# Patient Record
Sex: Female | Born: 1935
Health system: Southern US, Community
[De-identification: ages and names within clinical notes are randomized; demographics above are authoritative.]

## PROBLEM LIST (undated history)

## (undated) DIAGNOSIS — K8020 Calculus of gallbladder without cholecystitis without obstruction: Secondary | ICD-10-CM

## (undated) DIAGNOSIS — N649 Disorder of breast, unspecified: Secondary | ICD-10-CM

## (undated) DIAGNOSIS — M1990 Unspecified osteoarthritis, unspecified site: Secondary | ICD-10-CM

## (undated) DIAGNOSIS — I2510 Atherosclerotic heart disease of native coronary artery without angina pectoris: Secondary | ICD-10-CM

## (undated) DIAGNOSIS — K219 Gastro-esophageal reflux disease without esophagitis: Secondary | ICD-10-CM

## (undated) DIAGNOSIS — B029 Zoster without complications: Secondary | ICD-10-CM

## (undated) DIAGNOSIS — K449 Diaphragmatic hernia without obstruction or gangrene: Secondary | ICD-10-CM

## (undated) DIAGNOSIS — M5137 Other intervertebral disc degeneration, lumbosacral region: Secondary | ICD-10-CM

## (undated) DIAGNOSIS — I700 Atherosclerosis of aorta: Secondary | ICD-10-CM

## (undated) DIAGNOSIS — I340 Nonrheumatic mitral (valve) insufficiency: Secondary | ICD-10-CM

## (undated) DIAGNOSIS — Z8619 Personal history of other infectious and parasitic diseases: Secondary | ICD-10-CM

## (undated) DIAGNOSIS — M8080XA Other osteoporosis with current pathological fracture, unspecified site, initial encounter for fracture: Secondary | ICD-10-CM

## (undated) DIAGNOSIS — F419 Anxiety disorder, unspecified: Secondary | ICD-10-CM

## (undated) DIAGNOSIS — I739 Peripheral vascular disease, unspecified: Secondary | ICD-10-CM

## (undated) DIAGNOSIS — I4819 Other persistent atrial fibrillation: Secondary | ICD-10-CM

## (undated) DIAGNOSIS — E785 Hyperlipidemia, unspecified: Secondary | ICD-10-CM

## (undated) DIAGNOSIS — M069 Rheumatoid arthritis, unspecified: Secondary | ICD-10-CM

## (undated) DIAGNOSIS — I10 Essential (primary) hypertension: Secondary | ICD-10-CM

## (undated) DIAGNOSIS — C4491 Basal cell carcinoma of skin, unspecified: Secondary | ICD-10-CM

## (undated) DIAGNOSIS — G2581 Restless legs syndrome: Secondary | ICD-10-CM

## (undated) DIAGNOSIS — R079 Chest pain, unspecified: Secondary | ICD-10-CM

## (undated) HISTORY — DX: Basal cell carcinoma of skin, unspecified: C44.91

## (undated) HISTORY — DX: Essential (primary) hypertension: I10

## (undated) HISTORY — DX: Chest pain, unspecified: R07.9

## (undated) HISTORY — DX: Other intervertebral disc degeneration, lumbosacral region: M51.37

## (undated) HISTORY — DX: Peripheral vascular disease, unspecified: I73.9

## (undated) HISTORY — PX: CARDIAC CATHETERIZATION: SHX172

## (undated) HISTORY — DX: Hyperlipidemia, unspecified: E78.5

## (undated) HISTORY — DX: Zoster without complications: B02.9

## (undated) HISTORY — PX: ABDOMINAL HYSTERECTOMY: SHX81

## (undated) HISTORY — DX: Diaphragmatic hernia without obstruction or gangrene: K44.9

## (undated) HISTORY — DX: Gastro-esophageal reflux disease without esophagitis: K21.9

## (undated) HISTORY — DX: Personal history of other infectious and parasitic diseases: Z86.19

## (undated) HISTORY — DX: Disorder of breast, unspecified: N64.9

## (undated) HISTORY — DX: Other osteoporosis with current pathological fracture, unspecified site, initial encounter for fracture: M80.80XA

## (undated) HISTORY — DX: Atherosclerotic heart disease of native coronary artery without angina pectoris: I25.10

## (undated) HISTORY — DX: Restless legs syndrome: G25.81

## (undated) HISTORY — DX: Calculus of gallbladder without cholecystitis without obstruction: K80.20

## (undated) HISTORY — DX: Nonrheumatic mitral (valve) insufficiency: I34.0

## (undated) HISTORY — DX: Atherosclerosis of aorta: I70.0

## (undated) HISTORY — DX: Anxiety disorder, unspecified: F41.9

## (undated) HISTORY — DX: Unspecified osteoarthritis, unspecified site: M19.90

## (undated) HISTORY — DX: Rheumatoid arthritis, unspecified: M06.9

## (undated) HISTORY — DX: Other persistent atrial fibrillation: I48.19

---

## 1987-08-09 ENCOUNTER — Encounter: Payer: Self-pay | Admitting: Family Medicine

## 1987-08-09 LAB — CONVERTED CEMR LAB: Pap Smear: NORMAL

## 1988-04-08 DIAGNOSIS — I10 Essential (primary) hypertension: Secondary | ICD-10-CM

## 1988-04-08 HISTORY — DX: Essential (primary) hypertension: I10

## 1989-08-21 DIAGNOSIS — M1990 Unspecified osteoarthritis, unspecified site: Secondary | ICD-10-CM

## 1989-08-21 HISTORY — DX: Unspecified osteoarthritis, unspecified site: M19.90

## 1990-04-08 HISTORY — PX: CERVICAL DISCECTOMY: SHX98

## 1991-01-06 ENCOUNTER — Encounter: Payer: Self-pay | Admitting: Family Medicine

## 1991-01-06 LAB — CONVERTED CEMR LAB: Pap Smear: NORMAL

## 1992-12-06 DIAGNOSIS — M51379 Other intervertebral disc degeneration, lumbosacral region without mention of lumbar back pain or lower extremity pain: Secondary | ICD-10-CM

## 1992-12-06 DIAGNOSIS — M5137 Other intervertebral disc degeneration, lumbosacral region: Secondary | ICD-10-CM

## 1992-12-06 HISTORY — DX: Other intervertebral disc degeneration, lumbosacral region without mention of lumbar back pain or lower extremity pain: M51.379

## 1992-12-06 HISTORY — DX: Other intervertebral disc degeneration, lumbosacral region: M51.37

## 1992-12-20 ENCOUNTER — Encounter: Payer: Self-pay | Admitting: Family Medicine

## 1997-09-08 ENCOUNTER — Emergency Department (HOSPITAL_COMMUNITY): Admission: EM | Admit: 1997-09-08 | Discharge: 1997-09-08 | Payer: Self-pay | Admitting: Emergency Medicine

## 1997-11-06 DIAGNOSIS — M8080XA Other osteoporosis with current pathological fracture, unspecified site, initial encounter for fracture: Secondary | ICD-10-CM

## 1997-11-06 HISTORY — DX: Other osteoporosis with current pathological fracture, unspecified site, initial encounter for fracture: M80.80XA

## 1999-05-10 DIAGNOSIS — K219 Gastro-esophageal reflux disease without esophagitis: Secondary | ICD-10-CM

## 1999-05-10 HISTORY — DX: Gastro-esophageal reflux disease without esophagitis: K21.9

## 1999-05-16 ENCOUNTER — Encounter: Payer: Self-pay | Admitting: Family Medicine

## 1999-05-16 LAB — CONVERTED CEMR LAB: Blood Glucose, Fasting: 83 mg/dL

## 1999-05-23 ENCOUNTER — Other Ambulatory Visit: Admission: RE | Admit: 1999-05-23 | Discharge: 1999-05-23 | Payer: Self-pay | Admitting: Family Medicine

## 1999-05-28 ENCOUNTER — Encounter: Payer: Self-pay | Admitting: Family Medicine

## 1999-09-12 ENCOUNTER — Encounter: Payer: Self-pay | Admitting: Family Medicine

## 1999-09-12 ENCOUNTER — Encounter: Admission: RE | Admit: 1999-09-12 | Discharge: 1999-09-12 | Payer: Self-pay | Admitting: Family Medicine

## 2000-01-09 ENCOUNTER — Encounter: Payer: Self-pay | Admitting: Family Medicine

## 2000-01-09 LAB — CONVERTED CEMR LAB: Blood Glucose, Fasting: 82 mg/dL

## 2000-07-14 ENCOUNTER — Encounter: Payer: Self-pay | Admitting: Family Medicine

## 2000-09-17 ENCOUNTER — Encounter: Admission: RE | Admit: 2000-09-17 | Discharge: 2000-09-17 | Payer: Self-pay | Admitting: Family Medicine

## 2000-09-17 ENCOUNTER — Encounter: Payer: Self-pay | Admitting: Family Medicine

## 2002-01-01 HISTORY — PX: ESOPHAGOGASTRODUODENOSCOPY: SHX1529

## 2002-08-20 ENCOUNTER — Encounter: Payer: Self-pay | Admitting: Family Medicine

## 2002-08-20 LAB — CONVERTED CEMR LAB
Blood Glucose, Fasting: 103 mg/dL
TSH: 2.1 microintl units/mL

## 2002-08-26 ENCOUNTER — Encounter: Payer: Self-pay | Admitting: Family Medicine

## 2002-08-26 ENCOUNTER — Encounter: Admission: RE | Admit: 2002-08-26 | Discharge: 2002-08-26 | Payer: Self-pay | Admitting: Family Medicine

## 2002-08-27 LAB — HM DEXA SCAN

## 2003-12-08 DIAGNOSIS — E785 Hyperlipidemia, unspecified: Secondary | ICD-10-CM

## 2003-12-08 HISTORY — DX: Hyperlipidemia, unspecified: E78.5

## 2003-12-27 ENCOUNTER — Encounter: Payer: Self-pay | Admitting: Family Medicine

## 2003-12-27 LAB — CONVERTED CEMR LAB: TSH: 1.25 microintl units/mL

## 2004-02-02 ENCOUNTER — Encounter: Admission: RE | Admit: 2004-02-02 | Discharge: 2004-02-02 | Payer: Self-pay | Admitting: Family Medicine

## 2004-02-07 ENCOUNTER — Encounter: Admission: RE | Admit: 2004-02-07 | Discharge: 2004-02-07 | Payer: Self-pay | Admitting: Family Medicine

## 2004-02-22 ENCOUNTER — Ambulatory Visit (HOSPITAL_COMMUNITY): Admission: RE | Admit: 2004-02-22 | Discharge: 2004-02-22 | Payer: Self-pay | Admitting: Gastroenterology

## 2004-02-22 ENCOUNTER — Ambulatory Visit: Payer: Self-pay | Admitting: Gastroenterology

## 2004-04-17 ENCOUNTER — Ambulatory Visit: Payer: Self-pay | Admitting: Family Medicine

## 2004-04-20 ENCOUNTER — Ambulatory Visit: Payer: Self-pay | Admitting: Family Medicine

## 2004-04-23 HISTORY — PX: OTHER SURGICAL HISTORY: SHX169

## 2004-04-23 HISTORY — PX: ESOPHAGOGASTRODUODENOSCOPY: SHX1529

## 2004-11-12 ENCOUNTER — Ambulatory Visit: Payer: Self-pay | Admitting: Family Medicine

## 2004-12-14 ENCOUNTER — Ambulatory Visit: Payer: Self-pay | Admitting: Family Medicine

## 2005-05-16 ENCOUNTER — Ambulatory Visit: Payer: Self-pay | Admitting: Family Medicine

## 2005-05-17 ENCOUNTER — Encounter: Payer: Self-pay | Admitting: Family Medicine

## 2005-05-17 LAB — CONVERTED CEMR LAB: Blood Glucose, Fasting: 97 mg/dL

## 2005-06-03 ENCOUNTER — Ambulatory Visit: Payer: Self-pay | Admitting: Family Medicine

## 2005-06-03 LAB — CONVERTED CEMR LAB: TSH: 0.98 microintl units/mL

## 2005-06-05 ENCOUNTER — Encounter: Payer: Self-pay | Admitting: Family Medicine

## 2005-06-05 ENCOUNTER — Other Ambulatory Visit: Admission: RE | Admit: 2005-06-05 | Discharge: 2005-06-05 | Payer: Self-pay | Admitting: Family Medicine

## 2005-06-05 ENCOUNTER — Ambulatory Visit: Payer: Self-pay | Admitting: Family Medicine

## 2005-06-05 LAB — CONVERTED CEMR LAB: Pap Smear: NORMAL

## 2005-06-13 ENCOUNTER — Encounter: Admission: RE | Admit: 2005-06-13 | Discharge: 2005-06-13 | Payer: Self-pay | Admitting: Family Medicine

## 2005-07-03 ENCOUNTER — Ambulatory Visit: Payer: Self-pay | Admitting: Family Medicine

## 2005-07-19 ENCOUNTER — Encounter: Admission: RE | Admit: 2005-07-19 | Discharge: 2005-07-19 | Payer: Self-pay | Admitting: Family Medicine

## 2005-09-03 ENCOUNTER — Ambulatory Visit: Payer: Self-pay | Admitting: Family Medicine

## 2006-06-03 ENCOUNTER — Ambulatory Visit: Payer: Self-pay | Admitting: Family Medicine

## 2006-06-03 LAB — CONVERTED CEMR LAB
ALT: 13 units/L (ref 0–40)
Albumin: 3.8 g/dL (ref 3.5–5.2)
Alkaline Phosphatase: 55 units/L (ref 39–117)
BUN: 7 mg/dL (ref 6–23)
Blood Glucose, Fasting: 97 mg/dL
CO2: 31 meq/L (ref 19–32)
Calcium: 9.3 mg/dL (ref 8.4–10.5)
Chloride: 103 meq/L (ref 96–112)
Cholesterol: 230 mg/dL (ref 0–200)
Eosinophils Absolute: 0.2 10*3/uL (ref 0.0–0.6)
Eosinophils Relative: 5.1 % — ABNORMAL HIGH (ref 0.0–5.0)
HCT: 39.9 % (ref 36.0–46.0)
Hemoglobin: 13.5 g/dL (ref 12.0–15.0)
MCHC: 33.8 g/dL (ref 30.0–36.0)
Monocytes Absolute: 0.5 10*3/uL (ref 0.2–0.7)
Neutro Abs: 2.1 10*3/uL (ref 1.4–7.7)
RBC count: 4.41 10*6/uL
RBC: 4.41 M/uL (ref 3.87–5.11)
RDW: 11.9 % (ref 11.5–14.6)
Total CHOL/HDL Ratio: 4.2
Total Protein: 6.8 g/dL (ref 6.0–8.3)
Triglycerides: 235 mg/dL (ref 0–149)

## 2006-06-05 ENCOUNTER — Encounter: Payer: Self-pay | Admitting: Family Medicine

## 2006-06-05 LAB — CONVERTED CEMR LAB: WBC, blood: 4.9 10*3/uL

## 2006-06-10 ENCOUNTER — Ambulatory Visit: Payer: Self-pay | Admitting: Family Medicine

## 2006-06-17 ENCOUNTER — Encounter: Admission: RE | Admit: 2006-06-17 | Discharge: 2006-06-17 | Payer: Self-pay | Admitting: Family Medicine

## 2006-06-30 ENCOUNTER — Ambulatory Visit: Payer: Self-pay | Admitting: Family Medicine

## 2006-07-01 ENCOUNTER — Ambulatory Visit: Payer: Self-pay | Admitting: Family Medicine

## 2006-11-24 ENCOUNTER — Encounter: Payer: Self-pay | Admitting: Family Medicine

## 2006-11-24 DIAGNOSIS — K219 Gastro-esophageal reflux disease without esophagitis: Secondary | ICD-10-CM | POA: Insufficient documentation

## 2006-11-24 DIAGNOSIS — K259 Gastric ulcer, unspecified as acute or chronic, without hemorrhage or perforation: Secondary | ICD-10-CM | POA: Insufficient documentation

## 2006-11-24 DIAGNOSIS — G4761 Periodic limb movement disorder: Secondary | ICD-10-CM

## 2006-11-24 DIAGNOSIS — M8000XA Age-related osteoporosis with current pathological fracture, unspecified site, initial encounter for fracture: Secondary | ICD-10-CM

## 2006-11-24 DIAGNOSIS — M1990 Unspecified osteoarthritis, unspecified site: Secondary | ICD-10-CM

## 2006-11-24 DIAGNOSIS — I10 Essential (primary) hypertension: Secondary | ICD-10-CM

## 2006-11-24 DIAGNOSIS — E785 Hyperlipidemia, unspecified: Secondary | ICD-10-CM

## 2007-04-09 DIAGNOSIS — Z8619 Personal history of other infectious and parasitic diseases: Secondary | ICD-10-CM

## 2007-04-09 HISTORY — DX: Personal history of other infectious and parasitic diseases: Z86.19

## 2007-04-12 ENCOUNTER — Ambulatory Visit: Payer: Self-pay | Admitting: Family Medicine

## 2007-04-13 ENCOUNTER — Ambulatory Visit: Payer: Self-pay | Admitting: Family Medicine

## 2007-04-13 DIAGNOSIS — B029 Zoster without complications: Secondary | ICD-10-CM | POA: Insufficient documentation

## 2007-04-13 HISTORY — DX: Zoster without complications: B02.9

## 2007-04-20 ENCOUNTER — Ambulatory Visit: Payer: Self-pay | Admitting: Family Medicine

## 2007-04-20 DIAGNOSIS — B0229 Other postherpetic nervous system involvement: Secondary | ICD-10-CM

## 2007-04-20 HISTORY — DX: Other postherpetic nervous system involvement: B02.29

## 2007-05-01 ENCOUNTER — Telehealth (INDEPENDENT_AMBULATORY_CARE_PROVIDER_SITE_OTHER): Payer: Self-pay | Admitting: Internal Medicine

## 2007-05-04 ENCOUNTER — Telehealth (INDEPENDENT_AMBULATORY_CARE_PROVIDER_SITE_OTHER): Payer: Self-pay | Admitting: *Deleted

## 2007-05-11 ENCOUNTER — Ambulatory Visit: Payer: Self-pay | Admitting: Family Medicine

## 2007-05-26 ENCOUNTER — Ambulatory Visit: Payer: Self-pay | Admitting: Family Medicine

## 2007-07-13 ENCOUNTER — Telehealth: Payer: Self-pay | Admitting: Family Medicine

## 2007-07-16 ENCOUNTER — Ambulatory Visit: Payer: Self-pay | Admitting: Family Medicine

## 2007-08-28 ENCOUNTER — Ambulatory Visit: Payer: Self-pay | Admitting: Family Medicine

## 2007-08-28 ENCOUNTER — Encounter (INDEPENDENT_AMBULATORY_CARE_PROVIDER_SITE_OTHER): Payer: Self-pay | Admitting: *Deleted

## 2007-09-23 ENCOUNTER — Ambulatory Visit: Payer: Self-pay | Admitting: Family Medicine

## 2007-09-23 LAB — CONVERTED CEMR LAB
BUN: 8 mg/dL (ref 6–23)
Basophils Absolute: 0 10*3/uL (ref 0.0–0.1)
CO2: 32 meq/L (ref 19–32)
Calcium: 9.6 mg/dL (ref 8.4–10.5)
Chloride: 103 meq/L (ref 96–112)
Creatinine, Ser: 0.8 mg/dL (ref 0.4–1.2)
Eosinophils Absolute: 0.3 10*3/uL (ref 0.0–0.7)
GFR calc Af Amer: 90 mL/min
Glucose, Bld: 102 mg/dL — ABNORMAL HIGH (ref 70–99)
HCT: 40.5 % (ref 36.0–46.0)
HDL: 56.2 mg/dL (ref >39.0)
Hemoglobin: 13.8 g/dL (ref 12.0–15.0)
MCHC: 34.1 g/dL (ref 30.0–36.0)
MCV: 92.6 fL (ref 78.0–100.0)
RBC: 4.38 M/uL (ref 3.87–5.11)
TSH: 1.65 microintl units/mL (ref 0.35–5.50)
Total Bilirubin: 0.6 mg/dL (ref 0.3–1.2)
Total Protein: 7 g/dL (ref 6.0–8.3)
Triglycerides: 81 mg/dL (ref 0–149)

## 2007-09-29 ENCOUNTER — Ambulatory Visit: Payer: Self-pay | Admitting: Family Medicine

## 2007-12-10 ENCOUNTER — Ambulatory Visit: Payer: Self-pay | Admitting: Family Medicine

## 2007-12-10 DIAGNOSIS — F339 Major depressive disorder, recurrent, unspecified: Secondary | ICD-10-CM | POA: Insufficient documentation

## 2007-12-23 ENCOUNTER — Ambulatory Visit: Payer: Self-pay | Admitting: Family Medicine

## 2007-12-23 LAB — CONVERTED CEMR LAB
Basophils Absolute: 0.1 10*3/uL (ref 0.0–0.1)
Eosinophils Absolute: 0.2 10*3/uL (ref 0.0–0.7)
Eosinophils Relative: 4.7 % (ref 0.0–5.0)
Lymphocytes Relative: 37.5 % (ref 12.0–46.0)
Monocytes Absolute: 0.5 10*3/uL (ref 0.1–1.0)
Neutro Abs: 2.3 10*3/uL (ref 1.4–7.7)
Neutrophils Relative %: 47.4 % (ref 43.0–77.0)
Vitamin B-12: 286 pg/mL (ref 211–911)

## 2007-12-24 LAB — CONVERTED CEMR LAB: Vit D, 1,25-Dihydroxy: 29 — ABNORMAL LOW (ref 30–89)

## 2007-12-29 ENCOUNTER — Ambulatory Visit: Payer: Self-pay | Admitting: Family Medicine

## 2008-01-05 ENCOUNTER — Telehealth: Payer: Self-pay | Admitting: Family Medicine

## 2008-01-23 ENCOUNTER — Telehealth: Payer: Self-pay | Admitting: Internal Medicine

## 2008-02-29 ENCOUNTER — Ambulatory Visit: Payer: Self-pay | Admitting: Family Medicine

## 2008-03-15 ENCOUNTER — Telehealth: Payer: Self-pay | Admitting: Family Medicine

## 2008-08-08 ENCOUNTER — Telehealth: Payer: Self-pay | Admitting: Family Medicine

## 2008-12-14 ENCOUNTER — Ambulatory Visit: Payer: Self-pay | Admitting: Family Medicine

## 2008-12-14 LAB — CONVERTED CEMR LAB
AST: 19 units/L (ref 0–37)
Basophils Absolute: 0 10*3/uL (ref 0.0–0.1)
Bilirubin, Direct: 0 mg/dL (ref 0.0–0.3)
Chloride: 104 meq/L (ref 96–112)
Cholesterol: 223 mg/dL — ABNORMAL HIGH (ref 0–200)
Creatinine, Ser: 0.8 mg/dL (ref 0.4–1.2)
Direct LDL: 119.2 mg/dL
Eosinophils Absolute: 0.3 10*3/uL (ref 0.0–0.7)
HCT: 40 % (ref 36.0–46.0)
Iron: 54 ug/dL (ref 42–145)
Lymphs Abs: 2.1 10*3/uL (ref 0.7–4.0)
MCV: 93.4 fL (ref 78.0–100.0)
Neutrophils Relative %: 40.9 % — ABNORMAL LOW (ref 43.0–77.0)
Sodium: 142 meq/L (ref 135–145)
TSH: 1.51 microintl units/mL (ref 0.35–5.50)
Total Bilirubin: 0.7 mg/dL (ref 0.3–1.2)
Triglycerides: 242 mg/dL — ABNORMAL HIGH (ref 0.0–149.0)
VLDL: 48.4 mg/dL — ABNORMAL HIGH (ref 0.0–40.0)
WBC: 4.9 10*3/uL (ref 4.5–10.5)

## 2008-12-16 LAB — CONVERTED CEMR LAB: Vit D, 25-Hydroxy: 28 ng/mL — ABNORMAL LOW (ref 30–89)

## 2008-12-22 ENCOUNTER — Telehealth (INDEPENDENT_AMBULATORY_CARE_PROVIDER_SITE_OTHER): Payer: Self-pay | Admitting: Internal Medicine

## 2009-01-03 ENCOUNTER — Ambulatory Visit: Payer: Self-pay | Admitting: Family Medicine

## 2009-02-02 ENCOUNTER — Ambulatory Visit: Payer: Self-pay | Admitting: Family Medicine

## 2009-02-02 ENCOUNTER — Encounter (INDEPENDENT_AMBULATORY_CARE_PROVIDER_SITE_OTHER): Payer: Self-pay | Admitting: *Deleted

## 2009-02-02 LAB — CONVERTED CEMR LAB: OCCULT 2: NEGATIVE

## 2009-07-12 ENCOUNTER — Ambulatory Visit: Payer: Self-pay | Admitting: Family Medicine

## 2009-10-30 ENCOUNTER — Telehealth: Payer: Self-pay | Admitting: Family Medicine

## 2009-10-31 ENCOUNTER — Ambulatory Visit: Payer: Self-pay | Admitting: Family Medicine

## 2009-11-14 ENCOUNTER — Encounter (INDEPENDENT_AMBULATORY_CARE_PROVIDER_SITE_OTHER): Payer: Self-pay | Admitting: *Deleted

## 2009-12-18 ENCOUNTER — Ambulatory Visit: Payer: Self-pay | Admitting: Family Medicine

## 2009-12-18 DIAGNOSIS — M5136 Other intervertebral disc degeneration, lumbar region: Secondary | ICD-10-CM

## 2010-01-19 ENCOUNTER — Telehealth: Payer: Self-pay | Admitting: Family Medicine

## 2010-01-22 ENCOUNTER — Telehealth: Payer: Self-pay | Admitting: Family Medicine

## 2010-02-19 ENCOUNTER — Ambulatory Visit: Payer: Self-pay | Admitting: Family Medicine

## 2010-02-20 ENCOUNTER — Telehealth (INDEPENDENT_AMBULATORY_CARE_PROVIDER_SITE_OTHER): Payer: Self-pay | Admitting: *Deleted

## 2010-02-20 LAB — CONVERTED CEMR LAB
AST: 24 units/L (ref 0–37)
Albumin: 4.3 g/dL (ref 3.5–5.2)
Alkaline Phosphatase: 65 units/L (ref 39–117)
BUN: 13 mg/dL (ref 6–23)
Basophils Absolute: 0 10*3/uL (ref 0.0–0.1)
Basophils Relative: 0.7 % (ref 0.0–3.0)
CO2: 29 meq/L (ref 19–32)
Chloride: 102 meq/L (ref 96–112)
Cholesterol: 267 mg/dL — ABNORMAL HIGH (ref 0–200)
Direct LDL: 173.1 mg/dL
Eosinophils Relative: 6 % — ABNORMAL HIGH (ref 0.0–5.0)
HCT: 43.1 % (ref 36.0–46.0)
Hemoglobin: 14.8 g/dL (ref 12.0–15.0)
Lymphs Abs: 2.1 10*3/uL (ref 0.7–4.0)
MCV: 93.1 fL (ref 78.0–100.0)
Monocytes Absolute: 0.6 10*3/uL (ref 0.1–1.0)
Platelets: 299 10*3/uL (ref 150.0–400.0)
Potassium: 3.8 meq/L (ref 3.5–5.1)
RBC: 4.64 M/uL (ref 3.87–5.11)
Sodium: 139 meq/L (ref 135–145)
TSH: 1.51 microintl units/mL (ref 0.35–5.50)
Total Bilirubin: 0.7 mg/dL (ref 0.3–1.2)
Total CHOL/HDL Ratio: 4
Total Protein: 7 g/dL (ref 6.0–8.3)
Triglycerides: 91 mg/dL (ref 0.0–149.0)
Vit D, 25-Hydroxy: 38 ng/mL (ref 30–89)

## 2010-02-22 ENCOUNTER — Ambulatory Visit: Payer: Self-pay | Admitting: Family Medicine

## 2010-03-06 ENCOUNTER — Ambulatory Visit: Payer: Self-pay | Admitting: Family Medicine

## 2010-03-06 LAB — FECAL OCCULT BLOOD, GUAIAC: Fecal Occult Blood: NEGATIVE

## 2010-03-06 LAB — CONVERTED CEMR LAB: Fecal Occult Bld: NEGATIVE

## 2010-03-07 ENCOUNTER — Encounter: Admission: RE | Admit: 2010-03-07 | Discharge: 2010-03-07 | Payer: Self-pay | Admitting: Family Medicine

## 2010-03-07 LAB — HM MAMMOGRAPHY: HM Mammogram: NORMAL

## 2010-03-23 ENCOUNTER — Telehealth: Payer: Self-pay | Admitting: Family Medicine

## 2010-04-04 ENCOUNTER — Ambulatory Visit: Payer: Self-pay | Admitting: Family Medicine

## 2010-04-04 LAB — CONVERTED CEMR LAB: ALT: 20 units/L (ref 0–35)

## 2010-04-29 ENCOUNTER — Encounter: Payer: Self-pay | Admitting: Family Medicine

## 2010-05-09 HISTORY — PX: CATARACT EXTRACTION: SUR2

## 2010-05-10 NOTE — Assessment & Plan Note (Signed)
Summary: CPX/CLE   Vital Signs:  Patient profile:   77 year old female Weight:      151.75 pounds Temp:     97.4 degrees F oral Pulse rate:   80 / minute Pulse rhythm:   regular BP sitting:   134 / 84  (left arm) Cuff size:   regular  Vitals Entered By: Sydell Axon LPN (February 22, 2010 1:55 PM) CC: 30 minute checkup, has had a hysterectomy   History of Present Illness: Pt here for Comp Exam. She complains of not sleeping well. She is stressed out, has a best friend who was just found to have cancer, her heater in the home needs replacing...another 500 dollars. Nephew died at 48 yoa weith kidney and pancreatic disease. Her back is improving. She is swallowing well.  Preventive Screening-Counseling & Management  Alcohol-Tobacco     Alcohol drinks/day: <1     Alcohol type: rare occas wine     Smoking Status: never     Passive Smoke Exposure: no  Caffeine-Diet-Exercise     Caffeine use/day: 2     Does Patient Exercise: yes     Type of exercise: works hard, walks     Times/week: 7  Problems Prior to Update: 1)  Back Pain  (ICD-724.5) 2)  Hoarseness Since 10/10  (ICD-784.42) 3)  Leg Pain, Bilateral, Nighttime  (ICD-729.5) 4)  Other Malaise and Fatigue  (ICD-780.79) 5)  Depressive Disorder  (ICD-311) 6)  Health Maintenance Exam  (ICD-V70.0) 7)  Special Screening Malig Neoplasms Other Sites  (ICD-V76.49) 8)  Postherpetic Neuralgia  (ICD-053.19) 9)  Herpes Zoster  (ICD-053.9) 10)  Periodic Limb Movement Disorder  (ICD-333.99) 11)  Gastric Ulcer  (ICD-531.90) 12)  Osteoporosis  (ICD-733.00) 13)  Osteoarthritis  (ICD-715.90) 14)  Hypertension  (ICD-401.9) 15)  Hyperlipidemia  (ICD-272.4) 16)  Gerd  (ICD-530.81)  Medications Prior to Update: 1)  Carbidopa-Levodopa 25-100 Mg Tabs (Carbidopa-Levodopa) .Marland Kitchen.. 1 Daily By Mouth 2)  Hydrochlorothiazide 12.5 Mg Tabs (Hydrochlorothiazide) .... Take 1 Tablet By Mouth Once A Day 3)  Amlodipine Besylate 10 Mg Tabs (Amlodipine  Besylate) .Marland Kitchen.. 1 Daily By Mouth 4)  Pantoprazole Sodium 40 Mg Tbec (Pantoprazole Sodium) .... Take 1 Tablet By Mouth Once A Day 5)  Calcium 500/d 500-200 Mg-Unit  Tabs (Calcium Carbonate-Vitamin D) .Marland KitchenMarland KitchenMarland Kitchen 1twice A Day By Mouth 6)  Fish Oil 1200 Mg Caps (Omega-3 Fatty Acids) .Marland Kitchen.. 1 Tablet Twice A Day 7)  Acyclovir 400 Mg Tabs (Acyclovir) .Marland Kitchen.. 1 Tablet Twice A Day By Mouth 8)  Glucosamine Chondr 1500 Complx  Caps (Glucosamine-Chondroit-Vit C-Mn) .Marland Kitchen.. 1 Daily By Mouth 9)  Omnipred 1 % Susp (Prednisolone Acetate) .... As Needed As Directed 10)  Vitamin D 1000 Unit Caps (Cholecalciferol) .Marland Kitchen.. 1 Tablet Twice A Day 11)  Cvs Allergy Relief 10 Mg Tabs (Loratadine) .... Take 1 Tablet By Mouth Once A Day 12)  Ibuprofen 200 Mg Tabs (Ibuprofen) .... 3-4 By Mouth With Food Up To Three Times A Day As Needed Back Pain 13)  B-Caro-T 15 Mg Caps (Beta Carotene) .... Take 1 Capsule By Mouth Once A Day 14)  Venlafaxine Hcl 37.5 Mg Tabs (Venlafaxine Hcl) .Marland Kitchen.. 1 By Mouth Once Daily For 1 Week Then Two Times A Day 15)  Flexeril 10 Mg Tabs (Cyclobenzaprine Hcl) .Marland Kitchen.. 1 By Mouth Up To Three Times A Day As Needed Back Pain  Watch Out For Sedation  Current Medications (verified): 1)  Carbidopa-Levodopa 25-100 Mg Tabs (Carbidopa-Levodopa) .Marland Kitchen.. 1 Daily By Mouth 2)  Hydrochlorothiazide  12.5 Mg Tabs (Hydrochlorothiazide) .... Take 1 Tablet By Mouth Once A Day 3)  Amlodipine Besylate 10 Mg Tabs (Amlodipine Besylate) .Marland Kitchen.. 1 Daily By Mouth 4)  Pantoprazole Sodium 40 Mg Tbec (Pantoprazole Sodium) .... Take 1 Tablet By Mouth Once A Day 5)  Calcium 500/d 500-200 Mg-Unit  Tabs (Calcium Carbonate-Vitamin D) .Marland KitchenMarland KitchenMarland Kitchen 1twice A Day By Mouth 6)  Fish Oil 1200 Mg Caps (Omega-3 Fatty Acids) .Marland Kitchen.. 1 Tablet Twice A Day 7)  Acyclovir 400 Mg Tabs (Acyclovir) .Marland Kitchen.. 1 Tablet Daily By Mouth 8)  Glucosamine Chondr 1500 Complx  Caps (Glucosamine-Chondroit-Vit C-Mn) .Marland Kitchen.. 1 Daily By Mouth 9)  Omnipred 1 % Susp (Prednisolone Acetate) .... As Needed As  Directed 10)  Vitamin D 1000 Unit Caps (Cholecalciferol) .Marland Kitchen.. 1 Tablet Twice A Day 11)  Cvs Allergy Relief 10 Mg Tabs (Loratadine) .... Take 1 Tablet By Mouth Once A Day 12)  Ibuprofen 200 Mg Tabs (Ibuprofen) .... 3-4 By Mouth With Food Up To Three Times A Day As Needed Back Pain 13)  B-Caro-T 15 Mg Caps (Beta Carotene) .... Take 1 Capsule By Mouth Once A Day 14)  Venlafaxine Hcl 37.5 Mg Tabs (Venlafaxine Hcl) .... Take By Mouth  Two Times A Day 15)  Flexeril 10 Mg Tabs (Cyclobenzaprine Hcl) .Marland Kitchen.. 1 By Mouth Up To Three Times A Day As Needed Back Pain  Watch Out For Sedation 16)  Melatonin 3 Mg Tabs (Melatonin) .... Take By Mouth At Bedtime 17)  Aleve 220 Mg Tabs (Naproxen Sodium) .... As Needed  Allergies: 1)  ! Penicillin  Past History:  Past Medical History: Last updated: 11/24/2006 GERD:05/1999 Hyperlipidemia :12/2003 Hypertension: 1990 Osteoarthritis:  08/21/1989 Osteoporosis 11/1997  Past Surgical History: Last updated: 11/24/2006 DEGENERATIVE DISC DISEASE L/S SPINE :(12/06/1992) HYSTERECTOMY AT AGE 7:// S/P ZOX(0960) NECK DISKECTOMY / FUSION:(DR BOTERO) (1992) EGD WITH ULCER; BX NEGATIVE; +STRICTURE GASTRIC ULCER WITH HEMMORAGE:(01/01/2002) DEXA- OSTEOPENIA MILDLY WORSENED SINCE 11/1997:(08/27/2002) EGD ESOPH STRICTURE -- DILATED:(04/23/2004) ABD. ULTRASOUND , GALLSTONES:(04/23/2004)  Family History: Last updated: Mar 19, 2010 Father: DECEASED AT 100YOA MI; BLACK LUNG DISEASE Mother:  DECEASED 73 YOA EMPHYSEMA, LUNG DISEASE Siblings: 2 BROTHERS ALL HAVE HTN , ONE WITH HEART               PROBLEMS:ONE DECEASED FROM CVA 1 SISTER WHO HAD CVA AT AGE 86 AND DIED AT 66 YOA, + DM CV: + BROTHER HEART VALVE HTN: + BROTHERS DM+ SISTER GOUT/ARTHRITIS: CANCER: NEGATIVE COLON AND BREAST? 1 BROTHER BRAIN TUMOR/ 1 BROTHER ESOPHAGUS CANCER  Social History: Last updated: 10/31/2009 Marital Status: Widowed 01/1999 ; 1ST MARRIAGE 25 YEARS/ 2ND MARRIAGE 13 YEARS Children: 4 SONS    1DECEASED 10/09 / 1 DAUGHTER Occupation: retired from [employer] From [place]  Risk Factors: Alcohol Use: <1 (03/19/10) Caffeine Use: 2 (2010/03/19) Exercise: yes (19-Mar-2010)  Risk Factors: Smoking Status: never (03-19-2010) Passive Smoke Exposure: no (Mar 19, 2010)  Family History: Father: DECEASED AT 76YOA MI; BLACK LUNG DISEASE Mother:  DECEASED 10 YOA EMPHYSEMA, LUNG DISEASE Siblings: 2 BROTHERS ALL HAVE HTN , ONE WITH HEART               PROBLEMS:ONE DECEASED FROM CVA 1 SISTER WHO HAD CVA AT AGE 86 AND DIED AT 24 YOA, + DM CV: + BROTHER HEART VALVE HTN: + BROTHERS DM+ SISTER GOUT/ARTHRITIS: CANCER: NEGATIVE COLON AND BREAST? 1 BROTHER BRAIN TUMOR/ 1 BROTHER ESOPHAGUS CANCER  Review of Systems General:  Denies chills, fatigue, fever, sweats, weakness, and weight loss. Eyes:  Denies blurring, discharge, and eye pain; h/o shingles. ENT:  Denies decreased hearing, ear discharge, earache, and ringing in ears. CV:  Complains of palpitations and shortness of breath with exertion; denies chest pain or discomfort, fainting, fatigue, swelling of feet, and swelling of hands; rare. Resp:  Denies cough, shortness of breath, and wheezing. GI:  Denies abdominal pain, bloody stools, change in bowel habits, constipation, dark tarry stools, diarrhea, indigestion, loss of appetite, nausea, vomiting, vomiting blood, and yellowish skin color. GU:  Complains of nocturia; denies discharge, dysuria, and urinary frequency. MS:  Complains of low back pain; denies joint pain, muscle aches, cramps, and stiffness; acute. Derm:  Denies dryness, itching, and rash. Neuro:  Denies numbness, poor balance, tingling, and tremors.  Physical Exam  General:  Well-developed,well-nourished,in no acute distress; alert,appropriate and cooperative throughout examination Head:  Normocephalic and atraumatic without obvious abnormalities. No apparent alopecia or balding. Eyes:  Conjunctiva clear bilaterally.  Ears:   External ear exam shows no significant lesions or deformities.  Otoscopic examination reveals clear canals, tympanic membranes are intact bilaterally without bulging, retraction, inflammation or discharge. Hearing is grossly normal bilaterally. Nose:  External nasal examination shows no deformity or inflammation. Nasal mucosa are pink and moist without lesions or exudates. Mouth:  pharynx pink and moist.   Neck:  No deformities, masses, or tenderness noted. nl rom  Chest Wall:  No deformities, masses, or tenderness noted. Breasts:  No mass, nodules, thickening, tenderness, bulging, retraction, inflamation, nipple discharge or skin changes noted.   Lungs:  Normal respiratory effort, chest expands symmetrically. Lungs are clear to auscultation, no crackles or wheezes. Heart:  Normal rate and regular rhythm. S1 and S2 normal without gallop, murmur, click, rub or other extra sounds. Abdomen:  no suprapubic tenderness or fullness felt  Rectal:  No external abnormalities noted. Normal sphincter tone. No rectal masses or tenderness. G neg. Genitalia:  Bimanual only done Introitus wnl, Uterus and Cervix absent, Adnexa nontender w/o mass, ovaries not felt.  Msk:  no LS tenderness nl rom hips and knees labored gait   Pulses:  R and L carotid,radial,femoral,dorsalis pedis and posterior tibial pulses are full and equal bilaterally Extremities:  No clubbing, cyanosis, edema, or deformity noted with normal full range of motion of all joints.   Neurologic:  strength normal in all extremities, sensation intact to light touch, and gait normal.   Skin:  Intact without suspicious lesions or rashes Cervical Nodes:  No lymphadenopathy noted Axillary Nodes:  No palpable lymphadenopathy Inguinal Nodes:  No significant adenopathy Psych:  normal affect, talkative and pleasant    Impression & Recommendations:  Problem # 1:  HEALTH MAINTENANCE EXAM (ICD-V70.0) I have personally reviewed the Medicare Annual  Wellness questionnaire and have noted 1.   The patient's medical and social history 2.   Their use of alcohol, tobacco or illicit drugs 3.   Their current medications and supplements 4.   The patient's functional ability including ADL's, fall risks, home safety risks and hearing or visual             impairment. 5.   Diet and physical activities 6.   Evidence for depression or mood disorders  Problem # 2:  BACK PAIN (ICD-724.5) Assessment: Improved  The following medications were removed from the medication list:    Ibuprofen 200 Mg Tabs (Ibuprofen) .Marland Kitchen... 3-4 by mouth with food up to three times a day as needed back pain Her updated medication list for this problem includes:    Flexeril 10 Mg Tabs (Cyclobenzaprine hcl) .Marland Kitchen... 1 by mouth up  to three times a day as needed back pain  watch out for sedation    Aleve 220 Mg Tabs (Naproxen sodium) .Marland Kitchen... 2 tabs after brfst and supper as needed  Discussed use of moist heat or ice, modified activities, medications, and stretching/strengthening exercises. Back care instructions given. To be seen in 2 weeks if no improvement; sooner if worsening of symptoms.   Problem # 3:  LEG PAIN, BILATERAL, NIGHTTIME (ICD-729.5) Stable, unable to afford Requip or Neurontin.  Problem # 4:  DEPRESSIVE DISORDER (ICD-311) Assessment: Improved Brighter and in better spirits today. Her updated medication list for this problem includes:    Venlafaxine Hcl 37.5 Mg Tabs (Venlafaxine hcl) .Marland Kitchen... Take by mouth  two times a day  Problem # 5:  POSTHERPETIC NEURALGIA (ICD-053.19) Assessment: Improved Left with itching of left eyebrow.  Problem # 6:  HYPERTENSION (ICD-401.9) Assessment: Unchanged Stable. Her updated medication list for this problem includes:    Hydrochlorothiazide 12.5 Mg Tabs (Hydrochlorothiazide) .Marland Kitchen... Take 1 tablet by mouth once a day    Amlodipine Besylate 10 Mg Tabs (Amlodipine besylate) .Marland Kitchen... 1 daily by mouth  BP today: 134/84 Prior BP: 130/76  (12/18/2009)  Labs Reviewed: K+: 3.8 (02/19/2010) Creat: : 0.7 (02/19/2010)   Chol: 267 (02/19/2010)   HDL: 75.60 (02/19/2010)   LDL: 114 (09/23/2007)   TG: 91.0 (02/19/2010)  Problem # 7:  HYPERLIPIDEMIA (ICD-272.4) Assessment: Deteriorated  LDL too high, will try Simvastatin to help control. Labs Reviewed: SGOT: 24 (02/19/2010)   SGPT: 20 (02/19/2010)   HDL:75.60 (02/19/2010), 43.20 (12/14/2008)  LDL:114 (09/23/2007), DEL (06/03/2006)  Chol:267 (02/19/2010), 223 (12/14/2008)  Trig:91.0 (02/19/2010), 242.0 (12/14/2008)  Her updated medication list for this problem includes:    Simvastatin 40 Mg Tabs (Simvastatin) ..... One taqb by mouth at night  Problem # 8:  GERD (ICD-530.81) Assessment: Unchanged Stable. Her updated medication list for this problem includes:    Pantoprazole Sodium 40 Mg Tbec (Pantoprazole sodium) .Marland Kitchen... Take 1 tablet by mouth once a day  Complete Medication List: 1)  Carbidopa-levodopa 25-100 Mg Tabs (Carbidopa-levodopa) .Marland Kitchen.. 1 daily by mouth 2)  Hydrochlorothiazide 12.5 Mg Tabs (Hydrochlorothiazide) .... Take 1 tablet by mouth once a day 3)  Amlodipine Besylate 10 Mg Tabs (Amlodipine besylate) .Marland Kitchen.. 1 daily by mouth 4)  Pantoprazole Sodium 40 Mg Tbec (Pantoprazole sodium) .... Take 1 tablet by mouth once a day 5)  Calcium 500/d 500-200 Mg-unit Tabs (Calcium carbonate-vitamin d) .Marland KitchenMarland KitchenMarland Kitchen 1twice a day by mouth 6)  Fish Oil 1200 Mg Caps (Omega-3 fatty acids) .Marland Kitchen.. 1 tablet twice a day 7)  Acyclovir 400 Mg Tabs (Acyclovir) .Marland Kitchen.. 1 tablet daily by mouth 8)  Glucosamine Chondr 1500 Complx Caps (Glucosamine-chondroit-vit c-mn) .Marland Kitchen.. 1 daily by mouth 9)  Omnipred 1 % Susp (Prednisolone acetate) .... As needed as directed 10)  Vitamin D 1000 Unit Caps (Cholecalciferol) .Marland Kitchen.. 1 tablet twice a day 11)  Cvs Allergy Relief 10 Mg Tabs (Loratadine) .... Take 1 tablet by mouth once a day 12)  B-caro-t 15 Mg Caps (Beta carotene) .... Take 1 capsule by mouth once a day 13)  Venlafaxine  Hcl 37.5 Mg Tabs (Venlafaxine hcl) .... Take by mouth  two times a day 14)  Flexeril 10 Mg Tabs (Cyclobenzaprine hcl) .Marland Kitchen.. 1 by mouth up to three times a day as needed back pain  watch out for sedation 15)  Melatonin 3 Mg Tabs (Melatonin) .... Take by mouth at bedtime 16)  Aleve 220 Mg Tabs (Naproxen sodium) .... 2 tabs after brfst and supper  as needed 17)  Simvastatin 40 Mg Tabs (Simvastatin) .... One taqb by mouth at night  Patient Instructions: 1)  SGOT, SGPT 272.4    6 WEEKS 2)  See me in 3 mos, SGOT, SGPT, CHOL PROFILE  272.4     prior 3)  Has new grandson, going to [place] for Thanksgiving to see him. Have a safe trip!! Prescriptions: FLEXERIL 10 MG TABS (CYCLOBENZAPRINE HCL) 1 by mouth up to three times a day as needed back pain  watch out for sedation  #90 x 0   Entered and Authorized by:   Shaune Leeks MD   Signed by:   Shaune Leeks MD on 02/22/2010   Method used:   Electronically to        [facility] [phone]* (retail)       [address] [place], [place]  [address]       Ph: [phone] or [phone]       Fax: [phone]   RxID:   [id number] SIMVASTATIN 40 MG TABS (SIMVASTATIN) one taqb by mouth at night  #30 x 12   Entered and Authorized by:   Shaune Leeks MD   Signed by:   Shaune Leeks MD on 02/22/2010   Method used:   Electronically to        [facility] [phone]* (retail)       [address] [place], [place]  [address]       Ph: [phone] or [phone]       Fax: [phone]   RxID:   [id number] FLEXERIL 10 MG TABS (CYCLOBENZAPRINE HCL) 1 by mouth up to three times a day as needed back pain  watch out for sedation  #30 x 0   Entered and Authorized by:   Shaune Leeks MD   Signed by:   Shaune Leeks MD on 02/22/2010   Method used:   Electronically to        [facility] [phone]* (retail)       [address] [place], [place]  [address]       Ph: [phone] or [phone]       Fax: [phone]   RxID:   [id number]    Orders Added: 1)  Est. Patient 65& > [78938]    Current Allergies (reviewed today): ! PENICILLIN  Appended Document: CPX/CLE    Clinical Lists Changes  Problems: Added new problem of OTHER SCREENING MAMMOGRAM (ICD-V76.12) Orders: Added new Referral order of Radiology Referral (Radiology) - Signed Observations: Added new observation of CHIEF CMPLNT: Preventive Care (02/22/2010 14:47)       PAP Screening:    Last PAP smear:  06/05/2005  Mammogram Screening:    Last Mammogram:  06/17/2006  Osteoporosis Risk Assessment:  Risk Factors for Fracture or Low Bone Density:   Race (White or Asian):     yes   Smoking status:       never  Immunization & Chemoprophylaxis:    Tetanus vaccine: Td  (12/08/2003)    Pneumovax: Pneumovax  (12/08/2003) Needs mammo...referred. Shaune Leeks MD  February 22, 2010 2:49 PM   Appended Document: CPX/CLE

## 2010-05-10 NOTE — Assessment & Plan Note (Signed)
Summary: [name] IN LEGS/CLE   Vital Signs:  Patient profile:   77 year old female Weight:      153 pounds Temp:     98.1 degrees F oral Pulse rate:   72 / minute Pulse rhythm:   regular BP sitting:   138 / 78  (left arm) Cuff size:   regular  Vitals Entered By: Sydell Axon LPN (July 13, 1306 11:37 AM) CC: Clearing throat all the time, trouble swallowing, pain in legs and they ache   History of Present Illness: Pt here for multiple medical problems. "I don't like coming in and don't like complaining." Pt's legs ache and burn when awakening. But she also relates they feel like that a lot and often prolong trying to get to sleep. She has trouble paying for her medications and doesn't think she could afford Requip. Also discussed Neurontin but that too is expensive. Her father had the same problem when he was older and her son also has this complaint.  She has been on Lyrica for shingles but stopped it a soon as poss due to cost. ..her son  takes nothing for the leg pain.  Her facial shingles is improving... she is almost over it after 2 years. Her biggest problem is swallowing now and then, latest episode is chicken pie sticking. She prefers waiting and being careful rather than GI referral and dilation. She has had that twice already.  She has had hoarseness for six months. Has no congestive sxs with it and no real throat pain. Does have the trouble swallowing as above but that has always responded to dilation so doesn't sound like that is in the pharyngeal area.  She is very active and spent all day yesterday working in the yard clearing a tree that had fallen with the wind. She had no trouble with her legs throughout all of that work and no unusual sxs last night....slept well.  Problems Prior to Update: 1)  Leg Pain, Bilateral, Nighttime  (ICD-729.5) 2)  Other Malaise and Fatigue  (ICD-780.79) 3)  Depressive Disorder  (ICD-311) 4)  Health Maintenance Exam   (ICD-V70.0) 5)  Special Screening Malig Neoplasms Other Sites  (ICD-V76.49) 6)  Postherpetic Neuralgia  (ICD-053.19) 7)  Herpes Zoster  (ICD-053.9) 8)  Periodic Limb Movement Disorder  (ICD-333.99) 9)  Gastric Ulcer  (ICD-531.90) 10)  Osteoporosis  (ICD-733.00) 11)  Osteoarthritis  (ICD-715.90) 12)  Hypertension  (ICD-401.9) 13)  Hyperlipidemia  (ICD-272.4) 14)  Gerd  (ICD-530.81)  Medications Prior to Update: 1)  Carbidopa-Levodopa 25-100 Mg Tabs (Carbidopa-Levodopa) .Marland Kitchen.. 1 Daily By Mouth 2)  Hydrochlorothiazide 12.5 Mg Tabs (Hydrochlorothiazide) .... Take 1 Tablet By Mouth Once A Day 3)  Amlodipine Besylate 10 Mg Tabs (Amlodipine Besylate) .Marland Kitchen.. 1 Daily By Mouth 4)  Pantoprazole Sodium 40 Mg Tbec (Pantoprazole Sodium) .... Take 1 Tablet By Mouth Once A Day 5)  Calcium 500/d 500-200 Mg-Unit  Tabs (Calcium Carbonate-Vitamin D) .Marland KitchenMarland KitchenMarland Kitchen 1twice A Day By Mouth 6)  Fish Oil 1200 Mg Caps (Omega-3 Fatty Acids) .Marland Kitchen.. 1 Tablet Twice A Day 7)  Acyclovir 400 Mg Tabs (Acyclovir) .Marland Kitchen.. 1 Tablet Twice A Day By Mouth 8)  Glucosamine Chondr 1500 Complx  Caps (Glucosamine-Chondroit-Vit C-Mn) .Marland Kitchen.. 1 Daily By Mouth 9)  Omnipred 1 % Susp (Prednisolone Acetate) .... As Needed As Directed 10)  Vitamin D 1000 Unit Caps (Cholecalciferol) .Marland Kitchen.. 1 Tablet Twice A Day 11)  Effexor 37.5 Mg Tabs (Venlafaxine Hcl) .... One Tab By Mouth Once Daily For One  Week Then Twice A Day.  Allergies: 1)  ! Penicillin  Physical Exam  General:  Well-developed,well-nourished,in no acute distress; alert,appropriate and cooperative throughout examination, looks healthy and vibrant. Head:  Normocephalic and atraumatic without obvious abnormalities. No apparent alopecia or balding. Sinuses nontender. Eyes:  Conjunctiva clear bilaterally.  Ears:  External ear exam shows no significant lesions or deformities.  Otoscopic examination reveals clear canals, tympanic membranes are intact bilaterally without bulging, retraction, inflammation or  discharge. Hearing is grossly normal bilaterally. Nose:  External nasal examination shows no deformity or inflammation. Nasal mucosa are pink and moist without lesions or exudates. Mouth:  Oral mucosa and oropharynx without lesions or exudates.  Teeth in good repair. No pathology seen in the throat. Neck:  No deformities, masses, or tenderness noted. Lungs:  Normal respiratory effort, chest expands symmetrically. Lungs are clear to auscultation, no crackles or wheezes. Heart:  Normal rate and regular rhythm. S1 and S2 normal without gallop, murmur, click, rub or other extra sounds. Abdomen:  Bowel sounds positive,abdomen soft and non-tender without masses, organomegaly or hernias noted. Extremities:  No clubbing, cyanosis, edema, or deformity noted with normal full range of motion of all joints.   Neurologic:  No cranial nerve deficits noted. Station and gait are normal. Sensory, motor and coordinative functions appear intact. Psych:  Cognition and judgment appear intact. Alert and cooperative with normal attention span and concentration. No apparent delusions, illusions, hallucinations, usual demeanor.   Impression & Recommendations:  Problem # 1:  HOARSENESS SINCE 10/10 (ZOX-096.04) Assessment Unchanged Refer to ENT tio insure pharyngeal health. Orders: ENT Referral (ENT)  Problem # 2:  LEG PAIN, BILATERAL, NIGHTTIME (ICD-729.5) Assessment: Unchanged Exercise seems to help. Wants to avoid medications. Use heat and ice and try to exercise regularly.  Problem # 3:  OTHER MALAISE AND FATIGUE (ICD-780.79) Assessment: Improved Seems brighter and more energetic today.  Problem # 4:  DEPRESSIVE DISORDER (ICD-311) Assessment: Unchanged Has tendency to look on negative side of things but is dealing with widowhood ok. Is clearly lonely. Seems stable on  medication w/o S/H ideation. Her updated medication list for this problem includes:    Effexor 37.5 Mg Tabs (Venlafaxine hcl) ..... One tab  by mouth once daily for one week then twice a day.  Problem # 5:  POSTHERPETIC NEURALGIA (ICD-053.19) Assessment: Improved Finally seems resolved. Cont as is.  Problem # 6:  HYPERTENSION (ICD-401.9) Assessment: Unchanged Stable. Her updated medication list for this problem includes:    Hydrochlorothiazide 12.5 Mg Tabs (Hydrochlorothiazide) .Marland Kitchen... Take 1 tablet by mouth once a day    Amlodipine Besylate 10 Mg Tabs (Amlodipine besylate) .Marland Kitchen... 1 daily by mouth  BP today: 138/78 Prior BP: 138/88 (01/03/2009)  Labs Reviewed: K+: 3.9 (12/14/2008) Creat: : 0.8 (12/14/2008)   Chol: 223 (12/14/2008)   HDL: 43.20 (12/14/2008)   LDL: 114 (09/23/2007)   TG: 242.0 (12/14/2008)  Complete Medication List: 1)  Carbidopa-levodopa 25-100 Mg Tabs (Carbidopa-levodopa) .Marland Kitchen.. 1 daily by mouth 2)  Hydrochlorothiazide 12.5 Mg Tabs (Hydrochlorothiazide) .... Take 1 tablet by mouth once a day 3)  Amlodipine Besylate 10 Mg Tabs (Amlodipine besylate) .Marland Kitchen.. 1 daily by mouth 4)  Pantoprazole Sodium 40 Mg Tbec (Pantoprazole sodium) .... Take 1 tablet by mouth once a day 5)  Calcium 500/d 500-200 Mg-unit Tabs (Calcium carbonate-vitamin d) .Marland KitchenMarland KitchenMarland Kitchen 1twice a day by mouth 6)  Fish Oil 1200 Mg Caps (Omega-3 fatty acids) .Marland Kitchen.. 1 tablet twice a day 7)  Acyclovir 400 Mg Tabs (Acyclovir) .Marland Kitchen.. 1 tablet twice  a day by mouth 8)  Glucosamine Chondr 1500 Complx Caps (Glucosamine-chondroit-vit c-mn) .Marland Kitchen.. 1 daily by mouth 9)  Omnipred 1 % Susp (Prednisolone acetate) .... As needed as directed 10)  Vitamin D 1000 Unit Caps (Cholecalciferol) .Marland Kitchen.. 1 tablet twice a day 11)  Effexor 37.5 Mg Tabs (Venlafaxine hcl) .... One tab by mouth once daily for one week then twice a day.  Patient Instructions: 1)  Refer to ENT. 2)  Needs appt for Comp Exam in future. Prescriptions: HYDROCHLOROTHIAZIDE 12.5 MG TABS (HYDROCHLOROTHIAZIDE) Take 1 tablet by mouth once a day  #30 Capsule x 12   Entered and Authorized by:   Shaune Leeks MD    Signed by:   Shaune Leeks MD on 07/12/2009   Method used:   [facility]        [facility] [phone][facility] (retail)       [address] [place], [place]  [address]       Ph: [phone] or [phone]       Fax: [phone]   RxID:   [id number] CARBIDOPA-LEVODOPA 25-100 MG TABS (CARBIDOPA-LEVODOPA) 1 daily by mouth  #30 x 12   Entered and Authorized by:   Shaune Leeks MD   Signed by:   Shaune Leeks MD on 07/12/2009   Method used:   [facility]ron[facility] to        [facility] [phone]* (retail)       [address] [place], [place]  [address]       Ph: [phone] or [phone]       Fax: [phone]   RxID:   [id number]     Current Allergies (reviewed today): ! PENICILLIN

## 2010-05-10 NOTE — Progress Notes (Signed)
Summary: restless leg  Phone Note Call from Patient Call back at Home Phone [phone]   Caller: Patient Call For: Dr. Para March Summary of Call: Patient has an appt with you on august 5th to get established. She has been having issues with restless leg  for a while now and all of last week she didn't get any sleep. She was up walking the flloors during the night and rubbing her legs down. She has been taking carbidopa every day and she says that it's not helping at all. She is asking if she could [facility] [address]  Initial call taken by: Melody Comas,  October 30, 2009 10:46 AM  Follow-up for Phone Call        I looked back at the notes from Dr. Hetty Ely.  I would need to see the patient to discuss options.  I would not send in rx meds w/o talking with her first.   Follow-up by: Crawford Givens MD,  October 30, 2009 1:03 PM  Additional Follow-up for Phone Call Additional follow up Details #1::        Patient Advised. Appointment scheduled   Additional Follow-up by: Delilah Shan CMA [facility]),  October 30, 2009 1:07 PM

## 2010-05-10 NOTE — Progress Notes (Signed)
Summary: rx alprazolam//faxed past being sighned per dr. Hetty Ely  Phone Note Refill Request Call back at [phone] [facility] 12:21 PM  Refills Requested: Medication #1:  ALPRAZOLAM 0.25 MG TABS one tab by mouth two times a day as needed sparingly for anxiety.   Last Refilled: 01/23/2008 fax request in your inbox.   Method Requested: Fax to Local Pharmacy Initial call taken by: Mervin Hack CMA,  March 15, 2008 12:21 PM  Follow-up for Phone Call        Signed. Follow-up by: Shaune Leeks MD,  March 15, 2008 2:06 PM      Prescriptions: ALPRAZOLAM 0.25 MG TABS (ALPRAZOLAM) one tab by mouth two times a day as needed sparingly for anxiety  #30 x 1   Entered by:   Providence Crosby   Authorized by:   Shaune Leeks MD   Signed by:   Providence Crosby on 03/15/2008   Method used:   Handwritten   RxID:   [id number]

## 2010-05-10 NOTE — Progress Notes (Signed)
Summary: refill request for flexeril  Phone Note Refill Request Message from:  [facility]  Refills Requested: Medication #1:  FLEXERIL 10 MG TABS 1 by mouth up to three times a day as needed back pain  watch out for sedation   Last Refilled: 02/22/2010 [facility] [address]  Initial call taken by: Lowella Petties CMA, AAMA,  March 23, 2010 9:30 AM  Follow-up for Phone Call        sent.  please have patient follow up with Dr. Hetty Ely if back pain continues or gets worse.  Follow-up by: Crawford Givens MD,  March 23, 2010 1:51 PM  Additional Follow-up for Phone Call Additional follow up Details #1::        left message with daugher to have pt return my call DeShannon Katrinka Blazing CMA [facility])  March 23, 2010 2:10 PM   Spoke with patient and advised results.  Additional Follow-up by: Mervin Hack CMA [facility]),  March 26, 2010 9:15 AM    Prescriptions: FLEXERIL 10 MG TABS (CYCLOBENZAPRINE HCL) 1 by mouth up to three times a day as needed back pain  watch out for sedation  #90 x 0   Entered and Authorized by:   Crawford Givens MD   Signed by:   Crawford Givens MD on 03/23/2010   Method used:   [facility]        [facility] [phone]* (retail)       [address] [place], [place]  [address]       Ph: [phone] or [phone]       Fax: [phone]   RxID:   [id number]

## 2010-05-10 NOTE — Assessment & Plan Note (Signed)
Summary: [name]   Vital Signs:  Patient profile:   77 year old female Height:      63 inches Weight:      149.25 pounds BMI:     26.53 Temp:     97.9 degrees F oral Pulse rate:   80 / minute Pulse rhythm:   regular BP sitting:   128 / 80  (left arm) Cuff size:   regular  Vitals Entered By: Delilah Shan CMA  [facility]) (October 31, 2009 3:24 PM) CC: Restless Leg Syndrome.  Patient says she is out of Effexor but it helped, ? go back on it?   History of Present Illness: Restless leg syndrome.  Symptoms for most of her life.  Prev on quinine/tonic water.  "It feels like legs are burning on the inside and they start twitching."  Interferes with sleep.  No help with carbidopa/levodopa now.    Prev with depression- off effexor when it ran out.  Crystal got worse at that point.  "I still get depressed."  No SI/HI.  Mood has been lower episodically.  Asking about restarting meds.   Problems Prior to Update: 1)  Hoarseness Since 10/10  (ICD-784.42) 2)  Leg Pain, Bilateral, Nighttime  (ICD-729.5) 3)  Other Malaise and Fatigue  (ICD-780.79) 4)  Depressive Disorder  (ICD-311) 5)  Health Maintenance Exam  (ICD-V70.0) 6)  Special Screening Malig Neoplasms Other Sites  (ICD-V76.49) 7)  Postherpetic Neuralgia  (ICD-053.19) 8)  Herpes Zoster  (ICD-053.9) 9)  Periodic Limb Movement Disorder  (ICD-333.99) 10)  Gastric Ulcer  (ICD-531.90) 11)  Osteoporosis  (ICD-733.00) 12)  Osteoarthritis  (ICD-715.90) 13)  Hypertension  (ICD-401.9) 14)  Hyperlipidemia  (ICD-272.4) 15)  Gerd  (ICD-530.81)  Allergies: 1)  ! Penicillin  Social History: Reviewed history from 01/03/2009 and no changes required. Marital Status: Widowed 01/1999 ; 1ST MARRIAGE 25 YEARS/ 2ND MARRIAGE 13 YEARS Children: 4 SONS   1DECEASED 10/09 / 1 DAUGHTER Occupation: retired from [employer] From [place]  Review of Systems       See HPI.  Otherwise negative.    Physical Exam  General:  GEN: nad, alert and oriented CV: rrr. PULM:  ctab, no inc wob EXT: no edema SKIN: no acute rash    Impression & Recommendations:  Problem # 1:  LEG PAIN, BILATERAL, NIGHTTIME (ICD-729.5) Restart effexor and then consider adjunct tx if no relief (neurontin vs TCA).  She'll call back if not better.   Problem # 2:  DEPRESSIVE DISORDER (ICD-311) Restart effexor.  follow up as needed.  She agrees.  The following medications were removed from the medication list:    Effexor 37.5 Mg Tabs (Venlafaxine hcl) ..... One tab by mouth once daily for one week then twice a day. Her updated medication list for this problem includes:    Venlafaxine Hcl 37.5 Mg Tabs (Venlafaxine hcl) .Marland Kitchen... 1 by mouth once daily for 1 week then two times a day  Complete Medication List: 1)  Carbidopa-levodopa 25-100 Mg Tabs (Carbidopa-levodopa) .Marland Kitchen.. 1 daily by mouth 2)  Hydrochlorothiazide 12.5 Mg Tabs (Hydrochlorothiazide) .... Take 1 tablet by mouth once a day 3)  Amlodipine Besylate 10 Mg Tabs (Amlodipine besylate) .Marland Kitchen.. 1 daily by mouth 4)  Pantoprazole Sodium 40 Mg Tbec (Pantoprazole sodium) .... Take 1 tablet by mouth once a day 5)  Calcium 500/d 500-200 Mg-unit Tabs (Calcium carbonate-vitamin d) .Marland KitchenMarland KitchenMarland Kitchen 1twice a day by mouth 6)  Fish Oil 1200 Mg Caps (Omega-3 fatty acids) .Marland Kitchen.. 1 tablet twice a day 7)  Acyclovir 400 Mg Tabs (Acyclovir) .Marland Kitchen.. 1 tablet twice a day by mouth 8)  Glucosamine Chondr 1500 Complx Caps (Glucosamine-chondroit-vit c-mn) .Marland Kitchen.. 1 daily by mouth 9)  Omnipred 1 % Susp (Prednisolone acetate) .... As needed as directed 10)  Vitamin D 1000 Unit Caps (Cholecalciferol) .Marland Kitchen.. 1 tablet twice a day 11)  [facility] 10 Mg Tabs (Loratadine) .... Take 1 tablet by mouth once a day 12)  Ibuprofen 200 Mg Tabs (Ibuprofen) .... Takes 2 or 3 per day, sometimes 4-6 per day 13)  B-caro-t 15 Mg Caps (Beta carotene) .... Take 1 capsule by mouth once a day 14)  Venlafaxine Hcl 37.5 Mg Tabs (Venlafaxine hcl) .Marland Kitchen.. 1 by mouth once daily for 1 week then two times a  day  Patient Instructions: 1)  Let me know how you are doing after you start back on the effexor.  2)  Please schedule a follow-up appointment as needed .  Prescriptions: VENLAFAXINE HCL 37.5 MG TABS (VENLAFAXINE HCL) 1 by mouth once daily for 1 week then two times a day  #60 x 12   Entered and Authorized by:   Crawford Givens MD   Signed by:   Crawford Givens MD on 10/31/2009   Method used:   [facility]        [facility] [phone]* (retail)       [address] [place], [place]  [address]       Ph: [phone] or [phone]       Fax: [phone]   RxID:   [id number]   Current Allergies (reviewed today): ! PENICILLIN

## 2010-05-10 NOTE — Progress Notes (Signed)
Summary: flexeril  Phone Note Refill Request Message from:  Fax from Pharmacy on January 22, 2010 10:29 AM  Refills Requested: Medication #1:  FLEXERIL 10 MG TABS 1 by mouth up to three times a day as needed back pain  watch out for sedation.   Last Refilled: 12/18/2009 [facility] [address] [phone]  Initial call taken by: Melody Comas,  January 22, 2010 10:30 AM  Follow-up for Phone Call        it looks like this was just done on 10/16 by Dr Para March? Follow-up by: Judith Part MD,  January 22, 2010 10:56 AM  Additional Follow-up for Phone Call Additional follow up Details #1::        [facility] did receive rx on 01/21/10. requested note added for pt to call if not better for f/u appt.Lewanda Rife LPN  January 22, 2010 11:21 AM

## 2010-05-10 NOTE — Letter (Signed)
Summary: [facility]: Immunoassay Fecal Occult Blood (iFOB) Order Form  [facility]  [address] [place], [place] [address]   Phone: [phone]  Fax: [phone]      [facility]: Immunoassay Fecal Occult Blood (iFOB) Order Form   February 22, 2010 MRN: [id number]   DANIELLY ACKERLEY 31-Dec-1929   Physicican Name:______R. Schaller___________________  Diagnosis Code:________V70.0________________      Shaune Leeks MD

## 2010-05-10 NOTE — Assessment & Plan Note (Signed)
Summary: TERRIBLE BACK PAIN / LFW   Vital Signs:  Patient profile:   77 year old female Height:      63 inches Weight:      152.50 pounds BMI:     27.11 Temp:     97.9 degrees F oral Pulse rate:   76 / minute Pulse rhythm:   regular BP sitting:   130 / 76  (left arm) Cuff size:   regular  Vitals Entered By: Lewanda Rife LPN (December 18, 2009 10:31 AM) CC: fell on 12/15/09 and has lower rt back pain since.   History of Present Illness: was pulling a pc of concrete on fri afternoon  fell and rolled over on all 4s and then went back out to keep working pain came then -- R lower back , not in buttock (though she fell on buttock)  pain is not going down her leg   no numbness or weakness  pain is throbbing and spasms when she tries to turn over in bed  sitting worst walking best   taking motrin 800 mg at a time -- usually just at night   is using heat and ice  also icy hot cold pads   Allergies: 1)  ! Penicillin  Past History:  Past Medical History: Last updated: 11/24/2006 GERD:05/1999 Hyperlipidemia :12/2003 Hypertension: 1990 Osteoarthritis:  08/21/1989 Osteoporosis 11/1997  Past Surgical History: Last updated: 11/24/2006 DEGENERATIVE DISC DISEASE L/S SPINE :(12/06/1992) HYSTERECTOMY AT AGE 16:// S/P ZOX(0960) NECK DISKECTOMY / FUSION:(DR BOTERO) (1992) EGD WITH ULCER; BX NEGATIVE; +STRICTURE GASTRIC ULCER WITH HEMMORAGE:(01/01/2002) DEXA- OSTEOPENIA MILDLY WORSENED SINCE 11/1997:(08/27/2002) EGD ESOPH STRICTURE -- DILATED:(04/23/2004) ABD. ULTRASOUND , GALLSTONES:(04/23/2004)  Family History: Last updated: 08-Jan-2009 Father: DECEASED AT 2YOA MI; BLACK LUNG DISEASE Mother:  DECEASED 79 YOA EMPHYSEMA, LUNG DISEASE Siblings: 3 BROTHERS ALL HAVE HTN , ONE WITH HEART               PROBLEMS:ONE DECEASED FROM CVA 1 SISTER WHO HAD CVA AT AGE 70 AND DIED AT 75 YOA, + DM CV: + BROTHER HEART VALVE HTN: + BROTHERS DM+ SISTER GOUT/ARTHRITIS: CANCER: NEGATIVE  COLON AND BREAST? 1 BROTHER BRAIN TUMOR/ 1 BROTHER ESOPHAGUS CANCER  Social History: Last updated: 10/31/2009 Marital Status: Widowed 01/1999 ; 1ST MARRIAGE 25 YEARS/ 2ND MARRIAGE 13 YEARS Children: 4 SONS   1DECEASED 10/09 / 1 DAUGHTER Occupation: retired from [employer] From [place]  Risk Factors: Alcohol Use: <1 (Jan 08, 2009) Caffeine Use: 2 (01/08/2009) Exercise: yes (2009/01/08)  Risk Factors: Smoking Status: never (01/08/09) Passive Smoke Exposure: no (2009-01-08)  Review of Systems General:  Denies chills, fatigue, fever, loss of appetite, and malaise. Eyes:  Denies blurring and eye irritation. CV:  Denies chest pain or discomfort, lightheadness, palpitations, and shortness of breath with exertion. Resp:  Denies cough, shortness of breath, and wheezing. GI:  Denies abdominal pain, change in bowel habits, indigestion, and nausea. GU:  Denies dysuria, hematuria, and urinary frequency. MS:  Complains of low back pain; denies joint pain, joint redness, joint swelling, cramps, muscle weakness, and stiffness. Derm:  Denies itching, lesion(s), poor wound healing, and rash. Neuro:  Denies numbness, tingling, and weakness. Heme:  Denies abnormal bruising and bleeding.  Physical Exam  General:  Well-developed,well-nourished,in no acute distress; alert,appropriate and cooperative throughout examination Head:  normocephalic, atraumatic, and no abnormalities observed.   Mouth:  pharynx pink and moist.   Neck:  No deformities, masses, or tenderness noted. nl rom  Lungs:  Normal respiratory effort, chest expands symmetrically. Lungs are clear to  auscultation, no crackles or wheezes. Heart:  Normal rate and regular rhythm. S1 and S2 normal without gallop, murmur, click, rub or other extra sounds. Abdomen:  no suprapubic tenderness or fullness felt  Msk:  no LS tenderness pain to ext and R flex nl rom hips and knees SLR yeilds back pain bilat  labored gait   Extremities:  No  clubbing, cyanosis, edema, or deformity noted with normal full range of motion of all joints.   Neurologic:  strength normal in all extremities, sensation intact to light touch, and gait normal.   Skin:  Intact without suspicious lesions or rashes Cervical Nodes:  No lymphadenopathy noted Inguinal Nodes:  No significant adenopathy Psych:  normal affect, talkative and pleasant    Impression & Recommendations:  Problem # 1:  BACK PAIN (ICD-724.5) Assessment New  after a fall with no red flags for fracture of hip or pelvis suspect spasm handout given aafp on back pain inc nsaid - see below flexeril as needed  heat and stretch if worse or no imp next wk- needs x ray of LS and pelvis  Her updated medication list for this problem includes:    Ibuprofen 200 Mg Tabs (Ibuprofen) .Marland Kitchen... 3-4 by mouth with food up to three times a day as needed back pain    Flexeril 10 Mg Tabs (Cyclobenzaprine hcl) .Marland Kitchen... 1 by mouth up to three times a day as needed back pain  watch out for sedation  Orders: Prescription Created Electronically [phone])  Complete Medication List: 1)  Carbidopa-levodopa 25-100 Mg Tabs (Carbidopa-levodopa) .Marland Kitchen.. 1 daily by mouth 2)  Hydrochlorothiazide 12.5 Mg Tabs (Hydrochlorothiazide) .... Take 1 tablet by mouth once a day 3)  Amlodipine Besylate 10 Mg Tabs (Amlodipine besylate) .Marland Kitchen.. 1 daily by mouth 4)  Pantoprazole Sodium 40 Mg Tbec (Pantoprazole sodium) .... Take 1 tablet by mouth once a day 5)  Calcium 500/d 500-200 Mg-unit Tabs (Calcium carbonate-vitamin d) .Marland KitchenMarland KitchenMarland Kitchen 1twice a day by mouth 6)  Fish Oil 1200 Mg Caps (Omega-3 fatty acids) .Marland Kitchen.. 1 tablet twice a day 7)  Acyclovir 400 Mg Tabs (Acyclovir) .Marland Kitchen.. 1 tablet twice a day by mouth 8)  Glucosamine Chondr 1500 Complx Caps (Glucosamine-chondroit-vit c-mn) .Marland Kitchen.. 1 daily by mouth 9)  Omnipred 1 % Susp (Prednisolone acetate) .... As needed as directed 10)  Vitamin D 1000 Unit Caps (Cholecalciferol) .Marland Kitchen.. 1 tablet twice a day 11)  [facility]  Allergy Relief 10 Mg Tabs (Loratadine) .... Take 1 tablet by mouth once a day 12)  Ibuprofen 200 Mg Tabs (Ibuprofen) .... 3-4 by mouth with food up to three times a day as needed back pain 13)  B-caro-t 15 Mg Caps (Beta carotene) .... Take 1 capsule by mouth once a day 14)  Venlafaxine Hcl 37.5 Mg Tabs (Venlafaxine hcl) .Marland Kitchen.. 1 by mouth once daily for 1 week then two times a day 15)  Flexeril 10 Mg Tabs (Cyclobenzaprine hcl) .Marland Kitchen.. 1 by mouth up to three times a day as needed back pain  watch out for sedation  Patient Instructions: 1)  continue the motrin at 800 mg three times a day with food -- as long as it does not bother your stomach  2)  try muscle relaxer flexeril -- it may sedate -- so use caution  3)  start using heat regularly  4)  walk as much as possible  5)  in 1 week if not improved -- call so I can schedule you for x rays  Prescriptions: FLEXERIL 10 MG TABS (CYCLOBENZAPRINE  HCL) 1 by mouth up to three times a day as needed back pain  watch out for sedation  #30 x 0   Entered and Authorized by:   Judith Part MD   Signed by:   Judith Part MD on 12/18/2009   Method used:   Electronically to        [facility] [phone]* (retail)       [address] [place], [place]  [address]       Ph: [phone] or [phone]       Fax: [phone]   RxID:   [id number]   Current Allergies (reviewed today): ! PENICILLIN

## 2010-05-10 NOTE — Progress Notes (Signed)
Summary: refill request for flexeril  Phone Note Refill Request Message from:  Fax from Pharmacy  Refills Requested: Medication #1:  FLEXERIL 10 MG TABS 1 by mouth up to three times a day as needed back pain  watch out for sedation.   Last Refilled: 12/18/2009 [facility] [address], [phone]  Initial call taken by: Lowella Petties CMA,  January 19, 2010 2:52 PM  Follow-up for Phone Call        notify patient that if pain continues, she needs OV to recheck  Follow-up by: Crawford Givens MD,  January 21, 2010 6:07 PM    Prescriptions: FLEXERIL 10 MG TABS (CYCLOBENZAPRINE HCL) 1 by mouth up to three times a day as needed back pain  watch out for sedation  #30 x 0   Entered and Authorized by:   Crawford Givens MD   Signed by:   Crawford Givens MD on 01/21/2010   Method used:   [facility]        [facility] [phone]* (retail)       [address] [place], [place]  [address]       Ph: [phone] or [phone]       Fax: [phone]   RxID:   [id number]   Appended Document: refill request for flexeril Left message with pharmacist if pain continues pt needs to call our office for f/u.

## 2010-05-10 NOTE — Letter (Signed)
Summary: Results Follow up Letter  [place] [facility]  [address] [place], [place] [address]   Phone: [phone]  Fax: [phone]    03/06/2010 MRN: [id number]  [name] [address] [place], [place]  [address]  Dear [name],  The following are the results of your recent test(s):  Test         Result    Pap Smear:        Normal _____  Not Normal _____ Comments: ______________________________________________________ Cholesterol: LDL(Bad cholesterol):         Your goal is less than:         HDL (Good cholesterol):       Your goal is more than: Comments:  ______________________________________________________ Mammogram:        Normal _____  Not Normal _____ Comments:  ___________________________________________________________________ Hemoccult:        Normal __X___  Not normal _______ Comments:  Please follow up in one year.  _____________________________________________________________________ Other Tests:    We routinely do not discuss normal results over the telephone.  If you desire a copy of the results, or you have any questions about this information we can discuss them at your next office visit.   Sincerely,      Dr. Laurita Quint

## 2010-05-10 NOTE — Progress Notes (Signed)
Summary: flexeril   Phone Note Refill Request Message from:  Fax from Pharmacy on February 20, 2010 3:50 PM  Refills Requested: Medication #1:  FLEXERIL 10 MG TABS 1 by mouth up to three times a day as needed back pain  watch out for sedation.   Last Refilled: 01/21/2010 [facility] [address] [phone].   Initial call taken by: Melody Comas,  February 20, 2010 3:51 PM  Follow-up for Phone Call        Will discuss with pt when seen Thu. Follow-up by: Shaune Leeks MD,  February 20, 2010 10:35 PM     Appended Document: flexeril  Tried to call patient to let her know. LMOM for her to return my call.

## 2010-05-10 NOTE — Letter (Signed)
Summary: Nadara Eaton letter  [place] at [facility]  [address] [place], [place] [address]   Phone: [phone]  Fax: [phone]       11/14/2009 MRN: [id number]  [facility] [address] [place], [place]  [address]  Dear [name],  [facility] [place], and [facility] announce the retirement of Arta Silence, M.D., from full-time practice at the [facility] office effective October 05, 2009 and his plans of returning part-time.  It is important to Dr. Hetty Ely and to our practice that you understand that [facility] has seven physicians in our office for your health care needs.  We will continue to offer the same exceptional care that you have today.    Dr. Hetty Ely has spoken to many of you about his plans for retirement and returning part-time in the fall.   We will continue to work with you through the transition to schedule appointments for you in the office and meet the high standards that [facility] is committed to.   Again, it is with great pleasure that we share the news that Dr. Hetty Ely will return to [facility] in October of 2011 with a reduced schedule.    If you have any questions, or would like to request an appointment with one of our physicians, please call us at [phone] and press the option for Scheduling an appointment.  We take pleasure in providing you with excellent patient care and look forward to seeing you at your next office visit.  Our [facility] Physicians are:  Tillman Abide, M.D. Laurita Quint, M.D. Roxy Manns, M.D. Kerby Nora, M.D. Hannah Beat, M.D. Ruthe Mannan, M.D. We proudly welcomed Raechel Ache, M.D. and Eustaquio Boyden, M.D. to the practice in July/August 2011.  Sincerely,   [facility]

## 2010-05-28 ENCOUNTER — Other Ambulatory Visit (INDEPENDENT_AMBULATORY_CARE_PROVIDER_SITE_OTHER): Payer: Medicare Other

## 2010-05-28 ENCOUNTER — Encounter (INDEPENDENT_AMBULATORY_CARE_PROVIDER_SITE_OTHER): Payer: Self-pay | Admitting: *Deleted

## 2010-05-28 ENCOUNTER — Other Ambulatory Visit: Payer: Self-pay | Admitting: Family Medicine

## 2010-05-28 DIAGNOSIS — E785 Hyperlipidemia, unspecified: Secondary | ICD-10-CM

## 2010-05-30 ENCOUNTER — Encounter: Payer: Self-pay | Admitting: Family Medicine

## 2010-05-30 ENCOUNTER — Ambulatory Visit (INDEPENDENT_AMBULATORY_CARE_PROVIDER_SITE_OTHER): Payer: Medicare Other | Admitting: Family Medicine

## 2010-05-30 DIAGNOSIS — I10 Essential (primary) hypertension: Secondary | ICD-10-CM

## 2010-05-30 DIAGNOSIS — K259 Gastric ulcer, unspecified as acute or chronic, without hemorrhage or perforation: Secondary | ICD-10-CM

## 2010-05-30 DIAGNOSIS — E785 Hyperlipidemia, unspecified: Secondary | ICD-10-CM

## 2010-05-30 DIAGNOSIS — R609 Edema, unspecified: Secondary | ICD-10-CM

## 2010-06-05 NOTE — Assessment & Plan Note (Signed)
Summary: 3 MONTH FU/RBH   Vital Signs:  Patient profile:   77 year old female Weight:      157.25 pounds Temp:     97.4 degrees F oral Pulse rate:   76 / minute Pulse rhythm:   regular BP sitting:   128 / 78  (left arm) Cuff size:   regular  Vitals Entered By: Sydell Axon LPN (May 30, 2010 2:46 PM) CC: 3 month follow-up after labs   History of Present Illness: Pt here for recheck of her chol. Her LDL was 170+ last time and was started on Simva 40. She has tolerated this well.  She has also had problems with heartburn, even with taking Patoprazole. She is very active and able to keep her weight down. She tries to eat healthily. We discussed previously avoiding caffeine and tomato products. She also c/o swelling of her feet at night, happening regularly for the last few months.  Problems Prior to Update: 1)  Other Screening Mammogram  (ICD-V76.12) 2)  Back Pain  (ICD-724.5) 3)  Leg Pain, Bilateral, Nighttime  (ICD-729.5) 4)  Depressive Disorder  (ICD-311) 5)  Health Maintenance Exam  (ICD-V70.0) 6)  Special Screening Malig Neoplasms Other Sites  (ICD-V76.49) 7)  Postherpetic Neuralgia  (ICD-053.19) 8)  Herpes Zoster  (ICD-053.9) 9)  Periodic Limb Movement Disorder  (ICD-333.99) 10)  Gastric Ulcer  (ICD-531.90) 11)  Osteoporosis  (ICD-733.00) 12)  Osteoarthritis  (ICD-715.90) 13)  Hypertension  (ICD-401.9) 14)  Hyperlipidemia  (ICD-272.4) 15)  Gerd  (ICD-530.81)  Medications Prior to Update: 1)  Carbidopa-Levodopa 25-100 Mg Tabs (Carbidopa-Levodopa) .Marland Kitchen.. 1 Daily By Mouth 2)  Hydrochlorothiazide 12.5 Mg Tabs (Hydrochlorothiazide) .... Take 1 Tablet By Mouth Once A Day 3)  Amlodipine Besylate 10 Mg Tabs (Amlodipine Besylate) .Marland Kitchen.. 1 Daily By Mouth 4)  Pantoprazole Sodium 40 Mg Tbec (Pantoprazole Sodium) .... Take 1 Tablet By Mouth Once A Day 5)  Calcium 500/d 500-200 Mg-Unit  Tabs (Calcium Carbonate-Vitamin D) .Marland KitchenMarland KitchenMarland Kitchen 1twice A Day By Mouth 6)  Fish Oil 1200 Mg Caps (Omega-3  Fatty Acids) .Marland Kitchen.. 1 Tablet Twice A Day 7)  Acyclovir 400 Mg Tabs (Acyclovir) .Marland Kitchen.. 1 Tablet Daily By Mouth 8)  Glucosamine Chondr 1500 Complx  Caps (Glucosamine-Chondroit-Vit C-Mn) .Marland Kitchen.. 1 Daily By Mouth 9)  Omnipred 1 % Susp (Prednisolone Acetate) .... As Needed As Directed 10)  Vitamin D 1000 Unit Caps (Cholecalciferol) .Marland Kitchen.. 1 Tablet Twice A Day 11)  [facility]vs Allergy Relief 10 Mg Tabs (Loratadine) .... Take 1 Tablet By Mouth Once A Day 12)  B-Caro-T 15 Mg Caps (Beta Carotene) .... Take 1 Capsule By Mouth Once A Day 13)  Venlafaxine Hcl 37.5 Mg Tabs (Venlafaxine Hcl) .... Take By Mouth  Two Times A Day 14)  Flexeril 10 Mg Tabs (Cyclobenzaprine Hcl) .Marland Kitchen.. 1 By Mouth Up To Three Times A Day As Needed Back Pain  Watch Out For Sedation 15)  Melatonin 3 Mg Tabs (Melatonin) .... Take By Mouth At Bedtime 16)  Aleve 220 Mg Tabs (Naproxen Sodium) .... 2 Tabs After Brfst and Supper As Needed 17)  Simvastatin 40 Mg Tabs (Simvastatin) .... One Taqb By Mouth At Night  Current Medications (verified): 1)  Carbidopa-Levodopa 25-100 Mg Tabs (Carbidopa-Levodopa) .Marland Kitchen.. 1 Daily By Mouth 2)  Hydrochlorothiazide 12.5 Mg Tabs (Hydrochlorothiazide) .... Take 1 Tablet By Mouth Once A Day 3)  Amlodipine Besylate 10 Mg Tabs (Amlodipine Besylate) .Marland Kitchen.. 1 Daily By Mouth 4)  Pantoprazole Sodium 40 Mg Tbec (Pantoprazole Sodium) .... Take 1 Tablet By Mouth  Once A Day 5)  Calcium 500/d 500-200 Mg-Unit  Tabs (Calcium Carbonate-Vitamin D) .Marland KitchenMarland KitchenMarland Kitchen 1twice A Day By Mouth 6)  Fish Oil 1200 Mg Caps (Omega-3 Fatty Acids) .Marland Kitchen.. 1 Tablet Twice A Day 7)  Acyclovir 400 Mg Tabs (Acyclovir) .Marland Kitchen.. 1 Tablet Daily By Mouth 8)  Glucosamine Chondr 1500 Complx  Caps (Glucosamine-Chondroit-Vit C-Mn) .Marland Kitchen.. 1 Daily By Mouth 9)  Omnipred 1 % Susp (Prednisolone Acetate) .... As Needed As Directed 10)  Vitamin D 1000 Unit Caps (Cholecalciferol) .Marland Kitchen.. 1 Tablet Twice A Day 11)  Cvs Allergy Relief 10 Mg Tabs (Loratadine) .... Take 1 Tablet By Mouth Once A Day 12)   B-Caro-T 15 Mg Caps (Beta Carotene) .... Take 1 Capsule By Mouth Once A Day 13)  Venlafaxine Hcl 37.5 Mg Tabs (Venlafaxine Hcl) .... Take By Mouth  Two Times A Day 14)  Flexeril 10 Mg Tabs (Cyclobenzaprine Hcl) .Marland Kitchen.. 1 By Mouth Up To Three Times A Day As Needed Back Pain  Watch Out For Sedation 15)  Melatonin 3 Mg Tabs (Melatonin) .... Take By Mouth At Bedtime 16)  Aleve 220 Mg Tabs (Naproxen Sodium) .... 2 Tabs After Brfst and Supper As Needed 17)  Simvastatin 40 Mg Tabs (Simvastatin) .... One Taqb By Mouth At Night  Allergies: 1)  ! Penicillin  Physical Exam  General:  Well-developed,well-nourished,in no acute distress; alert,appropriate and cooperative throughout examination Head:  Normocephalic and atraumatic without obvious abnormalities. No apparent alopecia or balding. Eyes:  Conjunctiva clear bilaterally.  Ears:  External ear exam shows no significant lesions or deformities.  Otoscopic examination reveals clear canals, tympanic membranes are intact bilaterally without bulging, retraction, inflammation or discharge. Hearing is grossly normal bilaterally. Nose:  External nasal examination shows no deformity or inflammation. Nasal mucosa are pink and moist without lesions or exudates. Mouth:  pharynx pink and moist.   Neck:  No deformities, masses, or tenderness noted. nl rom  Lungs:  Normal respiratory effort, chest expands symmetrically. Lungs are clear to auscultation, no crackles or wheezes. Heart:  Normal rate and regular rhythm. S1 and S2 normal without gallop, murmur, click, rub or other extra sounds. Abdomen:  no suprapubic tenderness or fullness felt  Extremities:  No clubbing, cyanosis, edema, or deformity noted with normal full range of motion of all joints.  Minimal fluid in the feet bilat. Neurologic:  strength normal in all extremities, sensation intact to light touch, and gait normal.   Psych:  normal affect, talkative and pleasant    Impression &  Recommendations:  Problem # 1:  HYPERLIPIDEMIA (ICD-272.4) Assessment Improved Great change.Marland KitchenMarland KitchenLDL went down 100mg /dl.  Cont Simva and add Co Q 10 as discussed. Her updated medication list for this problem includes:    Simvastatin 40 Mg Tabs (Simvastatin) ..... One taqb by mouth at night  Problem # 2:  GASTRIC ULCER (ICD-531.90) Assessment: Unchanged  Ulcer resolved but GERD worse...google "jogging in a jug" and take some daily. Alsom again discussed avoiding caffeine and tomato prods. Her updated medication list for this problem includes:    Pantoprazole Sodium 40 Mg Tbec (Pantoprazole sodium) .Marland Kitchen... Take 1 tablet by mouth once a day  Labs Reviewed: Hgb: 14.8 (02/19/2010)   Hct: 43.1 (02/19/2010)   Hemoccult: negative (03/06/2010)     Problem # 3:  EDEMA OF FEET (ICD-782.3) Assessment: New  Decrease Norvasc to 5mg  a day. Her updated medication list for this problem includes:    Hydrochlorothiazide 12.5 Mg Tabs (Hydrochlorothiazide) .Marland Kitchen... Take 1 tablet by mouth once a day  Discussed  elevation of the legs, use of compression stockings, sodium restiction, and medication use.   Problem # 4:  HYPERTENSION (ICD-401.9) Assessment: Unchanged  Stable. Will recheck next time due to decreasing Norvasc. Her updated medication list for this problem includes:    Hydrochlorothiazide 12.5 Mg Tabs (Hydrochlorothiazide) .Marland Kitchen... Take 1 tablet by mouth once a day    Amlodipine Besylate 10 Mg Tabs (Amlodipine besylate) .Marland Kitchen... 1 daily by mouth  BP today: 128/78 Prior BP: 134/84 (02/22/2010)  Labs Reviewed: K+: 3.8 (02/19/2010) Creat: : 0.7 (02/19/2010)   Chol: 162 (05/28/2010)   HDL: 69.80 (05/28/2010)   LDL: 73 (05/28/2010)   TG: 94.0 (05/28/2010)  Complete Medication List: 1)  Carbidopa-levodopa 25-100 Mg Tabs (Carbidopa-levodopa) .Marland Kitchen.. 1 daily by mouth 2)  Hydrochlorothiazide 12.5 Mg Tabs (Hydrochlorothiazide) .... Take 1 tablet by mouth once a day 3)  Amlodipine Besylate 10 Mg Tabs (Amlodipine  besylate) .Marland Kitchen.. 1 daily by mouth 4)  Pantoprazole Sodium 40 Mg Tbec (Pantoprazole sodium) .... Take 1 tablet by mouth once a day 5)  Calcium 500/d 500-200 Mg-unit Tabs (Calcium carbonate-vitamin d) .Marland KitchenMarland KitchenMarland Kitchen 1twice a day by mouth 6)  Fish Oil 1200 Mg Caps (Omega-3 fatty acids) .Marland Kitchen.. 1 tablet twice a day 7)  Acyclovir 400 Mg Tabs (Acyclovir) .Marland Kitchen.. 1 tablet daily by mouth 8)  Glucosamine Chondr 1500 Complx Caps (Glucosamine-chondroit-vit c-mn) .Marland Kitchen.. 1 daily by mouth 9)  Omnipred 1 % Susp (Prednisolone acetate) .... As needed as directed 10)  Vitamin D 1000 Unit Caps (Cholecalciferol) .Marland Kitchen.. 1 tablet twice a day 11)  [facility]vs Allergy Relief 10 Mg Tabs (Loratadine) .... Take 1 tablet by mouth once a day 12)  B-caro-t 15 Mg Caps (Beta carotene) .... Take 1 capsule by mouth once a day 13)  Venlafaxine Hcl 37.5 Mg Tabs (Venlafaxine hcl) .... Take by mouth  two times a day 14)  Flexeril 10 Mg Tabs (Cyclobenzaprine hcl) .Marland Kitchen.. 1 by mouth up to three times a day as needed back pain  watch out for sedation 15)  Melatonin 3 Mg Tabs (Melatonin) .... Take by mouth at bedtime 16)  Aleve 220 Mg Tabs (Naproxen sodium) .... 2 tabs after brfst and supper as needed 17)  Simvastatin 40 Mg Tabs (Simvastatin) .... One taqb by mouth at night  Patient Instructions: 1)  RTC 2 mos for BP recheck.   Orders Added: 1)  Est. Patient Level IV [62831]    Current Allergies (reviewed today): ! PENICILLIN

## 2010-06-13 ENCOUNTER — Ambulatory Visit: Payer: Self-pay | Admitting: Ophthalmology

## 2010-06-30 ENCOUNTER — Other Ambulatory Visit: Payer: Self-pay | Admitting: Family Medicine

## 2010-07-12 ENCOUNTER — Encounter: Payer: Self-pay | Admitting: Family Medicine

## 2010-07-19 ENCOUNTER — Ambulatory Visit (INDEPENDENT_AMBULATORY_CARE_PROVIDER_SITE_OTHER): Payer: Medicare Other | Admitting: Family Medicine

## 2010-07-19 ENCOUNTER — Encounter: Payer: Self-pay | Admitting: Family Medicine

## 2010-07-19 DIAGNOSIS — E785 Hyperlipidemia, unspecified: Secondary | ICD-10-CM

## 2010-07-19 DIAGNOSIS — K219 Gastro-esophageal reflux disease without esophagitis: Secondary | ICD-10-CM

## 2010-07-19 DIAGNOSIS — I10 Essential (primary) hypertension: Secondary | ICD-10-CM

## 2010-07-19 NOTE — Assessment & Plan Note (Signed)
Try jogging in a jug in addition to the Pantoprazole for heartburn at night. Again discussed avoiding caffeine and tomato.

## 2010-07-19 NOTE — Assessment & Plan Note (Signed)
Now on Co Q 10 with Simva 40 and tolerating well. Cont.

## 2010-07-19 NOTE — Assessment & Plan Note (Signed)
Good control on lower dose of Amlodipine with resolution of her edema. Cont curr meds.

## 2010-07-19 NOTE — Patient Instructions (Signed)
RTC 12/12 for Comp Exam, labs prior. Google Jogging in a Jug for GERD.

## 2010-07-19 NOTE — Progress Notes (Signed)
  Subjective:    Patient ID: [name], female    DOB: 09-26-1929, 77 y.o.   MRN: [id number]  HPI Pt here for followup of BP and edema. She had also had more GERD last time and was having muscle discomfort we assumed was from the statin....told to take Co Q 10 to alleviate that. She has done everything except the jogging in a jug and feels really good today. She has no complaints. Swelling has been gone since changing the Norvasc.   Review of Systems Noncontrib     Objective:   Physical Exam  Constitutional: She appears well-developed and well-nourished. No distress.  HENT:  Head: Normocephalic and atraumatic.  Right Ear: External ear normal.  Left Ear: External ear normal.  Nose: Nose normal.  Mouth/Throat: Oropharynx is clear and moist. No oropharyngeal exudate.  Eyes: Conjunctivae and EOM are normal. Pupils are equal, round, and reactive to light.  Neck: Normal range of motion. Neck supple. No thyromegaly present.  Cardiovascular: Normal rate, regular rhythm and normal heart sounds.   Pulmonary/Chest: Effort normal and breath sounds normal. She has no wheezes. She has no rales.  Lymphadenopathy:    She has no cervical adenopathy.  Skin: She is not diaphoretic.          Assessment & Plan:

## 2010-08-06 ENCOUNTER — Ambulatory Visit (INDEPENDENT_AMBULATORY_CARE_PROVIDER_SITE_OTHER): Payer: Medicare Other | Admitting: Family Medicine

## 2010-08-06 ENCOUNTER — Encounter: Payer: Self-pay | Admitting: Family Medicine

## 2010-08-06 VITALS — BP 130/78 | HR 80 | Temp 97.9°F | Wt 153.0 lb

## 2010-08-06 DIAGNOSIS — L089 Local infection of the skin and subcutaneous tissue, unspecified: Secondary | ICD-10-CM

## 2010-08-06 MED ORDER — DOXYCYCLINE HYCLATE 100 MG PO CAPS: 100.0000 mg | ORAL_CAPSULE | Freq: Two times a day (BID) | ORAL | Status: AC

## 2010-08-06 NOTE — Patient Instructions (Signed)
Pustule drained today, sent for culture. Looks like MRSA, will treat with doxycycline 100mg  twice daily for 10 days. Keep leg elevated, dressing changes daily with antibiotic ointment. Update [place] if fevers, spreading redness, or draining pus or worsening in stead of improving. Good to see you today, call us with questions.

## 2010-08-06 NOTE — Progress Notes (Addendum)
  Subjective:    Patient ID: [name], female    DOB: 04-Jan-1930, 77 y.o.   MRN: [id number]  HPI CC: spider bite?  [place], noticed lesion L leg Friday (3d ago).  Has progressively gotten bigger.  Denies remembering bug bite.  Cleaning with peroxide and used bandaid on top.  No fevers/chills, nausea/vomiting, abd pain.  Review of Systems Per HPI    Objective:   Physical Exam  Vitals reviewed. Constitutional: She appears well-developed and well-nourished. No distress.  Skin: Skin is warm.       Left lateral upper leg with large pustule 1 cm diameter.  Mild erythema surrounding lesion.        IC obtained and in in chart.  Pustule drained: area cleaned with alcohol then 18g needle used to create incision, drained pus, collected and sent for culture.  Lesion dressed with abx ointment and covered.  Pt tolerated procedure without complications.  Assessment & Plan:

## 2010-08-06 NOTE — Assessment & Plan Note (Signed)
Large pustule left lateral leg drained, wound cx sent. Anticipate MRSA, cover with doxy.   Recommended elevation of leg and dressing changes with abx ointment. Return if red flags discussed.

## 2010-08-09 LAB — WOUND CULTURE: Gram Stain: NONE SEEN

## 2010-08-21 NOTE — Assessment & Plan Note (Signed)
[facility]                                 ON-CALL NOTE   NAME:Yearick, Shikita                        MRN:          [id number]  DATE:04/12/2007                            DOB:          1929-10-15    PHONE NUMBER:  [phone]   PRIMARY CARE PHYSICIAN:  Arta Silence, MD.   SUBJECTIVE:  Ms. Channell states that she has a headache that is severe,  that started day before yesterday.  It has not improved at all and  continues to get worse.  It has not improved with Aleve or Tylenol.  It  is over her temple and in one side of her head and over her eye.  She is  someone who never has headaches and does not have too many health  problems but she is 77 years old.  She does not have a blood pressure  cuff to check her blood pressure.  She denies any other symptoms like  nausea, vomiting, weakness, numbness, slurred speech or dizziness.   ASSESSMENT AND PLAN:  Given severe new headache in a 77 year old female  with no history of headaches, I have suggested for her to be evaluated  at an urgent care for them to check a neurologic exam as well as check  her blood pressure.  If need be, they can send her to the ER to get a  head CT.     Kerby Nora, MD  Electronically Signed    AB/MedQ  DD: 04/12/2007  DT: 04/12/2007  Job #: [id number]

## 2010-08-28 ENCOUNTER — Other Ambulatory Visit: Payer: Self-pay | Admitting: Family Medicine

## 2010-09-28 ENCOUNTER — Other Ambulatory Visit: Payer: Self-pay | Admitting: Family Medicine

## 2010-10-24 ENCOUNTER — Ambulatory Visit: Payer: Self-pay | Admitting: Ophthalmology

## 2010-11-05 ENCOUNTER — Other Ambulatory Visit: Payer: Self-pay | Admitting: Family Medicine

## 2010-11-05 NOTE — Telephone Encounter (Signed)
Sent!

## 2011-01-08 ENCOUNTER — Other Ambulatory Visit: Payer: Self-pay | Admitting: Family Medicine

## 2011-01-09 NOTE — Telephone Encounter (Signed)
Already refilled by Dr. Hetty Ely on 01/08/11.

## 2011-01-09 NOTE — Telephone Encounter (Signed)
Error/ Duplicate

## 2011-01-09 NOTE — Telephone Encounter (Signed)
Please let me know about this.  Was this a duplicate?

## 2011-02-10 ENCOUNTER — Other Ambulatory Visit: Payer: Self-pay | Admitting: Family Medicine

## 2011-02-11 NOTE — Telephone Encounter (Signed)
Received refill request electronically from pharmacy. Is it okay to refill medication? 

## 2011-03-06 ENCOUNTER — Other Ambulatory Visit: Payer: Self-pay | Admitting: Family Medicine

## 2011-03-06 DIAGNOSIS — G2589 Other specified extrapyramidal and movement disorders: Secondary | ICD-10-CM

## 2011-03-06 DIAGNOSIS — I10 Essential (primary) hypertension: Secondary | ICD-10-CM

## 2011-03-06 DIAGNOSIS — E785 Hyperlipidemia, unspecified: Secondary | ICD-10-CM

## 2011-03-06 DIAGNOSIS — B0229 Other postherpetic nervous system involvement: Secondary | ICD-10-CM

## 2011-03-13 ENCOUNTER — Other Ambulatory Visit (INDEPENDENT_AMBULATORY_CARE_PROVIDER_SITE_OTHER): Payer: Medicare Other

## 2011-03-13 DIAGNOSIS — G2589 Other specified extrapyramidal and movement disorders: Secondary | ICD-10-CM

## 2011-03-13 DIAGNOSIS — E785 Hyperlipidemia, unspecified: Secondary | ICD-10-CM

## 2011-03-13 DIAGNOSIS — I10 Essential (primary) hypertension: Secondary | ICD-10-CM

## 2011-03-13 DIAGNOSIS — B0229 Other postherpetic nervous system involvement: Secondary | ICD-10-CM

## 2011-03-13 LAB — BASIC METABOLIC PANEL
BUN: 19 mg/dL (ref 6–23)
Chloride: 106 mEq/L (ref 96–112)
Creatinine, Ser: 0.9 mg/dL (ref 0.4–1.2)
GFR: 64.07 mL/min (ref >60.00)
Glucose, Bld: 102 mg/dL — ABNORMAL HIGH (ref 70–99)

## 2011-03-13 LAB — CBC WITH DIFFERENTIAL/PLATELET
Basophils Relative: 1.4 % (ref 0.0–3.0)
Eosinophils Relative: 6.3 % — ABNORMAL HIGH (ref 0.0–5.0)
HCT: 38.1 % (ref 36.0–46.0)
Hemoglobin: 12.9 g/dL (ref 12.0–15.0)
Lymphocytes Relative: 31.3 % (ref 12.0–46.0)
Lymphs Abs: 1.4 10*3/uL (ref 0.7–4.0)
Monocytes Relative: 10.4 % (ref 3.0–12.0)
Neutro Abs: 2.3 10*3/uL (ref 1.4–7.7)
RBC: 4.13 Mil/uL (ref 3.87–5.11)
RDW: 13.5 % (ref 11.5–14.6)
WBC: 4.6 10*3/uL (ref 4.5–10.5)

## 2011-03-13 LAB — HEPATIC FUNCTION PANEL
ALT: 26 U/L (ref 0–35)
AST: 30 U/L (ref 0–37)
Albumin: 4 g/dL (ref 3.5–5.2)
Alkaline Phosphatase: 54 U/L (ref 39–117)
Bilirubin, Direct: 0.1 mg/dL (ref 0.0–0.3)
Total Protein: 6.8 g/dL (ref 6.0–8.3)

## 2011-03-13 LAB — LIPID PANEL
HDL: 67.1 mg/dL (ref >39.00)
Total CHOL/HDL Ratio: 2

## 2011-03-20 ENCOUNTER — Ambulatory Visit (INDEPENDENT_AMBULATORY_CARE_PROVIDER_SITE_OTHER): Payer: Medicare Other | Admitting: Family Medicine

## 2011-03-20 ENCOUNTER — Encounter: Payer: Self-pay | Admitting: Family Medicine

## 2011-03-20 VITALS — BP 150/80 | HR 64 | Temp 98.1°F | Ht 63.0 in | Wt 153.2 lb

## 2011-03-20 DIAGNOSIS — K219 Gastro-esophageal reflux disease without esophagitis: Secondary | ICD-10-CM

## 2011-03-20 DIAGNOSIS — E785 Hyperlipidemia, unspecified: Secondary | ICD-10-CM

## 2011-03-20 DIAGNOSIS — B0229 Other postherpetic nervous system involvement: Secondary | ICD-10-CM

## 2011-03-20 DIAGNOSIS — M810 Age-related osteoporosis without current pathological fracture: Secondary | ICD-10-CM

## 2011-03-20 DIAGNOSIS — F329 Major depressive disorder, single episode, unspecified: Secondary | ICD-10-CM

## 2011-03-20 DIAGNOSIS — I10 Essential (primary) hypertension: Secondary | ICD-10-CM

## 2011-03-20 DIAGNOSIS — Z23 Encounter for immunization: Secondary | ICD-10-CM

## 2011-03-20 NOTE — Assessment & Plan Note (Signed)
Well controlled. Happens only at [facility].

## 2011-03-20 NOTE — Patient Instructions (Signed)
RTC 1 year with Dr Reece Agar for get acquainted visit and PE with labs prior. Consider Zostavax.

## 2011-03-20 NOTE — Progress Notes (Signed)
Subjective:    Patient ID: [name], female    DOB: May 03, 1929, 77 y.o.   MRN: [id number]  HPI  Pt here for Comp Exam. She has no complaints and feels well. She is still tolerating her simva with Co Q 10 well.  She was seen for a pustule on her leg by Dr Reece Agar and that is resolved. Her ongoing problem is her eyes. She had cataract surgery, one in May OS and OD in Sep.  She denies depression except when at the [facility]. She has had a hysterectomy. We have stopped Pap smears.  Her last mammo was 12/11 and will repeat next year at two years.  Her BPO is up slightly but has not had her medication yet today as she has them laid out but left the house prior. She got her granson to move out and in with his sister as he was smoking a lot, which bothered her.   Review of Systems  Constitutional: Negative for fever, chills, diaphoresis, fatigue and unexpected weight change.  HENT: Negative for ear pain, rhinorrhea, trouble swallowing and tinnitus. Hearing loss: mild by eval here with machine but seems to hear normally clinically.    Eyes: Positive for discharge (since cataract in left eye.). Negative for pain and visual disturbance.  Respiratory: Positive for cough (mild chronic. ). Negative for shortness of breath and wheezing.   Cardiovascular: Negative for chest pain, palpitations and leg swelling.       No Fainting or Fatigue.  Gastrointestinal: Negative for nausea, vomiting, abdominal pain, diarrhea, constipation and blood in stool.       Occas Heartburn, watches her diet.  Genitourinary: Negative for dysuria and frequency.  Musculoskeletal: Negative for myalgias, back pain and arthralgias.  Skin: Negative for rash.       No Itching or Dryness. Continued itching on the left forehead and anterior scalp residual from her shingles episode.  Neurological: Negative for tremors and numbness.       No Tingling. No Balance Problems.  Hematological: Negative for adenopathy. Bruises/bleeds  easily.  Psychiatric/Behavioral: Negative for dysphoric mood and agitation.       Objective:   Physical Exam  Constitutional: She is oriented to person, place, and time. She appears well-developed and well-nourished. No distress.  HENT:  Head: Normocephalic and atraumatic.  Left Ear: External ear normal.  Nose: Nose normal.  Mouth/Throat: Oropharynx is clear and moist. No oropharyngeal exudate.  Eyes: Conjunctivae and EOM are normal. Pupils are equal, round, and reactive to light. No scleral icterus.  Neck: Normal range of motion. Neck supple. No thyromegaly present.  Cardiovascular: Normal rate, regular rhythm and normal heart sounds.  Exam reveals no friction rub.   No murmur heard. Pulmonary/Chest: Effort normal and breath sounds normal. No respiratory distress. She has no wheezes. She has no rales.       Breast exam nml with nodularity, skin changes or axillary adenopathy.  Abdominal: Soft. Bowel sounds are normal. She exhibits no mass. There is no tenderness.  Genitourinary: Guaiac negative stool.       Bimanual only done, uterus and cervix absent, ovaries not felt. Rectal nml.  Musculoskeletal: Normal range of motion. She exhibits no edema and no tenderness.  Lymphadenopathy:    She has no cervical adenopathy.  Neurological: She is alert and oriented to person, place, and time. She has normal reflexes.  Skin: Skin is warm and dry. No rash noted. She is not diaphoretic. No erythema.  Psychiatric: She has a normal mood  and affect. Her behavior is normal. Judgment and thought content normal.          Assessment & Plan:  HMPE  I have personally reviewed the Medicare Annual Wellness questionnaire and have noted 1. The patient's medical and social history 2. Their use of alcohol, tobacco or illicit drugs 3. Their current medications and supplements 4. The patient's functional ability including ADL's, fall risks, home safety risks and hearing or visual              impairment. 5. Diet and physical activities 6. Evidence for depression or mood disorders No evidence of cognitive decline.

## 2011-03-20 NOTE — Assessment & Plan Note (Signed)
Great nos on current meds. Cont as she is tolerating well. Discussed inflammation and avoiding sweets and carbs. Lab Results  Component Value Date   CHOL 109 03/13/2011   HDL 67.10 03/13/2011   LDLCALC 35 03/13/2011   LDLDIRECT 173.1 02/19/2010   TRIG 37.0 03/13/2011   CHOLHDL 2 03/13/2011  `

## 2011-03-20 NOTE — Assessment & Plan Note (Signed)
Doing ok although has occas symptoms, typically with food.

## 2011-03-20 NOTE — Assessment & Plan Note (Signed)
Didn't take her meds this AM before coming. Has been ok in the past. BP Readings from Last 3 Encounters:  03/20/11 150/80  08/06/10 130/78  07/19/10 122/70

## 2011-03-20 NOTE — Assessment & Plan Note (Signed)
On calcium/Vit D.

## 2011-03-20 NOTE — Assessment & Plan Note (Signed)
Still causes itching or her forehead site. Discussed Zostavax.

## 2011-04-03 ENCOUNTER — Other Ambulatory Visit: Payer: Self-pay | Admitting: Family Medicine

## 2011-04-03 ENCOUNTER — Other Ambulatory Visit: Payer: Medicare Other

## 2011-04-03 DIAGNOSIS — Z1211 Encounter for screening for malignant neoplasm of colon: Secondary | ICD-10-CM

## 2011-04-03 DIAGNOSIS — Z0000 Encounter for general adult medical examination without abnormal findings: Secondary | ICD-10-CM

## 2011-04-03 LAB — FECAL OCCULT BLOOD, IMMUNOCHEMICAL: Fecal Occult Bld: NEGATIVE

## 2011-04-04 ENCOUNTER — Encounter: Payer: Self-pay | Admitting: *Deleted

## 2011-04-09 ENCOUNTER — Other Ambulatory Visit: Payer: Self-pay | Admitting: Family Medicine

## 2011-04-10 NOTE — Telephone Encounter (Signed)
Will route to new PMD.

## 2011-05-10 DIAGNOSIS — C4491 Basal cell carcinoma of skin, unspecified: Secondary | ICD-10-CM

## 2011-05-10 HISTORY — DX: Basal cell carcinoma of skin, unspecified: C44.91

## 2011-05-16 ENCOUNTER — Other Ambulatory Visit: Payer: Self-pay | Admitting: *Deleted

## 2011-05-16 MED ORDER — CYCLOBENZAPRINE HCL 10 MG PO TABS: 10.0000 mg | ORAL_TABLET | Freq: Two times a day (BID) | ORAL | Status: DC | PRN

## 2011-05-16 MED ORDER — AMLODIPINE BESYLATE 10 MG PO TABS: 10.0000 mg | ORAL_TABLET | Freq: Every day | ORAL | Status: DC

## 2011-05-16 NOTE — Telephone Encounter (Signed)
OK to refill

## 2011-05-20 HISTORY — PX: SKIN CANCER EXCISION: SHX779

## 2011-06-17 ENCOUNTER — Other Ambulatory Visit: Payer: Self-pay | Admitting: Family Medicine

## 2011-07-18 ENCOUNTER — Other Ambulatory Visit: Payer: Self-pay | Admitting: Family Medicine

## 2011-07-29 ENCOUNTER — Encounter: Payer: Self-pay | Admitting: Family Medicine

## 2011-07-29 ENCOUNTER — Ambulatory Visit (INDEPENDENT_AMBULATORY_CARE_PROVIDER_SITE_OTHER): Payer: Medicare Other | Admitting: Family Medicine

## 2011-07-29 VITALS — BP 140/84 | HR 88 | Temp 97.8°F | Wt 153.2 lb

## 2011-07-29 DIAGNOSIS — K219 Gastro-esophageal reflux disease without esophagitis: Secondary | ICD-10-CM

## 2011-07-29 MED ORDER — DEXLANSOPRAZOLE 60 MG PO CPDR: 60.0000 mg | DELAYED_RELEASE_CAPSULE | Freq: Every day | ORAL | Status: DC

## 2011-07-29 NOTE — Progress Notes (Signed)
  Subjective:    Patient ID: [name], female    DOB: 03-Jan-1930, 78 y.o.   MRN: [id number]  HPI CC: transfer from Dr. Hetty Ely  1 fall in past year, no injury.  GERD - having increasing sxs recently.  Takes tums 4x/wk.  On protonix 40mg  daily for last ~10 yrs.  Never on other PPI.  Always with occasional breakthrough symptoms, recently getting more bothersome.  Heartburn in center of chest described as burning.  Notices more sxs with typical foods red sauce.  Has tried zantac as well.  Swallowing - episode where tylenol got stuck in throat 2006 (H/o esoph strictures in past).  not currently an issue.  No fevers/chills, abd pain, nausea/vomiting, appetite or weight changes.  No early satiety.  Wt Readings from Last 3 Encounters:  07/29/11 153 lb 4 oz (69.514 kg)  03/20/11 153 lb 4 oz (69.514 kg)  08/06/10 153 lb 0.6 oz (69.418 kg)    New puppy at home.  Large family - 14 grandchildren.  Widow, lives at home alone.  Lost son in 2009.  Currently dating older gentleman.  Past Medical History  Diagnosis Date  . GERD (gastroesophageal reflux disease) 05/1999  . Hyperlipidemia 12/2003  . Hypertension 1990  . Osteoarthritis 08/21/1989  . Osteoporosis 11/1997  . DDD (degenerative disc disease), lumbosacral 12/06/1992   Past Surgical History  Procedure Date  . Abdominal hysterectomy Age 67 - 29    S/P TAH  . Cervical discectomy 1992    Fusion (Dr. Jeral Fruit)  . Esophagogastroduodenoscopy 01/01/2002    with ulcer, bx. neg; + stricture gastric ulcer with hemorrhage  . Esophagogastroduodenoscopy 04/23/2004    Esophageal stricture -- dilated  . Abdominal ultrasound 04/23/2004    Gallstones  . Cataract extraction 05/2010    left eye  . Skin cancer excision 05/20/2011    BCC from nose    Review of Systems Per HPI    Objective:   Physical Exam  Nursing note and vitals reviewed. Constitutional: She appears well-developed and well-nourished. No distress.  HENT:  Head:  Normocephalic and atraumatic.  Mouth/Throat: Oropharynx is clear and moist. No oropharyngeal exudate.  Neck: Normal range of motion. Neck supple.  Cardiovascular: Normal rate, regular rhythm, normal heart sounds and intact distal pulses.   No murmur heard. Pulmonary/Chest: Effort normal and breath sounds normal. No respiratory distress. She has no wheezes. She has no rales.  Abdominal: Soft. Bowel sounds are normal. She exhibits no distension and no mass. There is no hepatosplenomegaly. There is no tenderness. There is no rigidity, no rebound, no guarding and negative Murphy's sign.  Lymphadenopathy:    She has no cervical adenopathy.  Skin: Skin is warm and dry. No rash noted.  Psychiatric: She has a normal mood and affect.       Assessment & Plan:

## 2011-07-29 NOTE — Patient Instructions (Signed)
Change from protonix to nexium and take daily for reflux control If not better, please let me know for further evaluation. Head of bed elevated. Avoidance of citrus, fatty foods, chocolate, peppermint, and excessive alcohol, along with sodas, orange juice (acidic drinks) At least a few hours between dinner and bed, minimize naps after eating. No smoking. Good to see you today.

## 2011-07-29 NOTE — Assessment & Plan Note (Signed)
Uncomplicated. No red flags. Change from protonix to nexium one a day. Update [place] if sxs not improving. GERD precautions discussed. H/o gastric ulcer in past, currently no nausea, vomiting.

## 2011-07-30 ENCOUNTER — Telehealth: Payer: Self-pay | Admitting: *Deleted

## 2011-07-30 NOTE — Telephone Encounter (Signed)
Received request from pharmacy for prior authorization on Dexilant.  Paperwork from [place] is your in box to be completed and faxed back.

## 2011-08-01 NOTE — Telephone Encounter (Signed)
Filled and placed in kim's box. 

## 2011-08-05 NOTE — Telephone Encounter (Signed)
PA forms faxed and given to Rena.  

## 2011-08-06 NOTE — Telephone Encounter (Signed)
Stacy with [employer] approved Dexilant 60 mg from 08/05/11 to 08/04/12. Will fax approval letter to our office. I notified Megan at [facility] by phone and rx processed. Will wait for approval letter to have Dr Reece Agar sign and send for scanning.

## 2011-08-07 ENCOUNTER — Telehealth: Payer: Self-pay

## 2011-08-07 MED ORDER — ESOMEPRAZOLE MAGNESIUM 40 MG PO CPDR: 40.0000 mg | DELAYED_RELEASE_CAPSULE | Freq: Every day | ORAL | Status: DC

## 2011-08-07 NOTE — Telephone Encounter (Signed)
Have sent in nexium - but insurance may want to try dexilant first - will await to see which is approved.

## 2011-08-07 NOTE — Telephone Encounter (Signed)
Pt request Nexium 40 mg sent to [facility] [address]; Nexium samples Dr Sharen Hones gave pt  Worked. Pt last seen 07/29/11. Since Dexilant was just approved by Prior auth I asked pt if she was taking Dexilant and pt said Dexilant did not help. Pt can be reached at [phone].

## 2011-08-20 NOTE — Telephone Encounter (Signed)
Have not received approval letter; did receive verbal approval from [place] at [facility]. Sent PA form for scanning.

## 2011-09-17 ENCOUNTER — Other Ambulatory Visit: Payer: Self-pay | Admitting: Family Medicine

## 2011-12-01 ENCOUNTER — Other Ambulatory Visit: Payer: Self-pay | Admitting: Family Medicine

## 2011-12-02 ENCOUNTER — Other Ambulatory Visit: Payer: Self-pay | Admitting: *Deleted

## 2011-12-02 MED ORDER — VENLAFAXINE HCL 37.5 MG PO TABS: 37.5000 mg | ORAL_TABLET | Freq: Two times a day (BID) | ORAL | Status: DC

## 2011-12-02 NOTE — Telephone Encounter (Signed)
Faxed refill request   

## 2011-12-31 ENCOUNTER — Other Ambulatory Visit: Payer: Self-pay | Admitting: Family Medicine

## 2012-02-07 ENCOUNTER — Ambulatory Visit (INDEPENDENT_AMBULATORY_CARE_PROVIDER_SITE_OTHER): Payer: Medicare Other | Admitting: Family Medicine

## 2012-02-07 ENCOUNTER — Encounter: Payer: Self-pay | Admitting: Family Medicine

## 2012-02-07 VITALS — BP 136/82 | HR 88 | Temp 97.9°F | Ht 64.0 in | Wt 147.0 lb

## 2012-02-07 DIAGNOSIS — G629 Polyneuropathy, unspecified: Secondary | ICD-10-CM

## 2012-02-07 DIAGNOSIS — G2581 Restless legs syndrome: Secondary | ICD-10-CM | POA: Insufficient documentation

## 2012-02-07 DIAGNOSIS — M79609 Pain in unspecified limb: Secondary | ICD-10-CM

## 2012-02-07 DIAGNOSIS — G589 Mononeuropathy, unspecified: Secondary | ICD-10-CM

## 2012-02-07 DIAGNOSIS — Z79899 Other long term (current) drug therapy: Secondary | ICD-10-CM

## 2012-02-07 DIAGNOSIS — E785 Hyperlipidemia, unspecified: Secondary | ICD-10-CM

## 2012-02-07 DIAGNOSIS — M79605 Pain in left leg: Secondary | ICD-10-CM

## 2012-02-07 DIAGNOSIS — I10 Essential (primary) hypertension: Secondary | ICD-10-CM

## 2012-02-07 DIAGNOSIS — M79604 Pain in right leg: Secondary | ICD-10-CM

## 2012-02-07 LAB — CBC WITH DIFFERENTIAL/PLATELET
Basophils Absolute: 0 10*3/uL (ref 0.0–0.1)
Basophils Relative: 0.6 % (ref 0.0–3.0)
Eosinophils Absolute: 0.2 10*3/uL (ref 0.0–0.7)
Eosinophils Relative: 2.6 % (ref 0.0–5.0)
HCT: 42.2 % (ref 36.0–46.0)
Hemoglobin: 13.7 g/dL (ref 12.0–15.0)
Lymphocytes Relative: 24.2 % (ref 12.0–46.0)
Lymphs Abs: 1.6 10*3/uL (ref 0.7–4.0)
MCHC: 32.4 g/dL (ref 30.0–36.0)
MCV: 95.4 fl (ref 78.0–100.0)
Monocytes Absolute: 0.6 10*3/uL (ref 0.1–1.0)
Monocytes Relative: 8.6 % (ref 3.0–12.0)
Neutro Abs: 4.2 10*3/uL (ref 1.4–7.7)
Neutrophils Relative %: 64 % (ref 43.0–77.0)
Platelets: 253 10*3/uL (ref 150.0–400.0)
RBC: 4.42 Mil/uL (ref 3.87–5.11)
RDW: 13.9 % (ref 11.5–14.6)
WBC: 6.6 10*3/uL (ref 4.5–10.5)

## 2012-02-07 LAB — LIPID PANEL
Cholesterol: 153 mg/dL (ref 0–200)
HDL: 61 mg/dL
LDL Cholesterol: 71 mg/dL (ref 0–99)
Total CHOL/HDL Ratio: 3
Triglycerides: 105 mg/dL (ref 0.0–149.0)
VLDL: 21 mg/dL (ref 0.0–40.0)

## 2012-02-07 LAB — BASIC METABOLIC PANEL
CO2: 29 mEq/L (ref 19–32)
Chloride: 101 mEq/L (ref 96–112)
Potassium: 3.9 mEq/L (ref 3.5–5.1)
Sodium: 138 mEq/L (ref 135–145)

## 2012-02-07 LAB — TSH: TSH: 0.33 u[IU]/mL — ABNORMAL LOW (ref 0.35–5.50)

## 2012-02-07 LAB — CK: Total CK: 107 U/L (ref 7–177)

## 2012-02-07 LAB — FOLATE: Folate: 23.9 ng/mL (ref >5.9)

## 2012-02-07 LAB — VITAMIN B12: Vitamin B-12: 238 pg/mL (ref 211–911)

## 2012-02-07 MED ORDER — GABAPENTIN 100 MG PO CAPS: 100.0000 mg | ORAL_CAPSULE | Freq: Two times a day (BID) | ORAL | Status: DC

## 2012-02-07 NOTE — Progress Notes (Signed)
  Subjective:    Patient ID: [name], female    DOB: 1929/11/29, 78 y.o.   MRN: [id number]  HPI CC: Medicare wellness visit, converted to established pt visit  Actually, last medicare wellness visit was 03/20/2011. Converted to established acute visit.  Having leg pains bilaterally mostly behind knee and calves.  Pain described as burning and tingling pain.  Also describes restless sensation in legs.  Denies pains or paresthesias in toes.  Taking sinemet for this (and for h/o periodic limb movement disorder listed in problem list).  Also taking aleve 2 pills bid for leg pain.  Neither seem to be helping.  Also having intermittent R groin pain that comes on as sharp pain without radiation.  Present since summer.  Denies bulging.  + reproducible with palpation.  Resolves with time and with rest.  Depression - very intermittently taking effexor, doesn't think depressed anymore.  Wants to hold on to PRN.  Discussed likely will not help if used PRN. HTN - well controlled on current regimen. HLD - compliant with simvastatin 40mg  daily. Osteoporosis - compliant with cal/vit d bid.  GERD - earlier this year changed from protonix to nexium for some breakthrough sxs of uncomplicated GERD.  Never filled 2/2 cost.  Has cut out acids in diet.  Stable from GERD standpoint.  Ate sugar candy this morning.  Preventative: Mammogram - 03/2010 due for rpt next month. Colon cancer screening -  S/p hysterectomy Td 2005 Pneumovax 2005 zostavax - unsure if wants to have done. [facility]  Decreased hearing to low frequencies noted. Vision best 20/50, worst 20/70 R eye  Past Medical History  Diagnosis Date  . GERD (gastroesophageal reflux disease) 05/1999  . Hyperlipidemia 12/2003  . Hypertension 1990  . Osteoarthritis 08/21/1989  . Osteoporosis 11/1997  . DDD (degenerative disc disease), lumbosacral 12/06/1992   Review of Systems Per HPI    Objective:   Physical Exam  Nursing  note and vitals reviewed. Constitutional: She appears well-developed and well-nourished. No distress.  HENT:  Head: Normocephalic and atraumatic.  Mouth/Throat: Oropharynx is clear and moist. No oropharyngeal exudate.  Neck: Normal range of motion. Neck supple.  Musculoskeletal: She exhibits no edema.       No midline spine tenderness.   No inguinal hernias appreciated today. No pain with int/ext rotation at hips.  Neg FABER. Neg SLR bilat. No pain with hip flexors or abductors against resistance  Foot exam: Normal inspection No skin breakdown No calluses  Normal DP/PT pulses Normal sensation to light touch and monofilament Nails normal  Skin: Skin is warm and dry. No rash noted.       Assessment & Plan:

## 2012-02-07 NOTE — Assessment & Plan Note (Addendum)
What sounds like neuropathy in h/o osteoarthritis and periodic limb movement disorder.  Doesn't quite describe RLS symptoms. Discussed options. Will start treatment for neuropathy with gabapentin, rec stop sinemet as doesn't seem to be helping at all. Check blood work to further evaluation neuropathy. Groin pain - exam normal today.  ?groin strain.  Monitor for now. Drew blood work today as well for [employer] visit.

## 2012-02-07 NOTE — Patient Instructions (Signed)
Call your insurance about the shingles shot to see if it is covered or how much it would cost and where is cheaper (here or pharmacy).  If you want to receive here, call for nurse visit. Return next month after the 12th for medicare wellness visit.  Bring form that you filled out to that appointment. Blood work today to check on leg pains. Let's start gabapentin 100mg  at night time for 4 days then increase to twice daily.  We may increase to 3 times daily if tolerated. Good to see you today, call us with questions.

## 2012-02-11 ENCOUNTER — Encounter: Payer: Self-pay | Admitting: *Deleted

## 2012-02-12 ENCOUNTER — Telehealth: Payer: Self-pay

## 2012-02-12 ENCOUNTER — Other Ambulatory Visit: Payer: Self-pay | Admitting: Family Medicine

## 2012-02-12 NOTE — Telephone Encounter (Signed)
Pt left v/m request recent lab results and if no answer to leave message. Left v/m as instructed from lab result notes also advised pt to receive letter in mail.

## 2012-03-08 HISTORY — PX: OTHER SURGICAL HISTORY: SHX169

## 2012-03-08 HISTORY — PX: US ECHOCARDIOGRAPHY: HXRAD669

## 2012-03-09 ENCOUNTER — Ambulatory Visit (INDEPENDENT_AMBULATORY_CARE_PROVIDER_SITE_OTHER): Payer: Medicare Other | Admitting: Family Medicine

## 2012-03-09 ENCOUNTER — Encounter: Payer: Self-pay | Admitting: Family Medicine

## 2012-03-09 VITALS — BP 132/84 | HR 127 | Temp 97.9°F | Wt 148.8 lb

## 2012-03-09 DIAGNOSIS — M79609 Pain in unspecified limb: Secondary | ICD-10-CM

## 2012-03-09 DIAGNOSIS — R7989 Other specified abnormal findings of blood chemistry: Secondary | ICD-10-CM

## 2012-03-09 DIAGNOSIS — R6889 Other general symptoms and signs: Secondary | ICD-10-CM

## 2012-03-09 DIAGNOSIS — I4891 Unspecified atrial fibrillation: Secondary | ICD-10-CM

## 2012-03-09 DIAGNOSIS — M79605 Pain in left leg: Secondary | ICD-10-CM

## 2012-03-09 DIAGNOSIS — R9431 Abnormal electrocardiogram [ECG] [EKG]: Secondary | ICD-10-CM

## 2012-03-09 DIAGNOSIS — I499 Cardiac arrhythmia, unspecified: Secondary | ICD-10-CM

## 2012-03-09 DIAGNOSIS — M79604 Pain in right leg: Secondary | ICD-10-CM

## 2012-03-09 LAB — TSH: TSH: 0.94 u[IU]/mL (ref 0.35–5.50)

## 2012-03-09 LAB — TROPONIN I: Troponin I: <0.01 ng/mL (ref <0.06)

## 2012-03-09 MED ORDER — METOPROLOL TARTRATE 25 MG PO TABS: 12.5000 mg | ORAL_TABLET | Freq: Two times a day (BID) | ORAL | Status: DC

## 2012-03-09 NOTE — Assessment & Plan Note (Signed)
Continue to monitor for now on gabapentin.

## 2012-03-09 NOTE — Patient Instructions (Addendum)
Return in 1 month for medicare wellness visit and bring questionairre. EKG today. Start metoprolol 12.5mg  twice daily. Start aspirin daily, we may discuss starting blood thinner like coumadin in near future. Blood work today to check thyroid again which could be contributing to irregular and fast heart beat. Pass by Marion's office to schedule cardiology referral. If at any point, worsening chest pain or tightness or shortness of breath, please seek urgent care.

## 2012-03-09 NOTE — Assessment & Plan Note (Signed)
Recheck today. Even if subclinical, may merit treatment given new afib.

## 2012-03-09 NOTE — Progress Notes (Signed)
  Subjective:    Patient ID: [name], female    DOB: 1929-11-02, 78 y.o.   MRN: [id number]  HPI CC: 1 mo f/u  Was all night with son in law and daughter - he had his appendix rupture over weekend. Brother not doing well in [place], [place] - throat cancer. 8 wks ago healthy step son - dx with chronic leukemia (62yo). Stressed with all these family health issues going on.  Last visit seen here with leg pain - thought due to neuropathy - found to have mildly low B12.  Started on gabapentin, stopped sinemet.  Started on b12 oral daily.  Legs continue to hurt - from knee to ankle, no pain at feet.  Mainly at calves.  Pain at rest, worse at night sometimes don't let her sleep.  Taking aleve.  Pain described as ache, no burning.  Walks floor to relieve pain.  Bilaterally symmetrical sxs.  Having several different pustules popping up around body - intermittent over last several days.  Last one started L posterior upper arm - using neosporin and peroxide to treat spots.  Has had one on leg 1 yr ago and one on left index finger recently.  Tachycardia today - with irregular heart beat, concern for afib.  No h/o afib.  Denies skipped beats, chest pain/tightness.  Endorses intermittent SOB over last few months.  More easily fatigued as well as DOE noted, attributed to age.  Also endorses palpitations for last 2-3 months.  Notes has remained very active last several months despite sxs.  Denies current SOB, chest pain or tightness.  Past Medical History  Diagnosis Date  . GERD (gastroesophageal reflux disease) 05/1999  . Hyperlipidemia 12/2003  . Hypertension 1990  . Osteoarthritis 08/21/1989  . Osteoporosis 11/1997  . DDD (degenerative disc disease), lumbosacral 12/06/1992   Medications and allergies reviewed and updated in chart.  Review of Systems Per HPI    Objective:   Physical Exam  Nursing note and vitals reviewed. Constitutional: She appears well-developed and well-nourished.  No distress.  HENT:  Mouth/Throat: Oropharynx is clear and moist. No oropharyngeal exudate.  Cardiovascular: Normal heart sounds and intact distal pulses.  An irregularly irregular rhythm present. Tachycardia present.   No murmur heard. Pulmonary/Chest: Effort normal and breath sounds normal. No respiratory distress. She has no wheezes. She has no rales.  Musculoskeletal: She exhibits no edema.       No calf pain to palpation, no palpable cords.  Skin: Skin is warm and dry. No rash noted.  Psychiatric: She has a normal mood and affect.       Assessment & Plan:

## 2012-03-09 NOTE — Assessment & Plan Note (Addendum)
New onset afib/flutter, with RVR, however suprisingly very well compensated as currently with tachycardic but tolerating this very well. Recommended start aspirin daily, as well as prescribed metoprolol to start at 12.5mg  twice daily. Refer ASAP to cards for further eval. CHADS2 = 2, will likely need anticoagulation, discussed this.  Start today with aspirin which she states she has at home. Check Troponin today given abnormal EKG - if elevated, will send to ER today. Recheck TSH as last month's check was mildly low, ?hyperthyroidism contributing. No other abnormalities noted from recent blood work. Pt aware to call 911 and go to ER if any new chest pain/tightness, or worsening SOB or DOE.  I feel she is currently stable enough to undergo outpatient evaluation.  EKG - atrial fib/flutter at rate of 130s, lateral ST depression

## 2012-03-12 ENCOUNTER — Ambulatory Visit (INDEPENDENT_AMBULATORY_CARE_PROVIDER_SITE_OTHER): Payer: Medicare Other | Admitting: Cardiovascular Disease

## 2012-03-12 ENCOUNTER — Encounter: Payer: Self-pay | Admitting: Cardiovascular Disease

## 2012-03-12 VITALS — BP 124/68 | HR 62 | Ht 65.0 in | Wt 147.8 lb

## 2012-03-12 DIAGNOSIS — R0602 Shortness of breath: Secondary | ICD-10-CM

## 2012-03-12 DIAGNOSIS — R0789 Other chest pain: Secondary | ICD-10-CM

## 2012-03-12 DIAGNOSIS — I4892 Unspecified atrial flutter: Secondary | ICD-10-CM

## 2012-03-12 DIAGNOSIS — R079 Chest pain, unspecified: Secondary | ICD-10-CM

## 2012-03-12 MED ORDER — RIVAROXABAN 20 MG PO TABS: 20.0000 mg | ORAL_TABLET | Freq: Every day | ORAL | Status: DC

## 2012-03-12 NOTE — Patient Instructions (Addendum)
Start Xarelto 20 mg once daily. (blood thinner).  Stop Aspirin.  Do not take nonsteroidal antiinflammatory medications. Can use Tylenol.   Your physician has requested that you have an echocardiogram. Echocardiography is a painless test that uses sound waves to create images of your heart. It provides your doctor with information about the size and shape of your heart and how well your heart's chambers and valves are working. This procedure takes approximately one hour. There are no restrictions for this procedure.  Your physician has requested that you have en exercise stress myoview. For further information please visit [email or web address] Please follow instruction sheet, as given.  Follow up after tests.   [facility] MYOVIEW  Your caregiver has ordered a Stress Test with nuclear imaging. The purpose of this test is to evaluate the blood supply to your heart muscle. This procedure is referred to as a "Non-Invasive Stress Test." This is because other than having an IV started in your vein, nothing is inserted or "invades" your body. Cardiac stress tests are done to find areas of poor blood flow to the heart by determining the extent of coronary artery disease (CAD). Some patients exercise on a treadmill, which naturally increases the blood flow to your heart, while others who are  unable to walk on a treadmill due to physical limitations have a pharmacologic/chemical stress agent called Lexiscan . This medicine will mimic walking on a treadmill by temporarily increasing your coronary blood flow.   Please note: these test may take anywhere between 2-4 hours to complete  PLEASE REPORT TO [facility]TR[facility]  THE VOLUNTEERS AT THE FIRST DESK WILL DIRECT YOU WHERE TO GO  Date of Procedure:_______________12/13/13______________________  Arrival Time for Procedure:______________7:15 am________________  Instructions regarding medication:   __n/a__ : Hold diabetes medication morning of  procedure  ___x_:  Hold betablocker(s) night before procedure and morning of procedure (lopressor/metoprolol)  _n/a___:  Hold other medications as follows:_________________________________________________________________________________________________________________________________________________________________________________________________________________________________________________________________________________________  PLEASE NOTIFY THE OFFICE AT LEAST 24 HOURS IN ADVANCE IF YOU ARE UNABLE TO KEEP YOUR APPOINTMENT.  [phone]  How to prepare for your Myoview test:  1. Do not eat or drink after midnight 2. No caffeine for 24 hours prior to test 3. Your medication may be taken with water.  If your doctor stopped a medication because of this test, do not take that medication. 4. Ladies, please do not wear dresses.  Skirts or pants are appropriate. Please wear a short sleeve shirt. 5. No perfume, cologne or lotion. 6. Wear comfortable walking shoes. No heels!

## 2012-03-14 ENCOUNTER — Other Ambulatory Visit: Payer: Self-pay | Admitting: Family Medicine

## 2012-03-16 ENCOUNTER — Encounter: Payer: Self-pay | Admitting: Cardiovascular Disease

## 2012-03-16 DIAGNOSIS — R0789 Other chest pain: Secondary | ICD-10-CM | POA: Insufficient documentation

## 2012-03-16 DIAGNOSIS — I4819 Other persistent atrial fibrillation: Secondary | ICD-10-CM

## 2012-03-16 DIAGNOSIS — I4821 Permanent atrial fibrillation: Secondary | ICD-10-CM

## 2012-03-16 HISTORY — DX: Other persistent atrial fibrillation: I48.19

## 2012-03-16 HISTORY — DX: Permanent atrial fibrillation: I48.21

## 2012-03-16 NOTE — Assessment & Plan Note (Signed)
I reviewed the patient's recent ECG which showed atrial flutter with rapid ventricular response. This is her first documented episode of atrial flutter. However, by her symptoms it appears that this is paroxysmal with few months history of intermittent palpitations. She is currently in normal sinus rhythm. CHADS score is 2. Thus, she is at moderate risk for thromboembolic complications. Her bleeding risk is overall low. Due to that, I recommend long-term oral anticoagulation. I discussed with her different options and we decided to start Xarelto 20 mg once daily. She had recent labs which were overall unremarkable. I will obtain an echocardiogram to evaluate atrial size and possible associated structural abnormalities.

## 2012-03-16 NOTE — Progress Notes (Signed)
Primary care physician: Dr. Sharen Hones.  HPI  This is a pleasant 78 year old female who is referred by Dr. Sharen Hones for evaluation of newly diagnosed atrial flutter. The patient is not aware of any previous cardiac history. She has no history of heart failure, CAD or stroke. She's been overall healthy and active throughout her life. She has known history of hypertension. She has been under significant stress lately due to illness in family members. During her recent routine visit with Dr. Sharen Hones, she was found to be tachycardic. ECG showed atrial flutter with variable AV block and rapid ventricular response. The patient has been experiencing intermittent palpitations for a few months. She denies significant dyspnea. She reports to me that she's been having intermittent episodes of substernal chest tightness lasting for a few minutes which can happen any time and don't have any patterns. She was started recently on metoprolol 12.5 mg twice daily.  Allergies  Allergen Reactions  . Penicillins     REACTION: RASH     Current Outpatient Prescriptions on File Prior to Visit  Medication Sig Dispense Refill  . acyclovir (ZOVIRAX) 400 MG tablet Take 400 mg by mouth daily.        Marland Kitchen amLODipine (NORVASC) 10 MG tablet TAKE 1 TABLET (10 MG TOTAL) BY MOUTH DAILY.  30 tablet  3  . beta carotene 15 MG capsule Take 15 mg by mouth daily.        . calcium-vitamin D (OSCAL WITH D) 500-200 MG-UNIT per tablet Take 1 tablet by mouth daily.        . cholecalciferol (VITAMIN D) 1000 UNITS tablet Take 1,000 Units by mouth daily.       . Coenzyme Q10 (COQ10) 200 MG CAPS Take by mouth daily.        . cyanocobalamin ([employer] VITAMIN B12) 1000 MCG tablet Take 1,000 mcg by mouth daily.      . diphenhydrAMINE (BENADRYL) 25 MG tablet Take 25 mg by mouth daily.      . fish oil-omega-3 fatty acids 1000 MG capsule Take 1 g by mouth 2 (two) times daily.        Marland Kitchen gabapentin (NEURONTIN) 100 MG capsule Take 1 capsule (100 mg  total) by mouth 2 (two) times daily.  90 capsule  3  . glucosamine-chondroitin 500-400 MG tablet Take 1 tablet by mouth daily.        . metoprolol tartrate (LOPRESSOR) 25 MG tablet Take 0.5 tablets (12.5 mg total) by mouth 2 (two) times daily.  30 tablet  3  . naproxen sodium (ANAPROX) 220 MG tablet Take 2 tablets before breakfast and supper as needed.       . pantoprazole (PROTONIX) 40 MG tablet Take 40 mg by mouth daily.      . prednisoLONE acetate (PRED FORTE) 1 % ophthalmic suspension As directed.       . simvastatin (ZOCOR) 40 MG tablet TAKE 1 TABLET BY MOUTH AT NIGHT  30 tablet  10  . venlafaxine (EFFEXOR) 37.5 MG tablet Take 37.5 mg by mouth daily.        . hydrochlorothiazide (MICROZIDE) 12.5 MG capsule TAKE ONE CAPSULE DAILY  30 capsule  11  . Rivaroxaban (XARELTO) 20 MG TABS Take 1 tablet (20 mg total) by mouth daily.  30 tablet  6     Past Medical History  Diagnosis Date  . GERD (gastroesophageal reflux disease) 05/1999  . Hyperlipidemia 12/2003  . Hypertension 1990  . Osteoarthritis 08/21/1989  . Osteoporosis 11/1997  .  DDD (degenerative disc disease), lumbosacral 12/06/1992  . History of shingles     ophthalmic, takes acyclovir daily  . Cancer     skin cancer     Past Surgical History  Procedure Date  . Abdominal hysterectomy Age 67 - 66    S/P TAH  . Cervical discectomy 1992    Fusion (Dr. Jeral Fruit)  . Esophagogastroduodenoscopy 01/01/2002    with ulcer, bx. neg; + stricture gastric ulcer with hemorrhage  . Esophagogastroduodenoscopy 04/23/2004    Esophageal stricture -- dilated  . Abdominal ultrasound 04/23/2004    Gallstones  . Cataract extraction 05/2010    left eye  . Skin cancer excision 05/20/2011    BCC from nose     Family History  Problem Relation Age of Onset  . Emphysema Mother   . Heart disease Father     MI  . Stroke Father     first at age 14.  . Stroke Sister   . Diabetes Sister   . Hypertension Brother   . Heart disease Brother      Heart Valve  . Stroke Brother   . Diabetes Brother   . Cancer Brother     esophageal  . Diabetes Brother   . Diabetes Brother      History   Social History  . Marital Status: Married    Spouse Name: N/A    Number of Children: 5  . Years of Education: N/A   Occupational History  . Retired - [employer]    Social History Main Topics  . Smoking status: Light Tobacco Smoker -- 0.2 packs/day for 1 years  . Smokeless tobacco: Never Used     Comment: pt smokes occasionally "socially"  . Alcohol Use: 0.5 oz/week    1 drink(s) per week     Comment: wine occassionally  . Drug Use: No  . Sexually Active: No   Other Topics Concern  . Not on file   Social History Narrative   From [place].  Widowed 01/1999; 1st marriage 25 years / 2nd marriage 13 years.One son deceased 2023-01-20.     ROS Constitutional: Negative for fever, chills, diaphoresis, activity change, appetite change. HENT: Negative for hearing loss, nosebleeds, congestion, sore throat, facial swelling, drooling, trouble swallowing, neck pain, voice change, sinus pressure and tinnitus.  Eyes: Negative for photophobia, pain, discharge and visual disturbance.  Respiratory: Negative for apnea, cough, shortness of breath and wheezing.  Cardiovascular: Negative for leg swelling.  Gastrointestinal: Negative for nausea, vomiting, abdominal pain, diarrhea, constipation, blood in stool and abdominal distention.  Genitourinary: Negative for dysuria, urgency, frequency, hematuria and decreased urine volume.  Musculoskeletal: Negative for myalgias, back pain, joint swelling, arthralgias and gait problem.  Skin: Negative for color change, pallor, rash and wound.  Neurological: Negative for dizziness, tremors, seizures, syncope, speech difficulty, weakness, light-headedness, numbness and headaches.  Psychiatric/Behavioral: Negative for suicidal ideas, hallucinations, behavioral problems and agitation. The patient is not nervous/anxious.      PHYSICAL EXAM   BP 124/68  Pulse 62  Ht 5\' 5"  (1.651 m)  Wt 147 lb 12 oz (67.019 kg)  BMI 24.59 kg/m2  Constitutional: She is oriented to person, place, and time. She appears well-developed and well-nourished. No distress.  HENT: No nasal discharge.  Head: Normocephalic and atraumatic.  Eyes: Pupils are equal and round. Right eye exhibits no discharge. Left eye exhibits no discharge.  Neck: Normal range of motion. Neck supple. No JVD present. No thyromegaly present.  Cardiovascular: Normal rate, regular rhythm, normal  heart sounds. Exam reveals no gallop and no friction rub. No murmur heard.  Pulmonary/Chest: Effort normal and breath sounds normal. No stridor. No respiratory distress. She has no wheezes. She has no rales. She exhibits no tenderness.  Abdominal: Soft. Bowel sounds are normal. She exhibits no distension. There is no tenderness. There is no rebound and no guarding.  Musculoskeletal: Normal range of motion. She exhibits no edema and no tenderness.  Neurological: She is alert and oriented to person, place, and time. Coordination normal.  Skin: Skin is warm and dry. No rash noted. She is not diaphoretic. No erythema. No pallor.  Psychiatric: She has a normal mood and affect. Her behavior is normal. Judgment and thought content normal.    EKG: Normal sinus rhythm with no significant ST or T wave changes.   ASSESSMENT AND PLAN

## 2012-03-16 NOTE — Assessment & Plan Note (Signed)
The patient reports recent chest pain which seems to be overall atypical. There might be a component of anxiety. However, due to her risk factors, I recommend further evaluation with a treadmill nuclear stress test.

## 2012-03-20 ENCOUNTER — Ambulatory Visit: Payer: Self-pay | Admitting: Cardiovascular Disease

## 2012-03-20 DIAGNOSIS — R079 Chest pain, unspecified: Secondary | ICD-10-CM

## 2012-03-24 ENCOUNTER — Other Ambulatory Visit: Payer: Self-pay

## 2012-03-24 ENCOUNTER — Other Ambulatory Visit (INDEPENDENT_AMBULATORY_CARE_PROVIDER_SITE_OTHER): Payer: Medicare Other

## 2012-03-24 ENCOUNTER — Other Ambulatory Visit: Payer: Self-pay | Admitting: *Deleted

## 2012-03-24 DIAGNOSIS — I4892 Unspecified atrial flutter: Secondary | ICD-10-CM

## 2012-03-24 DIAGNOSIS — R0602 Shortness of breath: Secondary | ICD-10-CM

## 2012-03-24 DIAGNOSIS — R079 Chest pain, unspecified: Secondary | ICD-10-CM

## 2012-03-27 ENCOUNTER — Ambulatory Visit: Payer: Medicare Other | Admitting: Cardiovascular Disease

## 2012-04-06 ENCOUNTER — Encounter: Payer: Self-pay | Admitting: Family Medicine

## 2012-04-06 ENCOUNTER — Other Ambulatory Visit: Payer: Self-pay | Admitting: Family Medicine

## 2012-04-09 ENCOUNTER — Encounter: Payer: Self-pay | Admitting: Cardiovascular Disease

## 2012-04-09 ENCOUNTER — Other Ambulatory Visit: Payer: Medicare Other

## 2012-04-09 ENCOUNTER — Ambulatory Visit (INDEPENDENT_AMBULATORY_CARE_PROVIDER_SITE_OTHER): Payer: Medicare Other | Admitting: Cardiovascular Disease

## 2012-04-09 VITALS — BP 128/70 | HR 75 | Ht 64.0 in | Wt 143.8 lb

## 2012-04-09 DIAGNOSIS — I4892 Unspecified atrial flutter: Secondary | ICD-10-CM

## 2012-04-09 DIAGNOSIS — E785 Hyperlipidemia, unspecified: Secondary | ICD-10-CM

## 2012-04-09 DIAGNOSIS — R0789 Other chest pain: Secondary | ICD-10-CM

## 2012-04-09 DIAGNOSIS — I10 Essential (primary) hypertension: Secondary | ICD-10-CM

## 2012-04-09 MED ORDER — ATORVASTATIN CALCIUM 20 MG PO TABS: 20.0000 mg | ORAL_TABLET | Freq: Every day | ORAL | Status: DC

## 2012-04-09 MED ORDER — METOPROLOL TARTRATE 25 MG PO TABS: 25.0000 mg | ORAL_TABLET | Freq: Two times a day (BID) | ORAL | Status: DC

## 2012-04-09 MED ORDER — AMLODIPINE BESYLATE 5 MG PO TABS: 5.0000 mg | ORAL_TABLET | Freq: Every day | ORAL | Status: DC

## 2012-04-09 NOTE — Patient Instructions (Addendum)
Increase Metoprolol to 25 mg twice daily.  Decrease Amlodipine to 5 mg once daily (1/2 tablet of 10) Stop Simvastatin Start Atorvastatin 20 mg once daily.  Follow up in 3 months.

## 2012-04-11 ENCOUNTER — Encounter: Payer: Self-pay | Admitting: Cardiovascular Disease

## 2012-04-11 NOTE — Assessment & Plan Note (Signed)
Due to her complaints of myalgia and interaction between simvastatin amlodipine, I will switch her to atorvastatin 20 mg daily to see if it's better tolerated.

## 2012-04-11 NOTE — Assessment & Plan Note (Signed)
Nuclear stress test showed no evidence of ischemia. She did have ST changes with treadmill exercise which is likely false-positive. She reports no further chest pain.

## 2012-04-11 NOTE — Assessment & Plan Note (Signed)
She is currently in normal sinus rhythm but she was noted to be in atrial flutter or fibrillation during echocardiogram with a heart rate of 122 beats per minute. Thus, I will increase the dose of metoprolol to 25 mg twice daily. Continue anticoagulation with Xarelto which seems to be well tolerated so far. I will repeat CBC and basic metabolic profile in few months.

## 2012-04-11 NOTE — Assessment & Plan Note (Signed)
Her blood pressure is well controlled. However, given that I am increasing the dose of metoprolol, I will decrease amlodipine 5 mg daily.

## 2012-04-11 NOTE — Progress Notes (Signed)
Primary care physician: Dr. Sharen Hones.  HPI  This is a pleasant 79 year old female who is here today for a followup visit regarding atrial flutter.  She has no history of heart failure, CAD or stroke. She's been overall healthy and active throughout her life. She has known history of hypertension. She has been under significant stress lately due to illness in family members. During a routine visit with Dr. Sharen Hones, she was found to be tachycardic. ECG showed atrial flutter with variable AV block and rapid ventricular response. The patient has been experiencing intermittent palpitations for a few months. She denies significant dyspnea. She had few episodes of atypical chest pain. She was started on metoprolol 12.5 mg once daily by Dr. Sharen Hones. She was noted to be in sinus rhythm when I first saw her. She underwent a nuclear stress test which showed no evidence of ischemia. Echocardiogram showed normal LV systolic function, mild to moderate aortic and mitral regurgitation, moderate tricuspid regurgitation with mild pulmonary hypertension. During the echocardiogram, she was noted to be tachycardic and likely in atrial flutter with a heart rate of 122 beats per minute. Overall, she feels better. No more chest pain. No reported palpitations. She does complain of significant myalgia.  Allergies  Allergen Reactions  . Penicillins     REACTION: RASH     Current Outpatient Prescriptions on File Prior to Visit  Medication Sig Dispense Refill  . acyclovir (ZOVIRAX) 400 MG tablet Take 400 mg by mouth daily.        . beta carotene 15 MG capsule Take 15 mg by mouth daily.        . calcium-vitamin D (OSCAL WITH D) 500-200 MG-UNIT per tablet Take 1 tablet by mouth daily.        . cholecalciferol (VITAMIN D) 1000 UNITS tablet Take 1,000 Units by mouth daily.       . Coenzyme Q10 (COQ10) 200 MG CAPS Take by mouth daily.        . cyanocobalamin ([employer]S VITAMIN B12) 1000 MCG tablet Take 1,000 mcg by mouth  daily.      . diphenhydrAMINE (BENADRYL) 25 MG tablet Take 25 mg by mouth daily.      . fish oil-omega-3 fatty acids 1000 MG capsule Take 1 g by mouth 2 (two) times daily.        Marland Kitchen gabapentin (NEURONTIN) 100 MG capsule Take 1 capsule (100 mg total) by mouth 2 (two) times daily.  90 capsule  3  . glucosamine-chondroitin 500-400 MG tablet Take 1 tablet by mouth daily.        . hydrochlorothiazide (MICROZIDE) 12.5 MG capsule TAKE ONE CAPSULE DAILY  30 capsule  11  . metoprolol tartrate (LOPRESSOR) 25 MG tablet Take 1 tablet (25 mg total) by mouth 2 (two) times daily.  60 tablet  6  . naproxen sodium (ANAPROX) 220 MG tablet Take 2 tablets before breakfast and supper as needed.       . pantoprazole (PROTONIX) 40 MG tablet Take 40 mg by mouth daily.      . prednisoLONE acetate (PRED FORTE) 1 % ophthalmic suspension As directed.       . Rivaroxaban (XARELTO) 20 MG TABS Take 1 tablet (20 mg total) by mouth daily.  30 tablet  6  . venlafaxine (EFFEXOR) 37.5 MG tablet Take 37.5 mg by mouth daily.        Marland Kitchen atorvastatin (LIPITOR) 20 MG tablet Take 1 tablet (20 mg total) by mouth daily.  30 tablet  6  Past Medical History  Diagnosis Date  . GERD (gastroesophageal reflux disease) 05/1999  . Hyperlipidemia 12/2003  . Hypertension 1990  . Osteoarthritis 08/21/1989  . Osteoporosis 11/1997  . DDD (degenerative disc disease), lumbosacral 12/06/1992  . History of shingles     ophthalmic, takes acyclovir daily  . Cancer     skin cancer  . Paroxysmal atrial flutter 03/16/2012    on xarelto     Past Surgical History  Procedure Date  . Abdominal hysterectomy Age 37 - 6    S/P TAH  . Cervical discectomy 1992    Fusion (Dr. Jeral Fruit)  . Esophagogastroduodenoscopy 01/01/2002    with ulcer, bx. neg; + stricture gastric ulcer with hemorrhage  . Esophagogastroduodenoscopy 04/23/2004    Esophageal stricture -- dilated  . Abdominal ultrasound 04/23/2004    Gallstones  . Cataract extraction 05/2010     left eye  . Skin cancer excision 05/20/2011    BCC from nose  . US echocardiography 03/2012    in flutter, EF 60%, mild-mod aortic, mitral, tricuspid regurg, mildly dilated LA     Family History  Problem Relation Age of Onset  . Emphysema Mother   . Heart disease Father     MI  . Stroke Father     first at age 62.  . Stroke Sister   . Diabetes Sister   . Hypertension Brother   . Heart disease Brother     Heart Valve  . Stroke Brother   . Diabetes Brother   . Cancer Brother     esophageal  . Diabetes Brother   . Diabetes Brother      History   Social History  . Marital Status: Married    Spouse Name: N/A    Number of Children: 5  . Years of Education: N/A   Occupational History  . Retired - [employer]    Social History Main Topics  . Smoking status: Light Tobacco Smoker -- 0.2 packs/day for 1 years  . Smokeless tobacco: Never Used     Comment: pt smokes occasionally "socially"  . Alcohol Use: 0.5 oz/week    1 drink(s) per week     Comment: wine occassionally  . Drug Use: No  . Sexually Active: No   Other Topics Concern  . Not on file   Social History Narrative   From [place].  Widowed 01/1999; 1st marriage 25 years / 2nd marriage 13 years.One son deceased 02/01/23.       PHYSICAL EXAM   BP 128/70  Pulse 75  Ht 5\' 4"  (1.626 m)  Wt 143 lb 12 oz (65.205 kg)  BMI 24.67 kg/m2  Constitutional: She is oriented to person, place, and time. She appears well-developed and well-nourished. No distress.  HENT: No nasal discharge.  Head: Normocephalic and atraumatic.  Eyes: Pupils are equal and round. Right eye exhibits no discharge. Left eye exhibits no discharge.  Neck: Normal range of motion. Neck supple. No JVD present. No thyromegaly present.  Cardiovascular: Normal rate, regular rhythm, normal heart sounds. Exam reveals no gallop and no friction rub. No murmur heard.  Pulmonary/Chest: Effort normal and breath sounds normal. No stridor. No respiratory  distress. She has no wheezes. She has no rales. She exhibits no tenderness.  Abdominal: Soft. Bowel sounds are normal. She exhibits no distension. There is no tenderness. There is no rebound and no guarding.  Musculoskeletal: Normal range of motion. She exhibits no edema and no tenderness.  Neurological: She is alert and oriented to person,  place, and time. Coordination normal.  Skin: Skin is warm and dry. No rash noted. She is not diaphoretic. No erythema. No pallor.  Psychiatric: She has a normal mood and affect. Her behavior is normal. Judgment and thought content normal.    EKG: Sinus  Rhythm  - occasional PAC    # PACs = 1. -Nonspecific ST depression  -Nondiagnostic.   ABNORMAL   ASSESSMENT AND PLAN

## 2012-04-14 ENCOUNTER — Ambulatory Visit (INDEPENDENT_AMBULATORY_CARE_PROVIDER_SITE_OTHER): Payer: Medicare Other | Admitting: Family Medicine

## 2012-04-14 ENCOUNTER — Encounter: Payer: Self-pay | Admitting: Family Medicine

## 2012-04-14 VITALS — BP 136/88 | HR 105 | Temp 97.6°F | Wt 146.2 lb

## 2012-04-14 DIAGNOSIS — Z0000 Encounter for general adult medical examination without abnormal findings: Secondary | ICD-10-CM | POA: Insufficient documentation

## 2012-04-14 DIAGNOSIS — Z1211 Encounter for screening for malignant neoplasm of colon: Secondary | ICD-10-CM

## 2012-04-14 DIAGNOSIS — I4892 Unspecified atrial flutter: Secondary | ICD-10-CM

## 2012-04-14 DIAGNOSIS — E785 Hyperlipidemia, unspecified: Secondary | ICD-10-CM

## 2012-04-14 DIAGNOSIS — F329 Major depressive disorder, single episode, unspecified: Secondary | ICD-10-CM

## 2012-04-14 DIAGNOSIS — I10 Essential (primary) hypertension: Secondary | ICD-10-CM

## 2012-04-14 DIAGNOSIS — R7989 Other specified abnormal findings of blood chemistry: Secondary | ICD-10-CM

## 2012-04-14 DIAGNOSIS — Z1231 Encounter for screening mammogram for malignant neoplasm of breast: Secondary | ICD-10-CM

## 2012-04-14 NOTE — Assessment & Plan Note (Signed)
Reviewed #s.  normal

## 2012-04-14 NOTE — Assessment & Plan Note (Signed)
I have personally reviewed the Medicare Annual Wellness questionnaire and have noted 1. The patient's medical and social history 2. Their use of alcohol, tobacco or illicit drugs 3. Their current medications and supplements 4. The patient's functional ability including ADL's, fall risks, home safety risks and hearing or visual impairment. 5. Diet and physical activity 6. Evidence for depression or mood disorders The patients weight, height, BMI have been recorded in the chart.  Hearing and vision has been addressed. I have made referrals, counseling and provided education to the patient based review of the above and I have provided the pt with a written personalized care plan for preventive services. See scanned questionairre. Advanced directives discussed: Has living will at home - son is HCPOA.  Does not want extended life support.  Has brother on extended life support.  Reviewed preventative protocols and updated unless pt declined. Td next year. Schedule mammo this year.   iFOB today.

## 2012-04-14 NOTE — Assessment & Plan Note (Signed)
Again out of sinus rhythm today. HR mildly elevated to 105, pt seems to be tolerating well. Metoprolol only recently increased to 25mg  bid.   Will not make changes to meds today. Consider increase in beta blocker if remaining tachycardic as room to go up.

## 2012-04-14 NOTE — Assessment & Plan Note (Signed)
Will need FLP in around 6 mo.

## 2012-04-14 NOTE — Patient Instructions (Addendum)
Stop beta carotene for now, check with eye doctor about this. May stop fish oil.  Try to get fish in the diet. Pass by [facility]'s office to schedule mammogram. Stool kit today. Pick up lower norvasc dose at pharmacy so you don't have to cut this pill in half. May try backing down on effexor, but have to go slowly so down to 1 pill daily for a few weeks then 1/2 pill daily for a few weeks then come off and see how mood is doing. Check with eye doctor about shingles shot.

## 2012-04-14 NOTE — Assessment & Plan Note (Signed)
Stable. Requests to come off effexor-  Discussed how to taper off.

## 2012-04-14 NOTE — Assessment & Plan Note (Signed)
Chronic, stable 

## 2012-04-14 NOTE — Progress Notes (Signed)
Subjective:    Patient ID: [name], female    DOB: 1929-10-29, 79 y.o.   MRN: [id number]  HPI CC: medicare wellness exam  Seen by Dr. Kirke Corin, reviewed med changes, pt tolerating well.  Still with some muscle pain in legs (recent switch from simva to lipitor).  Nuclear stress test without evidence of ischemia.    Remote smoking hx (minimal).  Takes beta carotene.    Hearing - passed.  Pt endorses hearing difficulties but declines audiology eval currently. Vision screen - failed.  To see eye doctor in 3 months.   Denies depression, sadness, anhedonia. 1 fall in last year, fell backwards when pulling large rock in yard - mechanical fall.  Preventative: Last CPE 03/20/11 She had cataract surgery, one in May OS and OD in Sep 2012.  She has had a hysterectomy. We have stopped Pap smears.  Her last mammo was 12/11.  Would like to schedule. Colon cancer screening - would like iFOB.  Never any problems. Flu - 2012 Td - 2005 Pneumovax 2005 zostavax - not interested in zostavax - h/o ocular shingles. Has living will at home - son is HCPOA.  Does not want extended life support.  Has brother on extended life support.  Lives with grandson who smokes, dog From [place].  Widowed 01/1999; 1st marriage 25 years / 2nd marriage 13 years. One son deceased 01/16/2023. Activity: active with dog Diet: good water, fruits/vegetables daily  Medications and allergies reviewed and updated in chart.  Past histories reviewed and updated if relevant as below. Patient Active Problem List  Diagnosis  . POSTHERPETIC NEURALGIA  . HERPES ZOSTER  . HYPERLIPIDEMIA  . DEPRESSIVE DISORDER  . PERIODIC LIMB MOVEMENT DISORDER  . HYPERTENSION  . GERD  . GASTRIC ULCER  . OSTEOARTHRITIS  . BACK PAIN  . OSTEOPOROSIS  . Leg pain, bilateral  . Atrial fibrillation  . Abnormal TSH  . Paroxysmal atrial flutter  . Atypical chest pain   Past Medical History  Diagnosis Date  . GERD (gastroesophageal reflux  disease) 05/1999  . Hyperlipidemia 12/2003  . Hypertension 1990  . Osteoarthritis 08/21/1989  . Osteoporosis 11/1997  . DDD (degenerative disc disease), lumbosacral 12/06/1992  . History of shingles     ophthalmic, takes acyclovir daily  . Cancer     skin cancer  . Paroxysmal atrial flutter 03/16/2012    on xarelto   Past Surgical History  Procedure Date  . Abdominal hysterectomy Age 80 - 57    S/P TAH  . Cervical discectomy 1992    Fusion (Dr. Jeral Fruit)  . Esophagogastroduodenoscopy 01/01/2002    with ulcer, bx. neg; + stricture gastric ulcer with hemorrhage  . Esophagogastroduodenoscopy 04/23/2004    Esophageal stricture -- dilated  . Abdominal ultrasound 04/23/2004    Gallstones  . Cataract extraction 05/2010    left eye  . Skin cancer excision 05/20/2011    BCC from nose  . US echocardiography 03/2012    in flutter, EF 60%, mild-mod aortic, mitral, tricuspid regurg, mildly dilated LA   History  Substance Use Topics  . Smoking status: Former Smoker -- 0.2 packs/day for 1 years  . Smokeless tobacco: Never Used     Comment: pt smokes occasionally "socially"  . Alcohol Use: 0.5 oz/week    1 drink(s) per week     Comment: wine occassionally   Family History  Problem Relation Age of Onset  . Emphysema Mother   . Heart disease Father     MI  .  Stroke Father     first at age 40.  . Stroke Sister   . Diabetes Sister   . Hypertension Brother   . Heart disease Brother     Heart Valve  . Stroke Brother   . Diabetes Brother   . Cancer Brother     esophageal  . Diabetes Brother   . Diabetes Brother    Allergies  Allergen Reactions  . Penicillins     REACTION: RASH   Current Outpatient Prescriptions on File Prior to Visit  Medication Sig Dispense Refill  . acyclovir (ZOVIRAX) 400 MG tablet Take 400 mg by mouth daily.        Marland Kitchen amLODipine (NORVASC) 5 MG tablet Take 1 tablet (5 mg total) by mouth daily.  30 tablet  6  . atorvastatin (LIPITOR) 20 MG tablet Take 1  tablet (20 mg total) by mouth daily.  30 tablet  6  . calcium-vitamin D (OSCAL WITH D) 500-200 MG-UNIT per tablet Take 1 tablet by mouth daily.        . cholecalciferol (VITAMIN D) 1000 UNITS tablet Take 1,000 Units by mouth daily.       . Coenzyme Q10 (COQ10) 200 MG CAPS Take by mouth daily.        . cyanocobalamin ([employer] VITAMIN B12) 1000 MCG tablet Take 1,000 mcg by mouth daily.      . diphenhydrAMINE (BENADRYL) 25 MG tablet Take 25 mg by mouth daily.      . fish oil-omega-3 fatty acids 1000 MG capsule Take 1 g by mouth 2 (two) times daily.        Marland Kitchen gabapentin (NEURONTIN) 100 MG capsule Take 1 capsule (100 mg total) by mouth 2 (two) times daily.  90 capsule  3  . glucosamine-chondroitin 500-400 MG tablet Take 1 tablet by mouth daily.        . hydrochlorothiazide (MICROZIDE) 12.5 MG capsule TAKE ONE CAPSULE DAILY  30 capsule  11  . metoprolol tartrate (LOPRESSOR) 25 MG tablet Take 1 tablet (25 mg total) by mouth 2 (two) times daily.  60 tablet  6  . naproxen sodium (ANAPROX) 220 MG tablet Take 2 tablets before breakfast and supper as needed.       . pantoprazole (PROTONIX) 40 MG tablet Take 40 mg by mouth daily.      . prednisoLONE acetate (PRED FORTE) 1 % ophthalmic suspension As directed.       . Rivaroxaban (XARELTO) 20 MG TABS Take 1 tablet (20 mg total) by mouth daily.  30 tablet  6  . venlafaxine (EFFEXOR) 37.5 MG tablet Take 37.5 mg by mouth daily.           Review of Systems  Constitutional: Negative for fever, chills, activity change, appetite change, fatigue and unexpected weight change.  HENT: Negative for hearing loss and neck pain.   Eyes: Positive for visual disturbance (regularly sees eye doctor).  Respiratory: Negative for cough, chest tightness, shortness of breath and wheezing.   Cardiovascular: Negative for chest pain, palpitations and leg swelling.  Gastrointestinal: Positive for nausea, vomiting (recent viral gastroenteritis) and abdominal pain. Negative for diarrhea,  constipation, blood in stool and abdominal distention.  Genitourinary: Negative for hematuria and difficulty urinating.  Musculoskeletal: Negative for myalgias and arthralgias.  Skin: Negative for rash.  Neurological: Negative for dizziness, seizures, syncope and headaches.  Hematological: Does not bruise/bleed easily.  Psychiatric/Behavioral: Negative for dysphoric mood. The patient is not nervous/anxious.        Objective:  Physical Exam  Nursing note and vitals reviewed. Constitutional: She is oriented to person, place, and time. She appears well-developed and well-nourished. No distress.  HENT:  Head: Normocephalic and atraumatic.  Right Ear: Hearing, tympanic membrane, external ear and ear canal normal.  Left Ear: Hearing, tympanic membrane, external ear and ear canal normal.  Nose: Nose normal.  Mouth/Throat: Oropharynx is clear and moist. No oropharyngeal exudate.  Eyes: Conjunctivae normal and EOM are normal. Pupils are equal, round, and reactive to light. No scleral icterus.  Neck: Normal range of motion. Neck supple. No thyromegaly present.  Cardiovascular: Normal heart sounds and intact distal pulses.  An irregularly irregular rhythm present. Tachycardia present.   No murmur heard. Pulses:      Radial pulses are 2+ on the right side, and 2+ on the left side.       Mild tachycardia, pt asxs  Pulmonary/Chest: Effort normal and breath sounds normal. No respiratory distress. She has no wheezes. She has no rales. Right breast exhibits no inverted nipple, no mass, no nipple discharge, no skin change and no tenderness. Left breast exhibits no inverted nipple, no mass, no nipple discharge, no skin change and no tenderness. Breasts are symmetrical.  Abdominal: Soft. Bowel sounds are normal. She exhibits no distension and no mass. There is no tenderness. There is no rebound and no guarding.  Musculoskeletal: Normal range of motion.  Lymphadenopathy:    She has no cervical adenopathy.     She has no axillary adenopathy.       Right axillary: No lateral adenopathy present.       Left axillary: No lateral adenopathy present.      Right: No supraclavicular adenopathy present.       Left: No supraclavicular adenopathy present.  Neurological: She is alert and oriented to person, place, and time.       CN grossly intact, station and gait intact  Skin: Skin is warm and dry. No rash noted.  Psychiatric: She has a normal mood and affect. Her behavior is normal. Judgment and thought content normal.       Assessment & Plan:

## 2012-05-08 ENCOUNTER — Ambulatory Visit: Payer: Medicare Other

## 2012-05-12 ENCOUNTER — Other Ambulatory Visit: Payer: Medicare Other

## 2012-05-12 DIAGNOSIS — Z1211 Encounter for screening for malignant neoplasm of colon: Secondary | ICD-10-CM

## 2012-05-12 LAB — FECAL OCCULT BLOOD, IMMUNOCHEMICAL: Fecal Occult Bld: NEGATIVE

## 2012-05-14 ENCOUNTER — Encounter: Payer: Self-pay | Admitting: Family Medicine

## 2012-05-14 ENCOUNTER — Encounter: Payer: Self-pay | Admitting: *Deleted

## 2012-05-25 ENCOUNTER — Other Ambulatory Visit: Payer: Self-pay | Admitting: Family Medicine

## 2012-07-04 ENCOUNTER — Other Ambulatory Visit: Payer: Self-pay | Admitting: Family Medicine

## 2012-07-05 MED ORDER — AMLODIPINE BESYLATE 5 MG PO TABS: 5.0000 mg | ORAL_TABLET | Freq: Every day | ORAL | Status: DC

## 2012-08-06 ENCOUNTER — Ambulatory Visit
Admission: RE | Admit: 2012-08-06 | Discharge: 2012-08-06 | Disposition: A | Discharge status: Home / Self Care | Payer: Medicare Other | Source: Ambulatory Visit | Attending: Family Medicine | Admitting: Family Medicine

## 2012-08-06 ENCOUNTER — Encounter: Payer: Self-pay | Admitting: Cardiovascular Disease

## 2012-08-06 ENCOUNTER — Ambulatory Visit (INDEPENDENT_AMBULATORY_CARE_PROVIDER_SITE_OTHER): Payer: Medicare Other | Admitting: Cardiovascular Disease

## 2012-08-06 VITALS — BP 128/92 | HR 108 | Ht 64.0 in | Wt 150.0 lb

## 2012-08-06 DIAGNOSIS — I10 Essential (primary) hypertension: Secondary | ICD-10-CM

## 2012-08-06 DIAGNOSIS — I4892 Unspecified atrial flutter: Secondary | ICD-10-CM

## 2012-08-06 DIAGNOSIS — R0789 Other chest pain: Secondary | ICD-10-CM

## 2012-08-06 DIAGNOSIS — Z1231 Encounter for screening mammogram for malignant neoplasm of breast: Secondary | ICD-10-CM

## 2012-08-06 MED ORDER — RIVAROXABAN 15 MG PO TABS: 15.0000 mg | ORAL_TABLET | Freq: Every day | ORAL | Status: DC

## 2012-08-06 MED ORDER — METOPROLOL TARTRATE 50 MG PO TABS: 50.0000 mg | ORAL_TABLET | Freq: Two times a day (BID) | ORAL | Status: DC

## 2012-08-06 NOTE — Patient Instructions (Addendum)
Stop Amlodipine Increase Metoprolol to 50 mg twice daily.  Start Xarelto but at a smaller dose of 15 mg once daily. Do not take Aspirin with this.  Try to minimize using Naprosyn (Aleve).  Follow up in 1 month.

## 2012-08-06 NOTE — Progress Notes (Signed)
Primary care physician: Dr. Sharen Hones.  HPI  This is a pleasant 79 year old female who is here today for a followup visit regarding paroxysmal atrial flutter.  She also had atypical chest pain. She underwent a nuclear stress test which showed no evidence of ischemia. Echocardiogram showed normal LV systolic function, mild to moderate aortic and mitral regurgitation, moderate tricuspid regurgitation with mild pulmonary hypertension. During the echocardiogram, she was noted to be tachycardic and likely in atrial flutter with a heart rate of 122 beats per minute. Anticoagulation with Xarelto 20 mg daily was initiated. She discontinued the medication 2 weeks ago after she had recurrent episodes of subconjunctival hemorrhage. She had this in the past without anticoagulation it happened more frequently while she was on Xarelto. She continues to be on metoprolol for rate control. She is noted to be in atrial flutter with a heart rate of 108 beats per minute on EKG. By the time I examined her she was back in normal sinus rhythm. She denies any chest pain or dyspnea.  Allergies  Allergen Reactions  . Penicillins     REACTION: RASH     Current Outpatient Prescriptions on File Prior to Visit  Medication Sig Dispense Refill  . acyclovir (ZOVIRAX) 400 MG tablet Take 400 mg by mouth daily.        Marland Kitchen atorvastatin (LIPITOR) 20 MG tablet Take 1 tablet (20 mg total) by mouth daily.  30 tablet  6  . calcium-vitamin D (OSCAL WITH D) 500-200 MG-UNIT per tablet Take 1 tablet by mouth daily.        . cholecalciferol (VITAMIN D) 1000 UNITS tablet Take 1,000 Units by mouth daily.       . Coenzyme Q10 (COQ10) 200 MG CAPS Take by mouth daily.        . cyanocobalamin ([employer] VITAMIN B12) 1000 MCG tablet Take 1,000 mcg by mouth daily.      . diphenhydrAMINE (BENADRYL) 25 MG tablet Take 25 mg by mouth daily.      Marland Kitchen gabapentin (NEURONTIN) 100 MG capsule Take 1 capsule (100 mg total) by mouth 2 (two) times daily.  90  capsule  3  . glucosamine-chondroitin 500-400 MG tablet Take 1 tablet by mouth daily.        . hydrochlorothiazide (MICROZIDE) 12.5 MG capsule TAKE ONE CAPSULE DAILY  30 capsule  11  . naproxen sodium (ANAPROX) 220 MG tablet Take 2 tablets before breakfast and supper as needed.       . pantoprazole (PROTONIX) 40 MG tablet Take 40 mg by mouth daily.      . prednisoLONE acetate (PRED FORTE) 1 % ophthalmic suspension As directed.       . venlafaxine (EFFEXOR) 37.5 MG tablet Take 37.5 mg by mouth daily.         No current facility-administered medications on file prior to visit.     Past Medical History  Diagnosis Date  . GERD (gastroesophageal reflux disease) 05/1999  . Hyperlipidemia 12/2003  . Hypertension 1990  . Osteoarthritis 08/21/1989  . Osteoporosis 11/1997  . DDD (degenerative disc disease), lumbosacral 12/06/1992  . History of shingles     ophthalmic, takes acyclovir daily  . Cancer     skin cancer  . Paroxysmal atrial flutter 03/16/2012    on xarelto     Past Surgical History  Procedure Laterality Date  . Abdominal hysterectomy  Age 89 - 6    S/P TAH  . Cervical discectomy  1992    Fusion (Dr.  Botero)  . Esophagogastroduodenoscopy  01/01/2002    with ulcer, bx. neg; + stricture gastric ulcer with hemorrhage  . Esophagogastroduodenoscopy  04/23/2004    Esophageal stricture -- dilated  . Abdominal ultrasound  04/23/2004    Gallstones  . Cataract extraction  05/2010    left eye  . Skin cancer excision  05/20/2011    BCC from nose  . US echocardiography  03/2012    in flutter, EF 60%, mild-mod aortic, mitral, tricuspid regurg, mildly dilated LA     Family History  Problem Relation Age of Onset  . Emphysema Mother   . Heart disease Father     MI  . Stroke Father     first at age 68.  . Stroke Sister   . Diabetes Sister   . Hypertension Brother   . Heart disease Brother     Heart Valve  . Stroke Brother   . Diabetes Brother   . Cancer Brother      esophageal  . Diabetes Brother   . Diabetes Brother      History   Social History  . Marital Status: Married    Spouse Name: N/A    Number of Children: 5  . Years of Education: N/A   Occupational History  . Retired - [employer]    Social History Main Topics  . Smoking status: Former Smoker -- 0.25 packs/day for 1 years  . Smokeless tobacco: Never Used     Comment: pt smokes occasionally "socially"  . Alcohol Use: 0.5 oz/week    1 drink(s) per week     Comment: wine occassionally  . Drug Use: No  . Sexually Active: No   Other Topics Concern  . Not on file   Social History Narrative   Lives with grandson who smokes, dog   From [place].  Widowed 01/1999; 1st marriage 25 years / 2nd marriage 13 years.   One son deceased 2023-01-22.   Activity: active with dog   Diet: good water, fruits/vegetables daily       PHYSICAL EXAM   BP 128/92  Pulse 108  Ht 5\' 4"  (1.626 m)  Wt 150 lb (68.04 kg)  BMI 25.73 kg/m2  Constitutional: She is oriented to person, place, and time. She appears well-developed and well-nourished. No distress.  HENT: No nasal discharge.  Head: Normocephalic and atraumatic.  Eyes: Pupils are equal and round. Right eye exhibits no discharge. Left eye exhibits no discharge.  Neck: Normal range of motion. Neck supple. No JVD present. No thyromegaly present.  Cardiovascular: Normal rate, regular rhythm, normal heart sounds. Exam reveals no gallop and no friction rub. No murmur heard.  Pulmonary/Chest: Effort normal and breath sounds normal. No stridor. No respiratory distress. She has no wheezes. She has no rales. She exhibits no tenderness.  Abdominal: Soft. Bowel sounds are normal. She exhibits no distension. There is no tenderness. There is no rebound and no guarding.  Musculoskeletal: Normal range of motion. She exhibits no edema and no tenderness.  Neurological: She is alert and oriented to person, place, and time. Coordination normal.  Skin: Skin is warm  and dry. No rash noted. She is not diaphoretic. No erythema. No pallor.  Psychiatric: She has a normal mood and affect. Her behavior is normal. Judgment and thought content normal.    EKG: Atrial flutter with variable AV block with a heart rate of 108 beats per minute. Nonspecific T wave changes.  ASSESSMENT AND PLAN

## 2012-08-06 NOTE — Assessment & Plan Note (Signed)
It seems that she goes in and out of atrial flutter with occasional tachycardia. I will go ahead and increase the dose of metoprolol to 50 mg twice daily. I discussed with her again the importance of anticoagulation in order to decrease her risk of thromboembolic complications especially stroke. I will resume Xarelto but at a smaller dose of 15 mg daily to see if she can tolerate this. I asked her not to take aspirin with this and to try to minimize using nonsteroidal anti-inflammatory medications. If she develops recurrent conjunctival hemorrhage on this , I will consider switching her to a different agent.

## 2012-08-06 NOTE — Assessment & Plan Note (Signed)
No further chest pain. Recent nuclear stress test was unremarkable.

## 2012-08-06 NOTE — Assessment & Plan Note (Signed)
Given that I am increasing the dose of metoprolol, I will go ahead and stop amlodipine.

## 2012-08-10 ENCOUNTER — Encounter: Payer: Self-pay | Admitting: Family Medicine

## 2012-08-10 ENCOUNTER — Encounter: Payer: Self-pay | Admitting: *Deleted

## 2012-09-03 ENCOUNTER — Encounter: Payer: Self-pay | Admitting: Family Medicine

## 2012-09-03 ENCOUNTER — Other Ambulatory Visit: Payer: Self-pay | Admitting: Family Medicine

## 2012-09-03 ENCOUNTER — Ambulatory Visit (INDEPENDENT_AMBULATORY_CARE_PROVIDER_SITE_OTHER): Payer: Medicare Other | Admitting: Family Medicine

## 2012-09-03 VITALS — BP 130/90 | HR 88 | Temp 97.8°F | Wt 149.5 lb

## 2012-09-03 DIAGNOSIS — G2581 Restless legs syndrome: Secondary | ICD-10-CM

## 2012-09-03 DIAGNOSIS — M79604 Pain in right leg: Secondary | ICD-10-CM

## 2012-09-03 DIAGNOSIS — M79609 Pain in unspecified limb: Secondary | ICD-10-CM

## 2012-09-03 LAB — BASIC METABOLIC PANEL
CO2: 27 mEq/L (ref 19–32)
Calcium: 9.6 mg/dL (ref 8.4–10.5)
Chloride: 103 mEq/L (ref 96–112)
Sodium: 139 mEq/L (ref 135–145)

## 2012-09-03 LAB — IBC PANEL
Iron: 75 ug/dL (ref 42–145)
Transferrin: 307.3 mg/dL (ref 212.0–360.0)

## 2012-09-03 LAB — FERRITIN: Ferritin: 20.5 ng/mL (ref 10.0–291.0)

## 2012-09-03 MED ORDER — FERROUS SULFATE 325 (65 FE) MG PO TABS: 325.0000 mg | ORAL_TABLET | Freq: Every day | ORAL | Status: DC

## 2012-09-03 MED ORDER — ROPINIROLE HCL 0.5 MG PO TABS: 1.0000 mg | ORAL_TABLET | Freq: Every day | ORAL | Status: DC

## 2012-09-03 NOTE — Patient Instructions (Addendum)
I do wonder if this is restless legs - blood work today to check potassium and iron levels. Start medicine called requip - take 1/2 tablet a night for 2 days, if tolerated increas to whole tablet a nightly for minimum of 5 days, if tolerated and no noted improvement, may increase to 2 tablets nightly. Continue gabapentin. May continue tylenol at night. good to see you today, call us with questions.

## 2012-09-03 NOTE — Progress Notes (Signed)
  Subjective:    Patient ID: [name], female    DOB: Nov 10, 1929, 79 y.o.   MRN: [id number]  HPI CC: leg issues  For last several years, noticing more trouble with her legs only at night. Wakes up every morning 2/2 legs. Describes paresthesias of lower legs as well as electrical shocks down posterior legs.  No discomfort in feet or thighs.  Makes her wake up at night to walk which relieves symptoms.  Does have some myalgias with this.  Does walk daily - about 2 miles.  No symptoms while walking. No significant lower back pain.  No weakness endorsed.  Mowed yard yesterday, able to tolerate this well. On gabapentin for presumed neuropathy  Uses tylenol nightly for this as well as benadryl 75mg  nightly. Prior on effexor for lower back pain, but had stopped for several weeks now.  Past Medical History  Diagnosis Date  . GERD (gastroesophageal reflux disease) 05/1999  . Hyperlipidemia 12/2003  . Hypertension 1990  . Osteoarthritis 08/21/1989  . Osteoporosis 11/1997  . DDD (degenerative disc disease), lumbosacral 12/06/1992  . History of shingles     ophthalmic, takes acyclovir daily  . Cancer     skin cancer  . Paroxysmal atrial flutter 03/16/2012    on xarelto     Review of Systems Per HPI    Objective:   Physical Exam  Nursing note and vitals reviewed. Constitutional: She appears well-developed and well-nourished. No distress.  Musculoskeletal: She exhibits no edema.  No calf pain, no palpable cords, no muscle pain down legs. No midline or paraspinous mm tenderness. Neg SLR. 2+ DP bilaterally  Neurological: She is alert. She has normal strength. No sensory deficit.  Skin: Skin is warm and dry. No rash noted.       Assessment & Plan:

## 2012-09-03 NOTE — Assessment & Plan Note (Signed)
Prior thought due to neuropathy but normal B12 and D levels. ?RLS at happens more at night.  Not consistent with claudication sxs. Will treat as RLS with trial of requip - discussed titration of med. rtc 1 mo for f/u. Check iron panel today and potassium as on HCTZ.

## 2012-09-07 ENCOUNTER — Ambulatory Visit (INDEPENDENT_AMBULATORY_CARE_PROVIDER_SITE_OTHER): Payer: Medicare Other | Admitting: Cardiovascular Disease

## 2012-09-07 ENCOUNTER — Encounter: Payer: Self-pay | Admitting: Cardiovascular Disease

## 2012-09-07 ENCOUNTER — Encounter: Payer: Self-pay | Admitting: *Deleted

## 2012-09-07 VITALS — BP 122/60 | HR 63 | Ht 64.0 in | Wt 150.8 lb

## 2012-09-07 DIAGNOSIS — I10 Essential (primary) hypertension: Secondary | ICD-10-CM

## 2012-09-07 DIAGNOSIS — I4892 Unspecified atrial flutter: Secondary | ICD-10-CM

## 2012-09-07 DIAGNOSIS — R0602 Shortness of breath: Secondary | ICD-10-CM

## 2012-09-07 NOTE — Progress Notes (Signed)
Primary care physician: Dr. Sharen Hones.  HPI  This is a pleasant 79 year old female who is here today for a followup visit regarding paroxysmal atrial flutter.  She also had atypical chest pain. She underwent a nuclear stress test which showed no evidence of ischemia. Echocardiogram showed normal LV systolic function, mild to moderate aortic and mitral regurgitation, moderate tricuspid regurgitation with mild pulmonary hypertension. During the echocardiogram, she was noted to be tachycardic and likely in atrial flutter with a heart rate of 122 beats per minute. Anticoagulation with Xarelto 20 mg daily was initiated. The dose was subsequently decreased to 15 mg once daily due to recurrent episodes of subconjunctival hemorrhage.  She was noted to be in atrial flutter with a heart rate of 108 beats during last visit. Thus, I increased metoprolol to 50 mg twice daily and stop amlodipine. She has been doing well since then with no recurrent palpitations.  Allergies  Allergen Reactions  . Penicillins     REACTION: RASH     Current Outpatient Prescriptions on File Prior to Visit  Medication Sig Dispense Refill  . acyclovir (ZOVIRAX) 400 MG tablet Take 400 mg by mouth daily.        Marland Kitchen atorvastatin (LIPITOR) 20 MG tablet Take 1 tablet (20 mg total) by mouth daily.  30 tablet  6  . calcium-vitamin D (OSCAL WITH D) 500-200 MG-UNIT per tablet Take 1 tablet by mouth daily.        . cholecalciferol (VITAMIN D) 1000 UNITS tablet Take 1,000 Units by mouth daily.       . Coenzyme Q10 (COQ10) 200 MG CAPS Take by mouth daily.        . cyanocobalamin ([facility]S VITAMIN B12) 1000 MCG tablet Take 1,000 mcg by mouth daily.      . diphenhydrAMINE (BENADRYL) 25 MG tablet Take 75 mg by mouth daily.       . ferrous sulfate 325 (65 FE) MG tablet Take 1 tablet (325 mg total) by mouth daily with breakfast.      . gabapentin (NEURONTIN) 100 MG capsule Take 1 capsule (100 mg total) by mouth 2 (two) times daily.  90 capsule  3   . glucosamine-chondroitin 500-400 MG tablet Take 1 tablet by mouth daily.       . hydrochlorothiazide (MICROZIDE) 12.5 MG capsule TAKE ONE CAPSULE DAILY  30 capsule  11  . metoprolol (LOPRESSOR) 50 MG tablet Take 1 tablet (50 mg total) by mouth 2 (two) times daily.  60 tablet  6  . naproxen sodium (ANAPROX) 220 MG tablet Take 2 tablets before breakfast and supper as needed.       . pantoprazole (PROTONIX) 40 MG tablet Take 40 mg by mouth daily.      . prednisoLONE acetate (PRED FORTE) 1 % ophthalmic suspension As directed.       . Rivaroxaban (XARELTO) 15 MG TABS tablet Take 1 tablet (15 mg total) by mouth daily.  30 tablet  6  . rOPINIRole (REQUIP) 0.5 MG tablet Take 2 tablets (1 mg total) by mouth at bedtime.  60 tablet  0  . venlafaxine (EFFEXOR) 37.5 MG tablet Take 37.5 mg by mouth daily.         No current facility-administered medications on file prior to visit.     Past Medical History  Diagnosis Date  . GERD (gastroesophageal reflux disease) 05/1999  . Hyperlipidemia 12/2003  . Hypertension 1990  . Osteoarthritis 08/21/1989  . Osteoporosis 11/1997  . DDD (degenerative disc disease),  lumbosacral 12/06/1992  . History of shingles     ophthalmic, takes acyclovir daily  . Cancer     skin cancer  . Paroxysmal atrial flutter 03/16/2012    on xarelto  . RLS (restless legs syndrome)      Past Surgical History  Procedure Laterality Date  . Abdominal hysterectomy  Age 52 - 49    S/P TAH  . Cervical discectomy  1992    Fusion (Dr. Jeral Fruit)  . Esophagogastroduodenoscopy  01/01/2002    with ulcer, bx. neg; + stricture gastric ulcer with hemorrhage  . Esophagogastroduodenoscopy  04/23/2004    Esophageal stricture -- dilated  . Abdominal ultrasound  04/23/2004    Gallstones  . Cataract extraction  05/2010    left eye  . Skin cancer excision  05/20/2011    BCC from nose  . US echocardiography  03/2012    in flutter, EF 60%, mild-mod aortic, mitral, tricuspid regurg, mildly  dilated LA  . Nuclear stress test  03/2012    no ischemia     Family History  Problem Relation Age of Onset  . Emphysema Mother   . Heart disease Father     MI  . Stroke Father     first at age 66.  . Stroke Sister   . Diabetes Sister   . Hypertension Brother   . Heart disease Brother     Heart Valve  . Stroke Brother   . Diabetes Brother   . Cancer Brother     esophageal  . Diabetes Brother   . Diabetes Brother      History   Social History  . Marital Status: Married    Spouse Name: N/A    Number of Children: 5  . Years of Education: N/A   Occupational History  . Retired - [employer]    Social History Main Topics  . Smoking status: Former Smoker -- 0.25 packs/day for 1 years  . Smokeless tobacco: Never Used     Comment: pt smokes occasionally "socially"  . Alcohol Use: 0.5 oz/week    1 drink(s) per week     Comment: wine occassionally  . Drug Use: No  . Sexually Active: No   Other Topics Concern  . Not on file   Social History Narrative   Lives with grandson who smokes, dog   From [place].  Widowed 01/1999; 1st marriage 25 years / 2nd marriage 13 years.   One son deceased 01-27-23.   Activity: active with dog   Diet: good water, fruits/vegetables daily       PHYSICAL EXAM   BP 122/60  Pulse 63  Ht 5\' 4"  (1.626 m)  Wt 150 lb 12 oz (68.38 kg)  BMI 25.86 kg/m2  Constitutional: She is oriented to person, place, and time. She appears well-developed and well-nourished. No distress.  HENT: No nasal discharge.  Head: Normocephalic and atraumatic.  Eyes: Pupils are equal and round. Right eye exhibits no discharge. Left eye exhibits no discharge.  Neck: Normal range of motion. Neck supple. No JVD present. No thyromegaly present.  Cardiovascular: Normal rate, regular rhythm, normal heart sounds. Exam reveals no gallop and no friction rub. No murmur heard.  Pulmonary/Chest: Effort normal and breath sounds normal. No stridor. No respiratory distress. She has  no wheezes. She has no rales. She exhibits no tenderness.  Abdominal: Soft. Bowel sounds are normal. She exhibits no distension. There is no tenderness. There is no rebound and no guarding.  Musculoskeletal: Normal range of motion.  She exhibits no edema and no tenderness.  Neurological: She is alert and oriented to person, place, and time. Coordination normal.  Skin: Skin is warm and dry. No rash noted. She is not diaphoretic. No erythema. No pallor.  Psychiatric: She has a normal mood and affect. Her behavior is normal. Judgment and thought content normal.    EKG: Normal sinus rhythm with a ventricular rate of 64 beats per minute. Nonspecific ST depression.  ASSESSMENT AND PLAN

## 2012-09-07 NOTE — Assessment & Plan Note (Signed)
Blood pressure is controlled on current medications off amlodipine.

## 2012-09-07 NOTE — Patient Instructions (Addendum)
Continue same medications.  Follow up in 6 months.  

## 2012-09-07 NOTE — Assessment & Plan Note (Signed)
She is currently in normal sinus rhythm with no recurrent palpitations. Continue current dose of metoprolol. Continue treatment with Xarelto 15 mg once daily. I will consider increasing the dose back to 20 mg once daily upon followup if she has no recurrent subconjunctival hemorrhages. She was on aspirin when that happened.

## 2012-10-05 ENCOUNTER — Other Ambulatory Visit: Payer: Self-pay | Admitting: Family Medicine

## 2012-10-05 NOTE — Telephone Encounter (Signed)
Ok to refill in Dr. Timoteo Expose absence? Last filled 09/03/12.

## 2012-10-06 NOTE — Telephone Encounter (Signed)
Sent!

## 2012-10-07 ENCOUNTER — Encounter: Payer: Self-pay | Admitting: Family Medicine

## 2012-10-07 ENCOUNTER — Ambulatory Visit (INDEPENDENT_AMBULATORY_CARE_PROVIDER_SITE_OTHER): Payer: Medicare Other | Admitting: Family Medicine

## 2012-10-07 VITALS — BP 126/84 | HR 80 | Temp 97.9°F | Wt 152.2 lb

## 2012-10-07 DIAGNOSIS — G629 Polyneuropathy, unspecified: Secondary | ICD-10-CM

## 2012-10-07 DIAGNOSIS — G589 Mononeuropathy, unspecified: Secondary | ICD-10-CM

## 2012-10-07 DIAGNOSIS — G2581 Restless legs syndrome: Secondary | ICD-10-CM

## 2012-10-07 MED ORDER — PANTOPRAZOLE SODIUM 40 MG PO TBEC: 40.0000 mg | DELAYED_RELEASE_TABLET | Freq: Every day | ORAL | Status: DC

## 2012-10-07 MED ORDER — ROPINIROLE HCL 1 MG PO TABS: ORAL_TABLET | ORAL | Status: DC

## 2012-10-07 MED ORDER — GABAPENTIN 100 MG PO CAPS: 100.0000 mg | ORAL_CAPSULE | Freq: Two times a day (BID) | ORAL | Status: DC

## 2012-10-07 MED ORDER — VENLAFAXINE HCL 37.5 MG PO TABS: 37.5000 mg | ORAL_TABLET | Freq: Every day | ORAL | Status: DC

## 2012-10-07 NOTE — Assessment & Plan Note (Signed)
Continue requip - discussed titration of med. May do trial off coQ10 - doubt really helping, has never had issues with lipitor in past. rtc if needed, update if worsening

## 2012-10-07 NOTE — Patient Instructions (Signed)
Let's do trial off coQ10 - this is for muscle function.  If worsening muscle aches off this medicine, let me know. Let's increase requip - to 1.5 tablets for 1-2 weeks then may increase to 2 tablets nightly. Good to see you today, call us with questions.

## 2012-10-07 NOTE — Progress Notes (Signed)
  Subjective:    Patient ID: [name], female    DOB: 1929/07/24, 79 y.o.   MRN: [id number]  HPI CC: f/u leg pains  See prior note for details.  Presents as 1 mo f/u of last visit. Last visit eval for bilateral nocturnal leg pain thought consistent with RLS - so she had trial of requip - notes significant improvement. Also had low normal ferritin and low %sat, but normal iron levels.  Started iron one pill daily. Continues gabapentin.  Leg pain described as twitching, burning, and aching, electrical shocking as well.  On coq10.  Has never had myalgias on lipitor.  Past Medical History  Diagnosis Date  . GERD (gastroesophageal reflux disease) 05/1999  . Hyperlipidemia 12/2003  . Hypertension 1990  . Osteoarthritis 08/21/1989  . Osteoporosis 11/1997  . DDD (degenerative disc disease), lumbosacral 12/06/1992  . History of shingles     ophthalmic, takes acyclovir daily  . Cancer     skin cancer  . Paroxysmal atrial flutter 03/16/2012    on xarelto  . RLS (restless legs syndrome)      Review of Systems Per HPI     Objective:   Physical Exam  Nursing note and vitals reviewed. Constitutional: She appears well-developed and well-nourished. No distress.  Musculoskeletal: She exhibits no edema.  No calf tenderness 2+ DP/PT bilaterally       Assessment & Plan:

## 2012-11-07 ENCOUNTER — Other Ambulatory Visit: Payer: Self-pay | Admitting: Family Medicine

## 2012-11-09 MED ORDER — ROPINIROLE HCL 1 MG PO TABS: ORAL_TABLET | ORAL | Status: DC

## 2012-11-09 NOTE — Telephone Encounter (Signed)
Ok to refill 

## 2013-01-06 ENCOUNTER — Other Ambulatory Visit: Payer: Self-pay | Admitting: *Deleted

## 2013-01-06 MED ORDER — ATORVASTATIN CALCIUM 20 MG PO TABS: 20.0000 mg | ORAL_TABLET | Freq: Every day | ORAL | Status: DC

## 2013-01-06 NOTE — Telephone Encounter (Signed)
Refilled Atorvastatin sent to [facility] pharmacy.

## 2013-03-19 ENCOUNTER — Ambulatory Visit (INDEPENDENT_AMBULATORY_CARE_PROVIDER_SITE_OTHER): Payer: Medicare Other | Admitting: Cardiovascular Disease

## 2013-03-19 ENCOUNTER — Encounter: Payer: Self-pay | Admitting: Cardiovascular Disease

## 2013-03-19 VITALS — BP 159/113 | HR 125 | Ht 64.0 in | Wt 155.5 lb

## 2013-03-19 DIAGNOSIS — R0602 Shortness of breath: Secondary | ICD-10-CM

## 2013-03-19 DIAGNOSIS — I4892 Unspecified atrial flutter: Secondary | ICD-10-CM

## 2013-03-19 DIAGNOSIS — I10 Essential (primary) hypertension: Secondary | ICD-10-CM

## 2013-03-19 MED ORDER — METOPROLOL TARTRATE 100 MG PO TABS: 100.0000 mg | ORAL_TABLET | Freq: Two times a day (BID) | ORAL | Status: DC

## 2013-03-19 MED ORDER — RIVAROXABAN 20 MG PO TABS: 20.0000 mg | ORAL_TABLET | Freq: Every day | ORAL | Status: DC

## 2013-03-19 NOTE — Progress Notes (Signed)
Primary care physician: Dr. Sharen Hones.  HPI  This is a pleasant 79 year old female who is here today for a followup visit regarding paroxysmal atrial flutter/fibrillation.  She also had atypical chest pain. Nuclear stress test in 03/2012 showed no evidence of ischemia. Echocardiogram showed normal LV systolic function, mild to moderate aortic and mitral regurgitation, moderate tricuspid regurgitation with mild pulmonary hypertension. During the echocardiogram, she was noted to be tachycardic and likely in atrial flutter with a heart rate of 122 beats per minute. Anticoagulation with Xarelto 20 mg daily was initiated. The dose was subsequently decreased to 15 mg once daily due to recurrent episodes of subconjunctival hemorrhage.  She was noted to be in atypical atrial flutter versus coarse atrial fibrillation in 08/2012 with a heart rate of 108 beats during last visit. Thus, I increased metoprolol to 50 mg twice daily and stop amlodipine. She continues to have mild episodes of palpitations with occasional dyspnea. No dizziness, syncope or presyncope.  Allergies  Allergen Reactions  . Penicillins     REACTION: RASH     Current Outpatient Prescriptions on File Prior to Visit  Medication Sig Dispense Refill  . acyclovir (ZOVIRAX) 400 MG tablet Take 400 mg by mouth daily.        Marland Kitchen atorvastatin (LIPITOR) 20 MG tablet Take 1 tablet (20 mg total) by mouth daily.  30 tablet  6  . calcium-vitamin D (OSCAL WITH D) 500-200 MG-UNIT per tablet Take 1 tablet by mouth daily.        . cholecalciferol (VITAMIN D) 1000 UNITS tablet Take 1,000 Units by mouth daily.       . cyanocobalamin (CVS VITAMIN B12) 1000 MCG tablet Take 1,000 mcg by mouth daily.      . diphenhydrAMINE (BENADRYL) 25 MG tablet Take 75 mg by mouth daily.       . ferrous sulfate 325 (65 FE) MG tablet Take 1 tablet (325 mg total) by mouth daily with breakfast.      . glucosamine-chondroitin 500-400 MG tablet Take 1 tablet by mouth daily.        . hydrochlorothiazide (MICROZIDE) 12.5 MG capsule TAKE ONE CAPSULE DAILY  30 capsule  11  . pantoprazole (PROTONIX) 40 MG tablet Take 1 tablet (40 mg total) by mouth daily.  90 tablet  3  . prednisoLONE acetate (PRED FORTE) 1 % ophthalmic suspension As directed.       Marland Kitchen rOPINIRole (REQUIP) 1 MG tablet Take one tablet (1mg ) by mouth at bedtime  90 tablet  3   No current facility-administered medications on file prior to visit.     Past Medical History  Diagnosis Date  . GERD (gastroesophageal reflux disease) 05/1999  . Hyperlipidemia 12/2003  . Hypertension 1990  . Osteoarthritis 08/21/1989  . Osteoporosis 11/1997  . DDD (degenerative disc disease), lumbosacral 12/06/1992  . History of shingles     ophthalmic, takes acyclovir daily  . Cancer     skin cancer  . Paroxysmal atrial flutter 03/16/2012    on xarelto  . RLS (restless legs syndrome)   . BCC (basal cell carcinoma of skin) 05/2011    on nose     Past Surgical History  Procedure Laterality Date  . Abdominal hysterectomy  Age 53 - 52    S/P TAH  . Cervical discectomy  1992    Fusion (Dr. Jeral Fruit)  . Esophagogastroduodenoscopy  01/01/2002    with ulcer, bx. neg; + stricture gastric ulcer with hemorrhage  . Esophagogastroduodenoscopy  04/23/2004  Esophageal stricture -- dilated  . Abdominal ultrasound  04/23/2004    Gallstones  . Cataract extraction  05/2010    left eye  . Skin cancer excision  05/20/2011    BCC from nose  . US echocardiography  03/2012    in flutter, EF 60%, mild-mod aortic, mitral, tricuspid regurg, mildly dilated LA  . Nuclear stress test  03/2012    no ischemia     Family History  Problem Relation Age of Onset  . Emphysema Mother   . Heart disease Father     MI  . Stroke Father     first at age 72.  . Stroke Sister   . Diabetes Sister   . Hypertension Brother   . Heart disease Brother     Heart Valve  . Stroke Brother   . Diabetes Brother   . Cancer Brother     esophageal    . Diabetes Brother   . Diabetes Brother      History   Social History  . Marital Status: Married    Spouse Name: N/A    Number of Children: 5  . Years of Education: N/A   Occupational History  . Retired - [employer]    Social History Main Topics  . Smoking status: Former Smoker -- 0.25 packs/day for 1 years  . Smokeless tobacco: Never Used     Comment: pt smokes occasionally "socially"  . Alcohol Use: 0.5 oz/week    1 drink(s) per week     Comment: wine occassionally  . Drug Use: No  . Sexual Activity: No   Other Topics Concern  . Not on file   Social History Narrative   Lives with grandson who smokes, dog   From [place].  Widowed 01/1999; 1st marriage 25 years / 2nd marriage 13 years.   One son deceased 02-07-2023.   Activity: active with dog   Diet: good water, fruits/vegetables daily       PHYSICAL EXAM   BP 159/113  Pulse 125  Ht 5\' 4"  (1.626 m)  Wt 155 lb 8 oz (70.534 kg)  BMI 26.68 kg/m2  Constitutional: She is oriented to person, place, and time. She appears well-developed and well-nourished. No distress.  HENT: No nasal discharge.  Head: Normocephalic and atraumatic.  Eyes: Pupils are equal and round. Right eye exhibits no discharge. Left eye exhibits no discharge.  Neck: Normal range of motion. Neck supple. No JVD present. No thyromegaly present.  Cardiovascular: Tachycardic, irregular rhythm, normal heart sounds. Exam reveals no gallop and no friction rub. No murmur heard.  Pulmonary/Chest: Effort normal and breath sounds normal. No stridor. No respiratory distress. She has no wheezes. She has no rales. She exhibits no tenderness.  Abdominal: Soft. Bowel sounds are normal. She exhibits no distension. There is no tenderness. There is no rebound and no guarding.  Musculoskeletal: Normal range of motion. She exhibits no edema and no tenderness.  Neurological: She is alert and oriented to person, place, and time. Coordination normal.  Skin: Skin is warm and  dry. No rash noted. She is not diaphoretic. No erythema. No pallor.  Psychiatric: She has a normal mood and affect. Her behavior is normal. Judgment and thought content normal.    EKG: Atrial fibrillation with rapid ventricular response. Heart rate is 112 beats per minute.  ASSESSMENT AND PLAN

## 2013-03-19 NOTE — Assessment & Plan Note (Signed)
She is currently in atrial fibrillation with rapid ventricular response. It appears that she has been having these episodes frequently although it is difficult to determine that given the lack of classic symptoms. I recommend increasing the dose of metoprolol to 100 mg twice daily. If she becomes bradycardic on this dose, we might need to consider an antiarrhythmic medication. I am increasing the dose of Xarelto back to 20 mg once daily. It was reduced in the past due to recurrent subconjunctival hemorrhages which have not been happening lately. Recent renal function was normal.

## 2013-03-19 NOTE — Assessment & Plan Note (Signed)
Blood pressure is elevated. Metoprolol was increased today.

## 2013-03-19 NOTE — Patient Instructions (Signed)
Increase metoprolol to 100 mg twice daily.  Increase Xarelto to 20 mg  Once daily.   Follow up in 1 month.

## 2013-03-25 ENCOUNTER — Other Ambulatory Visit: Payer: Self-pay | Admitting: *Deleted

## 2013-03-25 MED ORDER — HYDROCHLOROTHIAZIDE 12.5 MG PO CAPS: ORAL_CAPSULE | ORAL | Status: DC

## 2013-04-23 ENCOUNTER — Ambulatory Visit (INDEPENDENT_AMBULATORY_CARE_PROVIDER_SITE_OTHER): Payer: Medicare Other | Admitting: Cardiovascular Disease

## 2013-04-23 ENCOUNTER — Encounter: Payer: Self-pay | Admitting: Cardiovascular Disease

## 2013-04-23 VITALS — BP 140/82 | HR 112 | Ht 64.0 in | Wt 155.5 lb

## 2013-04-23 DIAGNOSIS — I4819 Other persistent atrial fibrillation: Secondary | ICD-10-CM

## 2013-04-23 DIAGNOSIS — I4891 Unspecified atrial fibrillation: Secondary | ICD-10-CM

## 2013-04-23 DIAGNOSIS — I10 Essential (primary) hypertension: Secondary | ICD-10-CM

## 2013-04-23 DIAGNOSIS — I4892 Unspecified atrial flutter: Secondary | ICD-10-CM

## 2013-04-23 MED ORDER — METOPROLOL TARTRATE 100 MG PO TABS: 50.0000 mg | ORAL_TABLET | Freq: Two times a day (BID) | ORAL | Status: DC

## 2013-04-23 MED ORDER — DILTIAZEM HCL ER COATED BEADS 120 MG PO CP24: 120.0000 mg | ORAL_CAPSULE | Freq: Every day | ORAL | Status: DC

## 2013-04-23 NOTE — Progress Notes (Signed)
Primary care physician: Dr. Danise Mina.  HPI  This is a pleasant 80 year old female who is here today for a followup visit regarding persistent atrial flutter/fibrillation.  She also had atypical chest pain. Nuclear stress test in 03/2012 showed no evidence of ischemia. Echocardiogram showed normal LV systolic function, mild to moderate aortic and mitral regurgitation, moderate tricuspid regurgitation with mild pulmonary hypertension. During the echocardiogram, she was noted to be tachycardic and likely in atrial flutter with a heart rate of 122 beats per minute. Anticoagulation with Xarelto was initiated.  She was noted to be in atypical atrial flutter versus coarse atrial fibrillation in 08/2012 with a heart rate of 108 beats during last visit. Thus, I increased metoprolol to 50 mg twice daily and stopped amlodipine. Most recently, the dose of metoprolol was increased to 100 mg twice daily due to continued A. fib with rapid ventricular response. She reports no significant improvement. If anything, she feels very fatigued and has no energy. She still denies chest pain or dyspnea.  Allergies  Allergen Reactions  . Penicillins     REACTION: RASH     Current Outpatient Prescriptions on File Prior to Visit  Medication Sig Dispense Refill  . acyclovir (ZOVIRAX) 400 MG tablet Take 400 mg by mouth daily.        Marland Kitchen atorvastatin (LIPITOR) 20 MG tablet Take 1 tablet (20 mg total) by mouth daily.  30 tablet  6  . calcium-vitamin D (OSCAL WITH D) 500-200 MG-UNIT per tablet Take 1 tablet by mouth daily.        . cholecalciferol (VITAMIN D) 1000 UNITS tablet Take 1,000 Units by mouth daily.       . cyanocobalamin (CVS VITAMIN B12) 1000 MCG tablet Take 1,000 mcg by mouth daily.      . diphenhydrAMINE (BENADRYL) 25 MG tablet Take 75 mg by mouth as needed.       . ferrous sulfate 325 (65 FE) MG tablet Take 1 tablet (325 mg total) by mouth daily with breakfast.      . glucosamine-chondroitin 500-400 MG  tablet Take 1 tablet by mouth daily.       . hydrochlorothiazide (MICROZIDE) 12.5 MG capsule TAKE ONE CAPSULE DAILY  30 capsule  3  . metoprolol (LOPRESSOR) 100 MG tablet Take 1 tablet (100 mg total) by mouth 2 (two) times daily.  60 tablet  6  . pantoprazole (PROTONIX) 40 MG tablet Take 1 tablet (40 mg total) by mouth daily.  90 tablet  3  . prednisoLONE acetate (PRED FORTE) 1 % ophthalmic suspension 1 drop daily. As directed.      . Rivaroxaban (XARELTO) 20 MG TABS tablet Take 1 tablet (20 mg total) by mouth daily with supper.  30 tablet  6  . rOPINIRole (REQUIP) 1 MG tablet Take one tablet (1mg ) by mouth at bedtime  90 tablet  3  . venlafaxine (EFFEXOR) 37.5 MG tablet Take 37.5 mg by mouth as needed.       No current facility-administered medications on file prior to visit.     Past Medical History  Diagnosis Date  . GERD (gastroesophageal reflux disease) 05/1999  . Hyperlipidemia 12/2003  . Hypertension 1990  . Osteoarthritis 08/21/1989  . Osteoporosis 11/1997  . DDD (degenerative disc disease), lumbosacral 12/06/1992  . History of shingles     ophthalmic, takes acyclovir daily  . Cancer     skin cancer  . Paroxysmal atrial flutter 03/16/2012    on xarelto  . RLS (restless legs  syndrome)   . BCC (basal cell carcinoma of skin) 05/2011    on nose     Past Surgical History  Procedure Laterality Date  . Abdominal hysterectomy  Age 56 - 25    S/P TAH  . Cervical discectomy  1992    Fusion (Dr. Joya Salm)  . Esophagogastroduodenoscopy  01/01/2002    with ulcer, bx. neg; + stricture gastric ulcer with hemorrhage  . Esophagogastroduodenoscopy  04/23/2004    Esophageal stricture -- dilated  . Abdominal ultrasound  04/23/2004    Gallstones  . Cataract extraction  05/2010    left eye  . Skin cancer excision  05/20/2011    BCC from nose  . US echocardiography  03/2012    in flutter, EF 60%, mild-mod aortic, mitral, tricuspid regurg, mildly dilated LA  . Nuclear stress test   03/2012    no ischemia     Family History  Problem Relation Age of Onset  . Emphysema Mother   . Heart disease Father     MI  . Stroke Father     first at age 34.  . Stroke Sister   . Diabetes Sister   . Hypertension Brother   . Heart disease Brother     Heart Valve  . Stroke Brother   . Diabetes Brother   . Cancer Brother     esophageal  . Diabetes Brother   . Diabetes Brother      History   Social History  . Marital Status: Married    Spouse Name: N/A    Number of Children: 32  . Years of Education: N/A   Occupational History  . Retired - [employer]    Social History Main Topics  . Smoking status: Former Smoker -- 0.25 packs/day for 1 years  . Smokeless tobacco: Never Used     Comment: pt smokes occasionally "socially"  . Alcohol Use: 0.5 oz/week    1 drink(s) per week     Comment: wine occassionally  . Drug Use: No  . Sexual Activity: No   Other Topics Concern  . Not on file   Social History Narrative   Lives with grandson who smokes, dog   From [place].  Widowed Jan 24, 1999; 1st marriage 25 years / 2nd marriage 13 years.   One son deceased Jan 24, 2023.   Activity: active with dog   Diet: good water, fruits/vegetables daily       PHYSICAL EXAM   BP 140/82  Pulse 112  Ht 5\' 4"  (1.626 m)  Wt 155 lb 8 oz (70.534 kg)  BMI 26.68 kg/m2  Constitutional: She is oriented to person, place, and time. She appears well-developed and well-nourished. No distress.  HENT: No nasal discharge.  Head: Normocephalic and atraumatic.  Eyes: Pupils are equal and round. Right eye exhibits no discharge. Left eye exhibits no discharge.  Neck: Normal range of motion. Neck supple. No JVD present. No thyromegaly present.  Cardiovascular: Tachycardic, irregular rhythm, normal heart sounds. Exam reveals no gallop and no friction rub. No murmur heard.  Pulmonary/Chest: Effort normal and breath sounds normal. No stridor. No respiratory distress. She has no wheezes. She has no rales.  She exhibits no tenderness.  Abdominal: Soft. Bowel sounds are normal. She exhibits no distension. There is no tenderness. There is no rebound and no guarding.  Musculoskeletal: Normal range of motion. She exhibits no edema and no tenderness.  Neurological: She is alert and oriented to person, place, and time. Coordination normal.  Skin: Skin is warm and  dry. No rash noted. She is not diaphoretic. No erythema. No pallor.  Psychiatric: She has a normal mood and affect. Her behavior is normal. Judgment and thought content normal.    EKG: Atrial fibrillation with rapid ventricular response. Heart rate is 112 beats per minute.  ASSESSMENT AND PLAN

## 2013-04-23 NOTE — Patient Instructions (Signed)
Your physician recommends that you schedule a follow-up appointment in: 1 month    Your physician has recommended you make the following change in your medication:  Decrease Metoprolol 50 mg twice a day  Start Diltiazem 120 mg daily

## 2013-04-24 ENCOUNTER — Encounter: Payer: Self-pay | Admitting: Cardiovascular Disease

## 2013-04-24 DIAGNOSIS — I4819 Other persistent atrial fibrillation: Secondary | ICD-10-CM | POA: Insufficient documentation

## 2013-04-24 NOTE — Assessment & Plan Note (Signed)
Ventricular rate is still not controlled in spite of maximum dose metoprolol. She is also feeling very fatigued on the current dose. I discussed with her different management options. Given her continued symptoms, we might need to pursue rhythm control strategy with an antiarrhythmic medication followed by cardioversion. I discussed this with her and she prefers not to go through this currently and wants to keep this as a last resort. Thus, I will make some adjustment in her medications. Decrease metoprolol back to 50 mg twice daily and add diltiazem extended release 120 mg once daily. I will reevaluate her in one month. If there is no significant improvement, I will likely proceed with treatment with amiodarone followed by cardioversion.

## 2013-04-24 NOTE — Assessment & Plan Note (Signed)
Blood pressure is well controlled on current medications. 

## 2013-05-12 ENCOUNTER — Other Ambulatory Visit: Payer: Self-pay | Admitting: Family Medicine

## 2013-05-17 ENCOUNTER — Telehealth: Payer: Self-pay

## 2013-05-17 NOTE — Telephone Encounter (Signed)
Informed patient that per Dr. Fletcher Anon her other best option is coumadin. Patient does not wish to switch to coumadin at this time. Coupons and assistance program information for Xarelto left at front desk. Patient aware. She stated she would pick up tomorrow.

## 2013-05-17 NOTE — Telephone Encounter (Signed)
Pt would like Xarelto samples or switch her to  something her insurance will pay for.Please call.

## 2013-05-24 ENCOUNTER — Ambulatory Visit (INDEPENDENT_AMBULATORY_CARE_PROVIDER_SITE_OTHER): Payer: Medicare Other | Admitting: Cardiovascular Disease

## 2013-05-24 ENCOUNTER — Encounter: Payer: Self-pay | Admitting: Cardiovascular Disease

## 2013-05-24 VITALS — BP 151/91 | HR 86 | Ht 64.0 in | Wt 158.2 lb

## 2013-05-24 DIAGNOSIS — I4891 Unspecified atrial fibrillation: Secondary | ICD-10-CM

## 2013-05-24 DIAGNOSIS — I4819 Other persistent atrial fibrillation: Secondary | ICD-10-CM

## 2013-05-24 DIAGNOSIS — I10 Essential (primary) hypertension: Secondary | ICD-10-CM

## 2013-05-24 NOTE — Progress Notes (Signed)
Primary care physician: Dr. Danise Mina.  HPI  This is a pleasant 80 year old female who is here today for a followup visit regarding persistent atrial flutter/fibrillation.  She also had atypical chest pain. Nuclear stress test in 03/2012 showed no evidence of ischemia. Echocardiogram showed normal LV systolic function, mild to moderate aortic and mitral regurgitation, moderate tricuspid regurgitation with mild pulmonary hypertension.  During last visit, she was noted to be tachycardic in spite of metoprolol 100 mg twice daily. She was also complaining of fatigue. Thus, I decreased metoprolol to 50 mg twice daily and added diltiazem extended release 120 mg once daily. She noted gradual improvement since then.   Allergies  Allergen Reactions  . Penicillins     REACTION: RASH     Current Outpatient Prescriptions on File Prior to Visit  Medication Sig Dispense Refill  . acyclovir (ZOVIRAX) 400 MG tablet Take 400 mg by mouth daily.        Marland Kitchen atorvastatin (LIPITOR) 20 MG tablet Take 1 tablet (20 mg total) by mouth daily.  30 tablet  6  . calcium-vitamin D (OSCAL WITH D) 500-200 MG-UNIT per tablet Take 1 tablet by mouth daily.        . cholecalciferol (VITAMIN D) 1000 UNITS tablet Take 1,000 Units by mouth daily.       . cyanocobalamin ([facility] VITAMIN B12) 1000 MCG tablet Take 1,000 mcg by mouth daily.      Marland Kitchen diltiazem (CARDIZEM CD) 120 MG 24 hr capsule Take 1 capsule (120 mg total) by mouth daily.  90 capsule  3  . diphenhydrAMINE (BENADRYL) 25 MG tablet Take 75 mg by mouth as needed.       . ferrous sulfate 325 (65 FE) MG tablet Take 1 tablet (325 mg total) by mouth daily with breakfast.      . glucosamine-chondroitin 500-400 MG tablet Take 1 tablet by mouth daily.       . hydrochlorothiazide (MICROZIDE) 12.5 MG capsule TAKE ONE CAPSULE DAILY  30 capsule  3  . metoprolol (LOPRESSOR) 100 MG tablet Take 0.5 tablets (50 mg total) by mouth 2 (two) times daily.  60 tablet  6  . pantoprazole  (PROTONIX) 40 MG tablet TAKE ONE TABLET BY MOUTH ONCE A DAY  30 tablet  6  . prednisoLONE acetate (PRED FORTE) 1 % ophthalmic suspension 1 drop daily. As directed.      Marland Kitchen rOPINIRole (REQUIP) 1 MG tablet Take one tablet (1mg ) by mouth at bedtime  90 tablet  3  . venlafaxine (EFFEXOR) 37.5 MG tablet Take 37.5 mg by mouth as needed.      . Rivaroxaban (XARELTO) 20 MG TABS tablet Take 1 tablet (20 mg total) by mouth daily with supper.  30 tablet  6   No current facility-administered medications on file prior to visit.     Past Medical History  Diagnosis Date  . GERD (gastroesophageal reflux disease) 05/1999  . Hyperlipidemia 12/2003  . Hypertension 1990  . Osteoarthritis 08/21/1989  . Osteoporosis 11/1997  . DDD (degenerative disc disease), lumbosacral 12/06/1992  . History of shingles     ophthalmic, takes acyclovir daily  . Cancer     skin cancer  . Paroxysmal atrial flutter 03/16/2012    on xarelto  . RLS (restless legs syndrome)   . BCC (basal cell carcinoma of skin) 05/2011    on nose     Past Surgical History  Procedure Laterality Date  . Abdominal hysterectomy  Age 23 - 35  S/P TAH  . Cervical discectomy  1992    Fusion (Dr. Joya Salm)  . Esophagogastroduodenoscopy  01/01/2002    with ulcer, bx. neg; + stricture gastric ulcer with hemorrhage  . Esophagogastroduodenoscopy  04/23/2004    Esophageal stricture -- dilated  . Abdominal ultrasound  04/23/2004    Gallstones  . Cataract extraction  05/2010    left eye  . Skin cancer excision  05/20/2011    BCC from nose  . US echocardiography  03/2012    in flutter, EF 60%, mild-mod aortic, mitral, tricuspid regurg, mildly dilated LA  . Nuclear stress test  03/2012    no ischemia     Family History  Problem Relation Age of Onset  . Emphysema Mother   . Heart disease Father     MI  . Stroke Father     first at age 21.  . Stroke Sister   . Diabetes Sister   . Hypertension Brother   . Heart disease Brother      Heart Valve  . Stroke Brother   . Diabetes Brother   . Cancer Brother     esophageal  . Diabetes Brother   . Diabetes Brother      History   Social History  . Marital Status: Married    Spouse Name: N/A    Number of Children: 37  . Years of Education: N/A   Occupational History  . Retired - [employer]    Social History Main Topics  . Smoking status: Former Smoker -- 0.25 packs/day for 1 years  . Smokeless tobacco: Never Used     Comment: pt smokes occasionally "socially"  . Alcohol Use: 0.5 oz/week    1 drink(s) per week     Comment: wine occassionally  . Drug Use: No  . Sexual Activity: No   Other Topics Concern  . Not on file   Social History Narrative   Lives with grandson who smokes, dog   From [place].  Widowed 01/1999; 1st marriage 25 years / 2nd marriage 13 years.   One son deceased 2023/02/13.   Activity: active with dog   Diet: good water, fruits/vegetables daily       PHYSICAL EXAM   BP 151/91  Pulse 86  Ht 5\' 4"  (1.626 m)  Wt 158 lb 4 oz (71.782 kg)  BMI 27.15 kg/m2  Constitutional: She is oriented to person, place, and time. She appears well-developed and well-nourished. No distress.  HENT: No nasal discharge.  Head: Normocephalic and atraumatic.  Eyes: Pupils are equal and round. Right eye exhibits no discharge. Left eye exhibits no discharge.  Neck: Normal range of motion. Neck supple. No JVD present. No thyromegaly present.  Cardiovascular: Regular rate, irregular rhythm, normal heart sounds. Exam reveals no gallop and no friction rub. No murmur heard.  Pulmonary/Chest: Effort normal and breath sounds normal. No stridor. No respiratory distress. She has no wheezes. She has no rales. She exhibits no tenderness.  Abdominal: Soft. Bowel sounds are normal. She exhibits no distension. There is no tenderness. There is no rebound and no guarding.  Musculoskeletal: Normal range of motion. She exhibits no edema and no tenderness.  Neurological: She is alert  and oriented to person, place, and time. Coordination normal.  Skin: Skin is warm and dry. No rash noted. She is not diaphoretic. No erythema. No pallor.  Psychiatric: She has a normal mood and affect. Her behavior is normal. Judgment and thought content normal.    EKG: Atrial fibrillation with a  heart rate of 86 beats per minute ASSESSMENT AND PLAN

## 2013-05-24 NOTE — Assessment & Plan Note (Addendum)
Ventricular rate is more controlled after the addition of diltiazem. She is also feeling better. Continue current medications. She is on long-term anticoagulation with Xarelto. However, this was not approved by her insurance. I will try to obtain prior authorization. If none of the NOACs are approved, she might need to consider warfarin. This was discussed with her today. She was provided with samples today as she ran out. I explained to her the risk of stroke without anticoagulation.

## 2013-05-24 NOTE — Patient Instructions (Signed)
Resume Xarelto 20 mg once daily.  Continue other medications.   If Xarelto does not get approved, call us so that we start something else.   Follow up in 3 months.

## 2013-05-24 NOTE — Assessment & Plan Note (Signed)
Blood pressure is mildly elevated. I will consider increasing the dose of diltiazem.

## 2013-05-27 ENCOUNTER — Telehealth: Payer: Self-pay | Admitting: *Deleted

## 2013-05-27 NOTE — Telephone Encounter (Signed)
Patient requires prior authorization for Xarelto 20 mg 1 tablet daily. Form has been filled out and faxed to [facility] [phone].

## 2013-05-27 NOTE — Telephone Encounter (Signed)
Prior Auth for Xarelto faxed [facility] 2/19

## 2013-05-27 NOTE — Telephone Encounter (Signed)
Patient has been approved for Xarelto 20 mg 1 tablet daily 05/27/2013-05/27/2014.

## 2013-05-28 ENCOUNTER — Other Ambulatory Visit: Payer: Self-pay | Admitting: *Deleted

## 2013-05-28 MED ORDER — RIVAROXABAN 20 MG PO TABS: 20.0000 mg | ORAL_TABLET | Freq: Every day | ORAL | Status: DC

## 2013-05-28 NOTE — Telephone Encounter (Signed)
Requested Prescriptions   Signed Prescriptions Disp Refills  . Rivaroxaban (XARELTO) 20 MG TABS tablet 90 tablet 3    Sig: Take 1 tablet (20 mg total) by mouth daily with supper.    Authorizing Provider: [name] A    Ordering User: Britt Bottom

## 2013-06-29 ENCOUNTER — Encounter: Payer: Self-pay | Admitting: Family Medicine

## 2013-06-29 ENCOUNTER — Ambulatory Visit (INDEPENDENT_AMBULATORY_CARE_PROVIDER_SITE_OTHER): Payer: Medicare Other | Admitting: Family Medicine

## 2013-06-29 VITALS — BP 140/72 | HR 98 | Temp 97.9°F | Wt 158.5 lb

## 2013-06-29 DIAGNOSIS — M79605 Pain in left leg: Principal | ICD-10-CM

## 2013-06-29 DIAGNOSIS — M79604 Pain in right leg: Secondary | ICD-10-CM

## 2013-06-29 DIAGNOSIS — M79609 Pain in unspecified limb: Secondary | ICD-10-CM

## 2013-06-29 MED ORDER — TRAZODONE HCL 50 MG PO TABS: 25.0000 mg | ORAL_TABLET | Freq: Every evening | ORAL | Status: DC | PRN

## 2013-06-29 NOTE — Progress Notes (Signed)
Pre visit review using our clinic review tool, if applicable. No additional management support is needed unless otherwise documented below in the visit note. 

## 2013-06-29 NOTE — Progress Notes (Signed)
BP 140/72  Pulse 98  Temp(Src) 97.9 F (36.6 C) (Oral)  Wt 158 lb 8 oz (71.895 kg)  SpO2 96%   CC: "My legs are giving me a fit" Subjective:    Patient ID: [name], female    DOB: [birth year], 80 y.o.   MRN: [id number]  HPI: [name] is a 80 y.o. female presenting on 06/29/2013 for restless leg syndrome   See prior notes for details. For last several years, noticing more trouble with her legs only at night. Wakes up every morning 2/2 legs. No discomfort in feet or thighs. Makes her wake up at night to walk which relieves symptoms. Does have some myalgias with this.  describes tingling electrical sensation in bilateral legs, worst at right calf, "grabbing" pain.  R>L.  No numbness, no leg swelling or redness, no itching, no foot pain or knee/ankle pain.  Initially thought consistent with RLS and started on requip - initially helped, but has lost effectiveness.  She self increased to 3 mg nightly.  Progressively worsening.  No change between restful day and day full of exercise.  Had low normal ferritin and low %sat, but normal iron levels. Started iron one pill daily.  Continues gabapentin.  On coq10. Has never had myalgias on lipitor. We did taper off coq10  Does walk daily - about 2 miles. No symptoms while walking.  No significant lower back pain. No weakness endorsed.    Relevant past medical, surgical, family and social history reviewed and updated as indicated.  Allergies and medications reviewed and updated. Current Outpatient Prescriptions on File Prior to Visit  Medication Sig  . acyclovir (ZOVIRAX) 400 MG tablet Take 400 mg by mouth daily.    Marland Kitchen atorvastatin (LIPITOR) 20 MG tablet Take 1 tablet (20 mg total) by mouth daily.  . calcium-vitamin D (OSCAL WITH D) 500-200 MG-UNIT per tablet Take 1 tablet by mouth daily.    . cholecalciferol (VITAMIN D) 1000 UNITS tablet Take 1,000 Units by mouth daily.   . cyanocobalamin ([facility] VITAMIN B12) 1000 MCG tablet Take  1,000 mcg by mouth daily.  Marland Kitchen diltiazem (CARDIZEM CD) 120 MG 24 hr capsule Take 1 capsule (120 mg total) by mouth daily.  . diphenhydrAMINE (BENADRYL) 25 MG tablet Take 75 mg by mouth as needed.   . ferrous sulfate 325 (65 FE) MG tablet Take 1 tablet (325 mg total) by mouth daily with breakfast.  . glucosamine-chondroitin 500-400 MG tablet Take 1 tablet by mouth daily.   . hydrochlorothiazide (MICROZIDE) 12.5 MG capsule TAKE ONE CAPSULE DAILY  . metoprolol (LOPRESSOR) 100 MG tablet Take 0.5 tablets (50 mg total) by mouth 2 (two) times daily.  . pantoprazole (PROTONIX) 40 MG tablet TAKE ONE TABLET BY MOUTH ONCE A DAY  . prednisoLONE acetate (PRED FORTE) 1 % ophthalmic suspension 1 drop daily. As directed.  . Rivaroxaban (XARELTO) 20 MG TABS tablet Take 1 tablet (20 mg total) by mouth daily with supper.  . venlafaxine (EFFEXOR) 37.5 MG tablet Take 37.5 mg by mouth as needed.   No current facility-administered medications on file prior to visit.    Review of Systems Per HPI unless specifically indicated above    Objective:    BP 140/72  Pulse 98  Temp(Src) 97.9 F (36.6 C) (Oral)  Wt 158 lb 8 oz (71.895 kg)  SpO2 96%  Physical Exam  Nursing note and vitals reviewed. Constitutional: She appears well-developed and well-nourished. No distress.  Musculoskeletal: Normal range of motion. She exhibits  no edema and no tenderness.  No erythema or tenderness to palpation bilateral lower legs FROM at ankles and knees No popliteal fullness or palpable cords No edema 2+ DP bilaterally  Neurological: She has normal strength. No sensory deficit.  Reflex Scores:      Patellar reflexes are 2+ on the right side and 2+ on the left side. 5/5 strength BLE   Results for orders placed in visit on 03/50/09  BASIC METABOLIC PANEL      Result Value Ref Range   Sodium 139  135 - 145 mEq/L   Potassium 3.5  3.5 - 5.1 mEq/L   Chloride 103  96 - 112 mEq/L   CO2 27  19 - 32 mEq/L   Glucose, Bld 112 (*) 70  - 99 mg/dL   BUN 12  6 - 23 mg/dL   Creatinine, Ser 0.9  0.4 - 1.2 mg/dL   Calcium 9.6  8.4 - 10.5 mg/dL   GFR 63.83  >60.00 mL/min  FERRITIN      Result Value Ref Range   Ferritin 20.5  10.0 - 291.0 ng/mL  IBC PANEL      Result Value Ref Range   Iron 75  42 - 145 ug/dL   Transferrin 307.3  212.0 - 360.0 mg/dL   Saturation Ratios 17.4 (*) 20.0 - 50.0 %      Assessment & Plan:   Problem List Items Addressed This Visit   Leg pain, bilateral - Primary     Prior workup unrevealing (TSH, CK, B12, K). Did not respond to gabapentin or coQ10. Check blood work today - TSH, BMP, and CK. Anticipate statin related leg pains - will do trial off lipitor - if improved, consider lower potency statin for h/o HLD. Pt agrees with plan, will do 2-3 wk trial off lipitor and call me with effect. For insomnia related to leg pains - will do trial of trazodone (1/2 tab of 50mg  nightly for next few days).    Relevant Orders      Basic metabolic panel      CK       Follow up plan: Return if symptoms worsen or fail to improve.

## 2013-06-29 NOTE — Patient Instructions (Signed)
Let's do trial off lipitor - for next 2-3 weeks then call me with an update. If it is lipitor, we will do a lower strength cholesterol medicine. Blood work today to check muscle function and potassium. Good to see you today, call us with quesitons. Try trazodone for sleep - 25mg  at bedtime.

## 2013-06-29 NOTE — Assessment & Plan Note (Addendum)
Prior workup unrevealing (TSH, CK, B12, K). Did not respond to gabapentin or coQ10. Check blood work today - TSH, BMP, and CK. Anticipate statin related leg pains - will do trial off lipitor - if improved, consider lower potency statin for h/o HLD. Pt agrees with plan, will do 2-3 wk trial off lipitor and call me with effect. For insomnia related to leg pains - will do trial of trazodone (1/2 tab of 50mg  nightly for next few days).

## 2013-06-30 ENCOUNTER — Ambulatory Visit: Payer: Medicare Other

## 2013-06-30 ENCOUNTER — Other Ambulatory Visit: Payer: Self-pay | Admitting: Family Medicine

## 2013-06-30 DIAGNOSIS — R946 Abnormal results of thyroid function studies: Secondary | ICD-10-CM

## 2013-06-30 LAB — BASIC METABOLIC PANEL
BUN: 15 mg/dL (ref 6–23)
CHLORIDE: 104 meq/L (ref 96–112)
CO2: 26 meq/L (ref 19–32)
CREATININE: 1 mg/dL (ref 0.4–1.2)
Calcium: 9.5 mg/dL (ref 8.4–10.5)
GFR: 59.13 mL/min — ABNORMAL LOW (ref >60.00)
Glucose, Bld: 115 mg/dL — ABNORMAL HIGH (ref 70–99)
Potassium: 3.5 mEq/L (ref 3.5–5.1)
SODIUM: 139 meq/L (ref 135–145)

## 2013-06-30 LAB — TSH: TSH: 2.72 u[IU]/mL (ref 0.35–5.50)

## 2013-06-30 LAB — CK: CK TOTAL: 229 U/L — AB (ref 7–177)

## 2013-06-30 MED ORDER — TRAZODONE HCL 50 MG PO TABS: 25.0000 mg | ORAL_TABLET | Freq: Every evening | ORAL | Status: DC | PRN

## 2013-06-30 NOTE — Telephone Encounter (Signed)
I'm sorry I sent trazodone [facility] instead of local - plz check with patient where she wants trazodone sent in and send electronically #[phone].

## 2013-06-30 NOTE — Telephone Encounter (Signed)
Called and discussed below with patient

## 2013-06-30 NOTE — Telephone Encounter (Signed)
Pt seen Dr. Darnell Level yesterday and he told her to go off her lipitor and take trazodome 25 mg but he did not give her an rx for it ??? Pt is confused about what she needs to take and do ?? Please advise.

## 2013-07-09 ENCOUNTER — Other Ambulatory Visit: Payer: Self-pay | Admitting: Family Medicine

## 2013-07-23 ENCOUNTER — Encounter: Payer: Self-pay | Admitting: Internal Medicine

## 2013-07-23 ENCOUNTER — Ambulatory Visit (INDEPENDENT_AMBULATORY_CARE_PROVIDER_SITE_OTHER): Payer: Medicare Other | Admitting: Internal Medicine

## 2013-07-23 VITALS — BP 118/64 | HR 58 | Temp 97.9°F | Wt 152.5 lb

## 2013-07-23 DIAGNOSIS — J309 Allergic rhinitis, unspecified: Secondary | ICD-10-CM

## 2013-07-23 NOTE — Patient Instructions (Signed)
Allergic Rhinitis Allergic rhinitis is when the mucous membranes in the nose respond to allergens. Allergens are particles in the air that cause your body to have an allergic reaction. This causes you to release allergic antibodies. Through a chain of events, these eventually cause you to release histamine into the blood stream. Although meant to protect the body, it is this release of histamine that causes your discomfort, such as frequent sneezing, congestion, and an itchy, runny nose.  CAUSES  Seasonal allergic rhinitis (hay fever) is caused by pollen allergens that may come from grasses, trees, and weeds. Year-round allergic rhinitis (perennial allergic rhinitis) is caused by allergens such as house dust mites, pet dander, and mold spores.  SYMPTOMS   Nasal stuffiness (congestion).  Itchy, runny nose with sneezing and tearing of the eyes. DIAGNOSIS  Your health care provider can help you determine the allergen or allergens that trigger your symptoms. If you and your health care provider are unable to determine the allergen, skin or blood testing may be used. TREATMENT  Allergic Rhinitis does not have a cure, but it can be controlled by:  Medicines and allergy shots (immunotherapy).  Avoiding the allergen. Hay fever may often be treated with antihistamines in pill or nasal spray forms. Antihistamines block the effects of histamine. There are over-the-counter medicines that may help with nasal congestion and swelling around the eyes. Check with your health care provider before taking or giving this medicine.  If avoiding the allergen or the medicine prescribed do not work, there are many new medicines your health care provider can prescribe. Stronger medicine may be used if initial measures are ineffective. Desensitizing injections can be used if medicine and avoidance does not work. Desensitization is when a patient is given ongoing shots until the body becomes less sensitive to the allergen.  Make sure you follow up with your health care provider if problems continue. HOME CARE INSTRUCTIONS It is not possible to completely avoid allergens, but you can reduce your symptoms by taking steps to limit your exposure to them. It helps to know exactly what you are allergic to so that you can avoid your specific triggers. SEEK MEDICAL CARE IF:   You have a fever.  You develop a cough that does not stop easily (persistent).  You have shortness of breath.  You start wheezing.  Symptoms interfere with normal daily activities. Document Released: 12/18/2000 Document Revised: 01/13/2013 Document Reviewed: 11/30/2012 [employer] Patient Information 2014 [employer].  

## 2013-07-23 NOTE — Progress Notes (Signed)
Pre visit review using our clinic review tool, if applicable. No additional management support is needed unless otherwise documented below in the visit note. 

## 2013-07-23 NOTE — Progress Notes (Signed)
HPI  Pt presents to the clinic today with c/o cough and fatigue. She reports this started 3 days ago. The cough is dry and non productive. She denies fever, chills or body aches. She has taken Benadryl OTC. She has no history of allergies or breathing problems.  Review of Systems      Past Medical History  Diagnosis Date  . GERD (gastroesophageal reflux disease) 05/1999  . Hyperlipidemia 12/2003  . Hypertension 1990  . Osteoarthritis 08/21/1989  . Osteoporosis 11/1997  . DDD (degenerative disc disease), lumbosacral 12/06/1992  . History of shingles     ophthalmic, takes acyclovir daily  . Cancer     skin cancer  . Paroxysmal atrial flutter 03/16/2012    on xarelto  . RLS (restless legs syndrome)   . BCC (basal cell carcinoma of skin) 05/2011    on nose    Family History  Problem Relation Age of Onset  . Emphysema Mother   . Heart disease Father     MI  . Stroke Father     first at age 35.  . Stroke Sister   . Diabetes Sister   . Hypertension Brother   . Heart disease Brother     Heart Valve  . Stroke Brother   . Diabetes Brother   . Cancer Brother     esophageal  . Diabetes Brother   . Diabetes Brother     History   Social History  . Marital Status: Married    Spouse Name: N/A    Number of Children: 79  . Years of Education: N/A   Occupational History  . Retired - [employer]    Social History Main Topics  . Smoking status: Former Smoker -- 0.25 packs/day for 1 years  . Smokeless tobacco: Never Used     Comment: pt smokes occasionally "socially"  . Alcohol Use: 0.5 oz/week    1 drink(s) per week     Comment: wine occassionally  . Drug Use: No  . Sexual Activity: No   Other Topics Concern  . Not on file   Social History Narrative   Lives with grandson who smokes, dog   From [place].  Widowed 01/1999; 1st marriage 25 years / 2nd marriage 13 years.   One son deceased 02/01/23.   Activity: active with dog   Diet: good water, fruits/vegetables daily     Allergies  Allergen Reactions  . Penicillins     REACTION: RASH     Constitutional: Positive headache, fatigue. Denies fever or abrupt weight changes.  HEENT:  Positive sore throat. Denies eye redness, eye pain, pressure behind the eyes, facial pain, nasal congestion, ear pain, ringing in the ears, wax buildup, runny nose or bloody nose. Respiratory: Positive cough. Denies difficulty breathing or shortness of breath.  Cardiovascular: Denies chest pain, chest tightness, palpitations or swelling in the hands or feet.   No other specific complaints in a complete review of systems (except as listed in HPI above).  Objective:   BP 118/64  Pulse 58  Temp(Src) 97.9 F (36.6 C) (Oral)  Wt 152 lb 8 oz (69.174 kg)  SpO2 98% Wt Readings from Last 3 Encounters:  07/23/13 152 lb 8 oz (69.174 kg)  06/29/13 158 lb 8 oz (71.895 kg)  05/24/13 158 lb 4 oz (71.782 kg)     General: Appears her stated age, well developed, well nourished in NAD. HEENT: Head: normal shape and size; Eyes: sclera white, no icterus, conjunctiva pink, PERRLA and EOMs intact; Ears:  Tm's gray and intact, normal light reflex; Nose: mucosa pink and moist, septum midline; Throat/Mouth: + PND. Teeth present, mucosa erythematous and moist, no exudate noted, no lesions or ulcerations noted.  Neck:  Neck supple, trachea midline. No massses, lumps or thyromegaly present.  Cardiovascular: irregular rate and rhythm. No murmur, rubs or gallops noted. No JVD or BLE edema. No carotid bruits noted. Pulmonary/Chest: Normal effort and positive vesicular breath sounds. No respiratory distress. No wheezes, rales or ronchi noted.      Assessment & Plan:   Allergic rhinitis:  Get some rest and drink plenty of water Do salt water gargles for the sore throat Take Claritin OTC BID x 1 week then daily therafter  RTC as needed or if symptoms persist.

## 2013-08-11 ENCOUNTER — Other Ambulatory Visit: Payer: Self-pay | Admitting: Family Medicine

## 2013-08-11 NOTE — Telephone Encounter (Signed)
Ok to refill 

## 2013-08-19 ENCOUNTER — Telehealth: Payer: Self-pay

## 2013-08-19 NOTE — Telephone Encounter (Signed)
Placed samples up front for pick up. 

## 2013-08-19 NOTE — Telephone Encounter (Signed)
Pt needs Xarelto samples. Pt has appt on Monday. Please call

## 2013-08-23 ENCOUNTER — Encounter: Payer: Self-pay | Admitting: Cardiovascular Disease

## 2013-08-23 ENCOUNTER — Telehealth: Payer: Self-pay | Admitting: *Deleted

## 2013-08-23 ENCOUNTER — Ambulatory Visit (INDEPENDENT_AMBULATORY_CARE_PROVIDER_SITE_OTHER): Payer: Medicare Other | Admitting: Cardiovascular Disease

## 2013-08-23 VITALS — BP 158/98 | HR 93 | Ht 64.0 in | Wt 152.2 lb

## 2013-08-23 DIAGNOSIS — Z7689 Persons encountering health services in other specified circumstances: Secondary | ICD-10-CM | POA: Insufficient documentation

## 2013-08-23 DIAGNOSIS — I4819 Other persistent atrial fibrillation: Secondary | ICD-10-CM

## 2013-08-23 DIAGNOSIS — R079 Chest pain, unspecified: Secondary | ICD-10-CM

## 2013-08-23 DIAGNOSIS — I4891 Unspecified atrial fibrillation: Secondary | ICD-10-CM

## 2013-08-23 DIAGNOSIS — I10 Essential (primary) hypertension: Secondary | ICD-10-CM

## 2013-08-23 DIAGNOSIS — Z0189 Encounter for other specified special examinations: Secondary | ICD-10-CM

## 2013-08-23 DIAGNOSIS — J209 Acute bronchitis, unspecified: Secondary | ICD-10-CM

## 2013-08-23 MED ORDER — WARFARIN SODIUM 5 MG PO TABS: 5.0000 mg | ORAL_TABLET | Freq: Every day | ORAL | Status: DC

## 2013-08-23 MED ORDER — DILTIAZEM HCL ER COATED BEADS 240 MG PO CP24: 240.0000 mg | ORAL_CAPSULE | Freq: Every day | ORAL | Status: DC

## 2013-08-23 MED ORDER — LEVOFLOXACIN 500 MG PO TABS: 500.0000 mg | ORAL_TABLET | Freq: Every day | ORAL | Status: DC

## 2013-08-23 NOTE — Telephone Encounter (Signed)
Called patient to instruct her that per Dr. Fletcher Anon "Do not start coumadin until after she is done with 5 days of Levaquin, change coumadin clinic appt to next Wednesday 5/27"  No answer. Left VM 5/18.

## 2013-08-23 NOTE — Assessment & Plan Note (Signed)
She has been having productive cough with no improvement with Claritin. Lungs are relatively clear so I doubt pneumonia. However, given persistent symptoms, I prescribed Levaquin 500 milligram once daily.

## 2013-08-23 NOTE — Progress Notes (Signed)
Primary care physician: Dr. Danise Mina.  HPI  This is a pleasant 80 year old female who is here today for a followup visit regarding persistent atrial flutter/fibrillation.  She also had atypical chest pain. Nuclear stress test in 03/2012 showed no evidence of ischemia. Echocardiogram showed normal LV systolic function, mild to moderate aortic and mitral regurgitation, moderate tricuspid regurgitation with mild pulmonary hypertension.  She's been having cough for the last 3 weeks. This has been recently productive of grayish sputum. She took Claritin with no improvement. She ran out of Xarelto 2 weeks ago and is not able to afford it. Ventricular rate is still on the high side.   Allergies  Allergen Reactions  . Penicillins     REACTION: RASH     Current Outpatient Prescriptions on File Prior to Visit  Medication Sig Dispense Refill  . acyclovir (ZOVIRAX) 400 MG tablet Take 400 mg by mouth daily.        Marland Kitchen atorvastatin (LIPITOR) 20 MG tablet Take 1 tablet (20 mg total) by mouth daily.  30 tablet  6  . calcium-vitamin D (OSCAL WITH D) 500-200 MG-UNIT per tablet Take 1 tablet by mouth daily.        . cholecalciferol (VITAMIN D) 1000 UNITS tablet Take 1,000 Units by mouth daily.       . cyanocobalamin ([employer] VITAMIN B12) 1000 MCG tablet Take 1,000 mcg by mouth daily.      Marland Kitchen diltiazem (CARDIZEM CD) 120 MG 24 hr capsule Take 1 capsule (120 mg total) by mouth daily.  90 capsule  3  . ferrous sulfate 325 (65 FE) MG tablet Take 1 tablet (325 mg total) by mouth daily with breakfast.      . glucosamine-chondroitin 500-400 MG tablet Take 1 tablet by mouth daily.       . hydrochlorothiazide (MICROZIDE) 12.5 MG capsule TAKE 1 CAPSULE BY MOUTH DAILY  30 capsule  11  . metoprolol (LOPRESSOR) 100 MG tablet Take 0.5 tablets (50 mg total) by mouth 2 (two) times daily.  60 tablet  6  . pantoprazole (PROTONIX) 40 MG tablet TAKE ONE TABLET BY MOUTH ONCE A DAY  30 tablet  6  . prednisoLONE acetate (PRED  FORTE) 1 % ophthalmic suspension 1 drop daily. As directed.      . Rivaroxaban (XARELTO) 20 MG TABS tablet Take 1 tablet (20 mg total) by mouth daily with supper.  90 tablet  3  . rOPINIRole (REQUIP) 1 MG tablet TAKE TWO TABLETS BY MOUTH AT BEDTIME  180 tablet  3  . traZODone (DESYREL) 50 MG tablet ONE-HALF TABLET AT BEDTIME AS NEEDED FORSLEEP  20 tablet  6  . venlafaxine (EFFEXOR) 37.5 MG tablet Take 37.5 mg by mouth as needed.       No current facility-administered medications on file prior to visit.     Past Medical History  Diagnosis Date  . GERD (gastroesophageal reflux disease) 05/1999  . Hyperlipidemia 12/2003  . Hypertension 1990  . Osteoarthritis 08/21/1989  . Osteoporosis 11/1997  . DDD (degenerative disc disease), lumbosacral 12/06/1992  . History of shingles     ophthalmic, takes acyclovir daily  . Cancer     skin cancer  . Paroxysmal atrial flutter 03/16/2012    on xarelto  . RLS (restless legs syndrome)   . BCC (basal cell carcinoma of skin) 05/2011    on nose     Past Surgical History  Procedure Laterality Date  . Abdominal hysterectomy  Age 71 - 3  S/P TAH  . Cervical discectomy  1992    Fusion (Dr. Joya Salm)  . Esophagogastroduodenoscopy  01/01/2002    with ulcer, bx. neg; + stricture gastric ulcer with hemorrhage  . Esophagogastroduodenoscopy  04/23/2004    Esophageal stricture -- dilated  . Abdominal ultrasound  04/23/2004    Gallstones  . Cataract extraction  05/2010    left eye  . Skin cancer excision  05/20/2011    BCC from nose  . US echocardiography  03/2012    in flutter, EF 60%, mild-mod aortic, mitral, tricuspid regurg, mildly dilated LA  . Nuclear stress test  03/2012    no ischemia     Family History  Problem Relation Age of Onset  . Emphysema Mother   . Heart disease Father     MI  . Stroke Father     first at age 6.  . Stroke Sister   . Diabetes Sister   . Hypertension Brother   . Heart disease Brother     Heart Valve  .  Stroke Brother   . Diabetes Brother   . Cancer Brother     esophageal  . Diabetes Brother   . Diabetes Brother      History   Social History  . Marital Status: Married    Spouse Name: N/A    Number of Children: 10  . Years of Education: N/A   Occupational History  . Retired - [employer]    Social History Main Topics  . Smoking status: Former Smoker -- 0.25 packs/day for 1 years  . Smokeless tobacco: Never Used     Comment: pt smokes occasionally "socially"  . Alcohol Use: No     Comment: wine occassionally  . Drug Use: No  . Sexual Activity: No   Other Topics Concern  . Not on file   Social History Narrative   Lives with grandson who smokes, dog   From [place].  Widowed 01/1999; 1st marriage 25 years / 2nd marriage 13 years.   One son deceased January 19, 2023.   Activity: active with dog   Diet: good water, fruits/vegetables daily       PHYSICAL EXAM   BP 158/98  Pulse 93  Ht 5\' 4"  (1.626 m)  Wt 152 lb 4 oz (69.06 kg)  BMI 26.12 kg/m2  Constitutional: She is oriented to person, place, and time. She appears well-developed and well-nourished. No distress.  HENT: No nasal discharge.  Head: Normocephalic and atraumatic.  Eyes: Pupils are equal and round. Right eye exhibits no discharge. Left eye exhibits no discharge.  Neck: Normal range of motion. Neck supple. No JVD present. No thyromegaly present.  Cardiovascular: Regular rate, irregular rhythm, normal heart sounds. Exam reveals no gallop and no friction rub. No murmur heard.  Pulmonary/Chest: Effort normal and breath sounds normal. No stridor. No respiratory distress. She has no wheezes. She has no rales. She exhibits no tenderness.  Abdominal: Soft. Bowel sounds are normal. She exhibits no distension. There is no tenderness. There is no rebound and no guarding.  Musculoskeletal: Normal range of motion. She exhibits no edema and no tenderness.  Neurological: She is alert and oriented to person, place, and time.  Coordination normal.  Skin: Skin is warm and dry. No rash noted. She is not diaphoretic. No erythema. No pallor.  Psychiatric: She has a normal mood and affect. Her behavior is normal. Judgment and thought content normal.    EKG: Atrial fibrillation  -irregular conduction  -Prominent R(V1) and right axis -consider  right ventricular hypertrophy  -consider pulmonary disease.   -Nonspecific ST depression.   ABNORMAL

## 2013-08-23 NOTE — Patient Instructions (Signed)
Your physician has recommended you make the following change in your medication:  Start Coumadin 5 mg daily  Increase Diltiazem to 240 mg daily  Start Levaquin 500 mg daily for 5 days    Your physician recommends that you schedule a follow-up appointment in:  Dr Fletcher Anon in 3 months     Please enroll on our Coumadin clinic and come for your first appt Wednesday 5/20

## 2013-08-23 NOTE — Assessment & Plan Note (Signed)
Ventricular rate is not optimally controlled. Thus, I increased the dose of diltiazem extended release to 240 mg once daily. Continue metoprolol 50 mg twice daily. She is not able to afford a NOAC. Thus, I started treatment with warfarin and indoor in our anticoagulation clinic. Given that he will be on an antibiotic, I asked her not to start warfarin until she finishes the antibiotic course.

## 2013-08-23 NOTE — Assessment & Plan Note (Signed)
Blood pressure is elevated. Diltiazem was increased as outlined above.

## 2013-08-24 NOTE — Telephone Encounter (Signed)
Pt returned call  Informed patient of Dr. Jacklynn Ganong instructions  Patient verbalized understanding  Coumadin clinic appt changed

## 2013-09-01 ENCOUNTER — Ambulatory Visit (INDEPENDENT_AMBULATORY_CARE_PROVIDER_SITE_OTHER): Payer: Medicare Other

## 2013-09-01 DIAGNOSIS — I4892 Unspecified atrial flutter: Secondary | ICD-10-CM

## 2013-09-01 DIAGNOSIS — Z7689 Persons encountering health services in other specified circumstances: Secondary | ICD-10-CM

## 2013-09-01 DIAGNOSIS — Z0189 Encounter for other specified special examinations: Secondary | ICD-10-CM

## 2013-09-01 DIAGNOSIS — Z5181 Encounter for therapeutic drug level monitoring: Secondary | ICD-10-CM

## 2013-09-01 LAB — POCT INR: INR: 1.8

## 2013-09-01 NOTE — Patient Instructions (Signed)

## 2013-09-02 ENCOUNTER — Encounter: Payer: Self-pay | Admitting: Family Medicine

## 2013-09-02 ENCOUNTER — Telehealth: Payer: Self-pay | Admitting: *Deleted

## 2013-09-02 ENCOUNTER — Ambulatory Visit (INDEPENDENT_AMBULATORY_CARE_PROVIDER_SITE_OTHER)
Admission: RE | Admit: 2013-09-02 | Discharge: 2013-09-02 | Disposition: A | Discharge status: Home / Self Care | Payer: Medicare Other | Source: Ambulatory Visit | Attending: Family Medicine | Admitting: Family Medicine

## 2013-09-02 ENCOUNTER — Ambulatory Visit (INDEPENDENT_AMBULATORY_CARE_PROVIDER_SITE_OTHER): Payer: Medicare Other | Admitting: Family Medicine

## 2013-09-02 VITALS — BP 110/60 | HR 75 | Temp 97.9°F | Wt 149.0 lb

## 2013-09-02 DIAGNOSIS — R059 Cough, unspecified: Secondary | ICD-10-CM

## 2013-09-02 DIAGNOSIS — J209 Acute bronchitis, unspecified: Secondary | ICD-10-CM

## 2013-09-02 DIAGNOSIS — R05 Cough: Secondary | ICD-10-CM

## 2013-09-02 MED ORDER — GUAIFENESIN-CODEINE 100-10 MG/5ML PO SYRP: 5.0000 mL | ORAL_SOLUTION | Freq: Two times a day (BID) | ORAL | Status: DC | PRN

## 2013-09-02 MED ORDER — AZITHROMYCIN 250 MG PO TABS: ORAL_TABLET | ORAL | Status: DC

## 2013-09-02 NOTE — Assessment & Plan Note (Signed)
5+ wk h/o persistent nagging cough - check CXR today. Check pertussis swab today. Treat for atypical bronchitis with zpack. cheratussin for cough. Call coumadin clinic for dosing change if needed. Update if not improving as expected. Pt agrees with plan.

## 2013-09-02 NOTE — Addendum Note (Signed)
Addended by: Royann Shivers A on: 09/02/2013 03:38 PM   Modules accepted: Orders

## 2013-09-02 NOTE — Patient Instructions (Signed)
Xray today. Call [name] as I'm placing you on zpack - to see if we need to change coumadin dosing. cheratussin for cough at night time as it can make you sleepy. Swab for pertussis today. Good to see you, call us with questions or if not improving as expected.

## 2013-09-02 NOTE — Progress Notes (Signed)
BP 110/60  Pulse 75  Temp(Src) 97.9 F (36.6 C) (Tympanic)  Wt 149 lb (67.586 kg)  SpO2 98%   CC: cough  Subjective:    Patient ID: [name], female    DOB: 18-Sep-1929, 80 y.o.   MRN: [id number]  HPI: [name] is a 80 y.o. female presenting on 09/02/2013 for Cough   5wk h/o dry cough with some associated chest discomfort. Now more productive of clear mucous. 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  Seen here 07/23/2013 with presumed allergy induced cough , rec claritin. This did not improve cough. Cough got worse, then seen Dr. Fletcher Anon with rx for levaquin 500mg  x 5 d course.  Persistent coughing with fits and diaphoresis.  This weekend felt especially bad, but today feeling better. Stress incontinence with cough. Some headache.  No fevers/chills, ear or tooth pain, congestion or PNdrainage or ST.  Has tried nyquil, dayquil, cough drops.  Pertussis outbreak in community but pt denies sick contacts. No smokers at home. No h/o asthma.  H/o afib on coumadin.  Past Medical History  Diagnosis Date  . GERD (gastroesophageal reflux disease) 05/1999  . Hyperlipidemia 12/2003  . Hypertension 1990  . Osteoarthritis 08/21/1989  . Osteoporosis 11/1997  . DDD (degenerative disc disease), lumbosacral 12/06/1992  . History of shingles     ophthalmic, takes acyclovir daily  . Cancer     skin cancer  . Paroxysmal atrial flutter 03/16/2012    on coumadin  . RLS (restless legs syndrome)   . BCC (basal cell carcinoma of skin) 05/2011    on nose     Relevant past medical, surgical, family and social history reviewed and updated as indicated.  Allergies and medications reviewed and updated. Current Outpatient Prescriptions on File Prior to Visit  Medication Sig  . acyclovir (ZOVIRAX) 400 MG tablet Take 400 mg by mouth daily.    Marland Kitchen atorvastatin (LIPITOR) 20 MG tablet Take 1 tablet (20 mg total) by mouth daily.  . calcium-vitamin D (OSCAL WITH D) 500-200  MG-UNIT per tablet Take 1 tablet by mouth daily.    . cholecalciferol (VITAMIN D) 1000 UNITS tablet Take 1,000 Units by mouth daily.   . cyanocobalamin ([facility] VITAMIN B12) 1000 MCG tablet Take 1,000 mcg by mouth daily.  Marland Kitchen diltiazem (CARDIZEM CD) 240 MG 24 hr capsule Take 1 capsule (240 mg total) by mouth daily.  . ferrous sulfate 325 (65 FE) MG tablet Take 1 tablet (325 mg total) by mouth daily with breakfast.  . glucosamine-chondroitin 500-400 MG tablet Take 1 tablet by mouth daily.   . hydrochlorothiazide (MICROZIDE) 12.5 MG capsule TAKE 1 CAPSULE BY MOUTH DAILY  . metoprolol (LOPRESSOR) 100 MG tablet Take 0.5 tablets (50 mg total) by mouth 2 (two) times daily.  . pantoprazole (PROTONIX) 40 MG tablet TAKE ONE TABLET BY MOUTH ONCE A DAY  . prednisoLONE acetate (PRED FORTE) 1 % ophthalmic suspension 1 drop daily. As directed.  Marland Kitchen rOPINIRole (REQUIP) 1 MG tablet TAKE TWO TABLETS BY MOUTH AT BEDTIME  . traZODone (DESYREL) 50 MG tablet ONE-HALF TABLET AT BEDTIME AS NEEDED FORSLEEP  . venlafaxine (EFFEXOR) 37.5 MG tablet Take 37.5 mg by mouth as needed.  . warfarin (COUMADIN) 5 MG tablet Take 1 tablet (5 mg total) by mouth daily.   No current facility-administered medications on file prior to visit.    Review of Systems Per HPI unless specifically indicated above    Objective:    BP 110/60  Pulse 75  Temp(Src) 97.9 F (36.6 C) (Tympanic)  Wt 149 lb (67.586 kg)  SpO2 98%  Physical Exam  Nursing note and vitals reviewed. Constitutional: She appears well-developed and well-nourished. No distress.  Coughing fits with rattly cough that take breath away  HENT:  Head: Normocephalic and atraumatic.  Right Ear: Hearing, tympanic membrane, external ear and ear canal normal.  Left Ear: Hearing, tympanic membrane, external ear and ear canal normal.  Nose: No mucosal edema or rhinorrhea. Right sinus exhibits no maxillary sinus tenderness and no frontal sinus tenderness. Left sinus exhibits no  maxillary sinus tenderness and no frontal sinus tenderness.  Mouth/Throat: Uvula is midline, oropharynx is clear and moist and mucous membranes are normal. No oropharyngeal exudate, posterior oropharyngeal edema, posterior oropharyngeal erythema or tonsillar abscesses.  Eyes: Conjunctivae and EOM are normal. Pupils are equal, round, and reactive to light. No scleral icterus.  Neck: Normal range of motion. Neck supple.  Cardiovascular: Normal rate, regular rhythm, normal heart sounds and intact distal pulses.   No murmur heard. Pulmonary/Chest: Effort normal and breath sounds normal. No respiratory distress. She has no wheezes. She has no rales.  Lymphadenopathy:    She has no cervical adenopathy.  Skin: Skin is warm and dry. No rash noted.       Assessment & Plan:   Problem List Items Addressed This Visit   Bronchitis, acute - Primary     5+ wk h/o persistent nagging cough - check CXR today. Check pertussis swab today. Treat for atypical bronchitis with zpack. cheratussin for cough. Call coumadin clinic for dosing change if needed. Update if not improving as expected. Pt agrees with plan.    Relevant Orders      DG Chest 2 View       Follow up plan: Return if symptoms worsen or fail to improve.

## 2013-09-02 NOTE — Progress Notes (Signed)
Pre visit review using our clinic review tool, if applicable. No additional management support is needed unless otherwise documented below in the visit note. 

## 2013-09-02 NOTE — Telephone Encounter (Signed)
Pt called stating had been prescribed Zpak and Robitussin AC for bronchitis  and wants to know if any interaction between Zpak and coumadin. Pt instructed that it is ok for her to take Zpak and Coumadin but to be sure to keep her appt in the coumadin clinic on June 3rd and she states understanding.

## 2013-09-08 ENCOUNTER — Ambulatory Visit (INDEPENDENT_AMBULATORY_CARE_PROVIDER_SITE_OTHER): Payer: Medicare Other

## 2013-09-08 DIAGNOSIS — Z0189 Encounter for other specified special examinations: Secondary | ICD-10-CM

## 2013-09-08 DIAGNOSIS — I4892 Unspecified atrial flutter: Secondary | ICD-10-CM

## 2013-09-08 DIAGNOSIS — Z7689 Persons encountering health services in other specified circumstances: Secondary | ICD-10-CM

## 2013-09-08 DIAGNOSIS — Z5181 Encounter for therapeutic drug level monitoring: Secondary | ICD-10-CM

## 2013-09-08 LAB — POCT INR: INR: 2.7

## 2013-09-15 ENCOUNTER — Ambulatory Visit (INDEPENDENT_AMBULATORY_CARE_PROVIDER_SITE_OTHER): Payer: Medicare Other

## 2013-09-15 DIAGNOSIS — Z5181 Encounter for therapeutic drug level monitoring: Secondary | ICD-10-CM

## 2013-09-15 DIAGNOSIS — I4892 Unspecified atrial flutter: Secondary | ICD-10-CM

## 2013-09-15 DIAGNOSIS — Z7689 Persons encountering health services in other specified circumstances: Secondary | ICD-10-CM

## 2013-09-15 DIAGNOSIS — Z0189 Encounter for other specified special examinations: Secondary | ICD-10-CM

## 2013-09-15 LAB — POCT INR: INR: 2.6

## 2013-09-16 LAB — CULTURE, BORDETELLA PERTUSSIS

## 2013-09-22 ENCOUNTER — Other Ambulatory Visit: Payer: Self-pay | Admitting: Family Medicine

## 2013-09-29 ENCOUNTER — Ambulatory Visit (INDEPENDENT_AMBULATORY_CARE_PROVIDER_SITE_OTHER): Payer: Medicare Other

## 2013-09-29 DIAGNOSIS — Z7689 Persons encountering health services in other specified circumstances: Secondary | ICD-10-CM

## 2013-09-29 DIAGNOSIS — I4892 Unspecified atrial flutter: Secondary | ICD-10-CM

## 2013-09-29 DIAGNOSIS — Z0189 Encounter for other specified special examinations: Secondary | ICD-10-CM

## 2013-09-29 DIAGNOSIS — Z5181 Encounter for therapeutic drug level monitoring: Secondary | ICD-10-CM

## 2013-09-29 LAB — POCT INR: INR: 5.1

## 2013-10-06 ENCOUNTER — Ambulatory Visit (INDEPENDENT_AMBULATORY_CARE_PROVIDER_SITE_OTHER): Payer: Medicare Other | Admitting: Pharmacist

## 2013-10-06 DIAGNOSIS — I4892 Unspecified atrial flutter: Secondary | ICD-10-CM

## 2013-10-06 DIAGNOSIS — Z5181 Encounter for therapeutic drug level monitoring: Secondary | ICD-10-CM

## 2013-10-06 DIAGNOSIS — Z7689 Persons encountering health services in other specified circumstances: Secondary | ICD-10-CM

## 2013-10-06 DIAGNOSIS — Z0189 Encounter for other specified special examinations: Secondary | ICD-10-CM

## 2013-10-06 LAB — POCT INR: INR: 1.4

## 2013-10-13 ENCOUNTER — Ambulatory Visit (INDEPENDENT_AMBULATORY_CARE_PROVIDER_SITE_OTHER): Payer: Medicare Other

## 2013-10-13 DIAGNOSIS — I4892 Unspecified atrial flutter: Secondary | ICD-10-CM

## 2013-10-13 DIAGNOSIS — Z0189 Encounter for other specified special examinations: Secondary | ICD-10-CM

## 2013-10-13 DIAGNOSIS — Z7689 Persons encountering health services in other specified circumstances: Secondary | ICD-10-CM

## 2013-10-13 DIAGNOSIS — Z5181 Encounter for therapeutic drug level monitoring: Secondary | ICD-10-CM

## 2013-10-13 LAB — POCT INR: INR: 3.3

## 2013-10-27 ENCOUNTER — Ambulatory Visit (INDEPENDENT_AMBULATORY_CARE_PROVIDER_SITE_OTHER): Payer: Medicare Other

## 2013-10-27 DIAGNOSIS — Z5181 Encounter for therapeutic drug level monitoring: Secondary | ICD-10-CM

## 2013-10-27 DIAGNOSIS — I4892 Unspecified atrial flutter: Secondary | ICD-10-CM

## 2013-10-27 DIAGNOSIS — Z0189 Encounter for other specified special examinations: Secondary | ICD-10-CM

## 2013-10-27 DIAGNOSIS — Z7689 Persons encountering health services in other specified circumstances: Secondary | ICD-10-CM

## 2013-10-27 LAB — POCT INR: INR: 1.4

## 2013-10-28 ENCOUNTER — Encounter: Payer: Self-pay | Admitting: Family Medicine

## 2013-10-28 ENCOUNTER — Ambulatory Visit (INDEPENDENT_AMBULATORY_CARE_PROVIDER_SITE_OTHER): Payer: Medicare Other | Admitting: Family Medicine

## 2013-10-28 VITALS — BP 126/68 | HR 68 | Temp 97.8°F | Wt 144.0 lb

## 2013-10-28 DIAGNOSIS — R0989 Other specified symptoms and signs involving the circulatory and respiratory systems: Secondary | ICD-10-CM

## 2013-10-28 DIAGNOSIS — R5383 Other fatigue: Secondary | ICD-10-CM | POA: Insufficient documentation

## 2013-10-28 DIAGNOSIS — R0609 Other forms of dyspnea: Secondary | ICD-10-CM

## 2013-10-28 DIAGNOSIS — I4891 Unspecified atrial fibrillation: Secondary | ICD-10-CM

## 2013-10-28 DIAGNOSIS — R5381 Other malaise: Secondary | ICD-10-CM

## 2013-10-28 DIAGNOSIS — I4819 Other persistent atrial fibrillation: Secondary | ICD-10-CM

## 2013-10-28 DIAGNOSIS — I10 Essential (primary) hypertension: Secondary | ICD-10-CM

## 2013-10-28 NOTE — Progress Notes (Signed)
Pre visit review using our clinic review tool, if applicable. No additional management support is needed unless otherwise documented below in the visit note. 

## 2013-10-28 NOTE — Patient Instructions (Signed)
i'm sorry you're not feeling well! Let's check some blood work today and we will call you with results. Push lost of fluid and rest over next few days. Let us know if not improving with time.

## 2013-10-28 NOTE — Assessment & Plan Note (Signed)
Ongoing for last several months. Will check ESS today given some sxs endorsed as well as check BNP with endorsed orthopnea. Clinically seems euvolemic. Check CBC, BMP, TSH, BNP and B12 levels today.  Will call with results.

## 2013-10-28 NOTE — Progress Notes (Signed)
BP 126/68  Pulse 68  Temp(Src) 97.8 F (36.6 C) (Oral)  Wt 144 lb (65.318 kg)   CC: not feeling well  Subjective:    Patient ID: [name], female    DOB: 01-Jan-1930, 80 y.o.   MRN: [id number]  HPI: [name] is a 80 y.o. female presenting on 10/28/2013 for Fatigue   Ongoing fatigue over last few weeks. Mild dizziness when wakes up in am. Feels more tired with small amounts of exertion. Some dyspnea noted as well. Wonders if this is related to coumadin use. occasional tightness under left breast and intermittent pain in ribcage. Occasional orthopnea and PNdyspnea. Not restful sleep. Noticed some blood with wiping a few days ago.  No chest pain, no cough.  No fevers/chills, abd pain, diarrhea constipation, nausea. No leg swelling, no snoring.  Requip has helped legs, has been on this for last few years.  On coumadin for last 2-3 months. Unable to afford xarelto. Lab Results  Component Value Date   INR 1.4 10/27/2013   INR 3.3 10/13/2013   INR 1.4 10/06/2013    Past Medical History  Diagnosis Date  . GERD (gastroesophageal reflux disease) 05/1999  . Hyperlipidemia 12/2003  . Hypertension 1990  . Osteoarthritis 08/21/1989  . Osteoporosis with fracture 11/1997    with compression fracture lower thoracic vertebra  . DDD (degenerative disc disease), lumbosacral 12/06/1992  . History of shingles     ophthalmic, takes acyclovir daily  . Cancer     skin cancer  . Paroxysmal atrial flutter 03/16/2012    on coumadin  . RLS (restless legs syndrome)   . BCC (basal cell carcinoma of skin) 05/2011    on nose   Relevant past medical, surgical, family and social history reviewed and updated as indicated.  Allergies and medications reviewed and updated. Current Outpatient Prescriptions on File Prior to Visit  Medication Sig  . acyclovir (ZOVIRAX) 400 MG tablet Take 400 mg by mouth daily.    Marland Kitchen atorvastatin (LIPITOR) 20 MG tablet Take 1 tablet (20 mg total) by mouth daily.  .  calcium-vitamin D (OSCAL WITH D) 500-200 MG-UNIT per tablet Take 1 tablet by mouth daily.    . cholecalciferol (VITAMIN D) 1000 UNITS tablet Take 1,000 Units by mouth daily.   . cyanocobalamin ([facility] VITAMIN B12) 1000 MCG tablet Take 1,000 mcg by mouth daily.  Marland Kitchen diltiazem (CARDIZEM CD) 240 MG 24 hr capsule Take 1 capsule (240 mg total) by mouth daily.  . ferrous sulfate 325 (65 FE) MG tablet Take 1 tablet (325 mg total) by mouth daily with breakfast.  . glucosamine-chondroitin 500-400 MG tablet Take 1 tablet by mouth daily.   . hydrochlorothiazide (MICROZIDE) 12.5 MG capsule TAKE 1 CAPSULE BY MOUTH DAILY  . metoprolol (LOPRESSOR) 100 MG tablet Take 0.5 tablets (50 mg total) by mouth 2 (two) times daily.  . pantoprazole (PROTONIX) 40 MG tablet TAKE ONE TABLET BY MOUTH ONCE A DAY  . prednisoLONE acetate (PRED FORTE) 1 % ophthalmic suspension 1 drop daily. As directed.  Marland Kitchen rOPINIRole (REQUIP) 1 MG tablet TAKE TWO TABLETS BY MOUTH AT BEDTIME  . traZODone (DESYREL) 50 MG tablet ONE-HALF TABLET AT BEDTIME AS NEEDED FORSLEEP  . warfarin (COUMADIN) 5 MG tablet Take 1 tablet (5 mg total) by mouth daily.   No current facility-administered medications on file prior to visit.    Review of Systems Per HPI unless specifically indicated above    Objective:    BP 126/68  Pulse 68  Temp(Src) 97.8 F (36.6 C) (Oral)  Wt 144 lb (65.318 kg)  Physical Exam  Nursing note and vitals reviewed. Constitutional: She appears well-developed and well-nourished. No distress.  HENT:  Mouth/Throat: Oropharynx is clear and moist. No oropharyngeal exudate.  Eyes: Conjunctivae and EOM are normal. Pupils are equal, round, and reactive to light. No scleral icterus.  Neck: Normal range of motion. Neck supple. Hepatojugular reflux (very mild) present. No JVD present. No thyromegaly present.  Cardiovascular: Normal rate, regular rhythm, normal heart sounds and intact distal pulses.   No murmur heard. Pulmonary/Chest: Effort  normal and breath sounds normal. No respiratory distress. She has no wheezes. She has no rales.  Musculoskeletal: She exhibits no edema.  Lymphadenopathy:    She has no cervical adenopathy.  Skin: Skin is warm and dry. No rash noted. No pallor.  Psychiatric: She has a normal mood and affect.       Assessment & Plan:   Problem List Items Addressed This Visit   Persistent atrial fibrillation   Relevant Orders      Brain natriuretic peptide   HYPERTENSION   Fatigue - Primary     Ongoing for last several months. Will check ESS today given some sxs endorsed as well as check BNP with endorsed orthopnea. Clinically seems euvolemic. Check CBC, BMP, TSH, BNP and B12 levels today.  Will call with results.    Relevant Orders      Vitamin B12      TSH      CBC with Differential      Basic metabolic panel       Follow up plan: Return if symptoms worsen or fail to improve.

## 2013-10-29 LAB — CBC WITH DIFFERENTIAL/PLATELET
BASOS PCT: 1 % (ref 0.0–3.0)
Basophils Absolute: 0.1 10*3/uL (ref 0.0–0.1)
EOS PCT: 2.6 % (ref 0.0–5.0)
Eosinophils Absolute: 0.2 10*3/uL (ref 0.0–0.7)
HEMATOCRIT: 42.3 % (ref 36.0–46.0)
Hemoglobin: 14.2 g/dL (ref 12.0–15.0)
LYMPHS ABS: 1.6 10*3/uL (ref 0.7–4.0)
Lymphocytes Relative: 24 % (ref 12.0–46.0)
MCHC: 33.6 g/dL (ref 30.0–36.0)
MCV: 94.6 fl (ref 78.0–100.0)
MONOS PCT: 9.5 % (ref 3.0–12.0)
Monocytes Absolute: 0.6 10*3/uL (ref 0.1–1.0)
Neutro Abs: 4.2 10*3/uL (ref 1.4–7.7)
Neutrophils Relative %: 62.9 % (ref 43.0–77.0)
PLATELETS: 261 10*3/uL (ref 150.0–400.0)
RBC: 4.47 Mil/uL (ref 3.87–5.11)
RDW: 13.5 % (ref 11.5–15.5)
WBC: 6.6 10*3/uL (ref 4.0–10.5)

## 2013-10-29 LAB — BASIC METABOLIC PANEL
BUN: 21 mg/dL (ref 6–23)
CO2: 28 meq/L (ref 19–32)
Calcium: 9.9 mg/dL (ref 8.4–10.5)
Chloride: 103 mEq/L (ref 96–112)
Creatinine, Ser: 1.3 mg/dL — ABNORMAL HIGH (ref 0.4–1.2)
GFR: 42.78 mL/min — ABNORMAL LOW (ref >60.00)
GLUCOSE: 101 mg/dL — AB (ref 70–99)
POTASSIUM: 3.9 meq/L (ref 3.5–5.1)
Sodium: 139 mEq/L (ref 135–145)

## 2013-10-29 LAB — TSH: TSH: 5.32 u[IU]/mL — AB (ref 0.35–4.50)

## 2013-10-29 LAB — VITAMIN B12: VITAMIN B 12: 677 pg/mL (ref 211–911)

## 2013-10-29 LAB — BRAIN NATRIURETIC PEPTIDE: Pro B Natriuretic peptide (BNP): 64 pg/mL (ref 0.0–100.0)

## 2013-10-30 ENCOUNTER — Other Ambulatory Visit: Payer: Self-pay | Admitting: Family Medicine

## 2013-10-30 DIAGNOSIS — R946 Abnormal results of thyroid function studies: Secondary | ICD-10-CM

## 2013-10-30 DIAGNOSIS — N289 Disorder of kidney and ureter, unspecified: Secondary | ICD-10-CM | POA: Insufficient documentation

## 2013-11-10 ENCOUNTER — Ambulatory Visit (INDEPENDENT_AMBULATORY_CARE_PROVIDER_SITE_OTHER): Payer: Medicare Other

## 2013-11-10 DIAGNOSIS — I4892 Unspecified atrial flutter: Secondary | ICD-10-CM

## 2013-11-10 DIAGNOSIS — Z5181 Encounter for therapeutic drug level monitoring: Secondary | ICD-10-CM

## 2013-11-10 DIAGNOSIS — Z7689 Persons encountering health services in other specified circumstances: Secondary | ICD-10-CM

## 2013-11-10 DIAGNOSIS — Z0189 Encounter for other specified special examinations: Secondary | ICD-10-CM

## 2013-11-10 LAB — POCT INR: INR: 2.5

## 2013-11-16 ENCOUNTER — Other Ambulatory Visit: Payer: Medicare Other

## 2013-11-18 ENCOUNTER — Other Ambulatory Visit (INDEPENDENT_AMBULATORY_CARE_PROVIDER_SITE_OTHER): Payer: Medicare Other

## 2013-11-18 DIAGNOSIS — R946 Abnormal results of thyroid function studies: Secondary | ICD-10-CM

## 2013-11-18 DIAGNOSIS — N289 Disorder of kidney and ureter, unspecified: Secondary | ICD-10-CM

## 2013-11-18 LAB — RENAL FUNCTION PANEL
Albumin: 3.9 g/dL (ref 3.5–5.2)
BUN: 11 mg/dL (ref 6–23)
CALCIUM: 8.9 mg/dL (ref 8.4–10.5)
CO2: 25 meq/L (ref 19–32)
Chloride: 104 mEq/L (ref 96–112)
Creatinine, Ser: 0.8 mg/dL (ref 0.4–1.2)
GFR: 77.35 mL/min (ref >60.00)
Glucose, Bld: 134 mg/dL — ABNORMAL HIGH (ref 70–99)
POTASSIUM: 3.5 meq/L (ref 3.5–5.1)
Phosphorus: 2.8 mg/dL (ref 2.3–4.6)
Sodium: 136 mEq/L (ref 135–145)

## 2013-11-18 LAB — TSH: TSH: 0.4 u[IU]/mL (ref 0.35–4.50)

## 2013-11-18 LAB — T4, FREE: FREE T4: 0.79 ng/dL (ref 0.60–1.60)

## 2013-11-19 LAB — T3: T3 TOTAL: 74.7 ng/dL — AB (ref 80.0–204.0)

## 2013-11-26 ENCOUNTER — Ambulatory Visit (INDEPENDENT_AMBULATORY_CARE_PROVIDER_SITE_OTHER): Payer: Medicare Other | Admitting: Cardiovascular Disease

## 2013-11-26 ENCOUNTER — Encounter: Payer: Self-pay | Admitting: Cardiovascular Disease

## 2013-11-26 ENCOUNTER — Ambulatory Visit: Payer: Medicare Other | Admitting: Cardiovascular Disease

## 2013-11-26 VITALS — BP 138/72 | HR 56 | Ht 64.0 in | Wt 147.5 lb

## 2013-11-26 DIAGNOSIS — I10 Essential (primary) hypertension: Secondary | ICD-10-CM

## 2013-11-26 DIAGNOSIS — I4892 Unspecified atrial flutter: Secondary | ICD-10-CM

## 2013-11-26 DIAGNOSIS — I4891 Unspecified atrial fibrillation: Secondary | ICD-10-CM

## 2013-11-26 DIAGNOSIS — I4819 Other persistent atrial fibrillation: Secondary | ICD-10-CM

## 2013-11-26 MED ORDER — METOPROLOL TARTRATE 25 MG PO TABS: 25.0000 mg | ORAL_TABLET | Freq: Two times a day (BID) | ORAL | Status: DC

## 2013-11-26 NOTE — Assessment & Plan Note (Signed)
She is currently in normal sinus rhythm. She is bradycardic and has been complaining of fatigue. It changed metoprolol to 25 mg twice daily. Continue treatment with diltiazem and long-term anticoagulation with warfarin.

## 2013-11-26 NOTE — Progress Notes (Signed)
Primary care physician: Dr. Danise Mina.  HPI  This is a pleasant 80 year old female who is here today for a followup visit regarding persistent atrial flutter/fibrillation.  She also had atypical chest pain. Nuclear stress test in 03/2012 showed no evidence of ischemia. Echocardiogram showed normal LV systolic function, mild to moderate aortic and mitral regurgitation, moderate tricuspid regurgitation with mild pulmonary hypertension.  She was still in atrial fibrillation during last visit with rapid ventricular response. I increased the dose of diltiazem. She was switched from Xarelto to warfarin due to cost. She is overall doing better but has been complaining of fatigue and difficulty sleeping at night. She has been taking metoprolol 100 mg in the morning only and diltiazem 240 mg at night.    Allergies  Allergen Reactions  . Penicillins     REACTION: RASH     Current Outpatient Prescriptions on File Prior to Visit  Medication Sig Dispense Refill  . acyclovir (ZOVIRAX) 400 MG tablet Take 400 mg by mouth daily.        Marland Kitchen atorvastatin (LIPITOR) 20 MG tablet Take 1 tablet (20 mg total) by mouth daily.  30 tablet  6  . calcium-vitamin D (OSCAL WITH D) 500-200 MG-UNIT per tablet Take 1 tablet by mouth daily.        . cholecalciferol (VITAMIN D) 1000 UNITS tablet Take 1,000 Units by mouth daily.       . cyanocobalamin ([facility] VITAMIN B12) 1000 MCG tablet Take 1,000 mcg by mouth daily.      Marland Kitchen diltiazem (CARDIZEM CD) 240 MG 24 hr capsule Take 1 capsule (240 mg total) by mouth daily.  90 capsule  3  . ferrous sulfate 325 (65 FE) MG tablet Take 1 tablet (325 mg total) by mouth daily with breakfast.      . glucosamine-chondroitin 500-400 MG tablet Take 1 tablet by mouth daily.       . pantoprazole (PROTONIX) 40 MG tablet TAKE ONE TABLET BY MOUTH ONCE A DAY  30 tablet  6  . prednisoLONE acetate (PRED FORTE) 1 % ophthalmic suspension 1 drop daily. As directed.      Marland Kitchen rOPINIRole (REQUIP) 1 MG tablet  TAKE TWO TABLETS BY MOUTH AT BEDTIME  180 tablet  3  . traZODone (DESYREL) 50 MG tablet ONE-HALF TABLET AT BEDTIME AS NEEDED FORSLEEP  20 tablet  6  . venlafaxine (EFFEXOR) 37.5 MG tablet TAKE ONE TABLET BY MOUTH EVERY DAY PRN      . warfarin (COUMADIN) 5 MG tablet Take 1 tablet (5 mg total) by mouth daily.  30 tablet  6   No current facility-administered medications on file prior to visit.     Past Medical History  Diagnosis Date  . GERD (gastroesophageal reflux disease) 05/1999  . Hyperlipidemia 12/2003  . Hypertension 1990  . Osteoarthritis 08/21/1989  . Osteoporosis with fracture 11/1997    with compression fracture lower thoracic vertebra  . DDD (degenerative disc disease), lumbosacral 12/06/1992  . History of shingles     ophthalmic, takes acyclovir daily  . Cancer     skin cancer  . Paroxysmal atrial flutter 03/16/2012    on coumadin  . RLS (restless legs syndrome)   . BCC (basal cell carcinoma of skin) 05/2011    on nose     Past Surgical History  Procedure Laterality Date  . Abdominal hysterectomy  Age 70 - 53    S/P TAH  . Cervical discectomy  1992    Fusion (Dr. Joya Salm)  .  Esophagogastroduodenoscopy  01/01/2002    with ulcer, bx. neg; + stricture gastric ulcer with hemorrhage  . Esophagogastroduodenoscopy  04/23/2004    Esophageal stricture -- dilated  . Abdominal ultrasound  04/23/2004    Gallstones  . Cataract extraction  05/2010    left eye  . Skin cancer excision  05/20/2011    BCC from nose  . US echocardiography  03/2012    in flutter, EF 60%, mild-mod aortic, mitral, tricuspid regurg, mildly dilated LA  . Nuclear stress test  03/2012    no ischemia     Family History  Problem Relation Age of Onset  . Emphysema Mother   . Heart disease Father     MI  . Stroke Father     first at age 56.  . Stroke Sister   . Diabetes Sister   . Hypertension Brother   . Heart disease Brother     Heart Valve  . Stroke Brother   . Diabetes Brother   .  Cancer Brother     esophageal  . Diabetes Brother   . Diabetes Brother      History   Social History  . Marital Status: Married    Spouse Name: N/A    Number of Children: 86  . Years of Education: N/A   Occupational History  . Retired - [employer]    Social History Main Topics  . Smoking status: Former Smoker -- 0.25 packs/day for 1 years  . Smokeless tobacco: Never Used     Comment: pt smokes occasionally "socially"  . Alcohol Use: No     Comment: wine occassionally  . Drug Use: No  . Sexual Activity: No   Other Topics Concern  . Not on file   Social History Narrative   Lives with grandson who smokes, dog   From [place].  Widowed 01/1999; 1st marriage 25 years / 2nd marriage 13 years.   One son deceased 02-Feb-2023.   Activity: active with dog   Diet: good water, fruits/vegetables daily       PHYSICAL EXAM   BP 138/72  Pulse 56  Ht 5\' 4"  (1.626 m)  Wt 147 lb 8 oz (66.906 kg)  BMI 25.31 kg/m2  Constitutional: She is oriented to person, place, and time. She appears well-developed and well-nourished. No distress.  HENT: No nasal discharge.  Head: Normocephalic and atraumatic.  Eyes: Pupils are equal and round. Right eye exhibits no discharge. Left eye exhibits no discharge.  Neck: Normal range of motion. Neck supple. No JVD present. No thyromegaly present.  Cardiovascular: Bradycardic, regular rhythm, normal heart sounds. Exam reveals no gallop and no friction rub. No murmur heard.  Pulmonary/Chest: Effort normal and breath sounds normal. No stridor. No respiratory distress. She has no wheezes. She has no rales. She exhibits no tenderness.  Abdominal: Soft. Bowel sounds are normal. She exhibits no distension. There is no tenderness. There is no rebound and no guarding.  Musculoskeletal: Normal range of motion. She exhibits no edema and no tenderness.  Neurological: She is alert and oriented to person, place, and time. Coordination normal.  Skin: Skin is warm and dry.  No rash noted. She is not diaphoretic. No erythema. No pallor.  Psychiatric: She has a normal mood and affect. Her behavior is normal. Judgment and thought content normal.    EKG: Sinus  Bradycardia  -Nonspecific ST depression  -Nondiagnostic.   ABNORMAL

## 2013-11-26 NOTE — Patient Instructions (Signed)
Your physician has recommended you make the following change in your medication:  Decrease Metoprolol to 25 mg twice daily   Your physician recommends that you schedule a follow-up appointment in:  3 months with Dr. Fletcher Anon

## 2013-11-26 NOTE — Assessment & Plan Note (Signed)
Blood pressure is controlled on current medications. 

## 2013-12-01 ENCOUNTER — Ambulatory Visit (INDEPENDENT_AMBULATORY_CARE_PROVIDER_SITE_OTHER): Payer: Medicare Other

## 2013-12-01 DIAGNOSIS — Z5181 Encounter for therapeutic drug level monitoring: Secondary | ICD-10-CM

## 2013-12-01 DIAGNOSIS — Z7689 Persons encountering health services in other specified circumstances: Secondary | ICD-10-CM

## 2013-12-01 DIAGNOSIS — I4892 Unspecified atrial flutter: Secondary | ICD-10-CM

## 2013-12-01 DIAGNOSIS — Z0189 Encounter for other specified special examinations: Secondary | ICD-10-CM

## 2013-12-01 LAB — POCT INR: INR: 2

## 2013-12-22 ENCOUNTER — Ambulatory Visit (INDEPENDENT_AMBULATORY_CARE_PROVIDER_SITE_OTHER): Payer: Medicare Other

## 2013-12-22 DIAGNOSIS — I4892 Unspecified atrial flutter: Secondary | ICD-10-CM

## 2013-12-22 DIAGNOSIS — Z7689 Persons encountering health services in other specified circumstances: Secondary | ICD-10-CM

## 2013-12-22 DIAGNOSIS — Z5181 Encounter for therapeutic drug level monitoring: Secondary | ICD-10-CM

## 2013-12-22 DIAGNOSIS — Z0189 Encounter for other specified special examinations: Secondary | ICD-10-CM

## 2013-12-22 LAB — POCT INR: INR: 2.7

## 2013-12-29 ENCOUNTER — Other Ambulatory Visit: Payer: Self-pay | Admitting: Family Medicine

## 2014-01-19 ENCOUNTER — Ambulatory Visit (INDEPENDENT_AMBULATORY_CARE_PROVIDER_SITE_OTHER): Payer: Medicare Other

## 2014-01-19 DIAGNOSIS — Z0000 Encounter for general adult medical examination without abnormal findings: Secondary | ICD-10-CM

## 2014-01-19 DIAGNOSIS — Z7689 Persons encountering health services in other specified circumstances: Secondary | ICD-10-CM

## 2014-01-19 DIAGNOSIS — I4892 Unspecified atrial flutter: Secondary | ICD-10-CM

## 2014-01-19 DIAGNOSIS — Z5181 Encounter for therapeutic drug level monitoring: Secondary | ICD-10-CM

## 2014-01-19 LAB — POCT INR
INR: 3.4
INR: 3.4

## 2014-01-24 ENCOUNTER — Other Ambulatory Visit: Payer: Self-pay | Admitting: Family Medicine

## 2014-02-02 ENCOUNTER — Other Ambulatory Visit: Payer: Self-pay | Admitting: Cardiovascular Disease

## 2014-02-16 ENCOUNTER — Ambulatory Visit (INDEPENDENT_AMBULATORY_CARE_PROVIDER_SITE_OTHER): Payer: Medicare Other | Admitting: Pharmacist

## 2014-02-16 DIAGNOSIS — I4892 Unspecified atrial flutter: Secondary | ICD-10-CM

## 2014-02-16 DIAGNOSIS — Z7689 Persons encountering health services in other specified circumstances: Secondary | ICD-10-CM

## 2014-02-16 DIAGNOSIS — Z5181 Encounter for therapeutic drug level monitoring: Secondary | ICD-10-CM

## 2014-02-16 DIAGNOSIS — Z0000 Encounter for general adult medical examination without abnormal findings: Secondary | ICD-10-CM

## 2014-02-16 LAB — POCT INR: INR: 2.5

## 2014-02-28 ENCOUNTER — Ambulatory Visit: Payer: Medicare Other | Admitting: Cardiovascular Disease

## 2014-03-16 ENCOUNTER — Ambulatory Visit (INDEPENDENT_AMBULATORY_CARE_PROVIDER_SITE_OTHER): Payer: Medicare Other

## 2014-03-16 DIAGNOSIS — Z7689 Persons encountering health services in other specified circumstances: Secondary | ICD-10-CM

## 2014-03-16 DIAGNOSIS — I4892 Unspecified atrial flutter: Secondary | ICD-10-CM

## 2014-03-16 DIAGNOSIS — Z5181 Encounter for therapeutic drug level monitoring: Secondary | ICD-10-CM

## 2014-03-16 DIAGNOSIS — Z0000 Encounter for general adult medical examination without abnormal findings: Secondary | ICD-10-CM

## 2014-03-16 LAB — POCT INR: INR: 2.4

## 2014-04-05 ENCOUNTER — Other Ambulatory Visit: Payer: Self-pay | Admitting: Family Medicine

## 2014-04-05 NOTE — Telephone Encounter (Signed)
Ok to refill 

## 2014-04-13 ENCOUNTER — Ambulatory Visit (INDEPENDENT_AMBULATORY_CARE_PROVIDER_SITE_OTHER): Payer: Medicare Other

## 2014-04-13 DIAGNOSIS — Z0000 Encounter for general adult medical examination without abnormal findings: Secondary | ICD-10-CM | POA: Diagnosis not present

## 2014-04-13 DIAGNOSIS — I4892 Unspecified atrial flutter: Secondary | ICD-10-CM

## 2014-04-13 DIAGNOSIS — Z5181 Encounter for therapeutic drug level monitoring: Secondary | ICD-10-CM

## 2014-04-13 DIAGNOSIS — Z7689 Persons encountering health services in other specified circumstances: Secondary | ICD-10-CM

## 2014-04-13 LAB — POCT INR: INR: 1.8

## 2014-04-14 DIAGNOSIS — B0233 Zoster keratitis: Secondary | ICD-10-CM | POA: Diagnosis not present

## 2014-05-03 ENCOUNTER — Other Ambulatory Visit: Payer: Self-pay | Admitting: Cardiovascular Disease

## 2014-05-03 NOTE — Telephone Encounter (Signed)
Please review for refill on warfarin. Thanks!

## 2014-05-11 ENCOUNTER — Ambulatory Visit (INDEPENDENT_AMBULATORY_CARE_PROVIDER_SITE_OTHER): Payer: Medicare Other

## 2014-05-11 DIAGNOSIS — Z5181 Encounter for therapeutic drug level monitoring: Secondary | ICD-10-CM

## 2014-05-11 DIAGNOSIS — Z0000 Encounter for general adult medical examination without abnormal findings: Secondary | ICD-10-CM | POA: Diagnosis not present

## 2014-05-11 DIAGNOSIS — Z7689 Persons encountering health services in other specified circumstances: Secondary | ICD-10-CM

## 2014-05-11 DIAGNOSIS — I4892 Unspecified atrial flutter: Secondary | ICD-10-CM | POA: Diagnosis not present

## 2014-05-11 LAB — POCT INR: INR: 1.7

## 2014-06-01 ENCOUNTER — Ambulatory Visit (INDEPENDENT_AMBULATORY_CARE_PROVIDER_SITE_OTHER): Payer: Medicare Other

## 2014-06-01 DIAGNOSIS — Z5181 Encounter for therapeutic drug level monitoring: Secondary | ICD-10-CM

## 2014-06-01 DIAGNOSIS — I4892 Unspecified atrial flutter: Secondary | ICD-10-CM | POA: Diagnosis not present

## 2014-06-01 DIAGNOSIS — Z0000 Encounter for general adult medical examination without abnormal findings: Secondary | ICD-10-CM | POA: Diagnosis not present

## 2014-06-01 DIAGNOSIS — Z7689 Persons encountering health services in other specified circumstances: Secondary | ICD-10-CM

## 2014-06-01 LAB — POCT INR: INR: 3.4

## 2014-06-02 DIAGNOSIS — L905 Scar conditions and fibrosis of skin: Secondary | ICD-10-CM | POA: Diagnosis not present

## 2014-06-09 ENCOUNTER — Other Ambulatory Visit: Payer: Self-pay | Admitting: Cardiovascular Disease

## 2014-06-09 NOTE — Telephone Encounter (Signed)
Please review for refill. Thanks!  

## 2014-06-15 ENCOUNTER — Ambulatory Visit (INDEPENDENT_AMBULATORY_CARE_PROVIDER_SITE_OTHER): Payer: Medicare Other

## 2014-06-15 DIAGNOSIS — Z0000 Encounter for general adult medical examination without abnormal findings: Secondary | ICD-10-CM | POA: Diagnosis not present

## 2014-06-15 DIAGNOSIS — Z5181 Encounter for therapeutic drug level monitoring: Secondary | ICD-10-CM

## 2014-06-15 DIAGNOSIS — Z7689 Persons encountering health services in other specified circumstances: Secondary | ICD-10-CM

## 2014-06-15 DIAGNOSIS — I4892 Unspecified atrial flutter: Secondary | ICD-10-CM

## 2014-06-15 LAB — POCT INR: INR: 2.3

## 2014-07-06 ENCOUNTER — Ambulatory Visit (INDEPENDENT_AMBULATORY_CARE_PROVIDER_SITE_OTHER): Payer: Medicare Other

## 2014-07-06 DIAGNOSIS — Z0000 Encounter for general adult medical examination without abnormal findings: Secondary | ICD-10-CM | POA: Diagnosis not present

## 2014-07-06 DIAGNOSIS — Z7689 Persons encountering health services in other specified circumstances: Secondary | ICD-10-CM

## 2014-07-06 DIAGNOSIS — I4892 Unspecified atrial flutter: Secondary | ICD-10-CM | POA: Diagnosis not present

## 2014-07-06 DIAGNOSIS — Z5181 Encounter for therapeutic drug level monitoring: Secondary | ICD-10-CM | POA: Diagnosis not present

## 2014-07-06 LAB — POCT INR: INR: 1.7

## 2014-07-19 ENCOUNTER — Other Ambulatory Visit: Payer: Self-pay | Admitting: Family Medicine

## 2014-07-19 ENCOUNTER — Other Ambulatory Visit: Payer: Self-pay | Admitting: Cardiovascular Disease

## 2014-07-27 ENCOUNTER — Ambulatory Visit (INDEPENDENT_AMBULATORY_CARE_PROVIDER_SITE_OTHER): Payer: Medicare Other

## 2014-07-27 DIAGNOSIS — Z5181 Encounter for therapeutic drug level monitoring: Secondary | ICD-10-CM

## 2014-07-27 DIAGNOSIS — Z0000 Encounter for general adult medical examination without abnormal findings: Secondary | ICD-10-CM | POA: Diagnosis not present

## 2014-07-27 DIAGNOSIS — Z7689 Persons encountering health services in other specified circumstances: Secondary | ICD-10-CM

## 2014-07-27 DIAGNOSIS — I4892 Unspecified atrial flutter: Secondary | ICD-10-CM | POA: Diagnosis not present

## 2014-07-27 LAB — POCT INR: INR: 2.2

## 2014-08-08 ENCOUNTER — Encounter: Payer: Self-pay | Admitting: Cardiovascular Disease

## 2014-08-08 ENCOUNTER — Ambulatory Visit (INDEPENDENT_AMBULATORY_CARE_PROVIDER_SITE_OTHER): Payer: Medicare Other | Admitting: Cardiovascular Disease

## 2014-08-08 VITALS — BP 168/82 | HR 59 | Ht 64.0 in | Wt 149.2 lb

## 2014-08-08 DIAGNOSIS — I481 Persistent atrial fibrillation: Secondary | ICD-10-CM

## 2014-08-08 DIAGNOSIS — I4819 Other persistent atrial fibrillation: Secondary | ICD-10-CM

## 2014-08-08 DIAGNOSIS — I10 Essential (primary) hypertension: Secondary | ICD-10-CM | POA: Diagnosis not present

## 2014-08-08 NOTE — Assessment & Plan Note (Signed)
She is maintaining a normal sinus rhythm on diltiazem and metoprolol. I asked her to call us to clarify the dose of diltiazem she is taking. She has been complaining of chronic fatigue over the last year and she thinks it's might be due to warfarin. I explained to her that anticoagulation does not usually cause this kind of symptoms. She does have bradycardia which might be contributing. I might elect to decrease the dose of diltiazem once we clarify the dose.

## 2014-08-08 NOTE — Progress Notes (Signed)
Primary care physician: Dr. Danise Mina.  HPI  This is a pleasant 81 year old female who is here today for a followup visit regarding persistent atrial flutter/fibrillation.  She also had atypical chest pain. Nuclear stress test in 03/2012 showed no evidence of ischemia. Echocardiogram showed normal LV systolic function, mild to moderate aortic and mitral regurgitation, moderate tricuspid regurgitation with mild pulmonary hypertension.   She was switched from Xarelto to warfarin due to cost. She is overall doing better but has been complaining of fatigue and difficulty sleeping at night for a while now .During last visit, I decreased the dose of metoprolol to 25 mg twice daily due to bradycardia. She is supposed to be on diltiazem extended release 240 mg once daily.  Allergies  Allergen Reactions  . Penicillins     REACTION: RASH     Current Outpatient Prescriptions on File Prior to Visit  Medication Sig Dispense Refill  . acyclovir (ZOVIRAX) 400 MG tablet Take 400 mg by mouth daily.      Marland Kitchen atorvastatin (LIPITOR) 20 MG tablet TAKE ONE TABLET EVERY DAY 30 tablet 6  . calcium-vitamin D (OSCAL WITH D) 500-200 MG-UNIT per tablet Take 1 tablet by mouth daily.      . cholecalciferol (VITAMIN D) 1000 UNITS tablet Take 1,000 Units by mouth daily.     . cyanocobalamin ([facility] VITAMIN B12) 1000 MCG tablet Take 1,000 mcg by mouth daily.    Marland Kitchen diltiazem (CARDIZEM CD) 120 MG 24 hr capsule TAKE 1 CAPSULE EVERY DAY 90 capsule 3  . diltiazem (CARDIZEM CD) 240 MG 24 hr capsule Take 1 capsule (240 mg total) by mouth daily. 90 capsule 3  . ferrous sulfate 325 (65 FE) MG tablet Take 1 tablet (325 mg total) by mouth daily with breakfast.    . glucosamine-chondroitin 500-400 MG tablet Take 1 tablet by mouth daily.     . metoprolol tartrate (LOPRESSOR) 25 MG tablet TAKE ONE TABLET BY MOUTH TWICE DAILY 60 tablet 1  . pantoprazole (PROTONIX) 40 MG tablet TAKE ONE TABLET EVERY DAY 30 tablet 5  . prednisoLONE acetate  (PRED FORTE) 1 % ophthalmic suspension 1 drop daily. As directed.    Marland Kitchen rOPINIRole (REQUIP) 1 MG tablet TAKE TWO TABLETS BY MOUTH AT BEDTIME 180 tablet 3  . venlafaxine (EFFEXOR) 37.5 MG tablet TAKE ONE TABLET BY MOUTH EVERY DAY 90 tablet 1  . warfarin (COUMADIN) 5 MG tablet TAKE ONE TABLET BY MOUTH EVERY DAY 30 tablet 3   No current facility-administered medications on file prior to visit.     Past Medical History  Diagnosis Date  . GERD (gastroesophageal reflux disease) 05/1999  . Hyperlipidemia 12/2003  . Hypertension 1990  . Osteoarthritis 08/21/1989  . Osteoporosis with fracture 11/1997    with compression fracture lower thoracic vertebra  . DDD (degenerative disc disease), lumbosacral 12/06/1992  . History of shingles     ophthalmic, takes acyclovir daily  . Cancer     skin cancer  . Paroxysmal atrial flutter 03/16/2012    on coumadin  . RLS (restless legs syndrome)   . BCC (basal cell carcinoma of skin) 05/2011    on nose     Past Surgical History  Procedure Laterality Date  . Abdominal hysterectomy  Age 22 - 67    S/P TAH  . Cervical discectomy  1992    Fusion (Dr. Joya Salm)  . Esophagogastroduodenoscopy  01/01/2002    with ulcer, bx. neg; + stricture gastric ulcer with hemorrhage  . Esophagogastroduodenoscopy  04/23/2004    Esophageal stricture -- dilated  . Abdominal ultrasound  04/23/2004    Gallstones  . Cataract extraction  05/2010    left eye  . Skin cancer excision  05/20/2011    BCC from nose  . US echocardiography  03/2012    in flutter, EF 60%, mild-mod aortic, mitral, tricuspid regurg, mildly dilated LA  . Nuclear stress test  03/2012    no ischemia     Family History  Problem Relation Age of Onset  . Emphysema Mother   . Heart disease Father     MI  . Stroke Father     first at age 66.  . Stroke Sister   . Diabetes Sister   . Hypertension Brother   . Heart disease Brother     Heart Valve  . Stroke Brother   . Diabetes Brother   .  Cancer Brother     esophageal  . Diabetes Brother   . Diabetes Brother      History   Social History  . Marital Status: Married    Spouse Name: N/A  . Number of Children: 5  . Years of Education: N/A   Occupational History  . Retired - [employer]    Social History Main Topics  . Smoking status: Former Smoker -- 0.25 packs/day for 1 years  . Smokeless tobacco: Never Used     Comment: pt smokes occasionally "socially"  . Alcohol Use: No     Comment: wine occassionally  . Drug Use: No  . Sexual Activity: No   Other Topics Concern  . Not on file   Social History Narrative   Lives with grandson who smokes, dog   From [place].  Widowed 01/1999; 1st marriage 25 years / 2nd marriage 13 years.   One son deceased 23-Jan-2023.   Activity: active with dog   Diet: good water, fruits/vegetables daily       PHYSICAL EXAM   BP 168/82 mmHg  Pulse 59  Ht 5\' 4"  (1.626 m)  Wt 149 lb 4 oz (67.699 kg)  BMI 25.61 kg/m2  Constitutional: She is oriented to person, place, and time. She appears well-developed and well-nourished. No distress.  HENT: No nasal discharge.  Head: Normocephalic and atraumatic.  Eyes: Pupils are equal and round. Right eye exhibits no discharge. Left eye exhibits no discharge.  Neck: Normal range of motion. Neck supple. No JVD present. No thyromegaly present.  Cardiovascular: Bradycardic, regular rhythm, normal heart sounds. Exam reveals no gallop and no friction rub. No murmur heard.  Pulmonary/Chest: Effort normal and breath sounds normal. No stridor. No respiratory distress. She has no wheezes. She has no rales. She exhibits no tenderness.  Abdominal: Soft. Bowel sounds are normal. She exhibits no distension. There is no tenderness. There is no rebound and no guarding.  Musculoskeletal: Normal range of motion. She exhibits no edema and no tenderness.  Neurological: She is alert and oriented to person, place, and time. Coordination normal.  Skin: Skin is warm and  dry. No rash noted. She is not diaphoretic. No erythema. No pallor.  Psychiatric: She has a normal mood and affect. Her behavior is normal. Judgment and thought content normal.    EKG: Sinus  Bradycardia  -Nonspecific ST depression  -Nondiagnostic.   ABNORMAL

## 2014-08-08 NOTE — Assessment & Plan Note (Signed)
Her blood pressure is elevated. She is not orthostatic today. We should consider adding an ACE inhibitor or an ARB. She was on hydrochlorothiazide last year.

## 2014-08-08 NOTE — Patient Instructions (Signed)
Call us and let us know the strength of Diltiazem you take.   Your physician wants you to follow-up in: 6 months.  You will receive a reminder letter in the mail two months in advance. If you don't receive a letter, please call our office to schedule the follow-up appointment.

## 2014-08-09 ENCOUNTER — Telehealth: Payer: Self-pay

## 2014-08-09 NOTE — Telephone Encounter (Signed)
Pt states there was a medication listed twice on her list yesterday, and staes she does take the Diltiazem 240 anymore, she is now taking 120 mg.

## 2014-08-09 NOTE — Telephone Encounter (Signed)
Deleted Diltiazem 240mg  from current list of medications.

## 2014-08-31 ENCOUNTER — Ambulatory Visit (INDEPENDENT_AMBULATORY_CARE_PROVIDER_SITE_OTHER): Payer: Medicare Other

## 2014-08-31 DIAGNOSIS — Z0000 Encounter for general adult medical examination without abnormal findings: Secondary | ICD-10-CM | POA: Diagnosis not present

## 2014-08-31 DIAGNOSIS — I4892 Unspecified atrial flutter: Secondary | ICD-10-CM

## 2014-08-31 DIAGNOSIS — Z5181 Encounter for therapeutic drug level monitoring: Secondary | ICD-10-CM

## 2014-08-31 DIAGNOSIS — Z7689 Persons encountering health services in other specified circumstances: Secondary | ICD-10-CM

## 2014-08-31 LAB — POCT INR: INR: 2.4

## 2014-09-23 ENCOUNTER — Other Ambulatory Visit: Payer: Self-pay | Admitting: Family Medicine

## 2014-09-23 ENCOUNTER — Other Ambulatory Visit: Payer: Self-pay | Admitting: Cardiovascular Disease

## 2014-09-23 NOTE — Telephone Encounter (Signed)
Ok to refill 

## 2014-09-23 NOTE — Telephone Encounter (Signed)
Please review for refill. Thanks!  

## 2014-09-28 ENCOUNTER — Ambulatory Visit (INDEPENDENT_AMBULATORY_CARE_PROVIDER_SITE_OTHER): Payer: Medicare Other

## 2014-09-28 DIAGNOSIS — Z5181 Encounter for therapeutic drug level monitoring: Secondary | ICD-10-CM | POA: Diagnosis not present

## 2014-09-28 DIAGNOSIS — I4892 Unspecified atrial flutter: Secondary | ICD-10-CM

## 2014-09-28 LAB — POCT INR: INR: 1.2

## 2014-10-05 ENCOUNTER — Ambulatory Visit (INDEPENDENT_AMBULATORY_CARE_PROVIDER_SITE_OTHER): Payer: Medicare Other

## 2014-10-05 DIAGNOSIS — I4892 Unspecified atrial flutter: Secondary | ICD-10-CM | POA: Diagnosis not present

## 2014-10-05 DIAGNOSIS — Z5181 Encounter for therapeutic drug level monitoring: Secondary | ICD-10-CM | POA: Diagnosis not present

## 2014-10-05 LAB — POCT INR: INR: 2.9

## 2014-10-06 ENCOUNTER — Ambulatory Visit (INDEPENDENT_AMBULATORY_CARE_PROVIDER_SITE_OTHER): Payer: Medicare Other | Admitting: Family Medicine

## 2014-10-06 ENCOUNTER — Encounter: Payer: Self-pay | Admitting: Family Medicine

## 2014-10-06 VITALS — BP 132/74 | HR 96 | Temp 97.6°F | Wt 148.2 lb

## 2014-10-06 DIAGNOSIS — M79605 Pain in left leg: Secondary | ICD-10-CM | POA: Diagnosis not present

## 2014-10-06 DIAGNOSIS — M79604 Pain in right leg: Secondary | ICD-10-CM

## 2014-10-06 NOTE — Patient Instructions (Addendum)
Please schedule wellness visit as you're due. Schedule this in next 1-2 months. Return prior to wellness visit for fasting labwork. Make sure you take metoprolol today. Good to see you today, call us with questions.

## 2014-10-06 NOTE — Progress Notes (Signed)
Pre visit review using our clinic review tool, if applicable. No additional management support is needed unless otherwise documented below in the visit note. 

## 2014-10-06 NOTE — Assessment & Plan Note (Signed)
Not quite consistent with neuropathy, arterial or venous insufficiency. Not consistent with DVT. Previously failed gabapentin, coQ10.  On requip for RLS - continue. Previously thought statin related - unclear if improved with trial off lipitor. Overall normal exam. Denies significant back pain, doubt neurogenic claudication. Pt will return in 1-2 mo for AMW and labwork.

## 2014-10-06 NOTE — Progress Notes (Signed)
BP 132/74 mmHg  Pulse 96  Temp(Src) 97.6 F (36.4 C) (Oral)  Wt 148 lb 4 oz (67.246 kg)   CC: bilateral leg pain  Subjective:    Patient ID: [name], female    DOB: Aug 08, 1929, 81 y.o.   MRN: [id number]  HPI: [name] is a 81 y.o. female presenting on 10/06/2014 for Leg Pain   Leg discomfort off and on for years. Feels "hot on the inside like they're burning". Feet stay cold. Mainly affects ability to sleep. Persistent cramping in legs. Denies paresthesias of feet. No calf pain with walking. Main sxs are when she's laying down. No leg swelling.   No chest pain, dyspnea, palpitations. No headaches. Occasional dizziness. Occasional back ache but nothing significantly bothersome  Doesn't regularly take effexor. + family stressors. Thinking about downsizing her house. Some stress over this.   Lives alone. Drives.   Last seen 10/2013.  Atrial fibrillation chronically on coumadin. Therapeutic at last check.  H/o RLS on requip daily with good effect.  H/o OA and osteoporosis with compression fracture.   Relevant past medical, surgical, family and social history reviewed and updated as indicated. Interim medical history since our last visit reviewed. Allergies and medications reviewed and updated. Current Outpatient Prescriptions on File Prior to Visit  Medication Sig  . acyclovir (ZOVIRAX) 400 MG tablet Take 400 mg by mouth daily.    Marland Kitchen atorvastatin (LIPITOR) 20 MG tablet TAKE ONE TABLET EVERY DAY  . calcium-vitamin D (OSCAL WITH D) 500-200 MG-UNIT per tablet Take 1 tablet by mouth daily.    . cholecalciferol (VITAMIN D) 1000 UNITS tablet Take 1,000 Units by mouth daily.   . cyanocobalamin ([facility]S VITAMIN B12) 1000 MCG tablet Take 1,000 mcg by mouth daily.  Marland Kitchen diltiazem (CARDIZEM CD) 120 MG 24 hr capsule TAKE 1 CAPSULE EVERY DAY  . ferrous sulfate 325 (65 FE) MG tablet Take 1 tablet (325 mg total) by mouth daily with breakfast.  . glucosamine-chondroitin 500-400 MG tablet  Take 1 tablet by mouth daily.   . metoprolol tartrate (LOPRESSOR) 25 MG tablet TAKE ONE TABLET BY MOUTH TWICE DAILY  . pantoprazole (PROTONIX) 40 MG tablet TAKE ONE TABLET EVERY DAY  . prednisoLONE acetate (PRED FORTE) 1 % ophthalmic suspension 1 drop daily. As directed.  Marland Kitchen rOPINIRole (REQUIP) 1 MG tablet TAKE 2 TABLETS BY MOUTH AT BEDTIME  . venlafaxine (EFFEXOR) 37.5 MG tablet TAKE ONE TABLET BY MOUTH EVERY DAY  . warfarin (COUMADIN) 5 MG tablet TAKE ONE TABLET EVERY DAY (Patient taking differently: as directed)   No current facility-administered medications on file prior to visit.    Review of Systems Per HPI unless specifically indicated above     Objective:    BP 132/74 mmHg  Pulse 96  Temp(Src) 97.6 F (36.4 C) (Oral)  Wt 148 lb 4 oz (67.246 kg)  Wt Readings from Last 3 Encounters:  10/06/14 148 lb 4 oz (67.246 kg)  08/08/14 149 lb 4 oz (67.699 kg)  11/26/13 147 lb 8 oz (66.906 kg)    Physical Exam  Constitutional: She appears well-developed and well-nourished. No distress.  Cardiovascular: Normal rate and intact distal pulses.  An irregularly irregular rhythm present.  No murmur heard. Pulmonary/Chest: Effort normal and breath sounds normal. No respiratory distress. She has no wheezes. She has no rales.  Musculoskeletal: She exhibits no edema.  No pedal edema Diminished but 1+ DP bilaterally No palpable cords No significant varicosities  No pain at knees, no deformity or  effusion noted. Sensation intact to monofilament.  Psychiatric: She has a normal mood and affect.  Nursing note and vitals reviewed.  Results for orders placed or performed in visit on 10/05/14  POCT INR  Result Value Ref Range   INR 2.9       Assessment & Plan:   Problem List Items Addressed This Visit    Leg pain, bilateral - Primary    Not quite consistent with neuropathy, arterial or venous insufficiency. Not consistent with DVT. Previously failed gabapentin, coQ10.  On requip for RLS  - continue. Previously thought statin related - unclear if improved with trial off lipitor. Overall normal exam. Denies significant back pain, doubt neurogenic claudication. Pt will return in 1-2 mo for AMW and labwork.          Follow up plan: Return in about 2 months (around 12/06/2014), or as needed, for medicare wellness visit.

## 2014-10-07 ENCOUNTER — Emergency Department: Payer: Medicare Other

## 2014-10-07 ENCOUNTER — Emergency Department
Admission: EM | Admit: 2014-10-07 | Discharge: 2014-10-08 | Disposition: A | Discharge status: Home / Self Care | Payer: Medicare Other | Attending: Emergency Medicine | Admitting: Emergency Medicine

## 2014-10-07 ENCOUNTER — Encounter: Payer: Self-pay | Admitting: Emergency Medicine

## 2014-10-07 DIAGNOSIS — S90511A Abrasion, right ankle, initial encounter: Secondary | ICD-10-CM | POA: Diagnosis not present

## 2014-10-07 DIAGNOSIS — S8781XA Crushing injury of right lower leg, initial encounter: Secondary | ICD-10-CM

## 2014-10-07 DIAGNOSIS — M7989 Other specified soft tissue disorders: Secondary | ICD-10-CM | POA: Diagnosis not present

## 2014-10-07 DIAGNOSIS — Y9289 Other specified places as the place of occurrence of the external cause: Secondary | ICD-10-CM | POA: Diagnosis not present

## 2014-10-07 DIAGNOSIS — S8011XA Contusion of right lower leg, initial encounter: Secondary | ICD-10-CM | POA: Diagnosis not present

## 2014-10-07 DIAGNOSIS — Z87891 Personal history of nicotine dependence: Secondary | ICD-10-CM | POA: Diagnosis not present

## 2014-10-07 DIAGNOSIS — Y9389 Activity, other specified: Secondary | ICD-10-CM | POA: Diagnosis not present

## 2014-10-07 DIAGNOSIS — Y998 Other external cause status: Secondary | ICD-10-CM | POA: Diagnosis not present

## 2014-10-07 DIAGNOSIS — S90512A Abrasion, left ankle, initial encounter: Secondary | ICD-10-CM | POA: Diagnosis not present

## 2014-10-07 DIAGNOSIS — S8991XA Unspecified injury of right lower leg, initial encounter: Secondary | ICD-10-CM | POA: Diagnosis not present

## 2014-10-07 LAB — BASIC METABOLIC PANEL
ANION GAP: 11 (ref 5–15)
BUN: 16 mg/dL (ref 6–20)
CALCIUM: 9.3 mg/dL (ref 8.9–10.3)
CHLORIDE: 102 mmol/L (ref 101–111)
CO2: 26 mmol/L (ref 22–32)
Creatinine, Ser: 0.99 mg/dL (ref 0.44–1.00)
GFR calc Af Amer: 59 mL/min — ABNORMAL LOW (ref >60)
GFR calc non Af Amer: 51 mL/min — ABNORMAL LOW (ref >60)
GLUCOSE: 97 mg/dL (ref 65–99)
Potassium: 3.6 mmol/L (ref 3.5–5.1)
Sodium: 139 mmol/L (ref 135–145)

## 2014-10-07 LAB — CBC
HCT: 38.3 % (ref 35.0–47.0)
Hemoglobin: 12.9 g/dL (ref 12.0–16.0)
MCH: 31.1 pg (ref 26.0–34.0)
MCHC: 33.8 g/dL (ref 32.0–36.0)
MCV: 92 fL (ref 80.0–100.0)
PLATELETS: 218 10*3/uL (ref 150–440)
RBC: 4.16 MIL/uL (ref 3.80–5.20)
RDW: 13.5 % (ref 11.5–14.5)
WBC: 6.6 10*3/uL (ref 3.6–11.0)

## 2014-10-07 LAB — PROTIME-INR
INR: 2.83
Prothrombin Time: 29.8 seconds — ABNORMAL HIGH (ref 11.4–15.0)

## 2014-10-07 MED ORDER — ONDANSETRON HCL 4 MG/2ML IJ SOLN
4.0000 mg | INTRAMUSCULAR | Status: AC
  Administered 2014-10-07: 4 mg via INTRAVENOUS

## 2014-10-07 MED ORDER — HYDROCODONE-ACETAMINOPHEN 5-325 MG PO TABS
2.0000 | ORAL_TABLET | ORAL | Status: AC
  Administered 2014-10-07: 2 via ORAL

## 2014-10-07 MED ORDER — MORPHINE SULFATE 4 MG/ML IJ SOLN
4.0000 mg | Freq: Once | INTRAMUSCULAR | Status: AC
  Administered 2014-10-07: 4 mg via INTRAVENOUS

## 2014-10-07 MED ORDER — HYDROCODONE-ACETAMINOPHEN 5-325 MG PO TABS
ORAL_TABLET | ORAL | Status: AC
  Administered 2014-10-07: 2 via ORAL
  Filled 2014-10-07: qty 2

## 2014-10-07 MED ORDER — HYDROCODONE-ACETAMINOPHEN 5-325 MG PO TABS: 1.0000 | ORAL_TABLET | ORAL | Status: DC | PRN

## 2014-10-07 MED ORDER — MORPHINE SULFATE 4 MG/ML IJ SOLN
INTRAMUSCULAR | Status: AC
  Administered 2014-10-07: 4 mg via INTRAVENOUS
  Filled 2014-10-07: qty 1

## 2014-10-07 MED ORDER — SODIUM CHLORIDE 0.9 % IV BOLUS (SEPSIS)
500.0000 mL | INTRAVENOUS | Status: AC
  Administered 2014-10-07: 500 mL via INTRAVENOUS

## 2014-10-07 MED ORDER — ONDANSETRON HCL 4 MG/2ML IJ SOLN
INTRAMUSCULAR | Status: AC
  Administered 2014-10-07: 4 mg via INTRAVENOUS
  Filled 2014-10-07: qty 2
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
## 2014-10-07 NOTE — ED Notes (Signed)
PMS intact, dp and pt puses present, sensation & motor intact.  Cap refill < 3 sec
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
## 2014-10-07 NOTE — ED Notes (Addendum)
PMS recheck: DP and PT pulses present with doppler, sensation and motor intact in RLE

## 2014-10-07 NOTE — ED Notes (Signed)
Rechecked PMS on RLE.  DP and PT pulses present with doppler, motor and sensation intact as well.  Cap refill < 3 seconds.  Pain decreased from 10/10 to a 8/10 after morphine administration.

## 2014-10-07 NOTE — ED Notes (Signed)
Patient transported to X-ray 

## 2014-10-08 NOTE — ED Notes (Signed)
Patient with no complaints at this time. Respirations even and unlabored. Skin warm/dry. Discharge instructions reviewed with patient at this time. Patient given opportunity to voice concerns/ask questions. IV removed per policy and band-aid applied to site. Patient discharged at this time and left Emergency Department, via wheelchair.   

## 2014-10-09 ENCOUNTER — Emergency Department (HOSPITAL_COMMUNITY)
Admission: EM | Admit: 2014-10-09 | Discharge: 2014-10-09 | Disposition: A | Discharge status: Home / Self Care | Payer: Medicare Other | Attending: Emergency Medicine | Admitting: Emergency Medicine

## 2014-10-09 ENCOUNTER — Emergency Department (HOSPITAL_COMMUNITY): Payer: Medicare Other

## 2014-10-09 ENCOUNTER — Encounter (HOSPITAL_COMMUNITY): Payer: Self-pay | Admitting: Adult Health

## 2014-10-09 DIAGNOSIS — R791 Abnormal coagulation profile: Secondary | ICD-10-CM | POA: Diagnosis not present

## 2014-10-09 DIAGNOSIS — S8011XA Contusion of right lower leg, initial encounter: Secondary | ICD-10-CM

## 2014-10-09 DIAGNOSIS — Z7901 Long term (current) use of anticoagulants: Secondary | ICD-10-CM | POA: Diagnosis not present

## 2014-10-09 DIAGNOSIS — S8011XD Contusion of right lower leg, subsequent encounter: Secondary | ICD-10-CM | POA: Diagnosis not present

## 2014-10-09 DIAGNOSIS — G2581 Restless legs syndrome: Secondary | ICD-10-CM | POA: Diagnosis not present

## 2014-10-09 DIAGNOSIS — Z79899 Other long term (current) drug therapy: Secondary | ICD-10-CM | POA: Diagnosis not present

## 2014-10-09 DIAGNOSIS — Z85828 Personal history of other malignant neoplasm of skin: Secondary | ICD-10-CM | POA: Insufficient documentation

## 2014-10-09 DIAGNOSIS — I10 Essential (primary) hypertension: Secondary | ICD-10-CM | POA: Insufficient documentation

## 2014-10-09 DIAGNOSIS — E785 Hyperlipidemia, unspecified: Secondary | ICD-10-CM | POA: Insufficient documentation

## 2014-10-09 DIAGNOSIS — Z7952 Long term (current) use of systemic steroids: Secondary | ICD-10-CM | POA: Insufficient documentation

## 2014-10-09 DIAGNOSIS — K219 Gastro-esophageal reflux disease without esophagitis: Secondary | ICD-10-CM | POA: Diagnosis not present

## 2014-10-09 DIAGNOSIS — S80821A Blister (nonthermal), right lower leg, initial encounter: Secondary | ICD-10-CM | POA: Diagnosis not present

## 2014-10-09 DIAGNOSIS — Z87891 Personal history of nicotine dependence: Secondary | ICD-10-CM | POA: Diagnosis not present

## 2014-10-09 DIAGNOSIS — Z8619 Personal history of other infectious and parasitic diseases: Secondary | ICD-10-CM | POA: Insufficient documentation

## 2014-10-09 DIAGNOSIS — S8991XD Unspecified injury of right lower leg, subsequent encounter: Secondary | ICD-10-CM | POA: Diagnosis present

## 2014-10-09 DIAGNOSIS — M1990 Unspecified osteoarthritis, unspecified site: Secondary | ICD-10-CM | POA: Insufficient documentation

## 2014-10-09 DIAGNOSIS — I4892 Unspecified atrial flutter: Secondary | ICD-10-CM | POA: Diagnosis not present

## 2014-10-09 LAB — CBC WITH DIFFERENTIAL/PLATELET
BASOS ABS: 0 10*3/uL (ref 0.0–0.1)
Basophils Relative: 1 % (ref 0–1)
EOS ABS: 0.2 10*3/uL (ref 0.0–0.7)
Eosinophils Relative: 3 % (ref 0–5)
HEMATOCRIT: 30.8 % — AB (ref 36.0–46.0)
Hemoglobin: 10.3 g/dL — ABNORMAL LOW (ref 12.0–15.0)
LYMPHS PCT: 27 % (ref 12–46)
Lymphs Abs: 1.5 10*3/uL (ref 0.7–4.0)
MCH: 30.7 pg (ref 26.0–34.0)
MCHC: 33.4 g/dL (ref 30.0–36.0)
MCV: 91.7 fL (ref 78.0–100.0)
MONO ABS: 0.5 10*3/uL (ref 0.1–1.0)
MONOS PCT: 10 % (ref 3–12)
Neutro Abs: 3.3 10*3/uL (ref 1.7–7.7)
Neutrophils Relative %: 59 % (ref 43–77)
Platelets: 186 10*3/uL (ref 150–400)
RBC: 3.36 MIL/uL — AB (ref 3.87–5.11)
RDW: 13.4 % (ref 11.5–15.5)
WBC: 5.5 10*3/uL (ref 4.0–10.5)

## 2014-10-09 LAB — BASIC METABOLIC PANEL
ANION GAP: 9 (ref 5–15)
BUN: 13 mg/dL (ref 6–20)
CO2: 29 mmol/L (ref 22–32)
Calcium: 8.7 mg/dL — ABNORMAL LOW (ref 8.9–10.3)
Chloride: 100 mmol/L — ABNORMAL LOW (ref 101–111)
Creatinine, Ser: 1.03 mg/dL — ABNORMAL HIGH (ref 0.44–1.00)
GFR calc Af Amer: 57 mL/min — ABNORMAL LOW (ref >60)
GFR, EST NON AFRICAN AMERICAN: 49 mL/min — AB (ref >60)
GLUCOSE: 104 mg/dL — AB (ref 65–99)
Potassium: 3.3 mmol/L — ABNORMAL LOW (ref 3.5–5.1)
Sodium: 138 mmol/L (ref 135–145)

## 2014-10-09 LAB — PROTIME-INR
INR: 3.59 — ABNORMAL HIGH (ref 0.00–1.49)
PROTHROMBIN TIME: 35 s — AB (ref 11.6–15.2)
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
## 2014-10-09 NOTE — Discharge Instructions (Signed)
Your INR was 3.6 today.  Do not take your next two coumadin doses.  Keep your leg elevated to decrease swelling.  Apply a gauze dressing to your blisters once daily.  Get rechecked immediately if you develop any new or worrisome symptoms.     Hematoma A hematoma is a collection of blood under the skin, in an organ, in a body space, in a joint space, or in other tissue. The blood can clot to form a lump that you can see and feel. The lump is often firm and may sometimes become sore and tender. Most hematomas get better in a few days to weeks. However, some hematomas may be serious and require medical care. Hematomas can range in size from very small to very large. CAUSES  A hematoma can be caused by a blunt or penetrating injury. It can also be caused by spontaneous leakage from a blood vessel under the skin. Spontaneous leakage from a blood vessel is more likely to occur in older people, especially those taking blood thinners. Sometimes, a hematoma can develop after certain medical procedures. SIGNS AND SYMPTOMS   A firm lump on the body.  Possible pain and tenderness in the area.  Bruising.Blue, dark blue, purple-red, or yellowish skin may appear at the site of the hematoma if the hematoma is close to the surface of the skin. For hematomas in deeper tissues or body spaces, the signs and symptoms may be subtle. For example, an intra-abdominal hematoma may cause abdominal pain, weakness, fainting, and shortness of breath. An intracranial hematoma may cause a headache or symptoms such as weakness, trouble speaking, or a change in consciousness. DIAGNOSIS  A hematoma can usually be diagnosed based on your medical history and a physical exam. Imaging tests may be needed if your health care provider suspects a hematoma in deeper tissues or body spaces, such as the abdomen, head, or chest. These tests may include ultrasonography or a CT scan.  TREATMENT  Hematomas usually go away on their own over time.  Rarely does the blood need to be drained out of the body. Large hematomas or those that may affect vital organs will sometimes need surgical drainage or monitoring. HOME CARE INSTRUCTIONS   Apply ice to the injured area:   Put ice in a plastic bag.   Place a towel between your skin and the bag.   Leave the ice on for 20 minutes, 2-3 times a day for the first 1 to 2 days.   After the first 2 days, switch to using warm compresses on the hematoma.   Elevate the injured area to help decrease pain and swelling. Wrapping the area with an elastic bandage may also be helpful. Compression helps to reduce swelling and promotes shrinking of the hematoma. Make sure the bandage is not wrapped too tight.   If your hematoma is on a lower extremity and is painful, crutches may be helpful for a couple days.   Only take over-the-counter or prescription medicines as directed by your health care provider. SEEK IMMEDIATE MEDICAL CARE IF:   You have increasing pain, or your pain is not controlled with medicine.   You have a fever.   You have worsening swelling or discoloration.   Your skin over the hematoma breaks or starts bleeding.   Your hematoma is in your chest or abdomen and you have weakness, shortness of breath, or a change in consciousness.  Your hematoma is on your scalp (caused by a fall or injury) and you  have a worsening headache or a change in alertness or consciousness. MAKE SURE YOU:   Understand these instructions.  Will watch your condition.  Will get help right away if you are not doing well or get worse. Document Released: 11/07/2003 Document Revised: 11/25/2012 Document Reviewed: 09/02/2012 [facility] Patient Information 2015 [place], [place]. This information is not intended to replace advice given to you by your health care provider. Make sure you discuss any questions you have with your health care provider.
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
## 2014-10-09 NOTE — ED Notes (Signed)
Blisters wrapped with xeroform, gauze, and kerlex

## 2014-10-12 ENCOUNTER — Ambulatory Visit (INDEPENDENT_AMBULATORY_CARE_PROVIDER_SITE_OTHER): Payer: Medicare Other | Admitting: *Deleted

## 2014-10-12 DIAGNOSIS — I4892 Unspecified atrial flutter: Secondary | ICD-10-CM | POA: Diagnosis not present

## 2014-10-12 DIAGNOSIS — Z5181 Encounter for therapeutic drug level monitoring: Secondary | ICD-10-CM | POA: Diagnosis not present

## 2014-10-12 LAB — POCT INR: INR: 1.8

## 2014-10-13 ENCOUNTER — Encounter: Payer: Self-pay | Admitting: Family Medicine

## 2014-10-13 ENCOUNTER — Ambulatory Visit (INDEPENDENT_AMBULATORY_CARE_PROVIDER_SITE_OTHER): Payer: Medicare Other | Admitting: Family Medicine

## 2014-10-13 VITALS — BP 136/62 | HR 68 | Temp 97.8°F

## 2014-10-13 DIAGNOSIS — G8929 Other chronic pain: Secondary | ICD-10-CM | POA: Insufficient documentation

## 2014-10-13 DIAGNOSIS — S8781XD Crushing injury of right lower leg, subsequent encounter: Secondary | ICD-10-CM

## 2014-10-13 DIAGNOSIS — M79606 Pain in leg, unspecified: Secondary | ICD-10-CM

## 2014-10-13 MED ORDER — HYDROCODONE-ACETAMINOPHEN 5-325 MG PO TABS: 1.0000 | ORAL_TABLET | Freq: Three times a day (TID) | ORAL | Status: DC | PRN

## 2014-10-13 MED ORDER — CEPHALEXIN 250 MG PO CAPS: 250.0000 mg | ORAL_CAPSULE | Freq: Four times a day (QID) | ORAL | Status: DC
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
## 2014-10-13 NOTE — Patient Instructions (Addendum)
Start keflex antibiotic - watch for rash and let us know if this happens. Continue daily wraps. Keep appointment with coumadin clinic.  For pain, start with tylenol 500mg  three times daily. If uncontrolled pain, may add hydrocodone pill up to three times daily.

## 2014-10-13 NOTE — Assessment & Plan Note (Addendum)
Now complicated with large blood blister developing. Did not drain today.  Again not consistent with compartment syndrome today. Continue dressing changes daily, continue elevation of legs. Rec f/u with wound clinic on scheduled appt Tuesday. Given mild erythema surrounding ankle abrasion will cover for cellulitis with keflex 250mg  QID 1wk course, doubt will have trouble despite PCN allergy (rash) - discussed this. Discussed pain regimen - start with tylenol 500mg  TID, add hydrocodone for breakthrough pain. Refilled hydrocodone today. Rec against NSAIDs 2/2 coumadin use. Pt agrees with plan, questions answered today.

## 2014-10-13 NOTE — Progress Notes (Signed)
Pre visit review using our clinic review tool, if applicable. No additional management support is needed unless otherwise documented below in the visit note. 

## 2014-10-18 ENCOUNTER — Encounter: Payer: Medicare Other | Attending: Surgery | Admitting: Surgery

## 2014-10-18 DIAGNOSIS — Z7901 Long term (current) use of anticoagulants: Secondary | ICD-10-CM | POA: Insufficient documentation

## 2014-10-18 DIAGNOSIS — Z87891 Personal history of nicotine dependence: Secondary | ICD-10-CM | POA: Diagnosis not present

## 2014-10-18 DIAGNOSIS — I4891 Unspecified atrial fibrillation: Secondary | ICD-10-CM | POA: Insufficient documentation

## 2014-10-18 DIAGNOSIS — K219 Gastro-esophageal reflux disease without esophagitis: Secondary | ICD-10-CM | POA: Insufficient documentation

## 2014-10-18 DIAGNOSIS — S8781XA Crushing injury of right lower leg, initial encounter: Secondary | ICD-10-CM | POA: Insufficient documentation

## 2014-10-18 DIAGNOSIS — S80811A Abrasion, right lower leg, initial encounter: Secondary | ICD-10-CM | POA: Insufficient documentation

## 2014-10-18 DIAGNOSIS — W19XXXA Unspecified fall, initial encounter: Secondary | ICD-10-CM | POA: Diagnosis not present

## 2014-10-18 DIAGNOSIS — I10 Essential (primary) hypertension: Secondary | ICD-10-CM | POA: Diagnosis not present

## 2014-10-19 ENCOUNTER — Ambulatory Visit (INDEPENDENT_AMBULATORY_CARE_PROVIDER_SITE_OTHER): Payer: Medicare Other | Admitting: *Deleted

## 2014-10-19 DIAGNOSIS — Z5181 Encounter for therapeutic drug level monitoring: Secondary | ICD-10-CM | POA: Diagnosis not present

## 2014-10-19 DIAGNOSIS — I4892 Unspecified atrial flutter: Secondary | ICD-10-CM

## 2014-10-19 LAB — POCT INR: INR: 2.4
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
## 2014-10-19 NOTE — Progress Notes (Signed)
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 in Treatment: 0 Diagnosis Coding ICD-10 Codes Code Description S87.81XA Crushing injury of right lower leg, initial encounter S80.811A Abrasion, right lower leg, initial encounter Z79.01 Long term (current) use of anticoagulants Facility Procedures CPT4 Code: 75102585 Description: 99213 - WOUND CARE VISIT-LEV 3 EST PT Modifier: Quantity: 1 CPT4 Code: 27782423 Description: 53614 - DEBRIDE WOUND 1ST 20 SQ CM OR < ICD-10 Description Diagnosis S87.81XA Crushing injury of right lower leg, initial encounte S80.811A Abrasion, right lower leg, initial encounter Z79.01 Long term (current) use of anticoagulants Modifier: r Quantity: 1 Physician Procedures CPT4 Code: 4315400 Description: 99204 - WC PHYS LEVEL 4 - NEW PT ICD-10 Description Diagnosis S87.81XA Crushing injury of right lower leg, initial encounte S80.811A Abrasion, right lower leg, initial encounter Z79.01 Long term  (current) use of anticoagulants Modifier: r Quantity: 1 CPT4 Code: 8676195 Carrender, Clyda Description: 97597 - WC PHYS DEBR WO ANESTH 20 SQ CM ICD-10 Description Diagnosis S87.81XA Crushing injury of right lower  leg, initial encounte S80.811A Abrasion, right lower leg, initial encounter Z79.01 Long term (current) use of anticoagulants S.  ([id number]) Modifier: r Quantity: 1 Electronic Signature(s) Signed: 10/18/2014 1:29:26 PM By: Montey Hora Signed: 10/18/2014 4:00:02 PM By: Christin Fudge MD, FACS Previous Signature: 10/18/2014 10:33:43 AM Version By: Christin Fudge MD, FACS Entered By: Montey Hora on 10/18/2014 13:29:25

## 2014-10-19 NOTE — Progress Notes (Signed)
LENDY, DITTRICH ([id number]) Visit Report for 10/18/2014 Allergy List Details Patient Name: [name], [name]. Date of Service: 10/18/2014 9:00 AM Medical Record Number: [id number] Patient Account Number: [id number] Date of Birth/Sex: 10/23/1929 (81 y.o. Female) Treating RN: Cornell Barman Primary Care Physician: Ria Bush Other Clinician: Referring Physician: Quintella Reichert Treating Physician/Extender: Frann Rider in Treatment: 0 Allergies Active Allergies penicillin Allergy Notes Electronic Signature(s) Signed: 10/18/2014 2:36:54 PM By: Gretta Cool, RN, BSN, Kim RN, BSN Entered By: Gretta Cool, RN, BSN, Kim on 10/18/2014 09:34:55 [name] ([id number]) -------------------------------------------------------------------------------- Arrival Information Details Patient Name: [name] Date of Service: 10/18/2014 9:00 AM Medical Record Number: [id number] Patient Account Number: [id number] Date of Birth/Sex: 22-Jan-1930 (81 y.o. Female) Treating RN: Cornell Barman Primary Care Physician: Ria Bush Other Clinician: Referring Physician: Quintella Reichert Treating Physician/Extender: Frann Rider in Treatment: 0 Visit Information Patient Arrived: Wheel Chair Arrival Time: 09:29 Accompanied By: daughter, [name] Transfer Assistance: Manual Patient Identification Verified: Yes Secondary Verification Process Yes Completed: Patient Has Alerts: Yes Patient Alerts: Patient on Blood Thinner ABI L 0.89 Electronic Signature(s) Signed: 10/18/2014 5:27:11 PM By: Montey Hora Entered By: Montey Hora on 10/18/2014 10:06:37 [name] ([id number]) -------------------------------------------------------------------------------- Clinic Level of Care Assessment Details Patient Name: [name]. Date of Service: 10/18/2014 9:00 AM Medical Record Number: [id number] Patient Account Number: [id number] Date of Birth/Sex: [birth year] (81 y.o.  Female) Treating RN: Montey Hora Primary Care Physician: Ria Bush Other Clinician: Referring Physician: Quintella Reichert Treating Physician/Extender: Frann Rider in Treatment: 0 Clinic Level of Care Assessment Items TOOL 1 Quantity Score []  - Use when EandM and Procedure is performed on INITIAL visit 0 ASSESSMENTS - Nursing Assessment / Reassessment X - General Physical Exam (combine w/ comprehensive assessment (listed just 1 20 below) when performed on new pt. evals) X - Comprehensive Assessment (HX, ROS, Risk Assessments, Wounds Hx, etc.) 1 25 ASSESSMENTS - Wound and Skin Assessment / Reassessment []  - Dermatologic / Skin Assessment (not related to wound area) 0 ASSESSMENTS - Ostomy and/or Continence Assessment and Care []  - Incontinence Assessment and Management 0 []  - Ostomy Care Assessment and Management (repouching, etc.) 0 PROCESS - Coordination of Care X - Simple Patient / Family Education for ongoing care 1 15 []  - Complex (extensive) Patient / Family Education for ongoing care 0 X - Staff obtains Programmer, systems, Records, Test Results / Process Orders 1 10 []  - Staff telephones HHA, Nursing Homes / Clarify orders / etc 0 []  - Routine Transfer to another Facility (non-emergent condition) 0 []  - Routine Hospital Admission (non-emergent condition) 0 X - New Admissions / Biomedical engineer / Ordering NPWT, Apligraf, etc. 1 15 []  - Emergency Hospital Admission (emergent condition) 0 PROCESS - Special Needs []  - Pediatric / Minor Patient Management 0 []  - Isolation Patient Management 0 TELETHA, PETREA. ([id number]) []  - Hearing / Language / Visual special needs 0 []  - Assessment of Community assistance (transportation, D/C planning, etc.) 0 []  - Additional assistance / Altered mentation 0 []  - Support Surface(s) Assessment (bed, cushion, seat, etc.) 0 INTERVENTIONS - Miscellaneous []  - External ear exam 0 []  - Patient Transfer (multiple staff / Civil Service fast streamer  / Similar devices) 0 []  - Simple Staple / Suture removal (25 or less) 0 []  - Complex Staple / Suture removal (26 or more) 0 []  - Hypo/Hyperglycemic Management (do not check if billed separately) 0 X - Ankle / Brachial Index (ABI) - do not check if billed separately 1 15 Has the patient been  seen at the hospital within the last three years: Yes Total Score: 100 Level Of Care: New/Established - Level 3 Electronic Signature(s) Signed: 10/18/2014 1:29:13 PM By: Montey Hora Entered By: Montey Hora on 10/18/2014 13:29:13 [name] ([id number]) -------------------------------------------------------------------------------- Encounter Discharge Information Details Patient Name: [name]. Date of Service: 10/18/2014 9:00 AM Medical Record Number: [id number] Patient Account Number: [id number] Date of Birth/Sex: June 24, 1929 (81 y.o. Female) Treating RN: Montey Hora Primary Care Physician: Ria Bush Other Clinician: Referring Physician: Quintella Reichert Treating Physician/Extender: Frann Rider in Treatment: 0 Encounter Discharge Information Items Discharge Pain Level: 0 Discharge Condition: Stable Ambulatory Status: Wheelchair Discharge Destination: Home Private Transportation: Auto Accompanied By: dtr Schedule Follow-up Appointment: Yes Medication Reconciliation completed and No provided to Patient/Care Nakaiya Beddow: Clinical Summary of Care: Electronic Signature(s) Signed: 10/18/2014 5:27:11 PM By: Montey Hora Entered By: Montey Hora on 10/18/2014 10:27:45 [name] ([id number]) -------------------------------------------------------------------------------- Lower Extremity Assessment Details Patient Name: [name]. Date of Service: 10/18/2014 9:00 AM Medical Record Number: [id number] Patient Account Number: [id number] Date of Birth/Sex: 16-Jul-1929 (81 y.o. Female) Treating RN: Montey Hora Primary Care Physician: Ria Bush Other Clinician: Referring Physician: Quintella Reichert Treating Physician/Extender: Frann Rider in Treatment: 0 Edema Assessment Assessed: [Left: No] [Right: No] Edema: [Left: No] [Right: Yes] Calf Left: Right: Point of Measurement: 31 cm From Medial Instep 34.2 cm 34 cm Ankle Left: Right: Point of Measurement: 10 cm From Medial Instep 17.5 cm 20.5 cm Vascular Assessment Pulses: Posterior Tibial Palpable: [Left:Yes] [Right:No] Dorsalis Pedis Palpable: [Left:Yes] [Right:Yes] Extremity colors, hair growth, and conditions: Extremity Color: [Left:Normal] [Right:Mottled] Hair Growth on Extremity: [Left:Yes] [Right:Yes] Temperature of Extremity: [Left:Warm] [Right:Warm] Capillary Refill: [Left:< 3 seconds] [Right:< 3 seconds] Blood Pressure: Brachial: [Left:135] Dorsalis Pedis: 120 [Left:Dorsalis Pedis:] Ankle: Posterior Tibial: 118 [Left:Posterior Tibial: 0.89] Toe Nail Assessment Left: Right: Thick: No No Discolored: No No Deformed: No No Improper Length and Hygiene: No No Electronic Signature(s) LILLYONA, POLASEK ([id number]) Signed: 10/18/2014 5:27:11 PM By: Montey Hora Entered By: Montey Hora on 10/18/2014 10:05:10 [name] ([id number]) -------------------------------------------------------------------------------- Multi Wound Chart Details Patient Name: [name]. Date of Service: 10/18/2014 9:00 AM Medical Record Number: [id number] Patient Account Number: [id number] Date of Birth/Sex: 1929-06-04 (81 y.o. Female) Treating RN: Cornell Barman Primary Care Physician: Ria Bush Other Clinician: Referring Physician: Quintella Reichert Treating Physician/Extender: Frann Rider in Treatment: 0 Vital Signs Height(in): 64 Pulse(bpm): 93 Weight(lbs): 146 Blood Pressure 135/89 (mmHg): Body Mass Index(BMI): 25 Temperature(F): 97.9 Respiratory Rate 18 (breaths/min): Wound Assessments Treatment Notes Electronic  Signature(s) Signed: 10/18/2014 2:36:54 PM By: Gretta Cool, RN, BSN, Kim RN, BSN Entered By: Gretta Cool, RN, BSN, Kim on 10/18/2014 09:48:18 [name] ([id number]) -------------------------------------------------------------------------------- [place] Details Patient Name: REGINNA, SERMENO. Date of Service: 10/18/2014 9:00 AM Medical Record Number: [id number] Patient Account Number: [id number] Date of Birth/Sex: [birth year] (81 y.o. Female) Treating RN: Cornell Barman Primary Care Physician: Ria Bush Other Clinician: Referring Physician: Quintella Reichert Treating Physician/Extender: Frann Rider in Treatment: 0 Active Inactive Abuse / Safety / Falls / Self Care Management Nursing Diagnoses: Potential for falls Goals: Patient will remain injury free Date Initiated: 10/18/2014 Goal Status: Active Interventions: Assess fall risk on admission and as needed Notes: Orientation to the Wound Care Program Nursing Diagnoses: Knowledge deficit related to the wound healing center program Goals: Patient/caregiver will verbalize understanding of the [place] [facility] Date Initiated: 10/18/2014 Goal Status: Active Interventions: Provide education on orientation to the wound center Notes: Wound/Skin Impairment Nursing Diagnoses:  Impaired tissue integrity Goals: Ulcer/skin breakdown will heal within 14 weeks Date Initiated: 10/18/2014 MORELIA, CASSELLS ([id number]) Goal Status: Active Interventions: Assess ulceration(s) every visit Notes: Electronic Signature(s) Signed: 10/18/2014 2:36:54 PM By: Gretta Cool, RN, BSN, Kim RN, BSN Entered By: Gretta Cool, RN, BSN, Kim on 10/18/2014 09:47:59 [name] ([id number]) -------------------------------------------------------------------------------- Pain Assessment Details Patient Name: HADLEY, SOILEAU. Date of Service: 10/18/2014 9:00 AM Medical Record Number: [id number] Patient Account Number: [id number] Date  of Birth/Sex: Jan 05, 1930 (81 y.o. Female) Treating RN: Cornell Barman Primary Care Physician: Ria Bush Other Clinician: Referring Physician: Quintella Reichert Treating Physician/Extender: Frann Rider in Treatment: 0 Active Problems Location of Pain Severity and Description of Pain Patient Has Paino No Site Locations Pain Management and Medication Current Pain Management: Electronic Signature(s) Signed: 10/18/2014 2:36:54 PM By: Gretta Cool, RN, BSN, Kim RN, BSN Entered By: Gretta Cool, RN, BSN, Kim on 10/18/2014 09:30:59 [name] ([id number]) -------------------------------------------------------------------------------- Patient/Caregiver Education Details Patient Name: [name] Date of Service: 10/18/2014 9:00 AM Medical Record Number: [id number] Patient Account Number: [id number] Date of Birth/Gender: 08-06-1929 (81 y.o. Female) Treating RN: Montey Hora Primary Care Physician: Ria Bush Other Clinician: Referring Physician: Quintella Reichert Treating Physician/Extender: Frann Rider in Treatment: 0 Education Assessment Education Provided To: Patient and Caregiver Education Topics Provided Wound/Skin Impairment: Handouts: Other: wound care as ordered Methods: Demonstration, Explain/Verbal Responses: State content correctly Electronic Signature(s) Signed: 10/18/2014 5:27:11 PM By: Montey Hora Entered By: Montey Hora on 10/18/2014 10:28:01 [name] ([id number]) -------------------------------------------------------------------------------- Wound Assessment Details Patient Name: [name]. Date of Service: 10/18/2014 9:00 AM Medical Record Number: [id number] Patient Account Number: [id number] Date of Birth/Sex: 1929-09-21 (81 y.o. Female) Treating RN: Montey Hora Primary Care Physician: Ria Bush Other Clinician: Referring Physician: Quintella Reichert Treating Physician/Extender: Frann Rider in  Treatment: 0 Wound Status Wound Number: 1 Primary Etiology: Trauma, Other Wound Location: Right Lower Leg Wound Status: Open Wounding Event: Trauma Comorbid History: Hypertension, Osteoarthritis Date Acquired: 10/07/2014 Weeks Of Treatment: 0 Clustered Wound: No Photos Photo Uploaded By: Gretta Cool, RN, BSN, Kim on 10/18/2014 13:05:03 Wound Measurements Length: (cm) 13.2 Width: (cm) 5.5 Depth: (cm) 0.1 Area: (cm) 57.02 Volume: (cm) 5.702 % Reduction in Area: 0% % Reduction in Volume: 0% Epithelialization: Large (67-100%) Tunneling: No Undermining: No Wound Description Classification: Partial Thickness Wound Margin: Flat and Intact Exudate Amount: None Present Foul Odor After Cleansing: No Wound Bed Granulation Amount: None Present (0%) Exposed Structure Necrotic Amount: None Present (0%) Fascia Exposed: No Fat Layer Exposed: No Tendon Exposed: No Muscle Exposed: No Joint Exposed: No Koffler, Anthea S. ([id number]) Bone Exposed: No Limited to Skin Breakdown Periwound Skin Texture Texture Color No Abnormalities Noted: No No Abnormalities Noted: No Callus: No Atrophie Blanche: No Crepitus: No Cyanosis: No Excoriation: No Ecchymosis: No Fluctuance: No Erythema: No Friable: No Hemosiderin Staining: No Induration: No Mottled: No Localized Edema: No Pallor: No Rash: No Rubor: No Scarring: No Temperature / Pain Moisture Temperature: No Abnormality No Abnormalities Noted: No Dry / Scaly: No Maceration: No Moist: No Wound Preparation Ulcer Cleansing: Rinsed/Irrigated with Saline Topical Anesthetic Applied: Other: lidocaine 4%, Assessment Notes wound is 4 blisters Treatment Notes Wound #1 (Right Lower Leg) 1. Cleansed with: Clean wound with Normal Saline 2. Anesthetic Topical Lidocaine 4% cream to wound bed prior to debridement 4. Dressing Applied: Contact layer 5. Secondary Dressing Applied Kerlix/Conform Non-Adherent pad 7. Secured  with Tape Notes stretch netting Electronic Signature(s) Signed: 10/18/2014 5:27:11 PM By: Montey Hora Entered By: Montey Hora on 10/18/2014 09:54:08 Lose,  JOURY ALLCORN ([id number] Hise, Joline Salt ([id number]) -------------------------------------------------------------------------------- Wound Assessment Details Patient Name: KAPRICE, KAGE. Date of Service: 10/18/2014 9:00 AM Medical Record Number: [id number] Patient Account Number: [id number] Date of Birth/Sex: 30-Sep-1929 (81 y.o. Female) Treating RN: Montey Hora Primary Care Physician: Ria Bush Other Clinician: Referring Physician: Quintella Reichert Treating Physician/Extender: Frann Rider in Treatment: 0 Wound Status Wound Number: 2 Primary Etiology: Trauma, Other Wound Location: Right Malleolus - Lateral Wound Status: Open Wounding Event: Trauma Comorbid History: Hypertension, Osteoarthritis Date Acquired: 10/07/2014 Weeks Of Treatment: 0 Clustered Wound: No Photos Photo Uploaded By: Gretta Cool, RN, BSN, Kim on 10/18/2014 13:05:03 Wound Measurements Length: (cm) 2 Width: (cm) 2.5 Depth: (cm) 0.1 Area: (cm) 3.927 Volume: (cm) 0.393 % Reduction in Area: 0% % Reduction in Volume: 0% Epithelialization: None Tunneling: No Undermining: No Wound Description Classification: Partial Thickness Wound Margin: Flat and Intact Exudate Amount: None Present Foul Odor After Cleansing: No Wound Bed Granulation Amount: None Present (0%) Exposed Structure Necrotic Amount: None Present (0%) Fascia Exposed: No Fat Layer Exposed: No Tendon Exposed: No Muscle Exposed: No Joint Exposed: No Hewlett, Shatera S. ([id number]) Bone Exposed: No Limited to Skin Breakdown Periwound Skin Texture Texture Color No Abnormalities Noted: No No Abnormalities Noted: No Callus: No Atrophie Blanche: No Crepitus: No Cyanosis: No Excoriation: No Ecchymosis: No Fluctuance: No Erythema: No Friable: No Hemosiderin  Staining: No Induration: No Mottled: No Localized Edema: No Pallor: No Rash: No Rubor: No Scarring: No Temperature / Pain Moisture Temperature: No Abnormality No Abnormalities Noted: No Dry / Scaly: No Maceration: No Moist: No Wound Preparation Ulcer Cleansing: Rinsed/Irrigated with Saline Topical Anesthetic Applied: Other: lidocaine 4%, Assessment Notes wound is scab covered Treatment Notes Wound #2 (Right, Lateral Malleolus) 1. Cleansed with: Clean wound with Normal Saline 4. Dressing Applied: Other dressing (specify in notes) Notes betadine paint Electronic Signature(s) Signed: 10/18/2014 5:27:11 PM By: Montey Hora Entered By: Montey Hora on 10/18/2014 09:54:55 [name] ([id number]) -------------------------------------------------------------------------------- [place] Details Patient Name: [name]. Date of Service: 10/18/2014 9:00 AM Medical Record Number: [id number] Patient Account Number: [id number] Date of Birth/Sex: [birth year] (81 y.o. Female) Treating RN: Cornell Barman Primary Care Physician: Ria Bush Other Clinician: Referring Physician: Quintella Reichert Treating Physician/Extender: Frann Rider in Treatment: 0 Vital Signs Time Taken: 09:30 Temperature (F): 97.9 Height (in): 64 Pulse (bpm): 93 Source: Stated Respiratory Rate (breaths/min): 18 Weight (lbs): 146 Blood Pressure (mmHg): 135/89 Source: Measured Reference Range: 80 - 120 mg / dl Body Mass Index (BMI): 25.1 Electronic Signature(s) Signed: 10/18/2014 2:36:54 PM By: Gretta Cool, RN, BSN, Kim RN, BSN Entered By: Gretta Cool, RN, BSN, Kim on 10/18/2014 09:33:29

## 2014-10-21 ENCOUNTER — Other Ambulatory Visit: Payer: Self-pay | Admitting: Family Medicine

## 2014-10-21 NOTE — Telephone Encounter (Signed)
Ok to refill 

## 2014-10-21 NOTE — Telephone Encounter (Signed)
Patient notified and verbalized understanding. Rx placed up front for pick up.  

## 2014-10-21 NOTE — Telephone Encounter (Signed)
Ok to refill. Printed and in Kim's box. Recommend slowly decrease use - to only as needed for breakthrough pain after tylenol.

## 2014-10-21 NOTE — Telephone Encounter (Signed)
Pt requesting rx for hydrocodone apap. OUT OF MED. Pt request to pick up rx today. Pt last seen and rx last printed # 30 on 10/13/14.Please advise.

## 2014-10-25 ENCOUNTER — Encounter: Payer: Medicare Other | Admitting: Surgery

## 2014-10-25 DIAGNOSIS — I4891 Unspecified atrial fibrillation: Secondary | ICD-10-CM | POA: Diagnosis not present

## 2014-10-25 DIAGNOSIS — I10 Essential (primary) hypertension: Secondary | ICD-10-CM | POA: Diagnosis not present

## 2014-10-25 DIAGNOSIS — Z87891 Personal history of nicotine dependence: Secondary | ICD-10-CM | POA: Diagnosis not present

## 2014-10-25 DIAGNOSIS — S80811A Abrasion, right lower leg, initial encounter: Secondary | ICD-10-CM | POA: Diagnosis not present

## 2014-10-25 DIAGNOSIS — K219 Gastro-esophageal reflux disease without esophagitis: Secondary | ICD-10-CM | POA: Diagnosis not present

## 2014-10-25 DIAGNOSIS — Z7901 Long term (current) use of anticoagulants: Secondary | ICD-10-CM | POA: Diagnosis not present

## 2014-10-25 DIAGNOSIS — S8781XA Crushing injury of right lower leg, initial encounter: Secondary | ICD-10-CM | POA: Diagnosis not present
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
## 2014-10-27 NOTE — Progress Notes (Signed)
[name], [name] ([id number]) Visit Report for 10/25/2014 Arrival Information Details Patient Name: [name], [name]. Date of Service: 10/25/2014 2:00 PM Medical Record Number: [id number] Patient Account Number: [id number] Date of Birth/Sex: [birth year] (81 y.o. Female) Treating RN: Cornell Barman Primary Care Physician: Ria Bush Other Clinician: Referring Physician: Ria Bush Treating Physician/Extender: Frann Rider in Treatment: 1 Visit Information History Since Last Visit Added or deleted any medications: No Patient Arrived: Wheel Chair Any new allergies or adverse reactions: No Arrival Time: 14:21 Had a fall or experienced change in No Accompanied By: friend activities of daily living that may affect Transfer Assistance: None risk of falls: Patient Identification Verified: Yes Signs or symptoms of abuse/neglect since last No Secondary Verification Process Yes visito Completed: Hospitalized since last visit: No Patient Has Alerts: Yes Pain Present Now: Yes Patient Alerts: Patient on Blood Thinner ABI L 0.89 Electronic Signature(s) Signed: 10/26/2014 4:45:53 PM By: Gretta Cool, RN, BSN, Kim RN, BSN Entered By: Gretta Cool, RN, BSN, Kim on 10/25/2014 14:22:06 [name] ([id number]) -------------------------------------------------------------------------------- Encounter Discharge Information Details Patient Name: [name]. Date of Service: 10/25/2014 2:00 PM Medical Record Number: [id number] Patient Account Number: [id number] Date of Birth/Sex: [birth year] (81 y.o. Female) Treating RN: Montey Hora Primary Care Physician: Ria Bush Other Clinician: Referring Physician: Ria Bush Treating Physician/Extender: Frann Rider in Treatment: 1 Encounter Discharge Information Items Discharge Pain Level: 0 Discharge Condition: Stable Ambulatory Status: Wheelchair Discharge Destination:  Home Private Transportation: Auto Accompanied By: dtr Schedule Follow-up Appointment: Yes Medication Reconciliation completed and No provided to Patient/Care [name]: Clinical Summary of Care: Electronic Signature(s) Signed: 10/25/2014 4:10:08 PM By: Montey Hora Entered By: Montey Hora on 10/25/2014 16:10:08 [name] ([id number]) -------------------------------------------------------------------------------- Lower Extremity Assessment Details Patient Name: [name]. Date of Service: 10/25/2014 2:00 PM Medical Record Number: [id number] Patient Account Number: [id number] Date of Birth/Sex: 1929/06/02 (81 y.o. Female) Treating RN: Cornell Barman Primary Care Physician: Ria Bush Other Clinician: Referring Physician: Ria Bush Treating Physician/Extender: Frann Rider in Treatment: 1 Edema Assessment Assessed: [Left: No] [Right: No] E[Left: dema] [Right: :] Calf Left: Right: Point of Measurement: 31 cm From Medial Instep cm 31.7 cm Ankle Left: Right: Point of Measurement: 10 cm From Medial Instep cm 20.4 cm Vascular Assessment Pulses: Posterior Tibial Palpable: [Right:Yes] Dorsalis Pedis Palpable: [Right:Yes] Extremity colors, hair growth, and conditions: Extremity Color: [Right:Red] Hair Growth on Extremity: [Right:Yes] Temperature of Extremity: [Right:Warm] Capillary Refill: [Right:< 3 seconds] Toe Nail Assessment Left: Right: Thick: No Discolored: No Deformed: No Improper Length and Hygiene: No Electronic Signature(s) Signed: 10/26/2014 4:45:53 PM By: Gretta Cool, RN, BSN, Kim RN, BSN Entered By: Gretta Cool, RN, BSN, Kim on 10/25/2014 14:32:40 [name], [name] ([id number]) [name], [name] ([id number]) -------------------------------------------------------------------------------- Multi Wound Chart Details Patient Name: [name]. Date of Service: 10/25/2014 2:00 PM Medical Record Number: [id number] Patient Account Number:  [id number] Date of Birth/Sex: 10/18/1929 (81 y.o. Female) Treating RN: Cornell Barman Primary Care Physician: Ria Bush Other Clinician: Referring Physician: Ria Bush Treating Physician/Extender: Frann Rider in Treatment: 1 Vital Signs Height(in): 64 Pulse(bpm): 54 Weight(lbs): 146 Blood Pressure 154/68 (mmHg): Body Mass Index(BMI): 25 Temperature(F): 98.1 Respiratory Rate 18 (breaths/min): Photos: [1:No Photos] [2:No Photos] [N/A:N/A] Wound Location: [1:Right Lower Leg] [2:Right Malleolus - Lateral] [N/A:N/A] Wounding Event: [1:Trauma] [2:Trauma] [N/A:N/A] Primary Etiology: [1:Trauma, Other] [2:Trauma, Other] [N/A:N/A] Comorbid History: [1:Hypertension, Osteoarthritis] [2:Hypertension, Osteoarthritis] [N/A:N/A] Date Acquired: [1:10/07/2014] [2:10/07/2014] [N/A:N/A] Weeks of Treatment: [1:1] [2:1] [N/A:N/A] Wound Status: [1:Open] [2:Open] [N/A:N/A] Measurements L x W x D 7.7x3.6x0.1 [2:1.8x1.5x0.1] [  N/A:N/A] (cm) Area (cm) : [1:21.771] [2:2.121] [N/A:N/A] Volume (cm) : [1:2.177] [2:0.212] [N/A:N/A] % Reduction in Area: [1:61.80%] [2:46.00%] [N/A:N/A] % Reduction in Volume: 61.80% [2:46.10%] [N/A:N/A] Classification: [1:Partial Thickness] [2:Partial Thickness] [N/A:N/A] Exudate Amount: [1:None Present] [2:None Present] [N/A:N/A] Wound Margin: [1:Flat and Intact] [2:Flat and Intact] [N/A:N/A] Granulation Amount: [1:Medium (34-66%)] [2:None Present (0%)] [N/A:N/A] Granulation Quality: [1:Red] [2:N/A] [N/A:N/A] Necrotic Amount: [1:Medium (34-66%)] [2:None Present (0%)] [N/A:N/A] Necrotic Tissue: [1:Eschar] [2:N/A] [N/A:N/A] Exposed Structures: [1:Fascia: No Fat: No Tendon: No Muscle: No Joint: No Bone: No Limited to Skin Breakdown] [2:Fascia: No Fat: No Tendon: No Muscle: No Joint: No Bone: No Limited to Skin Breakdown] [N/A:N/A] Epithelialization: Medium (34-66%) None N/A Periwound Skin Texture: Edema: No Edema: No N/A Excoriation: No Excoriation:  No Induration: No Induration: No Callus: No Callus: No Crepitus: No Crepitus: No Fluctuance: No Fluctuance: No Friable: No Friable: No Rash: No Rash: No Scarring: No Scarring: No Periwound Skin Maceration: No Maceration: No N/A Moisture: Moist: No Moist: No Dry/Scaly: No Dry/Scaly: No Periwound Skin Color: Atrophie Blanche: No Atrophie Blanche: No N/A Cyanosis: No Cyanosis: No Ecchymosis: No Ecchymosis: No Erythema: No Erythema: No Hemosiderin Staining: No Hemosiderin Staining: No Mottled: No Mottled: No Pallor: No Pallor: No Rubor: No Rubor: No Temperature: No Abnormality No Abnormality N/A Tenderness on No No N/A Palpation: Wound Preparation: Ulcer Cleansing: Ulcer Cleansing: N/A Rinsed/Irrigated with Rinsed/Irrigated with Saline Saline Topical Anesthetic Topical Anesthetic Applied: Other: lidocaine Applied: Other: lidocaine 4% 4% Assessment Notes: N/A wound is scab covered N/A Treatment Notes Electronic Signature(s) Signed: 10/26/2014 4:45:53 PM By: Gretta Cool, RN, BSN, Kim RN, BSN Entered By: Gretta Cool, RN, BSN, Kim on 10/25/2014 14:45:58 [name] ([id number]) -------------------------------------------------------------------------------- [place] Details Patient Name: [name], [name]. Date of Service: 10/25/2014 2:00 PM Medical Record Number: [id number] Patient Account Number: [id number] Date of Birth/Sex: 1929/10/15 (81 y.o. Female) Treating RN: Cornell Barman Primary Care Physician: Ria Bush Other Clinician: Referring Physician: Ria Bush Treating Physician/Extender: Frann Rider in Treatment: 1 Active Inactive Abuse / Safety / Falls / Self Care Management Nursing Diagnoses: Potential for falls Goals: Patient will remain injury free Date Initiated: 10/18/2014 Goal Status: Active Interventions: Assess fall risk on admission and as needed Notes: Orientation to the Wound Care Program Nursing  Diagnoses: Knowledge deficit related to the wound healing center program Goals: Patient/caregiver will verbalize understanding of the [facility] Date Initiated: 10/18/2014 Goal Status: Active Interventions: Provide education on orientation to the wound center Notes: Wound/Skin Impairment Nursing Diagnoses: Impaired tissue integrity Goals: Ulcer/skin breakdown will heal within 14 weeks Date Initiated: 10/18/2014 AELYN, STANALAND ([id number]) Goal Status: Active Interventions: Assess ulceration(s) every visit Notes: Electronic Signature(s) Signed: 10/26/2014 4:45:53 PM By: Gretta Cool, RN, BSN, Kim RN, BSN Entered By: Gretta Cool, RN, BSN, Kim on 10/25/2014 14:45:49 [name] ([id number]) -------------------------------------------------------------------------------- Pain Assessment Details Patient Name: [name]. Date of Service: 10/25/2014 2:00 PM Medical Record Number: [id number] Patient Account Number: [id number] Date of Birth/Sex: 1929/10/19 (81 y.o. Female) Treating RN: Cornell Barman Primary Care Physician: Ria Bush Other Clinician: Referring Physician: Ria Bush Treating Physician/Extender: Frann Rider in Treatment: 1 Active Problems Location of Pain Severity and Description of Pain Patient Has Paino Yes Site Locations Pain Location: Pain in Ulcers With Dressing Change: Yes Duration of the Pain. Constant / Intermittento Constant Character of Pain Describe the Pain: Throbbing Pain Management and Medication Current Pain Management: Electronic Signature(s) Signed: 10/26/2014 4:45:53 PM By: Gretta Cool, RN, BSN, Kim RN, BSN Entered By: Gretta Cool, RN, BSN, Kim on  10/25/2014 14:22:27 [name], [name] ([id number]) -------------------------------------------------------------------------------- Patient/Caregiver Education Details Patient Name: [name], [name]. Date of Service: 10/25/2014 2:00 PM Medical Record Number: [id number] Patient  Account Number: [id number] Date of Birth/Gender: Sep 10, 1929 (81 y.o. Female) Treating RN: Montey Hora Primary Care Physician: Ria Bush Other Clinician: Referring Physician: Ria Bush Treating Physician/Extender: Frann Rider in Treatment: 1 Education Assessment Education Provided To: Patient and Caregiver Education Topics Provided Wound/Skin Impairment: Handouts: Other: wound care to continue as ordered Methods: Demonstration, Explain/Verbal Responses: State content correctly Electronic Signature(s) Signed: 10/25/2014 4:10:27 PM By: Montey Hora Entered By: Montey Hora on 10/25/2014 16:10:27 [name] ([id number]) -------------------------------------------------------------------------------- Wound Assessment Details Patient Name: [name]. Date of Service: 10/25/2014 2:00 PM Medical Record Number: [id number] Patient Account Number: [id number] Date of Birth/Sex: 11-18-1929 (81 y.o. Female) Treating RN: Cornell Barman Primary Care Physician: Ria Bush Other Clinician: Referring Physician: Ria Bush Treating Physician/Extender: Frann Rider in Treatment: 1 Wound Status Wound Number: 1 Primary Etiology: Trauma, Other Wound Location: Right Lower Leg Wound Status: Open Wounding Event: Trauma Comorbid History: Hypertension, Osteoarthritis Date Acquired: 10/07/2014 Weeks Of Treatment: 1 Clustered Wound: No Photos Photo Uploaded By: Montey Hora on 10/25/2014 16:13:31 Wound Measurements Length: (cm) 7.7 Width: (cm) 3.6 Depth: (cm) 0.1 Area: (cm) 21.771 Volume: (cm) 2.177 % Reduction in Area: 61.8% % Reduction in Volume: 61.8% Epithelialization: Medium (34-66%) Tunneling: No Undermining: No Wound Description Classification: Partial Thickness Wound Margin: Flat and Intact Exudate Amount: None Present Foul Odor After Cleansing: No Wound Bed Granulation Amount: Medium (34-66%) Exposed  Structure Granulation Quality: Red Fascia Exposed: No Necrotic Amount: Medium (34-66%) Fat Layer Exposed: No Necrotic Quality: Eschar Tendon Exposed: No Muscle Exposed: No Joint Exposed: No Payne, Crystal S. ([id number]) Bone Exposed: No Limited to Skin Breakdown Periwound Skin Texture Texture Color No Abnormalities Noted: No No Abnormalities Noted: No Callus: No Atrophie Blanche: No Crepitus: No Cyanosis: No Excoriation: No Ecchymosis: No Fluctuance: No Erythema: No Friable: No Hemosiderin Staining: No Induration: No Mottled: No Localized Edema: No Pallor: No Rash: No Rubor: No Scarring: No Temperature / Pain Moisture Temperature: No Abnormality No Abnormalities Noted: No Dry / Scaly: No Maceration: No Moist: No Wound Preparation Ulcer Cleansing: Rinsed/Irrigated with Saline Topical Anesthetic Applied: Other: lidocaine 4%, Treatment Notes Wound #1 (Right Lower Leg) 1. Cleansed with: Clean wound with Normal Saline 2. Anesthetic Topical Lidocaine 4% cream to wound bed prior to debridement 4. Dressing Applied: Petroleum gauze 5. Secondary Dressing Applied ABD and Kerlix/Conform 7. Secured with Tape Notes Curator) Signed: 10/26/2014 4:45:53 PM By: Gretta Cool, RN, BSN, Kim RN, BSN Entered By: Gretta Cool, RN, BSN, Kim on 10/25/2014 14:30:02 [name], [name] ([id number]) -------------------------------------------------------------------------------- Wound Assessment Details Patient Name: [name], [name]. Date of Service: 10/25/2014 2:00 PM Medical Record Number: [id number] Patient Account Number: [id number] Date of Birth/Sex: 13-Apr-1929 (81 y.o. Female) Treating RN: Cornell Barman Primary Care Physician: Ria Bush Other Clinician: Referring Physician: Ria Bush Treating Physician/Extender: Frann Rider in Treatment: 1 Wound Status Wound Number: 2 Primary Etiology: Trauma, Other Wound Location: Right Malleolus -  Lateral Wound Status: Open Wounding Event: Trauma Comorbid History: Hypertension, Osteoarthritis Date Acquired: 10/07/2014 Weeks Of Treatment: 1 Clustered Wound: No Photos Photo Uploaded By: Montey Hora on 10/25/2014 16:13:32 Wound Measurements Length: (cm) 1.8 Width: (cm) 1.5 Depth: (cm) 0.1 Area: (cm) 2.121 Volume: (cm) 0.212 % Reduction in Area: 46% % Reduction in Volume: 46.1% Epithelialization: None Tunneling: No Undermining: No Wound Description Classification: Partial Thickness Wound Margin: Flat and Intact Exudate Amount:  None Present Foul Odor After Cleansing: No Wound Bed Granulation Amount: None Present (0%) Exposed Structure Necrotic Amount: None Present (0%) Fascia Exposed: No Fat Layer Exposed: No Tendon Exposed: No Muscle Exposed: No Joint Exposed: No Haith, Contrina S. ([id number]) Bone Exposed: No Limited to Skin Breakdown Periwound Skin Texture Texture Color No Abnormalities Noted: No No Abnormalities Noted: No Callus: No Atrophie Blanche: No Crepitus: No Cyanosis: No Excoriation: No Ecchymosis: No Fluctuance: No Erythema: No Friable: No Hemosiderin Staining: No Induration: No Mottled: No Localized Edema: No Pallor: No Rash: No Rubor: No Scarring: No Temperature / Pain Moisture Temperature: No Abnormality No Abnormalities Noted: No Dry / Scaly: No Maceration: No Moist: No Wound Preparation Ulcer Cleansing: Rinsed/Irrigated with Saline Topical Anesthetic Applied: Other: lidocaine 4%, Assessment Notes wound is scab covered Treatment Notes Wound #2 (Right, Lateral Malleolus) 1. Cleansed with: Clean wound with Normal Saline 4. Dressing Applied: Other dressing (specify in notes) Notes betadine paint Electronic Signature(s) Signed: 10/26/2014 4:45:53 PM By: Gretta Cool, RN, BSN, Kim RN, BSN Entered By: Gretta Cool, RN, BSN, Kim on 10/25/2014 14:30:27 MIZANI, DILDAY  ([id number]) -------------------------------------------------------------------------------- [place] Details Patient Name: [name] Date of Service: 10/25/2014 2:00 PM Medical Record Number: [id number] Patient Account Number: [id number] Date of Birth/Sex: May 08, 1929 (81 y.o. Female) Treating RN: Cornell Barman Primary Care Physician: Ria Bush Other Clinician: Referring Physician: Ria Bush Treating Physician/Extender: Frann Rider in Treatment: 1 Vital Signs Time Taken: 14:22 Temperature (F): 98.1 Height (in): 64 Pulse (bpm): 54 Weight (lbs): 146 Respiratory Rate (breaths/min): 18 Body Mass Index (BMI): 25.1 Blood Pressure (mmHg): 154/68 Reference Range: 80 - 120 mg / dl Electronic Signature(s) Signed: 10/26/2014 4:45:53 PM By: Gretta Cool, RN, BSN, Kim RN, BSN Entered By: Gretta Cool, RN, BSN, Kim on 10/25/2014 14:24:16

## 2014-10-29 ENCOUNTER — Other Ambulatory Visit: Payer: Self-pay | Admitting: Cardiovascular Disease

## 2014-10-29 ENCOUNTER — Other Ambulatory Visit: Payer: Self-pay | Admitting: Family Medicine

## 2014-10-31 NOTE — Telephone Encounter (Signed)
Please call pt- who has been prescribing this for her?

## 2014-10-31 NOTE — Telephone Encounter (Signed)
Left message on voicemail.

## 2014-10-31 NOTE — Telephone Encounter (Signed)
Dr Darnell Level pt---I cannot see exactly when last filled and did not see medication on med list or in notes from Cardiology--please advise

## 2014-10-31 NOTE — Telephone Encounter (Signed)
Please review for refill. Thanks!  

## 2014-11-01 ENCOUNTER — Encounter: Payer: Medicare Other | Admitting: Surgery

## 2014-11-01 DIAGNOSIS — S80811A Abrasion, right lower leg, initial encounter: Secondary | ICD-10-CM | POA: Diagnosis not present

## 2014-11-01 DIAGNOSIS — I10 Essential (primary) hypertension: Secondary | ICD-10-CM | POA: Diagnosis not present

## 2014-11-01 DIAGNOSIS — Z7901 Long term (current) use of anticoagulants: Secondary | ICD-10-CM | POA: Diagnosis not present

## 2014-11-01 DIAGNOSIS — K219 Gastro-esophageal reflux disease without esophagitis: Secondary | ICD-10-CM | POA: Diagnosis not present

## 2014-11-01 DIAGNOSIS — Z87891 Personal history of nicotine dependence: Secondary | ICD-10-CM | POA: Diagnosis not present

## 2014-11-01 DIAGNOSIS — I4891 Unspecified atrial fibrillation: Secondary | ICD-10-CM | POA: Diagnosis not present

## 2014-11-01 DIAGNOSIS — S8781XA Crushing injury of right lower leg, initial encounter: Secondary | ICD-10-CM | POA: Diagnosis not present

## 2014-11-01 NOTE — Telephone Encounter (Signed)
If she has not been taking it, I would wait to see what PCP advises once he returns.  Will route to PCP.

## 2014-11-01 NOTE — Telephone Encounter (Signed)
Patient called back and said she did have the prescription.  Patient received the medication in March,2016 and it was prescribed by Dr.Gutierrez.  Patient said she hasn't been taking it and she can't remember the last time she took it.  Please let patient know if she should be taking the medication.

## 2014-11-01 NOTE — Telephone Encounter (Signed)
Patient said she hasn't been taking this medication.  She's not sure why the pharmacy requested it.  Patient didn't call the pharmacy for a refill.

## 2014-11-02 ENCOUNTER — Other Ambulatory Visit: Payer: Self-pay | Admitting: Cardiovascular Disease

## 2014-11-02 ENCOUNTER — Other Ambulatory Visit: Payer: Self-pay
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
## 2014-11-02 NOTE — Progress Notes (Signed)
EMALEY, APPLIN ([id number]) Visit Report for 11/01/2014 Arrival Information Details Patient Name: [name], [name]. Date of Service: 11/01/2014 3:15 PM Medical Record Number: [id number] Patient Account Number: [id number] Date of Birth/Sex: 14-Dec-1929 (81 y.o. Female) Treating RN: Cornell Barman Primary Care Physician: Ria Bush Other Clinician: Referring Physician: Ria Bush Treating Physician/Extender: Frann Rider in Treatment: 2 Visit Information History Since Last Visit Added or deleted any medications: No Patient Arrived: Walker Any new allergies or adverse reactions: No Arrival Time: 15:03 Had a fall or experienced change in No Accompanied By: daughter activities of daily living that may affect Transfer Assistance: None risk of falls: Patient Identification Verified: Yes Signs or symptoms of abuse/neglect since last No Secondary Verification Process Yes visito Completed: Hospitalized since last visit: No Patient Has Alerts: Yes Has Dressing in Place as Prescribed: Yes Patient Alerts: Patient on Blood Pain Present Now: Yes Thinner ABI L 0.89 Electronic Signature(s) Signed: 11/01/2014 5:02:25 PM By: Gretta Cool, RN, BSN, Kim RN, BSN Entered By: Gretta Cool, RN, BSN, Kim on 11/01/2014 15:04:55 [name] ([id number]) -------------------------------------------------------------------------------- Clinic Level of Care Assessment Details Patient Name: [name]. Date of Service: 11/01/2014 3:15 PM Medical Record Number: [id number] Patient Account Number: [id number] Date of Birth/Sex: [birth year] (81 y.o. Female) Treating RN: Cornell Barman Primary Care Physician: Ria Bush Other Clinician: Referring Physician: Ria Bush Treating Physician/Extender: Frann Rider in Treatment: 2 Clinic Level of Care Assessment Items TOOL 4 Quantity Score []  - Use when only an EandM is performed on FOLLOW-UP visit 0 ASSESSMENTS - Nursing Assessment /  Reassessment []  - Reassessment of Co-morbidities (includes updates in patient status) 0 X - Reassessment of Adherence to Treatment Plan 1 5 ASSESSMENTS - Wound and Skin Assessment / Reassessment X - Simple Wound Assessment / Reassessment - one wound 1 5 []  - Complex Wound Assessment / Reassessment - multiple wounds 0 []  - Dermatologic / Skin Assessment (not related to wound area) 0 ASSESSMENTS - Focused Assessment []  - Circumferential Edema Measurements - multi extremities 0 []  - Nutritional Assessment / Counseling / Intervention 0 []  - Lower Extremity Assessment (monofilament, tuning fork, pulses) 0 []  - Peripheral Arterial Disease Assessment (using hand held doppler) 0 ASSESSMENTS - Ostomy and/or Continence Assessment and Care []  - Incontinence Assessment and Management 0 []  - Ostomy Care Assessment and Management (repouching, etc.) 0 PROCESS - Coordination of Care X - Simple Patient / Family Education for ongoing care 1 15 []  - Complex (extensive) Patient / Family Education for ongoing care 0 []  - Staff obtains Programmer, systems, Records, Test Results / Process Orders 0 []  - Staff telephones HHA, Nursing Homes / Clarify orders / etc 0 []  - Routine Transfer to another Facility (non-emergent condition) 0 K[place]STYNE, [place]. ([id number]) []  - Routine Hospital Admission (non-emergent condition) 0 []  - New Admissions / Biomedical engineer / Ordering NPWT, Apligraf, etc. 0 []  - Emergency Hospital Admission (emergent condition) 0 X - Simple Discharge Coordination 1 10 []  - Complex (extensive) Discharge Coordination 0 PROCESS - Special Needs []  - Pediatric / Minor Patient Management 0 []  - Isolation Patient Management 0 []  - Hearing / Language / Visual special needs 0 []  - Assessment of Community assistance (transportation, D/C planning, etc.) 0 []  - Additional assistance / Altered mentation 0 []  - Support Surface(s) Assessment (bed, cushion, seat, etc.) 0 INTERVENTIONS - Wound Cleansing /  Measurement X - Simple Wound Cleansing - one wound 1 5 []  - Complex Wound Cleansing - multiple wounds 0 X - Wound Imaging (photographs -  any number of wounds) 1 5 []  - Wound Tracing (instead of photographs) 0 X - Simple Wound Measurement - one wound 1 5 []  - Complex Wound Measurement - multiple wounds 0 INTERVENTIONS - Wound Dressings X - Small Wound Dressing one or multiple wounds 1 10 []  - Medium Wound Dressing one or multiple wounds 0 []  - Large Wound Dressing one or multiple wounds 0 []  - Application of Medications - topical 0 []  - Application of Medications - injection 0 INTERVENTIONS - Miscellaneous []  - External ear exam 0 Reh, Swan S. ([id number]) []  - Specimen Collection (cultures, biopsies, blood, body fluids, etc.) 0 []  - Specimen(s) / Culture(s) sent or taken to Lab for analysis 0 []  - Patient Transfer (multiple staff / Harrel Lemon Lift / Similar devices) 0 []  - Simple Staple / Suture removal (25 or less) 0 []  - Complex Staple / Suture removal (26 or more) 0 []  - Hypo / Hyperglycemic Management (close monitor of Blood Glucose) 0 []  - Ankle / Brachial Index (ABI) - do not check if billed separately 0 X - Vital Signs 1 5 Has the patient been seen at the hospital within the last three years: Yes Total Score: 65 Level Of Care: New/Established - Level 2 Electronic Signature(s) Signed: 11/01/2014 5:02:25 PM By: Gretta Cool, RN, BSN, Kim RN, BSN Entered By: Gretta Cool, RN, BSN, Kim on 11/01/2014 15:32:41 [name] ([id number]) -------------------------------------------------------------------------------- Lower Extremity Assessment Details Patient Name: [name]. Date of Service: 11/01/2014 3:15 PM Medical Record Number: [id number] Patient Account Number: [id number] Date of Birth/Sex: 04-14-1929 (81 y.o. Female) Treating RN: Cornell Barman Primary Care Physician: Ria Bush Other Clinician: Referring Physician: Ria Bush Treating Physician/Extender: Frann Rider in Treatment: 2 Edema Assessment Assessed: [Left: No] [Right: No] E[Left: dema] [Right: :] Calf Left: Right: Point of Measurement: 31 cm From Medial Instep cm 31.6 cm Ankle Left: Right: Point of Measurement: 10 cm From Medial Instep cm 19.6 cm Vascular Assessment Pulses: Posterior Tibial Dorsalis Pedis Palpable: [Right:Yes] Extremity colors, hair growth, and conditions: Hair Growth on Extremity: [Right:Yes] Temperature of Extremity: [Right:Warm] Capillary Refill: [Right:< 3 seconds] Toe Nail Assessment Left: Right: Thick: No Discolored: No Deformed: No Improper Length and Hygiene: No Electronic Signature(s) Signed: 11/01/2014 5:02:25 PM By: Gretta Cool, RN, BSN, Kim RN, BSN Entered By: Gretta Cool, RN, BSN, Kim on 11/01/2014 15:12:00 [name] ([id number]) -------------------------------------------------------------------------------- Multi Wound Chart Details Patient Name: [name]. Date of Service: 11/01/2014 3:15 PM Medical Record Number: [id number] Patient Account Number: [id number] Date of Birth/Sex: 1930/03/23 (81 y.o. Female) Treating RN: Cornell Barman Primary Care Physician: Ria Bush Other Clinician: Referring Physician: Ria Bush Treating Physician/Extender: Frann Rider in Treatment: 2 Vital Signs Height(in): 64 Pulse(bpm): 59 Weight(lbs): 146 Blood Pressure 123/59 (mmHg): Body Mass Index(BMI): 25 Temperature(F): 97.7 Respiratory Rate 18 (breaths/min): Photos: [1:No Photos] [2:No Photos] [N/A:N/A] Wound Location: [1:Right Lower Leg] [2:Right Malleolus - Lateral] [N/A:N/A] Wounding Event: [1:Trauma] [2:Trauma] [N/A:N/A] Primary Etiology: [1:Trauma, Other] [2:Trauma, Other] [N/A:N/A] Comorbid History: [1:Hypertension, Osteoarthritis] [2:Hypertension, Osteoarthritis] [N/A:N/A] Date Acquired: [1:10/07/2014] [2:10/07/2014] [N/A:N/A] Weeks of Treatment: [1:2] [2:2] [N/A:N/A] Wound Status: [1:Open] [2:Open]  [N/A:N/A] Measurements L x W x D 3x1.2x0.1 [2:1.6x1.4x0.1] [N/A:N/A] (cm) Area (cm) : [1:2.827] [2:1.759] [N/A:N/A] Volume (cm) : [1:0.283] [2:0.176] [N/A:N/A] % Reduction in Area: [1:95.00%] [2:55.20%] [N/A:N/A] % Reduction in Volume: 95.00% [2:55.20%] [N/A:N/A] Classification: [1:Partial Thickness] [2:Partial Thickness] [N/A:N/A] Exudate Amount: [1:None Present] [2:None Present] [N/A:N/A] Wound Margin: [1:Flat and Intact] [2:Flat and Intact] [N/A:N/A] Granulation Amount: [1:Medium (34-66%)] [2:None Present (0%)] [N/A:N/A] Granulation Quality: [  1:Red] [2:N/A] [N/A:N/A] Necrotic Amount: [1:Medium (34-66%)] [2:Medium (34-66%)] [N/A:N/A] Necrotic Tissue: [1:Eschar] [2:Eschar] [N/A:N/A] Exposed Structures: [1:Fascia: No Fat: No Tendon: No Muscle: No Joint: No Bone: No Limited to Skin Breakdown] [2:Fascia: No Fat: No Tendon: No Muscle: No Joint: No Bone: No Limited to Skin Breakdown] [N/A:N/A] Epithelialization: Medium (34-66%) None N/A Periwound Skin Texture: Induration: Yes Edema: No N/A Edema: No Excoriation: No Excoriation: No Induration: No Callus: No Callus: No Crepitus: No Crepitus: No Fluctuance: No Fluctuance: No Friable: No Friable: No Rash: No Rash: No Scarring: No Scarring: No Periwound Skin Maceration: No Maceration: No N/A Moisture: Moist: No Moist: No Dry/Scaly: No Dry/Scaly: No Periwound Skin Color: Atrophie Blanche: No Atrophie Blanche: No N/A Cyanosis: No Cyanosis: No Ecchymosis: No Ecchymosis: No Erythema: No Erythema: No Hemosiderin Staining: No Hemosiderin Staining: No Mottled: No Mottled: No Pallor: No Pallor: No Rubor: No Rubor: No Temperature: No Abnormality No Abnormality N/A Tenderness on No No N/A Palpation: Wound Preparation: Ulcer Cleansing: Ulcer Cleansing: N/A Rinsed/Irrigated with Rinsed/Irrigated with Saline Saline Topical Anesthetic Topical Anesthetic Applied: Other: lidocaine Applied: Other: lidocaine 4%  4% Assessment Notes: Large knot under wound. N/A N/A Treatment Notes Electronic Signature(s) Signed: 11/01/2014 5:02:25 PM By: Gretta Cool, RN, BSN, Kim RN, BSN Entered By: Gretta Cool, RN, BSN, Kim on 11/01/2014 15:24:22 MICCA, MATURA ([id number]) -------------------------------------------------------------------------------- [place] Details Patient Name: LAMONICA, TRUEBA. Date of Service: 11/01/2014 3:15 PM Medical Record Number: [id number] Patient Account Number: [id number] Date of Birth/Sex: 06/11/1929 (81 y.o. Female) Treating RN: Cornell Barman Primary Care Physician: Ria Bush Other Clinician: Referring Physician: Ria Bush Treating Physician/Extender: Frann Rider in Treatment: 2 Active Inactive Abuse / Safety / Falls / Self Care Management Nursing Diagnoses: Potential for falls Goals: Patient will remain injury free Date Initiated: 10/18/2014 Goal Status: Active Interventions: Assess fall risk on admission and as needed Notes: Orientation to the Wound Care Program Nursing Diagnoses: Knowledge deficit related to the wound healing center program Goals: Patient/caregiver will verbalize understanding of the [facility] Program Date Initiated: 10/18/2014 Goal Status: Active Interventions: Provide education on orientation to the wound center Notes: Wound/Skin Impairment Nursing Diagnoses: Impaired tissue integrity Goals: Ulcer/skin breakdown will heal within 14 weeks Date Initiated: 10/18/2014 SANDREA, BOER ([id number]) Goal Status: Active Interventions: Assess ulceration(s) every visit Notes: Electronic Signature(s) Signed: 11/01/2014 5:02:25 PM By: Gretta Cool, RN, BSN, Kim RN, BSN Entered By: Gretta Cool, RN, BSN, Kim on 11/01/2014 15:24:12 [name] ([id number]) -------------------------------------------------------------------------------- Pain Assessment Details Patient Name: [name]. Date of Service:  11/01/2014 3:15 PM Medical Record Number: [id number] Patient Account Number: [id number] Date of Birth/Sex: [birth year] (81 y.o. Female) Treating RN: Cornell Barman Primary Care Physician: Ria Bush Other Clinician: Referring Physician: Ria Bush Treating Physician/Extender: Frann Rider in Treatment: 2 Active Problems Location of Pain Severity and Description of Pain Patient Has Paino No Site Locations Duration of the Pain. Constant / Intermittento Intermittent Rate the pain. Current Pain Level: 7 Worst Pain Level: 10 Least Pain Level: 3 Character of Pain Describe the Pain: Splitting, Tender, Throbbing Pain Management and Medication Current Pain Management: Medication: Yes Cold Application: No Rest: No Massage: No Activity: No T.E.N.S.: No Heat Application: No Leg drop or elevation: No Is the Current Pain Management Inadequate Adequate: Electronic Signature(s) Signed: 11/01/2014 5:02:25 PM By: Gretta Cool, RN, BSN, Kim RN, BSN Entered By: Gretta Cool, RN, BSN, Kim on 11/01/2014 15:05:38 [name] ([id number]) -------------------------------------------------------------------------------- Wound Assessment Details Patient Name: [name]. Date of Service: 11/01/2014 3:15 PM Medical Record Number: [id number]  Patient Account Number: [id number] Date of Birth/Sex: [birth year] (81 y.o. Female) Treating RN: Cornell Barman Primary Care Physician: Ria Bush Other Clinician: Referring Physician: Ria Bush Treating Physician/Extender: Frann Rider in Treatment: 2 Wound Status Wound Number: 1 Primary Etiology: Trauma, Other Wound Location: Right Lower Leg Wound Status: Open Wounding Event: Trauma Comorbid History: Hypertension, Osteoarthritis Date Acquired: 10/07/2014 Weeks Of Treatment: 2 Clustered Wound: No Photos Photo Uploaded By: Gretta Cool, RN, BSN, Kim on 11/01/2014 17:02:04 Wound Measurements Length: (cm) 3 Width: (cm) 1.2 Depth:  (cm) 0.1 Area: (cm) 2.827 Volume: (cm) 0.283 % Reduction in Area: 95% % Reduction in Volume: 95% Epithelialization: Medium (34-66%) Wound Description Classification: Partial Thickness Wound Margin: Flat and Intact Exudate Amount: None Present Foul Odor After Cleansing: No Wound Bed Granulation Amount: Medium (34-66%) Exposed Structure Granulation Quality: Red Fascia Exposed: No Necrotic Amount: Medium (34-66%) Fat Layer Exposed: No Necrotic Quality: Eschar Tendon Exposed: No Muscle Exposed: No Joint Exposed: No Crall, Noriah S. ([id number]) Bone Exposed: No Limited to Skin Breakdown Periwound Skin Texture Texture Color No Abnormalities Noted: No No Abnormalities Noted: No Callus: No Atrophie Blanche: No Crepitus: No Cyanosis: No Excoriation: No Ecchymosis: No Fluctuance: No Erythema: No Friable: No Hemosiderin Staining: No Induration: Yes Mottled: No Localized Edema: No Pallor: No Rash: No Rubor: No Scarring: No Temperature / Pain Moisture Temperature: No Abnormality No Abnormalities Noted: No Dry / Scaly: No Maceration: No Moist: No Wound Preparation Ulcer Cleansing: Rinsed/Irrigated with Saline Topical Anesthetic Applied: Other: lidocaine 4%, Assessment Notes Large knot under wound. Electronic Signature(s) Signed: 11/01/2014 5:02:25 PM By: Gretta Cool, RN, BSN, Kim RN, BSN Entered By: Gretta Cool, RN, BSN, Kim on 11/01/2014 15:13:33 KAMARIE, VENO ([id number]) -------------------------------------------------------------------------------- Wound Assessment Details Patient Name: DNIYA, NEUHAUS. Date of Service: 11/01/2014 3:15 PM Medical Record Number: [id number] Patient Account Number: [id number] Date of Birth/Sex: 06/02/1929 (81 y.o. Female) Treating RN: Cornell Barman Primary Care Physician: Ria Bush Other Clinician: Referring Physician: Ria Bush Treating Physician/Extender: Frann Rider in Treatment: 2 Wound Status Wound  Number: 2 Primary Etiology: Trauma, Other Wound Location: Right Malleolus - Lateral Wound Status: Open Wounding Event: Trauma Comorbid History: Hypertension, Osteoarthritis Date Acquired: 10/07/2014 Weeks Of Treatment: 2 Clustered Wound: No Photos Photo Uploaded By: Gretta Cool, RN, BSN, Kim on 11/01/2014 17:02:05 Wound Measurements Length: (cm) 1.6 Width: (cm) 1.4 Depth: (cm) 0.1 Area: (cm) 1.759 Volume: (cm) 0.176 % Reduction in Area: 55.2% % Reduction in Volume: 55.2% Epithelialization: None Wound Description Classification: Partial Thickness Wound Margin: Flat and Intact Exudate Amount: None Present Foul Odor After Cleansing: No Wound Bed Granulation Amount: None Present (0%) Exposed Structure Necrotic Amount: Medium (34-66%) Fascia Exposed: No Necrotic Quality: Eschar Fat Layer Exposed: No Tendon Exposed: No Muscle Exposed: No Joint Exposed: No Balducci, Jennica S. ([id number]) Bone Exposed: No Limited to Skin Breakdown Periwound Skin Texture Texture Color No Abnormalities Noted: No No Abnormalities Noted: No Callus: No Atrophie Blanche: No Crepitus: No Cyanosis: No Excoriation: No Ecchymosis: No Fluctuance: No Erythema: No Friable: No Hemosiderin Staining: No Induration: No Mottled: No Localized Edema: No Pallor: No Rash: No Rubor: No Scarring: No Temperature / Pain Moisture Temperature: No Abnormality No Abnormalities Noted: No Dry / Scaly: No Maceration: No Moist: No Wound Preparation Ulcer Cleansing: Rinsed/Irrigated with Saline Topical Anesthetic Applied: Other: lidocaine 4%, Electronic Signature(s) Signed: 11/01/2014 5:02:25 PM By: Gretta Cool, RN, BSN, Kim RN, BSN Entered By: Gretta Cool, RN, BSN, Kim on 11/01/2014 15:14:07 [name] ([id number]) -------------------------------------------------------------------------------- [place] Details Patient Name: [name] Date of Service: 11/01/2014 3:15  PM Medical Record Number:  [id number] Patient Account Number: [id number] Date of Birth/Sex: [birth year] (81 y.o. Female) Treating RN: Cornell Barman Primary Care Physician: Ria Bush Other Clinician: Referring Physician: Ria Bush Treating Physician/Extender: Frann Rider in Treatment: 2 Vital Signs Time Taken: 15:05 Temperature (F): 97.7 Height (in): 64 Pulse (bpm): 59 Weight (lbs): 146 Respiratory Rate (breaths/min): 18 Body Mass Index (BMI): 25.1 Blood Pressure (mmHg): 123/59 Reference Range: 80 - 120 mg / dl Electronic Signature(s) Signed: 11/01/2014 5:02:25 PM By: Gretta Cool, RN, BSN, Kim RN, BSN Entered By: Gretta Cool, RN, BSN, Kim on 11/01/2014 15:06:15
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
## 2014-11-03 MED ORDER — HYDROCODONE-ACETAMINOPHEN 5-325 MG PO TABS: ORAL_TABLET | ORAL | Status: DC

## 2014-11-03 NOTE — Telephone Encounter (Signed)
Patient advised.  Rx left at front desk for pick up. 

## 2014-11-03 NOTE — Telephone Encounter (Signed)
Printed. F/u with PCP if still needing pain medicine at the end of this rx. Thanks.

## 2014-11-08 ENCOUNTER — Encounter: Payer: Medicare Other | Attending: Surgery | Admitting: Surgery

## 2014-11-08 DIAGNOSIS — S8781XA Crushing injury of right lower leg, initial encounter: Secondary | ICD-10-CM | POA: Diagnosis not present

## 2014-11-08 DIAGNOSIS — Z7901 Long term (current) use of anticoagulants: Secondary | ICD-10-CM | POA: Diagnosis not present

## 2014-11-08 DIAGNOSIS — I4891 Unspecified atrial fibrillation: Secondary | ICD-10-CM | POA: Insufficient documentation

## 2014-11-08 DIAGNOSIS — M810 Age-related osteoporosis without current pathological fracture: Secondary | ICD-10-CM | POA: Diagnosis not present

## 2014-11-08 DIAGNOSIS — K219 Gastro-esophageal reflux disease without esophagitis: Secondary | ICD-10-CM | POA: Diagnosis not present

## 2014-11-08 DIAGNOSIS — X58XXXA Exposure to other specified factors, initial encounter: Secondary | ICD-10-CM | POA: Insufficient documentation

## 2014-11-08 DIAGNOSIS — I10 Essential (primary) hypertension: Secondary | ICD-10-CM | POA: Insufficient documentation

## 2014-11-08 DIAGNOSIS — S80811A Abrasion, right lower leg, initial encounter: Secondary | ICD-10-CM | POA: Insufficient documentation

## 2014-11-08 NOTE — Progress Notes (Signed)
[name], [name] ([id number]) Visit Report for 11/08/2014 Arrival Information Details Patient Name: [name], [name]. Date of Service: 11/08/2014 9:30 AM Medical Record Number: [id number] Patient Account Number: [id number] Date of Birth/Sex: 01-09-1930 (81 y.o. Female) Treating RN: Afful, RN, BSN, Velva Harman Primary Care Physician: Ria Bush Other Clinician: Referring Physician: Ria Bush Treating Physician/Extender: Frann Rider in Treatment: 3 Visit Information History Since Last Visit Any new allergies or adverse reactions: No Patient Arrived: Ambulatory Had a fall or experienced change in No Arrival Time: 09:34 activities of daily living that may affect Accompanied By: son inlaw risk of falls: Transfer Assistance: None Signs or symptoms of abuse/neglect since last No Patient Identification Verified: Yes visito Secondary Verification Process Yes Hospitalized since last visit: No Completed: Has Dressing in Place as Prescribed: Yes Patient Has Alerts: Yes Pain Present Now: No Patient Alerts: Patient on Blood Thinner ABI L 0.89 Electronic Signature(s) Signed: 11/08/2014 9:34:46 AM By: Regan Lemming BSN, RN Entered By: Regan Lemming on 11/08/2014 09:34:45 [name] ([id number]) -------------------------------------------------------------------------------- Encounter Discharge Information Details Patient Name: [name]. Date of Service: 11/08/2014 9:30 AM Medical Record Number: [id number] Patient Account Number: [id number] Date of Birth/Sex: 01-May-1929 (81 y.o. Female) Treating RN: Afful, RN, BSN, Velva Harman Primary Care Physician: Ria Bush Other Clinician: Referring Physician: Ria Bush Treating Physician/Extender: Frann Rider in Treatment: 3 Encounter Discharge Information Items Discharge Pain Level: 0 Discharge Condition: Stable Ambulatory Status: Ambulatory Discharge Destination: Home Transportation: Private  Auto Accompanied By: soninlaw Schedule Follow-up Appointment: No Medication Reconciliation completed and provided to Patient/Care No Neeraj Housand: Provided on Clinical Summary of Care: 11/08/2014 Form Type Recipient Paper Patient CM Electronic Signature(s) Signed: 11/08/2014 10:13:30 AM By: Ruthine Dose Previous Signature: 11/08/2014 10:12:20 AM Version By: Regan Lemming BSN, RN Entered By: Ruthine Dose on 11/08/2014 10:13:30 [name] ([id number]) -------------------------------------------------------------------------------- Lower Extremity Assessment Details Patient Name: [name]. Date of Service: 11/08/2014 9:30 AM Medical Record Number: [id number] Patient Account Number: [id number] Date of Birth/Sex: 1929/10/08 (81 y.o. Female) Treating RN: Afful, RN, BSN, Velva Harman Primary Care Physician: Ria Bush Other Clinician: Referring Physician: Ria Bush Treating Physician/Extender: Frann Rider in Treatment: 3 Edema Assessment Assessed: Shirlyn Goltz: No] [Right: No] E[Left: dema] [Right: :] Calf Left: Right: Point of Measurement: 31 cm From Medial Instep cm 32.1 cm Ankle Left: Right: Point of Measurement: 10 cm From Medial Instep cm 19.8 cm Vascular Assessment Pulses: Posterior Tibial Dorsalis Pedis Palpable: [Right:Yes] Extremity colors, hair growth, and conditions: Extremity Color: [Right:Normal] Hair Growth on Extremity: [Right:No] Temperature of Extremity: [Right:Warm] Capillary Refill: [Right:< 3 seconds] Toe Nail Assessment Left: Right: Thick: No Discolored: No Deformed: No Improper Length and Hygiene: No Electronic Signature(s) Signed: 11/08/2014 9:39:02 AM By: Regan Lemming BSN, RN Entered By: Regan Lemming on 11/08/2014 09:39:02 [name] ([id number]) -------------------------------------------------------------------------------- Multi Wound Chart Details Patient Name: [name]. Date of Service: 11/08/2014 9:30 AM Medical  Record Number: [id number] Patient Account Number: [id number] Date of Birth/Sex: Apr 23, 1929 (81 y.o. Female) Treating RN: Baruch Gouty, RN, BSN, Velva Harman Primary Care Physician: Ria Bush Other Clinician: Referring Physician: Ria Bush Treating Physician/Extender: Frann Rider in Treatment: 3 Vital Signs Height(in): 64 Pulse(bpm): 61 Weight(lbs): 146 Blood Pressure 149/61 (mmHg): Body Mass Index(BMI): 25 Temperature(F): 97.8 Respiratory Rate 18 (breaths/min): Photos: [1:No Photos] [2:No Photos] [N/A:N/A] Wound Location: [1:Right Lower Leg] [2:Right Malleolus - Lateral] [N/A:N/A] Wounding Event: [1:Trauma] [2:Trauma] [N/A:N/A] Primary Etiology: [1:Trauma, Other] [2:Trauma, Other] [N/A:N/A] Comorbid History: [1:Hypertension, Osteoarthritis] [2:Hypertension, Osteoarthritis] [N/A:N/A] Date Acquired: [1:10/07/2014] [2:10/07/2014] [N/A:N/A] Weeks of Treatment: [1:3] [  2:3] [N/A:N/A] Wound Status: [1:Open] [2:Healed - Epithelialized] [N/A:N/A] Measurements L x W x D 0.1x0.1x0.1 [2:0x0x0] [N/A:N/A] (cm) Area (cm) : [1:0.008] [2:0] [N/A:N/A] Volume (cm) : [1:0.001] [2:0] [N/A:N/A] % Reduction in Area: [1:100.00%] [2:100.00%] [N/A:N/A] % Reduction in Volume: 100.00% [2:100.00%] [N/A:N/A] Classification: [1:Partial Thickness] [2:Partial Thickness] [N/A:N/A] Exudate Amount: [1:None Present] [2:None Present] [N/A:N/A] Wound Margin: [1:Flat and Intact] [2:Flat and Intact] [N/A:N/A] Granulation Amount: [1:None Present (0%)] [2:None Present (0%)] [N/A:N/A] Necrotic Amount: [1:Medium (34-66%)] [2:None Present (0%)] [N/A:N/A] Necrotic Tissue: [1:Eschar] [2:N/A] [N/A:N/A] Exposed Structures: [1:Fascia: No Fat: No Tendon: No Muscle: No Joint: No Bone: No Limited to Skin Breakdown] [2:Fascia: No Fat: No Tendon: No Muscle: No Joint: No Bone: No Limited to Skin Breakdown] [N/A:N/A] Epithelialization: [1:Large (67-100%)] [2:Large (67-100%)] [N/A:N/A] Periwound Skin Texture: Edema: Yes  Rash: Yes N/A Induration: Yes Edema: No Excoriation: No Excoriation: No Callus: No Induration: No Crepitus: No Callus: No Fluctuance: No Crepitus: No Friable: No Fluctuance: No Rash: No Friable: No Scarring: No Scarring: No Periwound Skin Dry/Scaly: Yes Dry/Scaly: Yes N/A Moisture: Maceration: No Maceration: No Moist: No Moist: No Periwound Skin Color: Atrophie Blanche: No Atrophie Blanche: No N/A Cyanosis: No Cyanosis: No Ecchymosis: No Ecchymosis: No Erythema: No Erythema: No Hemosiderin Staining: No Hemosiderin Staining: No Mottled: No Mottled: No Pallor: No Pallor: No Rubor: No Rubor: No Temperature: No Abnormality No Abnormality N/A Tenderness on Yes No N/A Palpation: Wound Preparation: Ulcer Cleansing: Ulcer Cleansing: N/A Rinsed/Irrigated with Rinsed/Irrigated with Saline Saline Topical Anesthetic Applied: Other: lidocaine 4% Treatment Notes Electronic Signature(s) Signed: 11/08/2014 9:59:40 AM By: Regan Lemming BSN, RN Entered By: Regan Lemming on 11/08/2014 09:59:40 [name] ([id number]) -------------------------------------------------------------------------------- [place] Details Patient Name: [name], [name]. Date of Service: 11/08/2014 9:30 AM Medical Record Number: [id number] Patient Account Number: [id number] Date of Birth/Sex: January 21, 1930 (81 y.o. Female) Treating RN: Afful, RN, BSN, Velva Harman Primary Care Physician: Ria Bush Other Clinician: Referring Physician: Ria Bush Treating Physician/Extender: Frann Rider in Treatment: 3 Active Inactive Abuse / Safety / Falls / Self Care Management Nursing Diagnoses: Potential for falls Goals: Patient will remain injury free Date Initiated: 10/18/2014 Goal Status: Active Interventions: Assess fall risk on admission and as needed Notes: Orientation to the Wound Care Program Nursing Diagnoses: Knowledge deficit related to the wound healing  center program Goals: Patient/caregiver will verbalize understanding of the [facility] Date Initiated: 10/18/2014 Goal Status: Active Interventions: Provide education on orientation to the wound center Notes: Wound/Skin Impairment Nursing Diagnoses: Impaired tissue integrity Goals: Ulcer/skin breakdown will heal within 14 weeks Date Initiated: 10/18/2014 [name], [name] ([id number]) Goal Status: Active Interventions: Assess ulceration(s) every visit Notes: Electronic Signature(s) Signed: 11/08/2014 9:59:28 AM By: Regan Lemming BSN, RN Entered By: Regan Lemming on 11/08/2014 09:59:28 [name] ([id number]) -------------------------------------------------------------------------------- Pain Assessment Details Patient Name: [name]. Date of Service: 11/08/2014 9:30 AM Medical Record Number: [id number] Patient Account Number: [id number] Date of Birth/Sex: 1930-01-26 (81 y.o. Female) Treating RN: Baruch Gouty, RN, BSN, Velva Harman Primary Care Physician: Ria Bush Other Clinician: Referring Physician: Ria Bush Treating Physician/Extender: Frann Rider in Treatment: 3 Active Problems Location of Pain Severity and Description of Pain Patient Has Paino No Site Locations Pain Management and Medication Current Pain Management: Electronic Signature(s) Signed: 11/08/2014 9:37:42 AM By: Regan Lemming BSN, RN Entered By: Regan Lemming on 11/08/2014 09:37:41 [name] ([id number]) -------------------------------------------------------------------------------- Patient/Caregiver Education Details Patient Name: [name] Date of Service: 11/08/2014 9:30 AM Medical Record Number: [id number] Patient Account Number: [id number] Date of Birth/Gender:  1929/12/23 (81 y.o. Female) Treating RN: Baruch Gouty, RN, BSN, Velva Harman Primary Care Physician: Ria Bush Other Clinician: Referring Physician: Ria Bush Treating Physician/Extender: Frann Rider in Treatment: 3 Education Assessment Education Provided To: Patient Education Topics Provided Welcome To [facility]: Methods: Explain/Verbal Responses: State content correctly Electronic Signature(s) Signed: 11/08/2014 10:12:28 AM By: Regan Lemming BSN, RN Entered By: Regan Lemming on 11/08/2014 10:12:28 [name] ([id number]) -------------------------------------------------------------------------------- Wound Assessment Details Patient Name: [name]. Date of Service: 11/08/2014 9:30 AM Medical Record Number: [id number] Patient Account Number: [id number] Date of Birth/Sex: [birth year] (81 y.o. Female) Treating RN: Afful, RN, BSN, Velva Harman Primary Care Physician: Ria Bush Other Clinician: Referring Physician: Ria Bush Treating Physician/Extender: Frann Rider in Treatment: 3 Wound Status Wound Number: 1 Primary Etiology: Trauma, Other Wound Location: Right Lower Leg Wound Status: Open Wounding Event: Trauma Comorbid History: Hypertension, Osteoarthritis Date Acquired: 10/07/2014 Weeks Of Treatment: 3 Clustered Wound: No Wound Measurements Length: (cm) 0.1 Width: (cm) 0.1 Depth: (cm) 0.1 Area: (cm) 0.008 Volume: (cm) 0.001 % Reduction in Area: 100% % Reduction in Volume: 100% Epithelialization: Large (67-100%) Tunneling: No Undermining: No Wound Description Classification: Partial Thickness Wound Margin: Flat and Intact Exudate Amount: None Present Foul Odor After Cleansing: No Wound Bed Granulation Amount: None Present (0%) Exposed Structure Necrotic Amount: Medium (34-66%) Fascia Exposed: No Necrotic Quality: Eschar Fat Layer Exposed: No Tendon Exposed: No Muscle Exposed: No Joint Exposed: No Bone Exposed: No Limited to Skin Breakdown Periwound Skin Texture Texture Color No Abnormalities Noted: No No Abnormalities Noted: No Callus: No Atrophie Blanche: No Crepitus: No Cyanosis: No Excoriation:  No Ecchymosis: No Fluctuance: No Erythema: No Friable: No Hemosiderin Staining: No Induration: Yes Mottled: No [name], [name]. ([id number]) Localized Edema: Yes Pallor: No Rash: No Rubor: No Scarring: No Temperature / Pain Moisture Temperature: No Abnormality No Abnormalities Noted: No Tenderness on Palpation: Yes Dry / Scaly: Yes Maceration: No Moist: No Wound Preparation Ulcer Cleansing: Rinsed/Irrigated with Saline Topical Anesthetic Applied: Other: lidocaine 4%, Treatment Notes Wound #1 (Right Lower Leg) 1. Cleansed with: Clean wound with Normal Saline 3. Peri-wound Care: Skin Prep 4. Dressing Applied: Mepitel 5. Secondary Dressing Applied Bordered Foam Dressing Electronic Signature(s) Signed: 11/08/2014 9:43:59 AM By: Regan Lemming BSN, RN Entered By: Regan Lemming on 11/08/2014 09:43:58 [name] ([id number]) -------------------------------------------------------------------------------- Wound Assessment Details Patient Name: [name]. Date of Service: 11/08/2014 9:30 AM Medical Record Number: [id number] Patient Account Number: [id number] Date of Birth/Sex: [birth year] (81 y.o. Female) Treating RN: Afful, RN, BSN, Velva Harman Primary Care Physician: Ria Bush Other Clinician: Referring Physician: Ria Bush Treating Physician/Extender: Frann Rider in Treatment: 3 Wound Status Wound Number: 2 Primary Etiology: Trauma, Other Wound Location: Right Malleolus - Lateral Wound Status: Healed - Epithelialized Wounding Event: Trauma Comorbid History: Hypertension, Osteoarthritis Date Acquired: 10/07/2014 Weeks Of Treatment: 3 Clustered Wound: No Wound Measurements Length: (cm) 0 % Reduction Width: (cm) 0 % Reduction Depth: (cm) 0 Epithelializ Area: (cm) 0 Volume: (cm) 0 in Area: 100% in Volume: 100% ation: Large (67-100%) Wound Description Classification: Partial Thickness Wound Margin: Flat and Intact Exudate Amount: None  Present Foul Odor After Cleansing: No Wound Bed Granulation Amount: None Present (0%) Exposed Structure Necrotic Amount: None Present (0%) Fascia Exposed: No Fat Layer Exposed: No Tendon Exposed: No Muscle Exposed: No Joint Exposed: No Bone Exposed: No Limited to Skin Breakdown Periwound Skin Texture Texture Color No Abnormalities Noted: No No Abnormalities Noted: No Callus: No Atrophie Blanche: No Crepitus: No Cyanosis: No  Excoriation: No Ecchymosis: No Fluctuance: No Erythema: No Friable: No Hemosiderin Staining: No Induration: No Mottled: No [name], [name]. ([id number]) Localized Edema: No Pallor: No Rash: Yes Rubor: No Scarring: No Temperature / Pain Moisture Temperature: No Abnormality No Abnormalities Noted: No Dry / Scaly: Yes Maceration: No Moist: No Wound Preparation Ulcer Cleansing: Rinsed/Irrigated with Saline Electronic Signature(s) Signed: 11/08/2014 9:44:27 AM By: Regan Lemming BSN, RN Entered By: Regan Lemming on 11/08/2014 09:44:27 [name] ([id number]) -------------------------------------------------------------------------------- Vitals Details Patient Name: [name]. Date of Service: 11/08/2014 9:30 AM Medical Record Number: [id number] Patient Account Number: [id number] Date of Birth/Sex: 02-Apr-1930 (81 y.o. Female) Treating RN: Afful, RN, BSN, Velva Harman Primary Care Physician: Ria Bush Other Clinician: Referring Physician: Ria Bush Treating Physician/Extender: Frann Rider in Treatment: 3 Vital Signs Time Taken: 09:37 Temperature (F): 97.8 Height (in): 64 Pulse (bpm): 61 Weight (lbs): 146 Respiratory Rate (breaths/min): 18 Body Mass Index (BMI): 25.1 Blood Pressure (mmHg): 149/61 Reference Range: 80 - 120 mg / dl Electronic Signature(s) Signed: 11/08/2014 9:38:24 AM By: Regan Lemming BSN, RN Entered By: Regan Lemming on 11/08/2014 31:51:76
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
## 2014-11-15 ENCOUNTER — Encounter: Payer: Medicare Other | Admitting: Surgery

## 2014-11-15 DIAGNOSIS — K219 Gastro-esophageal reflux disease without esophagitis: Secondary | ICD-10-CM | POA: Diagnosis not present

## 2014-11-15 DIAGNOSIS — I4891 Unspecified atrial fibrillation: Secondary | ICD-10-CM | POA: Diagnosis not present

## 2014-11-15 DIAGNOSIS — Z7901 Long term (current) use of anticoagulants: Secondary | ICD-10-CM | POA: Diagnosis not present

## 2014-11-15 DIAGNOSIS — S8781XA Crushing injury of right lower leg, initial encounter: Secondary | ICD-10-CM | POA: Diagnosis not present

## 2014-11-15 DIAGNOSIS — M810 Age-related osteoporosis without current pathological fracture: Secondary | ICD-10-CM | POA: Diagnosis not present

## 2014-11-15 DIAGNOSIS — I10 Essential (primary) hypertension: Secondary | ICD-10-CM | POA: Diagnosis not present

## 2014-11-15 DIAGNOSIS — S80811A Abrasion, right lower leg, initial encounter: Secondary | ICD-10-CM | POA: Diagnosis not present
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
## 2014-11-15 NOTE — Progress Notes (Signed)
ZALIA, HAUTALA ([id number]) Visit Report for 11/15/2014 Arrival Information Details Patient Name: [name], [name]. Date of Service: 11/15/2014 10:15 AM Medical Record Number: [id number] Patient Account Number: [id number] Date of Birth/Sex: 23-Jan-1930 (81 y.o. Female) Treating RN: Afful, RN, BSN, Velva Harman Primary Care Physician: Ria Bush Other Clinician: Referring Physician: Ria Bush Treating Physician/Extender: Frann Rider in Treatment: 4 Visit Information History Since Last Visit Any new allergies or adverse reactions: No Patient Arrived: Kasandra Knudsen Had a fall or experienced change in No Arrival Time: 10:13 activities of daily living that may affect Accompanied By: SPOUSE risk of falls: Transfer Assistance: None Signs or symptoms of abuse/neglect since last No Patient Identification Verified: Yes visito Secondary Verification Process Yes Hospitalized since last visit: No Completed: Has Dressing in Place as Prescribed: Yes Patient Has Alerts: Yes Pain Present Now: No Patient Alerts: Patient on Blood Thinner ABI L 0.89 Electronic Signature(s) Signed: 11/15/2014 10:14:09 AM By: Regan Lemming BSN, RN Entered By: Regan Lemming on 11/15/2014 10:14:09 [name] ([id number]) -------------------------------------------------------------------------------- Clinic Level of Care Assessment Details Patient Name: [name] Date of Service: 11/15/2014 10:15 AM Medical Record Number: [id number] Patient Account Number: [id number] Date of Birth/Sex: 08-05-1929 (81 y.o. Female) Treating RN: Afful, RN, BSN, Velva Harman Primary Care Physician: Ria Bush Other Clinician: Referring Physician: Ria Bush Treating Physician/Extender: Frann Rider in Treatment: 4 Clinic Level of Care Assessment Items TOOL 4 Quantity Score []  - Use when only an EandM is performed on FOLLOW-UP visit 0 ASSESSMENTS - Nursing Assessment / Reassessment X - Reassessment of  Co-morbidities (includes updates in patient status) 1 10 X - Reassessment of Adherence to Treatment Plan 1 5 ASSESSMENTS - Wound and Skin Assessment / Reassessment []  - Simple Wound Assessment / Reassessment - one wound 0 []  - Complex Wound Assessment / Reassessment - multiple wounds 0 []  - Dermatologic / Skin Assessment (not related to wound area) 0 ASSESSMENTS - Focused Assessment []  - Circumferential Edema Measurements - multi extremities 0 []  - Nutritional Assessment / Counseling / Intervention 0 []  - Lower Extremity Assessment (monofilament, tuning fork, pulses) 0 []  - Peripheral Arterial Disease Assessment (using hand held doppler) 0 ASSESSMENTS - Ostomy and/or Continence Assessment and Care []  - Incontinence Assessment and Management 0 []  - Ostomy Care Assessment and Management (repouching, etc.) 0 PROCESS - Coordination of Care X - Simple Patient / Family Education for ongoing care 1 15 []  - Complex (extensive) Patient / Family Education for ongoing care 0 []  - Staff obtains Programmer, systems, Records, Test Results / Process Orders 0 []  - Staff telephones HHA, Nursing Homes / Clarify orders / etc 0 []  - Routine Transfer to another Facility (non-emergent condition) 0 [name], BEVEL. ([id number]) []  - Routine Hospital Admission (non-emergent condition) 0 []  - New Admissions / Biomedical engineer / Ordering NPWT, Apligraf, etc. 0 []  - Emergency Hospital Admission (emergent condition) 0 X - Simple Discharge Coordination 1 10 []  - Complex (extensive) Discharge Coordination 0 PROCESS - Special Needs []  - Pediatric / Minor Patient Management 0 []  - Isolation Patient Management 0 []  - Hearing / Language / Visual special needs 0 []  - Assessment of Community assistance (transportation, D/C planning, etc.) 0 []  - Additional assistance / Altered mentation 0 []  - Support Surface(s) Assessment (bed, cushion, seat, etc.) 0 INTERVENTIONS - Wound Cleansing / Measurement []  - Simple Wound  Cleansing - one wound 0 []  - Complex Wound Cleansing - multiple wounds 0 X - Wound Imaging (photographs - any number of wounds) 1 5 []  -  Wound Tracing (instead of photographs) 0 []  - Simple Wound Measurement - one wound 0 []  - Complex Wound Measurement - multiple wounds 0 INTERVENTIONS - Wound Dressings []  - Small Wound Dressing one or multiple wounds 0 []  - Medium Wound Dressing one or multiple wounds 0 []  - Large Wound Dressing one or multiple wounds 0 []  - Application of Medications - topical 0 []  - Application of Medications - injection 0 INTERVENTIONS - Miscellaneous []  - External ear exam 0 [name], GANCI. ([id number]) []  - Specimen Collection (cultures, biopsies, blood, body fluids, etc.) 0 []  - Specimen(s) / Culture(s) sent or taken to Lab for analysis 0 []  - Patient Transfer (multiple staff / Harrel Lemon Lift / Similar devices) 0 []  - Simple Staple / Suture removal (25 or less) 0 []  - Complex Staple / Suture removal (26 or more) 0 []  - Hypo / Hyperglycemic Management (close monitor of Blood Glucose) 0 []  - Ankle / Brachial Index (ABI) - do not check if billed separately 0 []  - Vital Signs 0 Has the patient been seen at the hospital within the last three years: Yes Total Score: 45 Level Of Care: New/Established - Level 2 Electronic Signature(s) Signed: 11/15/2014 10:29:18 AM By: Regan Lemming BSN, RN Entered By: Regan Lemming on 11/15/2014 10:29:17 [name] ([id number]) -------------------------------------------------------------------------------- Encounter Discharge Information Details Patient Name: [name]. Date of Service: 11/15/2014 10:15 AM Medical Record Number: [id number] Patient Account Number: [id number] Date of Birth/Sex: 1929-11-02 (81 y.o. Female) Treating RN: Baruch Gouty, RN, BSN, Velva Harman Primary Care Physician: Ria Bush Other Clinician: Referring Physician: Ria Bush Treating Physician/Extender: Frann Rider in Treatment: 4 Encounter  Discharge Information Items Discharge Pain Level: 0 Discharge Condition: Stable Ambulatory Status: Cane Discharge Destination: Home Private Transportation: Auto Accompanied By: spouse Schedule Follow-up Appointment: No Medication Reconciliation completed and No provided to Patient/Care Christiane Sistare: Patient Clinical Summary of Care: Declined Electronic Signature(s) Signed: 11/15/2014 11:11:04 AM By: Ruthine Dose Previous Signature: 11/15/2014 10:27:27 AM Version By: Regan Lemming BSN, RN Entered By: Ruthine Dose on 11/15/2014 11:11:04 [name] ([id number]) -------------------------------------------------------------------------------- Lower Extremity Assessment Details Patient Name: [name]. Date of Service: 11/15/2014 10:15 AM Medical Record Number: [id number] Patient Account Number: [id number] Date of Birth/Sex: Dec 13, 1929 (81 y.o. Female) Treating RN: Afful, RN, BSN, Velva Harman Primary Care Physician: Ria Bush Other Clinician: Referring Physician: Ria Bush Treating Physician/Extender: Frann Rider in Treatment: 4 Edema Assessment Assessed: [name]: No] [Right: No] E[Left: dema] [Right: :] Calf Left: Right: Point of Measurement: 31 cm From Medial Instep cm 32.2 cm Ankle Left: Right: Point of Measurement: 10 cm From Medial Instep cm 19.8 cm Vascular Assessment Claudication: Claudication Assessment [Right:None] Pulses: Posterior Tibial Dorsalis Pedis Palpable: [Right:Yes] Extremity colors, hair growth, and conditions: Extremity Color: [Right:Normal] Hair Growth on Extremity: [Right:No] Temperature of Extremity: [Right:Warm] Capillary Refill: [Right:< 3 seconds] Toe Nail Assessment Left: Right: Thick: No Discolored: No Deformed: No Improper Length and Hygiene: No Electronic Signature(s) Signed: 11/15/2014 10:18:13 AM By: Regan Lemming BSN, RN Entered By: Regan Lemming on 11/15/2014 10:18:13 [name] ([id number] Payne, [name]  ([id number]) -------------------------------------------------------------------------------- Multi Wound Chart Details Patient Name: [name]. Date of Service: 11/15/2014 10:15 AM Medical Record Number: [id number] Patient Account Number: [id number] Date of Birth/Sex: Mar 06, 1930 (81 y.o. Female) Treating RN: Baruch Gouty, RN, BSN, Velva Harman Primary Care Physician: Ria Bush Other Clinician: Referring Physician: Ria Bush Treating Physician/Extender: Frann Rider in Treatment: 4 Vital Signs Height(in): 64 Pulse(bpm): 66 Weight(lbs): 146 Blood Pressure 142/68 (mmHg): Body Mass Index(BMI): 25  Temperature(F): 98.2 Respiratory Rate 18 (breaths/min): Photos: [1:No Photos] [N/A:N/A] Wound Location: [1:Right Lower Leg] [N/A:N/A] Wounding Event: [1:Trauma] [N/A:N/A] Primary Etiology: [1:Trauma, Other] [N/A:N/A] Comorbid History: [1:Hypertension, Osteoarthritis] [N/A:N/A] Date Acquired: [1:10/07/2014] [N/A:N/A] Weeks of Treatment: [1:4] [N/A:N/A] Wound Status: [1:Open] [N/A:N/A] Measurements L x W x D 0x0x0 [N/A:N/A] (cm) Area (cm) : [1:0] [N/A:N/A] Volume (cm) : [1:0] [N/A:N/A] % Reduction in Area: [1:100.00%] [N/A:N/A] % Reduction in Volume: 100.00% [N/A:N/A] Classification: [1:Partial Thickness] [N/A:N/A] Exudate Amount: [1:None Present] [N/A:N/A] Wound Margin: [1:Flat and Intact] [N/A:N/A] Granulation Amount: [1:None Present (0%)] [N/A:N/A] Necrotic Amount: [1:None Present (0%)] [N/A:N/A] Exposed Structures: [1:Fascia: No Fat: No Tendon: No Muscle: No Joint: No Bone: No Limited to Skin Breakdown] [N/A:N/A] Epithelialization: [1:Large (67-100%)] [N/A:N/A] Periwound Skin Texture: [N/A:N/A] Edema: Yes Induration: Yes Excoriation: No Callus: No Crepitus: No Fluctuance: No Friable: No Rash: No Scarring: No Periwound Skin Dry/Scaly: Yes N/A N/A Moisture: Maceration: No Moist: No Periwound Skin Color: Atrophie Blanche: No N/A N/A Cyanosis:  No Ecchymosis: No Erythema: No Hemosiderin Staining: No Mottled: No Pallor: No Rubor: No Temperature: No Abnormality N/A N/A Tenderness on Yes N/A N/A Palpation: Wound Preparation: Ulcer Cleansing: N/A N/A Rinsed/Irrigated with Saline Topical Anesthetic Applied: Other: lidocaine 4% Treatment Notes Electronic Signature(s) Signed: 11/15/2014 10:22:01 AM By: Regan Lemming BSN, RN Entered By: Regan Lemming on 11/15/2014 10:22:01 [name] ([id number]) -------------------------------------------------------------------------------- [place] Details Patient Name: [name], CARRELL. Date of Service: 11/15/2014 10:15 AM Medical Record Number: [id number] Patient Account Number: [id number] Date of Birth/Sex: Feb 19, 1930 (81 y.o. Female) Treating RN: Afful, RN, BSN, Velva Harman Primary Care Physician: Ria Bush Other Clinician: Referring Physician: Ria Bush Treating Physician/Extender: Frann Rider in Treatment: 4 Active Inactive Electronic Signature(s) Signed: 11/15/2014 10:28:47 AM By: Regan Lemming BSN, RN Previous Signature: 11/15/2014 10:21:44 AM Version By: Regan Lemming BSN, RN Entered By: Regan Lemming on 11/15/2014 10:28:46 [name] ([id number]) -------------------------------------------------------------------------------- Pain Assessment Details Patient Name: [name], PENDERGRAFT. Date of Service: 11/15/2014 10:15 AM Medical Record Number: [id number] Patient Account Number: [id number] Date of Birth/Sex: 1929/10/02 (81 y.o. Female) Treating RN: Baruch Gouty, RN, BSN, Velva Harman Primary Care Physician: Ria Bush Other Clinician: Referring Physician: Ria Bush Treating Physician/Extender: Frann Rider in Treatment: 4 Active Problems Location of Pain Severity and Description of Pain Patient Has Paino No Site Locations Pain Management and Medication Current Pain Management: Electronic Signature(s) Signed: 11/15/2014 10:16:02 AM By:  Regan Lemming BSN, RN Entered By: Regan Lemming on 11/15/2014 10:16:02 [name] ([id number]) -------------------------------------------------------------------------------- Patient/Caregiver Education Details Patient Name: [name] Date of Service: 11/15/2014 10:15 AM Medical Record Number: [id number] Patient Account Number: [id number] Date of Birth/Gender: [birth year] (81 y.o. Female) Treating RN: Afful, RN, BSN, Velva Harman Primary Care Physician: Ria Bush Other Clinician: Referring Physician: Ria Bush Treating Physician/Extender: Frann Rider in Treatment: 4 Education Assessment Education Provided To: Patient Education Topics Provided Welcome To [facility]: Methods: Explain/Verbal Responses: State content correctly Electronic Signature(s) Signed: 11/15/2014 10:27:39 AM By: Regan Lemming BSN, RN Entered By: Regan Lemming on 11/15/2014 10:27:39 [name] ([id number]) -------------------------------------------------------------------------------- Wound Assessment Details Patient Name: [name] Date of Service: 11/15/2014 10:15 AM Medical Record Number: [id number] Patient Account Number: [id number] Date of Birth/Sex: 04/06/1930 (81 y.o. Female) Treating RN: Afful, RN, BSN, Velva Harman Primary Care Physician: Ria Bush Other Clinician: Referring Physician: Ria Bush Treating Physician/Extender: Frann Rider in Treatment: 4 Wound Status Wound Number: 1 Primary Etiology: Trauma, Other Wound Location: Right Lower Leg Wound Status: Open Wounding Event: Trauma Comorbid History: Hypertension, Osteoarthritis  Date Acquired: 10/07/2014 Weeks Of Treatment: 4 Clustered Wound: No Photos Photo Uploaded By: Regan Lemming on 11/15/2014 16:29:10 Wound Measurements Length: (cm) 0 % Reduction Width: (cm) 0 % Reduction Depth: (cm) 0 Epithelializ Area: (cm) 0 Volume: (cm) 0 in Area: 100% in Volume: 100% ation: Large  (67-100%) Wound Description Classification: Partial Thickness Wound Margin: Flat and Intact Exudate Amount: None Present Foul Odor After Cleansing: No Wound Bed Granulation Amount: None Present (0%) Exposed Structure Necrotic Amount: None Present (0%) Fascia Exposed: No Fat Layer Exposed: No Tendon Exposed: No Muscle Exposed: No Joint Exposed: No [name], Crystal S. ([id number]) Bone Exposed: No Limited to Skin Breakdown Periwound Skin Texture Texture Color No Abnormalities Noted: No No Abnormalities Noted: No Callus: No Atrophie Blanche: No Crepitus: No Cyanosis: No Excoriation: No Ecchymosis: No Fluctuance: No Erythema: No Friable: No Hemosiderin Staining: No Induration: Yes Mottled: No Localized Edema: Yes Pallor: No Rash: No Rubor: No Scarring: No Temperature / Pain Moisture Temperature: No Abnormality No Abnormalities Noted: No Tenderness on Palpation: Yes Dry / Scaly: Yes Maceration: No Moist: No Wound Preparation Ulcer Cleansing: Rinsed/Irrigated with Saline Topical Anesthetic Applied: Other: lidocaine 4%, Electronic Signature(s) Signed: 11/15/2014 10:20:51 AM By: Regan Lemming BSN, RN Entered By: Regan Lemming on 11/15/2014 10:20:51 [name] ([id number]) -------------------------------------------------------------------------------- Vitals Details Patient Name: [name]. Date of Service: 11/15/2014 10:15 AM Medical Record Number: [id number] Patient Account Number: [id number] Date of Birth/Sex: 1930-02-19 (81 y.o. Female) Treating RN: Afful, RN, BSN, Velva Harman Primary Care Physician: Ria Bush Other Clinician: Referring Physician: Ria Bush Treating Physician/Extender: Frann Rider in Treatment: 4 Vital Signs Time Taken: 10:15 Temperature (F): 98.2 Height (in): 64 Pulse (bpm): 66 Weight (lbs): 146 Respiratory Rate (breaths/min): 18 Body Mass Index (BMI): 25.1 Blood Pressure (mmHg): 142/68 Reference Range: 80 - 120  mg / dl Electronic Signature(s) Signed: 11/15/2014 10:17:10 AM By: Regan Lemming BSN, RN Entered By: Regan Lemming on 11/15/2014 10:17:10

## 2014-11-16 ENCOUNTER — Ambulatory Visit (INDEPENDENT_AMBULATORY_CARE_PROVIDER_SITE_OTHER): Payer: Medicare Other | Admitting: *Deleted

## 2014-11-16 DIAGNOSIS — Z5181 Encounter for therapeutic drug level monitoring: Secondary | ICD-10-CM

## 2014-11-16 DIAGNOSIS — I4892 Unspecified atrial flutter: Secondary | ICD-10-CM | POA: Diagnosis not present

## 2014-11-16 LAB — POCT INR: INR: 3.6

## 2014-11-21 DIAGNOSIS — B0233 Zoster keratitis: Secondary | ICD-10-CM | POA: Diagnosis not present

## 2014-11-29 ENCOUNTER — Encounter: Payer: Self-pay | Admitting: Family Medicine

## 2014-11-29 ENCOUNTER — Ambulatory Visit (INDEPENDENT_AMBULATORY_CARE_PROVIDER_SITE_OTHER)
Admission: RE | Admit: 2014-11-29 | Discharge: 2014-11-29 | Disposition: A | Discharge status: Home / Self Care | Payer: Medicare Other | Source: Ambulatory Visit | Attending: Family Medicine | Admitting: Family Medicine

## 2014-11-29 ENCOUNTER — Ambulatory Visit (INDEPENDENT_AMBULATORY_CARE_PROVIDER_SITE_OTHER): Payer: Medicare Other | Admitting: Family Medicine

## 2014-11-29 VITALS — BP 138/80 | HR 84 | Temp 98.2°F | Wt 146.0 lb

## 2014-11-29 DIAGNOSIS — M25571 Pain in right ankle and joints of right foot: Secondary | ICD-10-CM

## 2014-11-29 DIAGNOSIS — L03115 Cellulitis of right lower limb: Secondary | ICD-10-CM | POA: Insufficient documentation

## 2014-11-29 DIAGNOSIS — M7989 Other specified soft tissue disorders: Secondary | ICD-10-CM | POA: Diagnosis not present

## 2014-11-29 LAB — BASIC METABOLIC PANEL
BUN: 13 mg/dL (ref 6–23)
CO2: 29 mEq/L (ref 19–32)
CREATININE: 0.73 mg/dL (ref 0.40–1.20)
Calcium: 9.7 mg/dL (ref 8.4–10.5)
Chloride: 101 mEq/L (ref 96–112)
GFR: 80.83 mL/min (ref >60.00)
Glucose, Bld: 88 mg/dL (ref 70–99)
POTASSIUM: 3.6 meq/L (ref 3.5–5.1)
Sodium: 138 mEq/L (ref 135–145)

## 2014-11-29 LAB — CBC WITH DIFFERENTIAL/PLATELET
Basophils Absolute: 0.1 10*3/uL (ref 0.0–0.1)
Basophils Relative: 1.1 % (ref 0.0–3.0)
Eosinophils Absolute: 0.1 10*3/uL (ref 0.0–0.7)
Eosinophils Relative: 2.7 % (ref 0.0–5.0)
HCT: 38.4 % (ref 36.0–46.0)
HEMOGLOBIN: 13 g/dL (ref 12.0–15.0)
LYMPHS ABS: 1.7 10*3/uL (ref 0.7–4.0)
Lymphocytes Relative: 35.3 % (ref 12.0–46.0)
MCHC: 33.9 g/dL (ref 30.0–36.0)
MCV: 92.6 fl (ref 78.0–100.0)
MONOS PCT: 8.6 % (ref 3.0–12.0)
Monocytes Absolute: 0.4 10*3/uL (ref 0.1–1.0)
Neutro Abs: 2.6 10*3/uL (ref 1.4–7.7)
Neutrophils Relative %: 52.3 % (ref 43.0–77.0)
PLATELETS: 245 10*3/uL (ref 150.0–400.0)
RBC: 4.15 Mil/uL (ref 3.87–5.11)
RDW: 13.9 % (ref 11.5–15.5)
WBC: 4.9 10*3/uL (ref 4.0–10.5)

## 2014-11-29 LAB — URIC ACID: Uric Acid, Serum: 3.9 mg/dL (ref 2.4–7.0)

## 2014-11-29 LAB — SEDIMENTATION RATE: Sed Rate: 13 mm/hr (ref 0–22)

## 2014-11-29 MED ORDER — CEPHALEXIN 250 MG PO CAPS: 250.0000 mg | ORAL_CAPSULE | Freq: Four times a day (QID) | ORAL | Status: DC

## 2014-11-29 NOTE — Patient Instructions (Addendum)
labwork today to check for infection. Xray today. I'm worried about infection, but possibly of skin. Start keflex 250mg  four times a day. Let us know if not improving with treatment. Come in for recheck in 3 days, sooner if worsening.

## 2014-11-29 NOTE — Progress Notes (Signed)
BP 138/80 mmHg  Pulse 84  Temp(Src) 98.2 F (36.8 C) (Oral)  Wt 146 lb (66.225 kg)  SpO2 99%   CC: ankle pain  Subjective:    Patient ID: [name], female    DOB: 09/30/1929, 81 y.o.   MRN: [id number]  HPI: [name] is a 81 y.o. female presenting on 11/29/2014 for Ankle Pain   Recent crushing injury to R leg. Large hematoma and blisters due to this. Established with wound clinic. Last visit we started keflex concern for possible cellulitis and placed her on hydrocodone for breakthrough pain. This has resolved. Overall improving. Released from wound clinic.   Recent exposure to bat in house 2 wks ago. Denies bat bite or prolonged exposure. May have twisted R ankle while she was getting bat out with broom but didn't notice significant problem until yesterday when granddaughter's shoe accidentally hit pt's leg at previous site of crush wound injury. More tender and swollen since then. Painful throughout ankle. Tender, swelling, warmth and mild redness noted. No h/o gout. No h/o inflammatory arthritis.   Lab Results  Component Value Date   INR 3.6 11/16/2014   INR 2.4 10/19/2014   INR 1.8 10/12/2014  chronic coumadin for h/o afib. Has f/u appt with coumadin clinic tomorrow. Tolerated keflex well despite PCN allergy.  Denies fevers or nausea.   Relevant past medical, surgical, family and social history reviewed and updated as indicated. Interim medical history since our last visit reviewed. Allergies and medications reviewed and updated. Current Outpatient Prescriptions on File Prior to Visit  Medication Sig  . acetaminophen (TYLENOL) 500 MG tablet Take 500 mg by mouth every 6 (six) hours as needed for moderate pain.   Marland Kitchen acyclovir (ZOVIRAX) 400 MG tablet Take 400 mg by mouth daily.    Marland Kitchen atorvastatin (LIPITOR) 20 MG tablet Take 20 mg by mouth daily.  Marland Kitchen atorvastatin (LIPITOR) 20 MG tablet TAKE 1 TABLET EVERY DAY  . calcium-vitamin D (OSCAL WITH D) 500-200 MG-UNIT per  tablet Take 1 tablet by mouth daily.    Marland Kitchen diltiazem (CARDIZEM CD) 120 MG 24 hr capsule Take 120 mg by mouth every evening.   . ferrous sulfate 325 (65 FE) MG tablet Take 1 tablet (325 mg total) by mouth daily with breakfast.  . hydrochlorothiazide (MICROZIDE) 12.5 MG capsule TAKE 1 CAPSULE BY MOUTH ONCE DAILY  . metoprolol tartrate (LOPRESSOR) 25 MG tablet Take 25 mg by mouth 2 (two) times daily.  . pantoprazole (PROTONIX) 40 MG tablet Take 40 mg by mouth daily.  . prednisoLONE acetate (PRED FORTE) 1 % ophthalmic suspension Place 1 drop into the left eye daily.   Marland Kitchen rOPINIRole (REQUIP) 1 MG tablet Take 2 mg by mouth at bedtime.  . vitamin B-12 (CYANOCOBALAMIN) 1000 MCG tablet Take 1,000 mcg by mouth daily.  Marland Kitchen warfarin (COUMADIN) 5 MG tablet Take 2.5-5 mg by mouth daily. Pt takes one tablet on Sunday, Monday, Wednesday, and Friday.   Pt takes one-half tablet on Tuesday, Thursday, and Saturday.  . warfarin (COUMADIN) 5 MG tablet TAKE ONE TABLET EVERY DAY  . HYDROcodone-acetaminophen (NORCO/VICODIN) 5-325 MG per tablet TAKE ONE TABLET BY MOUTH 3 TIMES DAILY AS NEEDED FOR MODERATE PAIN (Patient not taking: Reported on 11/29/2014)  . ibuprofen (ADVIL,MOTRIN) 200 MG tablet Take 400 mg by mouth every 6 (six) hours as needed for moderate pain.   No current facility-administered medications on file prior to visit.    Review of Systems Per HPI unless specifically indicated above  Objective:    BP 138/80 mmHg  Pulse 84  Temp(Src) 98.2 F (36.8 C) (Oral)  Wt 146 lb (66.225 kg)  SpO2 99%  Wt Readings from Last 3 Encounters:  11/29/14 146 lb (66.225 kg)  10/07/14 147 lb (66.679 kg)  10/06/14 148 lb 4 oz (67.246 kg)    Physical Exam  Constitutional: She appears well-developed and well-nourished. No distress.  Musculoskeletal: She exhibits edema.  1+ DP bilaterally No palpable cords of calf Right medial mid leg with mildly erythematous tender nodule at site of healing crush injury. Skin  intact R ankle swollen predominantly medially, very tender to palpation throughout, stiff with limited ROM 2/2 pain and swelling, warm and red.  Skin: Skin is warm and dry. No rash noted. There is erythema.  Nursing note and vitals reviewed.  Results for orders placed or performed in visit on 11/16/14  POCT INR  Result Value Ref Range   INR 3.6    Lab Results  Component Value Date   CREATININE 1.03* 10/09/2014      Assessment & Plan:   Problem List Items Addressed This Visit    Right ankle pain - Primary    Mild discomfort over 2 wks after possible strain but now more acute worsening with swelling, redness, warmth. Check labs stat today (ESR, CBC) and xray to eval fracture. ?cellulitis - start keflex 272m QID course again - pt tolerated well last month. RTC 2-3 d for close outpt f/u, call or return right away if not improving with treatment. Pt agrees with plan.      Relevant Orders   DG Ankle Complete Right   CBC with Differential/Platelet   Basic metabolic panel   Uric acid   Sedimentation rate       Follow up plan: Return in about 3 days (around 12/02/2014), or if symptoms worsen or fail to improve, for follow up visit.

## 2014-11-29 NOTE — Progress Notes (Signed)
Pre visit review using our clinic review tool, if applicable. No additional management support is needed unless otherwise documented below in the visit note. 

## 2014-11-29 NOTE — Assessment & Plan Note (Signed)
Mild discomfort over 2 wks after possible strain but now more acute worsening with swelling, redness, warmth. Check labs stat today (ESR, CBC) and xray to eval fracture. ?cellulitis - start keflex 245m QID course again - pt tolerated well last month. RTC 2-3 d for close outpt f/u, call or return right away if not improving with treatment. Pt agrees with plan.

## 2014-11-30 ENCOUNTER — Ambulatory Visit (INDEPENDENT_AMBULATORY_CARE_PROVIDER_SITE_OTHER): Payer: Medicare Other | Admitting: Pharmacist

## 2014-11-30 DIAGNOSIS — Z5181 Encounter for therapeutic drug level monitoring: Secondary | ICD-10-CM

## 2014-11-30 DIAGNOSIS — I4892 Unspecified atrial flutter: Secondary | ICD-10-CM

## 2014-11-30 LAB — POCT INR: INR: 2.2

## 2014-12-01 ENCOUNTER — Telehealth: Payer: Self-pay | Admitting: Family Medicine

## 2014-12-01 ENCOUNTER — Other Ambulatory Visit: Payer: Self-pay | Admitting: Family Medicine

## 2014-12-01 DIAGNOSIS — E785 Hyperlipidemia, unspecified: Secondary | ICD-10-CM

## 2014-12-01 DIAGNOSIS — R7989 Other specified abnormal findings of blood chemistry: Secondary | ICD-10-CM

## 2014-12-01 NOTE — Telephone Encounter (Signed)
Pt returned your call.  

## 2014-12-02 ENCOUNTER — Other Ambulatory Visit (INDEPENDENT_AMBULATORY_CARE_PROVIDER_SITE_OTHER): Payer: Medicare Other

## 2014-12-02 ENCOUNTER — Ambulatory Visit (INDEPENDENT_AMBULATORY_CARE_PROVIDER_SITE_OTHER): Payer: Medicare Other | Admitting: Family Medicine

## 2014-12-02 ENCOUNTER — Encounter: Payer: Self-pay | Admitting: Family Medicine

## 2014-12-02 VITALS — BP 110/60 | HR 98 | Temp 98.0°F | Wt 142.0 lb

## 2014-12-02 DIAGNOSIS — R7989 Other specified abnormal findings of blood chemistry: Secondary | ICD-10-CM

## 2014-12-02 DIAGNOSIS — E785 Hyperlipidemia, unspecified: Secondary | ICD-10-CM | POA: Diagnosis not present

## 2014-12-02 DIAGNOSIS — L03115 Cellulitis of right lower limb: Secondary | ICD-10-CM

## 2014-12-02 LAB — LIPID PANEL
Cholesterol: 160 mg/dL (ref 0–200)
HDL: 57 mg/dL (ref >39.00)
LDL Cholesterol: 75 mg/dL (ref 0–99)
NONHDL: 103.09
Total CHOL/HDL Ratio: 3
Triglycerides: 142 mg/dL (ref 0.0–149.0)
VLDL: 28.4 mg/dL (ref 0.0–40.0)

## 2014-12-02 LAB — TSH: TSH: 1.26 u[IU]/mL (ref 0.35–4.50)

## 2014-12-02 NOTE — Assessment & Plan Note (Signed)
Anticipate cellulitis - finish keflex course, slowly improving.

## 2014-12-02 NOTE — Progress Notes (Signed)
Pre visit review using our clinic review tool, if applicable. No additional management support is needed unless otherwise documented below in the visit note. 

## 2014-12-02 NOTE — Progress Notes (Signed)
BP 110/60 mmHg  Pulse 98  Temp(Src) 98 F (36.7 C) (Tympanic)  Wt 142 lb (64.411 kg)  SpO2 98%   CC: recheck R ankle  Subjective:    Patient ID: [name], female    DOB: 09/27/1929, 81 y.o.   MRN: [id number]  HPI: [name] is a 81 y.o. female presenting on 12/02/2014 for follow-up on foot and leg pain   See prior note for details. Last visit concern for cellulitis of R ankle. Had reassuring labwork (WBC 4.9, urate 3.9, ESR 13). Started on keflex 242m QID. Tolerating well.   Relevant past medical, surgical, family and social history reviewed and updated as indicated. Interim medical history since our last visit reviewed. Allergies and medications reviewed and updated. Current Outpatient Prescriptions on File Prior to Visit  Medication Sig  . acetaminophen (TYLENOL) 500 MG tablet Take 500 mg by mouth every 6 (six) hours as needed for moderate pain.   .Marland Kitchenacyclovir (ZOVIRAX) 400 MG tablet Take 400 mg by mouth daily.    .Marland Kitchenatorvastatin (LIPITOR) 20 MG tablet TAKE 1 TABLET EVERY DAY  . calcium-vitamin D (OSCAL WITH D) 500-200 MG-UNIT per tablet Take 1 tablet by mouth daily.    . cephALEXin (KEFLEX) 250 MG capsule Take 1 capsule (250 mg total) by mouth 4 (four) times daily.  .Marland Kitchendiltiazem (CARDIZEM CD) 120 MG 24 hr capsule Take 120 mg by mouth every evening.   . ferrous sulfate 325 (65 FE) MG tablet Take 1 tablet (325 mg total) by mouth daily with breakfast.  . hydrochlorothiazide (MICROZIDE) 12.5 MG capsule TAKE 1 CAPSULE BY MOUTH ONCE DAILY  . metoprolol tartrate (LOPRESSOR) 25 MG tablet Take 25 mg by mouth 2 (two) times daily.  . pantoprazole (PROTONIX) 40 MG tablet Take 40 mg by mouth daily.  . prednisoLONE acetate (PRED FORTE) 1 % ophthalmic suspension Place 1 drop into the left eye daily.   .Marland KitchenrOPINIRole (REQUIP) 1 MG tablet Take 2 mg by mouth at bedtime.  . vitamin B-12 (CYANOCOBALAMIN) 1000 MCG tablet Take 1,000 mcg by mouth daily.  .Marland Kitchenwarfarin (COUMADIN) 5 MG tablet  Take 2.5-5 mg by mouth daily. Pt takes one tablet on Sunday, Monday, Wednesday, and Friday.   Pt takes one-half tablet on Tuesday, Thursday, and Saturday.  . warfarin (COUMADIN) 5 MG tablet TAKE ONE TABLET EVERY DAY   No current facility-administered medications on file prior to visit.    Review of Systems Per HPI unless specifically indicated above     Objective:    BP 110/60 mmHg  Pulse 98  Temp(Src) 98 F (36.7 C) (Tympanic)  Wt 142 lb (64.411 kg)  SpO2 98%  Wt Readings from Last 3 Encounters:  12/02/14 142 lb (64.411 kg)  11/29/14 146 lb (66.225 kg)  10/07/14 147 lb (66.679 kg)    Physical Exam  Constitutional: She appears well-developed and well-nourished. No distress.  Musculoskeletal: She exhibits edema.  Resolving hematoma R medial leg R ankle less erythematous, no longer warm, decreased swelling, still tender with movement at ankle.  Skin: Skin is warm and dry.  Nursing note and vitals reviewed.  Results for orders placed or performed in visit on 11/30/14  POCT INR  Result Value Ref Range   INR 2.2       Assessment & Plan:  Keep f/u appt at CPE next week. Problem List Items Addressed This Visit    Cellulitis of right ankle - Primary    Anticipate cellulitis - finish keflex course,  slowly improving.          Follow up plan: No Follow-up on file.

## 2014-12-08 ENCOUNTER — Other Ambulatory Visit: Payer: Self-pay | Admitting: Family Medicine

## 2014-12-08 ENCOUNTER — Encounter: Payer: Self-pay | Admitting: Family Medicine

## 2014-12-08 ENCOUNTER — Ambulatory Visit (INDEPENDENT_AMBULATORY_CARE_PROVIDER_SITE_OTHER): Payer: Medicare Other | Admitting: Family Medicine

## 2014-12-08 VITALS — BP 136/78 | HR 84 | Temp 98.0°F | Ht 64.0 in | Wt 144.0 lb

## 2014-12-08 DIAGNOSIS — Z0000 Encounter for general adult medical examination without abnormal findings: Secondary | ICD-10-CM | POA: Diagnosis not present

## 2014-12-08 DIAGNOSIS — G2581 Restless legs syndrome: Secondary | ICD-10-CM

## 2014-12-08 DIAGNOSIS — Z7189 Other specified counseling: Secondary | ICD-10-CM

## 2014-12-08 DIAGNOSIS — L03115 Cellulitis of right lower limb: Secondary | ICD-10-CM

## 2014-12-08 DIAGNOSIS — Z23 Encounter for immunization: Secondary | ICD-10-CM

## 2014-12-08 DIAGNOSIS — R7989 Other specified abnormal findings of blood chemistry: Secondary | ICD-10-CM

## 2014-12-08 DIAGNOSIS — E785 Hyperlipidemia, unspecified: Secondary | ICD-10-CM

## 2014-12-08 DIAGNOSIS — I4892 Unspecified atrial flutter: Secondary | ICD-10-CM

## 2014-12-08 DIAGNOSIS — M810 Age-related osteoporosis without current pathological fracture: Secondary | ICD-10-CM

## 2014-12-08 DIAGNOSIS — Z1231 Encounter for screening mammogram for malignant neoplasm of breast: Secondary | ICD-10-CM

## 2014-12-08 DIAGNOSIS — I10 Essential (primary) hypertension: Secondary | ICD-10-CM

## 2014-12-08 NOTE — Assessment & Plan Note (Signed)
Followed by cardiology coumadin clinic.  

## 2014-12-08 NOTE — Assessment & Plan Note (Deleted)
Continue requip

## 2014-12-08 NOTE — Progress Notes (Signed)
Pre visit review using our clinic review tool, if applicable. No additional management support is needed unless otherwise documented below in the visit note. 

## 2014-12-08 NOTE — Assessment & Plan Note (Signed)
Persistent pain at ankle despite tylenol. ?developing RSD type picture. rec finish abx and continue to monitor. Pt agrees.

## 2014-12-08 NOTE — Assessment & Plan Note (Signed)
Chronic, stable. Continue lipitor.  

## 2014-12-08 NOTE — Assessment & Plan Note (Signed)
I have personally reviewed the Medicare Annual Wellness questionnaire and have noted 1. The patient's medical and social history 2. Their use of alcohol, tobacco or illicit drugs 3. Their current medications and supplements 4. The patient's functional ability including ADL's, fall risks, home safety risks and hearing or visual impairment. Cognitive function has been assessed and addressed as indicated.  5. Diet and physical activity 6. Evidence for depression or mood disorders The patients weight, height, BMI have been recorded in the chart. I have made referrals, counseling and provided education to the patient based on review of the above and I have provided the pt with a written personalized care plan for preventive services. [name] list updated.. See scanned questionairre as needed for further documentation. Reviewed preventative protocols and updated unless pt declined.  

## 2014-12-08 NOTE — Assessment & Plan Note (Signed)
Chronic, stable. Continue current regimen. 

## 2014-12-08 NOTE — Assessment & Plan Note (Signed)
Continue cal/vit D. Update DEXA to see if further treatment needed.

## 2014-12-08 NOTE — Patient Instructions (Addendum)
Call to schedule mammogram at breast center in [place]. We will try and set up bone density on same day.  Flu shot today Return after 1 month for pneumonia shot (prevnar) Return in 6 months for follow up visit.  Health Maintenance Adopting a healthy lifestyle and getting preventive care can go a long way to promote health and wellness. Talk with your health care provider about what schedule of regular examinations is right for you. This is a good chance for you to check in with your provider about disease prevention and staying healthy. In between checkups, there are plenty of things you can do on your own. Experts have done a lot of research about which lifestyle changes and preventive measures are most likely to keep you healthy. Ask your health care provider for more information. WEIGHT AND DIET  Eat a healthy diet  Be sure to include plenty of vegetables, fruits, low-fat dairy products, and lean protein.  Do not eat a lot of foods high in solid fats, added sugars, or salt.  Get regular exercise. This is one of the most important things you can do for your health.  Most adults should exercise for at least 150 minutes each week. The exercise should increase your heart rate and make you sweat (moderate-intensity exercise).  Most adults should also do strengthening exercises at least twice a week. This is in addition to the moderate-intensity exercise.  Maintain a healthy weight  Body mass index (BMI) is a measurement that can be used to identify possible weight problems. It estimates body fat based on height and weight. Your health care provider can help determine your BMI and help you achieve or maintain a healthy weight.  For females 72 years of age and older:   A BMI below 18.5 is considered underweight.  A BMI of 18.5 to 24.9 is normal.  A BMI of 25 to 29.9 is considered overweight.  A BMI of 30 and above is considered obese.  Watch levels of cholesterol and blood  lipids  You should start having your blood tested for lipids and cholesterol at 79 years of age, then have this test every 5 years.  You may need to have your cholesterol levels checked more often if:  Your lipid or cholesterol levels are high.  You are older than 79 years of age.  You are at high risk for heart disease.  CANCER SCREENING   Lung Cancer  Lung cancer screening is recommended for adults 35-55 years old who are at high risk for lung cancer because of a history of smoking.  A yearly low-dose CT scan of the lungs is recommended for people who:  Currently smoke.  Have quit within the past 15 years.  Have at least a 30-pack-year history of smoking. A pack year is smoking an average of one pack of cigarettes a day for 1 year.  Yearly screening should continue until it has been 15 years since you quit.  Yearly screening should stop if you develop a health problem that would prevent you from having lung cancer treatment.  Breast Cancer  Practice breast self-awareness. This means understanding how your breasts normally appear and feel.  It also means doing regular breast self-exams. Let your health care provider know about any changes, no matter how small.  If you are in your 20s or 30s, you should have a clinical breast exam (CBE) by a health care provider every 1-3 years as part of a regular health exam.  If you  are 48 or older, have a CBE every year. Also consider having a breast X-ray (mammogram) every year.  If you have a family history of breast cancer, talk to your health care provider about genetic screening.  If you are at high risk for breast cancer, talk to your health care provider about having an MRI and a mammogram every year.  Breast cancer gene (BRCA) assessment is recommended for women who have family members with BRCA-related cancers. BRCA-related cancers include:  Breast.  Ovarian.  Tubal.  Peritoneal cancers.  Results of the assessment  will determine the need for genetic counseling and BRCA1 and BRCA2 testing. Cervical Cancer Routine pelvic examinations to screen for cervical cancer are no longer recommended for nonpregnant women who are considered low risk for cancer of the pelvic organs (ovaries, uterus, and vagina) and who do not have symptoms. A pelvic examination may be necessary if you have symptoms including those associated with pelvic infections. Ask your health care provider if a screening pelvic exam is right for you.   The Pap test is the screening test for cervical cancer for women who are considered at risk.  If you had a hysterectomy for a problem that was not cancer or a condition that could lead to cancer, then you no longer need Pap tests.  If you are older than 65 years, and you have had normal Pap tests for the past 10 years, you no longer need to have Pap tests.  If you have had past treatment for cervical cancer or a condition that could lead to cancer, you need Pap tests and screening for cancer for at least 20 years after your treatment.  If you no longer get a Pap test, assess your risk factors if they change (such as having a new sexual partner). This can affect whether you should start being screened again.  Some women have medical problems that increase their chance of getting cervical cancer. If this is the case for you, your health care provider may recommend more frequent screening and Pap tests.  The human papillomavirus (HPV) test is another test that may be used for cervical cancer screening. The HPV test looks for the virus that can cause cell changes in the cervix. The cells collected during the Pap test can be tested for HPV.  The HPV test can be used to screen women 40 years of age and older. Getting tested for HPV can extend the interval between normal Pap tests from three to five years.  An HPV test also should be used to screen women of any age who have unclear Pap test results.  After  79 years of age, women should have HPV testing as often as Pap tests.  Colorectal Cancer  This type of cancer can be detected and often prevented.  Routine colorectal cancer screening usually begins at 79 years of age and continues through 79 years of age.  Your health care provider may recommend screening at an earlier age if you have risk factors for colon cancer.  Your health care provider may also recommend using home test kits to check for hidden blood in the stool.  A small camera at the end of a tube can be used to examine your colon directly (sigmoidoscopy or colonoscopy). This is done to check for the earliest forms of colorectal cancer.  Routine screening usually begins at age 17.  Direct examination of the colon should be repeated every 5-10 years through 79 years of age. However, you may  need to be screened more often if early forms of precancerous polyps or small growths are found. Skin Cancer  Check your skin from head to toe regularly.  Tell your health care provider about any new moles or changes in moles, especially if there is a change in a mole's shape or color.  Also tell your health care provider if you have a mole that is larger than the size of a pencil eraser.  Always use sunscreen. Apply sunscreen liberally and repeatedly throughout the day.  Protect yourself by wearing long sleeves, pants, a wide-brimmed hat, and sunglasses whenever you are outside. HEART DISEASE, DIABETES, AND HIGH BLOOD PRESSURE   Have your blood pressure checked at least every 1-2 years. High blood pressure causes heart disease and increases the risk of stroke.  If you are between 84 years and 35 years old, ask your health care provider if you should take aspirin to prevent strokes.  Have regular diabetes screenings. This involves taking a blood sample to check your fasting blood sugar level.  If you are at a normal weight and have a low risk for diabetes, have this test once every  three years after 79 years of age.  If you are overweight and have a high risk for diabetes, consider being tested at a younger age or more often. PREVENTING INFECTION  Hepatitis B  If you have a higher risk for hepatitis B, you should be screened for this virus. You are considered at high risk for hepatitis B if:  You were born in a country where hepatitis B is common. Ask your health care provider which countries are considered high risk.  Your parents were born in a high-risk country, and you have not been immunized against hepatitis B (hepatitis B vaccine).  You have HIV or AIDS.  You use needles to inject street drugs.  You live with someone who has hepatitis B.  You have had sex with someone who has hepatitis B.  You get hemodialysis treatment.  You take certain medicines for conditions, including cancer, organ transplantation, and autoimmune conditions. Hepatitis C  Blood testing is recommended for:  Everyone born from [birth year] through 1963.  Anyone with known risk factors for hepatitis C. Sexually transmitted infections (STIs)  You should be screened for sexually transmitted infections (STIs) including gonorrhea and chlamydia if:  You are sexually active and are younger than 79 years of age.  You are older than 79 years of age and your health care provider tells you that you are at risk for this type of infection.  Your sexual activity has changed since you were last screened and you are at an increased risk for chlamydia or gonorrhea. Ask your health care provider if you are at risk.  If you do not have HIV, but are at risk, it may be recommended that you take a prescription medicine daily to prevent HIV infection. This is called pre-exposure prophylaxis (PrEP). You are considered at risk if:  You are sexually active and do not regularly use condoms or know the HIV status of your partner(s).  You take drugs by injection.  You are sexually active with a partner who  has HIV. Talk with your health care provider about whether you are at high risk of being infected with HIV. If you choose to begin PrEP, you should first be tested for HIV. You should then be tested every 3 months for as long as you are taking PrEP.  PREGNANCY   If you are  premenopausal and you may become pregnant, ask your health care provider about preconception counseling.  If you may become pregnant, take 400 to 800 micrograms (mcg) of folic acid every day.  If you want to prevent pregnancy, talk to your health care provider about birth control (contraception). OSTEOPOROSIS AND MENOPAUSE   Osteoporosis is a disease in which the bones lose minerals and strength with aging. This can result in serious bone fractures. Your risk for osteoporosis can be identified using a bone density scan.  If you are 27 years of age or older, or if you are at risk for osteoporosis and fractures, ask your health care provider if you should be screened.  Ask your health care provider whether you should take a calcium or vitamin D supplement to lower your risk for osteoporosis.  Menopause may have certain physical symptoms and risks.  Hormone replacement therapy may reduce some of these symptoms and risks. Talk to your health care provider about whether hormone replacement therapy is right for you.  HOME CARE INSTRUCTIONS   Schedule regular health, dental, and eye exams.  Stay current with your immunizations.   Do not use any tobacco products including cigarettes, chewing tobacco, or electronic cigarettes.  If you are pregnant, do not drink alcohol.  If you are breastfeeding, limit how much and how often you drink alcohol.  Limit alcohol intake to no more than 1 drink per day for nonpregnant women. One drink equals 12 ounces of beer, 5 ounces of wine, or 1 ounces of hard liquor.  Do not use street drugs.  Do not share needles.  Ask your health care provider for help if you need support or  information about quitting drugs.  Tell your health care provider if you often feel depressed.  Tell your health care provider if you have ever been abused or do not feel safe at home. Document Released: 10/08/2010 Document Revised: 08/09/2013 Document Reviewed: 02/24/2013 [facility] Patient Information 2015 [place], [place]. This information is not intended to replace advice given to you by your health care provider. Make sure you discuss any questions you have with your health care provider.

## 2014-12-08 NOTE — Assessment & Plan Note (Addendum)
Advanced planning - Has living will at home and asked to bring me copy - son Gary is HCPOA. Does not want extended life support.  

## 2014-12-08 NOTE — Assessment & Plan Note (Signed)
Preventative protocols reviewed and updated unless pt declined. Discussed healthy diet and lifestyle.  

## 2014-12-08 NOTE — Assessment & Plan Note (Signed)
Staying normal. Will remove from problem lest.
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
## 2014-12-08 NOTE — Assessment & Plan Note (Signed)
Continue requip 2mg nightly 

## 2014-12-21 ENCOUNTER — Ambulatory Visit (INDEPENDENT_AMBULATORY_CARE_PROVIDER_SITE_OTHER): Payer: Medicare Other

## 2014-12-21 DIAGNOSIS — Z5181 Encounter for therapeutic drug level monitoring: Secondary | ICD-10-CM | POA: Diagnosis not present

## 2014-12-21 DIAGNOSIS — I4892 Unspecified atrial flutter: Secondary | ICD-10-CM | POA: Diagnosis not present

## 2014-12-21 LAB — POCT INR: INR: 3.3

## 2014-12-26 ENCOUNTER — Other Ambulatory Visit: Payer: Self-pay | Admitting: Family Medicine

## 2014-12-27 NOTE — Telephone Encounter (Signed)
Message left for patient to return my call to advise if she is taking venlafaxine.

## 2015-01-03 ENCOUNTER — Other Ambulatory Visit: Payer: Medicare Other

## 2015-01-03 ENCOUNTER — Ambulatory Visit: Payer: Medicare Other

## 2015-01-05 NOTE — Telephone Encounter (Signed)
Message left for patient to return my call and advise if she is still taking venlafaxine.

## 2015-01-06 ENCOUNTER — Encounter: Payer: Self-pay | Admitting: Family Medicine

## 2015-01-06 ENCOUNTER — Ambulatory Visit (INDEPENDENT_AMBULATORY_CARE_PROVIDER_SITE_OTHER): Payer: Medicare Other | Admitting: Family Medicine

## 2015-01-06 VITALS — BP 124/76 | HR 68 | Temp 97.7°F | Wt 144.0 lb

## 2015-01-06 DIAGNOSIS — I481 Persistent atrial fibrillation: Secondary | ICD-10-CM | POA: Diagnosis not present

## 2015-01-06 DIAGNOSIS — G2581 Restless legs syndrome: Secondary | ICD-10-CM | POA: Diagnosis not present

## 2015-01-06 DIAGNOSIS — M79604 Pain in right leg: Secondary | ICD-10-CM

## 2015-01-06 DIAGNOSIS — F330 Major depressive disorder, recurrent, mild: Secondary | ICD-10-CM

## 2015-01-06 DIAGNOSIS — I4819 Other persistent atrial fibrillation: Secondary | ICD-10-CM

## 2015-01-06 DIAGNOSIS — M79601 Pain in right arm: Secondary | ICD-10-CM

## 2015-01-06 DIAGNOSIS — G8929 Other chronic pain: Secondary | ICD-10-CM

## 2015-01-06 MED ORDER — VENLAFAXINE HCL 37.5 MG PO TABS: 37.5000 mg | ORAL_TABLET | Freq: Two times a day (BID) | ORAL | Status: DC

## 2015-01-06 MED ORDER — DICLOFENAC SODIUM 1 % TD GEL: 1.0000 "application " | Freq: Three times a day (TID) | TRANSDERMAL | Status: DC

## 2015-01-06 MED ORDER — ROPINIROLE HCL 1 MG PO TABS: 2.0000 mg | ORAL_TABLET | Freq: Every day | ORAL | Status: DC

## 2015-01-06 MED ORDER — GABAPENTIN 100 MG PO CAPS: 100.0000 mg | ORAL_CAPSULE | Freq: Two times a day (BID) | ORAL | Status: DC

## 2015-01-06 NOTE — Assessment & Plan Note (Addendum)
Pt considering transition off coumadin to NOAC.

## 2015-01-06 NOTE — Patient Instructions (Addendum)
I'm worried we're developing nerve pain from injury. Treat with voltaren gel twice to three times daily for pain.  Try gabapentin 100mg  daily for 3 days then increase to twice daily (may make you dizzy). Increase effexor to twice daily.  Return to see me in 2 weeks for follow up.
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
## 2015-01-06 NOTE — Assessment & Plan Note (Signed)
Continue requip 2mg  nightly. Pt considering transition off coumadin.
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
## 2015-01-06 NOTE — Telephone Encounter (Signed)
Pt returned your call to go over medications.  Please cal back thanks

## 2015-01-06 NOTE — Telephone Encounter (Signed)
Spoke with patient. Follow up scheduled.

## 2015-01-06 NOTE — Assessment & Plan Note (Signed)
Situational - to persistent RLE pain. She has restarted effexor - will increase to 37.5mg  BID. Pt agrees with plan. Denies SI/HI.

## 2015-01-11 ENCOUNTER — Telehealth: Payer: Self-pay | Admitting: *Deleted

## 2015-01-11 ENCOUNTER — Ambulatory Visit: Payer: Medicare Other

## 2015-01-11 NOTE — Telephone Encounter (Signed)
Filled and in Kim's box. 

## 2015-01-11 NOTE — Telephone Encounter (Signed)
PA required for Voltaren gel. Form in your IN box for completion.

## 2015-01-11 NOTE — Telephone Encounter (Signed)
PA faxed. Will await determination. 

## 2015-01-13 NOTE — Telephone Encounter (Signed)
plz notify pt this was denied by insurance - would have her price out out-of-pocket cost. Alternatively could try different cream but may also not be covered.

## 2015-01-13 NOTE — Telephone Encounter (Signed)
PA denied. In your IN box for review.

## 2015-01-16 ENCOUNTER — Telehealth: Payer: Self-pay | Admitting: Family Medicine

## 2015-01-16 NOTE — Telephone Encounter (Signed)
Patient returned [name]'s call.  Patient said she received samples of [employer] from her pharmacist. Patient said it has helped. Patient said she hasn't been having as much pain taking the gabapentin. Patient wants to know if she can come in Wednesday or Friday for the pneumonia shot.  Patient is asking for [name] to call her back.

## 2015-01-16 NOTE — Telephone Encounter (Signed)
Message left notifying patient and advising to call if she would like to change meds.

## 2015-01-16 NOTE — Telephone Encounter (Signed)
Noted and nurse visit scheduled. Patient notified.

## 2015-01-18 ENCOUNTER — Ambulatory Visit (INDEPENDENT_AMBULATORY_CARE_PROVIDER_SITE_OTHER): Payer: Medicare Other | Admitting: *Deleted

## 2015-01-18 DIAGNOSIS — Z23 Encounter for immunization: Secondary | ICD-10-CM | POA: Diagnosis not present

## 2015-01-28 ENCOUNTER — Other Ambulatory Visit: Payer: Self-pay | Admitting: Family Medicine

## 2015-01-31 ENCOUNTER — Ambulatory Visit: Payer: Medicare Other | Admitting: Cardiovascular Disease

## 2015-01-31 ENCOUNTER — Ambulatory Visit (INDEPENDENT_AMBULATORY_CARE_PROVIDER_SITE_OTHER): Payer: Medicare Other

## 2015-01-31 DIAGNOSIS — I4892 Unspecified atrial flutter: Secondary | ICD-10-CM

## 2015-01-31 DIAGNOSIS — Z5181 Encounter for therapeutic drug level monitoring: Secondary | ICD-10-CM | POA: Diagnosis not present

## 2015-01-31 LAB — POCT INR: INR: 1.8

## 2015-02-20 ENCOUNTER — Encounter: Payer: Self-pay | Admitting: Internal Medicine

## 2015-02-20 ENCOUNTER — Telehealth: Payer: Self-pay

## 2015-02-20 ENCOUNTER — Ambulatory Visit (INDEPENDENT_AMBULATORY_CARE_PROVIDER_SITE_OTHER): Payer: Medicare Other | Admitting: Internal Medicine

## 2015-02-20 VITALS — BP 150/80 | HR 70 | Temp 97.7°F | Wt 146.0 lb

## 2015-02-20 DIAGNOSIS — M79661 Pain in right lower leg: Secondary | ICD-10-CM

## 2015-02-20 DIAGNOSIS — M792 Neuralgia and neuritis, unspecified: Secondary | ICD-10-CM | POA: Diagnosis not present

## 2015-02-20 MED ORDER — GABAPENTIN 100 MG PO CAPS: 200.0000 mg | ORAL_CAPSULE | Freq: Two times a day (BID) | ORAL | Status: DC

## 2015-02-20 NOTE — Telephone Encounter (Signed)
Pt scheduled appt to see Webb Silversmith NP 02/20/15 at 2:15.

## 2015-02-20 NOTE — Progress Notes (Signed)
Pre visit review using our clinic review tool, if applicable. No additional management support is needed unless otherwise documented below in the visit note. 
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
## 2015-02-20 NOTE — Patient Instructions (Signed)
Radicular Pain °Radicular pain in either the arm or leg is usually from a bulging or herniated disk in the spine. A piece of the herniated disk may press against the nerves as the nerves exit the spine. This causes pain which is felt at the tips of the nerves down the arm or leg. Other causes of radicular pain may include: °· Fractures. °· Heart disease. °· Cancer. °· An abnormal and usually degenerative state of the nervous system or nerves (neuropathy). °Diagnosis may require CT or MRI scanning to determine the primary cause.  °Nerves that start at the neck (nerve roots) may cause radicular pain in the outer shoulder and arm. It can spread down to the thumb and fingers. The symptoms vary depending on which nerve root has been affected. In most cases radicular pain improves with conservative treatment. Neck problems may require physical therapy, a neck collar, or cervical traction. Treatment may take many weeks, and surgery may be considered if the symptoms do not improve.  °Conservative treatment is also recommended for sciatica. Sciatica causes pain to radiate from the lower back or buttock area down the leg into the foot. Often there is a history of back problems. Most patients with sciatica are better after 2 to 4 weeks of rest and other supportive care. Short term bed rest can reduce the disk pressure considerably. Sitting, however, is not a good position since this increases the pressure on the disk. You should avoid bending, lifting, and all other activities which make the problem worse. Traction can be used in severe cases. Surgery is usually reserved for patients who do not improve within the first months of treatment. °Only take over-the-counter or prescription medicines for pain, discomfort, or fever as directed by your caregiver. Narcotics and muscle relaxants may help by relieving more severe pain and spasm and by providing mild sedation. Cold or massage can give significant relief. Spinal manipulation  is not recommended. It can increase the degree of disc protrusion. Epidural steroid injections are often effective treatment for radicular pain. These injections deliver medicine to the spinal nerve in the space between the protective covering of the spinal cord and back bones (vertebrae). Your caregiver can give you more information about steroid injections. These injections are most effective when given within two weeks of the onset of pain.  °You should see your caregiver for follow up care as recommended. A program for neck and back injury rehabilitation with stretching and strengthening exercises is an important part of management.  °SEEK IMMEDIATE MEDICAL CARE IF: °· You develop increased pain, weakness, or numbness in your arm or leg. °· You develop difficulty with bladder or bowel control. °· You develop abdominal pain. °  °This information is not intended to replace advice given to you by your health care provider. Make sure you discuss any questions you have with your health care provider. °  °Document Released: 05/02/2004 Document Revised: 04/15/2014 Document Reviewed: 10/19/2014 °Elsevier Interactive Patient Education ©2016 [employer] ° °
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
## 2015-02-22 ENCOUNTER — Ambulatory Visit (INDEPENDENT_AMBULATORY_CARE_PROVIDER_SITE_OTHER): Payer: Medicare Other

## 2015-02-22 DIAGNOSIS — I4892 Unspecified atrial flutter: Secondary | ICD-10-CM

## 2015-02-22 DIAGNOSIS — Z5181 Encounter for therapeutic drug level monitoring: Secondary | ICD-10-CM | POA: Diagnosis not present

## 2015-02-22 LAB — POCT INR: INR: 1.3

## 2015-03-20 ENCOUNTER — Ambulatory Visit (INDEPENDENT_AMBULATORY_CARE_PROVIDER_SITE_OTHER): Payer: Medicare Other | Admitting: Cardiovascular Disease

## 2015-03-20 ENCOUNTER — Encounter: Payer: Self-pay | Admitting: Cardiovascular Disease

## 2015-03-20 ENCOUNTER — Ambulatory Visit (INDEPENDENT_AMBULATORY_CARE_PROVIDER_SITE_OTHER): Payer: Medicare Other

## 2015-03-20 VITALS — BP 104/82 | HR 84 | Ht 64.0 in | Wt 149.5 lb

## 2015-03-20 DIAGNOSIS — Z5181 Encounter for therapeutic drug level monitoring: Secondary | ICD-10-CM

## 2015-03-20 DIAGNOSIS — E785 Hyperlipidemia, unspecified: Secondary | ICD-10-CM | POA: Diagnosis not present

## 2015-03-20 DIAGNOSIS — I481 Persistent atrial fibrillation: Secondary | ICD-10-CM | POA: Diagnosis not present

## 2015-03-20 DIAGNOSIS — I4819 Other persistent atrial fibrillation: Secondary | ICD-10-CM

## 2015-03-20 DIAGNOSIS — I4892 Unspecified atrial flutter: Secondary | ICD-10-CM | POA: Diagnosis not present

## 2015-03-20 DIAGNOSIS — I10 Essential (primary) hypertension: Secondary | ICD-10-CM

## 2015-03-20 LAB — POCT INR: INR: 1.6

## 2015-03-20 MED ORDER — APIXABAN 5 MG PO TABS: 5.0000 mg | ORAL_TABLET | Freq: Two times a day (BID) | ORAL | Status: DC

## 2015-03-20 NOTE — Assessment & Plan Note (Signed)
Blood pressure is well controlled on current medications. 

## 2015-03-20 NOTE — Assessment & Plan Note (Signed)
She is noted to be in atrial fibrillation today. Ventricular rate is controlled on metoprolol and diltiazem. She wants to switch anticoagulation from warfarin and thus I started Eliquis 5 mg twice daily. If this is not covered by her insurance, she is going to call us back. Recent labs were unremarkable including CBC and basic metabolic profile.

## 2015-03-20 NOTE — Patient Instructions (Signed)
Medication Instructions:  Your physician has recommended you make the following change in your medication:  STOP taking coumadin START taking Eliquis 5mg  twice per day   Labwork: BMET and CBC with in 30 days   Testing/Procedures: none  Follow-Up: Your physician wants you to follow-up in: six months with Dr. Fletcher Anon. You will receive a reminder letter in the mail two months in advance. If you don't receive a letter, please call our office to schedule the follow-up appointment.   Any Other Special Instructions Will Be Listed Below (If Applicable).     If you need a refill on your cardiac medications before your next appointment, please call your pharmacy.
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
## 2015-03-20 NOTE — Assessment & Plan Note (Signed)
Lab Results  Component Value Date   CHOL 160 12/02/2014   HDL 57.00 12/02/2014   LDLCALC 75 12/02/2014        TRIG 142.0 12/02/2014   CHOLHDL 3 12/02/2014   Continue treatment with atorvastatin 20 mg once daily. LDL was below 100.

## 2015-04-24 ENCOUNTER — Other Ambulatory Visit (INDEPENDENT_AMBULATORY_CARE_PROVIDER_SITE_OTHER): Payer: Medicare Other | Admitting: *Deleted

## 2015-04-24 DIAGNOSIS — I4892 Unspecified atrial flutter: Secondary | ICD-10-CM | POA: Diagnosis not present

## 2015-04-25 LAB — BASIC METABOLIC PANEL
BUN/Creatinine Ratio: 14 (ref 11–26)
BUN: 13 mg/dL (ref 8–27)
CALCIUM: 9.2 mg/dL (ref 8.7–10.3)
CO2: 21 mmol/L (ref 18–29)
Chloride: 102 mmol/L (ref 96–106)
Creatinine, Ser: 0.96 mg/dL (ref 0.57–1.00)
GFR calc Af Amer: 63 mL/min/{1.73_m2} (ref >59)
GFR, EST NON AFRICAN AMERICAN: 55 mL/min/{1.73_m2} — AB (ref >59)
Glucose: 116 mg/dL — ABNORMAL HIGH (ref 65–99)
POTASSIUM: 4 mmol/L (ref 3.5–5.2)
Sodium: 144 mmol/L (ref 134–144)

## 2015-04-25 LAB — CBC
Hematocrit: 41.1 % (ref 34.0–46.6)
Hemoglobin: 14.3 g/dL (ref 11.1–15.9)
MCH: 31.1 pg (ref 26.6–33.0)
MCHC: 34.8 g/dL (ref 31.5–35.7)
MCV: 89 fL (ref 79–97)
PLATELETS: 256 10*3/uL (ref 150–379)
RBC: 4.6 x10E6/uL (ref 3.77–5.28)
RDW: 13.5 % (ref 12.3–15.4)
WBC: 6.5 10*3/uL (ref 3.4–10.8)

## 2015-05-03 ENCOUNTER — Ambulatory Visit: Payer: Self-pay | Admitting: Cardiovascular Disease

## 2015-05-03 ENCOUNTER — Telehealth: Payer: Self-pay

## 2015-05-03 DIAGNOSIS — Z5181 Encounter for therapeutic drug level monitoring: Secondary | ICD-10-CM

## 2015-05-03 DIAGNOSIS — I4892 Unspecified atrial flutter: Secondary | ICD-10-CM

## 2015-05-03 NOTE — Telephone Encounter (Signed)
Eliquis is approved through 04/07/2016. LMOM patient notified of the approval.

## 2015-05-03 NOTE — Telephone Encounter (Signed)
Prior authorization sent for approval on Eliquis 5 mg through the covermymeds, awaiting approval.

## 2015-05-22 DIAGNOSIS — B0233 Zoster keratitis: Secondary | ICD-10-CM | POA: Diagnosis not present

## 2015-06-08 ENCOUNTER — Encounter: Payer: Self-pay | Admitting: Family Medicine

## 2015-06-08 ENCOUNTER — Ambulatory Visit (INDEPENDENT_AMBULATORY_CARE_PROVIDER_SITE_OTHER): Payer: Medicare Other | Admitting: Family Medicine

## 2015-06-08 VITALS — BP 130/80 | HR 64 | Temp 97.4°F | Wt 148.5 lb

## 2015-06-08 DIAGNOSIS — M792 Neuralgia and neuritis, unspecified: Secondary | ICD-10-CM | POA: Diagnosis not present

## 2015-06-08 DIAGNOSIS — M79661 Pain in right lower leg: Secondary | ICD-10-CM

## 2015-06-08 DIAGNOSIS — M79601 Pain in right arm: Secondary | ICD-10-CM

## 2015-06-08 DIAGNOSIS — M79604 Pain in right leg: Secondary | ICD-10-CM

## 2015-06-08 DIAGNOSIS — F330 Major depressive disorder, recurrent, mild: Secondary | ICD-10-CM

## 2015-06-08 DIAGNOSIS — G8929 Other chronic pain: Secondary | ICD-10-CM

## 2015-06-08 DIAGNOSIS — I4819 Other persistent atrial fibrillation: Secondary | ICD-10-CM

## 2015-06-08 DIAGNOSIS — I10 Essential (primary) hypertension: Secondary | ICD-10-CM

## 2015-06-08 DIAGNOSIS — I481 Persistent atrial fibrillation: Secondary | ICD-10-CM

## 2015-06-08 DIAGNOSIS — G4700 Insomnia, unspecified: Secondary | ICD-10-CM | POA: Insufficient documentation

## 2015-06-08 MED ORDER — GABAPENTIN 100 MG PO CAPS: 100.0000 mg | ORAL_CAPSULE | Freq: Two times a day (BID) | ORAL | Status: DC

## 2015-06-08 MED ORDER — HYDROCHLOROTHIAZIDE 12.5 MG PO CAPS: 12.5000 mg | ORAL_CAPSULE | Freq: Every day | ORAL | Status: DC

## 2015-06-08 MED ORDER — VENLAFAXINE HCL 37.5 MG PO TABS: 37.5000 mg | ORAL_TABLET | Freq: Every day | ORAL | Status: DC

## 2015-06-08 NOTE — Assessment & Plan Note (Signed)
Marked improvement.  Discussed gabapentin dosing.

## 2015-06-08 NOTE — Assessment & Plan Note (Signed)
Sounds regular today. Discussed dilt.

## 2015-06-08 NOTE — Assessment & Plan Note (Signed)
Discussed chronic insomnia + RLS + PLMD. Discussed requip dosing as well as OTC remedies. Suggested increase gabapentin to 2 tab nightly to see if improvement. Update with effect

## 2015-06-08 NOTE — Assessment & Plan Note (Signed)
Chronic, stable. Continue current regimen. 

## 2015-06-08 NOTE — Assessment & Plan Note (Signed)
Improved with improvement in RLE pain.  She has decreased effexor back to once daily dosing.

## 2015-06-08 NOTE — Patient Instructions (Addendum)
You are doing great today!  Return in 6 months for medicare wellness visit.

## 2015-06-08 NOTE — Progress Notes (Signed)
BP 130/80 mmHg  Pulse 64  Temp(Src) 97.4 F (36.3 C) (Oral)  Wt 148 lb 8 oz (67.359 kg)   CC: 6 mo f/u visit  Subjective:    Patient ID: [name], female    DOB: Apr 30, 1929, 82 y.o.   MRN: [id number]  HPI: KADEJA NEED is a 82 y.o. female presenting on 06/08/2015 for Follow-up   She just celebrated 86th birthday. Planned manicures on Friday with granddaughter. Planning on moving into apartment in [place] ([address]).  Persistent afib - saw Dr Fletcher Anon 03/2015. Started on eliquis in place of coumadin. Tolerating well.   Mild persistent pain at R ankle but so much better! We restarted venlafaxine 37.5mg  and we increased to BID dosing. She hasn't been taking regularly. No SI/HI. We had increased gabapentin to 200mg  BID but pt actually taking 100mg  bid. Off voltaren gel - was too expensive.   Trouble sleeping - has tried benadryl, unisom, zzquil, melatonin. Gabapentin and requip help some but she feels hangover effect next morning.   Relevant past medical, surgical, family and social history reviewed and updated as indicated. Interim medical history since our last visit reviewed. Allergies and medications reviewed and updated. Current Outpatient Prescriptions on File Prior to Visit  Medication Sig  . acetaminophen (TYLENOL) 500 MG tablet Take 500 mg by mouth every 6 (six) hours as needed for moderate pain.   Marland Kitchen acyclovir (ZOVIRAX) 400 MG tablet Take 400 mg by mouth daily.    Marland Kitchen apixaban (ELIQUIS) 5 MG TABS tablet Take 1 tablet (5 mg total) by mouth 2 (two) times daily.  Marland Kitchen atorvastatin (LIPITOR) 20 MG tablet TAKE 1 TABLET EVERY DAY  . calcium-vitamin D (OSCAL WITH D) 500-200 MG-UNIT per tablet Take 1 tablet by mouth daily.    Marland Kitchen diltiazem (CARDIZEM CD) 120 MG 24 hr capsule Take 120 mg by mouth every evening.   . ferrous sulfate 325 (65 FE) MG tablet Take 1 tablet (325 mg total) by mouth daily with breakfast.  . metoprolol tartrate (LOPRESSOR) 25 MG tablet Take 25 mg by mouth 2  (two) times daily.  . pantoprazole (PROTONIX) 40 MG tablet Take 40 mg by mouth daily.  . prednisoLONE acetate (PRED FORTE) 1 % ophthalmic suspension Place 1 drop into the left eye daily.   Marland Kitchen rOPINIRole (REQUIP) 1 MG tablet Take 2 tablets (2 mg total) by mouth at bedtime.  . vitamin B-12 (CYANOCOBALAMIN) 1000 MCG tablet Take 1,000 mcg by mouth daily.   No current facility-administered medications on file prior to visit.    Review of Systems Per HPI unless specifically indicated in ROS section     Objective:    BP 130/80 mmHg  Pulse 64  Temp(Src) 97.4 F (36.3 C) (Oral)  Wt 148 lb 8 oz (67.359 kg)  Wt Readings from Last 3 Encounters:  06/08/15 148 lb 8 oz (67.359 kg)  03/20/15 149 lb 8 oz (67.813 kg)  02/20/15 146 lb (66.225 kg)    Physical Exam  Constitutional: She appears well-developed and well-nourished. No distress.  HENT:  Mouth/Throat: Oropharynx is clear and moist. No oropharyngeal exudate.  Eyes: Conjunctivae and EOM are normal. Pupils are equal, round, and reactive to light.  Cardiovascular: Normal rate, regular rhythm, normal heart sounds and intact distal pulses.   No murmur heard. Sounds regular today  Pulmonary/Chest: Effort normal and breath sounds normal. No respiratory distress. She has no wheezes. She has no rales.  Musculoskeletal: She exhibits no edema.  Marked improvement of swelling RLE  at site of prior injury. Now with only slight induration without erythema, warmth  Nursing note and vitals reviewed.  Results for orders placed or performed in visit on [id number]  Basic metabolic panel  Result Value Ref Range   Glucose 116 (H) 65 - 99 mg/dL   BUN 13 8 - 27 mg/dL   Creatinine, Ser 0.96 0.57 - 1.00 mg/dL   GFR calc non Af Amer 55 (L) >59 mL/min/1.73   GFR calc Af Amer 63 >59 mL/min/1.73   BUN/Creatinine Ratio 14 11 - 26   Sodium 144 134 - 144 mmol/L   Potassium 4.0 3.5 - 5.2 mmol/L   Chloride 102 96 - 106 mmol/L   CO2 21 18 - 29 mmol/L   Calcium 9.2  8.7 - 10.3 mg/dL  CBC  Result Value Ref Range   WBC 6.5 3.4 - 10.8 x10E3/uL   RBC 4.60 3.77 - 5.28 x10E6/uL   Hemoglobin 14.3 11.1 - 15.9 g/dL   Hematocrit 41.1 34.0 - 46.6 %   MCV 89 79 - 97 fL   MCH 31.1 26.6 - 33.0 pg   MCHC 34.8 31.5 - 35.7 g/dL   RDW 13.5 12.3 - 15.4 %   Platelets 256 150 - 379 x10E3/uL      Assessment & Plan:   Problem List Items Addressed This Visit    Persistent atrial fibrillation ([place])    Sounds regular today. Discussed dilt.      Relevant Medications   hydrochlorothiazide (MICROZIDE) 12.5 MG capsule   MDD (major depressive disorder), recurrent episode ([place])    Improved with improvement in RLE pain.  She has decreased effexor back to once daily dosing.       Relevant Medications   venlafaxine (EFFEXOR) 37.5 MG tablet   Insomnia    Discussed chronic insomnia + RLS + PLMD. Discussed requip dosing as well as OTC remedies. Suggested increase gabapentin to 2 tab nightly to see if improvement. Update with effect      Essential hypertension    Chronic, stable. Continue current regimen.      Relevant Medications   hydrochlorothiazide (MICROZIDE) 12.5 MG capsule   Chronic leg pain    Marked improvement.  Discussed gabapentin dosing.       Other Visit Diagnoses    Pain of right lower leg    -  Primary    Relevant Medications    gabapentin (NEURONTIN) 100 MG capsule    Nerve pain        Relevant Medications    venlafaxine (EFFEXOR) 37.5 MG tablet    gabapentin (NEURONTIN) 100 MG capsule        Follow up plan: Return in about 6 months (around 12/09/2015), or as needed, for medicare wellness visit.

## 2015-06-08 NOTE — Progress Notes (Signed)
Pre visit review using our clinic review tool, if applicable. No additional management support is needed unless otherwise documented below in the visit note. 

## 2015-06-19 ENCOUNTER — Other Ambulatory Visit: Payer: Self-pay | Admitting: Cardiovascular Disease

## 2015-08-07 DIAGNOSIS — L570 Actinic keratosis: Secondary | ICD-10-CM | POA: Diagnosis not present

## 2015-08-07 DIAGNOSIS — Z85828 Personal history of other malignant neoplasm of skin: Secondary | ICD-10-CM | POA: Diagnosis not present

## 2015-08-29 ENCOUNTER — Encounter: Payer: Self-pay | Admitting: Family Medicine

## 2015-08-29 ENCOUNTER — Ambulatory Visit (INDEPENDENT_AMBULATORY_CARE_PROVIDER_SITE_OTHER): Payer: Medicare Other | Admitting: Family Medicine

## 2015-08-29 VITALS — BP 132/80 | HR 80 | Temp 97.7°F | Wt 148.5 lb

## 2015-08-29 DIAGNOSIS — S39011A Strain of muscle, fascia and tendon of abdomen, initial encounter: Secondary | ICD-10-CM | POA: Diagnosis not present

## 2015-08-29 DIAGNOSIS — M810 Age-related osteoporosis without current pathological fracture: Secondary | ICD-10-CM

## 2015-08-29 DIAGNOSIS — S76219A Strain of adductor muscle, fascia and tendon of unspecified thigh, initial encounter: Secondary | ICD-10-CM

## 2015-08-29 NOTE — Progress Notes (Signed)
Pre visit review using our clinic review tool, if applicable. No additional management support is needed unless otherwise documented below in the visit note. 

## 2015-08-29 NOTE — Assessment & Plan Note (Signed)
Anticipate msk groin and lumbar strain. Reassuring exam today, reassuring it is improving daily.  Treat with exercises provided today, tylenol, heating pad. Discussed indications for imaging - ongoing or worsening pain or loss of function. Pt agrees with plan.

## 2015-08-29 NOTE — Patient Instructions (Addendum)
I prefer tylenol to aleve.  We will get handicap placard ready for you (indication trouble walking more than 200 yards).  Exam is looking ok today. I think this is likely lumbar and groin strains - from overdoing it the last few weeks. Get some rest, continue tylenol. No heavy lifting for 2 weeks.  If not improving, given location of hip pain, let me know for right hip xray.

## 2015-08-29 NOTE — Assessment & Plan Note (Signed)
She did not complete DEXA.

## 2015-08-29 NOTE — Progress Notes (Signed)
BP 132/80 mmHg  Pulse 80  Temp(Src) 97.7 F (36.5 C) (Oral)  Wt 148 lb 8 oz (67.359 kg)   CC: LBP  Subjective:    Patient ID: [name], female    DOB: 04-29-1929, 82 y.o.   MRN: [id number]  HPI: HARVEEN Payne is a 82 y.o. female presenting on 08/29/2015 for Back Pain   3 wk h/o R groin pain and lower back pain at small of back along with bilateral buttock pain. Legs feel crampy.   Denies inciting trauma/falls. No fevers/chills, numbness or weakness of legs. No new incontinence of bowel or bladder.  So far has tried [employer]. Also on eliquis  Noticing increasing fatigue as well - tires out after walking dog 1 block .  Requests handicap parking card.   Relevant past medical, surgical, family and social history reviewed and updated as indicated. Interim medical history since our last visit reviewed. Allergies and medications reviewed and updated. Current Outpatient Prescriptions on File Prior to Visit  Medication Sig  . acetaminophen (TYLENOL) 500 MG tablet Take 500 mg by mouth every 6 (six) hours as needed for moderate pain.   Marland Kitchen acyclovir (ZOVIRAX) 400 MG tablet Take 400 mg by mouth daily.    Marland Kitchen apixaban (ELIQUIS) 5 MG TABS tablet Take 1 tablet (5 mg total) by mouth 2 (two) times daily.  Marland Kitchen atorvastatin (LIPITOR) 20 MG tablet TAKE ONE TABLET EVERY DAY  . calcium-vitamin D (OSCAL WITH D) 500-200 MG-UNIT per tablet Take 1 tablet by mouth daily.    Marland Kitchen diltiazem (CARDIZEM CD) 120 MG 24 hr capsule Take 120 mg by mouth every evening.   . ferrous sulfate 325 (65 FE) MG tablet Take 1 tablet (325 mg total) by mouth daily with breakfast.  . gabapentin (NEURONTIN) 100 MG capsule Take 1 capsule (100 mg total) by mouth 2 (two) times daily.  . hydrochlorothiazide (MICROZIDE) 12.5 MG capsule Take 1 capsule (12.5 mg total) by mouth daily.  . metoprolol tartrate (LOPRESSOR) 25 MG tablet Take 25 mg by mouth 2 (two) times daily.  . pantoprazole (PROTONIX) 40 MG tablet Take 40 mg by mouth daily.    . prednisoLONE acetate (PRED FORTE) 1 % ophthalmic suspension Place 1 drop into the left eye daily.   Marland Kitchen rOPINIRole (REQUIP) 1 MG tablet Take 2 tablets (2 mg total) by mouth at bedtime.  Marland Kitchen venlafaxine (EFFEXOR) 37.5 MG tablet Take 1 tablet (37.5 mg total) by mouth daily.  . vitamin B-12 (CYANOCOBALAMIN) 1000 MCG tablet Take 1,000 mcg by mouth daily.   No current facility-administered medications on file prior to visit.    Review of Systems Per HPI unless specifically indicated in ROS section     Objective:    BP 132/80 mmHg  Pulse 80  Temp(Src) 97.7 F (36.5 C) (Oral)  Wt 148 lb 8 oz (67.359 kg)  Wt Readings from Last 3 Encounters:  08/29/15 148 lb 8 oz (67.359 kg)  06/08/15 148 lb 8 oz (67.359 kg)  03/20/15 149 lb 8 oz (67.813 kg)    Physical Exam  Constitutional: She appears well-developed and well-nourished. No distress.  Musculoskeletal: Normal range of motion. She exhibits no edema.  No pain midline spine No paraspinous mm tenderness Neg SLR bilaterally. No significant pain with int/ext rotation at hip. Neg FABER. No pain at SIJ, GTB or sciatic notch bilaterally.   Neurological:  5/5 strength BLE  Skin: Skin is warm and dry. No rash noted.  Psychiatric: She has a normal mood and  affect.  Nursing note and vitals reviewed.      Assessment & Plan:   Problem List Items Addressed This Visit    Osteoporosis    She did not complete DEXA.       Groin strain - Primary    Anticipate msk groin and lumbar strain. Reassuring exam today, reassuring it is improving daily.  Treat with exercises provided today, tylenol, heating pad. Discussed indications for imaging - ongoing or worsening pain or loss of function. Pt agrees with plan.          Follow up plan: No Follow-up on file.  Ria Bush, MD

## 2015-09-07 DIAGNOSIS — K8020 Calculus of gallbladder without cholecystitis without obstruction: Secondary | ICD-10-CM

## 2015-09-07 DIAGNOSIS — I700 Atherosclerosis of aorta: Secondary | ICD-10-CM

## 2015-09-07 HISTORY — DX: Calculus of gallbladder without cholecystitis without obstruction: K80.20

## 2015-09-07 HISTORY — DX: Atherosclerosis of aorta: I70.0

## 2015-09-11 ENCOUNTER — Encounter: Payer: Self-pay | Admitting: Family Medicine

## 2015-09-11 ENCOUNTER — Ambulatory Visit (INDEPENDENT_AMBULATORY_CARE_PROVIDER_SITE_OTHER): Payer: Medicare Other | Admitting: Family Medicine

## 2015-09-11 ENCOUNTER — Ambulatory Visit (INDEPENDENT_AMBULATORY_CARE_PROVIDER_SITE_OTHER)
Admission: RE | Admit: 2015-09-11 | Discharge: 2015-09-11 | Disposition: A | Discharge status: Home / Self Care | Payer: Medicare Other | Source: Ambulatory Visit | Attending: Family Medicine | Admitting: Family Medicine

## 2015-09-11 VITALS — BP 130/90 | HR 75 | Temp 97.7°F | Ht 64.0 in | Wt 150.8 lb

## 2015-09-11 DIAGNOSIS — M545 Low back pain, unspecified: Secondary | ICD-10-CM

## 2015-09-11 DIAGNOSIS — M5136 Other intervertebral disc degeneration, lumbar region: Secondary | ICD-10-CM | POA: Diagnosis not present

## 2015-09-11 NOTE — Progress Notes (Signed)
BP 130/90 mmHg  Pulse 75  Temp(Src) 97.7 F (36.5 C)  Ht 5\' 4"  (1.626 m)  Wt 150 lb 12.8 oz (68.402 kg)  BMI 25.87 kg/m2  SpO2 96%   CC: back pain  Subjective:    Patient ID: [name], female    DOB: Apr 06, 1930, 82 y.o.   MRN: [id number]  HPI: [name] is a 82 y.o. female presenting on 09/11/2015 for Back Pain   Ongoing back pain. Seen here 5/23 for R groin pain and lower back pain/buttock pain. At last eval, thought groin and lumbar strain, treated with stretching exercises, tylenol, heating pad. Groin pain has improved, back pain persists. Ongoing calf aches despite nightly requip and gabapentin - attributes to ongoing restless leg syndrome. Also using biofreeze. Ongoing trouble sleeping.   Denies numbness/weakness of legs or bowel/bladder incontinence, fevers/chills.   Worse lower back pain weekend. Points to bilateral lower back.  Has been working out in yard.  Ongoing lower back pain since early 08/2015.   Recurrent R subconjunctival hemorrhage - has seen Dr Wallace Going for this.   Relevant past medical, surgical, family and social history reviewed and updated as indicated. Interim medical history since our last visit reviewed. Allergies and medications reviewed and updated. Current Outpatient Prescriptions on File Prior to Visit  Medication Sig  . acetaminophen (TYLENOL) 500 MG tablet Take 500 mg by mouth every 6 (six) hours as needed for moderate pain.   Marland Kitchen acyclovir (ZOVIRAX) 400 MG tablet Take 400 mg by mouth daily.    Marland Kitchen apixaban (ELIQUIS) 5 MG TABS tablet Take 1 tablet (5 mg total) by mouth 2 (two) times daily.  Marland Kitchen atorvastatin (LIPITOR) 20 MG tablet TAKE ONE TABLET EVERY DAY  . calcium-vitamin D (OSCAL WITH D) 500-200 MG-UNIT per tablet Take 1 tablet by mouth daily.    Marland Kitchen diltiazem (CARDIZEM CD) 120 MG 24 hr capsule Take 120 mg by mouth every evening.   . ferrous sulfate 325 (65 FE) MG tablet Take 1 tablet (325 mg total) by mouth daily with breakfast.  .  gabapentin (NEURONTIN) 100 MG capsule Take 1 capsule (100 mg total) by mouth 2 (two) times daily.  . hydrochlorothiazide (MICROZIDE) 12.5 MG capsule Take 1 capsule (12.5 mg total) by mouth daily.  . metoprolol tartrate (LOPRESSOR) 25 MG tablet Take 25 mg by mouth 2 (two) times daily.  . pantoprazole (PROTONIX) 40 MG tablet Take 40 mg by mouth daily.  . prednisoLONE acetate (PRED FORTE) 1 % ophthalmic suspension Place 1 drop into the left eye daily.   Marland Kitchen rOPINIRole (REQUIP) 1 MG tablet Take 2 tablets (2 mg total) by mouth at bedtime.  Marland Kitchen venlafaxine (EFFEXOR) 37.5 MG tablet Take 1 tablet (37.5 mg total) by mouth daily.  . vitamin B-12 (CYANOCOBALAMIN) 1000 MCG tablet Take 1,000 mcg by mouth daily.   No current facility-administered medications on file prior to visit.    Review of Systems Per HPI unless specifically indicated in ROS section     Objective:    BP 130/90 mmHg  Pulse 75  Temp(Src) 97.7 F (36.5 C)  Ht 5\' 4"  (1.626 m)  Wt 150 lb 12.8 oz (68.402 kg)  BMI 25.87 kg/m2  SpO2 96%  Wt Readings from Last 3 Encounters:  09/11/15 150 lb 12.8 oz (68.402 kg)  08/29/15 148 lb 8 oz (67.359 kg)  06/08/15 148 lb 8 oz (67.359 kg)    Physical Exam  Constitutional: She is oriented to person, place, and time. She appears well-developed  and well-nourished. No distress.  Musculoskeletal: She exhibits no edema.  Mild discomfort to palpation midline upper lumbar spine No paraspinous mm tenderness Neg SLR bilaterally. No pain with int/ext rotation at hip. + FABER R>L. Mild discomfort to palpation at R>L SIJ, no pain at GTB or sciatic notch bilaterally.   Neurological: She is alert and oriented to person, place, and time.  Skin: Skin is warm and dry. No rash noted.  Psychiatric: She has a normal mood and affect.  Nursing note and vitals reviewed.      Assessment & Plan:   Problem List Items Addressed This Visit    Lower back pain - Primary    Anticipate ongoing lumbar strain given  still relatively short duration. Anticipate continued improvement over next 1-2 wks.  Suggested scheduled tylenol 1000mg  bid x 1 wk. xrays lumbar spine today to r/o other etiology like vertebral fracture or evidence of disc space narrowing, eval for arthritic burden. No red flags today. Pt agrees with plan.      Relevant Orders   DG Lumbar Spine Complete       Follow up plan: Return if symptoms worsen or fail to improve.  Ria Bush, MD

## 2015-09-11 NOTE — Patient Instructions (Addendum)
I think this is ongoing lumbar strain. Continue home exercises, tylenol, heating pad.  Lumbar xrays today to further evaluation ongoing pain. Usually this improves over 4-6 weeks. Let us know if weakness develops, bowel/bladder accidents, fever, or shooting pain down legs with numbness.

## 2015-09-11 NOTE — Progress Notes (Signed)
Pre visit review using our clinic review tool, if applicable. No additional management support is needed unless otherwise documented below in the visit note. 

## 2015-09-11 NOTE — Assessment & Plan Note (Signed)
Anticipate ongoing lumbar strain given still relatively short duration. Anticipate continued improvement over next 1-2 wks.  Suggested scheduled tylenol 1000mg  bid x 1 wk. xrays lumbar spine today to r/o other etiology like vertebral fracture or evidence of disc space narrowing, eval for arthritic burden. No red flags today. Pt agrees with plan.

## 2015-09-13 ENCOUNTER — Encounter: Payer: Self-pay | Admitting: Family Medicine

## 2015-09-25 ENCOUNTER — Other Ambulatory Visit: Payer: Self-pay

## 2015-09-25 MED ORDER — METOPROLOL TARTRATE 25 MG PO TABS: 25.0000 mg | ORAL_TABLET | Freq: Two times a day (BID) | ORAL | Status: DC

## 2015-10-04 ENCOUNTER — Telehealth: Payer: Self-pay

## 2015-10-04 DIAGNOSIS — M545 Low back pain, unspecified: Secondary | ICD-10-CM

## 2015-10-04 NOTE — Telephone Encounter (Signed)
Pt had xray on back on 09/11/15; pt continuing with low back pain in middle of back; pain level now is 6. Pain comes and goes; worse when walking or getting up out of chair.pt taking Tylenol. Pt request cb.

## 2015-10-05 NOTE — Telephone Encounter (Signed)
Pt is okay with going to PT, she would like to go somewhere in [place].

## 2015-10-05 NOTE — Telephone Encounter (Signed)
Referral placed.

## 2015-10-05 NOTE — Telephone Encounter (Signed)
Would offer PT referral as next step. Referral placed.

## 2015-10-19 ENCOUNTER — Ambulatory Visit: Payer: Medicare Other | Attending: Family Medicine

## 2015-10-19 DIAGNOSIS — M6281 Muscle weakness (generalized): Secondary | ICD-10-CM | POA: Insufficient documentation

## 2015-10-19 DIAGNOSIS — M545 Low back pain: Secondary | ICD-10-CM

## 2015-10-19 DIAGNOSIS — R262 Difficulty in walking, not elsewhere classified: Secondary | ICD-10-CM | POA: Diagnosis not present

## 2015-10-19 NOTE — Therapy (Addendum)
[facility] [address] [place], [address] Phone: [phone]   Fax:  [phone]  Physical Therapy Evaluation  Patient Details  Name: BUENA MCKELLER MRN: [id number] Date of Birth: 1929-08-05 Referring Provider: Ria Bush, MD  Encounter Date: 10/19/2015      PT End of Session - 10/19/15 1319    Visit Number 1   Number of Visits 9   Date for PT Re-Evaluation 11/16/15   Authorization Type 1   Authorization Time Period of 10   PT Start Time 1319  Pt arrived late (got lost)   PT Stop Time 1403   PT Time Calculation (min) 44 min   Activity Tolerance Patient tolerated treatment well   Behavior During Therapy [facility] for tasks assessed/performed      Past Medical History  Diagnosis Date  . GERD (gastroesophageal reflux disease) 05/1999  . Hyperlipidemia 12/2003  . Hypertension 1990  . Osteoarthritis 08/21/1989  . Osteoporosis with fracture 11/1997    with compression fracture T12  . DDD (degenerative disc disease), lumbosacral 12/06/1992  . History of shingles     ophthalmic, takes acyclovir daily  . Cancer ([place])     skin cancer  . Paroxysmal atrial flutter ([place]) 03/16/2012    on coumadin  . RLS (restless legs syndrome)   . BCC (basal cell carcinoma of skin) 05/2011    on nose  . Abdominal aortic atherosclerosis ([place]) 09/2015    by xray  . Gallstones 09/2015    incidentally by xray  . Anxiety   . Chest pain     angina/chest pain  . Rheumatoid arthritis [facility])     Past Surgical History  Procedure Laterality Date  . Abdominal hysterectomy  Age 49 - 47    S/P TAH  . Cervical discectomy  1992    Fusion (Dr. Joya Salm)  . Esophagogastroduodenoscopy  01/01/2002    with ulcer, bx. neg; + stricture gastric ulcer with hemorrhage  . Esophagogastroduodenoscopy  04/23/2004    Esophageal stricture -- dilated  . Abdominal ultrasound  04/23/2004    Gallstones  . Cataract extraction  05/2010    left  eye  . Skin cancer excision  05/20/2011    BCC from nose  . US echocardiography  03/2012    in flutter, EF 60%, mild-mod aortic, mitral, tricuspid regurg, mildly dilated LA  . Nuclear stress test  03/2012    no ischemia    There were no vitals filed for this visit.       Subjective Assessment - 10/19/15 1321    Subjective Back pain: 5-6/10 currently. 10/10 at worst. No radicular symptoms.   Medical intake form filled out after session   Pertinent History Pt states she had pain in her R groin area which went away. Just has pain in her lower back. Had imaging which revealed a bulging disc. Back pain began about 5-6 weeks ago. Pt was packing up stuff and was dragging stuff downstairs in preparation to move. Pain got worse the next day (sometime in June 2017). Denies bowel or bladder problems or saddle anesthesia.     Patient Stated Goals "I just want it to quit hurting."   Currently in Pain? Yes   Pain Score 6   5-6/10   Pain Location Back   Pain Orientation Lower   Pain Descriptors / Indicators Aching;Burning   Pain Type Acute pain   Pain Onset More than a month ago   Pain Frequency Constant  throughout the day   Aggravating Factors  in the morning getting out of bed, first get up from after sitting for a few minutes, walking, vacuuming, mowing her yard (riding mower)   Pain Relieving Factors Tylenol, Bengay   Multiple Pain Sites No            [facility] PT Assessment - 10/19/15 1337    Assessment   Medical Diagnosis Midline low back pain without sciatica   Referring Provider Ria Bush, MD   Onset Date/Surgical Date 09/09/15   Prior Therapy No known physical therapy for current condition   Precautions   Precaution Comments No known precautions   Restrictions   Other Position/Activity Restrictions No known restrictions   Balance Screen   Has the patient fallen in the past 6 months No   Has the patient had a decrease in activity level because of a fear of falling?  No    Is the patient reluctant to leave their home because of a fear of falling?  No   Home Environment   Additional Comments Lives alone. Home has 2 steps, no rails. 13 steps to get to second floor, R rail   Prior Function   Vocation Retired   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 L lumbar side bend until L1/L2. R side bend around L1/L2 area   AROM   Lumbar Flexion full with low back pain reproduction   Lumbar Extension limited with low back discomfort (not as much as leaning forward)   Lumbar - Right Side Bend limited with reproduction of low back pain   Lumbar - Left Side Bend limited with low back pulling (not as bad as R side bending)    Lumbar - Right Rotation limited with low back pulling and pain   Lumbar - Left Rotation limited with low back discomfort (not as bad as R rotation)   Strength   Right Hip Flexion 4/5  with low back pain   Right Hip Extension 3/5  with low back tightness   Left Hip Flexion 4/5   Left Hip Extension 3+/5  with low back pain   Right Knee Flexion 4+/5   Right Knee Extension 5/5   Left Knee Flexion 4/5   Left Knee Extension 5/5   Palpation   Palpation comment TTP posterior sacral area.    Ambulation/Gait   Gait Comments R hip and trunk rotation during L LE strance phase, R lateral lean during R LE stance phase. Slight decreased stance R LE.                            PT Education - 10/19/15 2054    Education provided Yes   Education Details plan of care   Person(s) Educated Patient   Methods Explanation   Comprehension Verbalized understanding             PT  Long Term Goals - 10/19/15 2110    PT LONG TERM GOAL #1   Title Patient will have a decrease in back pain to 5/10 or less at worst to promote ability to perform chores, and walk  with less pain.    Baseline 10/10 back pain at worst.    Time 4   Period Weeks   Status New   PT LONG TERM GOAL #2   Title Patient will improve bilateral glute max strength by at least 1/2 MMT grade to promote ability to perform standing tasks with less back pain.    Time 4   Period Weeks   Status New   PT LONG TERM GOAL #3   Title Patient will improve her Modified Oswestry Low Back Pain Disability Questionnaire score by at least 6 points as a demonstration of improved function.    Baseline 44%   Time 4   Period Weeks   Status New               Plan - 10/19/15 2055    Clinical Impression Statement Patient is an 82 year old female who came to physical therapy secondary to low back pain. She also presents with altered gait pattern and posture, reproduction of symptoms with lumbar flexion, extension, R side bend, and R rotation; positive special test suggesting lumbopelvic involvement;  bilateral hip  weakness, and difficulty performing functional tasks such as walking, chores, and vacuuming.  Patient will benefit from  skilled physical therapy services to address the aforementioned deficits.    Rehab Potential Good   Clinical Impairments Affecting Rehab Potential pain, age   PT Frequency 2x / week   PT Duration 4 weeks   PT Treatment/Interventions Therapeutic exercise;Therapeutic activities;Manual techniques;Aquatic Therapy;Electrical Stimulation;Patient/family education;Neuromuscular re-education   PT Next Visit Plan Mobility, hip strengthening, posture   Consulted and Agree with Plan of Care Patient      Patient will benefit from skilled therapeutic intervention in order to improve the following deficits and impairments:  Pain, Abnormal gait, Decreased strength, Improper body mechanics, Difficulty  walking, Postural dysfunction  Visit Diagnosis: Midline low back pain, with sciatica presence unspecified - Plan: PT plan of care cert/re-cert  Muscle weakness (generalized) - Plan: PT plan of care cert/re-cert  Difficulty in walking, not elsewhere classified - Plan: PT plan of care cert/re-cert      G-Codes - [id number]    Functional Assessment Tool Used Modified Oswestry Low Back Pain Disability Questionnaire, patient interview, clinical presentation   Functional Limitation Mobility: Walking and moving around   Mobility: Walking and Moving Around Current Status [id number]) At least 40 percent but less than 60 percent impaired, limited or restricted   Mobility: Walking and Moving Around Goal Status [phone]) At least 20 percent but less than 40 percent impaired, limited or restricted       Problem List Patient Active Problem List   Diagnosis Date Noted  . Abdominal aortic atherosclerosis ([place]) 09/07/2015  . Insomnia 06/08/2015  . Health maintenance examination 12/08/2014  . Advanced care planning/counseling discussion 12/08/2014  . Chronic leg pain 10/13/2014  . Renal insufficiency 10/30/2013  . Fatigue 10/28/2013  . Encounter for therapeutic drug monitoring 09/01/2013  . Persistent atrial fibrillation ([place]) 04/24/2013  . Medicare annual wellness visit, subsequent 04/14/2012  . Paroxysmal atrial flutter ([place]) 03/16/2012  . Atypical chest pain 03/16/2012  . RLS (restless legs syndrome) 02/07/2012  . Lower back pain 12/18/2009  . MDD (major depressive disorder), recurrent episode ([place]) 12/10/2007  . POSTHERPETIC NEURALGIA 04/20/2007  . HERPES ZOSTER 04/13/2007  . HLD (hyperlipidemia) 11/24/2006  . PLMD (periodic limb movement disorder) 11/24/2006  . Essential hypertension 11/24/2006  . GERD 11/24/2006  . GASTRIC ULCER 11/24/2006  . OSTEOARTHRITIS 11/24/2006  .  Osteoporosis 11/24/2006   Thank you for your referral.  Joneen Boers PT, DPT   10/19/2015, 9:46 PM  [facility] [address] [place], [address] Phone: [phone]   Fax:  [phone]  Name: JAYLAA NODA MRN: [id number] Date of Birth: May 11, 1929

## 2015-10-24 ENCOUNTER — Ambulatory Visit: Payer: Medicare Other

## 2015-10-24 DIAGNOSIS — M545 Low back pain: Secondary | ICD-10-CM

## 2015-10-24 DIAGNOSIS — M6281 Muscle weakness (generalized): Secondary | ICD-10-CM

## 2015-10-24 DIAGNOSIS — R262 Difficulty in walking, not elsewhere classified: Secondary | ICD-10-CM | POA: Diagnosis not present

## 2015-10-24 NOTE — Patient Instructions (Signed)
Supine Knee to Chest   You can use a towel behind your right thigh to help bring your knee to your chest.   Lie on back. Gently pull right knee toward chest. Hold _5__ seconds.  Repeat _10__ times per session. Do _3__ sessions per day.  Copyright  VHI. All rights reserved.

## 2015-10-24 NOTE — Therapy (Signed)
[place] [address], [place], [address] Phone: [phone]   Fax:  [phone]  Physical Therapy Treatment  Patient Details  Name: [name] MRN: [id number] Date of Birth: [birth year] Referring Provider: Ria Bush, MD  Encounter Date: 10/24/2015      PT End of Session - 10/24/15 1259    Visit Number 2   Number of Visits 9   Date for PT Re-Evaluation 11/16/15   Authorization Type 2   Authorization Time Period of 10   PT Start Time 1259   PT Stop Time 1354  Part of time spent for fire drill   PT Time Calculation (min) 55 min   Activity Tolerance Patient tolerated treatment well   Behavior During Therapy [facility] for tasks assessed/performed      Past Medical History  Diagnosis Date  . GERD (gastroesophageal reflux disease) 05/1999  . Hyperlipidemia 12/2003  . Hypertension 1990  . Osteoarthritis 08/21/1989  . Osteoporosis with fracture 11/1997    with compression fracture T12  . DDD (degenerative disc disease), lumbosacral 12/06/1992  . History of shingles     ophthalmic, takes acyclovir daily  . Cancer ([place])     skin cancer  . Paroxysmal atrial flutter ([place]) 03/16/2012    on coumadin  . RLS (restless legs syndrome)   . BCC (basal cell carcinoma of skin) 05/2011    on nose  . Abdominal aortic atherosclerosis ([place]) 09/2015    by xray  . Gallstones 09/2015    incidentally by xray  . Anxiety   . Chest pain     angina/chest pain  . Rheumatoid arthritis [facility])     Past Surgical History  Procedure Laterality Date  . Abdominal hysterectomy  Age 29 - 65    S/P TAH  . Cervical discectomy  1992    Fusion (Dr. Joya Salm)  . Esophagogastroduodenoscopy  01/01/2002    with ulcer, bx. neg; + stricture gastric ulcer with hemorrhage  . Esophagogastroduodenoscopy  04/23/2004    Esophageal stricture -- dilated  . Abdominal ultrasound  04/23/2004    Gallstones  . Cataract extraction  05/2010     left eye  . Skin cancer excision  05/20/2011    BCC from nose  . US echocardiography  03/2012    in flutter, EF 60%, mild-mod aortic, mitral, tricuspid regurg, mildly dilated LA  . Nuclear stress test  03/2012    no ischemia    There were no vitals filed for this visit.      Subjective Assessment - 10/24/15 1258    Subjective Pt states feeling pain in her R low back 4-5/10 R low back currently. Was 8/10 on Sunday.    Pertinent History Pt states she had pain in her R groin area which went away. Just has pain in her lower back. Had imaging which revealed a bulging disc. Back pain began about 5-6 weeks ago. Pt was packing up stuff and was dragging stuff downstairs in preparation to move. Pain got worse the next day (sometime in June 2017). Denies bowel or bladder problems or saddle anesthesia.     Patient Stated Goals "I just want it to quit hurting."   Currently in Pain? Yes   Pain Score 5    Pain Onset More than a month ago       Objectives  There-ex  Directed patient with seated R hip extension isometrics with R foot on stool with manual pressure from PT to  promote posterior nutation of R inominate 3x5 with 5 seconds  Seated bilateral shoulder extension resisting yellow band 8x with R foot on stool  supine R knee to chest 10x3 with 5 second holds. Possible decrease in R low back pain.   Reviewed and given as part of her HEP. Pt demonstrated and verbalized understanding.  standing R shoulder adduction resisting red band 2x5 with 3-5 seconds (possible decrease in pain)    Improved exercise technique, movement at target joints, use of target muscles after mod verbal, visual, tactile cues.     Manual therapy   STM R hip flexors (possible decrease in pain)    Pt tolerated session well without aggravation of symptoms. Reproduction of R low back pain with palpation to R iliopsoas area at her iliac crest. Possible decrease in R low back pain after manual therapy, standing R  shoulder adduction, and supine R knee to chest but patient uncertain.                    PT Education - 10/24/15 2006    Education provided Yes   Education Details ther-ex, HEP   Person(s) Educated Patient   Methods Explanation;Demonstration;Tactile cues;Verbal cues;Handout   Comprehension Verbalized understanding;Returned demonstration             PT Long Term Goals - 10/19/15 2110    PT LONG TERM GOAL #1   Title Patient will have a decrease in back pain to 5/10 or less at worst to promote ability to perform chores, and walk with less pain.    Baseline 10/10 back pain at worst.    Time 4   Period Weeks   Status New   PT LONG TERM GOAL #2   Title Patient will improve bilateral glute max strength by at least 1/2 MMT grade to promote ability to perform standing tasks with less back pain.    Time 4   Period Weeks   Status New   PT LONG TERM GOAL #3   Title Patient will improve her Modified Oswestry Low Back Pain Disability Questionnaire score by at least 6 points as a demonstration of improved function.    Baseline 44%   Time 4   Period Weeks   Status New               Plan - 10/24/15 1258    Clinical Impression Statement Pt tolerated session well without aggravation of symptoms. Reproduction of R low back pain with palpation to R iliopsoas area at her iliac crest. Possible decrease in R low back pain after manual therapy, standing R shoulder adduction, and supine R knee to chest but patient uncertain.    Rehab Potential Good   Clinical Impairments Affecting Rehab Potential pain, age   PT Frequency 2x / week   PT Duration 4 weeks   PT Treatment/Interventions Therapeutic exercise;Therapeutic activities;Manual techniques;Aquatic Therapy;Electrical Stimulation;Patient/family education;Neuromuscular re-education   PT Next Visit Plan Mobility, hip strengthening, posture   Consulted and Agree with Plan of Care Patient      Patient will benefit from  skilled therapeutic intervention in order to improve the following deficits and impairments:  Pain, Abnormal gait, Decreased strength, Improper body mechanics, Difficulty walking, Postural dysfunction  Visit Diagnosis: Midline low back pain, with sciatica presence unspecified  Muscle weakness (generalized)  Difficulty in walking, not elsewhere classified     Problem List Patient Active Problem List   Diagnosis Date Noted  . Abdominal aortic atherosclerosis ([place]) 09/07/2015  . Insomnia  06/08/2015  . Health maintenance examination 12/08/2014  . Advanced care planning/counseling discussion 12/08/2014  . Chronic leg pain 10/13/2014  . Renal insufficiency 10/30/2013  . Fatigue 10/28/2013  . Encounter for therapeutic drug monitoring 09/01/2013  . Persistent atrial fibrillation ([place]) 04/24/2013  . Medicare annual wellness visit, subsequent 04/14/2012  . Paroxysmal atrial flutter ([place]) 03/16/2012  . Atypical chest pain 03/16/2012  . RLS (restless legs syndrome) 02/07/2012  . Lower back pain 12/18/2009  . MDD (major depressive disorder), recurrent episode ([place]) 12/10/2007  . POSTHERPETIC NEURALGIA 04/20/2007  . HERPES ZOSTER 04/13/2007  . HLD (hyperlipidemia) 11/24/2006  . PLMD (periodic limb movement disorder) 11/24/2006  . Essential hypertension 11/24/2006  . GERD 11/24/2006  . GASTRIC ULCER 11/24/2006  . OSTEOARTHRITIS 11/24/2006  . Osteoporosis 11/24/2006   Joneen Boers PT, DPT   10/24/2015, 8:20 PM  [place] [facility] [address], [place], [address] Phone: [phone]   Fax:  [phone]  Name: [name] MRN: [id number] Date of Birth: 03-21-1930

## 2015-10-26 ENCOUNTER — Ambulatory Visit: Payer: Medicare Other

## 2015-10-27 ENCOUNTER — Other Ambulatory Visit: Payer: Self-pay | Admitting: *Deleted

## 2015-10-27 MED ORDER — ATORVASTATIN CALCIUM 20 MG PO TABS: 20.0000 mg | ORAL_TABLET | Freq: Every day | ORAL | Status: DC

## 2015-10-27 MED ORDER — METOPROLOL TARTRATE 25 MG PO TABS: 25.0000 mg | ORAL_TABLET | Freq: Two times a day (BID) | ORAL | Status: DC

## 2015-10-27 MED ORDER — DILTIAZEM HCL ER COATED BEADS 120 MG PO CP24: 120.0000 mg | ORAL_CAPSULE | Freq: Every evening | ORAL | Status: DC

## 2015-10-27 NOTE — Telephone Encounter (Signed)
Requested Prescriptions   Signed Prescriptions Disp Refills  . atorvastatin (LIPITOR) 20 MG tablet 30 tablet 0    Sig: Take 1 tablet (20 mg total) by mouth daily.    Authorizing Provider: [name] A    Ordering User: Eugenio Hoes, MARINA C  . metoprolol tartrate (LOPRESSOR) 25 MG tablet 60 tablet 0    Sig: Take 1 tablet (25 mg total) by mouth 2 (two) times daily.    Authorizing Provider: [name] A    Ordering User: Britt Bottom

## 2015-10-27 NOTE — Telephone Encounter (Signed)
Requested Prescriptions   Signed Prescriptions Disp Refills  . diltiazem (CARDIZEM CD) 120 MG 24 hr capsule 90 capsule 3    Sig: Take 1 capsule (120 mg total) by mouth every evening.    Authorizing Provider: [name] A    Ordering User: Britt Bottom

## 2015-10-31 ENCOUNTER — Ambulatory Visit: Payer: Medicare Other

## 2015-10-31 DIAGNOSIS — M545 Low back pain: Secondary | ICD-10-CM

## 2015-10-31 DIAGNOSIS — R262 Difficulty in walking, not elsewhere classified: Secondary | ICD-10-CM | POA: Diagnosis not present

## 2015-10-31 DIAGNOSIS — M6281 Muscle weakness (generalized): Secondary | ICD-10-CM

## 2015-10-31 NOTE — Patient Instructions (Signed)
  Axial Rotation Stretch (Sitting)    Sitting on a chair: turn your body to the left only. Hold a stretch for 5 seconds. Repeat __10__ times. Do __3__ sessions per day.  Copyright  VHI. All rights reserved.

## 2015-10-31 NOTE — Therapy (Signed)
[place] [facility] [address] [place], [address] Phone: [phone]   Fax:  [phone]  Physical Therapy Treatment  Patient Details  Name: [name] MRN: [id number] Date of Birth: [birth year] Referring Provider: Ria Bush, MD  Encounter Date: 10/31/2015      PT End of Session - 10/31/15 1346    Visit Number 3   Number of Visits 9   Date for PT Re-Evaluation 11/16/15   Authorization Type 3   Authorization Time Period of 10   PT Start Time 1346   PT Stop Time 1434   PT Time Calculation (min) 48 min   Activity Tolerance Patient tolerated treatment well   Behavior During Therapy [facility] for tasks assessed/performed      Past Medical History:  Diagnosis Date  . Abdominal aortic atherosclerosis ([place]) 09/2015   by xray  . Anxiety   . BCC (basal cell carcinoma of skin) 05/2011   on nose  . Cancer ([place])    skin cancer  . Chest pain    angina/chest pain  . DDD (degenerative disc disease), lumbosacral 12/06/1992  . Gallstones 09/2015   incidentally by xray  . GERD (gastroesophageal reflux disease) 05/1999  . History of shingles    ophthalmic, takes acyclovir daily  . Hyperlipidemia 12/2003  . Hypertension 1990  . Osteoarthritis 08/21/1989  . Osteoporosis with fracture 11/1997   with compression fracture T12  . Paroxysmal atrial flutter ([place]) 03/16/2012   on coumadin  . Rheumatoid arthritis ([place])   . RLS (restless legs syndrome)     Past Surgical History:  Procedure Laterality Date  . ABDOMINAL HYSTERECTOMY  Age 82 - 28   S/P TAH  . abdominal ultrasound  04/23/2004   Gallstones  . CATARACT EXTRACTION  05/2010   left eye  . CERVICAL DISCECTOMY  1992   Fusion (Dr. Joya Salm)  . ESOPHAGOGASTRODUODENOSCOPY  01/01/2002   with ulcer, bx. neg; + stricture gastric ulcer with hemorrhage  . ESOPHAGOGASTRODUODENOSCOPY  04/23/2004   Esophageal stricture -- dilated  . nuclear stress test  03/2012   no ischemia  . SKIN CANCER EXCISION  05/20/2011   BCC from nose  . US ECHOCARDIOGRAPHY  03/2012   in flutter, EF 60%, mild-mod aortic, mitral, tricuspid regurg, mildly dilated LA    There were no vitals filed for this visit.      Subjective Assessment - 10/31/15 1349    Subjective Pt states that her chest pain is from her A-fib and her doctor sees her for that. Back hurts R and center and across. When she takes a deep breath, her back bothers her. Pt states she ate some bar be que last week which made her sick to her stomach. Feels better.  4-5/10 back pain currently.    Pertinent History Pt states she had pain in her R groin area which went away. Just has pain in her lower back. Had imaging which revealed a bulging disc. Back pain began about 5-6 weeks ago. Pt was packing up stuff and was dragging stuff downstairs in preparation to move. Pain got worse the next day (sometime in June 2017). Denies bowel or bladder problems or saddle anesthesia.     Patient Stated Goals "I just want it to quit hurting."   Currently in Pain? Yes   Pain Score 5   4-5/10 currently   Pain Onset More than a month ago   Multiple Pain Sites No  Objectives    Manual therapy   STM R hip flexors    There-ex  Directed patient with R S/L bow and arrow to promote L thoracic rotation 10x3   Supine R hip extension isometrics leg straight 10x 5 seconds to promote R hip flexor mobility and R glute max strength   Decreased back pain in sitting  Log rolling x 2  Seated L trunk rotation 10x 5 seconds. No back pain when standing per pt.   Reviewed and given as part of her HEP. Pt demonstrated and verbalized understanding.   standing R shoulder adduction resisting red band 2x5 with 5 seconds     Improved exercise technique, movement at target joints, use of target muscles after mod verbal, visual, tactile cues.                      PT Education - 10/31/15 1412    Education  provided Yes   Education Details ther-ex   [employer]) Educated Patient   Methods Explanation;Demonstration;Tactile cues;Verbal cues   Comprehension Verbalized understanding;Returned demonstration             PT Long Term Goals - 10/19/15 2110      PT LONG TERM GOAL #1   Title Patient will have a decrease in back pain to 5/10 or less at worst to promote ability to perform chores, and walk with less pain.    Baseline 10/10 back pain at worst.    Time 4   Period Weeks   Status New     PT LONG TERM GOAL #2   Title Patient will improve bilateral glute max strength by at least 1/2 MMT grade to promote ability to perform standing tasks with less back pain.    Time 4   Period Weeks   Status New     PT LONG TERM GOAL #3   Title Patient will improve her Modified Oswestry Low Back Pain Disability Questionnaire score by at least 6 points as a demonstration of improved function.    Baseline 44%   Time 4   Period Weeks   Status New               Plan - 10/31/15 1440    Clinical Impression Statement Decreased back pain with exercises and movements that promote intervertebral foraminal opening. Decreased back pain after R S/L bows and arrows and seated L trunk rotation.    Rehab Potential Good   Clinical Impairments Affecting Rehab Potential pain, age   PT Frequency 2x / week   PT Duration 4 weeks   PT Treatment/Interventions Therapeutic exercise;Therapeutic activities;Manual techniques;Aquatic Therapy;Electrical Stimulation;Patient/family education;Neuromuscular re-education   PT Next Visit Plan Mobility, hip strengthening, posture   Consulted and Agree with Plan of Care Patient      Patient will benefit from skilled therapeutic intervention in order to improve the following deficits and impairments:  Pain, Abnormal gait, Decreased strength, Improper body mechanics, Difficulty walking, Postural dysfunction  Visit Diagnosis: Midline low back pain, with sciatica presence  unspecified  Muscle weakness (generalized)  Difficulty in walking, not elsewhere classified     Problem List Patient Active Problem List   Diagnosis Date Noted  . Abdominal aortic atherosclerosis ([place]) 09/07/2015  . Insomnia 06/08/2015  . Health maintenance examination 12/08/2014  . Advanced care planning/counseling discussion 12/08/2014  . Chronic leg pain 10/13/2014  . Renal insufficiency 10/30/2013  . Fatigue 10/28/2013  . Encounter for therapeutic drug monitoring 09/01/2013  . Persistent atrial fibrillation ([place])  04/24/2013  . Medicare annual wellness visit, subsequent 04/14/2012  . Paroxysmal atrial flutter ([place]) 03/16/2012  . Atypical chest pain 03/16/2012  . RLS (restless legs syndrome) 02/07/2012  . Lower back pain 12/18/2009  . MDD (major depressive disorder), recurrent episode ([place]) 12/10/2007  . POSTHERPETIC NEURALGIA 04/20/2007  . HERPES ZOSTER 04/13/2007  . HLD (hyperlipidemia) 11/24/2006  . PLMD (periodic limb movement disorder) 11/24/2006  . Essential hypertension 11/24/2006  . GERD 11/24/2006  . GASTRIC ULCER 11/24/2006  . OSTEOARTHRITIS 11/24/2006  . Osteoporosis 11/24/2006    Joneen Boers PT, DPT   10/31/2015, 7:38 PM  [place] [facility] [address] [place], [address] Phone: [phone]   Fax:  [phone]  Name: [name] MRN: [id number] Date of Birth: 07-May-1929

## 2015-11-02 ENCOUNTER — Ambulatory Visit: Payer: Medicare Other

## 2015-11-02 DIAGNOSIS — M6281 Muscle weakness (generalized): Secondary | ICD-10-CM

## 2015-11-02 DIAGNOSIS — R262 Difficulty in walking, not elsewhere classified: Secondary | ICD-10-CM | POA: Diagnosis not present

## 2015-11-02 DIAGNOSIS — M545 Low back pain: Secondary | ICD-10-CM

## 2015-11-02 NOTE — Therapy (Signed)
[place] [facility] [address], [place], [address] Phone: [phone]   Fax:  [phone]  Physical Therapy Treatment  Patient Details  Name: [name] MRN: [id number] Date of Birth: 04-15-1929 Referring Provider: Ria Bush, MD  Encounter Date: 11/02/2015      PT End of Session - 11/02/15 1349    Visit Number 4   Number of Visits 9   Date for PT Re-Evaluation 11/16/15   Authorization Type 4   Authorization Time Period of 10   PT Start Time [id number]   PT Stop Time 1432   PT Time Calculation (min) 43 min   Activity Tolerance Patient tolerated treatment well   Behavior During Therapy [facility] for tasks assessed/performed      Past Medical History:  Diagnosis Date  . Abdominal aortic atherosclerosis ([place]) 09/2015   by xray  . Anxiety   . BCC (basal cell carcinoma of skin) 05/2011   on nose  . Cancer ([place])    skin cancer  . Chest pain    angina/chest pain  . DDD (degenerative disc disease), lumbosacral 12/06/1992  . Gallstones 09/2015   incidentally by xray  . GERD (gastroesophageal reflux disease) 05/1999  . History of shingles    ophthalmic, takes acyclovir daily  . Hyperlipidemia 12/2003  . Hypertension 1990  . Osteoarthritis 08/21/1989  . Osteoporosis with fracture 11/1997   with compression fracture T12  . Paroxysmal atrial flutter ([place]) 03/16/2012   on coumadin  . Rheumatoid arthritis ([place])   . RLS (restless legs syndrome)     Past Surgical History:  Procedure Laterality Date  . ABDOMINAL HYSTERECTOMY  Age 15 - 7   S/P TAH  . abdominal ultrasound  04/23/2004   Gallstones  . CATARACT EXTRACTION  05/2010   left eye  . CERVICAL DISCECTOMY  1992   Fusion (Dr. Joya Salm)  . ESOPHAGOGASTRODUODENOSCOPY  01/01/2002   with ulcer, bx. neg; + stricture gastric ulcer with hemorrhage  . ESOPHAGOGASTRODUODENOSCOPY  04/23/2004   Esophageal stricture -- dilated  . nuclear stress test  03/2012   no ischemia  . SKIN CANCER EXCISION  05/20/2011   BCC from nose  . US ECHOCARDIOGRAPHY  03/2012   in flutter, EF 60%, mild-mod aortic, mitral, tricuspid regurg, mildly dilated LA    There were no vitals filed for this visit.      Subjective Assessment - 11/02/15 1349    Subjective Pt states that her back is killing her this morning. 5/10 back pain currently. Pt adds that putting her hand behind her back to help it extend when she walks helps.    Pertinent History Pt states she had pain in her R groin area which went away. Just has pain in her lower back. Had imaging which revealed a bulging disc. Back pain began about 5-6 weeks ago. Pt was packing up stuff and was dragging stuff downstairs in preparation to move. Pain got worse the next day (sometime in June 2017). Denies bowel or bladder problems or saddle anesthesia.     Patient Stated Goals "I just want it to quit hurting."   Currently in Pain? Yes   Pain Score 5    Pain Onset More than a month ago         Objectives   There-ex  Sitting with lumbar towel roll x 2 min  Standing bilateral shoulder extension resisting red band 10x3 to promote gentle trunk extension  Standing back extension with slight L  rotation 10x. No back pain afterwards. More ease with gait observed.  Prone on elbows  2 min, then 1 min  Seated gentle back extension leaning to the L 5x  Reviewed and given as part of her HEP. Pt demonstrated and verbalized understanding.   standing R shoulder adduction resisting red band 10x with 5 second holds  Seated bilateral shoulder ER with scapular retraction resisting red band 10x  Standing hip extension 5x each side  Sit <> stand from low mat table with transversus abdominis and pelvic floor contraction 1x     Improved exercise technique, movement at target joints, use of target muscles after mod verbal, visual, tactile cues.      Decreased back pain with extension based exercises targeting the L side.  More ease with walking after standing back extension targeting the L side. Per pt, back symptoms feel more like a discomfort instead of pain after session.                          PT Education - 11/02/15 1352    Education provided Yes   Education Details ther-ex, HEP   Person(s) Educated Patient   Methods Explanation;Demonstration;Tactile cues;Verbal cues;Handout   Comprehension Verbalized understanding;Returned demonstration             PT Long Term Goals - 10/19/15 2110      PT LONG TERM GOAL #1   Title Patient will have a decrease in back pain to 5/10 or less at worst to promote ability to perform chores, and walk with less pain.    Baseline 10/10 back pain at worst.    Time 4   Period Weeks   Status New     PT LONG TERM GOAL #2   Title Patient will improve bilateral glute max strength by at least 1/2 MMT grade to promote ability to perform standing tasks with less back pain.    Time 4   Period Weeks   Status New     PT LONG TERM GOAL #3   Title Patient will improve her Modified Oswestry Low Back Pain Disability Questionnaire score by at least 6 points as a demonstration of improved function.    Baseline 44%   Time 4   Period Weeks   Status New               Plan - 11/02/15 1549    Clinical Impression Statement Decreased back pain with extension based exercises targeting the L side. More ease with walking after standing back extension targeting the L side. Per pt, back symptoms feel more like a discomfort instead of pain after session.    Rehab Potential Good   Clinical Impairments Affecting Rehab Potential pain, age   PT Frequency 2x / week   PT Duration 4 weeks   PT Treatment/Interventions Therapeutic exercise;Therapeutic activities;Manual techniques;Aquatic Therapy;Electrical Stimulation;Patient/family education;Neuromuscular re-education   PT Next Visit Plan Mobility, hip strengthening, posture   Consulted and Agree with Plan of Care  Patient      Patient will benefit from skilled therapeutic intervention in order to improve the following deficits and impairments:  Pain, Abnormal gait, Decreased strength, Improper body mechanics, Difficulty walking, Postural dysfunction  Visit Diagnosis: Midline low back pain, with sciatica presence unspecified  Muscle weakness (generalized)  Difficulty in walking, not elsewhere classified     Problem List Patient Active Problem List   Diagnosis Date Noted  . Abdominal aortic atherosclerosis ([place]) 09/07/2015  . Insomnia  06/08/2015  . Health maintenance examination 12/08/2014  . Advanced care planning/counseling discussion 12/08/2014  . Chronic leg pain 10/13/2014  . Renal insufficiency 10/30/2013  . Fatigue 10/28/2013  . Encounter for therapeutic drug monitoring 09/01/2013  . Persistent atrial fibrillation ([place]) 04/24/2013  . Medicare annual wellness visit, subsequent 04/14/2012  . Paroxysmal atrial flutter ([place]) 03/16/2012  . Atypical chest pain 03/16/2012  . RLS (restless legs syndrome) 02/07/2012  . Lower back pain 12/18/2009  . MDD (major depressive disorder), recurrent episode ([place]) 12/10/2007  . POSTHERPETIC NEURALGIA 04/20/2007  . HERPES ZOSTER 04/13/2007  . HLD (hyperlipidemia) 11/24/2006  . PLMD (periodic limb movement disorder) 11/24/2006  . Essential hypertension 11/24/2006  . GERD 11/24/2006  . GASTRIC ULCER 11/24/2006  . OSTEOARTHRITIS 11/24/2006  . Osteoporosis 11/24/2006    Joneen Boers PT, DPT   11/02/2015, 3:56 PM  [facility] [address] [place], [address] Phone: [phone]   Fax:  [phone]  Name: [name] MRN: [id number] Date of Birth: 01/20/1930

## 2015-11-02 NOTE — Patient Instructions (Signed)
  Sitting on a chair:   Sit up straight and gently lean back to the left comfortably.   Slowly perform 10x  Do 2-3 sets daily.

## 2015-11-07 ENCOUNTER — Ambulatory Visit: Payer: Medicare Other | Attending: Family Medicine

## 2015-11-07 DIAGNOSIS — R262 Difficulty in walking, not elsewhere classified: Secondary | ICD-10-CM | POA: Diagnosis not present

## 2015-11-07 DIAGNOSIS — M545 Low back pain: Secondary | ICD-10-CM | POA: Insufficient documentation

## 2015-11-07 DIAGNOSIS — M6281 Muscle weakness (generalized): Secondary | ICD-10-CM | POA: Diagnosis not present

## 2015-11-07 NOTE — Patient Instructions (Addendum)
  Strengthening: Resisted Extension    Hold  Red band one in each hand, arm forward. Pull arm back, elbow straight. Hold for 5 seconds Repeat ___10_ times per set. Do _3___ sets per session. Do _1__ sessions per day.  http://orth.exer.us/832   Copyright  VHI. All rights reserved.     Patient was also recommended to activate her abdominal muscles comfortably when walking to try to help decrease central low back pain. Pt demonstrated and verbalized understanding.

## 2015-11-07 NOTE — Therapy (Signed)
[place] [facility] [address], [place], [address] Phone: [phone]   Fax:  [phone]  Physical Therapy Treatment  Patient Details  Name: [name] MRN: [id number] Date of Birth: [birth year] Referring Provider: Ria Bush, MD  Encounter Date: 11/07/2015      PT End of Session - 11/07/15 1534    Visit Number 5   Number of Visits 9   Date for PT Re-Evaluation 11/16/15   Authorization Type 5   Authorization Time Period of 10   PT Start Time [id number]   PT Stop Time 1617   PT Time Calculation (min) 42 min   Activity Tolerance Patient tolerated treatment well   Behavior During Therapy [facility] for tasks assessed/performed      Past Medical History:  Diagnosis Date  . Abdominal aortic atherosclerosis ([place]) 09/2015   by xray  . Anxiety   . BCC (basal cell carcinoma of skin) 05/2011   on nose  . Cancer ([place])    skin cancer  . Chest pain    angina/chest pain  . DDD (degenerative disc disease), lumbosacral 12/06/1992  . Gallstones 09/2015   incidentally by xray  . GERD (gastroesophageal reflux disease) 05/1999  . History of shingles    ophthalmic, takes acyclovir daily  . Hyperlipidemia 12/2003  . Hypertension 1990  . Osteoarthritis 08/21/1989  . Osteoporosis with fracture 11/1997   with compression fracture T12  . Paroxysmal atrial flutter ([place]) 03/16/2012   on coumadin  . Rheumatoid arthritis ([place])   . RLS (restless legs syndrome)     Past Surgical History:  Procedure Laterality Date  . ABDOMINAL HYSTERECTOMY  Age 82 - 41   S/P TAH  . abdominal ultrasound  04/23/2004   Gallstones  . CATARACT EXTRACTION  05/2010   left eye  . CERVICAL DISCECTOMY  1992   Fusion (Dr. Joya Salm)  . ESOPHAGOGASTRODUODENOSCOPY  01/01/2002   with ulcer, bx. neg; + stricture gastric ulcer with hemorrhage  . ESOPHAGOGASTRODUODENOSCOPY  04/23/2004   Esophageal stricture -- dilated  . nuclear stress test  03/2012   no  ischemia  . SKIN CANCER EXCISION  05/20/2011   BCC from nose  . US ECHOCARDIOGRAPHY  03/2012   in flutter, EF 60%, mild-mod aortic, mitral, tricuspid regurg, mildly dilated LA    There were no vitals filed for this visit.      Subjective Assessment - 11/07/15 1536    Subjective Pt states feeling back pain in the center of her low back.    Pertinent History Pt states she had pain in her R groin area which went away. Just has pain in her lower back. Had imaging which revealed a bulging disc. Back pain began about 5-6 weeks ago. Pt was packing up stuff and was dragging stuff downstairs in preparation to move. Pain got worse the next day (sometime in June 2017). Denies bowel or bladder problems or saddle anesthesia.     Patient Stated Goals "I just want it to quit hurting."   Currently in Pain? Yes   Pain Score 5   4-5/10 currently   Pain Onset More than a month ago        Objectives   There-ex  Directed patient with standing back extension with slight L rotation 5 x 5 seconds. Increased low back pulling discomfort today.   Standing bilateral shoulder extension resisting red band 10x3 with 5 second holds. Decreased R paraspinal muscle tension.   Reviewed and given as  part of her HEP. Pt demonstrated and verbalized understanding.  Seated forward trunk flexion using ball 10x 5 seconds for gentle low back stretch.   Forward step up onto 1st regular step 5x3 with R LE with bilateral UE assist  R posterior pelvic nutation observed during movement. R posterior hip discomfort.  Seated R hip flexion isometrics 3x5 with 5 second holds   Walking with activation of abdominal muscles 50 ft. Decreased central and R low back pain when walking (taking a step with R LE).   Pt was recommended to keep her abdomen tight when walking at home. Pt demonstrated and verbalized understanding.     Improved exercise technique, movement at target joints, use of target muscles after mod verbal, visual,  tactile cues.      Increased low back symptoms with trunk extension today and increased R posterior hip discomfort when trying to correct anterior nutation of R pelvis today. Decreased back pain with walking with activation of core muscles.                PT Education - 11/07/15 1710    Education provided Yes   Education Details ther-ex, HEP   Person(s) Educated Patient   Methods Explanation;Demonstration;Tactile cues;Verbal cues;Handout   Comprehension Verbalized understanding;Returned demonstration             PT Long Term Goals - 10/19/15 2110      PT LONG TERM GOAL #1   Title Patient will have a decrease in back pain to 5/10 or less at worst to promote ability to perform chores, and walk with less pain.    Baseline 10/10 back pain at worst.    Time 4   Period Weeks   Status New     PT LONG TERM GOAL #2   Title Patient will improve bilateral glute max strength by at least 1/2 MMT grade to promote ability to perform standing tasks with less back pain.    Time 4   Period Weeks   Status New     PT LONG TERM GOAL #3   Title Patient will improve her Modified Oswestry Low Back Pain Disability Questionnaire score by at least 6 points as a demonstration of improved function.    Baseline 44%   Time 4   Period Weeks   Status New               Plan - 11/07/15 1722    Clinical Impression Statement Increased low back symptoms with trunk extension today and increased R posterior hip discomfort when trying to correct anterior nutation of R pelvis today. Decreased back pain with walking with activation of core muscles.     Rehab Potential Good   Clinical Impairments Affecting Rehab Potential pain, age   PT Frequency 2x / week   PT Duration 4 weeks   PT Treatment/Interventions Therapeutic exercise;Therapeutic activities;Manual techniques;Aquatic Therapy;Electrical Stimulation;Patient/family education;Neuromuscular re-education   PT Next Visit Plan Mobility, hip  strengthening, posture   Consulted and Agree with Plan of Care Patient      Patient will benefit from skilled therapeutic intervention in order to improve the following deficits and impairments:  Pain, Abnormal gait, Decreased strength, Improper body mechanics, Difficulty walking, Postural dysfunction  Visit Diagnosis: Midline low back pain, with sciatica presence unspecified  Muscle weakness (generalized)  Difficulty in walking, not elsewhere classified     Problem List Patient Active Problem List   Diagnosis Date Noted  . Abdominal aortic atherosclerosis ([place]) 09/07/2015  . Insomnia 06/08/2015  .  Health maintenance examination 12/08/2014  . Advanced care planning/counseling discussion 12/08/2014  . Chronic leg pain 10/13/2014  . Renal insufficiency 10/30/2013  . Fatigue 10/28/2013  . Encounter for therapeutic drug monitoring 09/01/2013  . Persistent atrial fibrillation ([place]) 04/24/2013  . Medicare annual wellness visit, subsequent 04/14/2012  . Paroxysmal atrial flutter ([place]) 03/16/2012  . Atypical chest pain 03/16/2012  . RLS (restless legs syndrome) 02/07/2012  . Lower back pain 12/18/2009  . MDD (major depressive disorder), recurrent episode ([place]) 12/10/2007  . POSTHERPETIC NEURALGIA 04/20/2007  . HERPES ZOSTER 04/13/2007  . HLD (hyperlipidemia) 11/24/2006  . PLMD (periodic limb movement disorder) 11/24/2006  . Essential hypertension 11/24/2006  . GERD 11/24/2006  . GASTRIC ULCER 11/24/2006  . OSTEOARTHRITIS 11/24/2006  . Osteoporosis 11/24/2006    Joneen Boers PT, DPT   11/07/2015, 5:33 PM  [place] [facility] [address], [place], [address] Phone: [phone]   Fax:  [phone]  Name: LISSY WERNING MRN: [id number] Date of Birth: [birth year]

## 2015-11-14 ENCOUNTER — Ambulatory Visit: Payer: Medicare Other

## 2015-11-14 DIAGNOSIS — M545 Low back pain: Secondary | ICD-10-CM

## 2015-11-14 DIAGNOSIS — R262 Difficulty in walking, not elsewhere classified: Secondary | ICD-10-CM | POA: Diagnosis not present

## 2015-11-14 DIAGNOSIS — M6281 Muscle weakness (generalized): Secondary | ICD-10-CM | POA: Diagnosis not present

## 2015-11-14 NOTE — Therapy (Signed)
[place] [facility] [address] [place], [address] Phone: [phone]   Fax:  [phone]  Physical Therapy Treatment  Patient Details  Name: [name] MRN: [id number] Date of Birth: Aug 15, 1929 Referring Provider: Ria Bush, MD  Encounter Date: 11/14/2015      PT End of Session - 11/14/15 1348    Visit Number 6   Number of Visits 15   Date for PT Re-Evaluation 12/07/15   Authorization Type 6   Authorization Time Period of 10   PT Start Time 1348   PT Stop Time 1440   PT Time Calculation (min) 52 min   Activity Tolerance Patient tolerated treatment well   Behavior During Therapy [facility] for tasks assessed/performed      Past Medical History:  Diagnosis Date  . Abdominal aortic atherosclerosis ([place]) 09/2015   by xray  . Anxiety   . BCC (basal cell carcinoma of skin) 05/2011   on nose  . Cancer ([place])    skin cancer  . Chest pain    angina/chest pain  . DDD (degenerative disc disease), lumbosacral 12/06/1992  . Gallstones 09/2015   incidentally by xray  . GERD (gastroesophageal reflux disease) 05/1999  . History of shingles    ophthalmic, takes acyclovir daily  . Hyperlipidemia 12/2003  . Hypertension 1990  . Osteoarthritis 08/21/1989  . Osteoporosis with fracture 11/1997   with compression fracture T12  . Paroxysmal atrial flutter ([place]) 03/16/2012   on coumadin  . Rheumatoid arthritis ([place])   . RLS (restless legs syndrome)     Past Surgical History:  Procedure Laterality Date  . ABDOMINAL HYSTERECTOMY  Age 74 - 29   S/P TAH  . abdominal ultrasound  04/23/2004   Gallstones  . CATARACT EXTRACTION  05/2010   left eye  . CERVICAL DISCECTOMY  1992   Fusion (Dr. Joya Salm)  . ESOPHAGOGASTRODUODENOSCOPY  01/01/2002   with ulcer, bx. neg; + stricture gastric ulcer with hemorrhage  . ESOPHAGOGASTRODUODENOSCOPY  04/23/2004   Esophageal stricture -- dilated  . nuclear stress test  03/2012    no ischemia  . SKIN CANCER EXCISION  05/20/2011   BCC from nose  . US ECHOCARDIOGRAPHY  03/2012   in flutter, EF 60%, mild-mod aortic, mitral, tricuspid regurg, mildly dilated LA    There were no vitals filed for this visit.      Subjective Assessment - 11/14/15 1350    Subjective Has been doing her exercises. It's still doing it (back). The exercises help for that time when she does the exercises but when she sits down and stands back up, it returns. It feels like its getting better. Felt better getting up this morning. Does not know how it will be tomorrow. Not as bad as it has been.  3-4/10 at most for the past 5 days.  3/10 currently   Pertinent History Pt states she had pain in her R groin area which went away. Just has pain in her lower back. Had imaging which revealed a bulging disc. Back pain began about 5-6 weeks ago. Pt was packing up stuff and was dragging stuff downstairs in preparation to move. Pain got worse the next day (sometime in June 2017). Denies bowel or bladder problems or saddle anesthesia.     Patient Stated Goals "I just want it to quit hurting."   Currently in Pain? Yes   Pain Score 3    Pain Onset More than a month ago  [facility] PT Assessment - 11/14/15 1358      Strength   Right Hip Extension 4-/5   Left Hip Extension 4-/5           Objectives   There-ex   Directed patient with prone glute max extension 1x each LE  Reviewed progress/current status with bilateral glute max strength Prone glute max/quad set 5x 5 seconds for 2 sets Supine transversus abdominis contraction 10x5 seconds  Then with pelvic floor contraction 10x5 seconds   then with hip fallouts 4x each LE. L pelvic rotation with L hip fallout. Decreased lumbopelvic control with L hip movement. Increased time to promote quality of movement.   Sit <> stand with transversus abdominis and pelvic floor contraction from mat table and chair 5x  Difficulty due to low back pain      Improved exercise technique, movement at target joints, use of target muscles after mod verbal, visual, tactile cues.    Pt has demonstrated overall decreased low back pain level at worst and improved glute max strength since initial evaluation. Pt still demonstrates pain and discomfort in her low back as well as difficulty performing functional tasks and would benefit from continued skilled physical therapy intervention to address the aforementioned deficits.                    PT Education - 11/14/15 1408    Education provided Yes   Education Details ther-ex, HEP (add another 3 sets of 10 as tolerated )   Person(s) Educated Patient   Methods Explanation;Demonstration;Tactile cues;Verbal cues   Comprehension Verbalized understanding;Returned demonstration             PT Long Term Goals - 11/14/15 2003      PT LONG TERM GOAL #1   Title Patient will have a decrease in back pain to 5/10 or less at worst to promote ability to perform chores, and walk with less pain.    Baseline 10/10 back pain at worst. 3-4/10 at most for the past 5 days (11/14/2015)   Time 4   Period Weeks   Status Achieved     PT LONG TERM GOAL #2   Title Patient will improve bilateral glute max strength by at least 1/2 MMT grade to promote ability to perform standing tasks with less back pain.    Time 4   Period Weeks   Status Achieved     PT LONG TERM GOAL #3   Title Patient will improve her Modified Oswestry Low Back Pain Disability Questionnaire score by at least 6 points as a demonstration of improved function.    Baseline 44%   Time 4   Period Weeks   Status On-going               Plan - 11/14/15 1409    Clinical Impression Statement Pt has demonstrated overall decreased low back pain level at worst and improved glute max strength since initial evaluation. Pt still demonstrates pain and discomfort in her low back as well as difficulty performing functional tasks and would  benefit from continued skilled physical therapy intervention to address the aforementioned deficits.    Rehab Potential Good   Clinical Impairments Affecting Rehab Potential pain, age   PT Frequency 2x / week   PT Duration 3 weeks   PT Treatment/Interventions Therapeutic exercise;Therapeutic activities;Manual techniques;Aquatic Therapy;Electrical Stimulation;Patient/family education;Neuromuscular re-education   PT Next Visit Plan Mobility, hip strengthening, posture   Consulted and Agree with Plan of Care Patient  Patient will benefit from skilled therapeutic intervention in order to improve the following deficits and impairments:  Pain, Abnormal gait, Decreased strength, Improper body mechanics, Difficulty walking, Postural dysfunction  Visit Diagnosis: Midline low back pain, with sciatica presence unspecified - Plan: PT plan of care cert/re-cert  Muscle weakness (generalized) - Plan: PT plan of care cert/re-cert  Difficulty in walking, not elsewhere classified - Plan: PT plan of care cert/re-cert     Problem List Patient Active Problem List   Diagnosis Date Noted  . Abdominal aortic atherosclerosis ([place]) 09/07/2015  . Insomnia 06/08/2015  . Health maintenance examination 12/08/2014  . Advanced care planning/counseling discussion 12/08/2014  . Chronic leg pain 10/13/2014  . Renal insufficiency 10/30/2013  . Fatigue 10/28/2013  . Encounter for therapeutic drug monitoring 09/01/2013  . Persistent atrial fibrillation ([place]) 04/24/2013  . Medicare annual wellness visit, subsequent 04/14/2012  . Paroxysmal atrial flutter ([place]) 03/16/2012  . Atypical chest pain 03/16/2012  . RLS (restless legs syndrome) 02/07/2012  . Lower back pain 12/18/2009  . MDD (major depressive disorder), recurrent episode ([place]) 12/10/2007  . POSTHERPETIC NEURALGIA 04/20/2007  . HERPES ZOSTER 04/13/2007  . HLD (hyperlipidemia) 11/24/2006  . PLMD (periodic limb movement disorder) 11/24/2006  .  Essential hypertension 11/24/2006  . GERD 11/24/2006  . GASTRIC ULCER 11/24/2006  . OSTEOARTHRITIS 11/24/2006  . Osteoporosis 11/24/2006    Joneen Boers PT, DPT   11/14/2015, 8:19 PM  [place] [facility] [address], [place], [address] Phone: [phone]   Fax:  [phone]  Name: [name] MRN: [id number] Date of Birth: [birth year]

## 2015-11-16 ENCOUNTER — Ambulatory Visit: Payer: Medicare Other

## 2015-11-16 DIAGNOSIS — M545 Low back pain: Secondary | ICD-10-CM

## 2015-11-16 DIAGNOSIS — M6281 Muscle weakness (generalized): Secondary | ICD-10-CM | POA: Diagnosis not present

## 2015-11-16 DIAGNOSIS — R262 Difficulty in walking, not elsewhere classified: Secondary | ICD-10-CM

## 2015-11-16 NOTE — Therapy (Signed)
[place] [facility] [address], [place], [address] Phone: [phone]   Fax:  [phone]  Physical Therapy Treatment  Patient Details  Name: [name] MRN: [id number] Date of Birth: 11-29-1929 Referring Provider: Ria Bush, MD  Encounter Date: 11/16/2015      PT End of Session - 11/16/15 1435    Visit Number 7   Number of Visits 15   Date for PT Re-Evaluation 12/07/15   Authorization Type 7   Authorization Time Period of 10   PT Start Time 1435   PT Stop Time 1514   PT Time Calculation (min) 39 min   Activity Tolerance Patient tolerated treatment well   Behavior During Therapy [facility] for tasks assessed/performed      Past Medical History:  Diagnosis Date  . Abdominal aortic atherosclerosis ([place]) 09/2015   by xray  . Anxiety   . BCC (basal cell carcinoma of skin) 05/2011   on nose  . Cancer ([place])    skin cancer  . Chest pain    angina/chest pain  . DDD (degenerative disc disease), lumbosacral 12/06/1992  . Gallstones 09/2015   incidentally by xray  . GERD (gastroesophageal reflux disease) 05/1999  . History of shingles    ophthalmic, takes acyclovir daily  . Hyperlipidemia 12/2003  . Hypertension 1990  . Osteoarthritis 08/21/1989  . Osteoporosis with fracture 11/1997   with compression fracture T12  . Paroxysmal atrial flutter ([place]) 03/16/2012   on coumadin  . Rheumatoid arthritis ([place])   . RLS (restless legs syndrome)     Past Surgical History:  Procedure Laterality Date  . ABDOMINAL HYSTERECTOMY  Age 52 - 89   S/P TAH  . abdominal ultrasound  04/23/2004   Gallstones  . CATARACT EXTRACTION  05/2010   left eye  . CERVICAL DISCECTOMY  1992   Fusion (Dr. Joya Salm)  . ESOPHAGOGASTRODUODENOSCOPY  01/01/2002   with ulcer, bx. neg; + stricture gastric ulcer with hemorrhage  . ESOPHAGOGASTRODUODENOSCOPY  04/23/2004   Esophageal stricture -- dilated  . nuclear stress test  03/2012    no ischemia  . SKIN CANCER EXCISION  05/20/2011   BCC from nose  . US ECHOCARDIOGRAPHY  03/2012   in flutter, EF 60%, mild-mod aortic, mitral, tricuspid regurg, mildly dilated LA    There were no vitals filed for this visit.      Subjective Assessment - 11/16/15 1437    Subjective Back is about the same. "I think like I'm moving better." 1/10  back pain currently   Pertinent History Pt states she had pain in her R groin area which went away. Just has pain in her lower back. Had imaging which revealed a bulging disc. Back pain began about 5-6 weeks ago. Pt was packing up stuff and was dragging stuff downstairs in preparation to move. Pain got worse the next day (sometime in June 2017). Denies bowel or bladder problems or saddle anesthesia.     Patient Stated Goals "I just want it to quit hurting."   Currently in Pain? Yes   Pain Score 1    Pain Onset More than a month ago              Objectives   There-ex   Directed patient with SLS with opposite foot on 1st stair step with glute max squeeze 10x 5 seconds for 2 sets each LE  Forward wedding march 32 ft x 2 for glute med and max use  Reviewed and given as part of her HEP. Pt demonstrated and verbalized understanding.  Side stepping 32 ft to the L and 32 ft to the R for glute med muscle use Standing mini squats 10x with bilateral UE assist Seated hip adduction ball squeeze 10x2 with 5 second holds.    Improved exercise technique, movement at target joints, use of target muscles after mod verbal, visual, tactile cues.     Pt making steady progress towards decreased back pain and increase in function based on pt reports. Better able to move around in PT with less complain of back pain compared to previous sessions.                      PT Education - 11/16/15 2011    Education provided Yes   Education Details ther-ex   [employer]) Educated Patient   Methods Explanation;Demonstration;Tactile cues;Verbal cues    Comprehension Verbalized understanding;Returned demonstration             PT Long Term Goals - 11/14/15 2003      PT LONG TERM GOAL #1   Title Patient will have a decrease in back pain to 5/10 or less at worst to promote ability to perform chores, and walk with less pain.    Baseline 10/10 back pain at worst. 3-4/10 at most for the past 5 days (11/14/2015)   Time 4   Period Weeks   Status Achieved     PT LONG TERM GOAL #2   Title Patient will improve bilateral glute max strength by at least 1/2 MMT grade to promote ability to perform standing tasks with less back pain.    Time 4   Period Weeks   Status Achieved     PT LONG TERM GOAL #3   Title Patient will improve her Modified Oswestry Low Back Pain Disability Questionnaire score by at least 6 points as a demonstration of improved function.    Baseline 44%   Time 4   Period Weeks   Status On-going               Plan - 11/16/15 2012    Clinical Impression Statement Pt making steady progress towards decreased back pain and increase in function based on pt reports. Better able to move around in PT with less complain of back pain compared to previous sessions.    Rehab Potential Good   Clinical Impairments Affecting Rehab Potential pain, age   PT Frequency 2x / week   PT Duration 3 weeks   PT Treatment/Interventions Therapeutic exercise;Therapeutic activities;Manual techniques;Aquatic Therapy;Electrical Stimulation;Patient/family education;Neuromuscular re-education   PT Next Visit Plan Mobility, hip strengthening, posture   Consulted and Agree with Plan of Care Patient      Patient will benefit from skilled therapeutic intervention in order to improve the following deficits and impairments:  Pain, Abnormal gait, Decreased strength, Improper body mechanics, Difficulty walking, Postural dysfunction  Visit Diagnosis: Midline low back pain, with sciatica presence unspecified  Muscle weakness  (generalized)  Difficulty in walking, not elsewhere classified     Problem List Patient Active Problem List   Diagnosis Date Noted  . Abdominal aortic atherosclerosis ([place]) 09/07/2015  . Insomnia 06/08/2015  . Health maintenance examination 12/08/2014  . Advanced care planning/counseling discussion 12/08/2014  . Chronic leg pain 10/13/2014  . Renal insufficiency 10/30/2013  . Fatigue 10/28/2013  . Encounter for therapeutic drug monitoring 09/01/2013  . Persistent atrial fibrillation ([place]) 04/24/2013  . Medicare annual wellness visit, subsequent  04/14/2012  . Paroxysmal atrial flutter ([place]) 03/16/2012  . Atypical chest pain 03/16/2012  . RLS (restless legs syndrome) 02/07/2012  . Lower back pain 12/18/2009  . MDD (major depressive disorder), recurrent episode ([place]) 12/10/2007  . POSTHERPETIC NEURALGIA 04/20/2007  . HERPES ZOSTER 04/13/2007  . HLD (hyperlipidemia) 11/24/2006  . PLMD (periodic limb movement disorder) 11/24/2006  . Essential hypertension 11/24/2006  . GERD 11/24/2006  . GASTRIC ULCER 11/24/2006  . OSTEOARTHRITIS 11/24/2006  . Osteoporosis 11/24/2006    Joneen Boers PT, DPT   11/16/2015, 8:16 PM  [place] [facility] [address] [place], [address] Phone: [phone]   Fax:  [phone]  Name: [name] MRN: [id number] Date of Birth: 12-Jul-1929

## 2015-11-21 ENCOUNTER — Ambulatory Visit: Payer: Medicare Other | Admitting: Physical Therapy

## 2015-11-21 DIAGNOSIS — R262 Difficulty in walking, not elsewhere classified: Secondary | ICD-10-CM | POA: Diagnosis not present

## 2015-11-21 DIAGNOSIS — M545 Low back pain: Secondary | ICD-10-CM | POA: Diagnosis not present

## 2015-11-21 DIAGNOSIS — M6281 Muscle weakness (generalized): Secondary | ICD-10-CM | POA: Diagnosis not present

## 2015-11-21 NOTE — Therapy (Signed)
[facility] [address] [place], [address] Phone: [phone]   Fax:  [phone]  Physical Therapy Treatment  Patient Details  Name: [name] MRN: [id number] Date of Birth: 01-19-1930 Referring Provider: Ria Bush, MD  Encounter Date: 11/21/2015      PT End of Session - 11/21/15 0916    Visit Number 8   Number of Visits 15   Date for PT Re-Evaluation 12/07/15   Authorization Type 8   Authorization Time Period of 10   PT Start Time 0900   PT Stop Time 0945   PT Time Calculation (min) 45 min   Activity Tolerance Patient tolerated treatment well   Behavior During Therapy [facility] for tasks assessed/performed      Past Medical History:  Diagnosis Date  . Abdominal aortic atherosclerosis ([place]) 09/2015   by xray  . Anxiety   . BCC (basal cell carcinoma of skin) 05/2011   on nose  . Cancer ([place])    skin cancer  . Chest pain    angina/chest pain  . DDD (degenerative disc disease), lumbosacral 12/06/1992  . Gallstones 09/2015   incidentally by xray  . GERD (gastroesophageal reflux disease) 05/1999  . History of shingles    ophthalmic, takes acyclovir daily  . Hyperlipidemia 12/2003  . Hypertension 1990  . Osteoarthritis 08/21/1989  . Osteoporosis with fracture 11/1997   with compression fracture T12  . Paroxysmal atrial flutter ([place]) 03/16/2012   on coumadin  . Rheumatoid arthritis ([place])   . RLS (restless legs syndrome)     Past Surgical History:  Procedure Laterality Date  . ABDOMINAL HYSTERECTOMY  Age 61 - 32   S/P TAH  . abdominal ultrasound  04/23/2004   Gallstones  . CATARACT EXTRACTION  05/2010   left eye  . CERVICAL DISCECTOMY  1992   Fusion (Dr. Joya Salm)  . ESOPHAGOGASTRODUODENOSCOPY  01/01/2002   with ulcer, bx. neg; + stricture gastric ulcer with hemorrhage  . ESOPHAGOGASTRODUODENOSCOPY  04/23/2004   Esophageal stricture -- dilated  . nuclear stress test  03/2012   no ischemia  . SKIN CANCER EXCISION  05/20/2011   BCC from nose  . US ECHOCARDIOGRAPHY  03/2012   in flutter, EF 60%, mild-mod aortic, mitral, tricuspid regurg, mildly dilated LA    There were no vitals filed for this visit.      Subjective Assessment - 11/21/15 0905    Subjective Pt reports she is about the same. She still has LBP. Pt feels like her exercises aren't helping.   Pertinent History Pt states she had pain in her R groin area which went away. Just has pain in her lower back. Had imaging which revealed a bulging disc. Back pain began about 5-6 weeks ago. Pt was packing up stuff and was dragging stuff downstairs in preparation to move. Pain got worse the next day (sometime in June 2017). Denies bowel or bladder problems or saddle anesthesia.     Patient Stated Goals "I just want it to quit hurting."   Currently in Pain? Yes   Pain Score 2   midline LBP   Pain Type Acute pain   Pain Onset More than a month ago      There-ex   SKTC 2x20 to facilitate foramina opening, no change in pain  Watched pt walk, L trunk lean Standing R sidebend 2x10 for L trunk stretch, no pain afterwards in static standing position Instructed pt not to push into pain  and only a gentle stretch. Hooklying: TA contraction 10x10 sec hold  With march x10 each  With hip fall out 2x10    Mod cues for proper technique of exercises to target specific muscles and slow eccentric contractions for most effective strengthening.                           PT Education - 11/21/15 0931    Education provided Yes   Education Details log roll technique   Person(s) Educated Patient   Methods Explanation   Comprehension Verbalized understanding;Returned demonstration             PT Long Term Goals - 11/14/15 2003      PT LONG TERM GOAL #1   Title Patient will have a decrease in back pain to 5/10 or less at worst to promote ability to perform chores, and walk with less pain.     Baseline 10/10 back pain at worst. 3-4/10 at most for the past 5 days (11/14/2015)   Time 4   Period Weeks   Status Achieved     PT LONG TERM GOAL #2   Title Patient will improve bilateral glute max strength by at least 1/2 MMT grade to promote ability to perform standing tasks with less back pain.    Time 4   Period Weeks   Status Achieved     PT LONG TERM GOAL #3   Title Patient will improve her Modified Oswestry Low Back Pain Disability Questionnaire score by at least 6 points as a demonstration of improved function.    Baseline 44%   Time 4   Period Weeks   Status On-going               Plan - 11/21/15 0932    Clinical Impression Statement Pt demonstrated hypomobility in lumbar flexion and R sidebending. After stretching, pt reported 0/10 resting pain. Pt was able to perform core strengtheinng exercises, but required frequent cues for proper technique. She will benefit from continued stretching and core strengthening to progress towards functional goals. Pt seemed to respond well to foramina opening exercises this session.   Rehab Potential Good   Clinical Impairments Affecting Rehab Potential pain, age   PT Frequency 2x / week   PT Duration 3 weeks   PT Treatment/Interventions Therapeutic exercise;Therapeutic activities;Manual techniques;Aquatic Therapy;Electrical Stimulation;Patient/family education;Neuromuscular re-education   PT Next Visit Plan Mobility, hip strengthening, posture   Consulted and Agree with Plan of Care Patient      Patient will benefit from skilled therapeutic intervention in order to improve the following deficits and impairments:  Pain, Abnormal gait, Decreased strength, Improper body mechanics, Difficulty walking, Postural dysfunction  Visit Diagnosis: Midline low back pain, with sciatica presence unspecified  Muscle weakness (generalized)  Difficulty in walking, not elsewhere classified     Problem List Patient Active Problem List    Diagnosis Date Noted  . Abdominal aortic atherosclerosis ([place]) 09/07/2015  . Insomnia 06/08/2015  . Health maintenance examination 12/08/2014  . Advanced care planning/counseling discussion 12/08/2014  . Chronic leg pain 10/13/2014  . Renal insufficiency 10/30/2013  . Fatigue 10/28/2013  . Encounter for therapeutic drug monitoring 09/01/2013  . Persistent atrial fibrillation ([place]) 04/24/2013  . Medicare annual wellness visit, subsequent 04/14/2012  . Paroxysmal atrial flutter ([place]) 03/16/2012  . Atypical chest pain 03/16/2012  . RLS (restless legs syndrome) 02/07/2012  . Lower back pain 12/18/2009  . MDD (major depressive disorder), recurrent episode ([place])  12/10/2007  . POSTHERPETIC NEURALGIA 04/20/2007  . HERPES ZOSTER 04/13/2007  . HLD (hyperlipidemia) 11/24/2006  . PLMD (periodic limb movement disorder) 11/24/2006  . Essential hypertension 11/24/2006  . GERD 11/24/2006  . GASTRIC ULCER 11/24/2006  . OSTEOARTHRITIS 11/24/2006  . Osteoporosis 11/24/2006    Neoma Laming, PT, DPT  11/21/15, 9:57 AM [facility] [address] [place], [address] Phone: [phone]   Fax:  [phone]  Name: [name] MRN: [id number] Date of Birth: 12-Jul-1929

## 2015-11-23 ENCOUNTER — Ambulatory Visit: Payer: Medicare Other

## 2015-11-28 ENCOUNTER — Ambulatory Visit: Payer: Medicare Other

## 2015-11-28 DIAGNOSIS — R262 Difficulty in walking, not elsewhere classified: Secondary | ICD-10-CM | POA: Diagnosis not present

## 2015-11-28 DIAGNOSIS — M6281 Muscle weakness (generalized): Secondary | ICD-10-CM | POA: Diagnosis not present

## 2015-11-28 DIAGNOSIS — M545 Low back pain: Secondary | ICD-10-CM | POA: Diagnosis not present

## 2015-11-28 NOTE — Therapy (Signed)
[facility] [address] [place], [address] Phone: [phone]   Fax:  [phone]  Physical Therapy Treatment  Patient Details  Name: [name] MRN: [id number] Date of Birth: [birth year] Referring Provider: Ria Bush, MD  Encounter Date: 11/28/2015      PT End of Session - 11/28/15 1035    Visit Number 9   Number of Visits 15   Date for PT Re-Evaluation 12/07/15   Authorization Type 9   Authorization Time Period of 10   PT Start Time 1035   PT Stop Time 1125   PT Time Calculation (min) 50 min   Activity Tolerance Patient tolerated treatment well   Behavior During Therapy [facility] for tasks assessed/performed      Past Medical History:  Diagnosis Date  . Abdominal aortic atherosclerosis ([place]) 09/2015   by xray  . Anxiety   . BCC (basal cell carcinoma of skin) 05/2011   on nose  . Cancer ([place])    skin cancer  . Chest pain    angina/chest pain  . DDD (degenerative disc disease), lumbosacral 12/06/1992  . Gallstones 09/2015   incidentally by xray  . GERD (gastroesophageal reflux disease) 05/1999  . History of shingles    ophthalmic, takes acyclovir daily  . Hyperlipidemia 12/2003  . Hypertension 1990  . Osteoarthritis 08/21/1989  . Osteoporosis with fracture 11/1997   with compression fracture T12  . Paroxysmal atrial flutter ([place]) 03/16/2012   on coumadin  . Rheumatoid arthritis ([place])   . RLS (restless legs syndrome)     Past Surgical History:  Procedure Laterality Date  . ABDOMINAL HYSTERECTOMY  Age 82 - 46   S/P TAH  . abdominal ultrasound  04/23/2004   Gallstones  . CATARACT EXTRACTION  05/2010   left eye  . CERVICAL DISCECTOMY  1992   Fusion (Dr. Joya Salm)  . ESOPHAGOGASTRODUODENOSCOPY  01/01/2002   with ulcer, bx. neg; + stricture gastric ulcer with hemorrhage  . ESOPHAGOGASTRODUODENOSCOPY  04/23/2004   Esophageal stricture -- dilated  . nuclear stress test  03/2012    no ischemia  . SKIN CANCER EXCISION  05/20/2011   BCC from nose  . US ECHOCARDIOGRAPHY  03/2012   in flutter, EF 60%, mild-mod aortic, mitral, tricuspid regurg, mildly dilated LA    There were no vitals filed for this visit.      Subjective Assessment - 11/28/15 1037    Subjective The back is still aggravating. Drove to [place] this past weekend. A family member died (daughter's mother in [place]). The back did ok during the drive. Bought a back brace which helps.  4-5/10 currently. Not bad yesterday. Standing and cleaning the dishes is not bad. Fixing her bed bothers her back. It comes and goes. Sometimes it seems like its gone, other times it hurts.    Pertinent History Pt states she had pain in her R groin area which went away. Just has pain in her lower back. Had imaging which revealed a bulging disc. Back pain began about 5-6 weeks ago. Pt was packing up stuff and was dragging stuff downstairs in preparation to move. Pain got worse the next day (sometime in June 2017). Denies bowel or bladder problems or saddle anesthesia.     Patient Stated Goals "I just want it to quit hurting."   Currently in Pain? Yes   Pain Score 5   4-5/10   Pain Onset More than a month ago  Objectives    There-ex  Directed patient with standing R side bend stretch 10x 5 seconds  SLS on with opposite foot on first step with glute max squeeze 10x 5 seconds for 2 sets for each side to promote pelvic control during stance phase of gait  Reviewed and given as part of her HEP. Pt demonstrated and verbalized understanding. Pt states more difficulty squeezing L rear end muscle with L foot is on 1st step compared to R side.  Seated glute max squeeze 10x 5 seconds   Forward step up onto 1st regular step with L LE 5x with bilateral UE assist. (secondary to L anterior pelvic tilt observed). Cues for neutral back, and no R lateral lean when stepping up or down. 3x5 with emphasis on  glute max muscle use.   Seated L hip extension isometrics 5x5 seconds for 2 sets  Sit <> stand with glute max and abdominal contraction 10x. No back pain.  Reviewed and given as part of her HEP. Pt demonstrated and verbalized understanding.    Improved exercise technique, movement at target joints, use of target muscles after mod verbal, visual, tactile cues.      Pt demonstrates some improvement overall but inconsistent. Some days, back is not bothering her as much. Other days, it gives her a hard time. Less back pain with sit <> stand with abdominal and glute contraction/use.            PT Education - 11/28/15 1049    Education provided Yes   Education Details ther-ex, HEP   Person(s) Educated Patient   Methods Explanation;Demonstration;Tactile cues;Verbal cues;Handout   Comprehension Returned demonstration;Verbalized understanding             PT Long Term Goals - 11/14/15 2003      PT LONG TERM GOAL #1   Title Patient will have a decrease in back pain to 5/10 or less at worst to promote ability to perform chores, and walk with less pain.    Baseline 10/10 back pain at worst. 3-4/10 at most for the past 5 days (11/14/2015)   Time 4   Period Weeks   Status Achieved     PT LONG TERM GOAL #2   Title Patient will improve bilateral glute max strength by at least 1/2 MMT grade to promote ability to perform standing tasks with less back pain.    Time 4   Period Weeks   Status Achieved     PT LONG TERM GOAL #3   Title Patient will improve her Modified Oswestry Low Back Pain Disability Questionnaire score by at least 6 points as a demonstration of improved function.    Baseline 44%   Time 4   Period Weeks   Status On-going               Plan - 11/28/15 1055    Clinical Impression Statement Pt demonstrates some improvement overall but inconsistent. Some days, back is not bothering her as much. Other days, it gives her a hard time. Less back pain with sit <>  stand with abdominal and glute contraction/use.    Rehab Potential Good   Clinical Impairments Affecting Rehab Potential pain, age   PT Frequency 2x / week   PT Duration 3 weeks   PT Treatment/Interventions Therapeutic exercise;Therapeutic activities;Manual techniques;Aquatic Therapy;Electrical Stimulation;Patient/family education;Neuromuscular re-education   PT Next Visit Plan Mobility, hip strengthening, posture   Consulted and Agree with Plan of Care Patient      Patient will benefit  from skilled therapeutic intervention in order to improve the following deficits and impairments:  Pain, Abnormal gait, Decreased strength, Improper body mechanics, Difficulty walking, Postural dysfunction  Visit Diagnosis: Midline low back pain, with sciatica presence unspecified  Muscle weakness (generalized)  Difficulty in walking, not elsewhere classified     Problem List Patient Active Problem List   Diagnosis Date Noted  . Abdominal aortic atherosclerosis ([place]) 09/07/2015  . Insomnia 06/08/2015  . Health maintenance examination 12/08/2014  . Advanced care planning/counseling discussion 12/08/2014  . Chronic leg pain 10/13/2014  . Renal insufficiency 10/30/2013  . Fatigue 10/28/2013  . Encounter for therapeutic drug monitoring 09/01/2013  . Persistent atrial fibrillation ([place]) 04/24/2013  . Medicare annual wellness visit, subsequent 04/14/2012  . Paroxysmal atrial flutter ([place]) 03/16/2012  . Atypical chest pain 03/16/2012  . RLS (restless legs syndrome) 02/07/2012  . Lower back pain 12/18/2009  . MDD (major depressive disorder), recurrent episode ([place]) 12/10/2007  . POSTHERPETIC NEURALGIA 04/20/2007  . HERPES ZOSTER 04/13/2007  . HLD (hyperlipidemia) 11/24/2006  . PLMD (periodic limb movement disorder) 11/24/2006  . Essential hypertension 11/24/2006  . GERD 11/24/2006  . GASTRIC ULCER 11/24/2006  . OSTEOARTHRITIS 11/24/2006  . Osteoporosis 11/24/2006    Joneen Boers PT, DPT    11/28/2015, 12:31 PM  [facility] [address] [place], [address] Phone: [phone]   Fax:  [phone]  Name: CHARMAGNE FJELD MRN: [id number] Date of Birth: [birth year]

## 2015-11-28 NOTE — Patient Instructions (Addendum)
     Standing holding onto something sturdy for support:   Place one foot onto the 1st step.   Squeeze your rear end muscles together.    Hold for 5 seconds.    Repeat 10 times.   Perform 3 sets daily.      Repeat for your other side.       Tighten up your tummy muscles, and rear end muscles as you stand up and sit down from a chair.

## 2015-11-30 ENCOUNTER — Ambulatory Visit: Payer: Medicare Other

## 2015-12-04 ENCOUNTER — Ambulatory Visit (INDEPENDENT_AMBULATORY_CARE_PROVIDER_SITE_OTHER): Payer: Medicare Other | Admitting: Family Medicine

## 2015-12-04 ENCOUNTER — Encounter: Payer: Self-pay | Admitting: Family Medicine

## 2015-12-04 VITALS — BP 130/80 | HR 72 | Temp 98.0°F | Wt 150.5 lb

## 2015-12-04 DIAGNOSIS — M545 Low back pain, unspecified: Secondary | ICD-10-CM

## 2015-12-04 DIAGNOSIS — Z23 Encounter for immunization: Secondary | ICD-10-CM | POA: Diagnosis not present

## 2015-12-04 MED ORDER — METOPROLOL TARTRATE 25 MG PO TABS: 25.0000 mg | ORAL_TABLET | Freq: Two times a day (BID) | ORAL | 1 refills | Status: DC

## 2015-12-04 NOTE — Progress Notes (Signed)
Pre visit review using our clinic review tool, if applicable. No additional management support is needed unless otherwise documented below in the visit note. 

## 2015-12-04 NOTE — Assessment & Plan Note (Signed)
Ongoing lower lumbar spine pain with tenderness to palpation, ongoing over last 3 months. Not improved with conservative management (tylenol, heat, PT). Xray showing DDD predominantly at L4/5. Will check MR to evaluate for procedural intervention.

## 2015-12-04 NOTE — Addendum Note (Signed)
Addended by: Royann Shivers A on: 12/04/2015 03:41 PM   Modules accepted: Orders

## 2015-12-04 NOTE — Progress Notes (Signed)
BP 130/80   Pulse 72   Temp 98 F (36.7 C) (Oral)   Wt 150 lb 8 oz (68.3 kg)   BMI 25.83 kg/m    CC: back pain Subjective:    Patient ID: [name], female    DOB: 06/01/1929, 82 y.o.   MRN: [id number]  HPI: MCKAELA PHILIPP is a 82 y.o. female presenting on 12/04/2015 for Back Pain (doesn't feel that PT is helping)   See prior note for details. Ongoing lumbar back pain since May 2017, worse in am and at night. Activity limiting. Not as bad when ambulating on her feet or when sitting in recliner. At that time xray showed moderate DDD centered at L4/5 with chronic partial T12 compression, referred to PT and recommended tylenol 1000mg  BID x1 wk. She has completed 9 PT sessions without significant improvement. She has started using lumbar brace which has helped more than PT.   Denies shooting pain down legs, numbness/weakness of legs, fevers or bowel/bladder incontinence.   May have started after pulling box down stairs.   Relevant past medical, surgical, family and social history reviewed and updated as indicated. Interim medical history since our last visit reviewed. Allergies and medications reviewed and updated. Current Outpatient Prescriptions on File Prior to Visit  Medication Sig  . acetaminophen (TYLENOL) 500 MG tablet Take 500 mg by mouth every 6 (six) hours as needed for moderate pain.   Marland Kitchen acyclovir (ZOVIRAX) 400 MG tablet Take 400 mg by mouth daily.    Marland Kitchen apixaban (ELIQUIS) 5 MG TABS tablet Take 1 tablet (5 mg total) by mouth 2 (two) times daily.  Marland Kitchen atorvastatin (LIPITOR) 20 MG tablet Take 1 tablet (20 mg total) by mouth daily.  . calcium-vitamin D (OSCAL WITH D) 500-200 MG-UNIT per tablet Take 1 tablet by mouth daily.    Marland Kitchen diltiazem (CARDIZEM CD) 120 MG 24 hr capsule Take 1 capsule (120 mg total) by mouth every evening.  . ferrous sulfate 325 (65 FE) MG tablet Take 1 tablet (325 mg total) by mouth daily with breakfast.  . hydrochlorothiazide (MICROZIDE) 12.5 MG capsule  Take 1 capsule (12.5 mg total) by mouth daily.  . pantoprazole (PROTONIX) 40 MG tablet Take 40 mg by mouth daily.  . prednisoLONE acetate (PRED FORTE) 1 % ophthalmic suspension Place 1 drop into the left eye daily.   Marland Kitchen rOPINIRole (REQUIP) 1 MG tablet Take 2 tablets (2 mg total) by mouth at bedtime.  Marland Kitchen venlafaxine (EFFEXOR) 37.5 MG tablet Take 1 tablet (37.5 mg total) by mouth daily.  . vitamin B-12 (CYANOCOBALAMIN) 1000 MCG tablet Take 1,000 mcg by mouth daily.  Marland Kitchen gabapentin (NEURONTIN) 100 MG capsule Take 1 capsule (100 mg total) by mouth 2 (two) times daily. (Patient not taking: Reported on 12/04/2015)   No current facility-administered medications on file prior to visit.     Review of Systems Per HPI unless specifically indicated in ROS section     Objective:    BP 130/80   Pulse 72   Temp 98 F (36.7 C) (Oral)   Wt 150 lb 8 oz (68.3 kg)   BMI 25.83 kg/m   Wt Readings from Last 3 Encounters:  12/04/15 150 lb 8 oz (68.3 kg)  09/11/15 150 lb 12.8 oz (68.4 kg)  08/29/15 148 lb 8 oz (67.4 kg)    Physical Exam  Constitutional: She appears well-developed and well-nourished.  Musculoskeletal: She exhibits no edema.  + pain midline lowerl umbar spine Minimal lumbar paraspinous mm tenderness  Neg SLR bilaterally. No pain with int/ext rotation at hip. + FABER on right. No pain at SIJ, GTB or sciatic notch bilaterally.   Skin: Skin is warm and dry. No rash noted.  Psychiatric: She has a normal mood and affect.  Nursing note and vitals reviewed.   LUMBAR SPINE - COMPLETE 4+ VIEW COMPARISON:  No previous studies in of the lumbar spine in PACs. FINDINGS: The lumbar vertebral bodies are preserved in height. There is chronic superior compression of the body of T12 unchanged since a chest x-ray of Sep 02, 2013. The lumbar disc space heights are mildly narrowed at L3-4, L4-5, and L5-S1 with L4-5 being most significantly involved. There is facet joint hypertrophy at L5-S1. There is  no spondylolisthesis. There is gentle curvature of the thoracolumbar spine with the convexity toward the left. The pedicles and transverse processes are intact. There are multiple calcified gallstones with the largest measuring as much as 2.1 cm in diameter. There are splenic calcifications. There is dense calcification in the wall of the abdominal aorta without evidence of aneurysm. IMPRESSION: Mild to moderate degenerative disc space narrowing centered at L4-5 with involvement of the L3-4 and L5-S1. There is no lumbar compression fracture. There is chronic partial compression of the body of T12. Gallstones. Electronically Signed   By: David  Martinique M.D.   On: 09/11/2015 11:36    Assessment & Plan:   Problem List Items Addressed This Visit    Lower back pain - Primary    Ongoing lower lumbar spine pain with tenderness to palpation, ongoing over last 3 months. Not improved with conservative management (tylenol, heat, PT). Xray showing DDD predominantly at L4/5. Will check MR to evaluate for procedural intervention.       Relevant Orders   MR Lumbar Spine Wo Contrast    Other Visit Diagnoses   None.      Follow up plan: Return if symptoms worsen or fail to improve.  Ria Bush, MD

## 2015-12-04 NOTE — Patient Instructions (Addendum)
Flu shot today. Call to schedule follow up with Dr Fletcher Anon as you're due. We will order MRI of lower back and may refer you to back doctor pending results.  In meantime, try daily tylenol 500mg  with dinner, heating pad to lower back.

## 2015-12-05 ENCOUNTER — Ambulatory Visit: Payer: Medicare Other

## 2015-12-07 ENCOUNTER — Ambulatory Visit: Payer: Medicare Other

## 2015-12-08 ENCOUNTER — Ambulatory Visit
Admission: RE | Admit: 2015-12-08 | Discharge: 2015-12-08 | Disposition: A | Discharge status: Home / Self Care | Payer: Medicare Other | Source: Ambulatory Visit | Attending: Family Medicine | Admitting: Family Medicine

## 2015-12-08 DIAGNOSIS — M545 Low back pain, unspecified: Secondary | ICD-10-CM

## 2015-12-08 DIAGNOSIS — M4806 Spinal stenosis, lumbar region: Secondary | ICD-10-CM | POA: Diagnosis not present

## 2015-12-10 ENCOUNTER — Other Ambulatory Visit: Payer: Self-pay | Admitting: Family Medicine

## 2015-12-10 DIAGNOSIS — M5136 Other intervertebral disc degeneration, lumbar region: Secondary | ICD-10-CM

## 2015-12-14 ENCOUNTER — Ambulatory Visit (INDEPENDENT_AMBULATORY_CARE_PROVIDER_SITE_OTHER): Payer: Medicare Other | Admitting: Cardiovascular Disease

## 2015-12-14 ENCOUNTER — Encounter: Payer: Self-pay | Admitting: Cardiovascular Disease

## 2015-12-14 VITALS — BP 140/80 | HR 65 | Ht 64.0 in | Wt 151.5 lb

## 2015-12-14 DIAGNOSIS — I4891 Unspecified atrial fibrillation: Secondary | ICD-10-CM

## 2015-12-14 DIAGNOSIS — E785 Hyperlipidemia, unspecified: Secondary | ICD-10-CM | POA: Diagnosis not present

## 2015-12-14 DIAGNOSIS — I480 Paroxysmal atrial fibrillation: Secondary | ICD-10-CM | POA: Diagnosis not present

## 2015-12-14 DIAGNOSIS — I10 Essential (primary) hypertension: Secondary | ICD-10-CM | POA: Diagnosis not present

## 2015-12-14 NOTE — Progress Notes (Signed)
Cardiology Office Note   Date:  12/14/2015   ID:  [name], DOB Dec 28, 1929, MRN [id number]  PCP:  Ria Bush, MD  Cardiologist:   [name], MD   Chief Complaint  Patient presents with  . Other    6 month f/u c/o sob and fatigue. Meds reviewed verbally with pt.      History of Present Illness: [name] is a 82 y.o. female who presents for A follow-up visit regarding paroxysmal atrial fibrillation. Nuclear stress test in 03/2012 showed no evidence of ischemia. Echocardiogram showed normal LV systolic function, mild to moderate aortic and mitral regurgitation, moderate tricuspid regurgitation with mild pulmonary hypertension.  During last visit, she was switched from warfarin to Eliquis. She has been doing well and denies any chest pain. She denies any palpitations or worsening dyspnea. No reported side effects with anticoagulation.   Past Medical History:  Diagnosis Date  . Abdominal aortic atherosclerosis ([place]) 09/2015   by xray  . Anxiety   . BCC (basal cell carcinoma of skin) 05/2011   on nose  . Cancer ([facility])    skin cancer  . Chest pain    angina/chest pain  . DDD (degenerative disc disease), lumbosacral 12/06/1992  . Gallstones 09/2015   incidentally by xray  . GERD (gastroesophageal reflux disease) 05/1999  . History of shingles    ophthalmic, takes acyclovir daily  . Hyperlipidemia 12/2003  . Hypertension 1990  . Osteoarthritis 08/21/1989  . Osteoporosis with fracture 11/1997   with compression fracture T12  . Paroxysmal atrial flutter ([place]) 03/16/2012   on coumadin  . Rheumatoid arthritis ([place])   . RLS (restless legs syndrome)     Past Surgical History:  Procedure Laterality Date  . ABDOMINAL HYSTERECTOMY  Age 34 - 61   S/P TAH  . abdominal ultrasound  04/23/2004   Gallstones  . CATARACT EXTRACTION  05/2010   left eye  . CERVICAL DISCECTOMY  1992   Fusion (Dr. Joya Salm)  . ESOPHAGOGASTRODUODENOSCOPY  01/01/2002   with ulcer,  bx. neg; + stricture gastric ulcer with hemorrhage  . ESOPHAGOGASTRODUODENOSCOPY  04/23/2004   Esophageal stricture -- dilated  . nuclear stress test  03/2012   no ischemia  . SKIN CANCER EXCISION  05/20/2011   BCC from nose  . US ECHOCARDIOGRAPHY  03/2012   in flutter, EF 60%, mild-mod aortic, mitral, tricuspid regurg, mildly dilated LA     Current Outpatient Prescriptions  Medication Sig Dispense Refill  . acetaminophen (TYLENOL) 500 MG tablet Take 500 mg by mouth every 6 (six) hours as needed for moderate pain.     Marland Kitchen acyclovir (ZOVIRAX) 400 MG tablet Take 400 mg by mouth daily.      Marland Kitchen apixaban (ELIQUIS) 5 MG TABS tablet Take 1 tablet (5 mg total) by mouth 2 (two) times daily. 60 tablet 6  . atorvastatin (LIPITOR) 20 MG tablet Take 1 tablet (20 mg total) by mouth daily. 30 tablet 0  . calcium-vitamin D (OSCAL WITH D) 500-200 MG-UNIT per tablet Take 1 tablet by mouth daily.      Marland Kitchen diltiazem (CARDIZEM CD) 120 MG 24 hr capsule Take 1 capsule (120 mg total) by mouth every evening. 90 capsule 3  . ferrous sulfate 325 (65 FE) MG tablet Take 1 tablet (325 mg total) by mouth daily with breakfast.    . hydrochlorothiazide (MICROZIDE) 12.5 MG capsule Take 1 capsule (12.5 mg total) by mouth daily. 30 capsule 11  . metoprolol tartrate (LOPRESSOR) 25  MG tablet Take 1 tablet (25 mg total) by mouth 2 (two) times daily. 60 tablet 1  . pantoprazole (PROTONIX) 40 MG tablet Take 40 mg by mouth daily.    . prednisoLONE acetate (PRED FORTE) 1 % ophthalmic suspension Place 1 drop into the left eye daily.     Marland Kitchen rOPINIRole (REQUIP) 1 MG tablet Take 2 tablets (2 mg total) by mouth at bedtime. 60 tablet 11  . venlafaxine (EFFEXOR) 37.5 MG tablet Take 1 tablet (37.5 mg total) by mouth daily. 30 tablet 6  . vitamin B-12 (CYANOCOBALAMIN) 1000 MCG tablet Take 1,000 mcg by mouth daily.     No current facility-administered medications for this visit.     Allergies:   Penicillins    Social History:  The patient   reports that she quit smoking about 39 years ago. She has a 0.25 pack-year smoking history. She has never used smokeless tobacco. She reports that she drinks about 1.2 oz of alcohol per week . She reports that she does not use drugs.   Family History:  The patient's family history includes Cancer in her brother; Diabetes in her brother, brother, brother, and sister; Emphysema in her mother; Heart disease in her brother and father; Hypertension in her brother; Stroke in her brother, father, and sister.    ROS:  Please see the history of present illness.   Otherwise, review of systems are positive for none.   All other systems are reviewed and negative.    PHYSICAL EXAM: VS:  BP 140/80 (BP Location: Left Arm, Patient Position: Sitting, Cuff Size: Normal)   Pulse 65   Ht 5\' 4"  (1.626 m)   Wt 151 lb 8 oz (68.7 kg)   BMI 26.00 kg/m  , BMI Body mass index is 26 kg/m. GEN: Well nourished, well developed, in no acute distress  HEENT: normal  Neck: no JVD, carotid bruits, or masses Cardiac: RRR; no murmurs, rubs, or gallops,no edema  Respiratory:  clear to auscultation bilaterally, normal work of breathing GI: soft, nontender, nondistended, + BS MS: no deformity or atrophy  Skin: warm and dry, no rash Neuro:  Strength and sensation are intact Psych: euthymic mood, full affect   EKG:  EKG is ordered today. The ekg ordered today demonstrates  normal sinus rhythm with nonspecific ST changes.   Recent Labs: 04/24/2015: BUN 13; Creatinine, Ser 0.96; Platelets 256; Potassium 4.0; Sodium 144    Lipid Panel    Component Value Date/Time   CHOL 160 12/02/2014 0846   TRIG 142.0 12/02/2014 0846   HDL 57.00 12/02/2014 0846   CHOLHDL 3 12/02/2014 0846   VLDL 28.4 12/02/2014 0846   LDLCALC 75 12/02/2014 0846   LDLDIRECT 173.1 02/19/2010 0908      Wt Readings from Last 3 Encounters:  12/14/15 151 lb 8 oz (68.7 kg)  12/04/15 150 lb 8 oz (68.3 kg)  09/11/15 150 lb 12.8 oz (68.4 kg)        No flowsheet data found.    ASSESSMENT AND PLAN:  1.   Paroxysmal atrial fibrillation: She is noted to be in sinus rhythm today. Continue treatment with diltiazem and metoprolol. I might consider considilating to 1 medication in the future. She is tolerating anticoagulation with Eliquis.  She is known to have normal renal function.    2. Essential hypertension: Blood pressure is controlled on current medications.  3. Hyperlipidemia: Currently on atorvastatin 20 mg once daily with most recent LDL of 75.    Disposition:   FU  with me in 6 months  Signed,  [name], MD  12/14/2015 8:45 AM    [place]

## 2015-12-14 NOTE — Patient Instructions (Signed)
Medication Instructions: Continue same medications.   Labwork: None.   Procedures/Testing: None.   Follow-Up: 6 months with Dr. Arida.   Any Additional Special Instructions Will Be Listed Below (If Applicable).     If you need a refill on your cardiac medications before your next appointment, please call your pharmacy.   

## 2015-12-25 ENCOUNTER — Telehealth: Payer: Self-pay | Admitting: Cardiovascular Disease

## 2015-12-25 NOTE — Telephone Encounter (Signed)
Per [facility], 90 day cash price $35 for cardizem. Informed pt who verbalized understanding and is appreciative of the information. States this is affordable.

## 2015-12-25 NOTE — Telephone Encounter (Signed)
Pt calling stating that her Diltiazem is too expensive, she would like an alternative. Please call.

## 2016-01-07 HISTORY — PX: OTHER SURGICAL HISTORY: SHX169

## 2016-01-09 ENCOUNTER — Other Ambulatory Visit: Payer: Self-pay | Admitting: Family Medicine

## 2016-01-09 DIAGNOSIS — B0233 Zoster keratitis: Secondary | ICD-10-CM | POA: Diagnosis not present

## 2016-01-09 NOTE — Telephone Encounter (Signed)
Ok to refill? Last filled 01/06/15 #[id number]

## 2016-01-10 ENCOUNTER — Other Ambulatory Visit: Payer: Self-pay | Admitting: *Deleted

## 2016-01-10 DIAGNOSIS — I4892 Unspecified atrial flutter: Secondary | ICD-10-CM

## 2016-01-10 DIAGNOSIS — M5416 Radiculopathy, lumbar region: Secondary | ICD-10-CM | POA: Diagnosis not present

## 2016-01-10 DIAGNOSIS — M5136 Other intervertebral disc degeneration, lumbar region: Secondary | ICD-10-CM | POA: Diagnosis not present

## 2016-01-10 DIAGNOSIS — M48062 Spinal stenosis, lumbar region with neurogenic claudication: Secondary | ICD-10-CM | POA: Diagnosis not present

## 2016-01-10 MED ORDER — APIXABAN 5 MG PO TABS: 5.0000 mg | ORAL_TABLET | Freq: Two times a day (BID) | ORAL | 6 refills | Status: DC

## 2016-01-10 NOTE — Telephone Encounter (Signed)
Requested Prescriptions   Signed Prescriptions Disp Refills  . apixaban (ELIQUIS) 5 MG TABS tablet 60 tablet 6    Sig: Take 1 tablet (5 mg total) by mouth 2 (two) times daily.    Authorizing Provider: [name] A    Ordering User: Britt Bottom

## 2016-02-01 DIAGNOSIS — M5416 Radiculopathy, lumbar region: Secondary | ICD-10-CM | POA: Diagnosis not present

## 2016-02-01 DIAGNOSIS — M48062 Spinal stenosis, lumbar region with neurogenic claudication: Secondary | ICD-10-CM | POA: Diagnosis not present

## 2016-02-01 DIAGNOSIS — M5136 Other intervertebral disc degeneration, lumbar region: Secondary | ICD-10-CM | POA: Diagnosis not present

## 2016-02-05 ENCOUNTER — Encounter: Payer: Self-pay | Admitting: Family Medicine

## 2016-02-12 ENCOUNTER — Other Ambulatory Visit: Payer: Self-pay | Admitting: Family Medicine

## 2016-02-15 DIAGNOSIS — M5416 Radiculopathy, lumbar region: Secondary | ICD-10-CM | POA: Diagnosis not present

## 2016-02-15 DIAGNOSIS — M48062 Spinal stenosis, lumbar region with neurogenic claudication: Secondary | ICD-10-CM | POA: Diagnosis not present

## 2016-02-15 DIAGNOSIS — M5136 Other intervertebral disc degeneration, lumbar region: Secondary | ICD-10-CM | POA: Diagnosis not present

## 2016-02-25 ENCOUNTER — Encounter: Payer: Self-pay | Admitting: Family Medicine

## 2016-03-19 DIAGNOSIS — M48062 Spinal stenosis, lumbar region with neurogenic claudication: Secondary | ICD-10-CM | POA: Diagnosis not present

## 2016-03-19 DIAGNOSIS — M5416 Radiculopathy, lumbar region: Secondary | ICD-10-CM | POA: Diagnosis not present

## 2016-03-19 DIAGNOSIS — M5136 Other intervertebral disc degeneration, lumbar region: Secondary | ICD-10-CM | POA: Diagnosis not present

## 2016-03-29 ENCOUNTER — Ambulatory Visit: Payer: Medicare Other

## 2016-04-05 ENCOUNTER — Other Ambulatory Visit: Payer: Self-pay | Admitting: Family Medicine

## 2016-04-05 ENCOUNTER — Ambulatory Visit: Payer: Medicare Other

## 2016-04-05 DIAGNOSIS — I4819 Other persistent atrial fibrillation: Secondary | ICD-10-CM

## 2016-04-05 DIAGNOSIS — M810 Age-related osteoporosis without current pathological fracture: Secondary | ICD-10-CM

## 2016-04-05 DIAGNOSIS — E785 Hyperlipidemia, unspecified: Secondary | ICD-10-CM

## 2016-04-08 HISTORY — PX: BREAST EXCISIONAL BIOPSY: SUR124

## 2016-04-13 ENCOUNTER — Other Ambulatory Visit: Payer: Self-pay | Admitting: Family Medicine

## 2016-04-15 NOTE — Telephone Encounter (Signed)
Last refill 01/09/16 #30+3, ok to refill?

## 2016-04-18 ENCOUNTER — Encounter: Payer: Self-pay | Admitting: Family Medicine

## 2016-04-18 DIAGNOSIS — M5416 Radiculopathy, lumbar region: Secondary | ICD-10-CM | POA: Diagnosis not present

## 2016-04-18 DIAGNOSIS — M5136 Other intervertebral disc degeneration, lumbar region: Secondary | ICD-10-CM | POA: Diagnosis not present

## 2016-04-18 DIAGNOSIS — M48062 Spinal stenosis, lumbar region with neurogenic claudication: Secondary | ICD-10-CM | POA: Diagnosis not present

## 2016-05-14 ENCOUNTER — Other Ambulatory Visit: Payer: Self-pay | Admitting: Family Medicine

## 2016-05-28 ENCOUNTER — Ambulatory Visit (INDEPENDENT_AMBULATORY_CARE_PROVIDER_SITE_OTHER): Payer: Medicare Other

## 2016-05-28 ENCOUNTER — Encounter (INDEPENDENT_AMBULATORY_CARE_PROVIDER_SITE_OTHER): Payer: Self-pay

## 2016-05-28 ENCOUNTER — Ambulatory Visit (INDEPENDENT_AMBULATORY_CARE_PROVIDER_SITE_OTHER): Payer: Medicare Other | Admitting: Family Medicine

## 2016-05-28 ENCOUNTER — Encounter: Payer: Self-pay | Admitting: Family Medicine

## 2016-05-28 VITALS — BP 136/84 | HR 68 | Temp 98.1°F | Ht 63.0 in | Wt 140.5 lb

## 2016-05-28 DIAGNOSIS — I481 Persistent atrial fibrillation: Secondary | ICD-10-CM

## 2016-05-28 DIAGNOSIS — M5136 Other intervertebral disc degeneration, lumbar region: Secondary | ICD-10-CM

## 2016-05-28 DIAGNOSIS — I4819 Other persistent atrial fibrillation: Secondary | ICD-10-CM

## 2016-05-28 DIAGNOSIS — M48061 Spinal stenosis, lumbar region without neurogenic claudication: Secondary | ICD-10-CM

## 2016-05-28 DIAGNOSIS — E785 Hyperlipidemia, unspecified: Secondary | ICD-10-CM

## 2016-05-28 DIAGNOSIS — M810 Age-related osteoporosis without current pathological fracture: Secondary | ICD-10-CM | POA: Diagnosis not present

## 2016-05-28 DIAGNOSIS — Z0000 Encounter for general adult medical examination without abnormal findings: Secondary | ICD-10-CM | POA: Diagnosis not present

## 2016-05-28 LAB — CBC WITH DIFFERENTIAL/PLATELET
BASOS PCT: 1.2 % (ref 0.0–3.0)
Basophils Absolute: 0.1 10*3/uL (ref 0.0–0.1)
EOS PCT: 2.1 % (ref 0.0–5.0)
Eosinophils Absolute: 0.1 10*3/uL (ref 0.0–0.7)
HCT: 43.2 % (ref 36.0–46.0)
HEMOGLOBIN: 14.6 g/dL (ref 12.0–15.0)
LYMPHS ABS: 1.7 10*3/uL (ref 0.7–4.0)
Lymphocytes Relative: 26.7 % (ref 12.0–46.0)
MCHC: 33.9 g/dL (ref 30.0–36.0)
MCV: 92.6 fl (ref 78.0–100.0)
MONO ABS: 0.5 10*3/uL (ref 0.1–1.0)
Monocytes Relative: 7.5 % (ref 3.0–12.0)
NEUTROS ABS: 4 10*3/uL (ref 1.4–7.7)
Neutrophils Relative %: 62.5 % (ref 43.0–77.0)
Platelets: 237 10*3/uL (ref 150.0–400.0)
RBC: 4.67 Mil/uL (ref 3.87–5.11)
RDW: 13.4 % (ref 11.5–15.5)
WBC: 6.4 10*3/uL (ref 4.0–10.5)

## 2016-05-28 LAB — COMPREHENSIVE METABOLIC PANEL
ALT: 15 U/L (ref 0–35)
AST: 18 U/L (ref 0–37)
Albumin: 4.6 g/dL (ref 3.5–5.2)
Alkaline Phosphatase: 59 U/L (ref 39–117)
BUN: 15 mg/dL (ref 6–23)
CHLORIDE: 103 meq/L (ref 96–112)
CO2: 32 meq/L (ref 19–32)
Calcium: 10 mg/dL (ref 8.4–10.5)
Creatinine, Ser: 0.81 mg/dL (ref 0.40–1.20)
GFR: 71.43 mL/min (ref >60.00)
GLUCOSE: 113 mg/dL — AB (ref 70–99)
POTASSIUM: 3.8 meq/L (ref 3.5–5.1)
SODIUM: 141 meq/L (ref 135–145)
Total Bilirubin: 0.7 mg/dL (ref 0.2–1.2)
Total Protein: 7.2 g/dL (ref 6.0–8.3)

## 2016-05-28 LAB — LIPID PANEL
CHOL/HDL RATIO: 4
Cholesterol: 195 mg/dL (ref 0–200)
HDL: 53.7 mg/dL (ref >39.00)
LDL Cholesterol: 120 mg/dL — ABNORMAL HIGH (ref 0–99)
NONHDL: 141.25
Triglycerides: 107 mg/dL (ref 0.0–149.0)
VLDL: 21.4 mg/dL (ref 0.0–40.0)

## 2016-05-28 LAB — VITAMIN D 25 HYDROXY (VIT D DEFICIENCY, FRACTURES): VITD: 24.33 ng/mL — AB (ref 30.00–100.00)

## 2016-05-28 MED ORDER — GABAPENTIN 100 MG PO CAPS: ORAL_CAPSULE | ORAL | 3 refills | Status: DC

## 2016-05-28 MED ORDER — PREDNISONE 20 MG PO TABS: ORAL_TABLET | ORAL | 0 refills | Status: DC

## 2016-05-28 NOTE — Patient Instructions (Addendum)
Keep appointment with Dr Sharlet Salina I think you have flare of sciatica. Take prednisone taper.  Then trial gabapentin 100mg  nightly for 4 days then twice daily for 4 days and if tolerated increase to three times daily.  Return to see me next month for physical.

## 2016-05-28 NOTE — Progress Notes (Signed)
PCP notes:   Health maintenance:  Tetanus - postponed/insurance  Abnormal screenings:   Hearing - failed Fall risk - hx of multiple falls without injury  Patient concerns:   Pt has ongoing concern with back pain.   Nurse concerns:  None  Next PCP appt:   05/28/16 @ 1200

## 2016-05-28 NOTE — Progress Notes (Signed)
Pre visit review using our clinic review tool, if applicable. No additional management support is needed unless otherwise documented below in the visit note. 

## 2016-05-28 NOTE — Patient Instructions (Signed)
[name] , Thank you for taking time to come for your Medicare Wellness Visit. I appreciate your ongoing commitment to your health goals. Please review the following plan we discussed and let me know if I can assist you in the future.   These are the goals we discussed: Goals    . Increase physical activity          Starting 05/28/2016, I will continue to walk at least 10 min daily as tolerated.        This is a list of the screening recommended for you and due dates:  Health Maintenance  Topic Date Due  . DTaP/Tdap/Td vaccine (1 - Tdap) 12/07/2023*  . Tetanus Vaccine  12/07/2023*  . Flu Shot  Addressed  . DEXA scan (bone density measurement)  Completed  . Pneumonia vaccines  Completed  *Topic was postponed. The date shown is not the original due date.   Preventive Care for Adults  A healthy lifestyle and preventive care can promote health and wellness. Preventive health guidelines for adults include the following key practices.  . A routine yearly physical is a good way to check with your health care provider about your health and preventive screening. It is a chance to share any concerns and updates on your health and to receive a thorough exam.  . Visit your dentist for a routine exam and preventive care every 6 months. Brush your teeth twice a day and floss once a day. Good oral hygiene prevents tooth decay and gum disease.  . The frequency of eye exams is based on your age, health, family medical history, use  of contact lenses, and other factors. Follow your health care provider's ecommendations for frequency of eye exams.  . Eat a healthy diet. Foods like vegetables, fruits, whole grains, low-fat dairy products, and lean protein foods contain the nutrients you need without too many calories. Decrease your intake of foods high in solid fats, added sugars, and salt. Eat the right amount of calories for you. Get information about a proper diet from your health care provider,  if necessary.  . Regular physical exercise is one of the most important things you can do for your health. Most adults should get at least 150 minutes of moderate-intensity exercise (any activity that increases your heart rate and causes you to sweat) each week. In addition, most adults need muscle-strengthening exercises on 2 or more days a week.  Silver Sneakers may be a benefit available to you. To determine eligibility, you may visit the website: www.silversneakers.com or contact program at 365-534-5489 Mon-Fri between 8AM-8PM.   . Maintain a healthy weight. The body mass index (BMI) is a screening tool to identify possible weight problems. It provides an estimate of body fat based on height and weight. Your health care provider can find your BMI and can help you achieve or maintain a healthy weight.   For adults 20 years and older: ? A BMI below 18.5 is considered underweight. ? A BMI of 18.5 to 24.9 is normal. ? A BMI of 25 to 29.9 is considered overweight. ? A BMI of 30 and above is considered obese.   . Maintain normal blood lipids and cholesterol levels by exercising and minimizing your intake of saturated fat. Eat a balanced diet with plenty of fruit and vegetables. Blood tests for lipids and cholesterol should begin at age 45 and be repeated every 5 years. If your lipid or cholesterol levels are high, you are over 50, or you  are at high risk for heart disease, you may need your cholesterol levels checked more frequently. Ongoing high lipid and cholesterol levels should be treated with medicines if diet and exercise are not working.  . If you smoke, find out from your health care provider how to quit. If you do not use tobacco, please do not start.  . If you choose to drink alcohol, please do not consume more than 2 drinks per day. One drink is considered to be 12 ounces (355 mL) of beer, 5 ounces (148 mL) of wine, or 1.5 ounces (44 mL) of liquor.  . If you are 38-23 years old, ask  your health care provider if you should take aspirin to prevent strokes.  . Use sunscreen. Apply sunscreen liberally and repeatedly throughout the day. You should seek shade when your shadow is shorter than you. Protect yourself by wearing long sleeves, pants, a wide-brimmed hat, and sunglasses year round, whenever you are outdoors.  . Once a month, do a whole body skin exam, using a mirror to look at the skin on your back. Tell your health care provider of new moles, moles that have irregular borders, moles that are larger than a pencil eraser, or moles that have changed in shape or color.

## 2016-05-28 NOTE — Assessment & Plan Note (Signed)
Ongoing, no significant improvement after 3 ESI. Has f/u with PM&R, appreciate their care.  Exam consistent with acute flare of R lumbar radiculopathy in known lumbar stenosis.  Discussed further treatment options. Will treat with prednisone taper then start slow titration of gabapentin. Discussed side effects to monitor for. Pt agrees with plan.

## 2016-05-28 NOTE — Addendum Note (Signed)
Addended by: Ria Bush on: 05/28/2016 01:32 PM   Modules accepted: Level of Service

## 2016-05-28 NOTE — Progress Notes (Signed)
Subjective:   [name] is a 83 y.o. female who presents for Medicare Annual (Subsequent) preventive examination.  Review of Systems: N/A Cardiac Risk Factors include: advanced age (>53men, >74 women);hypertension;dyslipidemia     Objective:     Vitals: BP 136/84 (BP Location: Right Arm, Patient Position: Sitting, Cuff Size: Normal)   Pulse 68   Temp 98.1 F (36.7 C) (Oral)   Ht 5\' 3"  (1.6 m) Comment: no shoes  Wt 140 lb 8 oz (63.7 kg)   SpO2 98%   BMI 24.89 kg/m   Body mass index is 24.89 kg/m.   Tobacco History  Smoking Status  . Former Smoker  . Packs/day: 0.25  . Years: 1.00  . Quit date: 04/08/1976  Smokeless Tobacco  . Never Used    Comment: pt smokes occasionally "socially"     Counseling given: No   Past Medical History:  Diagnosis Date  . Abdominal aortic atherosclerosis ([place]) 09/2015   by xray  . Anxiety   . BCC (basal cell carcinoma of skin) 05/2011   on nose  . Cancer ([place])    skin cancer  . Chest pain    angina/chest pain  . DDD (degenerative disc disease), lumbosacral 12/06/1992  . Gallstones 09/2015   incidentally by xray  . GERD (gastroesophageal reflux disease) 05/1999  . History of shingles    ophthalmic, takes acyclovir daily  . Hyperlipidemia 12/2003  . Hypertension 1990  . Osteoarthritis 08/21/1989  . Osteoporosis with fracture 11/1997   with compression fracture T12  . Paroxysmal atrial flutter ([place]) 03/16/2012   on coumadin  . Rheumatoid arthritis ([place])   . RLS (restless legs syndrome)    Past Surgical History:  Procedure Laterality Date  . ABDOMINAL HYSTERECTOMY  Age 72 - 17   S/P TAH  . abdominal ultrasound  04/23/2004   Gallstones  . CATARACT EXTRACTION  05/2010   left eye  . CERVICAL DISCECTOMY  1992   Fusion (Dr. Joya Salm)  . ESI Bilateral 01/2016   S1 transforaminal ESI x2  . ESOPHAGOGASTRODUODENOSCOPY  01/01/2002   with ulcer, bx. neg; + stricture gastric ulcer with hemorrhage  . ESOPHAGOGASTRODUODENOSCOPY   04/23/2004   Esophageal stricture -- dilated  . nuclear stress test  03/2012   no ischemia  . SKIN CANCER EXCISION  05/20/2011   BCC from nose  . US ECHOCARDIOGRAPHY  03/2012   in flutter, EF 60%, mild-mod aortic, mitral, tricuspid regurg, mildly dilated LA   Family History  Problem Relation Age of Onset  . Emphysema Mother   . Heart disease Father     MI  . Stroke Father     first at age 1.  . Stroke Sister   . Diabetes Sister   . Hypertension Brother   . Heart disease Brother     Heart Valve  . Stroke Brother   . Diabetes Brother   . Cancer Brother     esophageal  . Diabetes Brother   . Diabetes Brother    History  Sexual Activity  . Sexual activity: No    Outpatient Encounter Prescriptions as of 05/28/2016  Medication Sig  . acetaminophen (TYLENOL) 500 MG tablet Take 500 mg by mouth every 6 (six) hours as needed for moderate pain.   Marland Kitchen acyclovir (ZOVIRAX) 400 MG tablet Take 400 mg by mouth daily.    Marland Kitchen apixaban (ELIQUIS) 5 MG TABS tablet Take 1 tablet (5 mg total) by mouth 2 (two) times daily.  Marland Kitchen atorvastatin (LIPITOR) 20 MG  tablet Take 1 tablet (20 mg total) by mouth daily. (Patient not taking: Reported on 05/28/2016)  . calcium-vitamin D (OSCAL WITH D) 500-200 MG-UNIT per tablet Take 1 tablet by mouth daily.    Marland Kitchen diltiazem (CARDIZEM CD) 120 MG 24 hr capsule Take 1 capsule (120 mg total) by mouth every evening.  . ferrous sulfate 325 (65 FE) MG tablet Take 1 tablet (325 mg total) by mouth daily with breakfast.  . hydrochlorothiazide (MICROZIDE) 12.5 MG capsule Take 1 capsule (12.5 mg total) by mouth daily.  . metoprolol tartrate (LOPRESSOR) 25 MG tablet TAKE ONE TABLET TWICE DAILY  . pantoprazole (PROTONIX) 40 MG tablet TAKE ONE TABLET EVERY DAY  . prednisoLONE acetate (PRED FORTE) 1 % ophthalmic suspension Place 1 drop into the left eye daily.   Marland Kitchen rOPINIRole (REQUIP) 1 MG tablet TAKE TWO TABLETS BY MOUTH AT BEDTIME  . venlafaxine (EFFEXOR) 37.5 MG tablet TAKE ONE  TABLET BY MOUTH TWICE DAILY WITH A MEAL  . vitamin B-12 (CYANOCOBALAMIN) 1000 MCG tablet Take 1,000 mcg by mouth daily.  . [DISCONTINUED] pantoprazole (PROTONIX) 40 MG tablet Take 40 mg by mouth daily.  . [DISCONTINUED] traMADol (ULTRAM) 50 MG tablet Take 50 mg by mouth every 12 (twelve) hours as needed.   No facility-administered encounter medications on file as of 05/28/2016.     Activities of Daily Living In your present state of health, do you have any difficulty performing the following activities: 05/28/2016  Hearing? Y  Vision? N  Difficulty concentrating or making decisions? Y  Walking or climbing stairs? Y  Dressing or bathing? N  Doing errands, shopping? N  Preparing Food and eating ? N  Using the Toilet? N  In the past six months, have you accidently leaked urine? N  Do you have problems with loss of bowel control? N  Managing your Medications? N  Managing your Finances? N  Housekeeping or managing your Housekeeping? Y  Some recent data might be hidden    Patient Care Team: Ria Bush, MD as PCP - General (Family Medicine) Wellington Hampshire, MD as Consulting Physician (Cardiology) Leandrew Koyanagi, MD as Referring Physician (Ophthalmology) Sharlet Salina, MD as Referring Physician (Physical Medicine and Rehabilitation)    Assessment:     Hearing Screening   125Hz  250Hz  500Hz  1000Hz  2000Hz  3000Hz  4000Hz  6000Hz  8000Hz   Right ear:   0 0 0  0    Left ear:   0 0 0  0    Vision Screening Comments: Last vision exam in October 2017. Future appt scheduled in March 2018   Exercise Activities and Dietary recommendations Current Exercise Habits: Home exercise routine, Type of exercise: walking, Time (Minutes): 10, Frequency (Times/Week): 7, Weekly Exercise (Minutes/Week): 70, Intensity: Mild, Exercise limited by: orthopedic condition(s)  Goals    . Increase physical activity          Starting 05/28/2016, I will continue to walk at least 10 min daily as tolerated.         Fall Risk Fall Risk  05/28/2016 12/08/2014 04/14/2012  Falls in the past year? Yes Yes Yes  Number falls in past yr: 2 or more 1 1  Injury with Fall? No Yes No  Some recent data might be hidden   Depression Screen PHQ 2/9 Scores 05/28/2016 12/08/2014 04/14/2012  PHQ - 2 Score 0 0 0  Some recent data might be hidden     Cognitive Function MMSE - Mini Mental State Exam 05/28/2016  Orientation to time 5  Orientation to  Place 5  Registration 3  Attention/ Calculation 0  Recall 3  Language- name 2 objects 0  Language- repeat 1  Language- follow 3 step command 3  Language- read & follow direction 0  Write a sentence 0  Copy design 0  Total score 20  Some recent data might be hidden     PLEASE NOTE: A Mini-Cog screen was completed. Maximum score is 20. A value of 0 denotes this part of [place] MMSE was not completed or the patient failed this part of the Mini-Cog screening.   Mini-Cog Screening Orientation to Time - Max 5 pts Orientation to Place - Max 5 pts Registration - Max 3 pts Recall - Max 3 pts Language Repeat - Max 1 pts Language Follow 3 Step Command - Max 3 pts     Immunization History  Administered Date(s) Administered  . Influenza Split 03/20/2011  . Influenza,inj,Quad PF,36+ Mos 12/08/2014, 12/04/2015  . Influenza-Unspecified 03/03/2013  . Pneumococcal Conjugate-13 01/18/2015  . Pneumococcal Polysaccharide-23 12/08/2003  . Td 12/08/2003   Screening Tests Health Maintenance  Topic Date Due  . DTaP/Tdap/Td (1 - Tdap) 12/07/2023 (Originally 12/09/2003)  . TETANUS/TDAP  12/07/2023 (Originally 12/07/2013)  . INFLUENZA VACCINE  Addressed  . DEXA SCAN  Completed  . PNA vac Low Risk Adult  Completed      Plan:  I have personally reviewed and addressed the Medicare Annual Wellness questionnaire and have noted the following in the patient's chart:  A. Medical and social history B. Use of alcohol, tobacco or illicit drugs  C. Current medications and  supplements D. Functional ability and status E.  Nutritional status F.  Physical activity G. Advance directives H. List of other physicians I.  Hospitalizations, surgeries, and ER visits in previous 12 months J.  [place] to include hearing, vision, cognitive, depression L. Referrals and appointments - none  In addition, I have reviewed and discussed with patient certain preventive protocols, quality metrics, and best practice recommendations. A written personalized care plan for preventive services as well as general preventive health recommendations were provided to patient.  See attached scanned questionnaire for additional information.   Signed,   Lindell Noe, MHA, BS, LPN Health Coach

## 2016-05-28 NOTE — Progress Notes (Addendum)
BP 136/84 (BP Location: Right Arm, Patient Position: Sitting, Cuff Size: Normal)   Pulse 68   Temp 98.1 F (36.7 C) (Oral)   Ht 5\' 3"  (1.6 m) Comment: shoes  Wt 140 lb 8 oz (63.7 kg)   SpO2 98%   BMI 24.89 kg/m    CC: back pain Subjective:    Patient ID: [name], female    DOB: 03-13-1930, 83 y.o.   MRN: [id number]  HPI: [name] is a 83 y.o. female presenting on 05/28/2016 for No chief complaint on file.   Saw Katha Cabal today for medicare wellness visit. Note will be reviewed.    Here for acute visit to discuss ongoing back pain. PM&R records reviewed as well as recent imaging. Sees Dr Sharlet Salina for known disc extrusion at L3/4 s/p multiple lumbar epidural injections, latest 04/18/2016 (bilateral L4/5 transforaminal ESI). S/p tramadol (ineffective), tizanidine, prednisone course (helpful), norco (sedating and causing some depression). PT didn't help. Pain described as dull ache, currently midline lower back with radiation to right buttock and lateral thigh. Denies paresthesias, numbness, weakness. Denies inciting trauma/injury or fall.   Prior MRI 12/2015 showed severe DDD at L5/S1 with disc bulging and central protrusion, moderate central stenosis and severe B foraminal stenosis. L4/5 with moderate broad based isc bulge leading to moderate-severe central canal stenosis, mild R and mod L foraminal stenosis. L3/4 with mod-large disc extrusion with severe central stenosis, mod R and mod-severe L foraminal stenosis. L2/3 with mod R and mild L foraminal stenosis.   Relevant past medical, surgical, family and social history reviewed and updated as indicated. Interim medical history since our last visit reviewed. Allergies and medications reviewed and updated. Outpatient Medications Prior to Visit  Medication Sig Dispense Refill  . acetaminophen (TYLENOL) 500 MG tablet Take 500 mg by mouth every 6 (six) hours as needed for moderate pain.     Marland Kitchen acyclovir (ZOVIRAX) 400 MG tablet Take  400 mg by mouth daily.      Marland Kitchen apixaban (ELIQUIS) 5 MG TABS tablet Take 1 tablet (5 mg total) by mouth 2 (two) times daily. 60 tablet 6  . calcium-vitamin D (OSCAL WITH D) 500-200 MG-UNIT per tablet Take 1 tablet by mouth daily.      Marland Kitchen diltiazem (CARDIZEM CD) 120 MG 24 hr capsule Take 1 capsule (120 mg total) by mouth every evening. 90 capsule 3  . ferrous sulfate 325 (65 FE) MG tablet Take 1 tablet (325 mg total) by mouth daily with breakfast.    . hydrochlorothiazide (MICROZIDE) 12.5 MG capsule Take 1 capsule (12.5 mg total) by mouth daily. 30 capsule 11  . metoprolol tartrate (LOPRESSOR) 25 MG tablet TAKE ONE TABLET TWICE DAILY 60 tablet 0  . pantoprazole (PROTONIX) 40 MG tablet TAKE ONE TABLET EVERY DAY 30 tablet 0  . prednisoLONE acetate (PRED FORTE) 1 % ophthalmic suspension Place 1 drop into the left eye daily.     Marland Kitchen rOPINIRole (REQUIP) 1 MG tablet TAKE TWO TABLETS BY MOUTH AT BEDTIME 60 tablet 6  . venlafaxine (EFFEXOR) 37.5 MG tablet TAKE ONE TABLET BY MOUTH TWICE DAILY WITH A MEAL 60 tablet 3  . vitamin B-12 (CYANOCOBALAMIN) 1000 MCG tablet Take 1,000 mcg by mouth daily.    Marland Kitchen atorvastatin (LIPITOR) 20 MG tablet Take 1 tablet (20 mg total) by mouth daily. (Patient not taking: Reported on 05/28/2016) 30 tablet 0   No facility-administered medications prior to visit.      Per HPI unless specifically indicated in ROS  section below Review of Systems     Objective:    BP 136/84 (BP Location: Right Arm, Patient Position: Sitting, Cuff Size: Normal)   Pulse 68   Temp 98.1 F (36.7 C) (Oral)   Ht 5\' 3"  (1.6 m) Comment: shoes  Wt 140 lb 8 oz (63.7 kg)   SpO2 98%   BMI 24.89 kg/m   Wt Readings from Last 3 Encounters:  05/28/16 140 lb 8 oz (63.7 kg)  12/14/15 151 lb 8 oz (68.7 kg)  12/04/15 150 lb 8 oz (68.3 kg)    Physical Exam  Constitutional: She is oriented to person, place, and time. She appears well-developed and well-nourished. No distress.  Musculoskeletal: She exhibits no  edema.  No pain midline spine No paraspinous mm tenderness R buttock pain with L SLR No pain with int/ext rotation at hip. No pain at SIJ, GTB bilaterally.  + pain at R sciatic notch  Neurological: She is alert and oriented to person, place, and time.  Sensation intact 5/5 strength BLE  Skin: Skin is warm and dry. No rash noted.  Nursing note and vitals reviewed.  Lab Results  Component Value Date   CREATININE 0.96 04/24/2015       Assessment & Plan:   Problem List Items Addressed This Visit    DDD (degenerative disc disease), lumbar - Primary    Ongoing, no significant improvement after 3 ESI. Has f/u with PM&R, appreciate their care.  Exam consistent with acute flare of R lumbar radiculopathy in known lumbar stenosis.  Discussed further treatment options. Will treat with prednisone taper then start slow titration of gabapentin. Discussed side effects to monitor for. Pt agrees with plan.       Relevant Medications   predniSONE (DELTASONE) 20 MG tablet    Other Visit Diagnoses    Spinal stenosis of lumbar region, unspecified whether neurogenic claudication present           Follow up plan: Return in about 1 month (around 06/25/2016) for annual exam, prior fasting for blood work.  Ria Bush, MD

## 2016-06-01 ENCOUNTER — Other Ambulatory Visit: Payer: Self-pay | Admitting: Family Medicine

## 2016-06-01 ENCOUNTER — Encounter: Payer: Self-pay | Admitting: Family Medicine

## 2016-06-01 DIAGNOSIS — I10 Essential (primary) hypertension: Secondary | ICD-10-CM

## 2016-06-01 MED ORDER — VITAMIN D3 25 MCG (1000 UT) PO CAPS: 1.0000 | ORAL_CAPSULE | Freq: Every day | ORAL | Status: DC

## 2016-06-02 NOTE — Progress Notes (Signed)
I reviewed health advisor's note, was available for consultation, and agree with documentation and plan.  

## 2016-06-03 DIAGNOSIS — M5416 Radiculopathy, lumbar region: Secondary | ICD-10-CM | POA: Diagnosis not present

## 2016-06-03 DIAGNOSIS — M5136 Other intervertebral disc degeneration, lumbar region: Secondary | ICD-10-CM | POA: Diagnosis not present

## 2016-06-03 DIAGNOSIS — M48062 Spinal stenosis, lumbar region with neurogenic claudication: Secondary | ICD-10-CM | POA: Diagnosis not present

## 2016-06-11 ENCOUNTER — Encounter: Payer: Self-pay | Admitting: Cardiovascular Disease

## 2016-06-11 ENCOUNTER — Ambulatory Visit (INDEPENDENT_AMBULATORY_CARE_PROVIDER_SITE_OTHER): Payer: Medicare Other | Admitting: Cardiovascular Disease

## 2016-06-11 VITALS — BP 150/76 | HR 110 | Ht 63.0 in | Wt 146.5 lb

## 2016-06-11 DIAGNOSIS — I480 Paroxysmal atrial fibrillation: Secondary | ICD-10-CM | POA: Diagnosis not present

## 2016-06-11 DIAGNOSIS — I10 Essential (primary) hypertension: Secondary | ICD-10-CM | POA: Diagnosis not present

## 2016-06-11 DIAGNOSIS — E785 Hyperlipidemia, unspecified: Secondary | ICD-10-CM | POA: Diagnosis not present

## 2016-06-11 MED ORDER — METOPROLOL TARTRATE 50 MG PO TABS: 50.0000 mg | ORAL_TABLET | Freq: Two times a day (BID) | ORAL | 1 refills | Status: DC

## 2016-06-11 NOTE — Progress Notes (Signed)
Cardiology Office Note   Date:  06/11/2016   ID:  [name], DOB 1929/12/23, MRN [id number]  PCP:  Ria Bush, MD  Cardiologist:   [name], MD   Chief Complaint  Patient presents with  . Other    6 month follow up. Patient c/o having SOB and believes it is coming from her back pain. Meds reviewed verbally with patient.       History of Present Illness: [name] is a 83 y.o. female who presents for a follow-up visit regarding paroxysmal atrial fibrillation. Nuclear stress test in 03/2012 showed no evidence of ischemia. Echocardiogram showed normal LV systolic function, mild to moderate aortic and mitral regurgitation, moderate tricuspid regurgitation with mild pulmonary hypertension.   She has been having difficulty with lower back pain as well as bilateral leg pain and numbness. She had steroids injections with no relief. She is noted to be in atrial fibrillation today. She denies any palpitations but she does complain of increased shortness of breath without chest pain.   Past Medical History:  Diagnosis Date  . Abdominal aortic atherosclerosis ([place]) 09/2015   by xray  . Anxiety   . BCC (basal cell carcinoma of skin) 05/2011   on nose  . Cancer ([place])    skin cancer  . Chest pain    angina/chest pain  . DDD (degenerative disc disease), lumbosacral 12/06/1992  . Gallstones 09/2015   incidentally by xray  . GERD (gastroesophageal reflux disease) 05/1999  . History of shingles    ophthalmic, takes acyclovir daily  . Hyperlipidemia 12/2003  . Hypertension 1990  . Osteoarthritis 08/21/1989  . Osteoporosis with fracture 11/1997   with compression fracture T12  . Paroxysmal atrial flutter ([place]) 03/16/2012   on coumadin  . Rheumatoid arthritis ([place])   . RLS (restless legs syndrome)     Past Surgical History:  Procedure Laterality Date  . ABDOMINAL HYSTERECTOMY  Age 36 - 75   S/P TAH  . abdominal ultrasound  04/23/2004   Gallstones  .  CATARACT EXTRACTION  05/2010   left eye  . CERVICAL DISCECTOMY  1992   Fusion (Dr. Joya Salm)  . ESI Bilateral 01/2016   S1 transforaminal ESI x2  . ESOPHAGOGASTRODUODENOSCOPY  01/01/2002   with ulcer, bx. neg; + stricture gastric ulcer with hemorrhage  . ESOPHAGOGASTRODUODENOSCOPY  04/23/2004   Esophageal stricture -- dilated  . nuclear stress test  03/2012   no ischemia  . SKIN CANCER EXCISION  05/20/2011   BCC from nose  . US ECHOCARDIOGRAPHY  03/2012   in flutter, EF 60%, mild-mod aortic, mitral, tricuspid regurg, mildly dilated LA     Current Outpatient Prescriptions  Medication Sig Dispense Refill  . acetaminophen (TYLENOL) 500 MG tablet Take 500 mg by mouth every 6 (six) hours as needed for moderate pain.     Marland Kitchen acyclovir (ZOVIRAX) 400 MG tablet Take 400 mg by mouth daily.      Marland Kitchen apixaban (ELIQUIS) 5 MG TABS tablet Take 1 tablet (5 mg total) by mouth 2 (two) times daily. 60 tablet 6  . atorvastatin (LIPITOR) 20 MG tablet Take 1 tablet (20 mg total) by mouth daily. 30 tablet 0  . calcium-vitamin D (OSCAL WITH D) 500-200 MG-UNIT per tablet Take 1 tablet by mouth daily.      . Cholecalciferol (VITAMIN D3) 1000 units CAPS Take 1 capsule (1,000 Units total) by mouth daily. 30 capsule   . diltiazem (CARDIZEM CD) 120 MG 24 hr capsule Take  1 capsule (120 mg total) by mouth every evening. 90 capsule 3  . ferrous sulfate 325 (65 FE) MG tablet Take 1 tablet (325 mg total) by mouth daily with breakfast.    . gabapentin (NEURONTIN) 100 MG capsule Take 1 daily for 4 days then increase to twice daily for 4 days then take TID 90 capsule 3  . hydrochlorothiazide (MICROZIDE) 12.5 MG capsule Take 1 capsule (12.5 mg total) by mouth daily. 30 capsule 11  . metoprolol tartrate (LOPRESSOR) 25 MG tablet TAKE ONE TABLET TWICE DAILY 60 tablet 0  . pantoprazole (PROTONIX) 40 MG tablet TAKE ONE TABLET EVERY DAY 30 tablet 0  . prednisoLONE acetate (PRED FORTE) 1 % ophthalmic suspension Place 1 drop into the  left eye daily.     . predniSONE (DELTASONE) 20 MG tablet Take two tablets daily for 3 days followed by one tablet daily for 4 days 10 tablet 0  . rOPINIRole (REQUIP) 1 MG tablet TAKE TWO TABLETS BY MOUTH AT BEDTIME 60 tablet 6  . venlafaxine (EFFEXOR) 37.5 MG tablet TAKE ONE TABLET BY MOUTH TWICE DAILY WITH A MEAL 60 tablet 3  . vitamin B-12 (CYANOCOBALAMIN) 1000 MCG tablet Take 1,000 mcg by mouth daily.     No current facility-administered medications for this visit.     Allergies:   Penicillins    Social History:  The patient  reports that she quit smoking about 40 years ago. She has a 0.25 pack-year smoking history. She has never used smokeless tobacco. She reports that she drinks about 1.2 oz of alcohol per week . She reports that she does not use drugs.   Family History:  The patient's family history includes Cancer in her brother; Diabetes in her brother, brother, brother, and sister; Emphysema in her mother; Heart disease in her brother and father; Hypertension in her brother; Stroke in her brother, father, and sister.    ROS:  Please see the history of present illness.   Otherwise, review of systems are positive for none.   All other systems are reviewed and negative.    PHYSICAL EXAM: VS:  BP (!) 150/76 (BP Location: Left Arm, Patient Position: Sitting, Cuff Size: Normal)   Pulse (!) 110   Ht 5\' 3"  (1.6 m)   Wt 146 lb 8 oz (66.5 kg)   BMI 25.95 kg/m  , BMI Body mass index is 25.95 kg/m. GEN: Well nourished, well developed, in no acute distress  HEENT: normal  Neck: no JVD, carotid bruits, or masses Cardiac: Irregularly irregular and mildly tachycardic; no murmurs, rubs, or gallops,no edema  Respiratory:  clear to auscultation bilaterally, normal work of breathing GI: soft, nontender, nondistended, + BS MS: no deformity or atrophy  Skin: warm and dry, no rash Neuro:  Strength and sensation are intact Psych: euthymic mood, full affect Normal distal pulses  EKG:  EKG is  ordered today. The ekg ordered today demonstrates  atrial fibrillation with rapid ventricular response. Ventricular rate is 110 bpm.   Recent Labs: 05/28/2016: ALT 15; BUN 15; Creatinine, Ser 0.81; Hemoglobin 14.6; Platelets 237.0; Potassium 3.8; Sodium 141    Lipid Panel    Component Value Date/Time   CHOL 195 05/28/2016 1253   TRIG 107.0 05/28/2016 1253   HDL 53.70 05/28/2016 1253   CHOLHDL 4 05/28/2016 1253   VLDL 21.4 05/28/2016 1253   LDLCALC 120 (H) 05/28/2016 1253   LDLDIRECT 173.1 02/19/2010 0908      Wt Readings from Last 3 Encounters:  06/11/16 146 lb  8 oz (66.5 kg)  05/28/16 140 lb 8 oz (63.7 kg)  05/28/16 140 lb 8 oz (63.7 kg)       No flowsheet data found.    ASSESSMENT AND PLAN:  1.   Paroxysmal atrial fibrillation: She is noted to be in atrial fibrillation with rapid ventricular response today with mild dyspnea. This could be triggered by her her lower back and leg pain. In order to simplify her rate controlling medications, I elected to discontinue diltiazem and increased metoprolol to 50 mg twice daily. She did have previous bradycardia while in sinus rhythm. She is tolerating anticoagulation with Eliquis. I reviewed her labs from last month which were unremarkable. She is known to have normal renal function.    2. Essential hypertension: Blood pressure is controlled on current medications.  3. Hyperlipidemia: Currently on atorvastatin 20 mg once daily with most recent LDL of 75.  4. Bilateral leg pain: She inquired about PAD but her distal pulses are normal. I suspect that her symptoms are related to lower back issues.  Disposition:   FU with me in 3  months  Signed,  [name], MD  06/11/2016 2:41 PM    [place]

## 2016-06-11 NOTE — Patient Instructions (Addendum)
Medication Instructions:  Your physician has recommended you make the following change in your medication:  STOP taking diltazem INCREASE metoprolol to 50mg  twice daily   Labwork: none  Testing/Procedures: none  Follow-Up: Your physician recommends that you schedule a follow-up appointment in: 3 months with Dr. Fletcher Anon.    Any Other Special Instructions Will Be Listed Below (If Applicable).     If you need a refill on your cardiac medications before your next appointment, please call your pharmacy.

## 2016-06-19 ENCOUNTER — Telehealth: Payer: Self-pay

## 2016-06-19 ENCOUNTER — Other Ambulatory Visit: Payer: Self-pay | Admitting: Family Medicine

## 2016-06-19 MED ORDER — ATORVASTATIN CALCIUM 20 MG PO TABS: 20.0000 mg | ORAL_TABLET | Freq: Every day | ORAL | 1 refills | Status: DC

## 2016-06-19 NOTE — Telephone Encounter (Signed)
Pt has not checked my chart for lab results; pt notified as instructed from my chart message; pt voiced understanding. Pt has previously taken lipitor 20 mg but has not taken since 01/2016 and pt is not sure why she has not taken med. Pt did not have problem with lipitor 20 mg. [facility]. Pt request cb after Dr Darnell Level reviews.

## 2016-06-19 NOTE — Telephone Encounter (Signed)
plz notify patient lipitor refilled.

## 2016-06-20 NOTE — Telephone Encounter (Signed)
Patient notified

## 2016-06-27 ENCOUNTER — Ambulatory Visit (INDEPENDENT_AMBULATORY_CARE_PROVIDER_SITE_OTHER): Payer: Medicare Other | Admitting: Family Medicine

## 2016-06-27 ENCOUNTER — Encounter: Payer: Self-pay | Admitting: Family Medicine

## 2016-06-27 VITALS — BP 134/96 | HR 80 | Temp 97.4°F | Wt 144.5 lb

## 2016-06-27 DIAGNOSIS — I10 Essential (primary) hypertension: Secondary | ICD-10-CM

## 2016-06-27 DIAGNOSIS — M5136 Other intervertebral disc degeneration, lumbar region: Secondary | ICD-10-CM

## 2016-06-27 DIAGNOSIS — N289 Disorder of kidney and ureter, unspecified: Secondary | ICD-10-CM

## 2016-06-27 DIAGNOSIS — E785 Hyperlipidemia, unspecified: Secondary | ICD-10-CM

## 2016-06-27 DIAGNOSIS — K219 Gastro-esophageal reflux disease without esophagitis: Secondary | ICD-10-CM

## 2016-06-27 DIAGNOSIS — Z0000 Encounter for general adult medical examination without abnormal findings: Secondary | ICD-10-CM | POA: Diagnosis not present

## 2016-06-27 DIAGNOSIS — F331 Major depressive disorder, recurrent, moderate: Secondary | ICD-10-CM | POA: Diagnosis not present

## 2016-06-27 DIAGNOSIS — M810 Age-related osteoporosis without current pathological fracture: Secondary | ICD-10-CM | POA: Diagnosis not present

## 2016-06-27 DIAGNOSIS — I700 Atherosclerosis of aorta: Secondary | ICD-10-CM | POA: Diagnosis not present

## 2016-06-27 DIAGNOSIS — Z1231 Encounter for screening mammogram for malignant neoplasm of breast: Secondary | ICD-10-CM

## 2016-06-27 DIAGNOSIS — Z1239 Encounter for other screening for malignant neoplasm of breast: Secondary | ICD-10-CM

## 2016-06-27 DIAGNOSIS — I4819 Other persistent atrial fibrillation: Secondary | ICD-10-CM

## 2016-06-27 DIAGNOSIS — Z7189 Other specified counseling: Secondary | ICD-10-CM | POA: Diagnosis not present

## 2016-06-27 DIAGNOSIS — I481 Persistent atrial fibrillation: Secondary | ICD-10-CM

## 2016-06-27 NOTE — Assessment & Plan Note (Signed)
Continue statin. 

## 2016-06-27 NOTE — Progress Notes (Signed)
BP (!) 134/96   Pulse 80   Temp 97.4 F (36.3 C) (Oral)   Wt 144 lb 8 oz (65.5 kg)   BMI 25.60 kg/m    CC: CPE Subjective:    Patient ID: [name], female    DOB: 01-30-1930, 83 y.o.   MRN: [id number]  HPI: [name] is a 83 y.o. female presenting on 06/27/2016 for Annual Exam   Saw Katha Cabal last month for medicare wellness visit. Note reviewed. Failed hearing. She has noticed this but declines audiology referral. Multiple falls noted - in yard without injury.   Sees Dr Sharlet Salina for ongoing chronic lower back pain with radiation to buttocks. Thought multifactorial spinal stenosis, disc herniation and lumbar radiculitis. Last MRI 12/2015. Planned continued tylenol, tramadol, gabapentin, and lumbosacral brace, planned f/u 96mo. ESI did not help. Has been recommended against surgery. Saw Dr Fletcher Anon recently for parox afib. Med changes made. Lipitor fell off med list, restarted 06/19/2016.   Preventative: Colon cancer screening - age out. Normal iFOBs last 05/2012 Well woman - she has had a hysterectomy. We have stopped Pap smears.  Mammogram normal 08/2012. No concerns - no fmhx, self breast exams at home. Agrees to repeat.  DEXA - known osteoporosis with compression fracture. No recent bone density scan. Agrees to repeat. Flu - yearly Td - 2005 Pneumovax 2005, prevnar 01/2015 Shingles shot - not interested in zostavax - h/o ocular shingles. Advanced planning - Has living will at home and asked to bring me copy - son [name] is [place]. Does not want extended life support. Seat belt use discussed Sunscreen use discussed. No changing moles on skin.  Ex smoker remotely quit Alcohol - rarely  Lives with grandson who smokes, dog From [place]. Widowed 01/1999; 1st marriage 25 years / 2nd marriage 13 years. One son deceased 2023-01-28. Activity: active with dog Diet: good water, fruits/vegetables daily  Relevant past medical, surgical, family and social history reviewed and updated as  indicated. Interim medical history since our last visit reviewed. Allergies and medications reviewed and updated. Outpatient Medications Prior to Visit  Medication Sig Dispense Refill  . acetaminophen (TYLENOL) 500 MG tablet Take 500 mg by mouth every 6 (six) hours as needed for moderate pain.     Marland Kitchen acyclovir (ZOVIRAX) 400 MG tablet Take 400 mg by mouth daily.      Marland Kitchen apixaban (ELIQUIS) 5 MG TABS tablet Take 1 tablet (5 mg total) by mouth 2 (two) times daily. 60 tablet 6  . atorvastatin (LIPITOR) 20 MG tablet Take 1 tablet (20 mg total) by mouth daily. 90 tablet 1  . calcium-vitamin D (OSCAL WITH D) 500-200 MG-UNIT per tablet Take 1 tablet by mouth daily.      . Cholecalciferol (VITAMIN D3) 1000 units CAPS Take 1 capsule (1,000 Units total) by mouth daily. 30 capsule   . ferrous sulfate 325 (65 FE) MG tablet Take 1 tablet (325 mg total) by mouth daily with breakfast.    . gabapentin (NEURONTIN) 100 MG capsule Take 1 daily for 4 days then increase to twice daily for 4 days then take TID 90 capsule 3  . hydrochlorothiazide (MICROZIDE) 12.5 MG capsule Take 1 capsule (12.5 mg total) by mouth daily. 30 capsule 11  . metoprolol (LOPRESSOR) 50 MG tablet Take 1 tablet (50 mg total) by mouth 2 (two) times daily. 180 tablet 1  . pantoprazole (PROTONIX) 40 MG tablet TAKE ONE TABLET EVERY DAY 30 tablet 0  . prednisoLONE acetate (PRED FORTE) 1 %  ophthalmic suspension Place 1 drop into the left eye daily.     Marland Kitchen rOPINIRole (REQUIP) 1 MG tablet TAKE TWO TABLETS BY MOUTH AT BEDTIME 60 tablet 6  . venlafaxine (EFFEXOR) 37.5 MG tablet TAKE ONE TABLET BY MOUTH TWICE DAILY WITH A MEAL 60 tablet 3  . vitamin B-12 (CYANOCOBALAMIN) 1000 MCG tablet Take 1,000 mcg by mouth daily.    . predniSONE (DELTASONE) 20 MG tablet Take two tablets daily for 3 days followed by one tablet daily for 4 days 10 tablet 0   No facility-administered medications prior to visit.      Per HPI unless specifically indicated in ROS section  below Review of Systems  Constitutional: Negative for activity change, appetite change, chills, fatigue, fever and unexpected weight change.  HENT: Negative for hearing loss.   Eyes: Negative for visual disturbance.  Respiratory: Negative for cough, chest tightness, shortness of breath and wheezing.   Cardiovascular: Negative for chest pain, palpitations and leg swelling.  Gastrointestinal: Negative for abdominal distention, abdominal pain, blood in stool, constipation, diarrhea, nausea and vomiting.  Genitourinary: Negative for difficulty urinating and hematuria.  Musculoskeletal: Positive for back pain and gait problem. Negative for arthralgias, myalgias and neck pain.  Skin: Negative for rash.  Neurological: Negative for dizziness, seizures, syncope and headaches.  Hematological: Negative for adenopathy. Does not bruise/bleed easily.  Psychiatric/Behavioral: Negative for dysphoric mood. The patient is not nervous/anxious.        Objective:    BP (!) 134/96   Pulse 80   Temp 97.4 F (36.3 C) (Oral)   Wt 144 lb 8 oz (65.5 kg)   BMI 25.60 kg/m   Wt Readings from Last 3 Encounters:  06/27/16 144 lb 8 oz (65.5 kg)  06/11/16 146 lb 8 oz (66.5 kg)  05/28/16 140 lb 8 oz (63.7 kg)    Physical Exam  Constitutional: She is oriented to person, place, and time. She appears well-developed and well-nourished. No distress.  HENT:  Head: Normocephalic and atraumatic.  Right Ear: Hearing, tympanic membrane, external ear and ear canal normal.  Left Ear: Hearing, tympanic membrane, external ear and ear canal normal.  Nose: Nose normal.  Mouth/Throat: Uvula is midline, oropharynx is clear and moist and mucous membranes are normal. No oropharyngeal exudate, posterior oropharyngeal edema or posterior oropharyngeal erythema.  Eyes: Conjunctivae and EOM are normal. Pupils are equal, round, and reactive to light. No scleral icterus.  Neck: Normal range of motion. Neck supple. Carotid bruit is not  present. No thyromegaly present.  Cardiovascular: Normal rate, normal heart sounds and intact distal pulses.  An irregularly irregular rhythm present.  No murmur heard. Pulses:      Radial pulses are 2+ on the right side, and 2+ on the left side.  Pulmonary/Chest: Effort normal and breath sounds normal. No respiratory distress. She has no wheezes. She has no rales.  Abdominal: Soft. Bowel sounds are normal. She exhibits no distension and no mass. There is no tenderness. There is no rebound and no guarding.  Musculoskeletal: Normal range of motion. She exhibits no edema.  Lymphadenopathy:    She has no cervical adenopathy.  Neurological: She is alert and oriented to person, place, and time.  CN grossly intact, station and gait intact  Skin: Skin is warm and dry. No rash noted.  Psychiatric: She has a normal mood and affect. Her behavior is normal. Judgment and thought content normal.  Nursing note and vitals reviewed.  Results for orders placed or performed in visit on  05/28/16  Lipid panel  Result Value Ref Range   Cholesterol 195 0 - 200 mg/dL   Triglycerides 107.0 0.0 - 149.0 mg/dL   HDL 53.70 >39.00 mg/dL   VLDL 21.4 0.0 - 40.0 mg/dL   LDL Cholesterol 120 (H) 0 - 99 mg/dL   Total CHOL/HDL Ratio 4    NonHDL 141.25   Comprehensive metabolic panel  Result Value Ref Range   Sodium 141 135 - 145 mEq/L   Potassium 3.8 3.5 - 5.1 mEq/L   Chloride 103 96 - 112 mEq/L   CO2 32 19 - 32 mEq/L   Glucose, Bld 113 (H) 70 - 99 mg/dL   BUN 15 6 - 23 mg/dL   Creatinine, Ser 0.81 0.40 - 1.20 mg/dL   Total Bilirubin 0.7 0.2 - 1.2 mg/dL   Alkaline Phosphatase 59 39 - 117 U/L   AST 18 0 - 37 U/L   ALT 15 0 - 35 U/L   Total Protein 7.2 6.0 - 8.3 g/dL   Albumin 4.6 3.5 - 5.2 g/dL   Calcium 10.0 8.4 - 10.5 mg/dL   GFR 71.43 >60.00 mL/min  CBC with Differential/Platelet  Result Value Ref Range   WBC 6.4 4.0 - 10.5 K/uL   RBC 4.67 3.87 - 5.11 Mil/uL   Hemoglobin 14.6 12.0 - 15.0 g/dL   HCT  43.2 36.0 - 46.0 %   MCV 92.6 78.0 - 100.0 fl   MCHC 33.9 30.0 - 36.0 g/dL   RDW 13.4 11.5 - 15.5 %   Platelets 237.0 150.0 - 400.0 K/uL   Neutrophils Relative % 62.5 43.0 - 77.0 %   Lymphocytes Relative 26.7 12.0 - 46.0 %   Monocytes Relative 7.5 3.0 - 12.0 %   Eosinophils Relative 2.1 0.0 - 5.0 %   Basophils Relative 1.2 0.0 - 3.0 %   Neutro Abs 4.0 1.4 - 7.7 K/uL   Lymphs Abs 1.7 0.7 - 4.0 K/uL   Monocytes Absolute 0.5 0.1 - 1.0 K/uL   Eosinophils Absolute 0.1 0.0 - 0.7 K/uL   Basophils Absolute 0.1 0.0 - 0.1 K/uL  VITAMIN D 25 Hydroxy (Vit-D Deficiency, Fractures)  Result Value Ref Range   VITD 24.33 (L) 30.00 - 100.00 ng/mL      Assessment & Plan:   Problem List Items Addressed This Visit    Abdominal aortic atherosclerosis (HCC)    Continue statin.       Advanced care planning/counseling discussion    Advanced planning - Has living will at home and asked to bring me copy - son [name] is [place]. Does not want extended life support.       DDD (degenerative disc disease), lumbar    Chronic pain from known spinal stenosis, HNP, lumbar radiculitis. Sees PMR. Failed ESI. Limited options. Consider continued titration of gabapentin.      Essential hypertension    Chronic, adequate. No changes made today.       GERD    Stable period on daily PPI.       Health maintenance examination - Primary    Preventative protocols reviewed and updated unless pt declined. Discussed healthy diet and lifestyle.       HLD (hyperlipidemia)    Chronic, deteriorated however this was off lipitor. We have restarted.       MDD (major depressive disorder), recurrent episode ([place])    Situational - related to functional limitations from chronic back pain. Continue effexor 37.5mg  BID      Osteoporosis    Compliant  with calcium/vit D and extra 1000 IU vit D.  Discussed DEXA scan (did not complete last year) - pt agrees to proceed with this to determine need for further intervention  (bisphosphonate).      Relevant Orders   DG Bone Density   Persistent atrial fibrillation (HCC)    Chronic, stable. Irregular today. Continues eliquis       RESOLVED: Renal insufficiency    Improved. Will resolve.       Other Visit Diagnoses    Breast cancer screening       Relevant Orders   MM Digital Diagnostic Bilat       Follow up plan: Return in about 1 year (around 06/27/2017) for annual exam, prior fasting for blood work, medicare wellness visit.  Ria Bush, MD

## 2016-06-27 NOTE — Assessment & Plan Note (Signed)
Chronic, deteriorated however this was off lipitor. We have restarted.

## 2016-06-27 NOTE — Assessment & Plan Note (Signed)
Advanced planning - Has living will at home and asked to bring me copy - son Dominica Severin is [place]. Does not want extended life support.

## 2016-06-27 NOTE — Patient Instructions (Addendum)
We will set you up for mammogram and bone density scan.  Bring me copy of your living will.  Continue current medicines.  Return as needed or in 1 year for next medicare wellness/physical.   Health Maintenance, Female Adopting a healthy lifestyle and getting preventive care can go a long way to promote health and wellness. Talk with your health care provider about what schedule of regular examinations is right for you. This is a good chance for you to check in with your provider about disease prevention and staying healthy. In between checkups, there are plenty of things you can do on your own. Experts have done a lot of research about which lifestyle changes and preventive measures are most likely to keep you healthy. Ask your health care provider for more information. Weight and diet Eat a healthy diet  Be sure to include plenty of vegetables, fruits, low-fat dairy products, and lean protein.  Do not eat a lot of foods high in solid fats, added sugars, or salt.  Get regular exercise. This is one of the most important things you can do for your health.  Most adults should exercise for at least 150 minutes each week. The exercise should increase your heart rate and make you sweat (moderate-intensity exercise).  Most adults should also do strengthening exercises at least twice a week. This is in addition to the moderate-intensity exercise. Maintain a healthy weight  Body mass index (BMI) is a measurement that can be used to identify possible weight problems. It estimates body fat based on height and weight. Your health care provider can help determine your BMI and help you achieve or maintain a healthy weight.  For females 22 years of age and older:  A BMI below 18.5 is considered underweight.  A BMI of 18.5 to 24.9 is normal.  A BMI of 25 to 29.9 is considered overweight.  A BMI of 30 and above is considered obese. Watch levels of cholesterol and blood lipids  You should start  having your blood tested for lipids and cholesterol at 81 years of age, then have this test every 5 years.  You may need to have your cholesterol levels checked more often if:  Your lipid or cholesterol levels are high.  You are older than 81 years of age.  You are at high risk for heart disease. Cancer screening Lung Cancer  Lung cancer screening is recommended for adults 32-62 years old who are at high risk for lung cancer because of a history of smoking.  A yearly low-dose CT scan of the lungs is recommended for people who:  Currently smoke.  Have quit within the past 15 years.  Have at least a 30-pack-year history of smoking. A pack year is smoking an average of one pack of cigarettes a day for 1 year.  Yearly screening should continue until it has been 15 years since you quit.  Yearly screening should stop if you develop a health problem that would prevent you from having lung cancer treatment. Breast Cancer  Practice breast self-awareness. This means understanding how your breasts normally appear and feel.  It also means doing regular breast self-exams. Let your health care provider know about any changes, no matter how small.  If you are in your 20s or 30s, you should have a clinical breast exam (CBE) by a health care provider every 1-3 years as part of a regular health exam.  If you are 16 or older, have a CBE every year. Also consider  having a breast X-ray (mammogram) every year.  If you have a family history of breast cancer, talk to your health care provider about genetic screening.  If you are at high risk for breast cancer, talk to your health care provider about having an MRI and a mammogram every year.  Breast cancer gene (BRCA) assessment is recommended for women who have family members with BRCA-related cancers. BRCA-related cancers include:  Breast.  Ovarian.  Tubal.  Peritoneal cancers.  Results of the assessment will determine the need for genetic  counseling and BRCA1 and BRCA2 testing. Cervical Cancer  Your health care provider may recommend that you be screened regularly for cancer of the pelvic organs (ovaries, uterus, and vagina). This screening involves a pelvic examination, including checking for microscopic changes to the surface of your cervix (Pap test). You may be encouraged to have this screening done every 3 years, beginning at age 17.  For women ages 83-65, health care providers may recommend pelvic exams and Pap testing every 3 years, or they may recommend the Pap and pelvic exam, combined with testing for human papilloma virus (HPV), every 5 years. Some types of HPV increase your risk of cervical cancer. Testing for HPV may also be done on women of any age with unclear Pap test results.  Other health care providers may not recommend any screening for nonpregnant women who are considered low risk for pelvic cancer and who do not have symptoms. Ask your health care provider if a screening pelvic exam is right for you.  If you have had past treatment for cervical cancer or a condition that could lead to cancer, you need Pap tests and screening for cancer for at least 20 years after your treatment. If Pap tests have been discontinued, your risk factors (such as having a new sexual partner) need to be reassessed to determine if screening should resume. Some women have medical problems that increase the chance of getting cervical cancer. In these cases, your health care provider may recommend more frequent screening and Pap tests. Colorectal Cancer  This type of cancer can be detected and often prevented.  Routine colorectal cancer screening usually begins at 81 years of age and continues through 81 years of age.  Your health care provider may recommend screening at an earlier age if you have risk factors for colon cancer.  Your health care provider may also recommend using home test kits to check for hidden blood in the stool.  A  small camera at the end of a tube can be used to examine your colon directly (sigmoidoscopy or colonoscopy). This is done to check for the earliest forms of colorectal cancer.  Routine screening usually begins at age 41.  Direct examination of the colon should be repeated every 5-10 years through 81 years of age. However, you may need to be screened more often if early forms of precancerous polyps or small growths are found. Skin Cancer  Check your skin from head to toe regularly.  Tell your health care provider about any new moles or changes in moles, especially if there is a change in a mole's shape or color.  Also tell your health care provider if you have a mole that is larger than the size of a pencil eraser.  Always use sunscreen. Apply sunscreen liberally and repeatedly throughout the day.  Protect yourself by wearing long sleeves, pants, a wide-brimmed hat, and sunglasses whenever you are outside. Heart disease, diabetes, and high blood pressure  High blood  pressure causes heart disease and increases the risk of stroke. High blood pressure is more likely to develop in:  People who have blood pressure in the high end of the normal range (130-139/85-89 mm Hg).  People who are overweight or obese.  People who are African American.  If you are 35-52 years of age, have your blood pressure checked every 3-5 years. If you are 22 years of age or older, have your blood pressure checked every year. You should have your blood pressure measured twice-once when you are at a hospital or clinic, and once when you are not at a hospital or clinic. Record the average of the two measurements. To check your blood pressure when you are not at a hospital or clinic, you can use:  An automated blood pressure machine at a pharmacy.  A home blood pressure monitor.  If you are between 16 years and 69 years old, ask your health care provider if you should take aspirin to prevent strokes.  Have regular  diabetes screenings. This involves taking a blood sample to check your fasting blood sugar level.  If you are at a normal weight and have a low risk for diabetes, have this test once every three years after 81 years of age.  If you are overweight and have a high risk for diabetes, consider being tested at a younger age or more often. Preventing infection Hepatitis B  If you have a higher risk for hepatitis B, you should be screened for this virus. You are considered at high risk for hepatitis B if:  You were born in a country where hepatitis B is common. Ask your health care provider which countries are considered high risk.  Your parents were born in a high-risk country, and you have not been immunized against hepatitis B (hepatitis B vaccine).  You have HIV or AIDS.  You use needles to inject street drugs.  You live with someone who has hepatitis B.  You have had sex with someone who has hepatitis B.  You get hemodialysis treatment.  You take certain medicines for conditions, including cancer, organ transplantation, and autoimmune conditions. Hepatitis C  Blood testing is recommended for:  Everyone born from [birth year] through 1963.  Anyone with known risk factors for hepatitis C. Sexually transmitted infections (STIs)  You should be screened for sexually transmitted infections (STIs) including gonorrhea and chlamydia if:  You are sexually active and are younger than 81 years of age.  You are older than 81 years of age and your health care provider tells you that you are at risk for this type of infection.  Your sexual activity has changed since you were last screened and you are at an increased risk for chlamydia or gonorrhea. Ask your health care provider if you are at risk.  If you do not have HIV, but are at risk, it may be recommended that you take a prescription medicine daily to prevent HIV infection. This is called pre-exposure prophylaxis (PrEP). You are considered at  risk if:  You are sexually active and do not regularly use condoms or know the HIV status of your partner(s).  You take drugs by injection.  You are sexually active with a partner who has HIV. Talk with your health care provider about whether you are at high risk of being infected with HIV. If you choose to begin PrEP, you should first be tested for HIV. You should then be tested every 3 months for as long as you  are taking PrEP. Pregnancy  If you are premenopausal and you may become pregnant, ask your health care provider about preconception counseling.  If you may become pregnant, take 400 to 800 micrograms (mcg) of folic acid every day.  If you want to prevent pregnancy, talk to your health care provider about birth control (contraception). Osteoporosis and menopause  Osteoporosis is a disease in which the bones lose minerals and strength with aging. This can result in serious bone fractures. Your risk for osteoporosis can be identified using a bone density scan.  If you are 54 years of age or older, or if you are at risk for osteoporosis and fractures, ask your health care provider if you should be screened.  Ask your health care provider whether you should take a calcium or vitamin D supplement to lower your risk for osteoporosis.  Menopause may have certain physical symptoms and risks.  Hormone replacement therapy may reduce some of these symptoms and risks. Talk to your health care provider about whether hormone replacement therapy is right for you. Follow these instructions at home:  Schedule regular health, dental, and eye exams.  Stay current with your immunizations.  Do not use any tobacco products including cigarettes, chewing tobacco, or electronic cigarettes.  If you are pregnant, do not drink alcohol.  If you are breastfeeding, limit how much and how often you drink alcohol.  Limit alcohol intake to no more than 1 drink per day for nonpregnant women. One drink  equals 12 ounces of beer, 5 ounces of wine, or 1 ounces of hard liquor.  Do not use street drugs.  Do not share needles.  Ask your health care provider for help if you need support or information about quitting drugs.  Tell your health care provider if you often feel depressed.  Tell your health care provider if you have ever been abused or do not feel safe at home. This information is not intended to replace advice given to you by your health care provider. Make sure you discuss any questions you have with your health care provider. Document Released: 10/08/2010 Document Revised: 08/31/2015 Document Reviewed: 12/27/2014 Elsevier Interactive Patient Education  2017 [employer].

## 2016-06-27 NOTE — Progress Notes (Signed)
Pre visit review using our clinic review tool, if applicable. No additional management support is needed unless otherwise documented below in the visit note. 

## 2016-06-27 NOTE — Assessment & Plan Note (Addendum)
Compliant with calcium/vit D and extra 1000 IU vit D.  Discussed DEXA scan (did not complete last year) - pt agrees to proceed with this to determine need for further intervention (bisphosphonate).

## 2016-06-27 NOTE — Assessment & Plan Note (Signed)
Stable period on daily PPI  

## 2016-06-27 NOTE — Assessment & Plan Note (Addendum)
Chronic pain from known spinal stenosis, HNP, lumbar radiculitis. Sees PMR. Failed ESI. Limited options. Consider continued titration of gabapentin.

## 2016-06-27 NOTE — Assessment & Plan Note (Signed)
Preventative protocols reviewed and updated unless pt declined. Discussed healthy diet and lifestyle.  

## 2016-06-27 NOTE — Assessment & Plan Note (Signed)
Improved.  Will resolve. 

## 2016-06-27 NOTE — Assessment & Plan Note (Signed)
Chronic, adequate. No changes made today.

## 2016-06-27 NOTE — Assessment & Plan Note (Signed)
Situational - related to functional limitations from chronic back pain. Continue effexor 37.5mg  BID

## 2016-06-27 NOTE — Assessment & Plan Note (Signed)
Chronic, stable. Irregular today. Continues eliquis

## 2016-07-23 DIAGNOSIS — L821 Other seborrheic keratosis: Secondary | ICD-10-CM | POA: Diagnosis not present

## 2016-07-23 DIAGNOSIS — L814 Other melanin hyperpigmentation: Secondary | ICD-10-CM | POA: Diagnosis not present

## 2016-07-23 DIAGNOSIS — L570 Actinic keratosis: Secondary | ICD-10-CM | POA: Diagnosis not present

## 2016-07-23 DIAGNOSIS — L820 Inflamed seborrheic keratosis: Secondary | ICD-10-CM | POA: Diagnosis not present

## 2016-07-23 DIAGNOSIS — Z85828 Personal history of other malignant neoplasm of skin: Secondary | ICD-10-CM | POA: Diagnosis not present

## 2016-07-26 DIAGNOSIS — M48062 Spinal stenosis, lumbar region with neurogenic claudication: Secondary | ICD-10-CM | POA: Diagnosis not present

## 2016-07-26 DIAGNOSIS — M5136 Other intervertebral disc degeneration, lumbar region: Secondary | ICD-10-CM | POA: Diagnosis not present

## 2016-07-26 DIAGNOSIS — M5416 Radiculopathy, lumbar region: Secondary | ICD-10-CM | POA: Diagnosis not present

## 2016-07-27 ENCOUNTER — Other Ambulatory Visit: Payer: Self-pay | Admitting: Family Medicine

## 2016-07-29 NOTE — Telephone Encounter (Signed)
Ok to refill? Last filled 01/10/16 #[id number]

## 2016-08-05 ENCOUNTER — Ambulatory Visit
Admission: RE | Admit: 2016-08-05 | Discharge: 2016-08-05 | Disposition: A | Discharge status: Home / Self Care | Payer: Medicare Other | Source: Ambulatory Visit | Attending: Family Medicine | Admitting: Family Medicine

## 2016-08-05 DIAGNOSIS — Z1239 Encounter for other screening for malignant neoplasm of breast: Secondary | ICD-10-CM

## 2016-08-05 DIAGNOSIS — Z780 Asymptomatic menopausal state: Secondary | ICD-10-CM | POA: Diagnosis not present

## 2016-08-05 DIAGNOSIS — M810 Age-related osteoporosis without current pathological fracture: Secondary | ICD-10-CM | POA: Diagnosis not present

## 2016-08-05 DIAGNOSIS — Z1231 Encounter for screening mammogram for malignant neoplasm of breast: Secondary | ICD-10-CM | POA: Diagnosis not present

## 2016-08-06 ENCOUNTER — Other Ambulatory Visit: Payer: Self-pay | Admitting: Family Medicine

## 2016-08-06 DIAGNOSIS — R928 Other abnormal and inconclusive findings on diagnostic imaging of breast: Secondary | ICD-10-CM

## 2016-08-07 ENCOUNTER — Other Ambulatory Visit: Payer: Self-pay | Admitting: Dentistry

## 2016-08-07 DIAGNOSIS — S0300XA Dislocation of jaw, unspecified side, initial encounter: Secondary | ICD-10-CM

## 2016-08-08 ENCOUNTER — Ambulatory Visit
Admission: RE | Admit: 2016-08-08 | Discharge: 2016-08-08 | Disposition: A | Discharge status: Home / Self Care | Payer: Medicare Other | Source: Ambulatory Visit | Attending: Dentistry | Admitting: Dentistry

## 2016-08-08 DIAGNOSIS — S0300XA Dislocation of jaw, unspecified side, initial encounter: Secondary | ICD-10-CM

## 2016-08-08 DIAGNOSIS — R6884 Jaw pain: Secondary | ICD-10-CM | POA: Diagnosis not present

## 2016-08-12 ENCOUNTER — Other Ambulatory Visit: Payer: Medicare Other

## 2016-08-15 ENCOUNTER — Other Ambulatory Visit: Payer: Self-pay | Admitting: Family Medicine

## 2016-08-19 ENCOUNTER — Other Ambulatory Visit: Payer: Self-pay | Admitting: Family Medicine

## 2016-08-19 ENCOUNTER — Ambulatory Visit
Admission: RE | Admit: 2016-08-19 | Discharge: 2016-08-19 | Disposition: A | Discharge status: Home / Self Care | Payer: Medicare Other | Source: Ambulatory Visit | Attending: Family Medicine | Admitting: Family Medicine

## 2016-08-19 DIAGNOSIS — R928 Other abnormal and inconclusive findings on diagnostic imaging of breast: Secondary | ICD-10-CM

## 2016-08-19 DIAGNOSIS — N6489 Other specified disorders of breast: Secondary | ICD-10-CM | POA: Diagnosis not present

## 2016-08-21 ENCOUNTER — Ambulatory Visit
Admission: RE | Admit: 2016-08-21 | Discharge: 2016-08-21 | Disposition: A | Discharge status: Home / Self Care | Payer: Medicare Other | Source: Ambulatory Visit | Attending: Family Medicine | Admitting: Family Medicine

## 2016-08-21 ENCOUNTER — Other Ambulatory Visit: Payer: Self-pay | Admitting: Family Medicine

## 2016-08-21 DIAGNOSIS — R928 Other abnormal and inconclusive findings on diagnostic imaging of breast: Secondary | ICD-10-CM

## 2016-08-21 DIAGNOSIS — N6489 Other specified disorders of breast: Secondary | ICD-10-CM | POA: Diagnosis not present

## 2016-08-21 DIAGNOSIS — Z1231 Encounter for screening mammogram for malignant neoplasm of breast: Secondary | ICD-10-CM | POA: Diagnosis not present

## 2016-08-22 ENCOUNTER — Other Ambulatory Visit: Payer: Self-pay | Admitting: Family Medicine

## 2016-08-22 DIAGNOSIS — N6310 Unspecified lump in the right breast, unspecified quadrant: Secondary | ICD-10-CM

## 2016-08-27 ENCOUNTER — Ambulatory Visit
Admission: RE | Admit: 2016-08-27 | Discharge: 2016-08-27 | Disposition: A | Discharge status: Home / Self Care | Payer: Medicare Other | Source: Ambulatory Visit | Attending: Family Medicine | Admitting: Family Medicine

## 2016-08-27 DIAGNOSIS — N6310 Unspecified lump in the right breast, unspecified quadrant: Secondary | ICD-10-CM

## 2016-08-27 DIAGNOSIS — N6311 Unspecified lump in the right breast, upper outer quadrant: Secondary | ICD-10-CM | POA: Diagnosis not present

## 2016-08-27 DIAGNOSIS — N6489 Other specified disorders of breast: Secondary | ICD-10-CM | POA: Diagnosis not present

## 2016-09-04 ENCOUNTER — Telehealth: Payer: Self-pay

## 2016-09-04 NOTE — Telephone Encounter (Signed)
Pt left v/m pt last seen 06/27/16; pt having back problems; pt has had injections at [facility] at pain clinic that have not helped; pt does not have another appt at [facility] until 11/2016. Pt request a back brace;pt will give more info where to order back brace when cb.  Unable to reach pt by phone; no answer and no v/m.

## 2016-09-05 NOTE — Telephone Encounter (Signed)
Lumbar support brace Rx written and in CMA box.

## 2016-09-05 NOTE — Telephone Encounter (Signed)
Patient advised.  Rx left at front desk for pick up. 

## 2016-09-05 NOTE — Telephone Encounter (Signed)
Pt left v/m requesting cb about back brace.

## 2016-09-06 NOTE — Telephone Encounter (Signed)
Order faxed as requested

## 2016-09-06 NOTE — Telephone Encounter (Signed)
Kate,[facility],called.  Theycan't accept a written rx. They will be faxing a form for Dr.G to sign.  After signed, please fax form to [phone] and [phone]. Any questions, Anda Kraft can be reached at [phone]. Form will be faxed to me.  As soon as I receive the form, I'll put it in Dr.G's in box.

## 2016-09-06 NOTE — Telephone Encounter (Signed)
Rx signed and in [place] box.

## 2016-09-09 DIAGNOSIS — M545 Low back pain: Secondary | ICD-10-CM | POA: Diagnosis not present

## 2016-09-09 DIAGNOSIS — M438X9 Other specified deforming dorsopathies, site unspecified: Secondary | ICD-10-CM | POA: Diagnosis not present

## 2016-09-12 ENCOUNTER — Ambulatory Visit: Payer: Medicare Other | Admitting: Cardiovascular Disease

## 2016-09-13 ENCOUNTER — Encounter: Payer: Self-pay | Admitting: Cardiovascular Disease

## 2016-09-19 ENCOUNTER — Other Ambulatory Visit: Payer: Self-pay | Admitting: General Surgery

## 2016-09-19 DIAGNOSIS — R928 Other abnormal and inconclusive findings on diagnostic imaging of breast: Secondary | ICD-10-CM | POA: Diagnosis not present

## 2016-09-19 DIAGNOSIS — I10 Essential (primary) hypertension: Secondary | ICD-10-CM | POA: Diagnosis not present

## 2016-09-19 DIAGNOSIS — Z7901 Long term (current) use of anticoagulants: Secondary | ICD-10-CM | POA: Diagnosis not present

## 2016-09-19 DIAGNOSIS — Z9889 Other specified postprocedural states: Secondary | ICD-10-CM | POA: Diagnosis not present

## 2016-09-19 DIAGNOSIS — K219 Gastro-esophageal reflux disease without esophagitis: Secondary | ICD-10-CM | POA: Diagnosis not present

## 2016-09-25 ENCOUNTER — Other Ambulatory Visit: Payer: Self-pay | Admitting: Family Medicine

## 2016-09-29 ENCOUNTER — Encounter: Payer: Self-pay | Admitting: Family Medicine

## 2016-09-29 DIAGNOSIS — N6310 Unspecified lump in the right breast, unspecified quadrant: Secondary | ICD-10-CM | POA: Insufficient documentation

## 2016-09-30 ENCOUNTER — Telehealth: Payer: Self-pay | Admitting: Cardiovascular Disease

## 2016-09-30 NOTE — Telephone Encounter (Signed)
Received fax from [facility] for cardiac clearance for breast lumpectomy with radioactive seed placement under general anesthesia. Date TBD. Pt last seen in office March 2018 w/recommendation for 3 month f/u. She will need appt before clearance will be considered. Routed to scheduling for sooner appt.

## 2016-10-01 NOTE — Telephone Encounter (Signed)
Pt is coming in on 10/07/16 to see Christell Faith

## 2016-10-04 ENCOUNTER — Encounter: Payer: Self-pay | Admitting: Physician Assistant

## 2016-10-06 HISTORY — PX: BREAST LUMPECTOMY WITH RADIOACTIVE SEED LOCALIZATION: SHX6424

## 2016-10-07 ENCOUNTER — Encounter: Payer: Self-pay | Admitting: Physician Assistant

## 2016-10-07 ENCOUNTER — Ambulatory Visit (INDEPENDENT_AMBULATORY_CARE_PROVIDER_SITE_OTHER): Payer: Medicare Other | Admitting: Physician Assistant

## 2016-10-07 ENCOUNTER — Telehealth: Payer: Self-pay | Admitting: Physician Assistant

## 2016-10-07 VITALS — BP 156/84 | HR 101 | Ht 62.0 in | Wt 138.8 lb

## 2016-10-07 DIAGNOSIS — I10 Essential (primary) hypertension: Secondary | ICD-10-CM

## 2016-10-07 DIAGNOSIS — E785 Hyperlipidemia, unspecified: Secondary | ICD-10-CM | POA: Diagnosis not present

## 2016-10-07 DIAGNOSIS — Z01810 Encounter for preprocedural cardiovascular examination: Secondary | ICD-10-CM

## 2016-10-07 DIAGNOSIS — N649 Disorder of breast, unspecified: Secondary | ICD-10-CM | POA: Diagnosis not present

## 2016-10-07 DIAGNOSIS — I480 Paroxysmal atrial fibrillation: Secondary | ICD-10-CM | POA: Diagnosis not present

## 2016-10-07 MED ORDER — METOPROLOL TARTRATE 50 MG PO TABS: 75.0000 mg | ORAL_TABLET | Freq: Two times a day (BID) | ORAL | 3 refills | Status: DC

## 2016-10-07 NOTE — Telephone Encounter (Signed)
Faxed OV notes which addressed clearance for breast lumpectomy to [facility], [phone] Attn: Arlean Hopping, CMA
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
## 2016-10-07 NOTE — Patient Instructions (Signed)
Medication Instructions:  Your physician has recommended you make the following change in your medication:  INCREASE lopressor (metoprolol) to 75mg  twice daily   Labwork: none  Testing/Procedures: none  Follow-Up: Your physician recommends that you schedule a follow-up appointment in: 3 months with Christell Faith, PA-C or Dr. Fletcher Anon.    Any Other Special Instructions Will Be Listed Below (If Applicable). Stop Eliquis two days before your procedure. You may restart 24 hours after your procedure OR as instructed by Dr. Dalbert Batman.      If you need a refill on your cardiac medications before your next appointment, please call your pharmacy.

## 2016-10-11 ENCOUNTER — Other Ambulatory Visit: Payer: Self-pay | Admitting: General Surgery

## 2016-10-11 DIAGNOSIS — R928 Other abnormal and inconclusive findings on diagnostic imaging of breast: Secondary | ICD-10-CM

## 2016-10-29 ENCOUNTER — Other Ambulatory Visit: Payer: Self-pay | Admitting: Family Medicine

## 2016-10-29 ENCOUNTER — Encounter (HOSPITAL_BASED_OUTPATIENT_CLINIC_OR_DEPARTMENT_OTHER): Payer: Self-pay | Admitting: *Deleted

## 2016-10-30 ENCOUNTER — Other Ambulatory Visit: Payer: Self-pay | Admitting: Family Medicine

## 2016-10-31 ENCOUNTER — Encounter (HOSPITAL_BASED_OUTPATIENT_CLINIC_OR_DEPARTMENT_OTHER)
Admission: RE | Admit: 2016-10-31 | Discharge: 2016-10-31 | Disposition: A | Discharge status: Home / Self Care | Payer: Medicare Other | Source: Ambulatory Visit | Attending: General Surgery | Admitting: General Surgery

## 2016-10-31 DIAGNOSIS — Z7901 Long term (current) use of anticoagulants: Secondary | ICD-10-CM | POA: Diagnosis not present

## 2016-10-31 DIAGNOSIS — F419 Anxiety disorder, unspecified: Secondary | ICD-10-CM | POA: Diagnosis not present

## 2016-10-31 DIAGNOSIS — K219 Gastro-esophageal reflux disease without esophagitis: Secondary | ICD-10-CM | POA: Diagnosis not present

## 2016-10-31 DIAGNOSIS — G2581 Restless legs syndrome: Secondary | ICD-10-CM | POA: Diagnosis not present

## 2016-10-31 DIAGNOSIS — F329 Major depressive disorder, single episode, unspecified: Secondary | ICD-10-CM | POA: Diagnosis not present

## 2016-10-31 DIAGNOSIS — D509 Iron deficiency anemia, unspecified: Secondary | ICD-10-CM | POA: Diagnosis not present

## 2016-10-31 DIAGNOSIS — I10 Essential (primary) hypertension: Secondary | ICD-10-CM | POA: Diagnosis not present

## 2016-10-31 DIAGNOSIS — I4891 Unspecified atrial fibrillation: Secondary | ICD-10-CM | POA: Diagnosis not present

## 2016-10-31 DIAGNOSIS — Z90710 Acquired absence of both cervix and uterus: Secondary | ICD-10-CM | POA: Diagnosis not present

## 2016-10-31 DIAGNOSIS — R011 Cardiac murmur, unspecified: Secondary | ICD-10-CM | POA: Diagnosis not present

## 2016-10-31 DIAGNOSIS — Z87891 Personal history of nicotine dependence: Secondary | ICD-10-CM | POA: Diagnosis not present

## 2016-10-31 DIAGNOSIS — Z8711 Personal history of peptic ulcer disease: Secondary | ICD-10-CM | POA: Diagnosis not present

## 2016-10-31 DIAGNOSIS — M1990 Unspecified osteoarthritis, unspecified site: Secondary | ICD-10-CM | POA: Diagnosis not present

## 2016-10-31 DIAGNOSIS — Z800 Family history of malignant neoplasm of digestive organs: Secondary | ICD-10-CM | POA: Diagnosis not present

## 2016-10-31 DIAGNOSIS — I340 Nonrheumatic mitral (valve) insufficiency: Secondary | ICD-10-CM | POA: Diagnosis not present

## 2016-10-31 DIAGNOSIS — E785 Hyperlipidemia, unspecified: Secondary | ICD-10-CM | POA: Diagnosis not present

## 2016-10-31 DIAGNOSIS — N6021 Fibroadenosis of right breast: Secondary | ICD-10-CM | POA: Diagnosis not present

## 2016-10-31 DIAGNOSIS — Z79899 Other long term (current) drug therapy: Secondary | ICD-10-CM | POA: Diagnosis not present

## 2016-10-31 DIAGNOSIS — Z880 Allergy status to penicillin: Secondary | ICD-10-CM | POA: Diagnosis not present

## 2016-10-31 LAB — CBC WITH DIFFERENTIAL/PLATELET
BASOS ABS: 0.1 10*3/uL (ref 0.0–0.1)
BASOS PCT: 1 %
EOS ABS: 0.2 10*3/uL (ref 0.0–0.7)
EOS PCT: 2 %
HCT: 40.1 % (ref 36.0–46.0)
Hemoglobin: 13.5 g/dL (ref 12.0–15.0)
LYMPHS ABS: 1.7 10*3/uL (ref 0.7–4.0)
Lymphocytes Relative: 22 %
MCH: 30.4 pg (ref 26.0–34.0)
MCHC: 33.7 g/dL (ref 30.0–36.0)
MCV: 90.3 fL (ref 78.0–100.0)
Monocytes Absolute: 0.6 10*3/uL (ref 0.1–1.0)
Monocytes Relative: 8 %
Neutro Abs: 5 10*3/uL (ref 1.7–7.7)
Neutrophils Relative %: 67 %
PLATELETS: 247 10*3/uL (ref 150–400)
RBC: 4.44 MIL/uL (ref 3.87–5.11)
RDW: 13.1 % (ref 11.5–15.5)
WBC: 7.4 10*3/uL (ref 4.0–10.5)

## 2016-10-31 LAB — COMPREHENSIVE METABOLIC PANEL
ALBUMIN: 4.1 g/dL (ref 3.5–5.0)
ALT: 22 U/L (ref 14–54)
ANION GAP: 10 (ref 5–15)
AST: 27 U/L (ref 15–41)
Alkaline Phosphatase: 71 U/L (ref 38–126)
BUN: 11 mg/dL (ref 6–20)
CHLORIDE: 105 mmol/L (ref 101–111)
CO2: 24 mmol/L (ref 22–32)
CREATININE: 0.86 mg/dL (ref 0.44–1.00)
Calcium: 9.4 mg/dL (ref 8.9–10.3)
GFR calc non Af Amer: 60 mL/min — ABNORMAL LOW (ref >60)
Glucose, Bld: 105 mg/dL — ABNORMAL HIGH (ref 65–99)
POTASSIUM: 4.2 mmol/L (ref 3.5–5.1)
SODIUM: 139 mmol/L (ref 135–145)
Total Bilirubin: 0.7 mg/dL (ref 0.3–1.2)
Total Protein: 6.8 g/dL (ref 6.5–8.1)

## 2016-10-31 NOTE — H&P (Signed)
Eric Form Location: [place] Office Patient #: [id number] DOB: 1929-11-15 Unknown / Language: Cleophus Molt / Race: White Female       History of Present Illness The patient is a 83 year old female who presents with a breast mass. this is a pleasant 83 year old female who lives in [place] but is followed by left lower cardiology and lower primary care at [facility]. Dr. Danise Mina is her PCP. Dr. Fletcher Anon is her cardiologist. she is referred by Dr. Everlean Alstrom at the [facility] for evaluation of complex sclerosing lesion right breast upper outer quadrant. Yasmin Izola Price was present with me throughout the encounter as a chaperone  she has no prior breast problems. Last mammogram was May 2014. Her PCP sent her for screening mammography and they found a 9 mm area mass and distortion in the right breast at the 10 o'clock position only 2 cm from the nipple. Image guided biopsy shows complex worsening lesion with calcifications. She was referred for excision. She is developed a lot of swelling in the breast and has a good sized hematoma.  Past history is negative for breast problems. She has atrial fibrillation and takes Ellik was chronically. Hyperlipidemia. Hypertension. Takes iron for anemia. History cervical spine surgery. Restless leg syndrome. GERD. Family history is negative for breast or ovarian cancer. Sister had pancreatic cancer Social history reveals she is a widow. Lives alone in [place]. Independent drops her groin does lots of gardening. Denies tobacco. Takes alcohol occasionally.  I discussed the histology and applications of CSL. I quoted a 5-9% risk of upgrade to early cancer when completely excised. I told her she probably did not have cancer. I advised excision and told her I would wait 5 weeks or more to let the hematoma resolve. She likes that idea but does want to have this excised to be sure. She does not want to take any risk. I discussed the  indications, details, techniques, and numerous risk of the surgery with her. She is aware of the risk of bleeding, infection, reoperation if cancer, cosmetic deformity, nerve damage with chronic pain, and other unforeseen problems. She understands these issues well. All of her questions were answered. She agrees with this plan.  We will ask her cardiologist to allow [place] to stop the Eliquis 4 days preop We will plan surgery after July 20 to allow the hematoma to resolve   Allergies  Penicillins  Allergies Reconciled   Medication History  Tylenol Extra Strength (500MG  Tablet, Oral) Active. Acyclovir (400MG  Tablet, Oral) Active. Eliquis (5MG  Tablet, Oral) Active. Lipitor (20MG  Tablet, Oral) Active. Oscal 500/200 D-3 (500-200MG -UNIT Tablet, Oral) Active. Vitamin D3 (1000UNIT Capsule, Oral) Active. Ferrous Sulfate (325 (65 Fe)MG Tablet, Oral) Active. Gabapentin & Diet Manage Prod (300MG  Misc, Oral) Active. Gabapentin (100MG  Capsule, Oral) Active. HydroCHLOROthiazide (12.5MG  Tablet, Oral) Active. Effexor XR (37.5MG  Capsule ER 24HR, Oral) Active. Protonix (20MG  Tablet DR, Oral) Active. Requip (1MG  Tablet, Oral) Active. Medications Reconciled  Vitals  Weight: 140.8 lb Height: 62in Body Surface Area: 1.65 m Body Mass Index: 25.75 kg/m  Temp.: 97.23F  Pulse: 77 (Regular)  BP: 136/80 (Sitting, Left Arm, Standard)    Physical Exam  General Mental Status-Alert. General Appearance-Consistent with stated age. Hydration-Well hydrated. Voice-Normal.  Head and Neck Head-normocephalic, atraumatic with no lesions or palpable masses. Trachea-midline. Thyroid Gland Characteristics - normal size and consistency. Note: scar left anterior cervical area well-healed   Eye Eyeball - Bilateral-Extraocular movements intact. Sclera/Conjunctiva - Bilateral-No scleral icterus.  Chest and Lung Exam Chest and lung exam  reveals -quiet, even and  easy respiratory effort with no use of accessory muscles and on auscultation, normal breath sounds, no adventitious sounds and normal vocal resonance. Inspection Chest Wall - Normal. Back - normal.  Breast Note: 5 cm hematoma right breast, just outside and above the areolar margin. No other skin changes or masses in either breast. No axillary adenopathy.   Cardiovascular Cardiovascular examination reveals -normal heart sounds, regular rate and rhythm with no murmurs and normal pedal pulses bilaterally.  Abdomen Inspection Inspection of the abdomen reveals - No Hernias. Skin - Scar - Note: lower midline scar from hysterectomy seems well-healed. Palpation/Percussion Palpation and Percussion of the abdomen reveal - Soft, Non Tender, No Rebound tenderness, No Rigidity (guarding) and No hepatosplenomegaly. Auscultation Auscultation of the abdomen reveals - Bowel sounds normal.  Neurologic Neurologic evaluation reveals -alert and oriented x 3 with no impairment of recent or remote memory. Mental Status-Normal.  Musculoskeletal Normal Exam - Left-Upper Extremity Strength Normal and Lower Extremity Strength Normal. Normal Exam - Right-Upper Extremity Strength Normal and Lower Extremity Strength Normal.  Lymphatic Head & Neck  General Head & Neck Lymphatics: Bilateral - Description - Normal. Axillary  General Axillary Region: Bilateral - Description - Normal. Tenderness - Non Tender. Femoral & Inguinal  Generalized Femoral & Inguinal Lymphatics: Bilateral - Description - Normal. Tenderness - Non Tender.    Assessment & Plan  ABNORMAL MAMMOGRAM OF RIGHT BREAST (R92.8)    your recent mammogram showed a 9 mm area of distortion in the right breast, slightly above and lateral to the nipple Biopsy shows what is called a complex sclerosing lesion There is a 4-9% chance that this will be upgraded to an early cancer if excised Most likely, therefore, you do not have  cancer  I recommended that this area be excised to be sure you do not have cancer You have a large hematoma at the biopsy site which will slowly go away We will plan to do the surgery and about 5 or 6 weeks to be sure that the blood clot has gone away We have discussed the indications, techniques, and risks of the surgery in detail.  ANTICOAGULATED (Z79.01) Impression: eliquis - Dr. Fletcher Anon cardiology HYPERTENSION, ESSENTIAL (I10) HISTORY OF CERVICAL SPINAL SURGERY (Z98.890) CHRONIC GERD (K21.9) RESTLESS LEG SYNDROME (G25.81) HYPERLIPIDEMIA, ACQUIRED (E78.5) HISTORY OF HYSTERECTOMY (Z90.710) Impression: lower midline incision    Edsel Petrin. Dalbert Batman, M.D., [facility] General and Minimally invasive Surgery Breast and Colorectal Surgery Office:   [phone] Pager:   [phone]

## 2016-11-04 ENCOUNTER — Ambulatory Visit
Admission: RE | Admit: 2016-11-04 | Discharge: 2016-11-04 | Disposition: A | Discharge status: Home / Self Care | Payer: Medicare Other | Source: Ambulatory Visit | Attending: General Surgery | Admitting: General Surgery

## 2016-11-04 DIAGNOSIS — R928 Other abnormal and inconclusive findings on diagnostic imaging of breast: Secondary | ICD-10-CM | POA: Diagnosis not present

## 2016-11-05 ENCOUNTER — Ambulatory Visit (HOSPITAL_BASED_OUTPATIENT_CLINIC_OR_DEPARTMENT_OTHER): Payer: Medicare Other | Admitting: Anesthesiology

## 2016-11-05 ENCOUNTER — Ambulatory Visit (HOSPITAL_BASED_OUTPATIENT_CLINIC_OR_DEPARTMENT_OTHER)
Admission: RE | Admit: 2016-11-05 | Discharge: 2016-11-05 | Disposition: A | Discharge status: Home / Self Care | Payer: Medicare Other | Source: Ambulatory Visit | Attending: General Surgery | Admitting: General Surgery

## 2016-11-05 ENCOUNTER — Encounter (HOSPITAL_BASED_OUTPATIENT_CLINIC_OR_DEPARTMENT_OTHER): Payer: Self-pay | Admitting: *Deleted

## 2016-11-05 ENCOUNTER — Ambulatory Visit
Admission: RE | Admit: 2016-11-05 | Discharge: 2016-11-05 | Disposition: A | Discharge status: Home / Self Care | Payer: Medicare Other | Source: Ambulatory Visit | Attending: General Surgery | Admitting: General Surgery

## 2016-11-05 ENCOUNTER — Encounter (HOSPITAL_BASED_OUTPATIENT_CLINIC_OR_DEPARTMENT_OTHER)
Admission: RE | Disposition: A | Discharge status: Home / Self Care | Payer: Self-pay | Source: Ambulatory Visit | Attending: General Surgery

## 2016-11-05 DIAGNOSIS — R011 Cardiac murmur, unspecified: Secondary | ICD-10-CM | POA: Insufficient documentation

## 2016-11-05 DIAGNOSIS — M1990 Unspecified osteoarthritis, unspecified site: Secondary | ICD-10-CM | POA: Insufficient documentation

## 2016-11-05 DIAGNOSIS — Z7901 Long term (current) use of anticoagulants: Secondary | ICD-10-CM | POA: Insufficient documentation

## 2016-11-05 DIAGNOSIS — Z800 Family history of malignant neoplasm of digestive organs: Secondary | ICD-10-CM | POA: Diagnosis not present

## 2016-11-05 DIAGNOSIS — D509 Iron deficiency anemia, unspecified: Secondary | ICD-10-CM | POA: Insufficient documentation

## 2016-11-05 DIAGNOSIS — Z79899 Other long term (current) drug therapy: Secondary | ICD-10-CM | POA: Diagnosis not present

## 2016-11-05 DIAGNOSIS — I4891 Unspecified atrial fibrillation: Secondary | ICD-10-CM | POA: Diagnosis not present

## 2016-11-05 DIAGNOSIS — E785 Hyperlipidemia, unspecified: Secondary | ICD-10-CM | POA: Diagnosis not present

## 2016-11-05 DIAGNOSIS — F329 Major depressive disorder, single episode, unspecified: Secondary | ICD-10-CM | POA: Insufficient documentation

## 2016-11-05 DIAGNOSIS — Z880 Allergy status to penicillin: Secondary | ICD-10-CM | POA: Insufficient documentation

## 2016-11-05 DIAGNOSIS — R928 Other abnormal and inconclusive findings on diagnostic imaging of breast: Secondary | ICD-10-CM

## 2016-11-05 DIAGNOSIS — I481 Persistent atrial fibrillation: Secondary | ICD-10-CM | POA: Diagnosis not present

## 2016-11-05 DIAGNOSIS — G2581 Restless legs syndrome: Secondary | ICD-10-CM | POA: Insufficient documentation

## 2016-11-05 DIAGNOSIS — Z8711 Personal history of peptic ulcer disease: Secondary | ICD-10-CM | POA: Diagnosis not present

## 2016-11-05 DIAGNOSIS — I340 Nonrheumatic mitral (valve) insufficiency: Secondary | ICD-10-CM | POA: Insufficient documentation

## 2016-11-05 DIAGNOSIS — N6021 Fibroadenosis of right breast: Secondary | ICD-10-CM | POA: Diagnosis not present

## 2016-11-05 DIAGNOSIS — Z87891 Personal history of nicotine dependence: Secondary | ICD-10-CM | POA: Diagnosis not present

## 2016-11-05 DIAGNOSIS — N6310 Unspecified lump in the right breast, unspecified quadrant: Secondary | ICD-10-CM

## 2016-11-05 DIAGNOSIS — K219 Gastro-esophageal reflux disease without esophagitis: Secondary | ICD-10-CM | POA: Insufficient documentation

## 2016-11-05 DIAGNOSIS — F419 Anxiety disorder, unspecified: Secondary | ICD-10-CM | POA: Insufficient documentation

## 2016-11-05 DIAGNOSIS — I10 Essential (primary) hypertension: Secondary | ICD-10-CM | POA: Diagnosis not present

## 2016-11-05 DIAGNOSIS — Z90710 Acquired absence of both cervix and uterus: Secondary | ICD-10-CM | POA: Insufficient documentation

## 2016-11-05 DIAGNOSIS — N6489 Other specified disorders of breast: Secondary | ICD-10-CM | POA: Diagnosis not present

## 2016-11-05 HISTORY — PX: BREAST LUMPECTOMY WITH RADIOACTIVE SEED LOCALIZATION: SHX6424

## 2016-11-05 SURGERY — BREAST LUMPECTOMY WITH RADIOACTIVE SEED LOCALIZATION
Anesthesia: General | Site: Breast | Laterality: Right

## 2016-11-05 MED ORDER — LIDOCAINE 2% (20 MG/ML) 5 ML SYRINGE
INTRAMUSCULAR | Status: DC | PRN
  Administered 2016-11-05: 60 mg via INTRAVENOUS

## 2016-11-05 MED ORDER — PHENYLEPHRINE HCL 10 MG/ML IJ SOLN
INTRAMUSCULAR | Status: AC
  Filled 2016-11-05: qty 1

## 2016-11-05 MED ORDER — ONDANSETRON HCL 4 MG/2ML IJ SOLN
INTRAMUSCULAR | Status: AC
  Filled 2016-11-05: qty 2

## 2016-11-05 MED ORDER — ACETAMINOPHEN 500 MG PO TABS
1000.0000 mg | ORAL_TABLET | ORAL | Status: AC
  Administered 2016-11-05: 1000 mg via ORAL

## 2016-11-05 MED ORDER — CEFAZOLIN SODIUM-DEXTROSE 2-4 GM/100ML-% IV SOLN
2.0000 g | INTRAVENOUS | Status: AC
  Administered 2016-11-05: 2 g via INTRAVENOUS

## 2016-11-05 MED ORDER — FENTANYL CITRATE (PF) 100 MCG/2ML IJ SOLN: 25.0000 ug | INTRAMUSCULAR | Status: DC | PRN

## 2016-11-05 MED ORDER — FENTANYL CITRATE (PF) 100 MCG/2ML IJ SOLN: 50.0000 ug | INTRAMUSCULAR | Status: DC | PRN

## 2016-11-05 MED ORDER — GABAPENTIN 300 MG PO CAPS
ORAL_CAPSULE | ORAL | Status: AC
  Filled 2016-11-05: qty 1

## 2016-11-05 MED ORDER — GABAPENTIN 300 MG PO CAPS
300.0000 mg | ORAL_CAPSULE | ORAL | Status: AC
  Administered 2016-11-05: 300 mg via ORAL

## 2016-11-05 MED ORDER — MIDAZOLAM HCL 2 MG/2ML IJ SOLN: 1.0000 mg | INTRAMUSCULAR | Status: DC | PRN

## 2016-11-05 MED ORDER — LACTATED RINGERS IV SOLN
INTRAVENOUS | Status: DC
  Administered 2016-11-05: 08:00:00 via INTRAVENOUS

## 2016-11-05 MED ORDER — DEXAMETHASONE SODIUM PHOSPHATE 4 MG/ML IJ SOLN
INTRAMUSCULAR | Status: DC | PRN
  Administered 2016-11-05: 5 mg via INTRAVENOUS

## 2016-11-05 MED ORDER — CHLORHEXIDINE GLUCONATE CLOTH 2 % EX PADS: 6.0000 | MEDICATED_PAD | Freq: Once | CUTANEOUS | Status: DC

## 2016-11-05 MED ORDER — CELECOXIB 200 MG PO CAPS
ORAL_CAPSULE | ORAL | Status: AC
  Filled 2016-11-05: qty 1

## 2016-11-05 MED ORDER — ONDANSETRON HCL 4 MG/2ML IJ SOLN
INTRAMUSCULAR | Status: DC | PRN
  Administered 2016-11-05: 4 mg via INTRAVENOUS

## 2016-11-05 MED ORDER — DEXAMETHASONE SODIUM PHOSPHATE 10 MG/ML IJ SOLN
INTRAMUSCULAR | Status: AC
  Filled 2016-11-05: qty 1

## 2016-11-05 MED ORDER — BUPIVACAINE-EPINEPHRINE (PF) 0.5% -1:200000 IJ SOLN
INTRAMUSCULAR | Status: DC | PRN
  Administered 2016-11-05: 10 mL

## 2016-11-05 MED ORDER — ACETAMINOPHEN 500 MG PO TABS
ORAL_TABLET | ORAL | Status: AC
  Filled 2016-11-05: qty 2

## 2016-11-05 MED ORDER — PHENYLEPHRINE HCL 10 MG/ML IJ SOLN
INTRAMUSCULAR | Status: DC | PRN
  Administered 2016-11-05: 40 ug via INTRAVENOUS

## 2016-11-05 MED ORDER — CEFAZOLIN SODIUM-DEXTROSE 2-4 GM/100ML-% IV SOLN
INTRAVENOUS | Status: AC
  Filled 2016-11-05: qty 100

## 2016-11-05 MED ORDER — LIDOCAINE 2% (20 MG/ML) 5 ML SYRINGE
INTRAMUSCULAR | Status: AC
  Filled 2016-11-05: qty 5

## 2016-11-05 MED ORDER — FENTANYL CITRATE (PF) 100 MCG/2ML IJ SOLN
INTRAMUSCULAR | Status: AC
  Filled 2016-11-05: qty 2

## 2016-11-05 MED ORDER — CELECOXIB 200 MG PO CAPS
200.0000 mg | ORAL_CAPSULE | ORAL | Status: AC
  Administered 2016-11-05: 200 mg via ORAL

## 2016-11-05 MED ORDER — SCOPOLAMINE 1 MG/3DAYS TD PT72: 1.0000 | MEDICATED_PATCH | Freq: Once | TRANSDERMAL | Status: DC | PRN

## 2016-11-05 MED ORDER — FENTANYL CITRATE (PF) 100 MCG/2ML IJ SOLN
INTRAMUSCULAR | Status: DC | PRN
  Administered 2016-11-05: 12.5 ug via INTRAVENOUS
  Administered 2016-11-05: 50 ug via INTRAVENOUS
  Administered 2016-11-05 (×3): 12.5 ug via INTRAVENOUS

## 2016-11-05 MED ORDER — PROPOFOL 10 MG/ML IV BOLUS
INTRAVENOUS | Status: AC
  Filled 2016-11-05: qty 20

## 2016-11-05 MED ORDER — PROPOFOL 10 MG/ML IV BOLUS
INTRAVENOUS | Status: DC | PRN
  Administered 2016-11-05: 70 mg via INTRAVENOUS

## 2016-11-05 SURGICAL SUPPLY — 60 items
ADH SKN CLS APL DERMABOND .7 (GAUZE/BANDAGES/DRESSINGS) ×1
APPLIER CLIP 9.375 MED OPEN (MISCELLANEOUS) ×3
APR CLP MED 9.3 20 MLT OPN (MISCELLANEOUS) ×1
BINDER BREAST LRG (GAUZE/BANDAGES/DRESSINGS) ×2 IMPLANT
BINDER BREAST MEDIUM (GAUZE/BANDAGES/DRESSINGS) IMPLANT
BLADE HEX COATED 2.75 (ELECTRODE) ×3 IMPLANT
BLADE SURG 10 STRL SS (BLADE) IMPLANT
BLADE SURG 15 STRL LF DISP TIS (BLADE) ×1 IMPLANT
BLADE SURG 15 STRL SS (BLADE) ×3
CANISTER SUC SOCK COL 7IN (MISCELLANEOUS) IMPLANT
CANISTER SUCT 1200ML W/VALVE (MISCELLANEOUS) ×3 IMPLANT
CHLORAPREP W/TINT 26ML (MISCELLANEOUS) ×3 IMPLANT
CLIP APPLIE 9.375 MED OPEN (MISCELLANEOUS) IMPLANT
CLOSURE WOUND 1/2 X4 (GAUZE/BANDAGES/DRESSINGS)
COVER BACK TABLE 60X90IN (DRAPES) ×3 IMPLANT
COVER MAYO STAND STRL (DRAPES) ×3 IMPLANT
COVER PROBE W GEL 5X96 (DRAPES) ×3 IMPLANT
DECANTER SPIKE VIAL GLASS SM (MISCELLANEOUS) IMPLANT
DERMABOND ADVANCED (GAUZE/BANDAGES/DRESSINGS) ×2
DERMABOND ADVANCED .7 DNX12 (GAUZE/BANDAGES/DRESSINGS) ×1 IMPLANT
DEVICE DUBIN W/COMP PLATE 8390 (MISCELLANEOUS) ×3 IMPLANT
DRAPE LAPAROSCOPIC ABDOMINAL (DRAPES) ×3 IMPLANT
DRAPE UTILITY XL STRL (DRAPES) ×3 IMPLANT
DRSG PAD ABDOMINAL 8X10 ST (GAUZE/BANDAGES/DRESSINGS) ×2 IMPLANT
ELECT REM PT RETURN 9FT ADLT (ELECTROSURGICAL) ×3
ELECTRODE REM PT RTRN 9FT ADLT (ELECTROSURGICAL) ×1 IMPLANT
GAUZE SPONGE 4X4 12PLY STRL LF (GAUZE/BANDAGES/DRESSINGS) IMPLANT
GLOVE BIO SURGEON STRL SZ7 (GLOVE) ×2 IMPLANT
GLOVE BIOGEL PI IND STRL 7.0 (GLOVE) IMPLANT
GLOVE BIOGEL PI INDICATOR 7.0 (GLOVE) ×2
GLOVE EUDERMIC 7 POWDERFREE (GLOVE) ×5 IMPLANT
GOWN STRL REUS W/ TWL LRG LVL3 (GOWN DISPOSABLE) ×1 IMPLANT
GOWN STRL REUS W/ TWL XL LVL3 (GOWN DISPOSABLE) ×1 IMPLANT
GOWN STRL REUS W/TWL LRG LVL3 (GOWN DISPOSABLE) ×3
GOWN STRL REUS W/TWL XL LVL3 (GOWN DISPOSABLE) ×3
ILLUMINATOR WAVEGUIDE N/F (MISCELLANEOUS) IMPLANT
KIT MARKER MARGIN INK (KITS) ×3 IMPLANT
LIGHT WAVEGUIDE WIDE FLAT (MISCELLANEOUS) IMPLANT
NDL HYPO 25X1 1.5 SAFETY (NEEDLE) ×1 IMPLANT
NEEDLE HYPO 25X1 1.5 SAFETY (NEEDLE) ×3 IMPLANT
NS IRRIG 1000ML POUR BTL (IV SOLUTION) ×3 IMPLANT
PACK BASIN DAY SURGERY FS (CUSTOM PROCEDURE TRAY) ×3 IMPLANT
PENCIL BUTTON HOLSTER BLD 10FT (ELECTRODE) ×3 IMPLANT
SLEEVE SCD COMPRESS KNEE MED (MISCELLANEOUS) ×3 IMPLANT
SPONGE LAP 4X18 X RAY DECT (DISPOSABLE) ×3 IMPLANT
STRIP CLOSURE SKIN 1/2X4 (GAUZE/BANDAGES/DRESSINGS) IMPLANT
SUT ETHILON 3 0 FSL (SUTURE) IMPLANT
SUT MNCRL AB 4-0 PS2 18 (SUTURE) ×3 IMPLANT
SUT SILK 2 0 SH (SUTURE) ×3 IMPLANT
SUT VIC AB 2-0 CT1 27 (SUTURE)
SUT VIC AB 2-0 CT1 TAPERPNT 27 (SUTURE) IMPLANT
SUT VIC AB 3-0 SH 27 (SUTURE)
SUT VIC AB 3-0 SH 27X BRD (SUTURE) IMPLANT
SUT VICRYL 3-0 CR8 SH (SUTURE) ×3 IMPLANT
SYR 10ML LL (SYRINGE) ×3 IMPLANT
TOWEL OR 17X24 6PK STRL BLUE (TOWEL DISPOSABLE) ×3 IMPLANT
TOWEL OR NON WOVEN STRL DISP B (DISPOSABLE) ×2 IMPLANT
TUBE CONNECTING 20'X1/4 (TUBING) ×1
TUBE CONNECTING 20X1/4 (TUBING) ×2 IMPLANT
YANKAUER SUCT BULB TIP NO VENT (SUCTIONS) ×3 IMPLANT

## 2016-11-05 NOTE — Anesthesia Postprocedure Evaluation (Signed)
Anesthesia Post Note  [name]  Procedure(s) Performed: Procedure(s) (LRB): RIGHT BREAST LUMPECTOMY WITH RADIOACTIVE SEED LOCALIZATION (Right)     Patient location during evaluation: PACU Anesthesia Type: General Level of consciousness: awake and alert Pain management: pain level controlled Vital Signs Assessment: post-procedure vital signs reviewed and stable Respiratory status: spontaneous breathing, nonlabored ventilation and respiratory function stable Cardiovascular status: blood pressure returned to baseline and stable Postop Assessment: no signs of nausea or vomiting Anesthetic complications: no    Last Vitals:  Vitals:   11/05/16 1000 11/05/16 1025  BP: 103/78 125/80  Pulse: (!) 38 92  Resp: 15 16  Temp:  (!) 36.4 C    Last Pain:  Vitals:   11/05/16 1025  TempSrc: Oral  PainSc:                  Taiten Brawn,W. EDMOND

## 2016-11-05 NOTE — Discharge Instructions (Signed)
[place] Office Phone Number [phone]  BREAST BIOPSY/ PARTIAL MASTECTOMY: POST OP INSTRUCTIONS  Always review your discharge instruction sheet given to you by the facility where your surgery was performed.  IF YOU HAVE DISABILITY OR FAMILY LEAVE FORMS, YOU MUST BRING THEM TO THE OFFICE FOR PROCESSING.  DO NOT GIVE THEM TO YOUR DOCTOR.  1. A prescription for pain medication may be given to you upon discharge.  Take your pain medication as prescribed, if needed.  If narcotic pain medicine is not needed, then you may take acetaminophen (Tylenol) or ibuprofen (Advil) as needed. 2. Take your usually prescribed medications unless otherwise directed 3. If you need a refill on your pain medication, please contact your pharmacy.  They will contact our office to request authorization.  Prescriptions will not be filled after 5pm or on week-ends. 4. You should eat very light the first 24 hours after surgery, such as soup, crackers, pudding, etc.  Resume your normal diet the day after surgery. 5. Most patients will experience some swelling and bruising in the breast.  Ice packs and a good support bra will help.  Swelling and bruising can take several days to resolve.  6. It is common to experience some constipation if taking pain medication after surgery.  Increasing fluid intake and taking a stool softener will usually help or prevent this problem from occurring.  A mild laxative (Milk of Magnesia or Miralax) should be taken according to package directions if there are no bowel movements after 48 hours. 7. Unless discharge instructions indicate otherwise, you may remove your bandages 24-48 hours after surgery, and you may shower at that time.  You may have steri-strips (small skin tapes) in place directly over the incision.  These strips should be left on the skin for 7-10 days.  If your surgeon used skin glue on the incision, you may shower in 24 hours.  The glue will flake off over the  next 2-3 weeks.  Any sutures or staples will be removed at the office during your follow-up visit. 8. ACTIVITIES:  You may resume regular daily activities (gradually increasing) beginning the next day.  Wearing a good support bra or sports bra minimizes pain and swelling.  You may have sexual intercourse when it is comfortable. a. You may drive when you no longer are taking prescription pain medication, you can comfortably wear a seatbelt, and you can safely maneuver your car and apply brakes. b. RETURN TO WORK:   N/A ______________________________________________________________________________________ 9. You should see your doctor in the office for a follow-up appointment approximately two weeks after your surgery.  Your doctors nurse will typically make your follow-up appointment when she calls you with your pathology report.  Expect your pathology report 2-3 business days after your surgery.  You may call to check if you do not hear from [place] after three days. 10. OTHER INSTRUCTIONS: _____________ take Tylenol every  6 hours to control pain __________________________________________________________________________ _____________________________________________________________________________________________________________________________________ _____________________________________________________________________________________________________________________________________ _____________________________________________________________________________________________________________________________________  WHEN TO CALL YOUR DOCTOR: 1. Fever over 101.0 2. Nausea and/or vomiting. 3. Extreme swelling or bruising. 4. Continued bleeding from incision. 5. Increased pain, redness, or drainage from the incision.  The clinic staff is available to answer your questions during regular business hours.  Please dont hesitate to call and ask to speak to one of the nurses for clinical concerns.  If you have a  medical emergency, go to the nearest emergency room or call 911.  A surgeon from [facility] is always on  call at the hospital.  For further questions, please visit [email or web address]      Post Anesthesia Home Care Instructions  Activity: Get plenty of rest for the remainder of the day. A responsible individual must stay with you for 24 hours following the procedure.  For the next 24 hours, DO NOT: -Drive a car -Paediatric nurse -Drink alcoholic beverages -Take any medication unless instructed by your physician -Make any legal decisions or sign important papers.  Meals: Start with liquid foods such as gelatin or soup. Progress to regular foods as tolerated. Avoid greasy, spicy, heavy foods. If nausea and/or vomiting occur, drink only clear liquids until the nausea and/or vomiting subsides. Call your physician if vomiting continues.  Special Instructions/Symptoms: Your throat may feel dry or sore from the anesthesia or the breathing tube placed in your throat during surgery. If this causes discomfort, gargle with warm salt water. The discomfort should disappear within 24 hours.  If you had a scopolamine patch placed behind your ear for the management of post- operative nausea and/or vomiting:  1. The medication in the patch is effective for 72 hours, after which it should be removed.  Wrap patch in a tissue and discard in the trash. Wash hands thoroughly with soap and water. 2. You may remove the patch earlier than 72 hours if you experience unpleasant side effects which may include dry mouth, dizziness or visual disturbances. 3. Avoid touching the patch. Wash your hands with soap and water after contact with the patch.

## 2016-11-05 NOTE — Op Note (Signed)
Patient Name:           [name]   Date of Surgery:        11/05/2016  Pre op Diagnosis:      Complex sclerosing lesion right breast upper outer quadrant  Post op Diagnosis:    Same  Procedure:                 Right breast lumpectomy with radioactive seed localization and margin assessment  Surgeon:                     Edsel Petrin. Dalbert Batman, M.D., FACS  Assistant:                      OR staff   Indication for Assistant: N/A  Operative Indications:this is a pleasant 83 year old female who lives in [place] but is followed by [facility]ology and [place] [facility] at [facility]. Dr. Danise Mina is her PCP. Dr. Fletcher Anon is her cardiologist. she is referred by Dr. Everlean Alstrom at the [facility] for evaluation of complex sclerosing lesion right breast upper outer quadrant.     She has no prior breast problems. Last mammogram was May 2014. Her PCP sent her for screening mammography and they found a 9 mm area mass and distortion in the right breast at the 10 o'clock position only 2 cm from the nipple. Image guided biopsy shows complex sclerosing  lesion with calcifications. She was referred for excision. She  developed a lot of swelling in the breast and had a good sized hematoma.      Past history is negative for breast problems. She has atrial fibrillation and takes Eliquis chronically. Hyperlipidemia. Hypertension. Takes iron for anemia. History cervical spine surgery. Restless leg syndrome. GERD. Family history is negative for breast or ovarian cancer. Sister had pancreatic cancer     I discussed the histology and implications of CSL. I quoted a 5-9% risk of upgrade to early cancer when completely excised. I told her she probably did not have cancer. I advised excision and told her I would wait 5 weeks or more to let the hematoma resolve. . She does not want to take any risk.  She agrees with this plan.  Eloquence has been held for 2 days at her cardiologist's  direction.   Operative Findings:       The original biopsy clip and radioactive seed were immediately adjacent to each other in the upper outer quadrant of the right breast.  The specimen mammogram looked very good containing the clip and the seed within the center of the specimen.  There was a lot of scarring from the old hematoma which had mostly resolved.  Procedure in Detail:          Following the induction of general LMA anesthesia the patient's right breast was prepped and draped in a sterile fashion.  Surgical timeout was performed.  Intravenous antibiotics were given.  0.5% Marcaine with epinephrine was used as local infiltration anesthetic.  Using the neoprobe to localize the radioactive signal I made a curvilinear incision in the upper outer quadrant about 2 cm lateral to the areolar margin.  Dissection was carried directly down into the breast tissue and around the radioactive seeds using the neoprobe and electrocautery.  The specimen was removed and marked with silk sutures and a 6 color ink kit to orient the pathologist.  The specimen mammogram looked very good as described above.  Hemostasis was excellent  and achieved electrocautery.  The wound was irrigated.  The lumpectomy cavity walls were marked with 5 metallic clips according to our protocol.  The breast tissues were reapproximated with interrupted sutures of 3-0 Vicryl and the skin closed with a running subcuticular 4-0 Monocryl and Dermabond.  Dry bandages and a breast binder were placed.  The patient tolerated the procedure well was taken to PACU in stable condition.  EBL less than 10 mL.  Counts correct.  Complications none.     Edsel Petrin. Dalbert Batman, M.D., FACS General and Minimally Invasive Surgery Breast and Colorectal Surgery   Addendum: I like onto the [facility] website website and reviewed her medication history   11/05/2016 9:31 AM

## 2016-11-05 NOTE — Anesthesia Procedure Notes (Signed)
Procedure Name: LMA Insertion Date/Time: 11/05/2016 8:43 AM Performed by: Justice Rocher Pre-anesthesia Checklist: Patient identified, Emergency Drugs available, Suction available and Patient being monitored Patient Re-evaluated:Patient Re-evaluated prior to induction Oxygen Delivery Method: Circle system utilized Preoxygenation: Pre-oxygenation with 100% oxygen Induction Type: IV induction Ventilation: Mask ventilation without difficulty LMA: LMA inserted LMA Size: 4.0 Number of attempts: 1 Airway Equipment and Method: Bite block Placement Confirmation: positive ETCO2 and breath sounds checked- equal and bilateral Tube secured with: Tape Dental Injury: Teeth and Oropharynx as per pre-operative assessment

## 2016-11-05 NOTE — Transfer of Care (Signed)
Immediate Anesthesia Transfer of Care Note  [name]  Procedure(s) Performed: Procedure(s) (LRB): RIGHT BREAST LUMPECTOMY WITH RADIOACTIVE SEED LOCALIZATION (Right)  Patient Location: PACU  Anesthesia Type: General  Level of Consciousness: awake, sedated, patient cooperative and responds to stimulation  Airway & Oxygen Therapy: Patient Spontanous Breathing and Patient connected to face mask oxygen  Post-op Assessment: Report given to PACU RN, Post -op Vital signs reviewed and stable and Patient moving all extremities  Post vital signs: Reviewed and stable  Complications: No apparent anesthesia complications

## 2016-11-05 NOTE — Interval H&P Note (Signed)
History and Physical Interval Note:  11/05/2016 8:20 AM  [name]  has presented today for surgery, with the diagnosis of abnormal mammogram right breast  The various methods of treatment have been discussed with the patient and family. After consideration of risks, benefits and other options for treatment, the patient has consented to  Procedure(s): RIGHT BREAST LUMPECTOMY WITH RADIOACTIVE SEED LOCALIZATION (Right) as a surgical intervention .  The patient's history has been reviewed, patient examined, no change in status, stable for surgery.  I have reviewed the patient's chart and labs.  Questions were answered to the patient's satisfaction.     [name]

## 2016-11-05 NOTE — Anesthesia Preprocedure Evaluation (Addendum)
Anesthesia Evaluation  Patient identified by MRN, date of birth, ID band Patient awake    Reviewed: Allergy & Precautions, H&P , NPO status , Patient's Chart, lab work & pertinent test results, reviewed documented beta blocker date and time   Airway Mallampati: III  TM Distance: >3 FB Neck ROM: Full    Dental no notable dental hx. (+) Upper Dentures, Lower Dentures, Dental Advisory Given   Pulmonary neg pulmonary ROS, former smoker,    Pulmonary exam normal breath sounds clear to auscultation       Cardiovascular hypertension, Pt. on medications and Pt. on home beta blockers + dysrhythmias Atrial Fibrillation + Valvular Problems/Murmurs MR  Rhythm:Irregular Rate:Normal     Neuro/Psych Anxiety Depression negative neurological ROS     GI/Hepatic Neg liver ROS, PUD, GERD  Medicated and Controlled,  Endo/Other  negative endocrine ROS  Renal/GU negative Renal ROS  negative genitourinary   Musculoskeletal  (+) Arthritis , Osteoarthritis,    Abdominal   Peds  Hematology negative hematology ROS (+)   Anesthesia Other Findings   Reproductive/Obstetrics negative OB ROS                            Anesthesia Physical Anesthesia Plan  ASA: III  Anesthesia Plan: General   Post-op Pain Management:    Induction: Intravenous  PONV Risk Score and Plan: 4 or greater and Ondansetron, Dexamethasone and Treatment may vary due to age or medical condition  Airway Management Planned: LMA  Additional Equipment:   Intra-op Plan:   Post-operative Plan: Extubation in OR  Informed Consent: I have reviewed the patients History and Physical, chart, labs and discussed the procedure including the risks, benefits and alternatives for the proposed anesthesia with the patient or authorized representative who has indicated his/her understanding and acceptance.   Dental advisory given  Plan Discussed with:  CRNA  Anesthesia Plan Comments:         Anesthesia Quick Evaluation

## 2016-11-06 ENCOUNTER — Encounter: Payer: Self-pay | Admitting: Family Medicine

## 2016-11-07 ENCOUNTER — Telehealth: Payer: Self-pay | Admitting: Cardiovascular Disease

## 2016-11-07 NOTE — Progress Notes (Signed)
Inform patient of Pathology report,.breast lumpectomy report shows benign complex sclerosing lesion.  No cancer. Please let me know that you reached her.  hmi

## 2016-11-07 NOTE — Telephone Encounter (Signed)
S/w patient. She feels like her shortness of breath has increased over the past month since her last OV on 10/07/16. She feels it mainly when she lays down at night and when doing activity such as walking to the mailbox where she gets out of breath. She reports being much more tired than usual. Denies palpitations or chest pain. Occasional dizziness upon standing but knows to take it slowly. Recent BP's are 124/87, HR 92;  128/81, HR 100;  117/79, HR 92. She had an appt scheduled to see Dr Fletcher Anon on 11/21/16 and would like to be seen sooner. Patient rescheduled to see Dr Fletcher Anon on 11/12/16. Patient was very appreciative and will call back if symptoms worsen between now and appt. Routing to [employer], PA-C for further advice if needed.

## 2016-11-07 NOTE — Telephone Encounter (Signed)
Pt calling stating on Tuesday she had breast surgery, she's doing well from that stand point  She is calling since then she's been more SOB  States last time she was here she was told she was in Afib and was due back about 3 weeks but the surgery came up so she pushed out her appointment   She is concerned about this Please advise.

## 2016-11-07 NOTE — Telephone Encounter (Signed)
No answer on home number. Left message to call back.  Cell phone number gave busy signal.

## 2016-11-11 ENCOUNTER — Ambulatory Visit (INDEPENDENT_AMBULATORY_CARE_PROVIDER_SITE_OTHER): Payer: Medicare Other | Admitting: Family Medicine

## 2016-11-11 ENCOUNTER — Encounter: Payer: Self-pay | Admitting: Family Medicine

## 2016-11-11 VITALS — BP 116/72 | HR 92 | Ht 62.0 in | Wt 142.1 lb

## 2016-11-11 DIAGNOSIS — M159 Polyosteoarthritis, unspecified: Secondary | ICD-10-CM

## 2016-11-11 DIAGNOSIS — G8929 Other chronic pain: Secondary | ICD-10-CM | POA: Diagnosis not present

## 2016-11-11 DIAGNOSIS — M150 Primary generalized (osteo)arthritis: Secondary | ICD-10-CM | POA: Diagnosis not present

## 2016-11-11 DIAGNOSIS — M5136 Other intervertebral disc degeneration, lumbar region: Secondary | ICD-10-CM | POA: Diagnosis not present

## 2016-11-11 MED ORDER — GABAPENTIN 100 MG PO CAPS: 200.0000 mg | ORAL_CAPSULE | Freq: Two times a day (BID) | ORAL | 3 refills | Status: DC

## 2016-11-11 MED ORDER — TAPENTADOL HCL 50 MG PO TABS: 25.0000 mg | ORAL_TABLET | Freq: Two times a day (BID) | ORAL | 0 refills | Status: DC | PRN

## 2016-11-11 NOTE — Patient Instructions (Signed)
Try nucynta 1/2 tablet twice daily as needed for pain after tylenol. Increase gabapentin to 200mg  twice daily.  Return in 1 month for follow up visit.  Let us know if we run out of medicine sooner.

## 2016-11-11 NOTE — Progress Notes (Signed)
BP 116/72 (BP Location: Left Arm, Patient Position: Sitting, Cuff Size: Normal)   Pulse 92   Ht 5\' 2"  (1.575 m)   Wt 142 lb 1.9 oz (64.5 kg)   SpO2 97%   BMI 25.99 kg/m    CC: lower back pain Subjective:    Patient ID: [name], female    DOB: [birth year], 83 y.o.   MRN: [id number]  HPI: [name] is a 83 y.o. female presenting on 11/11/2016 for Back Pain (lower back, pt states PT did not help, would like to discuss to discuss a pain specalist )   Chronic lumbar back pain with radiation to bilateral buttock/groin.  Saw Dr Sharlet Salina with multifactorial pain thought due to spinal stenosis, disc herniation, lumbar radiculitis, s/p epidural steroid injections with mild temporary relief (1 wk at a time) (01/2016, 02/2016, 04/2016). Latest MRI 12/2015.  Has tried tylenol, prednisone taper, tizanidine, gabapentin, tramadol, and norco (mostly no relief or intolerances).  Avoids NSAIDs (eliquis, renal insuff) Uses lumbar support brace without any improvement.  PT worsened pain. She tries to follow home exercise program.  Pain relieved with sitting in recliner or using heating pad.  She also has tried topical treatments.  Last tried nucynta which seemed to help and didn't seem to have too many side effects.   Activity limiting pain - no longer able to walk her dog.  Afib on Eliquis.  Recently had breast lumpectomy for complex sclerosing lesion with calcifications R breast - pathology showed no cancer. She recovered wonderfully from this!   Relevant past medical, surgical, family and social history reviewed and updated as indicated. Interim medical history since our last visit reviewed. Allergies and medications reviewed and updated. Outpatient Medications Prior to Visit  Medication Sig Dispense Refill  . acetaminophen (TYLENOL) 500 MG tablet Take 500 mg by mouth every 6 (six) hours as needed for moderate pain.     Marland Kitchen acyclovir (ZOVIRAX) 400 MG tablet Take 400 mg by mouth daily.       Marland Kitchen apixaban (ELIQUIS) 5 MG TABS tablet Take 1 tablet (5 mg total) by mouth 2 (two) times daily. 60 tablet 6  . atorvastatin (LIPITOR) 20 MG tablet TAKE ONE TABLET EVERY DAY 90 tablet 1  . calcium-vitamin D (OSCAL WITH D) 500-200 MG-UNIT per tablet Take 1 tablet by mouth daily.      . Cholecalciferol (VITAMIN D3) 1000 units CAPS Take 1 capsule (1,000 Units total) by mouth daily. 30 capsule   . ferrous sulfate 325 (65 FE) MG tablet Take 1 tablet (325 mg total) by mouth daily with breakfast.    . hydrochlorothiazide (MICROZIDE) 12.5 MG capsule TAKE 1 CAPSULE EVERY DAY 30 capsule 9  . metoprolol tartrate (LOPRESSOR) 25 MG tablet TAKE ONE TABLET TWICE DAILY 60 tablet 2  . metoprolol tartrate (LOPRESSOR) 50 MG tablet Take 1.5 tablets (75 mg total) by mouth 2 (two) times daily. 180 tablet 3  . pantoprazole (PROTONIX) 40 MG tablet TAKE ONE TABLET EVERY DAY 30 tablet 6  . prednisoLONE acetate (PRED FORTE) 1 % ophthalmic suspension Place 1 drop into the left eye daily.     Marland Kitchen rOPINIRole (REQUIP) 1 MG tablet TAKE TWO TABLETS AT BEDTIME 60 tablet 6  . tiZANidine (ZANAFLEX) 4 MG capsule Take 4 mg by mouth 2 (two) times daily.     Marland Kitchen venlafaxine (EFFEXOR) 37.5 MG tablet TAKE ONE TABLET TWICE DAILY WITH A MEAL 60 tablet 9  . vitamin B-12 (CYANOCOBALAMIN) 1000 MCG tablet Take 1,000 mcg  by mouth daily.    Marland Kitchen gabapentin (NEURONTIN) 100 MG capsule Take 1 daily for 4 days then increase to twice daily for 4 days then take TID 90 capsule 3  . traMADol (ULTRAM) 50 MG tablet TAKE 1/2 TO 1 TABLET BY MOUTH TWICE DAILY AS NEEDED     No facility-administered medications prior to visit.      Per HPI unless specifically indicated in ROS section below Review of Systems     Objective:    BP 116/72 (BP Location: Left Arm, Patient Position: Sitting, Cuff Size: Normal)   Pulse 92   Ht 5\' 2"  (1.575 m)   Wt 142 lb 1.9 oz (64.5 kg)   SpO2 97%   BMI 25.99 kg/m   Wt Readings from Last 3 Encounters:  11/11/16 142 lb 1.9 oz  (64.5 kg)  11/05/16 138 lb (62.6 kg)  10/07/16 138 lb 12 oz (62.9 kg)   Physical Exam  Constitutional: She appears well-developed and well-nourished. No distress.  Musculoskeletal: She exhibits no edema.  Neurological:  Stiff antalgic gait  Skin: Skin is warm and dry. No rash noted.  Psychiatric: She has a normal mood and affect.  Nursing note and vitals reviewed.  Results for orders placed or performed during the hospital encounter of 11/05/16  CBC WITH DIFFERENTIAL  Result Value Ref Range   WBC 7.4 4.0 - 10.5 K/uL   RBC 4.44 3.87 - 5.11 MIL/uL   Hemoglobin 13.5 12.0 - 15.0 g/dL   HCT 40.1 36.0 - 46.0 %   MCV 90.3 78.0 - 100.0 fL   MCH 30.4 26.0 - 34.0 pg   MCHC 33.7 30.0 - 36.0 g/dL   RDW 13.1 11.5 - 15.5 %   Platelets 247 150 - 400 K/uL   Neutrophils Relative % 67 %   Neutro Abs 5.0 1.7 - 7.7 K/uL   Lymphocytes Relative 22 %   Lymphs Abs 1.7 0.7 - 4.0 K/uL   Monocytes Relative 8 %   Monocytes Absolute 0.6 0.1 - 1.0 K/uL   Eosinophils Relative 2 %   Eosinophils Absolute 0.2 0.0 - 0.7 K/uL   Basophils Relative 1 %   Basophils Absolute 0.1 0.0 - 0.1 K/uL  Comprehensive metabolic panel  Result Value Ref Range   Sodium 139 135 - 145 mmol/L   Potassium 4.2 3.5 - 5.1 mmol/L   Chloride 105 101 - 111 mmol/L   CO2 24 22 - 32 mmol/L   Glucose, Bld 105 (H) 65 - 99 mg/dL   BUN 11 6 - 20 mg/dL   Creatinine, Ser 0.86 0.44 - 1.00 mg/dL   Calcium 9.4 8.9 - 10.3 mg/dL   Total Protein 6.8 6.5 - 8.1 g/dL   Albumin 4.1 3.5 - 5.0 g/dL   AST 27 15 - 41 U/L   ALT 22 14 - 54 U/L   Alkaline Phosphatase 71 38 - 126 U/L   Total Bilirubin 0.7 0.3 - 1.2 mg/dL   GFR calc non Af Amer 60 (L) >60 mL/min   GFR calc Af Amer >60 >60 mL/min   Anion gap 10 5 - 15      Assessment & Plan:   Problem List Items Addressed This Visit    DDD (degenerative disc disease), lumbar - Primary    Chronic lower back pain ongoing for over a year failed ESI, unclear if surgical candidate (has been recommended  against this). No ongoing relief with PMR. Discussed options - desires trial management here - see above.  Relevant Medications   tapentadol (NUCYNTA) 50 MG tablet   Encounter for chronic pain management    Discussed pain regimen and different options. Pt states nucynta was effective. Will trial this for the next month, if doing well, will establish pain management at our clinic. If not effective, will refer to pain management clinic. Will also increase gabapentin to 200mg  bid. Consider lidocaine patches. Pt agrees with plan.       Osteoarthritis   Relevant Medications   tapentadol (NUCYNTA) 50 MG tablet       Follow up plan: Return in about 4 weeks (around 12/09/2016) for follow up visit.  Ria Bush, MD

## 2016-11-11 NOTE — Assessment & Plan Note (Signed)
Chronic lower back pain ongoing for over a year failed ESI, unclear if surgical candidate (has been recommended against this). No ongoing relief with PMR. Discussed options - desires trial management here - see above.

## 2016-11-11 NOTE — Assessment & Plan Note (Signed)
Discussed pain regimen and different options. Pt states nucynta was effective. Will trial this for the next month, if doing well, will establish pain management at our clinic. If not effective, will refer to pain management clinic. Will also increase gabapentin to 200mg  bid. Consider lidocaine patches. Pt agrees with plan.

## 2016-11-12 ENCOUNTER — Ambulatory Visit (INDEPENDENT_AMBULATORY_CARE_PROVIDER_SITE_OTHER): Payer: Medicare Other | Admitting: Cardiovascular Disease

## 2016-11-12 ENCOUNTER — Encounter: Payer: Self-pay | Admitting: Cardiovascular Disease

## 2016-11-12 VITALS — BP 138/80 | HR 99 | Ht 62.0 in | Wt 142.8 lb

## 2016-11-12 DIAGNOSIS — E785 Hyperlipidemia, unspecified: Secondary | ICD-10-CM

## 2016-11-12 DIAGNOSIS — R0602 Shortness of breath: Secondary | ICD-10-CM

## 2016-11-12 DIAGNOSIS — I4819 Other persistent atrial fibrillation: Secondary | ICD-10-CM

## 2016-11-12 DIAGNOSIS — I10 Essential (primary) hypertension: Secondary | ICD-10-CM | POA: Diagnosis not present

## 2016-11-12 DIAGNOSIS — I481 Persistent atrial fibrillation: Secondary | ICD-10-CM | POA: Diagnosis not present

## 2016-11-12 NOTE — Progress Notes (Signed)
Cardiology Office Note   Date:  11/12/2016   ID:  [name], DOB Jan 29, 1930, MRN [id number]  PCP:  Ria Bush, MD  Cardiologist:   [name], MD   Chief Complaint  Patient presents with  . OTHER    No complaints today. Meds reviewed verbally with pt.      History of Present Illness: [name] is a 83 y.o. female who presents for a follow-up visit regarding paroxysmal atrial fibrillation. Nuclear stress test in 03/2012 showed no evidence of ischemia. Echocardiogram in 2013 showed normal LV systolic function, mild to moderate aortic and mitral regurgitation, moderate tricuspid regurgitation with mild pulmonary hypertension.   During last visit she was noted to be in atrial fibrillation with rapid ventricular response in the setting of increased lower back pain. I discontinued diltiazem and increased metoprolol. She underwent recent lumpectomy for right breast mass which turned out to be benign. She is relieved. She reports no chest pain. Palpitations improved but she continues to have shortness of breath. Biggest issue continues to be lower back pain which is quite severe.    Past Medical History:  Diagnosis Date  . Abdominal aortic atherosclerosis ([place]) 09/2015   by xray  . Anxiety   . BCC (basal cell carcinoma of skin) 05/2011   on nose  . Breast lesion    a. diagnosed with complex sclerosing lesion with calcifications of the right breast in 08/2016  . Breast mass, right   . Chest pain    a. nuclear stress test 03/2012: normal  . DDD (degenerative disc disease), lumbosacral 12/06/1992  . Dysrhythmia    a-fib  . Gallstones 09/2015   incidentally by xray  . GERD (gastroesophageal reflux disease) 05/1999  . History of shingles    ophthalmic, takes acyclovir daily  . Hyperlipidemia 12/2003  . Hypertension 1990  . Mitral regurgitation    a. echo 03/2012: EF 60-65%, no RWMA, mild to mod AI/MR, mod TR, PASP 37 mmHg  . Osteoarthritis 08/21/1989  .  Osteoporosis with fracture 11/1997   with compression fracture T12  . PAF (paroxysmal atrial fibrillation) ([place]) 03/16/2012   a. on Eliquis; b. CHADS2VASc => 5 (HTN, age x 2, vascular disease, sex category)  . Rheumatoid arthritis ([place])   . RLS (restless legs syndrome)     Past Surgical History:  Procedure Laterality Date  . ABDOMINAL HYSTERECTOMY  Age 59 - 43   S/P TAH  . abdominal ultrasound  04/23/2004   Gallstones  . BREAST LUMPECTOMY WITH RADIOACTIVE SEED LOCALIZATION Right 10/2016   breast lumpectomy with radioactive seed localization and margin assessment for complex sclerosing lesion [facility])  . BREAST LUMPECTOMY WITH RADIOACTIVE SEED LOCALIZATION Right 11/05/2016   Procedure: RIGHT BREAST LUMPECTOMY WITH RADIOACTIVE SEED LOCALIZATION;  Surgeon: Fanny Skates, MD;  Location: [facility];  Service: General;  Laterality: Right;  . CATARACT EXTRACTION  05/2010   left eye  . CERVICAL DISCECTOMY  1992   Fusion (Dr. Joya Salm)  . ESI Bilateral 01/2016   S1 transforaminal ESI x3  . ESOPHAGOGASTRODUODENOSCOPY  01/01/2002   with ulcer, bx. neg; + stricture gastric ulcer with hemorrhage  . ESOPHAGOGASTRODUODENOSCOPY  04/23/2004   Esophageal stricture -- dilated  . nuclear stress test  03/2012   no ischemia  . SKIN CANCER EXCISION  05/20/2011   BCC from nose  . US ECHOCARDIOGRAPHY  03/2012   in [place], EF 60%, mild-mod aortic, mitral, tricuspid regurg, mildly dilated LA     Current Outpatient  Prescriptions  Medication Sig Dispense Refill  . acetaminophen (TYLENOL) 500 MG tablet Take 500 mg by mouth every 6 (six) hours as needed for moderate pain.     Marland Kitchen acyclovir (ZOVIRAX) 400 MG tablet Take 400 mg by mouth daily.      Marland Kitchen apixaban (ELIQUIS) 5 MG TABS tablet Take 1 tablet (5 mg total) by mouth 2 (two) times daily. 60 tablet 6  . atorvastatin (LIPITOR) 20 MG tablet TAKE ONE TABLET EVERY DAY 90 tablet 1  . calcium-vitamin D (OSCAL WITH D) 500-200 MG-UNIT per tablet  Take 1 tablet by mouth daily.      . Cholecalciferol (VITAMIN D3) 1000 units CAPS Take 1 capsule (1,000 Units total) by mouth daily. 30 capsule   . ferrous sulfate 325 (65 FE) MG tablet Take 1 tablet (325 mg total) by mouth daily with breakfast.    . gabapentin (NEURONTIN) 100 MG capsule Take 2 capsules (200 mg total) by mouth 2 (two) times daily. 120 capsule 3  . hydrochlorothiazide (MICROZIDE) 12.5 MG capsule TAKE 1 CAPSULE EVERY DAY 30 capsule 9  . metoprolol tartrate (LOPRESSOR) 25 MG tablet TAKE ONE TABLET TWICE DAILY 60 tablet 2  . metoprolol tartrate (LOPRESSOR) 50 MG tablet Take 1.5 tablets (75 mg total) by mouth 2 (two) times daily. 180 tablet 3  . pantoprazole (PROTONIX) 40 MG tablet TAKE ONE TABLET EVERY DAY 30 tablet 6  . prednisoLONE acetate (PRED FORTE) 1 % ophthalmic suspension Place 1 drop into the left eye daily.     Marland Kitchen rOPINIRole (REQUIP) 1 MG tablet TAKE TWO TABLETS AT BEDTIME 60 tablet 6  . tapentadol (NUCYNTA) 50 MG tablet Take 0.5-1 tablets (25-50 mg total) by mouth 2 (two) times daily as needed. 30 tablet 0  . tiZANidine (ZANAFLEX) 4 MG capsule Take 4 mg by mouth 2 (two) times daily.     Marland Kitchen venlafaxine (EFFEXOR) 37.5 MG tablet TAKE ONE TABLET TWICE DAILY WITH A MEAL 60 tablet 9  . vitamin B-12 (CYANOCOBALAMIN) 1000 MCG tablet Take 1,000 mcg by mouth daily.     No current facility-administered medications for this visit.     Allergies:   Penicillins    Social History:  The patient  reports that she quit smoking about 40 years ago. She has a 0.25 pack-year smoking history. She has never used smokeless tobacco. She reports that she drinks about 1.2 oz of alcohol per week . She reports that she does not use drugs.   Family History:  The patient's family history includes Cancer in her brother; Diabetes in her brother, brother, brother, and sister; Emphysema in her mother; Heart disease in her brother and father; Hypertension in her brother; Stroke in her brother, father, and  sister.    ROS:  Please see the history of present illness.   Otherwise, review of systems are positive for none.   All other systems are reviewed and negative.    PHYSICAL EXAM: VS:  BP 138/80 (BP Location: Left Arm, Patient Position: Sitting, Cuff Size: Normal)   Pulse 99   Ht 5\' 2"  (1.575 m)   Wt 142 lb 12 oz (64.8 kg)   BMI 26.11 kg/m  , BMI Body mass index is 26.11 kg/m. GEN: Well nourished, well developed, in no acute distress  HEENT: normal  Neck: no JVD, carotid bruits, or masses Cardiac: Irregularly irregular and mildly tachycardic; no murmurs, rubs, or gallops,no edema  Respiratory:  clear to auscultation bilaterally, normal work of breathing GI: soft, nontender, nondistended, + BS  MS: no deformity or atrophy  Skin: warm and dry, no rash Neuro:  Strength and sensation are intact Psych: euthymic mood, full affect Normal distal pulses. Femoral pulses normal bilaterally   EKG:  EKG is ordered today. The ekg ordered today demonstrates  atrial fibrillation with ventricular rate of 87 bpm. Nonspecific ST and T wave changes.   Recent Labs: 10/31/2016: ALT 22; BUN 11; Creatinine, Ser 0.86; Hemoglobin 13.5; Platelets 247; Potassium 4.2; Sodium 139    Lipid Panel    Component Value Date/Time   CHOL 195 05/28/2016 1253   TRIG 107.0 05/28/2016 1253   HDL 53.70 05/28/2016 1253   CHOLHDL 4 05/28/2016 1253   VLDL 21.4 05/28/2016 1253   LDLCALC 120 (H) 05/28/2016 1253   LDLDIRECT 173.1 02/19/2010 0908      Wt Readings from Last 3 Encounters:  11/12/16 142 lb 12 oz (64.8 kg)  11/11/16 142 lb 1.9 oz (64.5 kg)  11/05/16 138 lb (62.6 kg)       No flowsheet data found.    ASSESSMENT AND PLAN:  1. Persistental fibrillation:  she continues to be in atrial fibrillation with ventricular rate is more controlled on current dose of metoprolol. She does complain of worsening dyspnea which could be due to atrial fibrillation . I discussed with her the option of proceeding with  cardioversion but she prefers to keep this as a last resort. I'm going to check an echocardiogram to evaluate LV systolic function and atrial size.    2. Essential hypertension: Blood pressure is controlled on current medications.  3. Hyperlipidemia: Currently on atorvastatin 20 mg once daily with most recent LDL of 75.    Disposition:   FU with me in 2  months  Signed,  [name], MD  11/12/2016 4:31 PM    [facility]

## 2016-11-12 NOTE — Patient Instructions (Addendum)
Medication Instructions:  Your physician recommends that you continue on your current medications as directed. Please refer to the Current Medication list given to you today.   Labwork: none  Testing/Procedures: Your physician has requested that you have an echocardiogram. Echocardiography is a painless test that uses sound waves to create images of your heart. It provides your doctor with information about the size and shape of your heart and how well your heart's chambers and valves are working. This procedure takes approximately one hour. There are no restrictions for this procedure.    Follow-Up: Your physician recommends that you schedule a follow-up appointment in: 2 months with Dr. Fletcher Anon.    Any Other Special Instructions Will Be Listed Below (If Applicable).     If you need a refill on your cardiac medications before your next appointment, please call your pharmacy.  Echocardiogram An echocardiogram, or echocardiography, uses sound waves (ultrasound) to produce an image of your heart. The echocardiogram is simple, painless, obtained within a short period of time, and offers valuable information to your health care provider. The images from an echocardiogram can provide information such as:  Evidence of coronary artery disease (CAD).  Heart size.  Heart muscle function.  Heart valve function.  Aneurysm detection.  Evidence of a past heart attack.  Fluid buildup around the heart.  Heart muscle thickening.  Assess heart valve function.  Tell a health care provider about:  Any allergies you have.  All medicines you are taking, including vitamins, herbs, eye drops, creams, and over-the-counter medicines.  Any problems you or family members have had with anesthetic medicines.  Any blood disorders you have.  Any surgeries you have had.  Any medical conditions you have.  Whether you are pregnant or may be pregnant. What happens before the procedure? No  special preparation is needed. Eat and drink normally. What happens during the procedure?  In order to produce an image of your heart, gel will be applied to your chest and a wand-like tool (transducer) will be moved over your chest. The gel will help transmit the sound waves from the transducer. The sound waves will harmlessly bounce off your heart to allow the heart images to be captured in real-time motion. These images will then be recorded.  You may need an IV to receive a medicine that improves the quality of the pictures. What happens after the procedure? You may return to your normal schedule including diet, activities, and medicines, unless your health care provider tells you otherwise. This information is not intended to replace advice given to you by your health care provider. Make sure you discuss any questions you have with your health care provider. Document Released: 03/22/2000 Document Revised: 11/11/2015 Document Reviewed: 11/30/2012 Elsevier Interactive Patient Education  2017 [employer].  Atrial Fibrillation Atrial fibrillation is a type of heartbeat that is irregular or fast (rapid). If you have this condition, your heart keeps quivering in a weird (chaotic) way. This condition can make it so your heart cannot pump blood normally. Having this condition gives a person more risk for stroke, heart failure, and other heart problems. There are different types of atrial fibrillation. Talk with your doctor to learn about the type that you have. Follow these instructions at home:  Take over-the-counter and prescription medicines only as told by your doctor.  If your doctor prescribed a blood-thinning medicine, take it exactly as told. Taking too much of it can cause bleeding. If you do not take enough of it, you  will not have the protection that you need against stroke and other problems.  Do not use any tobacco products. These include cigarettes, chewing tobacco, and e-cigarettes.  If you need help quitting, ask your doctor.  If you have apnea (obstructive sleep apnea), manage it as told by your doctor.  Do not drink alcohol.  Do not drink beverages that have caffeine. These include coffee, soda, and tea.  Maintain a healthy weight. Do not use diet pills unless your doctor says they are safe for you. Diet pills may make heart problems worse.  Follow diet instructions as told by your doctor.  Exercise regularly as told by your doctor.  Keep all follow-up visits as told by your doctor. This is important. Contact a doctor if:  You notice a change in the speed, rhythm, or strength of your heartbeat.  You are taking a blood-thinning medicine and you notice more bruising.  You get tired more easily when you move or exercise. Get help right away if:  You have pain in your chest or your belly (abdomen).  You have sweating or weakness.  You feel sick to your stomach (nauseous).  You notice blood in your throw up (vomit), poop (stool), or pee (urine).  You are short of breath.  You suddenly have swollen feet and ankles.  You feel dizzy.  Your suddenly get weak or numb in your face, arms, or legs, especially if it happens on one side of your body.  You have trouble talking, trouble understanding, or both.  Your face or your eyelid droops on one side. These symptoms may be an emergency. Do not wait to see if the symptoms will go away. Get medical help right away. Call your local emergency services (911 in the [place] Do not drive yourself to the hospital. This information is not intended to replace advice given to you by your health care provider. Make sure you discuss any questions you have with your health care provider. Document Released: 01/02/2008 Document Revised: 08/31/2015 Document Reviewed: 07/20/2014 Elsevier Interactive Patient Education  2018 [employer]. Atrial Fibrillation Atrial fibrillation is a type of irregular or rapid heartbeat  (arrhythmia). In atrial fibrillation, the heart quivers continuously in a chaotic pattern. This occurs when parts of the heart receive disorganized signals that make the heart unable to pump blood normally. This can increase the risk for stroke, heart failure, and other heart-related conditions. There are different types of atrial fibrillation, including:  Paroxysmal atrial fibrillation. This type starts suddenly, and it usually stops on its own shortly after it starts.  Persistent atrial fibrillation. This type often lasts longer than a week. It may stop on its own or with treatment.  Long-lasting persistent atrial fibrillation. This type lasts longer than 12 months.  Permanent atrial fibrillation. This type does not go away.  Talk with your health care provider to learn about the type of atrial fibrillation that you have. What are the causes? This condition is caused by some heart-related conditions or procedures, including:  A heart attack.  Coronary artery disease.  Heart failure.  Heart valve conditions.  High blood pressure.  Inflammation of the sac that surrounds the heart (pericarditis).  Heart surgery.  Certain heart rhythm disorders, such as Wolf-Parkinson-White syndrome.  Other causes include:  Pneumonia.  Obstructive sleep apnea.  Blockage of an artery in the lungs (pulmonary embolism, or PE).  Lung cancer.  Chronic lung disease.  Thyroid problems, especially if the thyroid is overactive (hyperthyroidism).  Caffeine.  Excessive alcohol  use or illegal drug use.  Use of some medicines, including certain decongestants and diet pills.  Sometimes, the cause cannot be found. What increases the risk? This condition is more likely to develop in:  People who are older in age.  People who smoke.  People who have diabetes mellitus.  People who are overweight (obese).  Athletes who exercise vigorously.  What are the signs or symptoms? Symptoms of this  condition include:  A feeling that your heart is beating rapidly or irregularly.  A feeling of discomfort or pain in your chest.  Shortness of breath.  Sudden light-headedness or weakness.  Getting tired easily during exercise.  In some cases, there are no symptoms. How is this diagnosed? Your health care provider may be able to detect atrial fibrillation when taking your pulse. If detected, this condition may be diagnosed with:  An electrocardiogram (ECG).  A Holter monitor test that records your heartbeat patterns over a 24-hour period.  Transthoracic echocardiogram (TTE) to evaluate how blood flows through your heart.  Transesophageal echocardiogram (TEE) to view more detailed images of your heart.  A stress test.  Imaging tests, such as a CT scan or chest X-ray.  Blood tests.  How is this treated? The main goals of treatment are to prevent blood clots from forming and to keep your heart beating at a normal rate and rhythm. The type of treatment that you receive depends on many factors, such as your underlying medical conditions and how you feel when you are experiencing atrial fibrillation. This condition may be treated with:  Medicine to slow down the heart rate, bring the heart's rhythm back to normal, or prevent clots from forming.  Electrical cardioversion. This is a procedure that resets your heart's rhythm by delivering a controlled, low-energy shock to the heart through your skin.  Different types of ablation, such as catheter ablation, catheter ablation with pacemaker, or surgical ablation. These procedures destroy the heart tissues that send abnormal signals. When the pacemaker is used, it is placed under your skin to help your heart beat in a regular rhythm.  Follow these instructions at home:  Take over-the counter and prescription medicines only as told by your health care provider.  If your health care provider prescribed a blood-thinning medicine  (anticoagulant), take it exactly as told. Taking too much blood-thinning medicine can cause bleeding. If you do not take enough blood-thinning medicine, you will not have the protection that you need against stroke and other problems.  Do not use tobacco products, including cigarettes, chewing tobacco, and e-cigarettes. If you need help quitting, ask your health care provider.  If you have obstructive sleep apnea, manage your condition as told by your health care provider.  Do not drink alcohol.  Do not drink beverages that contain caffeine, such as coffee, soda, and tea.  Maintain a healthy weight. Do not use diet pills unless your health care provider approves. Diet pills may make heart problems worse.  Follow diet instructions as told by your health care provider.  Exercise regularly as told by your health care provider.  Keep all follow-up visits as told by your health care provider. This is important. How is this prevented?  Avoid drinking beverages that contain caffeine or alcohol.  Avoid certain medicines, especially medicines that are used for breathing problems.  Avoid certain herbs and herbal medicines, such as those that contain ephedra or ginseng.  Do not use illegal drugs, such as cocaine and amphetamines.  Do not smoke.  Manage your high blood pressure. Contact a health care provider if:  You notice a change in the rate, rhythm, or strength of your heartbeat.  You are taking an anticoagulant and you notice increased bruising.  You tire more easily when you exercise or exert yourself. Get help right away if:  You have chest pain, abdominal pain, sweating, or weakness.  You feel nauseous.  You notice blood in your vomit, bowel movement, or urine.  You have shortness of breath.  You suddenly have swollen feet and ankles.  You feel dizzy.  You have sudden weakness or numbness of the face, arm, or leg, especially on one side of the body.  You have trouble  speaking, trouble understanding, or both (aphasia).  Your face or your eyelid droops on one side. These symptoms may represent a serious problem that is an emergency. Do not wait to see if the symptoms will go away. Get medical help right away. Call your local emergency services (911 in the [place] Do not drive yourself to the hospital. This information is not intended to replace advice given to you by your health care provider. Make sure you discuss any questions you have with your health care provider. Document Released: 03/25/2005 Document Revised: 08/02/2015 Document Reviewed: 07/20/2014 Elsevier Interactive Patient Education  2017 [employer].

## 2016-11-21 ENCOUNTER — Ambulatory Visit: Payer: Medicare Other | Admitting: Cardiovascular Disease

## 2016-11-22 ENCOUNTER — Telehealth: Payer: Self-pay | Admitting: Cardiovascular Disease

## 2016-11-22 ENCOUNTER — Ambulatory Visit (INDEPENDENT_AMBULATORY_CARE_PROVIDER_SITE_OTHER): Payer: Medicare Other

## 2016-11-22 ENCOUNTER — Other Ambulatory Visit: Payer: Self-pay

## 2016-11-22 DIAGNOSIS — R0602 Shortness of breath: Secondary | ICD-10-CM

## 2016-11-22 DIAGNOSIS — I4819 Other persistent atrial fibrillation: Secondary | ICD-10-CM

## 2016-11-22 DIAGNOSIS — I481 Persistent atrial fibrillation: Secondary | ICD-10-CM | POA: Diagnosis not present

## 2016-11-22 NOTE — Telephone Encounter (Signed)
Daughter is requesting after pt is called with echo result , to call her with results also.

## 2016-11-22 NOTE — Telephone Encounter (Addendum)
No signed DPR on file.

## 2016-11-26 ENCOUNTER — Telehealth: Payer: Self-pay | Admitting: Cardiovascular Disease

## 2016-11-26 NOTE — Telephone Encounter (Signed)
Returned call to patient. Let her know that Dr. Fletcher Anon has not resulted on her echo at this time but that as soon as he does we will let her know. She was very appreciative and said she would let her children know as they are all asking her what the result is.

## 2016-11-26 NOTE — Telephone Encounter (Signed)
Pt would like echo results. Please call. 

## 2017-01-01 ENCOUNTER — Other Ambulatory Visit: Payer: Self-pay | Admitting: Cardiovascular Disease

## 2017-01-01 DIAGNOSIS — I4892 Unspecified atrial flutter: Secondary | ICD-10-CM

## 2017-01-03 DIAGNOSIS — M7062 Trochanteric bursitis, left hip: Secondary | ICD-10-CM | POA: Diagnosis not present

## 2017-01-07 ENCOUNTER — Encounter: Payer: Self-pay | Admitting: Family Medicine

## 2017-01-07 ENCOUNTER — Ambulatory Visit (INDEPENDENT_AMBULATORY_CARE_PROVIDER_SITE_OTHER): Payer: Medicare Other | Admitting: Family Medicine

## 2017-01-07 VITALS — BP 124/70 | HR 102 | Temp 97.7°F | Wt 139.0 lb

## 2017-01-07 DIAGNOSIS — I10 Essential (primary) hypertension: Secondary | ICD-10-CM

## 2017-01-07 DIAGNOSIS — I481 Persistent atrial fibrillation: Secondary | ICD-10-CM

## 2017-01-07 DIAGNOSIS — G8929 Other chronic pain: Secondary | ICD-10-CM

## 2017-01-07 DIAGNOSIS — F331 Major depressive disorder, recurrent, moderate: Secondary | ICD-10-CM | POA: Diagnosis not present

## 2017-01-07 DIAGNOSIS — I4819 Other persistent atrial fibrillation: Secondary | ICD-10-CM

## 2017-01-07 DIAGNOSIS — Z23 Encounter for immunization: Secondary | ICD-10-CM

## 2017-01-07 DIAGNOSIS — M5136 Other intervertebral disc degeneration, lumbar region: Secondary | ICD-10-CM | POA: Diagnosis not present

## 2017-01-07 MED ORDER — DULOXETINE HCL 20 MG PO CPEP: ORAL_CAPSULE | ORAL | 3 refills | Status: DC

## 2017-01-07 MED ORDER — METOPROLOL TARTRATE 50 MG PO TABS: 75.0000 mg | ORAL_TABLET | Freq: Two times a day (BID) | ORAL | 3 refills | Status: DC

## 2017-01-07 MED ORDER — VENLAFAXINE HCL 37.5 MG PO TABS: 37.5000 mg | ORAL_TABLET | Freq: Every day | ORAL | 0 refills | Status: DC

## 2017-01-07 NOTE — Progress Notes (Signed)
BP 124/70 (BP Location: Left Arm, Patient Position: Sitting, Cuff Size: Normal)   Pulse (!) 102   Temp 97.7 F (36.5 C) (Oral)   Wt 139 lb (63 kg)   SpO2 97%   BMI 25.42 kg/m    CC: f/u visit Subjective:    Patient ID: [name], female    DOB: 08-Aug-1929, 83 y.o.   MRN: [id number]  HPI: [name] is a 83 y.o. female presenting on 01/07/2017 for Medication Problem (Wants to discuss pain and depression meds)   Here with daughter. High stress due to family illness.  See prior note for details. Last visit 11/07/2016 we started nucynta 25-50mg  daily and increase gabapentin to 200mg  bid. nucynta was very expensive - desires to stop this.  Last week she saw Dr Sharlet Salina, continued tramadol, and received L trochanteric bursal injection. This has helped some. Pt states Dr Sharlet Salina has asked [place] to take over tramadol prescription. She is taking tramadol 100mg  at bedtime. She takes tylenol during the day. She takes tizanidine at bedtime as well.   Depression - current regimen is effexor 37.5mg  bid. Progressively worsening. She remains very depressed over physical limitations due to her chronic back and joint pains. Daughter has noticed worsening as well.  She self stopped HCTZ. Denies pedal edema.   Relevant past medical, surgical, family and social history reviewed and updated as indicated. Interim medical history since our last visit reviewed. Allergies and medications reviewed and updated. Outpatient Medications Prior to Visit  Medication Sig Dispense Refill  . acetaminophen (TYLENOL) 500 MG tablet Take 500 mg by mouth every 6 (six) hours as needed for moderate pain.     Marland Kitchen acyclovir (ZOVIRAX) 400 MG tablet Take 400 mg by mouth daily.      Marland Kitchen atorvastatin (LIPITOR) 20 MG tablet TAKE ONE TABLET EVERY DAY 90 tablet 1  . calcium-vitamin D (OSCAL WITH D) 500-200 MG-UNIT per tablet Take 1 tablet by mouth daily.      . Cholecalciferol (VITAMIN D3) 1000 units CAPS Take 1 capsule (1,000  Units total) by mouth daily. 30 capsule   . ELIQUIS 5 MG TABS tablet TAKE ONE TABLET BY MOUTH TWICE DAILY 60 tablet 6  . ferrous sulfate 325 (65 FE) MG tablet Take 1 tablet (325 mg total) by mouth daily with breakfast.    . gabapentin (NEURONTIN) 100 MG capsule Take 2 capsules (200 mg total) by mouth 2 (two) times daily. 120 capsule 3  . pantoprazole (PROTONIX) 40 MG tablet TAKE ONE TABLET EVERY DAY 30 tablet 6  . prednisoLONE acetate (PRED FORTE) 1 % ophthalmic suspension Place 1 drop into the left eye daily.     Marland Kitchen rOPINIRole (REQUIP) 1 MG tablet TAKE TWO TABLETS AT BEDTIME 60 tablet 6  . tiZANidine (ZANAFLEX) 4 MG capsule Take 4 mg by mouth 2 (two) times daily.     . vitamin B-12 (CYANOCOBALAMIN) 1000 MCG tablet Take 1,000 mcg by mouth daily.    . metoprolol tartrate (LOPRESSOR) 25 MG tablet TAKE ONE TABLET TWICE DAILY 60 tablet 2  . tapentadol (NUCYNTA) 50 MG tablet Take 0.5-1 tablets (25-50 mg total) by mouth 2 (two) times daily as needed. 30 tablet 0  . venlafaxine (EFFEXOR) 37.5 MG tablet TAKE ONE TABLET TWICE DAILY WITH A MEAL 60 tablet 9  . hydrochlorothiazide (MICROZIDE) 12.5 MG capsule TAKE 1 CAPSULE EVERY DAY 30 capsule 9  . metoprolol tartrate (LOPRESSOR) 50 MG tablet Take 1.5 tablets (75 mg total) by mouth 2 (two) times  daily. 180 tablet 3   No facility-administered medications prior to visit.      Per HPI unless specifically indicated in ROS section below Review of Systems     Objective:    BP 124/70 (BP Location: Left Arm, Patient Position: Sitting, Cuff Size: Normal)   Pulse (!) 102   Temp 97.7 F (36.5 C) (Oral)   Wt 139 lb (63 kg)   SpO2 97%   BMI 25.42 kg/m   Wt Readings from Last 3 Encounters:  01/07/17 139 lb (63 kg)  11/12/16 142 lb 12 oz (64.8 kg)  11/11/16 142 lb 1.9 oz (64.5 kg)    Physical Exam  Constitutional: She appears well-developed and well-nourished. No distress.  Psychiatric: She has a normal mood and affect.  Nursing note and vitals  reviewed.      Assessment & Plan:  Over 25 minutes were spent face-to-face with the patient during this encounter and >50% of that time was spent on counseling and coordination of care  Problem List Items Addressed This Visit    DDD (degenerative disc disease), lumbar    Chronic lower back pain due to this. Not surgical candidate, has not really responded to [facility].       Encounter for chronic pain management    Nucynta was too expensive.  Now desires to continue tylenol during the day, tramadol 100mg  at night. Requests we take over this prescription, states Dr Sharlet Salina agrees. I feel comfortable with this plan. Advised to let us know when she needs next tramadol refill.       Essential hypertension    Reviewed med dosing. She has stopped hctz without worsening pedal edema or worse blood pressure control. Reviewed cards note from 10/2016 - recommended metoprolol 75mg  bid. Reviewed with patient, new dose sent to pharmacy.       Relevant Medications   metoprolol tartrate (LOPRESSOR) 50 MG tablet   MDD (major depressive disorder), recurrent episode ([place]) - Primary    Deteriorating, associated to chronic pain, pt has noticed worse with narcotics.  Reviewed options - she desires to trial cymbalta, hopeful for some pain modulatory effect. Will cross taper off effexor onto cymbalta as per instructions. RTC 1 mo f/u visit. Pt agrees with plan.       Relevant Medications   venlafaxine (EFFEXOR) 37.5 MG tablet   DULoxetine (CYMBALTA) 20 MG capsule   Persistent atrial fibrillation (HCC)    Chronic. Continue eliquis.       Relevant Medications   metoprolol tartrate (LOPRESSOR) 50 MG tablet    Other Visit Diagnoses    Need for influenza vaccination       Relevant Orders   Flu Vaccine QUAD 6+ mos PF IM (Fluarix Quad PF) (Completed)       Follow up plan: Return in about 4 weeks (around 02/04/2017) for follow up visit.  Crystal Bush, MD

## 2017-01-07 NOTE — Assessment & Plan Note (Addendum)
Deteriorating, associated to chronic pain, pt has noticed worse with narcotics.  Reviewed options - she desires to trial cymbalta, hopeful for some pain modulatory effect. Will cross taper off effexor onto cymbalta as per instructions. RTC 1 mo f/u visit. Pt agrees with plan.

## 2017-01-07 NOTE — Patient Instructions (Addendum)
Flu shot today New metoprolol dose is 75mg  twice daily. If too much fatigue, drop to 50mg  twice daily and notify us.  Let us know when you need tramadol refilled.  Let's transition to cymbalta - decrease effexor to 1 tablet nightly for 2 weeks then stop. At the same time, start cymbalta 20mg  daily for 2 weeks then increase to 40mg  daily.  Return to see me in 1 month.

## 2017-01-07 NOTE — Assessment & Plan Note (Signed)
Chronic lower back pain due to this. Not surgical candidate, has not really responded to [facility].

## 2017-01-07 NOTE — Assessment & Plan Note (Signed)
Chronic. Continue eliquis.

## 2017-01-07 NOTE — Assessment & Plan Note (Signed)
Reviewed med dosing. She has stopped hctz without worsening pedal edema or worse blood pressure control. Reviewed cards note from 10/2016 - recommended metoprolol 75mg  bid. Reviewed with patient, new dose sent to pharmacy.

## 2017-01-07 NOTE — Assessment & Plan Note (Addendum)
Nucynta was too expensive.  Now desires to continue tylenol during the day, tramadol 100mg  at night. Requests we take over this prescription, states Dr Sharlet Salina agrees. I feel comfortable with this plan. Advised to let us know when she needs next tramadol refill.

## 2017-01-13 ENCOUNTER — Ambulatory Visit (INDEPENDENT_AMBULATORY_CARE_PROVIDER_SITE_OTHER): Payer: Medicare Other | Admitting: Cardiovascular Disease

## 2017-01-13 ENCOUNTER — Encounter: Payer: Self-pay | Admitting: Cardiovascular Disease

## 2017-01-13 VITALS — BP 158/90 | HR 99 | Ht 63.0 in | Wt 142.0 lb

## 2017-01-13 DIAGNOSIS — E785 Hyperlipidemia, unspecified: Secondary | ICD-10-CM | POA: Diagnosis not present

## 2017-01-13 DIAGNOSIS — I10 Essential (primary) hypertension: Secondary | ICD-10-CM

## 2017-01-13 DIAGNOSIS — I481 Persistent atrial fibrillation: Secondary | ICD-10-CM

## 2017-01-13 DIAGNOSIS — I4819 Other persistent atrial fibrillation: Secondary | ICD-10-CM

## 2017-01-13 NOTE — Progress Notes (Signed)
Cardiology Office Note   Date:  01/13/2017   ID:  [name], DOB 07-Jul-1929, MRN [id number]  PCP:  Ria Bush, MD  Cardiologist:   [name], MD   Chief Complaint  Patient presents with  . other    2 month follow up. patient c/o SOB and being tired. Meds reviewed verbally with patient.       History of Present Illness: [name] is a 83 y.o. female who presents for a follow-up visit regarding paroxysmal atrial fibrillation. Nuclear stress test in 03/2012 showed no evidence of ischemia. Echocardiogram in 2013 showed normal LV systolic function, mild to moderate aortic and mitral regurgitation, moderate tricuspid regurgitation with mild pulmonary hypertension.   She went into A. fib with RVR in July in the setting of increased lower back pain. Diltiazem was switched to metoprolol. Ventricular rate is more controlled although she continues to complain of significant fatigue and lack of energy. She also describes worsening exertional dyspnea with no chest pain. She had an echocardiogram done in August which showed normal LV systolic function, mild mitral regurgitation and minimal pulmonary hypertension.  Past Medical History:  Diagnosis Date  . Abdominal aortic atherosclerosis ([place]) 09/2015   by xray  . Anxiety   . BCC (basal cell carcinoma of skin) 05/2011   on nose  . Breast lesion    a. diagnosed with complex sclerosing lesion with calcifications of the right breast in 08/2016  . Breast mass, right   . Chest pain    a. nuclear stress test 03/2012: normal  . DDD (degenerative disc disease), lumbosacral 12/06/1992  . Dysrhythmia    a-fib  . Gallstones 09/2015   incidentally by xray  . GERD (gastroesophageal reflux disease) 05/1999  . History of shingles    ophthalmic, takes acyclovir daily  . Hyperlipidemia 12/2003  . Hypertension 1990  . Mitral regurgitation    a. echo 03/2012: EF 60-65%, no RWMA, mild to mod AI/MR, mod TR, PASP 37 mmHg  .  Osteoarthritis 08/21/1989  . Osteoporosis with fracture 11/1997   with compression fracture T12  . PAF (paroxysmal atrial fibrillation) ([place]) 03/16/2012   a. on Eliquis; b. CHADS2VASc => 5 (HTN, age x 2, vascular disease, sex category)  . Rheumatoid arthritis ([place])   . RLS (restless legs syndrome)     Past Surgical History:  Procedure Laterality Date  . ABDOMINAL HYSTERECTOMY  Age 28 - 56   S/P TAH  . abdominal ultrasound  04/23/2004   Gallstones  . BREAST LUMPECTOMY WITH RADIOACTIVE SEED LOCALIZATION Right 10/2016   breast lumpectomy with radioactive seed localization and margin assessment for complex sclerosing lesion [facility])  . BREAST LUMPECTOMY WITH RADIOACTIVE SEED LOCALIZATION Right 11/05/2016   Procedure: RIGHT BREAST LUMPECTOMY WITH RADIOACTIVE SEED LOCALIZATION;  Surgeon: Fanny Skates, MD;  Location: [place];  Service: General;  Laterality: Right;  . CATARACT EXTRACTION  05/2010   left eye  . CERVICAL DISCECTOMY  1992   Fusion (Dr. Joya Salm)  . ESI Bilateral 01/2016   S1 transforaminal ESI x3  . ESOPHAGOGASTRODUODENOSCOPY  01/01/2002   with ulcer, bx. neg; + stricture gastric ulcer with hemorrhage  . ESOPHAGOGASTRODUODENOSCOPY  04/23/2004   Esophageal stricture -- dilated  . nuclear stress test  03/2012   no ischemia  . SKIN CANCER EXCISION  05/20/2011   BCC from nose  . US ECHOCARDIOGRAPHY  03/2012   in flutter, EF 60%, mild-mod aortic, mitral, tricuspid regurg, mildly dilated LA  Current Outpatient Prescriptions  Medication Sig Dispense Refill  . acetaminophen (TYLENOL) 500 MG tablet Take 500 mg by mouth every 6 (six) hours as needed for moderate pain.     Marland Kitchen acyclovir (ZOVIRAX) 400 MG tablet Take 400 mg by mouth daily.      Marland Kitchen atorvastatin (LIPITOR) 20 MG tablet TAKE ONE TABLET EVERY DAY 90 tablet 1  . calcium-vitamin D (OSCAL WITH D) 500-200 MG-UNIT per tablet Take 1 tablet by mouth daily.      . Cholecalciferol (VITAMIN D3) 1000 units  CAPS Take 1 capsule (1,000 Units total) by mouth daily. 30 capsule   . DULoxetine (CYMBALTA) 20 MG capsule Take cymbalta daily for 2 week then increase to 40mg  daily 60 capsule 3  . ELIQUIS 5 MG TABS tablet TAKE ONE TABLET BY MOUTH TWICE DAILY 60 tablet 6  . ferrous sulfate 325 (65 FE) MG tablet Take 1 tablet (325 mg total) by mouth daily with breakfast.    . gabapentin (NEURONTIN) 100 MG capsule Take 2 capsules (200 mg total) by mouth 2 (two) times daily. 120 capsule 3  . metoprolol tartrate (LOPRESSOR) 50 MG tablet Take 1.5 tablets (75 mg total) by mouth 2 (two) times daily. 270 tablet 3  . pantoprazole (PROTONIX) 40 MG tablet TAKE ONE TABLET EVERY DAY 30 tablet 6  . prednisoLONE acetate (PRED FORTE) 1 % ophthalmic suspension Place 1 drop into the left eye daily.     Marland Kitchen rOPINIRole (REQUIP) 1 MG tablet TAKE TWO TABLETS AT BEDTIME 60 tablet 6  . tiZANidine (ZANAFLEX) 4 MG capsule Take 4 mg by mouth 2 (two) times daily.     Marland Kitchen venlafaxine (EFFEXOR) 37.5 MG tablet Take 1 tablet (37.5 mg total) by mouth at bedtime. For 2 weeks then stop 30 tablet 0  . vitamin B-12 (CYANOCOBALAMIN) 1000 MCG tablet Take 1,000 mcg by mouth daily.     No current facility-administered medications for this visit.     Allergies:   Penicillins    Social History:  The patient  reports that she quit smoking about 40 years ago. She has a 0.25 pack-year smoking history. She has never used smokeless tobacco. She reports that she drinks about 1.2 oz of alcohol per week . She reports that she does not use drugs.   Family History:  The patient's family history includes Cancer in her brother; Diabetes in her brother, brother, brother, and sister; Emphysema in her mother; Heart disease in her brother and father; Hypertension in her brother; Stroke in her brother, father, and sister.    ROS:  Please see the history of present illness.   Otherwise, review of systems are positive for none.   All other systems are reviewed and  negative.    PHYSICAL EXAM: VS:  BP (!) 158/90 (BP Location: Left Arm, Patient Position: Sitting, Cuff Size: Normal)   Pulse 99   Ht 5\' 3"  (1.6 m)   Wt 142 lb (64.4 kg)   BMI 25.15 kg/m  , BMI Body mass index is 25.15 kg/m. GEN: Well nourished, well developed, in no acute distress  HEENT: normal  Neck: no JVD, carotid bruits, or masses Cardiac: Irregularly irregular and mildly tachycardic; no murmurs, rubs, or gallops,no edema  Respiratory:  clear to auscultation bilaterally, normal work of breathing GI: soft, nontender, nondistended, + BS MS: no deformity or atrophy  Skin: warm and dry, no rash Neuro:  Strength and sensation are intact Psych: euthymic mood, full affect Normal distal pulses. Femoral pulses normal bilaterally  EKG:  EKG is ordered today. The ekg ordered today demonstrates  atrial fibrillation with ventricular rate of 99 bpm.   Recent Labs: 10/31/2016: ALT 22; BUN 11; Creatinine, Ser 0.86; Hemoglobin 13.5; Platelets 247; Potassium 4.2; Sodium 139    Lipid Panel    Component Value Date/Time   CHOL 195 05/28/2016 1253   TRIG 107.0 05/28/2016 1253   HDL 53.70 05/28/2016 1253   CHOLHDL 4 05/28/2016 1253   VLDL 21.4 05/28/2016 1253   LDLCALC 120 (H) 05/28/2016 1253   LDLDIRECT 173.1 02/19/2010 0908      Wt Readings from Last 3 Encounters:  01/13/17 142 lb (64.4 kg)  01/07/17 139 lb (63 kg)  11/12/16 142 lb 12 oz (64.8 kg)       No flowsheet data found.    ASSESSMENT AND PLAN:  1. Persistental fibrillation:  she continues to be in atrial fibrillation. ventricular rate is more controlled on current dose of metoprolol.  It is highly possible that worsening exertional dyspnea and fatigue are related to atrial fibrillation. Due to that, I have suggested proceeding with cardioversion. She wants to think about this and discuss with her daughters. She is currently on anticoagulation with Eliquis and has not missed any doses.    2. Essential hypertension:  Blood pressure is mildly elevated. I suggested increasing metoprolol to 100 mg twice daily but she is worried about increased fatigue.  3. Hyperlipidemia: Currently on atorvastatin 20 mg once daily with most recent LDL of 75.    Disposition:   FU with me in 2  months  Signed,  [name], MD  01/13/2017 3:11 PM    [place]

## 2017-01-13 NOTE — Patient Instructions (Addendum)
Medication Instructions:  Your physician recommends that you continue on your current medications as directed. Please refer to the Current Medication list given to you today.   Labwork: none  Testing/Procedures: Please call if you decide to proceed with the cardioversion  Follow-Up: Your physician recommends that you schedule a follow-up appointment in: 2 months with Dr. Fletcher [name].    Any Other Special Instructions Will Be Listed Below (If Applicable).     If you need a refill on your cardiac medications before your next appointment, please call your pharmacy.   Electrical Cardioversion Electrical cardioversion is the delivery of a jolt of electricity to restore a normal rhythm to the heart. A rhythm that is too fast or is not regular keeps the heart from pumping well. In this procedure, sticky patches or metal paddles are placed on the chest to deliver electricity to the heart from a device. This procedure may be done in an emergency if:  There is low or no blood pressure as a result of the heart rhythm.  Normal rhythm must be restored as fast as possible to protect the brain and heart from further damage.  It may save a life.  This procedure may also be done for irregular or fast heart rhythms that are not immediately life-threatening. Tell a health care provider about:  Any allergies you have.  All medicines you are taking, including vitamins, herbs, eye drops, creams, and over-the-counter medicines.  Any problems you or family members have had with anesthetic medicines.  Any blood disorders you have.  Any surgeries you have had.  Any medical conditions you have.  Whether you are pregnant or may be pregnant. What are the risks? Generally, this is a safe procedure. However, problems may occur, including:  Allergic reactions to medicines.  A blood clot that breaks free and travels to other parts of your body.  The possible return of an abnormal heart rhythm within  hours or days after the procedure.  Your heart stopping (cardiac arrest). This is rare.  What happens before the procedure? Medicines  Your health care provider may have you start taking: ? Blood-thinning medicines (anticoagulants) so your blood does not clot as easily. ? Medicines may be given to help stabilize your heart rate and rhythm.  Ask your health care provider about changing or stopping your regular medicines. This is especially important if you are taking diabetes medicines or blood thinners. General instructions  Plan to have someone take you home from the hospital or clinic.  If you will be going home right after the procedure, plan to have someone with you for 24 hours.  Follow instructions from your health care provider about eating or drinking restrictions. What happens during the procedure?  To lower your risk of infection: ? Your health care team will wash or sanitize their hands. ? Your skin will be washed with soap.  An IV tube will be inserted into one of your veins.  You will be given a medicine to help you relax (sedative).  Sticky patches (electrodes) or metal paddles may be placed on your chest.  An electrical shock will be delivered. The procedure may vary among health care providers and hospitals. What happens after the procedure?  Your blood pressure, heart rate, breathing rate, and blood oxygen level will be monitored until the medicines you were given have worn off.  Do not drive for 24 hours if you were given a sedative.  Your heart rhythm will be watched to make sure it  does not change. This information is not intended to replace advice given to you by your health care provider. Make sure you discuss any questions you have with your health care provider. Document Released: 03/15/2002 Document Revised: 11/22/2015 Document Reviewed: 09/29/2015 Elsevier Interactive Patient Education  2017 [employer].  Electrical Cardioversion, Care  After This sheet gives you information about how to care for yourself after your procedure. Your health care provider may also give you more specific instructions. If you have problems or questions, contact your health care provider. What can I expect after the procedure? After the procedure, it is common to have:  Some redness on the skin where the shocks were given.  Follow these instructions at home:  Do not drive for 24 hours if you were given a medicine to help you relax (sedative).  Take over-the-counter and prescription medicines only as told by your health care provider.  Ask your health care provider how to check your pulse. Check it often.  Rest for 48 hours after the procedure or as told by your health care provider.  Avoid or limit your caffeine use as told by your health care provider. Contact a health care provider if:  You feel like your heart is beating too quickly or your pulse is not regular.  You have a serious muscle cramp that does not go away. Get help right away if:  You have discomfort in your chest.  You are dizzy or you feel faint.  You have trouble breathing or you are short of breath.  Your speech is slurred.  You have trouble moving an arm or leg on one side of your body.  Your fingers or toes turn cold or blue. This information is not intended to replace advice given to you by your health care provider. Make sure you discuss any questions you have with your health care provider. Document Released: 01/13/2013 Document Revised: 10/27/2015 Document Reviewed: 09/29/2015 Elsevier Interactive Patient Education  [employer].

## 2017-01-16 ENCOUNTER — Telehealth: Payer: Self-pay | Admitting: Cardiovascular Disease

## 2017-01-16 NOTE — Telephone Encounter (Signed)
Please call,she wants to know what kind of procedure Dr Fletcher Anon discussed with the patient. She wants to make sure what was said to pt; please.

## 2017-01-16 NOTE — Telephone Encounter (Signed)
Explained to the daughter that there was not any dpr on file therefore we could not speak to her about her mother's chart. She will get her mother to fill this out.   The daughter would like for her name and number to be included in the patient's chart for future reference when the DPR is signed. Daughter's name is [name] [phone]

## 2017-01-17 ENCOUNTER — Telehealth: Payer: Self-pay | Admitting: Cardiovascular Disease

## 2017-01-17 NOTE — Telephone Encounter (Signed)
Pt and daughter came into the office today so pt could list daughter, [name], on [place].  [name] and patient had questions regarding the DCCV as recommended by Dr. Fletcher Anon at her Oct 8 OV.  I met with them in the lobby and reviewed information, answering their questions.  At the end of our conversation, pt stated she would like to proceed with scheduling the procedure.  Possible dates include October 22 or 29 and November 15 or 16. Awaiting call back from pt to select date.  Will route to MD to make aware of her decision.

## 2017-01-17 NOTE — Telephone Encounter (Signed)
Ok. Thanks!

## 2017-01-21 NOTE — Telephone Encounter (Signed)
Left detailed message on pt's home voice mail. Possible cardioversion dates Oct 26, 29 or Nov 15, 16. Can look at other dates with another provider as well. Awaiting call back.

## 2017-01-22 NOTE — Telephone Encounter (Signed)
Pt would like to schedule DCCV 10/16.  Awaiting response from Dr. Fletcher Anon if this is possible

## 2017-01-22 NOTE — Telephone Encounter (Signed)
Pt is returning your call

## 2017-01-23 ENCOUNTER — Other Ambulatory Visit: Payer: Self-pay

## 2017-01-23 DIAGNOSIS — Z01812 Encounter for preprocedural laboratory examination: Secondary | ICD-10-CM

## 2017-01-23 NOTE — Telephone Encounter (Signed)
Dr. Fletcher Anon agreeable to 10/26 DCCV. Scheduled with Pamala Hurry. S/w pt to confirm date, time, pre- and post-procedure instructions. She will have labs by 10/24 at the [facility]. Orders placed. Pt verbalized understanding of all instructions and repeated back to me.

## 2017-01-29 ENCOUNTER — Other Ambulatory Visit
Admission: RE | Admit: 2017-01-29 | Discharge: 2017-01-29 | Disposition: A | Discharge status: Home / Self Care | Payer: Medicare Other | Source: Ambulatory Visit | Attending: Cardiovascular Disease | Admitting: Cardiovascular Disease

## 2017-01-29 DIAGNOSIS — Z01812 Encounter for preprocedural laboratory examination: Secondary | ICD-10-CM | POA: Insufficient documentation

## 2017-01-29 LAB — CBC
HEMATOCRIT: 41.1 % (ref 35.0–47.0)
HEMOGLOBIN: 13.9 g/dL (ref 12.0–16.0)
MCH: 31.6 pg (ref 26.0–34.0)
MCHC: 33.9 g/dL (ref 32.0–36.0)
MCV: 93.3 fL (ref 80.0–100.0)
Platelets: 305 10*3/uL (ref 150–440)
RBC: 4.41 MIL/uL (ref 3.80–5.20)
RDW: 14 % (ref 11.5–14.5)
WBC: 7.7 10*3/uL (ref 3.6–11.0)

## 2017-01-29 LAB — BASIC METABOLIC PANEL
ANION GAP: 9 (ref 5–15)
BUN: 11 mg/dL (ref 6–20)
CO2: 29 mmol/L (ref 22–32)
Calcium: 10 mg/dL (ref 8.9–10.3)
Chloride: 104 mmol/L (ref 101–111)
Creatinine, Ser: 1.01 mg/dL — ABNORMAL HIGH (ref 0.44–1.00)
GFR calc Af Amer: 57 mL/min — ABNORMAL LOW (ref >60)
GFR calc non Af Amer: 49 mL/min — ABNORMAL LOW (ref >60)
Glucose, Bld: 110 mg/dL — ABNORMAL HIGH (ref 65–99)
POTASSIUM: 3.9 mmol/L (ref 3.5–5.1)
SODIUM: 142 mmol/L (ref 135–145)

## 2017-01-29 LAB — PROTIME-INR
INR: 1.4
Prothrombin Time: 17 seconds — ABNORMAL HIGH (ref 11.4–15.2)

## 2017-01-30 ENCOUNTER — Telehealth: Payer: Self-pay | Admitting: Cardiovascular Disease

## 2017-01-30 NOTE — Telephone Encounter (Signed)
Reviewed DCCV instructions w/pt who verbalized understanding. Confirmed that pt has not missed any doses of Eliquis.

## 2017-01-30 NOTE — Telephone Encounter (Signed)
Left message on daughter's VM to contact the office.

## 2017-01-30 NOTE — Telephone Encounter (Signed)
PT daughter calling They would like some confirmation and a little instruction on when and where to be for the surgery pt is having in the morning Please call to advise

## 2017-01-31 ENCOUNTER — Encounter: Payer: Self-pay | Admitting: Emergency Medicine

## 2017-01-31 ENCOUNTER — Ambulatory Visit: Admission: RE | Admit: 2017-01-31 | Payer: Medicare Other | Source: Ambulatory Visit | Admitting: Cardiovascular Disease

## 2017-01-31 ENCOUNTER — Other Ambulatory Visit: Payer: Self-pay

## 2017-01-31 ENCOUNTER — Encounter: Admission: RE | Payer: Self-pay | Source: Ambulatory Visit

## 2017-01-31 ENCOUNTER — Ambulatory Visit: Payer: Medicare Other | Admitting: Anesthesiology

## 2017-01-31 ENCOUNTER — Encounter
Admission: RE | Disposition: A | Discharge status: Home / Self Care | Payer: Self-pay | Source: Ambulatory Visit | Attending: Cardiovascular Disease

## 2017-01-31 ENCOUNTER — Ambulatory Visit
Admission: RE | Admit: 2017-01-31 | Discharge: 2017-01-31 | Disposition: A | Discharge status: Home / Self Care | Payer: Medicare Other | Source: Ambulatory Visit | Attending: Cardiovascular Disease | Admitting: Cardiovascular Disease

## 2017-01-31 DIAGNOSIS — E785 Hyperlipidemia, unspecified: Secondary | ICD-10-CM | POA: Diagnosis not present

## 2017-01-31 DIAGNOSIS — I739 Peripheral vascular disease, unspecified: Secondary | ICD-10-CM | POA: Insufficient documentation

## 2017-01-31 DIAGNOSIS — Z7901 Long term (current) use of anticoagulants: Secondary | ICD-10-CM | POA: Diagnosis not present

## 2017-01-31 DIAGNOSIS — G2581 Restless legs syndrome: Secondary | ICD-10-CM | POA: Diagnosis not present

## 2017-01-31 DIAGNOSIS — I480 Paroxysmal atrial fibrillation: Secondary | ICD-10-CM | POA: Diagnosis not present

## 2017-01-31 DIAGNOSIS — I083 Combined rheumatic disorders of mitral, aortic and tricuspid valves: Secondary | ICD-10-CM | POA: Diagnosis not present

## 2017-01-31 DIAGNOSIS — K219 Gastro-esophageal reflux disease without esophagitis: Secondary | ICD-10-CM | POA: Diagnosis not present

## 2017-01-31 DIAGNOSIS — F419 Anxiety disorder, unspecified: Secondary | ICD-10-CM | POA: Diagnosis not present

## 2017-01-31 DIAGNOSIS — M1990 Unspecified osteoarthritis, unspecified site: Secondary | ICD-10-CM | POA: Diagnosis not present

## 2017-01-31 DIAGNOSIS — F329 Major depressive disorder, single episode, unspecified: Secondary | ICD-10-CM | POA: Diagnosis not present

## 2017-01-31 DIAGNOSIS — Z79899 Other long term (current) drug therapy: Secondary | ICD-10-CM | POA: Insufficient documentation

## 2017-01-31 DIAGNOSIS — I10 Essential (primary) hypertension: Secondary | ICD-10-CM | POA: Diagnosis not present

## 2017-01-31 DIAGNOSIS — Z8249 Family history of ischemic heart disease and other diseases of the circulatory system: Secondary | ICD-10-CM | POA: Insufficient documentation

## 2017-01-31 DIAGNOSIS — I4891 Unspecified atrial fibrillation: Secondary | ICD-10-CM | POA: Diagnosis not present

## 2017-01-31 DIAGNOSIS — Z87891 Personal history of nicotine dependence: Secondary | ICD-10-CM | POA: Insufficient documentation

## 2017-01-31 DIAGNOSIS — I481 Persistent atrial fibrillation: Secondary | ICD-10-CM

## 2017-01-31 HISTORY — PX: CARDIOVERSION: EP1203

## 2017-01-31 SURGERY — CARDIOVERSION (CATH LAB)
Anesthesia: General

## 2017-01-31 MED ORDER — SODIUM CHLORIDE 0.9 % IV SOLN
INTRAVENOUS | Status: DC | PRN
  Administered 2017-01-31: 07:00:00 via INTRAVENOUS

## 2017-01-31 MED ORDER — PROPOFOL 10 MG/ML IV BOLUS
INTRAVENOUS | Status: DC | PRN
  Administered 2017-01-31: 10 mg via INTRAVENOUS
  Administered 2017-01-31: 40 mg via INTRAVENOUS
  Administered 2017-01-31: 10 mg via INTRAVENOUS

## 2017-01-31 NOTE — Anesthesia Post-op Follow-up Note (Signed)
Anesthesia QCDR form completed.        

## 2017-01-31 NOTE — Anesthesia Procedure Notes (Signed)
Date/Time: 01/31/2017 7:35 AM Performed by: Johnna Acosta Pre-anesthesia Checklist: Patient identified, Emergency Drugs available, Suction available, Patient being monitored and Timeout performed Patient Re-evaluated:Patient Re-evaluated prior to induction Oxygen Delivery Method: Nasal cannula

## 2017-01-31 NOTE — Interval H&P Note (Signed)
History and Physical Interval Note:  01/31/2017 11:17 AM  [name]  has presented today for surgery, with the diagnosis of Cardioversion   Afib 7:30a Start time  The various methods of treatment have been discussed with the patient and family. After consideration of risks, benefits and other options for treatment, the patient has consented to  Procedure(s): CARDIOVERSION (N/A) as a surgical intervention .  The patient's history has been reviewed, patient examined, no change in status, stable for surgery.  I have reviewed the patient's chart and labs.  Questions were answered to the patient's satisfaction.     [name]

## 2017-01-31 NOTE — Anesthesia Preprocedure Evaluation (Signed)
Anesthesia Evaluation  Patient identified by MRN, date of birth, ID band Patient awake    Reviewed: Allergy & Precautions, H&P , NPO status , Patient's Chart, lab work & pertinent test results, reviewed documented beta blocker date and time   Airway Mallampati: III  TM Distance: >3 FB Neck ROM: Full    Dental no notable dental hx. (+) Upper Dentures, Lower Dentures, Dental Advisory Given   Pulmonary neg pulmonary ROS, former smoker,    Pulmonary exam normal breath sounds clear to auscultation       Cardiovascular hypertension, Pt. on medications and Pt. on home beta blockers + Peripheral Vascular Disease  + dysrhythmias Atrial Fibrillation + Valvular Problems/Murmurs MR  Rhythm:Irregular Rate:Normal     Neuro/Psych PSYCHIATRIC DISORDERS Anxiety Depression negative neurological ROS     GI/Hepatic Neg liver ROS, PUD, GERD  Medicated and Controlled,  Endo/Other  negative endocrine ROS  Renal/GU negative Renal ROS  negative genitourinary   Musculoskeletal  (+) Arthritis , Osteoarthritis,    Abdominal   Peds negative pediatric ROS (+)  Hematology negative hematology ROS (+)   Anesthesia Other Findings   Reproductive/Obstetrics negative OB ROS                             Anesthesia Physical  Anesthesia Plan  ASA: III  Anesthesia Plan: General   Post-op Pain Management:    Induction: Intravenous  PONV Risk Score and Plan: 4 or greater and Ondansetron, Dexamethasone and Treatment may vary due to age or medical condition  Airway Management Planned: Nasal Cannula  Additional Equipment:   Intra-op Plan:   Post-operative Plan:   Informed Consent: I have reviewed the patients History and Physical, chart, labs and discussed the procedure including the risks, benefits and alternatives for the proposed anesthesia with the patient or authorized representative who has indicated his/her  understanding and acceptance.   Dental advisory given  Plan Discussed with: CRNA and Surgeon  Anesthesia Plan Comments:         Anesthesia Quick Evaluation

## 2017-01-31 NOTE — Transfer of Care (Signed)
Immediate Anesthesia Transfer of Care Note  [name]  Procedure(s) Performed: CARDIOVERSION (N/A )  Patient Location: PACU  Anesthesia Type:General  Level of Consciousness: sedated  Airway & Oxygen Therapy: Patient Spontanous Breathing and Patient connected to nasal cannula oxygen  Post-op Assessment: Report given to RN and Post -op Vital signs reviewed and stable  Post vital signs: Reviewed and stable  Last Vitals:  Vitals:   01/31/17 0742 01/31/17 0743  BP: 130/88 132/70  Pulse: (!) 101 99  Resp: (!) 21 (!) 21  Temp:    SpO2: 96% 95%    Last Pain:  Vitals:   01/31/17 0642  TempSrc: Oral  PainSc: 8          Complications: No apparent anesthesia complications

## 2017-01-31 NOTE — H&P (View-Only) (Signed)
Cardiology Office Note   Date:  01/13/2017   ID:  [name], DOB 05-Mar-1930, MRN [id number]  PCP:  Ria Bush, MD  Cardiologist:   [name], MD   Chief Complaint  Patient presents with  . other    2 month follow up. patient c/o SOB and being tired. Meds reviewed verbally with patient.       History of Present Illness: [name] is a 83 y.o. female who presents for a follow-up visit regarding paroxysmal atrial fibrillation. Nuclear stress test in 03/2012 showed no evidence of ischemia. Echocardiogram in 2013 showed normal LV systolic function, mild to moderate aortic and mitral regurgitation, moderate tricuspid regurgitation with mild pulmonary hypertension.   She went into A. fib with RVR in July in the setting of increased lower back pain. Diltiazem was switched to metoprolol. Ventricular rate is more controlled although she continues to complain of significant fatigue and lack of energy. She also describes worsening exertional dyspnea with no chest pain. She had an echocardiogram done in August which showed normal LV systolic function, mild mitral regurgitation and minimal pulmonary hypertension.  Past Medical History:  Diagnosis Date  . Abdominal aortic atherosclerosis ([place]) 09/2015   by xray  . Anxiety   . BCC (basal cell carcinoma of skin) 05/2011   on nose  . Breast lesion    a. diagnosed with complex sclerosing lesion with calcifications of the right breast in 08/2016  . Breast mass, right   . Chest pain    a. nuclear stress test 03/2012: normal  . DDD (degenerative disc disease), lumbosacral 12/06/1992  . Dysrhythmia    a-fib  . Gallstones 09/2015   incidentally by xray  . GERD (gastroesophageal reflux disease) 05/1999  . History of shingles    ophthalmic, takes acyclovir daily  . Hyperlipidemia 12/2003  . Hypertension 1990  . Mitral regurgitation    a. echo 03/2012: EF 60-65%, no RWMA, mild to mod AI/MR, mod TR, PASP 37 mmHg  .  Osteoarthritis 08/21/1989  . Osteoporosis with fracture 11/1997   with compression fracture T12  . PAF (paroxysmal atrial fibrillation) ([place]) 03/16/2012   a. on Eliquis; b. CHADS2VASc => 5 (HTN, age x 2, vascular disease, sex category)  . Rheumatoid arthritis ([place])   . RLS (restless legs syndrome)     Past Surgical History:  Procedure Laterality Date  . ABDOMINAL HYSTERECTOMY  Age 38 - 17   S/P TAH  . abdominal ultrasound  04/23/2004   Gallstones  . BREAST LUMPECTOMY WITH RADIOACTIVE SEED LOCALIZATION Right 10/2016   breast lumpectomy with radioactive seed localization and margin assessment for complex sclerosing lesion [facility])  . BREAST LUMPECTOMY WITH RADIOACTIVE SEED LOCALIZATION Right 11/05/2016   Procedure: RIGHT BREAST LUMPECTOMY WITH RADIOACTIVE SEED LOCALIZATION;  Surgeon: Fanny Skates, MD;  Location: [facility];  Service: General;  Laterality: Right;  . CATARACT EXTRACTION  05/2010   left eye  . CERVICAL DISCECTOMY  1992   Fusion (Dr. Joya Salm)  . ESI Bilateral 01/2016   S1 transforaminal ESI x3  . ESOPHAGOGASTRODUODENOSCOPY  01/01/2002   with ulcer, bx. neg; + stricture gastric ulcer with hemorrhage  . ESOPHAGOGASTRODUODENOSCOPY  04/23/2004   Esophageal stricture -- dilated  . nuclear stress test  03/2012   no ischemia  . SKIN CANCER EXCISION  05/20/2011   BCC from nose  . US ECHOCARDIOGRAPHY  03/2012   in flutter, EF 60%, mild-mod aortic, mitral, tricuspid regurg, mildly dilated LA  Current Outpatient Prescriptions  Medication Sig Dispense Refill  . acetaminophen (TYLENOL) 500 MG tablet Take 500 mg by mouth every 6 (six) hours as needed for moderate pain.     Marland Kitchen acyclovir (ZOVIRAX) 400 MG tablet Take 400 mg by mouth daily.      Marland Kitchen atorvastatin (LIPITOR) 20 MG tablet TAKE ONE TABLET EVERY DAY 90 tablet 1  . calcium-vitamin D (OSCAL WITH D) 500-200 MG-UNIT per tablet Take 1 tablet by mouth daily.      . Cholecalciferol (VITAMIN D3) 1000 units  CAPS Take 1 capsule (1,000 Units total) by mouth daily. 30 capsule   . DULoxetine (CYMBALTA) 20 MG capsule Take cymbalta daily for 2 week then increase to 40mg  daily 60 capsule 3  . ELIQUIS 5 MG TABS tablet TAKE ONE TABLET BY MOUTH TWICE DAILY 60 tablet 6  . ferrous sulfate 325 (65 FE) MG tablet Take 1 tablet (325 mg total) by mouth daily with breakfast.    . gabapentin (NEURONTIN) 100 MG capsule Take 2 capsules (200 mg total) by mouth 2 (two) times daily. 120 capsule 3  . metoprolol tartrate (LOPRESSOR) 50 MG tablet Take 1.5 tablets (75 mg total) by mouth 2 (two) times daily. 270 tablet 3  . pantoprazole (PROTONIX) 40 MG tablet TAKE ONE TABLET EVERY DAY 30 tablet 6  . prednisoLONE acetate (PRED FORTE) 1 % ophthalmic suspension Place 1 drop into the left eye daily.     Marland Kitchen rOPINIRole (REQUIP) 1 MG tablet TAKE TWO TABLETS AT BEDTIME 60 tablet 6  . tiZANidine (ZANAFLEX) 4 MG capsule Take 4 mg by mouth 2 (two) times daily.     Marland Kitchen venlafaxine (EFFEXOR) 37.5 MG tablet Take 1 tablet (37.5 mg total) by mouth at bedtime. For 2 weeks then stop 30 tablet 0  . vitamin B-12 (CYANOCOBALAMIN) 1000 MCG tablet Take 1,000 mcg by mouth daily.     No current facility-administered medications for this visit.     Allergies:   Penicillins    Social History:  The patient  reports that she quit smoking about 40 years ago. She has a 0.25 pack-year smoking history. She has never used smokeless tobacco. She reports that she drinks about 1.2 oz of alcohol per week . She reports that she does not use drugs.   Family History:  The patient's family history includes Cancer in her brother; Diabetes in her brother, brother, brother, and sister; Emphysema in her mother; Heart disease in her brother and father; Hypertension in her brother; Stroke in her brother, father, and sister.    ROS:  Please see the history of present illness.   Otherwise, review of systems are positive for none.   All other systems are reviewed and  negative.    PHYSICAL EXAM: VS:  BP (!) 158/90 (BP Location: Left Arm, Patient Position: Sitting, Cuff Size: Normal)   Pulse 99   Ht 5\' 3"  (1.6 m)   Wt 142 lb (64.4 kg)   BMI 25.15 kg/m  , BMI Body mass index is 25.15 kg/m. GEN: Well nourished, well developed, in no acute distress  HEENT: normal  Neck: no JVD, carotid bruits, or masses Cardiac: Irregularly irregular and mildly tachycardic; no murmurs, rubs, or gallops,no edema  Respiratory:  clear to auscultation bilaterally, normal work of breathing GI: soft, nontender, nondistended, + BS MS: no deformity or atrophy  Skin: warm and dry, no rash Neuro:  Strength and sensation are intact Psych: euthymic mood, full affect Normal distal pulses. Femoral pulses normal bilaterally  EKG:  EKG is ordered today. The ekg ordered today demonstrates  atrial fibrillation with ventricular rate of 99 bpm.   Recent Labs: 10/31/2016: ALT 22; BUN 11; Creatinine, Ser 0.86; Hemoglobin 13.5; Platelets 247; Potassium 4.2; Sodium 139    Lipid Panel    Component Value Date/Time   CHOL 195 05/28/2016 1253   TRIG 107.0 05/28/2016 1253   HDL 53.70 05/28/2016 1253   CHOLHDL 4 05/28/2016 1253   VLDL 21.4 05/28/2016 1253   LDLCALC 120 (H) 05/28/2016 1253   LDLDIRECT 173.1 02/19/2010 0908      Wt Readings from Last 3 Encounters:  01/13/17 142 lb (64.4 kg)  01/07/17 139 lb (63 kg)  11/12/16 142 lb 12 oz (64.8 kg)       No flowsheet data found.    ASSESSMENT AND PLAN:  1. Persistental fibrillation:  she continues to be in atrial fibrillation. ventricular rate is more controlled on current dose of metoprolol.  It is highly possible that worsening exertional dyspnea and fatigue are related to atrial fibrillation. Due to that, I have suggested proceeding with cardioversion. She wants to think about this and discuss with her daughters. She is currently on anticoagulation with Eliquis and has not missed any doses.    2. Essential hypertension:  Blood pressure is mildly elevated. I suggested increasing metoprolol to 100 mg twice daily but she is worried about increased fatigue.  3. Hyperlipidemia: Currently on atorvastatin 20 mg once daily with most recent LDL of 75.    Disposition:   FU with me in 2  months  Signed,  [name], MD  01/13/2017 3:11 PM    [place]

## 2017-01-31 NOTE — Anesthesia Postprocedure Evaluation (Signed)
Anesthesia Post Note  [name]  Procedure(s) Performed: CARDIOVERSION (N/A )  Patient location during evaluation: PACU Anesthesia Type: General Level of consciousness: awake and alert and oriented Pain management: pain level controlled Vital Signs Assessment: post-procedure vital signs reviewed and stable Respiratory status: spontaneous breathing Cardiovascular status: blood pressure returned to baseline Anesthetic complications: no     Last Vitals:  Vitals:   01/31/17 0800 01/31/17 0815  BP: 139/78 (!) 144/107  Pulse: 60 60  Resp: 17 12  Temp:    SpO2: 95% 98%    Last Pain:  Vitals:   01/31/17 0746  TempSrc:   PainSc: Asleep                 [name]

## 2017-01-31 NOTE — Discharge Instructions (Signed)
General Anesthesia, Adult, Care After °These instructions provide you with information about caring for yourself after your procedure. Your health care provider may also give you more specific instructions. Your treatment has been planned according to current medical practices, but problems sometimes occur. Call your health care provider if you have any problems or questions after your procedure. °What can I expect after the procedure? °After the procedure, it is common to have: °· Vomiting. °· A sore throat. °· Mental slowness. ° °It is common to feel: °· Nauseous. °· Cold or shivery. °· Sleepy. °· Tired. °· Sore or achy, even in parts of your body where you did not have surgery. ° °Follow these instructions at home: °For at least 24 hours after the procedure: °· Do not: °? Participate in activities where you could fall or become injured. °? Drive. °? Use heavy machinery. °? Drink alcohol. °? Take sleeping pills or medicines that cause drowsiness. °? Make important decisions or sign legal documents. °? Take care of children on your own. °· Rest. °Eating and drinking °· If you vomit, drink water, juice, or soup when you can drink without vomiting. °· Drink enough fluid to keep your urine clear or pale yellow. °· Make sure you have little or no nausea before eating solid foods. °· Follow the diet recommended by your health care provider. °General instructions °· Have a responsible adult stay with you until you are awake and alert. °· Return to your normal activities as told by your health care provider. Ask your health care provider what activities are safe for you. °· Take over-the-counter and prescription medicines only as told by your health care provider. °· If you smoke, do not smoke without supervision. °· Keep all follow-up visits as told by your health care provider. This is important. °Contact a health care provider if: °· You continue to have nausea or vomiting at home, and medicines are not helpful. °· You  cannot drink fluids or start eating again. °· You cannot urinate after 8-12 hours. °· You develop a skin rash. °· You have fever. °· You have increasing redness at the site of your procedure. °Get help right away if: °· You have difficulty breathing. °· You have chest pain. °· You have unexpected bleeding. °· You feel that you are having a life-threatening or urgent problem. °This information is not intended to replace advice given to you by your health care provider. Make sure you discuss any questions you have with your health care provider. °Document Released: 07/01/2000 Document Revised: 08/28/2015 Document Reviewed: 03/09/2015 °Elsevier Interactive Patient Education © 2018 [employer] °Electrical Cardioversion, Care After °This sheet gives you information about how to care for yourself after your procedure. Your health care provider may also give you more specific instructions. If you have problems or questions, contact your health care provider. °What can I expect after the procedure? °After the procedure, it is common to have: °· Some redness on the skin where the shocks were given. ° °Follow these instructions at home: °· Do not drive for 24 hours if you were given a medicine to help you relax (sedative). °· Take over-the-counter and prescription medicines only as told by your health care provider. °· Ask your health care provider how to check your pulse. Check it often. °· Rest for 48 hours after the procedure or as told by your health care provider. °· Avoid or limit your caffeine use as told by your health care provider. °Contact a health care provider if: °·   You feel like your heart is beating too quickly or your pulse is not regular. °· You have a serious muscle cramp that does not go away. °Get help right away if: °· You have discomfort in your chest. °· You are dizzy or you feel faint. °· You have trouble breathing or you are short of breath. °· Your speech is slurred. °· You have trouble moving an  arm or leg on one side of your body. °· Your fingers or toes turn cold or blue. °This information is not intended to replace advice given to you by your health care provider. Make sure you discuss any questions you have with your health care provider. °Document Released: 01/13/2013 Document Revised: 10/27/2015 Document Reviewed: 09/29/2015 °Elsevier Interactive Patient Education © 2018 [employer] ° °

## 2017-01-31 NOTE — Progress Notes (Signed)
Pt sitting up in bed drinking without difficulty in NAD at this time, pt declined offer for food tray.  Dr. Fletcher Anon at bedside earlier to discuss procedure and follow up with pt and family.  Discharge instructions reviewed with pt and family who verbalize understanding.

## 2017-01-31 NOTE — CV Procedure (Signed)
Cardioversion note: A standard informed consent was obtained. Timeout was performed. The pads were placed in the anterior posterior fashion. The patient was given propofol by the anesthesia team.  Successful cardioversion was performed with a 200 J. The patient converted to sinus rhythm. Pre-and post EKGs were reviewed. The patient tolerated the procedure with no immediate complications.  Recommendations: Continue same medications and follow-up in 2-3 weeks.  

## 2017-02-17 ENCOUNTER — Ambulatory Visit: Payer: Medicare Other | Admitting: Cardiovascular Disease

## 2017-02-17 ENCOUNTER — Encounter: Payer: Self-pay | Admitting: Cardiovascular Disease

## 2017-02-17 VITALS — BP 110/80 | HR 94 | Ht 63.0 in | Wt 140.0 lb

## 2017-02-17 DIAGNOSIS — I10 Essential (primary) hypertension: Secondary | ICD-10-CM

## 2017-02-17 DIAGNOSIS — Z01812 Encounter for preprocedural laboratory examination: Secondary | ICD-10-CM | POA: Diagnosis not present

## 2017-02-17 DIAGNOSIS — I481 Persistent atrial fibrillation: Secondary | ICD-10-CM | POA: Diagnosis not present

## 2017-02-17 DIAGNOSIS — E785 Hyperlipidemia, unspecified: Secondary | ICD-10-CM | POA: Diagnosis not present

## 2017-02-17 DIAGNOSIS — I4819 Other persistent atrial fibrillation: Secondary | ICD-10-CM

## 2017-02-17 MED ORDER — AMIODARONE HCL 200 MG PO TABS: ORAL_TABLET | ORAL | 0 refills | Status: DC

## 2017-02-17 MED ORDER — METOPROLOL TARTRATE 50 MG PO TABS: 50.0000 mg | ORAL_TABLET | Freq: Two times a day (BID) | ORAL | 3 refills | Status: DC

## 2017-02-17 NOTE — Progress Notes (Signed)
Cardiology Office Note   Date:  02/17/2017   ID:  [name], DOB July 10, 1929, MRN [id number]  PCP:  Ria Bush, MD  Cardiologist:   [name], MD   Chief Complaint  Patient presents with  . other    Follow up from cardioversion. Meds reviewed by the pt. verbally. Pt. c/o fatigue, shortness of breath with little exertion and is very tired.       History of Present Illness: [name] is a 83 y.o. female who presents for a follow-up visit regarding persistent atrial fibrillation. Nuclear stress test in 03/2012 showed no evidence of ischemia. Echocardiogram in 2013 showed normal LV systolic function, mild to moderate aortic and mitral regurgitation, moderate tricuspid regurgitation with mild pulmonary hypertension.   She went into A. fib with RVR in July in the setting of increased lower back pain. Diltiazem was switched to metoprolol.  She had an echocardiogram done in August which showed normal LV systolic function, mild mitral regurgitation and minimal pulmonary hypertension. She continued to be symptomatic in spite of rate control and thus I proceeded with cardioversion.  She converted to normal sinus rhythm.  She is noted to be back in atrial fibrillation today.  It does not appear that she was in sinus rhythm for long after she was discharged home given that she had shortness of breath and fatigue within 2 days.  Past Medical History:  Diagnosis Date  . Abdominal aortic atherosclerosis ([place]) 09/2015   by xray  . Anxiety   . BCC (basal cell carcinoma of skin) 05/2011   on nose  . Breast lesion    a. diagnosed with complex sclerosing lesion with calcifications of the right breast in 08/2016  . Breast mass, right   . Chest pain    a. nuclear stress test 03/2012: normal  . DDD (degenerative disc disease), lumbosacral 12/06/1992  . Dysrhythmia    a-fib  . Gallstones 09/2015   incidentally by xray  . GERD (gastroesophageal reflux disease) 05/1999  .  History of shingles    ophthalmic, takes acyclovir daily  . Hyperlipidemia 12/2003  . Hypertension 1990  . Mitral regurgitation    a. echo 03/2012: EF 60-65%, no RWMA, mild to mod AI/MR, mod TR, PASP 37 mmHg  . Osteoarthritis 08/21/1989  . Osteoporosis with fracture 11/1997   with compression fracture T12  . PAF (paroxysmal atrial fibrillation) ([place]) 03/16/2012   a. on Eliquis; b. CHADS2VASc => 5 (HTN, age x 2, vascular disease, sex category)  . Rheumatoid arthritis ([place])   . RLS (restless legs syndrome)     Past Surgical History:  Procedure Laterality Date  . ABDOMINAL HYSTERECTOMY  Age 9 - 86   S/P TAH  . abdominal ultrasound  04/23/2004   Gallstones  . BREAST LUMPECTOMY WITH RADIOACTIVE SEED LOCALIZATION Right 10/2016   breast lumpectomy with radioactive seed localization and margin assessment for complex sclerosing lesion [facility])  . CATARACT EXTRACTION  05/2010   left eye  . CERVICAL DISCECTOMY  1992   Fusion (Dr. Joya Salm)  . ESI Bilateral 01/2016   S1 transforaminal ESI x3  . ESOPHAGOGASTRODUODENOSCOPY  01/01/2002   with ulcer, bx. neg; + stricture gastric ulcer with hemorrhage  . ESOPHAGOGASTRODUODENOSCOPY  04/23/2004   Esophageal stricture -- dilated  . nuclear stress test  03/2012   no ischemia  . SKIN CANCER EXCISION  05/20/2011   BCC from nose  . US ECHOCARDIOGRAPHY  03/2012   in flutter, EF 60%, mild-mod aortic, mitral,  tricuspid regurg, mildly dilated LA     Current Outpatient Medications  Medication Sig Dispense Refill  . acetaminophen (TYLENOL) 500 MG tablet Take 500 mg by mouth every 6 (six) hours as needed for moderate pain.     Marland Kitchen acyclovir (ZOVIRAX) 400 MG tablet Take 400 mg by mouth daily.      Marland Kitchen atorvastatin (LIPITOR) 20 MG tablet TAKE ONE TABLET EVERY DAY 90 tablet 1  . calcium-vitamin D (OSCAL WITH D) 500-200 MG-UNIT per tablet Take 1 tablet by mouth daily.      . Cholecalciferol (VITAMIN D3) 1000 units CAPS Take 1 capsule (1,000 Units total) by  mouth daily. 30 capsule   . DULoxetine (CYMBALTA) 20 MG capsule Take cymbalta daily for 2 week then increase to 40mg  daily (Patient taking differently: Take 20 mg by mouth 2 (two) times daily. ) 60 capsule 3  . ELIQUIS 5 MG TABS tablet TAKE ONE TABLET BY MOUTH TWICE DAILY 60 tablet 6  . ferrous sulfate 325 (65 FE) MG tablet Take 1 tablet (325 mg total) by mouth daily with breakfast.    . gabapentin (NEURONTIN) 100 MG capsule Take 2 capsules (200 mg total) by mouth 2 (two) times daily. (Patient taking differently: Take 100 mg by mouth 2 (two) times daily as needed (back pain). ) 120 capsule 3  . metoprolol tartrate (LOPRESSOR) 50 MG tablet Take 1.5 tablets (75 mg total) by mouth 2 (two) times daily. 270 tablet 3  . pantoprazole (PROTONIX) 40 MG tablet TAKE ONE TABLET EVERY DAY 30 tablet 6  . prednisoLONE acetate (PRED FORTE) 1 % ophthalmic suspension Place 1 drop into the left eye daily.     Marland Kitchen rOPINIRole (REQUIP) 1 MG tablet TAKE TWO TABLETS AT BEDTIME 60 tablet 6  . tiZANidine (ZANAFLEX) 4 MG capsule Take 4 mg by mouth 2 (two) times daily.     Marland Kitchen venlafaxine (EFFEXOR) 37.5 MG tablet Take 1 tablet (37.5 mg total) by mouth at bedtime. For 2 weeks then stop 30 tablet 0  . vitamin B-12 (CYANOCOBALAMIN) 1000 MCG tablet Take 1,000 mcg by mouth daily.     No current facility-administered medications for this visit.     Allergies:   Penicillins    Social History:  The patient  reports that she quit smoking about 40 years ago. She has a 0.25 pack-year smoking history. she has never used smokeless tobacco. She reports that she drinks about 1.2 oz of alcohol per week. She reports that she does not use drugs.   Family History:  The patient's family history includes Cancer in her brother; Diabetes in her brother, brother, brother, and sister; Emphysema in her mother; Heart disease in her brother and father; Hypertension in her brother; Stroke in her brother, father, and sister.    ROS:  Please see the  history of present illness.   Otherwise, review of systems are positive for none.   All other systems are reviewed and negative.    PHYSICAL EXAM: VS:  BP 110/80 (BP Location: Left Arm, Patient Position: Sitting, Cuff Size: Large)   Pulse 94   Ht 5\' 3"  (1.6 m)   Wt 140 lb (63.5 kg)   BMI 24.80 kg/m  , BMI Body mass index is 24.8 kg/m. GEN: Well nourished, well developed, in no acute distress  HEENT: normal  Neck: no JVD, carotid bruits, or masses Cardiac: Irregularly irregular and mildly tachycardic; no murmurs, rubs, or gallops,no edema  Respiratory:  clear to auscultation bilaterally, normal work of breathing GI:  soft, nontender, nondistended, + BS MS: no deformity or atrophy  Skin: warm and dry, no rash Neuro:  Strength and sensation are intact Psych: euthymic mood, full affect  EKG:  EKG is ordered today. The ekg ordered today demonstrates  atrial fibrillation with ventricular rate of 94 bpm.   Recent Labs: 10/31/2016: ALT 22 01/29/2017: BUN 11; Creatinine, Ser 1.01; Hemoglobin 13.9; Platelets 305; Potassium 3.9; Sodium 142    Lipid Panel    Component Value Date/Time   CHOL 195 05/28/2016 1253   TRIG 107.0 05/28/2016 1253   HDL 53.70 05/28/2016 1253   CHOLHDL 4 05/28/2016 1253   VLDL 21.4 05/28/2016 1253   LDLCALC 120 (H) 05/28/2016 1253   LDLDIRECT 173.1 02/19/2010 0908      Wt Readings from Last 3 Encounters:  02/17/17 140 lb (63.5 kg)  01/31/17 142 lb (64.4 kg)  01/13/17 142 lb (64.4 kg)       No flowsheet data found.    ASSESSMENT AND PLAN:  1. Persistental atrial fibrillation:   The patient underwent recent successful cardioversion.  However, she is noted to be back in atrial fibrillation today and continues to be very symptomatic.  I discussed with her different management options in order to try to maintain sinus rhythm, an antiarrhythmic medication is needed.  Considering her age, I think amiodarone is probably the best option.  I elected to start  amiodarone at 400 mg twice daily for 5 days followed by 200 mg twice daily.  I decreased metoprolol to 50 mg twice daily.  Continue anticoagulation with Eliquis.  I discussed with her the option of repeat cardioversion after loading with amiodarone we will plan on doing that in 2 weeks from now.  She is aware of the risks and benefits.   2. Essential hypertension: Blood pressure is reasonably controlled.  3. Hyperlipidemia: Currently on atorvastatin 20 mg once daily with most recent LDL of 75.    Disposition:   FU with me in 1  months  Signed,  [name], MD  02/17/2017 3:57 PM    [place]

## 2017-02-17 NOTE — H&P (View-Only) (Signed)
Cardiology Office Note   Date:  02/17/2017   ID:  [name], DOB 15-Jul-1929, MRN [id number]  PCP:  Ria Bush, MD  Cardiologist:   [name], MD   Chief Complaint  Patient presents with  . other    Follow up from cardioversion. Meds reviewed by the pt. verbally. Pt. c/o fatigue, shortness of breath with little exertion and is very tired.       History of Present Illness: [name] is a 83 y.o. female who presents for a follow-up visit regarding persistent atrial fibrillation. Nuclear stress test in 03/2012 showed no evidence of ischemia. Echocardiogram in 2013 showed normal LV systolic function, mild to moderate aortic and mitral regurgitation, moderate tricuspid regurgitation with mild pulmonary hypertension.   She went into A. fib with RVR in July in the setting of increased lower back pain. Diltiazem was switched to metoprolol.  She had an echocardiogram done in August which showed normal LV systolic function, mild mitral regurgitation and minimal pulmonary hypertension. She continued to be symptomatic in spite of rate control and thus I proceeded with cardioversion.  She converted to normal sinus rhythm.  She is noted to be back in atrial fibrillation today.  It does not appear that she was in sinus rhythm for long after she was discharged home given that she had shortness of breath and fatigue within 2 days.  Past Medical History:  Diagnosis Date  . Abdominal aortic atherosclerosis ([place]) 09/2015   by xray  . Anxiety   . BCC (basal cell carcinoma of skin) 05/2011   on nose  . Breast lesion    a. diagnosed with complex sclerosing lesion with calcifications of the right breast in 08/2016  . Breast mass, right   . Chest pain    a. nuclear stress test 03/2012: normal  . DDD (degenerative disc disease), lumbosacral 12/06/1992  . Dysrhythmia    a-fib  . Gallstones 09/2015   incidentally by xray  . GERD (gastroesophageal reflux disease) 05/1999  .  History of shingles    ophthalmic, takes acyclovir daily  . Hyperlipidemia 12/2003  . Hypertension 1990  . Mitral regurgitation    a. echo 03/2012: EF 60-65%, no RWMA, mild to mod AI/MR, mod TR, PASP 37 mmHg  . Osteoarthritis 08/21/1989  . Osteoporosis with fracture 11/1997   with compression fracture T12  . PAF (paroxysmal atrial fibrillation) ([place]) 03/16/2012   a. on Eliquis; b. CHADS2VASc => 5 (HTN, age x 2, vascular disease, sex category)  . Rheumatoid arthritis ([place])   . RLS (restless legs syndrome)     Past Surgical History:  Procedure Laterality Date  . ABDOMINAL HYSTERECTOMY  Age 12 - 35   S/P TAH  . abdominal ultrasound  04/23/2004   Gallstones  . BREAST LUMPECTOMY WITH RADIOACTIVE SEED LOCALIZATION Right 10/2016   breast lumpectomy with radioactive seed localization and margin assessment for complex sclerosing lesion [facility])  . CATARACT EXTRACTION  05/2010   left eye  . CERVICAL DISCECTOMY  1992   Fusion (Dr. Joya Salm)  . ESI Bilateral 01/2016   S1 transforaminal ESI x3  . ESOPHAGOGASTRODUODENOSCOPY  01/01/2002   with ulcer, bx. neg; + stricture gastric ulcer with hemorrhage  . ESOPHAGOGASTRODUODENOSCOPY  04/23/2004   Esophageal stricture -- dilated  . nuclear stress test  03/2012   no ischemia  . SKIN CANCER EXCISION  05/20/2011   BCC from nose  . US ECHOCARDIOGRAPHY  03/2012   in flutter, EF 60%, mild-mod aortic, mitral,  tricuspid regurg, mildly dilated LA     Current Outpatient Medications  Medication Sig Dispense Refill  . acetaminophen (TYLENOL) 500 MG tablet Take 500 mg by mouth every 6 (six) hours as needed for moderate pain.     Marland Kitchen acyclovir (ZOVIRAX) 400 MG tablet Take 400 mg by mouth daily.      Marland Kitchen atorvastatin (LIPITOR) 20 MG tablet TAKE ONE TABLET EVERY DAY 90 tablet 1  . calcium-vitamin D (OSCAL WITH D) 500-200 MG-UNIT per tablet Take 1 tablet by mouth daily.      . Cholecalciferol (VITAMIN D3) 1000 units CAPS Take 1 capsule (1,000 Units total) by  mouth daily. 30 capsule   . DULoxetine (CYMBALTA) 20 MG capsule Take cymbalta daily for 2 week then increase to 40mg  daily (Patient taking differently: Take 20 mg by mouth 2 (two) times daily. ) 60 capsule 3  . ELIQUIS 5 MG TABS tablet TAKE ONE TABLET BY MOUTH TWICE DAILY 60 tablet 6  . ferrous sulfate 325 (65 FE) MG tablet Take 1 tablet (325 mg total) by mouth daily with breakfast.    . gabapentin (NEURONTIN) 100 MG capsule Take 2 capsules (200 mg total) by mouth 2 (two) times daily. (Patient taking differently: Take 100 mg by mouth 2 (two) times daily as needed (back pain). ) 120 capsule 3  . metoprolol tartrate (LOPRESSOR) 50 MG tablet Take 1.5 tablets (75 mg total) by mouth 2 (two) times daily. 270 tablet 3  . pantoprazole (PROTONIX) 40 MG tablet TAKE ONE TABLET EVERY DAY 30 tablet 6  . prednisoLONE acetate (PRED FORTE) 1 % ophthalmic suspension Place 1 drop into the left eye daily.     Marland Kitchen rOPINIRole (REQUIP) 1 MG tablet TAKE TWO TABLETS AT BEDTIME 60 tablet 6  . tiZANidine (ZANAFLEX) 4 MG capsule Take 4 mg by mouth 2 (two) times daily.     Marland Kitchen venlafaxine (EFFEXOR) 37.5 MG tablet Take 1 tablet (37.5 mg total) by mouth at bedtime. For 2 weeks then stop 30 tablet 0  . vitamin B-12 (CYANOCOBALAMIN) 1000 MCG tablet Take 1,000 mcg by mouth daily.     No current facility-administered medications for this visit.     Allergies:   Penicillins    Social History:  The patient  reports that she quit smoking about 40 years ago. She has a 0.25 pack-year smoking history. she has never used smokeless tobacco. She reports that she drinks about 1.2 oz of alcohol per week. She reports that she does not use drugs.   Family History:  The patient's family history includes Cancer in her brother; Diabetes in her brother, brother, brother, and sister; Emphysema in her mother; Heart disease in her brother and father; Hypertension in her brother; Stroke in her brother, father, and sister.    ROS:  Please see the  history of present illness.   Otherwise, review of systems are positive for none.   All other systems are reviewed and negative.    PHYSICAL EXAM: VS:  BP 110/80 (BP Location: Left Arm, Patient Position: Sitting, Cuff Size: Large)   Pulse 94   Ht 5\' 3"  (1.6 m)   Wt 140 lb (63.5 kg)   BMI 24.80 kg/m  , BMI Body mass index is 24.8 kg/m. GEN: Well nourished, well developed, in no acute distress  HEENT: normal  Neck: no JVD, carotid bruits, or masses Cardiac: Irregularly irregular and mildly tachycardic; no murmurs, rubs, or gallops,no edema  Respiratory:  clear to auscultation bilaterally, normal work of breathing GI:  soft, nontender, nondistended, + BS MS: no deformity or atrophy  Skin: warm and dry, no rash Neuro:  Strength and sensation are intact Psych: euthymic mood, full affect  EKG:  EKG is ordered today. The ekg ordered today demonstrates  atrial fibrillation with ventricular rate of 94 bpm.   Recent Labs: 10/31/2016: ALT 22 01/29/2017: BUN 11; Creatinine, Ser 1.01; Hemoglobin 13.9; Platelets 305; Potassium 3.9; Sodium 142    Lipid Panel    Component Value Date/Time   CHOL 195 05/28/2016 1253   TRIG 107.0 05/28/2016 1253   HDL 53.70 05/28/2016 1253   CHOLHDL 4 05/28/2016 1253   VLDL 21.4 05/28/2016 1253   LDLCALC 120 (H) 05/28/2016 1253   LDLDIRECT 173.1 02/19/2010 0908      Wt Readings from Last 3 Encounters:  02/17/17 140 lb (63.5 kg)  01/31/17 142 lb (64.4 kg)  01/13/17 142 lb (64.4 kg)       No flowsheet data found.    ASSESSMENT AND PLAN:  1. Persistental atrial fibrillation:   The patient underwent recent successful cardioversion.  However, she is noted to be back in atrial fibrillation today and continues to be very symptomatic.  I discussed with her different management options in order to try to maintain sinus rhythm, an antiarrhythmic medication is needed.  Considering her age, I think amiodarone is probably the best option.  I elected to start  amiodarone at 400 mg twice daily for 5 days followed by 200 mg twice daily.  I decreased metoprolol to 50 mg twice daily.  Continue anticoagulation with Eliquis.  I discussed with her the option of repeat cardioversion after loading with amiodarone we will plan on doing that in 2 weeks from now.  She is aware of the risks and benefits.   2. Essential hypertension: Blood pressure is reasonably controlled.  3. Hyperlipidemia: Currently on atorvastatin 20 mg once daily with most recent LDL of 75.    Disposition:   FU with me in 1  months  Signed,  [name], MD  02/17/2017 3:57 PM    [place]

## 2017-02-17 NOTE — Patient Instructions (Addendum)
Medication Instructions:  Your physician has recommended you make the following change in your medication:  START taking amiodarone 400mg  (2 tablets) twice a day for 5 days then decrease to 200mg  (1 tablet) twice daily thereafter DECREASE metoprolol to 50mg  twice daily   Labwork: BMET, CBC, PT/INR by November 20 at the [facility] lab. No appt needed.   Testing/Procedures: Your physician has recommended that you have a Cardioversion (DCCV). Electrical Cardioversion uses a jolt of electricity to your heart either through paddles or wired patches attached to your chest. This is a controlled, usually prescheduled, procedure. Defibrillation is done under light anesthesia in the hospital, and you usually go home the day of the procedure. This is done to get your heart back into a normal rhythm. You are not awake for the procedure. Please see the instruction sheet given to you today.  Monday, November 26 Arrival time 6:30am at the [facility]. Nothing to eat or drink after midnight the evening before your procedure. You should take your morning medications with a sip of water.  Please have someone with you to drive you home.     Follow-Up: Your physician recommends that you schedule a follow-up appointment in: 1 month with Dr. Fletcher Anon.    Any Other Special Instructions Will Be Listed Below (If Applicable).     If you need a refill on your cardiac medications before your next appointment, please call your pharmacy.   Electrical Cardioversion Electrical cardioversion is the delivery of a jolt of electricity to restore a normal rhythm to the heart. A rhythm that is too fast or is not regular keeps the heart from pumping well. In this procedure, sticky patches or metal paddles are placed on the chest to deliver electricity to the heart from a device. This procedure may be done in an emergency if:  There is low or no blood pressure as a result of the heart rhythm.  Normal rhythm must be  restored as fast as possible to protect the brain and heart from further damage.  It may save a life.  This procedure may also be done for irregular or fast heart rhythms that are not immediately life-threatening. Tell a health care provider about:  Any allergies you have.  All medicines you are taking, including vitamins, herbs, eye drops, creams, and over-the-counter medicines.  Any problems you or family members have had with anesthetic medicines.  Any blood disorders you have.  Any surgeries you have had.  Any medical conditions you have.  Whether you are pregnant or may be pregnant. What are the risks? Generally, this is a safe procedure. However, problems may occur, including:  Allergic reactions to medicines.  A blood clot that breaks free and travels to other parts of your body.  The possible return of an abnormal heart rhythm within hours or days after the procedure.  Your heart stopping (cardiac arrest). This is rare.  What happens before the procedure? Medicines  Your health care provider may have you start taking: ? Blood-thinning medicines (anticoagulants) so your blood does not clot as easily. ? Medicines may be given to help stabilize your heart rate and rhythm.  Ask your health care provider about changing or stopping your regular medicines. This is especially important if you are taking diabetes medicines or blood thinners. General instructions  Plan to have someone take you home from the hospital or clinic.  If you will be going home right after the procedure, plan to have someone with you for 24  hours.  Follow instructions from your health care provider about eating or drinking restrictions. What happens during the procedure?  To lower your risk of infection: ? Your health care team will wash or sanitize their hands. ? Your skin will be washed with soap.  An IV tube will be inserted into one of your veins.  You will be given a medicine to help  you relax (sedative).  Sticky patches (electrodes) or metal paddles may be placed on your chest.  An electrical shock will be delivered. The procedure may vary among health care providers and hospitals. What happens after the procedure?  Your blood pressure, heart rate, breathing rate, and blood oxygen level will be monitored until the medicines you were given have worn off.  Do not drive for 24 hours if you were given a sedative.  Your heart rhythm will be watched to make sure it does not change. This information is not intended to replace advice given to you by your health care provider. Make sure you discuss any questions you have with your health care provider. Document Released: 03/15/2002 Document Revised: 11/22/2015 Document Reviewed: 09/29/2015 Elsevier Interactive Patient Education  2017 [employer]. Amiodarone tablets What is this medicine? AMIODARONE (a MEE oh da rone) is an antiarrhythmic drug. It helps make your heart beat regularly. Because of the side effects caused by this medicine, it is only used when other medicines have not worked. It is usually used for heartbeat problems that may be life threatening. This medicine may be used for other purposes; ask your health care provider or pharmacist if you have questions. COMMON BRAND NAME(S): Cordarone, Pacerone What should I tell my health care provider before I take this medicine? They need to know if you have any of these conditions: -liver disease -lung disease -other heart problems -thyroid disease -an unusual or allergic reaction to amiodarone, iodine, other medicines, foods, dyes, or preservatives -pregnant or trying to get pregnant -breast-feeding How should I use this medicine? Take this medicine by mouth with a glass of water. Follow the directions on the prescription label. You can take this medicine with or without food. However, you should always take it the same way each time. Take your doses at regular  intervals. Do not take your medicine more often than directed. Do not stop taking except on the advice of your doctor or health care professional. A special MedGuide will be given to you by the pharmacist with each prescription and refill. Be sure to read this information carefully each time. Talk to your pediatrician regarding the use of this medicine in children. Special care may be needed. Overdosage: If you think you have taken too much of this medicine contact a poison control center or emergency room at once. NOTE: This medicine is only for you. Do not share this medicine with others. What if I miss a dose? If you miss a dose, take it as soon as you can. If it is almost time for your next dose, take only that dose. Do not take double or extra doses. What may interact with this medicine? Do not take this medicine with any of the following medications: -abarelix -apomorphine -arsenic trioxide -certain antibiotics like erythromycin, gemifloxacin, levofloxacin, pentamidine -certain medicines for depression like amoxapine, tricyclic antidepressants -certain medicines for fungal infections like fluconazole, itraconazole, ketoconazole, posaconazole, voriconazole -certain medicines for irregular heart beat like disopyramide, dofetilide, dronedarone, ibutilide, propafenone, sotalol -certain medicines for malaria like chloroquine, halofantrine -cisapride -droperidol -haloperidol -hawthorn -maprotiline -methadone -phenothiazines like chlorpromazine,  mesoridazine, thioridazine -pimozide -ranolazine -red yeast rice -vardenafil -ziprasidone This medicine may also interact with the following medications: -antiviral medicines for HIV or AIDS -certain medicines for blood pressure, heart disease, irregular heart beat -certain medicines for cholesterol like atorvastatin, cerivastatin, lovastatin, simvastatin -certain medicines for hepatitis C like sofosbuvir and ledipasvir; sofosbuvir -certain  medicines for seizures like phenytoin -certain medicines for thyroid problems -certain medicines that treat or prevent blood clots like warfarin -cholestyramine -cimetidine -clopidogrel -cyclosporine -dextromethorphan -diuretics -fentanyl -general anesthetics -grapefruit juice -lidocaine -loratadine -methotrexate -other medicines that prolong the QT interval (cause an abnormal heart rhythm) -procainamide -quinidine -rifabutin, rifampin, or rifapentine -St. John's Wort -trazodone This list may not describe all possible interactions. Give your health care provider a list of all the medicines, herbs, non-prescription drugs, or dietary supplements you use. Also tell them if you smoke, drink alcohol, or use illegal drugs. Some items may interact with your medicine. What should I watch for while using this medicine? Your condition will be monitored closely when you first begin therapy. Often, this drug is first started in a hospital or other monitored health care setting. Once you are on maintenance therapy, visit your doctor or health care professional for regular checks on your progress. Because your condition and use of this medicine carry some risk, it is a good idea to carry an identification card, necklace or bracelet with details of your condition, medications, and doctor or health care professional. Dennis Bast may get drowsy or dizzy. Do not drive, use machinery, or do anything that needs mental alertness until you know how this medicine affects you. Do not stand or sit up quickly, especially if you are an older patient. This reduces the risk of dizzy or fainting spells. This medicine can make you more sensitive to the sun. Keep out of the sun. If you cannot avoid being in the sun, wear protective clothing and use sunscreen. Do not use sun lamps or tanning beds/booths. You should have regular eye exams before and during treatment. Call your doctor if you have blurred vision, see halos, or your  eyes become sensitive to light. Your eyes may get dry. It may be helpful to use a lubricating eye solution or artificial tears solution. If you are going to have surgery or a procedure that requires contrast dyes, tell your doctor or health care professional that you are taking this medicine. What side effects may I notice from receiving this medicine? Side effects that you should report to your doctor or health care professional as soon as possible: -allergic reactions like skin rash, itching or hives, swelling of the face, lips, or tongue -blue-gray coloring of the skin -blurred vision, seeing blue green halos, increased sensitivity of the eyes to light -breathing problems -chest pain -dark urine -fast, irregular heartbeat -feeling faint or light-headed -intolerance to heat or cold -nausea or vomiting -pain and swelling of the scrotum -pain, tingling, numbness in feet, hands -redness, blistering, peeling or loosening of the skin, including inside the mouth -spitting up blood -stomach pain -sweating -unusual or uncontrolled movements of body -unusually weak or tired -weight gain or loss -yellowing of the eyes or skin Side effects that usually do not require medical attention (report to your doctor or health care professional if they continue or are bothersome): -change in sex drive or performance -constipation -dizziness -headache -loss of appetite -trouble sleeping This list may not describe all possible side effects. Call your doctor for medical advice about side effects. You may report side effects to FDA  at 1-800-FDA-1088. Where should I keep my medicine? Keep out of the reach of children. Store at room temperature between 20 and 25 degrees C (68 and 77 degrees F). Protect from light. Keep container tightly closed. Throw away any unused medicine after the expiration date. NOTE: This sheet is a summary. It may not cover all possible information. If you have questions about this  medicine, talk to your doctor, pharmacist, or health care provider.  2018 Elsevier/Gold Standard (2013-06-28 19:48:11)

## 2017-02-18 ENCOUNTER — Ambulatory Visit: Payer: Medicare Other | Admitting: Family Medicine

## 2017-02-18 ENCOUNTER — Telehealth: Payer: Self-pay | Admitting: *Deleted

## 2017-02-18 NOTE — Telephone Encounter (Signed)
today's visit was a depression f/u after cymbalta commencement. Would call to see how she's doing on new medicine, offer f/u in office if pt desires.  No need to charge late cancellation fee.

## 2017-02-18 NOTE — Telephone Encounter (Signed)
Copied from [place]. Topic: Quick Communication - Appointment Cancellation >> Feb 18, 2017 10:48 AM Ether Griffins B wrote: Patient called to cancel appointment scheduled for 02/18/17. Patient HAS NOT rescheduled their appointment.    Route to department's PEC pool.

## 2017-02-18 NOTE — Telephone Encounter (Signed)
Please advised if patient should be charged a late cancellation fee? Patient did not reschedule appointment. Please let front office know if they need to call patient and reschedule the appointment.

## 2017-02-19 NOTE — Telephone Encounter (Signed)
Left message on vm per dpr relaying message per Dr. G.  

## 2017-02-20 NOTE — Addendum Note (Signed)
Addended by: Anselm Pancoast on: 02/20/2017 11:52 AM   Modules accepted: Orders

## 2017-02-25 ENCOUNTER — Other Ambulatory Visit
Admission: RE | Admit: 2017-02-25 | Discharge: 2017-02-25 | Disposition: A | Discharge status: Home / Self Care | Payer: Medicare Other | Source: Ambulatory Visit | Attending: Cardiovascular Disease | Admitting: Cardiovascular Disease

## 2017-02-25 DIAGNOSIS — Z01812 Encounter for preprocedural laboratory examination: Secondary | ICD-10-CM | POA: Diagnosis not present

## 2017-02-25 LAB — BASIC METABOLIC PANEL
Anion gap: 11 (ref 5–15)
BUN: 14 mg/dL (ref 6–20)
CO2: 27 mmol/L (ref 22–32)
Calcium: 9.7 mg/dL (ref 8.9–10.3)
Chloride: 98 mmol/L — ABNORMAL LOW (ref 101–111)
Creatinine, Ser: 0.93 mg/dL (ref 0.44–1.00)
GFR calc Af Amer: >60 mL/min (ref >60)
GFR, EST NON AFRICAN AMERICAN: 54 mL/min — AB (ref >60)
Glucose, Bld: 119 mg/dL — ABNORMAL HIGH (ref 65–99)
POTASSIUM: 3.5 mmol/L (ref 3.5–5.1)
SODIUM: 136 mmol/L (ref 135–145)

## 2017-02-25 LAB — CBC
HEMATOCRIT: 41.7 % (ref 35.0–47.0)
HEMOGLOBIN: 13.9 g/dL (ref 12.0–16.0)
MCH: 31.3 pg (ref 26.0–34.0)
MCHC: 33.3 g/dL (ref 32.0–36.0)
MCV: 94 fL (ref 80.0–100.0)
Platelets: 235 10*3/uL (ref 150–440)
RBC: 4.44 MIL/uL (ref 3.80–5.20)
RDW: 14.2 % (ref 11.5–14.5)
WBC: 8.2 10*3/uL (ref 3.6–11.0)

## 2017-02-25 LAB — PROTIME-INR
INR: 1.33
Prothrombin Time: 16.4 seconds — ABNORMAL HIGH (ref 11.4–15.2)

## 2017-03-03 ENCOUNTER — Encounter: Payer: Self-pay | Admitting: *Deleted

## 2017-03-03 ENCOUNTER — Encounter
Admission: RE | Disposition: A | Discharge status: Home / Self Care | Payer: Self-pay | Source: Ambulatory Visit | Attending: Cardiovascular Disease

## 2017-03-03 ENCOUNTER — Ambulatory Visit: Payer: Medicare Other | Admitting: Anesthesiology

## 2017-03-03 ENCOUNTER — Ambulatory Visit
Admission: RE | Admit: 2017-03-03 | Discharge: 2017-03-03 | Disposition: A | Discharge status: Home / Self Care | Payer: Medicare Other | Source: Ambulatory Visit | Attending: Cardiovascular Disease | Admitting: Cardiovascular Disease

## 2017-03-03 DIAGNOSIS — I10 Essential (primary) hypertension: Secondary | ICD-10-CM | POA: Diagnosis not present

## 2017-03-03 DIAGNOSIS — K219 Gastro-esophageal reflux disease without esophagitis: Secondary | ICD-10-CM | POA: Insufficient documentation

## 2017-03-03 DIAGNOSIS — M5137 Other intervertebral disc degeneration, lumbosacral region: Secondary | ICD-10-CM | POA: Insufficient documentation

## 2017-03-03 DIAGNOSIS — E785 Hyperlipidemia, unspecified: Secondary | ICD-10-CM | POA: Diagnosis not present

## 2017-03-03 DIAGNOSIS — M810 Age-related osteoporosis without current pathological fracture: Secondary | ICD-10-CM | POA: Diagnosis not present

## 2017-03-03 DIAGNOSIS — Z880 Allergy status to penicillin: Secondary | ICD-10-CM | POA: Insufficient documentation

## 2017-03-03 DIAGNOSIS — Z8249 Family history of ischemic heart disease and other diseases of the circulatory system: Secondary | ICD-10-CM | POA: Insufficient documentation

## 2017-03-03 DIAGNOSIS — G2581 Restless legs syndrome: Secondary | ICD-10-CM | POA: Insufficient documentation

## 2017-03-03 DIAGNOSIS — F419 Anxiety disorder, unspecified: Secondary | ICD-10-CM | POA: Diagnosis not present

## 2017-03-03 DIAGNOSIS — F329 Major depressive disorder, single episode, unspecified: Secondary | ICD-10-CM | POA: Diagnosis not present

## 2017-03-03 DIAGNOSIS — I083 Combined rheumatic disorders of mitral, aortic and tricuspid valves: Secondary | ICD-10-CM | POA: Insufficient documentation

## 2017-03-03 DIAGNOSIS — Z87891 Personal history of nicotine dependence: Secondary | ICD-10-CM | POA: Insufficient documentation

## 2017-03-03 DIAGNOSIS — Z87311 Personal history of (healed) other pathological fracture: Secondary | ICD-10-CM | POA: Insufficient documentation

## 2017-03-03 DIAGNOSIS — I481 Persistent atrial fibrillation: Secondary | ICD-10-CM

## 2017-03-03 DIAGNOSIS — I739 Peripheral vascular disease, unspecified: Secondary | ICD-10-CM | POA: Insufficient documentation

## 2017-03-03 DIAGNOSIS — Z7901 Long term (current) use of anticoagulants: Secondary | ICD-10-CM | POA: Insufficient documentation

## 2017-03-03 DIAGNOSIS — M069 Rheumatoid arthritis, unspecified: Secondary | ICD-10-CM | POA: Diagnosis not present

## 2017-03-03 DIAGNOSIS — I4891 Unspecified atrial fibrillation: Secondary | ICD-10-CM | POA: Diagnosis not present

## 2017-03-03 HISTORY — PX: CARDIOVERSION: EP1203

## 2017-03-03 SURGERY — CARDIOVERSION (CATH LAB)
Anesthesia: General

## 2017-03-03 MED ORDER — ONDANSETRON HCL 4 MG/2ML IJ SOLN: 4.0000 mg | Freq: Once | INTRAMUSCULAR | Status: DC | PRN

## 2017-03-03 MED ORDER — PROPOFOL 10 MG/ML IV BOLUS
INTRAVENOUS | Status: DC | PRN
  Administered 2017-03-03: 60 mg via INTRAVENOUS

## 2017-03-03 MED ORDER — PROPOFOL 500 MG/50ML IV EMUL
INTRAVENOUS | Status: AC
  Filled 2017-03-03: qty 50

## 2017-03-03 MED ORDER — PROPOFOL 10 MG/ML IV BOLUS
INTRAVENOUS | Status: AC
  Filled 2017-03-03: qty 20

## 2017-03-03 MED ORDER — AMIODARONE HCL 200 MG PO TABS: ORAL_TABLET | ORAL | 3 refills | Status: DC

## 2017-03-03 MED ORDER — PHENYLEPHRINE HCL 10 MG/ML IJ SOLN
INTRAMUSCULAR | Status: AC
  Filled 2017-03-03: qty 1

## 2017-03-03 MED ORDER — FENTANYL CITRATE (PF) 100 MCG/2ML IJ SOLN: 25.0000 ug | INTRAMUSCULAR | Status: DC | PRN

## 2017-03-03 MED ORDER — SODIUM CHLORIDE 0.9 % IV SOLN
INTRAVENOUS | Status: DC
  Administered 2017-03-03: 07:00:00 via INTRAVENOUS

## 2017-03-03 MED ORDER — EPHEDRINE SULFATE 50 MG/ML IJ SOLN
INTRAMUSCULAR | Status: AC
  Filled 2017-03-03: qty 1

## 2017-03-03 NOTE — Progress Notes (Signed)
Pt sitting up in bed drinking w/o difficulty in NAD, declined offer for food, family at bedside.  Dr. Fletcher Anon at bedside earlier to discuss procedure and followup with pt and family.  Discharge and follow up instructions reviewed with pt and family who verbalize understanding.

## 2017-03-03 NOTE — Transfer of Care (Signed)
Immediate Anesthesia Transfer of Care Note  [name]  Procedure(s) Performed: CARDIOVERSION (N/A )  Patient Location: PACU  Anesthesia Type:General  Level of Consciousness: awake, alert  and oriented  Airway & Oxygen Therapy: Patient Spontanous Breathing and Patient connected to nasal cannula oxygen  Post-op Assessment: Report given to RN and Post -op Vital signs reviewed and stable  Post vital signs: Reviewed and stable  Last Vitals:  Vitals:   03/03/17 0753 03/03/17 0754  BP:  (!) 133/58  Pulse: (!) 43 (!) 48  Resp: 18 16  Temp:    SpO2: 94% 97%    Last Pain: There were no vitals filed for this visit.       Complications: No apparent anesthesia complications

## 2017-03-03 NOTE — Interval H&P Note (Signed)
History and Physical Interval Note:  03/03/2017 8:38 AM  [name]  has presented today for surgery, with the diagnosis of Cardioversion   Afib  The various methods of treatment have been discussed with the patient and family. After consideration of risks, benefits and other options for treatment, the patient has consented to  Procedure(s): CARDIOVERSION (N/A) as a surgical intervention .  The patient's history has been reviewed, patient examined, no change in status, stable for surgery.  I have reviewed the patient's chart and labs.  Questions were answered to the patient's satisfaction.     [name]

## 2017-03-03 NOTE — Anesthesia Postprocedure Evaluation (Signed)
Anesthesia Post Note  [name]  Procedure(s) Performed: CARDIOVERSION (N/A )  Patient location during evaluation: PACU Anesthesia Type: General Level of consciousness: awake Pain management: pain level controlled Vital Signs Assessment: post-procedure vital signs reviewed and stable Respiratory status: nonlabored ventilation Cardiovascular status: stable Anesthetic complications: no     Last Vitals:  Vitals:   03/03/17 0754 03/03/17 0757  BP: (!) 133/58 119/61  Pulse: (!) 48 (!) 48  Resp: 16 12  Temp:    SpO2: 97% 98%    Last Pain: There were no vitals filed for this visit.               [address] [place],[name]

## 2017-03-03 NOTE — Anesthesia Post-op Follow-up Note (Signed)
Anesthesia QCDR form completed.        

## 2017-03-03 NOTE — Anesthesia Preprocedure Evaluation (Signed)
Anesthesia Evaluation  Patient identified by MRN, date of birth, ID band Patient awake    Reviewed: Allergy & Precautions, NPO status , Patient's Chart, lab work & pertinent test results  Airway Mallampati: II       Dental  (+) Upper Dentures, Lower Dentures   Pulmonary neg pulmonary ROS, former smoker,    breath sounds clear to auscultation       Cardiovascular Exercise Tolerance: Good hypertension, Pt. on medications + Peripheral Vascular Disease  + dysrhythmias Atrial Fibrillation  Rhythm:Regular Rate:Normal     Neuro/Psych Anxiety Depression    GI/Hepatic Neg liver ROS, PUD, GERD  Medicated,  Endo/Other  negative endocrine ROS  Renal/GU negative Renal ROS     Musculoskeletal   Abdominal Normal abdominal exam  (+)   Peds negative pediatric ROS (+)  Hematology negative hematology ROS (+)   Anesthesia Other Findings   Reproductive/Obstetrics                             Anesthesia Physical Anesthesia Plan  ASA: III  Anesthesia Plan: General   Post-op Pain Management:    Induction:   PONV Risk Score and Plan: 0  Airway Management Planned: Natural Airway and Nasal Cannula  Additional Equipment:   Intra-op Plan:   Post-operative Plan:   Informed Consent: I have reviewed the patients History and Physical, chart, labs and discussed the procedure including the risks, benefits and alternatives for the proposed anesthesia with the patient or authorized representative who has indicated his/her understanding and acceptance.     Plan Discussed with: CRNA  Anesthesia Plan Comments:         Anesthesia Quick Evaluation

## 2017-03-03 NOTE — CV Procedure (Signed)
Cardioversion note: A standard informed consent was obtained. Timeout was performed. The pads were placed in the anterior posterior fashion. The patient was given propofol by the anesthesia team.  Successful cardioversion was performed with a 200 J. The patient converted to sinus rhythm. Pre-and post EKGs were reviewed. The patient tolerated the procedure with no immediate complications.  Recommendations: Decrease Amiodarone to 200 mg once daily and follow-up in 2-3 weeks.

## 2017-03-03 NOTE — Discharge Instructions (Signed)
Electrical Cardioversion, Care After °This sheet gives you information about how to care for yourself after your procedure. Your health care provider may also give you more specific instructions. If you have problems or questions, contact your health care provider. °What can I expect after the procedure? °After the procedure, it is common to have: °· Some redness on the skin where the shocks were given. ° °Follow these instructions at home: °· Do not drive for 24 hours if you were given a medicine to help you relax (sedative). °· Take over-the-counter and prescription medicines only as told by your health care provider. °· Ask your health care provider how to check your pulse. Check it often. °· Rest for 48 hours after the procedure or as told by your health care provider. °· Avoid or limit your caffeine use as told by your health care provider. °Contact a health care provider if: °· You feel like your heart is beating too quickly or your pulse is not regular. °· You have a serious muscle cramp that does not go away. °Get help right away if: °· You have discomfort in your chest. °· You are dizzy or you feel faint. °· You have trouble breathing or you are short of breath. °· Your speech is slurred. °· You have trouble moving an arm or leg on one side of your body. °· Your fingers or toes turn cold or blue. °This information is not intended to replace advice given to you by your health care provider. Make sure you discuss any questions you have with your health care provider. °Document Released: 01/13/2013 Document Revised: 10/27/2015 Document Reviewed: 09/29/2015 °Elsevier Interactive Patient Education © 2018 Elsevier Inc. °General Anesthesia, Adult, Care After °These instructions provide you with information about caring for yourself after your procedure. Your health care provider may also give you more specific instructions. Your treatment has been planned according to current medical practices, but problems  sometimes occur. Call your health care provider if you have any problems or questions after your procedure. °What can I expect after the procedure? °After the procedure, it is common to have: °· Vomiting. °· A sore throat. °· Mental slowness. ° °It is common to feel: °· Nauseous. °· Cold or shivery. °· Sleepy. °· Tired. °· Sore or achy, even in parts of your body where you did not have surgery. ° °Follow these instructions at home: °For at least 24 hours after the procedure: °· Do not: °? Participate in activities where you could fall or become injured. °? Drive. °? Use heavy machinery. °? Drink alcohol. °? Take sleeping pills or medicines that cause drowsiness. °? Make important decisions or sign legal documents. °? Take care of children on your own. °· Rest. °Eating and drinking °· If you vomit, drink water, juice, or soup when you can drink without vomiting. °· Drink enough fluid to keep your urine clear or pale yellow. °· Make sure you have little or no nausea before eating solid foods. °· Follow the diet recommended by your health care provider. °General instructions °· Have a responsible adult stay with you until you are awake and alert. °· Return to your normal activities as told by your health care provider. Ask your health care provider what activities are safe for you. °· Take over-the-counter and prescription medicines only as told by your health care provider. °· If you smoke, do not smoke without supervision. °· Keep all follow-up visits as told by your health care provider. This is important. °Contact a   health care provider if: °· You continue to have nausea or vomiting at home, and medicines are not helpful. °· You cannot drink fluids or start eating again. °· You cannot urinate after 8-12 hours. °· You develop a skin rash. °· You have fever. °· You have increasing redness at the site of your procedure. °Get help right away if: °· You have difficulty breathing. °· You have chest pain. °· You have  unexpected bleeding. °· You feel that you are having a life-threatening or urgent problem. °This information is not intended to replace advice given to you by your health care provider. Make sure you discuss any questions you have with your health care provider. °Document Released: 07/01/2000 Document Revised: 08/28/2015 Document Reviewed: 03/09/2015 °Elsevier Interactive Patient Education © 2018 Elsevier Inc. ° °

## 2017-03-04 ENCOUNTER — Ambulatory Visit: Payer: Self-pay | Admitting: *Deleted

## 2017-03-04 ENCOUNTER — Encounter: Payer: Self-pay | Admitting: Cardiovascular Disease

## 2017-03-04 ENCOUNTER — Ambulatory Visit (INDEPENDENT_AMBULATORY_CARE_PROVIDER_SITE_OTHER)
Admission: RE | Admit: 2017-03-04 | Discharge: 2017-03-04 | Disposition: A | Discharge status: Home / Self Care | Payer: Medicare Other | Source: Ambulatory Visit | Attending: Family Medicine | Admitting: Family Medicine

## 2017-03-04 ENCOUNTER — Ambulatory Visit: Payer: Medicare Other | Admitting: Family Medicine

## 2017-03-04 VITALS — BP 120/78 | HR 55 | Temp 97.8°F | Wt 140.0 lb

## 2017-03-04 DIAGNOSIS — M5136 Other intervertebral disc degeneration, lumbar region: Secondary | ICD-10-CM | POA: Diagnosis not present

## 2017-03-04 DIAGNOSIS — W19XXXA Unspecified fall, initial encounter: Secondary | ICD-10-CM | POA: Diagnosis not present

## 2017-03-04 DIAGNOSIS — M545 Low back pain: Secondary | ICD-10-CM

## 2017-03-04 DIAGNOSIS — M810 Age-related osteoporosis without current pathological fracture: Secondary | ICD-10-CM | POA: Diagnosis not present

## 2017-03-04 DIAGNOSIS — F331 Major depressive disorder, recurrent, moderate: Secondary | ICD-10-CM

## 2017-03-04 DIAGNOSIS — S3993XA Unspecified injury of pelvis, initial encounter: Secondary | ICD-10-CM | POA: Diagnosis not present

## 2017-03-04 DIAGNOSIS — R102 Pelvic and perineal pain: Secondary | ICD-10-CM | POA: Diagnosis not present

## 2017-03-04 DIAGNOSIS — S3992XA Unspecified injury of lower back, initial encounter: Secondary | ICD-10-CM | POA: Diagnosis not present

## 2017-03-04 NOTE — Assessment & Plan Note (Signed)
Recent fall at home with residual worsening lower back pain - check lumbar films r/o compression fracture. See below. Encouraged continued cane use. She already has life alert.

## 2017-03-04 NOTE — Patient Instructions (Addendum)
Xray today. We will be in touch with result.  Continue heating pad, tramadol at night time. We may consider starting either fosamax (weekly pill) or prolia (injection in office every 6 months).

## 2017-03-04 NOTE — Telephone Encounter (Signed)
Pt  Reports   She  [address]   About    1   Week   Ago    Pt  Is  Able  To  Walk  With  A  Cane    Worse  This    Am

## 2017-03-04 NOTE — Telephone Encounter (Signed)
Appt  Made  Today  With  Dr  Danise Mina  Today  2  Pm   Reason for Disposition . [1] High-risk adult (e.g., age > 28, osteoporosis, chronic steroid use) AND [2] still hurts  Answer Assessment - Initial Assessment Questions 1. MECHANISM: "How did the injury happen?" (Consider the possibility of domestic violence or elder abuse)     Fell  Backward   1   Week   Ago   inj  Back    2. ONSET: "When did the injury happen?" (Minutes or hours ago)      1  Week    3. LOCATION: "What part of the back is injured?"       LOWER  BACK  4. SEVERITY: "Can you move the back normally?"        PAIN  WHEN  SHE  MOVES    5. PAIN: "Is there any pain?" If so, ask: "How bad is the pain?"   (Scale 1-10; or mild, moderate, severe)     MODERATE  7   6. CORD SYMPTOMS: Any weakness or numbness of the arms or legs?"       NO 7. SIZE: For cuts, bruises, or swelling, ask: "How large is it?" (e.g., inches or centimeters)      NO 8. TETANUS: For any breaks in the skin, ask: "When was the last tetanus booster?"     NO 9. OTHER SYMPTOMS: "Do you have any other symptoms?" (e.g., abdominal pain, blood in urine)      nO 10. PREGNANCY: "Is there any chance you are pregnant?" "When was your last menstrual period?"       Menapause  Protocols used: BACK INJURY-A-AH

## 2017-03-04 NOTE — Assessment & Plan Note (Signed)
She feels cymbalta has been beneficial - continues 20mg  bid.

## 2017-03-04 NOTE — Progress Notes (Signed)
BP 120/78 (BP Location: Left Arm, Patient Position: Sitting, Cuff Size: Normal)   Pulse (!) 55   Temp 97.8 F (36.6 C) (Oral)   Wt 140 lb (63.5 kg)   SpO2 95%   BMI 24.80 kg/m    CC: back pain after fall Subjective:    Patient ID: [name], female    DOB: Apr 24, 1929, 83 y.o.   MRN: [id number]  HPI: [name] is a 83 y.o. female presenting on 03/04/2017 for Back Pain (has chronic back pain. But reports she fell 02/23/17 at home on back. Pain is center lower back.)   DOI: 02/23/2017 Suffered fall in kitchen while letting dog into the house. Denies dizziness or syncope/presyncope. Landed on back, persistent lower back pain. Worse pain with laying supine. Denies numbness/weakness or shooting pain down legs. She was at home alone - daughter came home right away to check on her. She does have life alert but didn't need to use.   Known h/o osteoporosis with prior compression fracture, latest DEXA 07/2016 showing T -2.7 spine, -2.6 hip.   Known chronic lumbar back pain with radiation down bilateral buttock/groin - spinal stenosis, disc herniation, lumbar radiculitis s/p ESI with limited relief. Current regimen is tramadol 100mg  nightly, last visit we started cymbalta and titrated to 40mg  daily.   Cardioversion yesterday [facility] Anon).  Relevant past medical, surgical, family and social history reviewed and updated as indicated. Interim medical history since our last visit reviewed. Allergies and medications reviewed and updated. Outpatient Medications Prior to Visit  Medication Sig Dispense Refill  . acetaminophen (TYLENOL) 500 MG tablet Take 1,000 mg every 8 (eight) hours as needed by mouth for moderate pain.     Marland Kitchen acyclovir (ZOVIRAX) 400 MG tablet Take 400 mg 2 (two) times daily by mouth.     Marland Kitchen amiodarone (PACERONE) 200 MG tablet Decrease to 200 mg once daily. 30 tablet 3  . atorvastatin (LIPITOR) 20 MG tablet TAKE ONE TABLET EVERY DAY 90 tablet 1  . calcium-vitamin D (OSCAL WITH  D) 500-200 MG-UNIT per tablet Take 1 tablet by mouth daily.      . Cholecalciferol (VITAMIN D3) 1000 units CAPS Take 1 capsule (1,000 Units total) by mouth daily. 30 capsule   . DULoxetine (CYMBALTA) 20 MG capsule Take cymbalta daily for 2 week then increase to 40mg  daily (Patient taking differently: Take 20 mg daily by mouth. ) 60 capsule 3  . ELIQUIS 5 MG TABS tablet TAKE ONE TABLET BY MOUTH TWICE DAILY 60 tablet 6  . ferrous sulfate 325 (65 FE) MG tablet Take 1 tablet (325 mg total) by mouth daily with breakfast. (Patient taking differently: Take 325 mg 2 (two) times a week by mouth. )    . gabapentin (NEURONTIN) 100 MG capsule Take 2 capsules (200 mg total) by mouth 2 (two) times daily. (Patient taking differently: Take 100 mg by mouth 2 (two) times daily as needed (back pain). ) 120 capsule 3  . metoprolol tartrate (LOPRESSOR) 50 MG tablet Take 1 tablet (50 mg total) 2 (two) times daily by mouth. 180 tablet 3  . pantoprazole (PROTONIX) 40 MG tablet TAKE ONE TABLET EVERY DAY 30 tablet 6  . prednisoLONE acetate (PRED FORTE) 1 % ophthalmic suspension Place 1 drop into the left eye daily.     Marland Kitchen rOPINIRole (REQUIP) 1 MG tablet TAKE TWO TABLETS AT BEDTIME 60 tablet 6  . tiZANidine (ZANAFLEX) 4 MG capsule Take 4 mg daily as needed by mouth (leg cramps).     Marland Kitchen  vitamin B-12 (CYANOCOBALAMIN) 1000 MCG tablet Take 1,000 mcg 3 (three) times a week by mouth.      No facility-administered medications prior to visit.      Per HPI unless specifically indicated in ROS section below Review of Systems     Objective:    BP 120/78 (BP Location: Left Arm, Patient Position: Sitting, Cuff Size: Normal)   Pulse (!) 55   Temp 97.8 F (36.6 C) (Oral)   Wt 140 lb (63.5 kg)   SpO2 95%   BMI 24.80 kg/m   Wt Readings from Last 3 Encounters:  03/04/17 140 lb (63.5 kg)  03/03/17 142 lb (64.4 kg)  02/17/17 140 lb (63.5 kg)    Physical Exam  Constitutional: She is oriented to person, place, and time. She  appears well-developed and well-nourished. No distress.  Musculoskeletal: She exhibits no edema.  No significant pain midline spine Lower lumbar paraspinous mm tenderness bilaterally No pain with int/ext rotation at hip. Discomfort at SIJ bilaterally No pain at sciatic notch bilaterally.   Neurological: She is alert and oriented to person, place, and time.  Limited ROM due to pain with transitions and ambulation Walks with cane  Skin: Skin is warm and dry. No rash noted.  Nursing note and vitals reviewed.  Results for orders placed or performed during the hospital encounter of 02/25/17  INR/PT  Result Value Ref Range   Prothrombin Time 16.4 (H) 11.4 - 15.2 seconds   INR 6.01   Basic Metabolic Panel (BMET)  Result Value Ref Range   Sodium 136 135 - 145 mmol/L   Potassium 3.5 3.5 - 5.1 mmol/L   Chloride 98 (L) 101 - 111 mmol/L   CO2 27 22 - 32 mmol/L   Glucose, Bld 119 (H) 65 - 99 mg/dL   BUN 14 6 - 20 mg/dL   Creatinine, Ser 0.93 0.44 - 1.00 mg/dL   Calcium 9.7 8.9 - 10.3 mg/dL   GFR calc non Af Amer 54 (L) >60 mL/min   GFR calc Af Amer >60 >60 mL/min   Anion gap 11 5 - 15  CBC  Result Value Ref Range   WBC 8.2 3.6 - 11.0 K/uL   RBC 4.44 3.80 - 5.20 MIL/uL   Hemoglobin 13.9 12.0 - 16.0 g/dL   HCT 41.7 35.0 - 47.0 %   MCV 94.0 80.0 - 100.0 fL   MCH 31.3 26.0 - 34.0 pg   MCHC 33.3 32.0 - 36.0 g/dL   RDW 14.2 11.5 - 14.5 %   Platelets 235 150 - 440 K/uL      Assessment & Plan:   Problem List Items Addressed This Visit    DDD (degenerative disc disease), lumbar   Relevant Orders   DG Lumbar Spine Complete   Fall - Primary    Recent fall at home with residual worsening lower back pain - check lumbar films r/o compression fracture. See below. Encouraged continued cane use. She already has life alert.       Relevant Orders   DG Lumbar Spine Complete   DG Pelvis 1-2 Views   MDD (major depressive disorder), recurrent episode ([place])    She feels cymbalta has been  beneficial - continues 20mg  bid.       Osteoporosis    Update lumbar films to r/o vertebral compression fracture after fall. Reviewed recent DEXA scan. Recommended bisphosphonate vs prolia. Will price out prolia.           Follow up plan: Return if symptoms  worsen or fail to improve.  Ria Bush, MD

## 2017-03-04 NOTE — Assessment & Plan Note (Signed)
Update lumbar films to r/o vertebral compression fracture after fall. Reviewed recent DEXA scan. Recommended bisphosphonate vs prolia. Will price out prolia.

## 2017-03-06 ENCOUNTER — Other Ambulatory Visit: Payer: Self-pay | Admitting: Family Medicine

## 2017-03-06 ENCOUNTER — Telehealth: Payer: Self-pay | Admitting: Family Medicine

## 2017-03-06 DIAGNOSIS — M8000XA Age-related osteoporosis with current pathological fracture, unspecified site, initial encounter for fracture: Secondary | ICD-10-CM

## 2017-03-06 MED ORDER — DULOXETINE HCL 20 MG PO CPEP: 20.0000 mg | ORAL_CAPSULE | Freq: Two times a day (BID) | ORAL | 3 refills | Status: DC

## 2017-03-06 MED ORDER — ALENDRONATE SODIUM 70 MG PO TABS: 70.0000 mg | ORAL_TABLET | ORAL | 3 refills | Status: DC

## 2017-03-06 NOTE — Telephone Encounter (Signed)
Copied from [place] [id number]. Topic: Quick Communication - See Telephone Encounter >> Mar 06, 2017 11:00 AM Ether Griffins B wrote: CRM for notification. See Telephone encounter for:  Pt would like reports of xrays from the other day. Pt mentioned doc had said something about starting a medication or getting a shot. Pt is in a lot of pain.  03/06/17.

## 2017-03-06 NOTE — Telephone Encounter (Signed)
Spoke with patient re findings. Likely new 25% compression fracture L1. rec start bisphosphonate while we price out prolia.  Offered stronger pain medication than tylenol - pt declines. Offered lumbar back brace - pt declines, doesn't think she would be able to get it on. I don't remember if I sent this to [facility] so will forward to see if we can price out prolia for patient.

## 2017-03-11 ENCOUNTER — Telehealth: Payer: Self-pay | Admitting: *Deleted

## 2017-03-11 NOTE — Telephone Encounter (Signed)
Information has been submitted to pts insurance for verification of benefits. Awaiting response for coverage  

## 2017-03-18 ENCOUNTER — Ambulatory Visit: Payer: Medicare Other | Admitting: Cardiovascular Disease

## 2017-03-19 ENCOUNTER — Other Ambulatory Visit: Payer: Self-pay | Admitting: Family Medicine

## 2017-03-25 ENCOUNTER — Telehealth: Payer: Self-pay

## 2017-03-25 ENCOUNTER — Ambulatory Visit: Payer: Medicare Other | Admitting: Nurse Practitioner

## 2017-03-25 ENCOUNTER — Encounter: Payer: Self-pay | Admitting: Nurse Practitioner

## 2017-03-25 VITALS — BP 124/78 | HR 113 | Ht 63.0 in | Wt 136.8 lb

## 2017-03-25 DIAGNOSIS — I10 Essential (primary) hypertension: Secondary | ICD-10-CM | POA: Diagnosis not present

## 2017-03-25 DIAGNOSIS — Z01812 Encounter for preprocedural laboratory examination: Secondary | ICD-10-CM | POA: Diagnosis not present

## 2017-03-25 DIAGNOSIS — I482 Chronic atrial fibrillation: Secondary | ICD-10-CM | POA: Diagnosis not present

## 2017-03-25 DIAGNOSIS — I480 Paroxysmal atrial fibrillation: Secondary | ICD-10-CM

## 2017-03-25 MED ORDER — AMIODARONE HCL 200 MG PO TABS: ORAL_TABLET | ORAL | 3 refills | Status: DC

## 2017-03-25 NOTE — H&P (View-Only) (Signed)
Office Visit    Patient Name: [name] Date of Encounter: 03/25/2017  Primary Care Provider:  Ria Bush, MD Primary Cardiologist:  [name], MD  Chief Complaint    83 year old female with a history of paroxysmal and more recently persistent atrial fibrillation, hypertension, hyperlipidemia, and chronic back pain, who presents for follow-up related to recurrent atrial fibrillation.  Past Medical History    Past Medical History:  Diagnosis Date  . Abdominal aortic atherosclerosis ([place]) 09/2015   by xray  . Anxiety   . BCC (basal cell carcinoma of skin) 05/2011   on nose  . Breast lesion    a. diagnosed with complex sclerosing lesion with calcifications of the right breast in 08/2016  . Breast mass, right   . Chest pain    a. nuclear stress test 03/2012: normal  . DDD (degenerative disc disease), lumbosacral 12/06/1992  . Dysrhythmia    a-fib  . Gallstones 09/2015   incidentally by xray  . GERD (gastroesophageal reflux disease) 05/1999  . History of shingles    ophthalmic, takes acyclovir daily  . Hyperlipidemia 12/2003  . Hypertension 1990  . Mitral regurgitation    a. echo 03/2012: EF 60-65%, no RWMA, mild to mod AI/MR, mod TR, PASP 37 mmHg  . Osteoarthritis 08/21/1989  . Osteoporosis with fracture 11/1997   with compression fracture T12  . Persistent atrial fibrillation ([place]) 03/16/2012   a. CHADS2VASc => 5 (HTN, age x 2, vascular disease, sex category)-->Eliquis 5 BID;  b. Recurrent AF in 06/2016;  c. 01/2017 s/p DCCV;  d. 02/2017 recurrent AFib->Amio added->DCCV; e. 03/2017 recurrent AFib.  . Rheumatoid arthritis ([place])   . RLS (restless legs syndrome)    Past Surgical History:  Procedure Laterality Date  . ABDOMINAL HYSTERECTOMY  Age 64 - 62   S/P TAH  . abdominal ultrasound  04/23/2004   Gallstones  . BREAST LUMPECTOMY WITH RADIOACTIVE SEED LOCALIZATION Right 10/2016   breast lumpectomy with radioactive seed localization and margin  assessment for complex sclerosing lesion [facility])  . BREAST LUMPECTOMY WITH RADIOACTIVE SEED LOCALIZATION Right 11/05/2016   Procedure: RIGHT BREAST LUMPECTOMY WITH RADIOACTIVE SEED LOCALIZATION;  Surgeon: Fanny Skates, MD;  Location: [place];  Service: General;  Laterality: Right;  . CARDIOVERSION N/A 01/31/2017   Procedure: CARDIOVERSION;  Surgeon: Wellington Hampshire, MD;  Location: [facility];  Service: Cardiovascular;  Laterality: N/A;  . CARDIOVERSION N/A 03/03/2017   Procedure: CARDIOVERSION;  Surgeon: Wellington Hampshire, MD;  Location: [facility];  Service: Cardiovascular;  Laterality: N/A;  . CATARACT EXTRACTION  05/2010   left eye  . CERVICAL DISCECTOMY  1992   Fusion (Dr. Joya Salm)  . ESI Bilateral 01/2016   S1 transforaminal ESI x3  . ESOPHAGOGASTRODUODENOSCOPY  01/01/2002   with ulcer, bx. neg; + stricture gastric ulcer with hemorrhage  . ESOPHAGOGASTRODUODENOSCOPY  04/23/2004   Esophageal stricture -- dilated  . nuclear stress test  03/2012   no ischemia  . SKIN CANCER EXCISION  05/20/2011   BCC from nose  . US ECHOCARDIOGRAPHY  03/2012   in flutter, EF 60%, mild-mod aortic, mitral, tricuspid regurg, mildly dilated LA    Allergies  Allergies  Allergen Reactions  . Penicillins Rash    Childhood Allergy Has patient had a PCN reaction causing immediate rash, facial/tongue/throat swelling, SOB or lightheadedness with hypotension: Yes Has patient had a PCN reaction causing severe rash involving mucus membranes or skin necrosis: Unknown Has patient had a PCN reaction that required hospitalization:  No Has patient had a PCN reaction occurring within the last 10 years: No If all of the above answers are "NO", then may proceed with Cephalosporin use.     History of Present Illness    83 year old female with the above complex past medical history including hypertension, hyperlipidemia, paroxysmal A. fib, and osteoporosis.  In March of this year, she was noted to  be in atrial fibrillation.  She has been anticoagulated with Eliquis and initial attempts were made at rate control however, she continued to have dyspnea on exertion.  In October, she underwent cardioversion which was successful but patient believes it only lasted a day or 2.  When she was seen in early November, she was back in atrial fibrillation she was placed on amiodarone.  She underwent repeat cardioversion November 26, which was again unsuccessful.  Patient says about 3 days after cardioversion, she noted dyspnea on exertion and also irregular heart rhythm.  Today, she is back in atrial fibrillation at a rate of 113.  She did take her medicine this morning.  As noted, she has some degree of chronic dyspnea on exertion though activity is fairly limited by low back pain.  She denies chest pain, PND, orthopnea, dizziness, syncope, edema, or early satiety.  She is currently taking amiodarone 200 mg daily.  Home Medications    Prior to Admission medications   Medication Sig Start Date End Date Taking? Authorizing Provider  acetaminophen (TYLENOL) 500 MG tablet Take 1,000 mg every 8 (eight) hours as needed by mouth for moderate pain.    Yes [provider]  acyclovir (ZOVIRAX) 400 MG tablet Take 400 mg 2 (two) times daily by mouth.    Yes [provider]  alendronate (FOSAMAX) 70 MG tablet Take 1 tablet (70 mg total) by mouth every 7 (seven) days. Take with a full glass of water on an empty stomach. 03/06/17  Yes Ria Bush, MD  amiodarone (PACERONE) 200 MG tablet 1 tablet twice a day 03/25/17  Yes Rogelia Mire, NP  atorvastatin (LIPITOR) 20 MG tablet TAKE ONE TABLET EVERY DAY 03/06/17  Yes Ria Bush, MD  calcium-vitamin D (OSCAL WITH D) 500-200 MG-UNIT per tablet Take 1 tablet by mouth daily.     Yes [provider]  Cholecalciferol (VITAMIN D3) 1000 units CAPS Take 1 capsule (1,000 Units total) by mouth daily. 06/01/16  Yes Ria Bush, MD    DULoxetine (CYMBALTA) 20 MG capsule Take 1 capsule (20 mg total) by mouth 2 (two) times daily. 03/06/17  Yes Ria Bush, MD  ELIQUIS 5 MG TABS tablet TAKE ONE TABLET BY MOUTH TWICE DAILY 01/01/17  Yes Wellington Hampshire, MD  ferrous sulfate 325 (65 FE) MG tablet Take 1 tablet (325 mg total) by mouth daily with breakfast. Patient taking differently: Take 325 mg 2 (two) times a week by mouth.  09/03/12  Yes Ria Bush, MD  gabapentin (NEURONTIN) 100 MG capsule Take 2 capsules (200 mg total) by mouth 2 (two) times daily. Patient taking differently: Take 100 mg by mouth 2 (two) times daily as needed (back pain).  11/11/16  Yes Ria Bush, MD  metoprolol tartrate (LOPRESSOR) 50 MG tablet Take 1 tablet (50 mg total) 2 (two) times daily by mouth. 02/17/17 05/18/17 Yes Wellington Hampshire, MD  pantoprazole (PROTONIX) 40 MG tablet TAKE ONE TABLET EVERY DAY 07/29/16  Yes Ria Bush, MD  prednisoLONE acetate (PRED FORTE) 1 % ophthalmic suspension Place 1 drop into the left eye daily.  Yes [provider]  rOPINIRole (REQUIP) 1 MG tablet TAKE TWO TABLETS BY MOUTH AT BEDTIME 03/19/17  Yes Ria Bush, MD  tiZANidine (ZANAFLEX) 4 MG capsule Take 4 mg daily as needed by mouth (leg cramps).    Yes [provider]  traMADol (ULTRAM) 50 MG tablet Take 50 mg by mouth 2 (two) times daily.  03/15/17  Yes [provider]  vitamin B-12 (CYANOCOBALAMIN) 1000 MCG tablet Take 1,000 mcg 3 (three) times a week by mouth.    Yes [provider]    Review of Systems    Palpitations and dyspnea on exertion as outlined above.  She has chronic low back pain.  She denies chest pain, PND, orthopnea, dizziness, syncope, edema, or early satiety.  All other systems reviewed and are otherwise negative except as noted above.  Physical Exam    VS:  BP 124/78 (BP Location: Left Arm, Patient Position: Sitting, Cuff Size: Normal)   Pulse (!) 113   Ht 5\' 3"  (1.6 m)   Wt 136  lb 12 oz (62 kg)   BMI 24.22 kg/m  , BMI Body mass index is 24.22 kg/m. GEN: Well nourished, well developed, in no acute distress.  HEENT: normal.  Neck: Supple, no JVD, carotid bruits, or masses. Cardiac: Irregularly irregular, no murmurs, rubs, or gallops. No clubbing, cyanosis, edema.  Radials/DP/PT 2+ and equal bilaterally.  Respiratory:  Respirations regular and unlabored, clear to auscultation bilaterally. GI: Soft, nontender, nondistended, BS + x 4. MS: no deformity or atrophy. Skin: warm and dry, no rash. Neuro:  Strength and sensation are intact. Psych: Normal affect.  Accessory Clinical Findings    ECG -coarse atrial fibrillation, 113, delayed R progression, no acute changes.  Assessment & Plan    1.  Persistent atrial fibrillation: Patient was noted to be in atrial fibrillation in March and remained in atrial fibrillation until October, when she underwent cardioversion.  This was initially successful but then she reverted to A. fib a few days later.  In November, she was placed on amiodarone and following adequate load, she underwent repeat cardioversion which again, was initially successful.  Unfortunately, she says a few days after cardioversion she felt irregularity and dyspnea on exertion again.  She is in fact in A. fib again at a rate of 113.  We discussed options for management at length.  She would be willing to give cardioversion one more try and I have asked her to increase her amiodarone to 400 mg twice daily for the next week.  Following that, she will drop down to 200 twice daily if cardioversion late next week is successful, and we would likely plan to leave her on 200 mg twice daily.  If however, repeat cardioversion is not successful, we would have to reconsider options for rate control as blood pressure will allow, and potentially alternate antiarrhythmic therapy after adequate amiodarone washout.  Patient and daughter are in agreement with that plan.  She remains on  Eliquis and has not missed any doses.  Follow-up lab work today including liver function testing and TSH now that she is on amiodarone.  TSH not been evaluated since August 2016.  2.  Essential hypertension: Stable.  3.  Disposition: Patient will be scheduled for cardioversion late next week.  Follow-up in the office with in a few weeks after cardioversion.  Murray Hodgkins, NP 03/25/2017, 12:57 PM

## 2017-03-25 NOTE — Progress Notes (Signed)
Office Visit    Patient Name: [name] Date of Encounter: 03/25/2017  Primary Care Provider:  Ria Bush, MD Primary Cardiologist:  [name], MD  Chief Complaint    83 year old female with a history of paroxysmal and more recently persistent atrial fibrillation, hypertension, hyperlipidemia, and chronic back pain, who presents for follow-up related to recurrent atrial fibrillation.  Past Medical History    Past Medical History:  Diagnosis Date  . Abdominal aortic atherosclerosis ([place]) 09/2015   by xray  . Anxiety   . BCC (basal cell carcinoma of skin) 05/2011   on nose  . Breast lesion    a. diagnosed with complex sclerosing lesion with calcifications of the right breast in 08/2016  . Breast mass, right   . Chest pain    a. nuclear stress test 03/2012: normal  . DDD (degenerative disc disease), lumbosacral 12/06/1992  . Dysrhythmia    a-fib  . Gallstones 09/2015   incidentally by xray  . GERD (gastroesophageal reflux disease) 05/1999  . History of shingles    ophthalmic, takes acyclovir daily  . Hyperlipidemia 12/2003  . Hypertension 1990  . Mitral regurgitation    a. echo 03/2012: EF 60-65%, no RWMA, mild to mod AI/MR, mod TR, PASP 37 mmHg  . Osteoarthritis 08/21/1989  . Osteoporosis with fracture 11/1997   with compression fracture T12  . Persistent atrial fibrillation ([place]) 03/16/2012   a. CHADS2VASc => 5 (HTN, age x 2, vascular disease, sex category)-->Eliquis 5 BID;  b. Recurrent AF in 06/2016;  c. 01/2017 s/p DCCV;  d. 02/2017 recurrent AFib->Amio added->DCCV; e. 03/2017 recurrent AFib.  . Rheumatoid arthritis ([place])   . RLS (restless legs syndrome)    Past Surgical History:  Procedure Laterality Date  . ABDOMINAL HYSTERECTOMY  Age 53 - 77   S/P TAH  . abdominal ultrasound  04/23/2004   Gallstones  . BREAST LUMPECTOMY WITH RADIOACTIVE SEED LOCALIZATION Right 10/2016   breast lumpectomy with radioactive seed localization and margin  assessment for complex sclerosing lesion [facility])  . BREAST LUMPECTOMY WITH RADIOACTIVE SEED LOCALIZATION Right 11/05/2016   Procedure: RIGHT BREAST LUMPECTOMY WITH RADIOACTIVE SEED LOCALIZATION;  Surgeon: Fanny Skates, MD;  Location: [place];  Service: General;  Laterality: Right;  . CARDIOVERSION N/A 01/31/2017   Procedure: CARDIOVERSION;  Surgeon: Wellington Hampshire, MD;  Location: [facility];  Service: Cardiovascular;  Laterality: N/A;  . CARDIOVERSION N/A 03/03/2017   Procedure: CARDIOVERSION;  Surgeon: Wellington Hampshire, MD;  Location: [facility];  Service: Cardiovascular;  Laterality: N/A;  . CATARACT EXTRACTION  05/2010   left eye  . CERVICAL DISCECTOMY  1992   Fusion (Dr. Joya Salm)  . ESI Bilateral 01/2016   S1 transforaminal ESI x3  . ESOPHAGOGASTRODUODENOSCOPY  01/01/2002   with ulcer, bx. neg; + stricture gastric ulcer with hemorrhage  . ESOPHAGOGASTRODUODENOSCOPY  04/23/2004   Esophageal stricture -- dilated  . nuclear stress test  03/2012   no ischemia  . SKIN CANCER EXCISION  05/20/2011   BCC from nose  . US ECHOCARDIOGRAPHY  03/2012   in flutter, EF 60%, mild-mod aortic, mitral, tricuspid regurg, mildly dilated LA    Allergies  Allergies  Allergen Reactions  . Penicillins Rash    Childhood Allergy Has patient had a PCN reaction causing immediate rash, facial/tongue/throat swelling, SOB or lightheadedness with hypotension: Yes Has patient had a PCN reaction causing severe rash involving mucus membranes or skin necrosis: Unknown Has patient had a PCN reaction that required hospitalization:  No Has patient had a PCN reaction occurring within the last 10 years: No If all of the above answers are "NO", then may proceed with Cephalosporin use.     History of Present Illness    83 year old female with the above complex past medical history including hypertension, hyperlipidemia, paroxysmal A. fib, and osteoporosis.  In March of this year, she was noted to  be in atrial fibrillation.  She has been anticoagulated with Eliquis and initial attempts were made at rate control however, she continued to have dyspnea on exertion.  In October, she underwent cardioversion which was successful but patient believes it only lasted a day or 2.  When she was seen in early November, she was back in atrial fibrillation she was placed on amiodarone.  She underwent repeat cardioversion November 26, which was again unsuccessful.  Patient says about 3 days after cardioversion, she noted dyspnea on exertion and also irregular heart rhythm.  Today, she is back in atrial fibrillation at a rate of 113.  She did take her medicine this morning.  As noted, she has some degree of chronic dyspnea on exertion though activity is fairly limited by low back pain.  She denies chest pain, PND, orthopnea, dizziness, syncope, edema, or early satiety.  She is currently taking amiodarone 200 mg daily.  Home Medications    Prior to Admission medications   Medication Sig Start Date End Date Taking? Authorizing Provider  acetaminophen (TYLENOL) 500 MG tablet Take 1,000 mg every 8 (eight) hours as needed by mouth for moderate pain.    Yes [provider]  acyclovir (ZOVIRAX) 400 MG tablet Take 400 mg 2 (two) times daily by mouth.    Yes [provider]  alendronate (FOSAMAX) 70 MG tablet Take 1 tablet (70 mg total) by mouth every 7 (seven) days. Take with a full glass of water on an empty stomach. 03/06/17  Yes Ria Bush, MD  amiodarone (PACERONE) 200 MG tablet 1 tablet twice a day 03/25/17  Yes Rogelia Mire, NP  atorvastatin (LIPITOR) 20 MG tablet TAKE ONE TABLET EVERY DAY 03/06/17  Yes Ria Bush, MD  calcium-vitamin D (OSCAL WITH D) 500-200 MG-UNIT per tablet Take 1 tablet by mouth daily.     Yes [provider]  Cholecalciferol (VITAMIN D3) 1000 units CAPS Take 1 capsule (1,000 Units total) by mouth daily. 06/01/16  Yes Ria Bush, MD    DULoxetine (CYMBALTA) 20 MG capsule Take 1 capsule (20 mg total) by mouth 2 (two) times daily. 03/06/17  Yes Ria Bush, MD  ELIQUIS 5 MG TABS tablet TAKE ONE TABLET BY MOUTH TWICE DAILY 01/01/17  Yes Wellington Hampshire, MD  ferrous sulfate 325 (65 FE) MG tablet Take 1 tablet (325 mg total) by mouth daily with breakfast. Patient taking differently: Take 325 mg 2 (two) times a week by mouth.  09/03/12  Yes Ria Bush, MD  gabapentin (NEURONTIN) 100 MG capsule Take 2 capsules (200 mg total) by mouth 2 (two) times daily. Patient taking differently: Take 100 mg by mouth 2 (two) times daily as needed (back pain).  11/11/16  Yes Ria Bush, MD  metoprolol tartrate (LOPRESSOR) 50 MG tablet Take 1 tablet (50 mg total) 2 (two) times daily by mouth. 02/17/17 05/18/17 Yes Wellington Hampshire, MD  pantoprazole (PROTONIX) 40 MG tablet TAKE ONE TABLET EVERY DAY 07/29/16  Yes Ria Bush, MD  prednisoLONE acetate (PRED FORTE) 1 % ophthalmic suspension Place 1 drop into the left eye daily.  Yes [provider]  rOPINIRole (REQUIP) 1 MG tablet TAKE TWO TABLETS BY MOUTH AT BEDTIME 03/19/17  Yes Ria Bush, MD  tiZANidine (ZANAFLEX) 4 MG capsule Take 4 mg daily as needed by mouth (leg cramps).    Yes [provider]  traMADol (ULTRAM) 50 MG tablet Take 50 mg by mouth 2 (two) times daily.  03/15/17  Yes [provider]  vitamin B-12 (CYANOCOBALAMIN) 1000 MCG tablet Take 1,000 mcg 3 (three) times a week by mouth.    Yes [provider]    Review of Systems    Palpitations and dyspnea on exertion as outlined above.  She has chronic low back pain.  She denies chest pain, PND, orthopnea, dizziness, syncope, edema, or early satiety.  All other systems reviewed and are otherwise negative except as noted above.  Physical Exam    VS:  BP 124/78 (BP Location: Left Arm, Patient Position: Sitting, Cuff Size: Normal)   Pulse (!) 113   Ht 5\' 3"  (1.6 m)   Wt 136  lb 12 oz (62 kg)   BMI 24.22 kg/m  , BMI Body mass index is 24.22 kg/m. GEN: Well nourished, well developed, in no acute distress.  HEENT: normal.  Neck: Supple, no JVD, carotid bruits, or masses. Cardiac: Irregularly irregular, no murmurs, rubs, or gallops. No clubbing, cyanosis, edema.  Radials/DP/PT 2+ and equal bilaterally.  Respiratory:  Respirations regular and unlabored, clear to auscultation bilaterally. GI: Soft, nontender, nondistended, BS + x 4. MS: no deformity or atrophy. Skin: warm and dry, no rash. Neuro:  Strength and sensation are intact. Psych: Normal affect.  Accessory Clinical Findings    ECG -coarse atrial fibrillation, 113, delayed R progression, no acute changes.  Assessment & Plan    1.  Persistent atrial fibrillation: Patient was noted to be in atrial fibrillation in March and remained in atrial fibrillation until October, when she underwent cardioversion.  This was initially successful but then she reverted to A. fib a few days later.  In November, she was placed on amiodarone and following adequate load, she underwent repeat cardioversion which again, was initially successful.  Unfortunately, she says a few days after cardioversion she felt irregularity and dyspnea on exertion again.  She is in fact in A. fib again at a rate of 113.  We discussed options for management at length.  She would be willing to give cardioversion one more try and I have asked her to increase her amiodarone to 400 mg twice daily for the next week.  Following that, she will drop down to 200 twice daily if cardioversion late next week is successful, and we would likely plan to leave her on 200 mg twice daily.  If however, repeat cardioversion is not successful, we would have to reconsider options for rate control as blood pressure will allow, and potentially alternate antiarrhythmic therapy after adequate amiodarone washout.  Patient and daughter are in agreement with that plan.  She remains on  Eliquis and has not missed any doses.  Follow-up lab work today including liver function testing and TSH now that she is on amiodarone.  TSH not been evaluated since August 2016.  2.  Essential hypertension: Stable.  3.  Disposition: Patient will be scheduled for cardioversion late next week.  Follow-up in the office with in a few weeks after cardioversion.  Murray Hodgkins, NP 03/25/2017, 12:57 PM

## 2017-03-25 NOTE — Telephone Encounter (Signed)
Dr. Fletcher Anon would like to move 12/27 DCCV  to 12/26. Left message for [place] in scheduling.  Left message for pt to contact the office.

## 2017-03-25 NOTE — Telephone Encounter (Signed)
Confirmed w/scheduling and pt that DCCV date has been changed to 12/26. Pt agreeable.

## 2017-03-25 NOTE — Patient Instructions (Addendum)
Medication Instructions:  Your physician has recommended you make the following change in your medication:  New amiodarone dosage:  -Beginning today, take 400mg  twice a day for one week then -Decrease to 200mg  twice a day   Labwork: CMET, CBC, TSH, Magnesium today  Testing/Procedures: Your physician has recommended that you have a Cardioversion (DCCV). Electrical Cardioversion uses a jolt of electricity to your heart either through paddles or wired patches attached to your chest. This is a controlled, usually prescheduled, procedure. Defibrillation is done under light anesthesia in the hospital, and you usually go home the day of the procedure. This is done to get your heart back into a normal rhythm. You are not awake for the procedure. Please see the instruction sheet given to you today.  You are scheduled for a Cardioversion on Thursday, December 27 with Dr. Fletcher Payne Please arrive at the [facility] at  6:30a.m. on the day of your procedure.  DIET INSTRUCTIONS:  Nothing to eat or drink after midnight except your medications with a              sip of water.          1) Medications:  YOU MAY TAKE ALL of your remaining medications with a small amount of water.  2) Must have a responsible person to drive you home.  3) Bring a current list of your medications and current insurance cards.    If you have any questions after you get home, please call the office at [phone]   Follow-Up: Your physician recommends that you schedule a follow-up appointment with Dr. Fletcher Payne one week after 12/27 cardioversion   Any Other Special Instructions Will Be Listed Below (If Applicable).     If you need a refill on your cardiac medications before your next appointment, please call your pharmacy.   Electrical Cardioversion Electrical cardioversion is the delivery of a jolt of electricity to restore a normal rhythm to the heart. A rhythm that is too fast or is not regular keeps the  heart from pumping well. In this procedure, sticky patches or metal paddles are placed on the chest to deliver electricity to the heart from a device. This procedure may be done in an emergency if:  There is low or no blood pressure as a result of the heart rhythm.  Normal rhythm must be restored as fast as possible to protect the brain and heart from further damage.  It may save a life.  This procedure may also be done for irregular or fast heart rhythms that are not immediately life-threatening. Tell a health care provider about:  Any allergies you have.  All medicines you are taking, including vitamins, herbs, eye drops, creams, and over-the-counter medicines.  Any problems you or family members have had with anesthetic medicines.  Any blood disorders you have.  Any surgeries you have had.  Any medical conditions you have.  Whether you are pregnant or may be pregnant. What are the risks? Generally, this is a safe procedure. However, problems may occur, including:  Allergic reactions to medicines.  A blood clot that breaks free and travels to other parts of your body.  The possible return of an abnormal heart rhythm within hours or days after the procedure.  Your heart stopping (cardiac arrest). This is rare.  What happens before the procedure? Medicines  Your health care provider may have you start taking: ? Blood-thinning medicines (anticoagulants) so your blood does not clot as easily. ? Medicines  may be given to help stabilize your heart rate and rhythm.  Ask your health care provider about changing or stopping your regular medicines. This is especially important if you are taking diabetes medicines or blood thinners. General instructions  Plan to have someone take you home from the hospital or clinic.  If you will be going home right after the procedure, plan to have someone with you for 24 hours.  Follow instructions from your health care provider about  eating or drinking restrictions. What happens during the procedure?  To lower your risk of infection: ? Your health care team will wash or sanitize their hands. ? Your skin will be washed with soap.  An IV tube will be inserted into one of your veins.  You will be given a medicine to help you relax (sedative).  Sticky patches (electrodes) or metal paddles may be placed on your chest.  An electrical shock will be delivered. The procedure may vary among health care providers and hospitals. What happens after the procedure?  Your blood pressure, heart rate, breathing rate, and blood oxygen level will be monitored until the medicines you were given have worn off.  Do not drive for 24 hours if you were given a sedative.  Your heart rhythm will be watched to make sure it does not change. This information is not intended to replace advice given to you by your health care provider. Make sure you discuss any questions you have with your health care provider. Document Released: 03/15/2002 Document Revised: 11/22/2015 Document Reviewed: 09/29/2015 Elsevier Interactive Patient Education  2017 [employer].

## 2017-03-26 LAB — COMPREHENSIVE METABOLIC PANEL
A/G RATIO: 2.1 (ref 1.2–2.2)
ALK PHOS: 91 IU/L (ref 39–117)
ALT: 15 IU/L (ref 0–32)
AST: 17 IU/L (ref 0–40)
Albumin: 4.2 g/dL (ref 3.5–4.7)
BILIRUBIN TOTAL: 0.5 mg/dL (ref 0.0–1.2)
BUN/Creatinine Ratio: 15 (ref 12–28)
BUN: 11 mg/dL (ref 8–27)
CALCIUM: 9.1 mg/dL (ref 8.7–10.3)
CHLORIDE: 103 mmol/L (ref 96–106)
CO2: 22 mmol/L (ref 20–29)
Creatinine, Ser: 0.72 mg/dL (ref 0.57–1.00)
GFR calc Af Amer: 88 mL/min/{1.73_m2} (ref >59)
GFR, EST NON AFRICAN AMERICAN: 77 mL/min/{1.73_m2} (ref >59)
GLOBULIN, TOTAL: 2 g/dL (ref 1.5–4.5)
Glucose: 90 mg/dL (ref 65–99)
POTASSIUM: 3.9 mmol/L (ref 3.5–5.2)
SODIUM: 142 mmol/L (ref 134–144)
Total Protein: 6.2 g/dL (ref 6.0–8.5)

## 2017-03-26 LAB — CBC
HEMOGLOBIN: 13.3 g/dL (ref 11.1–15.9)
Hematocrit: 38.4 % (ref 34.0–46.6)
MCH: 31.2 pg (ref 26.6–33.0)
MCHC: 34.6 g/dL (ref 31.5–35.7)
MCV: 90 fL (ref 79–97)
PLATELETS: 268 10*3/uL (ref 150–379)
RBC: 4.26 x10E6/uL (ref 3.77–5.28)
RDW: 14.6 % (ref 12.3–15.4)
WBC: 5.1 10*3/uL (ref 3.4–10.8)

## 2017-03-26 LAB — MAGNESIUM: MAGNESIUM: 2 mg/dL (ref 1.6–2.3)

## 2017-03-26 LAB — TSH: TSH: 4.2 u[IU]/mL (ref 0.450–4.500)

## 2017-04-02 ENCOUNTER — Encounter: Payer: Self-pay | Admitting: Anesthesiology

## 2017-04-02 ENCOUNTER — Ambulatory Visit: Payer: Medicare Other | Admitting: Certified Registered"

## 2017-04-02 ENCOUNTER — Ambulatory Visit
Admission: RE | Admit: 2017-04-02 | Discharge: 2017-04-02 | Disposition: A | Discharge status: Home / Self Care | Payer: Medicare Other | Source: Ambulatory Visit | Attending: Cardiovascular Disease | Admitting: Cardiovascular Disease

## 2017-04-02 ENCOUNTER — Encounter
Admission: RE | Disposition: A | Discharge status: Home / Self Care | Payer: Self-pay | Source: Ambulatory Visit | Attending: Cardiovascular Disease

## 2017-04-02 DIAGNOSIS — I480 Paroxysmal atrial fibrillation: Secondary | ICD-10-CM | POA: Diagnosis not present

## 2017-04-02 DIAGNOSIS — I739 Peripheral vascular disease, unspecified: Secondary | ICD-10-CM | POA: Diagnosis not present

## 2017-04-02 DIAGNOSIS — Z7901 Long term (current) use of anticoagulants: Secondary | ICD-10-CM | POA: Diagnosis not present

## 2017-04-02 DIAGNOSIS — M810 Age-related osteoporosis without current pathological fracture: Secondary | ICD-10-CM | POA: Diagnosis not present

## 2017-04-02 DIAGNOSIS — Z880 Allergy status to penicillin: Secondary | ICD-10-CM | POA: Insufficient documentation

## 2017-04-02 DIAGNOSIS — G8929 Other chronic pain: Secondary | ICD-10-CM | POA: Diagnosis not present

## 2017-04-02 DIAGNOSIS — I340 Nonrheumatic mitral (valve) insufficiency: Secondary | ICD-10-CM | POA: Insufficient documentation

## 2017-04-02 DIAGNOSIS — I10 Essential (primary) hypertension: Secondary | ICD-10-CM | POA: Insufficient documentation

## 2017-04-02 DIAGNOSIS — I700 Atherosclerosis of aorta: Secondary | ICD-10-CM | POA: Insufficient documentation

## 2017-04-02 DIAGNOSIS — E785 Hyperlipidemia, unspecified: Secondary | ICD-10-CM | POA: Insufficient documentation

## 2017-04-02 DIAGNOSIS — F329 Major depressive disorder, single episode, unspecified: Secondary | ICD-10-CM | POA: Insufficient documentation

## 2017-04-02 DIAGNOSIS — F419 Anxiety disorder, unspecified: Secondary | ICD-10-CM | POA: Diagnosis not present

## 2017-04-02 DIAGNOSIS — Z85828 Personal history of other malignant neoplasm of skin: Secondary | ICD-10-CM | POA: Diagnosis not present

## 2017-04-02 DIAGNOSIS — B0233 Zoster keratitis: Secondary | ICD-10-CM | POA: Diagnosis not present

## 2017-04-02 DIAGNOSIS — G2581 Restless legs syndrome: Secondary | ICD-10-CM | POA: Diagnosis not present

## 2017-04-02 DIAGNOSIS — I481 Persistent atrial fibrillation: Secondary | ICD-10-CM | POA: Insufficient documentation

## 2017-04-02 DIAGNOSIS — M549 Dorsalgia, unspecified: Secondary | ICD-10-CM | POA: Diagnosis not present

## 2017-04-02 DIAGNOSIS — Z90710 Acquired absence of both cervix and uterus: Secondary | ICD-10-CM | POA: Insufficient documentation

## 2017-04-02 DIAGNOSIS — K219 Gastro-esophageal reflux disease without esophagitis: Secondary | ICD-10-CM | POA: Diagnosis not present

## 2017-04-02 DIAGNOSIS — Z8619 Personal history of other infectious and parasitic diseases: Secondary | ICD-10-CM | POA: Diagnosis not present

## 2017-04-02 DIAGNOSIS — I4891 Unspecified atrial fibrillation: Secondary | ICD-10-CM | POA: Diagnosis not present

## 2017-04-02 DIAGNOSIS — M1990 Unspecified osteoarthritis, unspecified site: Secondary | ICD-10-CM | POA: Diagnosis not present

## 2017-04-02 DIAGNOSIS — M069 Rheumatoid arthritis, unspecified: Secondary | ICD-10-CM | POA: Insufficient documentation

## 2017-04-02 DIAGNOSIS — Z79899 Other long term (current) drug therapy: Secondary | ICD-10-CM | POA: Insufficient documentation

## 2017-04-02 HISTORY — PX: CARDIOVERSION: EP1203

## 2017-04-02 SURGERY — CARDIOVERSION (CATH LAB)
Anesthesia: General

## 2017-04-02 MED ORDER — ADENOSINE 6 MG/2ML IV SOLN
INTRAVENOUS | Status: AC
  Administered 2017-04-02: 6 mg
  Filled 2017-04-02: qty 2

## 2017-04-02 MED ORDER — SODIUM CHLORIDE 0.9 % IV SOLN
INTRAVENOUS | Status: DC
  Administered 2017-04-02: 07:00:00 via INTRAVENOUS

## 2017-04-02 MED ORDER — METOPROLOL TARTRATE 50 MG PO TABS: 25.0000 mg | ORAL_TABLET | Freq: Two times a day (BID) | ORAL | 3 refills | Status: DC

## 2017-04-02 MED ORDER — PROPOFOL 10 MG/ML IV BOLUS
INTRAVENOUS | Status: DC | PRN
  Administered 2017-04-02: 60 mg via INTRAVENOUS

## 2017-04-02 MED ORDER — AMIODARONE HCL 200 MG PO TABS: ORAL_TABLET | ORAL | 3 refills | Status: DC

## 2017-04-02 MED ORDER — PROPOFOL 10 MG/ML IV BOLUS
INTRAVENOUS | Status: AC
  Filled 2017-04-02: qty 20

## 2017-04-02 NOTE — Interval H&P Note (Signed)
History and Physical Interval Note:  04/02/2017 8:28 AM  [name]  has presented today for surgery, with the diagnosis of Cardioversion   Start time 7:30a    Afib  The various methods of treatment have been discussed with the patient and family. After consideration of risks, benefits and other options for treatment, the patient has consented to  Procedure(s): CARDIOVERSION (N/A) as a surgical intervention .  The patient's history has been reviewed, patient examined, no change in status, stable for surgery.  I have reviewed the patient's chart and labs.  Questions were answered to the patient's satisfaction.     [name]

## 2017-04-02 NOTE — CV Procedure (Signed)
Cardioversion note: A standard informed consent was obtained. Timeout was performed.  The patient was noted to be in a regular rhythm with a heart rate of 98 bpm.  I could not differentiate whether this was sinus or atypical flutter/tachycardia.  Thus, I decided to give 6 mg of IV adenosine push which slowed down the heart enough to be able to see flutter waves.  Thus, I proceeded with cardioversion. The pads were placed in the anterior posterior fashion. The patient was given propofol by the anesthesia team.  Successful cardioversion was performed with a 200 J. The patient converted to sinus rhythm. Pre-and post EKGs were reviewed. The patient tolerated the procedure with no immediate complications.  Recommendations: Decrease metoprolol to 25 mg twice daily.  Continue amiodarone 200 mg twice daily and anticoagulation with Eliquis. Follow-up in 2 weeks.

## 2017-04-02 NOTE — Anesthesia Post-op Follow-up Note (Signed)
Anesthesia QCDR form completed.        

## 2017-04-02 NOTE — Anesthesia Postprocedure Evaluation (Signed)
Anesthesia Post Note  Patient: MILICENT ACHEAMPONG  Procedure(s) Performed: CARDIOVERSION (N/A )  Patient location during evaluation: Other Anesthesia Type: General Level of consciousness: awake and alert and oriented Pain management: pain level controlled Vital Signs Assessment: post-procedure vital signs reviewed and stable Respiratory status: spontaneous breathing Cardiovascular status: blood pressure returned to baseline Anesthetic complications: no     Last Vitals:  Vitals:   04/02/17 0847 04/02/17 0900  BP: (!) 176/78 (!) 156/69  Pulse: (!) 54 (!) 55  Resp: 13 16  Temp:    SpO2: 92% 95%    Last Pain:  Vitals:   04/02/17 0704  TempSrc: Oral                 Laderius Valbuena

## 2017-04-02 NOTE — Discharge Instructions (Signed)
Electrical Cardioversion, Care After °This sheet gives you information about how to care for yourself after your procedure. Your health care provider may also give you more specific instructions. If you have problems or questions, contact your health care provider. °What can I expect after the procedure? °After the procedure, it is common to have: °· Some redness on the skin where the shocks were given. ° °Follow these instructions at home: °· Do not drive for 24 hours if you were given a medicine to help you relax (sedative). °· Take over-the-counter and prescription medicines only as told by your health care provider. °· Ask your health care provider how to check your pulse. Check it often. °· Rest for 48 hours after the procedure or as told by your health care provider. °· Avoid or limit your caffeine use as told by your health care provider. °Contact a health care provider if: °· You feel like your heart is beating too quickly or your pulse is not regular. °· You have a serious muscle cramp that does not go away. °Get help right away if: °· You have discomfort in your chest. °· You are dizzy or you feel faint. °· You have trouble breathing or you are short of breath. °· Your speech is slurred. °· You have trouble moving an arm or leg on one side of your body. °· Your fingers or toes turn cold or blue. °This information is not intended to replace advice given to you by your health care provider. Make sure you discuss any questions you have with your health care provider. °Document Released: 01/13/2013 Document Revised: 10/27/2015 Document Reviewed: 09/29/2015 °Elsevier Interactive Patient Education © 2018 Else[employer] Inc[employer] ° °

## 2017-04-02 NOTE — Anesthesia Preprocedure Evaluation (Signed)
Anesthesia Evaluation  Patient identified by MRN, date of birth, ID band Patient awake    Reviewed: Allergy & Precautions, H&P , NPO status , Patient's Chart, lab work & pertinent test results, reviewed documented beta blocker date and time   Airway Mallampati: III  TM Distance: >3 FB Neck ROM: Full    Dental no notable dental hx. (+) Upper Dentures, Lower Dentures, Dental Advisory Given   Pulmonary neg pulmonary ROS, former smoker,    Pulmonary exam normal breath sounds clear to auscultation       Cardiovascular hypertension, Pt. on medications and Pt. on home beta blockers + Peripheral Vascular Disease  + dysrhythmias Atrial Fibrillation + Valvular Problems/Murmurs MR  Rhythm:Irregular Rate:Normal     Neuro/Psych PSYCHIATRIC DISORDERS Anxiety Depression negative neurological ROS     GI/Hepatic Neg liver ROS, PUD, GERD  Medicated and Controlled,  Endo/Other  negative endocrine ROS  Renal/GU negative Renal ROS  negative genitourinary   Musculoskeletal  (+) Arthritis , Osteoarthritis,    Abdominal   Peds negative pediatric ROS (+)  Hematology negative hematology ROS (+)   Anesthesia Other Findings   Reproductive/Obstetrics negative OB ROS                             Anesthesia Physical  Anesthesia Plan  ASA: III  Anesthesia Plan: General   Post-op Pain Management:    Induction: Intravenous  PONV Risk Score and Plan: 4 or greater and Ondansetron, Dexamethasone and Treatment may vary due to age or medical condition  Airway Management Planned: Nasal Cannula  Additional Equipment:   Intra-op Plan:   Post-operative Plan:   Informed Consent: I have reviewed the patients History and Physical, chart, labs and discussed the procedure including the risks, benefits and alternatives for the proposed anesthesia with the patient or authorized representative who has indicated his/her  understanding and acceptance.   Dental advisory given  Plan Discussed with: CRNA and Surgeon  Anesthesia Plan Comments:         Anesthesia Quick Evaluation

## 2017-04-02 NOTE — Transfer of Care (Signed)
Immediate Anesthesia Transfer of Care Note  [name]  Procedure(s) Performed: CARDIOVERSION (N/A )  Patient Location: spu  Anesthesia Type:General  Level of Consciousness: awake  Airway & Oxygen Therapy: Patient Spontanous Breathing and Patient connected to nasal cannula oxygen  Post-op Assessment: Report given to RN and Post -op Vital signs reviewed and stable  Post vital signs: Reviewed  Last Vitals:  Vitals:   04/02/17 0758 04/02/17 0759  BP:  (!) 174/61  Pulse: (!) 47 (!) 48  Resp: 18 20  Temp:    SpO2: 96% 95%    Last Pain:  Vitals:   04/02/17 0704  TempSrc: Oral         Complications: No apparent anesthesia complications

## 2017-04-02 NOTE — Progress Notes (Signed)
Patient clinically stable post cardioversion per Dr Genelle Bal at bedside, sinus brady per monitor. Dr. Fletcher Anon out to speak with daughter and patient post procedure with questions answered. Vitals stable. Discharge instructions given with questions answered. Denies complaints at this time.

## 2017-04-03 ENCOUNTER — Encounter: Payer: Self-pay | Admitting: Cardiovascular Disease

## 2017-04-04 ENCOUNTER — Ambulatory Visit: Payer: Self-pay

## 2017-04-04 ENCOUNTER — Other Ambulatory Visit: Payer: Self-pay | Admitting: Family Medicine

## 2017-04-04 NOTE — Telephone Encounter (Signed)
   Reason for Disposition . Difficulty swallowing is a chronic symptom (recurrent or ongoing AND present > 4 weeks)  Answer Assessment - Initial Assessment Questions 1. SYMPTOM: "Are you having difficulty swallowing liquids, solids, or both?"     Yes 2. ONSET: "When did the swallowing problems begin?"      Started yesterday 3. CAUSE: "What do you think is causing the problem?"      Has had her "Esophagus stretched before." 4. CHRONIC/RECURRENT: "Is this a new problem for you?"  If no, ask: "How long have you had this problem?" (e.g., days, weeks, months)      Recurrent 5. OTHER SYMPTOMS: "Do you have any other symptoms?" (e.g., difficulty breathing, sore throat, swollen tongue, chest pain)     No other problems, no shortness of breath 6. PREGNANCY: "Is there any chance you are pregnant?" "When was your last menstrual period?"     No  Protocols used: SWALLOWING DIFFICULTY-A-AH  Pt. Has an appointment for Monday. Will go to ED if symptoms worsen. States "I really think it's my esophagus again. I feel fine."

## 2017-04-07 ENCOUNTER — Ambulatory Visit: Payer: Medicare Other | Admitting: Family Medicine

## 2017-04-07 ENCOUNTER — Encounter: Payer: Self-pay | Admitting: Family Medicine

## 2017-04-07 ENCOUNTER — Encounter: Payer: Self-pay | Admitting: Gastroenterology

## 2017-04-07 VITALS — BP 122/76 | HR 53 | Temp 97.9°F | Wt 135.0 lb

## 2017-04-07 DIAGNOSIS — M150 Primary generalized (osteo)arthritis: Secondary | ICD-10-CM | POA: Diagnosis not present

## 2017-04-07 DIAGNOSIS — M545 Low back pain, unspecified: Secondary | ICD-10-CM

## 2017-04-07 DIAGNOSIS — M8000XD Age-related osteoporosis with current pathological fracture, unspecified site, subsequent encounter for fracture with routine healing: Secondary | ICD-10-CM | POA: Diagnosis not present

## 2017-04-07 DIAGNOSIS — I481 Persistent atrial fibrillation: Secondary | ICD-10-CM

## 2017-04-07 DIAGNOSIS — R1319 Other dysphagia: Secondary | ICD-10-CM

## 2017-04-07 DIAGNOSIS — K219 Gastro-esophageal reflux disease without esophagitis: Secondary | ICD-10-CM

## 2017-04-07 DIAGNOSIS — M159 Polyosteoarthritis, unspecified: Secondary | ICD-10-CM

## 2017-04-07 DIAGNOSIS — R1310 Dysphagia, unspecified: Secondary | ICD-10-CM | POA: Diagnosis not present

## 2017-04-07 DIAGNOSIS — I4819 Other persistent atrial fibrillation: Secondary | ICD-10-CM

## 2017-04-07 DIAGNOSIS — M51369 Other intervertebral disc degeneration, lumbar region without mention of lumbar back pain or lower extremity pain: Secondary | ICD-10-CM

## 2017-04-07 DIAGNOSIS — M5136 Other intervertebral disc degeneration, lumbar region: Secondary | ICD-10-CM | POA: Diagnosis not present

## 2017-04-07 NOTE — Patient Instructions (Addendum)
Start using lumbar support brace for compression fracture - prescription provided today.  We will check on prolia injections. We will refer you to GI for trouble swallowing - see Rosaria Ferries.  Continue protonix.  For possible sacroiliitis - treat with tylenol, tramadol, heating pad.

## 2017-04-07 NOTE — Progress Notes (Signed)
BP 122/76 (BP Location: Left Arm, Patient Position: Sitting, Cuff Size: Normal)   Pulse (!) 53   Temp 97.9 F (36.6 C) (Oral)   Wt 135 lb (61.2 kg)   SpO2 97%   BMI 23.91 kg/m    CC: 1 mo f/u visit Subjective:    Patient ID: [name], female    DOB: 30-Jul-1929, 83 y.o.   MRN: [id number]  HPI: [name] is a 83 y.o. female presenting on 04/07/2017 for Follow-up   Here with June her daughter. Hospitalization last week for PAF s/p 3rd cardioversion. Now on amiodarone. Dyspnea seems to be recurring  Fall last month, suffered new 25% compression fracture of L1 vertebra. We started fosamax and are in process of awaiting prolia coverage (started 02/2017).   Since last month she's had another injury while out in snow when trying to bring her dog back in. Hyperextended back, right lower back pain since then.   Some longstanding pill dysphagia but worsened during dinner 2 weeks ago - choked on piece of meat, had to cough piece back up. Hoarseness since then. Worsening GERD/indigestion. H/o esophageal stretching due to strictures in the past (2000s). Denies nausea/vomiting, early satiety or weight loss.   Relevant past medical, surgical, family and social history reviewed and updated as indicated. Interim medical history since our last visit reviewed. Allergies and medications reviewed and updated. Outpatient Medications Prior to Visit  Medication Sig Dispense Refill  . acetaminophen (TYLENOL) 500 MG tablet Take 1,000 mg every 8 (eight) hours as needed by mouth for moderate pain.     Marland Kitchen acyclovir (ZOVIRAX) 400 MG tablet Take 400 mg 2 (two) times daily by mouth.     Marland Kitchen alendronate (FOSAMAX) 70 MG tablet Take 1 tablet (70 mg total) by mouth every 7 (seven) days. Take with a full glass of water on an empty stomach. 4 tablet 3  . amiodarone (PACERONE) 200 MG tablet 1 tablet twice a day 60 tablet 3  . atorvastatin (LIPITOR) 20 MG tablet TAKE ONE TABLET EVERY DAY 90 tablet 1  .  calcium-vitamin D (OSCAL WITH D) 500-200 MG-UNIT per tablet Take 1 tablet by mouth daily.      . Cholecalciferol (VITAMIN D3) 1000 units CAPS Take 1 capsule (1,000 Units total) by mouth daily. 30 capsule   . DULoxetine (CYMBALTA) 20 MG capsule Take 1 capsule (20 mg total) by mouth 2 (two) times daily. 60 capsule 3  . ELIQUIS 5 MG TABS tablet TAKE ONE TABLET BY MOUTH TWICE DAILY 60 tablet 6  . ferrous sulfate 325 (65 FE) MG tablet Take 1 tablet (325 mg total) by mouth daily with breakfast.    . gabapentin (NEURONTIN) 100 MG capsule Take 2 capsules (200 mg total) by mouth 2 (two) times daily. 120 capsule 3  . metoprolol tartrate (LOPRESSOR) 25 MG tablet TAKE 1 TABLET TWICE DAILY 60 tablet 2  . metoprolol tartrate (LOPRESSOR) 50 MG tablet Take 0.5 tablets (25 mg total) by mouth 2 (two) times daily. 180 tablet 3  . pantoprazole (PROTONIX) 40 MG tablet TAKE ONE TABLET EVERY DAY 30 tablet 6  . prednisoLONE acetate (PRED FORTE) 1 % ophthalmic suspension Place 1 drop into the left eye daily.     Marland Kitchen rOPINIRole (REQUIP) 1 MG tablet TAKE TWO TABLETS BY MOUTH AT BEDTIME 60 tablet 3  . tiZANidine (ZANAFLEX) 4 MG capsule Take 4 mg daily as needed by mouth (leg cramps).     . traMADol (ULTRAM) 50 MG tablet Take  50 mg by mouth 2 (two) times daily.     . vitamin B-12 (CYANOCOBALAMIN) 1000 MCG tablet Take 1,000 mcg 3 (three) times a week by mouth.      No facility-administered medications prior to visit.      Per HPI unless specifically indicated in ROS section below Review of Systems     Objective:    BP 122/76 (BP Location: Left Arm, Patient Position: Sitting, Cuff Size: Normal)   Pulse (!) 53   Temp 97.9 F (36.6 C) (Oral)   Wt 135 lb (61.2 kg)   SpO2 97%   BMI 23.91 kg/m   Wt Readings from Last 3 Encounters:  04/07/17 135 lb (61.2 kg)  04/02/17 136 lb (61.7 kg)  03/25/17 136 lb 12 oz (62 kg)    Physical Exam  Constitutional: She is oriented to person, place, and time. She appears  well-developed and well-nourished. No distress.  HENT:  Mouth/Throat: Oropharynx is clear and moist. No oropharyngeal exudate.  Eyes: Conjunctivae are normal. Pupils are equal, round, and reactive to light.  Cardiovascular: Normal rate, regular rhythm, normal heart sounds and intact distal pulses.  No murmur heard. Pulmonary/Chest: Effort normal and breath sounds normal. No respiratory distress. She has no wheezes. She has no rales.  Musculoskeletal: She exhibits no edema.  No pain midline spine No paraspinous mm tenderness Neg SLR bilaterally. No pain at GTB or sciatic notch bilaterally.  Tender at R SIJ  Neurological: She is alert and oriented to person, place, and time.  Skin: Skin is warm and dry. No rash noted.  Nursing note and vitals reviewed.   LUMBAR SPINE - COMPLETE 4+ VIEW COMPARISON:  Lumbar spine MRI - 12/08/2015; lumbar spine radiographs - 09/11/2015 FINDINGS: There are 5 non rib-bearing lumbar type vertebral bodies. There is a mild scoliotic curvature of the thoracolumbar spine with dominant caudal component convex the left measuring approximately 14 degrees (as from the superior endplate of O97 to the inferior endplate of L4). No definite anterolisthesis or retrolisthesis. No definite pars defects. Mild (approximately 25%) compression deformity involving the superior endplate of D53 is similar to lumbar spine MRI performed 12/08/2015. Mild (approximately 25%) age-indeterminate compression deformity involving the superior endplate of L1. Remaining lumbar vertebral body heights appear preserved. Moderate to severe multilevel lumbar spine DDD, worse at L3-L4 and L5-S1 with disc space height loss, endplate irregularity and sclerosis. Limited visualization of the bilateral SI joints is normal. Laminated calcifications overlie the expected location of the gallbladder and may represent cholelithiasis. Dominant calcification measures approximately 1.8 cm in diameter.  Large colonic stool burden without evidence of enteric obstruction. Calcified atherosclerotic plaque within the abdominal aorta. IMPRESSION: 1. Age-indeterminate mild (approximately 25%) compression deformity involving the superior endplate of L1, new compared to lumbar spine MRI performed 12/08/2015. Correlation for point tenderness at this location is recommended. 2. Moderate to severe multilevel lumbar spine DDD. 3. Suspected cholelithiasis. 4. Aortic Atherosclerosis (ICD10-I70.0). Electronically Signed   By: Sandi Mariscal M.D.   On: 03/05/2017 08:04    Assessment & Plan:   Problem List Items Addressed This Visit    DDD (degenerative disc disease), lumbar   Esophageal dysphagia - Primary    New and worsening int he past week. Will stop fosamax, refer to GI for possible recurrent esophageal stricture.       Relevant Orders   Ambulatory referral to Gastroenterology   GERD    Continue protonix 40mg  daily. Refer to GI - see above.  Osteoarthritis   Osteoporosis with pathological fracture    Oral weekly fosamax started 02/2017, has taken for a month.  With worsening dysphagia, will stop fosamax and await prolia coverage determination - I checked with [place] who says we should know something in the new year.  Continue tylenol/tramadol for pain. Rx written for lumbar support back brace after L1 vertebral fracture. She will first try one she has at home.       Persistent atrial fibrillation (HCC)    S/p 3rd cardioversion, on amiodarone, metoprolol and eliquis. Sounds regular today. Has f/u planned with cards next month.       Right low back pain    She is tender today along R sacroiliac joint - ?sacroiliitis. Provided with home exercises from [facility] pt advisor.           Follow up plan: Return if symptoms worsen or fail to improve.  Ria Bush, MD

## 2017-04-08 DIAGNOSIS — M5416 Radiculopathy, lumbar region: Secondary | ICD-10-CM

## 2017-04-08 DIAGNOSIS — R1319 Other dysphagia: Secondary | ICD-10-CM | POA: Insufficient documentation

## 2017-04-08 DIAGNOSIS — G8929 Other chronic pain: Secondary | ICD-10-CM | POA: Insufficient documentation

## 2017-04-08 DIAGNOSIS — R1310 Dysphagia, unspecified: Secondary | ICD-10-CM | POA: Insufficient documentation

## 2017-04-08 NOTE — Assessment & Plan Note (Signed)
S/p 3rd cardioversion, on amiodarone, metoprolol and eliquis. Sounds regular today. Has f/u planned with cards next month.

## 2017-04-08 NOTE — Assessment & Plan Note (Signed)
Continue protonix 40mg  daily. Refer to GI - see above.

## 2017-04-08 NOTE — Assessment & Plan Note (Addendum)
Oral weekly fosamax started 02/2017, has taken for a month.  With worsening dysphagia, will stop fosamax and await prolia coverage determination - I checked with [place] who says we should know something in the new year.  Continue tylenol/tramadol for pain. Rx written for lumbar support back brace after L1 vertebral fracture. She will first try one she has at home.

## 2017-04-08 NOTE — Assessment & Plan Note (Signed)
She is tender today along R sacroiliac joint - ?sacroiliitis. Provided with home exercises from [facility] pt advisor.

## 2017-04-08 NOTE — Assessment & Plan Note (Signed)
New and worsening int he past week. Will stop fosamax, refer to GI for possible recurrent esophageal stricture.

## 2017-04-09 NOTE — Progress Notes (Signed)
Cardiology Office Note Date:  04/11/2017  Patient ID:  [name], [name] March 15, 1930, MRN [id number] PCP:  Ria Bush, MD  Cardiologist:  Dr. Fletcher Anon, MD    Chief Complaint: Follow up DCCV  History of Present Illness: [name] is a 84 y.o. female with history of persistent Afib on Eliquis, amiodarone and metoprolol s/p DCCV 01/2017, 02/2017, 03/2017 as detailed below, HTN, HLD, and chronic back pain who presents for follow up of recent DCCV.   In 06/2016, she was noted to be in atrial fibrillation. She was anticoagulated with Eliquis and initial attempts were made at rate control however, she continued to have dyspnea on exertion. On 01/31/2017, she underwent DCCV which was successful, but patient believed it only lasted 1-2 days. When she was seen in 02/2017, she was back in atrial fibrillation she was placed on amiodarone. She underwent repeat DCCV 03/03/2017, which was again only briefly successful, ~ 3 days after DCCV, she noted dyspnea on exertion and also irregular heart rhythm. She was seen in the office on 03/25/2017 and noted to be back in Afib with RVR with heart rates in the 110s bpm. She was re-loaded with amiodarone and underwent repeat DCCV, for a third time, on 04/02/2017 that was successful. However, she was noted to have a regular rhythm prior to her DCCV. There was concern for possible atypical atrial flutter, thus she was given 6 mg of IV adenosine which slowed the heart rate enough to see flutter waves. She then underwent successful DCCV. Her Lopressor was decreased to 25 mg bid following cardioversion and she was continued on amiodarone 200 mg bid as well as Eliquis 5 mg bid. Recent tsh, cmet, cbc, and magnesium from 03/25/17 were within goal.   She comes in doing well today from a cardiac standpoint. She feels like she has maintained sinus rhythm and EKG today confirms this. She does note chronic SOB, though this is improved some since her DCCV. She continues to note  chronic low back pain. She will be seeing GI in 05/2016 for evaluation of possible esophageal dilatation. No chest pain. Has not missed any doses of Eliquis. Heart rate at home has been in the mid 50s bpm. Prior, remote stress test in 2013 without significant ischemia.    Past Medical History:  Diagnosis Date  . Abdominal aortic atherosclerosis ([place]) 09/2015   by xray  . Anxiety   . BCC (basal cell carcinoma of skin) 05/2011   on nose  . Breast lesion    a. diagnosed with complex sclerosing lesion with calcifications of the right breast in 08/2016  . Chest pain    a. nuclear stress test 03/2012: normal  . DDD (degenerative disc disease), lumbosacral 12/06/1992  . Gallstones 09/2015   incidentally by xray  . GERD (gastroesophageal reflux disease) 05/1999  . History of shingles    ophthalmic, takes acyclovir daily  . Hyperlipidemia 12/2003  . Hypertension 1990  . Mitral regurgitation    a. echo 03/2012: EF 60-65%, no RWMA, mild to mod AI/MR, mod TR, PASP 37 mmHg  . Osteoarthritis 08/21/1989  . Osteoporosis with fracture 11/1997   with compression fracture T12  . Persistent atrial fibrillation ([place]) 03/16/2012   a. CHADS2VASc => 5 (HTN, age x 2, vascular disease, sex category)-->Eliquis 5 BID;  b. Recurrent AF in 06/2016;  c. 01/2017 s/p DCCV;  d. 02/2017 recurrent AFib->Amio added->DCCV; e. 03/2017 recurrent AFib, s/p reload of amio and DCCV 04/02/2017  . Rheumatoid arthritis ([place])   .  RLS (restless legs syndrome)     Past Surgical History:  Procedure Laterality Date  . ABDOMINAL HYSTERECTOMY  Age 87 - 7   S/P TAH  . abdominal ultrasound  04/23/2004   Gallstones  . BREAST LUMPECTOMY WITH RADIOACTIVE SEED LOCALIZATION Right 10/2016   breast lumpectomy with radioactive seed localization and margin assessment for complex sclerosing lesion [facility])  . BREAST LUMPECTOMY WITH RADIOACTIVE SEED LOCALIZATION Right 11/05/2016   Procedure: RIGHT BREAST LUMPECTOMY WITH RADIOACTIVE SEED  LOCALIZATION;  Surgeon: Fanny Skates, MD;  Location: [place];  Service: General;  Laterality: Right;  . CARDIOVERSION N/A 01/31/2017   Procedure: CARDIOVERSION;  Surgeon: Wellington Hampshire, MD;  Location: [facility];  Service: Cardiovascular;  Laterality: N/A;  . CARDIOVERSION N/A 03/03/2017   Procedure: CARDIOVERSION;  Surgeon: Wellington Hampshire, MD;  Location: [place] [facility];  Service: Cardiovascular;  Laterality: N/A;  . CARDIOVERSION N/A 04/02/2017   Procedure: CARDIOVERSION;  Surgeon: Wellington Hampshire, MD;  Location: [facility];  Service: Cardiovascular;  Laterality: N/A;  . CATARACT EXTRACTION  05/2010   left eye  . CERVICAL DISCECTOMY  1992   Fusion (Dr. Joya Salm)  . ESI Bilateral 01/2016   S1 transforaminal ESI x3  . ESOPHAGOGASTRODUODENOSCOPY  01/01/2002   with ulcer, bx. neg; + stricture gastric ulcer with hemorrhage  . ESOPHAGOGASTRODUODENOSCOPY  04/23/2004   Esophageal stricture -- dilated  . nuclear stress test  03/2012   no ischemia  . SKIN CANCER EXCISION  05/20/2011   BCC from nose  . US ECHOCARDIOGRAPHY  03/2012   in flutter, EF 60%, mild-mod aortic, mitral, tricuspid regurg, mildly dilated LA    Current Meds  Medication Sig  . acetaminophen (TYLENOL) 500 MG tablet Take 1,000 mg every 8 (eight) hours as needed by mouth for moderate pain.   Marland Kitchen acyclovir (ZOVIRAX) 400 MG tablet Take 400 mg 2 (two) times daily by mouth.   Marland Kitchen alendronate (FOSAMAX) 70 MG tablet Take 1 tablet (70 mg total) by mouth every 7 (seven) days. Take with a full glass of water on an empty stomach.  Marland Kitchen amiodarone (PACERONE) 200 MG tablet 1 tablet twice a day  . atorvastatin (LIPITOR) 20 MG tablet TAKE ONE TABLET EVERY DAY  . calcium-vitamin D (OSCAL WITH D) 500-200 MG-UNIT per tablet Take 1 tablet by mouth daily.    . Cholecalciferol (VITAMIN D3) 1000 units CAPS Take 1 capsule (1,000 Units total) by mouth daily.  . DULoxetine (CYMBALTA) 20 MG capsule Take 1 capsule (20 mg total) by mouth 2  (two) times daily.  Marland Kitchen ELIQUIS 5 MG TABS tablet TAKE ONE TABLET BY MOUTH TWICE DAILY  . ferrous sulfate 325 (65 FE) MG tablet Take 1 tablet (325 mg total) by mouth daily with breakfast.  . gabapentin (NEURONTIN) 100 MG capsule Take 2 capsules (200 mg total) by mouth 2 (two) times daily.  . metoprolol tartrate (LOPRESSOR) 25 MG tablet TAKE 1 TABLET TWICE DAILY  . pantoprazole (PROTONIX) 40 MG tablet TAKE ONE TABLET EVERY DAY  . prednisoLONE acetate (PRED FORTE) 1 % ophthalmic suspension Place 1 drop into the left eye daily.   Marland Kitchen rOPINIRole (REQUIP) 1 MG tablet TAKE TWO TABLETS BY MOUTH AT BEDTIME  . tiZANidine (ZANAFLEX) 4 MG capsule Take 4 mg daily as needed by mouth (leg cramps).   . traMADol (ULTRAM) 50 MG tablet Take 50 mg by mouth 2 (two) times daily.   . vitamin B-12 (CYANOCOBALAMIN) 1000 MCG tablet Take 1,000 mcg 3 (three) times a week by mouth.  Allergies:   Penicillins   Social History:  The patient  reports that she quit smoking about 41 years ago. She has a 0.25 pack-year smoking history. she has never used smokeless tobacco. She reports that she drinks about 1.2 oz of alcohol per week. She reports that she does not use drugs.   Family History:  The patient's family history includes Cancer in her brother; Diabetes in her brother, brother, brother, and sister; Emphysema in her mother; Heart disease in her brother and father; Hypertension in her brother; Stroke in her brother, father, and sister.  ROS:   Review of Systems  Constitutional: Positive for malaise/fatigue. Negative for chills, diaphoresis, fever and weight loss.  HENT: Negative for congestion.   Eyes: Negative for discharge and redness.  Respiratory: Positive for shortness of breath. Negative for cough, hemoptysis, sputum production and wheezing.   Cardiovascular: Negative for chest pain, palpitations, orthopnea, claudication, leg swelling and PND.  Gastrointestinal: Negative for abdominal pain, blood in stool,  heartburn, melena, nausea and vomiting.  Genitourinary: Negative for hematuria.  Musculoskeletal: Positive for back pain. Negative for falls and myalgias.  Skin: Negative for rash.  Neurological: Negative for dizziness, tingling, tremors, sensory change, speech change, focal weakness, loss of consciousness and weakness.  Endo/Heme/Allergies: Does not bruise/bleed easily.  Psychiatric/Behavioral: Negative for substance abuse. The patient is not nervous/anxious.   All other systems reviewed and are negative.    PHYSICAL EXAM:  VS:  BP 128/60 (BP Location: Right Arm, Patient Position: Sitting, Cuff Size: Normal)   Pulse (!) 55   Ht 5\' 2"  (1.575 m)   Wt 137 lb 8 oz (62.4 kg)   BMI 25.15 kg/m  BMI: Body mass index is 25.15 kg/m.  Physical Exam  Constitutional: She is oriented to person, place, and time. She appears well-developed and well-nourished.  HENT:  Head: Normocephalic and atraumatic.  Eyes: Right eye exhibits no discharge. Left eye exhibits no discharge.  Neck: Normal range of motion. No JVD present.  Cardiovascular: Regular rhythm, S1 normal, S2 normal and normal heart sounds. Bradycardia present. Exam reveals no distant heart sounds, no friction rub, no midsystolic click and no opening snap.  No murmur heard. Pulses:      Posterior tibial pulses are 2+ on the right side, and 2+ on the left side.  Pulmonary/Chest: Effort normal and breath sounds normal. No respiratory distress. She has no decreased breath sounds. She has no wheezes. She has no rales. She exhibits no tenderness.  Abdominal: Soft. She exhibits no distension. There is no tenderness.  Musculoskeletal: She exhibits no edema.  Neurological: She is alert and oriented to person, place, and time.  Skin: Skin is warm and dry. No cyanosis. Nails show no clubbing.  Psychiatric: She has a normal mood and affect. Her speech is normal and behavior is normal. Judgment and thought content normal.     EKG:  Was ordered and  interpreted by me today. Shows sinus bradycardia, 55 bpm, prolonged QTc 491, nonspecific st/t changes  Recent Labs: 03/25/2017: ALT 15; BUN 11; Creatinine, Ser 0.72; Hemoglobin 13.3; Magnesium 2.0; Platelets 268; Potassium 3.9; Sodium 142; TSH 4.200  05/28/2016: Cholesterol 195; HDL 53.70; LDL Cholesterol 120; Total CHOL/HDL Ratio 4; Triglycerides 107.0; VLDL 21.4   Estimated Creatinine Clearance: 44.6 mL/min (by C-G formula based on SCr of 0.72 mg/dL).   Wt Readings from Last 3 Encounters:  04/11/17 137 lb 8 oz (62.4 kg)  04/07/17 135 lb (61.2 kg)  04/02/17 136 lb (61.7 kg)  Other studies reviewed: Additional studies/records reviewed today include: summarized above  ASSESSMENT AND PLAN:  1. Persistent Afib/atypical flutter: Status post DCCV x 3 as above. Currently in sinus rhythm with a bradycardic heart rate. Decrease amiodarone to 200 mg daily. Continue Lopressor 25 mg bid. Continue Eliquis 5 mg bid given CHADS2VASc of at least 5 (HTN, age x 2, vascular disease, sex category). If she has recurrence of arrhythmia, consider EP evaluation.   2. HTN: Well controlled at this time. Continue current medications.   3. High-risk medication: Recent LFT and TSH 03/2017 unremarkable. QTc 491 msec. Decrease amiodarone to 200 mg daily. Plan for RN visit in 2 weeks to reassess QTc. Advised patient of yearly eye exams. Schedule pulmonary evaluation for baseline PFTs given her SOB.   4. SOB: Referral to pulmonology as above. If she is noted to have normal PFTs and SOB persists, consider Lexiscan Myoview to evaluate for high-risk ischemia. \  5. Dysphagia: TO see GI in 05/2016. Will need at least 4 weeks of uninterrupted anticoagulation, dating back to 04/02/2017, prior to any procedure.   6. Chronic low back pain: Sees MD at [facility]. Will need at least 4 weeks of uninterrupted anticoagulation, dating back to 04/02/2017, prior to coming off Eliquis if spinal injection is needed.   7. HLD: Lipitor.  Followed by PCP. Most recent LDL of 120 from 05/2016. Consider addition of Zetia.   8. Mild mitral regurgitation: Noted on echo 11/2016. Asymptomatic. No murmur noted on exam today. Follow clinically. Periodic echo.   Disposition: F/u with Dr. Fletcher Anon in 1 month.   Current medicines are reviewed at length with the patient today.  The patient did not have any concerns regarding medicines.  Melvern Banker PA-C 04/11/2017 2:55 PM     [address] [place], [place] [address] [phone]

## 2017-04-11 ENCOUNTER — Telehealth: Payer: Self-pay | Admitting: *Deleted

## 2017-04-11 ENCOUNTER — Ambulatory Visit: Payer: Medicare Other | Admitting: Physician Assistant

## 2017-04-11 ENCOUNTER — Encounter: Payer: Self-pay | Admitting: Physician Assistant

## 2017-04-11 VITALS — BP 128/60 | HR 55 | Ht 62.0 in | Wt 137.5 lb

## 2017-04-11 DIAGNOSIS — Z79899 Other long term (current) drug therapy: Secondary | ICD-10-CM

## 2017-04-11 DIAGNOSIS — I340 Nonrheumatic mitral (valve) insufficiency: Secondary | ICD-10-CM

## 2017-04-11 DIAGNOSIS — I10 Essential (primary) hypertension: Secondary | ICD-10-CM

## 2017-04-11 DIAGNOSIS — R0602 Shortness of breath: Secondary | ICD-10-CM

## 2017-04-11 DIAGNOSIS — I481 Persistent atrial fibrillation: Secondary | ICD-10-CM

## 2017-04-11 DIAGNOSIS — E785 Hyperlipidemia, unspecified: Secondary | ICD-10-CM | POA: Diagnosis not present

## 2017-04-11 DIAGNOSIS — I4819 Other persistent atrial fibrillation: Secondary | ICD-10-CM

## 2017-04-11 MED ORDER — AMIODARONE HCL 200 MG PO TABS: 200.0000 mg | ORAL_TABLET | Freq: Every day | ORAL | 3 refills | Status: DC

## 2017-04-11 NOTE — Patient Instructions (Addendum)
-   We have scheduled you to see the pulmonologist (lung doctor) given your shortness of breath. You will need a baseline PFT (lung function testing) given the amiodarone and especially with shortness of breath. If this study comes back normal we would plan to have you undergo a stress test to evaluate for high-risk blockage.  - Given the recent cardioversion, we would need to wait at least 4 weeks prior to interrupting the Eliquis for possible blockage work up if the stress test was abnormal.   - If you redevelop the rhythm you were in at the time of your 03/2017 cardioversion we can have you see one of our electrical heart doctors for possible ablation (cauterization of the electrical circuit).   - Don't hesitate to call if there are any questions.     Medication Instructions:  Your physician has recommended you make the following change in your medication:  1- DECREASE Amiodarone to 200 mg by mouth once a day.   Labwork: none  Testing/Procedures: none  Follow-Up: You have been referred to Pulmonology.  Your physician recommends that you schedule a follow-up appointment in: [place].    If you need a refill on your cardiac medications before your next appointment, please call your pharmacy.

## 2017-04-11 NOTE — Telephone Encounter (Signed)
After patient left the office from appointment, Crystal Faith, PA-C ordered for patient to come in for nurse visit in 2 weeks for EKG check to assess her QTC interval. No answer on either phone number listed. Left message to call back to schedule.

## 2017-04-15 NOTE — Telephone Encounter (Signed)
S/w patient. She verbalized understanding of EKG nurse visit. Scheduled patient to come in on 04/25/17 at 11 am for nurse visit. Patient verbalized understanding of appointment date and time.

## 2017-04-16 NOTE — Progress Notes (Signed)
[facility] Consultation      Assessment and Plan:  Dyspnea on exertion. -Patient has mild dyspnea on exertion, she was ambulated in the office today.  She had minimal dyspnea, she was mainly limited by debility/deconditioning with decreased leg strength and coordination. -Suspect that her dyspnea is predominantly multifactorial due to deconditioning, possible chronic bronchitis, cardiac disease. -We will send the patient for pulmonary function test to look for evidence of COPD, as well as reduced diffusion capacity as the patient is on amiodarone.  Chronic bronchitis. - This appears to be minimally symptomatic at this time, no treatment is recommended.  Esophageal dysphasia. -This may be contributing to some of the patient's dyspnea via aspiration pneumonitis. - Patient has an appointment to see gastroenterology for esophageal stretching which she has undergone in the past.  Orders Placed This Encounter  Procedures  . Pulmonary Function Test [facility] Only    Return in about 3 months (around 07/16/2017).      Date: 04/17/2017  MRN# [id number] Crystal PETRALIA 22-Apr-1929    [name] is a 84 y.o. old female seen in consultation for chief complaint of:    Chief Complaint  Patient presents with  . Advice Only    Referred by Christell Faith eval sob. would like to rule out ischemia. Pt states sob has gotten worse over the past year.   . Shortness of Breath    with exertion and wheezing. Pt denies cough.  . Chest Pain    only with exertion    HPI:   The patient is an 84 year old female, recently seen cardiology.  She was noted to have atrial fibrillation with persistent dyspnea on exertion, on chronic amiodarone therapy.  She was referred for dyspnea and for pulmonary function testing and respiratory evaluation. She is here with her daughter who gives some of the history.   She has been having issues with dyspnea, she was having afib and it was thought that  this was contributing to this. However her afib is now well controlled. She has severe back pain which is chronic. She has dyspnea which she thinks has been better over the past 2 days.  She is limited mostly by back pain.  She has some dyspnea with getting dressed. However she can make her bed and sweep the floor without too much trouble.  She used to be more active, she used to walk her dog for more than a mile, she last did this about 2 years ago, she also tended her yard and garden.  She smoked for about a year, last more than 30 years ago.   She has never been diagnosed with respiratory problems. She has a dog, sleeps in bed with her.   She denies reflux or sinus drainage.  She does not snore at night. She has trouble swallowing and feels like she is choking when she is eating.   Desat walk 04/17/17; on ra at rest sat is 98% HR 54; walked 180 feet at slow stumbling pace, sat was 94% and HR 62, conversational, mild dyspnea.   CBC 10/31/16; eosinophils equals 200. Chest x-ray 08/25/13; lungs slightly hyperinflated, scoliosis.   PMHX:   Past Medical History:  Diagnosis Date  . Abdominal aortic atherosclerosis ([place]) 09/2015   by xray  . Anxiety   . BCC (basal cell carcinoma of skin) 05/2011   on nose  . Breast lesion    a. diagnosed with complex sclerosing lesion with calcifications of the right breast in 08/2016  .  Chest pain    a. nuclear stress test 03/2012: normal  . DDD (degenerative disc disease), lumbosacral 12/06/1992  . Gallstones 09/2015   incidentally by xray  . GERD (gastroesophageal reflux disease) 05/1999  . History of shingles    ophthalmic, takes acyclovir daily  . Hyperlipidemia 12/2003  . Hypertension 1990  . Mitral regurgitation    a. echo 03/2012: EF 60-65%, no RWMA, mild to mod AI/MR, mod TR, PASP 37 mmHg  . Osteoarthritis 08/21/1989  . Osteoporosis with fracture 11/1997   with compression fracture T12  . Persistent atrial fibrillation ([place]) 03/16/2012   a.  CHADS2VASc => 5 (HTN, age x 2, vascular disease, sex category)-->Eliquis 5 BID;  b. Recurrent AF in 06/2016;  c. 01/2017 s/p DCCV;  d. 02/2017 recurrent AFib->Amio added->DCCV; e. 03/2017 recurrent AFib, s/p reload of amio and DCCV 04/02/2017  . Rheumatoid arthritis ([place])   . RLS (restless legs syndrome)    Surgical Hx:  Past Surgical History:  Procedure Laterality Date  . ABDOMINAL HYSTERECTOMY  Age 49 - 54   S/P TAH  . abdominal ultrasound  04/23/2004   Gallstones  . BREAST LUMPECTOMY WITH RADIOACTIVE SEED LOCALIZATION Right 10/2016   breast lumpectomy with radioactive seed localization and margin assessment for complex sclerosing lesion [facility])  . BREAST LUMPECTOMY WITH RADIOACTIVE SEED LOCALIZATION Right 11/05/2016   Procedure: RIGHT BREAST LUMPECTOMY WITH RADIOACTIVE SEED LOCALIZATION;  Surgeon: Fanny Skates, MD;  Location: [address];  Service: General;  Laterality: Right;  . CARDIOVERSION N/A 01/31/2017   Procedure: CARDIOVERSION;  Surgeon: Wellington Hampshire, MD;  Location: [facility];  Service: Cardiovascular;  Laterality: N/A;  . CARDIOVERSION N/A 03/03/2017   Procedure: CARDIOVERSION;  Surgeon: Wellington Hampshire, MD;  Location: [place] [facility];  Service: Cardiovascular;  Laterality: N/A;  . CARDIOVERSION N/A 04/02/2017   Procedure: CARDIOVERSION;  Surgeon: Wellington Hampshire, MD;  Location: [facility];  Service: Cardiovascular;  Laterality: N/A;  . CATARACT EXTRACTION  05/2010   left eye  . CERVICAL DISCECTOMY  1992   Fusion (Dr. Joya Salm)  . ESI Bilateral 01/2016   S1 transforaminal ESI x3  . ESOPHAGOGASTRODUODENOSCOPY  01/01/2002   with ulcer, bx. neg; + stricture gastric ulcer with hemorrhage  . ESOPHAGOGASTRODUODENOSCOPY  04/23/2004   Esophageal stricture -- dilated  . nuclear stress test  03/2012   no ischemia  . SKIN CANCER EXCISION  05/20/2011   BCC from nose  . US ECHOCARDIOGRAPHY  03/2012   in flutter, EF 60%, mild-mod aortic, mitral, tricuspid regurg,  mildly dilated LA   Family Hx:  Family History  Problem Relation Age of Onset  . Emphysema Mother   . Heart disease Father        MI  . Stroke Father        first at age 58.  . Stroke Sister   . Diabetes Sister   . Hypertension Brother   . Heart disease Brother        Heart Valve  . Stroke Brother   . Diabetes Brother   . Cancer Brother        esophageal  . Diabetes Brother   . Diabetes Brother    Social Hx:   Social History   Tobacco Use  . Smoking status: Former Smoker    Packs/day: 0.25    Years: 1.00    Pack years: 0.25    Last attempt to quit: 04/08/1976    Years since quitting: 41.0  . Smokeless tobacco: Never Used  . Tobacco  comment: pt smokes occasionally "socially"  Substance Use Topics  . Alcohol use: Yes    Alcohol/week: 1.2 oz    Types: 1 Standard drinks or equivalent, 1 Glasses of wine per week    Comment: occassional  . Drug use: No   Medication:    Current Outpatient Medications:  .  acetaminophen (TYLENOL) 500 MG tablet, Take 1,000 mg every 8 (eight) hours as needed by mouth for moderate pain. , Disp: , Rfl:  .  acyclovir (ZOVIRAX) 400 MG tablet, Take 400 mg 2 (two) times daily by mouth. , Disp: , Rfl:  .  alendronate (FOSAMAX) 70 MG tablet, Take 1 tablet (70 mg total) by mouth every 7 (seven) days. Take with a full glass of water on an empty stomach., Disp: 4 tablet, Rfl: 3 .  amiodarone (PACERONE) 200 MG tablet, Take 1 tablet (200 mg total) by mouth daily., Disp: 90 tablet, Rfl: 3 .  atorvastatin (LIPITOR) 20 MG tablet, TAKE ONE TABLET EVERY DAY, Disp: 90 tablet, Rfl: 1 .  calcium-vitamin D (OSCAL WITH D) 500-200 MG-UNIT per tablet, Take 1 tablet by mouth daily.  , Disp: , Rfl:  .  Cholecalciferol (VITAMIN D3) 1000 units CAPS, Take 1 capsule (1,000 Units total) by mouth daily., Disp: 30 capsule, Rfl:  .  DULoxetine (CYMBALTA) 20 MG capsule, Take 1 capsule (20 mg total) by mouth 2 (two) times daily., Disp: 60 capsule, Rfl: 3 .  ELIQUIS 5 MG TABS  tablet, TAKE ONE TABLET BY MOUTH TWICE DAILY, Disp: 60 tablet, Rfl: 6 .  ferrous sulfate 325 (65 FE) MG tablet, Take 1 tablet (325 mg total) by mouth daily with breakfast., Disp: , Rfl:  .  gabapentin (NEURONTIN) 100 MG capsule, Take 2 capsules (200 mg total) by mouth 2 (two) times daily., Disp: 120 capsule, Rfl: 3 .  metoprolol tartrate (LOPRESSOR) 25 MG tablet, TAKE 1 TABLET TWICE DAILY, Disp: 60 tablet, Rfl: 2 .  pantoprazole (PROTONIX) 40 MG tablet, TAKE ONE TABLET EVERY DAY, Disp: 30 tablet, Rfl: 6 .  prednisoLONE acetate (PRED FORTE) 1 % ophthalmic suspension, Place 1 drop into the left eye daily. , Disp: , Rfl:  .  rOPINIRole (REQUIP) 1 MG tablet, TAKE TWO TABLETS BY MOUTH AT BEDTIME, Disp: 60 tablet, Rfl: 3 .  tiZANidine (ZANAFLEX) 4 MG capsule, Take 4 mg daily as needed by mouth (leg cramps). , Disp: , Rfl:  .  traMADol (ULTRAM) 50 MG tablet, Take 50 mg by mouth 2 (two) times daily. , Disp: , Rfl:  .  vitamin B-12 (CYANOCOBALAMIN) 1000 MCG tablet, Take 1,000 mcg 3 (three) times a week by mouth. , Disp: , Rfl:    Allergies:  Penicillins  Review of Systems: Gen:  Denies  fever, sweats, chills HEENT: Denies blurred vision, double vision. bleeds, sore throat Cvc:  No dizziness, chest pain. Resp:   Denies cough or sputum production, shortness of breath Gi: Denies swallowing difficulty, stomach pain. Gu:  Denies bladder incontinence, burning urine Ext:   No Joint pain, stiffness. Skin: No skin rash,  hives  Endoc:  No polyuria, polydipsia. Psych: No depression, insomnia. Other:  All other systems were reviewed with the patient and were negative other that what is mentioned in the HPI.   Physical Examination:   VS: BP 128/80 (BP Location: Left Arm, Cuff Size: Normal)   Pulse (!) 55   Resp 16   Ht 5\' 2"  (1.575 m)   Wt 137 lb (62.1 kg)   SpO2 92%   BMI  25.06 kg/m   General Appearance: No distress  Neuro:without focal findings,  speech normal,  HEENT: PERRLA, EOM intact.     Pulmonary: normal breath sounds, No wheezing.  CardiovascularNormal S1,S2.  No m/r/g.   Abdomen: Benign, Soft, non-tender. Renal:  No costovertebral tenderness  GU:  No performed at this time. Endoc: No evident thyromegaly, no signs of acromegaly. Skin:   warm, no rashes, no ecchymosis  Extremities: normal, no cyanosis, clubbing.  Other findings:    LABORATORY PANEL:   CBC No results for input(s): WBC, HGB, HCT, PLT in the last 168 hours. ------------------------------------------------------------------------------------------------------------------  Chemistries  No results for input(s): NA, K, CL, CO2, GLUCOSE, BUN, CREATININE, CALCIUM, MG, AST, ALT, ALKPHOS, BILITOT in the last 168 hours.  Invalid input(s): GFRCGP ------------------------------------------------------------------------------------------------------------------  Cardiac Enzymes No results for input(s): TROPONINI in the last 168 hours. ------------------------------------------------------------  RADIOLOGY:  No results found.     Thank  you for the consultation and for allowing [facility] to assist in the care of your patient. Our recommendations are noted above.  Please contact us if we can be of further service.   Marda Stalker, MD.  Board Certified in Internal Medicine, Pulmonary Medicine, [place], and Sleep Medicine.  [facility] Office Number: [phone]  Patricia Pesa, M.D.  Merton Border, M.D  04/17/2017

## 2017-04-17 ENCOUNTER — Ambulatory Visit: Payer: Medicare Other | Admitting: Internal Medicine

## 2017-04-17 ENCOUNTER — Encounter: Payer: Self-pay | Admitting: Internal Medicine

## 2017-04-17 VITALS — BP 128/80 | HR 55 | Resp 16 | Ht 62.0 in | Wt 137.0 lb

## 2017-04-17 DIAGNOSIS — R0609 Other forms of dyspnea: Secondary | ICD-10-CM

## 2017-04-17 NOTE — Patient Instructions (Signed)
Will send you for a pulmonary function test.

## 2017-04-25 ENCOUNTER — Ambulatory Visit (INDEPENDENT_AMBULATORY_CARE_PROVIDER_SITE_OTHER): Payer: Medicare Other | Admitting: *Deleted

## 2017-04-25 VITALS — BP 120/80 | HR 51 | Resp 18

## 2017-04-25 DIAGNOSIS — I481 Persistent atrial fibrillation: Secondary | ICD-10-CM | POA: Diagnosis not present

## 2017-04-25 DIAGNOSIS — I4819 Other persistent atrial fibrillation: Secondary | ICD-10-CM

## 2017-04-25 NOTE — Progress Notes (Signed)
1.) Reason for visit: EKG  2.) Name of MD requesting visit: Christell Faith, PA  3.) H&P: Patient with a history of atrial fibrillation on amiodarone. She saw Thurmond Butts, [place] on 04/11/17 with a QTc of 491 on her EKG. Amiodarone was decreased to 200 mg once daily. Repeat EKG today shows NSR with PAC's- QTc of 482.   4.) ROS related to problem: The patient reports she is feeling really well as far as her heart is concerned s/p DCCV on 04/02/17. Her only complaint today is back pain- she is currently wearing a back brace.   5.) Assessment and plan per MD: EKG reviewed with Dr. Fletcher Anon. Per Dr. Fletcher Anon- mildly prolonged QTc. Recommendations are to continue the patient's current medications. She has follow up scheduled with Dr. Fletcher Anon on 05/20/17.

## 2017-04-25 NOTE — Patient Instructions (Signed)
Medication Instructions: - Your physician recommends that you continue on your current medications as directed. Please refer to the Current Medication list given to you today.  Labwork: - none ordered  Procedures/Testing: - none ordered  Follow-Up: - as scheduled with Dr. Fletcher Anon.  Any Additional Special Instructions Will Be Listed Below (If Applicable).     If you need a refill on your cardiac medications before your next appointment, please call your pharmacy.

## 2017-05-02 DIAGNOSIS — M48062 Spinal stenosis, lumbar region with neurogenic claudication: Secondary | ICD-10-CM | POA: Diagnosis not present

## 2017-05-02 DIAGNOSIS — M5416 Radiculopathy, lumbar region: Secondary | ICD-10-CM | POA: Diagnosis not present

## 2017-05-02 DIAGNOSIS — M7062 Trochanteric bursitis, left hip: Secondary | ICD-10-CM | POA: Diagnosis not present

## 2017-05-02 DIAGNOSIS — M5136 Other intervertebral disc degeneration, lumbar region: Secondary | ICD-10-CM | POA: Diagnosis not present

## 2017-05-02 DIAGNOSIS — M62830 Muscle spasm of back: Secondary | ICD-10-CM | POA: Diagnosis not present

## 2017-05-05 ENCOUNTER — Encounter: Payer: Self-pay | Admitting: Family Medicine

## 2017-05-13 NOTE — Telephone Encounter (Signed)
Spoke to pt who is agreeable to injection but states she will have to contact office back and schedule labs and prolia injection after reviewing schedule with her daughter

## 2017-05-13 NOTE — Telephone Encounter (Signed)
Verification of benefits have been processed and an approval has been received for pts prolia injection. Pts estimated cost are appx $260. This is only an estimate and cannot be confirmed until benefits are paid. Please advise pt and schedule if needed. If scheduled, once the injection is received, pls contact me back with the date it was received so that I am able to update prolia folder. thanks  

## 2017-05-16 ENCOUNTER — Encounter: Payer: Self-pay | Admitting: Gastroenterology

## 2017-05-16 ENCOUNTER — Ambulatory Visit: Payer: Medicare Other | Admitting: Gastroenterology

## 2017-05-16 VITALS — BP 120/80 | HR 62 | Ht 62.5 in | Wt 130.1 lb

## 2017-05-16 DIAGNOSIS — R1310 Dysphagia, unspecified: Secondary | ICD-10-CM

## 2017-05-16 DIAGNOSIS — Z7901 Long term (current) use of anticoagulants: Secondary | ICD-10-CM

## 2017-05-16 DIAGNOSIS — I4819 Other persistent atrial fibrillation: Secondary | ICD-10-CM

## 2017-05-16 NOTE — Progress Notes (Signed)
**[name]** Crystal [name]:  History: Crystal [name] 05/16/2017  Referring physician: Ria Bush, MD  Reason for consult/chief complaint: Dysphagia (esophagus dilated twice in the past. Trouble swallowing all solid foods and liquids) Dysphagia  Subjective  HPI:  This is an 84 year old woman referred by primary care noted above for dysphagia.  She appears to have seen Dr. Deatra Payne in 2005 for an unknown reason, those records are unavailable for review.  There does appear to be a radiologic encounter in November 2005 for "esophageal dilation", but no images or report.  This may have been a fluoroscopic guidance of the dilation. 2003 EGD with Crystal [name], 52mm maloney dilation of distal stricture. She has had chronic pill dysphagia, and the last couple of months some episodes that occurred with solid food especially meat.  Crystal [name] has A. fib and underwent her third cardioversion in December, now on amiodarone, maintained on chronic anticoagulation.  Crystal [name] is with her daughter today.  She reports several months of worsening dysphagia as described above.  She continues to have pill dysphagia.  There is no coughing or sputtering while eating or drinking.  It sounds like perhaps she also sometimes has food or liquid feel stuck in the chest, but less commonly than the feelings in the neck.  Her appetite has reportedly been good and weight stable.  ROS:  Review of Systems  Constitutional: Negative for appetite change and unexpected weight change.  HENT: Negative for mouth sores and voice change.   Eyes: Negative for pain and redness.  Respiratory: Negative for cough and shortness of breath.   Cardiovascular: Negative for chest pain and palpitations.  Genitourinary: Negative for dysuria and hematuria.  Musculoskeletal: Positive for arthralgias. Negative for myalgias.  Skin: Negative for pallor and rash.  Neurological: Negative for weakness and headaches.  Hematological:  Negative for adenopathy.   In the last several months she has had some falls  Past Medical History: Past Medical History:  Diagnosis Date  . Abdominal aortic atherosclerosis ([place]) 09/2015   by xray  . Anxiety   . BCC (basal cell carcinoma of skin) 05/2011   on nose  . Breast lesion    a. diagnosed with complex sclerosing lesion with calcifications of the right breast in 08/2016  . Chest pain    a. nuclear stress test 03/2012: normal  . DDD (degenerative disc disease), lumbosacral 12/06/1992  . Gallstones 09/2015   incidentally by xray  . GERD (gastroesophageal reflux disease) 05/1999  . History of shingles    ophthalmic, takes acyclovir daily  . Hyperlipidemia 12/2003  . Hypertension 1990  . Mitral regurgitation    a. echo 03/2012: EF 60-65%, no RWMA, mild to mod AI/MR, mod TR, PASP 37 mmHg  . Osteoarthritis 08/21/1989  . Osteoporosis with fracture 11/1997   with compression fracture T12  . Persistent atrial fibrillation ([place]) 03/16/2012   a. CHADS2VASc => 5 (HTN, age x 2, vascular disease, sex category)-->Eliquis 5 BID;  b. Recurrent AF in 06/2016;  c. 01/2017 s/p DCCV;  d. 02/2017 recurrent AFib->Amio added->DCCV; e. 03/2017 recurrent AFib, s/p reload of amio and DCCV 04/02/2017  . Rheumatoid arthritis ([place])   . RLS (restless legs syndrome)      Past Surgical History: Past Surgical History:  Procedure Laterality Date  . ABDOMINAL HYSTERECTOMY  Age 35 - 15   S/P TAH  . abdominal ultrasound  04/23/2004   Gallstones  . BREAST LUMPECTOMY WITH RADIOACTIVE SEED LOCALIZATION Right 10/2016   breast lumpectomy with radioactive seed localization  and margin assessment for complex sclerosing lesion [name])  . BREAST LUMPECTOMY WITH RADIOACTIVE SEED LOCALIZATION Right 11/05/2016   Procedure: RIGHT BREAST LUMPECTOMY WITH RADIOACTIVE SEED LOCALIZATION;  Surgeon: Crystal Skates, MD;  Location: [place];  Service: General;  Laterality: Right;  . CARDIOVERSION N/A  01/31/2017   Procedure: CARDIOVERSION;  Surgeon: Crystal Hampshire, MD;  Location: [facility];  Service: Cardiovascular;  Laterality: N/A;  . CARDIOVERSION N/A 03/03/2017   Procedure: CARDIOVERSION;  Surgeon: Crystal Hampshire, MD;  Location: [facility];  Service: Cardiovascular;  Laterality: N/A;  . CARDIOVERSION N/A 04/02/2017   Procedure: CARDIOVERSION;  Surgeon: Crystal Hampshire, MD;  Location: [facility];  Service: Cardiovascular;  Laterality: N/A;  . CATARACT EXTRACTION  05/2010   left eye  . CERVICAL DISCECTOMY  1992   Fusion (Dr. Joya Payne)  . ESI Bilateral 01/2016   S1 transforaminal ESI x3  . ESOPHAGOGASTRODUODENOSCOPY  01/01/2002   with ulcer, bx. neg; + stricture gastric ulcer with hemorrhage  . ESOPHAGOGASTRODUODENOSCOPY  04/23/2004   Esophageal stricture -- dilated  . nuclear stress test  03/2012   no ischemia  . SKIN CANCER EXCISION  05/20/2011   BCC from nose  . US ECHOCARDIOGRAPHY  03/2012   in flutter, EF 60%, mild-mod aortic, mitral, tricuspid regurg, mildly dilated LA     Family History: Family History  Problem Relation Age of Onset  . Emphysema Mother   . Heart disease Father        MI  . Stroke Father        first at age 41.  . Stroke Sister   . Diabetes Sister   . Hypertension Brother   . Heart disease Brother        Heart Valve  . Stroke Brother   . Diabetes Brother   . Cancer Brother        esophageal  . Diabetes Brother   . Diabetes Brother     Social History: Social History   Socioeconomic History  . Marital status: Widowed    Spouse name: None  . Number of children: 5  . Years of education: None  . Highest education level: None  Social Needs  . Financial resource strain: None  . Food insecurity - worry: None  . Food insecurity - inability: None  . Transportation needs - medical: None  . Transportation needs - non-medical: None  Occupational History  . Occupation: Retired - Occupational psychologist: RETIRED  Tobacco Use  . Smoking status:  Former Smoker    Packs/day: 0.25    Years: 1.00    Pack years: 0.25    Last attempt to quit: 04/08/1976    Years since quitting: 41.1  . Smokeless tobacco: Never Used  . Tobacco comment: pt smokes occasionally "socially"  Substance and Sexual Activity  . Alcohol use: Yes    Alcohol/week: 1.2 oz    Types: 1 Standard drinks or equivalent, 1 Glasses of wine per week    Comment: occassional  . Drug use: No  . Sexual activity: No  Other Topics Concern  . None  Social History Narrative   Lives with grandson who smokes, dog   From [name].  Widowed 01/1999; 1st marriage 25 years / 2nd marriage 13 years.   One son deceased 01/24/2023.   Activity: active with dog   Diet: good water, fruits/vegetables daily    Allergies: Allergies  Allergen Reactions  . Penicillins Rash    Childhood Allergy Has patient had a PCN  reaction causing immediate rash, facial/tongue/throat swelling, SOB or lightheadedness with hypotension: Yes Has patient had a PCN reaction causing severe rash involving mucus membranes or skin necrosis: Unknown Has patient had a PCN reaction that required hospitalization: No Has patient had a PCN reaction occurring within the last 10 years: No If all of the above answers are "NO", then may proceed with Cephalosporin use.     Outpatient Meds: Current Outpatient Medications  Medication Sig Dispense Refill  . acetaminophen (TYLENOL) 500 MG tablet Take 1,000 mg every 8 (eight) hours as needed by mouth for moderate pain.     Marland Kitchen acyclovir (ZOVIRAX) 400 MG tablet Take 400 mg 2 (two) times daily by mouth.     Marland Kitchen alendronate (FOSAMAX) 70 MG tablet Take 1 tablet (70 mg total) by mouth every 7 (seven) days. Take with a full glass of water on an empty stomach. 4 tablet 3  . amiodarone (PACERONE) 200 MG tablet Take 1 tablet (200 mg total) by mouth daily. 90 tablet 3  . atorvastatin (LIPITOR) 20 MG tablet TAKE ONE TABLET EVERY DAY 90 tablet 1  . calcium-vitamin D (OSCAL WITH D) 500-200  MG-UNIT per tablet Take 1 tablet by mouth daily.      . Cholecalciferol (VITAMIN D3) 1000 units CAPS Take 1 capsule (1,000 Units total) by mouth daily. 30 capsule   . DULoxetine (CYMBALTA) 20 MG capsule Take 1 capsule (20 mg total) by mouth 2 (two) times daily. 60 capsule 3  . ELIQUIS 5 MG TABS tablet TAKE ONE TABLET BY MOUTH TWICE DAILY 60 tablet 6  . ferrous sulfate 325 (65 FE) MG tablet Take 1 tablet (325 mg total) by mouth daily with breakfast.    . gabapentin (NEURONTIN) 100 MG capsule Take 2 capsules (200 mg total) by mouth 2 (two) times daily. 120 capsule 3  . metoprolol tartrate (LOPRESSOR) 25 MG tablet TAKE 1 TABLET TWICE DAILY 60 tablet 2  . pantoprazole (PROTONIX) 40 MG tablet TAKE ONE TABLET EVERY DAY 30 tablet 6  . prednisoLONE acetate (PRED FORTE) 1 % ophthalmic suspension Place 1 drop into the left eye daily.     Marland Kitchen rOPINIRole (REQUIP) 1 MG tablet TAKE TWO TABLETS BY MOUTH AT BEDTIME 60 tablet 3  . tiZANidine (ZANAFLEX) 4 MG capsule Take 4 mg daily as needed by mouth (leg cramps).     . traMADol (ULTRAM) 50 MG tablet Take 50 mg by mouth 2 (two) times daily.     . vitamin B-12 (CYANOCOBALAMIN) 1000 MCG tablet Take 1,000 mcg 3 (three) times a week by mouth.      No current facility-administered medications for this visit.       ___________________________________________________________________ Objective   Exam:  BP 120/80   Pulse 62   Ht 5' 2.5" (1.588 m)   Wt 130 lb 1.6 oz (59 kg)   BMI 23.42 kg/m    General: this is a(n) elderly woman with normal vocal quality, slow antalgic gait, uses a cane and gets on exam table with assistance.  Eyes: sclera anicteric, no redness  ENT: oral mucosa moist without lesions, no cervical or supraclavicular lymphadenopathy, good dentition (dentures)  Scar left side neck from cervical spine surgery  CV: RRR without murmur, S1/S2, no JVD, no peripheral edema  Resp: clear to auscultation bilaterally, normal RR and effort  noted  GI: soft, no tenderness, with active bowel sounds. No guarding or palpable organomegaly noted.  Skin; warm and dry, no rash or jaundice noted  Neuro: awake, alert and  oriented x 3. Normal gross motor function and fluent speech  Labs:  No recent data for review. Previous endoscopy reports as noted above  Assessment: Encounter Diagnosis  Name Primary?  Marland Kitchen Dysphagia, unspecified type Yes  This sounds primarily like oral pharyngeal dysphagia, less frequently esophageal dysphagia.  I am most suspicious of cricopharyngeal dysfunction, possibly extrinsic compression from cervical spine surgery.  Possibly intrinsic stricture.    Plan:  Barium swallow to define anatomy better and plan subsequent EGD.  It might need to be done in the hospital Endo lab if fluoroscopy is required for dilation of UES stricture.  She would also need to be off Xarelto 2 days prior, increasing complexity and risk of procedure.  Thank you for the courtesy of this consult.  Please call me with any questions or concerns.  Nelida Meuse III  CC: [place], MD

## 2017-05-16 NOTE — Patient Instructions (Addendum)
If you are age 82 or older, your body mass index should be between 23-30. Your Body mass index is 23.42 kg/m. If this is out of the aforementioned range listed, please consider follow up with your Primary Care Provider.  If you are age 43 or younger, your body mass index should be between 19-25. Your Body mass index is 23.42 kg/m. If this is out of the aformentioned range listed, please consider follow up with your Primary Care Provider.   You have been scheduled for a Barium Esophogram at [facility] on 05-20-2017 at 930am. Please arrive 15 minutes prior to your appointment for registration. Make certain not to have anything to eat or drink 3 hours prior to your test. If you need to reschedule for any reason, please contact radiology at [phone] to do so. __________________________________________________________________ A barium swallow is an examination that concentrates on views of the esophagus. This tends to be a double contrast exam (barium and two liquids which, when combined, create a gas to distend the wall of the oesophagus) or single contrast (non-ionic iodine based). The study is usually tailored to your symptoms so a good history is essential. Attention is paid during the study to the form, structure and configuration of the esophagus, looking for functional disorders (such as aspiration, dysphagia, achalasia, motility and reflux) EXAMINATION You may be asked to change into a gown, depending on the type of swallow being performed. A radiologist and radiographer will perform the procedure. The radiologist will advise you of the type of contrast selected for your procedure and direct you during the exam. You will be asked to stand, sit or lie in several different positions and to hold a small amount of fluid in your mouth before being asked to swallow while the imaging is performed .In some instances you may be asked to swallow barium coated marshmallows to assess the  motility of a solid food bolus. The exam can be recorded as a digital or video fluoroscopy procedure. POST PROCEDURE It will take 1-2 days for the barium to pass through your system. To facilitate this, it is important, unless otherwise directed, to increase your fluids for the next 24-48hrs and to resume your normal diet.  This test typically takes about 30 minutes to perform. __________________________________________________________________________________   Thank you for choosing [facility]  Dr Wilfrid Lund III

## 2017-05-19 ENCOUNTER — Telehealth: Payer: Self-pay | Admitting: Family Medicine

## 2017-05-19 ENCOUNTER — Other Ambulatory Visit (INDEPENDENT_AMBULATORY_CARE_PROVIDER_SITE_OTHER): Payer: Medicare Other

## 2017-05-19 ENCOUNTER — Other Ambulatory Visit: Payer: Self-pay | Admitting: Family Medicine

## 2017-05-19 DIAGNOSIS — M8000XD Age-related osteoporosis with current pathological fracture, unspecified site, subsequent encounter for fracture with routine healing: Secondary | ICD-10-CM

## 2017-05-19 LAB — VITAMIN D 25 HYDROXY (VIT D DEFICIENCY, FRACTURES): VITD: 19.1 ng/mL — AB (ref 30.00–100.00)

## 2017-05-19 LAB — CALCIUM: CALCIUM: 8.8 mg/dL (ref 8.4–10.5)

## 2017-05-19 MED ORDER — VITAMIN D3 1.25 MG (50000 UT) PO TABS: 1.0000 | ORAL_TABLET | ORAL | 1 refills | Status: DC

## 2017-05-19 NOTE — Telephone Encounter (Signed)
Pt here for prolia labwork. I have ordered Ca and vit D.

## 2017-05-20 ENCOUNTER — Ambulatory Visit: Payer: Medicare Other | Admitting: Cardiovascular Disease

## 2017-05-20 ENCOUNTER — Encounter: Payer: Self-pay | Admitting: Cardiovascular Disease

## 2017-05-20 ENCOUNTER — Ambulatory Visit
Admission: RE | Admit: 2017-05-20 | Discharge: 2017-05-20 | Disposition: A | Discharge status: Home / Self Care | Payer: Medicare Other | Source: Ambulatory Visit | Attending: Gastroenterology | Admitting: Gastroenterology

## 2017-05-20 VITALS — BP 140/74 | HR 47 | Ht 62.0 in | Wt 131.0 lb

## 2017-05-20 DIAGNOSIS — E785 Hyperlipidemia, unspecified: Secondary | ICD-10-CM

## 2017-05-20 DIAGNOSIS — I481 Persistent atrial fibrillation: Secondary | ICD-10-CM | POA: Diagnosis not present

## 2017-05-20 DIAGNOSIS — I10 Essential (primary) hypertension: Secondary | ICD-10-CM | POA: Diagnosis not present

## 2017-05-20 DIAGNOSIS — K228 Other specified diseases of esophagus: Secondary | ICD-10-CM | POA: Diagnosis not present

## 2017-05-20 DIAGNOSIS — I4819 Other persistent atrial fibrillation: Secondary | ICD-10-CM

## 2017-05-20 DIAGNOSIS — I700 Atherosclerosis of aorta: Secondary | ICD-10-CM | POA: Diagnosis not present

## 2017-05-20 DIAGNOSIS — K449 Diaphragmatic hernia without obstruction or gangrene: Secondary | ICD-10-CM | POA: Diagnosis not present

## 2017-05-20 DIAGNOSIS — R1310 Dysphagia, unspecified: Secondary | ICD-10-CM | POA: Diagnosis not present

## 2017-05-20 MED ORDER — METOPROLOL SUCCINATE ER 25 MG PO TB24: 25.0000 mg | ORAL_TABLET | Freq: Every day | ORAL | 3 refills | Status: DC

## 2017-05-20 NOTE — Patient Instructions (Addendum)
Medication Instructions:  Your physician has recommended you make the following change in your medication:  STOP taking metoprolol tartrate START taking metoprolol succinate 25mg  once daily - [facility]   Labwork: none  Testing/Procedures: none  Follow-Up: Your physician recommends that you schedule a follow-up appointment in: 3 months with Christell Faith, PA-C   Any Other Special Instructions Will Be Listed Below (If Applicable).     If you need a refill on your cardiac medications before your next appointment, please call your pharmacy.

## 2017-05-20 NOTE — Progress Notes (Signed)
Cardiology Office Note   Date:  05/20/2017   ID:  [name], DOB 07-19-1929, MRN [id number]  PCP:  Ria Bush, MD  Cardiologist:   [name], MD   Chief Complaint  Patient presents with  . Other    1 month follow up. Patient c/o when going to bed feet stay cold and get tired easily. Meds reviewed verbally with patient.       History of Present Illness: [name] is a 84 y.o. female who presents for a follow-up visit regarding persistent atrial fibrillation maintained in sinus rhythm with amiodarone. Nuclear stress test in 03/2012 showed no evidence of ischemia. Echocardiogram in 2013 showed normal LV systolic function, mild to moderate aortic and mitral regurgitation, moderate tricuspid regurgitation with mild pulmonary hypertension.   She had recurrent A. fib with RVR last year and we decided to start her on amiodarone.  Most recent cardioversion was in December 2018 after loading with amiodarone.  No recurrent atrial arrhythmia since then.  She has been doing reasonably well from a cardiac standpoint with no chest pain.  She reports stable exertional dyspnea.  No palpitations.  She does complain of dizziness especially when she tries to stand up.  She continues to be bradycardic. She had recent issues with dysphagia and currently getting workup for that.  Past Medical History:  Diagnosis Date  . Abdominal aortic atherosclerosis ([place]) 09/2015   by xray  . Anxiety   . BCC (basal cell carcinoma of skin) 05/2011   on nose  . Breast lesion    a. diagnosed with complex sclerosing lesion with calcifications of the right breast in 08/2016  . Chest pain    a. nuclear stress test 03/2012: normal  . DDD (degenerative disc disease), lumbosacral 12/06/1992  . Gallstones 09/2015   incidentally by xray  . GERD (gastroesophageal reflux disease) 05/1999  . History of shingles    ophthalmic, takes acyclovir daily  . Hyperlipidemia 12/2003  . Hypertension 1990  .  Mitral regurgitation    a. echo 03/2012: EF 60-65%, no RWMA, mild to mod AI/MR, mod TR, PASP 37 mmHg  . Osteoarthritis 08/21/1989  . Osteoporosis with fracture 11/1997   with compression fracture T12  . Persistent atrial fibrillation ([place]) 03/16/2012   a. CHADS2VASc => 5 (HTN, age x 2, vascular disease, sex category)-->Eliquis 5 BID;  b. Recurrent AF in 06/2016;  c. 01/2017 s/p DCCV;  d. 02/2017 recurrent AFib->Amio added->DCCV; e. 03/2017 recurrent AFib, s/p reload of amio and DCCV 04/02/2017  . Rheumatoid arthritis ([place])   . RLS (restless legs syndrome)     Past Surgical History:  Procedure Laterality Date  . ABDOMINAL HYSTERECTOMY  Age 84 - 29   S/P TAH  . abdominal ultrasound  04/23/2004   Gallstones  . BREAST LUMPECTOMY WITH RADIOACTIVE SEED LOCALIZATION Right 10/2016   breast lumpectomy with radioactive seed localization and margin assessment for complex sclerosing lesion [facility])  . BREAST LUMPECTOMY WITH RADIOACTIVE SEED LOCALIZATION Right 11/05/2016   Procedure: RIGHT BREAST LUMPECTOMY WITH RADIOACTIVE SEED LOCALIZATION;  Surgeon: Fanny Skates, MD;  Location: [facility];  Service: General;  Laterality: Right;  . CARDIOVERSION N/A 01/31/2017   Procedure: CARDIOVERSION;  Surgeon: Wellington Hampshire, MD;  Location: [facility];  Service: Cardiovascular;  Laterality: N/A;  . CARDIOVERSION N/A 03/03/2017   Procedure: CARDIOVERSION;  Surgeon: Wellington Hampshire, MD;  Location: [facility];  Service: Cardiovascular;  Laterality: N/A;  . CARDIOVERSION N/A 04/02/2017   Procedure: CARDIOVERSION;  Surgeon: Wellington Hampshire, MD;  Location: [facility];  Service: Cardiovascular;  Laterality: N/A;  . CATARACT EXTRACTION  05/2010   left eye  . CERVICAL DISCECTOMY  1992   Fusion (Dr. Joya Salm)  . ESI Bilateral 01/2016   S1 transforaminal ESI x3  . ESOPHAGOGASTRODUODENOSCOPY  01/01/2002   with ulcer, bx. neg; + stricture gastric ulcer with hemorrhage  . ESOPHAGOGASTRODUODENOSCOPY   04/23/2004   Esophageal stricture -- dilated  . nuclear stress test  03/2012   no ischemia  . SKIN CANCER EXCISION  05/20/2011   BCC from nose  . US ECHOCARDIOGRAPHY  03/2012   in flutter, EF 60%, mild-mod aortic, mitral, tricuspid regurg, mildly dilated LA     Current Outpatient Medications  Medication Sig Dispense Refill  . acetaminophen (TYLENOL) 500 MG tablet Take 1,000 mg every 8 (eight) hours as needed by mouth for moderate pain.     Marland Kitchen acyclovir (ZOVIRAX) 400 MG tablet Take 400 mg 2 (two) times daily by mouth.     Marland Kitchen alendronate (FOSAMAX) 70 MG tablet Take 1 tablet (70 mg total) by mouth every 7 (seven) days. Take with a full glass of water on an empty stomach. 4 tablet 3  . amiodarone (PACERONE) 200 MG tablet Take 1 tablet (200 mg total) by mouth daily. 90 tablet 3  . atorvastatin (LIPITOR) 20 MG tablet TAKE ONE TABLET EVERY DAY 90 tablet 1  . calcium-vitamin D (OSCAL WITH D) 500-200 MG-UNIT per tablet Take 1 tablet by mouth daily.      . Cholecalciferol (VITAMIN D3) 50000 units TABS Take 1 tablet by mouth once a week. 12 tablet 1  . DULoxetine (CYMBALTA) 20 MG capsule Take 1 capsule (20 mg total) by mouth 2 (two) times daily. 60 capsule 3  . ELIQUIS 5 MG TABS tablet TAKE ONE TABLET BY MOUTH TWICE DAILY 60 tablet 6  . ferrous sulfate 325 (65 FE) MG tablet Take 1 tablet (325 mg total) by mouth daily with breakfast.    . gabapentin (NEURONTIN) 100 MG capsule Take 2 capsules (200 mg total) by mouth 2 (two) times daily. 120 capsule 3  . pantoprazole (PROTONIX) 40 MG tablet TAKE ONE TABLET EVERY DAY 30 tablet 6  . prednisoLONE acetate (PRED FORTE) 1 % ophthalmic suspension Place 1 drop into the left eye daily.     Marland Kitchen rOPINIRole (REQUIP) 1 MG tablet TAKE TWO TABLETS BY MOUTH AT BEDTIME 60 tablet 3  . tiZANidine (ZANAFLEX) 4 MG capsule Take 4 mg daily as needed by mouth (leg cramps).     . traMADol (ULTRAM) 50 MG tablet Take 50 mg by mouth 2 (two) times daily.     . vitamin B-12  (CYANOCOBALAMIN) 1000 MCG tablet Take 1,000 mcg 3 (three) times a week by mouth.      No current facility-administered medications for this visit.     Allergies:   Penicillins    Social History:  The patient  reports that she quit smoking about 41 years ago. She has a 0.25 pack-year smoking history. she has never used smokeless tobacco. She reports that she does not drink alcohol or use drugs.   Family History:  The patient's family history includes Cancer in her brother; Diabetes in her brother, brother, brother, and sister; Emphysema in her mother; Heart disease in her brother and father; Hypertension in her brother; Stroke in her brother, father, and sister.    ROS:  Please see the history of present illness.   Otherwise, review of systems are  positive for none.   All other systems are reviewed and negative.    PHYSICAL EXAM: VS:  BP 140/74 (BP Location: Right Arm, Patient Position: Sitting, Cuff Size: Normal)   Pulse (!) 47   Ht 5\' 2"  (1.575 m)   Wt 131 lb (59.4 kg)   BMI 23.96 kg/m  , BMI Body mass index is 23.96 kg/m. GEN: Well nourished, well developed, in no acute distress  HEENT: normal  Neck: no JVD, carotid bruits, or masses Cardiac: Regular rate and rhythm, mild bradycardia.; no murmurs, rubs, or gallops,no edema  Respiratory:  clear to auscultation bilaterally, normal work of breathing GI: soft, nontender, nondistended, + BS MS: no deformity or atrophy  Skin: warm and dry, no rash Neuro:  Strength and sensation are intact Psych: euthymic mood, full affect  EKG:  EKG is ordered today. The ekg ordered today demonstrates sinus bradycardia with a heart rate of 47 bpm.  LVH with repolarization abnormalities.   Recent Labs: 03/25/2017: ALT 15; BUN 11; Creatinine, Ser 0.72; Hemoglobin 13.3; Magnesium 2.0; Platelets 268; Potassium 3.9; Sodium 142; TSH 4.200    Lipid Panel    Component Value Date/Time   CHOL 195 05/28/2016 1253   TRIG 107.0 05/28/2016 1253   HDL  53.70 05/28/2016 1253   CHOLHDL 4 05/28/2016 1253   VLDL 21.4 05/28/2016 1253   LDLCALC 120 (H) 05/28/2016 1253   LDLDIRECT 173.1 02/19/2010 0908      Wt Readings from Last 3 Encounters:  05/20/17 131 lb (59.4 kg)  05/16/17 130 lb 1.6 oz (59 kg)  04/17/17 137 lb (62.1 kg)       No flowsheet data found.    ASSESSMENT AND PLAN:  1. Persistental atrial fibrillation:   She is maintaining in sinus rhythm with amiodarone.  She continues to be bradycardic with mild dizziness.  I elected to switch metoprolol tartrate to metoprolol succinate 25 mg once daily.  If she continues to be bradycardic upon follow-up in 3 months, we might need to stop this medication and keep her on amiodarone alone.   She is tolerating anticoagulation with Eliquis with no bleeding side effects.   2. Essential hypertension: Blood pressure is reasonably controlled.  3. Hyperlipidemia: Currently on atorvastatin 20 mg once daily with most recent LDL of 75.    Disposition:   FU with Ryan in 3  months  Signed,  [name], MD  05/20/2017 3:41 PM    [place]

## 2017-05-26 ENCOUNTER — Telehealth: Payer: Self-pay | Admitting: Gastroenterology

## 2017-05-26 NOTE — Telephone Encounter (Signed)
Left message for daughter that I had spoke to her mom this morning and gave her the test results. She wanted to make a follow up appointment to see Dr. Loletha Carrow, so patient is to have follow up visit on 3/26 at 4:00.

## 2017-05-26 NOTE — Telephone Encounter (Signed)
Patient daughter returning call requesting nurse Almyra Free speak with her.

## 2017-05-26 NOTE — Telephone Encounter (Signed)
Called daughter back, let her know I had left a message for her mom last Friday. Spoke to patient this morning with her results and she has opted to make a follow up appointment in the office.

## 2017-05-26 NOTE — Telephone Encounter (Signed)
Patient daughter states patient had barium swallow test done last week and wants to know what next steps are for patient.

## 2017-05-29 ENCOUNTER — Ambulatory Visit (INDEPENDENT_AMBULATORY_CARE_PROVIDER_SITE_OTHER): Payer: Medicare Other

## 2017-05-29 DIAGNOSIS — M810 Age-related osteoporosis without current pathological fracture: Secondary | ICD-10-CM

## 2017-05-29 MED ORDER — DENOSUMAB 60 MG/ML ~~LOC~~ SOLN
60.0000 mg | Freq: Once | SUBCUTANEOUS | Status: AC
  Administered 2017-05-29: 60 mg via SUBCUTANEOUS

## 2017-05-29 NOTE — Telephone Encounter (Signed)
Pt received first Prolia inj today.

## 2017-07-01 ENCOUNTER — Encounter: Payer: Self-pay | Admitting: Gastroenterology

## 2017-07-01 ENCOUNTER — Ambulatory Visit: Payer: Medicare Other | Admitting: Gastroenterology

## 2017-07-01 VITALS — BP 130/68 | HR 63 | Ht 61.5 in | Wt 127.6 lb

## 2017-07-01 DIAGNOSIS — K224 Dyskinesia of esophagus: Secondary | ICD-10-CM | POA: Diagnosis not present

## 2017-07-01 DIAGNOSIS — R1310 Dysphagia, unspecified: Secondary | ICD-10-CM | POA: Diagnosis not present

## 2017-07-01 DIAGNOSIS — R634 Abnormal weight loss: Secondary | ICD-10-CM

## 2017-07-01 DIAGNOSIS — R1319 Other dysphagia: Secondary | ICD-10-CM

## 2017-07-01 NOTE — Progress Notes (Signed)
[facility] Progress Note  Chief Complaint: Dysphagia  Subjective  History:  Indica follows up after her barium study last month.  She is accompanied by her daughter as always. She has dysphagia to some pills and some foods, mostly meat.  However, she reports a wide variety of foods she is able to swallow without difficulty such as mashed potatoes, meat loaf, bread, milk shakes and other things.  She is unable to swallow steak.  She has also been losing weight, though it sounds like there may be a psychosocial component to this.  She is upset by her lack of mobility and functional status, this affects her mood and some days she will go back to bed 2 or 3 times and then missed meals.  Her daughter confirms that she eats well when she feels like doing so.  ROS: Cardiovascular:  no chest pain Respiratory: no dyspnea  The patient's Past Medical, Family and Social History were reviewed and are on file in the EMR.  Objective:  Med list reviewed  Current Outpatient Medications:  .  acetaminophen (TYLENOL) 500 MG tablet, Take 1,000 mg every 8 (eight) hours as needed by mouth for moderate pain. , Disp: , Rfl:  .  acyclovir (ZOVIRAX) 400 MG tablet, Take 400 mg 2 (two) times daily by mouth. , Disp: , Rfl:  .  alendronate (FOSAMAX) 70 MG tablet, Take 1 tablet (70 mg total) by mouth every 7 (seven) days. Take with a full glass of water on an empty stomach., Disp: 4 tablet, Rfl: 3 .  amiodarone (PACERONE) 200 MG tablet, Take 1 tablet (200 mg total) by mouth daily., Disp: 90 tablet, Rfl: 3 .  atorvastatin (LIPITOR) 20 MG tablet, TAKE ONE TABLET EVERY DAY, Disp: 90 tablet, Rfl: 1 .  calcium-vitamin D (OSCAL WITH D) 500-200 MG-UNIT per tablet, Take 1 tablet by mouth daily.  , Disp: , Rfl:  .  Cholecalciferol (VITAMIN D3) 50000 units TABS, Take 1 tablet by mouth once a week., Disp: 12 tablet, Rfl: 1 .  DULoxetine (CYMBALTA) 20 MG capsule, Take 1 capsule (20 mg total) by mouth 2 (two) times  daily., Disp: 60 capsule, Rfl: 3 .  ELIQUIS 5 MG TABS tablet, TAKE ONE TABLET BY MOUTH TWICE DAILY, Disp: 60 tablet, Rfl: 6 .  ferrous sulfate 325 (65 FE) MG tablet, Take 1 tablet (325 mg total) by mouth daily with breakfast., Disp: , Rfl:  .  gabapentin (NEURONTIN) 100 MG capsule, Take 2 capsules (200 mg total) by mouth 2 (two) times daily., Disp: 120 capsule, Rfl: 3 .  metoprolol succinate (TOPROL-XL) 25 MG 24 hr tablet, Take 1 tablet (25 mg total) by mouth daily. Take with or immediately following a meal., Disp: 90 tablet, Rfl: 3 .  pantoprazole (PROTONIX) 40 MG tablet, TAKE ONE TABLET EVERY DAY, Disp: 30 tablet, Rfl: 6 .  prednisoLONE acetate (PRED FORTE) 1 % ophthalmic suspension, Place 1 drop into the left eye daily. , Disp: , Rfl:  .  rOPINIRole (REQUIP) 1 MG tablet, TAKE TWO TABLETS BY MOUTH AT BEDTIME, Disp: 60 tablet, Rfl: 3 .  tiZANidine (ZANAFLEX) 4 MG capsule, Take 4 mg daily as needed by mouth (leg cramps). , Disp: , Rfl:  .  traMADol (ULTRAM) 50 MG tablet, Take 50 mg by mouth 2 (two) times daily. , Disp: , Rfl:  .  vitamin B-12 (CYANOCOBALAMIN) 1000 MCG tablet, Take 1,000 mcg 3 (three) times a week by mouth. , Disp: , Rfl:    Vital  signs in last 24 hrs: Vitals:   07/01/17 1609  BP: 130/68  Pulse: 63    Physical Exam  Alert, pleasant and conversational.  Gets on exam table without assistance.  HEENT: sclera anicteric, oral mucosa moist without lesions.  Normal vocal quality  Neck: supple, no thyromegaly, JVD or lymphadenopathy  Cardiac: RRR without murmurs, S1S2 heard, no peripheral edema  Pulm: clear to auscultation bilaterally, normal RR and effort noted  Abdomen: soft, no tenderness, with active bowel sounds. No guarding or palpable hepatosplenomegaly.  Skin; warm and dry, no jaundice or rash   Radiologic studies:  Presbyesophagus on barium swallow.   There is a hiatal hernia.  UES opens well.  There is some to-and fro-motility in the barium tablet passes  well.  I reviewed the images personally.  @ASSESSMENTPLANBEGIN @ Assessment: Encounter Diagnoses  Name Primary?  . Esophageal dysphagia Yes  . Esophageal motility disorder   . Abnormal loss of weight    Esophageal dysmotility.  It does not seem like achalasia, and there is no clear fixed mechanical stricture on this imaging that would be amenable to endoscopic dilatation.  Her age cardiac condition and anticoagulation would make for an increased risk procedure, and I think it would be of doubtful benefit at this point.  Therefore I am not planning upper endoscopy.  It sounds like she is able to eat most things, and I recommended she take medicines with applesauce or yogurt, and also swallow with a chin tuck maneuver. Weight loss sounds as if it may be depression related.  I explained all this in detail to her and her daughter and all their questions were answered. I will see her as needed.   Total time 20 minutes, over half spent in counseling and coordination of care.   [name]

## 2017-07-01 NOTE — Patient Instructions (Signed)
If you are age 84 or older, your body mass index should be between 23-30. Your Body mass index is 23.72 kg/m. If this is out of the aforementioned range listed, please consider follow up with your Primary Care Provider.  If you are age 37 or younger, your body mass index should be between 19-25. Your Body mass index is 23.72 kg/m. If this is out of the aformentioned range listed, please consider follow up with your Primary Care Provider.   Follow up as needed. [phone]  Thank you for choosing [facility]  Dr Wilfrid Lund III

## 2017-07-03 ENCOUNTER — Other Ambulatory Visit: Payer: Self-pay | Admitting: Family Medicine

## 2017-07-04 NOTE — Telephone Encounter (Signed)
Electronic refill request Last office visit 04/07/17 Last refill 11/11/16 #120/3

## 2017-07-11 ENCOUNTER — Other Ambulatory Visit: Payer: Self-pay | Admitting: Family Medicine

## 2017-07-16 ENCOUNTER — Encounter: Payer: Self-pay | Admitting: Internal Medicine

## 2017-07-16 ENCOUNTER — Ambulatory Visit: Payer: Medicare Other | Admitting: Internal Medicine

## 2017-07-16 VITALS — BP 118/72 | HR 58 | Resp 16 | Ht 61.5 in | Wt 129.0 lb

## 2017-07-16 DIAGNOSIS — R0609 Other forms of dyspnea: Secondary | ICD-10-CM | POA: Diagnosis not present

## 2017-07-16 NOTE — Patient Instructions (Addendum)
Glad you are doing better, you can follow up here as needed for respiratory issues.

## 2017-07-16 NOTE — Progress Notes (Signed)
[facility] Consultation      Assessment and Plan:  Dyspnea on exertion. -Patient has mild dyspnea on exertion, which appears to have improved today, status post cardioversion.  Chronic bronchitis. - This appears to be minimally symptomatic at this time, no treatment is recommended. - Patient was scheduled to have a pulmonary function test, but the test was not done.  She appears to be doing better this is canceled.  Esophageal dysphasia. -This may be contributing to some of the patient's dyspnea via aspiration pneumonitis. - Patient has an appointment to see gastroenterology for esophageal stretching which she has undergone in the past.   Return if symptoms recur..    Date: 07/16/2017  MRN# [id number] [name] 1929-09-18    [name] is a 84 y.o. old female seen in consultation for chief complaint of:    Chief Complaint  Patient presents with  . Shortness of Breath    pt states dyspnea has improved. PFT was never scheduled.    HPI:   The patient is an 84 year old female, last visit she was noted to have dyspnea on exertion with debility, deconditioning, possible chronic otitis, and cardiac disease.  She was also noted to have dysphagia, referred to GI.  Patient was seen and it was determined she had predominantly dysmotility without evidence of a physical obstruction, therefore did not undergo endoscopy.  She was sent for a PFT but this is not yet been done.  Since her last visit she underwent a cardioversion and since that time she notices that her breathing is much better. She feels that it is back normal and she has no new complaints today other than hip problems.   She has a dog, sleeps in bed with her.     Desat walk 04/17/17; on ra at rest sat is 98% HR 54; walked 180 feet at slow stumbling pace, sat was 94% and HR 62, conversational, mild dyspnea.   CBC 10/31/16; eosinophils equals 200. Chest x-ray 08/25/13; lungs slightly  hyperinflated, scoliosis.  Medication:    Current Outpatient Medications:  .  acetaminophen (TYLENOL) 500 MG tablet, Take 1,000 mg every 8 (eight) hours as needed by mouth for moderate pain. , Disp: , Rfl:  .  acyclovir (ZOVIRAX) 400 MG tablet, Take 400 mg 2 (two) times daily by mouth. , Disp: , Rfl:  .  amiodarone (PACERONE) 200 MG tablet, Take 1 tablet (200 mg total) by mouth daily., Disp: 90 tablet, Rfl: 3 .  atorvastatin (LIPITOR) 20 MG tablet, TAKE ONE TABLET EVERY DAY, Disp: 90 tablet, Rfl: 1 .  calcium-vitamin D (OSCAL WITH D) 500-200 MG-UNIT per tablet, Take 1 tablet by mouth daily.  , Disp: , Rfl:  .  Cholecalciferol (VITAMIN D3) 50000 units TABS, Take 1 tablet by mouth once a week., Disp: 12 tablet, Rfl: 1 .  DULoxetine (CYMBALTA) 20 MG capsule, Take 1 capsule (20 mg total) by mouth 2 (two) times daily., Disp: 60 capsule, Rfl: 3 .  ELIQUIS 5 MG TABS tablet, TAKE ONE TABLET BY MOUTH TWICE DAILY, Disp: 60 tablet, Rfl: 6 .  gabapentin (NEURONTIN) 100 MG capsule, TAKE 2 CAPSULES BY MOUTH TWICE DAILY, Disp: 120 capsule, Rfl: 3 .  metoprolol succinate (TOPROL-XL) 25 MG 24 hr tablet, Take 1 tablet (25 mg total) by mouth daily. Take with or immediately following a meal., Disp: 90 tablet, Rfl: 3 .  pantoprazole (PROTONIX) 40 MG tablet, TAKE ONE TABLET EVERY DAY, Disp: 30 tablet, Rfl: 6 .  prednisoLONE acetate (  PRED FORTE) 1 % ophthalmic suspension, Place 1 drop into the left eye daily. , Disp: , Rfl:  .  rOPINIRole (REQUIP) 1 MG tablet, TAKE TWO TABLETS BY MOUTH AT BEDTIME, Disp: 60 tablet, Rfl: 1 .  tiZANidine (ZANAFLEX) 4 MG capsule, Take 4 mg daily as needed by mouth (leg cramps). , Disp: , Rfl:  .  traMADol (ULTRAM) 50 MG tablet, Take 50 mg by mouth 2 (two) times daily. , Disp: , Rfl:  .  vitamin B-12 (CYANOCOBALAMIN) 1000 MCG tablet, Take 1,000 mcg 3 (three) times a week by mouth. , Disp: , Rfl:    Allergies:  Penicillins  Review of Systems: Gen:  Denies  fever, sweats,  chills HEENT: Denies blurred vision, double vision. bleeds, sore throat Cvc:  No dizziness, chest pain. Resp:   Denies cough or sputum production, shortness of breath Gi: Denies swallowing difficulty, stomach pain. Gu:  Denies bladder incontinence, burning urine Ext:   No Joint pain, stiffness. Skin: No skin rash,  hives  Endoc:  No polyuria, polydipsia. Psych: No depression, insomnia. Other:  All other systems were reviewed with the patient and were negative other that what is mentioned in the HPI.   Physical Examination:   VS: BP 118/72 (BP Location: Left Arm, Cuff Size: Normal)   Pulse (!) 58   Resp 16   Ht 5' 1.5" (1.562 m)   Wt 129 lb (58.5 kg)   SpO2 96%   BMI 23.98 kg/m   General Appearance: No distress  Neuro:without focal findings,  speech normal,  HEENT: PERRLA, EOM intact.   Pulmonary: normal breath sounds, No wheezing.  CardiovascularNormal S1,S2.  No m/r/g.   Abdomen: Benign, Soft, non-tender. Renal:  No costovertebral tenderness  GU:  No performed at this time. Endoc: No evident thyromegaly, no signs of acromegaly. Skin:   warm, no rashes, no ecchymosis  Extremities: normal, no cyanosis, clubbing.  Other findings:    LABORATORY PANEL:   CBC No results for input(s): WBC, HGB, HCT, PLT in the last 168 hours. ------------------------------------------------------------------------------------------------------------------  Chemistries  No results for input(s): NA, K, CL, CO2, GLUCOSE, BUN, CREATININE, CALCIUM, MG, AST, ALT, ALKPHOS, BILITOT in the last 168 hours.  Invalid input(s): GFRCGP ------------------------------------------------------------------------------------------------------------------  Cardiac Enzymes No results for input(s): TROPONINI in the last 168 hours. ------------------------------------------------------------  RADIOLOGY:  No results found.     Thank  you for the consultation and for allowing [facility] to assist in the care of your patient. Our recommendations are noted above.  Please contact us if we can be of further service.   Marda Stalker, MD.  Board Certified in Internal Medicine, Pulmonary Medicine, [place], and Sleep Medicine.  [facility] Office Number: [phone]  Patricia Pesa, M.D.  Merton Border, M.D  07/16/2017

## 2017-07-17 ENCOUNTER — Other Ambulatory Visit: Payer: Self-pay | Admitting: Family Medicine

## 2017-08-02 ENCOUNTER — Other Ambulatory Visit: Payer: Self-pay | Admitting: Family Medicine

## 2017-08-15 DIAGNOSIS — M5136 Other intervertebral disc degeneration, lumbar region: Secondary | ICD-10-CM | POA: Diagnosis not present

## 2017-08-15 DIAGNOSIS — M62830 Muscle spasm of back: Secondary | ICD-10-CM | POA: Diagnosis not present

## 2017-08-15 DIAGNOSIS — M7062 Trochanteric bursitis, left hip: Secondary | ICD-10-CM | POA: Diagnosis not present

## 2017-08-15 DIAGNOSIS — M5416 Radiculopathy, lumbar region: Secondary | ICD-10-CM | POA: Diagnosis not present

## 2017-08-15 DIAGNOSIS — M48062 Spinal stenosis, lumbar region with neurogenic claudication: Secondary | ICD-10-CM | POA: Diagnosis not present

## 2017-08-19 ENCOUNTER — Encounter: Payer: Self-pay | Admitting: Cardiovascular Disease

## 2017-08-19 ENCOUNTER — Ambulatory Visit: Payer: Medicare Other | Admitting: Cardiovascular Disease

## 2017-08-19 VITALS — BP 128/88 | HR 53 | Ht 61.5 in | Wt 131.5 lb

## 2017-08-19 DIAGNOSIS — I10 Essential (primary) hypertension: Secondary | ICD-10-CM

## 2017-08-19 DIAGNOSIS — I4819 Other persistent atrial fibrillation: Secondary | ICD-10-CM

## 2017-08-19 DIAGNOSIS — E785 Hyperlipidemia, unspecified: Secondary | ICD-10-CM

## 2017-08-19 DIAGNOSIS — I481 Persistent atrial fibrillation: Secondary | ICD-10-CM | POA: Diagnosis not present

## 2017-08-19 MED ORDER — APIXABAN 2.5 MG PO TABS: 2.5000 mg | ORAL_TABLET | Freq: Two times a day (BID) | ORAL | 1 refills | Status: DC

## 2017-08-19 NOTE — Progress Notes (Signed)
Cardiology Office Note   Date:  08/19/2017   ID:  [name], DOB Apr 01, 1930, MRN [id number]  PCP:  Ria Bush, MD  Cardiologist:   [name], MD   No chief complaint on file.     History of Present Illness: [name] is a 84 y.o. female who presents for a follow-up visit regarding persistent atrial fibrillation maintained in sinus rhythm with amiodarone. Nuclear stress test in 03/2012 showed no evidence of ischemia. Echocardiogram in 2013 showed normal LV systolic function, mild to moderate aortic and mitral regurgitation, moderate tricuspid regurgitation with mild pulmonary hypertension.   Most recent cardioversion was in December 2018 after loading with amiodarone.  No recurrent atrial arrhythmia since then.   During last office visit, I decreased metoprolol due to symptomatic bradycardia.  She reports some improvement in symptoms but continues to complain of dizziness.  She has not had any palpitations or tachycardia.  No chest pain or shortness of breath.  She does complain of easy bruising.   Past Medical History:  Diagnosis Date  . Abdominal aortic atherosclerosis ([place]) 09/2015   by xray  . Anxiety   . BCC (basal cell carcinoma of skin) 05/2011   on nose  . Breast lesion    a. diagnosed with complex sclerosing lesion with calcifications of the right breast in 08/2016  . Chest pain    a. nuclear stress test 03/2012: normal  . DDD (degenerative disc disease), lumbosacral 12/06/1992  . Gallstones 09/2015   incidentally by xray  . GERD (gastroesophageal reflux disease) 05/1999  . History of shingles    ophthalmic, takes acyclovir daily  . Hyperlipidemia 12/2003  . Hypertension 1990  . Mitral regurgitation    a. echo 03/2012: EF 60-65%, no RWMA, mild to mod AI/MR, mod TR, PASP 37 mmHg  . Osteoarthritis 08/21/1989  . Osteoporosis with fracture 11/1997   with compression fracture T12  . Persistent atrial fibrillation ([place]) 03/16/2012   a.  CHADS2VASc => 5 (HTN, age x 2, vascular disease, sex category)-->Eliquis 5 BID;  b. Recurrent AF in 06/2016;  c. 01/2017 s/p DCCV;  d. 02/2017 recurrent AFib->Amio added->DCCV; e. 03/2017 recurrent AFib, s/p reload of amio and DCCV 04/02/2017  . Rheumatoid arthritis ([place])   . RLS (restless legs syndrome)     Past Surgical History:  Procedure Laterality Date  . ABDOMINAL HYSTERECTOMY  Age 51 - 68   S/P TAH  . abdominal ultrasound  04/23/2004   Gallstones  . BREAST LUMPECTOMY WITH RADIOACTIVE SEED LOCALIZATION Right 10/2016   breast lumpectomy with radioactive seed localization and margin assessment for complex sclerosing lesion [facility])  . BREAST LUMPECTOMY WITH RADIOACTIVE SEED LOCALIZATION Right 11/05/2016   Procedure: RIGHT BREAST LUMPECTOMY WITH RADIOACTIVE SEED LOCALIZATION;  Surgeon: Fanny Skates, MD;  Location: [facility];  Service: General;  Laterality: Right;  . CARDIOVERSION N/A 01/31/2017   Procedure: CARDIOVERSION;  Surgeon: Wellington Hampshire, MD;  Location: [facility];  Service: Cardiovascular;  Laterality: N/A;  . CARDIOVERSION N/A 03/03/2017   Procedure: CARDIOVERSION;  Surgeon: Wellington Hampshire, MD;  Location: [facility];  Service: Cardiovascular;  Laterality: N/A;  . CARDIOVERSION N/A 04/02/2017   Procedure: CARDIOVERSION;  Surgeon: Wellington Hampshire, MD;  Location: [facility];  Service: Cardiovascular;  Laterality: N/A;  . CATARACT EXTRACTION  05/2010   left eye  . CERVICAL DISCECTOMY  1992   Fusion (Dr. Joya Salm)  . ESI Bilateral 01/2016   S1 transforaminal ESI x3  . ESOPHAGOGASTRODUODENOSCOPY  01/01/2002  with ulcer, bx. neg; + stricture gastric ulcer with hemorrhage  . ESOPHAGOGASTRODUODENOSCOPY  04/23/2004   Esophageal stricture -- dilated  . nuclear stress test  03/2012   no ischemia  . SKIN CANCER EXCISION  05/20/2011   BCC from nose  . US ECHOCARDIOGRAPHY  03/2012   in flutter, EF 60%, mild-mod aortic, mitral, tricuspid regurg, mildly dilated LA       Current Outpatient Medications  Medication Sig Dispense Refill  . acetaminophen (TYLENOL) 500 MG tablet Take 1,000 mg every 8 (eight) hours as needed by mouth for moderate pain.     Marland Kitchen acyclovir (ZOVIRAX) 400 MG tablet Take 400 mg 2 (two) times daily by mouth.     Marland Kitchen amiodarone (PACERONE) 200 MG tablet Take 1 tablet (200 mg total) by mouth daily. 90 tablet 3  . atorvastatin (LIPITOR) 20 MG tablet TAKE ONE TABLET EVERY DAY 90 tablet 1  . calcium-vitamin D (OSCAL WITH D) 500-200 MG-UNIT per tablet Take 1 tablet by mouth daily.      . Cholecalciferol (VITAMIN D3) 50000 units TABS Take 1 tablet by mouth once a week. 12 tablet 1  . DULoxetine (CYMBALTA) 20 MG capsule Take 1 capsule (20 mg total) by mouth 2 (two) times daily. 60 capsule 3  . ELIQUIS 5 MG TABS tablet TAKE ONE TABLET BY MOUTH TWICE DAILY 60 tablet 6  . gabapentin (NEURONTIN) 100 MG capsule TAKE 2 CAPSULES BY MOUTH TWICE DAILY 120 capsule 3  . metoprolol succinate (TOPROL-XL) 25 MG 24 hr tablet Take 1 tablet (25 mg total) by mouth daily. Take with or immediately following a meal. 90 tablet 3  . pantoprazole (PROTONIX) 40 MG tablet TAKE ONE TABLET EVERY DAY 30 tablet 0  . prednisoLONE acetate (PRED FORTE) 1 % ophthalmic suspension Place 1 drop into the left eye daily.     Marland Kitchen rOPINIRole (REQUIP) 1 MG tablet TAKE TWO TABLETS BY MOUTH AT BEDTIME 60 tablet 1  . tiZANidine (ZANAFLEX) 4 MG capsule Take 4 mg daily as needed by mouth (leg cramps).     . traMADol (ULTRAM) 50 MG tablet Take by mouth 3 (three) times daily.    . vitamin B-12 (CYANOCOBALAMIN) 1000 MCG tablet Take 1,000 mcg by mouth daily.     No current facility-administered medications for this visit.     Allergies:   Penicillins    Social History:  The patient  reports that she quit smoking about 41 years ago. She has a 0.25 pack-year smoking history. She has never used smokeless tobacco. She reports that she does not drink alcohol or use drugs.   Family History:  The  patient's family history includes Cancer in her brother; Diabetes in her brother, brother, brother, and sister; Emphysema in her mother; Heart disease in her brother and father; Hypertension in her brother; Stroke in her brother, father, and sister.    ROS:  Please see the history of present illness.   Otherwise, review of systems are positive for none.   All other systems are reviewed and negative.    PHYSICAL EXAM: VS:  BP 128/88   Pulse (!) 53   Ht 5' 1.5" (1.562 m)   Wt 131 lb 8 oz (59.6 kg)   SpO2 97%   BMI 24.44 kg/m  , BMI Body mass index is 24.44 kg/m. GEN: Well nourished, well developed, in no acute distress  HEENT: normal  Neck: no JVD, carotid bruits, or masses Cardiac: Regular rate and rhythm, mild bradycardia.; no murmurs, rubs, or gallops,no  edema  Respiratory:  clear to auscultation bilaterally, normal work of breathing GI: soft, nontender, nondistended, + BS MS: no deformity or atrophy  Skin: warm and dry, no rash Neuro:  Strength and sensation are intact Psych: euthymic mood, full affect  EKG:  EKG is ordered today. The ekg ordered today demonstrates sinus bradycardia with sinus arrhythmia.  Mildly prolonged QT interval.   Recent Labs: 03/25/2017: ALT 15; BUN 11; Creatinine, Ser 0.72; Hemoglobin 13.3; Magnesium 2.0; Platelets 268; Potassium 3.9; Sodium 142; TSH 4.200    Lipid Panel    Component Value Date/Time   CHOL 195 05/28/2016 1253   TRIG 107.0 05/28/2016 1253   HDL 53.70 05/28/2016 1253   CHOLHDL 4 05/28/2016 1253   VLDL 21.4 05/28/2016 1253   LDLCALC 120 (H) 05/28/2016 1253   LDLDIRECT 173.1 02/19/2010 0908      Wt Readings from Last 3 Encounters:  08/19/17 131 lb 8 oz (59.6 kg)  07/16/17 129 lb (58.5 kg)  07/01/17 127 lb 9.6 oz (57.9 kg)       No flowsheet data found.    ASSESSMENT AND PLAN:  1. Persistental atrial fibrillation:   She is maintaining in sinus rhythm with amiodarone.   She continues to have dizziness likely related  to bradycardia.  I elected to discontinue metoprolol.  Continue treatment with amiodarone. Given age above 65 and weight below 60 kg, I decreased Eliquis 2.5 mg twice daily.   2. Essential hypertension: Blood pressure is reasonably controlled.  3. Hyperlipidemia: Currently on atorvastatin 20 mg once daily with most recent LDL of 75.    Disposition:   FU with me in 6 months  Signed,  [name], MD  08/19/2017 3:07 PM    [facility]

## 2017-08-19 NOTE — Patient Instructions (Addendum)
Medication Instructions: STOP the Metoprolol (Toprol) CHANGE the Eliquis to 2.5 mg twice daily.   If you need a refill on your cardiac medications before your next appointment, please call your pharmacy.   Follow-Up: Your physician wants you to follow-up in 6 months with Dr. Fletcher Anon. You will receive a reminder letter in the mail two months in advance. If you don't receive a letter, please call our office at [phone] to schedule this follow-up appointment.  Thank you for choosing [facility]care at [facility]!

## 2017-09-22 ENCOUNTER — Other Ambulatory Visit: Payer: Self-pay | Admitting: Family Medicine

## 2017-09-23 ENCOUNTER — Other Ambulatory Visit: Payer: Self-pay | Admitting: Family Medicine

## 2017-09-23 NOTE — Telephone Encounter (Signed)
Electronic refill request Last office visit 04/07/17 Last refill 07/14/17 #60/1

## 2017-09-24 ENCOUNTER — Ambulatory Visit: Payer: Self-pay

## 2017-09-24 NOTE — Telephone Encounter (Signed)
Phone call rec'd from pt.  C/o constipation over past 3 weeks.  Stated she has tried Fleets enema and Miralax.  Stated she has very small stools about every other.  Denied nausea, vomiting, abdominal pain, or abdominal bloating.  Stated she has tried digital stimulation and only able to expel small, firm stool.  Denied any rectal pain or signs of bleeding with stools.  Denied any narcotic use, or recent change in medication.  Pt. Stated she has lost weight; estimated 10-15 # over 1-2 years.  Discussed pt's current dietary routine.  Care advice given per protocol.  Appt. Offered this week, but declined due to family plans.  Appt. given 6/24 @ 3:30 PM.  Pt. Verb. Understanding of instructions; agrees with plan.    Reason for Disposition . [1] Constipation persists > 1 week AND [2] following Constipation Care Advice  Answer Assessment - Initial Assessment Questions 1. STOOL PATTERN OR FREQUENCY: "How often do you pass bowel movements (BMs)?"  (Normal range: tid to q 3 days)  "When was the last BM passed?"       Usually every other day; had small BM yesterday 2. STRAINING: "Do you have to strain to have a BM?"      Some straining 3. RECTAL PAIN: "Does your rectum hurt when the stool comes out?" If so, ask: "Do you have hemorrhoids? How bad is the pain?"  (Scale 1-10; or mild, moderate, severe)     Denied rectal pain  4. STOOL COMPOSITION: "Are the stools hard?"      Hard stool consistency 5. BLOOD ON STOOLS: "Has there been any blood on the toilet tissue or on the surface of the BM?" If so, ask: "When was the last time?"      Denied blood in stools 6. CHRONIC CONSTIPATION: "Is this a new problem for you?"  If no, ask: How long have you had this problem?" (days, weeks, months)      3 weeks  7. CHANGES IN DIET: "Have there been any recent changes in your diet?"      Eating a lot of frozen meals; eats a lot of fruit; drinks juices  8. MEDICATIONS: "Have you been taking any new medications?"     No  9.  LAXATIVES: "Have you been using any laxatives or enemas?"  If yes, ask "What, how often, and when was the last time?"     Miralax, Fleets enema's 10. CAUSE: "What do you think is causing the constipation?"        unknown 11. OTHER SYMPTOMS: "Do you have any other symptoms?" (e.g., abdominal pain, fever, vomiting)       A lot less active due to back discomfort; denied abd. pain , nausea or vomiting.  Protocols used: CONSTIPATION-A-AH

## 2017-09-29 ENCOUNTER — Ambulatory Visit: Payer: Medicare Other | Admitting: Family Medicine

## 2017-09-29 DIAGNOSIS — B0233 Zoster keratitis: Secondary | ICD-10-CM | POA: Diagnosis not present

## 2017-10-01 ENCOUNTER — Other Ambulatory Visit: Payer: Self-pay | Admitting: Family Medicine

## 2017-10-01 ENCOUNTER — Ambulatory Visit: Payer: Medicare Other | Admitting: Family Medicine

## 2017-10-01 ENCOUNTER — Encounter: Payer: Self-pay | Admitting: Family Medicine

## 2017-10-01 VITALS — BP 122/80 | HR 64 | Temp 97.8°F | Ht 61.5 in | Wt 130.5 lb

## 2017-10-01 DIAGNOSIS — M150 Primary generalized (osteo)arthritis: Secondary | ICD-10-CM | POA: Diagnosis not present

## 2017-10-01 DIAGNOSIS — G8929 Other chronic pain: Secondary | ICD-10-CM | POA: Diagnosis not present

## 2017-10-01 DIAGNOSIS — F331 Major depressive disorder, recurrent, moderate: Secondary | ICD-10-CM | POA: Diagnosis not present

## 2017-10-01 DIAGNOSIS — K5903 Drug induced constipation: Secondary | ICD-10-CM

## 2017-10-01 DIAGNOSIS — R42 Dizziness and giddiness: Secondary | ICD-10-CM | POA: Diagnosis not present

## 2017-10-01 DIAGNOSIS — M159 Polyosteoarthritis, unspecified: Secondary | ICD-10-CM

## 2017-10-01 DIAGNOSIS — M5416 Radiculopathy, lumbar region: Secondary | ICD-10-CM | POA: Diagnosis not present

## 2017-10-01 DIAGNOSIS — M5136 Other intervertebral disc degeneration, lumbar region: Secondary | ICD-10-CM

## 2017-10-01 DIAGNOSIS — K5900 Constipation, unspecified: Secondary | ICD-10-CM | POA: Insufficient documentation

## 2017-10-01 DIAGNOSIS — K5909 Other constipation: Secondary | ICD-10-CM | POA: Insufficient documentation

## 2017-10-01 DIAGNOSIS — R2681 Unsteadiness on feet: Secondary | ICD-10-CM | POA: Insufficient documentation

## 2017-10-01 MED ORDER — DULOXETINE HCL 60 MG PO CPEP: 60.0000 mg | ORAL_CAPSULE | Freq: Every day | ORAL | 3 refills | Status: DC

## 2017-10-01 MED ORDER — GABAPENTIN 100 MG PO CAPS: ORAL_CAPSULE | ORAL | 3 refills | Status: DC

## 2017-10-01 NOTE — Assessment & Plan Note (Signed)
Dr Sharlet Salina continues to prescribe tramadol - taking 100mg  at night.

## 2017-10-01 NOTE — Assessment & Plan Note (Signed)
Deteriorated due to limitations from chronic back pain - will trial cymbalta 60mg  daily.

## 2017-10-01 NOTE — Assessment & Plan Note (Signed)
Noted on exam today - anticipate related to polypharmacy and overmedication. rec limiting sedating meds (gabapentin, tramadol, tizanidine). Reassess at 1 mo f/u, I asked her to come with daughter.

## 2017-10-01 NOTE — Assessment & Plan Note (Addendum)
Chronic, multifactorial, end stage osteoarthritis.  Seems to tolerate tylenol best - rec schedule tylenol 1000mg  TID, limit other sedating medications (gabapentin, tramadol, tizanidine) RTC 1 mo f/u visit

## 2017-10-01 NOTE — Patient Instructions (Addendum)
If you regularly take tramadol, you need to regularly take a bowel regimen - senna and miralax are fine to take daily.  Schedule tylenol 2 tablets with breakfast lunch and dinner.  Try to limit other pain medicines - tramadol, tizanidine, and gabapentin as all these can make you unsteady.  Decrease gabapentin to 1 in the morning and 2 at night. Let's increase cymbalta to 60mg  once daily - new dose will be sent to pharmacy. This is a mood medicine that hopefully will help some with chronic back pain. CAUTION with worsening dizziness with this increase, let me know.  Return in 1 month for follow up visit.

## 2017-10-01 NOTE — Progress Notes (Signed)
BP 122/80 (BP Location: Left Arm, Patient Position: Sitting, Cuff Size: Normal)   Pulse 64   Temp 97.8 F (36.6 C) (Oral)   Ht 5' 1.5" (1.562 m)   Wt 130 lb 8 oz (59.2 kg)   SpO2 98%   BMI 24.26 kg/m    CC: constipation, worsening back pain Subjective:    Patient ID: [name], female    DOB: 10-17-1929, 84 y.o.   MRN: [id number]  HPI: [name] is a 84 y.o. female presenting on 10/01/2017 for Constipation (C/o increased constipation for the past month. Has taken Senokot.)   Arrived late today to appointment.  Daughter to retire this Friday.   Constipation has cleared up - see recent phone note. Treated by nurse triage with miralax and sennakot.  Saw cardiology last month - metoprolol was discontinued due to bradycardia related dizziness. Persistent afib - continues amiodarone. Eliquis was decreased to 2.5mg  bid.   Saw GI - esophagus studies ok - no achalasia - planned monitoring, rec against EGD at this time. ?weight loss related to depression.   Saw PM&R - multifactorial acute on chronic lower back pain thought related to spinal stenosis, disc herniation, lumbar radiculitis, lumbar muscle spasm. ESI not very effective. Activity limiting back pain. No longer able to work in her garden which she really enjoyed doing. Daughter has taken over this. No longer able to go grocery shopping due to pain.   She regularly takes tylenol 1000mg  at lunch and second dosing if needed, tramadol 2 tablets night as needed, gabapentin 200mg  twice daily, tizanidine 2mg  twice daily as needed. Stronger narcotics have caused nausea (Nucynta). She also takes cymbalta 20mg  twice daily.   Relevant past medical, surgical, family and social history reviewed and updated as indicated. Interim medical history since our last visit reviewed. Allergies and medications reviewed and updated. Outpatient Medications Prior to Visit  Medication Sig Dispense Refill  . acetaminophen (TYLENOL) 500 MG tablet  Take 1,000 mg every 8 (eight) hours as needed by mouth for moderate pain.     Marland Kitchen acyclovir (ZOVIRAX) 400 MG tablet Take 400 mg 2 (two) times daily by mouth.     Marland Kitchen amiodarone (PACERONE) 200 MG tablet Take 1 tablet (200 mg total) by mouth daily. 90 tablet 3  . apixaban (ELIQUIS) 2.5 MG TABS tablet Take 1 tablet (2.5 mg total) by mouth 2 (two) times daily. 180 tablet 1  . atorvastatin (LIPITOR) 20 MG tablet TAKE ONE TABLET EVERY DAY 90 tablet 1  . calcium-vitamin D (OSCAL WITH D) 500-200 MG-UNIT per tablet Take 1 tablet by mouth daily.      . Cholecalciferol (VITAMIN D3) 50000 units TABS Take 1 tablet by mouth once a week. 12 tablet 1  . pantoprazole (PROTONIX) 40 MG tablet TAKE ONE TABLET EVERY DAY 30 tablet 0  . prednisoLONE acetate (PRED FORTE) 1 % ophthalmic suspension Place 1 drop into the left eye daily.     Marland Kitchen rOPINIRole (REQUIP) 1 MG tablet TAKE TWO TABLETS EACH NIGHT AT BEDTIME 60 tablet 3  . tiZANidine (ZANAFLEX) 2 MG tablet Take 1 tablet by mouth daily.    . traMADol (ULTRAM) 50 MG tablet Take by mouth 3 (three) times daily.    . vitamin B-12 (CYANOCOBALAMIN) 1000 MCG tablet Take 1,000 mcg by mouth daily.    . DULoxetine (CYMBALTA) 20 MG capsule TAKE 1 CAPSULE BY MOUTH TWICE DAILY 60 capsule 1  . gabapentin (NEURONTIN) 100 MG capsule TAKE 2 CAPSULES BY MOUTH TWICE DAILY  120 capsule 3  . tiZANidine (ZANAFLEX) 4 MG capsule Take 4 mg daily as needed by mouth (leg cramps).      No facility-administered medications prior to visit.      Per HPI unless specifically indicated in ROS section below Review of Systems     Objective:    BP 122/80 (BP Location: Left Arm, Patient Position: Sitting, Cuff Size: Normal)   Pulse 64   Temp 97.8 F (36.6 C) (Oral)   Ht 5' 1.5" (1.562 m)   Wt 130 lb 8 oz (59.2 kg)   SpO2 98%   BMI 24.26 kg/m   Wt Readings from Last 3 Encounters:  10/01/17 130 lb 8 oz (59.2 kg)  08/19/17 131 lb 8 oz (59.6 kg)  07/16/17 129 lb (58.5 kg)    Physical Exam    Constitutional: She appears well-developed and well-nourished. No distress.  Uncomfortable, in pain from lower back Walks with cane Very dizzy and unsteady on her feet initially - this improves after a few minutes of standing  Cardiovascular: Normal rate and regular rhythm.  Murmur (3/6 systolic) heard. Sounds regular today  Pulmonary/Chest: Effort normal and breath sounds normal. No respiratory distress. She has no wheezes. She has no rales.  Nursing note and vitals reviewed.  Results for orders placed or performed in visit on 05/19/17  Calcium  Result Value Ref Range   Calcium 8.8 8.4 - 10.5 mg/dL  VITAMIN D 25 Hydroxy (Vit-D Deficiency, Fractures)  Result Value Ref Range   VITD 19.10 (L) 30.00 - 100.00 ng/mL   Lab Results  Component Value Date   ALT 15 03/25/2017   AST 17 03/25/2017   ALKPHOS 91 03/25/2017   BILITOT 0.5 03/25/2017      Assessment & Plan:   Problem List Items Addressed This Visit    Osteoarthritis   Relevant Medications   tiZANidine (ZANAFLEX) 2 MG tablet   MDD (major depressive disorder), recurrent episode ([place])    Deteriorated due to limitations from chronic back pain - will trial cymbalta 60mg  daily.       Relevant Medications   DULoxetine (CYMBALTA) 60 MG capsule   Encounter for chronic pain management    Dr Sharlet Salina continues to prescribe tramadol - taking 100mg  at night.      Dizziness    Noted on exam today - anticipate related to polypharmacy and overmedication. rec limiting sedating meds (gabapentin, tramadol, tizanidine). Reassess at 1 mo f/u, I asked her to come with daughter.       DDD (degenerative disc disease), lumbar   Relevant Medications   tiZANidine (ZANAFLEX) 2 MG tablet   Constipation    Anticipate tramadol related - discussed need for bowel regimen if she continues tramadol - take miralax and sennaokt daily.       Chronic radicular pain of lower back - Primary    Chronic, multifactorial, end stage osteoarthritis.  Seems  to tolerate tylenol best - rec schedule tylenol 1000mg  TID, limit other sedating medications (gabapentin, tramadol, tizanidine) RTC 1 mo f/u visit      Relevant Medications   tiZANidine (ZANAFLEX) 2 MG tablet   DULoxetine (CYMBALTA) 60 MG capsule   gabapentin (NEURONTIN) 100 MG capsule       Meds ordered this encounter  Medications  . DULoxetine (CYMBALTA) 60 MG capsule    Sig: Take 1 capsule (60 mg total) by mouth daily.    Dispense:  30 capsule    Refill:  3    Note new sig  .  gabapentin (NEURONTIN) 100 MG capsule    Sig: Take one tablet in the morning and two at night    Dispense:  90 capsule    Refill:  3    Note new sig.   No orders of the defined types were placed in this encounter.   Follow up plan: Return in about 1 month (around 10/29/2017), or if symptoms worsen or fail to improve, for follow up visit.  Ria Bush, MD

## 2017-10-01 NOTE — Assessment & Plan Note (Signed)
Anticipate tramadol related - discussed need for bowel regimen if she continues tramadol - take miralax and sennaokt daily.

## 2017-10-14 ENCOUNTER — Telehealth: Payer: Self-pay | Admitting: *Deleted

## 2017-10-14 NOTE — Telephone Encounter (Signed)
Information has been submitted to pts insurance for verification of benefits. Awaiting response for coverage  

## 2017-10-20 ENCOUNTER — Other Ambulatory Visit: Payer: Self-pay | Admitting: Family Medicine

## 2017-11-05 ENCOUNTER — Ambulatory Visit: Payer: Medicare Other | Admitting: Family Medicine

## 2017-11-17 NOTE — Telephone Encounter (Signed)
Verification of benefits have been processed and an approval has been received for pts prolia injection. Pts estimated cost are appx $260. This is only an estimate and cannot be confirmed until benefits are paid. Please advise pt and schedule if needed. If scheduled, once the injection is received, pls contact me back with the date it was received so that I am able to update prolia folder. thanks  

## 2017-11-17 NOTE — Telephone Encounter (Signed)
Spoke to pt and advised. Pt is agreeable to charge but states she prefers to wait until Sept to proceed with injection as she states she "has a lot going on right now."

## 2017-11-19 ENCOUNTER — Other Ambulatory Visit: Payer: Self-pay | Admitting: Family Medicine

## 2017-11-19 NOTE — Telephone Encounter (Signed)
Gabapentin Last filled:  10/20/17, #90 Last OV: 10/01/17, f/u Next OV:  none

## 2017-11-21 ENCOUNTER — Other Ambulatory Visit: Payer: Self-pay

## 2017-11-21 DIAGNOSIS — M48062 Spinal stenosis, lumbar region with neurogenic claudication: Secondary | ICD-10-CM | POA: Diagnosis not present

## 2017-11-21 DIAGNOSIS — M5136 Other intervertebral disc degeneration, lumbar region: Secondary | ICD-10-CM | POA: Diagnosis not present

## 2017-11-21 DIAGNOSIS — M62830 Muscle spasm of back: Secondary | ICD-10-CM | POA: Diagnosis not present

## 2017-11-21 DIAGNOSIS — M5416 Radiculopathy, lumbar region: Secondary | ICD-10-CM | POA: Diagnosis not present

## 2017-11-21 NOTE — Telephone Encounter (Signed)
Left message on vm per dpr informing pt we received a refill request for furosemide 20 mg and that this med is not on pt's current med list.  Asked pt to call back to let us know if she is taking this med and needs a refill.  Also, I called [facility] to see if they have ever filled this med for pt.  States they do not show they have ever filled this med for pt.

## 2017-11-21 NOTE — Telephone Encounter (Signed)
Pt states she has never taken that medication before but that she does need something to help take the fluid off her legs, at least to get through weekend. CB#: [phone]

## 2017-11-21 NOTE — Telephone Encounter (Signed)
Please schedule office visit for eval

## 2017-11-21 NOTE — Telephone Encounter (Signed)
Noted  

## 2017-11-24 NOTE — Telephone Encounter (Signed)
Spoke to patient and appointment scheduled for Thursday 11/27/17 at 11:15.

## 2017-11-27 ENCOUNTER — Ambulatory Visit: Payer: Medicare Other | Admitting: Family Medicine

## 2017-11-28 ENCOUNTER — Ambulatory Visit: Payer: Medicare Other | Admitting: Family Medicine

## 2017-12-01 ENCOUNTER — Other Ambulatory Visit: Payer: Self-pay | Admitting: Family Medicine

## 2017-12-01 DIAGNOSIS — M8000XD Age-related osteoporosis with current pathological fracture, unspecified site, subsequent encounter for fracture with routine healing: Secondary | ICD-10-CM

## 2017-12-03 ENCOUNTER — Ambulatory Visit: Payer: Medicare Other | Admitting: Family Medicine

## 2017-12-04 ENCOUNTER — Ambulatory Visit (INDEPENDENT_AMBULATORY_CARE_PROVIDER_SITE_OTHER): Payer: Medicare Other | Admitting: Family Medicine

## 2017-12-04 ENCOUNTER — Encounter: Payer: Self-pay | Admitting: Family Medicine

## 2017-12-04 VITALS — BP 140/84 | HR 66 | Temp 98.2°F | Ht 61.5 in | Wt 128.8 lb

## 2017-12-04 DIAGNOSIS — G8929 Other chronic pain: Secondary | ICD-10-CM | POA: Diagnosis not present

## 2017-12-04 DIAGNOSIS — S00531A Contusion of lip, initial encounter: Secondary | ICD-10-CM

## 2017-12-04 DIAGNOSIS — R42 Dizziness and giddiness: Secondary | ICD-10-CM | POA: Diagnosis not present

## 2017-12-04 DIAGNOSIS — R2681 Unsteadiness on feet: Secondary | ICD-10-CM

## 2017-12-04 DIAGNOSIS — W19XXXA Unspecified fall, initial encounter: Secondary | ICD-10-CM

## 2017-12-04 DIAGNOSIS — M150 Primary generalized (osteo)arthritis: Secondary | ICD-10-CM | POA: Diagnosis not present

## 2017-12-04 DIAGNOSIS — G6289 Other specified polyneuropathies: Secondary | ICD-10-CM

## 2017-12-04 DIAGNOSIS — M5416 Radiculopathy, lumbar region: Secondary | ICD-10-CM | POA: Diagnosis not present

## 2017-12-04 DIAGNOSIS — G629 Polyneuropathy, unspecified: Secondary | ICD-10-CM | POA: Insufficient documentation

## 2017-12-04 DIAGNOSIS — M159 Polyosteoarthritis, unspecified: Secondary | ICD-10-CM

## 2017-12-04 NOTE — Assessment & Plan Note (Addendum)
Persists. Will continue working towards minimizing sedating medications. Will refer for balance training program. Encouraged ongoing walker and/or cane use.  Shower seat Rx provided today.

## 2017-12-04 NOTE — Assessment & Plan Note (Addendum)
Denies alcohol, B12 levels normal. May be stemming from lumbar issues. This contributes to unsteadiness

## 2017-12-04 NOTE — Assessment & Plan Note (Signed)
2 in last 2 weeks. Discussed use of ambulatory assistive device (walker and cane).  Will refer to PT for fall prevention program.

## 2017-12-04 NOTE — Progress Notes (Signed)
BP 140/84 (BP Location: Left Arm, Patient Position: Sitting, Cuff Size: Normal)   Pulse 66   Temp 98.2 F (36.8 C) (Oral)   Ht 5' 1.5" (1.562 m)   Wt 128 lb 12 oz (58.4 kg)   SpO2 97%   BMI 23.93 kg/m    CC: fall, leg swelling Subjective:    Patient ID: [name], female    DOB: [birth year], 84 y.o.   MRN: [id number]  HPI: [name] is a 84 y.o. female presenting on 12/04/2017 for Joint Swelling (C/o bilateral ankle and feet swelling that started last week. Pt has recently moved and was very active. Says swelling has gone down.  Pt is accompanied by her daughter, [name]. ) and Fall (States she has fallen 2x in last 2 wks. Concerned about losing balance. )   Here with daughter [name] who is pt's primary caregiver.   Noted ankle/toe swelling last week.   Has had 2 falls in the last 2 week - both falls were dog related. Bruised L lower lip.  Just moved to new apartment. She is more regularly using cane. Daughter is encouraging her to use walker. Denies dizziness, lightheadedness. Denies numbness. Endorses tingling and burning of feet.   Sees PMR ([facility]) for acute on chronic lower back pain with spinal stenosis, disc herniation, lumbar radiculitis and muscle spasm. ESI have not been effective. Activity limiting back pain. Most recently received 2 trigger point steroid injections into hips.   She has not had recent physical therapy.   Current regimen is tylenol 1000mg  tid, tramadol 1 tab TID, gabapentin 100/200mg , tizanidine 2mg  BID. Also on cymbalta 60mg  daily.  Relevant past medical, surgical, family and social history reviewed and updated as indicated. Interim medical history since our last visit reviewed. Allergies and medications reviewed and updated. Outpatient Medications Prior to Visit  Medication Sig Dispense Refill  . acetaminophen (TYLENOL) 500 MG tablet Take 1,000 mg every 8 (eight) hours as needed by mouth for moderate pain.     Marland Kitchen acyclovir (ZOVIRAX) 400  MG tablet Take 400 mg 2 (two) times daily by mouth.     Marland Kitchen amiodarone (PACERONE) 200 MG tablet Take 1 tablet (200 mg total) by mouth daily. 90 tablet 3  . apixaban (ELIQUIS) 2.5 MG TABS tablet Take 1 tablet (2.5 mg total) by mouth 2 (two) times daily. 180 tablet 1  . atorvastatin (LIPITOR) 20 MG tablet TAKE ONE TABLET EVERY DAY 90 tablet 1  . calcium-vitamin D (OSCAL WITH D) 500-200 MG-UNIT per tablet Take 1 tablet by mouth daily.      . DULoxetine (CYMBALTA) 60 MG capsule Take 1 capsule (60 mg total) by mouth daily. 30 capsule 3  . gabapentin (NEURONTIN) 100 MG capsule TAKE 1 CAPSULE EVERY MORNING AND TAKE 2 CAPSULES EVERY EVENING 90 capsule 3  . pantoprazole (PROTONIX) 40 MG tablet TAKE ONE TABLET BY MOUTH EVERY DAY 30 tablet 0  . prednisoLONE acetate (PRED FORTE) 1 % ophthalmic suspension Place 1 drop into the left eye daily.     Marland Kitchen rOPINIRole (REQUIP) 1 MG tablet TAKE TWO TABLETS EACH NIGHT AT BEDTIME 60 tablet 0  . tiZANidine (ZANAFLEX) 2 MG tablet Take 1 tablet (2 mg total) by mouth 2 (two) times daily as needed for muscle spasms.    . traMADol (ULTRAM) 50 MG tablet Take 1 tablet (50 mg total) by mouth 2 (two) times daily.  0  . vitamin B-12 (CYANOCOBALAMIN) 1000 MCG tablet Take 1,000 mcg by mouth daily.    Marland Kitchen  Vitamin D, Ergocalciferol, (DRISDOL) 50000 units CAPS capsule TAKE 1 CAPSULE BY MOUTH ONCE WEEKLY 12 capsule 1  . tiZANidine (ZANAFLEX) 2 MG tablet Take 1 tablet by mouth 2 (two) times daily.     . traMADol (ULTRAM) 50 MG tablet Take by mouth 3 (three) times daily.     No facility-administered medications prior to visit.      Per HPI unless specifically indicated in ROS section below Review of Systems     Objective:    BP 140/84 (BP Location: Left Arm, Patient Position: Sitting, Cuff Size: Normal)   Pulse 66   Temp 98.2 F (36.8 C) (Oral)   Ht 5' 1.5" (1.562 m)   Wt 128 lb 12 oz (58.4 kg)   SpO2 97%   BMI 23.93 kg/m   Wt Readings from Last 3 Encounters:  12/04/17 128 lb  12 oz (58.4 kg)  10/01/17 130 lb 8 oz (59.2 kg)  08/19/17 131 lb 8 oz (59.6 kg)    Physical Exam  Constitutional: She appears well-developed and well-nourished. No distress.  Musculoskeletal: Normal range of motion. She exhibits no edema.  Purplish hue to bilateral lower legs No significant leg swelling Diminished DPs bilaterally Sensation to light touch intact Diminished sensation to monofilament testing bilaterally  Neurological: She is alert.  Skin: Skin is warm and dry. No rash noted. There is pallor.  Psychiatric: She has a normal mood and affect.  Nursing note and vitals reviewed.  Results for orders placed or performed in visit on 05/19/17  Calcium  Result Value Ref Range   Calcium 8.8 8.4 - 10.5 mg/dL  VITAMIN D 25 Hydroxy (Vit-D Deficiency, Fractures)  Result Value Ref Range   VITD 19.10 (L) 30.00 - 100.00 ng/mL   Lab Results  Component Value Date   VITAMINB12 677 10/28/2013    Lab Results  Component Value Date   WBC 5.1 03/25/2017   HGB 13.3 03/25/2017   HCT 38.4 03/25/2017   MCV 90 03/25/2017   PLT 268 03/25/2017       Assessment & Plan:  They request shower seat Rx. Problem List Items Addressed This Visit    Peripheral neuropathy    Denies alcohol, B12 levels normal. May be stemming from lumbar issues. This contributes to unsteadiness      Relevant Medications   tiZANidine (ZANAFLEX) 2 MG tablet   Other Relevant Orders   Ambulatory referral to Physical Therapy   Osteoarthritis   Relevant Medications   tiZANidine (ZANAFLEX) 2 MG tablet   traMADol (ULTRAM) 50 MG tablet   General unsteadiness    Persists. Will continue working towards minimizing sedating medications. Will refer for balance training program. Encouraged ongoing walker and/or cane use.  Shower seat Rx provided today.       Relevant Orders   Ambulatory referral to Physical Therapy   Fall - Primary    2 in last 2 weeks. Discussed use of ambulatory assistive device (walker and cane).    Will refer to PT for fall prevention program.      Relevant Orders   Ambulatory referral to Physical Therapy   Encounter for chronic pain management    Reviewed current pain regimen. On tramadol through PMR.  Encouraged minimizing sedating medications that may increase unsteadiness/fall risk including tizanidine and tramadol. Continue scheduled tylenol 1000mg  tid      Chronic radicular pain of lower back    Better pain control with latest med changes.       Relevant Medications   tiZANidine (  ZANAFLEX) 2 MG tablet   traMADol (ULTRAM) 50 MG tablet   Other Relevant Orders   Ambulatory referral to Physical Therapy       No orders of the defined types were placed in this encounter.  Orders Placed This Encounter  Procedures  . Ambulatory referral to Physical Therapy    Referral Priority:   Routine    Referral Type:   Physical Medicine    Referral Reason:   Specialty Services Required    Requested Specialty:   Physical Therapy    Number of Visits Requested:   1    Follow up plan: Return in about 6 weeks (around 01/15/2018) for follow up visit.  Ria Bush, MD

## 2017-12-04 NOTE — Assessment & Plan Note (Signed)
Better pain control with latest med changes.

## 2017-12-04 NOTE — Patient Instructions (Addendum)
We will refer you to physical therapy balance training and fall prevention program.  Try to back off tizanadine and tramadol - max 2 a day (not 3 a day).  Call me in 2-3 weeks with an update on how above changes are doing.  Return in 1-2 months for follow up visit.  Good to see you today.

## 2017-12-04 NOTE — Assessment & Plan Note (Addendum)
Reviewed current pain regimen. On tramadol through PMR.  Encouraged minimizing sedating medications that may increase unsteadiness/fall risk including tizanidine and tramadol. Continue scheduled tylenol 1000mg  tid

## 2017-12-09 ENCOUNTER — Other Ambulatory Visit: Payer: Self-pay

## 2017-12-09 ENCOUNTER — Other Ambulatory Visit: Payer: Medicare Other

## 2017-12-09 ENCOUNTER — Telehealth: Payer: Self-pay

## 2017-12-09 ENCOUNTER — Encounter: Payer: Self-pay | Admitting: Physical Therapy

## 2017-12-09 ENCOUNTER — Ambulatory Visit: Payer: Medicare Other | Attending: Family Medicine | Admitting: Physical Therapy

## 2017-12-09 DIAGNOSIS — R269 Unspecified abnormalities of gait and mobility: Secondary | ICD-10-CM | POA: Insufficient documentation

## 2017-12-09 DIAGNOSIS — R2681 Unsteadiness on feet: Secondary | ICD-10-CM | POA: Diagnosis not present

## 2017-12-09 DIAGNOSIS — M6281 Muscle weakness (generalized): Secondary | ICD-10-CM | POA: Diagnosis not present

## 2017-12-09 NOTE — Telephone Encounter (Signed)
Anderson Malta [facility] said pt has another appt today and wants to know if Calcium lab could be changed to another day. Pt has prolia inj scheduled on 12/17/17; advised could schedule CA lab later in this week. Anderson Malta voiced understanding.

## 2017-12-09 NOTE — Therapy (Addendum)
[place] MAIN [facility] SERVICES [address] [place], [place], [address] Phone: [phone]   Fax:  [phone]  Physical Therapy Evaluation  Patient Details  Name: [name] MRN: [id number] Date of Birth: 06-Mar-1930 Referring Provider: Ria Bush   Encounter Date: 12/09/2017  PT End of Session - 12/09/17 1651    Visit Number  1    Number of Visits  17    Date for PT Re-Evaluation  02/01/16    Authorization Type  1/10    PT Start Time  0435    PT Stop Time  0530    PT Time Calculation (min)  55 min    Equipment Utilized During Treatment  Gait belt    Activity Tolerance  Patient tolerated treatment well;Patient limited by fatigue    Behavior During Therapy  [facility] for tasks assessed/performed       Past Medical History:  Diagnosis Date  . Abdominal aortic atherosclerosis ([place]) 09/2015   by xray  . Anxiety   . BCC (basal cell carcinoma of skin) 05/2011   on nose  . Breast lesion    a. diagnosed with complex sclerosing lesion with calcifications of the right breast in 08/2016  . Chest pain    a. nuclear stress test 03/2012: normal  . DDD (degenerative disc disease), lumbosacral 12/06/1992  . Gallstones 09/2015   incidentally by xray  . GERD (gastroesophageal reflux disease) 05/1999  . HERPES ZOSTER 04/13/2007   Qualifier: Diagnosis of  By: [name], [name]   . History of shingles    ophthalmic, takes acyclovir daily  . Hyperlipidemia 12/2003  . Hypertension 1990  . Mitral regurgitation    a. echo 03/2012: EF 60-65%, no RWMA, mild to mod AI/MR, mod TR, PASP 37 mmHg  . Osteoarthritis 08/21/1989  . Osteoporosis with fracture 11/1997   with compression fracture T12  . Persistent atrial fibrillation ([place]) 03/16/2012   a. CHADS2VASc => 5 (HTN, age x 2, vascular disease, sex category)-->Eliquis 5 BID;  b. Recurrent AF in 06/2016;  c. 01/2017 s/p DCCV;  d. 02/2017 recurrent AFib->Amio added->DCCV; e. 03/2017 recurrent  AFib, s/p reload of amio and DCCV 04/02/2017  . Rheumatoid arthritis ([place])   . RLS (restless legs syndrome)     Past Surgical History:  Procedure Laterality Date  . ABDOMINAL HYSTERECTOMY  Age 26 - 37   S/P TAH  . abdominal ultrasound  04/23/2004   Gallstones  . BREAST LUMPECTOMY WITH RADIOACTIVE SEED LOCALIZATION Right 10/2016   breast lumpectomy with radioactive seed localization and margin assessment for complex sclerosing lesion [facility])  . BREAST LUMPECTOMY WITH RADIOACTIVE SEED LOCALIZATION Right 11/05/2016   Procedure: RIGHT BREAST LUMPECTOMY WITH RADIOACTIVE SEED LOCALIZATION;  Surgeon: Fanny Skates, MD;  Location: [place];  Service: General;  Laterality: Right;  . CARDIOVERSION N/A 01/31/2017   Procedure: CARDIOVERSION;  Surgeon: Wellington Hampshire, MD;  Location: [facility];  Service: Cardiovascular;  Laterality: N/A;  . CARDIOVERSION N/A 03/03/2017   Procedure: CARDIOVERSION;  Surgeon: Wellington Hampshire, MD;  Location: [place] [facility];  Service: Cardiovascular;  Laterality: N/A;  . CARDIOVERSION N/A 04/02/2017   Procedure: CARDIOVERSION;  Surgeon: Wellington Hampshire, MD;  Location: [facility];  Service: Cardiovascular;  Laterality: N/A;  . CATARACT EXTRACTION  05/2010   left eye  . CERVICAL DISCECTOMY  1992   Fusion (Dr. Joya Salm)  . ESI Bilateral 01/2016   S1 transforaminal ESI x3  . ESOPHAGOGASTRODUODENOSCOPY  01/01/2002   with ulcer, bx.  neg; + stricture gastric ulcer with hemorrhage  . ESOPHAGOGASTRODUODENOSCOPY  04/23/2004   Esophageal stricture -- dilated  . nuclear stress test  03/2012   no ischemia  . SKIN CANCER EXCISION  05/20/2011   BCC from nose  . US ECHOCARDIOGRAPHY  03/2012   in flutter, EF 60%, mild-mod aortic, mitral, tricuspid regurg, mildly dilated LA    There were no vitals filed for this visit.   Subjective Assessment - 12/09/17 1642    Subjective  Patient has had 2 falls in the last 2 weeks. Patient lives alone and moved 2 weeks ago  into a senior living.     Patient is accompained by:  Family member    Pertinent History  Patient has been having balance defictis and has had several falls in the past year.     Currently in Pain?  No/denies    Pain Score  0-No pain         [facility] PT Assessment - 12/09/17 1646      Assessment   Medical Diagnosis  unsteadiness. falls    Referring Provider  Ria Bush    Onset Date/Surgical Date  12/04/17    Prior Therapy  no      Precautions   Precautions  None      Restrictions   Weight Bearing Restrictions  No      Balance Screen   Has the patient fallen in the past 6 months  Yes    Has the patient had a decrease in activity level because of a fear of falling?   Yes    Is the patient reluctant to leave their home because of a fear of falling?   No      Home Social worker  Private residence    Living Arrangements  Alone    Available Help at Discharge  Family    Type of Home  Other(Comment)    Home Access  --   retirement home independent living   Holiday  None      Prior Function   Vocation  Retired    Leisure  walks dog        POSTURE: WNL   PROM/AROM: decreased flexibility BLE hamstring and calf musculature  STRENGTH:  Graded on a 0-5 scale Muscle Group Left Right                          Hip Flex 4/5 3/5  Hip Abd 3/5 4/5  Hip Add 2/5 2/5  Hip Ext 1/5 -3/5      Knee Flex 5/5 5/5  Knee Ext 5/5 5/5  Ankle DF    Ankle PF 2/5 2/5   SENSATION: BLE    FUNCTIONAL MOBILITY: transfers sit to stand without AD and with UE support  BALANCE: Standing Dynamic Balance  Normal Stand independently unsupported, able to weight shift and cross midline maximally   Good Stand independently unsupported, able to weight shift and cross midline moderately   Good-/Fair+ Stand independently unsupported, able to weight shift across midline minimally   Fair Stand independently unsupported, weight shift, and  reach ipsilaterally, loss of balance when crossing midline   Poor+ Able to stand with Min A and reach ipsilaterally, unable to weight shift x  Poor Able to stand with Mod A and minimally reach ipsilaterally, unable to cross midline.    Static Standing Balance  Normal Able to maintain  standing balance against maximal resistance   Good Able to maintain standing balance against moderate resistance   Good-/Fair+ Able to maintain standing balance against minimal resistance   Fair Able to stand unsupported without UE support and without LOB for 1-2 min   Fair- Requires Min A and UE support to maintain standing without loss of balance   Poor+ Requires mod A and UE support to maintain standing without loss of balance   Poor Requires max A and UE support to maintain standing balance without loss x     GAIT: Patient ambulates with decreased gait speed and without AD  OUTCOME MEASURES: TEST Outcome Interpretation  5 times sit<>stand 101.20sec >60 yo, >15 sec indicates increased risk for falls  10 meter walk test       .86          m/s <1.0 m/s indicates increased risk for falls; limited community ambulator  Timed up and Go       1870          sec <14 sec indicates increased risk for falls                 Treatment: Heel raises x 10 x 2  Sit to stand x 10 x 2 sets Patient needs cues for correct technique         Objective measurements completed on examination: See above findings.              PT Education - 12/09/17 1650    Education provided  Yes    Education Details  HEP    Person(s) Educated  Patient    Comprehension  Verbalized understanding       PT Short Term Goals - 12/09/17 1728      PT SHORT TERM GOAL #1   Title  Patient will be independent in home exercise program to improve strength/mobility for better functional independence with ADLs.    Time  4    Period  Weeks    Status  New    Target Date  01/06/18      PT SHORT TERM GOAL #2   Title  Patient  (< 71 years old) will complete five times sit to stand test in < 10 seconds indicating an increased LE strength and improved balance.    Time  4    Period  Weeks    Status  New    Target Date  01/06/18        PT Long Term Goals - 12/09/17 1730      PT LONG TERM GOAL #1   Title  Patient will increase Berg Balance score by > 6 points to demonstrate decreased fall risk during functional activities.    Time  8    Period  Weeks    Status  New    Target Date  02/03/18      PT LONG TERM GOAL #2   Title  Patient will increase six minute walk test distance to >1000 for progression to community ambulator and improve gait ability    Time  8    Period  Weeks    Status  New    Target Date  02/03/18      PT LONG TERM GOAL #3   Title  Patient will increase 10 meter walk test to >1.41m/s as to improve gait speed for better community ambulation and to reduce fall risk.    Time  8    Period  Weeks  Status  New    Target Date  02/03/18             Plan - 12/09/17 1725    Clinical Impression Statement  Patient is 84 year old female with dx of unsteadiness and frequent falls. She has decreased strength Bilateral hips and bilateral ankles.  Her outcome measures indicate a falls risk with decreased 5 x sit to stand, TUG, 6 MW. She has poor static and poor dynamic standing balance. She will benefit from skilled PT to improve strength and mobility and quality of life.     History and Personal Factors relevant to plan of care:  falls, intermittent dizziness    Clinical Decision Making  Moderate    Rehab Potential  Good    PT Frequency  2x / week    PT Duration  8 weeks    PT Treatment/Interventions  Balance training;Neuromuscular re-education;Therapeutic exercise;Patient/family education;Manual techniques;Therapeutic activities;Moist Heat;Aquatic Therapy;Functional mobility training    PT Next Visit Plan  HEP progression, balance training, strengthening    PT Home Exercise Plan  heel raises,  sit to stand    Consulted and Agree with Plan of Care  Patient;Family member/caregiver       Patient will benefit from skilled therapeutic intervention in order to improve the following deficits and impairments:  Decreased balance, Decreased endurance, Abnormal gait, Decreased mobility, Difficulty walking, Dizziness, Pain, Impaired flexibility, Decreased strength, Decreased activity tolerance  Visit Diagnosis: Unsteadiness on feet  Abnormality of gait and mobility  Muscle weakness (generalized)     Problem List Patient Active Problem List   Diagnosis Date Noted  . Peripheral neuropathy 12/04/2017  . General unsteadiness 10/01/2017  . Constipation 10/01/2017  . Esophageal dysphagia 04/08/2017  . Chronic radicular pain of lower back 04/08/2017  . Fall 03/04/2017  . Encounter for chronic pain management 11/11/2016  . Breast mass, right 09/29/2016  . Abdominal aortic atherosclerosis ([place]) 09/07/2015  . Insomnia 06/08/2015  . Health maintenance examination 12/08/2014  . Advanced care planning/counseling discussion 12/08/2014  . Chronic leg pain 10/13/2014  . Fatigue 10/28/2013  . Persistent atrial fibrillation ([place]) 04/24/2013  . Medicare annual wellness visit, subsequent 04/14/2012  . Atypical chest pain 03/16/2012  . RLS (restless legs syndrome) 02/07/2012  . DDD (degenerative disc disease), lumbar 12/18/2009  . MDD (major depressive disorder), recurrent episode ([place]) 12/10/2007  . POSTHERPETIC NEURALGIA 04/20/2007  . HLD (hyperlipidemia) 11/24/2006  . PLMD (periodic limb movement disorder) 11/24/2006  . Essential hypertension 11/24/2006  . GERD 11/24/2006  . GASTRIC ULCER 11/24/2006  . Osteoarthritis 11/24/2006  . Osteoporosis with pathological fracture 11/24/2006    Alanson Puls, PT DPT 12/09/2017, 5:33 PM  [place] MAIN [facility] SERVICES [address] [place], [place], [address] Phone: [phone]   Fax:   [phone]  Name: LAURALYN SHADOWENS MRN: [id number] Date of Birth: [birth year]

## 2017-12-11 ENCOUNTER — Ambulatory Visit: Payer: Medicare Other | Admitting: Physical Therapy

## 2017-12-11 ENCOUNTER — Encounter: Payer: Self-pay | Admitting: Physical Therapy

## 2017-12-11 DIAGNOSIS — R269 Unspecified abnormalities of gait and mobility: Secondary | ICD-10-CM

## 2017-12-11 DIAGNOSIS — M6281 Muscle weakness (generalized): Secondary | ICD-10-CM

## 2017-12-11 DIAGNOSIS — R2681 Unsteadiness on feet: Secondary | ICD-10-CM | POA: Diagnosis not present

## 2017-12-11 NOTE — Therapy (Signed)
[place] MAIN [facility] SERVICES [address] [place], [place], [address] Phone: [phone]   Fax:  [phone]  Physical Therapy Treatment  Patient Details  Name: [name] MRN: [id number] Date of Birth: 1929/07/13 Referring Provider: Ria Bush   Encounter Date: 12/11/2017  PT End of Session - 12/11/17 1656    Visit Number  2    Number of Visits  17    Date for PT Re-Evaluation  02/01/16    Authorization Type  2/10    PT Start Time  0445    PT Stop Time  0525    PT Time Calculation (min)  40 min    Equipment Utilized During Treatment  Gait belt    Activity Tolerance  Patient tolerated treatment well;Patient limited by fatigue    Behavior During Therapy  [facility] for tasks assessed/performed       Past Medical History:  Diagnosis Date  . Abdominal aortic atherosclerosis ([place]) 09/2015   by xray  . Anxiety   . BCC (basal cell carcinoma of skin) 05/2011   on nose  . Breast lesion    a. diagnosed with complex sclerosing lesion with calcifications of the right breast in 08/2016  . Chest pain    a. nuclear stress test 03/2012: normal  . DDD (degenerative disc disease), lumbosacral 12/06/1992  . Gallstones 09/2015   incidentally by xray  . GERD (gastroesophageal reflux disease) 05/1999  . HERPES ZOSTER 04/13/2007   Qualifier: Diagnosis of  By: [name], [name]   . History of shingles    ophthalmic, takes acyclovir daily  . Hyperlipidemia 12/2003  . Hypertension 1990  . Mitral regurgitation    a. echo 03/2012: EF 60-65%, no RWMA, mild to mod AI/MR, mod TR, PASP 37 mmHg  . Osteoarthritis 08/21/1989  . Osteoporosis with fracture 11/1997   with compression fracture T12  . Persistent atrial fibrillation ([place]) 03/16/2012   a. CHADS2VASc => 5 (HTN, age x 2, vascular disease, sex category)-->Eliquis 5 BID;  b. Recurrent AF in 06/2016;  c. 01/2017 s/p DCCV;  d. 02/2017 recurrent AFib->Amio added->DCCV; e. 03/2017 recurrent  AFib, s/p reload of amio and DCCV 04/02/2017  . Rheumatoid arthritis ([place])   . RLS (restless legs syndrome)     Past Surgical History:  Procedure Laterality Date  . ABDOMINAL HYSTERECTOMY  Age 23 - 16   S/P TAH  . abdominal ultrasound  04/23/2004   Gallstones  . BREAST LUMPECTOMY WITH RADIOACTIVE SEED LOCALIZATION Right 10/2016   breast lumpectomy with radioactive seed localization and margin assessment for complex sclerosing lesion [facility])  . BREAST LUMPECTOMY WITH RADIOACTIVE SEED LOCALIZATION Right 11/05/2016   Procedure: RIGHT BREAST LUMPECTOMY WITH RADIOACTIVE SEED LOCALIZATION;  Surgeon: Fanny Skates, MD;  Location: [place];  Service: General;  Laterality: Right;  . CARDIOVERSION N/A 01/31/2017   Procedure: CARDIOVERSION;  Surgeon: Wellington Hampshire, MD;  Location: [facility];  Service: Cardiovascular;  Laterality: N/A;  . CARDIOVERSION N/A 03/03/2017   Procedure: CARDIOVERSION;  Surgeon: Wellington Hampshire, MD;  Location: [place] [facility];  Service: Cardiovascular;  Laterality: N/A;  . CARDIOVERSION N/A 04/02/2017   Procedure: CARDIOVERSION;  Surgeon: Wellington Hampshire, MD;  Location: [facility];  Service: Cardiovascular;  Laterality: N/A;  . CATARACT EXTRACTION  05/2010   left eye  . CERVICAL DISCECTOMY  1992   Fusion (Dr. Joya Salm)  . ESI Bilateral 01/2016   S1 transforaminal ESI x3  . ESOPHAGOGASTRODUODENOSCOPY  01/01/2002   with ulcer, bx.  neg; + stricture gastric ulcer with hemorrhage  . ESOPHAGOGASTRODUODENOSCOPY  04/23/2004   Esophageal stricture -- dilated  . nuclear stress test  03/2012   no ischemia  . SKIN CANCER EXCISION  05/20/2011   BCC from nose  . US ECHOCARDIOGRAPHY  03/2012   in flutter, EF 60%, mild-mod aortic, mitral, tricuspid regurg, mildly dilated LA    There were no vitals filed for this visit.  Subjective Assessment - 12/11/17 1654    Subjective  Patient reports that she did her exercises yesterday and her back is hurting .     Patient  is accompained by:  Family member    Pertinent History  Patient has been having balance defictis and has had several falls in the past year.     Patient Stated Goals  "I just want it to quit hurting."    Currently in Pain?  Yes    Pain Score  4     Pain Location  Back    Pain Orientation  Lower    Pain Descriptors / Indicators  Aching    Pain Onset  More than a month ago    Pain Frequency  Intermittent    Aggravating Factors   walking    Pain Relieving Factors  tylenol, pain meds    Effect of Pain on Daily Activities  difficulty with turning in bed      Treatment Nu-step x 5 mins at 50 SPM level 2 Standing with YTB side stepping x 10 feet in parallel bars x 5 laps with UE support Standing hip extension x 15 BLE Standing hip abd x 15 BLE  Squats x 15  Heel raises x 15 BOSU ball lunges x 10 BLE  Step ups to 6 inch stool x 15  Eccentric step downs from 6 inch stool to blue foam x 10 BLE Bridges x 15 hooklying marching x 15  Supine SLR  X 10 BLE Leg press x 60 lbs x 20      CGA and Min to mod verbal cues used throughout with increased in postural sway and LOB most seen with narrow base of support and while on uneven surfaces. Continues to have balance deficits typical with diagnosis. Patient performs beginnign level exercises without pain behaviors and needs verbal cuing for postural alignment and head positioning                     PT Education - 12/11/17 1656    Education provided  Yes    Education Details  HEP     Person(s) Educated  Patient    Methods  Explanation    Comprehension  Verbalized understanding;Need further instruction       PT Short Term Goals - 12/09/17 1728      PT SHORT TERM GOAL #1   Title  Patient will be independent in home exercise program to improve strength/mobility for better functional independence with ADLs.    Time  4    Period  Weeks    Status  New    Target Date  01/06/18      PT SHORT TERM GOAL #2   Title  Patient (<  60 years old) will complete five times sit to stand test in < 10 seconds indicating an increased LE strength and improved balance.    Time  4    Period  Weeks    Status  New    Target Date  01/06/18  PT Long Term Goals - 12/09/17 1730      PT LONG TERM GOAL #1   Title  Patient will increase Berg Balance score by > 6 points to demonstrate decreased fall risk during functional activities.    Time  8    Period  Weeks    Status  New    Target Date  02/03/18      PT LONG TERM GOAL #2   Title  Patient will increase six minute walk test distance to >1000 for progression to community ambulator and improve gait ability    Time  8    Period  Weeks    Status  New    Target Date  02/03/18      PT LONG TERM GOAL #3   Title  Patient will increase 10 meter walk test to >1.78m/s as to improve gait speed for better community ambulation and to reduce fall risk.    Time  8    Period  Weeks    Status  New    Target Date  02/03/18            Plan - 12/11/17 1657    Clinical Impression Statement  Pt requires direction and verbal cues for correct performance of exercises. Patient demonstrates weakness in BLE and performs open and closed chain exercises with minimal reports of pain to left knee. Pt was able to perform all exercises with max assist and VC for technique. Patient struggles with speed during movement as well as balance with unstable surfaces.  Pt encouraged continuing new supine HEP .Follow-up as scheduled.    Rehab Potential  Good    PT Frequency  2x / week    PT Duration  8 weeks    PT Treatment/Interventions  Balance training;Neuromuscular re-education;Therapeutic exercise;Patient/family education;Manual techniques;Therapeutic activities;Moist Heat;Aquatic Therapy;Functional mobility training    PT Next Visit Plan  HEP progression, balance training, strengthening    PT Home Exercise Plan  heel raises, sit to stand    Consulted and Agree with Plan of Care  Patient;Family  member/caregiver       Patient will benefit from skilled therapeutic intervention in order to improve the following deficits and impairments:  Decreased balance, Decreased endurance, Abnormal gait, Decreased mobility, Difficulty walking, Dizziness, Pain, Impaired flexibility, Decreased strength, Decreased activity tolerance  Visit Diagnosis: Unsteadiness on feet  Abnormality of gait and mobility  Muscle weakness (generalized)     Problem List Patient Active Problem List   Diagnosis Date Noted  . Peripheral neuropathy 12/04/2017  . General unsteadiness 10/01/2017  . Constipation 10/01/2017  . Esophageal dysphagia 04/08/2017  . Chronic radicular pain of lower back 04/08/2017  . Fall 03/04/2017  . Encounter for chronic pain management 11/11/2016  . Breast mass, right 09/29/2016  . Abdominal aortic atherosclerosis ([place]) 09/07/2015  . Insomnia 06/08/2015  . Health maintenance examination 12/08/2014  . Advanced care planning/counseling discussion 12/08/2014  . Chronic leg pain 10/13/2014  . Fatigue 10/28/2013  . Persistent atrial fibrillation ([place]) 04/24/2013  . Medicare annual wellness visit, subsequent 04/14/2012  . Atypical chest pain 03/16/2012  . RLS (restless legs syndrome) 02/07/2012  . DDD (degenerative disc disease), lumbar 12/18/2009  . MDD (major depressive disorder), recurrent episode ([place]) 12/10/2007  . POSTHERPETIC NEURALGIA 04/20/2007  . HLD (hyperlipidemia) 11/24/2006  . PLMD (periodic limb movement disorder) 11/24/2006  . Essential hypertension 11/24/2006  . GERD 11/24/2006  . GASTRIC ULCER 11/24/2006  . Osteoarthritis 11/24/2006  . Osteoporosis with pathological fracture 11/24/2006  [address], [place] DPT 12/11/2017, 4:58 PM  [place] MAIN [facility] SERVICES [address] [place], [place], [address] Phone: [phone]   Fax:  [phone]  Name: [name] MRN: [id number] Date of Birth:  Jan 28, 1930

## 2017-12-16 ENCOUNTER — Ambulatory Visit: Payer: Medicare Other | Admitting: Physical Therapy

## 2017-12-17 ENCOUNTER — Ambulatory Visit: Payer: Medicare Other

## 2017-12-18 ENCOUNTER — Other Ambulatory Visit: Payer: Self-pay | Admitting: Family Medicine

## 2017-12-18 ENCOUNTER — Ambulatory Visit: Payer: Medicare Other | Admitting: Physical Therapy

## 2017-12-18 ENCOUNTER — Encounter: Payer: Self-pay | Admitting: Physical Therapy

## 2017-12-18 DIAGNOSIS — M6281 Muscle weakness (generalized): Secondary | ICD-10-CM

## 2017-12-18 DIAGNOSIS — R269 Unspecified abnormalities of gait and mobility: Secondary | ICD-10-CM

## 2017-12-18 DIAGNOSIS — R2681 Unsteadiness on feet: Secondary | ICD-10-CM

## 2017-12-18 NOTE — Therapy (Signed)
[place] MAIN [facility] SERVICES [address] [place], [place], [address] Phone: [phone]   Fax:  [phone]  Physical Therapy Treatment  Patient Details  Name: [name] MRN: [id number] Date of Birth: 03-May-1929 Referring Provider: Ria Bush   Encounter Date: 12/18/2017  PT End of Session - 12/18/17 1633    Visit Number  3    Number of Visits  17    Date for PT Re-Evaluation  02/01/16    Authorization Type  3/10    PT Start Time  0420    PT Stop Time  0500    PT Time Calculation (min)  40 min    Equipment Utilized During Treatment  Gait belt    Activity Tolerance  Patient tolerated treatment well;Patient limited by fatigue    Behavior During Therapy  [facility] for tasks assessed/performed       Past Medical History:  Diagnosis Date  . Abdominal aortic atherosclerosis ([place]) 09/2015   by xray  . Anxiety   . BCC (basal cell carcinoma of skin) 05/2011   on nose  . Breast lesion    a. diagnosed with complex sclerosing lesion with calcifications of the right breast in 08/2016  . Chest pain    a. nuclear stress test 03/2012: normal  . DDD (degenerative disc disease), lumbosacral 12/06/1992  . Gallstones 09/2015   incidentally by xray  . GERD (gastroesophageal reflux disease) 05/1999  . HERPES ZOSTER 04/13/2007   Qualifier: Diagnosis of  By: [name], [name]   . History of shingles    ophthalmic, takes acyclovir daily  . Hyperlipidemia 12/2003  . Hypertension 1990  . Mitral regurgitation    a. echo 03/2012: EF 60-65%, no RWMA, mild to mod AI/MR, mod TR, PASP 37 mmHg  . Osteoarthritis 08/21/1989  . Osteoporosis with fracture 11/1997   with compression fracture T12  . Persistent atrial fibrillation ([place]) 03/16/2012   a. CHADS2VASc => 5 (HTN, age x 2, vascular disease, sex category)-->Eliquis 5 BID;  b. Recurrent AF in 06/2016;  c. 01/2017 s/p DCCV;  d. 02/2017 recurrent AFib->Amio added->DCCV; e. 03/2017 recurrent  AFib, s/p reload of amio and DCCV 04/02/2017  . Rheumatoid arthritis ([place])   . RLS (restless legs syndrome)     Past Surgical History:  Procedure Laterality Date  . ABDOMINAL HYSTERECTOMY  Age 70 - 19   S/P TAH  . abdominal ultrasound  04/23/2004   Gallstones  . BREAST LUMPECTOMY WITH RADIOACTIVE SEED LOCALIZATION Right 10/2016   breast lumpectomy with radioactive seed localization and margin assessment for complex sclerosing lesion [facility])  . BREAST LUMPECTOMY WITH RADIOACTIVE SEED LOCALIZATION Right 11/05/2016   Procedure: RIGHT BREAST LUMPECTOMY WITH RADIOACTIVE SEED LOCALIZATION;  Surgeon: Fanny Skates, MD;  Location: [place];  Service: General;  Laterality: Right;  . CARDIOVERSION N/A 01/31/2017   Procedure: CARDIOVERSION;  Surgeon: Wellington Hampshire, MD;  Location: [facility];  Service: Cardiovascular;  Laterality: N/A;  . CARDIOVERSION N/A 03/03/2017   Procedure: CARDIOVERSION;  Surgeon: Wellington Hampshire, MD;  Location: [place] [facility];  Service: Cardiovascular;  Laterality: N/A;  . CARDIOVERSION N/A 04/02/2017   Procedure: CARDIOVERSION;  Surgeon: Wellington Hampshire, MD;  Location: [facility];  Service: Cardiovascular;  Laterality: N/A;  . CATARACT EXTRACTION  05/2010   left eye  . CERVICAL DISCECTOMY  1992   Fusion (Dr. Joya Salm)  . ESI Bilateral 01/2016   S1 transforaminal ESI x3  . ESOPHAGOGASTRODUODENOSCOPY  01/01/2002   with ulcer, bx.  neg; + stricture gastric ulcer with hemorrhage  . ESOPHAGOGASTRODUODENOSCOPY  04/23/2004   Esophageal stricture -- dilated  . nuclear stress test  03/2012   no ischemia  . SKIN CANCER EXCISION  05/20/2011   BCC from nose  . US ECHOCARDIOGRAPHY  03/2012   in flutter, EF 60%, mild-mod aortic, mitral, tricuspid regurg, mildly dilated LA    There were no vitals filed for this visit.  Subjective Assessment - 12/18/17 1632    Subjective  Patient reports that she did her exercises yesterday and her back is hurting .     Patient  is accompained by:  Family member    Pertinent History  Patient has been having balance defictis and has had several falls in the past year.     Patient Stated Goals  "I just want it to quit hurting."    Currently in Pain?  No/denies    Pain Score  0-No pain    Pain Onset  More than a month ago       Treatment:  Step over hurdle fwd/ bwd, side to side , CGA for safety, VCs to take big enough steps and to try to increase speed to work on coordination  Side stepping x10 on blue balance CGA for safety, VCs for taking a big enough step   mini squat unsupported x10 reps; heel/toe raises x15 reps with B rail assist for balance; Alternate march x15 bilaterally with cues to increase hip flexion for better ROM/strength; Standing in parallel bars , hip abd BLE x 15, hip extension BLE x 15, hip flex BLE x 15                        PT Education - 12/18/17 1632    Education provided  Yes    Education Details  HEP, balance, safety    Person(s) Educated  Patient    Methods  Explanation    Comprehension  Verbalized understanding       PT Short Term Goals - 12/09/17 1728      PT SHORT TERM GOAL #1   Title  Patient will be independent in home exercise program to improve strength/mobility for better functional independence with ADLs.    Time  4    Period  Weeks    Status  New    Target Date  01/06/18      PT SHORT TERM GOAL #2   Title  Patient (< 31 years old) will complete five times sit to stand test in < 10 seconds indicating an increased LE strength and improved balance.    Time  4    Period  Weeks    Status  New    Target Date  01/06/18        PT Long Term Goals - 12/09/17 1730      PT LONG TERM GOAL #1   Title  Patient will increase Berg Balance score by > 6 points to demonstrate decreased fall risk during functional activities.    Time  8    Period  Weeks    Status  New    Target Date  02/03/18      PT LONG TERM GOAL #2   Title  Patient will increase  six minute walk test distance to >1000 for progression to community ambulator and improve gait ability    Time  8    Period  Weeks    Status  New    Target Date  02/03/18      PT LONG TERM GOAL #3   Title  Patient will increase 10 meter walk test to >1.72m/s as to improve gait speed for better community ambulation and to reduce fall risk.    Time  8    Period  Weeks    Status  New    Target Date  02/03/18            Plan - 12/18/17 1634    Clinical Impression Statement  Pt presents with unsteadiness on uneven surfaces and fatigues with therapeutic exercises. Patient needs assist with beginning moderate balance activities and needs CGA assist with standing activities. Patient demonstrates difficulty with dynamic standing balance and with narrow  base of support and increased challenges for LE.  Patient tolerated all interventions well this date and skilled PT will continue to improve mobility and strength.     Rehab Potential  Good    PT Frequency  2x / week    PT Duration  8 weeks    PT Treatment/Interventions  Balance training;Neuromuscular re-education;Therapeutic exercise;Patient/family education;Manual techniques;Therapeutic activities;Moist Heat;Aquatic Therapy;Functional mobility training    PT Next Visit Plan  HEP progression, balance training, strengthening    PT Home Exercise Plan  heel raises, sit to stand    Consulted and Agree with Plan of Care  Patient;Family member/caregiver       Patient will benefit from skilled therapeutic intervention in order to improve the following deficits and impairments:  Decreased balance, Decreased endurance, Abnormal gait, Decreased mobility, Difficulty walking, Dizziness, Pain, Impaired flexibility, Decreased strength, Decreased activity tolerance  Visit Diagnosis: Unsteadiness on feet  Abnormality of gait and mobility  Muscle weakness (generalized)     Problem List Patient Active Problem List   Diagnosis Date Noted  .  Peripheral neuropathy 12/04/2017  . General unsteadiness 10/01/2017  . Constipation 10/01/2017  . Esophageal dysphagia 04/08/2017  . Chronic radicular pain of lower back 04/08/2017  . Fall 03/04/2017  . Encounter for chronic pain management 11/11/2016  . Breast mass, right 09/29/2016  . Abdominal aortic atherosclerosis ([place]) 09/07/2015  . Insomnia 06/08/2015  . Health maintenance examination 12/08/2014  . Advanced care planning/counseling discussion 12/08/2014  . Chronic leg pain 10/13/2014  . Fatigue 10/28/2013  . Persistent atrial fibrillation ([place]) 04/24/2013  . Medicare annual wellness visit, subsequent 04/14/2012  . Atypical chest pain 03/16/2012  . RLS (restless legs syndrome) 02/07/2012  . DDD (degenerative disc disease), lumbar 12/18/2009  . MDD (major depressive disorder), recurrent episode ([place]) 12/10/2007  . POSTHERPETIC NEURALGIA 04/20/2007  . HLD (hyperlipidemia) 11/24/2006  . PLMD (periodic limb movement disorder) 11/24/2006  . Essential hypertension 11/24/2006  . GERD 11/24/2006  . GASTRIC ULCER 11/24/2006  . Osteoarthritis 11/24/2006  . Osteoporosis with pathological fracture 11/24/2006    Alanson Puls, PT DPT 12/18/2017, 4:35 PM  [place] MAIN [facility] SERVICES [address] [place], [place], [address] Phone: [phone]   Fax:  [phone]  Name: [name] MRN: [id number] Date of Birth: [birth year]

## 2017-12-23 ENCOUNTER — Ambulatory Visit: Payer: Medicare Other | Admitting: Physical Therapy

## 2017-12-23 ENCOUNTER — Encounter: Payer: Self-pay | Admitting: Physical Therapy

## 2017-12-23 DIAGNOSIS — M6281 Muscle weakness (generalized): Secondary | ICD-10-CM | POA: Diagnosis not present

## 2017-12-23 DIAGNOSIS — R2681 Unsteadiness on feet: Secondary | ICD-10-CM | POA: Diagnosis not present

## 2017-12-23 DIAGNOSIS — R269 Unspecified abnormalities of gait and mobility: Secondary | ICD-10-CM | POA: Diagnosis not present

## 2017-12-23 NOTE — Therapy (Signed)
[place] MAIN [facility] SERVICES [address] [place], [place], [address] Phone: [phone]   Fax:  [phone]  Physical Therapy Treatment  Patient Details  Name: AHRIA SLAPPEY MRN: [id number] Date of Birth: [birth year] Referring Provider: Ria Bush   Encounter Date: 12/23/2017  PT End of Session - 12/23/17 1659    Visit Number  4    Number of Visits  17    Date for PT Re-Evaluation  02/01/16    Authorization Type  4/10    PT Start Time  0455    PT Stop Time  0535    PT Time Calculation (min)  40 min    Equipment Utilized During Treatment  Gait belt    Activity Tolerance  Patient tolerated treatment well;Patient limited by fatigue    Behavior During Therapy  [facility] for tasks assessed/performed       Past Medical History:  Diagnosis Date  . Abdominal aortic atherosclerosis ([place]) 09/2015   by xray  . Anxiety   . BCC (basal cell carcinoma of skin) 05/2011   on nose  . Breast lesion    a. diagnosed with complex sclerosing lesion with calcifications of the right breast in 08/2016  . Chest pain    a. nuclear stress test 03/2012: normal  . DDD (degenerative disc disease), lumbosacral 12/06/1992  . Gallstones 09/2015   incidentally by xray  . GERD (gastroesophageal reflux disease) 05/1999  . HERPES ZOSTER 04/13/2007   Qualifier: Diagnosis of  By: [name], [name]   . History of shingles    ophthalmic, takes acyclovir daily  . Hyperlipidemia 12/2003  . Hypertension 1990  . Mitral regurgitation    a. echo 03/2012: EF 60-65%, no RWMA, mild to mod AI/MR, mod TR, PASP 37 mmHg  . Osteoarthritis 08/21/1989  . Osteoporosis with fracture 11/1997   with compression fracture T12  . Persistent atrial fibrillation ([place]) 03/16/2012   a. CHADS2VASc => 5 (HTN, age x 2, vascular disease, sex category)-->Eliquis 5 BID;  b. Recurrent AF in 06/2016;  c. 01/2017 s/p DCCV;  d. 02/2017 recurrent AFib->Amio added->DCCV; e. 03/2017 recurrent  AFib, s/p reload of amio and DCCV 04/02/2017  . Rheumatoid arthritis ([place])   . RLS (restless legs syndrome)     Past Surgical History:  Procedure Laterality Date  . ABDOMINAL HYSTERECTOMY  Age 84 - 5   S/P TAH  . abdominal ultrasound  04/23/2004   Gallstones  . BREAST LUMPECTOMY WITH RADIOACTIVE SEED LOCALIZATION Right 10/2016   breast lumpectomy with radioactive seed localization and margin assessment for complex sclerosing lesion [facility])  . BREAST LUMPECTOMY WITH RADIOACTIVE SEED LOCALIZATION Right 11/05/2016   Procedure: RIGHT BREAST LUMPECTOMY WITH RADIOACTIVE SEED LOCALIZATION;  Surgeon: Fanny Skates, MD;  Location: [place];  Service: General;  Laterality: Right;  . CARDIOVERSION N/A 01/31/2017   Procedure: CARDIOVERSION;  Surgeon: Wellington Hampshire, MD;  Location: [facility];  Service: Cardiovascular;  Laterality: N/A;  . CARDIOVERSION N/A 03/03/2017   Procedure: CARDIOVERSION;  Surgeon: Wellington Hampshire, MD;  Location: [place] [facility];  Service: Cardiovascular;  Laterality: N/A;  . CARDIOVERSION N/A 04/02/2017   Procedure: CARDIOVERSION;  Surgeon: Wellington Hampshire, MD;  Location: [facility];  Service: Cardiovascular;  Laterality: N/A;  . CATARACT EXTRACTION  05/2010   left eye  . CERVICAL DISCECTOMY  1992   Fusion (Dr. Joya Salm)  . ESI Bilateral 01/2016   S1 transforaminal ESI x3  . ESOPHAGOGASTRODUODENOSCOPY  01/01/2002   with ulcer, bx.  neg; + stricture gastric ulcer with hemorrhage  . ESOPHAGOGASTRODUODENOSCOPY  04/23/2004   Esophageal stricture -- dilated  . nuclear stress test  03/2012   no ischemia  . SKIN CANCER EXCISION  05/20/2011   BCC from nose  . US ECHOCARDIOGRAPHY  03/2012   in flutter, EF 60%, mild-mod aortic, mitral, tricuspid regurg, mildly dilated LA    There were no vitals filed for this visit.  Subjective Assessment - 12/23/17 1700    Subjective  Patient reports that she did her exercises yesterday and her back is hurting .     Patient  is accompained by:  Family member    Pertinent History  Patient has been having balance defictis and has had several falls in the past year.     Patient Stated Goals  "I just want it to quit hurting."    Currently in Pain?  No/denies    Pain Score  0-No pain    Pain Onset  More than a month ago          Treatment:  Seated LAQ x 15 x 2   Seated hip flex x 15 x 2   Marching in hooklying x 15 x 2,  with TA contraction  SAQ x 15 x 2 , 3 lbs and 3 sec hold  SLR X 15 BLE , intervals x 2   sidellying hip abd x 15 x 2 BLE  Standing hip abd x 15 BLE  sidelying clam x 15 with GTB x 15 x 2   Bridges x 15 with 3 sec hold x 15 x 2.  Heel raises x 15 x 2  CGA and  mod verbal cues used throughout with increased in postural sway and LOB most seen with narrow base of support  Patient performs intermediate level exercises without pain behaviors and needs verbal cuing for postural alignment and head positioning.                       PT Education - 12/23/17 1659    Education provided  Yes    Education Details  saftey with rollator, HEP    Person(s) Educated  Patient    Methods  Explanation;Demonstration;Tactile cues;Verbal cues    Comprehension  Verbalized understanding;Need further instruction       PT Short Term Goals - 12/09/17 1728      PT SHORT TERM GOAL #1   Title  Patient will be independent in home exercise program to improve strength/mobility for better functional independence with ADLs.    Time  4    Period  Weeks    Status  New    Target Date  01/06/18      PT SHORT TERM GOAL #2   Title  Patient (< 24 years old) will complete five times sit to stand test in < 10 seconds indicating an increased LE strength and improved balance.    Time  4    Period  Weeks    Status  New    Target Date  01/06/18        PT Long Term Goals - 12/09/17 1730      PT LONG TERM GOAL #1   Title  Patient will increase Berg Balance score by > 6 points to demonstrate  decreased fall risk during functional activities.    Time  8    Period  Weeks    Status  New    Target Date  02/03/18  PT LONG TERM GOAL #2   Title  Patient will increase six minute walk test distance to >1000 for progression to community ambulator and improve gait ability    Time  8    Period  Weeks    Status  New    Target Date  02/03/18      PT LONG TERM GOAL #3   Title  Patient will increase 10 meter walk test to >1.66m/s as to improve gait speed for better community ambulation and to reduce fall risk.    Time  8    Period  Weeks    Status  New    Target Date  02/03/18            Plan - 12/24/17 0813    Clinical Impression Statement  Pt responded well to all interventions today.  Focus on core and hip strengthening in order to improve balance and ambulation.  Pt attempted hip ABD with weight and bridges against gravity, but was unable to perform against resistance of gravity with hip abd and was able to perform a modified bridge , secondary to weakness.  Pt was able to complete core exercises, requiring cueing for proper form and hip alignment during seated and supine core activities. Pt would continue to benefit from skilled therapy services in order to improve LE and core strength, gait and functional mobility in order to decrease risk of falls and improve independence with mobility.    Rehab Potential  Good    PT Frequency  2x / week    PT Duration  8 weeks    PT Treatment/Interventions  Balance training;Neuromuscular re-education;Therapeutic exercise;Patient/family education;Manual techniques;Therapeutic activities;Moist Heat;Aquatic Therapy;Functional mobility training    PT Next Visit Plan  HEP progression, balance training, strengthening    PT Home Exercise Plan  heel raises, sit to stand    Consulted and Agree with Plan of Care  Patient;Family member/caregiver       Patient will benefit from skilled therapeutic intervention in order to improve the following  deficits and impairments:  Decreased balance, Decreased endurance, Abnormal gait, Decreased mobility, Difficulty walking, Dizziness, Pain, Impaired flexibility, Decreased strength, Decreased activity tolerance  Visit Diagnosis: Unsteadiness on feet  Abnormality of gait and mobility  Muscle weakness (generalized)     Problem List Patient Active Problem List   Diagnosis Date Noted  . Peripheral neuropathy 12/04/2017  . General unsteadiness 10/01/2017  . Constipation 10/01/2017  . Esophageal dysphagia 04/08/2017  . Chronic radicular pain of lower back 04/08/2017  . Fall 03/04/2017  . Encounter for chronic pain management 11/11/2016  . Breast mass, right 09/29/2016  . Abdominal aortic atherosclerosis ([place]) 09/07/2015  . Insomnia 06/08/2015  . Health maintenance examination 12/08/2014  . Advanced care planning/counseling discussion 12/08/2014  . Chronic leg pain 10/13/2014  . Fatigue 10/28/2013  . Persistent atrial fibrillation ([place]) 04/24/2013  . Medicare annual wellness visit, subsequent 04/14/2012  . Atypical chest pain 03/16/2012  . RLS (restless legs syndrome) 02/07/2012  . DDD (degenerative disc disease), lumbar 12/18/2009  . MDD (major depressive disorder), recurrent episode ([name]) 12/10/2007  . POSTHERPETIC NEURALGIA 04/20/2007  . HLD (hyperlipidemia) 11/24/2006  . PLMD (periodic limb movement disorder) 11/24/2006  . Essential hypertension 11/24/2006  . GERD 11/24/2006  . GASTRIC ULCER 11/24/2006  . Osteoarthritis 11/24/2006  . Osteoporosis with pathological fracture 11/24/2006    Alanson Puls, PT DPT 12/24/2017, 8:15 AM  [place] MAIN [facility] SERVICES [address] [place], [place], [address] Phone:  [phone]   Fax:  [phone]  Name: SHIVALI QUACKENBUSH MRN: [id number] Date of Birth: 1930/03/19

## 2017-12-23 NOTE — Patient Instructions (Signed)
Heel Raises    Stand with support. Tighten pelvic floor and hold. With knees straight, raise heels off ground.  Repeat _20__ times. Do _2__ times a day.  Copyright  VHI. All rights reserved.  Hip (Front)    Begin sitting tall, both feet flat on floor. Inhale, then exhale while lifting knee as high as is comfortable, keeping upper body straight and still. Slowly return to starting position. Repeat _10___ times each leg. Do __2__ sets per session. Do _2___ sessions per day.   Copyright  VHI. All rights reserved.  HIP: Abduction - Side-Lying    Lie on side, legs straight and in line with trunk. Raise top leg up and slightly back. Point toes forward. _10__ reps per set, __2_ sets per day, _7__ days per week Bend bottom leg to stabilize pelvis.  Copyright  VHI. All rights reserved.  Hip Abduction: Modified    Lying on right side with pillow between thighs, raise top leg from pillow, rotating slightly out. Repeat __10__ times per set. Do __2__ sets per session. Do ___2_ sessions per day.  http://orth.exer.us/705   Copyright  VHI. All rights reserved.  Hip Abduction / Adduction: with Knee Flexion (Supine)  Bridge    Lie back, legs bent. Inhale, pressing hips up. Keeping ribs in, lengthen lower back. Exhale, rolling down along spine from top. Repeat _10___ times. Do __2__ sessions per day.  http://pm.exer.us/55   Copyright  VHI. All rights reserved.  .  http://orth.exer.us/683   Copyright  VHI. All rights reserved.

## 2017-12-25 ENCOUNTER — Encounter: Payer: Self-pay | Admitting: Physical Therapy

## 2017-12-25 ENCOUNTER — Ambulatory Visit: Payer: Medicare Other | Admitting: Physical Therapy

## 2017-12-25 DIAGNOSIS — R269 Unspecified abnormalities of gait and mobility: Secondary | ICD-10-CM | POA: Diagnosis not present

## 2017-12-25 DIAGNOSIS — R2681 Unsteadiness on feet: Secondary | ICD-10-CM | POA: Diagnosis not present

## 2017-12-25 DIAGNOSIS — M6281 Muscle weakness (generalized): Secondary | ICD-10-CM | POA: Diagnosis not present

## 2017-12-25 NOTE — Therapy (Signed)
[place] MAIN [facility] SERVICES [address] [place], [place], [address] Phone: [phone]   Fax:  [phone]  Physical Therapy Treatment  Patient Details  Name: [name] MRN: [id number] Date of Birth: Oct 22, 1929 Referring Provider: Ria Bush   Encounter Date: 12/25/2017  PT End of Session - 12/25/17 1644    Visit Number  5    Number of Visits  17    Date for PT Re-Evaluation  02/01/16    Authorization Type  5/10    PT Start Time  0415    PT Stop Time  0500    PT Time Calculation (min)  45 min    Equipment Utilized During Treatment  Gait belt    Activity Tolerance  Patient tolerated treatment well;Patient limited by fatigue    Behavior During Therapy  [facility] for tasks assessed/performed       Past Medical History:  Diagnosis Date  . Abdominal aortic atherosclerosis ([place]) 09/2015   by xray  . Anxiety   . BCC (basal cell carcinoma of skin) 05/2011   on nose  . Breast lesion    a. diagnosed with complex sclerosing lesion with calcifications of the right breast in 08/2016  . Chest pain    a. nuclear stress test 03/2012: normal  . DDD (degenerative disc disease), lumbosacral 12/06/1992  . Gallstones 09/2015   incidentally by xray  . GERD (gastroesophageal reflux disease) 05/1999  . HERPES ZOSTER 04/13/2007   Qualifier: Diagnosis of  By: [name], [name]   . History of shingles    ophthalmic, takes acyclovir daily  . Hyperlipidemia 12/2003  . Hypertension 1990  . Mitral regurgitation    a. echo 03/2012: EF 60-65%, no RWMA, mild to mod AI/MR, mod TR, PASP 37 mmHg  . Osteoarthritis 08/21/1989  . Osteoporosis with fracture 11/1997   with compression fracture T12  . Persistent atrial fibrillation ([place]) 03/16/2012   a. CHADS2VASc => 5 (HTN, age x 2, vascular disease, sex category)-->Eliquis 5 BID;  b. Recurrent AF in 06/2016;  c. 01/2017 s/p DCCV;  d. 02/2017 recurrent AFib->Amio added->DCCV; e. 03/2017 recurrent  AFib, s/p reload of amio and DCCV 04/02/2017  . Rheumatoid arthritis ([place])   . RLS (restless legs syndrome)     Past Surgical History:  Procedure Laterality Date  . ABDOMINAL HYSTERECTOMY  Age 24 - 55   S/P TAH  . abdominal ultrasound  04/23/2004   Gallstones  . BREAST LUMPECTOMY WITH RADIOACTIVE SEED LOCALIZATION Right 10/2016   breast lumpectomy with radioactive seed localization and margin assessment for complex sclerosing lesion [facility])  . BREAST LUMPECTOMY WITH RADIOACTIVE SEED LOCALIZATION Right 11/05/2016   Procedure: RIGHT BREAST LUMPECTOMY WITH RADIOACTIVE SEED LOCALIZATION;  Surgeon: Fanny Skates, MD;  Location: [place];  Service: General;  Laterality: Right;  . CARDIOVERSION N/A 01/31/2017   Procedure: CARDIOVERSION;  Surgeon: Wellington Hampshire, MD;  Location: [facility];  Service: Cardiovascular;  Laterality: N/A;  . CARDIOVERSION N/A 03/03/2017   Procedure: CARDIOVERSION;  Surgeon: Wellington Hampshire, MD;  Location: [place] [facility];  Service: Cardiovascular;  Laterality: N/A;  . CARDIOVERSION N/A 04/02/2017   Procedure: CARDIOVERSION;  Surgeon: Wellington Hampshire, MD;  Location: [facility];  Service: Cardiovascular;  Laterality: N/A;  . CATARACT EXTRACTION  05/2010   left eye  . CERVICAL DISCECTOMY  1992   Fusion (Dr. Joya Salm)  . ESI Bilateral 01/2016   S1 transforaminal ESI x3  . ESOPHAGOGASTRODUODENOSCOPY  01/01/2002   with ulcer, bx.  neg; + stricture gastric ulcer with hemorrhage  . ESOPHAGOGASTRODUODENOSCOPY  04/23/2004   Esophageal stricture -- dilated  . nuclear stress test  03/2012   no ischemia  . SKIN CANCER EXCISION  05/20/2011   BCC from nose  . US ECHOCARDIOGRAPHY  03/2012   in flutter, EF 60%, mild-mod aortic, mitral, tricuspid regurg, mildly dilated LA    There were no vitals filed for this visit.  Subjective Assessment - 12/25/17 1628    Subjective  Patient reports that she did her exercises yesterday and her back is hurting .     Patient  is accompained by:  Family member    Pertinent History  Patient has been having balance defictis and has had several falls in the past year.     Patient Stated Goals  "I just want it to quit hurting."    Currently in Pain?  Yes    Pain Score  3     Pain Onset  More than a month ago       Treatment Nu-step x 5 mins at 50 SPM level 2 Standing with YTB side stepping x 10 feet in parallel bars x 5 laps with UE support Standing hip extension x 15 BLE Standing hip abd x 15 BLE  Squats x 15  Heel raises x 15 BOSU ball lunges x 10 BLE  Step ups to 6 inch stool x 15  Eccentric step downs from 6 inch stool to blue foam x 10 BLE Bridges x 15 hooklying marching x 15  Supine SLR  X 10 BLE Leg press x 60 lbs x 20    CGA and Min to mod verbal cues used throughout with increased in postural sway and LOB most seen with narrow base of support and while on uneven surfaces. Continues to have balance deficits typical with diagnosis. Patient performs beginnign level exercises without pain behaviors and needs verbal cuing for postural alignment and head positioning                          PT Education - 12/25/17 1642    Education provided  Yes    Education Details  safatey and HEP    Person(s) Educated  Patient    Methods  Explanation    Comprehension  Verbalized understanding;Need further instruction;Returned demonstration       PT Short Term Goals - 12/09/17 1728      PT SHORT TERM GOAL #1   Title  Patient will be independent in home exercise program to improve strength/mobility for better functional independence with ADLs.    Time  4    Period  Weeks    Status  New    Target Date  01/06/18      PT SHORT TERM GOAL #2   Title  Patient (< 18 years old) will complete five times sit to stand test in < 10 seconds indicating an increased LE strength and improved balance.    Time  4    Period  Weeks    Status  New    Target Date  01/06/18        PT Long Term Goals -  12/09/17 1730      PT LONG TERM GOAL #1   Title  Patient will increase Berg Balance score by > 6 points to demonstrate decreased fall risk during functional activities.    Time  8    Period  Weeks    Status  New  Target Date  02/03/18      PT LONG TERM GOAL #2   Title  Patient will increase six minute walk test distance to >1000 for progression to community ambulator and improve gait ability    Time  8    Period  Weeks    Status  New    Target Date  02/03/18      PT LONG TERM GOAL #3   Title  Patient will increase 10 meter walk test to >1.26m/s as to improve gait speed for better community ambulation and to reduce fall risk.    Time  8    Period  Weeks    Status  New    Target Date  02/03/18            Plan - 12/25/17 1652    Clinical Impression Statement  Pt requires direction and verbal cues for correct performance of exercises. Patient demonstrates weakness in BLE and performs open and closed chain exercises with minimal reports of pain to left knee. Pt was able to perform all exercises with max assist and VC for technique. Patient struggles with speed during movement as well as balance with unstable surfaces. Pt encouraged continuing new supine HEP .Follow-up as scheduled.    Rehab Potential  Good    PT Frequency  2x / week    PT Duration  8 weeks    PT Treatment/Interventions  Balance training;Neuromuscular re-education;Therapeutic exercise;Patient/family education;Manual techniques;Therapeutic activities;Moist Heat;Aquatic Therapy;Functional mobility training    PT Next Visit Plan  HEP progression, balance training, strengthening    PT Home Exercise Plan  heel raises, sit to stand    Consulted and Agree with Plan of Care  Patient;Family member/caregiver       Patient will benefit from skilled therapeutic intervention in order to improve the following deficits and impairments:  Decreased balance, Decreased endurance, Abnormal gait, Decreased mobility, Difficulty  walking, Dizziness, Pain, Impaired flexibility, Decreased strength, Decreased activity tolerance  Visit Diagnosis: Unsteadiness on feet  Abnormality of gait and mobility  Muscle weakness (generalized)     Problem List Patient Active Problem List   Diagnosis Date Noted  . Peripheral neuropathy 12/04/2017  . General unsteadiness 10/01/2017  . Constipation 10/01/2017  . Esophageal dysphagia 04/08/2017  . Chronic radicular pain of lower back 04/08/2017  . Fall 03/04/2017  . Encounter for chronic pain management 11/11/2016  . Breast mass, right 09/29/2016  . Abdominal aortic atherosclerosis ([place]) 09/07/2015  . Insomnia 06/08/2015  . Health maintenance examination 12/08/2014  . Advanced care planning/counseling discussion 12/08/2014  . Chronic leg pain 10/13/2014  . Fatigue 10/28/2013  . Persistent atrial fibrillation ([place]) 04/24/2013  . Medicare annual wellness visit, subsequent 04/14/2012  . Atypical chest pain 03/16/2012  . RLS (restless legs syndrome) 02/07/2012  . DDD (degenerative disc disease), lumbar 12/18/2009  . MDD (major depressive disorder), recurrent episode ([place]) 12/10/2007  . POSTHERPETIC NEURALGIA 04/20/2007  . HLD (hyperlipidemia) 11/24/2006  . PLMD (periodic limb movement disorder) 11/24/2006  . Essential hypertension 11/24/2006  . GERD 11/24/2006  . GASTRIC ULCER 11/24/2006  . Osteoarthritis 11/24/2006  . Osteoporosis with pathological fracture 11/24/2006    Arelia Sneddon S,PT DPT 12/25/2017, 4:54 PM  [place] MAIN [facility] SERVICES [address] [place], [place], [address] Phone: [phone]   Fax:  [phone]  Name: [name] MRN: [id number] Date of Birth: May 31, 1929

## 2017-12-29 ENCOUNTER — Encounter: Payer: Self-pay | Admitting: Physical Therapy

## 2017-12-29 ENCOUNTER — Ambulatory Visit: Payer: Medicare Other | Admitting: Physical Therapy

## 2017-12-29 DIAGNOSIS — R2681 Unsteadiness on feet: Secondary | ICD-10-CM | POA: Diagnosis not present

## 2017-12-29 DIAGNOSIS — M6281 Muscle weakness (generalized): Secondary | ICD-10-CM | POA: Diagnosis not present

## 2017-12-29 DIAGNOSIS — R269 Unspecified abnormalities of gait and mobility: Secondary | ICD-10-CM | POA: Diagnosis not present

## 2017-12-29 NOTE — Therapy (Signed)
[place] MAIN [facility] SERVICES [address] [place], [place], [address] Phone: [phone]   Fax:  [phone]  Physical Therapy Treatment  Patient Details  Name: [name] MRN: [id number] Date of Birth: 1929-08-23 Referring Provider: Ria Bush   Encounter Date: 12/29/2017  PT End of Session - 12/29/17 1445    Visit Number  6    Number of Visits  17    Date for PT Re-Evaluation  02/01/16    Authorization Type  6/10    PT Start Time  0235    PT Stop Time  0315    PT Time Calculation (min)  40 min    Equipment Utilized During Treatment  Gait belt    Activity Tolerance  Patient tolerated treatment well;Patient limited by fatigue    Behavior During Therapy  [facility] for tasks assessed/performed       Past Medical History:  Diagnosis Date  . Abdominal aortic atherosclerosis ([place]) 09/2015   by xray  . Anxiety   . BCC (basal cell carcinoma of skin) 05/2011   on nose  . Breast lesion    a. diagnosed with complex sclerosing lesion with calcifications of the right breast in 08/2016  . Chest pain    a. nuclear stress test 03/2012: normal  . DDD (degenerative disc disease), lumbosacral 12/06/1992  . Gallstones 09/2015   incidentally by xray  . GERD (gastroesophageal reflux disease) 05/1999  . HERPES ZOSTER 04/13/2007   Qualifier: Diagnosis of  By: [name], [name]   . History of shingles    ophthalmic, takes acyclovir daily  . Hyperlipidemia 12/2003  . Hypertension 1990  . Mitral regurgitation    a. echo 03/2012: EF 60-65%, no RWMA, mild to mod AI/MR, mod TR, PASP 37 mmHg  . Osteoarthritis 08/21/1989  . Osteoporosis with fracture 11/1997   with compression fracture T12  . Persistent atrial fibrillation ([place]) 03/16/2012   a. CHADS2VASc => 5 (HTN, age x 2, vascular disease, sex category)-->Eliquis 5 BID;  b. Recurrent AF in 06/2016;  c. 01/2017 s/p DCCV;  d. 02/2017 recurrent AFib->Amio added->DCCV; e. 03/2017 recurrent  AFib, s/p reload of amio and DCCV 04/02/2017  . Rheumatoid arthritis ([place])   . RLS (restless legs syndrome)     Past Surgical History:  Procedure Laterality Date  . ABDOMINAL HYSTERECTOMY  Age 74 - 36   S/P TAH  . abdominal ultrasound  04/23/2004   Gallstones  . BREAST LUMPECTOMY WITH RADIOACTIVE SEED LOCALIZATION Right 10/2016   breast lumpectomy with radioactive seed localization and margin assessment for complex sclerosing lesion [facility])  . BREAST LUMPECTOMY WITH RADIOACTIVE SEED LOCALIZATION Right 11/05/2016   Procedure: RIGHT BREAST LUMPECTOMY WITH RADIOACTIVE SEED LOCALIZATION;  Surgeon: Fanny Skates, MD;  Location: [place];  Service: General;  Laterality: Right;  . CARDIOVERSION N/A 01/31/2017   Procedure: CARDIOVERSION;  Surgeon: Wellington Hampshire, MD;  Location: [facility];  Service: Cardiovascular;  Laterality: N/A;  . CARDIOVERSION N/A 03/03/2017   Procedure: CARDIOVERSION;  Surgeon: Wellington Hampshire, MD;  Location: [place] [facility];  Service: Cardiovascular;  Laterality: N/A;  . CARDIOVERSION N/A 04/02/2017   Procedure: CARDIOVERSION;  Surgeon: Wellington Hampshire, MD;  Location: [facility];  Service: Cardiovascular;  Laterality: N/A;  . CATARACT EXTRACTION  05/2010   left eye  . CERVICAL DISCECTOMY  1992   Fusion (Dr. Joya Salm)  . ESI Bilateral 01/2016   S1 transforaminal ESI x3  . ESOPHAGOGASTRODUODENOSCOPY  01/01/2002   with ulcer, bx.  neg; + stricture gastric ulcer with hemorrhage  . ESOPHAGOGASTRODUODENOSCOPY  04/23/2004   Esophageal stricture -- dilated  . nuclear stress test  03/2012   no ischemia  . SKIN CANCER EXCISION  05/20/2011   BCC from nose  . US ECHOCARDIOGRAPHY  03/2012   in flutter, EF 60%, mild-mod aortic, mitral, tricuspid regurg, mildly dilated LA    There were no vitals filed for this visit.  Subjective Assessment - 12/29/17 1444    Subjective  Patient reports that she did her exercises yesterday and her back is hurting .     Patient  is accompained by:  Family member    Pertinent History  Patient has been having balance defictis and has had several falls in the past year.     Patient Stated Goals  "I just want it to quit hurting."    Currently in Pain?  Yes    Pain Score  4     Pain Location  Back    Pain Orientation  Lower    Pain Descriptors / Indicators  Aching    Pain Type  Chronic pain    Pain Onset  More than a month ago    Pain Frequency  Constant    Aggravating Factors   activity    Pain Relieving Factors  nothing    Effect of Pain on Daily Activities  none    Multiple Pain Sites  No        Treatment: balloon tapping on mirror on foam , feet together, 25 % min assist  Step over hurdle fwd/ bwd, side to side , CGA for safety, VCs to take big enough steps and to try to increase speed to work on coordination  Side stepping x10 on blue balance CGA for safety, VCs for taking a big enough step   mini squat unsupported x10 reps; heel/toe raises x15 reps with B rail assist for balance; Alternate march x15 bilaterally with cues to increase hip flexion for better ROM/strength; Standing in parallel bars , hip abd BLE x 15, hip extension BLE x 15, hip flex BLE x 15   Supine hooklying: marching with TA x 20   sidelying hip abd x 15 BLE   Bridging x 15                       PT Education - 12/29/17 1445    Education provided  Yes    Person(s) Educated  Patient    Methods  Explanation;Demonstration    Comprehension  Verbalized understanding;Returned demonstration       PT Short Term Goals - 12/09/17 1728      PT SHORT TERM GOAL #1   Title  Patient will be independent in home exercise program to improve strength/mobility for better functional independence with ADLs.    Time  4    Period  Weeks    Status  New    Target Date  01/06/18      PT SHORT TERM GOAL #2   Title  Patient (< 55 years old) will complete five times sit to stand test in < 10 seconds indicating an increased LE  strength and improved balance.    Time  4    Period  Weeks    Status  New    Target Date  01/06/18        PT Long Term Goals - 12/09/17 1730      PT LONG TERM GOAL #1   Title  Patient will increase Berg Balance score by > 6 points to demonstrate decreased fall risk during functional activities.    Time  8    Period  Weeks    Status  New    Target Date  02/03/18      PT LONG TERM GOAL #2   Title  Patient will increase six minute walk test distance to >1000 for progression to community ambulator and improve gait ability    Time  8    Period  Weeks    Status  New    Target Date  02/03/18      PT LONG TERM GOAL #3   Title  Patient will increase 10 meter walk test to >1.50m/s as to improve gait speed for better community ambulation and to reduce fall risk.    Time  8    Period  Weeks    Status  New    Target Date  02/03/18            Plan - 12/29/17 1446    Clinical Impression Statement  Pt requires direction and verbal cues for correct performance of exercises. Patient demonstrates weakness in BLE and performs open and closed chain exercises with minimal reports of pain to left knee. Pt was able to perform all exercises with max assist and VC for technique. Patient struggles with speed during movement as well as balance with unstable surfaces. Pt encouraged continuing new supine HEP .Follow-up as scheduled.    Rehab Potential  Good    PT Frequency  2x / week    PT Duration  8 weeks    PT Treatment/Interventions  Balance training;Neuromuscular re-education;Therapeutic exercise;Patient/family education;Manual techniques;Therapeutic activities;Moist Heat;Aquatic Therapy;Functional mobility training    PT Next Visit Plan  HEP progression, balance training, strengthening    PT Home Exercise Plan  heel raises, sit to stand    Consulted and Agree with Plan of Care  Patient;Family member/caregiver       Patient will benefit from skilled therapeutic intervention in order to  improve the following deficits and impairments:  Decreased balance, Decreased endurance, Abnormal gait, Decreased mobility, Difficulty walking, Dizziness, Pain, Impaired flexibility, Decreased strength, Decreased activity tolerance  Visit Diagnosis: Unsteadiness on feet  Abnormality of gait and mobility  Muscle weakness (generalized)     Problem List Patient Active Problem List   Diagnosis Date Noted  . Peripheral neuropathy 12/04/2017  . General unsteadiness 10/01/2017  . Constipation 10/01/2017  . Esophageal dysphagia 04/08/2017  . Chronic radicular pain of lower back 04/08/2017  . Fall 03/04/2017  . Encounter for chronic pain management 11/11/2016  . Breast mass, right 09/29/2016  . Abdominal aortic atherosclerosis ([place]) 09/07/2015  . Insomnia 06/08/2015  . Health maintenance examination 12/08/2014  . Advanced care planning/counseling discussion 12/08/2014  . Chronic leg pain 10/13/2014  . Fatigue 10/28/2013  . Persistent atrial fibrillation ([place]) 04/24/2013  . Medicare annual wellness visit, subsequent 04/14/2012  . Atypical chest pain 03/16/2012  . RLS (restless legs syndrome) 02/07/2012  . DDD (degenerative disc disease), lumbar 12/18/2009  . MDD (major depressive disorder), recurrent episode ([place]) 12/10/2007  . POSTHERPETIC NEURALGIA 04/20/2007  . HLD (hyperlipidemia) 11/24/2006  . PLMD (periodic limb movement disorder) 11/24/2006  . Essential hypertension 11/24/2006  . GERD 11/24/2006  . GASTRIC ULCER 11/24/2006  . Osteoarthritis 11/24/2006  . Osteoporosis with pathological fracture 11/24/2006    Alanson Puls, PT DPT 12/29/2017, 2:47 PM  [place] MAIN [facility] SERVICES [place]  [place], [place], [address] Phone: [phone]   Fax:  [phone]  Name: [name] MRN: [id number] Date of Birth: [birth year]

## 2017-12-31 ENCOUNTER — Ambulatory Visit: Payer: Medicare Other | Admitting: Physical Therapy

## 2017-12-31 ENCOUNTER — Encounter: Payer: Self-pay | Admitting: Physical Therapy

## 2017-12-31 DIAGNOSIS — M6281 Muscle weakness (generalized): Secondary | ICD-10-CM | POA: Diagnosis not present

## 2017-12-31 DIAGNOSIS — R2681 Unsteadiness on feet: Secondary | ICD-10-CM

## 2017-12-31 DIAGNOSIS — R269 Unspecified abnormalities of gait and mobility: Secondary | ICD-10-CM | POA: Diagnosis not present

## 2017-12-31 NOTE — Therapy (Signed)
[place] MAIN [facility] [place] SERVICES [address] [place], [place], [address] Phone: [phone]   Fax:  [phone]  Physical Therapy Treatment  Patient Details  Name: [name] MRN: [id number] Date of Birth: 09-15-1929 Referring Provider: Ria Bush   Encounter Date: 12/31/2017  PT End of Session - 12/31/17 1544    Visit Number  7    Number of Visits  17    Date for PT Re-Evaluation  02/01/16    Authorization Type  7/10    PT Start Time  0315    PT Stop Time  0340    PT Time Calculation (min)  25 min    Equipment Utilized During Treatment  Gait belt    Activity Tolerance  Patient tolerated treatment well;Patient limited by fatigue    Behavior During Therapy  [facility] for tasks assessed/performed       Past Medical History:  Diagnosis Date  . Abdominal aortic atherosclerosis ([place]) 09/2015   by xray  . Anxiety   . BCC (basal cell carcinoma of skin) 05/2011   on nose  . Breast lesion    a. diagnosed with complex sclerosing lesion with calcifications of the right breast in 08/2016  . Chest pain    a. nuclear stress test 03/2012: normal  . DDD (degenerative disc disease), lumbosacral 12/06/1992  . Gallstones 09/2015   incidentally by xray  . GERD (gastroesophageal reflux disease) 05/1999  . HERPES ZOSTER 04/13/2007   Qualifier: Diagnosis of  By: [name], [name]   . History of shingles    ophthalmic, takes acyclovir daily  . Hyperlipidemia 12/2003  . Hypertension 1990  . Mitral regurgitation    a. echo 03/2012: EF 60-65%, no RWMA, mild to mod AI/MR, mod TR, PASP 37 mmHg  . Osteoarthritis 08/21/1989  . Osteoporosis with fracture 11/1997   with compression fracture T12  . Persistent atrial fibrillation ([place]) 03/16/2012   a. CHADS2VASc => 5 (HTN, age x 2, vascular disease, sex category)-->Eliquis 5 BID;  b. Recurrent AF in 06/2016;  c. 01/2017 s/p DCCV;  d. 02/2017 recurrent AFib->Amio added->DCCV; e. 03/2017 recurrent  AFib, s/p reload of amio and DCCV 04/02/2017  . Rheumatoid arthritis ([place])   . RLS (restless legs syndrome)     Past Surgical History:  Procedure Laterality Date  . ABDOMINAL HYSTERECTOMY  Age 69 - 63   S/P TAH  . abdominal ultrasound  04/23/2004   Gallstones  . BREAST LUMPECTOMY WITH RADIOACTIVE SEED LOCALIZATION Right 10/2016   breast lumpectomy with radioactive seed localization and margin assessment for complex sclerosing lesion [facility])  . BREAST LUMPECTOMY WITH RADIOACTIVE SEED LOCALIZATION Right 11/05/2016   Procedure: RIGHT BREAST LUMPECTOMY WITH RADIOACTIVE SEED LOCALIZATION;  Surgeon: Fanny Skates, MD;  Location: [place];  Service: General;  Laterality: Right;  . CARDIOVERSION N/A 01/31/2017   Procedure: CARDIOVERSION;  Surgeon: Wellington [place], MD;  Location: [facility];  Service: Cardiovascular;  Laterality: N/A;  . CARDIOVERSION N/A 03/03/2017   Procedure: CARDIOVERSION;  Surgeon: Wellington [place], MD;  Location: [facility];  Service: Cardiovascular;  Laterality: N/A;  . CARDIOVERSION N/A 04/02/2017   Procedure: CARDIOVERSION;  Surgeon: Wellington [place], MD;  Location: [facility];  Service: Cardiovascular;  Laterality: N/A;  . CATARACT EXTRACTION  05/2010   left eye  . CERVICAL DISCECTOMY  1992   Fusion (Dr. Joya Salm)  . ESI Bilateral 01/2016   S1 transforaminal ESI x3  . ESOPHAGOGASTRODUODENOSCOPY  01/01/2002   with ulcer, bx.  neg; + stricture gastric ulcer with hemorrhage  . ESOPHAGOGASTRODUODENOSCOPY  04/23/2004   Esophageal stricture -- dilated  . nuclear stress test  03/2012   no ischemia  . SKIN CANCER EXCISION  05/20/2011   BCC from nose  . US ECHOCARDIOGRAPHY  03/2012   in flutter, EF 60%, mild-mod aortic, mitral, tricuspid regurg, mildly dilated LA    There were no vitals filed for this visit.   Treatment: Tapping from floor to 6 inch stool x 20 with CGA and occassional UE support  Step downs from 6 inch stool to blue air ex x 10 BLE  eccentric stepping with UE support  Matrix with 7.5 lbs x 3 reps side stepping, fwd stepping and bwd stepping with min assist and balance corrections  Patient is fatigued and needs min assist for posture and core activation                          PT Short Term Goals - 12/09/17 1728      PT SHORT TERM GOAL #1   Title  Patient will be independent in home exercise program to improve strength/mobility for better functional independence with ADLs.    Time  4    Period  Weeks    Status  New    Target Date  01/06/18      PT SHORT TERM GOAL #2   Title  Patient (< 66 years old) will complete five times sit to stand test in < 10 seconds indicating an increased LE strength and improved balance.    Time  4    Period  Weeks    Status  New    Target Date  01/06/18        PT Long Term Goals - 12/09/17 1730      PT LONG TERM GOAL #1   Title  Patient will increase Berg Balance score by > 6 points to demonstrate decreased fall risk during functional activities.    Time  8    Period  Weeks    Status  New    Target Date  02/03/18      PT LONG TERM GOAL #2   Title  Patient will increase six minute walk test distance to >1000 for progression to community ambulator and improve gait ability    Time  8    Period  Weeks    Status  New    Target Date  02/03/18      PT LONG TERM GOAL #3   Title  Patient will increase 10 meter walk test to >1.53m/s as to improve gait speed for better community ambulation and to reduce fall risk.    Time  8    Period  Weeks    Status  New    Target Date  02/03/18            Plan - 12/31/17 1549    Clinical Impression Statement  Patient instructed in beginning and intermediate LE balance in standing, supine. Patient required mod VCs to improve technique and to improve positioning for better strengthening. Patient performs open and closed chain exercises in standing and supine with fatigue after 10 reps and reports  mild pain in left  hip.   Patient   would benefit from additional skilled PT intervention to improve balance/gait safety and reduce fall risk.    Rehab Potential  Good    PT Frequency  2x / week    PT Duration  8 weeks    PT Treatment/Interventions  Balance training;Neuromuscular re-education;Therapeutic exercise;Patient/family education;Manual techniques;Therapeutic activities;Moist Heat;Aquatic Therapy;Functional mobility training    PT Next Visit Plan  HEP progression, balance training, strengthening    PT Home Exercise Plan  heel raises, sit to stand    Consulted and Agree with Plan of Care  Patient;Family member/caregiver       Patient will benefit from skilled therapeutic intervention in order to improve the following deficits and impairments:  Decreased balance, Decreased endurance, Abnormal gait, Decreased mobility, Difficulty walking, Dizziness, Pain, Impaired flexibility, Decreased strength, Decreased activity tolerance  Visit Diagnosis: Unsteadiness on feet  Abnormality of gait and mobility  Muscle weakness (generalized)     Problem List Patient Active Problem List   Diagnosis Date Noted  . Peripheral neuropathy 12/04/2017  . General unsteadiness 10/01/2017  . Constipation 10/01/2017  . Esophageal dysphagia 04/08/2017  . Chronic radicular pain of lower back 04/08/2017  . Fall 03/04/2017  . Encounter for chronic pain management 11/11/2016  . Breast mass, right 09/29/2016  . Abdominal aortic atherosclerosis ([place]) 09/07/2015  . Insomnia 06/08/2015  . Health maintenance examination 12/08/2014  . Advanced care planning/counseling discussion 12/08/2014  . Chronic leg pain 10/13/2014  . Fatigue 10/28/2013  . Persistent atrial fibrillation ([place]) 04/24/2013  . Medicare annual wellness visit, subsequent 04/14/2012  . Atypical chest pain 03/16/2012  . RLS (restless legs syndrome) 02/07/2012  . DDD (degenerative disc disease), lumbar 12/18/2009  . MDD (major depressive disorder), recurrent  episode ([place]) 12/10/2007  . POSTHERPETIC NEURALGIA 04/20/2007  . HLD (hyperlipidemia) 11/24/2006  . PLMD (periodic limb movement disorder) 11/24/2006  . Essential hypertension 11/24/2006  . GERD 11/24/2006  . GASTRIC ULCER 11/24/2006  . Osteoarthritis 11/24/2006  . Osteoporosis with pathological fracture 11/24/2006    Alanson Puls, PT DPT 12/31/2017, 3:52 PM  [place] MAIN [facility] SERVICES [address] [place], [place], [address] Phone: [phone]   Fax:  [phone]  Name: AIBHLINN KALMAR MRN: [id number] Date of Birth: 06-May-1929

## 2018-01-05 ENCOUNTER — Telehealth: Payer: Self-pay

## 2018-01-05 ENCOUNTER — Encounter: Payer: Self-pay | Admitting: Internal Medicine

## 2018-01-05 ENCOUNTER — Ambulatory Visit: Payer: Medicare Other | Admitting: Internal Medicine

## 2018-01-05 VITALS — BP 138/90 | HR 106 | Temp 97.9°F | Wt 130.0 lb

## 2018-01-05 DIAGNOSIS — Z23 Encounter for immunization: Secondary | ICD-10-CM | POA: Diagnosis not present

## 2018-01-05 DIAGNOSIS — M25473 Effusion, unspecified ankle: Secondary | ICD-10-CM | POA: Diagnosis not present

## 2018-01-05 DIAGNOSIS — M25476 Effusion, unspecified foot: Secondary | ICD-10-CM

## 2018-01-05 DIAGNOSIS — M25475 Effusion, left foot: Secondary | ICD-10-CM

## 2018-01-05 DIAGNOSIS — L03116 Cellulitis of left lower limb: Secondary | ICD-10-CM

## 2018-01-05 DIAGNOSIS — M25571 Pain in right ankle and joints of right foot: Secondary | ICD-10-CM | POA: Diagnosis not present

## 2018-01-05 DIAGNOSIS — M25572 Pain in left ankle and joints of left foot: Secondary | ICD-10-CM

## 2018-01-05 DIAGNOSIS — R0989 Other specified symptoms and signs involving the circulatory and respiratory systems: Secondary | ICD-10-CM

## 2018-01-05 MED ORDER — DOXYCYCLINE HYCLATE 100 MG PO TABS: 100.0000 mg | ORAL_TABLET | Freq: Two times a day (BID) | ORAL | 0 refills | Status: DC

## 2018-01-05 NOTE — Patient Instructions (Signed)
Ankle Exercises Ask your health care provider which exercises are safe for you. Do exercises exactly as told by your health care provider and adjust them as directed. It is normal to feel mild stretching, pulling, tightness, or discomfort as you do these exercises, but you should stop right away if you feel sudden pain or your pain gets worse. Do not begin these exercises until told by your health care provider. Stretching and range of motion exercises These exercises warm up your muscles and joints and improve the movement and flexibility of your ankle. These exercises also help to relieve pain, numbness, and tingling. Exercise A: Dorsiflexion/Plantar Flexion  1. Sit with your __________ knee straight or bent. Do not rest your foot on anything. 2. Flex your __________ ankle to tilt the top of your foot toward your shin. 3. Hold this position for __________ seconds. 4. Point your toes downward to tilt the top of your foot away from your shin. 5. Hold this position for __________ seconds. Repeat __________ times. Complete this exercise __________ times a day. Exercise B: Ankle Alphabet  1. Sit with your __________ foot supported at your lower leg. ? Do not rest your foot on anything. ? Make sure your foot has room to move freely. 2. Think of your __________ foot as a paintbrush, and move your foot to trace each letter of the alphabet in the air. Keep your hip and knee still while you trace. Make the letters as large as you can without increasing any discomfort. 3. Trace every letter from A to Z. Repeat __________ times. Complete this exercise __________ times a day. Exercise C: Ankle Dorsiflexion, Passive 1. Sit on a chair that is placed on a non-carpeted surface. 2. Place your __________ foot on the floor, directly under your __________ knee. Extend your __________ leg for support. 3. Keeping your heel down, slide your __________ foot back toward the chair until you feel a stretch at your  ankle or calf. If you do not feel a stretch, slide your buttocks forward to the edge of the chair. 4. Hold this stretch for __________ seconds. Repeat __________ times. Complete this stretch __________ times a day. Strengthening exercises These exercises build strength and endurance in your ankle. Endurance is the ability to use your muscles for a long time, even after they get tired. Exercise D: Dorsiflexors  1. Secure a rubber exercise band or tube to an object, such as a table leg, that will stay still when the band is pulled. Secure the other end around your __________ foot. 2. Sit on the floor, facing the object with your __________ leg extended. The band or tube should be slightly tense when your foot is relaxed. 3. Slowly flex your __________ ankle and toes to bring your foot toward you. 4. Hold this position for __________ seconds. 5. Slowly return your foot to the starting position, controlling the band as you do that. Repeat __________ times. Complete this exercise __________ times a day. Exercise E: Plantar Flexors  1. Sit on the floor with your __________ leg extended. 2. Loop a rubber exercise band or tube around the ball of your __________ foot. The ball of your foot is on the walking surface, right under your toes. The band or tube should be slightly tense when your foot is relaxed. 3. Slowly point your toes downward, pushing them away from you. 4. Hold this position for __________ seconds. 5. Slowly release the tension in the band or tube, controlling smoothly until your foot is  back in the starting position. Repeat __________ times. Complete this exercise __________ times a day. Exercise F: Towel Curls  1. Sit in a chair on a non-carpeted surface, and put your feet on the floor. 2. Place a towel in front of your feet. If told by your health care provider, add __________ to the end of the towel. 3. Keeping your heel on the floor, put your __________ foot on the  towel. 4. Pull the towel toward you by grabbing the towel with your toes and curling them under. Keep your heel on the floor. 5. Let your toes relax. 6. Grab the towel again. Keep going until the towel is completely underneath your foot. Repeat __________ times. Complete this exercise __________ times a day. Exercise G: Heel Raise ( Plantar Flexors, Standing) 1. Stand with your feet shoulder-width apart. 2. Keep your weight spread evenly over the width of your feet while you rise up on your toes. Use a wall or table to steady yourself, but try not to use it for support. 3. If this exercise is too easy, try these options: ? Shift your weight toward your __________ leg until you feel challenged. ? If told by your health care provider, lift your uninjured leg off the floor. 4. Hold this position for __________ seconds. Repeat __________ times. Complete this exercise __________ times a day. Exercise H: Tandem Walking 1. Stand with one foot directly in front of the other. 2. Slowly raise your back foot up, lifting your heel before your toes, and place it directly in front of your other foot. 3. Continue to walk in this heel-to-toe way for __________ or for as long as told by your health care provider. Have a countertop or wall nearby to use if needed to keep your balance, but try not to hold onto anything for support. Repeat __________ times. Complete this exercises __________ times a day. This information is not intended to replace advice given to you by your health care provider. Make sure you discuss any questions you have with your health care provider. Document Released: 02/06/2005 Document Revised: 11/23/2015 Document Reviewed: 12/11/2014 Elsevier Interactive Patient Education  2018 Elsevier Inc.  

## 2018-01-05 NOTE — Progress Notes (Signed)
Subjective:    Patient ID: [name], female    DOB: October 07, 1929, 84 y.o.   MRN: [id number]  HPI  Pt presents to the clinic today with c/o bilateral ankle swelling and redness. She noticed this 1-2 months ago. She reports the lower extremities feel warm to touch. She has some pain and tingling in her feet. She feels like the discoloration is getting darker. She has tried elevation and ice with minimal relief. Echo from 10/2016 reviewed. She has no history of CHF. She does have a history of chronic leg pain, chronic radicular leg pain, RLS and peripheral neuropathy. She has tried ice and elevation with minimal relief.   Review of Systems      Past Medical History:  Diagnosis Date  . Abdominal aortic atherosclerosis ([place]) 09/2015   by xray  . Anxiety   . BCC (basal cell carcinoma of skin) 05/2011   on nose  . Breast lesion    a. diagnosed with complex sclerosing lesion with calcifications of the right breast in 08/2016  . Chest pain    a. nuclear stress test 03/2012: normal  . DDD (degenerative disc disease), lumbosacral 12/06/1992  . Gallstones 09/2015   incidentally by xray  . GERD (gastroesophageal reflux disease) 05/1999  . HERPES ZOSTER 04/13/2007   Qualifier: Diagnosis of  By: [name], [name]   . History of shingles    ophthalmic, takes acyclovir daily  . Hyperlipidemia 12/2003  . Hypertension 1990  . Mitral regurgitation    a. echo 03/2012: EF 60-65%, no RWMA, mild to mod AI/MR, mod TR, PASP 37 mmHg  . Osteoarthritis 08/21/1989  . Osteoporosis with fracture 11/1997   with compression fracture T12  . Persistent atrial fibrillation ([place]) 03/16/2012   a. CHADS2VASc => 5 (HTN, age x 2, vascular disease, sex category)-->Eliquis 5 BID;  b. Recurrent AF in 06/2016;  c. 01/2017 s/p DCCV;  d. 02/2017 recurrent AFib->Amio added->DCCV; e. 03/2017 recurrent AFib, s/p reload of amio and DCCV 04/02/2017  . Rheumatoid arthritis ([place])   . RLS (restless legs syndrome)      Current Outpatient Medications  Medication Sig Dispense Refill  . acetaminophen (TYLENOL) 500 MG tablet Take 1,000 mg every 8 (eight) hours as needed by mouth for moderate pain.     Marland Kitchen acyclovir (ZOVIRAX) 400 MG tablet Take 400 mg 2 (two) times daily by mouth.     Marland Kitchen amiodarone (PACERONE) 200 MG tablet Take 1 tablet (200 mg total) by mouth daily. 90 tablet 3  . apixaban (ELIQUIS) 2.5 MG TABS tablet Take 1 tablet (2.5 mg total) by mouth 2 (two) times daily. 180 tablet 1  . atorvastatin (LIPITOR) 20 MG tablet TAKE ONE TABLET EVERY DAY 90 tablet 1  . calcium-vitamin D (OSCAL WITH D) 500-200 MG-UNIT per tablet Take 1 tablet by mouth daily.      . DULoxetine (CYMBALTA) 60 MG capsule Take 1 capsule (60 mg total) by mouth daily. 30 capsule 3  . gabapentin (NEURONTIN) 100 MG capsule TAKE 1 CAPSULE EVERY MORNING AND TAKE 2 CAPSULES EVERY EVENING 90 capsule 3  . pantoprazole (PROTONIX) 40 MG tablet TAKE 1 TABLET BY MOUTH DAILY 30 tablet 2  . prednisoLONE acetate (PRED FORTE) 1 % ophthalmic suspension Place 1 drop into the left eye daily.     Marland Kitchen rOPINIRole (REQUIP) 1 MG tablet TAKE TWO TABLETS EACH NIGHT AT BEDTIME 60 tablet 0  . tiZANidine (ZANAFLEX) 2 MG tablet Take 1 tablet (2 mg total) by mouth 2 (  two) times daily as needed for muscle spasms.    . traMADol (ULTRAM) 50 MG tablet Take 1 tablet (50 mg total) by mouth 2 (two) times daily.  0  . vitamin B-12 (CYANOCOBALAMIN) 1000 MCG tablet Take 1,000 mcg by mouth daily.    . Vitamin D, Ergocalciferol, (DRISDOL) 50000 units CAPS capsule TAKE 1 CAPSULE BY MOUTH ONCE WEEKLY 12 capsule 1   No current facility-administered medications for this visit.     Allergies  Allergen Reactions  . Penicillins Rash    Childhood Allergy Has patient had a PCN reaction causing immediate rash, facial/tongue/throat swelling, SOB or lightheadedness with hypotension: Yes Has patient had a PCN reaction causing severe rash involving mucus membranes or skin necrosis:  Unknown Has patient had a PCN reaction that required hospitalization: No Has patient had a PCN reaction occurring within the last 10 years: No If all of the above answers are "NO", then may proceed with Cephalosporin use.     Family History  Problem Relation Age of Onset  . Emphysema Mother   . Heart disease Father        MI  . Stroke Father        first at age 64.  . Stroke Sister   . Diabetes Sister   . Hypertension Brother   . Heart disease Brother        Heart Valve  . Stroke Brother   . Diabetes Brother   . Cancer Brother        esophageal  . Diabetes Brother   . Diabetes Brother   . Colon cancer Neg Hx     Social History   Socioeconomic History  . Marital status: Widowed    Spouse name: Not on file  . Number of children: 5  . Years of education: Not on file  . Highest education level: Not on file  Occupational History  . Occupation: Retired - Occupational psychologist: RETIRED  Social Needs  . Financial resource strain: Not on file  . Food insecurity:    Worry: Not on file    Inability: Not on file  . Transportation needs:    Medical: Not on file    Non-medical: Not on file  Tobacco Use  . Smoking status: Former Smoker    Packs/day: 0.25    Years: 1.00    Pack years: 0.25    Last attempt to quit: 04/08/1976    Years since quitting: 41.7  . Smokeless tobacco: Never Used  . Tobacco comment: pt smokes occasionally "socially"  Substance and Sexual Activity  . Alcohol use: No    Alcohol/week: 2.0 standard drinks    Types: 1 Glasses of wine, 1 Standard drinks or equivalent per week    Frequency: Never    Comment: occassional  . Drug use: No  . Sexual activity: Never  Lifestyle  . Physical activity:    Days per week: Not on file    Minutes per session: Not on file  . Stress: Not on file  Relationships  . Social connections:    Talks on phone: Not on file    Gets together: Not on file    Attends religious service: Not on file    Active member of club or  organization: Not on file    Attends meetings of clubs or organizations: Not on file    Relationship status: Not on file  . Intimate partner violence:    Fear of current or ex partner: Not on file  Emotionally abused: Not on file    Physically abused: Not on file    Forced sexual activity: Not on file  Other Topics Concern  . Not on file  Social History Narrative   Lives with grandson who smokes, dog   From [place].  Widowed 01/1999; 1st marriage 25 years / 2nd marriage 13 years.   One son deceased 16-Jan-2023.   Activity: active with dog   Diet: good water, fruits/vegetables daily     Constitutional: Denies fever, malaise, fatigue, headache or abrupt weight changes.  Respiratory: Denies difficulty breathing, shortness of breath, cough or sputum production.   Cardiovascular: Pt reports discoloration of feet and ankles. Denies chest pain, chest tightness, palpitations or swelling in the hands.  Musculoskeletal: Pt reports joint pain, swelling. Denies decrease in range of motion, difficulty with gait, muscle pain.  Skin: Pt reports wound to left lateral ankle, redness and discoloration of foot and ankles.  Neurological: Pt reports numbness, burning of BLE. Denies dizziness, difficulty with memory, difficulty with speech or problems with balance and coordination.    No other specific complaints in a complete review of systems (except as listed in HPI above).  Objective:   Physical Exam   BP 138/90   Pulse (!) 106   Temp 97.9 F (36.6 C) (Oral)   Wt 130 lb (59 kg)   SpO2 97%   BMI 24.17 kg/m  Wt Readings from Last 3 Encounters:  01/05/18 130 lb (59 kg)  12/04/17 128 lb 12 oz (58.4 kg)  10/01/17 130 lb 8 oz (59.2 kg)    General: Appears her stated age, in NAD. Skin: Puprilish hue noted of BLE. 0.5 cm scabbed wound noted over left lateral malleolus. Cardiovascular: Irregular. Unable to palpate DP or PT pulses bilaterally. Cap refill 4-5 secs. Pulmonary/Chest: Normal effort  and positive vesicular breath sounds. No respiratory distress. No wheezes, rales or ronchi noted.  Musculoskeletal: Normal flexion, extension and rotation of bilateral ankles. Mild swelling noted over the left lateral malleolus. Gait slow and steady with use of rolling walker. Neurological: Alert and oriented. Sensation decreased to BLE.   BMET    Component Value Date/Time   NA 142 03/25/2017 1231   K 3.9 03/25/2017 1231   CL 103 03/25/2017 1231   CO2 22 03/25/2017 1231   GLUCOSE 90 03/25/2017 1231   GLUCOSE 119 (H) 02/25/2017 1409   BUN 11 03/25/2017 1231   CREATININE 0.72 03/25/2017 1231   CALCIUM 8.8 05/19/2017 1406   GFRNONAA 77 03/25/2017 1231   GFRAA 88 03/25/2017 1231    Lipid Panel     Component Value Date/Time   CHOL 195 05/28/2016 1253   TRIG 107.0 05/28/2016 1253   HDL 53.70 05/28/2016 1253   CHOLHDL 4 05/28/2016 1253   VLDL 21.4 05/28/2016 1253   LDLCALC 120 (H) 05/28/2016 1253    CBC    Component Value Date/Time   WBC 5.1 03/25/2017 1231   WBC 8.2 02/25/2017 1409   RBC 4.26 03/25/2017 1231   RBC 4.44 02/25/2017 1409   HGB 13.3 03/25/2017 1231   HCT 38.4 03/25/2017 1231   PLT 268 03/25/2017 1231   MCV 90 03/25/2017 1231   MCH 31.2 03/25/2017 1231   MCH 31.3 02/25/2017 1409   MCHC 34.6 03/25/2017 1231   MCHC 33.3 02/25/2017 1409   RDW 14.6 03/25/2017 1231   LYMPHSABS 1.7 10/31/2016 1500   MONOABS 0.6 10/31/2016 1500   EOSABS 0.2 10/31/2016 1500   BASOSABS 0.1 10/31/2016 1500  Hgb A1C No results found for: HGBA1C         Assessment & Plan:   Bilateral Ankle Pain, Swelling, Discoloration:  May have a mild cellulitis secondary to wound of left lateral ankle Will cover with Doxy 100 mg BID Concern for PAD Discussed workup with ABI's but also discussed that management is with statin, blood thinners, which she is already on Doubt she would be a surgical candidate  Will follow up after ABI's, return precautions discussed Webb Silversmith,  NP

## 2018-01-05 NOTE — Telephone Encounter (Signed)
[facility] sent note by fax pt requesting appt for ankles swelling. I spoke with pt and both ankles have been swelling for few weeks but since the past weekend; pt has redness at ankles, feels warm to touch, sore and ? Some drainage or weeping from ankles.Lt ankle is worse. No swelling in lower legs. Swelling in ankles does not go down overnight. No CP or SOB. Pt scheduled 30' appt with R Baity NP 01/05/18 at 4 PM.

## 2018-01-06 ENCOUNTER — Ambulatory Visit: Payer: Medicare Other | Attending: Family Medicine | Admitting: Physical Therapy

## 2018-01-06 ENCOUNTER — Encounter: Payer: Self-pay | Admitting: Physical Therapy

## 2018-01-06 DIAGNOSIS — R269 Unspecified abnormalities of gait and mobility: Secondary | ICD-10-CM | POA: Diagnosis not present

## 2018-01-06 DIAGNOSIS — M6281 Muscle weakness (generalized): Secondary | ICD-10-CM | POA: Insufficient documentation

## 2018-01-06 DIAGNOSIS — R2681 Unsteadiness on feet: Secondary | ICD-10-CM | POA: Diagnosis not present

## 2018-01-06 NOTE — Therapy (Signed)
[place] MAIN [facility] SERVICES [address] [place], [place], [address] Phone: [phone]   Fax:  [phone]  Physical Therapy Treatment  Patient Details  Name: [name] MRN: [id number] Date of Birth: [birth year] Referring Provider (PT): Ria Bush   Encounter Date: 01/06/2018  PT End of Session - 01/06/18 1358    Visit Number  8    Number of Visits  17    Date for PT Re-Evaluation  02/01/16    Authorization Type  8/10    PT Start Time  0151    PT Stop Time  0230    PT Time Calculation (min)  39 min    Equipment Utilized During Treatment  Gait belt    Activity Tolerance  Patient tolerated treatment well;Patient limited by fatigue    Behavior During Therapy  WFL for tasks assessed/performed       Past Medical History:  Diagnosis Date  . Abdominal aortic atherosclerosis ([place]) 09/2015   by xray  . Anxiety   . BCC (basal cell carcinoma of skin) 05/2011   on nose  . Breast lesion    a. diagnosed with complex sclerosing lesion with calcifications of the right breast in 08/2016  . Chest pain    a. nuclear stress test 03/2012: normal  . DDD (degenerative disc disease), lumbosacral 12/06/1992  . Gallstones 09/2015   incidentally by xray  . GERD (gastroesophageal reflux disease) 05/1999  . HERPES ZOSTER 04/13/2007   Qualifier: Diagnosis of  By: [name], [name]   . History of shingles    ophthalmic, takes acyclovir daily  . Hyperlipidemia 12/2003  . Hypertension 1990  . Mitral regurgitation    a. echo 03/2012: EF 60-65%, no RWMA, mild to mod AI/MR, mod TR, PASP 37 mmHg  . Osteoarthritis 08/21/1989  . Osteoporosis with fracture 11/1997   with compression fracture T12  . Persistent atrial fibrillation 03/16/2012   a. CHADS2VASc => 5 (HTN, age x 2, vascular disease, sex category)-->Eliquis 5 BID;  b. Recurrent AF in 06/2016;  c. 01/2017 s/p DCCV;  d. 02/2017 recurrent AFib->Amio added->DCCV; e. 03/2017 recurrent  AFib, s/p reload of amio and DCCV 04/02/2017  . Rheumatoid arthritis ([place])   . RLS (restless legs syndrome)     Past Surgical History:  Procedure Laterality Date  . ABDOMINAL HYSTERECTOMY  Age 49 - 33   S/P TAH  . abdominal ultrasound  04/23/2004   Gallstones  . BREAST LUMPECTOMY WITH RADIOACTIVE SEED LOCALIZATION Right 10/2016   breast lumpectomy with radioactive seed localization and margin assessment for complex sclerosing lesion [facility])  . BREAST LUMPECTOMY WITH RADIOACTIVE SEED LOCALIZATION Right 11/05/2016   Procedure: RIGHT BREAST LUMPECTOMY WITH RADIOACTIVE SEED LOCALIZATION;  Surgeon: Fanny Skates, MD;  Location: [place];  Service: General;  Laterality: Right;  . CARDIOVERSION N/A 01/31/2017   Procedure: CARDIOVERSION;  Surgeon: Wellington Hampshire, MD;  Location: [facility];  Service: Cardiovascular;  Laterality: N/A;  . CARDIOVERSION N/A 03/03/2017   Procedure: CARDIOVERSION;  Surgeon: Wellington Hampshire, MD;  Location: [place] [facility];  Service: Cardiovascular;  Laterality: N/A;  . CARDIOVERSION N/A 04/02/2017   Procedure: CARDIOVERSION;  Surgeon: Wellington Hampshire, MD;  Location: [facility];  Service: Cardiovascular;  Laterality: N/A;  . CATARACT EXTRACTION  05/2010   left eye  . CERVICAL DISCECTOMY  1992   Fusion (Dr. Joya Salm)  . ESI Bilateral 01/2016   S1 transforaminal ESI x3  . ESOPHAGOGASTRODUODENOSCOPY  01/01/2002   with ulcer, bx.  neg; + stricture gastric ulcer with hemorrhage  . ESOPHAGOGASTRODUODENOSCOPY  04/23/2004   Esophageal stricture -- dilated  . nuclear stress test  03/2012   no ischemia  . SKIN CANCER EXCISION  05/20/2011   BCC from nose  . US ECHOCARDIOGRAPHY  03/2012   in flutter, EF 60%, mild-mod aortic, mitral, tricuspid regurg, mildly dilated LA    There were no vitals filed for this visit.  Subjective Assessment - 01/06/18 1356    Subjective  Patient reports that she did her exercises yesterday and her back is hurting . Her ankles  and feet are hurting 3/10 . She saw the MD yesterday.     Patient is accompained by:  Family member    Pertinent History  Patient has been having balance defictis and has had several falls in the past year.     Patient Stated Goals  "I just want it to quit hurting."    Currently in Pain?  Yes    Pain Score  3     Pain Location  Ankle    Pain Orientation  Right;Left    Pain Descriptors / Indicators  Aching    Pain Type  Acute pain    Pain Onset  More than a month ago    Aggravating Factors   walking    Pain Relieving Factors  , elevating it helps the swelling    Effect of Pain on Daily Activities  less activity       Treatment: Leg press 60 lbs x 20 x 2 heel raises and knee flex/ext x 20 x 2 Hooklying marching x 20  SAQ x 20  Hip abd sidelying x 15 x 2 with LE position corretion Supine hip abd RLE x 15 reps, needs position correction Bridging x 15   Patient is fatigued and needs min assist for posture and core activation                        PT Education - 01/06/18 1358    Education provided  Yes    Education Details  HEP    Person(s) Educated  Patient    Methods  Explanation;Demonstration    Comprehension  Verbalized understanding;Need further instruction;Returned demonstration       PT Short Term Goals - 12/09/17 1728      PT SHORT TERM GOAL #1   Title  Patient will be independent in home exercise program to improve strength/mobility for better functional independence with ADLs.    Time  4    Period  Weeks    Status  New    Target Date  01/06/18      PT SHORT TERM GOAL #2   Title  Patient (< 13 years old) will complete five times sit to stand test in < 10 seconds indicating an increased LE strength and improved balance.    Time  4    Period  Weeks    Status  New    Target Date  01/06/18        PT Long Term Goals - 12/09/17 1730      PT LONG TERM GOAL #1   Title  Patient will increase Berg Balance score by > 6 points to demonstrate  decreased fall risk during functional activities.    Time  8    Period  Weeks    Status  New    Target Date  02/03/18      PT LONG TERM GOAL #2  Title  Patient will increase six minute walk test distance to >1000 for progression to community ambulator and improve gait ability    Time  8    Period  Weeks    Status  New    Target Date  02/03/18      PT LONG TERM GOAL #3   Title  Patient will increase 10 meter walk test to >1.33m/s as to improve gait speed for better community ambulation and to reduce fall risk.    Time  8    Period  Weeks    Status  New    Target Date  02/03/18            Plan - 01/06/18 1401    Clinical Impression Statement  Instructed patient in balance and strengthening exercise. She is having pain in her feet and back today limiting her standing tolerance. Patient requires CGA to min A with beginning balance exercise. Patient requires cues for weight shift and trunk control for better balance. Patient reports increased fatigue at end of treatment session. Patient would benefit from additional skilled PT intervention to improve balance/gait safety and reduce fall risk.    Rehab Potential  Good    PT Frequency  2x / week    PT Duration  8 weeks    PT Treatment/Interventions  Balance training;Neuromuscular re-education;Therapeutic exercise;Patient/family education;Manual techniques;Therapeutic activities;Moist Heat;Aquatic Therapy;Functional mobility training    PT Next Visit Plan  HEP progression, balance training, strengthening    PT Home Exercise Plan  heel raises, sit to stand    Consulted and Agree with Plan of Care  Patient;Family member/caregiver       Patient will benefit from skilled therapeutic intervention in order to improve the following deficits and impairments:  Decreased balance, Decreased endurance, Abnormal gait, Decreased mobility, Difficulty walking, Dizziness, Pain, Impaired flexibility, Decreased strength, Decreased activity  tolerance  Visit Diagnosis: Unsteadiness on feet  Abnormality of gait and mobility  Muscle weakness (generalized)     Problem List Patient Active Problem List   Diagnosis Date Noted  . Peripheral neuropathy 12/04/2017  . General unsteadiness 10/01/2017  . Constipation 10/01/2017  . Esophageal dysphagia 04/08/2017  . Chronic radicular pain of lower back 04/08/2017  . Fall 03/04/2017  . Encounter for chronic pain management 11/11/2016  . Breast mass, right 09/29/2016  . Abdominal aortic atherosclerosis ([place]) 09/07/2015  . Insomnia 06/08/2015  . Health maintenance examination 12/08/2014  . Advanced care planning/counseling discussion 12/08/2014  . Chronic leg pain 10/13/2014  . Fatigue 10/28/2013  . Persistent atrial fibrillation 04/24/2013  . Medicare annual wellness visit, subsequent 04/14/2012  . Atypical chest pain 03/16/2012  . RLS (restless legs syndrome) 02/07/2012  . DDD (degenerative disc disease), lumbar 12/18/2009  . MDD (major depressive disorder), recurrent episode ([place]) 12/10/2007  . POSTHERPETIC NEURALGIA 04/20/2007  . HLD (hyperlipidemia) 11/24/2006  . PLMD (periodic limb movement disorder) 11/24/2006  . Essential hypertension 11/24/2006  . GERD 11/24/2006  . GASTRIC ULCER 11/24/2006  . Osteoarthritis 11/24/2006  . Osteoporosis with pathological fracture 11/24/2006    Alanson Puls, PT DPT 01/06/2018, 2:02 PM  [place] MAIN [facility] SERVICES [address] [place], [place], [address] Phone: [phone]   Fax:  [phone]  Name: [name] MRN: [id number] Date of Birth: 1930/01/08

## 2018-01-07 ENCOUNTER — Ambulatory Visit (INDEPENDENT_AMBULATORY_CARE_PROVIDER_SITE_OTHER): Payer: Medicare Other

## 2018-01-07 ENCOUNTER — Ambulatory Visit: Payer: Medicare Other

## 2018-01-07 ENCOUNTER — Ambulatory Visit (INDEPENDENT_AMBULATORY_CARE_PROVIDER_SITE_OTHER): Payer: Medicare Other | Admitting: Internal Medicine

## 2018-01-07 ENCOUNTER — Other Ambulatory Visit: Payer: Self-pay | Admitting: Internal Medicine

## 2018-01-07 ENCOUNTER — Telehealth: Payer: Self-pay | Admitting: Cardiovascular Disease

## 2018-01-07 ENCOUNTER — Ambulatory Visit: Payer: Medicare Other | Admitting: Physical Therapy

## 2018-01-07 VITALS — BP 159/110 | HR 115 | Resp 18

## 2018-01-07 DIAGNOSIS — R0989 Other specified symptoms and signs involving the circulatory and respiratory systems: Secondary | ICD-10-CM

## 2018-01-07 DIAGNOSIS — I4819 Other persistent atrial fibrillation: Secondary | ICD-10-CM

## 2018-01-07 DIAGNOSIS — M25476 Effusion, unspecified foot: Secondary | ICD-10-CM

## 2018-01-07 DIAGNOSIS — M25571 Pain in right ankle and joints of right foot: Secondary | ICD-10-CM | POA: Diagnosis not present

## 2018-01-07 DIAGNOSIS — M25473 Effusion, unspecified ankle: Secondary | ICD-10-CM | POA: Diagnosis not present

## 2018-01-07 DIAGNOSIS — M25572 Pain in left ankle and joints of left foot: Secondary | ICD-10-CM

## 2018-01-07 DIAGNOSIS — L03116 Cellulitis of left lower limb: Secondary | ICD-10-CM | POA: Diagnosis not present

## 2018-01-07 DIAGNOSIS — M25471 Effusion, right ankle: Secondary | ICD-10-CM

## 2018-01-07 DIAGNOSIS — I484 Atypical atrial flutter: Secondary | ICD-10-CM

## 2018-01-07 MED ORDER — METOPROLOL TARTRATE 25 MG PO TABS: 25.0000 mg | ORAL_TABLET | Freq: Two times a day (BID) | ORAL | 3 refills | Status: DC

## 2018-01-07 NOTE — Telephone Encounter (Signed)
Patient in office for u/s c/o swelling in feet.  Attempted to get additional information.  Patient seeing nurse now  Closing Encounter.

## 2018-01-07 NOTE — Telephone Encounter (Deleted)
Pt c/o swelling: STAT is pt has developed SOB within 24 hours  1) How much weight have you gained and in what time span? ***  2) If swelling, where is the swelling located? Feet   3) Are you currently taking a fluid pill? ***  4) Are you currently SOB? ***  5) Do you have a log of your daily weights (if so, list)? ***  6) Have you gained 3 pounds in a day or 5 pounds in a week? ***  7) Have you traveled recently? ***

## 2018-01-07 NOTE — Patient Instructions (Addendum)
Medication Instructions:  Your physician has recommended you make the following change in your medication:  1. START Metoprolol tartrate 25 mg twice a day   Labwork: Labs done today and we will call you with those results.    Follow-Up: Your physician recommends that you schedule a follow-up appointment on Monday January 12, 2018 at 1:00 pm with Dr. Fletcher Anon  It was a pleasure seeing you today here in the office. Please do not hesitate to give [place] a call back if you have any further questions. [place], BSN

## 2018-01-08 ENCOUNTER — Encounter: Payer: Self-pay | Admitting: Internal Medicine

## 2018-01-08 LAB — CBC WITH DIFFERENTIAL/PLATELET
BASOS ABS: 0.1 10*3/uL (ref 0.0–0.2)
BASOS: 1 %
EOS (ABSOLUTE): 0.1 10*3/uL (ref 0.0–0.4)
Eos: 1 %
Hematocrit: 36.2 % (ref 34.0–46.6)
Hemoglobin: 13.1 g/dL (ref 11.1–15.9)
Immature Grans (Abs): 0 10*3/uL (ref 0.0–0.1)
Immature Granulocytes: 0 %
Lymphocytes Absolute: 1.1 10*3/uL (ref 0.7–3.1)
Lymphs: 20 %
MCH: 33.8 pg — AB (ref 26.6–33.0)
MCHC: 36.2 g/dL — ABNORMAL HIGH (ref 31.5–35.7)
MCV: 93 fL (ref 79–97)
Monocytes Absolute: 0.5 10*3/uL (ref 0.1–0.9)
Monocytes: 9 %
NEUTROS ABS: 3.7 10*3/uL (ref 1.4–7.0)
NEUTROS PCT: 69 %
PLATELETS: 226 10*3/uL (ref 150–450)
RBC: 3.88 x10E6/uL (ref 3.77–5.28)
RDW: 12.7 % (ref 12.3–15.4)
WBC: 5.4 10*3/uL (ref 3.4–10.8)

## 2018-01-08 LAB — COMPREHENSIVE METABOLIC PANEL
A/G RATIO: 2.1 (ref 1.2–2.2)
ALK PHOS: 59 IU/L (ref 39–117)
ALT: 22 IU/L (ref 0–32)
AST: 18 IU/L (ref 0–40)
Albumin: 4.1 g/dL (ref 3.5–4.7)
BUN/Creatinine Ratio: 16 (ref 12–28)
BUN: 14 mg/dL (ref 8–27)
Bilirubin Total: 0.5 mg/dL (ref 0.0–1.2)
CO2: 26 mmol/L (ref 20–29)
CREATININE: 0.86 mg/dL (ref 0.57–1.00)
Calcium: 9.3 mg/dL (ref 8.7–10.3)
Chloride: 101 mmol/L (ref 96–106)
GFR calc Af Amer: 71 mL/min/{1.73_m2} (ref >59)
GFR, EST NON AFRICAN AMERICAN: 61 mL/min/{1.73_m2} (ref >59)
Globulin, Total: 2 g/dL (ref 1.5–4.5)
Glucose: 88 mg/dL (ref 65–99)
POTASSIUM: 4.4 mmol/L (ref 3.5–5.2)
SODIUM: 141 mmol/L (ref 134–144)
Total Protein: 6.1 g/dL (ref 6.0–8.5)

## 2018-01-08 LAB — TSH: TSH: 2.67 u[IU]/mL (ref 0.450–4.500)

## 2018-01-08 NOTE — Progress Notes (Signed)
Follow-up Outpatient Visit Date: 01/07/2018  Primary Care Provider: Ria Bush, MD [place] [address]  Primary Cardiologist: [name], MD.  Chief Complaint: Elevated heart rate  HPI:  [name] is a 84 y.o. year-old female with history of persistent atrial fibrillation on amiodarone and apixaban, hypertension, hyperlipidemia, and rheumatoid arthritis, who presents for urgent evaluation of elevated heart rate.  Patient was seen 2 days ago by Webb Silversmith, NP, due to bilateral ankle swelling and erythema over the last 1 to 2 months.  She was referred for a lower extremity venous duplex exam in our office today.  During that procedure, she was noted to be tachycardic.  EKG was performed by her nurse, demonstrating heart rates of 112 to 114 bpm.  [name] reports that she has been feeling relatively well other than some mild fatigue over the last couple of months.  Over the last month or 2, she is also noted mild swelling and reddish discoloration of her feet and ankles.  She denies pain in her legs as well as chest pain, shortness of breath, and palpitations.  She has had dizziness in the past, which prompted discontinuation of metoprolol at her last visit with Dr. Fletcher Anon in May.  She does not note any significant dizziness.  --------------------------------------------------------------------------------------------------  Past Medical History:  Diagnosis Date  . Abdominal aortic atherosclerosis ([place]) 09/2015   by xray  . Anxiety   . BCC (basal cell carcinoma of skin) 05/2011   on nose  . Breast lesion    a. diagnosed with complex sclerosing lesion with calcifications of the right breast in 08/2016  . Chest pain    a. nuclear stress test 03/2012: normal  . DDD (degenerative disc disease), lumbosacral 12/06/1992  . Gallstones 09/2015   incidentally by xray  . GERD (gastroesophageal reflux disease) 05/1999  . HERPES ZOSTER 04/13/2007   Qualifier:  Diagnosis of  By: [name], [name]   . History of shingles    ophthalmic, takes acyclovir daily  . Hyperlipidemia 12/2003  . Hypertension 1990  . Mitral regurgitation    a. echo 03/2012: EF 60-65%, no RWMA, mild to mod AI/MR, mod TR, PASP 37 mmHg  . Osteoarthritis 08/21/1989  . Osteoporosis with fracture 11/1997   with compression fracture T12  . Persistent atrial fibrillation 03/16/2012   a. CHADS2VASc => 5 (HTN, age x 2, vascular disease, sex category)-->Eliquis 5 BID;  b. Recurrent AF in 06/2016;  c. 01/2017 s/p DCCV;  d. 02/2017 recurrent AFib->Amio added->DCCV; e. 03/2017 recurrent AFib, s/p reload of amio and DCCV 04/02/2017  . Rheumatoid arthritis ([place])   . RLS (restless legs syndrome)    Past Surgical History:  Procedure Laterality Date  . ABDOMINAL HYSTERECTOMY  Age 89 - 66   S/P TAH  . abdominal ultrasound  04/23/2004   Gallstones  . BREAST LUMPECTOMY WITH RADIOACTIVE SEED LOCALIZATION Right 10/2016   breast lumpectomy with radioactive seed localization and margin assessment for complex sclerosing lesion [facility])  . BREAST LUMPECTOMY WITH RADIOACTIVE SEED LOCALIZATION Right 11/05/2016   Procedure: RIGHT BREAST LUMPECTOMY WITH RADIOACTIVE SEED LOCALIZATION;  Surgeon: Fanny Skates, MD;  Location: [place];  Service: General;  Laterality: Right;  . CARDIOVERSION N/A 01/31/2017   Procedure: CARDIOVERSION;  Surgeon: Wellington Hampshire, MD;  Location: [facility];  Service: Cardiovascular;  Laterality: N/A;  . CARDIOVERSION N/A 03/03/2017   Procedure: CARDIOVERSION;  Surgeon: Wellington Hampshire, MD;  Location: [facility];  Service: Cardiovascular;  Laterality: N/A;  . CARDIOVERSION N/A 04/02/2017   Procedure: CARDIOVERSION;  Surgeon: Wellington Hampshire, MD;  Location: [facility];  Service: Cardiovascular;  Laterality: N/A;  . CATARACT EXTRACTION  05/2010   left eye  . CERVICAL DISCECTOMY  1992   Fusion (Dr. Joya Salm)  . ESI Bilateral 01/2016   S1  transforaminal ESI x3  . ESOPHAGOGASTRODUODENOSCOPY  01/01/2002   with ulcer, bx. neg; + stricture gastric ulcer with hemorrhage  . ESOPHAGOGASTRODUODENOSCOPY  04/23/2004   Esophageal stricture -- dilated  . nuclear stress test  03/2012   no ischemia  . SKIN CANCER EXCISION  05/20/2011   BCC from nose  . US ECHOCARDIOGRAPHY  03/2012   in flutter, EF 60%, mild-mod aortic, mitral, tricuspid regurg, mildly dilated LA    Current Meds  Medication Sig  . acetaminophen (TYLENOL) 500 MG tablet Take 1,000 mg every 8 (eight) hours as needed by mouth for moderate pain.   Marland Kitchen acyclovir (ZOVIRAX) 400 MG tablet Take 400 mg 2 (two) times daily by mouth.   Marland Kitchen amiodarone (PACERONE) 200 MG tablet Take 1 tablet (200 mg total) by mouth daily.  Marland Kitchen apixaban (ELIQUIS) 2.5 MG TABS tablet Take 1 tablet (2.5 mg total) by mouth 2 (two) times daily.  Marland Kitchen atorvastatin (LIPITOR) 20 MG tablet TAKE ONE TABLET EVERY DAY  . calcium-vitamin D (OSCAL WITH D) 500-200 MG-UNIT per tablet Take 1 tablet by mouth daily.    Marland Kitchen doxycycline (VIBRA-TABS) 100 MG tablet Take 1 tablet (100 mg total) by mouth 2 (two) times daily.  . DULoxetine (CYMBALTA) 60 MG capsule Take 1 capsule (60 mg total) by mouth daily.  Marland Kitchen gabapentin (NEURONTIN) 100 MG capsule TAKE 1 CAPSULE EVERY MORNING AND TAKE 2 CAPSULES EVERY EVENING  . metoprolol tartrate (LOPRESSOR) 25 MG tablet Take 1 tablet (25 mg total) by mouth 2 (two) times daily.  . pantoprazole (PROTONIX) 40 MG tablet TAKE 1 TABLET BY MOUTH DAILY  . prednisoLONE acetate (PRED FORTE) 1 % ophthalmic suspension Place 1 drop into the left eye daily.   Marland Kitchen rOPINIRole (REQUIP) 1 MG tablet TAKE TWO TABLETS EACH NIGHT AT BEDTIME  . tiZANidine (ZANAFLEX) 2 MG tablet Take 2 mg by mouth once.   . traMADol (ULTRAM) 50 MG tablet Take 1 tablet (50 mg total) by mouth 2 (two) times daily.  . vitamin B-12 (CYANOCOBALAMIN) 1000 MCG tablet Take 1,000 mcg by mouth daily.  . Vitamin D, Ergocalciferol, (DRISDOL) 50000 units  CAPS capsule TAKE 1 CAPSULE BY MOUTH ONCE WEEKLY    Allergies: Penicillins  Social History   Tobacco Use  . Smoking status: Former Smoker    Packs/day: 0.25    Years: 1.00    Pack years: 0.25    Last attempt to quit: 04/08/1976    Years since quitting: 41.7  . Smokeless tobacco: Never Used  . Tobacco comment: pt smokes occasionally "socially"  Substance Use Topics  . Alcohol use: No    Alcohol/week: 2.0 standard drinks    Types: 1 Glasses of wine, 1 Standard drinks or equivalent per week    Frequency: Never    Comment: occassional  . Drug use: No    Family History  Problem Relation Age of Onset  . Emphysema Mother   . Heart disease Father        MI  . Stroke Father        first at age 63.  . Stroke Sister   . Diabetes Sister   . Hypertension Brother   . Heart  disease Brother        Heart Valve  . Stroke Brother   . Diabetes Brother   . Cancer Brother        esophageal  . Diabetes Brother   . Diabetes Brother   . Colon cancer Neg Hx     Review of Systems: A 12-system review of systems was performed and was negative except as noted in the HPI.  --------------------------------------------------------------------------------------------------  Physical Exam: BP (!) 159/110 (BP Location: Left Arm, Patient Position: Supine, Cuff Size: Normal)   Pulse (!) 115   Resp 18   SpO2 97%   General: NAD.  Accompanied by her daughter. HEENT: No conjunctival pallor or scleral icterus. Moist mucous membranes.  OP clear. Neck: Supple without lymphadenopathy, thyromegaly, JVD, or HJR. No carotid bruit. Lungs: Normal work of breathing. Clear to auscultation bilaterally without wheezes or crackles. Heart: Tachycardic but regular without murmurs.  Nondisplaced PMI. Abd: Bowel sounds present. Soft, NT/ND without hepatosplenomegaly Ext: Trace pretibial edema bilaterally.   Skin: Erythema of both ankles noted.  EKG: Atypical atrial flutter versus atrial tachycardia with 2: 1  conduction.  Otherwise, no significant abnormalities.  Lab Results  Component Value Date   WBC 5.4 01/07/2018   HGB 13.1 01/07/2018   HCT 36.2 01/07/2018   MCV 93 01/07/2018   PLT 226 01/07/2018    Lab Results  Component Value Date   NA 141 01/07/2018   K 4.4 01/07/2018   CL 101 01/07/2018   CO2 26 01/07/2018   BUN 14 01/07/2018   CREATININE 0.86 01/07/2018   GLUCOSE 88 01/07/2018   ALT 22 01/07/2018    Lab Results  Component Value Date   CHOL 195 05/28/2016   HDL 53.70 05/28/2016   LDLCALC 120 (H) 05/28/2016   LDLDIRECT 173.1 02/19/2010   TRIG 107.0 05/28/2016   CHOLHDL 4 05/28/2016    --------------------------------------------------------------------------------------------------  ASSESSMENT AND PLAN: Atrial flutter Patient has a history of persistent atrial fibrillation but has maintained sinus rhythm following cardioversion on amiodarone.  EKG today is most suggestive of atypical atrial flutter with 2: 1 conduction, though atrial tachycardia is also a consideration.  She is minimally symptomatic at this time.  She is hypertensive as well.  I will continue amiodarone and apixaban.  We have agreed to start metoprolol tartrate 25 mg twice daily.  I will check a CBC, CMP, and TSH today.  Patient will return early next week for reevaluation.  If she remains in this arrhythmia, she may need to be set up for cardioversion.  I asked her to contact us if she has any significant rise or fall and her heart rate or becomes symptomatic.  Follow-up: Return to clinic next week (01/12/2018) for repeat EKG and assessment by Dr. Fletcher Anon.  Nelva Bush, MD 01/08/2018 6:53 AM

## 2018-01-09 NOTE — Addendum Note (Signed)
Addended by: Alvis Lemmings C on: 01/09/2018 03:38 PM   Modules accepted: Orders

## 2018-01-12 ENCOUNTER — Encounter: Payer: Self-pay | Admitting: Cardiovascular Disease

## 2018-01-12 ENCOUNTER — Ambulatory Visit: Payer: Medicare Other | Admitting: Physical Therapy

## 2018-01-12 ENCOUNTER — Ambulatory Visit: Payer: Medicare Other | Admitting: Cardiovascular Disease

## 2018-01-12 VITALS — BP 130/80 | HR 109 | Ht 62.0 in | Wt 128.5 lb

## 2018-01-12 DIAGNOSIS — I4819 Other persistent atrial fibrillation: Secondary | ICD-10-CM | POA: Diagnosis not present

## 2018-01-12 DIAGNOSIS — E785 Hyperlipidemia, unspecified: Secondary | ICD-10-CM

## 2018-01-12 DIAGNOSIS — I10 Essential (primary) hypertension: Secondary | ICD-10-CM | POA: Diagnosis not present

## 2018-01-12 DIAGNOSIS — I484 Atypical atrial flutter: Secondary | ICD-10-CM | POA: Diagnosis not present

## 2018-01-12 NOTE — Patient Instructions (Addendum)
Medication Instructions: - Your physician recommends that you continue on your current medications as directed. Please refer to the Current Medication list given to you today.  Labwork: - none ordered  Procedures/Testing: - Your physician has recommended that you have a Cardioversion (DCCV). Electrical Cardioversion uses a jolt of electricity to your heart either through paddles or wired patches attached to your chest. This is a controlled, usually prescheduled, procedure. Defibrillation is done under light anesthesia in the hospital, and you usually go home the day of the procedure. This is done to get your heart back into a normal rhythm. You are not awake for the procedure.   You are scheduled for a Cardioversion on Friday 01/16/18 with Dr. Fletcher Anon.  Please arrive at the [place] [facility] at 6:30 a.m. on the day of your procedure. Please go to the 1st desk on the right to check in.  DIET INSTRUCTIONS:  Nothing to eat or drink after midnight the night prior to your procedure.         1) Labs: N/A  2) Medications:  You may take all of your regular medications the morning of your procedure with enough water to get them down safely unless listed below:       -  HOLD METOPROLOL THE MORNING OF YOUR PROCEDURE  3) Must have a responsible person to drive you home.  4) Bring a current list of your medications and current insurance cards.    If you have any questions after you get home, please call the office at [phone]   Follow-Up: - Your physician recommends that you schedule a follow-up appointment in: 1 month with Dr. Fletcher Anon APP   Any Additional Special Instructions Will Be Listed Below (If Applicable).     If you need a refill on your cardiac medications before your next appointment, please call your pharmacy.    Electrical Cardioversion Electrical cardioversion is the delivery of a jolt of electricity to restore a normal rhythm to the heart. A rhythm that is too fast or  is not regular keeps the heart from pumping well. In this procedure, sticky patches or metal paddles are placed on the chest to deliver electricity to the heart from a device. This procedure may be done in an emergency if:  There is low or no blood pressure as a result of the heart rhythm.  Normal rhythm must be restored as fast as possible to protect the brain and heart from further damage.  It may save a life.  This procedure may also be done for irregular or fast heart rhythms that are not immediately life-threatening. Tell a health care provider about:  Any allergies you have.  All medicines you are taking, including vitamins, herbs, eye drops, creams, and over-the-counter medicines.  Any problems you or family members have had with anesthetic medicines.  Any blood disorders you have.  Any surgeries you have had.  Any medical conditions you have.  Whether you are pregnant or may be pregnant. What are the risks? Generally, this is a safe procedure. However, problems may occur, including:  Allergic reactions to medicines.  A blood clot that breaks free and travels to other parts of your body.  The possible return of an abnormal heart rhythm within hours or days after the procedure.  Your heart stopping (cardiac arrest). This is rare.  What happens before the procedure? Medicines  Your health care provider may have you start taking: ? Blood-thinning medicines (anticoagulants) so your blood does not  clot as easily. ? Medicines may be given to help stabilize your heart rate and rhythm.  Ask your health care provider about changing or stopping your regular medicines. This is especially important if you are taking diabetes medicines or blood thinners. General instructions  Plan to have someone take you home from the hospital or clinic.  If you will be going home right after the procedure, plan to have someone with you for 24 hours.  Follow instructions from your health  care provider about eating or drinking restrictions. What happens during the procedure?  To lower your risk of infection: ? Your health care team will wash or sanitize their hands. ? Your skin will be washed with soap.  An IV tube will be inserted into one of your veins.  You will be given a medicine to help you relax (sedative).  Sticky patches (electrodes) or metal paddles may be placed on your chest.  An electrical shock will be delivered. The procedure may vary among health care providers and hospitals. What happens after the procedure?  Your blood pressure, heart rate, breathing rate, and blood oxygen level will be monitored until the medicines you were given have worn off.  Do not drive for 24 hours if you were given a sedative.  Your heart rhythm will be watched to make sure it does not change. This information is not intended to replace advice given to you by your health care provider. Make sure you discuss any questions you have with your health care provider. Document Released: 03/15/2002 Document Revised: 11/22/2015 Document Reviewed: 09/29/2015 Elsevier Interactive Patient Education  2017 [employer].

## 2018-01-12 NOTE — H&P (View-Only) (Signed)
Cardiology Office Note   Date:  01/12/2018   ID:  [name], DOB 04/15/1929, MRN [id number]  PCP:  Ria Bush, MD  Cardiologist:   [name], MD   Chief Complaint  Patient presents with  . other    Follow up form LE vascualr u/s. Meds reviewed by the pt.'s daughter. Pt. c/o shortness of breath.       History of Present Illness: [name] is a 84 y.o. female who presents for a follow-up visit regarding persistent atrial fibrillation  And recently diagnosed atrial flutter. Nuclear stress test in 03/2012 showed no evidence of ischemia.  Echocardiogram in August 2018 showed normal LV systolic function, mild mitral regurgitation and mildly dilated left atrium. Most recent cardioversion was in December 2018 after loading with amiodarone.   Metoprolol was discontinued due to bradycardia. She has been dealing with an ulceration on the lateral side of the left ankle and came last week to our office for lower extremity arterial Doppler.  This showed normal ABI.  However, she was noted to be tachycardic.  EKG was done and showed atypical atrial flutter.  She was seen by Dr. Saunders Revel and metoprolol was resumed at 25 mg twice daily.  The patient has been complaining of fatigue and palpitations.  No chest pain.  She is here for follow-up and continues to be in atypical atrial flutter.  She is not as tachycardic. I reviewed her recent labs which showed normal thyroid function, CBC and CMP.  Past Medical History:  Diagnosis Date  . Abdominal aortic atherosclerosis ([place]) 09/2015   by xray  . Anxiety   . BCC (basal cell carcinoma of skin) 05/2011   on nose  . Breast lesion    a. diagnosed with complex sclerosing lesion with calcifications of the right breast in 08/2016  . Chest pain    a. nuclear stress test 03/2012: normal  . DDD (degenerative disc disease), lumbosacral 12/06/1992  . Gallstones 09/2015   incidentally by xray  . GERD (gastroesophageal reflux disease) 05/1999    . HERPES ZOSTER 04/13/2007   Qualifier: Diagnosis of  By: [name], [name]   . History of shingles    ophthalmic, takes acyclovir daily  . Hyperlipidemia 12/2003  . Hypertension 1990  . Mitral regurgitation    a. echo 03/2012: EF 60-65%, no RWMA, mild to mod AI/MR, mod TR, PASP 37 mmHg  . Osteoarthritis 08/21/1989  . Osteoporosis with fracture 11/1997   with compression fracture T12  . Persistent atrial fibrillation 03/16/2012   a. CHADS2VASc => 5 (HTN, age x 2, vascular disease, sex category)-->Eliquis 5 BID;  b. Recurrent AF in 06/2016;  c. 01/2017 s/p DCCV;  d. 02/2017 recurrent AFib->Amio added->DCCV; e. 03/2017 recurrent AFib, s/p reload of amio and DCCV 04/02/2017  . Rheumatoid arthritis ([place])   . RLS (restless legs syndrome)     Past Surgical History:  Procedure Laterality Date  . ABDOMINAL HYSTERECTOMY  Age 54 - 65   S/P TAH  . abdominal ultrasound  04/23/2004   Gallstones  . BREAST LUMPECTOMY WITH RADIOACTIVE SEED LOCALIZATION Right 10/2016   breast lumpectomy with radioactive seed localization and margin assessment for complex sclerosing lesion [facility])  . BREAST LUMPECTOMY WITH RADIOACTIVE SEED LOCALIZATION Right 11/05/2016   Procedure: RIGHT BREAST LUMPECTOMY WITH RADIOACTIVE SEED LOCALIZATION;  Surgeon: Fanny Skates, MD;  Location: [place];  Service: General;  Laterality: Right;  . CARDIOVERSION N/A 01/31/2017   Procedure: CARDIOVERSION;  Surgeon: [name]  A, MD;  Location: [facility];  Service: Cardiovascular;  Laterality: N/A;  . CARDIOVERSION N/A 03/03/2017   Procedure: CARDIOVERSION;  Surgeon: Wellington Hampshire, MD;  Location: [facility];  Service: Cardiovascular;  Laterality: N/A;  . CARDIOVERSION N/A 04/02/2017   Procedure: CARDIOVERSION;  Surgeon: Wellington Hampshire, MD;  Location: [facility];  Service: Cardiovascular;  Laterality: N/A;  . CATARACT EXTRACTION  05/2010   left eye  . CERVICAL DISCECTOMY  1992   Fusion (Dr.  Joya Salm)  . ESI Bilateral 01/2016   S1 transforaminal ESI x3  . ESOPHAGOGASTRODUODENOSCOPY  01/01/2002   with ulcer, bx. neg; + stricture gastric ulcer with hemorrhage  . ESOPHAGOGASTRODUODENOSCOPY  04/23/2004   Esophageal stricture -- dilated  . nuclear stress test  03/2012   no ischemia  . SKIN CANCER EXCISION  05/20/2011   BCC from nose  . US ECHOCARDIOGRAPHY  03/2012   in flutter, EF 60%, mild-mod aortic, mitral, tricuspid regurg, mildly dilated LA     Current Outpatient Medications  Medication Sig Dispense Refill  . acetaminophen (TYLENOL) 500 MG tablet Take 1,000 mg every 8 (eight) hours as needed by mouth for moderate pain.     Marland Kitchen acyclovir (ZOVIRAX) 400 MG tablet Take 400 mg 2 (two) times daily by mouth.     Marland Kitchen amiodarone (PACERONE) 200 MG tablet Take 1 tablet (200 mg total) by mouth daily. 90 tablet 3  . apixaban (ELIQUIS) 2.5 MG TABS tablet Take 1 tablet (2.5 mg total) by mouth 2 (two) times daily. 180 tablet 1  . atorvastatin (LIPITOR) 20 MG tablet TAKE ONE TABLET EVERY DAY 90 tablet 1  . doxycycline (VIBRA-TABS) 100 MG tablet Take 1 tablet (100 mg total) by mouth 2 (two) times daily. 20 tablet 0  . DULoxetine (CYMBALTA) 60 MG capsule Take 1 capsule (60 mg total) by mouth daily. 30 capsule 3  . gabapentin (NEURONTIN) 100 MG capsule TAKE 1 CAPSULE EVERY MORNING AND TAKE 2 CAPSULES EVERY EVENING 90 capsule 3  . metoprolol tartrate (LOPRESSOR) 25 MG tablet Take 1 tablet (25 mg total) by mouth 2 (two) times daily. 180 tablet 3  . pantoprazole (PROTONIX) 40 MG tablet TAKE 1 TABLET BY MOUTH DAILY 30 tablet 2  . prednisoLONE acetate (PRED FORTE) 1 % ophthalmic suspension Place 1 drop into the left eye daily.     Marland Kitchen rOPINIRole (REQUIP) 1 MG tablet TAKE TWO TABLETS EACH NIGHT AT BEDTIME 60 tablet 0  . tiZANidine (ZANAFLEX) 2 MG tablet Take 2 mg by mouth once.     . traMADol (ULTRAM) 50 MG tablet Take 1 tablet (50 mg total) by mouth 2 (two) times daily.  0  . Vitamin D, Ergocalciferol,  (DRISDOL) 50000 units CAPS capsule TAKE 1 CAPSULE BY MOUTH ONCE WEEKLY 12 capsule 1   No current facility-administered medications for this visit.     Allergies:   Penicillins    Social History:  The patient  reports that she quit smoking about 41 years ago. She has a 0.25 pack-year smoking history. She has never used smokeless tobacco. She reports that she does not drink alcohol or use drugs.   Family History:  The patient's family history includes Cancer in her brother; Diabetes in her brother, brother, brother, and sister; Emphysema in her mother; Heart disease in her brother and father; Hypertension in her brother; Stroke in her brother, father, and sister.    ROS:  Please see the history of present illness.   Otherwise, review of systems are positive for none.  All other systems are reviewed and negative.    PHYSICAL EXAM: VS:  BP 130/80 (BP Location: Left Arm, Patient Position: Sitting, Cuff Size: Normal)   Pulse (!) 109   Ht 5\' 2"  (1.575 m)   Wt 128 lb 8 oz (58.3 kg)   BMI 23.50 kg/m  , BMI Body mass index is 23.5 kg/m. GEN: Well nourished, well developed, in no acute distress  HEENT: normal  Neck: no JVD, carotid bruits, or masses Cardiac: Regular but tachycardic,no murmurs, rubs, or gallops,no edema  Respiratory:  clear to auscultation bilaterally, normal work of breathing GI: soft, nontender, nondistended, + BS MS: no deformity or atrophy  Skin: warm and dry, no rash Neuro:  Strength and sensation are intact Psych: euthymic mood, full affect  EKG:  EKG is ordered today. The ekg ordered today demonstrates atypical atrial flutter with a heart rate of 109 bpm.   Recent Labs: 03/25/2017: Magnesium 2.0 01/07/2018: ALT 22; BUN 14; Creatinine, Ser 0.86; Hemoglobin 13.1; Platelets 226; Potassium 4.4; Sodium 141; TSH 2.670    Lipid Panel    Component Value Date/Time   CHOL 195 05/28/2016 1253   TRIG 107.0 05/28/2016 1253   HDL 53.70 05/28/2016 1253   CHOLHDL 4  05/28/2016 1253   VLDL 21.4 05/28/2016 1253   LDLCALC 120 (H) 05/28/2016 1253   LDLDIRECT 173.1 02/19/2010 0908      Wt Readings from Last 3 Encounters:  01/12/18 128 lb 8 oz (58.3 kg)  01/05/18 130 lb (59 kg)  12/04/17 128 lb 12 oz (58.4 kg)       No flowsheet data found.    ASSESSMENT AND PLAN:  1.  Atypical atrial flutter with rapid ventricular response: History of persistent atrial fibrillation with previous cardioversion and maintaining sinus rhythm with amiodarone.  She is now in atypical atrial flutter and ventricular rate is difficult to control.  She is symptomatic.  She is already anticoagulated with Eliquis and has not missed any dose. I think the best option is to proceed with cardioversion this week.  I asked her to hold metoprolol the morning of cardioversion.   2. Essential hypertension: Blood pressure is reasonably controlled.  3. Hyperlipidemia: Currently on atorvastatin 20 mg once daily with most recent LDL of 75.    Disposition:   FU with me in 1 month  Signed,  [name], MD  01/12/2018 2:01 PM    [facility]

## 2018-01-12 NOTE — Progress Notes (Signed)
Cardiology Office Note   Date:  01/12/2018   ID:  [name], DOB June 23, 1929, MRN [id number]  PCP:  Ria Bush, MD  Cardiologist:   [name], MD   Chief Complaint  Patient presents with  . other    Follow up form LE vascualr u/s. Meds reviewed by the pt.'s daughter. Pt. c/o shortness of breath.       History of Present Illness: [name] is a 84 y.o. female who presents for a follow-up visit regarding persistent atrial fibrillation  And recently diagnosed atrial flutter. Nuclear stress test in 03/2012 showed no evidence of ischemia.  Echocardiogram in August 2018 showed normal LV systolic function, mild mitral regurgitation and mildly dilated left atrium. Most recent cardioversion was in December 2018 after loading with amiodarone.   Metoprolol was discontinued due to bradycardia. She has been dealing with an ulceration on the lateral side of the left ankle and came last week to our office for lower extremity arterial Doppler.  This showed normal ABI.  However, she was noted to be tachycardic.  EKG was done and showed atypical atrial flutter.  She was seen by Dr. Saunders Revel and metoprolol was resumed at 25 mg twice daily.  The patient has been complaining of fatigue and palpitations.  No chest pain.  She is here for follow-up and continues to be in atypical atrial flutter.  She is not as tachycardic. I reviewed her recent labs which showed normal thyroid function, CBC and CMP.  Past Medical History:  Diagnosis Date  . Abdominal aortic atherosclerosis ([place]) 09/2015   by xray  . Anxiety   . BCC (basal cell carcinoma of skin) 05/2011   on nose  . Breast lesion    a. diagnosed with complex sclerosing lesion with calcifications of the right breast in 08/2016  . Chest pain    a. nuclear stress test 03/2012: normal  . DDD (degenerative disc disease), lumbosacral 12/06/1992  . Gallstones 09/2015   incidentally by xray  . GERD (gastroesophageal reflux disease) 05/1999    . HERPES ZOSTER 04/13/2007   Qualifier: Diagnosis of  By: [name], [name]   . History of shingles    ophthalmic, takes acyclovir daily  . Hyperlipidemia 12/2003  . Hypertension 1990  . Mitral regurgitation    a. echo 03/2012: EF 60-65%, no RWMA, mild to mod AI/MR, mod TR, PASP 37 mmHg  . Osteoarthritis 08/21/1989  . Osteoporosis with fracture 11/1997   with compression fracture T12  . Persistent atrial fibrillation 03/16/2012   a. CHADS2VASc => 5 (HTN, age x 2, vascular disease, sex category)-->Eliquis 5 BID;  b. Recurrent AF in 06/2016;  c. 01/2017 s/p DCCV;  d. 02/2017 recurrent AFib->Amio added->DCCV; e. 03/2017 recurrent AFib, s/p reload of amio and DCCV 04/02/2017  . Rheumatoid arthritis ([place])   . RLS (restless legs syndrome)     Past Surgical History:  Procedure Laterality Date  . ABDOMINAL HYSTERECTOMY  Age 54 - 38   S/P TAH  . abdominal ultrasound  04/23/2004   Gallstones  . BREAST LUMPECTOMY WITH RADIOACTIVE SEED LOCALIZATION Right 10/2016   breast lumpectomy with radioactive seed localization and margin assessment for complex sclerosing lesion [facility])  . BREAST LUMPECTOMY WITH RADIOACTIVE SEED LOCALIZATION Right 11/05/2016   Procedure: RIGHT BREAST LUMPECTOMY WITH RADIOACTIVE SEED LOCALIZATION;  Surgeon: Fanny Skates, MD;  Location: [place];  Service: General;  Laterality: Right;  . CARDIOVERSION N/A 01/31/2017   Procedure: CARDIOVERSION;  Surgeon: [name]  A, MD;  Location: [facility];  Service: Cardiovascular;  Laterality: N/A;  . CARDIOVERSION N/A 03/03/2017   Procedure: CARDIOVERSION;  Surgeon: Wellington Hampshire, MD;  Location: [facility];  Service: Cardiovascular;  Laterality: N/A;  . CARDIOVERSION N/A 04/02/2017   Procedure: CARDIOVERSION;  Surgeon: Wellington Hampshire, MD;  Location: [facility];  Service: Cardiovascular;  Laterality: N/A;  . CATARACT EXTRACTION  05/2010   left eye  . CERVICAL DISCECTOMY  1992   Fusion (Dr.  Joya Salm)  . ESI Bilateral 01/2016   S1 transforaminal ESI x3  . ESOPHAGOGASTRODUODENOSCOPY  01/01/2002   with ulcer, bx. neg; + stricture gastric ulcer with hemorrhage  . ESOPHAGOGASTRODUODENOSCOPY  04/23/2004   Esophageal stricture -- dilated  . nuclear stress test  03/2012   no ischemia  . SKIN CANCER EXCISION  05/20/2011   BCC from nose  . US ECHOCARDIOGRAPHY  03/2012   in flutter, EF 60%, mild-mod aortic, mitral, tricuspid regurg, mildly dilated LA     Current Outpatient Medications  Medication Sig Dispense Refill  . acetaminophen (TYLENOL) 500 MG tablet Take 1,000 mg every 8 (eight) hours as needed by mouth for moderate pain.     Marland Kitchen acyclovir (ZOVIRAX) 400 MG tablet Take 400 mg 2 (two) times daily by mouth.     Marland Kitchen amiodarone (PACERONE) 200 MG tablet Take 1 tablet (200 mg total) by mouth daily. 90 tablet 3  . apixaban (ELIQUIS) 2.5 MG TABS tablet Take 1 tablet (2.5 mg total) by mouth 2 (two) times daily. 180 tablet 1  . atorvastatin (LIPITOR) 20 MG tablet TAKE ONE TABLET EVERY DAY 90 tablet 1  . doxycycline (VIBRA-TABS) 100 MG tablet Take 1 tablet (100 mg total) by mouth 2 (two) times daily. 20 tablet 0  . DULoxetine (CYMBALTA) 60 MG capsule Take 1 capsule (60 mg total) by mouth daily. 30 capsule 3  . gabapentin (NEURONTIN) 100 MG capsule TAKE 1 CAPSULE EVERY MORNING AND TAKE 2 CAPSULES EVERY EVENING 90 capsule 3  . metoprolol tartrate (LOPRESSOR) 25 MG tablet Take 1 tablet (25 mg total) by mouth 2 (two) times daily. 180 tablet 3  . pantoprazole (PROTONIX) 40 MG tablet TAKE 1 TABLET BY MOUTH DAILY 30 tablet 2  . prednisoLONE acetate (PRED FORTE) 1 % ophthalmic suspension Place 1 drop into the left eye daily.     Marland Kitchen rOPINIRole (REQUIP) 1 MG tablet TAKE TWO TABLETS EACH NIGHT AT BEDTIME 60 tablet 0  . tiZANidine (ZANAFLEX) 2 MG tablet Take 2 mg by mouth once.     . traMADol (ULTRAM) 50 MG tablet Take 1 tablet (50 mg total) by mouth 2 (two) times daily.  0  . Vitamin D, Ergocalciferol,  (DRISDOL) 50000 units CAPS capsule TAKE 1 CAPSULE BY MOUTH ONCE WEEKLY 12 capsule 1   No current facility-administered medications for this visit.     Allergies:   Penicillins    Social History:  The patient  reports that she quit smoking about 41 years ago. She has a 0.25 pack-year smoking history. She has never used smokeless tobacco. She reports that she does not drink alcohol or use drugs.   Family History:  The patient's family history includes Cancer in her brother; Diabetes in her brother, brother, brother, and sister; Emphysema in her mother; Heart disease in her brother and father; Hypertension in her brother; Stroke in her brother, father, and sister.    ROS:  Please see the history of present illness.   Otherwise, review of systems are positive for none.  All other systems are reviewed and negative.    PHYSICAL EXAM: VS:  BP 130/80 (BP Location: Left Arm, Patient Position: Sitting, Cuff Size: Normal)   Pulse (!) 109   Ht 5\' 2"  (1.575 m)   Wt 128 lb 8 oz (58.3 kg)   BMI 23.50 kg/m  , BMI Body mass index is 23.5 kg/m. GEN: Well nourished, well developed, in no acute distress  HEENT: normal  Neck: no JVD, carotid bruits, or masses Cardiac: Regular but tachycardic,no murmurs, rubs, or gallops,no edema  Respiratory:  clear to auscultation bilaterally, normal work of breathing GI: soft, nontender, nondistended, + BS MS: no deformity or atrophy  Skin: warm and dry, no rash Neuro:  Strength and sensation are intact Psych: euthymic mood, full affect  EKG:  EKG is ordered today. The ekg ordered today demonstrates atypical atrial flutter with a heart rate of 109 bpm.   Recent Labs: 03/25/2017: Magnesium 2.0 01/07/2018: ALT 22; BUN 14; Creatinine, Ser 0.86; Hemoglobin 13.1; Platelets 226; Potassium 4.4; Sodium 141; TSH 2.670    Lipid Panel    Component Value Date/Time   CHOL 195 05/28/2016 1253   TRIG 107.0 05/28/2016 1253   HDL 53.70 05/28/2016 1253   CHOLHDL 4  05/28/2016 1253   VLDL 21.4 05/28/2016 1253   LDLCALC 120 (H) 05/28/2016 1253   LDLDIRECT 173.1 02/19/2010 0908      Wt Readings from Last 3 Encounters:  01/12/18 128 lb 8 oz (58.3 kg)  01/05/18 130 lb (59 kg)  12/04/17 128 lb 12 oz (58.4 kg)       No flowsheet data found.    ASSESSMENT AND PLAN:  1.  Atypical atrial flutter with rapid ventricular response: History of persistent atrial fibrillation with previous cardioversion and maintaining sinus rhythm with amiodarone.  She is now in atypical atrial flutter and ventricular rate is difficult to control.  She is symptomatic.  She is already anticoagulated with Eliquis and has not missed any dose. I think the best option is to proceed with cardioversion this week.  I asked her to hold metoprolol the morning of cardioversion.   2. Essential hypertension: Blood pressure is reasonably controlled.  3. Hyperlipidemia: Currently on atorvastatin 20 mg once daily with most recent LDL of 75.    Disposition:   FU with me in 1 month  Signed,  [name], MD  01/12/2018 2:01 PM    [facility]

## 2018-01-14 ENCOUNTER — Encounter: Payer: Medicare Other | Admitting: Physical Therapy

## 2018-01-16 ENCOUNTER — Other Ambulatory Visit: Payer: Self-pay | Admitting: Family Medicine

## 2018-01-16 ENCOUNTER — Other Ambulatory Visit: Payer: Self-pay

## 2018-01-16 ENCOUNTER — Ambulatory Visit: Payer: Medicare Other | Admitting: Anesthesiology

## 2018-01-16 ENCOUNTER — Other Ambulatory Visit: Payer: Self-pay | Admitting: Cardiovascular Disease

## 2018-01-16 ENCOUNTER — Ambulatory Visit
Admission: RE | Admit: 2018-01-16 | Discharge: 2018-01-16 | Disposition: A | Discharge status: Home / Self Care | Payer: Medicare Other | Source: Ambulatory Visit | Attending: Cardiovascular Disease | Admitting: Cardiovascular Disease

## 2018-01-16 ENCOUNTER — Encounter
Admission: RE | Disposition: A | Discharge status: Home / Self Care | Payer: Self-pay | Source: Ambulatory Visit | Attending: Cardiovascular Disease

## 2018-01-16 DIAGNOSIS — I4819 Other persistent atrial fibrillation: Secondary | ICD-10-CM

## 2018-01-16 DIAGNOSIS — E785 Hyperlipidemia, unspecified: Secondary | ICD-10-CM | POA: Diagnosis not present

## 2018-01-16 DIAGNOSIS — Z79899 Other long term (current) drug therapy: Secondary | ICD-10-CM | POA: Diagnosis not present

## 2018-01-16 DIAGNOSIS — Z880 Allergy status to penicillin: Secondary | ICD-10-CM | POA: Diagnosis not present

## 2018-01-16 DIAGNOSIS — I10 Essential (primary) hypertension: Secondary | ICD-10-CM | POA: Diagnosis not present

## 2018-01-16 DIAGNOSIS — I484 Atypical atrial flutter: Secondary | ICD-10-CM | POA: Insufficient documentation

## 2018-01-16 DIAGNOSIS — M069 Rheumatoid arthritis, unspecified: Secondary | ICD-10-CM | POA: Diagnosis not present

## 2018-01-16 DIAGNOSIS — Z8719 Personal history of other diseases of the digestive system: Secondary | ICD-10-CM | POA: Diagnosis not present

## 2018-01-16 DIAGNOSIS — Z85828 Personal history of other malignant neoplasm of skin: Secondary | ICD-10-CM | POA: Diagnosis not present

## 2018-01-16 DIAGNOSIS — Z7901 Long term (current) use of anticoagulants: Secondary | ICD-10-CM | POA: Insufficient documentation

## 2018-01-16 DIAGNOSIS — G2581 Restless legs syndrome: Secondary | ICD-10-CM | POA: Diagnosis not present

## 2018-01-16 DIAGNOSIS — Z87891 Personal history of nicotine dependence: Secondary | ICD-10-CM | POA: Diagnosis not present

## 2018-01-16 DIAGNOSIS — I739 Peripheral vascular disease, unspecified: Secondary | ICD-10-CM | POA: Diagnosis not present

## 2018-01-16 DIAGNOSIS — K219 Gastro-esophageal reflux disease without esophagitis: Secondary | ICD-10-CM | POA: Insufficient documentation

## 2018-01-16 DIAGNOSIS — I700 Atherosclerosis of aorta: Secondary | ICD-10-CM | POA: Insufficient documentation

## 2018-01-16 DIAGNOSIS — F419 Anxiety disorder, unspecified: Secondary | ICD-10-CM | POA: Diagnosis not present

## 2018-01-16 HISTORY — PX: CARDIOVERSION: EP1203

## 2018-01-16 SURGERY — CARDIOVERSION (CATH LAB)
Anesthesia: General

## 2018-01-16 MED ORDER — PHENYLEPHRINE HCL 10 MG/ML IJ SOLN
INTRAMUSCULAR | Status: AC
  Filled 2018-01-16: qty 1

## 2018-01-16 MED ORDER — PROPOFOL 10 MG/ML IV BOLUS
INTRAVENOUS | Status: AC
  Filled 2018-01-16: qty 20

## 2018-01-16 NOTE — CV Procedure (Signed)
Patient came for scheduled cardioversion.  However, she was noted to be in sinus rhythm.  Cardioversion was canceled.  Continue same medications

## 2018-01-16 NOTE — Addendum Note (Signed)
Addendum  created 01/16/18 0748 by Piscitello, Precious Haws, MD   Intraprocedure Event edited, Intraprocedure Staff edited

## 2018-01-16 NOTE — Progress Notes (Signed)
Patient found to be in SB. Dr. Fletcher Anon notified. MD here to see patient and verified finding. No cardioversion.Patient to follow-up with MD next week.

## 2018-01-16 NOTE — Interval H&P Note (Signed)
History and Physical Interval Note: Procedure cancelled as the patient was noted to be in sinus rhythm.   01/16/2018 8:13 AM  [name]  has presented today for surgery, with the diagnosis of Cardioversion   Afib  The various methods of treatment have been discussed with the patient and family. After consideration of risks, benefits and other options for treatment, the patient has consented to  Procedure(s): CARDIOVERSION (N/A) as a surgical intervention .  The patient's history has been reviewed, patient examined, no change in status, stable for surgery.  I have reviewed the patient's chart and labs.  Questions were answered to the patient's satisfaction.     [name]

## 2018-01-16 NOTE — Telephone Encounter (Signed)
Please review for refill.  

## 2018-01-16 NOTE — Telephone Encounter (Signed)
Pt has not had lipid panel since 05/2016 and last Rx indicates CPE required. pls advise

## 2018-01-16 NOTE — Anesthesia Preprocedure Evaluation (Signed)
Anesthesia Evaluation  Patient identified by MRN, date of birth, ID band Patient awake    Reviewed: Allergy & Precautions, H&P , NPO status , Patient's Chart, lab work & pertinent test results, reviewed documented beta blocker date and time   Airway Mallampati: III  TM Distance: >3 FB Neck ROM: Full    Dental no notable dental hx. (+) Upper Dentures, Lower Dentures, Dental Advisory Given   Pulmonary neg shortness of breath, former smoker,    Pulmonary exam normal breath sounds clear to auscultation       Cardiovascular Exercise Tolerance: Good hypertension, Pt. on medications and Pt. on home beta blockers + Peripheral Vascular Disease  + dysrhythmias Atrial Fibrillation + Valvular Problems/Murmurs MR  Rhythm:Irregular Rate:Normal     Neuro/Psych PSYCHIATRIC DISORDERS Anxiety Depression    GI/Hepatic Neg liver ROS, PUD, GERD  Medicated and Controlled,  Endo/Other  negative endocrine ROS  Renal/GU negative Renal ROS  negative genitourinary   Musculoskeletal  (+) Arthritis , Osteoarthritis,    Abdominal   Peds negative pediatric ROS (+)  Hematology negative hematology ROS (+)   Anesthesia Other Findings Past Medical History: 09/2015: Abdominal aortic atherosclerosis (HCC)     Comment:  by xray No date: Anxiety 05/2011: BCC (basal cell carcinoma of skin)     Comment:  on nose No date: Breast lesion     Comment:  a. diagnosed with complex sclerosing lesion with               calcifications of the right breast in 08/2016 No date: Chest pain     Comment:  a. nuclear stress test 03/2012: normal 12/06/1992: DDD (degenerative disc disease), lumbosacral 09/2015: Gallstones     Comment:  incidentally by xray 05/1999: GERD (gastroesophageal reflux disease) 04/13/2007: HERPES ZOSTER     Comment:  Qualifier: Diagnosis of  By: [name], [name]  No date: History of shingles     Comment:  ophthalmic,  takes acyclovir daily 12/2003: Hyperlipidemia 1990: Hypertension No date: Mitral regurgitation     Comment:  a. echo 03/2012: EF 60-65%, no RWMA, mild to mod AI/MR,               mod TR, PASP 37 mmHg 08/21/1989: Osteoarthritis 11/1997: Osteoporosis with fracture     Comment:  with compression fracture T12 03/16/2012: Persistent atrial fibrillation     Comment:  a. CHADS2VASc => 5 (HTN, age x 2, vascular disease, sex               category)-->Eliquis 5 BID;  b. Recurrent AF in 06/2016;                c. 01/2017 s/p DCCV;  d. 02/2017 recurrent AFib->Amio               added->DCCV; e. 03/2017 recurrent AFib, s/p reload of               amio and DCCV 04/02/2017 No date: Rheumatoid arthritis ([place]) No date: RLS (restless legs syndrome)   Reproductive/Obstetrics negative OB ROS                             Anesthesia Physical  Anesthesia Plan  ASA: III  Anesthesia Plan: General   Post-op Pain Management:    Induction: Intravenous  PONV Risk Score and Plan: 4 or greater and Ondansetron, Dexamethasone and Treatment  may vary due to age or medical condition  Airway Management Planned: Nasal Cannula  Additional Equipment:   Intra-op Plan:   Post-operative Plan:   Informed Consent: I have reviewed the patients History and Physical, chart, labs and discussed the procedure including the risks, benefits and alternatives for the proposed anesthesia with the patient or authorized representative who has indicated his/her understanding and acceptance.     Dental advisory given  Plan Discussed with: CRNA and Surgeon  Anesthesia Plan Comments: (Patient consented for risks of anesthesia including but not limited to:  - adverse reactions to medications - risk of intubation if required - damage to teeth, lips or other oral mucosa - sore throat or hoarseness - Damage to heart, brain, lungs or loss of life  Patient voiced understanding.)        Anesthesia  Quick Evaluation  

## 2018-01-16 NOTE — Addendum Note (Signed)
Addendum  created 01/16/18 0757 by Marg Macmaster, Precious Haws, MD   Intraprocedure Event deleted

## 2018-01-18 ENCOUNTER — Encounter: Payer: Self-pay | Admitting: Cardiovascular Disease

## 2018-01-19 ENCOUNTER — Ambulatory Visit: Payer: Medicare Other | Admitting: Physical Therapy

## 2018-01-19 ENCOUNTER — Encounter: Payer: Self-pay | Admitting: Physical Therapy

## 2018-01-19 DIAGNOSIS — M6281 Muscle weakness (generalized): Secondary | ICD-10-CM

## 2018-01-19 DIAGNOSIS — R269 Unspecified abnormalities of gait and mobility: Secondary | ICD-10-CM | POA: Diagnosis not present

## 2018-01-19 DIAGNOSIS — R2681 Unsteadiness on feet: Secondary | ICD-10-CM

## 2018-01-19 NOTE — Therapy (Signed)
[place] MAIN [facility] SERVICES [address] [place], [place], [address] Phone: [phone]   Fax:  [phone]  Physical Therapy Treatment  Patient Details  Name: [name] MRN: [id number] Date of Birth: 1930/02/04 Referring Provider (PT): Ria Bush   Encounter Date: 01/19/2018  PT End of Session - 01/19/18 1455    Visit Number  9    Number of Visits  17    Date for PT Re-Evaluation  02/01/16    Authorization Type  9/10    PT Start Time  0230    PT Stop Time  0315    PT Time Calculation (min)  45 min    Equipment Utilized During Treatment  Gait belt    Activity Tolerance  Patient tolerated treatment well;Patient limited by fatigue    Behavior During Therapy  [facility] for tasks assessed/performed       Past Medical History:  Diagnosis Date  . Abdominal aortic atherosclerosis ([place]) 09/2015   by xray  . Anxiety   . BCC (basal cell carcinoma of skin) 05/2011   on nose  . Breast lesion    a. diagnosed with complex sclerosing lesion with calcifications of the right breast in 08/2016  . Chest pain    a. nuclear stress test 03/2012: normal  . DDD (degenerative disc disease), lumbosacral 12/06/1992  . Gallstones 09/2015   incidentally by xray  . GERD (gastroesophageal reflux disease) 05/1999  . HERPES ZOSTER 04/13/2007   Qualifier: Diagnosis of  By: [name], [name]   . History of shingles    ophthalmic, takes acyclovir daily  . Hyperlipidemia 12/2003  . Hypertension 1990  . Mitral regurgitation    a. echo 03/2012: EF 60-65%, no RWMA, mild to mod AI/MR, mod TR, PASP 37 mmHg  . Osteoarthritis 08/21/1989  . Osteoporosis with fracture 11/1997   with compression fracture T12  . Persistent atrial fibrillation 03/16/2012   a. CHADS2VASc => 5 (HTN, age x 2, vascular disease, sex category)-->Eliquis 5 BID;  b. Recurrent AF in 06/2016;  c. 01/2017 s/p DCCV;  d. 02/2017 recurrent AFib->Amio added->DCCV; e. 03/2017 recurrent  AFib, s/p reload of amio and DCCV 04/02/2017  . Rheumatoid arthritis ([name])   . RLS (restless legs syndrome)     Past Surgical History:  Procedure Laterality Date  . ABDOMINAL HYSTERECTOMY  Age 30 - 57   S/P TAH  . abdominal ultrasound  04/23/2004   Gallstones  . BREAST LUMPECTOMY WITH RADIOACTIVE SEED LOCALIZATION Right 10/2016   breast lumpectomy with radioactive seed localization and margin assessment for complex sclerosing lesion [facility])  . BREAST LUMPECTOMY WITH RADIOACTIVE SEED LOCALIZATION Right 11/05/2016   Procedure: RIGHT BREAST LUMPECTOMY WITH RADIOACTIVE SEED LOCALIZATION;  Surgeon: Fanny Skates, MD;  Location: [place];  Service: General;  Laterality: Right;  . CARDIOVERSION N/A 01/31/2017   Procedure: CARDIOVERSION;  Surgeon: Wellington Hampshire, MD;  Location: [facility];  Service: Cardiovascular;  Laterality: N/A;  . CARDIOVERSION N/A 03/03/2017   Procedure: CARDIOVERSION;  Surgeon: Wellington Hampshire, MD;  Location: [place] [facility];  Service: Cardiovascular;  Laterality: N/A;  . CARDIOVERSION N/A 04/02/2017   Procedure: CARDIOVERSION;  Surgeon: Wellington Hampshire, MD;  Location: [facility];  Service: Cardiovascular;  Laterality: N/A;  . CARDIOVERSION N/A 01/16/2018   Procedure: CARDIOVERSION;  Surgeon: Wellington Hampshire, MD;  Location: [facility];  Service: Cardiovascular;  Laterality: N/A;  . CATARACT EXTRACTION  05/2010   left eye  . CERVICAL DISCECTOMY  1992  Fusion (Dr. Joya Salm)  . ESI Bilateral 01/2016   S1 transforaminal ESI x3  . ESOPHAGOGASTRODUODENOSCOPY  01/01/2002   with ulcer, bx. neg; + stricture gastric ulcer with hemorrhage  . ESOPHAGOGASTRODUODENOSCOPY  04/23/2004   Esophageal stricture -- dilated  . nuclear stress test  03/2012   no ischemia  . SKIN CANCER EXCISION  05/20/2011   BCC from nose  . US ECHOCARDIOGRAPHY  03/2012   in flutter, EF 60%, mild-mod aortic, mitral, tricuspid regurg, mildly dilated LA    There were no vitals filed for  this visit.    Treatment: Nu-step x 5 mins L 2  Leg press 90 lbs x 20 x 2 sets, 45 lbs for heel raise x 20 x 2  Lunge into BOSU ball x 15 BLE with cues for correct posture Step ups to 6 inch stool x 10 x 2 sets BLE Eccentric step downs from 6 inch stool to floor x 10 BLe x 2 sets  Reviewed HEP    CGA and  mod verbal cues used throughout with increased in postural sway and LOB most seen with narrow base of support and while on uneven surfaces. Continues to have balance deficits typical with diagnosis. Patient performs intermediate level exercises without pain behaviors                     PT Education - 01/19/18 1455    Education provided  Yes    Education Details  HEP    Person(s) Educated  Patient    Methods  Explanation;Demonstration;Tactile cues;Verbal cues    Comprehension  Verbalized understanding;Returned demonstration;Need further instruction       PT Short Term Goals - 12/09/17 1728      PT SHORT TERM GOAL #1   Title  Patient will be independent in home exercise program to improve strength/mobility for better functional independence with ADLs.    Time  4    Period  Weeks    Status  New    Target Date  01/06/18      PT SHORT TERM GOAL #2   Title  Patient (< 48 years old) will complete five times sit to stand test in < 10 seconds indicating an increased LE strength and improved balance.    Time  4    Period  Weeks    Status  New    Target Date  01/06/18        PT Long Term Goals - 12/09/17 1730      PT LONG TERM GOAL #1   Title  Patient will increase Berg Balance score by > 6 points to demonstrate decreased fall risk during functional activities.    Time  8    Period  Weeks    Status  New    Target Date  02/03/18      PT LONG TERM GOAL #2   Title  Patient will increase six minute walk test distance to >1000 for progression to community ambulator and improve gait ability    Time  8    Period  Weeks    Status  New    Target Date  02/03/18       PT LONG TERM GOAL #3   Title  Patient will increase 10 meter walk test to >1.68m/s as to improve gait speed for better community ambulation and to reduce fall risk.    Time  8    Period  Weeks    Status  New  Target Date  02/03/18            Plan - 01/19/18 1456    Clinical Impression Statement  Patient demonstrates improved stability and strength allowing patient to perform short duration standing interventions with rest periods.  Patient performs beginning standing dynamic standing balance exercises to shift weight and perform single leg standing activities with mod assist. Patient fatigues quickly with exercises requiring rest breaks at this time. Patient will continue to benefit from skilled physical therapy to improve pain and mobility.    Rehab Potential  Good    PT Frequency  2x / week    PT Duration  8 weeks    PT Treatment/Interventions  Balance training;Neuromuscular re-education;Therapeutic exercise;Patient/family education;Manual techniques;Therapeutic activities;Moist Heat;Aquatic Therapy;Functional mobility training    PT Next Visit Plan  HEP progression, balance training, strengthening    PT Home Exercise Plan  heel raises, sit to stand    Consulted and Agree with Plan of Care  Patient;Family member/caregiver       Patient will benefit from skilled therapeutic intervention in order to improve the following deficits and impairments:  Decreased balance, Decreased endurance, Abnormal gait, Decreased mobility, Difficulty walking, Dizziness, Pain, Impaired flexibility, Decreased strength, Decreased activity tolerance  Visit Diagnosis: Unsteadiness on feet  Abnormality of gait and mobility  Muscle weakness (generalized)     Problem List Patient Active Problem List   Diagnosis Date Noted  . Peripheral neuropathy 12/04/2017  . General unsteadiness 10/01/2017  . Constipation 10/01/2017  . Esophageal dysphagia 04/08/2017  . Chronic radicular pain of lower back  04/08/2017  . Fall 03/04/2017  . Encounter for chronic pain management 11/11/2016  . Breast mass, right 09/29/2016  . Abdominal aortic atherosclerosis ([place]) 09/07/2015  . Insomnia 06/08/2015  . Health maintenance examination 12/08/2014  . Advanced care planning/counseling discussion 12/08/2014  . Chronic leg pain 10/13/2014  . Fatigue 10/28/2013  . Persistent atrial fibrillation 04/24/2013  . Medicare annual wellness visit, subsequent 04/14/2012  . Atypical chest pain 03/16/2012  . RLS (restless legs syndrome) 02/07/2012  . DDD (degenerative disc disease), lumbar 12/18/2009  . MDD (major depressive disorder), recurrent episode ([place]) 12/10/2007  . POSTHERPETIC NEURALGIA 04/20/2007  . HLD (hyperlipidemia) 11/24/2006  . PLMD (periodic limb movement disorder) 11/24/2006  . Essential hypertension 11/24/2006  . GERD 11/24/2006  . GASTRIC ULCER 11/24/2006  . Osteoarthritis 11/24/2006  . Osteoporosis with pathological fracture 11/24/2006    Alanson Puls, PT DPT 01/19/2018, 2:58 PM  [place] MAIN [facility] SERVICES [address] [place], [place], [address] Phone: [phone]   Fax:  [phone]  Name: [name] MRN: [id number] Date of Birth: 09-04-1929

## 2018-01-21 ENCOUNTER — Encounter: Payer: Self-pay | Admitting: Physical Therapy

## 2018-01-21 ENCOUNTER — Ambulatory Visit: Payer: Medicare Other | Admitting: Physical Therapy

## 2018-01-21 DIAGNOSIS — M6281 Muscle weakness (generalized): Secondary | ICD-10-CM

## 2018-01-21 DIAGNOSIS — R269 Unspecified abnormalities of gait and mobility: Secondary | ICD-10-CM

## 2018-01-21 DIAGNOSIS — R2681 Unsteadiness on feet: Secondary | ICD-10-CM

## 2018-01-21 NOTE — Therapy (Signed)
[place] MAIN [facility] SERVICES [address] [place], [place], [address] Phone: [phone]   Fax:  [phone]  Physical Therapy Treatment Physical Therapy Progress Note   Dates of reporting period  12/09/2017   to   01/21/2018   Patient Details  Name: [name] MRN: [id number] Date of Birth: June 25, 1929 Referring Provider (PT): Ria Bush   Encounter Date: 01/21/2018  PT End of Session - 01/21/18 1428    Visit Number  10    Number of Visits  17    Date for PT Re-Evaluation  02/01/16    Authorization Type  1/10    PT Start Time  1430    PT Stop Time  1515    PT Time Calculation (min)  45 min    Equipment Utilized During Treatment  Gait belt    Activity Tolerance  Patient tolerated treatment well;Patient limited by fatigue    Behavior During Therapy  WFL for tasks assessed/performed       Past Medical History:  Diagnosis Date  . Abdominal aortic atherosclerosis ([place]) 09/2015   by xray  . Anxiety   . BCC (basal cell carcinoma of skin) 05/2011   on nose  . Breast lesion    a. diagnosed with complex sclerosing lesion with calcifications of the right breast in 08/2016  . Chest pain    a. nuclear stress test 03/2012: normal  . DDD (degenerative disc disease), lumbosacral 12/06/1992  . Gallstones 09/2015   incidentally by xray  . GERD (gastroesophageal reflux disease) 05/1999  . HERPES ZOSTER 04/13/2007   Qualifier: Diagnosis of  By: [name], [name]   . History of shingles    ophthalmic, takes acyclovir daily  . Hyperlipidemia 12/2003  . Hypertension 1990  . Mitral regurgitation    a. echo 03/2012: EF 60-65%, no RWMA, mild to mod AI/MR, mod TR, PASP 37 mmHg  . Osteoarthritis 08/21/1989  . Osteoporosis with fracture 11/1997   with compression fracture T12  . Persistent atrial fibrillation 03/16/2012   a. CHADS2VASc => 5 (HTN, age x 2, vascular disease, sex category)-->Eliquis 5 BID;  b. Recurrent AF in  06/2016;  c. 01/2017 s/p DCCV;  d. 02/2017 recurrent AFib->Amio added->DCCV; e. 03/2017 recurrent AFib, s/p reload of amio and DCCV 04/02/2017  . Rheumatoid arthritis ([place])   . RLS (restless legs syndrome)     Past Surgical History:  Procedure Laterality Date  . ABDOMINAL HYSTERECTOMY  Age 5 - 81   S/P TAH  . abdominal ultrasound  04/23/2004   Gallstones  . BREAST LUMPECTOMY WITH RADIOACTIVE SEED LOCALIZATION Right 10/2016   breast lumpectomy with radioactive seed localization and margin assessment for complex sclerosing lesion [facility])  . BREAST LUMPECTOMY WITH RADIOACTIVE SEED LOCALIZATION Right 11/05/2016   Procedure: RIGHT BREAST LUMPECTOMY WITH RADIOACTIVE SEED LOCALIZATION;  Surgeon: Fanny Skates, MD;  Location: [place];  Service: General;  Laterality: Right;  . CARDIOVERSION N/A 01/31/2017   Procedure: CARDIOVERSION;  Surgeon: Wellington Hampshire, MD;  Location: [facility];  Service: Cardiovascular;  Laterality: N/A;  . CARDIOVERSION N/A 03/03/2017   Procedure: CARDIOVERSION;  Surgeon: Wellington Hampshire, MD;  Location: [facility];  Service: Cardiovascular;  Laterality: N/A;  . CARDIOVERSION N/A 04/02/2017   Procedure: CARDIOVERSION;  Surgeon: Wellington Hampshire, MD;  Location: [facility];  Service: Cardiovascular;  Laterality: N/A;  . CARDIOVERSION N/A 01/16/2018   Procedure: CARDIOVERSION;  Surgeon: Wellington Hampshire, MD;  Location: [facility];  Service: Cardiovascular;  Laterality: N/A;  . CATARACT EXTRACTION  05/2010   left eye  . CERVICAL DISCECTOMY  1992   Fusion (Dr. Joya Salm)  . ESI Bilateral 01/2016   S1 transforaminal ESI x3  . ESOPHAGOGASTRODUODENOSCOPY  01/01/2002   with ulcer, bx. neg; + stricture gastric ulcer with hemorrhage  . ESOPHAGOGASTRODUODENOSCOPY  04/23/2004   Esophageal stricture -- dilated  . nuclear stress test  03/2012   no ischemia  . SKIN CANCER EXCISION  05/20/2011   BCC from nose  . US ECHOCARDIOGRAPHY  03/2012   in flutter, EF 60%,  mild-mod aortic, mitral, tricuspid regurg, mildly dilated LA    There were no vitals filed for this visit.  Subjective Assessment - 01/21/18 1428    Subjective  Patient states that her only complaint is her feet and ankles (swelling). Patient has back pain, but that it is manageable.    Patient is accompained by:  Family member    Pertinent History  Patient has been having balance defictis and has had several falls in the past year.     Patient Stated Goals  "I just want it to quit hurting."    Currently in Pain?  Yes    Pain Score  3     Pain Location  Back    Pain Orientation  Lower    Pain Descriptors / Indicators  Aching    Pain Type  Chronic pain    Pain Onset  More than a month ago    Pain Frequency  Intermittent    Multiple Pain Sites  No      TREATMENT  Therapeutic Exercise: Nu-step, L2, 7.5 min, VCs for pacing and pressing with whole foot (hx taken during nu-step, as well) 10 m walk test: 0.56m/s 6MWT: IND, rollator, 580 ft. Patient walked continuously for 6 minutes, navigated turns without LOB.  Patient educated on technique during exercises and gait for appropriate body mechanics, muscle activation, stride length, and arm swing.   Neuromuscular Re-education: 5 times sit<>stand (modified with BUE support): 41.3sec Berg: 46/56, limitations apparent in SLS portions  Dual task gait, IND w/ rollator, head turns in an open environment. No LOB, required minimal VCs for right hip flexion during swing phase 2/2 to fatigue.     PT Education - 01/21/18 1525    Education provided  Yes    Education Details  balance strategies, activity pacing, safety    Person(s) Educated  Patient;Child(ren)    Methods  Explanation;Demonstration;Verbal cues    Comprehension  Verbalized understanding;Need further instruction;Returned demonstration       PT Short Term Goals - 01/21/18 1441      PT SHORT TERM GOAL #1   Title  Patient will be independent in home exercise program to  improve strength/mobility for better functional independence with ADLs.    Time  4    Period  Weeks    Status  Achieved    Target Date  01/06/18      PT SHORT TERM GOAL #2   Title  Patient (< 84 years old) will complete five times sit to stand test in < 10 seconds indicating an increased LE strength and improved balance.    Baseline  01/21/18=UE support 41.3 s    Time  4    Period  Weeks    Status  On-going    Target Date  01/06/18        PT Long Term Goals - 01/21/18 1453      PT LONG TERM GOAL #1  Title  Patient will increase Berg Balance score by > 6 points to demonstrate decreased fall risk during functional activities.    Baseline  01/21/18= 46/56    Time  8    Period  Weeks    Status  Achieved    Target Date  02/03/18      PT LONG TERM GOAL #2   Title  Patient will increase six minute walk test distance to >1000 for progression to community ambulator and improve gait ability    Baseline  01/21/18= 553ft w/ rollator    Time  8    Period  Weeks    Status  On-going    Target Date  02/03/18      PT LONG TERM GOAL #3   Title  Patient will increase 10 meter walk test to >1.43m/s as to improve gait speed for better community ambulation and to reduce fall risk.    Baseline  01/21/18= 0.82 m/s (no AD)    Time  8    Period  Weeks    Status  On-going    Target Date  02/03/18            Plan - 01/21/18 1549    Clinical Impression Statement Patient demonstrates improvements in balance demonstrated by her ability to ambulate 17m at a pace of 0.70m/s without an AD and with SBA and a Berg Balance score of 46/56. Patient has remaining deficits in balance and BLE strength as evidenced by her increased time and reliance on UE support during the 5 times sit-to-stand test (41.3 sec) and limited ability to achieve 580 ft during the 6MWT. Patient will benefit from continued skilled therapeutic intervention to address deficits in balance, BLE strength, and safety in order to return  to PLOF, improve overall QOL, and reduce risk of falls. Patient's condition has the potential to improve in response to therapy. Maximum improvement is yet to be obtained. The anticipated improvement is attainable and reasonable in a generally predictable time.     Rehab Potential  Good    PT Frequency  2x / week    PT Duration  8 weeks    PT Treatment/Interventions  Balance training;Neuromuscular re-education;Therapeutic exercise;Patient/family education;Manual techniques;Therapeutic activities;Moist Heat;Aquatic Therapy;Functional mobility training    PT Next Visit Plan  HEP progression, balance training, strengthening    PT Home Exercise Plan  heel raises, sit to stand    Consulted and Agree with Plan of Care  Patient;Family member/caregiver       Patient will benefit from skilled therapeutic intervention in order to improve the following deficits and impairments:  Decreased balance, Decreased endurance, Abnormal gait, Decreased mobility, Difficulty walking, Dizziness, Pain, Impaired flexibility, Decreased strength, Decreased activity tolerance  Visit Diagnosis: Unsteadiness on feet  Abnormality of gait and mobility  Muscle weakness (generalized)     Problem List Patient Active Problem List   Diagnosis Date Noted  . Peripheral neuropathy 12/04/2017  . General unsteadiness 10/01/2017  . Constipation 10/01/2017  . Esophageal dysphagia 04/08/2017  . Chronic radicular pain of lower back 04/08/2017  . Fall 03/04/2017  . Encounter for chronic pain management 11/11/2016  . Breast mass, right 09/29/2016  . Abdominal aortic atherosclerosis ([place]) 09/07/2015  . Insomnia 06/08/2015  . Health maintenance examination 12/08/2014  . Advanced care planning/counseling discussion 12/08/2014  . Chronic leg pain 10/13/2014  . Fatigue 10/28/2013  . Persistent atrial fibrillation 04/24/2013  . Medicare annual wellness visit, subsequent 04/14/2012  . Atypical chest pain 03/16/2012  . RLS  (  restless legs syndrome) 02/07/2012  . DDD (degenerative disc disease), lumbar 12/18/2009  . MDD (major depressive disorder), recurrent episode ([place]) 12/10/2007  . POSTHERPETIC NEURALGIA 04/20/2007  . HLD (hyperlipidemia) 11/24/2006  . PLMD (periodic limb movement disorder) 11/24/2006  . Essential hypertension 11/24/2006  . GERD 11/24/2006  . GASTRIC ULCER 11/24/2006  . Osteoarthritis 11/24/2006  . Osteoporosis with pathological fracture 11/24/2006   Myles Gip PT, DPT [phone] 01/21/2018, 3:50 PM  [place] MAIN [facility] SERVICES [address] [place], [place], [address] Phone: [phone]   Fax:  [phone]  Name: [name] MRN: [id number] Date of Birth: 29-Jul-1929

## 2018-01-26 ENCOUNTER — Encounter: Payer: Self-pay | Admitting: Physical Therapy

## 2018-01-26 ENCOUNTER — Ambulatory Visit: Payer: Medicare Other | Admitting: Physical Therapy

## 2018-01-26 DIAGNOSIS — R269 Unspecified abnormalities of gait and mobility: Secondary | ICD-10-CM

## 2018-01-26 DIAGNOSIS — R2681 Unsteadiness on feet: Secondary | ICD-10-CM | POA: Diagnosis not present

## 2018-01-26 DIAGNOSIS — M6281 Muscle weakness (generalized): Secondary | ICD-10-CM

## 2018-01-26 NOTE — Therapy (Signed)
[name] MAIN [facility] SERVICES [address] [place], [place], [address] Phone: [phone]   Fax:  [phone]  Physical Therapy Treatment  Patient Details  Name: [name] MRN: [id number] Date of Birth: 1929-10-20 Referring Provider (PT): Ria Bush   Encounter Date: 01/26/2018  PT End of Session - 01/26/18 1411    Visit Number  11    Number of Visits  17    Date for PT Re-Evaluation  02/01/16    Authorization Type  1/10    PT Start Time  0150    PT Stop Time  0230    PT Time Calculation (min)  40 min    Equipment Utilized During Treatment  Gait belt    Activity Tolerance  Patient tolerated treatment well;Patient limited by fatigue    Behavior During Therapy  WFL for tasks assessed/performed       Past Medical History:  Diagnosis Date  . Abdominal aortic atherosclerosis ([place]) 09/2015   by xray  . Anxiety   . BCC (basal cell carcinoma of skin) 05/2011   on nose  . Breast lesion    a. diagnosed with complex sclerosing lesion with calcifications of the right breast in 08/2016  . Chest pain    a. nuclear stress test 03/2012: normal  . DDD (degenerative disc disease), lumbosacral 12/06/1992  . Gallstones 09/2015   incidentally by xray  . GERD (gastroesophageal reflux disease) 05/1999  . HERPES ZOSTER 04/13/2007   Qualifier: Diagnosis of  By: [name], [name]   . History of shingles    ophthalmic, takes acyclovir daily  . Hyperlipidemia 12/2003  . Hypertension 1990  . Mitral regurgitation    a. echo 03/2012: EF 60-65%, no RWMA, mild to mod AI/MR, mod TR, PASP 37 mmHg  . Osteoarthritis 08/21/1989  . Osteoporosis with fracture 11/1997   with compression fracture T12  . Persistent atrial fibrillation 03/16/2012   a. CHADS2VASc => 5 (HTN, age x 2, vascular disease, sex category)-->Eliquis 5 BID;  b. Recurrent AF in 06/2016;  c. 01/2017 s/p DCCV;  d. 02/2017 recurrent AFib->Amio added->DCCV; e. 03/2017 recurrent  AFib, s/p reload of amio and DCCV 04/02/2017  . Rheumatoid arthritis ([place])   . RLS (restless legs syndrome)     Past Surgical History:  Procedure Laterality Date  . ABDOMINAL HYSTERECTOMY  Age 53 - 68   S/P TAH  . abdominal ultrasound  04/23/2004   Gallstones  . BREAST LUMPECTOMY WITH RADIOACTIVE SEED LOCALIZATION Right 10/2016   breast lumpectomy with radioactive seed localization and margin assessment for complex sclerosing lesion [facility])  . BREAST LUMPECTOMY WITH RADIOACTIVE SEED LOCALIZATION Right 11/05/2016   Procedure: RIGHT BREAST LUMPECTOMY WITH RADIOACTIVE SEED LOCALIZATION;  Surgeon: Fanny Skates, MD;  Location: [place];  Service: General;  Laterality: Right;  . CARDIOVERSION N/A 01/31/2017   Procedure: CARDIOVERSION;  Surgeon: Wellington Hampshire, MD;  Location: [facility];  Service: Cardiovascular;  Laterality: N/A;  . CARDIOVERSION N/A 03/03/2017   Procedure: CARDIOVERSION;  Surgeon: Wellington Hampshire, MD;  Location: [place] [facility];  Service: Cardiovascular;  Laterality: N/A;  . CARDIOVERSION N/A 04/02/2017   Procedure: CARDIOVERSION;  Surgeon: Wellington Hampshire, MD;  Location: [facility];  Service: Cardiovascular;  Laterality: N/A;  . CARDIOVERSION N/A 01/16/2018   Procedure: CARDIOVERSION;  Surgeon: Wellington Hampshire, MD;  Location: [facility];  Service: Cardiovascular;  Laterality: N/A;  . CATARACT EXTRACTION  05/2010   left eye  . CERVICAL DISCECTOMY  1992  Fusion (Dr. Joya Salm)  . ESI Bilateral 01/2016   S1 transforaminal ESI x3  . ESOPHAGOGASTRODUODENOSCOPY  01/01/2002   with ulcer, bx. neg; + stricture gastric ulcer with hemorrhage  . ESOPHAGOGASTRODUODENOSCOPY  04/23/2004   Esophageal stricture -- dilated  . nuclear stress test  03/2012   no ischemia  . SKIN CANCER EXCISION  05/20/2011   BCC from nose  . US ECHOCARDIOGRAPHY  03/2012   in flutter, EF 60%, mild-mod aortic, mitral, tricuspid regurg, mildly dilated LA    There were no vitals filed for  this visit.  Subjective Assessment - 01/26/18 1410    Subjective  Patient states that her only complaint is her feet and ankles (swelling). Patient has back pain, but that it is manageable.    Patient is accompained by:  Family member    Pertinent History  Patient has been having balance defictis and has had several falls in the past year.     Patient Stated Goals  "I just want it to quit hurting."    Currently in Pain?  Yes    Pain Score  5     Pain Location  Back    Pain Orientation  Lower    Pain Descriptors / Indicators  Aching    Pain Onset  More than a month ago    Pain Frequency  Constant       Treatment Nu-step x 5 mins at 50 SPM level 2 Standing with YTB side stepping x 10 feet in parallel bars x 5 laps with UE support Standing hip extension x 15 BLE Standing hip abd x 15 BLE  Squats x 15  BOSU ball lunges x 10 BLE  Step ups to 6 inch stool x 15  Eccentric step downs from 6 inch stool to blue foam x 10 BLE Bridges x 15 hooklying marching x 15  hooklying ER with RTB  x 30 x 2  Supine SLR with 2 lbs X 10 x 2  BLE Leg press x 60 lbs x 20, Heel raises x 15 x 3  CGA and Min to mod verbal cues used throughout with increased in postural sway and LOB most seen with narrow base of support and while on uneven surfaces. Continues to have balance deficits typical with diagnosis. Patient performsbeginnignlevel exercises without pain behaviors and needs verbal cuing for postural alignment and head positioning                        PT Education - 01/26/18 1410    Education provided  Yes    Education Details  HEP    Person(s) Educated  Patient    Methods  Explanation;Demonstration;Tactile cues    Comprehension  Verbalized understanding;Need further instruction       PT Short Term Goals - 01/21/18 1441      PT SHORT TERM GOAL #1   Title  Patient will be independent in home exercise program to improve strength/mobility for better functional  independence with ADLs.    Time  4    Period  Weeks    Status  Achieved    Target Date  01/06/18      PT SHORT TERM GOAL #2   Title  Patient (< 64 years old) will complete five times sit to stand test in < 10 seconds indicating an increased LE strength and improved balance.    Baseline  01/21/18=UE support 41.3 s    Time  4    Period  Weeks    Status  On-going    Target Date  01/06/18        PT Long Term Goals - 01/21/18 1453      PT LONG TERM GOAL #1   Title  Patient will increase Berg Balance score by > 6 points to demonstrate decreased fall risk during functional activities.    Baseline  01/21/18= 46/56    Time  8    Period  Weeks    Status  Achieved    Target Date  02/03/18      PT LONG TERM GOAL #2   Title  Patient will increase six minute walk test distance to >1000 for progression to community ambulator and improve gait ability    Baseline  01/21/18= 532ft w/ rollator    Time  8    Period  Weeks    Status  On-going    Target Date  02/03/18      PT LONG TERM GOAL #3   Title  Patient will increase 10 meter walk test to >1.97m/s as to improve gait speed for better community ambulation and to reduce fall risk.    Baseline  01/21/18= 0.82 m/s (no AD)    Time  8    Period  Weeks    Status  On-going    Target Date  02/03/18            Plan - 01/26/18 1411    Clinical Impression Statement  Patient demonstrates improved stability and strength allowing patient to perform short duration standing interventions with rest periods.  Patient performs beginning standing dynamic standing balance exercises to shift weight and perform single leg standing activities with mod assist. Patient fatigues quickly with exercises requiring rest breaks at this time. Patient will continue to benefit from skilled physical therapy to improve pain and mobility    Rehab Potential  Good    PT Frequency  2x / week    PT Duration  8 weeks    PT Treatment/Interventions  Balance  training;Neuromuscular re-education;Therapeutic exercise;Patient/family education;Manual techniques;Therapeutic activities;Moist Heat;Aquatic Therapy;Functional mobility training    PT Next Visit Plan  HEP progression, balance training, strengthening    PT Home Exercise Plan  heel raises, sit to stand    Consulted and Agree with Plan of Care  Patient;Family member/caregiver       Patient will benefit from skilled therapeutic intervention in order to improve the following deficits and impairments:  Decreased balance, Decreased endurance, Abnormal gait, Decreased mobility, Difficulty walking, Dizziness, Pain, Impaired flexibility, Decreased strength, Decreased activity tolerance  Visit Diagnosis: Unsteadiness on feet  Abnormality of gait and mobility  Muscle weakness (generalized)     Problem List Patient Active Problem List   Diagnosis Date Noted  . Peripheral neuropathy 12/04/2017  . General unsteadiness 10/01/2017  . Constipation 10/01/2017  . Esophageal dysphagia 04/08/2017  . Chronic radicular pain of lower back 04/08/2017  . Fall 03/04/2017  . Encounter for chronic pain management 11/11/2016  . Breast mass, right 09/29/2016  . Abdominal aortic atherosclerosis ([place]) 09/07/2015  . Insomnia 06/08/2015  . Health maintenance examination 12/08/2014  . Advanced care planning/counseling discussion 12/08/2014  . Chronic leg pain 10/13/2014  . Fatigue 10/28/2013  . Persistent atrial fibrillation 04/24/2013  . Medicare annual wellness visit, subsequent 04/14/2012  . Atypical chest pain 03/16/2012  . RLS (restless legs syndrome) 02/07/2012  . DDD (degenerative disc disease), lumbar 12/18/2009  . MDD (major depressive disorder), recurrent episode ([place]) 12/10/2007  . POSTHERPETIC NEURALGIA 04/20/2007  .  HLD (hyperlipidemia) 11/24/2006  . PLMD (periodic limb movement disorder) 11/24/2006  . Essential hypertension 11/24/2006  . GERD 11/24/2006  . GASTRIC ULCER 11/24/2006  .  Osteoarthritis 11/24/2006  . Osteoporosis with pathological fracture 11/24/2006    Alanson Puls, PT DPT 01/26/2018, 2:13 PM  [place] MAIN [facility] SERVICES [address] [place], [place], [address] Phone: [phone]   Fax:  [phone]  Name: JUANETTE URIZAR MRN: [id number] Date of Birth: 10-Feb-1930

## 2018-01-29 ENCOUNTER — Ambulatory Visit: Payer: Medicare Other | Admitting: Physical Therapy

## 2018-01-29 ENCOUNTER — Encounter: Payer: Self-pay | Admitting: Physical Therapy

## 2018-01-29 DIAGNOSIS — R269 Unspecified abnormalities of gait and mobility: Secondary | ICD-10-CM

## 2018-01-29 DIAGNOSIS — R2681 Unsteadiness on feet: Secondary | ICD-10-CM

## 2018-01-29 DIAGNOSIS — M6281 Muscle weakness (generalized): Secondary | ICD-10-CM | POA: Diagnosis not present

## 2018-01-29 NOTE — Therapy (Signed)
[place] MAIN [facility] SERVICES [address] [place], [place], [address] Phone: [phone]   Fax:  [phone]  Physical Therapy Treatment  Patient Details  Name: [name] MRN: [id number] Date of Birth: Jan 14, 1930 Referring Provider (PT): Ria Bush   Encounter Date: 01/29/2018  PT End of Session - 01/29/18 1537    Visit Number  12    Number of Visits  17    Date for PT Re-Evaluation  02/01/16    Authorization Type  2/10    PT Start Time  0230    PT Stop Time  0315    PT Time Calculation (min)  45 min    Equipment Utilized During Treatment  Gait belt    Activity Tolerance  Patient tolerated treatment well;Patient limited by fatigue    Behavior During Therapy  WFL for tasks assessed/performed       Past Medical History:  Diagnosis Date  . Abdominal aortic atherosclerosis ([place]) 09/2015   by xray  . Anxiety   . BCC (basal cell carcinoma of skin) 05/2011   on nose  . Breast lesion    a. diagnosed with complex sclerosing lesion with calcifications of the right breast in 08/2016  . Chest pain    a. nuclear stress test 03/2012: normal  . DDD (degenerative disc disease), lumbosacral 12/06/1992  . Gallstones 09/2015   incidentally by xray  . GERD (gastroesophageal reflux disease) 05/1999  . HERPES ZOSTER 04/13/2007   Qualifier: Diagnosis of  By: [name], [name]   . History of shingles    ophthalmic, takes acyclovir daily  . Hyperlipidemia 12/2003  . Hypertension 1990  . Mitral regurgitation    a. echo 03/2012: EF 60-65%, no RWMA, mild to mod AI/MR, mod TR, PASP 37 mmHg  . Osteoarthritis 08/21/1989  . Osteoporosis with fracture 11/1997   with compression fracture T12  . Persistent atrial fibrillation 03/16/2012   a. CHADS2VASc => 5 (HTN, age x 2, vascular disease, sex category)-->Eliquis 5 BID;  b. Recurrent AF in 06/2016;  c. 01/2017 s/p DCCV;  d. 02/2017 recurrent AFib->Amio added->DCCV; e. 03/2017 recurrent  AFib, s/p reload of amio and DCCV 04/02/2017  . Rheumatoid arthritis ([place])   . RLS (restless legs syndrome)     Past Surgical History:  Procedure Laterality Date  . ABDOMINAL HYSTERECTOMY  Age 55 - 66   S/P TAH  . abdominal ultrasound  04/23/2004   Gallstones  . BREAST LUMPECTOMY WITH RADIOACTIVE SEED LOCALIZATION Right 10/2016   breast lumpectomy with radioactive seed localization and margin assessment for complex sclerosing lesion [facility])  . BREAST LUMPECTOMY WITH RADIOACTIVE SEED LOCALIZATION Right 11/05/2016   Procedure: RIGHT BREAST LUMPECTOMY WITH RADIOACTIVE SEED LOCALIZATION;  Surgeon: Fanny Skates, MD;  Location: [place];  Service: General;  Laterality: Right;  . CARDIOVERSION N/A 01/31/2017   Procedure: CARDIOVERSION;  Surgeon: Wellington Hampshire, MD;  Location: [facility];  Service: Cardiovascular;  Laterality: N/A;  . CARDIOVERSION N/A 03/03/2017   Procedure: CARDIOVERSION;  Surgeon: Wellington Hampshire, MD;  Location: [facility];  Service: Cardiovascular;  Laterality: N/A;  . CARDIOVERSION N/A 04/02/2017   Procedure: CARDIOVERSION;  Surgeon: Wellington Hampshire, MD;  Location: [facility];  Service: Cardiovascular;  Laterality: N/A;  . CARDIOVERSION N/A 01/16/2018   Procedure: CARDIOVERSION;  Surgeon: Wellington Hampshire, MD;  Location: [facility];  Service: Cardiovascular;  Laterality: N/A;  . CATARACT EXTRACTION  05/2010   left eye  . CERVICAL DISCECTOMY  1992  Fusion (Dr. Joya Salm)  . ESI Bilateral 01/2016   S1 transforaminal ESI x3  . ESOPHAGOGASTRODUODENOSCOPY  01/01/2002   with ulcer, bx. neg; + stricture gastric ulcer with hemorrhage  . ESOPHAGOGASTRODUODENOSCOPY  04/23/2004   Esophageal stricture -- dilated  . nuclear stress test  03/2012   no ischemia  . SKIN CANCER EXCISION  05/20/2011   BCC from nose  . US ECHOCARDIOGRAPHY  03/2012   in flutter, EF 60%, mild-mod aortic, mitral, tricuspid regurg, mildly dilated LA    There were no vitals filed for  this visit.  Subjective Assessment - 01/29/18 1527    Subjective  Patient states that her only complaint is her feet and ankles (swelling). Patient has back pain, but that it is manageable.    Patient is accompained by:  Family member    Pertinent History  Patient has been having balance defictis and has had several falls in the past year.     Patient Stated Goals  "I just want it to quit hurting."    Currently in Pain?  Yes    Pain Score  6     Pain Location  Back    Pain Onset  More than a month ago       Ther-ex Nu-step  L2 x 5 minutes during history (3 minutes unbilled); Lunge on BOSU ball x 10 BLE Tapping 6 inch stool x 20 reps BLE  Airex balance with toe taps to 6" step alternating LE x 10 each, faded UE support, pt able to perform with 2 finger support at the end of the set; Roller stick to left low back and right groin for decreasing pain.  CGA and Min  verbal cues used throughout with increased in postural sway and LOB most seen with narrow base of support and while on uneven surfaces. Continues to have balance deficits typical with diagnosis. Patient performs intermediate level exercises without pain behaviors and needs verbal cuing for postural alignment and head positioning                         PT Education - 01/29/18 1527    Education provided  Yes    Education Details  HEP    Person(s) Educated  Patient    Methods  Explanation;Demonstration;Tactile cues;Verbal cues    Comprehension  Verbalized understanding;Returned demonstration;Need further instruction       PT Short Term Goals - 01/21/18 1441      PT SHORT TERM GOAL #1   Title  Patient will be independent in home exercise program to improve strength/mobility for better functional independence with ADLs.    Time  4    Period  Weeks    Status  Achieved    Target Date  01/06/18      PT SHORT TERM GOAL #2   Title  Patient (< 22 years old) will complete five times sit to stand test in <  10 seconds indicating an increased LE strength and improved balance.    Baseline  01/21/18=UE support 41.3 s    Time  4    Period  Weeks    Status  On-going    Target Date  01/06/18        PT Long Term Goals - 01/21/18 1453      PT LONG TERM GOAL #1   Title  Patient will increase Berg Balance score by > 6 points to demonstrate decreased fall risk during functional activities.    Baseline  01/21/18= 46/56    Time  8    Period  Weeks    Status  Achieved    Target Date  02/03/18      PT LONG TERM GOAL #2   Title  Patient will increase six minute walk test distance to >1000 for progression to community ambulator and improve gait ability    Baseline  01/21/18= 537ft w/ rollator    Time  8    Period  Weeks    Status  On-going    Target Date  02/03/18      PT LONG TERM GOAL #3   Title  Patient will increase 10 meter walk test to >1.69m/s as to improve gait speed for better community ambulation and to reduce fall risk.    Baseline  01/21/18= 0.82 m/s (no AD)    Time  8    Period  Weeks    Status  On-going    Target Date  02/03/18            Plan - 01/29/18 1541    Clinical Impression Statement  Pt presents with flexed posture and decreased standing tolerance and LE weakness and requires frequent verbal cues for upright posture and education provided on relationship between posture and back pain. Patient demonstrates instability with therapeutic exercises and balance exercises but tolerates all interventions well. Patient will benefit from continued skilled PT interventions for improved balance, posture, strength, and QOL.    Rehab Potential  Good    PT Frequency  2x / week    PT Duration  8 weeks    PT Treatment/Interventions  Balance training;Neuromuscular re-education;Therapeutic exercise;Patient/family education;Manual techniques;Therapeutic activities;Moist Heat;Aquatic Therapy;Functional mobility training    PT Next Visit Plan  HEP progression, balance training,  strengthening    PT Home Exercise Plan  heel raises, sit to stand    Consulted and Agree with Plan of Care  Patient;Family member/caregiver       Patient will benefit from skilled therapeutic intervention in order to improve the following deficits and impairments:  Decreased balance, Decreased endurance, Abnormal gait, Decreased mobility, Difficulty walking, Dizziness, Pain, Impaired flexibility, Decreased strength, Decreased activity tolerance  Visit Diagnosis: No diagnosis found.     Problem List Patient Active Problem List   Diagnosis Date Noted  . Peripheral neuropathy 12/04/2017  . General unsteadiness 10/01/2017  . Constipation 10/01/2017  . Esophageal dysphagia 04/08/2017  . Chronic radicular pain of lower back 04/08/2017  . Fall 03/04/2017  . Encounter for chronic pain management 11/11/2016  . Breast mass, right 09/29/2016  . Abdominal aortic atherosclerosis ([place]) 09/07/2015  . Insomnia 06/08/2015  . Health maintenance examination 12/08/2014  . Advanced care planning/counseling discussion 12/08/2014  . Chronic leg pain 10/13/2014  . Fatigue 10/28/2013  . Persistent atrial fibrillation 04/24/2013  . Medicare annual wellness visit, subsequent 04/14/2012  . Atypical chest pain 03/16/2012  . RLS (restless legs syndrome) 02/07/2012  . DDD (degenerative disc disease), lumbar 12/18/2009  . MDD (major depressive disorder), recurrent episode ([place]) 12/10/2007  . POSTHERPETIC NEURALGIA 04/20/2007  . HLD (hyperlipidemia) 11/24/2006  . PLMD (periodic limb movement disorder) 11/24/2006  . Essential hypertension 11/24/2006  . GERD 11/24/2006  . GASTRIC ULCER 11/24/2006  . Osteoarthritis 11/24/2006  . Osteoporosis with pathological fracture 11/24/2006    Alanson Puls, PT DPT 01/29/2018, 3:42 PM  [place] MAIN [facility] SERVICES [address] [place], [place], [address] Phone: [phone]   Fax:  [phone]  Name: [name]  MRN: [id number] Date of Birth: [birth year]

## 2018-01-30 ENCOUNTER — Ambulatory Visit: Payer: Medicare Other | Admitting: Family Medicine

## 2018-01-30 ENCOUNTER — Encounter: Payer: Self-pay | Admitting: Family Medicine

## 2018-01-30 VITALS — BP 140/86 | HR 56 | Temp 97.8°F | Ht 62.0 in | Wt 130.5 lb

## 2018-01-30 DIAGNOSIS — M79605 Pain in left leg: Secondary | ICD-10-CM

## 2018-01-30 DIAGNOSIS — G6289 Other specified polyneuropathies: Secondary | ICD-10-CM | POA: Diagnosis not present

## 2018-01-30 DIAGNOSIS — M79604 Pain in right leg: Secondary | ICD-10-CM

## 2018-01-30 DIAGNOSIS — M5136 Other intervertebral disc degeneration, lumbar region: Secondary | ICD-10-CM

## 2018-01-30 DIAGNOSIS — R600 Localized edema: Secondary | ICD-10-CM | POA: Diagnosis not present

## 2018-01-30 DIAGNOSIS — R2681 Unsteadiness on feet: Secondary | ICD-10-CM | POA: Diagnosis not present

## 2018-01-30 DIAGNOSIS — G8929 Other chronic pain: Secondary | ICD-10-CM

## 2018-01-30 MED ORDER — GABAPENTIN 100 MG PO CAPS: ORAL_CAPSULE | ORAL | 3 refills | Status: DC

## 2018-01-30 NOTE — Progress Notes (Signed)
BP 140/86 (BP Location: Left Arm, Patient Position: Sitting, Cuff Size: Normal)   Pulse (!) 56   Temp 97.8 F (36.6 C) (Oral)   Ht 5\' 2"  (1.575 m)   Wt 130 lb 8 oz (59.2 kg)   SpO2 96%   BMI 23.87 kg/m    CC: f/u pedal edema Subjective:    Patient ID: [name], female    DOB: 1929/05/05, 84 y.o.   MRN: [id number]  HPI: [name] is a 84 y.o. female presenting on 01/30/2018 for Foot Swelling (Here for f/u. C/o bilateral foot swelling, left worse. Feel warm to the touch now, but are cold when going to bed. Pt accompanied by her daughter, [name]. ); Groin Pain (C/o right groin pain. ); and Hip Pain (C/o left hip pain. )   Seen last month for pedal edema with concern for mild L lateral ankle cellulitis treated with doxycyline course. She had left lateral ankle ulcer that has improved but never fully healed. ABIs adequate (noncompressible PT/DP on right, PT on left, diminished TBI bilaterally) 01/2018. During that visit she was found to be tachycardic and was evaluated by cardiology revealing atypical atrial flutter with RVR - started on metoprolol without improvement s/p cardioversion. Has trouble tolerating beta blockade due to fatigue, but continues metoprolol.  Continued bilateral lower leg pain and discoloration of feet. She also endorses pain at L lower back and R groin. Tops of feet feel burning. 2 month history of skin color change to BLE.   Has been seeing PT for balance training program with perceived benefit.   Pain regimen - tylenol 1000mg  tid, gabapentin 100mg /200mg , zanaflex 2mg  at night and tramadol 50mg  bid. Also on cymbalta 60mg  daily.  Sees Dr Sharlet Salina with B gluteal trigger point injections Q3 mo.   Relevant past medical, surgical, family and social history reviewed and updated as indicated. Interim medical history since our last visit reviewed. Allergies and medications reviewed and updated. Outpatient Medications Prior to Visit  Medication Sig Dispense  Refill  . acetaminophen (TYLENOL) 500 MG tablet Take 1,000 mg every 8 (eight) hours as needed by mouth for moderate pain.     Marland Kitchen acyclovir (ZOVIRAX) 400 MG tablet Take 400 mg 2 (two) times daily by mouth.     Marland Kitchen amiodarone (PACERONE) 200 MG tablet Take 1 tablet (200 mg total) by mouth daily. 90 tablet 3  . atorvastatin (LIPITOR) 20 MG tablet TAKE 1 TABLET DAILY 90 tablet 0  . DULoxetine (CYMBALTA) 60 MG capsule Take 1 capsule (60 mg total) by mouth daily. 30 capsule 3  . ELIQUIS 2.5 MG TABS tablet TAKE 1 TABLET BY MOUTH TWICE DAILY 180 tablet 1  . metoprolol tartrate (LOPRESSOR) 25 MG tablet Take 1 tablet (25 mg total) by mouth 2 (two) times daily. 180 tablet 3  . pantoprazole (PROTONIX) 40 MG tablet TAKE 1 TABLET BY MOUTH DAILY (Patient taking differently: Take 40 mg by mouth daily. ) 30 tablet 2  . prednisoLONE acetate (PRED FORTE) 1 % ophthalmic suspension Place 1 drop into the left eye daily.     Marland Kitchen tiZANidine (ZANAFLEX) 2 MG tablet Take 2 mg by mouth every evening.     . traMADol (ULTRAM) 50 MG tablet Take 1 tablet (50 mg total) by mouth 2 (two) times daily.  0  . Vitamin D, Ergocalciferol, (DRISDOL) 50000 units CAPS capsule TAKE 1 CAPSULE BY MOUTH ONCE WEEKLY (Patient taking differently: Take 50,000 Units by mouth every Saturday. ) 12 capsule 1  .  gabapentin (NEURONTIN) 100 MG capsule TAKE 1 CAPSULE EVERY MORNING AND TAKE 2 CAPSULES EVERY EVENING (Patient taking differently: Take 100-200 mg by mouth See admin instructions. Take 100 mg by mouth in the morning and take 200 mg by mouth in the evening) 90 capsule 3  . rOPINIRole (REQUIP) 1 MG tablet Take 1 tablet (1 mg total) by mouth at bedtime. (Patient taking differently: Take 1 mg by mouth at bedtime. Takes 2 tablets at bedtime) 180 tablet 0   No facility-administered medications prior to visit.      Per HPI unless specifically indicated in ROS section below Review of Systems     Objective:    BP 140/86 (BP Location: Left Arm, Patient  Position: Sitting, Cuff Size: Normal)   Pulse (!) 56   Temp 97.8 F (36.6 C) (Oral)   Ht 5\' 2"  (1.575 m)   Wt 130 lb 8 oz (59.2 kg)   SpO2 96%   BMI 23.87 kg/m   Wt Readings from Last 3 Encounters:  01/30/18 130 lb 8 oz (59.2 kg)  01/16/18 129 lb (58.5 kg)  01/12/18 128 lb 8 oz (58.3 kg)    Physical Exam  Constitutional: She appears well-developed and well-nourished. No distress.  Musculoskeletal: Normal range of motion. She exhibits no edema (no pitting edema).  Point tender at L sacroiliac joint Tender to palpation bilateral lower legs below calves 1+ DP bilaterally Purplish hue to bilateral lower legs below ankles  Neurological: She is alert. No sensory deficit.  Sensation intact to light touch BLE  Skin: Skin is dry. Capillary refill takes less than 2 seconds. Lesion (L lateral ankle) noted. No bruising, no ecchymosis and no rash noted. No erythema.  Cool extremities  Nursing note and vitals reviewed.  Results for orders placed or performed in visit on 01/30/18  Sedimentation rate  Result Value Ref Range   Sed Rate 9 0 - 30 mm/h  CBC with Differential/Platelet  Result Value Ref Range   WBC 7.1 3.8 - 10.8 Thousand/uL   RBC 3.82 3.80 - 5.10 Million/uL   Hemoglobin 12.6 11.7 - 15.5 g/dL   HCT 36.0 35.0 - 45.0 %   MCV 94.2 80.0 - 100.0 fL   MCH 33.0 27.0 - 33.0 pg   MCHC 35.0 32.0 - 36.0 g/dL   RDW 12.8 11.0 - 15.0 %   Platelets 241 140 - 400 Thousand/uL   MPV 10.0 7.5 - 12.5 fL   Neutro Abs 4,913 1,500 - 7,800 cells/uL   Lymphs Abs 1,299 850 - 3,900 cells/uL   WBC mixed population 639 200 - 950 cells/uL   Eosinophils Absolute 170 15 - 500 cells/uL   Basophils Absolute 78 0 - 200 cells/uL   Neutrophils Relative % 69.2 %   Total Lymphocyte 18.3 %   Monocytes Relative 9.0 %   Eosinophils Relative 2.4 %   Basophils Relative 1.1 %  Uric acid  Result Value Ref Range   Uric Acid, Serum 2.7 2.5 - 7.0 mg/dL  Basic metabolic panel  Result Value Ref Range   Glucose,  Bld 90 65 - 99 mg/dL   BUN 15 7 - 25 mg/dL   Creat 0.81 0.60 - 0.88 mg/dL   BUN/Creatinine Ratio NOT APPLICABLE 6 - 22 (calc)   Sodium 144 135 - 146 mmol/L   Potassium 4.0 3.5 - 5.3 mmol/L   Chloride 105 98 - 110 mmol/L   CO2 30 20 - 32 mmol/L   Calcium 9.4 8.6 - 10.4 mg/dL  Assessment & Plan:   Problem List Items Addressed This Visit    Peripheral neuropathy - Primary    Anticipate worsening neuropathy largely contributing to ongoing pain.  Will slowly increase gabapentin to 100mg  BID with 200mg  at bedtime, with option to continue titration.      Relevant Medications   gabapentin (NEURONTIN) 100 MG capsule   rOPINIRole (REQUIP) 1 MG tablet   Other Relevant Orders   Sedimentation rate (Completed)   CBC with Differential/Platelet (Completed)   Uric acid (Completed)   Basic metabolic panel (Completed)   General unsteadiness    Continues balance training PT with perceived benefit.       DDD (degenerative disc disease), lumbar    I think her lumbar disc disease contributes to her chronic leg pain. Unfortunately treatment options are limited. Continue PMR f/u with steroid injections Q34mo      Chronic leg pain    H/o R crushing injury, but recent worsening pain likely neuropathic in nature. Will slowly taper gabapentin, monitoring for increasing unsteadiness.  Check for gout, inflammation today. Recent ABIs were normal (however abnormal TBIs bilaterally).  Pain not consistent with chronic venous insufficiency but could trial compression stockings - will await labs.       Relevant Medications   gabapentin (NEURONTIN) 100 MG capsule    Other Visit Diagnoses    Pedal edema       Relevant Orders   Uric acid (Completed)       Meds ordered this encounter  Medications  . gabapentin (NEURONTIN) 100 MG capsule    Sig: TAKE 1 CAP IN AM 1 IN AFTERNOON AND 2 AT NIGHT    Dispense:  120 capsule    Refill:  3  . rOPINIRole (REQUIP) 1 MG tablet    Sig: Take 2 tablets (2 mg  total) by mouth at bedtime.    Dispense:  180 tablet    Refill:  0   Orders Placed This Encounter  Procedures  . Sedimentation rate  . CBC with Differential/Platelet  . Uric acid  . Basic metabolic panel    Follow up plan: No follow-ups on file.  Ria Bush, MD

## 2018-01-30 NOTE — Patient Instructions (Signed)
Increase gabapentin to 1 in am, 1 in pm and 2 at night Labs today Wait for lab results, but prescription for compression stocking provided today.

## 2018-01-31 LAB — BASIC METABOLIC PANEL
BUN: 15 mg/dL (ref 7–25)
CO2: 30 mmol/L (ref 20–32)
Calcium: 9.4 mg/dL (ref 8.6–10.4)
Chloride: 105 mmol/L (ref 98–110)
Creat: 0.81 mg/dL (ref 0.60–0.88)
Glucose, Bld: 90 mg/dL (ref 65–99)
POTASSIUM: 4 mmol/L (ref 3.5–5.3)
SODIUM: 144 mmol/L (ref 135–146)

## 2018-01-31 LAB — CBC WITH DIFFERENTIAL/PLATELET
Basophils Absolute: 78 cells/uL (ref 0–200)
Basophils Relative: 1.1 %
EOS ABS: 170 {cells}/uL (ref 15–500)
Eosinophils Relative: 2.4 %
HEMATOCRIT: 36 % (ref 35.0–45.0)
HEMOGLOBIN: 12.6 g/dL (ref 11.7–15.5)
Lymphs Abs: 1299 cells/uL (ref 850–3900)
MCH: 33 pg (ref 27.0–33.0)
MCHC: 35 g/dL (ref 32.0–36.0)
MCV: 94.2 fL (ref 80.0–100.0)
MONOS PCT: 9 %
MPV: 10 fL (ref 7.5–12.5)
NEUTROS ABS: 4913 {cells}/uL (ref 1500–7800)
Neutrophils Relative %: 69.2 %
Platelets: 241 10*3/uL (ref 140–400)
RBC: 3.82 10*6/uL (ref 3.80–5.10)
RDW: 12.8 % (ref 11.0–15.0)
Total Lymphocyte: 18.3 %
WBC mixed population: 639 cells/uL (ref 200–950)
WBC: 7.1 10*3/uL (ref 3.8–10.8)

## 2018-01-31 LAB — URIC ACID: Uric Acid, Serum: 2.7 mg/dL (ref 2.5–7.0)

## 2018-01-31 LAB — SEDIMENTATION RATE: Sed Rate: 9 mm/h (ref 0–30)

## 2018-01-31 MED ORDER — ROPINIROLE HCL 1 MG PO TABS: 2.0000 mg | ORAL_TABLET | Freq: Every day | ORAL | 0 refills | Status: DC

## 2018-01-31 NOTE — Assessment & Plan Note (Signed)
Continues balance training PT with perceived benefit.

## 2018-01-31 NOTE — Assessment & Plan Note (Signed)
I think her lumbar disc disease contributes to her chronic leg pain. Unfortunately treatment options are limited. Continue PMR f/u with steroid injections Q43mo

## 2018-01-31 NOTE — Assessment & Plan Note (Signed)
Anticipate worsening neuropathy largely contributing to ongoing pain.  Will slowly increase gabapentin to 100mg  BID with 200mg  at bedtime, with option to continue titration.

## 2018-01-31 NOTE — Assessment & Plan Note (Addendum)
H/o R crushing injury, but recent worsening pain likely neuropathic in nature. Will slowly taper gabapentin, monitoring for increasing unsteadiness.  Check for gout, inflammation today. Recent ABIs were normal (however abnormal TBIs bilaterally).  Pain not consistent with chronic venous insufficiency but could trial compression stockings - will await labs.

## 2018-02-02 NOTE — Addendum Note (Signed)
Addended by: Lurlean Nanny on: 02/02/2018 04:44 PM   Modules accepted: Orders

## 2018-02-03 ENCOUNTER — Ambulatory Visit: Payer: Medicare Other | Admitting: Physical Therapy

## 2018-02-03 ENCOUNTER — Encounter: Payer: Self-pay | Admitting: Physical Therapy

## 2018-02-03 DIAGNOSIS — R269 Unspecified abnormalities of gait and mobility: Secondary | ICD-10-CM

## 2018-02-03 DIAGNOSIS — M6281 Muscle weakness (generalized): Secondary | ICD-10-CM | POA: Diagnosis not present

## 2018-02-03 DIAGNOSIS — R2681 Unsteadiness on feet: Secondary | ICD-10-CM | POA: Diagnosis not present

## 2018-02-03 NOTE — Therapy (Signed)
[name] MAIN [facility] SERVICES [address] [place], [place], [address] Phone: [phone]   Fax:  [phone]  Physical Therapy Treatment  Patient Details  Name: CELISA SCHOENBERG MRN: [id number] Date of Birth: November 23, 1929 Referring Provider (PT): Ria Bush   Encounter Date: 02/03/2018  PT End of Session - 02/03/18 1509    Visit Number  13    Number of Visits  17    Date for PT Re-Evaluation  02/01/16    Authorization Type  3/10    PT Start Time  0230    PT Stop Time  0310    PT Time Calculation (min)  40 min    Equipment Utilized During Treatment  Gait belt    Activity Tolerance  Patient tolerated treatment well;Patient limited by fatigue    Behavior During Therapy  WFL for tasks assessed/performed       Past Medical History:  Diagnosis Date  . Abdominal aortic atherosclerosis ([place]) 09/2015   by xray  . Anxiety   . BCC (basal cell carcinoma of skin) 05/2011   on nose  . Breast lesion    a. diagnosed with complex sclerosing lesion with calcifications of the right breast in 08/2016  . Chest pain    a. nuclear stress test 03/2012: normal  . DDD (degenerative disc disease), lumbosacral 12/06/1992  . Gallstones 09/2015   incidentally by xray  . GERD (gastroesophageal reflux disease) 05/1999  . HERPES ZOSTER 04/13/2007   Qualifier: Diagnosis of  By: [name], [name]   . History of shingles    ophthalmic, takes acyclovir daily  . Hyperlipidemia 12/2003  . Hypertension 1990  . Mitral regurgitation    a. echo 03/2012: EF 60-65%, no RWMA, mild to mod AI/MR, mod TR, PASP 37 mmHg  . Osteoarthritis 08/21/1989  . Osteoporosis with fracture 11/1997   with compression fracture T12  . Persistent atrial fibrillation 03/16/2012   a. CHADS2VASc => 5 (HTN, age x 2, vascular disease, sex category)-->Eliquis 5 BID;  b. Recurrent AF in 06/2016;  c. 01/2017 s/p DCCV;  d. 02/2017 recurrent AFib->Amio added->DCCV; e. 03/2017 recurrent  AFib, s/p reload of amio and DCCV 04/02/2017  . Rheumatoid arthritis ([place])   . RLS (restless legs syndrome)     Past Surgical History:  Procedure Laterality Date  . ABDOMINAL HYSTERECTOMY  Age 86 - 55   S/P TAH  . abdominal ultrasound  04/23/2004   Gallstones  . BREAST LUMPECTOMY WITH RADIOACTIVE SEED LOCALIZATION Right 10/2016   breast lumpectomy with radioactive seed localization and margin assessment for complex sclerosing lesion [facility])  . BREAST LUMPECTOMY WITH RADIOACTIVE SEED LOCALIZATION Right 11/05/2016   Procedure: RIGHT BREAST LUMPECTOMY WITH RADIOACTIVE SEED LOCALIZATION;  Surgeon: Fanny Skates, MD;  Location: [place];  Service: General;  Laterality: Right;  . CARDIOVERSION N/A 01/31/2017   Procedure: CARDIOVERSION;  Surgeon: Wellington Hampshire, MD;  Location: [facility];  Service: Cardiovascular;  Laterality: N/A;  . CARDIOVERSION N/A 03/03/2017   Procedure: CARDIOVERSION;  Surgeon: Wellington Hampshire, MD;  Location: [place] [facility];  Service: Cardiovascular;  Laterality: N/A;  . CARDIOVERSION N/A 04/02/2017   Procedure: CARDIOVERSION;  Surgeon: Wellington Hampshire, MD;  Location: [facility];  Service: Cardiovascular;  Laterality: N/A;  . CARDIOVERSION N/A 01/16/2018   Procedure: CARDIOVERSION;  Surgeon: Wellington Hampshire, MD;  Location: [facility];  Service: Cardiovascular;  Laterality: N/A;  . CATARACT EXTRACTION  05/2010   left eye  . CERVICAL DISCECTOMY  1992  Fusion (Dr. Joya Salm)  . ESI Bilateral 01/2016   S1 transforaminal ESI x3  . ESOPHAGOGASTRODUODENOSCOPY  01/01/2002   with ulcer, bx. neg; + stricture gastric ulcer with hemorrhage  . ESOPHAGOGASTRODUODENOSCOPY  04/23/2004   Esophageal stricture -- dilated  . nuclear stress test  03/2012   no ischemia  . SKIN CANCER EXCISION  05/20/2011   BCC from nose  . US ECHOCARDIOGRAPHY  03/2012   in flutter, EF 60%, mild-mod aortic, mitral, tricuspid regurg, mildly dilated LA    There were no vitals filed for  this visit.  Subjective Assessment - 02/03/18 1507    Subjective  Patient states that her only complaint is her feet and ankles (swelling). Patient has back pain, but that it is manageable.    Patient is accompained by:  Family member    Pertinent History  Patient has been having balance defictis and has had several falls in the past year.     Patient Stated Goals  "I just want it to quit hurting."    Pain Onset  More than a month ago         Treatment Manual therapy:  Roller stick to low back for pain control to relax low back muscles x 15 mins   Therapeutic exercise:  Nu-step x 5 mins at 50 SPM level 2 Knee to chest stretch x 30 sec x 3 BLE Bridges x 15 hooklying marching x 15  hooklying ER with RTB  x 30 x 2  Supine SLR with 2 lbs X 10 x 2  BLE Leg press x 60 lbs x 20, Heel raises x 15 x 3  CGA and Min to mod verbal cues used throughout for correct technique.  Pain is 1/10 following treatment.                      PT Education - 02/03/18 1508    Education provided  Yes    Education Details  HEP    Person(s) Educated  Patient    Methods  Explanation    Comprehension  Verbalized understanding;Returned demonstration       PT Short Term Goals - 01/21/18 1441      PT SHORT TERM GOAL #1   Title  Patient will be independent in home exercise program to improve strength/mobility for better functional independence with ADLs.    Time  4    Period  Weeks    Status  Achieved    Target Date  01/06/18      PT SHORT TERM GOAL #2   Title  Patient (< 41 years old) will complete five times sit to stand test in < 10 seconds indicating an increased LE strength and improved balance.    Baseline  01/21/18=UE support 41.3 s    Time  4    Period  Weeks    Status  On-going    Target Date  01/06/18        PT Long Term Goals - 01/21/18 1453      PT LONG TERM GOAL #1   Title  Patient will increase Berg Balance score by > 6 points to demonstrate decreased  fall risk during functional activities.    Baseline  01/21/18= 46/56    Time  8    Period  Weeks    Status  Achieved    Target Date  02/03/18      PT LONG TERM GOAL #2   Title  Patient will increase six minute  walk test distance to >1000 for progression to community ambulator and improve gait ability    Baseline  01/21/18= 545ft w/ rollator    Time  8    Period  Weeks    Status  On-going    Target Date  02/03/18      PT LONG TERM GOAL #3   Title  Patient will increase 10 meter walk test to >1.23m/s as to improve gait speed for better community ambulation and to reduce fall risk.    Baseline  01/21/18= 0.82 m/s (no AD)    Time  8    Period  Weeks    Status  On-going    Target Date  02/03/18            Plan - 02/03/18 1510    Clinical Impression Statement  Pt continues to present with flexed posture and decreased standing tolerance and LE weakness and requires frequent verbal cues for upright posture and education provided on relationship between posture and back pain. Patient demonstrates instability with therapeutic exercises and balance exercises but tolerates all interventions well. Patient will benefit from continued skilled PT interventions for improved balance, posture, strength, and QOL.    Rehab Potential  Good    PT Frequency  2x / week    PT Duration  8 weeks    PT Treatment/Interventions  Balance training;Neuromuscular re-education;Therapeutic exercise;Patient/family education;Manual techniques;Therapeutic activities;Moist Heat;Aquatic Therapy;Functional mobility training    PT Next Visit Plan  HEP progression, balance training, strengthening    PT Home Exercise Plan  heel raises, sit to stand    Consulted and Agree with Plan of Care  Patient;Family member/caregiver       Patient will benefit from skilled therapeutic intervention in order to improve the following deficits and impairments:  Decreased balance, Decreased endurance, Abnormal gait, Decreased mobility,  Difficulty walking, Dizziness, Pain, Impaired flexibility, Decreased strength, Decreased activity tolerance  Visit Diagnosis: Unsteadiness on feet  Abnormality of gait and mobility  Muscle weakness (generalized)     Problem List Patient Active Problem List   Diagnosis Date Noted  . Peripheral neuropathy 12/04/2017  . General unsteadiness 10/01/2017  . Constipation 10/01/2017  . Esophageal dysphagia 04/08/2017  . Chronic radicular pain of lower back 04/08/2017  . Fall 03/04/2017  . Encounter for chronic pain management 11/11/2016  . Breast mass, right 09/29/2016  . Abdominal aortic atherosclerosis ([place]) 09/07/2015  . Insomnia 06/08/2015  . Health maintenance examination 12/08/2014  . Advanced care planning/counseling discussion 12/08/2014  . Chronic leg pain 10/13/2014  . Fatigue 10/28/2013  . Persistent atrial fibrillation 04/24/2013  . Medicare annual wellness visit, subsequent 04/14/2012  . Atypical chest pain 03/16/2012  . RLS (restless legs syndrome) 02/07/2012  . DDD (degenerative disc disease), lumbar 12/18/2009  . MDD (major depressive disorder), recurrent episode ([place]) 12/10/2007  . POSTHERPETIC NEURALGIA 04/20/2007  . HLD (hyperlipidemia) 11/24/2006  . PLMD (periodic limb movement disorder) 11/24/2006  . Essential hypertension 11/24/2006  . GERD 11/24/2006  . GASTRIC ULCER 11/24/2006  . Osteoarthritis 11/24/2006  . Osteoporosis with pathological fracture 11/24/2006    Alanson Puls, PT DPT 02/03/2018, 3:11 PM  [place] MAIN [facility] SERVICES [address] [place], [place], [address] Phone: [phone]   Fax:  [phone]  Name: MAGUIRE KILLMER MRN: [id number] Date of Birth: 14-Jan-1930

## 2018-02-05 ENCOUNTER — Ambulatory Visit: Payer: Medicare Other | Admitting: Physical Therapy

## 2018-02-08 ENCOUNTER — Other Ambulatory Visit: Payer: Self-pay | Admitting: Family Medicine

## 2018-02-08 DIAGNOSIS — M8000XD Age-related osteoporosis with current pathological fracture, unspecified site, subsequent encounter for fracture with routine healing: Secondary | ICD-10-CM

## 2018-02-08 DIAGNOSIS — I4819 Other persistent atrial fibrillation: Secondary | ICD-10-CM

## 2018-02-08 DIAGNOSIS — E785 Hyperlipidemia, unspecified: Secondary | ICD-10-CM

## 2018-02-10 ENCOUNTER — Other Ambulatory Visit: Payer: Self-pay | Admitting: Family Medicine

## 2018-02-11 ENCOUNTER — Encounter: Payer: Self-pay | Admitting: Physical Therapy

## 2018-02-11 ENCOUNTER — Ambulatory Visit: Payer: Medicare Other | Attending: Family Medicine | Admitting: Physical Therapy

## 2018-02-11 ENCOUNTER — Other Ambulatory Visit (INDEPENDENT_AMBULATORY_CARE_PROVIDER_SITE_OTHER): Payer: Medicare Other

## 2018-02-11 DIAGNOSIS — R269 Unspecified abnormalities of gait and mobility: Secondary | ICD-10-CM | POA: Diagnosis not present

## 2018-02-11 DIAGNOSIS — E785 Hyperlipidemia, unspecified: Secondary | ICD-10-CM | POA: Diagnosis not present

## 2018-02-11 DIAGNOSIS — R2681 Unsteadiness on feet: Secondary | ICD-10-CM | POA: Diagnosis not present

## 2018-02-11 DIAGNOSIS — M6281 Muscle weakness (generalized): Secondary | ICD-10-CM | POA: Insufficient documentation

## 2018-02-11 DIAGNOSIS — M8000XD Age-related osteoporosis with current pathological fracture, unspecified site, subsequent encounter for fracture with routine healing: Secondary | ICD-10-CM

## 2018-02-11 LAB — LIPID PANEL
Cholesterol: 135 mg/dL (ref 0–200)
HDL: 59.2 mg/dL (ref >39.00)
LDL Cholesterol: 53 mg/dL (ref 0–99)
NONHDL: 75.95
Total CHOL/HDL Ratio: 2
Triglycerides: 113 mg/dL (ref 0.0–149.0)
VLDL: 22.6 mg/dL (ref 0.0–40.0)

## 2018-02-11 LAB — CALCIUM: CALCIUM: 9.3 mg/dL (ref 8.4–10.5)

## 2018-02-11 NOTE — Therapy (Signed)
[place] MAIN [facility] SERVICES [address] [place], [place], [address] Phone: [phone]   Fax:  [phone]  Physical Therapy Treatment  Patient Details  Name: [name] MRN: [id number] Date of Birth: 1929-09-03 Referring Provider (PT): Ria Bush   Encounter Date: 02/11/2018  PT End of Session - 02/11/18 1327    Visit Number  14    Number of Visits  17    Date for PT Re-Evaluation  02/01/16    Authorization Type  4/10    PT Start Time  0108    PT Stop Time  0146    PT Time Calculation (min)  38 min    Equipment Utilized During Treatment  Gait belt    Activity Tolerance  Patient tolerated treatment well;Patient limited by fatigue    Behavior During Therapy  [facility] for tasks assessed/performed       Past Medical History:  Diagnosis Date  . Abdominal aortic atherosclerosis ([place]) 09/2015   by xray  . Anxiety   . BCC (basal cell carcinoma of skin) 05/2011   on nose  . Breast lesion    a. diagnosed with complex sclerosing lesion with calcifications of the right breast in 08/2016  . Chest pain    a. nuclear stress test 03/2012: normal  . DDD (degenerative disc disease), lumbosacral 12/06/1992  . Gallstones 09/2015   incidentally by xray  . GERD (gastroesophageal reflux disease) 05/1999  . HERPES ZOSTER 04/13/2007   Qualifier: Diagnosis of  By: [name], [name]   . History of shingles    ophthalmic, takes acyclovir daily  . Hyperlipidemia 12/2003  . Hypertension 1990  . Mitral regurgitation    a. echo 03/2012: EF 60-65%, no RWMA, mild to mod AI/MR, mod TR, PASP 37 mmHg  . Osteoarthritis 08/21/1989  . Osteoporosis with fracture 11/1997   with compression fracture T12  . Persistent atrial fibrillation 03/16/2012   a. CHADS2VASc => 5 (HTN, age x 2, vascular disease, sex category)-->Eliquis 5 BID;  b. Recurrent AF in 06/2016;  c. 01/2017 s/p DCCV;  d. 02/2017 recurrent AFib->Amio added->DCCV; e. 03/2017 recurrent  AFib, s/p reload of amio and DCCV 04/02/2017  . Rheumatoid arthritis ([place])   . RLS (restless legs syndrome)     Past Surgical History:  Procedure Laterality Date  . ABDOMINAL HYSTERECTOMY  Age 28 - 59   S/P TAH  . abdominal ultrasound  04/23/2004   Gallstones  . BREAST LUMPECTOMY WITH RADIOACTIVE SEED LOCALIZATION Right 10/2016   breast lumpectomy with radioactive seed localization and margin assessment for complex sclerosing lesion [facility])  . BREAST LUMPECTOMY WITH RADIOACTIVE SEED LOCALIZATION Right 11/05/2016   Procedure: RIGHT BREAST LUMPECTOMY WITH RADIOACTIVE SEED LOCALIZATION;  Surgeon: Fanny Skates, MD;  Location: [place];  Service: General;  Laterality: Right;  . CARDIOVERSION N/A 01/31/2017   Procedure: CARDIOVERSION;  Surgeon: Wellington Hampshire, MD;  Location: [facility];  Service: Cardiovascular;  Laterality: N/A;  . CARDIOVERSION N/A 03/03/2017   Procedure: CARDIOVERSION;  Surgeon: Wellington Hampshire, MD;  Location: [place] [facility];  Service: Cardiovascular;  Laterality: N/A;  . CARDIOVERSION N/A 04/02/2017   Procedure: CARDIOVERSION;  Surgeon: Wellington Hampshire, MD;  Location: [facility];  Service: Cardiovascular;  Laterality: N/A;  . CARDIOVERSION N/A 01/16/2018   Procedure: CARDIOVERSION;  Surgeon: Wellington Hampshire, MD;  Location: [facility];  Service: Cardiovascular;  Laterality: N/A;  . CATARACT EXTRACTION  05/2010   left eye  . CERVICAL DISCECTOMY  1992  Fusion (Dr. Joya Salm)  . ESI Bilateral 01/2016   S1 transforaminal ESI x3  . ESOPHAGOGASTRODUODENOSCOPY  01/01/2002   with ulcer, bx. neg; + stricture gastric ulcer with hemorrhage  . ESOPHAGOGASTRODUODENOSCOPY  04/23/2004   Esophageal stricture -- dilated  . nuclear stress test  03/2012   no ischemia  . SKIN CANCER EXCISION  05/20/2011   BCC from nose  . US ECHOCARDIOGRAPHY  03/2012   in flutter, EF 60%, mild-mod aortic, mitral, tricuspid regurg, mildly dilated LA    There were no vitals filed for  this visit.  Subjective Assessment - 02/11/18 1325    Subjective  Patient is having increased back pain today 4/10 .     Patient is accompained by:  Family member    Pertinent History  Patient has been having balance defictis and has had several falls in the past year.     Patient Stated Goals  "I just want it to quit hurting."    Currently in Pain?  Yes    Pain Score  4     Pain Location  Back    Pain Orientation  Lower    Pain Descriptors / Indicators  Sharp    Pain Onset  More than a month ago    Pain Frequency  Constant    Aggravating Factors   sit to stand makes it hurt    Pain Relieving Factors  heat and medicine    Effect of Pain on Daily Activities  unable to perform exercises    Multiple Pain Sites  No          Treatment Manual therapy:  Roller stick to low back for pain control to relax low back muscles x 15 mins   Therapeutic exercise:  Nu-step x 5 mins at 50 SPM level 2 Knee to chest stretch x 30 sec x 3 BLE Bridges x 15 hooklying marching x 15  hooklyingER with RTBx 30 x 2 Supine SLRwith 2 lbsX 10 x 2BLE Leg press x 60 lbs x 20,Heel raises x 15x 3 CGA and Min to mod verbal cues used throughout for correct technique.  Pain is 1/10 following treatment.                       PT Education - 02/11/18 1326    Education provided  Yes    Education Details  Explanation; Demonstration; Tactile cues; Verbal cues    Person(s) Educated  Patient    Methods  Explanation    Comprehension  Verbalized understanding;Returned demonstration;Need further instruction       PT Short Term Goals - 01/21/18 1441      PT SHORT TERM GOAL #1   Title  Patient will be independent in home exercise program to improve strength/mobility for better functional independence with ADLs.    Time  4    Period  Weeks    Status  Achieved    Target Date  01/06/18      PT SHORT TERM GOAL #2   Title  Patient (< 58 years old) will complete five times sit  to stand test in < 10 seconds indicating an increased LE strength and improved balance.    Baseline  01/21/18=UE support 41.3 s    Time  4    Period  Weeks    Status  On-going    Target Date  01/06/18        PT Long Term Goals - 01/21/18 1453  PT LONG TERM GOAL #1   Title  Patient will increase Berg Balance score by > 6 points to demonstrate decreased fall risk during functional activities.    Baseline  01/21/18= 46/56    Time  8    Period  Weeks    Status  Achieved    Target Date  02/03/18      PT LONG TERM GOAL #2   Title  Patient will increase six minute walk test distance to >1000 for progression to community ambulator and improve gait ability    Baseline  01/21/18= 520ft w/ rollator    Time  8    Period  Weeks    Status  On-going    Target Date  02/03/18      PT LONG TERM GOAL #3   Title  Patient will increase 10 meter walk test to >1.24m/s as to improve gait speed for better community ambulation and to reduce fall risk.    Baseline  01/21/18= 0.82 m/s (no AD)    Time  8    Period  Weeks    Status  On-going    Target Date  02/03/18            Plan - 02/11/18 1328    Clinical Impression Statement  Patient limited in session today due to pain in back and exhaustion from pain and fatigue and weakness. Unable to perform standing exercises  today due to pain and weakness, but was able to perform supine and seated exercises with no increased pain .  Patient will continue to benefit from skilled physical therapy to improve pain and mobility.    Rehab Potential  Good    PT Frequency  2x / week    PT Duration  8 weeks    PT Treatment/Interventions  Balance training;Neuromuscular re-education;Therapeutic exercise;Patient/family education;Manual techniques;Therapeutic activities;Moist Heat;Aquatic Therapy;Functional mobility training    PT Next Visit Plan  HEP progression, balance training, strengthening    PT Home Exercise Plan  heel raises, sit to stand    Consulted  and Agree with Plan of Care  Patient;Family member/caregiver       Patient will benefit from skilled therapeutic intervention in order to improve the following deficits and impairments:  Decreased balance, Decreased endurance, Abnormal gait, Decreased mobility, Difficulty walking, Dizziness, Pain, Impaired flexibility, Decreased strength, Decreased activity tolerance  Visit Diagnosis: Unsteadiness on feet  Abnormality of gait and mobility  Muscle weakness (generalized)     Problem List Patient Active Problem List   Diagnosis Date Noted  . Peripheral neuropathy 12/04/2017  . General unsteadiness 10/01/2017  . Constipation 10/01/2017  . Esophageal dysphagia 04/08/2017  . Chronic radicular pain of lower back 04/08/2017  . Fall 03/04/2017  . Encounter for chronic pain management 11/11/2016  . Breast mass, right 09/29/2016  . Abdominal aortic atherosclerosis ([place]) 09/07/2015  . Insomnia 06/08/2015  . Health maintenance examination 12/08/2014  . Advanced care planning/counseling discussion 12/08/2014  . Chronic leg pain 10/13/2014  . Fatigue 10/28/2013  . Persistent atrial fibrillation 04/24/2013  . Medicare annual wellness visit, subsequent 04/14/2012  . Atypical chest pain 03/16/2012  . RLS (restless legs syndrome) 02/07/2012  . DDD (degenerative disc disease), lumbar 12/18/2009  . MDD (major depressive disorder), recurrent episode ([place]) 12/10/2007  . POSTHERPETIC NEURALGIA 04/20/2007  . HLD (hyperlipidemia) 11/24/2006  . PLMD (periodic limb movement disorder) 11/24/2006  . Essential hypertension 11/24/2006  . GERD 11/24/2006  . GASTRIC ULCER 11/24/2006  . Osteoarthritis 11/24/2006  . Osteoporosis with pathological fracture  11/24/2006    Alanson Puls, PT DPT 02/11/2018, 1:29 PM  [place] MAIN [facility] SERVICES [address] [place], [place], [address] Phone: [phone]   Fax:  [phone]  Name: [name] MRN:  [id number] Date of Birth: 1930/02/28

## 2018-02-12 LAB — VITAMIN D 25 HYDROXY (VIT D DEFICIENCY, FRACTURES): VITD: 52.06 ng/mL (ref 30.00–100.00)

## 2018-02-16 ENCOUNTER — Telehealth: Payer: Self-pay | Admitting: Physician Assistant

## 2018-02-16 NOTE — Telephone Encounter (Signed)
S/w daughter and she verbalized understanding to keep appointment on 11/21.

## 2018-02-16 NOTE — Progress Notes (Deleted)
Cardiology Office Note Date:  02/16/2018  Patient ID:  [name] 06-03-1929, MRN [id number] PCP:  Ria Bush, MD  Cardiologist:  Dr. Fletcher Anon, MD  ***refresh   Chief Complaint: Follow up  History of Present Illness: [name] is a 84 y.o. female with history of persistent Afib s/p DCCV 03/2017 with recent diagnosis of atrial flutter on Eliquis, HTN, HLD, RLS, RA, DDD, anxiety, and GERD who presents for follow up of recent DCCV.   Prior nuclear stress test in 03/2012 showed no evidence of ischemia. Echo in 11/2016 showed normal LVEF, mild MR, and a mildly dilated left atrium measuring 37 mm. She previously underwent DCCV in 03/2017 for Afib following amiodarone loading with metoprolol being discontinued secondary to bradycardia.  She was seen in late 12/2017 for a ulceration of the left ankle and was evaluated with a lower extremity arterial Doppler that showed normal ABI. However, she was noted to be tachycardic and an EKG was performed which showed new onset atypical atrial flutter. She noted palpitations and fatigue. She was evaluated by Dr. Saunders Revel and metoprolol was resumed at 25 mg bid. She was evaluated by Dr. Fletcher Anon on 01/12/2018, and remained in rate-controlled atrial flutter. Recent labs showed normal thyroid function, CMET, and CBC. She was noted to have been compliant with her Eliquis. She presented for DCCV on 01/16/2018 (holding metoprolol on the morning of the procedure secondary to history of bradycardia when in sinus). However, upon arrival for the procedure, she was noted to have been in sinus rhythm. DCCV was cancelled and she was continued on current medications.   ***  Past Medical History:  Diagnosis Date  . Abdominal aortic atherosclerosis ([place]) 09/2015   by xray  . Anxiety   . BCC (basal cell carcinoma of skin) 05/2011   on nose  . Breast lesion    a. diagnosed with complex sclerosing lesion with calcifications of the right breast in 08/2016  . Chest pain     a. nuclear stress test 03/2012: normal  . DDD (degenerative disc disease), lumbosacral 12/06/1992  . Gallstones 09/2015   incidentally by xray  . GERD (gastroesophageal reflux disease) 05/1999  . HERPES ZOSTER 04/13/2007   Qualifier: Diagnosis of  By: [name], [name]   . History of shingles    ophthalmic, takes acyclovir daily  . Hyperlipidemia 12/2003  . Hypertension 1990  . Mitral regurgitation    a. echo 03/2012: EF 60-65%, no RWMA, mild to mod AI/MR, mod TR, PASP 37 mmHg  . Osteoarthritis 08/21/1989  . Osteoporosis with fracture 11/1997   with compression fracture T12  . Persistent atrial fibrillation 03/16/2012   a. CHADS2VASc => 5 (HTN, age x 2, vascular disease, sex category)-->Eliquis 5 BID;  b. Recurrent AF in 06/2016;  c. 01/2017 s/p DCCV;  d. 02/2017 recurrent AFib->Amio added->DCCV; e. 03/2017 recurrent AFib, s/p reload of amio and DCCV 04/02/2017  . Rheumatoid arthritis ([place])   . RLS (restless legs syndrome)     Past Surgical History:  Procedure Laterality Date  . ABDOMINAL HYSTERECTOMY  Age 24 - 79   S/P TAH  . abdominal ultrasound  04/23/2004   Gallstones  . BREAST LUMPECTOMY WITH RADIOACTIVE SEED LOCALIZATION Right 10/2016   breast lumpectomy with radioactive seed localization and margin assessment for complex sclerosing lesion [facility])  . BREAST LUMPECTOMY WITH RADIOACTIVE SEED LOCALIZATION Right 11/05/2016   Procedure: RIGHT BREAST LUMPECTOMY WITH RADIOACTIVE SEED LOCALIZATION;  Surgeon: Fanny Skates, MD;  Location: [place];  Service: General;  Laterality: Right;  . CARDIOVERSION N/A 01/31/2017   Procedure: CARDIOVERSION;  Surgeon: Wellington Hampshire, MD;  Location: [facility];  Service: Cardiovascular;  Laterality: N/A;  . CARDIOVERSION N/A 03/03/2017   Procedure: CARDIOVERSION;  Surgeon: Wellington Hampshire, MD;  Location: [facility];  Service: Cardiovascular;  Laterality: N/A;  . CARDIOVERSION N/A 04/02/2017   Procedure:  CARDIOVERSION;  Surgeon: Wellington Hampshire, MD;  Location: [facility];  Service: Cardiovascular;  Laterality: N/A;  . CARDIOVERSION N/A 01/16/2018   Procedure: CARDIOVERSION;  Surgeon: Wellington Hampshire, MD;  Location: [facility];  Service: Cardiovascular;  Laterality: N/A;  . CATARACT EXTRACTION  05/2010   left eye  . CERVICAL DISCECTOMY  1992   Fusion (Dr. Joya Salm)  . ESI Bilateral 01/2016   S1 transforaminal ESI x3  . ESOPHAGOGASTRODUODENOSCOPY  01/01/2002   with ulcer, bx. neg; + stricture gastric ulcer with hemorrhage  . ESOPHAGOGASTRODUODENOSCOPY  04/23/2004   Esophageal stricture -- dilated  . nuclear stress test  03/2012   no ischemia  . SKIN CANCER EXCISION  05/20/2011   BCC from nose  . US ECHOCARDIOGRAPHY  03/2012   in flutter, EF 60%, mild-mod aortic, mitral, tricuspid regurg, mildly dilated LA    No outpatient medications have been marked as taking for the 02/17/18 encounter (Appointment) with Rise Mu, PA-C.    Allergies:   Penicillins   Social History:  The patient  reports that she quit smoking about 41 years ago. She has a 0.25 pack-year smoking history. She has never used smokeless tobacco. She reports that she does not drink alcohol or use drugs.   Family History:  The patient's family history includes Cancer in her brother; Diabetes in her brother, brother, brother, and sister; Emphysema in her mother; Heart disease in her brother and father; Hypertension in her brother; Stroke in her brother, father, and sister.  ROS:   ROS   PHYSICAL EXAM: *** VS:  There were no vitals taken for this visit. BMI: There is no height or weight on file to calculate BMI.  Physical Exam   EKG:  Was ordered and interpreted by me today. Shows ***  Recent Labs: 03/25/2017: Magnesium 2.0 01/07/2018: ALT 22; TSH 2.670 01/30/2018: BUN 15; Creat 0.81; Hemoglobin 12.6; Platelets 241; Potassium 4.0; Sodium 144  02/11/2018: Cholesterol 135; HDL 59.20; LDL Cholesterol 53; Total CHOL/HDL  Ratio 2; Triglycerides 113.0; VLDL 22.6   CrCl cannot be calculated (Unknown ideal weight.).   Wt Readings from Last 3 Encounters:  01/30/18 130 lb 8 oz (59.2 kg)  01/16/18 129 lb (58.5 kg)  01/12/18 128 lb 8 oz (58.3 kg)     Other studies reviewed: Additional studies/records reviewed today include: summarized above  ASSESSMENT AND PLAN:  1. ***  Disposition: F/u with Dr. Fletcher Anon or an APP in ***  Current medicines are reviewed at length with the patient today.  The patient did not have any concerns regarding medicines.  Signed, Christell Faith, PA-C 02/16/2018 7:28 AM     [facility] [address] [place] [address] [place], [place] [address] [phone]

## 2018-02-16 NOTE — Telephone Encounter (Signed)
Patient daughter calling stating she has a scheduling conflict and is needing to know if patient truly needs the appointment with Christell Faith tomorrow afternoon She states patient did not have Cardioversion   Please advise

## 2018-02-16 NOTE — Telephone Encounter (Signed)
S/w patient's daughter, ok per DPR.  Daughter is not able to bring the patient tomorrow and needs to reschedule.  She is wondering if patient still needs a follow up because patient's cardioversion was cancelled do to patient being in sinus rhythm.  Went ahead and rescheduled patient appt for 02/26/18. In the meantime, routing to Dr Fletcher Anon for direction regarding appropriate follow up for patient and if this appointment is needed. Patient's daughter was very Patent attorney.

## 2018-02-16 NOTE — Telephone Encounter (Signed)
11/21 is fine to ensure she is not going back into atrial fibrillation.

## 2018-02-17 ENCOUNTER — Ambulatory Visit: Payer: Medicare Other | Admitting: Physician Assistant

## 2018-02-17 ENCOUNTER — Ambulatory Visit: Payer: Medicare Other | Admitting: Physical Therapy

## 2018-02-19 ENCOUNTER — Encounter: Payer: Self-pay | Admitting: Physical Therapy

## 2018-02-19 ENCOUNTER — Ambulatory Visit: Payer: Medicare Other | Admitting: Physical Therapy

## 2018-02-19 DIAGNOSIS — R269 Unspecified abnormalities of gait and mobility: Secondary | ICD-10-CM

## 2018-02-19 DIAGNOSIS — R2681 Unsteadiness on feet: Secondary | ICD-10-CM

## 2018-02-19 DIAGNOSIS — M6281 Muscle weakness (generalized): Secondary | ICD-10-CM | POA: Diagnosis not present

## 2018-02-19 NOTE — Therapy (Signed)
[place] [facility]IN [facility] SERVICES [address] [place], [place], [address] Phone: [phone]   Fax:  [phone]  Physical Therapy Treatment  Patient Details  Name: [name] MRN: [id number] Date of Birth: 1929-10-08 Referring Provider (PT): Ria Bush   Encounter Date: 02/19/2018  PT End of Session - 02/19/18 1505    Visit Number  15    Number of Visits  17    Date for PT Re-Evaluation  03/28/16    Authorization Type  5/10    PT Start Time  0300    PT Stop Time  0345    PT Time Calculation (min)  45 min    Equipment Utilized During Treatment  Gait belt    Activity Tolerance  Patient tolerated treatment well;Patient limited by fatigue    Behavior During Therapy  WFL for tasks assessed/performed       Past Medical History:  Diagnosis Date  . Abdominal aortic atherosclerosis ([place]) 09/2015   by xray  . Anxiety   . BCC (basal cell carcinoma of skin) 05/2011   on nose  . Breast lesion    a. diagnosed with complex sclerosing lesion with calcifications of the right breast in 08/2016  . Chest pain    a. nuclear stress test 03/2012: normal  . DDD (degenerative disc disease), lumbosacral 12/06/1992  . Gallstones 09/2015   incidentally by xray  . GERD (gastroesophageal reflux disease) 05/1999  . HERPES ZOSTER 04/13/2007   Qualifier: Diagnosis of  By: [name], [name]   . History of shingles    ophthalmic, takes acyclovir daily  . Hyperlipidemia 12/2003  . Hypertension 1990  . Mitral regurgitation    a. echo 03/2012: EF 60-65%, no RWMA, mild to mod AI/MR, mod TR, PASP 37 mmHg  . Osteoarthritis 08/21/1989  . Osteoporosis with fracture 11/1997   with compression fracture T12  . Persistent atrial fibrillation 03/16/2012   a. CHADS2VASc => 5 (HTN, age x 2, vascular disease, sex category)-->Eliquis 5 BID;  b. Recurrent AF in 06/2016;  c. 01/2017 s/p DCCV;  d. 02/2017 recurrent AFib->Amio added->DCCV; e. 03/2017 recurrent  AFib, s/p reload of amio and DCCV 04/02/2017  . Rheumatoid arthritis ([place])   . RLS (restless legs syndrome)     Past Surgical History:  Procedure Laterality Date  . ABDOMINAL HYSTERECTOMY  Age 83 - 51   S/P TAH  . abdominal ultrasound  04/23/2004   Gallstones  . BREAST LUMPECTOMY WITH RADIOACTIVE SEED LOCALIZATION Right 10/2016   breast lumpectomy with radioactive seed localization and margin assessment for complex sclerosing lesion [facility])  . BREAST LUMPECTOMY WITH RADIOACTIVE SEED LOCALIZATION Right 11/05/2016   Procedure: RIGHT BREAST LUMPECTOMY WITH RADIOACTIVE SEED LOCALIZATION;  Surgeon: Fanny Skates, MD;  Location: [facility];  Service: General;  Laterality: Right;  . CARDIOVERSION N/A 01/31/2017   Procedure: CARDIOVERSION;  Surgeon: Wellington Hampshire, MD;  Location: [facility];  Service: Cardiovascular;  Laterality: N/A;  . CARDIOVERSION N/A 03/03/2017   Procedure: CARDIOVERSION;  Surgeon: Wellington Hampshire, MD;  Location: [facility];  Service: Cardiovascular;  Laterality: N/A;  . CARDIOVERSION N/A 04/02/2017   Procedure: CARDIOVERSION;  Surgeon: Wellington Hampshire, MD;  Location: [facility];  Service: Cardiovascular;  Laterality: N/A;  . CARDIOVERSION N/A 01/16/2018   Procedure: CARDIOVERSION;  Surgeon: Wellington Hampshire, MD;  Location: [facility];  Service: Cardiovascular;  Laterality: N/A;  . CATARACT EXTRACTION  05/2010   left eye  . CERVICAL DISCECTOMY  1992  Fusion (Dr. Joya Salm)  . ESI Bilateral 01/2016   S1 transforaminal ESI x3  . ESOPHAGOGASTRODUODENOSCOPY  01/01/2002   with ulcer, bx. neg; + stricture gastric ulcer with hemorrhage  . ESOPHAGOGASTRODUODENOSCOPY  04/23/2004   Esophageal stricture -- dilated  . nuclear stress test  03/2012   no ischemia  . SKIN CANCER EXCISION  05/20/2011   BCC from nose  . US ECHOCARDIOGRAPHY  03/2012   in flutter, EF 60%, mild-mod aortic, mitral, tricuspid regurg, mildly dilated LA    There were no vitals filed for  this visit.  Subjective Assessment - 02/19/18 1504    Subjective  Patient is doing better and is not having any pain.     Patient is accompained by:  Family member    Pertinent History  Patient has been having balance defictis and has had several falls in the past year.     Patient Stated Goals  "I just want it to quit hurting."    Currently in Pain?  No/denies    Pain Onset  More than a month ago    Multiple Pain Sites  No      Treatment; Side stepping left and right with head turns left and right x 100 feet  Fast waking and stopping on cue x 10 reps  Fast walking with head turns left and right 100 feet x 3 Stepping over 1/2 foam, hurdles, box x 10  Instructed in safety with using rollator Tandem stand with minimal use of UE  High marching with minimal use of UE Matrix fwd/bwd stepping, side to side stepping with 22.5 lbs x 5 reps                            PT Education - 02/19/18 1505    Education provided  Yes    Education Details  Explanation; Demonstration; Tactile cues; Verbal cues    Person(s) Educated  Patient    Methods  Explanation    Comprehension  Verbalized understanding;Returned demonstration;Need further instruction       PT Short Term Goals - 01/21/18 1441      PT SHORT TERM GOAL #1   Title  Patient will be independent in home exercise program to improve strength/mobility for better functional independence with ADLs.    Time  4    Period  Weeks    Status  Achieved    Target Date  01/06/18      PT SHORT TERM GOAL #2   Title  Patient (< 72 years old) will complete five times sit to stand test in < 10 seconds indicating an increased LE strength and improved balance.    Baseline  01/21/18=UE support 41.3 s    Time  4    Period  Weeks    Status  On-going    Target Date  01/06/18        PT Long Term Goals - 02/19/18 1614      PT LONG TERM GOAL #1   Title  Patient will increase Berg Balance score by > 6 points to demonstrate  decreased fall risk during functional activities.    Baseline  01/21/18= 46/56, 02/19/18=48/56    Time  8    Period  Weeks    Status  Achieved    Target Date  03/28/16      PT LONG TERM GOAL #2   Title  Patient will increase six minute walk test distance to >1000 for progression  to community ambulator and improve gait ability    Baseline  01/21/18= 567ft w/ rollator,02/19/18 625 feet    Time  8    Period  Weeks    Status  On-going    Target Date  03/28/18      PT LONG TERM GOAL #3   Title  Patient will increase 10 meter walk test to >1.6m/s as to improve gait speed for better community ambulation and to reduce fall risk.    Baseline  01/21/18= 0.82 m/s (no AD), 02/19/18 . 95 m/sec    Time  8    Period  Weeks    Status  On-going    Target Date  03/28/18            Plan - 02/19/18 1507    Clinical Impression Statement  Patient's condition has the potential to improve in response to therapy. Maximum improvement is yet to be obtained. The anticipated improvement is attainable and reasonable in a generally predictable time.  Patient reports that her back is feeling better and she is able to walk faster and she made brownies in the kitchen.  Dynamic and static balance interventions continued today, with a focus on unilateral LE stability.  Pt tolerated all exercises well.  Intermediate dynamic standing balance tasks were progressed with cga needed for reaching outside of her base of support.  Pt would continue to benefit from skilled therapy services in order to address balance deficits in order to decrease fall risk and improve mobility    Rehab Potential  Good    PT Frequency  2x / week    PT Duration  8 weeks    PT Treatment/Interventions  Balance training;Neuromuscular re-education;Therapeutic exercise;Patient/family education;Manual techniques;Therapeutic activities;Moist Heat;Aquatic Therapy;Functional mobility training    PT Next Visit Plan  HEP progression, balance training,  strengthening    PT Home Exercise Plan  heel raises, sit to stand    Consulted and Agree with Plan of Care  Patient;Family member/caregiver       Patient will benefit from skilled therapeutic intervention in order to improve the following deficits and impairments:  Decreased balance, Decreased endurance, Abnormal gait, Decreased mobility, Difficulty walking, Dizziness, Pain, Impaired flexibility, Decreased strength, Decreased activity tolerance  Visit Diagnosis: Unsteadiness on feet  Abnormality of gait and mobility  Muscle weakness (generalized)     Problem List Patient Active Problem List   Diagnosis Date Noted  . Peripheral neuropathy 12/04/2017  . General unsteadiness 10/01/2017  . Constipation 10/01/2017  . Esophageal dysphagia 04/08/2017  . Chronic radicular pain of lower back 04/08/2017  . Fall 03/04/2017  . Encounter for chronic pain management 11/11/2016  . Breast mass, right 09/29/2016  . Abdominal aortic atherosclerosis ([place]) 09/07/2015  . Insomnia 06/08/2015  . Health maintenance examination 12/08/2014  . Advanced care planning/counseling discussion 12/08/2014  . Chronic leg pain 10/13/2014  . Fatigue 10/28/2013  . Persistent atrial fibrillation 04/24/2013  . Medicare annual wellness visit, subsequent 04/14/2012  . Atypical chest pain 03/16/2012  . RLS (restless legs syndrome) 02/07/2012  . DDD (degenerative disc disease), lumbar 12/18/2009  . MDD (major depressive disorder), recurrent episode ([place]) 12/10/2007  . POSTHERPETIC NEURALGIA 04/20/2007  . HLD (hyperlipidemia) 11/24/2006  . PLMD (periodic limb movement disorder) 11/24/2006  . Essential hypertension 11/24/2006  . GERD 11/24/2006  . GASTRIC ULCER 11/24/2006  . Osteoarthritis 11/24/2006  . Osteoporosis with pathological fracture 11/24/2006    Alanson Puls, PT DPT 02/19/2018, 4:19 PM  [place] [facility]  [place],  [place], [address] Phone: [phone]   Fax:  [phone]  Name: [name] MRN: [id number] Date of Birth: [birth year]

## 2018-02-20 DIAGNOSIS — M5136 Other intervertebral disc degeneration, lumbar region: Secondary | ICD-10-CM | POA: Diagnosis not present

## 2018-02-20 DIAGNOSIS — M48062 Spinal stenosis, lumbar region with neurogenic claudication: Secondary | ICD-10-CM | POA: Diagnosis not present

## 2018-02-20 DIAGNOSIS — M62830 Muscle spasm of back: Secondary | ICD-10-CM | POA: Diagnosis not present

## 2018-02-20 DIAGNOSIS — M5416 Radiculopathy, lumbar region: Secondary | ICD-10-CM | POA: Diagnosis not present

## 2018-02-20 DIAGNOSIS — M7062 Trochanteric bursitis, left hip: Secondary | ICD-10-CM | POA: Diagnosis not present

## 2018-02-22 NOTE — Progress Notes (Signed)
Cardiology Office Note Date:  02/26/2018  Patient ID:  Crystal, Payne [birth year], MRN [id number] PCP:  Crystal Bush, MD  Cardiologist:  Dr. Fletcher Anon, MD    Chief Complaint: Follow up  History of Present Illness: [name] is a 84 y.o. female with history of persistent Afib on Eliquis s/p multiple DCCV as below with recently diagnosed atrial flutter with a baseline sinus rate that is bradycardic, HTN, HLD, RLS, RA, DDD, anxiety, and GERD who presents for follow up of recent DCCV.   Prior nuclear stress test in 2013 showed no evidence of ischemia. In 06/2016, she was noted to be in atrial fibrillation. She was anticoagulated with Eliquis and initial attempts were made at rate control however, she continued to have dyspnea on exertion. Echo in 11/2016 showed normal LV systolic function, mild MR, and a mildly dilated left atrium measuring 37 mm. On 01/31/2017, she underwent DCCV which was successful, but patient believed it only lasted 1-2 days. When she was seen in 02/2017, she was back in atrial fibrillation she was placed on amiodarone. She underwent repeat DCCV 03/03/2017, which was again only briefly successful, ~ 3 days after DCCV, she noted dyspnea on exertion and also irregular heart rhythm. She was seen in the office on 03/25/2017 and noted to be back in Afib with RVR with heart rates in the 110s bpm. She was re-loaded with amiodarone, with plans to undergo repeat DCCV, for a third time, on 04/02/2017. Upon arrival for DCCV on 04/02/2017, she was noted to have a regular rhythm prior to her DCCV. There was concern for possible atypical atrial flutter, thus she was given 6 mg of IV adenosine which slowed the heart rate enough to see flutter waves. She then underwent successful DCCV. She was seen in late 12/2017 for a ulceration of the left ankle and was evaluated with a lower extremity arterial Doppler that showed normal ABI. However, she was noted to be tachycardic and an EKG was  performed which showed atypical atrial flutter. She noted palpitations and fatigue. She was evaluated by Dr. Saunders Revel and metoprolol was resumed at 25 mg bid (had previously been held while in sinus rhythm given baseline bradycardic sinus rates). She was evaluated by Dr. Fletcher Anon on 01/12/2018, and remained in rate-controlled atrial flutter. Recent labs showed normal thyroid function, CMET, and CBC. She was noted to have been compliant with her Eliquis. She presented for DCCV on 01/16/2018 (holding metoprolol on the morning of the procedure secondary to history of bradycardia when in sinus). However, upon arrival for the procedure, she was noted to have been in sinus rhythm. DCCV was cancelled and she was continued on current medications.    She comes in accompanied by her daughter today and is doing well. She has not had any symptoms concerning for recurrence of Afib. No chest pain or SOB. She does note exertional fatigue and bilateral ankle swelling and she cannot recall if these are any worse, the same, or better when compared to her more recent visits. She has remained compliant with Eliquis, amiodarone, and Lopressor. No falls since she was last seen. No BRBPR or melena. She denies any dizziness, presyncope, syncope, PND, orthopnea, or early satiety. Her weight is up 4 pounds when compared to her 01/12/2018 office visit, though she is wearing heavier, winter clothes. She notes the lower extremity swelling will improve if she elevates her legs. She has compression stockings at home, though is not using them.   Past Medical History:  Diagnosis Date  . Abdominal aortic atherosclerosis ([place]) 09/2015   by xray  . Anxiety   . BCC (basal cell carcinoma of skin) 05/2011   on nose  . Breast lesion    a. diagnosed with complex sclerosing lesion with calcifications of the right breast in 08/2016  . Chest pain    a. nuclear stress test 03/2012: normal  . DDD (degenerative disc disease), lumbosacral 12/06/1992  .  Gallstones 09/2015   incidentally by xray  . GERD (gastroesophageal reflux disease) 05/1999  . HERPES ZOSTER 04/13/2007   Qualifier: Diagnosis of  By: [name], [name]   . History of shingles    ophthalmic, takes acyclovir daily  . Hyperlipidemia 12/2003  . Hypertension 1990  . Mitral regurgitation    a. echo 03/2012: EF 60-65%, no RWMA, mild to mod AI/MR, mod TR, PASP 37 mmHg  . Osteoarthritis 08/21/1989  . Osteoporosis with fracture 11/1997   with compression fracture T12  . Persistent atrial fibrillation 03/16/2012   a. CHADS2VASc => 5 (HTN, age x 2, vascular disease, sex category)-->Eliquis 5 BID;  b. Recurrent AF in 06/2016;  c. 01/2017 s/p DCCV;  d. 02/2017 recurrent AFib->Amio added->DCCV; e. 03/2017 recurrent AFib, s/p reload of amio and DCCV 04/02/2017  . Rheumatoid arthritis ([place])   . RLS (restless legs syndrome)     Past Surgical History:  Procedure Laterality Date  . ABDOMINAL HYSTERECTOMY  Age 32 - 79   S/P TAH  . abdominal ultrasound  04/23/2004   Gallstones  . BREAST LUMPECTOMY WITH RADIOACTIVE SEED LOCALIZATION Right 10/2016   breast lumpectomy with radioactive seed localization and margin assessment for complex sclerosing lesion [facility])  . BREAST LUMPECTOMY WITH RADIOACTIVE SEED LOCALIZATION Right 11/05/2016   Procedure: RIGHT BREAST LUMPECTOMY WITH RADIOACTIVE SEED LOCALIZATION;  Surgeon: Fanny Skates, MD;  Location: [facility];  Service: General;  Laterality: Right;  . CARDIOVERSION N/A 01/31/2017   Procedure: CARDIOVERSION;  Surgeon: Wellington Hampshire, MD;  Location: [facility];  Service: Cardiovascular;  Laterality: N/A;  . CARDIOVERSION N/A 03/03/2017   Procedure: CARDIOVERSION;  Surgeon: Wellington Hampshire, MD;  Location: [facility];  Service: Cardiovascular;  Laterality: N/A;  . CARDIOVERSION N/A 04/02/2017   Procedure: CARDIOVERSION;  Surgeon: Wellington Hampshire, MD;  Location: [facility];  Service: Cardiovascular;  Laterality: N/A;    . CARDIOVERSION N/A 01/16/2018   Procedure: CARDIOVERSION;  Surgeon: Wellington Hampshire, MD;  Location: [facility];  Service: Cardiovascular;  Laterality: N/A;  . CATARACT EXTRACTION  05/2010   left eye  . CERVICAL DISCECTOMY  1992   Fusion (Dr. Joya Salm)  . ESI Bilateral 01/2016   S1 transforaminal ESI x3  . ESOPHAGOGASTRODUODENOSCOPY  01/01/2002   with ulcer, bx. neg; + stricture gastric ulcer with hemorrhage  . ESOPHAGOGASTRODUODENOSCOPY  04/23/2004   Esophageal stricture -- dilated  . nuclear stress test  03/2012   no ischemia  . SKIN CANCER EXCISION  05/20/2011   BCC from nose  . US ECHOCARDIOGRAPHY  03/2012   in flutter, EF 60%, mild-mod aortic, mitral, tricuspid regurg, mildly dilated LA    Current Meds  Medication Sig  . acetaminophen (TYLENOL) 500 MG tablet Take 1,000 mg every 8 (eight) hours as needed by mouth for moderate pain.   Marland Kitchen acyclovir (ZOVIRAX) 400 MG tablet Take 400 mg 2 (two) times daily by mouth.   Marland Kitchen amiodarone (PACERONE) 200 MG tablet Take 1 tablet (200 mg total) by mouth daily.  Marland Kitchen atorvastatin (LIPITOR) 20 MG tablet TAKE 1  TABLET DAILY  . denosumab (PROLIA) 60 MG/ML SOSY injection Inject 60 mg into the skin once for 1 dose.  . DULoxetine (CYMBALTA) 60 MG capsule TAKE 1 CAPSULE BY MOUTH EVERY DAY  . ELIQUIS 2.5 MG TABS tablet TAKE 1 TABLET BY MOUTH TWICE DAILY  . gabapentin (NEURONTIN) 100 MG capsule Take 2 capsules (200 mg total) by mouth 3 (three) times daily.  . metoprolol tartrate (LOPRESSOR) 25 MG tablet Take 1 tablet (25 mg total) by mouth 2 (two) times daily.  . pantoprazole (PROTONIX) 40 MG tablet TAKE 1 TABLET BY MOUTH DAILY (Patient taking differently: Take 40 mg by mouth daily. )  . prednisoLONE acetate (PRED FORTE) 1 % ophthalmic suspension Place 1 drop into the left eye daily.   Marland Kitchen rOPINIRole (REQUIP) 1 MG tablet Take 2 tablets (2 mg total) by mouth at bedtime.  Marland Kitchen tiZANidine (ZANAFLEX) 2 MG tablet Take 2 mg by mouth every evening.   . traMADol (ULTRAM)  50 MG tablet Take 1 tablet (50 mg total) by mouth 2 (two) times daily.  . Vitamin D, Ergocalciferol, (DRISDOL) 50000 units CAPS capsule TAKE 1 CAPSULE BY MOUTH ONCE WEEKLY (Patient taking differently: Take 50,000 Units by mouth every Saturday. )    Allergies:   Penicillins   Social History:  The patient  reports that she quit smoking about 41 years ago. She has a 0.25 pack-year smoking history. She has never used smokeless tobacco. She reports that she does not drink alcohol or use drugs.   Family History:  The patient's family history includes Cancer in her brother; Diabetes in her brother, brother, brother, and sister; Emphysema in her mother; Heart disease in her brother and father; Hypertension in her brother; Stroke in her brother, father, and sister.  ROS:   Review of Systems  Constitutional: Positive for malaise/fatigue. Negative for chills, diaphoresis, fever and weight loss.  HENT: Negative for congestion.   Eyes: Negative for discharge and redness.  Respiratory: Negative for cough, hemoptysis, sputum production, shortness of breath and wheezing.   Cardiovascular: Negative for chest pain, palpitations, orthopnea, claudication, leg swelling and PND.  Gastrointestinal: Negative for abdominal pain, blood in stool, heartburn, melena, nausea and vomiting.  Genitourinary: Negative for hematuria.  Musculoskeletal: Negative for falls and myalgias.  Skin: Negative for rash.  Neurological: Positive for weakness. Negative for dizziness, tingling, tremors, sensory change, speech change, focal weakness and loss of consciousness.  Endo/Heme/Allergies: Does not bruise/bleed easily.  Psychiatric/Behavioral: Negative for substance abuse. The patient is not nervous/anxious.   All other systems reviewed and are negative.    PHYSICAL EXAM:  VS:  BP 140/78 (BP Location: Left Arm, Patient Position: Sitting, Cuff Size: Normal)   Pulse (!) 52   Ht 5\' 2"  (1.575 m)   Wt 132 lb 12 oz (60.2 kg)   BMI  24.28 kg/m  BMI: Body mass index is 24.28 kg/m.  Physical Exam  Constitutional: She is oriented to person, place, and time. She appears well-developed and well-nourished.  HENT:  Head: Normocephalic and atraumatic.  Eyes: Right eye exhibits no discharge. Left eye exhibits no discharge.  Neck: Normal range of motion. No JVD present.  Cardiovascular: Regular rhythm, S1 normal, S2 normal and normal heart sounds. Bradycardia present. Exam reveals no distant heart sounds, no friction rub, no midsystolic click and no opening snap.  No murmur heard. Pulses:      Posterior tibial pulses are 2+ on the right side, and 2+ on the left side.  Pulmonary/Chest: Effort normal and breath  sounds normal. No respiratory distress. She has no decreased breath sounds. She has no wheezes. She has no rales. She exhibits no tenderness.  Abdominal: Soft. She exhibits no distension. There is no tenderness.  Musculoskeletal: She exhibits edema.  Trace bilateral pretibial edema.   Neurological: She is alert and oriented to person, place, and time.  Skin: Skin is warm and dry. No cyanosis. Nails show no clubbing.  Psychiatric: She has a normal mood and affect. Her speech is normal and behavior is normal. Judgment and thought content normal.     EKG:  Was ordered and interpreted by me today. Shows sinus bradycardia, 53 bpm, no acute st/t changes   Recent Labs: 03/25/2017: Magnesium 2.0 01/07/2018: ALT 22; TSH 2.670 01/30/2018: BUN 15; Creat 0.81; Hemoglobin 12.6; Platelets 241; Potassium 4.0; Sodium 144  02/11/2018: Cholesterol 135; HDL 59.20; LDL Cholesterol 53; Total CHOL/HDL Ratio 2; Triglycerides 113.0; VLDL 22.6   CrCl cannot be calculated (Patient's most recent lab result is older than the maximum 21 days allowed.).   Wt Readings from Last 3 Encounters:  02/26/18 132 lb 12 oz (60.2 kg)  02/25/18 133 lb 4 oz (60.4 kg)  01/30/18 130 lb 8 oz (59.2 kg)     Other studies reviewed: Additional studies/records  reviewed today include: summarized above  ASSESSMENT AND PLAN:  1. Atypical atrial flutter/persistent Afib: She is doing well and maintaining sinus rhythm with a bradycardic rate. Continue amiodarone 200 mg daily and Lopressor 25 mg bid. See below for details regarding the continuation of these medications. Continue Eliquis 2.5 mg bid (her age and dry weight indicate she should be on reduced dose Eliquis, so we will keep her dose the same at this time).   2. HTN: Blood pressure is reasonably controlled today. Continue metoprolol.   3. HLD: Most recent LDL of 53 from 02/2018. Remains on Lipitor 20 mg daily.   4. Bradycardia: At baseline, she has a mildly bradycardic heart rate when in sinus rhythm. Lengthy discussion with the patient and her daughter today regarding her bradycardia and fatigue in the setting of her underlying atrial arrhythmias. We have ultimately decided to continue her on amiodarone 200 mg daily as well as Lopressor 25 mg bid as she is relatively asymptomatic from a fatigue standpoint and she really wants to keep everything like it is currently given she is maintaining sinus rhythm.   5. Fatigue: After further discussions with the patient and her daughter this actually appears to only be mild. Likely multifactorial including deconditioning with some component of beta blocker as well. Recommend she remain active.   6. Lower extremity swelling: Likely dependent edema in the setting of venous insufficiency. Obtain and echo given her fatigue, mild weight gain since last visit, and lower extremity swelling, especially in the setting of her recent atrial arrhthymias. Recommend elevation of legs and compression stockings.   Disposition: F/u with Dr. Fletcher Anon or an APP in 3 months.   Current medicines are reviewed at length with the patient today.  The patient did not have any concerns regarding medicines.  Signed, Christell Faith, PA-C 02/26/2018 2:21 PM     [place] [address], [place] [address] [phone]

## 2018-02-25 ENCOUNTER — Ambulatory Visit: Payer: Medicare Other | Admitting: Family Medicine

## 2018-02-25 ENCOUNTER — Encounter: Payer: Self-pay | Admitting: Family Medicine

## 2018-02-25 ENCOUNTER — Other Ambulatory Visit: Payer: Medicare Other

## 2018-02-25 VITALS — BP 126/68 | HR 58 | Temp 98.2°F | Ht 62.0 in | Wt 133.2 lb

## 2018-02-25 DIAGNOSIS — M150 Primary generalized (osteo)arthritis: Secondary | ICD-10-CM | POA: Diagnosis not present

## 2018-02-25 DIAGNOSIS — M8000XD Age-related osteoporosis with current pathological fracture, unspecified site, subsequent encounter for fracture with routine healing: Secondary | ICD-10-CM

## 2018-02-25 DIAGNOSIS — R2681 Unsteadiness on feet: Secondary | ICD-10-CM

## 2018-02-25 DIAGNOSIS — M5136 Other intervertebral disc degeneration, lumbar region: Secondary | ICD-10-CM

## 2018-02-25 DIAGNOSIS — G8929 Other chronic pain: Secondary | ICD-10-CM

## 2018-02-25 DIAGNOSIS — F331 Major depressive disorder, recurrent, moderate: Secondary | ICD-10-CM

## 2018-02-25 DIAGNOSIS — G6289 Other specified polyneuropathies: Secondary | ICD-10-CM | POA: Diagnosis not present

## 2018-02-25 DIAGNOSIS — M159 Polyosteoarthritis, unspecified: Secondary | ICD-10-CM

## 2018-02-25 DIAGNOSIS — E559 Vitamin D deficiency, unspecified: Secondary | ICD-10-CM

## 2018-02-25 DIAGNOSIS — E785 Hyperlipidemia, unspecified: Secondary | ICD-10-CM

## 2018-02-25 DIAGNOSIS — M5416 Radiculopathy, lumbar region: Secondary | ICD-10-CM

## 2018-02-25 MED ORDER — DENOSUMAB 60 MG/ML ~~LOC~~ SOSY
60.0000 mg | PREFILLED_SYRINGE | Freq: Once | SUBCUTANEOUS | Status: AC
  Administered 2018-02-25: 60 mg via SUBCUTANEOUS

## 2018-02-25 MED ORDER — GABAPENTIN 100 MG PO CAPS: 200.0000 mg | ORAL_CAPSULE | Freq: Three times a day (TID) | ORAL | 3 refills | Status: DC

## 2018-02-25 NOTE — Telephone Encounter (Signed)
Given Prolia inj today at [place].

## 2018-02-25 NOTE — Assessment & Plan Note (Signed)
Will continue titration of gabapentin as tolerated.

## 2018-02-25 NOTE — Assessment & Plan Note (Signed)
2nd prolia shot today.  Vit D levels now repleted - continue 50k u weekly.

## 2018-02-25 NOTE — Assessment & Plan Note (Addendum)
Continues balance training PT - has been advised to use walker regularly.  We discussed and I recommended avoiding driving at this time. She would be due to renew license early next year.

## 2018-02-25 NOTE — Assessment & Plan Note (Signed)
Levels now repleted with weekly supplement dose.

## 2018-02-25 NOTE — Assessment & Plan Note (Signed)
Chronic, stable on current regimen - continue atorvastatin 20mg  daily.

## 2018-02-25 NOTE — Assessment & Plan Note (Addendum)
Continue cymbalta. Continues struggling with physical limitations imposed by chronic back and leg pains.

## 2018-02-25 NOTE — Patient Instructions (Addendum)
prolia shot today Schedule wellness visit/physical in 1-2 months.  I think hip pain is coming from back. Hips are moving ok  Increase gabapentin to 200mg  three times a day.  Continue using walker regularly.  I agree holding on driving at this time.

## 2018-02-25 NOTE — Assessment & Plan Note (Signed)
Appreciate PMR care ([place]).

## 2018-02-25 NOTE — Progress Notes (Signed)
BP 126/68 (BP Location: Left Arm, Patient Position: Sitting, Cuff Size: Normal)   Pulse (!) 58   Temp 98.2 F (36.8 C) (Oral)   Ht 5\' 2"  (1.575 m)   Wt 133 lb 4 oz (60.4 kg)   SpO2 94%   BMI 24.37 kg/m    CC: 103mo f/u visit Subjective:    Patient ID: [name], female    DOB: [birth year], 84 y.o.   MRN: [id number]  HPI: [name] is a 84 y.o. female presenting on 02/25/2018 for Follow-up (Pt accompanied by her daughter-in-law, [name]. )   See prior note for details. Worsening leg pain presumed due to worsening neuropathy in h/o lumbar spondylosis followed by PMR Dr Sharlet Salina with Q3 mo steroid injections. Last visit we increased gabapentin to 100/100/200. Receives tramadol. Progressive skin changes present. Recent labwork unrevealing.   Seeing PT for balance training.   Some concern by family about ongoing safety in driving - She is currently not driving. Regularly using walker.   Osteoporosis - receiving prolia. First dose 05/2017. Will get next shot today.   Relevant past medical, surgical, family and social history reviewed and updated as indicated. Interim medical history since our last visit reviewed. Allergies and medications reviewed and updated. Outpatient Medications Prior to Visit  Medication Sig Dispense Refill  . acetaminophen (TYLENOL) 500 MG tablet Take 1,000 mg every 8 (eight) hours as needed by mouth for moderate pain.     Marland Kitchen acyclovir (ZOVIRAX) 400 MG tablet Take 400 mg 2 (two) times daily by mouth.     Marland Kitchen amiodarone (PACERONE) 200 MG tablet Take 1 tablet (200 mg total) by mouth daily. 90 tablet 3  . atorvastatin (LIPITOR) 20 MG tablet TAKE 1 TABLET DAILY 90 tablet 0  . denosumab (PROLIA) 60 MG/ML SOSY injection Inject 60 mg into the skin once for 1 dose. 1 mL 0  . DULoxetine (CYMBALTA) 60 MG capsule TAKE 1 CAPSULE BY MOUTH EVERY DAY 30 capsule 0  . ELIQUIS 2.5 MG TABS tablet TAKE 1 TABLET BY MOUTH TWICE DAILY 180 tablet 1  . metoprolol tartrate  (LOPRESSOR) 25 MG tablet Take 1 tablet (25 mg total) by mouth 2 (two) times daily. 180 tablet 3  . pantoprazole (PROTONIX) 40 MG tablet TAKE 1 TABLET BY MOUTH DAILY (Patient taking differently: Take 40 mg by mouth daily. ) 30 tablet 2  . prednisoLONE acetate (PRED FORTE) 1 % ophthalmic suspension Place 1 drop into the left eye daily.     Marland Kitchen rOPINIRole (REQUIP) 1 MG tablet Take 2 tablets (2 mg total) by mouth at bedtime. 180 tablet 0  . tiZANidine (ZANAFLEX) 2 MG tablet Take 2 mg by mouth every evening.     . traMADol (ULTRAM) 50 MG tablet Take 1 tablet (50 mg total) by mouth 2 (two) times daily.  0  . Vitamin D, Ergocalciferol, (DRISDOL) 50000 units CAPS capsule TAKE 1 CAPSULE BY MOUTH ONCE WEEKLY (Patient taking differently: Take 50,000 Units by mouth every Saturday. ) 12 capsule 1  . gabapentin (NEURONTIN) 100 MG capsule TAKE 1 CAP IN AM 1 IN AFTERNOON AND 2 AT NIGHT 120 capsule 3   No facility-administered medications prior to visit.      Per HPI unless specifically indicated in ROS section below Review of Systems     Objective:    BP 126/68 (BP Location: Left Arm, Patient Position: Sitting, Cuff Size: Normal)   Pulse (!) 58   Temp 98.2 F (36.8 C) (Oral)   Ht  5\' 2"  (1.575 m)   Wt 133 lb 4 oz (60.4 kg)   SpO2 94%   BMI 24.37 kg/m   Wt Readings from Last 3 Encounters:  02/25/18 133 lb 4 oz (60.4 kg)  01/30/18 130 lb 8 oz (59.2 kg)  01/16/18 129 lb (58.5 kg)    Physical Exam  Constitutional: She appears well-developed and well-nourished. No distress.  Musculoskeletal: Normal range of motion. She exhibits no edema.  2+ DP on right, 1+ DP on left No pain with int/ext rotation at bilateral hips No significant midline lumbar spine pain Bilateral buttock pain L>R   Skin: Skin is warm and dry. No rash noted.  Slight ruddy red distal lower extremities, cool  Nursing note and vitals reviewed.  Results for orders placed or performed in visit on 02/11/18  VITAMIN D 25 Hydroxy (Vit-D  Deficiency, Fractures)  Result Value Ref Range   VITD 52.06 30.00 - 100.00 ng/mL  Lipid panel  Result Value Ref Range   Cholesterol 135 0 - 200 mg/dL   Triglycerides 113.0 0.0 - 149.0 mg/dL   HDL 59.20 >39.00 mg/dL   VLDL 22.6 0.0 - 40.0 mg/dL   LDL Cholesterol 53 0 - 99 mg/dL   Total CHOL/HDL Ratio 2    NonHDL 75.95   Calcium  Result Value Ref Range   Calcium 9.3 8.4 - 10.5 mg/dL   Lab Results  Component Value Date   WBC 7.1 01/30/2018   HGB 12.6 01/30/2018   HCT 36.0 01/30/2018   MCV 94.2 01/30/2018   PLT 241 01/30/2018    Lab Results  Component Value Date   CREATININE 0.81 01/30/2018   BUN 15 01/30/2018   NA 144 01/30/2018   K 4.0 01/30/2018   CL 105 01/30/2018   CO2 30 01/30/2018   No results found for: HGBA1C     Assessment & Plan:   Problem List Items Addressed This Visit    Vitamin D deficiency    Levels now repleted with weekly supplement dose.       Peripheral neuropathy    Will continue gabapentin taper as tolerated to 200mg  TID      Relevant Medications   gabapentin (NEURONTIN) 100 MG capsule   Osteoporosis with pathological fracture - Primary    2nd prolia shot today.  Vit D levels now repleted - continue 50k u weekly.       Relevant Medications   denosumab (PROLIA) 60 MG/ML SOSY injection   denosumab (PROLIA) injection 60 mg (Completed)   Osteoarthritis   MDD (major depressive disorder), recurrent episode ([place])    Continue cymbalta. Continues struggling with physical limitations imposed by chronic back and leg pains.       HLD (hyperlipidemia)    Chronic, stable on current regimen - continue atorvastatin 20mg  daily.       General unsteadiness    Continues balance training PT - has been advised to use walker regularly.  We discussed and I recommended avoiding driving at this time. She would be due to renew license early next year.       DDD (degenerative disc disease), lumbar    Appreciate PMR care ([place]).       Chronic radicular  pain of lower back    Will continue titration of gabapentin as tolerated.       Relevant Medications   gabapentin (NEURONTIN) 100 MG capsule       Meds ordered this encounter  Medications  . denosumab (PROLIA) injection 60 mg  .  gabapentin (NEURONTIN) 100 MG capsule    Sig: Take 2 capsules (200 mg total) by mouth 3 (three) times daily.    Dispense:  180 capsule    Refill:  3   No orders of the defined types were placed in this encounter.   Follow up plan: Return in about 6 weeks (around 04/08/2018).  Ria Bush, MD

## 2018-02-25 NOTE — Assessment & Plan Note (Signed)
Will continue gabapentin taper as tolerated to 200mg  TID

## 2018-02-26 ENCOUNTER — Ambulatory Visit: Payer: Medicare Other | Admitting: Physician Assistant

## 2018-02-26 ENCOUNTER — Encounter: Payer: Self-pay | Admitting: Physician Assistant

## 2018-02-26 ENCOUNTER — Ambulatory Visit: Payer: Medicare Other | Admitting: Physical Therapy

## 2018-02-26 VITALS — BP 140/78 | HR 52 | Ht 62.0 in | Wt 132.8 lb

## 2018-02-26 DIAGNOSIS — I4819 Other persistent atrial fibrillation: Secondary | ICD-10-CM | POA: Diagnosis not present

## 2018-02-26 DIAGNOSIS — R001 Bradycardia, unspecified: Secondary | ICD-10-CM

## 2018-02-26 DIAGNOSIS — R5383 Other fatigue: Secondary | ICD-10-CM

## 2018-02-26 DIAGNOSIS — I484 Atypical atrial flutter: Secondary | ICD-10-CM

## 2018-02-26 DIAGNOSIS — E782 Mixed hyperlipidemia: Secondary | ICD-10-CM

## 2018-02-26 DIAGNOSIS — M25473 Effusion, unspecified ankle: Secondary | ICD-10-CM

## 2018-02-26 DIAGNOSIS — I10 Essential (primary) hypertension: Secondary | ICD-10-CM

## 2018-02-26 DIAGNOSIS — M25472 Effusion, left ankle: Secondary | ICD-10-CM

## 2018-02-26 DIAGNOSIS — M25476 Effusion, unspecified foot: Secondary | ICD-10-CM

## 2018-02-26 DIAGNOSIS — M25471 Effusion, right ankle: Secondary | ICD-10-CM

## 2018-02-26 NOTE — Patient Instructions (Addendum)
Medication Instructions:  Your physician recommends that you continue on your current medications as directed. Please refer to the Current Medication list given to you today.  If you need a refill on your cardiac medications before your next appointment, please call your pharmacy.   Lab work: NONE  If you have labs (blood work) drawn today and your tests are completely normal, you will receive your results only by: Marland Kitchen MyChart Message (if you have MyChart) OR . A paper copy in the mail If you have any lab test that is abnormal or we need to change your treatment, we will call you to review the results.  Testing/Procedures: Your physician has requested that you have an echocardiogram. Echocardiography is a painless test that uses sound waves to create images of your heart. It provides your doctor with information about the size and shape of your heart and how well your heart's chambers and valves are working. This procedure takes approximately one hour. There are no restrictions for this procedure.    Follow-Up: At [facility], you and your health needs are our priority.  As part of our continuing mission to provide you with exceptional heart care, we have created designated Provider Care Teams.  These Care Teams include your primary Cardiologist (physician) and Advanced Practice Providers (APPs -  Physician Assistants and Nurse Practitioners) who all work together to provide you with the care you need, when you need it. You will need a follow up appointment in 3 months.  Please call our office 2 months in advance to schedule this appointment.  You may see [name], MD or one of the following Advanced Practice Providers on your designated Care Team:   Murray Hodgkins, NP Christell Faith, PA-C . Marrianne Mood, PA-C  Any Other Special Instructions Will Be Listed Below (If Applicable).  You have been given samples of Eliquis today.   Thank you for choosing [facility]!

## 2018-03-02 ENCOUNTER — Ambulatory Visit: Payer: Medicare Other | Admitting: Family Medicine

## 2018-03-03 DIAGNOSIS — M5416 Radiculopathy, lumbar region: Secondary | ICD-10-CM | POA: Diagnosis not present

## 2018-03-03 DIAGNOSIS — M5136 Other intervertebral disc degeneration, lumbar region: Secondary | ICD-10-CM | POA: Diagnosis not present

## 2018-03-03 DIAGNOSIS — M62830 Muscle spasm of back: Secondary | ICD-10-CM | POA: Diagnosis not present

## 2018-03-10 ENCOUNTER — Ambulatory Visit: Payer: Medicare Other | Attending: Family Medicine | Admitting: Physical Therapy

## 2018-03-10 DIAGNOSIS — R2681 Unsteadiness on feet: Secondary | ICD-10-CM | POA: Diagnosis not present

## 2018-03-10 DIAGNOSIS — R269 Unspecified abnormalities of gait and mobility: Secondary | ICD-10-CM | POA: Diagnosis not present

## 2018-03-10 DIAGNOSIS — M6281 Muscle weakness (generalized): Secondary | ICD-10-CM | POA: Diagnosis not present

## 2018-03-10 NOTE — Therapy (Signed)
[place] MAIN [facility] SERVICES [address] [place], [place], [address] Phone: [phone]   Fax:  [phone]  Physical Therapy Treatment  Patient Details  Name: [name] MRN: [id number] Date of Birth: 1929/09/10 Referring Provider (PT): Ria Bush   Encounter Date: 03/10/2018  PT End of Session - 03/10/18 1436    Visit Number  16    Number of Visits  17    Date for PT Re-Evaluation  03/28/16    Authorization Type  5/10    PT Start Time  1355    PT Stop Time  1434    PT Time Calculation (min)  39 min    Equipment Utilized During Treatment  Gait belt    Activity Tolerance  Patient tolerated treatment well;Patient limited by fatigue    Behavior During Therapy  [facility] for tasks assessed/performed       Past Medical History:  Diagnosis Date  . Abdominal aortic atherosclerosis ([place]) 09/2015   by xray  . Anxiety   . BCC (basal cell carcinoma of skin) 05/2011   on nose  . Breast lesion    a. diagnosed with complex sclerosing lesion with calcifications of the right breast in 08/2016  . Chest pain    a. nuclear stress test 03/2012: normal  . DDD (degenerative disc disease), lumbosacral 12/06/1992  . Gallstones 09/2015   incidentally by xray  . GERD (gastroesophageal reflux disease) 05/1999  . HERPES ZOSTER 04/13/2007   Qualifier: Diagnosis of  By: [name], [name]   . History of shingles    ophthalmic, takes acyclovir daily  . Hyperlipidemia 12/2003  . Hypertension 1990  . Mitral regurgitation    a. echo 03/2012: EF 60-65%, no RWMA, mild to mod AI/MR, mod TR, PASP 37 mmHg  . Osteoarthritis 08/21/1989  . Osteoporosis with fracture 11/1997   with compression fracture T12  . Persistent atrial fibrillation 03/16/2012   a. CHADS2VASc => 5 (HTN, age x 2, vascular disease, sex category)-->Eliquis 5 BID;  b. Recurrent AF in 06/2016;  c. 01/2017 s/p DCCV;  d. 02/2017 recurrent AFib->Amio added->DCCV; e. 03/2017 recurrent  AFib, s/p reload of amio and DCCV 04/02/2017  . Rheumatoid arthritis ([place])   . RLS (restless legs syndrome)     Past Surgical History:  Procedure Laterality Date  . ABDOMINAL HYSTERECTOMY  Age 40 - 24   S/P TAH  . abdominal ultrasound  04/23/2004   Gallstones  . BREAST LUMPECTOMY WITH RADIOACTIVE SEED LOCALIZATION Right 10/2016   breast lumpectomy with radioactive seed localization and margin assessment for complex sclerosing lesion [facility])  . BREAST LUMPECTOMY WITH RADIOACTIVE SEED LOCALIZATION Right 11/05/2016   Procedure: RIGHT BREAST LUMPECTOMY WITH RADIOACTIVE SEED LOCALIZATION;  Surgeon: Fanny Skates, MD;  Location: [place];  Service: General;  Laterality: Right;  . CARDIOVERSION N/A 01/31/2017   Procedure: CARDIOVERSION;  Surgeon: Wellington Hampshire, MD;  Location: [facility];  Service: Cardiovascular;  Laterality: N/A;  . CARDIOVERSION N/A 03/03/2017   Procedure: CARDIOVERSION;  Surgeon: Wellington Hampshire, MD;  Location: [place] [facility];  Service: Cardiovascular;  Laterality: N/A;  . CARDIOVERSION N/A 04/02/2017   Procedure: CARDIOVERSION;  Surgeon: Wellington Hampshire, MD;  Location: [facility];  Service: Cardiovascular;  Laterality: N/A;  . CARDIOVERSION N/A 01/16/2018   Procedure: CARDIOVERSION;  Surgeon: Wellington Hampshire, MD;  Location: [facility];  Service: Cardiovascular;  Laterality: N/A;  . CATARACT EXTRACTION  05/2010   left eye  . CERVICAL DISCECTOMY  1992  Fusion (Dr. Joya Salm)  . ESI Bilateral 01/2016   S1 transforaminal ESI x3  . ESOPHAGOGASTRODUODENOSCOPY  01/01/2002   with ulcer, bx. neg; + stricture gastric ulcer with hemorrhage  . ESOPHAGOGASTRODUODENOSCOPY  04/23/2004   Esophageal stricture -- dilated  . nuclear stress test  03/2012   no ischemia  . SKIN CANCER EXCISION  05/20/2011   BCC from nose  . US ECHOCARDIOGRAPHY  03/2012   in flutter, EF 60%, mild-mod aortic, mitral, tricuspid regurg, mildly dilated LA    There were no vitals filed for  this visit.  Subjective Assessment - 03/10/18 1435    Subjective  Pt doing alright today, reports she has not been able to get to her HEP this week as she has been busy. She has had injections in both hips since prior session and both feel better. Non of her medications have changed since her prior session.     Patient is accompained by:  Family member   daughter    Pertinent History  Patient has been having balance defictis and has had several falls in the past year.     Patient Stated Goals  "I just want it to quit hurting."    Currently in Pain?  Yes    Pain Score  6     Pain Location  --   bilat hips and low back       Treatment; -Continuous Gait, c VC for upright posture, Rt heel strike. 665ft at average speed 0.65m/s.  -Tandem Stance in // bars: 1x90sec each side (about 10 attempts each side + c LOB p ~3 seconds) -narrow stance on firm surface 2x90 seconds (several LOB with minA to recovery) -normal stance, firm surface eye closed: 1x60sec (no LOB noted)  -normal stance, foam surface eyes open 1x60sec -normal stance, foam surface eyes closed 2x30sec -Lateral side stepping 1x22M bilat, minGuard Assist, VC for fast stepping, tactile cues for pelvic neutral in frontal plane -RetroAMB 1x22M, minGuard assist -3x 22M fast AMB (no assistive device) minGuard Assist     PT Short Term Goals - 01/21/18 1441      PT SHORT TERM GOAL #1   Title  Patient will be independent in home exercise program to improve strength/mobility for better functional independence with ADLs.    Time  4    Period  Weeks    Status  Achieved    Target Date  01/06/18      PT SHORT TERM GOAL #2   Title  Patient (< 61 years old) will complete five times sit to stand test in < 10 seconds indicating an increased LE strength and improved balance.    Baseline  01/21/18=UE support 41.3 s    Time  4    Period  Weeks    Status  On-going    Target Date  01/06/18        PT Long Term Goals - 02/19/18 1614       PT LONG TERM GOAL #1   Title  Patient will increase Berg Balance score by > 6 points to demonstrate decreased fall risk during functional activities.    Baseline  01/21/18= 46/56, 02/19/18=48/56    Time  8    Period  Weeks    Status  Achieved    Target Date  03/28/16      PT LONG TERM GOAL #2   Title  Patient will increase six minute walk test distance to >1000 for progression to community ambulator and improve gait ability  Baseline  01/21/18= 569ft w/ rollator,02/19/18 625 feet    Time  8    Period  Weeks    Status  On-going    Target Date  03/28/18      PT LONG TERM GOAL #3   Title  Patient will increase 10 meter walk test to >1.67m/s as to improve gait speed for better community ambulation and to reduce fall risk.    Baseline  01/21/18= 0.82 m/s (no AD), 02/19/18 . 95 m/sec    Time  8    Period  Weeks    Status  On-going    Target Date  03/28/18            Plan - 03/10/18 1437    Clinical Impression Statement  Patient's condition has the potential to improve in response to therapy. Maximum improvement is yet to be obtained. The anticipated improvement is attainable and reasonable in a generally predictable time.  Pt able to participate in session as planned, requiring 4 rest breaks for about 60-90 seconds each. Continued with static balance activities and dynamic gait-based balance activity. Pt continues to demonstrate disequilibrium with delayed righting responses but repeated and success self-bracing techniques with //bars. Pt demonstrates similar balance function with eyes closed indicating that vestibular system may not be the primary limiting factor. Began session with continuous AMB with verbal cues for gait correction, notably Right flat foot strike and propensity for mild forward trunk flexion.     Rehab Potential  Good    PT Frequency  2x / week    PT Duration  8 weeks    PT Treatment/Interventions  Balance training;Neuromuscular re-education;Therapeutic  exercise;Patient/family education;Manual techniques;Therapeutic activities;Moist Heat;Aquatic Therapy;Functional mobility training    PT Next Visit Plan  HEP progression, balance training, strengthening    PT Home Exercise Plan  heel raises, sit to stand    Consulted and Agree with Plan of Care  Patient;Family member/caregiver       Patient will benefit from skilled therapeutic intervention in order to improve the following deficits and impairments:  Decreased balance, Decreased endurance, Abnormal gait, Decreased mobility, Difficulty walking, Dizziness, Pain, Impaired flexibility, Decreased strength, Decreased activity tolerance  Visit Diagnosis: Unsteadiness on feet  Abnormality of gait and mobility  Muscle weakness (generalized)     Problem List Patient Active Problem List   Diagnosis Date Noted  . Vitamin D deficiency 02/25/2018  . Peripheral neuropathy 12/04/2017  . General unsteadiness 10/01/2017  . Constipation 10/01/2017  . Esophageal dysphagia 04/08/2017  . Chronic radicular pain of lower back 04/08/2017  . Fall 03/04/2017  . Encounter for chronic pain management 11/11/2016  . Breast mass, right 09/29/2016  . Abdominal aortic atherosclerosis ([place]) 09/07/2015  . Insomnia 06/08/2015  . Health maintenance examination 12/08/2014  . Advanced care planning/counseling discussion 12/08/2014  . Chronic leg pain 10/13/2014  . Fatigue 10/28/2013  . Persistent atrial fibrillation 04/24/2013  . Medicare annual wellness visit, subsequent 04/14/2012  . Atypical chest pain 03/16/2012  . RLS (restless legs syndrome) 02/07/2012  . DDD (degenerative disc disease), lumbar 12/18/2009  . MDD (major depressive disorder), recurrent episode ([place]) 12/10/2007  . POSTHERPETIC NEURALGIA 04/20/2007  . HLD (hyperlipidemia) 11/24/2006  . PLMD (periodic limb movement disorder) 11/24/2006  . Essential hypertension 11/24/2006  . GERD 11/24/2006  . GASTRIC ULCER 11/24/2006  . Osteoarthritis  11/24/2006  . Osteoporosis with pathological fracture 11/24/2006   2:42 PM, 03/10/18 Etta Grandchild, PT, DPT Physical Therapist - [facility]  Outpatient Physical  [place] [phone]     Etta Grandchild 03/10/2018, 2:42 PM  [place] MAIN [facility] SERVICES [address] [place], [place], [address] Phone: [phone]   Fax:  [phone]  Name: KIYOKO MCGUIRT MRN: [id number] Date of Birth: [birth year]

## 2018-03-12 ENCOUNTER — Other Ambulatory Visit: Payer: Self-pay | Admitting: Family Medicine

## 2018-03-12 ENCOUNTER — Ambulatory Visit: Payer: Medicare Other | Admitting: Physical Therapy

## 2018-03-12 ENCOUNTER — Encounter: Payer: Self-pay | Admitting: Physical Therapy

## 2018-03-12 DIAGNOSIS — M6281 Muscle weakness (generalized): Secondary | ICD-10-CM | POA: Diagnosis not present

## 2018-03-12 DIAGNOSIS — R269 Unspecified abnormalities of gait and mobility: Secondary | ICD-10-CM

## 2018-03-12 DIAGNOSIS — R2681 Unsteadiness on feet: Secondary | ICD-10-CM

## 2018-03-12 NOTE — Therapy (Signed)
[place] MAIN [facility] SERVICES [address] [place], [place], [address] Phone: [phone]   Fax:  [phone]  Physical Therapy Treatment  Patient Details  Name: [name] MRN: [id number] Date of Birth: 07-Sep-1929 Referring Provider (PT): Ria Bush   Encounter Date: 03/12/2018  PT End of Session - 03/12/18 1447    Visit Number  17    Number of Visits  17    Date for PT Re-Evaluation  03/28/16    Authorization Type  6/10    PT Start Time  0235    PT Stop Time  0315    PT Time Calculation (min)  40 min    Equipment Utilized During Treatment  Gait belt    Activity Tolerance  Patient tolerated treatment well;Patient limited by fatigue    Behavior During Therapy  WFL for tasks assessed/performed       Past Medical History:  Diagnosis Date  . Abdominal aortic atherosclerosis ([place]) 09/2015   by xray  . Anxiety   . BCC (basal cell carcinoma of skin) 05/2011   on nose  . Breast lesion    a. diagnosed with complex sclerosing lesion with calcifications of the right breast in 08/2016  . Chest pain    a. nuclear stress test 03/2012: normal  . DDD (degenerative disc disease), lumbosacral 12/06/1992  . Gallstones 09/2015   incidentally by xray  . GERD (gastroesophageal reflux disease) 05/1999  . HERPES ZOSTER 04/13/2007   Qualifier: Diagnosis of  By: [name], [name]   . History of shingles    ophthalmic, takes acyclovir daily  . Hyperlipidemia 12/2003  . Hypertension 1990  . Mitral regurgitation    a. echo 03/2012: EF 60-65%, no RWMA, mild to mod AI/MR, mod TR, PASP 37 mmHg  . Osteoarthritis 08/21/1989  . Osteoporosis with fracture 11/1997   with compression fracture T12  . Persistent atrial fibrillation 03/16/2012   a. CHADS2VASc => 5 (HTN, age x 2, vascular disease, sex category)-->Eliquis 5 BID;  b. Recurrent AF in 06/2016;  c. 01/2017 s/p DCCV;  d. 02/2017 recurrent AFib->Amio added->DCCV; e. 03/2017 recurrent  AFib, s/p reload of amio and DCCV 04/02/2017  . Rheumatoid arthritis ([place])   . RLS (restless legs syndrome)     Past Surgical History:  Procedure Laterality Date  . ABDOMINAL HYSTERECTOMY  Age 13 - 35   S/P TAH  . abdominal ultrasound  04/23/2004   Gallstones  . BREAST LUMPECTOMY WITH RADIOACTIVE SEED LOCALIZATION Right 10/2016   breast lumpectomy with radioactive seed localization and margin assessment for complex sclerosing lesion [facility])  . BREAST LUMPECTOMY WITH RADIOACTIVE SEED LOCALIZATION Right 11/05/2016   Procedure: RIGHT BREAST LUMPECTOMY WITH RADIOACTIVE SEED LOCALIZATION;  Surgeon: Fanny Skates, MD;  Location: [place];  Service: General;  Laterality: Right;  . CARDIOVERSION N/A 01/31/2017   Procedure: CARDIOVERSION;  Surgeon: Wellington Hampshire, MD;  Location: [facility];  Service: Cardiovascular;  Laterality: N/A;  . CARDIOVERSION N/A 03/03/2017   Procedure: CARDIOVERSION;  Surgeon: Wellington Hampshire, MD;  Location: [place] [facility];  Service: Cardiovascular;  Laterality: N/A;  . CARDIOVERSION N/A 04/02/2017   Procedure: CARDIOVERSION;  Surgeon: Wellington Hampshire, MD;  Location: [facility];  Service: Cardiovascular;  Laterality: N/A;  . CARDIOVERSION N/A 01/16/2018   Procedure: CARDIOVERSION;  Surgeon: Wellington Hampshire, MD;  Location: [facility];  Service: Cardiovascular;  Laterality: N/A;  . CATARACT EXTRACTION  05/2010   left eye  . CERVICAL DISCECTOMY  1992  Fusion (Dr. Joya Salm)  . ESI Bilateral 01/2016   S1 transforaminal ESI x3  . ESOPHAGOGASTRODUODENOSCOPY  01/01/2002   with ulcer, bx. neg; + stricture gastric ulcer with hemorrhage  . ESOPHAGOGASTRODUODENOSCOPY  04/23/2004   Esophageal stricture -- dilated  . nuclear stress test  03/2012   no ischemia  . SKIN CANCER EXCISION  05/20/2011   BCC from nose  . US ECHOCARDIOGRAPHY  03/2012   in flutter, EF 60%, mild-mod aortic, mitral, tricuspid regurg, mildly dilated LA    There were no vitals filed for  this visit.  Subjective Assessment - 03/12/18 1446    Subjective  Pt is not doing alright today, reports . She is having right hip pain 8/10. Non of her medications have changed since her prior session.     Patient is accompained by:  Family member   daughter    Pertinent History  Patient has been having balance defictis and has had several falls in the past year.     Patient Stated Goals  "I just want it to quit hurting."    Currently in Pain?  Yes    Pain Score  8     Pain Location  Hip    Pain Orientation  Right    Pain Descriptors / Indicators  Aching    Pain Type  Chronic pain    Pain Onset  More than a month ago       Manual therapy: Warm up nu-step x 3 mins  STM to right and left hip flexor musculature for pain relief. Prone stretching for hip flexors x 30 sec x 3 BLE  Roller stick to left hip flexor and right hip flexors with minimal force Reviewed correct rolling techniques for supine to sit  Reviewed HEP Patient reports pain 0/10 following treatment to B groin                       PT Education - 03/12/18 1631    Education provided  Yes    Education Details  HEP    Person(s) Educated  Patient    Methods  Explanation    Comprehension  Verbalized understanding       PT Short Term Goals - 01/21/18 1441      PT SHORT TERM GOAL #1   Title  Patient will be independent in home exercise program to improve strength/mobility for better functional independence with ADLs.    Time  4    Period  Weeks    Status  Achieved    Target Date  01/06/18      PT SHORT TERM GOAL #2   Title  Patient (< 18 years old) will complete five times sit to stand test in < 10 seconds indicating an increased LE strength and improved balance.    Baseline  01/21/18=UE support 41.3 s    Time  4    Period  Weeks    Status  On-going    Target Date  01/06/18        PT Long Term Goals - 02/19/18 1614      PT LONG TERM GOAL #1   Title  Patient will increase Berg Balance  score by > 6 points to demonstrate decreased fall risk during functional activities.    Baseline  01/21/18= 46/56, 02/19/18=48/56    Time  8    Period  Weeks    Status  Achieved    Target Date  03/28/16  PT LONG TERM GOAL #2   Title  Patient will increase six minute walk test distance to >1000 for progression to community ambulator and improve gait ability    Baseline  01/21/18= 57ft w/ rollator,02/19/18 625 feet    Time  8    Period  Weeks    Status  On-going    Target Date  03/28/18      PT LONG TERM GOAL #3   Title  Patient will increase 10 meter walk test to >1.62m/s as to improve gait speed for better community ambulation and to reduce fall risk.    Baseline  01/21/18= 0.82 m/s (no AD), 02/19/18 . 95 m/sec    Time  8    Period  Weeks    Status  On-going    Target Date  03/28/18            Plan - 03/12/18 1448    Clinical Impression Statement  Patient is limited today with 8/10 right hip pain that began yesterday after she decorated her christmas tree. She repsonds to [facility]  therapy for pain relief of right hip flexor muscle pain. She was instructed in stretching and  positioning for HEp . She will conitnue to benefit from skilled PT to improve mobility.     Rehab Potential  Good    PT Frequency  2x / week    PT Duration  8 weeks    PT Treatment/Interventions  Balance training;Neuromuscular re-education;Therapeutic exercise;Patient/family education;Manual techniques;Therapeutic activities;Moist Heat;Aquatic Therapy;Functional mobility training    PT Next Visit Plan  HEP progression, balance training, strengthening    PT Home Exercise Plan  heel raises, sit to stand    Consulted and Agree with Plan of Care  Patient;Family member/caregiver       Patient will benefit from skilled therapeutic intervention in order to improve the following deficits and impairments:  Decreased balance, Decreased endurance, Abnormal gait, Decreased mobility, Difficulty walking, Dizziness,  Pain, Impaired flexibility, Decreased strength, Decreased activity tolerance  Visit Diagnosis: Unsteadiness on feet  Abnormality of gait and mobility  Muscle weakness (generalized)     Problem List Patient Active Problem List   Diagnosis Date Noted  . Vitamin D deficiency 02/25/2018  . Peripheral neuropathy 12/04/2017  . General unsteadiness 10/01/2017  . Constipation 10/01/2017  . Esophageal dysphagia 04/08/2017  . Chronic radicular pain of lower back 04/08/2017  . Fall 03/04/2017  . Encounter for chronic pain management 11/11/2016  . Breast mass, right 09/29/2016  . Abdominal aortic atherosclerosis ([place]) 09/07/2015  . Insomnia 06/08/2015  . Health maintenance examination 12/08/2014  . Advanced care planning/counseling discussion 12/08/2014  . Chronic leg pain 10/13/2014  . Fatigue 10/28/2013  . Persistent atrial fibrillation 04/24/2013  . Medicare annual wellness visit, subsequent 04/14/2012  . Atypical chest pain 03/16/2012  . RLS (restless legs syndrome) 02/07/2012  . DDD (degenerative disc disease), lumbar 12/18/2009  . MDD (major depressive disorder), recurrent episode ([place]) 12/10/2007  . POSTHERPETIC NEURALGIA 04/20/2007  . HLD (hyperlipidemia) 11/24/2006  . PLMD (periodic limb movement disorder) 11/24/2006  . Essential hypertension 11/24/2006  . GERD 11/24/2006  . GASTRIC ULCER 11/24/2006  . Osteoarthritis 11/24/2006  . Osteoporosis with pathological fracture 11/24/2006    Alanson Puls, PT DPT 03/12/2018, 5:12 PM  [place] MAIN [facility] SERVICES [address] [place], [place], [address] Phone: [phone]   Fax:  [phone]  Name: [name] MRN: [id number] Date of Birth: [birth year]

## 2018-03-16 ENCOUNTER — Other Ambulatory Visit: Payer: Self-pay | Admitting: Family Medicine

## 2018-03-17 ENCOUNTER — Ambulatory Visit: Payer: Medicare Other | Admitting: Physical Therapy

## 2018-03-17 ENCOUNTER — Encounter: Payer: Self-pay | Admitting: Physical Therapy

## 2018-03-17 DIAGNOSIS — R2681 Unsteadiness on feet: Secondary | ICD-10-CM | POA: Diagnosis not present

## 2018-03-17 DIAGNOSIS — R269 Unspecified abnormalities of gait and mobility: Secondary | ICD-10-CM | POA: Diagnosis not present

## 2018-03-17 DIAGNOSIS — M6281 Muscle weakness (generalized): Secondary | ICD-10-CM | POA: Diagnosis not present

## 2018-03-17 NOTE — Therapy (Signed)
[place] MAIN [facility] SERVICES [address] [place], [place], [address] Phone: [phone]   Fax:  [phone]  Physical Therapy Treatment  Patient Details  Name: [name] MRN: [id number] Date of Birth: March 06, 1930 Referring Provider (PT): Ria Bush   Encounter Date: 03/17/2018  PT End of Session - 03/17/18 1509    Visit Number  18    Number of Visits  33    Date for PT Re-Evaluation  03/28/16    Authorization Type  6/10    PT Start Time  0253    PT Stop Time  0331    PT Time Calculation (min)  38 min    Equipment Utilized During Treatment  Gait belt    Activity Tolerance  Patient tolerated treatment well;Patient limited by fatigue    Behavior During Therapy  [facility] for tasks assessed/performed       Past Medical History:  Diagnosis Date  . Abdominal aortic atherosclerosis ([place]) 09/2015   by xray  . Anxiety   . BCC (basal cell carcinoma of skin) 05/2011   on nose  . Breast lesion    a. diagnosed with complex sclerosing lesion with calcifications of the right breast in 08/2016  . Chest pain    a. nuclear stress test 03/2012: normal  . DDD (degenerative disc disease), lumbosacral 12/06/1992  . Gallstones 09/2015   incidentally by xray  . GERD (gastroesophageal reflux disease) 05/1999  . HERPES ZOSTER 04/13/2007   Qualifier: Diagnosis of  By: [name], [name]   . History of shingles    ophthalmic, takes acyclovir daily  . Hyperlipidemia 12/2003  . Hypertension 1990  . Mitral regurgitation    a. echo 03/2012: EF 60-65%, no RWMA, mild to mod AI/MR, mod TR, PASP 37 mmHg  . Osteoarthritis 08/21/1989  . Osteoporosis with fracture 11/1997   with compression fracture T12  . Persistent atrial fibrillation 03/16/2012   a. CHADS2VASc => 5 (HTN, age x 2, vascular disease, sex category)-->Eliquis 5 BID;  b. Recurrent AF in 06/2016;  c. 01/2017 s/p DCCV;  d. 02/2017 recurrent AFib->Amio added->DCCV; e. 03/2017 recurrent  AFib, s/p reload of amio and DCCV 04/02/2017  . Rheumatoid arthritis ([place])   . RLS (restless legs syndrome)     Past Surgical History:  Procedure Laterality Date  . ABDOMINAL HYSTERECTOMY  Age 84 - 84   S/P TAH  . abdominal ultrasound  04/23/2004   Gallstones  . BREAST LUMPECTOMY WITH RADIOACTIVE SEED LOCALIZATION Right 10/2016   breast lumpectomy with radioactive seed localization and margin assessment for complex sclerosing lesion [facility])  . BREAST LUMPECTOMY WITH RADIOACTIVE SEED LOCALIZATION Right 11/05/2016   Procedure: RIGHT BREAST LUMPECTOMY WITH RADIOACTIVE SEED LOCALIZATION;  Surgeon: Fanny Skates, MD;  Location: [place];  Service: General;  Laterality: Right;  . CARDIOVERSION N/A 01/31/2017   Procedure: CARDIOVERSION;  Surgeon: Wellington Hampshire, MD;  Location: [facility];  Service: Cardiovascular;  Laterality: N/A;  . CARDIOVERSION N/A 03/03/2017   Procedure: CARDIOVERSION;  Surgeon: Wellington Hampshire, MD;  Location: [place] [facility];  Service: Cardiovascular;  Laterality: N/A;  . CARDIOVERSION N/A 04/02/2017   Procedure: CARDIOVERSION;  Surgeon: Wellington Hampshire, MD;  Location: [facility];  Service: Cardiovascular;  Laterality: N/A;  . CARDIOVERSION N/A 01/16/2018   Procedure: CARDIOVERSION;  Surgeon: Wellington Hampshire, MD;  Location: [facility];  Service: Cardiovascular;  Laterality: N/A;  . CATARACT EXTRACTION  05/2010   left eye  . CERVICAL DISCECTOMY  1992  Fusion (Dr. Joya Salm)  . ESI Bilateral 01/2016   S1 transforaminal ESI x3  . ESOPHAGOGASTRODUODENOSCOPY  01/01/2002   with ulcer, bx. neg; + stricture gastric ulcer with hemorrhage  . ESOPHAGOGASTRODUODENOSCOPY  04/23/2004   Esophageal stricture -- dilated  . nuclear stress test  03/2012   no ischemia  . SKIN CANCER EXCISION  05/20/2011   BCC from nose  . US ECHOCARDIOGRAPHY  03/2012   in flutter, EF 60%, mild-mod aortic, mitral, tricuspid regurg, mildly dilated LA    There were no vitals filed for  this visit.  Subjective Assessment - 03/17/18 1508    Subjective  Pt is not doing alright today, reports . She is having right hip pain 8/10. Non of her medications have changed since her prior session.     Patient is accompained by:  Family member   daughter    Pertinent History  Patient has been having balance defictis and has had several falls in the past year.     Patient Stated Goals  "I just want it to quit hurting."    Pain Score  8     Pain Location  Hip    Pain Orientation  Right    Pain Descriptors / Indicators  Aching    Pain Onset  More than a month ago       Treatment: Standing heel raises x 15 with 2# ankle weights  LAQ with 2# ankle weights with 3 second holds x10 each LE  STS from chair without UE support 2x10  Mini squats x10 with no UE support  Heel raises x20 reps bilaterally, BUE support in // bars, supervision for safety Marching x15 reps each leg, BUE support in // bars, supervision for safety Tandem stance, eyes open, x10 sec holds with each foot in rear x2 bouts each foot, supervision for safety, no UE support Tandem stance, eyes closed, x10 sec holds with each foot in rear x2 bouts each foot, supervision for safety, no UE support  VCs for proper technique and positioning for each exercise  Cues for core activation of TA in standing to reduce back pain and for improved posture. Patient needed several sitting rest breaks during session.                       PT Education - 03/17/18 1509    Education provided  Yes    Education Details  HEP    Person(s) Educated  Patient    Methods  Explanation    Comprehension  Verbalized understanding       PT Short Term Goals - 01/21/18 1441      PT SHORT TERM GOAL #1   Title  Patient will be independent in home exercise program to improve strength/mobility for better functional independence with ADLs.    Time  4    Period  Weeks    Status  Achieved    Target Date  01/06/18      PT SHORT  TERM GOAL #2   Title  Patient (< 25 years old) will complete five times sit to stand test in < 10 seconds indicating an increased LE strength and improved balance.    Baseline  01/21/18=UE support 41.3 s    Time  4    Period  Weeks    Status  On-going    Target Date  01/06/18        PT Long Term Goals - 02/19/18 1614      PT  LONG TERM GOAL #1   Title  Patient will increase Berg Balance score by > 6 points to demonstrate decreased fall risk during functional activities.    Baseline  01/21/18= 46/56, 02/19/18=48/56    Time  8    Period  Weeks    Status  Achieved    Target Date  03/28/16      PT LONG TERM GOAL #2   Title  Patient will increase six minute walk test distance to >1000 for progression to community ambulator and improve gait ability    Baseline  01/21/18= 552ft w/ rollator,02/19/18 625 feet    Time  8    Period  Weeks    Status  On-going    Target Date  03/28/18      PT LONG TERM GOAL #3   Title  Patient will increase 10 meter walk test to >1.17m/s as to improve gait speed for better community ambulation and to reduce fall risk.    Baseline  01/21/18= 0.82 m/s (no AD), 02/19/18 . 95 m/sec    Time  8    Period  Weeks    Status  On-going    Target Date  03/28/18            Plan - 03/17/18 1511    Clinical Impression Statement  Patient is limited today with 8/10 right hip pain that began yesterday after she decorated her christmas tree. She repsonds to [facility] therapy for pain relief of right hip flexor muscle pain. She was instructed in stretching and positioning for HEp . She will conitnue to benefit from skilled PT to improve mobility.     Rehab Potential  Good    PT Frequency  2x / week    PT Duration  8 weeks    PT Treatment/Interventions  Balance training;Neuromuscular re-education;Therapeutic exercise;Patient/family education;Manual techniques;Therapeutic activities;Moist Heat;Aquatic Therapy;Functional mobility training    PT Next Visit Plan  HEP  progression, balance training, strengthening    PT Home Exercise Plan  heel raises, sit to stand    Consulted and Agree with Plan of Care  Patient;Family member/caregiver       Patient will benefit from skilled therapeutic intervention in order to improve the following deficits and impairments:  Decreased balance, Decreased endurance, Abnormal gait, Decreased mobility, Difficulty walking, Dizziness, Pain, Impaired flexibility, Decreased strength, Decreased activity tolerance  Visit Diagnosis: Unsteadiness on feet  Abnormality of gait and mobility  Muscle weakness (generalized)     Problem List Patient Active Problem List   Diagnosis Date Noted  . Vitamin D deficiency 02/25/2018  . Peripheral neuropathy 12/04/2017  . General unsteadiness 10/01/2017  . Constipation 10/01/2017  . Esophageal dysphagia 04/08/2017  . Chronic radicular pain of lower back 04/08/2017  . Fall 03/04/2017  . Encounter for chronic pain management 11/11/2016  . Breast mass, right 09/29/2016  . Abdominal aortic atherosclerosis ([place]) 09/07/2015  . Insomnia 06/08/2015  . Health maintenance examination 12/08/2014  . Advanced care planning/counseling discussion 12/08/2014  . Chronic leg pain 10/13/2014  . Fatigue 10/28/2013  . Persistent atrial fibrillation 04/24/2013  . Medicare annual wellness visit, subsequent 04/14/2012  . Atypical chest pain 03/16/2012  . RLS (restless legs syndrome) 02/07/2012  . DDD (degenerative disc disease), lumbar 12/18/2009  . MDD (major depressive disorder), recurrent episode ([place]) 12/10/2007  . POSTHERPETIC NEURALGIA 04/20/2007  . HLD (hyperlipidemia) 11/24/2006  . PLMD (periodic limb movement disorder) 11/24/2006  . Essential hypertension 11/24/2006  . GERD 11/24/2006  . GASTRIC ULCER 11/24/2006  . Osteoarthritis  11/24/2006  . Osteoporosis with pathological fracture 11/24/2006    Alanson Puls, PT DPT 03/17/2018, 3:12 PM  [place] MAIN [facility] SERVICES [address] [place], [place], [address] Phone: [phone]   Fax:  [phone]  Name: ANALIE KATZMAN MRN: [id number] Date of Birth: July 02, 1929

## 2018-03-19 ENCOUNTER — Encounter: Payer: Self-pay | Admitting: Physical Therapy

## 2018-03-19 ENCOUNTER — Ambulatory Visit: Payer: Medicare Other | Admitting: Physical Therapy

## 2018-03-19 DIAGNOSIS — M6281 Muscle weakness (generalized): Secondary | ICD-10-CM

## 2018-03-19 DIAGNOSIS — R269 Unspecified abnormalities of gait and mobility: Secondary | ICD-10-CM | POA: Diagnosis not present

## 2018-03-19 DIAGNOSIS — R2681 Unsteadiness on feet: Secondary | ICD-10-CM

## 2018-03-19 NOTE — Therapy (Signed)
[place] MAIN [facility] SERVICES [address] [place], [place], [address] Phone: [phone]   Fax:  [phone]  Physical Therapy Treatment  Patient Details  Name: [name] MRN: [id number] Date of Birth: [birth year] Referring Provider (PT): Ria Bush   Encounter Date: 03/19/2018  PT End of Session - 03/19/18 1501    Visit Number  20    Number of Visits  33    Date for PT Re-Evaluation  03/28/16    Authorization Type  7/10    PT Start Time  0230    PT Stop Time  0310    PT Time Calculation (min)  40 min    Equipment Utilized During Treatment  Gait belt    Activity Tolerance  Patient tolerated treatment well;Patient limited by fatigue    Behavior During Therapy  WFL for tasks assessed/performed       Past Medical History:  Diagnosis Date  . Abdominal aortic atherosclerosis ([place]) 09/2015   by xray  . Anxiety   . BCC (basal cell carcinoma of skin) 05/2011   on nose  . Breast lesion    a. diagnosed with complex sclerosing lesion with calcifications of the right breast in 08/2016  . Chest pain    a. nuclear stress test 03/2012: normal  . DDD (degenerative disc disease), lumbosacral 12/06/1992  . Gallstones 09/2015   incidentally by xray  . GERD (gastroesophageal reflux disease) 05/1999  . HERPES ZOSTER 04/13/2007   Qualifier: Diagnosis of  By: [name], [name]   . History of shingles    ophthalmic, takes acyclovir daily  . Hyperlipidemia 12/2003  . Hypertension 1990  . Mitral regurgitation    a. echo 03/2012: EF 60-65%, no RWMA, mild to mod AI/MR, mod TR, PASP 37 mmHg  . Osteoarthritis 08/21/1989  . Osteoporosis with fracture 11/1997   with compression fracture T12  . Persistent atrial fibrillation 03/16/2012   a. CHADS2VASc => 5 (HTN, age x 2, vascular disease, sex category)-->Eliquis 5 BID;  b. Recurrent AF in 06/2016;  c. 01/2017 s/p DCCV;  d. 02/2017 recurrent AFib->Amio added->DCCV; e. 03/2017 recurrent  AFib, s/p reload of amio and DCCV 04/02/2017  . Rheumatoid arthritis ([place])   . RLS (restless legs syndrome)     Past Surgical History:  Procedure Laterality Date  . ABDOMINAL HYSTERECTOMY  Age 73 - 57   S/P TAH  . abdominal ultrasound  04/23/2004   Gallstones  . BREAST LUMPECTOMY WITH RADIOACTIVE SEED LOCALIZATION Right 10/2016   breast lumpectomy with radioactive seed localization and margin assessment for complex sclerosing lesion [facility])  . BREAST LUMPECTOMY WITH RADIOACTIVE SEED LOCALIZATION Right 11/05/2016   Procedure: RIGHT BREAST LUMPECTOMY WITH RADIOACTIVE SEED LOCALIZATION;  Surgeon: Fanny Skates, MD;  Location: [place];  Service: General;  Laterality: Right;  . CARDIOVERSION N/A 01/31/2017   Procedure: CARDIOVERSION;  Surgeon: Wellington Hampshire, MD;  Location: [facility];  Service: Cardiovascular;  Laterality: N/A;  . CARDIOVERSION N/A 03/03/2017   Procedure: CARDIOVERSION;  Surgeon: Wellington Hampshire, MD;  Location: [place] [facility];  Service: Cardiovascular;  Laterality: N/A;  . CARDIOVERSION N/A 04/02/2017   Procedure: CARDIOVERSION;  Surgeon: Wellington Hampshire, MD;  Location: [facility];  Service: Cardiovascular;  Laterality: N/A;  . CARDIOVERSION N/A 01/16/2018   Procedure: CARDIOVERSION;  Surgeon: Wellington Hampshire, MD;  Location: [facility];  Service: Cardiovascular;  Laterality: N/A;  . CATARACT EXTRACTION  05/2010   left eye  . CERVICAL DISCECTOMY  1992  Fusion (Dr. Joya Salm)  . ESI Bilateral 01/2016   S1 transforaminal ESI x3  . ESOPHAGOGASTRODUODENOSCOPY  01/01/2002   with ulcer, bx. neg; + stricture gastric ulcer with hemorrhage  . ESOPHAGOGASTRODUODENOSCOPY  04/23/2004   Esophageal stricture -- dilated  . nuclear stress test  03/2012   no ischemia  . SKIN CANCER EXCISION  05/20/2011   BCC from nose  . US ECHOCARDIOGRAPHY  03/2012   in flutter, EF 60%, mild-mod aortic, mitral, tricuspid regurg, mildly dilated LA    There were no vitals filed for  this visit.  Subjective Assessment - 03/19/18 1500    Subjective  Pt is not doing alright today, reports . She is having right hip pain 8/10. Non of her medications have changed since her prior session.     Patient is accompained by:  Family member   daughter    Pertinent History  Patient has been having balance defictis and has had several falls in the past year.     Patient Stated Goals  "I just want it to quit hurting."    Currently in Pain?  Yes    Pain Score  8     Pain Location  Hip    Pain Orientation  Right    Pain Descriptors / Indicators  Aching    Pain Type  Chronic pain    Pain Onset  More than a month ago    Aggravating Factors   walking    Effect of Pain on Daily Activities  difficult to walk       Treatment: Nu-step x 5 mins with MH to right and left buttocks   Stepping over 2 hurdle fwd side to side x 10   Stepping over 1 hurdle bwd x10 cues for posture, increased hip flexion   Standing hip abd without resistance due to weakness 2x5 bilaterally   Standing hip extension without GTB due to increased fatigue today 2x5 BLE    Heel raises x20 reps bilaterally, BUE support in // bars, supervision for safety  Marching x15 reps each leg, BUE support in // bars, supervision for safety  Tandem stance, eyes open, x10 sec holds with each foot in rear x2 bouts each foot, supervision for safety, no UE support  Tandem stance, eyes closed, x10 sec holds with each foot in rear x2 bouts each foot, supervision for safety, no UE support  -VCs for proper technique and positioning for each exercise                       PT Education - 03/19/18 1501    Education provided  Yes    Education Details  heat    Person(s) Educated  Patient    Methods  Explanation    Comprehension  Verbalized understanding       PT Short Term Goals - 01/21/18 1441      PT SHORT TERM GOAL #1   Title  Patient will be independent in home exercise program to improve  strength/mobility for better functional independence with ADLs.    Time  4    Period  Weeks    Status  Achieved    Target Date  01/06/18      PT SHORT TERM GOAL #2   Title  Patient (< 38 years old) will complete five times sit to stand test in < 10 seconds indicating an increased LE strength and improved balance.    Baseline  01/21/18=UE support 41.3 s  Time  4    Period  Weeks    Status  On-going    Target Date  01/06/18        PT Long Term Goals - 02/19/18 1614      PT LONG TERM GOAL #1   Title  Patient will increase Berg Balance score by > 6 points to demonstrate decreased fall risk during functional activities.    Baseline  01/21/18= 46/56, 02/19/18=48/56    Time  8    Period  Weeks    Status  Achieved    Target Date  03/28/16      PT LONG TERM GOAL #2   Title  Patient will increase six minute walk test distance to >1000 for progression to community ambulator and improve gait ability    Baseline  01/21/18= 550ft w/ rollator,02/19/18 625 feet    Time  8    Period  Weeks    Status  On-going    Target Date  03/28/18      PT LONG TERM GOAL #3   Title  Patient will increase 10 meter walk test to >1.25m/s as to improve gait speed for better community ambulation and to reduce fall risk.    Baseline  01/21/18= 0.82 m/s (no AD), 02/19/18 . 95 m/sec    Time  8    Period  Weeks    Status  On-going    Target Date  03/28/18            Plan - 03/19/18 1501    Clinical Impression Statement  Patient is limited today with 8/10 right hip pain that began yesterday after she decorated her christmas tree. She repsonds to [facility] therapy for pain relief of right hip flexor muscle pain. She was instructed in stretching and positioning for HEp . She will conitnue to benefit from skilled PT to improve mobility.    Rehab Potential  Good    PT Frequency  2x / week    PT Duration  8 weeks    PT Treatment/Interventions  Balance training;Neuromuscular re-education;Therapeutic  exercise;Patient/family education;Manual techniques;Therapeutic activities;Moist Heat;Aquatic Therapy;Functional mobility training    PT Next Visit Plan  HEP progression, balance training, strengthening    PT Home Exercise Plan  heel raises, sit to stand    Consulted and Agree with Plan of Care  Patient;Family member/caregiver       Patient will benefit from skilled therapeutic intervention in order to improve the following deficits and impairments:  Decreased balance, Decreased endurance, Abnormal gait, Decreased mobility, Difficulty walking, Dizziness, Pain, Impaired flexibility, Decreased strength, Decreased activity tolerance  Visit Diagnosis: Unsteadiness on feet  Abnormality of gait and mobility  Muscle weakness (generalized)     Problem List Patient Active Problem List   Diagnosis Date Noted  . Vitamin D deficiency 02/25/2018  . Peripheral neuropathy 12/04/2017  . General unsteadiness 10/01/2017  . Constipation 10/01/2017  . Esophageal dysphagia 04/08/2017  . Chronic radicular pain of lower back 04/08/2017  . Fall 03/04/2017  . Encounter for chronic pain management 11/11/2016  . Breast mass, right 09/29/2016  . Abdominal aortic atherosclerosis ([place]) 09/07/2015  . Insomnia 06/08/2015  . Health maintenance examination 12/08/2014  . Advanced care planning/counseling discussion 12/08/2014  . Chronic leg pain 10/13/2014  . Fatigue 10/28/2013  . Persistent atrial fibrillation 04/24/2013  . Medicare annual wellness visit, subsequent 04/14/2012  . Atypical chest pain 03/16/2012  . RLS (restless legs syndrome) 02/07/2012  . DDD (degenerative disc disease), lumbar 12/18/2009  . MDD (major  depressive disorder), recurrent episode ([place]) 12/10/2007  . POSTHERPETIC NEURALGIA 04/20/2007  . HLD (hyperlipidemia) 11/24/2006  . PLMD (periodic limb movement disorder) 11/24/2006  . Essential hypertension 11/24/2006  . GERD 11/24/2006  . GASTRIC ULCER 11/24/2006  . Osteoarthritis  11/24/2006  . Osteoporosis with pathological fracture 11/24/2006    Alanson Puls, PT DPT 03/19/2018, 3:02 PM  [place] MAIN [facility] SERVICES [address] [place], [place], [address] Phone: [phone]   Fax:  [phone]  Name: [name] MRN: [id number] Date of Birth: [birth year]

## 2018-03-24 ENCOUNTER — Ambulatory Visit: Payer: Medicare Other | Admitting: Physical Therapy

## 2018-03-25 ENCOUNTER — Ambulatory Visit: Payer: Medicare Other

## 2018-03-25 ENCOUNTER — Encounter: Payer: Medicare Other | Admitting: Family Medicine

## 2018-03-26 ENCOUNTER — Ambulatory Visit: Payer: Medicare Other | Admitting: Physical Therapy

## 2018-03-27 ENCOUNTER — Ambulatory Visit (INDEPENDENT_AMBULATORY_CARE_PROVIDER_SITE_OTHER): Payer: Medicare Other

## 2018-03-27 ENCOUNTER — Other Ambulatory Visit: Payer: Self-pay

## 2018-03-27 DIAGNOSIS — I4819 Other persistent atrial fibrillation: Secondary | ICD-10-CM | POA: Diagnosis not present

## 2018-03-30 ENCOUNTER — Telehealth: Payer: Self-pay | Admitting: *Deleted

## 2018-03-30 DIAGNOSIS — I10 Essential (primary) hypertension: Secondary | ICD-10-CM

## 2018-03-30 DIAGNOSIS — Z79899 Other long term (current) drug therapy: Secondary | ICD-10-CM

## 2018-03-30 DIAGNOSIS — I484 Atypical atrial flutter: Secondary | ICD-10-CM

## 2018-03-30 MED ORDER — FUROSEMIDE 20 MG PO TABS: 20.0000 mg | ORAL_TABLET | Freq: Every day | ORAL | 3 refills | Status: DC

## 2018-03-30 NOTE — Telephone Encounter (Signed)
No answer. Left message to call back on daughter's phone number.   Results called to pt. Pt verbalized understanding of results and plan of care. She is aware to start furosemide 20 mg once a day and to go to the [place] on 04/06/18 BMET. Rx sent to pharmacy and lab order entered.  Also, released information to MyChart. Patient will let her daughter know to look there for details and to call us if they have any further questions.

## 2018-03-30 NOTE — Telephone Encounter (Signed)
-----   Message from Rise Mu, PA-C sent at 03/28/2018 11:31 AM EST ----- Echo showed normal pump function with normal wall motion. Mildly leaky aortic valve with a mildly to moderately leaky mitral valve, moderately dilated left atrium, and elevated pressure in the vessels that go from the heart to the lungs. Rhythm was normal sinus.   When compared to prior echo from 11/2016, pump function is the same with slight progression of the leaky aortic and mitral valves which can be monitored with periodic echo. The pressures in the vessels that go from the heart to the lungs was higher (prior 36 mmHg with the pressure on most recent echo being 63 mmHg). Normal is < 30 mmHg.   Please start Lasix 20 mg daily in an effort to help improve pulmonary hypertension. I doubt the increase is in the setting of a PE given she is on Eliquis for her Afib. She has indicated she smoked for 1 year, quitting in 1978. In follow up, we should talk about her seeing a pulmonologist for this as well as for a possible sleep study. She will need a follow up bmet 1 week after starting Lasix to assess her renal function and potassium.

## 2018-04-03 NOTE — Telephone Encounter (Signed)
Patient daughter [name] returning call  Wants some clarification from the mychart message Please call to discuss

## 2018-04-03 NOTE — Telephone Encounter (Signed)
[facility] [place] [facility] per daughter she wants to hold off on Pulm consult until after she meets with Fletcher Anon in Feb. Advised pt to call for any further questions or concerns

## 2018-04-03 NOTE — Telephone Encounter (Signed)
Returned call to pt daughter, okay per DPR.  She received My chart message but wanted more in depth description of results from Echo as well as POC.  She verbalized understanding and will start pt on Lasix

## 2018-04-07 ENCOUNTER — Ambulatory Visit: Payer: Medicare Other | Admitting: Physical Therapy

## 2018-04-09 ENCOUNTER — Ambulatory Visit: Payer: Medicare Other | Admitting: Physical Therapy

## 2018-04-14 ENCOUNTER — Other Ambulatory Visit: Payer: Self-pay | Admitting: Family Medicine

## 2018-04-14 ENCOUNTER — Ambulatory Visit: Payer: Medicare Other | Admitting: Physical Therapy

## 2018-04-14 ENCOUNTER — Other Ambulatory Visit: Payer: Self-pay | Admitting: Physician Assistant

## 2018-04-16 ENCOUNTER — Ambulatory Visit: Payer: Medicare Other | Admitting: Physical Therapy

## 2018-04-21 ENCOUNTER — Ambulatory Visit: Payer: Medicare Other | Admitting: Physical Therapy

## 2018-04-21 ENCOUNTER — Encounter: Payer: Self-pay | Admitting: Family Medicine

## 2018-04-21 NOTE — Progress Notes (Signed)
BP 126/90 (BP Location: Right Arm, Patient Position: Sitting, Cuff Size: Normal)   Pulse 60   Temp 97.8 F (36.6 C) (Oral)   Ht 5' 2.5" (1.588 m) Comment: shoes  Wt 133 lb 8 oz (60.6 kg)   SpO2 96%   BMI 24.03 kg/m    CC: CPE Subjective:    Patient ID: [name], female    DOB: [birth year], 85 y.o.   MRN: [id number]  HPI: [name] is a 85 y.o. female presenting on 04/22/2018 for Annual Exam (Pt 2. Pt accompanied by her daugher-in-law, [name]. )   Annia Belt today for medicare wellness visit. Note will be reviewed.   Last few weeks has felt very well! Sudden burst of energy. Feeling well since she received prolia injection.   On amiodarone since 02/2017. Endorses longstanding cough, but not recently worse.   Preventative: Colon cancer screening - age out. Normal iFOBs last 05/2012 Well woman - she has had a hysterectomy. We have stopped Pap smears.  Mammogram abnormal 2018 s/p R breast lumpectomy of benign complex sclerosing lesion (Dr Renelda Loma) - she will call to see if she is due DEXA T -2.7 spine, -2.6 hip (07/2016) - known osteoporosis with compression fractures, latest after fall 02/2017 now on prolia (started 08/2017) Flu - yearly Td - 2005 Pneumovax 2005, prevnar 01/2015 Shingles shot - not interested in zostavax - h/o ocular shingles. Advanced planning - Living will scanned 04/2017. No HCPOA form. Asked to bring me copy - son Dominica Severin is HCPOA.Does not want extended life support. Seat belt use discussed Sunscreen use discussed. No changing moles on skin.  Ex smoker remotely quit Alcohol - seldom Dentist - doesn't see - has dentures Eye exam tries to go yearly  Lives with grandson who smokes, dog From [place]. Widowed 01/1999; 1st marriage 25 years / 2nd marriage 13 years. One son deceased 01/17/23. Activity: active with dog Diet: good water, fruits/vegetables daily     Relevant past medical, surgical, family and social history reviewed and updated as  indicated. Interim medical history since our last visit reviewed. Allergies and medications reviewed and updated. Outpatient Medications Prior to Visit  Medication Sig Dispense Refill  . acetaminophen (TYLENOL) 500 MG tablet Take 1,000 mg every 8 (eight) hours as needed by mouth for moderate pain.     Marland Kitchen acyclovir (ZOVIRAX) 400 MG tablet Take 400 mg 2 (two) times daily by mouth.     Marland Kitchen amiodarone (PACERONE) 200 MG tablet TAKE ONE TABLET BY MOUTH EVERY DAY 90 tablet 3  . atorvastatin (LIPITOR) 20 MG tablet TAKE ONE TABLET EVERY DAY 90 tablet 0  . DULoxetine (CYMBALTA) 60 MG capsule TAKE 1 CAPSULE EVERY DAY 90 capsule 0  . ELIQUIS 2.5 MG TABS tablet TAKE 1 TABLET BY MOUTH TWICE DAILY 180 tablet 1  . furosemide (LASIX) 20 MG tablet Take 1 tablet (20 mg total) by mouth daily. 90 tablet 3  . gabapentin (NEURONTIN) 100 MG capsule Take 2 capsules (200 mg total) by mouth 3 (three) times daily. 180 capsule 3  . pantoprazole (PROTONIX) 40 MG tablet TAKE 1 TABLET BY MOUTH DAILY 90 tablet 0  . prednisoLONE acetate (PRED FORTE) 1 % ophthalmic suspension Place 1 drop into the left eye daily.     Marland Kitchen rOPINIRole (REQUIP) 1 MG tablet Take 2 tablets (2 mg total) by mouth at bedtime. 180 tablet 0  . tiZANidine (ZANAFLEX) 2 MG tablet Take 2 mg by mouth every evening.     . traMADol (  ULTRAM) 50 MG tablet Take 1 tablet (50 mg total) by mouth 2 (two) times daily.  0  . Vitamin D, Ergocalciferol, (DRISDOL) 1.25 MG (50000 UT) CAPS capsule TAKE 1 CAPSULE BY MOUTH ONCE WEEKLY 12 capsule 0  . metoprolol tartrate (LOPRESSOR) 25 MG tablet Take 1 tablet (25 mg total) by mouth 2 (two) times daily. 180 tablet 3  . denosumab (PROLIA) 60 MG/ML SOSY injection Inject 60 mg into the skin once for 1 dose. 1 mL 0   No facility-administered medications prior to visit.      Per HPI unless specifically indicated in ROS section below Review of Systems  Constitutional: Negative for activity change, appetite change, chills, fatigue, fever  and unexpected weight change.  HENT: Negative for hearing loss.   Eyes: Negative for visual disturbance.  Respiratory: Positive for cough (dry - but denies dyspnea). Negative for chest tightness, shortness of breath and wheezing.        She is on amiodarone  Cardiovascular: Negative for chest pain, palpitations and leg swelling.  Gastrointestinal: Negative for abdominal distention, abdominal pain, blood in stool, constipation, diarrhea, nausea and vomiting.  Genitourinary: Negative for difficulty urinating and hematuria.  Musculoskeletal: Positive for back pain (chronic). Negative for arthralgias, myalgias and neck pain.  Skin: Negative for rash.  Neurological: Negative for dizziness, seizures, syncope and headaches.  Hematological: Negative for adenopathy. Bruises/bleeds easily.  Psychiatric/Behavioral: Negative for dysphoric mood. The patient is not nervous/anxious.    Objective:    BP 126/90 (BP Location: Right Arm, Patient Position: Sitting, Cuff Size: Normal)   Pulse 60   Temp 97.8 F (36.6 C) (Oral)   Ht 5' 2.5" (1.588 m) Comment: shoes  Wt 133 lb 8 oz (60.6 kg)   SpO2 96%   BMI 24.03 kg/m   Wt Readings from Last 3 Encounters:  04/22/18 133 lb 8 oz (60.6 kg)  04/22/18 133 lb 8 oz (60.6 kg)  02/26/18 132 lb 12 oz (60.2 kg)    Physical Exam Vitals signs and nursing note reviewed.  Constitutional:      General: She is not in acute distress.    Appearance: She is well-developed.     Comments: Uses walker. Has life alert necklace.   HENT:     Head: Normocephalic and atraumatic.     Right Ear: Hearing, tympanic membrane, ear canal and external ear normal.     Left Ear: Hearing, tympanic membrane, ear canal and external ear normal.     Nose: Nose normal.     Mouth/Throat:     Pharynx: Uvula midline. No oropharyngeal exudate or posterior oropharyngeal erythema.  Eyes:     General: No scleral icterus.    Conjunctiva/sclera: Conjunctivae normal.     Pupils: Pupils are equal,  round, and reactive to light.  Neck:     Musculoskeletal: Normal range of motion and neck supple.     Vascular: No carotid bruit.  Cardiovascular:     Rate and Rhythm: Normal rate and regular rhythm.     Pulses:          Radial pulses are 2+ on the right side and 2+ on the left side.     Heart sounds: Normal heart sounds. No murmur.  Pulmonary:     Effort: Pulmonary effort is normal. No respiratory distress.     Breath sounds: Normal breath sounds. No wheezing or rales.  Abdominal:     General: Bowel sounds are normal. There is no distension.     Palpations: Abdomen  is soft. There is no mass.     Tenderness: There is no abdominal tenderness. There is no guarding or rebound.  Musculoskeletal: Normal range of motion.  Lymphadenopathy:     Cervical: No cervical adenopathy.  Skin:    General: Skin is warm and dry.     Findings: No rash.  Neurological:     Mental Status: She is alert and oriented to person, place, and time.     Comments: CN grossly intact, station and gait intact  Psychiatric:        Behavior: Behavior normal.        Thought Content: Thought content normal.        Judgment: Judgment normal.       Results for orders placed or performed in visit on 02/11/18  VITAMIN D 25 Hydroxy (Vit-D Deficiency, Fractures)  Result Value Ref Range   VITD 52.06 30.00 - 100.00 ng/mL  Lipid panel  Result Value Ref Range   Cholesterol 135 0 - 200 mg/dL   Triglycerides 113.0 0.0 - 149.0 mg/dL   HDL 59.20 >39.00 mg/dL   VLDL 22.6 0.0 - 40.0 mg/dL   LDL Cholesterol 53 0 - 99 mg/dL   Total CHOL/HDL Ratio 2    NonHDL 75.95   Calcium  Result Value Ref Range   Calcium 9.3 8.4 - 10.5 mg/dL   Assessment & Plan:   Problem List Items Addressed This Visit    Persistent atrial fibrillation    Continue amiodarone, eliquis, metoprolol. She has had recurrent cardioversions.      Osteoporosis with pathological fracture    Stable period on prolia, she feels well. Continue this. H/o  compression fractures.       MDD (major depressive disorder), recurrent episode ([place])    Stable period on cymbalta      HLD (hyperlipidemia)    Chronic, stable on current regimen  The ASCVD Risk score Mikey Bussing DC Jr., et al., 2013) failed to calculate for the following reasons:   The 2013 ASCVD risk score is only valid for ages 79 to 42       Health maintenance examination - Primary    Preventative protocols reviewed and updated unless pt declined. Discussed healthy diet and lifestyle.       GERD    Continue protonix in setting of h/o gastric ulcer and current eliquis use.       Essential hypertension    Chronic, stable. Continue current regimen of lasix and metoprolol.       DDD (degenerative disc disease), lumbar    Appreciate PMR care.       Chronic radicular pain of lower back   Breast mass, right    H/o lumpectomy 2018 for benign complex sclerosing lesion R breast - overdue for f/u - will order mammogram.       Advanced care planning/counseling discussion    Advanced planning - Living will scanned 04/2017. No HCPOA form. Asked to bring me copy - son Dominica Severin is HCPOA.Does not want extended life support.      Abdominal aortic atherosclerosis (HCC)    Continue statin.        Other Visit Diagnoses    Breast cancer screening       Relevant Orders   MM Digital Screening       No orders of the defined types were placed in this encounter.  Orders Placed This Encounter  Procedures  . MM Digital Screening    Standing Status:   Future  Standing Expiration Date:   06/21/2019    Order Specific Question:   Reason for Exam (SYMPTOM  OR DIAGNOSIS REQUIRED)    Answer:   screening mammogram    Order Specific Question:   Preferred imaging location?    Answer:   [facility]    Follow up plan: Return in about 6 months (around 10/21/2018) for follow up visit.  Ria Bush, MD

## 2018-04-22 ENCOUNTER — Ambulatory Visit (INDEPENDENT_AMBULATORY_CARE_PROVIDER_SITE_OTHER): Payer: Medicare Other

## 2018-04-22 ENCOUNTER — Encounter: Payer: Self-pay | Admitting: Family Medicine

## 2018-04-22 ENCOUNTER — Ambulatory Visit (INDEPENDENT_AMBULATORY_CARE_PROVIDER_SITE_OTHER): Payer: Medicare Other | Admitting: Family Medicine

## 2018-04-22 VITALS — BP 126/90 | HR 60 | Temp 97.8°F | Ht 62.5 in | Wt 133.5 lb

## 2018-04-22 DIAGNOSIS — Z7189 Other specified counseling: Secondary | ICD-10-CM | POA: Diagnosis not present

## 2018-04-22 DIAGNOSIS — M8000XD Age-related osteoporosis with current pathological fracture, unspecified site, subsequent encounter for fracture with routine healing: Secondary | ICD-10-CM

## 2018-04-22 DIAGNOSIS — I700 Atherosclerosis of aorta: Secondary | ICD-10-CM

## 2018-04-22 DIAGNOSIS — E782 Mixed hyperlipidemia: Secondary | ICD-10-CM | POA: Diagnosis not present

## 2018-04-22 DIAGNOSIS — I10 Essential (primary) hypertension: Secondary | ICD-10-CM

## 2018-04-22 DIAGNOSIS — Z0000 Encounter for general adult medical examination without abnormal findings: Secondary | ICD-10-CM | POA: Diagnosis not present

## 2018-04-22 DIAGNOSIS — N6310 Unspecified lump in the right breast, unspecified quadrant: Secondary | ICD-10-CM

## 2018-04-22 DIAGNOSIS — F331 Major depressive disorder, recurrent, moderate: Secondary | ICD-10-CM

## 2018-04-22 DIAGNOSIS — Z1239 Encounter for other screening for malignant neoplasm of breast: Secondary | ICD-10-CM

## 2018-04-22 DIAGNOSIS — G8929 Other chronic pain: Secondary | ICD-10-CM

## 2018-04-22 DIAGNOSIS — M5416 Radiculopathy, lumbar region: Secondary | ICD-10-CM

## 2018-04-22 DIAGNOSIS — M5136 Other intervertebral disc degeneration, lumbar region: Secondary | ICD-10-CM

## 2018-04-22 DIAGNOSIS — I4819 Other persistent atrial fibrillation: Secondary | ICD-10-CM

## 2018-04-22 DIAGNOSIS — K219 Gastro-esophageal reflux disease without esophagitis: Secondary | ICD-10-CM

## 2018-04-22 DIAGNOSIS — M51369 Other intervertebral disc degeneration, lumbar region without mention of lumbar back pain or lower extremity pain: Secondary | ICD-10-CM

## 2018-04-22 NOTE — Assessment & Plan Note (Signed)
Appreciate PMR care  

## 2018-04-22 NOTE — Assessment & Plan Note (Signed)
Chronic, stable. Continue current regimen of lasix and metoprolol.

## 2018-04-22 NOTE — Patient Instructions (Addendum)
Call to schedule mammogram at the breast center.  Bring me copy of your full advanced directives including health care power of attorney form if you have this at home.  You are doing well today. Continue current medicines. Return as needed or in 6 months for follow up visit.    Health Maintenance After Age 83 After age 69, you are at a higher risk for certain long-term diseases and infections as well as injuries from falls. Falls are a major cause of broken bones and head injuries in people who are older than age 103. Getting regular preventive care can help to keep you healthy and well. Preventive care includes getting regular testing and making lifestyle changes as recommended by your health care provider. Talk with your health care provider about:  Which screenings and tests you should have. A screening is a test that checks for a disease when you have no symptoms.  A diet and exercise plan that is right for you. What should I know about screenings and tests to prevent falls? Screening and testing are the best ways to find a health problem early. Early diagnosis and treatment give you the best chance of managing medical conditions that are common after age 81. Certain conditions and lifestyle choices may make you more likely to have a fall. Your health care provider may recommend:  Regular vision checks. Poor vision and conditions such as cataracts can make you more likely to have a fall. If you wear glasses, make sure to get your prescription updated if your vision changes.  Medicine review. Work with your health care provider to regularly review all of the medicines you are taking, including over-the-counter medicines. Ask your health care provider about any side effects that may make you more likely to have a fall. Tell your health care provider if any medicines that you take make you feel dizzy or sleepy.  Osteoporosis screening. Osteoporosis is a condition that causes the bones to get  weaker. This can make the bones weak and cause them to break more easily.  Blood pressure screening. Blood pressure changes and medicines to control blood pressure can make you feel dizzy.  Strength and balance checks. Your health care provider may recommend certain tests to check your strength and balance while standing, walking, or changing positions.  Foot health exam. Foot pain and numbness, as well as not wearing proper footwear, can make you more likely to have a fall.  Depression screening. You may be more likely to have a fall if you have a fear of falling, feel emotionally low, or feel unable to do activities that you used to do.  Alcohol use screening. Using too much alcohol can affect your balance and may make you more likely to have a fall. What actions can I take to lower my risk of falls? General instructions  Talk with your health care provider about your risks for falling. Tell your health care provider if: ? You fall. Be sure to tell your health care provider about all falls, even ones that seem minor. ? You feel dizzy, sleepy, or off-balance.  Take over-the-counter and prescription medicines only as told by your health care provider. These include any supplements.  Eat a healthy diet and maintain a healthy weight. A healthy diet includes low-fat dairy products, low-fat (lean) meats, and fiber from whole grains, beans, and lots of fruits and vegetables. Home safety  Remove any tripping hazards, such as rugs, cords, and clutter.  Install safety equipment such as  grab bars in bathrooms and safety rails on stairs.  Keep rooms and walkways well-lit. Activity   Follow a regular exercise program to stay fit. This will help you maintain your balance. Ask your health care provider what types of exercise are appropriate for you.  If you need a cane or walker, use it as recommended by your health care provider.  Wear supportive shoes that have nonskid soles. Lifestyle  Do  not drink alcohol if your health care provider tells you not to drink.  If you drink alcohol, limit how much you have: ? 0-1 drink a day for women. ? 0-2 drinks a day for men.  Be aware of how much alcohol is in your drink. In the [place] one drink equals one typical bottle of beer (12 oz), one-half glass of wine (5 oz), or one shot of hard liquor (1 oz).  Do not use any products that contain nicotine or tobacco, such as cigarettes and e-cigarettes. If you need help quitting, ask your health care provider. Summary  Having a healthy lifestyle and getting preventive care can help to protect your health and wellness after age 51.  Screening and testing are the best way to find a health problem early and help you avoid having a fall. Early diagnosis and treatment give you the best chance for managing medical conditions that are more common for people who are older than age 44.  Falls are a major cause of broken bones and head injuries in people who are older than age 22. Take precautions to prevent a fall at home.  Work with your health care provider to learn what changes you can make to improve your health and wellness and to prevent falls. This information is not intended to replace advice given to you by your health care provider. Make sure you discuss any questions you have with your health care provider. Document Released: 02/05/2017 Document Revised: 02/05/2017 Document Reviewed: 02/05/2017 Elsevier Interactive Patient Education  2019 [employer].

## 2018-04-22 NOTE — Assessment & Plan Note (Signed)
Preventative protocols reviewed and updated unless pt declined. Discussed healthy diet and lifestyle.  

## 2018-04-22 NOTE — Assessment & Plan Note (Signed)
H/o lumpectomy 2018 for benign complex sclerosing lesion R breast - overdue for f/u - will order mammogram.

## 2018-04-22 NOTE — Progress Notes (Signed)
Subjective:   [name] is a 85 y.o. female who presents for Medicare Annual (Subsequent) preventive examination.  Review of Systems:  N/A Cardiac Risk Factors include: advanced age (>19men, >69 women);hypertension;dyslipidemia     Objective:     Vitals: BP 126/90 (BP Location: Right Arm, Patient Position: Sitting, Cuff Size: Normal)   Pulse 60   Temp 97.8 F (36.6 C) (Oral)   Ht 5' 2.5" (1.588 m) Comment: shoes  Wt 133 lb 8 oz (60.6 kg)   SpO2 96%   BMI 24.03 kg/m   Body mass index is 24.03 kg/m.  Advanced Directives 01/16/2018 12/09/2017 11/05/2016 10/29/2016 05/28/2016 10/19/2015 10/09/2014  Does Patient Have a Medical Advance Directive? Yes Yes Yes Yes Yes Yes Yes  Type of Paramedic of [place];Living will [place];Living will Living will Living will Living will;Healthcare Power of [place];Living will [place];Living will  Does patient want to make changes to medical advance directive? No - Patient declined - No - Patient declined - - No - Patient declined -  Copy of [place] in Chart? - - - - Yes - No - copy requested    Tobacco Social History   Tobacco Use  Smoking Status Former Smoker  . Packs/day: 0.25  . Years: 1.00  . Pack years: 0.25  . Last attempt to quit: 04/08/1976  . Years since quitting: 42.0  Smokeless Tobacco Never Used  Tobacco Comment   pt smokes occasionally "socially"     Counseling given: No Comment: pt smokes occasionally "socially"   Clinical Intake:  Pre-visit preparation completed: Yes  Pain Score: 2      Nutritional Status: BMI of 19-24  Normal Nutritional Risks: None Diabetes: No  How often do you need to have someone help you when you read instructions, pamphlets, or other written materials from your doctor or pharmacy?: 1 - Never What is the last grade level you completed in school?: 12th grade  Interpreter  Needed?: No  Comments: pt lives alone Information entered by :: LPinson, LPN  Past Medical History:  Diagnosis Date  . Abdominal aortic atherosclerosis ([place]) 09/2015   by xray  . Anxiety   . BCC (basal cell carcinoma of skin) 05/2011   on nose  . Breast lesion    a. diagnosed with complex sclerosing lesion with calcifications of the right breast in 08/2016  . Chest pain    a. nuclear stress test 03/2012: normal  . DDD (degenerative disc disease), lumbosacral 12/06/1992  . Gallstones 09/2015   incidentally by xray  . GERD (gastroesophageal reflux disease) 05/1999  . HERPES ZOSTER 04/13/2007   Qualifier: Diagnosis of  By: [name], [name]   . History of shingles    ophthalmic, takes acyclovir daily  . Hyperlipidemia 12/2003  . Hypertension 1990  . Mitral regurgitation    a. echo 03/2012: EF 60-65%, no RWMA, mild to mod AI/MR, mod TR, PASP 37 mmHg  . Osteoarthritis 08/21/1989  . Osteoporosis with fracture 11/1997   with compression fracture T12  . Persistent atrial fibrillation 03/16/2012   a. CHADS2VASc => 5 (HTN, age x 2, vascular disease, sex category)-->Eliquis 5 BID;  b. Recurrent AF in 06/2016;  c. 01/2017 s/p DCCV;  d. 02/2017 recurrent AFib->Amio added->DCCV; e. 03/2017 recurrent AFib, s/p reload of amio and DCCV 04/02/2017  . Rheumatoid arthritis ([place])   . RLS (restless legs syndrome)    Past Surgical History:  Procedure  Laterality Date  . ABDOMINAL HYSTERECTOMY  Age 20 - 36   S/P TAH  . abdominal ultrasound  04/23/2004   Gallstones  . BREAST LUMPECTOMY WITH RADIOACTIVE SEED LOCALIZATION Right 10/2016   breast lumpectomy with radioactive seed localization and margin assessment for complex sclerosing lesion [facility])  . BREAST LUMPECTOMY WITH RADIOACTIVE SEED LOCALIZATION Right 11/05/2016   Procedure: RIGHT BREAST LUMPECTOMY WITH RADIOACTIVE SEED LOCALIZATION;  Surgeon: Fanny Skates, MD;  Location: [facility];  Service: General;  Laterality:  Right;  . CARDIOVERSION N/A 01/31/2017   Procedure: CARDIOVERSION;  Surgeon: Wellington Hampshire, MD;  Location: [facility];  Service: Cardiovascular;  Laterality: N/A;  . CARDIOVERSION N/A 03/03/2017   Procedure: CARDIOVERSION;  Surgeon: Wellington Hampshire, MD;  Location: [facility];  Service: Cardiovascular;  Laterality: N/A;  . CARDIOVERSION N/A 04/02/2017   Procedure: CARDIOVERSION;  Surgeon: Wellington Hampshire, MD;  Location: [facility];  Service: Cardiovascular;  Laterality: N/A;  . CARDIOVERSION N/A 01/16/2018   Procedure: CARDIOVERSION;  Surgeon: Wellington Hampshire, MD;  Location: [facility];  Service: Cardiovascular;  Laterality: N/A;  . CATARACT EXTRACTION  05/2010   left eye  . CERVICAL DISCECTOMY  1992   Fusion (Dr. Joya Salm)  . ESI Bilateral 01/2016   S1 transforaminal ESI x3  . ESOPHAGOGASTRODUODENOSCOPY  01/01/2002   with ulcer, bx. neg; + stricture gastric ulcer with hemorrhage  . ESOPHAGOGASTRODUODENOSCOPY  04/23/2004   Esophageal stricture -- dilated  . nuclear stress test  03/2012   no ischemia  . SKIN CANCER EXCISION  05/20/2011   BCC from nose  . US ECHOCARDIOGRAPHY  03/2012   in flutter, EF 60%, mild-mod aortic, mitral, tricuspid regurg, mildly dilated LA   Family History  Problem Relation Age of Onset  . Emphysema Mother   . Heart disease Father        MI  . Stroke Father        first at age 58.  . Stroke Sister   . Diabetes Sister   . Hypertension Brother   . Heart disease Brother        Heart Valve  . Stroke Brother   . Diabetes Brother   . Cancer Brother        esophageal  . Diabetes Brother   . Diabetes Brother   . Colon cancer Neg Hx    Social History   Socioeconomic History  . Marital status: Widowed    Spouse name: Not on file  . Number of children: 5  . Years of education: Not on file  . Highest education level: Not on file  Occupational History  . Occupation: Retired - Occupational psychologist: RETIRED  Social Needs  . Financial resource strain: Not  on file  . Food insecurity:    Worry: Not on file    Inability: Not on file  . Transportation needs:    Medical: Not on file    Non-medical: Not on file  Tobacco Use  . Smoking status: Former Smoker    Packs/day: 0.25    Years: 1.00    Pack years: 0.25    Last attempt to quit: 04/08/1976    Years since quitting: 42.0  . Smokeless tobacco: Never Used  . Tobacco comment: pt smokes occasionally "socially"  Substance and Sexual Activity  . Alcohol use: No    Alcohol/week: 1.0 standard drinks    Types: 1 Glasses of wine per week    Frequency: Never    Comment: occassional  .  Drug use: No  . Sexual activity: Never  Lifestyle  . Physical activity:    Days per week: Not on file    Minutes per session: Not on file  . Stress: Not on file  Relationships  . Social connections:    Talks on phone: Not on file    Gets together: Not on file    Attends religious service: Not on file    Active member of club or organization: Not on file    Attends meetings of clubs or organizations: Not on file    Relationship status: Not on file  Other Topics Concern  . Not on file  Social History Narrative   Lives with grandson who smokes, dog   From [place].  Widowed 01/1999; 1st marriage 25 years / 2nd marriage 13 years.   One son deceased 02-11-23.   Activity: active with dog   Diet: good water, fruits/vegetables daily    Outpatient Encounter Medications as of 04/22/2018  Medication Sig  . acetaminophen (TYLENOL) 500 MG tablet Take 1,000 mg every 8 (eight) hours as needed by mouth for moderate pain.   Marland Kitchen acyclovir (ZOVIRAX) 400 MG tablet Take 400 mg 2 (two) times daily by mouth.   Marland Kitchen amiodarone (PACERONE) 200 MG tablet TAKE ONE TABLET BY MOUTH EVERY DAY  . atorvastatin (LIPITOR) 20 MG tablet TAKE ONE TABLET EVERY DAY  . DULoxetine (CYMBALTA) 60 MG capsule TAKE 1 CAPSULE EVERY DAY  . ELIQUIS 2.5 MG TABS tablet TAKE 1 TABLET BY MOUTH TWICE DAILY  . furosemide (LASIX) 20 MG tablet Take 1 tablet (20  mg total) by mouth daily.  Marland Kitchen gabapentin (NEURONTIN) 100 MG capsule Take 2 capsules (200 mg total) by mouth 3 (three) times daily.  . pantoprazole (PROTONIX) 40 MG tablet TAKE 1 TABLET BY MOUTH DAILY  . prednisoLONE acetate (PRED FORTE) 1 % ophthalmic suspension Place 1 drop into the left eye daily.   Marland Kitchen rOPINIRole (REQUIP) 1 MG tablet Take 2 tablets (2 mg total) by mouth at bedtime.  Marland Kitchen tiZANidine (ZANAFLEX) 2 MG tablet Take 2 mg by mouth every evening.   . traMADol (ULTRAM) 50 MG tablet Take 1 tablet (50 mg total) by mouth 2 (two) times daily.  . Vitamin D, Ergocalciferol, (DRISDOL) 1.25 MG (50000 UT) CAPS capsule TAKE 1 CAPSULE BY MOUTH ONCE WEEKLY  . [DISCONTINUED] denosumab (PROLIA) 60 MG/ML SOSY injection Inject 60 mg into the skin once for 1 dose.  . metoprolol tartrate (LOPRESSOR) 25 MG tablet Take 1 tablet (25 mg total) by mouth 2 (two) times daily.   No facility-administered encounter medications on file as of 04/22/2018.     Activities of Daily Living In your present state of health, do you have any difficulty performing the following activities: 04/22/2018 01/16/2018  Hearing? Y N  Vision? N N  Difficulty concentrating or making decisions? Y N  Walking or climbing stairs? Y N  Dressing or bathing? N N  Doing errands, shopping? Y -  Conservation officer, nature and eating ? N -  Using the Toilet? N -  In the past six months, have you accidently leaked urine? N -  Do you have problems with loss of bowel control? N -  Managing your Medications? N -  Managing your Finances? N -  Housekeeping or managing your Housekeeping? N -  Some recent data might be hidden    Patient Care Team: Ria Bush, MD as PCP - General (Family Medicine) Wellington Hampshire, MD as PCP - Cardiology (Cardiology) Fletcher Anon,  Mertie Clause, MD as Consulting Physician (Cardiology) Leandrew Koyanagi, MD as Referring Physician (Ophthalmology) Sharlet Salina, MD as Referring Physician (Physical Medicine and  Rehabilitation)    Assessment:   This is a routine wellness examination for Marcell.   Hearing Screening   125Hz  250Hz  500Hz  1000Hz  2000Hz  3000Hz  4000Hz  6000Hz  8000Hz   Right ear:   40 0 40  0    Left ear:   40 40 40  0    Vision Screening Comments: Vision exam in 2019 with Dr. Wallace Going   Exercise Activities and Dietary recommendations Current Exercise Habits: The patient does not participate in regular exercise at present, Exercise limited by: None identified  Goals    . Increase physical activity     Starting 04/22/2018, I will continue to walk at least 10 min daily.        Fall Risk Fall Risk  04/22/2018 11/11/2016 05/28/2016 12/08/2014 04/14/2012  Falls in the past year? 1 No Yes Yes Yes  Comment - - pt reports multiple falls while gardening or doing other yard work - fell when pulling big rock  Number falls in past yr: 1 - 2 or more 1 1  Injury with Fall? 1 - No Yes No  Risk for fall due to : Impaired balance/gait;Impaired mobility;History of fall(s) - - - -   Depression Screen PHQ 2/9 Scores 04/22/2018 05/28/2016 12/08/2014 04/14/2012  PHQ - 2 Score 0 0 0 0  PHQ- 9 Score 0 - - -     Cognitive Function MMSE - Mini Mental State Exam 04/22/2018 05/28/2016  Orientation to time 5 5  Orientation to Place 5 5  Registration 3 3  Attention/ Calculation 0 0  Recall 3 3  Language- name 2 objects 0 0  Language- repeat 1 1  Language- follow 3 step command 3 3  Language- read & follow direction 0 0  Write a sentence 0 0  Copy design 0 0  Total score 20 20       PLEASE NOTE: A Mini-Cog screen was completed. Maximum score is 20. A value of 0 denotes this part of [place] MMSE was not completed or the patient failed this part of the Mini-Cog screening.   Mini-Cog Screening Orientation to Time - Max 5 pts Orientation to Place - Max 5 pts Registration - Max 3 pts Recall - Max 3 pts Language Repeat - Max 1 pts Language Follow 3 Step Command - Max 3 pts   Immunization History    Administered Date(s) Administered  . Influenza Split 03/20/2011  . Influenza,inj,Quad PF,6+ Mos 12/08/2014, 12/04/2015, 01/07/2017, 01/05/2018  . Influenza-Unspecified 03/03/2013  . Pneumococcal Conjugate-13 01/18/2015  . Pneumococcal Polysaccharide-23 12/08/2003  . Td 12/08/2003    Screening Tests Health Maintenance  Topic Date Due  . DTaP/Tdap/Td (1 - Tdap) 12/07/2023 (Originally 06/04/1940)  . TETANUS/TDAP  12/07/2023 (Originally 12/07/2013)  . INFLUENZA VACCINE  Completed  . DEXA SCAN  Completed  . PNA vac Low Risk Adult  Completed      Plan:     I have personally reviewed, addressed, and noted the following in the patient's chart:  A. Medical and social history B. Use of alcohol, tobacco or illicit drugs  C. Current medications and supplements D. Functional ability and status E.  Nutritional status F.  Physical activity G. Advance directives H. List of other physicians I.  Hospitalizations, surgeries, and ER visits in previous 12 months J.  [place] to include hearing, vision, cognitive, depression L. Referrals and appointments -  none  In addition, I have reviewed and discussed with patient certain preventive protocols, quality metrics, and best practice recommendations. A written personalized care plan for preventive services as well as general preventive health recommendations were provided to patient.  See attached scanned questionnaire for additional information.   Signed,   Lindell Noe, MHA, BS, LPN Health Coach

## 2018-04-22 NOTE — Assessment & Plan Note (Signed)
Continue amiodarone, eliquis, metoprolol. She has had recurrent cardioversions.

## 2018-04-22 NOTE — Assessment & Plan Note (Signed)
Chronic, stable on current regimen  The ASCVD Risk score Crystal Bussing DC Jr., Crystal al., [name]) failed to calculate for the following reasons:   The [name] ASCVD risk score is only valid for ages 50 to 64

## 2018-04-22 NOTE — Assessment & Plan Note (Signed)
Continue statin. 

## 2018-04-22 NOTE — Assessment & Plan Note (Signed)
Continue protonix in setting of h/o gastric ulcer and current eliquis use.

## 2018-04-22 NOTE — Assessment & Plan Note (Signed)
Stable period on prolia, she feels well. Continue this. H/o compression fractures.

## 2018-04-22 NOTE — Progress Notes (Signed)
PCP notes:   Health maintenance:  No gaps identified  Abnormal screenings:   Hearing - failed  Hearing Screening   125Hz  250Hz  500Hz  1000Hz  2000Hz  3000Hz  4000Hz  6000Hz  8000Hz   Right ear:   40 0 40  0    Left ear:   40 40 40  0     Fall risk - hx of multiple falls (4 falls since August 2019) Fall Risk  04/22/2018 11/11/2016 05/28/2016 12/08/2014 04/14/2012  Falls in the past year? 1 No Yes Yes Yes  Comment - - pt reports multiple falls while gardening or doing other yard work - fell when pulling big rock  Number falls in past yr: 1 - 2 or more 1 1  Injury with Fall? 1 - No Yes No  Risk for fall due to : Impaired balance/gait;Impaired mobility;History of fall(s) - - - -   Patient concerns:   None  Nurse concerns:  None  Next PCP appt:   04/22/18 @ 1230

## 2018-04-22 NOTE — Patient Instructions (Signed)
[name] , Thank you for taking time to come for your Medicare Wellness Visit. I appreciate your ongoing commitment to your health goals. Please review the following plan we discussed and let me know if I can assist you in the future.   These are the goals we discussed: Goals    . Increase physical activity     Starting 04/22/2018, I will continue to walk at least 10 min daily.        This is a list of the screening recommended for you and due dates:  Health Maintenance  Topic Date Due  . DTaP/Tdap/Td vaccine (1 - Tdap) 12/07/2023*  . Tetanus Vaccine  12/07/2023*  . Flu Shot  Completed  . DEXA scan (bone density measurement)  Completed  . Pneumonia vaccines  Completed  *Topic was postponed. The date shown is not the original due date.   Preventive Care for Adults  A healthy lifestyle and preventive care can promote health and wellness. Preventive health guidelines for adults include the following key practices.  . A routine yearly physical is a good way to check with your health care provider about your health and preventive screening. It is a chance to share any concerns and updates on your health and to receive a thorough exam.  . Visit your dentist for a routine exam and preventive care every 6 months. Brush your teeth twice a day and floss once a day. Good oral hygiene prevents tooth decay and gum disease.  . The frequency of eye exams is based on your age, health, family medical history, use  of contact lenses, and other factors. Follow your health care provider's recommendations for frequency of eye exams.  . Eat a healthy diet. Foods like vegetables, fruits, whole grains, low-fat dairy products, and lean protein foods contain the nutrients you need without too many calories. Decrease your intake of foods high in solid fats, added sugars, and salt. Eat the right amount of calories for you. Get information about a proper diet from your health care provider, if necessary.  .  Regular physical exercise is one of the most important things you can do for your health. Most adults should get at least 150 minutes of moderate-intensity exercise (any activity that increases your heart rate and causes you to sweat) each week. In addition, most adults need muscle-strengthening exercises on 2 or more days a week.  Silver Sneakers may be a benefit available to you. To determine eligibility, you may visit the website: www.silversneakers.com or contact program at 234-223-3900 Mon-Fri between 8AM-8PM.   . Maintain a healthy weight. The body mass index (BMI) is a screening tool to identify possible weight problems. It provides an estimate of body fat based on height and weight. Your health care provider can find your BMI and can help you achieve or maintain a healthy weight.   For adults 20 years and older: ? A BMI below 18.5 is considered underweight. ? A BMI of 18.5 to 24.9 is normal. ? A BMI of 25 to 29.9 is considered overweight. ? A BMI of 30 and above is considered obese.   . Maintain normal blood lipids and cholesterol levels by exercising and minimizing your intake of saturated fat. Eat a balanced diet with plenty of fruit and vegetables. Blood tests for lipids and cholesterol should begin at age 91 and be repeated every 5 years. If your lipid or cholesterol levels are high, you are over 50, or you are at high risk for heart disease,  you may need your cholesterol levels checked more frequently. Ongoing high lipid and cholesterol levels should be treated with medicines if diet and exercise are not working.  . If you smoke, find out from your health care provider how to quit. If you do not use tobacco, please do not start.  . If you choose to drink alcohol, please do not consume more than 2 drinks per day. One drink is considered to be 12 ounces (355 mL) of beer, 5 ounces (148 mL) of wine, or 1.5 ounces (44 mL) of liquor.  . If you are 67-10 years old, ask your health care  provider if you should take aspirin to prevent strokes.  . Use sunscreen. Apply sunscreen liberally and repeatedly throughout the day. You should seek shade when your shadow is shorter than you. Protect yourself by wearing long sleeves, pants, a wide-brimmed hat, and sunglasses year round, whenever you are outdoors.  . Once a month, do a whole body skin exam, using a mirror to look at the skin on your back. Tell your health care provider of new moles, moles that have irregular borders, moles that are larger than a pencil eraser, or moles that have changed in shape or color.

## 2018-04-22 NOTE — Assessment & Plan Note (Signed)
Stable period on cymbalta

## 2018-04-22 NOTE — Assessment & Plan Note (Signed)
Advanced planning - Living will scanned 04/2017. No HCPOA form. Asked to bring me copy - son Dominica Severin is HCPOA.Does not want extended life support.

## 2018-04-23 ENCOUNTER — Ambulatory Visit: Payer: Medicare Other | Admitting: Physical Therapy

## 2018-04-28 ENCOUNTER — Ambulatory Visit: Payer: Medicare Other | Admitting: Physical Therapy

## 2018-04-30 ENCOUNTER — Ambulatory Visit: Payer: Medicare Other | Admitting: Physical Therapy

## 2018-05-05 ENCOUNTER — Ambulatory Visit: Payer: Medicare Other | Admitting: Physical Therapy

## 2018-05-11 ENCOUNTER — Other Ambulatory Visit: Payer: Self-pay | Admitting: Family Medicine

## 2018-05-26 ENCOUNTER — Ambulatory Visit: Payer: Medicare Other | Admitting: Cardiovascular Disease

## 2018-05-26 ENCOUNTER — Encounter: Payer: Self-pay | Admitting: Cardiovascular Disease

## 2018-05-26 VITALS — BP 128/82 | HR 57 | Ht 62.0 in | Wt 138.0 lb

## 2018-05-26 DIAGNOSIS — I484 Atypical atrial flutter: Secondary | ICD-10-CM

## 2018-05-26 DIAGNOSIS — I5032 Chronic diastolic (congestive) heart failure: Secondary | ICD-10-CM

## 2018-05-26 DIAGNOSIS — I4819 Other persistent atrial fibrillation: Secondary | ICD-10-CM | POA: Diagnosis not present

## 2018-05-26 DIAGNOSIS — E785 Hyperlipidemia, unspecified: Secondary | ICD-10-CM | POA: Diagnosis not present

## 2018-05-26 DIAGNOSIS — I10 Essential (primary) hypertension: Secondary | ICD-10-CM

## 2018-05-26 MED ORDER — METOPROLOL TARTRATE 25 MG PO TABS: 12.5000 mg | ORAL_TABLET | Freq: Two times a day (BID) | ORAL | 3 refills | Status: DC

## 2018-05-26 NOTE — Patient Instructions (Signed)
Medication Instructions:  DECREASE the Metoprolol to 12.5 mg twice daily (half a tablet)  If you need a refill on your cardiac medications before your next appointment, please call your pharmacy.   Lab work: Your provider would like for you to have the following labs today: BMET  If you have labs (blood work) drawn today and your tests are completely normal, you will receive your results only by: Marland Kitchen MyChart Message (if you have MyChart) OR . A paper copy in the mail If you have any lab test that is abnormal or we need to change your treatment, we will call you to review the results.  Testing/Procedures: None ordered  Follow-Up: At [facility], you and your health needs are our priority.  As part of our continuing mission to provide you with exceptional heart care, we have created designated Provider Care Teams.  These Care Teams include your primary Cardiologist (physician) and Advanced Practice Providers (APPs -  Physician Assistants and Nurse Practitioners) who all work together to provide you with the care you need, when you need it. You will need a follow up appointment in 4 months.  Please call our office 2 months in advance to schedule this appointment.  You may see [name], MD or one of the following Advanced Practice Providers on your designated Care Team:   Murray Hodgkins, NP Christell Faith, PA-C . Marrianne Mood, PA-C

## 2018-05-26 NOTE — Progress Notes (Signed)
Cardiology Office Note   Date:  05/26/2018   ID:  [name], DOB 01/24/1930, MRN [id number]  PCP:  Ria Bush, MD  Cardiologist:   [name], MD   Chief Complaint  Patient presents with  . other    3 mo f/u.Fatigue denies CP SOB.Medications reviewed verbally with the patient.       History of Present Illness: [name] is a 85 y.o. female who presents for a follow-up visit regarding persistent atrial fibrillation   and atrial flutter. Nuclear stress test in 03/2012 showed no evidence of ischemia.   Most recent echocardiogram in December 2019 showed normal LV systolic function, mild aortic and mitral regurgitation, moderately dilated left atrium and moderate pulmonary hypertension with systolic pressure of 63 mmHg. She was noted to have atrial flutter in October 2019 and was scheduled for cardioversion but was noted to be back in sinus rhythm on the day of procedure. The patient was most recently started on small dose furosemide 20 mg once daily given leg edema and echocardiogram finding of pulmonary hypertension.  She has been doing reasonably well overall but continues to complain of persistent fatigue.  No palpitations or dizziness.  Past Medical History:  Diagnosis Date  . Abdominal aortic atherosclerosis ([place]) 09/2015   by xray  . Anxiety   . BCC (basal cell carcinoma of skin) 05/2011   on nose  . Breast lesion    a. diagnosed with complex sclerosing lesion with calcifications of the right breast in 08/2016  . Chest pain    a. nuclear stress test 03/2012: normal  . DDD (degenerative disc disease), lumbosacral 12/06/1992  . Gallstones 09/2015   incidentally by xray  . GERD (gastroesophageal reflux disease) 05/1999  . HERPES ZOSTER 04/13/2007   Qualifier: Diagnosis of  By: [name], [name]   . History of shingles    ophthalmic, takes acyclovir daily  . Hyperlipidemia 12/2003  . Hypertension 1990  . Mitral regurgitation    a. echo  03/2012: EF 60-65%, no RWMA, mild to mod AI/MR, mod TR, PASP 37 mmHg  . Osteoarthritis 08/21/1989  . Osteoporosis with fracture 11/1997   with compression fracture T12  . Persistent atrial fibrillation 03/16/2012   a. CHADS2VASc => 5 (HTN, age x 2, vascular disease, sex category)-->Eliquis 5 BID;  b. Recurrent AF in 06/2016;  c. 01/2017 s/p DCCV;  d. 02/2017 recurrent AFib->Amio added->DCCV; e. 03/2017 recurrent AFib, s/p reload of amio and DCCV 04/02/2017  . Rheumatoid arthritis ([place])   . RLS (restless legs syndrome)     Past Surgical History:  Procedure Laterality Date  . ABDOMINAL HYSTERECTOMY  Age 66 - 48   S/P TAH  . abdominal ultrasound  04/23/2004   Gallstones  . BREAST LUMPECTOMY WITH RADIOACTIVE SEED LOCALIZATION Right 10/2016   breast lumpectomy with radioactive seed localization and margin assessment for complex sclerosing lesion [facility])  . BREAST LUMPECTOMY WITH RADIOACTIVE SEED LOCALIZATION Right 11/05/2016   Procedure: RIGHT BREAST LUMPECTOMY WITH RADIOACTIVE SEED LOCALIZATION;  Surgeon: Fanny Skates, MD;  Location: [place];  Service: General;  Laterality: Right;  . CARDIOVERSION N/A 01/31/2017   Procedure: CARDIOVERSION;  Surgeon: Wellington Hampshire, MD;  Location: [facility];  Service: Cardiovascular;  Laterality: N/A;  . CARDIOVERSION N/A 03/03/2017   Procedure: CARDIOVERSION;  Surgeon: Wellington Hampshire, MD;  Location: [facility];  Service: Cardiovascular;  Laterality: N/A;  . CARDIOVERSION N/A 04/02/2017   Procedure: CARDIOVERSION;  Surgeon: Wellington Hampshire, MD;  Location: [facility];  Service: Cardiovascular;  Laterality: N/A;  . CARDIOVERSION N/A 01/16/2018   Procedure: CARDIOVERSION;  Surgeon: Wellington Hampshire, MD;  Location: [facility];  Service: Cardiovascular;  Laterality: N/A;  . CATARACT EXTRACTION  05/2010   left eye  . CERVICAL DISCECTOMY  1992   Fusion (Dr. Joya Salm)  . ESI Bilateral 01/2016   S1 transforaminal ESI x3  .  ESOPHAGOGASTRODUODENOSCOPY  01/01/2002   with ulcer, bx. neg; + stricture gastric ulcer with hemorrhage  . ESOPHAGOGASTRODUODENOSCOPY  04/23/2004   Esophageal stricture -- dilated  . nuclear stress test  03/2012   no ischemia  . SKIN CANCER EXCISION  05/20/2011   BCC from nose  . US ECHOCARDIOGRAPHY  03/2012   in flutter, EF 60%, mild-mod aortic, mitral, tricuspid regurg, mildly dilated LA     Current Outpatient Medications  Medication Sig Dispense Refill  . acetaminophen (TYLENOL) 500 MG tablet Take 1,000 mg every 8 (eight) hours as needed by mouth for moderate pain.     Marland Kitchen acyclovir (ZOVIRAX) 400 MG tablet Take 400 mg 2 (two) times daily by mouth.     Marland Kitchen amiodarone (PACERONE) 200 MG tablet TAKE ONE TABLET BY MOUTH EVERY DAY 90 tablet 3  . atorvastatin (LIPITOR) 20 MG tablet TAKE ONE TABLET EVERY DAY 90 tablet 0  . DULoxetine (CYMBALTA) 60 MG capsule TAKE 1 CAPSULE EVERY DAY 90 capsule 0  . ELIQUIS 2.5 MG TABS tablet TAKE 1 TABLET BY MOUTH TWICE DAILY 180 tablet 1  . furosemide (LASIX) 20 MG tablet Take 1 tablet (20 mg total) by mouth daily. 90 tablet 3  . gabapentin (NEURONTIN) 100 MG capsule Take 2 capsules (200 mg total) by mouth 3 (three) times daily. 180 capsule 3  . pantoprazole (PROTONIX) 40 MG tablet TAKE 1 TABLET BY MOUTH DAILY 90 tablet 0  . prednisoLONE acetate (PRED FORTE) 1 % ophthalmic suspension Place 1 drop into the left eye daily.     Marland Kitchen rOPINIRole (REQUIP) 1 MG tablet TAKE TWO TABLETS BY MOUTH AT BEDTIME 180 tablet 3  . tiZANidine (ZANAFLEX) 2 MG tablet Take 2 mg by mouth every evening.     . traMADol (ULTRAM) 50 MG tablet Take 1 tablet (50 mg total) by mouth 2 (two) times daily.  0  . Vitamin D, Ergocalciferol, (DRISDOL) 1.25 MG (50000 UT) CAPS capsule TAKE 1 CAPSULE BY MOUTH ONCE WEEKLY 12 capsule 0  . metoprolol tartrate (LOPRESSOR) 25 MG tablet Take 0.5 tablets (12.5 mg total) by mouth 2 (two) times daily. 180 tablet 3   No current facility-administered medications  for this visit.     Allergies:   Penicillins    Social History:  The patient  reports that she quit smoking about 42 years ago. She has a 0.25 pack-year smoking history. She has never used smokeless tobacco. She reports that she does not drink alcohol or use drugs.   Family History:  The patient's family history includes Cancer in her brother; Diabetes in her brother, brother, brother, and sister; Emphysema in her mother; Heart disease in her brother and father; Hypertension in her brother; Stroke in her brother, father, and sister.    ROS:  Please see the history of present illness.   Otherwise, review of systems are positive for none.   All other systems are reviewed and negative.    PHYSICAL EXAM: VS:  BP 128/82 (BP Location: Left Arm, Patient Position: Sitting, Cuff Size: Normal)   Pulse (!) 57   Ht 5\' 2"  (1.575  m)   Wt 138 lb (62.6 kg)   BMI 25.24 kg/m  , BMI Body mass index is 25.24 kg/m. GEN: Well nourished, well developed, in no acute distress  HEENT: normal  Neck: no JVD, carotid bruits, or masses Cardiac: Regular but tachycardic,no murmurs, rubs, or gallops, mild bilateral leg edema worse on the left side with chronic stasis dermatitis. Respiratory:  clear to auscultation bilaterally, normal work of breathing GI: soft, nontender, nondistended, + BS MS: no deformity or atrophy  Skin: warm and dry, no rash Neuro:  Strength and sensation are intact Psych: euthymic mood, full affect  EKG:  EKG is ordered today. The ekg ordered today demonstrates sinus bradycardia with a heart rate of 59 bpm.  Minimal LVH.   Recent Labs: 01/07/2018: ALT 22; TSH 2.670 01/30/2018: BUN 15; Creat 0.81; Hemoglobin 12.6; Platelets 241; Potassium 4.0; Sodium 144    Lipid Panel    Component Value Date/Time   CHOL 135 02/11/2018 1423   TRIG 113.0 02/11/2018 1423   HDL 59.20 02/11/2018 1423   CHOLHDL 2 02/11/2018 1423   VLDL 22.6 02/11/2018 1423   LDLCALC 53 02/11/2018 1423   LDLDIRECT  173.1 02/19/2010 0908      Wt Readings from Last 3 Encounters:  05/26/18 138 lb (62.6 kg)  04/22/18 133 lb 8 oz (60.6 kg)  04/22/18 133 lb 8 oz (60.6 kg)       No flowsheet data found.    ASSESSMENT AND PLAN:  1.  Paroxysmal atrial flutter and persistent atrial fibrillation: Maintaining in sinus rhythm with amiodarone 200 mg once daily and metoprolol 25 mg twice daily.  Continue anticoagulation with Eliquis 2.5 mg twice daily.  She continues to be mildly bradycardic with fatigue.  I elected to decrease metoprolol to 12.5 mg twice daily.   2. Essential hypertension: Blood pressure is reasonably controlled.  3. Hyperlipidemia: Currently on atorvastatin 20 mg once daily with most recent LDL of 75.  4.  Chronic diastolic heart failure: The patient had worsening leg edema recently and echocardiogram showed at least moderate pulmonary hypertension.  She was started on furosemide 20 mg once daily since then.  I requested basic metabolic profile to be done today.    Disposition:   FU with me in 4 months  Signed,  [name], MD  05/26/2018 3:05 PM    [facility]

## 2018-05-27 LAB — BASIC METABOLIC PANEL
BUN / CREAT RATIO: 14 (ref 12–28)
BUN: 13 mg/dL (ref 8–27)
CO2: 26 mmol/L (ref 20–29)
Calcium: 9.1 mg/dL (ref 8.7–10.3)
Chloride: 99 mmol/L (ref 96–106)
Creatinine, Ser: 0.94 mg/dL (ref 0.57–1.00)
GFR calc Af Amer: 64 mL/min/{1.73_m2} (ref >59)
GFR calc non Af Amer: 55 mL/min/{1.73_m2} — ABNORMAL LOW (ref >59)
Glucose: 89 mg/dL (ref 65–99)
Potassium: 3.6 mmol/L (ref 3.5–5.2)
Sodium: 141 mmol/L (ref 134–144)

## 2018-05-29 DIAGNOSIS — M5416 Radiculopathy, lumbar region: Secondary | ICD-10-CM | POA: Diagnosis not present

## 2018-05-29 DIAGNOSIS — M62830 Muscle spasm of back: Secondary | ICD-10-CM | POA: Diagnosis not present

## 2018-05-29 DIAGNOSIS — M48062 Spinal stenosis, lumbar region with neurogenic claudication: Secondary | ICD-10-CM | POA: Diagnosis not present

## 2018-05-29 DIAGNOSIS — M5136 Other intervertebral disc degeneration, lumbar region: Secondary | ICD-10-CM | POA: Diagnosis not present

## 2018-06-11 ENCOUNTER — Ambulatory Visit: Payer: Medicare Other | Admitting: Family Medicine

## 2018-06-11 ENCOUNTER — Telehealth: Payer: Self-pay

## 2018-06-11 NOTE — Telephone Encounter (Signed)
plz call pt for an update. What's going on with ear?

## 2018-06-11 NOTE — Telephone Encounter (Signed)
[place] Night - Client Nonclinical Telephone Record [place] Night - Client Client Site [place] Physician Ria Bush - MD Contact Type Call Who Is Calling Patient / Member / Family / Caregiver Caller Name Ellaina Schuler Caller Phone Number [phone] Patient Name [name] Patient DOB [birth year] Call Type Message Only Information Provided Reason for Call Request to [facility] Appointment Initial Comment Caller states she has an appt. 8:30am, has problems with her ear, saw something that looked black, she needs to cancel. Additional Comment Call Closed By: Mable Paris Transaction Date/Time: 06/10/2018 7:51:56 PM (ET)

## 2018-06-11 NOTE — Progress Notes (Deleted)
There were no vitals taken for this visit.   CC: *** Subjective:    Patient ID: [name], female    DOB: 1930/02/11, 85 y.o.   MRN: [id number]  HPI: [name] is a 85 y.o. female presenting on 06/11/2018 for No chief complaint on file.    ***      Relevant past medical, surgical, family and social history reviewed and updated as indicated. Interim medical history since our last visit reviewed. Allergies and medications reviewed and updated. Outpatient Medications Prior to Visit  Medication Sig Dispense Refill  . acetaminophen (TYLENOL) 500 MG tablet Take 1,000 mg every 8 (eight) hours as needed by mouth for moderate pain.     Marland Kitchen acyclovir (ZOVIRAX) 400 MG tablet Take 400 mg 2 (two) times daily by mouth.     Marland Kitchen amiodarone (PACERONE) 200 MG tablet TAKE ONE TABLET BY MOUTH EVERY DAY 90 tablet 3  . atorvastatin (LIPITOR) 20 MG tablet TAKE ONE TABLET EVERY DAY 90 tablet 0  . DULoxetine (CYMBALTA) 60 MG capsule TAKE 1 CAPSULE EVERY DAY 90 capsule 0  . ELIQUIS 2.5 MG TABS tablet TAKE 1 TABLET BY MOUTH TWICE DAILY 180 tablet 1  . furosemide (LASIX) 20 MG tablet Take 1 tablet (20 mg total) by mouth daily. 90 tablet 3  . gabapentin (NEURONTIN) 100 MG capsule Take 2 capsules (200 mg total) by mouth 3 (three) times daily. 180 capsule 3  . metoprolol tartrate (LOPRESSOR) 25 MG tablet Take 0.5 tablets (12.5 mg total) by mouth 2 (two) times daily. 180 tablet 3  . pantoprazole (PROTONIX) 40 MG tablet TAKE 1 TABLET BY MOUTH DAILY 90 tablet 0  . prednisoLONE acetate (PRED FORTE) 1 % ophthalmic suspension Place 1 drop into the left eye daily.     Marland Kitchen rOPINIRole (REQUIP) 1 MG tablet TAKE TWO TABLETS BY MOUTH AT BEDTIME 180 tablet 3  . tiZANidine (ZANAFLEX) 2 MG tablet Take 2 mg by mouth every evening.     . traMADol (ULTRAM) 50 MG tablet Take 1 tablet (50 mg total) by mouth 2 (two) times daily.  0  . Vitamin D, Ergocalciferol, (DRISDOL) 1.25 MG (50000 UT) CAPS capsule TAKE 1 CAPSULE BY MOUTH  ONCE WEEKLY 12 capsule 0   No facility-administered medications prior to visit.      Per HPI unless specifically indicated in ROS section below Review of Systems Objective:    There were no vitals taken for this visit.  Wt Readings from Last 3 Encounters:  05/26/18 138 lb (62.6 kg)  04/22/18 133 lb 8 oz (60.6 kg)  04/22/18 133 lb 8 oz (60.6 kg)    Physical Exam    Results for orders placed or performed in visit on 63/33/54  Basic metabolic panel  Result Value Ref Range   Glucose 89 65 - 99 mg/dL   BUN 13 8 - 27 mg/dL   Creatinine, Ser 0.94 0.57 - 1.00 mg/dL   GFR calc non Af Amer 55 (L) >59 mL/min/1.73   GFR calc Af Amer 64 >59 mL/min/1.73   BUN/Creatinine Ratio 14 12 - 28   Sodium 141 134 - 144 mmol/L   Potassium 3.6 3.5 - 5.2 mmol/L   Chloride 99 96 - 106 mmol/L   CO2 26 20 - 29 mmol/L   Calcium 9.1 8.7 - 10.3 mg/dL   Assessment & Plan:   Problem List Items Addressed This Visit    None       No orders of the defined types  were placed in this encounter.  No orders of the defined types were placed in this encounter.   Follow up plan: No follow-ups on file.  Ria Bush, MD

## 2018-06-11 NOTE — Telephone Encounter (Signed)
Noted.  Fyi to Dr. G.  

## 2018-06-11 NOTE — Telephone Encounter (Signed)
Left message on vm per dpr asking pt to call back with an update on her condition. Also, to let Dr. Darnell Level know what is going on with her ear.

## 2018-06-11 NOTE — Telephone Encounter (Signed)
appt already cancelled. 

## 2018-06-12 NOTE — Telephone Encounter (Signed)
Spoke with pt's daughter, [name] (on dpr), asking how pt was doing and about her ears. States pt felt like a bug went into her ear. Denies any pain. But she canceled the appt because she cleaned her ears while in the shower and they were better. [name] says she/pt will r/s if pt acknowledges any more issues. Expresses her thanks for calling to check.

## 2018-06-27 NOTE — Progress Notes (Signed)
I reviewed health advisor's note, was available for consultation, and agree with documentation and plan.  

## 2018-07-03 ENCOUNTER — Emergency Department
Admission: EM | Admit: 2018-07-03 | Discharge: 2018-07-03 | Disposition: A | Discharge status: Home / Self Care | Payer: Medicare Other | Attending: Emergency Medicine | Admitting: Emergency Medicine

## 2018-07-03 ENCOUNTER — Emergency Department: Payer: Medicare Other

## 2018-07-03 ENCOUNTER — Other Ambulatory Visit: Payer: Self-pay

## 2018-07-03 ENCOUNTER — Encounter: Payer: Self-pay | Admitting: Emergency Medicine

## 2018-07-03 ENCOUNTER — Telehealth: Payer: Self-pay

## 2018-07-03 DIAGNOSIS — W19XXXA Unspecified fall, initial encounter: Secondary | ICD-10-CM

## 2018-07-03 DIAGNOSIS — Y9389 Activity, other specified: Secondary | ICD-10-CM | POA: Insufficient documentation

## 2018-07-03 DIAGNOSIS — Z79899 Other long term (current) drug therapy: Secondary | ICD-10-CM | POA: Diagnosis not present

## 2018-07-03 DIAGNOSIS — S0083XA Contusion of other part of head, initial encounter: Secondary | ICD-10-CM

## 2018-07-03 DIAGNOSIS — W228XXA Striking against or struck by other objects, initial encounter: Secondary | ICD-10-CM | POA: Insufficient documentation

## 2018-07-03 DIAGNOSIS — Z7901 Long term (current) use of anticoagulants: Secondary | ICD-10-CM | POA: Diagnosis not present

## 2018-07-03 DIAGNOSIS — S0511XA Contusion of eyeball and orbital tissues, right eye, initial encounter: Secondary | ICD-10-CM | POA: Insufficient documentation

## 2018-07-03 DIAGNOSIS — Y999 Unspecified external cause status: Secondary | ICD-10-CM | POA: Insufficient documentation

## 2018-07-03 DIAGNOSIS — Z87891 Personal history of nicotine dependence: Secondary | ICD-10-CM | POA: Diagnosis not present

## 2018-07-03 DIAGNOSIS — H02843 Edema of right eye, unspecified eyelid: Secondary | ICD-10-CM | POA: Diagnosis not present

## 2018-07-03 DIAGNOSIS — S0990XA Unspecified injury of head, initial encounter: Secondary | ICD-10-CM

## 2018-07-03 DIAGNOSIS — I10 Essential (primary) hypertension: Secondary | ICD-10-CM | POA: Insufficient documentation

## 2018-07-03 DIAGNOSIS — S0093XA Contusion of unspecified part of head, initial encounter: Secondary | ICD-10-CM | POA: Diagnosis not present

## 2018-07-03 DIAGNOSIS — Y929 Unspecified place or not applicable: Secondary | ICD-10-CM | POA: Insufficient documentation

## 2018-07-03 MED ORDER — OXYCODONE HCL 5 MG PO TABS: 2.5000 mg | ORAL_TABLET | Freq: Three times a day (TID) | ORAL | 0 refills | Status: DC | PRN

## 2018-07-03 MED ORDER — ACETAMINOPHEN 500 MG PO TABS
1000.0000 mg | ORAL_TABLET | Freq: Once | ORAL | Status: AC
  Administered 2018-07-03: 1000 mg via ORAL
  Filled 2018-07-03: qty 2

## 2018-07-03 NOTE — Telephone Encounter (Signed)
Noted. Agree with ER eval
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
## 2018-07-03 NOTE — ED Provider Notes (Addendum)
[facility] Emergency Department Provider Note  ____________________________________________  Time seen: Approximately 11:41 AM  I have reviewed the triage vital signs and the nursing notes.   HISTORY  Chief Complaint Fall   HPI [name] is a 85 y.o. female with history as listed below including atrial fibrillation on Eliquis who presents for evaluation of head trauma.  Patient reports that she was going into the house after picking up her newspaper when the door started to close.  She tried to get in before the door closed on her but the door ended up hitting her in her face.  She is complaining of sharp moderate constant pain in the right side of her face.  She denies LOC, she did not fall and hit the back of her head.  She is on Eliquis.  She denies dizziness.  She is complaining of right-sided neck pain but no midline neck pain, no back pain, no extremity pain, no chest pain, shortness of breath, or abdominal pain.  Past Medical History:  Diagnosis Date  . Abdominal aortic atherosclerosis ([place]) 09/2015   by xray  . Anxiety   . BCC (basal cell carcinoma of skin) 05/2011   on nose  . Breast lesion    a. diagnosed with complex sclerosing lesion with calcifications of the right breast in 08/2016  . Chest pain    a. nuclear stress test 03/2012: normal  . DDD (degenerative disc disease), lumbosacral 12/06/1992  . Gallstones 09/2015   incidentally by xray  . GERD (gastroesophageal reflux disease) 05/1999  . HERPES ZOSTER 04/13/2007   Qualifier: Diagnosis of  By: [name], [name]   . History of shingles    ophthalmic, takes acyclovir daily  . Hyperlipidemia 12/2003  . Hypertension 1990  . Mitral regurgitation    a. echo 03/2012: EF 60-65%, no RWMA, mild to mod AI/MR, mod TR, PASP 37 mmHg  . Osteoarthritis 08/21/1989  . Osteoporosis with fracture 11/1997   with compression fracture T12  . Persistent atrial fibrillation 03/16/2012   a.  CHADS2VASc => 5 (HTN, age x 2, vascular disease, sex category)-->Eliquis 5 BID;  b. Recurrent AF in 06/2016;  c. 01/2017 s/p DCCV;  d. 02/2017 recurrent AFib->Amio added->DCCV; e. 03/2017 recurrent AFib, s/p reload of amio and DCCV 04/02/2017  . Rheumatoid arthritis ([place])   . RLS (restless legs syndrome)     Patient Active Problem List   Diagnosis Date Noted  . Vitamin D deficiency 02/25/2018  . Peripheral neuropathy 12/04/2017  . General unsteadiness 10/01/2017  . Constipation 10/01/2017  . Esophageal dysphagia 04/08/2017  . Chronic radicular pain of lower back 04/08/2017  . Fall 03/04/2017  . Encounter for chronic pain management 11/11/2016  . Breast mass, right 09/29/2016  . Abdominal aortic atherosclerosis ([place]) 09/07/2015  . Insomnia 06/08/2015  . Health maintenance examination 12/08/2014  . Advanced care planning/counseling discussion 12/08/2014  . Chronic leg pain 10/13/2014  . Fatigue 10/28/2013  . Persistent atrial fibrillation 04/24/2013  . Medicare annual wellness visit, subsequent 04/14/2012  . Atypical chest pain 03/16/2012  . RLS (restless legs syndrome) 02/07/2012  . DDD (degenerative disc disease), lumbar 12/18/2009  . MDD (major depressive disorder), recurrent episode ([place]) 12/10/2007  . POSTHERPETIC NEURALGIA 04/20/2007  . HLD (hyperlipidemia) 11/24/2006  . PLMD (periodic limb movement disorder) 11/24/2006  . Essential hypertension 11/24/2006  . GERD 11/24/2006  . GASTRIC ULCER 11/24/2006  . Osteoarthritis 11/24/2006  . Osteoporosis with pathological fracture 11/24/2006    Past Surgical History:  Procedure Laterality Date  . ABDOMINAL HYSTERECTOMY  Age 65 - 45   S/P TAH  . abdominal ultrasound  04/23/2004   Gallstones  . BREAST LUMPECTOMY WITH RADIOACTIVE SEED LOCALIZATION Right 10/2016   breast lumpectomy with radioactive seed localization and margin assessment for complex sclerosing lesion [facility])  . BREAST LUMPECTOMY WITH RADIOACTIVE SEED  LOCALIZATION Right 11/05/2016   Procedure: RIGHT BREAST LUMPECTOMY WITH RADIOACTIVE SEED LOCALIZATION;  Surgeon: Fanny Skates, MD;  Location: [place];  Service: General;  Laterality: Right;  . CARDIOVERSION N/A 01/31/2017   Procedure: CARDIOVERSION;  Surgeon: Wellington Hampshire, MD;  Location: [facility];  Service: Cardiovascular;  Laterality: N/A;  . CARDIOVERSION N/A 03/03/2017   Procedure: CARDIOVERSION;  Surgeon: Wellington Hampshire, MD;  Location: [place] [facility];  Service: Cardiovascular;  Laterality: N/A;  . CARDIOVERSION N/A 04/02/2017   Procedure: CARDIOVERSION;  Surgeon: Wellington Hampshire, MD;  Location: [facility];  Service: Cardiovascular;  Laterality: N/A;  . CARDIOVERSION N/A 01/16/2018   Procedure: CARDIOVERSION;  Surgeon: Wellington Hampshire, MD;  Location: [facility];  Service: Cardiovascular;  Laterality: N/A;  . CATARACT EXTRACTION  05/2010   left eye  . CERVICAL DISCECTOMY  1992   Fusion (Dr. Joya Salm)  . ESI Bilateral 01/2016   S1 transforaminal ESI x3  . ESOPHAGOGASTRODUODENOSCOPY  01/01/2002   with ulcer, bx. neg; + stricture gastric ulcer with hemorrhage  . ESOPHAGOGASTRODUODENOSCOPY  04/23/2004   Esophageal stricture -- dilated  . nuclear stress test  03/2012   no ischemia  . SKIN CANCER EXCISION  05/20/2011   BCC from nose  . US ECHOCARDIOGRAPHY  03/2012   in flutter, EF 60%, mild-mod aortic, mitral, tricuspid regurg, mildly dilated LA    Prior to Admission medications   Medication Sig Start Date End Date Taking? Authorizing Provider  acetaminophen (TYLENOL) 500 MG tablet Take 1,000 mg every 8 (eight) hours as needed by mouth for moderate pain.     [provider]  acyclovir (ZOVIRAX) 400 MG tablet Take 400 mg 2 (two) times daily by mouth.     [provider]  amiodarone (PACERONE) 200 MG tablet TAKE ONE TABLET BY MOUTH EVERY DAY 04/14/18   Rise Mu, PA-C  atorvastatin (LIPITOR) 20 MG tablet TAKE ONE TABLET EVERY DAY 04/14/18   Ria Bush, MD  DULoxetine (CYMBALTA) 60 MG capsule TAKE 1 CAPSULE EVERY DAY 04/14/18   Ria Bush, MD  ELIQUIS 2.5 MG TABS tablet TAKE 1 TABLET BY MOUTH TWICE DAILY 01/16/18   Wellington Hampshire, MD  furosemide (LASIX) 20 MG tablet Take 1 tablet (20 mg total) by mouth daily. 03/30/18 06/28/18  Rise Mu, PA-C  gabapentin (NEURONTIN) 100 MG capsule Take 2 capsules (200 mg total) by mouth 3 (three) times daily. 02/25/18   Ria Bush, MD  metoprolol tartrate (LOPRESSOR) 25 MG tablet Take 0.5 tablets (12.5 mg total) by mouth 2 (two) times daily. 05/26/18 08/24/18  Wellington Hampshire, MD  oxyCODONE (ROXICODONE) 5 MG immediate release tablet Take 0.5 tablets (2.5 mg total) by mouth every 8 (eight) hours as needed. 07/03/18 07/03/19  Rudene Re, MD  pantoprazole (PROTONIX) 40 MG tablet TAKE 1 TABLET BY MOUTH DAILY 04/14/18   Ria Bush, MD  prednisoLONE acetate (PRED FORTE) 1 % ophthalmic suspension Place 1 drop into the left eye daily.     [provider]  rOPINIRole (REQUIP) 1 MG tablet TAKE TWO TABLETS BY MOUTH AT BEDTIME 05/11/18   Ria Bush, MD  tiZANidine (ZANAFLEX) 2 MG  tablet Take 2 mg by mouth every evening.  12/04/17   Ria Bush, MD  traMADol (ULTRAM) 50 MG tablet Take 1 tablet (50 mg total) by mouth 2 (two) times daily. 12/04/17   Ria Bush, MD  Vitamin D, Ergocalciferol, (DRISDOL) 1.25 MG (50000 UT) CAPS capsule TAKE 1 CAPSULE BY MOUTH ONCE WEEKLY 03/17/18   Ria Bush, MD    Allergies Penicillins  Family History  Problem Relation Age of Onset  . Emphysema Mother   . Heart disease Father        MI  . Stroke Father        first at age 39.  . Stroke Sister   . Diabetes Sister   . Hypertension Brother   . Heart disease Brother        Heart Valve  . Stroke Brother   . Diabetes Brother   . Cancer Brother        esophageal  . Diabetes Brother   . Diabetes Brother   . Colon cancer Neg Hx     Social History Social History    Tobacco Use  . Smoking status: Former Smoker    Packs/day: 0.25    Years: 1.00    Pack years: 0.25    Last attempt to quit: 04/08/1976    Years since quitting: 42.2  . Smokeless tobacco: Never Used  . Tobacco comment: pt smokes occasionally "socially"  Substance Use Topics  . Alcohol use: No    Alcohol/week: 1.0 standard drinks    Types: 1 Glasses of wine per week    Frequency: Never    Comment: occassional  . Drug use: No    Review of Systems Constitutional: Negative for fever. Eyes: Negative for visual changes. ENT: + facial injury and neck pain Cardiovascular: Negative for chest injury. Respiratory: Negative for shortness of breath. Negative for chest wall injury. Gastrointestinal: Negative for abdominal pain or injury. Genitourinary: Negative for dysuria. Musculoskeletal: Negative for back injury, negative for arm or leg pain. Skin: Negative for laceration/abrasions. Neurological: + head injury.  ____________________________________________   PHYSICAL EXAM:  VITAL SIGNS: ED Triage Vitals  Enc Vitals Group     BP 07/03/18 1027 (!) 213/69     Pulse Rate 07/03/18 1026 79     Resp 07/03/18 1026 18     Temp 07/03/18 1026 97.9 F (36.6 C)     Temp Source 07/03/18 1026 Oral     SpO2 07/03/18 1026 99 %     Weight 07/03/18 1025 122 lb (55.3 kg)     Height 07/03/18 1025 5\' 4"  (1.626 m)     Head Circumference --      Peak Flow --      Pain Score 07/03/18 1025 4     Pain Loc --      Pain Edu? --      Excl. in [place]? --     Full spinal precautions maintained throughout the trauma exam. Constitutional: Alert and oriented. No acute distress. Does not appear intoxicated. HEENT Head: Normocephalic, bruising ot the R forehead. Face: No facial bony tenderness. Stable midface Ears: No hemotympanum bilaterally. No Battle sign Eyes: R periorbital hematoma. No eye injury. PERRL. No raccoon eyes Nose: Nontender. No epistaxis. No rhinorrhea Mouth/Throat: Mucous membranes are  moist. No oropharyngeal blood. No dental injury. Airway patent without stridor. Normal voice. Neck: no C-collar in place. No midline c-spine tenderness. Tender to palpation on the R paraspinal region Cardiovascular: Normal rate, regular rhythm. Normal and symmetric distal pulses are  present in all extremities. Pulmonary/Chest: Chest wall is stable and nontender to palpation/compression. Normal respiratory effort. Breath sounds are normal. No crepitus.  Abdominal: Soft, nontender, non distended. Musculoskeletal: Nontender with normal full range of motion in all extremities. No deformities. No thoracic or lumbar midline spinal tenderness. Pelvis is stable. Skin: Skin is warm, dry and intact. Abrasion on the R knee Psychiatric: Speech and behavior are appropriate. Neurological: Normal speech and language. Moves all extremities to command. No gross focal neurologic deficits are appreciated.  Glascow Coma Score: 4 - Opens eyes on own 6 - Follows simple motor commands 5 - Alert and oriented GCS: 15  ____________________________________________   LABS (all labs ordered are listed, but only abnormal results are displayed)  Labs Reviewed - No data to display ____________________________________________  EKG  none  ____________________________________________  RADIOLOGY  I have personally reviewed the images performed during this visit and I agree with the Radiologist's read.   Interpretation by Radiologist:  Ct Head Wo Contrast  Result Date: 07/03/2018 CLINICAL DATA:  Knocked down by a closing door, bruising and swelling to RIGHT periorbital region and cheek, RIGHT-side neck pain, minor head trauma, history hypertension, rheumatoid arthritis, former smoker EXAM: CT HEAD WITHOUT CONTRAST CT MAXILLOFACIAL WITHOUT CONTRAST CT CERVICAL SPINE WITHOUT CONTRAST TECHNIQUE: Multidetector CT imaging of the head, cervical spine, and maxillofacial structures were performed using the standard protocol  without intravenous contrast. Multiplanar CT image reconstructions of the cervical spine and maxillofacial structures were also generated. Right side of face marked with BB. COMPARISON:  CT facial bones 08/08/2016, CT head 04/12/2007 FINDINGS: CT HEAD FINDINGS Brain: Generalized atrophy. Normal ventricular morphology. No midline shift or mass effect. Small vessel chronic ischemic changes of deep cerebral white matter. No intracranial hemorrhage, mass lesion, evidence of acute infarction, or extra-axial fluid collection. Vascular: Atherosclerotic calcification of internal carotid arteries at skull base Skull: Demineralized but intact Other: N/A CT MAXILLOFACIAL FINDINGS Osseous: Diffuse osseous demineralization. TMJ alignment normal bilaterally with degenerative changes on RIGHT. Facial bones intact. No facial bone fractures. Orbits: Bony orbits intact.  No intraorbital edema or air Sinuses: Mucosal retention cyst LEFT maxillary sinus. Remaining paranasal sinuses, mastoid air cells and middle ear cavities clear Soft tissues: RIGHT periorbital hematoma soft tissue swelling. Remaining facial soft tissues unremarkable. CT CERVICAL SPINE FINDINGS Alignment: Mild anterolisthesis at C4-C5 and retrolisthesis at C5-C6. Skull base and vertebrae: Osseous demineralization. Skull base intact. Incomplete posterior arch C1, developmental anomaly. Multilevel facet degenerative changes. Multilevel degenerative disc disease changes. Vertebral body heights maintained without fracture or subluxation. Soft tissues and spinal canal: Prevertebral soft tissues normal thickness. Atherosclerotic calcifications at carotid bifurcations and at cranial aspect of aortic arch. Disc levels:  No additional abnormalities Upper chest: Minimal biapical lung scarring without infiltrate Other: N/A IMPRESSION: Atrophy with small vessel chronic ischemic changes of deep cerebral white matter. No acute intracranial abnormalities. RIGHT periorbital hematoma  without acute facial bone abnormalities. Multilevel degenerative disc and facet disease changes of the cervical spine. No acute cervical spine abnormalities. Significant scattered atherosclerotic calcifications in the carotid systems. Electronically Signed   By: Lavonia Dana M.D.   On: 07/03/2018 11:31   Ct Cervical Spine Wo Contrast  Result Date: 07/03/2018 CLINICAL DATA:  Knocked down by a closing door, bruising and swelling to RIGHT periorbital region and cheek, RIGHT-side neck pain, minor head trauma, history hypertension, rheumatoid arthritis, former smoker EXAM: CT HEAD WITHOUT CONTRAST CT MAXILLOFACIAL WITHOUT CONTRAST CT CERVICAL SPINE WITHOUT CONTRAST TECHNIQUE: Multidetector CT imaging of the head, cervical spine,  and maxillofacial structures were performed using the standard protocol without intravenous contrast. Multiplanar CT image reconstructions of the cervical spine and maxillofacial structures were also generated. Right side of face marked with BB. COMPARISON:  CT facial bones 08/08/2016, CT head 04/12/2007 FINDINGS: CT HEAD FINDINGS Brain: Generalized atrophy. Normal ventricular morphology. No midline shift or mass effect. Small vessel chronic ischemic changes of deep cerebral white matter. No intracranial hemorrhage, mass lesion, evidence of acute infarction, or extra-axial fluid collection. Vascular: Atherosclerotic calcification of internal carotid arteries at skull base Skull: Demineralized but intact Other: N/A CT MAXILLOFACIAL FINDINGS Osseous: Diffuse osseous demineralization. TMJ alignment normal bilaterally with degenerative changes on RIGHT. Facial bones intact. No facial bone fractures. Orbits: Bony orbits intact.  No intraorbital edema or air Sinuses: Mucosal retention cyst LEFT maxillary sinus. Remaining paranasal sinuses, mastoid air cells and middle ear cavities clear Soft tissues: RIGHT periorbital hematoma soft tissue swelling. Remaining facial soft tissues unremarkable. CT  CERVICAL SPINE FINDINGS Alignment: Mild anterolisthesis at C4-C5 and retrolisthesis at C5-C6. Skull base and vertebrae: Osseous demineralization. Skull base intact. Incomplete posterior arch C1, developmental anomaly. Multilevel facet degenerative changes. Multilevel degenerative disc disease changes. Vertebral body heights maintained without fracture or subluxation. Soft tissues and spinal canal: Prevertebral soft tissues normal thickness. Atherosclerotic calcifications at carotid bifurcations and at cranial aspect of aortic arch. Disc levels:  No additional abnormalities Upper chest: Minimal biapical lung scarring without infiltrate Other: N/A IMPRESSION: Atrophy with small vessel chronic ischemic changes of deep cerebral white matter. No acute intracranial abnormalities. RIGHT periorbital hematoma without acute facial bone abnormalities. Multilevel degenerative disc and facet disease changes of the cervical spine. No acute cervical spine abnormalities. Significant scattered atherosclerotic calcifications in the carotid systems. Electronically Signed   By: Lavonia Dana M.D.   On: 07/03/2018 11:31   Ct Maxillofacial Wo Contrast  Result Date: 07/03/2018 CLINICAL DATA:  Knocked down by a closing door, bruising and swelling to RIGHT periorbital region and cheek, RIGHT-side neck pain, minor head trauma, history hypertension, rheumatoid arthritis, former smoker EXAM: CT HEAD WITHOUT CONTRAST CT MAXILLOFACIAL WITHOUT CONTRAST CT CERVICAL SPINE WITHOUT CONTRAST TECHNIQUE: Multidetector CT imaging of the head, cervical spine, and maxillofacial structures were performed using the standard protocol without intravenous contrast. Multiplanar CT image reconstructions of the cervical spine and maxillofacial structures were also generated. Right side of face marked with BB. COMPARISON:  CT facial bones 08/08/2016, CT head 04/12/2007 FINDINGS: CT HEAD FINDINGS Brain: Generalized atrophy. Normal ventricular morphology. No  midline shift or mass effect. Small vessel chronic ischemic changes of deep cerebral white matter. No intracranial hemorrhage, mass lesion, evidence of acute infarction, or extra-axial fluid collection. Vascular: Atherosclerotic calcification of internal carotid arteries at skull base Skull: Demineralized but intact Other: N/A CT MAXILLOFACIAL FINDINGS Osseous: Diffuse osseous demineralization. TMJ alignment normal bilaterally with degenerative changes on RIGHT. Facial bones intact. No facial bone fractures. Orbits: Bony orbits intact.  No intraorbital edema or air Sinuses: Mucosal retention cyst LEFT maxillary sinus. Remaining paranasal sinuses, mastoid air cells and middle ear cavities clear Soft tissues: RIGHT periorbital hematoma soft tissue swelling. Remaining facial soft tissues unremarkable. CT CERVICAL SPINE FINDINGS Alignment: Mild anterolisthesis at C4-C5 and retrolisthesis at C5-C6. Skull base and vertebrae: Osseous demineralization. Skull base intact. Incomplete posterior arch C1, developmental anomaly. Multilevel facet degenerative changes. Multilevel degenerative disc disease changes. Vertebral body heights maintained without fracture or subluxation. Soft tissues and spinal canal: Prevertebral soft tissues normal thickness. Atherosclerotic calcifications at carotid bifurcations and at cranial aspect of aortic arch.  Disc levels:  No additional abnormalities Upper chest: Minimal biapical lung scarring without infiltrate Other: N/A IMPRESSION: Atrophy with small vessel chronic ischemic changes of deep cerebral white matter. No acute intracranial abnormalities. RIGHT periorbital hematoma without acute facial bone abnormalities. Multilevel degenerative disc and facet disease changes of the cervical spine. No acute cervical spine abnormalities. Significant scattered atherosclerotic calcifications in the carotid systems. Electronically Signed   By: Lavonia Dana M.D.   On: 07/03/2018 11:31      ____________________________________________   PROCEDURES  Procedure(s) performed: None Procedures Critical Care performed:  None ____________________________________________   INITIAL IMPRESSION / ASSESSMENT AND PLAN / ED COURSE   85 y.o. female with history as listed below including atrial fibrillation on Eliquis who presents for evaluation of head trauma after a closing door hit her in her face.  Patient has a right-sided periorbital hematoma and a forehead hematoma with no lacerations.  No midline CT and L-spine tenderness.  Also has an abrasion of her knee with no tenderness and full painless range of motion.  CT head, cervical spine, and face with no acute findings.  No ocular involvement.  Recommended ice and Tylenol.  Discussed return precautions for any signs of symptoms of delayed head bleed.  Discussed close follow-up with primary care doctor.    _________________________ 12:47 PM on 07/03/2018 -----------------------------------------  Patient's BP is very elevated. She is asymptomatic.  She reports that she did not take her medications this morning.  I recommended giving her medications here and monitoring her for 30 to 40 minutes to make sure her blood pressure trends down however patient reports "you guys charged too much for the medication that I have at home".  She wants to go home and take her medications at home.  She reports that she will check her blood pressure an hour later to ensure it has come down.  She is going to be discharged to the care of her daughter who will take her home.  Discussed pain management at home.   As part of my medical decision making, I reviewed the following data within the [facility] notes reviewed and incorporated, Old chart reviewed, Radiograph reviewed , Notes from prior ED visits and [place] Controlled Substance Database    Pertinent labs & imaging results that were available during my care of the patient were  reviewed by me and considered in my medical decision making (see chart for details).    ____________________________________________   FINAL CLINICAL IMPRESSION(S) / ED DIAGNOSES  Final diagnoses:  Fall, initial encounter  Injury of head, initial encounter  Facial hematoma, initial encounter      NEW MEDICATIONS STARTED DURING THIS VISIT:  ED Discharge Orders         Ordered    oxyCODONE (ROXICODONE) 5 MG immediate release tablet  Every 8 hours PRN     07/03/18 1226           Note:  This document was prepared using Dragon voice recognition software and may include unintentional dictation errors.    Alfred Levins, [place], MD 07/03/18 [place], [place], MD 07/03/18 [phone]

## 2018-07-03 NOTE — Telephone Encounter (Signed)
Pt fell this morning; the electronic door at front of building tried to close on pt and she was trying to keep door open and fell. Pt having pain in rt neck,shoulder & back when taking a deep breath. Pt cannot lift anything with rt hand; rt eye is black and rt eyelid is swollen (pt to put ice on eye) pt said there is an abrasion or cut over rt eye and rt leg. ? Hit head. No fever, cough,SOB, no traveling and no known exposure to covid or flu. Dr Darnell Level said since pt is on blood thinner and may need imaging pt should go to ED. Per pts chart review tab pt is at [facility] ED now.

## 2018-07-03 NOTE — Telephone Encounter (Signed)
ER records reviewed. Imaging reassuringly ok. Can we call Monday for update - ensure bp is better controlled (was very high in ER but she had not taken am meds yet).  Thanks.

## 2018-07-03 NOTE — ED Notes (Signed)
Nurse called pt's daughter Manuela Schwartz for ride home. Pt's daughter is on the way.

## 2018-07-03 NOTE — ED Triage Notes (Signed)
Pt reports door was closing and knocked her down. Bruising and swelling to right periorbital and cheek.  Pt c/o right side neck pain, no cervical tenderness in triage. No LOC. On eliquis. Has not had daily meds today yet.

## 2018-07-03 NOTE — ED Notes (Signed)
Dr. Alfred Levins was made aware of pt's BP. Pt is not wanting to take medications here for her BP. Pt stating "I missed my medications at 10 or 11." [place] in with pt to speak of her BP.

## 2018-07-03 NOTE — Discharge Instructions (Signed)
You were seen in the emergency department after a fall. Luckily all of your imaging studies did not show any evidence of injuries. Follow-up with you doctor within the next 2-3 days for further evaluation. Sometimes injuries can present at a later time and therefore it is imperative that you return to the emergency room if you have a severe headache, facial droop, neck pain, numbness or weakness of your extremities, slurred speech, difficulty finding words, chest pain, back pain, abdominal pain, or any other new symptoms that were not present during this visit. You may take Tylenol at home for your pain.  Pain control: Take tylenol 1000mg  every 8 hours. Take 2.5mg  of oxycodone every 6 hours for breakthrough pain. If you need the oxycodone make sure to take one senokot as well to prevent constipation.  Do not drink alcohol, drive or participate in any other potentially dangerous activities while taking this medication as it may make you sleepy. Do not take this medication with any other sedating medications, either prescription or over-the-counter.

## 2018-07-06 NOTE — Telephone Encounter (Signed)
Spoke with pt relaying message per Dr. Henriette Combs understanding and states her BP. Says her daughter is keeping an eye on it.  States she is very sore.

## 2018-07-10 ENCOUNTER — Other Ambulatory Visit: Payer: Self-pay | Admitting: Family Medicine

## 2018-07-10 NOTE — Telephone Encounter (Signed)
Gabapentin Last filled:  05/05/18, #180  Vit D Last rx:  03/17/18, #12  Last OV:  04/22/18, CPE Pt 2 Next OV:  1/20/221, CPE Pt 2

## 2018-07-27 IMAGING — MG 2D DIGITAL SCREENING BILATERAL MAMMOGRAM WITH CAD AND ADJUNCT TO
8 of 14 series · 8 of 30 positions shown · non-contrast
Comparison: Previous exam(s).

CLINICAL DATA: Screening.

EXAM:
2D DIGITAL SCREENING BILATERAL MAMMOGRAM WITH CAD AND ADJUNCT TOMO

[R CC (1 of 2)]
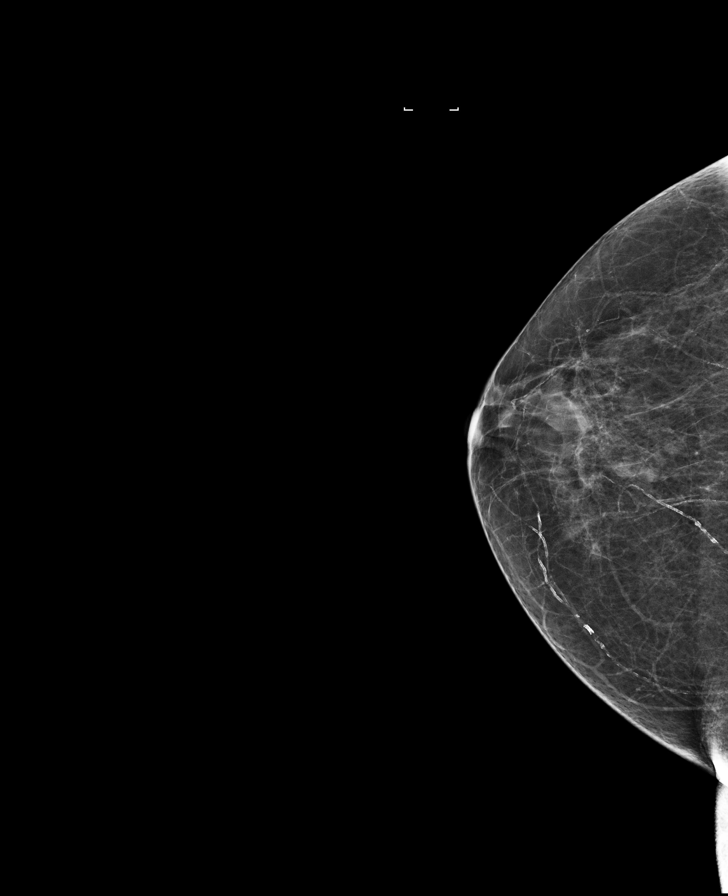

[L CC (1 of 2)]
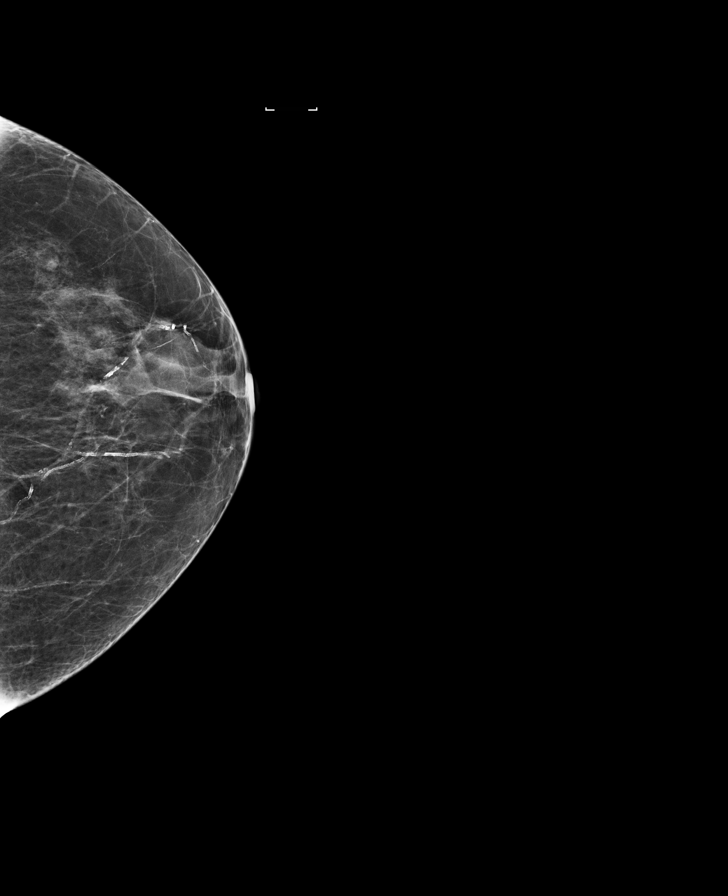

[L CC synth-2D]
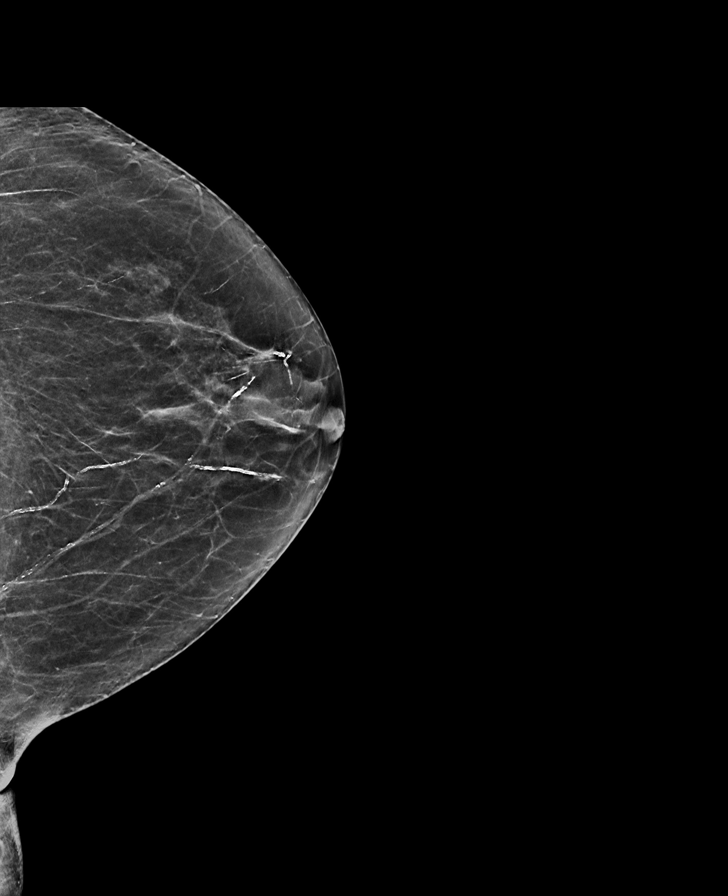

[L CC (2 of 2)]
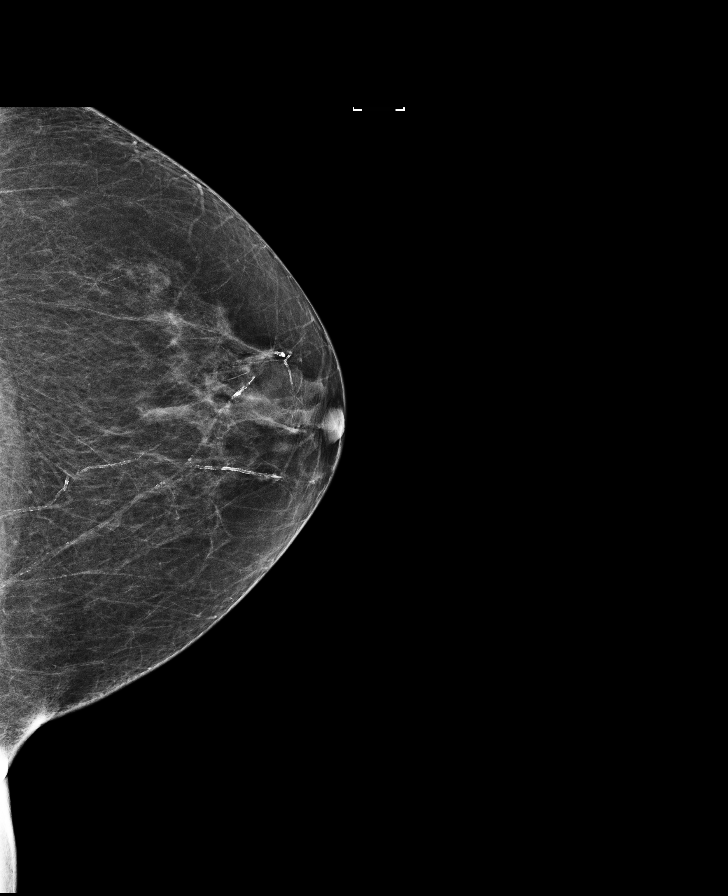

[R MLO]
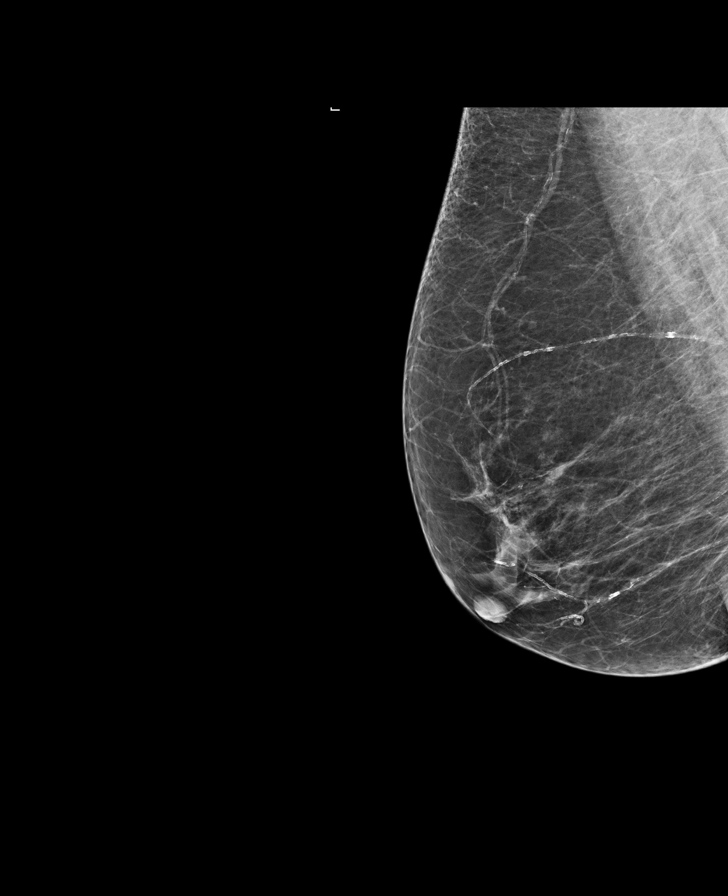

[L MLO synth-2D]
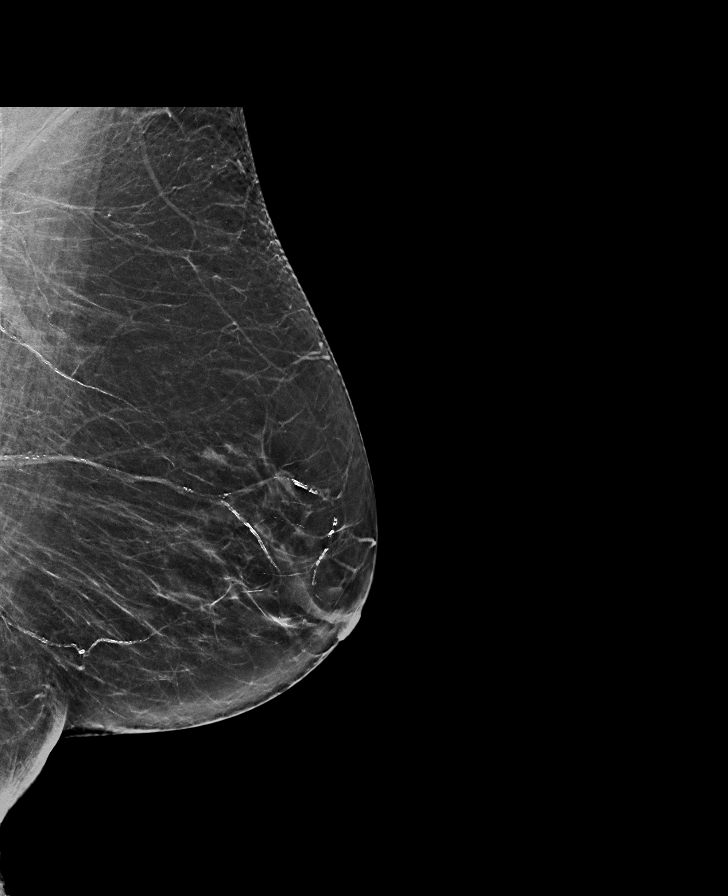

[R CC synth-2D]
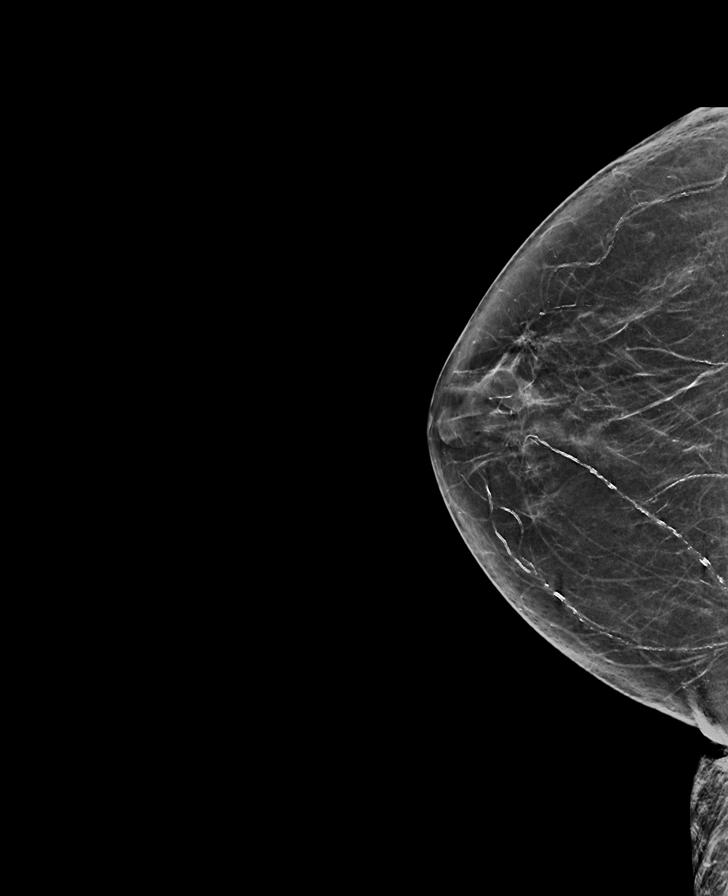

[R CC (2 of 2)]
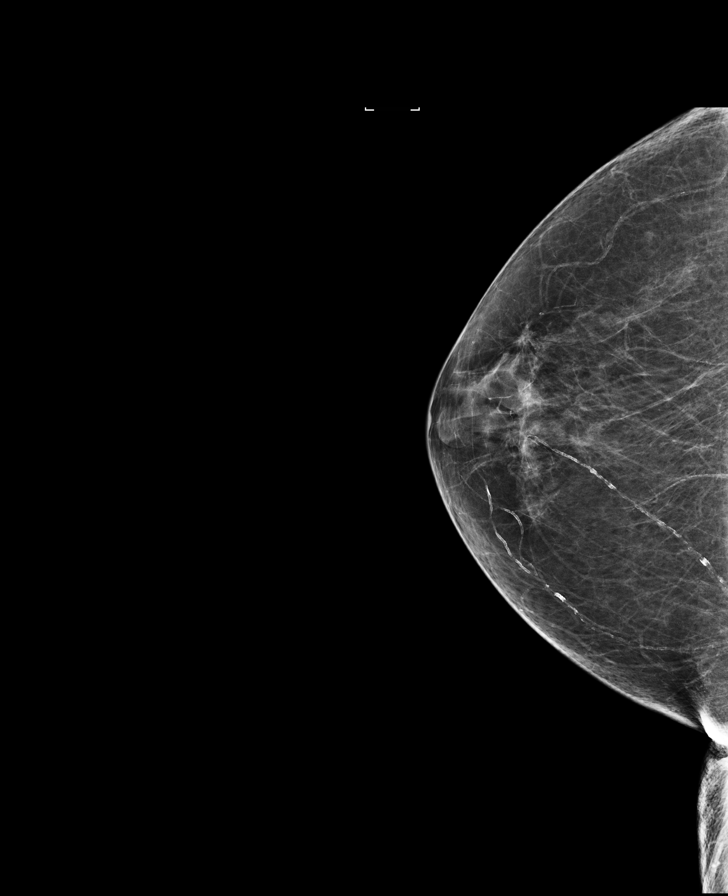

[8 of 30 positions shown; findings below may reference images not displayed]

ACR Breast Density Category b: There are scattered areas of
fibroglandular density.
FINDINGS: In the right breast, possible distortion warrants further
evaluation. In the left breast, no findings suspicious for
malignancy. Images were processed with CAD.
IMPRESSION: Further evaluation is suggested for possible distortion in the right
breast.

RECOMMENDATION:
Diagnostic mammogram and possibly ultrasound of the right breast.
(Code:[id number]-[id number])

The patient will be contacted regarding the findings, and additional
imaging will be scheduled.

BI-RADS CATEGORY  0: Incomplete. Need additional imaging evaluation
and/or prior mammograms for comparison.

## 2018-07-31 ENCOUNTER — Other Ambulatory Visit: Payer: Self-pay

## 2018-07-31 ENCOUNTER — Encounter: Payer: Self-pay | Admitting: Family Medicine

## 2018-07-31 ENCOUNTER — Ambulatory Visit (INDEPENDENT_AMBULATORY_CARE_PROVIDER_SITE_OTHER): Payer: Medicare Other | Admitting: Family Medicine

## 2018-07-31 DIAGNOSIS — M5416 Radiculopathy, lumbar region: Secondary | ICD-10-CM

## 2018-07-31 DIAGNOSIS — M79605 Pain in left leg: Secondary | ICD-10-CM

## 2018-07-31 DIAGNOSIS — M79671 Pain in right foot: Secondary | ICD-10-CM

## 2018-07-31 DIAGNOSIS — G2581 Restless legs syndrome: Secondary | ICD-10-CM | POA: Diagnosis not present

## 2018-07-31 DIAGNOSIS — M5136 Other intervertebral disc degeneration, lumbar region: Secondary | ICD-10-CM | POA: Diagnosis not present

## 2018-07-31 DIAGNOSIS — M79604 Pain in right leg: Secondary | ICD-10-CM | POA: Insufficient documentation

## 2018-07-31 DIAGNOSIS — G4761 Periodic limb movement disorder: Secondary | ICD-10-CM

## 2018-07-31 DIAGNOSIS — M79672 Pain in left foot: Secondary | ICD-10-CM

## 2018-07-31 DIAGNOSIS — G6289 Other specified polyneuropathies: Secondary | ICD-10-CM

## 2018-07-31 DIAGNOSIS — G8929 Other chronic pain: Secondary | ICD-10-CM

## 2018-07-31 NOTE — Assessment & Plan Note (Signed)
Anticipate due to neuropathy - discussed this. She was concerned this could be sign of Covid19. Reassured she does not exhibit symptoms of Covid19. Will further assess with in-office visit scheduled for Monday. In interim, trial higher gabapentin dose. Pt agrees with plan.

## 2018-07-31 NOTE — Progress Notes (Signed)
   [name] - 85 y.o. female  MRN [id number]  Date of Birth: 1930-03-04  PCP: Ria Bush, MD  This service was provided via telemedicine. Phone Visit performed on 07/31/2018    Rationale for phone visit along with limitations reviewed. Patient consented to telephone encounter. Interactive audio and video telecommunications were attempted between myself and [name], however failed due to patient not having access to video capability.  We continued and completed visit with audio over the phone.    Location of patient: at home Location of provider: in office [place] @ [facility] Name of referring provider: N/A   Names of persons and role in encounter: Provider: Ria Bush, MD  [name]  Other: N/A   Time on call: 3:33pm - 3:52pm   Subjective: CC: foot pain HPI:  Feels requip has significantly helped ability to sleep at night.   Noticing increasing burning pain of bilateral feet with redness, but feet feel cold when she touches them. Pain and cold spreading up L>R leg especially bad.   ABIs normal, TBIs abnormal (01/2018).   Reading in newspaper yesterday and concerned about covid19 affecting circulation. Denies cough dyspnea or fever. No known covid exposure.   Has been checking BP - well controlled recently.   Objective/Observations:   No physical exam or vital signs collected unless specifically identified below.   There were no vitals taken for this visit.   Respiratory status: speaks in complete sentences without evident shortness of breath.   Assessment/Plan:  PLMD (periodic limb movement disorder) requip is helping.   RLS (restless legs syndrome) requip is helping.   Peripheral neuropathy Anticipate worsening neuropathy - discussed trial higher gabapentin dose 300mg  per dose.  Bilateral leg and foot pain Anticipate due to neuropathy - discussed this. She was concerned this could be sign of Covid19. Reassured she  does not exhibit symptoms of Covid19. Will further assess with in-office visit scheduled for Monday. In interim, trial higher gabapentin dose. Pt agrees with plan.    I discussed the assessment and treatment plan with the patient. The patient was provided an opportunity to ask questions and all were answered. The patient agreed with the plan and demonstrated an understanding of the instructions.  Lab Orders  No laboratory test(s) ordered today    No orders of the defined types were placed in this encounter.   The patient was advised to call back or seek an in-person evaluation if the symptoms worsen or if the condition fails to improve as anticipated.  Ria Bush, MD

## 2018-07-31 NOTE — Assessment & Plan Note (Signed)
Anticipate worsening neuropathy - discussed trial higher gabapentin dose 300mg  per dose.

## 2018-07-31 NOTE — Assessment & Plan Note (Signed)
requip is helping.

## 2018-08-03 ENCOUNTER — Other Ambulatory Visit: Payer: Self-pay

## 2018-08-03 ENCOUNTER — Telehealth: Payer: Self-pay

## 2018-08-03 ENCOUNTER — Encounter: Payer: Self-pay | Admitting: Family Medicine

## 2018-08-03 ENCOUNTER — Ambulatory Visit (INDEPENDENT_AMBULATORY_CARE_PROVIDER_SITE_OTHER): Payer: Medicare Other | Admitting: Family Medicine

## 2018-08-03 VITALS — BP 124/66 | HR 66 | Temp 97.8°F | Ht 62.5 in | Wt 138.5 lb

## 2018-08-03 DIAGNOSIS — G6289 Other specified polyneuropathies: Secondary | ICD-10-CM

## 2018-08-03 DIAGNOSIS — M79671 Pain in right foot: Secondary | ICD-10-CM

## 2018-08-03 DIAGNOSIS — M79604 Pain in right leg: Secondary | ICD-10-CM | POA: Diagnosis not present

## 2018-08-03 DIAGNOSIS — K5900 Constipation, unspecified: Secondary | ICD-10-CM

## 2018-08-03 DIAGNOSIS — M79605 Pain in left leg: Secondary | ICD-10-CM

## 2018-08-03 DIAGNOSIS — M79672 Pain in left foot: Secondary | ICD-10-CM

## 2018-08-03 DIAGNOSIS — G8929 Other chronic pain: Secondary | ICD-10-CM

## 2018-08-03 MED ORDER — GABAPENTIN 300 MG PO CAPS: 300.0000 mg | ORAL_CAPSULE | Freq: Three times a day (TID) | ORAL | 1 refills | Status: DC

## 2018-08-03 NOTE — Patient Instructions (Addendum)
Labs today. I will touch base with Dr Fletcher Anon about next step.  We may refer you to neurology to discuss nerve conduction studies of the legs  New gabapentin dose will be 300mg  three times daily, but let us know if this causes too much sedation. Try senna (sennakot) over the counter for bowels 1 a day as needed - continue miralax and let me know effect.

## 2018-08-03 NOTE — Telephone Encounter (Signed)
[facility] Patient Name: STEWART PIMENTA Gender: Female DOB: [birth year] Age: 85 Y 25 M 28 D Return Phone Number: [phone] (Primary) Address: City/State/Zip: [place] [address] Client [facility] - Client Client Site [place] Physician Ria Bush - MD Contact Type Call Who Is Calling Patient / Member / Family / Caregiver Call Type Triage / Clinical Caller Name [name] Relationship To Patient Mother Return Phone Number [phone] (Primary) Chief Complaint Medication Question (non symptomatic) Reason for Call Medication Question / Request Initial Comment Caller says that her mother had a phone appt this afternoon. Her memory is not good. She says the doctor changed one of her meds and she needs to know if it is to one pill 3xday, or all 3 once a day. Translation No Nurse Assessment Nurse: London Pepper, RN, Jeneen Rinks Date/Time Eilene Ghazi Time): 07/31/2018 5:20:49 PM Confirm and document reason for call. If symptomatic, describe symptoms. ---Caller states pt takes gabapentin 200 mg 2x a day, but states she thinks the doctor told her to switch how often to take it to 200mg  3x a day, but does not remember. Went into [place] chart and no directions on how often to take. Has the patient had close contact with a person known or suspected to have the novel coronavirus illness OR traveled / lives in area with major community spread (including international travel) in the last 14 days from the onset of symptoms? * If Asymptomatic, screen for exposure and travel within the last 14 days. ---Not Applicable Does the patient have any new or worsening symptoms? ---No Please document clinical information provided and list any resource used. ---will page oncall for pt. Guidelines Guideline Title Affirmed Question Affirmed  Notes Nurse Date/Time (Eastern Time) Disp. Time Eilene Ghazi Time) Disposition Final User 07/31/2018 5:26:36 PM Paged On Call back to [facility], [place] 07/31/2018 5:27:22 PM Paged On Call back to [facility], RN, Jeneen Rinks 07/31/2018 5:42:00 PM Paged On Call back to [facility], RNJeneen Rinks 07/31/2018 5:46:07 PM Clinical Call Yes London Pepper, RN, Joanne Gavel NOTE: All timestamps contained within this report are represented as [place] Standard Time. CONFIDENTIALTY NOTICE: This fax transmission is intended only for the addressee. It contains information that is legally privileged, confidential or otherwise protected from use or disclosure. If you are not the intended recipient, you are strictly prohibited from reviewing, disclosing, copying using or disseminating any of this information or taking any action in reliance on or regarding this information. If you have received this fax in error, please notify us immediately by telephone so that we can arrange for its return to [place]. Phone: [phone], Toll-Free: [phone], Fax: [phone] Page: 2 of 2 Call Id: [id number] Paging DoctorName Phone DateTime Result/Outcome Message Type Notes Ria Bush - MD [phone] 07/31/2018 5:26:36 PM Paged On Call Back to Call Center Doctor Paged oncall number is not currently working for me. The dtr of an elderly pt is on the phone inquiring about a change in how often her mother is supposed to take gabapentin 200 mg, for neuropathy, from a visit today. Her mother can not remember. She states My call back # is [phone]. Thanks, [facility] [place], Betty - MD [phone] 07/31/2018 5:42:00 PM Called on Call provider - No message left Doctor Paged [place], Betty - MD 07/31/2018 5:45:37 PM Spoke with On Call - General Message Result caller states to instruct pt  to take gabapentin 300 mg twice a day.

## 2018-08-03 NOTE — Progress Notes (Signed)
BP 124/66 (BP Location: Left Arm, Patient Position: Sitting, Cuff Size: Normal)   Pulse 66   Temp 97.8 F (36.6 C) (Oral)   Ht 5' 2.5" (1.588 m)   Wt 138 lb 8 oz (62.8 kg)   SpO2 97%   BMI 24.93 kg/m    CC: burning pain in feet Subjective:    Patient ID: [name], female    DOB: [birth year], 85 y.o.   MRN: [id number]  HPI: [name] is a 85 y.o. female presenting on 08/03/2018 for Foot Pain (C/o burning in bilateral feet, worsened. Sxs 6-12 mos ago. Says they are cold to the touch and red on bottom and top. )   See prior note for details. Seen for virtual visit on Friday. Per that note:  Noticing increasing burning pain of bilateral feet with redness, but feet feel cold when she touches them. Pain and cold spreading up L>R leg especially bad to knee.   Symptoms ongoing for almost 12 months. Previously attributed to neuropathy and we increased gabapentin to 300mg  per dose. She has been taking BID. Some tingling/numbness of feet.   + fatigue and pain in ankles/feet with walking a small distance. No calf pain however.  ABIs normal, TBIs abnormal (01/2018).   No alcohol use No DM hx.   Noticing worsening constipation despite miralax 1 capful daily. Has suppository without benefit.      Relevant past medical, surgical, family and social history reviewed and updated as indicated. Interim medical history since our last visit reviewed. Allergies and medications reviewed and updated. Outpatient Medications Prior to Visit  Medication Sig Dispense Refill  . acetaminophen (TYLENOL) 500 MG tablet Take 1,000 mg every 8 (eight) hours as needed by mouth for moderate pain.     Marland Kitchen acyclovir (ZOVIRAX) 400 MG tablet Take 400 mg 2 (two) times daily by mouth.     Marland Kitchen amiodarone (PACERONE) 200 MG tablet TAKE ONE TABLET BY MOUTH EVERY DAY 90 tablet 3  . atorvastatin (LIPITOR) 20 MG tablet TAKE ONE TABLET BY MOUTH EVERY DAY 90 tablet 2  . DULoxetine (CYMBALTA) 60 MG capsule TAKE 1 CAPSULE  BY MOUTH EVERY DAY 90 capsule 1  . ELIQUIS 2.5 MG TABS tablet TAKE 1 TABLET BY MOUTH TWICE DAILY 180 tablet 1  . metoprolol tartrate (LOPRESSOR) 25 MG tablet Take 0.5 tablets (12.5 mg total) by mouth 2 (two) times daily. 180 tablet 3  . pantoprazole (PROTONIX) 40 MG tablet TAKE ONE TABLET BY MOUTH EVERY DAY 90 tablet 2  . prednisoLONE acetate (PRED FORTE) 1 % ophthalmic suspension Place 1 drop into the left eye daily.     Marland Kitchen rOPINIRole (REQUIP) 1 MG tablet TAKE TWO TABLETS BY MOUTH AT BEDTIME 180 tablet 3  . tiZANidine (ZANAFLEX) 2 MG tablet Take 2 mg by mouth every evening.     . traMADol (ULTRAM) 50 MG tablet Take 1 tablet (50 mg total) by mouth 2 (two) times daily.  0  . Vitamin D, Ergocalciferol, (DRISDOL) 1.25 MG (50000 UT) CAPS capsule TAKE ONE CAPSULE EACH WEEK 12 capsule 0  . gabapentin (NEURONTIN) 100 MG capsule TAKE 2 CAPULES BY MOUTH THREE TIMES DAILY 180 capsule 3  . furosemide (LASIX) 20 MG tablet Take 1 tablet (20 mg total) by mouth daily. 90 tablet 3  . oxyCODONE (ROXICODONE) 5 MG immediate release tablet Take 0.5 tablets (2.5 mg total) by mouth every 8 (eight) hours as needed. 8 tablet 0   No facility-administered medications prior to visit.  Per HPI unless specifically indicated in ROS section below Review of Systems Objective:    BP 124/66 (BP Location: Left Arm, Patient Position: Sitting, Cuff Size: Normal)   Pulse 66   Temp 97.8 F (36.6 C) (Oral)   Ht 5' 2.5" (1.588 m)   Wt 138 lb 8 oz (62.8 kg)   SpO2 97%   BMI 24.93 kg/m   Wt Readings from Last 3 Encounters:  08/03/18 138 lb 8 oz (62.8 kg)  07/03/18 122 lb (55.3 kg)  05/26/18 138 lb (62.6 kg)    Physical Exam Vitals signs and nursing note reviewed.  Constitutional:      Appearance: Normal appearance.  HENT:     Head:     Comments: Resolving bruising under R eye from fall 06/2018 Musculoskeletal: Normal range of motion.        General: Tenderness present. No swelling.     Right lower leg: Edema (tr)  present.     Left lower leg: Edema (tr) present.     Comments: Diminished pedal pulses bilaterally. Increased tenderness to palpation at R medial leg at prior trauma site  Skin:    General: Skin is warm.     Capillary Refill: Capillary refill takes more than 3 seconds.     Comments: Purple discoloration to L>R feet with prolonged capillary refill.   Neurological:     Mental Status: She is alert.     Comments: Sensation to light touch largely intact, diminished to temperature  Psychiatric:        Mood and Affect: Mood normal.       Results for orders placed or performed in visit on 99/83/38  Basic metabolic panel  Result Value Ref Range   Glucose 89 65 - 99 mg/dL   BUN 13 8 - 27 mg/dL   Creatinine, Ser 0.94 0.57 - 1.00 mg/dL   GFR calc non Af Amer 55 (L) >59 mL/min/1.73   GFR calc Af Amer 64 >59 mL/min/1.73   BUN/Creatinine Ratio 14 12 - 28   Sodium 141 134 - 144 mmol/L   Potassium 3.6 3.5 - 5.2 mmol/L   Chloride 99 96 - 106 mmol/L   CO2 26 20 - 29 mmol/L   Calcium 9.1 8.7 - 10.3 mg/dL   Assessment & Plan:   Problem List Items Addressed This Visit    Peripheral neuropathy    Continue higher gabapentin dose. No significant alcohol use, no h/o DM. Update labs to eval for other causes of neuropathy.       Relevant Medications   gabapentin (NEURONTIN) 300 MG capsule   Other Relevant Orders   Vitamin B12   Folate   TSH   CBC with Differential/Platelet   Basic metabolic panel   Constipation    Noticing worsening trouble with this - discussed miralax and sennakot daily.       Chronic leg pain   Relevant Medications   gabapentin (NEURONTIN) 300 MG capsule   Bilateral leg and foot pain - Primary    Symptoms seem more consistent with neuropathy than claudication. She has delayed capillary refill on exam today as well as diminished pulses. She had normal ABIs and abnormal TBIs 01/2018. Will touch base with cards about direction of further evaluation - consider NCS vs further  eval for arterial circulation. In interim, will increase gabapentin to 300mg  TID as long as tolerated well.       Relevant Orders   CBC with Differential/Platelet       Meds ordered  this encounter  Medications  . gabapentin (NEURONTIN) 300 MG capsule    Sig: Take 1 capsule (300 mg total) by mouth 3 (three) times daily.    Dispense:  270 capsule    Refill:  1    Note new dose   Orders Placed This Encounter  Procedures  . Vitamin B12  . Folate  . TSH  . CBC with Differential/Platelet  . Basic metabolic panel    Patient Instructions  Labs today. I will touch base with Dr Fletcher Anon about next step.  We may refer you to neurology to discuss nerve conduction studies of the legs  New gabapentin dose will be 300mg  three times daily, but let us know if this causes too much sedation. Try senna (sennakot) over the counter for bowels 1 a day as needed - continue miralax and let me know effect.   Follow up plan: Return if symptoms worsen or fail to improve.  Ria Bush, MD

## 2018-08-03 NOTE — Telephone Encounter (Signed)
Med list has gabapentin 100 mg taking 2 caps tid; from 07/31/18 virtual visit has trail higher gabapentin dose 300 mg/dose. Pt already has in office visit 08/03/18 at 2:30 with Dr Darnell Level.

## 2018-08-03 NOTE — Telephone Encounter (Signed)
I took care of this Friday.  Will see pt today.

## 2018-08-04 ENCOUNTER — Other Ambulatory Visit: Payer: Self-pay | Admitting: Family Medicine

## 2018-08-04 ENCOUNTER — Encounter: Payer: Self-pay | Admitting: Family Medicine

## 2018-08-04 ENCOUNTER — Other Ambulatory Visit: Payer: Self-pay | Admitting: Cardiovascular Disease

## 2018-08-04 DIAGNOSIS — I4819 Other persistent atrial fibrillation: Secondary | ICD-10-CM

## 2018-08-04 DIAGNOSIS — E538 Deficiency of other specified B group vitamins: Secondary | ICD-10-CM | POA: Insufficient documentation

## 2018-08-04 LAB — CBC WITH DIFFERENTIAL/PLATELET
Basophils Absolute: 0.2 10*3/uL — ABNORMAL HIGH (ref 0.0–0.1)
Basophils Relative: 2.4 % (ref 0.0–3.0)
Eosinophils Absolute: 0.1 10*3/uL (ref 0.0–0.7)
Eosinophils Relative: 1.9 % (ref 0.0–5.0)
HCT: 38.9 % (ref 36.0–46.0)
Hemoglobin: 13.2 g/dL (ref 12.0–15.0)
Lymphocytes Relative: 17.4 % (ref 12.0–46.0)
Lymphs Abs: 1.2 10*3/uL (ref 0.7–4.0)
MCHC: 33.9 g/dL (ref 30.0–36.0)
MCV: 97.6 fl (ref 78.0–100.0)
Monocytes Absolute: 0.6 10*3/uL (ref 0.1–1.0)
Monocytes Relative: 8.9 % (ref 3.0–12.0)
Neutro Abs: 4.7 10*3/uL (ref 1.4–7.7)
Neutrophils Relative %: 69.4 % (ref 43.0–77.0)
Platelets: 260 10*3/uL (ref 150.0–400.0)
RBC: 3.98 Mil/uL (ref 3.87–5.11)
RDW: 14.3 % (ref 11.5–15.5)
WBC: 6.8 10*3/uL (ref 4.0–10.5)

## 2018-08-04 LAB — VITAMIN B12: Vitamin B-12: 101 pg/mL — ABNORMAL LOW (ref 211–911)

## 2018-08-04 LAB — BASIC METABOLIC PANEL
BUN: 14 mg/dL (ref 6–23)
CO2: 31 mEq/L (ref 19–32)
Calcium: 9.2 mg/dL (ref 8.4–10.5)
Chloride: 103 mEq/L (ref 96–112)
Creatinine, Ser: 0.85 mg/dL (ref 0.40–1.20)
GFR: 63.24 mL/min (ref >60.00)
Glucose, Bld: 89 mg/dL (ref 70–99)
Potassium: 4.2 mEq/L (ref 3.5–5.1)
Sodium: 142 mEq/L (ref 135–145)

## 2018-08-04 LAB — TSH: TSH: 2.3 u[IU]/mL (ref 0.35–4.50)

## 2018-08-04 LAB — FOLATE: Folate: >23.3 ng/mL (ref >5.9)

## 2018-08-04 NOTE — Assessment & Plan Note (Signed)
Noticing worsening trouble with this - discussed miralax and sennakot daily.

## 2018-08-04 NOTE — Telephone Encounter (Signed)
Please review for refill.  

## 2018-08-04 NOTE — Assessment & Plan Note (Signed)
Symptoms seem more consistent with neuropathy than claudication. She has delayed capillary refill on exam today as well as diminished pulses. She had normal ABIs and abnormal TBIs 01/2018. Will touch base with cards about direction of further evaluation - consider NCS vs further eval for arterial circulation. In interim, will increase gabapentin to 300mg  TID as long as tolerated well.

## 2018-08-04 NOTE — Assessment & Plan Note (Addendum)
Continue higher gabapentin dose. No significant alcohol use, no h/o DM. Update labs to eval for other causes of neuropathy.

## 2018-08-08 ENCOUNTER — Telehealth: Payer: Self-pay

## 2018-08-08 NOTE — Telephone Encounter (Signed)
I am working [facility] and saw the fax from [place].   She said she was able to have a large BM last night. She had some blood with it. I advised her that is probably normal because se had to strain and already had hemorrhoids. I told her what Dr Silvio Pate usually tells people: Take Miralax twice a day until regular or too soft stools occur. Then go to once a day.

## 2018-08-09 NOTE — Telephone Encounter (Addendum)
Noted thanks agree. We discussed constipatino at latest office visit.

## 2018-08-10 NOTE — Telephone Encounter (Signed)
[facility] Patient Name: [name] Gender: Female DOB: 10/26/1929 Age: 85 Y 2 M 4 D Return Phone Number: [phone] (Primary), [phone] (Secondary), [phone] (Alternate) Address: City/State/Zip: [place] [address] Client [facility] Night - Client Client Site [place] Physician Ria Bush - MD Contact Type Call Who Is Calling Patient / Member / Family / Caregiver Call Type Triage / Clinical Relationship To Patient Self Return Phone Number [phone] (Secondary) Chief Complaint Constipation Reason for Call Symptomatic / Request for [place] reports she is constipated. Translation No Nurse Assessment Nurse: Chesley Noon, RN, [name] Date/Time Eilene Ghazi Time): 08/07/2018 5:42:19 PM Confirm and document reason for call. If symptomatic, describe symptoms. ---Caller states she has not had a BM in 2 weeks, maybe longer. She tried some enemas but they did not help, she has talked to the doctor about it. She has been on Miralax and Sennakot. She has hemorrhoids that are painful too. Has the patient had close contact with a person known or suspected to have the novel coronavirus illness OR traveled / lives in area with major community spread (including international travel) in the last 14 days from the onset of symptoms? * If Asymptomatic, screen for exposure and travel within the last 14 days. ---Not Applicable Does the patient have any new or worsening symptoms? ---Yes Will a triage be completed? ---Yes Related visit to physician within the last 2 weeks? ---No Does the PT have any chronic conditions? (i.e. diabetes, asthma, this includes High risk factors for pregnancy, etc.) ---Yes List chronic conditions. ---high blood pressure, falls Is this a behavioral health or substance abuse call?  ---No Guidelines Guideline Title Affirmed Question Affirmed Notes Nurse Date/Time (Eastern Time) Constipation [1] Rectal pain or fullness from fecal impaction (rectum full of stool) AND [2] NOT better after SITZ bath, suppository or enema Jamas Lav 08/07/2018 5:46:36 PM PLEASE NOTE: All timestamps contained within this report are represented as [place] Standard Time. CONFIDENTIALTY NOTICE: This fax transmission is intended only for the addressee. It contains information that is legally privileged, confidential or otherwise protected from use or disclosure. If you are not the intended recipient, you are strictly prohibited from reviewing, disclosing, copying using or disseminating any of this information or taking any action in reliance on or regarding this information. If you have received this fax in error, please notify us immediately by telephone so that we can arrange for its return to [place]. Phone: [phone], Toll-Free: [phone], Fax: [phone] Page: 2 of 2 Call Id: [id number] [place]. Time Eilene Ghazi Time) Disposition Final User 08/07/2018 5:53:49 PM See HCP within 4 Hours (or PCP triage) Yes Chesley Noon, RN, Leland Johns Disagree/Comply Disagree Caller Understands Yes PreDisposition Call Doctor Care Advice Given Per Guideline SEE HCP WITHIN 4 HOURS (OR PCP TRIAGE): * IF OFFICE WILL BE CLOSED AND NO PCP (PRIMARY CARE PROVIDER) SECOND-LEVEL TRIAGE: You need to be seen within the next 3 or 4 hours. A nearby Urgent Care Center [facility]) is often a good source of care. Another choice is to go to the ED. Go sooner if you become worse. CALL BACK IF: * You become worse. CARE ADVICE given per Constipation (Adult) guideline. Referrals GO TO FACILITY UNDECIDED

## 2018-08-10 NOTE — Telephone Encounter (Signed)
Please see the addressed response below from provider and [name], [place].

## 2018-08-11 ENCOUNTER — Ambulatory Visit (INDEPENDENT_AMBULATORY_CARE_PROVIDER_SITE_OTHER): Payer: Medicare Other

## 2018-08-11 DIAGNOSIS — E538 Deficiency of other specified B group vitamins: Secondary | ICD-10-CM

## 2018-08-11 MED ORDER — CYANOCOBALAMIN 1000 MCG/ML IJ SOLN
1000.0000 ug | Freq: Once | INTRAMUSCULAR | Status: AC
  Administered 2018-08-11: 1000 ug via INTRAMUSCULAR

## 2018-08-11 NOTE — Progress Notes (Signed)
Per orders of Dr. Danise Mina, injection of Vitamin B12 given by Diamond Nickel, RN. Patient tolerated injection well.  Weekly injections X 1 month started today.  Administered IM to Right deltoid.

## 2018-08-13 ENCOUNTER — Other Ambulatory Visit: Payer: Self-pay | Admitting: Family Medicine

## 2018-08-13 ENCOUNTER — Other Ambulatory Visit: Payer: Self-pay | Admitting: Internal Medicine

## 2018-08-13 NOTE — Telephone Encounter (Signed)
Sent in

## 2018-08-13 NOTE — Telephone Encounter (Signed)
Please review for refill. LOV directions were changed from 1 tab to 1/2 tab twice a day.

## 2018-08-18 ENCOUNTER — Ambulatory Visit (INDEPENDENT_AMBULATORY_CARE_PROVIDER_SITE_OTHER): Payer: Medicare Other

## 2018-08-18 DIAGNOSIS — E538 Deficiency of other specified B group vitamins: Secondary | ICD-10-CM

## 2018-08-18 MED ORDER — CYANOCOBALAMIN 1000 MCG/ML IJ SOLN
1000.0000 ug | Freq: Once | INTRAMUSCULAR | Status: AC
  Administered 2018-08-18: 1000 ug via INTRAMUSCULAR

## 2018-08-18 NOTE — Progress Notes (Signed)
Per orders of Dr. Danise Mina, injection of weekly Vitamin B12 given by Diamond Nickel, RN.  Administered to Left Deltoid.  Patient tolerated injection well.

## 2018-08-25 ENCOUNTER — Ambulatory Visit (INDEPENDENT_AMBULATORY_CARE_PROVIDER_SITE_OTHER): Payer: Medicare Other

## 2018-08-25 DIAGNOSIS — E538 Deficiency of other specified B group vitamins: Secondary | ICD-10-CM | POA: Diagnosis not present

## 2018-08-25 MED ORDER — CYANOCOBALAMIN 1000 MCG/ML IJ SOLN
1000.0000 ug | Freq: Once | INTRAMUSCULAR | Status: AC
  Administered 2018-08-25: 1000 ug via INTRAMUSCULAR

## 2018-08-25 NOTE — Progress Notes (Signed)
Per orders of Dr. Danise Mina, injection of Vitamin B12 given by Diamond Nickel, RN.  Administered to right deltoid.  Patient tolerated injection well.

## 2018-08-28 DIAGNOSIS — M5416 Radiculopathy, lumbar region: Secondary | ICD-10-CM | POA: Diagnosis not present

## 2018-08-28 DIAGNOSIS — M5136 Other intervertebral disc degeneration, lumbar region: Secondary | ICD-10-CM | POA: Diagnosis not present

## 2018-08-28 DIAGNOSIS — M48062 Spinal stenosis, lumbar region with neurogenic claudication: Secondary | ICD-10-CM | POA: Diagnosis not present

## 2018-08-28 DIAGNOSIS — M62830 Muscle spasm of back: Secondary | ICD-10-CM | POA: Diagnosis not present

## 2018-09-01 ENCOUNTER — Ambulatory Visit: Payer: Medicare Other

## 2018-09-03 ENCOUNTER — Ambulatory Visit (INDEPENDENT_AMBULATORY_CARE_PROVIDER_SITE_OTHER): Payer: Medicare Other | Admitting: *Deleted

## 2018-09-03 DIAGNOSIS — E538 Deficiency of other specified B group vitamins: Secondary | ICD-10-CM

## 2018-09-03 MED ORDER — CYANOCOBALAMIN 1000 MCG/ML IJ SOLN
1000.0000 ug | Freq: Once | INTRAMUSCULAR | Status: AC
  Administered 2018-09-03: 1000 ug via INTRAMUSCULAR

## 2018-09-03 NOTE — Progress Notes (Signed)
Per orders of Dr. Einar Pheasant, injection of B12 inj given by Tammi Sou. Patient tolerated injection well.  Dr. Danise Mina is out of the office this afternoon

## 2018-09-04 ENCOUNTER — Telehealth: Payer: Self-pay | Admitting: Family Medicine

## 2018-09-04 NOTE — Telephone Encounter (Signed)
[employer] benefits discussed and pt scheduled.  Pt owes approximatley $230.

## 2018-09-13 ENCOUNTER — Other Ambulatory Visit: Payer: Self-pay | Admitting: Family Medicine

## 2018-09-14 NOTE — Telephone Encounter (Signed)
Last office visit 08/03/2018 for foot pain.  Last refilled 07/13/2018 for #12 with no refills.  Last Vit D level 02/11/2018 which was normal at 52.06 ng/ml.  CPE scheduled for 04/28/2019.

## 2018-09-15 ENCOUNTER — Ambulatory Visit (INDEPENDENT_AMBULATORY_CARE_PROVIDER_SITE_OTHER): Payer: Medicare Other

## 2018-09-15 DIAGNOSIS — M8000XD Age-related osteoporosis with current pathological fracture, unspecified site, subsequent encounter for fracture with routine healing: Secondary | ICD-10-CM

## 2018-09-15 MED ORDER — DENOSUMAB 60 MG/ML ~~LOC~~ SOSY
60.0000 mg | PREFILLED_SYRINGE | Freq: Once | SUBCUTANEOUS | Status: AC
  Administered 2018-09-15: 60 mg via SUBCUTANEOUS

## 2018-09-15 NOTE — Progress Notes (Signed)
Per orders of Dr. Danise Mina, injection of denosumab 60mg  subcue given by Diamond Nickel, RN.  Administered to patient's left arm subcutaneously.   Patient tolerated injection well.

## 2018-09-17 ENCOUNTER — Telehealth: Payer: Self-pay | Admitting: Cardiovascular Disease

## 2018-09-17 NOTE — Telephone Encounter (Signed)
Pt c/o Shortness Of Breath: STAT if SOB developed within the last 24 hours or pt is noticeably SOB on the phone  1. Are you currently SOB (can you hear that pt is SOB on the phone)? A little, walked to mailbox  2. How long have you been experiencing SOB? Last 2 or 3 weeks   3. Are you SOB when sitting or when up moving around? Moving around mostly   4. Are you currently experiencing any other symptoms? heart will start beating fast

## 2018-09-17 NOTE — Telephone Encounter (Signed)
Tried to call patient, phone just rang busy.

## 2018-09-21 ENCOUNTER — Telehealth: Payer: Self-pay

## 2018-09-21 NOTE — Telephone Encounter (Signed)
Left message for patient to call back at number she left. Pt is scheduled with Dr. Fletcher Anon this Friday.

## 2018-09-21 NOTE — Telephone Encounter (Signed)

## 2018-09-21 NOTE — Telephone Encounter (Signed)
Patient calling to check status - patient number is [phone]

## 2018-09-25 ENCOUNTER — Other Ambulatory Visit: Payer: Self-pay

## 2018-09-25 ENCOUNTER — Telehealth: Payer: Self-pay | Admitting: Cardiovascular Disease

## 2018-09-25 ENCOUNTER — Ambulatory Visit (INDEPENDENT_AMBULATORY_CARE_PROVIDER_SITE_OTHER): Payer: Medicare Other | Admitting: Nurse Practitioner

## 2018-09-25 ENCOUNTER — Ambulatory Visit: Payer: Medicare Other | Admitting: Cardiovascular Disease

## 2018-09-25 ENCOUNTER — Encounter: Payer: Self-pay | Admitting: Nurse Practitioner

## 2018-09-25 ENCOUNTER — Other Ambulatory Visit
Admission: RE | Admit: 2018-09-25 | Discharge: 2018-09-25 | Disposition: A | Discharge status: Home / Self Care | Payer: Medicare Other | Attending: Nurse Practitioner | Admitting: Nurse Practitioner

## 2018-09-25 VITALS — BP 158/82 | HR 80 | Ht 62.5 in | Wt 132.1 lb

## 2018-09-25 DIAGNOSIS — I5032 Chronic diastolic (congestive) heart failure: Secondary | ICD-10-CM | POA: Diagnosis not present

## 2018-09-25 DIAGNOSIS — I4819 Other persistent atrial fibrillation: Secondary | ICD-10-CM

## 2018-09-25 DIAGNOSIS — E782 Mixed hyperlipidemia: Secondary | ICD-10-CM

## 2018-09-25 DIAGNOSIS — R5383 Other fatigue: Secondary | ICD-10-CM

## 2018-09-25 DIAGNOSIS — I10 Essential (primary) hypertension: Secondary | ICD-10-CM | POA: Insufficient documentation

## 2018-09-25 DIAGNOSIS — R0609 Other forms of dyspnea: Secondary | ICD-10-CM | POA: Diagnosis not present

## 2018-09-25 DIAGNOSIS — Z79899 Other long term (current) drug therapy: Secondary | ICD-10-CM | POA: Diagnosis not present

## 2018-09-25 DIAGNOSIS — I4892 Unspecified atrial flutter: Secondary | ICD-10-CM

## 2018-09-25 LAB — CBC WITH DIFFERENTIAL/PLATELET
Abs Immature Granulocytes: 0.02 10*3/uL (ref 0.00–0.07)
Basophils Absolute: 0 10*3/uL (ref 0.0–0.1)
Basophils Relative: 1 %
Eosinophils Absolute: 0.1 10*3/uL (ref 0.0–0.5)
Eosinophils Relative: 1 %
HCT: 43.7 % (ref 36.0–46.0)
Hemoglobin: 14.8 g/dL (ref 12.0–15.0)
Immature Granulocytes: 0 %
Lymphocytes Relative: 17 %
Lymphs Abs: 1.1 10*3/uL (ref 0.7–4.0)
MCH: 32.7 pg (ref 26.0–34.0)
MCHC: 33.9 g/dL (ref 30.0–36.0)
MCV: 96.5 fL (ref 80.0–100.0)
Monocytes Absolute: 0.7 10*3/uL (ref 0.1–1.0)
Monocytes Relative: 10 %
Neutro Abs: 4.4 10*3/uL (ref 1.7–7.7)
Neutrophils Relative %: 71 %
Platelets: 247 10*3/uL (ref 150–400)
RBC: 4.53 MIL/uL (ref 3.87–5.11)
RDW: 13.1 % (ref 11.5–15.5)
WBC: 6.3 10*3/uL (ref 4.0–10.5)
nRBC: 0 % (ref 0.0–0.2)

## 2018-09-25 LAB — COMPREHENSIVE METABOLIC PANEL
ALT: 22 U/L (ref 0–44)
AST: 21 U/L (ref 15–41)
Albumin: 4.6 g/dL (ref 3.5–5.0)
Alkaline Phosphatase: 55 U/L (ref 38–126)
Anion gap: 12 (ref 5–15)
BUN: 19 mg/dL (ref 8–23)
CO2: 26 mmol/L (ref 22–32)
Calcium: 9.3 mg/dL (ref 8.9–10.3)
Chloride: 102 mmol/L (ref 98–111)
Creatinine, Ser: 1.01 mg/dL — ABNORMAL HIGH (ref 0.44–1.00)
GFR calc Af Amer: 58 mL/min — ABNORMAL LOW (ref >60)
GFR calc non Af Amer: 50 mL/min — ABNORMAL LOW (ref >60)
Glucose, Bld: 113 mg/dL — ABNORMAL HIGH (ref 70–99)
Potassium: 3.4 mmol/L — ABNORMAL LOW (ref 3.5–5.1)
Sodium: 140 mmol/L (ref 135–145)
Total Bilirubin: 0.8 mg/dL (ref 0.3–1.2)
Total Protein: 7.5 g/dL (ref 6.5–8.1)

## 2018-09-25 LAB — BRAIN NATRIURETIC PEPTIDE: B Natriuretic Peptide: 75 pg/mL (ref 0.0–100.0)

## 2018-09-25 NOTE — Telephone Encounter (Signed)
   Pt coming into office for face to face visit today.  Call to patient to review procedure for in office appt.   COVID-19 Pre-Screening Questions:  . In the past 7 to 10 days have you had a cough,  shortness of breath, headache, congestion, fever (100 or greater) body aches, chills, sore throat, or sudden loss of taste or sense of smell? NO . Have you been around anyone with known Covid 19. NO . Have you been around anyone who is awaiting Covid 19 test results in the past 7 to 10 days? NO . Have you been around anyone who has been exposed to Covid 19, or has mentioned symptoms of Covid 19 within the past 7 to 10 days? NO  If you have any concerns/questions about symptoms patients report during screening (either on the phone or at threshold). Contact the provider seeing the patient or DOD for further guidance.  If neither are available contact a member of the leadership team.   Daughter will be accompanying patient to office due to dementia like sx.   Called [facility] to make them aware.

## 2018-09-25 NOTE — Progress Notes (Addendum)
Cardiology Office Note   Date:  09/25/2018   ID:  [name], DOB 12/28/1929, MRN [id number]  PCP:  Ria Bush, MD  Cardiologist:   Jerilynn Mages. Fletcher Anon, MD   Chief Complaint  Patient presents with   other    SOB lightheadedness and restless legs, weak.Medications reviewed verbally with the patient.     History of Present Illness:  [name] is a 85 y.o. female who presents for a follow-up visit regarding dyspnea and fatigue. Nuclear stress test in 03/2012 showed no evidence of ischemia.   Most recent echocardiogram in December 2019 showed normal LV systolic function, mild aortic and mitral regurgitation, moderately dilated left atrium and moderate pulmonary hypertension with systolic pressure of 63 mmHg. She was noted to have atrial flutter in October 2019 and was scheduled for cardioversion but was noted to be back in sinus rhythm on the day of procedure. The patient was most recently started on small dose furosemide 20 mg once daily given leg edema and echocardiogram finding of pulmonary hypertension in 03/2018.   She was last seen in clinic in February of this year, at which time she reported fatigue but was otherwise doing reasonably well.  She says that she was active through much of the winter without any significant dyspnea, chest pain, or palpitations.  She will frequently ambulate on her own, though her daughter notes that she has stability issues and her daughter generally encourages her to use her Rollator.  Since the onset of the COVID 26 pandemic, [name] has been more or less stuck in her apartment.  Her activity level has dropped and she says she is watching a lot of TV.  Over the past month or so, she has noticed an increase in dyspnea on exertion and fatigue.  She says sometimes just getting out of bed in the morning causes her to feel tired and short of breath.  She was seen by primary care in April and did not have those complaints at that time.  Lab work at  that time was relatively unremarkable.  Despite dyspnea, she has not had any significant lower extremity edema and denies chest pain, PND, orthopnea, dizziness, syncope, or early satiety.  She did have a brief episode of palpitations last night, which she describes as occasional skipped beats.  She has not had any prolonged tachypalpitations or known recurrence of atrial fibrillation/flutter.  She is in sinus rhythm today.  Past Medical History:  Diagnosis Date   Abdominal aortic atherosclerosis ([place]) 09/2015   by xray   Anxiety    BCC (basal cell carcinoma of skin) 05/2011   on nose   Breast lesion    a. diagnosed with complex sclerosing lesion with calcifications of the right breast in 08/2016   Chest pain    a. nuclear stress test 03/2012: normal   DDD (degenerative disc disease), lumbosacral 12/06/1992   Gallstones 09/2015   incidentally by xray   GERD (gastroesophageal reflux disease) 05/1999   HERPES ZOSTER 04/13/2007   Qualifier: Diagnosis of  By: [name], [name]    History of shingles    ophthalmic, takes acyclovir daily   Hyperlipidemia 12/2003   Hypertension 1990   Mitral regurgitation    a. echo 03/2012: EF 60-65%, no RWMA, mild to mod AI/MR, mod TR, PASP 37 mmHg   Osteoarthritis 08/21/1989   Osteoporosis with fracture 11/1997   with compression fracture T12   Persistent atrial fibrillation 03/16/2012   a. CHADS2VASc => 5 (  HTN, age x 2, vascular disease, sex category)-->Eliquis 5 BID;  b. Recurrent AF in 06/2016;  c. 01/2017 s/p DCCV;  d. 02/2017 recurrent AFib->Amio added->DCCV; e. 03/2017 recurrent AFib, s/p reload of amio and DCCV 04/02/2017   Rheumatoid arthritis ([place])    RLS (restless legs syndrome)     Past Surgical History:  Procedure Laterality Date   ABDOMINAL HYSTERECTOMY  Age 44 - 76   S/P TAH   abdominal ultrasound  04/23/2004   Gallstones   BREAST LUMPECTOMY WITH RADIOACTIVE SEED LOCALIZATION Right 10/2016   breast  lumpectomy with radioactive seed localization and margin assessment for complex sclerosing lesion ([place])   BREAST LUMPECTOMY WITH RADIOACTIVE SEED LOCALIZATION Right 11/05/2016   Procedure: RIGHT BREAST LUMPECTOMY WITH RADIOACTIVE SEED LOCALIZATION;  Surgeon: Fanny Skates, MD;  Location: [facility];  Service: General;  Laterality: Right;   CARDIOVERSION N/A 01/31/2017   Procedure: CARDIOVERSION;  Surgeon: Wellington Hampshire, MD;  Location: [facility];  Service: Cardiovascular;  Laterality: N/A;   CARDIOVERSION N/A 03/03/2017   Procedure: CARDIOVERSION;  Surgeon: Wellington Hampshire, MD;  Location: [facility];  Service: Cardiovascular;  Laterality: N/A;   CARDIOVERSION N/A 04/02/2017   Procedure: CARDIOVERSION;  Surgeon: Wellington Hampshire, MD;  Location: [facility];  Service: Cardiovascular;  Laterality: N/A;   CARDIOVERSION N/A 01/16/2018   Procedure: CARDIOVERSION;  Surgeon: Wellington Hampshire, MD;  Location: [facility];  Service: Cardiovascular;  Laterality: N/A;   CATARACT EXTRACTION  05/2010   left eye   CERVICAL DISCECTOMY  1992   Fusion (Dr. Joya Salm)   ESI Bilateral 01/2016   S1 transforaminal ESI x3   ESOPHAGOGASTRODUODENOSCOPY  01/01/2002   with ulcer, bx. neg; + stricture gastric ulcer with hemorrhage   ESOPHAGOGASTRODUODENOSCOPY  04/23/2004   Esophageal stricture -- dilated   nuclear stress test  03/2012   no ischemia   SKIN CANCER EXCISION  05/20/2011   BCC from nose   US ECHOCARDIOGRAPHY  03/2012   in flutter, EF 60%, mild-mod aortic, mitral, tricuspid regurg, mildly dilated LA     Current Outpatient Medications  Medication Sig Dispense Refill   acetaminophen (TYLENOL) 500 MG tablet Take 1,000 mg every 8 (eight) hours as needed by mouth for moderate pain.      acyclovir (ZOVIRAX) 400 MG tablet Take 400 mg 2 (two) times daily by mouth.      amiodarone (PACERONE) 200 MG tablet TAKE ONE TABLET BY MOUTH EVERY DAY 90 tablet 3   atorvastatin (LIPITOR) 20  MG tablet TAKE ONE TABLET BY MOUTH EVERY DAY 90 tablet 2   cyanocobalamin (,VITAMIN B-12,) 1000 MCG/ML injection Inject 1 mL (1,000 mcg total) into the muscle once a week.     DULoxetine (CYMBALTA) 60 MG capsule TAKE 1 CAPSULE BY MOUTH EVERY DAY 90 capsule 1   ELIQUIS 2.5 MG TABS tablet TAKE ONE TABLET TWICE DAILY 180 tablet 1   furosemide (LASIX) 20 MG tablet Take 1 tablet (20 mg total) by mouth daily. 90 tablet 3   gabapentin (NEURONTIN) 300 MG capsule Take 1 capsule (300 mg total) by mouth 3 (three) times daily. 270 capsule 1   metoprolol tartrate (LOPRESSOR) 25 MG tablet Take 0.5 tablets (12.5 mg total) by mouth 2 (two) times daily. 90 tablet 2   pantoprazole (PROTONIX) 40 MG tablet TAKE ONE TABLET BY MOUTH EVERY DAY 90 tablet 2   prednisoLONE acetate (PRED FORTE) 1 % ophthalmic suspension Place 1 drop into the left eye daily.      rOPINIRole (REQUIP)  1 MG tablet TAKE TWO TABLETS BY MOUTH AT BEDTIME 180 tablet 3   tiZANidine (ZANAFLEX) 2 MG tablet Take 2 mg by mouth every evening.      traMADol (ULTRAM) 50 MG tablet Take 1 tablet (50 mg total) by mouth 2 (two) times daily.  0   Vitamin D, Ergocalciferol, (DRISDOL) 1.25 MG (50000 UT) CAPS capsule TAKE 1 CAPSULE BY MOUTH EACH WEEK 12 capsule 0   No current facility-administered medications for this visit.     Allergies:   Penicillins   ROS: Dyspnea and fatigue as outlined above.  Restless legs w/ chronic leg pain.  Otherwise, review of systems are positive for none.   All other systems are reviewed and negative.    PHYSICAL EXAM: VS:  BP (!) 158/82 (BP Location: Left Arm, Patient Position: Sitting, Cuff Size: Normal)    Pulse 80    Ht 5' 2.5" (1.588 m)    Wt 132 lb 1.9 oz (59.9 kg)    BMI 23.78 kg/m  , BMI Body mass index is 23.78 kg/m. GEN: Thin, in no acute distress  HEENT: normal  Neck: no JVD, carotid bruits, or masses Cardiac: RRR,no murmurs, rubs, or gallops.  No lower extremity edema.  DP/PT 1+ bilaterally.     Respiratory: Respirations regular and unlabored, clear to auscultation. GI: soft, nontender, nondistended, + BS MS: no deformity or atrophy  Skin: warm and dry, no rash Neuro:  Strength and sensation are intact Psych: euthymic mood, full affect  EKG: Regular sinus rhythm, 80, nonspecific ST/T changes.  12-lead rhythm strip confirms sinus rhythm.  Recent Labs: 08/03/2018: TSH 2.30 09/25/2018: ALT 22; BUN 19; Creatinine, Ser 1.01; Hemoglobin 14.8; Platelets 247; Potassium 3.4; Sodium 140   Lipid Panel    Component Value Date/Time   CHOL 135 02/11/2018 1423   TRIG 113.0 02/11/2018 1423   HDL 59.20 02/11/2018 1423   CHOLHDL 2 02/11/2018 1423   VLDL 22.6 02/11/2018 1423   LDLCALC 53 02/11/2018 1423   LDLDIRECT 173.1 02/19/2010 0908     Wt Readings from Last 3 Encounters:  09/25/18 132 lb 1.9 oz (59.9 kg)  08/03/18 138 lb 8 oz (62.8 kg)  07/03/18 122 lb (55.3 kg)     No flowsheet data found.  ASSESSMENT AND PLAN:  1.  Dyspnea on exertion and fatigue: Patient has a somewhat long history of fatigue but notes that over the past month, she has had increasing dyspnea on exertion.  She has been more inactive over the past couple of months in the setting of COVID-19 and having to remain in her apartment at all times.  She is euvolemic on examination.  She has not had any chest pain.  EKG today shows sinus rhythm.  She did have labs in April which were unremarkable and I will repeat a CBC, seeing that, and also add a BNP today since her symptoms of dyspnea started following that visit.  If labs remain unremarkable, in light of relatively recent echo in December, I will arrange for a lexiscan MV as dyspnea could be an anginal equivalent.    2.  Paroxysmal atypical atrial flutter/Persistent Atrial fibrillation: Maintaining sinus rhythm at a rate of 80 bpm on EKG today.  I confirmed this with rhythm strip.  She remains on amiodarone 200 mg daily as well as metoprolol 12.5 mg twice daily.   Metoprolol dose was reduced at her last visit in the setting of bradycardia previously.  She remains anticoagulated with Eliquis and I will follow-up a  CBC today given symptoms of dyspnea.  She is not aware of any hematuria, melena, or bright red blood per rectum.  3. Essential hypertension: Blood pressure is elevated today however, she notes that she dropped all of her medicines on the floor earlier this week and she is not sure that she has been taking them entirely correctly.  In that setting, her daughter is going to help her rearrange her medicines and I will not make any changes today.  3. Hyperlipidemia: She remains on atorvastatin 20 mg daily with most recent LDL of 53 in November 2019.  4.  Chronic diastolic heart failure: Normal LV function with pulmonary hypertension by echo in December 2019.  She has had increasing dyspnea but is euvolemic on examination.  Heart rate stable though blood pressure elevated in the setting of a mixup with her medicines as outlined above.  Following up labs including a BNP today.    5.  Disposition: Follow-up lab work and potentially stress test.  Follow-up in clinic in 1 month or sooner if necessary.  Signed,  Murray Hodgkins, NP  09/25/2018 3:42 PM    [facility]

## 2018-09-25 NOTE — Telephone Encounter (Signed)
New Message     [name] is wondering if theres anything she needs to do before getting her to the appt today     Please call

## 2018-09-25 NOTE — Patient Instructions (Addendum)
Medication Instructions:  Your physician recommends that you continue on your current medications as directed. Please refer to the Current Medication list given to you today.  If you need a refill on your cardiac medications before your next appointment, please call your pharmacy.   Lab work: Your physician recommends that you return for lab work in: TODAY (CBC, CMET, BNP) - Please go to the [facility]. You will check in at the front desk to the right as you walk into the atrium. Valet Parking is offered if needed.   If you have labs (blood work) drawn today and your tests are completely normal, you will receive your results only by: Marland Kitchen MyChart Message (if you have MyChart) OR . A paper copy in the mail If you have any lab test that is abnormal or we need to change your treatment, we will call you to review the results.  Testing/Procedures: - None ordered.   Follow-Up: At [facility], you and your health needs are our priority.  As part of our continuing mission to provide you with exceptional heart care, we have created designated Provider Care Teams.  These Care Teams include your primary Cardiologist (physician) and Advanced Practice Providers (APPs -  Physician Assistants and Nurse Practitioners) who all work together to provide you with the care you need, when you need it. You will need a follow up appointment in [place], NP. You may see [name], MD or one of the following Advanced Practice Providers on your designated Care Team:   Murray Hodgkins, NP Christell Faith, PA-C . Marrianne Mood, PA-C

## 2018-09-28 ENCOUNTER — Telehealth: Payer: Self-pay | Admitting: *Deleted

## 2018-09-28 DIAGNOSIS — R079 Chest pain, unspecified: Secondary | ICD-10-CM

## 2018-09-28 MED ORDER — POTASSIUM CHLORIDE CRYS ER 20 MEQ PO TBCR: 40.0000 meq | EXTENDED_RELEASE_TABLET | Freq: Every day | ORAL | 1 refills | Status: DC

## 2018-09-28 NOTE — Telephone Encounter (Signed)
Spoke with patient's daughter, ok per DPR. SHe verbalized undrestanding of results and plan of care. She is aware to have patient start KDUR 40 mEq once a day. Rx sent to pharmacy. She also verified understanding of instructions below.     [place]  Your caregiver has ordered a Stress Test with nuclear imaging. The purpose of this test is to evaluate the blood supply to your heart muscle. This procedure is referred to as a "Non-Invasive Stress Test." This is because other than having an IV started in your vein, nothing is inserted or "invades" your body. Cardiac stress tests are done to find areas of poor blood flow to the heart by determining the extent of coronary artery disease (CAD). Some patients exercise on a treadmill, which naturally increases the blood flow to your heart, while others who are  unable to walk on a treadmill due to physical limitations have a pharmacologic/chemical stress agent called Lexiscan . This medicine will mimic walking on a treadmill by temporarily increasing your coronary blood flow.   Please note: these test may take anywhere between 2-4 hours to complete  PLEASE REPORT TO [facility] AT THE FIRST DESK WILL DIRECT YOU WHERE TO GO  Date of Procedure:__06/26/20_____________  Arrival Time for Procedure:_____07:45 am__________  Instructions regarding medication:   _XX_:  Hold betablocker(METOPROLOL) night before procedure and morning of procedure  _XX_:  Hold other medications as follows:____FUROSEMIDE__________  PLEASE NOTIFY THE OFFICE AT LEAST 24 HOURS IN ADVANCE IF YOU ARE UNABLE TO KEEP YOUR APPOINTMENT.  [phone] AND  PLEASE NOTIFY NUCLEAR MEDICINE AT [facility] AT LEAST 24 HOURS IN ADVANCE IF YOU ARE UNABLE TO KEEP YOUR APPOINTMENT. [phone]  How to prepare for your Myoview test:  1. Do not eat or drink after midnight 2. No caffeine for 24 hours prior to test 3. No smoking 24 hours prior to  test. 4. Your medication may be taken with water.  If your doctor stopped a medication because of this test, do not take that medication. 5. Ladies, please do not wear dresses.  Skirts or pants are appropriate. Please wear a short sleeve shirt. 6. No perfume, cologne or lotion. 7. Wear comfortable walking shoes. No heels!

## 2018-09-28 NOTE — Telephone Encounter (Signed)
-----   Message from Theora Gianotti, NP sent at 09/25/2018  4:22 PM EDT ----- -BNP is normal, confirming that fluid status is normal (was normal on exam). -Kidney and liver function is normal.  K is mildly low.  This is not likely to be causing her symptoms but we should start Kdur 4meq daily. -Blood counts are completely normal.  In light of normal labs and recent echo in 03/2018, though she has not been having chest pain, we must consider if dyspnea could be an anginal equivalent.  Please arrange for a lexiscan myoview.

## 2018-10-02 ENCOUNTER — Other Ambulatory Visit: Payer: Self-pay

## 2018-10-02 ENCOUNTER — Ambulatory Visit
Admission: RE | Admit: 2018-10-02 | Discharge: 2018-10-02 | Disposition: A | Discharge status: Home / Self Care | Payer: Medicare Other | Source: Ambulatory Visit | Attending: Nurse Practitioner | Admitting: Nurse Practitioner

## 2018-10-02 DIAGNOSIS — R079 Chest pain, unspecified: Secondary | ICD-10-CM | POA: Diagnosis not present

## 2018-10-02 LAB — NM MYOCAR MULTI W/SPECT W/WALL MOTION / EF
LV dias vol: 61 mL (ref 46–106)
LV sys vol: 18 mL
Peak HR: 86 {beats}/min
Percent HR: 64 %
Rest HR: 53 {beats}/min
SDS: 0
SRS: 0
SSS: 0
TID: 0.95

## 2018-10-02 MED ORDER — TECHNETIUM TC 99M TETROFOSMIN IV KIT
33.2720 | PACK | Freq: Once | INTRAVENOUS | Status: AC | PRN
  Administered 2018-10-02: 33.272 via INTRAVENOUS

## 2018-10-02 MED ORDER — REGADENOSON 0.4 MG/5ML IV SOLN
0.4000 mg | Freq: Once | INTRAVENOUS | Status: AC
  Administered 2018-10-02: 0.4 mg via INTRAVENOUS

## 2018-10-02 MED ORDER — TECHNETIUM TC 99M TETROFOSMIN IV KIT
9.9500 | PACK | Freq: Once | INTRAVENOUS | Status: AC | PRN
  Administered 2018-10-02: 9.95 via INTRAVENOUS

## 2018-10-06 ENCOUNTER — Ambulatory Visit: Payer: Medicare Other

## 2018-10-09 ENCOUNTER — Ambulatory Visit: Payer: Self-pay | Admitting: Family Medicine

## 2018-10-09 NOTE — Telephone Encounter (Signed)
Pt. Reports she has not had a "good bowel movement in 8-10 days." Has taken Miralax and Senokot. "I can feel some stool right there." States will try a Fleets enema. If no results, will call back.  Reason for Disposition . Treating constipation with Over-The-Counter (OTC) medicines, questions about  Answer Assessment - Initial Assessment Questions 1. STOOL PATTERN OR FREQUENCY: "How often do you pass bowel movements (BMs)?"  (Normal range: tid to q 3 days)  "When was the last BM passed?"       Usually goes every other day 2. STRAINING: "Do you have to strain to have a BM?"      Yes 3. RECTAL PAIN: "Does your rectum hurt when the stool comes out?" If so, ask: "Do you have hemorrhoids? How bad is the pain?"  (Scale 1-10; or mild, moderate, severe)     No 4. STOOL COMPOSITION: "Are the stools hard?"      Yes 5. BLOOD ON STOOLS: "Has there been any blood on the toilet tissue or on the surface of the BM?" If so, ask: "When was the last time?"      No 6. CHRONIC CONSTIPATION: "Is this a new problem for you?"  If no, ask: "How long have you had this problem?" (days, weeks, months)      Yes 7. CHANGES IN DIET: "Have there been any recent changes in your diet?"      No 8. MEDICATIONS: "Have you been taking any new medications?"     No 9. LAXATIVES: "Have you been using any laxatives or enemas?"  If yes, ask "What, how often, and when was the last time?"     Miralax and Senokot 10. CAUSE: "What do you think is causing the constipation?"        Unsure 11. OTHER SYMPTOMS: "Do you have any other symptoms?" (e.g., abdominal pain, fever, vomiting)       No 12. PREGNANCY: "Is there any chance you are pregnant?" "When was your last menstrual period?"       No  Protocols used: CONSTIPATION-A-AH

## 2018-10-13 ENCOUNTER — Other Ambulatory Visit: Payer: Self-pay | Admitting: Physician Assistant

## 2018-10-13 ENCOUNTER — Other Ambulatory Visit: Payer: Self-pay | Admitting: Family Medicine

## 2018-10-14 ENCOUNTER — Ambulatory Visit: Payer: Medicare Other

## 2018-10-14 NOTE — Telephone Encounter (Signed)
Patient is requesting a refill on gabapentin 300mg  TID, last refill 08/13/18 1 refill.  CYMBALTA last refilled on

## 2018-10-15 NOTE — Telephone Encounter (Signed)
Denied requests b/c too soon.

## 2018-10-22 ENCOUNTER — Ambulatory Visit: Payer: Medicare Other | Admitting: Nurse Practitioner

## 2018-10-22 ENCOUNTER — Ambulatory Visit (INDEPENDENT_AMBULATORY_CARE_PROVIDER_SITE_OTHER): Payer: Medicare Other

## 2018-10-22 DIAGNOSIS — E538 Deficiency of other specified B group vitamins: Secondary | ICD-10-CM

## 2018-10-22 MED ORDER — CYANOCOBALAMIN 1000 MCG/ML IJ SOLN
1000.0000 ug | Freq: Once | INTRAMUSCULAR | Status: AC
  Administered 2018-10-22: 1000 ug via INTRAMUSCULAR

## 2018-10-22 NOTE — Progress Notes (Signed)
Per orders of Dr. Gutierrez, injection of vit B12 given by Cecilio Ohlrich. Patient tolerated injection well.  

## 2018-10-28 ENCOUNTER — Telehealth: Payer: Self-pay | Admitting: Cardiovascular Disease

## 2018-10-28 NOTE — Telephone Encounter (Signed)

## 2018-10-29 ENCOUNTER — Ambulatory Visit (INDEPENDENT_AMBULATORY_CARE_PROVIDER_SITE_OTHER): Payer: Medicare Other | Admitting: Nurse Practitioner

## 2018-10-29 ENCOUNTER — Encounter: Payer: Self-pay | Admitting: Nurse Practitioner

## 2018-10-29 ENCOUNTER — Other Ambulatory Visit: Payer: Self-pay

## 2018-10-29 VITALS — BP 130/68 | HR 74 | Ht 62.0 in | Wt 138.8 lb

## 2018-10-29 DIAGNOSIS — I5032 Chronic diastolic (congestive) heart failure: Secondary | ICD-10-CM | POA: Diagnosis not present

## 2018-10-29 DIAGNOSIS — E782 Mixed hyperlipidemia: Secondary | ICD-10-CM

## 2018-10-29 DIAGNOSIS — I4819 Other persistent atrial fibrillation: Secondary | ICD-10-CM | POA: Diagnosis not present

## 2018-10-29 DIAGNOSIS — I10 Essential (primary) hypertension: Secondary | ICD-10-CM

## 2018-10-29 DIAGNOSIS — R0609 Other forms of dyspnea: Secondary | ICD-10-CM | POA: Diagnosis not present

## 2018-10-29 NOTE — Progress Notes (Signed)
Office Visit    Patient Name: [name] Date of Encounter: 10/29/2018  Primary Care Provider:  Ria Bush, MD Primary Cardiologist:  [name], MD  Chief Complaint    85 year old female with a history of persistent atrial fibrillation and flutter, chronic dyspnea on exertion, chronic fatigue, hypertension, hyperlipidemia, mild mitral regurgitation, and rheumatoid arthritis, who presents for follow-up of dyspnea after recent stress test.  Past Medical History    Past Medical History:  Diagnosis Date  . Abdominal aortic atherosclerosis ([place]) 09/2015   by xray  . Anxiety   . BCC (basal cell carcinoma of skin) 05/2011   on nose  . Breast lesion    a. diagnosed with complex sclerosing lesion with calcifications of the right breast in 08/2016  . Chest pain    a. nuclear stress test 03/2012: normal; b. 09/2018 MV: EF >65%, no isch/scar.   . DDD (degenerative disc disease), lumbosacral 12/06/1992  . Gallstones 09/2015   incidentally by xray  . GERD (gastroesophageal reflux disease) 05/1999  . HERPES ZOSTER 04/13/2007   Qualifier: Diagnosis of  By: [name], [name]   . Hiatal hernia   . History of shingles    ophthalmic, takes acyclovir daily  . Hyperlipidemia 12/2003  . Hypertension 1990  . Mitral regurgitation    a. echo 03/2012: EF 60-65%, no RWMA, mild to mod AI/MR, mod TR, PASP 37 mmHg  . Osteoarthritis 08/21/1989  . Osteoporosis with fracture 11/1997   with compression fracture T12  . Persistent atrial fibrillation 03/16/2012   a. CHADS2VASc => 5 (HTN, age x 2, vascular disease, sex category)-->Eliquis 5 BID;  b. Recurrent AF in 06/2016;  c. 01/2017 s/p DCCV;  d. 02/2017 recurrent AFib->Amio added->DCCV; e. 03/2017 recurrent AFib, s/p reload of amio and DCCV 04/02/2017  . Rheumatoid arthritis ([place])   . RLS (restless legs syndrome)    Past Surgical History:  Procedure Laterality Date  . ABDOMINAL HYSTERECTOMY  Age 48 - 30   S/P TAH  .  abdominal ultrasound  04/23/2004   Gallstones  . BREAST LUMPECTOMY WITH RADIOACTIVE SEED LOCALIZATION Right 10/2016   breast lumpectomy with radioactive seed localization and margin assessment for complex sclerosing lesion [facility])  . BREAST LUMPECTOMY WITH RADIOACTIVE SEED LOCALIZATION Right 11/05/2016   Procedure: RIGHT BREAST LUMPECTOMY WITH RADIOACTIVE SEED LOCALIZATION;  Surgeon: Fanny Skates, MD;  Location: [place];  Service: General;  Laterality: Right;  . CARDIOVERSION N/A 01/31/2017   Procedure: CARDIOVERSION;  Surgeon: Wellington Hampshire, MD;  Location: [facility];  Service: Cardiovascular;  Laterality: N/A;  . CARDIOVERSION N/A 03/03/2017   Procedure: CARDIOVERSION;  Surgeon: Wellington Hampshire, MD;  Location: [place] [facility];  Service: Cardiovascular;  Laterality: N/A;  . CARDIOVERSION N/A 04/02/2017   Procedure: CARDIOVERSION;  Surgeon: Wellington Hampshire, MD;  Location: [facility];  Service: Cardiovascular;  Laterality: N/A;  . CARDIOVERSION N/A 01/16/2018   Procedure: CARDIOVERSION;  Surgeon: Wellington Hampshire, MD;  Location: [facility];  Service: Cardiovascular;  Laterality: N/A;  . CATARACT EXTRACTION  05/2010   left eye  . CERVICAL DISCECTOMY  1992   Fusion (Dr. Joya Salm)  . ESI Bilateral 01/2016   S1 transforaminal ESI x3  . ESOPHAGOGASTRODUODENOSCOPY  01/01/2002   with ulcer, bx. neg; + stricture gastric ulcer with hemorrhage  . ESOPHAGOGASTRODUODENOSCOPY  04/23/2004   Esophageal stricture -- dilated  . nuclear stress test  03/2012   no ischemia  . SKIN CANCER EXCISION  05/20/2011   BCC from nose  .  US ECHOCARDIOGRAPHY  03/2012   in flutter, EF 60%, mild-mod aortic, mitral, tricuspid regurg, mildly dilated LA    Allergies  Allergies  Allergen Reactions  . Penicillins Rash and Other (See Comments)    Childhood Allergy Has patient had a PCN reaction causing immediate rash, facial/tongue/throat swelling, SOB or lightheadedness with hypotension: Yes Has patient  had a PCN reaction causing severe rash involving mucus membranes or skin necrosis: Unknown Has patient had a PCN reaction that required hospitalization: No Has patient had a PCN reaction occurring within the last 10 years: No If all of the above answers are "NO", then may proceed with Cephalosporin use.     History of Present Illness    85 year old female with the above past medical history including persistent atrial fibrillation and flutter on chronic amiodarone and Eliquis, chronic dyspnea on exertion, chronic fatigue, hypertension, hyperlipidemia, mild mitral irritation, and rheumatoid arthritis.  Prior stress testing December 2013 showed no evidence of ischemia.  Echocardiogram December 2019 showed normal LV systolic function, mild aortic and mitral regurgitation, moderately dilated eft atrium, and moderate pulmonary hypertension with a systolic pressure of 63 mmHg.  She was noted to have atrial flutter in October 2019 was scheduled for cardioversion but was noted be back in sinus rhythm on the day of the procedure.  As noted, she was maintained on amiodarone and Eliquis.  She was last seen in clinic on June 19, at which time she reported worsening of dyspnea on exertion and fatigue over a 1 month period.  Recent lab work was reviewed and unremarkable.  I set her up for a Gibson which was nonischemic w/ nl EF.  Since her last visit, she initially notes that she has been feeling great.  Upon further questioning, she does admit to ongoing DOE and fatigue, but overall, symptoms have been stable.  She denies c/p, palpitations, pnd, orthopnea, n, v, dizziness, syncope, or early satiety.  She does have chronic, mild ankle edema associated with discoloration of her lower ankles and feet, which has been chronic and dependent in nature.    Home Medications    Prior to Admission medications   Medication Sig Start Date End Date Taking? Authorizing Provider  acetaminophen (TYLENOL) 500 MG tablet  Take 1,000 mg every 8 (eight) hours as needed by mouth for moderate pain.     [provider]  acyclovir (ZOVIRAX) 400 MG tablet Take 400 mg 2 (two) times daily by mouth.     [provider]  amiodarone (PACERONE) 200 MG tablet TAKE ONE TABLET BY MOUTH EVERY DAY 04/14/18   Rise Mu, PA-C  atorvastatin (LIPITOR) 20 MG tablet TAKE ONE TABLET BY MOUTH EVERY DAY 07/10/18   Ria Bush, MD  cyanocobalamin (,VITAMIN B-12,) 1000 MCG/ML injection Inject 1 mL (1,000 mcg total) into the muscle once a week. 08/04/18   Ria Bush, MD  DULoxetine (CYMBALTA) 60 MG capsule TAKE 1 CAPSULE BY MOUTH EVERY DAY 07/10/18   Ria Bush, MD  ELIQUIS 2.5 MG TABS tablet TAKE ONE TABLET TWICE DAILY 08/04/18   Wellington Hampshire, MD  furosemide (LASIX) 20 MG tablet TAKE 1 TABLET BY MOUTH DAILY 10/14/18   Rise Mu, PA-C  gabapentin (NEURONTIN) 300 MG capsule Take 1 capsule (300 mg total) by mouth 3 (three) times daily. 08/13/18   Ria Bush, MD  metoprolol tartrate (LOPRESSOR) 25 MG tablet Take 0.5 tablets (12.5 mg total) by mouth 2 (two) times daily. 08/13/18   Wellington Hampshire, MD  pantoprazole (  PROTONIX) 40 MG tablet TAKE ONE TABLET BY MOUTH EVERY DAY 07/10/18   Ria Bush, MD  potassium chloride SA (K-DUR) 20 MEQ tablet Take 2 tablets (40 mEq total) by mouth daily. 09/28/18   Theora Gianotti, NP  prednisoLONE acetate (PRED FORTE) 1 % ophthalmic suspension Place 1 drop into the left eye daily.     [provider]  rOPINIRole (REQUIP) 1 MG tablet TAKE TWO TABLETS BY MOUTH AT BEDTIME 05/11/18   Ria Bush, MD  tiZANidine (ZANAFLEX) 2 MG tablet Take 2 mg by mouth every evening.  12/04/17   Ria Bush, MD  traMADol (ULTRAM) 50 MG tablet Take 1 tablet (50 mg total) by mouth 2 (two) times daily. 12/04/17   Ria Bush, MD  Vitamin D, Ergocalciferol, (DRISDOL) 1.25 MG (50000 UT) CAPS capsule TAKE 1 CAPSULE BY MOUTH EACH WEEK 09/16/18   Ria Bush, MD     Review of Systems    Chronic dyspnea and fatigue which has been unchanged.  Also chronic mild ankle edema.  She denies chest pain, palpitations, pnd, orthopnea, n, v, dizziness, syncope, weight gain, or early satiety.  All other systems reviewed and are otherwise negative except as noted above.  Physical Exam    VS:  BP 130/68 (BP Location: Left Arm, Patient Position: Sitting, Cuff Size: Normal)   Pulse 74   Ht 5\' 2"  (1.575 m)   Wt 138 lb 12.8 oz (63 kg)   SpO2 96%   BMI 25.39 kg/m  , BMI Body mass index is 25.39 kg/m. GEN: Well nourished, well developed, in no acute distress. HEENT: normal. Neck: Supple, no JVD, carotid bruits, or masses. Cardiac: RRR, no murmurs, rubs, or gallops. No clubbing, cyanosis, trace bimalleolar edema w/ rubor.  Radials/PT 2+ and equal bilaterally.  Respiratory:  Respirations regular and unlabored, clear to auscultation bilaterally. GI: Soft, nontender, nondistended, BS + x 4. MS: no deformity or atrophy. Skin: warm and dry, no rash. Neuro:  Strength and sensation are intact. Psych: Normal affect.  Accessory Clinical Findings    ECG personally reviewed by me today -regular sinus rhythm, 61, nonspecific ST and T changes - no acute changes.  Lab Results  Component Value Date   WBC 6.3 09/25/2018   HGB 14.8 09/25/2018   HCT 43.7 09/25/2018   MCV 96.5 09/25/2018   PLT 247 09/25/2018   Lab Results  Component Value Date   CREATININE 1.01 (H) 09/25/2018   BUN 19 09/25/2018   NA 140 09/25/2018   K 3.4 (L) 09/25/2018   CL 102 09/25/2018   CO2 26 09/25/2018   Lab Results  Component Value Date   ALT 22 09/25/2018   AST 21 09/25/2018   ALKPHOS 55 09/25/2018   BILITOT 0.8 09/25/2018   Lab Results  Component Value Date   CHOL 135 02/11/2018   HDL 59.20 02/11/2018   LDLCALC 53 02/11/2018   LDLDIRECT 173.1 02/19/2010   TRIG 113.0 02/11/2018   CHOLHDL 2 02/11/2018     Assessment & Plan    1.  Chronic dyspnea and fatigue/HFpEF/pulmonary  hypertension: This is a long history of this with echocardiogram in December 2019 showing normal LV function and pulmonary hypertension.  Dyspnea has been relatively stable.  Recent negative Myoview.  She has mild lower extremity edema in the setting of chronic venous stasis but is otherwise euvolemic.  Heart rate and blood pressure stable.  Continue current regimen.  2.  History of persistent atypical atrial flutter/A. fib: Maintaining sinus rhythm on amiodarone and  beta-blocker.  She remains anticoagulated on Eliquis.  3.  Essential hypertension: Stable.  4.  Hyperlipidemia: LDL of 53 November 2019 with normal LFTs in June of this year.  She remains on atorvastatin.  5.  Disposition: Follow-up with Dr. Fletcher Anon in 6 months or sooner if necessary.  Murray Hodgkins, NP 10/29/2018, 5:40 PM

## 2018-10-29 NOTE — Patient Instructions (Signed)
Medication Instructions:  Your physician recommends that you continue on your current medications as directed. Please refer to the Current Medication list given to you today.  If you need a refill on your cardiac medications before your next appointment, please call your pharmacy.   Lab work: None ordered  If you have labs (blood work) drawn today and your tests are completely normal, you will receive your results only by: Marland Kitchen MyChart Message (if you have MyChart) OR . A paper copy in the mail If you have any lab test that is abnormal or we need to change your treatment, we will call you to review the results.  Testing/Procedures: None ordered   Follow-Up: At [facility], you and your health needs are our priority.  As part of our continuing mission to provide you with exceptional heart care, we have created designated Provider Care Teams.  These Care Teams include your primary Cardiologist (physician) and Advanced Practice Providers (APPs -  Physician Assistants and Nurse Practitioners) who all work together to provide you with the care you need, when you need it. You will need a follow up appointment in 6 months.  Please call our office 2 months in advance to schedule this appointment.  You may see [name], MD or Murray Hodgkins, NP.

## 2018-11-12 ENCOUNTER — Other Ambulatory Visit: Payer: Self-pay | Admitting: Family Medicine

## 2018-11-12 NOTE — Telephone Encounter (Signed)
Gabapentin Last filled:  10/13/18, #[id number] Last OV:  08/03/18, f/u Next OV:  04/28/19, CPE Pt 2

## 2018-11-23 ENCOUNTER — Encounter: Payer: Self-pay | Admitting: Family Medicine

## 2018-11-23 ENCOUNTER — Ambulatory Visit (INDEPENDENT_AMBULATORY_CARE_PROVIDER_SITE_OTHER): Payer: Medicare Other | Admitting: Family Medicine

## 2018-11-23 ENCOUNTER — Other Ambulatory Visit: Payer: Self-pay

## 2018-11-23 VITALS — BP 140/82 | HR 67 | Temp 97.9°F | Ht 62.5 in | Wt 141.2 lb

## 2018-11-23 DIAGNOSIS — I7389 Other specified peripheral vascular diseases: Secondary | ICD-10-CM

## 2018-11-23 DIAGNOSIS — K649 Unspecified hemorrhoids: Secondary | ICD-10-CM

## 2018-11-23 DIAGNOSIS — M79604 Pain in right leg: Secondary | ICD-10-CM

## 2018-11-23 DIAGNOSIS — G6289 Other specified polyneuropathies: Secondary | ICD-10-CM

## 2018-11-23 DIAGNOSIS — E538 Deficiency of other specified B group vitamins: Secondary | ICD-10-CM

## 2018-11-23 DIAGNOSIS — G2581 Restless legs syndrome: Secondary | ICD-10-CM

## 2018-11-23 DIAGNOSIS — M79605 Pain in left leg: Secondary | ICD-10-CM

## 2018-11-23 DIAGNOSIS — M79672 Pain in left foot: Secondary | ICD-10-CM

## 2018-11-23 DIAGNOSIS — M79671 Pain in right foot: Secondary | ICD-10-CM

## 2018-11-23 MED ORDER — HYDROCORTISONE (PERIANAL) 2.5 % EX CREA: 1.0000 "application " | TOPICAL_CREAM | Freq: Two times a day (BID) | CUTANEOUS | 0 refills | Status: DC

## 2018-11-23 MED ORDER — CYANOCOBALAMIN 1000 MCG/ML IJ SOLN
1000.0000 ug | Freq: Once | INTRAMUSCULAR | Status: AC
  Administered 2018-11-23: 1000 ug via INTRAMUSCULAR

## 2018-11-23 MED ORDER — HYDROCORTISONE ACETATE 25 MG RE SUPP: 25.0000 mg | Freq: Two times a day (BID) | RECTAL | 0 refills | Status: DC

## 2018-11-23 NOTE — Progress Notes (Signed)
This visit was conducted in person.  BP 140/82 (BP Location: Right Arm, Patient Position: Sitting, Cuff Size: Normal)   Pulse 67   Temp 97.9 F (36.6 C) (Temporal)   Ht 5' 2.5" (1.588 m)   Wt 141 lb 3 oz (64 kg)   SpO2 96%   BMI 25.41 kg/m    CC: burning of feet Subjective:    Patient ID: [name], female    DOB: 01/05/1930, 85 y.o.   MRN: [id number]  HPI: [name] is a 85 y.o. female presenting on 11/23/2018 for Foot Pain (C/o burning sensation in feet and swelling in bilateral feet. Seen for similar sxs previously. Worsened in last 3 wks.  Painful to walk at times. )   Worsening foot pain and swelling R>L over 3 wks in h/o same previously attributed to neuropathy despite gabapentin 300mg  tid. She keeps her legs elevated most of the day.   Continues monthly B12 shots, latest 10/23/2018. Due for shot today.   Trouble with hemorrhoid despite daily miralax.      Relevant past medical, surgical, family and social history reviewed and updated as indicated. Interim medical history since our last visit reviewed. Allergies and medications reviewed and updated. Outpatient Medications Prior to Visit  Medication Sig Dispense Refill  . acetaminophen (TYLENOL) 500 MG tablet Take 1,000 mg every 8 (eight) hours as needed by mouth for moderate pain.     Marland Kitchen acyclovir (ZOVIRAX) 400 MG tablet Take 400 mg 2 (two) times daily by mouth.     Marland Kitchen amiodarone (PACERONE) 200 MG tablet TAKE ONE TABLET BY MOUTH EVERY DAY 90 tablet 3  . atorvastatin (LIPITOR) 20 MG tablet TAKE ONE TABLET BY MOUTH EVERY DAY 90 tablet 2  . cyanocobalamin (,VITAMIN B-12,) 1000 MCG/ML injection Inject 1 mL (1,000 mcg total) into the muscle once a week.    . DULoxetine (CYMBALTA) 60 MG capsule TAKE 1 CAPSULE BY MOUTH EVERY DAY 90 capsule 1  . ELIQUIS 2.5 MG TABS tablet TAKE ONE TABLET TWICE DAILY 180 tablet 1  . furosemide (LASIX) 20 MG tablet TAKE 1 TABLET BY MOUTH DAILY 90 tablet 0  . gabapentin (NEURONTIN) 300 MG  capsule TAKE 1 CAPSULE BY MOUTH THREE TIMES DAILY 270 capsule 1  . metoprolol tartrate (LOPRESSOR) 25 MG tablet Take 0.5 tablets (12.5 mg total) by mouth 2 (two) times daily. 90 tablet 2  . pantoprazole (PROTONIX) 40 MG tablet TAKE ONE TABLET BY MOUTH EVERY DAY 90 tablet 2  . potassium chloride SA (K-DUR) 20 MEQ tablet Take 2 tablets (40 mEq total) by mouth daily. 180 tablet 1  . prednisoLONE acetate (PRED FORTE) 1 % ophthalmic suspension Place 1 drop into the left eye daily.     Marland Kitchen rOPINIRole (REQUIP) 1 MG tablet TAKE TWO TABLETS BY MOUTH AT BEDTIME 180 tablet 3  . tiZANidine (ZANAFLEX) 2 MG tablet Take 2 mg by mouth every evening.     . traMADol (ULTRAM) 50 MG tablet Take 1 tablet (50 mg total) by mouth 2 (two) times daily.  0  . Vitamin D, Ergocalciferol, (DRISDOL) 1.25 MG (50000 UT) CAPS capsule TAKE 1 CAPSULE BY MOUTH EACH WEEK 12 capsule 0   No facility-administered medications prior to visit.      Per HPI unless specifically indicated in ROS section below Review of Systems Objective:    BP 140/82 (BP Location: Right Arm, Patient Position: Sitting, Cuff Size: Normal)   Pulse 67   Temp 97.9 F (36.6 C) (Temporal)  Ht 5' 2.5" (1.588 m)   Wt 141 lb 3 oz (64 kg)   SpO2 96%   BMI 25.41 kg/m   Wt Readings from Last 3 Encounters:  11/23/18 141 lb 3 oz (64 kg)  10/29/18 138 lb 12.8 oz (63 kg)  09/25/18 132 lb 1.9 oz (59.9 kg)    Physical Exam Vitals signs and nursing note reviewed.  Constitutional:      Appearance: Normal appearance. She is ill-appearing.     Comments: Slowed gait with walker  Genitourinary:    Rectum: Internal hemorrhoid (small, non inflamed) present.  Musculoskeletal:     Comments: Diminished pulses bilaterally, tender nonpitting pedal edema bilaterally  Skin:    General: Skin is cool.     Capillary Refill: Capillary refill takes 2 to 3 seconds.     Coloration: Skin is mottled.          Comments: Purplish discoloration to skin of bilateral feet with  delayed capillary refill without skin breakdown  Neurological:     Comments: Slowed gait due to pain  Psychiatric:        Mood and Affect: Mood normal.        Behavior: Behavior normal.        Assessment & Plan:   Problem List Items Addressed This Visit    Vitamin B12 deficiency    B12 shot today.       RLS (restless legs syndrome)   Peripheral neuropathy   Relevant Orders   Ambulatory referral to Neurology   Hemorrhoid    Try anusol suppository      Bilateral leg and foot pain - Primary    Symptoms more consistent with neuropathy than claudication. She has delayed capillary refill on exam today as well as diminished pulses. She had normal ABIs with noncompressible vessels bilaterally (?false negative) and abnormal TBIs 01/2018. She was found to have low B12 levels several months ago s/p weekly replacement now on monthly schedule without any benefit to foot symptoms. Ongoing and worsening foot pains despite increased gabapentin dose.  Will refer back to cardiology for arterial evaluation as well as neurology for further neuropathy evaluation. ?acrocyanosis - CBC was normal 09/2018.       Relevant Orders   Ambulatory referral to Neurology   Ambulatory referral to Cardiology    Other Visit Diagnoses    Acrocyanosis [facility])       Relevant Orders   Ambulatory referral to Neurology   Ambulatory referral to Cardiology       Meds ordered this encounter  Medications  . DISCONTD: hydrocortisone (ANUSOL-HC) 2.5 % rectal cream    Sig: Place 1 application rectally 2 (two) times daily.    Dispense:  30 g    Refill:  0  . hydrocortisone (ANUSOL-HC) 25 MG suppository    Sig: Place 1 suppository (25 mg total) rectally 2 (two) times daily.    Dispense:  12 suppository    Refill:  0  . cyanocobalamin ((VITAMIN B-12)) injection 1,000 mcg   Orders Placed This Encounter  Procedures  . Ambulatory referral to Neurology    Referral Priority:   Routine    Referral Type:   Consultation     Referral Reason:   Specialty Services Required    Requested Specialty:   Neurology    Number of Visits Requested:   1  . Ambulatory referral to Cardiology    Referral Priority:   Routine    Referral Type:   Consultation    Referral Reason:  Specialty Services Required    Requested Specialty:   Cardiology    Number of Visits Requested:   1    Follow up plan: Return in about 4 weeks (around 12/21/2018) for follow up visit.  Ria Bush, MD

## 2018-11-23 NOTE — Patient Instructions (Addendum)
B12 shot today We will refer you back to Dr Fletcher Anon for further evaluation of circulation as well as to neurology for further evaluation of neuropathy.  Try anusol suppositories for hemorrhoid.

## 2018-11-25 DIAGNOSIS — K649 Unspecified hemorrhoids: Secondary | ICD-10-CM | POA: Insufficient documentation

## 2018-11-25 NOTE — Assessment & Plan Note (Signed)
Try anusol suppository

## 2018-11-25 NOTE — Assessment & Plan Note (Signed)
B12 shot today. 

## 2018-11-25 NOTE — Assessment & Plan Note (Addendum)
Symptoms more consistent with neuropathy than claudication. She has delayed capillary refill on exam today as well as diminished pulses. She had normal ABIs with noncompressible vessels bilaterally (?false negative) and abnormal TBIs 01/2018. She was found to have low B12 levels several months ago s/p weekly replacement now on monthly schedule without any benefit to foot symptoms. Ongoing and worsening foot pains despite increased gabapentin dose.  Will refer back to cardiology for arterial evaluation as well as neurology for further neuropathy evaluation. ?acrocyanosis - CBC was normal 09/2018.

## 2018-11-27 ENCOUNTER — Telehealth: Payer: Self-pay

## 2018-11-27 DIAGNOSIS — M5136 Other intervertebral disc degeneration, lumbar region: Secondary | ICD-10-CM | POA: Diagnosis not present

## 2018-11-27 DIAGNOSIS — M48062 Spinal stenosis, lumbar region with neurogenic claudication: Secondary | ICD-10-CM | POA: Diagnosis not present

## 2018-11-27 DIAGNOSIS — M5416 Radiculopathy, lumbar region: Secondary | ICD-10-CM | POA: Diagnosis not present

## 2018-11-27 DIAGNOSIS — M62830 Muscle spasm of back: Secondary | ICD-10-CM | POA: Diagnosis not present

## 2018-11-27 NOTE — Telephone Encounter (Signed)
Received faxed PA form for Hydrocortisone suppository.   Placed form in Dr. Synthia Innocent box.

## 2018-11-30 ENCOUNTER — Telehealth: Payer: Self-pay | Admitting: *Deleted

## 2018-11-30 ENCOUNTER — Encounter: Payer: Self-pay | Admitting: Nurse Practitioner

## 2018-11-30 ENCOUNTER — Ambulatory Visit (INDEPENDENT_AMBULATORY_CARE_PROVIDER_SITE_OTHER): Payer: Medicare Other | Admitting: Nurse Practitioner

## 2018-11-30 ENCOUNTER — Other Ambulatory Visit: Payer: Self-pay

## 2018-11-30 VITALS — BP 132/72 | HR 74 | Ht 62.0 in | Wt 155.0 lb

## 2018-11-30 DIAGNOSIS — I4819 Other persistent atrial fibrillation: Secondary | ICD-10-CM | POA: Diagnosis not present

## 2018-11-30 DIAGNOSIS — I10 Essential (primary) hypertension: Secondary | ICD-10-CM | POA: Diagnosis not present

## 2018-11-30 DIAGNOSIS — I2721 Secondary pulmonary arterial hypertension: Secondary | ICD-10-CM | POA: Diagnosis not present

## 2018-11-30 DIAGNOSIS — M79671 Pain in right foot: Secondary | ICD-10-CM

## 2018-11-30 DIAGNOSIS — E782 Mixed hyperlipidemia: Secondary | ICD-10-CM

## 2018-11-30 DIAGNOSIS — I5032 Chronic diastolic (congestive) heart failure: Secondary | ICD-10-CM | POA: Diagnosis not present

## 2018-11-30 DIAGNOSIS — M79672 Pain in left foot: Secondary | ICD-10-CM

## 2018-11-30 NOTE — Progress Notes (Signed)
Office Visit    Patient Name: [name] Date of Encounter: 11/30/2018  Primary Care Provider:  Ria Bush, MD Primary Cardiologist:  [name], MD  Chief Complaint    85 year old female with a history of persistent atrial fibrillation and flutter, chronic dyspnea on exertion, chronic fatigue, hypertension, hyperlipidemia, mild mitral vegetation, and rheumatoid arthritis, who presents for follow-up of lower extremity swelling and pain.  Past Medical History    Past Medical History:  Diagnosis Date   Abdominal aortic atherosclerosis ([place]) 09/2015   by xray   Anxiety    BCC (basal cell carcinoma of skin) 05/2011   on nose   Breast lesion    a. diagnosed with complex sclerosing lesion with calcifications of the right breast in 08/2016   Chest pain    a. nuclear stress test 03/2012: normal; b. 09/2018 MV: EF >65%, no isch/scar.    DDD (degenerative disc disease), lumbosacral 12/06/1992   Gallstones 09/2015   incidentally by xray   GERD (gastroesophageal reflux disease) 05/1999   HERPES ZOSTER 04/13/2007   Qualifier: Diagnosis of  By: [name], [name]    Hiatal hernia    History of shingles    ophthalmic, takes acyclovir daily   Hyperlipidemia 12/2003   Hypertension 1990   Mitral regurgitation    a. echo 03/2012: EF 60-65%, no RWMA, mild to mod AI/MR, mod TR, PASP 37 mmHg   Osteoarthritis 08/21/1989   Osteoporosis with fracture 11/1997   with compression fracture T12   Persistent atrial fibrillation 03/16/2012   a. CHADS2VASc => 5 (HTN, age x 2, vascular disease, sex category)-->Eliquis 5 BID;  b. Recurrent AF in 06/2016;  c. 01/2017 s/p DCCV;  d. 02/2017 recurrent AFib->Amio added->DCCV; e. 03/2017 recurrent AFib, s/p reload of amio and DCCV 04/02/2017   Rheumatoid arthritis ([name])    RLS (restless legs syndrome)    Past Surgical History:  Procedure Laterality Date   ABDOMINAL HYSTERECTOMY  Age 9 - 31   S/P TAH    abdominal ultrasound  04/23/2004   Gallstones   BREAST LUMPECTOMY WITH RADIOACTIVE SEED LOCALIZATION Right 10/2016   breast lumpectomy with radioactive seed localization and margin assessment for complex sclerosing lesion ([place])   BREAST LUMPECTOMY WITH RADIOACTIVE SEED LOCALIZATION Right 11/05/2016   Procedure: RIGHT BREAST LUMPECTOMY WITH RADIOACTIVE SEED LOCALIZATION;  Surgeon: Fanny Skates, MD;  Location: [facility];  Service: General;  Laterality: Right;   CARDIOVERSION N/A 01/31/2017   Procedure: CARDIOVERSION;  Surgeon: Wellington Hampshire, MD;  Location: [facility];  Service: Cardiovascular;  Laterality: N/A;   CARDIOVERSION N/A 03/03/2017   Procedure: CARDIOVERSION;  Surgeon: Wellington Hampshire, MD;  Location: [facility];  Service: Cardiovascular;  Laterality: N/A;   CARDIOVERSION N/A 04/02/2017   Procedure: CARDIOVERSION;  Surgeon: Wellington Hampshire, MD;  Location: [facility];  Service: Cardiovascular;  Laterality: N/A;   CARDIOVERSION N/A 01/16/2018   Procedure: CARDIOVERSION;  Surgeon: Wellington Hampshire, MD;  Location: [facility];  Service: Cardiovascular;  Laterality: N/A;   CATARACT EXTRACTION  05/2010   left eye   CERVICAL DISCECTOMY  1992   Fusion (Dr. Joya Salm)   ESI Bilateral 01/2016   S1 transforaminal ESI x3   ESOPHAGOGASTRODUODENOSCOPY  01/01/2002   with ulcer, bx. neg; + stricture gastric ulcer with hemorrhage   ESOPHAGOGASTRODUODENOSCOPY  04/23/2004   Esophageal stricture -- dilated   nuclear stress test  03/2012   no ischemia   SKIN CANCER EXCISION  05/20/2011   BCC from nose  US ECHOCARDIOGRAPHY  03/2012   in flutter, EF 60%, mild-mod aortic, mitral, tricuspid regurg, mildly dilated LA    Allergies  Allergies  Allergen Reactions   Penicillins Rash and Other (See Comments)    Childhood Allergy Has patient had a PCN reaction causing immediate rash, facial/tongue/throat swelling, SOB or lightheadedness with hypotension: Yes Has patient  had a PCN reaction causing severe rash involving mucus membranes or skin necrosis: Unknown Has patient had a PCN reaction that required hospitalization: No Has patient had a PCN reaction occurring within the last 10 years: No If all of the above answers are "NO", then may proceed with Cephalosporin use.     History of Present Illness    85 year old female with the above past medical history including persistent atrial fibrillation and flutter on chronic amiodarone and Eliquis, chronic dyspnea on exertion, chronic fatigue, hypertension, hyperlipidemia, mild mitral regurgitation, and rheumatoid arthritis.  Prior stress testing in December 2013 showed no evidence of ischemia.  Echocardiogram December 2019 showed normal LV function, mild aortic and mitral regurgitation, moderately dilated left atrium, and moderate pulmonary hypertension with a systolic blood pressure of 63 mmHg.  She was noted to have atrial flutter in October 2019 and was scheduled for cardioversion but was noted to be back in sinus rhythm on the day of the procedure.  In June of this year, she reported worsening dyspnea on exertion and fatigue over 1 month period.  Prior lab work was unremarkable.  She underwent Lexiscan Myoview, which was nonischemic with normal LV function.  When she was seen back in clinic on July 23, she continued to report fatigue but was overall stable.  Plan was for follow-up in 6 months.  She was seen by primary care last week in the setting of bilateral foot and leg pain.  She has chronic mild lower extremity edema with tenderness of her lower extremities along with chronic discoloration and erythema.  She says that over a several week period, she was having worsening lower extremity edema and this was associated with pain in her feet simply with the act of standing.  Pain was constant in nature and seem to be exacerbated by swelling.  In the setting, she was referred back to cardiology and also referred to  neurology, as much of her pain has been felt to be secondary to neuropathy.  She was seen by physical medicine on August 21 and was advised that she might benefit from reducing her gabapentin dose from 300 mg 3 times daily to 300 mg daily.  She took this advice and since doing so, she has had significant reduction in lower extremity edema and no pain in her feet at rest.  She does have pain when she stands up initially but this is markedly improved compared to what she had been experiencing for the past 3 weeks.  She denies lower extremity claudication though notes that she does not ambulate very much.  In discussing her lower extremity edema, she says that she frequently eats convenient meals such as [employer], which she can heat and eat very quickly.  She is aware that these are processed and very salty.  She might have 1 of these meals up to 3-4 times per week.  She does not routinely weigh herself at home.  She has chronic fatigue and some degree of dyspnea on exertion.  She denies chest pain, palpitations, PND, orthopnea, dizziness, syncope, or early satiety.  Home Medications    Prior to Admission medications   Medication  Sig Start Date End Date Taking? Authorizing Provider  acetaminophen (TYLENOL) 500 MG tablet Take 1,000 mg every 8 (eight) hours as needed by mouth for moderate pain.    Yes [provider]  acyclovir (ZOVIRAX) 400 MG tablet Take 400 mg 2 (two) times daily by mouth.    Yes [provider]  amiodarone (PACERONE) 200 MG tablet TAKE ONE TABLET BY MOUTH EVERY DAY 04/14/18  Yes Dunn, Areta Haber, PA-C  atorvastatin (LIPITOR) 20 MG tablet TAKE ONE TABLET BY MOUTH EVERY DAY 07/10/18  Yes Ria Bush, MD  cyanocobalamin (,VITAMIN B-12,) 1000 MCG/ML injection Inject 1 mL (1,000 mcg total) into the muscle once a week. 08/04/18  Yes Ria Bush, MD  DULoxetine (CYMBALTA) 60 MG capsule TAKE 1 CAPSULE BY MOUTH EVERY DAY 11/12/18  Yes Ria Bush, MD  ELIQUIS 2.5 MG TABS  tablet TAKE ONE TABLET TWICE DAILY 08/04/18  Yes Wellington Hampshire, MD  furosemide (LASIX) 20 MG tablet TAKE 1 TABLET BY MOUTH DAILY 10/14/18  Yes Dunn, Areta Haber, PA-C  gabapentin (NEURONTIN) 300 MG capsule TAKE 1 CAPSULE BY MOUTH THREE TIMES DAILY 11/13/18  Yes Ria Bush, MD  hydrocortisone (ANUSOL-HC) 25 MG suppository Place 1 suppository (25 mg total) rectally 2 (two) times daily. 11/23/18  Yes Ria Bush, MD  metoprolol tartrate (LOPRESSOR) 25 MG tablet Take 0.5 tablets (12.5 mg total) by mouth 2 (two) times daily. 08/13/18  Yes Wellington Hampshire, MD  pantoprazole (PROTONIX) 40 MG tablet TAKE ONE TABLET BY MOUTH EVERY DAY 07/10/18  Yes Ria Bush, MD  potassium chloride SA (K-DUR) 20 MEQ tablet Take 2 tablets (40 mEq total) by mouth daily. 09/28/18  Yes Theora Gianotti, NP  prednisoLONE acetate (PRED FORTE) 1 % ophthalmic suspension Place 1 drop into the left eye daily.    Yes [provider]  rOPINIRole (REQUIP) 1 MG tablet TAKE TWO TABLETS BY MOUTH AT BEDTIME 05/11/18  Yes Ria Bush, MD  tiZANidine (ZANAFLEX) 2 MG tablet Take 2 mg by mouth every evening.  12/04/17  Yes Ria Bush, MD  traMADol (ULTRAM) 50 MG tablet Take 1 tablet (50 mg total) by mouth 2 (two) times daily. 12/04/17  Yes Ria Bush, MD  Vitamin D, Ergocalciferol, (DRISDOL) 1.25 MG (50000 UT) CAPS capsule TAKE 1 CAPSULE BY MOUTH EACH WEEK 11/12/18  Yes Ria Bush, MD    Review of Systems    Lower extremity swelling and pain with discoloration of her feet.  Pain and swelling have improved significantly since reducing gabapentin dose the other day.  She also has a chronic fatigue and some degree of dyspnea on exertion.  She denies chest pain, palpitations, PND, orthopnea, dizziness, syncope, or early satiety.  All other systems reviewed and are otherwise negative except as noted above.  Physical Exam    VS:  BP 132/72 (BP Location: Left Arm, Patient Position: Sitting, Cuff Size:  Normal)    Pulse 74    Ht 5\' 2"  (1.575 m)    Wt 155 lb (70.3 kg)    BMI 28.35 kg/m  , BMI Body mass index is 28.35 kg/m. GEN: Well nourished, well developed, in no acute distress. HEENT: normal. Neck: Supple, no JVD, carotid bruits, or masses. Cardiac: RRR, 2/6 systolic ejection murmur heard throughout, no rubs, or gallops. No clubbing, cyanosis.  Rubor to bilateral feet and ankles with 1+ bimalleolar edema.  DP/PT 1+ and equal bilaterally.  Respiratory:  Respirations regular and unlabored, clear to auscultation bilaterally. GI: Soft, nontender, nondistended, BS + x  4. MS: no deformity or atrophy. Skin: warm and dry, no rash. Neuro:  Strength and sensation are intact. Psych: Normal affect.  Accessory Clinical Findings    ECG personally reviewed by me today -regular sinus rhythm, 62, baseline artifact - no acute changes.  Lab Results  Component Value Date   WBC 6.3 09/25/2018   HGB 14.8 09/25/2018   HCT 43.7 09/25/2018   MCV 96.5 09/25/2018   PLT 247 09/25/2018   Lab Results  Component Value Date   CREATININE 1.01 (H) 09/25/2018   BUN 19 09/25/2018   NA 140 09/25/2018   K 3.4 (L) 09/25/2018   CL 102 09/25/2018   CO2 26 09/25/2018   Lab Results  Component Value Date   ALT 22 09/25/2018   AST 21 09/25/2018   ALKPHOS 55 09/25/2018   BILITOT 0.8 09/25/2018   Lab Results  Component Value Date   CHOL 135 02/11/2018   HDL 59.20 02/11/2018   LDLCALC 53 02/11/2018   LDLDIRECT 173.1 02/19/2010   TRIG 113.0 02/11/2018   CHOLHDL 2 02/11/2018     Assessment & Plan    1.  Bilateral foot pain: Likely multifactorial in the setting of chronic foot pain/peripheral neuropathy and exacerbated by recent worsening of lower extremity edema.  For 3-week period, she had rather severe foot pain that was constant in nature and worse with palpation or standing.  She has previously been evaluated with ABIs, which were normal (though TBI's were abnormal), and her symptoms are not consistent  with claudication.  Symptoms improved markedly after reducing her gabapentin from 300 mg 3 times daily to 300 mg daily.  She has trace to 1+ ankle edema today.  Her ankles and feet remain tender to touch and she has weak distal pulses with sluggish capillary refill, which has been noted before.  She will plan to stay on 300 mg of gabapentin daily for the time being and will follow up with primary care and it sounds as though a neurology referral has also been placed.  We discussed at length how her lower extremity edema seem to really exacerbate her symptoms and what we could do to potentially prevent that going forward.  She admits to frequent salty meals and processed foods during the week.  She does not routinely weigh herself and is not confident in standing on a scale given peripheral neuropathy.  She currently takes Lasix 20 mg daily.  I recommended that if she has an increase in lower extremity swelling from 1 day to the next, she may take an additional Lasix that day.  I advised that if she is doing this more than 2-3 times per week, she will need to tell us so that we may follow-up a basic metabolic panel.  She was agreeable with this plan.  2.  Chronic dyspnea and fatigue/HFpEF/pulmonary hypertension: This is longstanding.  Echo December 2019 showed normal LV function and pulmonary hypertension.  Dyspnea has been stable.  Recent negative Myoview.  Suspect pulmonary hypertension may be playing a role in chronic lower extremity swelling which is exacerbated by salty meals.  See #1.  3.  Essential hypertension: Stable.  4.  Hyperlipidemia: LDL 50 08 February 2018 with normal LFTs in June of this year.  She remains on atorvastatin therapy.  5.  Persistent atrial fibrillation: Maintaining sinus on amiodarone.  She is anticoagulated with Eliquis.    6.  Disposition: Follow-up with Dr. Fletcher Anon in 6 months or sooner if necessary.  Murray Hodgkins, NP 11/30/2018,  5:26 PM

## 2018-11-30 NOTE — Telephone Encounter (Signed)
Filled and in [name]'s box 

## 2018-11-30 NOTE — Telephone Encounter (Signed)
Patient's daughter left a voicemail stating that her mom came in for an office visit last week. [name] stated that she was not able to come with her for that visit. [name] stated that she saw her mom yesterday and her feet are not doing any bette and she is afraid that her skin is going to split. Patient's daughter stated that the after visit notes show that patient is to follow-up with her cardiologist and see a neurologist. [name] stated that she is not sure if she has to schedule the appointments or if our office is to do that? [name] stated that her mom saw her back doctor last week and he told them to back her Gabapentin down to 300 mg a day instead of 900 mg a day because that can cause some swelling. Patient's daughter wants to make sure that Dr. Danise Mina feels that is the right thing to do. Patient's daughter requested a call back about the referrals and other concerns that she has.

## 2018-11-30 NOTE — Patient Instructions (Signed)
Medication Instructions:  1- Lasix- you may take 1 extra lasix tablet (20 mg total) as needed PRN for ankle/foot swelling. Max 3 doses per week. If taking frequently, please call clinic for follow up labs.   If you need a refill on your cardiac medications before your next appointment, please call your pharmacy.   Lab work: None ordered  If you have labs (blood work) drawn today and your tests are completely normal, you will receive your results only by: Marland Kitchen MyChart Message (if you have MyChart) OR . A paper copy in the mail If you have any lab test that is abnormal or we need to change your treatment, we will call you to review the results.  Testing/Procedures: None ordered   Follow-Up: At [facility], you and your health needs are our priority.  As part of our continuing mission to provide you with exceptional heart care, we have created designated Provider Care Teams.  These Care Teams include your primary Cardiologist (physician) and Advanced Practice Providers (APPs -  Physician Assistants and Nurse Practitioners) who all work together to provide you with the care you need, when you need it. You will need a follow up appointment in 6 months.  Please call our office 2 months in advance to schedule this appointment.  You may see [name], MD or Murray Hodgkins, NP.

## 2018-11-30 NOTE — Telephone Encounter (Signed)
Spoke with daughter. Leg swelling has improved on lower gabapentin dosing - will continue 300mg  nightly.  She did see cardiology today as well

## 2018-11-30 NOTE — Telephone Encounter (Signed)
Faxed form.  Decision pending.  

## 2018-12-04 ENCOUNTER — Other Ambulatory Visit: Payer: Self-pay | Admitting: Family Medicine

## 2018-12-04 NOTE — Telephone Encounter (Signed)
plz notify pt that hydrocortisone suppositories were denied by insurance. May need to pay out of pocket if affordable.

## 2018-12-04 NOTE — Telephone Encounter (Signed)
Spoke with pt relaying Dr. G's message. Pt verbalizes understanding.  

## 2018-12-04 NOTE — Telephone Encounter (Addendum)
Received faxed denial stating certain are excluded from Medicare coverage when used to treat certain medical conditions. Hydrocortisone Acetate is one of the excluded drugs. Fyi to Dr. Darnell Level.

## 2018-12-21 ENCOUNTER — Telehealth: Payer: Self-pay | Admitting: Cardiovascular Disease

## 2018-12-21 NOTE — Telephone Encounter (Signed)
Patient notified samples available of Eliquis 2.5 mg.  Medication Samples have been provided to the patient.  Drug name: Eliquis    Strength: 2.5 mg  Qty: 2 boxes (28 tablets)  LOT: [id number]  Exp.Date: 05/2019  [name] 12:13 PM 12/21/2018

## 2018-12-21 NOTE — Telephone Encounter (Signed)
Patient calling the office for samples of medication:   1.  What medication and dosage are you requesting samples for? Eliquis  2.5 po BID    2.  Are you currently out of this medication?   No but will be in 2 weeks .  Now in donut hole and meds are not affordable .

## 2018-12-29 DIAGNOSIS — R202 Paresthesia of skin: Secondary | ICD-10-CM | POA: Insufficient documentation

## 2018-12-29 DIAGNOSIS — R200 Anesthesia of skin: Secondary | ICD-10-CM | POA: Diagnosis not present

## 2019-01-07 HISTORY — PX: PERIPHERAL VASCULAR BALLOON ANGIOPLASTY: CATH118281

## 2019-01-12 ENCOUNTER — Ambulatory Visit (INDEPENDENT_AMBULATORY_CARE_PROVIDER_SITE_OTHER): Payer: Medicare Other | Admitting: Vascular Surgery

## 2019-01-12 ENCOUNTER — Encounter (INDEPENDENT_AMBULATORY_CARE_PROVIDER_SITE_OTHER): Payer: Self-pay | Admitting: Vascular Surgery

## 2019-01-12 ENCOUNTER — Other Ambulatory Visit: Payer: Self-pay

## 2019-01-12 VITALS — BP 202/74 | HR 70 | Resp 12 | Ht 62.0 in | Wt 139.0 lb

## 2019-01-12 DIAGNOSIS — M79671 Pain in right foot: Secondary | ICD-10-CM

## 2019-01-12 DIAGNOSIS — I10 Essential (primary) hypertension: Secondary | ICD-10-CM

## 2019-01-12 DIAGNOSIS — M79605 Pain in left leg: Secondary | ICD-10-CM

## 2019-01-12 DIAGNOSIS — M79604 Pain in right leg: Secondary | ICD-10-CM

## 2019-01-12 DIAGNOSIS — R230 Cyanosis: Secondary | ICD-10-CM | POA: Insufficient documentation

## 2019-01-12 DIAGNOSIS — M79672 Pain in left foot: Secondary | ICD-10-CM

## 2019-01-12 DIAGNOSIS — I4819 Other persistent atrial fibrillation: Secondary | ICD-10-CM | POA: Diagnosis not present

## 2019-01-12 NOTE — Assessment & Plan Note (Signed)
Patient has marked discoloration of both lower legs.  We discussed several different things that may be contributing including neuropathy, arterial insufficiency, and venous insufficiency.  Poor cardiac function could be contributing as well.  At this point, we are going to obtain noninvasive arterial and venous studies in the near future at the patient's convenience.  I have prescribed compression stockings and recommend she wears these daily.  I recommend she elevate her legs and try to increase her activity.  I discussed the pathophysiology and natural history of both arterial and venous disease in some detail today.  We will see her back with noninvasive studies.  I do think she should get her nerve conduction studies as there is likely a neuropathic component as well

## 2019-01-12 NOTE — Patient Instructions (Signed)
Peripheral Vascular Disease  Peripheral vascular disease (PVD) is a disease of the blood vessels that are not part of your heart and brain. A simple term for PVD is poor circulation. In most cases, PVD narrows the blood vessels that carry blood from your heart to the rest of your body. This can reduce the supply of blood to your arms, legs, and internal organs, like your stomach or kidneys. However, PVD most often affects a person's lower legs and feet. Without treatment, PVD tends to get worse. PVD can also lead to acute ischemic limb. This is when an arm or leg suddenly cannot get enough blood. This is a medical emergency. Follow these instructions at home: Lifestyle  Do not use any products that contain nicotine or tobacco, such as cigarettes and e-cigarettes. If you need help quitting, ask your doctor.  Lose weight if you are overweight. Or, stay at a healthy weight as told by your doctor.  Eat a diet that is low in fat and cholesterol. If you need help, ask your doctor.  Exercise regularly. Ask your doctor for activities that are right for you. General instructions  Take over-the-counter and prescription medicines only as told by your doctor.  Take good care of your feet: ? Wear comfortable shoes that fit well. ? Check your feet often for any cuts or sores.  Keep all follow-up visits as told by your doctor This is important. Contact a doctor if:  You have cramps in your legs when you walk.  You have leg pain when you are at rest.  You have coldness in a leg or foot.  Your skin changes.  You are unable to get or have an erection (erectile dysfunction).  You have cuts or sores on your feet that do not heal. Get help right away if:  Your arm or leg turns cold, numb, and blue.  Your arms or legs become red, warm, swollen, painful, or numb.  You have chest pain.  You have trouble breathing.  You suddenly have weakness in your face, arm, or leg.  You become very  confused or you cannot speak.  You suddenly have a very bad headache.  You suddenly cannot see. Summary  Peripheral vascular disease (PVD) is a disease of the blood vessels.  A simple term for PVD is poor circulation. Without treatment, PVD tends to get worse.  Treatment may include exercise, low fat and low cholesterol diet, and quitting smoking. This information is not intended to replace advice given to you by your health care provider. Make sure you discuss any questions you have with your health care provider. Document Released: 06/19/2009 Document Revised: 03/07/2017 Document Reviewed: 05/02/2016 Elsevier Patient Education  2020 [employer]  
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
## 2019-01-12 NOTE — Assessment & Plan Note (Signed)
blood pressure control important in reducing the progression of atherosclerotic disease. On appropriate oral medications.  

## 2019-01-12 NOTE — Assessment & Plan Note (Signed)
Rate controlled on Pacerone

## 2019-01-13 ENCOUNTER — Other Ambulatory Visit: Payer: Self-pay | Admitting: Cardiovascular Disease

## 2019-01-13 ENCOUNTER — Other Ambulatory Visit: Payer: Self-pay | Admitting: Physician Assistant

## 2019-01-13 ENCOUNTER — Other Ambulatory Visit: Payer: Self-pay | Admitting: Family Medicine

## 2019-01-13 DIAGNOSIS — I4819 Other persistent atrial fibrillation: Secondary | ICD-10-CM

## 2019-01-13 NOTE — Telephone Encounter (Signed)
Pt's age 85, wt 23 kg, SCr 1.01, CrCl 39.03, last ov w/ CB 11/30/18.

## 2019-01-13 NOTE — Telephone Encounter (Signed)
Refill request

## 2019-01-15 ENCOUNTER — Telehealth: Payer: Self-pay

## 2019-01-15 MED ORDER — POTASSIUM CHLORIDE ER 10 MEQ PO TBCR: 20.0000 meq | EXTENDED_RELEASE_TABLET | Freq: Every day | ORAL | 5 refills | Status: DC

## 2019-01-15 NOTE — Telephone Encounter (Signed)
Given that she is only on a small dose of furosemide, we can decrease potassium dose to 20 mEq once daily.

## 2019-01-15 NOTE — Addendum Note (Signed)
Addended by: Lamar Laundry on: 01/15/2019 12:22 PM   Modules accepted: Orders

## 2019-01-15 NOTE — Telephone Encounter (Signed)
Will route to Dr. Fletcher Anon to advise. The patient [place] currently prescribed  Potassium  (2) 20 mEq tablets daily.

## 2019-01-15 NOTE — Telephone Encounter (Signed)
Spoke with patient's daughter Harshitha who states her mother has been having difficulty swallowing her potassium tablets because they are so large. She is requesting an alternative form of potassium if possible. She notes she has tried breaking them in half and giving them to her this way but she still has not been tolerating them so she has not been taking this medication on a regular basis. Please advise.

## 2019-01-15 NOTE — Telephone Encounter (Signed)
Spoke with the patient's daughter [name]. [name] aware of Dr.Arida 's recommendation to decrease Potassium to 20 mEq daily. Teresa Pelton that I will send in the Rx for (2) 10 mEq tablets which should be a bit smaller and easier for the patient to swallow. [name] verbalized understanding and voiced appreciation for the assistance.

## 2019-01-22 ENCOUNTER — Encounter (INDEPENDENT_AMBULATORY_CARE_PROVIDER_SITE_OTHER): Payer: Medicare Other

## 2019-01-22 ENCOUNTER — Ambulatory Visit (INDEPENDENT_AMBULATORY_CARE_PROVIDER_SITE_OTHER): Payer: Medicare Other | Admitting: Nurse Practitioner

## 2019-01-26 DIAGNOSIS — M79672 Pain in left foot: Secondary | ICD-10-CM | POA: Diagnosis not present

## 2019-01-26 DIAGNOSIS — R202 Paresthesia of skin: Secondary | ICD-10-CM | POA: Diagnosis not present

## 2019-01-26 DIAGNOSIS — M79671 Pain in right foot: Secondary | ICD-10-CM | POA: Diagnosis not present

## 2019-01-26 DIAGNOSIS — R200 Anesthesia of skin: Secondary | ICD-10-CM | POA: Diagnosis not present

## 2019-01-27 ENCOUNTER — Ambulatory Visit (INDEPENDENT_AMBULATORY_CARE_PROVIDER_SITE_OTHER): Payer: Medicare Other | Admitting: Nurse Practitioner

## 2019-01-27 ENCOUNTER — Encounter (INDEPENDENT_AMBULATORY_CARE_PROVIDER_SITE_OTHER): Payer: Self-pay | Admitting: Nurse Practitioner

## 2019-01-27 ENCOUNTER — Telehealth (INDEPENDENT_AMBULATORY_CARE_PROVIDER_SITE_OTHER): Payer: Self-pay | Admitting: Nurse Practitioner

## 2019-01-27 ENCOUNTER — Other Ambulatory Visit: Payer: Self-pay

## 2019-01-27 ENCOUNTER — Ambulatory Visit (INDEPENDENT_AMBULATORY_CARE_PROVIDER_SITE_OTHER): Payer: Medicare Other

## 2019-01-27 VITALS — BP 189/45 | HR 62 | Resp 12 | Ht 62.0 in | Wt 140.0 lb

## 2019-01-27 DIAGNOSIS — M79604 Pain in right leg: Secondary | ICD-10-CM | POA: Diagnosis not present

## 2019-01-27 DIAGNOSIS — M8949 Other hypertrophic osteoarthropathy, multiple sites: Secondary | ICD-10-CM

## 2019-01-27 DIAGNOSIS — R230 Cyanosis: Secondary | ICD-10-CM

## 2019-01-27 DIAGNOSIS — M79605 Pain in left leg: Secondary | ICD-10-CM

## 2019-01-27 DIAGNOSIS — M79672 Pain in left foot: Secondary | ICD-10-CM | POA: Diagnosis not present

## 2019-01-27 DIAGNOSIS — L602 Onychogryphosis: Secondary | ICD-10-CM

## 2019-01-27 DIAGNOSIS — M79671 Pain in right foot: Secondary | ICD-10-CM | POA: Diagnosis not present

## 2019-01-27 DIAGNOSIS — I771 Stricture of artery: Secondary | ICD-10-CM | POA: Diagnosis not present

## 2019-01-27 DIAGNOSIS — M159 Polyosteoarthritis, unspecified: Secondary | ICD-10-CM

## 2019-01-27 NOTE — Progress Notes (Signed)
SUBJECTIVE:  Patient ID: [name], female    DOB: 01-15-1930, 85 y.o.   MRN: [id number] Chief Complaint  Patient presents with  . Follow-up    HPI  [name] is a 85 y.o. female that presents today for follow-up of noninvasive studies due to pain and marked discoloration of her bilateral lower extremities.  The discoloration extends from the patient's ankles to her toes.  The patient recently underwent bilateral nerve conduction studies which does reveal that the patient has peripheral neuropathy.  Much of the pain that she describes is consistent with neuropathy.  However, the patient's discoloration has been prominent for about a year or so and with swelling it actually gets worse.  It is noted that the discoloration does get a little bit better with elevation.  The patient's toes are a dusky purple/blue.  Patient has recently had cardiac issues including atrial fibrillation however recent echocardiogram revealed normal ejection fraction.  However the atrial fibrillation is currently rate controlled.  The patient denies any fever, chills, nausea, vomiting or diarrhea.  She denies any chest pain or shortness of breath.  An x-ray noted that she had significant atherosclerosis in her abdominal arteries several years ago.  The patient also has thickened toenails on her great toes and complains of severe pain within these toe nails.  Today the patient underwent noninvasive studies.  The patient had a venous reflux study done which reveals reflux in the right popliteal vein and the right common femoral vein and proximal thigh great saphenous vein.  The patient showed no evidence of DVT or superficial venous thrombosis.  There is also worth noting that the great saphenous vein is less than 2 mm in some areas.  Patient also underwent bilateral ABIs.  The right ABI 0.97 with a left of 0.99.  The patient had triphasic waveforms in the right anterior tibial artery with monophasic in the right  posterior tibial artery.  The right digit toe waveforms are dampened.  The left lower extremity has triphasic tibial artery waveforms, however the left digit waveform is nearly flat.  The patient has a TBI of 0.12 on the left and 0.38 on the right.  It is also worth noting that the blood pressure on the right upper extremity was 195 in the left being 216, the difference of which being consistent with subclavian stenosis.  Past Medical History:  Diagnosis Date  . Abdominal aortic atherosclerosis ([place]) 09/2015   by xray  . Anxiety   . BCC (basal cell carcinoma of skin) 05/2011   on nose  . Breast lesion    a. diagnosed with complex sclerosing lesion with calcifications of the right breast in 08/2016  . Chest pain    a. nuclear stress test 03/2012: normal; b. 09/2018 MV: EF >65%, no isch/scar.   . DDD (degenerative disc disease), lumbosacral 12/06/1992  . Gallstones 09/2015   incidentally by xray  . GERD (gastroesophageal reflux disease) 05/1999  . HERPES ZOSTER 04/13/2007   Qualifier: Diagnosis of  By: [name], [name]   . Hiatal hernia   . History of shingles    ophthalmic, takes acyclovir daily  . Hyperlipidemia 12/2003  . Hypertension 1990  . Mitral regurgitation    a. echo 03/2012: EF 60-65%, no RWMA, mild to mod AI/MR, mod TR, PASP 37 mmHg  . Osteoarthritis 08/21/1989  . Osteoporosis with fracture 11/1997   with compression fracture T12  . Persistent atrial fibrillation ([place]) 03/16/2012   a. CHADS2VASc =>  5 (HTN, age x 2, vascular disease, sex category)-->Eliquis 5 BID;  b. Recurrent AF in 06/2016;  c. 01/2017 s/p DCCV;  d. 02/2017 recurrent AFib->Amio added->DCCV; e. 03/2017 recurrent AFib, s/p reload of amio and DCCV 04/02/2017  . Rheumatoid arthritis ([place])   . RLS (restless legs syndrome)     Past Surgical History:  Procedure Laterality Date  . ABDOMINAL HYSTERECTOMY  Age 46 - 63   S/P TAH  . abdominal ultrasound  04/23/2004   Gallstones  . BREAST LUMPECTOMY WITH  RADIOACTIVE SEED LOCALIZATION Right 10/2016   breast lumpectomy with radioactive seed localization and margin assessment for complex sclerosing lesion [facility])  . BREAST LUMPECTOMY WITH RADIOACTIVE SEED LOCALIZATION Right 11/05/2016   Procedure: RIGHT BREAST LUMPECTOMY WITH RADIOACTIVE SEED LOCALIZATION;  Surgeon: Fanny Skates, MD;  Location: [facility];  Service: General;  Laterality: Right;  . CARDIOVERSION N/A 01/31/2017   Procedure: CARDIOVERSION;  Surgeon: Wellington Hampshire, MD;  Location: [facility];  Service: Cardiovascular;  Laterality: N/A;  . CARDIOVERSION N/A 03/03/2017   Procedure: CARDIOVERSION;  Surgeon: Wellington Hampshire, MD;  Location: [facility];  Service: Cardiovascular;  Laterality: N/A;  . CARDIOVERSION N/A 04/02/2017   Procedure: CARDIOVERSION;  Surgeon: Wellington Hampshire, MD;  Location: [facility];  Service: Cardiovascular;  Laterality: N/A;  . CARDIOVERSION N/A 01/16/2018   Procedure: CARDIOVERSION;  Surgeon: Wellington Hampshire, MD;  Location: [facility];  Service: Cardiovascular;  Laterality: N/A;  . CATARACT EXTRACTION  05/2010   left eye  . CERVICAL DISCECTOMY  1992   Fusion (Dr. Joya Salm)  . ESI Bilateral 01/2016   S1 transforaminal ESI x3  . ESOPHAGOGASTRODUODENOSCOPY  01/01/2002   with ulcer, bx. neg; + stricture gastric ulcer with hemorrhage  . ESOPHAGOGASTRODUODENOSCOPY  04/23/2004   Esophageal stricture -- dilated  . nuclear stress test  03/2012   no ischemia  . SKIN CANCER EXCISION  05/20/2011   BCC from nose  . US ECHOCARDIOGRAPHY  03/2012   in fl[place], EF 60%, mild-mod aortic, mitral, tricuspid regurg, mildly dilated LA    Social History   Socioeconomic History  . Marital status: Widowed    Spouse name: Not on file  . Number of children: 5  . Years of education: Not on file  . Highest education level: Not on file  Occupational History  . Occupation: Retired - Occupational psychologist: RETIRED  Social Needs  . Financial resource strain: Not on  file  . Food insecurity    Worry: Not on file    Inability: Not on file  . Transportation needs    Medical: Not on file    Non-medical: Not on file  Tobacco Use  . Smoking status: Former Smoker    Packs/day: 0.25    Years: 1.00    Pack years: 0.25    Quit date: 04/08/1976    Years since quitting: 42.8  . Smokeless tobacco: Never Used  . Tobacco comment: pt smokes occasionally "socially"  Substance and Sexual Activity  . Alcohol use: No    Alcohol/week: 1.0 standard drinks    Types: 1 Glasses of wine per week    Frequency: Never    Comment: occassional  . Drug use: No  . Sexual activity: Never  Lifestyle  . Physical activity    Days per week: Not on file    Minutes per session: Not on file  . Stress: Not on file  Relationships  . Social connections    Talks on phone: Not on  file    Gets together: Not on file    Attends religious service: Not on file    Active member of club or organization: Not on file    Attends meetings of clubs or organizations: Not on file    Relationship status: Not on file  . Intimate partner violence    Fear of current or ex partner: Not on file    Emotionally abused: Not on file    Physically abused: Not on file    Forced sexual activity: Not on file  Other Topics Concern  . Not on file  Social History Narrative   Lives with grandson who smokes, dog   From [place].  Widowed 01/1999; 1st marriage 25 years / 2nd marriage 13 years.   One son deceased 01/16/23.   Activity: active with dog   Diet: good water, fruits/vegetables daily    Family History  Problem Relation Age of Onset  . Emphysema Mother   . Heart disease Father        MI  . Stroke Father        first at age 45.  . Stroke Sister   . Diabetes Sister   . Hypertension Brother   . Heart disease Brother        Heart Valve  . Stroke Brother   . Diabetes Brother   . Cancer Brother        esophageal  . Diabetes Brother   . Diabetes Brother   . Colon cancer Neg Hx      Allergies  Allergen Reactions  . Penicillins Rash and Other (See Comments)    Childhood Allergy Has patient had a PCN reaction causing immediate rash, facial/tongue/throat swelling, SOB or lightheadedness with hypotension: Yes Has patient had a PCN reaction causing severe rash involving mucus membranes or skin necrosis: Unknown Has patient had a PCN reaction that required hospitalization: No Has patient had a PCN reaction occurring within the last 10 years: No If all of the above answers are "NO", then may proceed with Cephalosporin use.      Review of Systems   Review of Systems: Negative Unless Checked Constitutional: [] Weight loss  [] Fever  [] Chills Cardiac: [] Chest pain   [x]  Atrial Fibrillation  [] Palpitations   [] Shortness of breath when laying flat   [] Shortness of breath with exertion. [] Shortness of breath at rest Vascular:  [] Pain in legs with walking   [] Pain in legs with standing [] Pain in legs when laying flat   [] Claudication    [] Pain in feet when laying flat    [] History of DVT   [] Phlebitis   [x] Swelling in legs   [x] Varicose veins   [] Non-healing ulcers Pulmonary:   [] Uses home oxygen   [] Productive cough   [] Hemoptysis   [] Wheeze  [] COPD   [] Asthma Neurologic:  [] Dizziness   [] Seizures  [] Blackouts [] History of stroke   [] History of TIA  [] Aphasia   [] Temporary Blindness   [] Weakness or numbness in arm   [x] Weakness or numbness in leg Musculoskeletal:   [] Joint swelling   [] Joint pain   [] Low back pain  []  History of Knee Replacement [x] Arthritis [] back Surgeries  []  Spinal Stenosis    Hematologic:  [x] Easy bruising  [] Easy bleeding   [] Hypercoagulable state   [] Anemic Gastrointestinal:  [] Diarrhea   [] Vomiting  [x] Gastroesophageal reflux/heartburn   [] Difficulty swallowing. [] Abdominal pain Genitourinary:  [] Chronic kidney disease   [] Difficult urination  [] Anuric   [] Blood in urine [] Frequent urination  [] Burning with urination   [] Hematuria Skin:  []   Rashes   [] Ulcers  [] Wounds Psychological:  [] History of anxiety   []  History of major depression  []  Memory Difficulties      OBJECTIVE:   Physical Exam  BP (!) 189/45 (BP Location: Right Arm, Patient Position: Sitting, Cuff Size: Normal)   Pulse 62   Resp 12   Ht 5\' 2"  (1.575 m)   Wt 140 lb (63.5 kg)   BMI 25.61 kg/m   Gen: WD/WN, NAD Head: [place]/AT, No temporalis wasting.  Ear/Nose/Throat: Hearing grossly intact, nares w/o erythema or drainage Eyes: PER, EOMI, sclera nonicteric.  Neck: Supple, no masses.  No JVD.  Pulmonary:  Good air movement, no use of accessory muscles.  Cardiac: RRR Vascular:  1+ edema left ankle.  The patient has marked cyanosis of both lower extremities ending to right above the ankle.  The feet and toes get progressively more cyanotic moving from the ankle to the toes.  The feet and toes are also cool to the touch.  Capillary refill is present and brisk.  Purplish discoloration somewhat consistent with venous stasis. Vessel Right Left  Radial Palpable Palpable  Dorsalis Pedis Not Palpable Not Palpable  Posterior Tibial Palpable Palpable   Gastrointestinal: soft, non-distended. No guarding/no peritoneal signs.  Musculoskeletal: M/S 5/5 throughout.  No deformity or atrophy.  Neurologic: Pain and light touch intact in extremities.  Symmetrical.  Speech is fluent. Motor exam as listed above. Psychiatric: Judgment intact, Mood & affect appropriate for pt's clinical situation. Dermatologic: No Venous rashes. No Ulcers Noted.  No changes consistent with cellulitis. Lymph : No Cervical lymphadenopathy, no lichenification or skin changes of chronic lymphedema.       ASSESSMENT AND PLAN:  1. Cyanosis Had a thorough discussion with the patient as well as daughter the normal anatomy and physiology of the arteries versus veins as well as the pathology of peripheral artery disease versus venous disease.  Based upon the patient's noninvasive studies in the reduced toe pressures as well  as the nearly flat toe waveforms on the left, I feel that it is prudent to proceed with an angiogram.  I discussed with the patient and her daughter, that an angiogram would allow [place] to better assess the patient's arterial status.  The cyanosis could indeed be due to venous stasis although the patient does not have significant reflux.  This could be a component of microvascular disease or a stenosis not readily detected by the ABIs.  I made it clear to the patient and daughter that this angiogram could include an intervention however it may be purely diagnostic.  I also discussed how the patient's cardiovascular status could also be playing into her cyanosis.  After discussing the procedure, as well as the risks benefits and alternatives with the patient and her daughter the patient agrees to proceed with angiogram of her left lower extremity.  We will see the patient back in office following her angiogram.   2. Bilateral leg and foot pain The bilateral leg pain could indeed be due to a possible component of peripheral artery disease however given her recent nerve conduction studies and revealing of profound peripheral neuropathy, this is the likely source of pain for the patient's lower extremities.  We will defer further treatment to either the primary care provider or neurology.   3. Primary osteoarthritis involving multiple joints Continue NSAID medications as already ordered, these medications have been reviewed and there are no changes at this time.  Continued activity and therapy was stressed.   4. Hypertrophic  toenail Patient has thickened toenails on her bilateral great toes that are giving her a significant amount of pain.  These toenails appear to possibly be ingrown in some areas.  Patient has not yet seen a podiatrist.  Given her multiple issues with her feet I felt it was prudent that she have them evaluated by podiatry.  We will place a referral for the patient. - Ambulatory  referral to Podiatry  5. Subclavian artery stenosis, right (HCC) Currently the patient has no signs and symptoms of subclavian artery stenosis.  I discussed with the patient differently she should be looking for such as numbness, weakness or pain in the upper extremity.  At this time we will continue to follow however no intervention is necessary at this time.   Current Outpatient Medications on File Prior to Visit  Medication Sig Dispense Refill  . acetaminophen (TYLENOL) 500 MG tablet Take 1,000 mg every 8 (eight) hours as needed by mouth for moderate pain.     Marland Kitchen acyclovir (ZOVIRAX) 400 MG tablet Take 400 mg 2 (two) times daily by mouth.     Marland Kitchen amiodarone (PACERONE) 200 MG tablet TAKE 1 TABLET BY MOUTH DAILY 90 tablet 3  . atorvastatin (LIPITOR) 20 MG tablet TAKE ONE TABLET BY MOUTH EVERY DAY 90 tablet 2  . DULoxetine (CYMBALTA) 60 MG capsule TAKE 1 CAPSULE BY MOUTH EVERY DAY 90 capsule 1  . ELIQUIS 2.5 MG TABS tablet TAKE ONE TABLET BY MOUTH TWICE DAILY 180 tablet 1  . furosemide (LASIX) 20 MG tablet TAKE 1 TABLET BY MOUTH DAILY 90 tablet 0  . gabapentin (NEURONTIN) 300 MG capsule TAKE 1 CAPSULE BY MOUTH THREE TIMES DAILY 270 capsule 1  . metoprolol tartrate (LOPRESSOR) 25 MG tablet Take 0.5 tablets (12.5 mg total) by mouth 2 (two) times daily. 90 tablet 2  . pantoprazole (PROTONIX) 40 MG tablet TAKE ONE TABLET BY MOUTH EVERY DAY 90 tablet 2  . potassium chloride (KLOR-CON) 10 MEQ tablet Take 2 tablets (20 mEq total) by mouth daily. 60 tablet 5  . potassium chloride SA (KLOR-CON) 20 MEQ tablet     . prednisoLONE acetate (PRED FORTE) 1 % ophthalmic suspension Place 1 drop into the left eye daily.     Marland Kitchen rOPINIRole (REQUIP) 1 MG tablet TAKE TWO TABLETS BY MOUTH AT BEDTIME 180 tablet 3  . tiZANidine (ZANAFLEX) 2 MG tablet Take 2 mg by mouth every evening.     . traMADol (ULTRAM) 50 MG tablet Take 1 tablet (50 mg total) by mouth 2 (two) times daily.  0   No current facility-administered  medications on file prior to visit.     There are no Patient Instructions on file for this visit. No follow-ups on file.   Kris Hartmann, NP  This note was completed with Sales executive.  Any errors are purely unintentional.

## 2019-01-27 NOTE — Telephone Encounter (Signed)
Patient is scheduled for a LLE angio on 02/01/19 at 8am with arrival time of 7am. Patient is aware to not take Eliquis the day before and the day of the procedure. Patient is aware to go [facility] 12:30-2:30. Patient letter was given to daughter with all information included. Patient was in office when this was scheduled. AS, CMA

## 2019-01-28 ENCOUNTER — Other Ambulatory Visit
Admission: RE | Admit: 2019-01-28 | Discharge: 2019-01-28 | Disposition: A | Discharge status: Home / Self Care | Payer: Medicare Other | Source: Ambulatory Visit | Attending: Vascular Surgery | Admitting: Vascular Surgery

## 2019-01-28 DIAGNOSIS — M79671 Pain in right foot: Secondary | ICD-10-CM | POA: Insufficient documentation

## 2019-01-28 DIAGNOSIS — Z01812 Encounter for preprocedural laboratory examination: Secondary | ICD-10-CM | POA: Insufficient documentation

## 2019-01-28 DIAGNOSIS — Z20828 Contact with and (suspected) exposure to other viral communicable diseases: Secondary | ICD-10-CM | POA: Insufficient documentation

## 2019-01-28 LAB — SARS CORONAVIRUS 2 (TAT 6-24 HRS): SARS Coronavirus 2: NEGATIVE

## 2019-01-31 ENCOUNTER — Other Ambulatory Visit (INDEPENDENT_AMBULATORY_CARE_PROVIDER_SITE_OTHER): Payer: Self-pay | Admitting: Nurse Practitioner

## 2019-01-31 MED ORDER — CLINDAMYCIN PHOSPHATE 300 MG/50ML IV SOLN
300.0000 mg | Freq: Once | INTRAVENOUS | Status: AC
  Administered 2019-02-01: 08:00:00 300 mg via INTRAVENOUS

## 2019-02-01 ENCOUNTER — Encounter
Admission: RE | Disposition: A | Discharge status: Home / Self Care | Payer: Self-pay | Source: Home / Self Care | Attending: Vascular Surgery

## 2019-02-01 ENCOUNTER — Encounter: Payer: Self-pay | Admitting: *Deleted

## 2019-02-01 ENCOUNTER — Other Ambulatory Visit: Payer: Self-pay

## 2019-02-01 ENCOUNTER — Ambulatory Visit
Admission: RE | Admit: 2019-02-01 | Discharge: 2019-02-01 | Disposition: A | Discharge status: Home / Self Care | Payer: Medicare Other | Attending: Vascular Surgery | Admitting: Vascular Surgery

## 2019-02-01 DIAGNOSIS — Z7901 Long term (current) use of anticoagulants: Secondary | ICD-10-CM | POA: Diagnosis not present

## 2019-02-01 DIAGNOSIS — M1990 Unspecified osteoarthritis, unspecified site: Secondary | ICD-10-CM | POA: Insufficient documentation

## 2019-02-01 DIAGNOSIS — Z79899 Other long term (current) drug therapy: Secondary | ICD-10-CM | POA: Diagnosis not present

## 2019-02-01 DIAGNOSIS — I4819 Other persistent atrial fibrillation: Secondary | ICD-10-CM | POA: Diagnosis not present

## 2019-02-01 DIAGNOSIS — M79662 Pain in left lower leg: Secondary | ICD-10-CM | POA: Insufficient documentation

## 2019-02-01 DIAGNOSIS — I10 Essential (primary) hypertension: Secondary | ICD-10-CM | POA: Diagnosis not present

## 2019-02-01 DIAGNOSIS — Q742 Other congenital malformations of lower limb(s), including pelvic girdle: Secondary | ICD-10-CM | POA: Diagnosis not present

## 2019-02-01 DIAGNOSIS — Z87891 Personal history of nicotine dependence: Secondary | ICD-10-CM | POA: Insufficient documentation

## 2019-02-01 DIAGNOSIS — M79661 Pain in right lower leg: Secondary | ICD-10-CM | POA: Insufficient documentation

## 2019-02-01 DIAGNOSIS — I771 Stricture of artery: Secondary | ICD-10-CM | POA: Diagnosis not present

## 2019-02-01 DIAGNOSIS — R230 Cyanosis: Secondary | ICD-10-CM

## 2019-02-01 DIAGNOSIS — G2581 Restless legs syndrome: Secondary | ICD-10-CM | POA: Diagnosis not present

## 2019-02-01 DIAGNOSIS — E785 Hyperlipidemia, unspecified: Secondary | ICD-10-CM | POA: Diagnosis not present

## 2019-02-01 DIAGNOSIS — I70223 Atherosclerosis of native arteries of extremities with rest pain, bilateral legs: Secondary | ICD-10-CM | POA: Diagnosis not present

## 2019-02-01 HISTORY — PX: LOWER EXTREMITY ANGIOGRAPHY: CATH118251

## 2019-02-01 LAB — CREATININE, SERUM
Creatinine, Ser: 0.93 mg/dL (ref 0.44–1.00)
GFR calc Af Amer: >60 mL/min (ref >60)
GFR calc non Af Amer: 55 mL/min — ABNORMAL LOW (ref >60)

## 2019-02-01 LAB — BUN: BUN: 16 mg/dL (ref 8–23)

## 2019-02-01 SURGERY — LOWER EXTREMITY ANGIOGRAPHY
Anesthesia: Moderate Sedation | Site: Leg Lower | Laterality: Left

## 2019-02-01 MED ORDER — NITROGLYCERIN 1 MG/10 ML FOR IR/CATH LAB
INTRA_ARTERIAL | Status: DC | PRN
  Administered 2019-02-01: 250 ug

## 2019-02-01 MED ORDER — MIDAZOLAM HCL 5 MG/5ML IJ SOLN
INTRAMUSCULAR | Status: AC
  Filled 2019-02-01: qty 5

## 2019-02-01 MED ORDER — HYDRALAZINE HCL 20 MG/ML IJ SOLN
5.0000 mg | INTRAMUSCULAR | Status: DC | PRN
  Administered 2019-02-01: 5 mg via INTRAVENOUS

## 2019-02-01 MED ORDER — ONDANSETRON HCL 4 MG/2ML IJ SOLN: 4.0000 mg | Freq: Four times a day (QID) | INTRAMUSCULAR | Status: DC | PRN

## 2019-02-01 MED ORDER — SODIUM CHLORIDE 0.9 % IV SOLN: INTRAVENOUS | Status: DC

## 2019-02-01 MED ORDER — FAMOTIDINE 20 MG PO TABS: 40.0000 mg | ORAL_TABLET | Freq: Once | ORAL | Status: DC | PRN

## 2019-02-01 MED ORDER — ACETAMINOPHEN 325 MG PO TABS: 650.0000 mg | ORAL_TABLET | ORAL | Status: DC | PRN

## 2019-02-01 MED ORDER — IODIXANOL 320 MG/ML IV SOLN
INTRAVENOUS | Status: DC | PRN
  Administered 2019-02-01: 80 mL via INTRA_ARTERIAL

## 2019-02-01 MED ORDER — ASPIRIN EC 81 MG PO TBEC: 81.0000 mg | DELAYED_RELEASE_TABLET | Freq: Every day | ORAL | 2 refills | Status: DC

## 2019-02-01 MED ORDER — DIPHENHYDRAMINE HCL 50 MG/ML IJ SOLN: 50.0000 mg | Freq: Once | INTRAMUSCULAR | Status: DC | PRN

## 2019-02-01 MED ORDER — MIDAZOLAM HCL 2 MG/ML PO SYRP: 8.0000 mg | ORAL_SOLUTION | Freq: Once | ORAL | Status: DC | PRN

## 2019-02-01 MED ORDER — METHYLPREDNISOLONE SODIUM SUCC 125 MG IJ SOLR: 125.0000 mg | Freq: Once | INTRAMUSCULAR | Status: DC | PRN

## 2019-02-01 MED ORDER — HYDROMORPHONE HCL 1 MG/ML IJ SOLN: 1.0000 mg | Freq: Once | INTRAMUSCULAR | Status: DC | PRN

## 2019-02-01 MED ORDER — ASPIRIN EC 81 MG PO TBEC: 81.0000 mg | DELAYED_RELEASE_TABLET | Freq: Every day | ORAL | Status: DC

## 2019-02-01 MED ORDER — NITROGLYCERIN 1 MG/10 ML FOR IR/CATH LAB
INTRA_ARTERIAL | Status: AC
  Filled 2019-02-01: qty 10

## 2019-02-01 MED ORDER — HYDRALAZINE HCL 20 MG/ML IJ SOLN
INTRAMUSCULAR | Status: AC
  Filled 2019-02-01: qty 1

## 2019-02-01 MED ORDER — SODIUM CHLORIDE 0.9% FLUSH: 3.0000 mL | INTRAVENOUS | Status: DC | PRN

## 2019-02-01 MED ORDER — SODIUM CHLORIDE 0.9 % IV SOLN: 250.0000 mL | INTRAVENOUS | Status: DC | PRN

## 2019-02-01 MED ORDER — HEPARIN SODIUM (PORCINE) 1000 UNIT/ML IJ SOLN
INTRAMUSCULAR | Status: DC | PRN
  Administered 2019-02-01: 4000 [IU] via INTRAVENOUS

## 2019-02-01 MED ORDER — MIDAZOLAM HCL 2 MG/2ML IJ SOLN
INTRAMUSCULAR | Status: DC | PRN
  Administered 2019-02-01: 0.5 mg via INTRAVENOUS
  Administered 2019-02-01: 2 mg via INTRAVENOUS

## 2019-02-01 MED ORDER — SODIUM CHLORIDE 0.9 % IV SOLN
INTRAVENOUS | Status: DC
  Administered 2019-02-01: 08:00:00 via INTRAVENOUS

## 2019-02-01 MED ORDER — SODIUM CHLORIDE 0.9% FLUSH: 3.0000 mL | Freq: Two times a day (BID) | INTRAVENOUS | Status: DC

## 2019-02-01 MED ORDER — HEPARIN SODIUM (PORCINE) 1000 UNIT/ML IJ SOLN
INTRAMUSCULAR | Status: AC
  Filled 2019-02-01: qty 1

## 2019-02-01 MED ORDER — FENTANYL CITRATE (PF) 100 MCG/2ML IJ SOLN
INTRAMUSCULAR | Status: AC
  Filled 2019-02-01: qty 2

## 2019-02-01 MED ORDER — LABETALOL HCL 5 MG/ML IV SOLN: 10.0000 mg | INTRAVENOUS | Status: DC | PRN

## 2019-02-01 MED ORDER — FENTANYL CITRATE (PF) 100 MCG/2ML IJ SOLN
INTRAMUSCULAR | Status: DC | PRN
  Administered 2019-02-01: 25 ug via INTRAVENOUS
  Administered 2019-02-01: 50 ug via INTRAVENOUS

## 2019-02-01 MED ORDER — CLINDAMYCIN PHOSPHATE 300 MG/50ML IV SOLN
INTRAVENOUS | Status: AC
  Administered 2019-02-01: 300 mg via INTRAVENOUS
  Filled 2019-02-01: qty 50

## 2019-02-01 SURGICAL SUPPLY — 14 items
BALLN ULTRVRSE 2.5X220X150 (BALLOONS) ×3
BALLOON ULTRVRSE 2.5X220X150 (BALLOONS) IMPLANT
CATH BEACON 5 .038 100 VERT TP (CATHETERS) ×2 IMPLANT
CATH PIG 70CM (CATHETERS) ×2 IMPLANT
DEVICE PRESTO INFLATION (MISCELLANEOUS) ×2 IMPLANT
DEVICE STARCLOSE SE CLOSURE (Vascular Products) ×2 IMPLANT
PACK ANGIOGRAPHY (CUSTOM PROCEDURE TRAY) ×3 IMPLANT
SHEATH ANL 5FRX90 (SHEATH) ×2 IMPLANT
SHEATH BRITE TIP 5FRX11 (SHEATH) ×2 IMPLANT
SYR MEDRAD MARK 7 150ML (SYRINGE) ×2 IMPLANT
TUBING CONTRAST HIGH PRESS 72 (TUBING) ×2 IMPLANT
WIRE G V18X300CM (WIRE) ×2 IMPLANT
WIRE J 3MM .035X145CM (WIRE) ×2 IMPLANT
WIRE MAGIC TORQUE 260C (WIRE) ×2 IMPLANT

## 2019-02-01 NOTE — H&P (Signed)
[facility] History & Physical Update  The patient was interviewed and re-examined.  The patient's previous History and Physical has been reviewed and is unchanged.  There is no change in the plan of care. We plan to proceed with the scheduled procedure.  Leotis Pain, MD  02/01/2019, 8:06 AM

## 2019-02-01 NOTE — Op Note (Signed)
[facility]  Percutaneous Study/Intervention Procedural Note   Date of Surgery: 02/01/2019  Surgeon(s):DEW,JASON    Assistants:none  Pre-operative Diagnosis: PAD with rest pain and cyanosis BLE  Post-operative diagnosis:  Same  Procedure(s) Performed:             1.  Ultrasound guidance for vascular access right femoral artery             2.  Catheter placement into left SFA from right femoral approach             3.  Aortogram and selective left lower extremity angiogram             4.  Percutaneous transluminal angioplasty of left anterior tibial artery with 2.5 mm diameter by 22 cm length angioplasty balloon             5.   Percutaneous transluminal angioplasty of left posterior tibial artery with 2.5 mm diameter by 22 cm length angioplasty balloon  6.   StarClose closure device left femoral artery  EBL: 10 cc  Contrast: 80 cc  Fluoro Time: 7.9 minutes  Moderate Conscious Sedation Time: approximately 30 minutes using 2.5 mg of Versed and 75 mcg of Fentanyl              Indications:  Patient is a 85 y.o.female with cyanosis and pain in the foot. The patient has noninvasive study showing flat digital waveforms bilatearlly. The patient is brought in for angiography for further evaluation and potential treatment.  Due to the limb threatening nature of the situation, angiogram was performed for attempted limb salvage. The patient is aware that if the procedure fails, amputation would be expected.  The patient also understands that even with successful revascularization, amputation may still be required due to the severity of the situation. Risks and benefits are discussed and informed consent is obtained.   Procedure:  The patient was identified and appropriate procedural time out was performed.  The patient was then placed supine on the table and prepped and draped in the usual sterile fashion. Moderate conscious sedation was administered during a face to face  encounter with the patient throughout the procedure with my supervision of the RN administering medicines and monitoring the patient's vital signs, pulse oximetry, telemetry and mental status throughout from the start of the procedure until the patient was taken to the recovery room. Ultrasound was used to evaluate the right common femoral artery.  It was patent .  A digital ultrasound image was acquired.  A Seldinger needle was used to access the right common femoral artery under direct ultrasound guidance and a permanent image was performed.  A 0.035 J wire was advanced without resistance and a 5Fr sheath was placed.  Pigtail catheter was placed into the aorta and an AP aortogram was performed. This demonstrated normal aorta and tortuous but not stenotic iliac arteries.  Left main renal artery is low lying and widely patent.  Right renal artery without stenosis. I then crossed the aortic bifurcation and advanced to the left femoral head. Selective left lower extremity angiogram was then performed. This demonstrated calcific but not stenotic left common femoral artery, profunda femoris artery, and superficial femoral artery and popliteal arteries with only mild disease in the 20 to 30% range.  There was a typical tibial trifurcation with very sluggish flow due to her poor ejection fraction it was difficult to opacify.  Once the catheter was advanced into the mid to distal SFA better  imaging was seen and the posterior tibial artery was the best runoff to the foot but it had a high-grade stenosis in the 85 to 90% range in the mid to distal segment and about a 70% stenosis in the midsegment.  The anterior tibial and peroneal arteries were small and was difficult to opacify initially. It was felt that it was in the patient's best interest to proceed with intervention after these images to avoid a second procedure and a larger amount of contrast and fluoroscopy based off of the findings from the initial angiogram. The  patient was systemically heparinized and a 5 [place] 90 sheath was then placed over the Magic tourque wire. I then used a Kumpe catheter and the V18 wire to navigate down into the posterior tibial artery where selective imaging confirmed the above findings as well as a roughly 80% stenosis at the level of the ankle and I was able to cross the stenoses without difficulty with a V 18 wire.  A 2.5 mm diameter by 22 cm length angioplasty balloon was then inflated from the ankle up to the mid posterior tibial artery and taken to 12 atm for 1 minute.  Imaging following this showed markedly improved flow with brisk flow into the foot and less than 20% residual stenosis in all 3 areas.  I then used a Kumpe catheter and the V 18 wire to selectively cannulate the anterior tibial artery and perform selective imaging on this as well.  This did demonstrate about a 75 to 80% stenosis in the mid to distal segment and a smaller vessel.  I then crossed this lesion without difficulty with a V 18 wire and performed angioplasty on the anterior tibial artery as well with a 2.5 mm diameter balloon inflated to 10 atm for 1 minute.  Completion imaging with the sheath tip in the anterior tibial artery showed markedly improved flow with only about a 20% residual stenosis.  It should also be noted there was significant pooling in the deep venous system likely creating some of her cyanotic appearance of her foot as well, but with improvement of her arterial flow hopefully her symptoms will be significantly improved.  I elected to terminate the procedure. The sheath was removed and StarClose closure device was deployed in the right femoral artery with excellent hemostatic result. The patient was taken to the recovery room in stable condition having tolerated the procedure well.  Findings:               Aortogram:  normal aorta and tortuous but not stenotic iliac arteries.  Left main renal artery is low lying and widely patent.  Right renal  artery without stenosis             Left lower Extremity:  Calcific but not stenotic left common femoral artery, profunda femoris artery, and superficial femoral artery and popliteal arteries with only mild disease in the 20 to 30% range.  There was a typical tibial trifurcation with very sluggish flow due to her poor ejection fraction it was difficult to opacify.  Once the catheter was advanced into the mid to distal SFA better imaging was seen and the posterior tibial artery was the best runoff to the foot but it had a high-grade stenosis in the 85 to 90% range in the mid to distal segment and about a 70% stenosis in the midsegment.  The anterior tibial and peroneal arteries were small and was difficult to opacify initially.   Disposition: Patient was taken  to the recovery room in stable condition having tolerated the procedure well.  Complications: None  Leotis Pain 02/01/2019 8:56 AM   This note was created with Dragon Medical transcription system. Any errors in dictation are purely unintentional.

## 2019-02-08 ENCOUNTER — Encounter: Payer: Self-pay | Admitting: Family Medicine

## 2019-02-15 ENCOUNTER — Telehealth (INDEPENDENT_AMBULATORY_CARE_PROVIDER_SITE_OTHER): Payer: Self-pay

## 2019-02-15 NOTE — Telephone Encounter (Signed)
Patient daughter called stating her mother had left le angio on 10/26 and had some questions on how long it will take for swelling to go down in her foot before they schedule for the right le angio. The daughter stated that the left foot had some swelling but has decrease some from elevating and wearing compressions stockings. I spoke with Eulogio Ditch NP and she advise that swelling is to be expected and that she recommend for the patient to elevate,wear compression stockings, and some exercising will help overtime. Also Eulogio Ditch NP stated the patient can discuss about the next procedure when she comes in for her follow up.

## 2019-02-23 ENCOUNTER — Other Ambulatory Visit: Payer: Self-pay

## 2019-02-23 ENCOUNTER — Encounter: Payer: Self-pay | Admitting: Podiatry

## 2019-02-23 ENCOUNTER — Ambulatory Visit: Payer: Medicare Other | Admitting: Podiatry

## 2019-02-23 VITALS — BP 166/81 | HR 72

## 2019-02-23 DIAGNOSIS — M79674 Pain in right toe(s): Secondary | ICD-10-CM

## 2019-02-23 DIAGNOSIS — L853 Xerosis cutis: Secondary | ICD-10-CM | POA: Diagnosis not present

## 2019-02-23 DIAGNOSIS — B351 Tinea unguium: Secondary | ICD-10-CM | POA: Diagnosis not present

## 2019-02-23 DIAGNOSIS — M79675 Pain in left toe(s): Secondary | ICD-10-CM

## 2019-02-23 DIAGNOSIS — I739 Peripheral vascular disease, unspecified: Secondary | ICD-10-CM

## 2019-02-23 NOTE — Progress Notes (Signed)
  Subjective:  Patient ID: [name], female    DOB: [birth year],  MRN: [id number]  Chief Complaint  Patient presents with  . Nail Problem    trim nails,unable to trim herself   85 y.o. female returns for the above complaint.  Patient states that these toenails have been very painful and hurts when she is ambulating.  She ambulates with a walker.  She states that she has a history of peripheral vascular disease and is being treated by another vascular doctor.  She had an angiogram done on the left side which improved her flow significantly.  She is going to get an angiogram done on the right side within the next month or 2.  She has not done anything to help alleviate the painful toenails.  She does not have any history of ulceration.  She denies any other acute complaints.  She ambulates in regular sneakers with a walker.  Objective:   Vitals:   02/23/19 1014  BP: (!) 166/81  Pulse: 72   Podiatric Exam: Vascular: dorsalis pedis and posterior tibial pulses are palpable bilateral. Capillary return is immediate. Temperature gradient is WNL. Skin turgor WNL  Sensorium: Normal Semmes Weinstein monofilament test. Normal tactile sensation bilaterally. Nail Exam: Pt has thick disfigured discolored nails with subungual debris noted bilateral entire nail hallux through fifth toenails Ulcer Exam: There is no evidence of ulcer or pre-ulcerative changes or infection. Orthopedic Exam: Muscle tone and strength are WNL. No limitations in general ROM. No crepitus or effusions noted. HAV  B/L.  Hammer toes 2-5  B/L. Skin: No Porokeratosis. No infection or ulcers  Assessment & Plan:  Patient was evaluated and treated and all questions answered.  Onychomycosis with pain  -Nails palliatively debrided as below. -Educated on self-care  Procedure: Nail Debridement Rationale: pain  Type of Debridement: manual, sharp debridement. Instrumentation: Nail nipper, rotary burr. Number of Nails: 10   Xerosis of bilateral lower extremity -I explained to the patient the etiology of dry skin as well as all the various treatment options available including over-the-counter lotion.  I explained to the patient that there will benefit from Eucerin or Lubriderm.  At this time patient will try over-the-counter lotion if it does not help then I will consider giving a prescription lotion   Procedures and Treatment: Consent by patient was obtained for treatment procedures. The patient understood the discussion of treatment and procedures well. All questions were answered thoroughly reviewed. Debridement of mycotic and hypertrophic toenails, 1 through 5 bilateral and clearing of subungual debris. No ulceration, no infection noted.  Return Visit-Office Procedure: Patient instructed to return to the office for a follow up visit 3 months for continued evaluation and treatment.  Boneta Lucks, DPM    No follow-ups on file.

## 2019-02-26 ENCOUNTER — Other Ambulatory Visit (INDEPENDENT_AMBULATORY_CARE_PROVIDER_SITE_OTHER): Payer: Self-pay | Admitting: Vascular Surgery

## 2019-02-26 DIAGNOSIS — Z9862 Peripheral vascular angioplasty status: Secondary | ICD-10-CM

## 2019-02-26 DIAGNOSIS — I70223 Atherosclerosis of native arteries of extremities with rest pain, bilateral legs: Secondary | ICD-10-CM

## 2019-03-02 ENCOUNTER — Telehealth: Payer: Self-pay

## 2019-03-02 ENCOUNTER — Ambulatory Visit (INDEPENDENT_AMBULATORY_CARE_PROVIDER_SITE_OTHER): Payer: Medicare Other | Admitting: Vascular Surgery

## 2019-03-02 ENCOUNTER — Encounter (INDEPENDENT_AMBULATORY_CARE_PROVIDER_SITE_OTHER): Payer: Medicare Other

## 2019-03-02 NOTE — Telephone Encounter (Signed)
Discussed [employer] benefits w/pt's daughter, [name].  Pt would owe approximately $230 to 270.

## 2019-03-08 DIAGNOSIS — M48062 Spinal stenosis, lumbar region with neurogenic claudication: Secondary | ICD-10-CM | POA: Diagnosis not present

## 2019-03-08 DIAGNOSIS — M5136 Other intervertebral disc degeneration, lumbar region: Secondary | ICD-10-CM | POA: Diagnosis not present

## 2019-03-08 DIAGNOSIS — M62830 Muscle spasm of back: Secondary | ICD-10-CM | POA: Diagnosis not present

## 2019-03-08 DIAGNOSIS — M5416 Radiculopathy, lumbar region: Secondary | ICD-10-CM | POA: Diagnosis not present

## 2019-03-08 DIAGNOSIS — M7062 Trochanteric bursitis, left hip: Secondary | ICD-10-CM | POA: Diagnosis not present

## 2019-03-09 ENCOUNTER — Ambulatory Visit (INDEPENDENT_AMBULATORY_CARE_PROVIDER_SITE_OTHER): Payer: Medicare Other | Admitting: Vascular Surgery

## 2019-03-12 ENCOUNTER — Encounter (INDEPENDENT_AMBULATORY_CARE_PROVIDER_SITE_OTHER): Payer: Self-pay | Admitting: Vascular Surgery

## 2019-03-12 ENCOUNTER — Telehealth (INDEPENDENT_AMBULATORY_CARE_PROVIDER_SITE_OTHER): Payer: Self-pay

## 2019-03-12 ENCOUNTER — Telehealth: Payer: Self-pay | Admitting: Cardiovascular Disease

## 2019-03-12 ENCOUNTER — Ambulatory Visit (INDEPENDENT_AMBULATORY_CARE_PROVIDER_SITE_OTHER): Payer: Medicare Other

## 2019-03-12 ENCOUNTER — Other Ambulatory Visit: Payer: Self-pay

## 2019-03-12 ENCOUNTER — Ambulatory Visit (INDEPENDENT_AMBULATORY_CARE_PROVIDER_SITE_OTHER): Payer: Medicare Other | Admitting: Vascular Surgery

## 2019-03-12 ENCOUNTER — Encounter (INDEPENDENT_AMBULATORY_CARE_PROVIDER_SITE_OTHER): Payer: Self-pay

## 2019-03-12 VITALS — BP 143/104 | HR 74 | Resp 17 | Ht 62.0 in | Wt 140.0 lb

## 2019-03-12 DIAGNOSIS — M79672 Pain in left foot: Secondary | ICD-10-CM

## 2019-03-12 DIAGNOSIS — M79671 Pain in right foot: Secondary | ICD-10-CM

## 2019-03-12 DIAGNOSIS — Z9862 Peripheral vascular angioplasty status: Secondary | ICD-10-CM | POA: Diagnosis not present

## 2019-03-12 DIAGNOSIS — I10 Essential (primary) hypertension: Secondary | ICD-10-CM

## 2019-03-12 DIAGNOSIS — I4819 Other persistent atrial fibrillation: Secondary | ICD-10-CM

## 2019-03-12 DIAGNOSIS — I739 Peripheral vascular disease, unspecified: Secondary | ICD-10-CM

## 2019-03-12 DIAGNOSIS — I70223 Atherosclerosis of native arteries of extremities with rest pain, bilateral legs: Secondary | ICD-10-CM | POA: Diagnosis not present

## 2019-03-12 NOTE — Telephone Encounter (Signed)
Pt currently In donut hole for the remainder of the end of year and needs help with affording meds this month. Pt requested only for these last 3 wks to assist with insurance donut hole. 3 boxes placed upfront for pick up. Lot#  [id number]  Exp:9/21

## 2019-03-12 NOTE — Telephone Encounter (Signed)
Patient's daughter called back and she is now scheduled with Dr. Lucky Cowboy for right leg angio on 03/25/2019 with a 6:45 am arrival time to the [facility]. Patient will do covid testing on 03/22/2019 between 12:30-2:30 pm at the [facility]. Pre-procedure instructions were discussed and will be mailed to the patient.

## 2019-03-12 NOTE — Telephone Encounter (Signed)
Patient calling the office for samples of medication:   1.  What medication and dosage are you requesting samples for? Eliquis 2.5 mg po BID  2.  Are you currently out of this medication?  1 week left would like to know if she can have 3 week samples to get through ens of month

## 2019-03-12 NOTE — Telephone Encounter (Signed)
I attempted to contact the patient and a message was left on her home number and her daughter's mobile number for a return call.

## 2019-03-12 NOTE — Assessment & Plan Note (Signed)
Her ABIs were fairly normal although likely falsely elevated on both legs prior to the procedure.  Her ABIs remain normal.  Her digital index is basically doubled on the left leg after the procedure.  Not surprisingly, the right digital index is the same. At this point, with marked clinical improvement on the left she desires to have the procedure performed on the right which sounds very reasonable to me.  Right leg angiogram will be scheduled in the near future at her convenience.

## 2019-03-12 NOTE — Assessment & Plan Note (Signed)
Left leg significantly improved after intervention.  Now planning right leg intervention.

## 2019-03-12 NOTE — Progress Notes (Signed)
MRN : [id number]  [name] is a 85 y.o. (1929/06/07) female who presents with chief complaint of  Chief Complaint  Patient presents with  . Follow-up    ultrasound  .  History of Present Illness: Patient returns today in follow up of her PAD.  About a month ago, she underwent left tibial intervention with marked improvement in her left leg pain and discoloration.  She is extremely pleased with the results on the left leg.  Her ABIs were fairly normal although likely falsely elevated on both legs prior to the procedure.  Her ABIs remain normal.  Her digital index is basically doubled on the left leg after the procedure.  Not surprisingly, the right digital index is the same.  She had no periprocedural complications and is now desirous to have her right leg treated.  Current Outpatient Medications  Medication Sig Dispense Refill  . acetaminophen (TYLENOL) 500 MG tablet Take 1,000 mg every 8 (eight) hours as needed by mouth for moderate pain.     Marland Kitchen acyclovir (ZOVIRAX) 400 MG tablet Take 400 mg 2 (two) times daily by mouth.     Marland Kitchen amiodarone (PACERONE) 200 MG tablet TAKE 1 TABLET BY MOUTH DAILY 90 tablet 3  . aspirin EC 81 MG tablet Take 1 tablet (81 mg total) by mouth daily. 150 tablet 2  . atorvastatin (LIPITOR) 20 MG tablet TAKE ONE TABLET BY MOUTH EVERY DAY 90 tablet 2  . DULoxetine (CYMBALTA) 60 MG capsule TAKE 1 CAPSULE BY MOUTH EVERY DAY 90 capsule 1  . ELIQUIS 2.5 MG TABS tablet TAKE ONE TABLET BY MOUTH TWICE DAILY 180 tablet 1  . ergocalciferol (VITAMIN D2) 1.25 MG (50000 UT) capsule Take 50,000 Units by mouth once a week.    . furosemide (LASIX) 20 MG tablet TAKE 1 TABLET BY MOUTH DAILY 90 tablet 0  . gabapentin (NEURONTIN) 300 MG capsule TAKE 1 CAPSULE BY MOUTH THREE TIMES DAILY 270 capsule 1  . metoprolol tartrate (LOPRESSOR) 25 MG tablet Take 0.5 tablets (12.5 mg total) by mouth 2 (two) times daily. 90 tablet 2  . pantoprazole (PROTONIX) 40 MG tablet TAKE ONE TABLET BY  MOUTH EVERY DAY 90 tablet 2  . potassium chloride SA (KLOR-CON) 20 MEQ tablet     . prednisoLONE acetate (PRED FORTE) 1 % ophthalmic suspension Place 1 drop into the left eye daily.     Marland Kitchen rOPINIRole (REQUIP) 1 MG tablet TAKE TWO TABLETS BY MOUTH AT BEDTIME 180 tablet 3  . tiZANidine (ZANAFLEX) 2 MG tablet Take 2 mg by mouth every evening.     . traMADol (ULTRAM) 50 MG tablet Take 1 tablet (50 mg total) by mouth 2 (two) times daily.  0  . potassium chloride (KLOR-CON) 10 MEQ tablet Take 2 tablets (20 mEq total) by mouth daily. 60 tablet 5   No current facility-administered medications for this visit.     Past Medical History:  Diagnosis Date  . Abdominal aortic atherosclerosis ([place]) 09/2015   by xray  . Anxiety   . BCC (basal cell carcinoma of skin) 05/2011   on nose  . Breast lesion    a. diagnosed with complex sclerosing lesion with calcifications of the right breast in 08/2016  . Chest pain    a. nuclear stress test 03/2012: normal; b. 09/2018 MV: EF >65%, no isch/scar.   . DDD (degenerative disc disease), lumbosacral 12/06/1992  . Gallstones 09/2015   incidentally by xray  . GERD (gastroesophageal reflux disease) 05/1999  .  HERPES ZOSTER 04/13/2007   Qualifier: Diagnosis of  By: [name], [name]   . Hiatal hernia   . History of shingles    ophthalmic, takes acyclovir daily  . Hyperlipidemia 12/2003  . Hypertension 1990  . Mitral regurgitation    a. echo 03/2012: EF 60-65%, no RWMA, mild to mod AI/MR, mod TR, PASP 37 mmHg  . Osteoarthritis 08/21/1989  . Osteoporosis with fracture 11/1997   with compression fracture T12  . Persistent atrial fibrillation ([place]) 03/16/2012   a. CHADS2VASc => 5 (HTN, age x 2, vascular disease, sex category)-->Eliquis 5 BID;  b. Recurrent AF in 06/2016;  c. 01/2017 s/p DCCV;  d. 02/2017 recurrent AFib->Amio added->DCCV; e. 03/2017 recurrent AFib, s/p reload of amio and DCCV 04/02/2017  . Rheumatoid arthritis ([place])   . RLS (restless legs  syndrome)     Past Surgical History:  Procedure Laterality Date  . ABDOMINAL HYSTERECTOMY  Age 53 - 27   S/P TAH  . abdominal ultrasound  04/23/2004   Gallstones  . BREAST LUMPECTOMY WITH RADIOACTIVE SEED LOCALIZATION Right 10/2016   breast lumpectomy with radioactive seed localization and margin assessment for complex sclerosing lesion [facility])  . BREAST LUMPECTOMY WITH RADIOACTIVE SEED LOCALIZATION Right 11/05/2016   Procedure: RIGHT BREAST LUMPECTOMY WITH RADIOACTIVE SEED LOCALIZATION;  Surgeon: Fanny Skates, MD;  Location: [place];  Service: General;  Laterality: Right;  . CARDIOVERSION N/A 01/31/2017   Procedure: CARDIOVERSION;  Surgeon: Wellington Hampshire, MD;  Location: [facility];  Service: Cardiovascular;  Laterality: N/A;  . CARDIOVERSION N/A 03/03/2017   Procedure: CARDIOVERSION;  Surgeon: Wellington Hampshire, MD;  Location: [place] [facility];  Service: Cardiovascular;  Laterality: N/A;  . CARDIOVERSION N/A 04/02/2017   Procedure: CARDIOVERSION;  Surgeon: Wellington Hampshire, MD;  Location: [facility];  Service: Cardiovascular;  Laterality: N/A;  . CARDIOVERSION N/A 01/16/2018   Procedure: CARDIOVERSION;  Surgeon: Wellington Hampshire, MD;  Location: [facility];  Service: Cardiovascular;  Laterality: N/A;  . CATARACT EXTRACTION  05/2010   left eye  . CERVICAL DISCECTOMY  1992   Fusion (Dr. Joya Salm)  . ESI Bilateral 01/2016   S1 transforaminal ESI x3  . ESOPHAGOGASTRODUODENOSCOPY  01/01/2002   with ulcer, bx. neg; + stricture gastric ulcer with hemorrhage  . ESOPHAGOGASTRODUODENOSCOPY  04/23/2004   Esophageal stricture -- dilated  . LOWER EXTREMITY ANGIOGRAPHY Left 02/01/2019   LOWER EXTREMITY ANGIOGRAPHY;  Surgeon: Algernon Huxley, MD  . nuclear stress test  03/2012   no ischemia  . PERIPHERAL VASCULAR BALLOON ANGIOPLASTY Left 01/2019   Percutaneous transluminal angioplasty of left anterior and posterior tibial artery with 2.5 mm diameter by 22 cm length angioplasty balloon  (Curtis Cain)  . SKIN CANCER EXCISION  05/20/2011   BCC from nose  . US ECHOCARDIOGRAPHY  03/2012   in flutter, EF 60%, mild-mod aortic, mitral, tricuspid regurg, mildly dilated LA     Social History   Tobacco Use  . Smoking status: Former Smoker    Packs/day: 0.25    Years: 1.00    Pack years: 0.25    Quit date: 04/08/1976    Years since quitting: 42.9  . Smokeless tobacco: Never Used  . Tobacco comment: pt smokes occasionally "socially"  Substance Use Topics  . Alcohol use: No    Alcohol/week: 1.0 standard drinks    Types: 1 Glasses of wine per week    Frequency: Never    Comment: occassional  . Drug use: No    Family History  Problem Relation  Age of Onset  . Emphysema Mother   . Heart disease Father        MI  . Stroke Father        first at age 42.  . Stroke Sister   . Diabetes Sister   . Hypertension Brother   . Heart disease Brother        Heart Valve  . Stroke Brother   . Diabetes Brother   . Cancer Brother        esophageal  . Diabetes Brother   . Diabetes Brother   . Colon cancer Neg Hx     Allergies  Allergen Reactions  . Penicillins Rash and Other (See Comments)    Childhood Allergy Has patient had a PCN reaction causing immediate rash, facial/tongue/throat swelling, SOB or lightheadedness with hypotension: Yes Has patient had a PCN reaction causing severe rash involving mucus membranes or skin necrosis: Unknown Has patient had a PCN reaction that required hospitalization: No Has patient had a PCN reaction occurring within the last 10 years: No If all of the above answers are "NO", then may proceed with Cephalosporin use.     REVIEW OF SYSTEMS (Negative unless checked)  Constitutional: [] ?Weight loss  [] ?Fever  [] ?Chills Cardiac: [] ?Chest pain   [] ?Chest pressure   [x] ?Palpitations   [] ?Shortness of breath when laying flat   [] ?Shortness of breath at rest   [x] ?Shortness of breath with exertion. Vascular:  [x] ?Pain in legs with walking   [x] ?Pain in  legs at rest   [] ?Pain in legs when laying flat   [] ?Claudication   [] ?Pain in feet when walking  [] ?Pain in feet at rest  [] ?Pain in feet when laying flat   [] ?History of DVT   [] ?Phlebitis   [x] ?Swelling in legs   [] ?Varicose veins   [] ?Non-healing ulcers Pulmonary:   [] ?Uses home oxygen   [] ?Productive cough   [] ?Hemoptysis   [] ?Wheeze  [] ?COPD   [] ?Asthma Neurologic:  [] ?Dizziness  [] ?Blackouts   [] ?Seizures   [] ?History of stroke   [] ?History of TIA  [] ?Aphasia   [] ?Temporary blindness   [] ?Dysphagia   [] ?Weakness or numbness in arms   [x] ?Weakness or numbness in legs Musculoskeletal:  [x] ?Arthritis   [] ?Joint swelling   [] ?Joint pain   [] ?Low back pain Hematologic:  [] ?Easy bruising  [] ?Easy bleeding   [] ?Hypercoagulable state   [] ?Anemic  [] ?Hepatitis Gastrointestinal:  [] ?Blood in stool   [] ?Vomiting blood  [] ?Gastroesophageal reflux/heartburn   [] ?Abdominal pain Genitourinary:  [] ?Chronic kidney disease   [] ?Difficult urination  [] ?Frequent urination  [] ?Burning with urination   [] ?Hematuria Skin:  [] ?Rashes   [] ?Ulcers   [] ?Wounds Psychological:  [] ?History of anxiety   [] ? History of major depression.  Physical Examination  BP (!) 143/104 (BP Location: Right Arm)   Pulse 74   Resp 17   Ht 5\' 2"  (1.575 m)   Wt 140 lb (63.5 kg)   BMI 25.61 kg/m  Gen:  WD/WN, NAD Head: [place]/AT, No temporalis wasting. Ear/Nose/Throat: Hearing grossly intact, nares w/o erythema or drainage Eyes: Conjunctiva clear. Sclera non-icteric Neck: Supple.  Trachea midline Pulmonary:  Good air movement, no use of accessory muscles.  Cardiac: Irregular Vascular:  Vessel Right Left  Radial Palpable Palpable                          PT  1+ palpable  1+ palpable  DP  1+ palpable  2+ palpable   Gastrointestinal: soft, non-tender/non-distended. No guarding/reflex.  Musculoskeletal: M/S  5/5 throughout.  No deformity or atrophy.  Cyanosis still present in the right foot and improved but not resolved on the  left foot.  Trace bilateral lower extremity edema. Neurologic: Sensation grossly intact in extremities.  Symmetrical.  Speech is fluent.  Psychiatric: Judgment intact, Mood & affect appropriate for pt's clinical situation.        Labs Recent Results (from the past 2160 hour(s))  SARS CORONAVIRUS 2 (TAT 6-24 HRS) Nasopharyngeal Nasopharyngeal Swab     Status: None   Collection Time: 01/28/19 12:41 PM   Specimen: Nasopharyngeal Swab  Result Value Ref Range   SARS Coronavirus 2 NEGATIVE NEGATIVE    Comment: (NOTE) SARS-CoV-2 target nucleic acids are NOT DETECTED. The SARS-CoV-2 RNA is generally detectable in upper and lower respiratory specimens during the acute phase of infection. Negative results do not preclude SARS-CoV-2 infection, do not rule out co-infections with other pathogens, and should not be used as the sole basis for treatment or other patient management decisions. Negative results must be combined with clinical observations, patient history, and epidemiological information. The expected result is Negative. Fact Sheet for Patients: SugarRoll.be Fact Sheet for Healthcare Providers: https://www.woods-mathews.com/ This test is not yet approved or cleared by the [place] FDA and  has been authorized for detection and/or diagnosis of SARS-CoV-2 by FDA under an Emergency Use Authorization (EUA). This EUA will remain  in effect (meaning this test can be used) for the duration of the COVID-19 declaration under Section 56 4(b)(1) of the Act, 21 U.S.C. section 360bbb-3(b)(1), unless the authorization is terminated or revoked sooner. Performed at [facility], [address] [address], [place], [place] [address]   BUN     Status: None   Collection Time: 02/01/19  7:34 AM  Result Value Ref Range   BUN 16 8 - 23 mg/dL    Comment: Performed at [facility], [address] [place], [place] [address]  Creatinine, serum      Status: Abnormal   Collection Time: 02/01/19  7:34 AM  Result Value Ref Range   Creatinine, Ser 0.93 0.44 - 1.00 mg/dL   GFR calc non Af Amer 55 (L) >60 mL/min   GFR calc Af Amer >60 >60 mL/min    Comment: Performed at [facility], [address] [place], [place] [address]    Radiology No results found.  Assessment/Plan Persistent atrial fibrillation Rate controlled on Pacerone  Essential hypertension blood pressure control important in reducing the progression of atherosclerotic disease. On appropriate oral medications.  Bilateral foot pain Left leg significantly improved after intervention.  Now planning right leg intervention.  PAD (peripheral artery disease) (HCC) Her ABIs were fairly normal although likely falsely elevated on both legs prior to the procedure.  Her ABIs remain normal.  Her digital index is basically doubled on the left leg after the procedure.  Not surprisingly, the right digital index is the same. At this point, with marked clinical improvement on the left she desires to have the procedure performed on the right which sounds very reasonable to me.  Right leg angiogram will be scheduled in the near future at her convenience.    Leotis Pain, MD  03/12/2019 9:48 AM    This note was created with Dragon medical transcription system.  Any errors from dictation are purely unintentional

## 2019-03-22 ENCOUNTER — Other Ambulatory Visit
Admission: RE | Admit: 2019-03-22 | Discharge: 2019-03-22 | Disposition: A | Discharge status: Home / Self Care | Payer: Medicare Other | Source: Ambulatory Visit | Attending: Vascular Surgery | Admitting: Vascular Surgery

## 2019-03-22 ENCOUNTER — Other Ambulatory Visit: Payer: Self-pay

## 2019-03-22 DIAGNOSIS — Z01812 Encounter for preprocedural laboratory examination: Secondary | ICD-10-CM | POA: Diagnosis not present

## 2019-03-22 DIAGNOSIS — Z20828 Contact with and (suspected) exposure to other viral communicable diseases: Secondary | ICD-10-CM | POA: Insufficient documentation

## 2019-03-23 LAB — SARS CORONAVIRUS 2 (TAT 6-24 HRS): SARS Coronavirus 2: NEGATIVE

## 2019-03-25 ENCOUNTER — Ambulatory Visit
Admission: RE | Admit: 2019-03-25 | Discharge: 2019-03-25 | Disposition: A | Discharge status: Home / Self Care | Payer: Medicare Other | Attending: Vascular Surgery | Admitting: Vascular Surgery

## 2019-03-25 ENCOUNTER — Encounter
Admission: RE | Disposition: A | Discharge status: Home / Self Care | Payer: Self-pay | Source: Home / Self Care | Attending: Vascular Surgery

## 2019-03-25 ENCOUNTER — Encounter: Payer: Self-pay | Admitting: Vascular Surgery

## 2019-03-25 ENCOUNTER — Other Ambulatory Visit (INDEPENDENT_AMBULATORY_CARE_PROVIDER_SITE_OTHER): Payer: Self-pay | Admitting: Nurse Practitioner

## 2019-03-25 ENCOUNTER — Other Ambulatory Visit: Payer: Self-pay

## 2019-03-25 DIAGNOSIS — I70221 Atherosclerosis of native arteries of extremities with rest pain, right leg: Secondary | ICD-10-CM | POA: Diagnosis not present

## 2019-03-25 DIAGNOSIS — G2581 Restless legs syndrome: Secondary | ICD-10-CM | POA: Insufficient documentation

## 2019-03-25 DIAGNOSIS — M79604 Pain in right leg: Secondary | ICD-10-CM | POA: Insufficient documentation

## 2019-03-25 DIAGNOSIS — I10 Essential (primary) hypertension: Secondary | ICD-10-CM | POA: Diagnosis not present

## 2019-03-25 DIAGNOSIS — K219 Gastro-esophageal reflux disease without esophagitis: Secondary | ICD-10-CM | POA: Insufficient documentation

## 2019-03-25 DIAGNOSIS — Z87891 Personal history of nicotine dependence: Secondary | ICD-10-CM | POA: Diagnosis not present

## 2019-03-25 DIAGNOSIS — Z79899 Other long term (current) drug therapy: Secondary | ICD-10-CM | POA: Diagnosis not present

## 2019-03-25 DIAGNOSIS — Z7901 Long term (current) use of anticoagulants: Secondary | ICD-10-CM | POA: Diagnosis not present

## 2019-03-25 DIAGNOSIS — Z7982 Long term (current) use of aspirin: Secondary | ICD-10-CM | POA: Diagnosis not present

## 2019-03-25 DIAGNOSIS — I739 Peripheral vascular disease, unspecified: Secondary | ICD-10-CM | POA: Insufficient documentation

## 2019-03-25 DIAGNOSIS — F419 Anxiety disorder, unspecified: Secondary | ICD-10-CM | POA: Diagnosis not present

## 2019-03-25 DIAGNOSIS — I4819 Other persistent atrial fibrillation: Secondary | ICD-10-CM | POA: Diagnosis not present

## 2019-03-25 DIAGNOSIS — E785 Hyperlipidemia, unspecified: Secondary | ICD-10-CM | POA: Diagnosis not present

## 2019-03-25 DIAGNOSIS — M069 Rheumatoid arthritis, unspecified: Secondary | ICD-10-CM | POA: Insufficient documentation

## 2019-03-25 DIAGNOSIS — I70229 Atherosclerosis of native arteries of extremities with rest pain, unspecified extremity: Secondary | ICD-10-CM

## 2019-03-25 HISTORY — PX: LOWER EXTREMITY ANGIOGRAPHY: CATH118251

## 2019-03-25 LAB — CREATININE, SERUM
Creatinine, Ser: 0.98 mg/dL (ref 0.44–1.00)
GFR calc Af Amer: >60 mL/min (ref >60)
GFR calc non Af Amer: 52 mL/min — ABNORMAL LOW (ref >60)

## 2019-03-25 LAB — BUN: BUN: 22 mg/dL (ref 8–23)

## 2019-03-25 SURGERY — LOWER EXTREMITY ANGIOGRAPHY
Anesthesia: Moderate Sedation | Laterality: Right

## 2019-03-25 MED ORDER — CLINDAMYCIN PHOSPHATE 300 MG/50ML IV SOLN
INTRAVENOUS | Status: AC
  Filled 2019-03-25: qty 50

## 2019-03-25 MED ORDER — HYDROMORPHONE HCL 1 MG/ML IJ SOLN: 1.0000 mg | Freq: Once | INTRAMUSCULAR | Status: DC | PRN

## 2019-03-25 MED ORDER — ONDANSETRON HCL 4 MG/2ML IJ SOLN: 4.0000 mg | Freq: Four times a day (QID) | INTRAMUSCULAR | Status: DC | PRN

## 2019-03-25 MED ORDER — MIDAZOLAM HCL 2 MG/ML PO SYRP: 8.0000 mg | ORAL_SOLUTION | Freq: Once | ORAL | Status: DC | PRN

## 2019-03-25 MED ORDER — IODIXANOL 320 MG/ML IV SOLN
INTRAVENOUS | Status: DC | PRN
  Administered 2019-03-25: 60 mL via INTRA_ARTERIAL

## 2019-03-25 MED ORDER — HEPARIN SODIUM (PORCINE) 1000 UNIT/ML IJ SOLN
INTRAMUSCULAR | Status: AC
  Filled 2019-03-25: qty 1

## 2019-03-25 MED ORDER — MIDAZOLAM HCL 5 MG/5ML IJ SOLN
INTRAMUSCULAR | Status: AC
  Filled 2019-03-25: qty 5

## 2019-03-25 MED ORDER — CLINDAMYCIN PHOSPHATE 300 MG/50ML IV SOLN
300.0000 mg | Freq: Once | INTRAVENOUS | Status: AC
  Administered 2019-03-25: 300 mg via INTRAVENOUS

## 2019-03-25 MED ORDER — NITROGLYCERIN 1 MG/10 ML FOR IR/CATH LAB
INTRA_ARTERIAL | Status: DC | PRN
  Administered 2019-03-25: 200 ug via INTRA_ARTERIAL

## 2019-03-25 MED ORDER — SODIUM CHLORIDE 0.9 % IV SOLN
INTRAVENOUS | Status: DC
  Administered 2019-03-25: 08:00:00 1000 mL via INTRAVENOUS

## 2019-03-25 MED ORDER — HEPARIN SODIUM (PORCINE) 1000 UNIT/ML IJ SOLN
INTRAMUSCULAR | Status: DC | PRN
  Administered 2019-03-25: 4000 [IU] via INTRAVENOUS

## 2019-03-25 MED ORDER — HYDRALAZINE HCL 20 MG/ML IJ SOLN
INTRAMUSCULAR | Status: AC
  Filled 2019-03-25: qty 1

## 2019-03-25 MED ORDER — FENTANYL CITRATE (PF) 100 MCG/2ML IJ SOLN
INTRAMUSCULAR | Status: AC
  Filled 2019-03-25: qty 2

## 2019-03-25 MED ORDER — MIDAZOLAM HCL 2 MG/2ML IJ SOLN
INTRAMUSCULAR | Status: DC | PRN
  Administered 2019-03-25: 1 mg via INTRAVENOUS
  Administered 2019-03-25: 2 mg via INTRAVENOUS

## 2019-03-25 MED ORDER — FAMOTIDINE 20 MG PO TABS: 40.0000 mg | ORAL_TABLET | Freq: Once | ORAL | Status: DC | PRN

## 2019-03-25 MED ORDER — HYDRALAZINE HCL 20 MG/ML IJ SOLN
10.0000 mg | Freq: Once | INTRAMUSCULAR | Status: AC
  Administered 2019-03-25: 09:00:00 10 mg via INTRAVENOUS

## 2019-03-25 MED ORDER — FENTANYL CITRATE (PF) 100 MCG/2ML IJ SOLN
INTRAMUSCULAR | Status: DC | PRN
  Administered 2019-03-25: 25 ug via INTRAVENOUS
  Administered 2019-03-25: 50 ug via INTRAVENOUS

## 2019-03-25 MED ORDER — METHYLPREDNISOLONE SODIUM SUCC 125 MG IJ SOLR: 125.0000 mg | Freq: Once | INTRAMUSCULAR | Status: DC | PRN

## 2019-03-25 MED ORDER — DIPHENHYDRAMINE HCL 50 MG/ML IJ SOLN: 50.0000 mg | Freq: Once | INTRAMUSCULAR | Status: DC | PRN

## 2019-03-25 SURGICAL SUPPLY — 18 items
BALLN ULTRVRSE 2.5X220X150 (BALLOONS) ×3
BALLN ULTRVRSE 2X220X150 (BALLOONS) ×3
BALLOON ULTRVRSE 2.5X220X150 (BALLOONS) IMPLANT
BALLOON ULTRVRSE 2X220X150 (BALLOONS) IMPLANT
CATH BEACON 5 .035 65 RIM TIP (CATHETERS) ×2 IMPLANT
CATH CXI SUPP ANG 4FR 135 (CATHETERS) IMPLANT
CATH CXI SUPP ANG 4FR 135CM (CATHETERS) ×3
CATH PIG 70CM (CATHETERS) ×2 IMPLANT
DEVICE PRESTO INFLATION (MISCELLANEOUS) ×2 IMPLANT
DEVICE STARCLOSE SE CLOSURE (Vascular Products) ×2 IMPLANT
GLIDEWIRE ADV .035X260CM (WIRE) ×2 IMPLANT
PACK ANGIOGRAPHY (CUSTOM PROCEDURE TRAY) ×3 IMPLANT
SHEATH ANL 5FRX90 (SHEATH) ×2 IMPLANT
SHEATH BRITE TIP 5FRX11 (SHEATH) ×2 IMPLANT
SYR MEDRAD MARK 7 150ML (SYRINGE) ×2 IMPLANT
TUBING CONTRAST HIGH PRESS 72 (TUBING) ×2 IMPLANT
WIRE G V18X300CM (WIRE) ×2 IMPLANT
WIRE J 3MM .035X145CM (WIRE) ×2 IMPLANT

## 2019-03-25 NOTE — Op Note (Signed)
[facility]  Percutaneous Study/Intervention Procedural Note   Date of Surgery: 03/25/2019  Surgeon(s):Gola Bribiesca    Assistants:none  Pre-operative Diagnosis: PAD with rest pain right lower extremity  Post-operative diagnosis:  Same  Procedure(s) Performed:             1.  Ultrasound guidance for vascular access left femoral artery             2.  Catheter placement into right posterior tibial, peroneal, and anterior tibial arteries from left femoral approach             3.  Selective right lower extremity angiogram             4.  Percutaneous transluminal angioplasty of right posterior tibial artery with 2 mm diameter angioplasty balloon in the foot and ankle and 2.5 mm diameter angioplasty balloon throughout the rest of the posterior tibial artery             5.   Percutaneous transluminal angioplasty of the right peroneal artery with 2 inflations with a 2.5 mm diameter by 22 cm length angioplasty balloon  6.  Percutaneous transluminal angioplasty of the right distal anterior tibial artery and dorsalis pedis artery with 2 mm diameter angioplasty balloon             7.  StarClose closure device left femoral artery  EBL: 5 cc  Contrast: 60 cc  Fluoro Time: 12.1 minutes  Moderate Conscious Sedation Time: approximately 35 minutes using 3 mg of Versed and 75 mcg of Fentanyl              Indications:  Patient is a 85 y.o.female with severe pain in the right leg.  She had previous issues on the left leg and improved significantly after intervention. The patient has noninvasive study showing relatively normal ABIs but reduced digital pressure. The patient is brought in for angiography for further evaluation and potential treatment.  Due to the limb threatening nature of the situation, angiogram was performed for attempted limb salvage. The patient is aware that if the procedure fails, amputation would be expected.  The patient also understands that even with  successful revascularization, amputation may still be required due to the severity of the situation.  Risks and benefits are discussed and informed consent is obtained.   Procedure:  The patient was identified and appropriate procedural time out was performed.  The patient was then placed supine on the table and prepped and draped in the usual sterile fashion. Moderate conscious sedation was administered during a face to face encounter with the patient throughout the procedure with my supervision of the RN administering medicines and monitoring the patient's vital signs, pulse oximetry, telemetry and mental status throughout from the start of the procedure until the patient was taken to the recovery room. Ultrasound was used to evaluate the left common femoral artery.  It was patent .  A digital ultrasound image was acquired.  A Seldinger needle was used to access the left common femoral artery under direct ultrasound guidance and a permanent image was performed.  A 0.035 J wire was advanced without resistance and a 5Fr sheath was placed.   Aortogram was not necessary as one was recently performed.  I then crossed the aortic bifurcation and advanced to the right femoral head. Selective right lower extremity angiogram was then performed. This demonstrated Normal common femoral artery, profunda femoris artery, superficial femoral artery, and popliteal artery without hemodynamically significant stenosis.  There  is a normal tibial trifurcation but there was disease in all 3 tibial vessels.  This was difficult to see from the initial imaging was better seen on selective imaging after catheter placement and all 3 arteries.  The left posterior tibial artery had a proximal stenosis in the 80% range, moderate stenosis in the midsegment, and then occlusion in the distal segment with reconstitution in the foot.  The peroneal artery had a high-grade stenosis in the midsegment in the 80% range and another high-grade stenosis  in the 80% range in the distal segment.  The anterior tibial artery was continuous down to the distal leg and ankle where there was a 70 to 80% stenosis in the proximal dorsalis pedis artery at the ankle and into the foot. It was felt that it was in the patient's best interest to proceed with intervention after these images to avoid a second procedure and a larger amount of contrast and fluoroscopy based off of the findings from the initial angiogram. The patient was systemically heparinized and a 5 French 90 cm sheath was then placed over the Terumo Advantage wire. I then used a Kumpe catheter and the advantage wire to navigate into the posterior tibial artery and exchanged for a CXI catheter where selective imaging was performed.  I then crossed the lesions with a 0.018 wire parking this in the midfoot.  Angioplasty was then performed in the lower leg, foot and ankle with a 2 mm diameter by 22 cm length angioplasty balloon inflated to 12 atm for 1 minute.  For the proximal and mid posterior tibial artery a 2.5 mm diameter by 22 cm length angioplasty balloon was inflated to 12 atm for 1 minute.  I then redirected the CXI catheter and the wire down into the peroneal artery where selective imaging showed lesions.  I then used the 2.5 mm diameter by 22 cm length angioplasty balloon inflated twice up to 10 to 12 atm throughout the peroneal artery with resolution of the waist at that level.  I then turned my attention to the anterior tibial artery.  Using the V 18 wire and the CXI catheter was able to gain access to the anterior tibial artery and perform selective imaging with the CXI catheter.  I then crossed the lesion parking the wire almost to the toes.  The 2 mm diameter by 22 cm length angioplasty balloon was inflated in the dorsalis pedis artery in the foot ankle and the distal anterior tibial artery in the lower leg.  It was taken 8 atm for 1 minute.  After intra-arterial nitroglycerin to break spasm, imaging  was performed through the sheath for completion.  All 3 vessels were markedly improved with less than 20% residual stenosis in the posterior tibial artery and this now appeared to be the best runoff to the foot.  The peroneal artery had less than 20% residual stenosis in the areas treated although the flow was much more sluggish now that the anterior tibial and posterior tibial arteries were providing flow to the foot.  The anterior tibial artery had a less than 25% residual lesion after angioplasty. I elected to terminate the procedure. The sheath was removed and StarClose closure device was deployed in the left femoral artery with excellent hemostatic result. The patient was taken to the recovery room in stable condition having tolerated the procedure well.  Findings:  Right Lower Extremity:  Normal common femoral artery, profunda femoris artery, superficial femoral artery, and popliteal artery without hemodynamically significant stenosis.  There is a normal tibial trifurcation but there was disease in all 3 tibial vessels.  This was difficult to see from the initial imaging was better seen on selective imaging after catheter placement and all 3 arteries.  The left posterior tibial artery had a proximal stenosis in the 80% range, moderate stenosis in the midsegment, and then occlusion in the distal segment with reconstitution in the foot.  The peroneal artery had a high-grade stenosis in the midsegment in the 80% range and another high-grade stenosis in the 80% range in the distal segment.  The anterior tibial artery was continuous down to the distal leg and ankle where there was a 70 to 80% stenosis in the proximal dorsalis pedis artery at the ankle and into the foot.   Disposition: Patient was taken to the recovery room in stable condition having tolerated the procedure well.  Complications: None  Leotis Pain 03/25/2019 9:15 AM   This note was created with Dragon Medical  transcription system. Any errors in dictation are purely unintentional.

## 2019-03-25 NOTE — H&P (Signed)
[facility] History & Physical Update  The patient was interviewed and re-examined.  The patient's previous History and Physical has been reviewed and is unchanged.  There is no change in the plan of care. We plan to proceed with the scheduled procedure.  Leotis Pain, MD  03/25/2019, 8:13 AM

## 2019-03-31 ENCOUNTER — Other Ambulatory Visit: Payer: Self-pay

## 2019-03-31 ENCOUNTER — Ambulatory Visit (INDEPENDENT_AMBULATORY_CARE_PROVIDER_SITE_OTHER): Payer: Medicare Other

## 2019-03-31 DIAGNOSIS — M8000XD Age-related osteoporosis with current pathological fracture, unspecified site, subsequent encounter for fracture with routine healing: Secondary | ICD-10-CM | POA: Diagnosis not present

## 2019-03-31 MED ORDER — DENOSUMAB 60 MG/ML ~~LOC~~ SOSY
60.0000 mg | PREFILLED_SYRINGE | Freq: Once | SUBCUTANEOUS | Status: AC
  Administered 2019-03-31: 15:00:00 60 mg via SUBCUTANEOUS

## 2019-04-05 ENCOUNTER — Other Ambulatory Visit (INDEPENDENT_AMBULATORY_CARE_PROVIDER_SITE_OTHER): Payer: Self-pay | Admitting: Vascular Surgery

## 2019-04-05 ENCOUNTER — Telehealth (INDEPENDENT_AMBULATORY_CARE_PROVIDER_SITE_OTHER): Payer: Self-pay

## 2019-04-05 ENCOUNTER — Other Ambulatory Visit: Payer: Self-pay

## 2019-04-05 ENCOUNTER — Ambulatory Visit (INDEPENDENT_AMBULATORY_CARE_PROVIDER_SITE_OTHER): Payer: Medicare Other

## 2019-04-05 DIAGNOSIS — Z9862 Peripheral vascular angioplasty status: Secondary | ICD-10-CM | POA: Diagnosis not present

## 2019-04-05 NOTE — Telephone Encounter (Signed)
Patient's daughter called stating the patient had a lower extremity angio with Dr. Lucky Cowboy on 03/25/2019 and has since had severe pain in the right leg with a lesion on the right foot. Patient right  foot and leg is swelling with redness with tenderness to touch.

## 2019-04-05 NOTE — Telephone Encounter (Signed)
Patient will come in today for an ABI at our office per Dr. Lucky Cowboy.

## 2019-04-10 NOTE — Progress Notes (Signed)
Noted  

## 2019-04-12 NOTE — H&P (Signed)
See office note from 12/4

## 2019-04-21 ENCOUNTER — Other Ambulatory Visit (INDEPENDENT_AMBULATORY_CARE_PROVIDER_SITE_OTHER): Payer: Self-pay | Admitting: Vascular Surgery

## 2019-04-21 ENCOUNTER — Other Ambulatory Visit (INDEPENDENT_AMBULATORY_CARE_PROVIDER_SITE_OTHER): Payer: Self-pay | Admitting: Nurse Practitioner

## 2019-04-21 ENCOUNTER — Telehealth (INDEPENDENT_AMBULATORY_CARE_PROVIDER_SITE_OTHER): Payer: Self-pay

## 2019-04-21 DIAGNOSIS — Z9862 Peripheral vascular angioplasty status: Secondary | ICD-10-CM

## 2019-04-21 DIAGNOSIS — I70221 Atherosclerosis of native arteries of extremities with rest pain, right leg: Secondary | ICD-10-CM

## 2019-04-21 NOTE — Telephone Encounter (Signed)
Discussed with Kela, patient will keep current spot

## 2019-04-23 ENCOUNTER — Ambulatory Visit (INDEPENDENT_AMBULATORY_CARE_PROVIDER_SITE_OTHER): Payer: Medicare Other | Admitting: Nurse Practitioner

## 2019-04-23 ENCOUNTER — Ambulatory Visit (INDEPENDENT_AMBULATORY_CARE_PROVIDER_SITE_OTHER): Payer: Medicare Other

## 2019-04-23 ENCOUNTER — Other Ambulatory Visit: Payer: Self-pay

## 2019-04-23 ENCOUNTER — Encounter (INDEPENDENT_AMBULATORY_CARE_PROVIDER_SITE_OTHER): Payer: Self-pay | Admitting: Nurse Practitioner

## 2019-04-23 VITALS — BP 178/77 | HR 64 | Resp 16 | Wt 137.0 lb

## 2019-04-23 DIAGNOSIS — I70221 Atherosclerosis of native arteries of extremities with rest pain, right leg: Secondary | ICD-10-CM

## 2019-04-23 DIAGNOSIS — I10 Essential (primary) hypertension: Secondary | ICD-10-CM

## 2019-04-23 DIAGNOSIS — Z9862 Peripheral vascular angioplasty status: Secondary | ICD-10-CM | POA: Diagnosis not present

## 2019-04-23 DIAGNOSIS — I739 Peripheral vascular disease, unspecified: Secondary | ICD-10-CM

## 2019-04-23 DIAGNOSIS — E782 Mixed hyperlipidemia: Secondary | ICD-10-CM | POA: Diagnosis not present

## 2019-04-27 ENCOUNTER — Other Ambulatory Visit: Payer: Self-pay | Admitting: Family Medicine

## 2019-04-27 ENCOUNTER — Encounter (INDEPENDENT_AMBULATORY_CARE_PROVIDER_SITE_OTHER): Payer: Self-pay | Admitting: Nurse Practitioner

## 2019-04-27 DIAGNOSIS — I4819 Other persistent atrial fibrillation: Secondary | ICD-10-CM

## 2019-04-27 DIAGNOSIS — M8000XD Age-related osteoporosis with current pathological fracture, unspecified site, subsequent encounter for fracture with routine healing: Secondary | ICD-10-CM

## 2019-04-27 DIAGNOSIS — E538 Deficiency of other specified B group vitamins: Secondary | ICD-10-CM

## 2019-04-27 DIAGNOSIS — E559 Vitamin D deficiency, unspecified: Secondary | ICD-10-CM

## 2019-04-27 DIAGNOSIS — E782 Mixed hyperlipidemia: Secondary | ICD-10-CM

## 2019-04-27 DIAGNOSIS — I10 Essential (primary) hypertension: Secondary | ICD-10-CM

## 2019-04-27 NOTE — Progress Notes (Signed)
SUBJECTIVE:  Patient ID: [name], female    DOB: [birth year], 86 y.o.   MRN: [id number] Chief Complaint  Patient presents with  . Follow-up    [facility] 4week ultrasound follow up    HPI  [name] is a 86 y.o. female The patient returns to the office for followup and review status post angiogram with intervention. The patient notes improvement in the lower extremity symptoms. No interval shortening of the patient's claudication distance or rest pain symptoms.  No new ulcers or wounds have occurred since the last visit.  There have been no significant changes to the patient's overall health care.  The patient denies amaurosis fugax or recent TIA symptoms. There are no recent neurological changes noted. The patient denies history of DVT, PE or superficial thrombophlebitis. The patient denies recent episodes of angina or shortness of breath.   ABI's Rt=1.12 and Lt=1.09  (previous ABI's Rt=1.07 and Lt=1.07) Duplex US of the bilateral tibial arteries shows biphasic/triphasic waveforms with dampened toe waveforms bilaterally.  The right great toe pressures are 88 with the left great toe pressure of 40.  Past Medical History:  Diagnosis Date  . Abdominal aortic atherosclerosis ([place]) 09/2015   by xray  . Anxiety   . BCC (basal cell carcinoma of skin) 05/2011   on nose  . Breast lesion    a. diagnosed with complex sclerosing lesion with calcifications of the right breast in 08/2016  . Chest pain    a. nuclear stress test 03/2012: normal; b. 09/2018 MV: EF >65%, no isch/scar.   . DDD (degenerative disc disease), lumbosacral 12/06/1992  . Gallstones 09/2015   incidentally by xray  . GERD (gastroesophageal reflux disease) 05/1999  . HERPES ZOSTER 04/13/2007   Qualifier: Diagnosis of  By: [name], [name]   . Hiatal hernia   . History of shingles    ophthalmic, takes acyclovir daily  . Hyperlipidemia 12/2003  . Hypertension 1990  . Mitral regurgitation    a. echo  03/2012: EF 60-65%, no RWMA, mild to mod AI/MR, mod TR, PASP 37 mmHg  . Osteoarthritis 08/21/1989  . Osteoporosis with fracture 11/1997   with compression fracture T12  . Persistent atrial fibrillation ([place]) 03/16/2012   a. CHADS2VASc => 5 (HTN, age x 2, vascular disease, sex category)-->Eliquis 5 BID;  b. Recurrent AF in 06/2016;  c. 01/2017 s/p DCCV;  d. 02/2017 recurrent AFib->Amio added->DCCV; e. 03/2017 recurrent AFib, s/p reload of amio and DCCV 04/02/2017  . Rheumatoid arthritis ([place])   . RLS (restless legs syndrome)     Past Surgical History:  Procedure Laterality Date  . ABDOMINAL HYSTERECTOMY  Age 65 - 62   S/P TAH  . abdominal ultrasound  04/23/2004   Gallstones  . BREAST LUMPECTOMY WITH RADIOACTIVE SEED LOCALIZATION Right 10/2016   breast lumpectomy with radioactive seed localization and margin assessment for complex sclerosing lesion [facility])  . BREAST LUMPECTOMY WITH RADIOACTIVE SEED LOCALIZATION Right 11/05/2016   Procedure: RIGHT BREAST LUMPECTOMY WITH RADIOACTIVE SEED LOCALIZATION;  Surgeon: Fanny Skates, MD;  Location: [facility];  Service: General;  Laterality: Right;  . CARDIOVERSION N/A 01/31/2017   Procedure: CARDIOVERSION;  Surgeon: Wellington Hampshire, MD;  Location: [facility];  Service: Cardiovascular;  Laterality: N/A;  . CARDIOVERSION N/A 03/03/2017   Procedure: CARDIOVERSION;  Surgeon: Wellington [place], MD;  Location: [facility];  Service: Cardiovascular;  Laterality: N/A;  . CARDIOVERSION N/A 04/02/2017   Procedure: CARDIOVERSION;  Surgeon: Wellington Hampshire, MD;  Location: [facility];  Service: Cardiovascular;  Laterality: N/A;  . CARDIOVERSION N/A 01/16/2018   Procedure: CARDIOVERSION;  Surgeon: Wellington Hampshire, MD;  Location: [facility];  Service: Cardiovascular;  Laterality: N/A;  . CATARACT EXTRACTION  05/2010   left eye  . CERVICAL DISCECTOMY  1992   Fusion (Dr. Joya Salm)  . ESI Bilateral 01/2016   S1 transforaminal ESI x3  .  ESOPHAGOGASTRODUODENOSCOPY  01/01/2002   with ulcer, bx. neg; + stricture gastric ulcer with hemorrhage  . ESOPHAGOGASTRODUODENOSCOPY  04/23/2004   Esophageal stricture -- dilated  . LOWER EXTREMITY ANGIOGRAPHY Left 02/01/2019   LOWER EXTREMITY ANGIOGRAPHY;  Surgeon: Algernon Huxley, MD  . LOWER EXTREMITY ANGIOGRAPHY Right 03/25/2019   Procedure: LOWER EXTREMITY ANGIOGRAPHY;  Surgeon: Algernon Huxley, MD;  Location: [facility];  Service: Cardiovascular;  Laterality: Right;  . nuclear stress test  03/2012   no ischemia  . PERIPHERAL VASCULAR BALLOON ANGIOPLASTY Left 01/2019   Percutaneous transluminal angioplasty of left anterior and posterior tibial artery with 2.5 mm diameter by 22 cm length angioplasty balloon ([place])  . SKIN CANCER EXCISION  05/20/2011   BCC from nose  . US ECHOCARDIOGRAPHY  03/2012   in [place], EF 60%, mild-mod aortic, mitral, tricuspid regurg, mildly dilated LA    Social History   Socioeconomic History  . Marital status: Widowed    Spouse name: Not on file  . Number of children: 5  . Years of education: Not on file  . Highest education level: Not on file  Occupational History  . Occupation: Retired - Occupational psychologist: RETIRED  Tobacco Use  . Smoking status: Former Smoker    Packs/day: 0.25    Years: 1.00    Pack years: 0.25    Quit date: 04/08/1976    Years since quitting: 43.0  . Smokeless tobacco: Never Used  . Tobacco comment: pt smokes occasionally "socially"  Substance and Sexual Activity  . Alcohol use: No    Alcohol/week: 1.0 standard drinks    Types: 1 Glasses of wine per week    Comment: occassional  . Drug use: No  . Sexual activity: Never  Other Topics Concern  . Not on file  Social History Narrative   Lives with grandson who smokes, dog   From [place].  Widowed 01/1999; 1st marriage 25 years / 2nd marriage 13 years.   One son deceased 01/28/2023.   Activity: active with dog   Diet: good water, fruits/vegetables daily   Social  Determinants of Health   Financial Resource Strain:   . Difficulty of Paying Living Expenses: Not on file  Food Insecurity:   . Worried About Charity fundraiser in the Last Year: Not on file  . Ran Out of Food in the Last Year: Not on file  Transportation Needs:   . Lack of Transportation (Medical): Not on file  . Lack of Transportation (Non-Medical): Not on file  Physical Activity:   . Days of Exercise per Week: Not on file  . Minutes of Exercise per Session: Not on file  Stress:   . Feeling of Stress : Not on file  Social Connections:   . Frequency of Communication with Friends and Family: Not on file  . Frequency of Social Gatherings with Friends and Family: Not on file  . Attends Religious Services: Not on file  . Active Member of Clubs or Organizations: Not on file  . Attends Archivist Meetings: Not on file  . Marital  Status: Not on file  Intimate Partner Violence:   . Fear of Current or Ex-Partner: Not on file  . Emotionally Abused: Not on file  . Physically Abused: Not on file  . Sexually Abused: Not on file    Family History  Problem Relation Age of Onset  . Emphysema Mother   . Heart disease Father        MI  . Stroke Father        first at age 61.  . Stroke Sister   . Diabetes Sister   . Hypertension Brother   . Heart disease Brother        Heart Valve  . Stroke Brother   . Diabetes Brother   . Cancer Brother        esophageal  . Diabetes Brother   . Diabetes Brother   . Colon cancer Neg Hx     Allergies  Allergen Reactions  . Penicillins Rash and Other (See Comments)    Childhood Allergy Has patient had a PCN reaction causing immediate rash, facial/tongue/throat swelling, SOB or lightheadedness with hypotension: Yes Has patient had a PCN reaction causing severe rash involving mucus membranes or skin necrosis: Unknown Has patient had a PCN reaction that required hospitalization: No Has patient had a PCN reaction occurring within the last  10 years: No If all of the above answers are "NO", then may proceed with Cephalosporin use.      Review of Systems   Review of Systems: Negative Unless Checked Constitutional: [] Weight loss  [] Fever  [] Chills Cardiac: [] Chest pain   [x]  Atrial Fibrillation  [] Palpitations   [] Shortness of breath when laying flat   [] Shortness of breath with exertion. [] Shortness of breath at rest Vascular:  [] Pain in legs with walking   [] Pain in legs with standing [] Pain in legs when laying flat   [] Claudication    [] Pain in feet when laying flat    [] History of DVT   [] Phlebitis   [] Swelling in legs   [] Varicose veins   [] Non-healing ulcers Pulmonary:   [] Uses home oxygen   [] Productive cough   [] Hemoptysis   [] Wheeze  [] COPD   [] Asthma Neurologic:  [] Dizziness   [] Seizures  [] Blackouts [] History of stroke   [] History of TIA  [] Aphasia   [] Temporary Blindness   [] Weakness or numbness in arm   [x] Weakness or numbness in leg Musculoskeletal:   [] Joint swelling   [] Joint pain   [] Low back pain  []  History of Knee Replacement [x] Arthritis [] back Surgeries  []  Spinal Stenosis    Hematologic:  [] Easy bruising  [] Easy bleeding   [] Hypercoagulable state   [] Anemic Gastrointestinal:  [] Diarrhea   [] Vomiting  [x] Gastroesophageal reflux/heartburn   [] Difficulty swallowing. [] Abdominal pain Genitourinary:  [] Chronic kidney disease   [] Difficult urination  [] Anuric   [] Blood in urine [] Frequent urination  [] Burning with urination   [] Hematuria Skin:  [] Rashes   [] Ulcers [] Wounds Psychological:  [] History of anxiety   []  History of major depression  []  Memory Difficulties      OBJECTIVE:   Physical Exam  BP (!) 178/77 (BP Location: Left Arm)   Pulse 64   Resp 16   Wt 137 lb (62.1 kg)   BMI 25.06 kg/m   Gen: WD/WN, NAD Head: [place]/AT, No temporalis wasting.  Ear/Nose/Throat: Hearing grossly intact, nares w/o erythema or drainage Eyes: PER, EOMI, sclera nonicteric.  Neck: Supple, no masses.  No JVD.  Pulmonary:   Good air movement, no use of accessory muscles.  Cardiac: Irregular  Vascular: good capillary refill, minimal edema  Vessel Right Left  Dorsalis Pedis Trace Palpable Trace Palpable  Posterior Tibial Trace Palpable Trace Palpable   Gastrointestinal: soft, non-distended. No guarding/no peritoneal signs.  Musculoskeletal: M/S 5/5 throughout.  No deformity or atrophy.  Neurologic: Pain and light touch intact in extremities.  Symmetrical.  Speech is fluent. Motor exam as listed above. Psychiatric: Judgment intact, Mood & affect appropriate for pt's clinical situation. Dermatologic: No Venous rashes. No Ulcers Noted.  No changes consistent with cellulitis. Lymph : No Cervical lymphadenopathy, no lichenification or skin changes of chronic lymphedema.       ASSESSMENT AND PLAN:  1. PAD (peripheral artery disease) (HCC) Today the pain discomfort that the patient had in her bilateral lower extremities is lessened.  She still continues to have some discoloration of her bilateral feet however this may be more of a relation to microvascular disease in addition to some delayed venous return.  Patient still continues to have palpable pulses as well as a good capillary refill.  We will have the patient return to the office in 3 months to repeat noninvasive studies however if the patient begins to experience more pain and discomfort or new lower extremity wounds she should contact her office for sooner follow-up visit.  Otherwise the patient continue to elevate her lower extremities as possible.  She should also continue to exercise as well.  Patient also has a painful callus on her right lower extremity.  Patient is advised that if podiatry decides to debride or shave that area patient continue with close follow-up with [place] to ensure that the wound has adequate blood flow to heal.  2. Essential hypertension Blood pressure slightly elevated today however discomfort from the ultrasound table may be to blame.   Currently patient is on appropriate medication.  No changes made today.  3. Mixed hyperlipidemia Good lipid control is essential to controlling atherosclerotic disease progression.  Patient on appropriate medications.  No changes made today.   Current Outpatient Medications on File Prior to Visit  Medication Sig Dispense Refill  . acetaminophen (TYLENOL) 500 MG tablet Take 1,000 mg every 8 (eight) hours as needed by mouth for moderate pain.     Marland Kitchen acyclovir (ZOVIRAX) 400 MG tablet Take 400 mg 2 (two) times daily by mouth.     Marland Kitchen amiodarone (PACERONE) 200 MG tablet TAKE 1 TABLET BY MOUTH DAILY 90 tablet 3  . aspirin EC 81 MG tablet Take 1 tablet (81 mg total) by mouth daily. 150 tablet 2  . atorvastatin (LIPITOR) 20 MG tablet TAKE ONE TABLET BY MOUTH EVERY DAY 90 tablet 2  . DULoxetine (CYMBALTA) 60 MG capsule TAKE 1 CAPSULE BY MOUTH EVERY DAY 90 capsule 1  . ELIQUIS 2.5 MG TABS tablet TAKE ONE TABLET BY MOUTH TWICE DAILY 180 tablet 1  . ergocalciferol (VITAMIN D2) 1.25 MG (50000 UT) capsule Take 50,000 Units by mouth once a week.    . gabapentin (NEURONTIN) 300 MG capsule TAKE 1 CAPSULE BY MOUTH THREE TIMES DAILY 270 capsule 1  . metoprolol tartrate (LOPRESSOR) 25 MG tablet Take 0.5 tablets (12.5 mg total) by mouth 2 (two) times daily. 90 tablet 2  . pantoprazole (PROTONIX) 40 MG tablet TAKE ONE TABLET BY MOUTH EVERY DAY 90 tablet 2  . potassium chloride SA (KLOR-CON) 20 MEQ tablet     . prednisoLONE acetate (PRED FORTE) 1 % ophthalmic suspension Place 1 drop into the left eye daily.     Marland Kitchen rOPINIRole (REQUIP) 1 MG  tablet TAKE TWO TABLETS BY MOUTH AT BEDTIME 180 tablet 3  . tiZANidine (ZANAFLEX) 2 MG tablet Take 2 mg by mouth every evening.     . traMADol (ULTRAM) 50 MG tablet Take 1 tablet (50 mg total) by mouth 2 (two) times daily.  0  . furosemide (LASIX) 20 MG tablet TAKE 1 TABLET BY MOUTH DAILY (Patient not taking: TAKE 1 TABLET BY MOUTH DAILY) 90 tablet 0  . potassium chloride (KLOR-CON)  10 MEQ tablet Take 2 tablets (20 mEq total) by mouth daily. 60 tablet 5   No current facility-administered medications on file prior to visit.    There are no Patient Instructions on file for this visit. No follow-ups on file.   Kris Hartmann, NP  This note was completed with Sales executive.  Any errors are purely unintentional.

## 2019-04-28 ENCOUNTER — Other Ambulatory Visit: Payer: Self-pay

## 2019-04-28 ENCOUNTER — Encounter: Payer: Medicare Other | Admitting: Family Medicine

## 2019-04-28 ENCOUNTER — Ambulatory Visit: Payer: Medicare Other

## 2019-04-28 ENCOUNTER — Ambulatory Visit (INDEPENDENT_AMBULATORY_CARE_PROVIDER_SITE_OTHER): Payer: Medicare Other

## 2019-04-28 ENCOUNTER — Other Ambulatory Visit (INDEPENDENT_AMBULATORY_CARE_PROVIDER_SITE_OTHER): Payer: Medicare Other

## 2019-04-28 DIAGNOSIS — I4819 Other persistent atrial fibrillation: Secondary | ICD-10-CM

## 2019-04-28 DIAGNOSIS — Z0000 Encounter for general adult medical examination without abnormal findings: Secondary | ICD-10-CM | POA: Diagnosis not present

## 2019-04-28 DIAGNOSIS — E559 Vitamin D deficiency, unspecified: Secondary | ICD-10-CM

## 2019-04-28 DIAGNOSIS — E538 Deficiency of other specified B group vitamins: Secondary | ICD-10-CM

## 2019-04-28 DIAGNOSIS — E782 Mixed hyperlipidemia: Secondary | ICD-10-CM

## 2019-04-28 DIAGNOSIS — I10 Essential (primary) hypertension: Secondary | ICD-10-CM | POA: Diagnosis not present

## 2019-04-28 LAB — CBC WITH DIFFERENTIAL/PLATELET
Basophils Absolute: 0.1 10*3/uL (ref 0.0–0.1)
Basophils Relative: 1 % (ref 0.0–3.0)
Eosinophils Absolute: 0.1 10*3/uL (ref 0.0–0.7)
Eosinophils Relative: 1.7 % (ref 0.0–5.0)
HCT: 38.9 % (ref 36.0–46.0)
Hemoglobin: 13 g/dL (ref 12.0–15.0)
Lymphocytes Relative: 19.2 % (ref 12.0–46.0)
Lymphs Abs: 1.2 10*3/uL (ref 0.7–4.0)
MCHC: 33.4 g/dL (ref 30.0–36.0)
MCV: 99.2 fl (ref 78.0–100.0)
Monocytes Absolute: 0.6 10*3/uL (ref 0.1–1.0)
Monocytes Relative: 9.7 % (ref 3.0–12.0)
Neutro Abs: 4.1 10*3/uL (ref 1.4–7.7)
Neutrophils Relative %: 68.4 % (ref 43.0–77.0)
Platelets: 252 10*3/uL (ref 150.0–400.0)
RBC: 3.92 Mil/uL (ref 3.87–5.11)
RDW: 14.4 % (ref 11.5–15.5)
WBC: 6.1 10*3/uL (ref 4.0–10.5)

## 2019-04-28 LAB — LIPID PANEL
Cholesterol: 134 mg/dL (ref 0–200)
HDL: 67.2 mg/dL (ref >39.00)
LDL Cholesterol: 50 mg/dL (ref 0–99)
NonHDL: 67.05
Total CHOL/HDL Ratio: 2
Triglycerides: 85 mg/dL (ref 0.0–149.0)
VLDL: 17 mg/dL (ref 0.0–40.0)

## 2019-04-28 LAB — VITAMIN B12: Vitamin B-12: 144 pg/mL — ABNORMAL LOW (ref 211–911)

## 2019-04-28 LAB — COMPREHENSIVE METABOLIC PANEL
ALT: 19 U/L (ref 0–35)
AST: 19 U/L (ref 0–37)
Albumin: 4 g/dL (ref 3.5–5.2)
Alkaline Phosphatase: 58 U/L (ref 39–117)
BUN: 16 mg/dL (ref 6–23)
CO2: 30 mEq/L (ref 19–32)
Calcium: 8.8 mg/dL (ref 8.4–10.5)
Chloride: 102 mEq/L (ref 96–112)
Creatinine, Ser: 0.92 mg/dL (ref 0.40–1.20)
GFR: 57.62 mL/min — ABNORMAL LOW (ref >60.00)
Glucose, Bld: 90 mg/dL (ref 70–99)
Potassium: 3.8 mEq/L (ref 3.5–5.1)
Sodium: 139 mEq/L (ref 135–145)
Total Bilirubin: 0.6 mg/dL (ref 0.2–1.2)
Total Protein: 6.2 g/dL (ref 6.0–8.3)

## 2019-04-28 LAB — MICROALBUMIN / CREATININE URINE RATIO
Creatinine,U: 172 mg/dL
Microalb Creat Ratio: 1 mg/g (ref 0.0–30.0)
Microalb, Ur: 1.6 mg/dL (ref 0.0–1.9)

## 2019-04-28 LAB — VITAMIN D 25 HYDROXY (VIT D DEFICIENCY, FRACTURES): VITD: 47.19 ng/mL (ref 30.00–100.00)

## 2019-04-28 MED ORDER — FUROSEMIDE 20 MG PO TABS: 20.0000 mg | ORAL_TABLET | Freq: Every day | ORAL | 0 refills | Status: DC

## 2019-04-28 NOTE — Progress Notes (Signed)
PCP notes:  Health Maintenance: No gaps noted   Abnormal Screenings: none   Patient concerns: Needs her ears cleaned Complains of swallowing and dry throat issues Complains of fatigue and shortness of breath   Nurse concerns: none   Next PCP appt.: 05/05/2019 @ 3:15 pm

## 2019-04-28 NOTE — Patient Instructions (Signed)
[name] , Thank you for taking time to come for your Medicare Wellness Visit. I appreciate your ongoing commitment to your health goals. Please review the following plan we discussed and let me know if I can assist you in the future.   Screening recommendations/referrals: Colonoscopy: no longer required Mammogram: no longer required Bone Density: Up to date, completed 08/05/2016 Recommended yearly ophthalmology/optometry visit for glaucoma screening and checkup Recommended yearly dental visit for hygiene and checkup  Vaccinations: Influenza vaccine: Up to date, completed 02/16/2019 Pneumococcal vaccine: Completed series Tdap vaccine: decline Shingles vaccine: discussed    Advanced directives: copy in chart  Conditions/risks identified: hypertension  Next appointment: 05/05/2019 @ 3:15 pm    Preventive Care 33 Years and Older, Female Preventive care refers to lifestyle choices and visits with your health care provider that can promote health and wellness. What does preventive care include?  A yearly physical exam. This is also called an annual well check.  Dental exams once or twice a year.  Routine eye exams. Ask your health care provider how often you should have your eyes checked.  Personal lifestyle choices, including:  Daily care of your teeth and gums.  Regular physical activity.  Eating a healthy diet.  Avoiding tobacco and drug use.  Limiting alcohol use.  Practicing safe sex.  Taking low-dose aspirin every day.  Taking vitamin and mineral supplements as recommended by your health care provider. What happens during an annual well check? The services and screenings done by your health care provider during your annual well check will depend on your age, overall health, lifestyle risk factors, and family history of disease. Counseling  Your health care provider may ask you questions about your:  Alcohol use.  Tobacco use.  Drug use.  Emotional  well-being.  Home and relationship well-being.  Sexual activity.  Eating habits.  History of falls.  Memory and ability to understand (cognition).  Work and work Statistician.  Reproductive health. Screening  You may have the following tests or measurements:  Height, weight, and BMI.  Blood pressure.  Lipid and cholesterol levels. These may be checked every 5 years, or more frequently if you are over 30 years old.  Skin check.  Lung cancer screening. You may have this screening every year starting at age 43 if you have a 30-pack-year history of smoking and currently smoke or have quit within the past 15 years.  Fecal occult blood test (FOBT) of the stool. You may have this test every year starting at age 60.  Flexible sigmoidoscopy or colonoscopy. You may have a sigmoidoscopy every 5 years or a colonoscopy every 10 years starting at age 68.  Hepatitis C blood test.  Hepatitis B blood test.  Sexually transmitted disease (STD) testing.  Diabetes screening. This is done by checking your blood sugar (glucose) after you have not eaten for a while (fasting). You may have this done every 1-3 years.  Bone density scan. This is done to screen for osteoporosis. You may have this done starting at age 61.  Mammogram. This may be done every 1-2 years. Talk to your health care provider about how often you should have regular mammograms. Talk with your health care provider about your test results, treatment options, and if necessary, the need for more tests. Vaccines  Your health care provider may recommend certain vaccines, such as:  Influenza vaccine. This is recommended every year.  Tetanus, diphtheria, and acellular pertussis (Tdap, Td) vaccine. You may need a Td booster every  10 years.  Zoster vaccine. You may need this after age 50.  Pneumococcal 13-valent conjugate (PCV13) vaccine. One dose is recommended after age 75.  Pneumococcal polysaccharide (PPSV23) vaccine. One  dose is recommended after age 72. Talk to your health care provider about which screenings and vaccines you need and how often you need them. This information is not intended to replace advice given to you by your health care provider. Make sure you discuss any questions you have with your health care provider. Document Released: 04/21/2015 Document Revised: 12/13/2015 Document Reviewed: 01/24/2015 Elsevier Interactive Patient Education  2017 [place] Prevention in the Home Falls can cause injuries. They can happen to people of all ages. There are many things you can do to make your home safe and to help prevent falls. What can I do on the outside of my home?  Regularly fix the edges of walkways and driveways and fix any cracks.  Remove anything that might make you trip as you walk through a door, such as a raised step or threshold.  Trim any bushes or trees on the path to your home.  Use bright outdoor lighting.  Clear any walking paths of anything that might make someone trip, such as rocks or tools.  Regularly check to see if handrails are loose or broken. Make sure that both sides of any steps have handrails.  Any raised decks and porches should have guardrails on the edges.  Have any leaves, snow, or ice cleared regularly.  Use sand or salt on walking paths during winter.  Clean up any spills in your garage right away. This includes oil or grease spills. What can I do in the bathroom?  Use night lights.  Install grab bars by the toilet and in the tub and shower. Do not use towel bars as grab bars.  Use non-skid mats or decals in the tub or shower.  If you need to sit down in the shower, use a plastic, non-slip stool.  Keep the floor dry. Clean up any water that spills on the floor as soon as it happens.  Remove soap buildup in the tub or shower regularly.  Attach bath mats securely with double-sided non-slip rug tape.  Do not have throw rugs and other  things on the floor that can make you trip. What can I do in the bedroom?  Use night lights.  Make sure that you have a light by your bed that is easy to reach.  Do not use any sheets or blankets that are too big for your bed. They should not hang down onto the floor.  Have a firm chair that has side arms. You can use this for support while you get dressed.  Do not have throw rugs and other things on the floor that can make you trip. What can I do in the kitchen?  Clean up any spills right away.  Avoid walking on wet floors.  Keep items that you use a lot in easy-to-reach places.  If you need to reach something above you, use a strong step stool that has a grab bar.  Keep electrical cords out of the way.  Do not use floor polish or wax that makes floors slippery. If you must use wax, use non-skid floor wax.  Do not have throw rugs and other things on the floor that can make you trip. What can I do with my stairs?  Do not leave any items on the stairs.  Make sure  that there are handrails on both sides of the stairs and use them. Fix handrails that are broken or loose. Make sure that handrails are as long as the stairways.  Check any carpeting to make sure that it is firmly attached to the stairs. Fix any carpet that is loose or worn.  Avoid having throw rugs at the top or bottom of the stairs. If you do have throw rugs, attach them to the floor with carpet tape.  Make sure that you have a light switch at the top of the stairs and the bottom of the stairs. If you do not have them, ask someone to add them for you. What else can I do to help prevent falls?  Wear shoes that:  Do not have high heels.  Have rubber bottoms.  Are comfortable and fit you well.  Are closed at the toe. Do not wear sandals.  If you use a stepladder:  Make sure that it is fully opened. Do not climb a closed stepladder.  Make sure that both sides of the stepladder are locked into place.  Ask  someone to hold it for you, if possible.  Clearly mark and make sure that you can see:  Any grab bars or handrails.  First and last steps.  Where the edge of each step is.  Use tools that help you move around (mobility aids) if they are needed. These include:  Canes.  Walkers.  Scooters.  Crutches.  Turn on the lights when you go into a dark area. Replace any light bulbs as soon as they burn out.  Set up your furniture so you have a clear path. Avoid moving your furniture around.  If any of your floors are uneven, fix them.  If there are any pets around you, be aware of where they are.  Review your medicines with your doctor. Some medicines can make you feel dizzy. This can increase your chance of falling. Ask your doctor what other things that you can do to help prevent falls. This information is not intended to replace advice given to you by your health care provider. Make sure you discuss any questions you have with your health care provider. Document Released: 01/19/2009 Document Revised: 08/31/2015 Document Reviewed: 04/29/2014 Elsevier Interactive Patient Education  2017 [employer].

## 2019-04-28 NOTE — Progress Notes (Signed)
Subjective:   LANANH MILLAR is a 86 y.o. female who presents for Medicare Annual (Subsequent) preventive examination.  Review of Systems: N/A   This visit is being conducted through telemedicine via telephone at the nurse health advisor's home address due to the COVID-19 pandemic. This patient has given me verbal consent via doximity to conduct this visit, patient states they are participating from their home address. Patient and myself are on the telephone call. There is no referral for this visit. Some vital signs may be absent or patient reported.    Patient identification: identified by name, DOB, and current address   Cardiac Risk Factors include: advanced age (>25men, >22 women);hypertension     Objective:     Vitals: There were no vitals taken for this visit.  There is no height or weight on file to calculate BMI.  Advanced Directives 04/28/2019 02/01/2019 07/03/2018 04/22/2018 01/16/2018 12/09/2017 11/05/2016  Does Patient Have a Medical Advance Directive? Yes Yes Yes Yes Yes Yes Yes  Type of Paramedic of [place];Living will Healthcare Power of [place];Living will [facility];Living will [place];Living will Living will  Does patient want to make changes to medical advance directive? - No - Patient declined - - No - Patient declined - No - Patient declined  Copy of [place] in Chart? Yes - validated most recent copy scanned in chart (See row information) No - copy requested - No - copy requested - - -    Tobacco Social History   Tobacco Use  Smoking Status Former Smoker  . Packs/day: 0.25  . Years: 1.00  . Pack years: 0.25  . Quit date: 04/08/1976  . Years since quitting: 43.0  Smokeless Tobacco Never Used  Tobacco Comment   pt smokes occasionally "socially"     Counseling given: Not Answered Comment: pt smokes occasionally "socially"   Clinical  Intake:  Pre-visit preparation completed: Yes  Pain : 0-10 Pain Score: 5  Pain Type: Acute pain Pain Location: (toe) Pain Descriptors / Indicators: Aching Pain Onset: More than a month ago Pain Frequency: Intermittent     Nutritional Risks: None Diabetes: No  How often do you need to have someone help you when you read instructions, pamphlets, or other written materials from your doctor or pharmacy?: 1 - Never What is the last grade level you completed in school?: 12th  Interpreter Needed?: No  Information entered by :: CJohnson, LPN  Past Medical History:  Diagnosis Date  . Abdominal aortic atherosclerosis ([place]) 09/2015   by xray  . Anxiety   . BCC (basal cell carcinoma of skin) 05/2011   on nose  . Breast lesion    a. diagnosed with complex sclerosing lesion with calcifications of the right breast in 08/2016  . Chest pain    a. nuclear stress test 03/2012: normal; b. 09/2018 MV: EF >65%, no isch/scar.   . DDD (degenerative disc disease), lumbosacral 12/06/1992  . Gallstones 09/2015   incidentally by xray  . GERD (gastroesophageal reflux disease) 05/1999  . HERPES ZOSTER 04/13/2007   Qualifier: Diagnosis of  By: [name], [name]   . Hiatal hernia   . History of shingles    ophthalmic, takes acyclovir daily  . Hyperlipidemia 12/2003  . Hypertension 1990  . Mitral regurgitation    a. echo 03/2012: EF 60-65%, no RWMA, mild to mod AI/MR, mod TR, PASP 37 mmHg  . Osteoarthritis 08/21/1989  .  Osteoporosis with fracture 11/1997   with compression fracture T12  . Persistent atrial fibrillation ([place]) 03/16/2012   a. CHADS2VASc => 5 (HTN, age x 2, vascular disease, sex category)-->Eliquis 5 BID;  b. Recurrent AF in 06/2016;  c. 01/2017 s/p DCCV;  d. 02/2017 recurrent AFib->Amio added->DCCV; e. 03/2017 recurrent AFib, s/p reload of amio and DCCV 04/02/2017  . Rheumatoid arthritis ([place])   . RLS (restless legs syndrome)    Past Surgical History:  Procedure Laterality  Date  . ABDOMINAL HYSTERECTOMY  Age 43 - 7   S/P TAH  . abdominal ultrasound  04/23/2004   Gallstones  . BREAST LUMPECTOMY WITH RADIOACTIVE SEED LOCALIZATION Right 10/2016   breast lumpectomy with radioactive seed localization and margin assessment for complex sclerosing lesion [facility])  . BREAST LUMPECTOMY WITH RADIOACTIVE SEED LOCALIZATION Right 11/05/2016   Procedure: RIGHT BREAST LUMPECTOMY WITH RADIOACTIVE SEED LOCALIZATION;  Surgeon: Fanny Skates, MD;  Location: [facility];  Service: General;  Laterality: Right;  . CARDIOVERSION N/A 01/31/2017   Procedure: CARDIOVERSION;  Surgeon: Wellington Hampshire, MD;  Location: [facility];  Service: Cardiovascular;  Laterality: N/A;  . CARDIOVERSION N/A 03/03/2017   Procedure: CARDIOVERSION;  Surgeon: Wellington Hampshire, MD;  Location: [facility];  Service: Cardiovascular;  Laterality: N/A;  . CARDIOVERSION N/A 04/02/2017   Procedure: CARDIOVERSION;  Surgeon: Wellington Hampshire, MD;  Location: [facility];  Service: Cardiovascular;  Laterality: N/A;  . CARDIOVERSION N/A 01/16/2018   Procedure: CARDIOVERSION;  Surgeon: Wellington Hampshire, MD;  Location: [facility];  Service: Cardiovascular;  Laterality: N/A;  . CATARACT EXTRACTION  05/2010   left eye  . CERVICAL DISCECTOMY  1992   Fusion (Dr. Joya Salm)  . ESI Bilateral 01/2016   S1 transforaminal ESI x3  . ESOPHAGOGASTRODUODENOSCOPY  01/01/2002   with ulcer, bx. neg; + stricture gastric ulcer with hemorrhage  . ESOPHAGOGASTRODUODENOSCOPY  04/23/2004   Esophageal stricture -- dilated  . LOWER EXTREMITY ANGIOGRAPHY Left 02/01/2019   LOWER EXTREMITY ANGIOGRAPHY;  Surgeon: Algernon Huxley, MD  . LOWER EXTREMITY ANGIOGRAPHY Right 03/25/2019   Procedure: LOWER EXTREMITY ANGIOGRAPHY;  Surgeon: Algernon Huxley, MD;  Location: [facility];  Service: Cardiovascular;  Laterality: Right;  . nuclear stress test  03/2012   no ischemia  . PERIPHERAL VASCULAR BALLOON ANGIOPLASTY Left 01/2019    Percutaneous transluminal angioplasty of left anterior and posterior tibial artery with 2.5 mm diameter by 22 cm length angioplasty balloon ([place])  . SKIN CANCER EXCISION  05/20/2011   BCC from nose  . US ECHOCARDIOGRAPHY  03/2012   in fl[place], EF 60%, mild-mod aortic, mitral, tricuspid regurg, mildly dilated LA   Family History  Problem Relation Age of Onset  . Emphysema Mother   . Heart disease Father        MI  . Stroke Father        first at age 30.  . Stroke Sister   . Diabetes Sister   . Hypertension Brother   . Heart disease Brother        Heart Valve  . Stroke Brother   . Diabetes Brother   . Cancer Brother        esophageal  . Diabetes Brother   . Diabetes Brother   . Colon cancer Neg Hx    Social History   Socioeconomic History  . Marital status: Widowed    Spouse name: Not on file  . Number of children: 5  . Years of education: Not on file  .  Highest education level: Not on file  Occupational History  . Occupation: Retired - Occupational psychologist: RETIRED  Tobacco Use  . Smoking status: Former Smoker    Packs/day: 0.25    Years: 1.00    Pack years: 0.25    Quit date: 04/08/1976    Years since quitting: 43.0  . Smokeless tobacco: Never Used  . Tobacco comment: pt smokes occasionally "socially"  Substance and Sexual Activity  . Alcohol use: No    Alcohol/week: 1.0 standard drinks    Types: 1 Glasses of wine per week    Comment: occassional  . Drug use: No  . Sexual activity: Never  Other Topics Concern  . Not on file  Social History Narrative   Lives with grandson who smokes, dog   From [place].  Widowed 01/1999; 1st marriage 25 years / 2nd marriage 13 years.   One son deceased 01/31/23.   Activity: active with dog   Diet: good water, fruits/vegetables daily   Social Determinants of Health   Financial Resource Strain: Low Risk   . Difficulty of Paying Living Expenses: Not hard at all  Food Insecurity: No Food Insecurity  . Worried About Ship broker in the Last Year: Never true  . Ran Out of Food in the Last Year: Never true  Transportation Needs: No Transportation Needs  . Lack of Transportation (Medical): No  . Lack of Transportation (Non-Medical): No  Physical Activity: Inactive  . Days of Exercise per Week: 0 days  . Minutes of Exercise per Session: 0 min  Stress: No Stress Concern Present  . Feeling of Stress : Not at all  Social Connections:   . Frequency of Communication with Friends and Family: Not on file  . Frequency of Social Gatherings with Friends and Family: Not on file  . Attends Religious Services: Not on file  . Active Member of Clubs or Organizations: Not on file  . Attends Archivist Meetings: Not on file  . Marital Status: Not on file    Outpatient Encounter Medications as of 04/28/2019  Medication Sig  . acetaminophen (TYLENOL) 500 MG tablet Take 1,000 mg every 8 (eight) hours as needed by mouth for moderate pain.   Marland Kitchen acyclovir (ZOVIRAX) 400 MG tablet Take 400 mg 2 (two) times daily by mouth.   Marland Kitchen amiodarone (PACERONE) 200 MG tablet TAKE 1 TABLET BY MOUTH DAILY  . aspirin EC 81 MG tablet Take 1 tablet (81 mg total) by mouth daily.  Marland Kitchen atorvastatin (LIPITOR) 20 MG tablet TAKE ONE TABLET BY MOUTH EVERY DAY  . DULoxetine (CYMBALTA) 60 MG capsule TAKE 1 CAPSULE BY MOUTH EVERY DAY  . ELIQUIS 2.5 MG TABS tablet TAKE ONE TABLET BY MOUTH TWICE DAILY  . ergocalciferol (VITAMIN D2) 1.25 MG (50000 UT) capsule Take 50,000 Units by mouth once a week.  . furosemide (LASIX) 20 MG tablet TAKE 1 TABLET BY MOUTH DAILY  . gabapentin (NEURONTIN) 300 MG capsule TAKE 1 CAPSULE BY MOUTH THREE TIMES DAILY  . metoprolol tartrate (LOPRESSOR) 25 MG tablet Take 0.5 tablets (12.5 mg total) by mouth 2 (two) times daily.  . pantoprazole (PROTONIX) 40 MG tablet TAKE ONE TABLET BY MOUTH EVERY DAY  . potassium chloride SA (KLOR-CON) 20 MEQ tablet   . prednisoLONE acetate (PRED FORTE) 1 % ophthalmic suspension Place 1 drop  into the left eye daily.   Marland Kitchen rOPINIRole (REQUIP) 1 MG tablet TAKE TWO TABLETS BY MOUTH AT BEDTIME  . tiZANidine (ZANAFLEX)  2 MG tablet Take 2 mg by mouth every evening.   . traMADol (ULTRAM) 50 MG tablet Take 1 tablet (50 mg total) by mouth 2 (two) times daily.  . potassium chloride (KLOR-CON) 10 MEQ tablet Take 2 tablets (20 mEq total) by mouth daily.   No facility-administered encounter medications on file as of 04/28/2019.    Activities of Daily Living In your present state of health, do you have any difficulty performing the following activities: 04/28/2019 03/25/2019  Hearing? Y N  Comment very hard of hearing -  Vision? N N  Difficulty concentrating or making decisions? N N  Walking or climbing stairs? Y Y  Comment has balance issues -  Dressing or bathing? Y N  Comment needs assistance -  Doing errands, shopping? N -  Preparing Food and eating ? Y -  Comment gets assistance -  Using the Toilet? N -  In the past six months, have you accidently leaked urine? Y -  Comment wears pads -  Do you have problems with loss of bowel control? N -  Managing your Medications? N -  Managing your Finances? N -  Housekeeping or managing your Housekeeping? Y -  Comment needs assistance -  Some recent data might be hidden    Patient Care Team: Ria Bush, MD as PCP - General (Family Medicine) Wellington Hampshire, MD as PCP - Cardiology (Cardiology) Wellington Hampshire, MD as Consulting Physician (Cardiology) Leandrew Koyanagi, MD as Referring Physician (Ophthalmology) Sharlet Salina, MD as Referring Physician (Physical Medicine and Rehabilitation)    Assessment:   This is a routine wellness examination for Kenise.  Exercise Activities and Dietary recommendations Current Exercise Habits: The patient does not participate in regular exercise at present, Exercise limited by: None identified  Goals    . Increase physical activity     Starting 04/22/2018, I will continue to walk at  least 10 min daily.     . Patient Stated     04/28/2019, I will maintain and continue medications as prescribed.       Fall Risk Fall Risk  04/28/2019 04/22/2018 11/11/2016 05/28/2016 12/08/2014  Falls in the past year? 1 1 No Yes Yes  Comment Balance issues - - pt reports multiple falls while gardening or doing other yard work -  Number falls in past yr: 1 1 - 2 or more 1  Injury with Fall? 0 1 - No Yes  Risk for fall due to : Medication side effect;History of fall(s);Impaired balance/gait Impaired balance/gait;Impaired mobility;History of fall(s) - - -  Follow up Falls evaluation completed;Falls prevention discussed - - - -   Is the patient's home free of loose throw rugs in walkways, pet beds, electrical cords, etc?   yes      Grab bars in the bathroom? yes      Handrails on the stairs?   yes      Adequate lighting?   yes  Timed Get Up and Go performed: N/A  Depression Screen PHQ 2/9 Scores 04/28/2019 04/22/2018 05/28/2016 12/08/2014  PHQ - 2 Score 0 0 0 0  PHQ- 9 Score 0 0 - -     Cognitive Function MMSE - Mini Mental State Exam 04/28/2019 04/22/2018 05/28/2016  Orientation to time 5 5 5   Orientation to Place 5 5 5   Registration 3 3 3   Attention/ Calculation 5 0 0  Recall 3 3 3   Language- name 2 objects - 0 0  Language- repeat 0 1 1  Language- follow  3 step command - 3 3  Language- read & follow direction - 0 0  Write a sentence - 0 0  Copy design - 0 0  Total score - 20 20  Mini Cog  Mini-Cog screen was completed. Maximum score is 22. A value of 0 denotes this part of the MMSE was not completed or the patient failed this part of the Mini-Cog screening.       Immunization History  Administered Date(s) Administered  . Influenza Split 03/20/2011  . Influenza, High Dose Seasonal PF 02/16/2019  . Influenza,inj,Quad PF,6+ Mos 12/08/2014, 12/04/2015, 01/07/2017, 01/05/2018  . Influenza-Unspecified 03/03/2013  . Pneumococcal Conjugate-13 01/18/2015  . Pneumococcal  Polysaccharide-23 12/08/2003  . Td 12/08/2003    Qualifies for Shingles Vaccine?Yes  Screening Tests Health Maintenance  Topic Date Due  . DTAP VACCINES (1) 08[birth year]/1931  . DTaP/Tdap/Td (2 - Tdap) 12/07/2023 (Originally 12/07/2013)  . TETANUS/TDAP  12/07/2023 (Originally 12/07/2013)  . INFLUENZA VACCINE  Completed  . DEXA SCAN  Completed  . PNA vac Low Risk Adult  Completed    Cancer Screenings: Lung: Low Dose CT Chest recommended if Age 48-80 years, 30 pack-year currently smoking OR have quit w/in 15 years. Patient does not qualify. Breast:  Up to date on Mammogram? Yes, no longer required   Up to date of Bone Density/Dexa? Yes, completed 08/05/2016 Colorectal: no longer required  Additional Screenings:  Hepatitis C Screening: N/A     Plan:    Patient will maintain and continue medications as prescribed.    I have personally reviewed and noted the following in the patient's chart:   . Medical and social history . Use of alcohol, tobacco or illicit drugs  . Current medications and supplements . Functional ability and status . Nutritional status . Physical activity . Advanced directives . List of other physicians . Hospitalizations, surgeries, and ER visits in previous 12 months . Vitals . Screenings to include cognitive, depression, and falls . Referrals and appointments  In addition, I have reviewed and discussed with patient certain preventive protocols, quality metrics, and best practice recommendations. A written personalized care plan for preventive services as well as general preventive health recommendations were provided to patient.     Andrez Grime, LPN  [id number]

## 2019-04-28 NOTE — Addendum Note (Signed)
Addended by: Cloyd Stagers on: 04/28/2019 01:42 PM   Modules accepted: Orders

## 2019-04-30 ENCOUNTER — Telehealth: Payer: Self-pay | Admitting: Family Medicine

## 2019-04-30 ENCOUNTER — Other Ambulatory Visit: Payer: Self-pay | Admitting: Cardiovascular Disease

## 2019-04-30 MED ORDER — PANTOPRAZOLE SODIUM 40 MG PO TBEC: 40.0000 mg | DELAYED_RELEASE_TABLET | Freq: Every day | ORAL | 0 refills | Status: DC

## 2019-04-30 NOTE — Telephone Encounter (Signed)
Patient's daughter is requesting a refill She stated the pharmacy has been trying to reach [place] But I did not see refill request    [place]

## 2019-04-30 NOTE — Telephone Encounter (Signed)
Patient daughter calling to state all future medication needs to be changed to [facility]

## 2019-04-30 NOTE — Telephone Encounter (Signed)
E-scribed refill 

## 2019-04-30 NOTE — Telephone Encounter (Signed)
Documented in chart.

## 2019-05-05 ENCOUNTER — Ambulatory Visit (INDEPENDENT_AMBULATORY_CARE_PROVIDER_SITE_OTHER): Payer: Medicare Other | Admitting: Family Medicine

## 2019-05-05 ENCOUNTER — Encounter: Payer: Self-pay | Admitting: Family Medicine

## 2019-05-05 VITALS — BP 152/70 | HR 64 | Temp 97.6°F | Ht 61.25 in | Wt 139.0 lb

## 2019-05-05 DIAGNOSIS — I739 Peripheral vascular disease, unspecified: Secondary | ICD-10-CM

## 2019-05-05 DIAGNOSIS — M810 Age-related osteoporosis without current pathological fracture: Secondary | ICD-10-CM

## 2019-05-05 DIAGNOSIS — I700 Atherosclerosis of aorta: Secondary | ICD-10-CM

## 2019-05-05 DIAGNOSIS — G8929 Other chronic pain: Secondary | ICD-10-CM

## 2019-05-05 DIAGNOSIS — M79605 Pain in left leg: Secondary | ICD-10-CM

## 2019-05-05 DIAGNOSIS — I4819 Other persistent atrial fibrillation: Secondary | ICD-10-CM

## 2019-05-05 DIAGNOSIS — E782 Mixed hyperlipidemia: Secondary | ICD-10-CM

## 2019-05-05 DIAGNOSIS — R2681 Unsteadiness on feet: Secondary | ICD-10-CM

## 2019-05-05 DIAGNOSIS — N3941 Urge incontinence: Secondary | ICD-10-CM

## 2019-05-05 DIAGNOSIS — E538 Deficiency of other specified B group vitamins: Secondary | ICD-10-CM | POA: Diagnosis not present

## 2019-05-05 DIAGNOSIS — R0789 Other chest pain: Secondary | ICD-10-CM | POA: Diagnosis not present

## 2019-05-05 DIAGNOSIS — Z1231 Encounter for screening mammogram for malignant neoplasm of breast: Secondary | ICD-10-CM | POA: Diagnosis not present

## 2019-05-05 DIAGNOSIS — Z0000 Encounter for general adult medical examination without abnormal findings: Secondary | ICD-10-CM

## 2019-05-05 DIAGNOSIS — K5900 Constipation, unspecified: Secondary | ICD-10-CM

## 2019-05-05 DIAGNOSIS — G6289 Other specified polyneuropathies: Secondary | ICD-10-CM

## 2019-05-05 DIAGNOSIS — L989 Disorder of the skin and subcutaneous tissue, unspecified: Secondary | ICD-10-CM | POA: Diagnosis not present

## 2019-05-05 DIAGNOSIS — E559 Vitamin D deficiency, unspecified: Secondary | ICD-10-CM

## 2019-05-05 DIAGNOSIS — M5136 Other intervertebral disc degeneration, lumbar region: Secondary | ICD-10-CM

## 2019-05-05 DIAGNOSIS — I10 Essential (primary) hypertension: Secondary | ICD-10-CM

## 2019-05-05 DIAGNOSIS — F331 Major depressive disorder, recurrent, moderate: Secondary | ICD-10-CM

## 2019-05-05 DIAGNOSIS — K219 Gastro-esophageal reflux disease without esophagitis: Secondary | ICD-10-CM

## 2019-05-05 DIAGNOSIS — M51369 Other intervertebral disc degeneration, lumbar region without mention of lumbar back pain or lower extremity pain: Secondary | ICD-10-CM

## 2019-05-05 DIAGNOSIS — M79604 Pain in right leg: Secondary | ICD-10-CM

## 2019-05-05 LAB — POC URINALSYSI DIPSTICK (AUTOMATED)
Bilirubin, UA: NEGATIVE
Blood, UA: NEGATIVE
Glucose, UA: NEGATIVE
Ketones, UA: NEGATIVE
Leukocytes, UA: NEGATIVE
Nitrite, UA: NEGATIVE
Protein, UA: NEGATIVE
Spec Grav, UA: 1.025 (ref 1.010–1.025)
Urobilinogen, UA: 0.2 E.U./dL
pH, UA: 5.5 (ref 5.0–8.0)

## 2019-05-05 MED ORDER — CYANOCOBALAMIN 1000 MCG/ML IJ SOLN
1000.0000 ug | Freq: Once | INTRAMUSCULAR | Status: AC
  Administered 2019-05-05: 17:00:00 1000 ug via INTRAMUSCULAR

## 2019-05-05 MED ORDER — LISINOPRIL 2.5 MG PO TABS: 2.5000 mg | ORAL_TABLET | Freq: Every day | ORAL | 3 refills | Status: DC

## 2019-05-05 NOTE — Progress Notes (Signed)
This visit was conducted in person.  BP (!) 152/70 (BP Location: Left Arm, Patient Position: Sitting, Cuff Size: Normal)   Pulse 64   Temp 97.6 F (36.4 C) (Temporal)   Ht 5' 1.25" (1.556 m)   Wt 139 lb (63 kg)   SpO2 94%   BMI 26.05 kg/m    BP Readings from Last 3 Encounters:  05/05/19 (!) 152/70  04/23/19 (!) 178/77  03/25/19 (!) 155/70   CC: CPE Subjective:    Patient ID: [name], female    DOB: 01/07/1930, 86 y.o.   MRN: [id number]  HPI: [name] is a 86 y.o. female presenting on 05/05/2019 for Annual Exam (Prt 2.  Pt accompanied by daughter, [name]- temp 97.8.)   Saw health advisor last week for medicare wellness visit. Note reviewed.   No exam data present    Clinical Support from 04/28/2019 in [place] at [facility] Total Score  0      Fall Risk  04/28/2019 04/22/2018 11/11/2016 05/28/2016 12/08/2014  Falls in the past year? 1 1 No Yes Yes  Comment Balance issues - - pt reports multiple falls while gardening or doing other yard work -  Number falls in past yr: 1 1 - 2 or more 1  Injury with Fall? 0 1 - No Yes  Risk for fall due to : Medication side effect;History of fall(s);Impaired balance/gait Impaired balance/gait;Impaired mobility;History of fall(s) - - -  Follow up Falls evaluation completed;Falls prevention discussed - - - -    Saw [facility] for bilateral leg/foot pain s/p angiogram with intervention (ballon angioplasty) - with improvement in LE symptoms. Walking without pain.   NCS - Abnormal study. There is electrodiagnostic evidence of a chronic, severe, mixed polyneuropathy in the lower extremities.  Painful R callus R lateral foot - to see podiatry for this.   Preventative: Colon cancer screening - age out. Normal iFOBs last 05/2012 Well woman - she has had a hysterectomy. We have stopped Pap smears.  Mammogram abnormal 2018 s/p R breast lumpectomy of benign complex sclerosing lesion (Dr Renelda Loma) - has not had f/u, requests at  [facility] - ordered. DEXA T -2.7 spine, -2.6 hip (07/2016) - known osteoporosis with compression fractures, latest after fall 02/2017 now on prolia (started 08/2017) - will recheck  Flu - yearly Td - 2005 Pneumovax 2005, prevnar10/2016 Shingles shot- not interested in zostavax - h/o ocular shingles. Covid vaccine - will consider  Advanced planning - Living will scanned 04/2017. No HCPOA form. Asked to bring me copy - son [name] is HCPOA.Does not want extended life support. Seat belt use discussed Sunscreen use discussed. Notes scaly skin lesions on both dorsal hands and on chest.  Ex smoker remotely quit Alcohol - seldom Dentist - doesn't see - has dentures Eye exam yearly Bowel - constipation managed with miralax  Bladder - urge incontinence with mild stress symptoms   Lives with grandson who smokes, dog From [place]. Widowed 01/1999; 1st marriage 25 years / 2nd marriage 13 years. One son deceased January 28, 2023. Activity: active with dog Diet: good water, fruits/vegetables daily     Relevant past medical, surgical, family and social history reviewed and updated as indicated. Interim medical history since our last visit reviewed. Allergies and medications reviewed and updated. Outpatient Medications Prior to Visit  Medication Sig Dispense Refill  . acetaminophen (TYLENOL) 500 MG tablet Take 1,000 mg every 8 (eight) hours as needed by mouth for moderate pain.     Marland Kitchen  acyclovir (ZOVIRAX) 400 MG tablet Take 400 mg 2 (two) times daily by mouth.     Marland Kitchen amiodarone (PACERONE) 200 MG tablet TAKE 1 TABLET BY MOUTH DAILY 90 tablet 3  . aspirin EC 81 MG tablet Take 1 tablet (81 mg total) by mouth daily. 150 tablet 2  . atorvastatin (LIPITOR) 20 MG tablet TAKE ONE TABLET BY MOUTH EVERY DAY 90 tablet 2  . DULoxetine (CYMBALTA) 60 MG capsule TAKE 1 CAPSULE BY MOUTH EVERY DAY 90 capsule 1  . ELIQUIS 2.5 MG TABS tablet TAKE ONE TABLET BY MOUTH TWICE DAILY 180 tablet 1  . ergocalciferol (VITAMIN D2) 1.25 MG  (50000 UT) capsule Take 50,000 Units by mouth once a week.    . furosemide (LASIX) 20 MG tablet Take 1 tablet (20 mg total) by mouth daily. Take 1 tablet daily and may take 1 extra tablet as needed for swelling up to 3x per week 126 tablet 0  . gabapentin (NEURONTIN) 300 MG capsule Take 1 capsule (300 mg total) by mouth at bedtime.    . metoprolol tartrate (LOPRESSOR) 25 MG tablet Take 0.5 tablets (12.5 mg total) by mouth 2 (two) times daily. 90 tablet 2  . pantoprazole (PROTONIX) 40 MG tablet Take 1 tablet (40 mg total) by mouth daily. 90 tablet 0  . potassium chloride (KLOR-CON) 10 MEQ tablet Take 2 tablets (20 mEq total) by mouth daily. 60 tablet 5  . prednisoLONE acetate (PRED FORTE) 1 % ophthalmic suspension Place 1 drop into the left eye daily.     Marland Kitchen rOPINIRole (REQUIP) 1 MG tablet TAKE TWO TABLETS BY MOUTH AT BEDTIME 180 tablet 3  . tiZANidine (ZANAFLEX) 2 MG tablet Take 2 mg by mouth every evening. Takes 1 tablet daily    . traMADol (ULTRAM) 50 MG tablet Take 1 tablet (50 mg total) by mouth 2 (two) times daily.  0  . gabapentin (NEURONTIN) 300 MG capsule TAKE 1 CAPSULE BY MOUTH THREE TIMES DAILY (Patient taking differently: Takes 1 capsule daily) 270 capsule 1  . potassium chloride SA (KLOR-CON) 20 MEQ tablet      No facility-administered medications prior to visit.     Per HPI unless specifically indicated in ROS section below Review of Systems  Constitutional: Negative for activity change, appetite change, chills, fatigue, fever and unexpected weight change.  HENT: Negative for hearing loss.   Eyes: Negative for visual disturbance.  Respiratory: Positive for chest tightness (worse with exertion - worse with recent family stress) and shortness of breath. Negative for cough and wheezing.   Cardiovascular: Negative for chest pain, palpitations and leg swelling.  Gastrointestinal: Negative for abdominal distention, abdominal pain, blood in stool, constipation, diarrhea, nausea and  vomiting.  Genitourinary: Negative for difficulty urinating and hematuria.  Musculoskeletal: Negative for arthralgias, myalgias and neck pain.  Skin: Negative for rash.  Neurological: Positive for dizziness (fall last week due to imbalance). Negative for seizures, syncope and headaches.  Hematological: Negative for adenopathy. Bruises/bleeds easily.  Psychiatric/Behavioral: Positive for dysphoric mood (family stressors, friend illnesses). The patient is not nervous/anxious.    Objective:    BP (!) 152/70 (BP Location: Left Arm, Patient Position: Sitting, Cuff Size: Normal)   Pulse 64   Temp 97.6 F (36.4 C) (Temporal)   Ht 5' 1.25" (1.556 m)   Wt 139 lb (63 kg)   SpO2 94%   BMI 26.05 kg/m   Wt Readings from Last 3 Encounters:  05/05/19 139 lb (63 kg)  04/23/19 137 lb (62.1 kg)  03/25/19 140 lb (63.5 kg)    Physical Exam Vitals and nursing note reviewed.  Constitutional:      General: She is not in acute distress.    Appearance: Normal appearance. She is well-developed. She is not ill-appearing.  HENT:     Head: Normocephalic and atraumatic.     Right Ear: Hearing, tympanic membrane, ear canal and external ear normal.     Left Ear: Hearing, tympanic membrane, ear canal and external ear normal.     Mouth/Throat:     Pharynx: Uvula midline.  Eyes:     General: No scleral icterus.    Extraocular Movements: Extraocular movements intact.     Conjunctiva/sclera: Conjunctivae normal.     Pupils: Pupils are equal, round, and reactive to light.  Neck:     Vascular: No carotid bruit.  Cardiovascular:     Rate and Rhythm: Normal rate and regular rhythm.     Pulses: Normal pulses.          Radial pulses are 2+ on the right side and 2+ on the left side.     Heart sounds: Normal heart sounds. No murmur.  Pulmonary:     Effort: Pulmonary effort is normal. No respiratory distress.     Breath sounds: Normal breath sounds. No wheezing, rhonchi or rales.  Abdominal:     General:  Abdomen is flat. Bowel sounds are normal. There is no distension.     Palpations: Abdomen is soft. There is no mass.     Tenderness: There is no abdominal tenderness. There is no guarding or rebound.     Hernia: No hernia is present.  Musculoskeletal:        General: Normal range of motion.     Cervical back: Normal range of motion and neck supple.  Lymphadenopathy:     Cervical: No cervical adenopathy.  Skin:    General: Skin is warm and dry.     Findings: Lesion present. No rash.     Comments: Scaly nodules on dorsal hands bilaterally  Neurological:     General: No focal deficit present.     Mental Status: She is alert and oriented to person, place, and time.     Comments: CN grossly intact, station and gait intact  Psychiatric:        Mood and Affect: Mood normal.        Behavior: Behavior normal.        Thought Content: Thought content normal.        Judgment: Judgment normal.       Results for orders placed or performed in visit on 05/05/19  POCT Urinalysis Dipstick (Automated)  Result Value Ref Range   Color, UA yellow    Clarity, UA clear    Glucose, UA Negative Negative   Bilirubin, UA negative    Ketones, UA negative    Spec Grav, UA 1.025 1.010 - 1.025   Blood, UA negative    pH, UA 5.5 5.0 - 8.0   Protein, UA Negative Negative   Urobilinogen, UA 0.2 0.2 or 1.0 E.U./dL   Nitrite, UA negative    Leukocytes, UA Negative Negative   EKG - sinus bradycardia 50s, normal axis, intervals, isolated q lead III, nonspecific ST changes unchanged from prior.  Assessment & Plan:  This visit occurred during the SARS-CoV-2 public health emergency.  Safety protocols were in place, including screening questions prior to the visit, additional usage of staff PPE, and extensive cleaning of exam room while observing  appropriate contact time as indicated for disinfecting solutions.   Problem List Items Addressed This Visit    Vitamin D deficiency    Continue replacement.        Vitamin B12 deficiency    Markedly low - b12 shot today, I recommended continue monthly B12 shots.      Urge incontinence    Stable period. Check UA r/o infection as cause. Declines medication.       Relevant Orders   POCT Urinalysis Dipstick (Automated) (Completed)   Skin lesion on examination    Possible squamous cell cancer - will refer to derm.       Relevant Orders   Ambulatory referral to Dermatology   Persistent atrial fibrillation ([place])    Continue rate control on beta blocker and amiodarone, and continue eliquis.       Relevant Medications   lisinopril (ZESTRIL) 2.5 MG tablet   Peripheral neuropathy    Severe, noted on NCS. On low dose gabapentin.       Relevant Medications   gabapentin (NEURONTIN) 300 MG capsule   PAD (peripheral artery disease) (HCC)    Continue aspirin, eliquis, statin      Relevant Medications   lisinopril (ZESTRIL) 2.5 MG tablet   Osteoporosis    H/o pathologic fractures. prolia started 08/2017. Update DEXA.       Relevant Orders   DG Bone Density   MDD (major depressive disorder), recurrent episode ([place])    Stable period - continue cymbalta.       HLD (hyperlipidemia)    Chronic, stable on atorvastatin - continue The ASCVD Risk score Mikey Bussing DC Jr., et al., 2013) failed to calculate for the following reasons:   The 2013 ASCVD risk score is only valid for ages 62 to 17       Relevant Medications   lisinopril (ZESTRIL) 2.5 MG tablet   Health maintenance examination - Primary    Preventative protocols reviewed and updated unless pt declined. Discussed healthy diet and lifestyle.       GERD    Chronic, stable on daily protonix. H/o gastric ulcer and current eliquis and aspiring use.       General unsteadiness    Acute worsening despite completing HH therapy.  Tramadol and gabapentin could contribute - recommend decrease tramadol to BID PRN      Essential hypertension    Chronic, mildly elevated - will start lisinopril 2.5mg   daily.       Relevant Medications   lisinopril (ZESTRIL) 2.5 MG tablet   Encounter for chronic pain management    [place] CSRS reviewed.  See below for tramadol recommendations.       DDD (degenerative disc disease), lumbar    Followed by PMR on BID tramadol - see below.       Constipation    rec schedule miralax for improved constipation.       Chronic leg pain    Mutlifactorial, some improved after vascular intervention - anticipate persistent pain due to neuropathy noted on recent NCS contribution,       Relevant Medications   gabapentin (NEURONTIN) 300 MG capsule   Chest tightness    Describes exertional chest tightness discomfort, notices under stress and with exertion.  Update EKG, will see if we can expedite her cardiology evaluation for worsening chest discomfort and dyspnea. Continue statin, aspirin and eliquis.       Relevant Orders   EKG 12-Lead (Completed)   Ambulatory referral to Cardiology   Abdominal aortic atherosclerosis (  [place])    Continue statin.       Relevant Medications   lisinopril (ZESTRIL) 2.5 MG tablet    Other Visit Diagnoses    Encounter for screening mammogram for malignant neoplasm of breast       Relevant Orders   MM 3D SCREEN BREAST BILATERAL       Meds ordered this encounter  Medications  . lisinopril (ZESTRIL) 2.5 MG tablet    Sig: Take 1 tablet (2.5 mg total) by mouth daily.    Dispense:  30 tablet    Refill:  3  . cyanocobalamin ((VITAMIN B-12)) injection 1,000 mcg   Orders Placed This Encounter  Procedures  . DG Bone Density    Standing Status:   Future    Standing Expiration Date:   07/02/2020    Order Specific Question:   Reason for Exam (SYMPTOM  OR DIAGNOSIS REQUIRED)    Answer:   osteoporosis f/u    Order Specific Question:   Preferred imaging location?    Answer:   [facility]  . MM 3D SCREEN BREAST BILATERAL    Standing Status:   Future    Standing Expiration Date:   07/04/2020    Order Specific Question:    Reason for Exam (SYMPTOM  OR DIAGNOSIS REQUIRED)    Answer:   breast cancer screening    Order Specific Question:   Preferred imaging location?    Answer:   [facility]  . Ambulatory referral to Dermatology    Referral Priority:   Routine    Referral Type:   Consultation    Referral Reason:   Specialty Services Required    Requested Specialty:   Dermatology    Number of Visits Requested:   1  . Ambulatory referral to Cardiology    Referral Priority:   Routine    Referral Type:   Consultation    Referral Reason:   Specialty Services Required    Requested Specialty:   Cardiology    Number of Visits Requested:   1  . POCT Urinalysis Dipstick (Automated)  . EKG 12-Lead    Patient instructions: b12 shot today Urinalysis today.  We will try to get you in sooner with the heart doctor - EKG today. Start lisinopril 2.5mg  daily.  We will order mammogram.  We will refer you to dermatologist.  Call to schedule eye exam.  Try 1/2 capful of miralax daily for constipation, hold for diarrhea.  Continue good water intake, good fiber in the diet.  Try to back off tramadol.  Return in 3 months for follow up visit.   Follow up plan: Return in about 3 months (around 08/03/2019) for follow up visit.  Ria Bush, MD

## 2019-05-05 NOTE — Patient Instructions (Addendum)
b12 shot today Urinalysis today.  We will try to get you in sooner with the heart doctor - EKG today. Start lisinopril 2.5mg  daily.  We will order mammogram.  We will refer you to dermatologist.  Call to schedule eye exam.  Try 1/2 capful of miralax daily for constipation, hold for diarrhea.  Continue good water intake, good fiber in the diet.  Try to back off tramadol.  Return in 3 months for follow up visit.   Health Maintenance After Age 84 After age 71, you are at a higher risk for certain long-term diseases and infections as well as injuries from falls. Falls are a major cause of broken bones and head injuries in people who are older than age 61. Getting regular preventive care can help to keep you healthy and well. Preventive care includes getting regular testing and making lifestyle changes as recommended by your health care provider. Talk with your health care provider about:  Which screenings and tests you should have. A screening is a test that checks for a disease when you have no symptoms.  A diet and exercise plan that is right for you. What should I know about screenings and tests to prevent falls? Screening and testing are the best ways to find a health problem early. Early diagnosis and treatment give you the best chance of managing medical conditions that are common after age 70. Certain conditions and lifestyle choices may make you more likely to have a fall. Your health care provider may recommend:  Regular vision checks. Poor vision and conditions such as cataracts can make you more likely to have a fall. If you wear glasses, make sure to get your prescription updated if your vision changes.  Medicine review. Work with your health care provider to regularly review all of the medicines you are taking, including over-the-counter medicines. Ask your health care provider about any side effects that may make you more likely to have a fall. Tell your health care provider if any  medicines that you take make you feel dizzy or sleepy.  Osteoporosis screening. Osteoporosis is a condition that causes the bones to get weaker. This can make the bones weak and cause them to break more easily.  Blood pressure screening. Blood pressure changes and medicines to control blood pressure can make you feel dizzy.  Strength and balance checks. Your health care provider may recommend certain tests to check your strength and balance while standing, walking, or changing positions.  Foot health exam. Foot pain and numbness, as well as not wearing proper footwear, can make you more likely to have a fall.  Depression screening. You may be more likely to have a fall if you have a fear of falling, feel emotionally low, or feel unable to do activities that you used to do.  Alcohol use screening. Using too much alcohol can affect your balance and may make you more likely to have a fall. What actions can I take to lower my risk of falls? General instructions  Talk with your health care provider about your risks for falling. Tell your health care provider if: ? You fall. Be sure to tell your health care provider about all falls, even ones that seem minor. ? You feel dizzy, sleepy, or off-balance.  Take over-the-counter and prescription medicines only as told by your health care provider. These include any supplements.  Eat a healthy diet and maintain a healthy weight. A healthy diet includes low-fat dairy products, low-fat (lean) meats, and fiber  from whole grains, beans, and lots of fruits and vegetables. Home safety  Remove any tripping hazards, such as rugs, cords, and clutter.  Install safety equipment such as grab bars in bathrooms and safety rails on stairs.  Keep rooms and walkways well-lit. Activity   Follow a regular exercise program to stay fit. This will help you maintain your balance. Ask your health care provider what types of exercise are appropriate for you.  If you  need a cane or walker, use it as recommended by your health care provider.  Wear supportive shoes that have nonskid soles. Lifestyle  Do not drink alcohol if your health care provider tells you not to drink.  If you drink alcohol, limit how much you have: ? 0-1 drink a day for women. ? 0-2 drinks a day for men.  Be aware of how much alcohol is in your drink. In the [place] one drink equals one typical bottle of beer (12 oz), one-half glass of wine (5 oz), or one shot of hard liquor (1 oz).  Do not use any products that contain nicotine or tobacco, such as cigarettes and e-cigarettes. If you need help quitting, ask your health care provider. Summary  Having a healthy lifestyle and getting preventive care can help to protect your health and wellness after age 79.  Screening and testing are the best way to find a health problem early and help you avoid having a fall. Early diagnosis and treatment give you the best chance for managing medical conditions that are more common for people who are older than age 36.  Falls are a major cause of broken bones and head injuries in people who are older than age 84. Take precautions to prevent a fall at home.  Work with your health care provider to learn what changes you can make to improve your health and wellness and to prevent falls. This information is not intended to replace advice given to you by your health care provider. Make sure you discuss any questions you have with your health care provider. Document Revised: 07/16/2018 Document Reviewed: 02/05/2017 Elsevier Patient Education  2020 [employer].

## 2019-05-06 DIAGNOSIS — L989 Disorder of the skin and subcutaneous tissue, unspecified: Secondary | ICD-10-CM | POA: Insufficient documentation

## 2019-05-06 DIAGNOSIS — N3941 Urge incontinence: Secondary | ICD-10-CM | POA: Insufficient documentation

## 2019-05-06 NOTE — Assessment & Plan Note (Signed)
Chronic, mildly elevated - will start lisinopril 2.5mg  daily.

## 2019-05-06 NOTE — Assessment & Plan Note (Signed)
H/o pathologic fractures. prolia started 08/2017. Update DEXA.

## 2019-05-06 NOTE — Assessment & Plan Note (Signed)
Stable period - continue cymbalta.

## 2019-05-06 NOTE — Assessment & Plan Note (Addendum)
Describes exertional chest tightness discomfort, notices under stress and with exertion.  Update EKG, will see if we can expedite her cardiology evaluation for worsening chest discomfort and dyspnea. Continue statin, aspirin and eliquis.

## 2019-05-06 NOTE — Assessment & Plan Note (Addendum)
Possible squamous cell cancer - will refer to derm.

## 2019-05-06 NOTE — Assessment & Plan Note (Signed)
Preventative protocols reviewed and updated unless pt declined. Discussed healthy diet and lifestyle.  

## 2019-05-06 NOTE — Assessment & Plan Note (Signed)
Chronic, stable on daily protonix. H/o gastric ulcer and current eliquis and aspiring use.

## 2019-05-06 NOTE — Assessment & Plan Note (Signed)
Followed by PMR on BID tramadol - see below.

## 2019-05-06 NOTE — Assessment & Plan Note (Signed)
Chronic, stable on atorvastatin - continue The ASCVD Risk score Mikey Bussing DC Jr., et al., 2013) failed to calculate for the following reasons:   The 2013 ASCVD risk score is only valid for ages 20 to 68

## 2019-05-07 NOTE — Assessment & Plan Note (Signed)
Continue replacement 

## 2019-05-07 NOTE — Assessment & Plan Note (Signed)
Mutlifactorial, some improved after vascular intervention - anticipate persistent pain due to neuropathy noted on recent NCS contribution,

## 2019-05-07 NOTE — Assessment & Plan Note (Addendum)
rec schedule miralax for improved constipation.

## 2019-05-07 NOTE — Assessment & Plan Note (Signed)
Continue statin. 

## 2019-05-07 NOTE — Assessment & Plan Note (Signed)
Markedly low - b12 shot today, I recommended continue monthly B12 shots.

## 2019-05-07 NOTE — Assessment & Plan Note (Signed)
Acute worsening despite completing [place] therapy.  Tramadol and gabapentin could contribute - recommend decrease tramadol to BID PRN

## 2019-05-07 NOTE — Assessment & Plan Note (Signed)
Stable period. Check UA r/o infection as cause. Declines medication.

## 2019-05-07 NOTE — Assessment & Plan Note (Signed)
Continue aspirin, eliquis, statin

## 2019-05-07 NOTE — Assessment & Plan Note (Addendum)
[place] CSRS reviewed.  See below for tramadol recommendations.

## 2019-05-07 NOTE — Assessment & Plan Note (Signed)
Severe, noted on NCS. On low dose gabapentin.

## 2019-05-07 NOTE — Assessment & Plan Note (Addendum)
Continue rate control on beta blocker and amiodarone, and continue eliquis.

## 2019-05-11 ENCOUNTER — Ambulatory Visit: Payer: Medicare Other | Admitting: Podiatry

## 2019-05-11 ENCOUNTER — Other Ambulatory Visit: Payer: Self-pay

## 2019-05-11 DIAGNOSIS — M79674 Pain in right toe(s): Secondary | ICD-10-CM

## 2019-05-11 DIAGNOSIS — B351 Tinea unguium: Secondary | ICD-10-CM | POA: Diagnosis not present

## 2019-05-11 DIAGNOSIS — M779 Enthesopathy, unspecified: Secondary | ICD-10-CM | POA: Diagnosis not present

## 2019-05-11 DIAGNOSIS — M21621 Bunionette of right foot: Secondary | ICD-10-CM

## 2019-05-11 DIAGNOSIS — I739 Peripheral vascular disease, unspecified: Secondary | ICD-10-CM

## 2019-05-11 DIAGNOSIS — M79675 Pain in left toe(s): Secondary | ICD-10-CM | POA: Diagnosis not present

## 2019-05-12 ENCOUNTER — Encounter: Payer: Self-pay | Admitting: Podiatry

## 2019-05-12 NOTE — Progress Notes (Signed)
Subjective:  Patient ID: [name], female    DOB: June 01, 1929,  MRN: [id number]  Chief Complaint  Patient presents with  . Nail Problem    pt is here for a 3 month nail trim, and also a callous trim as well.   86 y.o. female returns for the above complaint.  Patient states that these toenails have been very painful and hurts when she is ambulating.  She ambulates with a walker.  She states that she had angiogram done to the both lower extremity which has helped with the circulation.  She states that once the improvement in flow her pain also started getting better with time.  She had a stent placed on the left side.  She has complaints of thickened elongated mycotic toenails x10.  She also states that she has pain to the right fifth metatarsal head/hyperkeratotic lesion.  She states this has been going on for couple of months has progressively gotten worse and now is very painful to ambulate.  She states that there is pressure from the shoes and she is unable to walk properly because of the pain.  She denies any other acute complaints.  Objective:   There were no vitals filed for this visit. Podiatric Exam: Vascular: dorsalis pedis and posterior tibial pulses are palpable bilateral. Capillary return is immediate. Temperature gradient is WNL. Skin turgor WNL  Sensorium: Normal Semmes Weinstein monofilament test. Normal tactile sensation bilaterally. Nail Exam: Pt has thick disfigured discolored nails with subungual debris noted bilateral entire nail hallux through fifth toenails Ulcer Exam: There is no evidence of ulcer or pre-ulcerative changes or infection. Orthopedic Exam: Muscle tone and strength are WNL. No limitations in general ROM. No crepitus or effusions noted. HAV  B/L.  Hammer toes 2-5  B/L.  Tailor's bunion noted on the right lateral aspect of the foot.  Pain on palpation to the right lateral fifth hyperkeratotic lesion.  Pain with mild range of motion of the fifth  metatarsophalangeal joint active and passive. Skin: No Porokeratosis. No infection or ulcers  Assessment & Plan:  Patient was evaluated and treated and all questions answered.  Capsulitis secondary to tailor's bunion -I explained to the patient the etiology of capsulitis due to tailor's bunion and various treatment options associated with it.  I believe patient will benefit from a steroid injection to decrease the inflammatory component of the capsulitis and see if therefore give her some pain relief when ambulating.  However I explained to the patient that the only ultimate way to really correct the pain that she is getting is from surgical intervention to help reduce the tailor's bunion and therefore the pressure.  However given her history of peripheral vascular disease I will need to first speak with the vascular surgeon that is following her for clearance for surgery if considered in the future.  Onychomycosis with pain  -Nails palliatively debrided as below. -Educated on self-care  Procedure: Nail Debridement Rationale: pain  Type of Debridement: manual, sharp debridement. Instrumentation: Nail nipper, rotary burr. Number of Nails: 10  Xerosis of bilateral lower extremity -Slowly improving   Procedures and Treatment: Consent by patient was obtained for treatment procedures. The patient understood the discussion of treatment and procedures well. All questions were answered thoroughly reviewed. Debridement of mycotic and hypertrophic toenails, 1 through 5 bilateral and clearing of subungual debris. No ulceration, no infection noted.  Return Visit-Office Procedure: Patient instructed to return to the office for a follow up visit 3 months for continued evaluation  and treatment.  Boneta Lucks, DPM    No follow-ups on file.

## 2019-05-14 ENCOUNTER — Encounter: Payer: Self-pay | Admitting: Cardiovascular Disease

## 2019-05-14 ENCOUNTER — Telehealth (INDEPENDENT_AMBULATORY_CARE_PROVIDER_SITE_OTHER): Payer: Medicare Other | Admitting: Cardiovascular Disease

## 2019-05-14 ENCOUNTER — Other Ambulatory Visit: Payer: Self-pay

## 2019-05-14 ENCOUNTER — Telehealth: Payer: Self-pay

## 2019-05-14 VITALS — BP 152/70 | HR 64 | Ht 61.25 in | Wt 139.3 lb

## 2019-05-14 DIAGNOSIS — I5032 Chronic diastolic (congestive) heart failure: Secondary | ICD-10-CM | POA: Diagnosis not present

## 2019-05-14 DIAGNOSIS — Z79899 Other long term (current) drug therapy: Secondary | ICD-10-CM

## 2019-05-14 DIAGNOSIS — I4892 Unspecified atrial flutter: Secondary | ICD-10-CM | POA: Diagnosis not present

## 2019-05-14 DIAGNOSIS — I739 Peripheral vascular disease, unspecified: Secondary | ICD-10-CM

## 2019-05-14 DIAGNOSIS — I10 Essential (primary) hypertension: Secondary | ICD-10-CM

## 2019-05-14 DIAGNOSIS — I4819 Other persistent atrial fibrillation: Secondary | ICD-10-CM | POA: Diagnosis not present

## 2019-05-14 DIAGNOSIS — I110 Hypertensive heart disease with heart failure: Secondary | ICD-10-CM

## 2019-05-14 DIAGNOSIS — E785 Hyperlipidemia, unspecified: Secondary | ICD-10-CM | POA: Diagnosis not present

## 2019-05-14 DIAGNOSIS — R0602 Shortness of breath: Secondary | ICD-10-CM

## 2019-05-14 DIAGNOSIS — Z7901 Long term (current) use of anticoagulants: Secondary | ICD-10-CM

## 2019-05-14 DIAGNOSIS — R0789 Other chest pain: Secondary | ICD-10-CM

## 2019-05-14 NOTE — Progress Notes (Signed)
Virtual Visit via Video Note   This visit type was conducted due to national recommendations for restrictions regarding the COVID-19 Pandemic (e.g. social distancing) in an effort to limit this patient's exposure and mitigate transmission in our community.  Due to her co-morbid illnesses, this patient is at least at moderate risk for complications without adequate follow up.  This format is felt to be most appropriate for this patient at this time.  All issues noted in this document were discussed and addressed.  A limited physical exam was performed with this format.  Please refer to the patient's chart for her consent to telehealth for [facility].   Date:  05/14/2019   ID:  [name], DOB [birth year], MRN [id number]  Patient Location: Home Provider Location: Office  PCP:  Ria Bush, MD  Cardiologist:  [name], MD  Electrophysiologist:  None   Evaluation Performed:  Follow-Up Visit  Chief Complaint: Chest pain and shortness of breath  History of Present Illness:    [name] is a 86 y.o. female was seen via video visit for evaluation of chest pain and worsening shortness of breath.  The patient is hard of hearing and I had to communicate with the help of her daughter who was present. She has known history of persistent atrial fibrillation/flutter maintaining sinus rhythm with amiodarone, chronic dyspnea and fatigue, essential hypertension, hyperlipidemia, rheumatoid arthritis and peripheral arterial disease.   Most recent echocardiogram in December 2019 showed normal LV systolic function, mild aortic and mitral regurgitation, moderately dilated left atrium and moderate pulmonary hypertension with systolic pressure of 63 mmHg. The patient has been on small dose furosemide for chronic diastolic heart failure with moderate pulmonary hypertension. She had a Lexiscan Myoview in June 2020 which showed normal LV systolic function with no evidence of ischemia or  scar. She had bilateral foot discomfort felt to be multifactorial due to neuropathy and leg swelling.  She was found to have tibial disease and underwent angioplasty on bilateral tibial vessels by Dr. Lucky Cowboy.  She reports some improvement in symptoms but not complete resolution.  Recently, she had 2 episodes of chest pain that lasted for about 5 to 15 minutes.  Both were at rest and they were after she ate.  She also reports worsening exertional dyspnea.  No significant leg edema or change in her weight.  She feels more tired than before.  The patient does not have symptoms concerning for COVID-19 infection (fever, chills, cough, or new shortness of breath).    Past Medical History:  Diagnosis Date  . Abdominal aortic atherosclerosis ([place]) 09/2015   by xray  . Anxiety   . BCC (basal cell carcinoma of skin) 05/2011   on nose  . Breast lesion    a. diagnosed with complex sclerosing lesion with calcifications of the right breast in 08/2016  . Chest pain    a. nuclear stress test 03/2012: normal; b. 09/2018 MV: EF >65%, no isch/scar.   . DDD (degenerative disc disease), lumbosacral 12/06/1992  . Gallstones 09/2015   incidentally by xray  . GERD (gastroesophageal reflux disease) 05/1999  . HERPES ZOSTER 04/13/2007   Qualifier: Diagnosis of  By: [name], [name]   . Hiatal hernia   . History of shingles    ophthalmic, takes acyclovir daily  . Hyperlipidemia 12/2003  . Hypertension 1990  . Mitral regurgitation    a. echo 03/2012: EF 60-65%, no RWMA, mild to mod AI/MR, mod TR, PASP 37 mmHg  .  Osteoarthritis 08/21/1989  . Osteoporosis with fracture 11/1997   with compression fracture T12  . Persistent atrial fibrillation ([place]) 03/16/2012   a. CHADS2VASc => 5 (HTN, age x 2, vascular disease, sex category)-->Eliquis 5 BID;  b. Recurrent AF in 06/2016;  c. 01/2017 s/p DCCV;  d. 02/2017 recurrent AFib->Amio added->DCCV; e. 03/2017 recurrent AFib, s/p reload of amio and DCCV 04/02/2017  .  Rheumatoid arthritis ([place])   . RLS (restless legs syndrome)    Past Surgical History:  Procedure Laterality Date  . ABDOMINAL HYSTERECTOMY  Age 68 - 54   S/P TAH  . abdominal ultrasound  04/23/2004   Gallstones  . BREAST LUMPECTOMY WITH RADIOACTIVE SEED LOCALIZATION Right 10/2016   breast lumpectomy with radioactive seed localization and margin assessment for complex sclerosing lesion [facility])  . BREAST LUMPECTOMY WITH RADIOACTIVE SEED LOCALIZATION Right 11/05/2016   Procedure: RIGHT BREAST LUMPECTOMY WITH RADIOACTIVE SEED LOCALIZATION;  Surgeon: Fanny Skates, MD;  Location: [facility];  Service: General;  Laterality: Right;  . CARDIOVERSION N/A 01/31/2017   Procedure: CARDIOVERSION;  Surgeon: Wellington Hampshire, MD;  Location: [facility];  Service: Cardiovascular;  Laterality: N/A;  . CARDIOVERSION N/A 03/03/2017   Procedure: CARDIOVERSION;  Surgeon: Wellington Hampshire, MD;  Location: [facility];  Service: Cardiovascular;  Laterality: N/A;  . CARDIOVERSION N/A 04/02/2017   Procedure: CARDIOVERSION;  Surgeon: Wellington Hampshire, MD;  Location: [facility];  Service: Cardiovascular;  Laterality: N/A;  . CARDIOVERSION N/A 01/16/2018   Procedure: CARDIOVERSION;  Surgeon: Wellington Hampshire, MD;  Location: [facility];  Service: Cardiovascular;  Laterality: N/A;  . CATARACT EXTRACTION  05/2010   left eye  . CERVICAL DISCECTOMY  1992   Fusion (Dr. Joya Salm)  . ESI Bilateral 01/2016   S1 transforaminal ESI x3  . ESOPHAGOGASTRODUODENOSCOPY  01/01/2002   with ulcer, bx. neg; + stricture gastric ulcer with hemorrhage  . ESOPHAGOGASTRODUODENOSCOPY  04/23/2004   Esophageal stricture -- dilated  . LOWER EXTREMITY ANGIOGRAPHY Left 02/01/2019   LOWER EXTREMITY ANGIOGRAPHY;  Surgeon: Algernon Huxley, MD  . LOWER EXTREMITY ANGIOGRAPHY Right 03/25/2019   Procedure: LOWER EXTREMITY ANGIOGRAPHY;  Surgeon: Algernon Huxley, MD;  Location: [facility];  Service: Cardiovascular;  Laterality: Right;   . nuclear stress test  03/2012   no ischemia  . PERIPHERAL VASCULAR BALLOON ANGIOPLASTY Left 01/2019   Percutaneous transluminal angioplasty of left anterior and posterior tibial artery with 2.5 mm diameter by 22 cm length angioplasty balloon ([place])  . SKIN CANCER EXCISION  05/20/2011   BCC from nose  . US ECHOCARDIOGRAPHY  03/2012   in flutter, EF 60%, mild-mod aortic, mitral, tricuspid regurg, mildly dilated LA     Current Meds  Medication Sig  . acetaminophen (TYLENOL) 500 MG tablet Take 1,000 mg every 8 (eight) hours as needed by mouth for moderate pain.   Marland Kitchen acyclovir (ZOVIRAX) 400 MG tablet Take 400 mg 2 (two) times daily by mouth.   Marland Kitchen amiodarone (PACERONE) 200 MG tablet TAKE 1 TABLET BY MOUTH DAILY  . aspirin EC 81 MG tablet Take 1 tablet (81 mg total) by mouth daily.  Marland Kitchen atorvastatin (LIPITOR) 20 MG tablet TAKE ONE TABLET BY MOUTH EVERY DAY  . DULoxetine (CYMBALTA) 60 MG capsule TAKE 1 CAPSULE BY MOUTH EVERY DAY  . ELIQUIS 2.5 MG TABS tablet TAKE ONE TABLET BY MOUTH TWICE DAILY  . ergocalciferol (VITAMIN D2) 1.25 MG (50000 UT) capsule Take 50,000 Units by mouth once a week.  . furosemide (LASIX) 20 MG  tablet Take 1 tablet (20 mg total) by mouth daily. Take 1 tablet daily and may take 1 extra tablet as needed for swelling up to 3x per week  . gabapentin (NEURONTIN) 300 MG capsule Take 1 capsule (300 mg total) by mouth at bedtime.  Marland Kitchen lisinopril (ZESTRIL) 2.5 MG tablet Take 1 tablet (2.5 mg total) by mouth daily.  . metoprolol tartrate (LOPRESSOR) 25 MG tablet Take 0.5 tablets (12.5 mg total) by mouth 2 (two) times daily.  . pantoprazole (PROTONIX) 40 MG tablet Take 1 tablet (40 mg total) by mouth daily.  . potassium chloride (KLOR-CON) 10 MEQ tablet Take 2 tablets (20 mEq total) by mouth daily.  . prednisoLONE acetate (PRED FORTE) 1 % ophthalmic suspension Place 1 drop into the left eye daily.   Marland Kitchen rOPINIRole (REQUIP) 1 MG tablet TAKE TWO TABLETS BY MOUTH AT BEDTIME  . tiZANidine  (ZANAFLEX) 2 MG tablet Take 2 mg by mouth every evening. Takes 1 tablet daily  . traMADol (ULTRAM) 50 MG tablet Take 1 tablet (50 mg total) by mouth 2 (two) times daily.     Allergies:   Penicillins   Social History   Tobacco Use  . Smoking status: Former Smoker    Packs/day: 0.25    Years: 1.00    Pack years: 0.25    Quit date: 04/08/1976    Years since quitting: 43.1  . Smokeless tobacco: Never Used  . Tobacco comment: pt smokes occasionally "socially"  Substance Use Topics  . Alcohol use: No    Alcohol/week: 1.0 standard drinks    Types: 1 Glasses of wine per week    Comment: occassional  . Drug use: No     Family Hx: The patient's family history includes Cancer in her brother; Diabetes in her brother, brother, brother, and sister; Emphysema in her mother; Heart disease in her brother and father; Hypertension in her brother; Stroke in her brother, father, and sister. There is no history of Colon cancer.  ROS:   Please see the history of present illness.     All other systems reviewed and are negative.   Prior CV studies:   The following studies were reviewed today:  I personally reviewed her most recent lower extremity angiogram.  Labs/Other Tests and Data Reviewed:    EKG:  No ECG reviewed.  Recent Labs: 08/03/2018: TSH 2.30 09/25/2018: B Natriuretic Peptide 75.0 04/28/2019: ALT 19; BUN 16; Creatinine, Ser 0.92; Hemoglobin 13.0; Platelets 252.0; Potassium 3.8; Sodium 139   Recent Lipid Panel Lab Results  Component Value Date/Time   CHOL 134 04/28/2019 01:31 PM   TRIG 85.0 04/28/2019 01:31 PM   HDL 67.20 04/28/2019 01:31 PM   CHOLHDL 2 04/28/2019 01:31 PM   LDLCALC 50 04/28/2019 01:31 PM   LDLDIRECT 173.1 02/19/2010 09:08 AM    Wt Readings from Last 3 Encounters:  05/14/19 139 lb 5 oz (63.2 kg)  05/05/19 139 lb (63 kg)  04/23/19 137 lb (62.1 kg)     Objective:    Vital Signs:  BP (!) 152/70   Pulse 64   Ht 5' 1.25" (1.556 m)   Wt 139 lb 5 oz (63.2  kg)   BMI 26.11 kg/m    VITAL SIGNS:  reviewed  ASSESSMENT & PLAN:     1.  Paroxysmal atrial flutter and persistent atrial fibrillation: Maintaining in sinus rhythm with amiodarone 200 mg once daily and metoprolol 12.5 mg twice daily.  Continue anticoagulation with Eliquis 2.5 mg twice daily.    2.  Essential hypertension: Blood pressure has been elevated and she was recently started on lisinopril 2.5 mg once daily by Dr. Danise Mina.  She has not started the medication yet  3. Hyperlipidemia: Currently on atorvastatin 20 mg once daily with most recent LDL of 75.  4.  Chronic diastolic heart failure: Her weight has been stable with no significant leg edema on current dose of furosemide 20 mg once daily.  However, she reports significant worsening of exertional dyspnea and thus I am going to repeat her echocardiogram to follow-up on her pulmonary hypertension.  5.  Atypical chest pain: Negative Lexiscan Myoview last year.  We will bring her in for an echocardiogram and EKG.  If there is significant worsening of pulmonary hypertension or persistent symptoms, she might require a right and left cardiac catheterization.  6.  Peripheral arterial disease: Status post tibial vessel intervention managed by Dr. Lucky Cowboy.    COVID-19 Education: The signs and symptoms of COVID-19 were discussed with the patient and how to seek care for testing (follow up with PCP or arrange E-visit).  The importance of social distancing was discussed today.  Time:   Today, I have spent 10 minutes with the patient with telehealth technology discussing the above problems.     Medication Adjustments/Labs and Tests Ordered: Current medicines are reviewed at length with the patient today.  Concerns regarding medicines are outlined above.   Tests Ordered: No orders of the defined types were placed in this encounter.   Medication Changes: No orders of the defined types were placed in this encounter.   Follow Up:  In  Person in 2 week(s)  Signed, [name], MD  05/14/2019 7:59 AM    [place]

## 2019-05-14 NOTE — Telephone Encounter (Signed)
Virtual Visit Pre-Appointment Phone Call  [name]), I am calling you today to discuss your upcoming appointment. We are currently trying to limit exposure to the virus that causes COVID-19 by seeing patients at home rather than in the office."  1. "What is the BEST phone number to call the day of the visit?" - include this in appointment notes  2. "Do you have or have access to (through a family member/friend) a smartphone with video capability that we can use for your visit?" a. If yes - list this number in appt notes as "cell" (if different from BEST phone #) and list the appointment type as a VIDEO visit in appointment notes b. If no - list the appointment type as a PHONE visit in appointment notes  3. Confirm consent - "In the setting of the current Covid19 crisis, you are scheduled for a (phone or video) visit with your provider on (date) at (time).  Just as we do with many in-office visits, in order for you to participate in this visit, we must obtain consent.  If you'd like, I can send this to your mychart (if signed up) or email for you to review.  Otherwise, I can obtain your verbal consent now.  All virtual visits are billed to your insurance company just like a normal visit would be.  By agreeing to a virtual visit, we'd like you to understand that the technology does not allow for your provider to perform an examination, and thus may limit your provider's ability to fully assess your condition. If your provider identifies any concerns that need to be evaluated in person, we will make arrangements to do so.  Finally, though the technology is pretty good, we cannot assure that it will always work on either your or our end, and in the setting of a video visit, we may have to convert it to a phone-only visit.  In either situation, we cannot ensure that we have a secure connection.  Are you willing to proceed?" STAFF: Did the patient verbally acknowledge consent to telehealth visit? Document  YES/NO here: YES   4. Advise patient to be prepared - "Two hours prior to your appointment, go ahead and check your blood pressure, pulse, oxygen saturation, and your weight (if you have the equipment to check those) and write them all down. When your visit starts, your provider will ask you for this information. If you have an Apple Watch or Kardia device, please plan to have heart rate information ready on the day of your appointment. Please have a pen and paper handy nearby the day of the visit as well."  5. Give patient instructions for MyChart download to smartphone OR Doximity/Doxy.me as below if video visit (depending on what platform provider is using)  6. Inform patient they will receive a phone call 15 minutes prior to their appointment time (may be from unknown caller ID) so they should be prepared to answer    TELEPHONE CALL NOTE  [name] has been deemed a candidate for a follow-up tele-health visit to limit community exposure during the Covid-19 pandemic. I spoke with the patient via phone to ensure availability of phone/video source, confirm preferred email & phone number, and discuss instructions and expectations.  I reminded [name] to be prepared with any vital sign and/or heart rhythm information that could potentially be obtained via home monitoring, at the time of her visit. I reminded [name] to expect a phone call prior  to her visit.  [name], [facility] 05/14/2019 8:24 AM   INSTRUCTIONS FOR DOWNLOADING THE MYCHART APP TO SMARTPHONE  - The patient must first make sure to have activated MyChart and know their login information - If Apple, go to [employer] and type in MyChart in the search bar and download the app. If Android, ask patient to go to [employer] and type in [place] in the search bar and download the app. The app is free but as with any other app downloads, their phone may require them to verify saved payment information or  Apple/Android password.  - The patient will need to then log into the app with their MyChart username and password, and select [facility] as their healthcare provider to link the account. When it is time for your visit, go to the MyChart app, find appointments, and click Begin Video Visit. Be sure to Select Allow for your device to access the Microphone and Camera for your visit. You will then be connected, and your provider will be with you shortly.  **If they have any issues connecting, or need assistance please contact MyChart service desk [phone])**  **If using a computer, in order to ensure the best quality for their visit they will need to use either of the following Internet Browsers: [employer], or Google Chrome**  IF USING DOXIMITY or DOXY.ME - The patient will receive a link just prior to their visit by text.     FULL LENGTH CONSENT FOR TELE-HEALTH VISIT   I hereby voluntarily request, consent and authorize [facility] and its employed or contracted physicians, physician assistants, nurse practitioners or other licensed health care professionals (the Practitioner), to provide me with telemedicine health care services (the "Services") as deemed necessary by the treating Practitioner. I acknowledge and consent to receive the Services by the Practitioner via telemedicine. I understand that the telemedicine visit will involve communicating with the Practitioner through live audiovisual communication technology and the disclosure of certain medical information by electronic transmission. I acknowledge that I have been given the opportunity to request an in-person assessment or other available alternative prior to the telemedicine visit and am voluntarily participating in the telemedicine visit.  I understand that I have the right to withhold or withdraw my consent to the use of telemedicine in the course of my care at any time, without affecting my right to future care  or treatment, and that the Practitioner or I may terminate the telemedicine visit at any time. I understand that I have the right to inspect all information obtained and/or recorded in the course of the telemedicine visit and may receive copies of available information for a reasonable fee.  I understand that some of the potential risks of receiving the Services via telemedicine include:  Marland Kitchen Delay or interruption in medical evaluation due to technological equipment failure or disruption; . Information transmitted may not be sufficient (e.g. poor resolution of images) to allow for appropriate medical decision making by the Practitioner; and/or  . In rare instances, security protocols could fail, causing a breach of personal health information.  Furthermore, I acknowledge that it is my responsibility to provide information about my medical history, conditions and care that is complete and accurate to the best of my ability. I acknowledge that Practitioner's advice, recommendations, and/or decision may be based on factors not within their control, such as incomplete or inaccurate data provided by me or distortions of diagnostic images or specimens that may result from electronic transmissions.  I understand that the practice of medicine is not an exact science and that Practitioner makes no warranties or guarantees regarding treatment outcomes. I acknowledge that I will receive a copy of this consent concurrently upon execution via email to the email address I last provided but may also request a printed copy by calling the office of [facility].    I understand that my insurance will be billed for this visit.   I have read or had this consent read to me. . I understand the contents of this consent, which adequately explains the benefits and risks of the Services being provided via telemedicine.  . I have been provided ample opportunity to ask questions regarding this consent and the Services and have had  my questions answered to my satisfaction. . I give my informed consent for the services to be provided through the use of telemedicine in my medical care  By participating in this telemedicine visit I agree to the above.

## 2019-05-14 NOTE — Patient Instructions (Signed)
Medication Instructions:  Your physician recommends that you continue on your current medications as directed. Please refer to the Current Medication list given to you today.  *If you need a refill on your cardiac medications before your next appointment, please call your pharmacy*  Lab Work: None ordered If you have labs (blood work) drawn today and your tests are completely normal, you will receive your results only by: Marland Kitchen MyChart Message (if you have MyChart) OR . A paper copy in the mail If you have any lab test that is abnormal or we need to change your treatment, we will call you to review the results.  Testing/Procedures: Your physician has requested that you have an echocardiogram. Echocardiography is a painless test that uses sound waves to create images of your heart. It provides your doctor with information about the size and shape of your heart and how well your heart's chambers and valves are working. This procedure takes approximately one hour. There are no restrictions for this procedure. (To be scheduled with in the next 2 weeks)  Follow-Up: At [facility], you and your health needs are our priority.  As part of our continuing mission to provide you with exceptional heart care, we have created designated Provider Care Teams.  These Care Teams include your primary Cardiologist (physician) and Advanced Practice Providers (APPs -  Physician Assistants and Nurse Practitioners) who all work together to provide you with the care you need, when you need it.  Your next appointment:   Same day as the echo   The format for your next appointment:   In Person  Provider:    You may see [name], MD or one of the following Advanced Practice Providers on your designated Care Team:     Other Instructions N/A

## 2019-05-17 ENCOUNTER — Telehealth: Payer: Self-pay | Admitting: Family Medicine

## 2019-05-17 MED ORDER — ERGOCALCIFEROL 1.25 MG (50000 UT) PO CAPS: 50000.0000 [IU] | ORAL_CAPSULE | ORAL | 3 refills | Status: DC

## 2019-05-17 NOTE — Telephone Encounter (Signed)
Pt's daughter called to request a refill on vitamin d to be sent to [facility].

## 2019-05-17 NOTE — Telephone Encounter (Signed)
E-scribed refill 

## 2019-05-17 NOTE — Addendum Note (Signed)
Addended by: Brenton Grills on: [id number] PM   Modules accepted: Orders

## 2019-05-18 ENCOUNTER — Ambulatory Visit: Payer: Medicare Other | Admitting: Podiatry

## 2019-05-25 ENCOUNTER — Telehealth: Payer: Self-pay

## 2019-05-25 NOTE — Telephone Encounter (Signed)
Glad she's decided to get the covid vaccine. Should be ok to do on her meds.  Please sign her up through [email or web address] website.  As far as derm appt - I believe this has been scheduled already - will forward to [facility] to notify pt.

## 2019-05-25 NOTE — Telephone Encounter (Signed)
[name] left v/m that pt has agreed to take the covid vaccine; [name] wants to verify with Dr Darnell Level OK for pt to take covid vaccine and if so request cb with info on how to schedule covid vaccine. Also [name] thought Dr Darnell Level was going to do dermatology referral at last appt for skin tags on chest and back. [name] request cb with appt info for dermatology appt. I do see in note for annual on 05/05/2019 a mention of dermatology referral for possible squamous cell cancer.Please advise.

## 2019-05-26 NOTE — Telephone Encounter (Signed)
Spoke with the patient's daughter Appt has been made .

## 2019-05-26 NOTE — Telephone Encounter (Signed)
LVM for pt's daughter that pt has been added to [facility] wait list.

## 2019-05-28 ENCOUNTER — Ambulatory Visit (INDEPENDENT_AMBULATORY_CARE_PROVIDER_SITE_OTHER): Payer: Medicare Other

## 2019-05-28 ENCOUNTER — Encounter: Payer: Self-pay | Admitting: Cardiovascular Disease

## 2019-05-28 ENCOUNTER — Ambulatory Visit (INDEPENDENT_AMBULATORY_CARE_PROVIDER_SITE_OTHER): Payer: Medicare Other | Admitting: Cardiovascular Disease

## 2019-05-28 ENCOUNTER — Other Ambulatory Visit: Payer: Self-pay

## 2019-05-28 VITALS — BP 150/84 | HR 60 | Ht 62.0 in | Wt 140.0 lb

## 2019-05-28 DIAGNOSIS — E785 Hyperlipidemia, unspecified: Secondary | ICD-10-CM

## 2019-05-28 DIAGNOSIS — I10 Essential (primary) hypertension: Secondary | ICD-10-CM | POA: Diagnosis not present

## 2019-05-28 DIAGNOSIS — I5033 Acute on chronic diastolic (congestive) heart failure: Secondary | ICD-10-CM

## 2019-05-28 DIAGNOSIS — I4819 Other persistent atrial fibrillation: Secondary | ICD-10-CM

## 2019-05-28 DIAGNOSIS — R0602 Shortness of breath: Secondary | ICD-10-CM | POA: Diagnosis not present

## 2019-05-28 MED ORDER — FUROSEMIDE 40 MG PO TABS: 40.0000 mg | ORAL_TABLET | Freq: Every day | ORAL | 5 refills | Status: DC

## 2019-05-28 MED ORDER — AMIODARONE HCL 100 MG PO TABS: 100.0000 mg | ORAL_TABLET | Freq: Every day | ORAL | Status: DC

## 2019-05-28 MED ORDER — LISINOPRIL 10 MG PO TABS: 10.0000 mg | ORAL_TABLET | Freq: Every day | ORAL | 5 refills | Status: DC

## 2019-05-28 NOTE — Patient Instructions (Signed)
Medication Instructions:  Your physician has recommended you make the following change in your medication:  1) DECREASE Amiodarone to 100 mg daily. 2) INCREASE Lisinopril to 10 mg daily. An Rx has been sent to your pharmacy. 3) INCREASE Furosemide (Lasix) to 40 mg daily. An Rx has been sent to your pharmacy.  *If you need a refill on your cardiac medications before your next appointment, please call your pharmacy*  Lab Work: Your physician recommends that you return for lab work in: 1 week (bmet)  Please have your lab drawn at the [facility]. You do not need an appointment. Their hours are Mon-Fri 7am-6pm.  If you have labs (blood work) drawn today and your tests are completely normal, you will receive your results only by: Marland Kitchen MyChart Message (if you have MyChart) OR . A paper copy in the mail If you have any lab test that is abnormal or we need to change your treatment, we will call you to review the results.  Testing/Procedures: None ordered  Follow-Up: At [facility], you and your health needs are our priority.  As part of our continuing mission to provide you with exceptional heart care, we have created designated Provider Care Teams.  These Care Teams include your primary Cardiologist (physician) and Advanced Practice Providers (APPs -  Physician Assistants and Nurse Practitioners) who all work together to provide you with the care you need, when you need it.  Your next appointment:   4 week(s)  The format for your next appointment:   In Person  Provider:    You may see [name], MD or one of the following Advanced Practice Providers on your designated Care Team:    Murray Hodgkins, NP  Christell Faith, PA-C  Marrianne Mood, PA-C   Other Instructions N/A

## 2019-05-28 NOTE — Progress Notes (Signed)
Cardiology Office Note   Date:  05/28/2019   ID:  [name], DOB Feb 12, 1930, MRN [id number]  PCP:  Ria Bush, MD  Cardiologist:   [name], MD   Chief Complaint  Patient presents with  . other    2 week follow up and just had her Echo. Meds reviewed by the pt. verbally. Pt. c/o shortness of breath and chest pain/pressure.       History of Present Illness: [name] is a 86 y.o. female who presents for evaluation of chest pain and worsening shortness of breath.  The patient is hard of hearing and I had to communicate with the help of her daughter who was present. She has known history of persistent atrial fibrillation/flutter maintaining sinus rhythm with amiodarone, chronic dyspnea and fatigue, essential hypertension, hyperlipidemia, rheumatoid arthritis and peripheral arterial disease.   Most recent echocardiogram in December 2019 showed normal LV systolic function, mild aortic and mitral regurgitation, moderately dilated left atrium and moderate pulmonary hypertension with systolic pressure of 63 mmHg. The patient has been on small dose furosemide for chronic diastolic heart failure with moderate pulmonary hypertension. She had a Lexiscan Myoview in June 2020 which showed normal LV systolic function with no evidence of ischemia or scar. She had bilateral foot discomfort felt to be multifactorial due to neuropathy and leg swelling.  She was found to have tibial disease and underwent angioplasty on bilateral tibial vessels by Dr. Lucky Cowboy.  She reports  improvement in symptoms.  Her blood pressure has been elevated and she was recently started on small dose lisinopril by Dr. Danise Mina.  She was seen virtually recently for significant worsening of exertional dyspnea and fatigue.  Thus, I elected to bring her for an echocardiogram which was done today and personally reviewed by me.  It showed an EF of 60 to 123456, grade 2 diastolic dysfunction, moderate mitral  regurgitation, mild tricuspid regurgitation, mild aortic regurgitation and moderate pulmonary hypertension with estimated pulmonary pressure between 60 to 65 mmHg.     Past Medical History:  Diagnosis Date  . Abdominal aortic atherosclerosis ([place]) 09/2015   by xray  . Anxiety   . BCC (basal cell carcinoma of skin) 05/2011   on nose  . Breast lesion    a. diagnosed with complex sclerosing lesion with calcifications of the right breast in 08/2016  . Chest pain    a. nuclear stress test 03/2012: normal; b. 09/2018 MV: EF >65%, no isch/scar.   . DDD (degenerative disc disease), lumbosacral 12/06/1992  . Gallstones 09/2015   incidentally by xray  . GERD (gastroesophageal reflux disease) 05/1999  . HERPES ZOSTER 04/13/2007   Qualifier: Diagnosis of  By: [name], [name]   . Hiatal hernia   . History of shingles    ophthalmic, takes acyclovir daily  . Hyperlipidemia 12/2003  . Hypertension 1990  . Mitral regurgitation    a. echo 03/2012: EF 60-65%, no RWMA, mild to mod AI/MR, mod TR, PASP 37 mmHg  . Osteoarthritis 08/21/1989  . Osteoporosis with fracture 11/1997   with compression fracture T12  . Persistent atrial fibrillation ([place]) 03/16/2012   a. CHADS2VASc => 5 (HTN, age x 2, vascular disease, sex category)-->Eliquis 5 BID;  b. Recurrent AF in 06/2016;  c. 01/2017 s/p DCCV;  d. 02/2017 recurrent AFib->Amio added->DCCV; e. 03/2017 recurrent AFib, s/p reload of amio and DCCV 04/02/2017  . Rheumatoid arthritis ([place])   . RLS (restless legs syndrome)     Past Surgical  History:  Procedure Laterality Date  . ABDOMINAL HYSTERECTOMY  Age 40 - 41   S/P TAH  . abdominal ultrasound  04/23/2004   Gallstones  . BREAST LUMPECTOMY WITH RADIOACTIVE SEED LOCALIZATION Right 10/2016   breast lumpectomy with radioactive seed localization and margin assessment for complex sclerosing lesion [facility])  . BREAST LUMPECTOMY WITH RADIOACTIVE SEED LOCALIZATION Right 11/05/2016   Procedure: RIGHT  BREAST LUMPECTOMY WITH RADIOACTIVE SEED LOCALIZATION;  Surgeon: Fanny Skates, MD;  Location: [place] [facility];  Service: General;  Laterality: Right;  . CARDIOVERSION N/A 01/31/2017   Procedure: CARDIOVERSION;  Surgeon: Wellington Hampshire, MD;  Location: [facility];  Service: Cardiovascular;  Laterality: N/A;  . CARDIOVERSION N/A 03/03/2017   Procedure: CARDIOVERSION;  Surgeon: Wellington Hampshire, MD;  Location: [place] [facility];  Service: Cardiovascular;  Laterality: N/A;  . CARDIOVERSION N/A 04/02/2017   Procedure: CARDIOVERSION;  Surgeon: Wellington Hampshire, MD;  Location: [facility];  Service: Cardiovascular;  Laterality: N/A;  . CARDIOVERSION N/A 01/16/2018   Procedure: CARDIOVERSION;  Surgeon: Wellington Hampshire, MD;  Location: [facility];  Service: Cardiovascular;  Laterality: N/A;  . CATARACT EXTRACTION  05/2010   left eye  . CERVICAL DISCECTOMY  1992   Fusion (Dr. Joya Salm)  . ESI Bilateral 01/2016   S1 transforaminal ESI x3  . ESOPHAGOGASTRODUODENOSCOPY  01/01/2002   with ulcer, bx. neg; + stricture gastric ulcer with hemorrhage  . ESOPHAGOGASTRODUODENOSCOPY  04/23/2004   Esophageal stricture -- dilated  . LOWER EXTREMITY ANGIOGRAPHY Left 02/01/2019   LOWER EXTREMITY ANGIOGRAPHY;  Surgeon: Algernon Huxley, MD  . LOWER EXTREMITY ANGIOGRAPHY Right 03/25/2019   Procedure: LOWER EXTREMITY ANGIOGRAPHY;  Surgeon: Algernon Huxley, MD;  Location: [place] [facility];  Service: Cardiovascular;  Laterality: Right;  . nuclear stress test  03/2012   no ischemia  . PERIPHERAL VASCULAR BALLOON ANGIOPLASTY Left 01/2019   Percutaneous transluminal angioplasty of left anterior and posterior tibial artery with 2.5 mm diameter by 22 cm length angioplasty balloon ([place])  . SKIN CANCER EXCISION  05/20/2011   BCC from nose  . US ECHOCARDIOGRAPHY  03/2012   in flutter, EF 60%, mild-mod aortic, mitral, tricuspid regurg, mildly dilated LA     Current Outpatient Medications  Medication Sig Dispense Refill  .  acetaminophen (TYLENOL) 500 MG tablet Take 1,000 mg every 8 (eight) hours as needed by mouth for moderate pain.     Marland Kitchen acyclovir (ZOVIRAX) 400 MG tablet Take 400 mg 2 (two) times daily by mouth.     Marland Kitchen amiodarone (PACERONE) 200 MG tablet TAKE 1 TABLET BY MOUTH DAILY 90 tablet 3  . aspirin EC 81 MG tablet Take 1 tablet (81 mg total) by mouth daily. 150 tablet 2  . atorvastatin (LIPITOR) 20 MG tablet TAKE ONE TABLET BY MOUTH EVERY DAY 90 tablet 2  . DULoxetine (CYMBALTA) 60 MG capsule TAKE 1 CAPSULE BY MOUTH EVERY DAY 90 capsule 1  . ELIQUIS 2.5 MG TABS tablet TAKE ONE TABLET BY MOUTH TWICE DAILY 180 tablet 1  . ergocalciferol (VITAMIN D2) 1.25 MG (50000 UT) capsule Take 1 capsule (50,000 Units total) by mouth once a week. 12 capsule 3  . furosemide (LASIX) 20 MG tablet Take 1 tablet (20 mg total) by mouth daily. Take 1 tablet daily and may take 1 extra tablet as needed for swelling up to 3x per week 126 tablet 0  . gabapentin (NEURONTIN) 300 MG capsule Take 1 capsule (300 mg total) by mouth at bedtime.    Marland Kitchen lisinopril (  ZESTRIL) 2.5 MG tablet Take 1 tablet (2.5 mg total) by mouth daily. 30 tablet 3  . metoprolol tartrate (LOPRESSOR) 25 MG tablet Take 0.5 tablets (12.5 mg total) by mouth 2 (two) times daily. 90 tablet 2  . pantoprazole (PROTONIX) 40 MG tablet Take 1 tablet (40 mg total) by mouth daily. 90 tablet 0  . potassium chloride (KLOR-CON) 10 MEQ tablet Take 2 tablets (20 mEq total) by mouth daily. 60 tablet 5  . prednisoLONE acetate (PRED FORTE) 1 % ophthalmic suspension Place 1 drop into the left eye daily.     Marland Kitchen rOPINIRole (REQUIP) 1 MG tablet TAKE TWO TABLETS BY MOUTH AT BEDTIME 180 tablet 3  . tiZANidine (ZANAFLEX) 2 MG tablet Take 2 mg by mouth every evening. Takes 1 tablet daily    . traMADol (ULTRAM) 50 MG tablet Take 50 mg by mouth daily.   0   No current facility-administered medications for this visit.    Allergies:   Penicillins    Social History:  The patient  reports that she  quit smoking about 43 years ago. She has a 0.25 pack-year smoking history. She has never used smokeless tobacco. She reports that she does not drink alcohol or use drugs.   Family History:  The patient's family history includes Cancer in her brother; Diabetes in her brother, brother, brother, and sister; Emphysema in her mother; Heart disease in her brother and father; Hypertension in her brother; Stroke in her brother, father, and sister.    ROS:  Please see the history of present illness.   Otherwise, review of systems are positive for none.   All other systems are reviewed and negative.    PHYSICAL EXAM: VS:  BP (!) 150/84 (BP Location: Left Arm, Patient Position: Sitting, Cuff Size: Normal)   Pulse 60   Ht 5\' 2"  (1.575 m)   Wt 140 lb (63.5 kg)   SpO2 98%   BMI 25.61 kg/m  , BMI Body mass index is 25.61 kg/m. GEN: Well nourished, well developed, in no acute distress  HEENT: normal  Neck: no JVD, carotid bruits, or masses Cardiac: Regular but tachycardic,no murmurs, rubs, or gallops, mild bilateral leg edema worse on the left side with chronic stasis dermatitis. Respiratory:  clear to auscultation bilaterally, normal work of breathing GI: soft, nontender, nondistended, + BS MS: no deformity or atrophy  Skin: warm and dry, no rash Neuro:  Strength and sensation are intact Psych: euthymic mood, full affect  EKG:  EKG is ordered today. The ekg ordered today demonstrates normal sinus rhythm with a heart rate of 60 bpm.  Possible old anterior infarct.   Recent Labs: 08/03/2018: TSH 2.30 09/25/2018: B Natriuretic Peptide 75.0 04/28/2019: ALT 19; BUN 16; Creatinine, Ser 0.92; Hemoglobin 13.0; Platelets 252.0; Potassium 3.8; Sodium 139    Lipid Panel    Component Value Date/Time   CHOL 134 04/28/2019 1331   TRIG 85.0 04/28/2019 1331   HDL 67.20 04/28/2019 1331   CHOLHDL 2 04/28/2019 1331   VLDL 17.0 04/28/2019 1331   LDLCALC 50 04/28/2019 1331   LDLDIRECT 173.1 02/19/2010 0908        Wt Readings from Last 3 Encounters:  05/28/19 140 lb (63.5 kg)  05/14/19 139 lb 5 oz (63.2 kg)  05/05/19 139 lb (63 kg)       No flowsheet data found.    ASSESSMENT AND PLAN:  1.   Acute on chronic diastolic heart failure: Echocardiogram today shows grade 2 diastolic dysfunction with moderate pulmonary  hypertension.  I suspect that this is likely contributing to some of her exertional dyspnea.  I elected to increase furosemide to 40 mg once daily.  We also need to control her blood pressure.  I requested basic metabolic profile to be done in 1 week.  If no improvement in symptoms, the next step is to proceed with a right and left cardiac catheterization.    2. Essential hypertension: Blood pressure is still elevated.  I increase lisinopril to 10 mg daily.  3. Hyperlipidemia: Currently on atorvastatin 20 mg once daily with most recent LDL of 75.  4. Paroxysmal atrial flutter and persistent atrial fibrillation: Maintaining in sinus rhythm with amiodarone 200 mg once daily and metoprolol 12.5 mg twice daily.  Continue anticoagulation with Eliquis 2.5 mg twice daily.  I elected to decrease amiodarone to 100 mg daily.  If dyspnea persists with no cardiac explanation, we will need to consider referring her to pulmonary with PFTs.  5.  Peripheral arterial disease: Status post tibial vessel intervention managed by Dr. Lucky Cowboy.   Disposition:   FU with me in 1 months  Signed,  [name], MD  05/28/2019 4:00 PM    [place]

## 2019-06-03 ENCOUNTER — Ambulatory Visit: Payer: Medicare Other | Admitting: Cardiovascular Disease

## 2019-06-04 ENCOUNTER — Other Ambulatory Visit
Admission: RE | Admit: 2019-06-04 | Discharge: 2019-06-04 | Disposition: A | Discharge status: Home / Self Care | Payer: Medicare Other | Attending: Cardiovascular Disease | Admitting: Cardiovascular Disease

## 2019-06-04 DIAGNOSIS — L821 Other seborrheic keratosis: Secondary | ICD-10-CM | POA: Diagnosis not present

## 2019-06-04 DIAGNOSIS — I10 Essential (primary) hypertension: Secondary | ICD-10-CM | POA: Diagnosis not present

## 2019-06-04 DIAGNOSIS — Z1283 Encounter for screening for malignant neoplasm of skin: Secondary | ICD-10-CM | POA: Diagnosis not present

## 2019-06-04 DIAGNOSIS — I4819 Other persistent atrial fibrillation: Secondary | ICD-10-CM | POA: Diagnosis not present

## 2019-06-04 DIAGNOSIS — L570 Actinic keratosis: Secondary | ICD-10-CM | POA: Diagnosis not present

## 2019-06-04 DIAGNOSIS — L853 Xerosis cutis: Secondary | ICD-10-CM | POA: Diagnosis not present

## 2019-06-04 DIAGNOSIS — D692 Other nonthrombocytopenic purpura: Secondary | ICD-10-CM | POA: Diagnosis not present

## 2019-06-04 LAB — BASIC METABOLIC PANEL
Anion gap: 7 (ref 5–15)
BUN: 21 mg/dL (ref 8–23)
CO2: 28 mmol/L (ref 22–32)
Calcium: 8.7 mg/dL — ABNORMAL LOW (ref 8.9–10.3)
Chloride: 103 mmol/L (ref 98–111)
Creatinine, Ser: 1.03 mg/dL — ABNORMAL HIGH (ref 0.44–1.00)
GFR calc Af Amer: 57 mL/min — ABNORMAL LOW (ref >60)
GFR calc non Af Amer: 49 mL/min — ABNORMAL LOW (ref >60)
Glucose, Bld: 99 mg/dL (ref 70–99)
Potassium: 3.9 mmol/L (ref 3.5–5.1)
Sodium: 138 mmol/L (ref 135–145)

## 2019-06-07 ENCOUNTER — Telehealth: Payer: Self-pay

## 2019-06-07 ENCOUNTER — Ambulatory Visit
Admission: RE | Admit: 2019-06-07 | Discharge: 2019-06-07 | Disposition: A | Discharge status: Home / Self Care | Payer: Medicare Other | Source: Ambulatory Visit | Attending: Family Medicine | Admitting: Family Medicine

## 2019-06-07 DIAGNOSIS — Z780 Asymptomatic menopausal state: Secondary | ICD-10-CM | POA: Diagnosis not present

## 2019-06-07 DIAGNOSIS — Z1231 Encounter for screening mammogram for malignant neoplasm of breast: Secondary | ICD-10-CM | POA: Diagnosis not present

## 2019-06-07 DIAGNOSIS — M810 Age-related osteoporosis without current pathological fracture: Secondary | ICD-10-CM

## 2019-06-07 DIAGNOSIS — M8589 Other specified disorders of bone density and structure, multiple sites: Secondary | ICD-10-CM | POA: Diagnosis not present

## 2019-06-07 NOTE — Telephone Encounter (Signed)
Pt has NV tomorrow. Need to screen pt for covid symptoms.  LVM

## 2019-06-08 ENCOUNTER — Other Ambulatory Visit: Payer: Self-pay

## 2019-06-08 ENCOUNTER — Ambulatory Visit (INDEPENDENT_AMBULATORY_CARE_PROVIDER_SITE_OTHER): Payer: Medicare Other | Admitting: *Deleted

## 2019-06-08 ENCOUNTER — Encounter: Payer: Self-pay | Admitting: Family Medicine

## 2019-06-08 DIAGNOSIS — E538 Deficiency of other specified B group vitamins: Secondary | ICD-10-CM | POA: Diagnosis not present

## 2019-06-08 LAB — HM MAMMOGRAPHY

## 2019-06-08 MED ORDER — CYANOCOBALAMIN 1000 MCG/ML IJ SOLN
1000.0000 ug | Freq: Once | INTRAMUSCULAR | Status: AC
  Administered 2019-06-08: 15:00:00 1000 ug via INTRAMUSCULAR

## 2019-06-08 MED ORDER — POTASSIUM CHLORIDE 20 MEQ PO PACK: 20.0000 meq | PACK | Freq: Every day | ORAL | 11 refills | Status: DC

## 2019-06-08 NOTE — Telephone Encounter (Signed)
Pt came by today for her Vit b12 injection.   Pt states that she is having trouble swallowing her Potassium 34meq pills. Pt takes 1 BID. Pt states that she had one get stuck last night and she ended up throwing up.   Pt is wanting to know if there is another type - a coated tablet or a liquid - liquid is preferred.   Please advise, thanks.   [place], [place] [phone]  **requests to be emailed through here

## 2019-06-08 NOTE — Progress Notes (Signed)
Per orders of Dr. Gutierrez, injection of Vit B12 given by [name] Suthers M. Patient tolerated injection well.  

## 2019-06-09 ENCOUNTER — Encounter: Payer: Self-pay | Admitting: Family Medicine

## 2019-06-11 ENCOUNTER — Telehealth: Payer: Self-pay

## 2019-06-11 DIAGNOSIS — I10 Essential (primary) hypertension: Secondary | ICD-10-CM

## 2019-06-11 DIAGNOSIS — I4819 Other persistent atrial fibrillation: Secondary | ICD-10-CM

## 2019-06-11 NOTE — Telephone Encounter (Signed)
-----   Message from Wellington Hampshire, MD sent at 06/11/2019  8:15 AM EST ----- Slightly abnormal kidney function but not enough to change the dose of Lasix.  Continue 40 mg daily and repeat  basic metabolic profile in 3 weeks.

## 2019-06-11 NOTE — Telephone Encounter (Signed)
Patient made aware of lab results and Dr. Tyrell Antonio recommendation. Patient has a 07/02/19 appt with Noni Saupe., PA. Advised the patient that the repeat bmet can be drawn at that appt. Patient verbalized understanding and voiced appreciation for the call.

## 2019-06-14 ENCOUNTER — Other Ambulatory Visit: Payer: Self-pay | Admitting: Family Medicine

## 2019-06-14 NOTE — Telephone Encounter (Signed)
Electronic refill request Requip Last office visit 05/11/18 #180/3 Last office visit 05/05/19/

## 2019-06-15 ENCOUNTER — Ambulatory Visit: Payer: Medicare Other | Admitting: Cardiovascular Disease

## 2019-06-28 ENCOUNTER — Ambulatory Visit: Payer: Medicare Other | Admitting: Physician Assistant

## 2019-07-02 ENCOUNTER — Ambulatory Visit (INDEPENDENT_AMBULATORY_CARE_PROVIDER_SITE_OTHER): Payer: Medicare Other | Admitting: Physician Assistant

## 2019-07-02 ENCOUNTER — Other Ambulatory Visit
Admission: RE | Admit: 2019-07-02 | Discharge: 2019-07-02 | Disposition: A | Discharge status: Home / Self Care | Payer: Medicare Other | Source: Ambulatory Visit | Attending: Physician Assistant | Admitting: Physician Assistant

## 2019-07-02 ENCOUNTER — Encounter: Payer: Self-pay | Admitting: Physician Assistant

## 2019-07-02 ENCOUNTER — Other Ambulatory Visit: Payer: Self-pay

## 2019-07-02 VITALS — BP 138/64 | HR 69 | Ht 61.0 in | Wt 138.0 lb

## 2019-07-02 DIAGNOSIS — E785 Hyperlipidemia, unspecified: Secondary | ICD-10-CM

## 2019-07-02 DIAGNOSIS — I739 Peripheral vascular disease, unspecified: Secondary | ICD-10-CM | POA: Diagnosis not present

## 2019-07-02 DIAGNOSIS — R230 Cyanosis: Secondary | ICD-10-CM

## 2019-07-02 DIAGNOSIS — I5032 Chronic diastolic (congestive) heart failure: Secondary | ICD-10-CM | POA: Diagnosis not present

## 2019-07-02 DIAGNOSIS — I10 Essential (primary) hypertension: Secondary | ICD-10-CM | POA: Diagnosis not present

## 2019-07-02 DIAGNOSIS — I4892 Unspecified atrial flutter: Secondary | ICD-10-CM

## 2019-07-02 DIAGNOSIS — R0600 Dyspnea, unspecified: Secondary | ICD-10-CM | POA: Insufficient documentation

## 2019-07-02 DIAGNOSIS — R0609 Other forms of dyspnea: Secondary | ICD-10-CM

## 2019-07-02 DIAGNOSIS — R5383 Other fatigue: Secondary | ICD-10-CM

## 2019-07-02 DIAGNOSIS — R0602 Shortness of breath: Secondary | ICD-10-CM

## 2019-07-02 DIAGNOSIS — I2721 Secondary pulmonary arterial hypertension: Secondary | ICD-10-CM

## 2019-07-02 DIAGNOSIS — I4819 Other persistent atrial fibrillation: Secondary | ICD-10-CM | POA: Diagnosis not present

## 2019-07-02 DIAGNOSIS — R0989 Other specified symptoms and signs involving the circulatory and respiratory systems: Secondary | ICD-10-CM

## 2019-07-02 LAB — CBC WITH DIFFERENTIAL/PLATELET
Abs Immature Granulocytes: 0.02 10*3/uL (ref 0.00–0.07)
Basophils Absolute: 0.1 10*3/uL (ref 0.0–0.1)
Basophils Relative: 1 %
Eosinophils Absolute: 0.2 10*3/uL (ref 0.0–0.5)
Eosinophils Relative: 4 %
HCT: 37.6 % (ref 36.0–46.0)
Hemoglobin: 12.5 g/dL (ref 12.0–15.0)
Immature Granulocytes: 0 %
Lymphocytes Relative: 21 %
Lymphs Abs: 1.2 10*3/uL (ref 0.7–4.0)
MCH: 32.6 pg (ref 26.0–34.0)
MCHC: 33.2 g/dL (ref 30.0–36.0)
MCV: 97.9 fL (ref 80.0–100.0)
Monocytes Absolute: 0.6 10*3/uL (ref 0.1–1.0)
Monocytes Relative: 10 %
Neutro Abs: 3.7 10*3/uL (ref 1.7–7.7)
Neutrophils Relative %: 64 %
Platelets: 229 10*3/uL (ref 150–400)
RBC: 3.84 MIL/uL — ABNORMAL LOW (ref 3.87–5.11)
RDW: 13.2 % (ref 11.5–15.5)
WBC: 5.8 10*3/uL (ref 4.0–10.5)
nRBC: 0 % (ref 0.0–0.2)

## 2019-07-02 LAB — BASIC METABOLIC PANEL
Anion gap: 10 (ref 5–15)
BUN: 14 mg/dL (ref 8–23)
CO2: 26 mmol/L (ref 22–32)
Calcium: 8.8 mg/dL — ABNORMAL LOW (ref 8.9–10.3)
Chloride: 104 mmol/L (ref 98–111)
Creatinine, Ser: 1.04 mg/dL — ABNORMAL HIGH (ref 0.44–1.00)
GFR calc Af Amer: 56 mL/min — ABNORMAL LOW (ref >60)
GFR calc non Af Amer: 48 mL/min — ABNORMAL LOW (ref >60)
Glucose, Bld: 102 mg/dL — ABNORMAL HIGH (ref 70–99)
Potassium: 3.7 mmol/L (ref 3.5–5.1)
Sodium: 140 mmol/L (ref 135–145)

## 2019-07-02 MED ORDER — SODIUM CHLORIDE 0.9% FLUSH: 3.0000 mL | Freq: Two times a day (BID) | INTRAVENOUS | Status: DC

## 2019-07-02 NOTE — H&P (View-Only) (Signed)
Office Visit    Patient Name: [name] Date of Encounter: 07/02/2019  Primary Care Provider:  Ria Bush, MD Primary Cardiologist:  [name], MD  Chief Complaint    Chief Complaint  Patient presents with  . Other    4 week follow up. Patient c.o swelling in ankles. Meds reviewed verbally with patient.     86 year old female with history of persistent atrial flutter/  Fibrillation on reduced dose Eliquis 2.5 mg twice daily (see below regarding reduced dosing), HFpEF / G2DD (EF 60-65%), severe pulmonary hypertension (66.20mmHg), MR/TR/AR, essential hypertension, hyperlipidemia, PAD with recent angiography of tibial disease and underwent angioplasty on bilateral tibial vessels, chronic fatigue, and here for follow-up with report of bilateral lower extremity edema and discoloration.  Past Medical History    Past Medical History:  Diagnosis Date  . Abdominal aortic atherosclerosis ([place]) 09/2015   by xray  . Anxiety   . BCC (basal cell carcinoma of skin) 05/2011   on nose  . Breast lesion    a. diagnosed with complex sclerosing lesion with calcifications of the right breast in 08/2016  . Chest pain    a. nuclear stress test 03/2012: normal; b. 09/2018 MV: EF >65%, no isch/scar.   . DDD (degenerative disc disease), lumbosacral 12/06/1992  . Gallstones 09/2015   incidentally by xray  . GERD (gastroesophageal reflux disease) 05/1999  . HERPES ZOSTER 04/13/2007   Qualifier: Diagnosis of  By: [name], [name]   . Hiatal hernia   . History of shingles    ophthalmic, takes acyclovir daily  . Hyperlipidemia 12/2003  . Hypertension 1990  . Mitral regurgitation    a. echo 03/2012: EF 60-65%, no RWMA, mild to mod AI/MR, mod TR, PASP 37 mmHg  . Osteoarthritis 08/21/1989  . Osteoporosis with fracture 11/1997   with compression fracture T12  . Persistent atrial fibrillation ([place]) 03/16/2012   a. CHADS2VASc => 5 (HTN, age x 2, vascular disease, sex  category)-->Eliquis 5 BID;  b. Recurrent AF in 06/2016;  c. 01/2017 s/p DCCV;  d. 02/2017 recurrent AFib->Amio added->DCCV; e. 03/2017 recurrent AFib, s/p reload of amio and DCCV 04/02/2017  . Rheumatoid arthritis ([place])   . RLS (restless legs syndrome)    Past Surgical History:  Procedure Laterality Date  . ABDOMINAL HYSTERECTOMY  Age 20 - 35   S/P TAH  . abdominal ultrasound  04/23/2004   Gallstones  . BREAST EXCISIONAL BIOPSY Right 2018   neg/benign complex sclerosing lesion  . BREAST LUMPECTOMY WITH RADIOACTIVE SEED LOCALIZATION Right 10/2016   breast lumpectomy with radioactive seed localization and margin assessment for complex sclerosing lesion [facility])  . BREAST LUMPECTOMY WITH RADIOACTIVE SEED LOCALIZATION Right 11/05/2016   Procedure: RIGHT BREAST LUMPECTOMY WITH RADIOACTIVE SEED LOCALIZATION;  Surgeon: Fanny Skates, MD;  Location: [facility];  Service: General;  Laterality: Right;  . CARDIOVERSION N/A 01/31/2017   Procedure: CARDIOVERSION;  Surgeon: Wellington Hampshire, MD;  Location: [facility];  Service: Cardiovascular;  Laterality: N/A;  . CARDIOVERSION N/A 03/03/2017   Procedure: CARDIOVERSION;  Surgeon: Wellington Hampshire, MD;  Location: [place] [facility];  Service: Cardiovascular;  Laterality: N/A;  . CARDIOVERSION N/A 04/02/2017   Procedure: CARDIOVERSION;  Surgeon: Wellington Hampshire, MD;  Location: [facility];  Service: Cardiovascular;  Laterality: N/A;  . CARDIOVERSION N/A 01/16/2018   Procedure: CARDIOVERSION;  Surgeon: Wellington Hampshire, MD;  Location: [facility];  Service: Cardiovascular;  Laterality: N/A;  . CATARACT EXTRACTION  05/2010   left  eye  . CERVICAL DISCECTOMY  1992   Fusion (Dr. Joya Salm)  . ESI Bilateral 01/2016   S1 transforaminal ESI x3  . ESOPHAGOGASTRODUODENOSCOPY  01/01/2002   with ulcer, bx. neg; + stricture gastric ulcer with hemorrhage  . ESOPHAGOGASTRODUODENOSCOPY  04/23/2004   Esophageal stricture -- dilated  . LOWER EXTREMITY ANGIOGRAPHY  Left 02/01/2019   LOWER EXTREMITY ANGIOGRAPHY;  Surgeon: Algernon Huxley, MD  . LOWER EXTREMITY ANGIOGRAPHY Right 03/25/2019   Procedure: LOWER EXTREMITY ANGIOGRAPHY;  Surgeon: Algernon Huxley, MD;  Location: [place] [facility];  Service: Cardiovascular;  Laterality: Right;  . nuclear stress test  03/2012   no ischemia  . PERIPHERAL VASCULAR BALLOON ANGIOPLASTY Left 01/2019   Percutaneous transluminal angioplasty of left anterior and posterior tibial artery with 2.5 mm diameter by 22 cm length angioplasty balloon ([place])  . SKIN CANCER EXCISION  05/20/2011   BCC from nose  . US ECHOCARDIOGRAPHY  03/2012   in flutter, EF 60%, mild-mod aortic, mitral, tricuspid regurg, mildly dilated LA    Allergies  Allergies  Allergen Reactions  . Penicillins Rash and Other (See Comments)    Childhood Allergy Has patient had a PCN reaction causing immediate rash, facial/tongue/throat swelling, SOB or lightheadedness with hypotension: Yes Has patient had a PCN reaction causing severe rash involving mucus membranes or skin necrosis: Unknown Has patient had a PCN reaction that required hospitalization: No Has patient had a PCN reaction occurring within the last 10 years: No If all of the above answers are "NO", then may proceed with Cephalosporin use.     History of Present Illness    [name] is a 86 y.o. female with PMH as above.  She has a history of hearing loss and often has visits facilitated with the help of her Gast at the time of visit.  She has a known history of persistent atrial fibrillation and flutter maintaining sinus rhythm with amiodarone, chronic dyspnea and fatigue, essential hypertension, hyperlipidemia, PAD, and rheumatoid arthritis.  Echo in 2019 showed normal LVSF, mild aortic and mitral regurgitation, mildly dilated left atrium, and moderate pulmonary hypertension with systolic pressure of 63 mmHg.  She had been on small dose Lasix for chronic diastolic heart failure and moderate  pulmonary hypertension.  She had a Lexiscan in June 2020 that showed normal LVSF without ischemia or scar.  She had bilateral foot discomfort felt to be multifactorial due to neuropathy and leg swelling.  She was found to have tibial disease and underwent angioplasty on bilateral tibial vessels by Dr. Lucky Cowboy.  At her last visit, she reported improvement in symptoms.  She had recently been started on small dose lisinopril due to elevated blood pressure.  Given her recent exertional dyspnea and fatigue, echo was repeated and showed EF 60 to 123456, grade 2 diastolic dysfunction, moderate mitral regurgitation/tricuspid regurgitation, mild to moderate aortic regurgitation, and severe pulmonary hypertension with estimated pulmonary pressure between 60 and 65 mmHg.  Recommendations were to decrease amiodarone to 100 mg daily, increase lisinopril to 10 mg for elevated blood pressure, and increase to Lasix 40 mg daily due to her continued exertional dyspnea with moderate pulmonary hypertension.  She was continued on reduced dose Eliquis 2.5 mg twice daily.   Today, she returns to clinic and is accompanied by her daughter.  Both report concern regarding her bilateral lower extremities.  They are unable to specify a timeline; however, they report that her feet continue to remain discolored with both feet blue and dark purple on  exam today.  They recently went to a podiatrist to have a procedure done on her right foot (shaved off some of the bone of the R foot), which is now not healing. Unable to find this procedure documentation on EMR; however, ulcer is still apparent on her foot today.  In addition, her left ankle remains swollen with previous diagnosis of left ankle cellulitis on review of chart.  She reports that, following her recent revascularization by [facility], she initially had pink feet and they were warm at night.  She is uncertain at what point they started to change color or become cold.  She reports difficulty with  ambulation given numbness and weakness in both ankles and feet.  In addition, she reports pain in both feet when left in the dependent position and improving when she puts her feet up. She feels significant pain related to her nonhealing sore.  On exam today, she is frequently raising her feet due to discomfort.  She states that her feet will lighten up to a dark red and lighter purple when she raises them, which is the reason that she feels this better.  She notes occasional tingling.  She states today that she has not seen [facility] since her revascularization in December 2020.  In addition, she denies improvement in her dyspnea and fatigue since her previous visit with her primary cardiologist.  Her daughter notes today that the patient spends the majority of her time seated, as she becomes significantly short of breath when she attempts to stand.  She is often fatigued. The patient denies any orthopnea, abdominal distention, PND, or early satiety.  She does not wear self at home; however, she does not believe that she has been gaining weight.  She does not feel any racing heart rate or palpitations.  She denies chest pain. No recent presyncope or syncope. No recent mechanical falls.  She denies any signs or symptoms consistent with bleeding.  Her main concern today is for ongoing fatigue, dyspnea, and bilateral lower extremity problems with her feet.  She reports medication compliance. She is not very active due to her lower extremity discomfort and dyspnea, as well as her fatigue.   Home Medications    Prior to Admission medications   Medication Sig Start Date End Date Taking? Authorizing Provider  acetaminophen (TYLENOL) 500 MG tablet Take 1,000 mg every 8 (eight) hours as needed by mouth for moderate pain.    Yes [provider]  acyclovir (ZOVIRAX) 400 MG tablet Take 400 mg 2 (two) times daily by mouth.    Yes [provider]  amiodarone (PACERONE) 100 MG tablet Take 1 tablet (100 mg  total) by mouth daily. 05/28/19  Yes Wellington Hampshire, MD  aspirin EC 81 MG tablet Take 1 tablet (81 mg total) by mouth daily. 02/01/19  Yes Algernon Huxley, MD  atorvastatin (LIPITOR) 20 MG tablet TAKE ONE TABLET BY MOUTH EVERY DAY 07/10/18  Yes Ria Bush, MD  DULoxetine (CYMBALTA) 60 MG capsule TAKE 1 CAPSULE BY MOUTH EVERY DAY 11/12/18  Yes Ria Bush, MD  ELIQUIS 2.5 MG TABS tablet TAKE ONE TABLET BY MOUTH TWICE DAILY 01/13/19  Yes Wellington Hampshire, MD  ergocalciferol (VITAMIN D2) 1.25 MG (50000 UT) capsule Take 1 capsule (50,000 Units total) by mouth once a week. 05/17/19  Yes Ria Bush, MD  furosemide (LASIX) 40 MG tablet Take 1 tablet (40 mg total) by mouth daily. 05/28/19  Yes Wellington Hampshire, MD  gabapentin (NEURONTIN) 300 MG  capsule Take 1 capsule (300 mg total) by mouth at bedtime. 05/05/19  Yes Ria Bush, MD  lisinopril (ZESTRIL) 10 MG tablet Take 1 tablet (10 mg total) by mouth daily. 05/28/19  Yes Wellington Hampshire, MD  metoprolol tartrate (LOPRESSOR) 25 MG tablet Take 0.5 tablets (12.5 mg total) by mouth 2 (two) times daily. 08/13/18  Yes Wellington Hampshire, MD  pantoprazole (PROTONIX) 40 MG tablet Take 1 tablet (40 mg total) by mouth daily. 04/30/19  Yes Ria Bush, MD  potassium chloride (KLOR-CON) 20 MEQ packet Take 20 mEq by mouth daily. 06/08/19  Yes Ria Bush, MD  prednisoLONE acetate (PRED FORTE) 1 % ophthalmic suspension Place 1 drop into the left eye daily.    Yes [provider]  rOPINIRole (REQUIP) 1 MG tablet TAKE 2 TABLETS BY MOUTH AT BEDTIME 06/14/19  Yes Ria Bush, MD  tiZANidine (ZANAFLEX) 2 MG tablet Take 2 mg by mouth every evening. Takes 1 tablet daily 12/04/17  Yes Ria Bush, MD  traMADol (ULTRAM) 50 MG tablet Take 50 mg by mouth daily.  12/04/17  Yes Ria Bush, MD    Review of Systems    She denies chest pain, palpitations, pnd, orthopnea, n, v, dizziness, syncope, weight gain, or early satiety.  She  reports dyspnea, unsteady gait, and bilateral lower extremity/pedal discoloration & coolness with decreased sensation.  She has a nonhealing ulcer on her right foot.  She reports fatigue. She also notes intermittent lower extremity edema with LLEE>RLEE on exam today- all other systems reviewed and are otherwise negative except as noted above.  Physical Exam    VS:  BP 138/64 (BP Location: Right Arm, Patient Position: Sitting, Cuff Size: Normal)   Pulse 69   Ht 5\' 1"  (1.549 m)   Wt 138 lb (62.6 kg)   SpO2 96%   BMI 26.07 kg/m  , BMI There is no height or weight on file to calculate BMI. GEN: Well nourished, well developed, in no acute distress. HEENT: normal. Neck: Supple, no JVD, carotid bruits, or masses. Cardiac: RRR, 3/6 systolic murmur best heard in the RUSB, rubs, or gallops. No clubbing, cyanosis, left ankle and pedal edema.  Radials 2+ and equal bilaterally.  Unable to assess bilateral pedal pulses and used Doppler to obtain and confirm flow. Respiratory:  Respirations regular and unlabored, clear to auscultation bilaterally. GI: Soft, nontender, nondistended, BS + x 4. MS: no deformity or atrophy. See below regarding skin changes. Skin: warm and dry, no rash.  Bilateral pedal pulses diminished with cool extremities and mottling with significant discoloration-black/dark purple feet.  She reports sensation intact during exam. As above, flow confirmed with doppler as unable to assess pulses on exam. R foot ulcer confirmed as nonhealing on foot. Neuro:  Strength and sensation are intact. Psych: Normal affect.  Accessory Clinical Findings    ECG personally reviewed by me today -NSR, 69 bpm, consider anterior/ inferior ischemia with Q waves in inferior leads and TWI in anterior leads, poor R wave progression in precordial leads, IVCD with QRS 90 ms, QTC 42- no acute changes.  VITALS Reviewed today   Temp Readings from Last 3 Encounters:  05/05/19 97.6 F (36.4 C) (Temporal)  03/25/19  98.4 F (36.9 C) (Oral)  02/01/19 98.3 F (36.8 C) (Oral)   BP Readings from Last 3 Encounters:  07/02/19 138/64  05/28/19 (!) 150/84  05/14/19 (!) 152/70   Pulse Readings from Last 3 Encounters:  07/02/19 69  05/28/19 60  05/14/19 64  Wt Readings from Last 3 Encounters:  07/02/19 138 lb (62.6 kg)  05/28/19 140 lb (63.5 kg)  05/14/19 139 lb 5 oz (63.2 kg)     LABS  reviewed today   [place] present? Yes/No: No  Lab Results  Component Value Date   WBC 6.1 04/28/2019   HGB 13.0 04/28/2019   HCT 38.9 04/28/2019   MCV 99.2 04/28/2019   PLT 252.0 04/28/2019   Lab Results  Component Value Date   CREATININE 1.03 (H) 06/04/2019   BUN 21 06/04/2019   NA 138 06/04/2019   K 3.9 06/04/2019   CL 103 06/04/2019   CO2 28 06/04/2019   Lab Results  Component Value Date   ALT 19 04/28/2019   AST 19 04/28/2019   ALKPHOS 58 04/28/2019   BILITOT 0.6 04/28/2019   Lab Results  Component Value Date   CHOL 134 04/28/2019   HDL 67.20 04/28/2019   LDLCALC 50 04/28/2019   LDLDIRECT 173.1 02/19/2010   TRIG 85.0 04/28/2019   CHOLHDL 2 04/28/2019    No results found for: HGBA1C Lab Results  Component Value Date   TSH 2.30 08/03/2018     STUDIES/PROCEDURES reviewed today   Echo 05/2019 1. Left ventricular ejection fraction, by estimation, is 60 to 65%. The  left ventricle has normal function. The left ventricle has no regional  wall motion abnormalities. There is mild left ventricular hypertrophy.  Left ventricular diastolic parameters  are consistent with Grade II diastolic dysfunction (pseudonormalization).  2. Right ventricular systolic function is normal. The right ventricular  size is mildly enlarged. mildly increased right ventricular wall  thickness. There is severely elevated pulmonary artery systolic pressure.  3. Left atrial size was mild to moderately dilated.  4. Right atrial size was mildly dilated.  5. Moderate mitral valve regurgitation.   6. Tricuspid valve regurgitation is moderate.  7. Aortic valve regurgitation is mild to moderate.  8. The inferior vena cava is normal in size with <50% respiratory  variability, suggesting right atrial pressure of 8 mmHg.   ABI 04/2019 Summary:  Right: Resting right ankle-brachial index is within normal range. No  evidence of significant right lower extremity arterial disease. The right  toe-brachial index is abnormal.  Left: Resting left ankle-brachial index is within normal range. No  evidence of significant left lower extremity arterial disease. The left  toe-brachial index is abnormal.   NM Study 09/2018  Normal pharmacologic myocardial perfusion stress test without significant ischemia or scar.  The left ventricular ejection fraction is normal (>65%).  This is a low risk study.  Attenuation correction CT is notable for coronary artery and aortic atherosclerotic calcification, as well as aortic valve and mitral annular calcification.  A moderate-sided hiatal hernia is present.  Hyperdensity of the liver is noted, which can be seen with long-term amiodarone use. Clinical/laboratory correlation recommended.  Splenic calcifications suggest prior granulomatous disease.  Assessment & Plan    Chronic diastolic heart failure (EF 60-65%) Chronic dyspnea on exertion Severe Pulmonary HTN (66.34mmHg) Valvular Dz: Moderate MR/TR and mild to moderate AR --Continues to have dyspnea that remains unchanged from her previous visit.  Unclear etiology of dyspnea with multiple comorbid conditions that could be contributing to her symptoms.  In addition to diastolic heart failure with severely elevated pulmonary pressures, she also has valvular disease as outlined above in most recent echo and a history of A. fib/a flutter.  Updated echo shows grade 2 diastolic dysfunction with severe pulmonary hypertension.  Lasix was increased  at her previous visit after reviewing results of echo to Lasix  40 mg daily with patient reporting improved LEE but unchanged dyspnea.  Euvolemic on exam today s/p increase of Lasix with LLEE though history of L ankle cellulitis and does appear this may be the reason for the L ankle edema. It was noted previously by her primary cardiologist that, if no improvement in her symptoms at follow-up, she should proceed with left and right cardiac catheterization.  Therefore, we will schedule her for [facility] at this time. Risks and benefits of cardiac catheterization have been discussed with the patient.  These include bleeding, infection, kidney damage, stroke, heart attack, death.  The patient understands these risks and is willing to proceed. Check BMET and CBC given her recent contrast exposure and as on Eliquis 2.5mg  BID, which she will hold 2 days before her catheterization. Continue current medications. If no cardiac explanation for dyspnea, referral to pulmonary for PFTs should be considered.    Essential hypertension --Blood pressure improved from previous visits yet still somewhat suboptimal at 138/64.  Lisinopril was increased to 10 mg daily at her last visit in addition to her increased Lasix.  If blood pressure remains elevated at return to clinic, recommend increasing further to lisinopril 20 mg daily.  Will defer for now.  HLD --Continue current statin.  Most recent LDL 50 and at goal.  Paroxysmal atrial flutter and persistent atrial fibrillation --Maintaining NSR.  She is taking amiodarone 100 mg daily and metoprolol 12.5 mg twice daily.  Continue low-dose Eliquis 2.5 mg daily.  She is borderline for ongoing reduced dose Eliquis as she currently only meets age criteria of 86 years old and is slightly above weight cutoff at 62.6 kg today, and we will reassess dosing s/p cath.  If she continues to report dyspnea after [facility], and if cardiac catheterization offers no cardiac explanation, consider referring to pulmonology for PFTs at that time.  Peripheral arterial  disease with cyanosis / cool extremities / pedal pulses undetected but confirmed by echo; nonhealing R foot wound --S/p tibial vessel intervention and managed by Dr. Lucky Cowboy.  Pedal pulses not able to be felt on today's exam and found via Doppler with recommendation that she follow up with [facility] in the office as soon as possible.  She has a right foot wound that is not healing.  Most recent ABIs as above.  Continue ASA and statin.   Medication changes: Hold Eliquis 2 days before her right and left heart cardiac catheterization.  Reassess reduced dose Eliquis after cardiac catheterization, as she no longer meets criteria for reduced dosing / borderline for criteria.  If BP is still elevated at RTC, increase lisinopril. Labs ordered: BMET, CBC. Studies / Imaging ordered: R/LHC. Referral to [facility] for decision regarding lower extremity angiography. Future considerations: Referral to pulmonology for PFTs.  After her cardiac catheterization, reassess dosing of Eliquis.  She is borderline weight for reduced dosing.  If BP remains elevated at follow-up, increase lisinopril. Disposition: Follow-up after her cardiac catheterization.  Total time spent with patient today 45 minutes. This includes reviewing records, evaluating the patient, and coordinating care. Face-to-face time >50%.    Arvil Chaco, PA-C 07/02/2019

## 2019-07-02 NOTE — Progress Notes (Signed)
Office Visit    Patient Name: [name] Date of Encounter: 07/02/2019  Primary Care Provider:  Ria Bush, MD Primary Cardiologist:  [name], MD  Chief Complaint    Chief Complaint  Patient presents with  . Other    4 week follow up. Patient c.o swelling in ankles. Meds reviewed verbally with patient.     86 year old female with history of persistent atrial flutter/  Fibrillation on reduced dose Eliquis 2.5 mg twice daily (see below regarding reduced dosing), HFpEF / G2DD (EF 60-65%), severe pulmonary hypertension (66.36mmHg), MR/TR/AR, essential hypertension, hyperlipidemia, PAD with recent angiography of tibial disease and underwent angioplasty on bilateral tibial vessels, chronic fatigue, and here for follow-up with report of bilateral lower extremity edema and discoloration.  Past Medical History    Past Medical History:  Diagnosis Date  . Abdominal aortic atherosclerosis ([place]) 09/2015   by xray  . Anxiety   . BCC (basal cell carcinoma of skin) 05/2011   on nose  . Breast lesion    a. diagnosed with complex sclerosing lesion with calcifications of the right breast in 08/2016  . Chest pain    a. nuclear stress test 03/2012: normal; b. 09/2018 MV: EF >65%, no isch/scar.   . DDD (degenerative disc disease), lumbosacral 12/06/1992  . Gallstones 09/2015   incidentally by xray  . GERD (gastroesophageal reflux disease) 05/1999  . HERPES ZOSTER 04/13/2007   Qualifier: Diagnosis of  By: [name], [name]   . Hiatal hernia   . History of shingles    ophthalmic, takes acyclovir daily  . Hyperlipidemia 12/2003  . Hypertension 1990  . Mitral regurgitation    a. echo 03/2012: EF 60-65%, no RWMA, mild to mod AI/MR, mod TR, PASP 37 mmHg  . Osteoarthritis 08/21/1989  . Osteoporosis with fracture 11/1997   with compression fracture T12  . Persistent atrial fibrillation ([place]) 03/16/2012   a. CHADS2VASc => 5 (HTN, age x 2, vascular disease, sex  category)-->Eliquis 5 BID;  b. Recurrent AF in 06/2016;  c. 01/2017 s/p DCCV;  d. 02/2017 recurrent AFib->Amio added->DCCV; e. 03/2017 recurrent AFib, s/p reload of amio and DCCV 04/02/2017  . Rheumatoid arthritis ([place])   . RLS (restless legs syndrome)    Past Surgical History:  Procedure Laterality Date  . ABDOMINAL HYSTERECTOMY  Age 42 - 62   S/P TAH  . abdominal ultrasound  04/23/2004   Gallstones  . BREAST EXCISIONAL BIOPSY Right 2018   neg/benign complex sclerosing lesion  . BREAST LUMPECTOMY WITH RADIOACTIVE SEED LOCALIZATION Right 10/2016   breast lumpectomy with radioactive seed localization and margin assessment for complex sclerosing lesion [facility])  . BREAST LUMPECTOMY WITH RADIOACTIVE SEED LOCALIZATION Right 11/05/2016   Procedure: RIGHT BREAST LUMPECTOMY WITH RADIOACTIVE SEED LOCALIZATION;  Surgeon: Fanny Skates, MD;  Location: [place];  Service: General;  Laterality: Right;  . CARDIOVERSION N/A 01/31/2017   Procedure: CARDIOVERSION;  Surgeon: Wellington Hampshire, MD;  Location: [facility];  Service: Cardiovascular;  Laterality: N/A;  . CARDIOVERSION N/A 03/03/2017   Procedure: CARDIOVERSION;  Surgeon: Wellington Hampshire, MD;  Location: [facility];  Service: Cardiovascular;  Laterality: N/A;  . CARDIOVERSION N/A 04/02/2017   Procedure: CARDIOVERSION;  Surgeon: Wellington Hampshire, MD;  Location: [facility];  Service: Cardiovascular;  Laterality: N/A;  . CARDIOVERSION N/A 01/16/2018   Procedure: CARDIOVERSION;  Surgeon: Wellington Hampshire, MD;  Location: [facility];  Service: Cardiovascular;  Laterality: N/A;  . CATARACT EXTRACTION  05/2010   left  eye  . CERVICAL DISCECTOMY  1992   Fusion (Dr. Joya Salm)  . ESI Bilateral 01/2016   S1 transforaminal ESI x3  . ESOPHAGOGASTRODUODENOSCOPY  01/01/2002   with ulcer, bx. neg; + stricture gastric ulcer with hemorrhage  . ESOPHAGOGASTRODUODENOSCOPY  04/23/2004   Esophageal stricture -- dilated  . LOWER EXTREMITY ANGIOGRAPHY  Left 02/01/2019   LOWER EXTREMITY ANGIOGRAPHY;  Surgeon: Algernon Huxley, MD  . LOWER EXTREMITY ANGIOGRAPHY Right 03/25/2019   Procedure: LOWER EXTREMITY ANGIOGRAPHY;  Surgeon: Algernon Huxley, MD;  Location: [facility];  Service: Cardiovascular;  Laterality: Right;  . nuclear stress test  03/2012   no ischemia  . PERIPHERAL VASCULAR BALLOON ANGIOPLASTY Left 01/2019   Percutaneous transluminal angioplasty of left anterior and posterior tibial artery with 2.5 mm diameter by 22 cm length angioplasty balloon ([place])  . SKIN CANCER EXCISION  05/20/2011   BCC from nose  . US ECHOCARDIOGRAPHY  03/2012   in flutter, EF 60%, mild-mod aortic, mitral, tricuspid regurg, mildly dilated LA    Allergies  Allergies  Allergen Reactions  . Penicillins Rash and Other (See Comments)    Childhood Allergy Has patient had a PCN reaction causing immediate rash, facial/tongue/throat swelling, SOB or lightheadedness with hypotension: Yes Has patient had a PCN reaction causing severe rash involving mucus membranes or skin necrosis: Unknown Has patient had a PCN reaction that required hospitalization: No Has patient had a PCN reaction occurring within the last 10 years: No If all of the above answers are "NO", then may proceed with Cephalosporin use.     History of Present Illness    [name] is a 86 y.o. female with PMH as above.  She has a history of hearing loss and often has visits facilitated with the help of her Gast at the time of visit.  She has a known history of persistent atrial fibrillation and flutter maintaining sinus rhythm with amiodarone, chronic dyspnea and fatigue, essential hypertension, hyperlipidemia, PAD, and rheumatoid arthritis.  Echo in 2019 showed normal LVSF, mild aortic and mitral regurgitation, mildly dilated left atrium, and moderate pulmonary hypertension with systolic pressure of 63 mmHg.  She had been on small dose Lasix for chronic diastolic heart failure and moderate  pulmonary hypertension.  She had a Lexiscan in June 2020 that showed normal LVSF without ischemia or scar.  She had bilateral foot discomfort felt to be multifactorial due to neuropathy and leg swelling.  She was found to have tibial disease and underwent angioplasty on bilateral tibial vessels by Dr. Lucky Cowboy.  At her last visit, she reported improvement in symptoms.  She had recently been started on small dose lisinopril due to elevated blood pressure.  Given her recent exertional dyspnea and fatigue, echo was repeated and showed EF 60 to 123456, grade 2 diastolic dysfunction, moderate mitral regurgitation/tricuspid regurgitation, mild to moderate aortic regurgitation, and severe pulmonary hypertension with estimated pulmonary pressure between 60 and 65 mmHg.  Recommendations were to decrease amiodarone to 100 mg daily, increase lisinopril to 10 mg for elevated blood pressure, and increase to Lasix 40 mg daily due to her continued exertional dyspnea with moderate pulmonary hypertension.  She was continued on reduced dose Eliquis 2.5 mg twice daily.   Today, she returns to clinic and is accompanied by her daughter.  Both report concern regarding her bilateral lower extremities.  They are unable to specify a timeline; however, they report that her feet continue to remain discolored with both feet blue and dark purple on  exam today.  They recently went to a podiatrist to have a procedure done on her right foot (shaved off some of the bone of the R foot), which is now not healing. Unable to find this procedure documentation on EMR; however, ulcer is still apparent on her foot today.  In addition, her left ankle remains swollen with previous diagnosis of left ankle cellulitis on review of chart.  She reports that, following her recent revascularization by [facility], she initially had pink feet and they were warm at night.  She is uncertain at what point they started to change color or become cold.  She reports difficulty with  ambulation given numbness and weakness in both ankles and feet.  In addition, she reports pain in both feet when left in the dependent position and improving when she puts her feet up. She feels significant pain related to her nonhealing sore.  On exam today, she is frequently raising her feet due to discomfort.  She states that her feet will lighten up to a dark red and lighter purple when she raises them, which is the reason that she feels this better.  She notes occasional tingling.  She states today that she has not seen [facility] since her revascularization in December 2020.  In addition, she denies improvement in her dyspnea and fatigue since her previous visit with her primary cardiologist.  Her daughter notes today that the patient spends the majority of her time seated, as she becomes significantly short of breath when she attempts to stand.  She is often fatigued. The patient denies any orthopnea, abdominal distention, PND, or early satiety.  She does not wear self at home; however, she does not believe that she has been gaining weight.  She does not feel any racing heart rate or palpitations.  She denies chest pain. No recent presyncope or syncope. No recent mechanical falls.  She denies any signs or symptoms consistent with bleeding.  Her main concern today is for ongoing fatigue, dyspnea, and bilateral lower extremity problems with her feet.  She reports medication compliance. She is not very active due to her lower extremity discomfort and dyspnea, as well as her fatigue.   Home Medications    Prior to Admission medications   Medication Sig Start Date End Date Taking? Authorizing Provider  acetaminophen (TYLENOL) 500 MG tablet Take 1,000 mg every 8 (eight) hours as needed by mouth for moderate pain.    Yes [provider]  acyclovir (ZOVIRAX) 400 MG tablet Take 400 mg 2 (two) times daily by mouth.    Yes [provider]  amiodarone (PACERONE) 100 MG tablet Take 1 tablet (100 mg  total) by mouth daily. 05/28/19  Yes Wellington Hampshire, MD  aspirin EC 81 MG tablet Take 1 tablet (81 mg total) by mouth daily. 02/01/19  Yes Algernon Huxley, MD  atorvastatin (LIPITOR) 20 MG tablet TAKE ONE TABLET BY MOUTH EVERY DAY 07/10/18  Yes Ria Bush, MD  DULoxetine (CYMBALTA) 60 MG capsule TAKE 1 CAPSULE BY MOUTH EVERY DAY 11/12/18  Yes Ria Bush, MD  ELIQUIS 2.5 MG TABS tablet TAKE ONE TABLET BY MOUTH TWICE DAILY 01/13/19  Yes Wellington Hampshire, MD  ergocalciferol (VITAMIN D2) 1.25 MG (50000 UT) capsule Take 1 capsule (50,000 Units total) by mouth once a week. 05/17/19  Yes Ria Bush, MD  furosemide (LASIX) 40 MG tablet Take 1 tablet (40 mg total) by mouth daily. 05/28/19  Yes Wellington Hampshire, MD  gabapentin (NEURONTIN) 300 MG  capsule Take 1 capsule (300 mg total) by mouth at bedtime. 05/05/19  Yes Ria Bush, MD  lisinopril (ZESTRIL) 10 MG tablet Take 1 tablet (10 mg total) by mouth daily. 05/28/19  Yes Wellington Hampshire, MD  metoprolol tartrate (LOPRESSOR) 25 MG tablet Take 0.5 tablets (12.5 mg total) by mouth 2 (two) times daily. 08/13/18  Yes Wellington Hampshire, MD  pantoprazole (PROTONIX) 40 MG tablet Take 1 tablet (40 mg total) by mouth daily. 04/30/19  Yes Ria Bush, MD  potassium chloride (KLOR-CON) 20 MEQ packet Take 20 mEq by mouth daily. 06/08/19  Yes Ria Bush, MD  prednisoLONE acetate (PRED FORTE) 1 % ophthalmic suspension Place 1 drop into the left eye daily.    Yes [provider]  rOPINIRole (REQUIP) 1 MG tablet TAKE 2 TABLETS BY MOUTH AT BEDTIME 06/14/19  Yes Ria Bush, MD  tiZANidine (ZANAFLEX) 2 MG tablet Take 2 mg by mouth every evening. Takes 1 tablet daily 12/04/17  Yes Ria Bush, MD  traMADol (ULTRAM) 50 MG tablet Take 50 mg by mouth daily.  12/04/17  Yes Ria Bush, MD    Review of Systems    She denies chest pain, palpitations, pnd, orthopnea, n, v, dizziness, syncope, weight gain, or early satiety.  She  reports dyspnea, unsteady gait, and bilateral lower extremity/pedal discoloration & coolness with decreased sensation.  She has a nonhealing ulcer on her right foot.  She reports fatigue. She also notes intermittent lower extremity edema with LLEE>RLEE on exam today- all other systems reviewed and are otherwise negative except as noted above.  Physical Exam    VS:  BP 138/64 (BP Location: Right Arm, Patient Position: Sitting, Cuff Size: Normal)   Pulse 69   Ht 5\' 1"  (1.549 m)   Wt 138 lb (62.6 kg)   SpO2 96%   BMI 26.07 kg/m  , BMI There is no height or weight on file to calculate BMI. GEN: Well nourished, well developed, in no acute distress. HEENT: normal. Neck: Supple, no JVD, carotid bruits, or masses. Cardiac: RRR, 3/6 systolic murmur best heard in the RUSB, rubs, or gallops. No clubbing, cyanosis, left ankle and pedal edema.  Radials 2+ and equal bilaterally.  Unable to assess bilateral pedal pulses and used Doppler to obtain and confirm flow. Respiratory:  Respirations regular and unlabored, clear to auscultation bilaterally. GI: Soft, nontender, nondistended, BS + x 4. MS: no deformity or atrophy. See below regarding skin changes. Skin: warm and dry, no rash.  Bilateral pedal pulses diminished with cool extremities and mottling with significant discoloration-black/dark purple feet.  She reports sensation intact during exam. As above, flow confirmed with doppler as unable to assess pulses on exam. R foot ulcer confirmed as nonhealing on foot. Neuro:  Strength and sensation are intact. Psych: Normal affect.  Accessory Clinical Findings    ECG personally reviewed by me today -NSR, 69 bpm, consider anterior/ inferior ischemia with Q waves in inferior leads and TWI in anterior leads, poor R wave progression in precordial leads, IVCD with QRS 90 ms, QTC 42- no acute changes.  VITALS Reviewed today   Temp Readings from Last 3 Encounters:  05/05/19 97.6 F (36.4 C) (Temporal)  03/25/19  98.4 F (36.9 C) (Oral)  02/01/19 98.3 F (36.8 C) (Oral)   BP Readings from Last 3 Encounters:  07/02/19 138/64  05/28/19 (!) 150/84  05/14/19 (!) 152/70   Pulse Readings from Last 3 Encounters:  07/02/19 69  05/28/19 60  05/14/19 64  Wt Readings from Last 3 Encounters:  07/02/19 138 lb (62.6 kg)  05/28/19 140 lb (63.5 kg)  05/14/19 139 lb 5 oz (63.2 kg)     LABS  reviewed today   [place] present? Yes/No: No  Lab Results  Component Value Date   WBC 6.1 04/28/2019   HGB 13.0 04/28/2019   HCT 38.9 04/28/2019   MCV 99.2 04/28/2019   PLT 252.0 04/28/2019   Lab Results  Component Value Date   CREATININE 1.03 (H) 06/04/2019   BUN 21 06/04/2019   NA 138 06/04/2019   K 3.9 06/04/2019   CL 103 06/04/2019   CO2 28 06/04/2019   Lab Results  Component Value Date   ALT 19 04/28/2019   AST 19 04/28/2019   ALKPHOS 58 04/28/2019   BILITOT 0.6 04/28/2019   Lab Results  Component Value Date   CHOL 134 04/28/2019   HDL 67.20 04/28/2019   LDLCALC 50 04/28/2019   LDLDIRECT 173.1 02/19/2010   TRIG 85.0 04/28/2019   CHOLHDL 2 04/28/2019    No results found for: HGBA1C Lab Results  Component Value Date   TSH 2.30 08/03/2018     STUDIES/PROCEDURES reviewed today   Echo 05/2019 1. Left ventricular ejection fraction, by estimation, is 60 to 65%. The  left ventricle has normal function. The left ventricle has no regional  wall motion abnormalities. There is mild left ventricular hypertrophy.  Left ventricular diastolic parameters  are consistent with Grade II diastolic dysfunction (pseudonormalization).  2. Right ventricular systolic function is normal. The right ventricular  size is mildly enlarged. mildly increased right ventricular wall  thickness. There is severely elevated pulmonary artery systolic pressure.  3. Left atrial size was mild to moderately dilated.  4. Right atrial size was mildly dilated.  5. Moderate mitral valve regurgitation.    6. Tricuspid valve regurgitation is moderate.  7. Aortic valve regurgitation is mild to moderate.  8. The inferior vena cava is normal in size with <50% respiratory  variability, suggesting right atrial pressure of 8 mmHg.   ABI 04/2019 Summary:  Right: Resting right ankle-brachial index is within normal range. No  evidence of significant right lower extremity arterial disease. The right  toe-brachial index is abnormal.  Left: Resting left ankle-brachial index is within normal range. No  evidence of significant left lower extremity arterial disease. The left  toe-brachial index is abnormal.   NM Study 09/2018  Normal pharmacologic myocardial perfusion stress test without significant ischemia or scar.  The left ventricular ejection fraction is normal (>65%).  This is a low risk study.  Attenuation correction CT is notable for coronary artery and aortic atherosclerotic calcification, as well as aortic valve and mitral annular calcification.  A moderate-sided hiatal hernia is present.  Hyperdensity of the liver is noted, which can be seen with long-term amiodarone use. Clinical/laboratory correlation recommended.  Splenic calcifications suggest prior granulomatous disease.  Assessment & Plan    Chronic diastolic heart failure (EF 60-65%) Chronic dyspnea on exertion Severe Pulmonary HTN (66.68mmHg) Valvular Dz: Moderate MR/TR and mild to moderate AR --Continues to have dyspnea that remains unchanged from her previous visit.  Unclear etiology of dyspnea with multiple comorbid conditions that could be contributing to her symptoms.  In addition to diastolic heart failure with severely elevated pulmonary pressures, she also has valvular disease as outlined above in most recent echo and a history of A. fib/a flutter.  Updated echo shows grade 2 diastolic dysfunction with severe pulmonary hypertension.  Lasix was  increased at her previous visit after reviewing results of echo to Lasix  40 mg daily with patient reporting improved LEE but unchanged dyspnea.  Euvolemic on exam today s/p increase of Lasix with LLEE though history of L ankle cellulitis and does appear this may be the reason for the L ankle edema. It was noted previously by her primary cardiologist that, if no improvement in her symptoms at follow-up, she should proceed with left and right cardiac catheterization.  Therefore, we will schedule her for [facility] at this time. Risks and benefits of cardiac catheterization have been discussed with the patient.  These include bleeding, infection, kidney damage, stroke, heart attack, death.  The patient understands these risks and is willing to proceed. Check BMET and CBC given her recent contrast exposure and as on Eliquis 2.5mg  BID, which she will hold 2 days before her catheterization. Continue current medications. If no cardiac explanation for dyspnea, referral to pulmonary for PFTs should be considered.    Essential hypertension --Blood pressure improved from previous visits yet still somewhat suboptimal at 138/64.  Lisinopril was increased to 10 mg daily at her last visit in addition to her increased Lasix.  If blood pressure remains elevated at return to clinic, recommend increasing further to lisinopril 20 mg daily.  Will defer for now.  HLD --Continue current statin.  Most recent LDL 50 and at goal.  Paroxysmal atrial flutter and persistent atrial fibrillation --Maintaining NSR.  She is taking amiodarone 100 mg daily and metoprolol 12.5 mg twice daily.  Continue low-dose Eliquis 2.5 mg daily.  She is borderline for ongoing reduced dose Eliquis as she currently only meets age criteria of 86 years old and is slightly above weight cutoff at 62.6 kg today, and we will reassess dosing s/p cath.  If she continues to report dyspnea after [facility], and if cardiac catheterization offers no cardiac explanation, consider referring to pulmonology for PFTs at that time.  Peripheral arterial  disease with cyanosis / cool extremities / pedal pulses undetected but confirmed by echo; nonhealing R foot wound --S/p tibial vessel intervention and managed by Dr. Lucky Cowboy.  Pedal pulses not able to be felt on today's exam and found via Doppler with recommendation that she follow up with [facility] in the office as soon as possible.  She has a right foot wound that is not healing.  Most recent ABIs as above.  Continue ASA and statin.   Medication changes: Hold Eliquis 2 days before her right and left heart cardiac catheterization.  Reassess reduced dose Eliquis after cardiac catheterization, as she no longer meets criteria for reduced dosing / borderline for criteria.  If BP is still elevated at RTC, increase lisinopril. Labs ordered: BMET, CBC. Studies / Imaging ordered: R/LHC. Referral to [facility] for decision regarding lower extremity angiography. Future considerations: Referral to pulmonology for PFTs.  After her cardiac catheterization, reassess dosing of Eliquis.  She is borderline weight for reduced dosing.  If BP remains elevated at follow-up, increase lisinopril. Disposition: Follow-up after her cardiac catheterization.  Total time spent with patient today 45 minutes. This includes reviewing records, evaluating the patient, and coordinating care. Face-to-face time >50%.    Arvil Chaco, PA-C 07/02/2019

## 2019-07-02 NOTE — Patient Instructions (Signed)
Medication Instructions:  Your physician recommends that you continue on your current medications as directed. Please refer to the Current Medication list given to you today.  *If you need a refill on your cardiac medications before your next appointment, please call your pharmacy*   Lab Work: Bmet and Cbc today. Please have your labs drawn at the [facility] medical mall.  You will need a COVID test prior to your procedure. Please have your test performed on 07/08/19 between 12:30-2:30 pm at the [place] building drive up tet site.  If you have labs (blood work) drawn today and your tests are completely normal, you will receive your results only by: Marland Kitchen MyChart Message (if you have MyChart) OR . A paper copy in the mail If you have any lab test that is abnormal or we need to change your treatment, we will call you to review the results.   Testing/Procedures: Your physician has requested that you have a cardiac catheterization. Cardiac catheterization is used to diagnose and/or treat various heart conditions. Doctors may recommend this procedure for a number of different reasons. The most common reason is to evaluate chest pain. Chest pain can be a symptom of coronary artery disease (CAD), and cardiac catheterization can show whether plaque is narrowing or blocking your heart's arteries. This procedure is also used to evaluate the valves, as well as measure the blood flow and oxygen levels in different parts of your heart. For further information please visit [email or web address]. Please follow instruction sheet, as given.     Follow-Up: At [facility], you and your health needs are our priority.  As part of our continuing mission to provide you with exceptional heart care, we have created designated Provider Care Teams.  These Care Teams include your primary Cardiologist (physician) and Advanced Practice Providers (APPs -  Physician Assistants and Nurse Practitioners) who all work together to  provide you with the care you need, when you need it.  We recommend signing up for the patient portal called "MyChart".  Sign up information is provided on this After Visit Summary.  MyChart is used to connect with patients for Virtual Visits (Telemedicine).  Patients are able to view lab/test results, encounter notes, upcoming appointments, etc.  Non-urgent messages can be sent to your provider as well.   To learn more about what you can do with MyChart, go to [email or web address].    Your next appointment:   4 week(s)   We have contacted [facility] and asked that they contact you to schedule an appointment with Dr. Lucky Cowboy asap.  The format for your next appointment:   In Person  Provider:    You may see [name], MD or one of the following Advanced Practice Providers on your designated Care Team:    Murray Hodgkins, NP  Christell Faith, PA-C  Marrianne Mood, PA-C    Other Instructions [facility] Cardiac Cath Instructions   You are scheduled for a Cardiac Cath on:_4/5/21 @9 :30am  Please arrive at 8:30am on the day of your procedure  Please expect a call from our [place] to  pre-register you  Do not eat/drink anything after midnight  Someone will need to drive you home  It is recommended someone be with you for the first 24 hours after  your procedure  Wear clothes that are easy to get on/off and wear slip on shoes if  possible   Medications bring a current list of all medications with you  __X_ Do  not take these medications before your procedure:___Eliquis 48 hours prior to the procedure last dose on Friday evening 07/09/19   HOLD Lasix the morning of the procedure  You may take your other medications the morning of the procedure including your Aspirin with a sip of water  Day of your procedure: Arrive at the [place] entrance.  Free valet service is available.  After entering the [facility] please check-in at the  registration desk (1st desk on your right) to receive your armband. After receiving your armband someone will escort you to the cardiac cath/special procedures waiting area.  The usual length of stay after your procedure is about 2 to 3 hours.  This can vary.  If you have any questions, please call our office at [phone], or you may call the cardiac cath lab at [facility] directly at [phone]

## 2019-07-05 ENCOUNTER — Other Ambulatory Visit (INDEPENDENT_AMBULATORY_CARE_PROVIDER_SITE_OTHER): Payer: Self-pay | Admitting: Nurse Practitioner

## 2019-07-05 ENCOUNTER — Telehealth (INDEPENDENT_AMBULATORY_CARE_PROVIDER_SITE_OTHER): Payer: Self-pay

## 2019-07-05 DIAGNOSIS — Z9862 Peripheral vascular angioplasty status: Secondary | ICD-10-CM

## 2019-07-05 DIAGNOSIS — I70223 Atherosclerosis of native arteries of extremities with rest pain, bilateral legs: Secondary | ICD-10-CM

## 2019-07-05 NOTE — Telephone Encounter (Signed)
I'm ok with her coming in sooner we just need to work with ultrasound to find a space

## 2019-07-05 NOTE — Telephone Encounter (Signed)
Crystal Haw RN from [facility] called and said that the pt need to be seen asap due to having bilateral decreased pulse and discoloration in both feet. Please advise.

## 2019-07-05 NOTE — Telephone Encounter (Signed)
Please see the Np's Note above.

## 2019-07-05 NOTE — Telephone Encounter (Signed)
Bring her in for ABIs and RLE art duplex. Me or Dew

## 2019-07-05 NOTE — Telephone Encounter (Signed)
I spoke with the daughter [name] who left a Voice mail on the nurse's like and she stated she feel that her mother needs to be seen before be schedule appointment on 07/14/2019 due to fear of her losing mobility and her being in pain she wants to know is it possible for her to be seen sometime this week instead. Please advise. She also said he mom is having a heart cath next week.

## 2019-07-05 NOTE — Telephone Encounter (Signed)
Please see the NP's Note.

## 2019-07-06 ENCOUNTER — Ambulatory Visit (INDEPENDENT_AMBULATORY_CARE_PROVIDER_SITE_OTHER): Payer: Medicare Other | Admitting: Nurse Practitioner

## 2019-07-06 ENCOUNTER — Encounter: Payer: Self-pay | Admitting: Radiology

## 2019-07-06 ENCOUNTER — Encounter (INDEPENDENT_AMBULATORY_CARE_PROVIDER_SITE_OTHER): Payer: Self-pay | Admitting: Nurse Practitioner

## 2019-07-06 ENCOUNTER — Telehealth: Payer: Self-pay

## 2019-07-06 ENCOUNTER — Other Ambulatory Visit: Payer: Self-pay

## 2019-07-06 ENCOUNTER — Ambulatory Visit (INDEPENDENT_AMBULATORY_CARE_PROVIDER_SITE_OTHER): Payer: Medicare Other

## 2019-07-06 ENCOUNTER — Emergency Department: Payer: Medicare Other

## 2019-07-06 ENCOUNTER — Telehealth: Payer: Self-pay | Admitting: Cardiovascular Disease

## 2019-07-06 ENCOUNTER — Encounter: Payer: Self-pay | Admitting: Emergency Medicine

## 2019-07-06 ENCOUNTER — Emergency Department
Admission: EM | Admit: 2019-07-06 | Discharge: 2019-07-06 | Disposition: A | Discharge status: Home / Self Care | Payer: Medicare Other | Attending: Emergency Medicine | Admitting: Emergency Medicine

## 2019-07-06 VITALS — BP 176/69 | HR 60 | Resp 14 | Ht 61.0 in | Wt 138.0 lb

## 2019-07-06 DIAGNOSIS — I10 Essential (primary) hypertension: Secondary | ICD-10-CM

## 2019-07-06 DIAGNOSIS — I110 Hypertensive heart disease with heart failure: Secondary | ICD-10-CM | POA: Insufficient documentation

## 2019-07-06 DIAGNOSIS — R0789 Other chest pain: Secondary | ICD-10-CM | POA: Insufficient documentation

## 2019-07-06 DIAGNOSIS — Z87891 Personal history of nicotine dependence: Secondary | ICD-10-CM | POA: Diagnosis not present

## 2019-07-06 DIAGNOSIS — Z9862 Peripheral vascular angioplasty status: Secondary | ICD-10-CM

## 2019-07-06 DIAGNOSIS — I70223 Atherosclerosis of native arteries of extremities with rest pain, bilateral legs: Secondary | ICD-10-CM | POA: Diagnosis not present

## 2019-07-06 DIAGNOSIS — Z7982 Long term (current) use of aspirin: Secondary | ICD-10-CM | POA: Insufficient documentation

## 2019-07-06 DIAGNOSIS — M549 Dorsalgia, unspecified: Secondary | ICD-10-CM | POA: Insufficient documentation

## 2019-07-06 DIAGNOSIS — Z79899 Other long term (current) drug therapy: Secondary | ICD-10-CM | POA: Diagnosis not present

## 2019-07-06 DIAGNOSIS — I5030 Unspecified diastolic (congestive) heart failure: Secondary | ICD-10-CM | POA: Diagnosis not present

## 2019-07-06 DIAGNOSIS — R600 Localized edema: Secondary | ICD-10-CM | POA: Diagnosis not present

## 2019-07-06 DIAGNOSIS — I739 Peripheral vascular disease, unspecified: Secondary | ICD-10-CM

## 2019-07-06 DIAGNOSIS — R0602 Shortness of breath: Secondary | ICD-10-CM | POA: Diagnosis not present

## 2019-07-06 DIAGNOSIS — Z85828 Personal history of other malignant neoplasm of skin: Secondary | ICD-10-CM | POA: Insufficient documentation

## 2019-07-06 DIAGNOSIS — R079 Chest pain, unspecified: Secondary | ICD-10-CM | POA: Diagnosis not present

## 2019-07-06 DIAGNOSIS — Z7901 Long term (current) use of anticoagulants: Secondary | ICD-10-CM | POA: Insufficient documentation

## 2019-07-06 DIAGNOSIS — K5900 Constipation, unspecified: Secondary | ICD-10-CM | POA: Insufficient documentation

## 2019-07-06 DIAGNOSIS — Z01818 Encounter for other preprocedural examination: Secondary | ICD-10-CM | POA: Diagnosis not present

## 2019-07-06 DIAGNOSIS — I2510 Atherosclerotic heart disease of native coronary artery without angina pectoris: Secondary | ICD-10-CM

## 2019-07-06 DIAGNOSIS — M25511 Pain in right shoulder: Secondary | ICD-10-CM | POA: Diagnosis not present

## 2019-07-06 DIAGNOSIS — I714 Abdominal aortic aneurysm, without rupture: Secondary | ICD-10-CM | POA: Diagnosis not present

## 2019-07-06 LAB — CBC
HCT: 37.3 % (ref 36.0–46.0)
Hemoglobin: 12.4 g/dL (ref 12.0–15.0)
MCH: 32.9 pg (ref 26.0–34.0)
MCHC: 33.2 g/dL (ref 30.0–36.0)
MCV: 98.9 fL (ref 80.0–100.0)
Platelets: 242 10*3/uL (ref 150–400)
RBC: 3.77 MIL/uL — ABNORMAL LOW (ref 3.87–5.11)
RDW: 13.4 % (ref 11.5–15.5)
WBC: 5.8 10*3/uL (ref 4.0–10.5)
nRBC: 0 % (ref 0.0–0.2)

## 2019-07-06 LAB — BASIC METABOLIC PANEL
Anion gap: 10 (ref 5–15)
BUN: 17 mg/dL (ref 8–23)
CO2: 30 mmol/L (ref 22–32)
Calcium: 9.3 mg/dL (ref 8.9–10.3)
Chloride: 101 mmol/L (ref 98–111)
Creatinine, Ser: 0.98 mg/dL (ref 0.44–1.00)
GFR calc Af Amer: 60 mL/min — ABNORMAL LOW (ref >60)
GFR calc non Af Amer: 52 mL/min — ABNORMAL LOW (ref >60)
Glucose, Bld: 107 mg/dL — ABNORMAL HIGH (ref 70–99)
Potassium: 3.6 mmol/L (ref 3.5–5.1)
Sodium: 141 mmol/L (ref 135–145)

## 2019-07-06 LAB — HEPATIC FUNCTION PANEL
ALT: 17 U/L (ref 0–44)
AST: 19 U/L (ref 15–41)
Albumin: 4.1 g/dL (ref 3.5–5.0)
Alkaline Phosphatase: 42 U/L (ref 38–126)
Bilirubin, Direct: <0.1 mg/dL (ref 0.0–0.2)
Total Bilirubin: 0.6 mg/dL (ref 0.3–1.2)
Total Protein: 7 g/dL (ref 6.5–8.1)

## 2019-07-06 LAB — TROPONIN I (HIGH SENSITIVITY)
Troponin I (High Sensitivity): 6 ng/L (ref <18)
Troponin I (High Sensitivity): 6 ng/L (ref <18)

## 2019-07-06 LAB — LIPASE, BLOOD: Lipase: 25 U/L (ref 11–51)

## 2019-07-06 MED ORDER — ONDANSETRON HCL 4 MG/2ML IJ SOLN
4.0000 mg | Freq: Once | INTRAMUSCULAR | Status: AC
  Administered 2019-07-06: 17:00:00 4 mg via INTRAVENOUS
  Filled 2019-07-06: qty 2

## 2019-07-06 MED ORDER — MORPHINE SULFATE (PF) 4 MG/ML IV SOLN
4.0000 mg | Freq: Once | INTRAVENOUS | Status: AC
  Administered 2019-07-06: 4 mg via INTRAVENOUS
  Filled 2019-07-06: qty 1

## 2019-07-06 MED ORDER — LIDOCAINE 5 % EX PTCH
1.0000 | MEDICATED_PATCH | CUTANEOUS | Status: DC
  Administered 2019-07-06: 17:00:00 1 via TRANSDERMAL
  Filled 2019-07-06: qty 1

## 2019-07-06 MED ORDER — IOHEXOL 350 MG/ML SOLN
75.0000 mL | Freq: Once | INTRAVENOUS | Status: AC | PRN
  Administered 2019-07-06: 125 mL via INTRAVENOUS

## 2019-07-06 NOTE — Telephone Encounter (Signed)
Returned the call to the patients daughter [name]. Maryan Puls that the patient has been having chest pain during inspiration she reported the same symptoms at her 07/02/19 appt. The pain is sharp and starts at the center of her chest and radiates through her back. Pain improves with exhalation. The pain is reproducible with deep inhalation and positioning. Patient reports DOE with ambulating short distances (this is unchanged). She denies n/v, cough, diaphoresis, increased swelling. The chest pain is not associated with sob. No VS provided.  Advised the [name] that I did not think the patient was having a cardiac event. Patient has severe pulmonary hypertension. Saralyn Pilar that Dr. Fletcher Anon is out of the office until 07/12/19. Patient was sen by Marrianne Mood, PA on 07/02/19. Adv [name] that Malachi Bonds is rounding in the hospital. Saralyn Pilar that I will fwd the message to [facility] for further advice.  In the interim a dvised them to call the patients pcp Dr. Katheran James office asap, patients symptoms may be related to her lungs and she may need a chest xray. The patient does not seem to be in acute distress. They are to call 911 if symptoms worsen.  [name] agreeable with the plan and voiced appreciation for the assistance.

## 2019-07-06 NOTE — ED Triage Notes (Signed)
Pt in via POV, reports generalized chest pain with radiation through to back and right shoulder x a few days, reports associated shortness of breath.  NAD noted at this time.

## 2019-07-06 NOTE — Progress Notes (Signed)
Subjective:    Patient ID: [name], female    DOB: 08-02-1929, 86 y.o.   MRN: [id number] Chief Complaint  Patient presents with  . Follow-up    U/S Follow up    Patient presents today after visiting her cardiologist office and there was concern due to the patient's lower extremity discoloration and edema.  Previously in our office the patient has had some discoloration however it was more restricted to her toe areas and it had actually improved greatly since her most recent angiogram.  However, today the patient has bluish/purplish discoloration of her right lower extremity and erythematous discoloration of her left.  The left has more swelling in the foot as well as the ankle, whereas the right has it more so in the ankle area.  The right lower extremity also has an ulceration on the side of the foot that is very painful for the patient.  The patient is barely able to put weight on it because it is so painful.  Because of this is actually altering her gait.  The patient also complains of a burning hot feeling in her bilateral feet.  The patient does have a known previous history of neuropathy.  The patient has been seeing the podiatrist for her right toe wound however despite treatment with podiatry it is still failed to heal or get smaller.  Complicating matters the patient is also having some worsening shortness of breath with exertion.  The patient also has a number of cardiovascular issues including heart failure with preserved ejection fraction, regurgitation in the mitral, aortic and tricuspid valves, and persistent atrial fibrillation.  Based on her worsening shortness of breath the patient is scheduled to have a heart catheterization next week.  Today noninvasive studies show an ABI of 0.99 on the right lower extremity with a TBI 0.28.  The left lower extremity has an ABI of 1.09 with a TBI 0.30.  Previously on 04/23/2019 the right ABI was 1.12 with a TBI of 0.45.  The left had an ABI  1.09 but with a TBI of 0.20.  Patient also underwent bilateral lower extremity arterial duplexes.  The right lower extremity has triphasic waveforms down to the level of the tibial arteries where it transitions to monophasic waveforms.  The left lower extremity has biphasic waveforms down to the distal SFA where it transitions to monophasic waveforms but with biphasic waveforms at the distal peroneal artery.   Review of Systems  Constitutional: Positive for fatigue.  Respiratory: Positive for shortness of breath.   Cardiovascular: Positive for palpitations and leg swelling.  Musculoskeletal: Positive for back pain, gait problem and joint swelling.  Skin: Positive for color change and wound.  All other systems reviewed and are negative.      Objective:   Physical Exam Vitals reviewed.  Constitutional:      Appearance: Normal appearance.  Cardiovascular:     Rate and Rhythm: Rhythm irregular.     Pulses:          Dorsalis pedis pulses are 1+ on the right side and detected w/ Doppler on the left side.       Posterior tibial pulses are detected w/ Doppler on the left side.  Musculoskeletal:     Right lower leg: Tenderness present. 1+ Edema present.     Left lower leg: 1+ Pitting Edema present.  Feet:     Right foot:     Skin integrity: Ulcer (Cyanotic appearing towards toes, as well as cool toes)  present.     Left foot:     Skin integrity: Erythema and warmth present.  Skin:    Capillary Refill: Capillary refill takes 2 to 3 seconds.  Neurological:     Mental Status: She is alert and oriented to person, place, and time. Mental status is at baseline.     Motor: Weakness present.     Coordination: Coordination abnormal.     Gait: Gait abnormal.  Psychiatric:        Mood and Affect: Mood normal.        Behavior: Behavior normal.        Thought Content: Thought content normal.        Judgment: Judgment normal.     BP (!) 176/69   Pulse 60   Resp 14   Ht 5\' 1"  (1.549 m)   Wt  138 lb (62.6 kg)   BMI 26.07 kg/m   Past Medical History:  Diagnosis Date  . Abdominal aortic atherosclerosis ([place]) 09/2015   by xray  . Anxiety   . BCC (basal cell carcinoma of skin) 05/2011   on nose  . Breast lesion    a. diagnosed with complex sclerosing lesion with calcifications of the right breast in 08/2016  . Chest pain    a. nuclear stress test 03/2012: normal; b. 09/2018 MV: EF >65%, no isch/scar.   . DDD (degenerative disc disease), lumbosacral 12/06/1992  . Gallstones 09/2015   incidentally by xray  . GERD (gastroesophageal reflux disease) 05/1999  . HERPES ZOSTER 04/13/2007   Qualifier: Diagnosis of  By: [name], [name]   . Hiatal hernia   . History of shingles    ophthalmic, takes acyclovir daily  . Hyperlipidemia 12/2003  . Hypertension 1990  . Mitral regurgitation    a. echo 03/2012: EF 60-65%, no RWMA, mild to mod AI/MR, mod TR, PASP 37 mmHg  . Osteoarthritis 08/21/1989  . Osteoporosis with fracture 11/1997   with compression fracture T12  . Persistent atrial fibrillation ([place]) 03/16/2012   a. CHADS2VASc => 5 (HTN, age x 2, vascular disease, sex category)-->Eliquis 5 BID;  b. Recurrent AF in 06/2016;  c. 01/2017 s/p DCCV;  d. 02/2017 recurrent AFib->Amio added->DCCV; e. 03/2017 recurrent AFib, s/p reload of amio and DCCV 04/02/2017  . Rheumatoid arthritis ([place])   . RLS (restless legs syndrome)     Social History   Socioeconomic History  . Marital status: Widowed    Spouse name: Not on file  . Number of children: 5  . Years of education: Not on file  . Highest education level: Not on file  Occupational History  . Occupation: Retired - Occupational psychologist: RETIRED  Tobacco Use  . Smoking status: Former Smoker    Packs/day: 0.25    Years: 1.00    Pack years: 0.25    Quit date: 04/08/1976    Years since quitting: 43.2  . Smokeless tobacco: Never Used  . Tobacco comment: pt smokes occasionally "socially"  Substance and Sexual Activity  .  Alcohol use: No    Alcohol/week: 1.0 standard drinks    Types: 1 Glasses of wine per week    Comment: occassional  . Drug use: No  . Sexual activity: Never  Other Topics Concern  . Not on file  Social History Narrative   Lives with grandson who smokes, dog   From [place].  Widowed 01/1999; 1st marriage 25 years / 2nd marriage 13 years.   One son deceased 2023/02/13.  Activity: active with dog   Diet: good water, fruits/vegetables daily   Social Determinants of Health   Financial Resource Strain: Low Risk   . Difficulty of Paying Living Expenses: Not hard at all  Food Insecurity: No Food Insecurity  . Worried About Charity fundraiser in the Last Year: Never true  . Ran Out of Food in the Last Year: Never true  Transportation Needs: No Transportation Needs  . Lack of Transportation (Medical): No  . Lack of Transportation (Non-Medical): No  Physical Activity: Inactive  . Days of Exercise per Week: 0 days  . Minutes of Exercise per Session: 0 min  Stress: No Stress Concern Present  . Feeling of Stress : Not at all  Social Connections:   . Frequency of Communication with Friends and Family:   . Frequency of Social Gatherings with Friends and Family:   . Attends Religious Services:   . Active Member of Clubs or Organizations:   . Attends Archivist Meetings:   Marland Kitchen Marital Status:   Intimate Partner Violence: Not At Risk  . Fear of Current or Ex-Partner: No  . Emotionally Abused: No  . Physically Abused: No  . Sexually Abused: No    Past Surgical History:  Procedure Laterality Date  . ABDOMINAL HYSTERECTOMY  Age 20 - 51   S/P TAH  . abdominal ultrasound  04/23/2004   Gallstones  . BREAST EXCISIONAL BIOPSY Right 2018   neg/benign complex sclerosing lesion  . BREAST LUMPECTOMY WITH RADIOACTIVE SEED LOCALIZATION Right 10/2016   breast lumpectomy with radioactive seed localization and margin assessment for complex sclerosing lesion [facility])  . BREAST LUMPECTOMY  WITH RADIOACTIVE SEED LOCALIZATION Right 11/05/2016   Procedure: RIGHT BREAST LUMPECTOMY WITH RADIOACTIVE SEED LOCALIZATION;  Surgeon: Fanny Skates, MD;  Location: [facility];  Service: General;  Laterality: Right;  . CARDIOVERSION N/A 01/31/2017   Procedure: CARDIOVERSION;  Surgeon: [place], MD;  Location: [facility];  Service: Cardiovascular;  Laterality: N/A;  . CARDIOVERSION N/A 03/03/2017   Procedure: CARDIOVERSION;  Surgeon: Wellington [place], MD;  Location: [facility];  Service: Cardiovascular;  Laterality: N/A;  . CARDIOVERSION N/A 04/02/2017   Procedure: CARDIOVERSION;  Surgeon: Wellington [place], MD;  Location: [facility];  Service: Cardiovascular;  Laterality: N/A;  . CARDIOVERSION N/A 01/16/2018   Procedure: CARDIOVERSION;  Surgeon: Wellington [place], MD;  Location: [facility];  Service: Cardiovascular;  Laterality: N/A;  . CATARACT EXTRACTION  05/2010   left eye  . CERVICAL DISCECTOMY  1992   Fusion (Dr. Joya Salm)  . ESI Bilateral 01/2016   S1 transforaminal ESI x3  . ESOPHAGOGASTRODUODENOSCOPY  01/01/2002   with ulcer, bx. neg; + stricture gastric ulcer with hemorrhage  . ESOPHAGOGASTRODUODENOSCOPY  04/23/2004   Esophageal stricture -- dilated  . LOWER EXTREMITY ANGIOGRAPHY Left 02/01/2019   LOWER EXTREMITY ANGIOGRAPHY;  Surgeon: Algernon Huxley, MD  . LOWER EXTREMITY ANGIOGRAPHY Right 03/25/2019   Procedure: LOWER EXTREMITY ANGIOGRAPHY;  Surgeon: Algernon Huxley, MD;  Location: [place] [facility];  Service: Cardiovascular;  Laterality: Right;  . nuclear stress test  03/2012   no ischemia  . PERIPHERAL VASCULAR BALLOON ANGIOPLASTY Left 01/2019   Percutaneous transluminal angioplasty of left anterior and posterior tibial artery with 2.5 mm diameter by 22 cm length angioplasty balloon ([place])  . SKIN CANCER EXCISION  05/20/2011   BCC from nose  . US ECHOCARDIOGRAPHY  03/2012   in flutter, EF 60%, mild-mod aortic, mitral, tricuspid regurg, mildly dilated LA  Family History  Problem Relation Age of Onset  . Emphysema Mother   . Heart disease Father        MI  . Stroke Father        first at age 84.  . Stroke Sister   . Diabetes Sister   . Hypertension Brother   . Heart disease Brother        Heart Valve  . Stroke Brother   . Diabetes Brother   . Cancer Brother        esophageal  . Diabetes Brother   . Diabetes Brother   . Colon cancer Neg Hx     Allergies  Allergen Reactions  . Penicillins Rash and Other (See Comments)    Childhood Allergy Has patient had a PCN reaction causing immediate rash, facial/tongue/throat swelling, SOB or lightheadedness with hypotension: Yes Has patient had a PCN reaction causing severe rash involving mucus membranes or skin necrosis: Unknown Has patient had a PCN reaction that required hospitalization: No Has patient had a PCN reaction occurring within the last 10 years: No If all of the above answers are "NO", then may proceed with Cephalosporin use.        Assessment & Plan:   1. PAD (peripheral artery disease) (HCC)  Recommend:  The patient has evidence of severe atherosclerotic changes of both lower extremities associated with ulceration and tissue loss of the foot.  This represents a limb threatening ischemia and places the patient at the risk for limb loss.  However, the patient has been having some profound shortness of breath and a heart catheterization scheduled in several days.  The heart catheterization will take precedent in this instance and so we will plan for an angiogram 1 week after her heart catheterization.  Patient should undergo angiography of the right lower extremity with the hope for intervention for limb salvage.  The risks and benefits as well as the alternative therapies was discussed in detail with the patient.  All questions were answered.  Patient agrees to proceed with angiography.  The patient will follow up with me in the office after the procedure.    2.  Essential hypertension Continue antihypertensive medications as already ordered, these medications have been reviewed and there are no changes at this time.   3. Localized edema Discussion with the patient and in regards to the patient's lower extremity edema and discoloration.  Despite revascularization the patient does have some persistent discoloration of her lower extremities.  Today however was worse than the last time I saw the patient.  In regards to her swelling this is likely multifactorial in cause.  The patient has diastolic heart failure, persistent atrial fibrillation in addition to regurgitation present in the mitral, tricuspid and aortic valves all of which are averaged to be about moderate.  This combined with her limited mobility are all likely compromising her venous return.  Generally treatment for this would be using medical grade 1 compression stockings in addition to elevating her lower extremities and exercise however at this time with the wound in the patient's foot she likely would not tolerate wearing medical grade 1 compression stockings.  We will reevaluate with the patient, and have her follow-up visit after her angiogram.  Otherwise the patient is advised to elevate her extremities as much as possible.   Current Outpatient Medications on File Prior to Visit  Medication Sig Dispense Refill  . acetaminophen (TYLENOL) 500 MG tablet Take 1,000 mg every 8 (eight) hours as needed by mouth  for moderate pain.     Marland Kitchen acyclovir (ZOVIRAX) 400 MG tablet Take 400 mg 2 (two) times daily by mouth.     Marland Kitchen amiodarone (PACERONE) 100 MG tablet Take 1 tablet (100 mg total) by mouth daily. (Patient taking differently: Take 200 mg by mouth daily. )    . aspirin EC 81 MG tablet Take 1 tablet (81 mg total) by mouth daily. 150 tablet 2  . atorvastatin (LIPITOR) 20 MG tablet TAKE ONE TABLET BY MOUTH EVERY DAY (Patient taking differently: Take 20 mg by mouth daily. ) 90 tablet 2  . DULoxetine  (CYMBALTA) 60 MG capsule TAKE 1 CAPSULE BY MOUTH EVERY DAY (Patient taking differently: Take 60 mg by mouth daily. ) 90 capsule 1  . ELIQUIS 2.5 MG TABS tablet TAKE ONE TABLET BY MOUTH TWICE DAILY (Patient taking differently: Take 2.5 mg by mouth 2 (two) times daily. ) 180 tablet 1  . ergocalciferol (VITAMIN D2) 1.25 MG (50000 UT) capsule Take 1 capsule (50,000 Units total) by mouth once a week. (Patient taking differently: Take 50,000 Units by mouth once a week. Saturday) 12 capsule 3  . furosemide (LASIX) 40 MG tablet Take 1 tablet (40 mg total) by mouth daily. 30 tablet 5  . gabapentin (NEURONTIN) 300 MG capsule Take 1 capsule (300 mg total) by mouth at bedtime.    Marland Kitchen lisinopril (ZESTRIL) 10 MG tablet Take 1 tablet (10 mg total) by mouth daily. 30 tablet 5  . metoprolol tartrate (LOPRESSOR) 25 MG tablet Take 0.5 tablets (12.5 mg total) by mouth 2 (two) times daily. 90 tablet 2  . pantoprazole (PROTONIX) 40 MG tablet Take 1 tablet (40 mg total) by mouth daily. 90 tablet 0  . potassium chloride (KLOR-CON) 20 MEQ packet Take 20 mEq by mouth daily. (Patient taking differently: Take 20 mEq by mouth 2 (two) times daily. ) 30 each 11  . prednisoLONE acetate (PRED FORTE) 1 % ophthalmic suspension Place 1 drop into the left eye daily.     Marland Kitchen rOPINIRole (REQUIP) 1 MG tablet TAKE 2 TABLETS BY MOUTH AT BEDTIME (Patient taking differently: Take 2 mg by mouth at bedtime. ) 180 tablet 3  . tiZANidine (ZANAFLEX) 2 MG tablet Take 2 mg by mouth every evening.     . traMADol (ULTRAM) 50 MG tablet Take 50 mg by mouth daily.   0   Current Facility-Administered Medications on File Prior to Visit  Medication Dose Route Frequency Provider Last Rate Last Admin  . sodium chloride flush (NS) 0.9 % injection 3 mL  3 mL Intravenous Q12H Visser, Jacquelyn D, PA-C        There are no Patient Instructions on file for this visit. No follow-ups on file.   Kris Hartmann, NP

## 2019-07-06 NOTE — Telephone Encounter (Signed)
Patient's daughter calling in stating patient is having issue in getting deep breaths. When sitting, breathing is fine until she tries to take a deep breath, it hurts her. It is a sharp pain in front of chest and when she breaths out is releases through her back Patient has experienced this since last night. Patient is also scheduled for a heart cath on 4/5.   Please advise when able

## 2019-07-06 NOTE — Telephone Encounter (Signed)
[name] (DNR signed) said that since yesterday pt has SOB upon exertion and at times has SOB last night when resting. Pt has mid chest sharpe continuous CP that radiates to the back. At times the back hurts worse than the chest; pain level now is 8-9. If pt takes a deep breath and certain way pt moves pt has more pain in chest and back. [name] spoke with cardiology office because pt is to have a cardiac cath on 07/12/19 and [name] was advised to contact PCP to see if could be pulmonary. Pt was at vascular surgeons office this morning and BP 176/69 P 60 and R 14. Pt has diastolic CHF and Lasix being increased to 40 mg has not helped dyspnea. Pt does have swelling in feet for over 1 yr but no lower leg swelling. Pt remains tired and sleeps a lot. Dr Darnell Level said pt should go to ED for eval and possible xray. [name] asked if I would call ahead to [facility] ED to let them know what is going on with pt and that pt should be there around 2:15 -2:30 by car. I spoke with Nira Conn at [facility] ED and [facility] notified with above info and voiced understanding. Per chart review tab pt is at [facility] ED now. FYI to Dr Darnell Level.

## 2019-07-06 NOTE — Discharge Instructions (Addendum)
Cardiac markers were negative.  CT scan as below.  Take Tylenol 1 g every 8 hours and use the lidocaine patch to help with the discomfort.  Follow-up with your outpatient appointments   1. Mild atherosclerotic disease throughout the aorta without  evidence for an aortic aneurysm or dissection. Aortic  Atherosclerosis (ICD10-I70.0).  2. No evidence for inflow or outflow disease.  3. Concern for bilateral runoff disease but limited evaluation of  the runoff vessels bilaterally. Minimal flow in the distal runoff  vessels but this could be related to technical factors and timing of  the study.  4. At least mild stenosis involving the main renal arteries.  5. No evidence for a large or central pulmonary embolism.     NON-VASCULAR     1. Vertebral body compression fractures at T5 and L1. Age of these  fractures are unknown but the fractures could be associated with the  patient's pain.  2. Cholelithiasis with distended gallbladder. No evidence for  gallbladder inflammation.  3. Large amount of stool in the colon.  4. Moderate sized hiatal hernia.  5. No acute chest abnormality.

## 2019-07-06 NOTE — ED Provider Notes (Signed)
[facility] Emergency Department Provider Note  ____________________________________________   None    (approximate)  I have reviewed the triage vital signs and the nursing notes.   HISTORY  Chief Complaint Chest Pain    HPI [name] is a 86 y.o. female with history of chest pain stress test in 2020 with no ischemia, mitral regurgitation, atrial fibrillation on eliquis, hypertension, hyperlipidemia, diastolic CHF on lasix, pulmonary HTN who comes in with chest pain.  It looks like pt is planned to have cardiac cath on 4/5.  Patient also being seen by vascular for severe peripheral vascular disease of her legs.  They are planning to do outpatient angiography for that.  Patient states that yesterday she developed chest pain that radiated to her back, severe, constant, worse with position changes, better at rest.  Denies having this previously.  States that it also hurts to take a deep breath but she is been compliant with her blood thinner that her daughter gives her.  Otherwise does not really feel short of breath.       Past Medical History:  Diagnosis Date  . Abdominal aortic atherosclerosis ([place]) 09/2015   by xray  . Anxiety   . BCC (basal cell carcinoma of skin) 05/2011   on nose  . Breast lesion    a. diagnosed with complex sclerosing lesion with calcifications of the right breast in 08/2016  . Chest pain    a. nuclear stress test 03/2012: normal; b. 09/2018 MV: EF >65%, no isch/scar.   . DDD (degenerative disc disease), lumbosacral 12/06/1992  . Gallstones 09/2015   incidentally by xray  . GERD (gastroesophageal reflux disease) 05/1999  . HERPES ZOSTER 04/13/2007   Qualifier: Diagnosis of  By: [name], [name]   . Hiatal hernia   . History of shingles    ophthalmic, takes acyclovir daily  . Hyperlipidemia 12/2003  . Hypertension 1990  . Mitral regurgitation    a. echo 03/2012: EF 60-65%, no RWMA, mild to mod AI/MR, mod TR,  PASP 37 mmHg  . Osteoarthritis 08/21/1989  . Osteoporosis with fracture 11/1997   with compression fracture T12  . Persistent atrial fibrillation ([place]) 03/16/2012   a. CHADS2VASc => 5 (HTN, age x 2, vascular disease, sex category)-->Eliquis 5 BID;  b. Recurrent AF in 06/2016;  c. 01/2017 s/p DCCV;  d. 02/2017 recurrent AFib->Amio added->DCCV; e. 03/2017 recurrent AFib, s/p reload of amio and DCCV 04/02/2017  . Rheumatoid arthritis ([place])   . RLS (restless legs syndrome)     Patient Active Problem List   Diagnosis Date Noted  . Urge incontinence 05/06/2019  . Skin lesion on examination 05/06/2019  . PAD (peripheral artery disease) ([place]) 03/12/2019  . Bilateral foot pain 01/28/2019  . Hypertrophic toenail 01/27/2019  . Cyanosis 01/12/2019  . Hemorrhoid 11/25/2018  . Vitamin B12 deficiency 08/04/2018  . Bilateral leg and foot pain 07/31/2018  . Vitamin D deficiency 02/25/2018  . Peripheral neuropathy 12/04/2017  . General unsteadiness 10/01/2017  . Constipation 10/01/2017  . Esophageal dysphagia 04/08/2017  . Chronic radicular pain of lower back 04/08/2017  . Fall 03/04/2017  . Encounter for chronic pain management 11/11/2016  . Breast mass, right 09/29/2016  . Abdominal aortic atherosclerosis ([place]) 09/07/2015  . Insomnia 06/08/2015  . Health maintenance examination 12/08/2014  . Advanced care planning/counseling discussion 12/08/2014  . Chronic leg pain 10/13/2014  . Persistent atrial fibrillation ([place]) 04/24/2013  . Medicare annual wellness visit, subsequent 04/14/2012  . Chest tightness  03/16/2012  . RLS (restless legs syndrome) 02/07/2012  . DDD (degenerative disc disease), lumbar 12/18/2009  . MDD (major depressive disorder), recurrent episode ([place]) 12/10/2007  . POSTHERPETIC NEURALGIA 04/20/2007  . HLD (hyperlipidemia) 11/24/2006  . PLMD (periodic limb movement disorder) 11/24/2006  . Essential hypertension 11/24/2006  . GERD 11/24/2006  . GASTRIC ULCER 11/24/2006  .  Osteoarthritis 11/24/2006  . Osteoporosis 11/24/2006    Past Surgical History:  Procedure Laterality Date  . ABDOMINAL HYSTERECTOMY  Age 60 - 14   S/P TAH  . abdominal ultrasound  04/23/2004   Gallstones  . BREAST EXCISIONAL BIOPSY Right 2018   neg/benign complex sclerosing lesion  . BREAST LUMPECTOMY WITH RADIOACTIVE SEED LOCALIZATION Right 10/2016   breast lumpectomy with radioactive seed localization and margin assessment for complex sclerosing lesion [facility])  . BREAST LUMPECTOMY WITH RADIOACTIVE SEED LOCALIZATION Right 11/05/2016   Procedure: RIGHT BREAST LUMPECTOMY WITH RADIOACTIVE SEED LOCALIZATION;  Surgeon: Fanny Skates, MD;  Location: [facility];  Service: General;  Laterality: Right;  . CARDIOVERSION N/A 01/31/2017   Procedure: CARDIOVERSION;  Surgeon: Wellington Hampshire, MD;  Location: [facility];  Service: Cardiovascular;  Laterality: N/A;  . CARDIOVERSION N/A 03/03/2017   Procedure: CARDIOVERSION;  Surgeon: Wellington Hampshire, MD;  Location: [facility];  Service: Cardiovascular;  Laterality: N/A;  . CARDIOVERSION N/A 04/02/2017   Procedure: CARDIOVERSION;  Surgeon: Wellington Hampshire, MD;  Location: [facility];  Service: Cardiovascular;  Laterality: N/A;  . CARDIOVERSION N/A 01/16/2018   Procedure: CARDIOVERSION;  Surgeon: Wellington Hampshire, MD;  Location: [facility];  Service: Cardiovascular;  Laterality: N/A;  . CATARACT EXTRACTION  05/2010   left eye  . CERVICAL DISCECTOMY  1992   Fusion (Dr. Joya Salm)  . ESI Bilateral 01/2016   S1 transforaminal ESI x3  . ESOPHAGOGASTRODUODENOSCOPY  01/01/2002   with ulcer, bx. neg; + stricture gastric ulcer with hemorrhage  . ESOPHAGOGASTRODUODENOSCOPY  04/23/2004   Esophageal stricture -- dilated  . LOWER EXTREMITY ANGIOGRAPHY Left 02/01/2019   LOWER EXTREMITY ANGIOGRAPHY;  Surgeon: Algernon Huxley, MD  . LOWER EXTREMITY ANGIOGRAPHY Right 03/25/2019   Procedure: LOWER EXTREMITY ANGIOGRAPHY;  Surgeon: Algernon Huxley, MD;   Location: [facility];  Service: Cardiovascular;  Laterality: Right;  . nuclear stress test  03/2012   no ischemia  . PERIPHERAL VASCULAR BALLOON ANGIOPLASTY Left 01/2019   Percutaneous transluminal angioplasty of left anterior and posterior tibial artery with 2.5 mm diameter by 22 cm length angioplasty balloon ([place])  . SKIN CANCER EXCISION  05/20/2011   BCC from nose  . US ECHOCARDIOGRAPHY  03/2012   in flutter, EF 60%, mild-mod aortic, mitral, tricuspid regurg, mildly dilated LA    Prior to Admission medications   Medication Sig Start Date End Date Taking? Authorizing Provider  acetaminophen (TYLENOL) 500 MG tablet Take 1,000 mg every 8 (eight) hours as needed by mouth for moderate pain.     [provider]  acyclovir (ZOVIRAX) 400 MG tablet Take 400 mg 2 (two) times daily by mouth.     [provider]  amiodarone (PACERONE) 100 MG tablet Take 1 tablet (100 mg total) by mouth daily. Patient taking differently: Take 200 mg by mouth daily.  05/28/19   Wellington Hampshire, MD  aspirin EC 81 MG tablet Take 1 tablet (81 mg total) by mouth daily. 02/01/19   Algernon Huxley, MD  atorvastatin (LIPITOR) 20 MG tablet TAKE ONE TABLET BY MOUTH EVERY DAY Patient taking differently: Take 20 mg by  mouth daily.  07/10/18   Ria Bush, MD  DULoxetine (CYMBALTA) 60 MG capsule TAKE 1 CAPSULE BY MOUTH EVERY DAY Patient taking differently: Take 60 mg by mouth daily.  11/12/18   Ria Bush, MD  ELIQUIS 2.5 MG TABS tablet TAKE ONE TABLET BY MOUTH TWICE DAILY Patient taking differently: Take 2.5 mg by mouth 2 (two) times daily.  01/13/19   Wellington Hampshire, MD  ergocalciferol (VITAMIN D2) 1.25 MG (50000 UT) capsule Take 1 capsule (50,000 Units total) by mouth once a week. Patient taking differently: Take 50,000 Units by mouth once a week. Saturday 05/17/19   Ria Bush, MD  furosemide (LASIX) 40 MG tablet Take 1 tablet (40 mg total) by mouth daily. 05/28/19   Wellington Hampshire,  MD  gabapentin (NEURONTIN) 300 MG capsule Take 1 capsule (300 mg total) by mouth at bedtime. 05/05/19   Ria Bush, MD  lisinopril (ZESTRIL) 10 MG tablet Take 1 tablet (10 mg total) by mouth daily. 05/28/19   Wellington Hampshire, MD  metoprolol tartrate (LOPRESSOR) 25 MG tablet Take 0.5 tablets (12.5 mg total) by mouth 2 (two) times daily. 08/13/18   Wellington Hampshire, MD  pantoprazole (PROTONIX) 40 MG tablet Take 1 tablet (40 mg total) by mouth daily. 04/30/19   Ria Bush, MD  potassium chloride (KLOR-CON) 20 MEQ packet Take 20 mEq by mouth daily. Patient taking differently: Take 20 mEq by mouth 2 (two) times daily.  06/08/19   Ria Bush, MD  prednisoLONE acetate (PRED FORTE) 1 % ophthalmic suspension Place 1 drop into the left eye daily.     [provider]  rOPINIRole (REQUIP) 1 MG tablet TAKE 2 TABLETS BY MOUTH AT BEDTIME Patient taking differently: Take 2 mg by mouth at bedtime.  06/14/19   Ria Bush, MD  tiZANidine (ZANAFLEX) 2 MG tablet Take 2 mg by mouth every evening.  12/04/17   Ria Bush, MD  traMADol (ULTRAM) 50 MG tablet Take 50 mg by mouth daily.  12/04/17   Ria Bush, MD    Allergies Penicillins  Family History  Problem Relation Age of Onset  . Emphysema Mother   . Heart disease Father        MI  . Stroke Father        first at age 31.  . Stroke Sister   . Diabetes Sister   . Hypertension Brother   . Heart disease Brother        Heart Valve  . Stroke Brother   . Diabetes Brother   . Cancer Brother        esophageal  . Diabetes Brother   . Diabetes Brother   . Colon cancer Neg Hx     Social History Social History   Tobacco Use  . Smoking status: Former Smoker    Packs/day: 0.25    Years: 1.00    Pack years: 0.25    Quit date: 04/08/1976    Years since quitting: 43.2  . Smokeless tobacco: Never Used  . Tobacco comment: pt smokes occasionally "socially"  Substance Use Topics  . Alcohol use: No    Alcohol/week:  1.0 standard drinks    Types: 1 Glasses of wine per week    Comment: occassional  . Drug use: No      Review of Systems Constitutional: No fever/chills Eyes: No visual changes. ENT: No sore throat. Cardiovascular: Positive chest pain Respiratory: Denies shortness of breath.  Pain with breathing Gastrointestinal: No abdominal pain.  No nausea, no  vomiting.  No diarrhea.  No constipation. Genitourinary: Negative for dysuria. Musculoskeletal: Positive back pain Skin: Negative for rash. Neurological: Negative for headaches, focal weakness or numbness. All other ROS negative ____________________________________________   PHYSICAL EXAM:  VITAL SIGNS: ED Triage Vitals  Enc Vitals Group     BP 07/06/19 1415 (!) 196/70     Pulse Rate 07/06/19 1415 (!) 54     Resp 07/06/19 1415 17     Temp 07/06/19 1415 97.8 F (36.6 C)     Temp Source 07/06/19 1415 Oral     SpO2 07/06/19 1415 100 %     Weight 07/06/19 1410 138 lb (62.6 kg)     Height 07/06/19 1410 5\' 1"  (1.549 m)     Head Circumference --      Peak Flow --      Pain Score 07/06/19 1409 8     Pain Loc --      Pain Edu? --      Excl. in [place]? --     Constitutional: Alert and oriented. Well appearing and in no acute distress. Eyes: Conjunctivae are normal. EOMI. Head: Atraumatic. Nose: No congestion/rhinnorhea. Mouth/Throat: Mucous membranes are moist.   Neck: No stridor. Trachea Midline. FROM Cardiovascular: Normal rate, regular rhythm. Grossly normal heart sounds.  Good peripheral circulation.  No chest wall tenderness Respiratory: Normal respiratory effort.  No retractions. Lungs CTAB. Gastrointestinal: Soft and nontender. No distention. No abdominal bruits.  Musculoskeletal: No lower extremity tenderness nor edema.  No joint effusions.  Pain noted over the right shoulder blade worse with palpation. Neurologic:  Normal speech and language. No gross focal neurologic deficits are appreciated.  Skin:  Skin is warm, dry and  intact. No rash noted. Psychiatric: Mood and affect are normal. Speech and behavior are normal. GU: Deferred   ____________________________________________   LABS (all labs ordered are listed, but only abnormal results are displayed)  Labs Reviewed  BASIC METABOLIC PANEL - Abnormal; Notable for the following components:      Result Value   Glucose, Bld 107 (*)    GFR calc non Af Amer 52 (*)    GFR calc Af Amer 60 (*)    All other components within normal limits  CBC - Abnormal; Notable for the following components:   RBC 3.77 (*)    All other components within normal limits  HEPATIC FUNCTION PANEL  LIPASE, BLOOD  TROPONIN I (HIGH SENSITIVITY)  TROPONIN I (HIGH SENSITIVITY)   ____________________________________________   ED ECG REPORT I, Vanessa Croft[place], the attending physician, personally viewed and interpreted this ECG.  EKG is sinus bradycardia rate of 56, no ST elevation, T wave inversion in 2, normal intervals ____________________________________________  RADIOLOGY Robert Bellow, personally viewed and evaluated these images (plain radiographs) as part of my medical decision making, as well as reviewing the written report by the radiologist.  ED MD interpretation: No pneumonia.  Official radiology report(s): DG Chest 2 View  Result Date: 07/06/2019 CLINICAL DATA:  Chest pain. Shortness of breath. EXAM: CHEST - 2 VIEW COMPARISON:  Esophagram 05/20/2017. chest radiograph 09/02/2013 FINDINGS: The cardiomediastinal contours are normal. Atherosclerosis of the thoracic aorta. Moderate retrocardiac hiatal hernia. Stable calcified granuloma at the left lung apex. Mild biapical pleuroparenchymal scarring. Pulmonary vasculature is normal. No consolidation, pleural effusion, or pneumothorax. No acute osseous abnormalities are seen. Scoliotic curvature in the spine. Surgical clips in the right breast. IMPRESSION: 1. No acute findings. 2. Moderate hiatal hernia, possible increase size  from 2019 esophagram.  3.  Aortic Atherosclerosis (ICD10-I70.0). Electronically Signed   By: Keith Rake M.D.   On: 07/06/2019 14:54    ____________________________________________   PROCEDURES  Procedure(s) performed (including Critical Care):  .1-3 Lead EKG Interpretation Performed by: Vanessa [place], MD Authorized by: Vanessa [place], MD     Interpretation: abnormal     ECG rate:  50-60s    ECG rate assessment: bradycardic     Rhythm: sinus rhythm     Ectopy: none     Conduction: normal       ____________________________________________   INITIAL IMPRESSION / ASSESSMENT AND PLAN / ED COURSE   [name] was evaluated in Emergency Department on 07/06/2019 for the symptoms described in the history of present illness. She was evaluated in the context of the global COVID-19 pandemic, which necessitated consideration that the patient might be at risk for infection with the SARS-CoV-2 virus that causes COVID-19. Institutional protocols and algorithms that pertain to the evaluation of patients at risk for COVID-19 are in a state of rapid change based on information released by regulatory bodies including the CDC and federal and state organizations. These policies and algorithms were followed during the patient's care in the ED.    Most Likely DDx:  -MSK (atypical chest pain) given patient's pain is reproducible in her shoulder.  However patient does have significantly new high blood pressures and she is reporting the pain radiates into her back so I think would be reasonable to do a CT scan to make sure no evidence of dissection.  Since patients cannot be following up for angiography will just do a runoff for them.  Per CTS the same amount of contrast anyways.   DDx that was also considered d/t potential to cause harm, but was found less likely based on history and physical (as detailed above): -PNA (no fevers, cough but CXR to evaluate) -PNX (reassured with equal b/l breath  sounds, CXR to evaluate) -Symptomatic anemia (will get H&H) -Pulmonary embolism as no sob at rest, patient is on a blood thinner no hypoxia -Pericarditis no rub on exam, EKG changes or hx to suggest dx -Tamponade (no notable SOB, tachycardic, hypotensive) -Esophageal rupture (no h/o diffuse vomitting/no crepitus)  Reviewed patient's prior blood pressures and she normally runs in the [id number] to [id number] systolic but she has had prior blood pressures in the 180s.  Labs are reassuring.  Initial troponin is negative.  Patient is wanting to leave.  Had a lengthy discussion with patient and daughter at bedside that it is most likely musculoskeletal in nature but given her high blood pressures and her report of this being a new acute pain that I think that getting a CT scan would be best.  She is willing to stay for the CT scan.   Troponins were negative x2.  CT imaging was negative for dissection.  Unable to get right views of the runoff but patient is being followed very closely by vascular and this is more of a chronic issue.  She does not have blue feet to suggest acute ischemia so she can continue to follow-up.  There was some incidental findings that I did discuss with family.  They have a known history of compression fractures.  Denies any new falls.  They want to take MiraLAX for the constipation.  She has no right upper quadrant tenderness to suggest gallbladder pathology and no evidence of infection at this time.  Patient's blood pressures have come down pain is resolved patient is  very much wanting to go home at this time and follow-up with her catheterization with Dr. Fletcher Anon  I discussed the provisional nature of ED diagnosis, the treatment so far, the ongoing plan of care, follow up appointments and return precautions with the patient and any family or support people present. They expressed understanding and agreed with the plan, discharged  home.   _________________________________________   FINAL CLINICAL IMPRESSION(S) / ED DIAGNOSES   Final diagnoses:  Chest pain, unspecified type     MEDICATIONS GIVEN DURING THIS VISIT:  Medications  lidocaine (LIDODERM) 5 % 1 patch (1 patch Transdermal Patch Applied 07/06/19 1709)  morphine 4 MG/ML injection 4 mg (4 mg Intravenous Given 07/06/19 1710)  ondansetron (ZOFRAN) injection 4 mg (4 mg Intravenous Given 07/06/19 1710)  iohexol (OMNIPAQUE) 350 MG/ML injection 75 mL (125 mLs Intravenous Contrast Given 07/06/19 1721)     ED Discharge Orders    None       Note:  This document was prepared using Dragon voice recognition software and may include unintentional dictation errors.   Vanessa [place], MD 07/06/19 Lurline Hare

## 2019-07-07 ENCOUNTER — Telehealth (INDEPENDENT_AMBULATORY_CARE_PROVIDER_SITE_OTHER): Payer: Self-pay

## 2019-07-07 NOTE — Telephone Encounter (Signed)
I attempted to contact the patient and a message was left for a return call. 

## 2019-07-08 ENCOUNTER — Other Ambulatory Visit
Admission: RE | Admit: 2019-07-08 | Discharge: 2019-07-08 | Disposition: A | Discharge status: Home / Self Care | Payer: Medicare Other | Source: Ambulatory Visit | Attending: Physician Assistant | Admitting: Physician Assistant

## 2019-07-08 DIAGNOSIS — Z20822 Contact with and (suspected) exposure to covid-19: Secondary | ICD-10-CM | POA: Diagnosis not present

## 2019-07-08 DIAGNOSIS — Z01812 Encounter for preprocedural laboratory examination: Secondary | ICD-10-CM | POA: Insufficient documentation

## 2019-07-08 NOTE — Telephone Encounter (Signed)
Lvm asking pt/pt's daughter, [name], to call back.  Need to relay Dr. Synthia Innocent message and get update on pt.

## 2019-07-08 NOTE — Telephone Encounter (Signed)
Agree with ER eval. Seen at ER, had reassuring CT negative for dissection. Likely MSK ?shoulder pain.  plz call -how is she feeling today?

## 2019-07-09 LAB — SARS CORONAVIRUS 2 (TAT 6-24 HRS): SARS Coronavirus 2: NEGATIVE

## 2019-07-12 ENCOUNTER — Ambulatory Visit
Admission: RE | Admit: 2019-07-12 | Discharge: 2019-07-12 | Disposition: A | Discharge status: Home / Self Care | Payer: Medicare Other | Attending: Cardiovascular Disease | Admitting: Cardiovascular Disease

## 2019-07-12 ENCOUNTER — Other Ambulatory Visit: Payer: Self-pay

## 2019-07-12 ENCOUNTER — Encounter: Payer: Self-pay | Admitting: Cardiovascular Disease

## 2019-07-12 ENCOUNTER — Encounter
Admission: RE | Disposition: A | Discharge status: Home / Self Care | Payer: Self-pay | Source: Home / Self Care | Attending: Cardiovascular Disease

## 2019-07-12 DIAGNOSIS — Z7901 Long term (current) use of anticoagulants: Secondary | ICD-10-CM | POA: Diagnosis not present

## 2019-07-12 DIAGNOSIS — Z79899 Other long term (current) drug therapy: Secondary | ICD-10-CM | POA: Insufficient documentation

## 2019-07-12 DIAGNOSIS — I110 Hypertensive heart disease with heart failure: Secondary | ICD-10-CM | POA: Insufficient documentation

## 2019-07-12 DIAGNOSIS — Z85828 Personal history of other malignant neoplasm of skin: Secondary | ICD-10-CM | POA: Diagnosis not present

## 2019-07-12 DIAGNOSIS — E785 Hyperlipidemia, unspecified: Secondary | ICD-10-CM | POA: Diagnosis not present

## 2019-07-12 DIAGNOSIS — K449 Diaphragmatic hernia without obstruction or gangrene: Secondary | ICD-10-CM | POA: Diagnosis not present

## 2019-07-12 DIAGNOSIS — I4819 Other persistent atrial fibrillation: Secondary | ICD-10-CM | POA: Insufficient documentation

## 2019-07-12 DIAGNOSIS — I700 Atherosclerosis of aorta: Secondary | ICD-10-CM | POA: Diagnosis not present

## 2019-07-12 DIAGNOSIS — I2510 Atherosclerotic heart disease of native coronary artery without angina pectoris: Secondary | ICD-10-CM | POA: Diagnosis not present

## 2019-07-12 DIAGNOSIS — G2581 Restless legs syndrome: Secondary | ICD-10-CM | POA: Diagnosis not present

## 2019-07-12 DIAGNOSIS — I272 Pulmonary hypertension, unspecified: Secondary | ICD-10-CM | POA: Diagnosis not present

## 2019-07-12 DIAGNOSIS — L97519 Non-pressure chronic ulcer of other part of right foot with unspecified severity: Secondary | ICD-10-CM | POA: Insufficient documentation

## 2019-07-12 DIAGNOSIS — M069 Rheumatoid arthritis, unspecified: Secondary | ICD-10-CM | POA: Insufficient documentation

## 2019-07-12 DIAGNOSIS — R230 Cyanosis: Secondary | ICD-10-CM | POA: Diagnosis not present

## 2019-07-12 DIAGNOSIS — F419 Anxiety disorder, unspecified: Secondary | ICD-10-CM | POA: Insufficient documentation

## 2019-07-12 DIAGNOSIS — I739 Peripheral vascular disease, unspecified: Secondary | ICD-10-CM | POA: Insufficient documentation

## 2019-07-12 DIAGNOSIS — I4892 Unspecified atrial flutter: Secondary | ICD-10-CM | POA: Insufficient documentation

## 2019-07-12 DIAGNOSIS — Z7982 Long term (current) use of aspirin: Secondary | ICD-10-CM | POA: Diagnosis not present

## 2019-07-12 DIAGNOSIS — I5032 Chronic diastolic (congestive) heart failure: Secondary | ICD-10-CM | POA: Insufficient documentation

## 2019-07-12 DIAGNOSIS — R0609 Other forms of dyspnea: Secondary | ICD-10-CM

## 2019-07-12 DIAGNOSIS — K219 Gastro-esophageal reflux disease without esophagitis: Secondary | ICD-10-CM | POA: Insufficient documentation

## 2019-07-12 DIAGNOSIS — R0600 Dyspnea, unspecified: Secondary | ICD-10-CM | POA: Diagnosis present

## 2019-07-12 HISTORY — PX: RIGHT/LEFT HEART CATH AND CORONARY ANGIOGRAPHY: CATH118266

## 2019-07-12 HISTORY — PX: INTRAVASCULAR PRESSURE WIRE/FFR STUDY: CATH118243

## 2019-07-12 SURGERY — RIGHT/LEFT HEART CATH AND CORONARY ANGIOGRAPHY
Anesthesia: Moderate Sedation

## 2019-07-12 MED ORDER — NITROGLYCERIN 1 MG/10 ML FOR IR/CATH LAB
INTRA_ARTERIAL | Status: AC
  Filled 2019-07-12: qty 10

## 2019-07-12 MED ORDER — MIDAZOLAM HCL 2 MG/2ML IJ SOLN
INTRAMUSCULAR | Status: DC | PRN
  Administered 2019-07-12 (×2): 1 mg via INTRAVENOUS

## 2019-07-12 MED ORDER — VERAPAMIL HCL 2.5 MG/ML IV SOLN
INTRAVENOUS | Status: DC | PRN
  Administered 2019-07-12: 2.5 mg via INTRA_ARTERIAL

## 2019-07-12 MED ORDER — ONDANSETRON HCL 4 MG/2ML IJ SOLN: 4.0000 mg | Freq: Four times a day (QID) | INTRAMUSCULAR | Status: DC | PRN

## 2019-07-12 MED ORDER — SODIUM CHLORIDE 0.9 % IV SOLN
INTRAVENOUS | Status: DC
  Administered 2019-07-12: 1000 mL via INTRAVENOUS

## 2019-07-12 MED ORDER — MIDAZOLAM HCL 2 MG/2ML IJ SOLN
INTRAMUSCULAR | Status: AC
  Filled 2019-07-12: qty 2

## 2019-07-12 MED ORDER — SODIUM CHLORIDE 0.9% FLUSH: 3.0000 mL | INTRAVENOUS | Status: DC | PRN

## 2019-07-12 MED ORDER — HEPARIN (PORCINE) IN NACL 1000-0.9 UT/500ML-% IV SOLN
INTRAVENOUS | Status: AC
  Filled 2019-07-12: qty 1000

## 2019-07-12 MED ORDER — ACETAMINOPHEN 325 MG PO TABS: 650.0000 mg | ORAL_TABLET | ORAL | Status: DC | PRN

## 2019-07-12 MED ORDER — HEPARIN (PORCINE) IN NACL 1000-0.9 UT/500ML-% IV SOLN
INTRAVENOUS | Status: DC | PRN
  Administered 2019-07-12: 500 mL

## 2019-07-12 MED ORDER — FENTANYL CITRATE (PF) 100 MCG/2ML IJ SOLN
INTRAMUSCULAR | Status: DC | PRN
  Administered 2019-07-12: 50 ug via INTRAVENOUS
  Administered 2019-07-12 (×2): 25 ug via INTRAVENOUS

## 2019-07-12 MED ORDER — LABETALOL HCL 5 MG/ML IV SOLN: 10.0000 mg | INTRAVENOUS | Status: DC | PRN

## 2019-07-12 MED ORDER — SODIUM CHLORIDE 0.9% FLUSH: 3.0000 mL | Freq: Two times a day (BID) | INTRAVENOUS | Status: DC

## 2019-07-12 MED ORDER — SODIUM CHLORIDE 0.9 % IV SOLN: 250.0000 mL | INTRAVENOUS | Status: DC | PRN

## 2019-07-12 MED ORDER — ASPIRIN 81 MG PO CHEW: 81.0000 mg | CHEWABLE_TABLET | ORAL | Status: DC

## 2019-07-12 MED ORDER — IOHEXOL 300 MG/ML  SOLN
INTRAMUSCULAR | Status: DC | PRN
  Administered 2019-07-12: 11:00:00 70 mL

## 2019-07-12 MED ORDER — FENTANYL CITRATE (PF) 100 MCG/2ML IJ SOLN
INTRAMUSCULAR | Status: AC
  Filled 2019-07-12: qty 2

## 2019-07-12 MED ORDER — VERAPAMIL HCL 2.5 MG/ML IV SOLN
INTRAVENOUS | Status: AC
  Filled 2019-07-12: qty 2

## 2019-07-12 MED ORDER — HEPARIN SODIUM (PORCINE) 1000 UNIT/ML IJ SOLN
INTRAMUSCULAR | Status: AC
  Filled 2019-07-12: qty 1

## 2019-07-12 MED ORDER — SODIUM CHLORIDE 0.9 % IV SOLN: INTRAVENOUS | Status: AC

## 2019-07-12 MED ORDER — HEPARIN SODIUM (PORCINE) 1000 UNIT/ML IJ SOLN
INTRAMUSCULAR | Status: DC | PRN
  Administered 2019-07-12: 3000 [IU] via INTRAVENOUS
  Administered 2019-07-12: 3500 [IU] via INTRAVENOUS

## 2019-07-12 SURGICAL SUPPLY — 15 items
CATH BALLN WEDGE 5F 110CM (CATHETERS) ×1 IMPLANT
CATH INFINITI 5FR JK (CATHETERS) ×1 IMPLANT
CATH INFINITI JR4 5F (CATHETERS) ×1 IMPLANT
CATH LAUNCHER 5F JR4 (CATHETERS) ×1 IMPLANT
DEVICE INFLAT 30 PLUS (MISCELLANEOUS) ×1 IMPLANT
DEVICE RAD TR BAND REGULAR (VASCULAR PRODUCTS) ×1 IMPLANT
GLIDESHEATH SLEND SS 6F .021 (SHEATH) ×1 IMPLANT
GUIDEWIRE .025 260CM (WIRE) ×1 IMPLANT
GUIDEWIRE INQWIRE 1.5J.035X260 (WIRE) IMPLANT
GUIDEWIRE PRESS OMNI 185 ST (WIRE) ×1 IMPLANT
INQWIRE 1.5J .035X260CM (WIRE) ×3
KIT MANI 3VAL PERCEP (MISCELLANEOUS) ×3 IMPLANT
PACK CARDIAC CATH (CUSTOM PROCEDURE TRAY) ×3 IMPLANT
SHEATH GLIDE SLENDER 4/5FR (SHEATH) ×1 IMPLANT
WIRE HITORQ VERSACORE ST 145CM (WIRE) ×1 IMPLANT

## 2019-07-12 NOTE — Telephone Encounter (Signed)
Lvm asking pt/pt's daughter, [name], to call back.  Need to relay Dr. Synthia Innocent message and get update on pt.

## 2019-07-12 NOTE — Interval H&P Note (Signed)
Cath Lab Visit (complete for each Cath Lab visit)  Clinical Evaluation Leading to the Procedure:   ACS: No.  Non-ACS:    Anginal Classification: CCS III  Anti-ischemic medical therapy: Minimal Therapy (1 class of medications)  Non-Invasive Test Results: No non-invasive testing performed  Prior CABG: No previous CABG      History and Physical Interval Note:  07/12/2019 10:20 AM  [name]  has presented today for surgery, with the diagnosis of RT and LT cath   Dyspnea on exertion.  The various methods of treatment have been discussed with the patient and family. After consideration of risks, benefits and other options for treatment, the patient has consented to  Procedure(s): RIGHT/LEFT HEART CATH AND CORONARY ANGIOGRAPHY (Bilateral) as a surgical intervention.  The patient's history has been reviewed, patient examined, no change in status, stable for surgery.  I have reviewed the patient's chart and labs.  Questions were answered to the patient's satisfaction.     [name]

## 2019-07-13 ENCOUNTER — Encounter: Payer: Self-pay | Admitting: Cardiology

## 2019-07-13 ENCOUNTER — Ambulatory Visit: Payer: Medicare Other

## 2019-07-13 DIAGNOSIS — I2510 Atherosclerotic heart disease of native coronary artery without angina pectoris: Secondary | ICD-10-CM | POA: Insufficient documentation

## 2019-07-13 NOTE — Telephone Encounter (Signed)
Spoke with pt/pt's daughter, [name], asking how pt is doing.  Pt had heart catheretization yesterday.  Says she is doing well but the procedure was hard on her.  Pt says it's been awhile sinces she has felt this good but really sore.

## 2019-07-13 NOTE — Telephone Encounter (Signed)
Noted thank you

## 2019-07-14 ENCOUNTER — Ambulatory Visit (INDEPENDENT_AMBULATORY_CARE_PROVIDER_SITE_OTHER): Payer: Medicare Other | Admitting: Nurse Practitioner

## 2019-07-14 ENCOUNTER — Encounter (INDEPENDENT_AMBULATORY_CARE_PROVIDER_SITE_OTHER): Payer: Medicare Other

## 2019-07-15 ENCOUNTER — Other Ambulatory Visit
Admission: RE | Admit: 2019-07-15 | Discharge: 2019-07-15 | Disposition: A | Discharge status: Home / Self Care | Payer: Medicare Other | Source: Ambulatory Visit | Attending: Vascular Surgery | Admitting: Vascular Surgery

## 2019-07-15 ENCOUNTER — Other Ambulatory Visit: Payer: Self-pay

## 2019-07-15 DIAGNOSIS — Z20822 Contact with and (suspected) exposure to covid-19: Secondary | ICD-10-CM | POA: Insufficient documentation

## 2019-07-15 DIAGNOSIS — Z01812 Encounter for preprocedural laboratory examination: Secondary | ICD-10-CM | POA: Diagnosis not present

## 2019-07-15 LAB — SARS CORONAVIRUS 2 (TAT 6-24 HRS): SARS Coronavirus 2: NEGATIVE

## 2019-07-19 ENCOUNTER — Other Ambulatory Visit: Payer: Self-pay | Admitting: Cardiovascular Disease

## 2019-07-19 ENCOUNTER — Other Ambulatory Visit: Payer: Self-pay | Admitting: Family Medicine

## 2019-07-19 ENCOUNTER — Other Ambulatory Visit (INDEPENDENT_AMBULATORY_CARE_PROVIDER_SITE_OTHER): Payer: Self-pay | Admitting: Nurse Practitioner

## 2019-07-20 ENCOUNTER — Telehealth (INDEPENDENT_AMBULATORY_CARE_PROVIDER_SITE_OTHER): Payer: Self-pay

## 2019-07-20 NOTE — Telephone Encounter (Signed)
Spoke with the patient and she is now rescheduled to 08/02/19 with Dr. Lucky Cowboy for a RLE angio with a 6:45 am arrival time to the [facility]. Patient will do covid testing on 07/29/19 between 8-1 pm at the [facility]. Pre-procedure instructions were discussed with the patient and will be mailed.

## 2019-07-21 ENCOUNTER — Telehealth: Payer: Self-pay

## 2019-07-21 MED ORDER — POTASSIUM CHLORIDE CRYS ER 20 MEQ PO TBCR: 10.0000 meq | EXTENDED_RELEASE_TABLET | Freq: Two times a day (BID) | ORAL | 6 refills | Status: DC

## 2019-07-21 NOTE — Telephone Encounter (Signed)
Ok to do this - I have sent KDur 86mEq to pharmacy to take 1 tab bid (may cut in half and give both halves).

## 2019-07-21 NOTE — Telephone Encounter (Signed)
Spoke with pt's daughter, [name], relaying Dr. Synthia Innocent message.  Verbalizes understanding and expresses her thanks.

## 2019-07-21 NOTE — Telephone Encounter (Signed)
[name], pt's daughter (DPR signed) said that pt will not take the K CL in packet form because it taste so bad no matter what liquid it is put in. [name] said last rx for K CL tab was 04/16/2019 by Dr Fletcher Anon for K CL ER 10 meq taking two tabs once daily. [name] has been cutting these tabs in half. [name] wants to know if Dr Darnell Level could possibly prescribe K CL 20 meq taking one tab daily and [name] would cut the tab in half and give pt the two halves. [employer]. Pt had annual on 05/05/19 and pt has 3 mth FU scheduled for 08/03/19 with Dr Francetta Found request cb after Dr Darnell Level reviews this note.

## 2019-07-27 ENCOUNTER — Ambulatory Visit (INDEPENDENT_AMBULATORY_CARE_PROVIDER_SITE_OTHER): Payer: Medicare Other | Admitting: Nurse Practitioner

## 2019-07-27 ENCOUNTER — Encounter (INDEPENDENT_AMBULATORY_CARE_PROVIDER_SITE_OTHER): Payer: Medicare Other

## 2019-07-27 ENCOUNTER — Ambulatory Visit (INDEPENDENT_AMBULATORY_CARE_PROVIDER_SITE_OTHER): Payer: Medicare Other | Admitting: Vascular Surgery

## 2019-07-28 ENCOUNTER — Encounter: Payer: Self-pay | Admitting: Physician Assistant

## 2019-07-28 ENCOUNTER — Other Ambulatory Visit: Payer: Self-pay

## 2019-07-28 ENCOUNTER — Ambulatory Visit: Payer: Medicare Other | Admitting: Physician Assistant

## 2019-07-28 VITALS — BP 110/66 | HR 69 | Ht 62.0 in | Wt 139.0 lb

## 2019-07-28 DIAGNOSIS — R0789 Other chest pain: Secondary | ICD-10-CM

## 2019-07-28 DIAGNOSIS — I4819 Other persistent atrial fibrillation: Secondary | ICD-10-CM

## 2019-07-28 DIAGNOSIS — I272 Pulmonary hypertension, unspecified: Secondary | ICD-10-CM | POA: Diagnosis not present

## 2019-07-28 DIAGNOSIS — I10 Essential (primary) hypertension: Secondary | ICD-10-CM | POA: Diagnosis not present

## 2019-07-28 DIAGNOSIS — I5032 Chronic diastolic (congestive) heart failure: Secondary | ICD-10-CM

## 2019-07-28 DIAGNOSIS — R0609 Other forms of dyspnea: Secondary | ICD-10-CM

## 2019-07-28 DIAGNOSIS — R5383 Other fatigue: Secondary | ICD-10-CM

## 2019-07-28 DIAGNOSIS — I2510 Atherosclerotic heart disease of native coronary artery without angina pectoris: Secondary | ICD-10-CM | POA: Diagnosis not present

## 2019-07-28 DIAGNOSIS — R0989 Other specified symptoms and signs involving the circulatory and respiratory systems: Secondary | ICD-10-CM

## 2019-07-28 DIAGNOSIS — R230 Cyanosis: Secondary | ICD-10-CM

## 2019-07-28 DIAGNOSIS — R0600 Dyspnea, unspecified: Secondary | ICD-10-CM

## 2019-07-28 DIAGNOSIS — L989 Disorder of the skin and subcutaneous tissue, unspecified: Secondary | ICD-10-CM

## 2019-07-28 DIAGNOSIS — E785 Hyperlipidemia, unspecified: Secondary | ICD-10-CM

## 2019-07-28 DIAGNOSIS — R0602 Shortness of breath: Secondary | ICD-10-CM

## 2019-07-28 NOTE — Progress Notes (Signed)
Office Visit    Patient Name: [name] Date of Encounter: 07/28/2019  Primary Care Provider:  Ria Bush, MD Primary Cardiologist:  [name], MD  Chief Complaint    Chief Complaint  Patient presents with  . office visit    F/U after cardiac cath; Meds verbally reviewed with patient.    86 year old female with history of persistent atrial flutter/  Fibrillation on reduced dose Eliquis 2.5 mg twice daily (see below regarding reduced dosing), HFpEF, pulmonary hypertension, MR/TR/AR, essential hypertension, hyperlipidemia, PAD with recent angiography of tibial disease and underwent angioplasty on bilateral tibial vessels, chronic fatigue, recent 3/30 CT showing vertebral body compression fractures at T5 and L1, and here for follow-up after cardiac catheterization.  Past Medical History    Past Medical History:  Diagnosis Date  . Abdominal aortic atherosclerosis ([place]) 09/2015   by xray  . Anxiety   . BCC (basal cell carcinoma of skin) 05/2011   on nose  . Breast lesion    a. diagnosed with complex sclerosing lesion with calcifications of the right breast in 08/2016  . Chest pain    a. nuclear stress test 03/2012: normal; b. 09/2018 MV: EF >65%, no isch/scar.   . DDD (degenerative disc disease), lumbosacral 12/06/1992  . Gallstones 09/2015   incidentally by xray  . GERD (gastroesophageal reflux disease) 05/1999  . HERPES ZOSTER 04/13/2007   Qualifier: Diagnosis of  By: [name], [name]   . Hiatal hernia   . History of shingles    ophthalmic, takes acyclovir daily  . Hyperlipidemia 12/2003  . Hypertension 1990  . Mitral regurgitation    a. echo 03/2012: EF 60-65%, no RWMA, mild to mod AI/MR, mod TR, PASP 37 mmHg  . Osteoarthritis 08/21/1989  . Osteoporosis with fracture 11/1997   with compression fracture T12  . Persistent atrial fibrillation ([place]) 03/16/2012   a. CHADS2VASc => 5 (HTN, age x 2, vascular disease, sex category)-->Eliquis 5 BID;  b.  Recurrent AF in 06/2016;  c. 01/2017 s/p DCCV;  d. 02/2017 recurrent AFib->Amio added->DCCV; e. 03/2017 recurrent AFib, s/p reload of amio and DCCV 04/02/2017  . Rheumatoid arthritis ([place])   . RLS (restless legs syndrome)    Past Surgical History:  Procedure Laterality Date  . ABDOMINAL HYSTERECTOMY  Age 70 - 35   S/P TAH  . abdominal ultrasound  04/23/2004   Gallstones  . BREAST EXCISIONAL BIOPSY Right 2018   neg/benign complex sclerosing lesion  . BREAST LUMPECTOMY WITH RADIOACTIVE SEED LOCALIZATION Right 10/2016   breast lumpectomy with radioactive seed localization and margin assessment for complex sclerosing lesion [facility])  . BREAST LUMPECTOMY WITH RADIOACTIVE SEED LOCALIZATION Right 11/05/2016   Procedure: RIGHT BREAST LUMPECTOMY WITH RADIOACTIVE SEED LOCALIZATION;  Surgeon: Fanny Skates, MD;  Location: [place];  Service: General;  Laterality: Right;  . CARDIAC CATHETERIZATION    . CARDIOVERSION N/A 01/31/2017   Procedure: CARDIOVERSION;  Surgeon: Wellington Hampshire, MD;  Location: [facility];  Service: Cardiovascular;  Laterality: N/A;  . CARDIOVERSION N/A 03/03/2017   Procedure: CARDIOVERSION;  Surgeon: Wellington Hampshire, MD;  Location: [facility];  Service: Cardiovascular;  Laterality: N/A;  . CARDIOVERSION N/A 04/02/2017   Procedure: CARDIOVERSION;  Surgeon: Wellington Hampshire, MD;  Location: [facility];  Service: Cardiovascular;  Laterality: N/A;  . CARDIOVERSION N/A 01/16/2018   Procedure: CARDIOVERSION;  Surgeon: Wellington Hampshire, MD;  Location: [facility];  Service: Cardiovascular;  Laterality: N/A;  . CATARACT EXTRACTION  05/2010  left eye  . CERVICAL DISCECTOMY  1992   Fusion (Dr. Joya Salm)  . ESI Bilateral 01/2016   S1 transforaminal ESI x3  . ESOPHAGOGASTRODUODENOSCOPY  01/01/2002   with ulcer, bx. neg; + stricture gastric ulcer with hemorrhage  . ESOPHAGOGASTRODUODENOSCOPY  04/23/2004   Esophageal stricture -- dilated  . INTRAVASCULAR PRESSURE  WIRE/FFR STUDY N/A 07/12/2019   Procedure: INTRAVASCULAR PRESSURE WIRE/FFR STUDY;  Surgeon: Wellington Hampshire, MD;  Location: [place] [facility];  Service: Cardiovascular;  Laterality: N/A;  . LOWER EXTREMITY ANGIOGRAPHY Left 02/01/2019   LOWER EXTREMITY ANGIOGRAPHY;  Surgeon: Algernon Huxley, MD  . LOWER EXTREMITY ANGIOGRAPHY Right 03/25/2019   Procedure: LOWER EXTREMITY ANGIOGRAPHY;  Surgeon: Algernon Huxley, MD;  Location: [place] [facility];  Service: Cardiovascular;  Laterality: Right;  . nuclear stress test  03/2012   no ischemia  . PERIPHERAL VASCULAR BALLOON ANGIOPLASTY Left 01/2019   Percutaneous transluminal angioplasty of left anterior and posterior tibial artery with 2.5 mm diameter by 22 cm length angioplasty balloon ([place])  . RIGHT/LEFT HEART CATH AND CORONARY ANGIOGRAPHY Bilateral 07/12/2019   Procedure: RIGHT/LEFT HEART CATH AND CORONARY ANGIOGRAPHY;  Surgeon: Wellington Hampshire, MD;  Location: [place] [facility];  Service: Cardiovascular;  Laterality: Bilateral;  . SKIN CANCER EXCISION  05/20/2011   BCC from nose  . US ECHOCARDIOGRAPHY  03/2012   in flutter, EF 60%, mild-mod aortic, mitral, tricuspid regurg, mildly dilated LA    Allergies  Allergies  Allergen Reactions  . Penicillins Rash and Other (See Comments)    Childhood Allergy Has patient had a PCN reaction causing immediate rash, facial/tongue/throat swelling, SOB or lightheadedness with hypotension: Yes Has patient had a PCN reaction causing severe rash involving mucus membranes or skin necrosis: Unknown Has patient had a PCN reaction that required hospitalization: No Has patient had a PCN reaction occurring within the last 10 years: No If all of the above answers are "NO", then may proceed with Cephalosporin use.     History of Present Illness    [name] is a 86 y.o. female with PMH as above. She has a history of hearing loss.  She has a known history of persistent atrial fibrillation and flutter  maintaining sinus rhythm with amiodarone, chronic dyspnea and fatigue, essential hypertension, hyperlipidemia, PAD, and rheumatoid arthritis.  Echo in 2019 showed normal LVSF and moderate pulmonary hypertension.  Lexiscan June 2020 showed normal LVSF without ischemia or scar.  She underwent angioplasty on bilateral tibial vessels with improvement in symptoms.  Repeat echo for DOE showed EF 60 to 123456, grade 2 diastolic dysfunction, moderate mitral regurgitation/tricuspid regurgitation, mild to moderate aortic regurgitation, and severe pulmonary hypertension with estimated pulmonary pressure between 60 and 65 mmHg.  When seen by her primary cardiologist in office, recommendations were to decrease amiodarone to 100 mg daily, increase lisinopril to 10 mg for elevated blood pressure, and increase to Lasix 40 mg daily due to her continued exertional dyspnea with moderate pulmonary hypertension.  She was continued on reduced dose Eliquis 2.5 mg twice daily.   On 3/26, she presented to clinic with ongoing and progressive DOE/fatigue, as well as concern for worsening PAD.  She became significantly short of breath when she attempted to stand during her visit and was unable to place weight on her feet and maintain balance.  She had recently undergone a procedure on her R foot by a podiatrist (shaved off some of the bone of the R foot), which was not healing with ulcer  noted. Her left ankle was swollen with previous diagnosis of left ankle cellulitis. [facility] appointment was recommended, given following her recent revascularization by [facility], she initially had pink feet that were warm at night but had since turned cyanotic and cold (with continued numbness and weakness).  She had pain in both feet that was worse in the dependent position. She was scheduled for right and left heart cardiac catheterization.    It was noted that we should reassess her dose of Eliquis after catheterization, given she no longer meeting criteria for  reduced dosing.  Also noted was that her lisinopril should be increased at her next appointment, if BP still elevated.  She was referred to vein and vascular surgery for a decision regarding lower extremity angiography.  She was also referred to pulmonology for PFTs.  She underwent right and left heart cardiac catheterization 07/12/2019 that showed mild to moderate nonobstructive CAD.  The worst stenosis was in the ostium of the RCA, which was calcified and eccentric.  Right heart cardiac catheterization showed mildly elevated left sided filling pressure with PCWP 15 mmHg and mild pulmonary hypertension at 41/13 mmHg with low normal cardiac output at 4.02 and cardiac index 2.48.  Pulmonary vascular resistance was 2.74 Woods unit.  Refer medical therapy for CAD.  She was continued on her same dose of furosemide.  She is scheduled for lower angiography.  She RTC today and reports she is doing well from a cardiac standpoint.  She reports her right radial arteriotomy site is healing well and without any signs of swelling or infection.  She does note that her right arm was bruised for 2 weeks following her cardiac catheterization, having only recently resolved.  She a right upper arm collection of what she suspected was blood and hematoma.  After her most recent visit, she noted chest pain and pain that occurred between her shoulder blades. Today, she notes this pain is improved and only occurs with certain positions and not with deeper breathing.  She notes that the pain is mainly between her shoulder blades and no longer in her chest.  She wonders if this pain may be 2/2 a muscle strain, given it occurred after she reached in a funny way to grab something right before it occurred.  She states that her shortness of breath is stable, as well as her DOE with minimal activity.  She notes right lower extremity pain, worse along the medial aspect of her right calf.  She reports his pain is pinpoint pain and at times  excruciating.  She continues to note discoloration and pain in her lower extremities.  No racing heart rate or palpitations.  No presyncope or syncope.  No recent falls.  No orthopnea, abdominal distention, or increased lower extremity edema.  No early satiety.  She is eager for resolution of her lower extremity discoloration and pain.  Home Medications    Prior to Admission medications   Medication Sig Start Date End Date Taking? Authorizing Provider  acetaminophen (TYLENOL) 500 MG tablet Take 1,000 mg every 8 (eight) hours as needed by mouth for moderate pain.     [provider]  acyclovir (ZOVIRAX) 400 MG tablet Take 400 mg 2 (two) times daily by mouth.     [provider]  amiodarone (PACERONE) 100 MG tablet Take 1 tablet (100 mg total) by mouth daily. Patient taking differently: Take 100 mg by mouth in the morning and at bedtime.  05/28/19   Wellington Hampshire, MD  aspirin  EC 81 MG tablet Take 1 tablet (81 mg total) by mouth daily. 02/01/19   Algernon Huxley, MD  atorvastatin (LIPITOR) 20 MG tablet TAKE 1 TABLET BY MOUTH ONCE DAILY 07/19/19   Ria Bush, MD  DULoxetine (CYMBALTA) 60 MG capsule TAKE 1 CAPSULE BY MOUTH ONCE DAILY 07/19/19   Ria Bush, MD  ELIQUIS 2.5 MG TABS tablet TAKE ONE TABLET BY MOUTH TWICE DAILY Patient taking differently: Take 2.5 mg by mouth 2 (two) times daily.  01/13/19   Wellington Hampshire, MD  ergocalciferol (VITAMIN D2) 1.25 MG (50000 UT) capsule Take 1 capsule (50,000 Units total) by mouth once a week. Patient taking differently: Take 50,000 Units by mouth once a week. Saturday 05/17/19   Ria Bush, MD  furosemide (LASIX) 40 MG tablet Take 1 tablet (40 mg total) by mouth daily. 05/28/19   Wellington Hampshire, MD  gabapentin (NEURONTIN) 300 MG capsule Take 1 capsule (300 mg total) by mouth at bedtime. 05/05/19   Ria Bush, MD  lisinopril (ZESTRIL) 10 MG tablet Take 1 tablet (10 mg total) by mouth daily. 05/28/19   Wellington Hampshire, MD  metoprolol tartrate (LOPRESSOR) 25 MG tablet Take 0.5 tablets (12.5 mg total) by mouth 2 (two) times daily. 08/13/18   Wellington Hampshire, MD  pantoprazole (PROTONIX) 40 MG tablet TAKE 1 TABLET BY MOUTH EVERY DAY 07/19/19   Ria Bush, MD  potassium chloride SA (KLOR-CON) 20 MEQ tablet Take 0.5 tablets (10 mEq total) by mouth 2 (two) times daily. 07/21/19   Ria Bush, MD  prednisoLONE acetate (PRED FORTE) 1 % ophthalmic suspension Place 1 drop into the left eye daily.     [provider]  rOPINIRole (REQUIP) 1 MG tablet TAKE 2 TABLETS BY MOUTH AT BEDTIME Patient taking differently: Take 2 mg by mouth at bedtime.  06/14/19   Ria Bush, MD  tiZANidine (ZANAFLEX) 2 MG tablet Take 2 mg by mouth every evening.  12/04/17   Ria Bush, MD  traMADol (ULTRAM) 50 MG tablet Take 50 mg by mouth daily.  12/04/17   Ria Bush, MD    Review of Systems    She denies palpitations, pnd, orthopnea, n, v, dizziness, syncope, weight gain, or early satiety.  She reports resolving chest pain and continued but improving pain between her shoulder blades, which she suspects may be due to pulling a muscle the other week.  This pain is reportedly positional and no longer worse with deeper breathing.  She will continue to monitor it.  She notes stable/chronic DOE and shortness of breath. She reports stable LEE with discoloration and pain as outlined above.   All other systems reviewed and are otherwise negative except as noted above.  Physical Exam    VS:  BP 110/66 (BP Location: Left Arm, Patient Position: Sitting, Cuff Size: Normal)   Pulse 69   Ht 5\' 2"  (1.575 m)   Wt 139 lb (63 kg)   SpO2 97%   BMI 25.42 kg/m  , BMI Body mass index is 25.42 kg/m. GEN: Well nourished, well developed, in no acute distress. Seated in wheelchair.  HEENT: normal. Neck: Supple, no JVD, carotid bruits, or masses. Cardiac: RRR, 2/6 systolic murmur in RUSB. No rubs, or gallops. No clubbing.  Bilateral lower extremity / pedal cyanosis noted and worse in the R foot than left. R foot wound along the lateral side of the foot is still not yet healed.  Radials 2+ and equal bilaterally. DP/PT unable to be assessed but  blood flow confirmed with doppler.  Respiratory:  Respirations regular and unlabored, clear to auscultation bilaterally. GI: Soft, nontender, nondistended, BS + x 4. MS: Bilateral lower extremity / pedal cyanosis and mottling / discoloration noted and worse in the R foot worse than left. R foot wound / non-healing ulcer along the lateral side of the foot. Reduced pedal pulses confirmed with doppler.  Skin: warm and dry, no rash. Neuro:  Strength and sensation are intact. Psych: Normal affect.  Accessory Clinical Findings    ECG personally reviewed by me today - NSR, 69bpm,  Previous inferior infarct versus lead placement not new when compared with prior tracings - no acute changes.  VITALS Reviewed today   Temp Readings from Last 3 Encounters:  07/12/19 98.4 F (36.9 C) (Oral)  07/06/19 97.8 F (36.6 C) (Oral)  05/05/19 97.6 F (36.4 C) (Temporal)   BP Readings from Last 3 Encounters:  07/28/19 110/66  07/12/19 (!) 152/64  07/06/19 (!) 150/59   Pulse Readings from Last 3 Encounters:  07/28/19 69  07/12/19 (!) 53  07/06/19 (!) 54    Wt Readings from Last 3 Encounters:  07/28/19 139 lb (63 kg)  07/12/19 139 lb (63 kg)  07/06/19 138 lb (62.6 kg)     LABS  reviewed today   CareEverwhere Labs present and most recent? Yes/No: No  Lab Results  Component Value Date   WBC 5.8 07/06/2019   HGB 12.4 07/06/2019   HCT 37.3 07/06/2019   MCV 98.9 07/06/2019   PLT 242 07/06/2019   Lab Results  Component Value Date   CREATININE 0.98 07/06/2019   BUN 17 07/06/2019   NA 141 07/06/2019   K 3.6 07/06/2019   CL 101 07/06/2019   CO2 30 07/06/2019   Lab Results  Component Value Date   ALT 17 07/06/2019   AST 19 07/06/2019   ALKPHOS 42 07/06/2019   BILITOT  0.6 07/06/2019   Lab Results  Component Value Date   CHOL 134 04/28/2019   HDL 67.20 04/28/2019   LDLCALC 50 04/28/2019   LDLDIRECT 173.1 02/19/2010   TRIG 85.0 04/28/2019   CHOLHDL 2 04/28/2019    No results found for: HGBA1C Lab Results  Component Value Date   TSH 2.30 08/03/2018     STUDIES/PROCEDURES reviewed today   [facility] 07/12/19  Ost LAD lesion is 40% stenosed.  Mid LAD lesion is 30% stenosed.  The left ventricular systolic function is normal.  LV end diastolic pressure is mildly elevated.  The left ventricular ejection fraction is 55-65% by visual estimate.  Ost RCA to Prox RCA lesion is 50% stenosed 1.  Mild to moderate nonobstructive coronary artery disease.  The worst stenosis was 50% in the ostium of the right coronary artery which was calcified and eccentric.  Fractional flow reserve was done on this lesion and was not significant with an iFR ratio of 0.98. 2.  Normal LV systolic function. 3.  Right heart catheterization showed mildly elevated left-sided filling pressure with pulmonary capillary wedge pressure 15 mmHg, mild pulmonary hypertension at 41 over 13 mmHg and low normal cardiac output at 4.02 with a cardiac index of 2.48.  Pulmonary vascular resistance is 2.74 Woods unit Recommendations: Recommend medical therapy for nonobstructive coronary artery disease. Volume status appears reasonable and recommend continuing same dose of furosemide. The patient seems to be reasonably optimized from a cardiac standpoint. Avoid catheterization via the right radial artery in the future if needed given small vessel diameter which is diffusely diseased.  3/30 CT IMPRESSION: VASCULAR 1. Mild atherosclerotic disease throughout the aorta without evidence for an aortic aneurysm or dissection. Aortic Atherosclerosis (ICD10-I70.0). 2. No evidence for inflow or outflow disease. 3. Concern for bilateral runoff disease but limited evaluation of the runoff vessels  bilaterally. Minimal flow in the distal runoff vessels but this could be related to technical factors and timing of the study. 4. At least mild stenosis involving the main renal arteries. 5. No evidence for a large or central pulmonary embolism. NON-VASCULAR 1. Vertebral body compression fractures at T5 and L1. Age of these fractures are unknown but the fractures could be associated with the patient's pain. 2. Cholelithiasis with distended gallbladder. No evidence for gallbladder inflammation. 3. Large amount of stool in the colon. 4. Moderate sized hiatal hernia. 5. No acute chest abnormality.  07/06/19 Bilateral ABIs appear essentially unchanged compared to prior study on  04/23/2019. Bilateral TBIs appear essentially unchanged compared to prior  study on 1/15/20212.  Summary:  Right: Resting right ankle-brachial index is within normal range. No  evidence of significant right lower extremity arterial disease. The right  toe-brachial index is abnormal.  Left: Resting left ankle-brachial index is within normal range. No  evidence of significant left lower extremity arterial disease. The left  toe-brachial index is abnormal.  05/28/19 Echo 1. Left ventricular ejection fraction, by estimation, is 60 to 65%. The  left ventricle has normal function. The left ventricle has no regional  wall motion abnormalities. There is mild left ventricular hypertrophy.  Left ventricular diastolic parameters  are consistent with Grade II diastolic dysfunction (pseudonormalization).  2. Right ventricular systolic function is normal. The right ventricular  size is mildly enlarged. mildly increased right ventricular wall  thickness. There is severely elevated pulmonary artery systolic pressure.  3. Left atrial size was mild to moderately dilated.  4. Right atrial size was mildly dilated.  5. Moderate mitral valve regurgitation.  6. Tricuspid valve regurgitation is moderate.  7. Aortic valve  regurgitation is mild to moderate.  8. The inferior vena cava is normal in size with <50% respiratory  variability, suggesting right atrial pressure of 8 mmHg.   NM 09/2018  Normal pharmacologic myocardial perfusion stress test without significant ischemia or scar.  The left ventricular ejection fraction is normal (>65%).  This is a low risk study.  Attenuation correction CT is notable for coronary artery and aortic atherosclerotic calcification, as well as aortic valve and mitral annular calcification.  A moderate-sided hiatal hernia is present.  Hyperdensity of the liver is noted, which can be seen with long-term amiodarone use. Clinical/laboratory correlation recommended.  Splenic calcifications suggest prior granulomatous disease.   Assessment & Plan    Chronic diastolic heart failure, Mild pulmonary hypertension Valvular Dz: Moderate MR/TR and mild to moderate AR DOE, SOB --S/p recent [facility], performed for ongoing SOB/DOE. 07/12/18 cath results as above showed mild to moderate nonobstructive CAD and mildly elevated LVSP and mild pulmonary HTN with recommendation for continued medical management. Most recent echo above with moderate MR/TR and mild to moderate AR. She continues to have dyspnea/SOB with consideration of some element of deconditioning. Also considered is valvular dz and will continue to periodically update echo. She is euvolemic on exam. Continue current dose of lasix 40mg  daily with potasium supplementation 20 mEq daily and lisinopril 10mg  daily. Will recheck a CBC and BMET s/p catheterization. Her R arm ecchymosis and pain is resolved with R arteriotomy site healed and without signs of swelling or infection. Future considerations could include  PFTs if sx of SOB/DOE continued in the future.    Atypical chest pain, suspected as MSK in etiology --As above in HPI. She presented to the ED 3/30 with chest pain that was described as pleuritic in nature and radiating to her  back. At that time, it was noted to be severe and constant. It was worse with position changes. It was thought this pain was most likely MSK, given it was reproducible. CT scan was performed and noted some vertebral body compression fractures, and per ED documentation, this was discussed with the family without any recent falls reported. No evidence of acute PE or aortic dissection. Also noted on this scan was mild renal artery stenosis. She is s/p recent cath with mild to moderate nonobstructive dz as above and recommendation for continued medical management. Today, she reports that her CP is improved with only mild pain between her shoulder blade that is noted only with certain positions.    Vertebral body compression fractures (T5, L1) --As seen on 3/30 CT scan. Consider as contributing to her recent atypical CP as above. No recent falls.   Essential hypertension --BP well controlled to soft. Continue current lasix and lisinopril.  HLD --Continue current statin.  Most recent LDL 50 and at goal.  Paroxysmal atrial flutter and persistent atrial fibrillation --Maintaining NSR.  She is taking amiodarone 100 mg daily and metoprolol 12.5 mg twice daily.  Continue low-dose Eliquis 2.5 mg daily.  She is borderline for ongoing reduced dose Eliquis as she currently only meets age criteria at 86 years old and is slightly above weight cutoff at 63 kg today. Given her upcoming angiography, will defer from adjustment of Eliquis and reach out to primary cardiologist regarding recommendations for dosing given her borderline status. We discussed holding her Eliquis 2 days before her angiography with restart per vascular surgery. As above, rechecking CBC.  Peripheral arterial disease with cyanosis / cool extremities / pedal pulses undetected but confirmed by echo; nonhealing R foot wound --S/p tibial vessel intervention.  Pedal pulses confirmed with doppler  She has a right foot wound that is not healing and  will see podiatry this week to determine if she needs wound care. She is scheduled for repeat angiography of her lower extremities. As above, she will hold her Eliquis 2 days before this procedure with restart as directed by vascular surgery.  Continue ASA and statin.   Medication changes: None. *Prior to angiography, hold Eliquis for 2 days with restart per vascular surgery. Labs ordered: CBC and BMET s/p cath Studies / Imaging ordered: None Future considerations: Will reach out to primary cardiologist regarding reduced Eliquis dosing, as no longer meets weight criteria for reduced dosing. Recommendations pending primary cardiologist response.  Disposition: RTC 6 months or sooner if needed   Arvil Chaco, PA-C 07/28/2019

## 2019-07-28 NOTE — Patient Instructions (Signed)
Medication Instructions:  Your physician recommends that you continue on your current medications as directed. Please refer to the Current Medication list given to you today.  *If you need a refill on your cardiac medications before your next appointment, please call your pharmacy*   Lab Work: Bmet and Cbc today. If you have labs (blood work) drawn today and your tests are completely normal, you will receive your results only by: Marland Kitchen MyChart Message (if you have MyChart) OR . A paper copy in the mail If you have any lab test that is abnormal or we need to change your treatment, we will call you to review the results.   Testing/Procedures: None ordered   Follow-Up: At [facility], you and your health needs are our priority.  As part of our continuing mission to provide you with exceptional heart care, we have created designated Provider Care Teams.  These Care Teams include your primary Cardiologist (physician) and Advanced Practice Providers (APPs -  Physician Assistants and Nurse Practitioners) who all work together to provide you with the care you need, when you need it.  We recommend signing up for the patient portal called "MyChart".  Sign up information is provided on this After Visit Summary.  MyChart is used to connect with patients for Virtual Visits (Telemedicine).  Patients are able to view lab/test results, encounter notes, upcoming appointments, etc.  Non-urgent messages can be sent to your provider as well.   To learn more about what you can do with MyChart, go to [email or web address].    Your next appointment:   6 month(s)  The format for your next appointment:   In Person  Provider:    You may see [name], MD or one of the following Advanced Practice Providers on your designated Care Team:    Murray Hodgkins, NP  Christell Faith, PA-C  Marrianne Mood, PA-C    Other Instructions N/A

## 2019-07-29 ENCOUNTER — Other Ambulatory Visit
Admission: RE | Admit: 2019-07-29 | Discharge: 2019-07-29 | Disposition: A | Discharge status: Home / Self Care | Payer: Medicare Other | Source: Ambulatory Visit | Attending: Vascular Surgery | Admitting: Vascular Surgery

## 2019-07-29 ENCOUNTER — Ambulatory Visit: Payer: Medicare Other | Admitting: Podiatry

## 2019-07-29 ENCOUNTER — Other Ambulatory Visit: Payer: Self-pay

## 2019-07-29 DIAGNOSIS — Z20822 Contact with and (suspected) exposure to covid-19: Secondary | ICD-10-CM | POA: Insufficient documentation

## 2019-07-29 DIAGNOSIS — L89890 Pressure ulcer of other site, unstageable: Secondary | ICD-10-CM | POA: Diagnosis not present

## 2019-07-29 DIAGNOSIS — I739 Peripheral vascular disease, unspecified: Secondary | ICD-10-CM | POA: Diagnosis not present

## 2019-07-29 DIAGNOSIS — M79676 Pain in unspecified toe(s): Secondary | ICD-10-CM

## 2019-07-29 DIAGNOSIS — Z01812 Encounter for preprocedural laboratory examination: Secondary | ICD-10-CM | POA: Insufficient documentation

## 2019-07-29 DIAGNOSIS — L89899 Pressure ulcer of other site, unspecified stage: Secondary | ICD-10-CM

## 2019-07-29 LAB — CBC WITH DIFFERENTIAL/PLATELET
Basophils Absolute: 0.1 10*3/uL (ref 0.0–0.2)
Basos: 2 %
EOS (ABSOLUTE): 0.2 10*3/uL (ref 0.0–0.4)
Eos: 3 %
Hematocrit: 39.2 % (ref 34.0–46.6)
Hemoglobin: 12.9 g/dL (ref 11.1–15.9)
Immature Grans (Abs): 0 10*3/uL (ref 0.0–0.1)
Immature Granulocytes: 1 %
Lymphocytes Absolute: 1.4 10*3/uL (ref 0.7–3.1)
Lymphs: 21 %
MCH: 31.9 pg (ref 26.6–33.0)
MCHC: 32.9 g/dL (ref 31.5–35.7)
MCV: 97 fL (ref 79–97)
Monocytes Absolute: 0.7 10*3/uL (ref 0.1–0.9)
Monocytes: 11 %
Neutrophils Absolute: 4.2 10*3/uL (ref 1.4–7.0)
Neutrophils: 62 %
Platelets: 281 10*3/uL (ref 150–450)
RBC: 4.04 x10E6/uL (ref 3.77–5.28)
RDW: 12.5 % (ref 11.7–15.4)
WBC: 6.6 10*3/uL (ref 3.4–10.8)

## 2019-07-29 LAB — BASIC METABOLIC PANEL WITH GFR
BUN/Creatinine Ratio: 14 (ref 12–28)
BUN: 18 mg/dL (ref 8–27)
CO2: 22 mmol/L (ref 20–29)
Calcium: 9.3 mg/dL (ref 8.7–10.3)
Chloride: 104 mmol/L (ref 96–106)
Creatinine, Ser: 1.25 mg/dL — ABNORMAL HIGH (ref 0.57–1.00)
GFR calc Af Amer: 44 mL/min/1.73 — ABNORMAL LOW
GFR calc non Af Amer: 38 mL/min/1.73 — ABNORMAL LOW
Glucose: 96 mg/dL (ref 65–99)
Potassium: 4.3 mmol/L (ref 3.5–5.2)
Sodium: 141 mmol/L (ref 134–144)

## 2019-07-29 LAB — SARS CORONAVIRUS 2 (TAT 6-24 HRS): SARS Coronavirus 2: NEGATIVE

## 2019-07-30 ENCOUNTER — Encounter: Payer: Self-pay | Admitting: Podiatry

## 2019-07-30 NOTE — Progress Notes (Signed)
Subjective:  Patient ID: [name], female    DOB: 1929/06/22,  MRN: [id number]  Chief Complaint  Patient presents with  . Callouses    pt is here for a painful callus that is not healing correctly, pt has a hard time walking on it. Pt has tried bandaids, but not much has helped it.    86 y.o. female presents for wound care.  Patient presents with right lateral fifth metatarsal phalangeal joint wound.  Patient states this has been going on for 2 months.  She states that last time she was seen me she had a callus formation after that she started developing the wound.  She states that she has a hard time walking because of the pain associated with it.  She has been ambulating with regular sneakers.  She is scheduled for lower extremity angiogram to the right side.  She has had multiple history of doing multiple angiograms in the past.  It just appears that patient does not have no circulation to the right lower extremity.  However she is scheduled for angiogram during next week I will see her back a week after that.  She denies any other acute complaints.   Review of Systems: Negative except as noted in the HPI. Denies N/V/F/Ch.  Past Medical History:  Diagnosis Date  . Abdominal aortic atherosclerosis ([place]) 09/2015   by xray  . Anxiety   . BCC (basal cell carcinoma of skin) 05/2011   on nose  . Breast lesion    a. diagnosed with complex sclerosing lesion with calcifications of the right breast in 08/2016  . Chest pain    a. nuclear stress test 03/2012: normal; b. 09/2018 MV: EF >65%, no isch/scar.   . DDD (degenerative disc disease), lumbosacral 12/06/1992  . Gallstones 09/2015   incidentally by xray  . GERD (gastroesophageal reflux disease) 05/1999  . HERPES ZOSTER 04/13/2007   Qualifier: Diagnosis of  By: [name], [name]   . Hiatal hernia   . History of shingles    ophthalmic, takes acyclovir daily  . Hyperlipidemia 12/2003  . Hypertension 1990  . Mitral  regurgitation    a. echo 03/2012: EF 60-65%, no RWMA, mild to mod AI/MR, mod TR, PASP 37 mmHg  . Osteoarthritis 08/21/1989  . Osteoporosis with fracture 11/1997   with compression fracture T12  . Persistent atrial fibrillation ([place]) 03/16/2012   a. CHADS2VASc => 5 (HTN, age x 2, vascular disease, sex category)-->Eliquis 5 BID;  b. Recurrent AF in 06/2016;  c. 01/2017 s/p DCCV;  d. 02/2017 recurrent AFib->Amio added->DCCV; e. 03/2017 recurrent AFib, s/p reload of amio and DCCV 04/02/2017  . Rheumatoid arthritis ([place])   . RLS (restless legs syndrome)     Current Outpatient Medications:  .  acetaminophen (TYLENOL) 500 MG tablet, Take 1,000 mg every 8 (eight) hours as needed by mouth for moderate pain. , Disp: , Rfl:  .  acyclovir (ZOVIRAX) 400 MG tablet, Take 400 mg 2 (two) times daily by mouth. , Disp: , Rfl:  .  amiodarone (PACERONE) 100 MG tablet, Take 1 tablet (100 mg total) by mouth daily. (Patient taking differently: Take 100 mg by mouth in the morning and at bedtime. ), Disp: , Rfl:  .  aspirin EC 81 MG tablet, Take 1 tablet (81 mg total) by mouth daily., Disp: 150 tablet, Rfl: 2 .  atorvastatin (LIPITOR) 20 MG tablet, TAKE 1 TABLET BY MOUTH ONCE DAILY, Disp: 90 tablet, Rfl: 0 .  DULoxetine (CYMBALTA) 60  MG capsule, TAKE 1 CAPSULE BY MOUTH ONCE DAILY, Disp: 90 capsule, Rfl: 0 .  ELIQUIS 2.5 MG TABS tablet, TAKE ONE TABLET BY MOUTH TWICE DAILY (Patient taking differently: Take 2.5 mg by mouth 2 (two) times daily. ), Disp: 180 tablet, Rfl: 1 .  ergocalciferol (VITAMIN D2) 1.25 MG (50000 UT) capsule, Take 1 capsule (50,000 Units total) by mouth once a week. (Patient taking differently: Take 50,000 Units by mouth once a week. Saturday), Disp: 12 capsule, Rfl: 3 .  furosemide (LASIX) 40 MG tablet, Take 1 tablet (40 mg total) by mouth daily., Disp: 30 tablet, Rfl: 5 .  gabapentin (NEURONTIN) 300 MG capsule, Take 1 capsule (300 mg total) by mouth at bedtime., Disp: , Rfl:  .  lisinopril (ZESTRIL)  10 MG tablet, Take 1 tablet (10 mg total) by mouth daily., Disp: 30 tablet, Rfl: 5 .  metoprolol tartrate (LOPRESSOR) 25 MG tablet, Take 0.5 tablets (12.5 mg total) by mouth 2 (two) times daily., Disp: 90 tablet, Rfl: 2 .  pantoprazole (PROTONIX) 40 MG tablet, TAKE 1 TABLET BY MOUTH EVERY DAY, Disp: 90 tablet, Rfl: 0 .  potassium chloride SA (KLOR-CON) 20 MEQ tablet, Take 0.5 tablets (10 mEq total) by mouth 2 (two) times daily., Disp: 30 tablet, Rfl: 6 .  prednisoLONE acetate (PRED FORTE) 1 % ophthalmic suspension, Place 1 drop into the left eye daily. , Disp: , Rfl:  .  rOPINIRole (REQUIP) 1 MG tablet, TAKE 2 TABLETS BY MOUTH AT BEDTIME (Patient taking differently: Take 2 mg by mouth at bedtime. ), Disp: 180 tablet, Rfl: 3 .  tiZANidine (ZANAFLEX) 2 MG tablet, Take 2 mg by mouth every evening. , Disp: , Rfl:  .  traMADol (ULTRAM) 50 MG tablet, Take 50 mg by mouth daily. , Disp: , Rfl: 0  Social History   Tobacco Use  Smoking Status Former Smoker  . Packs/day: 0.25  . Years: 1.00  . Pack years: 0.25  . Quit date: 04/08/1976  . Years since quitting: 43.3  Smokeless Tobacco Never Used  Tobacco Comment   pt smokes occasionally "socially"    Allergies  Allergen Reactions  . Penicillins Rash and Other (See Comments)    Childhood Allergy Has patient had a PCN reaction causing immediate rash, facial/tongue/throat swelling, SOB or lightheadedness with hypotension: Yes Has patient had a PCN reaction causing severe rash involving mucus membranes or skin necrosis: Unknown Has patient had a PCN reaction that required hospitalization: No Has patient had a PCN reaction occurring within the last 10 years: No If all of the above answers are "NO", then may proceed with Cephalosporin use.    Objective:  There were no vitals filed for this visit. There is no height or weight on file to calculate BMI. Constitutional Well developed. Well nourished.  Vascular Dorsalis pedis pulses palpable  bilaterally. Posterior tibial pulses palpable bilaterally. Capillary refill normal to all digits.  No cyanosis or clubbing noted. Pedal hair growth normal.  Neurologic Normal speech. Oriented to person, place, and time. Protective sensation absent  Dermatologic Wound Location: Right lateral side fifth digit stable eschar.  Unstageable depth Wound Base: Necrotic eschar Peri-wound: Normal Exudate: None: wound tissue dry Wound Measurements: -See below  Orthopedic: No pain to palpation either foot.   Radiographs: We will plan on getting them next time. Assessment:   1. Peripheral vascular disease ([place])   2. Pressure ulcer of toe of right foot, unstageable ([place])    Plan:  Patient was evaluated and treated and all questions  answered.  Ulcer right lateral fifth digit stable eschar -Debridement as below. -Dressed with Betadine wet-to-dry, DSD. -Continue off-loading with surgical shoe. -Surgical shoe was dispensed -I will await the angiogram this patient undergoing to the right lower extremity.  If she has intervention and and if there is any improvement in the flow I will consider excisional debridement and more aggressive local wound care.  However at this time given the amount of pain that she is having as well as poor circulation I will just continue to local wound care for now. -If this worsens without good circulation to the right lower extremity patient is a high risk of major amputation versus minor toe amputation.  I discussed this with the patient in extensive detail. -I will plan on obtaining x-rays during next visit.   No follow-ups on file.

## 2019-08-01 ENCOUNTER — Other Ambulatory Visit (INDEPENDENT_AMBULATORY_CARE_PROVIDER_SITE_OTHER): Payer: Self-pay | Admitting: Nurse Practitioner

## 2019-08-02 ENCOUNTER — Other Ambulatory Visit: Payer: Self-pay

## 2019-08-02 ENCOUNTER — Ambulatory Visit
Admission: RE | Admit: 2019-08-02 | Discharge: 2019-08-02 | Disposition: A | Discharge status: Home / Self Care | Payer: Medicare Other | Attending: Vascular Surgery | Admitting: Vascular Surgery

## 2019-08-02 ENCOUNTER — Telehealth: Payer: Self-pay

## 2019-08-02 ENCOUNTER — Encounter: Payer: Self-pay | Admitting: Vascular Surgery

## 2019-08-02 ENCOUNTER — Encounter
Admission: RE | Disposition: A | Discharge status: Home / Self Care | Payer: Self-pay | Source: Home / Self Care | Attending: Vascular Surgery

## 2019-08-02 DIAGNOSIS — Z7982 Long term (current) use of aspirin: Secondary | ICD-10-CM | POA: Diagnosis not present

## 2019-08-02 DIAGNOSIS — M1990 Unspecified osteoarthritis, unspecified site: Secondary | ICD-10-CM | POA: Insufficient documentation

## 2019-08-02 DIAGNOSIS — Z79899 Other long term (current) drug therapy: Secondary | ICD-10-CM | POA: Insufficient documentation

## 2019-08-02 DIAGNOSIS — Z8249 Family history of ischemic heart disease and other diseases of the circulatory system: Secondary | ICD-10-CM | POA: Diagnosis not present

## 2019-08-02 DIAGNOSIS — K219 Gastro-esophageal reflux disease without esophagitis: Secondary | ICD-10-CM | POA: Diagnosis not present

## 2019-08-02 DIAGNOSIS — E785 Hyperlipidemia, unspecified: Secondary | ICD-10-CM | POA: Insufficient documentation

## 2019-08-02 DIAGNOSIS — I70221 Atherosclerosis of native arteries of extremities with rest pain, right leg: Secondary | ICD-10-CM | POA: Diagnosis not present

## 2019-08-02 DIAGNOSIS — Z880 Allergy status to penicillin: Secondary | ICD-10-CM | POA: Insufficient documentation

## 2019-08-02 DIAGNOSIS — I10 Essential (primary) hypertension: Secondary | ICD-10-CM | POA: Diagnosis not present

## 2019-08-02 DIAGNOSIS — M069 Rheumatoid arthritis, unspecified: Secondary | ICD-10-CM | POA: Diagnosis not present

## 2019-08-02 DIAGNOSIS — Z87891 Personal history of nicotine dependence: Secondary | ICD-10-CM | POA: Diagnosis not present

## 2019-08-02 DIAGNOSIS — Z7901 Long term (current) use of anticoagulants: Secondary | ICD-10-CM | POA: Diagnosis not present

## 2019-08-02 DIAGNOSIS — G2581 Restless legs syndrome: Secondary | ICD-10-CM | POA: Diagnosis not present

## 2019-08-02 DIAGNOSIS — I4819 Other persistent atrial fibrillation: Secondary | ICD-10-CM | POA: Insufficient documentation

## 2019-08-02 DIAGNOSIS — I70299 Other atherosclerosis of native arteries of extremities, unspecified extremity: Secondary | ICD-10-CM

## 2019-08-02 DIAGNOSIS — L97909 Non-pressure chronic ulcer of unspecified part of unspecified lower leg with unspecified severity: Secondary | ICD-10-CM

## 2019-08-02 HISTORY — PX: LOWER EXTREMITY ANGIOGRAPHY: CATH118251

## 2019-08-02 LAB — CREATININE, SERUM
Creatinine, Ser: 1.09 mg/dL — ABNORMAL HIGH (ref 0.44–1.00)
GFR calc Af Amer: 52 mL/min — ABNORMAL LOW (ref >60)
GFR calc non Af Amer: 45 mL/min — ABNORMAL LOW (ref >60)

## 2019-08-02 LAB — BUN: BUN: 17 mg/dL (ref 8–23)

## 2019-08-02 SURGERY — LOWER EXTREMITY ANGIOGRAPHY
Anesthesia: Moderate Sedation | Site: Leg Lower | Laterality: Right

## 2019-08-02 MED ORDER — HYDROMORPHONE HCL 1 MG/ML IJ SOLN: 1.0000 mg | Freq: Once | INTRAMUSCULAR | Status: DC | PRN

## 2019-08-02 MED ORDER — HEPARIN SODIUM (PORCINE) 1000 UNIT/ML IJ SOLN
INTRAMUSCULAR | Status: DC | PRN
  Administered 2019-08-02: 4000 [IU] via INTRAVENOUS

## 2019-08-02 MED ORDER — CLINDAMYCIN PHOSPHATE 300 MG/50ML IV SOLN
INTRAVENOUS | Status: AC
  Administered 2019-08-02: 300 mg via INTRAVENOUS
  Filled 2019-08-02: qty 50

## 2019-08-02 MED ORDER — LABETALOL HCL 5 MG/ML IV SOLN: 10.0000 mg | INTRAVENOUS | Status: DC | PRN

## 2019-08-02 MED ORDER — IODIXANOL 320 MG/ML IV SOLN
INTRAVENOUS | Status: DC | PRN
  Administered 2019-08-02: 09:00:00 55 mL

## 2019-08-02 MED ORDER — SODIUM CHLORIDE 0.9 % IV SOLN: INTRAVENOUS | Status: DC

## 2019-08-02 MED ORDER — MIDAZOLAM HCL 2 MG/ML PO SYRP: 8.0000 mg | ORAL_SOLUTION | Freq: Once | ORAL | Status: DC | PRN

## 2019-08-02 MED ORDER — SODIUM CHLORIDE 0.9% FLUSH: 3.0000 mL | Freq: Two times a day (BID) | INTRAVENOUS | Status: DC

## 2019-08-02 MED ORDER — DIPHENHYDRAMINE HCL 50 MG/ML IJ SOLN: 50.0000 mg | Freq: Once | INTRAMUSCULAR | Status: DC | PRN

## 2019-08-02 MED ORDER — SODIUM CHLORIDE 0.9 % IV SOLN: 250.0000 mL | INTRAVENOUS | Status: DC | PRN

## 2019-08-02 MED ORDER — ONDANSETRON HCL 4 MG/2ML IJ SOLN: 4.0000 mg | Freq: Four times a day (QID) | INTRAMUSCULAR | Status: DC | PRN

## 2019-08-02 MED ORDER — HYDRALAZINE HCL 20 MG/ML IJ SOLN: 5.0000 mg | INTRAMUSCULAR | Status: DC | PRN

## 2019-08-02 MED ORDER — SODIUM CHLORIDE 0.9% FLUSH: 3.0000 mL | INTRAVENOUS | Status: DC | PRN

## 2019-08-02 MED ORDER — FAMOTIDINE 20 MG PO TABS: 40.0000 mg | ORAL_TABLET | Freq: Once | ORAL | Status: DC | PRN

## 2019-08-02 MED ORDER — ACETAMINOPHEN 325 MG PO TABS: 650.0000 mg | ORAL_TABLET | ORAL | Status: DC | PRN

## 2019-08-02 MED ORDER — FENTANYL CITRATE (PF) 100 MCG/2ML IJ SOLN
INTRAMUSCULAR | Status: DC | PRN
  Administered 2019-08-02: 50 ug via INTRAVENOUS

## 2019-08-02 MED ORDER — MIDAZOLAM HCL 5 MG/5ML IJ SOLN
INTRAMUSCULAR | Status: AC
  Filled 2019-08-02: qty 5

## 2019-08-02 MED ORDER — METHYLPREDNISOLONE SODIUM SUCC 125 MG IJ SOLR: 125.0000 mg | Freq: Once | INTRAMUSCULAR | Status: DC | PRN

## 2019-08-02 MED ORDER — MIDAZOLAM HCL 2 MG/2ML IJ SOLN
INTRAMUSCULAR | Status: DC | PRN
  Administered 2019-08-02: 2 mg via INTRAVENOUS

## 2019-08-02 MED ORDER — FENTANYL CITRATE (PF) 100 MCG/2ML IJ SOLN
INTRAMUSCULAR | Status: AC
  Filled 2019-08-02: qty 2

## 2019-08-02 MED ORDER — CLINDAMYCIN PHOSPHATE 300 MG/50ML IV SOLN: 300.0000 mg | Freq: Once | INTRAVENOUS | Status: AC

## 2019-08-02 MED ORDER — HEPARIN SODIUM (PORCINE) 1000 UNIT/ML IJ SOLN
INTRAMUSCULAR | Status: AC
  Filled 2019-08-02: qty 1

## 2019-08-02 SURGICAL SUPPLY — 17 items
BALLN ULTRVRSE 018 2.5X150X150 (BALLOONS) ×3
BALLN ULTRVRSE 2X300X150 (BALLOONS) ×3
BALLN ULTRVRSE 2X300X150 OTW (BALLOONS) ×1
BALLOON ULTRVRSE 2X300X150 OTW (BALLOONS) IMPLANT
BALLOON ULTRVS 018 2.5X150X150 (BALLOONS) IMPLANT
CATH BEACON 5 .038 100 VERT TP (CATHETERS) ×2 IMPLANT
CATH PIG 70CM (CATHETERS) ×2 IMPLANT
DEVICE PRESTO INFLATION (MISCELLANEOUS) ×2 IMPLANT
DEVICE STARCLOSE SE CLOSURE (Vascular Products) ×2 IMPLANT
GLIDEWIRE ADV .035X260CM (WIRE) ×2 IMPLANT
PACK ANGIOGRAPHY (CUSTOM PROCEDURE TRAY) ×2 IMPLANT
SHEATH BRITE TIP 5FRX11 (SHEATH) ×2 IMPLANT
SHEATH RAABE 6FRX70 (SHEATH) ×2 IMPLANT
SYR MEDRAD MARK 7 150ML (SYRINGE) ×2 IMPLANT
TUBING CONTRAST HIGH PRESS 72 (TUBING) ×2 IMPLANT
WIRE G V18X300CM (WIRE) ×2 IMPLANT
WIRE J 3MM .035X145CM (WIRE) ×2 IMPLANT

## 2019-08-02 NOTE — Telephone Encounter (Signed)
Per appt desk appt has already been rescheduled by [facility].

## 2019-08-02 NOTE — Discharge Instructions (Signed)
Femoral Site Care This sheet gives you information about how to care for yourself after your procedure. Your health care provider may also give you more specific instructions. If you have problems or questions, contact your health care provider. What can I expect after the procedure? After the procedure, it is common to have:  Bruising that usually fades within 1-2 weeks.  Tenderness at the site. Follow these instructions at home: Wound care  Follow instructions from your health care provider about how to take care of your insertion site. Make sure you: ? Wash your hands with soap and water before you change your bandage (dressing). If soap and water are not available, use hand sanitizer. ? Change your dressing as told by your health care provider. ? Leave stitches (sutures), skin glue, or adhesive strips in place. These skin closures may need to stay in place for 2 weeks or longer. If adhesive strip edges start to loosen and curl up, you may trim the loose edges. Do not remove adhesive strips completely unless your health care provider tells you to do that.  Do not take baths, swim, or use a hot tub until your health care provider approves.  You may shower 24-48 hours after the procedure or as told by your health care provider. ? Gently wash the site with plain soap and water. ? Pat the area dry with a clean towel. ? Do not rub the site. This may cause bleeding.  Do not apply powder or lotion to the site. Keep the site clean and dry.  Check your femoral site every day for signs of infection. Check for: ? Redness, swelling, or pain. ? Fluid or blood. ? Warmth. ? Pus or a bad smell. Activity  For the first 2-3 days after your procedure, or as long as directed: ? Avoid climbing stairs as much as possible. ? Do not squat.  Do not lift anything that is heavier than 10 lb (4.5 kg), or the limit that you are told, until your health care provider says that it is safe.  Rest as  directed. ? Avoid sitting for a long time without moving. Get up to take short walks every 1-2 hours.  Do not drive for 24 hours if you were given a medicine to help you relax (sedative). General instructions  Take over-the-counter and prescription medicines only as told by your health care provider.  Keep all follow-up visits as told by your health care provider. This is important. Contact a health care provider if you have:  A fever or chills.  You have redness, swelling, or pain around your insertion site. Get help right away if:  The catheter insertion area swells very fast.  You pass out.  You suddenly start to sweat or your skin gets clammy.  The catheter insertion area is bleeding, and the bleeding does not stop when you hold steady pressure on the area.  The area near or just beyond the catheter insertion site becomes pale, cool, tingly, or numb. These symptoms may represent a serious problem that is an emergency. Do not wait to see if the symptoms will go away. Get medical help right away. Call your local emergency services (911 in the [place] Do not drive yourself to the hospital. Summary  After the procedure, it is common to have bruising that usually fades within 1-2 weeks.  Check your femoral site every day for signs of infection.  Do not lift anything that is heavier than 10 lb (4.5 kg), or the   limit that you are told, until your health care provider says that it is safe. This information is not intended to replace advice given to you by your health care provider. Make sure you discuss any questions you have with your health care provider. Document Revised: 04/07/2017 Document Reviewed: 04/07/2017 Elsevier Patient Education  2020 [employer] Angiogram, Care After This sheet gives you information about how to care for yourself after your procedure. Your doctor may also give you more specific instructions. If you have problems or questions, contact your  doctor. Follow these instructions at home: Insertion site care  Follow instructions from your doctor about how to take care of your long, thin tube (catheter) insertion area. Make sure you: ? Wash your hands with soap and water before you change your bandage (dressing). If you cannot use soap and water, use hand sanitizer. ? Change your bandage as told by your doctor. ? Leave stitches (sutures), skin glue, or skin tape (adhesive) strips in place. They may need to stay in place for 2 weeks or longer. If tape strips get loose and curl up, you may trim the loose edges. Do not remove tape strips completely unless your doctor says it is okay.  Do not take baths, swim, or use a hot tub until your doctor says it is okay.  You may shower 24-48 hours after the procedure or as told by your doctor. ? Gently wash the area with plain soap and water. ? Pat the area dry with a clean towel. ? Do not rub the area. This may cause bleeding.  Do not apply powder or lotion to the area. Keep the area clean and dry.  Check your insertion area every day for signs of infection. Check for: ? More redness, swelling, or pain. ? Fluid or blood. ? Warmth. ? Pus or a bad smell. Activity  Rest as told by your doctor, usually for 1-2 days.  Do not lift anything that is heavier than 10 lbs. (4.5 kg) or as told by your doctor.  Do not drive for 24 hours if you were given a medicine to help you relax (sedative).  Do not drive or use heavy machinery while taking prescription pain medicine. General instructions   Go back to your normal activities as told by your doctor, usually in about a week. Ask your doctor what activities are safe for you.  If the insertion area starts to bleed, lie flat and put pressure on the area. If the bleeding does not stop, get help right away. This is an emergency.  Drink enough fluid to keep your pee (urine) clear or pale yellow.  Take over-the-counter and prescription medicines only  as told by your doctor.  Keep all follow-up visits as told by your doctor. This is important. Contact a doctor if:  You have a fever.  You have chills.  You have more redness, swelling, or pain around your insertion area.  You have fluid or blood coming from your insertion area.  The insertion area feels warm to the touch.  You have pus or a bad smell coming from your insertion area.  You have more bruising around the insertion area.  Blood collects in the tissue around the insertion area (hematoma) that may be painful to the touch. Get help right away if:  You have a lot of pain in the insertion area.  The insertion area swells very fast.  The insertion area is bleeding, and the bleeding does not stop after holding steady   pressure on the area.  The area near or just beyond the insertion area becomes pale, cool, tingly, or numb. These symptoms may be an emergency. Do not wait to see if the symptoms will go away. Get medical help right away. Call your local emergency services (911 in the [place] Do not drive yourself to the hospital. Summary  After the procedure, it is common to have bruising and tenderness at the long, thin tube insertion area.  After the procedure, it is important to rest and drink plenty of fluids.  Do not take baths, swim, or use a hot tub until your doctor says it is okay to do so. You may shower 24-48 hours after the procedure or as told by your doctor.  If the insertion area starts to bleed, lie flat and put pressure on the area. If the bleeding does not stop, get help right away. This is an emergency. This information is not intended to replace advice given to you by your health care provider. Make sure you discuss any questions you have with your health care provider. Document Revised: 03/07/2017 Document Reviewed: 03/19/2016 Elsevier Patient Education  2020 [employer] Inc. Moderate Conscious Sedation, Adult, Care After These instructions provide you  with information about caring for yourself after your procedure. Your health care provider may also give you more specific instructions. Your treatment has been planned according to current medical practices, but problems sometimes occur. Call your health care provider if you have any problems or questions after your procedure. What can I expect after the procedure? After your procedure, it is common:  To feel sleepy for several hours.  To feel clumsy and have poor balance for several hours.  To have poor judgment for several hours.  To vomit if you eat too soon. Follow these instructions at home: For at least 24 hours after the procedure:   Do not: ? Participate in activities where you could fall or become injured. ? Drive. ? Use heavy machinery. ? Drink alcohol. ? Take sleeping pills or medicines that cause drowsiness. ? Make important decisions or sign legal documents. ? Take care of children on your own.  Rest. Eating and drinking  Follow the diet recommended by your health care provider.  If you vomit: ? Drink water, juice, or soup when you can drink without vomiting. ? Make sure you have little or no nausea before eating solid foods. General instructions  Have a responsible adult stay with you until you are awake and alert.  Take over-the-counter and prescription medicines only as told by your health care provider.  If you smoke, do not smoke without supervision.  Keep all follow-up visits as told by your health care provider. This is important. Contact a health care provider if:  You keep feeling nauseous or you keep vomiting.  You feel light-headed.  You develop a rash.  You have a fever. Get help right away if:  You have trouble breathing. This information is not intended to replace advice given to you by your health care provider. Make sure you discuss any questions you have with your health care provider. Document Revised: 03/07/2017 Document Reviewed:  07/15/2015 Elsevier Patient Education  2020 [employer] Inc.  

## 2019-08-02 NOTE — Op Note (Signed)
[facility]  Percutaneous Study/Intervention Procedural Note   Date of Surgery: 08/02/2019  Surgeon(s):Janet Humphreys    Assistants:none  Pre-operative Diagnosis: PAD with rest pain right foot  Post-operative diagnosis:  Same  Procedure(s) Performed:             1.  Ultrasound guidance for vascular access left femoral artery             2.  Catheter placement into right anterior tibial, peroneal artery, and posterior tibial arteries from a femoral approach             3.  Aortogram and selective right lower extremity angiogram occluding selective imaging of the right anterior tibial artery, peroneal artery, and posterior tibial artery             4.  Percutaneous transluminal angioplasty of right peroneal artery with 2 mm diameter by 30 cm length angioplasty balloon             5.   Percutaneous transluminal angioplasty of the right posterior tibial artery with 2 mm diameter by 30 cm length angioplasty balloon in the mid and distal segments all the way into the foot, and a 2.5 mm diameter by 15 cm length angioplasty balloon to the proximal segment  6.  StarClose closure device left femoral artery  EBL: 5 cc  Contrast: 55 cc  Fluoro Time: 4.5 minutes  Moderate Conscious Sedation Time: approximately 30 minutes using 2 mg of Versed and 50 mcg of Fentanyl              Indications:  Patient is a 86 y.o.female with rest pain of the right foot and a known history of tibial disease. The patient is brought in for angiography for further evaluation and potential treatment.  Due to the limb threatening nature of the situation, angiogram was performed for attempted limb salvage. The patient is aware that if the procedure fails, amputation would be expected.  The patient also understands that even with successful revascularization, amputation may still be required due to the severity of the situation.  Risks and benefits are discussed and informed consent is obtained.    Procedure:  The patient was identified and appropriate procedural time out was performed.  The patient was then placed supine on the table and prepped and draped in the usual sterile fashion. Moderate conscious sedation was administered during a face to face encounter with the patient throughout the procedure with my supervision of the RN administering medicines and monitoring the patient's vital signs, pulse oximetry, telemetry and mental status throughout from the start of the procedure until the patient was taken to the recovery room. Ultrasound was used to evaluate the left common femoral artery.  It was patent .  A digital ultrasound image was acquired.  A Seldinger needle was used to access the left common femoral artery under direct ultrasound guidance and a permanent image was performed.  A 0.035 J wire was advanced without resistance and a 5Fr sheath was placed.  Pigtail catheter was placed into the aorta and an AP aortogram was performed. This demonstrated normal renal arteries and normal aorta and iliac segments without significant stenosis. I then crossed the aortic bifurcation and advanced to the right femoral head and then into the proximal SFA to help opacify distally. Selective right lower extremity angiogram was then performed. This demonstrated no hemodynamically significant stenosis of the right common femoral artery, profundofemoral artery, superficial femoral artery, or popliteal artery.  There was a  typical tibial trifurcation.  The circulation time is extremely slow and imaging even from the proximal SFA was very difficult to opacify the tibial vessels.  I proceeded with selective catheterization for imaging of the tibial vessels.  A Kumpe catheter and the advantage wire was advanced down into first of the anterior tibial artery where selective imaging was performed.  No hemodynamically significant stenosis was seen in the right anterior tibial artery and was continuous to the foot  although it was the slowest of the 3 arteries on initial imaging.  I then selectively cannulated the peroneal artery with a Kumpe catheter and the advantage wire and selective imaging showed multiple areas of short segment occlusion with reconstitution of the distal peroneal artery.  A Kumpe catheter in the posterior tibial artery was used and selective imaging showed 60 to 70% stenosis of the proximal posterior tibial artery and then the mid and distal segments had relatively high-grade stenosis of greater than 80% as the vessel became smaller but was continuous into the foot. It was felt that it was in the patient's best interest to proceed with intervention after these images to avoid a second procedure and a larger amount of contrast and fluoroscopy based off of the findings from the initial angiogram. The patient was systemically heparinized and a 6 French 70 cm sheath was then placed over the Terumo Advantage wire. I then used a Kumpe catheter and the advantage wire to get down into the peroneal artery and then exchanged for a V 18 wire which crossed the lesions and was parked at the ankle.  A 2 mm diameter by 30 cm length angioplasty balloon was then inflated to 10 atm from the distal peroneal artery up to the tibioperoneal trunk to encompass the multiple areas of occlusion.  Completion imaging showed markedly improved flow with less than 30% residual stenosis in the peroneal artery was now the fastest flow distally.  I then turned my attention of the posterior tibial artery.  The Kumpe catheter and the V 18 wire were used to selectively cannulate the posterior tibial artery and easily crossed the stenoses parking a wire well into the foot.  A 2 mm diameter by 30 cm length angioplasty balloon was inflated from the proximal foot posterior tibial artery up to the proximal to mid segment.  This was taken to the 8 atm for 1 minute.  A 2.5 mm diameter by 15 cm length angioplasty balloon was then inflated in the  proximal posterior tibial artery and tibioperoneal trunk and inflated to 8 atm for 1 minute.  Completion imaging showed about a 30 to 35% residual stenosis at the foot and ankle, and no other stenosis of greater than 25% was seen in the posterior tibial artery. I elected to terminate the procedure. The sheath was removed and StarClose closure device was deployed in the left femoral artery with excellent hemostatic result. The patient was taken to the recovery room in stable condition having tolerated the procedure well.  Findings:               Aortogram:  The aorta was normal and the iliac arteries were tortuous but widely patent.  The left main renal artery was low-lying but widely patent.  Right renal artery patent as well.             Right lower Extremity:  No hemodynamically significant stenosis of the right common femoral artery, profundofemoral artery, superficial femoral artery, or popliteal artery.  There was a typical tibial  trifurcation.  The circulation time is extremely slow and imaging even from the proximal SFA was very difficult to opacify the tibial vessels.  I proceeded with selective catheterization for imaging of the tibial vessels.  A Kumpe catheter and the advantage wire was advanced down into first of the anterior tibial artery where selective imaging was performed.  No hemodynamically significant stenosis was seen in the right anterior tibial artery and was continuous to the foot although it was the slowest of the 3 arteries on initial imaging.  I then selectively cannulated the peroneal artery with a Kumpe catheter and the advantage wire and selective imaging showed multiple areas of short segment occlusion with reconstitution of the distal peroneal artery.  A Kumpe catheter in the posterior tibial artery was used and selective imaging showed 60 to 70% stenosis of the proximal posterior tibial artery and then the mid and distal segments had relatively high-grade stenosis of greater than  80% as the vessel became smaller but was continuous into the foot.   Disposition: Patient was taken to the recovery room in stable condition having tolerated the procedure well.  Complications: None  Leotis Pain 08/02/2019 9:31 AM   This note was created with Dragon Medical transcription system. Any errors in dictation are purely unintentional.

## 2019-08-02 NOTE — H&P (Signed)
[facility] History & Physical Update  The patient was interviewed and re-examined.  The patient's previous History and Physical has been reviewed and is unchanged.  There is no change in the plan of care. We plan to proceed with the scheduled procedure.  Leotis Pain, MD  08/02/2019, 8:20 AM

## 2019-08-02 NOTE — Telephone Encounter (Signed)
[place] Night - Client Nonclinical Telephone Record AccessNurse Client [facility] Night - Client Client Site [place] Physician Ria Bush - MD Contact Type Call Who Is Calling Patient / Member / Family / Caregiver Caller Name [name] Caller Phone Number [phone] Patient Name [name] Patient DOB [birth year] Call Type Message Only Information Provided Reason for Call Request to Reschedule Office Appointment Initial Comment Caller states her mother has an appointment and wants to reschedule out a week or so. Additional Comment Provided office hours Disp. Time Disposition Final User 08/02/2019 7:38:56 AM General Information Provided Yes Vinnie Level Call Closed By: Vinnie Level Transaction Date/Time: 08/02/2019 7:35:59 AM (ET)

## 2019-08-03 ENCOUNTER — Ambulatory Visit: Payer: Medicare Other | Admitting: Family Medicine

## 2019-08-09 ENCOUNTER — Other Ambulatory Visit: Payer: Self-pay

## 2019-08-09 ENCOUNTER — Ambulatory Visit: Payer: Medicare Other | Admitting: Podiatry

## 2019-08-09 DIAGNOSIS — M79674 Pain in right toe(s): Secondary | ICD-10-CM | POA: Diagnosis not present

## 2019-08-09 DIAGNOSIS — L89890 Pressure ulcer of other site, unstageable: Secondary | ICD-10-CM | POA: Diagnosis not present

## 2019-08-09 DIAGNOSIS — M79675 Pain in left toe(s): Secondary | ICD-10-CM

## 2019-08-09 DIAGNOSIS — B351 Tinea unguium: Secondary | ICD-10-CM | POA: Diagnosis not present

## 2019-08-09 DIAGNOSIS — I739 Peripheral vascular disease, unspecified: Secondary | ICD-10-CM | POA: Diagnosis not present

## 2019-08-10 ENCOUNTER — Encounter: Payer: Self-pay | Admitting: Podiatry

## 2019-08-10 NOTE — Progress Notes (Signed)
Subjective:  Patient ID: [name], female    DOB: 08-Oct-1929,  MRN: [id number]  Chief Complaint  Patient presents with  . Nail Problem    nail trim, follow up ulcer  . Foot Ulcer    86 y.o. female presents for wound care.  Patient presents with a follow-up of right lateral fifth eschar formation that appears to be improving considerably.  Patient recently underwent angiography with intervention as described below.  It appears patient has better flow to the right lower extremity after vascular intervention.  They have been keeping it clean dry and intact.  Patient denies any other acute complaints.  She would like to discuss to have that taken elongated dystrophic toenails x10 debrided down.  She states that she is not able to do so at this is very thick and hard to cut.  She denies any other acute complaints.  She would like to have me debride them down.    Percutaneous transluminal angioplasty of right peroneal artery with 2 mm diameter by 30 cm length angioplasty balloon 5.  Percutaneous transluminal angioplasty of the right posterior tibial artery with 2 mm diameter by 30 cm length angioplasty balloon in the mid and distal segments all the way into the foot, and a 2.5 mm diameter by 15 cm length angioplasty balloon to the proximal segment   Review of Systems: Negative except as noted in the HPI. Denies N/V/F/Ch.  Past Medical History:  Diagnosis Date  . Abdominal aortic atherosclerosis ([place]) 09/2015   by xray  . Anxiety   . BCC (basal cell carcinoma of skin) 05/2011   on nose  . Breast lesion    a. diagnosed with complex sclerosing lesion with calcifications of the right breast in 08/2016  . Chest pain    a. nuclear stress test 03/2012: normal; b. 09/2018 MV: EF >65%, no isch/scar.   . DDD (degenerative disc disease), lumbosacral 12/06/1992  . Gallstones 09/2015   incidentally by xray  . GERD (gastroesophageal reflux disease) 05/1999  . HERPES ZOSTER 04/13/2007   Qualifier: Diagnosis of  By: [name], [name]   . Hiatal hernia   . History of shingles    ophthalmic, takes acyclovir daily  . Hyperlipidemia 12/2003  . Hypertension 1990  . Mitral regurgitation    a. echo 03/2012: EF 60-65%, no RWMA, mild to mod AI/MR, mod TR, PASP 37 mmHg  . Osteoarthritis 08/21/1989  . Osteoporosis with fracture 11/1997   with compression fracture T12  . Persistent atrial fibrillation ([place]) 03/16/2012   a. CHADS2VASc => 5 (HTN, age x 2, vascular disease, sex category)-->Eliquis 5 BID;  b. Recurrent AF in 06/2016;  c. 01/2017 s/p DCCV;  d. 02/2017 recurrent AFib->Amio added->DCCV; e. 03/2017 recurrent AFib, s/p reload of amio and DCCV 04/02/2017  . Rheumatoid arthritis ([place])   . RLS (restless legs syndrome)     Current Outpatient Medications:  .  acetaminophen (TYLENOL) 500 MG tablet, Take 1,000 mg every 8 (eight) hours as needed by mouth for moderate pain. , Disp: , Rfl:  .  acyclovir (ZOVIRAX) 400 MG tablet, Take 400 mg 2 (two) times daily by mouth. , Disp: , Rfl:  .  amiodarone (PACERONE) 100 MG tablet, Take 1 tablet (100 mg total) by mouth daily. (Patient taking differently: Take 100 mg by mouth in the morning and at bedtime. ), Disp: , Rfl:  .  aspirin EC 81 MG tablet, Take 1 tablet (81 mg total) by mouth daily., Disp: 150 tablet, Rfl: 2 .  atorvastatin (LIPITOR) 20 MG tablet, TAKE 1 TABLET BY MOUTH ONCE DAILY, Disp: 90 tablet, Rfl: 0 .  DULoxetine (CYMBALTA) 60 MG capsule, TAKE 1 CAPSULE BY MOUTH ONCE DAILY, Disp: 90 capsule, Rfl: 0 .  ELIQUIS 2.5 MG TABS tablet, TAKE ONE TABLET BY MOUTH TWICE DAILY (Patient taking differently: Take 2.5 mg by mouth 2 (two) times daily. ), Disp: 180 tablet, Rfl: 1 .  ergocalciferol (VITAMIN D2) 1.25 MG (50000 UT) capsule, Take 1 capsule (50,000 Units total) by mouth once a week. (Patient taking differently: Take 50,000 Units by mouth once a week. Saturday), Disp: 12 capsule, Rfl: 3 .  furosemide (LASIX) 40 MG tablet,  Take 1 tablet (40 mg total) by mouth daily., Disp: 30 tablet, Rfl: 5 .  gabapentin (NEURONTIN) 300 MG capsule, Take 1 capsule (300 mg total) by mouth at bedtime., Disp: , Rfl:  .  lisinopril (ZESTRIL) 10 MG tablet, Take 1 tablet (10 mg total) by mouth daily., Disp: 30 tablet, Rfl: 5 .  metoprolol tartrate (LOPRESSOR) 25 MG tablet, Take 0.5 tablets (12.5 mg total) by mouth 2 (two) times daily., Disp: 90 tablet, Rfl: 2 .  pantoprazole (PROTONIX) 40 MG tablet, TAKE 1 TABLET BY MOUTH EVERY DAY, Disp: 90 tablet, Rfl: 0 .  potassium chloride SA (KLOR-CON) 20 MEQ tablet, Take 0.5 tablets (10 mEq total) by mouth 2 (two) times daily., Disp: 30 tablet, Rfl: 6 .  prednisoLONE acetate (PRED FORTE) 1 % ophthalmic suspension, Place 1 drop into the left eye daily. , Disp: , Rfl:  .  rOPINIRole (REQUIP) 1 MG tablet, TAKE 2 TABLETS BY MOUTH AT BEDTIME (Patient taking differently: Take 2 mg by mouth at bedtime. ), Disp: 180 tablet, Rfl: 3 .  tiZANidine (ZANAFLEX) 2 MG tablet, Take 2 mg by mouth every evening. , Disp: , Rfl:  .  traMADol (ULTRAM) 50 MG tablet, Take 50 mg by mouth daily. , Disp: , Rfl: 0  Social History   Tobacco Use  Smoking Status Former Smoker  . Packs/day: 0.25  . Years: 1.00  . Pack years: 0.25  . Quit date: 04/08/1976  . Years since quitting: 43.3  Smokeless Tobacco Never Used  Tobacco Comment   pt smokes occasionally "socially"    Allergies  Allergen Reactions  . Penicillins Rash and Other (See Comments)    Childhood Allergy Has patient had a PCN reaction causing immediate rash, facial/tongue/throat swelling, SOB or lightheadedness with hypotension: Yes Has patient had a PCN reaction causing severe rash involving mucus membranes or skin necrosis: Unknown Has patient had a PCN reaction that required hospitalization: No Has patient had a PCN reaction occurring within the last 10 years: No If all of the above answers are "NO", then may proceed with Cephalosporin use.    Objective:    There were no vitals filed for this visit. There is no height or weight on file to calculate BMI. Constitutional Well developed. Well nourished.  Vascular Dorsalis pedis pulses palpable bilaterally. Posterior tibial pulses palpable bilaterally. Capillary refill normal to all digits.  No cyanosis or clubbing noted. Pedal hair growth normal.  Neurologic Normal speech. Oriented to person, place, and time. Protective sensation absent  Dermatologic Wound Location: Right lateral side fifth digit stable eschar.  Unstageable depth Wound Base: Necrotic eschar Nail Exam: Pt has thick disfigured discolored nails with subungual debris noted bilateral entire nail hallux through fifth toenails.  Pain on palpation to the nails. Peri-wound: Normal Exudate: None: wound tissue dry Wound Measurements: -See below  Orthopedic: No  pain to palpation either foot.   Radiographs: We will plan on getting them next time. Assessment:   1. Peripheral vascular disease ([place])   2. Pressure ulcer of toe of right foot, unstageable ([facility])   3. Pain due to onychomycosis of toenails of both feet    Plan:  Patient was evaluated and treated and all questions answered.  Ulcer right lateral fifth digit stable eschar -Dressed with Betadine wet-to-dry, DSD. -Continue off-loading with surgical shoe. -Surgical shoe was dispensed -Angiogram shows improvement in circulation to the right lower extremity.  However I did explain to the patient that this does not mean that the wound itself will completely heal.  However given that she is clinically progressing in the right direction with improvement of pain and it appears that the eschar is more decreasing in depth.  I am hopeful that the wound will improve with time and local wound care.  If unable to do so we will consider wound care management. -I have explained the patient to apply Betadine wet-to-dry dressing changes 3 times a week to keep it nice and dry. -If this worsens  without good circulation to the right lower extremity patient is a high risk of major amputation versus minor toe amputation.  I discussed this with the patient in extensive detail. -I will plan on obtaining x-rays during next visit. I will hold off on excisional debridement and allow for the wound to resolve itself.  If unable to resolve by next clinical visit we will plan on doing excisional debridement at that time.   Onychomycosis with pain  -Nails palliatively debrided as below. -Educated on self-care  Procedure: Nail Debridement Rationale: pain  Type of Debridement: manual, sharp debridement. Instrumentation: Nail nipper, rotary burr. Number of Nails: 10  Procedures and Treatment: Consent by patient was obtained for treatment procedures. The patient understood the discussion of treatment and procedures well. All questions were answered thoroughly reviewed. Debridement of mycotic and hypertrophic toenails, 1 through 5 bilateral and clearing of subungual debris. No ulceration, no infection noted.  Return Visit-Office Procedure: Patient instructed to return to the office for a follow up visit 3 months for continued evaluation and treatment.  Boneta Lucks, DPM    No follow-ups on file.    No follow-ups on file.

## 2019-08-12 ENCOUNTER — Telehealth: Payer: Self-pay

## 2019-08-12 NOTE — Telephone Encounter (Signed)
Pt has [employer] MCR.  No PA rqd.  Pt would owe approx. $240 (20% for Prolia) and $0 for admin fee.

## 2019-08-13 ENCOUNTER — Other Ambulatory Visit: Payer: Self-pay

## 2019-08-13 ENCOUNTER — Ambulatory Visit (INDEPENDENT_AMBULATORY_CARE_PROVIDER_SITE_OTHER): Payer: Medicare Other | Admitting: Family Medicine

## 2019-08-13 ENCOUNTER — Encounter: Payer: Self-pay | Admitting: Family Medicine

## 2019-08-13 VITALS — BP 122/70 | HR 67 | Temp 97.6°F | Ht 62.0 in | Wt 143.2 lb

## 2019-08-13 DIAGNOSIS — G6289 Other specified polyneuropathies: Secondary | ICD-10-CM

## 2019-08-13 DIAGNOSIS — I10 Essential (primary) hypertension: Secondary | ICD-10-CM | POA: Diagnosis not present

## 2019-08-13 DIAGNOSIS — I739 Peripheral vascular disease, unspecified: Secondary | ICD-10-CM

## 2019-08-13 DIAGNOSIS — M810 Age-related osteoporosis without current pathological fracture: Secondary | ICD-10-CM

## 2019-08-13 DIAGNOSIS — E538 Deficiency of other specified B group vitamins: Secondary | ICD-10-CM

## 2019-08-13 DIAGNOSIS — L97519 Non-pressure chronic ulcer of other part of right foot with unspecified severity: Secondary | ICD-10-CM

## 2019-08-13 MED ORDER — CYANOCOBALAMIN 1000 MCG/ML IJ SOLN
1000.0000 ug | Freq: Once | INTRAMUSCULAR | Status: AC
  Administered 2019-08-13: 1000 ug via INTRAMUSCULAR

## 2019-08-13 NOTE — Progress Notes (Signed)
This visit was conducted in person.  BP 122/70 (BP Location: Left Arm, Patient Position: Sitting, Cuff Size: Normal)   Pulse 67   Temp 97.6 F (36.4 C) (Temporal)   Ht 5\' 2"  (1.575 m)   Wt 143 lb 3 oz (64.9 kg)   SpO2 97%   BMI 26.19 kg/m    CC: 3 mo f/u visit  Subjective:    Patient ID: [name], female    DOB: 05-May-1929, 86 y.o.   MRN: [id number]  HPI: [name] is a 86 y.o. female presenting on 08/13/2019 for Follow-up (Here for 3 mo f/u.  Pt accompanied by daughter, [name]- temp 97.9.)   She is feeling well today.  She lives in [place] - really likes her apt complex.   Known severe PAD followed by [facility] now with pressure ulcer of R lateral foot - seeing podiatry.  She did have heart catheterization last month showing mild-moderate nonobstructive CAD, saw cardiology in follow up 07/2019.  Recent back imaging showed compression fractures of T5 and L1.   Recent (07/2019) percutaneous transluminal angioplasty ofright peroneal artery as well as percutaneous transluminal angioplasty of the right posterior tibial artery (Dr Lucky Cowboy). She previously underwent angioplasty of bilateral tibial vessels.   She does feel she's breathing well, denies chest pain.      Relevant past medical, surgical, family and social history reviewed and updated as indicated. Interim medical history since our last visit reviewed. Allergies and medications reviewed and updated. Outpatient Medications Prior to Visit  Medication Sig Dispense Refill  . acetaminophen (TYLENOL) 500 MG tablet Take 1,000 mg every 8 (eight) hours as needed by mouth for moderate pain.     Marland Kitchen acyclovir (ZOVIRAX) 400 MG tablet Take 400 mg 2 (two) times daily by mouth.     Marland Kitchen amiodarone (PACERONE) 100 MG tablet Take 1 tablet (100 mg total) by mouth daily. (Patient taking differently: Take 100 mg by mouth in the morning and at bedtime. )    . aspirin EC 81 MG tablet Take 1 tablet (81 mg total) by mouth daily. 150 tablet  2  . atorvastatin (LIPITOR) 20 MG tablet TAKE 1 TABLET BY MOUTH ONCE DAILY 90 tablet 0  . DULoxetine (CYMBALTA) 60 MG capsule TAKE 1 CAPSULE BY MOUTH ONCE DAILY 90 capsule 0  . ELIQUIS 2.5 MG TABS tablet TAKE ONE TABLET BY MOUTH TWICE DAILY (Patient taking differently: Take 2.5 mg by mouth 2 (two) times daily. ) 180 tablet 1  . ergocalciferol (VITAMIN D2) 1.25 MG (50000 UT) capsule Take 1 capsule (50,000 Units total) by mouth once a week. (Patient taking differently: Take 50,000 Units by mouth once a week. Saturday) 12 capsule 3  . furosemide (LASIX) 40 MG tablet Take 1 tablet (40 mg total) by mouth daily. 30 tablet 5  . gabapentin (NEURONTIN) 300 MG capsule Take 1 capsule (300 mg total) by mouth at bedtime.    Marland Kitchen lisinopril (ZESTRIL) 10 MG tablet Take 1 tablet (10 mg total) by mouth daily. 30 tablet 5  . metoprolol tartrate (LOPRESSOR) 25 MG tablet Take 0.5 tablets (12.5 mg total) by mouth 2 (two) times daily. 90 tablet 2  . pantoprazole (PROTONIX) 40 MG tablet TAKE 1 TABLET BY MOUTH EVERY DAY 90 tablet 0  . potassium chloride SA (KLOR-CON) 20 MEQ tablet Take 0.5 tablets (10 mEq total) by mouth 2 (two) times daily. 30 tablet 6  . prednisoLONE acetate (PRED FORTE) 1 % ophthalmic suspension Place 1 drop into the left  eye daily.     Marland Kitchen rOPINIRole (REQUIP) 1 MG tablet TAKE 2 TABLETS BY MOUTH AT BEDTIME (Patient taking differently: Take 2 mg by mouth at bedtime. ) 180 tablet 3  . tiZANidine (ZANAFLEX) 2 MG tablet Take 2 mg by mouth every evening.     . traMADol (ULTRAM) 50 MG tablet Take 50 mg by mouth daily.   0   No facility-administered medications prior to visit.     Per HPI unless specifically indicated in ROS section below Review of Systems Objective:  BP 122/70 (BP Location: Left Arm, Patient Position: Sitting, Cuff Size: Normal)   Pulse 67   Temp 97.6 F (36.4 C) (Temporal)   Ht 5\' 2"  (1.575 m)   Wt 143 lb 3 oz (64.9 kg)   SpO2 97%   BMI 26.19 kg/m   Wt Readings from Last 3  Encounters:  08/13/19 143 lb 3 oz (64.9 kg)  08/02/19 131 lb (59.4 kg)  07/28/19 139 lb (63 kg)      Physical Exam Vitals and nursing note reviewed.  Constitutional:      Appearance: Normal appearance.     Comments: Walks with walker  Cardiovascular:     Rate and Rhythm: Normal rate and regular rhythm.     Pulses: Normal pulses.     Heart sounds: Normal heart sounds. No murmur.  Pulmonary:     Effort: Pulmonary effort is normal. No respiratory distress.     Breath sounds: Normal breath sounds. No wheezing, rhonchi or rales.  Musculoskeletal:     Right lower leg: No edema.     Left lower leg: No edema.     Comments: 1+ DP on right, diminished DP on left  Skin:    General: Skin is cool.     Findings: Wound present. No rash.     Comments:  L>R foot purplish hue Right lateral foot ulcer - upper half with granulation tissue, lower half with eschar  Neurological:     Mental Status: She is alert.  Psychiatric:        Mood and Affect: Mood normal.        Behavior: Behavior normal.       Results for orders placed or performed during the hospital encounter of 08/02/19  BUN  Result Value Ref Range   BUN 17 8 - 23 mg/dL  Creatinine, serum  Result Value Ref Range   Creatinine, Ser 1.09 (H) 0.44 - 1.00 mg/dL   GFR calc non Af Amer 45 (L) >60 mL/min   GFR calc Af Amer 52 (L) >60 mL/min   Assessment & Plan:  This visit occurred during the SARS-CoV-2 public health emergency.  Safety protocols were in place, including screening questions prior to the visit, additional usage of staff PPE, and extensive cleaning of exam room while observing appropriate contact time as indicated for disinfecting solutions.   Problem List Items Addressed This Visit    Vitamin B12 deficiency - Primary    Continue b12 shots, another one today. Low 100s (04/2019). Will need rpt b12 levels next labwork.       Ulcer of right foot [facility])    May be improving (less eschar, more granulation tissue). Followed by  podiatry.       Peripheral neuropathy    Continues gabapentin 300mg  at night. Suggested also changing tramadol to night time dosing and discontinuing tizanidine.       PAD (peripheral artery disease) (HCC)    Appreciate [facility] care. Continues aspirin, statin, eliquis.  Recently had repeat angioplasty to R peroneal and R post tib arteries.       Osteoporosis    H/o compression fractures, possibly new T5 and L1 on imaging 06/2019.  Latest DEXA 06/2019 with T score of -2.4 forearm. Now on prolia. I believe she is now due       Essential hypertension    BP well controlled on current regimen of lisinopril 10mg , metoprolol 12.5mg  bid, and lasix 40mg  daily.           Meds ordered this encounter  Medications  . cyanocobalamin ((VITAMIN B-12)) injection 1,000 mcg   No orders of the defined types were placed in this encounter.   Patient Instructions  Switch tramadol to night time. Back off tizanidine.  Continue other medicines.  Continue monthly B12. Return in 3 months for follow up visit - we will check labs at that time.   Follow up plan: Return in about 3 months (around 11/13/2019) for follow up visit.  Ria Bush, MD

## 2019-08-13 NOTE — Patient Instructions (Addendum)
Switch tramadol to night time. Back off tizanidine.  Continue other medicines.  Continue monthly B12. Return in 3 months for follow up visit - we will check labs at that time.

## 2019-08-14 DIAGNOSIS — L97519 Non-pressure chronic ulcer of other part of right foot with unspecified severity: Secondary | ICD-10-CM | POA: Insufficient documentation

## 2019-08-14 NOTE — Assessment & Plan Note (Signed)
May be improving (less eschar, more granulation tissue). Followed by podiatry.

## 2019-08-14 NOTE — Assessment & Plan Note (Addendum)
H/o compression fractures, possibly new T5 and L1 on imaging 06/2019.  Latest DEXA 06/2019 with T score of -2.4 forearm. Now on prolia. I believe she is now due

## 2019-08-14 NOTE — Assessment & Plan Note (Addendum)
Appreciate [facility] care. Continues aspirin, statin, eliquis.  Recently had repeat angioplasty to R peroneal and R post tib arteries.

## 2019-08-14 NOTE — Assessment & Plan Note (Signed)
BP well controlled on current regimen of lisinopril 10mg , metoprolol 12.5mg  bid, and lasix 40mg  daily.

## 2019-08-14 NOTE — Assessment & Plan Note (Signed)
Continues gabapentin 300mg  at night. Suggested also changing tramadol to night time dosing and discontinuing tizanidine.

## 2019-08-14 NOTE — Assessment & Plan Note (Signed)
Continue b12 shots, another one today. Low 100s (04/2019). Will need rpt b12 levels next labwork.

## 2019-08-16 NOTE — Telephone Encounter (Signed)
Pt is due for prolia after 6/23.

## 2019-08-17 ENCOUNTER — Ambulatory Visit: Payer: Medicare Other

## 2019-08-23 ENCOUNTER — Other Ambulatory Visit (INDEPENDENT_AMBULATORY_CARE_PROVIDER_SITE_OTHER): Payer: Self-pay | Admitting: Vascular Surgery

## 2019-08-23 DIAGNOSIS — Z9862 Peripheral vascular angioplasty status: Secondary | ICD-10-CM

## 2019-08-23 DIAGNOSIS — I70221 Atherosclerosis of native arteries of extremities with rest pain, right leg: Secondary | ICD-10-CM

## 2019-08-24 ENCOUNTER — Other Ambulatory Visit: Payer: Self-pay

## 2019-08-24 ENCOUNTER — Encounter (INDEPENDENT_AMBULATORY_CARE_PROVIDER_SITE_OTHER): Payer: Self-pay | Admitting: Nurse Practitioner

## 2019-08-24 ENCOUNTER — Ambulatory Visit: Payer: Medicare Other

## 2019-08-24 ENCOUNTER — Ambulatory Visit (INDEPENDENT_AMBULATORY_CARE_PROVIDER_SITE_OTHER): Payer: Medicare Other

## 2019-08-24 ENCOUNTER — Ambulatory Visit (INDEPENDENT_AMBULATORY_CARE_PROVIDER_SITE_OTHER): Payer: Medicare Other | Admitting: Nurse Practitioner

## 2019-08-24 VITALS — BP 149/60 | HR 68 | Ht 61.0 in | Wt 140.0 lb

## 2019-08-24 DIAGNOSIS — L97519 Non-pressure chronic ulcer of other part of right foot with unspecified severity: Secondary | ICD-10-CM

## 2019-08-24 DIAGNOSIS — I739 Peripheral vascular disease, unspecified: Secondary | ICD-10-CM | POA: Diagnosis not present

## 2019-08-24 DIAGNOSIS — I70221 Atherosclerosis of native arteries of extremities with rest pain, right leg: Secondary | ICD-10-CM | POA: Diagnosis not present

## 2019-08-24 DIAGNOSIS — Z9862 Peripheral vascular angioplasty status: Secondary | ICD-10-CM

## 2019-08-24 DIAGNOSIS — R600 Localized edema: Secondary | ICD-10-CM | POA: Diagnosis not present

## 2019-08-24 NOTE — Progress Notes (Signed)
Subjective:    Patient ID: [name], female    DOB: 03/02/1930, 86 y.o.   MRN: [id number] Chief Complaint  Patient presents with  . Follow-up    U/S follow up    The patient returns to the office for followup and review status post angiogram with intervention. The patient notes improvement in the lower extremity symptoms. No interval shortening of the patient's claudication distance or rest pain symptoms.  Are showing improvement with healing.  No new ulcers or wounds have occurred since the last visit.  The patient's feet are warm and showing less discoloration.  There have been no significant changes to the patient's overall health care.  The patient denies amaurosis fugax or recent TIA symptoms. There are no recent neurological changes noted. The patient denies history of DVT, PE or superficial thrombophlebitis. The patient denies recent episodes of angina or shortness of breath.   ABI's Rt=1.30 and Lt=1.31  (previous ABI's Rt=0.99 and Lt=1.09) the TBI is 0.61 on the right and 0.58 on the left.,  Previously it was 0.28 on the right and 0.30 on the left. Duplex US of the left lower extremity shows triphasic waveforms within the tibial arteries.  Good toe waveforms.  The right lower extremity has biphasic/triphasic waveforms with good toe waveforms.   Review of Systems  Cardiovascular: Positive for leg swelling.  Skin: Positive for wound.  Neurological: Positive for weakness.  All other systems reviewed and are negative.      Objective:   Physical Exam Vitals reviewed.  Cardiovascular:     Rate and Rhythm: Normal rate and regular rhythm.     Pulses:          Dorsalis pedis pulses are 2+ on the right side and 1+ on the left side.       Posterior tibial pulses are 1+ on the right side and 1+ on the left side.  Pulmonary:     Effort: Pulmonary effort is normal.     Breath sounds: Normal breath sounds.  Musculoskeletal:     Right lower leg: 2+ Pitting Edema present.    Left lower leg: 1+ Pitting Edema present.  Skin:    General: Skin is warm and dry.     Capillary Refill: Capillary refill takes 2 to 3 seconds.  Neurological:     Mental Status: She is alert and oriented to person, place, and time.  Psychiatric:        Mood and Affect: Mood normal.        Behavior: Behavior normal.        Thought Content: Thought content normal.        Judgment: Judgment normal.     BP (!) 149/60   Pulse 68   Ht 5\' 1"  (1.549 m)   Wt 140 lb (63.5 kg)   BMI 26.45 kg/m   Past Medical History:  Diagnosis Date  . Abdominal aortic atherosclerosis ([place]) 09/2015   by xray  . Anxiety   . BCC (basal cell carcinoma of skin) 05/2011   on nose  . Breast lesion    a. diagnosed with complex sclerosing lesion with calcifications of the right breast in 08/2016  . Chest pain    a. nuclear stress test 03/2012: normal; b. 09/2018 MV: EF >65%, no isch/scar.   . DDD (degenerative disc disease), lumbosacral 12/06/1992  . Gallstones 09/2015   incidentally by xray  . GERD (gastroesophageal reflux disease) 05/1999  . HERPES ZOSTER 04/13/2007   Qualifier: Diagnosis of  By: [name], [name]   . Hiatal hernia   . History of shingles    ophthalmic, takes acyclovir daily  . Hyperlipidemia 12/2003  . Hypertension 1990  . Mitral regurgitation    a. echo 03/2012: EF 60-65%, no RWMA, mild to mod AI/MR, mod TR, PASP 37 mmHg  . Osteoarthritis 08/21/1989  . Osteoporosis with fracture 11/1997   with compression fracture T12  . Persistent atrial fibrillation ([place]) 03/16/2012   a. CHADS2VASc => 5 (HTN, age x 2, vascular disease, sex category)-->Eliquis 5 BID;  b. Recurrent AF in 06/2016;  c. 01/2017 s/p DCCV;  d. 02/2017 recurrent AFib->Amio added->DCCV; e. 03/2017 recurrent AFib, s/p reload of amio and DCCV 04/02/2017  . Rheumatoid arthritis ([place])   . RLS (restless legs syndrome)     Social History   Socioeconomic History  . Marital status: Widowed    Spouse name: Not on file   . Number of children: 5  . Years of education: Not on file  . Highest education level: Not on file  Occupational History  . Occupation: Retired - Occupational psychologist: RETIRED  Tobacco Use  . Smoking status: Former Smoker    Packs/day: 0.25    Years: 1.00    Pack years: 0.25    Quit date: 04/08/1976    Years since quitting: 43.4  . Smokeless tobacco: Never Used  . Tobacco comment: pt smokes occasionally "socially"  Substance and Sexual Activity  . Alcohol use: No    Alcohol/week: 1.0 standard drinks    Types: 1 Glasses of wine per week    Comment: occassional  . Drug use: No  . Sexual activity: Never  Other Topics Concern  . Not on file  Social History Narrative   Lives with grandson who smokes, dog   From [place].  Widowed 01/1999; 1st marriage 25 years / 2nd marriage 13 years.   One son deceased 2023/01/29.   Activity: active with dog   Diet: good water, fruits/vegetables daily   Social Determinants of Health   Financial Resource Strain: Low Risk   . Difficulty of Paying Living Expenses: Not hard at all  Food Insecurity: No Food Insecurity  . Worried About Charity fundraiser in the Last Year: Never true  . Ran Out of Food in the Last Year: Never true  Transportation Needs: No Transportation Needs  . Lack of Transportation (Medical): No  . Lack of Transportation (Non-Medical): No  Physical Activity: Inactive  . Days of Exercise per Week: 0 days  . Minutes of Exercise per Session: 0 min  Stress: No Stress Concern Present  . Feeling of Stress : Not at all  Social Connections:   . Frequency of Communication with Friends and Family:   . Frequency of Social Gatherings with Friends and Family:   . Attends Religious Services:   . Active Member of Clubs or Organizations:   . Attends Archivist Meetings:   Marland Kitchen Marital Status:   Intimate Partner Violence: Not At Risk  . Fear of Current or Ex-Partner: No  . Emotionally Abused: No  . Physically Abused: No  . Sexually  Abused: No    Past Surgical History:  Procedure Laterality Date  . ABDOMINAL HYSTERECTOMY  Age 62 - 89   S/P TAH  . abdominal ultrasound  04/23/2004   Gallstones  . BREAST EXCISIONAL BIOPSY Right 2018   neg/benign complex sclerosing lesion  . BREAST LUMPECTOMY WITH RADIOACTIVE SEED LOCALIZATION Right 10/2016   breast lumpectomy  with radioactive seed localization and margin assessment for complex sclerosing lesion Renelda Loma)  . BREAST LUMPECTOMY WITH RADIOACTIVE SEED LOCALIZATION Right 11/05/2016   Procedure: RIGHT BREAST LUMPECTOMY WITH RADIOACTIVE SEED LOCALIZATION;  Surgeon: Fanny Skates, MD;  Location: [facility];  Service: General;  Laterality: Right;  . CARDIAC CATHETERIZATION    . CARDIOVERSION N/A 01/31/2017   Procedure: CARDIOVERSION;  Surgeon: Wellington [place], MD;  Location: [facility];  Service: Cardiovascular;  Laterality: N/A;  . CARDIOVERSION N/A 03/03/2017   Procedure: CARDIOVERSION;  Surgeon: Wellington [place], MD;  Location: [facility];  Service: Cardiovascular;  Laterality: N/A;  . CARDIOVERSION N/A 04/02/2017   Procedure: CARDIOVERSION;  Surgeon: Wellington [place], MD;  Location: [facility];  Service: Cardiovascular;  Laterality: N/A;  . CARDIOVERSION N/A 01/16/2018   Procedure: CARDIOVERSION;  Surgeon: Wellington [place], MD;  Location: [facility];  Service: Cardiovascular;  Laterality: N/A;  . CATARACT EXTRACTION  05/2010   left eye  . CERVICAL DISCECTOMY  1992   Fusion (Dr. Joya Salm)  . ESI Bilateral 01/2016   S1 transforaminal ESI x3  . ESOPHAGOGASTRODUODENOSCOPY  01/01/2002   with ulcer, bx. neg; + stricture gastric ulcer with hemorrhage  . ESOPHAGOGASTRODUODENOSCOPY  04/23/2004   Esophageal stricture -- dilated  . INTRAVASCULAR PRESSURE WIRE/FFR STUDY N/A 07/12/2019   Procedure: INTRAVASCULAR PRESSURE WIRE/FFR STUDY;  Surgeon: Wellington [place], MD;  Location: [facility];  Service: Cardiovascular;  Laterality: N/A;  . LOWER EXTREMITY  ANGIOGRAPHY Left 02/01/2019   LOWER EXTREMITY ANGIOGRAPHY;  Surgeon: Algernon Huxley, MD  . LOWER EXTREMITY ANGIOGRAPHY Right 03/25/2019   Procedure: LOWER EXTREMITY ANGIOGRAPHY;  Surgeon: Algernon Huxley, MD;  Location: [facility];  Service: Cardiovascular;  Laterality: Right;  . LOWER EXTREMITY ANGIOGRAPHY Right 08/02/2019   Procedure: LOWER EXTREMITY ANGIOGRAPHY;  Surgeon: Algernon Huxley, MD;  Location: [facility];  Service: Cardiovascular;  Laterality: Right;  . nuclear stress test  03/2012   no ischemia  . PERIPHERAL VASCULAR BALLOON ANGIOPLASTY Left 01/2019   Percutaneous transluminal angioplasty of left anterior and posterior tibial artery with 2.5 mm diameter by 22 cm length angioplasty balloon ([place])  . RIGHT/LEFT HEART CATH AND CORONARY ANGIOGRAPHY Bilateral 07/12/2019   Procedure: RIGHT/LEFT HEART CATH AND CORONARY ANGIOGRAPHY;  Surgeon: Wellington [place], MD;  Location: [facility];  Service: Cardiovascular;  Laterality: Bilateral;  . SKIN CANCER EXCISION  05/20/2011   BCC from nose  . US ECHOCARDIOGRAPHY  03/2012   in flutter, EF 60%, mild-mod aortic, mitral, tricuspid regurg, mildly dilated LA    Family History  Problem Relation Age of Onset  . Emphysema Mother   . Heart disease Father        MI  . Stroke Father        first at age 89.  . Stroke Sister   . Diabetes Sister   . Hypertension Brother   . Heart disease Brother        Heart Valve  . Stroke Brother   . Diabetes Brother   . Cancer Brother        esophageal  . Diabetes Brother   . Diabetes Brother   . Colon cancer Neg Hx     Allergies  Allergen Reactions  . Penicillins Rash and Other (See Comments)    Childhood Allergy Has patient had a PCN reaction causing immediate rash, facial/tongue/throat swelling, SOB or lightheadedness with hypotension: Yes Has patient had a PCN reaction causing severe rash involving mucus  membranes or skin necrosis: Unknown Has patient had a PCN reaction that  required hospitalization: No Has patient had a PCN reaction occurring within the last 10 years: No If all of the above answers are "NO", then may proceed with Cephalosporin use.        Assessment & Plan:   1. PAD (peripheral artery disease) (HCC) Overall the patient's noninvasive studies show improvement studies.  Also the patient's wound is healing and her coloration in the bilateral feet is improved.  The patient will continue with her anticoagulation.  Patient is advised to contact her office if symptoms worsen however if not we will have the patient return in 3 months with noninvasive studies.  2. Ulcer of right foot, unspecified ulcer stage ([place]) Currently being treated by podiatry.  However the ulceration is showing signs of healing post angiogram.  We will maintain close follow-up if the wound shows deterioration, repeat angiogram may be necessary.  3. Bilateral lower extremity edema Patient is advised to utilize conservative therapy including compression, elevation and exercise if she can tolerate to help with her swelling.  The patient notes that her swelling is worse after being very active and standing for long periods over the last week or so.  Seems to be better as the patient has been elevating.  The patient is advised to continue with conservative treatment however if it fails to decrease or if he gets worse we may need to utilize some wraps to help with the patient swelling.   Current Outpatient Medications on File Prior to Visit  Medication Sig Dispense Refill  . acetaminophen (TYLENOL) 500 MG tablet Take 1,000 mg every 8 (eight) hours as needed by mouth for moderate pain.     Marland Kitchen acyclovir (ZOVIRAX) 400 MG tablet Take 400 mg 2 (two) times daily by mouth.     Marland Kitchen amiodarone (PACERONE) 100 MG tablet Take 1 tablet (100 mg total) by mouth daily. (Patient taking differently: Take 100 mg by mouth in the morning and at bedtime. )    . aspirin EC 81 MG tablet Take 1 tablet (81 mg total)  by mouth daily. 150 tablet 2  . atorvastatin (LIPITOR) 20 MG tablet TAKE 1 TABLET BY MOUTH ONCE DAILY 90 tablet 0  . DULoxetine (CYMBALTA) 60 MG capsule TAKE 1 CAPSULE BY MOUTH ONCE DAILY 90 capsule 0  . ELIQUIS 2.5 MG TABS tablet TAKE ONE TABLET BY MOUTH TWICE DAILY (Patient taking differently: Take 2.5 mg by mouth 2 (two) times daily. ) 180 tablet 1  . ergocalciferol (VITAMIN D2) 1.25 MG (50000 UT) capsule Take 1 capsule (50,000 Units total) by mouth once a week. (Patient taking differently: Take 50,000 Units by mouth once a week. Saturday) 12 capsule 3  . furosemide (LASIX) 40 MG tablet Take 1 tablet (40 mg total) by mouth daily. 30 tablet 5  . gabapentin (NEURONTIN) 300 MG capsule Take 1 capsule (300 mg total) by mouth at bedtime.    Marland Kitchen lisinopril (ZESTRIL) 10 MG tablet Take 1 tablet (10 mg total) by mouth daily. 30 tablet 5  . metoprolol tartrate (LOPRESSOR) 25 MG tablet Take 0.5 tablets (12.5 mg total) by mouth 2 (two) times daily. 90 tablet 2  . pantoprazole (PROTONIX) 40 MG tablet TAKE 1 TABLET BY MOUTH EVERY DAY 90 tablet 0  . potassium chloride SA (KLOR-CON) 20 MEQ tablet Take 0.5 tablets (10 mEq total) by mouth 2 (two) times daily. 30 tablet 6  . prednisoLONE acetate (PRED FORTE) 1 % ophthalmic suspension Place  1 drop into the left eye daily.     Marland Kitchen rOPINIRole (REQUIP) 1 MG tablet TAKE 2 TABLETS BY MOUTH AT BEDTIME (Patient taking differently: Take 2 mg by mouth at bedtime. ) 180 tablet 3  . traMADol (ULTRAM) 50 MG tablet Take 50 mg by mouth daily.   0  . tiZANidine (ZANAFLEX) 2 MG tablet Take 2 mg by mouth every evening.      No current facility-administered medications on file prior to visit.    There are no Patient Instructions on file for this visit. No follow-ups on file.   Kris Hartmann, NP

## 2019-09-01 DIAGNOSIS — M5136 Other intervertebral disc degeneration, lumbar region: Secondary | ICD-10-CM | POA: Diagnosis not present

## 2019-09-01 DIAGNOSIS — M5416 Radiculopathy, lumbar region: Secondary | ICD-10-CM | POA: Diagnosis not present

## 2019-09-01 DIAGNOSIS — M62830 Muscle spasm of back: Secondary | ICD-10-CM | POA: Diagnosis not present

## 2019-09-01 DIAGNOSIS — M48062 Spinal stenosis, lumbar region with neurogenic claudication: Secondary | ICD-10-CM | POA: Diagnosis not present

## 2019-09-02 ENCOUNTER — Ambulatory Visit: Payer: Medicare Other

## 2019-09-02 ENCOUNTER — Other Ambulatory Visit: Payer: Self-pay

## 2019-09-02 ENCOUNTER — Ambulatory Visit (INDEPENDENT_AMBULATORY_CARE_PROVIDER_SITE_OTHER): Payer: Medicare Other | Admitting: Podiatry

## 2019-09-02 DIAGNOSIS — L97512 Non-pressure chronic ulcer of other part of right foot with fat layer exposed: Secondary | ICD-10-CM

## 2019-09-02 DIAGNOSIS — I739 Peripheral vascular disease, unspecified: Secondary | ICD-10-CM | POA: Diagnosis not present

## 2019-09-02 MED ORDER — SANTYL 250 UNIT/GM EX OINT
1.0000 | TOPICAL_OINTMENT | Freq: Every day | CUTANEOUS | 0 refills | Status: DC
Start: 2019-09-02 — End: 2020-02-21

## 2019-09-08 ENCOUNTER — Encounter: Payer: Self-pay | Admitting: Podiatry

## 2019-09-08 NOTE — Progress Notes (Signed)
Subjective:  Patient ID: [name], female    DOB: 02-19-1930,  MRN: [id number]  Chief Complaint  Patient presents with  . Foot Pain    pt is here for a f/u of foot pain    86 y.o. female presents for wound care.  Patient presents with a follow-up of right lateral fifth eschar formation that appears to be improving considerably.  Patient recently underwent angiography with intervention as described below.  It appears patient has better flow to the right lower extremity after vascular intervention.  They have been keeping it clean dry and intact.  Patient denies any other acute complaints.  She would like to discuss to have that taken elongated dystrophic toenails x10 debrided down.  She states that she is not able to do so at this is very thick and hard to cut.  She denies any other acute complaints.  She would like to have me debride them down.    Percutaneous transluminal angioplasty of right peroneal artery with 2 mm diameter by 30 cm length angioplasty balloon 5.  Percutaneous transluminal angioplasty of the right posterior tibial artery with 2 mm diameter by 30 cm length angioplasty balloon in the mid and distal segments all the way into the foot, and a 2.5 mm diameter by 15 cm length angioplasty balloon to the proximal segment   Review of Systems: Negative except as noted in the HPI. Denies N/V/F/Ch.  Past Medical History:  Diagnosis Date  . Abdominal aortic atherosclerosis ([place]) 09/2015   by xray  . Anxiety   . BCC (basal cell carcinoma of skin) 05/2011   on nose  . Breast lesion    a. diagnosed with complex sclerosing lesion with calcifications of the right breast in 08/2016  . Chest pain    a. nuclear stress test 03/2012: normal; b. 09/2018 MV: EF >65%, no isch/scar.   . DDD (degenerative disc disease), lumbosacral 12/06/1992  . Gallstones 09/2015   incidentally by xray  . GERD (gastroesophageal reflux disease) 05/1999  . HERPES ZOSTER 04/13/2007   Qualifier:  Diagnosis of  By: [name], [name]   . Hiatal hernia   . History of shingles    ophthalmic, takes acyclovir daily  . Hyperlipidemia 12/2003  . Hypertension 1990  . Mitral regurgitation    a. echo 03/2012: EF 60-65%, no RWMA, mild to mod AI/MR, mod TR, PASP 37 mmHg  . Osteoarthritis 08/21/1989  . Osteoporosis with fracture 11/1997   with compression fracture T12  . Persistent atrial fibrillation ([place]) 03/16/2012   a. CHADS2VASc => 5 (HTN, age x 2, vascular disease, sex category)-->Eliquis 5 BID;  b. Recurrent AF in 06/2016;  c. 01/2017 s/p DCCV;  d. 02/2017 recurrent AFib->Amio added->DCCV; e. 03/2017 recurrent AFib, s/p reload of amio and DCCV 04/02/2017  . Rheumatoid arthritis ([place])   . RLS (restless legs syndrome)     Current Outpatient Medications:  .  acetaminophen (TYLENOL) 500 MG tablet, Take 1,000 mg every 8 (eight) hours as needed by mouth for moderate pain. , Disp: , Rfl:  .  acyclovir (ZOVIRAX) 400 MG tablet, Take 400 mg 2 (two) times daily by mouth. , Disp: , Rfl:  .  amiodarone (PACERONE) 100 MG tablet, Take 1 tablet (100 mg total) by mouth daily. (Patient taking differently: Take 100 mg by mouth in the morning and at bedtime. ), Disp: , Rfl:  .  aspirin EC 81 MG tablet, Take 1 tablet (81 mg total) by mouth daily., Disp: 150 tablet, Rfl:  2 .  atorvastatin (LIPITOR) 20 MG tablet, TAKE 1 TABLET BY MOUTH ONCE DAILY, Disp: 90 tablet, Rfl: 0 .  collagenase (SANTYL) ointment, Apply 1 application topically daily., Disp: 15 g, Rfl: 0 .  DULoxetine (CYMBALTA) 60 MG capsule, TAKE 1 CAPSULE BY MOUTH ONCE DAILY, Disp: 90 capsule, Rfl: 0 .  ELIQUIS 2.5 MG TABS tablet, TAKE ONE TABLET BY MOUTH TWICE DAILY (Patient taking differently: Take 2.5 mg by mouth 2 (two) times daily. ), Disp: 180 tablet, Rfl: 1 .  ergocalciferol (VITAMIN D2) 1.25 MG (50000 UT) capsule, Take 1 capsule (50,000 Units total) by mouth once a week. (Patient taking differently: Take 50,000 Units by mouth once a  week. Saturday), Disp: 12 capsule, Rfl: 3 .  furosemide (LASIX) 40 MG tablet, Take 1 tablet (40 mg total) by mouth daily., Disp: 30 tablet, Rfl: 5 .  gabapentin (NEURONTIN) 300 MG capsule, Take 1 capsule (300 mg total) by mouth at bedtime., Disp: , Rfl:  .  lisinopril (ZESTRIL) 10 MG tablet, Take 1 tablet (10 mg total) by mouth daily., Disp: 30 tablet, Rfl: 5 .  metoprolol tartrate (LOPRESSOR) 25 MG tablet, Take 0.5 tablets (12.5 mg total) by mouth 2 (two) times daily., Disp: 90 tablet, Rfl: 2 .  pantoprazole (PROTONIX) 40 MG tablet, TAKE 1 TABLET BY MOUTH EVERY DAY, Disp: 90 tablet, Rfl: 0 .  potassium chloride SA (KLOR-CON) 20 MEQ tablet, Take 0.5 tablets (10 mEq total) by mouth 2 (two) times daily., Disp: 30 tablet, Rfl: 6 .  prednisoLONE acetate (PRED FORTE) 1 % ophthalmic suspension, Place 1 drop into the left eye daily. , Disp: , Rfl:  .  rOPINIRole (REQUIP) 1 MG tablet, TAKE 2 TABLETS BY MOUTH AT BEDTIME (Patient taking differently: Take 2 mg by mouth at bedtime. ), Disp: 180 tablet, Rfl: 3 .  tiZANidine (ZANAFLEX) 2 MG tablet, Take 2 mg by mouth every evening. , Disp: , Rfl:  .  traMADol (ULTRAM) 50 MG tablet, Take 50 mg by mouth daily. , Disp: , Rfl: 0  Social History   Tobacco Use  Smoking Status Former Smoker  . Packs/day: 0.25  . Years: 1.00  . Pack years: 0.25  . Quit date: 04/08/1976  . Years since quitting: 43.4  Smokeless Tobacco Never Used  Tobacco Comment   pt smokes occasionally "socially"    Allergies  Allergen Reactions  . Penicillins Rash and Other (See Comments)    Childhood Allergy Has patient had a PCN reaction causing immediate rash, facial/tongue/throat swelling, SOB or lightheadedness with hypotension: Yes Has patient had a PCN reaction causing severe rash involving mucus membranes or skin necrosis: Unknown Has patient had a PCN reaction that required hospitalization: No Has patient had a PCN reaction occurring within the last 10 years: No If all of the  above answers are "NO", then may proceed with Cephalosporin use.    Objective:  There were no vitals filed for this visit. There is no height or weight on file to calculate BMI. Constitutional Well developed. Well nourished.  Vascular Dorsalis pedis pulses palpable bilaterally. Posterior tibial pulses palpable bilaterally. Capillary refill normal to all digits.  No cyanosis or clubbing noted. Pedal hair growth normal.  Neurologic Normal speech. Oriented to person, place, and time. Protective sensation absent  Dermatologic Wound Location: Right lateral side fifth digit stable ulcer with fat layer exposed.  Eschar had sloughed off. Wound Base: Fibrogranular.  No clinical signs of infection.  Does not probe down to bone.  No purulent drainage was  expressed. Nail Exam: Pt has thick disfigured discolored nails with subungual debris noted bilateral entire nail hallux through fifth toenails.  Pain on palpation to the nails. Peri-wound: Normal Exudate: None: wound tissue dry Wound Measurements: -See below  Orthopedic: No pain to palpation either foot.   Radiographs: We will plan on getting them next time. Assessment:   1. Peripheral vascular disease ([place])   2. Ulcerated, foot, right, with fat layer exposed ([place])    Plan:  Patient was evaluated and treated and all questions answered.  Ulcer right lateral fifth digit stable eschar~improving -Dressed with Santyl wet-to-dry, DSD. -Continue off-loading with surgical shoe. -Surgical shoe was dispensed -Angiogram shows improvement in circulation to the right lower extremity.  However I did explain to the patient that this does not mean that the wound itself will completely heal.  However given that she is clinically progressing in the right direction with improvement of pain and it appears that the eschar is more decreasing in depth.  I am hopeful that the wound will improve with time and local wound care.  If unable to do so we will consider  wound care management. -I have explained the patient to apply Betadine wet-to-dry dressing changes 3 times a week to keep it nice and dry. -If this worsens without good circulation to the right lower extremity patient is a high risk of major amputation versus minor toe amputation.  I discussed this with the patient in extensive detail. -I will plan on obtaining x-rays during next visit.  Procedure: Excisional Debridement of Wound fat layer exposed Tool: Sharp chisel blade/tissue nipper Rationale: Removal of non-viable soft tissue from the wound to promote healing.  Anesthesia: none Pre-Debridement Wound Measurements: 0.5 cm x 0.5 cm x 0.3 cm  Post-Debridement Wound Measurements: 0.6 cm x 0.6 cm x 0.3 cm Type of Debridement: Sharp Excisional Tissue Removed: Non-viable soft tissue Blood loss: Minimal (<50cc) Depth of Debridement: subcutaneous tissue. Technique: Sharp excisional debridement to bleeding, viable wound base.  Wound Progress: The eschar in the unstageable depth has sloughed off.  At this time I will continue to monitor the progression of the wound. Site healing conversation 7 Dressing: Dry, sterile, compression dressing. Disposition: Patient tolerated procedure well. Patient to return in 1 week for follow-up.  No follow-ups on file.       No follow-ups on file.    No follow-ups on file.

## 2019-09-16 ENCOUNTER — Other Ambulatory Visit: Payer: Self-pay | Admitting: Cardiovascular Disease

## 2019-09-16 DIAGNOSIS — B0233 Zoster keratitis: Secondary | ICD-10-CM | POA: Diagnosis not present

## 2019-09-21 ENCOUNTER — Ambulatory Visit (INDEPENDENT_AMBULATORY_CARE_PROVIDER_SITE_OTHER): Payer: Medicare Other

## 2019-09-21 ENCOUNTER — Encounter: Payer: Self-pay | Admitting: Podiatry

## 2019-09-21 ENCOUNTER — Other Ambulatory Visit: Payer: Self-pay

## 2019-09-21 ENCOUNTER — Telehealth: Payer: Self-pay

## 2019-09-21 ENCOUNTER — Ambulatory Visit (INDEPENDENT_AMBULATORY_CARE_PROVIDER_SITE_OTHER): Payer: Medicare Other | Admitting: Podiatry

## 2019-09-21 DIAGNOSIS — I739 Peripheral vascular disease, unspecified: Secondary | ICD-10-CM | POA: Diagnosis not present

## 2019-09-21 DIAGNOSIS — L97512 Non-pressure chronic ulcer of other part of right foot with fat layer exposed: Secondary | ICD-10-CM

## 2019-09-21 DIAGNOSIS — H9190 Unspecified hearing loss, unspecified ear: Secondary | ICD-10-CM

## 2019-09-21 NOTE — Telephone Encounter (Signed)
Audiology referral placed.

## 2019-09-21 NOTE — Telephone Encounter (Signed)
[place] Night - Client Nonclinical Telephone Record AccessNurse Client [facility] Night - Client Client Site [place] Physician Ria Bush - MD Contact Type Call Who Is Calling Patient / Member / Family / Caregiver Caller Name [name] Caller Phone Number [phone] Patient Name Kary Sugrue Patient DOB 11/09/1929 Call Type Message Only Information Provided Reason for Call Request to Schedule Office Appointment Initial Comment Caller states she needs to get her mom scheduled to get her B12 shot and she needs a referral for an audiologist. Additional Comment Provided office hours and advised to call back. Non symptomatic. Disp. Time Disposition Final User 09/20/2019 5:00:30 PM General Information Provided Yes Ovid Curd, [place] Call Closed By: Rolan Bucco Transaction Date/Time: 09/20/2019 4:56:59 PM (ET)

## 2019-09-21 NOTE — Telephone Encounter (Signed)
There was no answer with [name] (DPR signed) so left v/m for her to call [facility] to schedule B12 shot and will send to Dr Darnell Level for audiology referral. Pt last seen by Dr Danise Mina 08/13/19 for FU.

## 2019-09-22 ENCOUNTER — Encounter: Payer: Self-pay | Admitting: Podiatry

## 2019-09-22 ENCOUNTER — Telehealth: Payer: Self-pay

## 2019-09-22 DIAGNOSIS — I739 Peripheral vascular disease, unspecified: Secondary | ICD-10-CM

## 2019-09-22 DIAGNOSIS — L97512 Non-pressure chronic ulcer of other part of right foot with fat layer exposed: Secondary | ICD-10-CM

## 2019-09-22 NOTE — Telephone Encounter (Signed)
Referral has been entered in chart

## 2019-09-22 NOTE — Progress Notes (Signed)
Subjective:  Patient ID: [name], female    DOB: [birth year],  MRN: [id number]  Chief Complaint  Patient presents with  . Foot Ulcer    follow up ulcer right foot    86 y.o. female presents for wound care.  Patient presents with a follow-up of right lateral fifth eschar formation that appears to be improving considerably.  Patient wounds has been about the same.  Since the last visit.  Patient states that they have been doing Santyl wet-to-dry dressing changes which seems to be helping.  However the wound has not improved.  Upon my examination it appears that the wound is pretty stagnant.  Given poor circulation to the right lower extremity I am worried that patient is at a high risk of limb loss if this becomes an acute infection.    Percutaneous transluminal angioplasty of right peroneal artery with 2 mm diameter by 30 cm length angioplasty balloon 5.  Percutaneous transluminal angioplasty of the right posterior tibial artery with 2 mm diameter by 30 cm length angioplasty balloon in the mid and distal segments all the way into the foot, and a 2.5 mm diameter by 15 cm length angioplasty balloon to the proximal segment   Review of Systems: Negative except as noted in the HPI. Denies N/V/F/Ch.  Past Medical History:  Diagnosis Date  . Abdominal aortic atherosclerosis ([place]) 09/2015   by xray  . Anxiety   . BCC (basal cell carcinoma of skin) 05/2011   on nose  . Breast lesion    a. diagnosed with complex sclerosing lesion with calcifications of the right breast in 08/2016  . Chest pain    a. nuclear stress test 03/2012: normal; b. 09/2018 MV: EF >65%, no isch/scar.   . DDD (degenerative disc disease), lumbosacral 12/06/1992  . Gallstones 09/2015   incidentally by xray  . GERD (gastroesophageal reflux disease) 05/1999  . HERPES ZOSTER 04/13/2007   Qualifier: Diagnosis of  By: [name], [name]   . Hiatal hernia   . History of shingles    ophthalmic, takes  acyclovir daily  . Hyperlipidemia 12/2003  . Hypertension 1990  . Mitral regurgitation    a. echo 03/2012: EF 60-65%, no RWMA, mild to mod AI/MR, mod TR, PASP 37 mmHg  . Osteoarthritis 08/21/1989  . Osteoporosis with fracture 11/1997   with compression fracture T12  . Persistent atrial fibrillation ([place]) 03/16/2012   a. CHADS2VASc => 5 (HTN, age x 2, vascular disease, sex category)-->Eliquis 5 BID;  b. Recurrent AF in 06/2016;  c. 01/2017 s/p DCCV;  d. 02/2017 recurrent AFib->Amio added->DCCV; e. 03/2017 recurrent AFib, s/p reload of amio and DCCV 04/02/2017  . Rheumatoid arthritis ([place])   . RLS (restless legs syndrome)     Current Outpatient Medications:  .  acetaminophen (TYLENOL) 500 MG tablet, Take 1,000 mg every 8 (eight) hours as needed by mouth for moderate pain. , Disp: , Rfl:  .  acyclovir (ZOVIRAX) 400 MG tablet, Take 400 mg 2 (two) times daily by mouth. , Disp: , Rfl:  .  amiodarone (PACERONE) 100 MG tablet, Take 1 tablet (100 mg total) by mouth daily. (Patient taking differently: Take 100 mg by mouth in the morning and at bedtime. ), Disp: , Rfl:  .  aspirin EC 81 MG tablet, Take 1 tablet (81 mg total) by mouth daily., Disp: 150 tablet, Rfl: 2 .  atorvastatin (LIPITOR) 20 MG tablet, TAKE 1 TABLET BY MOUTH ONCE DAILY, Disp: 90 tablet, Rfl: 0 .  collagenase (SANTYL) ointment, Apply 1 application topically daily., Disp: 15 g, Rfl: 0 .  DULoxetine (CYMBALTA) 60 MG capsule, TAKE 1 CAPSULE BY MOUTH ONCE DAILY, Disp: 90 capsule, Rfl: 0 .  ELIQUIS 2.5 MG TABS tablet, TAKE ONE TABLET BY MOUTH TWICE DAILY (Patient taking differently: Take 2.5 mg by mouth 2 (two) times daily. ), Disp: 180 tablet, Rfl: 1 .  ergocalciferol (VITAMIN D2) 1.25 MG (50000 UT) capsule, Take 1 capsule (50,000 Units total) by mouth once a week. (Patient taking differently: Take 50,000 Units by mouth once a week. Saturday), Disp: 12 capsule, Rfl: 3 .  furosemide (LASIX) 40 MG tablet, Take 1 tablet (40 mg total) by mouth  daily., Disp: 30 tablet, Rfl: 5 .  gabapentin (NEURONTIN) 300 MG capsule, Take 1 capsule (300 mg total) by mouth at bedtime., Disp: , Rfl:  .  lisinopril (ZESTRIL) 10 MG tablet, Take 1 tablet (10 mg total) by mouth daily., Disp: 30 tablet, Rfl: 5 .  metoprolol tartrate (LOPRESSOR) 25 MG tablet, TAKE 1/2 TABLET BY MOUTH TWICE DAILY, Disp: 90 tablet, Rfl: 0 .  pantoprazole (PROTONIX) 40 MG tablet, TAKE 1 TABLET BY MOUTH EVERY DAY, Disp: 90 tablet, Rfl: 0 .  potassium chloride SA (KLOR-CON) 20 MEQ tablet, Take 0.5 tablets (10 mEq total) by mouth 2 (two) times daily., Disp: 30 tablet, Rfl: 6 .  prednisoLONE acetate (PRED FORTE) 1 % ophthalmic suspension, Place 1 drop into the left eye daily. , Disp: , Rfl:  .  rOPINIRole (REQUIP) 1 MG tablet, TAKE 2 TABLETS BY MOUTH AT BEDTIME (Patient taking differently: Take 2 mg by mouth at bedtime. ), Disp: 180 tablet, Rfl: 3 .  tiZANidine (ZANAFLEX) 2 MG tablet, Take 2 mg by mouth every evening. , Disp: , Rfl:  .  traMADol (ULTRAM) 50 MG tablet, Take 50 mg by mouth daily. , Disp: , Rfl: 0  Social History   Tobacco Use  Smoking Status Former Smoker  . Packs/day: 0.25  . Years: 1.00  . Pack years: 0.25  . Quit date: 04/08/1976  . Years since quitting: 43.4  Smokeless Tobacco Never Used  Tobacco Comment   pt smokes occasionally "socially"    Allergies  Allergen Reactions  . Penicillins Rash and Other (See Comments)    Childhood Allergy Has patient had a PCN reaction causing immediate rash, facial/tongue/throat swelling, SOB or lightheadedness with hypotension: Yes Has patient had a PCN reaction causing severe rash involving mucus membranes or skin necrosis: Unknown Has patient had a PCN reaction that required hospitalization: No Has patient had a PCN reaction occurring within the last 10 years: No If all of the above answers are "NO", then may proceed with Cephalosporin use.    Objective:  There were no vitals filed for this visit. There is no  height or weight on file to calculate BMI. Constitutional Well developed. Well nourished.  Vascular Dorsalis pedis pulses palpable bilaterally. Posterior tibial pulses palpable bilaterally. Capillary refill normal to all digits.  No cyanosis or clubbing noted. Pedal hair growth normal.  Neurologic Normal speech. Oriented to person, place, and time. Protective sensation absent  Dermatologic Wound Location: Right lateral side fifth digit stable ulcer with fat layer exposed.  Eschar had sloughed off. Wound Base: Fibrogranular.  No clinical signs of infection.  Does not probe down to bone.  No purulent drainage was expressed. Nail Exam: Pt has thick disfigured discolored nails with subungual debris noted bilateral entire nail hallux through fifth toenails.  Pain on palpation to the nails.  Peri-wound: Normal Exudate: None: wound tissue dry Wound Measurements: -See below  Orthopedic: No pain to palpation either foot.   Radiographs: 3 views of skeletally mature adult right foot: No cortical irregularity or any other signs of osteomyelitis noted.  No soft tissue emphysema noted.  No foreign body noted. Assessment:   1. Ulcerated, foot, right, with fat layer exposed ([place])   2. Peripheral vascular disease ([place])    Plan:  Patient was evaluated and treated and all questions answered.  Ulcer right lateral fifth digit stable eschar~stagnant -Dressed with Santyl wet-to-dry, DSD. -Continue off-loading with surgical shoe. -Surgical shoe was dispensed -Angiogram shows improvement in circulation to the right lower extremity.  However I did explain to the patient that this does not mean that the wound itself will completely heal.  However given that she is clinically progressing in the right direction with improvement of pain and it appears that the eschar is more decreasing in depth.  I am hopeful that the wound will improve with time and local wound care.  If unable to do so we will consider wound  care management. -I have explained the patient to apply Betadine wet-to-dry dressing changes 3 times a week to keep it nice and dry. -If this worsens without good circulation to the right lower extremity patient is a high risk of major amputation versus minor toe amputation.  I discussed this with the patient in extensive detail. -X-rays were reviewed with the patient it appears that there is no cortical irregularity or destruction consistent with osteomyelitis at this time. -I believe patient will benefit from a wound care center for further evaluation and management of the wound.  I believe they might be able to offer her more advanced wound care that I am able to in this clinic.  Given that patient has poor circulation she may benefit from hyperbaric oxygen therapy as well to increase the oxygen supply to the tissue.  Procedure: Excisional Debridement of Wound fat layer exposed stagnant Tool: Sharp chisel blade/tissue nipper Rationale: Removal of non-viable soft tissue from the wound to promote healing.  Anesthesia: none Pre-Debridement Wound Measurements: 0.5 cm x 0.5 cm x 0.3 cm  Post-Debridement Wound Measurements: 0.6 cm x 0.6 cm x 0.3 cm Type of Debridement: Sharp Excisional Tissue Removed: Non-viable soft tissue Blood loss: Minimal (<50cc) Depth of Debridement: subcutaneous tissue. Technique: Sharp excisional debridement to bleeding, viable wound base.  Wound Progress: The eschar in the unstageable depth has sloughed off.  At this time I will continue to monitor the progression of the wound. Site healing conversation 7 Dressing: Dry, sterile, compression dressing. Disposition: Patient tolerated procedure well. Patient to return in 1 week for follow-up.  No follow-ups on file.       No follow-ups on file.    No follow-ups on file.

## 2019-09-22 NOTE — Telephone Encounter (Signed)
-----   Message from Felipa Furnace, DPM sent at 09/22/2019  7:43 AM EDT ----- Regarding: Wound care center [place],  Can you refer this patient to the wound care center as there is a chronic wound that patient may benefit from hyperbarics or advanced wound care.  Thank you

## 2019-09-23 ENCOUNTER — Ambulatory Visit: Payer: Medicare Other | Admitting: Podiatry

## 2019-09-28 ENCOUNTER — Telehealth: Payer: Self-pay

## 2019-09-28 NOTE — Telephone Encounter (Signed)
Me to Ria Bush, MD     12:03 PM FYI. Pt is due for prolia after 6/23  Me     12:03 PM Note Pt is due for prolia after 6/23.     Aug 12, 2019     3:31 PM [name], [name] routed this conversation to Me  [name], CMA     3:31 PM Note Pt has [facility] MCR.  No PA rqd.  Pt would owe approx. $240 (20% for Prolia) and $0 for admin fee.

## 2019-09-28 NOTE — Telephone Encounter (Signed)
Pt is due for prolia after 6/24. Pt needs lab and then inj. LVM

## 2019-09-29 ENCOUNTER — Ambulatory Visit (INDEPENDENT_AMBULATORY_CARE_PROVIDER_SITE_OTHER): Payer: Medicare Other | Admitting: *Deleted

## 2019-09-29 DIAGNOSIS — E538 Deficiency of other specified B group vitamins: Secondary | ICD-10-CM

## 2019-09-29 MED ORDER — CYANOCOBALAMIN 1000 MCG/ML IJ SOLN
1000.0000 ug | Freq: Once | INTRAMUSCULAR | Status: AC
  Administered 2019-09-29: 1000 ug via INTRAMUSCULAR

## 2019-09-29 NOTE — Progress Notes (Signed)
Per orders of Dr. Gutierrez, injection of B12 given by Trea Carnegie M. Patient tolerated injection well. 

## 2019-09-29 NOTE — Telephone Encounter (Signed)
LVM on home number

## 2019-10-05 NOTE — Telephone Encounter (Signed)
Spoke with pt's daughter, [name], and scheduled pt for lab and B12 and OV with Dr. Darnell Level and she will also get her Prolia inj at her OV. Pt scheduled for lab on 7/27 and inj/OV on 8/10.

## 2019-10-08 ENCOUNTER — Other Ambulatory Visit: Payer: Self-pay | Admitting: Cardiovascular Disease

## 2019-10-08 DIAGNOSIS — I4819 Other persistent atrial fibrillation: Secondary | ICD-10-CM

## 2019-10-08 NOTE — Telephone Encounter (Signed)
Refill request

## 2019-10-12 ENCOUNTER — Other Ambulatory Visit: Payer: Self-pay

## 2019-10-12 ENCOUNTER — Other Ambulatory Visit
Admission: RE | Admit: 2019-10-12 | Discharge: 2019-10-12 | Disposition: A | Discharge status: Home / Self Care | Payer: Medicare Other | Source: Ambulatory Visit | Attending: Physician Assistant | Admitting: Physician Assistant

## 2019-10-12 ENCOUNTER — Encounter: Payer: Medicare Other | Attending: Physician Assistant | Admitting: Physician Assistant

## 2019-10-12 DIAGNOSIS — M1990 Unspecified osteoarthritis, unspecified site: Secondary | ICD-10-CM | POA: Insufficient documentation

## 2019-10-12 DIAGNOSIS — L089 Local infection of the skin and subcutaneous tissue, unspecified: Secondary | ICD-10-CM | POA: Insufficient documentation

## 2019-10-12 DIAGNOSIS — Z7901 Long term (current) use of anticoagulants: Secondary | ICD-10-CM | POA: Diagnosis not present

## 2019-10-12 DIAGNOSIS — I739 Peripheral vascular disease, unspecified: Secondary | ICD-10-CM | POA: Diagnosis not present

## 2019-10-12 DIAGNOSIS — I480 Paroxysmal atrial fibrillation: Secondary | ICD-10-CM | POA: Diagnosis not present

## 2019-10-12 DIAGNOSIS — L97512 Non-pressure chronic ulcer of other part of right foot with fat layer exposed: Secondary | ICD-10-CM | POA: Diagnosis not present

## 2019-10-12 DIAGNOSIS — L97511 Non-pressure chronic ulcer of other part of right foot limited to breakdown of skin: Secondary | ICD-10-CM | POA: Diagnosis not present

## 2019-10-12 DIAGNOSIS — I7389 Other specified peripheral vascular diseases: Secondary | ICD-10-CM | POA: Diagnosis not present

## 2019-10-12 NOTE — Telephone Encounter (Signed)
Last OV 07/28/19 Patient meets criteria for Eliquis 5mg  BID. It was noted in last OV (4/21) to continue lower dose Eliquis as patient is borderline weight (63kg)

## 2019-10-13 ENCOUNTER — Other Ambulatory Visit: Payer: Self-pay | Admitting: Physician Assistant

## 2019-10-13 ENCOUNTER — Other Ambulatory Visit: Payer: Self-pay | Admitting: Family Medicine

## 2019-10-13 DIAGNOSIS — L97512 Non-pressure chronic ulcer of other part of right foot with fat layer exposed: Secondary | ICD-10-CM

## 2019-10-14 DIAGNOSIS — H905 Unspecified sensorineural hearing loss: Secondary | ICD-10-CM | POA: Diagnosis not present

## 2019-10-14 NOTE — Progress Notes (Signed)
[name], [name] ([id number]) Visit Report for 10/12/2019 Chief Complaint Document Details Patient Name: [name], [name]. Date of Service: 10/12/2019 12:45 PM Medical Record Number: [id number] Patient Account Number: [id number] Date of Birth/Sex: June 28, 1929 (86 y.o. F) Treating RN: Cornell Barman Primary Care Provider: Ria Bush Other Clinician: Referring Provider: Boneta Lucks Treating Provider/Extender: Melburn Hake, Kortlyn Koltz Weeks in Treatment: 0 Information Obtained from: Patient Chief Complaint Right Lateral Foot Ulcer Electronic Signature(s) Signed: 10/12/2019 1:19:10 PM By: Worthy Keeler PA-C Entered By: Worthy Keeler on 10/12/2019 13:19:09 [name], [name] ([id number]) -------------------------------------------------------------------------------- Debridement Details Patient Name: [name] Date of Service: 10/12/2019 12:45 PM Medical Record Number: [id number] Patient Account Number: [id number] Date of Birth/Sex: 10-27-1929 (86 y.o. F) Treating RN: Army Melia Primary Care Provider: Ria Bush Other Clinician: Referring Provider: Boneta Lucks Treating Provider/Extender: Melburn Hake, Caresse Sedivy Weeks in Treatment: 0 Debridement Performed for Wound #3 Right Metatarsal head fifth Assessment: Performed By: Physician STONE III, Arrayah Connors E., PA-C Debridement Type: Debridement Severity of Tissue Pre Debridement: Limited to breakdown of skin Level of Consciousness (Pre- Awake and Alert procedure): Pre-procedure Verification/Time Out Yes - 13:25 Taken: Start Time: 13:25 Pain Control: Lidocaine Total Area Debrided (L x W): 1.2 (cm) x 1.2 (cm) = 1.44 (cm) Tissue and other material Viable, Non-Viable, Slough, Subcutaneous, Fibrin/Exudate, Slough debrided: Level: Skin/Subcutaneous Tissue Debridement Description: Excisional Instrument: Forceps, Scissors Specimen: Swab, Number of Specimens Taken: 1 Bleeding: Minimum Hemostasis Achieved: Pressure Response to Treatment:  Procedure was tolerated well Level of Consciousness (Post- Awake and Alert procedure): Post Debridement Measurements of Total Wound Length: (cm) 1.2 Width: (cm) 1.2 Depth: (cm) 0.1 Volume: (cm) 0.113 Character of Wound/Ulcer Post Debridement: Stable Severity of Tissue Post Debridement: Limited to breakdown of skin Post Procedure Diagnosis Same as Pre-procedure Electronic Signature(s) Signed: 10/12/2019 5:02:19 PM By: Worthy Keeler PA-C Signed: 10/14/2019 10:39:21 AM By: Army Melia Entered By: Army Melia on 10/12/2019 13:32:32 [name] ([id number]) -------------------------------------------------------------------------------- HPI Details Patient Name: [name]. Date of Service: 10/12/2019 12:45 PM Medical Record Number: [id number] Patient Account Number: [id number] Date of Birth/Sex: 21-Apr-1929 (86 y.o. F) Treating RN: Cornell Barman Primary Care Provider: Ria Bush Other Clinician: Referring Provider: Boneta Lucks Treating Provider/Extender: Melburn Hake, Violeta Lecount Weeks in Treatment: 0 History of Present Illness HPI Description: 10/12/2019 upon evaluation today patient appears to be doing somewhat poorly in regard to her wound at this point. She has a ulcer over the right fifth metatarsal region. This has been managed by podiatry up to this point. Unfortunately the patient just does not seem to be making the progress that they were hoping for an subsequently the patient was referred to [place] by [facility] Dr. Posey Pronto. With that being said I did note that the patient does have peripheral vascular disease which has been managed by Dr. Lazaro Arms her most recent angiogram with intervention was on 08/02/2019. The ABIs following were on the right 1.30 with a TBI of 0.61 normal left 1.31 with a TBI of 0.58. Obviously things seem to be doing somewhat better at this point hopefully enough to allow her to heal. She is on anticoagulant therapy due to atrial fibrillation  long-term. With that being said the patient is unfortunately having quite a bit of discomfort at this time secondary to the wound. She also has erythema which is very likely secondary to infection. I am going obtain a culture of the area today once debridement is complete. Apparently the patient also had a x-ray that was performed by  Dr. Posey Pronto on 09/21/2019 and this showed that the patient had no obvious signs on x-ray of cortical irregularity and no osteomyelitis obviously noted. With that being said as I discussed with the patient's daughter as well this does not indicate that the patient absolutely has no osteomyelitis as x-rays are only at best 50% helpful in identifying this complication. Nonetheless I do believe that we need to have the patient continue to have this further evaluated specifically I am to recommend that we check into an MRI which I think would be ideal to evaluate whether or not there is osteomyelitis present. Electronic Signature(s) Signed: 10/12/2019 4:56:17 PM By: Worthy Keeler PA-C Entered By: Worthy Keeler on 10/12/2019 16:56:17 [name] ([id number]) -------------------------------------------------------------------------------- Physical Exam Details Patient Name: [name], [name]. Date of Service: 10/12/2019 12:45 PM Medical Record Number: [id number] Patient Account Number: [id number] Date of Birth/Sex: 06-16-1929 (86 y.o. F) Treating RN: Cornell Barman Primary Care Provider: Ria Bush Other Clinician: Referring Provider: Boneta Lucks Treating Provider/Extender: Melburn Hake, Fynn Adel Weeks in Treatment: 0 Constitutional patient is hypertensive.. pulse regular and within target range for patient.Marland Kitchen respirations regular, non-labored and within target range for patient.Marland Kitchen temperature within target range for patient.. Well-nourished and well-hydrated in no acute distress. Eyes conjunctiva clear no eyelid edema noted. pupils equal round and reactive to light and  accommodation. Ears, Nose, Mouth, and Throat no gross abnormality of ear auricles or external auditory canals. normal hearing noted during conversation. mucus membranes moist. Respiratory normal breathing without difficulty. Cardiovascular 1+ dorsalis pedis/posterior tibialis pulses. no clubbing, cyanosis, significant edema, <3 sec cap refill. Musculoskeletal Patient unable to walk without assistance. Psychiatric this patient is able to make decisions and demonstrates good insight into disease process. Alert and Oriented x 3. pleasant and cooperative. Notes Upon inspection patient's wound bed actually did require sharp debridement to clear away some of the necrotic debris today. The patient tolerated this debridement with some discomfort I was trying to be very cautious and careful in this regard. Nonetheless I was able to remove a good amount of necrotic tissue not down to good tissue by any means but this is at least to start. Subsequently post debridement I did perform a culture today to evaluate for what may be causing the infection that appears apparent today. Electronic Signature(s) Signed: 10/12/2019 4:57:08 PM By: Worthy Keeler PA-C Entered By: Worthy Keeler on 10/12/2019 16:57:07 [name] ([id number]) -------------------------------------------------------------------------------- Physician Orders Details Patient Name: [name] Date of Service: 10/12/2019 12:45 PM Medical Record Number: [id number] Patient Account Number: [id number] Date of Birth/Sex: [birth year] (86 y.o. F) Treating RN: Army Melia Primary Care Provider: Ria Bush Other Clinician: Referring Provider: Boneta Lucks Treating Provider/Extender: Melburn Hake, Earland Reish Weeks in Treatment: 0 Verbal / Phone Orders: No Diagnosis Coding ICD-10 Coding Code Description I73.89 Other specified peripheral vascular diseases L97.512 Non-pressure chronic ulcer of other part of right foot with fat layer  exposed Z79.01 Long term (current) use of anticoagulants I48.0 Paroxysmal atrial fibrillation Wound Cleansing Wound #3 Right Metatarsal head fifth o Clean wound with Normal Saline. Anesthetic (add to Medication List) Wound #3 Right Metatarsal head fifth o Topical Lidocaine 4% cream applied to wound bed prior to debridement (In Clinic Only). Primary Wound Dressing Wound #3 Right Metatarsal head fifth o Santyl Ointment Secondary Dressing o Other - coverlet Wound #3 Right Metatarsal head fifth o Saline moistened gauze Dressing Change Frequency Wound #3 Right Metatarsal head fifth o Change dressing every day. Follow-up Appointments Wound #3 Right  Metatarsal head fifth o Return Appointment in 1 week. Edema Control Wound #3 Right Metatarsal head fifth o Elevate legs to the level of the heart and pump ankles as often as possible Off-Loading Wound #3 Right Metatarsal head fifth o Other: - Keep pressure off of wound Medications-please add to medication list. Wound #3 Right Metatarsal head fifth o P.O. Antibiotics o Santyl Enzymatic Ointment Laboratory ALEXIZ, COTHRAN ([id number]) o Bacteria identified in Wound by Culture (MICRO) - right foot oooo LOINC Code: 9562-1 oooo Convenience Name: Wound culture routine Radiology o Magnetic Resonance Imaging (MRI) - Right foot with and without contrast. Right 5th distal metatarsal ulcer - (ICD10 L97.512 - Non- pressure chronic ulcer of other part of right foot with fat layer exposed) Patient Medications Allergies: penicillin Notifications Medication Indication Start End Bactrim DS 10/12/2019 DOSE 1 - oral 800 mg-160 mg tablet - 1 tablet oral taken 2 times per day for 14 days. Do not potassium while on this medication Electronic Signature(s) Signed: 10/12/2019 6:18:36 PM By: Gretta Cool, BSN, RN, CWS, Kim RN, BSN Signed: 10/14/2019 4:55:37 PM By: Worthy Keeler PA-C Previous Signature: 10/12/2019 5:02:19 PM Version By: Worthy Keeler PA-C Previous Signature: 10/12/2019 1:47:58 PM Version By: Worthy Keeler PA-C Entered By: Gretta Cool BSN, RN, CWS, Kim on 10/12/2019 18:03:29 [name], [name] ([id number]) -------------------------------------------------------------------------------- Problem List Details Patient Name: REGINNA, SERMENO. Date of Service: 10/12/2019 12:45 PM Medical Record Number: [id number] Patient Account Number: [id number] Date of Birth/Sex: [birth year] (86 y.o. F) Treating RN: Cornell Barman Primary Care Provider: Ria Bush Other Clinician: Referring Provider: Boneta Lucks Treating Provider/Extender: Melburn Hake, Murlin Schrieber Weeks in Treatment: 0 Active Problems ICD-10 Encounter Code Description Active Date MDM Diagnosis I73.89 Other specified peripheral vascular diseases 10/12/2019 No Yes L97.512 Non-pressure chronic ulcer of other part of right foot with fat layer 10/12/2019 No Yes exposed Z79.01 Long term (current) use of anticoagulants 10/12/2019 No Yes I48.0 Paroxysmal atrial fibrillation 10/12/2019 No Yes Inactive Problems Resolved Problems Electronic Signature(s) Signed: 10/12/2019 1:18:54 PM By: Worthy Keeler PA-C Entered By: Worthy Keeler on 10/12/2019 13:18:54 [name] ([id number]) -------------------------------------------------------------------------------- Progress Note Details Patient Name: [name]. Date of Service: 10/12/2019 12:45 PM Medical Record Number: [id number] Patient Account Number: [id number] Date of Birth/Sex: 1929-06-08 (86 y.o. F) Treating RN: Cornell Barman Primary Care Provider: Ria Bush Other Clinician: Referring Provider: Boneta Lucks Treating Provider/Extender: Melburn Hake, Laekyn Rayos Weeks in Treatment: 0 Subjective Chief Complaint Information obtained from Patient Right Lateral Foot Ulcer History of Present Illness (HPI) 10/12/2019 upon evaluation today patient appears to be doing somewhat poorly in regard to her wound at this point. She has a ulcer  over the right fifth metatarsal region. This has been managed by podiatry up to this point. Unfortunately the patient just does not seem to be making the progress that they were hoping for an subsequently the patient was referred to [place] by [facility] Dr. Posey Pronto. With that being said I did note that the patient does have peripheral vascular disease which has been managed by Dr. Lazaro Arms her most recent angiogram with intervention was on 08/02/2019. The ABIs following were on the right 1.30 with a TBI of 0.61 normal left 1.31 with a TBI of 0.58. Obviously things seem to be doing somewhat better at this point hopefully enough to allow her to heal. She is on anticoagulant therapy due to atrial fibrillation long-term. With that being said the patient is unfortunately having quite a bit of discomfort at this  time secondary to the wound. She also has erythema which is very likely secondary to infection. I am going obtain a culture of the area today once debridement is complete. Apparently the patient also had a x-ray that was performed by Dr. Posey Pronto on 09/21/2019 and this showed that the patient had no obvious signs on x-ray of cortical irregularity and no osteomyelitis obviously noted. With that being said as I discussed with the patient's daughter as well this does not indicate that the patient absolutely has no osteomyelitis as x-rays are only at best 50% helpful in identifying this complication. Nonetheless I do believe that we need to have the patient continue to have this further evaluated specifically I am to recommend that we check into an MRI which I think would be ideal to evaluate whether or not there is osteomyelitis present. Patient History Information obtained from Patient. Allergies penicillin Family History Diabetes - Siblings, Heart Disease - Siblings, Kidney Disease - Child, Seizures - Child, Stroke - Siblings, No family history of Cancer, Hypertension, Lung Disease, Thyroid  Problems. Social History Former smoker, Marital Status - Widowed, Alcohol Use - Daily, Drug Use - No History, Caffeine Use - Daily. Medical History Eyes Denies history of Cataracts, Glaucoma, Optic Neuritis Ear/Nose/Mouth/Throat Denies history of Chronic sinus problems/congestion, Middle ear problems Hematologic/Lymphatic Denies history of Anemia, Hemophilia, Human Immunodeficiency Virus, Lymphedema, Sickle Cell Disease Respiratory Denies history of Aspiration, Asthma, Chronic Obstructive Pulmonary Disease (COPD), Pneumothorax, Sleep Apnea, Tuberculosis Cardiovascular Patient has history of Arrhythmia - A Fib Denies history of Angina, Congestive Heart Failure, Coronary Artery Disease, Deep Vein Thrombosis, Hypertension, Hypotension, Myocardial Infarction, Peripheral Arterial Disease, Peripheral Venous Disease, Phlebitis, Vasculitis Gastrointestinal Denies history of Cirrhosis , Colitis, Crohn s, Hepatitis A, Hepatitis B, Hepatitis C Endocrine Denies history of Type I Diabetes, Type II Diabetes Genitourinary Denies history of End Stage Renal Disease Immunological Denies history of Lupus Erythematosus, Raynaud s, Scleroderma Integumentary (Skin) Denies history of History of Burn, History of pressure wounds Musculoskeletal Patient has history of Osteoarthritis Denies history of Gout, Rheumatoid Arthritis, Osteomyelitis Neurologic Denies history of Dementia, Neuropathy, Quadriplegia, Paraplegia, Seizure Disorder Oncologic Denies history of Received Chemotherapy, Received Radiation Psychiatric [name], [name] ([id number]) Denies history of Anorexia/bulimia, Confinement Anxiety Medical And Surgical History Notes Constitutional Symptoms (General Health) HTN; Acid Reflux; A-fib; Neck surgery; Cardiovascular A-fib Review of Systems (ROS) Constitutional Symptoms (General Health) Denies complaints or symptoms of Fatigue, Fever, Chills, Marked Weight Change. Eyes Denies complaints  or symptoms of Dry Eyes, Vision Changes, Glasses / Contacts. Ear/Nose/Mouth/Throat Denies complaints or symptoms of Difficult clearing ears, Sinusitis. Hematologic/Lymphatic Denies complaints or symptoms of Bleeding / Clotting Disorders, Human Immunodeficiency Virus. Respiratory Denies complaints or symptoms of Chronic or frequent coughs, Shortness of Breath. Cardiovascular Denies complaints or symptoms of Chest pain, LE edema. Gastrointestinal Denies complaints or symptoms of Frequent diarrhea, Nausea, Vomiting. Endocrine Denies complaints or symptoms of Hepatitis, Thyroid disease, Polydypsia (Excessive Thirst). Genitourinary Denies complaints or symptoms of Kidney failure/ Dialysis, Incontinence/dribbling. Immunological Denies complaints or symptoms of Hives, Itching. Integumentary (Skin) Complains or has symptoms of Wounds. Denies complaints or symptoms of Bleeding or bruising tendency, Breakdown, Swelling. Musculoskeletal Denies complaints or symptoms of Muscle Pain, Muscle Weakness. Neurologic Denies complaints or symptoms of Numbness/parasthesias, Focal/Weakness. Psychiatric Denies complaints or symptoms of Anxiety, Claustrophobia. Objective Constitutional patient is hypertensive.. pulse regular and within target range for patient.Marland Kitchen respirations regular, non-labored and within target range for patient.Marland Kitchen temperature within target range for patient.. Well-nourished and well-hydrated in no acute distress. Vitals Time Taken:  1:00 PM, Temperature: 98.2 F, Pulse: 74 bpm, Respiratory Rate: 18 breaths/min, Blood Pressure: 152/89 mmHg. Eyes conjunctiva clear no eyelid edema noted. pupils equal round and reactive to light and accommodation. Ears, Nose, Mouth, and Throat no gross abnormality of ear auricles or external auditory canals. normal hearing noted during conversation. mucus membranes moist. Respiratory normal breathing without difficulty. Cardiovascular 1+ dorsalis  pedis/posterior tibialis pulses. no clubbing, cyanosis, significant edema, Musculoskeletal Patient unable to walk without assistance. Psychiatric this patient is able to make decisions and demonstrates good insight into disease process. Alert and Oriented x 3. pleasant and cooperative. General Notes: Upon inspection patient's wound bed actually did require sharp debridement to clear away some of the necrotic debris today. The patient tolerated this debridement with some discomfort I was trying to be very cautious and careful in this regard. Nonetheless I was able to remove a good amount of necrotic tissue not down to good tissue by any means but this is at least to start. Subsequently post debridement I did perform a culture today to evaluate for what may be causing the infection that appears apparent today. [name], [name] ([id number]) Integumentary (Hair, Skin) Wound #3 status is Open. Original cause of wound was Gradually Appeared. The wound is located on the Right Metatarsal head fifth. The wound measures 1.2cm length x 1.2cm width x 0.1cm depth; 1.131cm^2 area and 0.113cm^3 volume. There is Fat Layer (Subcutaneous Tissue) Exposed exposed. There is no tunneling or undermining noted. There is a medium amount of serosanguineous drainage noted. The wound margin is distinct with the outline attached to the wound base. There is small (1-33%) red granulation within the wound bed. There is a large (67-100%) amount of necrotic tissue within the wound bed including Adherent Slough. Assessment Active Problems ICD-10 Other specified peripheral vascular diseases Non-pressure chronic ulcer of other part of right foot with fat layer exposed Long term (current) use of anticoagulants Paroxysmal atrial fibrillation Procedures Wound #3 Pre-procedure diagnosis of Wound #3 is an Arterial Insufficiency Ulcer located on the Right Metatarsal head fifth .Severity of Tissue Pre Debridement is: Limited to  breakdown of skin. There was a Excisional Skin/Subcutaneous Tissue Debridement with a total area of 1.44 sq cm performed by STONE III, Kaleigha Chamberlin E., PA-C. With the following instrument(s): Forceps, and Scissors to remove Viable and Non-Viable tissue/material. Material removed includes Subcutaneous Tissue, Slough, and Fibrin/Exudate after achieving pain control using Lidocaine. 1 specimen was taken by a Swab and sent to the lab per facility protocol. A time out was conducted at 13:25, prior to the start of the procedure. A Minimum amount of bleeding was controlled with Pressure. The procedure was tolerated well. Post Debridement Measurements: 1.2cm length x 1.2cm width x 0.1cm depth; 0.113cm^3 volume. Character of Wound/Ulcer Post Debridement is stable. Severity of Tissue Post Debridement is: Limited to breakdown of skin. Post procedure Diagnosis Wound #3: Same as Pre-Procedure Plan Wound Cleansing: Wound #3 Right Metatarsal head fifth: Clean wound with Normal Saline. Anesthetic (add to Medication List): Wound #3 Right Metatarsal head fifth: Topical Lidocaine 4% cream applied to wound bed prior to debridement (In Clinic Only). Primary Wound Dressing: Wound #3 Right Metatarsal head fifth: Santyl Ointment Secondary Dressing: Other - coverlet Wound #3 Right Metatarsal head fifth: Saline moistened gauze Dressing Change Frequency: Wound #3 Right Metatarsal head fifth: Change dressing every day. Follow-up Appointments: Wound #3 Right Metatarsal head fifth: Return Appointment in 1 week. Edema Control: Wound #3 Right Metatarsal head fifth: Elevate legs to the level of the heart  and pump ankles as often as possible Off-Loading: Wound #3 Right Metatarsal head fifth: Other: - Keep pressure off of wound Medications-please add to medication list.: Wound #3 Right Metatarsal head fifth: P.O. Antibiotics Santyl Enzymatic Ointment [name], [name] ([id number]) Radiology ordered were: Magnetic  Resonance Imaging (MRI) - Right foot with and without contrast. Right 5th distal metatarsal ulcer The following medication(s) was prescribed: Bactrim DS oral 800 mg-160 mg tablet 1 1 tablet oral taken 2 times per day for 14 days. Do not potassium while on this medication starting 10/12/2019 1. I would go ahead and send in a wound culture for the patient which they are in agreement with today. 2. I am also can recommend that we go ahead and initiate antibiotic therapy today specifically with regard to Bactrim DS which I need to send in for her. 3. I am also can recommend based on the review the x-ray and discussion with the daughter as well that we proceed with an MRI of the right foot in order to evaluate for any signs of osteomyelitis that may have not been picked up by the x-ray alone. 4. I am also going to recommend that we continue with the Santyl which I believe is a good option for the patient currently. Coupled with serial debridements I think this is the way able to get this cleaned and hopefully healed. We will see patient back for reevaluation in 1 week here in the clinic. If anything worsens or changes patient will contact our office for additional recommendations. Electronic Signature(s) Signed: 10/12/2019 4:58:10 PM By: Worthy Keeler PA-C Entered By: Worthy Keeler on 10/12/2019 16:58:10 [name], [name] ([id number]) -------------------------------------------------------------------------------- ROS/PFSH Details Patient Name: [name] Date of Service: 10/12/2019 12:45 PM Medical Record Number: [id number] Patient Account Number: [id number] Date of Birth/Sex: 08-17-1929 (86 y.o. F) Treating RN: Army Melia Primary Care Provider: Ria Bush Other Clinician: Referring Provider: Boneta Lucks Treating Provider/Extender: Melburn Hake, Brylee Mcgreal Weeks in Treatment: 0 Information Obtained From Patient Constitutional Symptoms (General Health) Complaints and Symptoms: Negative  for: Fatigue; Fever; Chills; Marked Weight Change Medical History: Past Medical History Notes: HTN; Acid Reflux; A-fib; Neck surgery; Eyes Complaints and Symptoms: Negative for: Dry Eyes; Vision Changes; Glasses / Contacts Medical History: Negative for: Cataracts; Glaucoma; Optic Neuritis Ear/Nose/Mouth/Throat Complaints and Symptoms: Negative for: Difficult clearing ears; Sinusitis Medical History: Negative for: Chronic sinus problems/congestion; Middle ear problems Hematologic/Lymphatic Complaints and Symptoms: Negative for: Bleeding / Clotting Disorders; Human Immunodeficiency Virus Medical History: Negative for: Anemia; Hemophilia; Human Immunodeficiency Virus; Lymphedema; Sickle Cell Disease Respiratory Complaints and Symptoms: Negative for: Chronic or frequent coughs; Shortness of Breath Medical History: Negative for: Aspiration; Asthma; Chronic Obstructive Pulmonary Disease (COPD); Pneumothorax; Sleep Apnea; Tuberculosis Cardiovascular Complaints and Symptoms: Negative for: Chest pain; LE edema Medical History: Positive for: Arrhythmia - A Fib Negative for: Angina; Congestive Heart Failure; Coronary Artery Disease; Deep Vein Thrombosis; Hypertension; Hypotension; Myocardial Infarction; Peripheral Arterial Disease; Peripheral Venous Disease; Phlebitis; Vasculitis Past Medical History Notes: A-fib Gastrointestinal Complaints and Symptoms: Negative for: Frequent diarrhea; Nausea; Vomiting Medical HistoryPATT, [name] ([id number]) Negative for: Cirrhosis ; Colitis; Crohnos; Hepatitis A; Hepatitis B; Hepatitis C Endocrine Complaints and Symptoms: Negative for: Hepatitis; Thyroid disease; Polydypsia (Excessive Thirst) Medical History: Negative for: Type I Diabetes; Type II Diabetes Genitourinary Complaints and Symptoms: Negative for: Kidney failure/ Dialysis; Incontinence/dribbling Medical History: Negative for: End Stage Renal Disease Immunological Complaints  and Symptoms: Negative for: Hives; Itching Medical History: Negative for: Lupus Erythematosus; Raynaudos; Scleroderma Integumentary (Skin) Complaints  and Symptoms: Positive for: Wounds Negative for: Bleeding or bruising tendency; Breakdown; Swelling Medical History: Negative for: History of Burn; History of pressure wounds Musculoskeletal Complaints and Symptoms: Negative for: Muscle Pain; Muscle Weakness Medical History: Positive for: Osteoarthritis Negative for: Gout; Rheumatoid Arthritis; Osteomyelitis Neurologic Complaints and Symptoms: Negative for: Numbness/parasthesias; Focal/Weakness Medical History: Negative for: Dementia; Neuropathy; Quadriplegia; Paraplegia; Seizure Disorder Psychiatric Complaints and Symptoms: Negative for: Anxiety; Claustrophobia Medical History: Negative for: Anorexia/bulimia; Confinement Anxiety Oncologic Medical History: Negative for: Received Chemotherapy; Received Radiation Immunizations Pneumococcal Vaccine: Received Pneumococcal Vaccination: No [name], [name] ([id number]) Implantable Devices None Family and Social History Cancer: No; Diabetes: Yes - Siblings; Heart Disease: Yes - Siblings; Hypertension: No; Kidney Disease: Yes - Child; Lung Disease: No; Seizures: Yes - Child; Stroke: Yes - Siblings; Thyroid Problems: No; Former smoker; Marital Status - Widowed; Alcohol Use: Daily; Drug Use: No History; Caffeine Use: Daily Electronic Signature(s) Signed: 10/12/2019 5:02:19 PM By: Worthy Keeler PA-C Signed: 10/14/2019 10:39:21 AM By: Army Melia Entered By: Army Melia on 10/12/2019 13:06:47 [name] ([id number]) -------------------------------------------------------------------------------- SuperBill Details Patient Name: [name]. Date of Service: 10/12/2019 Medical Record Number: [id number] Patient Account Number: [id number] Date of Birth/Sex: January 12, 1930 (86 y.o. F) Treating RN: Cornell Barman Primary Care Provider:  Ria Bush Other Clinician: Referring Provider: Boneta Lucks Treating Provider/Extender: Melburn Hake, Kumari Sculley Weeks in Treatment: 0 Diagnosis Coding ICD-10 Codes Code Description I73.89 Other specified peripheral vascular diseases L97.512 Non-pressure chronic ulcer of other part of right foot with fat layer exposed Z79.01 Long term (current) use of anticoagulants I48.0 Paroxysmal atrial fibrillation Facility Procedures CPT4 Code: 34917915 Description: 99213 - WOUND CARE VISIT-LEV 3 EST PT Modifier: Quantity: 1 CPT4 Code: 05697948 Description: 01655 - DEB SUBQ TISSUE 20 SQ CM/< Modifier: Quantity: 1 CPT4 Code: Description: ICD-10 Diagnosis Description L97.512 Non-pressure chronic ulcer of other part of right foot with fat layer ex Modifier: posed Quantity: Physician Procedures CPT4 Code: 3748270 Description: 78675 - WC PHYS LEVEL 4 - NEW PT Modifier: 25 Quantity: 1 CPT4 Code: Description: ICD-10 Diagnosis Description I73.89 Other specified peripheral vascular diseases L97.512 Non-pressure chronic ulcer of other part of right foot with fat layer ex Z79.01 Long term (current) use of anticoagulants I48.0 Paroxysmal atrial  fibrillation Modifier: posed Quantity: CPT4 Code: 4492010 Description: 07121 - WC PHYS SUBQ TISS 20 SQ CM Modifier: Quantity: 1 CPT4 Code: Description: ICD-10 Diagnosis Description L97.512 Non-pressure chronic ulcer of other part of right foot with fat layer ex Modifier: posed Quantity: Electronic Signature(s) Signed: 10/12/2019 4:58:34 PM By: Worthy Keeler PA-C Entered By: Worthy Keeler on 10/12/2019 16:58:33

## 2019-10-14 NOTE — Progress Notes (Signed)
LAVETTA, GEIER ([id number]) Visit Report for 10/12/2019 Abuse/Suicide Risk Screen Details Patient Name: [name], [name]. Date of Service: 10/12/2019 12:45 PM Medical Record Number: [id number] Patient Account Number: [id number] Date of Birth/Sex: 20-Nov-1929 (86 y.o. F) Treating RN: Army Melia Primary Care Elliannah Wayment: Ria Bush Other Clinician: Referring Bruin Bolger: Boneta Lucks Treating Deveion Denz/Extender: Melburn Hake, HOYT Weeks in Treatment: 0 Abuse/Suicide Risk Screen Items Answer ABUSE RISK SCREEN: Has anyone close to you tried to hurt or harm you recentlyo No Do you feel uncomfortable with anyone in your familyo No Has anyone forced you do things that you didnot want to doo No Electronic Signature(s) Signed: 10/14/2019 10:39:21 AM By: Army Melia Entered By: Army Melia on 10/12/2019 13:06:53 [name] ([id number]) -------------------------------------------------------------------------------- Activities of Daily Living Details Patient Name: [name]. Date of Service: 10/12/2019 12:45 PM Medical Record Number: [id number] Patient Account Number: [id number] Date of Birth/Sex: [birth year] (86 y.o. F) Treating RN: Army Melia Primary Care Xue Low: Ria Bush Other Clinician: Referring Bracy Pepper: Boneta Lucks Treating Shaye Elling/Extender: Melburn Hake, HOYT Weeks in Treatment: 0 Activities of Daily Living Items Answer Activities of Daily Living (Please select one for each item) Drive Automobile Not Able Take Medications Completely Able Use Telephone Completely Able Care for Appearance Completely Able Use Toilet Completely Able Bath / Shower Completely Able Dress Self Completely Able Feed Self Completely Able Walk Need Assistance Get In / Out Bed Completely Able Housework Completely Able Prepare Meals Completely Vance for Self Completely Able Electronic Signature(s) Signed: 10/14/2019 10:39:21 AM By: Army Melia Entered  By: Army Melia on 10/12/2019 13:07:20 [name] ([id number]) -------------------------------------------------------------------------------- Education Screening Details Patient Name: [name]. Date of Service: 10/12/2019 12:45 PM Medical Record Number: [id number] Patient Account Number: [id number] Date of Birth/Sex: 1929-06-23 (86 y.o. F) Treating RN: Army Melia Primary Care Laporchia Nakajima: Ria Bush Other Clinician: Referring Yamilette Garretson: Boneta Lucks Treating Deneene Tarver/Extender: Sharalyn Ink in Treatment: 0 Primary Learner Assessed: Patient Learning Preferences/Education Level/Primary Language Learning Preference: Explanation Highest Education Level: High School Preferred Language: English Cognitive Barrier Language Barrier: No Translator Needed: No Memory Deficit: No Emotional Barrier: No Cultural/Religious Beliefs Affecting Medical Care: No Physical Barrier Impaired Vision: No Impaired Hearing: No Decreased Hand dexterity: No Knowledge/Comprehension Knowledge Level: High Comprehension Level: High Ability to understand written instructions: High Ability to understand verbal instructions: High Motivation Anxiety Level: Calm Cooperation: Cooperative Education Importance: Acknowledges Need Interest in Health Problems: Asks Questions Perception: Coherent Willingness to Engage in Self-Management High Activities: Readiness to Engage in Self-Management High Activities: Electronic Signature(s) Signed: 10/14/2019 10:39:21 AM By: Army Melia Entered By: Army Melia on 10/12/2019 13:08:02 [name] ([id number]) -------------------------------------------------------------------------------- Fall Risk Assessment Details Patient Name: [name]. Date of Service: 10/12/2019 12:45 PM Medical Record Number: [id number] Patient Account Number: [id number] Date of Birth/Sex: 28-Aug-1929 (86 y.o. F) Treating RN: Army Melia Primary Care Ehan Freas:  Ria Bush Other Clinician: Referring Havoc Sanluis: Boneta Lucks Treating Whittley Carandang/Extender: Melburn Hake, HOYT Weeks in Treatment: 0 Fall Risk Assessment Items Have you had 2 or more falls in the last 12 monthso 0 No Have you had any fall that resulted in injury in the last 12 monthso 0 No FALLS RISK SCREEN History of falling - immediate or within 3 months 0 No Secondary diagnosis (Do you have 2 or more medical diagnoseso) 0 No Ambulatory aid None/bed rest/wheelchair/nurse 0 No Crutches/cane/walker 15 Yes Furniture 0 No Intravenous therapy Access/Saline/Heparin Lock 0 No Gait/Transferring Normal/ bed rest/ wheelchair 0 No Weak (short steps with  or without shuffle, stooped but able to lift head while walking, may 10 Yes seek support from furniture) Impaired (short steps with shuffle, may have difficulty arising from chair, head down, impaired 0 No balance) Mental Status Oriented to own ability 0 No Electronic Signature(s) Signed: 10/14/2019 10:39:21 AM By: Army Melia Entered By: Army Melia on 10/12/2019 13:08:17 [name] ([id number]) -------------------------------------------------------------------------------- Foot Assessment Details Patient Name: [name]. Date of Service: 10/12/2019 12:45 PM Medical Record Number: [id number] Patient Account Number: [id number] Date of Birth/Sex: 08-20-1929 (86 y.o. F) Treating RN: Army Melia Primary Care Mithcell Schumpert: Ria Bush Other Clinician: Referring Kennth Vanbenschoten: Boneta Lucks Treating Wandell Scullion/Extender: Melburn Hake, HOYT Weeks in Treatment: 0 Foot Assessment Items Site Locations + = Sensation present, - = Sensation absent, C = Callus, U = Ulcer R = Redness, W = Warmth, M = Maceration, PU = Pre-ulcerative lesion F = Fissure, S = Swelling, D = Dryness Assessment Right: Left: Other Deformity: No No Prior Foot Ulcer: No No Prior Amputation: No No Charcot Joint: No No Ambulatory Status: Ambulatory With  Help Assistance Device: Walker Gait: Steady Electronic Signature(s) Signed: 10/14/2019 10:39:21 AM By: Army Melia Entered By: Army Melia on 10/12/2019 13:09:54 [name] ([id number]) -------------------------------------------------------------------------------- Nutrition Risk Screening Details Patient Name: [name]. Date of Service: 10/12/2019 12:45 PM Medical Record Number: [id number] Patient Account Number: [id number] Date of Birth/Sex: [birth year] (86 y.o. F) Treating RN: Army Melia Primary Care Cylan Borum: Ria Bush Other Clinician: Referring Meha Vidrine: Boneta Lucks Treating Jana Swartzlander/Extender: Melburn Hake, HOYT Weeks in Treatment: 0 Height (in): Weight (lbs): Body Mass Index (BMI): Nutrition Risk Screening Items Score Screening NUTRITION RISK SCREEN: I have an illness or condition that made me change the kind and/or amount of food I eat 0 No I eat fewer than two meals per day 0 No I eat few fruits and vegetables, or milk products 0 No I have three or more drinks of beer, liquor or wine almost every day 0 No I have tooth or mouth problems that make it hard for me to eat 0 No I don't always have enough money to buy the food I need 0 No I eat alone most of the time 0 No I take three or more different prescribed or over-the-counter drugs a day 0 No Without wanting to, I have lost or gained 10 pounds in the last six months 0 No I am not always physically able to shop, cook and/or feed myself 0 No Nutrition Protocols Good Risk Protocol 0 No interventions needed Moderate Risk Protocol High Risk Proctocol Risk Level: Good Risk Score: 0 Electronic Signature(s) Signed: 10/14/2019 10:39:21 AM By: Army Melia Entered By: Army Melia on 10/12/2019 13:08:21

## 2019-10-14 NOTE — Progress Notes (Signed)
Crystal, Payne ([id number]) Visit Report for 10/12/2019 Allergy List Details Patient Name: [name], [name]. Date of Service: 10/12/2019 12:45 PM Medical Record Number: [id number] Patient Account Number: [id number] Date of Birth/Sex: December 04, 1929 (86 y.o. F) Treating RN: Army Melia Primary Care Melonee Gerstel: Ria Bush Other Clinician: Referring Jahlisa Rossitto: Boneta Lucks Treating Haelee Bolen/Extender: STONE III, HOYT Weeks in Treatment: 0 Allergies Active Allergies penicillin Allergy Notes Electronic Signature(s) Signed: 10/14/2019 10:39:21 AM By: Army Melia Entered By: Army Melia on 10/12/2019 13:04:26 [name] ([id number]) -------------------------------------------------------------------------------- Arrival Information Details Patient Name: [name] Date of Service: 10/12/2019 12:45 PM Medical Record Number: [id number] Patient Account Number: [id number] Date of Birth/Sex: 12/22/1929 (86 y.o. F) Treating RN: Cornell Barman Primary Care Capone Schwinn: Ria Bush Other Clinician: Referring Edvin Albus: Boneta Lucks Treating Amika Tassin/Extender: Melburn Hake, HOYT Weeks in Treatment: 0 Visit Information Patient Arrived: Walker Arrival Time: 12:58 Accompanied By: daughter Transfer Assistance: None Patient Identification Verified: Yes Secondary Verification Process Completed: Yes History Since Last Visit Added or deleted any medications: No Any new allergies or adverse reactions: No Had a fall or experienced change in activities of daily living that may affect risk of falls: No Signs or symptoms of abuse/neglect since last visito No Hospitalized since last visit: No Electronic Signature(s) Signed: 10/12/2019 1:42:04 PM By: Lorine Bears RCP, RRT, CHT Entered By: Lorine Bears on 10/12/2019 13:01:04 [name] ([id number]) -------------------------------------------------------------------------------- Clinic Level of Care Assessment  Details Patient Name: [name]. Date of Service: 10/12/2019 12:45 PM Medical Record Number: [id number] Patient Account Number: [id number] Date of Birth/Sex: Jul 03, 1929 (86 y.o. F) Treating RN: Army Melia Primary Care Johnsie Moscoso: Ria Bush Other Clinician: Referring Aiyla Baucom: Boneta Lucks Treating Velina Drollinger/Extender: Melburn Hake, HOYT Weeks in Treatment: 0 Clinic Level of Care Assessment Items TOOL 1 Quantity Score []  - Use when EandM and Procedure is performed on INITIAL visit 0 ASSESSMENTS - Nursing Assessment / Reassessment X - General Physical Exam (combine w/ comprehensive assessment (listed just below) when performed on new 1 20 pt. evals) X- 1 25 Comprehensive Assessment (HX, ROS, Risk Assessments, Wounds Hx, etc.) ASSESSMENTS - Wound and Skin Assessment / Reassessment []  - Dermatologic / Skin Assessment (not related to wound area) 0 ASSESSMENTS - Ostomy and/or Continence Assessment and Care []  - Incontinence Assessment and Management 0 []  - 0 Ostomy Care Assessment and Management (repouching, etc.) PROCESS - Coordination of Care X - Simple Patient / Family Education for ongoing care 1 15 []  - 0 Complex (extensive) Patient / Family Education for ongoing care X- 1 10 Staff obtains Programmer, systems, Records, Test Results / Process Orders []  - 0 Staff telephones HHA, Nursing Homes / Clarify orders / etc []  - 0 Routine Transfer to another Facility (non-emergent condition) []  - 0 Routine Hospital Admission (non-emergent condition) X- 1 15 New Admissions / Biomedical engineer / Ordering NPWT, Apligraf, etc. []  - 0 Emergency Hospital Admission (emergent condition) PROCESS - Special Needs []  - Pediatric / Minor Patient Management 0 []  - 0 Isolation Patient Management []  - 0 Hearing / Language / Visual special needs []  - 0 Assessment of Community assistance (transportation, D/C planning, etc.) []  - 0 Additional assistance / Altered mentation []  - 0 Support  Surface(s) Assessment (bed, cushion, seat, etc.) INTERVENTIONS - Miscellaneous []  - External ear exam 0 []  - 0 Patient Transfer (multiple staff / Civil Service fast streamer / Similar devices) []  - 0 Simple Staple / Suture removal (25 or less) []  - 0 Complex Staple / Suture removal (26 or more) []  - 0  Hypo/Hyperglycemic Management (do not check if billed separately) []  - 0 Ankle / Brachial Index (ABI) - do not check if billed separately Has the patient been seen at the hospital within the last three years: Yes Total Score: 85 Level Of Care: New/Established - Level 3 [name], [name] ([id number]) Electronic Signature(s) Signed: 10/14/2019 10:39:21 AM By: Army Melia Entered By: Army Melia on 10/12/2019 13:37:43 [name] ([id number]) -------------------------------------------------------------------------------- Encounter Discharge Information Details Patient Name: [name]. Date of Service: 10/12/2019 12:45 PM Medical Record Number: [id number] Patient Account Number: [id number] Date of Birth/Sex: May 29, 1929 (86 y.o. F) Treating RN: Army Melia Primary Care Meaghen Vecchiarelli: Ria Bush Other Clinician: Referring Hartford Maulden: Boneta Lucks Treating Miklos Bidinger/Extender: Melburn Hake, HOYT Weeks in Treatment: 0 Encounter Discharge Information Items Post Procedure Vitals Discharge Condition: Stable Temperature (F): 98.4 Ambulatory Status: Walker Pulse (bpm): 74 Discharge Destination: Home Respiratory Rate (breaths/min): 18 Transportation: Private Auto Blood Pressure (mmHg): 152/89 Accompanied By: self Schedule Follow-up Appointment: Yes Clinical Summary of Care: Electronic Signature(s) Signed: 10/14/2019 10:39:21 AM By: Army Melia Entered By: Army Melia on 10/12/2019 13:41:39 [name] ([id number]) -------------------------------------------------------------------------------- Lower Extremity Assessment Details Patient Name: [name]. Date of Service: 10/12/2019  12:45 PM Medical Record Number: [id number] Patient Account Number: [id number] Date of Birth/Sex: 1929/06/06 (86 y.o. F) Treating RN: Army Melia Primary Care Jariana Shumard: Ria Bush Other Clinician: Referring Kennette Cuthrell: Boneta Lucks Treating Delva Derden/Extender: Melburn Hake, HOYT Weeks in Treatment: 0 Edema Assessment Assessed: [Left: No] [Right: No] Edema: [Left: No] [Right: No] Calf Left: Right: Point of Measurement: 32 cm From Medial Instep 32 cm 32 cm Ankle Left: Right: Point of Measurement: 10 cm From Medial Instep 17 cm 19 cm Vascular Assessment Pulses: Dorsalis Pedis Palpable: [Left:Yes] [Right:Yes] Electronic Signature(s) Signed: 10/14/2019 10:39:21 AM By: Army Melia Entered By: Army Melia on 10/12/2019 13:11:01 [name] ([id number]) -------------------------------------------------------------------------------- Multi Wound Chart Details Patient Name: [name]. Date of Service: 10/12/2019 12:45 PM Medical Record Number: [id number] Patient Account Number: [id number] Date of Birth/Sex: 1930-02-17 (86 y.o. F) Treating RN: Army Melia Primary Care Margree Gimbel: Ria Bush Other Clinician: Referring Bartlomiej Jenkinson: Boneta Lucks Treating Jaxzen Vanhorn/Extender: Melburn Hake, HOYT Weeks in Treatment: 0 Vital Signs Height(in): Pulse(bpm): 74 Weight(lbs): Blood Pressure(mmHg): 152/89 Body Mass Index(BMI): Temperature(F): 98.2 Respiratory Rate(breaths/min): 18 Photos: [N/A:N/A] Wound Location: Right Metatarsal head fifth N/A N/A Wounding Event: Gradually Appeared N/A N/A Primary Etiology: Arterial Insufficiency Ulcer N/A N/A Comorbid History: Arrhythmia, Osteoarthritis N/A N/A Date Acquired: 04/09/2019 N/A N/A Weeks of Treatment: 0 N/A N/A Wound Status: Open N/A N/A Measurements L x W x D (cm) 1.2x1.2x0.1 N/A N/A Area (cm) : 1.131 N/A N/A Volume (cm) : 0.113 N/A N/A % Reduction in Area: 0.00% N/A N/A % Reduction in Volume: 0.00% N/A N/A Classification: Full  Thickness Without Exposed N/A N/A Support Structures Exudate Amount: Medium N/A N/A Exudate Type: Serosanguineous N/A N/A Exudate Color: red, brown N/A N/A Wound Margin: Distinct, outline attached N/A N/A Granulation Amount: Small (1-33%) N/A N/A Granulation Quality: Red N/A N/A Necrotic Amount: Large (67-100%) N/A N/A Exposed Structures: Fat Layer (Subcutaneous Tissue) N/A N/A Exposed: Yes Fascia: No Tendon: No Muscle: No Joint: No Bone: No Epithelialization: None N/A N/A Treatment Notes Electronic Signature(s) Signed: 10/14/2019 10:39:21 AM By: Army Melia Entered By: Army Melia on 10/12/2019 13:31:15 [name] ([id number]) -------------------------------------------------------------------------------- [place] Details Patient Name: [name] Date of Service: 10/12/2019 12:45 PM Medical Record Number: [id number] Patient Account Number: [id number] Date of Birth/Sex: Mar 11, 1930 (86 y.o. F) Treating RN: Army Melia  Primary Care Saquoia Sianez: Ria Bush Other Clinician: Referring Perel Hauschild: Boneta Lucks Treating Marigrace Mccole/Extender: Melburn Hake, HOYT Weeks in Treatment: 0 Active Inactive Abuse / Safety / Falls / Self Care Management Nursing Diagnoses: Potential for falls Goals: Patient/caregiver will verbalize understanding of skin care regimen Date Initiated: 10/12/2019 Target Resolution Date: 11/09/2019 Goal Status: Active Interventions: Assess fall risk on admission and as needed Notes: Necrotic Tissue Nursing Diagnoses: Impaired tissue integrity related to necrotic/devitalized tissue Goals: Necrotic/devitalized tissue will be minimized in the wound bed Date Initiated: 10/12/2019 Target Resolution Date: 11/09/2019 Goal Status: Active Interventions: Assess patient pain level pre-, during and post procedure and prior to discharge Treatment Activities: Enzymatic debridement : 10/12/2019 Notes: Orientation to the Wound Care Program Nursing  Diagnoses: Knowledge deficit related to the wound healing center program Goals: Patient/caregiver will verbalize understanding of the [place] Program Date Initiated: 10/12/2019 Target Resolution Date: 11/09/2019 Goal Status: Active Interventions: Provide education on orientation to the wound center Notes: Pain, Acute or Chronic Nursing Diagnoses: Potential alteration in comfort, pain Goals: Patient will verbalize adequate pain control and receive pain control interventions during procedures as needed [name], [name] ([id number]) Date Initiated: 10/12/2019 Target Resolution Date: 11/09/2019 Goal Status: Active Interventions: Complete pain assessment as per visit requirements Treatment Activities: Administer pain control measures as ordered : 10/12/2019 Notes: Peripheral Neuropathy Nursing Diagnoses: Knowledge deficit related to disease process and management of peripheral neurovascular dysfunction Potential alteration in peripheral tissue perfusion (select prior to confirmation of diagnosis) Goals: Patient/caregiver will verbalize understanding of disease process and disease management Date Initiated: 10/12/2019 Target Resolution Date: 11/09/2019 Goal Status: Active Interventions: Assess signs and symptoms of neuropathy upon admission and as needed Provide education on Management of Neuropathy and Related Ulcers Notes: Wound/Skin Impairment Nursing Diagnoses: Impaired tissue integrity Goals: Ulcer/skin breakdown will have a volume reduction of 30% by week 4 Date Initiated: 10/12/2019 Target Resolution Date: 11/09/2019 Goal Status: Active Interventions: Assess ulceration(s) every visit Treatment Activities: Skin care regimen initiated : 10/12/2019 Topical wound management initiated : 10/12/2019 Notes: Electronic Signature(s) Signed: 10/14/2019 10:39:21 AM By: Army Melia Entered By: Army Melia on 10/12/2019 13:30:52 [name]  ([id number]) -------------------------------------------------------------------------------- Pain Assessment Details Patient Name: [name]. Date of Service: 10/12/2019 12:45 PM Medical Record Number: [id number] Patient Account Number: [id number] Date of Birth/Sex: 04/08/1930 (86 y.o. F) Treating RN: Cornell Barman Primary Care Kynisha Memon: Ria Bush Other Clinician: Referring Danika Kluender: Boneta Lucks Treating Altus Zaino/Extender: Melburn Hake, HOYT Weeks in Treatment: 0 Active Problems Location of Pain Severity and Description of Pain Patient Has Paino Yes Site Locations Rate the pain. Current Pain Level: 2 Pain Management and Medication Current Pain Management: Electronic Signature(s) Signed: 10/12/2019 1:42:04 PM By: Lorine Bears RCP, RRT, CHT Signed: 10/12/2019 6:18:36 PM By: Gretta Cool, BSN, RN, CWS, Kim RN, BSN Entered By: Lorine Bears on 10/12/2019 13:01:15 [name] ([id number]) -------------------------------------------------------------------------------- Patient/Caregiver Education Details Patient Name: [name] Date of Service: 10/12/2019 12:45 PM Medical Record Number: [id number] Patient Account Number: [id number] Date of Birth/Gender: 1930-03-24 (86 y.o. F) Treating RN: Army Melia Primary Care Physician: Ria Bush Other Clinician: Referring Physician: Boneta Lucks Treating Physician/Extender: Sharalyn Ink in Treatment: 0 Education Assessment Education Provided To: Patient Education Topics Provided Peripheral Neuropathy: Handouts: General Foot Care Methods: Demonstration, Explain/Verbal Responses: State content correctly Welcome To [facility]: Handouts: Welcome To [facility] Methods: Demonstration, Explain/Verbal Responses: State content correctly Wound/Skin Impairment: Handouts: Caring for Your Ulcer Methods: Demonstration, Explain/Verbal Responses: State content  correctly  Electronic Signature(s) Signed: 10/14/2019 10:39:21 AM By: Army Melia Entered By: Army Melia on 10/12/2019 13:38:16 [name] ([id number]) -------------------------------------------------------------------------------- Wound Assessment Details Patient Name: [name] Date of Service: 10/12/2019 12:45 PM Medical Record Number: [id number] Patient Account Number: [id number] Date of Birth/Sex: 1929-12-14 (86 y.o. F) Treating RN: Army Melia Primary Care Merida Alcantar: Ria Bush Other Clinician: Referring Cydnie Deason: Boneta Lucks Treating Joyceline Maiorino/Extender: Melburn Hake, HOYT Weeks in Treatment: 0 Wound Status Wound Number: 3 Primary Etiology: Arterial Insufficiency Ulcer Wound Location: Right Metatarsal head fifth Wound Status: Open Wounding Event: Gradually Appeared Comorbid History: Arrhythmia, Osteoarthritis Date Acquired: 04/09/2019 Weeks Of Treatment: 0 Clustered Wound: No Photos Wound Measurements Length: (cm) 1.2 Width: (cm) 1.2 Depth: (cm) 0.1 Area: (cm) 1.131 Volume: (cm) 0.113 % Reduction in Area: 0% % Reduction in Volume: 0% Epithelialization: None Tunneling: No Undermining: No Wound Description Classification: Full Thickness Without Exposed Support Structu Wound Margin: Distinct, outline attached Exudate Amount: Medium Exudate Type: Serosanguineous Exudate Color: red, brown res Foul Odor After Cleansing: No Slough/Fibrino Yes Wound Bed Granulation Amount: Small (1-33%) Exposed Structure Granulation Quality: Red Fascia Exposed: No Necrotic Amount: Large (67-100%) Fat Layer (Subcutaneous Tissue) Exposed: Yes Necrotic Quality: Adherent Slough Tendon Exposed: No Muscle Exposed: No Joint Exposed: No Bone Exposed: No Treatment Notes Wound #3 (Right Metatarsal head fifth) 1. Cleansed with: Clean wound with Normal Saline 2. Anesthetic Topical Lidocaine 4% cream to wound bed prior to debridement [name], [name]. ([id number]) 4.  Dressing Applied: Santyl Ointment 5. Secondary Dressing Applied Saline moistened guaze Electronic Signature(s) Signed: 10/12/2019 1:17:54 PM By: Worthy Keeler PA-C Signed: 10/14/2019 10:39:21 AM By: Army Melia Entered By: Worthy Keeler on 10/12/2019 13:17:54 [name], [name] ([id number]) -------------------------------------------------------------------------------- Vitals Details Patient Name: [name] Date of Service: 10/12/2019 12:45 PM Medical Record Number: [id number] Patient Account Number: [id number] Date of Birth/Sex: 1929-08-09 (86 y.o. F) Treating RN: Cornell Barman Primary Care Dwanda Tufano: Ria Bush Other Clinician: Referring Alexee Delsanto: Boneta Lucks Treating Gatlyn Lipari/Extender: Melburn Hake, HOYT Weeks in Treatment: 0 Vital Signs Time Taken: 13:00 Temperature (F): 98.2 Pulse (bpm): 74 Respiratory Rate (breaths/min): 18 Blood Pressure (mmHg): 152/89 Reference Range: 80 - 120 mg / dl Electronic Signature(s) Signed: 10/12/2019 1:42:04 PM By: Lorine Bears RCP, RRT, CHT Entered By: Lorine Bears on 10/12/2019 13:02:00

## 2019-10-16 LAB — AEROBIC CULTURE W GRAM STAIN (SUPERFICIAL SPECIMEN)

## 2019-10-22 ENCOUNTER — Encounter: Payer: Medicare Other | Admitting: Internal Medicine

## 2019-10-22 ENCOUNTER — Other Ambulatory Visit: Payer: Self-pay

## 2019-10-22 DIAGNOSIS — M25532 Pain in left wrist: Secondary | ICD-10-CM | POA: Diagnosis not present

## 2019-10-22 DIAGNOSIS — L97512 Non-pressure chronic ulcer of other part of right foot with fat layer exposed: Secondary | ICD-10-CM | POA: Diagnosis not present

## 2019-10-22 DIAGNOSIS — E860 Dehydration: Secondary | ICD-10-CM | POA: Diagnosis not present

## 2019-10-22 DIAGNOSIS — E785 Hyperlipidemia, unspecified: Secondary | ICD-10-CM | POA: Diagnosis not present

## 2019-10-22 DIAGNOSIS — Z66 Do not resuscitate: Secondary | ICD-10-CM | POA: Diagnosis not present

## 2019-10-22 DIAGNOSIS — I4819 Other persistent atrial fibrillation: Secondary | ICD-10-CM | POA: Diagnosis not present

## 2019-10-22 DIAGNOSIS — N1831 Chronic kidney disease, stage 3a: Secondary | ICD-10-CM | POA: Diagnosis not present

## 2019-10-22 DIAGNOSIS — G319 Degenerative disease of nervous system, unspecified: Secondary | ICD-10-CM | POA: Diagnosis not present

## 2019-10-22 DIAGNOSIS — J9601 Acute respiratory failure with hypoxia: Secondary | ICD-10-CM | POA: Diagnosis not present

## 2019-10-22 DIAGNOSIS — I6782 Cerebral ischemia: Secondary | ICD-10-CM | POA: Diagnosis not present

## 2019-10-22 DIAGNOSIS — K219 Gastro-esophageal reflux disease without esophagitis: Secondary | ICD-10-CM | POA: Diagnosis not present

## 2019-10-22 DIAGNOSIS — H9190 Unspecified hearing loss, unspecified ear: Secondary | ICD-10-CM | POA: Diagnosis not present

## 2019-10-22 DIAGNOSIS — G8918 Other acute postprocedural pain: Secondary | ICD-10-CM | POA: Diagnosis not present

## 2019-10-22 DIAGNOSIS — M4855XA Collapsed vertebra, not elsewhere classified, thoracolumbar region, initial encounter for fracture: Secondary | ICD-10-CM | POA: Diagnosis not present

## 2019-10-22 DIAGNOSIS — I5033 Acute on chronic diastolic (congestive) heart failure: Secondary | ICD-10-CM | POA: Diagnosis not present

## 2019-10-22 DIAGNOSIS — J341 Cyst and mucocele of nose and nasal sinus: Secondary | ICD-10-CM | POA: Diagnosis not present

## 2019-10-22 DIAGNOSIS — R339 Retention of urine, unspecified: Secondary | ICD-10-CM | POA: Diagnosis not present

## 2019-10-22 DIAGNOSIS — G2581 Restless legs syndrome: Secondary | ICD-10-CM | POA: Diagnosis not present

## 2019-10-22 DIAGNOSIS — S52309A Unspecified fracture of shaft of unspecified radius, initial encounter for closed fracture: Secondary | ICD-10-CM | POA: Diagnosis not present

## 2019-10-22 DIAGNOSIS — I7090 Unspecified atherosclerosis: Secondary | ICD-10-CM | POA: Diagnosis not present

## 2019-10-22 DIAGNOSIS — S6291XA Unspecified fracture of right wrist and hand, initial encounter for closed fracture: Secondary | ICD-10-CM | POA: Diagnosis not present

## 2019-10-22 DIAGNOSIS — S52612A Displaced fracture of left ulna styloid process, initial encounter for closed fracture: Secondary | ICD-10-CM | POA: Diagnosis not present

## 2019-10-22 DIAGNOSIS — I2510 Atherosclerotic heart disease of native coronary artery without angina pectoris: Secondary | ICD-10-CM | POA: Diagnosis not present

## 2019-10-22 DIAGNOSIS — S52592A Other fractures of lower end of left radius, initial encounter for closed fracture: Secondary | ICD-10-CM | POA: Diagnosis not present

## 2019-10-22 DIAGNOSIS — S0990XA Unspecified injury of head, initial encounter: Secondary | ICD-10-CM | POA: Diagnosis not present

## 2019-10-22 DIAGNOSIS — G629 Polyneuropathy, unspecified: Secondary | ICD-10-CM | POA: Diagnosis not present

## 2019-10-22 DIAGNOSIS — Z515 Encounter for palliative care: Secondary | ICD-10-CM | POA: Diagnosis not present

## 2019-10-22 DIAGNOSIS — M4856XA Collapsed vertebra, not elsewhere classified, lumbar region, initial encounter for fracture: Secondary | ICD-10-CM | POA: Diagnosis not present

## 2019-10-22 DIAGNOSIS — R296 Repeated falls: Secondary | ICD-10-CM | POA: Diagnosis not present

## 2019-10-22 DIAGNOSIS — S52615A Nondisplaced fracture of left ulna styloid process, initial encounter for closed fracture: Secondary | ICD-10-CM | POA: Diagnosis not present

## 2019-10-22 DIAGNOSIS — L97519 Non-pressure chronic ulcer of other part of right foot with unspecified severity: Secondary | ICD-10-CM | POA: Diagnosis not present

## 2019-10-22 DIAGNOSIS — I739 Peripheral vascular disease, unspecified: Secondary | ICD-10-CM | POA: Diagnosis not present

## 2019-10-22 DIAGNOSIS — E538 Deficiency of other specified B group vitamins: Secondary | ICD-10-CM | POA: Diagnosis not present

## 2019-10-22 DIAGNOSIS — S3992XA Unspecified injury of lower back, initial encounter: Secondary | ICD-10-CM | POA: Diagnosis not present

## 2019-10-22 DIAGNOSIS — Z20822 Contact with and (suspected) exposure to covid-19: Secondary | ICD-10-CM | POA: Diagnosis not present

## 2019-10-22 DIAGNOSIS — N179 Acute kidney failure, unspecified: Secondary | ICD-10-CM | POA: Diagnosis not present

## 2019-10-22 DIAGNOSIS — S52572A Other intraarticular fracture of lower end of left radius, initial encounter for closed fracture: Secondary | ICD-10-CM | POA: Diagnosis not present

## 2019-10-22 DIAGNOSIS — I130 Hypertensive heart and chronic kidney disease with heart failure and stage 1 through stage 4 chronic kidney disease, or unspecified chronic kidney disease: Secondary | ICD-10-CM | POA: Diagnosis not present

## 2019-10-23 ENCOUNTER — Encounter: Payer: Self-pay | Admitting: Emergency Medicine

## 2019-10-23 ENCOUNTER — Emergency Department: Payer: Medicare Other

## 2019-10-23 ENCOUNTER — Inpatient Hospital Stay
Admission: EM | Admit: 2019-10-23 | Discharge: 2019-10-27 | DRG: 682 | Disposition: A | Discharge status: Skilled Nursing Facility | Payer: Medicare Other | Attending: Internal Medicine | Admitting: Internal Medicine

## 2019-10-23 ENCOUNTER — Inpatient Hospital Stay: Payer: Medicare Other

## 2019-10-23 ENCOUNTER — Other Ambulatory Visit: Payer: Self-pay

## 2019-10-23 DIAGNOSIS — Z515 Encounter for palliative care: Secondary | ICD-10-CM | POA: Diagnosis not present

## 2019-10-23 DIAGNOSIS — M5136 Other intervertebral disc degeneration, lumbar region: Secondary | ICD-10-CM | POA: Diagnosis not present

## 2019-10-23 DIAGNOSIS — I10 Essential (primary) hypertension: Secondary | ICD-10-CM | POA: Diagnosis present

## 2019-10-23 DIAGNOSIS — Z85828 Personal history of other malignant neoplasm of skin: Secondary | ICD-10-CM

## 2019-10-23 DIAGNOSIS — J341 Cyst and mucocele of nose and nasal sinus: Secondary | ICD-10-CM | POA: Diagnosis not present

## 2019-10-23 DIAGNOSIS — R2681 Unsteadiness on feet: Secondary | ICD-10-CM | POA: Diagnosis present

## 2019-10-23 DIAGNOSIS — E538 Deficiency of other specified B group vitamins: Secondary | ICD-10-CM | POA: Diagnosis not present

## 2019-10-23 DIAGNOSIS — S6291XA Unspecified fracture of right wrist and hand, initial encounter for closed fracture: Secondary | ICD-10-CM | POA: Diagnosis not present

## 2019-10-23 DIAGNOSIS — S52592A Other fractures of lower end of left radius, initial encounter for closed fracture: Secondary | ICD-10-CM | POA: Diagnosis not present

## 2019-10-23 DIAGNOSIS — M069 Rheumatoid arthritis, unspecified: Secondary | ICD-10-CM | POA: Diagnosis present

## 2019-10-23 DIAGNOSIS — Z9181 History of falling: Secondary | ICD-10-CM

## 2019-10-23 DIAGNOSIS — Z7401 Bed confinement status: Secondary | ICD-10-CM | POA: Diagnosis not present

## 2019-10-23 DIAGNOSIS — I739 Peripheral vascular disease, unspecified: Secondary | ICD-10-CM | POA: Diagnosis present

## 2019-10-23 DIAGNOSIS — Z66 Do not resuscitate: Secondary | ICD-10-CM | POA: Diagnosis present

## 2019-10-23 DIAGNOSIS — Z87891 Personal history of nicotine dependence: Secondary | ICD-10-CM

## 2019-10-23 DIAGNOSIS — R0600 Dyspnea, unspecified: Secondary | ICD-10-CM | POA: Diagnosis not present

## 2019-10-23 DIAGNOSIS — Y92012 Bathroom of single-family (private) house as the place of occurrence of the external cause: Secondary | ICD-10-CM | POA: Diagnosis not present

## 2019-10-23 DIAGNOSIS — G2581 Restless legs syndrome: Secondary | ICD-10-CM | POA: Diagnosis not present

## 2019-10-23 DIAGNOSIS — J9601 Acute respiratory failure with hypoxia: Secondary | ICD-10-CM

## 2019-10-23 DIAGNOSIS — I2510 Atherosclerotic heart disease of native coronary artery without angina pectoris: Secondary | ICD-10-CM | POA: Diagnosis not present

## 2019-10-23 DIAGNOSIS — E860 Dehydration: Secondary | ICD-10-CM | POA: Diagnosis present

## 2019-10-23 DIAGNOSIS — R296 Repeated falls: Secondary | ICD-10-CM | POA: Diagnosis present

## 2019-10-23 DIAGNOSIS — K219 Gastro-esophageal reflux disease without esophagitis: Secondary | ICD-10-CM | POA: Diagnosis not present

## 2019-10-23 DIAGNOSIS — H9190 Unspecified hearing loss, unspecified ear: Secondary | ICD-10-CM | POA: Diagnosis present

## 2019-10-23 DIAGNOSIS — W19XXXA Unspecified fall, initial encounter: Secondary | ICD-10-CM | POA: Diagnosis not present

## 2019-10-23 DIAGNOSIS — Z880 Allergy status to penicillin: Secondary | ICD-10-CM

## 2019-10-23 DIAGNOSIS — I272 Pulmonary hypertension, unspecified: Secondary | ICD-10-CM | POA: Diagnosis not present

## 2019-10-23 DIAGNOSIS — S22050D Wedge compression fracture of T5-T6 vertebra, subsequent encounter for fracture with routine healing: Secondary | ICD-10-CM | POA: Diagnosis not present

## 2019-10-23 DIAGNOSIS — Z8249 Family history of ischemic heart disease and other diseases of the circulatory system: Secondary | ICD-10-CM

## 2019-10-23 DIAGNOSIS — Z800 Family history of malignant neoplasm of digestive organs: Secondary | ICD-10-CM

## 2019-10-23 DIAGNOSIS — Z981 Arthrodesis status: Secondary | ICD-10-CM

## 2019-10-23 DIAGNOSIS — N179 Acute kidney failure, unspecified: Principal | ICD-10-CM

## 2019-10-23 DIAGNOSIS — I5032 Chronic diastolic (congestive) heart failure: Secondary | ICD-10-CM

## 2019-10-23 DIAGNOSIS — M4855XA Collapsed vertebra, not elsewhere classified, thoracolumbar region, initial encounter for fracture: Secondary | ICD-10-CM | POA: Diagnosis not present

## 2019-10-23 DIAGNOSIS — I5033 Acute on chronic diastolic (congestive) heart failure: Secondary | ICD-10-CM | POA: Diagnosis present

## 2019-10-23 DIAGNOSIS — I4819 Other persistent atrial fibrillation: Secondary | ICD-10-CM | POA: Diagnosis present

## 2019-10-23 DIAGNOSIS — Z7982 Long term (current) use of aspirin: Secondary | ICD-10-CM

## 2019-10-23 DIAGNOSIS — Y9301 Activity, walking, marching and hiking: Secondary | ICD-10-CM | POA: Diagnosis present

## 2019-10-23 DIAGNOSIS — L97509 Non-pressure chronic ulcer of other part of unspecified foot with unspecified severity: Secondary | ICD-10-CM

## 2019-10-23 DIAGNOSIS — G9009 Other idiopathic peripheral autonomic neuropathy: Secondary | ICD-10-CM | POA: Diagnosis not present

## 2019-10-23 DIAGNOSIS — D649 Anemia, unspecified: Secondary | ICD-10-CM | POA: Diagnosis present

## 2019-10-23 DIAGNOSIS — L97519 Non-pressure chronic ulcer of other part of right foot with unspecified severity: Secondary | ICD-10-CM | POA: Diagnosis not present

## 2019-10-23 DIAGNOSIS — Z79899 Other long term (current) drug therapy: Secondary | ICD-10-CM

## 2019-10-23 DIAGNOSIS — M25532 Pain in left wrist: Secondary | ICD-10-CM | POA: Diagnosis not present

## 2019-10-23 DIAGNOSIS — S62101A Fracture of unspecified carpal bone, right wrist, initial encounter for closed fracture: Secondary | ICD-10-CM | POA: Diagnosis not present

## 2019-10-23 DIAGNOSIS — Z20822 Contact with and (suspected) exposure to covid-19: Secondary | ICD-10-CM | POA: Diagnosis present

## 2019-10-23 DIAGNOSIS — Z7189 Other specified counseling: Secondary | ICD-10-CM

## 2019-10-23 DIAGNOSIS — S62102D Fracture of unspecified carpal bone, left wrist, subsequent encounter for fracture with routine healing: Secondary | ICD-10-CM | POA: Diagnosis not present

## 2019-10-23 DIAGNOSIS — S52572A Other intraarticular fracture of lower end of left radius, initial encounter for closed fracture: Secondary | ICD-10-CM | POA: Diagnosis present

## 2019-10-23 DIAGNOSIS — M6281 Muscle weakness (generalized): Secondary | ICD-10-CM | POA: Diagnosis not present

## 2019-10-23 DIAGNOSIS — S52309A Unspecified fracture of shaft of unspecified radius, initial encounter for closed fracture: Secondary | ICD-10-CM | POA: Diagnosis present

## 2019-10-23 DIAGNOSIS — I4891 Unspecified atrial fibrillation: Secondary | ICD-10-CM | POA: Diagnosis not present

## 2019-10-23 DIAGNOSIS — E7800 Pure hypercholesterolemia, unspecified: Secondary | ICD-10-CM | POA: Diagnosis present

## 2019-10-23 DIAGNOSIS — R339 Retention of urine, unspecified: Secondary | ICD-10-CM | POA: Diagnosis present

## 2019-10-23 DIAGNOSIS — W010XXA Fall on same level from slipping, tripping and stumbling without subsequent striking against object, initial encounter: Secondary | ICD-10-CM | POA: Diagnosis present

## 2019-10-23 DIAGNOSIS — Z743 Need for continuous supervision: Secondary | ICD-10-CM | POA: Diagnosis not present

## 2019-10-23 DIAGNOSIS — S52612A Displaced fracture of left ulna styloid process, initial encounter for closed fracture: Secondary | ICD-10-CM | POA: Diagnosis not present

## 2019-10-23 DIAGNOSIS — S0990XA Unspecified injury of head, initial encounter: Secondary | ICD-10-CM | POA: Diagnosis not present

## 2019-10-23 DIAGNOSIS — S52502A Unspecified fracture of the lower end of left radius, initial encounter for closed fracture: Secondary | ICD-10-CM | POA: Diagnosis not present

## 2019-10-23 DIAGNOSIS — Z823 Family history of stroke: Secondary | ICD-10-CM

## 2019-10-23 DIAGNOSIS — S3992XA Unspecified injury of lower back, initial encounter: Secondary | ICD-10-CM | POA: Diagnosis not present

## 2019-10-23 DIAGNOSIS — R1310 Dysphagia, unspecified: Secondary | ICD-10-CM | POA: Diagnosis not present

## 2019-10-23 DIAGNOSIS — G8918 Other acute postprocedural pain: Secondary | ICD-10-CM | POA: Diagnosis not present

## 2019-10-23 DIAGNOSIS — S52615A Nondisplaced fracture of left ulna styloid process, initial encounter for closed fracture: Secondary | ICD-10-CM | POA: Diagnosis present

## 2019-10-23 DIAGNOSIS — Z9189 Other specified personal risk factors, not elsewhere classified: Secondary | ICD-10-CM | POA: Diagnosis not present

## 2019-10-23 DIAGNOSIS — S62102A Fracture of unspecified carpal bone, left wrist, initial encounter for closed fracture: Secondary | ICD-10-CM | POA: Diagnosis not present

## 2019-10-23 DIAGNOSIS — I130 Hypertensive heart and chronic kidney disease with heart failure and stage 1 through stage 4 chronic kidney disease, or unspecified chronic kidney disease: Secondary | ICD-10-CM | POA: Diagnosis present

## 2019-10-23 DIAGNOSIS — S6982XA Other specified injuries of left wrist, hand and finger(s), initial encounter: Secondary | ICD-10-CM | POA: Diagnosis not present

## 2019-10-23 DIAGNOSIS — I7090 Unspecified atherosclerosis: Secondary | ICD-10-CM | POA: Diagnosis not present

## 2019-10-23 DIAGNOSIS — G629 Polyneuropathy, unspecified: Secondary | ICD-10-CM | POA: Diagnosis present

## 2019-10-23 DIAGNOSIS — M1990 Unspecified osteoarthritis, unspecified site: Secondary | ICD-10-CM | POA: Diagnosis not present

## 2019-10-23 DIAGNOSIS — S32010D Wedge compression fracture of first lumbar vertebra, subsequent encounter for fracture with routine healing: Secondary | ICD-10-CM | POA: Diagnosis not present

## 2019-10-23 DIAGNOSIS — M2550 Pain in unspecified joint: Secondary | ICD-10-CM | POA: Diagnosis not present

## 2019-10-23 DIAGNOSIS — G319 Degenerative disease of nervous system, unspecified: Secondary | ICD-10-CM | POA: Diagnosis not present

## 2019-10-23 DIAGNOSIS — M810 Age-related osteoporosis without current pathological fracture: Secondary | ICD-10-CM | POA: Diagnosis not present

## 2019-10-23 DIAGNOSIS — S62101S Fracture of unspecified carpal bone, right wrist, sequela: Secondary | ICD-10-CM | POA: Diagnosis not present

## 2019-10-23 DIAGNOSIS — Z90710 Acquired absence of both cervix and uterus: Secondary | ICD-10-CM

## 2019-10-23 DIAGNOSIS — N1831 Chronic kidney disease, stage 3a: Secondary | ICD-10-CM | POA: Diagnosis present

## 2019-10-23 DIAGNOSIS — Z825 Family history of asthma and other chronic lower respiratory diseases: Secondary | ICD-10-CM

## 2019-10-23 DIAGNOSIS — M4856XA Collapsed vertebra, not elsewhere classified, lumbar region, initial encounter for fracture: Secondary | ICD-10-CM | POA: Diagnosis not present

## 2019-10-23 DIAGNOSIS — Z7901 Long term (current) use of anticoagulants: Secondary | ICD-10-CM

## 2019-10-23 DIAGNOSIS — Z8619 Personal history of other infectious and parasitic diseases: Secondary | ICD-10-CM

## 2019-10-23 DIAGNOSIS — R278 Other lack of coordination: Secondary | ICD-10-CM | POA: Diagnosis not present

## 2019-10-23 DIAGNOSIS — S41111A Laceration without foreign body of right upper arm, initial encounter: Secondary | ICD-10-CM | POA: Diagnosis present

## 2019-10-23 DIAGNOSIS — E7849 Other hyperlipidemia: Secondary | ICD-10-CM | POA: Diagnosis not present

## 2019-10-23 DIAGNOSIS — E785 Hyperlipidemia, unspecified: Secondary | ICD-10-CM | POA: Diagnosis present

## 2019-10-23 DIAGNOSIS — B0230 Zoster ocular disease, unspecified: Secondary | ICD-10-CM | POA: Diagnosis not present

## 2019-10-23 DIAGNOSIS — M8008XS Age-related osteoporosis with current pathological fracture, vertebra(e), sequela: Secondary | ICD-10-CM | POA: Diagnosis present

## 2019-10-23 DIAGNOSIS — Z87310 Personal history of (healed) osteoporosis fracture: Secondary | ICD-10-CM

## 2019-10-23 DIAGNOSIS — I6782 Cerebral ischemia: Secondary | ICD-10-CM | POA: Diagnosis not present

## 2019-10-23 DIAGNOSIS — R262 Difficulty in walking, not elsewhere classified: Secondary | ICD-10-CM | POA: Diagnosis not present

## 2019-10-23 DIAGNOSIS — R531 Weakness: Secondary | ICD-10-CM | POA: Diagnosis present

## 2019-10-23 DIAGNOSIS — Z833 Family history of diabetes mellitus: Secondary | ICD-10-CM

## 2019-10-23 DIAGNOSIS — N178 Other acute kidney failure: Secondary | ICD-10-CM | POA: Diagnosis not present

## 2019-10-23 DIAGNOSIS — R6889 Other general symptoms and signs: Secondary | ICD-10-CM | POA: Diagnosis not present

## 2019-10-23 DIAGNOSIS — E559 Vitamin D deficiency, unspecified: Secondary | ICD-10-CM | POA: Diagnosis not present

## 2019-10-23 DIAGNOSIS — I059 Rheumatic mitral valve disease, unspecified: Secondary | ICD-10-CM | POA: Diagnosis present

## 2019-10-23 DIAGNOSIS — N183 Chronic kidney disease, stage 3 unspecified: Secondary | ICD-10-CM | POA: Diagnosis present

## 2019-10-23 DIAGNOSIS — S52602A Unspecified fracture of lower end of left ulna, initial encounter for closed fracture: Secondary | ICD-10-CM | POA: Diagnosis not present

## 2019-10-23 LAB — CBC WITH DIFFERENTIAL/PLATELET
Abs Immature Granulocytes: 0.02 10*3/uL (ref 0.00–0.07)
Basophils Absolute: 0.1 10*3/uL (ref 0.0–0.1)
Basophils Relative: 1 %
Eosinophils Absolute: 0.2 10*3/uL (ref 0.0–0.5)
Eosinophils Relative: 3 %
HCT: 34.4 % — ABNORMAL LOW (ref 36.0–46.0)
Hemoglobin: 11.4 g/dL — ABNORMAL LOW (ref 12.0–15.0)
Immature Granulocytes: 0 %
Lymphocytes Relative: 11 %
Lymphs Abs: 0.7 10*3/uL (ref 0.7–4.0)
MCH: 32.8 pg (ref 26.0–34.0)
MCHC: 33.1 g/dL (ref 30.0–36.0)
MCV: 98.9 fL (ref 80.0–100.0)
Monocytes Absolute: 0.6 10*3/uL (ref 0.1–1.0)
Monocytes Relative: 9 %
Neutro Abs: 5 10*3/uL (ref 1.7–7.7)
Neutrophils Relative %: 76 %
Platelets: 235 10*3/uL (ref 150–400)
RBC: 3.48 MIL/uL — ABNORMAL LOW (ref 3.87–5.11)
RDW: 13.7 % (ref 11.5–15.5)
WBC: 6.5 10*3/uL (ref 4.0–10.5)
nRBC: 0 % (ref 0.0–0.2)

## 2019-10-23 LAB — URINALYSIS, COMPLETE (UACMP) WITH MICROSCOPIC
Bacteria, UA: NONE SEEN
Bilirubin Urine: NEGATIVE
Glucose, UA: NEGATIVE mg/dL
Hgb urine dipstick: NEGATIVE
Ketones, ur: NEGATIVE mg/dL
Leukocytes,Ua: NEGATIVE
Nitrite: NEGATIVE
Protein, ur: NEGATIVE mg/dL
Specific Gravity, Urine: 1.011 (ref 1.005–1.030)
Squamous Epithelial / HPF: NONE SEEN (ref 0–5)
pH: 6 (ref 5.0–8.0)

## 2019-10-23 LAB — COMPREHENSIVE METABOLIC PANEL
ALT: 18 U/L (ref 0–44)
AST: 20 U/L (ref 15–41)
Albumin: 4 g/dL (ref 3.5–5.0)
Alkaline Phosphatase: 45 U/L (ref 38–126)
Anion gap: 8 (ref 5–15)
BUN: 30 mg/dL — ABNORMAL HIGH (ref 8–23)
CO2: 28 mmol/L (ref 22–32)
Calcium: 8.7 mg/dL — ABNORMAL LOW (ref 8.9–10.3)
Chloride: 98 mmol/L (ref 98–111)
Creatinine, Ser: 2.12 mg/dL — ABNORMAL HIGH (ref 0.44–1.00)
GFR calc Af Amer: 23 mL/min — ABNORMAL LOW (ref >60)
GFR calc non Af Amer: 20 mL/min — ABNORMAL LOW (ref >60)
Glucose, Bld: 102 mg/dL — ABNORMAL HIGH (ref 70–99)
Potassium: 4.6 mmol/L (ref 3.5–5.1)
Sodium: 134 mmol/L — ABNORMAL LOW (ref 135–145)
Total Bilirubin: 0.5 mg/dL (ref 0.3–1.2)
Total Protein: 6.8 g/dL (ref 6.5–8.1)

## 2019-10-23 LAB — CREATININE, URINE, RANDOM: Creatinine, Urine: 58 mg/dL

## 2019-10-23 LAB — SARS CORONAVIRUS 2 BY RT PCR (HOSPITAL ORDER, PERFORMED IN ~~LOC~~ HOSPITAL LAB): SARS Coronavirus 2: NEGATIVE

## 2019-10-23 LAB — SODIUM, URINE, RANDOM: Sodium, Ur: 77 mmol/L

## 2019-10-23 MED ORDER — POLYETHYLENE GLYCOL 3350 17 G PO PACK: 17.0000 g | PACK | Freq: Every day | ORAL | Status: DC | PRN

## 2019-10-23 MED ORDER — MORPHINE SULFATE (PF) 4 MG/ML IV SOLN
4.0000 mg | Freq: Once | INTRAVENOUS | Status: AC
  Administered 2019-10-23: 4 mg via INTRAMUSCULAR
  Filled 2019-10-23: qty 1

## 2019-10-23 MED ORDER — GABAPENTIN 300 MG PO CAPS
300.0000 mg | ORAL_CAPSULE | Freq: Every day | ORAL | Status: DC
  Administered 2019-10-23 – 2019-10-26 (×4): 300 mg via ORAL
  Filled 2019-10-23 (×4): qty 1

## 2019-10-23 MED ORDER — ONDANSETRON 4 MG PO TBDP
4.0000 mg | ORAL_TABLET | Freq: Once | ORAL | Status: AC
  Administered 2019-10-23: 4 mg via ORAL
  Filled 2019-10-23: qty 1

## 2019-10-23 MED ORDER — TIZANIDINE HCL 2 MG PO TABS
2.0000 mg | ORAL_TABLET | Freq: Every evening | ORAL | Status: DC
  Administered 2019-10-23 – 2019-10-26 (×4): 2 mg via ORAL
  Filled 2019-10-23 (×6): qty 1

## 2019-10-23 MED ORDER — APIXABAN 2.5 MG PO TABS
2.5000 mg | ORAL_TABLET | Freq: Two times a day (BID) | ORAL | Status: DC
  Administered 2019-10-23 – 2019-10-27 (×8): 2.5 mg via ORAL
  Filled 2019-10-23 (×8): qty 1

## 2019-10-23 MED ORDER — AMIODARONE HCL 200 MG PO TABS
100.0000 mg | ORAL_TABLET | Freq: Every day | ORAL | Status: DC
  Administered 2019-10-23 – 2019-10-27 (×5): 100 mg via ORAL
  Filled 2019-10-23 (×5): qty 1

## 2019-10-23 MED ORDER — OXYCODONE HCL 5 MG PO TABS
5.0000 mg | ORAL_TABLET | ORAL | Status: DC | PRN
  Administered 2019-10-23 – 2019-10-26 (×9): 5 mg via ORAL
  Filled 2019-10-23 (×10): qty 1

## 2019-10-23 MED ORDER — ONDANSETRON HCL 4 MG PO TABS: 4.0000 mg | ORAL_TABLET | Freq: Four times a day (QID) | ORAL | Status: DC | PRN

## 2019-10-23 MED ORDER — TRAMADOL HCL 50 MG PO TABS
50.0000 mg | ORAL_TABLET | Freq: Two times a day (BID) | ORAL | Status: DC | PRN
  Administered 2019-10-24 – 2019-10-25 (×2): 50 mg via ORAL
  Filled 2019-10-23 (×2): qty 1

## 2019-10-23 MED ORDER — DULOXETINE HCL 60 MG PO CPEP
60.0000 mg | ORAL_CAPSULE | Freq: Every day | ORAL | Status: DC
  Administered 2019-10-23 – 2019-10-27 (×5): 60 mg via ORAL
  Filled 2019-10-23 (×6): qty 1

## 2019-10-23 MED ORDER — HYDROCODONE-ACETAMINOPHEN 5-325 MG PO TABS: 1.0000 | ORAL_TABLET | Freq: Four times a day (QID) | ORAL | 0 refills | Status: DC | PRN

## 2019-10-23 MED ORDER — MORPHINE SULFATE (PF) 2 MG/ML IV SOLN
1.0000 mg | INTRAVENOUS | Status: DC | PRN
  Administered 2019-10-23 – 2019-10-24 (×2): 2 mg via INTRAVENOUS
  Filled 2019-10-23 (×2): qty 1

## 2019-10-23 MED ORDER — SODIUM CHLORIDE 0.9 % IV SOLN: INTRAVENOUS | Status: DC

## 2019-10-23 MED ORDER — HYDROCODONE-ACETAMINOPHEN 5-325 MG PO TABS
1.0000 | ORAL_TABLET | Freq: Once | ORAL | Status: AC
  Administered 2019-10-23: 1 via ORAL
  Filled 2019-10-23: qty 1

## 2019-10-23 MED ORDER — BISACODYL 5 MG PO TBEC
5.0000 mg | DELAYED_RELEASE_TABLET | Freq: Every day | ORAL | Status: DC | PRN
  Administered 2019-10-26: 5 mg via ORAL
  Filled 2019-10-23: qty 1

## 2019-10-23 MED ORDER — ASPIRIN EC 81 MG PO TBEC
81.0000 mg | DELAYED_RELEASE_TABLET | Freq: Every day | ORAL | Status: DC
  Administered 2019-10-23 – 2019-10-27 (×5): 81 mg via ORAL
  Filled 2019-10-23 (×5): qty 1

## 2019-10-23 MED ORDER — ATORVASTATIN CALCIUM 20 MG PO TABS
20.0000 mg | ORAL_TABLET | Freq: Every day | ORAL | Status: DC
  Administered 2019-10-23 – 2019-10-27 (×5): 20 mg via ORAL
  Filled 2019-10-23 (×5): qty 1

## 2019-10-23 MED ORDER — ACETAMINOPHEN 500 MG PO TABS
1000.0000 mg | ORAL_TABLET | Freq: Three times a day (TID) | ORAL | Status: DC | PRN
  Administered 2019-10-24: 1000 mg via ORAL
  Filled 2019-10-23: qty 2

## 2019-10-23 MED ORDER — ROPINIROLE HCL 1 MG PO TABS
2.0000 mg | ORAL_TABLET | Freq: Every day | ORAL | Status: DC
  Administered 2019-10-23 – 2019-10-26 (×4): 2 mg via ORAL
  Filled 2019-10-23 (×4): qty 2

## 2019-10-23 MED ORDER — FENTANYL CITRATE (PF) 100 MCG/2ML IJ SOLN
50.0000 ug | Freq: Once | INTRAMUSCULAR | Status: AC
  Administered 2019-10-23: 50 ug via INTRAVENOUS
  Filled 2019-10-23: qty 2

## 2019-10-23 MED ORDER — ONDANSETRON HCL 4 MG/2ML IJ SOLN: 4.0000 mg | Freq: Four times a day (QID) | INTRAMUSCULAR | Status: DC | PRN

## 2019-10-23 MED ORDER — PANTOPRAZOLE SODIUM 40 MG PO TBEC
40.0000 mg | DELAYED_RELEASE_TABLET | Freq: Every day | ORAL | Status: DC
  Administered 2019-10-23 – 2019-10-27 (×5): 40 mg via ORAL
  Filled 2019-10-23 (×5): qty 1

## 2019-10-23 NOTE — ED Notes (Signed)
Family at bedside  Provider in with pt  Family is concerned for her staying alone

## 2019-10-23 NOTE — ED Provider Notes (Signed)
[facility] Emergency Department Provider Note  ____________________________________________   First MD Initiated Contact with Patient 10/23/19 1257     (approximate)  I have reviewed the triage vital signs and the nursing notes.   HISTORY  Chief Complaint Wrist Injury    HPI [name] is a 86 y.o. female presents emergency department after a fall at home.  Patient is supposed to use a walker but states she left it him started to go to the door when she fell.  States she landed on her left forearm.  She denies hitting her head but patient is also on Eliquis.  She denies any hip pain or leg pain.  She does have a skin tear on her right arm.  No other complaints at this time.  Family does state the patient's had multiple falls recently.    Past Medical History:  Diagnosis Date  . Abdominal aortic atherosclerosis ([name]) 09/2015   by xray  . Anxiety   . BCC (basal cell carcinoma of skin) 05/2011   on nose  . Breast lesion    a. diagnosed with complex sclerosing lesion with calcifications of the right breast in 08/2016  . Chest pain    a. nuclear stress test 03/2012: normal; b. 09/2018 MV: EF >65%, no isch/scar.   . DDD (degenerative disc disease), lumbosacral 12/06/1992  . Gallstones 09/2015   incidentally by xray  . GERD (gastroesophageal reflux disease) 05/1999  . HERPES ZOSTER 04/13/2007   Qualifier: Diagnosis of  By: [name], [name]   . Hiatal hernia   . History of shingles    ophthalmic, takes acyclovir daily  . Hyperlipidemia 12/2003  . Hypertension 1990  . Mitral regurgitation    a. echo 03/2012: EF 60-65%, no RWMA, mild to mod AI/MR, mod TR, PASP 37 mmHg  . Osteoarthritis 08/21/1989  . Osteoporosis with fracture 11/1997   with compression fracture T12  . Persistent atrial fibrillation ([name]) 03/16/2012   a. CHADS2VASc => 5 (HTN, age x 2, vascular disease, sex category)-->Eliquis 5 BID;  b. Recurrent AF in 06/2016;  c. 01/2017  s/p DCCV;  d. 02/2017 recurrent AFib->Amio added->DCCV; e. 03/2017 recurrent AFib, s/p reload of amio and DCCV 04/02/2017  . Rheumatoid arthritis ([place])   . RLS (restless legs syndrome)     Patient Active Problem List   Diagnosis Date Noted  . Ulcer of right foot ([place]) 08/14/2019  . CAD (coronary artery disease), native coronary artery 07/13/2019  . Dyspnea on exertion   . Pulmonary hypertension, unspecified ([place])   . Urge incontinence 05/06/2019  . Skin lesion on examination 05/06/2019  . PAD (peripheral artery disease) ([place]) 03/12/2019  . Bilateral foot pain 01/28/2019  . Hypertrophic toenail 01/27/2019  . Cyanosis 01/12/2019  . Hemorrhoid 11/25/2018  . Vitamin B12 deficiency 08/04/2018  . Bilateral leg and foot pain 07/31/2018  . Vitamin D deficiency 02/25/2018  . Peripheral neuropathy 12/04/2017  . General unsteadiness 10/01/2017  . Constipation 10/01/2017  . Esophageal dysphagia 04/08/2017  . Chronic radicular pain of lower back 04/08/2017  . Fall 03/04/2017  . Encounter for chronic pain management 11/11/2016  . Breast mass, right 09/29/2016  . Abdominal aortic atherosclerosis ([name]) 09/07/2015  . Insomnia 06/08/2015  . Health maintenance examination 12/08/2014  . Advanced care planning/counseling discussion 12/08/2014  . Chronic leg pain 10/13/2014  . Persistent atrial fibrillation ([name]) 04/24/2013  . Medicare annual wellness visit, subsequent 04/14/2012  . Chest tightness 03/16/2012  . RLS (restless legs syndrome) 02/07/2012  .  DDD (degenerative disc disease), lumbar 12/18/2009  . MDD (major depressive disorder), recurrent episode (Chief [name]) 12/10/2007  . POSTHERPETIC NEURALGIA 04/20/2007  . HLD (hyperlipidemia) 11/24/2006  . PLMD (periodic limb movement disorder) 11/24/2006  . Essential hypertension 11/24/2006  . GERD 11/24/2006  . GASTRIC ULCER 11/24/2006  . Osteoarthritis 11/24/2006  . Osteoporosis 11/24/2006    Past Surgical History:  Procedure Laterality Date   . ABDOMINAL HYSTERECTOMY  Age 64 - 76   S/P TAH  . abdominal ultrasound  04/23/2004   Gallstones  . BREAST EXCISIONAL BIOPSY Right 2018   neg/benign complex sclerosing lesion  . BREAST LUMPECTOMY WITH RADIOACTIVE SEED LOCALIZATION Right 10/2016   breast lumpectomy with radioactive seed localization and margin assessment for complex sclerosing lesion [facility])  . BREAST LUMPECTOMY WITH RADIOACTIVE SEED LOCALIZATION Right 11/05/2016   Procedure: RIGHT BREAST LUMPECTOMY WITH RADIOACTIVE SEED LOCALIZATION;  Surgeon: Crystal Skates, MD;  Location: [place];  Service: General;  Laterality: Right;  . CARDIAC CATHETERIZATION    . CARDIOVERSION N/A 01/31/2017   Procedure: CARDIOVERSION;  Surgeon: Crystal Hampshire, MD;  Location: [facility];  Service: Cardiovascular;  Laterality: N/A;  . CARDIOVERSION N/A 03/03/2017   Procedure: CARDIOVERSION;  Surgeon: Crystal Hampshire, MD;  Location: [facility];  Service: Cardiovascular;  Laterality: N/A;  . CARDIOVERSION N/A 04/02/2017   Procedure: CARDIOVERSION;  Surgeon: Crystal Hampshire, MD;  Location: [facility];  Service: Cardiovascular;  Laterality: N/A;  . CARDIOVERSION N/A 01/16/2018   Procedure: CARDIOVERSION;  Surgeon: Crystal Hampshire, MD;  Location: [facility];  Service: Cardiovascular;  Laterality: N/A;  . CATARACT EXTRACTION  05/2010   left eye  . CERVICAL DISCECTOMY  1992   Fusion (Dr. Joya Payne)  . ESI Bilateral 01/2016   S1 transforaminal ESI x3  . ESOPHAGOGASTRODUODENOSCOPY  01/01/2002   with ulcer, bx. neg; + stricture gastric ulcer with hemorrhage  . ESOPHAGOGASTRODUODENOSCOPY  04/23/2004   Esophageal stricture -- dilated  . INTRAVASCULAR PRESSURE WIRE/FFR STUDY N/A 07/12/2019   Procedure: INTRAVASCULAR PRESSURE WIRE/FFR STUDY;  Surgeon: Crystal Hampshire, MD;  Location: [place] [facility];  Service: Cardiovascular;  Laterality: N/A;  . LOWER EXTREMITY ANGIOGRAPHY Left 02/01/2019   LOWER EXTREMITY ANGIOGRAPHY;  Surgeon: Crystal Huxley, MD  . LOWER EXTREMITY ANGIOGRAPHY Right 03/25/2019   Procedure: LOWER EXTREMITY ANGIOGRAPHY;  Surgeon: Crystal Huxley, MD;  Location: [place] [facility];  Service: Cardiovascular;  Laterality: Right;  . LOWER EXTREMITY ANGIOGRAPHY Right 08/02/2019   Procedure: LOWER EXTREMITY ANGIOGRAPHY;  Surgeon: Crystal Huxley, MD;  Location: [place] [facility];  Service: Cardiovascular;  Laterality: Right;  . nuclear stress test  03/2012   no ischemia  . PERIPHERAL VASCULAR BALLOON ANGIOPLASTY Left 01/2019   Percutaneous transluminal angioplasty of left anterior and posterior tibial artery with 2.5 mm diameter by 22 cm length angioplasty balloon ([place])  . RIGHT/LEFT HEART CATH AND CORONARY ANGIOGRAPHY Bilateral 07/12/2019   Procedure: RIGHT/LEFT HEART CATH AND CORONARY ANGIOGRAPHY;  Surgeon: Crystal Hampshire, MD;  Location: [place] [facility];  Service: Cardiovascular;  Laterality: Bilateral;  . SKIN CANCER EXCISION  05/20/2011   BCC from nose  . US ECHOCARDIOGRAPHY  03/2012   in flutter, EF 60%, mild-mod aortic, mitral, tricuspid regurg, mildly dilated LA    Prior to Admission medications   Medication Sig Start Date End Date Taking? Authorizing Provider  acetaminophen (TYLENOL) 500 MG tablet Take 1,000 mg every 8 (eight) hours as needed by mouth for moderate pain.     [provider]  acyclovir (ZOVIRAX) 400 MG tablet Take 400 mg 2 (two) times daily by mouth.     [provider]  amiodarone (PACERONE) 100 MG tablet Take 1 tablet (100 mg total) by mouth daily. Patient taking differently: Take 100 mg by mouth in the morning and at bedtime.  05/28/19   Crystal Hampshire, MD  aspirin EC 81 MG tablet Take 1 tablet (81 mg total) by mouth daily. 02/01/19   Crystal Huxley, MD  atorvastatin (LIPITOR) 20 MG tablet TAKE 1 TABLET BY MOUTH ONCE DAILY 10/14/19   Ria Bush, MD  collagenase (SANTYL) ointment Apply 1 application topically daily. 09/02/19   Felipa Furnace, DPM  DULoxetine  (CYMBALTA) 60 MG capsule TAKE 1 CAPSULE BY MOUTH ONCE DAILY 10/14/19   Ria Bush, MD  ELIQUIS 2.5 MG TABS tablet TAKE 1 TABLET BY MOUTH TWICE DAILY 10/12/19   Crystal Hampshire, MD  ergocalciferol (VITAMIN D2) 1.25 MG (50000 UT) capsule Take 1 capsule (50,000 Units total) by mouth once a week. Patient taking differently: Take 50,000 Units by mouth once a week. Saturday 05/17/19   Ria Bush, MD  furosemide (LASIX) 40 MG tablet Take 1 tablet (40 mg total) by mouth daily. 05/28/19   Crystal Hampshire, MD  gabapentin (NEURONTIN) 300 MG capsule Take 1 capsule (300 mg total) by mouth at bedtime. 05/05/19   Ria Bush, MD  lisinopril (ZESTRIL) 10 MG tablet Take 1 tablet (10 mg total) by mouth daily. 05/28/19   Crystal Hampshire, MD  metoprolol tartrate (LOPRESSOR) 25 MG tablet TAKE 1/2 TABLET BY MOUTH TWICE DAILY 09/16/19   Crystal Hampshire, MD  pantoprazole (PROTONIX) 40 MG tablet TAKE 1 TABLET BY MOUTH EVERY DAY 10/14/19   Ria Bush, MD  potassium chloride SA (KLOR-CON) 20 MEQ tablet Take 0.5 tablets (10 mEq total) by mouth 2 (two) times daily. 07/21/19   Ria Bush, MD  prednisoLONE acetate (PRED FORTE) 1 % ophthalmic suspension Place 1 drop into the left eye daily.     [provider]  rOPINIRole (REQUIP) 1 MG tablet TAKE 2 TABLETS BY MOUTH AT BEDTIME Patient taking differently: Take 2 mg by mouth at bedtime.  06/14/19   Ria Bush, MD  tiZANidine (ZANAFLEX) 2 MG tablet Take 2 mg by mouth every evening.  12/04/17   Ria Bush, MD  traMADol (ULTRAM) 50 MG tablet Take 50 mg by mouth daily.  12/04/17   Ria Bush, MD    Allergies Penicillins  Family History  Problem Relation Age of Onset  . Emphysema Mother   . Heart disease Father        MI  . Stroke Father        first at age 78.  . Stroke Sister   . Diabetes Sister   . Hypertension Brother   . Heart disease Brother        Heart Valve  . Stroke Brother   . Diabetes Brother   . Cancer  Brother        esophageal  . Diabetes Brother   . Diabetes Brother   . Colon cancer Neg Hx     Social History Social History   Tobacco Use  . Smoking status: Former Smoker    Packs/day: 0.25    Years: 1.00    Pack years: 0.25    Quit date: 04/08/1976    Years since quitting: 43.5  . Smokeless tobacco: Never Used  . Tobacco comment: pt smokes occasionally "socially"  Vaping Use  .  Vaping Use: Never used  Substance Use Topics  . Alcohol use: No    Alcohol/week: 1.0 standard drink    Types: 1 Glasses of wine per week  . Drug use: No    Review of Systems  Constitutional: No fever/chills Eyes: No visual changes. ENT: No sore throat. Respiratory: Denies cough Cardiovascular: Denies chest pain Gastrointestinal: Denies abdominal pain Genitourinary: Negative for dysuria. Musculoskeletal: Negative for back pain.  Positive for left wrist pain Skin: Negative for rash. Psychiatric: no mood changes,     ____________________________________________   PHYSICAL EXAM:  VITAL SIGNS: ED Triage Vitals  Enc Vitals Group     BP 10/23/19 1157 140/69     Pulse Rate 10/23/19 1157 72     Resp 10/23/19 1157 16     Temp 10/23/19 1157 98.2 F (36.8 C)     Temp Source 10/23/19 1157 Oral     SpO2 10/23/19 1157 97 %     Weight 10/23/19 1158 142 lb (64.4 kg)     Height 10/23/19 1158 5\' 2"  (1.575 m)     Head Circumference --      Peak Flow --      Pain Score 10/23/19 1158 10     Pain Loc --      Pain Edu? --      Excl. in [place]? --     Constitutional: Alert and oriented. Well appearing and in no acute distress. Eyes: Conjunctivae are normal.  Head: Atraumatic. Nose: No congestion/rhinnorhea. Mouth/Throat: Mucous membranes are moist.   Neck:  supple no lymphadenopathy noted Cardiovascular: Normal rate, regular rhythm. Heart sounds are normal Respiratory: Normal respiratory effort.  No retractions, lungs c t a  GU: deferred Musculoskeletal: Left wrist has deformity is very tender  to palpation, elbow and humerus are not tender, C-spine is nontender, lumbar spine shows a bruise noted along the sacrum, neurovascular is intact, patient is unable to stand without help.  Neurologic:  Normal speech and language.  Skin:  Skin is warm, dry  No rash noted. Psychiatric: Mood and affect are normal. Speech and behavior are normal.  ____________________________________________   LABS (all labs ordered are listed, but only abnormal results are displayed)  Labs Reviewed  COMPREHENSIVE METABOLIC PANEL - Abnormal; Notable for the following components:      Result Value   Sodium 134 (*)    Glucose, Bld 102 (*)    BUN 30 (*)    Creatinine, Ser 2.12 (*)    Calcium 8.7 (*)    GFR calc non Af Amer 20 (*)    GFR calc Af Amer 23 (*)    All other components within normal limits  CBC WITH DIFFERENTIAL/PLATELET - Abnormal; Notable for the following components:   RBC 3.48 (*)    Hemoglobin 11.4 (*)    HCT 34.4 (*)    All other components within normal limits  URINALYSIS, COMPLETE (UACMP) WITH MICROSCOPIC - Abnormal; Notable for the following components:   Color, Urine YELLOW (*)    APPearance CLEAR (*)    All other components within normal limits  SARS CORONAVIRUS 2 BY RT PCR (HOSPITAL ORDER, [facility])   ____________________________________________   ____________________________________________  RADIOLOGY  CT of the head is negative X-ray of the left wrist shows a comminuted fracture of the distal radius and styloid process CT lumbar spine show old compression fractures  ____________________________________________   PROCEDURES  Procedure(s) performed:   .Ortho Injury Treatment  Date/Time: 10/23/2019 3:24 PM Performed by:  Versie Starks, PA-C Authorized by: Versie Starks, PA-C   Consent:    Consent obtained:  Verbal   Consent given by:  Patient   Risks discussed:  Fracture, restricted joint movement, vascular damage and  stiffnessInjury location: wrist Location details: left wrist Injury type: fracture Fracture type: distal radius and ulnar styloid Pre-procedure neurovascular assessment: neurovascularly intact Pre-procedure distal perfusion: normal Pre-procedure neurological function: normal Pre-procedure range of motion: normal Manipulation performed: no Immobilization: splint Splint type: volar short arm Supplies used: cotton padding,  elastic bandage and Ortho-Glass Post-procedure neurovascular assessment: post-procedure neurovascularly intact Post-procedure distal perfusion: normal Post-procedure neurological function: normal Post-procedure range of motion: normal Patient tolerance: patient tolerated the procedure well with no immediate complications       ____________________________________________   INITIAL IMPRESSION / [place] / ED COURSE  Pertinent labs & imaging results that were available during my care of the patient were reviewed by me and considered in my medical decision making (see chart for details).   Patient is a 86 year old female presents emergency department after fall.  See HPI.  Physical exam is consistent with a possible fracture to the left wrist, bruising noted on the sacrum, skin tear to the right forearm, remainder the exam is unremarkable  DDx: Subdural hematoma, fracture wrist, fractured spine,  CT the head is negative, x-ray of the left wrist shows a distal radius and ulna fracture, CT of the lumbar spine shows old compression fractures  I did discuss the wrist x-ray with Dr.Poggi, he recommends splinting and follow-up in the office.  Family is greatly concerned as now she will not be able to use her walker.  We have concerns of her not being able to stand without help. I do feel that patient would benefit from being placed in a skilled nursing facility.  Consult to social worker was ordered  UA, CBC and metabolic panel ordered   UA is normal,  comprehensive metabolic panel shows a increase in her creatinine from 1 month ago, BUN is increased and her GFR is decreased, CBC is normal  I did discuss this with Dr. Ellender Hose, he recommends admission due to her inability to stand and AKI.  Paged hospitalist to agrees for admission. It was discussed with the patient and family. They are agreeable to the treatment plan. I do feel patient will most likely end up in a rehab/skilled nursing facility. Consults for PT also ordered along with consult for social worker.   As part of my medical decision making, I reviewed the following data within the [place] History obtained from family, Nursing notes reviewed and incorporated, Labs reviewed , Old chart reviewed, Radiograph reviewed , Discussed with admitting physician , A consult was requested and obtained from this/these consultant(s) Orthopedics, Evaluated by EM attending Dr. Ellender Hose, Notes from prior ED visits and [place] Controlled Substance Database  ____________________________________________   FINAL CLINICAL IMPRESSION(S) / ED DIAGNOSES  Final diagnoses:  Closed fracture of right wrist, initial encounter  Fall, initial encounter  AKI (acute kidney injury) ([place])  At risk for inadequate pain control      NEW MEDICATIONS STARTED DURING THIS VISIT:  New Prescriptions   No medications on file     Note:  This document was prepared using Dragon voice recognition software and may include unintentional dictation errors.    Versie Starks, PA-C 10/23/19 1724    Duffy Bruce, MD 10/24/19 [phone]

## 2019-10-23 NOTE — ED Triage Notes (Signed)
Pt in via POV, reports mechanical fall at home, injuring left wrist, obvious deformity noted.  Pt is on eliquis, reports hitting head on wall, denies LOC.  A/Ox4, NAD noted at this time.

## 2019-10-23 NOTE — ED Notes (Signed)
Ashok Cordia PA-C is aware of vital signs.

## 2019-10-23 NOTE — Social Work (Signed)
TOC CM/SW consult received for SNF.

## 2019-10-23 NOTE — ED Notes (Signed)
Patient's pulse ox dropped to 88%. 2L O2 via [place] applied.

## 2019-10-23 NOTE — H&P (Signed)
History and Physical    [name]:[id number] DOB: [birth year] DOA: 10/23/2019  PCP: Ria Bush, MD  Patient coming from: Home.   I have personally briefly reviewed patient's old medical records in [place]  Chief Complaint: fall at home.   HPI: [name] is a 86 y.o. female with medical history significant of, GERD, essential hypertension vitamin B12 deficiency, peripheral vascular disease, peripheral neuropathy, s/p angioplasty to right peroneal and right posterior tibial arteries, osteoporosis with history of compression fractures T5 and L1, chronic ulcer on the right foot follows up with podiatry and wound care, Hyperlipidemia, persistent atrial fibrillation , RLS, CAD, s/p recent left and right heart cath, presents to ED after a fall. It was unwitnessed. Pt is hard of hearing and history is unlimited. As per the daughter at bedside, she has a h/o recurrent falls, unsteady on her feet, fell twice in the last week, was walking with her walker at home, had a mechanical fall. Pt called the neighbor who called the daughter living in [place]. Patient reports falls on her left arm, and has left wrist pain. She denies any chest pain, sob, nausea, vomiting or abdominal pain, she denies feeling dizzy prior to the fall, no dysuria, no diarrhea, no melena , no headache, or dizziness or blurry vision.  ED Course: on arrival to ED, she was afebrile, normotensive,. Labs revealed sodium of 134, potassium of 4.6, creatinine of 2.12, hemoglobin of 11.4.  X rays of the wrist show Comminuted, impacted fracture of the distal radial metaphysis and epiphysis with extension into the radiocarpal joint. Nondisplaced fracture at the base of the ulnar styloid. Dorsal angulation of the distal fragments of the distal radius fracture without significant displacement. There is also a possible dorsal carpal avulsion fracture.  CT head is negative for acute intracranial abn.   CT lumbar spine  shows Remote T12 and L1 compression fractures are unchanged. Lumbar degenerative disease appears worst at L4-5 and unchanged. Dr Roland Rack with orthopedics consulted in ED, recommended splinting and follow up in the office.  She was referred to [facility] for admission for AKI and pain control and PT evaluation.   Review of Systems: As per HPI otherwise  "All others reviewed and are negative,"  Past Medical History:  Diagnosis Date  . Abdominal aortic atherosclerosis ([place]) 09/2015   by xray  . Anxiety   . BCC (basal cell carcinoma of skin) 05/2011   on nose  . Breast lesion    a. diagnosed with complex sclerosing lesion with calcifications of the right breast in 08/2016  . Chest pain    a. nuclear stress test 03/2012: normal; b. 09/2018 MV: EF >65%, no isch/scar.   . DDD (degenerative disc disease), lumbosacral 12/06/1992  . Gallstones 09/2015   incidentally by xray  . GERD (gastroesophageal reflux disease) 05/1999  . HERPES ZOSTER 04/13/2007   Qualifier: Diagnosis of  By: [name], [name]   . Hiatal hernia   . History of shingles    ophthalmic, takes acyclovir daily  . Hyperlipidemia 12/2003  . Hypertension 1990  . Mitral regurgitation    a. echo 03/2012: EF 60-65%, no RWMA, mild to mod AI/MR, mod TR, PASP 37 mmHg  . Osteoarthritis 08/21/1989  . Osteoporosis with fracture 11/1997   with compression fracture T12  . Persistent atrial fibrillation ([place]) 03/16/2012   a. CHADS2VASc => 5 (HTN, age x 2, vascular disease, sex category)-->Eliquis 5 BID;  b. Recurrent AF in 06/2016;  c. 01/2017 s/p  DCCV;  d. 02/2017 recurrent AFib->Amio added->DCCV; e. 03/2017 recurrent AFib, s/p reload of amio and DCCV 04/02/2017  . Rheumatoid arthritis ([place])   . RLS (restless legs syndrome)     Past Surgical History:  Procedure Laterality Date  . ABDOMINAL HYSTERECTOMY  Age 8 - 62   S/P TAH  . abdominal ultrasound  04/23/2004   Gallstones  . BREAST EXCISIONAL BIOPSY Right 2018   neg/benign  complex sclerosing lesion  . BREAST LUMPECTOMY WITH RADIOACTIVE SEED LOCALIZATION Right 10/2016   breast lumpectomy with radioactive seed localization and margin assessment for complex sclerosing lesion [facility])  . BREAST LUMPECTOMY WITH RADIOACTIVE SEED LOCALIZATION Right 11/05/2016   Procedure: RIGHT BREAST LUMPECTOMY WITH RADIOACTIVE SEED LOCALIZATION;  Surgeon: Fanny Skates, MD;  Location: [place];  Service: General;  Laterality: Right;  . CARDIAC CATHETERIZATION    . CARDIOVERSION N/A 01/31/2017   Procedure: CARDIOVERSION;  Surgeon: Wellington Hampshire, MD;  Location: [facility];  Service: Cardiovascular;  Laterality: N/A;  . CARDIOVERSION N/A 03/03/2017   Procedure: CARDIOVERSION;  Surgeon: Wellington Hampshire, MD;  Location: [place] [facility];  Service: Cardiovascular;  Laterality: N/A;  . CARDIOVERSION N/A 04/02/2017   Procedure: CARDIOVERSION;  Surgeon: Wellington Hampshire, MD;  Location: [facility];  Service: Cardiovascular;  Laterality: N/A;  . CARDIOVERSION N/A 01/16/2018   Procedure: CARDIOVERSION;  Surgeon: Wellington Hampshire, MD;  Location: [facility];  Service: Cardiovascular;  Laterality: N/A;  . CATARACT EXTRACTION  05/2010   left eye  . CERVICAL DISCECTOMY  1992   Fusion (Dr. Joya Salm)  . ESI Bilateral 01/2016   S1 transforaminal ESI x3  . ESOPHAGOGASTRODUODENOSCOPY  01/01/2002   with ulcer, bx. neg; + stricture gastric ulcer with hemorrhage  . ESOPHAGOGASTRODUODENOSCOPY  04/23/2004   Esophageal stricture -- dilated  . INTRAVASCULAR PRESSURE WIRE/FFR STUDY N/A 07/12/2019   Procedure: INTRAVASCULAR PRESSURE WIRE/FFR STUDY;  Surgeon: Wellington Hampshire, MD;  Location: [place] [facility];  Service: Cardiovascular;  Laterality: N/A;  . LOWER EXTREMITY ANGIOGRAPHY Left 02/01/2019   LOWER EXTREMITY ANGIOGRAPHY;  Surgeon: Algernon Huxley, MD  . LOWER EXTREMITY ANGIOGRAPHY Right 03/25/2019   Procedure: LOWER EXTREMITY ANGIOGRAPHY;  Surgeon: Algernon Huxley, MD;  Location: [place]  CV LAB;  Service: Cardiovascular;  Laterality: Right;  . LOWER EXTREMITY ANGIOGRAPHY Right 08/02/2019   Procedure: LOWER EXTREMITY ANGIOGRAPHY;  Surgeon: Algernon Huxley, MD;  Location: [place] CV [facility];  Service: Cardiovascular;  Laterality: Right;  . nuclear stress test  03/2012   no ischemia  . PERIPHERAL VASCULAR BALLOON ANGIOPLASTY Left 01/2019   Percutaneous transluminal angioplasty of left anterior and posterior tibial artery with 2.5 mm diameter by 22 cm length angioplasty balloon ([place])  . RIGHT/LEFT HEART CATH AND CORONARY ANGIOGRAPHY Bilateral 07/12/2019   Procedure: RIGHT/LEFT HEART CATH AND CORONARY ANGIOGRAPHY;  Surgeon: Wellington Hampshire, MD;  Location: [place] CV [facility];  Service: Cardiovascular;  Laterality: Bilateral;  . SKIN CANCER EXCISION  05/20/2011   BCC from nose  . US ECHOCARDIOGRAPHY  03/2012   in [place], EF 60%, mild-mod aortic, mitral, tricuspid regurg, mildly dilated LA    Social History  reports that she quit smoking about 43 years ago. She has a 0.25 pack-year smoking history. She has never used smokeless tobacco. She reports that she does not drink alcohol and does not use drugs.  Allergies  Allergen Reactions  . Penicillins Rash and Other (See Comments)    Childhood Allergy Has patient had a PCN reaction causing immediate rash, facial/tongue/throat  swelling, SOB or lightheadedness with hypotension: Yes Has patient had a PCN reaction causing severe rash involving mucus membranes or skin necrosis: Unknown Has patient had a PCN reaction that required hospitalization: No Has patient had a PCN reaction occurring within the last 10 years: No If all of the above answers are "NO", then may proceed with Cephalosporin use.     Family History  Problem Relation Age of Onset  . Emphysema Mother   . Heart disease Father        MI  . Stroke Father        first at age 70.  . Stroke Sister   . Diabetes Sister   . Hypertension Brother   . Heart disease Brother         Heart Valve  . Stroke Brother   . Diabetes Brother   . Cancer Brother        esophageal  . Diabetes Brother   . Diabetes Brother   . Colon cancer Neg Hx   family history reviewed .   Prior to Admission medications   Medication Sig Start Date End Date Taking? Authorizing Provider  acetaminophen (TYLENOL) 500 MG tablet Take 1,000 mg every 8 (eight) hours as needed by mouth for moderate pain.     [provider]  acyclovir (ZOVIRAX) 400 MG tablet Take 400 mg 2 (two) times daily by mouth.     [provider]  amiodarone (PACERONE) 100 MG tablet Take 1 tablet (100 mg total) by mouth daily. Patient taking differently: Take 100 mg by mouth in the morning and at bedtime.  05/28/19   Wellington Hampshire, MD  aspirin EC 81 MG tablet Take 1 tablet (81 mg total) by mouth daily. 02/01/19   Algernon Huxley, MD  atorvastatin (LIPITOR) 20 MG tablet TAKE 1 TABLET BY MOUTH ONCE DAILY Patient taking differently: Take 20 mg by mouth daily.  10/14/19   Ria Bush, MD  collagenase (SANTYL) ointment Apply 1 application topically daily. 09/02/19   Felipa Furnace, DPM  DULoxetine (CYMBALTA) 60 MG capsule TAKE 1 CAPSULE BY MOUTH ONCE DAILY Patient taking differently: Take 60 mg by mouth daily.  10/14/19   Ria Bush, MD  ELIQUIS 2.5 MG TABS tablet TAKE 1 TABLET BY MOUTH TWICE DAILY Patient taking differently: Take 2.5 mg by mouth 2 (two) times daily.  10/12/19   Wellington Hampshire, MD  ergocalciferol (VITAMIN D2) 1.25 MG (50000 UT) capsule Take 1 capsule (50,000 Units total) by mouth once a week. Patient taking differently: Take 50,000 Units by mouth once a week. Saturday 05/17/19   Ria Bush, MD  furosemide (LASIX) 40 MG tablet Take 1 tablet (40 mg total) by mouth daily. 05/28/19   Wellington Hampshire, MD  gabapentin (NEURONTIN) 300 MG capsule Take 1 capsule (300 mg total) by mouth at bedtime. 05/05/19   Ria Bush, MD  lisinopril (ZESTRIL) 10 MG tablet Take 1 tablet (10 mg  total) by mouth daily. 05/28/19   Wellington Hampshire, MD  metoprolol tartrate (LOPRESSOR) 25 MG tablet TAKE 1/2 TABLET BY MOUTH TWICE DAILY Patient taking differently: Take 12.5 mg by mouth 2 (two) times daily.  09/16/19   Wellington Hampshire, MD  pantoprazole (PROTONIX) 40 MG tablet TAKE 1 TABLET BY MOUTH EVERY DAY Patient taking differently: Take 40 mg by mouth daily.  10/14/19   Ria Bush, MD  potassium chloride (KLOR-CON) 10 MEQ tablet Take 5 mEq by mouth 2 (two) times daily. 09/16/19   [provider]  potassium chloride SA (KLOR-CON) 20 MEQ tablet Take 0.5 tablets (10 mEq total) by mouth 2 (two) times daily. 07/21/19   Ria Bush, MD  prednisoLONE acetate (PRED FORTE) 1 % ophthalmic suspension Place 1 drop into the left eye daily.     [provider]  rOPINIRole (REQUIP) 1 MG tablet TAKE 2 TABLETS BY MOUTH AT BEDTIME Patient taking differently: Take 2 mg by mouth at bedtime.  06/14/19   Ria Bush, MD  sulfamethoxazole-trimethoprim (BACTRIM DS) 800-160 MG tablet Take 1 tablet by mouth 2 (two) times daily. 10/12/19   [provider]  tiZANidine (ZANAFLEX) 2 MG tablet Take 2 mg by mouth every evening.  12/04/17   Ria Bush, MD  traMADol (ULTRAM) 50 MG tablet Take 50 mg by mouth daily.  12/04/17   Ria Bush, MD    Physical Exam: Vitals:   10/23/19 1157 10/23/19 1158 10/23/19 1445 10/23/19 1733  BP: 140/69  138/70 (!) 102/51  Pulse: 72  70 (!) 51  Resp: 16  16 18   Temp: 98.2 F (36.8 C)     TempSrc: Oral     SpO2: 97%  98% 100%  Weight:  64.4 kg    Height:  5\' 2"  (1.575 m)      Constitutional:in mild distress from wrist pain.  Vitals:   10/23/19 1157 10/23/19 1158 10/23/19 1445 10/23/19 1733  BP: 140/69  138/70 (!) 102/51  Pulse: 72  70 (!) 51  Resp: 16  16 18   Temp: 98.2 F (36.8 C)     TempSrc: Oral     SpO2: 97%  98% 100%  Weight:  64.4 kg    Height:  5\' 2"  (1.575 m)     Eyes: PERRL, lids and conjunctivae normal ENMT:  Mucous membranes are dry.  Respiratory: clear to auscultation bilaterally, no wheezing, no crackles. Normal respiratory effort. No accessory muscle use.  Cardiovascular: Regular rate and rhythm,  No extremity edema. Abdomen: no tenderness, no masses palpated. . Bowel sounds positive.  Musculoskeletal: left arm in splint, painful ROM of the left wrist.  Skin: round clean ulcer on the lateral aspect of the right foot without any foul smelling drainage.  Neurologic: CN 2-12 grossly intact. , abnormal sensory deficits in bilateral feet at baseline.  Psychiatric: mood is appropriate.  Labs on Admission: I have personally reviewed following labs and imaging studies  CBC: Recent Labs  Lab 10/23/19 1539  WBC 6.5  NEUTROABS 5.0  HGB 11.4*  HCT 34.4*  MCV 98.9  PLT 825    Basic Metabolic Panel: Recent Labs  Lab 10/23/19 1539  NA 134*  K 4.6  CL 98  CO2 28  GLUCOSE 102*  BUN 30*  CREATININE 2.12*  CALCIUM 8.7*    GFR: Estimated Creatinine Clearance: 16.2 mL/min (A) (by C-G formula based on SCr of 2.12 mg/dL (H)).  Liver Function Tests: Recent Labs  Lab 10/23/19 1539  AST 20  ALT 18  ALKPHOS 45  BILITOT 0.5  PROT 6.8  ALBUMIN 4.0    Urine analysis:    Component Value Date/Time   COLORURINE YELLOW (A) 10/23/2019 1539   APPEARANCEUR CLEAR (A) 10/23/2019 1539   LABSPEC 1.011 10/23/2019 1539   PHURINE 6.0 10/23/2019 1539   GLUCOSEU NEGATIVE 10/23/2019 1539   HGBUR NEGATIVE 10/23/2019 1539   BILIRUBINUR NEGATIVE 10/23/2019 1539   BILIRUBINUR negative 05/05/2019 [place] 10/23/2019 1539   PROTEINUR NEGATIVE 10/23/2019 1539   UROBILINOGEN 0.2 05/05/2019 1716   NITRITE NEGATIVE  10/23/2019 [place] 10/23/2019 1539    Radiological Exams on Admission: DG Wrist Complete Left  Result Date: 10/23/2019 CLINICAL DATA:  Left wrist pain and deformity following a fall. EXAM: LEFT WRIST - COMPLETE 3+ VIEW COMPARISON:  None. FINDINGS:  Comminuted, impacted fracture of the distal radial metaphysis and epiphysis with extension into the radiocarpal joint. Nondisplaced fracture at the base of the ulnar styloid. Dorsal angulation of the distal fragments of the distal radius fracture without significant displacement. There is also a possible dorsal carpal avulsion fracture. IMPRESSION: 1. Distal radius and ulnar styloid fractures, as described above. 2. Possible dorsal carpal avulsion fracture. Electronically Signed   By: Claudie Revering M.D.   On: 10/23/2019 12:56   CT Head Wo Contrast  Result Date: 10/23/2019 CLINICAL DATA:  Status post fall today with a blow to the head. Initial encounter. EXAM: CT HEAD WITHOUT CONTRAST TECHNIQUE: Contiguous axial images were obtained from the base of the skull through the vertex without intravenous contrast. COMPARISON:  Head CT 07/03/2018. FINDINGS: Brain: No evidence of acute infarction, hemorrhage, hydrocephalus, extra-axial collection or mass lesion/mass effect. Atrophy and chronic microvascular ischemic change again seen. Vascular: No hyperdense vessel or unexpected calcification. Skull: Intact.  No focal lesion. Sinuses/Orbits: Partial visualization of a mucous retention cyst or polyp in the left maxillary sinus. Other: None. IMPRESSION: No acute abnormality. Atrophy and chronic microvascular ischemic change. Electronically Signed   By: Inge Rise M.D.   On: 10/23/2019 12:50   CT Lumbar Spine Wo Contrast  Result Date: 10/23/2019 CLINICAL DATA:  Low back pain after a fall at home today. Initial encounter. EXAM: CT LUMBAR SPINE WITHOUT CONTRAST TECHNIQUE: Multidetector CT imaging of the lumbar spine was performed without intravenous contrast administration. Multiplanar CT image reconstructions were also generated. COMPARISON:  CT chest, abdomen and pelvis 07/06/2019. FINDINGS: Segmentation: Standard. Alignment: Mild convex left curvature is unchanged.  No listhesis. Vertebrae: No acute fracture. T12  and L1 compression fractures are unchanged. Paraspinal and other soft tissues: Innumerable calcifications in the spleen consistent with old granulomatous disease noted. Extensive atherosclerosis is present. Gallstone is again seen. Disc levels: There is mild bony retropulsion off the superior endplates both Q25 and L1 without stenosis, unchanged. Loss of disc space height and vacuum disc phenomenon at L3-4 and L5-S1 are seen. Central canal stenosis appears worst at L4-5 where there is a broad-based disc bulge and ligamentum flavum thickening. IMPRESSION: No acute abnormality. Remote T12 and L1 compression fractures are unchanged. Lumbar degenerative disease appears worst at L4-5 and unchanged. Electronically Signed   By: Inge Rise M.D.   On: 10/23/2019 15:15    EKG: not done.   Assessment/Plan Active Problems:   Fall  Mechanical unwitnessed fall:  Resulting in the left wrist fracture. S/p splinting and outpatient follow up with Orthopedics.  Meanwhile pain control and PT evaluation in am.    Persistent Atrial fibrillation:  Currently in NSR.  On amiodarone and metoprolol for rate and rhythm control.  On eliquis for anti coagulation.  Last echocardiogram showed preserved left ventricular ejection fraction . Her CHA2DS2-VASc score is 5.    AKI Probably secondary to dehydration/prerenal azotemia Gently hydrate, repeat renal parameters in the morning. Urine analysis is negative for infection Check urine studies. Check ultrasound renal to rule out obstruction Hold Lasix and lisinopril.   Essential hypertension Blood pressure parameters are borderline.    Peripheral vascular disease Continue with aspirin and statin and Eliquis.   History of coronary artery disease Continue  with aspirin and statin Patient currently denies any chest pain or shortness of breath    Mild normocytic anemia Continue to monitor hemoglobin   Hyperlipidemia Continue with  statin   Peripheral neuropathy Continue gabapentin.   Restless leg syndrome Continue with Requip    Chronic right foot ulcer Wound care will be consulted. X-rays of the right foot to check for osteomyelitis.    Chronic compression fractures T5 and L1 PT evaluation and pain control.   GERD Stable    In view of her recurrent falls, multiple co morbidities, debility and deconditioning, recommend palliative care consult for goals of care discussion. Social work consulted for SNF placement   DVT prophylaxis: Eliquis.  Code Status:   FULL CODE.  Family Communication:  Daughter at bedside.  Disposition Plan:   Patient is from: Home   Anticipated DC to:  SNF  Anticipated DC date:  10/25/2019  Anticipated DC barriers: PAIN CONTROL.  Consults called:  None.  Admission status:  Inpatient/  Med surg.  Severity of Illness: The appropriate patient status for this patient is INPATIENT. Inpatient status is judged to be reasonable and necessary in order to provide the required intensity of service to ensure the patient's safety. The patient's presenting symptoms, physical exam findings, and initial radiographic and laboratory data in the context of their chronic comorbidities is felt to place them at high risk for further clinical deterioration. Furthermore, it is not anticipated that the patient will be medically stable for discharge from the hospital within 2 midnights of admission.   * I certify that at the point of admission it is my clinical judgment that the patient will require inpatient hospital care spanning beyond 2 midnights from the point of admission due to high intensity of service, high risk for further deterioration and high frequency of surveillance required.*     Hosie Poisson MD [facility]  How to contact the [facility] Attending or Consulting provider [place] or covering provider during after hours [place], for this patient?   1. Check the care team in [facility] and  look for a) attending/consulting [facility] provider listed and b) the [facility] team listed 2. Log into [email or web address] and use [place]'s universal password to access. If you do not have the password, please contact the hospital operator. 3. Locate the [facility] provider you are looking for under [facility] and page to a number that you can be directly reached. 4. If you still have difficulty reaching the provider, please page the [facility] (Director on Call) for the Hospitalists listed on amion for assistance.  10/23/2019, 6:12 PM

## 2019-10-23 NOTE — ED Notes (Signed)
Patient's relative June's phone number is [phone].

## 2019-10-23 NOTE — ED Notes (Signed)
See triage note  States she went to answer the door    Lost her balance hit her head on wall  No LOC  Also landed on left wrist  Good pulses  She also hit her right forearm  Abrasion/skin tear noted

## 2019-10-24 DIAGNOSIS — I10 Essential (primary) hypertension: Secondary | ICD-10-CM

## 2019-10-24 DIAGNOSIS — N1831 Chronic kidney disease, stage 3a: Secondary | ICD-10-CM

## 2019-10-24 DIAGNOSIS — N178 Other acute kidney failure: Secondary | ICD-10-CM

## 2019-10-24 DIAGNOSIS — S62102A Fracture of unspecified carpal bone, left wrist, initial encounter for closed fracture: Secondary | ICD-10-CM | POA: Diagnosis not present

## 2019-10-24 DIAGNOSIS — N183 Chronic kidney disease, stage 3 unspecified: Secondary | ICD-10-CM | POA: Diagnosis present

## 2019-10-24 DIAGNOSIS — W19XXXA Unspecified fall, initial encounter: Secondary | ICD-10-CM | POA: Diagnosis not present

## 2019-10-24 LAB — CBC
HCT: 36 % (ref 36.0–46.0)
Hemoglobin: 11.8 g/dL — ABNORMAL LOW (ref 12.0–15.0)
MCH: 32.6 pg (ref 26.0–34.0)
MCHC: 32.8 g/dL (ref 30.0–36.0)
MCV: 99.4 fL (ref 80.0–100.0)
Platelets: 221 10*3/uL (ref 150–400)
RBC: 3.62 MIL/uL — ABNORMAL LOW (ref 3.87–5.11)
RDW: 13.8 % (ref 11.5–15.5)
WBC: 8.3 10*3/uL (ref 4.0–10.5)
nRBC: 0 % (ref 0.0–0.2)

## 2019-10-24 LAB — BASIC METABOLIC PANEL
Anion gap: 12 (ref 5–15)
BUN: 35 mg/dL — ABNORMAL HIGH (ref 8–23)
CO2: 25 mmol/L (ref 22–32)
Calcium: 8.5 mg/dL — ABNORMAL LOW (ref 8.9–10.3)
Chloride: 100 mmol/L (ref 98–111)
Creatinine, Ser: 2.47 mg/dL — ABNORMAL HIGH (ref 0.44–1.00)
GFR calc Af Amer: 20 mL/min — ABNORMAL LOW (ref >60)
GFR calc non Af Amer: 17 mL/min — ABNORMAL LOW (ref >60)
Glucose, Bld: 108 mg/dL — ABNORMAL HIGH (ref 70–99)
Potassium: 4.8 mmol/L (ref 3.5–5.1)
Sodium: 137 mmol/L (ref 135–145)

## 2019-10-24 LAB — CK: Total CK: 165 U/L (ref 38–234)

## 2019-10-24 MED ORDER — SULFAMETHOXAZOLE-TRIMETHOPRIM 800-160 MG PO TABS
1.0000 | ORAL_TABLET | Freq: Two times a day (BID) | ORAL | Status: DC
  Administered 2019-10-24 – 2019-10-25 (×2): 1 via ORAL
  Filled 2019-10-24 (×2): qty 1

## 2019-10-24 NOTE — Evaluation (Signed)
Occupational Therapy Evaluation Patient Details Name: [name] MRN: [id number] DOB: 1929-07-21 Today's Date: 10/24/2019    History of Present Illness  [name] is a 86 y.o. female with medical history significant of, GERD, essential hypertension vitamin B12 deficiency, peripheral vascular disease, peripheral neuropathy, s/p angioplasty to right peroneal and right posterior tibial arteries, osteoporosis with history of compression fractures T5 and L1, chronic ulcer on the right foot follows up with podiatry and wound care, Hyperlipidemia, persistent atrial fibrillation , RLS, CAD, s/p recent left and right heart cath, presents to ED after a fall. Pt is hard of hearing and history is limited. As per the daughter at bedside, she has a h/o recurrent falls, unsteady on her feet, fell twice in the last week, was walking with her walker at home, had a mechanical fall. X rays of the wrist show comminuted, impacted fracture of the distal radial metaphysis and epiphysis with extension into the radiocarpal joint. Nondisplaced fracture at the base of the ulnar styloid. Dorsal angulation of the distal fragments of the distal radius fracture without significant displacement. There is also a possible dorsal carpal avulsion fracture.   Clinical Impression   [name] was seen for OT evaluation this date. Pt is poor historian however reports that prior to hospital admission, pt was MOD I for mobility and ADLs using RW and shower seat to bathe. Pt lives alone however states her daughter is available to stay with her 24/7. Pt presents to acute OT demonstrating impaired ADL performance, functional cognition, and functional mobility 2/2 decreased activity tolerance, functional balance/strength/ROM deficits, decreased LB access, and poor insight into defcitis. Pt reports conflicting information (L vs R handed) and does not remember our conversation from begining of session. Pt unaware of deficits and requires  constant re-direction t/o.  Upon entry pt found c L sling off. MAX A to don sling seated EOB - pt required MAX A to maintain sitting balance. Sling noted to be missing wasit strap - RN notified. Pt currently requires SETUP for self-feeding/drinking at bed level. MAX A for LBD at bed level. Anticipate MAX A x2 for ADL t/fs. Pt would benefit from skilled OT to address noted impairments and functional limitations (see below for any additional details) in order to maximize safety and independence while minimizing falls risk and caregiver burden. Upon hospital discharge, recommend STR to maximize pt safety and return to PLOF.     Follow Up Recommendations  SNF    Equipment Recommendations   (TBD next venue of care)    Recommendations for Other Services       Precautions / Restrictions Precautions Precautions: Fall Restrictions Weight Bearing Restrictions: Yes LUE Weight Bearing: Non weight bearing (assumed NWB s/p L wrist fx)      Mobility Bed Mobility Overal bed mobility: Needs Assistance Bed Mobility: Supine to Sit;Sit to Supine     Supine to sit: Max assist;HOB elevated Sit to supine: Max assist      Transfers                 General transfer comment: Not tested 2/2 poor sitting balance    Balance Overall balance assessment: Needs assistance Sitting-balance support: Single extremity supported;Feet supported Sitting balance-Leahy Scale: Zero Sitting balance - Comments: Pt unaware of leaning and when pointed out reports "I can do it" Postural control: Posterior lean;Right lateral lean  ADL either performed or assessed with clinical judgement   ADL Overall ADL's : Needs assistance/impaired                                       General ADL Comments: SETUP for self-feeding/drinking at bed level. MAX A for LBD at bed level. MAX A don sling seated EOB. Anticipate MAX A x2 for ADL t/fs      Vision          Perception     Praxis      Pertinent Vitals/Pain Pain Assessment: Faces Faces Pain Scale: Hurts even more Pain Location: L wrist Pain Descriptors / Indicators: Grimacing;Discomfort Pain Intervention(s): Limited activity within patient's tolerance;Premedicated before session;Repositioned     Hand Dominance  (Pt initially answered L then later stated R )   Extremity/Trunk Assessment Upper Extremity Assessment Upper Extremity Assessment: RUE deficits/detail;LUE deficits/detail RUE Deficits / Details: AROM grossly WFL, generalized weakness LUE: Unable to fully assess due to immobilization (sling off upon entry - correctly donned and noted missing strap - RN notified)   Lower Extremity Assessment Lower Extremity Assessment: Generalized weakness       Communication Communication Communication: HOH   Cognition Arousal/Alertness: Awake/alert Behavior During Therapy:  (Confused) Overall Cognitive Status: No family/caregiver present to determine baseline cognitive functioning Area of Impairment: Memory;Following commands;Safety/judgement;Awareness;Problem solving;Attention                     Memory: Decreased recall of precautions;Decreased short-term memory Following Commands: Follows one step commands inconsistently Safety/Judgement: Decreased awareness of safety;Decreased awareness of deficits   Problem Solving: Slow processing;Difficulty sequencing;Decreased initiation;Requires verbal cues;Requires tactile cues General Comments: Pt reports conflicting information (L vs R handed) and does not remember our conversation from begining of session. Pt unaware of deficits and requires constant re-direction t/o.    General Comments       Exercises Exercises: Other exercises Other Exercises Other Exercises: Pt educated re: OT role, DME recs, d/c recs, sling donning technique, proper positioning for LUE  Other Exercises: UBD, LBD, self-feeding/drinking, sup<>sit, sitting  balance/tolerance, don sling   Shoulder Instructions      Home Living Family/patient expects to be discharged to:: Private residence Living Arrangements: Alone Available Help at Discharge: Family;Available 24 hours/day Type of Home: Apartment Home Access: Level entry     Home Layout: One level     Bathroom Shower/Tub: Tub/shower unit         Home Equipment: Environmental consultant - 2 wheels;Shower seat          Prior Functioning/Environment Level of Independence: Independent with assistive device(s)        Comments: Pt reports MOD I c RW but inconsistent historian         OT Problem List: Decreased strength;Decreased range of motion;Decreased activity tolerance;Impaired balance (sitting and/or standing);Decreased cognition;Decreased safety awareness;Decreased knowledge of precautions;Impaired UE functional use      OT Treatment/Interventions: Self-care/ADL training;Therapeutic exercise;Energy conservation;DME and/or AE instruction;Therapeutic activities;Patient/family education;Balance training    OT Goals(Current goals can be found in the care plan section) Acute Rehab OT Goals Patient Stated Goal: To return home  OT Goal Formulation: With patient Time For Goal Achievement: 11/07/19 Potential to Achieve Goals: Fair ADL Goals Pt Will Perform Eating: with set-up;bed level Pt Will Perform Upper Body Dressing: with mod assist;sitting Pt Will Transfer to Toilet: with min assist (rolling at bed level)  OT Frequency: Min  1X/week   Barriers to D/C: Inaccessible home environment          Co-evaluation              AM-PAC OT "6 Clicks" Daily Activity     Outcome Measure Help from another person eating meals?: A Little Help from another person taking care of personal grooming?: A Little Help from another person toileting, which includes using toliet, bedpan, or urinal?: A Lot Help from another person bathing (including washing, rinsing, drying)?: A Lot Help from another  person to put on and taking off regular upper body clothing?: A Lot Help from another person to put on and taking off regular lower body clothing?: A Lot 6 Click Score: 14   End of Session Equipment Utilized During Treatment: Oxygen (3L ) Nurse Communication: Mobility status (Pt missing strap for sling )  Activity Tolerance: Other (comment) (Pt limited by confusion) Patient left: in bed;with call bell/phone within reach;with bed alarm set  OT Visit Diagnosis: Unsteadiness on feet (R26.81);History of falling (Z91.81);Muscle weakness (generalized) (M62.81)                Time: 9802-2179 OT Time Calculation (min): 23 min Charges:  OT General Charges $OT Visit: 1 Visit OT Evaluation $OT Eval Moderate Complexity: 1 Mod OT Treatments $Self Care/Home Management : 8-22 mins  Dessie Coma, M.S. OTR/L  10/24/19, 11:07 AM

## 2019-10-24 NOTE — Evaluation (Signed)
Physical Therapy Evaluation Patient Details Name: [name] MRN: [id number] DOB: Oct 25, 1929 Today's Date: 10/24/2019   History of Present Illness   [name] is a 86 y.o. female with medical history significant of, GERD, essential hypertension vitamin B12 deficiency, peripheral vascular disease, peripheral neuropathy, s/p angioplasty to right peroneal and right posterior tibial arteries, osteoporosis with history of compression fractures T5 and L1, chronic ulcer on the right foot follows up with podiatry and wound care, Hyperlipidemia, persistent atrial fibrillation , RLS, CAD, s/p recent left and right heart cath, presents to ED after a fall. It was unwitnessed. Pt is hard of hearing and history is limited. As per the daughter at bedside, she has a h/o recurrent falls, unsteady on her feet, fell twice in the last week, was walking with her walker at home, had a mechanical fall. X rays of the wrist show Comminuted, impacted fracture of the distal radial metaphysis and epiphysis with extension into the radiocarpal joint. Nondisplaced fracture at the base of the ulnar styloid. Dorsal angulation of the distal fragments of the distal radius fracture without significant displacement. There is also a possible dorsal carpal avulsion fracture.  Clinical Impression  Pt is more oriented this afternoon and is able to participate in transfers with good carry over of cuing. Patient is modA for supine <> sit with HOB elevated and use of handrails. Patient is maxA STS and stand pivot, though she produces good effort through transfer and is motivated to complete. Patient requires modA for positioning in bed and chair. PT recommendation in SNF placement d/t lack of safety and ind in basic transfers, and fatigue following. Patient unable to attempt ambulation at this time. Would benefit from skilled PT to address above deficits and promote optimal return to PLOF.     Follow Up Recommendations SNF     Equipment Recommendations  Rolling walker with 5" wheels    Recommendations for Other Services       Precautions / Restrictions Precautions Precautions: Fall Restrictions Weight Bearing Restrictions: Yes LUE Weight Bearing: Non weight bearing (assumed NWB s/p L wrist fx)      Mobility  Bed Mobility Overal bed mobility: Needs Assistance Bed Mobility: Supine to Sit;Sit to Supine     Supine to sit: Max assist;HOB elevated Sit to supine: Mod assist   General bed mobility comments: Patient requires modA with supine > sit using RUE to push from sidelying to sit; heavy modA from LUE d/t NWB precautions and decreased core strength  Transfers Overall transfer level: Needs assistance   Transfers: Sit to/from Stand;Stand Pivot Transfers Sit to Stand: Max assist Stand pivot transfers: Max assist       General transfer comment: Patient is able to stand with PT support with good effort and participation minA to remain standing following maxA transfer; maxA for stand pivot  Ambulation/Gait                Stairs            Wheelchair Mobility    Modified Rankin (Stroke Patients Only)       Balance Overall balance assessment: Needs assistance Sitting-balance support: Single extremity supported;Feet supported Sitting balance-Leahy Scale: Fair Sitting balance - Comments: Pt requires heavy assistance for set up but following is able to maintain balance for a couple minutes Postural control: Posterior lean;Right lateral lean Standing balance support: Single extremity supported;During functional activity Standing balance-Leahy Scale: Zero  Pertinent Vitals/Pain Pain Assessment: Faces Faces Pain Scale: Hurts even more Pain Location: L wrist Pain Descriptors / Indicators: Grimacing;Discomfort Pain Intervention(s): Limited activity within patient's tolerance    Home Living Family/patient expects to be discharged to::  Private residence Living Arrangements: Alone Available Help at Discharge: Family;Available 24 hours/day Type of Home: Apartment Home Access: Level entry     Home Layout: One level Home Equipment: Walker - 2 wheels;Shower seat      Prior Function Level of Independence: Independent with assistive device(s)         Comments: Pt reports MOD I c RW but inconsistent historian      Hand Dominance   Dominant Hand:  (Pt initially answered L then later stated R )    Extremity/Trunk Assessment       Lower Extremity Assessment Lower Extremity Assessment: Generalized weakness       Communication   Communication: HOH  Cognition Arousal/Alertness: Awake/alert Behavior During Therapy:  (Confused) Overall Cognitive Status: No family/caregiver present to determine baseline cognitive functioning Area of Impairment: Memory;Following commands;Safety/judgement;Awareness;Problem solving;Attention                     Memory: Decreased recall of precautions;Decreased short-term memory Following Commands: Follows one step commands inconsistently Safety/Judgement: Decreased awareness of safety;Decreased awareness of deficits   Problem Solving: Slow processing;Difficulty sequencing;Decreased initiation;Requires verbal cues;Requires tactile cues General Comments: Pt reports conflicting information (L vs R handed) and does not remember our conversation from begining of session. Pt unaware of deficits and requires constant re-direction t/o.       General Comments      Exercises Other Exercises Other Exercises: supine <> sit x2 onto each side of bed; heavy modA with sit from L sidelying, modA from right; good carry over of cuing to increase ind with transfer with use of rails Other Exercises: STS maxA with patietn unable to initiate steps but good effort Other Exercises: stand pivot maxA with good participation through transfer   Assessment/Plan    PT Assessment Patient needs  continued PT services  PT Problem List Decreased strength;Decreased range of motion;Decreased mobility;Decreased coordination;Decreased activity tolerance;Decreased balance;Decreased knowledge of use of DME       PT Treatment Interventions DME instruction;Therapeutic activities;Gait training;Therapeutic exercise;Patient/family education;Stair training;Balance training;Functional mobility training;Neuromuscular re-education;Manual techniques    PT Goals (Current goals can be found in the Care Plan section)  Acute Rehab PT Goals Patient Stated Goal: To return home  PT Goal Formulation: With patient Time For Goal Achievement: 11/07/19 Potential to Achieve Goals: Fair    Frequency Min 2X/week   Barriers to discharge Decreased caregiver support      Co-evaluation               AM-PAC PT "6 Clicks" Mobility  Outcome Measure Help needed turning from your back to your side while in a flat bed without using bedrails?: A Lot Help needed moving from lying on your back to sitting on the side of a flat bed without using bedrails?: A Lot Help needed moving to and from a bed to a chair (including a wheelchair)?: A Lot Help needed standing up from a chair using your arms (e.g., wheelchair or bedside chair)?: Total Help needed to walk in hospital room?: Total Help needed climbing 3-5 steps with a railing? : Total 6 Click Score: 9    End of Session Equipment Utilized During Treatment: Gait belt Activity Tolerance: Patient tolerated treatment well Patient left: in chair;with nursing/sitter in room;with chair alarm  set Nurse Communication: Mobility status PT Visit Diagnosis: Unsteadiness on feet (R26.81);Other abnormalities of gait and mobility (R26.89);Repeated falls (R29.6);Difficulty in walking, not elsewhere classified (R26.2);History of falling (Z91.81);Muscle weakness (generalized) (M62.81);Pain Pain - Right/Left: Left Pain - part of body: Arm    Time: 0097-9499 PT Time Calculation  (min) (ACUTE ONLY): 25 min   Charges:   PT Evaluation $PT Eval Moderate Complexity: 1 Mod PT Treatments $Therapeutic Activity: 23-37 mins       Durwin Reges DPT  Durwin Reges 10/24/2019, 1:14 PM

## 2019-10-24 NOTE — Progress Notes (Signed)
PROGRESS NOTE    [name]  QJF:[id number] DOB: [birth year] DOA: 10/23/2019 PCP: Ria Bush, MD    Chief complaint.  Left wrist pain.  Brief Narrative: [name] is a 86 y.o. female with medical history significant of, GERD, essential hypertension vitamin B12 deficiency, peripheral vascular disease, peripheral neuropathy, s/p angioplasty to right peroneal and right posterior tibial arteries, osteoporosis with history of compression fractures T5 and L1, chronic ulcer on the right foot follows up with podiatry and wound care, Hyperlipidemia, persistent atrial fibrillation , RLS, CAD, s/p recent left and right heart cath, presents to ED after a fall. Apparently, patient had a frequent falls with the previous thoracic and lumbar fractures. Upon arriving the emergency room, she was a found to have left the risk of fracture, it was splinted in the emergency room.  She was also found to have acute renal failure with a creatinine of 2.12.  Her previous creatinine was 1.09 in April. She is started on symptomatic treatment, IV fluids.  7/18.  Patient still have  pain in the left wrist.  Her bladder appeared full.  Obtain bladder scan.  Continue IV fluids recheck BMP tomorrow.  Obtain physical therapy/Occupational Therapy.   Assessment & Plan:   Active Problems:   Fall  #1.  Mechanical fall with the left risk of fracture. Patient appears to have multiple falls in the past.  She has multiple old compression fractures in her spines.  She has a new left risk of fracture.  ED has splinted the wrist and will follow up with orthopedics as outpatient after discharge. Will obtain physical therapy occupational therapy.  #2.  Acute kidney injury on chronic kidney disease stage IIIa. Continue IV fluids for now.  Patient bladder seem to be full examination, obtain bladder scan.  Recheck renal function tomorrow.  3.  Essential hypertension. Continue to follow.  4.  Restless leg  syndrome. Continue Requip.  5.  Persistent atrial fibrillation. Continue anticoagulation.   DVT prophylaxis: Eliquis Code Status: full Family Communication: None Disposition Plan:  . Patient came from: Home            . Anticipated d/c place: ? . Barriers to d/c OR conditions which need to be met to effect a safe d/c:   Consultants:   None  Procedures: None Antimicrobials:None  Subjective: Patient still complaining of the left risk of pain.  She has some difficulty with urination.  But her UA was normal.  She has some confusion, she is also hard of hearing. No abdominal pain or nausea vomiting. No fever or chills.  Objective: Vitals:   10/23/19 2300 10/24/19 0256 10/24/19 0643 10/24/19 0820  BP: 100/60 124/62 133/62 (!) 109/54  Pulse: 61 66 76 78  Resp: _0 Temp: 98.3 F (36.8 C) 97.9 F (36.6 C) 98.6 F (37 C) 99 F (37.2 C)  TempSrc: Oral Oral Oral Oral  SpO2: 99% 92% 94% 100%  Weight:      Height:        Intake/Output Summary (Last 24 hours) at 10/24/2019 0826 Last data filed at 10/24/2019 0300 Gross per 24 hour  Intake 499.62 ml  Output --  Net 499.62 ml   Filed Weights   10/23/19 1158  Weight: 64.4 kg    Examination:  General exam: Appears calm and comfortable  Respiratory system: Clear to auscultation. Respiratory effort normal. Cardiovascular system: Irregular.  No JVD, murmurs, rubs, gallops or clicks. No pedal edema. Gastrointestinal system: Abdomen is nondistended,  soft and nontender. No organomegaly or masses felt. Normal bowel sounds heard. Central nervous system: Alert and oriented x2. No focal neurological deficits. Extremities: Symmetric  Skin: No rashes, lesions or ulcers Psychiatry:  Mood & affect appropriate.     Data Reviewed: I have personally reviewed following labs and imaging studies  CBC: Recent Labs  Lab 10/23/19 1539  WBC 6.5  NEUTROABS 5.0  HGB 11.4*  HCT 34.4*  MCV 98.9  PLT 035   Basic Metabolic  Panel: Recent Labs  Lab 10/23/19 1539  NA 134*  K 4.6  CL 98  CO2 28  GLUCOSE 102*  BUN 30*  CREATININE 2.12*  CALCIUM 8.7*   GFR: Estimated Creatinine Clearance: 16.2 mL/min (A) (by C-G formula based on SCr of 2.12 mg/dL (H)). Liver Function Tests: Recent Labs  Lab 10/23/19 1539  AST 20  ALT 18  ALKPHOS 45  BILITOT 0.5  PROT 6.8  ALBUMIN 4.0   No results for input(s): LIPASE, AMYLASE in the last 168 hours. No results for input(s): AMMONIA in the last 168 hours. Coagulation Profile: No results for input(s): INR, PROTIME in the last 168 hours. Cardiac Enzymes: No results for input(s): CKTOTAL, CKMB, CKMBINDEX, TROPONINI in the last 168 hours. BNP (last 3 results) No results for input(s): PROBNP in the last 8760 hours. HbA1C: No results for input(s): HGBA1C in the last 72 hours. CBG: No results for input(s): GLUCAP in the last 168 hours. Lipid Profile: No results for input(s): CHOL, HDL, LDLCALC, TRIG, CHOLHDL, LDLDIRECT in the last 72 hours. Thyroid Function Tests: No results for input(s): TSH, T4TOTAL, FREET4, T3FREE, THYROIDAB in the last 72 hours. Anemia Panel: No results for input(s): VITAMINB12, FOLATE, FERRITIN, TIBC, IRON, RETICCTPCT in the last 72 hours. Sepsis Labs: No results for input(s): PROCALCITON, LATICACIDVEN in the last 168 hours.  Recent Results (from the past 240 hour(s))  SARS Coronavirus 2 by RT PCR (hospital order, performed in [facility] hospital lab) Nasopharyngeal Nasopharyngeal Swab     Status: None   Collection Time: 10/23/19  3:39 PM   Specimen: Nasopharyngeal Swab  Result Value Ref Range Status   SARS Coronavirus 2 NEGATIVE NEGATIVE Final    Comment: (NOTE) SARS-CoV-2 target nucleic acids are NOT DETECTED.  The SARS-CoV-2 RNA is generally detectable in upper and lower respiratory specimens during the acute phase of infection. The lowest concentration of SARS-CoV-2 viral copies this assay can detect is 250 copies / mL. A negative  result does not preclude SARS-CoV-2 infection and should not be used as the sole basis for treatment or other patient management decisions.  A negative result may occur with improper specimen collection / handling, submission of specimen other than nasopharyngeal swab, presence of viral mutation(s) within the areas targeted by this assay, and inadequate number of viral copies (<250 copies / mL). A negative result must be combined with clinical observations, patient history, and epidemiological information.  Fact Sheet for Patients:   StrictlyIdeas.no  Fact Sheet for Healthcare Providers: BankingDealers.co.za  This test is not yet approved or  cleared by the [place] FDA and has been authorized for detection and/or diagnosis of SARS-CoV-2 by FDA under an Emergency Use Authorization (EUA).  This EUA will remain in effect (meaning this test can be used) for the duration of the COVID-19 declaration under Section 564(b)(1) of the Act, 21 U.S.C. section 360bbb-3(b)(1), unless the authorization is terminated or revoked sooner.  Performed at [facility], [address] [place], [place] [address]  Radiology Studies: DG Wrist Complete Left  Result Date: 10/23/2019 CLINICAL DATA:  Left wrist pain and deformity following a fall. EXAM: LEFT WRIST - COMPLETE 3+ VIEW COMPARISON:  None. FINDINGS: Comminuted, impacted fracture of the distal radial metaphysis and epiphysis with extension into the radiocarpal joint. Nondisplaced fracture at the base of the ulnar styloid. Dorsal angulation of the distal fragments of the distal radius fracture without significant displacement. There is also a possible dorsal carpal avulsion fracture. IMPRESSION: 1. Distal radius and ulnar styloid fractures, as described above. 2. Possible dorsal carpal avulsion fracture. Electronically Signed   By: Claudie Revering M.D.   On: 10/23/2019 12:56   CT  Head Wo Contrast  Result Date: 10/23/2019 CLINICAL DATA:  Status post fall today with a blow to the head. Initial encounter. EXAM: CT HEAD WITHOUT CONTRAST TECHNIQUE: Contiguous axial images were obtained from the base of the skull through the vertex without intravenous contrast. COMPARISON:  Head CT 07/03/2018. FINDINGS: Brain: No evidence of acute infarction, hemorrhage, hydrocephalus, extra-axial collection or mass lesion/mass effect. Atrophy and chronic microvascular ischemic change again seen. Vascular: No hyperdense vessel or unexpected calcification. Skull: Intact.  No focal lesion. Sinuses/Orbits: Partial visualization of a mucous retention cyst or polyp in the left maxillary sinus. Other: None. IMPRESSION: No acute abnormality. Atrophy and chronic microvascular ischemic change. Electronically Signed   By: Inge Rise M.D.   On: 10/23/2019 12:50   CT Lumbar Spine Wo Contrast  Result Date: 10/23/2019 CLINICAL DATA:  Low back pain after a fall at home today. Initial encounter. EXAM: CT LUMBAR SPINE WITHOUT CONTRAST TECHNIQUE: Multidetector CT imaging of the lumbar spine was performed without intravenous contrast administration. Multiplanar CT image reconstructions were also generated. COMPARISON:  CT chest, abdomen and pelvis 07/06/2019. FINDINGS: Segmentation: Standard. Alignment: Mild convex left curvature is unchanged.  No listhesis. Vertebrae: No acute fracture. T12 and L1 compression fractures are unchanged. Paraspinal and other soft tissues: Innumerable calcifications in the spleen consistent with old granulomatous disease noted. Extensive atherosclerosis is present. Gallstone is again seen. Disc levels: There is mild bony retropulsion off the superior endplates both K34 and L1 without stenosis, unchanged. Loss of disc space height and vacuum disc phenomenon at L3-4 and L5-S1 are seen. Central canal stenosis appears worst at L4-5 where there is a broad-based disc bulge and ligamentum flavum  thickening. IMPRESSION: No acute abnormality. Remote T12 and L1 compression fractures are unchanged. Lumbar degenerative disease appears worst at L4-5 and unchanged. Electronically Signed   By: Inge Rise M.D.   On: 10/23/2019 15:15   US RENAL  Result Date: 10/23/2019 CLINICAL DATA:  Acute kidney injury. EXAM: RENAL / URINARY TRACT ULTRASOUND COMPLETE COMPARISON:  None. FINDINGS: Right Kidney: Renal measurements: 8.3 x 4.4 x 4.5 cm = volume: 85 mL . Echogenicity within normal limits. No mass or hydronephrosis visualized. Left Kidney: Renal measurements: 8.1 x 4.3 x 4.6 cm = volume: 86 mL. Echogenicity within normal limits. No mass or hydronephrosis visualized. Bladder: Appears normal for degree of bladder distention. Other: None. IMPRESSION: No acute finding sonographically in the bilateral kidneys. Electronically Signed   By: Audie Pinto M.D.   On: 10/23/2019 19:04   DG Foot Complete Right  Result Date: 10/23/2019 CLINICAL DATA:  Foot ulcer, pt states that it just wont heal EXAM: RIGHT FOOT COMPLETE - 3+ VIEW COMPARISON:  Right foot radiographs 09/21/2019 FINDINGS: There is no evidence of fracture or dislocation. There is no evidence of arthropathy or other focal bone abnormality. There is a  soft tissue defect along the lateral aspect of the foot near the head of the fifth metatarsal. No osseous changes in the adjacent bones. IMPRESSION: Soft tissue defect along the lateral aspect of the foot likely corresponding to the patient's ulcer. No osseous abnormality identified radiographically. Electronically Signed   By: Audie Pinto M.D.   On: 10/23/2019 18:30        Scheduled Meds: . amiodarone  100 mg Oral Daily  . apixaban  2.5 mg Oral BID  . aspirin EC  81 mg Oral Daily  . atorvastatin  20 mg Oral Daily  . DULoxetine  60 mg Oral Daily  . gabapentin  300 mg Oral QHS  . pantoprazole  40 mg Oral Daily  . rOPINIRole  2 mg Oral QHS  . tiZANidine  2 mg Oral QPM   Continuous  Infusions: . sodium chloride 75 mL/hr at 10/23/19 1828     LOS: 1 day    Time spent: 26 minutes    Sharen Hones, MD [facility]   To contact the attending provider between 7A-7P or the covering provider during after hours 7P-7A, please log into the web site [email or web address] and access using universal [place] password for that web site. If you do not have the password, please call the hospital operator.  10/24/2019, 8:26 AM

## 2019-10-24 NOTE — Plan of Care (Signed)
  Problem: Education: Goal: Knowledge of General Education information will improve Description Including pain rating scale, medication(s)/side effects and non-pharmacologic comfort measures Outcome: Progressing   

## 2019-10-25 ENCOUNTER — Encounter: Payer: Self-pay | Admitting: Internal Medicine

## 2019-10-25 ENCOUNTER — Telehealth: Payer: Self-pay | Admitting: *Deleted

## 2019-10-25 ENCOUNTER — Encounter: Payer: Self-pay | Admitting: Family Medicine

## 2019-10-25 DIAGNOSIS — Z9189 Other specified personal risk factors, not elsewhere classified: Secondary | ICD-10-CM | POA: Diagnosis not present

## 2019-10-25 DIAGNOSIS — I10 Essential (primary) hypertension: Secondary | ICD-10-CM | POA: Diagnosis not present

## 2019-10-25 DIAGNOSIS — S62102A Fracture of unspecified carpal bone, left wrist, initial encounter for closed fracture: Secondary | ICD-10-CM | POA: Diagnosis not present

## 2019-10-25 DIAGNOSIS — Z7189 Other specified counseling: Secondary | ICD-10-CM

## 2019-10-25 DIAGNOSIS — S62101A Fracture of unspecified carpal bone, right wrist, initial encounter for closed fracture: Secondary | ICD-10-CM

## 2019-10-25 DIAGNOSIS — I5033 Acute on chronic diastolic (congestive) heart failure: Secondary | ICD-10-CM

## 2019-10-25 DIAGNOSIS — N178 Other acute kidney failure: Secondary | ICD-10-CM | POA: Diagnosis not present

## 2019-10-25 DIAGNOSIS — Z515 Encounter for palliative care: Secondary | ICD-10-CM

## 2019-10-25 DIAGNOSIS — J9601 Acute respiratory failure with hypoxia: Secondary | ICD-10-CM

## 2019-10-25 DIAGNOSIS — Z66 Do not resuscitate: Secondary | ICD-10-CM | POA: Insufficient documentation

## 2019-10-25 DIAGNOSIS — I5032 Chronic diastolic (congestive) heart failure: Secondary | ICD-10-CM

## 2019-10-25 DIAGNOSIS — S52502A Unspecified fracture of the lower end of left radius, initial encounter for closed fracture: Secondary | ICD-10-CM | POA: Insufficient documentation

## 2019-10-25 LAB — BASIC METABOLIC PANEL
Anion gap: 4 — ABNORMAL LOW (ref 5–15)
BUN: 36 mg/dL — ABNORMAL HIGH (ref 8–23)
CO2: 25 mmol/L (ref 22–32)
Calcium: 8.3 mg/dL — ABNORMAL LOW (ref 8.9–10.3)
Chloride: 106 mmol/L (ref 98–111)
Creatinine, Ser: 2.08 mg/dL — ABNORMAL HIGH (ref 0.44–1.00)
GFR calc Af Amer: 24 mL/min — ABNORMAL LOW (ref >60)
GFR calc non Af Amer: 21 mL/min — ABNORMAL LOW (ref >60)
Glucose, Bld: 102 mg/dL — ABNORMAL HIGH (ref 70–99)
Potassium: 5 mmol/L (ref 3.5–5.1)
Sodium: 135 mmol/L (ref 135–145)

## 2019-10-25 LAB — MAGNESIUM: Magnesium: 2.2 mg/dL (ref 1.7–2.4)

## 2019-10-25 MED ORDER — TAMSULOSIN HCL 0.4 MG PO CAPS
0.4000 mg | ORAL_CAPSULE | Freq: Every day | ORAL | Status: DC
  Administered 2019-10-25 – 2019-10-27 (×3): 0.4 mg via ORAL
  Filled 2019-10-25 (×3): qty 1

## 2019-10-25 MED ORDER — COLLAGENASE 250 UNIT/GM EX OINT
TOPICAL_OINTMENT | Freq: Every day | CUTANEOUS | Status: DC
  Filled 2019-10-25 (×2): qty 30

## 2019-10-25 MED ORDER — FUROSEMIDE 10 MG/ML IJ SOLN
40.0000 mg | Freq: Once | INTRAMUSCULAR | Status: AC
  Administered 2019-10-25: 40 mg via INTRAVENOUS
  Filled 2019-10-25: qty 4

## 2019-10-25 MED ORDER — CHLORHEXIDINE GLUCONATE CLOTH 2 % EX PADS
6.0000 | MEDICATED_PAD | Freq: Every day | CUTANEOUS | Status: DC
  Administered 2019-10-25 – 2019-10-27 (×3): 6 via TOPICAL

## 2019-10-25 MED ORDER — SULFAMETHOXAZOLE-TRIMETHOPRIM 400-80 MG PO TABS
1.0000 | ORAL_TABLET | Freq: Two times a day (BID) | ORAL | Status: DC
  Administered 2019-10-25 – 2019-10-27 (×4): 1 via ORAL
  Filled 2019-10-25 (×5): qty 1

## 2019-10-25 NOTE — Consult Note (Signed)
ORTHOPAEDIC CONSULTATION  REQUESTING PHYSICIAN: Sharen Hones, MD  Chief Complaint:   Left wrist pain.  History of Present Illness: [name] is a 86 y.o. female with multiple medical problems, including gastroesophageal reflux disease, mitral regurgitation, hypertension, hypercholesterolemia, and peripheral vascular disease who lives independently in a small apartment.  The patient was in her usual state of health until 2 days ago when she apparently lost her balance and fell coming out of her bathroom while reaching for her "rollabout", landing on her outstretched left wrist.  She was brought to the emergency room where x-rays demonstrated a closed dorsally impacted intra-articular fracture of the left distal radius.  The patient was placed into a splint, then subsequently admitted for pain control and further work-up for her balance issues.  The patient denies any associated injuries resulting from the fall.  She did not strike her head or lose consciousness.  She also denies any lightheadedness, dizziness, chest pain, shortness of breath, or other symptoms which may have precipitated her fall.  Past Medical History:  Diagnosis Date  . Abdominal aortic atherosclerosis ([place]) 09/2015   by xray  . Anxiety   . BCC (basal cell carcinoma of skin) 05/2011   on nose  . Breast lesion    a. diagnosed with complex sclerosing lesion with calcifications of the right breast in 08/2016  . Chest pain    a. nuclear stress test 03/2012: normal; b. 09/2018 MV: EF >65%, no isch/scar.   . DDD (degenerative disc disease), lumbosacral 12/06/1992  . Gallstones 09/2015   incidentally by xray  . GERD (gastroesophageal reflux disease) 05/1999  . HERPES ZOSTER 04/13/2007   Qualifier: Diagnosis of  By: [name], [name]   . Hiatal hernia   . History of shingles    ophthalmic, takes acyclovir daily  . Hyperlipidemia 12/2003  . Hypertension  1990  . Mitral regurgitation    a. echo 03/2012: EF 60-65%, no RWMA, mild to mod AI/MR, mod TR, PASP 37 mmHg  . Osteoarthritis 08/21/1989  . Osteoporosis with fracture 11/1997   with compression fracture T12  . Persistent atrial fibrillation ([place]) 03/16/2012   a. CHADS2VASc => 5 (HTN, age x 2, vascular disease, sex category)-->Eliquis 5 BID;  b. Recurrent AF in 06/2016;  c. 01/2017 s/p DCCV;  d. 02/2017 recurrent AFib->Amio added->DCCV; e. 03/2017 recurrent AFib, s/p reload of amio and DCCV 04/02/2017  . Rheumatoid arthritis ([place])   . RLS (restless legs syndrome)    Past Surgical History:  Procedure Laterality Date  . ABDOMINAL HYSTERECTOMY  Age 72 - 24   S/P TAH  . abdominal ultrasound  04/23/2004   Gallstones  . BREAST EXCISIONAL BIOPSY Right 2018   neg/benign complex sclerosing lesion  . BREAST LUMPECTOMY WITH RADIOACTIVE SEED LOCALIZATION Right 10/2016   breast lumpectomy with radioactive seed localization and margin assessment for complex sclerosing lesion [facility]  . BREAST LUMPECTOMY WITH RADIOACTIVE SEED LOCALIZATION Right 11/05/2016   Procedure: RIGHT BREAST LUMPECTOMY WITH RADIOACTIVE SEED LOCALIZATION;  Surgeon: Fanny Skates, MD;  Location: [facility];  Service: General;  Laterality: Right;  . CARDIAC CATHETERIZATION    . CARDIOVERSION N/A 01/31/2017   Procedure: CARDIOVERSION;  Surgeon: Wellington Hampshire, MD;  Location: [facility];  Service: Cardiovascular;  Laterality: N/A;  . CARDIOVERSION N/A 03/03/2017   Procedure: CARDIOVERSION;  Surgeon: Wellington Hampshire, MD;  Location: [facility];  Service: Cardiovascular;  Laterality: N/A;  . CARDIOVERSION N/A 04/02/2017   Procedure: CARDIOVERSION;  Surgeon: Wellington Hampshire, MD;  Location: [facility];  Service: Cardiovascular;  Laterality: N/A;  . CARDIOVERSION N/A 01/16/2018   Procedure: CARDIOVERSION;  Surgeon: Wellington Hampshire, MD;  Location: [facility];  Service: Cardiovascular;  Laterality: N/A;  . CATARACT  EXTRACTION  05/2010   left eye  . CERVICAL DISCECTOMY  1992   Fusion (Dr. Joya Salm)  . ESI Bilateral 01/2016   S1 transforaminal ESI x3  . ESOPHAGOGASTRODUODENOSCOPY  01/01/2002   with ulcer, bx. neg; + stricture gastric ulcer with hemorrhage  . ESOPHAGOGASTRODUODENOSCOPY  04/23/2004   Esophageal stricture -- dilated  . INTRAVASCULAR PRESSURE WIRE/FFR STUDY N/A 07/12/2019   Procedure: INTRAVASCULAR PRESSURE WIRE/FFR STUDY;  Surgeon: Wellington Hampshire, MD;  Location: [place] [facility];  Service: Cardiovascular;  Laterality: N/A;  . LOWER EXTREMITY ANGIOGRAPHY Left 02/01/2019   LOWER EXTREMITY ANGIOGRAPHY;  Surgeon: Algernon Huxley, MD  . LOWER EXTREMITY ANGIOGRAPHY Right 03/25/2019   Procedure: LOWER EXTREMITY ANGIOGRAPHY;  Surgeon: Algernon Huxley, MD;  Location: [place] [facility];  Service: Cardiovascular;  Laterality: Right;  . LOWER EXTREMITY ANGIOGRAPHY Right 08/02/2019   Procedure: LOWER EXTREMITY ANGIOGRAPHY;  Surgeon: Algernon Huxley, MD;  Location: [place] [facility];  Service: Cardiovascular;  Laterality: Right;  . nuclear stress test  03/2012   no ischemia  . PERIPHERAL VASCULAR BALLOON ANGIOPLASTY Left 01/2019   Percutaneous transluminal angioplasty of left anterior and posterior tibial artery with 2.5 mm diameter by 22 cm length angioplasty balloon ([place])  . RIGHT/LEFT HEART CATH AND CORONARY ANGIOGRAPHY Bilateral 07/12/2019   Procedure: RIGHT/LEFT HEART CATH AND CORONARY ANGIOGRAPHY;  Surgeon: Wellington Hampshire, MD;  Location: [place] [facility];  Service: Cardiovascular;  Laterality: Bilateral;  . SKIN CANCER EXCISION  05/20/2011   BCC from nose  . US ECHOCARDIOGRAPHY  03/2012   in flutter, EF 60%, mild-mod aortic, mitral, tricuspid regurg, mildly dilated LA   Social History   Socioeconomic History  . Marital status: Widowed    Spouse name: Not on file  . Number of children: 5  . Years of education: Not on file  . Highest education level: Not on file  Occupational History   . Occupation: Retired - Occupational psychologist: RETIRED  Tobacco Use  . Smoking status: Former Smoker    Packs/day: 0.25    Years: 1.00    Pack years: 0.25    Quit date: 04/08/1976    Years since quitting: 43.5  . Smokeless tobacco: Never Used  . Tobacco comment: pt smokes occasionally "socially"  Vaping Use  . Vaping Use: Never used  Substance and Sexual Activity  . Alcohol use: No    Alcohol/week: 1.0 standard drink    Types: 1 Glasses of wine per week  . Drug use: No  . Sexual activity: Never  Other Topics Concern  . Not on file  Social History Narrative   Lives with grandson who smokes, dog   From [place].  Widowed 01/1999; 1st marriage 25 years / 2nd marriage 13 years.   One son deceased Feb 06, 2023.   Activity: active with dog   Diet: good water, fruits/vegetables daily   Social Determinants of Health   Financial Resource Strain: Low Risk   . Difficulty of Paying Living Expenses: Not hard at all  Food Insecurity: No Food Insecurity  . Worried About Charity fundraiser in the Last Year: Never true  . Ran Out of Food in the Last Year: Never true  Transportation Needs: No Transportation Needs  . Lack of Transportation (Medical):  No  . Lack of Transportation (Non-Medical): No  Physical Activity: Inactive  . Days of Exercise per Week: 0 days  . Minutes of Exercise per Session: 0 min  Stress: No Stress Concern Present  . Feeling of Stress : Not at all  Social Connections:   . Frequency of Communication with Friends and Family:   . Frequency of Social Gatherings with Friends and Family:   . Attends Religious Services:   . Active Member of Clubs or Organizations:   . Attends Archivist Meetings:   Marland Kitchen Marital Status:    Family History  Problem Relation Age of Onset  . Emphysema Mother   . Heart disease Father        MI  . Stroke Father        first at age 109.  . Stroke Sister   . Diabetes Sister   . Hypertension Brother   . Heart disease Brother        Heart  Valve  . Stroke Brother   . Diabetes Brother   . Cancer Brother        esophageal  . Diabetes Brother   . Diabetes Brother   . Colon cancer Neg Hx    Allergies  Allergen Reactions  . Penicillins Rash and Other (See Comments)    Childhood Allergy Has patient had a PCN reaction causing immediate rash, facial/tongue/throat swelling, SOB or lightheadedness with hypotension: Yes Has patient had a PCN reaction causing severe rash involving mucus membranes or skin necrosis: Unknown Has patient had a PCN reaction that required hospitalization: No Has patient had a PCN reaction occurring within the last 10 years: No If all of the above answers are "NO", then may proceed with Cephalosporin use.    Prior to Admission medications   Medication Sig Start Date End Date Taking? Authorizing Provider  acetaminophen (TYLENOL) 500 MG tablet Take 1,000 mg every 8 (eight) hours as needed by mouth for moderate pain.    Yes [provider]  acyclovir (ZOVIRAX) 400 MG tablet Take 400 mg 2 (two) times daily by mouth.    Yes [provider]  amiodarone (PACERONE) 200 MG tablet Take 200 mg by mouth daily. 10/19/19  Yes [provider]  aspirin EC 81 MG tablet Take 1 tablet (81 mg total) by mouth daily. 02/01/19  Yes Dew, Erskine Squibb, MD  atorvastatin (LIPITOR) 20 MG tablet TAKE 1 TABLET BY MOUTH ONCE DAILY Patient taking differently: Take 20 mg by mouth daily.  10/14/19  Yes Ria Bush, MD  collagenase (SANTYL) ointment Apply 1 application topically daily. 09/02/19  Yes Felipa Furnace, DPM  DULoxetine (CYMBALTA) 60 MG capsule TAKE 1 CAPSULE BY MOUTH ONCE DAILY Patient taking differently: Take 60 mg by mouth daily.  10/14/19  Yes Ria Bush, MD  ELIQUIS 2.5 MG TABS tablet TAKE 1 TABLET BY MOUTH TWICE DAILY Patient taking differently: Take 2.5 mg by mouth 2 (two) times daily.  10/12/19  Yes Wellington Hampshire, MD  ergocalciferol (VITAMIN D2) 1.25 MG (50000 UT) capsule Take 1 capsule  (50,000 Units total) by mouth once a week. Patient taking differently: Take 50,000 Units by mouth once a week. Saturday 05/17/19  Yes Ria Bush, MD  furosemide (LASIX) 40 MG tablet Take 1 tablet (40 mg total) by mouth daily. 05/28/19  Yes Wellington Hampshire, MD  gabapentin (NEURONTIN) 300 MG capsule Take 1 capsule (300 mg total) by mouth at bedtime. 05/05/19  Yes Ria Bush, MD  lisinopril (ZESTRIL) 10  MG tablet Take 1 tablet (10 mg total) by mouth daily. 05/28/19  Yes Wellington Hampshire, MD  metoprolol tartrate (LOPRESSOR) 25 MG tablet TAKE 1/2 TABLET BY MOUTH TWICE DAILY Patient taking differently: Take 12.5 mg by mouth 2 (two) times daily.  09/16/19  Yes Wellington Hampshire, MD  pantoprazole (PROTONIX) 40 MG tablet TAKE 1 TABLET BY MOUTH EVERY DAY Patient taking differently: Take 40 mg by mouth daily.  10/14/19  Yes Ria Bush, MD  potassium chloride SA (KLOR-CON) 20 MEQ tablet Take 0.5 tablets (10 mEq total) by mouth 2 (two) times daily. 07/21/19  Yes Ria Bush, MD  prednisoLONE acetate (PRED FORTE) 1 % ophthalmic suspension Place 1 drop into the left eye daily.    Yes [provider]  rOPINIRole (REQUIP) 1 MG tablet TAKE 2 TABLETS BY MOUTH AT BEDTIME Patient taking differently: Take 2 mg by mouth at bedtime.  06/14/19  Yes Ria Bush, MD  sulfamethoxazole-trimethoprim (BACTRIM DS) 800-160 MG tablet Take 1 tablet by mouth 2 (two) times daily. 10/12/19  Yes [provider]  traMADol (ULTRAM) 50 MG tablet Take 50 mg by mouth daily.  12/04/17  Yes Ria Bush, MD  tiZANidine (ZANAFLEX) 2 MG tablet Take 2 mg by mouth every evening.  12/04/17   Ria Bush, MD   CT Lumbar Spine Wo Contrast  Result Date: 10/23/2019 CLINICAL DATA:  Low back pain after a fall at home today. Initial encounter. EXAM: CT LUMBAR SPINE WITHOUT CONTRAST TECHNIQUE: Multidetector CT imaging of the lumbar spine was performed without intravenous contrast administration.  Multiplanar CT image reconstructions were also generated. COMPARISON:  CT chest, abdomen and pelvis 07/06/2019. FINDINGS: Segmentation: Standard. Alignment: Mild convex left curvature is unchanged.  No listhesis. Vertebrae: No acute fracture. T12 and L1 compression fractures are unchanged. Paraspinal and other soft tissues: Innumerable calcifications in the spleen consistent with old granulomatous disease noted. Extensive atherosclerosis is present. Gallstone is again seen. Disc levels: There is mild bony retropulsion off the superior endplates both Z66 and L1 without stenosis, unchanged. Loss of disc space height and vacuum disc phenomenon at L3-4 and L5-S1 are seen. Central canal stenosis appears worst at L4-5 where there is a broad-based disc bulge and ligamentum flavum thickening. IMPRESSION: No acute abnormality. Remote T12 and L1 compression fractures are unchanged. Lumbar degenerative disease appears worst at L4-5 and unchanged. Electronically Signed   By: Inge Rise M.D.   On: 10/23/2019 15:15   US RENAL  Result Date: 10/23/2019 CLINICAL DATA:  Acute kidney injury. EXAM: RENAL / URINARY TRACT ULTRASOUND COMPLETE COMPARISON:  None. FINDINGS: Right Kidney: Renal measurements: 8.3 x 4.4 x 4.5 cm = volume: 85 mL . Echogenicity within normal limits. No mass or hydronephrosis visualized. Left Kidney: Renal measurements: 8.1 x 4.3 x 4.6 cm = volume: 86 mL. Echogenicity within normal limits. No mass or hydronephrosis visualized. Bladder: Appears normal for degree of bladder distention. Other: None. IMPRESSION: No acute finding sonographically in the bilateral kidneys. Electronically Signed   By: Audie Pinto M.D.   On: 10/23/2019 19:04   DG Foot Complete Right  Result Date: 10/23/2019 CLINICAL DATA:  Foot ulcer, pt states that it just wont heal EXAM: RIGHT FOOT COMPLETE - 3+ VIEW COMPARISON:  Right foot radiographs 09/21/2019 FINDINGS: There is no evidence of fracture or dislocation. There is no  evidence of arthropathy or other focal bone abnormality. There is a soft tissue defect along the lateral aspect of the foot near the head of the fifth metatarsal. No osseous  changes in the adjacent bones. IMPRESSION: Soft tissue defect along the lateral aspect of the foot likely corresponding to the patient's ulcer. No osseous abnormality identified radiographically. Electronically Signed   By: Audie Pinto M.D.   On: 10/23/2019 18:30    Positive ROS: All other systems have been reviewed and were otherwise negative with the exception of those mentioned in the HPI and as above.  Physical Exam: General:  Alert, no acute distress Psychiatric:  Patient is competent for consent with normal mood and affect   Cardiovascular:  No pedal edema Respiratory:  No wheezing, non-labored breathing GI:  Abdomen is soft and non-tender Skin:  No lesions in the area of chief complaint Neurologic:  Sensation intact distally Lymphatic:  No axillary or cervical lymphadenopathy  Orthopedic Exam:  Orthopedic examination is limited to the left forearm and hand.  The patient is in a sugar tong splint maintaining the wrist in neutral position.  The splint appears to be in good condition.  The skin is intact at the proximal and distal margins of the splint.  The patient is able to flex and extend all digits, although with some reproduction of wrist pain.  She has intact sensation to light touch to all distributions.  She has good capillary refill to all digits.  X-rays:  Recent x-rays of the left wrist are available for review and have been reviewed by myself.  These films demonstrate a dorsally impacted intra-articular fracture of the distal radius resulting in some distal radial shortening.  In addition, there is a nondisplaced ulnar styloid fracture.  Diffuse osteopenia is noted throughout the bones, but no significant degenerative changes of the radiocarpal joint are noted.  Assessment: Closed dorsally impacted  intra-articular left distal radius fracture with nondisplaced ulnar styloid fracture.  Plan: The treatment options have been discussed with the patient, including both surgical and nonsurgical choices.  The patient would prefer to avoid surgical intervention if at all possible.  Given that this is her nondominant arm and given her age, I do feel that nonsurgical intervention is reasonable and appropriate for her, especially has numerous studies comparing surgical and nonsurgical treatment of distal radius fractures in the elderly has shown no functional difference after 3 to 6 months.  The patient has been advised to keep the splint on and intact and to keep the hand elevated as much as possible.  She may be mobilized with a platform walker on the left side with physical therapy.  She will follow-up in my office in 1 week for re-evaluation and repeat x-rays.  Thank you for asking me to participate in the care of this pleasant woman.  I will look forward to seeing her in 1 week.  Please call me back if you need any further orthopedic assessment/assistance during this hospitalization.   Pascal Lux, MD  Beeper #:  [phone]  10/25/2019 1:46 PM

## 2019-10-25 NOTE — Telephone Encounter (Signed)
Patient's daughter called stating that her mom is at [facility] because she fell and broke her wrist. [name] stated when her mom was taken to the hospital she was asked if she had a DNR. Patient's daughter stated that they thought her mom had one but she has moved and has not been able to run across it. Patient's daughter stated that they would like to have a DNR for her mom completed so that they can hang it on her refrigerator when she goes back home. Patient's daughter wants to know if she can get the DNR form for her mom?

## 2019-10-25 NOTE — Progress Notes (Signed)
PROGRESS NOTE    [name]  GFR:[id number] DOB: Aug 19, 1929 DOA: 10/23/2019 PCP: Ria Bush, MD   Chief complaint.  Left wrist pain.   Brief Narrative:  [name] a 86 y.o.femalewith medical history significant of, GERD, essential hypertension vitamin B12 deficiency, peripheral vascular disease, peripheral neuropathy, s/p angioplasty to right peroneal and right posterior tibial arteries, osteoporosis with history of compression fractures T5 and L1, chronic ulcer on the right foot follows up with podiatry and wound care, Hyperlipidemia, persistent atrial fibrillation , RLS, CAD, s/p recent left and right heart cath, presents to ED after a fall. Apparently, patient had a frequent falls with the previous thoracic and lumbar fractures. Upon arriving the emergency room, she was a found to have left the risk of fracture, it was splinted in the emergency room.  She was also found to have acute renal failure with a creatinine of 2.12.  Her previous creatinine was 1.09 in April. She is started on symptomatic treatment, IV fluids.  7/18.  Patient still have  pain in the left wrist.  Her bladder appeared full.  Obtain bladder scan.  Continue IV fluids recheck BMP tomorrow.  Obtain physical therapy/Occupational Therapy. 7/19.  Bladder scan is performed in my presence today, showed residual 404 mL.  Foley catheter is anchored.  Patient also had increased oxygen requirement overnight, given 40 mg IV Lasix.  Called orthopedics consult as patient left risk is not properly splinted.    Assessment & Plan:   Active Problems:   Essential hypertension   Fall   Acute renal failure superimposed on stage 3a chronic kidney disease (HCC)   Left wrist fracture  #1.  Acute kidney injury on chronic kidney disease stage IIIa. Patient renal function is slightly better.  This is secondary to dehydration the additional to urinary retention.  Patient has a residual of 404 mL.  Foley catheter is  anchored.  Will start Flonase.  Patient has some volume overload today.  Discontinue fluids.  2.  Acute on chronic diastolic congestive heart failure. Patient had an echocardiogram performed in February 2021, showed normal ejection fraction with grade 2 diastolic dysfunction.  Patient has evidence of volume overload.  Will give IV Lasix.  Discontinue fluids.  3.  Acute hypoxemic respiratory failure.  Secondary to volume overload. Patient was requiring 4 L oxygen since last night.  We will give IV Lasix.  4.  Urinary retention. Probably secondary to pain medicine.  Will start Flomax.  5.  Persistent atrial fibrillation. Continue anticoagulation.  6.  Mechanical fall with left wrist fracture.  Patient still have significant pain, left wrist is not properly splinted.  I called orthopedics again to see the patient.  7. Code status: discussed with daughter, she is DO NOT RESUSCITATE status.  8.  Left foot chronic wound with infection. Patient was treated by wound care as outpatient currently on Bactrim.  Reconsult wound care while in the hospital.  DVT prophylaxis: Eliquis Code Status: full Family Communication:  Had a long discussion with patient daughter, all questions answered. Disposition Plan:   Patient came from: Home  Anticipated d/c place: snf?  Barriers to d/c OR conditions which need to be met to effect a safe d/c:   Consultants:   None  Procedures: None Antimicrobials:None   Subjective: Patient still complaining of pain in the left wrist, has some confusion. Significant urinary retention seen today.  Foley catheter anchored. No nausea vomiting. No fever chills  Objective: Vitals:   10/24/19 1519 10/25/19 0033 10/25/19 0829 10/25/19 0859  BP: 119/61 (!) 158/51 (!) 127/92   Pulse: 80 80 66 74  Resp: '16 17 16   ' Temp: 97.8 F (36.6 C) 98.4  F (36.9 C) 98.9 F (37.2 C)   TempSrc:  Oral Oral   SpO2: 98% 97% (!) 87% 94%  Weight:      Height:        Intake/Output Summary (Last 24 hours) at 10/25/2019 0906 Last data filed at 10/24/2019 1349 Gross per 24 hour  Intake 0 ml  Output --  Net 0 ml   Filed Weights   10/23/19 1158  Weight: 64.4 kg    Examination:  General exam: Appears uncomfortable due to to left wrist pain. Respiratory system: Crackles in the base bilaterally. Respiratory effort normal. Cardiovascular system: Irregular, no JVD, murmurs, rubs, gallops or clicks. No pedal edema. Gastrointestinal system: Abdomen is nondistended, soft and nontender. No organomegaly or masses felt. Normal bowel sounds heard. Central nervous system: Alert and oriented x2. No focal neurological deficits. Extremities: Symmetric  Skin: No rashes, lesions or ulcers Psychiatry:  Mood & affect appropriate.     Data Reviewed: I have personally reviewed following labs and imaging studies  CBC: Recent Labs  Lab 10/23/19 1539 10/24/19 0828  WBC 6.5 8.3  NEUTROABS 5.0  --   HGB 11.4* 11.8*  HCT 34.4* 36.0  MCV 98.9 99.4  PLT 235 588   Basic Metabolic Panel: Recent Labs  Lab 10/23/19 1539 10/24/19 0828 10/25/19 0258  NA 134* 137 135  K 4.6 4.8 5.0  CL 98 100 106  CO2 '28 25 25  ' GLUCOSE 102* 108* 102*  BUN 30* 35* 36*  CREATININE 2.12* 2.47* 2.08*  CALCIUM 8.7* 8.5* 8.3*  MG  --   --  2.2   GFR: Estimated Creatinine Clearance: 16.5 mL/min (A) (by C-G formula based on SCr of 2.08 mg/dL (H)). Liver Function Tests: Recent Labs  Lab 10/23/19 1539  AST 20  ALT 18  ALKPHOS 45  BILITOT 0.5  PROT 6.8  ALBUMIN 4.0   No results for input(s): LIPASE, AMYLASE in the last 168 hours. No results for input(s): AMMONIA in the last 168 hours. Coagulation Profile: No results for input(s): INR, PROTIME in the last 168 hours. Cardiac Enzymes: Recent Labs  Lab 10/24/19 0828  CKTOTAL 165   BNP (last 3 results) No  results for input(s): PROBNP in the last 8760 hours. HbA1C: No results for input(s): HGBA1C in the last 72 hours. CBG: No results for input(s): GLUCAP in the last 168 hours. Lipid Profile: No results for input(s): CHOL, HDL, LDLCALC, TRIG, CHOLHDL, LDLDIRECT in the last 72 hours. Thyroid Function Tests: No results for input(s): TSH, T4TOTAL, FREET4, T3FREE, THYROIDAB in the last 72 hours. Anemia Panel: No results for input(s): VITAMINB12, FOLATE, FERRITIN, TIBC, IRON, RETICCTPCT in the last 72 hours. Sepsis Labs: No results for input(s): PROCALCITON, LATICACIDVEN in the last 168 hours.  Recent Results (from the past 240 hour(s))  SARS Coronavirus 2 by RT PCR (hospital order, performed in [facility] hospital lab) Nasopharyngeal Nasopharyngeal Swab  Status: None   Collection Time: 10/23/19  3:39 PM   Specimen: Nasopharyngeal Swab  Result Value Ref Range Status   SARS Coronavirus 2 NEGATIVE NEGATIVE Final    Comment: (NOTE) SARS-CoV-2 target nucleic acids are NOT DETECTED.  The SARS-CoV-2 RNA is generally detectable in upper and lower respiratory specimens during the acute phase of infection. The lowest concentration of SARS-CoV-2 viral copies this assay can detect is 250 copies / mL. A negative result does not preclude SARS-CoV-2 infection and should not be used as the sole basis for treatment or other patient management decisions.  A negative result may occur with improper specimen collection / handling, submission of specimen other than nasopharyngeal swab, presence of viral mutation(s) within the areas targeted by this assay, and inadequate number of viral copies (<250 copies / mL). A negative result must be combined with clinical observations, patient history, and epidemiological information.  Fact Sheet for Patients:   StrictlyIdeas.no  Fact Sheet for Healthcare Providers: BankingDealers.co.za  This test is not yet approved  or  cleared by the [place] FDA and has been authorized for detection and/or diagnosis of SARS-CoV-2 by FDA under an Emergency Use Authorization (EUA).  This EUA will remain in effect (meaning this test can be used) for the duration of the COVID-19 declaration under Section 564(b)(1) of the Act, 21 U.S.C. section 360bbb-3(b)(1), unless the authorization is terminated or revoked sooner.  Performed at [facility], [address] [place], [place] [address]          Radiology Studies: DG Wrist Complete Left  Result Date: 10/23/2019 CLINICAL DATA:  Left wrist pain and deformity following a fall. EXAM: LEFT WRIST - COMPLETE 3+ VIEW COMPARISON:  None. FINDINGS: Comminuted, impacted fracture of the distal radial metaphysis and epiphysis with extension into the radiocarpal joint. Nondisplaced fracture at the base of the ulnar styloid. Dorsal angulation of the distal fragments of the distal radius fracture without significant displacement. There is also a possible dorsal carpal avulsion fracture. IMPRESSION: 1. Distal radius and ulnar styloid fractures, as described above. 2. Possible dorsal carpal avulsion fracture. Electronically Signed   By: Claudie Revering M.D.   On: 10/23/2019 12:56   CT Head Wo Contrast  Result Date: 10/23/2019 CLINICAL DATA:  Status post fall today with a blow to the head. Initial encounter. EXAM: CT HEAD WITHOUT CONTRAST TECHNIQUE: Contiguous axial images were obtained from the base of the skull through the vertex without intravenous contrast. COMPARISON:  Head CT 07/03/2018. FINDINGS: Brain: No evidence of acute infarction, hemorrhage, hydrocephalus, extra-axial collection or mass lesion/mass effect. Atrophy and chronic microvascular ischemic change again seen. Vascular: No hyperdense vessel or unexpected calcification. Skull: Intact.  No focal lesion. Sinuses/Orbits: Partial visualization of a mucous retention cyst or polyp in the left maxillary sinus. Other:  None. IMPRESSION: No acute abnormality. Atrophy and chronic microvascular ischemic change. Electronically Signed   By: Inge Rise M.D.   On: 10/23/2019 12:50   CT Lumbar Spine Wo Contrast  Result Date: 10/23/2019 CLINICAL DATA:  Low back pain after a fall at home today. Initial encounter. EXAM: CT LUMBAR SPINE WITHOUT CONTRAST TECHNIQUE: Multidetector CT imaging of the lumbar spine was performed without intravenous contrast administration. Multiplanar CT image reconstructions were also generated. COMPARISON:  CT chest, abdomen and pelvis 07/06/2019. FINDINGS: Segmentation: Standard. Alignment: Mild convex left curvature is unchanged.  No listhesis. Vertebrae: No acute fracture. T12 and L1 compression fractures are unchanged. Paraspinal and other soft tissues: Innumerable calcifications in the spleen consistent  with old granulomatous disease noted. Extensive atherosclerosis is present. Gallstone is again seen. Disc levels: There is mild bony retropulsion off the superior endplates both K59 and L1 without stenosis, unchanged. Loss of disc space height and vacuum disc phenomenon at L3-4 and L5-S1 are seen. Central canal stenosis appears worst at L4-5 where there is a broad-based disc bulge and ligamentum flavum thickening. IMPRESSION: No acute abnormality. Remote T12 and L1 compression fractures are unchanged. Lumbar degenerative disease appears worst at L4-5 and unchanged. Electronically Signed   By: Inge Rise M.D.   On: 10/23/2019 15:15   US RENAL  Result Date: 10/23/2019 CLINICAL DATA:  Acute kidney injury. EXAM: RENAL / URINARY TRACT ULTRASOUND COMPLETE COMPARISON:  None. FINDINGS: Right Kidney: Renal measurements: 8.3 x 4.4 x 4.5 cm = volume: 85 mL . Echogenicity within normal limits. No mass or hydronephrosis visualized. Left Kidney: Renal measurements: 8.1 x 4.3 x 4.6 cm = volume: 86 mL. Echogenicity within normal limits. No mass or hydronephrosis visualized. Bladder: Appears normal for  degree of bladder distention. Other: None. IMPRESSION: No acute finding sonographically in the bilateral kidneys. Electronically Signed   By: Audie Pinto M.D.   On: 10/23/2019 19:04   DG Foot Complete Right  Result Date: 10/23/2019 CLINICAL DATA:  Foot ulcer, pt states that it just wont heal EXAM: RIGHT FOOT COMPLETE - 3+ VIEW COMPARISON:  Right foot radiographs 09/21/2019 FINDINGS: There is no evidence of fracture or dislocation. There is no evidence of arthropathy or other focal bone abnormality. There is a soft tissue defect along the lateral aspect of the foot near the head of the fifth metatarsal. No osseous changes in the adjacent bones. IMPRESSION: Soft tissue defect along the lateral aspect of the foot likely corresponding to the patient's ulcer. No osseous abnormality identified radiographically. Electronically Signed   By: Audie Pinto M.D.   On: 10/23/2019 18:30        Scheduled Meds: . amiodarone  100 mg Oral Daily  . apixaban  2.5 mg Oral BID  . aspirin EC  81 mg Oral Daily  . atorvastatin  20 mg Oral Daily  . DULoxetine  60 mg Oral Daily  . furosemide  40 mg Intravenous Once  . gabapentin  300 mg Oral QHS  . pantoprazole  40 mg Oral Daily  . rOPINIRole  2 mg Oral QHS  . sulfamethoxazole-trimethoprim  1 tablet Oral Q12H  . tiZANidine  2 mg Oral QPM   Continuous Infusions:   LOS: 2 days    Time spent: 35 minutes    Sharen Hones, MD [facility]   To contact the attending provider between 7A-7P or the covering provider during after hours 7P-7A, please log into the web site [email or web address] and access using universal B and E password for that web site. If you do not have the password, please call the hospital operator.  10/25/2019, 9:06 AM

## 2019-10-25 NOTE — Telephone Encounter (Signed)
I have filled out DNR form for patient and placed in [name]'s box.  Palliative care notes from hospital reviewed. Will review with patient at f/u appointment.

## 2019-10-25 NOTE — TOC Progression Note (Signed)
Transition of Care [facility]) - Progression Note    Patient Details  Name: [name] MRN: [id number] Date of Birth: 1929-06-27  Transition of Care [facility]) CM/SW [place], RN Phone Number: 10/25/2019, 11:01 AM  Clinical Narrative:    Spoke with the patient, she states that she lives alone and really wants to return home, she stated that she has 2 daughters and neighbors that help her, She agreed to do a bed search and discuss Bed options to go to [facility], Bedsearch done       Expected Discharge Plan and Services                                                 Social Determinants of Health (SDOH) Interventions    Readmission Risk Interventions No flowsheet data found.

## 2019-10-25 NOTE — Progress Notes (Signed)
Patient bladder scanned per MD request. Bladder scan showed 435ml even with output in external suction canister 300cc. MD ordered foley catheter d/t urinary retention and x1 lasix IV ordered.

## 2019-10-25 NOTE — TOC Progression Note (Signed)
Transition of Care [facility]) - Progression Note    Patient Details  Name: YAMILI LICHTENWALNER MRN: [id number] Date of Birth: 1929/04/20  Transition of Care [facility]) CM/SW [place], RN Phone Number: 10/25/2019, 2:40 PM  Clinical Narrative:    Met with the patient and reviewed bed offers, she chose to go to [facility], I called her daughter June while in the room and left a VM, The patient stated that she would tell her also when her daughter calls her back I sent the clinical information to [facility] to get insurance approval [phone]       Expected Discharge Plan and Services                                                 Social Determinants of Health (SDOH) Interventions    Readmission Risk Interventions No flowsheet data found.

## 2019-10-25 NOTE — Progress Notes (Signed)
When Probation officer entered patient's room, noted she had removed the splint and ace wrap to left wrist which is fractured. Writer explained importance of leavening the dressing place. Also wound care rounded while writer at bedside and stated she would order santyl to be applied to right lateral foot.

## 2019-10-25 NOTE — Consult Note (Addendum)
Consultation Note Date: 10/25/2019   Patient Name: [name]  DOB: [birth year]  MRN: [id number]  Age / Sex: 86 y.o., female  PCP: Ria Bush, MD Referring Physician: Sharen Hones, MD  Reason for Consultation: Establishing goals of care and Psychosocial/spiritual support  HPI/Patient Profile: 86 y.o. female  with past medical history of HTN/HLD, mitral regurgitation, osteoporosis with T12 compression fracture, persistent A. fib, rheumatoid arthritis, restless leg, GERD, DDD, basal cell carcinoma of nose, anxiety, herpes zoster/shingles ophthalmic with acyclovir daily admitted on 10/23/2019 with fall with closed fracture of left wrist.   Clinical Assessment and Goals of Care: I have reviewed medical records including EPIC notes, labs and imaging, received report from transition of care team, examined the patient and met with patient to discuss diagnosis prognosis, [place], EOL wishes, disposition and options.  I introduced Palliative Medicine as specialized medical care for people living with serious illness. It focuses on providing relief from the symptoms and stress of a serious illness.   We discussed a brief life review of the patient.  [name] has been a widow for about 21 years, her husband died with lung cancer.  She states that her marriage to her second husband was the best thing that happened in her life.  She shares that she has a wonderful blended family and feels very close to her stepchildren.  She lives alone but has support from her family.  She states she lives in senior living apartments, has Meals on Wheels.  Her daughter Manuela Schwartz does her laundry, and her family helps pay for cleaning service.  She tells me that [name] is her healthcare and [facility].   As far as functional and nutritional status, [name] is independent with ADLs, but has assistance with IADLs, see  above.  We talked about [name]'s functional status and her need for short-term rehab.  I share that short-term rehab is covered under her insurance and is a bridge from the hospital safely home.  [name] is having quite a lot of pain with her left wrist.  We talked about as needed medicines for pain and I encouraged her to ask for these when needed.    Questions and concerns were addressed.  The family was encouraged to call with questions or concerns.   Conference with attending, bedside nursing staff, transition of care team related to patient condition, needs, goals of care, disposition.   HCPOA    HCPOA -Mrs. Delaurentis tells me that her stepdaughter, [name] has legal healthcare and [facility] paperwork.  Mrs. Six has a natural daughter Manuela Schwartz, and 2 natural sons Shanon Brow and Dominica Severin, she shares that one son died.  She shares that she is very close with her stepdaughters, June, ina and [name].    SUMMARY OF RECOMMENDATIONS   At this point, treat the treatable but no CPR or intubation. Thankfully she is agreeable to short-term rehab Ultimate goal is to return home Would benefit from outpatient palliative services  Code Status/Advance Care  Planning:  DNR -[place] form completed  Symptom Management:   Per hospitalist, no additional needs at this time.  Palliative Prophylaxis:   Frequent Pain Assessment, Palliative Wound Care and Turn Reposition  Additional Recommendations (Limitations, Scope, Preferences):  Treat the treatable but no CPR or intubation.  Prefer no invasive surgeries if possible.  Psycho-social/Spiritual:   Desire for further Chaplaincy support:no  Additional Recommendations: Caregiving  Support/Resources and Education on Hospice  Prognosis:   Unable to determine, based on outcomes.  1 year or more would not be surprising based on chronic disease burden, functional status.  Would easily qualify for outpatient  palliative services, but not likely qualify for hospice care  Discharge Planning: Thankfully she is agreeable to short-term rehab with the ultimate goal of returning to her own home.      Primary Diagnoses: Present on Admission: . Fall . Essential hypertension . Acute renal failure superimposed on stage 3a chronic kidney disease ([place]) . Left wrist fracture   I have reviewed the medical record, interviewed the patient and family, and examined the patient. The following aspects are pertinent.  Past Medical History:  Diagnosis Date  . Abdominal aortic atherosclerosis ([place]) 09/2015   by xray  . Anxiety   . BCC (basal cell carcinoma of skin) 05/2011   on nose  . Breast lesion    a. diagnosed with complex sclerosing lesion with calcifications of the right breast in 08/2016  . Chest pain    a. nuclear stress test 03/2012: normal; b. 09/2018 MV: EF >65%, no isch/scar.   . DDD (degenerative disc disease), lumbosacral 12/06/1992  . Gallstones 09/2015   incidentally by xray  . GERD (gastroesophageal reflux disease) 05/1999  . HERPES ZOSTER 04/13/2007   Qualifier: Diagnosis of  By: [name], [name]   . Hiatal hernia   . History of shingles    ophthalmic, takes acyclovir daily  . Hyperlipidemia 12/2003  . Hypertension 1990  . Mitral regurgitation    a. echo 03/2012: EF 60-65%, no RWMA, mild to mod AI/MR, mod TR, PASP 37 mmHg  . Osteoarthritis 08/21/1989  . Osteoporosis with fracture 11/1997   with compression fracture T12  . Persistent atrial fibrillation ([place]) 03/16/2012   a. CHADS2VASc => 5 (HTN, age x 2, vascular disease, sex category)-->Eliquis 5 BID;  b. Recurrent AF in 06/2016;  c. 01/2017 s/p DCCV;  d. 02/2017 recurrent AFib->Amio added->DCCV; e. 03/2017 recurrent AFib, s/p reload of amio and DCCV 04/02/2017  . Rheumatoid arthritis ([place])   . RLS (restless legs syndrome)    Social History   Socioeconomic History  . Marital status: Widowed    Spouse name: Not on file   . Number of children: 5  . Years of education: Not on file  . Highest education level: Not on file  Occupational History  . Occupation: Retired - Occupational psychologist: RETIRED  Tobacco Use  . Smoking status: Former Smoker    Packs/day: 0.25    Years: 1.00    Pack years: 0.25    Quit date: 04/08/1976    Years since quitting: 43.5  . Smokeless tobacco: Never Used  . Tobacco comment: pt smokes occasionally "socially"  Vaping Use  . Vaping Use: Never used  Substance and Sexual Activity  . Alcohol use: No    Alcohol/week: 1.0 standard drink    Types: 1 Glasses of wine per week  . Drug use: No  . Sexual activity: Never  Other Topics Concern  . Not on file  Social History Narrative   Lives with grandson who smokes, dog   From [place].  Widowed 01/1999; 1st marriage 25 years / 2nd marriage 13 years.   One son deceased February 03, 2023.   Activity: active with dog   Diet: good water, fruits/vegetables daily   Social Determinants of Health   Financial Resource Strain: Low Risk   . Difficulty of Paying Living Expenses: Not hard at all  Food Insecurity: No Food Insecurity  . Worried About Charity fundraiser in the Last Year: Never true  . Ran Out of Food in the Last Year: Never true  Transportation Needs: No Transportation Needs  . Lack of Transportation (Medical): No  . Lack of Transportation (Non-Medical): No  Physical Activity: Inactive  . Days of Exercise per Week: 0 days  . Minutes of Exercise per Session: 0 min  Stress: No Stress Concern Present  . Feeling of Stress : Not at all  Social Connections:   . Frequency of Communication with Friends and Family:   . Frequency of Social Gatherings with Friends and Family:   . Attends Religious Services:   . Active Member of Clubs or Organizations:   . Attends Archivist Meetings:   Marland Kitchen Marital Status:    Family History  Problem Relation Age of Onset  . Emphysema Mother   . Heart disease Father        MI  . Stroke Father         first at age 19.  . Stroke Sister   . Diabetes Sister   . Hypertension Brother   . Heart disease Brother        Heart Valve  . Stroke Brother   . Diabetes Brother   . Cancer Brother        esophageal  . Diabetes Brother   . Diabetes Brother   . Colon cancer Neg Hx    Scheduled Meds: . amiodarone  100 mg Oral Daily  . apixaban  2.5 mg Oral BID  . aspirin EC  81 mg Oral Daily  . atorvastatin  20 mg Oral Daily  . Chlorhexidine Gluconate Cloth  6 each Topical Daily  . collagenase   Topical Daily  . DULoxetine  60 mg Oral Daily  . gabapentin  300 mg Oral QHS  . pantoprazole  40 mg Oral Daily  . rOPINIRole  2 mg Oral QHS  . sulfamethoxazole-trimethoprim  1 tablet Oral Q12H  . tamsulosin  0.4 mg Oral QPC breakfast  . tiZANidine  2 mg Oral QPM   Continuous Infusions: PRN Meds:.acetaminophen, bisacodyl, ondansetron **OR** ondansetron (ZOFRAN) IV, oxyCODONE, polyethylene glycol, traMADol Medications Prior to Admission:  Prior to Admission medications   Medication Sig Start Date End Date Taking? Authorizing Provider  acetaminophen (TYLENOL) 500 MG tablet Take 1,000 mg every 8 (eight) hours as needed by mouth for moderate pain.    Yes [provider]  acyclovir (ZOVIRAX) 400 MG tablet Take 400 mg 2 (two) times daily by mouth.    Yes [provider]  amiodarone (PACERONE) 200 MG tablet Take 200 mg by mouth daily. 10/19/19  Yes [provider]  aspirin EC 81 MG tablet Take 1 tablet (81 mg total) by mouth daily. 02/01/19  Yes Dew, Erskine Squibb, MD  atorvastatin (LIPITOR) 20 MG tablet TAKE 1 TABLET BY MOUTH ONCE DAILY Patient taking differently: Take 20 mg by mouth daily.  10/14/19  Yes Ria Bush, MD  collagenase (SANTYL) ointment Apply 1 application topically daily. 09/02/19  Yes Felipa Furnace, DPM  DULoxetine (CYMBALTA) 60 MG capsule TAKE 1 CAPSULE BY MOUTH ONCE DAILY Patient taking differently: Take 60 mg by mouth daily.  10/14/19  Yes Ria Bush, MD   ELIQUIS 2.5 MG TABS tablet TAKE 1 TABLET BY MOUTH TWICE DAILY Patient taking differently: Take 2.5 mg by mouth 2 (two) times daily.  10/12/19  Yes [place], MD  ergocalciferol (VITAMIN D2) 1.25 MG (50000 UT) capsule Take 1 capsule (50,000 Units total) by mouth once a week. Patient taking differently: Take 50,000 Units by mouth once a week. Saturday 05/17/19  Yes Ria Bush, MD  furosemide (LASIX) 40 MG tablet Take 1 tablet (40 mg total) by mouth daily. 05/28/19  Yes Wellington [place], MD  gabapentin (NEURONTIN) 300 MG capsule Take 1 capsule (300 mg total) by mouth at bedtime. 05/05/19  Yes Ria Bush, MD  lisinopril (ZESTRIL) 10 MG tablet Take 1 tablet (10 mg total) by mouth daily. 05/28/19  Yes Wellington [place], MD  metoprolol tartrate (LOPRESSOR) 25 MG tablet TAKE 1/2 TABLET BY MOUTH TWICE DAILY Patient taking differently: Take 12.5 mg by mouth 2 (two) times daily.  09/16/19  Yes Wellington [place], MD  pantoprazole (PROTONIX) 40 MG tablet TAKE 1 TABLET BY MOUTH EVERY DAY Patient taking differently: Take 40 mg by mouth daily.  10/14/19  Yes Ria Bush, MD  potassium chloride SA (KLOR-CON) 20 MEQ tablet Take 0.5 tablets (10 mEq total) by mouth 2 (two) times daily. 07/21/19  Yes Ria Bush, MD  prednisoLONE acetate (PRED FORTE) 1 % ophthalmic suspension Place 1 drop into the left eye daily.    Yes [provider]  rOPINIRole (REQUIP) 1 MG tablet TAKE 2 TABLETS BY MOUTH AT BEDTIME Patient taking differently: Take 2 mg by mouth at bedtime.  06/14/19  Yes Ria Bush, MD  sulfamethoxazole-trimethoprim (BACTRIM DS) 800-160 MG tablet Take 1 tablet by mouth 2 (two) times daily. 10/12/19  Yes [provider]  traMADol (ULTRAM) 50 MG tablet Take 50 mg by mouth daily.  12/04/17  Yes Ria Bush, MD  tiZANidine (ZANAFLEX) 2 MG tablet Take 2 mg by mouth every evening.  12/04/17   Ria Bush, MD   Allergies  Allergen Reactions  . Penicillins Rash  and Other (See Comments)    Childhood Allergy Has patient had a PCN reaction causing immediate rash, facial/tongue/throat swelling, SOB or lightheadedness with hypotension: Yes Has patient had a PCN reaction causing severe rash involving mucus membranes or skin necrosis: Unknown Has patient had a PCN reaction that required hospitalization: No Has patient had a PCN reaction occurring within the last 10 years: No If all of the above answers are "NO", then may proceed with Cephalosporin use.    Review of Systems  Unable to perform ROS: Age    Physical Exam Vitals and nursing note reviewed.     Vital Signs: BP (!) 120/50 (BP Location: Right Arm)   Pulse 75   Temp 98.7 F (37.1 C) (Oral)   Resp 20   Ht '5\' 2"'  (1.575 m)   Wt 64.4 kg   SpO2 94%   BMI 25.97 kg/m  Pain Scale: 0-10 POSS *See Group Information*: 1-Acceptable,Awake and alert Pain Score: 10-Worst pain ever   SpO2: SpO2: 94 % O2 Device:SpO2: 94 % O2 Flow Rate: .O2 Flow Rate (L/min): 4 L/min  IO: Intake/output summary:   Intake/Output Summary (Last 24 hours) at 10/25/2019 1459 Last data filed at 10/25/2019 1105 Gross per 24 hour  Intake 240  ml  Output 1200 ml  Net -960 ml    LBM: Last BM Date: (P) 10/22/19 Baseline Weight: Weight: 64.4 kg Most recent weight: Weight: 64.4 kg     Palliative Assessment/Data:   Flowsheet Rows     Most Recent Value  Intake Tab  Referral Department Hospitalist  Unit at Time of Referral Med/Surg Unit  Palliative Care Primary Diagnosis Trauma  Date Notified 10/23/19  Palliative Care Type New Palliative care  Reason for referral Clarify Goals of Care  Date of Admission 10/23/19  Date first seen by Palliative Care 10/25/19  # of days Palliative referral response time 2 Day(s)  # of days IP prior to Palliative referral 0  Clinical Assessment  Palliative Performance Scale Score 40%  Pain Max last 24 hours Not able to report  Pain Min Last 24 hours Not able to report  Dyspnea  Max Last 24 Hours Not able to report  Dyspnea Min Last 24 hours Not able to report  Psychosocial & Spiritual Assessment  Palliative Care Outcomes      Time In: 1400 Time Out: 1510 Time Total: 70 minutes Greater than 50%  of this time was spent counseling and coordinating care related to the above assessment and plan.  Signed by: Drue Novel, NP   Please contact Palliative Medicine Team phone at [phone] for questions and concerns.  For individual provider: See Shea Evans

## 2019-10-25 NOTE — Plan of Care (Signed)
  Problem: Education: Goal: Knowledge of General Education information will improve Description Including pain rating scale, medication(s)/side effects and non-pharmacologic comfort measures Outcome: Progressing   

## 2019-10-25 NOTE — Consult Note (Addendum)
[facility] Nurse Consult Note: Reason for Consult: Consult requested for right foot.  Inner and outer ankle areas are  are dark purple and cold to the touch but there are no open wounds noted. Right outer foot with chronic full thickness wound; 1X.5cm, 100% yellow slough; no odor, drainage, or fluctuance.  Pt states she is followed by the outpatient wound care center and they have ordered Santyl. We will continue present plan of care.  Dressing procedure/placement/frequency: Topical treatment orders provided for bedside nurses to perform daily as follows to provide enzymatic debridement of nonviable tissue: Apply Santyl to right outer foot wound Q day, then cover with moist 2X2 and foam dressing.  (Change foam dressing Q 3 days or PRN soiling.) Float heels to reduce pressure. Pt should resume follow-up at the outpatient wound care center after discharge.  Please re-consult if further assistance is needed.  Thank-you,  Julien Girt MSN, [place], [place], [place], [place]

## 2019-10-25 NOTE — NC FL2 (Signed)
[place] LEVEL OF CARE SCREENING TOOL     IDENTIFICATION  Patient Name: [name] Birthdate: [birth year] Sex: female Admission Date (Current Location): 10/23/2019  [place] and [place] Number:  Engineering geologist and Address:  [facility], [address], [place], [place] [address]      Provider Number: [id number]  Attending Physician Name and Address:  Sharen Hones, MD  Relative Name and Phone Number:  PAtricia Daughter [phone]    Current Level of Care: Hospital Recommended Level of Care: [place] Prior Approval Number:    Date Approved/Denied:   PASRR Number: [id number]  Discharge Plan: SNF    Current Diagnoses: Patient Active Problem List   Diagnosis Date Noted  . Acute hypoxemic respiratory failure ([place]) 10/25/2019  . Acute on chronic diastolic CHF (congestive heart failure) ([place]) 10/25/2019  . Acute renal failure superimposed on stage 3a chronic kidney disease ([place]) 10/24/2019  . Left wrist fracture 10/24/2019  . Ulcer of right foot ([place]) 08/14/2019  . CAD (coronary artery disease), native coronary artery 07/13/2019  . Dyspnea on exertion   . Pulmonary hypertension, unspecified ([place])   . Urge incontinence 05/06/2019  . Skin lesion on examination 05/06/2019  . PAD (peripheral artery disease) ([place]) 03/12/2019  . Bilateral foot pain 01/28/2019  . Hypertrophic toenail 01/27/2019  . Cyanosis 01/12/2019  . Hemorrhoid 11/25/2018  . Vitamin B12 deficiency 08/04/2018  . Bilateral leg and foot pain 07/31/2018  . Vitamin D deficiency 02/25/2018  . Peripheral neuropathy 12/04/2017  . General unsteadiness 10/01/2017  . Constipation 10/01/2017  . Esophageal dysphagia 04/08/2017  . Chronic radicular pain of lower back 04/08/2017  . Fall 03/04/2017  . Encounter for chronic pain management 11/11/2016  . Breast mass, right 09/29/2016  . Abdominal aortic atherosclerosis ([place]) 09/07/2015  . Insomnia  06/08/2015  . Health maintenance examination 12/08/2014  . Advanced care planning/counseling discussion 12/08/2014  . Chronic leg pain 10/13/2014  . Persistent atrial fibrillation ([place]) 04/24/2013  . Medicare annual wellness visit, subsequent 04/14/2012  . Chest tightness 03/16/2012  . RLS (restless legs syndrome) 02/07/2012  . DDD (degenerative disc disease), lumbar 12/18/2009  . MDD (major depressive disorder), recurrent episode ([place]) 12/10/2007  . POSTHERPETIC NEURALGIA 04/20/2007  . HLD (hyperlipidemia) 11/24/2006  . PLMD (periodic limb movement disorder) 11/24/2006  . Essential hypertension 11/24/2006  . GERD 11/24/2006  . GASTRIC ULCER 11/24/2006  . Osteoarthritis 11/24/2006  . Osteoporosis 11/24/2006    Orientation RESPIRATION BLADDER Height & Weight     Self, Time, Situation, Place  O2 (2 liters) Continent Weight: 64.4 kg Height:  5\' 2"  (157.5 cm)  BEHAVIORAL SYMPTOMS/MOOD NEUROLOGICAL BOWEL NUTRITION STATUS      Continent Diet (regular diet)  AMBULATORY STATUS COMMUNICATION OF NEEDS Skin   Extensive Assist Verbally Surgical wounds                       Personal Care Assistance Level of Assistance  Bathing, Dressing Bathing Assistance: Limited assistance   Dressing Assistance: Limited assistance     Functional Limitations Info             SPECIAL CARE FACTORS FREQUENCY  PT (By licensed PT), OT (By licensed OT)     PT Frequency: 5 times per week OT Frequency: 5 times per week            Contractures Contractures Info: Not present    Additional Factors Info  Code Status Code Status Info: DNR  Current Medications (10/25/2019):  This is the current hospital active medication list Current Facility-Administered Medications  Medication Dose Route Frequency Provider Last Rate Last Admin  . acetaminophen (TYLENOL) tablet 1,000 mg  1,000 mg Oral Q8H PRN Hosie Poisson, MD   1,000 mg at 10/24/19 1620  . amiodarone (PACERONE) tablet 100  mg  100 mg Oral Daily Hosie Poisson, MD   100 mg at 10/25/19 0900  . apixaban (ELIQUIS) tablet 2.5 mg  2.5 mg Oral BID Hosie Poisson, MD   2.5 mg at 10/25/19 0900  . aspirin EC tablet 81 mg  81 mg Oral Daily Hosie Poisson, MD   81 mg at 10/25/19 0859  . atorvastatin (LIPITOR) tablet 20 mg  20 mg Oral Daily Hosie Poisson, MD   20 mg at 10/25/19 0900  . bisacodyl (DULCOLAX) EC tablet 5 mg  5 mg Oral Daily PRN Hosie Poisson, MD      . Chlorhexidine Gluconate Cloth 2 % PADS 6 each  6 each Topical Daily Sharen Hones, MD      . collagenase (SANTYL) ointment   Topical Daily Sharen Hones, MD      . DULoxetine (CYMBALTA) DR capsule 60 mg  60 mg Oral Daily Hosie Poisson, MD   60 mg at 10/25/19 0859  . gabapentin (NEURONTIN) capsule 300 mg  300 mg Oral QHS Hosie Poisson, MD   300 mg at 10/24/19 2203  . ondansetron (ZOFRAN) tablet 4 mg  4 mg Oral Q6H PRN Hosie Poisson, MD       Or  . ondansetron (ZOFRAN) injection 4 mg  4 mg Intravenous Q6H PRN Hosie Poisson, MD      . oxyCODONE (Oxy IR/ROXICODONE) immediate release tablet 5 mg  5 mg Oral Q4H PRN Hosie Poisson, MD   5 mg at 10/25/19 0900  . pantoprazole (PROTONIX) EC tablet 40 mg  40 mg Oral Daily Hosie Poisson, MD   40 mg at 10/25/19 0900  . polyethylene glycol (MIRALAX / GLYCOLAX) packet 17 g  17 g Oral Daily PRN Hosie Poisson, MD      . rOPINIRole (REQUIP) tablet 2 mg  2 mg Oral QHS Hosie Poisson, MD   2 mg at 10/24/19 2203  . sulfamethoxazole-trimethoprim (BACTRIM DS) 800-160 MG per tablet 1 tablet  1 tablet Oral Q12H Sharen Hones, MD   1 tablet at 10/25/19 0900  . tamsulosin (FLOMAX) capsule 0.4 mg  0.4 mg Oral QPC breakfast Sharen Hones, MD   0.4 mg at 10/25/19 0955  . tiZANidine (ZANAFLEX) tablet 2 mg  2 mg Oral QPM Hosie Poisson, MD   2 mg at 10/24/19 1815  . traMADol (ULTRAM) tablet 50 mg  50 mg Oral Q12H PRN Hosie Poisson, MD   50 mg at 10/24/19 1659     Discharge Medications: Please see discharge summary for a list of discharge  medications.  Relevant Imaging Results:  Relevant Lab Results:   Additional Information SS# [id number]  Su Hilt, RN

## 2019-10-26 DIAGNOSIS — I5033 Acute on chronic diastolic (congestive) heart failure: Secondary | ICD-10-CM

## 2019-10-26 DIAGNOSIS — S62102A Fracture of unspecified carpal bone, left wrist, initial encounter for closed fracture: Secondary | ICD-10-CM | POA: Diagnosis not present

## 2019-10-26 DIAGNOSIS — J9601 Acute respiratory failure with hypoxia: Secondary | ICD-10-CM | POA: Diagnosis not present

## 2019-10-26 DIAGNOSIS — N178 Other acute kidney failure: Secondary | ICD-10-CM | POA: Diagnosis not present

## 2019-10-26 LAB — CBC WITH DIFFERENTIAL/PLATELET
Abs Immature Granulocytes: 0.03 10*3/uL (ref 0.00–0.07)
Basophils Absolute: 0.1 10*3/uL (ref 0.0–0.1)
Basophils Relative: 1 %
Eosinophils Absolute: 0.2 10*3/uL (ref 0.0–0.5)
Eosinophils Relative: 3 %
HCT: 30.9 % — ABNORMAL LOW (ref 36.0–46.0)
Hemoglobin: 10.4 g/dL — ABNORMAL LOW (ref 12.0–15.0)
Immature Granulocytes: 1 %
Lymphocytes Relative: 14 %
Lymphs Abs: 0.9 10*3/uL (ref 0.7–4.0)
MCH: 33.2 pg (ref 26.0–34.0)
MCHC: 33.7 g/dL (ref 30.0–36.0)
MCV: 98.7 fL (ref 80.0–100.0)
Monocytes Absolute: 0.7 10*3/uL (ref 0.1–1.0)
Monocytes Relative: 11 %
Neutro Abs: 4.6 10*3/uL (ref 1.7–7.7)
Neutrophils Relative %: 70 %
Platelets: 191 10*3/uL (ref 150–400)
RBC: 3.13 MIL/uL — ABNORMAL LOW (ref 3.87–5.11)
RDW: 13.8 % (ref 11.5–15.5)
WBC: 6.5 10*3/uL (ref 4.0–10.5)
nRBC: 0 % (ref 0.0–0.2)

## 2019-10-26 LAB — BASIC METABOLIC PANEL
Anion gap: 9 (ref 5–15)
BUN: 29 mg/dL — ABNORMAL HIGH (ref 8–23)
CO2: 24 mmol/L (ref 22–32)
Calcium: 8.2 mg/dL — ABNORMAL LOW (ref 8.9–10.3)
Chloride: 100 mmol/L (ref 98–111)
Creatinine, Ser: 1.62 mg/dL — ABNORMAL HIGH (ref 0.44–1.00)
GFR calc Af Amer: 33 mL/min — ABNORMAL LOW (ref >60)
GFR calc non Af Amer: 28 mL/min — ABNORMAL LOW (ref >60)
Glucose, Bld: 115 mg/dL — ABNORMAL HIGH (ref 70–99)
Potassium: 4.7 mmol/L (ref 3.5–5.1)
Sodium: 133 mmol/L — ABNORMAL LOW (ref 135–145)

## 2019-10-26 LAB — MAGNESIUM: Magnesium: 2.1 mg/dL (ref 1.7–2.4)

## 2019-10-26 MED ORDER — FUROSEMIDE 40 MG PO TABS
40.0000 mg | ORAL_TABLET | Freq: Once | ORAL | Status: AC
  Administered 2019-10-26: 40 mg via ORAL
  Filled 2019-10-26: qty 1

## 2019-10-26 NOTE — Telephone Encounter (Addendum)
Spoke with pt's daughter, [name] (on dpr), notifying her pt's DNR is ready to pick up.  Expresses her thanks to Dr. Concha Pyo DNR at front office- yellow folders.  Made copy to be scanned.]

## 2019-10-26 NOTE — Progress Notes (Signed)
PT Cancellation Note  Patient Details Name: [name] MRN: [id number] DOB: [birth year]   Cancelled Treatment:     PT attempt. Pt refused 2/2 to pain. Report 9/10 pain and is requesting pain meds prior to PT session. RN notified. Therapist will return at later time when pt is more appropriate to participate.    Willette Pa 10/26/2019, 3:04 PM

## 2019-10-26 NOTE — Care Management Important Message (Signed)
Important Message  Patient Details  Name: [name] MRN: [id number] Date of Birth: 1929/06/06   Medicare Important Message Given:  Yes     Dannette Barbara 10/26/2019, 11:00 AM

## 2019-10-26 NOTE — Progress Notes (Signed)
Physical Therapy Treatment Patient Details Name: [name] MRN: [id number] DOB: 01/02/1930 Today's Date: 10/26/2019    History of Present Illness  [name] is a 86 y.o. female with medical history significant of, GERD, essential hypertension vitamin B12 deficiency, peripheral vascular disease, peripheral neuropathy, s/p angioplasty to right peroneal and right posterior tibial arteries, osteoporosis with history of compression fractures T5 and L1, chronic ulcer on the right foot follows up with podiatry and wound care, Hyperlipidemia, persistent atrial fibrillation , RLS, CAD, s/p recent left and right heart cath, presents to ED after a fall. It was unwitnessed. Pt is hard of hearing and history is limited. As per the daughter at bedside, she has a h/o recurrent falls, unsteady on her feet, fell twice in the last week, was walking with her walker at home, had a mechanical fall. X rays of the wrist show Comminuted, impacted fracture of the distal radial metaphysis and epiphysis with extension into the radiocarpal joint. Nondisplaced fracture at the base of the ulnar styloid. Dorsal angulation of the distal fragments of the distal radius fracture without significant displacement. There is also a possible dorsal carpal avulsion fracture.    PT Comments    Pt was long sitting in bed with LUE elevated on pillows. Pt received pain meds ~ 30 minutes prior. She is now lethargic but was willing to participate. Pt was able to sit up on L side of bed with vcs for technique and adhering to NWB LUE. Stood 4 x EOB with RUE HHA +1 with mod assist. Was able to take 3 steps from FOB to [facility] with max assist. Pt struggles with progressing/advancing LLE. Overall pt tolerated session well but was limited by fatigue and lethargy.  She will benefit from SNF at [place] to address strength, balance, and safe functional mobility deficits. Pt was repositioned in bed with call bell in reach, LUE elevated on pillows, call bell  in reach, and bed alarm set.     Follow Up Recommendations  SNF     Equipment Recommendations  Other (comment) (defer to next level of care)    Recommendations for Other Services       Precautions / Restrictions Precautions Precautions: Fall Restrictions Weight Bearing Restrictions: Yes LUE Weight Bearing: Non weight bearing    Mobility  Bed Mobility Overal bed mobility: Needs Assistance Bed Mobility: Supine to Sit;Sit to Supine     Supine to sit: Mod assist Sit to supine: Mod assist   General bed mobility comments: mod assist + increased time and max vcs for technique. she was able to exit L side of bed. constant reminders of NWB LUE. placed LUE in sling to assist pt with remembering NWB  Transfers Overall transfer level: Needs assistance Equipment used: 1 person hand held assist (RUE HHA only) Transfers: Sit to/from Stand Sit to Stand: Mod assist         General transfer comment: pt performed STS ~ 4 x during session. Vcs for improved fwd wt shift and with eccentric control with stand to sit. Pt able to tolerate standing ~ 30 sec each trial.   Ambulation/Gait Ambulation/Gait assistance: Max assist Gait Distance (Feet): 3 Feet Assistive device: 1 person hand held assist Gait Pattern/deviations: Step-to pattern Gait velocity: decreased   General Gait Details: Pt was able to take 3 steps from FOB to [facility] with max assist. +1 RUE HHA + gait belt. pt was able to clear BLEs however incraesed difficulty clearing LLE than RLE. pt reports L foot  pain with taking steps.    Stairs             Wheelchair Mobility    Modified Rankin (Stroke Patients Only)       Balance Overall balance assessment: Needs assistance Sitting-balance support: Single extremity supported;Feet supported Sitting balance-Leahy Scale: Fair Sitting balance - Comments: CGA with occasional supervison only. Therapist questions if sitting baalnce is impacted from medication/lethargy    Standing balance support: Single extremity supported;During functional activity Standing balance-Leahy Scale: Poor Standing balance comment: pt has poor standing balance with RUE HHA only. Constant assistance required to prevent LOB/fall.                            Cognition Arousal/Alertness: Suspect due to medications;Lethargic Behavior During Therapy: WFL for tasks assessed/performed Overall Cognitive Status: No family/caregiver present to determine baseline cognitive functioning                         Following Commands: Follows one step commands consistently Safety/Judgement: Decreased awareness of safety;Decreased awareness of deficits   Problem Solving: Slow processing;Difficulty sequencing;Decreased initiation;Requires verbal cues;Requires tactile cues General Comments: Pt is much more lethargic after medication given ~ 40 minutes prior. She is able to follow commands but cognition difficult to assess      Exercises      General Comments        Pertinent Vitals/Pain Pain Assessment: 0-10 Pain Score: 6  Faces Pain Scale: Hurts little more Pain Location: L wrist Pain Descriptors / Indicators: Grimacing;Discomfort Pain Intervention(s): Monitored during session;Limited activity within patient's tolerance;Premedicated before session;Repositioned    Home Living                      Prior Function            PT Goals (current goals can now be found in the care plan section) Acute Rehab PT Goals Patient Stated Goal: To return home  Progress towards PT goals: Progressing toward goals    Frequency    Min 2X/week      PT Plan Current plan remains appropriate    Co-evaluation              AM-PAC PT "6 Clicks" Mobility   Outcome Measure  Help needed turning from your back to your side while in a flat bed without using bedrails?: A Lot Help needed moving from lying on your back to sitting on the side of a flat bed without  using bedrails?: A Lot Help needed moving to and from a bed to a chair (including a wheelchair)?: A Lot Help needed standing up from a chair using your arms (e.g., wheelchair or bedside chair)?: A Lot Help needed to walk in hospital room?: A Lot Help needed climbing 3-5 steps with a railing? : A Lot 6 Click Score: 12    End of Session Equipment Utilized During Treatment: Gait belt Activity Tolerance: Patient limited by lethargy;Patient limited by fatigue Patient left: in bed;with call bell/phone within reach;with bed alarm set Nurse Communication: Mobility status PT Visit Diagnosis: Unsteadiness on feet (R26.81);Other abnormalities of gait and mobility (R26.89);Repeated falls (R29.6);Difficulty in walking, not elsewhere classified (R26.2);History of falling (Z91.81);Muscle weakness (generalized) (M62.81);Pain Pain - Right/Left: Left Pain - part of body: Arm     Time: 1610-9604 PT Time Calculation (min) (ACUTE ONLY): 20 min  Charges:  $Therapeutic Activity: 8-22 mins  Julaine Fusi PTA 10/26/19, 4:34 PM

## 2019-10-26 NOTE — Progress Notes (Signed)
PROGRESS NOTE    [name]  HDQ:[id number] DOB: 1930-01-13 DOA: 10/23/2019 PCP: Ria Bush, MD \  Chief complaint.  Left wrist pain.  Brief Narrative: [name] a 86 y.o.femalewith medical history significant of, GERD, essential hypertension vitamin B12 deficiency, peripheral vascular disease, peripheral neuropathy, s/p angioplasty to right peroneal and right posterior tibial arteries, osteoporosis with history of compression fractures T5 and L1, chronic ulcer on the right foot follows up with podiatry and wound care, Hyperlipidemia, persistent atrial fibrillation , RLS, CAD, s/p recent left and right heart cath, presents to ED after a fall. Apparently, patient had a frequent falls with the previous thoracic and lumbar fractures. Upon arriving the emergency room, she was a found to have left the risk of fracture, it was splinted in the emergency room. She was also found to have acute renal failure with a creatinine of 2.12. Her previous creatinine was 1.09 in April. She is started on symptomatic treatment, IV fluids.  7/18.Patient still have pain in the left wrist. Her bladder appeared full. Obtain bladder scan. Continue IV fluids recheck BMP tomorrow. Obtain physical therapy/Occupational Therapy. 7/19.  Bladder scan is performed in my presence today, showed residual 404 mL.  Foley catheter is anchored.  Patient also had increased oxygen requirement overnight, given 40 mg IV Lasix.  Called orthopedics consult as patient left risk is not properly splinted.  7/20.  Condition improved today.  Renal function better, still not at baseline.  Still on 2 L oxygen.  Will give 40 mg oral Lasix.    Assessment & Plan:   Active Problems:   Essential hypertension   Fall   Acute renal failure superimposed on stage 3a chronic kidney disease (HCC)   Left wrist fracture   Acute hypoxemic respiratory failure (HCC)   Acute on chronic diastolic CHF (congestive heart failure)  (HCC)   At risk for inadequate pain control   Closed fracture of right wrist   Goals of care, counseling/discussion   Palliative care by specialist  #1.  Acute kidney injury on chronic kidney disease stage IIIa.  Secondary to urinary retention and dehydration. Renal function improved, but is still not at baseline.  Continue to follow for another day.  2.  Acute on chronic diastolic congestive heart failure with acute hypoxemic respiratory failure. Condition improving after IV Lasix.  Patient still has a mild volume overload, give another dose of oral Lasix.  Patient should be weaned off oxygen after today's Lasix.  3.  Urinary retention. No evidence of UTI.  Start Flomax.  May attempt to remove Foley catheter tomorrow before discharge.  4.  Persistent atrial fibrillation. Continue anticoagulation.  5.  Mechanical fall with left wrist fracture. Left wrist is properly splinted by orthopedics.  Pain is better.  6.  Left foot chronic wound with infection. Still Bactrim prescribed by outpatient.  DVT prophylaxis:Eliquis Code Status:full Family Communication: Had a long discussion with patient daughter in patient room, all questions answered. Disposition Plan:  Patient came from:Home  Anticipated d/c place:snf?  Barriers to d/c OR conditions which need to be met to effect a safe d/c:   Consultants:  None  Procedures:None Antimicrobials: Bactrim.   Subjective: Patient feels better today.  Left wrist pain is better.  Still on 2 L oxygen, no significant short of breath today.  No cough.  No diarrhea constipation.  Objective: Vitals:   10/25/19 1359 10/25/19 1550 10/25/19 2251 10/26/19 0731  BP: (!) 120/50 (!) 117/52 127/61 105/60  Pulse: 75 79  85 74  Resp: '20 16 17 16  ' Temp: 98.7 F (37.1 C) 98.3 F (36.8 C) 98.4 F (36.9 C) 98.1 F (36.7  C)  TempSrc: Oral Oral Oral Oral  SpO2: 94% 96% 96% 96%  Weight:      Height:        Intake/Output Summary (Last 24 hours) at 10/26/2019 0943 Last data filed at 10/25/2019 1550 Gross per 24 hour  Intake 240 ml  Output 1300 ml  Net -1060 ml   Filed Weights   10/23/19 1158  Weight: 64.4 kg    Examination:  General exam: Appears calm and comfortable  Respiratory system: Mild fine crackles in the base bilaterally. Respiratory effort normal. Cardiovascular system: Irregular.  No JVD, murmurs, rubs, gallops or clicks. No pedal edema. Gastrointestinal system: Abdomen is nondistended, soft and nontender. No organomegaly or masses felt. Normal bowel sounds heard. Central nervous system: Alert and oriented. No focal neurological deficits. Extremities: Symmetric  Skin: No rashes, lesions or ulcers Psychiatry: Judgement and insight appear normal. Mood & affect appropriate.     Data Reviewed: I have personally reviewed following labs and imaging studies  CBC: Recent Labs  Lab 10/23/19 1539 10/24/19 0828 10/26/19 0428  WBC 6.5 8.3 6.5  NEUTROABS 5.0  --  4.6  HGB 11.4* 11.8* 10.4*  HCT 34.4* 36.0 30.9*  MCV 98.9 99.4 98.7  PLT 235 221 790   Basic Metabolic Panel: Recent Labs  Lab 10/23/19 1539 10/24/19 0828 10/25/19 0258 10/26/19 0428  NA 134* 137 135 133*  K 4.6 4.8 5.0 4.7  CL 98 100 106 100  CO2 '28 25 25 24  ' GLUCOSE 102* 108* 102* 115*  BUN 30* 35* 36* 29*  CREATININE 2.12* 2.47* 2.08* 1.62*  CALCIUM 8.7* 8.5* 8.3* 8.2*  MG  --   --  2.2 2.1   GFR: Estimated Creatinine Clearance: 21.1 mL/min (A) (by C-G formula based on SCr of 1.62 mg/dL (H)). Liver Function Tests: Recent Labs  Lab 10/23/19 1539  AST 20  ALT 18  ALKPHOS 45  BILITOT 0.5  PROT 6.8  ALBUMIN 4.0   No results for input(s): LIPASE, AMYLASE in the last 168 hours. No results for input(s): AMMONIA in the last 168 hours. Coagulation Profile: No results for input(s): INR, PROTIME in the last  168 hours. Cardiac Enzymes: Recent Labs  Lab 10/24/19 0828  CKTOTAL 165   BNP (last 3 results) No results for input(s): PROBNP in the last 8760 hours. HbA1C: No results for input(s): HGBA1C in the last 72 hours. CBG: No results for input(s): GLUCAP in the last 168 hours. Lipid Profile: No results for input(s): CHOL, HDL, LDLCALC, TRIG, CHOLHDL, LDLDIRECT in the last 72 hours. Thyroid Function Tests: No results for input(s): TSH, T4TOTAL, FREET4, T3FREE, THYROIDAB in the last 72 hours. Anemia Panel: No results for input(s): VITAMINB12, FOLATE, FERRITIN, TIBC, IRON, RETICCTPCT in the last 72 hours. Sepsis Labs: No results for input(s): PROCALCITON, LATICACIDVEN in the last 168 hours.  Recent Results (from the past 240 hour(s))  SARS Coronavirus 2 by RT PCR (hospital order, performed in [facility] hospital lab) Nasopharyngeal Nasopharyngeal Swab     Status: None   Collection Time: 10/23/19  3:39 PM   Specimen: Nasopharyngeal Swab  Result Value Ref Range Status   SARS Coronavirus 2 NEGATIVE NEGATIVE Final    Comment: (NOTE) SARS-CoV-2 target nucleic acids are NOT DETECTED.  The SARS-CoV-2 RNA is generally detectable in upper and lower respiratory specimens during the acute phase of infection.  The lowest concentration of SARS-CoV-2 viral copies this assay can detect is 250 copies / mL. A negative result does not preclude SARS-CoV-2 infection and should not be used as the sole basis for treatment or other patient management decisions.  A negative result may occur with improper specimen collection / handling, submission of specimen other than nasopharyngeal swab, presence of viral mutation(s) within the areas targeted by this assay, and inadequate number of viral copies (<250 copies / mL). A negative result must be combined with clinical observations, patient history, and epidemiological information.  Fact Sheet for Patients:   StrictlyIdeas.no  Fact  Sheet for Healthcare Providers: BankingDealers.co.za  This test is not yet approved or  cleared by the [place] FDA and has been authorized for detection and/or diagnosis of SARS-CoV-2 by FDA under an Emergency Use Authorization (EUA).  This EUA will remain in effect (meaning this test can be used) for the duration of the COVID-19 declaration under Section 564(b)(1) of the Act, 21 U.S.C. section 360bbb-3(b)(1), unless the authorization is terminated or revoked sooner.  Performed at [facility], [address] [place], [place] [address]          Radiology Studies: No results found.      Scheduled Meds: . amiodarone  100 mg Oral Daily  . apixaban  2.5 mg Oral BID  . aspirin EC  81 mg Oral Daily  . atorvastatin  20 mg Oral Daily  . Chlorhexidine Gluconate Cloth  6 each Topical Daily  . collagenase   Topical Daily  . DULoxetine  60 mg Oral Daily  . gabapentin  300 mg Oral QHS  . pantoprazole  40 mg Oral Daily  . rOPINIRole  2 mg Oral QHS  . sulfamethoxazole-trimethoprim  1 tablet Oral Q12H  . tamsulosin  0.4 mg Oral QPC breakfast  . tiZANidine  2 mg Oral QPM   Continuous Infusions:   LOS: 3 days    Time spent: 34 minutes    Sharen Hones, MD [facility]   To contact the attending provider between 7A-7P or the covering provider during after hours 7P-7A, please log into the web site [email or web address] and access using universal [place] password for that web site. If you do not have the password, please call the hospital operator.  10/26/2019, 9:43 AM

## 2019-10-26 NOTE — TOC Progression Note (Signed)
Transition of Care [facility]) - Progression Note    Patient Details  Name: [name] MRN: [id number] Date of Birth: [birth year]  [facility] CM/SW Contact  Su Hilt, RN Phone Number: 10/26/2019, 9:28 AM  Clinical Narrative:    Received a notification of auth approval from [facility] 7/20 thru 7/22, Notified Claiborne Billings at [facility], the patient will go to room 27B        Expected Discharge Plan and Services                                                 Social Determinants of Health (SDOH) Interventions    Readmission Risk Interventions No flowsheet data found.

## 2019-10-26 NOTE — TOC Progression Note (Signed)
Transition of Care [facility]) - Progression Note    Patient Details  Name: [name] MRN: [id number] Date of Birth: 22-Aug-1929  Transition [facility] Care [facility]) CM/SW Contact  Su Hilt, RN Phone Number: 10/26/2019, 10:17 AM  Clinical Narrative:     Received a call from Olean Ree with [facility] requesting the facility name for the patient to go to SNF, I provided [facility] name and NPI Approved [id number] ref number         Expected Discharge Plan and Services                                                 Social Determinants of Health (SDOH) Interventions    Readmission Risk Interventions No flowsheet data found.

## 2019-10-27 ENCOUNTER — Ambulatory Visit: Payer: Medicare Other

## 2019-10-27 DIAGNOSIS — I70235 Atherosclerosis of native arteries of right leg with ulceration of other part of foot: Secondary | ICD-10-CM | POA: Diagnosis not present

## 2019-10-27 DIAGNOSIS — M5136 Other intervertebral disc degeneration, lumbar region: Secondary | ICD-10-CM | POA: Diagnosis not present

## 2019-10-27 DIAGNOSIS — S32010D Wedge compression fracture of first lumbar vertebra, subsequent encounter for fracture with routine healing: Secondary | ICD-10-CM | POA: Diagnosis not present

## 2019-10-27 DIAGNOSIS — S22050D Wedge compression fracture of T5-T6 vertebra, subsequent encounter for fracture with routine healing: Secondary | ICD-10-CM | POA: Diagnosis not present

## 2019-10-27 DIAGNOSIS — N1831 Chronic kidney disease, stage 3a: Secondary | ICD-10-CM | POA: Diagnosis not present

## 2019-10-27 DIAGNOSIS — I130 Hypertensive heart and chronic kidney disease with heart failure and stage 1 through stage 4 chronic kidney disease, or unspecified chronic kidney disease: Secondary | ICD-10-CM | POA: Diagnosis not present

## 2019-10-27 DIAGNOSIS — K219 Gastro-esophageal reflux disease without esophagitis: Secondary | ICD-10-CM | POA: Diagnosis not present

## 2019-10-27 DIAGNOSIS — S52502A Unspecified fracture of the lower end of left radius, initial encounter for closed fracture: Secondary | ICD-10-CM | POA: Diagnosis not present

## 2019-10-27 DIAGNOSIS — R1310 Dysphagia, unspecified: Secondary | ICD-10-CM | POA: Diagnosis not present

## 2019-10-27 DIAGNOSIS — I739 Peripheral vascular disease, unspecified: Secondary | ICD-10-CM | POA: Diagnosis not present

## 2019-10-27 DIAGNOSIS — R6889 Other general symptoms and signs: Secondary | ICD-10-CM | POA: Diagnosis not present

## 2019-10-27 DIAGNOSIS — J9601 Acute respiratory failure with hypoxia: Secondary | ICD-10-CM | POA: Diagnosis not present

## 2019-10-27 DIAGNOSIS — B0230 Zoster ocular disease, unspecified: Secondary | ICD-10-CM | POA: Diagnosis not present

## 2019-10-27 DIAGNOSIS — E7849 Other hyperlipidemia: Secondary | ICD-10-CM | POA: Diagnosis not present

## 2019-10-27 DIAGNOSIS — Z880 Allergy status to penicillin: Secondary | ICD-10-CM | POA: Diagnosis not present

## 2019-10-27 DIAGNOSIS — M1990 Unspecified osteoarthritis, unspecified site: Secondary | ICD-10-CM | POA: Diagnosis not present

## 2019-10-27 DIAGNOSIS — S62101S Fracture of unspecified carpal bone, right wrist, sequela: Secondary | ICD-10-CM

## 2019-10-27 DIAGNOSIS — Z7401 Bed confinement status: Secondary | ICD-10-CM | POA: Diagnosis not present

## 2019-10-27 DIAGNOSIS — I480 Paroxysmal atrial fibrillation: Secondary | ICD-10-CM | POA: Diagnosis not present

## 2019-10-27 DIAGNOSIS — G2581 Restless legs syndrome: Secondary | ICD-10-CM | POA: Diagnosis not present

## 2019-10-27 DIAGNOSIS — L97519 Non-pressure chronic ulcer of other part of right foot with unspecified severity: Secondary | ICD-10-CM | POA: Diagnosis not present

## 2019-10-27 DIAGNOSIS — R262 Difficulty in walking, not elsewhere classified: Secondary | ICD-10-CM | POA: Diagnosis not present

## 2019-10-27 DIAGNOSIS — S62102D Fracture of unspecified carpal bone, left wrist, subsequent encounter for fracture with routine healing: Secondary | ICD-10-CM | POA: Diagnosis not present

## 2019-10-27 DIAGNOSIS — M810 Age-related osteoporosis without current pathological fracture: Secondary | ICD-10-CM | POA: Diagnosis not present

## 2019-10-27 DIAGNOSIS — E538 Deficiency of other specified B group vitamins: Secondary | ICD-10-CM | POA: Diagnosis not present

## 2019-10-27 DIAGNOSIS — I4891 Unspecified atrial fibrillation: Secondary | ICD-10-CM | POA: Diagnosis not present

## 2019-10-27 DIAGNOSIS — I2510 Atherosclerotic heart disease of native coronary artery without angina pectoris: Secondary | ICD-10-CM | POA: Diagnosis not present

## 2019-10-27 DIAGNOSIS — S52602A Unspecified fracture of lower end of left ulna, initial encounter for closed fracture: Secondary | ICD-10-CM | POA: Diagnosis not present

## 2019-10-27 DIAGNOSIS — N179 Acute kidney failure, unspecified: Secondary | ICD-10-CM | POA: Diagnosis not present

## 2019-10-27 DIAGNOSIS — N178 Other acute kidney failure: Secondary | ICD-10-CM | POA: Diagnosis not present

## 2019-10-27 DIAGNOSIS — R2681 Unsteadiness on feet: Secondary | ICD-10-CM | POA: Diagnosis not present

## 2019-10-27 DIAGNOSIS — L03115 Cellulitis of right lower limb: Secondary | ICD-10-CM | POA: Diagnosis not present

## 2019-10-27 DIAGNOSIS — Z9181 History of falling: Secondary | ICD-10-CM | POA: Diagnosis not present

## 2019-10-27 DIAGNOSIS — M25532 Pain in left wrist: Secondary | ICD-10-CM | POA: Diagnosis not present

## 2019-10-27 DIAGNOSIS — R278 Other lack of coordination: Secondary | ICD-10-CM | POA: Diagnosis not present

## 2019-10-27 DIAGNOSIS — L97512 Non-pressure chronic ulcer of other part of right foot with fat layer exposed: Secondary | ICD-10-CM | POA: Diagnosis not present

## 2019-10-27 DIAGNOSIS — M6281 Muscle weakness (generalized): Secondary | ICD-10-CM | POA: Diagnosis not present

## 2019-10-27 DIAGNOSIS — I5033 Acute on chronic diastolic (congestive) heart failure: Secondary | ICD-10-CM | POA: Diagnosis not present

## 2019-10-27 DIAGNOSIS — Z9189 Other specified personal risk factors, not elsewhere classified: Secondary | ICD-10-CM | POA: Diagnosis not present

## 2019-10-27 DIAGNOSIS — R600 Localized edema: Secondary | ICD-10-CM | POA: Diagnosis not present

## 2019-10-27 DIAGNOSIS — I10 Essential (primary) hypertension: Secondary | ICD-10-CM | POA: Diagnosis not present

## 2019-10-27 DIAGNOSIS — R0600 Dyspnea, unspecified: Secondary | ICD-10-CM | POA: Diagnosis not present

## 2019-10-27 DIAGNOSIS — M2550 Pain in unspecified joint: Secondary | ICD-10-CM | POA: Diagnosis not present

## 2019-10-27 DIAGNOSIS — E559 Vitamin D deficiency, unspecified: Secondary | ICD-10-CM | POA: Diagnosis not present

## 2019-10-27 DIAGNOSIS — I272 Pulmonary hypertension, unspecified: Secondary | ICD-10-CM | POA: Diagnosis not present

## 2019-10-27 DIAGNOSIS — Z743 Need for continuous supervision: Secondary | ICD-10-CM | POA: Diagnosis not present

## 2019-10-27 DIAGNOSIS — Z7901 Long term (current) use of anticoagulants: Secondary | ICD-10-CM | POA: Diagnosis not present

## 2019-10-27 DIAGNOSIS — G9009 Other idiopathic peripheral autonomic neuropathy: Secondary | ICD-10-CM | POA: Diagnosis not present

## 2019-10-27 LAB — BASIC METABOLIC PANEL
Anion gap: 9 (ref 5–15)
BUN: 27 mg/dL — ABNORMAL HIGH (ref 8–23)
CO2: 26 mmol/L (ref 22–32)
Calcium: 8.5 mg/dL — ABNORMAL LOW (ref 8.9–10.3)
Chloride: 98 mmol/L (ref 98–111)
Creatinine, Ser: 1.29 mg/dL — ABNORMAL HIGH (ref 0.44–1.00)
GFR calc Af Amer: 43 mL/min — ABNORMAL LOW (ref >60)
GFR calc non Af Amer: 37 mL/min — ABNORMAL LOW (ref >60)
Glucose, Bld: 106 mg/dL — ABNORMAL HIGH (ref 70–99)
Potassium: 4.4 mmol/L (ref 3.5–5.1)
Sodium: 133 mmol/L — ABNORMAL LOW (ref 135–145)

## 2019-10-27 LAB — CBC WITH DIFFERENTIAL/PLATELET
Abs Immature Granulocytes: 0.03 10*3/uL (ref 0.00–0.07)
Basophils Absolute: 0 10*3/uL (ref 0.0–0.1)
Basophils Relative: 1 %
Eosinophils Absolute: 0.2 10*3/uL (ref 0.0–0.5)
Eosinophils Relative: 3 %
HCT: 30.2 % — ABNORMAL LOW (ref 36.0–46.0)
Hemoglobin: 10.5 g/dL — ABNORMAL LOW (ref 12.0–15.0)
Immature Granulocytes: 1 %
Lymphocytes Relative: 10 %
Lymphs Abs: 0.6 10*3/uL — ABNORMAL LOW (ref 0.7–4.0)
MCH: 33.8 pg (ref 26.0–34.0)
MCHC: 34.8 g/dL (ref 30.0–36.0)
MCV: 97.1 fL (ref 80.0–100.0)
Monocytes Absolute: 0.5 10*3/uL (ref 0.1–1.0)
Monocytes Relative: 9 %
Neutro Abs: 4.4 10*3/uL (ref 1.7–7.7)
Neutrophils Relative %: 76 %
Platelets: 192 10*3/uL (ref 150–400)
RBC: 3.11 MIL/uL — ABNORMAL LOW (ref 3.87–5.11)
RDW: 13.6 % (ref 11.5–15.5)
WBC: 5.8 10*3/uL (ref 4.0–10.5)
nRBC: 0 % (ref 0.0–0.2)

## 2019-10-27 LAB — MAGNESIUM: Magnesium: 2.1 mg/dL (ref 1.7–2.4)

## 2019-10-27 MED ORDER — SULFAMETHOXAZOLE-TRIMETHOPRIM 400-80 MG PO TABS: 1.0000 | ORAL_TABLET | Freq: Two times a day (BID) | ORAL | 0 refills | Status: AC

## 2019-10-27 MED ORDER — POLYETHYLENE GLYCOL 3350 17 G PO PACK: 17.0000 g | PACK | Freq: Every day | ORAL | 0 refills | Status: DC | PRN

## 2019-10-27 MED ORDER — TAMSULOSIN HCL 0.4 MG PO CAPS: 0.4000 mg | ORAL_CAPSULE | Freq: Every day | ORAL | Status: DC

## 2019-10-27 MED ORDER — FLEET ENEMA 7-19 GM/118ML RE ENEM
1.0000 | ENEMA | Freq: Once | RECTAL | Status: AC
  Administered 2019-10-27: 1 via RECTAL

## 2019-10-27 MED ORDER — AMIODARONE HCL 100 MG PO TABS: 100.0000 mg | ORAL_TABLET | Freq: Every day | ORAL | Status: DC

## 2019-10-27 NOTE — Discharge Summary (Signed)
[name] IEP:[id number] DOB: Mar 25, 1930 DOA: 10/23/2019  PCP: Ria Bush, MD  Admit date: 10/23/2019 Discharge date: 10/27/2019  Admitted From: Home Disposition:  health  Recommendations for Outpatient Follow-up:  1. Follow up with PCP in 1 week 2. Please obtain BMP/CBC in one week 3. Follow-up with orthopedics Dr. Roland Rack in 1 week for repeat reevaluation and x-ray     Discharge Condition:Stable CODE STATUS: DNR Diet recommendation: Heart Healthy   Brief/Interim Summary: [name] a 86 y.o.femalewith medical history significant of, GERD, essential hypertension vitamin B12 deficiency, peripheral vascular disease, peripheral neuropathy, s/p angioplasty to right peroneal and right posterior tibial arteries, osteoporosis with history of compression fractures T5 and L1, chronic ulcer on the right foot follows up with podiatry and wound care, Hyperlipidemia, persistent atrial fibrillation , RLS, CAD, s/p recent left and right heart cath, presents to ED after a fall. Apparently, patient had a frequent falls with the previous thoracic and lumbar fractures. Upon arriving the emergency room, she was a found to have left the risk of fracture, it was splinted in the emergency room. She was also found to have acute renal failure with a creatinine of 2.12. Her previous creatinine was 1.09 in April.  She also had bladder fullness and bladder scan revealed postvoid old residual.  Foley was placed.  She had also required increased oxygen and Lasix was given to her with improvement of her breathing. Orthopedics was consulted they advised to keep the splint on and intact and to keep the hand elevated as much as possible.  She may be mobilized with a platform walker on the left side with physical therapy.  Daughter updated today   #1.  Acute kidney injury on chronic kidney disease stage IIIa.  Secondary to urinary retention and dehydration. Improved.  Creatinine today  1.29 Should have repeat bmp within one week  2.  Acute on chronic diastolic congestive heart failure with acute hypoxemic respiratory failure. Improved with diuresis. On 02 1-2 L [place],off o2 89% but when takes deep breaths improves to 90's. Needs to use incentive spirometer 10x per hour 4x/day Wean off o2 e  3.  Urinary retention. No evidence of UTI.  Started on  Flomax.   Foley removed today, bladder scan no urine.  Please monitor `urine output for next day, if need to replace foley with f/u with pcp and voiding trial    4.  Persistent atrial fibrillation. Continue anticoagulation. Metoprolol on hold here as bp on low side. When bp stable can start toprol xl 12.5 mg qd  5.  Mechanical fall with left wrist fracture. Left wrist is properly splinted by orthopedics.  Pain is better. F/u with ortho in one week  6.  Left foot chronic wound with infection. Still Bactrim prescribed by outpatient, continue and discuss with pcp.   Discharge Diagnoses:  Active Problems:   Essential hypertension   Fall   Acute renal failure superimposed on stage 3a chronic kidney disease (HCC)   Left wrist fracture   Acute hypoxemic respiratory failure (HCC)   Acute on chronic diastolic CHF (congestive heart failure) (HCC)   At risk for inadequate pain control   Closed fracture of right wrist   Goals of care, counseling/discussion   Palliative care by specialist    Discharge Instructions  Discharge Instructions     Remove dressing in 72 hours   Complete by: As directed    Call MD for:  temperature >100.4   Complete by: As directed  Diet - low sodium heart healthy   Complete by: As directed    For home use only DME standard manual wheelchair with seat cushion   Complete by: As directed    Patient suffers from a fractured wrist which impairs their ability to perform daily activities like bathing, dressing and grooming in the home.  A walker will not resolve issue with performing  activities of daily living. A wheelchair will allow patient to safely perform daily activities. Patient can safely propel the wheelchair in the home or has a caregiver who can provide assistance. Length of need Lifetime. Accessories: elevating leg rests (ELRs), wheel locks, extensions and anti-tippers.   Increase activity slowly   Complete by: As directed      Allergies as of 10/27/2019      Reactions   Penicillins Rash, Other (See Comments)   Childhood Allergy Has patient had a PCN reaction causing immediate rash, facial/tongue/throat swelling, SOB or lightheadedness with hypotension: Yes Has patient had a PCN reaction causing severe rash involving mucus membranes or skin necrosis: Unknown Has patient had a PCN reaction that required hospitalization: No Has patient had a PCN reaction occurring within the last 10 years: No If all of the above answers are "NO", then may proceed with Cephalosporin use.      Medication List    STOP taking these medications   furosemide 40 MG tablet Commonly known as: LASIX   lisinopril 10 MG tablet Commonly known as: Zestril   metoprolol tartrate 25 MG tablet Commonly known as: LOPRESSOR   sulfamethoxazole-trimethoprim 800-160 MG tablet Commonly known as: BACTRIM DS Replaced by: sulfamethoxazole-trimethoprim 400-80 MG tablet     TAKE these medications   acetaminophen 500 MG tablet Commonly known as: TYLENOL Take 1,000 mg every 8 (eight) hours as needed by mouth for moderate pain.   acyclovir 400 MG tablet Commonly known as: ZOVIRAX Take 400 mg 2 (two) times daily by mouth.   amiodarone 100 MG tablet Commonly known as: PACERONE Take 1 tablet (100 mg total) by mouth daily. What changed:   when to take this  Another medication with the same name was removed. Continue taking this medication, and follow the directions you see here.   aspirin EC 81 MG tablet Take 1 tablet (81 mg total) by mouth daily.   atorvastatin 20 MG tablet Commonly  known as: LIPITOR TAKE 1 TABLET BY MOUTH ONCE DAILY   DULoxetine 60 MG capsule Commonly known as: CYMBALTA TAKE 1 CAPSULE BY MOUTH ONCE DAILY   Eliquis 2.5 MG Tabs tablet Generic drug: apixaban TAKE 1 TABLET BY MOUTH TWICE DAILY What changed: how much to take   ergocalciferol 1.25 MG (50000 UT) capsule Commonly known as: VITAMIN D2 Take 1 capsule (50,000 Units total) by mouth once a week. What changed: additional instructions   gabapentin 300 MG capsule Commonly known as: NEURONTIN Take 1 capsule (300 mg total) by mouth at bedtime.   pantoprazole 40 MG tablet Commonly known as: PROTONIX TAKE 1 TABLET BY MOUTH EVERY DAY   polyethylene glycol 17 g packet Commonly known as: MIRALAX / GLYCOLAX Take 17 g by mouth daily as needed for mild constipation.   potassium chloride SA 20 MEQ tablet Commonly known as: KLOR-CON Take 0.5 tablets (10 mEq total) by mouth 2 (two) times daily.   prednisoLONE acetate 1 % ophthalmic suspension Commonly known as: PRED FORTE Place 1 drop into the left eye daily.   rOPINIRole 1 MG tablet Commonly known as: REQUIP TAKE 2 TABLETS BY  MOUTH AT BEDTIME   Santyl ointment Generic drug: collagenase Apply 1 application topically daily.   sulfamethoxazole-trimethoprim 400-80 MG tablet Commonly known as: BACTRIM Take 1 tablet by mouth every 12 (twelve) hours for 2 days. Replaces: sulfamethoxazole-trimethoprim 800-160 MG tablet   tamsulosin 0.4 MG Caps capsule Commonly known as: FLOMAX Take 1 capsule (0.4 mg total) by mouth daily after breakfast. Start taking on: October 28, 2019   tiZANidine 2 MG tablet Commonly known as: ZANAFLEX Take 2 mg by mouth every evening.   traMADol 50 MG tablet Commonly known as: ULTRAM Take 50 mg by mouth daily.            Durable Medical Equipment  (From admission, onward)         Start     Ordered   10/23/19 0000  For home use only DME standard manual wheelchair with seat cushion       Comments: Patient  suffers from a fractured wrist which impairs their ability to perform daily activities like bathing, dressing and grooming in the home.  A walker will not resolve issue with performing activities of daily living. A wheelchair will allow patient to safely perform daily activities. Patient can safely propel the wheelchair in the home or has a caregiver who can provide assistance. Length of need Lifetime. Accessories: elevating leg rests (ELRs), wheel locks, extensions and anti-tippers.   10/23/19 1434          Follow-up Information    Poggi, Marshall Cork, MD Follow up in 1 week(s).   Specialty: Orthopedic Surgery Contact information: [place] [address] [phone]        Ria Bush, MD Follow up in 1 week(s).   Specialty: Family Medicine Contact information: [place] [address] [phone]              Allergies  Allergen Reactions  . Penicillins Rash and Other (See Comments)    Childhood Allergy Has patient had a PCN reaction causing immediate rash, facial/tongue/throat swelling, SOB or lightheadedness with hypotension: Yes Has patient had a PCN reaction causing severe rash involving mucus membranes or skin necrosis: Unknown Has patient had a PCN reaction that required hospitalization: No Has patient had a PCN reaction occurring within the last 10 years: No If all of the above answers are "NO", then may proceed with Cephalosporin use.     Consultations:  Orthopedics   Procedures/Studies: DG Wrist Complete Left  Result Date: 10/23/2019 CLINICAL DATA:  Left wrist pain and deformity following a fall. EXAM: LEFT WRIST - COMPLETE 3+ VIEW COMPARISON:  None. FINDINGS: Comminuted, impacted fracture of the distal radial metaphysis and epiphysis with extension into the radiocarpal joint. Nondisplaced fracture at the base of the ulnar styloid. Dorsal angulation of the distal fragments of the distal radius  fracture without significant displacement. There is also a possible dorsal carpal avulsion fracture. IMPRESSION: 1. Distal radius and ulnar styloid fractures, as described above. 2. Possible dorsal carpal avulsion fracture. Electronically Signed   By: Claudie Revering M.D.   On: 10/23/2019 12:56   CT Head Wo Contrast  Result Date: 10/23/2019 CLINICAL DATA:  Status post fall today with a blow to the head. Initial encounter. EXAM: CT HEAD WITHOUT CONTRAST TECHNIQUE: Contiguous axial images were obtained from the base of the skull through the vertex without intravenous contrast. COMPARISON:  Head CT 07/03/2018. FINDINGS: Brain: No evidence of acute infarction, hemorrhage, hydrocephalus, extra-axial collection or mass lesion/mass effect. Atrophy  and chronic microvascular ischemic change again seen. Vascular: No hyperdense vessel or unexpected calcification. Skull: Intact.  No focal lesion. Sinuses/Orbits: Partial visualization of a mucous retention cyst or polyp in the left maxillary sinus. Other: None. IMPRESSION: No acute abnormality. Atrophy and chronic microvascular ischemic change. Electronically Signed   By: Inge Rise M.D.   On: 10/23/2019 12:50   CT Lumbar Spine Wo Contrast  Result Date: 10/23/2019 CLINICAL DATA:  Low back pain after a fall at home today. Initial encounter. EXAM: CT LUMBAR SPINE WITHOUT CONTRAST TECHNIQUE: Multidetector CT imaging of the lumbar spine was performed without intravenous contrast administration. Multiplanar CT image reconstructions were also generated. COMPARISON:  CT chest, abdomen and pelvis 07/06/2019. FINDINGS: Segmentation: Standard. Alignment: Mild convex left curvature is unchanged.  No listhesis. Vertebrae: No acute fracture. T12 and L1 compression fractures are unchanged. Paraspinal and other soft tissues: Innumerable calcifications in the spleen consistent with old granulomatous disease noted. Extensive atherosclerosis is present. Gallstone is again seen. Disc  levels: There is mild bony retropulsion off the superior endplates both K53 and L1 without stenosis, unchanged. Loss of disc space height and vacuum disc phenomenon at L3-4 and L5-S1 are seen. Central canal stenosis appears worst at L4-5 where there is a broad-based disc bulge and ligamentum flavum thickening. IMPRESSION: No acute abnormality. Remote T12 and L1 compression fractures are unchanged. Lumbar degenerative disease appears worst at L4-5 and unchanged. Electronically Signed   By: Inge Rise M.D.   On: 10/23/2019 15:15   US RENAL  Result Date: 10/23/2019 CLINICAL DATA:  Acute kidney injury. EXAM: RENAL / URINARY TRACT ULTRASOUND COMPLETE COMPARISON:  None. FINDINGS: Right Kidney: Renal measurements: 8.3 x 4.4 x 4.5 cm = volume: 85 mL . Echogenicity within normal limits. No mass or hydronephrosis visualized. Left Kidney: Renal measurements: 8.1 x 4.3 x 4.6 cm = volume: 86 mL. Echogenicity within normal limits. No mass or hydronephrosis visualized. Bladder: Appears normal for degree of bladder distention. Other: None. IMPRESSION: No acute finding sonographically in the bilateral kidneys. Electronically Signed   By: Audie Pinto M.D.   On: 10/23/2019 19:04   DG Foot Complete Right  Result Date: 10/23/2019 CLINICAL DATA:  Foot ulcer, pt states that it just wont heal EXAM: RIGHT FOOT COMPLETE - 3+ VIEW COMPARISON:  Right foot radiographs 09/21/2019 FINDINGS: There is no evidence of fracture or dislocation. There is no evidence of arthropathy or other focal bone abnormality. There is a soft tissue defect along the lateral aspect of the foot near the head of the fifth metatarsal. No osseous changes in the adjacent bones. IMPRESSION: Soft tissue defect along the lateral aspect of the foot likely corresponding to the patient's ulcer. No osseous abnormality identified radiographically. Electronically Signed   By: Audie Pinto M.D.   On: 10/23/2019 18:30      Subjective: Has no complaints  today.  Discharge Exam: Vitals:   10/27/19 0035 10/27/19 0802  BP: 100/80 (!) 129/52  Pulse: 76 79  Resp: 20 16  Temp: 97.9 F (36.6 C) 98.5 F (36.9 C)  SpO2: 92% 93%   Vitals:   10/26/19 0731 10/26/19 1539 10/27/19 0035 10/27/19 0802  BP: 105/60 (!) 123/57 100/80 (!) 129/52  Pulse: 74 82 76 79  Resp: 16 16 20 16   Temp: 98.1 F (36.7 C) 98.1 F (36.7 C) 97.9 F (36.6 C) 98.5 F (36.9 C)  TempSrc: Oral Oral Oral Oral  SpO2: 96% 93% 92% 93%  Weight:      Height:  General: Pt is alert, awake, not in acute distress Cardiovascular: RRR, S1/S2 +, no rubs, no gallops Respiratory: CTA bilaterally, no wheezing, no rhonchi Abdominal: Soft, NT, ND, bowel sounds + Extremities: no edema, no cyanosis, left upper extremity in splint    The results of significant diagnostics from this hospitalization (including imaging, microbiology, ancillary and laboratory) are listed below for reference.     Microbiology: Recent Results (from the past 240 hour(s))  SARS Coronavirus 2 by RT PCR (hospital order, performed in [facility] hospital lab) Nasopharyngeal Nasopharyngeal Swab     Status: None   Collection Time: 10/23/19  3:39 PM   Specimen: Nasopharyngeal Swab  Result Value Ref Range Status   SARS Coronavirus 2 NEGATIVE NEGATIVE Final    Comment: (NOTE) SARS-CoV-2 target nucleic acids are NOT DETECTED.  The SARS-CoV-2 RNA is generally detectable in upper and lower respiratory specimens during the acute phase of infection. The lowest concentration of SARS-CoV-2 viral copies this assay can detect is 250 copies / mL. A negative result does not preclude SARS-CoV-2 infection and should not be used as the sole basis for treatment or other patient management decisions.  A negative result may occur with improper specimen collection / handling, submission of specimen other than nasopharyngeal swab, presence of viral mutation(s) within the areas targeted by this assay, and inadequate  number of viral copies (<250 copies / mL). A negative result must be combined with clinical observations, patient history, and epidemiological information.  Fact Sheet for Patients:   StrictlyIdeas.no  Fact Sheet for Healthcare Providers: BankingDealers.co.za  This test is not yet approved or  cleared by the [place] FDA and has been authorized for detection and/or diagnosis of SARS-CoV-2 by FDA under an Emergency Use Authorization (EUA).  This EUA will remain in effect (meaning this test can be used) for the duration of the COVID-19 declaration under Section 564(b)(1) of the Act, 21 U.S.C. section 360bbb-3(b)(1), unless the authorization is terminated or revoked sooner.  Performed at [facility], [address] [place], [place] [address]      Labs: BNP (last 3 results) No results for input(s): BNP in the last 8760 hours. Basic Metabolic Panel: Recent Labs  Lab 10/23/19 1539 10/24/19 0828 10/25/19 0258 10/26/19 0428 10/27/19 0452  NA 134* 137 135 133* 133*  K 4.6 4.8 5.0 4.7 4.4  CL 98 100 106 100 98  CO2 28 25 25 24 26   GLUCOSE 102* 108* 102* 115* 106*  BUN 30* 35* 36* 29* 27*  CREATININE 2.12* 2.47* 2.08* 1.62* 1.29*  CALCIUM 8.7* 8.5* 8.3* 8.2* 8.5*  MG  --   --  2.2 2.1 2.1   Liver Function Tests: Recent Labs  Lab 10/23/19 1539  AST 20  ALT 18  ALKPHOS 45  BILITOT 0.5  PROT 6.8  ALBUMIN 4.0   No results for input(s): LIPASE, AMYLASE in the last 168 hours. No results for input(s): AMMONIA in the last 168 hours. CBC: Recent Labs  Lab 10/23/19 1539 10/24/19 0828 10/26/19 0428 10/27/19 0452  WBC 6.5 8.3 6.5 5.8  NEUTROABS 5.0  --  4.6 4.4  HGB 11.4* 11.8* 10.4* 10.5*  HCT 34.4* 36.0 30.9* 30.2*  MCV 98.9 99.4 98.7 97.1  PLT 235 221 191 192   Cardiac Enzymes: Recent Labs  Lab 10/24/19 0828  CKTOTAL 165   BNP: Invalid input(s): POCBNP CBG: No results for input(s): GLUCAP in the  last 168 hours. D-Dimer No results for input(s): DDIMER in the last 72 hours.  Hgb A1c No results for input(s): HGBA1C in the last 72 hours. Lipid Profile No results for input(s): CHOL, HDL, LDLCALC, TRIG, CHOLHDL, LDLDIRECT in the last 72 hours. Thyroid function studies No results for input(s): TSH, T4TOTAL, T3FREE, THYROIDAB in the last 72 hours.  Invalid input(s): FREET3 Anemia work up No results for input(s): VITAMINB12, FOLATE, FERRITIN, TIBC, IRON, RETICCTPCT in the last 72 hours. Urinalysis    Component Value Date/Time   COLORURINE YELLOW (A) 10/23/2019 1539   APPEARANCEUR CLEAR (A) 10/23/2019 1539   LABSPEC 1.011 10/23/2019 1539   PHURINE 6.0 10/23/2019 1539   GLUCOSEU NEGATIVE 10/23/2019 1539   HGBUR NEGATIVE 10/23/2019 1539   BILIRUBINUR NEGATIVE 10/23/2019 1539   BILIRUBINUR negative 05/05/2019 1716   KETONESUR NEGATIVE 10/23/2019 1539   PROTEINUR NEGATIVE 10/23/2019 1539   UROBILINOGEN 0.2 05/05/2019 1716   NITRITE NEGATIVE 10/23/2019 1539   LEUKOCYTESUR NEGATIVE 10/23/2019 1539   Sepsis Labs Invalid input(s): PROCALCITONIN,  WBC,  LACTICIDVEN Microbiology Recent Results (from the past 240 hour(s))  SARS Coronavirus 2 by RT PCR (hospital order, performed in [place] hospital lab) Nasopharyngeal Nasopharyngeal Swab     Status: None   Collection Time: 10/23/19  3:39 PM   Specimen: Nasopharyngeal Swab  Result Value Ref Range Status   SARS Coronavirus 2 NEGATIVE NEGATIVE Final    Comment: (NOTE) SARS-CoV-2 target nucleic acids are NOT DETECTED.  The SARS-CoV-2 RNA is generally detectable in upper and lower respiratory specimens during the acute phase of infection. The lowest concentration of SARS-CoV-2 viral copies this assay can detect is 250 copies / mL. A negative result does not preclude SARS-CoV-2 infection and should not be used as the sole basis for treatment or other patient management decisions.  A negative result may occur with improper specimen  collection / handling, submission of specimen other than nasopharyngeal swab, presence of viral mutation(s) within the areas targeted by this assay, and inadequate number of viral copies (<250 copies / mL). A negative result must be combined with clinical observations, patient history, and epidemiological information.  Fact Sheet for Patients:   StrictlyIdeas.no  Fact Sheet for Healthcare Providers: BankingDealers.co.za  This test is not yet approved or  cleared by the [place] FDA and has been authorized for detection and/or diagnosis of SARS-CoV-2 by FDA under an Emergency Use Authorization (EUA).  This EUA will remain in effect (meaning this test can be used) for the duration of the COVID-19 declaration under Section 564(b)(1) of the Act, 21 U.S.C. section 360bbb-3(b)(1), unless the authorization is terminated or revoked sooner.  Performed at [facility], [address] [place], [place] [address]      Time coordinating discharge: Over 30 minutes  SIGNED:   Nolberto Hanlon, MD  [facility] 10/27/2019, 1:57 PM Pager   If 7PM-7AM, please contact night-coverage [email or web address] Password [email or web address]

## 2019-10-27 NOTE — Progress Notes (Signed)
[name] ([id number]) Visit Report for 10/22/2019 HPI Details Patient Name: [name], [name]. Date of Service: 10/22/2019 11:00 AM Medical Record Number: [id number] Patient Account Number: [id number] Date of Birth/Sex: July 16, 1929 (86 y.o. F) Treating RN: Primary Care Provider: Ria Bush Other Clinician: Referring Provider: Ria Bush Treating Provider/Extender: Tito Dine in Treatment: 1 History of Present Illness HPI Description: 10/12/2019 upon evaluation today patient appears to be doing somewhat poorly in regard to her wound at this point. She has a ulcer over the right fifth metatarsal region. This has been managed by podiatry up to this point. Unfortunately the patient just does not seem to be making the progress that they were hoping for an subsequently the patient was referred to [place] by [facility] Dr. Posey Pronto. With that being said I did note that the patient does have peripheral vascular disease which has been managed by Dr. Lazaro Arms her most recent angiogram with intervention was on 08/02/2019. The ABIs following were on the right 1.30 with a TBI of 0.61 normal left 1.31 with a TBI of 0.58. Obviously things seem to be doing somewhat better at this point hopefully enough to allow her to heal. She is on anticoagulant therapy due to atrial fibrillation long-term. With that being said the patient is unfortunately having quite a bit of discomfort at this time secondary to the wound. She also has erythema which is very likely secondary to infection. I am going obtain a culture of the area today once debridement is complete. Apparently the patient also had a x-ray that was performed by Dr. Posey Pronto on 09/21/2019 and this showed that the patient had no obvious signs on x-ray of cortical irregularity and no osteomyelitis obviously noted. With that being said as I discussed with the patient's daughter as well this does not indicate that the patient absolutely has  no osteomyelitis as x-rays are only at best 50% helpful in identifying this complication. Nonetheless I do believe that we need to have the patient continue to have this further evaluated specifically I am to recommend that we check into an MRI which I think would be ideal to evaluate whether or not there is osteomyelitis present. 7/16; this is a patient with an ulcer over the right fifth metatarsal head. [facility] She is due to have an MRI of the foot on 7/26. Using Santyl to the wound Electronic Signature(s) Signed: 10/27/2019 4:41:04 PM By: Linton Ham MD Entered By: Linton Ham on 10/22/2019 11:30:34 [name] ([id number]) -------------------------------------------------------------------------------- Physical Exam Details Patient Name: [name], [name]. Date of Service: 10/22/2019 11:00 AM Medical Record Number: [id number] Patient Account Number: [id number] Date of Birth/Sex: 1929-05-19 (86 y.o. F) Treating RN: Primary Care Provider: Ria Bush Other Clinician: Referring Provider: Ria Bush Treating Provider/Extender: Ricard Dillon Weeks in Treatment: 1 Constitutional Sitting or standing Blood Pressure is within target range for patient.. Pulse regular and within target range for patient.Marland Kitchen Respirations regular, non- labored and within target range.. Temperature is normal and within the target range for the patient.Marland Kitchen appears in no distress. Respiratory Respiratory effort is easy and symmetric bilaterally. Rate is normal at rest and on room air.. Cardiovascular Femoral and popliteal pulses are palpable. Dorsalis pedis and posterior tibial pulses are nonpalpable on the right. Musculoskeletal Significant tenderness over the fifth metatarsal phalangeal joint.. Notes Wound exam; the patient has debris on the wound surface I did not remove this today. I cannot feel her dorsalis pedis  or posterior tibial pulses. Forefoot  is cool popliteal and femoral pulses however are palpable. Electronic Signature(s) Signed: 10/27/2019 4:41:04 PM By: Linton Ham MD Entered By: Linton Ham on 10/22/2019 11:32:00 [name] ([id number]) -------------------------------------------------------------------------------- Physician Orders Details Patient Name: [name] Date of Service: 10/22/2019 11:00 AM Medical Record Number: [id number] Patient Account Number: [id number] Date of Birth/Sex: 1929-08-28 (86 y.o. F) Treating RN: Army Melia Primary Care Provider: Ria Bush Other Clinician: Referring Provider: Ria Bush Treating Provider/Extender: Tito Dine in Treatment: 1 Verbal / Phone Orders: No Diagnosis Coding Wound Cleansing Wound #3 Right Metatarsal head fifth o Clean wound with Normal Saline. Anesthetic (add to Medication List) Wound #3 Right Metatarsal head fifth o Topical Lidocaine 4% cream applied to wound bed prior to debridement (In Clinic Only). Primary Wound Dressing Wound #3 Right Metatarsal head fifth o Santyl Ointment Secondary Dressing o Other - coverlet Wound #3 Right Metatarsal head fifth o Saline moistened gauze Dressing Change Frequency Wound #3 Right Metatarsal head fifth o Change dressing every day. Follow-up Appointments Wound #3 Right Metatarsal head fifth o Return Appointment in 2 weeks. Edema Control Wound #3 Right Metatarsal head fifth o Elevate legs to the level of the heart and pump ankles as often as possible Off-Loading Wound #3 Right Metatarsal head fifth o Other: - Keep pressure off of wound Medications-please add to medication list. Wound #3 Right Metatarsal head fifth o P.O. Antibiotics o Santyl Enzymatic Ointment Electronic Signature(s) Signed: 10/22/2019 11:55:50 AM By: Army Melia Signed: 10/27/2019 4:41:04 PM By: Linton Ham MD Entered By: Army Melia on 10/22/2019 11:23:53 [name]  ([id number]) -------------------------------------------------------------------------------- Problem List Details Patient Name: [name], [name]. Date of Service: 10/22/2019 11:00 AM Medical Record Number: [id number] Patient Account Number: [id number] Date of Birth/Sex: 14-Jan-1930 (86 y.o. F) Treating RN: Primary Care Provider: Ria Bush Other Clinician: Referring Provider: Ria Bush Treating Provider/Extender: Tito Dine in Treatment: 1 Active Problems ICD-10 Encounter Code Description Active Date MDM Diagnosis I73.89 Other specified peripheral vascular diseases 10/12/2019 No Yes L97.512 Non-pressure chronic ulcer of other part of right foot with fat layer 10/12/2019 No Yes exposed Z79.01 Long term (current) use of anticoagulants 10/12/2019 No Yes I48.0 Paroxysmal atrial fibrillation 10/12/2019 No Yes Inactive Problems Resolved Problems Electronic Signature(s) Signed: 10/27/2019 4:41:04 PM By: Linton Ham MD Entered By: Linton Ham on 10/22/2019 11:29:29 [name] ([id number]) -------------------------------------------------------------------------------- Progress Note Details Patient Name: [name]. Date of Service: 10/22/2019 11:00 AM Medical Record Number: [id number] Patient Account Number: [id number] Date of Birth/Sex: 04-01-1930 (86 y.o. F) Treating RN: Primary Care Provider: Ria Bush Other Clinician: Referring Provider: Ria Bush Treating Provider/Extender: Tito Dine in Treatment: 1 Subjective History of Present Illness (HPI) 10/12/2019 upon evaluation today patient appears to be doing somewhat poorly in regard to her wound at this point. She has a ulcer over the right fifth metatarsal region. This has been managed by podiatry up to this point. Unfortunately the patient just does not seem to be making the progress that they were hoping for an subsequently the patient was referred to [place] by [facility] Dr. Posey Pronto. With that being said I did note that the patient does have peripheral vascular disease which has been managed by Dr. Lazaro Arms her most recent angiogram with intervention was on 08/02/2019. The ABIs following were on the right 1.30 with a TBI of 0.61 normal left 1.31 with a TBI of 0.58. Obviously things seem to be doing somewhat better  at this point hopefully enough to allow her to heal. She is on anticoagulant therapy due to atrial fibrillation long-term. With that being said the patient is unfortunately having quite a bit of discomfort at this time secondary to the wound. She also has erythema which is very likely secondary to infection. I am going obtain a culture of the area today once debridement is complete. Apparently the patient also had a x-ray that was performed by Dr. Posey Pronto on 09/21/2019 and this showed that the patient had no obvious signs on x-ray of cortical irregularity and no osteomyelitis obviously noted. With that being said as I discussed with the patient's daughter as well this does not indicate that the patient absolutely has no osteomyelitis as x-rays are only at best 50% helpful in identifying this complication. Nonetheless I do believe that we need to have the patient continue to have this further evaluated specifically I am to recommend that we check into an MRI which I think would be ideal to evaluate whether or not there is osteomyelitis present. 7/16; this is a patient with an ulcer over the right fifth metatarsal head. She [facility]. She is due to have an MRI of the foot on 7/26. Using Santyl to the wound Objective Constitutional Sitting or standing Blood Pressure is within target range for patient.. Pulse regular and within target range for patient.Marland Kitchen Respirations regular, non- labored and within target range.. Temperature is normal and within the target range for the patient.Marland Kitchen appears in no distress. Vitals Time Taken:  11:12 AM, Temperature: 97.7 F, Pulse: 63 bpm, Respiratory Rate: 16 breaths/min, Blood Pressure: 113/43 mmHg. Respiratory Respiratory effort is easy and symmetric bilaterally. Rate is normal at rest and on room air.. Cardiovascular Femoral and popliteal pulses are palpable. Dorsalis pedis and posterior tibial pulses are nonpalpable on the right. Musculoskeletal Significant tenderness over the fifth metatarsal phalangeal joint.. General Notes: Wound exam; the patient has debris on the wound surface I did not remove this today. I cannot feel her dorsalis pedis or posterior tibial pulses. Forefoot is cool popliteal and femoral pulses however are palpable. Integumentary (Hair, Skin) Wound #3 status is Open. Original cause of wound was Gradually Appeared. The wound is located on the Right Metatarsal head fifth. The wound measures 1.2cm length x 1.2cm width x 0.2cm depth; 1.131cm^2 area and 0.226cm^3 volume. There is Fat Layer (Subcutaneous Tissue) Exposed exposed. There is no tunneling or undermining noted. There is a medium amount of serosanguineous drainage noted. The wound margin is distinct with the outline attached to the wound base. There is small (1-33%) red granulation within the wound bed. There is a large (67-100%) amount of necrotic tissue within the wound bed including Adherent Slough. Assessment Active Problems ICD-10 [name], [name] ([id number]) Other specified peripheral vascular diseases Non-pressure chronic ulcer of other part of right foot with fat layer exposed Long term (current) use of anticoagulants Paroxysmal atrial fibrillation Plan Wound Cleansing: Wound #3 Right Metatarsal head fifth: Clean wound with Normal Saline. Anesthetic (add to Medication List): Wound #3 Right Metatarsal head fifth: Topical Lidocaine 4% cream applied to wound bed prior to debridement (In Clinic Only). Primary Wound Dressing: Wound #3 Right Metatarsal head fifth: Santyl Ointment Secondary  Dressing: Other - coverlet Wound #3 Right Metatarsal head fifth: Saline moistened gauze Dressing Change Frequency: Wound #3 Right Metatarsal head fifth: Change dressing every day. Follow-up Appointments: Wound #3 Right Metatarsal head fifth: Return Appointment in 2 weeks. Edema Control: Wound #3  Right Metatarsal head fifth: Elevate legs to the level of the heart and pump ankles as often as possible Off-Loading: Wound #3 Right Metatarsal head fifth: Other: - Keep pressure off of wound Medications-please add to medication list.: Wound #3 Right Metatarsal head fifth: P.O. Antibiotics Santyl Enzymatic Ointment 1. I am continuing Santyl ointment to this area. 2. Somewhat surprised that her last arterial studies in May were so good I wonder about the patency of the angioplasties that she describes she has had on the right by Dr. Lucky Cowboy 3. She has an MRI on 7/26. I think the likelihood of osteomyelitis here is high. 4. She is being careful to offload this area if she is a minimal ambulator I did not really get a history of claudication Electronic Signature(s) Signed: 10/27/2019 4:41:04 PM By: Linton Ham MD Entered By: Linton Ham on 10/22/2019 11:33:31 [name] ([id number]) -------------------------------------------------------------------------------- [place] Details Patient Name: [name]. Date of Service: 10/22/2019 Medical Record Number: [id number] Patient Account Number: [id number] Date of Birth/Sex: December 13, 1929 (86 y.o. F) Treating RN: Primary Care Provider: Ria Bush Other Clinician: Referring Provider: Ria Bush Treating Provider/Extender: Tito Dine in Treatment: 1 Diagnosis Coding ICD-10 Codes Code Description I73.89 Other specified peripheral vascular diseases L97.512 Non-pressure chronic ulcer of other part of right foot with fat layer exposed Z79.01 Long term (current) use of anticoagulants I48.0 Paroxysmal atrial  fibrillation Facility Procedures CPT4 Code: [id number] Description: 99213 - WOUND CARE VISIT-LEV 3 EST PT Modifier: Quantity: 1 Physician Procedures CPT4 Code: [id number] Description: 97416 - WC PHYS LEVEL 3 - EST PT Modifier: Quantity: 1 CPT4 Code: Description: ICD-10 Diagnosis Description L97.512 Non-pressure chronic ulcer of other part of right foot with fat layer e I73.89 Other specified peripheral vascular diseases Modifier: xposed Quantity: Electronic Signature(s) Signed: 10/27/2019 4:41:04 PM By: Linton Ham MD Entered By: Linton Ham on 10/22/2019 11:33:49

## 2019-10-27 NOTE — Progress Notes (Signed)
[name], [name] ([id number]) Visit Report for 10/22/2019 Arrival Information Details Patient Name: [name], [name]. Date of Service: 10/22/2019 11:00 AM Medical Record Number: [id number] Patient Account Number: [id number] Date of Birth/Sex: February 24, 1930 (86 y.o. F) Treating RN: Army Melia Primary Care Goldman Birchall: Ria Bush Other Clinician: Referring Azzam Mehra: Ria Bush Treating Novalee Horsfall/Extender: Tito Dine in Treatment: 1 Visit Information History Since Last Visit Added or deleted any medications: No Patient Arrived: Walker Any new allergies or adverse reactions: No Arrival Time: 11:11 Had a fall or experienced change in No Accompanied By: daughter activities of daily living that may affect Transfer Assistance: None risk of falls: Patient Identification Verified: Yes Signs or symptoms of abuse/neglect since last visito No Patient Has Alerts: Yes Hospitalized since last visit: No Patient Alerts: 08/31/19 ABI R)1.3 L)1.31 Has Dressing in Place as Prescribed: Yes 08/31/19 TBI R).61 L).58 Pain Present Now: No Electronic Signature(s) Signed: 10/22/2019 2:48:21 PM By: Gretta Cool, BSN, RN, CWS, Kim RN, BSN Previous Signature: 10/22/2019 11:55:50 AM Version By: Army Melia Entered By: Gretta Cool BSN, RN, CWS, Kim on 10/22/2019 14:48:21 [name], [name] ([id number]) -------------------------------------------------------------------------------- Clinic Level of Care Assessment Details Patient Name: JASSLYN, FINKEL. Date of Service: 10/22/2019 11:00 AM Medical Record Number: [id number] Patient Account Number: [id number] Date of Birth/Sex: 1929-04-13 (86 y.o. F) Treating RN: Army Melia Primary Care Dontee Jaso: Ria Bush Other Clinician: Referring Alizzon Dioguardi: Ria Bush Treating Elleni Mozingo/Extender: Tito Dine in Treatment: 1 Clinic Level of Care Assessment Items TOOL 4 Quantity Score []  - Use when only an EandM is performed on FOLLOW-UP visit  0 ASSESSMENTS - Nursing Assessment / Reassessment X - Reassessment of Co-morbidities (includes updates in patient status) 1 10 X- 1 5 Reassessment of Adherence to Treatment Plan ASSESSMENTS - Wound and Skin Assessment / Reassessment X - Simple Wound Assessment / Reassessment - one wound 1 5 []  - 0 Complex Wound Assessment / Reassessment - multiple wounds []  - 0 Dermatologic / Skin Assessment (not related to wound area) ASSESSMENTS - Focused Assessment []  - Circumferential Edema Measurements - multi extremities 0 []  - 0 Nutritional Assessment / Counseling / Intervention []  - 0 Lower Extremity Assessment (monofilament, tuning fork, pulses) []  - 0 Peripheral Arterial Disease Assessment (using hand held doppler) ASSESSMENTS - Ostomy and/or Continence Assessment and Care []  - Incontinence Assessment and Management 0 []  - 0 Ostomy Care Assessment and Management (repouching, etc.) PROCESS - Coordination of Care X - Simple Patient / Family Education for ongoing care 1 15 []  - 0 Complex (extensive) Patient / Family Education for ongoing care X- 1 10 Staff obtains Programmer, systems, Records, Test Results / Process Orders []  - 0 Staff telephones HHA, Nursing Homes / Clarify orders / etc []  - 0 Routine Transfer to another Facility (non-emergent condition) []  - 0 Routine Hospital Admission (non-emergent condition) []  - 0 New Admissions / Biomedical engineer / Ordering NPWT, Apligraf, etc. []  - 0 Emergency Hospital Admission (emergent condition) X- 1 10 Simple Discharge Coordination []  - 0 Complex (extensive) Discharge Coordination PROCESS - Special Needs []  - Pediatric / Minor Patient Management 0 []  - 0 Isolation Patient Management []  - 0 Hearing / Language / Visual special needs []  - 0 Assessment of Community assistance (transportation, D/C planning, etc.) []  - 0 Additional assistance / Altered mentation []  - 0 Support Surface(s) Assessment (bed, cushion, seat,  etc.) INTERVENTIONS - Wound Cleansing / Measurement Arechiga, Viann S. ([id number]) X- 1 5 Simple Wound Cleansing - one wound []  - 0 Complex Wound Cleansing - multiple  wounds X- 1 5 Wound Imaging (photographs - any number of wounds) []  - 0 Wound Tracing (instead of photographs) X- 1 5 Simple Wound Measurement - one wound []  - 0 Complex Wound Measurement - multiple wounds INTERVENTIONS - Wound Dressings []  - Small Wound Dressing one or multiple wounds 0 X- 1 15 Medium Wound Dressing one or multiple wounds []  - 0 Large Wound Dressing one or multiple wounds []  - 0 Application of Medications - topical []  - 0 Application of Medications - injection INTERVENTIONS - Miscellaneous []  - External ear exam 0 []  - 0 Specimen Collection (cultures, biopsies, blood, body fluids, etc.) []  - 0 Specimen(s) / Culture(s) sent or taken to Lab for analysis []  - 0 Patient Transfer (multiple staff / Civil Service fast streamer / Similar devices) []  - 0 Simple Staple / Suture removal (25 or less) []  - 0 Complex Staple / Suture removal (26 or more) []  - 0 Hypo / Hyperglycemic Management (close monitor of Blood Glucose) []  - 0 Ankle / Brachial Index (ABI) - do not check if billed separately X- 1 5 Vital Signs Has the patient been seen at the hospital within the last three years: Yes Total Score: 90 Level Of Care: New/Established - Level 3 Electronic Signature(s) Signed: 10/22/2019 11:55:50 AM By: Army Melia Entered By: Army Melia on 10/22/2019 11:24:23 [name] ([id number]) -------------------------------------------------------------------------------- Encounter Discharge Information Details Patient Name: [name]. Date of Service: 10/22/2019 11:00 AM Medical Record Number: [id number] Patient Account Number: [id number] Date of Birth/Sex: 1930/02/22 (86 y.o. F) Treating RN: Army Melia Primary Care Tyia Binford: Ria Bush Other Clinician: Referring Boaz Berisha: Ria Bush Treating Montserrat Shek/Extender: Tito Dine in Treatment: 1 Encounter Discharge Information Items Discharge Condition: Stable Ambulatory Status: Walker Discharge Destination: Home Transportation: Private Auto Accompanied By: daughter Schedule Follow-up Appointment: Yes Clinical Summary of Care: Electronic Signature(s) Signed: 10/22/2019 11:55:50 AM By: Army Melia Entered By: Army Melia on 10/22/2019 11:25:48 [name] ([id number]) -------------------------------------------------------------------------------- Lower Extremity Assessment Details Patient Name: [name]. Date of Service: 10/22/2019 11:00 AM Medical Record Number: [id number] Patient Account Number: [id number] Date of Birth/Sex: 06-23-1929 (86 y.o. F) Treating RN: Army Melia Primary Care Elonda Giuliano: Ria Bush Other Clinician: Referring Jeidy Hoerner: Ria Bush Treating Kory Rains/Extender: Ricard Dillon Weeks in Treatment: 1 Edema Assessment Assessed: [Left: No] [Right: No] Edema: [Left: N] [Right: o] Vascular Assessment Pulses: Dorsalis Pedis Palpable: [Right:Yes] Electronic Signature(s) Signed: 10/22/2019 11:55:50 AM By: Army Melia Entered By: Army Melia on 10/22/2019 11:16:26 [name] ([id number]) -------------------------------------------------------------------------------- Multi Wound Chart Details Patient Name: [name]. Date of Service: 10/22/2019 11:00 AM Medical Record Number: [id number] Patient Account Number: [id number] Date of Birth/Sex: 1929-10-23 (86 y.o. F) Treating RN: Army Melia Primary Care Rontrell Moquin: Ria Bush Other Clinician: Referring Mayra Brahm: Ria Bush Treating Arthurine Oleary/Extender: Tito Dine in Treatment: 1 Vital Signs Height(in): Pulse(bpm): 76 Weight(lbs): Blood Pressure(mmHg): 113/43 Body Mass Index(BMI): Temperature(F): 97.7 Respiratory Rate(breaths/min): 16 Photos: [N/A:N/A] Wound  Location: Right Metatarsal head fifth N/A N/A Wounding Event: Gradually Appeared N/A N/A Primary Etiology: Arterial Insufficiency Ulcer N/A N/A Comorbid History: Arrhythmia, Osteoarthritis N/A N/A Date Acquired: 04/09/2019 N/A N/A Weeks of Treatment: 1 N/A N/A Wound Status: Open N/A N/A Measurements L x W x D (cm) 1.2x1.2x0.2 N/A N/A Area (cm) : 1.131 N/A N/A Volume (cm) : 0.226 N/A N/A % Reduction in Area: 0.00% N/A N/A % Reduction in Volume: -100.00% N/A N/A Classification: Full Thickness Without Exposed N/A N/A Support Structures Exudate Amount: Medium N/A N/A Exudate Type: Serosanguineous  N/A N/A Exudate Color: red, brown N/A N/A Wound Margin: Distinct, outline attached N/A N/A Granulation Amount: Small (1-33%) N/A N/A Granulation Quality: Red N/A N/A Necrotic Amount: Large (67-100%) N/A N/A Exposed Structures: Fat Layer (Subcutaneous Tissue) N/A N/A Exposed: Yes Fascia: No Tendon: No Muscle: No Joint: No Bone: No Epithelialization: None N/A N/A Treatment Notes Wound #3 (Right Metatarsal head fifth) Notes santyl, saline gauze, coverlet Electronic Signature(s) [name], [name] ([id number]) Signed: 10/27/2019 4:41:04 PM By: Linton Ham MD Entered By: Linton Ham on 10/22/2019 11:29:38 [name] ([id number]) -------------------------------------------------------------------------------- Multi-Disciplinary Care Plan Details Patient Name: [name]. Date of Service: 10/22/2019 11:00 AM Medical Record Number: [id number] Patient Account Number: [id number] Date of Birth/Sex: 06-10-1929 (86 y.o. F) Treating RN: Army Melia Primary Care Tramaine Snell: Ria Bush Other Clinician: Referring Christyl Osentoski: Ria Bush Treating Keena Dinse/Extender: Tito Dine in Treatment: 1 Active Inactive Abuse / Safety / Falls / Self Care Management Nursing Diagnoses: Potential for falls Goals: Patient/caregiver will verbalize understanding of skin care  regimen Date Initiated: 10/12/2019 Target Resolution Date: 11/09/2019 Goal Status: Active Interventions: Assess fall risk on admission and as needed Notes: Necrotic Tissue Nursing Diagnoses: Impaired tissue integrity related to necrotic/devitalized tissue Goals: Necrotic/devitalized tissue will be minimized in the wound bed Date Initiated: 10/12/2019 Target Resolution Date: 11/09/2019 Goal Status: Active Interventions: Assess patient pain level pre-, during and post procedure and prior to discharge Treatment Activities: Enzymatic debridement : 10/12/2019 Notes: Orientation to the Wound Care Program Nursing Diagnoses: Knowledge deficit related to the wound healing center program Goals: Patient/caregiver will verbalize understanding of the [place] Program Date Initiated: 10/12/2019 Target Resolution Date: 11/09/2019 Goal Status: Active Interventions: Provide education on orientation to the wound center Notes: Pain, Acute or Chronic Nursing Diagnoses: Potential alteration in comfort, pain Goals: Patient will verbalize adequate pain control and receive pain control interventions during procedures as needed [name], [name] ([id number]) Date Initiated: 10/12/2019 Target Resolution Date: 11/09/2019 Goal Status: Active Interventions: Complete pain assessment as per visit requirements Treatment Activities: Administer pain control measures as ordered : 10/12/2019 Notes: Peripheral Neuropathy Nursing Diagnoses: Knowledge deficit related to disease process and management of peripheral neurovascular dysfunction Potential alteration in peripheral tissue perfusion (select prior to confirmation of diagnosis) Goals: Patient/caregiver will verbalize understanding of disease process and disease management Date Initiated: 10/12/2019 Target Resolution Date: 11/09/2019 Goal Status: Active Interventions: Assess signs and symptoms of neuropathy upon admission and as needed Provide education on  Management of Neuropathy and Related Ulcers Notes: Wound/Skin Impairment Nursing Diagnoses: Impaired tissue integrity Goals: Ulcer/skin breakdown will have a volume reduction of 30% by week 4 Date Initiated: 10/12/2019 Target Resolution Date: 11/09/2019 Goal Status: Active Interventions: Assess ulceration(s) every visit Treatment Activities: Skin care regimen initiated : 10/12/2019 Topical wound management initiated : 10/12/2019 Notes: Electronic Signature(s) Signed: 10/22/2019 11:55:50 AM By: Army Melia Entered By: Army Melia on 10/22/2019 11:16:31 [name] ([id number]) -------------------------------------------------------------------------------- Pain Assessment Details Patient Name: [name]. Date of Service: 10/22/2019 11:00 AM Medical Record Number: [id number] Patient Account Number: [id number] Date of Birth/Sex: 1930-01-16 (86 y.o. F) Treating RN: Army Melia Primary Care Zyron Deeley: Ria Bush Other Clinician: Referring Arie Gable: Ria Bush Treating Pinky Ravan/Extender: Tito Dine in Treatment: 1 Active Problems Location of Pain Severity and Description of Pain Patient Has Paino No Site Locations Pain Management and Medication Current Pain Management: Electronic Signature(s) Signed: 10/22/2019 11:55:50 AM By: Army Melia Entered By: Army Melia on 10/22/2019 11:12:27 [name] ([id number]) -------------------------------------------------------------------------------- Patient/Caregiver Education Details Patient Name:  [name], Crystal S. Date of Service: 10/22/2019 11:00 AM Medical Record Number: [id number] Patient Account Number: [id number] Date of Birth/Gender: 1929-11-01 (86 y.o. F) Treating RN: Army Melia Primary Care Physician: Ria Bush Other Clinician: Referring Physician: Ria Bush Treating Physician/Extender: Tito Dine in Treatment: 1 Education Assessment Education Provided  To: Patient Education Topics Provided Wound/Skin Impairment: Handouts: Caring for Your Ulcer Methods: Demonstration, Explain/Verbal Responses: State content correctly Electronic Signature(s) Signed: 10/22/2019 11:55:50 AM By: Army Melia Entered By: Army Melia on 10/22/2019 11:25:00 [name] ([id number]) -------------------------------------------------------------------------------- Wound Assessment Details Patient Name: [name]. Date of Service: 10/22/2019 11:00 AM Medical Record Number: [id number] Patient Account Number: [id number] Date of Birth/Sex: 03/12/1930 (86 y.o. F) Treating RN: Army Melia Primary Care Dani Danis: Ria Bush Other Clinician: Referring Amery Minasyan: Ria Bush Treating Maggie Senseney/Extender: Tito Dine in Treatment: 1 Wound Status Wound Number: 3 Primary Etiology: Arterial Insufficiency Ulcer Wound Location: Right Metatarsal head fifth Wound Status: Open Wounding Event: Gradually Appeared Comorbid History: Arrhythmia, Osteoarthritis Date Acquired: 04/09/2019 Weeks Of Treatment: 1 Clustered Wound: No Photos Wound Measurements Length: (cm) 1.2 Width: (cm) 1.2 Depth: (cm) 0.2 Area: (cm) 1.131 Volume: (cm) 0.226 % Reduction in Area: 0% % Reduction in Volume: -100% Epithelialization: None Tunneling: No Undermining: No Wound Description Classification: Full Thickness Without Exposed Support Structures Wound Margin: Distinct, outline attached Exudate Amount: Medium Exudate Type: Serosanguineous Exudate Color: red, brown Foul Odor After Cleansing: No Slough/Fibrino Yes Wound Bed Granulation Amount: Small (1-33%) Exposed Structure Granulation Quality: Red Fascia Exposed: No Necrotic Amount: Large (67-100%) Fat Layer (Subcutaneous Tissue) Exposed: Yes Necrotic Quality: Adherent Slough Tendon Exposed: No Muscle Exposed: No Joint Exposed: No Bone Exposed: No Treatment Notes Wound #3 (Right Metatarsal head  fifth) Notes santyl, saline gauze, coverlet Electronic Signature(s) Signed: 10/22/2019 11:55:50 AM By: Sanjuana Mae ([id number]) Entered By: Army Melia on 10/22/2019 11:15:32 [name] ([id number]) -------------------------------------------------------------------------------- Vitals Details Patient Name: [name]. Date of Service: 10/22/2019 11:00 AM Medical Record Number: [id number] Patient Account Number: [id number] Date of Birth/Sex: [birth year] (86 y.o. F) Treating RN: Army Melia Primary Care Masiyah Jorstad: Ria Bush Other Clinician: Referring Melodie Ashworth: Ria Bush Treating Alantra Popoca/Extender: Tito Dine in Treatment: 1 Vital Signs Time Taken: 11:12 Temperature (F): 97.7 Pulse (bpm): 63 Respiratory Rate (breaths/min): 16 Blood Pressure (mmHg): 113/43 Reference Range: 80 - 120 mg / dl Electronic Signature(s) Signed: 10/22/2019 11:55:50 AM By: Army Melia Entered By: Army Melia on 10/22/2019 11:12:22

## 2019-10-27 NOTE — Progress Notes (Signed)
EMS here to transport pt, daughter at bedside, belongings sent with daughter

## 2019-10-27 NOTE — TOC Progression Note (Addendum)
Transition of Care [facility]) - Progression Note    Patient Details  Name: [name] MRN: [id number] Date of Birth: 11/14/1929  Transition of [facility]) CM/SW Ruch, RN Phone Number: 10/27/2019, 2:24 PM  Clinical Narrative:   The patient is to Discharge to [facility] today to room 27B The DC packet is on the chart including DNR, The daughter is aware, The patient is ready to DC and TOC CM called EMS to transport the bedside nurse called report to [facility], Patient has been fully vaccinated          Expected Discharge Plan and Services           Expected Discharge Date: 10/27/19                                     Social Determinants of Health (SDOH) Interventions    Readmission Risk Interventions No flowsheet data found.

## 2019-10-28 ENCOUNTER — Telehealth: Payer: Self-pay

## 2019-10-28 DIAGNOSIS — Z9181 History of falling: Secondary | ICD-10-CM | POA: Diagnosis not present

## 2019-10-28 DIAGNOSIS — N1831 Chronic kidney disease, stage 3a: Secondary | ICD-10-CM | POA: Diagnosis not present

## 2019-10-28 DIAGNOSIS — S62102D Fracture of unspecified carpal bone, left wrist, subsequent encounter for fracture with routine healing: Secondary | ICD-10-CM | POA: Diagnosis not present

## 2019-10-28 DIAGNOSIS — I130 Hypertensive heart and chronic kidney disease with heart failure and stage 1 through stage 4 chronic kidney disease, or unspecified chronic kidney disease: Secondary | ICD-10-CM | POA: Diagnosis not present

## 2019-10-28 NOTE — Telephone Encounter (Signed)
[name] (DPR signed) left v/m requesting cb. I spoke with [name] and pt had fallen and broken left wrist and was in hospital for 4 days. Pt is presently at [facility] for rehab and was admitted to [facility] on 10/27/19 late in day. Not sure how long will be at rehab. Pt has appt for B 12 injection and [name] cancelled that appt and [name] will see about getting the B12 while pt is at rehab. Pt is on a lot of medicine and [name] plans to be at facility to see the provider that will be taking care of pt while there. [name] wanted Dr Darnell Level to know what was going on with pt and [name] will cb if needed. [name] is going to leave 11/16/19 30' appt until gets some idea when pt will be able to leave facility and [name] will cb and change that appt if needed. FYI to Dr Darnell Level.

## 2019-10-28 NOTE — Telephone Encounter (Signed)
Noted. Agree with all above. Thank you.

## 2019-10-29 DIAGNOSIS — M25532 Pain in left wrist: Secondary | ICD-10-CM | POA: Diagnosis not present

## 2019-10-29 DIAGNOSIS — S52602A Unspecified fracture of lower end of left ulna, initial encounter for closed fracture: Secondary | ICD-10-CM | POA: Diagnosis not present

## 2019-10-29 DIAGNOSIS — S52502A Unspecified fracture of the lower end of left radius, initial encounter for closed fracture: Secondary | ICD-10-CM | POA: Diagnosis not present

## 2019-11-01 ENCOUNTER — Other Ambulatory Visit: Payer: Self-pay

## 2019-11-01 ENCOUNTER — Other Ambulatory Visit: Payer: Self-pay | Admitting: Family Medicine

## 2019-11-01 ENCOUNTER — Ambulatory Visit
Admission: RE | Admit: 2019-11-01 | Discharge: 2019-11-01 | Disposition: A | Discharge status: Home / Self Care | Payer: Medicare Other | Source: Ambulatory Visit | Attending: Physician Assistant | Admitting: Physician Assistant

## 2019-11-01 DIAGNOSIS — I10 Essential (primary) hypertension: Secondary | ICD-10-CM

## 2019-11-01 DIAGNOSIS — L97512 Non-pressure chronic ulcer of other part of right foot with fat layer exposed: Secondary | ICD-10-CM

## 2019-11-01 DIAGNOSIS — E538 Deficiency of other specified B group vitamins: Secondary | ICD-10-CM

## 2019-11-01 DIAGNOSIS — L03115 Cellulitis of right lower limb: Secondary | ICD-10-CM | POA: Diagnosis not present

## 2019-11-01 DIAGNOSIS — E559 Vitamin D deficiency, unspecified: Secondary | ICD-10-CM

## 2019-11-01 DIAGNOSIS — E782 Mixed hyperlipidemia: Secondary | ICD-10-CM

## 2019-11-01 DIAGNOSIS — I4819 Other persistent atrial fibrillation: Secondary | ICD-10-CM

## 2019-11-01 DIAGNOSIS — R600 Localized edema: Secondary | ICD-10-CM | POA: Diagnosis not present

## 2019-11-01 DIAGNOSIS — L97519 Non-pressure chronic ulcer of other part of right foot with unspecified severity: Secondary | ICD-10-CM | POA: Diagnosis not present

## 2019-11-01 MED ORDER — GADOBUTROL 1 MMOL/ML IV SOLN
6.0000 mL | Freq: Once | INTRAVENOUS | Status: AC | PRN
  Administered 2019-11-01: 6 mL via INTRAVENOUS

## 2019-11-02 ENCOUNTER — Other Ambulatory Visit: Payer: Medicare Other

## 2019-11-02 ENCOUNTER — Ambulatory Visit: Payer: Medicare Other

## 2019-11-02 DIAGNOSIS — S62102D Fracture of unspecified carpal bone, left wrist, subsequent encounter for fracture with routine healing: Secondary | ICD-10-CM | POA: Diagnosis not present

## 2019-11-02 DIAGNOSIS — S32010D Wedge compression fracture of first lumbar vertebra, subsequent encounter for fracture with routine healing: Secondary | ICD-10-CM | POA: Diagnosis not present

## 2019-11-02 DIAGNOSIS — I272 Pulmonary hypertension, unspecified: Secondary | ICD-10-CM | POA: Diagnosis not present

## 2019-11-02 DIAGNOSIS — I2510 Atherosclerotic heart disease of native coronary artery without angina pectoris: Secondary | ICD-10-CM | POA: Diagnosis not present

## 2019-11-04 ENCOUNTER — Encounter: Payer: Medicare Other | Admitting: Physician Assistant

## 2019-11-04 DIAGNOSIS — I739 Peripheral vascular disease, unspecified: Secondary | ICD-10-CM | POA: Diagnosis not present

## 2019-11-04 DIAGNOSIS — L97512 Non-pressure chronic ulcer of other part of right foot with fat layer exposed: Secondary | ICD-10-CM | POA: Diagnosis not present

## 2019-11-04 DIAGNOSIS — I480 Paroxysmal atrial fibrillation: Secondary | ICD-10-CM | POA: Diagnosis not present

## 2019-11-04 DIAGNOSIS — I70235 Atherosclerosis of native arteries of right leg with ulceration of other part of foot: Secondary | ICD-10-CM | POA: Diagnosis not present

## 2019-11-04 DIAGNOSIS — Z7901 Long term (current) use of anticoagulants: Secondary | ICD-10-CM | POA: Diagnosis not present

## 2019-11-04 DIAGNOSIS — M1990 Unspecified osteoarthritis, unspecified site: Secondary | ICD-10-CM | POA: Diagnosis not present

## 2019-11-04 NOTE — Progress Notes (Addendum)
[name], [name] ([id number]) Visit Report for 11/04/2019 Chief Complaint Document Details Patient Name: [name], [name]. Date of Service: 11/04/2019 10:00 AM Medical Record Number: [id number] Patient Account Number: [id number] Date of Birth/Sex: 02/26/1930 (86 y.o. F) Treating RN: Cornell Barman Primary Care Provider: Ria Bush Other Clinician: Referring Provider: Ria Bush Treating Provider/Extender: Melburn Hake, Tamiah Dysart Weeks in Treatment: 3 Information Obtained from: Patient Chief Complaint Right Lateral Foot Ulcer Electronic Signature(s) Signed: 11/04/2019 10:03:44 AM By: Worthy Keeler PA-C Entered By: Worthy Keeler on 11/04/2019 10:03:43 [name] ([id number]) -------------------------------------------------------------------------------- Debridement Details Patient Name: [name] Date of Service: 11/04/2019 10:00 AM Medical Record Number: [id number] Patient Account Number: [id number] Date of Birth/Sex: 10/21/1929 (86 y.o. F) Treating RN: Cornell Barman Primary Care Provider: Ria Bush Other Clinician: Referring Provider: Ria Bush Treating Provider/Extender: Melburn Hake, Brittane Grudzinski Weeks in Treatment: 3 Debridement Performed for Wound #3 Right Metatarsal head fifth Assessment: Performed By: Physician STONE III, Clint Strupp E., PA-C Debridement Type: Debridement Severity of Tissue Pre Debridement: Fat layer exposed Level of Consciousness (Pre- Awake and Alert procedure): Pre-procedure Verification/Time Out Yes - 10:48 Taken: Total Area Debrided (L x W): 0.9 (cm) x 0.9 (cm) = 0.81 (cm) Tissue and other material Slough, Subcutaneous, Slough debrided: Level: Skin/Subcutaneous Tissue Debridement Description: Excisional Instrument: Curette, Other : tongue blade Bleeding: None Response to Treatment: Procedure was tolerated well Level of Consciousness (Post- Awake and Alert procedure): Post Debridement Measurements of Total Wound Length: (cm)  0.9 Width: (cm) 0.9 Depth: (cm) 0.3 Volume: (cm) 0.191 Character of Wound/Ulcer Post Debridement: Stable Severity of Tissue Post Debridement: Fat layer exposed Post Procedure Diagnosis Same as Pre-procedure Electronic Signature(s) Signed: 11/04/2019 5:01:28 PM By: Worthy Keeler PA-C Signed: 11/04/2019 6:25:55 PM By: Gretta Cool, BSN, RN, CWS, Kim RN, BSN Entered By: Gretta Cool, BSN, RN, CWS, Kim on 11/04/2019 10:49:37 [name] ([id number]) -------------------------------------------------------------------------------- HPI Details Patient Name: [name]. Date of Service: 11/04/2019 10:00 AM Medical Record Number: [id number] Patient Account Number: [id number] Date of Birth/Sex: 1929/09/16 (86 y.o. F) Treating RN: Cornell Barman Primary Care Provider: Ria Bush Other Clinician: Referring Provider: Ria Bush Treating Provider/Extender: Melburn Hake, Jadzia Ibsen Weeks in Treatment: 3 History of Present Illness HPI Description: 10/12/2019 upon evaluation today patient appears to be doing somewhat poorly in regard to her wound at this point. She has a ulcer over the right fifth metatarsal region. This has been managed by podiatry up to this point. Unfortunately the patient just does not seem to be making the progress that they were hoping for an subsequently the patient was referred to [place] by [facility] Dr. Posey Pronto. With that being said I did note that the patient does have peripheral vascular disease which has been managed by Dr. Lazaro Arms her most recent angiogram with intervention was on 08/02/2019. The ABIs following were on the right 1.30 with a TBI of 0.61 normal left 1.31 with a TBI of 0.58. Obviously things seem to be doing somewhat better at this point hopefully enough to allow her to heal. She is on anticoagulant therapy due to atrial fibrillation long-term. With that being said the patient is unfortunately having quite a bit of discomfort at this time secondary to the  wound. She also has erythema which is very likely secondary to infection. I am going obtain a culture of the area today once debridement is complete. Apparently the patient also had a x-ray that was performed by Dr. Posey Pronto on 09/21/2019 and this showed that the patient had no  obvious signs on x-ray of cortical irregularity and no osteomyelitis obviously noted. With that being said as I discussed with the patient's daughter as well this does not indicate that the patient absolutely has no osteomyelitis as x-rays are only at best 50% helpful in identifying this complication. Nonetheless I do believe that we need to have the patient continue to have this further evaluated specifically I am to recommend that we check into an MRI which I think would be ideal to evaluate whether or not there is osteomyelitis present. 7/16; this is a patient with an ulcer over the right fifth metatarsal head. She [facility]. She is due to have an MRI of the foot on 7/26. Using Santyl to the wound 11/04/2019 on evaluation today patient appears to be doing okay in regard to her wound at this time. Unfortunately since I last saw her which was actually her second visit here in the clinic she has moved into a new apartment and then subsequently had a fall. She ended up in the hospital she did fracture her left arm and subsequently has also been dealing with a lot of dizziness she has been at [employer]. She tells me that getting in the change the dressing has been somewhat difficult as far as daily dressing changes are concerned with the Santyl. Nonetheless when they do change it that is what is been used and her daughter-in-law has also been coming in to help out as well with the dressing changes to try to make sure it gets done daily. Fortunately there is no signs of active infection at this time. No fever chills noted. I do believe the Santyl has been beneficial and is likely still the best  way to go at this point for the patient. I did review the patient's MRI which showed some potential for reactive osteitis but did not show any signs of osteomyelitis overtly. That cannot be excluded but again right now it does not appear that is very likely present as well. This is good news. I also did review her culture she has been on the Bactrim she tells me she has taken this and again that was appropriate based on the results of her culture. Electronic Signature(s) Signed: 11/04/2019 2:41:32 PM By: Worthy Keeler PA-C Previous Signature: 11/04/2019 2:39:41 PM Version By: Worthy Keeler PA-C Entered By: Worthy Keeler on 11/04/2019 14:41:32 TOLEEN, LACHAPELLE ([id number]) -------------------------------------------------------------------------------- Physical Exam Details Patient Name: [name] Date of Service: 11/04/2019 10:00 AM Medical Record Number: [id number] Patient Account Number: [id number] Date of Birth/Sex: [birth year] (86 y.o. F) Treating RN: Cornell Barman Primary Care Provider: Ria Bush Other Clinician: Referring Provider: Ria Bush Treating Provider/Extender: STONE III, Violeta Lecount Weeks in Treatment: 3 Constitutional Well-nourished and well-hydrated in no acute distress. Respiratory normal breathing without difficulty. Psychiatric this patient is able to make decisions and demonstrates good insight into disease process. Alert and Oriented x 3. pleasant and cooperative. Notes Upon inspection patient's wound bed actually showed signs of good granulation at this time. Fortunately there is no evidence of active infection which is great news and overall very pleased in that regard. Nonetheless I am concerned about the fact that we still need to get this healed there is bone exposed we need to try to get new tissue coverage over this is much and quickly as possible. First we need to get this cleaned that is where the Santyl comes in the play. Electronic  Signature(s) Signed: 11/04/2019  2:40:08 PM By: Worthy Keeler PA-C Entered By: Worthy Keeler on 11/04/2019 14:40:07 [name] ([id number]) -------------------------------------------------------------------------------- Physician Orders Details Patient Name: [name] Date of Service: 11/04/2019 10:00 AM Medical Record Number: [id number] Patient Account Number: [id number] Date of Birth/Sex: 16-Feb-1930 (86 y.o. F) Treating RN: Cornell Barman Primary Care Provider: Ria Bush Other Clinician: Referring Provider: Ria Bush Treating Provider/Extender: Melburn Hake, Vinnie Bobst Weeks in Treatment: 3 Verbal / Phone Orders: No Diagnosis Coding ICD-10 Coding Code Description I73.89 Other specified peripheral vascular diseases L97.512 Non-pressure chronic ulcer of other part of right foot with fat layer exposed Z79.01 Long term (current) use of anticoagulants I48.0 Paroxysmal atrial fibrillation Wound Cleansing Wound #3 Right Metatarsal head fifth o Clean wound with Normal Saline. - With each dressing change o Other: - wound cleaner can be used as well. Primary Wound Dressing Wound #3 Right Metatarsal head fifth o Santyl Ointment Secondary Dressing Wound #3 Right Metatarsal head fifth o Boardered Foam Dressing Dressing Change Frequency Wound #3 Right Metatarsal head fifth o Change dressing every day. Follow-up Appointments Wound #3 Right Metatarsal head fifth o Return Appointment in 2 weeks. Edema Control Wound #3 Right Metatarsal head fifth o Elevate legs to the level of the heart and pump ankles as often as possible Off-Loading Wound #3 Right Metatarsal head fifth o Other: - Keep pressure off of the wound Additional Orders / Instructions Wound #3 Right Metatarsal head fifth o Increase protein intake. Medications-please add to medication list. Wound #3 Right Metatarsal head fifth o Santyl Enzymatic Ointment Patient Medications Allergies:  penicillin LEONDA, CRISTO ([id number]) Notifications Medication Indication Start End Santyl DOSE topical 250 unit/gram ointment - ointment topical Electronic Signature(s) Signed: 11/04/2019 5:01:28 PM By: Worthy Keeler PA-C Signed: 11/04/2019 6:25:55 PM By: Gretta Cool, BSN, RN, CWS, Kim RN, BSN Entered By: Gretta Cool, BSN, RN, CWS, Kim on 11/04/2019 10:51:56 MIKYLA, SCHACHTER ([id number]) -------------------------------------------------------------------------------- Prescription 11/04/2019 Patient Name: [name]. Provider: Worthy Keeler PA-C Date of Birth: 10/29/1929 NPI#: [id number] Sex: F DEA#: [id number] Phone #: [phone] License #: Patient Address: [facility] [address], [address], [place] [address] [address], [place] [id number] Allergies penicillin Medication Medication: Route: Strength: Form: Santyl 250 unit/gram topical ointment topical 250 unit/gram ointment Class: TOPICAL/MUCOUS MEMBR./SUBCUT. ENZYMES Dose: Frequency / Time: Indication: ointment topical Number of Refills: Number of Units: 0 Generic Substitution: Start Date: End Date: One Time Use: Substitution Permitted No Note to Pharmacy: Hand Signature: Date(s): Electronic Signature(s) Signed: 11/04/2019 5:01:28 PM By: Worthy Keeler PA-C Signed: 11/04/2019 6:25:55 PM By: Gretta Cool, BSN, RN, CWS, Kim RN, BSN Entered By: Gretta Cool, BSN, RN, CWS, Kim on 11/04/2019 10:51:56 [name] ([id number]) --------------------------------------------------------------------------------  Problem List Details Patient Name: LASHELL, MOFFITT. Date of Service: 11/04/2019 10:00 AM Medical Record Number: [id number] Patient Account Number: [id number] Date of Birth/Sex: 1929/11/28 (86 y.o. F) Treating RN: Cornell Barman Primary Care Provider: Ria Bush Other Clinician: Referring Provider:  Ria Bush Treating Provider/Extender: Melburn Hake, Keirstin Musil Weeks in Treatment: 3 Active Problems ICD-10 Encounter Code Description Active Date MDM Diagnosis I73.89 Other specified peripheral vascular diseases 10/12/2019 No Yes L97.512 Non-pressure chronic ulcer of other part of right foot with fat layer 10/12/2019 No Yes exposed Z79.01 Long term (current) use of anticoagulants 10/12/2019 No Yes I48.0 Paroxysmal atrial fibrillation 10/12/2019 No Yes Inactive Problems Resolved Problems Electronic Signature(s) Signed: 11/04/2019 10:03:36 AM By: Worthy Keeler PA-C Entered By:  Worthy Keeler on 11/04/2019 10:03:35 TAI, SYFERT ([id number]) -------------------------------------------------------------------------------- Progress Note Details Patient Name: JACKY, HARTUNG. Date of Service: 11/04/2019 10:00 AM Medical Record Number: [id number] Patient Account Number: [id number] Date of Birth/Sex: [birth year] (86 y.o. F) Treating RN: Cornell Barman Primary Care Provider: Ria Bush Other Clinician: Referring Provider: Ria Bush Treating Provider/Extender: Melburn Hake, Becker Christopher Weeks in Treatment: 3 Subjective Chief Complaint Information obtained from Patient Right Lateral Foot Ulcer History of Present Illness (HPI) 10/12/2019 upon evaluation today patient appears to be doing somewhat poorly in regard to her wound at this point. She has a ulcer over the right fifth metatarsal region. This has been managed by podiatry up to this point. Unfortunately the patient just does not seem to be making the progress that they were hoping for an subsequently the patient was referred to [place] by [facility] Dr. Posey Pronto. With that being said I did note that the patient does have peripheral vascular disease which has been managed by Dr. Lazaro Arms her most recent angiogram with intervention was on 08/02/2019. The ABIs following were on the right 1.30 with a TBI of 0.61 normal left 1.31 with a TBI of  0.58. Obviously things seem to be doing somewhat better at this point hopefully enough to allow her to heal. She is on anticoagulant therapy due to atrial fibrillation long-term. With that being said the patient is unfortunately having quite a bit of discomfort at this time secondary to the wound. She also has erythema which is very likely secondary to infection. I am going obtain a culture of the area today once debridement is complete. Apparently the patient also had a x-ray that was performed by Dr. Posey Pronto on 09/21/2019 and this showed that the patient had no obvious signs on x-ray of cortical irregularity and no osteomyelitis obviously noted. With that being said as I discussed with the patient's daughter as well this does not indicate that the patient absolutely has no osteomyelitis as x-rays are only at best 50% helpful in identifying this complication. Nonetheless I do believe that we need to have the patient continue to have this further evaluated specifically I am to recommend that we check into an MRI which I think would be ideal to evaluate whether or not there is osteomyelitis present. 7/16; this is a patient with an ulcer over the right fifth metatarsal head. She [facility] with [facility]. She is due to have an MRI of the foot on 7/26. Using Santyl to the wound 11/04/2019 on evaluation today patient appears to be doing okay in regard to her wound at this time. Unfortunately since I last saw her which was actually her second visit here in the clinic she has moved into a new apartment and then subsequently had a fall. She ended up in the hospital she did fracture her left arm and subsequently has also been dealing with a lot of dizziness she has been at [employer]. She tells me that getting in the change the dressing has been somewhat difficult as far as daily dressing changes are concerned with the Santyl. Nonetheless when they do change it that is what is been used  and her daughter-in-law has also been coming in to help out as well with the dressing changes to try to make sure it gets done daily. Fortunately there is no signs of active infection at this time. No fever chills noted. I do believe the Santyl has been beneficial and is likely still  the best way to go at this point for the patient. I did review the patient's MRI which showed some potential for reactive osteitis but did not show any signs of osteomyelitis overtly. That cannot be excluded but again right now it does not appear that is very likely present as well. This is good news. I also did review her culture she has been on the Bactrim she tells me she has taken this and again that was appropriate based on the results of her culture. Objective Constitutional Well-nourished and well-hydrated in no acute distress. Vitals Time Taken: 10:15 AM, Height: 62 in, Weight: 140 lbs, BMI: 25.6, Temperature: 98.0 F, Pulse: 64 bpm, Respiratory Rate: 16 breaths/min, Blood Pressure: 171/63 mmHg. Respiratory normal breathing without difficulty. Psychiatric this patient is able to make decisions and demonstrates good insight into disease process. Alert and Oriented x 3. pleasant and cooperative. General Notes: Upon inspection patient's wound bed actually showed signs of good granulation at this time. Fortunately there is no evidence of active infection which is great news and overall very pleased in that regard. Nonetheless I am concerned about the fact that we still need to get this healed there is bone exposed we need to try to get new tissue coverage over this is much and quickly as possible. First we need to get this cleaned that is where the Santyl comes in the play. CHAITRA, MAST ([id number]) Integumentary (Hair, Skin) Wound #3 status is Open. Original cause of wound was Gradually Appeared. The wound is located on the Right Metatarsal head fifth. The wound measures 0.9cm length x 0.9cm width x 0.3cm  depth; 0.636cm^2 area and 0.191cm^3 volume. There is Fat Layer (Subcutaneous Tissue) Exposed exposed. There is a medium amount of serosanguineous drainage noted. The wound margin is distinct with the outline attached to the wound base. There is small (1-33%) red granulation within the wound bed. There is a large (67-100%) amount of necrotic tissue within the wound bed including Adherent Slough. Assessment Active Problems ICD-10 Other specified peripheral vascular diseases Non-pressure chronic ulcer of other part of right foot with fat layer exposed Long term (current) use of anticoagulants Paroxysmal atrial fibrillation Procedures Wound #3 Pre-procedure diagnosis of Wound #3 is an Arterial Insufficiency Ulcer located on the Right Metatarsal head fifth .Severity of Tissue Pre Debridement is: Fat layer exposed. There was a Excisional Skin/Subcutaneous Tissue Debridement with a total area of 0.81 sq cm performed by STONE III, Radley Barto E., PA-C. With the following instrument(s): Curette, tongue blade Material removed includes Subcutaneous Tissue and Slough and. A time out was conducted at 10:48, prior to the start of the procedure. There was no bleeding. The procedure was tolerated well. Post Debridement Measurements: 0.9cm length x 0.9cm width x 0.3cm depth; 0.191cm^3 volume. Character of Wound/Ulcer Post Debridement is stable. Severity of Tissue Post Debridement is: Fat layer exposed. Post procedure Diagnosis Wound #3: Same as Pre-Procedure Plan Wound Cleansing: Wound #3 Right Metatarsal head fifth: Clean wound with Normal Saline. - With each dressing change Other: - wound cleaner can be used as well. Primary Wound Dressing: Wound #3 Right Metatarsal head fifth: Santyl Ointment Secondary Dressing: Wound #3 Right Metatarsal head fifth: Boardered Foam Dressing Dressing Change Frequency: Wound #3 Right Metatarsal head fifth: Change dressing every day. Follow-up Appointments: Wound #3 Right  Metatarsal head fifth: Return Appointment in 2 weeks. Edema Control: Wound #3 Right Metatarsal head fifth: Elevate legs to the level of the heart and pump ankles as often as possible Off-Loading: Wound #3  Right Metatarsal head fifth: Other: - Keep pressure off of the wound Additional Orders / Instructions: Wound #3 Right Metatarsal head fifth: Increase protein intake. Medications-please add to medication list.: Wound #3 Right Metatarsal head fifth: Santyl Enzymatic Ointment The following medication(s) was prescribed: Santyl topical 250 unit/gram ointment ointment topical Coll, Annina S. ([id number]) 1. I would recommend currently that we going to continue with the Santyl this does need to be changed daily I would recommend a border foam dressing to cover to help protect and keep the moisture and we do not want anything drying out. 2. I am also can recommend at this time that we continue with the appropriate offloading she needs to avoid ambulation in regard to the wound as far as making anything worse at this point she is really not ambulating too much other than physical therapy I am okay with the physical therapy working with her I see no issues in that regard. 3. I am also going to suggest that the facility needs to change the dressings absolutely every day. If that is not happening this can definitely go downhill and worsen. We will see patient back for reevaluation in 2 weeks here in the clinic. If anything worsens or changes patient will contact our office for additional recommendations. Electronic Signature(s) Signed: 11/04/2019 2:42:31 PM By: Worthy Keeler PA-C Entered By: Worthy Keeler on 11/04/2019 14:42:30 [name] ([id number]) -------------------------------------------------------------------------------- SuperBill Details Patient Name: [name] Date of Service: 11/04/2019 Medical Record Number: [id number] Patient Account Number: [id number] Date of  Birth/Sex: May 06, 1929 (86 y.o. F) Treating RN: Cornell Barman Primary Care Provider: Ria Bush Other Clinician: Referring Provider: Ria Bush Treating Provider/Extender: Melburn Hake, Tarin Navarez Weeks in Treatment: 3 Diagnosis Coding ICD-10 Codes Code Description I73.89 Other specified peripheral vascular diseases L97.512 Non-pressure chronic ulcer of other part of right foot with fat layer exposed Z79.01 Long term (current) use of anticoagulants I48.0 Paroxysmal atrial fibrillation Facility Procedures CPT4 Code: [id number] Description: 11042 - DEB SUBQ TISSUE 20 SQ CM/< Modifier: Quantity: 1 CPT4 Code: Description: ICD-10 Diagnosis Description L97.512 Non-pressure chronic ulcer of other part of right foot with fat layer ex Modifier: posed Quantity: Physician Procedures CPT4 Code: 6195093 Description: 11042 - WC PHYS SUBQ TISS 20 SQ CM Modifier: Quantity: 1 CPT4 Code: Description: ICD-10 Diagnosis Description L97.512 Non-pressure chronic ulcer of other part of right foot with fat layer ex Modifier: posed Quantity: Electronic Signature(s) Signed: 11/04/2019 2:42:52 PM By: Worthy Keeler PA-C Entered By: Worthy Keeler on 11/04/2019 14:42:52

## 2019-11-04 NOTE — Progress Notes (Addendum)
[name], [name] ([id number]) Visit Report for 11/04/2019 Arrival Information Details Patient Name: [name], [name]. Date of Service: 11/04/2019 10:00 AM Medical Record Number: [id number] Patient Account Number: [id number] Date of Birth/Sex: 26-Jul-1929 (86 y.o. F) Treating RN: Cornell Barman Primary Care Alyra Patty: Ria Bush Other Clinician: Referring Ajia Chadderdon: Ria Bush Treating Tanairi Cypert/Extender: Melburn Hake, HOYT Weeks in Treatment: 3 Visit Information History Since Last Visit Had a fall or experienced change in Yes Patient Arrived: Ambulatory activities of daily living that may affect Arrival Time: 10:14 risk of falls: Accompanied By: self Hospitalized since last visit: Yes Transfer Assistance: None Pain Present Now: No Patient Identification Verified: Yes Secondary Verification Process Completed: Yes Patient Has Alerts: Yes Patient Alerts: 08/31/19 ABI R)1.3 L)1.31 08/31/19 TBI R).61 L).58 Electronic Signature(s) Signed: 11/04/2019 6:25:55 PM By: Gretta Cool, BSN, RN, CWS, Kim RN, BSN Entered By: Gretta Cool, BSN, RN, CWS, Kim on 11/04/2019 10:16:28 [name] ([id number]) -------------------------------------------------------------------------------- Encounter Discharge Information Details Patient Name: [name], [name]. Date of Service: 11/04/2019 10:00 AM Medical Record Number: [id number] Patient Account Number: [id number] Date of Birth/Sex: [birth year] (86 y.o. F) Treating RN: Cornell Barman Primary Care Theo Krumholz: Ria Bush Other Clinician: Referring Matsuko Kretz: Ria Bush Treating Sylvio Weatherall/Extender: Melburn Hake, HOYT Weeks in Treatment: 3 Encounter Discharge Information Items Post Procedure Vitals Discharge Condition: Stable Temperature (F): 98 Ambulatory Status: Ambulatory Pulse (bpm): 64 Discharge Destination: [place] Respiratory Rate (breaths/min): 16 Telephoned: No Blood Pressure (mmHg): 171/63 Orders Sent: Yes Transportation:  Private Auto Accompanied By: daughter Schedule Follow-up Appointment: No Clinical Summary of Care: Electronic Signature(s) Signed: 11/04/2019 6:25:55 PM By: Gretta Cool, BSN, RN, CWS, Kim RN, BSN Entered By: Gretta Cool, BSN, RN, CWS, Kim on 11/04/2019 10:51:17 [name] ([id number]) -------------------------------------------------------------------------------- Lower Extremity Assessment Details Patient Name: [name], [name]. Date of Service: 11/04/2019 10:00 AM Medical Record Number: [id number] Patient Account Number: [id number] Date of Birth/Sex: 1929/08/20 (86 y.o. F) Treating RN: Montey Hora Primary Care Treyce Spillers: Ria Bush Other Clinician: Referring Geneen Dieter: Ria Bush Treating Adin Lariccia/Extender: STONE III, HOYT Weeks in Treatment: 3 Edema Assessment Assessed: [Left: No] [Right: Yes] Edema: [Left: Ye] [Right: s] Vascular Assessment Pulses: Dorsalis Pedis Palpable: [Right:Yes] Posterior Tibial Palpable: [Right:Yes] Electronic Signature(s) Signed: 11/08/2019 6:49:35 PM By: Montey Hora Entered By: Montey Hora on 11/04/2019 10:30:08 [name] ([id number]) -------------------------------------------------------------------------------- Multi Wound Chart Details Patient Name: [name]. Date of Service: 11/04/2019 10:00 AM Medical Record Number: [id number] Patient Account Number: [id number] Date of Birth/Sex: [birth year] (86 y.o. F) Treating RN: Cornell Barman Primary Care Khiana Camino: Ria Bush Other Clinician: Referring Zenon Leaf: Ria Bush Treating Landyn Lorincz/Extender: Melburn Hake, HOYT Weeks in Treatment: 3 Vital Signs Height(in): 62 Pulse(bpm): 90 Weight(lbs): 140 Blood Pressure(mmHg): 171/63 Body Mass Index(BMI): 26 Temperature(F): 98.0 Respiratory Rate(breaths/min): 16 Photos: [N/A:N/A] Wound Location: Right Metatarsal head fifth N/A N/A Wounding Event: Gradually Appeared N/A N/A Primary Etiology: Arterial Insufficiency  Ulcer N/A N/A Comorbid History: Arrhythmia, Osteoarthritis N/A N/A Date Acquired: 04/09/2019 N/A N/A Weeks of Treatment: 3 N/A N/A Wound Status: Open N/A N/A Measurements L x W x D (cm) 0.9x0.9x0.3 N/A N/A Area (cm) : 0.636 N/A N/A Volume (cm) : 0.191 N/A N/A % Reduction in Area: 43.80% N/A N/A % Reduction in Volume: -69.00% N/A N/A Classification: Full Thickness Without Exposed N/A N/A Support Structures Exudate Amount: Medium N/A N/A Exudate Type: Serosanguineous N/A N/A Exudate Color: red, brown N/A N/A Wound Margin: Distinct, outline attached N/A N/A Granulation Amount: Small (1-33%) N/A N/A Granulation Quality: Red N/A N/A Necrotic Amount: Large (67-100%) N/A N/A Exposed Structures: Fat Layer (  Subcutaneous Tissue) N/A N/A Exposed: Yes Fascia: No Tendon: No Muscle: No Joint: No Bone: No Epithelialization: None N/A N/A Treatment Notes Electronic Signature(s) Signed: 11/04/2019 6:25:55 PM By: Gretta Cool, BSN, RN, CWS, Kim RN, BSN Entered By: Gretta Cool, BSN, RN, CWS, Kim on 11/04/2019 10:41:12 [name], [name] ([id number]) -------------------------------------------------------------------------------- [place] Details Patient Name: [name], [name]. Date of Service: 11/04/2019 10:00 AM Medical Record Number: [id number] Patient Account Number: [id number] Date of Birth/Sex: 01-16-1930 (86 y.o. F) Treating RN: Cornell Barman Primary Care Vidit Boissonneault: Ria Bush Other Clinician: Referring Xsavier Seeley: Ria Bush Treating Elijahjames Fuelling/Extender: Melburn Hake, HOYT Weeks in Treatment: 3 Active Inactive Abuse / Safety / Falls / Self Care Management Nursing Diagnoses: Potential for falls Goals: Patient/caregiver will verbalize understanding of skin care regimen Date Initiated: 10/12/2019 Target Resolution Date: 11/09/2019 Goal Status: Active Interventions: Assess fall risk on admission and as needed Notes: Necrotic Tissue Nursing Diagnoses: Impaired tissue integrity  related to necrotic/devitalized tissue Goals: Necrotic/devitalized tissue will be minimized in the wound bed Date Initiated: 10/12/2019 Target Resolution Date: 11/09/2019 Goal Status: Active Interventions: Assess patient pain level pre-, during and post procedure and prior to discharge Treatment Activities: Enzymatic debridement : 10/12/2019 Notes: Orientation to the Wound Care Program Nursing Diagnoses: Knowledge deficit related to the wound healing center program Goals: Patient/caregiver will verbalize understanding of the [place] [facility] Date Initiated: 10/12/2019 Target Resolution Date: 11/09/2019 Goal Status: Active Interventions: Provide education on orientation to the wound center Notes: Pain, Acute or Chronic Nursing Diagnoses: Potential alteration in comfort, pain Goals: Patient will verbalize adequate pain control and receive pain control interventions during procedures as needed [name], [name] ([id number]) Date Initiated: 10/12/2019 Target Resolution Date: 11/09/2019 Goal Status: Active Interventions: Complete pain assessment as per visit requirements Treatment Activities: Administer pain control measures as ordered : 10/12/2019 Notes: Peripheral Neuropathy Nursing Diagnoses: Knowledge deficit related to disease process and management of peripheral neurovascular dysfunction Potential alteration in peripheral tissue perfusion (select prior to confirmation of diagnosis) Goals: Patient/caregiver will verbalize understanding of disease process and disease management Date Initiated: 10/12/2019 Target Resolution Date: 11/09/2019 Goal Status: Active Interventions: Assess signs and symptoms of neuropathy upon admission and as needed Provide education on Management of Neuropathy and Related Ulcers Notes: Wound/Skin Impairment Nursing Diagnoses: Impaired tissue integrity Goals: Ulcer/skin breakdown will have a volume reduction of 30% by week 4 Date Initiated:  10/12/2019 Target Resolution Date: 11/09/2019 Goal Status: Active Interventions: Assess ulceration(s) every visit Treatment Activities: Skin care regimen initiated : 10/12/2019 Topical wound management initiated : 10/12/2019 Notes: Electronic Signature(s) Signed: 11/04/2019 6:25:55 PM By: Gretta Cool, BSN, RN, CWS, Kim RN, BSN Entered By: Gretta Cool, BSN, RN, CWS, Kim on 11/04/2019 10:41:04 [name] ([id number]) -------------------------------------------------------------------------------- Pain Assessment Details Patient Name: [name], [name]. Date of Service: 11/04/2019 10:00 AM Medical Record Number: [id number] Patient Account Number: [id number] Date of Birth/Sex: Nov 08, 1929 (86 y.o. F) Treating RN: Cornell Barman Primary Care Connie Hilgert: Ria Bush Other Clinician: Referring Renesmae Donahey: Ria Bush Treating Nickol Collister/Extender: Melburn Hake, HOYT Weeks in Treatment: 3 Active Problems Location of Pain Severity and Description of Pain Patient Has Paino Yes Site Locations Pain Location: Generalized Pain Pain Management and Medication Current Pain Management: Notes mostly in arm Electronic Signature(s) Signed: 11/04/2019 6:25:55 PM By: Gretta Cool, BSN, RN, CWS, Kim RN, BSN Entered By: Gretta Cool, BSN, RN, CWS, Kim on 11/04/2019 10:16:18 [name] ([id number]) -------------------------------------------------------------------------------- Patient/Caregiver Education Details Patient Name: [name] Date of Service: 11/04/2019 10:00 AM Medical Record Number: [id number] Patient Account Number: [id number] Date of Birth/Gender:  11-07-1929 (86 y.o. F) Treating RN: Cornell Barman Primary Care Physician: Ria Bush Other Clinician: Referring Physician: Ria Bush Treating Physician/Extender: Sharalyn Ink in Treatment: 3 Education Assessment Education Provided To: Patient Education Topics Provided Peripheral Neuropathy: Handouts: Neuropathy Wound/Skin  Impairment: Handouts: Caring for Your Ulcer, Other: wound care as prescribed Methods: Demonstration, Explain/Verbal Responses: State content correctly Electronic Signature(s) Signed: 11/04/2019 6:25:55 PM By: Gretta Cool, BSN, RN, CWS, Kim RN, BSN Entered By: Gretta Cool, BSN, RN, CWS, Kim on 11/04/2019 10:50:21 [name] ([id number]) -------------------------------------------------------------------------------- Wound Assessment Details Patient Name: [name], [name]. Date of Service: 11/04/2019 10:00 AM Medical Record Number: [id number] Patient Account Number: [id number] Date of Birth/Sex: 02/06/1930 (86 y.o. F) Treating RN: Montey Hora Primary Care Kuron Docken: Ria Bush Other Clinician: Referring Leilanee Righetti: Ria Bush Treating Sanjana Folz/Extender: Melburn Hake, HOYT Weeks in Treatment: 3 Wound Status Wound Number: 3 Primary Etiology: Arterial Insufficiency Ulcer Wound Location: Right Metatarsal head fifth Wound Status: Open Wounding Event: Gradually Appeared Comorbid History: Arrhythmia, Osteoarthritis Date Acquired: 04/09/2019 Weeks Of Treatment: 3 Clustered Wound: No Photos Wound Measurements Length: (cm) 0.9 Width: (cm) 0.9 Depth: (cm) 0.3 Area: (cm) 0.636 Volume: (cm) 0.191 % Reduction in Area: 43.8% % Reduction in Volume: -69% Epithelialization: None Wound Description Classification: Full Thickness Without Exposed Support Structures Wound Margin: Distinct, outline attached Exudate Amount: Medium Exudate Type: Serosanguineous Exudate Color: red, brown Foul Odor After Cleansing: No Slough/Fibrino Yes Wound Bed Granulation Amount: Small (1-33%) Exposed Structure Granulation Quality: Red Fascia Exposed: No Necrotic Amount: Large (67-100%) Fat Layer (Subcutaneous Tissue) Exposed: Yes Necrotic Quality: Adherent Slough Tendon Exposed: No Muscle Exposed: No Joint Exposed: No Bone Exposed: No Treatment Notes Wound #3 (Right Metatarsal head  fifth) Notes santyl, saline gauze, coverlet Electronic Signature(s) Signed: 11/08/2019 6:49:35 PM By: Julious Oka, Joline Salt ([id number]) Entered By: Montey Hora on 11/04/2019 10:29:23 [name] ([id number]) -------------------------------------------------------------------------------- Vitals Details Patient Name: [name]. Date of Service: 11/04/2019 10:00 AM Medical Record Number: [id number] Patient Account Number: [id number] Date of Birth/Sex: 1930/03/18 (86 y.o. F) Treating RN: Cornell Barman Primary Care Denyla Cortese: Ria Bush Other Clinician: Referring Renley Banwart: Ria Bush Treating Patryce Depriest/Extender: Melburn Hake, HOYT Weeks in Treatment: 3 Vital Signs Time Taken: 10:15 Temperature (F): 98.0 Height (in): 62 Pulse (bpm): 64 Weight (lbs): 140 Respiratory Rate (breaths/min): 16 Body Mass Index (BMI): 25.6 Blood Pressure (mmHg): 171/63 Reference Range: 80 - 120 mg / dl Electronic Signature(s) Signed: 11/04/2019 6:25:55 PM By: Gretta Cool, BSN, RN, CWS, Kim RN, BSN Entered By: Gretta Cool, BSN, RN, CWS, Kim on 11/04/2019 10:16:02

## 2019-11-05 NOTE — Progress Notes (Signed)
CARMON, BRIGANDI ([id number]) Visit Report for 11/04/2019 Fall Risk Assessment Details Patient Name: [name], [name]. Date of Service: 11/04/2019 10:00 AM Medical Record Number: [id number] Patient Account Number: [id number] Date of Birth/Sex: 03/03/1930 (86 y.o. F) Treating RN: Cornell Barman Primary Care Aretta Stetzel: Ria Bush Other Clinician: Referring Jaisean Monteforte: Ria Bush Treating Leiah Giannotti/Extender: Melburn Hake, HOYT Weeks in Treatment: 3 Fall Risk Assessment Items Have you had 2 or more falls in the last 12 monthso 0 Yes Have you had any fall that resulted in injury in the last 12 monthso 0 Yes FALLS RISK SCREEN History of falling - immediate or within 3 months 25 Yes Secondary diagnosis (Do you have 2 or more medical diagnoseso) 0 No Ambulatory aid None/bed rest/wheelchair/nurse 0 No Crutches/cane/walker 15 Yes Furniture 0 No Intravenous therapy Access/Saline/Heparin Lock 0 No Gait/Transferring Normal/ bed rest/ wheelchair 0 No Weak (short steps with or without shuffle, stooped but able to lift head while walking, may 10 Yes seek support from furniture) Impaired (short steps with shuffle, may have difficulty arising from chair, head down, impaired 0 No balance) Mental Status Oriented to own ability 0 Yes Electronic Signature(s) Signed: 11/04/2019 6:25:55 PM By: Gretta Cool, BSN, RN, CWS, Kim RN, BSN Entered By: Gretta Cool, BSN, RN, CWS, Kim on 11/04/2019 10:17:03

## 2019-11-08 NOTE — Telephone Encounter (Signed)
Pt has been in ED with a broken wrist. She did not make it in for her labs. Advised pt's daughter, [name], that pt should have her f/u OV with PCP and discuss if pt should still have prolia and if so she could have labs at [place] on 8/10 and then have inj at [place] at a later date. [name] verbalized understanding.

## 2019-11-16 ENCOUNTER — Ambulatory Visit (INDEPENDENT_AMBULATORY_CARE_PROVIDER_SITE_OTHER): Payer: Medicare Other | Admitting: Family Medicine

## 2019-11-16 ENCOUNTER — Ambulatory Visit: Payer: Medicare Other

## 2019-11-16 ENCOUNTER — Other Ambulatory Visit: Payer: Self-pay

## 2019-11-16 ENCOUNTER — Encounter: Payer: Self-pay | Admitting: Family Medicine

## 2019-11-16 VITALS — BP 127/68 | HR 124 | Temp 97.6°F | Ht 62.0 in | Wt 136.0 lb

## 2019-11-16 DIAGNOSIS — Z7982 Long term (current) use of aspirin: Secondary | ICD-10-CM | POA: Diagnosis not present

## 2019-11-16 DIAGNOSIS — I130 Hypertensive heart and chronic kidney disease with heart failure and stage 1 through stage 4 chronic kidney disease, or unspecified chronic kidney disease: Secondary | ICD-10-CM | POA: Diagnosis not present

## 2019-11-16 DIAGNOSIS — I5032 Chronic diastolic (congestive) heart failure: Secondary | ICD-10-CM | POA: Diagnosis not present

## 2019-11-16 DIAGNOSIS — L97513 Non-pressure chronic ulcer of other part of right foot with necrosis of muscle: Secondary | ICD-10-CM | POA: Diagnosis not present

## 2019-11-16 DIAGNOSIS — S62101S Fracture of unspecified carpal bone, right wrist, sequela: Secondary | ICD-10-CM

## 2019-11-16 DIAGNOSIS — L97519 Non-pressure chronic ulcer of other part of right foot with unspecified severity: Secondary | ICD-10-CM

## 2019-11-16 DIAGNOSIS — E538 Deficiency of other specified B group vitamins: Secondary | ICD-10-CM | POA: Diagnosis not present

## 2019-11-16 DIAGNOSIS — G6289 Other specified polyneuropathies: Secondary | ICD-10-CM

## 2019-11-16 DIAGNOSIS — I2510 Atherosclerotic heart disease of native coronary artery without angina pectoris: Secondary | ICD-10-CM | POA: Diagnosis not present

## 2019-11-16 DIAGNOSIS — W19XXXS Unspecified fall, sequela: Secondary | ICD-10-CM

## 2019-11-16 DIAGNOSIS — Z7952 Long term (current) use of systemic steroids: Secondary | ICD-10-CM | POA: Diagnosis not present

## 2019-11-16 DIAGNOSIS — I4891 Unspecified atrial fibrillation: Secondary | ICD-10-CM | POA: Diagnosis not present

## 2019-11-16 DIAGNOSIS — Z7901 Long term (current) use of anticoagulants: Secondary | ICD-10-CM | POA: Diagnosis not present

## 2019-11-16 DIAGNOSIS — M810 Age-related osteoporosis without current pathological fracture: Secondary | ICD-10-CM | POA: Diagnosis not present

## 2019-11-16 DIAGNOSIS — I739 Peripheral vascular disease, unspecified: Secondary | ICD-10-CM | POA: Diagnosis not present

## 2019-11-16 DIAGNOSIS — I272 Pulmonary hypertension, unspecified: Secondary | ICD-10-CM | POA: Diagnosis not present

## 2019-11-16 DIAGNOSIS — S62102D Fracture of unspecified carpal bone, left wrist, subsequent encounter for fracture with routine healing: Secondary | ICD-10-CM | POA: Diagnosis not present

## 2019-11-16 DIAGNOSIS — N1831 Chronic kidney disease, stage 3a: Secondary | ICD-10-CM

## 2019-11-16 DIAGNOSIS — E7849 Other hyperlipidemia: Secondary | ICD-10-CM | POA: Diagnosis not present

## 2019-11-16 DIAGNOSIS — E559 Vitamin D deficiency, unspecified: Secondary | ICD-10-CM | POA: Diagnosis not present

## 2019-11-16 DIAGNOSIS — M5136 Other intervertebral disc degeneration, lumbar region: Secondary | ICD-10-CM | POA: Diagnosis not present

## 2019-11-16 DIAGNOSIS — G2581 Restless legs syndrome: Secondary | ICD-10-CM | POA: Diagnosis not present

## 2019-11-16 DIAGNOSIS — I10 Essential (primary) hypertension: Secondary | ICD-10-CM

## 2019-11-16 DIAGNOSIS — I872 Venous insufficiency (chronic) (peripheral): Secondary | ICD-10-CM | POA: Diagnosis not present

## 2019-11-16 DIAGNOSIS — N178 Other acute kidney failure: Secondary | ICD-10-CM

## 2019-11-16 DIAGNOSIS — Z9181 History of falling: Secondary | ICD-10-CM | POA: Diagnosis not present

## 2019-11-16 DIAGNOSIS — K219 Gastro-esophageal reflux disease without esophagitis: Secondary | ICD-10-CM | POA: Diagnosis not present

## 2019-11-16 DIAGNOSIS — M1990 Unspecified osteoarthritis, unspecified site: Secondary | ICD-10-CM | POA: Diagnosis not present

## 2019-11-16 DIAGNOSIS — G9009 Other idiopathic peripheral autonomic neuropathy: Secondary | ICD-10-CM | POA: Diagnosis not present

## 2019-11-16 DIAGNOSIS — N3941 Urge incontinence: Secondary | ICD-10-CM

## 2019-11-16 MED ORDER — CYANOCOBALAMIN 1000 MCG/ML IJ SOLN
1000.0000 ug | Freq: Once | INTRAMUSCULAR | Status: AC
  Administered 2019-11-16: 1000 ug via INTRAMUSCULAR

## 2019-11-16 NOTE — Progress Notes (Addendum)
This visit was conducted in person.  BP 127/68   Pulse (!) 124   Temp 97.6 F (36.4 C)   Ht 5\' 2"  (1.575 m)   Wt 136 lb (61.7 kg)   SpO2 96%   BMI 24.87 kg/m    CC: follow up visit  Subjective:    Patient ID: [name], female    DOB: 22-Feb-1930, 86 y.o.   MRN: [id number]  HPI: [name] is a 86 y.o. female presenting on 11/16/2019 for Follow-up (pt have questions regarding medication) and [facility] with daughter [name].    Recent hospitalization after fall found to have L wrist fracture, with ARF that improved prior to discharge. She was on bactrim during this time and didn't have prolonged period down after fall. She was not wearing life alert when fall happened. Discharged to [facility] on 7/21 with planned PT, came home on 11/14/2019.   Saw [facility] 7/23, note reviewed - rec continue non-surgical treatment with short arm cast.   Known severe PAD followed by [facility]. Continues seeing wound clinic for chronic R foot ulcer.   OP - h/o compression fractures. Now on prolia - next shot due. Will check labs today and then schedule.   Vit B12 def - did not receive b12 shot recently.   Miralax and flomax were started - some urinary retention. Since home, off flomax and voiding well.  Lisinopril, metoprolol, furosemide were stopped. Labs today to determine need for these meds.  Tachycardiac.  [facility] involved - PT, OT, nurse aide.    Admit date: 10/23/2019 Discharge date: 10/27/2019 TCM hosp f/u phone call not completed but seen within 2 days of discharge from SNF.   Admitted From: Home Disposition:  health  Discharge home on 11/14/2019.   Recommendations for Outpatient Follow-up:  1. Follow up with PCP in 1 week 2. Please obtain BMP/CBC in one week 3. Follow-up with orthopedics Dr. Roland Rack in 1 week for repeat reevaluation and x-ray  Discharge Condition:Stable CODE STATUS: DNR Diet recommendation: Heart Healthy   Discharge Diagnoses:    Active Problems:   Essential hypertension   Fall   Acute renal failure superimposed on stage 3a chronic kidney disease (HCC)   Left wrist fracture   Acute hypoxemic respiratory failure (HCC)   Acute on chronic diastolic CHF (congestive heart failure) (HCC)   At risk for inadequate pain control   Closed fracture of right wrist --> ?error   Goals of care, counseling/discussion   Palliative care by specialist     Relevant past medical, surgical, family and social history reviewed and updated as indicated. Interim medical history since our last visit reviewed. Allergies and medications reviewed and updated. Outpatient Medications Prior to Visit  Medication Sig Dispense Refill  . acetaminophen (TYLENOL) 500 MG tablet Take 1,000 mg every 8 (eight) hours as needed by mouth for moderate pain.     Marland Kitchen acyclovir (ZOVIRAX) 400 MG tablet Take 400 mg 2 (two) times daily by mouth.     Marland Kitchen amiodarone (PACERONE) 100 MG tablet Take 1 tablet (100 mg total) by mouth daily.    Marland Kitchen aspirin EC 81 MG tablet Take 1 tablet (81 mg total) by mouth daily. 150 tablet 2  . atorvastatin (LIPITOR) 20 MG tablet TAKE 1 TABLET BY MOUTH ONCE DAILY (Patient taking differently: Take 20 mg by mouth daily. ) 90 tablet 1  . collagenase (SANTYL) ointment Apply 1 application topically daily. 15 g 0  . DULoxetine (CYMBALTA) 60 MG capsule  TAKE 1 CAPSULE BY MOUTH ONCE DAILY (Patient taking differently: Take 60 mg by mouth daily. ) 90 capsule 1  . ELIQUIS 2.5 MG TABS tablet TAKE 1 TABLET BY MOUTH TWICE DAILY (Patient taking differently: Take 2.5 mg by mouth 2 (two) times daily. ) 180 tablet 1  . ergocalciferol (VITAMIN D2) 1.25 MG (50000 UT) capsule Take 1 capsule (50,000 Units total) by mouth once a week. (Patient taking differently: Take 50,000 Units by mouth once a week. Saturday) 12 capsule 3  . gabapentin (NEURONTIN) 300 MG capsule Take 1 capsule (300 mg total) by mouth at bedtime.    . pantoprazole (PROTONIX) 40 MG tablet TAKE 1  TABLET BY MOUTH EVERY DAY (Patient taking differently: Take 40 mg by mouth daily. ) 90 tablet 1  . prednisoLONE acetate (PRED FORTE) 1 % ophthalmic suspension Place 1 drop into the left eye daily.     Marland Kitchen rOPINIRole (REQUIP) 1 MG tablet Take 2.5 tablets (2.5 mg total) by mouth at bedtime.    . tizanidine (ZANAFLEX) 2 MG capsule Take 2 mg by mouth 3 (three) times daily.    . traMADol (ULTRAM) 50 MG tablet Take 50 mg by mouth daily.   0  . potassium chloride SA (KLOR-CON) 20 MEQ tablet Take 0.5 tablets (10 mEq total) by mouth 2 (two) times daily. 30 tablet 6  . rOPINIRole (REQUIP) 1 MG tablet TAKE 2 TABLETS BY MOUTH AT BEDTIME (Patient taking differently: Take 2 mg by mouth at bedtime. ) 180 tablet 3  . metoprolol tartrate (LOPRESSOR) 25 MG tablet Take 0.5 tablets (12.5 mg total) by mouth 2 (two) times daily.    . polyethylene glycol (MIRALAX / GLYCOLAX) 17 g packet Take 17 g by mouth daily as needed for mild constipation. (Patient not taking: Reported on 11/16/2019) 14 each 0  . tamsulosin (FLOMAX) 0.4 MG CAPS capsule Take 1 capsule (0.4 mg total) by mouth daily after breakfast. (Patient not taking: Reported on 11/16/2019) 30 capsule   . tiZANidine (ZANAFLEX) 2 MG tablet Take 2 mg by mouth every evening.      No facility-administered medications prior to visit.     Per HPI unless specifically indicated in ROS section below Review of Systems Objective:  BP 127/68   Pulse (!) 124   Temp 97.6 F (36.4 C)   Ht 5\' 2"  (1.575 m)   Wt 136 lb (61.7 kg)   SpO2 96%   BMI 24.87 kg/m   Wt Readings from Last 3 Encounters:  11/16/19 136 lb (61.7 kg)  10/23/19 142 lb (64.4 kg)  08/24/19 140 lb (63.5 kg)      Physical Exam Vitals and nursing note reviewed.  Constitutional:      Appearance: Normal appearance. She is not ill-appearing.     Comments: Walks with walker  Cardiovascular:     Rate and Rhythm: Normal rate and regular rhythm.     Pulses: Normal pulses.     Heart sounds: Normal heart  sounds. No murmur heard.   Pulmonary:     Effort: Pulmonary effort is normal. No respiratory distress.     Breath sounds: Normal breath sounds. No wheezing, rhonchi or rales.  Musculoskeletal:     Right lower leg: No edema.     Left lower leg: No edema.     Comments:  Purple discoloration to bilateral lower extremities Diminished DP bilaterally  Skin:    General: Skin is cool and dry.     Capillary Refill: Capillary refill takes 2 to 3  seconds.  Neurological:     Mental Status: She is alert.  Psychiatric:        Mood and Affect: Mood normal.        Behavior: Behavior normal.       Results for orders placed or performed in visit on 11/16/19  Renal function panel  Result Value Ref Range   Sodium 139 135 - 145 mEq/L   Potassium 4.2 3.5 - 5.1 mEq/L   Chloride 105 96 - 112 mEq/L   CO2 21 19 - 32 mEq/L   Albumin 4.2 3.5 - 5.2 g/dL   BUN 12 6 - 23 mg/dL   Creatinine, Ser 1.17 0.40 - 1.20 mg/dL   Glucose, Bld 98 70 - 99 mg/dL   Phosphorus 4.0 2.3 - 4.6 mg/dL   GFR 43.61 (L) >60.00 mL/min   Calcium 9.7 8.4 - 10.5 mg/dL  CBC with Differential/Platelet  Result Value Ref Range   WBC 6.3 4.0 - 10.5 K/uL   RBC 4.07 3.87 - 5.11 Mil/uL   Hemoglobin 13.3 12.0 - 15.0 g/dL   HCT 40.2 36 - 46 %   MCV 98.9 78.0 - 100.0 fl   MCHC 33.0 30.0 - 36.0 g/dL   RDW 15.0 11.5 - 15.5 %   Platelets 301.0 150 - 400 K/uL   Neutrophils Relative % 66.2 43 - 77 %   Lymphocytes Relative 20.5 12 - 46 %   Monocytes Relative 9.8 3 - 12 %   Eosinophils Relative 1.9 0 - 5 %   Basophils Relative 1.6 0 - 3 %   Neutro Abs 4.1 1.4 - 7.7 K/uL   Lymphs Abs 1.3 0.7 - 4.0 K/uL   Monocytes Absolute 0.6 0 - 1 K/uL   Eosinophils Absolute 0.1 0 - 0 K/uL   Basophils Absolute 0.1 0 - 0 K/uL  Vitamin B12  Result Value Ref Range   Vitamin B-12 277 211 - 911 pg/mL   LEFT WRIST - COMPLETE 3+ VIEW COMPARISON:  None. FINDINGS: Comminuted, impacted fracture of the distal radial metaphysis and epiphysis with extension  into the radiocarpal joint. Nondisplaced fracture at the base of the ulnar styloid. Dorsal angulation of the distal fragments of the distal radius fracture without significant displacement. There is also a possible dorsal carpal avulsion fracture. IMPRESSION: 1. Distal radius and ulnar styloid fractures, as described above. 2. Possible dorsal carpal avulsion fracture. Electronically Signed   By: Claudie Revering M.D.   On: 10/23/2019 12:56 Assessment & Plan:  This visit occurred during the SARS-CoV-2 public health emergency.  Safety protocols were in place, including screening questions prior to the visit, additional usage of staff PPE, and extensive cleaning of exam room while observing appropriate contact time as indicated for disinfecting solutions.   Problem List Items Addressed This Visit    Vitamin B12 deficiency    Has not been receiving IM b12 replacement - will update B12 shot today and check levels.       Relevant Orders   Vitamin B12 (Completed)   Urge incontinence    Flomax started due to urinary retention while hospitalized.  Since home has not needed. Advised stay off flomax, monitor for recurrent retention.       Ulcer of right foot ([place])    Poorly healing but slowly decreasing in size - appreciate wound care.  Wound culture from 10/12/2019 reviewed - kelbsiella and less predominantly stenotrophomonas maltophilia - caution with bactrim use with recent ARF - will see if abx still needed and if  so, if she can transition to different antibiotic. Upcoming appt later this month with wound care clinic.       RLS (restless legs syndrome)    Worsening - suggested trial higher dose - increase requip from 2mg  to 2.5mg  at night time, update with effect.       Peripheral neuropathy    Continue gabapentin 300mg  nightly.       Relevant Medications   tizanidine (ZANAFLEX) 2 MG capsule   rOPINIRole (REQUIP) 1 MG tablet   PAD (peripheral artery disease) (HCC)   Relevant Medications    metoprolol tartrate (LOPRESSOR) 25 MG tablet   Osteoporosis    Possible new compression fractures T5 and L1 (06/2019) due for rpt DEXA - will update labs and if stable then will schedule. prolia started ~08/2017.       Fall   Essential hypertension    Recently taken off all antihypertensives after hospitalization with ARF - given tachycardia noted I did suggest restarting metoprolol 25mg  bid, also reviewed importance of good hydration status. Hold potassium for now while off lasix.      Relevant Medications   metoprolol tartrate (LOPRESSOR) 25 MG tablet   Closed fracture of right wrist    Conservative non operative management, followed closely by ortho - seems to be healing well.      Acute renal failure superimposed on stage 3a chronic kidney disease ([place]) - Primary    Noted on recent hospitalization, improving upon discharge but she didn't have labs done while at rehab. Update labs today, then recommendations will be made pending results.       Relevant Orders   Renal function panel (Completed)   CBC with Differential/Platelet (Completed)       Meds ordered this encounter  Medications  . cyanocobalamin ((VITAMIN B-12)) injection 1,000 mcg   Orders Placed This Encounter  Procedures  . Renal function panel  . CBC with Differential/Platelet  . Vitamin B12    Patient Instructions  Labs then b12 shot today.  Labs will determine what we do with lisinoporil and lasix.  Restart metoprolol 25mg  1/2 tablet twice daily.  Ok to stay off flomax - but watch for recurrent urine retention and restart if that happens (let me know).  Ok to try higher requip dose 2.5mg  at night time.  Hold potassium for now.  Return in 6 weeks for follow up visit.    Follow up plan: Return in about 6 weeks (around 12/28/2019), or if symptoms worsen or fail to improve, for follow up visit.  Ria Bush, MD

## 2019-11-16 NOTE — Patient Instructions (Addendum)
Labs then b12 shot today.  Labs will determine what we do with lisinoporil and lasix.  Restart metoprolol 25mg  1/2 tablet twice daily.  Ok to stay off flomax - but watch for recurrent urine retention and restart if that happens (let me know).  Ok to try higher requip dose 2.5mg  at night time.  Hold potassium for now.  Return in 6 weeks for follow up visit.

## 2019-11-17 LAB — CBC WITH DIFFERENTIAL/PLATELET
Basophils Absolute: 0.1 10*3/uL (ref 0.0–0.1)
Basophils Relative: 1.6 % (ref 0.0–3.0)
Eosinophils Absolute: 0.1 10*3/uL (ref 0.0–0.7)
Eosinophils Relative: 1.9 % (ref 0.0–5.0)
HCT: 40.2 % (ref 36.0–46.0)
Hemoglobin: 13.3 g/dL (ref 12.0–15.0)
Lymphocytes Relative: 20.5 % (ref 12.0–46.0)
Lymphs Abs: 1.3 10*3/uL (ref 0.7–4.0)
MCHC: 33 g/dL (ref 30.0–36.0)
MCV: 98.9 fl (ref 78.0–100.0)
Monocytes Absolute: 0.6 10*3/uL (ref 0.1–1.0)
Monocytes Relative: 9.8 % (ref 3.0–12.0)
Neutro Abs: 4.1 10*3/uL (ref 1.4–7.7)
Neutrophils Relative %: 66.2 % (ref 43.0–77.0)
Platelets: 301 10*3/uL (ref 150.0–400.0)
RBC: 4.07 Mil/uL (ref 3.87–5.11)
RDW: 15 % (ref 11.5–15.5)
WBC: 6.3 10*3/uL (ref 4.0–10.5)

## 2019-11-17 LAB — RENAL FUNCTION PANEL
Albumin: 4.2 g/dL (ref 3.5–5.2)
BUN: 12 mg/dL (ref 6–23)
CO2: 21 mEq/L (ref 19–32)
Calcium: 9.7 mg/dL (ref 8.4–10.5)
Chloride: 105 mEq/L (ref 96–112)
Creatinine, Ser: 1.17 mg/dL (ref 0.40–1.20)
GFR: 43.61 mL/min — ABNORMAL LOW (ref >60.00)
Glucose, Bld: 98 mg/dL (ref 70–99)
Phosphorus: 4 mg/dL (ref 2.3–4.6)
Potassium: 4.2 mEq/L (ref 3.5–5.1)
Sodium: 139 mEq/L (ref 135–145)

## 2019-11-17 LAB — VITAMIN B12: Vitamin B-12: 277 pg/mL (ref 211–911)

## 2019-11-18 ENCOUNTER — Telehealth: Payer: Self-pay | Admitting: Family Medicine

## 2019-11-18 MED ORDER — POTASSIUM CHLORIDE CRYS ER 10 MEQ PO TBCR: 10.0000 meq | EXTENDED_RELEASE_TABLET | Freq: Every day | ORAL | 3 refills | Status: DC | PRN

## 2019-11-18 MED ORDER — FUROSEMIDE 20 MG PO TABS: 20.0000 mg | ORAL_TABLET | Freq: Every day | ORAL | 3 refills | Status: DC | PRN

## 2019-11-18 NOTE — Assessment & Plan Note (Addendum)
Recently taken off all antihypertensives after hospitalization with ARF - given tachycardia noted I did suggest restarting metoprolol 25mg  bid, also reviewed importance of good hydration status. Hold potassium for now while off lasix.

## 2019-11-18 NOTE — Assessment & Plan Note (Addendum)
Conservative non operative management, followed closely by ortho - seems to be healing well.

## 2019-11-18 NOTE — Telephone Encounter (Signed)
We have to be cautious with bactrim as it can cause kidney problems - would recommend a different antibiotic - touch base with wound clinic when she sees them tomorrow.

## 2019-11-18 NOTE — Addendum Note (Signed)
Addended by: Ria Bush on: 11/18/2019 11:44 PM   Modules accepted: Orders

## 2019-11-18 NOTE — Assessment & Plan Note (Addendum)
Flomax started due to urinary retention while hospitalized.  Since home has not needed. Advised stay off flomax, monitor for recurrent retention.

## 2019-11-18 NOTE — Assessment & Plan Note (Signed)
Has not been receiving IM b12 replacement - will update B12 shot today and check levels.

## 2019-11-18 NOTE — Assessment & Plan Note (Signed)
Noted on recent hospitalization, improving upon discharge but she didn't have labs done while at rehab. Update labs today, then recommendations will be made pending results.

## 2019-11-18 NOTE — Assessment & Plan Note (Signed)
Possible new compression fractures T5 and L1 (06/2019) due for rpt DEXA - will update labs and if stable then will schedule. prolia started ~08/2017.

## 2019-11-18 NOTE — Assessment & Plan Note (Signed)
Worsening - suggested trial higher dose - increase requip from 2mg  to 2.5mg  at night time, update with effect.

## 2019-11-18 NOTE — Assessment & Plan Note (Signed)
Poorly healing but slowly decreasing in size - appreciate wound care.  Wound culture from 10/12/2019 reviewed - kelbsiella and less predominantly stenotrophomonas maltophilia - caution with bactrim use with recent ARF - will see if abx still needed and if so, if she can transition to different antibiotic. Upcoming appt later this month with wound care clinic.

## 2019-11-18 NOTE — Telephone Encounter (Signed)
Patient's daughter called office regarding Bactrim that was given by her foot doctor. This antibiotic was given patient before her hospital stay on 10/23/2019. Patient was seen by Dr Danise Mina on 11/16/2019.  She wanted to know if patient should complete the Bactrim. Patient is going to see the foot doctor on Friday but wanted Dr Danise Mina input.

## 2019-11-18 NOTE — Assessment & Plan Note (Signed)
Continue gabapentin 300 mg nightly 

## 2019-11-19 ENCOUNTER — Other Ambulatory Visit: Payer: Self-pay

## 2019-11-19 ENCOUNTER — Encounter: Payer: Medicare Other | Attending: Physician Assistant | Admitting: Physician Assistant

## 2019-11-19 ENCOUNTER — Telehealth: Payer: Self-pay

## 2019-11-19 DIAGNOSIS — I2510 Atherosclerotic heart disease of native coronary artery without angina pectoris: Secondary | ICD-10-CM | POA: Diagnosis not present

## 2019-11-19 DIAGNOSIS — B353 Tinea pedis: Secondary | ICD-10-CM | POA: Insufficient documentation

## 2019-11-19 DIAGNOSIS — E538 Deficiency of other specified B group vitamins: Secondary | ICD-10-CM | POA: Diagnosis not present

## 2019-11-19 DIAGNOSIS — I480 Paroxysmal atrial fibrillation: Secondary | ICD-10-CM | POA: Diagnosis not present

## 2019-11-19 DIAGNOSIS — L97512 Non-pressure chronic ulcer of other part of right foot with fat layer exposed: Secondary | ICD-10-CM | POA: Insufficient documentation

## 2019-11-19 DIAGNOSIS — K219 Gastro-esophageal reflux disease without esophagitis: Secondary | ICD-10-CM | POA: Diagnosis not present

## 2019-11-19 DIAGNOSIS — G2581 Restless legs syndrome: Secondary | ICD-10-CM | POA: Diagnosis not present

## 2019-11-19 DIAGNOSIS — I130 Hypertensive heart and chronic kidney disease with heart failure and stage 1 through stage 4 chronic kidney disease, or unspecified chronic kidney disease: Secondary | ICD-10-CM | POA: Diagnosis not present

## 2019-11-19 DIAGNOSIS — I739 Peripheral vascular disease, unspecified: Secondary | ICD-10-CM | POA: Diagnosis not present

## 2019-11-19 DIAGNOSIS — Z880 Allergy status to penicillin: Secondary | ICD-10-CM | POA: Diagnosis not present

## 2019-11-19 DIAGNOSIS — Z7901 Long term (current) use of anticoagulants: Secondary | ICD-10-CM | POA: Insufficient documentation

## 2019-11-19 DIAGNOSIS — Z7982 Long term (current) use of aspirin: Secondary | ICD-10-CM | POA: Diagnosis not present

## 2019-11-19 DIAGNOSIS — S62012D Displaced fracture of distal pole of navicular [scaphoid] bone of left wrist, subsequent encounter for fracture with routine healing: Secondary | ICD-10-CM | POA: Diagnosis not present

## 2019-11-19 DIAGNOSIS — I7389 Other specified peripheral vascular diseases: Secondary | ICD-10-CM | POA: Diagnosis not present

## 2019-11-19 DIAGNOSIS — F419 Anxiety disorder, unspecified: Secondary | ICD-10-CM

## 2019-11-19 DIAGNOSIS — Z7952 Long term (current) use of systemic steroids: Secondary | ICD-10-CM | POA: Diagnosis not present

## 2019-11-19 DIAGNOSIS — M810 Age-related osteoporosis without current pathological fracture: Secondary | ICD-10-CM | POA: Diagnosis not present

## 2019-11-19 DIAGNOSIS — Z9181 History of falling: Secondary | ICD-10-CM | POA: Diagnosis not present

## 2019-11-19 DIAGNOSIS — M1990 Unspecified osteoarthritis, unspecified site: Secondary | ICD-10-CM | POA: Diagnosis not present

## 2019-11-19 DIAGNOSIS — L97513 Non-pressure chronic ulcer of other part of right foot with necrosis of muscle: Secondary | ICD-10-CM | POA: Diagnosis not present

## 2019-11-19 DIAGNOSIS — S52502D Unspecified fracture of the lower end of left radius, subsequent encounter for closed fracture with routine healing: Secondary | ICD-10-CM | POA: Diagnosis not present

## 2019-11-19 DIAGNOSIS — E7849 Other hyperlipidemia: Secondary | ICD-10-CM | POA: Diagnosis not present

## 2019-11-19 DIAGNOSIS — S52602D Unspecified fracture of lower end of left ulna, subsequent encounter for closed fracture with routine healing: Secondary | ICD-10-CM | POA: Diagnosis not present

## 2019-11-19 DIAGNOSIS — I872 Venous insufficiency (chronic) (peripheral): Secondary | ICD-10-CM | POA: Diagnosis not present

## 2019-11-19 DIAGNOSIS — R262 Difficulty in walking, not elsewhere classified: Secondary | ICD-10-CM | POA: Diagnosis not present

## 2019-11-19 DIAGNOSIS — S62102D Fracture of unspecified carpal bone, left wrist, subsequent encounter for fracture with routine healing: Secondary | ICD-10-CM | POA: Diagnosis not present

## 2019-11-19 DIAGNOSIS — E559 Vitamin D deficiency, unspecified: Secondary | ICD-10-CM | POA: Diagnosis not present

## 2019-11-19 DIAGNOSIS — M5136 Other intervertebral disc degeneration, lumbar region: Secondary | ICD-10-CM | POA: Diagnosis not present

## 2019-11-19 DIAGNOSIS — I4891 Unspecified atrial fibrillation: Secondary | ICD-10-CM | POA: Diagnosis not present

## 2019-11-19 DIAGNOSIS — I272 Pulmonary hypertension, unspecified: Secondary | ICD-10-CM | POA: Diagnosis not present

## 2019-11-19 DIAGNOSIS — I5032 Chronic diastolic (congestive) heart failure: Secondary | ICD-10-CM | POA: Diagnosis not present

## 2019-11-19 DIAGNOSIS — I70235 Atherosclerosis of native arteries of right leg with ulceration of other part of foot: Secondary | ICD-10-CM | POA: Diagnosis not present

## 2019-11-19 DIAGNOSIS — G9009 Other idiopathic peripheral autonomic neuropathy: Secondary | ICD-10-CM | POA: Diagnosis not present

## 2019-11-19 DIAGNOSIS — N1831 Chronic kidney disease, stage 3a: Secondary | ICD-10-CM | POA: Diagnosis not present

## 2019-11-19 NOTE — Telephone Encounter (Signed)
Agree with this. Thank you.  

## 2019-11-19 NOTE — Telephone Encounter (Signed)
Colletta Maryland OT with [facility] left vm requesting vo for HH OT 1 x a wk for 1 wk;  2 x a wk for 2 wks  And 1 x a wk for 1 wk.

## 2019-11-19 NOTE — Progress Notes (Addendum)
JANYAH, SINGLETERRY ([id number]) Visit Report for 11/19/2019 Chief Complaint Document Details Patient Name: [name], [name]. Date of Service: 11/19/2019 1:00 PM Medical Record Number: [id number] Patient Account Number: [id number] Date of Birth/Sex: 1929/04/26 (86 y.o. F) Treating RN: Cornell Barman Primary Care Provider: Ria Bush Other Clinician: Referring Provider: Ria Bush Treating Provider/Extender: Melburn Hake, Peggy Loge Weeks in Treatment: 5 Information Obtained from: Patient Chief Complaint Right Lateral Foot Ulcer Electronic Signature(s) Signed: 11/19/2019 1:29:26 PM By: Worthy Keeler PA-C Entered By: Worthy Keeler on 11/19/2019 13:29:26 ZAELYN, NOACK ([id number]) -------------------------------------------------------------------------------- Debridement Details Patient Name: [name] Date of Service: 11/19/2019 1:00 PM Medical Record Number: [id number] Patient Account Number: [id number] Date of Birth/Sex: Jul 19, 1929 (86 y.o. F) Treating RN: Cornell Barman Primary Care Provider: Ria Bush Other Clinician: Referring Provider: Ria Bush Treating Provider/Extender: Melburn Hake, [place] Rosamond Weeks in Treatment: 5 Debridement Performed for Wound #3 Right Metatarsal head fifth Assessment: Performed By: Physician STONE III, Sharian Delia E., PA-C Debridement Type: Chemical/Enzymatic/Mechanical Agent Used: Santyl Severity of Tissue Pre Debridement: Fat layer exposed Level of Consciousness (Pre- Awake and Alert procedure): Pain Control: Lidocaine Instrument: Other : tongue blade Bleeding: None Response to Treatment: Procedure was tolerated well Level of Consciousness (Post- Awake and Alert procedure): Post Debridement Measurements of Total Wound Length: (cm) 1 Width: (cm) 1 Depth: (cm) 0.3 Volume: (cm) 0.236 Character of Wound/Ulcer Post Debridement: Stable Severity of Tissue Post Debridement: Fat layer exposed Post Procedure Diagnosis Same as  Pre-procedure Electronic Signature(s) Signed: 11/19/2019 5:00:44 PM By: Worthy Keeler PA-C Signed: 11/19/2019 6:14:49 PM By: Gretta Cool, BSN, RN, CWS, Kim RN, BSN Entered By: Gretta Cool, BSN, RN, CWS, Kim on 11/19/2019 13:46:21 [name] ([id number]) -------------------------------------------------------------------------------- HPI Details Patient Name: [name]. Date of Service: 11/19/2019 1:00 PM Medical Record Number: [id number] Patient Account Number: [id number] Date of Birth/Sex: 12/18/1929 (86 y.o. F) Treating RN: Cornell Barman Primary Care Provider: Ria Bush Other Clinician: Referring Provider: Ria Bush Treating Provider/Extender: Melburn Hake, Junella Domke Weeks in Treatment: 5 History of Present Illness HPI Description: 10/12/2019 upon evaluation today patient appears to be doing somewhat poorly in regard to her wound at this point. She has a ulcer over the right fifth metatarsal region. This has been managed by podiatry up to this point. Unfortunately the patient just does not seem to be making the progress that they were hoping for an subsequently the patient was referred to [place] by [facility] Dr. Posey Pronto. With that being said I did note that the patient does have peripheral vascular disease which has been managed by Dr. Lazaro Arms her most recent angiogram with intervention was on 08/02/2019. The ABIs following were on the right 1.30 with a TBI of 0.61 normal left 1.31 with a TBI of 0.58. Obviously things seem to be doing somewhat better at this point hopefully enough to allow her to heal. She is on anticoagulant therapy due to atrial fibrillation long-term. With that being said the patient is unfortunately having quite a bit of discomfort at this time secondary to the wound. She also has erythema which is very likely secondary to infection. I am going obtain a culture of the area today once debridement is complete. Apparently the patient also had a x-ray that was  performed by Dr. Posey Pronto on 09/21/2019 and this showed that the patient had no obvious signs on x-ray of cortical irregularity and no osteomyelitis obviously noted. With that being said as I discussed with the patient's daughter as well this does not indicate that  the patient absolutely has no osteomyelitis as x-rays are only at best 50% helpful in identifying this complication. Nonetheless I do believe that we need to have the patient continue to have this further evaluated specifically I am to recommend that we check into an MRI which I think would be ideal to evaluate whether or not there is osteomyelitis present. 7/16; this is a patient with an ulcer over the right fifth metatarsal head. She [facility]. She is due to have an MRI of the foot on 7/26. Using Santyl to the wound 11/04/2019 on evaluation today patient appears to be doing okay in regard to her wound at this time. Unfortunately since I last saw her which was actually her second visit here in the clinic she has moved into a new apartment and then subsequently had a fall. She ended up in the hospital she did fracture her left arm and subsequently has also been dealing with a lot of dizziness she has been at [employer]. She tells me that getting in the change the dressing has been somewhat difficult as far as daily dressing changes are concerned with the Santyl. Nonetheless when they do change it that is what is been used and her daughter-in-law has also been coming in to help out as well with the dressing changes to try to make sure it gets done daily. Fortunately there is no signs of active infection at this time. No fever chills noted. I do believe the Santyl has been beneficial and is likely still the best way to go at this point for the patient. I did review the patient's MRI which showed some potential for reactive osteitis but did not show any signs of osteomyelitis overtly. That cannot be excluded  but again right now it does not appear that is very likely present as well. This is good news. I also did review her culture she has been on the Bactrim she tells me she has taken this and again that was appropriate based on the results of her culture. 11/19/2019 upon evaluation today patient appears to be doing decently well in regard to her wound. Fortunately there is no signs of active infection at this time wound is not significantly larger in fact is roughly about the same at this point. With that being said there is not as much erythema as previously noted I think the infection seems to be under much better control which is great news. Unfortunately she has fallen since I last saw her and broken her left arm she has an appointment with them following the appointment with me. Electronic Signature(s) Signed: 11/19/2019 1:53:47 PM By: Worthy Keeler PA-C Entered By: Worthy Keeler on 11/19/2019 13:53:47 DAMEISHA, TSCHIDA ([id number]) -------------------------------------------------------------------------------- Physical Exam Details Patient Name: GLENNA, BRUNKOW. Date of Service: 11/19/2019 1:00 PM Medical Record Number: [id number] Patient Account Number: [id number] Date of Birth/Sex: May 02, 1929 (86 y.o. F) Treating RN: Cornell Barman Primary Care Provider: Ria Bush Other Clinician: Referring Provider: Ria Bush Treating Provider/Extender: STONE III, Aldeen Riga Weeks in Treatment: 5 Constitutional Well-nourished and well-hydrated in no acute distress. Respiratory normal breathing without difficulty. Psychiatric this patient is able to make decisions and demonstrates good insight into disease process. Alert and Oriented x 3. pleasant and cooperative. Notes Upon inspection patient's wound bed actually showed signs of somewhat poor granulation at this time there is still a lot of necrotic tissue noted that there is no signs of infection/inflammation overall which is great  news. Electronic Signature(s) Signed: 11/19/2019 1:54:25 PM By: Worthy Keeler PA-C Entered By: Worthy Keeler on 11/19/2019 13:54:24 SOFIA, VANMETER ([id number]) -------------------------------------------------------------------------------- Physician Orders Details Patient Name: [name] Date of Service: 11/19/2019 1:00 PM Medical Record Number: [id number] Patient Account Number: [id number] Date of Birth/Sex: 1930-03-23 (86 y.o. F) Treating RN: Cornell Barman Primary Care Provider: Ria Bush Other Clinician: Referring Provider: Ria Bush Treating Provider/Extender: Melburn Hake, Amier Katia Hannen Weeks in Treatment: 5 Verbal / Phone Orders: No Diagnosis Coding ICD-10 Coding Code Description I73.89 Other specified peripheral vascular diseases L97.512 Non-pressure chronic ulcer of other part of right foot with fat layer exposed Z79.01 Long term (current) use of anticoagulants I48.0 Paroxysmal atrial fibrillation Wound Cleansing Wound #3 Right Metatarsal head fifth o Clean wound with Normal Saline. - With each dressing change o Other: - wound cleaner can be used as well. Primary Wound Dressing Wound #3 Right Metatarsal head fifth o Santyl Ointment Secondary Dressing Wound #3 Right Metatarsal head fifth o Boardered Foam Dressing Dressing Change Frequency Wound #3 Right Metatarsal head fifth o Change dressing every day. Follow-up Appointments Wound #3 Right Metatarsal head fifth o Return Appointment in 2 weeks. Edema Control Wound #3 Right Metatarsal head fifth o Elevate legs to the level of the heart and pump ankles as often as possible Off-Loading Wound #3 Right Metatarsal head fifth o Other: - Keep pressure off of the wound Additional Orders / Instructions Wound #3 Right Metatarsal head fifth o Increase protein intake. Medications-please add to medication list. Wound #3 Right Metatarsal head fifth o Santyl Enzymatic Ointment Electronic  Signature(s) Signed: 11/19/2019 5:00:44 PM By: Mike Gip, Joline Salt ([id number]) Signed: 11/19/2019 6:14:49 PM By: Gretta Cool, BSN, RN, CWS, Kim RN, BSN Entered By: Gretta Cool, BSN, RN, CWS, Kim on 11/19/2019 13:47:01 MALACHI, SUDERMAN ([id number]) -------------------------------------------------------------------------------- Problem List Details Patient Name: ANEISA, KARREN. Date of Service: 11/19/2019 1:00 PM Medical Record Number: [id number] Patient Account Number: [id number] Date of Birth/Sex: [birth year] (86 y.o. F) Treating RN: Cornell Barman Primary Care Provider: Ria Bush Other Clinician: Referring Provider: Ria Bush Treating Provider/Extender: Melburn Hake, Azalee Weimer Weeks in Treatment: 5 Active Problems ICD-10 Encounter Code Description Active Date MDM Diagnosis I73.89 Other specified peripheral vascular diseases 10/12/2019 No Yes L97.512 Non-pressure chronic ulcer of other part of right foot with fat layer 10/12/2019 No Yes exposed Z79.01 Long term (current) use of anticoagulants 10/12/2019 No Yes I48.0 Paroxysmal atrial fibrillation 10/12/2019 No Yes Inactive Problems Resolved Problems Electronic Signature(s) Signed: 11/19/2019 1:29:19 PM By: Worthy Keeler PA-C Entered By: Worthy Keeler on 11/19/2019 13:29:19 [name] ([id number]) -------------------------------------------------------------------------------- Progress Note Details Patient Name: [name]. Date of Service: 11/19/2019 1:00 PM Medical Record Number: [id number] Patient Account Number: [id number] Date of Birth/Sex: [birth year] (86 y.o. F) Treating RN: Cornell Barman Primary Care Provider: Ria Bush Other Clinician: Referring Provider: Ria Bush Treating Provider/Extender: Melburn Hake,  Weeks in Treatment: 5 Subjective Chief Complaint Information obtained from Patient Right Lateral Foot Ulcer History of Present Illness (HPI) 10/12/2019 upon evaluation today patient  appears to be doing somewhat poorly in regard to her wound at this point. She has a ulcer over the right fifth metatarsal region. This has been managed by podiatry up to this point. Unfortunately the patient just does not seem to be making the progress that they were hoping for an subsequently the patient was referred to [place] by [facility] Dr. Posey Pronto. With that being said I did note that the patient does  have peripheral vascular disease which has been managed by Dr. Lazaro Arms her most recent angiogram with intervention was on 08/02/2019. The ABIs following were on the right 1.30 with a TBI of 0.61 normal left 1.31 with a TBI of 0.58. Obviously things seem to be doing somewhat better at this point hopefully enough to allow her to heal. She is on anticoagulant therapy due to atrial fibrillation long-term. With that being said the patient is unfortunately having quite a bit of discomfort at this time secondary to the wound. She also has erythema which is very likely secondary to infection. I am going obtain a culture of the area today once debridement is complete. Apparently the patient also had a x-ray that was performed by Dr. Posey Pronto on 09/21/2019 and this showed that the patient had no obvious signs on x-ray of cortical irregularity and no osteomyelitis obviously noted. With that being said as I discussed with the patient's daughter as well this does not indicate that the patient absolutely has no osteomyelitis as x-rays are only at best 50% helpful in identifying this complication. Nonetheless I do believe that we need to have the patient continue to have this further evaluated specifically I am to recommend that we check into an MRI which I think would be ideal to evaluate whether or not there is osteomyelitis present. 7/16; this is a patient with an ulcer over the right fifth metatarsal head. She [facility] with [facility] She is due to have an MRI of the foot on 7/26. Using  Santyl to the wound 11/04/2019 on evaluation today patient appears to be doing okay in regard to her wound at this time. Unfortunately since I last saw her which was actually her second visit here in the clinic she has moved into a new apartment and then subsequently had a fall. She ended up in the hospital she did fracture her left arm and subsequently has also been dealing with a lot of dizziness she has been at [employer]. She tells me that getting in the change the dressing has been somewhat difficult as far as daily dressing changes are concerned with the Santyl. Nonetheless when they do change it that is what is been used and her daughter-in-law has also been coming in to help out as well with the dressing changes to try to make sure it gets done daily. Fortunately there is no signs of active infection at this time. No fever chills noted. I do believe the Santyl has been beneficial and is likely still the best way to go at this point for the patient. I did review the patient's MRI which showed some potential for reactive osteitis but did not show any signs of osteomyelitis overtly. That cannot be excluded but again right now it does not appear that is very likely present as well. This is good news. I also did review her culture she has been on the Bactrim she tells me she has taken this and again that was appropriate based on the results of her culture. 11/19/2019 upon evaluation today patient appears to be doing decently well in regard to her wound. Fortunately there is no signs of active infection at this time wound is not significantly larger in fact is roughly about the same at this point. With that being said there is not as much erythema as previously noted I think the infection seems to be under much better control which is great news. Unfortunately she has fallen since I last  saw her and broken her left arm she has an appointment with them following the appointment with  me. Objective Constitutional Well-nourished and well-hydrated in no acute distress. Vitals Time Taken: 1:20 AM, Height: 62 in, Weight: 140 lbs, BMI: 25.6, Temperature: 98.4 F, Pulse: 67 bpm, Respiratory Rate: 16 breaths/min, Blood Pressure: 142/77 mmHg. Respiratory normal breathing without difficulty. Psychiatric this patient is able to make decisions and demonstrates good insight into disease process. Alert and Oriented x 3. pleasant and cooperative. KANDI, BRUSSEAU ([id number]) General Notes: Upon inspection patient's wound bed actually showed signs of somewhat poor granulation at this time there is still a lot of necrotic tissue noted that there is no signs of infection/inflammation overall which is great news. Integumentary (Hair, Skin) Wound #3 status is Open. Original cause of wound was Gradually Appeared. The wound is located on the Right Metatarsal head fifth. The wound measures 1cm length x 1cm width x 0.3cm depth; 0.785cm^2 area and 0.236cm^3 volume. There is Fat Layer (Subcutaneous Tissue) Exposed exposed. There is a medium amount of serosanguineous drainage noted. The wound margin is distinct with the outline attached to the wound base. There is small (1-33%) red granulation within the wound bed. There is a large (67-100%) amount of necrotic tissue within the wound bed including Adherent Slough. Assessment Active Problems ICD-10 Other specified peripheral vascular diseases Non-pressure chronic ulcer of other part of right foot with fat layer exposed Long term (current) use of anticoagulants Paroxysmal atrial fibrillation Procedures Wound #3 Pre-procedure diagnosis of Wound #3 is an Arterial Insufficiency Ulcer located on the Right Metatarsal head fifth .Severity of Tissue Pre Debridement is: Fat layer exposed. There was a Chemical/Enzymatic/Mechanical debridement performed by STONE III, Shequita Peplinski E., PA-C. With the following instrument(s): tongue blade after achieving pain  control using Lidocaine. Agent used was [employer]. There was no bleeding. The procedure was tolerated well. Post Debridement Measurements: 1cm length x 1cm width x 0.3cm depth; 0.236cm^3 volume. Character of Wound/Ulcer Post Debridement is stable. Severity of Tissue Post Debridement is: Fat layer exposed. Post procedure Diagnosis Wound #3: Same as Pre-Procedure Plan Wound Cleansing: Wound #3 Right Metatarsal head fifth: Clean wound with Normal Saline. - With each dressing change Other: - wound cleaner can be used as well. Primary Wound Dressing: Wound #3 Right Metatarsal head fifth: Santyl Ointment Secondary Dressing: Wound #3 Right Metatarsal head fifth: Boardered Foam Dressing Dressing Change Frequency: Wound #3 Right Metatarsal head fifth: Change dressing every day. Follow-up Appointments: Wound #3 Right Metatarsal head fifth: Return Appointment in 2 weeks. Edema Control: Wound #3 Right Metatarsal head fifth: Elevate legs to the level of the heart and pump ankles as often as possible Off-Loading: Wound #3 Right Metatarsal head fifth: Other: - Keep pressure off of the wound Additional Orders / Instructions: Wound #3 Right Metatarsal head fifth: Increase protein intake. Medications-please add to medication list.: Wound #3 Right Metatarsal head fifth: Santyl Enzymatic Ointment TAMECKA, MILHAM. ([id number]) 1. I would recommend at this point that we going to continue with the Santyl I think that still the best option for the patient. 2. I'm also can recommend continuing appropriate offloading to keep pressure off this area. 3. I would also recommend that we continue to change this dressing daily in order to keep it clean and allow the Santyl to perform its function. We will see patient back for reevaluation in 2 weeks here in the clinic. If anything worsens or changes patient will contact our office for additional recommendations. Electronic Signature(s) Signed: 11/19/2019 1:54:50  PM By: Worthy Keeler PA-C Entered By: Worthy Keeler on 11/19/2019 13:54:50 [name] ([id number]) -------------------------------------------------------------------------------- SuperBill Details Patient Name: [name] Date of Service: 11/19/2019 Medical Record Number: [id number] Patient Account Number: [id number] Date of Birth/Sex: July 13, 1929 (86 y.o. F) Treating RN: Cornell Barman Primary Care Provider: Ria Bush Other Clinician: Referring Provider: Ria Bush Treating Provider/Extender: Melburn Hake, [place] Kidd Weeks in Treatment: 5 Diagnosis Coding ICD-10 Codes Code Description I73.89 Other specified peripheral vascular diseases L97.512 Non-pressure chronic ulcer of other part of right foot with fat layer exposed Z79.01 Long term (current) use of anticoagulants I48.0 Paroxysmal atrial fibrillation Facility Procedures CPT4 Code: 71245809 Description: 98338 - DEBRIDE W/O ANES NON SELECT Modifier: Quantity: 1 Physician Procedures CPT4 Code: 2505397 Description: 67341 - WC PHYS LEVEL 4 - EST PT Modifier: Quantity: 1 CPT4 Code: Description: ICD-10 Diagnosis Description I73.89 Other specified peripheral vascular diseases L97.512 Non-pressure chronic ulcer of other part of right foot with fat layer e Z79.01 Long term (current) use of anticoagulants I48.0 Paroxysmal atrial  fibrillation Modifier: xposed Quantity: Electronic Signature(s) Signed: 11/19/2019 1:55:05 PM By: Worthy Keeler PA-C Entered By: Worthy Keeler on 11/19/2019 13:55:05

## 2019-11-19 NOTE — Progress Notes (Addendum)
[name], [name] ([id number]) Visit Report for 11/19/2019 Arrival Information Details Patient Name: [name]. Date of Service: 11/19/2019 1:00 PM Medical Record Number: [id number] Patient Account Number: [id number] Date of Birth/Sex: 1929/11/13 (86 y.o. F) Treating RN: Cornell Barman Primary Care Mikhail Hallenbeck: Ria Bush Other Clinician: Referring Kemya Shed: Ria Bush Treating Freemon Binford/Extender: Melburn Hake, HOYT Weeks in Treatment: 5 Visit Information History Since Last Visit All ordered tests and consults were completed: No Patient Arrived: Gilford Rile Added or deleted any medications: No Arrival Time: 13:35 Any new allergies or adverse reactions: No Accompanied By: daughter Had a fall or experienced change in Yes Transfer Assistance: None activities of daily living that may affect Patient Identification Verified: Yes risk of falls: Secondary Verification Process Completed: Yes Signs or symptoms of abuse/neglect since last visito No Patient Requires Transmission-Based No Hospitalized since last visit: Yes Precautions: Implantable device outside of the clinic excluding No Patient Has Alerts: Yes cellular tissue based products placed in the center Patient Alerts: 08/31/19 ABI R)1.3 L) since last visit: 1.31 Has Dressing in Place as Prescribed: Yes 08/31/19 TBI R).61 Pain Present Now: No L).58 Electronic Signature(s) Signed: 11/22/2019 11:48:21 AM By: Darci Needle Entered By: Darci Needle on 11/19/2019 13:37:42 [name] ([id number]) -------------------------------------------------------------------------------- Encounter Discharge Information Details Patient Name: [name] Date of Service: 11/19/2019 1:00 PM Medical Record Number: [id number] Patient Account Number: [id number] Date of Birth/Sex: 1929/10/10 (86 y.o. F) Treating RN: Cornell Barman Primary Care Syrita Dovel: Ria Bush Other Clinician: Referring Zaelyn Barbary: Ria Bush Treating  Lainee Lehrman/Extender: Melburn Hake, HOYT Weeks in Treatment: 5 Encounter Discharge Information Items Post Procedure Vitals Discharge Condition: Stable Temperature (F): 98.4 Ambulatory Status: Walker Pulse (bpm): 67 Discharge Destination: Home Respiratory Rate (breaths/min): 16 Transportation: Private Auto Blood Pressure (mmHg): 142/77 Accompanied By: self Schedule Follow-up Appointment: Yes Clinical Summary of Care: Electronic Signature(s) Signed: 11/19/2019 6:14:49 PM By: Gretta Cool, BSN, RN, CWS, Kim RN, BSN Entered By: Gretta Cool, BSN, RN, CWS, Kim on 11/19/2019 13:49:44 [name] ([id number]) -------------------------------------------------------------------------------- Lower Extremity Assessment Details Patient Name: [name]. Date of Service: 11/19/2019 1:00 PM Medical Record Number: [id number] Patient Account Number: [id number] Date of Birth/Sex: 1929-04-22 (86 y.o. F) Treating RN: Cornell Barman Primary Care Roey Coopman: Ria Bush Other Clinician: Referring Temitope Flammer: Ria Bush Treating Peaches Vanoverbeke/Extender: Melburn Hake, HOYT Weeks in Treatment: 5 Edema Assessment Assessed: [Left: No] [Right: No] Edema: [Left: N] [Right: o] Calf Left: Right: Point of Measurement: 12 cm From Medial Instep cm 28 cm Ankle Left: Right: Point of Measurement: 30 cm From Medial Instep cm 18.5 cm Vascular Assessment Pulses: Dorsalis Pedis Palpable: [Right:Yes] Posterior Tibial Palpable: [Right:Yes] Electronic Signature(s) Signed: 11/19/2019 6:14:49 PM By: Gretta Cool, BSN, RN, CWS, Kim RN, BSN Signed: 11/22/2019 11:48:21 AM By: Darci Needle Entered By: Darci Needle on 11/19/2019 13:40:21 [name] ([id number]) -------------------------------------------------------------------------------- Multi Wound Chart Details Patient Name: [name]. Date of Service: 11/19/2019 1:00 PM Medical Record Number: [id number] Patient Account Number: [id number] Date of Birth/Sex:  Apr 03, 1930 (86 y.o. F) Treating RN: Cornell Barman Primary Care Amilah Greenspan: Ria Bush Other Clinician: Referring Avion Patella: Ria Bush Treating Thornton Dohrmann/Extender: Melburn Hake, HOYT Weeks in Treatment: 5 Vital Signs Height(in): 62 Pulse(bpm): 51 Weight(lbs): 140 Blood Pressure(mmHg): 142/77 Body Mass Index(BMI): 26 Temperature(F): 98.4 Respiratory Rate(breaths/min): 16 Photos: [N/A:N/A] Wound Location: Right Metatarsal head fifth N/A N/A Wounding Event: Gradually Appeared N/A N/A Primary Etiology: Arterial Insufficiency Ulcer N/A N/A Comorbid History: Arrhythmia, Osteoarthritis N/A N/A Date Acquired: 04/09/2019 N/A N/A Weeks of Treatment: 5 N/A N/A Wound Status: Open N/A N/A Measurements L  x W x D (cm) 1x1x0.3 N/A N/A Area (cm) : 0.785 N/A N/A Volume (cm) : 0.236 N/A N/A % Reduction in Area: 30.60% N/A N/A % Reduction in Volume: -108.80% N/A N/A Classification: Full Thickness Without Exposed N/A N/A Support Structures Exudate Amount: Medium N/A N/A Exudate Type: Serosanguineous N/A N/A Exudate Color: red, brown N/A N/A Wound Margin: Distinct, outline attached N/A N/A Granulation Amount: Small (1-33%) N/A N/A Granulation Quality: Red N/A N/A Necrotic Amount: Large (67-100%) N/A N/A Exposed Structures: Fat Layer (Subcutaneous Tissue) N/A N/A Exposed: Yes Fascia: No Tendon: No Muscle: No Joint: No Bone: No Epithelialization: None N/A N/A Treatment Notes Electronic Signature(s) Signed: 11/19/2019 6:14:49 PM By: Gretta Cool, BSN, RN, CWS, Kim RN, BSN Entered By: Gretta Cool, BSN, RN, CWS, Kim on 11/19/2019 13:44:31 [name] ([id number]) -------------------------------------------------------------------------------- Multi-Disciplinary Care Plan Details Patient Name: [name]. Date of Service: 11/19/2019 1:00 PM Medical Record Number: [id number] Patient Account Number: [id number] Date of Birth/Sex: [birth year] (86 y.o. F) Treating RN: Cornell Barman Primary Care  Madelina Sanda: Ria Bush Other Clinician: Referring Briahnna Harries: Ria Bush Treating Jenah Vanasten/Extender: Melburn Hake, HOYT Weeks in Treatment: 5 Active Inactive Abuse / Safety / Falls / Self Care Management Nursing Diagnoses: Potential for falls Goals: Patient/caregiver will verbalize understanding of skin care regimen Date Initiated: 10/12/2019 Target Resolution Date: 11/09/2019 Goal Status: Active Interventions: Assess fall risk on admission and as needed Notes: Necrotic Tissue Nursing Diagnoses: Impaired tissue integrity related to necrotic/devitalized tissue Goals: Necrotic/devitalized tissue will be minimized in the wound bed Date Initiated: 10/12/2019 Target Resolution Date: 11/09/2019 Goal Status: Active Interventions: Assess patient pain level pre-, during and post procedure and prior to discharge Treatment Activities: Enzymatic debridement : 10/12/2019 Notes: Orientation to the Wound Care Program Nursing Diagnoses: Knowledge deficit related to the wound healing center program Goals: Patient/caregiver will verbalize understanding of the [facility] Program Date Initiated: 10/12/2019 Target Resolution Date: 11/09/2019 Goal Status: Active Interventions: Provide education on orientation to the wound center Notes: Pain, Acute or Chronic Nursing Diagnoses: Potential alteration in comfort, pain Goals: Patient will verbalize adequate pain control and receive pain control interventions during procedures as needed TARISA, PAOLA ([id number]) Date Initiated: 10/12/2019 Target Resolution Date: 11/09/2019 Goal Status: Active Interventions: Complete pain assessment as per visit requirements Treatment Activities: Administer pain control measures as ordered : 10/12/2019 Notes: Peripheral Neuropathy Nursing Diagnoses: Knowledge deficit related to disease process and management of peripheral neurovascular dysfunction Potential alteration in peripheral tissue perfusion  (select prior to confirmation of diagnosis) Goals: Patient/caregiver will verbalize understanding of disease process and disease management Date Initiated: 10/12/2019 Target Resolution Date: 11/09/2019 Goal Status: Active Interventions: Assess signs and symptoms of neuropathy upon admission and as needed Provide education on Management of Neuropathy and Related Ulcers Notes: Wound/Skin Impairment Nursing Diagnoses: Impaired tissue integrity Goals: Ulcer/skin breakdown will have a volume reduction of 30% by week 4 Date Initiated: 10/12/2019 Target Resolution Date: 11/09/2019 Goal Status: Active Interventions: Assess ulceration(s) every visit Treatment Activities: Skin care regimen initiated : 10/12/2019 Topical wound management initiated : 10/12/2019 Notes: Electronic Signature(s) Signed: 11/19/2019 6:14:49 PM By: Gretta Cool, BSN, RN, CWS, Kim RN, BSN Entered By: Gretta Cool, BSN, RN, CWS, Kim on 11/19/2019 13:44:23 [name] ([id number]) -------------------------------------------------------------------------------- Pain Assessment Details Patient Name: [name]. Date of Service: 11/19/2019 1:00 PM Medical Record Number: [id number] Patient Account Number: [id number] Date of Birth/Sex: 25-Aug-1929 (86 y.o. F) Treating RN: Cornell Barman Primary Care Jahmil Macleod: Ria Bush Other Clinician: Referring Eisen Robenson: Ria Bush Treating Odell Fasching/Extender: STONE III, HOYT Weeks in  Treatment: 5 Active Problems Location of Pain Severity and Description of Pain Patient Has Paino No Site Locations With Dressing Change: No Pain Management and Medication Current Pain Management: Electronic Signature(s) Signed: 11/19/2019 6:14:49 PM By: Gretta Cool, BSN, RN, CWS, Kim RN, BSN Signed: 11/22/2019 11:48:21 AM By: Darci Needle Entered By: Darci Needle on 11/19/2019 13:38:33 [name]  ([id number]) -------------------------------------------------------------------------------- Patient/Caregiver Education Details Patient Name: TEQUILA, ROTTMANN. Date of Service: 11/19/2019 1:00 PM Medical Record Number: [id number] Patient Account Number: [id number] Date of Birth/Gender: 1929-06-01 (86 y.o. F) Treating RN: Cornell Barman Primary Care Physician: Ria Bush Other Clinician: Referring Physician: Ria Bush Treating Physician/Extender: Sharalyn Ink in Treatment: 5 Education Assessment Education Provided To: Patient Education Topics Provided Wound/Skin Impairment: Handouts: Caring for Your Ulcer, Other: continue wound care as prescribed Methods: Demonstration, Explain/Verbal Responses: State content correctly Electronic Signature(s) Signed: 11/19/2019 6:14:49 PM By: Gretta Cool, BSN, RN, CWS, Kim RN, BSN Entered By: Gretta Cool, BSN, RN, CWS, Kim on 11/19/2019 13:47:55 [name] ([id number]) -------------------------------------------------------------------------------- Wound Assessment Details Patient Name: MELESSIA, KAUS. Date of Service: 11/19/2019 1:00 PM Medical Record Number: [id number] Patient Account Number: [id number] Date of Birth/Sex: [birth year] (86 y.o. F) Treating RN: Cornell Barman Primary Care Kasarah Sitts: Ria Bush Other Clinician: Referring Abubakar Crispo: Ria Bush Treating Nessie Nong/Extender: Melburn Hake, HOYT Weeks in Treatment: 5 Wound Status Wound Number: 3 Primary Etiology: Arterial Insufficiency Ulcer Wound Location: Right Metatarsal head fifth Wound Status: Open Wounding Event: Gradually Appeared Comorbid History: Arrhythmia, Osteoarthritis Date Acquired: 04/09/2019 Weeks Of Treatment: 5 Clustered Wound: No Photos Wound Measurements Length: (cm) 1 Width: (cm) 1 Depth: (cm) 0.3 Area: (cm) 0.785 Volume: (cm) 0.236 % Reduction in Area: 30.6% % Reduction in Volume: -108.8% Epithelialization: None Wound  Description Classification: Full Thickness Without Exposed Support Struct Wound Margin: Distinct, outline attached Exudate Amount: Medium Exudate Type: Serosanguineous Exudate Color: red, brown ures Foul Odor After Cleansing: No Slough/Fibrino Yes Wound Bed Granulation Amount: Small (1-33%) Exposed Structure Granulation Quality: Red Fascia Exposed: No Necrotic Amount: Large (67-100%) Fat Layer (Subcutaneous Tissue) Exposed: Yes Necrotic Quality: Adherent Slough Tendon Exposed: No Muscle Exposed: No Joint Exposed: No Bone Exposed: No Treatment Notes Wound #3 (Right Metatarsal head fifth) Notes santyl, saline gauze, coverlet Electronic Signature(s) Signed: 11/19/2019 6:14:49 PM By: Gretta Cool, BSN, RN, CWS, Kim RN, BSN [place], [place] ([id number]) Signed: 11/22/2019 11:48:21 AM By: Darci Needle Entered By: Darci Needle on 11/19/2019 13:39:25 [name] ([id number]) -------------------------------------------------------------------------------- Vitals Details Patient Name: [name]. Date of Service: 11/19/2019 1:00 PM Medical Record Number: [id number] Patient Account Number: [id number] Date of Birth/Sex: Sep 13, 1929 (86 y.o. F) Treating RN: Cornell Barman Primary Care Dashaun Onstott: Ria Bush Other Clinician: Referring Roger Fasnacht: Ria Bush Treating Minahil Quinlivan/Extender: Melburn Hake, HOYT Weeks in Treatment: 5 Vital Signs Time Taken: 01:20 Temperature (F): 98.4 Height (in): 62 Pulse (bpm): 67 Weight (lbs): 140 Respiratory Rate (breaths/min): 16 Body Mass Index (BMI): 25.6 Blood Pressure (mmHg): 142/77 Reference Range: 80 - 120 mg / dl Electronic Signature(s) Signed: 11/22/2019 11:48:21 AM By: Darci Needle Entered By: Darci Needle on 11/19/2019 13:38:22

## 2019-11-19 NOTE — Telephone Encounter (Signed)
Spoke with pt's daughter, Patricia (on dpr), relaying Dr. G's message.  She verbalizes understanding.  

## 2019-11-19 NOTE — Telephone Encounter (Signed)
Spoke with Stephanie informing her Dr. G is giving verbal orders for services requested.  

## 2019-11-22 ENCOUNTER — Other Ambulatory Visit (INDEPENDENT_AMBULATORY_CARE_PROVIDER_SITE_OTHER): Payer: Self-pay | Admitting: Nurse Practitioner

## 2019-11-22 DIAGNOSIS — L97513 Non-pressure chronic ulcer of other part of right foot with necrosis of muscle: Secondary | ICD-10-CM | POA: Diagnosis not present

## 2019-11-22 DIAGNOSIS — N1831 Chronic kidney disease, stage 3a: Secondary | ICD-10-CM | POA: Diagnosis not present

## 2019-11-22 DIAGNOSIS — I2510 Atherosclerotic heart disease of native coronary artery without angina pectoris: Secondary | ICD-10-CM | POA: Diagnosis not present

## 2019-11-22 DIAGNOSIS — S62102D Fracture of unspecified carpal bone, left wrist, subsequent encounter for fracture with routine healing: Secondary | ICD-10-CM | POA: Diagnosis not present

## 2019-11-22 DIAGNOSIS — I872 Venous insufficiency (chronic) (peripheral): Secondary | ICD-10-CM | POA: Diagnosis not present

## 2019-11-22 DIAGNOSIS — I739 Peripheral vascular disease, unspecified: Secondary | ICD-10-CM

## 2019-11-22 DIAGNOSIS — G9009 Other idiopathic peripheral autonomic neuropathy: Secondary | ICD-10-CM | POA: Diagnosis not present

## 2019-11-22 DIAGNOSIS — E538 Deficiency of other specified B group vitamins: Secondary | ICD-10-CM | POA: Diagnosis not present

## 2019-11-22 DIAGNOSIS — K219 Gastro-esophageal reflux disease without esophagitis: Secondary | ICD-10-CM | POA: Diagnosis not present

## 2019-11-22 DIAGNOSIS — I5032 Chronic diastolic (congestive) heart failure: Secondary | ICD-10-CM | POA: Diagnosis not present

## 2019-11-22 DIAGNOSIS — I130 Hypertensive heart and chronic kidney disease with heart failure and stage 1 through stage 4 chronic kidney disease, or unspecified chronic kidney disease: Secondary | ICD-10-CM | POA: Diagnosis not present

## 2019-11-22 DIAGNOSIS — Z9181 History of falling: Secondary | ICD-10-CM | POA: Diagnosis not present

## 2019-11-22 DIAGNOSIS — I4891 Unspecified atrial fibrillation: Secondary | ICD-10-CM | POA: Diagnosis not present

## 2019-11-22 DIAGNOSIS — M1990 Unspecified osteoarthritis, unspecified site: Secondary | ICD-10-CM | POA: Diagnosis not present

## 2019-11-22 DIAGNOSIS — M810 Age-related osteoporosis without current pathological fracture: Secondary | ICD-10-CM | POA: Diagnosis not present

## 2019-11-22 DIAGNOSIS — G2581 Restless legs syndrome: Secondary | ICD-10-CM | POA: Diagnosis not present

## 2019-11-22 DIAGNOSIS — E559 Vitamin D deficiency, unspecified: Secondary | ICD-10-CM | POA: Diagnosis not present

## 2019-11-22 DIAGNOSIS — Z7982 Long term (current) use of aspirin: Secondary | ICD-10-CM | POA: Diagnosis not present

## 2019-11-22 DIAGNOSIS — I272 Pulmonary hypertension, unspecified: Secondary | ICD-10-CM | POA: Diagnosis not present

## 2019-11-22 DIAGNOSIS — E7849 Other hyperlipidemia: Secondary | ICD-10-CM | POA: Diagnosis not present

## 2019-11-22 DIAGNOSIS — M5136 Other intervertebral disc degeneration, lumbar region: Secondary | ICD-10-CM | POA: Diagnosis not present

## 2019-11-22 DIAGNOSIS — Z7901 Long term (current) use of anticoagulants: Secondary | ICD-10-CM | POA: Diagnosis not present

## 2019-11-22 DIAGNOSIS — Z7952 Long term (current) use of systemic steroids: Secondary | ICD-10-CM | POA: Diagnosis not present

## 2019-11-22 NOTE — Progress Notes (Signed)
[name], [name] ([id number]) Visit Report for 11/19/2019 Fall Risk Assessment Details Patient Name: [name], [name]. Date of Service: 11/19/2019 1:00 PM Medical Record Number: [id number] Patient Account Number: [id number] Date of Birth/Sex: Nov 06, 1929 (86 y.o. F) Treating RN: Cornell Barman Primary Care Salem Mastrogiovanni: Ria Bush Other Clinician: Referring Kristine Tiley: Ria Bush Treating Kardell Virgil/Extender: Melburn Hake, HOYT Weeks in Treatment: 5 Fall Risk Assessment Items Have you had 2 or more falls in the last 12 monthso 0 No Have you had any fall that resulted in injury in the last 12 monthso 0 Yes FALLS RISK SCREEN History of falling - immediate or within 3 months 25 Yes Secondary diagnosis (Do you have 2 or more medical diagnoseso) 0 No Ambulatory aid None/bed rest/wheelchair/nurse 0 No Crutches/cane/walker 15 Yes Furniture 0 No Intravenous therapy Access/Saline/Heparin Lock 0 No Gait/Transferring Normal/ bed rest/ wheelchair 0 No Weak (short steps with or without shuffle, stooped but able to lift head while walking, may 10 Yes seek support from furniture) Impaired (short steps with shuffle, may have difficulty arising from chair, head down, impaired 0 No balance) Mental Status Oriented to own ability 0 No Electronic Signature(s) Signed: 11/19/2019 6:14:49 PM By: Gretta Cool, BSN, RN, CWS, Kim RN, BSN Signed: 11/22/2019 11:48:21 AM By: Darci Needle Entered By: Darci Needle on 11/19/2019 13:42:42

## 2019-11-23 ENCOUNTER — Encounter (INDEPENDENT_AMBULATORY_CARE_PROVIDER_SITE_OTHER): Payer: Self-pay | Admitting: Vascular Surgery

## 2019-11-23 ENCOUNTER — Other Ambulatory Visit: Payer: Self-pay

## 2019-11-23 ENCOUNTER — Ambulatory Visit (INDEPENDENT_AMBULATORY_CARE_PROVIDER_SITE_OTHER): Payer: Medicare Other

## 2019-11-23 ENCOUNTER — Ambulatory Visit (INDEPENDENT_AMBULATORY_CARE_PROVIDER_SITE_OTHER): Payer: Medicare Other | Admitting: Vascular Surgery

## 2019-11-23 VITALS — BP 128/80 | HR 74 | Resp 16 | Wt 138.0 lb

## 2019-11-23 DIAGNOSIS — I872 Venous insufficiency (chronic) (peripheral): Secondary | ICD-10-CM | POA: Diagnosis not present

## 2019-11-23 DIAGNOSIS — I739 Peripheral vascular disease, unspecified: Secondary | ICD-10-CM

## 2019-11-23 DIAGNOSIS — M1990 Unspecified osteoarthritis, unspecified site: Secondary | ICD-10-CM | POA: Diagnosis not present

## 2019-11-23 DIAGNOSIS — E7849 Other hyperlipidemia: Secondary | ICD-10-CM | POA: Diagnosis not present

## 2019-11-23 DIAGNOSIS — N1831 Chronic kidney disease, stage 3a: Secondary | ICD-10-CM | POA: Diagnosis not present

## 2019-11-23 DIAGNOSIS — M810 Age-related osteoporosis without current pathological fracture: Secondary | ICD-10-CM | POA: Diagnosis not present

## 2019-11-23 DIAGNOSIS — G2581 Restless legs syndrome: Secondary | ICD-10-CM | POA: Diagnosis not present

## 2019-11-23 DIAGNOSIS — Z7982 Long term (current) use of aspirin: Secondary | ICD-10-CM | POA: Diagnosis not present

## 2019-11-23 DIAGNOSIS — Z7901 Long term (current) use of anticoagulants: Secondary | ICD-10-CM | POA: Diagnosis not present

## 2019-11-23 DIAGNOSIS — Z9181 History of falling: Secondary | ICD-10-CM | POA: Diagnosis not present

## 2019-11-23 DIAGNOSIS — L97513 Non-pressure chronic ulcer of other part of right foot with necrosis of muscle: Secondary | ICD-10-CM | POA: Diagnosis not present

## 2019-11-23 DIAGNOSIS — I10 Essential (primary) hypertension: Secondary | ICD-10-CM | POA: Diagnosis not present

## 2019-11-23 DIAGNOSIS — I4819 Other persistent atrial fibrillation: Secondary | ICD-10-CM

## 2019-11-23 DIAGNOSIS — E559 Vitamin D deficiency, unspecified: Secondary | ICD-10-CM | POA: Diagnosis not present

## 2019-11-23 DIAGNOSIS — S62102D Fracture of unspecified carpal bone, left wrist, subsequent encounter for fracture with routine healing: Secondary | ICD-10-CM | POA: Diagnosis not present

## 2019-11-23 DIAGNOSIS — I5032 Chronic diastolic (congestive) heart failure: Secondary | ICD-10-CM | POA: Diagnosis not present

## 2019-11-23 DIAGNOSIS — I130 Hypertensive heart and chronic kidney disease with heart failure and stage 1 through stage 4 chronic kidney disease, or unspecified chronic kidney disease: Secondary | ICD-10-CM | POA: Diagnosis not present

## 2019-11-23 DIAGNOSIS — K219 Gastro-esophageal reflux disease without esophagitis: Secondary | ICD-10-CM | POA: Diagnosis not present

## 2019-11-23 DIAGNOSIS — I4891 Unspecified atrial fibrillation: Secondary | ICD-10-CM | POA: Diagnosis not present

## 2019-11-23 DIAGNOSIS — M5136 Other intervertebral disc degeneration, lumbar region: Secondary | ICD-10-CM | POA: Diagnosis not present

## 2019-11-23 DIAGNOSIS — I2510 Atherosclerotic heart disease of native coronary artery without angina pectoris: Secondary | ICD-10-CM | POA: Diagnosis not present

## 2019-11-23 DIAGNOSIS — G9009 Other idiopathic peripheral autonomic neuropathy: Secondary | ICD-10-CM | POA: Diagnosis not present

## 2019-11-23 DIAGNOSIS — I272 Pulmonary hypertension, unspecified: Secondary | ICD-10-CM | POA: Diagnosis not present

## 2019-11-23 DIAGNOSIS — E538 Deficiency of other specified B group vitamins: Secondary | ICD-10-CM | POA: Diagnosis not present

## 2019-11-23 DIAGNOSIS — Z7952 Long term (current) use of systemic steroids: Secondary | ICD-10-CM | POA: Diagnosis not present

## 2019-11-23 NOTE — Assessment & Plan Note (Signed)
Her ABIs today are 1.08 on the right and 1.21 on the left with digital pressures of 89 on the right and 99 on the left.  Symptomatically doing better.  I want to stretch her out 78-month follow-up intervals at this point.  Continue current medical regimen.

## 2019-11-23 NOTE — Progress Notes (Signed)
MRN : [id number]  [name] is a 86 y.o. (19-May-1929) female who presents with chief complaint of  Chief Complaint  Patient presents with  . Follow-up    ultrasound follow up  .  History of Present Illness: Patient returns today in follow up of PAD.  Both her legs are doing quite well after tibial interventions previously.  The pain she was having in her feet and lower legs is much better.  No open ulceration or infection.  No periprocedural complications.  She did have some increased swelling particularly in the right leg but this remains relatively mild.  Her ABIs today are 1.08 on the right and 1.21 on the left with digital pressures of 89 on the right and 99 on the left.  Current Outpatient Medications  Medication Sig Dispense Refill  . acetaminophen (TYLENOL) 500 MG tablet Take 1,000 mg every 8 (eight) hours as needed by mouth for moderate pain.     Marland Kitchen acyclovir (ZOVIRAX) 400 MG tablet Take 400 mg 2 (two) times daily by mouth.     Marland Kitchen amiodarone (PACERONE) 100 MG tablet Take 1 tablet (100 mg total) by mouth daily.    Marland Kitchen aspirin EC 81 MG tablet Take 1 tablet (81 mg total) by mouth daily. 150 tablet 2  . atorvastatin (LIPITOR) 20 MG tablet TAKE 1 TABLET BY MOUTH ONCE DAILY (Patient taking differently: Take 20 mg by mouth daily. ) 90 tablet 1  . collagenase (SANTYL) ointment Apply 1 application topically daily. 15 g 0  . DULoxetine (CYMBALTA) 60 MG capsule TAKE 1 CAPSULE BY MOUTH ONCE DAILY (Patient taking differently: Take 60 mg by mouth daily. ) 90 capsule 1  . ELIQUIS 2.5 MG TABS tablet TAKE 1 TABLET BY MOUTH TWICE DAILY (Patient taking differently: Take 2.5 mg by mouth 2 (two) times daily. ) 180 tablet 1  . ergocalciferol (VITAMIN D2) 1.25 MG (50000 UT) capsule Take 1 capsule (50,000 Units total) by mouth once a week. (Patient taking differently: Take 50,000 Units by mouth once a week. Saturday) 12 capsule 3  . gabapentin (NEURONTIN) 300 MG capsule Take 1 capsule (300 mg total) by  mouth at bedtime.    . metoprolol tartrate (LOPRESSOR) 25 MG tablet Take 0.5 tablets (12.5 mg total) by mouth 2 (two) times daily.    . pantoprazole (PROTONIX) 40 MG tablet TAKE 1 TABLET BY MOUTH EVERY DAY (Patient taking differently: Take 40 mg by mouth daily. ) 90 tablet 1  . prednisoLONE acetate (PRED FORTE) 1 % ophthalmic suspension Place 1 drop into the left eye daily.     Marland Kitchen rOPINIRole (REQUIP) 1 MG tablet Take 2.5 tablets (2.5 mg total) by mouth at bedtime.    . tizanidine (ZANAFLEX) 2 MG capsule Take 2 mg by mouth 3 (three) times daily.    . traMADol (ULTRAM) 50 MG tablet Take 50 mg by mouth daily.   0  . furosemide (LASIX) 20 MG tablet Take 1 tablet (20 mg total) by mouth daily as needed for fluid or edema. 30 tablet 3  . polyethylene glycol (MIRALAX / GLYCOLAX) 17 g packet Take 17 g by mouth daily as needed for mild constipation. (Patient not taking: Reported on 11/16/2019) 14 each 0  . potassium chloride SA (KLOR-CON) 10 MEQ tablet Take 1 tablet (10 mEq total) by mouth daily as needed (lasix use). 30 tablet 3  . tamsulosin (FLOMAX) 0.4 MG CAPS capsule Take 1 capsule (0.4 mg total) by mouth daily after breakfast. (Patient not taking:  Reported on 11/16/2019) 30 capsule    No current facility-administered medications for this visit.    Past Medical History:  Diagnosis Date  . Abdominal aortic atherosclerosis ([place]) 09/2015   by xray  . Anxiety   . BCC (basal cell carcinoma of skin) 05/2011   on nose  . Breast lesion    a. diagnosed with complex sclerosing lesion with calcifications of the right breast in 08/2016  . Chest pain    a. nuclear stress test 03/2012: normal; b. 09/2018 MV: EF >65%, no isch/scar.   . DDD (degenerative disc disease), lumbosacral 12/06/1992  . Gallstones 09/2015   incidentally by xray  . GERD (gastroesophageal reflux disease) 05/1999  . HERPES ZOSTER 04/13/2007   Qualifier: Diagnosis of  By: [name], [name]   . Hiatal hernia   . History of shingles     ophthalmic, takes acyclovir daily  . Hyperlipidemia 12/2003  . Hypertension 1990  . Mitral regurgitation    a. echo 03/2012: EF 60-65%, no RWMA, mild to mod AI/MR, mod TR, PASP 37 mmHg  . Osteoarthritis 08/21/1989  . Osteoporosis with fracture 11/1997   with compression fracture T12  . Persistent atrial fibrillation ([place]) 03/16/2012   a. CHADS2VASc => 5 (HTN, age x 2, vascular disease, sex category)-->Eliquis 5 BID;  b. Recurrent AF in 06/2016;  c. 01/2017 s/p DCCV;  d. 02/2017 recurrent AFib->Amio added->DCCV; e. 03/2017 recurrent AFib, s/p reload of amio and DCCV 04/02/2017  . Rheumatoid arthritis ([place])   . RLS (restless legs syndrome)     Past Surgical History:  Procedure Laterality Date  . ABDOMINAL HYSTERECTOMY  Age 30 - 54   S/P TAH  . abdominal ultrasound  04/23/2004   Gallstones  . BREAST EXCISIONAL BIOPSY Right 2018   neg/benign complex sclerosing lesion  . BREAST LUMPECTOMY WITH RADIOACTIVE SEED LOCALIZATION Right 10/2016   breast lumpectomy with radioactive seed localization and margin assessment for complex sclerosing lesion [facility])  . BREAST LUMPECTOMY WITH RADIOACTIVE SEED LOCALIZATION Right 11/05/2016   Procedure: RIGHT BREAST LUMPECTOMY WITH RADIOACTIVE SEED LOCALIZATION;  Surgeon: Fanny Skates, MD;  Location: [facility];  Service: General;  Laterality: Right;  . CARDIAC CATHETERIZATION    . CARDIOVERSION N/A 01/31/2017   Procedure: CARDIOVERSION;  Surgeon: Wellington Hampshire, MD;  Location: [facility];  Service: Cardiovascular;  Laterality: N/A;  . CARDIOVERSION N/A 03/03/2017   Procedure: CARDIOVERSION;  Surgeon: Wellington Hampshire, MD;  Location: [facility];  Service: Cardiovascular;  Laterality: N/A;  . CARDIOVERSION N/A 04/02/2017   Procedure: CARDIOVERSION;  Surgeon: Wellington Hampshire, MD;  Location: [facility];  Service: Cardiovascular;  Laterality: N/A;  . CARDIOVERSION N/A 01/16/2018   Procedure: CARDIOVERSION;  Surgeon: Wellington Hampshire, MD;   Location: [facility];  Service: Cardiovascular;  Laterality: N/A;  . CATARACT EXTRACTION  05/2010   left eye  . CERVICAL DISCECTOMY  1992   Fusion (Dr. Joya Salm)  . ESI Bilateral 01/2016   S1 transforaminal ESI x3  . ESOPHAGOGASTRODUODENOSCOPY  01/01/2002   with ulcer, bx. neg; + stricture gastric ulcer with hemorrhage  . ESOPHAGOGASTRODUODENOSCOPY  04/23/2004   Esophageal stricture -- dilated  . INTRAVASCULAR PRESSURE WIRE/FFR STUDY N/A 07/12/2019   Procedure: INTRAVASCULAR PRESSURE WIRE/FFR STUDY;  Surgeon: Wellington Hampshire, MD;  Location: [facility];  Service: Cardiovascular;  Laterality: N/A;  . LOWER EXTREMITY ANGIOGRAPHY Left 02/01/2019   LOWER EXTREMITY ANGIOGRAPHY;  Surgeon: Algernon Huxley, MD  . LOWER EXTREMITY ANGIOGRAPHY Right 03/25/2019   Procedure:  LOWER EXTREMITY ANGIOGRAPHY;  Surgeon: Algernon Huxley, MD;  Location: [facility];  Service: Cardiovascular;  Laterality: Right;  . LOWER EXTREMITY ANGIOGRAPHY Right 08/02/2019   Procedure: LOWER EXTREMITY ANGIOGRAPHY;  Surgeon: Algernon Huxley, MD;  Location: [facility];  Service: Cardiovascular;  Laterality: Right;  . nuclear stress test  03/2012   no ischemia  . PERIPHERAL VASCULAR BALLOON ANGIOPLASTY Left 01/2019   Percutaneous transluminal angioplasty of left anterior and posterior tibial artery with 2.5 mm diameter by 22 cm length angioplasty balloon (Ronna Herskowitz)  . RIGHT/LEFT HEART CATH AND CORONARY ANGIOGRAPHY Bilateral 07/12/2019   Procedure: RIGHT/LEFT HEART CATH AND CORONARY ANGIOGRAPHY;  Surgeon: Wellington Hampshire, MD;  Location: [facility];  Service: Cardiovascular;  Laterality: Bilateral;  . SKIN CANCER EXCISION  05/20/2011   BCC from nose  . US ECHOCARDIOGRAPHY  03/2012   in flutter, EF 60%, mild-mod aortic, mitral, tricuspid regurg, mildly dilated LA     Social History   Tobacco Use  . Smoking status: Former Smoker    Packs/day: 0.25    Years: 1.00    Pack years: 0.25    Quit date:  04/08/1976    Years since quitting: 43.6  . Smokeless tobacco: Never Used  . Tobacco comment: pt smokes occasionally "socially"  Vaping Use  . Vaping Use: Never used  Substance Use Topics  . Alcohol use: No    Alcohol/week: 1.0 standard drink    Types: 1 Glasses of wine per week  . Drug use: No      Family History  Problem Relation Age of Onset  . Emphysema Mother   . Heart disease Father        MI  . Stroke Father        first at age 52.  . Stroke Sister   . Diabetes Sister   . Hypertension Brother   . Heart disease Brother        Heart Valve  . Stroke Brother   . Diabetes Brother   . Cancer Brother        esophageal  . Diabetes Brother   . Diabetes Brother   . Colon cancer Neg Hx     Allergies  Allergen Reactions  . Penicillins Rash and Other (See Comments)    Childhood Allergy Has patient had a PCN reaction causing immediate rash, facial/tongue/throat swelling, SOB or lightheadedness with hypotension: Yes Has patient had a PCN reaction causing severe rash involving mucus membranes or skin necrosis: Unknown Has patient had a PCN reaction that required hospitalization: No Has patient had a PCN reaction occurring within the last 10 years: No If all of the above answers are "NO", then may proceed with Cephalosporin use.     REVIEW OF SYSTEMS(Negative unless checked)  Constitutional: [] ??Weight loss[] ??Fever[] ??Chills Cardiac:[] ??Chest pain[] ??Chest pressure[x] ??Palpitations [] ??Shortness of breath when laying flat [] ??Shortness of breath at rest [x] ??Shortness of breath with exertion. Vascular: [x] ??Pain in legs with walking[x] ??Pain in legsat rest[] ??Pain in legs when laying flat [] ??Claudication [] ??Pain in feet when walking [] ??Pain in feet at rest [] ??Pain in feet when laying flat [] ??History of DVT [] ??Phlebitis [x] ??Swelling in legs [] ??Varicose veins [] ??Non-healing ulcers Pulmonary: [] ??Uses home oxygen  [] ??Productive cough[] ??Hemoptysis [] ??Wheeze [] ??COPD [] ??Asthma Neurologic: [] ??Dizziness [] ??Blackouts [] ??Seizures [] ??History of stroke [] ??History of TIA[] ??Aphasia [] ??Temporary blindness[] ??Dysphagia [] ??Weaknessor numbness in arms [x] ??Weakness or numbnessin legs Musculoskeletal: [x] ??Arthritis [] ??Joint swelling [] ??Joint pain [] ??Low back pain Hematologic:[] ??Easy bruising[] ??Easy bleeding [] ??Hypercoagulable state [] ??Anemic [] ??Hepatitis Gastrointestinal:[] ??Blood in stool[] ??Vomiting blood[] ??Gastroesophageal reflux/heartburn[] ??Abdominal pain Genitourinary: [] ??Chronic kidney disease [] ??Difficulturination [] ??Frequenturination [] ??Burning  with urination[] ??Hematuria Skin: [] ??Rashes [] ??Ulcers [] ??Wounds Psychological: [] ??History of anxiety[] ??History of major depression.  Physical Examination  BP 128/80 (BP Location: Left Arm)   Pulse 74   Resp 16   Wt 138 lb (62.6 kg)   BMI 25.24 kg/m  Gen:  WD/WN, NAD. Appears younger than stated age. Head: Myton/AT, No temporalis wasting. Ear/Nose/Throat: Hearing grossly intact, nares w/o erythema or drainage Eyes: Conjunctiva clear. Sclera non-icteric Neck: Supple.  Trachea midline Pulmonary:  Good air movement, no use of accessory muscles.  Cardiac: irregular Vascular:  Vessel Right Left  Radial Palpable Palpable                          PT 1+ Palpable 1+ Palpable  DP 1+ Palpable Palpable    Musculoskeletal: M/S 5/5 throughout.  No deformity or atrophy. 1+ RLE edema, trace LLE edema. Using a walker. Brace on her left wrist. Neurologic: Sensation grossly intact in extremities.  Symmetrical.  Speech is fluent.  Psychiatric: Judgment intact, Mood & affect appropriate for pt's clinical situation. Dermatologic: No rashes or ulcers noted.  No cellulitis or open wounds.       Labs Recent Results (from the past 2160 hour(s))  Aerobic Culture  (superficial specimen)     Status: None   Collection Time: 10/12/19  1:30 PM   Specimen: Wound  Result Value Ref Range   Specimen Description      WOUND Performed at [facility], [address] [place], [place] [address]    Special Requests      RIGHT FOOT Performed at [facility], [place], [place] [address]    Gram Stain      FEW WBC PRESENT, PREDOMINANTLY PMN MODERATE GRAM NEGATIVE RODS RARE GRAM POSITIVE RODS Performed at [facility], [place] [address], [place], [place] [address]    Culture      ABUNDANT KLEBSIELLA OXYTOCA FEW STENOTROPHOMONAS MALTOPHILIA    Report Status 10/16/2019 FINAL    Organism ID, Bacteria KLEBSIELLA OXYTOCA    Organism ID, Bacteria STENOTROPHOMONAS MALTOPHILIA       Susceptibility   Klebsiella oxytoca - MIC*    AMPICILLIN RESISTANT Resistant     CEFAZOLIN <=4 SENSITIVE Sensitive     CEFEPIME <=0.12 SENSITIVE Sensitive     CEFTAZIDIME <=1 SENSITIVE Sensitive     CEFTRIAXONE <=0.25 SENSITIVE Sensitive     CIPROFLOXACIN <=0.25 SENSITIVE Sensitive     GENTAMICIN <=1 SENSITIVE Sensitive     IMIPENEM <=0.25 SENSITIVE Sensitive     TRIMETH/SULFA <=20 SENSITIVE Sensitive     AMPICILLIN/SULBACTAM <=2 SENSITIVE Sensitive     PIP/TAZO <=4 SENSITIVE Sensitive     * ABUNDANT KLEBSIELLA OXYTOCA   Stenotrophomonas maltophilia - MIC*    LEVOFLOXACIN 2 SENSITIVE Sensitive     TRIMETH/SULFA <=20 SENSITIVE Sensitive     * FEW STENOTROPHOMONAS MALTOPHILIA  Comprehensive metabolic panel     Status: Abnormal   Collection Time: 10/23/19  3:39 PM  Result Value Ref Range   Sodium 134 (L) 135 - 145 mmol/L   Potassium 4.6 3.5 - 5.1 mmol/L   Chloride 98 98 - 111 mmol/L   CO2 28 22 - 32 mmol/L   Glucose, Bld 102 (H) 70 - 99 mg/dL    Comment: Glucose reference range applies only to samples taken after fasting for at least 8 hours.   BUN 30 (H) 8 - 23 mg/dL   Creatinine, Ser 2.12 (H) 0.44 - 1.00 mg/dL  Calcium 8.7 (L) 8.9  - 10.3 mg/dL   Total Protein 6.8 6.5 - 8.1 g/dL   Albumin 4.0 3.5 - 5.0 g/dL   AST 20 15 - 41 U/L   ALT 18 0 - 44 U/L   Alkaline Phosphatase 45 38 - 126 U/L   Total Bilirubin 0.5 0.3 - 1.2 mg/dL   GFR calc non Af Amer 20 (L) >60 mL/min   GFR calc Af Amer 23 (L) >60 mL/min   Anion gap 8 5 - 15    Comment: Performed at [facility], [address] [place], [place] [address]  CBC with Differential     Status: Abnormal   Collection Time: 10/23/19  3:39 PM  Result Value Ref Range   WBC 6.5 4.0 - 10.5 K/uL   RBC 3.48 (L) 3.87 - 5.11 MIL/uL   Hemoglobin 11.4 (L) 12.0 - 15.0 g/dL   HCT 34.4 (L) 36 - 46 %   MCV 98.9 80.0 - 100.0 fL   MCH 32.8 26.0 - 34.0 pg   MCHC 33.1 30.0 - 36.0 g/dL   RDW 13.7 11.5 - 15.5 %   Platelets 235 150 - 400 K/uL   nRBC 0.0 0.0 - 0.2 %   Neutrophils Relative % 76 %   Neutro Abs 5.0 1.7 - 7.7 K/uL   Lymphocytes Relative 11 %   Lymphs Abs 0.7 0.7 - 4.0 K/uL   Monocytes Relative 9 %   Monocytes Absolute 0.6 0 - 1 K/uL   Eosinophils Relative 3 %   Eosinophils Absolute 0.2 0 - 0 K/uL   Basophils Relative 1 %   Basophils Absolute 0.1 0 - 0 K/uL   Immature Granulocytes 0 %   Abs Immature Granulocytes 0.02 0.00 - 0.07 K/uL    Comment: Performed at [facility], [address] [place], [place] [address]  Urinalysis, Complete w Microscopic     Status: Abnormal   Collection Time: 10/23/19  3:39 PM  Result Value Ref Range   Color, Urine YELLOW (A) YELLOW   APPearance CLEAR (A) CLEAR   Specific Gravity, Urine 1.011 1.005 - 1.030   pH 6.0 5.0 - 8.0   Glucose, UA NEGATIVE NEGATIVE mg/dL   Hgb urine dipstick NEGATIVE NEGATIVE   Bilirubin Urine NEGATIVE NEGATIVE   Ketones, ur NEGATIVE NEGATIVE mg/dL   Protein, ur NEGATIVE NEGATIVE mg/dL   Nitrite NEGATIVE NEGATIVE   Leukocytes,Ua NEGATIVE NEGATIVE   RBC / HPF 0-5 0 - 5 RBC/hpf   WBC, UA 0-5 0 - 5 WBC/hpf   Bacteria, UA NONE SEEN NONE SEEN   Squamous Epithelial / LPF NONE SEEN 0 - 5     Comment: Performed at [facility], [address] [place], [place] [address]  SARS Coronavirus 2 by RT PCR (hospital order, performed in [facility] lab) Nasopharyngeal Nasopharyngeal Swab     Status: None   Collection Time: 10/23/19  3:39 PM   Specimen: Nasopharyngeal Swab  Result Value Ref Range   SARS Coronavirus 2 NEGATIVE NEGATIVE    Comment: (NOTE) SARS-CoV-2 target nucleic acids are NOT DETECTED.  The SARS-CoV-2 RNA is generally detectable in upper and lower respiratory specimens during the acute phase of infection. The lowest concentration of SARS-CoV-2 viral copies this assay can detect is 250 copies / mL. A negative result does not preclude SARS-CoV-2 infection and should not be used as the sole basis for treatment or other patient management decisions.  A negative result may occur with improper specimen collection /  handling, submission of specimen other than nasopharyngeal swab, presence of viral mutation(s) within the areas targeted by this assay, and inadequate number of viral copies (<250 copies / mL). A negative result must be combined with clinical observations, patient history, and epidemiological information.  Fact Sheet for Patients:   StrictlyIdeas.no  Fact Sheet for Healthcare Providers: BankingDealers.co.za  This test is not yet approved or  cleared by the [place] FDA and has been authorized for detection and/or diagnosis of SARS-CoV-2 by FDA under an Emergency Use Authorization (EUA).  This EUA will remain in effect (meaning this test can be used) for the duration of the COVID-19 declaration under Section 564(b)(1) of the Act, 21 U.S.C. section 360bbb-3(b)(1), unless the authorization is terminated or revoked sooner.  Performed at [facility], [address] [place], [place] [address]   Sodium, urine, random     Status: None   Collection Time: 10/23/19  3:39 PM  Result  Value Ref Range   Sodium, Ur 77 mmol/L    Comment: Performed at [facility], [address] [place], [place] [address]  Creatinine, urine, random     Status: None   Collection Time: 10/23/19  3:39 PM  Result Value Ref Range   Creatinine, Urine 58 mg/dL    Comment: Performed at [facility], [address] [place], [place] [address]  CBC     Status: Abnormal   Collection Time: 10/24/19  8:28 AM  Result Value Ref Range   WBC 8.3 4.0 - 10.5 K/uL   RBC 3.62 (L) 3.87 - 5.11 MIL/uL   Hemoglobin 11.8 (L) 12.0 - 15.0 g/dL   HCT 36.0 36 - 46 %   MCV 99.4 80.0 - 100.0 fL   MCH 32.6 26.0 - 34.0 pg   MCHC 32.8 30.0 - 36.0 g/dL   RDW 13.8 11.5 - 15.5 %   Platelets 221 150 - 400 K/uL   nRBC 0.0 0.0 - 0.2 %    Comment: Performed at [facility], [address], [place], [place] [address]  Basic metabolic panel     Status: Abnormal   Collection Time: 10/24/19  8:28 AM  Result Value Ref Range   Sodium 137 135 - 145 mmol/L   Potassium 4.8 3.5 - 5.1 mmol/L   Chloride 100 98 - 111 mmol/L   CO2 25 22 - 32 mmol/L   Glucose, Bld 108 (H) 70 - 99 mg/dL    Comment: Glucose reference range applies only to samples taken after fasting for at least 8 hours.   BUN 35 (H) 8 - 23 mg/dL   Creatinine, Ser 2.47 (H) 0.44 - 1.00 mg/dL   Calcium 8.5 (L) 8.9 - 10.3 mg/dL   GFR calc non Af Amer 17 (L) >60 mL/min   GFR calc Af Amer 20 (L) >60 mL/min   Anion gap 12 5 - 15    Comment: Performed at [facility], [address] [place], [place] [address]  CK     Status: None   Collection Time: 10/24/19  8:28 AM  Result Value Ref Range   Total CK 165 38.0 - 234.0 U/L    Comment: Performed at [facility], [address] [place],  [address]  Basic metabolic panel     Status: Abnormal   Collection Time: 10/25/19  2:58 AM  Result Value Ref Range   Sodium 135 135 - 145 mmol/L   Potassium 5.0 3.5 - 5.1 mmol/L   Chloride 106 98 -  111 mmol/L   CO2 25 22 - 32  mmol/L   Glucose, Bld 102 (H) 70 - 99 mg/dL    Comment: Glucose reference range applies only to samples taken after fasting for at least 8 hours.   BUN 36 (H) 8 - 23 mg/dL   Creatinine, Ser 2.08 (H) 0.44 - 1.00 mg/dL   Calcium 8.3 (L) 8.9 - 10.3 mg/dL   GFR calc non Af Amer 21 (L) >60 mL/min   GFR calc Af Amer 24 (L) >60 mL/min   Anion gap 4 (L) 5 - 15    Comment: Performed at [facility], [address] [place], [place] [address]  Magnesium     Status: None   Collection Time: 10/25/19  2:58 AM  Result Value Ref Range   Magnesium 2.2 1.7 - 2.4 mg/dL    Comment: Performed at [facility], [address] [place], [place] [address]  CBC with Differential/Platelet     Status: Abnormal   Collection Time: 10/26/19  4:28 AM  Result Value Ref Range   WBC 6.5 4.0 - 10.5 K/uL   RBC 3.13 (L) 3.87 - 5.11 MIL/uL   Hemoglobin 10.4 (L) 12.0 - 15.0 g/dL   HCT 30.9 (L) 36 - 46 %   MCV 98.7 80.0 - 100.0 fL   MCH 33.2 26.0 - 34.0 pg   MCHC 33.7 30.0 - 36.0 g/dL   RDW 13.8 11.5 - 15.5 %   Platelets 191 150 - 400 K/uL   nRBC 0.0 0.0 - 0.2 %   Neutrophils Relative % 70 %   Neutro Abs 4.6 1.7 - 7.7 K/uL   Lymphocytes Relative 14 %   Lymphs Abs 0.9 0.7 - 4.0 K/uL   Monocytes Relative 11 %   Monocytes Absolute 0.7 0 - 1 K/uL   Eosinophils Relative 3 %   Eosinophils Absolute 0.2 0 - 0 K/uL   Basophils Relative 1 %   Basophils Absolute 0.1 0 - 0 K/uL   Immature Granulocytes 1 %   Abs Immature Granulocytes 0.03 0.00 - 0.07 K/uL    Comment: Performed at [facility], [address] [place], [place] [address]  Basic metabolic panel     Status: Abnormal   Collection Time: 10/26/19  4:28 AM  Result Value Ref Range   Sodium 133 (L) 135 - 145 mmol/L   Potassium 4.7 3.5 - 5.1 mmol/L   Chloride 100 98 - 111 mmol/L   CO2 24 22 - 32 mmol/L   Glucose, Bld 115 (H) 70 - 99 mg/dL    Comment: Glucose reference range applies only to samples taken after fasting for at least 8  hours.   BUN 29 (H) 8 - 23 mg/dL   Creatinine, Ser 1.62 (H) 0.44 - 1.00 mg/dL   Calcium 8.2 (L) 8.9 - 10.3 mg/dL   GFR calc non Af Amer 28 (L) >60 mL/min   GFR calc Af Amer 33 (L) >60 mL/min   Anion gap 9 5 - 15    Comment: Performed at [facility], [address] [place], [place] [address]  Magnesium     Status: None   Collection Time: 10/26/19  4:28 AM  Result Value Ref Range   Magnesium 2.1 1.7 - 2.4 mg/dL    Comment: Performed at [facility], [address] [place],  [address]  CBC with Differential/Platelet     Status: Abnormal   Collection Time: 10/27/19  4:52 AM  Result Value Ref Range  WBC 5.8 4.0 - 10.5 K/uL   RBC 3.11 (L) 3.87 - 5.11 MIL/uL   Hemoglobin 10.5 (L) 12.0 - 15.0 g/dL   HCT 30.2 (L) 36 - 46 %   MCV 97.1 80.0 - 100.0 fL   MCH 33.8 26.0 - 34.0 pg   MCHC 34.8 30.0 - 36.0 g/dL   RDW 13.6 11.5 - 15.5 %   Platelets 192 150 - 400 K/uL   nRBC 0.0 0.0 - 0.2 %   Neutrophils Relative % 76 %   Neutro Abs 4.4 1.7 - 7.7 K/uL   Lymphocytes Relative 10 %   Lymphs Abs 0.6 (L) 0.7 - 4.0 K/uL   Monocytes Relative 9 %   Monocytes Absolute 0.5 0 - 1 K/uL   Eosinophils Relative 3 %   Eosinophils Absolute 0.2 0 - 0 K/uL   Basophils Relative 1 %   Basophils Absolute 0.0 0 - 0 K/uL   Immature Granulocytes 1 %   Abs Immature Granulocytes 0.03 0.00 - 0.07 K/uL    Comment: Performed at [facility], [address], [place], [place] [address]  Basic metabolic panel     Status: Abnormal   Collection Time: 10/27/19  4:52 AM  Result Value Ref Range   Sodium 133 (L) 135 - 145 mmol/L   Potassium 4.4 3.5 - 5.1 mmol/L   Chloride 98 98 - 111 mmol/L   CO2 26 22 - 32 mmol/L   Glucose, Bld 106 (H) 70 - 99 mg/dL    Comment: Glucose reference range applies only to samples taken after fasting for at least 8 hours.   BUN 27 (H) 8 - 23 mg/dL   Creatinine, Ser 1.29 (H) 0.44 - 1.00 mg/dL   Calcium 8.5 (L) 8.9 - 10.3 mg/dL   GFR calc non Af Amer 37  (L) >60 mL/min   GFR calc Af Amer 43 (L) >60 mL/min   Anion gap 9 5 - 15    Comment: Performed at [facility], [address] [place], [place] [address]  Magnesium     Status: None   Collection Time: 10/27/19  4:52 AM  Result Value Ref Range   Magnesium 2.1 1.7 - 2.4 mg/dL    Comment: Performed at [facility], [address] [place], [place] [address]  Renal function panel     Status: Abnormal   Collection Time: 11/16/19  4:25 PM  Result Value Ref Range   Sodium 139 135 - 145 mEq/L   Potassium 4.2 3.5 - 5.1 mEq/L   Chloride 105 96 - 112 mEq/L   CO2 21 19 - 32 mEq/L   Albumin 4.2 3.5 - 5.2 g/dL   BUN 12 6 - 23 mg/dL   Creatinine, Ser 1.17 0.40 - 1.20 mg/dL   Glucose, Bld 98 70 - 99 mg/dL   Phosphorus 4.0 2.3 - 4.6 mg/dL   GFR 43.61 (L) >60.00 mL/min   Calcium 9.7 8.4 - 10.5 mg/dL  CBC with Differential/Platelet     Status: None   Collection Time: 11/16/19  4:25 PM  Result Value Ref Range   WBC 6.3 4.0 - 10.5 K/uL   RBC 4.07 3.87 - 5.11 Mil/uL   Hemoglobin 13.3 12.0 - 15.0 g/dL   HCT 40.2 36 - 46 %   MCV 98.9 78.0 - 100.0 fl   MCHC 33.0 30.0 - 36.0 g/dL   RDW 15.0 11.5 - 15.5 %   Platelets 301.0 150 - 400 K/uL   Neutrophils Relative % 66.2 43 -  77 %   Lymphocytes Relative 20.5 12 - 46 %   Monocytes Relative 9.8 3 - 12 %   Eosinophils Relative 1.9 0 - 5 %   Basophils Relative 1.6 0 - 3 %   Neutro Abs 4.1 1.4 - 7.7 K/uL   Lymphs Abs 1.3 0.7 - 4.0 K/uL   Monocytes Absolute 0.6 0 - 1 K/uL   Eosinophils Absolute 0.1 0 - 0 K/uL   Basophils Absolute 0.1 0 - 0 K/uL  Vitamin B12     Status: None   Collection Time: 11/16/19  4:25 PM  Result Value Ref Range   Vitamin B-12 277 211 - 911 pg/mL    Radiology MR FOOT RIGHT W WO CONTRAST  Result Date: 11/02/2019 CLINICAL DATA:  Nonhealing wound at fifth metatarsal head EXAM: MRI OF THE RIGHT FOREFOOT WITHOUT AND WITH CONTRAST TECHNIQUE: Multiplanar, multisequence MR imaging of the right forefoot was performed  before and after the administration of intravenous contrast. CONTRAST:  76mL GADAVIST GADOBUTROL 1 MMOL/ML IV SOLN COMPARISON:  X-ray 10/23/2019 FINDINGS: Bones/Joint/Cartilage Faint bone marrow edema within the lateral aspect of the fifth metatarsal head (series 11, image 15) with preservation of the fatty T1 bone marrow signal. No definite bony erosion or cortical destruction. Bone marrow signal of the remaining forefoot is within normal limits. No acute fracture. No dislocation. No joint effusion. Ligaments Intact Lisfranc ligament. Collateral ligaments of the forefoot appear intact. Muscles and Tendons Mild atrophy of the intrinsic foot musculature. Intact flexor and extensor tendons. No tenosynovitis. Soft tissues Soft tissue ulceration overlying the lateral aspect of the fifth metatarsal head with underlying edematous enhancing soft tissues which extend over the dorsal aspect of the fifth metatarsal head and distal metaphysis (series 14, image 26). No organized or rim enhancing fluid collection. IMPRESSION: 1. Soft tissue ulceration overlying the lateral aspect of the fifth metatarsal head with local cellulitis. No abscess. 2. Faint bone marrow edema within the lateral aspect of the fifth metatarsal head with preservation of the fatty bone marrow signal. Findings may reflect a reactive osteitis. Early acute osteomyelitis not excluded. These results will be called to the ordering clinician or representative by the Radiologist Assistant, and communication documented in the PACS or [employer]. Electronically Signed   By: Davina Poke D.O.   On: 11/02/2019 08:23   VAS [place] ABI WITH/WO TBI  Result Date: 11/23/2019 LOWER EXTREMITY DOPPLER STUDY Indications: Peripheral artery disease.  Vascular Interventions: 08/02/19 rt PTA of calf vessels. Comparison Study: 08/24/2019 Performing Technologist: Almira Coaster RVS  Examination Guidelines: A complete evaluation includes at minimum, Doppler waveform signals  and systolic blood pressure reading at the level of bilateral brachial, anterior tibial, and posterior tibial arteries, when vessel segments are accessible. Bilateral testing is considered an integral part of a complete examination. Photoelectric Plethysmograph (PPG) waveforms and toe systolic pressure readings are included as required and additional duplex testing as needed. Limited examinations for reoccurring indications may be performed as noted.  ABI Findings: +---------+------------------+-----+--------+--------+ Right    Rt Pressure (mmHg)IndexWaveformComment  +---------+------------------+-----+--------+--------+ Brachial 173                                     +---------+------------------+-----+--------+--------+ ATA      198               1.08                  +---------+------------------+-----+--------+--------+  PTA      162               0.88                  +---------+------------------+-----+--------+--------+ Great Toe89                0.48 Abnormal         +---------+------------------+-----+--------+--------+ +---------+------------------+-----+--------+-------+ Left     Lt Pressure (mmHg)IndexWaveformComment +---------+------------------+-----+--------+-------+ Brachial 184                                    +---------+------------------+-----+--------+-------+ ATA      197               1.07                 +---------+------------------+-----+--------+-------+ PTA      223               1.21                 +---------+------------------+-----+--------+-------+ Great Toe99                0.54 Abnormal        +---------+------------------+-----+--------+-------+ +-------+-----------+-----------+------------+------------+ ABI/TBIToday's ABIToday's TBIPrevious ABIPrevious TBI +-------+-----------+-----------+------------+------------+ Right  1.08       .48        1.30        .61           +-------+-----------+-----------+------------+------------+ Left   1.21       .54        1.31        .58          +-------+-----------+-----------+------------+------------+ Bilateral ABIs appear essentially unchanged compared to prior study on 08/24/2019. Right TBIs appear decreased compared to prior study on 08/24/2019. Left TBIs appear essentially unchanged compared to prior study on 08/24/2019.  Summary: Right: Resting right ankle-brachial index is within normal range. No evidence of significant right lower extremity arterial disease. The right toe-brachial index is abnormal. Left: Resting left ankle-brachial index is within normal range. No evidence of significant left lower extremity arterial disease. The left toe-brachial index is abnormal.  *See table(s) above for measurements and observations.  Electronically signed by Leotis Pain MD on 11/23/2019 at 5:14:42 PM.    Final     Assessment/Plan Persistent atrial fibrillation Rate controlled on Pacerone  Essential hypertension blood pressure control important in reducing the progression of atherosclerotic disease. On appropriate oral medications.  PAD (peripheral artery disease) (HCC) Her ABIs today are 1.08 on the right and 1.21 on the left with digital pressures of 89 on the right and 99 on the left.  Symptomatically doing better.  I want to stretch her out 67-month follow-up intervals at this point.  Continue current medical regimen.    Leotis Pain, MD  11/23/2019 5:36 PM    This note was created with Dragon medical transcription system.  Any errors from dictation are purely unintentional

## 2019-11-24 ENCOUNTER — Telehealth: Payer: Self-pay

## 2019-11-24 DIAGNOSIS — I2510 Atherosclerotic heart disease of native coronary artery without angina pectoris: Secondary | ICD-10-CM | POA: Diagnosis not present

## 2019-11-24 DIAGNOSIS — I4891 Unspecified atrial fibrillation: Secondary | ICD-10-CM | POA: Diagnosis not present

## 2019-11-24 DIAGNOSIS — I739 Peripheral vascular disease, unspecified: Secondary | ICD-10-CM | POA: Diagnosis not present

## 2019-11-24 DIAGNOSIS — Z9181 History of falling: Secondary | ICD-10-CM | POA: Diagnosis not present

## 2019-11-24 DIAGNOSIS — G2581 Restless legs syndrome: Secondary | ICD-10-CM | POA: Diagnosis not present

## 2019-11-24 DIAGNOSIS — M5136 Other intervertebral disc degeneration, lumbar region: Secondary | ICD-10-CM | POA: Diagnosis not present

## 2019-11-24 DIAGNOSIS — E538 Deficiency of other specified B group vitamins: Secondary | ICD-10-CM | POA: Diagnosis not present

## 2019-11-24 DIAGNOSIS — I872 Venous insufficiency (chronic) (peripheral): Secondary | ICD-10-CM | POA: Diagnosis not present

## 2019-11-24 DIAGNOSIS — G9009 Other idiopathic peripheral autonomic neuropathy: Secondary | ICD-10-CM | POA: Diagnosis not present

## 2019-11-24 DIAGNOSIS — I272 Pulmonary hypertension, unspecified: Secondary | ICD-10-CM | POA: Diagnosis not present

## 2019-11-24 DIAGNOSIS — E559 Vitamin D deficiency, unspecified: Secondary | ICD-10-CM | POA: Diagnosis not present

## 2019-11-24 DIAGNOSIS — M810 Age-related osteoporosis without current pathological fracture: Secondary | ICD-10-CM | POA: Diagnosis not present

## 2019-11-24 DIAGNOSIS — L97513 Non-pressure chronic ulcer of other part of right foot with necrosis of muscle: Secondary | ICD-10-CM | POA: Diagnosis not present

## 2019-11-24 DIAGNOSIS — S62102D Fracture of unspecified carpal bone, left wrist, subsequent encounter for fracture with routine healing: Secondary | ICD-10-CM | POA: Diagnosis not present

## 2019-11-24 DIAGNOSIS — I5032 Chronic diastolic (congestive) heart failure: Secondary | ICD-10-CM | POA: Diagnosis not present

## 2019-11-24 DIAGNOSIS — K219 Gastro-esophageal reflux disease without esophagitis: Secondary | ICD-10-CM | POA: Diagnosis not present

## 2019-11-24 DIAGNOSIS — Z7952 Long term (current) use of systemic steroids: Secondary | ICD-10-CM | POA: Diagnosis not present

## 2019-11-24 DIAGNOSIS — E7849 Other hyperlipidemia: Secondary | ICD-10-CM | POA: Diagnosis not present

## 2019-11-24 DIAGNOSIS — Z7901 Long term (current) use of anticoagulants: Secondary | ICD-10-CM | POA: Diagnosis not present

## 2019-11-24 DIAGNOSIS — M1990 Unspecified osteoarthritis, unspecified site: Secondary | ICD-10-CM | POA: Diagnosis not present

## 2019-11-24 DIAGNOSIS — I130 Hypertensive heart and chronic kidney disease with heart failure and stage 1 through stage 4 chronic kidney disease, or unspecified chronic kidney disease: Secondary | ICD-10-CM | POA: Diagnosis not present

## 2019-11-24 DIAGNOSIS — N1831 Chronic kidney disease, stage 3a: Secondary | ICD-10-CM | POA: Diagnosis not present

## 2019-11-24 DIAGNOSIS — Z7982 Long term (current) use of aspirin: Secondary | ICD-10-CM | POA: Diagnosis not present

## 2019-11-24 NOTE — Telephone Encounter (Signed)
Please talk to me about this tomorrow.  Thanks.

## 2019-11-24 NOTE — Telephone Encounter (Signed)
Latest labs ok to proceed with prolia - please schedule. Thank you.

## 2019-11-24 NOTE — Telephone Encounter (Signed)
Will you address in Dr. Synthia Innocent absence?  Received faxed Detailed Written Order- Wound Care.  Placed order in Dr. Josefine Class box.

## 2019-11-25 NOTE — Telephone Encounter (Signed)
Form signed. Thanks!

## 2019-11-25 NOTE — Telephone Encounter (Signed)
Order faxed.

## 2019-11-25 NOTE — Telephone Encounter (Signed)
Noted  

## 2019-11-26 DIAGNOSIS — Z9181 History of falling: Secondary | ICD-10-CM | POA: Diagnosis not present

## 2019-11-26 DIAGNOSIS — K219 Gastro-esophageal reflux disease without esophagitis: Secondary | ICD-10-CM | POA: Diagnosis not present

## 2019-11-26 DIAGNOSIS — I5032 Chronic diastolic (congestive) heart failure: Secondary | ICD-10-CM | POA: Diagnosis not present

## 2019-11-26 DIAGNOSIS — G9009 Other idiopathic peripheral autonomic neuropathy: Secondary | ICD-10-CM | POA: Diagnosis not present

## 2019-11-26 DIAGNOSIS — N1831 Chronic kidney disease, stage 3a: Secondary | ICD-10-CM | POA: Diagnosis not present

## 2019-11-26 DIAGNOSIS — I739 Peripheral vascular disease, unspecified: Secondary | ICD-10-CM | POA: Diagnosis not present

## 2019-11-26 DIAGNOSIS — E538 Deficiency of other specified B group vitamins: Secondary | ICD-10-CM | POA: Diagnosis not present

## 2019-11-26 DIAGNOSIS — I130 Hypertensive heart and chronic kidney disease with heart failure and stage 1 through stage 4 chronic kidney disease, or unspecified chronic kidney disease: Secondary | ICD-10-CM | POA: Diagnosis not present

## 2019-11-26 DIAGNOSIS — S62102D Fracture of unspecified carpal bone, left wrist, subsequent encounter for fracture with routine healing: Secondary | ICD-10-CM | POA: Diagnosis not present

## 2019-11-26 DIAGNOSIS — E7849 Other hyperlipidemia: Secondary | ICD-10-CM | POA: Diagnosis not present

## 2019-11-26 DIAGNOSIS — Z7901 Long term (current) use of anticoagulants: Secondary | ICD-10-CM | POA: Diagnosis not present

## 2019-11-26 DIAGNOSIS — I272 Pulmonary hypertension, unspecified: Secondary | ICD-10-CM | POA: Diagnosis not present

## 2019-11-26 DIAGNOSIS — G2581 Restless legs syndrome: Secondary | ICD-10-CM | POA: Diagnosis not present

## 2019-11-26 DIAGNOSIS — Z7982 Long term (current) use of aspirin: Secondary | ICD-10-CM | POA: Diagnosis not present

## 2019-11-26 DIAGNOSIS — Z7952 Long term (current) use of systemic steroids: Secondary | ICD-10-CM | POA: Diagnosis not present

## 2019-11-26 DIAGNOSIS — M5136 Other intervertebral disc degeneration, lumbar region: Secondary | ICD-10-CM | POA: Diagnosis not present

## 2019-11-26 DIAGNOSIS — I872 Venous insufficiency (chronic) (peripheral): Secondary | ICD-10-CM | POA: Diagnosis not present

## 2019-11-26 DIAGNOSIS — I4891 Unspecified atrial fibrillation: Secondary | ICD-10-CM | POA: Diagnosis not present

## 2019-11-26 DIAGNOSIS — L97513 Non-pressure chronic ulcer of other part of right foot with necrosis of muscle: Secondary | ICD-10-CM | POA: Diagnosis not present

## 2019-11-26 DIAGNOSIS — M810 Age-related osteoporosis without current pathological fracture: Secondary | ICD-10-CM | POA: Diagnosis not present

## 2019-11-26 DIAGNOSIS — E559 Vitamin D deficiency, unspecified: Secondary | ICD-10-CM | POA: Diagnosis not present

## 2019-11-26 DIAGNOSIS — I2510 Atherosclerotic heart disease of native coronary artery without angina pectoris: Secondary | ICD-10-CM | POA: Diagnosis not present

## 2019-11-26 DIAGNOSIS — M1990 Unspecified osteoarthritis, unspecified site: Secondary | ICD-10-CM | POA: Diagnosis not present

## 2019-11-26 NOTE — Telephone Encounter (Signed)
Contacted [name] and advised ok for Prolia. Pt has apt on 9/9 for B12 so she will also get her Prolia at the same visit.

## 2019-11-29 DIAGNOSIS — L97513 Non-pressure chronic ulcer of other part of right foot with necrosis of muscle: Secondary | ICD-10-CM | POA: Diagnosis not present

## 2019-11-29 DIAGNOSIS — I5032 Chronic diastolic (congestive) heart failure: Secondary | ICD-10-CM | POA: Diagnosis not present

## 2019-11-29 DIAGNOSIS — Z7901 Long term (current) use of anticoagulants: Secondary | ICD-10-CM | POA: Diagnosis not present

## 2019-11-29 DIAGNOSIS — I739 Peripheral vascular disease, unspecified: Secondary | ICD-10-CM | POA: Diagnosis not present

## 2019-11-29 DIAGNOSIS — G9009 Other idiopathic peripheral autonomic neuropathy: Secondary | ICD-10-CM | POA: Diagnosis not present

## 2019-11-29 DIAGNOSIS — E7849 Other hyperlipidemia: Secondary | ICD-10-CM | POA: Diagnosis not present

## 2019-11-29 DIAGNOSIS — I130 Hypertensive heart and chronic kidney disease with heart failure and stage 1 through stage 4 chronic kidney disease, or unspecified chronic kidney disease: Secondary | ICD-10-CM | POA: Diagnosis not present

## 2019-11-29 DIAGNOSIS — E559 Vitamin D deficiency, unspecified: Secondary | ICD-10-CM | POA: Diagnosis not present

## 2019-11-29 DIAGNOSIS — I272 Pulmonary hypertension, unspecified: Secondary | ICD-10-CM | POA: Diagnosis not present

## 2019-11-29 DIAGNOSIS — I872 Venous insufficiency (chronic) (peripheral): Secondary | ICD-10-CM | POA: Diagnosis not present

## 2019-11-29 DIAGNOSIS — G2581 Restless legs syndrome: Secondary | ICD-10-CM | POA: Diagnosis not present

## 2019-11-29 DIAGNOSIS — M5136 Other intervertebral disc degeneration, lumbar region: Secondary | ICD-10-CM | POA: Diagnosis not present

## 2019-11-29 DIAGNOSIS — Z7952 Long term (current) use of systemic steroids: Secondary | ICD-10-CM | POA: Diagnosis not present

## 2019-11-29 DIAGNOSIS — K219 Gastro-esophageal reflux disease without esophagitis: Secondary | ICD-10-CM | POA: Diagnosis not present

## 2019-11-29 DIAGNOSIS — S62102D Fracture of unspecified carpal bone, left wrist, subsequent encounter for fracture with routine healing: Secondary | ICD-10-CM | POA: Diagnosis not present

## 2019-11-29 DIAGNOSIS — Z9181 History of falling: Secondary | ICD-10-CM | POA: Diagnosis not present

## 2019-11-29 DIAGNOSIS — N1831 Chronic kidney disease, stage 3a: Secondary | ICD-10-CM | POA: Diagnosis not present

## 2019-11-29 DIAGNOSIS — I2510 Atherosclerotic heart disease of native coronary artery without angina pectoris: Secondary | ICD-10-CM | POA: Diagnosis not present

## 2019-11-29 DIAGNOSIS — E538 Deficiency of other specified B group vitamins: Secondary | ICD-10-CM | POA: Diagnosis not present

## 2019-11-29 DIAGNOSIS — I4891 Unspecified atrial fibrillation: Secondary | ICD-10-CM | POA: Diagnosis not present

## 2019-11-29 DIAGNOSIS — Z7982 Long term (current) use of aspirin: Secondary | ICD-10-CM | POA: Diagnosis not present

## 2019-11-29 DIAGNOSIS — M810 Age-related osteoporosis without current pathological fracture: Secondary | ICD-10-CM | POA: Diagnosis not present

## 2019-11-29 DIAGNOSIS — M1990 Unspecified osteoarthritis, unspecified site: Secondary | ICD-10-CM | POA: Diagnosis not present

## 2019-11-30 DIAGNOSIS — L97513 Non-pressure chronic ulcer of other part of right foot with necrosis of muscle: Secondary | ICD-10-CM | POA: Diagnosis not present

## 2019-11-30 DIAGNOSIS — M5136 Other intervertebral disc degeneration, lumbar region: Secondary | ICD-10-CM | POA: Diagnosis not present

## 2019-11-30 DIAGNOSIS — I272 Pulmonary hypertension, unspecified: Secondary | ICD-10-CM | POA: Diagnosis not present

## 2019-11-30 DIAGNOSIS — K219 Gastro-esophageal reflux disease without esophagitis: Secondary | ICD-10-CM | POA: Diagnosis not present

## 2019-11-30 DIAGNOSIS — G9009 Other idiopathic peripheral autonomic neuropathy: Secondary | ICD-10-CM | POA: Diagnosis not present

## 2019-11-30 DIAGNOSIS — I872 Venous insufficiency (chronic) (peripheral): Secondary | ICD-10-CM | POA: Diagnosis not present

## 2019-11-30 DIAGNOSIS — G2581 Restless legs syndrome: Secondary | ICD-10-CM | POA: Diagnosis not present

## 2019-11-30 DIAGNOSIS — Z7982 Long term (current) use of aspirin: Secondary | ICD-10-CM | POA: Diagnosis not present

## 2019-11-30 DIAGNOSIS — I2510 Atherosclerotic heart disease of native coronary artery without angina pectoris: Secondary | ICD-10-CM | POA: Diagnosis not present

## 2019-11-30 DIAGNOSIS — M1990 Unspecified osteoarthritis, unspecified site: Secondary | ICD-10-CM | POA: Diagnosis not present

## 2019-11-30 DIAGNOSIS — E559 Vitamin D deficiency, unspecified: Secondary | ICD-10-CM | POA: Diagnosis not present

## 2019-11-30 DIAGNOSIS — M810 Age-related osteoporosis without current pathological fracture: Secondary | ICD-10-CM | POA: Diagnosis not present

## 2019-11-30 DIAGNOSIS — Z9181 History of falling: Secondary | ICD-10-CM | POA: Diagnosis not present

## 2019-11-30 DIAGNOSIS — E7849 Other hyperlipidemia: Secondary | ICD-10-CM | POA: Diagnosis not present

## 2019-11-30 DIAGNOSIS — S62102D Fracture of unspecified carpal bone, left wrist, subsequent encounter for fracture with routine healing: Secondary | ICD-10-CM | POA: Diagnosis not present

## 2019-11-30 DIAGNOSIS — I739 Peripheral vascular disease, unspecified: Secondary | ICD-10-CM | POA: Diagnosis not present

## 2019-11-30 DIAGNOSIS — E538 Deficiency of other specified B group vitamins: Secondary | ICD-10-CM | POA: Diagnosis not present

## 2019-11-30 DIAGNOSIS — I130 Hypertensive heart and chronic kidney disease with heart failure and stage 1 through stage 4 chronic kidney disease, or unspecified chronic kidney disease: Secondary | ICD-10-CM | POA: Diagnosis not present

## 2019-11-30 DIAGNOSIS — N1831 Chronic kidney disease, stage 3a: Secondary | ICD-10-CM | POA: Diagnosis not present

## 2019-11-30 DIAGNOSIS — Z7901 Long term (current) use of anticoagulants: Secondary | ICD-10-CM | POA: Diagnosis not present

## 2019-11-30 DIAGNOSIS — I4891 Unspecified atrial fibrillation: Secondary | ICD-10-CM | POA: Diagnosis not present

## 2019-11-30 DIAGNOSIS — Z7952 Long term (current) use of systemic steroids: Secondary | ICD-10-CM | POA: Diagnosis not present

## 2019-11-30 DIAGNOSIS — I5032 Chronic diastolic (congestive) heart failure: Secondary | ICD-10-CM | POA: Diagnosis not present

## 2019-12-01 ENCOUNTER — Encounter: Payer: Medicare Other | Admitting: Internal Medicine

## 2019-12-01 ENCOUNTER — Other Ambulatory Visit: Payer: Self-pay

## 2019-12-01 ENCOUNTER — Telehealth: Payer: Self-pay

## 2019-12-01 DIAGNOSIS — G2581 Restless legs syndrome: Secondary | ICD-10-CM | POA: Diagnosis not present

## 2019-12-01 DIAGNOSIS — K219 Gastro-esophageal reflux disease without esophagitis: Secondary | ICD-10-CM | POA: Diagnosis not present

## 2019-12-01 DIAGNOSIS — Z9181 History of falling: Secondary | ICD-10-CM | POA: Diagnosis not present

## 2019-12-01 DIAGNOSIS — L97519 Non-pressure chronic ulcer of other part of right foot with unspecified severity: Secondary | ICD-10-CM

## 2019-12-01 DIAGNOSIS — I4891 Unspecified atrial fibrillation: Secondary | ICD-10-CM | POA: Diagnosis not present

## 2019-12-01 DIAGNOSIS — E7849 Other hyperlipidemia: Secondary | ICD-10-CM | POA: Diagnosis not present

## 2019-12-01 DIAGNOSIS — S62102D Fracture of unspecified carpal bone, left wrist, subsequent encounter for fracture with routine healing: Secondary | ICD-10-CM | POA: Diagnosis not present

## 2019-12-01 DIAGNOSIS — B353 Tinea pedis: Secondary | ICD-10-CM | POA: Diagnosis not present

## 2019-12-01 DIAGNOSIS — E559 Vitamin D deficiency, unspecified: Secondary | ICD-10-CM | POA: Diagnosis not present

## 2019-12-01 DIAGNOSIS — M810 Age-related osteoporosis without current pathological fracture: Secondary | ICD-10-CM | POA: Diagnosis not present

## 2019-12-01 DIAGNOSIS — I2510 Atherosclerotic heart disease of native coronary artery without angina pectoris: Secondary | ICD-10-CM | POA: Diagnosis not present

## 2019-12-01 DIAGNOSIS — I480 Paroxysmal atrial fibrillation: Secondary | ICD-10-CM | POA: Diagnosis not present

## 2019-12-01 DIAGNOSIS — Z880 Allergy status to penicillin: Secondary | ICD-10-CM | POA: Diagnosis not present

## 2019-12-01 DIAGNOSIS — I5032 Chronic diastolic (congestive) heart failure: Secondary | ICD-10-CM | POA: Diagnosis not present

## 2019-12-01 DIAGNOSIS — Z7952 Long term (current) use of systemic steroids: Secondary | ICD-10-CM | POA: Diagnosis not present

## 2019-12-01 DIAGNOSIS — I70235 Atherosclerosis of native arteries of right leg with ulceration of other part of foot: Secondary | ICD-10-CM | POA: Diagnosis not present

## 2019-12-01 DIAGNOSIS — I272 Pulmonary hypertension, unspecified: Secondary | ICD-10-CM | POA: Diagnosis not present

## 2019-12-01 DIAGNOSIS — L97512 Non-pressure chronic ulcer of other part of right foot with fat layer exposed: Secondary | ICD-10-CM | POA: Diagnosis not present

## 2019-12-01 DIAGNOSIS — I872 Venous insufficiency (chronic) (peripheral): Secondary | ICD-10-CM | POA: Diagnosis not present

## 2019-12-01 DIAGNOSIS — I7389 Other specified peripheral vascular diseases: Secondary | ICD-10-CM | POA: Diagnosis not present

## 2019-12-01 DIAGNOSIS — M5136 Other intervertebral disc degeneration, lumbar region: Secondary | ICD-10-CM | POA: Diagnosis not present

## 2019-12-01 DIAGNOSIS — G9009 Other idiopathic peripheral autonomic neuropathy: Secondary | ICD-10-CM | POA: Diagnosis not present

## 2019-12-01 DIAGNOSIS — E538 Deficiency of other specified B group vitamins: Secondary | ICD-10-CM | POA: Diagnosis not present

## 2019-12-01 DIAGNOSIS — M1990 Unspecified osteoarthritis, unspecified site: Secondary | ICD-10-CM | POA: Diagnosis not present

## 2019-12-01 DIAGNOSIS — Z7901 Long term (current) use of anticoagulants: Secondary | ICD-10-CM | POA: Diagnosis not present

## 2019-12-01 DIAGNOSIS — I739 Peripheral vascular disease, unspecified: Secondary | ICD-10-CM | POA: Diagnosis not present

## 2019-12-01 DIAGNOSIS — N1831 Chronic kidney disease, stage 3a: Secondary | ICD-10-CM | POA: Diagnosis not present

## 2019-12-01 DIAGNOSIS — L97513 Non-pressure chronic ulcer of other part of right foot with necrosis of muscle: Secondary | ICD-10-CM | POA: Diagnosis not present

## 2019-12-01 DIAGNOSIS — Z7982 Long term (current) use of aspirin: Secondary | ICD-10-CM | POA: Diagnosis not present

## 2019-12-01 DIAGNOSIS — I130 Hypertensive heart and chronic kidney disease with heart failure and stage 1 through stage 4 chronic kidney disease, or unspecified chronic kidney disease: Secondary | ICD-10-CM | POA: Diagnosis not present

## 2019-12-01 NOTE — Telephone Encounter (Signed)
Received faxed Detailed Written Order for Wound Care.  Placed form in Dr. Synthia Innocent box.

## 2019-12-02 DIAGNOSIS — N1831 Chronic kidney disease, stage 3a: Secondary | ICD-10-CM | POA: Diagnosis not present

## 2019-12-02 DIAGNOSIS — M5136 Other intervertebral disc degeneration, lumbar region: Secondary | ICD-10-CM | POA: Diagnosis not present

## 2019-12-02 DIAGNOSIS — K219 Gastro-esophageal reflux disease without esophagitis: Secondary | ICD-10-CM | POA: Diagnosis not present

## 2019-12-02 DIAGNOSIS — G2581 Restless legs syndrome: Secondary | ICD-10-CM | POA: Diagnosis not present

## 2019-12-02 DIAGNOSIS — E7849 Other hyperlipidemia: Secondary | ICD-10-CM | POA: Diagnosis not present

## 2019-12-02 DIAGNOSIS — S62102D Fracture of unspecified carpal bone, left wrist, subsequent encounter for fracture with routine healing: Secondary | ICD-10-CM | POA: Diagnosis not present

## 2019-12-02 DIAGNOSIS — L97513 Non-pressure chronic ulcer of other part of right foot with necrosis of muscle: Secondary | ICD-10-CM | POA: Diagnosis not present

## 2019-12-02 DIAGNOSIS — I2510 Atherosclerotic heart disease of native coronary artery without angina pectoris: Secondary | ICD-10-CM | POA: Diagnosis not present

## 2019-12-02 DIAGNOSIS — I739 Peripheral vascular disease, unspecified: Secondary | ICD-10-CM | POA: Diagnosis not present

## 2019-12-02 DIAGNOSIS — E559 Vitamin D deficiency, unspecified: Secondary | ICD-10-CM | POA: Diagnosis not present

## 2019-12-02 DIAGNOSIS — Z7952 Long term (current) use of systemic steroids: Secondary | ICD-10-CM | POA: Diagnosis not present

## 2019-12-02 DIAGNOSIS — M810 Age-related osteoporosis without current pathological fracture: Secondary | ICD-10-CM | POA: Diagnosis not present

## 2019-12-02 DIAGNOSIS — Z7982 Long term (current) use of aspirin: Secondary | ICD-10-CM | POA: Diagnosis not present

## 2019-12-02 DIAGNOSIS — I5032 Chronic diastolic (congestive) heart failure: Secondary | ICD-10-CM | POA: Diagnosis not present

## 2019-12-02 DIAGNOSIS — M1990 Unspecified osteoarthritis, unspecified site: Secondary | ICD-10-CM | POA: Diagnosis not present

## 2019-12-02 DIAGNOSIS — I872 Venous insufficiency (chronic) (peripheral): Secondary | ICD-10-CM | POA: Diagnosis not present

## 2019-12-02 DIAGNOSIS — Z9181 History of falling: Secondary | ICD-10-CM | POA: Diagnosis not present

## 2019-12-02 DIAGNOSIS — I4891 Unspecified atrial fibrillation: Secondary | ICD-10-CM | POA: Diagnosis not present

## 2019-12-02 DIAGNOSIS — Z7901 Long term (current) use of anticoagulants: Secondary | ICD-10-CM | POA: Diagnosis not present

## 2019-12-02 DIAGNOSIS — I272 Pulmonary hypertension, unspecified: Secondary | ICD-10-CM | POA: Diagnosis not present

## 2019-12-02 DIAGNOSIS — E538 Deficiency of other specified B group vitamins: Secondary | ICD-10-CM | POA: Diagnosis not present

## 2019-12-02 DIAGNOSIS — G9009 Other idiopathic peripheral autonomic neuropathy: Secondary | ICD-10-CM | POA: Diagnosis not present

## 2019-12-02 DIAGNOSIS — I130 Hypertensive heart and chronic kidney disease with heart failure and stage 1 through stage 4 chronic kidney disease, or unspecified chronic kidney disease: Secondary | ICD-10-CM | POA: Diagnosis not present

## 2019-12-02 NOTE — Progress Notes (Signed)
EMARY, ZALAR ([id number]) Visit Report for 12/01/2019 Debridement Details Patient Name: [name], [name]. Date of Service: 12/01/2019 1:45 PM Medical Record Number: [id number] Patient Account Number: [id number] Date of Birth/Sex: July 18, 1929 (86 y.o. F) Treating RN: Cornell Barman Primary Care Provider: Ria Bush Other Clinician: Referring Provider: Ria Bush Treating Provider/Extender: Tito Dine in Treatment: 7 Debridement Performed for Wound #3 Right Metatarsal head fifth Assessment: Performed By: Physician Ricard Dillon, MD Debridement Type: Debridement Severity of Tissue Pre Debridement: Fat layer exposed Level of Consciousness (Pre- Awake and Alert procedure): Pre-procedure Verification/Time Out Yes - 14:55 Taken: Total Area Debrided (L x W): 1 (cm) x 1 (cm) = 1 (cm) Tissue and other material Viable, Non-Viable, Slough, Subcutaneous, Slough debrided: Level: Skin/Subcutaneous Tissue Debridement Description: Excisional Instrument: Curette Bleeding: Moderate Hemostasis Achieved: Pressure Response to Treatment: Procedure was tolerated well Level of Consciousness (Post- Awake and Alert procedure): Post Debridement Measurements of Total Wound Length: (cm) 1 Width: (cm) 1 Depth: (cm) 0.8 Volume: (cm) 0.628 Character of Wound/Ulcer Post Debridement: Stable Severity of Tissue Post Debridement: Fat layer exposed Post Procedure Diagnosis Same as Pre-procedure Electronic Signature(s) Signed: 12/01/2019 5:45:00 PM By: Gretta Cool, BSN, RN, CWS, Kim RN, BSN Signed: 12/02/2019 4:51:35 PM By: Linton Ham MD Entered By: Gretta Cool, BSN, RN, CWS, Kim on 12/01/2019 14:56:36 JENNICA, TAGLIAFERRI ([id number]) -------------------------------------------------------------------------------- HPI Details Patient Name: [name], [name]. Date of Service: 12/01/2019 1:45 PM Medical Record Number: [id number] Patient Account Number: [id number] Date of Birth/Sex:  05-17-1929 (86 y.o. F) Treating RN: Cornell Barman Primary Care Provider: Ria Bush Other Clinician: Referring Provider: Ria Bush Treating Provider/Extender: Tito Dine in Treatment: 7 History of Present Illness HPI Description: 10/12/2019 upon evaluation today patient appears to be doing somewhat poorly in regard to her wound at this point. She has a ulcer over the right fifth metatarsal region. This has been managed by podiatry up to this point. Unfortunately the patient just does not seem to be making the progress that they were hoping for an subsequently the patient was referred to [place] by [facility] Dr. Posey Pronto. With that being said I did note that the patient does have peripheral vascular disease which has been managed by Dr. Lazaro Arms her most recent angiogram with intervention was on 08/02/2019. The ABIs following were on the right 1.30 with a TBI of 0.61 normal left 1.31 with a TBI of 0.58. Obviously things seem to be doing somewhat better at this point hopefully enough to allow her to heal. She is on anticoagulant therapy due to atrial fibrillation long-term. With that being said the patient is unfortunately having quite a bit of discomfort at this time secondary to the wound. She also has erythema which is very likely secondary to infection. I am going obtain a culture of the area today once debridement is complete. Apparently the patient also had a x-ray that was performed by Dr. Posey Pronto on 09/21/2019 and this showed that the patient had no obvious signs on x-ray of cortical irregularity and no osteomyelitis obviously noted. With that being said as I discussed with the patient's daughter as well this does not indicate that the patient absolutely has no osteomyelitis as x-rays are only at best 50% helpful in identifying this complication. Nonetheless I do believe that we need to have the patient continue to have this further evaluated specifically I am to  recommend that we check into an MRI which I think would be ideal to evaluate whether or not there is  osteomyelitis present. 7/16; this is a patient with an ulcer over the right fifth metatarsal head. She [facility] with [facility] She is due to have an MRI of the foot on 7/26. Using Santyl to the wound 11/04/2019 on evaluation today patient appears to be doing okay in regard to her wound at this time. Unfortunately since I last saw her which was actually her second visit here in the clinic she has moved into a new apartment and then subsequently had a fall. She ended up in the hospital she did fracture her left arm and subsequently has also been dealing with a lot of dizziness she has been at [employer]. She tells me that getting in the change the dressing has been somewhat difficult as far as daily dressing changes are concerned with the Santyl. Nonetheless when they do change it that is what is been used and her daughter-in-law has also been coming in to help out as well with the dressing changes to try to make sure it gets done daily. Fortunately there is no signs of active infection at this time. No fever chills noted. I do believe the Santyl has been beneficial and is likely still the best way to go at this point for the patient. I did review the patient's MRI which showed some potential for reactive osteitis but did not show any signs of osteomyelitis overtly. That cannot be excluded but again right now it does not appear that is very likely present as well. This is good news. I also did review her culture she has been on the Bactrim she tells me she has taken this and again that was appropriate based on the results of her culture. 11/19/2019 upon evaluation today patient appears to be doing decently well in regard to her wound. Fortunately there is no signs of active infection at this time wound is not significantly larger in fact is roughly about the same at this point.  With that being said there is not as much erythema as previously noted I think the infection seems to be under much better control which is great news. Unfortunately she has fallen since I last saw her and broken her left arm she has an appointment with them following the appointment with me. 8/25; patient was worked in acutely today at the request of home health and was concerned about her right heel, generalized erythema in her feet bilaterally. According to her daughter things have been getting worse over the last for 5 days but not precisely from a wound care point of view. Her wound is on the lateral part of the right fifth metatarsal head looks about the same as I remember this. She has developed dryness and flaking of the skin on her plantar feet. As well a very tender area localized over the insertion point of the Achilles tendon. She has restless leg syndrome. Does not specifically offload the area at night. She wears socks at home and does not really give me a pressure on the right heel issue. I reviewed her vascular status. She underwent her last revascularization on 08/02/2019 by Dr. Lucky Cowboy. This included an angioplasty of the right peroneal artery as well as the right posterior tibial artery. Her last noninvasive arterial study was on 11/23/2019. On the right her ABI was 1.08 TBI of 0.48. On the left ABI at 1.21 with a TBI of 0.54. Family relates that Dr. Lucky Cowboy felt that he had done the best he could and this. We  have been using Santyl to the one wound she has on the right lateral fifth metatarsal head. She does not have a new wound today which is the concern that letter coming into the clinic Electronic Signature(s) Signed: 12/02/2019 4:51:35 PM By: Linton Ham MD Entered By: Linton Ham on 12/01/2019 15:12:30 [name] ([id number]) -------------------------------------------------------------------------------- Physical Exam Details Patient Name: [name], [name]. Date of  Service: 12/01/2019 1:45 PM Medical Record Number: [id number] Patient Account Number: [id number] Date of Birth/Sex: [birth year] (86 y.o. F) Treating RN: Cornell Barman Primary Care Provider: Ria Bush Other Clinician: Referring Provider: Ria Bush Treating Provider/Extender: Tito Dine in Treatment: 7 Constitutional Patient is hypertensive.. Pulse regular and within target range for patient.Marland Kitchen Respirations regular, non-labored and within target range.. Temperature is normal and within the target range for the patient.Marland Kitchen appears in no distress. Cardiovascular Dorsalis pedis pulses are palpable bilaterally but posterior tibial pulses are difficult to feel.. Musculoskeletal There is no pain with dorsi and plantarflexion of the right ankle. Localized tenderness over the insertion point of the Achilles tendon with fairly marked erythema in the area. Integumentary (Hair, Skin) Erythema in her bilateral feet but nontender there is not much warmth. Dry scaly skin involving her entire plantar feet and toes. Almost denuded epithelium.Marland Kitchen Psychiatric No evidence of depression, anxiety, or agitation. Calm, cooperative, and communicative. Appropriate interactions and affect.. Notes Wound exam; punched-out area on the right lateral fifth metatarsal head. Marked debris over 100% of the surface removed with a #3 curette. Post debridement I am able to get to healthy bleeding tissue but had a fair depth. oI do not see any additional wounds anywhere in her bilateral feet however she has complete scaling loss of epithelialization on the plantar feet and toes. I wonder whether this could be tinea/fungus oFinally she has a localized area of erythema and tenderness over the insertion of the Achilles tendon. There is nothing to suggest a traumatic etiology here. She is able to dorsiflex and plantarflex her foot with ease without undue pain Electronic Signature(s) Signed: 12/02/2019 4:51:35 PM  By: Linton Ham MD Entered By: Linton Ham on 12/01/2019 15:15:26 [name] ([id number]) -------------------------------------------------------------------------------- Physician Orders Details Patient Name: [name]. Date of Service: 12/01/2019 1:45 PM Medical Record Number: [id number] Patient Account Number: [id number] Date of Birth/Sex: [birth year] (86 y.o. F) Treating RN: Cornell Barman Primary Care Provider: Ria Bush Other Clinician: Referring Provider: Ria Bush Treating Provider/Extender: Tito Dine in Treatment: 7 Verbal / Phone Orders: No Diagnosis Coding Wound Cleansing Wound #3 Right Metatarsal head fifth o Clean wound with Normal Saline. - With each dressing change o Other: - wound cleaner can be used as well. Primary Wound Dressing Wound #3 Right Metatarsal head fifth o Santyl Ointment Secondary Dressing Wound #3 Right Metatarsal head fifth o Boardered Foam Dressing Dressing Change Frequency Wound #3 Right Metatarsal head fifth o Change dressing every day. Follow-up Appointments Wound #3 Right Metatarsal head fifth o Return Appointment in 1 week. Edema Control Wound #3 Right Metatarsal head fifth o Elevate legs to the level of the heart and pump ankles as often as possible Off-Loading Wound #3 Right Metatarsal head fifth o Other: - Keep pressure off of the wound, heel cup on right heel Additional Orders / Instructions Wound #3 Right Metatarsal head fifth o Increase protein intake. Medications-please add to medication list. Wound #3 Right Metatarsal head fifth o Santyl Enzymatic Ointment o Other: - fungal cream for feet. Patient Medications Allergies: penicillin Notifications Medication Indication Start  End terbinafine HCl 12/01/2019 DOSE topical 1 % cream - cream topical to bilateral feet bid Electronic Signature(s) DEMMI, SINDT ([id number]) Signed: 12/01/2019 3:21:33 PM By: Linton Ham MD Entered By: Linton Ham on 12/01/2019 15:21:32 DAPHANIE, OQUENDO ([id number]) -------------------------------------------------------------------------------- Problem List Details Patient Name: [name], [name]. Date of Service: 12/01/2019 1:45 PM Medical Record Number: [id number] Patient Account Number: [id number] Date of Birth/Sex: [birth year] (86 y.o. F) Treating RN: Cornell Barman Primary Care Provider: Ria Bush Other Clinician: Referring Provider: Ria Bush Treating Provider/Extender: Tito Dine in Treatment: 7 Active Problems ICD-10 Encounter Code Description Active Date MDM Diagnosis I73.89 Other specified peripheral vascular diseases 10/12/2019 No Yes L97.512 Non-pressure chronic ulcer of other part of right foot with fat layer 10/12/2019 No Yes exposed Z79.01 Long term (current) use of anticoagulants 10/12/2019 No Yes I48.0 Paroxysmal atrial fibrillation 10/12/2019 No Yes B35.3 Tinea pedis 12/01/2019 No Yes Inactive Problems Resolved Problems Electronic Signature(s) Signed: 12/02/2019 4:51:35 PM By: Linton Ham MD Entered By: Linton Ham on 12/01/2019 15:07:58 [name] ([id number]) -------------------------------------------------------------------------------- Progress Note Details Patient Name: [name]. Date of Service: 12/01/2019 1:45 PM Medical Record Number: [id number] Patient Account Number: [id number] Date of Birth/Sex: 07/10/1929 (86 y.o. F) Treating RN: Cornell Barman Primary Care Provider: Ria Bush Other Clinician: Referring Provider: Ria Bush Treating Provider/Extender: Tito Dine in Treatment: 7 Subjective History of Present Illness (HPI) 10/12/2019 upon evaluation today patient appears to be doing somewhat poorly in regard to her wound at this point. She has a ulcer over the right fifth metatarsal region. This has been managed by podiatry up to this point. Unfortunately the  patient just does not seem to be making the progress that they were hoping for an subsequently the patient was referred to [place] by [facility] Dr. Posey Pronto. With that being said I did note that the patient does have peripheral vascular disease which has been managed by Dr. Lazaro Arms her most recent angiogram with intervention was on 08/02/2019. The ABIs following were on the right 1.30 with a TBI of 0.61 normal left 1.31 with a TBI of 0.58. Obviously things seem to be doing somewhat better at this point hopefully enough to allow her to heal. She is on anticoagulant therapy due to atrial fibrillation long-term. With that being said the patient is unfortunately having quite a bit of discomfort at this time secondary to the wound. She also has erythema which is very likely secondary to infection. I am going obtain a culture of the area today once debridement is complete. Apparently the patient also had a x-ray that was performed by Dr. Posey Pronto on 09/21/2019 and this showed that the patient had no obvious signs on x-ray of cortical irregularity and no osteomyelitis obviously noted. With that being said as I discussed with the patient's daughter as well this does not indicate that the patient absolutely has no osteomyelitis as x-rays are only at best 50% helpful in identifying this complication. Nonetheless I do believe that we need to have the patient continue to have this further evaluated specifically I am to recommend that we check into an MRI which I think would be ideal to evaluate whether or not there is osteomyelitis present. 7/16; this is a patient with an ulcer over the right fifth metatarsal head. She follows with Dr[facility]. She is due to have an MRI of the foot on 7/26. Using Santyl to the wound 11/04/2019 on evaluation today patient appears to  be doing okay in regard to her wound at this time. Unfortunately since I last saw her which was actually her second visit here in the  clinic she has moved into a new apartment and then subsequently had a fall. She ended up in the hospital she did fracture her left arm and subsequently has also been dealing with a lot of dizziness she has been at [employer]. She tells me that getting in the change the dressing has been somewhat difficult as far as daily dressing changes are concerned with the Santyl. Nonetheless when they do change it that is what is been used and her daughter-in-law has also been coming in to help out as well with the dressing changes to try to make sure it gets done daily. Fortunately there is no signs of active infection at this time. No fever chills noted. I do believe the Santyl has been beneficial and is likely still the best way to go at this point for the patient. I did review the patient's MRI which showed some potential for reactive osteitis but did not show any signs of osteomyelitis overtly. That cannot be excluded but again right now it does not appear that is very likely present as well. This is good news. I also did review her culture she has been on the Bactrim she tells me she has taken this and again that was appropriate based on the results of her culture. 11/19/2019 upon evaluation today patient appears to be doing decently well in regard to her wound. Fortunately there is no signs of active infection at this time wound is not significantly larger in fact is roughly about the same at this point. With that being said there is not as much erythema as previously noted I think the infection seems to be under much better control which is great news. Unfortunately she has fallen since I last saw her and broken her left arm she has an appointment with them following the appointment with me. 8/25; patient was worked in acutely today at the request of home health and was concerned about her right heel, generalized erythema in her feet bilaterally. According to her daughter things have been getting  worse over the last for 5 days but not precisely from a wound care point of view. Her wound is on the lateral part of the right fifth metatarsal head looks about the same as I remember this. She has developed dryness and flaking of the skin on her plantar feet. As well a very tender area localized over the insertion point of the Achilles tendon. She has restless leg syndrome. Does not specifically offload the area at night. She wears socks at home and does not really give me a pressure on the right heel issue. I reviewed her vascular status. She underwent her last revascularization on 08/02/2019 by Dr. Lucky Cowboy. This included an angioplasty of the right peroneal artery as well as the right posterior tibial artery. Her last noninvasive arterial study was on 11/23/2019. On the right her ABI was 1.08 TBI of 0.48. On the left ABI at 1.21 with a TBI of 0.54. Family relates that Dr. Lucky Cowboy felt that he had done the best he could and this. We have been using Santyl to the one wound she has on the right lateral fifth metatarsal head. She does not have a new wound today which is the concern that letter coming into the clinic Objective Constitutional Patient is hypertensive.. Pulse regular and within target range for patient.Marland Kitchen  Respirations regular, non-labored and within target range.. Temperature is normal and within the target range for the patient.Marland Kitchen appears in no distress. Vitals Time Taken: 2:00 AM, Height: 62 in, Weight: 140 lbs, BMI: 25.6, Temperature: 97.8 F, Pulse: 57 bpm, Respiratory Rate: 18 breaths/min, Blood Pressure: 180/63 mmHg. BRODY, KUMP ([id number]) Cardiovascular Dorsalis pedis pulses are palpable bilaterally but posterior tibial pulses are difficult to feel.. Musculoskeletal There is no pain with dorsi and plantarflexion of the right ankle. Localized tenderness over the insertion point of the Achilles tendon with fairly marked erythema in the area. Psychiatric No evidence of depression,  anxiety, or agitation. Calm, cooperative, and communicative. Appropriate interactions and affect.. General Notes: Wound exam; punched-out area on the right lateral fifth metatarsal head. Marked debris over 100% of the surface removed with a #3 curette. Post debridement I am able to get to healthy bleeding tissue but had a fair depth. I do not see any additional wounds anywhere in her bilateral feet however she has complete scaling loss of epithelialization on the plantar feet and toes. I wonder whether this could be tinea/fungus Finally she has a localized area of erythema and tenderness over the insertion of the Achilles tendon. There is nothing to suggest a traumatic etiology here. She is able to dorsiflex and plantarflex her foot with ease without undue pain Integumentary (Hair, Skin) Erythema in her bilateral feet but nontender there is not much warmth. Dry scaly skin involving her entire plantar feet and toes. Almost denuded epithelium.. Wound #3 status is Open. Original cause of wound was Gradually Appeared. The wound is located on the Right Metatarsal head fifth. The wound measures 1cm length x 1cm width x 0.3cm depth; 0.785cm^2 area and 0.236cm^3 volume. There is Fat Layer (Subcutaneous Tissue) exposed. There is a medium amount of serosanguineous drainage noted. The wound margin is distinct with the outline attached to the wound base. There is small (1-33%) red granulation within the wound bed. There is a large (67-100%) amount of necrotic tissue within the wound bed including Adherent Slough. Assessment Active Problems ICD-10 Other specified peripheral vascular diseases Non-pressure chronic ulcer of other part of right foot with fat layer exposed Long term (current) use of anticoagulants Paroxysmal atrial fibrillation Tinea pedis Procedures Wound #3 Pre-procedure diagnosis of Wound #3 is an Arterial Insufficiency Ulcer located on the Right Metatarsal head fifth .Severity of Tissue  Pre Debridement is: Fat layer exposed. There was a Excisional Skin/Subcutaneous Tissue Debridement with a total area of 1 sq cm performed by Ricard Dillon, MD. With the following instrument(s): Curette to remove Viable and Non-Viable tissue/material. Material removed includes Subcutaneous Tissue and Slough and. No specimens were taken. A time out was conducted at 14:55, prior to the start of the procedure. A Moderate amount of bleeding was controlled with Pressure. The procedure was tolerated well. Post Debridement Measurements: 1cm length x 1cm width x 0.8cm depth; 0.628cm^3 volume. Character of Wound/Ulcer Post Debridement is stable. Severity of Tissue Post Debridement is: Fat layer exposed. Post procedure Diagnosis Wound #3: Same as Pre-Procedure Plan Wound Cleansing: Wound #3 Right Metatarsal head fifth: Clean wound with Normal Saline. - With each dressing change Other: - wound cleaner can be used as well. Primary Wound Dressing: Wound #3 Right Metatarsal head fifth: Santyl Ointment Secondary Dressing: DOMINGA, MCDUFFIE ([id number]) Wound #3 Right Metatarsal head fifth: Boardered Foam Dressing Dressing Change Frequency: Wound #3 Right Metatarsal head fifth: Change dressing every day. Follow-up Appointments: Wound #3 Right Metatarsal head fifth: Return Appointment in  1 week. Edema Control: Wound #3 Right Metatarsal head fifth: Elevate legs to the level of the heart and pump ankles as often as possible Off-Loading: Wound #3 Right Metatarsal head fifth: Other: - Keep pressure off of the wound, heel cup on right heel Additional Orders / Instructions: Wound #3 Right Metatarsal head fifth: Increase protein intake. Medications-please add to medication list.: Wound #3 Right Metatarsal head fifth: Santyl Enzymatic Ointment Other: - fungal cream for feet. The following medication(s) was prescribed: terbinafine HCl topical 1 % cream cream topical to bilateral feet bid starting  12/01/2019 1. Fortunately I think her wound is the only one that we had previously defined. There is no new wounds. I debrided this. I'm able to get to healthy bleeding tissue continue the [facility] for now. 2. Her last noninvasive vascular studies were actually somewhat reassuring 3. The scaling of her skin suggest tinea pedis. I prescribed terbinafine cream twice a day. This may need to have a prolonged course 4. The only area I found somewhat concerning her on the tip of her right heel at the point of insertion of her Achilles tendon. There was no obvious evidence that there was an injury here. She dorsi and plantarflex the ankle without pain nevertheless it was very tender. I wondered about a bursitis. She also has restless leg syndrome and it could be she is putting excess friction on this area. For now all we did was pad this. 5. From a wound care point of view I didn't think there was much new here that was the concern that brought her in acutely. Nevertheless the wound she has is not improving much. Hopefully the aggressive debridement today will help Electronic Signature(s) Signed: 12/02/2019 4:51:35 PM By: Linton Ham MD Entered By: Linton Ham on 12/01/2019 15:28:04 [name] ([id number]) -------------------------------------------------------------------------------- [place] Details Patient Name: [name] Date of Service: 12/01/2019 Medical Record Number: [id number] Patient Account Number: [id number] Date of Birth/Sex: 1929/09/17 (86 y.o. F) Treating RN: Cornell Barman Primary Care Provider: Ria Bush Other Clinician: Referring Provider: Ria Bush Treating Provider/Extender: Tito Dine in Treatment: 7 Diagnosis Coding ICD-10 Codes Code Description I73.89 Other specified peripheral vascular diseases L97.512 Non-pressure chronic ulcer of other part of right foot with fat layer exposed Z79.01 Long term (current) use of  anticoagulants I48.0 Paroxysmal atrial fibrillation B35.3 Tinea pedis Facility Procedures CPT4 Code: 15945859 Description: 29244 - DEB SUBQ TISSUE 20 SQ CM/< Modifier: Quantity: 1 CPT4 Code: Description: ICD-10 Diagnosis Description L97.512 Non-pressure chronic ulcer of other part of right foot with fat layer ex I73.89 Other specified peripheral vascular diseases Modifier: posed Quantity: Physician Procedures CPT4 Code: 6286381 Description: 11042 - WC PHYS SUBQ TISS 20 SQ CM Modifier: Quantity: 1 CPT4 Code: Description: ICD-10 Diagnosis Description L97.512 Non-pressure chronic ulcer of other part of right foot with fat layer ex I73.89 Other specified peripheral vascular diseases Modifier: posed Quantity: Electronic Signature(s) Signed: 12/02/2019 4:51:35 PM By: Linton Ham MD Entered By: Linton Ham on 12/01/2019 15:28:42

## 2019-12-02 NOTE — Progress Notes (Signed)
HAUNANI, DICKARD ([id number]) Visit Report for 12/01/2019 Arrival Information Details Patient Name: [name], [name]. Date of Service: 12/01/2019 1:45 PM Medical Record Number: [id number] Patient Account Number: [id number] Date of Birth/Sex: 01-03-1930 (86 y.o. F) Treating RN: Cornell Barman Primary Care Chong January: Ria Bush Other Clinician: Referring Starkisha Tullis: Ria Bush Treating Javontae Marlette/Extender: Tito Dine in Treatment: 7 Visit Information History Since Last Visit All ordered tests and consults were completed: No Patient Arrived: Gilford Rile Added or deleted any medications: No Arrival Time: 14:33 Any new allergies or adverse reactions: No Accompanied By: daughter Had a fall or experienced change in No Transfer Assistance: None activities of daily living that may affect Patient Identification Verified: Yes risk of falls: Secondary Verification Process Completed: Yes Signs or symptoms of abuse/neglect since last visito No Patient Requires Transmission-Based No Hospitalized since last visit: No Precautions: Implantable device outside of the clinic excluding No Patient Has Alerts: Yes cellular tissue based products placed in the center Patient Alerts: 08/31/19 ABI R)1.3 L) since last visit: 1.31 Pain Present Now: Yes 08/31/19 TBI R).61 L).58 Notes new onset of pain to her right heel. Rates pain as a 10 when touched, 3 at rest. Electronic Signature(s) Signed: 12/01/2019 4:50:41 PM By: [name] Entered By: [name] on 12/01/2019 14:34:38 [name] ([id number]) -------------------------------------------------------------------------------- Lower Extremity Assessment Details Patient Name: [name] Date of Service: 12/01/2019 1:45 PM Medical Record Number: [id number] Patient Account Number: [id number] Date of Birth/Sex: Apr 03, 1930 (86 y.o. F) Treating RN: Cornell Barman Primary Care Signora Zucco: Ria Bush Other  Clinician: Referring Landis Dowdy: Ria Bush Treating Luwanna Brossman/Extender: Tito Dine in Treatment: 7 Edema Assessment Assessed: [Left: Yes] [Right: No] Edema: [Left: Ye] [Right: s] Calf Left: Right: Point of Measurement: 30 cm From Medial Instep 26 cm 26.5 cm Ankle Left: Right: Point of Measurement: 10 cm From Medial Instep 22 cm 22 cm Vascular Assessment Pulses: Dorsalis Pedis Palpable: [Left:Yes] [Right:Yes] Posterior Tibial Palpable: [Left:Yes] [Right:Yes] Electronic Signature(s) Signed: 12/01/2019 4:50:41 PM By: [name] Signed: 12/01/2019 5:45:00 PM By: Gretta Cool, BSN, RN, CWS, Kim RN, BSN Entered By: [name] on 12/01/2019 14:40:19 [name] ([id number]) -------------------------------------------------------------------------------- Multi Wound Chart Details Patient Name: [name]. Date of Service: 12/01/2019 1:45 PM Medical Record Number: [id number] Patient Account Number: [id number] Date of Birth/Sex: 04/10/1929 (86 y.o. F) Treating RN: Cornell Barman Primary Care Parrish Daddario: Ria Bush Other Clinician: Referring Maija Biggers: Ria Bush Treating Darolyn Double/Extender: Tito Dine in Treatment: 7 Vital Signs Height(in): 62 Pulse(bpm): 46 Weight(lbs): 140 Blood Pressure(mmHg): 180/63 Body Mass Index(BMI): 26 Temperature(F): 97.8 Respiratory Rate(breaths/min): 18 Photos: [N/A:N/A] Wound Location: Right Metatarsal head fifth N/A N/A Wounding Event: Gradually Appeared N/A N/A Primary Etiology: Arterial Insufficiency Ulcer N/A N/A Comorbid History: Arrhythmia, Osteoarthritis N/A N/A Date Acquired: 04/09/2019 N/A N/A Weeks of Treatment: 7 N/A N/A Wound Status: Open N/A N/A Measurements L x W x D (cm) 1x1x0.3 N/A N/A Area (cm) : 0.785 N/A N/A Volume (cm) : 0.236 N/A N/A % Reduction in Area: 30.60% N/A N/A % Reduction in Volume: -108.80% N/A N/A Classification: Full Thickness Without Exposed N/A N/A Support  Structures Exudate Amount: Medium N/A N/A Exudate Type: Serosanguineous N/A N/A Exudate Color: red, brown N/A N/A Wound Margin: Distinct, outline attached N/A N/A Granulation Amount: Small (1-33%) N/A N/A Granulation Quality: Red N/A N/A Necrotic Amount: Large (67-100%) N/A N/A Exposed Structures: Fat Layer (Subcutaneous Tissue): N/A N/A Yes Fascia: No Tendon: No Muscle: No Joint: No Bone: No Epithelialization: None N/A N/A Debridement: Debridement - Excisional N/A  N/A Pre-procedure Verification/Time 14:55 N/A N/A Out Taken: Tissue Debrided: Subcutaneous, Slough N/A N/A Level: Skin/Subcutaneous Tissue N/A N/A Debridement Area (sq cm): 1 N/A N/A Instrument: Curette N/A N/A Bleeding: Moderate N/A N/A Hemostasis Achieved: Pressure N/A N/A Debridement Treatment Procedure was tolerated well N/A N/A Response: Post Debridement 1x1x0.8 N/A N/A Measurements L x W x D (cm) [name], [name] ([id number]) Post Debridement Volume: 0.628 N/A N/A (cm) Procedures Performed: Debridement N/A N/A Treatment Notes Wound #3 (Right Metatarsal head fifth) Notes santyl, saline gauze, bordered foam, heel cup Electronic Signature(s) Signed: 12/02/2019 4:51:35 PM By: Linton Ham MD Entered By: Linton Ham on 12/01/2019 15:08:07 [name] ([id number]) -------------------------------------------------------------------------------- [place] Details Patient Name: [name]. Date of Service: 12/01/2019 1:45 PM Medical Record Number: [id number] Patient Account Number: [id number] Date of Birth/Sex: [birth year] (86 y.o. F) Treating RN: Cornell Barman Primary Care Quentez Lober: Ria Bush Other Clinician: Referring Neviah Braud: Ria Bush Treating Cindel Daugherty/Extender: Tito Dine in Treatment: 7 Active Inactive Abuse / Safety / Falls / Self Care Management Nursing Diagnoses: Potential for falls Goals: Patient/caregiver will verbalize  understanding of skin care regimen Date Initiated: 10/12/2019 Target Resolution Date: 11/09/2019 Goal Status: Active Interventions: Assess fall risk on admission and as needed Notes: Necrotic Tissue Nursing Diagnoses: Impaired tissue integrity related to necrotic/devitalized tissue Goals: Necrotic/devitalized tissue will be minimized in the wound bed Date Initiated: 10/12/2019 Target Resolution Date: 11/09/2019 Goal Status: Active Interventions: Assess patient pain level pre-, during and post procedure and prior to discharge Treatment Activities: Enzymatic debridement : 10/12/2019 Notes: Orientation to the Wound Care Program Nursing Diagnoses: Knowledge deficit related to the wound healing center program Goals: Patient/caregiver will verbalize understanding of the [facility] Date Initiated: 10/12/2019 Target Resolution Date: 11/09/2019 Goal Status: Active Interventions: Provide education on orientation to the wound center Notes: Pain, Acute or Chronic Nursing Diagnoses: Potential alteration in comfort, pain Goals: Patient will verbalize adequate pain control and receive pain control interventions during procedures as needed [name], [name] ([id number]) Date Initiated: 10/12/2019 Target Resolution Date: 11/09/2019 Goal Status: Active Interventions: Complete pain assessment as per visit requirements Treatment Activities: Administer pain control measures as ordered : 10/12/2019 Notes: Peripheral Neuropathy Nursing Diagnoses: Knowledge deficit related to disease process and management of peripheral neurovascular dysfunction Potential alteration in peripheral tissue perfusion (select prior to confirmation of diagnosis) Goals: Patient/caregiver will verbalize understanding of disease process and disease management Date Initiated: 10/12/2019 Target Resolution Date: 11/09/2019 Goal Status: Active Interventions: Assess signs and symptoms of neuropathy upon admission and as  needed Provide education on Management of Neuropathy and Related Ulcers Notes: Wound/Skin Impairment Nursing Diagnoses: Impaired tissue integrity Goals: Ulcer/skin breakdown will have a volume reduction of 30% by week 4 Date Initiated: 10/12/2019 Target Resolution Date: 11/09/2019 Goal Status: Active Interventions: Assess ulceration(s) every visit Treatment Activities: Skin care regimen initiated : 10/12/2019 Topical wound management initiated : 10/12/2019 Notes: Electronic Signature(s) Signed: 12/01/2019 5:45:00 PM By: Gretta Cool, BSN, RN, CWS, Kim RN, BSN Entered By: Gretta Cool, BSN, RN, CWS, Kim on 12/01/2019 14:50:57 [name] ([id number]) -------------------------------------------------------------------------------- Pain Assessment Details Patient Name: [name]. Date of Service: 12/01/2019 1:45 PM Medical Record Number: [id number] Patient Account Number: [id number] Date of Birth/Sex: 15-Nov-1929 (86 y.o. F) Treating RN: Cornell Barman Primary Care Aelyn Stanaland: Ria Bush Other Clinician: Referring Annamae Shivley: Ria Bush Treating Korry Dalgleish/Extender: Tito Dine in Treatment: 7 Active Problems Location of Pain Severity and Description of Pain Patient Has Paino Yes Site Locations Pain Location: Pain in Ulcers With  Dressing Change: No Duration of the Pain. Constant / Intermittento Constant Rate the pain. Current Pain Level: 3 Worst Pain Level: 10 Least Pain Level: 3 Tolerable Pain Level: 3 Character of Pain Describe the Pain: Aching, Burning, Stabbing, Tender, Throbbing Pain Management and Medication Current Pain Management: Medication: Yes Rest: Yes Goals for Pain Management States Tylenol and pressure relief are effect for pain. Electronic Signature(s) Signed: 12/01/2019 4:50:41 PM By: [name] Signed: 12/01/2019 5:45:00 PM By: Gretta Cool, BSN, RN, CWS, Kim RN, BSN Entered By: [name] on 12/01/2019 14:37:46 [name], [name]  ([id number]) -------------------------------------------------------------------------------- Patient/Caregiver Education Details Patient Name: [name], [name]. Date of Service: 12/01/2019 1:45 PM Medical Record Number: [id number] Patient Account Number: [id number] Date of Birth/Gender: [birth year] (86 y.o. F) Treating RN: Cornell Barman Primary Care Physician: Ria Bush Other Clinician: Referring Physician: Ria Bush Treating Physician/Extender: Tito Dine in Treatment: 7 Education Assessment Education Provided To: Patient Education Topics Provided Wound Debridement: Handouts: Wound Debridement Methods: Demonstration, Explain/Verbal Responses: State content correctly Electronic Signature(s) Signed: 12/01/2019 5:45:00 PM By: Gretta Cool, BSN, RN, CWS, Kim RN, BSN Entered By: Gretta Cool, BSN, RN, CWS, Kim on 12/01/2019 14:59:12 [name] ([id number]) -------------------------------------------------------------------------------- Wound Assessment Details Patient Name: [name], [name]. Date of Service: 12/01/2019 1:45 PM Medical Record Number: [id number] Patient Account Number: [id number] Date of Birth/Sex: 1929-05-09 (86 y.o. F) Treating RN: Cornell Barman Primary Care Kristyn Obyrne: Ria Bush Other Clinician: Referring Zoelle Markus: Ria Bush Treating Sharla Tankard/Extender: Tito Dine in Treatment: 7 Wound Status Wound Number: 3 Primary Etiology: Arterial Insufficiency Ulcer Wound Location: Right Metatarsal head fifth Wound Status: Open Wounding Event: Gradually Appeared Comorbid History: Arrhythmia, Osteoarthritis Date Acquired: 04/09/2019 Weeks Of Treatment: 7 Clustered Wound: No Photos Wound Measurements Length: (cm) 1 Width: (cm) 1 Depth: (cm) 0.3 Area: (cm) 0.785 Volume: (cm) 0.236 % Reduction in Area: 30.6% % Reduction in Volume: -108.8% Epithelialization: None Wound Description Classification: Full Thickness Without Exposed  Support Structures Wound Margin: Distinct, outline attached Exudate Amount: Medium Exudate Type: Serosanguineous Exudate Color: red, brown Foul Odor After Cleansing: No Slough/Fibrino Yes Wound Bed Granulation Amount: Small (1-33%) Exposed Structure Granulation Quality: Red Fascia Exposed: No Necrotic Amount: Large (67-100%) Fat Layer (Subcutaneous Tissue) Exposed: Yes Necrotic Quality: Adherent Slough Tendon Exposed: No Muscle Exposed: No Joint Exposed: No Bone Exposed: No Treatment Notes Wound #3 (Right Metatarsal head fifth) Notes santyl, saline gauze, bordered foam, heel cup Electronic Signature(s) Signed: 12/01/2019 4:50:41 PM By: Clarita Leber ([id number]) Signed: 12/01/2019 5:45:00 PM By: Gretta Cool, BSN, RN, CWS, Kim RN, BSN Entered By: [name] on 12/01/2019 14:38:50 [name] ([id number]) -------------------------------------------------------------------------------- [place] Details Patient Name: [name]. Date of Service: 12/01/2019 1:45 PM Medical Record Number: [id number] Patient Account Number: [id number] Date of Birth/Sex: Aug 14, 1929 (86 y.o. F) Treating RN: Cornell Barman Primary Care Jozlin Bently: Ria Bush Other Clinician: Referring Kasondra Junod: Ria Bush Treating Nichoals Heyde/Extender: Tito Dine in Treatment: 7 Vital Signs Time Taken: 02:00 Temperature (F): 97.8 Height (in): 62 Pulse (bpm): 57 Weight (lbs): 140 Respiratory Rate (breaths/min): 18 Body Mass Index (BMI): 25.6 Blood Pressure (mmHg): 180/63 Reference Range: 80 - 120 mg / dl Electronic Signature(s) Signed: 12/01/2019 4:50:41 PM By: [name] Entered By: [name] on 12/01/2019 14:35:30

## 2019-12-02 NOTE — Telephone Encounter (Signed)
Filled and will place in [name]'s box tomorrow

## 2019-12-03 ENCOUNTER — Ambulatory Visit: Payer: Medicare Other | Admitting: Physician Assistant

## 2019-12-03 NOTE — Telephone Encounter (Signed)
Faxed form.

## 2019-12-06 ENCOUNTER — Telehealth: Payer: Self-pay | Admitting: *Deleted

## 2019-12-06 DIAGNOSIS — Z9181 History of falling: Secondary | ICD-10-CM | POA: Diagnosis not present

## 2019-12-06 DIAGNOSIS — I872 Venous insufficiency (chronic) (peripheral): Secondary | ICD-10-CM | POA: Diagnosis not present

## 2019-12-06 DIAGNOSIS — I739 Peripheral vascular disease, unspecified: Secondary | ICD-10-CM | POA: Diagnosis not present

## 2019-12-06 DIAGNOSIS — K219 Gastro-esophageal reflux disease without esophagitis: Secondary | ICD-10-CM | POA: Diagnosis not present

## 2019-12-06 DIAGNOSIS — L97513 Non-pressure chronic ulcer of other part of right foot with necrosis of muscle: Secondary | ICD-10-CM | POA: Diagnosis not present

## 2019-12-06 DIAGNOSIS — I4891 Unspecified atrial fibrillation: Secondary | ICD-10-CM | POA: Diagnosis not present

## 2019-12-06 DIAGNOSIS — E559 Vitamin D deficiency, unspecified: Secondary | ICD-10-CM | POA: Diagnosis not present

## 2019-12-06 DIAGNOSIS — I2510 Atherosclerotic heart disease of native coronary artery without angina pectoris: Secondary | ICD-10-CM | POA: Diagnosis not present

## 2019-12-06 DIAGNOSIS — G2581 Restless legs syndrome: Secondary | ICD-10-CM | POA: Diagnosis not present

## 2019-12-06 DIAGNOSIS — M1990 Unspecified osteoarthritis, unspecified site: Secondary | ICD-10-CM | POA: Diagnosis not present

## 2019-12-06 DIAGNOSIS — M5136 Other intervertebral disc degeneration, lumbar region: Secondary | ICD-10-CM | POA: Diagnosis not present

## 2019-12-06 DIAGNOSIS — S62102D Fracture of unspecified carpal bone, left wrist, subsequent encounter for fracture with routine healing: Secondary | ICD-10-CM | POA: Diagnosis not present

## 2019-12-06 DIAGNOSIS — I5032 Chronic diastolic (congestive) heart failure: Secondary | ICD-10-CM | POA: Diagnosis not present

## 2019-12-06 DIAGNOSIS — G9009 Other idiopathic peripheral autonomic neuropathy: Secondary | ICD-10-CM | POA: Diagnosis not present

## 2019-12-06 DIAGNOSIS — I272 Pulmonary hypertension, unspecified: Secondary | ICD-10-CM | POA: Diagnosis not present

## 2019-12-06 DIAGNOSIS — E7849 Other hyperlipidemia: Secondary | ICD-10-CM | POA: Diagnosis not present

## 2019-12-06 DIAGNOSIS — I130 Hypertensive heart and chronic kidney disease with heart failure and stage 1 through stage 4 chronic kidney disease, or unspecified chronic kidney disease: Secondary | ICD-10-CM | POA: Diagnosis not present

## 2019-12-06 DIAGNOSIS — E538 Deficiency of other specified B group vitamins: Secondary | ICD-10-CM | POA: Diagnosis not present

## 2019-12-06 DIAGNOSIS — Z7982 Long term (current) use of aspirin: Secondary | ICD-10-CM | POA: Diagnosis not present

## 2019-12-06 DIAGNOSIS — Z7901 Long term (current) use of anticoagulants: Secondary | ICD-10-CM | POA: Diagnosis not present

## 2019-12-06 DIAGNOSIS — N1831 Chronic kidney disease, stage 3a: Secondary | ICD-10-CM | POA: Diagnosis not present

## 2019-12-06 DIAGNOSIS — M810 Age-related osteoporosis without current pathological fracture: Secondary | ICD-10-CM | POA: Diagnosis not present

## 2019-12-06 DIAGNOSIS — Z7952 Long term (current) use of systemic steroids: Secondary | ICD-10-CM | POA: Diagnosis not present

## 2019-12-06 NOTE — Telephone Encounter (Signed)
Colletta Maryland OT with [facility] called stating that she needs to report several things. Colletta Maryland stated that the patient fell this morning and there was no apparent injury. Colletta Maryland stated that patient was able to get herself up and in a chair. Colletta Maryland stated that the patient has a wound on her right foot that is being treated. Colletta Maryland stated that the scab came off the wound and it was bleeding. Colletta Maryland stated that the patient's blood pressure was 159/85 this morning.Colletta Maryland stated that patient is not having any symptoms from her blood pressure being up.  Colletta Maryland stated that the patient's family has been monitoring her blood pressure and she was told that it was up over the weekend. Colletta Maryland stated that the patient was having acute right leg pain and that is gone now. Colletta Maryland stated that the patient is having some right lower extremity swelling from the calf down. Colletta Maryland stated that patient's daughter can be called back. Patient's daughter [name] [id number]

## 2019-12-07 ENCOUNTER — Other Ambulatory Visit: Payer: Self-pay | Admitting: Cardiovascular Disease

## 2019-12-07 DIAGNOSIS — E538 Deficiency of other specified B group vitamins: Secondary | ICD-10-CM | POA: Diagnosis not present

## 2019-12-07 DIAGNOSIS — Z7952 Long term (current) use of systemic steroids: Secondary | ICD-10-CM | POA: Diagnosis not present

## 2019-12-07 DIAGNOSIS — I2510 Atherosclerotic heart disease of native coronary artery without angina pectoris: Secondary | ICD-10-CM | POA: Diagnosis not present

## 2019-12-07 DIAGNOSIS — I5032 Chronic diastolic (congestive) heart failure: Secondary | ICD-10-CM | POA: Diagnosis not present

## 2019-12-07 DIAGNOSIS — G2581 Restless legs syndrome: Secondary | ICD-10-CM | POA: Diagnosis not present

## 2019-12-07 DIAGNOSIS — M1990 Unspecified osteoarthritis, unspecified site: Secondary | ICD-10-CM | POA: Diagnosis not present

## 2019-12-07 DIAGNOSIS — E559 Vitamin D deficiency, unspecified: Secondary | ICD-10-CM | POA: Diagnosis not present

## 2019-12-07 DIAGNOSIS — Z7901 Long term (current) use of anticoagulants: Secondary | ICD-10-CM | POA: Diagnosis not present

## 2019-12-07 DIAGNOSIS — G9009 Other idiopathic peripheral autonomic neuropathy: Secondary | ICD-10-CM | POA: Diagnosis not present

## 2019-12-07 DIAGNOSIS — Z9181 History of falling: Secondary | ICD-10-CM | POA: Diagnosis not present

## 2019-12-07 DIAGNOSIS — N1831 Chronic kidney disease, stage 3a: Secondary | ICD-10-CM | POA: Diagnosis not present

## 2019-12-07 DIAGNOSIS — I272 Pulmonary hypertension, unspecified: Secondary | ICD-10-CM | POA: Diagnosis not present

## 2019-12-07 DIAGNOSIS — M5136 Other intervertebral disc degeneration, lumbar region: Secondary | ICD-10-CM | POA: Diagnosis not present

## 2019-12-07 DIAGNOSIS — K219 Gastro-esophageal reflux disease without esophagitis: Secondary | ICD-10-CM | POA: Diagnosis not present

## 2019-12-07 DIAGNOSIS — S62102D Fracture of unspecified carpal bone, left wrist, subsequent encounter for fracture with routine healing: Secondary | ICD-10-CM | POA: Diagnosis not present

## 2019-12-07 DIAGNOSIS — M810 Age-related osteoporosis without current pathological fracture: Secondary | ICD-10-CM | POA: Diagnosis not present

## 2019-12-07 DIAGNOSIS — I872 Venous insufficiency (chronic) (peripheral): Secondary | ICD-10-CM | POA: Diagnosis not present

## 2019-12-07 DIAGNOSIS — E7849 Other hyperlipidemia: Secondary | ICD-10-CM | POA: Diagnosis not present

## 2019-12-07 DIAGNOSIS — L97513 Non-pressure chronic ulcer of other part of right foot with necrosis of muscle: Secondary | ICD-10-CM | POA: Diagnosis not present

## 2019-12-07 DIAGNOSIS — I130 Hypertensive heart and chronic kidney disease with heart failure and stage 1 through stage 4 chronic kidney disease, or unspecified chronic kidney disease: Secondary | ICD-10-CM | POA: Diagnosis not present

## 2019-12-07 DIAGNOSIS — I739 Peripheral vascular disease, unspecified: Secondary | ICD-10-CM | POA: Diagnosis not present

## 2019-12-07 DIAGNOSIS — I4891 Unspecified atrial fibrillation: Secondary | ICD-10-CM | POA: Diagnosis not present

## 2019-12-07 DIAGNOSIS — Z7982 Long term (current) use of aspirin: Secondary | ICD-10-CM | POA: Diagnosis not present

## 2019-12-08 DIAGNOSIS — I872 Venous insufficiency (chronic) (peripheral): Secondary | ICD-10-CM | POA: Diagnosis not present

## 2019-12-08 DIAGNOSIS — G9009 Other idiopathic peripheral autonomic neuropathy: Secondary | ICD-10-CM | POA: Diagnosis not present

## 2019-12-08 DIAGNOSIS — E7849 Other hyperlipidemia: Secondary | ICD-10-CM | POA: Diagnosis not present

## 2019-12-08 DIAGNOSIS — E538 Deficiency of other specified B group vitamins: Secondary | ICD-10-CM | POA: Diagnosis not present

## 2019-12-08 DIAGNOSIS — I2510 Atherosclerotic heart disease of native coronary artery without angina pectoris: Secondary | ICD-10-CM | POA: Diagnosis not present

## 2019-12-08 DIAGNOSIS — S62102D Fracture of unspecified carpal bone, left wrist, subsequent encounter for fracture with routine healing: Secondary | ICD-10-CM | POA: Diagnosis not present

## 2019-12-08 DIAGNOSIS — I130 Hypertensive heart and chronic kidney disease with heart failure and stage 1 through stage 4 chronic kidney disease, or unspecified chronic kidney disease: Secondary | ICD-10-CM | POA: Diagnosis not present

## 2019-12-08 DIAGNOSIS — E559 Vitamin D deficiency, unspecified: Secondary | ICD-10-CM | POA: Diagnosis not present

## 2019-12-08 DIAGNOSIS — I4891 Unspecified atrial fibrillation: Secondary | ICD-10-CM | POA: Diagnosis not present

## 2019-12-08 DIAGNOSIS — M5136 Other intervertebral disc degeneration, lumbar region: Secondary | ICD-10-CM | POA: Diagnosis not present

## 2019-12-08 DIAGNOSIS — I739 Peripheral vascular disease, unspecified: Secondary | ICD-10-CM | POA: Diagnosis not present

## 2019-12-08 DIAGNOSIS — M1990 Unspecified osteoarthritis, unspecified site: Secondary | ICD-10-CM | POA: Diagnosis not present

## 2019-12-08 DIAGNOSIS — Z7982 Long term (current) use of aspirin: Secondary | ICD-10-CM | POA: Diagnosis not present

## 2019-12-08 DIAGNOSIS — I5032 Chronic diastolic (congestive) heart failure: Secondary | ICD-10-CM | POA: Diagnosis not present

## 2019-12-08 DIAGNOSIS — M810 Age-related osteoporosis without current pathological fracture: Secondary | ICD-10-CM | POA: Diagnosis not present

## 2019-12-08 DIAGNOSIS — I272 Pulmonary hypertension, unspecified: Secondary | ICD-10-CM | POA: Diagnosis not present

## 2019-12-08 DIAGNOSIS — K219 Gastro-esophageal reflux disease without esophagitis: Secondary | ICD-10-CM | POA: Diagnosis not present

## 2019-12-08 DIAGNOSIS — N1831 Chronic kidney disease, stage 3a: Secondary | ICD-10-CM | POA: Diagnosis not present

## 2019-12-08 DIAGNOSIS — L97513 Non-pressure chronic ulcer of other part of right foot with necrosis of muscle: Secondary | ICD-10-CM | POA: Diagnosis not present

## 2019-12-08 DIAGNOSIS — Z7952 Long term (current) use of systemic steroids: Secondary | ICD-10-CM | POA: Diagnosis not present

## 2019-12-08 DIAGNOSIS — Z7901 Long term (current) use of anticoagulants: Secondary | ICD-10-CM | POA: Diagnosis not present

## 2019-12-08 DIAGNOSIS — G2581 Restless legs syndrome: Secondary | ICD-10-CM | POA: Diagnosis not present

## 2019-12-08 DIAGNOSIS — Z9181 History of falling: Secondary | ICD-10-CM | POA: Diagnosis not present

## 2019-12-09 ENCOUNTER — Encounter: Payer: Medicare Other | Attending: Physician Assistant | Admitting: Physician Assistant

## 2019-12-09 ENCOUNTER — Other Ambulatory Visit: Payer: Self-pay

## 2019-12-09 DIAGNOSIS — Z7901 Long term (current) use of anticoagulants: Secondary | ICD-10-CM | POA: Diagnosis not present

## 2019-12-09 DIAGNOSIS — I7389 Other specified peripheral vascular diseases: Secondary | ICD-10-CM | POA: Diagnosis not present

## 2019-12-09 DIAGNOSIS — K219 Gastro-esophageal reflux disease without esophagitis: Secondary | ICD-10-CM | POA: Diagnosis not present

## 2019-12-09 DIAGNOSIS — Z7952 Long term (current) use of systemic steroids: Secondary | ICD-10-CM | POA: Diagnosis not present

## 2019-12-09 DIAGNOSIS — L97512 Non-pressure chronic ulcer of other part of right foot with fat layer exposed: Secondary | ICD-10-CM | POA: Diagnosis not present

## 2019-12-09 DIAGNOSIS — M1990 Unspecified osteoarthritis, unspecified site: Secondary | ICD-10-CM | POA: Diagnosis not present

## 2019-12-09 DIAGNOSIS — Z7982 Long term (current) use of aspirin: Secondary | ICD-10-CM | POA: Diagnosis not present

## 2019-12-09 DIAGNOSIS — L97513 Non-pressure chronic ulcer of other part of right foot with necrosis of muscle: Secondary | ICD-10-CM | POA: Diagnosis not present

## 2019-12-09 DIAGNOSIS — S62102D Fracture of unspecified carpal bone, left wrist, subsequent encounter for fracture with routine healing: Secondary | ICD-10-CM | POA: Diagnosis not present

## 2019-12-09 DIAGNOSIS — I272 Pulmonary hypertension, unspecified: Secondary | ICD-10-CM | POA: Diagnosis not present

## 2019-12-09 DIAGNOSIS — I739 Peripheral vascular disease, unspecified: Secondary | ICD-10-CM | POA: Diagnosis not present

## 2019-12-09 DIAGNOSIS — I70235 Atherosclerosis of native arteries of right leg with ulceration of other part of foot: Secondary | ICD-10-CM | POA: Diagnosis not present

## 2019-12-09 DIAGNOSIS — G2581 Restless legs syndrome: Secondary | ICD-10-CM | POA: Diagnosis not present

## 2019-12-09 DIAGNOSIS — I4891 Unspecified atrial fibrillation: Secondary | ICD-10-CM | POA: Diagnosis not present

## 2019-12-09 DIAGNOSIS — G9009 Other idiopathic peripheral autonomic neuropathy: Secondary | ICD-10-CM | POA: Diagnosis not present

## 2019-12-09 DIAGNOSIS — I2510 Atherosclerotic heart disease of native coronary artery without angina pectoris: Secondary | ICD-10-CM | POA: Diagnosis not present

## 2019-12-09 DIAGNOSIS — Z9181 History of falling: Secondary | ICD-10-CM | POA: Diagnosis not present

## 2019-12-09 DIAGNOSIS — E559 Vitamin D deficiency, unspecified: Secondary | ICD-10-CM | POA: Diagnosis not present

## 2019-12-09 DIAGNOSIS — I480 Paroxysmal atrial fibrillation: Secondary | ICD-10-CM | POA: Insufficient documentation

## 2019-12-09 DIAGNOSIS — E7849 Other hyperlipidemia: Secondary | ICD-10-CM | POA: Diagnosis not present

## 2019-12-09 DIAGNOSIS — I5032 Chronic diastolic (congestive) heart failure: Secondary | ICD-10-CM | POA: Diagnosis not present

## 2019-12-09 DIAGNOSIS — N1831 Chronic kidney disease, stage 3a: Secondary | ICD-10-CM | POA: Diagnosis not present

## 2019-12-09 DIAGNOSIS — M810 Age-related osteoporosis without current pathological fracture: Secondary | ICD-10-CM | POA: Diagnosis not present

## 2019-12-09 DIAGNOSIS — I130 Hypertensive heart and chronic kidney disease with heart failure and stage 1 through stage 4 chronic kidney disease, or unspecified chronic kidney disease: Secondary | ICD-10-CM | POA: Diagnosis not present

## 2019-12-09 DIAGNOSIS — I872 Venous insufficiency (chronic) (peripheral): Secondary | ICD-10-CM | POA: Diagnosis not present

## 2019-12-09 DIAGNOSIS — M5136 Other intervertebral disc degeneration, lumbar region: Secondary | ICD-10-CM | POA: Diagnosis not present

## 2019-12-09 DIAGNOSIS — E538 Deficiency of other specified B group vitamins: Secondary | ICD-10-CM | POA: Diagnosis not present

## 2019-12-09 NOTE — Progress Notes (Addendum)
JELICIA, NANTZ ([id number]) Visit Report for 12/09/2019 Chief Complaint Document Details Patient Name: [name], [name]. Date of Service: 12/09/2019 2:15 PM Medical Record Number: [id number] Patient Account Number: [id number] Date of Birth/Sex: 1929/08/19 (86 y.o. F) Treating RN: Cornell Barman Primary Care Provider: Ria Bush Other Clinician: Referring Provider: Ria Bush Treating Provider/Extender: Melburn Hake, Mae Cianci Weeks in Treatment: 8 Information Obtained from: Patient Chief Complaint Right Lateral Foot Ulcer Electronic Signature(s) Signed: 12/09/2019 2:42:47 PM By: Worthy Keeler PA-C Entered By: Worthy Keeler on 12/09/2019 14:42:47 ABELLA, SHUGART ([id number]) -------------------------------------------------------------------------------- HPI Details Patient Name: [name] Date of Service: 12/09/2019 2:15 PM Medical Record Number: [id number] Patient Account Number: [id number] Date of Birth/Sex: 18-Mar-1930 (86 y.o. F) Treating RN: Cornell Barman Primary Care Provider: Ria Bush Other Clinician: Referring Provider: Ria Bush Treating Provider/Extender: Melburn Hake, Zaydan Papesh Weeks in Treatment: 8 History of Present Illness HPI Description: 10/12/2019 upon evaluation today patient appears to be doing somewhat poorly in regard to her wound at this point. She has a ulcer over the right fifth metatarsal region. This has been managed by podiatry up to this point. Unfortunately the patient just does not seem to be making the progress that they were hoping for an subsequently the patient was referred to [place] by [facility] Dr. Posey Pronto. With that being said I did note that the patient does have peripheral vascular disease which has been managed by Dr. Lazaro Arms her most recent angiogram with intervention was on 08/02/2019. The ABIs following were on the right 1.30 with a TBI of 0.61 normal left 1.31 with a TBI of 0.58. Obviously things seem to be doing somewhat  better at this point hopefully enough to allow her to heal. She is on anticoagulant therapy due to atrial fibrillation long-term. With that being said the patient is unfortunately having quite a bit of discomfort at this time secondary to the wound. She also has erythema which is very likely secondary to infection. I am going obtain a culture of the area today once debridement is complete. Apparently the patient also had a x-ray that was performed by Dr. Posey Pronto on 09/21/2019 and this showed that the patient had no obvious signs on x-ray of cortical irregularity and no osteomyelitis obviously noted. With that being said as I discussed with the patient's daughter as well this does not indicate that the patient absolutely has no osteomyelitis as x-rays are only at best 50% helpful in identifying this complication. Nonetheless I do believe that we need to have the patient continue to have this further evaluated specifically I am to recommend that we check into an MRI which I think would be ideal to evaluate whether or not there is osteomyelitis present. 7/16; this is a patient with an ulcer over the right fifth metatarsal head. She follows with Dr. Lucky Cowboy at vein [facility] vascular. She is due to have an MRI of the foot on 7/26. Using Santyl to the wound 11/04/2019 on evaluation today patient appears to be doing okay in regard to her wound at this time. Unfortunately since I last saw her which was actually her second visit here in the clinic she has moved into a new apartment and then subsequently had a fall. She ended up in the hospital she did fracture her left arm and subsequently has also been dealing with a lot of dizziness she has been at [employer]. She tells me that getting in the change the dressing has been somewhat difficult as far as  daily dressing changes are concerned with the Santyl. Nonetheless when they do change it that is what is been used and her daughter-in-law has also been coming in to  help out as well with the dressing changes to try to make sure it gets done daily. Fortunately there is no signs of active infection at this time. No fever chills noted. I do believe the Santyl has been beneficial and is likely still the best way to go at this point for the patient. I did review the patient's MRI which showed some potential for reactive osteitis but did not show any signs of osteomyelitis overtly. That cannot be excluded but again right now it does not appear that is very likely present as well. This is good news. I also did review her culture she has been on the Bactrim she tells me she has taken this and again that was appropriate based on the results of her culture. 11/19/2019 upon evaluation today patient appears to be doing decently well in regard to her wound. Fortunately there is no signs of active infection at this time wound is not significantly larger in fact is roughly about the same at this point. With that being said there is not as much erythema as previously noted I think the infection seems to be under much better control which is great news. Unfortunately she has fallen since I last saw her and broken her left arm she has an appointment with them following the appointment with me. 8/25; patient was worked in acutely today at the request of home health and was concerned about her right heel, generalized erythema in her feet bilaterally. According to her daughter things have been getting worse over the last for 5 days but not precisely from a wound care point of view. Her wound is on the lateral part of the right fifth metatarsal head looks about the same as I remember this. She has developed dryness and flaking of the skin on her plantar feet. As well a very tender area localized over the insertion point of the Achilles tendon. She has restless leg syndrome. Does not specifically offload the area at night. She wears socks at home and does not really give me a pressure on  the right heel issue. I reviewed her vascular status. She underwent her last revascularization on 08/02/2019 by Dr. Lucky Cowboy. This included an angioplasty of the right peroneal artery as well as the right posterior tibial artery. Her last noninvasive arterial study was on 11/23/2019. On the right her ABI was 1.08 TBI of 0.48. On the left ABI at 1.21 with a TBI of 0.54. Family relates that Dr. Lucky Cowboy felt that he had done the best he could and this. We have been using Santyl to the one wound she has on the right lateral fifth metatarsal head. She does not have a new wound today which is the concern that letter coming into the clinic 12/09/2019 on evaluation today patient appears to be doing decently well in regard to her foot ulcer at this point. She does note that with the compression wrap that the home health nurse put on after contacting our clinic that she actually did much better and felt much better. Her heel is also improved. Fortunately there is no signs of active infection at this time. No fevers, chills, nausea, vomiting, or diarrhea. Electronic Signature(s) Signed: 12/09/2019 3:16:44 PM By: Worthy Keeler PA-C Entered By: Worthy Keeler on 12/09/2019 15:16:44 [name] ([id number]) -------------------------------------------------------------------------------- Physical Exam Details  Patient Name: [name], [name]. Date of Service: 12/09/2019 2:15 PM Medical Record Number: [id number] Patient Account Number: [id number] Date of Birth/Sex: [birth year] (86 y.o. F) Treating RN: Cornell Barman Primary Care Provider: Ria Bush Other Clinician: Referring Provider: Ria Bush Treating Provider/Extender: STONE III, Leni Pankonin Weeks in Treatment: 8 Constitutional Well-nourished and well-hydrated in no acute distress. Respiratory normal breathing without difficulty. Psychiatric this patient is able to make decisions and demonstrates good insight into disease process. Alert and Oriented x 3.  pleasant and cooperative. Notes Patient's wound currently showed signs of slough buildup on the surface of the wound fortunately there is no evidence of severe or even local infection at this point no evidence of active infection systemically. She unfortunately keeps falling but this is unrelated to the wound in my opinion. Nonetheless I do believe this is something that is of utmost concern especially considering she is on Eliquis. Electronic Signature(s) Signed: 12/09/2019 3:17:10 PM By: Worthy Keeler PA-C Entered By: Worthy Keeler on 12/09/2019 15:17:09 ROSAMARY, BOUDREAU ([id number]) -------------------------------------------------------------------------------- Physician Orders Details Patient Name: [name] Date of Service: 12/09/2019 2:15 PM Medical Record Number: [id number] Patient Account Number: [id number] Date of Birth/Sex: 1929-12-07 (85 y.o. F) Treating RN: Grover Canavan Primary Care Provider: Ria Bush Other Clinician: Referring Provider: Ria Bush Treating Provider/Extender: Melburn Hake, Shirley Bolle Weeks in Treatment: 8 Verbal / Phone Orders: No Diagnosis Coding ICD-10 Coding Code Description I73.89 Other specified peripheral vascular diseases L97.512 Non-pressure chronic ulcer of other part of right foot with fat layer exposed Z79.01 Long term (current) use of anticoagulants I48.0 Paroxysmal atrial fibrillation B35.3 Tinea pedis Wound Cleansing Wound #3 Right Metatarsal head fifth o Clean wound with Normal Saline. Primary Wound Dressing Wound #3 Right Metatarsal head fifth o Iodoflex Secondary Dressing Wound #3 Right Metatarsal head fifth o ABD pad o Other - heel cup to right heel Dressing Change Frequency Wound #3 Right Metatarsal head fifth o Other: - Tuesday and Friday. HH to change Tues and Friday weeks patient does not come to the [facility] and on opposite weeks [place] to changed on Tuesday and [place] changes on Friday. Follow-up  Appointments Wound #3 Right Metatarsal head fifth o Return Appointment in 2 weeks. Edema Control Wound #3 Right Metatarsal head fifth o Elevate legs to the level of the heart and pump ankles as often as possible Off-Loading Wound #3 Right Metatarsal head fifth o Other: - Keep pressure off of the wound, heel cup on right heel Additional Orders / Instructions Wound #3 Right Metatarsal head fifth o Increase protein intake. [place] Visits o Home Health Nurse may visit PRN to address patientos wound care needs. o FACE TO FACE ENCOUNTER: MEDICARE and MEDICAID PATIENTS: I certify that this patient is under my care and that I had a face-to-face encounter that meets the physician face-to-face encounter requirements with this patient on this date. The encounter with the patient was in whole or in part for the following MEDICAL CONDITION: (primary reason for Home MISHIKA, FLIPPEN ([id number]) Healthcare) MEDICAL NECESSITY: I certify, that based on my findings, NURSING services are a medically necessary home health service. HOME BOUND STATUS: I certify that my clinical findings support that this patient is homebound (i.e., Due to illness or injury, pt requires aid of supportive devices such as crutches, cane, wheelchairs, walkers, the use of special transportation or the assistance of another person to leave their place of residence. There is a normal inability to leave the home and doing so  requires considerable and taxing effort. Other absences are for medical reasons / religious services and are infrequent or of short duration when for other reasons). o If current dressing causes regression in wound condition, may D/C ordered dressing product/s and apply Normal Saline Moist Dressing daily until next [place] / Other MD appointment. [place] of regression in wound condition at [phone]. o Please direct any NON-WOUND  related issues/requests for orders to patient's Primary Care Physician Electronic Signature(s) Signed: 12/09/2019 4:51:51 PM By: Grover Canavan Signed: 12/09/2019 5:25:12 PM By: Worthy Keeler PA-C Entered By: Grover Canavan on 12/09/2019 15:10:01 LATRISE, BOWLAND ([id number]) -------------------------------------------------------------------------------- Problem List Details Patient Name: [name], [name]. Date of Service: 12/09/2019 2:15 PM Medical Record Number: [id number] Patient Account Number: [id number] Date of Birth/Sex: 11-19-1929 (86 y.o. F) Treating RN: Cornell Barman Primary Care Provider: Ria Bush Other Clinician: Referring Provider: Ria Bush Treating Provider/Extender: Melburn Hake, Annaliah Rivenbark Weeks in Treatment: 8 Active Problems ICD-10 Encounter Code Description Active Date MDM Diagnosis I73.89 Other specified peripheral vascular diseases 10/12/2019 No Yes L97.512 Non-pressure chronic ulcer of other part of right foot with fat layer 10/12/2019 No Yes exposed Z79.01 Long term (current) use of anticoagulants 10/12/2019 No Yes I48.0 Paroxysmal atrial fibrillation 10/12/2019 No Yes B35.3 Tinea pedis 12/01/2019 No Yes Inactive Problems Resolved Problems Electronic Signature(s) Signed: 12/09/2019 2:42:36 PM By: Worthy Keeler PA-C Entered By: Worthy Keeler on 12/09/2019 14:42:35 [name] ([id number]) -------------------------------------------------------------------------------- Progress Note Details Patient Name: [name]. Date of Service: 12/09/2019 2:15 PM Medical Record Number: [id number] Patient Account Number: [id number] Date of Birth/Sex: [birth year] (86 y.o. F) Treating RN: Cornell Barman Primary Care Provider: Ria Bush Other Clinician: Referring Provider: Ria Bush Treating Provider/Extender: Melburn Hake, Wynee Matarazzo Weeks in Treatment: 8 Subjective Chief Complaint Information obtained from Patient Right Lateral Foot Ulcer History of  Present Illness (HPI) 10/12/2019 upon evaluation today patient appears to be doing somewhat poorly in regard to her wound at this point. She has a ulcer over the right fifth metatarsal region. This has been managed by podiatry up to this point. Unfortunately the patient just does not seem to be making the progress that they were hoping for an subsequently the patient was referred to [place] by [facility] Dr. Posey Pronto. With that being said I did note that the patient does have peripheral vascular disease which has been managed by Dr. Lazaro Arms her most recent angiogram with intervention was on 08/02/2019. The ABIs following were on the right 1.30 with a TBI of 0.61 normal left 1.31 with a TBI of 0.58. Obviously things seem to be doing somewhat better at this point hopefully enough to allow her to heal. She is on anticoagulant therapy due to atrial fibrillation long-term. With that being said the patient is unfortunately having quite a bit of discomfort at this time secondary to the wound. She also has erythema which is very likely secondary to infection. I am going obtain a culture of the area today once debridement is complete. Apparently the patient also had a x-ray that was performed by Dr. Posey Pronto on 09/21/2019 and this showed that the patient had no obvious signs on x-ray of cortical irregularity and no osteomyelitis obviously noted. With that being said as I discussed with the patient's daughter as well this does not indicate that the patient absolutely has no osteomyelitis as x-rays are only at best 50% helpful in identifying this complication. Nonetheless I do believe that we need to have the  patient continue to have this further evaluated specifically I am to recommend that we check into an MRI which I think would be ideal to evaluate whether or not there is osteomyelitis present. 7/16; this is a patient with an ulcer over the right fifth metatarsal head. She follows with Dr. Lucky Cowboy at vein and  vascular. She is due to have an MRI of the foot on 7/26. Using Santyl to the wound 11/04/2019 on evaluation today patient appears to be doing okay in regard to her wound at this time. Unfortunately since I last saw her which was actually her second visit here in the clinic she has moved into a new apartment and then subsequently had a fall. She ended up in the hospital she did fracture her left arm and subsequently has also been dealing with a lot of dizziness she has been at [employer]. She tells me that getting in the change the dressing has been somewhat difficult as far as daily dressing changes are concerned with the Santyl. Nonetheless when they do change it that is what is been used and her daughter-in-law has also been coming in to help out as well with the dressing changes to try to make sure it gets done daily. Fortunately there is no signs of active infection at this time. No fever chills noted. I do believe the Santyl has been beneficial and is likely still the best way to go at this point for the patient. I did review the patient's MRI which showed some potential for reactive osteitis but did not show any signs of osteomyelitis overtly. That cannot be excluded but again right now it does not appear that is very likely present as well. This is good news. I also did review her culture she has been on the Bactrim she tells me she has taken this and again that was appropriate based on the results of her culture. 11/19/2019 upon evaluation today patient appears to be doing decently well in regard to her wound. Fortunately there is no signs of active infection at this time wound is not significantly larger in fact is roughly about the same at this point. With that being said there is not as much erythema as previously noted I think the infection seems to be under much better control which is great news. Unfortunately she has fallen since I last saw her and broken her left arm she has an  appointment with them following the appointment with me. 8/25; patient was worked in acutely today at the request of home health and was concerned about her right heel, generalized erythema in her feet bilaterally. According to her daughter things have been getting worse over the last for 5 days but not precisely from a wound care point of view. Her wound is on the lateral part of the right fifth metatarsal head looks about the same as I remember this. She has developed dryness and flaking of the skin on her plantar feet. As well a very tender area localized over the insertion point of the Achilles tendon. She has restless leg syndrome. Does not specifically offload the area at night. She wears socks at home and does not really give me a pressure on the right heel issue. I reviewed her vascular status. She underwent her last revascularization on 08/02/2019 by Dr. Lucky Cowboy. This included an angioplasty of the right peroneal artery as well as the right posterior tibial artery. Her last noninvasive arterial study was on 11/23/2019. On the right her ABI was  1.08 TBI of 0.48. On the left ABI at 1.21 with a TBI of 0.54. Family relates that Dr. Lucky Cowboy felt that he had done the best he could and this. We have been using Santyl to the one wound she has on the right lateral fifth metatarsal head. She does not have a new wound today which is the concern that letter coming into the clinic 12/09/2019 on evaluation today patient appears to be doing decently well in regard to her foot ulcer at this point. She does note that with the compression wrap that the home health nurse put on after contacting our clinic that she actually did much better and felt much better. Her heel is also improved. Fortunately there is no signs of active infection at this time. No fevers, chills, nausea, vomiting, or diarrhea. SKYRAH, KRUPP ([id number]) Objective Constitutional Well-nourished and well-hydrated in no acute distress. Vitals Time  Taken: 2:15 AM, Height: 62 in, Weight: 140 lbs, BMI: 25.6, Temperature: 98.2 F, Pulse: 57 bpm, Respiratory Rate: 18 breaths/min, Blood Pressure: 169/69 mmHg. Respiratory normal breathing without difficulty. Psychiatric this patient is able to make decisions and demonstrates good insight into disease process. Alert and Oriented x 3. pleasant and cooperative. General Notes: Patient's wound currently showed signs of slough buildup on the surface of the wound fortunately there is no evidence of severe or even local infection at this point no evidence of active infection systemically. She unfortunately keeps falling but this is unrelated to the wound in my opinion. Nonetheless I do believe this is something that is of utmost concern especially considering she is on Eliquis. Integumentary (Hair, Skin) Wound #3 status is Open. Original cause of wound was Gradually Appeared. The wound is located on the Right Metatarsal head fifth. The wound measures 1.2cm length x 1cm width x 0.3cm depth; 0.942cm^2 area and 0.283cm^3 volume. There is Fat Layer (Subcutaneous Tissue) exposed. There is a medium amount of serosanguineous drainage noted. The wound margin is distinct with the outline attached to the wound base. There is small (1-33%) red granulation within the wound bed. There is a large (67-100%) amount of necrotic tissue within the wound bed including Adherent Slough. Assessment Active Problems ICD-10 Other specified peripheral vascular diseases Non-pressure chronic ulcer of other part of right foot with fat layer exposed Long term (current) use of anticoagulants Paroxysmal atrial fibrillation Tinea pedis Procedures Wound #3 Pre-procedure diagnosis of Wound #3 is an Arterial Insufficiency Ulcer located on the Right Metatarsal head fifth . There was a Three Layer Compression Therapy Procedure with a pre-treatment ABI of 1.3 by Grover Canavan, RN. Post procedure Diagnosis Wound #3: Same as  Pre-Procedure Plan Wound Cleansing: Wound #3 Right Metatarsal head fifth: Clean wound with Normal Saline. Primary Wound Dressing: Wound #3 Right Metatarsal head fifth: Iodoflex Secondary Dressing: Wound #3 Right Metatarsal head fifth: ABD pad Other - heel cup to right heel Dressing Change Frequency: Wound #3 Right Metatarsal head fifth: Other: - Tuesday and Friday. HH to change Tues and Friday weeks patient does not come to the [facility] and on opposite weeks [place] to changed on Tuesday and [place] changes on Friday. ADANNA, ZUCKERMAN ([id number]) Follow-up Appointments: Wound #3 Right Metatarsal head fifth: Return Appointment in 2 weeks. Edema Control: Wound #3 Right Metatarsal head fifth: Elevate legs to the level of the heart and pump ankles as often as possible Off-Loading: Wound #3 Right Metatarsal head fifth: Other: - Keep pressure off of the wound, heel cup on right heel Additional Orders / Instructions:  Wound #3 Right Metatarsal head fifth: Increase protein intake. Home Health: [place] Nurse may visit PRN to address patient s wound care needs. FACE TO FACE ENCOUNTER: MEDICARE and MEDICAID PATIENTS: I certify that this patient is under my care and that I had a face-to-face encounter that meets the physician face-to-face encounter requirements with this patient on this date. The encounter with the patient was in whole or in part for the following MEDICAL CONDITION: (primary reason for [place]) MEDICAL NECESSITY: I certify, that based on my findings, NURSING services are a medically necessary home health service. HOME BOUND STATUS: I certify that my clinical findings support that this patient is homebound (i.e., Due to illness or injury, pt requires aid of supportive devices such as crutches, cane, wheelchairs, walkers, the use of special transportation or the assistance of another person to leave their place of residence. There is a normal  inability to leave the home and doing so requires considerable and taxing effort. Other absences are for medical reasons / religious services and are infrequent or of short duration when for other reasons). If current dressing causes regression in wound condition, may D/C ordered dressing product/s and apply Normal Saline Moist Dressing daily until next [place] / Other MD appointment. [place] of regression in wound condition at [phone]. Please direct any NON-WOUND related issues/requests for orders to patient's Primary Care Physician 1. I would recommend that we go ahead and continue with the compression wrap that seems to be helpful this is a 3 layer compression wrap. 2. I am also can recommend that we actually initiate treatment with Iodoflex which I think will help to clean up some of the necrotic debris at this point. 3. I would also recommend she elevate her legs much as possible to help keep under control the edema. We will see patient back for reevaluation in 2 weeks here in the clinic. If anything worsens or changes patient will contact our office for additional recommendations. We will change her dressings twice a week on Tuesdays and Fridays. We will see her on Friday as every other week and when we do we will do that dressing change otherwise home health will change this twice in between. Electronic Signature(s) Signed: 12/09/2019 3:18:03 PM By: Worthy Keeler PA-C Entered By: Worthy Keeler on 12/09/2019 15:18:03 [name] ([id number]) -------------------------------------------------------------------------------- SuperBill Details Patient Name: [name] Date of Service: 12/09/2019 Medical Record Number: [id number] Patient Account Number: [id number] Date of Birth/Sex: April 06, 1930 (86 y.o. F) Treating RN: Grover Canavan Primary Care Provider: Ria Bush Other Clinician: Referring Provider: Ria Bush Treating  Provider/Extender: Melburn Hake, Haroun Cotham Weeks in Treatment: 8 Diagnosis Coding ICD-10 Codes Code Description I73.89 Other specified peripheral vascular diseases L97.512 Non-pressure chronic ulcer of other part of right foot with fat layer exposed Z79.01 Long term (current) use of anticoagulants I48.0 Paroxysmal atrial fibrillation B35.3 Tinea pedis Facility Procedures CPT4 Code: 56314970 Description: (Facility Use Only) [id number] - APPLY [place] LWR RT LEG Modifier: Quantity: 1 Physician Procedures CPT4 Code: 8502774 Description: 12878 - WC PHYS LEVEL 3 - EST PT Modifier: Quantity: 1 CPT4 Code: Description: ICD-10 Diagnosis Description I73.89 Other specified peripheral vascular diseases L97.512 Non-pressure chronic ulcer of other part of right foot with fat layer e Z79.01 Long term (current) use of anticoagulants I48.0 Paroxysmal atrial  fibrillation Modifier: xposed Quantity: Electronic Signature(s) Signed: 12/09/2019 3:18:21 PM By: Worthy Keeler PA-C Entered By: Worthy Keeler on 12/09/2019 15:18:20

## 2019-12-10 DIAGNOSIS — K219 Gastro-esophageal reflux disease without esophagitis: Secondary | ICD-10-CM | POA: Diagnosis not present

## 2019-12-10 DIAGNOSIS — G2581 Restless legs syndrome: Secondary | ICD-10-CM | POA: Diagnosis not present

## 2019-12-10 DIAGNOSIS — S62102D Fracture of unspecified carpal bone, left wrist, subsequent encounter for fracture with routine healing: Secondary | ICD-10-CM | POA: Diagnosis not present

## 2019-12-10 DIAGNOSIS — M1990 Unspecified osteoarthritis, unspecified site: Secondary | ICD-10-CM | POA: Diagnosis not present

## 2019-12-10 DIAGNOSIS — I872 Venous insufficiency (chronic) (peripheral): Secondary | ICD-10-CM | POA: Diagnosis not present

## 2019-12-10 DIAGNOSIS — Z7952 Long term (current) use of systemic steroids: Secondary | ICD-10-CM | POA: Diagnosis not present

## 2019-12-10 DIAGNOSIS — E7849 Other hyperlipidemia: Secondary | ICD-10-CM | POA: Diagnosis not present

## 2019-12-10 DIAGNOSIS — E538 Deficiency of other specified B group vitamins: Secondary | ICD-10-CM | POA: Diagnosis not present

## 2019-12-10 DIAGNOSIS — I739 Peripheral vascular disease, unspecified: Secondary | ICD-10-CM | POA: Diagnosis not present

## 2019-12-10 DIAGNOSIS — I272 Pulmonary hypertension, unspecified: Secondary | ICD-10-CM | POA: Diagnosis not present

## 2019-12-10 DIAGNOSIS — Z7982 Long term (current) use of aspirin: Secondary | ICD-10-CM | POA: Diagnosis not present

## 2019-12-10 DIAGNOSIS — Z7901 Long term (current) use of anticoagulants: Secondary | ICD-10-CM | POA: Diagnosis not present

## 2019-12-10 DIAGNOSIS — I2510 Atherosclerotic heart disease of native coronary artery without angina pectoris: Secondary | ICD-10-CM | POA: Diagnosis not present

## 2019-12-10 DIAGNOSIS — I4891 Unspecified atrial fibrillation: Secondary | ICD-10-CM | POA: Diagnosis not present

## 2019-12-10 DIAGNOSIS — Z9181 History of falling: Secondary | ICD-10-CM | POA: Diagnosis not present

## 2019-12-10 DIAGNOSIS — M5136 Other intervertebral disc degeneration, lumbar region: Secondary | ICD-10-CM | POA: Diagnosis not present

## 2019-12-10 DIAGNOSIS — S52602D Unspecified fracture of lower end of left ulna, subsequent encounter for closed fracture with routine healing: Secondary | ICD-10-CM | POA: Diagnosis not present

## 2019-12-10 DIAGNOSIS — L97513 Non-pressure chronic ulcer of other part of right foot with necrosis of muscle: Secondary | ICD-10-CM | POA: Diagnosis not present

## 2019-12-10 DIAGNOSIS — I130 Hypertensive heart and chronic kidney disease with heart failure and stage 1 through stage 4 chronic kidney disease, or unspecified chronic kidney disease: Secondary | ICD-10-CM | POA: Diagnosis not present

## 2019-12-10 DIAGNOSIS — M810 Age-related osteoporosis without current pathological fracture: Secondary | ICD-10-CM | POA: Diagnosis not present

## 2019-12-10 DIAGNOSIS — I5032 Chronic diastolic (congestive) heart failure: Secondary | ICD-10-CM | POA: Diagnosis not present

## 2019-12-10 DIAGNOSIS — S52502D Unspecified fracture of the lower end of left radius, subsequent encounter for closed fracture with routine healing: Secondary | ICD-10-CM | POA: Diagnosis not present

## 2019-12-10 DIAGNOSIS — E559 Vitamin D deficiency, unspecified: Secondary | ICD-10-CM | POA: Diagnosis not present

## 2019-12-10 DIAGNOSIS — N1831 Chronic kidney disease, stage 3a: Secondary | ICD-10-CM | POA: Diagnosis not present

## 2019-12-10 DIAGNOSIS — G9009 Other idiopathic peripheral autonomic neuropathy: Secondary | ICD-10-CM | POA: Diagnosis not present

## 2019-12-10 NOTE — Telephone Encounter (Signed)
Lvm asking pt/pt's daughter, [name] (on dpr), to call back.  See Dr. Synthia Innocent message below.

## 2019-12-10 NOTE — Telephone Encounter (Signed)
Please call for update on blood pressures and right leg swelling after fall on Monday.

## 2019-12-10 NOTE — Progress Notes (Signed)
RAYNELL, UPTON ([id number]) Visit Report for 12/09/2019 Arrival Information Details Patient Name: [name], [name]. Date of Service: 12/09/2019 2:15 PM Medical Record Number: [id number] Patient Account Number: [id number] Date of Birth/Sex: 11-02-1929 (86 y.o. F) Treating RN: Cornell Barman Primary Care Taquana Bartley: Ria Bush Other Clinician: Referring Lanasia Porras: Ria Bush Treating Chanise Habeck/Extender: Melburn Hake, HOYT Weeks in Treatment: 8 Visit Information History Since Last Visit All ordered tests and consults were completed: No Patient Arrived: Wheel Chair Added or deleted any medications: No Arrival Time: 14:25 Any new allergies or adverse reactions: No Accompanied By: daughter Had a fall or experienced change in No Transfer Assistance: EasyPivot Patient Lift activities of daily living that may affect Patient Identification Verified: Yes risk of falls: Secondary Verification Process Completed: Yes Signs or symptoms of abuse/neglect since last visito No Patient Requires Transmission-Based No Hospitalized since last visit: No Precautions: Implantable device outside of the clinic excluding No Patient Has Alerts: Yes cellular tissue based products placed in the center Patient Alerts: 08/31/19 ABI R)1.3 L) since last visit: 1.31 Has Dressing in Place as Prescribed: Yes 08/31/19 TBI R).61 Pain Present Now: No L).58 Electronic Signature(s) Signed: 12/09/2019 4:36:13 PM By: Darci Needle Entered By: Darci Needle on 12/09/2019 14:29:14 [name] ([id number]) -------------------------------------------------------------------------------- Compression Therapy Details Patient Name: [name] Date of Service: 12/09/2019 2:15 PM Medical Record Number: [id number] Patient Account Number: [id number] Date of Birth/Sex: 07-Sep-1929 (86 y.o. F) Treating RN: Grover Canavan Primary Care Danell Vazquez: Ria Bush Other Clinician: Referring Marialuiza Car: Ria Bush Treating Tiyana Galla/Extender: Melburn Hake, HOYT Weeks in Treatment: 8 Compression Therapy Performed for Wound Assessment: Wound #3 Right Metatarsal head fifth Performed By: Clinician Grover Canavan, RN Compression Type: Three Layer Pre Treatment ABI: 1.3 Post Procedure Diagnosis Same as Pre-procedure Electronic Signature(s) Signed: 12/09/2019 4:51:51 PM By: Grover Canavan Entered By: Grover Canavan on 12/09/2019 15:05:39 MARQUASHA, BRUTUS ([id number]) -------------------------------------------------------------------------------- Encounter Discharge Information Details Patient Name: [name] Date of Service: 12/09/2019 2:15 PM Medical Record Number: [id number] Patient Account Number: [id number] Date of Birth/Sex: [birth year] (86 y.o. F) Treating RN: Grover Canavan Primary Care Christophere Hillhouse: Ria Bush Other Clinician: Referring Cozetta Seif: Ria Bush Treating Ed Rayson/Extender: Melburn Hake, HOYT Weeks in Treatment: 8 Encounter Discharge Information Items Discharge Condition: Stable Ambulatory Status: Wheelchair Discharge Destination: Home Transportation: Other Accompanied By: daughter Schedule Follow-up Appointment: Yes Clinical Summary of Care: Electronic Signature(s) Signed: 12/09/2019 4:51:51 PM By: Grover Canavan Entered By: Grover Canavan on 12/09/2019 15:08:11 [name] ([id number]) -------------------------------------------------------------------------------- Lower Extremity Assessment Details Patient Name: [name]. Date of Service: 12/09/2019 2:15 PM Medical Record Number: [id number] Patient Account Number: [id number] Date of Birth/Sex: 04/27/1929 (86 y.o. F) Treating RN: Cornell Barman Primary Care Myishia Kasik: Ria Bush Other Clinician: Referring Biviana Saddler: Ria Bush Treating Delores Thelen/Extender: Melburn Hake, HOYT Weeks in Treatment: 8 Edema Assessment Assessed: [Left: No] [Right: Yes] Edema: [Left: Ye] [Right:  s] Calf Left: Right: Point of Measurement: 30 cm From Medial Instep cm 28.1 cm Ankle Left: Right: Point of Measurement: 10 cm From Medial Instep cm 17.7 cm Vascular Assessment Pulses: Dorsalis Pedis Palpable: [Right:Yes] Posterior Tibial Palpable: [Right:Yes] Electronic Signature(s) Signed: 12/09/2019 4:36:13 PM By: Darci Needle Signed: 12/09/2019 6:11:40 PM By: Gretta Cool, BSN, RN, CWS, Kim RN, BSN Entered By: Darci Needle on 12/09/2019 14:36:56 [name] ([id number]) -------------------------------------------------------------------------------- Multi Wound Chart Details Patient Name: [name]. Date of Service: 12/09/2019 2:15 PM Medical Record Number: [id number] Patient Account Number: [id number] Date of Birth/Sex: January 09, 1930 (86 y.o. F) Treating RN: Grover Canavan Primary Care Jadelynn Boylan: Danise Mina,  JAVIER Other Clinician: Referring Nadean Montanaro: Ria Bush Treating Brentney Goldbach/Extender: STONE III, HOYT Weeks in Treatment: 8 Vital Signs Height(in): 62 Pulse(bpm): 57 Weight(lbs): 140 Blood Pressure(mmHg): 169/69 Body Mass Index(BMI): 26 Temperature(F): 98.2 Respiratory Rate(breaths/min): 18 Photos: [N/A:N/A] Wound Location: Right Metatarsal head fifth N/A N/A Wounding Event: Gradually Appeared N/A N/A Primary Etiology: Arterial Insufficiency Ulcer N/A N/A Comorbid History: Arrhythmia, Osteoarthritis N/A N/A Date Acquired: 04/09/2019 N/A N/A Weeks of Treatment: 8 N/A N/A Wound Status: Open N/A N/A Measurements L x W x D (cm) 1.2x1x0.3 N/A N/A Area (cm) : 0.942 N/A N/A Volume (cm) : 0.283 N/A N/A % Reduction in Area: 16.70% N/A N/A % Reduction in Volume: -150.40% N/A N/A Classification: Full Thickness Without Exposed N/A N/A Support Structures Exudate Amount: Medium N/A N/A Exudate Type: Serosanguineous N/A N/A Exudate Color: red, brown N/A N/A Wound Margin: Distinct, outline attached N/A N/A Granulation Amount: Small (1-33%) N/A N/A Granulation  Quality: Red N/A N/A Necrotic Amount: Large (67-100%) N/A N/A Exposed Structures: Fat Layer (Subcutaneous Tissue): N/A N/A Yes Fascia: No Tendon: No Muscle: No Joint: No Bone: No Epithelialization: None N/A N/A Treatment Notes Electronic Signature(s) Signed: 12/09/2019 4:51:51 PM By: Grover Canavan Entered By: Grover Canavan on 12/09/2019 14:52:07 [name] ([id number]) -------------------------------------------------------------------------------- [place] Details Patient Name: [name]. Date of Service: 12/09/2019 2:15 PM Medical Record Number: [id number] Patient Account Number: [id number] Date of Birth/Sex: 1930-03-09 (86 y.o. F) Treating RN: Grover Canavan Primary Care Gio Janoski: Ria Bush Other Clinician: Referring Wannetta Langland: Ria Bush Treating Darlyne Schmiesing/Extender: Melburn Hake, HOYT Weeks in Treatment: 8 Active Inactive Abuse / Safety / Falls / Self Care Management Nursing Diagnoses: Potential for falls Goals: Patient/caregiver will verbalize understanding of skin care regimen Date Initiated: 10/12/2019 Target Resolution Date: 11/09/2019 Goal Status: Active Interventions: Assess fall risk on admission and as needed Notes: Necrotic Tissue Nursing Diagnoses: Impaired tissue integrity related to necrotic/devitalized tissue Goals: Necrotic/devitalized tissue will be minimized in the wound bed Date Initiated: 10/12/2019 Target Resolution Date: 11/09/2019 Goal Status: Active Interventions: Assess patient pain level pre-, during and post procedure and prior to discharge Treatment Activities: Enzymatic debridement : 10/12/2019 Notes: Orientation to the Wound Care Program Nursing Diagnoses: Knowledge deficit related to the wound healing center program Goals: Patient/caregiver will verbalize understanding of the [facility] Date Initiated: 10/12/2019 Target Resolution Date: 11/09/2019 Goal Status:  Active Interventions: Provide education on orientation to the wound center Notes: Pain, Acute or Chronic Nursing Diagnoses: Potential alteration in comfort, pain Goals: Patient will verbalize adequate pain control and receive pain control interventions during procedures as needed ADAEZE, [name] ([id number]) Date Initiated: 10/12/2019 Target Resolution Date: 11/09/2019 Goal Status: Active Interventions: Complete pain assessment as per visit requirements Treatment Activities: Administer pain control measures as ordered : 10/12/2019 Notes: Peripheral Neuropathy Nursing Diagnoses: Knowledge deficit related to disease process and management of peripheral neurovascular dysfunction Potential alteration in peripheral tissue perfusion (select prior to confirmation of diagnosis) Goals: Patient/caregiver will verbalize understanding of disease process and disease management Date Initiated: 10/12/2019 Target Resolution Date: 11/09/2019 Goal Status: Active Interventions: Assess signs and symptoms of neuropathy upon admission and as needed Provide education on Management of Neuropathy and Related Ulcers Notes: Wound/Skin Impairment Nursing Diagnoses: Impaired tissue integrity Goals: Ulcer/skin breakdown will have a volume reduction of 30% by week 4 Date Initiated: 10/12/2019 Target Resolution Date: 11/09/2019 Goal Status: Active Interventions: Assess ulceration(s) every visit Treatment Activities: Skin care regimen initiated : 10/12/2019 Topical wound management initiated : 10/12/2019 Notes: Electronic Signature(s) Signed: 12/09/2019 4:51:51 PM  By: Grover Canavan Entered By: Grover Canavan on 12/09/2019 14:51:51 [name] ([id number]) -------------------------------------------------------------------------------- Pain Assessment Details Patient Name: [name] Date of Service: 12/09/2019 2:15 PM Medical Record Number: [id number] Patient Account Number: [id number] Date of  Birth/Sex: 03-09-1930 (86 y.o. F) Treating RN: Cornell Barman Primary Care Frederica Chrestman: Ria Bush Other Clinician: Referring Akeela Busk: Ria Bush Treating Berry Gallacher/Extender: Melburn Hake, HOYT Weeks in Treatment: 8 Active Problems Location of Pain Severity and Description of Pain Patient Has Paino No Site Locations With Dressing Change: No Pain Management and Medication Current Pain Management: Electronic Signature(s) Signed: 12/09/2019 4:36:13 PM By: Darci Needle Signed: 12/09/2019 6:11:40 PM By: Gretta Cool, BSN, RN, CWS, Kim RN, BSN Entered By: Darci Needle on 12/09/2019 14:30:22 [name] ([id number]) -------------------------------------------------------------------------------- Patient/Caregiver Education Details Patient Name: [name] Date of Service: 12/09/2019 2:15 PM Medical Record Number: [id number] Patient Account Number: [id number] Date of Birth/Gender: [birth year] (86 y.o. F) Treating RN: Grover Canavan Primary Care Physician: Ria Bush Other Clinician: Referring Physician: Ria Bush Treating Physician/Extender: Sharalyn Ink in Treatment: 8 Education Assessment Education Provided To: Patient Education Topics Provided Wound/Skin Impairment: Handouts: Caring for Your Ulcer Methods: Explain/Verbal Responses: State content correctly Electronic Signature(s) Signed: 12/09/2019 4:51:51 PM By: Grover Canavan Entered By: Grover Canavan on 12/09/2019 15:06:38 [name] ([id number]) -------------------------------------------------------------------------------- Wound Assessment Details Patient Name: [name]. Date of Service: 12/09/2019 2:15 PM Medical Record Number: [id number] Patient Account Number: [id number] Date of Birth/Sex: 1929-09-13 (86 y.o. F) Treating RN: Cornell Barman Primary Care Warnie Belair: Ria Bush Other Clinician: Referring Bristol Osentoski: Ria Bush Treating Brisa Auth/Extender: Melburn Hake,  HOYT Weeks in Treatment: 8 Wound Status Wound Number: 3 Primary Etiology: Arterial Insufficiency Ulcer Wound Location: Right Metatarsal head fifth Wound Status: Open Wounding Event: Gradually Appeared Comorbid History: Arrhythmia, Osteoarthritis Date Acquired: 04/09/2019 Weeks Of Treatment: 8 Clustered Wound: No Photos Wound Measurements Length: (cm) 1.2 Width: (cm) 1 Depth: (cm) 0.3 Area: (cm) 0.942 Volume: (cm) 0.283 % Reduction in Area: 16.7% % Reduction in Volume: -150.4% Epithelialization: None Wound Description Classification: Full Thickness Without Exposed Support Structu Wound Margin: Distinct, outline attached Exudate Amount: Medium Exudate Type: Serosanguineous Exudate Color: red, brown res Foul Odor After Cleansing: No Slough/Fibrino Yes Wound Bed Granulation Amount: Small (1-33%) Exposed Structure Granulation Quality: Red Fascia Exposed: No Necrotic Amount: Large (67-100%) Fat Layer (Subcutaneous Tissue) Exposed: Yes Necrotic Quality: Adherent Slough Tendon Exposed: No Muscle Exposed: No Joint Exposed: No Bone Exposed: No Treatment Notes Wound #3 (Right Metatarsal head fifth) Notes iodoflex, abd, heel cup, 3LR, NO unna achor. Electronic Signature(s) Signed: 12/09/2019 4:36:13 PM By: Clarita Leber ([id number]) Signed: 12/09/2019 6:11:40 PM By: Gretta Cool, BSN, RN, CWS, Kim RN, BSN Entered By: Darci Needle on 12/09/2019 14:35:14 SUSANNAH, CARBIN ([id number]) -------------------------------------------------------------------------------- [place] Details Patient Name: EDISON, WOLLSCHLAGER. Date of Service: 12/09/2019 2:15 PM Medical Record Number: [id number] Patient Account Number: [id number] Date of Birth/Sex: [birth year] (86 y.o. F) Treating RN: Cornell Barman Primary Care Maryfer Tauzin: Ria Bush Other Clinician: Referring Kellene Mccleary: Ria Bush Treating Kemyra August/Extender: Melburn Hake, HOYT Weeks in Treatment: 8 Vital Signs Time Taken:  02:15 Temperature (F): 98.2 Height (in): 62 Pulse (bpm): 57 Weight (lbs): 140 Respiratory Rate (breaths/min): 18 Body Mass Index (BMI): 25.6 Blood Pressure (mmHg): 169/69 Reference Range: 80 - 120 mg / dl Electronic Signature(s) Signed: 12/09/2019 4:36:13 PM By: Darci Needle Entered By: Darci Needle on 12/09/2019 14:30:10

## 2019-12-11 DIAGNOSIS — I872 Venous insufficiency (chronic) (peripheral): Secondary | ICD-10-CM | POA: Diagnosis not present

## 2019-12-13 DIAGNOSIS — M5136 Other intervertebral disc degeneration, lumbar region: Secondary | ICD-10-CM | POA: Diagnosis not present

## 2019-12-13 DIAGNOSIS — E559 Vitamin D deficiency, unspecified: Secondary | ICD-10-CM | POA: Diagnosis not present

## 2019-12-13 DIAGNOSIS — Z7952 Long term (current) use of systemic steroids: Secondary | ICD-10-CM | POA: Diagnosis not present

## 2019-12-13 DIAGNOSIS — Z9181 History of falling: Secondary | ICD-10-CM | POA: Diagnosis not present

## 2019-12-13 DIAGNOSIS — I130 Hypertensive heart and chronic kidney disease with heart failure and stage 1 through stage 4 chronic kidney disease, or unspecified chronic kidney disease: Secondary | ICD-10-CM | POA: Diagnosis not present

## 2019-12-13 DIAGNOSIS — I2510 Atherosclerotic heart disease of native coronary artery without angina pectoris: Secondary | ICD-10-CM | POA: Diagnosis not present

## 2019-12-13 DIAGNOSIS — I4891 Unspecified atrial fibrillation: Secondary | ICD-10-CM | POA: Diagnosis not present

## 2019-12-13 DIAGNOSIS — I872 Venous insufficiency (chronic) (peripheral): Secondary | ICD-10-CM | POA: Diagnosis not present

## 2019-12-13 DIAGNOSIS — E538 Deficiency of other specified B group vitamins: Secondary | ICD-10-CM | POA: Diagnosis not present

## 2019-12-13 DIAGNOSIS — G9009 Other idiopathic peripheral autonomic neuropathy: Secondary | ICD-10-CM | POA: Diagnosis not present

## 2019-12-13 DIAGNOSIS — I739 Peripheral vascular disease, unspecified: Secondary | ICD-10-CM | POA: Diagnosis not present

## 2019-12-13 DIAGNOSIS — E7849 Other hyperlipidemia: Secondary | ICD-10-CM | POA: Diagnosis not present

## 2019-12-13 DIAGNOSIS — M810 Age-related osteoporosis without current pathological fracture: Secondary | ICD-10-CM | POA: Diagnosis not present

## 2019-12-13 DIAGNOSIS — Z7982 Long term (current) use of aspirin: Secondary | ICD-10-CM | POA: Diagnosis not present

## 2019-12-13 DIAGNOSIS — N1831 Chronic kidney disease, stage 3a: Secondary | ICD-10-CM | POA: Diagnosis not present

## 2019-12-13 DIAGNOSIS — I272 Pulmonary hypertension, unspecified: Secondary | ICD-10-CM | POA: Diagnosis not present

## 2019-12-13 DIAGNOSIS — Z7901 Long term (current) use of anticoagulants: Secondary | ICD-10-CM | POA: Diagnosis not present

## 2019-12-13 DIAGNOSIS — G2581 Restless legs syndrome: Secondary | ICD-10-CM | POA: Diagnosis not present

## 2019-12-13 DIAGNOSIS — L97513 Non-pressure chronic ulcer of other part of right foot with necrosis of muscle: Secondary | ICD-10-CM | POA: Diagnosis not present

## 2019-12-13 DIAGNOSIS — I5032 Chronic diastolic (congestive) heart failure: Secondary | ICD-10-CM | POA: Diagnosis not present

## 2019-12-13 DIAGNOSIS — S62102D Fracture of unspecified carpal bone, left wrist, subsequent encounter for fracture with routine healing: Secondary | ICD-10-CM | POA: Diagnosis not present

## 2019-12-13 DIAGNOSIS — K219 Gastro-esophageal reflux disease without esophagitis: Secondary | ICD-10-CM | POA: Diagnosis not present

## 2019-12-13 DIAGNOSIS — M1990 Unspecified osteoarthritis, unspecified site: Secondary | ICD-10-CM | POA: Diagnosis not present

## 2019-12-14 DIAGNOSIS — I5032 Chronic diastolic (congestive) heart failure: Secondary | ICD-10-CM | POA: Diagnosis not present

## 2019-12-14 DIAGNOSIS — Z7901 Long term (current) use of anticoagulants: Secondary | ICD-10-CM | POA: Diagnosis not present

## 2019-12-14 DIAGNOSIS — I739 Peripheral vascular disease, unspecified: Secondary | ICD-10-CM | POA: Diagnosis not present

## 2019-12-14 DIAGNOSIS — Z7982 Long term (current) use of aspirin: Secondary | ICD-10-CM | POA: Diagnosis not present

## 2019-12-14 DIAGNOSIS — N1831 Chronic kidney disease, stage 3a: Secondary | ICD-10-CM | POA: Diagnosis not present

## 2019-12-14 DIAGNOSIS — E7849 Other hyperlipidemia: Secondary | ICD-10-CM | POA: Diagnosis not present

## 2019-12-14 DIAGNOSIS — I4891 Unspecified atrial fibrillation: Secondary | ICD-10-CM | POA: Diagnosis not present

## 2019-12-14 DIAGNOSIS — I872 Venous insufficiency (chronic) (peripheral): Secondary | ICD-10-CM | POA: Diagnosis not present

## 2019-12-14 DIAGNOSIS — S62102D Fracture of unspecified carpal bone, left wrist, subsequent encounter for fracture with routine healing: Secondary | ICD-10-CM | POA: Diagnosis not present

## 2019-12-14 DIAGNOSIS — E559 Vitamin D deficiency, unspecified: Secondary | ICD-10-CM | POA: Diagnosis not present

## 2019-12-14 DIAGNOSIS — I272 Pulmonary hypertension, unspecified: Secondary | ICD-10-CM | POA: Diagnosis not present

## 2019-12-14 DIAGNOSIS — I2510 Atherosclerotic heart disease of native coronary artery without angina pectoris: Secondary | ICD-10-CM | POA: Diagnosis not present

## 2019-12-14 DIAGNOSIS — M1990 Unspecified osteoarthritis, unspecified site: Secondary | ICD-10-CM | POA: Diagnosis not present

## 2019-12-14 DIAGNOSIS — M810 Age-related osteoporosis without current pathological fracture: Secondary | ICD-10-CM | POA: Diagnosis not present

## 2019-12-14 DIAGNOSIS — G9009 Other idiopathic peripheral autonomic neuropathy: Secondary | ICD-10-CM | POA: Diagnosis not present

## 2019-12-14 DIAGNOSIS — M5136 Other intervertebral disc degeneration, lumbar region: Secondary | ICD-10-CM | POA: Diagnosis not present

## 2019-12-14 DIAGNOSIS — L97513 Non-pressure chronic ulcer of other part of right foot with necrosis of muscle: Secondary | ICD-10-CM | POA: Diagnosis not present

## 2019-12-14 DIAGNOSIS — Z7952 Long term (current) use of systemic steroids: Secondary | ICD-10-CM | POA: Diagnosis not present

## 2019-12-14 DIAGNOSIS — Z9181 History of falling: Secondary | ICD-10-CM | POA: Diagnosis not present

## 2019-12-14 DIAGNOSIS — K219 Gastro-esophageal reflux disease without esophagitis: Secondary | ICD-10-CM | POA: Diagnosis not present

## 2019-12-14 DIAGNOSIS — G2581 Restless legs syndrome: Secondary | ICD-10-CM | POA: Diagnosis not present

## 2019-12-14 DIAGNOSIS — E538 Deficiency of other specified B group vitamins: Secondary | ICD-10-CM | POA: Diagnosis not present

## 2019-12-14 DIAGNOSIS — I130 Hypertensive heart and chronic kidney disease with heart failure and stage 1 through stage 4 chronic kidney disease, or unspecified chronic kidney disease: Secondary | ICD-10-CM | POA: Diagnosis not present

## 2019-12-14 NOTE — Telephone Encounter (Signed)
Lvm asking pt/pt's daughter, [name] (on dpr), to call back.  See Dr. Synthia Innocent message below.

## 2019-12-15 ENCOUNTER — Telehealth: Payer: Self-pay

## 2019-12-15 DIAGNOSIS — S62102D Fracture of unspecified carpal bone, left wrist, subsequent encounter for fracture with routine healing: Secondary | ICD-10-CM | POA: Diagnosis not present

## 2019-12-15 DIAGNOSIS — Z7952 Long term (current) use of systemic steroids: Secondary | ICD-10-CM | POA: Diagnosis not present

## 2019-12-15 DIAGNOSIS — K219 Gastro-esophageal reflux disease without esophagitis: Secondary | ICD-10-CM | POA: Diagnosis not present

## 2019-12-15 DIAGNOSIS — E559 Vitamin D deficiency, unspecified: Secondary | ICD-10-CM | POA: Diagnosis not present

## 2019-12-15 DIAGNOSIS — M5136 Other intervertebral disc degeneration, lumbar region: Secondary | ICD-10-CM | POA: Diagnosis not present

## 2019-12-15 DIAGNOSIS — M1990 Unspecified osteoarthritis, unspecified site: Secondary | ICD-10-CM | POA: Diagnosis not present

## 2019-12-15 DIAGNOSIS — I272 Pulmonary hypertension, unspecified: Secondary | ICD-10-CM | POA: Diagnosis not present

## 2019-12-15 DIAGNOSIS — Z9181 History of falling: Secondary | ICD-10-CM | POA: Diagnosis not present

## 2019-12-15 DIAGNOSIS — Z7901 Long term (current) use of anticoagulants: Secondary | ICD-10-CM | POA: Diagnosis not present

## 2019-12-15 DIAGNOSIS — I4891 Unspecified atrial fibrillation: Secondary | ICD-10-CM | POA: Diagnosis not present

## 2019-12-15 DIAGNOSIS — I872 Venous insufficiency (chronic) (peripheral): Secondary | ICD-10-CM | POA: Diagnosis not present

## 2019-12-15 DIAGNOSIS — L97513 Non-pressure chronic ulcer of other part of right foot with necrosis of muscle: Secondary | ICD-10-CM | POA: Diagnosis not present

## 2019-12-15 DIAGNOSIS — I5032 Chronic diastolic (congestive) heart failure: Secondary | ICD-10-CM | POA: Diagnosis not present

## 2019-12-15 DIAGNOSIS — I2510 Atherosclerotic heart disease of native coronary artery without angina pectoris: Secondary | ICD-10-CM | POA: Diagnosis not present

## 2019-12-15 DIAGNOSIS — I739 Peripheral vascular disease, unspecified: Secondary | ICD-10-CM | POA: Diagnosis not present

## 2019-12-15 DIAGNOSIS — G2581 Restless legs syndrome: Secondary | ICD-10-CM | POA: Diagnosis not present

## 2019-12-15 DIAGNOSIS — E538 Deficiency of other specified B group vitamins: Secondary | ICD-10-CM | POA: Diagnosis not present

## 2019-12-15 DIAGNOSIS — E7849 Other hyperlipidemia: Secondary | ICD-10-CM | POA: Diagnosis not present

## 2019-12-15 DIAGNOSIS — Z7982 Long term (current) use of aspirin: Secondary | ICD-10-CM | POA: Diagnosis not present

## 2019-12-15 DIAGNOSIS — I130 Hypertensive heart and chronic kidney disease with heart failure and stage 1 through stage 4 chronic kidney disease, or unspecified chronic kidney disease: Secondary | ICD-10-CM | POA: Diagnosis not present

## 2019-12-15 DIAGNOSIS — M810 Age-related osteoporosis without current pathological fracture: Secondary | ICD-10-CM | POA: Diagnosis not present

## 2019-12-15 DIAGNOSIS — N1831 Chronic kidney disease, stage 3a: Secondary | ICD-10-CM | POA: Diagnosis not present

## 2019-12-15 DIAGNOSIS — G9009 Other idiopathic peripheral autonomic neuropathy: Secondary | ICD-10-CM | POA: Diagnosis not present

## 2019-12-15 NOTE — Telephone Encounter (Signed)
Signed and in [name]'s box.

## 2019-12-15 NOTE — Telephone Encounter (Signed)
Received faxed Detailed Written Order from [facility] for wound care.  Placed order in Dr. Synthia Innocent box.

## 2019-12-15 NOTE — Telephone Encounter (Signed)
Spoke with pt's daughter, [name] (on dpr), asking for update.  Says pt's BP readings have been up and down.  Seems to go up when she is stirred up.  States pt is forgetting to meds sometimes.   And is slow about getting something to eat.   Leg swelling controlled with compression wrap and elevation.   Swells a little when not elevated.

## 2019-12-16 ENCOUNTER — Ambulatory Visit: Payer: Medicare Other

## 2019-12-16 NOTE — Telephone Encounter (Signed)
Faxed orders

## 2019-12-17 DIAGNOSIS — E7849 Other hyperlipidemia: Secondary | ICD-10-CM | POA: Diagnosis not present

## 2019-12-17 DIAGNOSIS — E559 Vitamin D deficiency, unspecified: Secondary | ICD-10-CM | POA: Diagnosis not present

## 2019-12-17 DIAGNOSIS — G9009 Other idiopathic peripheral autonomic neuropathy: Secondary | ICD-10-CM | POA: Diagnosis not present

## 2019-12-17 DIAGNOSIS — M1990 Unspecified osteoarthritis, unspecified site: Secondary | ICD-10-CM | POA: Diagnosis not present

## 2019-12-17 DIAGNOSIS — I739 Peripheral vascular disease, unspecified: Secondary | ICD-10-CM | POA: Diagnosis not present

## 2019-12-17 DIAGNOSIS — L97513 Non-pressure chronic ulcer of other part of right foot with necrosis of muscle: Secondary | ICD-10-CM | POA: Diagnosis not present

## 2019-12-17 DIAGNOSIS — K219 Gastro-esophageal reflux disease without esophagitis: Secondary | ICD-10-CM | POA: Diagnosis not present

## 2019-12-17 DIAGNOSIS — S62102D Fracture of unspecified carpal bone, left wrist, subsequent encounter for fracture with routine healing: Secondary | ICD-10-CM | POA: Diagnosis not present

## 2019-12-17 DIAGNOSIS — N1831 Chronic kidney disease, stage 3a: Secondary | ICD-10-CM | POA: Diagnosis not present

## 2019-12-17 DIAGNOSIS — I5032 Chronic diastolic (congestive) heart failure: Secondary | ICD-10-CM | POA: Diagnosis not present

## 2019-12-17 DIAGNOSIS — M810 Age-related osteoporosis without current pathological fracture: Secondary | ICD-10-CM | POA: Diagnosis not present

## 2019-12-17 DIAGNOSIS — Z7982 Long term (current) use of aspirin: Secondary | ICD-10-CM | POA: Diagnosis not present

## 2019-12-17 DIAGNOSIS — I2510 Atherosclerotic heart disease of native coronary artery without angina pectoris: Secondary | ICD-10-CM | POA: Diagnosis not present

## 2019-12-17 DIAGNOSIS — Z9181 History of falling: Secondary | ICD-10-CM | POA: Diagnosis not present

## 2019-12-17 DIAGNOSIS — I272 Pulmonary hypertension, unspecified: Secondary | ICD-10-CM | POA: Diagnosis not present

## 2019-12-17 DIAGNOSIS — Z7952 Long term (current) use of systemic steroids: Secondary | ICD-10-CM | POA: Diagnosis not present

## 2019-12-17 DIAGNOSIS — I872 Venous insufficiency (chronic) (peripheral): Secondary | ICD-10-CM | POA: Diagnosis not present

## 2019-12-17 DIAGNOSIS — I130 Hypertensive heart and chronic kidney disease with heart failure and stage 1 through stage 4 chronic kidney disease, or unspecified chronic kidney disease: Secondary | ICD-10-CM | POA: Diagnosis not present

## 2019-12-17 DIAGNOSIS — Z7901 Long term (current) use of anticoagulants: Secondary | ICD-10-CM | POA: Diagnosis not present

## 2019-12-17 DIAGNOSIS — E538 Deficiency of other specified B group vitamins: Secondary | ICD-10-CM | POA: Diagnosis not present

## 2019-12-17 DIAGNOSIS — G2581 Restless legs syndrome: Secondary | ICD-10-CM | POA: Diagnosis not present

## 2019-12-17 DIAGNOSIS — M5136 Other intervertebral disc degeneration, lumbar region: Secondary | ICD-10-CM | POA: Diagnosis not present

## 2019-12-17 DIAGNOSIS — I4891 Unspecified atrial fibrillation: Secondary | ICD-10-CM | POA: Diagnosis not present

## 2019-12-20 DIAGNOSIS — Z7952 Long term (current) use of systemic steroids: Secondary | ICD-10-CM | POA: Diagnosis not present

## 2019-12-20 DIAGNOSIS — Z7982 Long term (current) use of aspirin: Secondary | ICD-10-CM | POA: Diagnosis not present

## 2019-12-20 DIAGNOSIS — I872 Venous insufficiency (chronic) (peripheral): Secondary | ICD-10-CM | POA: Diagnosis not present

## 2019-12-20 DIAGNOSIS — I2510 Atherosclerotic heart disease of native coronary artery without angina pectoris: Secondary | ICD-10-CM | POA: Diagnosis not present

## 2019-12-20 DIAGNOSIS — E559 Vitamin D deficiency, unspecified: Secondary | ICD-10-CM | POA: Diagnosis not present

## 2019-12-20 DIAGNOSIS — M5136 Other intervertebral disc degeneration, lumbar region: Secondary | ICD-10-CM | POA: Diagnosis not present

## 2019-12-20 DIAGNOSIS — I5032 Chronic diastolic (congestive) heart failure: Secondary | ICD-10-CM | POA: Diagnosis not present

## 2019-12-20 DIAGNOSIS — M1990 Unspecified osteoarthritis, unspecified site: Secondary | ICD-10-CM | POA: Diagnosis not present

## 2019-12-20 DIAGNOSIS — E538 Deficiency of other specified B group vitamins: Secondary | ICD-10-CM | POA: Diagnosis not present

## 2019-12-20 DIAGNOSIS — K219 Gastro-esophageal reflux disease without esophagitis: Secondary | ICD-10-CM | POA: Diagnosis not present

## 2019-12-20 DIAGNOSIS — L97513 Non-pressure chronic ulcer of other part of right foot with necrosis of muscle: Secondary | ICD-10-CM | POA: Diagnosis not present

## 2019-12-20 DIAGNOSIS — G2581 Restless legs syndrome: Secondary | ICD-10-CM | POA: Diagnosis not present

## 2019-12-20 DIAGNOSIS — I4891 Unspecified atrial fibrillation: Secondary | ICD-10-CM | POA: Diagnosis not present

## 2019-12-20 DIAGNOSIS — N1831 Chronic kidney disease, stage 3a: Secondary | ICD-10-CM | POA: Diagnosis not present

## 2019-12-20 DIAGNOSIS — Z7901 Long term (current) use of anticoagulants: Secondary | ICD-10-CM | POA: Diagnosis not present

## 2019-12-20 DIAGNOSIS — E7849 Other hyperlipidemia: Secondary | ICD-10-CM | POA: Diagnosis not present

## 2019-12-20 DIAGNOSIS — I739 Peripheral vascular disease, unspecified: Secondary | ICD-10-CM | POA: Diagnosis not present

## 2019-12-20 DIAGNOSIS — Z9181 History of falling: Secondary | ICD-10-CM | POA: Diagnosis not present

## 2019-12-20 DIAGNOSIS — I130 Hypertensive heart and chronic kidney disease with heart failure and stage 1 through stage 4 chronic kidney disease, or unspecified chronic kidney disease: Secondary | ICD-10-CM | POA: Diagnosis not present

## 2019-12-20 DIAGNOSIS — G9009 Other idiopathic peripheral autonomic neuropathy: Secondary | ICD-10-CM | POA: Diagnosis not present

## 2019-12-20 DIAGNOSIS — I272 Pulmonary hypertension, unspecified: Secondary | ICD-10-CM | POA: Diagnosis not present

## 2019-12-20 DIAGNOSIS — S62102D Fracture of unspecified carpal bone, left wrist, subsequent encounter for fracture with routine healing: Secondary | ICD-10-CM | POA: Diagnosis not present

## 2019-12-20 DIAGNOSIS — M810 Age-related osteoporosis without current pathological fracture: Secondary | ICD-10-CM | POA: Diagnosis not present

## 2019-12-21 DIAGNOSIS — I130 Hypertensive heart and chronic kidney disease with heart failure and stage 1 through stage 4 chronic kidney disease, or unspecified chronic kidney disease: Secondary | ICD-10-CM | POA: Diagnosis not present

## 2019-12-21 DIAGNOSIS — E559 Vitamin D deficiency, unspecified: Secondary | ICD-10-CM | POA: Diagnosis not present

## 2019-12-21 DIAGNOSIS — S62102D Fracture of unspecified carpal bone, left wrist, subsequent encounter for fracture with routine healing: Secondary | ICD-10-CM | POA: Diagnosis not present

## 2019-12-21 DIAGNOSIS — M810 Age-related osteoporosis without current pathological fracture: Secondary | ICD-10-CM | POA: Diagnosis not present

## 2019-12-21 DIAGNOSIS — Z7982 Long term (current) use of aspirin: Secondary | ICD-10-CM | POA: Diagnosis not present

## 2019-12-21 DIAGNOSIS — I5032 Chronic diastolic (congestive) heart failure: Secondary | ICD-10-CM | POA: Diagnosis not present

## 2019-12-21 DIAGNOSIS — M1990 Unspecified osteoarthritis, unspecified site: Secondary | ICD-10-CM | POA: Diagnosis not present

## 2019-12-21 DIAGNOSIS — I739 Peripheral vascular disease, unspecified: Secondary | ICD-10-CM | POA: Diagnosis not present

## 2019-12-21 DIAGNOSIS — I2510 Atherosclerotic heart disease of native coronary artery without angina pectoris: Secondary | ICD-10-CM | POA: Diagnosis not present

## 2019-12-21 DIAGNOSIS — G9009 Other idiopathic peripheral autonomic neuropathy: Secondary | ICD-10-CM | POA: Diagnosis not present

## 2019-12-21 DIAGNOSIS — Z7952 Long term (current) use of systemic steroids: Secondary | ICD-10-CM | POA: Diagnosis not present

## 2019-12-21 DIAGNOSIS — K219 Gastro-esophageal reflux disease without esophagitis: Secondary | ICD-10-CM | POA: Diagnosis not present

## 2019-12-21 DIAGNOSIS — Z9181 History of falling: Secondary | ICD-10-CM | POA: Diagnosis not present

## 2019-12-21 DIAGNOSIS — I872 Venous insufficiency (chronic) (peripheral): Secondary | ICD-10-CM | POA: Diagnosis not present

## 2019-12-21 DIAGNOSIS — L97513 Non-pressure chronic ulcer of other part of right foot with necrosis of muscle: Secondary | ICD-10-CM | POA: Diagnosis not present

## 2019-12-21 DIAGNOSIS — I4891 Unspecified atrial fibrillation: Secondary | ICD-10-CM | POA: Diagnosis not present

## 2019-12-21 DIAGNOSIS — Z7901 Long term (current) use of anticoagulants: Secondary | ICD-10-CM | POA: Diagnosis not present

## 2019-12-21 DIAGNOSIS — N1831 Chronic kidney disease, stage 3a: Secondary | ICD-10-CM | POA: Diagnosis not present

## 2019-12-21 DIAGNOSIS — G2581 Restless legs syndrome: Secondary | ICD-10-CM | POA: Diagnosis not present

## 2019-12-21 DIAGNOSIS — E538 Deficiency of other specified B group vitamins: Secondary | ICD-10-CM | POA: Diagnosis not present

## 2019-12-21 DIAGNOSIS — E7849 Other hyperlipidemia: Secondary | ICD-10-CM | POA: Diagnosis not present

## 2019-12-21 DIAGNOSIS — I272 Pulmonary hypertension, unspecified: Secondary | ICD-10-CM | POA: Diagnosis not present

## 2019-12-21 DIAGNOSIS — M5136 Other intervertebral disc degeneration, lumbar region: Secondary | ICD-10-CM | POA: Diagnosis not present

## 2019-12-23 ENCOUNTER — Ambulatory Visit: Payer: Medicare Other | Admitting: Physician Assistant

## 2019-12-23 DIAGNOSIS — I272 Pulmonary hypertension, unspecified: Secondary | ICD-10-CM | POA: Diagnosis not present

## 2019-12-23 DIAGNOSIS — K219 Gastro-esophageal reflux disease without esophagitis: Secondary | ICD-10-CM | POA: Diagnosis not present

## 2019-12-23 DIAGNOSIS — N1831 Chronic kidney disease, stage 3a: Secondary | ICD-10-CM | POA: Diagnosis not present

## 2019-12-23 DIAGNOSIS — I4891 Unspecified atrial fibrillation: Secondary | ICD-10-CM | POA: Diagnosis not present

## 2019-12-23 DIAGNOSIS — E559 Vitamin D deficiency, unspecified: Secondary | ICD-10-CM | POA: Diagnosis not present

## 2019-12-23 DIAGNOSIS — G9009 Other idiopathic peripheral autonomic neuropathy: Secondary | ICD-10-CM | POA: Diagnosis not present

## 2019-12-23 DIAGNOSIS — Z7952 Long term (current) use of systemic steroids: Secondary | ICD-10-CM | POA: Diagnosis not present

## 2019-12-23 DIAGNOSIS — M810 Age-related osteoporosis without current pathological fracture: Secondary | ICD-10-CM | POA: Diagnosis not present

## 2019-12-23 DIAGNOSIS — G2581 Restless legs syndrome: Secondary | ICD-10-CM | POA: Diagnosis not present

## 2019-12-23 DIAGNOSIS — M5136 Other intervertebral disc degeneration, lumbar region: Secondary | ICD-10-CM | POA: Diagnosis not present

## 2019-12-23 DIAGNOSIS — I739 Peripheral vascular disease, unspecified: Secondary | ICD-10-CM | POA: Diagnosis not present

## 2019-12-23 DIAGNOSIS — I2510 Atherosclerotic heart disease of native coronary artery without angina pectoris: Secondary | ICD-10-CM | POA: Diagnosis not present

## 2019-12-23 DIAGNOSIS — L97513 Non-pressure chronic ulcer of other part of right foot with necrosis of muscle: Secondary | ICD-10-CM | POA: Diagnosis not present

## 2019-12-23 DIAGNOSIS — E538 Deficiency of other specified B group vitamins: Secondary | ICD-10-CM | POA: Diagnosis not present

## 2019-12-23 DIAGNOSIS — Z9181 History of falling: Secondary | ICD-10-CM | POA: Diagnosis not present

## 2019-12-23 DIAGNOSIS — S62102D Fracture of unspecified carpal bone, left wrist, subsequent encounter for fracture with routine healing: Secondary | ICD-10-CM | POA: Diagnosis not present

## 2019-12-23 DIAGNOSIS — I5032 Chronic diastolic (congestive) heart failure: Secondary | ICD-10-CM | POA: Diagnosis not present

## 2019-12-23 DIAGNOSIS — Z7982 Long term (current) use of aspirin: Secondary | ICD-10-CM | POA: Diagnosis not present

## 2019-12-23 DIAGNOSIS — E7849 Other hyperlipidemia: Secondary | ICD-10-CM | POA: Diagnosis not present

## 2019-12-23 DIAGNOSIS — Z7901 Long term (current) use of anticoagulants: Secondary | ICD-10-CM | POA: Diagnosis not present

## 2019-12-23 DIAGNOSIS — I872 Venous insufficiency (chronic) (peripheral): Secondary | ICD-10-CM | POA: Diagnosis not present

## 2019-12-23 DIAGNOSIS — M1990 Unspecified osteoarthritis, unspecified site: Secondary | ICD-10-CM | POA: Diagnosis not present

## 2019-12-23 DIAGNOSIS — I130 Hypertensive heart and chronic kidney disease with heart failure and stage 1 through stage 4 chronic kidney disease, or unspecified chronic kidney disease: Secondary | ICD-10-CM | POA: Diagnosis not present

## 2019-12-24 ENCOUNTER — Other Ambulatory Visit: Payer: Self-pay

## 2019-12-24 ENCOUNTER — Encounter: Payer: Medicare Other | Admitting: Physician Assistant

## 2019-12-24 ENCOUNTER — Other Ambulatory Visit
Admission: RE | Admit: 2019-12-24 | Discharge: 2019-12-24 | Disposition: A | Discharge status: Home / Self Care | Payer: Medicare Other | Source: Ambulatory Visit | Attending: Physician Assistant | Admitting: Physician Assistant

## 2019-12-24 ENCOUNTER — Telehealth: Payer: Self-pay

## 2019-12-24 DIAGNOSIS — I739 Peripheral vascular disease, unspecified: Secondary | ICD-10-CM | POA: Diagnosis not present

## 2019-12-24 DIAGNOSIS — I480 Paroxysmal atrial fibrillation: Secondary | ICD-10-CM | POA: Diagnosis not present

## 2019-12-24 DIAGNOSIS — L97512 Non-pressure chronic ulcer of other part of right foot with fat layer exposed: Secondary | ICD-10-CM | POA: Diagnosis not present

## 2019-12-24 DIAGNOSIS — I70235 Atherosclerosis of native arteries of right leg with ulceration of other part of foot: Secondary | ICD-10-CM | POA: Diagnosis not present

## 2019-12-24 DIAGNOSIS — M1990 Unspecified osteoarthritis, unspecified site: Secondary | ICD-10-CM | POA: Diagnosis not present

## 2019-12-24 DIAGNOSIS — L089 Local infection of the skin and subcutaneous tissue, unspecified: Secondary | ICD-10-CM | POA: Insufficient documentation

## 2019-12-24 DIAGNOSIS — Z7901 Long term (current) use of anticoagulants: Secondary | ICD-10-CM | POA: Diagnosis not present

## 2019-12-24 NOTE — Telephone Encounter (Signed)
Wanted to let Dr Darnell Level know they are going to want to discuss palliative care and/or in home care for the patient because she is living at home by herself. Also will need a rx for a push wheelchair or a transport chair. She has an appt here on Monday. Wanted to give Dr Darnell Level a heads up. She is not walking on her own due to the foot issue.

## 2019-12-24 NOTE — Progress Notes (Signed)
[name], [name] ([id number]) Visit Report for 12/24/2019 Arrival Information Details Patient Name: [name], [name]. Date of Service: 12/24/2019 12:30 PM Medical Record Number: [id number] Patient Account Number: [id number] Date of Birth/Sex: 1929-06-13 (86 y.o. F) Treating RN: Grover Canavan Primary Care Leola Fiore: Ria Bush Other Clinician: Referring Rodneshia Greenhouse: Ria Bush Treating Ashleynicole Mcclees/Extender: Melburn Hake, HOYT Weeks in Treatment: 10 Visit Information History Since Last Visit Added or deleted any medications: No Patient Arrived: Wheel Chair Any new allergies or adverse reactions: No Arrival Time: 12:51 Had a fall or experienced change in No Accompanied By: daughter activities of daily living that may affect Transfer Assistance: Manual risk of falls: Patient Requires Transmission-Based No Signs or symptoms of abuse/neglect since last visito No Precautions: Hospitalized since last visit: No Patient Has Alerts: Yes Implantable device outside of the clinic excluding No Patient Alerts: 08/31/19 ABI R)1.3 L) cellular tissue based products placed in the center 1.31 since last visit: 08/31/19 TBI R).61 Has Dressing in Place as Prescribed: Yes L).58 Pain Present Now: Yes Electronic Signature(s) Signed: 12/24/2019 1:13:46 PM By: Lorine Bears RCP, RRT, CHT Entered By: Lorine Bears on 12/24/2019 12:52:27 [name] ([id number]) -------------------------------------------------------------------------------- Compression Therapy Details Patient Name: [name] Date of Service: 12/24/2019 12:30 PM Medical Record Number: [id number] Patient Account Number: [id number] Date of Birth/Sex: 1929/06/02 (86 y.o. F) Treating RN: Grover Canavan Primary Care Ida Uppal: Ria Bush Other Clinician: Referring Lily Kernen: Ria Bush Treating Ishan Sanroman/Extender: Melburn Hake, HOYT Weeks in Treatment: 10 Compression Therapy Performed  for Wound Assessment: Wound #3 Right Metatarsal head fifth Performed By: Clinician Grover Canavan, RN Compression Type: Three Layer Post Procedure Diagnosis Same as Pre-procedure Electronic Signature(s) Signed: 12/24/2019 4:35:13 PM By: Grover Canavan Entered By: Grover Canavan on 12/24/2019 13:38:22 [name], [name] ([id number]) -------------------------------------------------------------------------------- Encounter Discharge Information Details Patient Name: [name] Date of Service: 12/24/2019 12:30 PM Medical Record Number: [id number] Patient Account Number: [id number] Date of Birth/Sex: 04/05/1930 (86 y.o. F) Treating RN: Grover Canavan Primary Care Kaprice Kage: Ria Bush Other Clinician: Referring Natash Berman: Ria Bush Treating Julieanne Hadsall/Extender: Melburn Hake, HOYT Weeks in Treatment: 10 Encounter Discharge Information Items Discharge Condition: Stable Ambulatory Status: Wheelchair Discharge Destination: Home Transportation: Private Auto Accompanied By: daughter Schedule Follow-up Appointment: Yes Clinical Summary of Care: Electronic Signature(s) Signed: 12/24/2019 4:35:13 PM By: Grover Canavan Entered By: Grover Canavan on 12/24/2019 13:39:31 [name] ([id number]) -------------------------------------------------------------------------------- Lower Extremity Assessment Details Patient Name: [name]. Date of Service: 12/24/2019 12:30 PM Medical Record Number: [id number] Patient Account Number: [id number] Date of Birth/Sex: 1929/06/13 (85 y.o. F) Treating RN: Grover Canavan Primary Care Rosemarie Galvis: Ria Bush Other Clinician: Referring Kharizma Lesnick: Ria Bush Treating Debbi Strandberg/Extender: Melburn Hake, HOYT Weeks in Treatment: 10 Edema Assessment Assessed: [Left: No] [Right: Yes] Edema: [Left: Ye] [Right: s] Calf Left: Right: Point of Measurement: cm From Medial Instep cm 24 cm Ankle Left: Right: Point of  Measurement: cm From Medial Instep cm 19 cm Vascular Assessment Pulses: Dorsalis Pedis Palpable: [Right:Yes] Doppler Audible: [Right:Yes] Posterior Tibial Palpable: [Right:Yes Yes] Electronic Signature(s) Signed: 12/24/2019 4:13:25 PM By: Darci Needle Signed: 12/24/2019 4:35:13 PM By: Grover Canavan Entered By: Darci Needle on 12/24/2019 13:06:09 [name] ([id number]) -------------------------------------------------------------------------------- Multi Wound Chart Details Patient Name: [name]. Date of Service: 12/24/2019 12:30 PM Medical Record Number: [id number] Patient Account Number: [id number] Date of Birth/Sex: January 16, 1930 (86 y.o. F) Treating RN: Grover Canavan Primary Care Tykesha Konicki: Ria Bush Other Clinician: Referring Kmari Halter: Ria Bush Treating Juquan Reznick/Extender: STONE III, HOYT Weeks in Treatment: 10 Vital Signs Height(in): 62  Pulse(bpm): 70 Weight(lbs): 140 Blood Pressure(mmHg): 170/62 Body Mass Index(BMI): 26 Temperature(F): 98.4 Respiratory Rate(breaths/min): 18 Photos: [N/A:N/A] Wound Location: Right Metatarsal head fifth N/A N/A Wounding Event: Gradually Appeared N/A N/A Primary Etiology: Arterial Insufficiency Ulcer N/A N/A Comorbid History: Arrhythmia, Osteoarthritis N/A N/A Date Acquired: 04/09/2019 N/A N/A Weeks of Treatment: 10 N/A N/A Wound Status: Open N/A N/A Measurements L x W x D (cm) 1x1.2x0.4 N/A N/A Area (cm) : 0.942 N/A N/A Volume (cm) : 0.377 N/A N/A % Reduction in Area: 16.70% N/A N/A % Reduction in Volume: -233.60% N/A N/A Classification: Full Thickness Without Exposed N/A N/A Support Structures Exudate Amount: Medium N/A N/A Exudate Type: Serosanguineous N/A N/A Exudate Color: red, brown N/A N/A Wound Margin: Distinct, outline attached N/A N/A Granulation Amount: Small (1-33%) N/A N/A Granulation Quality: Red N/A N/A Necrotic Amount: Large (67-100%) N/A N/A Exposed Structures: Fat Layer  (Subcutaneous Tissue): N/A N/A Yes Fascia: No Tendon: No Muscle: No Joint: No Bone: No Epithelialization: None N/A N/A Treatment Notes Electronic Signature(s) Signed: 12/24/2019 4:35:13 PM By: Grover Canavan Entered By: Grover Canavan on 12/24/2019 13:20:53 [name] ([id number]) -------------------------------------------------------------------------------- Multi-Disciplinary Care Plan Details Patient Name: [name]. Date of Service: 12/24/2019 12:30 PM Medical Record Number: [id number] Patient Account Number: [id number] Date of Birth/Sex: April 19, 1929 (86 y.o. F) Treating RN: Grover Canavan Primary Care Magdalena Skilton: Ria Bush Other Clinician: Referring Mitsuye Schrodt: Ria Bush Treating Vernisha Bacote/Extender: Melburn Hake, HOYT Weeks in Treatment: 10 Active Inactive Abuse / Safety / Falls / Self Care Management Nursing Diagnoses: Potential for falls Goals: Patient/caregiver will verbalize understanding of skin care regimen Date Initiated: 10/12/2019 Target Resolution Date: 11/09/2019 Goal Status: Active Interventions: Assess fall risk on admission and as needed Notes: Necrotic Tissue Nursing Diagnoses: Impaired tissue integrity related to necrotic/devitalized tissue Goals: Necrotic/devitalized tissue will be minimized in the wound bed Date Initiated: 10/12/2019 Target Resolution Date: 11/09/2019 Goal Status: Active Interventions: Assess patient pain level pre-, during and post procedure and prior to discharge Treatment Activities: Enzymatic debridement : 10/12/2019 Notes: Orientation to the Wound Care Program Nursing Diagnoses: Knowledge deficit related to the wound healing center program Goals: Patient/caregiver will verbalize understanding of the [facility] Date Initiated: 10/12/2019 Target Resolution Date: 11/09/2019 Goal Status: Active Interventions: Provide education on orientation to the wound center Notes: Pain, Acute or  Chronic Nursing Diagnoses: Potential alteration in comfort, pain Goals: Patient will verbalize adequate pain control and receive pain control interventions during procedures as needed [name], [name] ([id number]) Date Initiated: 10/12/2019 Target Resolution Date: 11/09/2019 Goal Status: Active Interventions: Complete pain assessment as per visit requirements Treatment Activities: Administer pain control measures as ordered : 10/12/2019 Notes: Peripheral Neuropathy Nursing Diagnoses: Knowledge deficit related to disease process and management of peripheral neurovascular dysfunction Potential alteration in peripheral tissue perfusion (select prior to confirmation of diagnosis) Goals: Patient/caregiver will verbalize understanding of disease process and disease management Date Initiated: 10/12/2019 Target Resolution Date: 11/09/2019 Goal Status: Active Interventions: Assess signs and symptoms of neuropathy upon admission and as needed Provide education on Management of Neuropathy and Related Ulcers Notes: Wound/Skin Impairment Nursing Diagnoses: Impaired tissue integrity Goals: Ulcer/skin breakdown will have a volume reduction of 30% by week 4 Date Initiated: 10/12/2019 Target Resolution Date: 11/09/2019 Goal Status: Active Interventions: Assess ulceration(s) every visit Treatment Activities: Skin care regimen initiated : 10/12/2019 Topical wound management initiated : 10/12/2019 Notes: Electronic Signature(s) Signed: 12/24/2019 4:35:13 PM By: Grover Canavan Entered By: Grover Canavan on 12/24/2019 13:20:44 [name] ([id number]) -------------------------------------------------------------------------------- Pain Assessment Details Patient Name:  [name], Crystal S. Date of Service: 12/24/2019 12:30 PM Medical Record Number: [id number] Patient Account Number: [id number] Date of Birth/Sex: [birth year] (86 y.o. F) Treating RN: Grover Canavan Primary Care Astou Lada: Ria Bush Other Clinician: Referring Marilin Kofman: Ria Bush Treating Lakesha Levinson/Extender: Melburn Hake, HOYT Weeks in Treatment: 10 Active Problems Location of Pain Severity and Description of Pain Patient Has Paino Yes Site Locations Pain Location: Pain in Ulcers With Dressing Change: Yes Duration of the Pain. Constant / Intermittento Constant Rate the pain. Current Pain Level: 9 Worst Pain Level: 10 Least Pain Level: 2 Tolerable Pain Level: 2 Character of Pain Describe the Pain: Shooting, Tender, Throbbing Pain Management and Medication Current Pain Management: Notes States she takes Tylenol and applies ice pack for pain relief. Electronic Signature(s) Signed: 12/24/2019 4:13:25 PM By: Darci Needle Signed: 12/24/2019 4:35:13 PM By: Grover Canavan Entered By: Darci Needle on 12/24/2019 13:04:31 [name] ([id number]) -------------------------------------------------------------------------------- Patient/Caregiver Education Details Patient Name: [name] Date of Service: 12/24/2019 12:30 PM Medical Record Number: [id number] Patient Account Number: [id number] Date of Birth/Gender: 15-Jul-1929 (86 y.o. F) Treating RN: Grover Canavan Primary Care Physician: Ria Bush Other Clinician: Referring Physician: Ria Bush Treating Physician/Extender: Sharalyn Ink in Treatment: 10 Education Assessment Education Provided To: Patient Education Topics Provided Wound/Skin Impairment: Handouts: Caring for Your Ulcer Methods: Explain/Verbal Responses: State content correctly Electronic Signature(s) Signed: 12/24/2019 4:35:13 PM By: Grover Canavan Entered By: Grover Canavan on 12/24/2019 13:22:04 [name] ([id number]) -------------------------------------------------------------------------------- Wound Assessment Details Patient Name: [name]. Date of Service: 12/24/2019 12:30 PM Medical Record Number:  [id number] Patient Account Number: [id number] Date of Birth/Sex: Sep 10, 1929 (86 y.o. F) Treating RN: Grover Canavan Primary Care Shakeeta Godette: Ria Bush Other Clinician: Referring Jatziry Wechter: Ria Bush Treating Ninfa Giannelli/Extender: Melburn Hake, HOYT Weeks in Treatment: 10 Wound Status Wound Number: 3 Primary Etiology: Arterial Insufficiency Ulcer Wound Location: Right Metatarsal head fifth Wound Status: Open Wounding Event: Gradually Appeared Comorbid History: Arrhythmia, Osteoarthritis Date Acquired: 04/09/2019 Weeks Of Treatment: 10 Clustered Wound: No Photos Wound Measurements Length: (cm) 1 Width: (cm) 1.2 Depth: (cm) 0.4 Area: (cm) 0.942 Volume: (cm) 0.377 % Reduction in Area: 16.7% % Reduction in Volume: -233.6% Epithelialization: None Wound Description Classification: Full Thickness Without Exposed Support Structu Wound Margin: Distinct, outline attached Exudate Amount: Medium Exudate Type: Serosanguineous Exudate Color: red, brown res Foul Odor After Cleansing: No Slough/Fibrino Yes Wound Bed Granulation Amount: Small (1-33%) Exposed Structure Granulation Quality: Red Fascia Exposed: No Necrotic Amount: Large (67-100%) Fat Layer (Subcutaneous Tissue) Exposed: Yes Necrotic Quality: Adherent Slough Tendon Exposed: No Muscle Exposed: No Joint Exposed: No Bone Exposed: No Treatment Notes Wound #3 (Right Metatarsal head fifth) Notes iodoflex, abd, heel cup, 3LR, NO unna achor. Electronic Signature(s) Signed: 12/24/2019 4:13:25 PM By: Clarita Leber ([id number]) Signed: 12/24/2019 4:35:13 PM By: Grover Canavan Entered By: Darci Needle on 12/24/2019 13:05:25 [name] ([id number]) -------------------------------------------------------------------------------- [place] Details Patient Name: [name], [name]. Date of Service: 12/24/2019 12:30 PM Medical Record Number: [id number] Patient Account Number: [id number] Date of  Birth/Sex: 11/29/1929 (86 y.o. F) Treating RN: Grover Canavan Primary Care Jacon Whetzel: Ria Bush Other Clinician: Referring Renae Mottley: Ria Bush Treating Khalin Royce/Extender: Melburn Hake, HOYT Weeks in Treatment: 10 Vital Signs Time Taken: 12:50 Temperature (F): 98.4 Height (in): 62 Pulse (bpm): 70 Weight (lbs): 140 Respiratory Rate (breaths/min): 18 Body Mass Index (BMI): 25.6 Blood Pressure (mmHg): 170/62 Reference Range: 80 - 120 mg / dl Electronic Signature(s) Signed: 12/24/2019 1:13:46 PM By: Becky Sax, Sallie RCP, RRT, CHT Entered  By: Lorine Bears on 12/24/2019 12:53:03

## 2019-12-24 NOTE — Progress Notes (Addendum)
KARALINA, TIFT ([id number]) Visit Report for 12/24/2019 Chief Complaint Document Details Patient Name: [name], [name]. Date of Service: 12/24/2019 12:30 PM Medical Record Number: [id number] Patient Account Number: [id number] Date of Birth/Sex: [birth year] (86 y.o. F) Treating RN: Grover Canavan Primary Care Provider: Ria Bush Other Clinician: Referring Provider: Ria Bush Treating Provider/Extender: Melburn Hake, Anabelen Kaminsky Weeks in Treatment: 10 Information Obtained from: Patient Chief Complaint Right Lateral Foot Ulcer Electronic Signature(s) Signed: 12/24/2019 1:06:12 PM By: Worthy Keeler PA-C Entered By: Worthy Keeler on 12/24/2019 13:06:11 [name], [name] ([id number]) -------------------------------------------------------------------------------- HPI Details Patient Name: [name] Date of Service: 12/24/2019 12:30 PM Medical Record Number: [id number] Patient Account Number: [id number] Date of Birth/Sex: [birth year] (86 y.o. F) Treating RN: Grover Canavan Primary Care Provider: Ria Bush Other Clinician: Referring Provider: Ria Bush Treating Provider/Extender: Melburn Hake, Vir Whetstine Weeks in Treatment: 10 History of Present Illness HPI Description: 10/12/2019 upon evaluation today patient appears to be doing somewhat poorly in regard to her wound at this point. She has a ulcer over the right fifth metatarsal region. This has been managed by podiatry up to this point. Unfortunately the patient just does not seem to be making the progress that they were hoping for an subsequently the patient was referred to [place] by [facility] Dr. Posey Pronto. With that being said I did note that the patient does have peripheral vascular disease which has been managed by Dr. Lazaro Arms her most recent angiogram with intervention was on 08/02/2019. The ABIs following were on the right 1.30 with a TBI of 0.61 normal left 1.31 with a TBI of 0.58. Obviously things seem to  be doing somewhat better at this point hopefully enough to allow her to heal. She is on anticoagulant therapy due to atrial fibrillation long-term. With that being said the patient is unfortunately having quite a bit of discomfort at this time secondary to the wound. She also has erythema which is very likely secondary to infection. I am going obtain a culture of the area today once debridement is complete. Apparently the patient also had a x-ray that was performed by Dr. Posey Pronto on 09/21/2019 and this showed that the patient had no obvious signs on x-ray of cortical irregularity and no osteomyelitis obviously noted. With that being said as I discussed with the patient's daughter as well this does not indicate that the patient absolutely has no osteomyelitis as x-rays are only at best 50% helpful in identifying this complication. Nonetheless I do believe that we need to have the patient continue to have this further evaluated specifically I am to recommend that we check into an MRI which I think would be ideal to evaluate whether or not there is osteomyelitis present. 7/16; this is a patient with an ulcer over the right fifth metatarsal head. She follows with Dr. Lucky Cowboy [facility]. She is due to have an MRI of the foot on 7/26. Using Santyl to the wound 11/04/2019 on evaluation today patient appears to be doing okay in regard to her wound at this time. Unfortunately since I last saw her which was actually her second visit here in the clinic she has moved into a new apartment and then subsequently had a fall. She ended up in the hospital she did fracture her left arm and subsequently has also been dealing with a lot of dizziness she has been at [employer]. She tells me that getting in the change the dressing has been somewhat difficult as far as  daily dressing changes are concerned with the Santyl. Nonetheless when they do change it that is what is been used and her daughter-in-law has also  been coming in to help out as well with the dressing changes to try to make sure it gets done daily. Fortunately there is no signs of active infection at this time. No fever chills noted. I do believe the Santyl has been beneficial and is likely still the best way to go at this point for the patient. I did review the patient's MRI which showed some potential for reactive osteitis but did not show any signs of osteomyelitis overtly. That cannot be excluded but again right now it does not appear that is very likely present as well. This is good news. I also did review her culture she has been on the Bactrim she tells me she has taken this and again that was appropriate based on the results of her culture. 11/19/2019 upon evaluation today patient appears to be doing decently well in regard to her wound. Fortunately there is no signs of active infection at this time wound is not significantly larger in fact is roughly about the same at this point. With that being said there is not as much erythema as previously noted I think the infection seems to be under much better control which is great news. Unfortunately she has fallen since I last saw her and broken her left arm she has an appointment with them following the appointment with me. 8/25; patient was worked in acutely today at the request of home health and was concerned about her right heel, generalized erythema in her feet bilaterally. According to her daughter things have been getting worse over the last for 5 days but not precisely from a wound care point of view. Her wound is on the lateral part of the right fifth metatarsal head looks about the same as I remember this. She has developed dryness and flaking of the skin on her plantar feet. As well a very tender area localized over the insertion point of the Achilles tendon. She has restless leg syndrome. Does not specifically offload the area at night. She wears socks at home and does not really  give me a pressure on the right heel issue. I reviewed her vascular status. She underwent her last revascularization on 08/02/2019 by Dr. Lucky Cowboy. This included an angioplasty of the right peroneal artery as well as the right posterior tibial artery. Her last noninvasive arterial study was on 11/23/2019. On the right her ABI was 1.08 TBI of 0.48. On the left ABI at 1.21 with a TBI of 0.54. Family relates that Dr. Lucky Cowboy felt that he had done the best he could and this. We have been using Santyl to the one wound she has on the right lateral fifth metatarsal head. She does not have a new wound today which is the concern that letter coming into the clinic 12/09/2019 on evaluation today patient appears to be doing decently well in regard to her foot ulcer at this point. She does note that with the compression wrap that the home health nurse put on after contacting our clinic that she actually did much better and felt much better. Her heel is also improved. Fortunately there is no signs of active infection at this time. No fevers, chills, nausea, vomiting, or diarrhea. 12/24/2019 upon evaluation today patient appears to be doing somewhat poorly still in regard to her foot. Unfortunately this is just not showing the signs of improvement  that I would like to see it is a little bit cleaner but also appears to be erythematous and I believe there is infection noted at this point. We will get a need to address this in my opinion. I did actually recommend that we switch back to the Iodoflex as well apparently the home health nurse had switch this away that not really what we want to do at this point in my opinion. They were using silver alginate I think most recently. Electronic Signature(s) Signed: 12/24/2019 5:50:46 PM By: Worthy Keeler PA-C Entered By: Worthy Keeler on 12/24/2019 17:50:46 CHELBY, SALATA ([id number]) -------------------------------------------------------------------------------- Physical Exam  Details Patient Name: [name], [name]. Date of Service: 12/24/2019 12:30 PM Medical Record Number: [id number] Patient Account Number: [id number] Date of Birth/Sex: 1929/04/19 (86 y.o. F) Treating RN: Grover Canavan Primary Care Provider: Ria Bush Other Clinician: Referring Provider: Ria Bush Treating Provider/Extender: STONE III, Harlee Eckroth Weeks in Treatment: 75 Constitutional Well-nourished and well-hydrated in no acute distress. Respiratory normal breathing without difficulty. Psychiatric this patient is able to make decisions and demonstrates good insight into disease process. Alert and Oriented x 3. pleasant and cooperative. Notes Upon inspection patient's wound bed actually had some signs of granulation which is an improvement compared to previous. But still were not quite at the point that I would like to be as far as overall significant improvement is concerned. I do believe he can need to initiate treatment with an antibiotic as well. Electronic Signature(s) Signed: 12/24/2019 5:51:53 PM By: Worthy Keeler PA-C Entered By: Worthy Keeler on 12/24/2019 17:51:52 [name], [name] ([id number]) -------------------------------------------------------------------------------- Physician Orders Details Patient Name: [name] Date of Service: 12/24/2019 12:30 PM Medical Record Number: [id number] Patient Account Number: [id number] Date of Birth/Sex: [birth year] (86 y.o. F) Treating RN: Grover Canavan Primary Care Provider: Ria Bush Other Clinician: Referring Provider: Ria Bush Treating Provider/Extender: Melburn Hake, Dolly Harbach Weeks in Treatment: 10 Verbal / Phone Orders: No Diagnosis Coding ICD-10 Coding Code Description I73.89 Other specified peripheral vascular diseases L97.512 Non-pressure chronic ulcer of other part of right foot with fat layer exposed Z79.01 Long term (current) use of anticoagulants I48.0 Paroxysmal atrial  fibrillation B35.3 Tinea pedis Wound Cleansing Wound #3 Right Metatarsal head fifth o Clean wound with Normal Saline. Primary Wound Dressing Wound #3 Right Metatarsal head fifth o Iodoflex - Spray with 20% benzocaine, Americaine brand name, prior to application of iodoflex Secondary Dressing Wound #3 Right Metatarsal head fifth o ABD pad o Other - heel cup to right heel Dressing Change Frequency Wound #3 Right Metatarsal head fifth o Other: - Tuesday and Friday. HH to change Tues and Friday weeks patient does not come to the [facility] and on opposite weeks [place] to changed on Tuesday and [place] changes on Friday. Follow-up Appointments Wound #3 Right Metatarsal head fifth o Return Appointment in 2 weeks. Edema Control Wound #3 Right Metatarsal head fifth o 3 Layer Compression System - Right Lower Extremity o Elevate legs to the level of the heart and pump ankles as often as possible Off-Loading Wound #3 Right Metatarsal head fifth o Other: - Keep pressure off of the wound, heel cup on right heel Additional Orders / Instructions Wound #3 Right Metatarsal head fifth o Increase protein intake. [place] Visits o Home Health Nurse may visit PRN to address patientos wound care needs. o FACE TO FACE ENCOUNTER: MEDICARE and MEDICAID PATIENTS: I certify that this patient is under my care and that I had  a face-to-face encounter that meets the physician face-to-face encounter requirements with this patient on this date. The ASIANNA, BRUNDAGE ([id number]) encounter with the patient was in whole or in part for the following MEDICAL CONDITION: (primary reason for [place]) MEDICAL NECESSITY: I certify, that based on my findings, NURSING services are a medically necessary home health service. HOME BOUND STATUS: I certify that my clinical findings support that this patient is homebound (i.e., Due to illness or injury, pt requires aid of supportive  devices such as crutches, cane, wheelchairs, walkers, the use of special transportation or the assistance of another person to leave their place of residence. There is a normal inability to leave the home and doing so requires considerable and taxing effort. Other absences are for medical reasons / religious services and are infrequent or of short duration when for other reasons). o If current dressing causes regression in wound condition, may D/C ordered dressing product/s and apply Normal Saline Moist Dressing daily until next [place] / Other MD appointment. [place] of regression in wound condition at [phone]. o Please direct any NON-WOUND related issues/requests for orders to patient's Primary Care Physician Laboratory o Bacteria identified in Wound by Culture (MICRO) oooo LOINC Code: [id number] oooo Convenience Name: Wound culture routine Patient Medications Allergies: penicillin Notifications Medication Indication Start End Bactrim DS 12/24/2019 DOSE 1 - oral 800 mg-160 mg tablet - 1 tablet oral taken 2 times per day for 14 days. Do not take potassium while on this medication Notes start Bactrim Electronic Signature(s) Signed: 12/24/2019 1:57:32 PM By: Worthy Keeler PA-C Entered By: Worthy Keeler on 12/24/2019 13:57:31 [name], [name] ([id number]) -------------------------------------------------------------------------------- Problem List Details Patient Name: [name] Date of Service: 12/24/2019 12:30 PM Medical Record Number: [id number] Patient Account Number: [id number] Date of Birth/Sex: [birth year] (86 y.o. F) Treating RN: Grover Canavan Primary Care Provider: Ria Bush Other Clinician: Referring Provider: Ria Bush Treating Provider/Extender: Melburn Hake, Malichi Palardy Weeks in Treatment: 10 Active Problems ICD-10 Encounter Code Description Active Date MDM Diagnosis I73.89 Other specified peripheral vascular  diseases 10/12/2019 No Yes L97.512 Non-pressure chronic ulcer of other part of right foot with fat layer 10/12/2019 No Yes exposed Z79.01 Long term (current) use of anticoagulants 10/12/2019 No Yes I48.0 Paroxysmal atrial fibrillation 10/12/2019 No Yes B35.3 Tinea pedis 12/01/2019 No Yes Inactive Problems Resolved Problems Electronic Signature(s) Signed: 12/24/2019 1:06:06 PM By: Worthy Keeler PA-C Entered By: Worthy Keeler on 12/24/2019 13:06:05 [name] ([id number]) -------------------------------------------------------------------------------- Progress Note Details Patient Name: [name]. Date of Service: 12/24/2019 12:30 PM Medical Record Number: [id number] Patient Account Number: [id number] Date of Birth/Sex: 06-06-1929 (86 y.o. F) Treating RN: Grover Canavan Primary Care Provider: Ria Bush Other Clinician: Referring Provider: Ria Bush Treating Provider/Extender: Melburn Hake, Henritta Mutz Weeks in Treatment: 10 Subjective Chief Complaint Information obtained from Patient Right Lateral Foot Ulcer History of Present Illness (HPI) 10/12/2019 upon evaluation today patient appears to be doing somewhat poorly in regard to her wound at this point. She has a ulcer over the right fifth metatarsal region. This has been managed by podiatry up to this point. Unfortunately the patient just does not seem to be making the progress that they were hoping for an subsequently the patient was referred to [place] by [facility] Dr. Posey Pronto. With that being said I did note that the patient does have peripheral vascular disease which has been managed by Dr. Lazaro Arms her most recent angiogram with intervention was on  08/02/2019. The ABIs following were on the right 1.30 with a TBI of 0.61 normal left 1.31 with a TBI of 0.58. Obviously things seem to be doing somewhat better at this point hopefully enough to allow her to heal. She is on anticoagulant therapy due to atrial fibrillation  long-term. With that being said the patient is unfortunately having quite a bit of discomfort at this time secondary to the wound. She also has erythema which is very likely secondary to infection. I am going obtain a culture of the area today once debridement is complete. Apparently the patient also had a x-ray that was performed by Dr. Posey Pronto on 09/21/2019 and this showed that the patient had no obvious signs on x-ray of cortical irregularity and no osteomyelitis obviously noted. With that being said as I discussed with the patient's daughter as well this does not indicate that the patient absolutely has no osteomyelitis as x-rays are only at best 50% helpful in identifying this complication. Nonetheless I do believe that we need to have the patient continue to have this further evaluated specifically I am to recommend that we check into an MRI which I think would be ideal to evaluate whether or not there is osteomyelitis present. 7/16; this is a patient with an ulcer over the right fifth metatarsal head. She follows with Dr. [facility] Cowboy [facility]. She is due to have an MRI of the foot on 7/26. Using Santyl to the wound 11/04/2019 on evaluation today patient appears to be doing okay in regard to her wound at this time. Unfortunately since I last saw her which was actually her second visit here in the clinic she has moved into a new apartment and then subsequently had a fall. She ended up in the hospital she did fracture her left arm and subsequently has also been dealing with a lot of dizziness she has been at [employer]. She tells me that getting in the change the dressing has been somewhat difficult as far as daily dressing changes are concerned with the Santyl. Nonetheless when they do change it that is what is been used and her daughter-in-law has also been coming in to help out as well with the dressing changes to try to make sure it gets done daily. Fortunately there is no signs of  active infection at this time. No fever chills noted. I do believe the Santyl has been beneficial and is likely still the best way to go at this point for the patient. I did review the patient's MRI which showed some potential for reactive osteitis but did not show any signs of osteomyelitis overtly. That cannot be excluded but again right now it does not appear that is very likely present as well. This is good news. I also did review her culture she has been on the Bactrim she tells me she has taken this and again that was appropriate based on the results of her culture. 11/19/2019 upon evaluation today patient appears to be doing decently well in regard to her wound. Fortunately there is no signs of active infection at this time wound is not significantly larger in fact is roughly about the same at this point. With that being said there is not as much erythema as previously noted I think the infection seems to be under much better control which is great news. Unfortunately she has fallen since I last saw her and broken her left arm she has an appointment with them following the appointment with me.  8/25; patient was worked in acutely today at the request of home health and was concerned about her right heel, generalized erythema in her feet bilaterally. According to her daughter things have been getting worse over the last for 5 days but not precisely from a wound care point of view. Her wound is on the lateral part of the right fifth metatarsal head looks about the same as I remember this. She has developed dryness and flaking of the skin on her plantar feet. As well a very tender area localized over the insertion point of the Achilles tendon. She has restless leg syndrome. Does not specifically offload the area at night. She wears socks at home and does not really give me a pressure on the right heel issue. I reviewed her vascular status. She underwent her last revascularization on 08/02/2019 by Dr.  Lucky Cowboy. This included an angioplasty of the right peroneal artery as well as the right posterior tibial artery. Her last noninvasive arterial study was on 11/23/2019. On the right her ABI was 1.08 TBI of 0.48. On the left ABI at 1.21 with a TBI of 0.54. Family relates that Dr. Lucky Cowboy felt that he had done the best he could and this. We have been using Santyl to the one wound she has on the right lateral fifth metatarsal head. She does not have a new wound today which is the concern that letter coming into the clinic 12/09/2019 on evaluation today patient appears to be doing decently well in regard to her foot ulcer at this point. She does note that with the compression wrap that the home health nurse put on after contacting our clinic that she actually did much better and felt much better. Her heel is also improved. Fortunately there is no signs of active infection at this time. No fevers, chills, nausea, vomiting, or diarrhea. 12/24/2019 upon evaluation today patient appears to be doing somewhat poorly still in regard to her foot. Unfortunately this is just not showing the signs of improvement that I would like to see it is a little bit cleaner but also appears to be erythematous and I believe there is infection noted at this point. We will get a need to address this in my opinion. I did actually recommend that we switch back to the Iodoflex as well apparently the home health nurse had switch this away that not really what we want to do at this point in my opinion. They were using silver alginate I think most recently. [name], [name] ([id number]) Objective Constitutional Well-nourished and well-hydrated in no acute distress. Vitals Time Taken: 12:50 PM, Height: 62 in, Weight: 140 lbs, BMI: 25.6, Temperature: 98.4 F, Pulse: 70 bpm, Respiratory Rate: 18 breaths/min, Blood Pressure: 170/62 mmHg. Respiratory normal breathing without difficulty. Psychiatric this patient is able to make decisions and  demonstrates good insight into disease process. Alert and Oriented x 3. pleasant and cooperative. General Notes: Upon inspection patient's wound bed actually had some signs of granulation which is an improvement compared to previous. But still were not quite at the point that I would like to be as far as overall significant improvement is concerned. I do believe he can need to initiate treatment with an antibiotic as well. Integumentary (Hair, Skin) Wound #3 status is Open. Original cause of wound was Gradually Appeared. The wound is located on the Right Metatarsal head fifth. The wound measures 1cm length x 1.2cm width x 0.4cm depth; 0.942cm^2 area and 0.377cm^3 volume. There is Fat Layer (Subcutaneous Tissue)  exposed. There is a medium amount of serosanguineous drainage noted. The wound margin is distinct with the outline attached to the wound base. There is small (1-33%) red granulation within the wound bed. There is a large (67-100%) amount of necrotic tissue within the wound bed including Adherent Slough. Assessment Active Problems ICD-10 Other specified peripheral vascular diseases Non-pressure chronic ulcer of other part of right foot with fat layer exposed Long term (current) use of anticoagulants Paroxysmal atrial fibrillation Tinea pedis Procedures Wound #3 Pre-procedure diagnosis of Wound #3 is an Arterial Insufficiency Ulcer located on the Right Metatarsal head fifth . There was a Three Layer Compression Therapy Procedure by Grover Canavan, RN. Post procedure Diagnosis Wound #3: Same as Pre-Procedure Plan Wound Cleansing: Wound #3 Right Metatarsal head fifth: Clean wound with Normal Saline. Primary Wound Dressing: Wound #3 Right Metatarsal head fifth: Iodoflex - Spray with 20% benzocaine, [place]aine brand name, prior to application of iodoflex Secondary Dressing: Wound #3 Right Metatarsal head fifth: [name], [name]. ([id number]) ABD pad Other - heel cup to right  heel Dressing Change Frequency: Wound #3 Right Metatarsal head fifth: Other: - Tuesday and Friday. HH to change Tues and Friday weeks patient does not come to the [facility] and on opposite weeks [place] to changed on Tuesday and [place] changes on Friday. Follow-up Appointments: Wound #3 Right Metatarsal head fifth: Return Appointment in 2 weeks. Edema Control: Wound #3 Right Metatarsal head fifth: 3 Layer Compression System - Right Lower Extremity Elevate legs to the level of the heart and pump ankles as often as possible Off-Loading: Wound #3 Right Metatarsal head fifth: Other: - Keep pressure off of the wound, heel cup on right heel Additional Orders / Instructions: Wound #3 Right Metatarsal head fifth: Increase protein intake. Home Health: [place] Nurse may visit PRN to address patient s wound care needs. FACE TO FACE ENCOUNTER: MEDICARE and MEDICAID PATIENTS: I certify that this patient is under my care and that I had a face-to-face encounter that meets the physician face-to-face encounter requirements with this patient on this date. The encounter with the patient was in whole or in part for the following MEDICAL CONDITION: (primary reason for [place]) MEDICAL NECESSITY: I certify, that based on my findings, NURSING services are a medically necessary home health service. HOME BOUND STATUS: I certify that my clinical findings support that this patient is homebound (i.e., Due to illness or injury, pt requires aid of supportive devices such as crutches, cane, wheelchairs, walkers, the use of special transportation or the assistance of another person to leave their place of residence. There is a normal inability to leave the home and doing so requires considerable and taxing effort. Other absences are for medical reasons / religious services and are infrequent or of short duration when for other reasons). If current dressing causes regression in wound condition,  may D/C ordered dressing product/s and apply Normal Saline Moist Dressing daily until next [place] / Other MD appointment. [place] of regression in wound condition at [phone]. Please direct any NON-WOUND related issues/requests for orders to patient's Primary Care Physician Laboratory ordered were: Wound culture routine The following medication(s) was prescribed: Bactrim DS oral 800 mg-160 mg tablet 1 1 tablet oral taken 2 times per day for 14 days. Do not take potassium while on this medication starting 12/24/2019 General Notes: start Bactrim 1. I would recommend at this time that we go ahead and initiate treatment with Bactrim DS I believe  that is can be a good option and the patient should not take the potassium supplement with this I think for short-term she will be okay as long as she avoids that apparently she is not taking the potassium right now anyway. 2. I am also can recommend that we continue with the Iodoflex as the dressing of choice I think that is best will use XtraSorb over top. 3. I am also can recommend that the patient continue with a 3 layer compression wrap that seems to be helpful for her as well to be honest. We will see patient back for reevaluation in 2 weeks here in the clinic. If anything worsens or changes patient will contact our office for additional recommendations. Electronic Signature(s) Signed: 12/24/2019 5:52:33 PM By: Worthy Keeler PA-C Entered By: Worthy Keeler on 12/24/2019 17:52:33 [name], [name] ([id number]) -------------------------------------------------------------------------------- SuperBill Details Patient Name: [name] Date of Service: 12/24/2019 Medical Record Number: [id number] Patient Account Number: [id number] Date of Birth/Sex: [birth year] (86 y.o. F) Treating RN: Grover Canavan Primary Care Provider: Ria Bush Other Clinician: Referring Provider: Ria Bush Treating  Provider/Extender: Melburn Hake, Maor Meckel Weeks in Treatment: 10 Diagnosis Coding ICD-10 Codes Code Description I73.89 Other specified peripheral vascular diseases L97.512 Non-pressure chronic ulcer of other part of right foot with fat layer exposed Z79.01 Long term (current) use of anticoagulants I48.0 Paroxysmal atrial fibrillation B35.3 Tinea pedis Facility Procedures CPT4 Code: 50932671 Description: (Facility Use Only) [id number] - APPLY [place] LWR RT LEG Modifier: Quantity: 1 Physician Procedures CPT4 Code: 8338250 Description: 53976 - WC PHYS LEVEL 4 - EST PT Modifier: Quantity: 1 CPT4 Code: Description: ICD-10 Diagnosis Description I73.89 Other specified peripheral vascular diseases L97.512 Non-pressure chronic ulcer of other part of right foot with fat layer e Z79.01 Long term (current) use of anticoagulants I48.0 Paroxysmal atrial  fibrillation Modifier: xposed Quantity: Electronic Signature(s) Signed: 12/24/2019 5:52:49 PM By: Worthy Keeler PA-C Previous Signature: 12/24/2019 4:35:13 PM Version By: Grover Canavan Entered By: Worthy Keeler on 12/24/2019 17:52:49

## 2019-12-27 ENCOUNTER — Other Ambulatory Visit: Payer: Self-pay

## 2019-12-27 ENCOUNTER — Encounter: Payer: Self-pay | Admitting: Family Medicine

## 2019-12-27 ENCOUNTER — Ambulatory Visit (INDEPENDENT_AMBULATORY_CARE_PROVIDER_SITE_OTHER): Payer: Medicare Other | Admitting: Family Medicine

## 2019-12-27 VITALS — BP 154/72 | HR 82 | Temp 97.5°F | Ht 62.0 in | Wt 136.1 lb

## 2019-12-27 DIAGNOSIS — K219 Gastro-esophageal reflux disease without esophagitis: Secondary | ICD-10-CM | POA: Diagnosis not present

## 2019-12-27 DIAGNOSIS — S52602D Unspecified fracture of lower end of left ulna, subsequent encounter for closed fracture with routine healing: Secondary | ICD-10-CM

## 2019-12-27 DIAGNOSIS — E538 Deficiency of other specified B group vitamins: Secondary | ICD-10-CM

## 2019-12-27 DIAGNOSIS — F331 Major depressive disorder, recurrent, moderate: Secondary | ICD-10-CM

## 2019-12-27 DIAGNOSIS — L97519 Non-pressure chronic ulcer of other part of right foot with unspecified severity: Secondary | ICD-10-CM | POA: Diagnosis not present

## 2019-12-27 DIAGNOSIS — I272 Pulmonary hypertension, unspecified: Secondary | ICD-10-CM | POA: Diagnosis not present

## 2019-12-27 DIAGNOSIS — M51369 Other intervertebral disc degeneration, lumbar region without mention of lumbar back pain or lower extremity pain: Secondary | ICD-10-CM

## 2019-12-27 DIAGNOSIS — S52502D Unspecified fracture of the lower end of left radius, subsequent encounter for closed fracture with routine healing: Secondary | ICD-10-CM

## 2019-12-27 DIAGNOSIS — I130 Hypertensive heart and chronic kidney disease with heart failure and stage 1 through stage 4 chronic kidney disease, or unspecified chronic kidney disease: Secondary | ICD-10-CM | POA: Diagnosis not present

## 2019-12-27 DIAGNOSIS — M810 Age-related osteoporosis without current pathological fracture: Secondary | ICD-10-CM

## 2019-12-27 DIAGNOSIS — I872 Venous insufficiency (chronic) (peripheral): Secondary | ICD-10-CM | POA: Diagnosis not present

## 2019-12-27 DIAGNOSIS — M79605 Pain in left leg: Secondary | ICD-10-CM

## 2019-12-27 DIAGNOSIS — N1831 Chronic kidney disease, stage 3a: Secondary | ICD-10-CM

## 2019-12-27 DIAGNOSIS — G9009 Other idiopathic peripheral autonomic neuropathy: Secondary | ICD-10-CM | POA: Diagnosis not present

## 2019-12-27 DIAGNOSIS — I2510 Atherosclerotic heart disease of native coronary artery without angina pectoris: Secondary | ICD-10-CM | POA: Diagnosis not present

## 2019-12-27 DIAGNOSIS — E7849 Other hyperlipidemia: Secondary | ICD-10-CM | POA: Diagnosis not present

## 2019-12-27 DIAGNOSIS — G2581 Restless legs syndrome: Secondary | ICD-10-CM | POA: Diagnosis not present

## 2019-12-27 DIAGNOSIS — Z9181 History of falling: Secondary | ICD-10-CM | POA: Diagnosis not present

## 2019-12-27 DIAGNOSIS — I4819 Other persistent atrial fibrillation: Secondary | ICD-10-CM

## 2019-12-27 DIAGNOSIS — M5136 Other intervertebral disc degeneration, lumbar region: Secondary | ICD-10-CM

## 2019-12-27 DIAGNOSIS — M79671 Pain in right foot: Secondary | ICD-10-CM

## 2019-12-27 DIAGNOSIS — Z7901 Long term (current) use of anticoagulants: Secondary | ICD-10-CM | POA: Diagnosis not present

## 2019-12-27 DIAGNOSIS — Z7952 Long term (current) use of systemic steroids: Secondary | ICD-10-CM | POA: Diagnosis not present

## 2019-12-27 DIAGNOSIS — L97513 Non-pressure chronic ulcer of other part of right foot with necrosis of muscle: Secondary | ICD-10-CM | POA: Diagnosis not present

## 2019-12-27 DIAGNOSIS — I739 Peripheral vascular disease, unspecified: Secondary | ICD-10-CM

## 2019-12-27 DIAGNOSIS — I5032 Chronic diastolic (congestive) heart failure: Secondary | ICD-10-CM | POA: Diagnosis not present

## 2019-12-27 DIAGNOSIS — M79604 Pain in right leg: Secondary | ICD-10-CM

## 2019-12-27 DIAGNOSIS — I10 Essential (primary) hypertension: Secondary | ICD-10-CM

## 2019-12-27 DIAGNOSIS — S62102D Fracture of unspecified carpal bone, left wrist, subsequent encounter for fracture with routine healing: Secondary | ICD-10-CM | POA: Diagnosis not present

## 2019-12-27 DIAGNOSIS — R413 Other amnesia: Secondary | ICD-10-CM

## 2019-12-27 DIAGNOSIS — E559 Vitamin D deficiency, unspecified: Secondary | ICD-10-CM | POA: Diagnosis not present

## 2019-12-27 DIAGNOSIS — M79672 Pain in left foot: Secondary | ICD-10-CM

## 2019-12-27 DIAGNOSIS — G6289 Other specified polyneuropathies: Secondary | ICD-10-CM

## 2019-12-27 DIAGNOSIS — Z7982 Long term (current) use of aspirin: Secondary | ICD-10-CM | POA: Diagnosis not present

## 2019-12-27 DIAGNOSIS — G8929 Other chronic pain: Secondary | ICD-10-CM

## 2019-12-27 DIAGNOSIS — M1990 Unspecified osteoarthritis, unspecified site: Secondary | ICD-10-CM | POA: Diagnosis not present

## 2019-12-27 DIAGNOSIS — I4891 Unspecified atrial fibrillation: Secondary | ICD-10-CM | POA: Diagnosis not present

## 2019-12-27 LAB — RENAL FUNCTION PANEL
Albumin: 3.9 g/dL (ref 3.5–5.2)
BUN: 15 mg/dL (ref 6–23)
CO2: 28 mEq/L (ref 19–32)
Calcium: 9.2 mg/dL (ref 8.4–10.5)
Chloride: 101 mEq/L (ref 96–112)
Creatinine, Ser: 1.07 mg/dL (ref 0.40–1.20)
GFR: 48.33 mL/min — ABNORMAL LOW (ref >60.00)
Glucose, Bld: 89 mg/dL (ref 70–99)
Phosphorus: 3.6 mg/dL (ref 2.3–4.6)
Potassium: 4 mEq/L (ref 3.5–5.1)
Sodium: 139 mEq/L (ref 135–145)

## 2019-12-27 MED ORDER — DENOSUMAB 60 MG/ML ~~LOC~~ SOSY
60.0000 mg | PREFILLED_SYRINGE | Freq: Once | SUBCUTANEOUS | Status: AC
  Administered 2019-12-27: 60 mg via SUBCUTANEOUS

## 2019-12-27 MED ORDER — CYANOCOBALAMIN 1000 MCG/ML IJ SOLN
1000.0000 ug | Freq: Once | INTRAMUSCULAR | Status: AC
  Administered 2019-12-27: 1000 ug via INTRAMUSCULAR

## 2019-12-27 NOTE — Progress Notes (Addendum)
This visit was conducted in person.  BP (!) 154/72 (BP Location: Left Arm, Patient Position: Sitting, Cuff Size: Normal)   Pulse 82   Temp (!) 97.5 F (36.4 C) (Temporal)   Ht 5\' 2"  (1.575 m)   Wt 136 lb 2 oz (61.7 kg)   SpO2 94%   BMI 24.90 kg/m   BP Readings from Last 3 Encounters:  12/27/19 (!) 154/72  11/23/19 128/80  11/16/19 127/68    CC: 6 wk f/u visit  Subjective:    Patient ID: [name], female    DOB: 1929-04-26, 86 y.o.   MRN: [id number]  HPI: [name] is a 86 y.o. female presenting on 12/27/2019 for Follow-up (Here for 6 wk f/u.  Pt accompanied by daughter, [name]- temp 97.9.  Per daughter, pt given a dose of her [daughter's] old low dose rx to help with foot pain. )   Recent hospitalization after fall with L wrist fracture, with ARF that improved prior to discharge. Followed by ortho - now using wrist brace PRN.   Daughter has several concerns: Ongoing poorly healing lateral R foot wound. Has recently restarted oral bactrim antibiotic for possible infection. Upcoming wound clinic appt next Friday, [facility] wound care follows 2 times a week.  Concern over progressive cognitive decline including difficulty with med administration.  Forgetful of conversations, repeats questions.  Constant foot pain. Very limited walking - uses walker. Today in wheelchair.   Lives in [place].  [facility] currently involved, to end soon.  May look into [facility] care services.  Goal is to safely stay at home.  She needs some extra help especially in the mornings.   B12 shot today.  Lasix, potassium and lisinopril still on hold. Continues low dose metoprolol.      Relevant past medical, surgical, family and social history reviewed and updated as indicated. Interim medical history since our last visit reviewed. Allergies and medications reviewed and updated. Outpatient Medications Prior to Visit  Medication Sig Dispense Refill  . acetaminophen  (TYLENOL) 500 MG tablet Take 1,000 mg every 8 (eight) hours as needed by mouth for moderate pain.     Marland Kitchen acyclovir (ZOVIRAX) 400 MG tablet Take 400 mg 2 (two) times daily by mouth.     Marland Kitchen amiodarone (PACERONE) 100 MG tablet Take 1 tablet (100 mg total) by mouth daily.    Marland Kitchen aspirin EC 81 MG tablet Take 1 tablet (81 mg total) by mouth daily. 150 tablet 2  . atorvastatin (LIPITOR) 20 MG tablet TAKE 1 TABLET BY MOUTH ONCE DAILY (Patient taking differently: Take 20 mg by mouth daily. ) 90 tablet 1  . collagenase (SANTYL) ointment Apply 1 application topically daily. 15 g 0  . DULoxetine (CYMBALTA) 60 MG capsule TAKE 1 CAPSULE BY MOUTH ONCE DAILY (Patient taking differently: Take 60 mg by mouth daily. ) 90 capsule 1  . ELIQUIS 2.5 MG TABS tablet TAKE 1 TABLET BY MOUTH TWICE DAILY (Patient taking differently: Take 2.5 mg by mouth 2 (two) times daily. ) 180 tablet 1  . ergocalciferol (VITAMIN D2) 1.25 MG (50000 UT) capsule Take 1 capsule (50,000 Units total) by mouth once a week. (Patient taking differently: Take 50,000 Units by mouth once a week. Saturday) 12 capsule 3  . gabapentin (NEURONTIN) 300 MG capsule Take 1 capsule (300 mg total) by mouth at bedtime.    . metoprolol tartrate (LOPRESSOR) 25 MG tablet Take 0.5 tablets (12.5 mg total) by mouth 2 (two) times  daily.    . pantoprazole (PROTONIX) 40 MG tablet TAKE 1 TABLET BY MOUTH EVERY DAY (Patient taking differently: Take 40 mg by mouth daily. ) 90 tablet 1  . polyethylene glycol (MIRALAX / GLYCOLAX) 17 g packet Take 17 g by mouth daily as needed for mild constipation. 14 each 0  . prednisoLONE acetate (PRED FORTE) 1 % ophthalmic suspension Place 1 drop into the left eye daily.     Marland Kitchen rOPINIRole (REQUIP) 1 MG tablet Take 2.5 tablets (2.5 mg total) by mouth at bedtime.    . traMADol (ULTRAM) 50 MG tablet Take 50 mg by mouth daily. Takes 2 tablets daily  0  . furosemide (LASIX) 20 MG tablet Take 1 tablet (20 mg total) by mouth daily as needed for fluid or  edema. 30 tablet 3  . potassium chloride SA (KLOR-CON) 10 MEQ tablet Take 1 tablet (10 mEq total) by mouth daily as needed (lasix use). 30 tablet 3  . tamsulosin (FLOMAX) 0.4 MG CAPS capsule Take 1 capsule (0.4 mg total) by mouth daily after breakfast. (Patient not taking: Reported on 11/16/2019) 30 capsule   . tizanidine (ZANAFLEX) 2 MG capsule Take 2 mg by mouth 3 (three) times daily.     No facility-administered medications prior to visit.     Per HPI unless specifically indicated in ROS section below Review of Systems Objective:  BP (!) 154/72 (BP Location: Left Arm, Patient Position: Sitting, Cuff Size: Normal)   Pulse 82   Temp (!) 97.5 F (36.4 C) (Temporal)   Ht 5\' 2"  (1.575 m)   Wt 136 lb 2 oz (61.7 kg)   SpO2 94%   BMI 24.90 kg/m   Wt Readings from Last 3 Encounters:  12/27/19 136 lb 2 oz (61.7 kg)  11/23/19 138 lb (62.6 kg)  11/16/19 136 lb (61.7 kg)      Physical Exam Vitals and nursing note reviewed.  Constitutional:      Appearance: Normal appearance. She is not ill-appearing.     Comments:  In wheelchair Has life alert system in place.   Cardiovascular:     Rate and Rhythm: Normal rate and regular rhythm.     Pulses: Normal pulses.     Heart sounds: Murmur (3/6 systolic murmur best at LSB) heard.   Pulmonary:     Effort: Pulmonary effort is normal. No respiratory distress.     Breath sounds: Normal breath sounds. No wheezing, rhonchi or rales.  Musculoskeletal:        General: Swelling present.     Right lower leg: Edema (compression wrap in place) present.     Left lower leg: No edema.  Skin:    General: Skin is warm and dry.     Findings: No rash.  Neurological:     Mental Status: She is alert.  Psychiatric:        Mood and Affect: Mood normal.        Behavior: Behavior normal.       Results for orders placed or performed in visit on 12/27/19  Renal function panel  Result Value Ref Range   Sodium 139 135 - 145 mEq/L   Potassium 4.0 3.5 - 5.1  mEq/L   Chloride 101 96 - 112 mEq/L   CO2 28 19 - 32 mEq/L   Albumin 3.9 3.5 - 5.2 g/dL   BUN 15 6 - 23 mg/dL   Creatinine, Ser 1.07 0.40 - 1.20 mg/dL   Glucose, Bld 89 70 - 99 mg/dL  Phosphorus 3.6 2.3 - 4.6 mg/dL   GFR 48.33 (L) >60.00 mL/min   Calcium 9.2 8.4 - 10.5 mg/dL   Lab Results  Component Value Date   VITAMINB12 277 11/16/2019    Assessment & Plan:  This visit occurred during the SARS-CoV-2 public health emergency.  Safety protocols were in place, including screening questions prior to the visit, additional usage of staff PPE, and extensive cleaning of exam room while observing appropriate contact time as indicated for disinfecting solutions.   Problem List Items Addressed This Visit    Vitamin B12 deficiency    Rpt B12 shot today.       Ulcer of right foot ([place]) - Primary    Continues seeing [place] wound RN and wound clinic for poorly healing R foot wound.  Latest treating possible infection with bactrim course - will update Cr with recent ARF while on bactrim.  S/p PAD interventions ([facility])       Relevant Orders   Amb Referral to Palliative Care   Persistent atrial fibrillation (HCC)    Continues aspirin, eliquis, amiodarone and low dose metoprolol Sounds regular today      Peripheral neuropathy    Continue gabapentin 300mg  nightly, tramadol.       Relevant Orders   Amb Referral to Palliative Care   PAD (peripheral artery disease) ([place])    Appreciate [facility] care.       Osteoporosis    Prolia injection today.  Needs regularly Q3mo      Memory deficit    Concern for progressive memory trouble - I have asked pt/daughter to return in 3 wks for formal geriatric assessment.       Relevant Orders   Amb Referral to Palliative Care   MDD (major depressive disorder), recurrent episode ([place])    Continues cymbalta 60mg  daily.  Will need to further evaluate in setting of possible progressive memory trouble.       Relevant Orders   Amb Referral to Palliative Care    Essential hypertension    BP mildly elevated - we stopped antihypertensives after recent hospitalization with ARF - now only on metoprolol 12.5mg  bid. Consider restarting ACEI vs increasing metoprolol dosing.  She is currently off lasix/potassium.       DDD (degenerative disc disease), lumbar   Closed fracture of left distal radius and ulna    Followed by ortho planned conservative nonoperative management. Continues healing well.      Relevant Orders   Amb Referral to Palliative Care   CKD (chronic kidney disease) stage 3, GFR 30-59 ml/min    Update labs.       Relevant Orders   Renal function panel (Completed)   Chronic leg pain    Persistent R>L LE pain, multifactorial. Large portion due to poorly healing R foot ulcer affecting ability to walk - Rx for transport chair provided.  Increasing struggles noted at home by patient and daughter. No significant improvement in conditioning status between HHPT sessions. Discussed palliative care home evaluation as a good place to start - will refer. They will also look into personal care services at home.       Relevant Orders   Amb Referral to Palliative Care   Bilateral leg and foot pain   Relevant Orders   Amb Referral to Palliative Care       Meds ordered this encounter  Medications  . cyanocobalamin ((VITAMIN B-12)) injection 1,000 mcg  . denosumab (PROLIA) injection 60 mg   Orders Placed This Encounter  Procedures  . Renal function panel  . Amb Referral to Palliative Care    Referral Priority:   Routine    Referral Type:   Consultation    Number of Visits Requested:   1    Patient Instructions  Prolia and b12 shots today.  Labs today  May take prescription for transport chair to local durable equipment store.  I will ask palliative care team to come out to the house for evaluation.  Look into personal care services for extra assistance at home.  Return in 3-4 weeks for formal memory testing   Follow up plan: No  follow-ups on file.  Ria Bush, MD

## 2019-12-27 NOTE — Patient Instructions (Addendum)
Prolia and b12 shots today.  Labs today  May take prescription for transport chair to local durable equipment store.  I will ask palliative care team to come out to the house for evaluation.  Look into personal care services for extra assistance at home.  Return in 3-4 weeks for formal memory testing

## 2019-12-28 ENCOUNTER — Ambulatory Visit: Payer: Medicare Other | Admitting: Podiatry

## 2019-12-28 ENCOUNTER — Telehealth: Payer: Self-pay | Admitting: *Deleted

## 2019-12-28 ENCOUNTER — Other Ambulatory Visit: Payer: Self-pay

## 2019-12-28 DIAGNOSIS — L97513 Non-pressure chronic ulcer of other part of right foot with necrosis of muscle: Secondary | ICD-10-CM | POA: Diagnosis not present

## 2019-12-28 DIAGNOSIS — G9009 Other idiopathic peripheral autonomic neuropathy: Secondary | ICD-10-CM | POA: Diagnosis not present

## 2019-12-28 DIAGNOSIS — N1831 Chronic kidney disease, stage 3a: Secondary | ICD-10-CM | POA: Diagnosis not present

## 2019-12-28 DIAGNOSIS — B351 Tinea unguium: Secondary | ICD-10-CM

## 2019-12-28 DIAGNOSIS — Z7952 Long term (current) use of systemic steroids: Secondary | ICD-10-CM | POA: Diagnosis not present

## 2019-12-28 DIAGNOSIS — I2510 Atherosclerotic heart disease of native coronary artery without angina pectoris: Secondary | ICD-10-CM | POA: Diagnosis not present

## 2019-12-28 DIAGNOSIS — E7849 Other hyperlipidemia: Secondary | ICD-10-CM | POA: Diagnosis not present

## 2019-12-28 DIAGNOSIS — Z7901 Long term (current) use of anticoagulants: Secondary | ICD-10-CM | POA: Diagnosis not present

## 2019-12-28 DIAGNOSIS — Z9181 History of falling: Secondary | ICD-10-CM | POA: Diagnosis not present

## 2019-12-28 DIAGNOSIS — I272 Pulmonary hypertension, unspecified: Secondary | ICD-10-CM | POA: Diagnosis not present

## 2019-12-28 DIAGNOSIS — E538 Deficiency of other specified B group vitamins: Secondary | ICD-10-CM | POA: Diagnosis not present

## 2019-12-28 DIAGNOSIS — L539 Erythematous condition, unspecified: Secondary | ICD-10-CM | POA: Diagnosis not present

## 2019-12-28 DIAGNOSIS — Z7982 Long term (current) use of aspirin: Secondary | ICD-10-CM | POA: Diagnosis not present

## 2019-12-28 DIAGNOSIS — M79675 Pain in left toe(s): Secondary | ICD-10-CM | POA: Diagnosis not present

## 2019-12-28 DIAGNOSIS — S62102D Fracture of unspecified carpal bone, left wrist, subsequent encounter for fracture with routine healing: Secondary | ICD-10-CM | POA: Diagnosis not present

## 2019-12-28 DIAGNOSIS — M1990 Unspecified osteoarthritis, unspecified site: Secondary | ICD-10-CM | POA: Diagnosis not present

## 2019-12-28 DIAGNOSIS — M79674 Pain in right toe(s): Secondary | ICD-10-CM | POA: Diagnosis not present

## 2019-12-28 DIAGNOSIS — I739 Peripheral vascular disease, unspecified: Secondary | ICD-10-CM | POA: Diagnosis not present

## 2019-12-28 DIAGNOSIS — I130 Hypertensive heart and chronic kidney disease with heart failure and stage 1 through stage 4 chronic kidney disease, or unspecified chronic kidney disease: Secondary | ICD-10-CM | POA: Diagnosis not present

## 2019-12-28 DIAGNOSIS — I5032 Chronic diastolic (congestive) heart failure: Secondary | ICD-10-CM | POA: Diagnosis not present

## 2019-12-28 DIAGNOSIS — G2581 Restless legs syndrome: Secondary | ICD-10-CM | POA: Diagnosis not present

## 2019-12-28 DIAGNOSIS — G3184 Mild cognitive impairment, so stated: Secondary | ICD-10-CM | POA: Insufficient documentation

## 2019-12-28 DIAGNOSIS — I872 Venous insufficiency (chronic) (peripheral): Secondary | ICD-10-CM | POA: Diagnosis not present

## 2019-12-28 DIAGNOSIS — M810 Age-related osteoporosis without current pathological fracture: Secondary | ICD-10-CM | POA: Diagnosis not present

## 2019-12-28 DIAGNOSIS — K219 Gastro-esophageal reflux disease without esophagitis: Secondary | ICD-10-CM | POA: Diagnosis not present

## 2019-12-28 DIAGNOSIS — M5136 Other intervertebral disc degeneration, lumbar region: Secondary | ICD-10-CM | POA: Diagnosis not present

## 2019-12-28 DIAGNOSIS — I4891 Unspecified atrial fibrillation: Secondary | ICD-10-CM | POA: Diagnosis not present

## 2019-12-28 DIAGNOSIS — E559 Vitamin D deficiency, unspecified: Secondary | ICD-10-CM | POA: Diagnosis not present

## 2019-12-28 LAB — AEROBIC CULTURE W GRAM STAIN (SUPERFICIAL SPECIMEN)

## 2019-12-28 NOTE — Assessment & Plan Note (Addendum)
Persistent R>L LE pain, multifactorial. Large portion due to poorly healing R foot ulcer affecting ability to walk - Rx for transport chair provided.  Increasing struggles noted at home by patient and daughter. No significant improvement in conditioning status between HHPT sessions. Discussed palliative care home evaluation as a good place to start - will refer. They will also look into personal care services at home.

## 2019-12-28 NOTE — Assessment & Plan Note (Signed)
Continues aspirin, eliquis, amiodarone and low dose metoprolol Sounds regular today

## 2019-12-28 NOTE — Telephone Encounter (Signed)
[facility] is reviewing her records now.  I can follow up with them in the morning.

## 2019-12-28 NOTE — Telephone Encounter (Signed)
Great thank you!

## 2019-12-28 NOTE — Telephone Encounter (Signed)
I'm sorry to hear about fall.  Would have them start with [facility] at [phone] in [place] [facility] [phone] to ask about personal care service recommendations. website is [email or web address]  Will also forward to [facility] to see if we can expedite palliative care referral.

## 2019-12-28 NOTE — Assessment & Plan Note (Signed)
Update labs.  

## 2019-12-28 NOTE — Assessment & Plan Note (Deleted)
S/p  Conservative nonoperative management. Continues healing well.

## 2019-12-28 NOTE — Assessment & Plan Note (Signed)
Concern for progressive memory trouble - I have asked pt/daughter to return in 3 wks for formal geriatric assessment.

## 2019-12-28 NOTE — Assessment & Plan Note (Signed)
Rpt B12 shot today.

## 2019-12-28 NOTE — Assessment & Plan Note (Addendum)
Followed by ortho planned conservative nonoperative management. Continues healing well.

## 2019-12-28 NOTE — Assessment & Plan Note (Signed)
Continues seeing [facility] wound RN and wound clinic for poorly healing R foot wound.  Latest treating possible infection with bactrim course - will update Cr with recent ARF while on bactrim.  S/p PAD interventions ([facility])

## 2019-12-28 NOTE — Assessment & Plan Note (Signed)
Continues cymbalta 60mg  daily.  Will need to further evaluate in setting of possible progressive memory trouble.

## 2019-12-28 NOTE — Assessment & Plan Note (Signed)
Continue gabapentin 300mg  nightly, tramadol.

## 2019-12-28 NOTE — Assessment & Plan Note (Addendum)
BP mildly elevated - we stopped antihypertensives after recent hospitalization with ARF - now only on metoprolol 12.5mg  bid. Consider restarting ACEI vs increasing metoprolol dosing.  She is currently off lasix/potassium.

## 2019-12-28 NOTE — Assessment & Plan Note (Addendum)
Prolia injection today.  Needs regularly Q30mo

## 2019-12-28 NOTE — Assessment & Plan Note (Signed)
Appreciate [facility] care.  

## 2019-12-28 NOTE — Telephone Encounter (Signed)
Patient's daughter left a voicemail stating that her mom is being referred to Palliative care and wants to know how soon that will be set up. [name] stated her mom fell again last night trying to get into bed and does not remember how and even getting herself up. [name] stated that Dr. Danise Mina had mentioned some personal care services that they might be able to get to help them out. Patient's daughter is anxious to get some help into the home. [name] stated that with her falling again their is an urgent need for help. Patient's daughter is requesting a call back about contact information on personal services that they can reach out to. [name] wants to know who is going to contact her about palliative care or if there is someone that she can contact?

## 2019-12-29 ENCOUNTER — Encounter: Payer: Self-pay | Admitting: Podiatry

## 2019-12-29 ENCOUNTER — Ambulatory Visit: Payer: Medicare Other | Admitting: Family Medicine

## 2019-12-29 NOTE — Progress Notes (Signed)
  Subjective:  Patient ID: [name], female    DOB: Apr 16, 1929,  MRN: [id number]  No chief complaint on file.  86 y.o. female returns for the above complaint.  Patient presents with thickened elongated dystrophic toenails x10.  Patient states that they are painful to touch.  She is not able to take care of her self.  Patient has a history of peripheral vascular disease without good circulation to both lower extremity.  She has secondary questions about dependent rubor and anything that can be done to help with that.  She denies any other acute complaints.  She would like to have them debrided down.  Objective:  There were no vitals filed for this visit. Podiatric Exam: Vascular: dorsalis pedis and posterior tibial pulses are non palpable bilateral. Capillary return is sluggish temperature gradient is WNL. Skin turgor WNL.  Dependent rubor noted to bilateral lower extremity Sensorium: Normal Semmes Weinstein monofilament test. Normal tactile sensation bilaterally. Nail Exam: Pt has thick disfigured discolored nails with subungual debris noted bilateral entire nail hallux through fifth toenails.  Pain on palpation to the nails. Ulcer Exam: There is no evidence of ulcer or pre-ulcerative changes or infection. Orthopedic Exam: Muscle tone and strength are WNL. No limitations in general ROM. No crepitus or effusions noted. HAV  B/L.  Hammer toes 2-5  B/L. Skin: No Porokeratosis. No infection or ulcers    Assessment & Plan:   1. Dependent rubor   2. Pain due to onychomycosis of toenails of both feet   3. Peripheral vascular disease ([place])     Patient was evaluated and treated and all questions answered.  Dependent rubor secondary to PVD bilaterally -I explained to patient the etiology of dependent rubor and various treatment options were discussed.  Even though patient had a hard flow optimized it may not be sufficient enough to help heal the ulceration which is primarily being managed by  the wound care center to the right lateral foot.  Given that patient has poor circulation to the foot as this may likely be attributing to dependent rubor as circulation is more stagnant in the lower extremity especially in a dependent position.  Patient states understanding.  She will work on elevation.  Onychomycosis with pain  -Nails palliatively debrided as below. -Educated on self-care  Procedure: Nail Debridement Rationale: pain  Type of Debridement: manual, sharp debridement. Instrumentation: Nail nipper, rotary burr. Number of Nails: 10  Procedures and Treatment: Consent by patient was obtained for treatment procedures. The patient understood the discussion of treatment and procedures well. All questions were answered thoroughly reviewed. Debridement of mycotic and hypertrophic toenails, 1 through 5 bilateral and clearing of subungual debris. No ulceration, no infection noted.  Return Visit-Office Procedure: Patient instructed to return to the office for a follow up visit 3 months for continued evaluation and treatment.  Boneta Lucks, DPM    No follow-ups on file.

## 2019-12-30 NOTE — Telephone Encounter (Signed)
Cathy with [facility] request cb with name of palliative care pt will be receiving. Request sent to [facility].

## 2019-12-31 ENCOUNTER — Telehealth: Payer: Self-pay | Admitting: Adult Health Nurse Practitioner

## 2019-12-31 DIAGNOSIS — M810 Age-related osteoporosis without current pathological fracture: Secondary | ICD-10-CM | POA: Diagnosis not present

## 2019-12-31 DIAGNOSIS — I272 Pulmonary hypertension, unspecified: Secondary | ICD-10-CM | POA: Diagnosis not present

## 2019-12-31 DIAGNOSIS — G9009 Other idiopathic peripheral autonomic neuropathy: Secondary | ICD-10-CM | POA: Diagnosis not present

## 2019-12-31 DIAGNOSIS — I739 Peripheral vascular disease, unspecified: Secondary | ICD-10-CM | POA: Diagnosis not present

## 2019-12-31 DIAGNOSIS — K219 Gastro-esophageal reflux disease without esophagitis: Secondary | ICD-10-CM | POA: Diagnosis not present

## 2019-12-31 DIAGNOSIS — G2581 Restless legs syndrome: Secondary | ICD-10-CM | POA: Diagnosis not present

## 2019-12-31 DIAGNOSIS — L97513 Non-pressure chronic ulcer of other part of right foot with necrosis of muscle: Secondary | ICD-10-CM | POA: Diagnosis not present

## 2019-12-31 DIAGNOSIS — Z7952 Long term (current) use of systemic steroids: Secondary | ICD-10-CM | POA: Diagnosis not present

## 2019-12-31 DIAGNOSIS — M1990 Unspecified osteoarthritis, unspecified site: Secondary | ICD-10-CM | POA: Diagnosis not present

## 2019-12-31 DIAGNOSIS — E538 Deficiency of other specified B group vitamins: Secondary | ICD-10-CM | POA: Diagnosis not present

## 2019-12-31 DIAGNOSIS — I2510 Atherosclerotic heart disease of native coronary artery without angina pectoris: Secondary | ICD-10-CM | POA: Diagnosis not present

## 2019-12-31 DIAGNOSIS — I5032 Chronic diastolic (congestive) heart failure: Secondary | ICD-10-CM | POA: Diagnosis not present

## 2019-12-31 DIAGNOSIS — I872 Venous insufficiency (chronic) (peripheral): Secondary | ICD-10-CM | POA: Diagnosis not present

## 2019-12-31 DIAGNOSIS — M5136 Other intervertebral disc degeneration, lumbar region: Secondary | ICD-10-CM | POA: Diagnosis not present

## 2019-12-31 DIAGNOSIS — I130 Hypertensive heart and chronic kidney disease with heart failure and stage 1 through stage 4 chronic kidney disease, or unspecified chronic kidney disease: Secondary | ICD-10-CM | POA: Diagnosis not present

## 2019-12-31 DIAGNOSIS — E559 Vitamin D deficiency, unspecified: Secondary | ICD-10-CM | POA: Diagnosis not present

## 2019-12-31 DIAGNOSIS — N1831 Chronic kidney disease, stage 3a: Secondary | ICD-10-CM | POA: Diagnosis not present

## 2019-12-31 DIAGNOSIS — Z9181 History of falling: Secondary | ICD-10-CM | POA: Diagnosis not present

## 2019-12-31 DIAGNOSIS — I4891 Unspecified atrial fibrillation: Secondary | ICD-10-CM | POA: Diagnosis not present

## 2019-12-31 DIAGNOSIS — S62102D Fracture of unspecified carpal bone, left wrist, subsequent encounter for fracture with routine healing: Secondary | ICD-10-CM | POA: Diagnosis not present

## 2019-12-31 DIAGNOSIS — Z7901 Long term (current) use of anticoagulants: Secondary | ICD-10-CM | POA: Diagnosis not present

## 2019-12-31 DIAGNOSIS — Z7982 Long term (current) use of aspirin: Secondary | ICD-10-CM | POA: Diagnosis not present

## 2019-12-31 DIAGNOSIS — E7849 Other hyperlipidemia: Secondary | ICD-10-CM | POA: Diagnosis not present

## 2019-12-31 NOTE — Telephone Encounter (Signed)
Spoke with daughter [name].  Patient with increasing R leg pain.  Saw podiatry Tuesday.  Upcoming appt Fri with wound care (Q2 wks) HH PT/OT, nurse aide finished. HH RN continues.

## 2019-12-31 NOTE — Telephone Encounter (Signed)
Spoke with Tye [place] - she is aware that [facility] is calling the patient's daughter today to start services. She will notify patient' daughter. I have made ACP aware that we are going to proceed with Palliative Care Services -- they are contacting the patient today.   Nothing further needed.

## 2019-12-31 NOTE — Telephone Encounter (Signed)
Spoke with patient's daughter, [name], regarding Palliative services/referral, all questions were answered and she was in agreement with scheduling visit.  I have scheduled an In-person Consult for 01/13/20 @ 2 PM.

## 2020-01-03 ENCOUNTER — Telehealth: Payer: Self-pay

## 2020-01-03 DIAGNOSIS — E538 Deficiency of other specified B group vitamins: Secondary | ICD-10-CM | POA: Diagnosis not present

## 2020-01-03 DIAGNOSIS — N1831 Chronic kidney disease, stage 3a: Secondary | ICD-10-CM | POA: Diagnosis not present

## 2020-01-03 DIAGNOSIS — M810 Age-related osteoporosis without current pathological fracture: Secondary | ICD-10-CM | POA: Diagnosis not present

## 2020-01-03 DIAGNOSIS — Z9181 History of falling: Secondary | ICD-10-CM | POA: Diagnosis not present

## 2020-01-03 DIAGNOSIS — M7062 Trochanteric bursitis, left hip: Secondary | ICD-10-CM | POA: Diagnosis not present

## 2020-01-03 DIAGNOSIS — M62830 Muscle spasm of back: Secondary | ICD-10-CM | POA: Diagnosis not present

## 2020-01-03 DIAGNOSIS — I2510 Atherosclerotic heart disease of native coronary artery without angina pectoris: Secondary | ICD-10-CM | POA: Diagnosis not present

## 2020-01-03 DIAGNOSIS — I739 Peripheral vascular disease, unspecified: Secondary | ICD-10-CM | POA: Diagnosis not present

## 2020-01-03 DIAGNOSIS — M5416 Radiculopathy, lumbar region: Secondary | ICD-10-CM | POA: Diagnosis not present

## 2020-01-03 DIAGNOSIS — G2581 Restless legs syndrome: Secondary | ICD-10-CM | POA: Diagnosis not present

## 2020-01-03 DIAGNOSIS — G9009 Other idiopathic peripheral autonomic neuropathy: Secondary | ICD-10-CM | POA: Diagnosis not present

## 2020-01-03 DIAGNOSIS — I272 Pulmonary hypertension, unspecified: Secondary | ICD-10-CM | POA: Diagnosis not present

## 2020-01-03 DIAGNOSIS — K219 Gastro-esophageal reflux disease without esophagitis: Secondary | ICD-10-CM | POA: Diagnosis not present

## 2020-01-03 DIAGNOSIS — M5136 Other intervertebral disc degeneration, lumbar region: Secondary | ICD-10-CM | POA: Diagnosis not present

## 2020-01-03 DIAGNOSIS — I5032 Chronic diastolic (congestive) heart failure: Secondary | ICD-10-CM | POA: Diagnosis not present

## 2020-01-03 DIAGNOSIS — S62102D Fracture of unspecified carpal bone, left wrist, subsequent encounter for fracture with routine healing: Secondary | ICD-10-CM | POA: Diagnosis not present

## 2020-01-03 DIAGNOSIS — Z7952 Long term (current) use of systemic steroids: Secondary | ICD-10-CM | POA: Diagnosis not present

## 2020-01-03 DIAGNOSIS — I872 Venous insufficiency (chronic) (peripheral): Secondary | ICD-10-CM | POA: Diagnosis not present

## 2020-01-03 DIAGNOSIS — I130 Hypertensive heart and chronic kidney disease with heart failure and stage 1 through stage 4 chronic kidney disease, or unspecified chronic kidney disease: Secondary | ICD-10-CM | POA: Diagnosis not present

## 2020-01-03 DIAGNOSIS — E559 Vitamin D deficiency, unspecified: Secondary | ICD-10-CM | POA: Diagnosis not present

## 2020-01-03 DIAGNOSIS — M1990 Unspecified osteoarthritis, unspecified site: Secondary | ICD-10-CM | POA: Diagnosis not present

## 2020-01-03 DIAGNOSIS — I4891 Unspecified atrial fibrillation: Secondary | ICD-10-CM | POA: Diagnosis not present

## 2020-01-03 DIAGNOSIS — Z7901 Long term (current) use of anticoagulants: Secondary | ICD-10-CM | POA: Diagnosis not present

## 2020-01-03 DIAGNOSIS — M48062 Spinal stenosis, lumbar region with neurogenic claudication: Secondary | ICD-10-CM | POA: Diagnosis not present

## 2020-01-03 DIAGNOSIS — E7849 Other hyperlipidemia: Secondary | ICD-10-CM | POA: Diagnosis not present

## 2020-01-03 DIAGNOSIS — Z7982 Long term (current) use of aspirin: Secondary | ICD-10-CM | POA: Diagnosis not present

## 2020-01-03 DIAGNOSIS — L97513 Non-pressure chronic ulcer of other part of right foot with necrosis of muscle: Secondary | ICD-10-CM | POA: Diagnosis not present

## 2020-01-03 NOTE — Telephone Encounter (Signed)
Patient daughter states patient is in the donut hole and would like samples of Eliquis

## 2020-01-03 NOTE — Telephone Encounter (Signed)
Left voicemail message informing daughter [name] that samples have been left in the front office for her to pick up for her mother.

## 2020-01-03 NOTE — Telephone Encounter (Signed)
Medication Samples have been provided to the patient.  Drug name: Eliquis      Strength: 2.5mg        Qty: 28  LOT: [id number]  Exp.Date: 03/2020  Dosing instructions: one tablet PO BID  Rene Paci [place] 11:59 AM 01/03/2020

## 2020-01-04 DIAGNOSIS — S62102D Fracture of unspecified carpal bone, left wrist, subsequent encounter for fracture with routine healing: Secondary | ICD-10-CM | POA: Diagnosis not present

## 2020-01-04 DIAGNOSIS — Z9181 History of falling: Secondary | ICD-10-CM | POA: Diagnosis not present

## 2020-01-04 DIAGNOSIS — I2510 Atherosclerotic heart disease of native coronary artery without angina pectoris: Secondary | ICD-10-CM | POA: Diagnosis not present

## 2020-01-04 DIAGNOSIS — Z7952 Long term (current) use of systemic steroids: Secondary | ICD-10-CM | POA: Diagnosis not present

## 2020-01-04 DIAGNOSIS — E538 Deficiency of other specified B group vitamins: Secondary | ICD-10-CM | POA: Diagnosis not present

## 2020-01-04 DIAGNOSIS — M5136 Other intervertebral disc degeneration, lumbar region: Secondary | ICD-10-CM | POA: Diagnosis not present

## 2020-01-04 DIAGNOSIS — L97513 Non-pressure chronic ulcer of other part of right foot with necrosis of muscle: Secondary | ICD-10-CM | POA: Diagnosis not present

## 2020-01-04 DIAGNOSIS — I130 Hypertensive heart and chronic kidney disease with heart failure and stage 1 through stage 4 chronic kidney disease, or unspecified chronic kidney disease: Secondary | ICD-10-CM | POA: Diagnosis not present

## 2020-01-04 DIAGNOSIS — Z7982 Long term (current) use of aspirin: Secondary | ICD-10-CM | POA: Diagnosis not present

## 2020-01-04 DIAGNOSIS — Z7901 Long term (current) use of anticoagulants: Secondary | ICD-10-CM | POA: Diagnosis not present

## 2020-01-04 DIAGNOSIS — N1831 Chronic kidney disease, stage 3a: Secondary | ICD-10-CM | POA: Diagnosis not present

## 2020-01-04 DIAGNOSIS — I272 Pulmonary hypertension, unspecified: Secondary | ICD-10-CM | POA: Diagnosis not present

## 2020-01-04 DIAGNOSIS — I739 Peripheral vascular disease, unspecified: Secondary | ICD-10-CM | POA: Diagnosis not present

## 2020-01-04 DIAGNOSIS — E7849 Other hyperlipidemia: Secondary | ICD-10-CM | POA: Diagnosis not present

## 2020-01-04 DIAGNOSIS — G9009 Other idiopathic peripheral autonomic neuropathy: Secondary | ICD-10-CM | POA: Diagnosis not present

## 2020-01-04 DIAGNOSIS — M810 Age-related osteoporosis without current pathological fracture: Secondary | ICD-10-CM | POA: Diagnosis not present

## 2020-01-04 DIAGNOSIS — G2581 Restless legs syndrome: Secondary | ICD-10-CM | POA: Diagnosis not present

## 2020-01-04 DIAGNOSIS — E559 Vitamin D deficiency, unspecified: Secondary | ICD-10-CM | POA: Diagnosis not present

## 2020-01-04 DIAGNOSIS — K219 Gastro-esophageal reflux disease without esophagitis: Secondary | ICD-10-CM | POA: Diagnosis not present

## 2020-01-04 DIAGNOSIS — I872 Venous insufficiency (chronic) (peripheral): Secondary | ICD-10-CM | POA: Diagnosis not present

## 2020-01-04 DIAGNOSIS — M1990 Unspecified osteoarthritis, unspecified site: Secondary | ICD-10-CM | POA: Diagnosis not present

## 2020-01-04 DIAGNOSIS — I5032 Chronic diastolic (congestive) heart failure: Secondary | ICD-10-CM | POA: Diagnosis not present

## 2020-01-04 DIAGNOSIS — I4891 Unspecified atrial fibrillation: Secondary | ICD-10-CM | POA: Diagnosis not present

## 2020-01-05 ENCOUNTER — Telehealth: Payer: Self-pay | Admitting: Family Medicine

## 2020-01-05 NOTE — Telephone Encounter (Signed)
[name]  Called to verbal order for home health aid to help with bathing

## 2020-01-05 NOTE — Telephone Encounter (Signed)
Agree with this. Thank you.  

## 2020-01-05 NOTE — Telephone Encounter (Signed)
Spoke with [name] informing her Dr. Darnell Level is giving verbal orders for [facility] aid to help pt with bathing.  She verbalizes understanding.

## 2020-01-06 ENCOUNTER — Telehealth: Payer: Self-pay

## 2020-01-06 ENCOUNTER — Encounter: Payer: Medicare Other | Admitting: Physician Assistant

## 2020-01-06 ENCOUNTER — Other Ambulatory Visit: Payer: Self-pay

## 2020-01-06 DIAGNOSIS — Z7901 Long term (current) use of anticoagulants: Secondary | ICD-10-CM | POA: Diagnosis not present

## 2020-01-06 DIAGNOSIS — I480 Paroxysmal atrial fibrillation: Secondary | ICD-10-CM | POA: Diagnosis not present

## 2020-01-06 DIAGNOSIS — L97512 Non-pressure chronic ulcer of other part of right foot with fat layer exposed: Secondary | ICD-10-CM | POA: Diagnosis not present

## 2020-01-06 DIAGNOSIS — M1990 Unspecified osteoarthritis, unspecified site: Secondary | ICD-10-CM | POA: Diagnosis not present

## 2020-01-06 DIAGNOSIS — I739 Peripheral vascular disease, unspecified: Secondary | ICD-10-CM | POA: Diagnosis not present

## 2020-01-06 NOTE — Progress Notes (Signed)
[name], [name] ([id number]) Visit Report for 01/06/2020 Chief Complaint Document Details Patient Name: [name], [name]. Date of Service: 01/06/2020 3:30 PM Medical Record Number: [id number] Patient Account Number: [id number] Date of Birth/Sex: Jul 21, 1929 (86 y.o. F) Treating RN: Primary Care Provider: Ria Bush Other Clinician: Referring Provider: Ria Bush Treating Provider/Extender: Melburn Hake, Silvestre Mines Weeks in Treatment: 12 Information Obtained from: Patient Chief Complaint Right Lateral Foot Ulcer Electronic Signature(s) Signed: 01/06/2020 3:38:21 PM By: Worthy Keeler PA-C Entered By: Worthy Keeler on 01/06/2020 15:38:20 [name], [name] ([id number]) -------------------------------------------------------------------------------- HPI Details Patient Name: [name] Date of Service: 01/06/2020 3:30 PM Medical Record Number: [id number] Patient Account Number: [id number] Date of Birth/Sex: 01-08-1930 (86 y.o. F) Treating RN: Primary Care Provider: Ria Bush Other Clinician: Referring Provider: Ria Bush Treating Provider/Extender: Melburn Hake, Makeda Peeks Weeks in Treatment: 12 History of Present Illness HPI Description: 10/12/2019 upon evaluation today patient appears to be doing somewhat poorly in regard to her wound at this point. She has a ulcer over the right fifth metatarsal region. This has been managed by podiatry up to this point. Unfortunately the patient just does not seem to be making the progress that they were hoping for an subsequently the patient was referred to [place] by [facility] Dr. Posey Pronto. With that being said I did note that the patient does have peripheral vascular disease which has been managed by Dr. Lazaro Arms her most recent angiogram with intervention was on 08/02/2019. The ABIs following were on the right 1.30 with a TBI of 0.61 normal left 1.31 with a TBI of 0.58. Obviously things seem to be doing somewhat better at this  point hopefully enough to allow her to heal. She is on anticoagulant therapy due to atrial fibrillation long-term. With that being said the patient is unfortunately having quite a bit of discomfort at this time secondary to the wound. She also has erythema which is very likely secondary to infection. I am going obtain a culture of the area today once debridement is complete. Apparently the patient also had a x-ray that was performed by Dr. Posey Pronto on 09/21/2019 and this showed that the patient had no obvious signs on x-ray of cortical irregularity and no osteomyelitis obviously noted. With that being said as I discussed with the patient's daughter as well this does not indicate that the patient absolutely has no osteomyelitis as x-rays are only at best 50% helpful in identifying this complication. Nonetheless I do believe that we need to have the patient continue to have this further evaluated specifically I am to recommend that we check into an MRI which I think would be ideal to evaluate whether or not there is osteomyelitis present. 7/16; this is a patient with an ulcer over the right fifth metatarsal head. She [facility] with Dr. Lucky Cowboy [facility]. She is due to have an MRI of the foot on 7/26. Using Santyl to the wound 11/04/2019 on evaluation today patient appears to be doing okay in regard to her wound at this time. Unfortunately since I last saw her which was actually her second visit here in the clinic she has moved into a new apartment and then subsequently had a fall. She ended up in the hospital she did fracture her left arm and subsequently has also been dealing with a lot of dizziness she has been at [employer]. She tells me that getting in the change the dressing has been somewhat difficult as far as daily dressing changes are  concerned with the Santyl. Nonetheless when they do change it that is what is been used and her daughter-in-law has also been coming in to help out as  well with the dressing changes to try to make sure it gets done daily. Fortunately there is no signs of active infection at this time. No fever chills noted. I do believe the Santyl has been beneficial and is likely still the best way to go at this point for the patient. I did review the patient's MRI which showed some potential for reactive osteitis but did not show any signs of osteomyelitis overtly. That cannot be excluded but again right now it does not appear that is very likely present as well. This is good news. I also did review her culture she has been on the Bactrim she tells me she has taken this and again that was appropriate based on the results of her culture. 11/19/2019 upon evaluation today patient appears to be doing decently well in regard to her wound. Fortunately there is no signs of active infection at this time wound is not significantly larger in fact is roughly about the same at this point. With that being said there is not as much erythema as previously noted I think the infection seems to be under much better control which is great news. Unfortunately she has fallen since I last saw her and broken her left arm she has an appointment with them following the appointment with me. 8/25; patient was worked in acutely today at the request of home health and was concerned about her right heel, generalized erythema in her feet bilaterally. According to her daughter things have been getting worse over the last for 5 days but not precisely from a wound care point of view. Her wound is on the lateral part of the right fifth metatarsal head looks about the same as I remember this. She has developed dryness and flaking of the skin on her plantar feet. As well a very tender area localized over the insertion point of the Achilles tendon. She has restless leg syndrome. Does not specifically offload the area at night. She wears socks at home and does not really give me a pressure on the right  heel issue. I reviewed her vascular status. She underwent her last revascularization on 08/02/2019 by Dr. Lucky Cowboy. This included an angioplasty of the right peroneal artery as well as the right posterior tibial artery. Her last noninvasive arterial study was on 11/23/2019. On the right her ABI was 1.08 TBI of 0.48. On the left ABI at 1.21 with a TBI of 0.54. Family relates that Dr. Lucky Cowboy felt that he had done the best he could and this. We have been using Santyl to the one wound she has on the right lateral fifth metatarsal head. She does not have a new wound today which is the concern that letter coming into the clinic 12/09/2019 on evaluation today patient appears to be doing decently well in regard to her foot ulcer at this point. She does note that with the compression wrap that the home health nurse put on after contacting our clinic that she actually did much better and felt much better. Her heel is also improved. Fortunately there is no signs of active infection at this time. No fevers, chills, nausea, vomiting, or diarrhea. 12/24/2019 upon evaluation today patient appears to be doing somewhat poorly still in regard to her foot. Unfortunately this is just not showing the signs of improvement that I would like  to see it is a little bit cleaner but also appears to be erythematous and I believe there is infection noted at this point. We will get a need to address this in my opinion. I did actually recommend that we switch back to the Iodoflex as well apparently the home health nurse had switch this away that not really what we want to do at this point in my opinion. They were using silver alginate I think most recently. 01/06/2020 upon evaluation today patient actually appears to be doing much better in regard to her wound. We have switched back to the silver alginate after her home health nurse contacted me and honestly I feel like she is doing quite well in that regard. She does have a lot of drainage and  therefore I think that is probably a better option for. With that being said she is tolerating the compression wrap without complication that also seems to be helping. Electronic Signature(s) HADASSAH, RANA ([id number]) Signed: 01/06/2020 4:16:49 PM By: Worthy Keeler PA-C Entered By: Worthy Keeler on 01/06/2020 16:16:49 [name], [name] ([id number]) -------------------------------------------------------------------------------- Physical Exam Details Patient Name: [name], [name]. Date of Service: 01/06/2020 3:30 PM Medical Record Number: [id number] Patient Account Number: [id number] Date of Birth/Sex: 24-Jul-1929 (86 y.o. F) Treating RN: Primary Care Provider: Ria Bush Other Clinician: Referring Provider: Ria Bush Treating Provider/Extender: STONE III, Chadwin Fury Weeks in Treatment: 82 Constitutional Well-nourished and well-hydrated in no acute distress. Respiratory normal breathing without difficulty. Psychiatric this patient is able to make decisions and demonstrates good insight into disease process. Alert and Oriented x 3. pleasant and cooperative. Notes Patient's wound bed currently showed signs of good granulation at this time. There does not appear to be any evidence of active infection which is great news and overall very pleased with where things stand at this point. Electronic Signature(s) Signed: 01/06/2020 4:17:04 PM By: Worthy Keeler PA-C Entered By: Worthy Keeler on 01/06/2020 16:17:03 [name], [name] ([id number]) -------------------------------------------------------------------------------- Physician Orders Details Patient Name: [name] Date of Service: 01/06/2020 3:30 PM Medical Record Number: [id number] Patient Account Number: [id number] Date of Birth/Sex: 21-May-1929 (86 y.o. F) Treating RN: Grover Canavan Primary Care Provider: Ria Bush Other Clinician: Referring Provider: Ria Bush Treating Provider/Extender:  Melburn Hake, Mosella Kasa Weeks in Treatment: 12 Verbal / Phone Orders: No Diagnosis Coding ICD-10 Coding Code Description I73.89 Other specified peripheral vascular diseases L97.512 Non-pressure chronic ulcer of other part of right foot with fat layer exposed Z79.01 Long term (current) use of anticoagulants I48.0 Paroxysmal atrial fibrillation B35.3 Tinea pedis Wound Cleansing Wound #3 Right Metatarsal head fifth o Clean wound with Normal Saline. Skin Barriers/Peri-Wound Care o Antifungal cream - to right heel area Primary Wound Dressing Wound #3 Right Metatarsal head fifth o Silver Alginate Secondary Dressing Wound #3 Right Metatarsal head fifth o ABD pad o Other - heel cup to right heel Dressing Change Frequency Wound #3 Right Metatarsal head fifth o Other: - Tuesday and Friday. HH to change Tues and Friday weeks patient does not come to the [facility] and on opposite weeks [place] to changed on Tuesday and [place] changes on Friday. Follow-up Appointments Wound #3 Right Metatarsal head fifth o Return Appointment in 3 weeks. Edema Control Wound #3 Right Metatarsal head fifth o 3 Layer Compression System - Right Lower Extremity o Elevate legs to the level of the heart and pump ankles as often as possible Off-Loading Wound #3 Right Metatarsal head fifth o Other: - Keep pressure off of the  wound, heel cup on right heel Additional Orders / Instructions Wound #3 Right Metatarsal head fifth o Increase protein intake. [place] Visits [name], [name] ([id number]) o [place] Nurse may visit PRN to address patientos wound care needs. o FACE TO FACE ENCOUNTER: MEDICARE and MEDICAID PATIENTS: I certify that this patient is under my care and that I had a face-to-face encounter that meets the physician face-to-face encounter requirements with this patient on this date. The encounter with the patient was in whole or in part for the following MEDICAL  CONDITION: (primary reason for [place]) MEDICAL NECESSITY: I certify, that based on my findings, NURSING services are a medically necessary home health service. HOME BOUND STATUS: I certify that my clinical findings support that this patient is homebound (i.e., Due to illness or injury, pt requires aid of supportive devices such as crutches, cane, wheelchairs, walkers, the use of special transportation or the assistance of another person to leave their place of residence. There is a normal inability to leave the home and doing so requires considerable and taxing effort. Other absences are for medical reasons / religious services and are infrequent or of short duration when for other reasons). o If current dressing causes regression in wound condition, may D/C ordered dressing product/s and apply Normal Saline Moist Dressing daily until next [place] / Other MD appointment. [place] of regression in wound condition at [phone]. o Please direct any NON-WOUND related issues/requests for orders to patient's Primary Care Physician Patient Medications Allergies: penicillin Notifications Medication Indication Start End Bactrim DS 01/06/2020 DOSE 1 - oral 800 mg-160 mg tablet - 1 tablet oral taken 2 times per day for 14 days. Do not take potassium while on this medication Electronic Signature(s) Signed: 01/06/2020 4:19:42 PM By: Worthy Keeler PA-C Entered By: Worthy Keeler on 01/06/2020 16:19:41 [name], [name] ([id number]) -------------------------------------------------------------------------------- Problem List Details Patient Name: [name] Date of Service: 01/06/2020 3:30 PM Medical Record Number: [id number] Patient Account Number: [id number] Date of Birth/Sex: January 25, 1930 (86 y.o. F) Treating RN: Primary Care Provider: Ria Bush Other Clinician: Referring Provider: Ria Bush Treating Provider/Extender: Melburn Hake,  Lynnann Knudsen Weeks in Treatment: 12 Active Problems ICD-10 Encounter Code Description Active Date MDM Diagnosis I73.89 Other specified peripheral vascular diseases 10/12/2019 No Yes L97.512 Non-pressure chronic ulcer of other part of right foot with fat layer 10/12/2019 No Yes exposed Z79.01 Long term (current) use of anticoagulants 10/12/2019 No Yes I48.0 Paroxysmal atrial fibrillation 10/12/2019 No Yes B35.3 Tinea pedis 12/01/2019 No Yes Inactive Problems Resolved Problems Electronic Signature(s) Signed: 01/06/2020 3:08:56 PM By: Worthy Keeler PA-C Entered By: Worthy Keeler on 01/06/2020 15:08:56 [name] ([id number]) -------------------------------------------------------------------------------- Progress Note Details Patient Name: [name]. Date of Service: 01/06/2020 3:30 PM Medical Record Number: [id number] Patient Account Number: [id number] Date of Birth/Sex: Feb 21, 1930 (86 y.o. F) Treating RN: Primary Care Provider: Ria Bush Other Clinician: Referring Provider: Ria Bush Treating Provider/Extender: Melburn Hake, Oma Alpert Weeks in Treatment: 12 Subjective Chief Complaint Information obtained from Patient Right Lateral Foot Ulcer History of Present Illness (HPI) 10/12/2019 upon evaluation today patient appears to be doing somewhat poorly in regard to her wound at this point. She has a ulcer over the right fifth metatarsal region. This has been managed by podiatry up to this point. Unfortunately the patient just does not seem to be making the progress that they were hoping for an subsequently the patient was referred to [place] by [facility] Dr. Posey Pronto. With that being said I did note that the patient does have peripheral vascular disease which has been managed by Dr. Lazaro Arms her most recent angiogram with intervention was on 08/02/2019. The ABIs following were on the right 1.30 with a TBI of 0.61 normal left 1.31 with a TBI of 0.58. Obviously things seem to  be doing somewhat better at this point hopefully enough to allow her to heal. She is on anticoagulant therapy due to atrial fibrillation long-term. With that being said the patient is unfortunately having quite a bit of discomfort at this time secondary to the wound. She also has erythema which is very likely secondary to infection. I am going obtain a culture of the area today once debridement is complete. Apparently the patient also had a x-ray that was performed by Dr. Posey Pronto on 09/21/2019 and this showed that the patient had no obvious signs on x-ray of cortical irregularity and no osteomyelitis obviously noted. With that being said as I discussed with the patient's daughter as well this does not indicate that the patient absolutely has no osteomyelitis as x-rays are only at best 50% helpful in identifying this complication. Nonetheless I do believe that we need to have the patient continue to have this further evaluated specifically I am to recommend that we check into an MRI which I think would be ideal to evaluate whether or not there is osteomyelitis present. 7/16; this is a patient with an ulcer over the right fifth metatarsal head. She follows with Dr. Lucky Cowboy [facility]. She is due to have an MRI of the foot on 7/26. Using Santyl to the wound 11/04/2019 on evaluation today patient appears to be doing okay in regard to her wound at this time. Unfortunately since I last saw her which was actually her second visit here in the clinic she has moved into a new apartment and then subsequently had a fall. She ended up in the hospital she did fracture her left arm and subsequently has also been dealing with a lot of dizziness she has been at [employer]. She tells me that getting in the change the dressing has been somewhat difficult as far as daily dressing changes are concerned with the Santyl. Nonetheless when they do change it that is what is been used and her daughter-in-law has also  been coming in to help out as well with the dressing changes to try to make sure it gets done daily. Fortunately there is no signs of active infection at this time. No fever chills noted. I do believe the Santyl has been beneficial and is likely still the best way to go at this point for the patient. I did review the patient's MRI which showed some potential for reactive osteitis but did not show any signs of osteomyelitis overtly. That cannot be excluded but again right now it does not appear that is very likely present as well. This is good news. I also did review her culture she has been on the Bactrim she tells me she has taken this and again that was appropriate based on the results of her culture. 11/19/2019 upon evaluation today patient appears to be doing decently well in regard to her wound. Fortunately there is no signs of active infection at this time wound is not significantly larger in fact is roughly about the same at this point. With that being said there is not as much erythema as previously noted I think the infection  seems to be under much better control which is great news. Unfortunately she has fallen since I last saw her and broken her left arm she has an appointment with them following the appointment with me. 8/25; patient was worked in acutely today at the request of home health and was concerned about her right heel, generalized erythema in her feet bilaterally. According to her daughter things have been getting worse over the last for 5 days but not precisely from a wound care point of view. Her wound is on the lateral part of the right fifth metatarsal head looks about the same as I remember this. She has developed dryness and flaking of the skin on her plantar feet. As well a very tender area localized over the insertion point of the Achilles tendon. She has restless leg syndrome. Does not specifically offload the area at night. She wears socks at home and does not really  give me a pressure on the right heel issue. I reviewed her vascular status. She underwent her last revascularization on 08/02/2019 by Dr. Lucky Cowboy. This included an angioplasty of the right peroneal artery as well as the right posterior tibial artery. Her last noninvasive arterial study was on 11/23/2019. On the right her ABI was 1.08 TBI of 0.48. On the left ABI at 1.21 with a TBI of 0.54. Family relates that Dr. Lucky Cowboy felt that he had done the best he could and this. We have been using Santyl to the one wound she has on the right lateral fifth metatarsal head. She does not have a new wound today which is the concern that letter coming into the clinic 12/09/2019 on evaluation today patient appears to be doing decently well in regard to her foot ulcer at this point. She does note that with the compression wrap that the home health nurse put on after contacting our clinic that she actually did much better and felt much better. Her heel is also improved. Fortunately there is no signs of active infection at this time. No fevers, chills, nausea, vomiting, or diarrhea. 12/24/2019 upon evaluation today patient appears to be doing somewhat poorly still in regard to her foot. Unfortunately this is just not showing the signs of improvement that I would like to see it is a little bit cleaner but also appears to be erythematous and I believe there is infection noted at this point. We will get a need to address this in my opinion. I did actually recommend that we switch back to the Iodoflex as well apparently the home health nurse had switch this away that not really what we want to do at this point in my opinion. They were using silver alginate I think most recently. 01/06/2020 upon evaluation today patient actually appears to be doing much better in regard to her wound. We have switched back to the silver alginate after her home health nurse contacted me and honestly I feel like she is doing quite well in that regard. She  does have a lot of drainage and therefore I think that is probably a better option for. With that being said she is tolerating the compression wrap without complication that [name], [name]. ([id number]) also seems to be helping. Objective Constitutional Well-nourished and well-hydrated in no acute distress. Vitals Time Taken: 3:15 AM, Height: 62 in, Weight: 140 lbs, BMI: 25.6, Temperature: 98.3 F, Pulse: 70 bpm, Respiratory Rate: 20 breaths/min, Blood Pressure: 188/80 mmHg. Respiratory normal breathing without difficulty. Psychiatric this patient is able to make decisions and  demonstrates good insight into disease process. Alert and Oriented x 3. pleasant and cooperative. General Notes: Patient's wound bed currently showed signs of good granulation at this time. There does not appear to be any evidence of active infection which is great news and overall very pleased with where things stand at this point. Integumentary (Hair, Skin) Wound #3 status is Open. Original cause of wound was Gradually Appeared. The wound is located on the Right Metatarsal head fifth. The wound measures 1.5cm length x 1.7cm width x 0.3cm depth; 2.003cm^2 area and 0.601cm^3 volume. There is Fat Layer (Subcutaneous Tissue) exposed. There is a medium amount of serosanguineous drainage noted. The wound margin is distinct with the outline attached to the wound base. There is small (1-33%) red granulation within the wound bed. There is a large (67-100%) amount of necrotic tissue within the wound bed including Adherent Slough. Assessment Active Problems ICD-10 Other specified peripheral vascular diseases Non-pressure chronic ulcer of other part of right foot with fat layer exposed Long term (current) use of anticoagulants Paroxysmal atrial fibrillation Tinea pedis Procedures Wound #3 Pre-procedure diagnosis of Wound #3 is an Arterial Insufficiency Ulcer located on the Right Metatarsal head fifth . There was a Three  Layer Compression Therapy Procedure by Grover Canavan, RN. Post procedure Diagnosis Wound #3: Same as Pre-Procedure Plan Wound Cleansing: Wound #3 Right Metatarsal head fifth: Clean wound with Normal Saline. Skin Barriers/Peri-Wound Care: Antifungal cream - to right heel area [name], [name]. ([id number]) Primary Wound Dressing: Wound #3 Right Metatarsal head fifth: Silver Alginate Secondary Dressing: Wound #3 Right Metatarsal head fifth: ABD pad Other - heel cup to right heel Dressing Change Frequency: Wound #3 Right Metatarsal head fifth: Other: - Tuesday and Friday. HH to change Tues and Friday weeks patient does not come to the [facility] and on opposite weeks [place] to changed on Tuesday and [place] changes on Friday. Follow-up Appointments: Wound #3 Right Metatarsal head fifth: Return Appointment in 3 weeks. Edema Control: Wound #3 Right Metatarsal head fifth: 3 Layer Compression System - Right Lower Extremity Elevate legs to the level of the heart and pump ankles as often as possible Off-Loading: Wound #3 Right Metatarsal head fifth: Other: - Keep pressure off of the wound, heel cup on right heel Additional Orders / Instructions: Wound #3 Right Metatarsal head fifth: Increase protein intake. Home Health: [facility] Nurse may visit PRN to address patient s wound care needs. FACE TO FACE ENCOUNTER: MEDICARE and MEDICAID PATIENTS: I certify that this patient is under my care and that I had a face-to-face encounter that meets the physician face-to-face encounter requirements with this patient on this date. The encounter with the patient was in whole or in part for the following MEDICAL CONDITION: (primary reason for [place]) MEDICAL NECESSITY: I certify, that based on my findings, NURSING services are a medically necessary home health service. HOME BOUND STATUS: I certify that my clinical findings support that this patient is homebound (i.e., Due to  illness or injury, pt requires aid of supportive devices such as crutches, cane, wheelchairs, walkers, the use of special transportation or the assistance of another person to leave their place of residence. There is a normal inability to leave the home and doing so requires considerable and taxing effort. Other absences are for medical reasons / religious services and are infrequent or of short duration when for other reasons). If current dressing causes regression in wound condition, may D/C ordered dressing product/s and apply Normal Saline  Moist Dressing daily until next [place] / Other MD appointment. [place] of regression in wound condition at [phone]. Please direct any NON-WOUND related issues/requests for orders to patient's Primary Care Physician The following medication(s) was prescribed: Bactrim DS oral 800 mg-160 mg tablet 1 1 tablet oral taken 2 times per day for 14 days. Do not take potassium while on this medication starting 01/06/2020 1. I would recommend at this time that we go ahead and continue with the silver alginate dressing we will see how this does over the next several weeks. 2. I am also can recommend that we continue with the antibiotics. Again I have previously given her the prescription for Bactrim she has been tolerating that well and the infection on in regard to the foot is doing much better which is great news. With that being said I am going to continue this for a bit longer and I will go ahead and send in a refill the medication today. 3. With regard to the compression wrap we will get a continue with a 3 layer compression wrap this keeping her edema under good control. We will see patient back for reevaluation in 3 weeks here in the clinic. If anything worsens or changes patient will contact our office for additional recommendations. Electronic Signature(s) Signed: 01/06/2020 4:19:57 PM By: Worthy Keeler PA-C Entered By:  Worthy Keeler on 01/06/2020 16:19:56 [name] ([id number]) -------------------------------------------------------------------------------- SuperBill Details Patient Name: [name] Date of Service: 01/06/2020 Medical Record Number: [id number] Patient Account Number: [id number] Date of Birth/Sex: 06-Feb-1930 (86 y.o. F) Treating RN: Grover Canavan Primary Care Provider: Ria Bush Other Clinician: Referring Provider: Ria Bush Treating Provider/Extender: Melburn Hake, [place] Strieter Weeks in Treatment: 12 Diagnosis Coding ICD-10 Codes Code Description I73.89 Other specified peripheral vascular diseases L97.512 Non-pressure chronic ulcer of other part of right foot with fat layer exposed Z79.01 Long term (current) use of anticoagulants I48.0 Paroxysmal atrial fibrillation B35.3 Tinea pedis Facility Procedures CPT4 Code: 54982641 Description: (Facility Use Only) [phone] - APPLY [facility] LWR RT LEG Modifier: Quantity: 1 Physician Procedures CPT4 Code: 7680881 Description: 10315 - WC PHYS LEVEL 4 - EST PT Modifier: Quantity: 1 CPT4 Code: Description: ICD-10 Diagnosis Description I73.89 Other specified peripheral vascular diseases L97.512 Non-pressure chronic ulcer of other part of right foot with fat layer e Z79.01 Long term (current) use of anticoagulants I48.0 Paroxysmal atrial  fibrillation Modifier: xposed Quantity: Electronic Signature(s) Signed: 01/06/2020 4:20:10 PM By: Worthy Keeler PA-C Entered By: Worthy Keeler on 01/06/2020 16:20:09

## 2020-01-06 NOTE — Telephone Encounter (Signed)
Received faxed Detailed Written Order form for wound care.  Placed form in Dr. Synthia Innocent box.

## 2020-01-07 ENCOUNTER — Ambulatory Visit: Payer: Medicare Other | Admitting: Physician Assistant

## 2020-01-07 NOTE — Telephone Encounter (Signed)
Filled and in [name]'s box 

## 2020-01-10 DIAGNOSIS — I739 Peripheral vascular disease, unspecified: Secondary | ICD-10-CM | POA: Diagnosis not present

## 2020-01-10 DIAGNOSIS — N1831 Chronic kidney disease, stage 3a: Secondary | ICD-10-CM | POA: Diagnosis not present

## 2020-01-10 DIAGNOSIS — M5136 Other intervertebral disc degeneration, lumbar region: Secondary | ICD-10-CM | POA: Diagnosis not present

## 2020-01-10 DIAGNOSIS — S52502D Unspecified fracture of the lower end of left radius, subsequent encounter for closed fracture with routine healing: Secondary | ICD-10-CM | POA: Diagnosis not present

## 2020-01-10 DIAGNOSIS — I272 Pulmonary hypertension, unspecified: Secondary | ICD-10-CM | POA: Diagnosis not present

## 2020-01-10 DIAGNOSIS — S62102D Fracture of unspecified carpal bone, left wrist, subsequent encounter for fracture with routine healing: Secondary | ICD-10-CM | POA: Diagnosis not present

## 2020-01-10 DIAGNOSIS — G9009 Other idiopathic peripheral autonomic neuropathy: Secondary | ICD-10-CM | POA: Diagnosis not present

## 2020-01-10 DIAGNOSIS — Z7901 Long term (current) use of anticoagulants: Secondary | ICD-10-CM | POA: Diagnosis not present

## 2020-01-10 DIAGNOSIS — E7849 Other hyperlipidemia: Secondary | ICD-10-CM | POA: Diagnosis not present

## 2020-01-10 DIAGNOSIS — Z7952 Long term (current) use of systemic steroids: Secondary | ICD-10-CM | POA: Diagnosis not present

## 2020-01-10 DIAGNOSIS — E538 Deficiency of other specified B group vitamins: Secondary | ICD-10-CM | POA: Diagnosis not present

## 2020-01-10 DIAGNOSIS — G2581 Restless legs syndrome: Secondary | ICD-10-CM | POA: Diagnosis not present

## 2020-01-10 DIAGNOSIS — Z7982 Long term (current) use of aspirin: Secondary | ICD-10-CM | POA: Diagnosis not present

## 2020-01-10 DIAGNOSIS — M810 Age-related osteoporosis without current pathological fracture: Secondary | ICD-10-CM | POA: Diagnosis not present

## 2020-01-10 DIAGNOSIS — Z9181 History of falling: Secondary | ICD-10-CM | POA: Diagnosis not present

## 2020-01-10 DIAGNOSIS — M1990 Unspecified osteoarthritis, unspecified site: Secondary | ICD-10-CM | POA: Diagnosis not present

## 2020-01-10 DIAGNOSIS — I2510 Atherosclerotic heart disease of native coronary artery without angina pectoris: Secondary | ICD-10-CM | POA: Diagnosis not present

## 2020-01-10 DIAGNOSIS — E559 Vitamin D deficiency, unspecified: Secondary | ICD-10-CM | POA: Diagnosis not present

## 2020-01-10 DIAGNOSIS — S52602D Unspecified fracture of lower end of left ulna, subsequent encounter for closed fracture with routine healing: Secondary | ICD-10-CM | POA: Diagnosis not present

## 2020-01-10 DIAGNOSIS — I130 Hypertensive heart and chronic kidney disease with heart failure and stage 1 through stage 4 chronic kidney disease, or unspecified chronic kidney disease: Secondary | ICD-10-CM | POA: Diagnosis not present

## 2020-01-10 DIAGNOSIS — K219 Gastro-esophageal reflux disease without esophagitis: Secondary | ICD-10-CM | POA: Diagnosis not present

## 2020-01-10 DIAGNOSIS — L97513 Non-pressure chronic ulcer of other part of right foot with necrosis of muscle: Secondary | ICD-10-CM | POA: Diagnosis not present

## 2020-01-10 DIAGNOSIS — I5032 Chronic diastolic (congestive) heart failure: Secondary | ICD-10-CM | POA: Diagnosis not present

## 2020-01-10 DIAGNOSIS — I872 Venous insufficiency (chronic) (peripheral): Secondary | ICD-10-CM | POA: Diagnosis not present

## 2020-01-10 DIAGNOSIS — I4891 Unspecified atrial fibrillation: Secondary | ICD-10-CM | POA: Diagnosis not present

## 2020-01-10 NOTE — Telephone Encounter (Signed)
Spoke with pt's daughter, [name] (on dpr), asking about wound care.  States pt is still being treated for her foot.  To her knowledge, the nurse comes twice a week to clean wound and change bandages. FYI to Dr. Darnell Level.  Faxed order.

## 2020-01-13 ENCOUNTER — Other Ambulatory Visit: Payer: Medicare Other | Admitting: Adult Health Nurse Practitioner

## 2020-01-13 ENCOUNTER — Other Ambulatory Visit: Payer: Self-pay | Admitting: Cardiovascular Disease

## 2020-01-13 ENCOUNTER — Other Ambulatory Visit: Payer: Self-pay

## 2020-01-13 DIAGNOSIS — Z7982 Long term (current) use of aspirin: Secondary | ICD-10-CM | POA: Diagnosis not present

## 2020-01-13 DIAGNOSIS — Z9181 History of falling: Secondary | ICD-10-CM | POA: Diagnosis not present

## 2020-01-13 DIAGNOSIS — I5032 Chronic diastolic (congestive) heart failure: Secondary | ICD-10-CM | POA: Diagnosis not present

## 2020-01-13 DIAGNOSIS — Z7952 Long term (current) use of systemic steroids: Secondary | ICD-10-CM | POA: Diagnosis not present

## 2020-01-13 DIAGNOSIS — K219 Gastro-esophageal reflux disease without esophagitis: Secondary | ICD-10-CM | POA: Diagnosis not present

## 2020-01-13 DIAGNOSIS — S62102D Fracture of unspecified carpal bone, left wrist, subsequent encounter for fracture with routine healing: Secondary | ICD-10-CM | POA: Diagnosis not present

## 2020-01-13 DIAGNOSIS — G2581 Restless legs syndrome: Secondary | ICD-10-CM | POA: Diagnosis not present

## 2020-01-13 DIAGNOSIS — Z515 Encounter for palliative care: Secondary | ICD-10-CM | POA: Diagnosis not present

## 2020-01-13 DIAGNOSIS — I130 Hypertensive heart and chronic kidney disease with heart failure and stage 1 through stage 4 chronic kidney disease, or unspecified chronic kidney disease: Secondary | ICD-10-CM | POA: Diagnosis not present

## 2020-01-13 DIAGNOSIS — G9009 Other idiopathic peripheral autonomic neuropathy: Secondary | ICD-10-CM | POA: Diagnosis not present

## 2020-01-13 DIAGNOSIS — Z7901 Long term (current) use of anticoagulants: Secondary | ICD-10-CM | POA: Diagnosis not present

## 2020-01-13 DIAGNOSIS — M810 Age-related osteoporosis without current pathological fracture: Secondary | ICD-10-CM | POA: Diagnosis not present

## 2020-01-13 DIAGNOSIS — E7849 Other hyperlipidemia: Secondary | ICD-10-CM | POA: Diagnosis not present

## 2020-01-13 DIAGNOSIS — I4891 Unspecified atrial fibrillation: Secondary | ICD-10-CM | POA: Diagnosis not present

## 2020-01-13 DIAGNOSIS — E559 Vitamin D deficiency, unspecified: Secondary | ICD-10-CM | POA: Diagnosis not present

## 2020-01-13 DIAGNOSIS — M1990 Unspecified osteoarthritis, unspecified site: Secondary | ICD-10-CM | POA: Diagnosis not present

## 2020-01-13 DIAGNOSIS — E538 Deficiency of other specified B group vitamins: Secondary | ICD-10-CM | POA: Diagnosis not present

## 2020-01-13 DIAGNOSIS — I872 Venous insufficiency (chronic) (peripheral): Secondary | ICD-10-CM | POA: Diagnosis not present

## 2020-01-13 DIAGNOSIS — L97513 Non-pressure chronic ulcer of other part of right foot with necrosis of muscle: Secondary | ICD-10-CM | POA: Diagnosis not present

## 2020-01-13 DIAGNOSIS — I272 Pulmonary hypertension, unspecified: Secondary | ICD-10-CM | POA: Diagnosis not present

## 2020-01-13 DIAGNOSIS — I2510 Atherosclerotic heart disease of native coronary artery without angina pectoris: Secondary | ICD-10-CM | POA: Diagnosis not present

## 2020-01-13 DIAGNOSIS — N1831 Chronic kidney disease, stage 3a: Secondary | ICD-10-CM | POA: Diagnosis not present

## 2020-01-13 DIAGNOSIS — M5136 Other intervertebral disc degeneration, lumbar region: Secondary | ICD-10-CM | POA: Diagnosis not present

## 2020-01-13 DIAGNOSIS — I739 Peripheral vascular disease, unspecified: Secondary | ICD-10-CM | POA: Diagnosis not present

## 2020-01-13 NOTE — Progress Notes (Signed)
[facility] Consult Note Telephone: [phone]  Fax: [phone]  PATIENT NAME: [name] DOB: Oct 10, 1929 MRN: [id number]  PRIMARY CARE PROVIDER:   Ria Bush, MD  REFERRING PROVIDER:  Ria Bush, MD [address] [place],  [place] [address]  RESPONSIBLE PARTY:   [name], daughter [phone]    RECOMMENDATIONS and PLAN:  1.  Advanced care planning.  Patient is DNR and has this in the home.  Patient also has advanced directives and daughter is named as Special educational needs teacher of attorney.  Advanced directives and DNR are uploaded into epic.  2.  Functional status.  Patient is able to get up and walk with walker.  After fall in July, daughter does state that she takes things more slowly and carefully.  Patient is having increased weakness and shortness of breath with activity and walking from living room to bedroom she is short of breath.  She is able to get in and out of bed independently.  She does require assistance with showering but can dress on her own.  States that her appetite is good but she is not eating as much as she used to.  Baseline weight used to be around 143 pounds and patient currently weighs about 135 pounds.  She is continent of bowel and bladder though sometimes has functional incontinence of bladder.  During the night she has been using a trash can in her room when she has to get up to urinate.  Have instructed that it would be safer and more convenient if she had a bedside commode.  Discussed ways to conserve energy, such as having extra help in the home.  Have given daughter list of agencies that she can contact for help in the home.  Also gave number for [employer] for any resources that they might be able to help with.  3.  Cognitive impairment.  Patient is alert and oriented x3.  Patient herself and daughter have noticed that she has becoming more forgetful such as leaving she is on  the counter all night after making a grilled cheese sandwich.  Daughter does state that she has at times forgotten to take her morning medications.  Daughter does come and put her medications in pillboxes for a.m. and p.m., but patient will take a.m. meds out and set on end table and will sometimes forget to take them.  Patient does have appointment next week with primary for cognitive testing.  Have recommended reaching out to agencies for in-home help that can help patient with reminders for taking her medications and making sure that she is safe while preparing meals even when light meals.  Discussed with patient and daughter to try and apply for Medicaid as this will add extra support with their personal care services if she qualifies.  Patient is having progression of disease with shortness of breath and fatigue with most activity.  Patient is also having more forgetfulness and safety is a concern.  I strongly encouraged extra help in the home as this will help with safety concerns and making sure patient is properly taking her medications and will conserve energy as patient gets very fatigued with most activities including preparing meals.  Patient does receive Meals on Wheels which helps with a good balanced meal each day that she does not have to prepare.  Palliative will continue to monitor for symptom management/decline and make recommendations as needed.  Follow-up appointment in 6 weeks.  Encouraged to call  with any questions or concerns.  I spent 90 minutes providing this consultation,  from 2:00 to 3:30 including time spent with patient/family, chart review, provider coordination, documentation. More than 50% of the time in this consultation was spent coordinating communication.   HISTORY OF PRESENT ILLNESS:  [name] is a 86 y.o. year old female with multiple medical problems including CHF, HTN, PVD with chronic ulcer of right foot, peripheral neuropathy, GERD, HLD, A. fib, RLS, CAD,  CKD D stage III. Palliative Care was asked to help address goals of care.  Patient was in ER on 07/06/2019 for chest pain and on 07/12/2019 had right and left heart catheterization with coronary angiography done.  08/02/2019 had lower extremity angiography done.  7/17 through 10/27/2019 was in the hospital for a fall with left wrist fracture.  Fracture required splinting and outpatient monitoring.  Hospitalization was complicated by acute kidney injury secondary to urinary retention and dehydration.  Patient did spend 3 weeks at [facility] for short-term rehab.  Patient lives alone in senior living apartments.  Does have someone that comes twice a month to help with cleaning.  Her daughter comes almost daily.  Patient and daughter do state that her memory is getting worse and that she has an appointment next week with primary for cognitive testing.  Patient denies pain today but daughter does state that she has pain almost daily in the right leg that has chronic ulcers.  The ulcers are being managed by podiatry and wound care RN through home health.  Patient does state that she has issues with constipation and does take MiraLAX daily.  Every now and then she does have to use an enema at home.  Has been having increased fatigue and shortness of breath with activity.  Denies headaches, chest pain, N/V/D, hematuria, dysuria.  CODE STATUS: DNR  PPS: 60% HOSPICE ELIGIBILITY/DIAGNOSIS: TBD  PHYSICAL EXAM:  BP 114/64 HR 55 O2 98% on room air General: NAD, frail appearing, thin Cardiovascular: regular rate and rhythm Pulmonary: Lung sounds clear; normal respiratory effort Abdomen: soft, nontender, + bowel sounds GU: no suprapubic tenderness Extremities: no edema, no joint deformities Skin: Patient's feet are purplish red and cool to touch due to PVD.  Patient has chronic ulcer to right leg which is bandaged and being followed by home health wound care Neurological: Weakness but otherwise nonfocal; A&O  x3; is having more forgetfulness   PAST MEDICAL HISTORY:  Past Medical History:  Diagnosis Date  . Abdominal aortic atherosclerosis ([facility]) 09/2015   by xray  . Anxiety   . BCC (basal cell carcinoma of skin) 05/2011   on nose  . Breast lesion    a. diagnosed with complex sclerosing lesion with calcifications of the right breast in 08/2016  . Chest pain    a. nuclear stress test 03/2012: normal; b. 09/2018 MV: EF >65%, no isch/scar.   . DDD (degenerative disc disease), lumbosacral 12/06/1992  . Gallstones 09/2015   incidentally by xray  . GERD (gastroesophageal reflux disease) 05/1999  . HERPES ZOSTER 04/13/2007   Qualifier: Diagnosis of  By: [name], [name]   . Hiatal hernia   . History of shingles    ophthalmic, takes acyclovir daily  . Hyperlipidemia 12/2003  . Hypertension 1990  . Mitral regurgitation    a. echo 03/2012: EF 60-65%, no RWMA, mild to mod AI/MR, mod TR, PASP 37 mmHg  . Osteoarthritis 08/21/1989  . Osteoporosis with fracture 11/1997   with compression fracture T12  .  Persistent atrial fibrillation ([facility]) 03/16/2012   a. CHADS2VASc => 5 (HTN, age x 2, vascular disease, sex category)-->Eliquis 5 BID;  b. Recurrent AF in 06/2016;  c. 01/2017 s/p DCCV;  d. 02/2017 recurrent AFib->Amio added->DCCV; e. 03/2017 recurrent AFib, s/p reload of amio and DCCV 04/02/2017  . Rheumatoid arthritis ([facility])   . RLS (restless legs syndrome)     SOCIAL HX:  Social History   Tobacco Use  . Smoking status: Former Smoker    Packs/day: 0.25    Years: 1.00    Pack years: 0.25    Quit date: 04/08/1976    Years since quitting: 43.7  . Smokeless tobacco: Never Used  . Tobacco comment: pt smokes occasionally "socially"  Substance Use Topics  . Alcohol use: No    Alcohol/week: 1.0 standard drink    Types: 1 Glasses of wine per week    ALLERGIES:  Allergies  Allergen Reactions  . Penicillins Rash and Other (See Comments)    Childhood Allergy Has patient had a PCN reaction  causing immediate rash, facial/tongue/throat swelling, SOB or lightheadedness with hypotension: Yes Has patient had a PCN reaction causing severe rash involving mucus membranes or skin necrosis: Unknown Has patient had a PCN reaction that required hospitalization: No Has patient had a PCN reaction occurring within the last 10 years: No If all of the above answers are "NO", then may proceed with Cephalosporin use.      PERTINENT MEDICATIONS:  Outpatient Encounter Medications as of 01/13/2020  Medication Sig  . acetaminophen (TYLENOL) 500 MG tablet Take 1,000 mg every 8 (eight) hours as needed by mouth for moderate pain.   Marland Kitchen acyclovir (ZOVIRAX) 400 MG tablet Take 400 mg 2 (two) times daily by mouth.   Marland Kitchen amiodarone (PACERONE) 100 MG tablet Take 1 tablet (100 mg total) by mouth daily.  Marland Kitchen aspirin EC 81 MG tablet Take 1 tablet (81 mg total) by mouth daily.  Marland Kitchen atorvastatin (LIPITOR) 20 MG tablet TAKE 1 TABLET BY MOUTH ONCE DAILY (Patient taking differently: Take 20 mg by mouth daily. )  . collagenase (SANTYL) ointment Apply 1 application topically daily.  . DULoxetine (CYMBALTA) 60 MG capsule TAKE 1 CAPSULE BY MOUTH ONCE DAILY (Patient taking differently: Take 60 mg by mouth daily. )  . ELIQUIS 2.5 MG TABS tablet TAKE 1 TABLET BY MOUTH TWICE DAILY (Patient taking differently: Take 2.5 mg by mouth 2 (two) times daily. )  . ergocalciferol (VITAMIN D2) 1.25 MG (50000 UT) capsule Take 1 capsule (50,000 Units total) by mouth once a week. (Patient taking differently: Take 50,000 Units by mouth once a week. Saturday)  . gabapentin (NEURONTIN) 300 MG capsule Take 1 capsule (300 mg total) by mouth at bedtime.  . metoprolol tartrate (LOPRESSOR) 25 MG tablet TAKE 1/2 TABLET BY MOUTH TWICE DAILY  . pantoprazole (PROTONIX) 40 MG tablet TAKE 1 TABLET BY MOUTH EVERY DAY (Patient taking differently: Take 40 mg by mouth daily. )  . polyethylene glycol (MIRALAX / GLYCOLAX) 17 g packet Take 17 g by mouth daily as needed  for mild constipation.  . prednisoLONE acetate (PRED FORTE) 1 % ophthalmic suspension Place 1 drop into the left eye daily.   Marland Kitchen rOPINIRole (REQUIP) 1 MG tablet Take 2.5 tablets (2.5 mg total) by mouth at bedtime.  . traMADol (ULTRAM) 50 MG tablet Take 50 mg by mouth daily. Takes 2 tablets daily   No facility-administered encounter medications on file as of 01/13/2020.     Iridiana Fonner Jenetta Downer, NP

## 2020-01-13 NOTE — Telephone Encounter (Signed)
Refilled as requested  

## 2020-01-13 NOTE — Telephone Encounter (Signed)
Please advise if ok to refill metoprolol tartrate 25 mg tablet 0.5 mg bid. Last filled by PCP.

## 2020-01-17 DIAGNOSIS — I872 Venous insufficiency (chronic) (peripheral): Secondary | ICD-10-CM | POA: Diagnosis not present

## 2020-01-18 DIAGNOSIS — E559 Vitamin D deficiency, unspecified: Secondary | ICD-10-CM | POA: Diagnosis not present

## 2020-01-18 DIAGNOSIS — I872 Venous insufficiency (chronic) (peripheral): Secondary | ICD-10-CM | POA: Diagnosis not present

## 2020-01-18 DIAGNOSIS — K219 Gastro-esophageal reflux disease without esophagitis: Secondary | ICD-10-CM | POA: Diagnosis not present

## 2020-01-18 DIAGNOSIS — S62102D Fracture of unspecified carpal bone, left wrist, subsequent encounter for fracture with routine healing: Secondary | ICD-10-CM | POA: Diagnosis not present

## 2020-01-18 DIAGNOSIS — Z7952 Long term (current) use of systemic steroids: Secondary | ICD-10-CM | POA: Diagnosis not present

## 2020-01-18 DIAGNOSIS — G2581 Restless legs syndrome: Secondary | ICD-10-CM | POA: Diagnosis not present

## 2020-01-18 DIAGNOSIS — G9009 Other idiopathic peripheral autonomic neuropathy: Secondary | ICD-10-CM | POA: Diagnosis not present

## 2020-01-18 DIAGNOSIS — I2510 Atherosclerotic heart disease of native coronary artery without angina pectoris: Secondary | ICD-10-CM | POA: Diagnosis not present

## 2020-01-18 DIAGNOSIS — Z7901 Long term (current) use of anticoagulants: Secondary | ICD-10-CM | POA: Diagnosis not present

## 2020-01-18 DIAGNOSIS — Z7982 Long term (current) use of aspirin: Secondary | ICD-10-CM | POA: Diagnosis not present

## 2020-01-18 DIAGNOSIS — I130 Hypertensive heart and chronic kidney disease with heart failure and stage 1 through stage 4 chronic kidney disease, or unspecified chronic kidney disease: Secondary | ICD-10-CM | POA: Diagnosis not present

## 2020-01-18 DIAGNOSIS — I4891 Unspecified atrial fibrillation: Secondary | ICD-10-CM | POA: Diagnosis not present

## 2020-01-18 DIAGNOSIS — N1831 Chronic kidney disease, stage 3a: Secondary | ICD-10-CM | POA: Diagnosis not present

## 2020-01-18 DIAGNOSIS — E7849 Other hyperlipidemia: Secondary | ICD-10-CM | POA: Diagnosis not present

## 2020-01-18 DIAGNOSIS — I10 Essential (primary) hypertension: Secondary | ICD-10-CM | POA: Diagnosis not present

## 2020-01-18 DIAGNOSIS — Z9181 History of falling: Secondary | ICD-10-CM | POA: Diagnosis not present

## 2020-01-18 DIAGNOSIS — I5032 Chronic diastolic (congestive) heart failure: Secondary | ICD-10-CM | POA: Diagnosis not present

## 2020-01-18 DIAGNOSIS — M810 Age-related osteoporosis without current pathological fracture: Secondary | ICD-10-CM | POA: Diagnosis not present

## 2020-01-18 DIAGNOSIS — M1990 Unspecified osteoarthritis, unspecified site: Secondary | ICD-10-CM | POA: Diagnosis not present

## 2020-01-18 DIAGNOSIS — I272 Pulmonary hypertension, unspecified: Secondary | ICD-10-CM | POA: Diagnosis not present

## 2020-01-18 DIAGNOSIS — M5136 Other intervertebral disc degeneration, lumbar region: Secondary | ICD-10-CM | POA: Diagnosis not present

## 2020-01-18 DIAGNOSIS — E538 Deficiency of other specified B group vitamins: Secondary | ICD-10-CM | POA: Diagnosis not present

## 2020-01-18 DIAGNOSIS — L97513 Non-pressure chronic ulcer of other part of right foot with necrosis of muscle: Secondary | ICD-10-CM | POA: Diagnosis not present

## 2020-01-18 DIAGNOSIS — I739 Peripheral vascular disease, unspecified: Secondary | ICD-10-CM | POA: Diagnosis not present

## 2020-01-19 ENCOUNTER — Encounter: Payer: Self-pay | Admitting: Family Medicine

## 2020-01-19 ENCOUNTER — Ambulatory Visit (INDEPENDENT_AMBULATORY_CARE_PROVIDER_SITE_OTHER): Payer: Medicare Other | Admitting: Family Medicine

## 2020-01-19 ENCOUNTER — Other Ambulatory Visit: Payer: Self-pay

## 2020-01-19 ENCOUNTER — Ambulatory Visit (INDEPENDENT_AMBULATORY_CARE_PROVIDER_SITE_OTHER)
Admission: RE | Admit: 2020-01-19 | Discharge: 2020-01-19 | Disposition: A | Discharge status: Home / Self Care | Payer: Medicare Other | Source: Ambulatory Visit | Attending: Family Medicine | Admitting: Family Medicine

## 2020-01-19 VITALS — BP 140/90 | HR 63 | Temp 97.8°F | Ht 62.0 in | Wt 133.4 lb

## 2020-01-19 DIAGNOSIS — W19XXXA Unspecified fall, initial encounter: Secondary | ICD-10-CM

## 2020-01-19 DIAGNOSIS — M79604 Pain in right leg: Secondary | ICD-10-CM | POA: Diagnosis not present

## 2020-01-19 DIAGNOSIS — G3184 Mild cognitive impairment, so stated: Secondary | ICD-10-CM

## 2020-01-19 DIAGNOSIS — M79671 Pain in right foot: Secondary | ICD-10-CM

## 2020-01-19 DIAGNOSIS — I4819 Other persistent atrial fibrillation: Secondary | ICD-10-CM | POA: Diagnosis not present

## 2020-01-19 DIAGNOSIS — G6289 Other specified polyneuropathies: Secondary | ICD-10-CM

## 2020-01-19 DIAGNOSIS — Z515 Encounter for palliative care: Secondary | ICD-10-CM

## 2020-01-19 DIAGNOSIS — M79605 Pain in left leg: Secondary | ICD-10-CM

## 2020-01-19 DIAGNOSIS — R2681 Unsteadiness on feet: Secondary | ICD-10-CM

## 2020-01-19 DIAGNOSIS — K449 Diaphragmatic hernia without obstruction or gangrene: Secondary | ICD-10-CM | POA: Diagnosis not present

## 2020-01-19 DIAGNOSIS — M79672 Pain in left foot: Secondary | ICD-10-CM

## 2020-01-19 DIAGNOSIS — L97519 Non-pressure chronic ulcer of other part of right foot with unspecified severity: Secondary | ICD-10-CM

## 2020-01-19 DIAGNOSIS — G8929 Other chronic pain: Secondary | ICD-10-CM

## 2020-01-19 DIAGNOSIS — M549 Dorsalgia, unspecified: Secondary | ICD-10-CM | POA: Diagnosis not present

## 2020-01-19 DIAGNOSIS — I739 Peripheral vascular disease, unspecified: Secondary | ICD-10-CM

## 2020-01-19 DIAGNOSIS — Z23 Encounter for immunization: Secondary | ICD-10-CM

## 2020-01-19 MED ORDER — MEMANTINE HCL 5 MG PO TABS
5.0000 mg | ORAL_TABLET | Freq: Two times a day (BID) | ORAL | 1 refills | Status: DC
Start: 2020-01-19 — End: 2020-03-14

## 2020-01-19 NOTE — Progress Notes (Signed)
This visit was conducted in person.  BP 140/90 (BP Location: Right Arm, Patient Position: Sitting, Cuff Size: Normal)   Pulse 63   Temp 97.8 F (36.6 C) (Temporal)   Ht 5\' 2"  (1.575 m)   Wt 133 lb 6 oz (60.5 kg)   SpO2 95%   BMI 24.39 kg/m   BP adequate   CC: 3 wk f/u visit  Subjective:    Patient ID: [name], female    DOB: March 07, 1930, 86 y.o.   MRN: [id number]  HPI: [name] is a 86 y.o. female presenting on 01/19/2020 for Memory Loss (Here for 3 wk memory f/u and geriatric assessment.  Pt accompanied by, [name]- temp 97.9.)   See prior note for details.  Ongoing memory trouble noted by pt/daughter.  [facility] care has established with patient, last week's note reviewed.  Calls are out to local personal care services for in home care.  She does receive meals on wheels.   Had a fall this past week. Fell backwards while standing in bedroom, hit L arm and L back on metal dresser and scraped upper posterior arm. Did have some dizziness prior to fall. Pain more noticeable to R back with deep breath.   Saw [facility] ([facility]) last week - continues tylenol, gabapentin 300mg  bid, tramadol PRN through their office. Latest received bilateral superior gluteal trigger point injections.   Ongoing bilateral foot pain from neuropathy on gabapentin.  Notes progressive dyspnea  Upcoming appt with Dr Fletcher Anon 02/01/2020.  Notes early satiety.    Geriatric Assessment: Activities of Daily Living:     Bathing- dependent     Dressing- independent     Eating- independent     Toileting- independent (in process of getting bedside commode)    Transferring- partially dependent - has bed railing    Continence- independent  Overall Assessment: largely independent   Instrumental Activities of Daily Living:     Transportation- dependent     Meal/Food Preparation- independent     Shopping Errands- dependent     Housekeeping/Chores- dependent     Money  Management/Finances- dependent     Medication Management- dependent     Ability to Use Telephone- independent     Laundry- independent  Overall Assessment: largely dependent  Mental Status Exam: 24-25/30 (value/max value)     Clock Drawing Score: 2/4     Relevant past medical, surgical, family and social history reviewed and updated as indicated. Interim medical history since our last visit reviewed. Allergies and medications reviewed and updated. Outpatient Medications Prior to Visit  Medication Sig Dispense Refill  . acetaminophen (TYLENOL) 500 MG tablet Take 1,000 mg every 8 (eight) hours as needed by mouth for moderate pain.     Marland Kitchen acyclovir (ZOVIRAX) 400 MG tablet Take 400 mg 2 (two) times daily by mouth.     Marland Kitchen amiodarone (PACERONE) 100 MG tablet Take 1 tablet (100 mg total) by mouth daily.    Marland Kitchen aspirin EC 81 MG tablet Take 1 tablet (81 mg total) by mouth daily. 150 tablet 2  . atorvastatin (LIPITOR) 20 MG tablet TAKE 1 TABLET BY MOUTH ONCE DAILY (Patient taking differently: Take 20 mg by mouth daily. ) 90 tablet 1  . collagenase (SANTYL) ointment Apply 1 application topically daily. 15 g 0  . DULoxetine (CYMBALTA) 60 MG capsule TAKE 1 CAPSULE BY MOUTH ONCE DAILY (Patient taking differently: Take 60 mg by mouth daily. ) 90 capsule 1  . ELIQUIS 2.5 MG TABS tablet  TAKE 1 TABLET BY MOUTH TWICE DAILY (Patient taking differently: Take 2.5 mg by mouth 2 (two) times daily. ) 180 tablet 1  . ergocalciferol (VITAMIN D2) 1.25 MG (50000 UT) capsule Take 1 capsule (50,000 Units total) by mouth once a week. (Patient taking differently: Take 50,000 Units by mouth once a week. Saturday) 12 capsule 3  . gabapentin (NEURONTIN) 300 MG capsule Take 1 capsule (300 mg total) by mouth at bedtime.    . metoprolol tartrate (LOPRESSOR) 25 MG tablet TAKE 1/2 TABLET BY MOUTH TWICE DAILY 90 tablet 3  . pantoprazole (PROTONIX) 40 MG tablet TAKE 1 TABLET BY MOUTH EVERY DAY (Patient taking differently: Take 40 mg by  mouth daily. ) 90 tablet 1  . polyethylene glycol (MIRALAX / GLYCOLAX) 17 g packet Take 17 g by mouth daily as needed for mild constipation. 14 each 0  . prednisoLONE acetate (PRED FORTE) 1 % ophthalmic suspension Place 1 drop into the left eye daily.     Marland Kitchen rOPINIRole (REQUIP) 1 MG tablet Take 2.5 tablets (2.5 mg total) by mouth at bedtime.    . traMADol (ULTRAM) 50 MG tablet Take 50 mg by mouth daily. Takes 2 tablets daily  0  . sulfamethoxazole-trimethoprim (BACTRIM DS) 800-160 MG tablet Take 1 tablet by mouth 2 (two) times daily.     No facility-administered medications prior to visit.     Per HPI unless specifically indicated in ROS section below Review of Systems Objective:  BP 140/90 (BP Location: Right Arm, Patient Position: Sitting, Cuff Size: Normal)   Pulse 63   Temp 97.8 F (36.6 C) (Temporal)   Ht 5\' 2"  (1.575 m)   Wt 133 lb 6 oz (60.5 kg)   SpO2 95%   BMI 24.39 kg/m   Wt Readings from Last 3 Encounters:  01/19/20 133 lb 6 oz (60.5 kg)  12/27/19 136 lb 2 oz (61.7 kg)  11/23/19 138 lb (62.6 kg)      Physical Exam Vitals and nursing note reviewed.  Constitutional:      Appearance: Normal appearance.     Comments: Uses rolling walker  Eyes:     Extraocular Movements: Extraocular movements intact.     Pupils: Pupils are equal, round, and reactive to light.  Cardiovascular:     Rate and Rhythm: Normal rate and regular rhythm.     Pulses: Normal pulses.     Heart sounds: Normal heart sounds. No murmur heard.   Pulmonary:     Effort: Pulmonary effort is normal. No respiratory distress.     Breath sounds: Normal breath sounds. No wheezing, rhonchi or rales.       Comments: Reproducible tenderness to palpation R posterior lower ribcage Chest:     Chest wall: Tenderness present.  Musculoskeletal:     Right lower leg: No edema.     Left lower leg: No edema.  Skin:    Findings: Bruising and wound present.     Comments: Bruising to L upper arm after recent fall  with skin tear to posterior upper arm, bandaged   Neurological:     Mental Status: She is alert.  Psychiatric:        Mood and Affect: Mood normal.        Behavior: Behavior normal.       Results for orders placed or performed in visit on 12/27/19  Renal function panel  Result Value Ref Range   Sodium 139 135 - 145 mEq/L   Potassium 4.0 3.5 - 5.1 mEq/L  Chloride 101 96 - 112 mEq/L   CO2 28 19 - 32 mEq/L   Albumin 3.9 3.5 - 5.2 g/dL   BUN 15 6 - 23 mg/dL   Creatinine, Ser 1.07 0.40 - 1.20 mg/dL   Glucose, Bld 89 70 - 99 mg/dL   Phosphorus 3.6 2.3 - 4.6 mg/dL   GFR 48.33 (L) >60.00 mL/min   Calcium 9.2 8.4 - 10.5 mg/dL   Assessment & Plan:  This visit occurred during the SARS-CoV-2 public health emergency.  Safety protocols were in place, including screening questions prior to the visit, additional usage of staff PPE, and extensive cleaning of exam room while observing appropriate contact time as indicated for disinfecting solutions.   Problem List Items Addressed This Visit    Ulcer of right foot ([place])    Continues HH wound therapy and wound clinic.  Wound healing is making progress.  Currently on extended bactrim course through wound clinic.       Persistent atrial fibrillation (HCC)    Continues aspirin and low dose eliquis.       Peripheral neuropathy    Continues gabapentin 300mg  bid - consider decreasing dose given ongoing unsteadiness. Continues tramadol as well through [facility].       Relevant Medications   memantine (NAMENDA) 5 MG tablet   Palliative care by specialist    Recently established with palliative care. Appreciate [facility] care.       PAD (peripheral artery disease) (HCC)   MCI (mild cognitive impairment) with memory loss - Primary    With possible early stages of dementia - discussed with patient. Will trial namenda 18m bid. Avoid aricept at this time.  Reviewed lifestyle changes including importance of good nutrition, mind use (reading, word  puzzles, etc), physical activity as safely able, and social engagement.  Reassess at 6 wk f/u visit.  MMSE 24/30, 25 with cue (01/2020). Missed 3 orientation, 1 recall, 2 language.       General unsteadiness    Recurrent falls with injury.  Latest PT fall prevention program 01/2018, has seen HHPT in interim.  Would be candidate for HHPT re eval given ongoing falls - but will defer at this time, consider in f/u.       Fall with injury    Recent fall with injury - residual R posterior ribcage pain (check xray r/o fracture) and L upper arm skin tear. Support provided. Encouraged constant rollator use.       Relevant Orders   DG Chest 2 View   Chronic leg pain    Ongoing, continues leg compression wraps twice weekly, R foot wound continues healing well.       Bilateral leg and foot pain    Other Visit Diagnoses    Need for influenza vaccination       Relevant Orders   Flu Vaccine QUAD High Dose(Fluad) (Completed)       Meds ordered this encounter  Medications  . memantine (NAMENDA) 5 MG tablet    Sig: Take 1 tablet (5 mg total) by mouth 2 (two) times daily.    Dispense:  60 tablet    Refill:  1   Orders Placed This Encounter  Procedures  . DG Chest 2 View    Standing Status:   Future    Number of Occurrences:   1    Standing Expiration Date:   01/18/2021    Order Specific Question:   Reason for Exam (SYMPTOM  OR DIAGNOSIS REQUIRED)    Answer:  back pain after fall    Order Specific Question:   Preferred imaging location?    Answer:   [facility]  . Flu Vaccine QUAD High Dose(Fluad)    Patient Instructions  Flu shot today  Memory testing was somewhat abnormal.  Let's trial namenda 5mg  twice daily.  Return in 1 month for follow up visit.  We will discuss possible repeat PT referral at next visit.  Chest xray today   Follow up plan: Return in about 4 weeks (around 02/16/2020) for follow up visit.  Ria Bush, MD

## 2020-01-19 NOTE — Patient Instructions (Addendum)
Flu shot today  Memory testing was somewhat abnormal.  Let's trial namenda 5mg  twice daily.  Return in 1 month for follow up visit.  We will discuss possible repeat PT referral at next visit.  Chest xray today

## 2020-01-20 ENCOUNTER — Telehealth: Payer: Self-pay

## 2020-01-20 ENCOUNTER — Encounter: Payer: Self-pay | Admitting: Family Medicine

## 2020-01-20 ENCOUNTER — Telehealth: Payer: Self-pay | Admitting: Adult Health Nurse Practitioner

## 2020-01-20 NOTE — Assessment & Plan Note (Addendum)
Continues HH wound therapy and wound clinic.  Wound healing is making progress.  Currently on extended bactrim course through wound clinic.

## 2020-01-20 NOTE — Assessment & Plan Note (Signed)
Continues aspirin and low dose eliquis.  

## 2020-01-20 NOTE — Telephone Encounter (Signed)
(  3:15p) Per request of NP-Amy Olena Heckle, SW completed follow-up call to patient's PCG/daughter-Patricia. Patient has had a recent fall (Monday), but had a few falls in the past few weeks. Patient  is needing more assistance with personal care needs, taking medications and is more forgetful. Patient currently reside in an independent apartment with daily contact with family members. She is receiving personal care services, nursing and wound care through [facility]. SW provided education regarding PCS services, medicaid and local personal care services that are available through private pay. SW reinforced education provided by the NP regarding disease progression and long-term care needs that may persist. PCG will follow-up on education/resources discussed. SW normalized PCG's feelings and anxiety about changes in patient's condition and the pressure of making sure her needs are being met. SW provided emotional support,  empowerment and reassurance of ongoing support. *NP-Amy Olena Heckle was updated for follow-up.

## 2020-01-20 NOTE — Assessment & Plan Note (Signed)
Recurrent falls with injury.  Latest PT fall prevention program 01/2018, has seen HHPT in interim.  Would be candidate for HHPT re eval given ongoing falls - but will defer at this time, consider in f/u.

## 2020-01-20 NOTE — Assessment & Plan Note (Signed)
Ongoing, continues leg compression wraps twice weekly, R foot wound continues healing well.

## 2020-01-20 NOTE — Assessment & Plan Note (Signed)
Recent fall with injury - residual R posterior ribcage pain (check xray r/o fracture) and L upper arm skin tear. Support provided. Encouraged constant rollator use.

## 2020-01-20 NOTE — Assessment & Plan Note (Addendum)
Recently established with palliative care. Appreciate [facility] care.

## 2020-01-20 NOTE — Telephone Encounter (Signed)
Daughter had questions about services that insurance covered and did not cover.  Attempted to answer her questions.  Will have SW give her a call to clarify any questions I could not help her with.  Have advised her to also contact [place]. Olena Heckle NP

## 2020-01-20 NOTE — Assessment & Plan Note (Addendum)
With possible early stages of dementia - discussed with patient. Will trial namenda 39m bid. Avoid aricept at this time.  Reviewed lifestyle changes including importance of good nutrition, mind use (reading, word puzzles, etc), physical activity as safely able, and social engagement.  Reassess at 6 wk f/u visit.  MMSE 24/30, 25 with cue (01/2020). Missed 3 orientation, 1 recall, 2 language.

## 2020-01-20 NOTE — Assessment & Plan Note (Signed)
Continues gabapentin 300mg  bid - consider decreasing dose given ongoing unsteadiness. Continues tramadol as well through [facility].

## 2020-01-21 ENCOUNTER — Telehealth: Payer: Self-pay

## 2020-01-21 DIAGNOSIS — I4891 Unspecified atrial fibrillation: Secondary | ICD-10-CM | POA: Diagnosis not present

## 2020-01-21 DIAGNOSIS — K219 Gastro-esophageal reflux disease without esophagitis: Secondary | ICD-10-CM | POA: Diagnosis not present

## 2020-01-21 DIAGNOSIS — I739 Peripheral vascular disease, unspecified: Secondary | ICD-10-CM | POA: Diagnosis not present

## 2020-01-21 DIAGNOSIS — N1831 Chronic kidney disease, stage 3a: Secondary | ICD-10-CM | POA: Diagnosis not present

## 2020-01-21 DIAGNOSIS — Z7982 Long term (current) use of aspirin: Secondary | ICD-10-CM | POA: Diagnosis not present

## 2020-01-21 DIAGNOSIS — I130 Hypertensive heart and chronic kidney disease with heart failure and stage 1 through stage 4 chronic kidney disease, or unspecified chronic kidney disease: Secondary | ICD-10-CM | POA: Diagnosis not present

## 2020-01-21 DIAGNOSIS — F419 Anxiety disorder, unspecified: Secondary | ICD-10-CM

## 2020-01-21 DIAGNOSIS — M5136 Other intervertebral disc degeneration, lumbar region: Secondary | ICD-10-CM | POA: Diagnosis not present

## 2020-01-21 DIAGNOSIS — I10 Essential (primary) hypertension: Secondary | ICD-10-CM | POA: Diagnosis not present

## 2020-01-21 DIAGNOSIS — E7849 Other hyperlipidemia: Secondary | ICD-10-CM | POA: Diagnosis not present

## 2020-01-21 DIAGNOSIS — E538 Deficiency of other specified B group vitamins: Secondary | ICD-10-CM | POA: Diagnosis not present

## 2020-01-21 DIAGNOSIS — Z7952 Long term (current) use of systemic steroids: Secondary | ICD-10-CM | POA: Diagnosis not present

## 2020-01-21 DIAGNOSIS — S62102D Fracture of unspecified carpal bone, left wrist, subsequent encounter for fracture with routine healing: Secondary | ICD-10-CM | POA: Diagnosis not present

## 2020-01-21 DIAGNOSIS — M1990 Unspecified osteoarthritis, unspecified site: Secondary | ICD-10-CM | POA: Diagnosis not present

## 2020-01-21 DIAGNOSIS — Z7901 Long term (current) use of anticoagulants: Secondary | ICD-10-CM | POA: Diagnosis not present

## 2020-01-21 DIAGNOSIS — G2581 Restless legs syndrome: Secondary | ICD-10-CM | POA: Diagnosis not present

## 2020-01-21 DIAGNOSIS — I110 Hypertensive heart disease with heart failure: Secondary | ICD-10-CM

## 2020-01-21 DIAGNOSIS — I2510 Atherosclerotic heart disease of native coronary artery without angina pectoris: Secondary | ICD-10-CM | POA: Diagnosis not present

## 2020-01-21 DIAGNOSIS — I5032 Chronic diastolic (congestive) heart failure: Secondary | ICD-10-CM | POA: Diagnosis not present

## 2020-01-21 DIAGNOSIS — M810 Age-related osteoporosis without current pathological fracture: Secondary | ICD-10-CM | POA: Diagnosis not present

## 2020-01-21 DIAGNOSIS — I272 Pulmonary hypertension, unspecified: Secondary | ICD-10-CM | POA: Diagnosis not present

## 2020-01-21 DIAGNOSIS — Z9181 History of falling: Secondary | ICD-10-CM | POA: Diagnosis not present

## 2020-01-21 DIAGNOSIS — L97513 Non-pressure chronic ulcer of other part of right foot with necrosis of muscle: Secondary | ICD-10-CM | POA: Diagnosis not present

## 2020-01-21 DIAGNOSIS — I872 Venous insufficiency (chronic) (peripheral): Secondary | ICD-10-CM | POA: Diagnosis not present

## 2020-01-21 DIAGNOSIS — E559 Vitamin D deficiency, unspecified: Secondary | ICD-10-CM | POA: Diagnosis not present

## 2020-01-21 DIAGNOSIS — G9009 Other idiopathic peripheral autonomic neuropathy: Secondary | ICD-10-CM | POA: Diagnosis not present

## 2020-01-21 NOTE — Telephone Encounter (Signed)
Signed and in [name]'s box.

## 2020-01-21 NOTE — Telephone Encounter (Signed)
Received faxed Detailed Written Order for wound care.  Placed order in Dr. Synthia Innocent box.

## 2020-01-21 NOTE — Telephone Encounter (Signed)
Faxed order

## 2020-01-25 ENCOUNTER — Telehealth: Payer: Self-pay | Admitting: Family Medicine

## 2020-01-25 DIAGNOSIS — S62102D Fracture of unspecified carpal bone, left wrist, subsequent encounter for fracture with routine healing: Secondary | ICD-10-CM | POA: Diagnosis not present

## 2020-01-25 DIAGNOSIS — M5136 Other intervertebral disc degeneration, lumbar region: Secondary | ICD-10-CM | POA: Diagnosis not present

## 2020-01-25 DIAGNOSIS — I2510 Atherosclerotic heart disease of native coronary artery without angina pectoris: Secondary | ICD-10-CM | POA: Diagnosis not present

## 2020-01-25 DIAGNOSIS — Z9181 History of falling: Secondary | ICD-10-CM | POA: Diagnosis not present

## 2020-01-25 DIAGNOSIS — L97513 Non-pressure chronic ulcer of other part of right foot with necrosis of muscle: Secondary | ICD-10-CM | POA: Diagnosis not present

## 2020-01-25 DIAGNOSIS — Z7982 Long term (current) use of aspirin: Secondary | ICD-10-CM | POA: Diagnosis not present

## 2020-01-25 DIAGNOSIS — G9009 Other idiopathic peripheral autonomic neuropathy: Secondary | ICD-10-CM | POA: Diagnosis not present

## 2020-01-25 DIAGNOSIS — M810 Age-related osteoporosis without current pathological fracture: Secondary | ICD-10-CM | POA: Diagnosis not present

## 2020-01-25 DIAGNOSIS — E538 Deficiency of other specified B group vitamins: Secondary | ICD-10-CM | POA: Diagnosis not present

## 2020-01-25 DIAGNOSIS — I130 Hypertensive heart and chronic kidney disease with heart failure and stage 1 through stage 4 chronic kidney disease, or unspecified chronic kidney disease: Secondary | ICD-10-CM | POA: Diagnosis not present

## 2020-01-25 DIAGNOSIS — I5032 Chronic diastolic (congestive) heart failure: Secondary | ICD-10-CM | POA: Diagnosis not present

## 2020-01-25 DIAGNOSIS — E7849 Other hyperlipidemia: Secondary | ICD-10-CM | POA: Diagnosis not present

## 2020-01-25 DIAGNOSIS — I739 Peripheral vascular disease, unspecified: Secondary | ICD-10-CM | POA: Diagnosis not present

## 2020-01-25 DIAGNOSIS — I10 Essential (primary) hypertension: Secondary | ICD-10-CM | POA: Diagnosis not present

## 2020-01-25 DIAGNOSIS — G2581 Restless legs syndrome: Secondary | ICD-10-CM | POA: Diagnosis not present

## 2020-01-25 DIAGNOSIS — N1831 Chronic kidney disease, stage 3a: Secondary | ICD-10-CM | POA: Diagnosis not present

## 2020-01-25 DIAGNOSIS — E559 Vitamin D deficiency, unspecified: Secondary | ICD-10-CM | POA: Diagnosis not present

## 2020-01-25 DIAGNOSIS — I4891 Unspecified atrial fibrillation: Secondary | ICD-10-CM | POA: Diagnosis not present

## 2020-01-25 DIAGNOSIS — Z7901 Long term (current) use of anticoagulants: Secondary | ICD-10-CM | POA: Diagnosis not present

## 2020-01-25 DIAGNOSIS — I272 Pulmonary hypertension, unspecified: Secondary | ICD-10-CM | POA: Diagnosis not present

## 2020-01-25 DIAGNOSIS — I872 Venous insufficiency (chronic) (peripheral): Secondary | ICD-10-CM | POA: Diagnosis not present

## 2020-01-25 DIAGNOSIS — K219 Gastro-esophageal reflux disease without esophagitis: Secondary | ICD-10-CM | POA: Diagnosis not present

## 2020-01-25 DIAGNOSIS — M1990 Unspecified osteoarthritis, unspecified site: Secondary | ICD-10-CM | POA: Diagnosis not present

## 2020-01-25 DIAGNOSIS — Z7952 Long term (current) use of systemic steroids: Secondary | ICD-10-CM | POA: Diagnosis not present

## 2020-01-25 NOTE — Telephone Encounter (Signed)
Plz call.

## 2020-01-25 NOTE — Telephone Encounter (Signed)
Agree with this. Thank you.  

## 2020-01-25 NOTE — Telephone Encounter (Signed)
[name] from [facility] reports she has dressed pt's skin tear with NS and aquacel and it is doing better with less drainage and dead tissue. The aquacel is helping remove a lot of the dead tissue. She also reports there is still a lot of bruising on the L shoulder and arm but nothing new in that are.  Per Dr. Neal Dy can continue with using the aquacel and f/u with updates. Victorino Sparrow verbal order and Otila Kluver verbalized understanding.

## 2020-01-25 NOTE — Telephone Encounter (Signed)
Please call Ms. [name] in ref to new orders for pt as well as pt's progression.  She needs call back by end of day [phone]  Thank you!

## 2020-01-27 DIAGNOSIS — I5032 Chronic diastolic (congestive) heart failure: Secondary | ICD-10-CM | POA: Diagnosis not present

## 2020-01-27 DIAGNOSIS — I4891 Unspecified atrial fibrillation: Secondary | ICD-10-CM | POA: Diagnosis not present

## 2020-01-27 DIAGNOSIS — M810 Age-related osteoporosis without current pathological fracture: Secondary | ICD-10-CM | POA: Diagnosis not present

## 2020-01-27 DIAGNOSIS — E7849 Other hyperlipidemia: Secondary | ICD-10-CM | POA: Diagnosis not present

## 2020-01-27 DIAGNOSIS — L97513 Non-pressure chronic ulcer of other part of right foot with necrosis of muscle: Secondary | ICD-10-CM | POA: Diagnosis not present

## 2020-01-27 DIAGNOSIS — I130 Hypertensive heart and chronic kidney disease with heart failure and stage 1 through stage 4 chronic kidney disease, or unspecified chronic kidney disease: Secondary | ICD-10-CM | POA: Diagnosis not present

## 2020-01-27 DIAGNOSIS — Z7952 Long term (current) use of systemic steroids: Secondary | ICD-10-CM | POA: Diagnosis not present

## 2020-01-27 DIAGNOSIS — Z7901 Long term (current) use of anticoagulants: Secondary | ICD-10-CM | POA: Diagnosis not present

## 2020-01-27 DIAGNOSIS — I872 Venous insufficiency (chronic) (peripheral): Secondary | ICD-10-CM | POA: Diagnosis not present

## 2020-01-27 DIAGNOSIS — M1990 Unspecified osteoarthritis, unspecified site: Secondary | ICD-10-CM | POA: Diagnosis not present

## 2020-01-27 DIAGNOSIS — S62102D Fracture of unspecified carpal bone, left wrist, subsequent encounter for fracture with routine healing: Secondary | ICD-10-CM | POA: Diagnosis not present

## 2020-01-27 DIAGNOSIS — G2581 Restless legs syndrome: Secondary | ICD-10-CM | POA: Diagnosis not present

## 2020-01-27 DIAGNOSIS — E559 Vitamin D deficiency, unspecified: Secondary | ICD-10-CM | POA: Diagnosis not present

## 2020-01-27 DIAGNOSIS — G9009 Other idiopathic peripheral autonomic neuropathy: Secondary | ICD-10-CM | POA: Diagnosis not present

## 2020-01-27 DIAGNOSIS — I10 Essential (primary) hypertension: Secondary | ICD-10-CM | POA: Diagnosis not present

## 2020-01-27 DIAGNOSIS — Z7982 Long term (current) use of aspirin: Secondary | ICD-10-CM | POA: Diagnosis not present

## 2020-01-27 DIAGNOSIS — I2510 Atherosclerotic heart disease of native coronary artery without angina pectoris: Secondary | ICD-10-CM | POA: Diagnosis not present

## 2020-01-27 DIAGNOSIS — I739 Peripheral vascular disease, unspecified: Secondary | ICD-10-CM | POA: Diagnosis not present

## 2020-01-27 DIAGNOSIS — I272 Pulmonary hypertension, unspecified: Secondary | ICD-10-CM | POA: Diagnosis not present

## 2020-01-27 DIAGNOSIS — M5136 Other intervertebral disc degeneration, lumbar region: Secondary | ICD-10-CM | POA: Diagnosis not present

## 2020-01-27 DIAGNOSIS — K219 Gastro-esophageal reflux disease without esophagitis: Secondary | ICD-10-CM | POA: Diagnosis not present

## 2020-01-27 DIAGNOSIS — E538 Deficiency of other specified B group vitamins: Secondary | ICD-10-CM | POA: Diagnosis not present

## 2020-01-27 DIAGNOSIS — Z9181 History of falling: Secondary | ICD-10-CM | POA: Diagnosis not present

## 2020-01-27 DIAGNOSIS — N1831 Chronic kidney disease, stage 3a: Secondary | ICD-10-CM | POA: Diagnosis not present

## 2020-01-28 ENCOUNTER — Encounter: Payer: Medicare Other | Attending: Physician Assistant | Admitting: Physician Assistant

## 2020-01-28 ENCOUNTER — Other Ambulatory Visit: Payer: Self-pay

## 2020-01-28 DIAGNOSIS — I739 Peripheral vascular disease, unspecified: Secondary | ICD-10-CM | POA: Diagnosis not present

## 2020-01-28 DIAGNOSIS — Z7901 Long term (current) use of anticoagulants: Secondary | ICD-10-CM | POA: Insufficient documentation

## 2020-01-28 DIAGNOSIS — I480 Paroxysmal atrial fibrillation: Secondary | ICD-10-CM | POA: Diagnosis not present

## 2020-01-28 DIAGNOSIS — S41102A Unspecified open wound of left upper arm, initial encounter: Secondary | ICD-10-CM | POA: Diagnosis not present

## 2020-01-28 DIAGNOSIS — I70235 Atherosclerosis of native arteries of right leg with ulceration of other part of foot: Secondary | ICD-10-CM | POA: Diagnosis not present

## 2020-01-28 DIAGNOSIS — G2581 Restless legs syndrome: Secondary | ICD-10-CM | POA: Diagnosis not present

## 2020-01-28 DIAGNOSIS — L97512 Non-pressure chronic ulcer of other part of right foot with fat layer exposed: Secondary | ICD-10-CM | POA: Insufficient documentation

## 2020-01-28 DIAGNOSIS — M1990 Unspecified osteoarthritis, unspecified site: Secondary | ICD-10-CM | POA: Diagnosis not present

## 2020-01-28 NOTE — Progress Notes (Addendum)
Crystal, Payne ([id number]) Visit Report for 01/28/2020 Chief Complaint Document Details Patient Name: [name], [name]. Date of Service: 01/28/2020 12:30 PM Medical Record Number: [id number] Patient Account Number: [id number] Date of Birth/Sex: [birth year] (86 y.o. F) Treating RN: Cornell Barman Primary Care Provider: Denman George Other Clinician: Referring Provider: Denman George Treating Provider/Extender: Skipper Cliche in Treatment: 15 Information Obtained from: Patient Chief Complaint Right Lateral Foot Ulcer Electronic Signature(s) Signed: 01/28/2020 1:16:08 PM By: Worthy Keeler PA-C Entered By: Worthy Keeler on 01/28/2020 13:16:08 KATELYN, BROADNAX ([id number]) -------------------------------------------------------------------------------- Debridement Details Patient Name: [name] Date of Service: 01/28/2020 12:30 PM Medical Record Number: [id number] Patient Account Number: [id number] Date of Birth/Sex: 1930/04/06 (86 y.o. F) Treating RN: Cornell Barman Primary Care Provider: Denman George Other Clinician: Referring Provider: Denman George Treating Provider/Extender: Skipper Cliche in Treatment: 15 Debridement Performed for Wound #4 Left,Posterior Upper Arm Assessment: Performed By: Physician Tommie Sams., PA-C Debridement Type: Debridement Level of Consciousness (Pre- Awake and Alert procedure): Pre-procedure Verification/Time Out Yes - 13:20 Taken: Total Area Debrided (L x W): 1 (cm) x 0.3 (cm) = 0.3 (cm) Tissue and other material Non-Viable, Skin: Dermis , Skin: Epidermis debrided: Level: Skin/Epidermis Debridement Description: Selective/Open Wound Instrument: Forceps, Scissors Bleeding: None Response to Treatment: Procedure was tolerated well Level of Consciousness (Post- Awake and Alert procedure): Post Debridement Measurements of Total Wound Length: (cm) 1.2 Width: (cm) 1.1 Depth: (cm) 0.1 Volume: (cm)  0.104 Character of Wound/Ulcer Post Debridement: Stable Post Procedure Diagnosis Same as Pre-procedure Electronic Signature(s) Signed: 01/28/2020 4:39:51 PM By: Worthy Keeler PA-C Signed: 01/31/2020 6:06:12 PM By: Gretta Cool, BSN, RN, CWS, Kim RN, BSN Entered By: Gretta Cool, BSN, RN, CWS, Kim on 01/28/2020 13:21:21 [name] ([id number]) -------------------------------------------------------------------------------- Debridement Details Patient Name: [name]. Date of Service: 01/28/2020 12:30 PM Medical Record Number: [id number] Patient Account Number: [id number] Date of Birth/Sex: [birth year] (86 y.o. F) Treating RN: Cornell Barman Primary Care Provider: Denman George Other Clinician: Referring Provider: Denman George Treating Provider/Extender: Skipper Cliche in Treatment: 15 Debridement Performed for Wound #3 Right Metatarsal head fifth Assessment: Performed By: Physician Tommie Sams., PA-C Debridement Type: Debridement Severity of Tissue Pre Debridement: Fat layer exposed Level of Consciousness (Pre- Awake and Alert procedure): Pre-procedure Verification/Time Out Yes - 13:20 Taken: Total Area Debrided (L x W): 0.8 (cm) x 0.8 (cm) = 0.64 (cm) Tissue and other material Non-Viable, Skin: Dermis , Skin: Epidermis debrided: Level: Skin/Epidermis Debridement Description: Selective/Open Wound Instrument: Forceps, Scissors Bleeding: None Response to Treatment: Procedure was tolerated well Level of Consciousness (Post- Awake and Alert procedure): Post Debridement Measurements of Total Wound Length: (cm) 0.8 Width: (cm) 0.8 Depth: (cm) 0.4 Volume: (cm) 0.201 Character of Wound/Ulcer Post Debridement: Stable Severity of Tissue Post Debridement: Fat layer exposed Post Procedure Diagnosis Same as Pre-procedure Electronic Signature(s) Signed: 01/28/2020 4:39:51 PM By: Worthy Keeler PA-C Signed: 01/31/2020 6:06:12 PM By: Gretta Cool, BSN, RN, CWS, Kim RN,  BSN Entered By: Gretta Cool, BSN, RN, CWS, Kim on 01/28/2020 13:23:51 [name] ([id number]) -------------------------------------------------------------------------------- HPI Details Patient Name: [name]. Date of Service: 01/28/2020 12:30 PM Medical Record Number: [id number] Patient Account Number: [id number] Date of Birth/Sex: February 06, 1930 (86 y.o. F) Treating RN: Cornell Barman Primary Care Provider: Denman George Other Clinician: Referring Provider: Denman George Treating Provider/Extender: Skipper Cliche in Treatment: 15 History of Present Illness HPI Description: 10/12/2019 upon evaluation today patient appears to be doing somewhat poorly in regard to her wound at this point. She has  a ulcer over the right fifth metatarsal region. This has been managed by podiatry up to this point. Unfortunately the patient just does not seem to be making the progress that they were hoping for an subsequently the patient was referred to [place] by [facility] Dr. Posey Pronto. With that being said I did note that the patient does have peripheral vascular disease which has been managed by Dr. Lazaro Arms her most recent angiogram with intervention was on 08/02/2019. The ABIs following were on the right 1.30 with a TBI of 0.61 normal left 1.31 with a TBI of 0.58. Obviously things seem to be doing somewhat better at this point hopefully enough to allow her to heal. She is on anticoagulant therapy due to atrial fibrillation long-term. With that being said the patient is unfortunately having quite a bit of discomfort at this time secondary to the wound. She also has erythema which is very likely secondary to infection. I am going obtain a culture of the area today once debridement is complete. Apparently the patient also had a x-ray that was performed by Dr. Posey Pronto on 09/21/2019 and this showed that the patient had no obvious signs on x-ray of cortical irregularity and no osteomyelitis obviously  noted. With that being said as I discussed with the patient's daughter as well this does not indicate that the patient absolutely has no osteomyelitis as x-rays are only at best 50% helpful in identifying this complication. Nonetheless I do believe that we need to have the patient continue to have this further evaluated specifically I am to recommend that we check into an MRI which I think would be ideal to evaluate whether or not there is osteomyelitis present. 7/16; this is a patient with an ulcer over the right fifth metatarsal head. She [facility] with Dr[facility]. She is due to have an MRI of the foot on 7/26. Using Santyl to the wound 11/04/2019 on evaluation today patient appears to be doing okay in regard to her wound at this time. Unfortunately since I last saw her which was actually her second visit here in the clinic she has moved into a new apartment and then subsequently had a fall. She ended up in the hospital she did fracture her left arm and subsequently has also been dealing with a lot of dizziness she has been at [employer]. She tells me that getting in the change the dressing has been somewhat difficult as far as daily dressing changes are concerned with the Santyl. Nonetheless when they do change it that is what is been used and her daughter-in-law has also been coming in to help out as well with the dressing changes to try to make sure it gets done daily. Fortunately there is no signs of active infection at this time. No fever chills noted. I do believe the Santyl has been beneficial and is likely still the best way to go at this point for the patient. I did review the patient's MRI which showed some potential for reactive osteitis but did not show any signs of osteomyelitis overtly. That cannot be excluded but again right now it does not appear that is very likely present as well. This is good news. I also did review her culture she has been on the Bactrim  she tells me she has taken this and again that was appropriate based on the results of her culture. 11/19/2019 upon evaluation today patient appears to be doing decently well in regard to  her wound. Fortunately there is no signs of active infection at this time wound is not significantly larger in fact is roughly about the same at this point. With that being said there is not as much erythema as previously noted I think the infection seems to be under much better control which is great news. Unfortunately she has fallen since I last saw her and broken her left arm she has an appointment with them following the appointment with me. 8/25; patient was worked in acutely today at the request of home health and was concerned about her right heel, generalized erythema in her feet bilaterally. According to her daughter things have been getting worse over the last for 5 days but not precisely from a wound care point of view. Her wound is on the lateral part of the right fifth metatarsal head looks about the same as I remember this. She has developed dryness and flaking of the skin on her plantar feet. As well a very tender area localized over the insertion point of the Achilles tendon. She has restless leg syndrome. Does not specifically offload the area at night. She wears socks at home and does not really give me a pressure on the right heel issue. I reviewed her vascular status. She underwent her last revascularization on 08/02/2019 by Dr. Lucky Cowboy. This included an angioplasty of the right peroneal artery as well as the right posterior tibial artery. Her last noninvasive arterial study was on 11/23/2019. On the right her ABI was 1.08 TBI of 0.48. On the left ABI at 1.21 with a TBI of 0.54. Family relates that Dr. Lucky Cowboy felt that he had done the best he could and this. We have been using Santyl to the one wound she has on the right lateral fifth metatarsal head. She does not have a new wound today which is the concern  that letter coming into the clinic 12/09/2019 on evaluation today patient appears to be doing decently well in regard to her foot ulcer at this point. She does note that with the compression wrap that the home health nurse put on after contacting our clinic that she actually did much better and felt much better. Her heel is also improved. Fortunately there is no signs of active infection at this time. No fevers, chills, nausea, vomiting, or diarrhea. 12/24/2019 upon evaluation today patient appears to be doing somewhat poorly still in regard to her foot. Unfortunately this is just not showing the signs of improvement that I would like to see it is a little bit cleaner but also appears to be erythematous and I believe there is infection noted at this point. We will get a need to address this in my opinion. I did actually recommend that we switch back to the Iodoflex as well apparently the home health nurse had switch this away that not really what we want to do at this point in my opinion. They were using silver alginate I think most recently. 01/06/2020 upon evaluation today patient actually appears to be doing much better in regard to her wound. We have switched back to the silver alginate after her home health nurse contacted me and honestly I feel like she is doing quite well in that regard. She does have a lot of drainage and therefore I think that is probably a better option for. With that being said she is tolerating the compression wrap without complication that also seems to be helping. 01/28/2020 on evaluation today patient appears to be doing well  with regard to her foot ulcer. I do feel like she is showing signs of improvement which is great news. There is no signs of active infection at this time which is also good news. No fevers, chills, nausea, vomiting, or diarrhea. Unfortunately she did have a fall and sustained an injury to the left upper arm. She has a skin tear here this appears to be  doing okay although AYLYN, WENZLER. ([id number]) the dressing was stuck to this this was an alginate dressing. I really think she probably needs something like Xeroform to try to prevent anything from sticking to this region. Other than that she seems to be doing quite well. Electronic Signature(s) Signed: 01/28/2020 1:44:43 PM By: Worthy Keeler PA-C Entered By: Worthy Keeler on 01/28/2020 13:44:43 [name] ([id number]) -------------------------------------------------------------------------------- Physical Exam Details Patient Name: ARGUSTA, MCGANN. Date of Service: 01/28/2020 12:30 PM Medical Record Number: [id number] Patient Account Number: [id number] Date of Birth/Sex: 1930/02/22 (86 y.o. F) Treating RN: Cornell Barman Primary Care Provider: Denman George Other Clinician: Referring Provider: Denman George Treating Provider/Extender: Skipper Cliche in Treatment: 60 Constitutional Well-nourished and well-hydrated in no acute distress. Respiratory normal breathing without difficulty. Psychiatric this patient is able to make decisions and demonstrates good insight into disease process. Patient is oriented to person and place. pleasant and cooperative. Notes Upon inspection patient's foot ulcer actually is showing some signs of slow but nonetheless slight improvement which is good news. Fortunately there is no signs of active infection at this time also great news. The patient also has a skin tear in the right posterior upper arm which is bleeding currently she is on a blood thinner so that is probably one of the reasons here. With that being said I do think that she will do well with a Xeroform gauze dressing currently. Electronic Signature(s) Signed: 01/28/2020 1:47:48 PM By: Worthy Keeler PA-C Entered By: Worthy Keeler on 01/28/2020 13:47:47 ANISTEN, TOMASSI  ([id number]) -------------------------------------------------------------------------------- Physician Orders Details Patient Name: [name] Date of Service: 01/28/2020 12:30 PM Medical Record Number: [id number] Patient Account Number: [id number] Date of Birth/Sex: [birth year] (86 y.o. F) Treating RN: Cornell Barman Primary Care Provider: Denman George Other Clinician: Referring Provider: Denman George Treating Provider/Extender: Skipper Cliche in Treatment: 15 Verbal / Phone Orders: No Diagnosis Coding ICD-10 Coding Code Description I73.89 Other specified peripheral vascular diseases L97.512 Non-pressure chronic ulcer of other part of right foot with fat layer exposed Z79.01 Long term (current) use of anticoagulants I48.0 Paroxysmal atrial fibrillation B35.3 Tinea pedis Wound Cleansing Wound #3 Right Metatarsal head fifth o Clean wound with Normal Saline. o Dial antibacterial soap, wash wounds, rinse and pat dry prior to dressing wounds Wound #4 Left,Posterior Upper Arm o Clean wound with Normal Saline. o Dial antibacterial soap, wash wounds, rinse and pat dry prior to dressing wounds Primary Wound Dressing Wound #3 Right Metatarsal head fifth o Silver Alginate Wound #4 Left,Posterior Upper Arm o Xeroform Secondary Dressing o Other - heel cup to right heel Wound #3 Right Metatarsal head fifth o Boardered Foam Dressing Wound #4 Left,Posterior Upper Arm o Boardered Foam Dressing Dressing Change Frequency Wound #3 Right Metatarsal head fifth o Other: - Tuesday and Friday. HH to change Tues and Friday weeks patient does not come to the [facility] and on opposite weeks [place] to changed on Tuesday and [place] changes on Friday. Follow-up Appointments Wound #3 Right Metatarsal head fifth o Return Appointment in 3 weeks. Off-Loading Wound #3 Right Metatarsal head  fifth o Other: - Keep pressure off of the wound, heel cup on right heel Additional  Orders / Instructions Wound #3 Right Metatarsal head fifth o Increase protein intake. [place] ([id number]) o [place] Visits - [place] Nurse may visit PRN to address patientos wound care needs. o FACE TO FACE ENCOUNTER: MEDICARE and MEDICAID PATIENTS: I certify that this patient is under my care and that I had a face-to-face encounter that meets the physician face-to-face encounter requirements with this patient on this date. The encounter with the patient was in whole or in part for the following MEDICAL CONDITION: (primary reason for [place]) MEDICAL NECESSITY: I certify, that based on my findings, NURSING services are a medically necessary home health service. HOME BOUND STATUS: I certify that my clinical findings support that this patient is homebound (i.e., Due to illness or injury, pt requires aid of supportive devices such as crutches, cane, wheelchairs, walkers, the use of special transportation or the assistance of another person to leave their place of residence. There is a normal inability to leave the home and doing so requires considerable and taxing effort. Other absences are for medical reasons / religious services and are infrequent or of short duration when for other reasons). o If current dressing causes regression in wound condition, may D/C ordered dressing product/s and apply Normal Saline Moist Dressing daily until next [place] / Other MD appointment. [place] of regression in wound condition at [phone]. o Please direct any NON-WOUND related issues/requests for orders to patient's Primary Care Physician Electronic Signature(s) Signed: 01/28/2020 4:39:51 PM By: Worthy Keeler PA-C Signed: 01/31/2020 6:06:12 PM By: Gretta Cool, BSN, RN, CWS, Kim RN, BSN Entered By: Gretta Cool, BSN, RN, CWS, Kim on 01/28/2020 13:29:45 [name]  ([id number]) -------------------------------------------------------------------------------- Problem List Details Patient Name: ASSYRIA, MORREALE. Date of Service: 01/28/2020 12:30 PM Medical Record Number: [id number] Patient Account Number: [id number] Date of Birth/Sex: 02/25/1930 (86 y.o. F) Treating RN: Cornell Barman Primary Care Provider: Denman George Other Clinician: Referring Provider: Denman George Treating Provider/Extender: Skipper Cliche in Treatment: 15 Active Problems ICD-10 Encounter Code Description Active Date MDM Diagnosis I73.89 Other specified peripheral vascular diseases 10/12/2019 No Yes L97.512 Non-pressure chronic ulcer of other part of right foot with fat layer 10/12/2019 No Yes exposed S41.102A Unspecified open wound of left upper arm, initial encounter 01/28/2020 No Yes Z79.01 Long term (current) use of anticoagulants 10/12/2019 No Yes I48.0 Paroxysmal atrial fibrillation 10/12/2019 No Yes B35.3 Tinea pedis 12/01/2019 No Yes Inactive Problems Resolved Problems Electronic Signature(s) Signed: 01/28/2020 1:44:32 PM By: Worthy Keeler PA-C Previous Signature: 01/28/2020 1:14:59 PM Version By: Worthy Keeler PA-C Entered By: Worthy Keeler on 01/28/2020 13:44:31 [name] ([id number]) -------------------------------------------------------------------------------- Progress Note Details Patient Name: [name]. Date of Service: 01/28/2020 12:30 PM Medical Record Number: [id number] Patient Account Number: [id number] Date of Birth/Sex: 11/15/1929 (86 y.o. F) Treating RN: Cornell Barman Primary Care Provider: Denman George Other Clinician: Referring Provider: Denman George Treating Provider/Extender: Skipper Cliche in Treatment: 15 Subjective Chief Complaint Information obtained from Patient Right Lateral Foot Ulcer History of Present Illness (HPI) 10/12/2019 upon evaluation today patient appears to be doing somewhat poorly in  regard to her wound at this point. She has a ulcer over the right fifth metatarsal region. This has been managed by podiatry up to this point. Unfortunately the patient just does not seem to be making the progress that they were  hoping for an subsequently the patient was referred to [place] by [facility] Dr. Posey Pronto. With that being said I did note that the patient does have peripheral vascular disease which has been managed by Dr. Lazaro Arms her most recent angiogram with intervention was on 08/02/2019. The ABIs following were on the right 1.30 with a TBI of 0.61 normal left 1.31 with a TBI of 0.58. Obviously things seem to be doing somewhat better at this point hopefully enough to allow her to heal. She is on anticoagulant therapy due to atrial fibrillation long-term. With that being said the patient is unfortunately having quite a bit of discomfort at this time secondary to the wound. She also has erythema which is very likely secondary to infection. I am going obtain a culture of the area today once debridement is complete. Apparently the patient also had a x-ray that was performed by Dr. Posey Pronto on 09/21/2019 and this showed that the patient had no obvious signs on x-ray of cortical irregularity and no osteomyelitis obviously noted. With that being said as I discussed with the patient's daughter as well this does not indicate that the patient absolutely has no osteomyelitis as x-rays are only at best 50% helpful in identifying this complication. Nonetheless I do believe that we need to have the patient continue to have this further evaluated specifically I am to recommend that we check into an MRI which I think would be ideal to evaluate whether or not there is osteomyelitis present. 7/16; this is a patient with an ulcer over the right fifth metatarsal head. She [facility] with [facility]. She is due to have an MRI of the foot on 7/26. Using Santyl to the wound 11/04/2019 on  evaluation today patient appears to be doing okay in regard to her wound at this time. Unfortunately since I last saw her which was actually her second visit here in the clinic she has moved into a new apartment and then subsequently had a fall. She ended up in the hospital she did fracture her left arm and subsequently has also been dealing with a lot of dizziness she has been at [employer]. She tells me that getting in the change the dressing has been somewhat difficult as far as daily dressing changes are concerned with the Santyl. Nonetheless when they do change it that is what is been used and her daughter-in-law has also been coming in to help out as well with the dressing changes to try to make sure it gets done daily. Fortunately there is no signs of active infection at this time. No fever chills noted. I do believe the Santyl has been beneficial and is likely still the best way to go at this point for the patient. I did review the patient's MRI which showed some potential for reactive osteitis but did not show any signs of osteomyelitis overtly. That cannot be excluded but again right now it does not appear that is very likely present as well. This is good news. I also did review her culture she has been on the Bactrim she tells me she has taken this and again that was appropriate based on the results of her culture. 11/19/2019 upon evaluation today patient appears to be doing decently well in regard to her wound. Fortunately there is no signs of active infection at this time wound is not significantly larger in fact is roughly about the same at this point. With that being said there is  not as much erythema as previously noted I think the infection seems to be under much better control which is great news. Unfortunately she has fallen since I last saw her and broken her left arm she has an appointment with them following the appointment with me. 8/25; patient was worked in acutely  today at the request of home health and was concerned about her right heel, generalized erythema in her feet bilaterally. According to her daughter things have been getting worse over the last for 5 days but not precisely from a wound care point of view. Her wound is on the lateral part of the right fifth metatarsal head looks about the same as I remember this. She has developed dryness and flaking of the skin on her plantar feet. As well a very tender area localized over the insertion point of the Achilles tendon. She has restless leg syndrome. Does not specifically offload the area at night. She wears socks at home and does not really give me a pressure on the right heel issue. I reviewed her vascular status. She underwent her last revascularization on 08/02/2019 by Dr. Lucky Cowboy. This included an angioplasty of the right peroneal artery as well as the right posterior tibial artery. Her last noninvasive arterial study was on 11/23/2019. On the right her ABI was 1.08 TBI of 0.48. On the left ABI at 1.21 with a TBI of 0.54. Family relates that Dr. Lucky Cowboy felt that he had done the best he could and this. We have been using Santyl to the one wound she has on the right lateral fifth metatarsal head. She does not have a new wound today which is the concern that letter coming into the clinic 12/09/2019 on evaluation today patient appears to be doing decently well in regard to her foot ulcer at this point. She does note that with the compression wrap that the home health nurse put on after contacting our clinic that she actually did much better and felt much better. Her heel is also improved. Fortunately there is no signs of active infection at this time. No fevers, chills, nausea, vomiting, or diarrhea. 12/24/2019 upon evaluation today patient appears to be doing somewhat poorly still in regard to her foot. Unfortunately this is just not showing the signs of improvement that I would like to see it is a little bit cleaner  but also appears to be erythematous and I believe there is infection noted at this point. We will get a need to address this in my opinion. I did actually recommend that we switch back to the Iodoflex as well apparently the home health nurse had switch this away that not really what we want to do at this point in my opinion. They were using silver alginate I think most recently. 01/06/2020 upon evaluation today patient actually appears to be doing much better in regard to her wound. We have switched back to the silver alginate after her home health nurse contacted me and honestly I feel like she is doing quite well in that regard. She does have a lot of drainage and therefore I think that is probably a better option for. With that being said she is tolerating the compression wrap without complication that CONSTANTINA, LASETER. ([id number]) also seems to be helping. 01/28/2020 on evaluation today patient appears to be doing well with regard to her foot ulcer. I do feel like she is showing signs of improvement which is great news. There is no signs of active infection at this  time which is also good news. No fevers, chills, nausea, vomiting, or diarrhea. Unfortunately she did have a fall and sustained an injury to the left upper arm. She has a skin tear here this appears to be doing okay although the dressing was stuck to this this was an alginate dressing. I really think she probably needs something like Xeroform to try to prevent anything from sticking to this region. Other than that she seems to be doing quite well. Objective Constitutional Well-nourished and well-hydrated in no acute distress. Vitals Time Taken: 12:45 PM, Height: 62 in, Weight: 140 lbs, BMI: 25.6, Temperature: 97.4 F, Pulse: 66 bpm, Respiratory Rate: 18 breaths/min, Blood Pressure: 178/71 mmHg. Respiratory normal breathing without difficulty. Psychiatric this patient is able to make decisions and demonstrates good insight into  disease process. Patient is oriented to person and place. pleasant and cooperative. General Notes: Upon inspection patient's foot ulcer actually is showing some signs of slow but nonetheless slight improvement which is good news. Fortunately there is no signs of active infection at this time also great news. The patient also has a skin tear in the right posterior upper arm which is bleeding currently she is on a blood thinner so that is probably one of the reasons here. With that being said I do think that she will do well with a Xeroform gauze dressing currently. Integumentary (Hair, Skin) Wound #3 status is Open. Original cause of wound was Gradually Appeared. The wound is located on the Right Metatarsal head fifth. The wound measures 0.8cm length x 0.6cm width x 0.3cm depth; 0.377cm^2 area and 0.113cm^3 volume. There is Fat Layer (Subcutaneous Tissue) exposed. There is undermining starting at 12:00 and ending at 12:00 with a maximum distance of 0.5cm. There is a medium amount of serosanguineous drainage noted. The wound margin is distinct with the outline attached to the wound base. There is small (1-33%) red granulation within the wound bed. There is a large (67-100%) amount of necrotic tissue within the wound bed including Adherent Slough. Wound #4 status is Open. Original cause of wound was Trauma. The wound is located on the Left,Posterior Upper Arm. The wound measures 1.2cm length x 1.1cm width x 0.1cm depth; 1.037cm^2 area and 0.104cm^3 volume. There is no tunneling or undermining noted. There is a large amount of sanguinous drainage noted. The wound margin is indistinct and nonvisible. There is no granulation within the wound bed. There is no necrotic tissue within the wound bed. Assessment Active Problems ICD-10 Other specified peripheral vascular diseases Non-pressure chronic ulcer of other part of right foot with fat layer exposed Unspecified open wound of left upper arm, initial  encounter Long term (current) use of anticoagulants Paroxysmal atrial fibrillation Tinea pedis Procedures Wound #3 Pre-procedure diagnosis of Wound #3 is an Arterial Insufficiency Ulcer located on the Right Metatarsal head fifth .Severity of Tissue Pre Debridement is: Fat layer exposed. There was a Selective/Open Wound Skin/Epidermis Debridement with a total area of 0.64 sq cm performed by [name] ([id number]) Tommie Sams., PA-C. With the following instrument(s): Forceps, and Scissors to remove Non-Viable tissue/material. Material removed includes Skin: Dermis and Skin: Epidermis and. No specimens were taken. A time out was conducted at 13:20, prior to the start of the procedure. There was no bleeding. The procedure was tolerated well. Post Debridement Measurements: 0.8cm length x 0.8cm width x 0.4cm depth; 0.201cm^3 volume. Character of Wound/Ulcer Post Debridement is stable. Severity of Tissue Post Debridement is: Fat layer exposed. Post procedure Diagnosis Wound #3:  Same as Pre-Procedure Wound #4 Pre-procedure diagnosis of Wound #4 is a Trauma, Other located on the Left,Posterior Upper Arm . There was a Selective/Open Wound Skin/Epidermis Debridement with a total area of 0.3 sq cm performed by Tommie Sams., PA-C. With the following instrument(s): Forceps, and Scissors to remove Non-Viable tissue/material. Material removed includes Skin: Dermis and Skin: Epidermis and. No specimens were taken. A time out was conducted at 13:20, prior to the start of the procedure. There was no bleeding. The procedure was tolerated well. Post Debridement Measurements: 1.2cm length x 1.1cm width x 0.1cm depth; 0.104cm^3 volume. Character of Wound/Ulcer Post Debridement is stable. Post procedure Diagnosis Wound #4: Same as Pre-Procedure Plan Wound Cleansing: Wound #3 Right Metatarsal head fifth: Clean wound with Normal Saline. Dial antibacterial soap, wash wounds, rinse and pat dry prior to  dressing wounds Wound #4 Left,Posterior Upper Arm: Clean wound with Normal Saline. Dial antibacterial soap, wash wounds, rinse and pat dry prior to dressing wounds Primary Wound Dressing: Wound #3 Right Metatarsal head fifth: Silver Alginate Wound #4 Left,Posterior Upper Arm: Xeroform Secondary Dressing: Other - heel cup to right heel Wound #3 Right Metatarsal head fifth: Boardered Foam Dressing Wound #4 Left,Posterior Upper Arm: Boardered Foam Dressing Dressing Change Frequency: Wound #3 Right Metatarsal head fifth: Other: - Tuesday and Friday. HH to change Tues and Friday weeks patient does not come to the [facility] and on opposite weeks [place] to changed on Tuesday and [place] changes on Friday. Follow-up Appointments: Wound #3 Right Metatarsal head fifth: Return Appointment in 3 weeks. Off-Loading: Wound #3 Right Metatarsal head fifth: Other: - Keep pressure off of the wound, heel cup on right heel Additional Orders / Instructions: Wound #3 Right Metatarsal head fifth: Increase protein intake. Home Health: [facility] - [place] Nurse may visit PRN to address patient s wound care needs. FACE TO FACE ENCOUNTER: MEDICARE and MEDICAID PATIENTS: I certify that this patient is under my care and that I had a face-to-face encounter that meets the physician face-to-face encounter requirements with this patient on this date. The encounter with the patient was in whole or in part for the following MEDICAL CONDITION: (primary reason for [place]) MEDICAL NECESSITY: I certify, that based on my findings, NURSING services are a medically necessary home health service. HOME BOUND STATUS: I certify that my clinical findings support that this patient is homebound (i.e., Due to illness or injury, pt requires aid of supportive devices such as crutches, cane, wheelchairs, walkers, the use of special transportation or the assistance of another person to leave their place of  residence. There is a normal inability to leave the home and doing so requires considerable and taxing effort. Other absences are for medical reasons / religious services and are infrequent or of short duration when for other reasons). If current dressing causes regression in wound condition, may D/C ordered dressing product/s and apply Normal Saline Moist Dressing daily until next [place] / Other MD appointment. [place] of regression in wound condition at [phone]. Please direct any NON-WOUND related issues/requests for orders to patient's Primary Care Physician 1. I would recommend at this time that we going to continue with the wound care measures as before with regard to the foot which is a silver alginate dressing. 2. We can initiate treatment with a Xeroform gauze dressing to the left arm region in the posterior upper arm specifically. 3. I recommend a border foam to cover the foot and the  arm location which I think will protect it as well. JERMEKA, SCHLOTTERBECK ([id number]) 4. I am also going to suggest at this time that the patient continue to be very careful she should be using a walker or something to stabilize when she is walking to prevent additional falls and injury. We will see patient back for reevaluation in 2 weeks here in the clinic. If anything worsens or changes patient will contact our office for additional recommendations. Electronic Signature(s) Signed: 01/28/2020 1:49:37 PM By: Worthy Keeler PA-C Entered By: Worthy Keeler on 01/28/2020 13:49:36 TEREN, FRANCKOWIAK ([id number]) -------------------------------------------------------------------------------- SuperBill Details Patient Name: [name] Date of Service: 01/28/2020 Medical Record Number: [id number] Patient Account Number: [id number] Date of Birth/Sex: 1930-01-01 (86 y.o. F) Treating RN: Cornell Barman Primary Care Provider: Denman George Other Clinician: Referring  Provider: Denman George Treating Provider/Extender: Skipper Cliche in Treatment: 15 Diagnosis Coding ICD-10 Codes Code Description I73.89 Other specified peripheral vascular diseases L97.512 Non-pressure chronic ulcer of other part of right foot with fat layer exposed S41.102A Unspecified open wound of left upper arm, initial encounter Z79.01 Long term (current) use of anticoagulants I48.0 Paroxysmal atrial fibrillation B35.3 Tinea pedis Facility Procedures CPT4 Code: 83382505 Description: [phone] - [place]BR[place] WOUND 1ST 20 SQ CM OR < Modifier: Quantity: 1 CPT4 Code: Description: ICD-10 Diagnosis Description L97.512 Non-pressure chronic ulcer of other part of right foot with fat layer exp Modifier: osed Quantity: Physician Procedures CPT4 Code: 3419379 Description: 02409 - WC PHYS LEVEL 4 - EST PT Modifier: 25 Quantity: 1 CPT4 Code: Description: ICD-10 Diagnosis Description I73.89 Other specified peripheral vascular diseases L97.512 Non-pressure chronic ulcer of other part of right foot with fat layer exp S41.102A Unspecified open wound of left upper arm, initial encounter Z79.01  Long term (current) use of anticoagulants Modifier: osed Quantity: CPT4 Code: 7353299 Description: 97597 - WC PHYS DEBR WO ANESTH 20 SQ CM Modifier: Quantity: 1 CPT4 Code: Description: ICD-10 Diagnosis Description L97.512 Non-pressure chronic ulcer of other part of right foot with fat layer exp Modifier: osed Quantity: Electronic Signature(s) Signed: 01/28/2020 1:58:57 PM By: Worthy Keeler PA-C Entered By: Worthy Keeler on 01/28/2020 13:58:57

## 2020-02-01 ENCOUNTER — Ambulatory Visit: Payer: Medicare Other | Admitting: Cardiovascular Disease

## 2020-02-01 ENCOUNTER — Ambulatory Visit (INDEPENDENT_AMBULATORY_CARE_PROVIDER_SITE_OTHER): Payer: Medicare Other | Admitting: Cardiovascular Disease

## 2020-02-01 ENCOUNTER — Other Ambulatory Visit: Payer: Self-pay

## 2020-02-01 ENCOUNTER — Encounter: Payer: Self-pay | Admitting: Cardiovascular Disease

## 2020-02-01 VITALS — BP 150/74 | HR 58 | Ht 62.0 in | Wt 134.5 lb

## 2020-02-01 DIAGNOSIS — I739 Peripheral vascular disease, unspecified: Secondary | ICD-10-CM | POA: Diagnosis not present

## 2020-02-01 DIAGNOSIS — I5032 Chronic diastolic (congestive) heart failure: Secondary | ICD-10-CM | POA: Diagnosis not present

## 2020-02-01 DIAGNOSIS — L97513 Non-pressure chronic ulcer of other part of right foot with necrosis of muscle: Secondary | ICD-10-CM | POA: Diagnosis not present

## 2020-02-01 DIAGNOSIS — Z7952 Long term (current) use of systemic steroids: Secondary | ICD-10-CM | POA: Diagnosis not present

## 2020-02-01 DIAGNOSIS — Z7901 Long term (current) use of anticoagulants: Secondary | ICD-10-CM | POA: Diagnosis not present

## 2020-02-01 DIAGNOSIS — Z7982 Long term (current) use of aspirin: Secondary | ICD-10-CM | POA: Diagnosis not present

## 2020-02-01 DIAGNOSIS — I10 Essential (primary) hypertension: Secondary | ICD-10-CM

## 2020-02-01 DIAGNOSIS — E559 Vitamin D deficiency, unspecified: Secondary | ICD-10-CM | POA: Diagnosis not present

## 2020-02-01 DIAGNOSIS — I872 Venous insufficiency (chronic) (peripheral): Secondary | ICD-10-CM | POA: Diagnosis not present

## 2020-02-01 DIAGNOSIS — I272 Pulmonary hypertension, unspecified: Secondary | ICD-10-CM | POA: Diagnosis not present

## 2020-02-01 DIAGNOSIS — E538 Deficiency of other specified B group vitamins: Secondary | ICD-10-CM | POA: Diagnosis not present

## 2020-02-01 DIAGNOSIS — I4891 Unspecified atrial fibrillation: Secondary | ICD-10-CM | POA: Diagnosis not present

## 2020-02-01 DIAGNOSIS — E7849 Other hyperlipidemia: Secondary | ICD-10-CM | POA: Diagnosis not present

## 2020-02-01 DIAGNOSIS — M1990 Unspecified osteoarthritis, unspecified site: Secondary | ICD-10-CM | POA: Diagnosis not present

## 2020-02-01 DIAGNOSIS — I130 Hypertensive heart and chronic kidney disease with heart failure and stage 1 through stage 4 chronic kidney disease, or unspecified chronic kidney disease: Secondary | ICD-10-CM | POA: Diagnosis not present

## 2020-02-01 DIAGNOSIS — E785 Hyperlipidemia, unspecified: Secondary | ICD-10-CM

## 2020-02-01 DIAGNOSIS — Z9181 History of falling: Secondary | ICD-10-CM | POA: Diagnosis not present

## 2020-02-01 DIAGNOSIS — I2510 Atherosclerotic heart disease of native coronary artery without angina pectoris: Secondary | ICD-10-CM | POA: Diagnosis not present

## 2020-02-01 DIAGNOSIS — G9009 Other idiopathic peripheral autonomic neuropathy: Secondary | ICD-10-CM | POA: Diagnosis not present

## 2020-02-01 DIAGNOSIS — I4819 Other persistent atrial fibrillation: Secondary | ICD-10-CM | POA: Diagnosis not present

## 2020-02-01 DIAGNOSIS — S62102D Fracture of unspecified carpal bone, left wrist, subsequent encounter for fracture with routine healing: Secondary | ICD-10-CM | POA: Diagnosis not present

## 2020-02-01 DIAGNOSIS — N1831 Chronic kidney disease, stage 3a: Secondary | ICD-10-CM | POA: Diagnosis not present

## 2020-02-01 DIAGNOSIS — M5136 Other intervertebral disc degeneration, lumbar region: Secondary | ICD-10-CM | POA: Diagnosis not present

## 2020-02-01 DIAGNOSIS — K219 Gastro-esophageal reflux disease without esophagitis: Secondary | ICD-10-CM | POA: Diagnosis not present

## 2020-02-01 DIAGNOSIS — G2581 Restless legs syndrome: Secondary | ICD-10-CM | POA: Diagnosis not present

## 2020-02-01 DIAGNOSIS — M810 Age-related osteoporosis without current pathological fracture: Secondary | ICD-10-CM | POA: Diagnosis not present

## 2020-02-01 NOTE — Patient Instructions (Signed)
Medication Instructions:  Your physician recommends that you continue on your current medications as directed. Please refer to the Current Medication list given to you today.  *If you need a refill on your cardiac medications before your next appointment, please call your pharmacy*   Lab Work: None ordered  If you have labs (blood work) drawn today and your tests are completely normal, you will receive your results only by: MyChart Message (if you have MyChart) OR A paper copy in the mail If you have any lab test that is abnormal or we need to change your treatment, we will call you to review the results.   Testing/Procedures: None ordered   Follow-Up: At [facility], you and your health needs are our priority.  As part of our continuing mission to provide you with exceptional heart care, we have created designated Provider Care Teams.  These Care Teams include your primary Cardiologist (physician) and Advanced Practice Providers (APPs -  Physician Assistants and Nurse Practitioners) who all work together to provide you with the care you need, when you need it.  We recommend signing up for the patient portal called "MyChart".  Sign up information is provided on this After Visit Summary.  MyChart is used to connect with patients for Virtual Visits (Telemedicine).  Patients are able to view lab/test results, encounter notes, upcoming appointments, etc.  Non-urgent messages can be sent to your provider as well.   To learn more about what you can do with MyChart, go to [email or web address].    Your next appointment:   4 month(s)  The format for your next appointment:   In Person  Provider:   You may see Muhammad Arida, MD or one of the following Advanced Practice Providers on your designated Care Team:   Christopher Berge, NP Ryan Dunn, PA-C Jacquelyn Visser, PA-C Cadence Furth, PA-C   Other Instructions N/A  

## 2020-02-01 NOTE — Progress Notes (Addendum)
[name], [name] ([id number]) Visit Report for 01/28/2020 Arrival Information Details Patient Name: [name], [name]. Date of Service: 01/28/2020 12:30 PM Medical Record Number: [id number] Patient Account Number: [id number] Date of Birth/Sex: 1929-06-20 (86 y.o. F) Treating RN: [name] Primary Care [name]: [name] Other Clinician: Referring [name]: [name] Treating [name]/Extender: [name] in Treatment: 15 Visit Information History Since Last Visit Added or deleted any medications: Yes Patient Arrived: Wheel Chair Any new allergies or adverse reactions: No Arrival Time: 12:43 Had a fall or experienced change in Yes Accompanied By: friend activities of daily living that may affect Transfer Assistance: None risk of falls: Patient Identification Verified: Yes Signs or symptoms of abuse/neglect since last visito No Secondary Verification Process Completed: Yes Hospitalized since last visit: No Patient Requires Transmission-Based No Implantable device outside of the clinic excluding No Precautions: cellular tissue based products placed in the center Patient Has Alerts: Yes since last visit: Patient Alerts: 08/31/19 ABI R)1.3 L) Has Dressing in Place as Prescribed: Yes 1.31 Pain Present Now: No 08/31/19 TBI R).61 L).58 Electronic Signature(s) Signed: 01/28/2020 3:51:42 PM By: [name] RCP, RRT, CHT Entered By: [name] on 01/28/2020 12:45:41 [name] ([id number]) -------------------------------------------------------------------------------- Complex / Palliative Patient Assessment Details Patient Name: [name], [name]. Date of Service: 01/28/2020 12:30 PM Medical Record Number: [id number] Patient Account Number: [id number] Date of Birth/Sex: [birth year] (86 y.o. F) Treating RN: [name] Primary Care [name]: [name] Other Clinician: Referring [name]: [name] Treating [name]/Extender: [name] in Treatment: 15 Palliative Management Criteria Complex Wound Management Criteria Evaluation by a vascular surgeon has determined that the patient is not a revascularization candidate due to: 11/23/19 Patient has still low TBI values But Dr. Lucky Cowboy feels this is the best he can do. Documented Care Approach Wound Care Plan: Complex Wound Management Electronic Signature(s) Signed: 02/04/2020 9:29:44 AM By: Crystal Keeler PA-C Entered By: [name] on 02/04/2020 09:29:43 [name], [name] ([id number]) -------------------------------------------------------------------------------- Encounter Discharge Information Details Patient Name: [name] Date of Service: 01/28/2020 12:30 PM Medical Record Number: [id number] Patient Account Number: [id number] Date of Birth/Sex: June 01, 1929 (86 y.o. F) Treating RN: [name] Primary Care [name]: [name] Other Clinician: Referring Amisadai Woodford: [name] Treating [name]/Extender: [name] in Treatment: 15 Encounter Discharge Information Items Post Procedure Vitals Discharge Condition: Stable Temperature (F): 97.4 Ambulatory Status: Ambulatory Pulse (bpm): 66 Discharge Destination: Home Health Respiratory Rate (breaths/min): 18 Telephoned: No Blood Pressure (mmHg): 178/70 Orders Sent: Yes Transportation: Private Auto Accompanied By: daughter Schedule Follow-up Appointment: Yes Clinical Summary of Care: Electronic Signature(s) Signed: 01/31/2020 6:06:12 PM By: [name] Entered By: [name], BSN, RN, CWS, [name] on 01/28/2020 13:37:38 [name] ([id number]) -------------------------------------------------------------------------------- Lower Extremity Assessment Details Patient Name: [name]. Date of Service: 01/28/2020 12:30 PM Medical Record Number: [id number] Patient Account Number: [id number] Date of Birth/Sex:  12/15/1929 (86 y.o. F) Treating RN: [name] Primary Care [name]: [name] Other Clinician: Referring [name]: [name] Treating [name]: [name] in Treatment: 15 Vascular Assessment Pulses: Dorsalis Pedis Palpable: [Right:Yes] Electronic Signature(s) Signed: 01/31/2020 6:06:12 PM By: [name] Entered By: [name], BSN, RN, CWS, [name] on 01/28/2020 13:15:06 [name] ([id number]) -------------------------------------------------------------------------------- Multi Wound Chart Details Patient Name: [name] Date of Service: 01/28/2020 12:30 PM Medical Record Number: [id number] Patient Account Number: [id number] Date of Birth/Sex: 08/02/1929 (85 y.o. F) Treating RN: [name] Primary Care Kelvin Burpee: [name],  [name] Other Clinician: Referring [name]: [name] Treating [name]/Extender: [name] in Treatment: 15 Vital Signs Height(in): 62 Pulse(bpm): 24 Weight(lbs): 140 Blood Pressure(mmHg): 178/71 Body Mass Index(BMI): 26 Temperature(F): 97.4 Respiratory Rate(breaths/min): 18 Photos: [N/A:N/A] Wound Location: Right Metatarsal head fifth Left, Posterior Upper Arm N/A Wounding Event: Gradually Appeared Trauma N/A Primary Etiology: Arterial Insufficiency Ulcer Trauma, Other N/A Comorbid History: Arrhythmia, Osteoarthritis Arrhythmia, Osteoarthritis N/A Date Acquired: 04/09/2019 01/17/2020 N/A Weeks of Treatment: 15 0 N/A Wound Status: Open Open N/A Measurements L x W x D (cm) 0.8x0.6x0.3 1.2x1.1x0.1 N/A Area (cm) : 0.377 1.037 N/A Volume (cm) : 0.113 0.104 N/A % Reduction in Area: 66.70% 0.00% N/A % Reduction in Volume: 0.00% 0.00% N/A Starting Position 1 (o'clock): 12 Ending Position 1 (o'clock): 12 Maximum Distance 1 (cm): 0.5 Undermining: Yes No N/A Classification: Full Thickness Without Exposed Partial Thickness N/A Support Structures Exudate Amount: Medium Large  N/A Exudate Type: Serosanguineous Sanguinous N/A Exudate Color: red, brown red N/A Wound Margin: Distinct, outline attached Indistinct, nonvisible N/A Granulation Amount: Small (1-33%) None Present (0%) N/A Granulation Quality: Red N/A N/A Necrotic Amount: Large (67-100%) None Present (0%) N/A Exposed Structures: Fat Layer (Subcutaneous Tissue): Fascia: No N/A Yes Fat Layer (Subcutaneous Tissue): Fascia: No No Tendon: No Tendon: No Muscle: No Muscle: No Joint: No Joint: No Bone: No Bone: No Epithelialization: None N/A N/A Treatment Notes Electronic Signature(s) Signed: 01/31/2020 6:06:12 PM By: [name] Entered By: [name], BSN, RN, CWS, [name] on 01/28/2020 13:18:46 [name], [name] ([id number]) [name], [name] ([id number]) -------------------------------------------------------------------------------- [place] Details Patient Name: [name], [name]. Date of Service: 01/28/2020 12:30 PM Medical Record Number: [id number] Patient Account Number: [id number] Date of Birth/Sex: March 18, 1930 (86 y.o. F) Treating RN: [name] Primary Care Zaila Crew: [name] Other Clinician: Referring Darrio Bade: [name] Treating Jonavon Trieu/Extender: [name] in Treatment: 15 Active Inactive Abuse / Safety / Falls / Self Care Management Nursing Diagnoses: Potential for falls Goals: Patient/caregiver will verbalize understanding of skin care regimen Date Initiated: 10/12/2019 Target Resolution Date: 11/09/2019 Goal Status: Active Interventions: Assess fall risk on admission and as needed Notes: Necrotic Tissue Nursing Diagnoses: Impaired tissue integrity related to necrotic/devitalized tissue Goals: Necrotic/devitalized tissue will be minimized in the wound bed Date Initiated: 10/12/2019 Target Resolution Date: 11/09/2019 Goal Status: Active Interventions: Assess patient pain level pre-, during and post procedure and prior to  discharge Treatment Activities: Enzymatic debridement : 10/12/2019 Notes: Orientation to the Wound Care Program Nursing Diagnoses: Knowledge deficit related to the wound healing center program Goals: Patient/caregiver will verbalize understanding of the [facility] Date Initiated: 10/12/2019 Target Resolution Date: 11/09/2019 Goal Status: Active Interventions: Provide education on orientation to the wound center Notes: Pain, Acute or Chronic Nursing Diagnoses: Potential alteration in comfort, pain Goals: Patient will verbalize adequate pain control and receive pain control interventions during procedures as needed [name], [name] ([id number]) Date Initiated: 10/12/2019 Target Resolution Date: 11/09/2019 Goal Status: Active Interventions: Complete pain assessment as per visit requirements Treatment Activities: Administer pain control measures as ordered : 10/12/2019 Notes: Peripheral Neuropathy Nursing Diagnoses: Knowledge deficit related to disease process and management of peripheral neurovascular dysfunction Potential alteration in peripheral tissue perfusion (select prior to confirmation of diagnosis) Goals: Patient/caregiver will verbalize understanding of disease process and disease management Date Initiated: 10/12/2019 Target Resolution Date: 11/09/2019 Goal Status: Active Interventions: Assess signs and symptoms of neuropathy upon admission and as needed Provide education on Management of Neuropathy and Related Ulcers Notes: Wound/Skin Impairment  Nursing Diagnoses: Impaired tissue integrity Goals: Ulcer/skin breakdown will have a volume reduction of 30% by week 4 Date Initiated: 10/12/2019 Target Resolution Date: 11/09/2019 Goal Status: Active Interventions: Assess ulceration(s) every visit Treatment Activities: Skin care regimen initiated : 10/12/2019 Topical wound management initiated : 10/12/2019 Notes: Electronic Signature(s) Signed: 01/31/2020 6:06:12 PM  By: [name] Entered By: [name], BSN, RN, CWS, [name] on 01/28/2020 13:18:38 [name] ([id number]) -------------------------------------------------------------------------------- Pain Assessment Details Patient Name: [name], [name]. Date of Service: 01/28/2020 12:30 PM Medical Record Number: [id number] Patient Account Number: [id number] Date of Birth/Sex: 1929-07-02 (86 y.o. F) Treating RN: [name] Primary Care Aylee Littrell: [name] Other Clinician: Referring Haile Toppins: [name] Treating Benino Korinek/Extender: [name] in Treatment: 15 Active Problems Location of Pain Severity and Description of Pain Patient Has Paino No Site Locations Pain Management and Medication Current Pain Management: Notes Patient does not have pain in the wounds. Electronic Signature(s) Signed: 01/31/2020 6:06:12 PM By: [name] Entered By: [name], BSN, RN, CWS, [name] on 01/28/2020 12:56:52 [name] ([id number]) -------------------------------------------------------------------------------- Patient/Caregiver Education Details Patient Name: HAYLYNN, PHA. Date of Service: 01/28/2020 12:30 PM Medical Record Number: [id number] Patient Account Number: [id number] Date of Birth/Gender: 18-Oct-1929 (86 y.o. F) Treating RN: [name] Primary Care Physician: [name] Other Clinician: Referring Physician: Denman Payne Treating Physician/Extender: [name] in Treatment: 15 Education Assessment Education Provided To: Patient Education Topics Provided Wound/Skin Impairment: Handouts: Caring for Your Ulcer Methods: Demonstration, Explain/Verbal Responses: State content correctly Electronic Signature(s) Signed: 01/31/2020 6:06:12 PM By: [name] Entered By: [name], BSN, RN, CWS, [name] on 01/28/2020 13:35:05 [name]  ([id number]) -------------------------------------------------------------------------------- Wound Assessment Details Patient Name: [name], [name]. Date of Service: 01/28/2020 12:30 PM Medical Record Number: [id number] Patient Account Number: [id number] Date of Birth/Sex: February 16, 1930 (86 y.o. F) Treating RN: [name] Primary Care Seville Brick: [name] Other Clinician: Referring Klair Leising: [name] Treating Ivianna Notch/Extender: [name] in Treatment: 15 Wound Status Wound Number: 3 Primary Etiology: Arterial Insufficiency Ulcer Wound Location: Right Metatarsal head fifth Wound Status: Open Wounding Event: Gradually Appeared Comorbid History: Arrhythmia, Osteoarthritis Date Acquired: 04/09/2019 Weeks Of Treatment: 15 Clustered Wound: No Photos Wound Measurements Length: (cm) 0.8 Width: (cm) 0.6 Depth: (cm) 0.3 Area: (cm) 0.377 Volume: (cm) 0.113 % Reduction in Area: 66.7% % Reduction in Volume: 0% Epithelialization: None Undermining: Yes Starting Position (o'clock): 12 Ending Position (o'clock): 12 Maximum Distance: (cm) 0.5 Wound Description Classification: Full Thickness Without Exposed Support Structures Wound Margin: Distinct, outline attached Exudate Amount: Medium Exudate Type: Serosanguineous Exudate Color: red, brown Foul Odor After Cleansing: No Slough/Fibrino Yes Wound Bed Granulation Amount: Small (1-33%) Exposed Structure Granulation Quality: Red Fascia Exposed: No Necrotic Amount: Large (67-100%) Fat Layer (Subcutaneous Tissue) Exposed: Yes Necrotic Quality: Adherent Slough Tendon Exposed: No Muscle Exposed: No Joint Exposed: No Bone Exposed: No Treatment Notes Wound #3 (Right Metatarsal head fifth) Notes scell, bordered foam dressing, heel cup, secured with stretch net, xeroform and bordered foam on arm. [name], [name] ([id number]) Electronic Signature(s) Signed: 01/31/2020 6:06:12 PM By: [name], BSN, RN, CWS, Kim RN,  Payne Entered By: [name], BSN, RN, CWS, [name] on 01/28/2020 13:14:41 [name], [name] ([id number]) -------------------------------------------------------------------------------- Wound Assessment Details Patient Name: [name], [name]. Date of Service: 01/28/2020 12:30 PM Medical Record Number: [id number] Patient Account Number: [id number] Date of Birth/Sex: 21-Mar-1930 (86 y.o. F) Treating RN: [name] Primary Care Staphany Ditton: [name] Other Clinician: Referring  Jacquiline Zurcher: [name] Treating Rajinder Mesick/Extender: [name] in Treatment: 15 Wound Status Wound Number: 4 Primary Etiology: Trauma, Other Wound Location: Left, Posterior Upper Arm Wound Status: Open Wounding Event: Trauma Comorbid History: Arrhythmia, Osteoarthritis Date Acquired: 01/17/2020 Weeks Of Treatment: 0 Clustered Wound: No Photos Wound Measurements Length: (cm) 1.2 Width: (cm) 1.1 Depth: (cm) 0.1 Area: (cm) 1.037 Volume: (cm) 0.104 % Reduction in Area: 0% % Reduction in Volume: 0% Tunneling: No Undermining: No Wound Description Classification: Partial Thickness Wound Margin: Indistinct, nonvisible Exudate Amount: Large Exudate Type: Sanguinous Exudate Color: red Foul Odor After Cleansing: No Slough/Fibrino No Wound Bed Granulation Amount: None Present (0%) Exposed Structure Necrotic Amount: None Present (0%) Fascia Exposed: No Fat Layer (Subcutaneous Tissue) Exposed: No Tendon Exposed: No Muscle Exposed: No Joint Exposed: No Bone Exposed: No Treatment Notes Wound #4 (Left, Posterior Upper Arm) Notes scell, bordered foam dressing, heel cup, secured with stretch net, xeroform and bordered foam on arm. Electronic Signature(s) Signed: 01/31/2020 6:06:12 PM By: [name] [address] [place] ([id number]) Entered By: [name], BSN, RN, CWS, [name] on 01/28/2020 13:14:20 LEORA, PLATT  ([id number]) -------------------------------------------------------------------------------- [place] Details Patient Name: TANARA, TURVEY. Date of Service: 01/28/2020 12:30 PM Medical Record Number: [id number] Patient Account Number: [id number] Date of Birth/Sex: 10-21-1929 (86 y.o. F) Treating RN: [name] Primary Care Heitor Steinhoff: [name] Other Clinician: Referring Eugina Row: [name] Treating Deavion Strider/Extender: [name] in Treatment: 15 Vital Signs Time Taken: 12:45 Temperature (F): 97.4 Height (in): 62 Pulse (bpm): 66 Weight (lbs): 140 Respiratory Rate (breaths/min): 18 Body Mass Index (BMI): 25.6 Blood Pressure (mmHg): 178/71 Reference Range: 80 - 120 mg / dl Electronic Signature(s) Signed: 01/28/2020 3:51:42 PM By: [name] RCP, RRT, CHT Entered By: [name] on 01/28/2020 12:50:17

## 2020-02-01 NOTE — Progress Notes (Signed)
Cardiology Office Note   Date:  02/01/2020   ID:  [name], DOB 06/22/1929, MRN [id number]  PCP:  Ria Bush, MD  Cardiologist:   [name], MD   Chief Complaint  Patient presents with  . Other    6 month follow up. Patient c.o swelling in left foot.  Patient states she fell and hurt her back.  Meds reviewed verbally with patient.      History of Present Illness: [name] is a 86 y.o. female who is here today for follow-up visit regarding persistent atrial fibrillation maintaining sinus rhythm with amiodarone, mild nonobstructive coronary artery disease and chronic diastolic heart failure.  Other medical problems include chronic dyspnea and fatigue, essential hypertension, hyperlipidemia, rheumatoid arthritis and peripheral arterial disease.   Most recent echocardiogram in February 2021 showed normal LV systolic function, grade 2 diastolic dysfunction, severe pulmonary hypertension, moderate mitral regurgitation and moderate tricuspid regurgitation. Right and left cardiac catheterization was done in April 2021.  It showed mild to moderate nonobstructive coronary artery disease.  Worst stenosis was 50% in the ostial right coronary artery which was not significant by fractional flow reserve.  Right heart catheterization at that time showed mildly elevated filling pressures, mild pulmonary hypertension and low normal cardiac output. She was hospitalized in July after a fall and fracture of the right wrist.  She had acute on chronic renal failure which improved after discontinuing furosemide.  She had lower extremity arterial angiogram done by Dr. Lucky Cowboy with endovascular intervention and tibial vessels bilaterally.  She has been doing reasonably well overall but she did have another fall recently and is complaining of back discomfort.  She continues to have dyspnea with minimal exertion without chest pain.  She has some swelling in the right leg that usually improves  with leg elevation.    Past Medical History:  Diagnosis Date  . Abdominal aortic atherosclerosis ([place]) 09/2015   by xray  . Anxiety   . BCC (basal cell carcinoma of skin) 05/2011   on nose  . Breast lesion    a. diagnosed with complex sclerosing lesion with calcifications of the right breast in 08/2016  . Chest pain    a. nuclear stress test 03/2012: normal; b. 09/2018 MV: EF >65%, no isch/scar.   . DDD (degenerative disc disease), lumbosacral 12/06/1992  . Gallstones 09/2015   incidentally by xray  . GERD (gastroesophageal reflux disease) 05/1999  . HERPES ZOSTER 04/13/2007   Qualifier: Diagnosis of  By: [name], [name]   . Hiatal hernia   . History of shingles    ophthalmic, takes acyclovir daily  . Hyperlipidemia 12/2003  . Hypertension 1990  . Mitral regurgitation    a. echo 03/2012: EF 60-65%, no RWMA, mild to mod AI/MR, mod TR, PASP 37 mmHg  . Osteoarthritis 08/21/1989  . Osteoporosis with fracture 11/1997   with compression fracture T12  . Persistent atrial fibrillation ([place]) 03/16/2012   a. CHADS2VASc => 5 (HTN, age x 2, vascular disease, sex category)-->Eliquis 5 BID;  b. Recurrent AF in 06/2016;  c. 01/2017 s/p DCCV;  d. 02/2017 recurrent AFib->Amio added->DCCV; e. 03/2017 recurrent AFib, s/p reload of amio and DCCV 04/02/2017  . POSTHERPETIC NEURALGIA 04/20/2007   Qualifier: Diagnosis of  By: [name], [name]   . Rheumatoid arthritis ([place])   . RLS (restless legs syndrome)     Past Surgical History:  Procedure Laterality Date  . ABDOMINAL HYSTERECTOMY  Age 62 - 1969  S/P TAH  . abdominal ultrasound  04/23/2004   Gallstones  . BREAST EXCISIONAL BIOPSY Right 2018   neg/benign complex sclerosing lesion  . BREAST LUMPECTOMY WITH RADIOACTIVE SEED LOCALIZATION Right 10/2016   breast lumpectomy with radioactive seed localization and margin assessment for complex sclerosing lesion [facility])  . BREAST LUMPECTOMY WITH RADIOACTIVE SEED LOCALIZATION  Right 11/05/2016   Procedure: RIGHT BREAST LUMPECTOMY WITH RADIOACTIVE SEED LOCALIZATION;  Surgeon: Fanny Skates, MD;  Location: [place];  Service: General;  Laterality: Right;  . CARDIAC CATHETERIZATION    . CARDIOVERSION N/A 01/31/2017   Procedure: CARDIOVERSION;  Surgeon: Wellington Hampshire, MD;  Location: [facility];  Service: Cardiovascular;  Laterality: N/A;  . CARDIOVERSION N/A 03/03/2017   Procedure: CARDIOVERSION;  Surgeon: Wellington Hampshire, MD;  Location: [place] [facility];  Service: Cardiovascular;  Laterality: N/A;  . CARDIOVERSION N/A 04/02/2017   Procedure: CARDIOVERSION;  Surgeon: Wellington Hampshire, MD;  Location: [facility];  Service: Cardiovascular;  Laterality: N/A;  . CARDIOVERSION N/A 01/16/2018   Procedure: CARDIOVERSION;  Surgeon: Wellington Hampshire, MD;  Location: [facility];  Service: Cardiovascular;  Laterality: N/A;  . CATARACT EXTRACTION  05/2010   left eye  . CERVICAL DISCECTOMY  1992   Fusion (Dr. Joya Salm)  . ESI Bilateral 01/2016   S1 transforaminal ESI x3  . ESOPHAGOGASTRODUODENOSCOPY  01/01/2002   with ulcer, bx. neg; + stricture gastric ulcer with hemorrhage  . ESOPHAGOGASTRODUODENOSCOPY  04/23/2004   Esophageal stricture -- dilated  . INTRAVASCULAR PRESSURE WIRE/FFR STUDY N/A 07/12/2019   Procedure: INTRAVASCULAR PRESSURE WIRE/FFR STUDY;  Surgeon: Wellington Hampshire, MD;  Location: [place] [facility];  Service: Cardiovascular;  Laterality: N/A;  . LOWER EXTREMITY ANGIOGRAPHY Left 02/01/2019   LOWER EXTREMITY ANGIOGRAPHY;  Surgeon: Algernon Huxley, MD  . LOWER EXTREMITY ANGIOGRAPHY Right 03/25/2019   Procedure: LOWER EXTREMITY ANGIOGRAPHY;  Surgeon: Algernon Huxley, MD;  Location: [place] [facility];  Service: Cardiovascular;  Laterality: Right;  . LOWER EXTREMITY ANGIOGRAPHY Right 08/02/2019   Procedure: LOWER EXTREMITY ANGIOGRAPHY;  Surgeon: Algernon Huxley, MD;  Location: [place] [facility];  Service: Cardiovascular;  Laterality: Right;  . nuclear stress  test  03/2012   no ischemia  . PERIPHERAL VASCULAR BALLOON ANGIOPLASTY Left 01/2019   Percutaneous transluminal angioplasty of left anterior and posterior tibial artery with 2.5 mm diameter by 22 cm length angioplasty balloon ([place])  . RIGHT/LEFT HEART CATH AND CORONARY ANGIOGRAPHY Bilateral 07/12/2019   Procedure: RIGHT/LEFT HEART CATH AND CORONARY ANGIOGRAPHY;  Surgeon: Wellington Hampshire, MD;  Location: [place] [facility];  Service: Cardiovascular;  Laterality: Bilateral;  . SKIN CANCER EXCISION  05/20/2011   BCC from nose  . US ECHOCARDIOGRAPHY  03/2012   in flutter, EF 60%, mild-mod aortic, mitral, tricuspid regurg, mildly dilated LA     Current Outpatient Medications  Medication Sig Dispense Refill  . acetaminophen (TYLENOL) 500 MG tablet Take 1,000 mg every 8 (eight) hours as needed by mouth for moderate pain.     Marland Kitchen acyclovir (ZOVIRAX) 400 MG tablet Take 400 mg 2 (two) times daily by mouth.     Marland Kitchen amiodarone (PACERONE) 100 MG tablet Take 1 tablet (100 mg total) by mouth daily.    Marland Kitchen aspirin EC 81 MG tablet Take 1 tablet (81 mg total) by mouth daily. 150 tablet 2  . atorvastatin (LIPITOR) 20 MG tablet TAKE 1 TABLET BY MOUTH ONCE DAILY (Patient taking differently: Take 20 mg by mouth daily. ) 90 tablet 1  .  collagenase (SANTYL) ointment Apply 1 application topically daily. 15 g 0  . DULoxetine (CYMBALTA) 60 MG capsule TAKE 1 CAPSULE BY MOUTH ONCE DAILY (Patient taking differently: Take 60 mg by mouth daily. ) 90 capsule 1  . ELIQUIS 2.5 MG TABS tablet TAKE 1 TABLET BY MOUTH TWICE DAILY (Patient taking differently: Take 2.5 mg by mouth 2 (two) times daily. ) 180 tablet 1  . ergocalciferol (VITAMIN D2) 1.25 MG (50000 UT) capsule Take 1 capsule (50,000 Units total) by mouth once a week. (Patient taking differently: Take 50,000 Units by mouth once a week. Saturday) 12 capsule 3  . gabapentin (NEURONTIN) 300 MG capsule Take 1 capsule (300 mg total) by mouth at bedtime.    . memantine (NAMENDA) 5  MG tablet Take 1 tablet (5 mg total) by mouth 2 (two) times daily. 60 tablet 1  . metoprolol tartrate (LOPRESSOR) 25 MG tablet TAKE 1/2 TABLET BY MOUTH TWICE DAILY 90 tablet 3  . pantoprazole (PROTONIX) 40 MG tablet TAKE 1 TABLET BY MOUTH EVERY DAY (Patient taking differently: Take 40 mg by mouth daily. ) 90 tablet 1  . polyethylene glycol (MIRALAX / GLYCOLAX) 17 g packet Take 17 g by mouth daily as needed for mild constipation. 14 each 0  . prednisoLONE acetate (PRED FORTE) 1 % ophthalmic suspension Place 1 drop into the left eye daily.     Marland Kitchen rOPINIRole (REQUIP) 1 MG tablet Take 2.5 tablets (2.5 mg total) by mouth at bedtime.    . sulfamethoxazole-trimethoprim (BACTRIM DS) 800-160 MG tablet Take 1 tablet by mouth 2 (two) times daily.    . traMADol (ULTRAM) 50 MG tablet Take 50 mg by mouth daily. Takes 2 tablets daily  0   No current facility-administered medications for this visit.    Allergies:   Penicillins    Social History:  The patient  reports that she quit smoking about 43 years ago. She has a 0.25 pack-year smoking history. She has never used smokeless tobacco. She reports that she does not drink alcohol and does not use drugs.   Family History:  The patient's family history includes Cancer in her brother; Diabetes in her brother, brother, brother, and sister; Emphysema in her mother; Heart disease in her brother and father; Hypertension in her brother; Stroke in her brother, father, and sister.    ROS:  Please see the history of present illness.   Otherwise, review of systems are positive for none.   All other systems are reviewed and negative.    PHYSICAL EXAM: VS:  BP (!) 150/74 (BP Location: Right Arm, Patient Position: Sitting, Cuff Size: Normal)   Pulse (!) 58   Ht 5\' 2"  (1.575 m)   Wt 134 lb 8 oz (61 kg)   SpO2 95%   BMI 24.60 kg/m  , BMI Body mass index is 24.6 kg/m. GEN: Well nourished, well developed, in no acute distress  HEENT: normal  Neck: no JVD, carotid  bruits, or masses Cardiac: Regular rate and rhythm.,no murmurs, rubs, or gallops, mild bilateral leg edema worse on the left side with chronic stasis dermatitis. Respiratory:  clear to auscultation bilaterally, normal work of breathing GI: soft, nontender, nondistended, + BS MS: no deformity or atrophy  Skin: warm and dry, no rash Neuro:  Strength and sensation are intact Psych: euthymic mood, full affect  EKG:  EKG is ordered today. The ekg ordered today demonstrates sinus bradycardia with minimal LVH   Recent Labs: 10/23/2019: ALT 18 10/27/2019: Magnesium 2.1 11/16/2019: Hemoglobin 13.3;  Platelets 301.0 12/27/2019: BUN 15; Creatinine, Ser 1.07; Potassium 4.0; Sodium 139    Lipid Panel    Component Value Date/Time   CHOL 134 04/28/2019 1331   TRIG 85.0 04/28/2019 1331   HDL 67.20 04/28/2019 1331   CHOLHDL 2 04/28/2019 1331   VLDL 17.0 04/28/2019 1331   LDLCALC 50 04/28/2019 1331   LDLDIRECT 173.1 02/19/2010 0908      Wt Readings from Last 3 Encounters:  02/01/20 134 lb 8 oz (61 kg)  01/19/20 133 lb 6 oz (60.5 kg)  12/27/19 136 lb 2 oz (61.7 kg)       No flowsheet data found.    ASSESSMENT AND PLAN:  1.   Chronic diastolic heart failure: Furosemide was discontinued in July due to acute on chronic kidney disease after a fall.  Her weight has been stable and if anything is down 2 pounds from September.  Clinically, she does not appear to be volume overloaded and thus I favor continuing without a diuretic for now.  If her weight starts going up again, we can consider using a small dose furosemide 20 mg once daily.   2. Essential hypertension: Blood pressure is mildly elevated.  She is no longer on lisinopril and currently on small dose metoprolol.  If blood pressure continues to be elevated, we should consider switching to carvedilol 6.25 mg twice daily.  3. Hyperlipidemia: Currently on atorvastatin 20 mg once daily with most recent LDL of 50.  4. Paroxysmal atrial  flutter and persistent atrial fibrillation: Maintaining in sinus rhythm with small dose amiodarone   Continue anticoagulation with Eliquis 2.5 mg twice daily.  Given improvement in renal function, with appropriate dose for her as 5 mg twice daily.  However, her weight is around 60 kg and also she has been having recurrent falls and thus I elected to keep her on 2.5 twice daily for now.  5.  Peripheral arterial disease: Status post tibial vessel intervention managed by Dr. Lucky Cowboy.  6.  Worsening exertional dyspnea: I do not really have a clear explanation from a cardiac standpoint.  Cardiac catheterization this year showed no obstructive coronary artery disease and her ejection fraction was normal.  She does have chronic diastolic heart failure but appears to be euvolemic and she is maintaining in sinus rhythm.  Pulmonary function testing can be considered given that she is on amiodarone although I doubt she will be able to give a good effort now given chronic pain complaints she is having.   Disposition:   FU with me in 4 months  Signed,  [name], MD  02/01/2020 3:58 PM    [place]

## 2020-02-01 NOTE — Progress Notes (Signed)
[name], [name] ([id number]) Visit Report for 01/28/2020 Fall Risk Assessment Details Patient Name: [name], [name]. Date of Service: 01/28/2020 12:30 PM Medical Record Number: [id number] Patient Account Number: [id number] Date of Birth/Sex: Feb 05, 1930 (86 y.o. F) Treating RN: Cornell Barman Primary Care Srikar Chiang: Denman George Other Clinician: Referring Talena Neira: Denman George Treating Cheyane Ayon/Extender: Skipper Cliche in Treatment: 15 Fall Risk Assessment Items Have you had 2 or more falls in the last 12 monthso 0 Yes Have you had any fall that resulted in injury in the last 12 monthso 0 Yes FALLS RISK SCREEN History of falling - immediate or within 3 months 25 Yes Secondary diagnosis (Do you have 2 or more medical diagnoseso) 0 No Ambulatory aid None/bed rest/wheelchair/nurse 0 No Crutches/cane/walker 15 Yes Furniture 0 No Intravenous therapy Access/Saline/Heparin Lock 0 No Gait/Transferring Normal/ bed rest/ wheelchair 0 No Weak (short steps with or without shuffle, stooped but able to lift head while walking, may 10 Yes seek support from furniture) Impaired (short steps with shuffle, may have difficulty arising from chair, head down, impaired 0 No balance) Mental Status Oriented to own ability 0 No Electronic Signature(s) Signed: 01/31/2020 6:06:12 PM By: Gretta Cool, BSN, RN, CWS, Kim RN, BSN Entered By: Gretta Cool, BSN, RN, CWS, Kim on 01/28/2020 13:19:57

## 2020-02-02 DIAGNOSIS — S41112A Laceration without foreign body of left upper arm, initial encounter: Secondary | ICD-10-CM | POA: Diagnosis not present

## 2020-02-04 DIAGNOSIS — E538 Deficiency of other specified B group vitamins: Secondary | ICD-10-CM | POA: Diagnosis not present

## 2020-02-04 DIAGNOSIS — M5136 Other intervertebral disc degeneration, lumbar region: Secondary | ICD-10-CM | POA: Diagnosis not present

## 2020-02-04 DIAGNOSIS — Z7901 Long term (current) use of anticoagulants: Secondary | ICD-10-CM | POA: Diagnosis not present

## 2020-02-04 DIAGNOSIS — I872 Venous insufficiency (chronic) (peripheral): Secondary | ICD-10-CM | POA: Diagnosis not present

## 2020-02-04 DIAGNOSIS — K219 Gastro-esophageal reflux disease without esophagitis: Secondary | ICD-10-CM | POA: Diagnosis not present

## 2020-02-04 DIAGNOSIS — E7849 Other hyperlipidemia: Secondary | ICD-10-CM | POA: Diagnosis not present

## 2020-02-04 DIAGNOSIS — I10 Essential (primary) hypertension: Secondary | ICD-10-CM | POA: Diagnosis not present

## 2020-02-04 DIAGNOSIS — M1990 Unspecified osteoarthritis, unspecified site: Secondary | ICD-10-CM | POA: Diagnosis not present

## 2020-02-04 DIAGNOSIS — I2510 Atherosclerotic heart disease of native coronary artery without angina pectoris: Secondary | ICD-10-CM | POA: Diagnosis not present

## 2020-02-04 DIAGNOSIS — I130 Hypertensive heart and chronic kidney disease with heart failure and stage 1 through stage 4 chronic kidney disease, or unspecified chronic kidney disease: Secondary | ICD-10-CM | POA: Diagnosis not present

## 2020-02-04 DIAGNOSIS — Z7952 Long term (current) use of systemic steroids: Secondary | ICD-10-CM | POA: Diagnosis not present

## 2020-02-04 DIAGNOSIS — S62102D Fracture of unspecified carpal bone, left wrist, subsequent encounter for fracture with routine healing: Secondary | ICD-10-CM | POA: Diagnosis not present

## 2020-02-04 DIAGNOSIS — I739 Peripheral vascular disease, unspecified: Secondary | ICD-10-CM | POA: Diagnosis not present

## 2020-02-04 DIAGNOSIS — N1831 Chronic kidney disease, stage 3a: Secondary | ICD-10-CM | POA: Diagnosis not present

## 2020-02-04 DIAGNOSIS — I272 Pulmonary hypertension, unspecified: Secondary | ICD-10-CM | POA: Diagnosis not present

## 2020-02-04 DIAGNOSIS — I5032 Chronic diastolic (congestive) heart failure: Secondary | ICD-10-CM | POA: Diagnosis not present

## 2020-02-04 DIAGNOSIS — M810 Age-related osteoporosis without current pathological fracture: Secondary | ICD-10-CM | POA: Diagnosis not present

## 2020-02-04 DIAGNOSIS — I4891 Unspecified atrial fibrillation: Secondary | ICD-10-CM | POA: Diagnosis not present

## 2020-02-04 DIAGNOSIS — G2581 Restless legs syndrome: Secondary | ICD-10-CM | POA: Diagnosis not present

## 2020-02-04 DIAGNOSIS — E559 Vitamin D deficiency, unspecified: Secondary | ICD-10-CM | POA: Diagnosis not present

## 2020-02-04 DIAGNOSIS — G9009 Other idiopathic peripheral autonomic neuropathy: Secondary | ICD-10-CM | POA: Diagnosis not present

## 2020-02-04 DIAGNOSIS — Z7982 Long term (current) use of aspirin: Secondary | ICD-10-CM | POA: Diagnosis not present

## 2020-02-04 DIAGNOSIS — L97513 Non-pressure chronic ulcer of other part of right foot with necrosis of muscle: Secondary | ICD-10-CM | POA: Diagnosis not present

## 2020-02-04 DIAGNOSIS — Z9181 History of falling: Secondary | ICD-10-CM | POA: Diagnosis not present

## 2020-02-07 ENCOUNTER — Telehealth: Payer: Self-pay

## 2020-02-07 ENCOUNTER — Telehealth: Payer: Self-pay | Admitting: Family Medicine

## 2020-02-07 DIAGNOSIS — I739 Peripheral vascular disease, unspecified: Secondary | ICD-10-CM | POA: Diagnosis not present

## 2020-02-07 DIAGNOSIS — S62102D Fracture of unspecified carpal bone, left wrist, subsequent encounter for fracture with routine healing: Secondary | ICD-10-CM | POA: Diagnosis not present

## 2020-02-07 DIAGNOSIS — K219 Gastro-esophageal reflux disease without esophagitis: Secondary | ICD-10-CM | POA: Diagnosis not present

## 2020-02-07 DIAGNOSIS — I272 Pulmonary hypertension, unspecified: Secondary | ICD-10-CM | POA: Diagnosis not present

## 2020-02-07 DIAGNOSIS — Z7901 Long term (current) use of anticoagulants: Secondary | ICD-10-CM | POA: Diagnosis not present

## 2020-02-07 DIAGNOSIS — Z7952 Long term (current) use of systemic steroids: Secondary | ICD-10-CM | POA: Diagnosis not present

## 2020-02-07 DIAGNOSIS — I130 Hypertensive heart and chronic kidney disease with heart failure and stage 1 through stage 4 chronic kidney disease, or unspecified chronic kidney disease: Secondary | ICD-10-CM | POA: Diagnosis not present

## 2020-02-07 DIAGNOSIS — Z7982 Long term (current) use of aspirin: Secondary | ICD-10-CM | POA: Diagnosis not present

## 2020-02-07 DIAGNOSIS — E7849 Other hyperlipidemia: Secondary | ICD-10-CM | POA: Diagnosis not present

## 2020-02-07 DIAGNOSIS — M810 Age-related osteoporosis without current pathological fracture: Secondary | ICD-10-CM | POA: Diagnosis not present

## 2020-02-07 DIAGNOSIS — I5032 Chronic diastolic (congestive) heart failure: Secondary | ICD-10-CM | POA: Diagnosis not present

## 2020-02-07 DIAGNOSIS — M5136 Other intervertebral disc degeneration, lumbar region: Secondary | ICD-10-CM | POA: Diagnosis not present

## 2020-02-07 DIAGNOSIS — E538 Deficiency of other specified B group vitamins: Secondary | ICD-10-CM | POA: Diagnosis not present

## 2020-02-07 DIAGNOSIS — Z9181 History of falling: Secondary | ICD-10-CM | POA: Diagnosis not present

## 2020-02-07 DIAGNOSIS — E559 Vitamin D deficiency, unspecified: Secondary | ICD-10-CM | POA: Diagnosis not present

## 2020-02-07 DIAGNOSIS — N1831 Chronic kidney disease, stage 3a: Secondary | ICD-10-CM | POA: Diagnosis not present

## 2020-02-07 DIAGNOSIS — I4891 Unspecified atrial fibrillation: Secondary | ICD-10-CM | POA: Diagnosis not present

## 2020-02-07 DIAGNOSIS — I872 Venous insufficiency (chronic) (peripheral): Secondary | ICD-10-CM | POA: Diagnosis not present

## 2020-02-07 DIAGNOSIS — G9009 Other idiopathic peripheral autonomic neuropathy: Secondary | ICD-10-CM | POA: Diagnosis not present

## 2020-02-07 DIAGNOSIS — G2581 Restless legs syndrome: Secondary | ICD-10-CM | POA: Diagnosis not present

## 2020-02-07 DIAGNOSIS — L97513 Non-pressure chronic ulcer of other part of right foot with necrosis of muscle: Secondary | ICD-10-CM | POA: Diagnosis not present

## 2020-02-07 DIAGNOSIS — M1990 Unspecified osteoarthritis, unspecified site: Secondary | ICD-10-CM | POA: Diagnosis not present

## 2020-02-07 DIAGNOSIS — I2510 Atherosclerotic heart disease of native coronary artery without angina pectoris: Secondary | ICD-10-CM | POA: Diagnosis not present

## 2020-02-07 DIAGNOSIS — I10 Essential (primary) hypertension: Secondary | ICD-10-CM | POA: Diagnosis not present

## 2020-02-07 MED ORDER — LISINOPRIL 5 MG PO TABS
5.0000 mg | ORAL_TABLET | Freq: Every day | ORAL | 6 refills | Status: DC
Start: 2020-02-07 — End: 2020-02-21

## 2020-02-07 NOTE — Telephone Encounter (Signed)
Signed and in lisa's box.

## 2020-02-07 NOTE — Addendum Note (Signed)
Addended by: Ria Bush on: 02/07/2020 01:58 PM   Modules accepted: Orders

## 2020-02-07 NOTE — Telephone Encounter (Signed)
[name] wanted to let you know she saw pt today and pt bp 210/81  Heart rate 53 2nd reading 188/84 heart rate 53  All other vitals are within normal range

## 2020-02-07 NOTE — Telephone Encounter (Signed)
BP staying too high. Please add lisinopril 5mg  daily sent to the pharmacy.  Call us in 1 wk with updated readings.   BP Readings from Last 3 Encounters:  02/01/20 (!) 150/74  01/19/20 140/90  12/27/19 (!) 154/72

## 2020-02-07 NOTE — Telephone Encounter (Signed)
Spoke with pt's daughter, [name] (on dpr), relaying Dr. Synthia Innocent message.  She verbalizes understanding.

## 2020-02-07 NOTE — Telephone Encounter (Signed)
Received faxed [place] form for wound care.  Placed order in Dr. Synthia Innocent box.

## 2020-02-08 NOTE — Telephone Encounter (Signed)
Form not in my box.  May have been placed in "Out" box.  Will watch to see if scanned.

## 2020-02-09 DIAGNOSIS — Z7952 Long term (current) use of systemic steroids: Secondary | ICD-10-CM | POA: Diagnosis not present

## 2020-02-09 DIAGNOSIS — G9009 Other idiopathic peripheral autonomic neuropathy: Secondary | ICD-10-CM | POA: Diagnosis not present

## 2020-02-09 DIAGNOSIS — I5032 Chronic diastolic (congestive) heart failure: Secondary | ICD-10-CM | POA: Diagnosis not present

## 2020-02-09 DIAGNOSIS — G2581 Restless legs syndrome: Secondary | ICD-10-CM | POA: Diagnosis not present

## 2020-02-09 DIAGNOSIS — E7849 Other hyperlipidemia: Secondary | ICD-10-CM | POA: Diagnosis not present

## 2020-02-09 DIAGNOSIS — S62102D Fracture of unspecified carpal bone, left wrist, subsequent encounter for fracture with routine healing: Secondary | ICD-10-CM | POA: Diagnosis not present

## 2020-02-09 DIAGNOSIS — E538 Deficiency of other specified B group vitamins: Secondary | ICD-10-CM | POA: Diagnosis not present

## 2020-02-09 DIAGNOSIS — L97513 Non-pressure chronic ulcer of other part of right foot with necrosis of muscle: Secondary | ICD-10-CM | POA: Diagnosis not present

## 2020-02-09 DIAGNOSIS — M810 Age-related osteoporosis without current pathological fracture: Secondary | ICD-10-CM | POA: Diagnosis not present

## 2020-02-09 DIAGNOSIS — M1990 Unspecified osteoarthritis, unspecified site: Secondary | ICD-10-CM | POA: Diagnosis not present

## 2020-02-09 DIAGNOSIS — Z7901 Long term (current) use of anticoagulants: Secondary | ICD-10-CM | POA: Diagnosis not present

## 2020-02-09 DIAGNOSIS — N1831 Chronic kidney disease, stage 3a: Secondary | ICD-10-CM | POA: Diagnosis not present

## 2020-02-09 DIAGNOSIS — I130 Hypertensive heart and chronic kidney disease with heart failure and stage 1 through stage 4 chronic kidney disease, or unspecified chronic kidney disease: Secondary | ICD-10-CM | POA: Diagnosis not present

## 2020-02-09 DIAGNOSIS — Z7982 Long term (current) use of aspirin: Secondary | ICD-10-CM | POA: Diagnosis not present

## 2020-02-09 DIAGNOSIS — I2510 Atherosclerotic heart disease of native coronary artery without angina pectoris: Secondary | ICD-10-CM | POA: Diagnosis not present

## 2020-02-09 DIAGNOSIS — I739 Peripheral vascular disease, unspecified: Secondary | ICD-10-CM | POA: Diagnosis not present

## 2020-02-09 DIAGNOSIS — M5136 Other intervertebral disc degeneration, lumbar region: Secondary | ICD-10-CM | POA: Diagnosis not present

## 2020-02-09 DIAGNOSIS — Z9181 History of falling: Secondary | ICD-10-CM | POA: Diagnosis not present

## 2020-02-09 DIAGNOSIS — I4891 Unspecified atrial fibrillation: Secondary | ICD-10-CM | POA: Diagnosis not present

## 2020-02-09 DIAGNOSIS — K219 Gastro-esophageal reflux disease without esophagitis: Secondary | ICD-10-CM | POA: Diagnosis not present

## 2020-02-09 DIAGNOSIS — I872 Venous insufficiency (chronic) (peripheral): Secondary | ICD-10-CM | POA: Diagnosis not present

## 2020-02-09 DIAGNOSIS — E559 Vitamin D deficiency, unspecified: Secondary | ICD-10-CM | POA: Diagnosis not present

## 2020-02-09 DIAGNOSIS — I10 Essential (primary) hypertension: Secondary | ICD-10-CM | POA: Diagnosis not present

## 2020-02-09 DIAGNOSIS — I272 Pulmonary hypertension, unspecified: Secondary | ICD-10-CM | POA: Diagnosis not present

## 2020-02-09 NOTE — Telephone Encounter (Signed)
Looks like form was faxed 02/07/20 and scanned into pt's chart.

## 2020-02-10 DIAGNOSIS — I739 Peripheral vascular disease, unspecified: Secondary | ICD-10-CM | POA: Diagnosis not present

## 2020-02-10 DIAGNOSIS — I272 Pulmonary hypertension, unspecified: Secondary | ICD-10-CM | POA: Diagnosis not present

## 2020-02-10 DIAGNOSIS — I130 Hypertensive heart and chronic kidney disease with heart failure and stage 1 through stage 4 chronic kidney disease, or unspecified chronic kidney disease: Secondary | ICD-10-CM | POA: Diagnosis not present

## 2020-02-10 DIAGNOSIS — M810 Age-related osteoporosis without current pathological fracture: Secondary | ICD-10-CM | POA: Diagnosis not present

## 2020-02-10 DIAGNOSIS — S62102D Fracture of unspecified carpal bone, left wrist, subsequent encounter for fracture with routine healing: Secondary | ICD-10-CM | POA: Diagnosis not present

## 2020-02-10 DIAGNOSIS — G9009 Other idiopathic peripheral autonomic neuropathy: Secondary | ICD-10-CM | POA: Diagnosis not present

## 2020-02-10 DIAGNOSIS — Z7982 Long term (current) use of aspirin: Secondary | ICD-10-CM | POA: Diagnosis not present

## 2020-02-10 DIAGNOSIS — E559 Vitamin D deficiency, unspecified: Secondary | ICD-10-CM | POA: Diagnosis not present

## 2020-02-10 DIAGNOSIS — Z7952 Long term (current) use of systemic steroids: Secondary | ICD-10-CM | POA: Diagnosis not present

## 2020-02-10 DIAGNOSIS — I10 Essential (primary) hypertension: Secondary | ICD-10-CM | POA: Diagnosis not present

## 2020-02-10 DIAGNOSIS — E538 Deficiency of other specified B group vitamins: Secondary | ICD-10-CM | POA: Diagnosis not present

## 2020-02-10 DIAGNOSIS — Z7901 Long term (current) use of anticoagulants: Secondary | ICD-10-CM | POA: Diagnosis not present

## 2020-02-10 DIAGNOSIS — K219 Gastro-esophageal reflux disease without esophagitis: Secondary | ICD-10-CM | POA: Diagnosis not present

## 2020-02-10 DIAGNOSIS — I4891 Unspecified atrial fibrillation: Secondary | ICD-10-CM | POA: Diagnosis not present

## 2020-02-10 DIAGNOSIS — I2510 Atherosclerotic heart disease of native coronary artery without angina pectoris: Secondary | ICD-10-CM | POA: Diagnosis not present

## 2020-02-10 DIAGNOSIS — M1990 Unspecified osteoarthritis, unspecified site: Secondary | ICD-10-CM | POA: Diagnosis not present

## 2020-02-10 DIAGNOSIS — I5032 Chronic diastolic (congestive) heart failure: Secondary | ICD-10-CM | POA: Diagnosis not present

## 2020-02-10 DIAGNOSIS — E7849 Other hyperlipidemia: Secondary | ICD-10-CM | POA: Diagnosis not present

## 2020-02-10 DIAGNOSIS — L97513 Non-pressure chronic ulcer of other part of right foot with necrosis of muscle: Secondary | ICD-10-CM | POA: Diagnosis not present

## 2020-02-10 DIAGNOSIS — M5136 Other intervertebral disc degeneration, lumbar region: Secondary | ICD-10-CM | POA: Diagnosis not present

## 2020-02-10 DIAGNOSIS — Z9181 History of falling: Secondary | ICD-10-CM | POA: Diagnosis not present

## 2020-02-10 DIAGNOSIS — G2581 Restless legs syndrome: Secondary | ICD-10-CM | POA: Diagnosis not present

## 2020-02-10 DIAGNOSIS — N1831 Chronic kidney disease, stage 3a: Secondary | ICD-10-CM | POA: Diagnosis not present

## 2020-02-10 DIAGNOSIS — I872 Venous insufficiency (chronic) (peripheral): Secondary | ICD-10-CM | POA: Diagnosis not present

## 2020-02-11 ENCOUNTER — Other Ambulatory Visit: Payer: Self-pay

## 2020-02-11 ENCOUNTER — Encounter: Payer: Medicare Other | Attending: Physician Assistant | Admitting: Physician Assistant

## 2020-02-11 DIAGNOSIS — I480 Paroxysmal atrial fibrillation: Secondary | ICD-10-CM | POA: Diagnosis not present

## 2020-02-11 DIAGNOSIS — L97512 Non-pressure chronic ulcer of other part of right foot with fat layer exposed: Secondary | ICD-10-CM | POA: Insufficient documentation

## 2020-02-11 DIAGNOSIS — M1990 Unspecified osteoarthritis, unspecified site: Secondary | ICD-10-CM | POA: Insufficient documentation

## 2020-02-11 DIAGNOSIS — I739 Peripheral vascular disease, unspecified: Secondary | ICD-10-CM | POA: Insufficient documentation

## 2020-02-11 DIAGNOSIS — Z7901 Long term (current) use of anticoagulants: Secondary | ICD-10-CM | POA: Diagnosis not present

## 2020-02-11 NOTE — Progress Notes (Addendum)
[name], [name] ([id number]) Visit Report for 02/11/2020 Chief Complaint Document Details Patient Name: [name], [name]. Date of Service: 02/11/2020 12:30 PM Medical Record Number: [id number] Patient Account Number: [id number] Date of Birth/Sex: [birth year] (86 y.o. F) Treating RN: Cornell Barman Primary Care Provider: Denman George Other Clinician: Referring Provider: Denman George Treating Provider/Extender: Skipper Cliche in Treatment: 17 Information Obtained from: Patient Chief Complaint Right Lateral Foot Ulcer Electronic Signature(s) Signed: 02/11/2020 1:02:15 PM By: Worthy Keeler PA-C Entered By: Worthy Keeler on 02/11/2020 13:02:15 [name], [name] ([id number]) -------------------------------------------------------------------------------- HPI Details Patient Name: [name] Date of Service: 02/11/2020 12:30 PM Medical Record Number: [id number] Patient Account Number: [id number] Date of Birth/Sex: 11-17-1929 (86 y.o. F) Treating RN: Cornell Barman Primary Care Provider: Denman George Other Clinician: Referring Provider: Denman George Treating Provider/Extender: Skipper Cliche in Treatment: 17 History of Present Illness HPI Description: 10/12/2019 upon evaluation today patient appears to be doing somewhat poorly in regard to her wound at this point. She has a ulcer over the right fifth metatarsal region. This has been managed by podiatry up to this point. Unfortunately the patient just does not seem to be making the progress that they were hoping for an subsequently the patient was referred to [place] by [facility] Dr. Posey Pronto. With that being said I did note that the patient does have peripheral vascular disease which has been managed by Dr. Lazaro Arms her most recent angiogram with intervention was on 08/02/2019. The ABIs following were on the right 1.30 with a TBI of 0.61 normal left 1.31 with a TBI of 0.58. Obviously things seem to be doing  somewhat better at this point hopefully enough to allow her to heal. She is on anticoagulant therapy due to atrial fibrillation long-term. With that being said the patient is unfortunately having quite a bit of discomfort at this time secondary to the wound. She also has erythema which is very likely secondary to infection. I am going obtain a culture of the area today once debridement is complete. Apparently the patient also had a x-ray that was performed by Dr. Posey Pronto on 09/21/2019 and this showed that the patient had no obvious signs on x-ray of cortical irregularity and no osteomyelitis obviously noted. With that being said as I discussed with the patient's daughter as well this does not indicate that the patient absolutely has no osteomyelitis as x-rays are only at best 50% helpful in identifying this complication. Nonetheless I do believe that we need to have the patient continue to have this further evaluated specifically I am to recommend that we check into an MRI which I think would be ideal to evaluate whether or not there is osteomyelitis present. 7/16; this is a patient with an ulcer over the right fifth metatarsal head. She follows with Dr. Lucky Cowboy [facility]. She is due to have an MRI of the foot on 7/26. Using Santyl to the wound 11/04/2019 on evaluation today patient appears to be doing okay in regard to her wound at this time. Unfortunately since I last saw her which was actually her second visit here in the clinic she has moved into a new apartment and then subsequently had a fall. She ended up in the hospital she did fracture her left arm and subsequently has also been dealing with a lot of dizziness she has been at [employer]. She tells me that getting in the change the dressing has been somewhat difficult as far as daily dressing  changes are concerned with the Santyl. Nonetheless when they do change it that is what is been used and her daughter-in-law has also been  coming in to help out as well with the dressing changes to try to make sure it gets done daily. Fortunately there is no signs of active infection at this time. No fever chills noted. I do believe the Santyl has been beneficial and is likely still the best way to go at this point for the patient. I did review the patient's MRI which showed some potential for reactive osteitis but did not show any signs of osteomyelitis overtly. That cannot be excluded but again right now it does not appear that is very likely present as well. This is good news. I also did review her culture she has been on the Bactrim she tells me she has taken this and again that was appropriate based on the results of her culture. 11/19/2019 upon evaluation today patient appears to be doing decently well in regard to her wound. Fortunately there is no signs of active infection at this time wound is not significantly larger in fact is roughly about the same at this point. With that being said there is not as much erythema as previously noted I think the infection seems to be under much better control which is great news. Unfortunately she has fallen since I last saw her and broken her left arm she has an appointment with them following the appointment with me. 8/25; patient was worked in acutely today at the request of home health and was concerned about her right heel, generalized erythema in her feet bilaterally. According to her daughter things have been getting worse over the last for 5 days but not precisely from a wound care point of view. Her wound is on the lateral part of the right fifth metatarsal head looks about the same as I remember this. She has developed dryness and flaking of the skin on her plantar feet. As well a very tender area localized over the insertion point of the Achilles tendon. She has restless leg syndrome. Does not specifically offload the area at night. She wears socks at home and does not really give me  a pressure on the right heel issue. I reviewed her vascular status. She underwent her last revascularization on 08/02/2019 by Dr. Lucky Cowboy. This included an angioplasty of the right peroneal artery as well as the right posterior tibial artery. Her last noninvasive arterial study was on 11/23/2019. On the right her ABI was 1.08 TBI of 0.48. On the left ABI at 1.21 with a TBI of 0.54. Family relates that Dr. Lucky Cowboy felt that he had done the best he could and this. We have been using Santyl to the one wound she has on the right lateral fifth metatarsal head. She does not have a new wound today which is the concern that letter coming into the clinic 12/09/2019 on evaluation today patient appears to be doing decently well in regard to her foot ulcer at this point. She does note that with the compression wrap that the home health nurse put on after contacting our clinic that she actually did much better and felt much better. Her heel is also improved. Fortunately there is no signs of active infection at this time. No fevers, chills, nausea, vomiting, or diarrhea. 12/24/2019 upon evaluation today patient appears to be doing somewhat poorly still in regard to her foot. Unfortunately this is just not showing the signs of improvement that I  would like to see it is a little bit cleaner but also appears to be erythematous and I believe there is infection noted at this point. We will get a need to address this in my opinion. I did actually recommend that we switch back to the Iodoflex as well apparently the home health nurse had switch this away that not really what we want to do at this point in my opinion. They were using silver alginate I think most recently. 01/06/2020 upon evaluation today patient actually appears to be doing much better in regard to her wound. We have switched back to the silver alginate after her home health nurse contacted me and honestly I feel like she is doing quite well in that regard. She does have  a lot of drainage and therefore I think that is probably a better option for. With that being said she is tolerating the compression wrap without complication that also seems to be helping. 01/28/2020 on evaluation today patient appears to be doing well with regard to her foot ulcer. I do feel like she is showing signs of improvement which is great news. There is no signs of active infection at this time which is also good news. No fevers, chills, nausea, vomiting, or diarrhea. Unfortunately she did have a fall and sustained an injury to the left upper arm. She has a skin tear here this appears to be doing okay although [name], [name]. ([id number]) the dressing was stuck to this this was an alginate dressing. I really think she probably needs something like Xeroform to try to prevent anything from sticking to this region. Other than that she seems to be doing quite well. 02/11/2020 on evaluation today patient's arm skin tear appears to be completely healed which is great. Unfortunately she is still having issues at this time with her foot although this seems to be showing signs of improvement it still very slow. There is no signs of active infection at this time. Electronic Signature(s) Signed: 02/11/2020 3:17:29 PM By: Worthy Keeler PA-C Entered By: Worthy Keeler on 02/11/2020 15:17:28 [name], [name] ([id number]) -------------------------------------------------------------------------------- Physical Exam Details Patient Name: [name], [name]. Date of Service: 02/11/2020 12:30 PM Medical Record Number: [id number] Patient Account Number: [id number] Date of Birth/Sex: 02-12-1930 (86 y.o. F) Treating RN: Cornell Barman Primary Care Provider: Denman George Other Clinician: Referring Provider: Denman George Treating Provider/Extender: Skipper Cliche in Treatment: 21 Constitutional Well-nourished and well-hydrated in no acute distress. Respiratory normal breathing without  difficulty. Psychiatric this patient is able to make decisions and demonstrates good insight into disease process. Alert and Oriented x 3. pleasant and cooperative. Notes His wound bed actually showed signs of good granulation at this time in regard to the foot and excellent epithelization in regard to the arm in fact the arm is closed. Overall I feel like she is making good progress and we are headed in the correct direction. Overall I think the patient's pain also is not really related to the wound is much as it is neuropathy. Electronic Signature(s) Signed: 02/11/2020 3:18:42 PM By: Worthy Keeler PA-C Entered By: Worthy Keeler on 02/11/2020 15:18:42 [name] ([id number]) -------------------------------------------------------------------------------- Physician Orders Details Patient Name: [name] Date of Service: 02/11/2020 12:30 PM Medical Record Number: [id number] Patient Account Number: [id number] Date of Birth/Sex: 1929-08-07 (86 y.o. F) Treating RN: Dolan Amen Primary Care Provider: Denman George Other Clinician: Referring Provider: Denman George Treating Provider/Extender: Skipper Cliche in Treatment: [phone] Verbal / Phone  Orders: No Diagnosis Coding ICD-10 Coding Code Description I73.89 Other specified peripheral vascular diseases L97.512 Non-pressure chronic ulcer of other part of right foot with fat layer exposed S41.102A Unspecified open wound of left upper arm, initial encounter Z79.01 Long term (current) use of anticoagulants I48.0 Paroxysmal atrial fibrillation B35.3 Tinea pedis Wound Cleansing Wound #3 Right Metatarsal head fifth o Clean wound with Normal Saline. o Dial antibacterial soap, wash wounds, rinse and pat dry prior to dressing wounds Primary Wound Dressing Wound #3 Right Metatarsal head fifth o Silver Alginate Secondary Dressing Wound #3 Right Metatarsal head fifth o Boardered Foam Dressing Dressing Change  Frequency Wound #3 Right Metatarsal head fifth o Other: - Tuesday and Friday. HH to change Tues and Friday weeks patient does not come to the [facility] and on opposite weeks [place] to changed on Tuesday and [place] changes on Friday. Follow-up Appointments Wound #3 Right Metatarsal head fifth o Return Appointment in 2 weeks. Additional Orders / Instructions Wound #3 Right Metatarsal head fifth o Increase protein intake. [place] Visits - [place] Nurse may visit PRN to address patientos wound care needs. o FACE TO FACE ENCOUNTER: MEDICARE and MEDICAID PATIENTS: I certify that this patient is under my care and that I had a face-to-face encounter that meets the physician face-to-face encounter requirements with this patient on this date. The encounter with the patient was in whole or in part for the following MEDICAL CONDITION: (primary reason for [place]) MEDICAL NECESSITY: I certify, that based on my findings, NURSING services are a medically necessary home health service. HOME BOUND STATUS: I certify that my clinical findings support that this patient is homebound (i.e., Due to illness or injury, pt requires aid of supportive devices such as crutches, cane, wheelchairs, walkers, the use of special transportation or the assistance of another person to leave their place of residence. There is a normal inability to leave the home and doing so requires considerable and taxing effort. Other absences are for medical reasons / religious services and are infrequent or of short duration when for other reasons). o If current dressing causes regression in wound condition, may D/C ordered dressing product/s and apply Normal Saline Moist Dressing daily until next [place] / Other MD appointment. [place] of regression in wound condition at [phone]. o Please direct any NON-WOUND related issues/requests for orders to  patient's Primary Care Physician TAHARA, RUFFINI ([id number]) Electronic Signature(s) Signed: 02/11/2020 2:52:33 PM By: Charlett Nose Signed: 02/11/2020 4:25:27 PM By: Worthy Keeler PA-C Entered By: Georges Mouse, Minus Breeding on 02/11/2020 13:14:11 [name], [name] ([id number]) -------------------------------------------------------------------------------- Problem List Details Patient Name: [name], [name]. Date of Service: 02/11/2020 12:30 PM Medical Record Number: [id number] Patient Account Number: [id number] Date of Birth/Sex: 05-27-1929 (86 y.o. F) Treating RN: Cornell Barman Primary Care Provider: Denman George Other Clinician: Referring Provider: Denman George Treating Provider/Extender: Skipper Cliche in Treatment: 17 Active Problems ICD-10 Encounter Code Description Active Date MDM Diagnosis I73.89 Other specified peripheral vascular diseases 10/12/2019 No Yes L97.512 Non-pressure chronic ulcer of other part of right foot with fat layer 10/12/2019 No Yes exposed S41.102A Unspecified open wound of left upper arm, initial encounter 01/28/2020 No Yes Z79.01 Long term (current) use of anticoagulants 10/12/2019 No Yes I48.0 Paroxysmal atrial fibrillation 10/12/2019 No Yes B35.3 Tinea pedis 12/01/2019 No Yes Inactive Problems Resolved Problems Electronic Signature(s) Signed: 02/11/2020 1:02:03 PM By: Worthy Keeler PA-C Entered By: Worthy Keeler on 02/11/2020  13:02:02 [name], [name] ([id number]) -------------------------------------------------------------------------------- Progress Note Details Patient Name: [name], [name]. Date of Service: 02/11/2020 12:30 PM Medical Record Number: [id number] Patient Account Number: [id number] Date of Birth/Sex: [birth year] (86 y.o. F) Treating RN: Cornell Barman Primary Care Provider: Denman George Other Clinician: Referring Provider: Denman George Treating Provider/Extender: Skipper Cliche in Treatment:  17 Subjective Chief Complaint Information obtained from Patient Right Lateral Foot Ulcer History of Present Illness (HPI) 10/12/2019 upon evaluation today patient appears to be doing somewhat poorly in regard to her wound at this point. She has a ulcer over the right fifth metatarsal region. This has been managed by podiatry up to this point. Unfortunately the patient just does not seem to be making the progress that they were hoping for an subsequently the patient was referred to [place] by [facility] Dr. Posey Pronto. With that being said I did note that the patient does have peripheral vascular disease which has been managed by Dr. Lazaro Arms her most recent angiogram with intervention was on 08/02/2019. The ABIs following were on the right 1.30 with a TBI of 0.61 normal left 1.31 with a TBI of 0.58. Obviously things seem to be doing somewhat better at this point hopefully enough to allow her to heal. She is on anticoagulant therapy due to atrial fibrillation long-term. With that being said the patient is unfortunately having quite a bit of discomfort at this time secondary to the wound. She also has erythema which is very likely secondary to infection. I am going obtain a culture of the area today once debridement is complete. Apparently the patient also had a x-ray that was performed by Dr. Posey Pronto on 09/21/2019 and this showed that the patient had no obvious signs on x-ray of cortical irregularity and no osteomyelitis obviously noted. With that being said as I discussed with the patient's daughter as well this does not indicate that the patient absolutely has no osteomyelitis as x-rays are only at best 50% helpful in identifying this complication. Nonetheless I do believe that we need to have the patient continue to have this further evaluated specifically I am to recommend that we check into an MRI which I think would be ideal to evaluate whether or not there is osteomyelitis present. 7/16; this is  a patient with an ulcer over the right fifth metatarsal head. She follows with Dr. Lucky Cowboy at [facility]. She is due to have an MRI of the foot on 7/26. Using Santyl to the wound 11/04/2019 on evaluation today patient appears to be doing okay in regard to her wound at this time. Unfortunately since I last saw her which was actually her second visit here in the clinic she has moved into a new apartment and then subsequently had a fall. She ended up in the hospital she did fracture her left arm and subsequently has also been dealing with a lot of dizziness she has been at [employer]. She tells me that getting in the change the dressing has been somewhat difficult as far as daily dressing changes are concerned with the Santyl. Nonetheless when they do change it that is what is been used and her daughter-in-law has also been coming in to help out as well with the dressing changes to try to make sure it gets done daily. Fortunately there is no signs of active infection at this time. No fever chills noted. I do believe the Santyl has been beneficial and is likely still the best way to go at  this point for the patient. I did review the patient's MRI which showed some potential for reactive osteitis but did not show any signs of osteomyelitis overtly. That cannot be excluded but again right now it does not appear that is very likely present as well. This is good news. I also did review her culture she has been on the Bactrim she tells me she has taken this and again that was appropriate based on the results of her culture. 11/19/2019 upon evaluation today patient appears to be doing decently well in regard to her wound. Fortunately there is no signs of active infection at this time wound is not significantly larger in fact is roughly about the same at this point. With that being said there is not as much erythema as previously noted I think the infection seems to be under much better control which is  great news. Unfortunately she has fallen since I last saw her and broken her left arm she has an appointment with them following the appointment with me. 8/25; patient was worked in acutely today at the request of home health and was concerned about her right heel, generalized erythema in her feet bilaterally. According to her daughter things have been getting worse over the last for 5 days but not precisely from a wound care point of view. Her wound is on the lateral part of the right fifth metatarsal head looks about the same as I remember this. She has developed dryness and flaking of the skin on her plantar feet. As well a very tender area localized over the insertion point of the Achilles tendon. She has restless leg syndrome. Does not specifically offload the area at night. She wears socks at home and does not really give me a pressure on the right heel issue. I reviewed her vascular status. She underwent her last revascularization on 08/02/2019 by Dr. Lucky Cowboy. This included an angioplasty of the right peroneal artery as well as the right posterior tibial artery. Her last noninvasive arterial study was on 11/23/2019. On the right her ABI was 1.08 TBI of 0.48. On the left ABI at 1.21 with a TBI of 0.54. Family relates that Dr. Lucky Cowboy felt that he had done the best he could and this. We have been using Santyl to the one wound she has on the right lateral fifth metatarsal head. She does not have a new wound today which is the concern that letter coming into the clinic 12/09/2019 on evaluation today patient appears to be doing decently well in regard to her foot ulcer at this point. She does note that with the compression wrap that the home health nurse put on after contacting our clinic that she actually did much better and felt much better. Her heel is also improved. Fortunately there is no signs of active infection at this time. No fevers, chills, nausea, vomiting, or diarrhea. 12/24/2019 upon evaluation  today patient appears to be doing somewhat poorly still in regard to her foot. Unfortunately this is just not showing the signs of improvement that I would like to see it is a little bit cleaner but also appears to be erythematous and I believe there is infection noted at this point. We will get a need to address this in my opinion. I did actually recommend that we switch back to the Iodoflex as well apparently the home health nurse had switch this away that not really what we want to do at this point in my opinion. They were using silver  alginate I think most recently. 01/06/2020 upon evaluation today patient actually appears to be doing much better in regard to her wound. We have switched back to the silver alginate after her home health nurse contacted me and honestly I feel like she is doing quite well in that regard. She does have a lot of drainage and therefore I think that is probably a better option for. With that being said she is tolerating the compression wrap without complication that [name], [name]. ([id number]) also seems to be helping. 01/28/2020 on evaluation today patient appears to be doing well with regard to her foot ulcer. I do feel like she is showing signs of improvement which is great news. There is no signs of active infection at this time which is also good news. No fevers, chills, nausea, vomiting, or diarrhea. Unfortunately she did have a fall and sustained an injury to the left upper arm. She has a skin tear here this appears to be doing okay although the dressing was stuck to this this was an alginate dressing. I really think she probably needs something like Xeroform to try to prevent anything from sticking to this region. Other than that she seems to be doing quite well. 02/11/2020 on evaluation today patient's arm skin tear appears to be completely healed which is great. Unfortunately she is still having issues at this time with her foot although this seems to be  showing signs of improvement it still very slow. There is no signs of active infection at this time. Objective Constitutional Well-nourished and well-hydrated in no acute distress. Vitals Time Taken: 12:42 PM, Height: 62 in, Weight: 140 lbs, BMI: 25.6, Temperature: 97.8 F, Pulse: 62 bpm, Respiratory Rate: 18 breaths/min, Blood Pressure: 173/72 mmHg. Respiratory normal breathing without difficulty. Psychiatric this patient is able to make decisions and demonstrates good insight into disease process. Alert and Oriented x 3. pleasant and cooperative. General Notes: His wound bed actually showed signs of good granulation at this time in regard to the foot and excellent epithelization in regard to the arm in fact the arm is closed. Overall I feel like she is making good progress and we are headed in the correct direction. Overall I think the patient's pain also is not really related to the wound is much as it is neuropathy. Integumentary (Hair, Skin) Wound #3 status is Open. Original cause of wound was Gradually Appeared. The wound is located on the Right Metatarsal head fifth. The wound measures 1.1cm length x 0.9cm width x 0.3cm depth; 0.778cm^2 area and 0.233cm^3 volume. There is Fat Layer (Subcutaneous Tissue) exposed. There is no tunneling or undermining noted. There is a medium amount of serosanguineous drainage noted. The wound margin is distinct with the outline attached to the wound base. There is small (1-33%) red granulation within the wound bed. There is a large (67-100%) amount of necrotic tissue within the wound bed including Adherent Slough. Wound #4 status is Healed - Epithelialized. Original cause of wound was Trauma. The wound is located on the Left,Posterior Upper Arm. The wound measures 0cm length x 0cm width x 0cm depth; 0cm^2 area and 0cm^3 volume. There is no tunneling or undermining noted. There is a none present amount of drainage noted. The wound margin is indistinct and  nonvisible. There is no granulation within the wound bed. There is no necrotic tissue within the wound bed. Assessment Active Problems ICD-10 Other specified peripheral vascular diseases Non-pressure chronic ulcer of other part of right foot with fat layer exposed  Unspecified open wound of left upper arm, initial encounter Long term (current) use of anticoagulants Paroxysmal atrial fibrillation Tinea pedis Plan Wound Cleansing: Wound #3 Right Metatarsal head fifth: [name], [name]. ([id number]) Clean wound with Normal Saline. Dial antibacterial soap, wash wounds, rinse and pat dry prior to dressing wounds Primary Wound Dressing: Wound #3 Right Metatarsal head fifth: Silver Alginate Secondary Dressing: Wound #3 Right Metatarsal head fifth: Boardered Foam Dressing Dressing Change Frequency: Wound #3 Right Metatarsal head fifth: Other: - Tuesday and Friday. HH to change Tues and Friday weeks patient does not come to the [facility] and on opposite weeks [place] to changed on Tuesday and [facility] changes on Friday. Follow-up Appointments: Wound #3 Right Metatarsal head fifth: Return Appointment in 2 weeks. Additional Orders / Instructions: Wound #3 Right Metatarsal head fifth: Increase protein intake. Home Health: [facility] Visits - [place] Nurse may visit PRN to address patient s wound care needs. FACE TO FACE ENCOUNTER: MEDICARE and MEDICAID PATIENTS: I certify that this patient is under my care and that I had a face-to-face encounter that meets the physician face-to-face encounter requirements with this patient on this date. The encounter with the patient was in whole or in part for the following MEDICAL CONDITION: (primary reason for [place]) MEDICAL NECESSITY: I certify, that based on my findings, NURSING services are a medically necessary home health service. HOME BOUND STATUS: I certify that my clinical findings support that this patient is homebound (i.e.,  Due to illness or injury, pt requires aid of supportive devices such as crutches, cane, wheelchairs, walkers, the use of special transportation or the assistance of another person to leave their place of residence. There is a normal inability to leave the home and doing so requires considerable and taxing effort. Other absences are for medical reasons / religious services and are infrequent or of short duration when for other reasons). If current dressing causes regression in wound condition, may D/C ordered dressing product/s and apply Normal Saline Moist Dressing daily until next [place] / Other MD appointment. [place] of regression in wound condition at [phone]. Please direct any NON-WOUND related issues/requests for orders to patient's Primary Care Physician 1. I would recommend currently that we go and continue with the wound care measures in regard to the foot ulcer were using silver alginate I think that still the best way to go we see new granulation noted in the base of the wound obviously I want that to continue. 2. I am also can recommend the patient continue with appropriate offloading to keep anything pressure wise from getting to the area that caused this to worsen. 3. I am also going to suggest the patient continue to monitor for infection obviously right now it does not appear to be infected if anything changes they should let me know such as increased pain at the wound site itself, increased drainage, fever, chills, nausea, vomiting, or diarrhea. We will see patient back for reevaluation in 2 weeks here in the clinic. If anything worsens or changes patient will contact our office for additional recommendations. Electronic Signature(s) Signed: 02/11/2020 3:19:21 PM By: Worthy Keeler PA-C Entered By: Worthy Keeler on 02/11/2020 15:19:20 [name]  ([id number]) -------------------------------------------------------------------------------- SuperBill Details Patient Name: [name] Date of Service: 02/11/2020 Medical Record Number: [id number] Patient Account Number: [id number] Date of Birth/Sex: November 27, 1929 (86 y.o. F) Treating RN: Cornell Barman Primary Care Provider: Denman George Other Clinician: Referring Provider: Denman George Treating  Provider/Extender: Jeri Cos Weeks in Treatment: 17 Diagnosis Coding ICD-10 Codes Code Description I73.89 Other specified peripheral vascular diseases L97.512 Non-pressure chronic ulcer of other part of right foot with fat layer exposed S41.102A Unspecified open wound of left upper arm, initial encounter Z79.01 Long term (current) use of anticoagulants I48.0 Paroxysmal atrial fibrillation B35.3 Tinea pedis Facility Procedures CPT4 Code: 44695072 Description: 99213 - WOUND CARE VISIT-LEV 3 EST PT Modifier: Quantity: 1 Physician Procedures CPT4 Code: 2575051 Description: 83358 - WC PHYS LEVEL 3 - EST PT Modifier: Quantity: 1 CPT4 Code: Description: ICD-10 Diagnosis Description I73.89 Other specified peripheral vascular diseases L97.512 Non-pressure chronic ulcer of other part of right foot with fat layer e S41.102A Unspecified open wound of left upper arm, initial encounter Z79.01 Long  term (current) use of anticoagulants Modifier: xposed Quantity: Electronic Signature(s) Signed: 02/11/2020 3:19:36 PM By: Worthy Keeler PA-C Entered By: Worthy Keeler on 02/11/2020 15:19:35

## 2020-02-11 NOTE — Progress Notes (Signed)
[name], [name] ([id number]) Visit Report for 02/11/2020 Arrival Information Details Patient Name: [name], [name]. Date of Service: 02/11/2020 12:30 PM Medical Record Number: [id number] Patient Account Number: [id number] Date of Birth/Sex: [birth year] (86 y.o. F) Treating RN: Dolan Amen Primary Care Lou Loewe: Denman George Other Clinician: Referring Broedy Osbourne: Denman George Treating Pachia Strum/Extender: Skipper Cliche in Treatment: 21 Visit Information History Since Last Visit Pain Present Now: Yes Patient Arrived: [place] Arrival Time: 12:40 Accompanied By: daughter Transfer Assistance: None Patient Identification Verified: Yes Secondary Verification Process Completed: Yes Patient Requires Transmission-Based No Precautions: Patient Has Alerts: Yes Patient Alerts: 08/31/19 ABI R)1.3 L) 1.31 08/31/19 TBI R).61 L).58 Electronic Signature(s) Signed: 02/11/2020 2:52:33 PM By: Georges Mouse, Minus Breeding Entered By: Georges Mouse, Minus Breeding on 02/11/2020 12:46:32 [name] ([id number]) -------------------------------------------------------------------------------- Clinic Level of Care Assessment Details Patient Name: [name], [name]. Date of Service: 02/11/2020 12:30 PM Medical Record Number: [id number] Patient Account Number: [id number] Date of Birth/Sex: [birth year] (86 y.o. F) Treating RN: Dolan Amen Primary Care Nareh Matzke: Denman George Other Clinician: Referring Zarek Relph: Denman George Treating Ramar Nobrega/Extender: Skipper Cliche in Treatment: 17 Clinic Level of Care Assessment Items TOOL 4 Quantity Score []  - Use when only an EandM is performed on FOLLOW-UP visit 0 ASSESSMENTS - Nursing Assessment / Reassessment X - Reassessment of Co-morbidities (includes updates in patient status) 1 10 X- 1 5 Reassessment of Adherence to Treatment Plan ASSESSMENTS - Wound and Skin Assessment / Reassessment X - Simple Wound Assessment / Reassessment - one wound  1 5 []  - 0 Complex Wound Assessment / Reassessment - multiple wounds []  - 0 Dermatologic / Skin Assessment (not related to wound area) ASSESSMENTS - Focused Assessment []  - Circumferential Edema Measurements - multi extremities 0 []  - 0 Nutritional Assessment / Counseling / Intervention []  - 0 Lower Extremity Assessment (monofilament, tuning fork, pulses) []  - 0 Peripheral Arterial Disease Assessment (using hand held doppler) ASSESSMENTS - Ostomy and/or Continence Assessment and Care []  - Incontinence Assessment and Management 0 []  - 0 Ostomy Care Assessment and Management (repouching, etc.) PROCESS - Coordination of Care X - Simple Patient / Family Education for ongoing care 1 15 []  - 0 Complex (extensive) Patient / Family Education for ongoing care X- 1 10 Staff obtains Consents, Records, Test Results / Process Orders []  - 0 Staff telephones HHA, Nursing Homes / Clarify orders / etc []  - 0 Routine Transfer to another Facility (non-emergent condition) []  - 0 Routine Hospital Admission (non-emergent condition) []  - 0 New Admissions / Biomedical engineer / Ordering NPWT, Apligraf, etc. []  - 0 Emergency Hospital Admission (emergent condition) X- 1 10 Simple Discharge Coordination []  - 0 Complex (extensive) Discharge Coordination PROCESS - Special Needs []  - Pediatric / Minor Patient Management 0 []  - 0 Isolation Patient Management []  - 0 Hearing / Language / Visual special needs []  - 0 Assessment of Community assistance (transportation, D/C planning, etc.) []  - 0 Additional assistance / Altered mentation []  - 0 Support Surface(s) Assessment (bed, cushion, seat, etc.) INTERVENTIONS - Wound Cleansing / Measurement [name], [name]. ([id number]) X- 1 5 Simple Wound Cleansing - one wound []  - 0 Complex Wound Cleansing - multiple wounds X- 1 5 Wound Imaging (photographs - any number of wounds) []  - 0 Wound Tracing (instead of photographs) X- 1 5 Simple Wound  Measurement - one wound []  - 0 Complex Wound Measurement - multiple wounds INTERVENTIONS - Wound Dressings []  - Small Wound Dressing one or multiple wounds 0 X- 1 15 Medium Wound Dressing  one or multiple wounds []  - 0 Large Wound Dressing one or multiple wounds []  - 0 Application of Medications - topical []  - 0 Application of Medications - injection INTERVENTIONS - Miscellaneous []  - External ear exam 0 []  - 0 Specimen Collection (cultures, biopsies, blood, body fluids, etc.) []  - 0 Specimen(s) / Culture(s) sent or taken to Lab for analysis []  - 0 Patient Transfer (multiple staff / Civil Service fast streamer / Similar devices) []  - 0 Simple Staple / Suture removal (25 or less) []  - 0 Complex Staple / Suture removal (26 or more) []  - 0 Hypo / Hyperglycemic Management (close monitor of Blood Glucose) []  - 0 Ankle / Brachial Index (ABI) - do not check if billed separately X- 1 5 Vital Signs Has the patient been seen at the hospital within the last three years: Yes Total Score: 90 Level Of Care: New/Established - Level 3 Electronic Signature(s) Signed: 02/11/2020 2:52:33 PM By: Georges Mouse, Minus Breeding Entered By: Georges Mouse, Minus Breeding on 02/11/2020 13:15:10 [name] ([id number]) -------------------------------------------------------------------------------- Encounter Discharge Information Details Patient Name: [name] Date of Service: 02/11/2020 12:30 PM Medical Record Number: [id number] Patient Account Number: [id number] Date of Birth/Sex: [birth year] (86 y.o. F) Treating RN: Dolan Amen Primary Care Lavanya Roa: Denman George Other Clinician: Referring Channelle Bottger: Denman George Treating Mishael Krysiak/Extender: Skipper Cliche in Treatment: 17 Encounter Discharge Information Items Discharge Condition: Stable Ambulatory Status: Walker Discharge Destination: Home Transportation: Private Auto Accompanied By: caregiver Schedule Follow-up Appointment: Yes Clinical  Summary of Care: Electronic Signature(s) Signed: 02/11/2020 2:52:33 PM By: Georges Mouse, Minus Breeding Entered By: Georges Mouse, Minus Breeding on 02/11/2020 13:17:05 [name] ([id number]) -------------------------------------------------------------------------------- Lower Extremity Assessment Details Patient Name: [name] Date of Service: 02/11/2020 12:30 PM Medical Record Number: [id number] Patient Account Number: [id number] Date of Birth/Sex: 1930/03/12 (86 y.o. F) Treating RN: Dolan Amen Primary Care Elizjah Noblet: Denman George Other Clinician: Referring Rhys Lichty: Denman George Treating Mateja Dier/Extender: Jeri Cos Weeks in Treatment: 17 Edema Assessment Assessed: [Left: No] [name]: Yes] [Left: Edema] [Right: :] Vascular Assessment Pulses: Dorsalis Pedis Palpable: [Right:Yes] Electronic Signature(s) Signed: 02/11/2020 2:52:33 PM By: Georges Mouse, Minus Breeding Entered By: Georges Mouse, Minus Breeding on 02/11/2020 13:00:50 [name] ([id number]) -------------------------------------------------------------------------------- Multi Wound Chart Details Patient Name: [name]. Date of Service: 02/11/2020 12:30 PM Medical Record Number: [id number] Patient Account Number: [id number] Date of Birth/Sex: 1929-09-29 (86 y.o. F) Treating RN: Dolan Amen Primary Care Horice Carrero: Denman George Other Clinician: Referring Keelan Pomerleau: Denman George Treating Hodari Chuba/Extender: Skipper Cliche in Treatment: 17 Vital Signs Height(in): 62 Pulse(bpm): 61 Weight(lbs): 140 Blood Pressure(mmHg): 173/72 Body Mass Index(BMI): 26 Temperature(F): 97.8 Respiratory Rate(breaths/min): 18 Photos: [N/A:N/A] Wound Location: Right Metatarsal head fifth Left, Posterior Upper Arm N/A Wounding Event: Gradually Appeared Trauma N/A Primary Etiology: Arterial Insufficiency Ulcer Trauma, Other N/A Comorbid History: Arrhythmia, Osteoarthritis Arrhythmia, Osteoarthritis  N/A Date Acquired: 04/09/2019 01/17/2020 N/A Weeks of Treatment: 17 2 N/A Wound Status: Open Healed - Epithelialized N/A Measurements L x W x D (cm) 1.1x0.9x0.3 0x0x0 N/A Area (cm) : 0.778 0 N/A Volume (cm) : 0.233 0 N/A % Reduction in Area: 31.20% 100.00% N/A % Reduction in Volume: -106.20% 100.00% N/A Classification: Full Thickness Without Exposed Partial Thickness N/A Support Structures Exudate Amount: Medium None Present N/A Exudate Type: Serosanguineous N/A N/A Exudate Color: red, brown N/A N/A Wound Margin: Distinct, outline attached Indistinct, nonvisible N/A Granulation Amount: Small (1-33%) None Present (0%) N/A Granulation Quality: Red N/A N/A Necrotic Amount: Large (67-100%) None Present (0%) N/A Exposed Structures: Fat Layer (Subcutaneous Tissue): Fascia:  No N/A Yes Fat Layer (Subcutaneous Tissue): Fascia: No No Tendon: No Tendon: No Muscle: No Muscle: No Joint: No Joint: No Bone: No Bone: No Epithelialization: None N/A N/A Treatment Notes Electronic Signature(s) Signed: 02/11/2020 2:52:33 PM By: Georges Mouse, Minus Breeding Entered By: Georges Mouse, Minus Breeding on 02/11/2020 13:12:19 [name] ([id number]) -------------------------------------------------------------------------------- [place] Details Patient Name: [name]. Date of Service: 02/11/2020 12:30 PM Medical Record Number: [id number] Patient Account Number: [id number] Date of Birth/Sex: April 06, 1930 (86 y.o. F) Treating RN: Dolan Amen Primary Care Chayil Gantt: Denman George Other Clinician: Referring Sharisa Toves: Denman George Treating Jayceon Troy/Extender: Skipper Cliche in Treatment: 17 Active Inactive Abuse / Safety / Falls / Self Care Management Nursing Diagnoses: Potential for falls Goals: Patient/caregiver will verbalize understanding of skin care regimen Date Initiated: 10/12/2019 Target Resolution Date: 03/10/2020 Goal Status:  Active Interventions: Assess fall risk on admission and as needed Notes: Necrotic Tissue Nursing Diagnoses: Impaired tissue integrity related to necrotic/devitalized tissue Goals: Necrotic/devitalized tissue will be minimized in the wound bed Date Initiated: 10/12/2019 Target Resolution Date: 03/10/2020 Goal Status: Active Interventions: Assess patient pain level pre-, during and post procedure and prior to discharge Treatment Activities: Enzymatic debridement : 10/12/2019 Notes: Pain, Acute or Chronic Nursing Diagnoses: Potential alteration in comfort, pain Goals: Patient will verbalize adequate pain control and receive pain control interventions during procedures as needed Date Initiated: 10/12/2019 Target Resolution Date: 03/10/2020 Goal Status: Active Interventions: Complete pain assessment as per visit requirements Treatment Activities: Administer pain control measures as ordered : 10/12/2019 Notes: Peripheral Neuropathy Nursing Diagnoses: Knowledge deficit related to disease process and management of peripheral neurovascular dysfunction [name], [name]. ([id number]) Potential alteration in peripheral tissue perfusion (select prior to confirmation of diagnosis) Goals: Patient/caregiver will verbalize understanding of disease process and disease management Date Initiated: 10/12/2019 Target Resolution Date: 03/10/2020 Goal Status: Active Interventions: Assess signs and symptoms of neuropathy upon admission and as needed Provide education on Management of Neuropathy and Related Ulcers Notes: Electronic Signature(s) Signed: 02/11/2020 2:52:33 PM By: Georges Mouse, Minus Breeding Entered By: Georges Mouse, Minus Breeding on 02/11/2020 13:09:51 [name] ([id number]) -------------------------------------------------------------------------------- Pain Assessment Details Patient Name: [name]. Date of Service: 02/11/2020 12:30 PM Medical Record Number: [id number] Patient Account  Number: [id number] Date of Birth/Sex: Feb 06, 1930 (86 y.o. F) Treating RN: Dolan Amen Primary Care Tashunda Vandezande: Denman George Other Clinician: Referring Kelson Queenan: Denman George Treating Lamyiah Crawshaw/Extender: Skipper Cliche in Treatment: 17 Active Problems Location of Pain Severity and Description of Pain Patient Has Paino Yes Site Locations Pain Location: Generalized Pain Duration of the Pain. Constant / Intermittento Constant Rate the pain. Current Pain Level: 8 Pain Management and Medication Current Pain Management: Electronic Signature(s) Signed: 02/11/2020 2:52:33 PM By: Georges Mouse, Kenia Entered By: Georges Mouse, Minus Breeding on 02/11/2020 12:46:24 [name] ([id number]) -------------------------------------------------------------------------------- Patient/Caregiver Education Details Patient Name: [name] Date of Service: 02/11/2020 12:30 PM Medical Record Number: [id number] Patient Account Number: [id number] Date of Birth/Gender: [birth year] (86 y.o. F) Treating RN: Dolan Amen Primary Care Physician: Denman George Other Clinician: Referring Physician: Denman George Treating Physician/Extender: Skipper Cliche in Treatment: 3 Education Assessment Education Provided To: Patient and Caregiver Education Topics Provided Wound/Skin Impairment: Handouts: Caring for Your Ulcer, Other: Continue with home health visit. Methods: Demonstration, Explain/Verbal Responses: State content correctly Electronic Signature(s) Signed: 02/11/2020 2:52:33 PM By: Georges Mouse, Kenia Entered By: Georges Mouse, Minus Breeding on 02/11/2020 13:16:02 [name], [name] ([id number]) -------------------------------------------------------------------------------- Wound Assessment Details Patient Name: [name] Date of Service: 02/11/2020 12:30 PM Medical Record  Number: [id number] Patient Account Number: [id number] Date of Birth/Sex: [birth year] (86  y.o. F) Treating RN: Dolan Amen Primary Care Yolando Gillum: Denman George Other Clinician: Referring Isidor Bromell: Denman George Treating Arieon Scalzo/Extender: Skipper Cliche in Treatment: 17 Wound Status Wound Number: 3 Primary Etiology: Arterial Insufficiency Ulcer Wound Location: Right Metatarsal head fifth Wound Status: Open Wounding Event: Gradually Appeared Comorbid History: Arrhythmia, Osteoarthritis Date Acquired: 04/09/2019 Weeks Of Treatment: 17 Clustered Wound: No Photos Wound Measurements Length: (cm) 1.1 Width: (cm) 0.9 Depth: (cm) 0.3 Area: (cm) 0.778 Volume: (cm) 0.233 % Reduction in Area: 31.2% % Reduction in Volume: -106.2% Epithelialization: None Tunneling: No Undermining: No Wound Description Classification: Full Thickness Without Exposed Support Structu Wound Margin: Distinct, outline attached Exudate Amount: Medium Exudate Type: Serosanguineous Exudate Color: red, brown res Foul Odor After Cleansing: No Slough/Fibrino Yes Wound Bed Granulation Amount: Small (1-33%) Exposed Structure Granulation Quality: Red Fascia Exposed: No Necrotic Amount: Large (67-100%) Fat Layer (Subcutaneous Tissue) Exposed: Yes Necrotic Quality: Adherent Slough Tendon Exposed: No Muscle Exposed: No Joint Exposed: No Bone Exposed: No Treatment Notes Wound #3 (Right Metatarsal head fifth) Notes scell, bordered foam dressing Electronic Signature(s) Signed: 02/11/2020 2:52:33 PM By: Anise Salvo, Joline Salt ([id number]) Entered By: Georges Mouse, Minus Breeding on 02/11/2020 12:59:22 [name] ([id number]) -------------------------------------------------------------------------------- Wound Assessment Details Patient Name: [name]. Date of Service: 02/11/2020 12:30 PM Medical Record Number: [id number] Patient Account Number: [id number] Date of Birth/Sex: 1930-04-03 (86 y.o. F) Treating RN: Dolan Amen Primary Care Junelle Hashemi:  Denman George Other Clinician: Referring Dwon Sky: Denman George Treating Angi Goodell/Extender: Skipper Cliche in Treatment: 17 Wound Status Wound Number: 4 Primary Etiology: Trauma, Other Wound Location: Left, Posterior Upper Arm Wound Status: Healed - Epithelialized Wounding Event: Trauma Comorbid History: Arrhythmia, Osteoarthritis Date Acquired: 01/17/2020 Weeks Of Treatment: 2 Clustered Wound: No Photos Wound Measurements Length: (cm) 0 Width: (cm) 0 Depth: (cm) 0 Area: (cm) 0 Volume: (cm) 0 % Reduction in Area: 100% % Reduction in Volume: 100% Tunneling: No Undermining: No Wound Description Classification: Partial Thickness Wound Margin: Indistinct, nonvisible Exudate Amount: None Present Foul Odor After Cleansing: No Slough/Fibrino No Wound Bed Granulation Amount: None Present (0%) Exposed Structure Necrotic Amount: None Present (0%) Fascia Exposed: No Fat Layer (Subcutaneous Tissue) Exposed: No Tendon Exposed: No Muscle Exposed: No Joint Exposed: No Bone Exposed: No Electronic Signature(s) Signed: 02/11/2020 2:52:33 PM By: Georges Mouse, Minus Breeding Entered By: Georges Mouse, Minus Breeding on 02/11/2020 13:07:58 [name] ([id number]) -------------------------------------------------------------------------------- [place] Details Patient Name: [name]. Date of Service: 02/11/2020 12:30 PM Medical Record Number: [id number] Patient Account Number: [id number] Date of Birth/Sex: [birth year] (87 y.o. F) Treating RN: Dolan Amen Primary Care Kita Neace: Denman George Other Clinician: Referring Dimitry Holsworth: Denman George Treating Mahmood Boehringer/Extender: Skipper Cliche in Treatment: 17 Vital Signs Time Taken: 12:42 Temperature (F): 97.8 Height (in): 62 Pulse (bpm): 62 Weight (lbs): 140 Respiratory Rate (breaths/min): 18 Body Mass Index (BMI): 25.6 Blood Pressure (mmHg): 173/72 Reference Range: 80 - 120 mg / dl Electronic  Signature(s) Signed: 02/11/2020 2:52:33 PM By: Georges Mouse, Minus Breeding Entered By: Georges Mouse, Minus Breeding on 02/11/2020 12:45:17

## 2020-02-15 ENCOUNTER — Telehealth: Payer: Self-pay | Admitting: Cardiovascular Disease

## 2020-02-15 DIAGNOSIS — E7849 Other hyperlipidemia: Secondary | ICD-10-CM | POA: Diagnosis not present

## 2020-02-15 DIAGNOSIS — E538 Deficiency of other specified B group vitamins: Secondary | ICD-10-CM | POA: Diagnosis not present

## 2020-02-15 DIAGNOSIS — L97513 Non-pressure chronic ulcer of other part of right foot with necrosis of muscle: Secondary | ICD-10-CM | POA: Diagnosis not present

## 2020-02-15 DIAGNOSIS — I130 Hypertensive heart and chronic kidney disease with heart failure and stage 1 through stage 4 chronic kidney disease, or unspecified chronic kidney disease: Secondary | ICD-10-CM | POA: Diagnosis not present

## 2020-02-15 DIAGNOSIS — Z7982 Long term (current) use of aspirin: Secondary | ICD-10-CM | POA: Diagnosis not present

## 2020-02-15 DIAGNOSIS — I872 Venous insufficiency (chronic) (peripheral): Secondary | ICD-10-CM | POA: Diagnosis not present

## 2020-02-15 DIAGNOSIS — G2581 Restless legs syndrome: Secondary | ICD-10-CM | POA: Diagnosis not present

## 2020-02-15 DIAGNOSIS — Z7952 Long term (current) use of systemic steroids: Secondary | ICD-10-CM | POA: Diagnosis not present

## 2020-02-15 DIAGNOSIS — I2510 Atherosclerotic heart disease of native coronary artery without angina pectoris: Secondary | ICD-10-CM | POA: Diagnosis not present

## 2020-02-15 DIAGNOSIS — Z9181 History of falling: Secondary | ICD-10-CM | POA: Diagnosis not present

## 2020-02-15 DIAGNOSIS — G9009 Other idiopathic peripheral autonomic neuropathy: Secondary | ICD-10-CM | POA: Diagnosis not present

## 2020-02-15 DIAGNOSIS — M5136 Other intervertebral disc degeneration, lumbar region: Secondary | ICD-10-CM | POA: Diagnosis not present

## 2020-02-15 DIAGNOSIS — E559 Vitamin D deficiency, unspecified: Secondary | ICD-10-CM | POA: Diagnosis not present

## 2020-02-15 DIAGNOSIS — I4891 Unspecified atrial fibrillation: Secondary | ICD-10-CM | POA: Diagnosis not present

## 2020-02-15 DIAGNOSIS — S62102D Fracture of unspecified carpal bone, left wrist, subsequent encounter for fracture with routine healing: Secondary | ICD-10-CM | POA: Diagnosis not present

## 2020-02-15 DIAGNOSIS — N1831 Chronic kidney disease, stage 3a: Secondary | ICD-10-CM | POA: Diagnosis not present

## 2020-02-15 DIAGNOSIS — M1990 Unspecified osteoarthritis, unspecified site: Secondary | ICD-10-CM | POA: Diagnosis not present

## 2020-02-15 DIAGNOSIS — I10 Essential (primary) hypertension: Secondary | ICD-10-CM | POA: Diagnosis not present

## 2020-02-15 DIAGNOSIS — I739 Peripheral vascular disease, unspecified: Secondary | ICD-10-CM | POA: Diagnosis not present

## 2020-02-15 DIAGNOSIS — I5032 Chronic diastolic (congestive) heart failure: Secondary | ICD-10-CM | POA: Diagnosis not present

## 2020-02-15 DIAGNOSIS — M810 Age-related osteoporosis without current pathological fracture: Secondary | ICD-10-CM | POA: Diagnosis not present

## 2020-02-15 DIAGNOSIS — I272 Pulmonary hypertension, unspecified: Secondary | ICD-10-CM | POA: Diagnosis not present

## 2020-02-15 DIAGNOSIS — K219 Gastro-esophageal reflux disease without esophagitis: Secondary | ICD-10-CM | POA: Diagnosis not present

## 2020-02-15 DIAGNOSIS — Z7901 Long term (current) use of anticoagulants: Secondary | ICD-10-CM | POA: Diagnosis not present

## 2020-02-15 NOTE — Telephone Encounter (Signed)
Patients daughter called back and was advised about the samples. Daughter is appreciative on the samples for one week

## 2020-02-15 NOTE — Telephone Encounter (Signed)
Patient calling the office for samples of medication:   1.  What medication and dosage are you requesting samples for? Eliquis 2.5 MG 1 tablet 2 times daily   2.  Are you currently out of this medication? Will be out completely on Nov 20th - would like to know if she can get samples for the rest of the year until her insurance starts again in Jan, she will be in the donut hole.  If unable to do rest of the year, she will take what she can.  If needed please call daughter [name] to discuss.

## 2020-02-15 NOTE — Telephone Encounter (Signed)
Samples of Eliquis are limited and we will not be able to provide samples through the end of the year. Ok to provide the patient with a couple of weeks worth if available.

## 2020-02-15 NOTE — Telephone Encounter (Signed)
Please advise if you would like for me to provide samples for the rest of the year for patient.

## 2020-02-15 NOTE — Telephone Encounter (Signed)
Called patient.  No answer. LMOV.  We can not supply samples until the rest of the year.  We have one box (7 days) that I will place up front for her.  Drug name: Eliquis     Strength: 2.5MG         Qty: 1 box LOT: [id number]  Exp.Date:10/22  [name] 12:15 PM 02/15/2020

## 2020-02-17 DIAGNOSIS — I739 Peripheral vascular disease, unspecified: Secondary | ICD-10-CM | POA: Diagnosis not present

## 2020-02-17 DIAGNOSIS — I272 Pulmonary hypertension, unspecified: Secondary | ICD-10-CM | POA: Diagnosis not present

## 2020-02-17 DIAGNOSIS — I2510 Atherosclerotic heart disease of native coronary artery without angina pectoris: Secondary | ICD-10-CM | POA: Diagnosis not present

## 2020-02-17 DIAGNOSIS — M1990 Unspecified osteoarthritis, unspecified site: Secondary | ICD-10-CM | POA: Diagnosis not present

## 2020-02-17 DIAGNOSIS — N1831 Chronic kidney disease, stage 3a: Secondary | ICD-10-CM | POA: Diagnosis not present

## 2020-02-17 DIAGNOSIS — G9009 Other idiopathic peripheral autonomic neuropathy: Secondary | ICD-10-CM | POA: Diagnosis not present

## 2020-02-17 DIAGNOSIS — K219 Gastro-esophageal reflux disease without esophagitis: Secondary | ICD-10-CM | POA: Diagnosis not present

## 2020-02-17 DIAGNOSIS — G2581 Restless legs syndrome: Secondary | ICD-10-CM | POA: Diagnosis not present

## 2020-02-17 DIAGNOSIS — E559 Vitamin D deficiency, unspecified: Secondary | ICD-10-CM | POA: Diagnosis not present

## 2020-02-17 DIAGNOSIS — Z9181 History of falling: Secondary | ICD-10-CM | POA: Diagnosis not present

## 2020-02-17 DIAGNOSIS — I130 Hypertensive heart and chronic kidney disease with heart failure and stage 1 through stage 4 chronic kidney disease, or unspecified chronic kidney disease: Secondary | ICD-10-CM | POA: Diagnosis not present

## 2020-02-17 DIAGNOSIS — I4891 Unspecified atrial fibrillation: Secondary | ICD-10-CM | POA: Diagnosis not present

## 2020-02-17 DIAGNOSIS — S62102D Fracture of unspecified carpal bone, left wrist, subsequent encounter for fracture with routine healing: Secondary | ICD-10-CM | POA: Diagnosis not present

## 2020-02-17 DIAGNOSIS — Z7952 Long term (current) use of systemic steroids: Secondary | ICD-10-CM | POA: Diagnosis not present

## 2020-02-17 DIAGNOSIS — E7849 Other hyperlipidemia: Secondary | ICD-10-CM | POA: Diagnosis not present

## 2020-02-17 DIAGNOSIS — L97513 Non-pressure chronic ulcer of other part of right foot with necrosis of muscle: Secondary | ICD-10-CM | POA: Diagnosis not present

## 2020-02-17 DIAGNOSIS — I872 Venous insufficiency (chronic) (peripheral): Secondary | ICD-10-CM | POA: Diagnosis not present

## 2020-02-17 DIAGNOSIS — Z7901 Long term (current) use of anticoagulants: Secondary | ICD-10-CM | POA: Diagnosis not present

## 2020-02-17 DIAGNOSIS — I5032 Chronic diastolic (congestive) heart failure: Secondary | ICD-10-CM | POA: Diagnosis not present

## 2020-02-17 DIAGNOSIS — E538 Deficiency of other specified B group vitamins: Secondary | ICD-10-CM | POA: Diagnosis not present

## 2020-02-17 DIAGNOSIS — M810 Age-related osteoporosis without current pathological fracture: Secondary | ICD-10-CM | POA: Diagnosis not present

## 2020-02-17 DIAGNOSIS — I10 Essential (primary) hypertension: Secondary | ICD-10-CM | POA: Diagnosis not present

## 2020-02-17 DIAGNOSIS — Z7982 Long term (current) use of aspirin: Secondary | ICD-10-CM | POA: Diagnosis not present

## 2020-02-17 DIAGNOSIS — M5136 Other intervertebral disc degeneration, lumbar region: Secondary | ICD-10-CM | POA: Diagnosis not present

## 2020-02-18 DIAGNOSIS — G9009 Other idiopathic peripheral autonomic neuropathy: Secondary | ICD-10-CM | POA: Diagnosis not present

## 2020-02-18 DIAGNOSIS — I739 Peripheral vascular disease, unspecified: Secondary | ICD-10-CM | POA: Diagnosis not present

## 2020-02-18 DIAGNOSIS — I872 Venous insufficiency (chronic) (peripheral): Secondary | ICD-10-CM | POA: Diagnosis not present

## 2020-02-18 DIAGNOSIS — L97513 Non-pressure chronic ulcer of other part of right foot with necrosis of muscle: Secondary | ICD-10-CM | POA: Diagnosis not present

## 2020-02-18 DIAGNOSIS — K219 Gastro-esophageal reflux disease without esophagitis: Secondary | ICD-10-CM | POA: Diagnosis not present

## 2020-02-18 DIAGNOSIS — E538 Deficiency of other specified B group vitamins: Secondary | ICD-10-CM | POA: Diagnosis not present

## 2020-02-18 DIAGNOSIS — M810 Age-related osteoporosis without current pathological fracture: Secondary | ICD-10-CM | POA: Diagnosis not present

## 2020-02-18 DIAGNOSIS — Z7982 Long term (current) use of aspirin: Secondary | ICD-10-CM | POA: Diagnosis not present

## 2020-02-18 DIAGNOSIS — Z9181 History of falling: Secondary | ICD-10-CM | POA: Diagnosis not present

## 2020-02-18 DIAGNOSIS — I5032 Chronic diastolic (congestive) heart failure: Secondary | ICD-10-CM | POA: Diagnosis not present

## 2020-02-18 DIAGNOSIS — Z7901 Long term (current) use of anticoagulants: Secondary | ICD-10-CM | POA: Diagnosis not present

## 2020-02-18 DIAGNOSIS — I10 Essential (primary) hypertension: Secondary | ICD-10-CM | POA: Diagnosis not present

## 2020-02-18 DIAGNOSIS — M1990 Unspecified osteoarthritis, unspecified site: Secondary | ICD-10-CM | POA: Diagnosis not present

## 2020-02-18 DIAGNOSIS — I2510 Atherosclerotic heart disease of native coronary artery without angina pectoris: Secondary | ICD-10-CM | POA: Diagnosis not present

## 2020-02-18 DIAGNOSIS — G2581 Restless legs syndrome: Secondary | ICD-10-CM | POA: Diagnosis not present

## 2020-02-18 DIAGNOSIS — Z7952 Long term (current) use of systemic steroids: Secondary | ICD-10-CM | POA: Diagnosis not present

## 2020-02-18 DIAGNOSIS — N1831 Chronic kidney disease, stage 3a: Secondary | ICD-10-CM | POA: Diagnosis not present

## 2020-02-18 DIAGNOSIS — I272 Pulmonary hypertension, unspecified: Secondary | ICD-10-CM | POA: Diagnosis not present

## 2020-02-18 DIAGNOSIS — I130 Hypertensive heart and chronic kidney disease with heart failure and stage 1 through stage 4 chronic kidney disease, or unspecified chronic kidney disease: Secondary | ICD-10-CM | POA: Diagnosis not present

## 2020-02-18 DIAGNOSIS — E559 Vitamin D deficiency, unspecified: Secondary | ICD-10-CM | POA: Diagnosis not present

## 2020-02-18 DIAGNOSIS — M5136 Other intervertebral disc degeneration, lumbar region: Secondary | ICD-10-CM | POA: Diagnosis not present

## 2020-02-18 DIAGNOSIS — E7849 Other hyperlipidemia: Secondary | ICD-10-CM | POA: Diagnosis not present

## 2020-02-18 DIAGNOSIS — S62102D Fracture of unspecified carpal bone, left wrist, subsequent encounter for fracture with routine healing: Secondary | ICD-10-CM | POA: Diagnosis not present

## 2020-02-18 DIAGNOSIS — I4891 Unspecified atrial fibrillation: Secondary | ICD-10-CM | POA: Diagnosis not present

## 2020-02-21 ENCOUNTER — Other Ambulatory Visit: Payer: Self-pay | Admitting: Family Medicine

## 2020-02-21 ENCOUNTER — Other Ambulatory Visit: Payer: Self-pay

## 2020-02-21 ENCOUNTER — Encounter: Payer: Self-pay | Admitting: Family Medicine

## 2020-02-21 ENCOUNTER — Ambulatory Visit (INDEPENDENT_AMBULATORY_CARE_PROVIDER_SITE_OTHER): Payer: Medicare Other | Admitting: Family Medicine

## 2020-02-21 VITALS — BP 152/74 | HR 62 | Temp 98.0°F | Ht 62.0 in | Wt 135.1 lb

## 2020-02-21 DIAGNOSIS — I10 Essential (primary) hypertension: Secondary | ICD-10-CM | POA: Diagnosis not present

## 2020-02-21 DIAGNOSIS — M79604 Pain in right leg: Secondary | ICD-10-CM | POA: Diagnosis not present

## 2020-02-21 DIAGNOSIS — E538 Deficiency of other specified B group vitamins: Secondary | ICD-10-CM

## 2020-02-21 DIAGNOSIS — N1831 Chronic kidney disease, stage 3a: Secondary | ICD-10-CM

## 2020-02-21 DIAGNOSIS — M79605 Pain in left leg: Secondary | ICD-10-CM

## 2020-02-21 DIAGNOSIS — G8929 Other chronic pain: Secondary | ICD-10-CM

## 2020-02-21 DIAGNOSIS — R2681 Unsteadiness on feet: Secondary | ICD-10-CM

## 2020-02-21 DIAGNOSIS — R5383 Other fatigue: Secondary | ICD-10-CM | POA: Diagnosis not present

## 2020-02-21 DIAGNOSIS — G6289 Other specified polyneuropathies: Secondary | ICD-10-CM

## 2020-02-21 LAB — RENAL FUNCTION PANEL
Albumin: 3.9 g/dL (ref 3.5–5.2)
BUN: 11 mg/dL (ref 6–23)
CO2: 30 mEq/L (ref 19–32)
Calcium: 8.8 mg/dL (ref 8.4–10.5)
Chloride: 105 mEq/L (ref 96–112)
Creatinine, Ser: 0.85 mg/dL (ref 0.40–1.20)
GFR: 61.04 mL/min (ref >60.00)
Glucose, Bld: 91 mg/dL (ref 70–99)
Phosphorus: 3.7 mg/dL (ref 2.3–4.6)
Potassium: 3.6 mEq/L (ref 3.5–5.1)
Sodium: 142 mEq/L (ref 135–145)

## 2020-02-21 LAB — TSH: TSH: 3.62 u[IU]/mL (ref 0.35–4.50)

## 2020-02-21 MED ORDER — CYANOCOBALAMIN 1000 MCG/ML IJ SOLN
1000.0000 ug | Freq: Once | INTRAMUSCULAR | Status: AC
  Administered 2020-02-21: 1000 ug via INTRAMUSCULAR

## 2020-02-21 MED ORDER — LISINOPRIL 10 MG PO TABS
10.0000 mg | ORAL_TABLET | Freq: Every day | ORAL | 6 refills | Status: DC
Start: 2020-02-21 — End: 2020-10-14

## 2020-02-21 NOTE — Assessment & Plan Note (Signed)
Worsening recently - she did not receive B12 shots the last 2 months. Will restart and monitor effect on fatigue.

## 2020-02-21 NOTE — Assessment & Plan Note (Addendum)
H/o falls at home, not recently however. Will refer back to [facility] PT - currently has [facility] at home.

## 2020-02-21 NOTE — Progress Notes (Signed)
Patient ID: [name], female    DOB: 11/30/1929, 86 y.o.   MRN: [id number]  This visit was conducted in person.  BP (!) 152/74 (BP Location: Left Arm, Patient Position: Sitting, Cuff Size: Normal)   Pulse 62   Temp 98 F (36.7 C) (Temporal)   Ht 5\' 2"  (1.575 m)   Wt 135 lb 1 oz (61.3 kg)   SpO2 96%   BMI 24.70 kg/m   BP Readings from Last 3 Encounters:  02/21/20 (!) 152/74  02/01/20 (!) 150/74  01/19/20 140/90  Elevated on repeat testing  CC: 4 wk f/u visit  Subjective:   HPI: [name] is a 86 y.o. female presenting on 02/21/2020 for Follow-up (Pt provided log of recent BP readings.  Here for 4 wk f/u.  Pt accompanied by daughter, [name]- temp 97.9.)   Staying fatigued, low energy, feels legs getting weaker despite walking with rollator. Asks about home therapy. Last [facility] PT/OT 11/2019. [place] and broke her wrist so therapy sessions were cut short. Limits trips out of the house - mainly to doctor offices. Continues receiving [facility] SN (wound care) and nurse aide (baths).   Missed b12 shots last month and this month - will update today. Encouraged completing full 6 months of B12 replacement (has received intermittently on and off).   HTN - Compliant with current antihypertensive regimen of metoprolol 25mg  1/2 tab bid, 2 wks ago we added lisinopril 5mg  daily. Does check blood pressures at home and brings log: markedly elevated up to 150-170/60-70s, HR 50-60s. No low blood pressure readings or symptoms of dizziness/syncope.      Relevant past medical, surgical, family and social history reviewed and updated as indicated. Interim medical history since our last visit reviewed. Allergies and medications reviewed and updated. Outpatient Medications Prior to Visit  Medication Sig Dispense Refill  . acetaminophen (TYLENOL) 500 MG tablet Take 1,000 mg every 8 (eight) hours as needed by mouth for moderate pain.     Marland Kitchen acyclovir (ZOVIRAX) 400 MG tablet Take 400 mg 2 (two)  times daily by mouth.     Marland Kitchen amiodarone (PACERONE) 100 MG tablet Take 1 tablet (100 mg total) by mouth daily.    Marland Kitchen aspirin EC 81 MG tablet Take 1 tablet (81 mg total) by mouth daily. 150 tablet 2  . atorvastatin (LIPITOR) 20 MG tablet TAKE 1 TABLET BY MOUTH ONCE DAILY (Patient taking differently: Take 20 mg by mouth daily. ) 90 tablet 1  . DULoxetine (CYMBALTA) 60 MG capsule TAKE 1 CAPSULE BY MOUTH ONCE DAILY (Patient taking differently: Take 60 mg by mouth daily. ) 90 capsule 1  . ELIQUIS 2.5 MG TABS tablet TAKE 1 TABLET BY MOUTH TWICE DAILY (Patient taking differently: Take 2.5 mg by mouth 2 (two) times daily. ) 180 tablet 1  . ergocalciferol (VITAMIN D2) 1.25 MG (50000 UT) capsule Take 1 capsule (50,000 Units total) by mouth once a week. (Patient taking differently: Take 50,000 Units by mouth once a week. Saturday) 12 capsule 3  . gabapentin (NEURONTIN) 300 MG capsule Take 1 capsule (300 mg total) by mouth at bedtime.    . memantine (NAMENDA) 5 MG tablet Take 1 tablet (5 mg total) by mouth 2 (two) times daily. 60 tablet 1  . metoprolol tartrate (LOPRESSOR) 25 MG tablet TAKE 1/2 TABLET BY MOUTH TWICE DAILY 90 tablet 3  . pantoprazole (PROTONIX) 40 MG tablet TAKE 1 TABLET BY MOUTH EVERY DAY (Patient taking differently: Take 40 mg by  mouth daily. ) 90 tablet 1  . polyethylene glycol (MIRALAX / GLYCOLAX) 17 g packet Take 17 g by mouth daily as needed for mild constipation. 14 each 0  . prednisoLONE acetate (PRED FORTE) 1 % ophthalmic suspension Place 1 drop into the left eye daily.     Marland Kitchen rOPINIRole (REQUIP) 1 MG tablet Take 2.5 tablets (2.5 mg total) by mouth at bedtime.    . traMADol (ULTRAM) 50 MG tablet Take 50 mg by mouth daily. Takes 2 tablets daily  0  . collagenase (SANTYL) ointment Apply 1 application topically daily. 15 g 0  . lisinopril (ZESTRIL) 5 MG tablet Take 1 tablet (5 mg total) by mouth daily. 30 tablet 6  . sulfamethoxazole-trimethoprim (BACTRIM DS) 800-160 MG tablet Take 1 tablet by  mouth 2 (two) times daily.     No facility-administered medications prior to visit.     Per HPI unless specifically indicated in ROS section below Review of Systems Objective:  BP (!) 152/74 (BP Location: Left Arm, Patient Position: Sitting, Cuff Size: Normal)   Pulse 62   Temp 98 F (36.7 C) (Temporal)   Ht 5\' 2"  (1.575 m)   Wt 135 lb 1 oz (61.3 kg)   SpO2 96%   BMI 24.70 kg/m   Wt Readings from Last 3 Encounters:  02/21/20 135 lb 1 oz (61.3 kg)  02/01/20 134 lb 8 oz (61 kg)  01/19/20 133 lb 6 oz (60.5 kg)      Physical Exam Vitals and nursing note reviewed.  Constitutional:      Appearance: Normal appearance. She is not ill-appearing.  Cardiovascular:     Rate and Rhythm: Normal rate and regular rhythm.     Pulses: Normal pulses.     Heart sounds: Murmur (3/6 systolic USB) heard.   Pulmonary:     Effort: Pulmonary effort is normal. No respiratory distress.     Breath sounds: Normal breath sounds. No wheezing, rhonchi or rales.  Musculoskeletal:     Right lower leg: No edema.     Left lower leg: No edema.  Skin:    General: Skin is warm and dry.     Findings: No rash.  Neurological:     Mental Status: She is alert.  Psychiatric:        Mood and Affect: Mood normal.        Behavior: Behavior normal.       Results for orders placed or performed in visit on 12/27/19  Renal function panel  Result Value Ref Range   Sodium 139 135 - 145 mEq/L   Potassium 4.0 3.5 - 5.1 mEq/L   Chloride 101 96 - 112 mEq/L   CO2 28 19 - 32 mEq/L   Albumin 3.9 3.5 - 5.2 g/dL   BUN 15 6 - 23 mg/dL   Creatinine, Ser 1.07 0.40 - 1.20 mg/dL   Glucose, Bld 89 70 - 99 mg/dL   Phosphorus 3.6 2.3 - 4.6 mg/dL   GFR 48.33 (L) >60.00 mL/min   Calcium 9.2 8.4 - 10.5 mg/dL   Lab Results  Component Value Date   VITAMINB12 277 11/16/2019    Assessment & Plan:  This visit occurred during the SARS-CoV-2 public health emergency.  Safety protocols were in place, including screening questions  prior to the visit, additional usage of staff PPE, and extensive cleaning of exam room while observing appropriate contact time as indicated for disinfecting solutions.   Problem List Items Addressed This Visit    Vitamin B12  deficiency    B12 shot today.  Advised return monthly for nurse visit if OV not scheduled.       Peripheral neuropathy   Relevant Orders   Ambulatory referral to [place] unsteadiness    H/o falls at home, not recently however. Will refer back to [facility] PT - [facility].       Relevant Orders   Ambulatory referral to Home Health   Fatigue - Primary    Worsening recently - she did not receive B12 shots the last 2 months. Will restart and monitor effect on fatigue.       Essential hypertension    Chronic, remaining uncontrolled at home and in office today.  Beta blocker titration limited by bradycardia. Will increase lisinopril to 10mg  daily Check BMP after recently starting ACEI (started 02/07/2020).       Relevant Medications   lisinopril (ZESTRIL) 10 MG tablet   CKD (chronic kidney disease) stage 3, GFR 30-59 ml/min (HCC)    Update Cr after starting ACEI.       Relevant Orders   Renal function panel   TSH   Chronic leg pain   Relevant Orders   Ambulatory referral to [place] ordered this encounter  Medications  . lisinopril (ZESTRIL) 10 MG tablet    Sig: Take 1 tablet (10 mg total) by mouth daily.    Dispense:  30 tablet    Refill:  6  . cyanocobalamin ((VITAMIN B-12)) injection 1,000 mcg   Orders Placed This Encounter  Procedures  . Renal function panel  . TSH  . Ambulatory referral to Home Health    Referral Priority:   Routine    Referral Type:   Home Health Care    Referral Reason:   Specialty Services Required    Requested Specialty:   [place]    Number of Visits Requested:   1    Patient Instructions  We will refer you to [facility] home health physical therapy.  Labs  today  Increase lisinopril to 10mg  daily - double up on what you have at home, new dose will be at pharmacy.  Good to see you today! Return as needed   Restart b12 shots monthly for 6 months then reassess fatigue.  Return in 1 month for follow up visit.    Follow up plan: Return in about 4 weeks (around 03/20/2020) for follow up visit.  Ria Bush, MD

## 2020-02-21 NOTE — Assessment & Plan Note (Addendum)
Chronic, remaining uncontrolled at home and in office today.  Beta blocker titration limited by bradycardia. Will increase lisinopril to 10mg  daily Check BMP after recently starting ACEI (started 02/07/2020).

## 2020-02-21 NOTE — Telephone Encounter (Signed)
Gabapentin Last filled:  10/13/19, #270 Last OV:  02/21/20, 4 wk f/u Next OV:  03/22/20, 4 wk f/u

## 2020-02-21 NOTE — Assessment & Plan Note (Addendum)
B12 shot today.  Advised return monthly for nurse visit if OV not scheduled.

## 2020-02-21 NOTE — Assessment & Plan Note (Signed)
Update Cr after starting ACEI.

## 2020-02-21 NOTE — Patient Instructions (Addendum)
We will refer you to [facility] home health physical therapy.  Labs today  Increase lisinopril to 10mg  daily - double up on what you have at home, new dose will be at pharmacy.  Good to see you today! Return as needed   Restart b12 shots monthly for 6 months then reassess fatigue.  Return in 1 month for follow up visit.

## 2020-02-22 DIAGNOSIS — E538 Deficiency of other specified B group vitamins: Secondary | ICD-10-CM | POA: Diagnosis not present

## 2020-02-22 DIAGNOSIS — I739 Peripheral vascular disease, unspecified: Secondary | ICD-10-CM | POA: Diagnosis not present

## 2020-02-22 DIAGNOSIS — I130 Hypertensive heart and chronic kidney disease with heart failure and stage 1 through stage 4 chronic kidney disease, or unspecified chronic kidney disease: Secondary | ICD-10-CM | POA: Diagnosis not present

## 2020-02-22 DIAGNOSIS — S62102D Fracture of unspecified carpal bone, left wrist, subsequent encounter for fracture with routine healing: Secondary | ICD-10-CM | POA: Diagnosis not present

## 2020-02-22 DIAGNOSIS — E559 Vitamin D deficiency, unspecified: Secondary | ICD-10-CM | POA: Diagnosis not present

## 2020-02-22 DIAGNOSIS — E7849 Other hyperlipidemia: Secondary | ICD-10-CM | POA: Diagnosis not present

## 2020-02-22 DIAGNOSIS — K219 Gastro-esophageal reflux disease without esophagitis: Secondary | ICD-10-CM | POA: Diagnosis not present

## 2020-02-22 DIAGNOSIS — Z7901 Long term (current) use of anticoagulants: Secondary | ICD-10-CM | POA: Diagnosis not present

## 2020-02-22 DIAGNOSIS — M810 Age-related osteoporosis without current pathological fracture: Secondary | ICD-10-CM | POA: Diagnosis not present

## 2020-02-22 DIAGNOSIS — G9009 Other idiopathic peripheral autonomic neuropathy: Secondary | ICD-10-CM | POA: Diagnosis not present

## 2020-02-22 DIAGNOSIS — I272 Pulmonary hypertension, unspecified: Secondary | ICD-10-CM | POA: Diagnosis not present

## 2020-02-22 DIAGNOSIS — G2581 Restless legs syndrome: Secondary | ICD-10-CM | POA: Diagnosis not present

## 2020-02-22 DIAGNOSIS — Z7982 Long term (current) use of aspirin: Secondary | ICD-10-CM | POA: Diagnosis not present

## 2020-02-22 DIAGNOSIS — Z9181 History of falling: Secondary | ICD-10-CM | POA: Diagnosis not present

## 2020-02-22 DIAGNOSIS — Z7952 Long term (current) use of systemic steroids: Secondary | ICD-10-CM | POA: Diagnosis not present

## 2020-02-22 DIAGNOSIS — M1990 Unspecified osteoarthritis, unspecified site: Secondary | ICD-10-CM | POA: Diagnosis not present

## 2020-02-22 DIAGNOSIS — N1831 Chronic kidney disease, stage 3a: Secondary | ICD-10-CM | POA: Diagnosis not present

## 2020-02-22 DIAGNOSIS — I2510 Atherosclerotic heart disease of native coronary artery without angina pectoris: Secondary | ICD-10-CM | POA: Diagnosis not present

## 2020-02-22 DIAGNOSIS — M5136 Other intervertebral disc degeneration, lumbar region: Secondary | ICD-10-CM | POA: Diagnosis not present

## 2020-02-22 DIAGNOSIS — I10 Essential (primary) hypertension: Secondary | ICD-10-CM | POA: Diagnosis not present

## 2020-02-22 DIAGNOSIS — I4891 Unspecified atrial fibrillation: Secondary | ICD-10-CM | POA: Diagnosis not present

## 2020-02-22 DIAGNOSIS — I5032 Chronic diastolic (congestive) heart failure: Secondary | ICD-10-CM | POA: Diagnosis not present

## 2020-02-22 DIAGNOSIS — I872 Venous insufficiency (chronic) (peripheral): Secondary | ICD-10-CM | POA: Diagnosis not present

## 2020-02-22 DIAGNOSIS — L97513 Non-pressure chronic ulcer of other part of right foot with necrosis of muscle: Secondary | ICD-10-CM | POA: Diagnosis not present

## 2020-02-23 ENCOUNTER — Other Ambulatory Visit: Payer: Self-pay

## 2020-02-23 ENCOUNTER — Other Ambulatory Visit: Payer: Medicare Other | Admitting: Adult Health Nurse Practitioner

## 2020-02-23 DIAGNOSIS — I5032 Chronic diastolic (congestive) heart failure: Secondary | ICD-10-CM

## 2020-02-23 DIAGNOSIS — F0390 Unspecified dementia without behavioral disturbance: Secondary | ICD-10-CM

## 2020-02-23 DIAGNOSIS — Z515 Encounter for palliative care: Secondary | ICD-10-CM

## 2020-02-23 NOTE — Progress Notes (Signed)
[facility] Consult Note Telephone: [phone]  Fax: [phone]  PATIENT NAME: [name] DOB: 07-10-1929 MRN: [id number]  PRIMARY CARE PROVIDER:   Ria Bush, MD  REFERRING PROVIDER:  Ria Bush, MD [address] [place],  [place] [address]  RESPONSIBLE PARTY:  [name], daughter [phone]    RECOMMENDATIONS and PLAN:  1.  Advanced care planning.  Patient is DNR  2.  Functional status.  Patient is able to get up and walk with walker. She is able to get in and out of bed independently.  She does require assistance with showering but can dress on her own.  States that her appetite is good but she is not eating as much as she used to.  She supplements with boost.  Weight has been stable at 135 pounds.She is continent of bowel and bladder though sometimes has functional incontinence of bladder.  Patient now uses bedside commode at bedside during the night.  States that she sees in the future that she will need more assistance with getting out of bed in the morning.  Discussed hospital bed and patient does not want a hospital bed at this time.  Patient will be starting PT through home health for increased weakness.  Work with PT as ordered  3.  Elevated blood pressure.  Patient is being evaluated and followed by PCP for elevated blood pressures.  Today her blood pressure is 169/82.  She was started on lisinopril 5 mg and this was increased to 10 mg 2 days ago.  Have encouraged her to keep a log of her blood pressures and if it remains elevated in a week to contact her PCP.  Daughter states that her PCP is already instructed them to do this.  Continue to monitor blood pressure and make adjustments to BP meds as needed  4.  Support.  Patient realizes and voices today that she needs more help in the mornings because she feels more dizzy, off balance and drowsy when she first gets up.  States that this has been going  on for a long time.  D[facility] is working with [facility] to get aid in attendance services in the home.  Encouraged daughter to reach out to [employer] for resources for help in the home.  Daughter has list of agencies that she can contact if she needs more immediate help before other resources can be started.  Palliative will continue to monitor for symptom management/decline and make recommendations as needed.  Next appointment is in 6 weeks.  Daughter encouraged to call with any questions or concerns.   I spent 60 minutes providing this consultation,  from 2:00 to 3:00 including time spent with patient/family, chart review, provider coordination, documentation. More than 50% of the time in this consultation was spent coordinating communication.   HISTORY OF PRESENT ILLNESS:  [name] is a 86 y.o. year old female with multiple medical problems including CHF, HTN, PVD with chronic ulcer of right foot, peripheral neuropathy, GERD, HLD, A. fib, RLS, CAD, CKD D stage III. Palliative Care was asked to help address goals of care.  Patient complains of dizziness and being off balance more so in the mornings when she first gets up and through the rest of the day.  This has been her baseline for several months now.  Denies recent falls.  Patient recently diagnosed with mild dementia and started on Namenda.  Patient complains of shortness of breath with activity.  She does try to stay active but does take naps throughout the day.  She has been feeling weaker and is currently getting monthly vitamin B12 injections at PCP office.  Denies chest pain, cough, increased edema.  Patient's blood pressure has been elevated, see above.  CODE STATUS: DNR  PPS: 60% HOSPICE ELIGIBILITY/DIAGNOSIS: TBD  PHYSICAL EXAM:  BP 169/82 HR 55 General: NAD, frail appearing Extremities: no edema, no joint deformities Skin: no rashes exposed skin Neurological: Weakness but otherwise nonfocal; A&O x3; is having more  forgetfulness   PAST MEDICAL HISTORY:  Past Medical History:  Diagnosis Date  . Abdominal aortic atherosclerosis ([place]) 09/2015   by xray  . Anxiety   . BCC (basal cell carcinoma of skin) 05/2011   on nose  . Breast lesion    a. diagnosed with complex sclerosing lesion with calcifications of the right breast in 08/2016  . Chest pain    a. nuclear stress test 03/2012: normal; b. 09/2018 MV: EF >65%, no isch/scar.   . DDD (degenerative disc disease), lumbosacral 12/06/1992  . Gallstones 09/2015   incidentally by xray  . GERD (gastroesophageal reflux disease) 05/1999  . HERPES ZOSTER 04/13/2007   Qualifier: Diagnosis of  By: [name], [name]   . Hiatal hernia   . History of shingles    ophthalmic, takes acyclovir daily  . Hyperlipidemia 12/2003  . Hypertension 1990  . Mitral regurgitation    a. echo 03/2012: EF 60-65%, no RWMA, mild to mod AI/MR, mod TR, PASP 37 mmHg  . Osteoarthritis 08/21/1989  . Osteoporosis with fracture 11/1997   with compression fracture T12  . Persistent atrial fibrillation ([place]) 03/16/2012   a. CHADS2VASc => 5 (HTN, age x 2, vascular disease, sex category)-->Eliquis 5 BID;  b. Recurrent AF in 06/2016;  c. 01/2017 s/p DCCV;  d. 02/2017 recurrent AFib->Amio added->DCCV; e. 03/2017 recurrent AFib, s/p reload of amio and DCCV 04/02/2017  . POSTHERPETIC NEURALGIA 04/20/2007   Qualifier: Diagnosis of  By: [name], [name]   . Rheumatoid arthritis ([place])   . RLS (restless legs syndrome)     SOCIAL HX:  Social History   Tobacco Use  . Smoking status: Former Smoker    Packs/day: 0.25    Years: 1.00    Pack years: 0.25    Quit date: 04/08/1976    Years since quitting: 43.9  . Smokeless tobacco: Never Used  . Tobacco comment: pt smokes occasionally "socially"  Substance Use Topics  . Alcohol use: No    Alcohol/week: 1.0 standard drink    Types: 1 Glasses of wine per week    ALLERGIES:  Allergies  Allergen Reactions  . Penicillins Rash  and Other (See Comments)    Childhood Allergy Has patient had a PCN reaction causing immediate rash, facial/tongue/throat swelling, SOB or lightheadedness with hypotension: Yes Has patient had a PCN reaction causing severe rash involving mucus membranes or skin necrosis: Unknown Has patient had a PCN reaction that required hospitalization: No Has patient had a PCN reaction occurring within the last 10 years: No If all of the above answers are "NO", then may proceed with Cephalosporin use.      PERTINENT MEDICATIONS:  Outpatient Encounter Medications as of 02/23/2020  Medication Sig  . acetaminophen (TYLENOL) 500 MG tablet Take 1,000 mg every 8 (eight) hours as needed by mouth for moderate pain.   Marland Kitchen acyclovir (ZOVIRAX) 400 MG tablet Take 400 mg 2 (two) times daily by mouth.   Marland Kitchen amiodarone (PACERONE) 100  MG tablet Take 1 tablet (100 mg total) by mouth daily.  Marland Kitchen aspirin EC 81 MG tablet Take 1 tablet (81 mg total) by mouth daily.  Marland Kitchen atorvastatin (LIPITOR) 20 MG tablet TAKE 1 TABLET BY MOUTH ONCE DAILY (Patient taking differently: Take 20 mg by mouth daily. )  . DULoxetine (CYMBALTA) 60 MG capsule TAKE 1 CAPSULE BY MOUTH ONCE DAILY (Patient taking differently: Take 60 mg by mouth daily. )  . ELIQUIS 2.5 MG TABS tablet TAKE 1 TABLET BY MOUTH TWICE DAILY (Patient taking differently: Take 2.5 mg by mouth 2 (two) times daily. )  . ergocalciferol (VITAMIN D2) 1.25 MG (50000 UT) capsule Take 1 capsule (50,000 Units total) by mouth once a week. (Patient taking differently: Take 50,000 Units by mouth once a week. Saturday)  . gabapentin (NEURONTIN) 300 MG capsule TAKE 1 CAPSULE BY MOUTH 3 TIMES DAILY  . lisinopril (ZESTRIL) 10 MG tablet Take 1 tablet (10 mg total) by mouth daily.  . memantine (NAMENDA) 5 MG tablet Take 1 tablet (5 mg total) by mouth 2 (two) times daily.  . metoprolol tartrate (LOPRESSOR) 25 MG tablet TAKE 1/2 TABLET BY MOUTH TWICE DAILY  . pantoprazole (PROTONIX) 40 MG tablet TAKE 1  TABLET BY MOUTH EVERY DAY (Patient taking differently: Take 40 mg by mouth daily. )  . polyethylene glycol (MIRALAX / GLYCOLAX) 17 g packet Take 17 g by mouth daily as needed for mild constipation.  . prednisoLONE acetate (PRED FORTE) 1 % ophthalmic suspension Place 1 drop into the left eye daily.   Marland Kitchen rOPINIRole (REQUIP) 1 MG tablet Take 2.5 tablets (2.5 mg total) by mouth at bedtime.  . traMADol (ULTRAM) 50 MG tablet Take 50 mg by mouth daily. Takes 2 tablets daily   No facility-administered encounter medications on file as of 02/23/2020.     Nashla Althoff Jenetta Downer, NP

## 2020-02-24 ENCOUNTER — Ambulatory Visit: Payer: Medicare Other | Admitting: Physician Assistant

## 2020-02-25 ENCOUNTER — Telehealth: Payer: Self-pay

## 2020-02-25 DIAGNOSIS — N1831 Chronic kidney disease, stage 3a: Secondary | ICD-10-CM | POA: Diagnosis not present

## 2020-02-25 DIAGNOSIS — S62102D Fracture of unspecified carpal bone, left wrist, subsequent encounter for fracture with routine healing: Secondary | ICD-10-CM | POA: Diagnosis not present

## 2020-02-25 DIAGNOSIS — Z7901 Long term (current) use of anticoagulants: Secondary | ICD-10-CM | POA: Diagnosis not present

## 2020-02-25 DIAGNOSIS — M5136 Other intervertebral disc degeneration, lumbar region: Secondary | ICD-10-CM | POA: Diagnosis not present

## 2020-02-25 DIAGNOSIS — G9009 Other idiopathic peripheral autonomic neuropathy: Secondary | ICD-10-CM | POA: Diagnosis not present

## 2020-02-25 DIAGNOSIS — I739 Peripheral vascular disease, unspecified: Secondary | ICD-10-CM | POA: Diagnosis not present

## 2020-02-25 DIAGNOSIS — I872 Venous insufficiency (chronic) (peripheral): Secondary | ICD-10-CM | POA: Diagnosis not present

## 2020-02-25 DIAGNOSIS — G2581 Restless legs syndrome: Secondary | ICD-10-CM | POA: Diagnosis not present

## 2020-02-25 DIAGNOSIS — E538 Deficiency of other specified B group vitamins: Secondary | ICD-10-CM | POA: Diagnosis not present

## 2020-02-25 DIAGNOSIS — M810 Age-related osteoporosis without current pathological fracture: Secondary | ICD-10-CM | POA: Diagnosis not present

## 2020-02-25 DIAGNOSIS — E559 Vitamin D deficiency, unspecified: Secondary | ICD-10-CM | POA: Diagnosis not present

## 2020-02-25 DIAGNOSIS — I4891 Unspecified atrial fibrillation: Secondary | ICD-10-CM | POA: Diagnosis not present

## 2020-02-25 DIAGNOSIS — K219 Gastro-esophageal reflux disease without esophagitis: Secondary | ICD-10-CM | POA: Diagnosis not present

## 2020-02-25 DIAGNOSIS — I5032 Chronic diastolic (congestive) heart failure: Secondary | ICD-10-CM | POA: Diagnosis not present

## 2020-02-25 DIAGNOSIS — Z9181 History of falling: Secondary | ICD-10-CM | POA: Diagnosis not present

## 2020-02-25 DIAGNOSIS — E7849 Other hyperlipidemia: Secondary | ICD-10-CM | POA: Diagnosis not present

## 2020-02-25 DIAGNOSIS — Z7982 Long term (current) use of aspirin: Secondary | ICD-10-CM | POA: Diagnosis not present

## 2020-02-25 DIAGNOSIS — I2510 Atherosclerotic heart disease of native coronary artery without angina pectoris: Secondary | ICD-10-CM | POA: Diagnosis not present

## 2020-02-25 DIAGNOSIS — I130 Hypertensive heart and chronic kidney disease with heart failure and stage 1 through stage 4 chronic kidney disease, or unspecified chronic kidney disease: Secondary | ICD-10-CM | POA: Diagnosis not present

## 2020-02-25 DIAGNOSIS — Z7952 Long term (current) use of systemic steroids: Secondary | ICD-10-CM | POA: Diagnosis not present

## 2020-02-25 DIAGNOSIS — L97513 Non-pressure chronic ulcer of other part of right foot with necrosis of muscle: Secondary | ICD-10-CM | POA: Diagnosis not present

## 2020-02-25 DIAGNOSIS — M1990 Unspecified osteoarthritis, unspecified site: Secondary | ICD-10-CM | POA: Diagnosis not present

## 2020-02-25 DIAGNOSIS — I272 Pulmonary hypertension, unspecified: Secondary | ICD-10-CM | POA: Diagnosis not present

## 2020-02-25 DIAGNOSIS — I10 Essential (primary) hypertension: Secondary | ICD-10-CM | POA: Diagnosis not present

## 2020-02-25 NOTE — Telephone Encounter (Signed)
Daughter [name] called as she listened to VM in reference to referral... she states the pt currently is using [facility] for [facility] and would like to stick with them... Also, she requests for you to call her back and not the patient as her hearing is not the best

## 2020-02-29 ENCOUNTER — Ambulatory Visit: Payer: Medicare Other | Admitting: Physician Assistant

## 2020-02-29 DIAGNOSIS — E559 Vitamin D deficiency, unspecified: Secondary | ICD-10-CM | POA: Diagnosis not present

## 2020-02-29 DIAGNOSIS — N1831 Chronic kidney disease, stage 3a: Secondary | ICD-10-CM | POA: Diagnosis not present

## 2020-02-29 DIAGNOSIS — L97513 Non-pressure chronic ulcer of other part of right foot with necrosis of muscle: Secondary | ICD-10-CM | POA: Diagnosis not present

## 2020-02-29 DIAGNOSIS — I872 Venous insufficiency (chronic) (peripheral): Secondary | ICD-10-CM | POA: Diagnosis not present

## 2020-02-29 DIAGNOSIS — Z7982 Long term (current) use of aspirin: Secondary | ICD-10-CM | POA: Diagnosis not present

## 2020-02-29 DIAGNOSIS — I272 Pulmonary hypertension, unspecified: Secondary | ICD-10-CM | POA: Diagnosis not present

## 2020-02-29 DIAGNOSIS — M5136 Other intervertebral disc degeneration, lumbar region: Secondary | ICD-10-CM | POA: Diagnosis not present

## 2020-02-29 DIAGNOSIS — Z7952 Long term (current) use of systemic steroids: Secondary | ICD-10-CM | POA: Diagnosis not present

## 2020-02-29 DIAGNOSIS — I5032 Chronic diastolic (congestive) heart failure: Secondary | ICD-10-CM | POA: Diagnosis not present

## 2020-02-29 DIAGNOSIS — E7849 Other hyperlipidemia: Secondary | ICD-10-CM | POA: Diagnosis not present

## 2020-02-29 DIAGNOSIS — I10 Essential (primary) hypertension: Secondary | ICD-10-CM | POA: Diagnosis not present

## 2020-02-29 DIAGNOSIS — S62102D Fracture of unspecified carpal bone, left wrist, subsequent encounter for fracture with routine healing: Secondary | ICD-10-CM | POA: Diagnosis not present

## 2020-02-29 DIAGNOSIS — I2510 Atherosclerotic heart disease of native coronary artery without angina pectoris: Secondary | ICD-10-CM | POA: Diagnosis not present

## 2020-02-29 DIAGNOSIS — G9009 Other idiopathic peripheral autonomic neuropathy: Secondary | ICD-10-CM | POA: Diagnosis not present

## 2020-02-29 DIAGNOSIS — I4891 Unspecified atrial fibrillation: Secondary | ICD-10-CM | POA: Diagnosis not present

## 2020-02-29 DIAGNOSIS — E538 Deficiency of other specified B group vitamins: Secondary | ICD-10-CM | POA: Diagnosis not present

## 2020-02-29 DIAGNOSIS — Z7901 Long term (current) use of anticoagulants: Secondary | ICD-10-CM | POA: Diagnosis not present

## 2020-02-29 DIAGNOSIS — K219 Gastro-esophageal reflux disease without esophagitis: Secondary | ICD-10-CM | POA: Diagnosis not present

## 2020-02-29 DIAGNOSIS — M1990 Unspecified osteoarthritis, unspecified site: Secondary | ICD-10-CM | POA: Diagnosis not present

## 2020-02-29 DIAGNOSIS — M810 Age-related osteoporosis without current pathological fracture: Secondary | ICD-10-CM | POA: Diagnosis not present

## 2020-02-29 DIAGNOSIS — G2581 Restless legs syndrome: Secondary | ICD-10-CM | POA: Diagnosis not present

## 2020-02-29 DIAGNOSIS — I130 Hypertensive heart and chronic kidney disease with heart failure and stage 1 through stage 4 chronic kidney disease, or unspecified chronic kidney disease: Secondary | ICD-10-CM | POA: Diagnosis not present

## 2020-02-29 DIAGNOSIS — I739 Peripheral vascular disease, unspecified: Secondary | ICD-10-CM | POA: Diagnosis not present

## 2020-02-29 DIAGNOSIS — Z9181 History of falling: Secondary | ICD-10-CM | POA: Diagnosis not present

## 2020-03-03 DIAGNOSIS — I739 Peripheral vascular disease, unspecified: Secondary | ICD-10-CM | POA: Diagnosis not present

## 2020-03-03 DIAGNOSIS — I272 Pulmonary hypertension, unspecified: Secondary | ICD-10-CM | POA: Diagnosis not present

## 2020-03-03 DIAGNOSIS — M1990 Unspecified osteoarthritis, unspecified site: Secondary | ICD-10-CM | POA: Diagnosis not present

## 2020-03-03 DIAGNOSIS — Z9181 History of falling: Secondary | ICD-10-CM | POA: Diagnosis not present

## 2020-03-03 DIAGNOSIS — E7849 Other hyperlipidemia: Secondary | ICD-10-CM | POA: Diagnosis not present

## 2020-03-03 DIAGNOSIS — E559 Vitamin D deficiency, unspecified: Secondary | ICD-10-CM | POA: Diagnosis not present

## 2020-03-03 DIAGNOSIS — I130 Hypertensive heart and chronic kidney disease with heart failure and stage 1 through stage 4 chronic kidney disease, or unspecified chronic kidney disease: Secondary | ICD-10-CM | POA: Diagnosis not present

## 2020-03-03 DIAGNOSIS — M810 Age-related osteoporosis without current pathological fracture: Secondary | ICD-10-CM | POA: Diagnosis not present

## 2020-03-03 DIAGNOSIS — I10 Essential (primary) hypertension: Secondary | ICD-10-CM | POA: Diagnosis not present

## 2020-03-03 DIAGNOSIS — I4891 Unspecified atrial fibrillation: Secondary | ICD-10-CM | POA: Diagnosis not present

## 2020-03-03 DIAGNOSIS — M5136 Other intervertebral disc degeneration, lumbar region: Secondary | ICD-10-CM | POA: Diagnosis not present

## 2020-03-03 DIAGNOSIS — N1831 Chronic kidney disease, stage 3a: Secondary | ICD-10-CM | POA: Diagnosis not present

## 2020-03-03 DIAGNOSIS — Z7982 Long term (current) use of aspirin: Secondary | ICD-10-CM | POA: Diagnosis not present

## 2020-03-03 DIAGNOSIS — S62102D Fracture of unspecified carpal bone, left wrist, subsequent encounter for fracture with routine healing: Secondary | ICD-10-CM | POA: Diagnosis not present

## 2020-03-03 DIAGNOSIS — I2510 Atherosclerotic heart disease of native coronary artery without angina pectoris: Secondary | ICD-10-CM | POA: Diagnosis not present

## 2020-03-03 DIAGNOSIS — E538 Deficiency of other specified B group vitamins: Secondary | ICD-10-CM | POA: Diagnosis not present

## 2020-03-03 DIAGNOSIS — I5032 Chronic diastolic (congestive) heart failure: Secondary | ICD-10-CM | POA: Diagnosis not present

## 2020-03-03 DIAGNOSIS — Z7952 Long term (current) use of systemic steroids: Secondary | ICD-10-CM | POA: Diagnosis not present

## 2020-03-03 DIAGNOSIS — L97513 Non-pressure chronic ulcer of other part of right foot with necrosis of muscle: Secondary | ICD-10-CM | POA: Diagnosis not present

## 2020-03-03 DIAGNOSIS — I872 Venous insufficiency (chronic) (peripheral): Secondary | ICD-10-CM | POA: Diagnosis not present

## 2020-03-03 DIAGNOSIS — G2581 Restless legs syndrome: Secondary | ICD-10-CM | POA: Diagnosis not present

## 2020-03-03 DIAGNOSIS — G9009 Other idiopathic peripheral autonomic neuropathy: Secondary | ICD-10-CM | POA: Diagnosis not present

## 2020-03-03 DIAGNOSIS — K219 Gastro-esophageal reflux disease without esophagitis: Secondary | ICD-10-CM | POA: Diagnosis not present

## 2020-03-03 DIAGNOSIS — Z7901 Long term (current) use of anticoagulants: Secondary | ICD-10-CM | POA: Diagnosis not present

## 2020-03-04 DIAGNOSIS — I739 Peripheral vascular disease, unspecified: Secondary | ICD-10-CM | POA: Diagnosis not present

## 2020-03-04 DIAGNOSIS — Z7982 Long term (current) use of aspirin: Secondary | ICD-10-CM | POA: Diagnosis not present

## 2020-03-04 DIAGNOSIS — I4891 Unspecified atrial fibrillation: Secondary | ICD-10-CM | POA: Diagnosis not present

## 2020-03-04 DIAGNOSIS — I2510 Atherosclerotic heart disease of native coronary artery without angina pectoris: Secondary | ICD-10-CM | POA: Diagnosis not present

## 2020-03-04 DIAGNOSIS — M810 Age-related osteoporosis without current pathological fracture: Secondary | ICD-10-CM | POA: Diagnosis not present

## 2020-03-04 DIAGNOSIS — Z9181 History of falling: Secondary | ICD-10-CM | POA: Diagnosis not present

## 2020-03-04 DIAGNOSIS — S62102D Fracture of unspecified carpal bone, left wrist, subsequent encounter for fracture with routine healing: Secondary | ICD-10-CM | POA: Diagnosis not present

## 2020-03-04 DIAGNOSIS — I5032 Chronic diastolic (congestive) heart failure: Secondary | ICD-10-CM | POA: Diagnosis not present

## 2020-03-04 DIAGNOSIS — I130 Hypertensive heart and chronic kidney disease with heart failure and stage 1 through stage 4 chronic kidney disease, or unspecified chronic kidney disease: Secondary | ICD-10-CM | POA: Diagnosis not present

## 2020-03-04 DIAGNOSIS — Z7901 Long term (current) use of anticoagulants: Secondary | ICD-10-CM | POA: Diagnosis not present

## 2020-03-04 DIAGNOSIS — E7849 Other hyperlipidemia: Secondary | ICD-10-CM | POA: Diagnosis not present

## 2020-03-04 DIAGNOSIS — Z7952 Long term (current) use of systemic steroids: Secondary | ICD-10-CM | POA: Diagnosis not present

## 2020-03-04 DIAGNOSIS — N1831 Chronic kidney disease, stage 3a: Secondary | ICD-10-CM | POA: Diagnosis not present

## 2020-03-04 DIAGNOSIS — M5136 Other intervertebral disc degeneration, lumbar region: Secondary | ICD-10-CM | POA: Diagnosis not present

## 2020-03-04 DIAGNOSIS — I272 Pulmonary hypertension, unspecified: Secondary | ICD-10-CM | POA: Diagnosis not present

## 2020-03-04 DIAGNOSIS — G9009 Other idiopathic peripheral autonomic neuropathy: Secondary | ICD-10-CM | POA: Diagnosis not present

## 2020-03-04 DIAGNOSIS — K219 Gastro-esophageal reflux disease without esophagitis: Secondary | ICD-10-CM | POA: Diagnosis not present

## 2020-03-04 DIAGNOSIS — I10 Essential (primary) hypertension: Secondary | ICD-10-CM | POA: Diagnosis not present

## 2020-03-04 DIAGNOSIS — I872 Venous insufficiency (chronic) (peripheral): Secondary | ICD-10-CM | POA: Diagnosis not present

## 2020-03-04 DIAGNOSIS — E538 Deficiency of other specified B group vitamins: Secondary | ICD-10-CM | POA: Diagnosis not present

## 2020-03-04 DIAGNOSIS — L97513 Non-pressure chronic ulcer of other part of right foot with necrosis of muscle: Secondary | ICD-10-CM | POA: Diagnosis not present

## 2020-03-04 DIAGNOSIS — E559 Vitamin D deficiency, unspecified: Secondary | ICD-10-CM | POA: Diagnosis not present

## 2020-03-04 DIAGNOSIS — G2581 Restless legs syndrome: Secondary | ICD-10-CM | POA: Diagnosis not present

## 2020-03-04 DIAGNOSIS — M1990 Unspecified osteoarthritis, unspecified site: Secondary | ICD-10-CM | POA: Diagnosis not present

## 2020-03-06 ENCOUNTER — Encounter: Payer: Medicare Other | Admitting: Physician Assistant

## 2020-03-06 ENCOUNTER — Other Ambulatory Visit: Payer: Self-pay

## 2020-03-06 DIAGNOSIS — Z7901 Long term (current) use of anticoagulants: Secondary | ICD-10-CM | POA: Diagnosis not present

## 2020-03-06 DIAGNOSIS — L97512 Non-pressure chronic ulcer of other part of right foot with fat layer exposed: Secondary | ICD-10-CM | POA: Diagnosis not present

## 2020-03-06 DIAGNOSIS — M1990 Unspecified osteoarthritis, unspecified site: Secondary | ICD-10-CM | POA: Diagnosis not present

## 2020-03-06 DIAGNOSIS — I480 Paroxysmal atrial fibrillation: Secondary | ICD-10-CM | POA: Diagnosis not present

## 2020-03-06 DIAGNOSIS — I739 Peripheral vascular disease, unspecified: Secondary | ICD-10-CM | POA: Diagnosis not present

## 2020-03-07 DIAGNOSIS — Z7901 Long term (current) use of anticoagulants: Secondary | ICD-10-CM | POA: Diagnosis not present

## 2020-03-07 DIAGNOSIS — Z7982 Long term (current) use of aspirin: Secondary | ICD-10-CM | POA: Diagnosis not present

## 2020-03-07 DIAGNOSIS — I4891 Unspecified atrial fibrillation: Secondary | ICD-10-CM | POA: Diagnosis not present

## 2020-03-07 DIAGNOSIS — I10 Essential (primary) hypertension: Secondary | ICD-10-CM | POA: Diagnosis not present

## 2020-03-07 DIAGNOSIS — G2581 Restless legs syndrome: Secondary | ICD-10-CM | POA: Diagnosis not present

## 2020-03-07 DIAGNOSIS — I130 Hypertensive heart and chronic kidney disease with heart failure and stage 1 through stage 4 chronic kidney disease, or unspecified chronic kidney disease: Secondary | ICD-10-CM | POA: Diagnosis not present

## 2020-03-07 DIAGNOSIS — S62102D Fracture of unspecified carpal bone, left wrist, subsequent encounter for fracture with routine healing: Secondary | ICD-10-CM | POA: Diagnosis not present

## 2020-03-07 DIAGNOSIS — I272 Pulmonary hypertension, unspecified: Secondary | ICD-10-CM | POA: Diagnosis not present

## 2020-03-07 DIAGNOSIS — N1831 Chronic kidney disease, stage 3a: Secondary | ICD-10-CM | POA: Diagnosis not present

## 2020-03-07 DIAGNOSIS — I872 Venous insufficiency (chronic) (peripheral): Secondary | ICD-10-CM | POA: Diagnosis not present

## 2020-03-07 DIAGNOSIS — I2510 Atherosclerotic heart disease of native coronary artery without angina pectoris: Secondary | ICD-10-CM | POA: Diagnosis not present

## 2020-03-07 DIAGNOSIS — I5032 Chronic diastolic (congestive) heart failure: Secondary | ICD-10-CM | POA: Diagnosis not present

## 2020-03-07 DIAGNOSIS — E7849 Other hyperlipidemia: Secondary | ICD-10-CM | POA: Diagnosis not present

## 2020-03-07 DIAGNOSIS — Z9181 History of falling: Secondary | ICD-10-CM | POA: Diagnosis not present

## 2020-03-07 DIAGNOSIS — M5136 Other intervertebral disc degeneration, lumbar region: Secondary | ICD-10-CM | POA: Diagnosis not present

## 2020-03-07 DIAGNOSIS — E559 Vitamin D deficiency, unspecified: Secondary | ICD-10-CM | POA: Diagnosis not present

## 2020-03-07 DIAGNOSIS — G9009 Other idiopathic peripheral autonomic neuropathy: Secondary | ICD-10-CM | POA: Diagnosis not present

## 2020-03-07 DIAGNOSIS — Z7952 Long term (current) use of systemic steroids: Secondary | ICD-10-CM | POA: Diagnosis not present

## 2020-03-07 DIAGNOSIS — K219 Gastro-esophageal reflux disease without esophagitis: Secondary | ICD-10-CM | POA: Diagnosis not present

## 2020-03-07 DIAGNOSIS — E538 Deficiency of other specified B group vitamins: Secondary | ICD-10-CM | POA: Diagnosis not present

## 2020-03-07 DIAGNOSIS — L97513 Non-pressure chronic ulcer of other part of right foot with necrosis of muscle: Secondary | ICD-10-CM | POA: Diagnosis not present

## 2020-03-07 DIAGNOSIS — M810 Age-related osteoporosis without current pathological fracture: Secondary | ICD-10-CM | POA: Diagnosis not present

## 2020-03-07 DIAGNOSIS — M1990 Unspecified osteoarthritis, unspecified site: Secondary | ICD-10-CM | POA: Diagnosis not present

## 2020-03-07 DIAGNOSIS — I739 Peripheral vascular disease, unspecified: Secondary | ICD-10-CM | POA: Diagnosis not present

## 2020-03-08 DIAGNOSIS — I739 Peripheral vascular disease, unspecified: Secondary | ICD-10-CM | POA: Diagnosis not present

## 2020-03-08 DIAGNOSIS — E7849 Other hyperlipidemia: Secondary | ICD-10-CM | POA: Diagnosis not present

## 2020-03-08 DIAGNOSIS — I130 Hypertensive heart and chronic kidney disease with heart failure and stage 1 through stage 4 chronic kidney disease, or unspecified chronic kidney disease: Secondary | ICD-10-CM | POA: Diagnosis not present

## 2020-03-08 DIAGNOSIS — L97513 Non-pressure chronic ulcer of other part of right foot with necrosis of muscle: Secondary | ICD-10-CM | POA: Diagnosis not present

## 2020-03-08 DIAGNOSIS — G9009 Other idiopathic peripheral autonomic neuropathy: Secondary | ICD-10-CM | POA: Diagnosis not present

## 2020-03-08 DIAGNOSIS — M810 Age-related osteoporosis without current pathological fracture: Secondary | ICD-10-CM | POA: Diagnosis not present

## 2020-03-08 DIAGNOSIS — I5032 Chronic diastolic (congestive) heart failure: Secondary | ICD-10-CM | POA: Diagnosis not present

## 2020-03-08 DIAGNOSIS — M5136 Other intervertebral disc degeneration, lumbar region: Secondary | ICD-10-CM | POA: Diagnosis not present

## 2020-03-08 DIAGNOSIS — I2510 Atherosclerotic heart disease of native coronary artery without angina pectoris: Secondary | ICD-10-CM | POA: Diagnosis not present

## 2020-03-08 DIAGNOSIS — G2581 Restless legs syndrome: Secondary | ICD-10-CM | POA: Diagnosis not present

## 2020-03-08 DIAGNOSIS — I4891 Unspecified atrial fibrillation: Secondary | ICD-10-CM | POA: Diagnosis not present

## 2020-03-08 DIAGNOSIS — E559 Vitamin D deficiency, unspecified: Secondary | ICD-10-CM | POA: Diagnosis not present

## 2020-03-08 DIAGNOSIS — Z7982 Long term (current) use of aspirin: Secondary | ICD-10-CM | POA: Diagnosis not present

## 2020-03-08 DIAGNOSIS — L97511 Non-pressure chronic ulcer of other part of right foot limited to breakdown of skin: Secondary | ICD-10-CM | POA: Diagnosis not present

## 2020-03-08 DIAGNOSIS — I272 Pulmonary hypertension, unspecified: Secondary | ICD-10-CM | POA: Diagnosis not present

## 2020-03-08 DIAGNOSIS — I10 Essential (primary) hypertension: Secondary | ICD-10-CM | POA: Diagnosis not present

## 2020-03-08 DIAGNOSIS — Z7901 Long term (current) use of anticoagulants: Secondary | ICD-10-CM | POA: Diagnosis not present

## 2020-03-08 DIAGNOSIS — S62102D Fracture of unspecified carpal bone, left wrist, subsequent encounter for fracture with routine healing: Secondary | ICD-10-CM | POA: Diagnosis not present

## 2020-03-08 DIAGNOSIS — I872 Venous insufficiency (chronic) (peripheral): Secondary | ICD-10-CM | POA: Diagnosis not present

## 2020-03-08 DIAGNOSIS — Z9181 History of falling: Secondary | ICD-10-CM | POA: Diagnosis not present

## 2020-03-08 DIAGNOSIS — N1831 Chronic kidney disease, stage 3a: Secondary | ICD-10-CM | POA: Diagnosis not present

## 2020-03-08 DIAGNOSIS — Z7952 Long term (current) use of systemic steroids: Secondary | ICD-10-CM | POA: Diagnosis not present

## 2020-03-08 DIAGNOSIS — K219 Gastro-esophageal reflux disease without esophagitis: Secondary | ICD-10-CM | POA: Diagnosis not present

## 2020-03-08 DIAGNOSIS — M1990 Unspecified osteoarthritis, unspecified site: Secondary | ICD-10-CM | POA: Diagnosis not present

## 2020-03-08 DIAGNOSIS — E538 Deficiency of other specified B group vitamins: Secondary | ICD-10-CM | POA: Diagnosis not present

## 2020-03-10 DIAGNOSIS — E538 Deficiency of other specified B group vitamins: Secondary | ICD-10-CM | POA: Diagnosis not present

## 2020-03-10 DIAGNOSIS — M5136 Other intervertebral disc degeneration, lumbar region: Secondary | ICD-10-CM | POA: Diagnosis not present

## 2020-03-10 DIAGNOSIS — G2581 Restless legs syndrome: Secondary | ICD-10-CM | POA: Diagnosis not present

## 2020-03-10 DIAGNOSIS — Z7982 Long term (current) use of aspirin: Secondary | ICD-10-CM | POA: Diagnosis not present

## 2020-03-10 DIAGNOSIS — M1990 Unspecified osteoarthritis, unspecified site: Secondary | ICD-10-CM | POA: Diagnosis not present

## 2020-03-10 DIAGNOSIS — I739 Peripheral vascular disease, unspecified: Secondary | ICD-10-CM | POA: Diagnosis not present

## 2020-03-10 DIAGNOSIS — I5032 Chronic diastolic (congestive) heart failure: Secondary | ICD-10-CM | POA: Diagnosis not present

## 2020-03-10 DIAGNOSIS — I10 Essential (primary) hypertension: Secondary | ICD-10-CM | POA: Diagnosis not present

## 2020-03-10 DIAGNOSIS — M810 Age-related osteoporosis without current pathological fracture: Secondary | ICD-10-CM | POA: Diagnosis not present

## 2020-03-10 DIAGNOSIS — Z7952 Long term (current) use of systemic steroids: Secondary | ICD-10-CM | POA: Diagnosis not present

## 2020-03-10 DIAGNOSIS — I272 Pulmonary hypertension, unspecified: Secondary | ICD-10-CM | POA: Diagnosis not present

## 2020-03-10 DIAGNOSIS — L97511 Non-pressure chronic ulcer of other part of right foot limited to breakdown of skin: Secondary | ICD-10-CM | POA: Diagnosis not present

## 2020-03-10 DIAGNOSIS — I2510 Atherosclerotic heart disease of native coronary artery without angina pectoris: Secondary | ICD-10-CM | POA: Diagnosis not present

## 2020-03-10 DIAGNOSIS — G9009 Other idiopathic peripheral autonomic neuropathy: Secondary | ICD-10-CM | POA: Diagnosis not present

## 2020-03-10 DIAGNOSIS — Z9181 History of falling: Secondary | ICD-10-CM | POA: Diagnosis not present

## 2020-03-10 DIAGNOSIS — I872 Venous insufficiency (chronic) (peripheral): Secondary | ICD-10-CM | POA: Diagnosis not present

## 2020-03-10 DIAGNOSIS — K219 Gastro-esophageal reflux disease without esophagitis: Secondary | ICD-10-CM | POA: Diagnosis not present

## 2020-03-10 DIAGNOSIS — N1831 Chronic kidney disease, stage 3a: Secondary | ICD-10-CM | POA: Diagnosis not present

## 2020-03-10 DIAGNOSIS — I130 Hypertensive heart and chronic kidney disease with heart failure and stage 1 through stage 4 chronic kidney disease, or unspecified chronic kidney disease: Secondary | ICD-10-CM | POA: Diagnosis not present

## 2020-03-10 DIAGNOSIS — L97513 Non-pressure chronic ulcer of other part of right foot with necrosis of muscle: Secondary | ICD-10-CM | POA: Diagnosis not present

## 2020-03-10 DIAGNOSIS — E559 Vitamin D deficiency, unspecified: Secondary | ICD-10-CM | POA: Diagnosis not present

## 2020-03-10 DIAGNOSIS — Z7901 Long term (current) use of anticoagulants: Secondary | ICD-10-CM | POA: Diagnosis not present

## 2020-03-10 DIAGNOSIS — E7849 Other hyperlipidemia: Secondary | ICD-10-CM | POA: Diagnosis not present

## 2020-03-10 DIAGNOSIS — I4891 Unspecified atrial fibrillation: Secondary | ICD-10-CM | POA: Diagnosis not present

## 2020-03-10 DIAGNOSIS — S62102D Fracture of unspecified carpal bone, left wrist, subsequent encounter for fracture with routine healing: Secondary | ICD-10-CM | POA: Diagnosis not present

## 2020-03-10 NOTE — Progress Notes (Signed)
EMIYAH, SPRAGGINS ([id number]) Visit Report for 03/06/2020 Arrival Information Details Patient Name: [name], [name]. Date of Service: 03/06/2020 12:45 PM Medical Record Number: [id number] Patient Account Number: [id number] Date of Birth/Sex: 18-Jan-1930 (86 y.o. F) Treating RN: Cornell Barman Primary Care Enas Winchel: Denman George Other Clinician: Referring Esabella Stockinger: Denman George Treating Siddhi Dornbush/Extender: Skipper Cliche in Treatment: 85 Visit Information History Since Last Visit Has Dressing in Place as Prescribed: Yes Patient Arrived: [place] Pain Present Now: No Arrival Time: 12:53 Accompanied By: daughter Transfer Assistance: None Patient Identification Verified: Yes Secondary Verification Process Completed: Yes Patient Requires Transmission-Based No Precautions: Patient Has Alerts: Yes Patient Alerts: 08/31/19 ABI R)1.3 L) 1.31 08/31/19 TBI R).61 L).58 Electronic Signature(s) Signed: 03/10/2020 4:55:43 PM By: Gretta Cool, BSN, RN, CWS, Kim RN, BSN Entered By: Gretta Cool, BSN, RN, CWS, Kim on 03/06/2020 12:53:24 [name] ([id number]) -------------------------------------------------------------------------------- Clinic Level of Care Assessment Details Patient Name: [name], [name]. Date of Service: 03/06/2020 12:45 PM Medical Record Number: [id number] Patient Account Number: [id number] Date of Birth/Sex: 11-25-1929 (86 y.o. F) Treating RN: Cornell Barman Primary Care Anh Bigos: Denman George Other Clinician: Referring Adell Koval: Denman George Treating Nayda Riesen/Extender: Skipper Cliche in Treatment: 20 Clinic Level of Care Assessment Items TOOL 4 Quantity Score []  - Use when only an EandM is performed on FOLLOW-UP visit 0 ASSESSMENTS - Nursing Assessment / Reassessment X - Reassessment of Co-morbidities (includes updates in patient status) 1 10 X- 1 5 Reassessment of Adherence to Treatment Plan ASSESSMENTS - Wound and Skin Assessment / Reassessment X -  Simple Wound Assessment / Reassessment - one wound 1 5 []  - 0 Complex Wound Assessment / Reassessment - multiple wounds []  - 0 Dermatologic / Skin Assessment (not related to wound area) ASSESSMENTS - Focused Assessment []  - Circumferential Edema Measurements - multi extremities 0 []  - 0 Nutritional Assessment / Counseling / Intervention []  - 0 Lower Extremity Assessment (monofilament, tuning fork, pulses) []  - 0 Peripheral Arterial Disease Assessment (using hand held doppler) ASSESSMENTS - Ostomy and/or Continence Assessment and Care []  - Incontinence Assessment and Management 0 []  - 0 Ostomy Care Assessment and Management (repouching, etc.) PROCESS - Coordination of Care X - Simple Patient / Family Education for ongoing care 1 15 []  - 0 Complex (extensive) Patient / Family Education for ongoing care X- 1 10 Staff obtains Consents, Records, Test Results / Process Orders []  - 0 Staff telephones HHA, Nursing Homes / Clarify orders / etc []  - 0 Routine Transfer to another Facility (non-emergent condition) []  - 0 Routine Hospital Admission (non-emergent condition) []  - 0 New Admissions / Biomedical engineer / Ordering NPWT, Apligraf, etc. []  - 0 Emergency Hospital Admission (emergent condition) X- 1 10 Simple Discharge Coordination []  - 0 Complex (extensive) Discharge Coordination PROCESS - Special Needs []  - Pediatric / Minor Patient Management 0 []  - 0 Isolation Patient Management []  - 0 Hearing / Language / Visual special needs []  - 0 Assessment of Community assistance (transportation, D/C planning, etc.) []  - 0 Additional assistance / Altered mentation []  - 0 Support Surface(s) Assessment (bed, cushion, seat, etc.) INTERVENTIONS - Wound Cleansing / Measurement TAEKO, SCHAFFER. ([id number]) X- 1 5 Simple Wound Cleansing - one wound []  - 0 Complex Wound Cleansing - multiple wounds X- 1 5 Wound Imaging (photographs - any number of wounds) []  - 0 Wound  Tracing (instead of photographs) X- 1 5 Simple Wound Measurement - one wound []  - 0 Complex Wound Measurement - multiple wounds INTERVENTIONS - Wound Dressings []  - Small  Wound Dressing one or multiple wounds 0 X- 1 15 Medium Wound Dressing one or multiple wounds []  - 0 Large Wound Dressing one or multiple wounds []  - 0 Application of Medications - topical []  - 0 Application of Medications - injection INTERVENTIONS - Miscellaneous []  - External ear exam 0 []  - 0 Specimen Collection (cultures, biopsies, blood, body fluids, etc.) []  - 0 Specimen(s) / Culture(s) sent or taken to Lab for analysis []  - 0 Patient Transfer (multiple staff / Civil Service fast streamer / Similar devices) []  - 0 Simple Staple / Suture removal (25 or less) []  - 0 Complex Staple / Suture removal (26 or more) []  - 0 Hypo / Hyperglycemic Management (close monitor of Blood Glucose) []  - 0 Ankle / Brachial Index (ABI) - do not check if billed separately X- 1 5 Vital Signs Has the patient been seen at the hospital within the last three years: Yes Total Score: 90 Level Of Care: New/Established - Level 3 Electronic Signature(s) Signed: 03/10/2020 4:55:43 PM By: Gretta Cool, BSN, RN, CWS, Kim RN, BSN Entered By: Gretta Cool, BSN, RN, CWS, Kim on 03/06/2020 13:24:41 [name] ([id number]) -------------------------------------------------------------------------------- Encounter Discharge Information Details Patient Name: [name]. Date of Service: 03/06/2020 12:45 PM Medical Record Number: [id number] Patient Account Number: [id number] Date of Birth/Sex: Aug 23, 1929 (86 y.o. F) Treating RN: Cornell Barman Primary Care Jeral Zick: Denman George Other Clinician: Referring Lesia Monica: Denman George Treating Syndi Pua/Extender: Skipper Cliche in Treatment: 20 Encounter Discharge Information Items Discharge Condition: Stable Ambulatory Status: Ambulatory Discharge Destination: Home Transportation: Private  Auto Accompanied By: self Schedule Follow-up Appointment: Yes Clinical Summary of Care: Electronic Signature(s) Signed: 03/10/2020 4:55:43 PM By: Gretta Cool, BSN, RN, CWS, Kim RN, BSN Entered By: Gretta Cool, BSN, RN, CWS, Kim on 03/06/2020 13:30:14 [name] ([id number]) -------------------------------------------------------------------------------- Lower Extremity Assessment Details Patient Name: TEXANNA, HILBURN. Date of Service: 03/06/2020 12:45 PM Medical Record Number: [id number] Patient Account Number: [id number] Date of Birth/Sex: [birth year] (86 y.o. F) Treating RN: Cornell Barman Primary Care Arlin Savona: Denman George Other Clinician: Referring Zareen Jamison: Denman George Treating Latravious Levitt/Extender: Skipper Cliche in Treatment: 20 Edema Assessment Assessed: [Left: No] Patrice Paradise: Yes] [Left: Edema] [Right: :] Vascular Assessment Pulses: Dorsalis Pedis Palpable: [Right:Yes] Electronic Signature(s) Signed: 03/10/2020 4:55:43 PM By: Gretta Cool, BSN, RN, CWS, Kim RN, BSN Entered By: Gretta Cool, BSN, RN, CWS, Kim on 03/06/2020 13:07:08 [name] ([id number]) -------------------------------------------------------------------------------- Multi Wound Chart Details Patient Name: [name] Date of Service: 03/06/2020 12:45 PM Medical Record Number: [id number] Patient Account Number: [id number] Date of Birth/Sex: 31-Aug-1929 (86 y.o. F) Treating RN: Cornell Barman Primary Care Jasmain Ahlberg: Denman George Other Clinician: Referring Quantarius Genrich: Denman George Treating Zachari Alberta/Extender: Skipper Cliche in Treatment: 20 Vital Signs Height(in): 62 Pulse(bpm): 60 Weight(lbs): 140 Blood Pressure(mmHg): 185/77 Body Mass Index(BMI): 26 Temperature(F): 98.1 Respiratory Rate(breaths/min): 16 Photos: [N/A:N/A] Wound Location: Right Metatarsal head fifth N/A N/A Wounding Event: Gradually Appeared N/A N/A Primary Etiology: Arterial Insufficiency Ulcer N/A N/A Comorbid History:  Arrhythmia, Osteoarthritis N/A N/A Date Acquired: 04/09/2019 N/A N/A Weeks of Treatment: 20 N/A N/A Wound Status: Open N/A N/A Measurements L x W x D (cm) 0.7x0.4x0.2 N/A N/A Area (cm) : 0.22 N/A N/A Volume (cm) : 0.044 N/A N/A % Reduction in Area: 80.50% N/A N/A % Reduction in Volume: 61.10% N/A N/A Starting Position 1 (o'clock): 12 Ending Position 1 (o'clock): 4 Maximum Distance 1 (cm): 0.3 Undermining: Yes N/A N/A Classification: Full Thickness Without Exposed N/A N/A Support Structures Exudate Amount: Medium N/A N/A Exudate Type: Serosanguineous N/A N/A Exudate  Color: red, brown N/A N/A Wound Margin: Distinct, outline attached N/A N/A Granulation Amount: Medium (34-66%) N/A N/A Granulation Quality: Red N/A N/A Necrotic Amount: Medium (34-66%) N/A N/A Exposed Structures: Fat Layer (Subcutaneous Tissue): N/A N/A Yes Fascia: No Tendon: No Muscle: No Joint: No Bone: No Epithelialization: None N/A N/A Treatment Notes Electronic Signature(s) Signed: 03/10/2020 4:55:43 PM By: Gretta Cool, BSN, RN, CWS, Kim RN, BSN Entered By: Gretta Cool, BSN, RN, CWS, Kim on 03/06/2020 13:21:00 LYNEE, ROSENBACH ([id number]) CAYDENCE, KOENIG ([id number]) -------------------------------------------------------------------------------- [place] Details Patient Name: ILINA, XU. Date of Service: 03/06/2020 12:45 PM Medical Record Number: [id number] Patient Account Number: [id number] Date of Birth/Sex: [birth year] (86 y.o. F) Treating RN: Cornell Barman Primary Care Indra Wolters: Denman George Other Clinician: Referring Grayden Burley: Denman George Treating Samual Beals/Extender: Skipper Cliche in Treatment: 20 Active Inactive Abuse / Safety / Falls / Self Care Management Nursing Diagnoses: Potential for falls Goals: Patient/caregiver will verbalize understanding of skin care regimen Date Initiated: 10/12/2019 Target Resolution Date: 03/10/2020 Goal Status:  Active Interventions: Assess fall risk on admission and as needed Notes: Necrotic Tissue Nursing Diagnoses: Impaired tissue integrity related to necrotic/devitalized tissue Goals: Necrotic/devitalized tissue will be minimized in the wound bed Date Initiated: 10/12/2019 Target Resolution Date: 03/10/2020 Goal Status: Active Interventions: Assess patient pain level pre-, during and post procedure and prior to discharge Treatment Activities: Enzymatic debridement : 10/12/2019 Notes: Pain, Acute or Chronic Nursing Diagnoses: Potential alteration in comfort, pain Goals: Patient will verbalize adequate pain control and receive pain control interventions during procedures as needed Date Initiated: 10/12/2019 Target Resolution Date: 03/10/2020 Goal Status: Active Interventions: Complete pain assessment as per visit requirements Treatment Activities: Administer pain control measures as ordered : 10/12/2019 Notes: Peripheral Neuropathy Nursing Diagnoses: Knowledge deficit related to disease process and management of peripheral neurovascular dysfunction ELEANNA, THEILEN. ([id number]) Potential alteration in peripheral tissue perfusion (select prior to confirmation of diagnosis) Goals: Patient/caregiver will verbalize understanding of disease process and disease management Date Initiated: 10/12/2019 Target Resolution Date: 03/10/2020 Goal Status: Active Interventions: Assess signs and symptoms of neuropathy upon admission and as needed Provide education on Management of Neuropathy and Related Ulcers Notes: Electronic Signature(s) Signed: 03/10/2020 4:55:43 PM By: Gretta Cool, BSN, RN, CWS, Kim RN, BSN Entered By: Gretta Cool, BSN, RN, CWS, Kim on 03/06/2020 13:20:51 [name] ([id number]) -------------------------------------------------------------------------------- Pain Assessment Details Patient Name: [name]. Date of Service: 03/06/2020 12:45 PM Medical Record Number:  [id number] Patient Account Number: [id number] Date of Birth/Sex: 11/10/1929 (86 y.o. F) Treating RN: Cornell Barman Primary Care Asad Keeven: Denman George Other Clinician: Referring Lorie Cleckley: Denman George Treating Jessicca Stitzer/Extender: Skipper Cliche in Treatment: 20 Active Problems Location of Pain Severity and Description of Pain Patient Has Paino Yes Site Locations Pain Location: Generalized Pain, Pain in Ulcers With Dressing Change: Yes Character of Pain Describe the Pain: Aching Pain Management and Medication Current Pain Management: Electronic Signature(s) Signed: 03/10/2020 4:55:43 PM By: Gretta Cool, BSN, RN, CWS, Kim RN, BSN Entered By: Gretta Cool, BSN, RN, CWS, Kim on 03/06/2020 12:57:33 [name] ([id number]) -------------------------------------------------------------------------------- Patient/Caregiver Education Details Patient Name: [name] Date of Service: 03/06/2020 12:45 PM Medical Record Number: [id number] Patient Account Number: [id number] Date of Birth/Gender: [birth year] (86 y.o. F) Treating RN: Cornell Barman Primary Care Physician: Denman George Other Clinician: Referring Physician: Denman George Treating Physician/Extender: Skipper Cliche in Treatment: 20 Education Assessment Education Provided To: Patient Education Topics Provided Peripheral Neuropathy: Handouts: Neuropathy Methods: Demonstration, Explain/Verbal Responses: State content correctly Electronic Signature(s) Signed: 03/10/2020 4:55:43 PM By:  Gretta Cool, BSN, RN, CWS, Leisure centre manager, BSN Entered By: Gretta Cool, BSN, RN, CWS, Kim on 03/06/2020 13:29:22 [name] ([id number]) -------------------------------------------------------------------------------- Wound Assessment Details Patient Name: TATYM, SCHERMER. Date of Service: 03/06/2020 12:45 PM Medical Record Number: [id number] Patient Account Number: [id number] Date of Birth/Sex: 1929-04-13 (86 y.o. F) Treating RN: Cornell Barman Primary Care Simranjit Thayer: Denman George Other Clinician: Referring Laurey Salser: Denman George Treating Adhira Jamil/Extender: Skipper Cliche in Treatment: 20 Wound Status Wound Number: 3 Primary Etiology: Arterial Insufficiency Ulcer Wound Location: Right Metatarsal head fifth Wound Status: Open Wounding Event: Gradually Appeared Comorbid History: Arrhythmia, Osteoarthritis Date Acquired: 04/09/2019 Weeks Of Treatment: 20 Clustered Wound: No Photos Wound Measurements Length: (cm) 0.7 Width: (cm) 0.4 Depth: (cm) 0.2 Area: (cm) 0.22 Volume: (cm) 0.044 % Reduction in Area: 80.5% % Reduction in Volume: 61.1% Epithelialization: None Tunneling: No Undermining: Yes Starting Position (o'clock): 12 Ending Position (o'clock): 4 Maximum Distance: (cm) 0.3 Wound Description Classification: Full Thickness Without Exposed Support Structures Wound Margin: Distinct, outline attached Exudate Amount: Medium Exudate Type: Serosanguineous Exudate Color: red, brown Foul Odor After Cleansing: No Slough/Fibrino Yes Wound Bed Granulation Amount: Medium (34-66%) Exposed Structure Granulation Quality: Red Fascia Exposed: No Necrotic Amount: Medium (34-66%) Fat Layer (Subcutaneous Tissue) Exposed: Yes Necrotic Quality: Adherent Slough Tendon Exposed: No Muscle Exposed: No Joint Exposed: No Bone Exposed: No Treatment Notes Wound #3 (Right Metatarsal head fifth) LATIKA, KRONICK ([id number]) Notes scell, bordered foam dressing Electronic Signature(s) Signed: 03/10/2020 4:55:43 PM By: Gretta Cool, BSN, RN, CWS, Kim RN, BSN Entered By: Gretta Cool, BSN, RN, CWS, Kim on 03/06/2020 13:06:15 [name] ([id number]) -------------------------------------------------------------------------------- [place] Details Patient Name: [name]. Date of Service: 03/06/2020 12:45 PM Medical Record Number: [id number] Patient Account Number: [id number] Date of Birth/Sex: [birth year] (86 y.o.  F) Treating RN: Cornell Barman Primary Care Pamala Hayman: Denman George Other Clinician: Referring Shirla Hodgkiss: Denman George Treating Amer Alcindor/Extender: Skipper Cliche in Treatment: 20 Vital Signs Time Taken: 12:53 Temperature (F): 98.1 Height (in): 62 Pulse (bpm): 60 Weight (lbs): 140 Respiratory Rate (breaths/min): 16 Body Mass Index (BMI): 25.6 Blood Pressure (mmHg): 185/77 Reference Range: 80 - 120 mg / dl Notes Patient's PCP is currently monitoring her blood pressure and adjusting medications. Electronic Signature(s) Signed: 03/10/2020 4:55:43 PM By: Gretta Cool, BSN, RN, CWS, Kim RN, BSN Entered By: Gretta Cool, BSN, RN, CWS, Kim on 03/06/2020 12:56:32

## 2020-03-13 DIAGNOSIS — E538 Deficiency of other specified B group vitamins: Secondary | ICD-10-CM | POA: Diagnosis not present

## 2020-03-13 DIAGNOSIS — I5032 Chronic diastolic (congestive) heart failure: Secondary | ICD-10-CM | POA: Diagnosis not present

## 2020-03-13 DIAGNOSIS — Z7982 Long term (current) use of aspirin: Secondary | ICD-10-CM | POA: Diagnosis not present

## 2020-03-13 DIAGNOSIS — I272 Pulmonary hypertension, unspecified: Secondary | ICD-10-CM | POA: Diagnosis not present

## 2020-03-13 DIAGNOSIS — I739 Peripheral vascular disease, unspecified: Secondary | ICD-10-CM | POA: Diagnosis not present

## 2020-03-13 DIAGNOSIS — E7849 Other hyperlipidemia: Secondary | ICD-10-CM | POA: Diagnosis not present

## 2020-03-13 DIAGNOSIS — G9009 Other idiopathic peripheral autonomic neuropathy: Secondary | ICD-10-CM | POA: Diagnosis not present

## 2020-03-13 DIAGNOSIS — G2581 Restless legs syndrome: Secondary | ICD-10-CM | POA: Diagnosis not present

## 2020-03-13 DIAGNOSIS — I872 Venous insufficiency (chronic) (peripheral): Secondary | ICD-10-CM | POA: Diagnosis not present

## 2020-03-13 DIAGNOSIS — E559 Vitamin D deficiency, unspecified: Secondary | ICD-10-CM | POA: Diagnosis not present

## 2020-03-13 DIAGNOSIS — M1990 Unspecified osteoarthritis, unspecified site: Secondary | ICD-10-CM | POA: Diagnosis not present

## 2020-03-13 DIAGNOSIS — M5136 Other intervertebral disc degeneration, lumbar region: Secondary | ICD-10-CM | POA: Diagnosis not present

## 2020-03-13 DIAGNOSIS — K219 Gastro-esophageal reflux disease without esophagitis: Secondary | ICD-10-CM | POA: Diagnosis not present

## 2020-03-13 DIAGNOSIS — S62102D Fracture of unspecified carpal bone, left wrist, subsequent encounter for fracture with routine healing: Secondary | ICD-10-CM | POA: Diagnosis not present

## 2020-03-13 DIAGNOSIS — I10 Essential (primary) hypertension: Secondary | ICD-10-CM | POA: Diagnosis not present

## 2020-03-13 DIAGNOSIS — M810 Age-related osteoporosis without current pathological fracture: Secondary | ICD-10-CM | POA: Diagnosis not present

## 2020-03-13 DIAGNOSIS — I4891 Unspecified atrial fibrillation: Secondary | ICD-10-CM | POA: Diagnosis not present

## 2020-03-13 DIAGNOSIS — Z9181 History of falling: Secondary | ICD-10-CM | POA: Diagnosis not present

## 2020-03-13 DIAGNOSIS — N1831 Chronic kidney disease, stage 3a: Secondary | ICD-10-CM | POA: Diagnosis not present

## 2020-03-13 DIAGNOSIS — I2510 Atherosclerotic heart disease of native coronary artery without angina pectoris: Secondary | ICD-10-CM | POA: Diagnosis not present

## 2020-03-13 DIAGNOSIS — Z7952 Long term (current) use of systemic steroids: Secondary | ICD-10-CM | POA: Diagnosis not present

## 2020-03-13 DIAGNOSIS — I130 Hypertensive heart and chronic kidney disease with heart failure and stage 1 through stage 4 chronic kidney disease, or unspecified chronic kidney disease: Secondary | ICD-10-CM | POA: Diagnosis not present

## 2020-03-13 DIAGNOSIS — Z7901 Long term (current) use of anticoagulants: Secondary | ICD-10-CM | POA: Diagnosis not present

## 2020-03-13 DIAGNOSIS — L97513 Non-pressure chronic ulcer of other part of right foot with necrosis of muscle: Secondary | ICD-10-CM | POA: Diagnosis not present

## 2020-03-13 NOTE — Progress Notes (Signed)
Crystal, Payne ([id number]) Visit Report for 03/06/2020 Chief Complaint Document Details Patient Name: [name], [name]. Date of Service: 03/06/2020 12:45 PM Medical Record Number: [id number] Patient Account Number: [id number] Date of Birth/Sex: 02-06-1930 (86 y.o. F) Treating RN: Cornell Barman Primary Care Provider: Denman George Other Clinician: Referring Provider: Denman George Treating Provider/Extender: Skipper Cliche in Treatment: 20 Information Obtained from: Patient Chief Complaint Right Lateral Foot Ulcer Electronic Signature(s) Signed: 03/06/2020 1:06:14 PM By: Worthy Keeler PA-C Entered By: Worthy Keeler on 03/06/2020 13:06:14 [name], [name] ([id number]) -------------------------------------------------------------------------------- HPI Details Patient Name: [name] Date of Service: 03/06/2020 12:45 PM Medical Record Number: [id number] Patient Account Number: [id number] Date of Birth/Sex: 1929/04/22 (86 y.o. F) Treating RN: Cornell Barman Primary Care Provider: Denman George Other Clinician: Referring Provider: Denman George Treating Provider/Extender: Skipper Cliche in Treatment: 20 History of Present Illness HPI Description: 10/12/2019 upon evaluation today patient appears to be doing somewhat poorly in regard to her wound at this point. She has a ulcer over the right fifth metatarsal region. This has been managed by podiatry up to this point. Unfortunately the patient just does not seem to be making the progress that they were hoping for an subsequently the patient was referred to [place] by [facility] Dr. Posey Pronto. With that being said I did note that the patient does have peripheral vascular disease which has been managed by Dr. Lazaro Arms her most recent angiogram with intervention was on 08/02/2019. The ABIs following were on the right 1.30 with a TBI of 0.61 normal left 1.31 with a TBI of 0.58. Obviously things seem to be doing  somewhat better at this point hopefully enough to allow her to heal. She is on anticoagulant therapy due to atrial fibrillation long-term. With that being said the patient is unfortunately having quite a bit of discomfort at this time secondary to the wound. She also has erythema which is very likely secondary to infection. I am going obtain a culture of the area today once debridement is complete. Apparently the patient also had a x-ray that was performed by Dr. Posey Pronto on 09/21/2019 and this showed that the patient had no obvious signs on x-ray of cortical irregularity and no osteomyelitis obviously noted. With that being said as I discussed with the patient's daughter as well this does not indicate that the patient absolutely has no osteomyelitis as x-rays are only at best 50% helpful in identifying this complication. Nonetheless I do believe that we need to have the patient continue to have this further evaluated specifically I am to recommend that we check into an MRI which I think would be ideal to evaluate whether or not there is osteomyelitis present. 7/16; this is a patient with an ulcer over the right fifth metatarsal head. She [facility] with Dr. [facility]boy [facility]. She is due to have an MRI of the foot on 7/26. Using Santyl to the wound 11/04/2019 on evaluation today patient appears to be doing okay in regard to her wound at this time. Unfortunately since I last saw her which was actually her second visit here in the clinic she has moved into a new apartment and then subsequently had a fall. She ended up in the hospital she did fracture her left arm and subsequently has also been dealing with a lot of dizziness she has been at [employer]. She tells me that getting in the change the dressing has been somewhat difficult as far as daily dressing  changes are concerned with the Santyl. Nonetheless when they do change it that is what is been used and her daughter-in-law has also been  coming in to help out as well with the dressing changes to try to make sure it gets done daily. Fortunately there is no signs of active infection at this time. No fever chills noted. I do believe the Santyl has been beneficial and is likely still the best way to go at this point for the patient. I did review the patient's MRI which showed some potential for reactive osteitis but did not show any signs of osteomyelitis overtly. That cannot be excluded but again right now it does not appear that is very likely present as well. This is good news. I also did review her culture she has been on the Bactrim she tells me she has taken this and again that was appropriate based on the results of her culture. 11/19/2019 upon evaluation today patient appears to be doing decently well in regard to her wound. Fortunately there is no signs of active infection at this time wound is not significantly larger in fact is roughly about the same at this point. With that being said there is not as much erythema as previously noted I think the infection seems to be under much better control which is great news. Unfortunately she has fallen since I last saw her and broken her left arm she has an appointment with them following the appointment with me. 8/25; patient was worked in acutely today at the request of home health and was concerned about her right heel, generalized erythema in her feet bilaterally. According to her daughter things have been getting worse over the last for 5 days but not precisely from a wound care point of view. Her wound is on the lateral part of the right fifth metatarsal head looks about the same as I remember this. She has developed dryness and flaking of the skin on her plantar feet. As well a very tender area localized over the insertion point of the Achilles tendon. She has restless leg syndrome. Does not specifically offload the area at night. She wears socks at home and does not really give me  a pressure on the right heel issue. I reviewed her vascular status. She underwent her last revascularization on 08/02/2019 by Dr. Lucky Cowboy. This included an angioplasty of the right peroneal artery as well as the right posterior tibial artery. Her last noninvasive arterial study was on 11/23/2019. On the right her ABI was 1.08 TBI of 0.48. On the left ABI at 1.21 with a TBI of 0.54. Family relates that Dr. Lucky Cowboy felt that he had done the best he could and this. We have been using Santyl to the one wound she has on the right lateral fifth metatarsal head. She does not have a new wound today which is the concern that letter coming into the clinic 12/09/2019 on evaluation today patient appears to be doing decently well in regard to her foot ulcer at this point. She does note that with the compression wrap that the home health nurse put on after contacting our clinic that she actually did much better and felt much better. Her heel is also improved. Fortunately there is no signs of active infection at this time. No fevers, chills, nausea, vomiting, or diarrhea. 12/24/2019 upon evaluation today patient appears to be doing somewhat poorly still in regard to her foot. Unfortunately this is just not showing the signs of improvement that I  would like to see it is a little bit cleaner but also appears to be erythematous and I believe there is infection noted at this point. We will get a need to address this in my opinion. I did actually recommend that we switch back to the Iodoflex as well apparently the home health nurse had switch this away that not really what we want to do at this point in my opinion. They were using silver alginate I think most recently. 01/06/2020 upon evaluation today patient actually appears to be doing much better in regard to her wound. We have switched back to the silver alginate after her home health nurse contacted me and honestly I feel like she is doing quite well in that regard. She does have  a lot of drainage and therefore I think that is probably a better option for. With that being said she is tolerating the compression wrap without complication that also seems to be helping. 01/28/2020 on evaluation today patient appears to be doing well with regard to her foot ulcer. I do feel like she is showing signs of improvement which is great news. There is no signs of active infection at this time which is also good news. No fevers, chills, nausea, vomiting, or diarrhea. Unfortunately she did have a fall and sustained an injury to the left upper arm. She has a skin tear here this appears to be doing okay although BRYANT, LIPPS. ([id number]) the dressing was stuck to this this was an alginate dressing. I really think she probably needs something like Xeroform to try to prevent anything from sticking to this region. Other than that she seems to be doing quite well. 02/11/2020 on evaluation today patient's arm skin tear appears to be completely healed which is great. Unfortunately she is still having issues at this time with her foot although this seems to be showing signs of improvement it still very slow. There is no signs of active infection at this time. 03/06/2020 upon evaluation today patient appears to be doing decently well in regard to her foot ulcer. There is some minimal slough noted on the surface of the wound building up at this point. Otherwise I really feel like she is doing quite nicely with quite a bit of new granulation and epithelial growth. Electronic Signature(s) Signed: 03/06/2020 2:57:15 PM By: Worthy Keeler PA-C Entered By: Worthy Keeler on 03/06/2020 14:57:14 KEESHA, PELLUM ([id number]) -------------------------------------------------------------------------------- Physical Exam Details Patient Name: INDY, PRESTWOOD. Date of Service: 03/06/2020 12:45 PM Medical Record Number: [id number] Patient Account Number: [id number] Date of Birth/Sex: 09/16/1929 (86  y.o. F) Treating RN: Cornell Barman Primary Care Provider: Denman George Other Clinician: Referring Provider: Denman George Treating Provider/Extender: Skipper Cliche in Treatment: 81 Constitutional Well-nourished and well-hydrated in no acute distress. Respiratory normal breathing without difficulty. Psychiatric this patient is able to make decisions and demonstrates good insight into disease process. Alert and Oriented x 3. pleasant and cooperative. Notes Patient's wound bed did not require sharp debridement I did mechanically debride this which is saline and gauze she tolerated that today without complication post debridement the wound bed appears to be doing much better. Overall I am extremely pleased with the progress she is made considering the extent of the wound and her vascular status Electronic Signature(s) Signed: 03/06/2020 2:57:30 PM By: Worthy Keeler PA-C Entered By: Worthy Keeler on 03/06/2020 14:57:30 [name] ([id number]) -------------------------------------------------------------------------------- Physician Orders Details Patient Name: [name]. Date of Service: 03/06/2020 12:45  PM Medical Record Number: [id number] Patient Account Number: [id number] Date of Birth/Sex: 1930-02-12 (86 y.o. F) Treating RN: Cornell Barman Primary Care Provider: Denman George Other Clinician: Referring Provider: Denman George Treating Provider/Extender: Skipper Cliche in Treatment: 20 Verbal / Phone Orders: No Diagnosis Coding ICD-10 Coding Code Description I73.89 Other specified peripheral vascular diseases L97.512 Non-pressure chronic ulcer of other part of right foot with fat layer exposed S41.102A Unspecified open wound of left upper arm, initial encounter Z79.01 Long term (current) use of anticoagulants I48.0 Paroxysmal atrial fibrillation B35.3 Tinea pedis Wound Cleansing Wound #3 Right Metatarsal head fifth o Clean wound with  Normal Saline. o Dial antibacterial soap, wash wounds, rinse and pat dry prior to dressing wounds Primary Wound Dressing Wound #3 Right Metatarsal head fifth o Silver Alginate Secondary Dressing Wound #3 Right Metatarsal head fifth o Boardered Foam Dressing Dressing Change Frequency Wound #3 Right Metatarsal head fifth o Other: - Tuesday and Friday. HH to change Tues and Friday weeks patient does not come to the [facility] and on opposite weeks [place] to changed on Tuesday and [place] changes on Friday. Follow-up Appointments Wound #3 Right Metatarsal head fifth o Return Appointment in 2 weeks. Additional Orders / Instructions Wound #3 Right Metatarsal head fifth o Increase protein intake. [place] Visits - [place] Nurse may visit PRN to address patientos wound care needs. o FACE TO FACE ENCOUNTER: MEDICARE and MEDICAID PATIENTS: I certify that this patient is under my care and that I had a face-to-face encounter that meets the physician face-to-face encounter requirements with this patient on this date. The encounter with the patient was in whole or in part for the following MEDICAL CONDITION: (primary reason for [place]) MEDICAL NECESSITY: I certify, that based on my findings, NURSING services are a medically necessary home health service. HOME BOUND STATUS: I certify that my clinical findings support that this patient is homebound (i.e., Due to illness or injury, pt requires aid of supportive devices such as crutches, cane, wheelchairs, walkers, the use of special transportation or the assistance of another person to leave their place of residence. There is a normal inability to leave the home and doing so requires considerable and taxing effort. Other absences are for medical reasons / religious services and are infrequent or of short duration when for other reasons). o If current dressing causes regression in wound condition,  may D/C ordered dressing product/s and apply Normal Saline Moist Dressing daily until next [place] / Other MD appointment. [place] of regression in wound condition at [phone]. o Please direct any NON-WOUND related issues/requests for orders to patient's Primary Care Physician DORITA, ROWLANDS ([id number]) Electronic Signature(s) Signed: 03/06/2020 4:31:57 PM By: Worthy Keeler PA-C Signed: 03/10/2020 4:55:43 PM By: Gretta Cool, BSN, RN, CWS, Kim RN, BSN Entered By: Gretta Cool, BSN, RN, CWS, Kim on 03/06/2020 13:24:07 [name] ([id number]) -------------------------------------------------------------------------------- Problem List Details Patient Name: JULIANNA, VANWAGNER. Date of Service: 03/06/2020 12:45 PM Medical Record Number: [id number] Patient Account Number: [id number] Date of Birth/Sex: [birth year] (86 y.o. F) Treating RN: Cornell Barman Primary Care Provider: Denman George Other Clinician: Referring Provider: Denman George Treating Provider/Extender: Skipper Cliche in Treatment: 20 Active Problems ICD-10 Encounter Code Description Active Date MDM Diagnosis I73.89 Other specified peripheral vascular diseases 10/12/2019 No Yes L97.512 Non-pressure chronic ulcer of other part of right foot with fat layer 10/12/2019 No Yes exposed S41.102A Unspecified open wound of left upper arm, initial encounter  01/28/2020 No Yes Z79.01 Long term (current) use of anticoagulants 10/12/2019 No Yes I48.0 Paroxysmal atrial fibrillation 10/12/2019 No Yes B35.3 Tinea pedis 12/01/2019 No Yes Inactive Problems Resolved Problems Electronic Signature(s) Signed: 03/06/2020 1:06:06 PM By: Worthy Keeler PA-C Entered By: Worthy Keeler on 03/06/2020 13:06:06 [name] ([id number]) -------------------------------------------------------------------------------- Progress Note Details Patient Name: [name]. Date of Service: 03/06/2020 12:45  PM Medical Record Number: [id number] Patient Account Number: [id number] Date of Birth/Sex: October 29, 1929 (86 y.o. F) Treating RN: Cornell Barman Primary Care Provider: Denman George Other Clinician: Referring Provider: Denman George Treating Provider/Extender: Skipper Cliche in Treatment: 20 Subjective Chief Complaint Information obtained from Patient Right Lateral Foot Ulcer History of Present Illness (HPI) 10/12/2019 upon evaluation today patient appears to be doing somewhat poorly in regard to her wound at this point. She has a ulcer over the right fifth metatarsal region. This has been managed by podiatry up to this point. Unfortunately the patient just does not seem to be making the progress that they were hoping for an subsequently the patient was referred to [place] by [facility] Dr. Posey Pronto. With that being said I did note that the patient does have peripheral vascular disease which has been managed by Dr. Lazaro Arms her most recent angiogram with intervention was on 08/02/2019. The ABIs following were on the right 1.30 with a TBI of 0.61 normal left 1.31 with a TBI of 0.58. Obviously things seem to be doing somewhat better at this point hopefully enough to allow her to heal. She is on anticoagulant therapy due to atrial fibrillation long-term. With that being said the patient is unfortunately having quite a bit of discomfort at this time secondary to the wound. She also has erythema which is very likely secondary to infection. I am going obtain a culture of the area today once debridement is complete. Apparently the patient also had a x-ray that was performed by Dr. Posey Pronto on 09/21/2019 and this showed that the patient had no obvious signs on x-ray of cortical irregularity and no osteomyelitis obviously noted. With that being said as I discussed with the patient's daughter as well this does not indicate that the patient absolutely has no osteomyelitis as x-rays are only at best 50%  helpful in identifying this complication. Nonetheless I do believe that we need to have the patient continue to have this further evaluated specifically I am to recommend that we check into an MRI which I think would be ideal to evaluate whether or not there is osteomyelitis present. 7/16; this is a patient with an ulcer over the right fifth metatarsal head. She [facility] with [facility]. She is due to have an MRI of the foot on 7/26. Using Santyl to the wound 11/04/2019 on evaluation today patient appears to be doing okay in regard to her wound at this time. Unfortunately since I last saw her which was actually her second visit here in the clinic she has moved into a new apartment and then subsequently had a fall. She ended up in the hospital she did fracture her left arm and subsequently has also been dealing with a lot of dizziness she has been at [employer]. She tells me that getting in the change the dressing has been somewhat difficult as far as daily dressing changes are concerned with the Santyl. Nonetheless when they do change it that is what is been used and her daughter-in-law has also been coming in to help out  as well with the dressing changes to try to make sure it gets done daily. Fortunately there is no signs of active infection at this time. No fever chills noted. I do believe the Santyl has been beneficial and is likely still the best way to go at this point for the patient. I did review the patient's MRI which showed some potential for reactive osteitis but did not show any signs of osteomyelitis overtly. That cannot be excluded but again right now it does not appear that is very likely present as well. This is good news. I also did review her culture she has been on the Bactrim she tells me she has taken this and again that was appropriate based on the results of her culture. 11/19/2019 upon evaluation today patient appears to be doing decently well in regard  to her wound. Fortunately there is no signs of active infection at this time wound is not significantly larger in fact is roughly about the same at this point. With that being said there is not as much erythema as previously noted I think the infection seems to be under much better control which is great news. Unfortunately she has fallen since I last saw her and broken her left arm she has an appointment with them following the appointment with me. 8/25; patient was worked in acutely today at the request of home health and was concerned about her right heel, generalized erythema in her feet bilaterally. According to her daughter things have been getting worse over the last for 5 days but not precisely from a wound care point of view. Her wound is on the lateral part of the right fifth metatarsal head looks about the same as I remember this. She has developed dryness and flaking of the skin on her plantar feet. As well a very tender area localized over the insertion point of the Achilles tendon. She has restless leg syndrome. Does not specifically offload the area at night. She wears socks at home and does not really give me a pressure on the right heel issue. I reviewed her vascular status. She underwent her last revascularization on 08/02/2019 by Dr. Lucky Cowboy. This included an angioplasty of the right peroneal artery as well as the right posterior tibial artery. Her last noninvasive arterial study was on 11/23/2019. On the right her ABI was 1.08 TBI of 0.48. On the left ABI at 1.21 with a TBI of 0.54. Family relates that Dr. Lucky Cowboy felt that he had done the best he could and this. We have been using Santyl to the one wound she has on the right lateral fifth metatarsal head. She does not have a new wound today which is the concern that letter coming into the clinic 12/09/2019 on evaluation today patient appears to be doing decently well in regard to her foot ulcer at this point. She does note that with  the compression wrap that the home health nurse put on after contacting our clinic that she actually did much better and felt much better. Her heel is also improved. Fortunately there is no signs of active infection at this time. No fevers, chills, nausea, vomiting, or diarrhea. 12/24/2019 upon evaluation today patient appears to be doing somewhat poorly still in regard to her foot. Unfortunately this is just not showing the signs of improvement that I would like to see it is a little bit cleaner but also appears to be erythematous and I believe there is infection noted at this point. We will get  a need to address this in my opinion. I did actually recommend that we switch back to the Iodoflex as well apparently the home health nurse had switch this away that not really what we want to do at this point in my opinion. They were using silver alginate I think most recently. 01/06/2020 upon evaluation today patient actually appears to be doing much better in regard to her wound. We have switched back to the silver alginate after her home health nurse contacted me and honestly I feel like she is doing quite well in that regard. She does have a lot of drainage and therefore I think that is probably a better option for. With that being said she is tolerating the compression wrap without complication that ADELEINE, PASK. ([id number]) also seems to be helping. 01/28/2020 on evaluation today patient appears to be doing well with regard to her foot ulcer. I do feel like she is showing signs of improvement which is great news. There is no signs of active infection at this time which is also good news. No fevers, chills, nausea, vomiting, or diarrhea. Unfortunately she did have a fall and sustained an injury to the left upper arm. She has a skin tear here this appears to be doing okay although the dressing was stuck to this this was an alginate dressing. I really think she probably needs something like Xeroform to  try to prevent anything from sticking to this region. Other than that she seems to be doing quite well. 02/11/2020 on evaluation today patient's arm skin tear appears to be completely healed which is great. Unfortunately she is still having issues at this time with her foot although this seems to be showing signs of improvement it still very slow. There is no signs of active infection at this time. 03/06/2020 upon evaluation today patient appears to be doing decently well in regard to her foot ulcer. There is some minimal slough noted on the surface of the wound building up at this point. Otherwise I really feel like she is doing quite nicely with quite a bit of new granulation and epithelial growth. Objective Constitutional Well-nourished and well-hydrated in no acute distress. Vitals Time Taken: 12:53 PM, Height: 62 in, Weight: 140 lbs, BMI: 25.6, Temperature: 98.1 F, Pulse: 60 bpm, Respiratory Rate: 16 breaths/min, Blood Pressure: 185/77 mmHg. General Notes: Patient's PCP is currently monitoring her blood pressure and adjusting medications. Respiratory normal breathing without difficulty. Psychiatric this patient is able to make decisions and demonstrates good insight into disease process. Alert and Oriented x 3. pleasant and cooperative. General Notes: Patient's wound bed did not require sharp debridement I did mechanically debride this which is saline and gauze she tolerated that today without complication post debridement the wound bed appears to be doing much better. Overall I am extremely pleased with the progress she is made considering the extent of the wound and her vascular status Integumentary (Hair, Skin) Wound #3 status is Open. Original cause of wound was Gradually Appeared. The wound is located on the Right Metatarsal head fifth. The wound measures 0.7cm length x 0.4cm width x 0.2cm depth; 0.22cm^2 area and 0.044cm^3 volume. There is Fat Layer (Subcutaneous Tissue) exposed.  There is no tunneling noted, however, there is undermining starting at 12:00 and ending at 4:00 with a maximum distance of 0.3cm. There is a medium amount of serosanguineous drainage noted. The wound margin is distinct with the outline attached to the wound base. There is medium (34-66%) red granulation within the  wound bed. There is a medium (34-66%) amount of necrotic tissue within the wound bed including Adherent Slough. Assessment Active Problems ICD-10 Other specified peripheral vascular diseases Non-pressure chronic ulcer of other part of right foot with fat layer exposed Unspecified open wound of left upper arm, initial encounter Long term (current) use of anticoagulants Paroxysmal atrial fibrillation Tinea pedis Plan Wound Cleansing: RANDY, WHITENER. ([id number]) Wound #3 Right Metatarsal head fifth: Clean wound with Normal Saline. Dial antibacterial soap, wash wounds, rinse and pat dry prior to dressing wounds Primary Wound Dressing: Wound #3 Right Metatarsal head fifth: Silver Alginate Secondary Dressing: Wound #3 Right Metatarsal head fifth: Boardered Foam Dressing Dressing Change Frequency: Wound #3 Right Metatarsal head fifth: Other: - Tuesday and Friday. HH to change Tues and Friday weeks patient does not come to the [facility] and on opposite weeks [facility] to changed on Tuesday and [place] changes on Friday. Follow-up Appointments: Wound #3 Right Metatarsal head fifth: Return Appointment in 2 weeks. Additional Orders / Instructions: Wound #3 Right Metatarsal head fifth: Increase protein intake. Home Health: [facility] - [facility] Nurse may visit PRN to address patient s wound care needs. FACE TO FACE ENCOUNTER: MEDICARE and MEDICAID PATIENTS: I certify that this patient is under my care and that I had a face-to-face encounter that meets the physician face-to-face encounter requirements with this patient on this date. The encounter with the patient was  in whole or in part for the following MEDICAL CONDITION: (primary reason for [place]) MEDICAL NECESSITY: I certify, that based on my findings, NURSING services are a medically necessary home health service. HOME BOUND STATUS: I certify that my clinical findings support that this patient is homebound (i.e., Due to illness or injury, pt requires aid of supportive devices such as crutches, cane, wheelchairs, walkers, the use of special transportation or the assistance of another person to leave their place of residence. There is a normal inability to leave the home and doing so requires considerable and taxing effort. Other absences are for medical reasons / religious services and are infrequent or of short duration when for other reasons). If current dressing causes regression in wound condition, may D/C ordered dressing product/s and apply Normal Saline Moist Dressing daily until next [place] / Other MD appointment. [place] of regression in wound condition at [phone]. Please direct any NON-WOUND related issues/requests for orders to patient's Primary Care Physician 1. Would recommend at this time that we continue with the wound care measures as before utilizing the silver alginate dressing I think that still the best way to go at this point. 2. I am also can recommend at this time that we have the patient continue to use the bordered foam dressing I think this is better than doing a tight wrap on the foot that should not be done apparently last home health nurse that came out put a cotton layer and then [place] which is not really appropriate for this patient and what we are trying to do here. We will see patient back for reevaluation in 2 weeks here in the clinic. If anything worsens or changes patient will contact our office for additional recommendations. Electronic Signature(s) Signed: 03/06/2020 2:58:13 PM By: Worthy Keeler PA-C Entered By: Worthy Keeler on 03/06/2020 14:58:13 CARDELIA, SASSANO ([id number]) -------------------------------------------------------------------------------- SuperBill Details Patient Name: [name] Date of Service: 03/06/2020 Medical Record Number: [id number] Patient Account Number: [id number] Date of Birth/Sex: 18-Aug-1929 (86 y.o. F)  Treating RN: Cornell Barman Primary Care Provider: Denman George Other Clinician: Referring Provider: Denman George Treating Provider/Extender: Skipper Cliche in Treatment: 20 Diagnosis Coding ICD-10 Codes Code Description I73.89 Other specified peripheral vascular diseases L97.512 Non-pressure chronic ulcer of other part of right foot with fat layer exposed S41.102A Unspecified open wound of left upper arm, initial encounter Z79.01 Long term (current) use of anticoagulants I48.0 Paroxysmal atrial fibrillation B35.3 Tinea pedis Facility Procedures CPT4 Code: 92493241 Description: 99213 - WOUND CARE VISIT-LEV 3 EST PT Modifier: Quantity: 1 Physician Procedures CPT4 Code: 9914445 Description: 84835 - WC PHYS LEVEL 3 - EST PT Modifier: Quantity: 1 CPT4 Code: Description: ICD-10 Diagnosis Description I73.89 Other specified peripheral vascular diseases Modifier: Quantity: Electronic Signature(s) Signed: 03/06/2020 2:58:48 PM By: Worthy Keeler PA-C Entered By: Worthy Keeler on 03/06/2020 14:58:47

## 2020-03-14 ENCOUNTER — Other Ambulatory Visit: Payer: Self-pay | Admitting: Family Medicine

## 2020-03-14 DIAGNOSIS — Z7982 Long term (current) use of aspirin: Secondary | ICD-10-CM | POA: Diagnosis not present

## 2020-03-14 DIAGNOSIS — I130 Hypertensive heart and chronic kidney disease with heart failure and stage 1 through stage 4 chronic kidney disease, or unspecified chronic kidney disease: Secondary | ICD-10-CM | POA: Diagnosis not present

## 2020-03-14 DIAGNOSIS — I5032 Chronic diastolic (congestive) heart failure: Secondary | ICD-10-CM | POA: Diagnosis not present

## 2020-03-14 DIAGNOSIS — I272 Pulmonary hypertension, unspecified: Secondary | ICD-10-CM | POA: Diagnosis not present

## 2020-03-14 DIAGNOSIS — Z7952 Long term (current) use of systemic steroids: Secondary | ICD-10-CM | POA: Diagnosis not present

## 2020-03-14 DIAGNOSIS — I10 Essential (primary) hypertension: Secondary | ICD-10-CM | POA: Diagnosis not present

## 2020-03-14 DIAGNOSIS — L97511 Non-pressure chronic ulcer of other part of right foot limited to breakdown of skin: Secondary | ICD-10-CM | POA: Diagnosis not present

## 2020-03-14 DIAGNOSIS — E559 Vitamin D deficiency, unspecified: Secondary | ICD-10-CM | POA: Diagnosis not present

## 2020-03-14 DIAGNOSIS — I872 Venous insufficiency (chronic) (peripheral): Secondary | ICD-10-CM | POA: Diagnosis not present

## 2020-03-14 DIAGNOSIS — L97513 Non-pressure chronic ulcer of other part of right foot with necrosis of muscle: Secondary | ICD-10-CM | POA: Diagnosis not present

## 2020-03-14 DIAGNOSIS — S62102D Fracture of unspecified carpal bone, left wrist, subsequent encounter for fracture with routine healing: Secondary | ICD-10-CM | POA: Diagnosis not present

## 2020-03-14 DIAGNOSIS — I4891 Unspecified atrial fibrillation: Secondary | ICD-10-CM | POA: Diagnosis not present

## 2020-03-14 DIAGNOSIS — Z9181 History of falling: Secondary | ICD-10-CM | POA: Diagnosis not present

## 2020-03-14 DIAGNOSIS — G9009 Other idiopathic peripheral autonomic neuropathy: Secondary | ICD-10-CM | POA: Diagnosis not present

## 2020-03-14 DIAGNOSIS — I739 Peripheral vascular disease, unspecified: Secondary | ICD-10-CM | POA: Diagnosis not present

## 2020-03-14 DIAGNOSIS — E538 Deficiency of other specified B group vitamins: Secondary | ICD-10-CM | POA: Diagnosis not present

## 2020-03-14 DIAGNOSIS — M810 Age-related osteoporosis without current pathological fracture: Secondary | ICD-10-CM | POA: Diagnosis not present

## 2020-03-14 DIAGNOSIS — M5136 Other intervertebral disc degeneration, lumbar region: Secondary | ICD-10-CM | POA: Diagnosis not present

## 2020-03-14 DIAGNOSIS — M1990 Unspecified osteoarthritis, unspecified site: Secondary | ICD-10-CM | POA: Diagnosis not present

## 2020-03-14 DIAGNOSIS — E7849 Other hyperlipidemia: Secondary | ICD-10-CM | POA: Diagnosis not present

## 2020-03-14 DIAGNOSIS — Z7901 Long term (current) use of anticoagulants: Secondary | ICD-10-CM | POA: Diagnosis not present

## 2020-03-14 DIAGNOSIS — N1831 Chronic kidney disease, stage 3a: Secondary | ICD-10-CM | POA: Diagnosis not present

## 2020-03-14 DIAGNOSIS — I2510 Atherosclerotic heart disease of native coronary artery without angina pectoris: Secondary | ICD-10-CM | POA: Diagnosis not present

## 2020-03-14 DIAGNOSIS — K219 Gastro-esophageal reflux disease without esophagitis: Secondary | ICD-10-CM | POA: Diagnosis not present

## 2020-03-14 DIAGNOSIS — G2581 Restless legs syndrome: Secondary | ICD-10-CM | POA: Diagnosis not present

## 2020-03-14 NOTE — Telephone Encounter (Signed)
Pharmacy requests refill on: Memantine HCL 5 mg   LAST REFILL: 01/19/2020 (Q-60, R-1) LAST OV: 02/21/2020 NEXT OV: 03/22/2020 PHARMACY: [facility]

## 2020-03-16 DIAGNOSIS — G2581 Restless legs syndrome: Secondary | ICD-10-CM | POA: Diagnosis not present

## 2020-03-16 DIAGNOSIS — Z7901 Long term (current) use of anticoagulants: Secondary | ICD-10-CM | POA: Diagnosis not present

## 2020-03-16 DIAGNOSIS — N1831 Chronic kidney disease, stage 3a: Secondary | ICD-10-CM | POA: Diagnosis not present

## 2020-03-16 DIAGNOSIS — M5136 Other intervertebral disc degeneration, lumbar region: Secondary | ICD-10-CM | POA: Diagnosis not present

## 2020-03-16 DIAGNOSIS — I2510 Atherosclerotic heart disease of native coronary artery without angina pectoris: Secondary | ICD-10-CM | POA: Diagnosis not present

## 2020-03-16 DIAGNOSIS — S62102D Fracture of unspecified carpal bone, left wrist, subsequent encounter for fracture with routine healing: Secondary | ICD-10-CM | POA: Diagnosis not present

## 2020-03-16 DIAGNOSIS — M810 Age-related osteoporosis without current pathological fracture: Secondary | ICD-10-CM | POA: Diagnosis not present

## 2020-03-16 DIAGNOSIS — I272 Pulmonary hypertension, unspecified: Secondary | ICD-10-CM | POA: Diagnosis not present

## 2020-03-16 DIAGNOSIS — I10 Essential (primary) hypertension: Secondary | ICD-10-CM | POA: Diagnosis not present

## 2020-03-16 DIAGNOSIS — E559 Vitamin D deficiency, unspecified: Secondary | ICD-10-CM | POA: Diagnosis not present

## 2020-03-16 DIAGNOSIS — Z792 Long term (current) use of antibiotics: Secondary | ICD-10-CM | POA: Diagnosis not present

## 2020-03-16 DIAGNOSIS — I5032 Chronic diastolic (congestive) heart failure: Secondary | ICD-10-CM | POA: Diagnosis not present

## 2020-03-16 DIAGNOSIS — I872 Venous insufficiency (chronic) (peripheral): Secondary | ICD-10-CM | POA: Diagnosis not present

## 2020-03-16 DIAGNOSIS — L97513 Non-pressure chronic ulcer of other part of right foot with necrosis of muscle: Secondary | ICD-10-CM | POA: Diagnosis not present

## 2020-03-16 DIAGNOSIS — I4891 Unspecified atrial fibrillation: Secondary | ICD-10-CM | POA: Diagnosis not present

## 2020-03-16 DIAGNOSIS — K219 Gastro-esophageal reflux disease without esophagitis: Secondary | ICD-10-CM | POA: Diagnosis not present

## 2020-03-16 DIAGNOSIS — M1990 Unspecified osteoarthritis, unspecified site: Secondary | ICD-10-CM | POA: Diagnosis not present

## 2020-03-16 DIAGNOSIS — Z9181 History of falling: Secondary | ICD-10-CM | POA: Diagnosis not present

## 2020-03-16 DIAGNOSIS — I739 Peripheral vascular disease, unspecified: Secondary | ICD-10-CM | POA: Diagnosis not present

## 2020-03-16 DIAGNOSIS — I130 Hypertensive heart and chronic kidney disease with heart failure and stage 1 through stage 4 chronic kidney disease, or unspecified chronic kidney disease: Secondary | ICD-10-CM | POA: Diagnosis not present

## 2020-03-16 DIAGNOSIS — E7849 Other hyperlipidemia: Secondary | ICD-10-CM | POA: Diagnosis not present

## 2020-03-16 DIAGNOSIS — E538 Deficiency of other specified B group vitamins: Secondary | ICD-10-CM | POA: Diagnosis not present

## 2020-03-16 DIAGNOSIS — G9009 Other idiopathic peripheral autonomic neuropathy: Secondary | ICD-10-CM | POA: Diagnosis not present

## 2020-03-16 DIAGNOSIS — Z7982 Long term (current) use of aspirin: Secondary | ICD-10-CM | POA: Diagnosis not present

## 2020-03-17 ENCOUNTER — Telehealth: Payer: Self-pay

## 2020-03-17 DIAGNOSIS — Z792 Long term (current) use of antibiotics: Secondary | ICD-10-CM | POA: Diagnosis not present

## 2020-03-17 DIAGNOSIS — L97513 Non-pressure chronic ulcer of other part of right foot with necrosis of muscle: Secondary | ICD-10-CM | POA: Diagnosis not present

## 2020-03-17 DIAGNOSIS — E559 Vitamin D deficiency, unspecified: Secondary | ICD-10-CM | POA: Diagnosis not present

## 2020-03-17 DIAGNOSIS — S62102D Fracture of unspecified carpal bone, left wrist, subsequent encounter for fracture with routine healing: Secondary | ICD-10-CM | POA: Diagnosis not present

## 2020-03-17 DIAGNOSIS — I5032 Chronic diastolic (congestive) heart failure: Secondary | ICD-10-CM | POA: Diagnosis not present

## 2020-03-17 DIAGNOSIS — G2581 Restless legs syndrome: Secondary | ICD-10-CM | POA: Diagnosis not present

## 2020-03-17 DIAGNOSIS — E7849 Other hyperlipidemia: Secondary | ICD-10-CM | POA: Diagnosis not present

## 2020-03-17 DIAGNOSIS — Z7982 Long term (current) use of aspirin: Secondary | ICD-10-CM | POA: Diagnosis not present

## 2020-03-17 DIAGNOSIS — N1831 Chronic kidney disease, stage 3a: Secondary | ICD-10-CM | POA: Diagnosis not present

## 2020-03-17 DIAGNOSIS — I272 Pulmonary hypertension, unspecified: Secondary | ICD-10-CM | POA: Diagnosis not present

## 2020-03-17 DIAGNOSIS — I2510 Atherosclerotic heart disease of native coronary artery without angina pectoris: Secondary | ICD-10-CM | POA: Diagnosis not present

## 2020-03-17 DIAGNOSIS — G9009 Other idiopathic peripheral autonomic neuropathy: Secondary | ICD-10-CM | POA: Diagnosis not present

## 2020-03-17 DIAGNOSIS — I130 Hypertensive heart and chronic kidney disease with heart failure and stage 1 through stage 4 chronic kidney disease, or unspecified chronic kidney disease: Secondary | ICD-10-CM | POA: Diagnosis not present

## 2020-03-17 DIAGNOSIS — I10 Essential (primary) hypertension: Secondary | ICD-10-CM | POA: Diagnosis not present

## 2020-03-17 DIAGNOSIS — I4891 Unspecified atrial fibrillation: Secondary | ICD-10-CM | POA: Diagnosis not present

## 2020-03-17 DIAGNOSIS — M1990 Unspecified osteoarthritis, unspecified site: Secondary | ICD-10-CM | POA: Diagnosis not present

## 2020-03-17 DIAGNOSIS — Z9181 History of falling: Secondary | ICD-10-CM | POA: Diagnosis not present

## 2020-03-17 DIAGNOSIS — Z7901 Long term (current) use of anticoagulants: Secondary | ICD-10-CM | POA: Diagnosis not present

## 2020-03-17 DIAGNOSIS — I739 Peripheral vascular disease, unspecified: Secondary | ICD-10-CM | POA: Diagnosis not present

## 2020-03-17 DIAGNOSIS — M810 Age-related osteoporosis without current pathological fracture: Secondary | ICD-10-CM | POA: Diagnosis not present

## 2020-03-17 DIAGNOSIS — I872 Venous insufficiency (chronic) (peripheral): Secondary | ICD-10-CM | POA: Diagnosis not present

## 2020-03-17 DIAGNOSIS — E538 Deficiency of other specified B group vitamins: Secondary | ICD-10-CM | POA: Diagnosis not present

## 2020-03-17 DIAGNOSIS — M5136 Other intervertebral disc degeneration, lumbar region: Secondary | ICD-10-CM | POA: Diagnosis not present

## 2020-03-17 DIAGNOSIS — K219 Gastro-esophageal reflux disease without esophagitis: Secondary | ICD-10-CM | POA: Diagnosis not present

## 2020-03-17 NOTE — Telephone Encounter (Signed)
Received and in [name]'s box.

## 2020-03-17 NOTE — Telephone Encounter (Signed)
Received faxed [name] for wound care.  Placed order in Dr. Synthia Innocent box.

## 2020-03-17 NOTE — Telephone Encounter (Signed)
Faxed order

## 2020-03-20 DIAGNOSIS — M5136 Other intervertebral disc degeneration, lumbar region: Secondary | ICD-10-CM | POA: Diagnosis not present

## 2020-03-20 DIAGNOSIS — Z9181 History of falling: Secondary | ICD-10-CM | POA: Diagnosis not present

## 2020-03-20 DIAGNOSIS — L97513 Non-pressure chronic ulcer of other part of right foot with necrosis of muscle: Secondary | ICD-10-CM | POA: Diagnosis not present

## 2020-03-20 DIAGNOSIS — M1990 Unspecified osteoarthritis, unspecified site: Secondary | ICD-10-CM | POA: Diagnosis not present

## 2020-03-20 DIAGNOSIS — K219 Gastro-esophageal reflux disease without esophagitis: Secondary | ICD-10-CM | POA: Diagnosis not present

## 2020-03-20 DIAGNOSIS — I872 Venous insufficiency (chronic) (peripheral): Secondary | ICD-10-CM | POA: Diagnosis not present

## 2020-03-20 DIAGNOSIS — S62102D Fracture of unspecified carpal bone, left wrist, subsequent encounter for fracture with routine healing: Secondary | ICD-10-CM | POA: Diagnosis not present

## 2020-03-20 DIAGNOSIS — Z7901 Long term (current) use of anticoagulants: Secondary | ICD-10-CM | POA: Diagnosis not present

## 2020-03-20 DIAGNOSIS — E538 Deficiency of other specified B group vitamins: Secondary | ICD-10-CM | POA: Diagnosis not present

## 2020-03-20 DIAGNOSIS — I739 Peripheral vascular disease, unspecified: Secondary | ICD-10-CM | POA: Diagnosis not present

## 2020-03-20 DIAGNOSIS — I10 Essential (primary) hypertension: Secondary | ICD-10-CM | POA: Diagnosis not present

## 2020-03-20 DIAGNOSIS — G9009 Other idiopathic peripheral autonomic neuropathy: Secondary | ICD-10-CM | POA: Diagnosis not present

## 2020-03-20 DIAGNOSIS — I2510 Atherosclerotic heart disease of native coronary artery without angina pectoris: Secondary | ICD-10-CM | POA: Diagnosis not present

## 2020-03-20 DIAGNOSIS — E559 Vitamin D deficiency, unspecified: Secondary | ICD-10-CM | POA: Diagnosis not present

## 2020-03-20 DIAGNOSIS — I5032 Chronic diastolic (congestive) heart failure: Secondary | ICD-10-CM | POA: Diagnosis not present

## 2020-03-20 DIAGNOSIS — M810 Age-related osteoporosis without current pathological fracture: Secondary | ICD-10-CM | POA: Diagnosis not present

## 2020-03-20 DIAGNOSIS — I272 Pulmonary hypertension, unspecified: Secondary | ICD-10-CM | POA: Diagnosis not present

## 2020-03-20 DIAGNOSIS — I130 Hypertensive heart and chronic kidney disease with heart failure and stage 1 through stage 4 chronic kidney disease, or unspecified chronic kidney disease: Secondary | ICD-10-CM | POA: Diagnosis not present

## 2020-03-20 DIAGNOSIS — G2581 Restless legs syndrome: Secondary | ICD-10-CM | POA: Diagnosis not present

## 2020-03-20 DIAGNOSIS — Z792 Long term (current) use of antibiotics: Secondary | ICD-10-CM | POA: Diagnosis not present

## 2020-03-20 DIAGNOSIS — Z7982 Long term (current) use of aspirin: Secondary | ICD-10-CM | POA: Diagnosis not present

## 2020-03-20 DIAGNOSIS — I4891 Unspecified atrial fibrillation: Secondary | ICD-10-CM | POA: Diagnosis not present

## 2020-03-20 DIAGNOSIS — E7849 Other hyperlipidemia: Secondary | ICD-10-CM | POA: Diagnosis not present

## 2020-03-20 DIAGNOSIS — N1831 Chronic kidney disease, stage 3a: Secondary | ICD-10-CM | POA: Diagnosis not present

## 2020-03-21 DIAGNOSIS — I872 Venous insufficiency (chronic) (peripheral): Secondary | ICD-10-CM | POA: Diagnosis not present

## 2020-03-21 DIAGNOSIS — I4891 Unspecified atrial fibrillation: Secondary | ICD-10-CM | POA: Diagnosis not present

## 2020-03-21 DIAGNOSIS — E7849 Other hyperlipidemia: Secondary | ICD-10-CM | POA: Diagnosis not present

## 2020-03-21 DIAGNOSIS — I2510 Atherosclerotic heart disease of native coronary artery without angina pectoris: Secondary | ICD-10-CM | POA: Diagnosis not present

## 2020-03-21 DIAGNOSIS — Z7901 Long term (current) use of anticoagulants: Secondary | ICD-10-CM | POA: Diagnosis not present

## 2020-03-21 DIAGNOSIS — I5032 Chronic diastolic (congestive) heart failure: Secondary | ICD-10-CM | POA: Diagnosis not present

## 2020-03-21 DIAGNOSIS — G2581 Restless legs syndrome: Secondary | ICD-10-CM | POA: Diagnosis not present

## 2020-03-21 DIAGNOSIS — M5136 Other intervertebral disc degeneration, lumbar region: Secondary | ICD-10-CM | POA: Diagnosis not present

## 2020-03-21 DIAGNOSIS — Z9181 History of falling: Secondary | ICD-10-CM | POA: Diagnosis not present

## 2020-03-21 DIAGNOSIS — G9009 Other idiopathic peripheral autonomic neuropathy: Secondary | ICD-10-CM | POA: Diagnosis not present

## 2020-03-21 DIAGNOSIS — I130 Hypertensive heart and chronic kidney disease with heart failure and stage 1 through stage 4 chronic kidney disease, or unspecified chronic kidney disease: Secondary | ICD-10-CM | POA: Diagnosis not present

## 2020-03-21 DIAGNOSIS — M1990 Unspecified osteoarthritis, unspecified site: Secondary | ICD-10-CM | POA: Diagnosis not present

## 2020-03-21 DIAGNOSIS — I739 Peripheral vascular disease, unspecified: Secondary | ICD-10-CM | POA: Diagnosis not present

## 2020-03-21 DIAGNOSIS — E538 Deficiency of other specified B group vitamins: Secondary | ICD-10-CM | POA: Diagnosis not present

## 2020-03-21 DIAGNOSIS — L97513 Non-pressure chronic ulcer of other part of right foot with necrosis of muscle: Secondary | ICD-10-CM | POA: Diagnosis not present

## 2020-03-21 DIAGNOSIS — S62102D Fracture of unspecified carpal bone, left wrist, subsequent encounter for fracture with routine healing: Secondary | ICD-10-CM | POA: Diagnosis not present

## 2020-03-21 DIAGNOSIS — N1831 Chronic kidney disease, stage 3a: Secondary | ICD-10-CM | POA: Diagnosis not present

## 2020-03-21 DIAGNOSIS — E559 Vitamin D deficiency, unspecified: Secondary | ICD-10-CM | POA: Diagnosis not present

## 2020-03-21 DIAGNOSIS — M810 Age-related osteoporosis without current pathological fracture: Secondary | ICD-10-CM | POA: Diagnosis not present

## 2020-03-21 DIAGNOSIS — Z792 Long term (current) use of antibiotics: Secondary | ICD-10-CM | POA: Diagnosis not present

## 2020-03-21 DIAGNOSIS — I272 Pulmonary hypertension, unspecified: Secondary | ICD-10-CM | POA: Diagnosis not present

## 2020-03-21 DIAGNOSIS — I10 Essential (primary) hypertension: Secondary | ICD-10-CM | POA: Diagnosis not present

## 2020-03-21 DIAGNOSIS — K219 Gastro-esophageal reflux disease without esophagitis: Secondary | ICD-10-CM | POA: Diagnosis not present

## 2020-03-21 DIAGNOSIS — Z7982 Long term (current) use of aspirin: Secondary | ICD-10-CM | POA: Diagnosis not present

## 2020-03-22 ENCOUNTER — Ambulatory Visit (INDEPENDENT_AMBULATORY_CARE_PROVIDER_SITE_OTHER): Payer: Medicare Other | Admitting: Family Medicine

## 2020-03-22 ENCOUNTER — Other Ambulatory Visit: Payer: Self-pay

## 2020-03-22 ENCOUNTER — Encounter: Payer: Self-pay | Admitting: Family Medicine

## 2020-03-22 VITALS — BP 130/70 | HR 55 | Temp 97.8°F | Ht 62.0 in | Wt 135.1 lb

## 2020-03-22 DIAGNOSIS — M1990 Unspecified osteoarthritis, unspecified site: Secondary | ICD-10-CM

## 2020-03-22 DIAGNOSIS — Z9181 History of falling: Secondary | ICD-10-CM

## 2020-03-22 DIAGNOSIS — Z66 Do not resuscitate: Secondary | ICD-10-CM

## 2020-03-22 DIAGNOSIS — G9009 Other idiopathic peripheral autonomic neuropathy: Secondary | ICD-10-CM

## 2020-03-22 DIAGNOSIS — S62102D Fracture of unspecified carpal bone, left wrist, subsequent encounter for fracture with routine healing: Secondary | ICD-10-CM

## 2020-03-22 DIAGNOSIS — R2681 Unsteadiness on feet: Secondary | ICD-10-CM | POA: Diagnosis not present

## 2020-03-22 DIAGNOSIS — I4891 Unspecified atrial fibrillation: Secondary | ICD-10-CM

## 2020-03-22 DIAGNOSIS — F419 Anxiety disorder, unspecified: Secondary | ICD-10-CM

## 2020-03-22 DIAGNOSIS — Z515 Encounter for palliative care: Secondary | ICD-10-CM

## 2020-03-22 DIAGNOSIS — I2510 Atherosclerotic heart disease of native coronary artery without angina pectoris: Secondary | ICD-10-CM

## 2020-03-22 DIAGNOSIS — G3184 Mild cognitive impairment, so stated: Secondary | ICD-10-CM

## 2020-03-22 DIAGNOSIS — I739 Peripheral vascular disease, unspecified: Secondary | ICD-10-CM

## 2020-03-22 DIAGNOSIS — E559 Vitamin D deficiency, unspecified: Secondary | ICD-10-CM

## 2020-03-22 DIAGNOSIS — L97513 Non-pressure chronic ulcer of other part of right foot with necrosis of muscle: Secondary | ICD-10-CM

## 2020-03-22 DIAGNOSIS — I10 Essential (primary) hypertension: Secondary | ICD-10-CM

## 2020-03-22 DIAGNOSIS — G2581 Restless legs syndrome: Secondary | ICD-10-CM

## 2020-03-22 DIAGNOSIS — E7849 Other hyperlipidemia: Secondary | ICD-10-CM

## 2020-03-22 DIAGNOSIS — Z7901 Long term (current) use of anticoagulants: Secondary | ICD-10-CM

## 2020-03-22 DIAGNOSIS — I130 Hypertensive heart and chronic kidney disease with heart failure and stage 1 through stage 4 chronic kidney disease, or unspecified chronic kidney disease: Secondary | ICD-10-CM

## 2020-03-22 DIAGNOSIS — N1831 Chronic kidney disease, stage 3a: Secondary | ICD-10-CM

## 2020-03-22 DIAGNOSIS — Z7982 Long term (current) use of aspirin: Secondary | ICD-10-CM

## 2020-03-22 DIAGNOSIS — G6289 Other specified polyneuropathies: Secondary | ICD-10-CM | POA: Diagnosis not present

## 2020-03-22 DIAGNOSIS — M810 Age-related osteoporosis without current pathological fracture: Secondary | ICD-10-CM

## 2020-03-22 DIAGNOSIS — E538 Deficiency of other specified B group vitamins: Secondary | ICD-10-CM

## 2020-03-22 DIAGNOSIS — L989 Disorder of the skin and subcutaneous tissue, unspecified: Secondary | ICD-10-CM | POA: Diagnosis not present

## 2020-03-22 DIAGNOSIS — I872 Venous insufficiency (chronic) (peripheral): Secondary | ICD-10-CM

## 2020-03-22 DIAGNOSIS — I272 Pulmonary hypertension, unspecified: Secondary | ICD-10-CM

## 2020-03-22 DIAGNOSIS — M5136 Other intervertebral disc degeneration, lumbar region: Secondary | ICD-10-CM

## 2020-03-22 DIAGNOSIS — K5909 Other constipation: Secondary | ICD-10-CM

## 2020-03-22 DIAGNOSIS — I5032 Chronic diastolic (congestive) heart failure: Secondary | ICD-10-CM

## 2020-03-22 DIAGNOSIS — K219 Gastro-esophageal reflux disease without esophagitis: Secondary | ICD-10-CM

## 2020-03-22 DIAGNOSIS — Z792 Long term (current) use of antibiotics: Secondary | ICD-10-CM

## 2020-03-22 MED ORDER — CYANOCOBALAMIN 1000 MCG/ML IJ SOLN
1000.0000 ug | Freq: Once | INTRAMUSCULAR | Status: AC
  Administered 2020-03-22: 14:00:00 1000 ug via INTRAMUSCULAR

## 2020-03-22 NOTE — Progress Notes (Signed)
Patient ID: [name], female    DOB: [birth year], 86 y.o.   MRN: [id number]  This visit was conducted in person.  BP 130/70 Comment: home reading she brings in from [facility] RN  Pulse (!) 55   Temp 97.8 F (36.6 C) (Temporal)   Ht 5\' 2"  (1.575 m)   Wt 135 lb 2 oz (61.3 kg)   SpO2 96%   BMI 24.71 kg/m   190/74 on my recheck, 159/79 on her home wrist cuff at same time 120-140/70 on recent [facility] checks.   CC: 4 wk f/u visit  Subjective:   HPI: [name] is a 86 y.o. female presenting on 03/22/2020 for Follow-up (Here for 4 wk f/u.  Pt accompanied by daughter, [name]- temp 97.7.  Pt brought in home BP monitor to compare.  Reading today in office, 159/111. )   Planning on getting COVID booster.   HTN - Compliant with current antihypertensive regimen of lisinopril 10mg  daily, metoprolol 12.5mg  bid. Does check blood pressures at home: brings BP cuff which shows BP 159/111 today (in office 164/80). BPs from [facility] actually much better controlled - 120-142/70. Denies HA, vision changes, CP/tightness. Ongoing dizziness/lightheadedness. Ongoing dyspnea. Ongoing dependent leg swelling.   Gabapentin dose has been dropped to 300mg  QHS.   Working with [place] for assistance of aide at home. Also reaching out to [employer] for resources.   Several months of R skin lesion just below neck. Not healing well. Has not seen derm. Will refer.   Continues seeing wound clinic - good report on latest check. Upcoming appt tomorrow.   Chronic constipation x3 wks - worsening recently. Manages with miralax daily PRN, enemas PRN. Has been using miralax daily. Wonder if namenda related (started 3 mo ago). Occasional hemorrhoid flares. Tolerating miralax well.   Recent worsening RLS overnight despite requip 2.5mg  nightly, tylenol and tramadol, managed with tizanidine with benefit.   HHPT started last week.      Relevant past medical, surgical, family and social history reviewed and updated as  indicated. Interim medical history since our last visit reviewed. Allergies and medications reviewed and updated. Outpatient Medications Prior to Visit  Medication Sig Dispense Refill  . acetaminophen (TYLENOL) 500 MG tablet Take 1,000 mg every 8 (eight) hours as needed by mouth for moderate pain.     Marland Kitchen acyclovir (ZOVIRAX) 400 MG tablet Take 400 mg by mouth 2 (two) times daily.    Marland Kitchen amiodarone (PACERONE) 100 MG tablet Take 1 tablet (100 mg total) by mouth daily. (Patient taking differently: Take 100 mg by mouth daily. Takes 1/2 tablet in morning.)    . aspirin EC 81 MG tablet Take 1 tablet (81 mg total) by mouth daily. 150 tablet 2  . atorvastatin (LIPITOR) 20 MG tablet TAKE 1 TABLET BY MOUTH ONCE DAILY (Patient taking differently: Take 20 mg by mouth daily.) 90 tablet 1  . DULoxetine (CYMBALTA) 60 MG capsule TAKE 1 CAPSULE BY MOUTH ONCE DAILY (Patient taking differently: Take 60 mg by mouth daily.) 90 capsule 1  . ELIQUIS 2.5 MG TABS tablet TAKE 1 TABLET BY MOUTH TWICE DAILY (Patient taking differently: Take 2.5 mg by mouth 2 (two) times daily.) 180 tablet 1  . ergocalciferol (VITAMIN D2) 1.25 MG (50000 UT) capsule Take 1 capsule (50,000 Units total) by mouth once a week. (Patient taking differently: Take 50,000 Units by mouth once a week. Saturday) 12 capsule 3  . lisinopril (ZESTRIL) 10 MG tablet Take 1 tablet (10 mg total)  by mouth daily. 30 tablet 6  . memantine (NAMENDA) 5 MG tablet TAKE 1 TABLET BY MOUTH TWICE DAILY 60 tablet 0  . metoprolol tartrate (LOPRESSOR) 25 MG tablet TAKE 1/2 TABLET BY MOUTH TWICE DAILY 90 tablet 3  . pantoprazole (PROTONIX) 40 MG tablet TAKE 1 TABLET BY MOUTH EVERY DAY (Patient taking differently: Take 40 mg by mouth daily.) 90 tablet 1  . polyethylene glycol (MIRALAX / GLYCOLAX) 17 g packet Take 17 g by mouth daily as needed for mild constipation. 14 each 0  . prednisoLONE acetate (PRED FORTE) 1 % ophthalmic suspension Place 1 drop into the left eye daily.     Marland Kitchen  rOPINIRole (REQUIP) 1 MG tablet Take 2.5 tablets (2.5 mg total) by mouth at bedtime.    . traMADol (ULTRAM) 50 MG tablet Take 50 mg by mouth daily. Takes 2 tablets daily  0  . gabapentin (NEURONTIN) 300 MG capsule TAKE 1 CAPSULE BY MOUTH 3 TIMES DAILY (Patient taking differently: Take 300 mg by mouth at bedtime.) 270 capsule 0  . gabapentin (NEURONTIN) 300 MG capsule Take 1 capsule (300 mg total) by mouth at bedtime. With second dose as needed     No facility-administered medications prior to visit.     Per HPI unless specifically indicated in ROS section below Review of Systems Objective:  BP 130/70 Comment: home reading she brings in from [facility] RN  Pulse (!) 55   Temp 97.8 F (36.6 C) (Temporal)   Ht 5\' 2"  (1.575 m)   Wt 135 lb 2 oz (61.3 kg)   SpO2 96%   BMI 24.71 kg/m   Wt Readings from Last 3 Encounters:  03/22/20 135 lb 2 oz (61.3 kg)  02/21/20 135 lb 1 oz (61.3 kg)  02/01/20 134 lb 8 oz (61 kg)      Physical Exam Vitals and nursing note reviewed.  Constitutional:      Appearance: Normal appearance. She is not ill-appearing.  Cardiovascular:     Rate and Rhythm: Normal rate and regular rhythm.     Pulses: Normal pulses.     Heart sounds: Normal heart sounds. No murmur heard.   Pulmonary:     Effort: Pulmonary effort is normal. No respiratory distress.     Breath sounds: Normal breath sounds. No wheezing, rhonchi or rales.  Musculoskeletal:        General: Swelling (mild pedal edema) present.     Right lower leg: Edema (tr) present.     Left lower leg: Edema (tr) present.  Skin:    General: Skin is warm and dry.     Findings: No rash.  Neurological:     Mental Status: She is alert.  Psychiatric:        Mood and Affect: Mood normal.        Behavior: Behavior normal.       Results for orders placed or performed in visit on 02/21/20  Renal function panel  Result Value Ref Range   Sodium 142 135 - 145 mEq/L   Potassium 3.6 3.5 - 5.1 mEq/L   Chloride 105 96 -  112 mEq/L   CO2 30 19 - 32 mEq/L   Albumin 3.9 3.5 - 5.2 g/dL   BUN 11 6 - 23 mg/dL   Creatinine, Ser 0.85 0.40 - 1.20 mg/dL   Glucose, Bld 91 70 - 99 mg/dL   Phosphorus 3.7 2.3 - 4.6 mg/dL   GFR 61.04 >60.00 mL/min   Calcium 8.8 8.4 - 10.5 mg/dL  TSH  Result Value Ref Range   TSH 3.62 0.35 - 4.50 uIU/mL   Lab Results  Component Value Date   VITAMINB12 277 11/16/2019    Assessment & Plan:  This visit occurred during the SARS-CoV-2 public health emergency.  Safety protocols were in place, including screening questions prior to the visit, additional usage of staff PPE, and extensive cleaning of exam room while observing appropriate contact time as indicated for disinfecting solutions.   Problem List Items Addressed This Visit    Vitamin B12 deficiency    Continue monthly B12 shots, one provided today.       Skin lesion on examination    Poorly healing lesion to R inferior neck - will refer to derm.       Relevant Orders   Ambulatory referral to Dermatology   RLS (restless legs syndrome)    Ongoing despite requip 2.5mg  nightly. Tizanidine helps. Encouraged good hydration. Check iron panel next labs.       Peripheral neuropathy    Has decreased gabapentin dose to 300mg  QHS given ongoing unsteadiness, dizziness. Continues tramadol PRN.       Relevant Medications   gabapentin (NEURONTIN) 300 MG capsule   Palliative care by specialist    Appreciate palliative care input ([facility])      MCI (mild cognitive impairment) with memory loss    Namenda started 01/2020. Continue this. Seems to be tolerating well.       General unsteadiness    HH PT involved with benefit. In process of evaluation for in-home aide through the [place].       Essential hypertension - Primary    Chronic, markedly elevated in office however with normal readings at home - anticipate component of white coat hypertension. Accordingly no changes made today, advised continue monitor at home and let me know  if trending up at home as I would then be more inclined to titrate medications. Continue lisinopril 10mg , metoprolol 12.5mg  bid.  Continue monitoring dizziness.       DNR (do not resuscitate)   Chronic constipation    Reviewed bowel regimen - rec miralax daily, hold for loose stools. May add senna PRN, enema as last resort if no BM in several days. Discussed fiber in diet, encouraged kiwi/prune intake.       Relevant Medications   senna (SENOKOT) 8.6 MG TABS tablet       Meds ordered this encounter  Medications  . cyanocobalamin ((VITAMIN B-12)) injection 1,000 mcg  . senna (SENOKOT) 8.6 MG TABS tablet    Sig: Take 1 tablet (8.6 mg total) by mouth daily as needed for mild constipation.   Orders Placed This Encounter  Procedures  . Ambulatory referral to Dermatology    Referral Priority:   Routine    Referral Type:   Consultation    Referral Reason:   Specialty Services Required    Requested Specialty:   Dermatology    Number of Visits Requested:   1    Patient Instructions  For constipation - increase water and fiber in diet. Kiwi and prunes are good for constipation. May take miralax 1 packet daily, hold if diarrhea develops. May add on senna (senoside) over the counter medication as needed. Then use enema as last resort.  No significant changes today - continue current medicines.  Return in 2-3 months for physical with labs.   Follow up plan: Return in about 3 months (around 06/20/2020), or if symptoms worsen or fail to improve, for medicare wellness visit,  annual exam, prior fasting for blood work.  Ria Bush, MD

## 2020-03-22 NOTE — Patient Instructions (Addendum)
For constipation - increase water and fiber in diet. Kiwi and prunes are good for constipation. May take miralax 1 packet daily, hold if diarrhea develops. May add on senna (senoside) over the counter medication as needed. Then use enema as last resort.  No significant changes today - continue current medicines.  Return in 2-3 months for physical with labs.

## 2020-03-23 ENCOUNTER — Ambulatory Visit: Payer: Medicare Other | Admitting: Physician Assistant

## 2020-03-23 ENCOUNTER — Encounter: Payer: Medicare Other | Attending: Physician Assistant | Admitting: Physician Assistant

## 2020-03-23 DIAGNOSIS — M1990 Unspecified osteoarthritis, unspecified site: Secondary | ICD-10-CM | POA: Diagnosis not present

## 2020-03-23 DIAGNOSIS — L97512 Non-pressure chronic ulcer of other part of right foot with fat layer exposed: Secondary | ICD-10-CM | POA: Insufficient documentation

## 2020-03-23 DIAGNOSIS — I480 Paroxysmal atrial fibrillation: Secondary | ICD-10-CM | POA: Insufficient documentation

## 2020-03-23 DIAGNOSIS — I739 Peripheral vascular disease, unspecified: Secondary | ICD-10-CM | POA: Insufficient documentation

## 2020-03-23 DIAGNOSIS — Z7901 Long term (current) use of anticoagulants: Secondary | ICD-10-CM | POA: Diagnosis not present

## 2020-03-23 MED ORDER — SENNA 8.6 MG PO TABS
1.0000 | ORAL_TABLET | Freq: Every day | ORAL | Status: DC | PRN
Start: 2020-03-23 — End: 2020-10-11

## 2020-03-23 NOTE — Assessment & Plan Note (Signed)
Has decreased gabapentin dose to 300mg  QHS given ongoing unsteadiness, dizziness. Continues tramadol PRN.

## 2020-03-23 NOTE — Assessment & Plan Note (Signed)
Poorly healing lesion to R inferior neck - will refer to derm.

## 2020-03-23 NOTE — Assessment & Plan Note (Signed)
Reviewed bowel regimen - rec miralax daily, hold for loose stools. May add senna PRN, enema as last resort if no BM in several days. Discussed fiber in diet, encouraged kiwi/prune intake.

## 2020-03-23 NOTE — Assessment & Plan Note (Signed)
HH PT involved with benefit. In process of evaluation for in-home aide through the [place].

## 2020-03-23 NOTE — Assessment & Plan Note (Signed)
Ongoing despite requip 2.5mg  nightly. Tizanidine helps. Encouraged good hydration. Check iron panel next labs.

## 2020-03-23 NOTE — Assessment & Plan Note (Signed)
Appreciate palliative care input ([facility])

## 2020-03-23 NOTE — Assessment & Plan Note (Signed)
Namenda started 01/2020. Continue this. Seems to be tolerating well.

## 2020-03-23 NOTE — Progress Notes (Addendum)
CANIYAH, MURLEY ([id number]) Visit Report for 03/23/2020 Chief Complaint Document Details Patient Name: [name], [name]. Date of Service: 03/23/2020 2:15 PM Medical Record Number: [id number] Patient Account Number: [id number] Date of Birth/Sex: Feb 21, 1930 (86 y.o. F) Treating RN: Cornell Barman Primary Care Provider: Ria Bush Other Clinician: Referring Provider: Ria Bush Treating Provider/Extender: Skipper Cliche in Treatment: 23 Information Obtained from: Patient Chief Complaint Right Lateral Foot Ulcer Electronic Signature(s) Signed: 03/23/2020 2:38:55 PM By: Worthy Keeler PA-C Entered By: Worthy Keeler on 03/23/2020 14:38:54 MESSINA, KOSINSKI ([id number]) -------------------------------------------------------------------------------- HPI Details Patient Name: [name] Date of Service: 03/23/2020 2:15 PM Medical Record Number: [id number] Patient Account Number: [id number] Date of Birth/Sex: [birth year] (86 y.o. F) Treating RN: Cornell Barman Primary Care Provider: Ria Bush Other Clinician: Referring Provider: Ria Bush Treating Provider/Extender: Skipper Cliche in Treatment: 23 History of Present Illness HPI Description: 10/12/2019 upon evaluation today patient appears to be doing somewhat poorly in regard to her wound at this point. She has a ulcer over the right fifth metatarsal region. This has been managed by podiatry up to this point. Unfortunately the patient just does not seem to be making the progress that they were hoping for an subsequently the patient was referred to [place] by [facility] Dr. Posey Pronto. With that being said I did note that the patient does have peripheral vascular disease which has been managed by Dr. Lazaro Arms her most recent angiogram with intervention was on 08/02/2019. The ABIs following were on the right 1.30 with a TBI of 0.61 normal left 1.31 with a TBI of 0.58. Obviously things seem to be doing somewhat  better at this point hopefully enough to allow her to heal. She is on anticoagulant therapy due to atrial fibrillation long-term. With that being said the patient is unfortunately having quite a bit of discomfort at this time secondary to the wound. She also has erythema which is very likely secondary to infection. I am going obtain a culture of the area today once debridement is complete. Apparently the patient also had a x-ray that was performed by Dr. Posey Pronto on 09/21/2019 and this showed that the patient had no obvious signs on x-ray of cortical irregularity and no osteomyelitis obviously noted. With that being said as I discussed with the patient's daughter as well this does not indicate that the patient absolutely has no osteomyelitis as x-rays are only at best 50% helpful in identifying this complication. Nonetheless I do believe that we need to have the patient continue to have this further evaluated specifically I am to recommend that we check into an MRI which I think would be ideal to evaluate whether or not there is osteomyelitis present. 7/16; this is a patient with an ulcer over the right fifth metatarsal head. She follows with Dr. Lucky [facility]boy [facility]. She is due to have an MRI of the foot on 7/26. Using Santyl to the wound 11/04/2019 on evaluation today patient appears to be doing okay in regard to her wound at this time. Unfortunately since I last saw her which was actually her second visit here in the clinic she has moved into a new apartment and then subsequently had a fall. She ended up in the hospital she did fracture her left arm and subsequently has also been dealing with a lot of dizziness she has been at [employer]. She tells me that getting in the change the dressing has been somewhat difficult as far as daily dressing  changes are concerned with the Santyl. Nonetheless when they do change it that is what is been used and her daughter-in-law has also been coming in to  help out as well with the dressing changes to try to make sure it gets done daily. Fortunately there is no signs of active infection at this time. No fever chills noted. I do believe the Santyl has been beneficial and is likely still the best way to go at this point for the patient. I did review the patient's MRI which showed some potential for reactive osteitis but did not show any signs of osteomyelitis overtly. That cannot be excluded but again right now it does not appear that is very likely present as well. This is good news. I also did review her culture she has been on the Bactrim she tells me she has taken this and again that was appropriate based on the results of her culture. 11/19/2019 upon evaluation today patient appears to be doing decently well in regard to her wound. Fortunately there is no signs of active infection at this time wound is not significantly larger in fact is roughly about the same at this point. With that being said there is not as much erythema as previously noted I think the infection seems to be under much better control which is great news. Unfortunately she has fallen since I last saw her and broken her left arm she has an appointment with them following the appointment with me. 8/25; patient was worked in acutely today at the request of home health and was concerned about her right heel, generalized erythema in her feet bilaterally. According to her daughter things have been getting worse over the last for 5 days but not precisely from a wound care point of view. Her wound is on the lateral part of the right fifth metatarsal head looks about the same as I remember this. She has developed dryness and flaking of the skin on her plantar feet. As well a very tender area localized over the insertion point of the Achilles tendon. She has restless leg syndrome. Does not specifically offload the area at night. She wears socks at home and does not really give me a pressure on  the right heel issue. I reviewed her vascular status. She underwent her last revascularization on 08/02/2019 by Dr. Lucky Cowboy. This included an angioplasty of the right peroneal artery as well as the right posterior tibial artery. Her last noninvasive arterial study was on 11/23/2019. On the right her ABI was 1.08 TBI of 0.48. On the left ABI at 1.21 with a TBI of 0.54. Family relates that Dr. Lucky Cowboy felt that he had done the best he could and this. We have been using Santyl to the one wound she has on the right lateral fifth metatarsal head. She does not have a new wound today which is the concern that letter coming into the clinic 12/09/2019 on evaluation today patient appears to be doing decently well in regard to her foot ulcer at this point. She does note that with the compression wrap that the home health nurse put on after contacting our clinic that she actually did much better and felt much better. Her heel is also improved. Fortunately there is no signs of active infection at this time. No fevers, chills, nausea, vomiting, or diarrhea. 12/24/2019 upon evaluation today patient appears to be doing somewhat poorly still in regard to her foot. Unfortunately this is just not showing the signs of improvement that I  would like to see it is a little bit cleaner but also appears to be erythematous and I believe there is infection noted at this point. We will get a need to address this in my opinion. I did actually recommend that we switch back to the Iodoflex as well apparently the home health nurse had switch this away that not really what we want to do at this point in my opinion. They were using silver alginate I think most recently. 01/06/2020 upon evaluation today patient actually appears to be doing much better in regard to her wound. We have switched back to the silver alginate after her home health nurse contacted me and honestly I feel like she is doing quite well in that regard. She does have a lot of  drainage and therefore I think that is probably a better option for. With that being said she is tolerating the compression wrap without complication that also seems to be helping. 01/28/2020 on evaluation today patient appears to be doing well with regard to her foot ulcer. I do feel like she is showing signs of improvement which is great news. There is no signs of active infection at this time which is also good news. No fevers, chills, nausea, vomiting, or diarrhea. Unfortunately she did have a fall and sustained an injury to the left upper arm. She has a skin tear here this appears to be doing okay although [name], [name]. ([id number]) the dressing was stuck to this this was an alginate dressing. I really think she probably needs something like Xeroform to try to prevent anything from sticking to this region. Other than that she seems to be doing quite well. 02/11/2020 on evaluation today patient's arm skin tear appears to be completely healed which is great. Unfortunately she is still having issues at this time with her foot although this seems to be showing signs of improvement it still very slow. There is no signs of active infection at this time. 03/06/2020 upon evaluation today patient appears to be doing decently well in regard to her foot ulcer. There is some minimal slough noted on the surface of the wound building up at this point. Otherwise I really feel like she is doing quite nicely with quite a bit of new granulation and epithelial growth. 03/23/2020 upon evaluation today patient appears to be doing well with regards to her wound. She has been tolerating the dressing changes without complication. Fortunately there is no evidence of active infection and overall feel like her foot ulcer is doing excellent at this point. Electronic Signature(s) Signed: 03/23/2020 3:23:47 PM By: Worthy Keeler PA-C Entered By: Worthy Keeler on 03/23/2020 15:23:46 [name], [name]  ([id number]) -------------------------------------------------------------------------------- Physical Exam Details Patient Name: DEETYA, DROUILLARD. Date of Service: 03/23/2020 2:15 PM Medical Record Number: [id number] Patient Account Number: [id number] Date of Birth/Sex: 1930-02-02 (86 y.o. F) Treating RN: Cornell Barman Primary Care Provider: Ria Bush Other Clinician: Referring Provider: Ria Bush Treating Provider/Extender: Skipper Cliche in Treatment: 71 Constitutional Well-nourished and well-hydrated in no acute distress. Respiratory normal breathing without difficulty. Psychiatric this patient is able to make decisions and demonstrates good insight into disease process. Alert and Oriented x 3. pleasant and cooperative. Notes His wound bed showed signs of excellent granulation and epithelization I see no evidence of infection currently and in fact I see a lot of new granulation tissue that seems to be doing excellent I feel like she is making great progress. This has been very slow but  nonetheless I think that her patience has paid off and we are getting in a much better place at this point. Electronic Signature(s) Signed: 03/23/2020 3:24:08 PM By: Worthy Keeler PA-C Entered By: Worthy Keeler on 03/23/2020 15:24:08 [name] ([id number]) -------------------------------------------------------------------------------- Physician Orders Details Patient Name: [name] Date of Service: 03/23/2020 2:15 PM Medical Record Number: [id number] Patient Account Number: [id number] Date of Birth/Sex: 27-Dec-1929 (86 y.o. F) Treating RN: Dolan Amen Primary Care Provider: Ria Bush Other Clinician: Referring Provider: Ria Bush Treating Provider/Extender: Skipper Cliche in Treatment: 23 Verbal / Phone Orders: No Diagnosis Coding ICD-10 Coding Code Description I73.89 Other specified peripheral vascular diseases L97.512 Non-pressure  chronic ulcer of other part of right foot with fat layer exposed S41.102A Unspecified open wound of left upper arm, initial encounter Z79.01 Long term (current) use of anticoagulants I48.0 Paroxysmal atrial fibrillation B35.3 Tinea pedis Wound Cleansing Wound #3 Right Metatarsal head fifth o Clean wound with Normal Saline. Primary Wound Dressing Wound #3 Right Metatarsal head fifth o Silver Alginate Secondary Dressing Wound #3 Right Metatarsal head fifth o Boardered Foam Dressing Dressing Change Frequency Wound #3 Right Metatarsal head fifth o Other: - dressing to be changed twice a week Follow-up Appointments Wound #3 Right Metatarsal head fifth o Return Appointment in 3 weeks. Additional Orders / Instructions Wound #3 Right Metatarsal head fifth o Increase protein intake. [place] Visits - [facility] o Please direct any NON-WOUND related issues/requests for orders to patient's Primary Care Physician Electronic Signature(s) Signed: 03/23/2020 4:55:27 PM By: Worthy Keeler PA-C Signed: 03/23/2020 5:08:10 PM By: Georges Mouse, Minus Breeding RN Entered By: Georges Mouse, Minus Breeding on 03/23/2020 15:03:10 [name] ([id number]) -------------------------------------------------------------------------------- Problem List Details Patient Name: [name], [name]. Date of Service: 03/23/2020 2:15 PM Medical Record Number: [id number] Patient Account Number: [id number] Date of Birth/Sex: 11-16-1929 (86 y.o. F) Treating RN: Cornell Barman Primary Care Provider: Ria Bush Other Clinician: Referring Provider: Ria Bush Treating Provider/Extender: Skipper Cliche in Treatment: 23 Active Problems ICD-10 Encounter Code Description Active Date MDM Diagnosis I73.89 Other specified peripheral vascular diseases 10/12/2019 No Yes L97.512 Non-pressure chronic ulcer of other part of right foot with fat layer 10/12/2019 No Yes exposed S41.102A  Unspecified open wound of left upper arm, initial encounter 01/28/2020 No Yes Z79.01 Long term (current) use of anticoagulants 10/12/2019 No Yes I48.0 Paroxysmal atrial fibrillation 10/12/2019 No Yes B35.3 Tinea pedis 12/01/2019 No Yes Inactive Problems Resolved Problems Electronic Signature(s) Signed: 03/23/2020 2:38:31 PM By: Worthy Keeler PA-C Entered By: Worthy Keeler on 03/23/2020 14:38:30 [name] ([id number]) -------------------------------------------------------------------------------- Progress Note Details Patient Name: [name]. Date of Service: 03/23/2020 2:15 PM Medical Record Number: [id number] Patient Account Number: [id number] Date of Birth/Sex: September 09, 1929 (86 y.o. F) Treating RN: Cornell Barman Primary Care Provider: Ria Bush Other Clinician: Referring Provider: Ria Bush Treating Provider/Extender: Skipper Cliche in Treatment: 23 Subjective Chief Complaint Information obtained from Patient Right Lateral Foot Ulcer History of Present Illness (HPI) 10/12/2019 upon evaluation today patient appears to be doing somewhat poorly in regard to her wound at this point. She has a ulcer over the right fifth metatarsal region. This has been managed by podiatry up to this point. Unfortunately the patient just does not seem to be making the progress that they were hoping for an subsequently the patient was referred to [place] by [facility] Dr. Posey Pronto. With that being said I did note that the patient does have peripheral  vascular disease which has been managed by Dr. Lazaro Arms her most recent angiogram with intervention was on 08/02/2019. The ABIs following were on the right 1.30 with a TBI of 0.61 normal left 1.31 with a TBI of 0.58. Obviously things seem to be doing somewhat better at this point hopefully enough to allow her to heal. She is on anticoagulant therapy due to atrial fibrillation long-term. With that being said the patient is  unfortunately having quite a bit of discomfort at this time secondary to the wound. She also has erythema which is very likely secondary to infection. I am going obtain a culture of the area today once debridement is complete. Apparently the patient also had a x-ray that was performed by Dr. Posey Pronto on 09/21/2019 and this showed that the patient had no obvious signs on x-ray of cortical irregularity and no osteomyelitis obviously noted. With that being said as I discussed with the patient's daughter as well this does not indicate that the patient absolutely has no osteomyelitis as x-rays are only at best 50% helpful in identifying this complication. Nonetheless I do believe that we need to have the patient continue to have this further evaluated specifically I am to recommend that we check into an MRI which I think would be ideal to evaluate whether or not there is osteomyelitis present. 7/16; this is a patient with an ulcer over the right fifth metatarsal head. She follows with Dr. [facility]boy [facility]. She is due to have an MRI of the foot on 7/26. Using Santyl to the wound 11/04/2019 on evaluation today patient appears to be doing okay in regard to her wound at this time. Unfortunately since I last saw her which was actually her second visit here in the clinic she has moved into a new apartment and then subsequently had a fall. She ended up in the hospital she did fracture her left arm and subsequently has also been dealing with a lot of dizziness she has been at [employer]. She tells me that getting in the change the dressing has been somewhat difficult as far as daily dressing changes are concerned with the Santyl. Nonetheless when they do change it that is what is been used and her daughter-in-law has also been coming in to help out as well with the dressing changes to try to make sure it gets done daily. Fortunately there is no signs of active infection at this time. No fever chills  noted. I do believe the Santyl has been beneficial and is likely still the best way to go at this point for the patient. I did review the patient's MRI which showed some potential for reactive osteitis but did not show any signs of osteomyelitis overtly. That cannot be excluded but again right now it does not appear that is very likely present as well. This is good news. I also did review her culture she has been on the Bactrim she tells me she has taken this and again that was appropriate based on the results of her culture. 11/19/2019 upon evaluation today patient appears to be doing decently well in regard to her wound. Fortunately there is no signs of active infection at this time wound is not significantly larger in fact is roughly about the same at this point. With that being said there is not as much erythema as previously noted I think the infection seems to be under much better control which is great news. Unfortunately she has fallen since I last saw  her and broken her left arm she has an appointment with them following the appointment with me. 8/25; patient was worked in acutely today at the request of home health and was concerned about her right heel, generalized erythema in her feet bilaterally. According to her daughter things have been getting worse over the last for 5 days but not precisely from a wound care point of view. Her wound is on the lateral part of the right fifth metatarsal head looks about the same as I remember this. She has developed dryness and flaking of the skin on her plantar feet. As well a very tender area localized over the insertion point of the Achilles tendon. She has restless leg syndrome. Does not specifically offload the area at night. She wears socks at home and does not really give me a pressure on the right heel issue. I reviewed her vascular status. She underwent her last revascularization on 08/02/2019 by Dr. Lucky Cowboy. This included an angioplasty of the  right peroneal artery as well as the right posterior tibial artery. Her last noninvasive arterial study was on 11/23/2019. On the right her ABI was 1.08 TBI of 0.48. On the left ABI at 1.21 with a TBI of 0.54. Family relates that Dr. Lucky Cowboy felt that he had done the best he could and this. We have been using Santyl to the one wound she has on the right lateral fifth metatarsal head. She does not have a new wound today which is the concern that letter coming into the clinic 12/09/2019 on evaluation today patient appears to be doing decently well in regard to her foot ulcer at this point. She does note that with the compression wrap that the home health nurse put on after contacting our clinic that she actually did much better and felt much better. Her heel is also improved. Fortunately there is no signs of active infection at this time. No fevers, chills, nausea, vomiting, or diarrhea. 12/24/2019 upon evaluation today patient appears to be doing somewhat poorly still in regard to her foot. Unfortunately this is just not showing the signs of improvement that I would like to see it is a little bit cleaner but also appears to be erythematous and I believe there is infection noted at this point. We will get a need to address this in my opinion. I did actually recommend that we switch back to the Iodoflex as well apparently the home health nurse had switch this away that not really what we want to do at this point in my opinion. They were using silver alginate I think most recently. 01/06/2020 upon evaluation today patient actually appears to be doing much better in regard to her wound. We have switched back to the silver alginate after her home health nurse contacted me and honestly I feel like she is doing quite well in that regard. She does have a lot of drainage and therefore I think that is probably a better option for. With that being said she is tolerating the compression wrap without complication  that [name], [name]. ([id number]) also seems to be helping. 01/28/2020 on evaluation today patient appears to be doing well with regard to her foot ulcer. I do feel like she is showing signs of improvement which is great news. There is no signs of active infection at this time which is also good news. No fevers, chills, nausea, vomiting, or diarrhea. Unfortunately she did have a fall and sustained an injury to the left upper arm. She has  a skin tear here this appears to be doing okay although the dressing was stuck to this this was an alginate dressing. I really think she probably needs something like Xeroform to try to prevent anything from sticking to this region. Other than that she seems to be doing quite well. 02/11/2020 on evaluation today patient's arm skin tear appears to be completely healed which is great. Unfortunately she is still having issues at this time with her foot although this seems to be showing signs of improvement it still very slow. There is no signs of active infection at this time. 03/06/2020 upon evaluation today patient appears to be doing decently well in regard to her foot ulcer. There is some minimal slough noted on the surface of the wound building up at this point. Otherwise I really feel like she is doing quite nicely with quite a bit of new granulation and epithelial growth. 03/23/2020 upon evaluation today patient appears to be doing well with regards to her wound. She has been tolerating the dressing changes without complication. Fortunately there is no evidence of active infection and overall feel like her foot ulcer is doing excellent at this point. Objective Constitutional Well-nourished and well-hydrated in no acute distress. Vitals Time Taken: 2:27 PM, Height: 62 in, Weight: 140 lbs, BMI: 25.6, Temperature: 98.0 F, Pulse: 51 bpm, Respiratory Rate: 16 breaths/min, Blood Pressure: 176/82 mmHg. Respiratory normal breathing without  difficulty. Psychiatric this patient is able to make decisions and demonstrates good insight into disease process. Alert and Oriented x 3. pleasant and cooperative. General Notes: His wound bed showed signs of excellent granulation and epithelization I see no evidence of infection currently and in fact I see a lot of new granulation tissue that seems to be doing excellent I feel like she is making great progress. This has been very slow but nonetheless I think that her patience has paid off and we are getting in a much better place at this point. Integumentary (Hair, Skin) Wound #3 status is Open. Original cause of wound was Gradually Appeared. The wound is located on the Right Metatarsal head fifth. The wound measures 0.9cm length x 0.5cm width x 0.3cm depth; 0.353cm^2 area and 0.106cm^3 volume. There is Fat Layer (Subcutaneous Tissue) exposed. There is no tunneling or undermining noted. There is a medium amount of serosanguineous drainage noted. The wound margin is distinct with the outline attached to the wound base. There is large (67-100%) red, pink, hyper - granulation within the wound bed. There is a small (1-33%) amount of necrotic tissue within the wound bed including Adherent Slough. Assessment Active Problems ICD-10 Other specified peripheral vascular diseases Non-pressure chronic ulcer of other part of right foot with fat layer exposed Unspecified open wound of left upper arm, initial encounter Long term (current) use of anticoagulants Paroxysmal atrial fibrillation Tinea pedis Plan [name], [name]. ([id number]) Wound Cleansing: Wound #3 Right Metatarsal head fifth: Clean wound with Normal Saline. Primary Wound Dressing: Wound #3 Right Metatarsal head fifth: Silver Alginate Secondary Dressing: Wound #3 Right Metatarsal head fifth: Boardered Foam Dressing Dressing Change Frequency: Wound #3 Right Metatarsal head fifth: Other: - dressing to be changed twice a  week Follow-up Appointments: Wound #3 Right Metatarsal head fifth: Return Appointment in 3 weeks. Additional Orders / Instructions: Wound #3 Right Metatarsal head fifth: Increase protein intake. Home Health: [place] Visits - [facility] Please direct any NON-WOUND related issues/requests for orders to patient's Primary Care Physician 1. Would recommend currently that we have the patient  go ahead and continue with the wound care measures as before specifically with regard to the silver alginate dressing which I think is going to do well for her. She has made excellent progress with this up to this point and I feel like there is no reason to make any changes currently. 2. Would also recommend we continue with a bordered foam dressing I feel like that still doing great for her as well. 3. Muscle recommend a follow-up in 3 weeks we will see where things stand at that point hopefully she will be doing even better. We will see patient back for reevaluation in 3 weeks here in the clinic. If anything worsens or changes patient will contact our office for additional recommendations. Electronic Signature(s) Signed: 03/23/2020 3:24:52 PM By: Worthy Keeler PA-C Entered By: Worthy Keeler on 03/23/2020 15:24:52 [name], [name] ([id number]) -------------------------------------------------------------------------------- SuperBill Details Patient Name: [name] Date of Service: 03/23/2020 Medical Record Number: [id number] Patient Account Number: [id number] Date of Birth/Sex: [birth year] (86 y.o. F) Treating RN: Cornell Barman Primary Care Provider: Ria Bush Other Clinician: Referring Provider: Ria Bush Treating Provider/Extender: Skipper Cliche in Treatment: 23 Diagnosis Coding ICD-10 Codes Code Description I73.89 Other specified peripheral vascular diseases L97.512 Non-pressure chronic ulcer of other part of right foot with fat layer exposed S41.102A  Unspecified open wound of left upper arm, initial encounter Z79.01 Long term (current) use of anticoagulants I48.0 Paroxysmal atrial fibrillation B35.3 Tinea pedis Facility Procedures CPT4 Code: 83151761 Description: 99213 - WOUND CARE VISIT-LEV 3 EST PT Modifier: Quantity: 1 Physician Procedures CPT4 Code: 6073710 Description: 62694 - WC PHYS LEVEL 3 - EST PT Modifier: Quantity: 1 CPT4 Code: Description: ICD-10 Diagnosis Description I73.89 Other specified peripheral vascular diseases L97.512 Non-pressure chronic ulcer of other part of right foot with fat layer e S41.102A Unspecified open wound of left upper arm, initial encounter Z79.01 Long  term (current) use of anticoagulants Modifier: xposed Quantity: Electronic Signature(s) Signed: 03/23/2020 3:25:14 PM By: Worthy Keeler PA-C Entered By: Worthy Keeler on 03/23/2020 15:25:13

## 2020-03-23 NOTE — Assessment & Plan Note (Addendum)
Chronic, markedly elevated in office however with normal readings at home - anticipate component of white coat hypertension. Accordingly no changes made today, advised continue monitor at home and let me know if trending up at home as I would then be more inclined to titrate medications. Continue lisinopril 10mg , metoprolol 12.5mg  bid.  Continue monitoring dizziness.

## 2020-03-23 NOTE — Assessment & Plan Note (Signed)
Continue monthly B12 shots, one provided today.

## 2020-03-23 NOTE — Assessment & Plan Note (Deleted)
Continues aspirin and low dose eliquis.

## 2020-03-24 ENCOUNTER — Other Ambulatory Visit: Payer: Self-pay | Admitting: Family Medicine

## 2020-03-24 NOTE — Telephone Encounter (Addendum)
Message from pt via pharmacy requesting new rx since her dose has increased to 2.5 tabs daily.   Last OV:  03/22/20, 4 wk f/u Next OV:  06/21/20, AWV prt 2

## 2020-03-24 NOTE — Progress Notes (Signed)
ADDIS, BENNIE ([id number]) Visit Report for 03/23/2020 Arrival Information Details Patient Name: [name], [name]. Date of Service: 03/23/2020 2:15 PM Medical Record Number: [id number] Patient Account Number: [id number] Date of Birth/Sex: 12/02/1929 (86 y.o. F) Treating RN: Dolan Amen Primary Care Falicity Sheets: Ria Bush Other Clinician: Referring Shadia Larose: Ria Bush Treating Hortensia Duffin/Extender: Skipper Cliche in Treatment: 23 Visit Information History Since Last Visit Pain Present Now: Yes Patient Arrived: Walker Arrival Time: 14:25 Accompanied By: daughter Transfer Assistance: None Patient Requires Transmission-Based No Precautions: Patient Has Alerts: Yes Patient Alerts: 08/31/19 ABI R)1.3 L) 1.31 08/31/19 TBI R).61 L).58 Electronic Signature(s) Signed: 03/23/2020 5:08:10 PM By: Georges Mouse, Minus Breeding RN Entered By: Georges Mouse, Minus Breeding on 03/23/2020 14:27:06 [name] ([id number]) -------------------------------------------------------------------------------- Clinic Level of Care Assessment Details Patient Name: [name] Date of Service: 03/23/2020 2:15 PM Medical Record Number: [id number] Patient Account Number: [id number] Date of Birth/Sex: 06/17/1929 (86 y.o. F) Treating RN: Dolan Amen Primary Care Sulma Ruffino: Ria Bush Other Clinician: Referring Dameka Younker: Ria Bush Treating Marvis Bakken/Extender: Skipper Cliche in Treatment: 23 Clinic Level of Care Assessment Items TOOL 4 Quantity Score X - Use when only an EandM is performed on FOLLOW-UP visit 1 0 ASSESSMENTS - Nursing Assessment / Reassessment X - Reassessment of Co-morbidities (includes updates in patient status) 1 10 X- 1 5 Reassessment of Adherence to Treatment Plan ASSESSMENTS - Wound and Skin Assessment / Reassessment X - Simple Wound Assessment / Reassessment - one wound 1 5 []  - 0 Complex Wound Assessment / Reassessment - multiple wounds []  -  0 Dermatologic / Skin Assessment (not related to wound area) ASSESSMENTS - Focused Assessment []  - Circumferential Edema Measurements - multi extremities 0 []  - 0 Nutritional Assessment / Counseling / Intervention []  - 0 Lower Extremity Assessment (monofilament, tuning fork, pulses) []  - 0 Peripheral Arterial Disease Assessment (using hand held doppler) ASSESSMENTS - Ostomy and/or Continence Assessment and Care []  - Incontinence Assessment and Management 0 []  - 0 Ostomy Care Assessment and Management (repouching, etc.) PROCESS - Coordination of Care X - Simple Patient / Family Education for ongoing care 1 15 []  - 0 Complex (extensive) Patient / Family Education for ongoing care []  - 0 Staff obtains Programmer, systems, Records, Test Results / Process Orders []  - 0 Staff telephones HHA, Nursing Homes / Clarify orders / etc []  - 0 Routine Transfer to another Facility (non-emergent condition) []  - 0 Routine Hospital Admission (non-emergent condition) []  - 0 New Admissions / Biomedical engineer / Ordering NPWT, Apligraf, etc. []  - 0 Emergency Hospital Admission (emergent condition) X- 1 10 Simple Discharge Coordination []  - 0 Complex (extensive) Discharge Coordination PROCESS - Special Needs []  - Pediatric / Minor Patient Management 0 []  - 0 Isolation Patient Management []  - 0 Hearing / Language / Visual special needs []  - 0 Assessment of Community assistance (transportation, D/C planning, etc.) []  - 0 Additional assistance / Altered mentation []  - 0 Support Surface(s) Assessment (bed, cushion, seat, etc.) INTERVENTIONS - Wound Cleansing / Measurement SHALEA, TOMCZAK. ([id number]) X- 1 5 Simple Wound Cleansing - one wound []  - 0 Complex Wound Cleansing - multiple wounds X- 1 5 Wound Imaging (photographs - any number of wounds) []  - 0 Wound Tracing (instead of photographs) X- 1 5 Simple Wound Measurement - one wound []  - 0 Complex Wound Measurement - multiple  wounds INTERVENTIONS - Wound Dressings []  - Small Wound Dressing one or multiple wounds 0 X- 1 15 Medium Wound Dressing one or multiple wounds []  - 0  Large Wound Dressing one or multiple wounds []  - 0 Application of Medications - topical []  - 0 Application of Medications - injection INTERVENTIONS - Miscellaneous []  - External ear exam 0 []  - 0 Specimen Collection (cultures, biopsies, blood, body fluids, etc.) []  - 0 Specimen(s) / Culture(s) sent or taken to Lab for analysis []  - 0 Patient Transfer (multiple staff / Civil Service fast streamer / Similar devices) []  - 0 Simple Staple / Suture removal (25 or less) []  - 0 Complex Staple / Suture removal (26 or more) []  - 0 Hypo / Hyperglycemic Management (close monitor of Blood Glucose) []  - 0 Ankle / Brachial Index (ABI) - do not check if billed separately X- 1 5 Vital Signs Has the patient been seen at the hospital within the last three years: Yes Total Score: 80 Level Of Care: New/Established - Level 3 Electronic Signature(s) Signed: 03/23/2020 5:08:10 PM By: Georges Mouse, Minus Breeding RN Entered By: Georges Mouse, Minus Breeding on 03/23/2020 15:05:00 [name] ([id number]) -------------------------------------------------------------------------------- Encounter Discharge Information Details Patient Name: [name]. Date of Service: 03/23/2020 2:15 PM Medical Record Number: [id number] Patient Account Number: [id number] Date of Birth/Sex: 1929-11-24 (86 y.o. F) Treating RN: Dolan Amen Primary Care Atalia Litzinger: Ria Bush Other Clinician: Referring Jermale Crass: Ria Bush Treating Linnie Delgrande/Extender: Skipper Cliche in Treatment: 23 Encounter Discharge Information Items Discharge Condition: Stable Ambulatory Status: Walker Discharge Destination: Home Transportation: Private Auto Accompanied By: daughter Schedule Follow-up Appointment: Yes Clinical Summary of Care: Electronic Signature(s) Signed: 03/23/2020 5:08:10  PM By: Georges Mouse, Minus Breeding RN Entered By: Georges Mouse, Minus Breeding on 03/23/2020 15:07:09 [name] ([id number]) -------------------------------------------------------------------------------- Lower Extremity Assessment Details Patient Name: [name] Date of Service: 03/23/2020 2:15 PM Medical Record Number: [id number] Patient Account Number: [id number] Date of Birth/Sex: 07-Sep-1929 (86 y.o. F) Treating RN: Dolan Amen Primary Care Adelise Buswell: Ria Bush Other Clinician: Referring Jobe Mutch: Ria Bush Treating Savana Spina/Extender: Jeri Cos Weeks in Treatment: 23 Edema Assessment Assessed: [Left: No] [Right: Yes] Edema: [Left: N] [Right: o] Electronic Signature(s) Signed: 03/23/2020 5:08:10 PM By: Georges Mouse, Minus Breeding RN Entered By: Georges Mouse, Kenia on 03/23/2020 14:51:22 JACQUIE, LUKES ([id number]) -------------------------------------------------------------------------------- Multi Wound Chart Details Patient Name: [name] Date of Service: 03/23/2020 2:15 PM Medical Record Number: [id number] Patient Account Number: [id number] Date of Birth/Sex: Aug 20, 1929 (86 y.o. F) Treating RN: Dolan Amen Primary Care Demichael Traum: Ria Bush Other Clinician: Referring Labrandon Knoch: Ria Bush Treating Kimala Horne/Extender: Skipper Cliche in Treatment: 23 Vital Signs Height(in): 56 Pulse(bpm): 50 Weight(lbs): 140 Blood Pressure(mmHg): 176/82 Body Mass Index(BMI): 26 Temperature(F): 98.0 Respiratory Rate(breaths/min): 16 Photos: [N/A:N/A] Wound Location: Right Metatarsal head fifth N/A N/A Wounding Event: Gradually Appeared N/A N/A Primary Etiology: Arterial Insufficiency Ulcer N/A N/A Comorbid History: Arrhythmia, Osteoarthritis N/A N/A Date Acquired: 04/09/2019 N/A N/A Weeks of Treatment: 23 N/A N/A Wound Status: Open N/A N/A Measurements L x W x D (cm) 0.9x0.5x0.3 N/A N/A Area (cm) : 0.353 N/A N/A Volume (cm) : 0.106  N/A N/A % Reduction in Area: 68.80% N/A N/A % Reduction in Volume: 6.20% N/A N/A Classification: Full Thickness Without Exposed N/A N/A Support Structures Exudate Amount: Medium N/A N/A Exudate Type: Serosanguineous N/A N/A Exudate Color: red, brown N/A N/A Wound Margin: Distinct, outline attached N/A N/A Granulation Amount: Large (67-100%) N/A N/A Granulation Quality: Red, Pink, Hyper-granulation N/A N/A Necrotic Amount: Small (1-33%) N/A N/A Exposed Structures: Fat Layer (Subcutaneous Tissue): N/A N/A Yes Fascia: No Tendon: No Muscle: No Joint: No Bone: No Epithelialization: None N/A N/A Treatment Notes Electronic Signature(s) Signed: 03/23/2020  5:08:10 PM By: Georges Mouse, Minus Breeding RN Entered By: Georges Mouse, Minus Breeding on 03/23/2020 14:57:49 [name] ([id number]) -------------------------------------------------------------------------------- Gary Details Patient Name: TEMEKA, PORE. Date of Service: 03/23/2020 2:15 PM Medical Record Number: [id number] Patient Account Number: [id number] Date of Birth/Sex: [birth year] (86 y.o. F) Treating RN: Dolan Amen Primary Care Pacer Dorn: Ria Bush Other Clinician: Referring Brianni Manthe: Ria Bush Treating Yoshiaki Kreuser/Extender: Skipper Cliche in Treatment: 23 Active Inactive Abuse / Safety / Falls / Self Care Management Nursing Diagnoses: Potential for falls Goals: Patient/caregiver will verbalize understanding of skin care regimen Date Initiated: 10/12/2019 Target Resolution Date: 03/10/2020 Goal Status: Active Interventions: Assess fall risk on admission and as needed Notes: Necrotic Tissue Nursing Diagnoses: Impaired tissue integrity related to necrotic/devitalized tissue Goals: Necrotic/devitalized tissue will be minimized in the wound bed Date Initiated: 10/12/2019 Target Resolution Date: 03/10/2020 Goal Status: Active Interventions: Assess patient pain level pre-, during  and post procedure and prior to discharge Treatment Activities: Enzymatic debridement : 10/12/2019 Notes: Pain, Acute or Chronic Nursing Diagnoses: Potential alteration in comfort, pain Goals: Patient will verbalize adequate pain control and receive pain control interventions during procedures as needed Date Initiated: 10/12/2019 Target Resolution Date: 03/10/2020 Goal Status: Active Interventions: Complete pain assessment as per visit requirements Treatment Activities: Administer pain control measures as ordered : 10/12/2019 Notes: Peripheral Neuropathy Nursing Diagnoses: Knowledge deficit related to disease process and management of peripheral neurovascular dysfunction SHIZUYE, RUPERT. ([id number]) Potential alteration in peripheral tissue perfusion (select prior to confirmation of diagnosis) Goals: Patient/caregiver will verbalize understanding of disease process and disease management Date Initiated: 10/12/2019 Target Resolution Date: 03/10/2020 Goal Status: Active Interventions: Assess signs and symptoms of neuropathy upon admission and as needed Provide education on Management of Neuropathy and Related Ulcers Notes: Electronic Signature(s) Signed: 03/23/2020 5:08:10 PM By: Georges Mouse, Minus Breeding RN Entered By: Georges Mouse, Minus Breeding on 03/23/2020 14:56:24 [name] ([id number]) -------------------------------------------------------------------------------- Pain Assessment Details Patient Name: [name]. Date of Service: 03/23/2020 2:15 PM Medical Record Number: [id number] Patient Account Number: [id number] Date of Birth/Sex: [birth year] (86 y.o. F) Treating RN: Dolan Amen Primary Care Mehmet Scally: Ria Bush Other Clinician: Referring Annett Boxwell: Ria Bush Treating Tamesha Ellerbrock/Extender: Skipper Cliche in Treatment: 23 Active Problems Location of Pain Severity and Description of Pain Patient Has Paino Yes Site Locations Pain  Location: Generalized Pain Rate the pain. Current Pain Level: 6 Pain Management and Medication Current Pain Management: Electronic Signature(s) Signed: 03/23/2020 5:08:10 PM By: Georges Mouse, Minus Breeding RN Entered By: Georges Mouse, Kenia on 03/23/2020 14:34:37 [name] ([id number]) -------------------------------------------------------------------------------- Patient/Caregiver Education Details Patient Name: [name] Date of Service: 03/23/2020 2:15 PM Medical Record Number: [id number] Patient Account Number: [id number] Date of Birth/Gender: 28-Jul-1929 (86 y.o. F) Treating RN: Dolan Amen Primary Care Physician: Ria Bush Other Clinician: Referring Physician: Ria Bush Treating Physician/Extender: Skipper Cliche in Treatment: 23 Education Assessment Education Provided To: Patient Education Topics Provided Wound/Skin Impairment: Methods: Explain/Verbal Responses: State content correctly Electronic Signature(s) Signed: 03/23/2020 5:08:10 PM By: Georges Mouse, Minus Breeding RN Entered By: Georges Mouse, Minus Breeding on 03/23/2020 15:05:31 [name] ([id number]) -------------------------------------------------------------------------------- Wound Assessment Details Patient Name: [name] Date of Service: 03/23/2020 2:15 PM Medical Record Number: [id number] Patient Account Number: [id number] Date of Birth/Sex: [birth year] (86 y.o. F) Treating RN: Dolan Amen Primary Care Jaella Weinert: Ria Bush Other Clinician: Referring Kristian Hazzard: Ria Bush Treating Trenna Kiely/Extender: Jeri Cos Weeks in Treatment: 23 Wound Status Wound Number: 3 Primary Etiology: Arterial Insufficiency Ulcer Wound Location: Right Metatarsal head fifth Wound Status:  Open Wounding Event: Gradually Appeared Comorbid History: Arrhythmia, Osteoarthritis Date Acquired: 04/09/2019 Weeks Of Treatment: 23 Clustered Wound: No Photos Wound  Measurements Length: (cm) 0.9 Width: (cm) 0.5 Depth: (cm) 0.3 Area: (cm) 0.353 Volume: (cm) 0.106 % Reduction in Area: 68.8% % Reduction in Volume: 6.2% Epithelialization: None Tunneling: No Undermining: No Wound Description Classification: Full Thickness Without Exposed Support Structu Wound Margin: Distinct, outline attached Exudate Amount: Medium Exudate Type: Serosanguineous Exudate Color: red, brown res Foul Odor After Cleansing: No Slough/Fibrino Yes Wound Bed Granulation Amount: Large (67-100%) Exposed Structure Granulation Quality: Red, Pink, Hyper-granulation Fascia Exposed: No Necrotic Amount: Small (1-33%) Fat Layer (Subcutaneous Tissue) Exposed: Yes Necrotic Quality: Adherent Slough Tendon Exposed: No Muscle Exposed: No Joint Exposed: No Bone Exposed: No Treatment Notes Wound #3 (Right Metatarsal head fifth) Notes scell, bordered foam dressing Electronic Signature(s) Signed: 03/23/2020 5:08:10 PM By: Georges Mouse, Minus Breeding RN [place], Nalanie Chauncey Cruel ([id number]) Entered By: Georges Mouse, Minus Breeding on 03/23/2020 14:43:27 [name] ([id number]) -------------------------------------------------------------------------------- Vitals Details Patient Name: [name]. Date of Service: 03/23/2020 2:15 PM Medical Record Number: [id number] Patient Account Number: [id number] Date of Birth/Sex: August 03, 1929 (86 y.o. F) Treating RN: Dolan Amen Primary Care Mael Delap: Ria Bush Other Clinician: Referring Marisol Glazer: Ria Bush Treating Delaila Nand/Extender: Skipper Cliche in Treatment: 23 Vital Signs Time Taken: 14:27 Temperature (F): 98.0 Height (in): 62 Pulse (bpm): 51 Weight (lbs): 140 Respiratory Rate (breaths/min): 16 Body Mass Index (BMI): 25.6 Blood Pressure (mmHg): 176/82 Reference Range: 80 - 120 mg / dl Electronic Signature(s) Signed: 03/23/2020 5:08:10 PM By: Georges Mouse, Minus Breeding RN Entered By: Georges Mouse, Minus Breeding on  03/23/2020 14:33:55

## 2020-03-27 DIAGNOSIS — Z9181 History of falling: Secondary | ICD-10-CM | POA: Diagnosis not present

## 2020-03-27 DIAGNOSIS — I130 Hypertensive heart and chronic kidney disease with heart failure and stage 1 through stage 4 chronic kidney disease, or unspecified chronic kidney disease: Secondary | ICD-10-CM | POA: Diagnosis not present

## 2020-03-27 DIAGNOSIS — G9009 Other idiopathic peripheral autonomic neuropathy: Secondary | ICD-10-CM | POA: Diagnosis not present

## 2020-03-27 DIAGNOSIS — E538 Deficiency of other specified B group vitamins: Secondary | ICD-10-CM | POA: Diagnosis not present

## 2020-03-27 DIAGNOSIS — I272 Pulmonary hypertension, unspecified: Secondary | ICD-10-CM | POA: Diagnosis not present

## 2020-03-27 DIAGNOSIS — I872 Venous insufficiency (chronic) (peripheral): Secondary | ICD-10-CM | POA: Diagnosis not present

## 2020-03-27 DIAGNOSIS — L97513 Non-pressure chronic ulcer of other part of right foot with necrosis of muscle: Secondary | ICD-10-CM | POA: Diagnosis not present

## 2020-03-27 DIAGNOSIS — K219 Gastro-esophageal reflux disease without esophagitis: Secondary | ICD-10-CM | POA: Diagnosis not present

## 2020-03-27 DIAGNOSIS — E7849 Other hyperlipidemia: Secondary | ICD-10-CM | POA: Diagnosis not present

## 2020-03-27 DIAGNOSIS — I739 Peripheral vascular disease, unspecified: Secondary | ICD-10-CM | POA: Diagnosis not present

## 2020-03-27 DIAGNOSIS — M1990 Unspecified osteoarthritis, unspecified site: Secondary | ICD-10-CM | POA: Diagnosis not present

## 2020-03-27 DIAGNOSIS — G2581 Restless legs syndrome: Secondary | ICD-10-CM | POA: Diagnosis not present

## 2020-03-27 DIAGNOSIS — M5136 Other intervertebral disc degeneration, lumbar region: Secondary | ICD-10-CM | POA: Diagnosis not present

## 2020-03-27 DIAGNOSIS — Z792 Long term (current) use of antibiotics: Secondary | ICD-10-CM | POA: Diagnosis not present

## 2020-03-27 DIAGNOSIS — N1831 Chronic kidney disease, stage 3a: Secondary | ICD-10-CM | POA: Diagnosis not present

## 2020-03-27 DIAGNOSIS — M810 Age-related osteoporosis without current pathological fracture: Secondary | ICD-10-CM | POA: Diagnosis not present

## 2020-03-27 DIAGNOSIS — Z7901 Long term (current) use of anticoagulants: Secondary | ICD-10-CM | POA: Diagnosis not present

## 2020-03-27 DIAGNOSIS — E559 Vitamin D deficiency, unspecified: Secondary | ICD-10-CM | POA: Diagnosis not present

## 2020-03-27 DIAGNOSIS — S62102D Fracture of unspecified carpal bone, left wrist, subsequent encounter for fracture with routine healing: Secondary | ICD-10-CM | POA: Diagnosis not present

## 2020-03-27 DIAGNOSIS — I10 Essential (primary) hypertension: Secondary | ICD-10-CM | POA: Diagnosis not present

## 2020-03-27 DIAGNOSIS — Z7982 Long term (current) use of aspirin: Secondary | ICD-10-CM | POA: Diagnosis not present

## 2020-03-27 DIAGNOSIS — I2510 Atherosclerotic heart disease of native coronary artery without angina pectoris: Secondary | ICD-10-CM | POA: Diagnosis not present

## 2020-03-27 DIAGNOSIS — I5032 Chronic diastolic (congestive) heart failure: Secondary | ICD-10-CM | POA: Diagnosis not present

## 2020-03-27 DIAGNOSIS — I4891 Unspecified atrial fibrillation: Secondary | ICD-10-CM | POA: Diagnosis not present

## 2020-03-28 ENCOUNTER — Ambulatory Visit: Payer: Medicare Other | Admitting: Podiatry

## 2020-03-29 DIAGNOSIS — Z7982 Long term (current) use of aspirin: Secondary | ICD-10-CM | POA: Diagnosis not present

## 2020-03-29 DIAGNOSIS — L97513 Non-pressure chronic ulcer of other part of right foot with necrosis of muscle: Secondary | ICD-10-CM | POA: Diagnosis not present

## 2020-03-29 DIAGNOSIS — I739 Peripheral vascular disease, unspecified: Secondary | ICD-10-CM | POA: Diagnosis not present

## 2020-03-29 DIAGNOSIS — Z9181 History of falling: Secondary | ICD-10-CM | POA: Diagnosis not present

## 2020-03-29 DIAGNOSIS — I130 Hypertensive heart and chronic kidney disease with heart failure and stage 1 through stage 4 chronic kidney disease, or unspecified chronic kidney disease: Secondary | ICD-10-CM | POA: Diagnosis not present

## 2020-03-29 DIAGNOSIS — I10 Essential (primary) hypertension: Secondary | ICD-10-CM | POA: Diagnosis not present

## 2020-03-29 DIAGNOSIS — G2581 Restless legs syndrome: Secondary | ICD-10-CM | POA: Diagnosis not present

## 2020-03-29 DIAGNOSIS — Z792 Long term (current) use of antibiotics: Secondary | ICD-10-CM | POA: Diagnosis not present

## 2020-03-29 DIAGNOSIS — E559 Vitamin D deficiency, unspecified: Secondary | ICD-10-CM | POA: Diagnosis not present

## 2020-03-29 DIAGNOSIS — E538 Deficiency of other specified B group vitamins: Secondary | ICD-10-CM | POA: Diagnosis not present

## 2020-03-29 DIAGNOSIS — K219 Gastro-esophageal reflux disease without esophagitis: Secondary | ICD-10-CM | POA: Diagnosis not present

## 2020-03-29 DIAGNOSIS — I2510 Atherosclerotic heart disease of native coronary artery without angina pectoris: Secondary | ICD-10-CM | POA: Diagnosis not present

## 2020-03-29 DIAGNOSIS — Z7901 Long term (current) use of anticoagulants: Secondary | ICD-10-CM | POA: Diagnosis not present

## 2020-03-29 DIAGNOSIS — M810 Age-related osteoporosis without current pathological fracture: Secondary | ICD-10-CM | POA: Diagnosis not present

## 2020-03-29 DIAGNOSIS — I272 Pulmonary hypertension, unspecified: Secondary | ICD-10-CM | POA: Diagnosis not present

## 2020-03-29 DIAGNOSIS — G9009 Other idiopathic peripheral autonomic neuropathy: Secondary | ICD-10-CM | POA: Diagnosis not present

## 2020-03-29 DIAGNOSIS — I4891 Unspecified atrial fibrillation: Secondary | ICD-10-CM | POA: Diagnosis not present

## 2020-03-29 DIAGNOSIS — I872 Venous insufficiency (chronic) (peripheral): Secondary | ICD-10-CM | POA: Diagnosis not present

## 2020-03-29 DIAGNOSIS — M1990 Unspecified osteoarthritis, unspecified site: Secondary | ICD-10-CM | POA: Diagnosis not present

## 2020-03-29 DIAGNOSIS — S62102D Fracture of unspecified carpal bone, left wrist, subsequent encounter for fracture with routine healing: Secondary | ICD-10-CM | POA: Diagnosis not present

## 2020-03-29 DIAGNOSIS — I5032 Chronic diastolic (congestive) heart failure: Secondary | ICD-10-CM | POA: Diagnosis not present

## 2020-03-29 DIAGNOSIS — E7849 Other hyperlipidemia: Secondary | ICD-10-CM | POA: Diagnosis not present

## 2020-03-29 DIAGNOSIS — N1831 Chronic kidney disease, stage 3a: Secondary | ICD-10-CM | POA: Diagnosis not present

## 2020-03-29 DIAGNOSIS — M5136 Other intervertebral disc degeneration, lumbar region: Secondary | ICD-10-CM | POA: Diagnosis not present

## 2020-03-30 DIAGNOSIS — Z9181 History of falling: Secondary | ICD-10-CM | POA: Diagnosis not present

## 2020-03-30 DIAGNOSIS — M810 Age-related osteoporosis without current pathological fracture: Secondary | ICD-10-CM | POA: Diagnosis not present

## 2020-03-30 DIAGNOSIS — I130 Hypertensive heart and chronic kidney disease with heart failure and stage 1 through stage 4 chronic kidney disease, or unspecified chronic kidney disease: Secondary | ICD-10-CM | POA: Diagnosis not present

## 2020-03-30 DIAGNOSIS — Z792 Long term (current) use of antibiotics: Secondary | ICD-10-CM | POA: Diagnosis not present

## 2020-03-30 DIAGNOSIS — S62102D Fracture of unspecified carpal bone, left wrist, subsequent encounter for fracture with routine healing: Secondary | ICD-10-CM | POA: Diagnosis not present

## 2020-03-30 DIAGNOSIS — I872 Venous insufficiency (chronic) (peripheral): Secondary | ICD-10-CM | POA: Diagnosis not present

## 2020-03-30 DIAGNOSIS — L97513 Non-pressure chronic ulcer of other part of right foot with necrosis of muscle: Secondary | ICD-10-CM | POA: Diagnosis not present

## 2020-03-30 DIAGNOSIS — K219 Gastro-esophageal reflux disease without esophagitis: Secondary | ICD-10-CM | POA: Diagnosis not present

## 2020-03-30 DIAGNOSIS — Z7901 Long term (current) use of anticoagulants: Secondary | ICD-10-CM | POA: Diagnosis not present

## 2020-03-30 DIAGNOSIS — M1990 Unspecified osteoarthritis, unspecified site: Secondary | ICD-10-CM | POA: Diagnosis not present

## 2020-03-30 DIAGNOSIS — I2510 Atherosclerotic heart disease of native coronary artery without angina pectoris: Secondary | ICD-10-CM | POA: Diagnosis not present

## 2020-03-30 DIAGNOSIS — E7849 Other hyperlipidemia: Secondary | ICD-10-CM | POA: Diagnosis not present

## 2020-03-30 DIAGNOSIS — G2581 Restless legs syndrome: Secondary | ICD-10-CM | POA: Diagnosis not present

## 2020-03-30 DIAGNOSIS — I272 Pulmonary hypertension, unspecified: Secondary | ICD-10-CM | POA: Diagnosis not present

## 2020-03-30 DIAGNOSIS — N1831 Chronic kidney disease, stage 3a: Secondary | ICD-10-CM | POA: Diagnosis not present

## 2020-03-30 DIAGNOSIS — I4891 Unspecified atrial fibrillation: Secondary | ICD-10-CM | POA: Diagnosis not present

## 2020-03-30 DIAGNOSIS — I739 Peripheral vascular disease, unspecified: Secondary | ICD-10-CM | POA: Diagnosis not present

## 2020-03-30 DIAGNOSIS — I10 Essential (primary) hypertension: Secondary | ICD-10-CM | POA: Diagnosis not present

## 2020-03-30 DIAGNOSIS — Z7982 Long term (current) use of aspirin: Secondary | ICD-10-CM | POA: Diagnosis not present

## 2020-03-30 DIAGNOSIS — M5136 Other intervertebral disc degeneration, lumbar region: Secondary | ICD-10-CM | POA: Diagnosis not present

## 2020-03-30 DIAGNOSIS — G9009 Other idiopathic peripheral autonomic neuropathy: Secondary | ICD-10-CM | POA: Diagnosis not present

## 2020-03-30 DIAGNOSIS — E538 Deficiency of other specified B group vitamins: Secondary | ICD-10-CM | POA: Diagnosis not present

## 2020-03-30 DIAGNOSIS — E559 Vitamin D deficiency, unspecified: Secondary | ICD-10-CM | POA: Diagnosis not present

## 2020-03-30 DIAGNOSIS — I5032 Chronic diastolic (congestive) heart failure: Secondary | ICD-10-CM | POA: Diagnosis not present

## 2020-04-02 ENCOUNTER — Other Ambulatory Visit: Payer: Self-pay

## 2020-04-02 ENCOUNTER — Emergency Department
Admission: EM | Admit: 2020-04-02 | Discharge: 2020-04-02 | Disposition: A | Discharge status: Home / Self Care | Payer: Medicare Other | Attending: Emergency Medicine | Admitting: Emergency Medicine

## 2020-04-02 DIAGNOSIS — Z7982 Long term (current) use of aspirin: Secondary | ICD-10-CM | POA: Insufficient documentation

## 2020-04-02 DIAGNOSIS — Z79899 Other long term (current) drug therapy: Secondary | ICD-10-CM | POA: Diagnosis not present

## 2020-04-02 DIAGNOSIS — Z955 Presence of coronary angioplasty implant and graft: Secondary | ICD-10-CM | POA: Insufficient documentation

## 2020-04-02 DIAGNOSIS — I130 Hypertensive heart and chronic kidney disease with heart failure and stage 1 through stage 4 chronic kidney disease, or unspecified chronic kidney disease: Secondary | ICD-10-CM | POA: Insufficient documentation

## 2020-04-02 DIAGNOSIS — N183 Chronic kidney disease, stage 3 unspecified: Secondary | ICD-10-CM | POA: Insufficient documentation

## 2020-04-02 DIAGNOSIS — Z87891 Personal history of nicotine dependence: Secondary | ICD-10-CM | POA: Diagnosis not present

## 2020-04-02 DIAGNOSIS — R040 Epistaxis: Secondary | ICD-10-CM | POA: Diagnosis not present

## 2020-04-02 DIAGNOSIS — I5032 Chronic diastolic (congestive) heart failure: Secondary | ICD-10-CM | POA: Diagnosis not present

## 2020-04-02 DIAGNOSIS — R58 Hemorrhage, not elsewhere classified: Secondary | ICD-10-CM | POA: Diagnosis not present

## 2020-04-02 DIAGNOSIS — Z743 Need for continuous supervision: Secondary | ICD-10-CM | POA: Diagnosis not present

## 2020-04-02 DIAGNOSIS — Z85828 Personal history of other malignant neoplasm of skin: Secondary | ICD-10-CM | POA: Diagnosis not present

## 2020-04-02 DIAGNOSIS — R6889 Other general symptoms and signs: Secondary | ICD-10-CM | POA: Diagnosis not present

## 2020-04-02 MED ORDER — LABETALOL HCL 5 MG/ML IV SOLN: 10.0000 mg | Freq: Once | INTRAVENOUS | Status: DC

## 2020-04-02 MED ORDER — CEPHALEXIN 500 MG PO CAPS: 500.0000 mg | ORAL_CAPSULE | Freq: Two times a day (BID) | ORAL | 0 refills | Status: AC

## 2020-04-02 NOTE — ED Notes (Signed)
nare bleeding has improved. Pt tolerating nasal tampon well. Call bell at right side.

## 2020-04-02 NOTE — ED Provider Notes (Signed)
[facility] Emergency Department Provider Note   ____________________________________________    I have reviewed the triage vital signs and the nursing notes.   HISTORY  Chief Complaint Epistaxis     HPI [name] is a 86 y.o. female with history as noted below on Eliquis for atrial fibrillation who presents with a nosebleed.  Patient reports left naris started bleeding approximately 2 hours prior to arrival.  She was unable stop it on her own.  No trauma or injury to the area.  She does have a history of hypertension.  Past Medical History:  Diagnosis Date  . Abdominal aortic atherosclerosis ([place]) 09/2015   by xray  . Anxiety   . BCC (basal cell carcinoma of skin) 05/2011   on nose  . Breast lesion    a. diagnosed with complex sclerosing lesion with calcifications of the right breast in 08/2016  . Chest pain    a. nuclear stress test 03/2012: normal; b. 09/2018 MV: EF >65%, no isch/scar.   . DDD (degenerative disc disease), lumbosacral 12/06/1992  . Gallstones 09/2015   incidentally by xray  . GERD (gastroesophageal reflux disease) 05/1999  . HERPES ZOSTER 04/13/2007   Qualifier: Diagnosis of  By: [name], [name]   . Hiatal hernia   . History of shingles    ophthalmic, takes acyclovir daily  . Hyperlipidemia 12/2003  . Hypertension 1990  . Mitral regurgitation    a. echo 03/2012: EF 60-65%, no RWMA, mild to mod AI/MR, mod TR, PASP 37 mmHg  . Osteoarthritis 08/21/1989  . Osteoporosis with fracture 11/1997   with compression fracture T12  . Persistent atrial fibrillation ([place]) 03/16/2012   a. CHADS2VASc => 5 (HTN, age x 2, vascular disease, sex category)-->Eliquis 5 BID;  b. Recurrent AF in 06/2016;  c. 01/2017 s/p DCCV;  d. 02/2017 recurrent AFib->Amio added->DCCV; e. 03/2017 recurrent AFib, s/p reload of amio and DCCV 04/02/2017  . POSTHERPETIC NEURALGIA 04/20/2007   Qualifier: Diagnosis of  By: [name], [name]    . Rheumatoid arthritis ([place])   . RLS (restless legs syndrome)     Patient Active Problem List   Diagnosis Date Noted  . MCI (mild cognitive impairment) with memory loss 12/28/2019  . Chronic diastolic CHF (congestive heart failure) ([place]) 10/25/2019  . DNR (do not resuscitate) 10/25/2019  . Closed fracture of left distal radius and ulna 10/25/2019  . Closed fracture of right wrist   . Palliative care by specialist   . CKD (chronic kidney disease) stage 3, GFR 30-59 ml/min ([place]) 10/24/2019  . Ulcer of right foot ([place]) 08/14/2019  . CAD (coronary artery disease), native coronary artery 07/13/2019  . Dyspnea on exertion   . Pulmonary hypertension, unspecified ([place])   . Urge incontinence 05/06/2019  . Skin lesion on examination 05/06/2019  . PAD (peripheral artery disease) ([place]) 03/12/2019  . Hypertrophic toenail 01/27/2019  . Cyanosis 01/12/2019  . Hemorrhoid 11/25/2018  . Vitamin B12 deficiency 08/04/2018  . Bilateral leg and foot pain 07/31/2018  . Vitamin D deficiency 02/25/2018  . Peripheral neuropathy 12/04/2017  . General unsteadiness 10/01/2017  . Chronic constipation 10/01/2017  . Esophageal dysphagia 04/08/2017  . Chronic radicular pain of lower back 04/08/2017  . Fall with injury 03/04/2017  . Encounter for chronic pain management 11/11/2016  . Breast mass, right 09/29/2016  . Abdominal aortic atherosclerosis ([place]) 09/07/2015  . Insomnia 06/08/2015  . Health maintenance examination 12/08/2014  . Advanced care planning/counseling discussion 12/08/2014  .  Chronic leg pain 10/13/2014  . Fatigue 10/28/2013  . Persistent atrial fibrillation ([place]) 04/24/2013  . Medicare annual wellness visit, subsequent 04/14/2012  . Chest tightness 03/16/2012  . RLS (restless legs syndrome) 02/07/2012  . DDD (degenerative disc disease), lumbar 12/18/2009  . MDD (major depressive disorder), recurrent episode ([place]) 12/10/2007  . HLD (hyperlipidemia) 11/24/2006  . PLMD (periodic limb  movement disorder) 11/24/2006  . Essential hypertension 11/24/2006  . GERD 11/24/2006  . GASTRIC ULCER 11/24/2006  . Osteoarthritis 11/24/2006  . Osteoporosis 11/24/2006    Past Surgical History:  Procedure Laterality Date  . ABDOMINAL HYSTERECTOMY  Age 6 - 61   S/P TAH  . abdominal ultrasound  04/23/2004   Gallstones  . BREAST EXCISIONAL BIOPSY Right 2018   neg/benign complex sclerosing lesion  . BREAST LUMPECTOMY WITH RADIOACTIVE SEED LOCALIZATION Right 10/2016   breast lumpectomy with radioactive seed localization and margin assessment for complex sclerosing lesion [facility])  . BREAST LUMPECTOMY WITH RADIOACTIVE SEED LOCALIZATION Right 11/05/2016   Procedure: RIGHT BREAST LUMPECTOMY WITH RADIOACTIVE SEED LOCALIZATION;  Surgeon: Fanny Skates, MD;  Location: [place];  Service: General;  Laterality: Right;  . CARDIAC CATHETERIZATION    . CARDIOVERSION N/A 01/31/2017   Procedure: CARDIOVERSION;  Surgeon: Wellington Hampshire, MD;  Location: [facility];  Service: Cardiovascular;  Laterality: N/A;  . CARDIOVERSION N/A 03/03/2017   Procedure: CARDIOVERSION;  Surgeon: Wellington Hampshire, MD;  Location: [place] [facility];  Service: Cardiovascular;  Laterality: N/A;  . CARDIOVERSION N/A 04/02/2017   Procedure: CARDIOVERSION;  Surgeon: Wellington Hampshire, MD;  Location: [facility];  Service: Cardiovascular;  Laterality: N/A;  . CARDIOVERSION N/A 01/16/2018   Procedure: CARDIOVERSION;  Surgeon: Wellington Hampshire, MD;  Location: [facility];  Service: Cardiovascular;  Laterality: N/A;  . CATARACT EXTRACTION  05/2010   left eye  . CERVICAL DISCECTOMY  1992   Fusion (Dr. Joya Salm)  . ESI Bilateral 01/2016   S1 transforaminal ESI x3  . ESOPHAGOGASTRODUODENOSCOPY  01/01/2002   with ulcer, bx. neg; + stricture gastric ulcer with hemorrhage  . ESOPHAGOGASTRODUODENOSCOPY  04/23/2004   Esophageal stricture -- dilated  . INTRAVASCULAR PRESSURE WIRE/FFR STUDY N/A 07/12/2019   Procedure:  INTRAVASCULAR PRESSURE WIRE/FFR STUDY;  Surgeon: Wellington Hampshire, MD;  Location: [place] [facility];  Service: Cardiovascular;  Laterality: N/A;  . LOWER EXTREMITY ANGIOGRAPHY Left 02/01/2019   LOWER EXTREMITY ANGIOGRAPHY;  Surgeon: Algernon Huxley, MD  . LOWER EXTREMITY ANGIOGRAPHY Right 03/25/2019   Procedure: LOWER EXTREMITY ANGIOGRAPHY;  Surgeon: Algernon Huxley, MD;  Location: [place] [facility];  Service: Cardiovascular;  Laterality: Right;  . LOWER EXTREMITY ANGIOGRAPHY Right 08/02/2019   Procedure: LOWER EXTREMITY ANGIOGRAPHY;  Surgeon: Algernon Huxley, MD;  Location: [place] [facility];  Service: Cardiovascular;  Laterality: Right;  . nuclear stress test  03/2012   no ischemia  . PERIPHERAL VASCULAR BALLOON ANGIOPLASTY Left 01/2019   Percutaneous transluminal angioplasty of left anterior and posterior tibial artery with 2.5 mm diameter by 22 cm length angioplasty balloon ([place])  . RIGHT/LEFT HEART CATH AND CORONARY ANGIOGRAPHY Bilateral 07/12/2019   Procedure: RIGHT/LEFT HEART CATH AND CORONARY ANGIOGRAPHY;  Surgeon: Wellington Hampshire, MD;  Location: [place] [facility];  Service: Cardiovascular;  Laterality: Bilateral;  . SKIN CANCER EXCISION  05/20/2011   BCC from nose  . US ECHOCARDIOGRAPHY  03/2012   in flutter, EF 60%, mild-mod aortic, mitral, tricuspid regurg, mildly dilated LA    Prior to Admission medications   Medication Sig Start  Date End Date Taking? Authorizing Provider  acetaminophen (TYLENOL) 500 MG tablet Take 1,000 mg every 8 (eight) hours as needed by mouth for moderate pain.     [provider]  acyclovir (ZOVIRAX) 400 MG tablet Take 400 mg by mouth 2 (two) times daily.    [provider]  amiodarone (PACERONE) 100 MG tablet Take 1 tablet (100 mg total) by mouth daily. Patient taking differently: Take 100 mg by mouth daily. Takes 1/2 tablet in morning. 10/27/19   Nolberto Hanlon, MD  aspirin EC 81 MG tablet Take 1 tablet (81 mg total) by mouth daily.  02/01/19   Algernon Huxley, MD  atorvastatin (LIPITOR) 20 MG tablet TAKE 1 TABLET BY MOUTH ONCE DAILY Patient taking differently: Take 20 mg by mouth daily. 10/14/19   Ria Bush, MD  cephALEXin (KEFLEX) 500 MG capsule Take 1 capsule (500 mg total) by mouth 2 (two) times daily for 5 days. 04/02/20 04/07/20  Lavonia Drafts, MD  DULoxetine (CYMBALTA) 60 MG capsule TAKE 1 CAPSULE BY MOUTH ONCE DAILY Patient taking differently: Take 60 mg by mouth daily. 10/14/19   Ria Bush, MD  ELIQUIS 2.5 MG TABS tablet TAKE 1 TABLET BY MOUTH TWICE DAILY Patient taking differently: Take 2.5 mg by mouth 2 (two) times daily. 10/12/19   Wellington Hampshire, MD  ergocalciferol (VITAMIN D2) 1.25 MG (50000 UT) capsule Take 1 capsule (50,000 Units total) by mouth once a week. Patient taking differently: Take 50,000 Units by mouth once a week. Saturday 05/17/19   Ria Bush, MD  gabapentin (NEURONTIN) 300 MG capsule Take 1 capsule (300 mg total) by mouth at bedtime. With second dose as needed 03/22/20   Ria Bush, MD  lisinopril (ZESTRIL) 10 MG tablet Take 1 tablet (10 mg total) by mouth daily. 02/21/20   Ria Bush, MD  memantine (NAMENDA) 5 MG tablet TAKE 1 TABLET BY MOUTH TWICE DAILY 03/14/20   Ria Bush, MD  metoprolol tartrate (LOPRESSOR) 25 MG tablet TAKE 1/2 TABLET BY MOUTH TWICE DAILY 01/13/20   Wellington Hampshire, MD  pantoprazole (PROTONIX) 40 MG tablet TAKE 1 TABLET BY MOUTH EVERY DAY Patient taking differently: Take 40 mg by mouth daily. 10/14/19   Ria Bush, MD  polyethylene glycol (MIRALAX / GLYCOLAX) 17 g packet Take 17 g by mouth daily as needed for mild constipation. 10/27/19   Nolberto Hanlon, MD  prednisoLONE acetate (PRED FORTE) 1 % ophthalmic suspension Place 1 drop into the left eye daily.     [provider]  rOPINIRole (REQUIP) 1 MG tablet Take 2.5 tablets (2.5 mg total) by mouth at bedtime. 03/30/20   Ria Bush, MD  senna (SENOKOT) 8.6 MG TABS tablet  Take 1 tablet (8.6 mg total) by mouth daily as needed for mild constipation. 03/23/20   Ria Bush, MD  traMADol (ULTRAM) 50 MG tablet Take 50 mg by mouth daily. Takes 2 tablets daily 12/04/17   Ria Bush, MD     Allergies Penicillins  Family History  Problem Relation Age of Onset  . Emphysema Mother   . Heart disease Father        MI  . Stroke Father        first at age 53.  . Stroke Sister   . Diabetes Sister   . Hypertension Brother   . Heart disease Brother        Heart Valve  . Stroke Brother   . Diabetes Brother   . Cancer Brother  esophageal  . Diabetes Brother   . Diabetes Brother   . Colon cancer Neg Hx     Social History Social History   Tobacco Use  . Smoking status: Former Smoker    Packs/day: 0.25    Years: 1.00    Pack years: 0.25    Quit date: 04/08/1976    Years since quitting: 44.0  . Smokeless tobacco: Never Used  . Tobacco comment: pt smokes occasionally "socially"  Vaping Use  . Vaping Use: Never used  Substance Use Topics  . Alcohol use: No    Alcohol/week: 1.0 standard drink    Types: 1 Glasses of wine per week  . Drug use: No    Review of Systems  Constitutional: No fever/chills Eyes: No visual changes.  ENT: As above Cardiovascular: Denies chest pain. Respiratory: Denies shortness of breath. Gastrointestinal: Mild nausea     ____________________________________________   PHYSICAL EXAM:  VITAL SIGNS: ED Triage Vitals  Enc Vitals Group     BP 04/02/20 0430 (!) 208/99     Pulse Rate 04/02/20 0430 62     Resp 04/02/20 0430 20     Temp --      Temp src --      SpO2 04/02/20 0430 100 %     Weight 04/02/20 0432 61.3 kg (135 lb 2.3 oz)     Height 04/02/20 0432 1.575 m (5\' 2" )     Head Circumference --      Peak Flow --      Pain Score 04/02/20 0431 0     Pain Loc --      Pain Edu? --      Excl. in [place]? --     Constitutional: Alert and oriented.  Eyes: Conjunctivae are normal.  Head:  Atraumatic. Nose: Patient with nasal clamp on nose, spitting out blood Mouth/Throat: Mucous membranes are moist.    Cardiovascular: Normal rate   Good peripheral circulation. Respiratory: Normal respiratory effort.  No retractions.  Gastrointestinal: Soft and nontender. No distention  Musculoskeletal: N Warm and well perfused Neurologic:  Normal speech and language. No gross focal neurologic deficits are appreciated.  Skin:  Skin is warm, dry and intact. No rash noted. Psychiatric: Mood and affect are normal. Speech and behavior are normal.  ____________________________________________   LABS (all labs ordered are listed, but only abnormal results are displayed)  Labs Reviewed - No data to display ____________________________________________  EKG  None ____________________________________________  RADIOLOGY  None ____________________________________________   PROCEDURES  Procedure(s) performed: yes  .Epistaxis Management  Date/Time: 04/02/2020 4:52 AM Performed by: Lavonia Drafts, MD Authorized by: Lavonia Drafts, MD   Consent:    Consent obtained:  Verbal   Consent given by:  Patient   Risks, benefits, and alternatives were discussed: yes     Risks discussed:  Pain, nasal injury and bleeding Universal protocol:    Patient identity confirmed:  Verbally with patient and arm band Procedure details:    Treatment site:  Unable to specify   Treatment method:  Merocel sponge   Treatment episode: initial   Post-procedure details:    Assessment:  Bleeding decreased   Procedure completion:  Tolerated     Critical Care performed: No ____________________________________________   INITIAL IMPRESSION / ASSESSMENT AND PLAN / ED COURSE  Pertinent labs & imaging results that were available during my care of the patient were reviewed by me and considered in my medical decision making (see chart for details).  Patient presents with epistaxis as above,  she is on  Eliquis.  Appears consistent with anterior bleed.  No improvement with nasal clamp placed by ENT, and placed lidocaine soaked cotton balls, removed and then placed a Merocel sponge.  ----------------------------------------- 6:02 AM on 04/02/2020 ----------------------------------------- Patient's bleeding has been controlled.  Appropriate for discharge with close follow-up with ENT, antibiotic prophylaxis prescribed.     ____________________________________________   FINAL CLINICAL IMPRESSION(S) / ED DIAGNOSES  Final diagnoses:  Left-sided epistaxis        Note:  This document was prepared using Dragon voice recognition software and may include unintentional dictation errors.   Lavonia Drafts, MD 04/02/20 [phone]

## 2020-04-02 NOTE — ED Triage Notes (Signed)
Patient from home via [facility] with c/o nosebleed x1 hour. Patient on eliquis.  Patient currently A&O x4.

## 2020-04-03 ENCOUNTER — Telehealth: Payer: Self-pay

## 2020-04-03 DIAGNOSIS — L97513 Non-pressure chronic ulcer of other part of right foot with necrosis of muscle: Secondary | ICD-10-CM | POA: Diagnosis not present

## 2020-04-03 DIAGNOSIS — I272 Pulmonary hypertension, unspecified: Secondary | ICD-10-CM | POA: Diagnosis not present

## 2020-04-03 DIAGNOSIS — M1990 Unspecified osteoarthritis, unspecified site: Secondary | ICD-10-CM | POA: Diagnosis not present

## 2020-04-03 DIAGNOSIS — I872 Venous insufficiency (chronic) (peripheral): Secondary | ICD-10-CM | POA: Diagnosis not present

## 2020-04-03 DIAGNOSIS — S62102D Fracture of unspecified carpal bone, left wrist, subsequent encounter for fracture with routine healing: Secondary | ICD-10-CM | POA: Diagnosis not present

## 2020-04-03 DIAGNOSIS — G9009 Other idiopathic peripheral autonomic neuropathy: Secondary | ICD-10-CM | POA: Diagnosis not present

## 2020-04-03 DIAGNOSIS — Z7901 Long term (current) use of anticoagulants: Secondary | ICD-10-CM | POA: Diagnosis not present

## 2020-04-03 DIAGNOSIS — Z9181 History of falling: Secondary | ICD-10-CM | POA: Diagnosis not present

## 2020-04-03 DIAGNOSIS — I739 Peripheral vascular disease, unspecified: Secondary | ICD-10-CM | POA: Diagnosis not present

## 2020-04-03 DIAGNOSIS — G2581 Restless legs syndrome: Secondary | ICD-10-CM | POA: Diagnosis not present

## 2020-04-03 DIAGNOSIS — K219 Gastro-esophageal reflux disease without esophagitis: Secondary | ICD-10-CM | POA: Diagnosis not present

## 2020-04-03 DIAGNOSIS — I2510 Atherosclerotic heart disease of native coronary artery without angina pectoris: Secondary | ICD-10-CM | POA: Diagnosis not present

## 2020-04-03 DIAGNOSIS — Z792 Long term (current) use of antibiotics: Secondary | ICD-10-CM | POA: Diagnosis not present

## 2020-04-03 DIAGNOSIS — E559 Vitamin D deficiency, unspecified: Secondary | ICD-10-CM | POA: Diagnosis not present

## 2020-04-03 DIAGNOSIS — E538 Deficiency of other specified B group vitamins: Secondary | ICD-10-CM | POA: Diagnosis not present

## 2020-04-03 DIAGNOSIS — M5136 Other intervertebral disc degeneration, lumbar region: Secondary | ICD-10-CM | POA: Diagnosis not present

## 2020-04-03 DIAGNOSIS — E7849 Other hyperlipidemia: Secondary | ICD-10-CM | POA: Diagnosis not present

## 2020-04-03 DIAGNOSIS — N1831 Chronic kidney disease, stage 3a: Secondary | ICD-10-CM | POA: Diagnosis not present

## 2020-04-03 DIAGNOSIS — I130 Hypertensive heart and chronic kidney disease with heart failure and stage 1 through stage 4 chronic kidney disease, or unspecified chronic kidney disease: Secondary | ICD-10-CM | POA: Diagnosis not present

## 2020-04-03 DIAGNOSIS — I5032 Chronic diastolic (congestive) heart failure: Secondary | ICD-10-CM | POA: Diagnosis not present

## 2020-04-03 DIAGNOSIS — Z7982 Long term (current) use of aspirin: Secondary | ICD-10-CM | POA: Diagnosis not present

## 2020-04-03 DIAGNOSIS — M810 Age-related osteoporosis without current pathological fracture: Secondary | ICD-10-CM | POA: Diagnosis not present

## 2020-04-03 DIAGNOSIS — I10 Essential (primary) hypertension: Secondary | ICD-10-CM | POA: Diagnosis not present

## 2020-04-03 DIAGNOSIS — I4891 Unspecified atrial fibrillation: Secondary | ICD-10-CM | POA: Diagnosis not present

## 2020-04-03 NOTE — Telephone Encounter (Signed)
Pat (DPR signed) pt seen [facility] ED and have nose packed due to nose bleed. Pt continues to drip blood and pt sneezed last night that causes the bleeding to increase; Pat said the bleeding is back to a drip now; pat said she had spoken with ENT office and pt has appt next Mon with Dr Jenne Campus and pt could not be seen sooner. Dennie Bible wants to know if pt should stop the Eliquis 2.5 mg taking bid. Dr Reece Agar said to hold the Eliquis since pt is bleeding; let pat know this could increase the stroke risk (pat notified and voiced understanding). Pt is to hold the eliquis 2/5 mg bid. DR G also said pts BP was elevated at ED and to monitor pts BP; BP under better control will help with nose bleed as well. If pt's bleeding does not subside or stop after holding the eliquis; Dr Reece Agar said to call the ENT back. UC & ED precautions given and pat voiced understanding and very appreciative of Dr Reece Agar. Sending note to DR G.

## 2020-04-04 ENCOUNTER — Encounter: Payer: Self-pay | Admitting: Podiatry

## 2020-04-04 ENCOUNTER — Ambulatory Visit (INDEPENDENT_AMBULATORY_CARE_PROVIDER_SITE_OTHER): Payer: Medicare Other | Admitting: Podiatry

## 2020-04-04 ENCOUNTER — Other Ambulatory Visit: Payer: Self-pay

## 2020-04-04 DIAGNOSIS — B351 Tinea unguium: Secondary | ICD-10-CM

## 2020-04-04 DIAGNOSIS — I739 Peripheral vascular disease, unspecified: Secondary | ICD-10-CM | POA: Diagnosis not present

## 2020-04-04 DIAGNOSIS — M79675 Pain in left toe(s): Secondary | ICD-10-CM | POA: Diagnosis not present

## 2020-04-04 DIAGNOSIS — M79674 Pain in right toe(s): Secondary | ICD-10-CM | POA: Diagnosis not present

## 2020-04-04 NOTE — Telephone Encounter (Signed)
[facility] Day - Client TELEPHONE ADVICE RECORD Access[employer]urse Patient Name: [name] Gender: Female DOB: 1929-05-26 Age: 86 Y 10 M Return Phone Number: [phone] (Primary), [phone] (Secondary) Address: City/State/Zip: [place] Client [facility] Day - Client Client Site [facility] - Day Physician Eustaquio Boyden - MD Contact Type Call Who Is Calling Patient / Member / Family / Caregiver Call Type Triage / Clinical Relationship To Patient Self Return Phone Number [phone] (Primary) Chief Complaint NOSEBLEED - >30 minutes Reason for Call Symptomatic / Request for Health Information Initial Comment Daughter, [name] is on the line. Pt was in ER yesterday with nosebleed and they packed her nose. She sneezed and it has been bleeding. She is wondering if she needs to stop taking her blood thinner. Her nose has been continuously since last night. Translation No Nurse Assessment Nurse: [place], RN, Marchelle Folks Date/Time (Eastern Time): 04/03/2020 4:35:57 PM Confirm and document reason for call. If symptomatic, describe symptoms. ---caller states mom had a nose bleed on christmas morning. they call 911 because her nose was pouring blood. ER packed her nose and went home. her mother called her late last night after patient sneezed and the nose was bleeding again. today has been a slow trickle. mom has a HH nurse and nurse suggested calling since her mom is on eliquis. mother was referred to ENT doc and she has an appointment next week. mother is on antibiotic started by ER. caller is asking about stopping the eliquis. taking eliquis for afib but NSR right now. Does the patient have any new or worsening symptoms? ---Yes Will a triage be completed? ---Yes Related visit to physician within the last 2 weeks? ---Yes Does the PT have any chronic conditions? (i.e. diabetes, asthma, this includes High risk factors  for pregnancy, etc.) ---Yes List chronic conditions. ---afib Is this a behavioral health or substance abuse call? ---No Guidelines Guideline Title Affirmed Question Affirmed Notes Nurse Date/Time (Eastern Time) Nosebleed Taking Coumadin (warfarin) or other strong blood thinner, or known [place], RN, Marchelle Folks 04/03/2020 4:42:02 PM PLEASE NOTE: All timestamps contained within this report are represented as [place] Standard Time. CONFIDENTIALTY NOTICE: This fax transmission is intended only for the addressee. It contains information that is legally privileged, confidential or otherwise protected from use or disclosure. If you are not the intended recipient, you are strictly prohibited from reviewing, disclosing, copying using or disseminating any of this information or taking any action in reliance on or regarding this information. If you have received this fax in error, please notify us immediately by telephone so that we can arrange for its return to [place]. Phone: [phone], Toll-Free: [phone], Fax: [phone] Page: 2 of 2 Call Id: [id number] Guidelines Guideline Title Affirmed Question Affirmed Notes Nurse Date/Time Lamount Cohen Time) bleeding disorder (e.g., thrombocytopenia) Disp. Time Lamount Cohen Time) Disposition Final User 04/03/2020 4:49:25 PM See PCP within 24 Hours Yes [place], RN, Earnestine Leys Disagree/Comply Comply Caller Understands Yes PreDisposition Did not know what to do Care Advice Given Per Guideline SEE PCP WITHIN 24 HOURS: * IF OFFICE WILL BE OPEN: You need to be examined within the next 24 hours. Call your doctor (or NP/PA) when the office opens and make an appointment. CARE ADVICE given per Nosebleed (Adult) guideline. CALL BACK IF: * Nosebleeding lasts longer than 30 minutes with using direct pressure * Lightheadedness or weakness occurs * Nosebleeds become worse * You become worse Referrals REFERRED TO PCP OFFICE

## 2020-04-04 NOTE — Progress Notes (Signed)
  Subjective:  Patient ID: [name], female    DOB: Aug 10, 1929,  MRN: [id number]  Chief Complaint  Patient presents with  . Nail Problem    Nail trim and RFC   86 y.o. female returns for the above complaint.  Patient presents with thickened elongated dystrophic toenails x10.  Patient states that they are painful to touch.  She is not able to take care of her self.  Patient has a history of peripheral vascular disease without good circulation to both lower extremity.   Objective:  There were no vitals filed for this visit. Podiatric Exam: Vascular: dorsalis pedis and posterior tibial pulses are non palpable bilateral. Capillary return is sluggish temperature gradient is WNL. Skin turgor WNL.  Dependent rubor noted to bilateral lower extremity Sensorium: Normal Semmes Weinstein monofilament test. Normal tactile sensation bilaterally. Nail Exam: Pt has thick disfigured discolored nails with subungual debris noted bilateral entire nail hallux through fifth toenails.  Pain on palpation to the nails. Ulcer Exam: There is no evidence of ulcer or pre-ulcerative changes or infection. Orthopedic Exam: Muscle tone and strength are WNL. No limitations in general ROM. No crepitus or effusions noted. HAV  B/L.  Hammer toes 2-5  B/L. Skin: No Porokeratosis. No infection or ulcers    Assessment & Plan:   1. Pain due to onychomycosis of toenails of both feet   2. Peripheral vascular disease (HCC)     Patient was evaluated and treated and all questions answered.  Right lateral submetatarsal 5 ulceration -This wound is primarily managed by the wound care center.  I will defer further recommendation to them.  Onychomycosis with pain  -Nails palliatively debrided as below. -Educated on self-care  Procedure: Nail Debridement Rationale: pain  Type of Debridement: manual, sharp debridement. Instrumentation: Nail nipper, rotary burr. Number of Nails: 10  Procedures and Treatment: Consent by  patient was obtained for treatment procedures. The patient understood the discussion of treatment and procedures well. All questions were answered thoroughly reviewed. Debridement of mycotic and hypertrophic toenails, 1 through 5 bilateral and clearing of subungual debris. No ulceration, no infection noted.  Return Visit-Office Procedure: Patient instructed to return to the office for a follow up visit 3 months for continued evaluation and treatment.  Nicholes Rough, DPM    No follow-ups on file.

## 2020-04-04 NOTE — Telephone Encounter (Signed)
Spoke with pt's daughter, [name] (on dpr), asking about nosebleed.  States it's better, even when lying down.  Eliquis has not been stopped.  Had podiatry appt today.

## 2020-04-04 NOTE — Telephone Encounter (Signed)
Please call today for update on nosebleed.

## 2020-04-05 ENCOUNTER — Telehealth: Payer: Self-pay | Admitting: Family Medicine

## 2020-04-05 ENCOUNTER — Other Ambulatory Visit: Payer: Self-pay | Admitting: Family Medicine

## 2020-04-05 NOTE — Telephone Encounter (Signed)
Fayrene Fearing returning call.  I informed him Dr. Reece Agar agrees with holding PT until after 04/10/20.  He verbalizes understanding and will notify his office.

## 2020-04-05 NOTE — Telephone Encounter (Signed)
Ok to do this. Thanks. 

## 2020-04-05 NOTE — Telephone Encounter (Signed)
Verneda Skill from [facility] needs verbal order to PT on hold until after 04/10/20 due to the server nose bleed she had.

## 2020-04-05 NOTE — Telephone Encounter (Signed)
Lvm asking [name] to call back.  Need to inform him Dr. Reece Agar is giving verbal orders for services requested.

## 2020-04-06 ENCOUNTER — Other Ambulatory Visit: Payer: Medicare Other | Admitting: Adult Health Nurse Practitioner

## 2020-04-06 DIAGNOSIS — S62102D Fracture of unspecified carpal bone, left wrist, subsequent encounter for fracture with routine healing: Secondary | ICD-10-CM | POA: Diagnosis not present

## 2020-04-06 DIAGNOSIS — Z9181 History of falling: Secondary | ICD-10-CM | POA: Diagnosis not present

## 2020-04-06 DIAGNOSIS — M1990 Unspecified osteoarthritis, unspecified site: Secondary | ICD-10-CM | POA: Diagnosis not present

## 2020-04-06 DIAGNOSIS — I4891 Unspecified atrial fibrillation: Secondary | ICD-10-CM | POA: Diagnosis not present

## 2020-04-06 DIAGNOSIS — M810 Age-related osteoporosis without current pathological fracture: Secondary | ICD-10-CM | POA: Diagnosis not present

## 2020-04-06 DIAGNOSIS — I130 Hypertensive heart and chronic kidney disease with heart failure and stage 1 through stage 4 chronic kidney disease, or unspecified chronic kidney disease: Secondary | ICD-10-CM | POA: Diagnosis not present

## 2020-04-06 DIAGNOSIS — L97513 Non-pressure chronic ulcer of other part of right foot with necrosis of muscle: Secondary | ICD-10-CM | POA: Diagnosis not present

## 2020-04-06 DIAGNOSIS — N1831 Chronic kidney disease, stage 3a: Secondary | ICD-10-CM | POA: Diagnosis not present

## 2020-04-06 DIAGNOSIS — Z792 Long term (current) use of antibiotics: Secondary | ICD-10-CM | POA: Diagnosis not present

## 2020-04-06 DIAGNOSIS — Z7982 Long term (current) use of aspirin: Secondary | ICD-10-CM | POA: Diagnosis not present

## 2020-04-06 DIAGNOSIS — I872 Venous insufficiency (chronic) (peripheral): Secondary | ICD-10-CM | POA: Diagnosis not present

## 2020-04-06 DIAGNOSIS — I739 Peripheral vascular disease, unspecified: Secondary | ICD-10-CM | POA: Diagnosis not present

## 2020-04-06 DIAGNOSIS — E7849 Other hyperlipidemia: Secondary | ICD-10-CM | POA: Diagnosis not present

## 2020-04-06 DIAGNOSIS — I2510 Atherosclerotic heart disease of native coronary artery without angina pectoris: Secondary | ICD-10-CM | POA: Diagnosis not present

## 2020-04-06 DIAGNOSIS — K219 Gastro-esophageal reflux disease without esophagitis: Secondary | ICD-10-CM | POA: Diagnosis not present

## 2020-04-06 DIAGNOSIS — E538 Deficiency of other specified B group vitamins: Secondary | ICD-10-CM | POA: Diagnosis not present

## 2020-04-06 DIAGNOSIS — I5032 Chronic diastolic (congestive) heart failure: Secondary | ICD-10-CM | POA: Diagnosis not present

## 2020-04-06 DIAGNOSIS — M5136 Other intervertebral disc degeneration, lumbar region: Secondary | ICD-10-CM | POA: Diagnosis not present

## 2020-04-06 DIAGNOSIS — I10 Essential (primary) hypertension: Secondary | ICD-10-CM | POA: Diagnosis not present

## 2020-04-06 DIAGNOSIS — Z7901 Long term (current) use of anticoagulants: Secondary | ICD-10-CM | POA: Diagnosis not present

## 2020-04-06 DIAGNOSIS — G2581 Restless legs syndrome: Secondary | ICD-10-CM | POA: Diagnosis not present

## 2020-04-06 DIAGNOSIS — E559 Vitamin D deficiency, unspecified: Secondary | ICD-10-CM | POA: Diagnosis not present

## 2020-04-06 DIAGNOSIS — G9009 Other idiopathic peripheral autonomic neuropathy: Secondary | ICD-10-CM | POA: Diagnosis not present

## 2020-04-06 DIAGNOSIS — I272 Pulmonary hypertension, unspecified: Secondary | ICD-10-CM | POA: Diagnosis not present

## 2020-04-06 NOTE — Telephone Encounter (Signed)
[facility] Last filled:  03/14/20, #60 Last OV:  03/22/20, 4 wk f/u Next OV:  06/21/20, AWV prt 2

## 2020-04-10 DIAGNOSIS — K219 Gastro-esophageal reflux disease without esophagitis: Secondary | ICD-10-CM | POA: Diagnosis not present

## 2020-04-10 DIAGNOSIS — I10 Essential (primary) hypertension: Secondary | ICD-10-CM | POA: Diagnosis not present

## 2020-04-10 DIAGNOSIS — E559 Vitamin D deficiency, unspecified: Secondary | ICD-10-CM | POA: Diagnosis not present

## 2020-04-10 DIAGNOSIS — I4891 Unspecified atrial fibrillation: Secondary | ICD-10-CM | POA: Diagnosis not present

## 2020-04-10 DIAGNOSIS — G2581 Restless legs syndrome: Secondary | ICD-10-CM | POA: Diagnosis not present

## 2020-04-10 DIAGNOSIS — I130 Hypertensive heart and chronic kidney disease with heart failure and stage 1 through stage 4 chronic kidney disease, or unspecified chronic kidney disease: Secondary | ICD-10-CM | POA: Diagnosis not present

## 2020-04-10 DIAGNOSIS — Z792 Long term (current) use of antibiotics: Secondary | ICD-10-CM | POA: Diagnosis not present

## 2020-04-10 DIAGNOSIS — S62102D Fracture of unspecified carpal bone, left wrist, subsequent encounter for fracture with routine healing: Secondary | ICD-10-CM | POA: Diagnosis not present

## 2020-04-10 DIAGNOSIS — L97513 Non-pressure chronic ulcer of other part of right foot with necrosis of muscle: Secondary | ICD-10-CM | POA: Diagnosis not present

## 2020-04-10 DIAGNOSIS — G9009 Other idiopathic peripheral autonomic neuropathy: Secondary | ICD-10-CM | POA: Diagnosis not present

## 2020-04-10 DIAGNOSIS — Z9181 History of falling: Secondary | ICD-10-CM | POA: Diagnosis not present

## 2020-04-10 DIAGNOSIS — R040 Epistaxis: Secondary | ICD-10-CM | POA: Diagnosis not present

## 2020-04-10 DIAGNOSIS — N1831 Chronic kidney disease, stage 3a: Secondary | ICD-10-CM | POA: Diagnosis not present

## 2020-04-10 DIAGNOSIS — M5136 Other intervertebral disc degeneration, lumbar region: Secondary | ICD-10-CM | POA: Diagnosis not present

## 2020-04-10 DIAGNOSIS — I2510 Atherosclerotic heart disease of native coronary artery without angina pectoris: Secondary | ICD-10-CM | POA: Diagnosis not present

## 2020-04-10 DIAGNOSIS — Z7901 Long term (current) use of anticoagulants: Secondary | ICD-10-CM | POA: Diagnosis not present

## 2020-04-10 DIAGNOSIS — M810 Age-related osteoporosis without current pathological fracture: Secondary | ICD-10-CM | POA: Diagnosis not present

## 2020-04-10 DIAGNOSIS — E7849 Other hyperlipidemia: Secondary | ICD-10-CM | POA: Diagnosis not present

## 2020-04-10 DIAGNOSIS — I872 Venous insufficiency (chronic) (peripheral): Secondary | ICD-10-CM | POA: Diagnosis not present

## 2020-04-10 DIAGNOSIS — I272 Pulmonary hypertension, unspecified: Secondary | ICD-10-CM | POA: Diagnosis not present

## 2020-04-10 DIAGNOSIS — I739 Peripheral vascular disease, unspecified: Secondary | ICD-10-CM | POA: Diagnosis not present

## 2020-04-10 DIAGNOSIS — H60331 Swimmer's ear, right ear: Secondary | ICD-10-CM | POA: Diagnosis not present

## 2020-04-10 DIAGNOSIS — Z7982 Long term (current) use of aspirin: Secondary | ICD-10-CM | POA: Diagnosis not present

## 2020-04-10 DIAGNOSIS — E538 Deficiency of other specified B group vitamins: Secondary | ICD-10-CM | POA: Diagnosis not present

## 2020-04-10 DIAGNOSIS — I5032 Chronic diastolic (congestive) heart failure: Secondary | ICD-10-CM | POA: Diagnosis not present

## 2020-04-10 DIAGNOSIS — M1990 Unspecified osteoarthritis, unspecified site: Secondary | ICD-10-CM | POA: Diagnosis not present

## 2020-04-11 DIAGNOSIS — E538 Deficiency of other specified B group vitamins: Secondary | ICD-10-CM | POA: Diagnosis not present

## 2020-04-11 DIAGNOSIS — L97513 Non-pressure chronic ulcer of other part of right foot with necrosis of muscle: Secondary | ICD-10-CM | POA: Diagnosis not present

## 2020-04-11 DIAGNOSIS — Z7982 Long term (current) use of aspirin: Secondary | ICD-10-CM | POA: Diagnosis not present

## 2020-04-11 DIAGNOSIS — M810 Age-related osteoporosis without current pathological fracture: Secondary | ICD-10-CM | POA: Diagnosis not present

## 2020-04-11 DIAGNOSIS — Z9181 History of falling: Secondary | ICD-10-CM | POA: Diagnosis not present

## 2020-04-11 DIAGNOSIS — S62102D Fracture of unspecified carpal bone, left wrist, subsequent encounter for fracture with routine healing: Secondary | ICD-10-CM | POA: Diagnosis not present

## 2020-04-11 DIAGNOSIS — I5032 Chronic diastolic (congestive) heart failure: Secondary | ICD-10-CM | POA: Diagnosis not present

## 2020-04-11 DIAGNOSIS — I872 Venous insufficiency (chronic) (peripheral): Secondary | ICD-10-CM | POA: Diagnosis not present

## 2020-04-11 DIAGNOSIS — N1831 Chronic kidney disease, stage 3a: Secondary | ICD-10-CM | POA: Diagnosis not present

## 2020-04-11 DIAGNOSIS — I739 Peripheral vascular disease, unspecified: Secondary | ICD-10-CM | POA: Diagnosis not present

## 2020-04-11 DIAGNOSIS — I2510 Atherosclerotic heart disease of native coronary artery without angina pectoris: Secondary | ICD-10-CM | POA: Diagnosis not present

## 2020-04-11 DIAGNOSIS — G2581 Restless legs syndrome: Secondary | ICD-10-CM | POA: Diagnosis not present

## 2020-04-11 DIAGNOSIS — Z7901 Long term (current) use of anticoagulants: Secondary | ICD-10-CM | POA: Diagnosis not present

## 2020-04-11 DIAGNOSIS — M5136 Other intervertebral disc degeneration, lumbar region: Secondary | ICD-10-CM | POA: Diagnosis not present

## 2020-04-11 DIAGNOSIS — Z792 Long term (current) use of antibiotics: Secondary | ICD-10-CM | POA: Diagnosis not present

## 2020-04-11 DIAGNOSIS — E7849 Other hyperlipidemia: Secondary | ICD-10-CM | POA: Diagnosis not present

## 2020-04-11 DIAGNOSIS — I4891 Unspecified atrial fibrillation: Secondary | ICD-10-CM | POA: Diagnosis not present

## 2020-04-11 DIAGNOSIS — G9009 Other idiopathic peripheral autonomic neuropathy: Secondary | ICD-10-CM | POA: Diagnosis not present

## 2020-04-11 DIAGNOSIS — M1990 Unspecified osteoarthritis, unspecified site: Secondary | ICD-10-CM | POA: Diagnosis not present

## 2020-04-11 DIAGNOSIS — I272 Pulmonary hypertension, unspecified: Secondary | ICD-10-CM | POA: Diagnosis not present

## 2020-04-11 DIAGNOSIS — I130 Hypertensive heart and chronic kidney disease with heart failure and stage 1 through stage 4 chronic kidney disease, or unspecified chronic kidney disease: Secondary | ICD-10-CM | POA: Diagnosis not present

## 2020-04-11 DIAGNOSIS — I10 Essential (primary) hypertension: Secondary | ICD-10-CM | POA: Diagnosis not present

## 2020-04-11 DIAGNOSIS — K219 Gastro-esophageal reflux disease without esophagitis: Secondary | ICD-10-CM | POA: Diagnosis not present

## 2020-04-11 DIAGNOSIS — E559 Vitamin D deficiency, unspecified: Secondary | ICD-10-CM | POA: Diagnosis not present

## 2020-04-12 DIAGNOSIS — L97513 Non-pressure chronic ulcer of other part of right foot with necrosis of muscle: Secondary | ICD-10-CM | POA: Diagnosis not present

## 2020-04-12 DIAGNOSIS — M1990 Unspecified osteoarthritis, unspecified site: Secondary | ICD-10-CM | POA: Diagnosis not present

## 2020-04-12 DIAGNOSIS — E7849 Other hyperlipidemia: Secondary | ICD-10-CM | POA: Diagnosis not present

## 2020-04-12 DIAGNOSIS — K219 Gastro-esophageal reflux disease without esophagitis: Secondary | ICD-10-CM | POA: Diagnosis not present

## 2020-04-12 DIAGNOSIS — I130 Hypertensive heart and chronic kidney disease with heart failure and stage 1 through stage 4 chronic kidney disease, or unspecified chronic kidney disease: Secondary | ICD-10-CM | POA: Diagnosis not present

## 2020-04-12 DIAGNOSIS — Z7901 Long term (current) use of anticoagulants: Secondary | ICD-10-CM | POA: Diagnosis not present

## 2020-04-12 DIAGNOSIS — E538 Deficiency of other specified B group vitamins: Secondary | ICD-10-CM | POA: Diagnosis not present

## 2020-04-12 DIAGNOSIS — G2581 Restless legs syndrome: Secondary | ICD-10-CM | POA: Diagnosis not present

## 2020-04-12 DIAGNOSIS — I739 Peripheral vascular disease, unspecified: Secondary | ICD-10-CM | POA: Diagnosis not present

## 2020-04-12 DIAGNOSIS — N1831 Chronic kidney disease, stage 3a: Secondary | ICD-10-CM | POA: Diagnosis not present

## 2020-04-12 DIAGNOSIS — I272 Pulmonary hypertension, unspecified: Secondary | ICD-10-CM | POA: Diagnosis not present

## 2020-04-12 DIAGNOSIS — Z792 Long term (current) use of antibiotics: Secondary | ICD-10-CM | POA: Diagnosis not present

## 2020-04-12 DIAGNOSIS — Z9181 History of falling: Secondary | ICD-10-CM | POA: Diagnosis not present

## 2020-04-12 DIAGNOSIS — S62102D Fracture of unspecified carpal bone, left wrist, subsequent encounter for fracture with routine healing: Secondary | ICD-10-CM | POA: Diagnosis not present

## 2020-04-12 DIAGNOSIS — M5136 Other intervertebral disc degeneration, lumbar region: Secondary | ICD-10-CM | POA: Diagnosis not present

## 2020-04-12 DIAGNOSIS — I10 Essential (primary) hypertension: Secondary | ICD-10-CM | POA: Diagnosis not present

## 2020-04-12 DIAGNOSIS — I2510 Atherosclerotic heart disease of native coronary artery without angina pectoris: Secondary | ICD-10-CM | POA: Diagnosis not present

## 2020-04-12 DIAGNOSIS — M810 Age-related osteoporosis without current pathological fracture: Secondary | ICD-10-CM | POA: Diagnosis not present

## 2020-04-12 DIAGNOSIS — I5032 Chronic diastolic (congestive) heart failure: Secondary | ICD-10-CM | POA: Diagnosis not present

## 2020-04-12 DIAGNOSIS — E559 Vitamin D deficiency, unspecified: Secondary | ICD-10-CM | POA: Diagnosis not present

## 2020-04-12 DIAGNOSIS — Z7982 Long term (current) use of aspirin: Secondary | ICD-10-CM | POA: Diagnosis not present

## 2020-04-12 DIAGNOSIS — I872 Venous insufficiency (chronic) (peripheral): Secondary | ICD-10-CM | POA: Diagnosis not present

## 2020-04-12 DIAGNOSIS — G9009 Other idiopathic peripheral autonomic neuropathy: Secondary | ICD-10-CM | POA: Diagnosis not present

## 2020-04-12 DIAGNOSIS — I4891 Unspecified atrial fibrillation: Secondary | ICD-10-CM | POA: Diagnosis not present

## 2020-04-13 ENCOUNTER — Encounter: Payer: Medicare Other | Attending: Physician Assistant | Admitting: Physician Assistant

## 2020-04-13 ENCOUNTER — Other Ambulatory Visit: Payer: Self-pay

## 2020-04-13 DIAGNOSIS — S41102A Unspecified open wound of left upper arm, initial encounter: Secondary | ICD-10-CM | POA: Insufficient documentation

## 2020-04-13 DIAGNOSIS — L97512 Non-pressure chronic ulcer of other part of right foot with fat layer exposed: Secondary | ICD-10-CM | POA: Insufficient documentation

## 2020-04-13 DIAGNOSIS — I480 Paroxysmal atrial fibrillation: Secondary | ICD-10-CM | POA: Insufficient documentation

## 2020-04-13 DIAGNOSIS — Z7901 Long term (current) use of anticoagulants: Secondary | ICD-10-CM | POA: Diagnosis not present

## 2020-04-13 DIAGNOSIS — W19XXXA Unspecified fall, initial encounter: Secondary | ICD-10-CM | POA: Insufficient documentation

## 2020-04-13 DIAGNOSIS — I739 Peripheral vascular disease, unspecified: Secondary | ICD-10-CM | POA: Diagnosis not present

## 2020-04-13 NOTE — Progress Notes (Addendum)
NAVEEN, VERDUN ([id number]) Visit Report for 04/13/2020 Arrival Information Details Patient Name: [name], [name]. Date of Service: 04/13/2020 2:45 PM Medical Record Number: [id number] Patient Account Number: [id number] Date of Birth/Sex: 25-Jun-1929 (87 y.o. F) Treating RN: Cornell Barman Primary Care Michela Herst: Ria Bush Other Clinician: Referring Agustine Rossitto: Ria Bush Treating Japleen Tornow/Extender: Skipper Cliche in Treatment: 26 Visit Information History Since Last Visit Added or deleted any medications: No Patient Arrived: Gilford Rile Any new allergies or adverse reactions: No Arrival Time: 15:02 Had a fall or experienced change in No Accompanied By: daughter activities of daily living that may affect Transfer Assistance: None risk of falls: Patient Identification Verified: Yes Signs or symptoms of abuse/neglect since last visito No Secondary Verification Process Completed: Yes Hospitalized since last visit: No Patient Requires Transmission-Based No Implantable device outside of the clinic excluding No Precautions: cellular tissue based products placed in the center Patient Has Alerts: Yes since last visit: Patient Alerts: 08/31/19 ABI R)1.3 L) Has Dressing in Place as Prescribed: Yes 1.31 Pain Present Now: Yes 08/31/19 TBI R).61 L).58 Electronic Signature(s) Signed: 04/13/2020 4:51:21 PM By: Lorine Bears RCP, RRT, CHT Entered By: Lorine Bears on 04/13/2020 15:04:01 MAURA, HIOTT ([id number]) -------------------------------------------------------------------------------- Clinic Level of Care Assessment Details Patient Name: [name], [name]. Date of Service: 04/13/2020 2:45 PM Medical Record Number: [id number] Patient Account Number: [id number] Date of Birth/Sex: [birth year] (87 y.o. F) Treating RN: Carlene Coria Primary Care Kateria Cutrona: Ria Bush Other Clinician: Referring Marithza Malachi: Ria Bush Treating Dajanae Brophy/Extender:  Skipper Cliche in Treatment: 26 Clinic Level of Care Assessment Items TOOL 4 Quantity Score X - Use when only an EandM is performed on FOLLOW-UP visit 1 0 ASSESSMENTS - Nursing Assessment / Reassessment X - Reassessment of Co-morbidities (includes updates in patient status) 1 10 X- 1 5 Reassessment of Adherence to Treatment Plan ASSESSMENTS - Wound and Skin Assessment / Reassessment X - Simple Wound Assessment / Reassessment - one wound 1 5 []  - 0 Complex Wound Assessment / Reassessment - multiple wounds []  - 0 Dermatologic / Skin Assessment (not related to wound area) ASSESSMENTS - Focused Assessment []  - Circumferential Edema Measurements - multi extremities 0 []  - 0 Nutritional Assessment / Counseling / Intervention []  - 0 Lower Extremity Assessment (monofilament, tuning fork, pulses) []  - 0 Peripheral Arterial Disease Assessment (using hand held doppler) ASSESSMENTS - Ostomy and/or Continence Assessment and Care []  - Incontinence Assessment and Management 0 []  - 0 Ostomy Care Assessment and Management (repouching, etc.) PROCESS - Coordination of Care X - Simple Patient / Family Education for ongoing care 1 15 []  - 0 Complex (extensive) Patient / Family Education for ongoing care []  - 0 Staff obtains Programmer, systems, Records, Test Results / Process Orders []  - 0 Staff telephones HHA, Nursing Homes / Clarify orders / etc []  - 0 Routine Transfer to another Facility (non-emergent condition) []  - 0 Routine Hospital Admission (non-emergent condition) []  - 0 New Admissions / Biomedical engineer / Ordering NPWT, Apligraf, etc. []  - 0 Emergency Hospital Admission (emergent condition) X- 1 10 Simple Discharge Coordination []  - 0 Complex (extensive) Discharge Coordination PROCESS - Special Needs []  - Pediatric / Minor Patient Management 0 []  - 0 Isolation Patient Management []  - 0 Hearing / Language / Visual special needs []  - 0 Assessment of Community assistance  (transportation, D/C planning, etc.) []  - 0 Additional assistance / Altered mentation []  - 0 Support Surface(s) Assessment (bed, cushion, seat, etc.) INTERVENTIONS - Wound Cleansing / Measurement [name], Crystal S. ([id number])  X- 1 5 Simple Wound Cleansing - one wound []  - 0 Complex Wound Cleansing - multiple wounds []  - 0 Wound Imaging (photographs - any number of wounds) X- 1 5 Wound Tracing (instead of photographs) []  - 0 Simple Wound Measurement - one wound []  - 0 Complex Wound Measurement - multiple wounds INTERVENTIONS - Wound Dressings X - Small Wound Dressing one or multiple wounds 1 10 []  - 0 Medium Wound Dressing one or multiple wounds []  - 0 Large Wound Dressing one or multiple wounds X- 1 5 Application of Medications - topical []  - 0 Application of Medications - injection INTERVENTIONS - Miscellaneous []  - External ear exam 0 []  - 0 Specimen Collection (cultures, biopsies, blood, body fluids, etc.) []  - 0 Specimen(s) / Culture(s) sent or taken to Lab for analysis []  - 0 Patient Transfer (multiple staff / Civil Service fast streamer / Similar devices) []  - 0 Simple Staple / Suture removal (25 or less) []  - 0 Complex Staple / Suture removal (26 or more) []  - 0 Hypo / Hyperglycemic Management (close monitor of Blood Glucose) []  - 0 Ankle / Brachial Index (ABI) - do not check if billed separately X- 1 5 Vital Signs Has the patient been seen at the hospital within the last three years: Yes Total Score: 75 Level Of Care: New/Established - Level 2 Electronic Signature(s) Signed: 04/14/2020 4:22:48 PM By: Carlene Coria RN Entered By: Carlene Coria on 04/13/2020 15:43:24 [name] ([id number]) -------------------------------------------------------------------------------- Encounter Discharge Information Details Patient Name: [name]. Date of Service: 04/13/2020 2:45 PM Medical Record Number: [id number] Patient Account Number: [id number] Date of Birth/Sex:  April 02, 1930 (87 y.o. F) Treating RN: Carlene Coria Primary Care Paulette Lynch: Ria Bush Other Clinician: Referring Erven Ramson: Ria Bush Treating Dashel Goines/Extender: Skipper Cliche in Treatment: 26 Encounter Discharge Information Items Discharge Condition: Stable Ambulatory Status: Walker Discharge Destination: Home Transportation: Private Auto Accompanied By: caregiver Schedule Follow-up Appointment: Yes Clinical Summary of Care: Patient Declined Electronic Signature(s) Signed: 04/14/2020 4:22:48 PM By: Carlene Coria RN Entered By: Carlene Coria on 04/13/2020 15:45:10 [name] ([id number]) -------------------------------------------------------------------------------- Multi Wound Chart Details Patient Name: [name] Date of Service: 04/13/2020 2:45 PM Medical Record Number: [id number] Patient Account Number: [id number] Date of Birth/Sex: 12-May-1929 (87 y.o. F) Treating RN: Carlene Coria Primary Care Ibrahima Holberg: Ria Bush Other Clinician: Referring Cleon Thoma: Ria Bush Treating Ainsley Deakins/Extender: Skipper Cliche in Treatment: 26 Vital Signs Height(in): 63 Pulse(bpm): 71 Weight(lbs): 140 Blood Pressure(mmHg): 182/72 Body Mass Index(BMI): 26 Temperature(F): 98.0 Respiratory Rate(breaths/min): 16 Photos: [N/A:N/A] Wound Location: Right Metatarsal head fifth N/A N/A Wounding Event: Gradually Appeared N/A N/A Primary Etiology: Arterial Insufficiency Ulcer N/A N/A Comorbid History: Arrhythmia, Osteoarthritis N/A N/A Date Acquired: 04/09/2019 N/A N/A Weeks of Treatment: 26 N/A N/A Wound Status: Open N/A N/A Measurements L x W x D (cm) 1.1x0.7x0.4 N/A N/A Area (cm) : 0.605 N/A N/A Volume (cm) : 0.242 N/A N/A % Reduction in Area: 46.50% N/A N/A % Reduction in Volume: -114.20% N/A N/A Classification: Full Thickness Without Exposed N/A N/A Support Structures Exudate Amount: Medium N/A N/A Exudate Type: Serosanguineous N/A N/A Exudate  Color: red, brown N/A N/A Wound Margin: Distinct, outline attached N/A N/A Granulation Amount: Large (67-100%) N/A N/A Granulation Quality: Red, Pink, Hyper-granulation N/A N/A Necrotic Amount: Small (1-33%) N/A N/A Exposed Structures: Fat Layer (Subcutaneous Tissue): N/A N/A Yes Fascia: No Tendon: No Muscle: No Joint: No Bone: No Epithelialization: None N/A N/A Treatment Notes Electronic Signature(s) Signed: 04/14/2020 4:22:48 PM By: Carlene Coria RN Entered By:  Carlene Coria on 04/13/2020 15:33:45 [name], [name] ([id number]) -------------------------------------------------------------------------------- [place] Details Patient Name: [name], [name]. Date of Service: 04/13/2020 2:45 PM Medical Record Number: [id number] Patient Account Number: [id number] Date of Birth/Sex: [birth year] (87 y.o. F) Treating RN: Carlene Coria Primary Care Shali Vesey: Ria Bush Other Clinician: Referring Danija Gosa: Ria Bush Treating Bryleigh Ottaway/Extender: Skipper Cliche in Treatment: 26 Active Inactive Abuse / Safety / Falls / Self Care Management Nursing Diagnoses: Potential for falls Goals: Patient/caregiver will verbalize understanding of skin care regimen Date Initiated: 10/12/2019 Target Resolution Date: 05/12/2020 Goal Status: Active Interventions: Assess fall risk on admission and as needed Notes: Electronic Signature(s) Signed: 04/14/2020 4:22:48 PM By: Carlene Coria RN Entered By: Carlene Coria on 04/13/2020 15:33:29 [name] ([id number]) -------------------------------------------------------------------------------- Pain Assessment Details Patient Name: [name]. Date of Service: 04/13/2020 2:45 PM Medical Record Number: [id number] Patient Account Number: [id number] Date of Birth/Sex: Jul 31, 1929 (87 y.o. F) Treating RN: Carlene Coria Primary Care Felicidad Sugarman: Ria Bush Other Clinician: Referring Makenly Larabee: Ria Bush Treating  Clair Bardwell/Extender: Skipper Cliche in Treatment: 26 Active Problems Location of Pain Severity and Description of Pain Patient Has Paino No Site Locations Pain Management and Medication Current Pain Management: Electronic Signature(s) Signed: 04/14/2020 4:22:48 PM By: Carlene Coria RN Entered By: Carlene Coria on 04/13/2020 15:14:06 [name] ([id number]) -------------------------------------------------------------------------------- Patient/Caregiver Education Details Patient Name: [name] Date of Service: 04/13/2020 2:45 PM Medical Record Number: [id number] Patient Account Number: [id number] Date of Birth/Gender: January 02, 1930 (87 y.o. F) Treating RN: Carlene Coria Primary Care Physician: Ria Bush Other Clinician: Referring Physician: Ria Bush Treating Physician/Extender: Skipper Cliche in Treatment: 26 Education Assessment Education Provided To: Patient Education Topics Provided Wound/Skin Impairment: Methods: Explain/Verbal Responses: State content correctly Electronic Signature(s) Signed: 04/14/2020 4:22:48 PM By: Carlene Coria RN Entered By: Carlene Coria on 04/13/2020 15:43:52 [name] ([id number]) -------------------------------------------------------------------------------- Wound Assessment Details Patient Name: [name] Date of Service: 04/13/2020 2:45 PM Medical Record Number: [id number] Patient Account Number: [id number] Date of Birth/Sex: 11-24-1929 (87 y.o. F) Treating RN: Carlene Coria Primary Care Cooper Stamp: Ria Bush Other Clinician: Referring Kristianna Saperstein: Ria Bush Treating Mayo Owczarzak/Extender: Skipper Cliche in Treatment: 26 Wound Status Wound Number: 3 Primary Etiology: Arterial Insufficiency Ulcer Wound Location: Right Metatarsal head fifth Wound Status: Open Wounding Event: Gradually Appeared Comorbid History: Arrhythmia, Osteoarthritis Date Acquired: 04/09/2019 Weeks Of Treatment:  26 Clustered Wound: No Photos Wound Measurements Length: (cm) 1.1 Width: (cm) 0.7 Depth: (cm) 0.4 Area: (cm) 0.605 Volume: (cm) 0.242 % Reduction in Area: 46.5% % Reduction in Volume: -114.2% Epithelialization: None Tunneling: No Undermining: No Wound Description Classification: Full Thickness Without Exposed Support Structures Wound Margin: Distinct, outline attached Exudate Amount: Medium Exudate Type: Serosanguineous Exudate Color: red, brown Foul Odor After Cleansing: No Slough/Fibrino Yes Wound Bed Granulation Amount: Large (67-100%) Exposed Structure Granulation Quality: Red, Pink, Hyper-granulation Fascia Exposed: No Necrotic Amount: Small (1-33%) Fat Layer (Subcutaneous Tissue) Exposed: Yes Necrotic Quality: Adherent Slough Tendon Exposed: No Muscle Exposed: No Joint Exposed: No Bone Exposed: No Treatment Notes Wound #3 (Metatarsal head fifth) Wound Laterality: Right Cleanser Soap and Water Discharge Instruction: Gently cleanse wound with antibacterial soap, rinse and pat dry prior to dressing wounds Peri-Wound Care [name], [name] ([id number]) Topical Primary Dressing Prisma 4.34 (in) Discharge Instruction: Moisten w/normal saline or sterile water; Cover wound as directed. Do not remove from wound bed. Secondary Dressing Secured With Compression Wrap Compression Stockings Add-Ons Bordered Gauze Sterile-HBD 4x4 (in/in) Discharge Instruction: Cover wound with Bordered Guaze Sterile as directed  Electronic Signature(s) Signed: 04/14/2020 4:22:48 PM By: Yevonne Pax RN Entered By: Yevonne Pax on 04/13/2020 15:14:56 [name] ([id number]) -------------------------------------------------------------------------------- Vitals Details Patient Name: [name] Date of Service: 04/13/2020 2:45 PM Medical Record Number: [id number] Patient Account Number: [id number] Date of Birth/Sex: April 04, 1930 (87 y.o. F) Treating RN: Huel Coventry Primary Care  Takaya Hyslop: Eustaquio Boyden Other Clinician: Referring Lyrika Souders: Eustaquio Boyden Treating Elmer Boutelle/Extender: Rowan Blase in Treatment: 26 Vital Signs Time Taken: 15:00 Temperature (F): 98.0 Height (in): 62 Pulse (bpm): 70 Weight (lbs): 140 Respiratory Rate (breaths/min): 16 Body Mass Index (BMI): 25.6 Blood Pressure (mmHg): 182/72 Reference Range: 80 - 120 mg / dl Electronic Signature(s) Signed: 04/13/2020 4:51:21 PM By: Dayton Martes RCP, RRT, CHT Entered By: Dayton Martes on 04/13/2020 15:05:31

## 2020-04-13 NOTE — Progress Notes (Addendum)
[name] ([id number]) Visit Report for 04/13/2020 Chief Complaint Document Details Patient Name: [name], [name]. Date of Service: 04/13/2020 2:45 PM Medical Record Number: [id number] Patient Account Number: [id number] Date of Birth/Sex: Mar 04, 1930 (87 y.o. F) Treating RN: Huel Coventry Primary Care Provider: Eustaquio Boyden Other Clinician: Referring Provider: Eustaquio Boyden Treating Provider/Extender: Rowan Blase in Treatment: 26 Information Obtained from: Patient Chief Complaint Right Lateral Foot Ulcer Electronic Signature(s) Signed: 04/13/2020 2:58:58 PM By: Lenda Kelp PA-C Entered By: Lenda Kelp on 04/13/2020 14:58:57 [name] ([id number]) -------------------------------------------------------------------------------- HPI Details Patient Name: [name] Date of Service: 04/13/2020 2:45 PM Medical Record Number: [id number] Patient Account Number: [id number] Date of Birth/Sex: 1930-02-29 (87 y.o. F) Treating RN: Huel Coventry Primary Care Provider: Eustaquio Boyden Other Clinician: Referring Provider: Eustaquio Boyden Treating Provider/Extender: Rowan Blase in Treatment: 26 History of Present Illness HPI Description: 10/12/2019 upon evaluation today patient appears to be doing somewhat poorly in regard to her wound at this point. She has a ulcer over the right fifth metatarsal region. This has been managed by podiatry up to this point. Unfortunately the patient just does not seem to be making the progress that they were hoping for an subsequently the patient was referred to [place] by [facility] Dr. Allena Katz. With that being said I did note that the patient does have peripheral vascular disease which has been managed by Dr. Drucilla Schmidt her most recent angiogram with intervention was on 08/02/2019. The ABIs following were on the right 1.30 with a TBI of 0.61 normal left 1.31 with a TBI of 0.58. Obviously things seem to be doing somewhat better  at this point hopefully enough to allow her to heal. She is on anticoagulant therapy due to atrial fibrillation long-term. With that being said the patient is unfortunately having quite a bit of discomfort at this time secondary to the wound. She also has erythema which is very likely secondary to infection. I am going obtain a culture of the area today once debridement is complete. Apparently the patient also had a x-ray that was performed by Dr. Allena Katz on 09/21/2019 and this showed that the patient had no obvious signs on x-ray of cortical irregularity and no osteomyelitis obviously noted. With that being said as I discussed with the patient's daughter as well this does not indicate that the patient absolutely has no osteomyelitis as x-rays are only at best 50% helpful in identifying this complication. Nonetheless I do believe that we need to have the patient continue to have this further evaluated specifically I am to recommend that we check into an MRI which I think would be ideal to evaluate whether or not there is osteomyelitis present. 7/16; this is a patient with an ulcer over the right fifth metatarsal head. She [facility] with [facility] She is due to have an MRI of the foot on 7/26. Using Santyl to the wound 11/04/2019 on evaluation today patient appears to be doing okay in regard to her wound at this time. Unfortunately since I last saw her which was actually her second visit here in the clinic she has moved into a new apartment and then subsequently had a fall. She ended up in the hospital she did fracture her left arm and subsequently has also been dealing with a lot of dizziness she has been at [employer]. She tells me that getting in the change the dressing has been somewhat difficult as far as daily dressing  changes are concerned with the Santyl. Nonetheless when they do change it that is what is been used and her daughter-in-law has also been coming in to help  out as well with the dressing changes to try to make sure it gets done daily. Fortunately there is no signs of active infection at this time. No fever chills noted. I do believe the Santyl has been beneficial and is likely still the best way to go at this point for the patient. I did review the patient's MRI which showed some potential for reactive osteitis but did not show any signs of osteomyelitis overtly. That cannot be excluded but again right now it does not appear that is very likely present as well. This is good news. I also did review her culture she has been on the Bactrim she tells me she has taken this and again that was appropriate based on the results of her culture. 11/19/2019 upon evaluation today patient appears to be doing decently well in regard to her wound. Fortunately there is no signs of active infection at this time wound is not significantly larger in fact is roughly about the same at this point. With that being said there is not as much erythema as previously noted I think the infection seems to be under much better control which is great news. Unfortunately she has fallen since I last saw her and broken her left arm she has an appointment with them following the appointment with me. 8/25; patient was worked in acutely today at the request of home health and was concerned about her right heel, generalized erythema in her feet bilaterally. According to her daughter things have been getting worse over the last for 5 days but not precisely from a wound care point of view. Her wound is on the lateral part of the right fifth metatarsal head looks about the same as I remember this. She has developed dryness and flaking of the skin on her plantar feet. As well a very tender area localized over the insertion point of the Achilles tendon. She has restless leg syndrome. Does not specifically offload the area at night. She wears socks at home and does not really give me a pressure on the  right heel issue. I reviewed her vascular status. She underwent her last revascularization on 08/02/2019 by Dr. Wyn Quaker. This included an angioplasty of the right peroneal artery as well as the right posterior tibial artery. Her last noninvasive arterial study was on 11/23/2019. On the right her ABI was 1.08 TBI of 0.48. On the left ABI at 1.21 with a TBI of 0.54. Family relates that Dr. Wyn Quaker felt that he had done the best he could and this. We have been using Santyl to the one wound she has on the right lateral fifth metatarsal head. She does not have a new wound today which is the concern that letter coming into the clinic 12/09/2019 on evaluation today patient appears to be doing decently well in regard to her foot ulcer at this point. She does note that with the compression wrap that the home health nurse put on after contacting our clinic that she actually did much better and felt much better. Her heel is also improved. Fortunately there is no signs of active infection at this time. No fevers, chills, nausea, vomiting, or diarrhea. 12/24/2019 upon evaluation today patient appears to be doing somewhat poorly still in regard to her foot. Unfortunately this is just not showing the signs of improvement that I  would like to see it is a little bit cleaner but also appears to be erythematous and I believe there is infection noted at this point. We will get a need to address this in my opinion. I did actually recommend that we switch back to the Iodoflex as well apparently the home health nurse had switch this away that not really what we want to do at this point in my opinion. They were using silver alginate I think most recently. 01/06/2020 upon evaluation today patient actually appears to be doing much better in regard to her wound. We have switched back to the silver alginate after her home health nurse contacted me and honestly I feel like she is doing quite well in that regard. She does have a lot of  drainage and therefore I think that is probably a better option for. With that being said she is tolerating the compression wrap without complication that also seems to be helping. 01/28/2020 on evaluation today patient appears to be doing well with regard to her foot ulcer. I do feel like she is showing signs of improvement which is great news. There is no signs of active infection at this time which is also good news. No fevers, chills, nausea, vomiting, or diarrhea. Unfortunately she did have a fall and sustained an injury to the left upper arm. She has a skin tear here this appears to be doing okay although WALSIE, REEN. ([id number]) the dressing was stuck to this this was an alginate dressing. I really think she probably needs something like Xeroform to try to prevent anything from sticking to this region. Other than that she seems to be doing quite well. 02/11/2020 on evaluation today patient's arm skin tear appears to be completely healed which is great. Unfortunately she is still having issues at this time with her foot although this seems to be showing signs of improvement it still very slow. There is no signs of active infection at this time. 03/06/2020 upon evaluation today patient appears to be doing decently well in regard to her foot ulcer. There is some minimal slough noted on the surface of the wound building up at this point. Otherwise I really feel like she is doing quite nicely with quite a bit of new granulation and epithelial growth. 03/23/2020 upon evaluation today patient appears to be doing well with regards to her wound. She has been tolerating the dressing changes without complication. Fortunately there is no evidence of active infection and overall feel like her foot ulcer is doing excellent at this point. 04/13/2020 on evaluation today patient appears to be doing well with regard to her foot ulcer. She has been tolerating the dressing changes without complication.  Fortunately there is no signs of active infection at this time. Overall I feel like the wound is getting close to complete closure and this appears to be doing quite well in my opinion. Electronic Signature(s) Signed: 04/13/2020 3:54:57 PM By: Lenda Kelp PA-C Entered By: Lenda Kelp on 04/13/2020 15:54:57 [name] ([id number]) -------------------------------------------------------------------------------- Physical Exam Details Patient Name: MARIPOSA, ARTHURS. Date of Service: 04/13/2020 2:45 PM Medical Record Number: [id number] Patient Account Number: [id number] Date of Birth/Sex: 1929/11/23 (87 y.o. F) Treating RN: Huel Coventry Primary Care Provider: Eustaquio Boyden Other Clinician: Referring Provider: Eustaquio Boyden Treating Provider/Extender: Rowan Blase in Treatment: 26 Constitutional Well-nourished and well-hydrated in no acute distress. Respiratory normal breathing without difficulty. Psychiatric this patient is able to make decisions and demonstrates good insight into  disease process. Alert and Oriented x 3. pleasant and cooperative. Notes Upon inspection patient's wound bed showed signs again of good granulation and epithelization there is no evidence of active infection and I do believe she is making great progress. The wound is very close to closing. Electronic Signature(s) Signed: 04/13/2020 3:55:13 PM By: Lenda Kelp PA-C Entered By: Lenda Kelp on 04/13/2020 15:55:13 [name] ([id number]) -------------------------------------------------------------------------------- Physician Orders Details Patient Name: [name] Date of Service: 04/13/2020 2:45 PM Medical Record Number: [id number] Patient Account Number: [id number] Date of Birth/Sex: [birth year] (87 y.o. F) Treating RN: Yevonne Pax Primary Care Provider: Eustaquio Boyden Other Clinician: Referring Provider: Eustaquio Boyden Treating Provider/Extender: Rowan Blase  in Treatment: 26 Verbal / Phone Orders: No Diagnosis Coding ICD-10 Coding Code Description I73.89 Other specified peripheral vascular diseases L97.512 Non-pressure chronic ulcer of other part of right foot with fat layer exposed S41.102A Unspecified open wound of left upper arm, initial encounter Z79.01 Long term (current) use of anticoagulants I48.0 Paroxysmal atrial fibrillation B35.3 Tinea pedis Follow-up Appointments o Return Appointment in 2 weeks. Home Health o Continue Home Health for wound care. May utilize formulary equivalent dressing for wound treatment orders unless otherwise specified. Home Health Nurse may visit PRN to address patientos wound care needs. [facility] Wound Treatment Wound #3 - Metatarsal head fifth Wound Laterality: Right Cleanser: Soap and Water 2 x Per Week Discharge Instructions: Gently cleanse wound with antibacterial soap, rinse and pat dry prior to dressing wounds Primary Dressing: Prisma 4.34 (in) 2 x Per Week Discharge Instructions: Moisten w/normal saline or sterile water; Cover wound as directed. Do not remove from wound bed. Add-Ons: Bordered Gauze Sterile-HBD 4x4 (in/in) 2 x Per Week Discharge Instructions: Cover wound with Bordered Guaze Sterile as directed Electronic Signature(s) Signed: 04/14/2020 4:17:09 PM By: Lenda Kelp PA-C Signed: 04/14/2020 4:22:48 PM By: Yevonne Pax RN Entered By: Yevonne Pax on 04/13/2020 15:37:36 KAYSEE, TREVITHICK ([id number]) -------------------------------------------------------------------------------- Problem List Details Patient Name: JASNOOR, KIN. Date of Service: 04/13/2020 2:45 PM Medical Record Number: [id number] Patient Account Number: [id number] Date of Birth/Sex: [birth year] (87 y.o. F) Treating RN: Huel Coventry Primary Care Provider: Eustaquio Boyden Other Clinician: Referring Provider: Eustaquio Boyden Treating Provider/Extender: Rowan Blase in Treatment: 26 Active  Problems ICD-10 Encounter Code Description Active Date MDM Diagnosis I73.89 Other specified peripheral vascular diseases 10/12/2019 No Yes L97.512 Non-pressure chronic ulcer of other part of right foot with fat layer 10/12/2019 No Yes exposed S41.102A Unspecified open wound of left upper arm, initial encounter 01/28/2020 No Yes Z79.01 Long term (current) use of anticoagulants 10/12/2019 No Yes I48.0 Paroxysmal atrial fibrillation 10/12/2019 No Yes B35.3 Tinea pedis 12/01/2019 No Yes Inactive Problems Resolved Problems Electronic Signature(s) Signed: 04/13/2020 2:58:49 PM By: Lenda Kelp PA-C Entered By: Lenda Kelp on 04/13/2020 14:58:48 [name] ([id number]) -------------------------------------------------------------------------------- Progress Note Details Patient Name: [name]. Date of Service: 04/13/2020 2:45 PM Medical Record Number: [id number] Patient Account Number: [id number] Date of Birth/Sex: June 25, 1929 (87 y.o. F) Treating RN: Huel Coventry Primary Care Provider: Eustaquio Boyden Other Clinician: Referring Provider: Eustaquio Boyden Treating Provider/Extender: Rowan Blase in Treatment: 26 Subjective Chief Complaint Information obtained from Patient Right Lateral Foot Ulcer History of Present Illness (HPI) 10/12/2019 upon evaluation today patient appears to be doing somewhat poorly in regard to her wound at this point. She has a ulcer over the right fifth metatarsal region. This has been managed by podiatry up to this point. Unfortunately the patient just does  not seem to be making the progress that they were hoping for an subsequently the patient was referred to [place] by [facility] Dr. Allena Katz. With that being said I did note that the patient does have peripheral vascular disease which has been managed by Dr. Drucilla Schmidt her most recent angiogram with intervention was on 08/02/2019. The ABIs following were on the right 1.30 with a TBI of 0.61 normal  left 1.31 with a TBI of 0.58. Obviously things seem to be doing somewhat better at this point hopefully enough to allow her to heal. She is on anticoagulant therapy due to atrial fibrillation long-term. With that being said the patient is unfortunately having quite a bit of discomfort at this time secondary to the wound. She also has erythema which is very likely secondary to infection. I am going obtain a culture of the area today once debridement is complete. Apparently the patient also had a x-ray that was performed by Dr. Allena Katz on 09/21/2019 and this showed that the patient had no obvious signs on x-ray of cortical irregularity and no osteomyelitis obviously noted. With that being said as I discussed with the patient's daughter as well this does not indicate that the patient absolutely has no osteomyelitis as x-rays are only at best 50% helpful in identifying this complication. Nonetheless I do believe that we need to have the patient continue to have this further evaluated specifically I am to recommend that we check into an MRI which I think would be ideal to evaluate whether or not there is osteomyelitis present. 7/16; this is a patient with an ulcer over the right fifth metatarsal head. She [facility] with Dr[facility]. She is due to have an MRI of the foot on 7/26. Using Santyl to the wound 11/04/2019 on evaluation today patient appears to be doing okay in regard to her wound at this time. Unfortunately since I last saw her which was actually her second visit here in the clinic she has moved into a new apartment and then subsequently had a fall. She ended up in the hospital she did fracture her left arm and subsequently has also been dealing with a lot of dizziness she has been at [employer]. She tells me that getting in the change the dressing has been somewhat difficult as far as daily dressing changes are concerned with the Santyl. Nonetheless when they do change it  that is what is been used and her daughter-in-law has also been coming in to help out as well with the dressing changes to try to make sure it gets done daily. Fortunately there is no signs of active infection at this time. No fever chills noted. I do believe the Santyl has been beneficial and is likely still the best way to go at this point for the patient. I did review the patient's MRI which showed some potential for reactive osteitis but did not show any signs of osteomyelitis overtly. That cannot be excluded but again right now it does not appear that is very likely present as well. This is good news. I also did review her culture she has been on the Bactrim she tells me she has taken this and again that was appropriate based on the results of her culture. 11/19/2019 upon evaluation today patient appears to be doing decently well in regard to her wound. Fortunately there is no signs of active infection at this time wound is not significantly larger in fact is roughly about  the same at this point. With that being said there is not as much erythema as previously noted I think the infection seems to be under much better control which is great news. Unfortunately she has fallen since I last saw her and broken her left arm she has an appointment with them following the appointment with me. 8/25; patient was worked in acutely today at the request of home health and was concerned about her right heel, generalized erythema in her feet bilaterally. According to her daughter things have been getting worse over the last for 5 days but not precisely from a wound care point of view. Her wound is on the lateral part of the right fifth metatarsal head looks about the same as I remember this. She has developed dryness and flaking of the skin on her plantar feet. As well a very tender area localized over the insertion point of the Achilles tendon. She has restless leg syndrome. Does not specifically offload the  area at night. She wears socks at home and does not really give me a pressure on the right heel issue. I reviewed her vascular status. She underwent her last revascularization on 08/02/2019 by Dr. Wyn Quaker. This included an angioplasty of the right peroneal artery as well as the right posterior tibial artery. Her last noninvasive arterial study was on 11/23/2019. On the right her ABI was 1.08 TBI of 0.48. On the left ABI at 1.21 with a TBI of 0.54. Family relates that Dr. Wyn Quaker felt that he had done the best he could and this. We have been using Santyl to the one wound she has on the right lateral fifth metatarsal head. She does not have a new wound today which is the concern that letter coming into the clinic 12/09/2019 on evaluation today patient appears to be doing decently well in regard to her foot ulcer at this point. She does note that with the compression wrap that the home health nurse put on after contacting our clinic that she actually did much better and felt much better. Her heel is also improved. Fortunately there is no signs of active infection at this time. No fevers, chills, nausea, vomiting, or diarrhea. 12/24/2019 upon evaluation today patient appears to be doing somewhat poorly still in regard to her foot. Unfortunately this is just not showing the signs of improvement that I would like to see it is a little bit cleaner but also appears to be erythematous and I believe there is infection noted at this point. We will get a need to address this in my opinion. I did actually recommend that we switch back to the Iodoflex as well apparently the home health nurse had switch this away that not really what we want to do at this point in my opinion. They were using silver alginate I think most recently. 01/06/2020 upon evaluation today patient actually appears to be doing much better in regard to her wound. We have switched back to the silver alginate after her home health nurse contacted me and  honestly I feel like she is doing quite well in that regard. She does have a lot of drainage and therefore I think that is probably a better option for. With that being said she is tolerating the compression wrap without complication that PATCHES, MONTECINO. ([id number]) also seems to be helping. 01/28/2020 on evaluation today patient appears to be doing well with regard to her foot ulcer. I do feel like she is showing signs of improvement which is  great news. There is no signs of active infection at this time which is also good news. No fevers, chills, nausea, vomiting, or diarrhea. Unfortunately she did have a fall and sustained an injury to the left upper arm. She has a skin tear here this appears to be doing okay although the dressing was stuck to this this was an alginate dressing. I really think she probably needs something like Xeroform to try to prevent anything from sticking to this region. Other than that she seems to be doing quite well. 02/11/2020 on evaluation today patient's arm skin tear appears to be completely healed which is great. Unfortunately she is still having issues at this time with her foot although this seems to be showing signs of improvement it still very slow. There is no signs of active infection at this time. 03/06/2020 upon evaluation today patient appears to be doing decently well in regard to her foot ulcer. There is some minimal slough noted on the surface of the wound building up at this point. Otherwise I really feel like she is doing quite nicely with quite a bit of new granulation and epithelial growth. 03/23/2020 upon evaluation today patient appears to be doing well with regards to her wound. She has been tolerating the dressing changes without complication. Fortunately there is no evidence of active infection and overall feel like her foot ulcer is doing excellent at this point. 04/13/2020 on evaluation today patient appears to be doing well with regard to her  foot ulcer. She has been tolerating the dressing changes without complication. Fortunately there is no signs of active infection at this time. Overall I feel like the wound is getting close to complete closure and this appears to be doing quite well in my opinion. Objective Constitutional Well-nourished and well-hydrated in no acute distress. Vitals Time Taken: 3:00 PM, Height: 62 in, Weight: 140 lbs, BMI: 25.6, Temperature: 98.0 F, Pulse: 70 bpm, Respiratory Rate: 16 breaths/min, Blood Pressure: 182/72 mmHg. Respiratory normal breathing without difficulty. Psychiatric this patient is able to make decisions and demonstrates good insight into disease process. Alert and Oriented x 3. pleasant and cooperative. General Notes: Upon inspection patient's wound bed showed signs again of good granulation and epithelization there is no evidence of active infection and I do believe she is making great progress. The wound is very close to closing. Integumentary (Hair, Skin) Wound #3 status is Open. Original cause of wound was Gradually Appeared. The wound is located on the Right Metatarsal head fifth. The wound measures 1.1cm length x 0.7cm width x 0.4cm depth; 0.605cm^2 area and 0.242cm^3 volume. There is Fat Layer (Subcutaneous Tissue) exposed. There is no tunneling or undermining noted. There is a medium amount of serosanguineous drainage noted. The wound margin is distinct with the outline attached to the wound base. There is large (67-100%) red, pink, hyper - granulation within the wound bed. There is a small (1-33%) amount of necrotic tissue within the wound bed including Adherent Slough. Assessment Active Problems ICD-10 Other specified peripheral vascular diseases Non-pressure chronic ulcer of other part of right foot with fat layer exposed Unspecified open wound of left upper arm, initial encounter Long term (current) use of anticoagulants Paroxysmal atrial fibrillation Tinea  pedis GENIVA, LOOTENS. ([id number]) Plan Follow-up Appointments: Return Appointment in 2 weeks. Home Health: Continue Home Health for wound care. May utilize formulary equivalent dressing for wound treatment orders unless otherwise specified. Home Health Nurse may visit PRN to address patient s wound care needs. Rolene Arbour  WOUND #3: - Metatarsal head fifth Wound Laterality: Right Cleanser: Soap and Water 2 x Per Week/ Discharge Instructions: Gently cleanse wound with antibacterial soap, rinse and pat dry prior to dressing wounds Primary Dressing: Prisma 4.34 (in) 2 x Per Week/ Discharge Instructions: Moisten w/normal saline or sterile water; Cover wound as directed. Do not remove from wound bed. Add-Ons: Bordered Gauze Sterile-HBD 4x4 (in/in) 2 x Per Week/ Discharge Instructions: Cover wound with Bordered Guaze Sterile as directed 1. Would recommend currently that we going to continue with the wound care measures as before and the patient is in agreement with the plan this includes the use of the order gauze dressing to cover the wound. With that being said we are going to switch to a silver collagen dressing instead of the alginate she has very little drainage at this point. 2. I am also can recommend the patient continue to monitor for any signs of worsening such as increased pain or drainage. Right now we see no evidence of that overall I think she is doing quite well. We will see patient back for reevaluation in 2 weeks here in the clinic. If anything worsens or changes patient will contact our office for additional recommendations. Electronic Signature(s) Signed: 04/13/2020 3:55:56 PM By: Lenda Kelp PA-C Entered By: Lenda Kelp on 04/13/2020 15:55:55 [name] ([id number]) -------------------------------------------------------------------------------- SuperBill Details Patient Name: [name] Date of Service: 04/13/2020 Medical Record Number:  [id number] Patient Account Number: [id number] Date of Birth/Sex: Jul 10, 1929 (87 y.o. F) Treating RN: Yevonne Pax Primary Care Provider: Eustaquio Boyden Other Clinician: Referring Provider: Eustaquio Boyden Treating Provider/Extender: Rowan Blase in Treatment: 26 Diagnosis Coding ICD-10 Codes Code Description I73.89 Other specified peripheral vascular diseases L97.512 Non-pressure chronic ulcer of other part of right foot with fat layer exposed S41.102A Unspecified open wound of left upper arm, initial encounter Z79.01 Long term (current) use of anticoagulants I48.0 Paroxysmal atrial fibrillation B35.3 Tinea pedis Facility Procedures CPT4 Code: 69629528 Description: [phone] - WOUND CARE VISIT-LEV 2 EST PT Modifier: Quantity: 1 Physician Procedures CPT4 Code: [id number] Description: 99213 - WC PHYS LEVEL 3 - EST PT Modifier: Quantity: 1 CPT4 Code: Description: ICD-10 Diagnosis Description I73.89 Other specified peripheral vascular diseases L97.512 Non-pressure chronic ulcer of other part of right foot with fat layer e S41.102A Unspecified open wound of left upper arm, initial encounter Z79.01 Long  term (current) use of anticoagulants Modifier: xposed Quantity: Electronic Signature(s) Signed: 04/13/2020 3:56:13 PM By: Lenda Kelp PA-C Entered By: Lenda Kelp on 04/13/2020 15:56:11

## 2020-04-14 DIAGNOSIS — S62102D Fracture of unspecified carpal bone, left wrist, subsequent encounter for fracture with routine healing: Secondary | ICD-10-CM | POA: Diagnosis not present

## 2020-04-14 DIAGNOSIS — Z792 Long term (current) use of antibiotics: Secondary | ICD-10-CM | POA: Diagnosis not present

## 2020-04-14 DIAGNOSIS — G2581 Restless legs syndrome: Secondary | ICD-10-CM | POA: Diagnosis not present

## 2020-04-14 DIAGNOSIS — K219 Gastro-esophageal reflux disease without esophagitis: Secondary | ICD-10-CM | POA: Diagnosis not present

## 2020-04-14 DIAGNOSIS — L97513 Non-pressure chronic ulcer of other part of right foot with necrosis of muscle: Secondary | ICD-10-CM | POA: Diagnosis not present

## 2020-04-14 DIAGNOSIS — E7849 Other hyperlipidemia: Secondary | ICD-10-CM | POA: Diagnosis not present

## 2020-04-14 DIAGNOSIS — Z9181 History of falling: Secondary | ICD-10-CM | POA: Diagnosis not present

## 2020-04-14 DIAGNOSIS — M5136 Other intervertebral disc degeneration, lumbar region: Secondary | ICD-10-CM | POA: Diagnosis not present

## 2020-04-14 DIAGNOSIS — M1990 Unspecified osteoarthritis, unspecified site: Secondary | ICD-10-CM | POA: Diagnosis not present

## 2020-04-14 DIAGNOSIS — I130 Hypertensive heart and chronic kidney disease with heart failure and stage 1 through stage 4 chronic kidney disease, or unspecified chronic kidney disease: Secondary | ICD-10-CM | POA: Diagnosis not present

## 2020-04-14 DIAGNOSIS — I5032 Chronic diastolic (congestive) heart failure: Secondary | ICD-10-CM | POA: Diagnosis not present

## 2020-04-14 DIAGNOSIS — Z7901 Long term (current) use of anticoagulants: Secondary | ICD-10-CM | POA: Diagnosis not present

## 2020-04-14 DIAGNOSIS — I872 Venous insufficiency (chronic) (peripheral): Secondary | ICD-10-CM | POA: Diagnosis not present

## 2020-04-14 DIAGNOSIS — I2510 Atherosclerotic heart disease of native coronary artery without angina pectoris: Secondary | ICD-10-CM | POA: Diagnosis not present

## 2020-04-14 DIAGNOSIS — E538 Deficiency of other specified B group vitamins: Secondary | ICD-10-CM | POA: Diagnosis not present

## 2020-04-14 DIAGNOSIS — Z7982 Long term (current) use of aspirin: Secondary | ICD-10-CM | POA: Diagnosis not present

## 2020-04-14 DIAGNOSIS — N1831 Chronic kidney disease, stage 3a: Secondary | ICD-10-CM | POA: Diagnosis not present

## 2020-04-14 DIAGNOSIS — I10 Essential (primary) hypertension: Secondary | ICD-10-CM | POA: Diagnosis not present

## 2020-04-14 DIAGNOSIS — E559 Vitamin D deficiency, unspecified: Secondary | ICD-10-CM | POA: Diagnosis not present

## 2020-04-14 DIAGNOSIS — M810 Age-related osteoporosis without current pathological fracture: Secondary | ICD-10-CM | POA: Diagnosis not present

## 2020-04-14 DIAGNOSIS — I739 Peripheral vascular disease, unspecified: Secondary | ICD-10-CM | POA: Diagnosis not present

## 2020-04-14 DIAGNOSIS — I4891 Unspecified atrial fibrillation: Secondary | ICD-10-CM | POA: Diagnosis not present

## 2020-04-14 DIAGNOSIS — G9009 Other idiopathic peripheral autonomic neuropathy: Secondary | ICD-10-CM | POA: Diagnosis not present

## 2020-04-14 DIAGNOSIS — I272 Pulmonary hypertension, unspecified: Secondary | ICD-10-CM | POA: Diagnosis not present

## 2020-04-17 DIAGNOSIS — K219 Gastro-esophageal reflux disease without esophagitis: Secondary | ICD-10-CM | POA: Diagnosis not present

## 2020-04-17 DIAGNOSIS — M5136 Other intervertebral disc degeneration, lumbar region: Secondary | ICD-10-CM | POA: Diagnosis not present

## 2020-04-17 DIAGNOSIS — Z9181 History of falling: Secondary | ICD-10-CM | POA: Diagnosis not present

## 2020-04-17 DIAGNOSIS — I872 Venous insufficiency (chronic) (peripheral): Secondary | ICD-10-CM | POA: Diagnosis not present

## 2020-04-17 DIAGNOSIS — G2581 Restless legs syndrome: Secondary | ICD-10-CM | POA: Diagnosis not present

## 2020-04-17 DIAGNOSIS — E559 Vitamin D deficiency, unspecified: Secondary | ICD-10-CM | POA: Diagnosis not present

## 2020-04-17 DIAGNOSIS — M810 Age-related osteoporosis without current pathological fracture: Secondary | ICD-10-CM | POA: Diagnosis not present

## 2020-04-17 DIAGNOSIS — G9009 Other idiopathic peripheral autonomic neuropathy: Secondary | ICD-10-CM | POA: Diagnosis not present

## 2020-04-17 DIAGNOSIS — E538 Deficiency of other specified B group vitamins: Secondary | ICD-10-CM | POA: Diagnosis not present

## 2020-04-17 DIAGNOSIS — E7849 Other hyperlipidemia: Secondary | ICD-10-CM | POA: Diagnosis not present

## 2020-04-17 DIAGNOSIS — M1990 Unspecified osteoarthritis, unspecified site: Secondary | ICD-10-CM | POA: Diagnosis not present

## 2020-04-17 DIAGNOSIS — L97513 Non-pressure chronic ulcer of other part of right foot with necrosis of muscle: Secondary | ICD-10-CM | POA: Diagnosis not present

## 2020-04-17 DIAGNOSIS — I5032 Chronic diastolic (congestive) heart failure: Secondary | ICD-10-CM | POA: Diagnosis not present

## 2020-04-17 DIAGNOSIS — N1831 Chronic kidney disease, stage 3a: Secondary | ICD-10-CM | POA: Diagnosis not present

## 2020-04-17 DIAGNOSIS — Z7982 Long term (current) use of aspirin: Secondary | ICD-10-CM | POA: Diagnosis not present

## 2020-04-17 DIAGNOSIS — I130 Hypertensive heart and chronic kidney disease with heart failure and stage 1 through stage 4 chronic kidney disease, or unspecified chronic kidney disease: Secondary | ICD-10-CM | POA: Diagnosis not present

## 2020-04-17 DIAGNOSIS — I4891 Unspecified atrial fibrillation: Secondary | ICD-10-CM | POA: Diagnosis not present

## 2020-04-17 DIAGNOSIS — I2510 Atherosclerotic heart disease of native coronary artery without angina pectoris: Secondary | ICD-10-CM | POA: Diagnosis not present

## 2020-04-17 DIAGNOSIS — I739 Peripheral vascular disease, unspecified: Secondary | ICD-10-CM | POA: Diagnosis not present

## 2020-04-17 DIAGNOSIS — I272 Pulmonary hypertension, unspecified: Secondary | ICD-10-CM | POA: Diagnosis not present

## 2020-04-17 DIAGNOSIS — Z7901 Long term (current) use of anticoagulants: Secondary | ICD-10-CM | POA: Diagnosis not present

## 2020-04-17 DIAGNOSIS — I10 Essential (primary) hypertension: Secondary | ICD-10-CM | POA: Diagnosis not present

## 2020-04-17 DIAGNOSIS — S62102D Fracture of unspecified carpal bone, left wrist, subsequent encounter for fracture with routine healing: Secondary | ICD-10-CM | POA: Diagnosis not present

## 2020-04-17 DIAGNOSIS — Z792 Long term (current) use of antibiotics: Secondary | ICD-10-CM | POA: Diagnosis not present

## 2020-04-19 DIAGNOSIS — I5032 Chronic diastolic (congestive) heart failure: Secondary | ICD-10-CM | POA: Diagnosis not present

## 2020-04-19 DIAGNOSIS — I872 Venous insufficiency (chronic) (peripheral): Secondary | ICD-10-CM | POA: Diagnosis not present

## 2020-04-19 DIAGNOSIS — I4891 Unspecified atrial fibrillation: Secondary | ICD-10-CM | POA: Diagnosis not present

## 2020-04-19 DIAGNOSIS — N1831 Chronic kidney disease, stage 3a: Secondary | ICD-10-CM | POA: Diagnosis not present

## 2020-04-19 DIAGNOSIS — Z7982 Long term (current) use of aspirin: Secondary | ICD-10-CM | POA: Diagnosis not present

## 2020-04-19 DIAGNOSIS — M810 Age-related osteoporosis without current pathological fracture: Secondary | ICD-10-CM | POA: Diagnosis not present

## 2020-04-19 DIAGNOSIS — I272 Pulmonary hypertension, unspecified: Secondary | ICD-10-CM | POA: Diagnosis not present

## 2020-04-19 DIAGNOSIS — G9009 Other idiopathic peripheral autonomic neuropathy: Secondary | ICD-10-CM | POA: Diagnosis not present

## 2020-04-19 DIAGNOSIS — I2510 Atherosclerotic heart disease of native coronary artery without angina pectoris: Secondary | ICD-10-CM | POA: Diagnosis not present

## 2020-04-19 DIAGNOSIS — E559 Vitamin D deficiency, unspecified: Secondary | ICD-10-CM | POA: Diagnosis not present

## 2020-04-19 DIAGNOSIS — E538 Deficiency of other specified B group vitamins: Secondary | ICD-10-CM | POA: Diagnosis not present

## 2020-04-19 DIAGNOSIS — Z9181 History of falling: Secondary | ICD-10-CM | POA: Diagnosis not present

## 2020-04-19 DIAGNOSIS — M5136 Other intervertebral disc degeneration, lumbar region: Secondary | ICD-10-CM | POA: Diagnosis not present

## 2020-04-19 DIAGNOSIS — S62102D Fracture of unspecified carpal bone, left wrist, subsequent encounter for fracture with routine healing: Secondary | ICD-10-CM | POA: Diagnosis not present

## 2020-04-19 DIAGNOSIS — Z792 Long term (current) use of antibiotics: Secondary | ICD-10-CM | POA: Diagnosis not present

## 2020-04-19 DIAGNOSIS — G2581 Restless legs syndrome: Secondary | ICD-10-CM | POA: Diagnosis not present

## 2020-04-19 DIAGNOSIS — M1990 Unspecified osteoarthritis, unspecified site: Secondary | ICD-10-CM | POA: Diagnosis not present

## 2020-04-19 DIAGNOSIS — I10 Essential (primary) hypertension: Secondary | ICD-10-CM | POA: Diagnosis not present

## 2020-04-19 DIAGNOSIS — I739 Peripheral vascular disease, unspecified: Secondary | ICD-10-CM | POA: Diagnosis not present

## 2020-04-19 DIAGNOSIS — K219 Gastro-esophageal reflux disease without esophagitis: Secondary | ICD-10-CM | POA: Diagnosis not present

## 2020-04-19 DIAGNOSIS — L97513 Non-pressure chronic ulcer of other part of right foot with necrosis of muscle: Secondary | ICD-10-CM | POA: Diagnosis not present

## 2020-04-19 DIAGNOSIS — Z7901 Long term (current) use of anticoagulants: Secondary | ICD-10-CM | POA: Diagnosis not present

## 2020-04-19 DIAGNOSIS — I130 Hypertensive heart and chronic kidney disease with heart failure and stage 1 through stage 4 chronic kidney disease, or unspecified chronic kidney disease: Secondary | ICD-10-CM | POA: Diagnosis not present

## 2020-04-19 DIAGNOSIS — E7849 Other hyperlipidemia: Secondary | ICD-10-CM | POA: Diagnosis not present

## 2020-04-20 DIAGNOSIS — I4891 Unspecified atrial fibrillation: Secondary | ICD-10-CM | POA: Diagnosis not present

## 2020-04-20 DIAGNOSIS — M810 Age-related osteoporosis without current pathological fracture: Secondary | ICD-10-CM | POA: Diagnosis not present

## 2020-04-20 DIAGNOSIS — Z7901 Long term (current) use of anticoagulants: Secondary | ICD-10-CM | POA: Diagnosis not present

## 2020-04-20 DIAGNOSIS — G9009 Other idiopathic peripheral autonomic neuropathy: Secondary | ICD-10-CM | POA: Diagnosis not present

## 2020-04-20 DIAGNOSIS — Z9181 History of falling: Secondary | ICD-10-CM | POA: Diagnosis not present

## 2020-04-20 DIAGNOSIS — I130 Hypertensive heart and chronic kidney disease with heart failure and stage 1 through stage 4 chronic kidney disease, or unspecified chronic kidney disease: Secondary | ICD-10-CM | POA: Diagnosis not present

## 2020-04-20 DIAGNOSIS — I739 Peripheral vascular disease, unspecified: Secondary | ICD-10-CM | POA: Diagnosis not present

## 2020-04-20 DIAGNOSIS — K219 Gastro-esophageal reflux disease without esophagitis: Secondary | ICD-10-CM | POA: Diagnosis not present

## 2020-04-20 DIAGNOSIS — I10 Essential (primary) hypertension: Secondary | ICD-10-CM | POA: Diagnosis not present

## 2020-04-20 DIAGNOSIS — S62102D Fracture of unspecified carpal bone, left wrist, subsequent encounter for fracture with routine healing: Secondary | ICD-10-CM | POA: Diagnosis not present

## 2020-04-20 DIAGNOSIS — L97513 Non-pressure chronic ulcer of other part of right foot with necrosis of muscle: Secondary | ICD-10-CM | POA: Diagnosis not present

## 2020-04-20 DIAGNOSIS — E7849 Other hyperlipidemia: Secondary | ICD-10-CM | POA: Diagnosis not present

## 2020-04-20 DIAGNOSIS — Z792 Long term (current) use of antibiotics: Secondary | ICD-10-CM | POA: Diagnosis not present

## 2020-04-20 DIAGNOSIS — M1990 Unspecified osteoarthritis, unspecified site: Secondary | ICD-10-CM | POA: Diagnosis not present

## 2020-04-20 DIAGNOSIS — E559 Vitamin D deficiency, unspecified: Secondary | ICD-10-CM | POA: Diagnosis not present

## 2020-04-20 DIAGNOSIS — M5136 Other intervertebral disc degeneration, lumbar region: Secondary | ICD-10-CM | POA: Diagnosis not present

## 2020-04-20 DIAGNOSIS — I872 Venous insufficiency (chronic) (peripheral): Secondary | ICD-10-CM | POA: Diagnosis not present

## 2020-04-20 DIAGNOSIS — G2581 Restless legs syndrome: Secondary | ICD-10-CM | POA: Diagnosis not present

## 2020-04-20 DIAGNOSIS — I272 Pulmonary hypertension, unspecified: Secondary | ICD-10-CM | POA: Diagnosis not present

## 2020-04-20 DIAGNOSIS — E538 Deficiency of other specified B group vitamins: Secondary | ICD-10-CM | POA: Diagnosis not present

## 2020-04-20 DIAGNOSIS — I5032 Chronic diastolic (congestive) heart failure: Secondary | ICD-10-CM | POA: Diagnosis not present

## 2020-04-20 DIAGNOSIS — N1831 Chronic kidney disease, stage 3a: Secondary | ICD-10-CM | POA: Diagnosis not present

## 2020-04-20 DIAGNOSIS — Z7982 Long term (current) use of aspirin: Secondary | ICD-10-CM | POA: Diagnosis not present

## 2020-04-20 DIAGNOSIS — I2510 Atherosclerotic heart disease of native coronary artery without angina pectoris: Secondary | ICD-10-CM | POA: Diagnosis not present

## 2020-04-24 ENCOUNTER — Other Ambulatory Visit: Payer: Self-pay | Admitting: Family Medicine

## 2020-04-24 DIAGNOSIS — I2510 Atherosclerotic heart disease of native coronary artery without angina pectoris: Secondary | ICD-10-CM | POA: Diagnosis not present

## 2020-04-24 DIAGNOSIS — I5032 Chronic diastolic (congestive) heart failure: Secondary | ICD-10-CM | POA: Diagnosis not present

## 2020-04-24 DIAGNOSIS — I10 Essential (primary) hypertension: Secondary | ICD-10-CM | POA: Diagnosis not present

## 2020-04-24 DIAGNOSIS — E538 Deficiency of other specified B group vitamins: Secondary | ICD-10-CM | POA: Diagnosis not present

## 2020-04-24 DIAGNOSIS — M810 Age-related osteoporosis without current pathological fracture: Secondary | ICD-10-CM | POA: Diagnosis not present

## 2020-04-24 DIAGNOSIS — I130 Hypertensive heart and chronic kidney disease with heart failure and stage 1 through stage 4 chronic kidney disease, or unspecified chronic kidney disease: Secondary | ICD-10-CM | POA: Diagnosis not present

## 2020-04-24 DIAGNOSIS — Z7982 Long term (current) use of aspirin: Secondary | ICD-10-CM | POA: Diagnosis not present

## 2020-04-24 DIAGNOSIS — I272 Pulmonary hypertension, unspecified: Secondary | ICD-10-CM | POA: Diagnosis not present

## 2020-04-24 DIAGNOSIS — Z9181 History of falling: Secondary | ICD-10-CM | POA: Diagnosis not present

## 2020-04-24 DIAGNOSIS — E559 Vitamin D deficiency, unspecified: Secondary | ICD-10-CM | POA: Diagnosis not present

## 2020-04-24 DIAGNOSIS — N1831 Chronic kidney disease, stage 3a: Secondary | ICD-10-CM | POA: Diagnosis not present

## 2020-04-24 DIAGNOSIS — I4891 Unspecified atrial fibrillation: Secondary | ICD-10-CM | POA: Diagnosis not present

## 2020-04-24 DIAGNOSIS — Z792 Long term (current) use of antibiotics: Secondary | ICD-10-CM | POA: Diagnosis not present

## 2020-04-24 DIAGNOSIS — S62102D Fracture of unspecified carpal bone, left wrist, subsequent encounter for fracture with routine healing: Secondary | ICD-10-CM | POA: Diagnosis not present

## 2020-04-24 DIAGNOSIS — I739 Peripheral vascular disease, unspecified: Secondary | ICD-10-CM | POA: Diagnosis not present

## 2020-04-24 DIAGNOSIS — G9009 Other idiopathic peripheral autonomic neuropathy: Secondary | ICD-10-CM | POA: Diagnosis not present

## 2020-04-24 DIAGNOSIS — I872 Venous insufficiency (chronic) (peripheral): Secondary | ICD-10-CM | POA: Diagnosis not present

## 2020-04-24 DIAGNOSIS — Z7901 Long term (current) use of anticoagulants: Secondary | ICD-10-CM | POA: Diagnosis not present

## 2020-04-24 DIAGNOSIS — K219 Gastro-esophageal reflux disease without esophagitis: Secondary | ICD-10-CM | POA: Diagnosis not present

## 2020-04-24 DIAGNOSIS — M1990 Unspecified osteoarthritis, unspecified site: Secondary | ICD-10-CM | POA: Diagnosis not present

## 2020-04-24 DIAGNOSIS — L97513 Non-pressure chronic ulcer of other part of right foot with necrosis of muscle: Secondary | ICD-10-CM | POA: Diagnosis not present

## 2020-04-24 DIAGNOSIS — G2581 Restless legs syndrome: Secondary | ICD-10-CM | POA: Diagnosis not present

## 2020-04-24 DIAGNOSIS — M5136 Other intervertebral disc degeneration, lumbar region: Secondary | ICD-10-CM | POA: Diagnosis not present

## 2020-04-24 DIAGNOSIS — E7849 Other hyperlipidemia: Secondary | ICD-10-CM | POA: Diagnosis not present

## 2020-04-26 DIAGNOSIS — I130 Hypertensive heart and chronic kidney disease with heart failure and stage 1 through stage 4 chronic kidney disease, or unspecified chronic kidney disease: Secondary | ICD-10-CM | POA: Diagnosis not present

## 2020-04-26 DIAGNOSIS — M810 Age-related osteoporosis without current pathological fracture: Secondary | ICD-10-CM | POA: Diagnosis not present

## 2020-04-26 DIAGNOSIS — I5032 Chronic diastolic (congestive) heart failure: Secondary | ICD-10-CM | POA: Diagnosis not present

## 2020-04-26 DIAGNOSIS — I739 Peripheral vascular disease, unspecified: Secondary | ICD-10-CM | POA: Diagnosis not present

## 2020-04-26 DIAGNOSIS — E7849 Other hyperlipidemia: Secondary | ICD-10-CM | POA: Diagnosis not present

## 2020-04-26 DIAGNOSIS — Z9181 History of falling: Secondary | ICD-10-CM | POA: Diagnosis not present

## 2020-04-26 DIAGNOSIS — I272 Pulmonary hypertension, unspecified: Secondary | ICD-10-CM | POA: Diagnosis not present

## 2020-04-26 DIAGNOSIS — L97513 Non-pressure chronic ulcer of other part of right foot with necrosis of muscle: Secondary | ICD-10-CM | POA: Diagnosis not present

## 2020-04-26 DIAGNOSIS — Z7982 Long term (current) use of aspirin: Secondary | ICD-10-CM | POA: Diagnosis not present

## 2020-04-26 DIAGNOSIS — I2510 Atherosclerotic heart disease of native coronary artery without angina pectoris: Secondary | ICD-10-CM | POA: Diagnosis not present

## 2020-04-26 DIAGNOSIS — I872 Venous insufficiency (chronic) (peripheral): Secondary | ICD-10-CM | POA: Diagnosis not present

## 2020-04-26 DIAGNOSIS — S62102D Fracture of unspecified carpal bone, left wrist, subsequent encounter for fracture with routine healing: Secondary | ICD-10-CM | POA: Diagnosis not present

## 2020-04-26 DIAGNOSIS — G2581 Restless legs syndrome: Secondary | ICD-10-CM | POA: Diagnosis not present

## 2020-04-26 DIAGNOSIS — E559 Vitamin D deficiency, unspecified: Secondary | ICD-10-CM | POA: Diagnosis not present

## 2020-04-26 DIAGNOSIS — Z792 Long term (current) use of antibiotics: Secondary | ICD-10-CM | POA: Diagnosis not present

## 2020-04-26 DIAGNOSIS — K219 Gastro-esophageal reflux disease without esophagitis: Secondary | ICD-10-CM | POA: Diagnosis not present

## 2020-04-26 DIAGNOSIS — E538 Deficiency of other specified B group vitamins: Secondary | ICD-10-CM | POA: Diagnosis not present

## 2020-04-26 DIAGNOSIS — M5136 Other intervertebral disc degeneration, lumbar region: Secondary | ICD-10-CM | POA: Diagnosis not present

## 2020-04-26 DIAGNOSIS — I10 Essential (primary) hypertension: Secondary | ICD-10-CM | POA: Diagnosis not present

## 2020-04-26 DIAGNOSIS — I4891 Unspecified atrial fibrillation: Secondary | ICD-10-CM | POA: Diagnosis not present

## 2020-04-26 DIAGNOSIS — Z7901 Long term (current) use of anticoagulants: Secondary | ICD-10-CM | POA: Diagnosis not present

## 2020-04-26 DIAGNOSIS — M1990 Unspecified osteoarthritis, unspecified site: Secondary | ICD-10-CM | POA: Diagnosis not present

## 2020-04-26 DIAGNOSIS — G9009 Other idiopathic peripheral autonomic neuropathy: Secondary | ICD-10-CM | POA: Diagnosis not present

## 2020-04-26 DIAGNOSIS — N1831 Chronic kidney disease, stage 3a: Secondary | ICD-10-CM | POA: Diagnosis not present

## 2020-04-27 ENCOUNTER — Telehealth: Payer: Self-pay | Admitting: Family Medicine

## 2020-04-27 ENCOUNTER — Other Ambulatory Visit: Payer: Self-pay

## 2020-04-27 ENCOUNTER — Other Ambulatory Visit: Payer: Self-pay | Admitting: Family Medicine

## 2020-04-27 ENCOUNTER — Encounter: Payer: Medicare Other | Admitting: Physician Assistant

## 2020-04-27 DIAGNOSIS — I480 Paroxysmal atrial fibrillation: Secondary | ICD-10-CM | POA: Diagnosis not present

## 2020-04-27 DIAGNOSIS — Z7901 Long term (current) use of anticoagulants: Secondary | ICD-10-CM | POA: Diagnosis not present

## 2020-04-27 DIAGNOSIS — L97512 Non-pressure chronic ulcer of other part of right foot with fat layer exposed: Secondary | ICD-10-CM | POA: Diagnosis not present

## 2020-04-27 DIAGNOSIS — S41102A Unspecified open wound of left upper arm, initial encounter: Secondary | ICD-10-CM | POA: Diagnosis not present

## 2020-04-27 DIAGNOSIS — I739 Peripheral vascular disease, unspecified: Secondary | ICD-10-CM | POA: Diagnosis not present

## 2020-04-27 NOTE — Telephone Encounter (Signed)
Pharmacy requests refill on: Duloxetine 60 mg, Pantoprazole 40 mg, Atorvastatin 20 mg   LAST REFILL: 10/14/2019 (Q-90, R-1) LAST OV: 03/22/2020 NEXT OV: Not Scheduled  PHARMACY: [facility]

## 2020-04-27 NOTE — Progress Notes (Signed)
JULLIANNA, GABOR ([id number]) Visit Report for 04/27/2020 Arrival Information Details Patient Name: [name], [name]. Date of Service: 04/27/2020 1:30 PM Medical Record Number: [id number] Patient Account Number: [id number] Date of Birth/Sex: 11-11-1929 (87 y.o. F) Treating RN: Dolan Amen Primary Care Adaliz Dobis: Ria Bush Other Clinician: Referring Teila Skalsky: Ria Bush Treating Davy Faught/Extender: Skipper Cliche in Treatment: 28 Visit Information History Since Last Visit Pain Present Now: No Patient Arrived: Walker Arrival Time: 13:38 Accompanied By: daughter Transfer Assistance: None Patient Identification Verified: Yes Secondary Verification Process Completed: Yes Patient Requires Transmission-Based No Precautions: Patient Has Alerts: Yes Patient Alerts: 08/31/19 ABI R)1.3 L) 1.31 08/31/19 TBI R).61 L).58 Electronic Signature(s) Signed: 04/27/2020 2:21:33 PM By: Georges Mouse, Minus Breeding RN Entered By: Georges Mouse, Minus Breeding on 04/27/2020 13:39:43 [name] ([id number]) -------------------------------------------------------------------------------- Clinic Level of Care Assessment Details Patient Name: KIAH, VANALSTINE. Date of Service: 04/27/2020 1:30 PM Medical Record Number: [id number] Patient Account Number: [id number] Date of Birth/Sex: Oct 15, 1929 (87 y.o. F) Treating RN: Dolan Amen Primary Care Loralyn Rachel: Ria Bush Other Clinician: Referring Omeed Osuna: Ria Bush Treating Zarek Relph/Extender: Skipper Cliche in Treatment: 28 Clinic Level of Care Assessment Items TOOL 4 Quantity Score X - Use when only an EandM is performed on FOLLOW-UP visit 1 0 ASSESSMENTS - Nursing Assessment / Reassessment X - Reassessment of Co-morbidities (includes updates in patient status) 1 10 X- 1 5 Reassessment of Adherence to Treatment Plan ASSESSMENTS - Wound and Skin Assessment / Reassessment X - Simple Wound Assessment / Reassessment - one wound 1  5 []  - 0 Complex Wound Assessment / Reassessment - multiple wounds []  - 0 Dermatologic / Skin Assessment (not related to wound area) ASSESSMENTS - Focused Assessment []  - Circumferential Edema Measurements - multi extremities 0 []  - 0 Nutritional Assessment / Counseling / Intervention []  - 0 Lower Extremity Assessment (monofilament, tuning fork, pulses) []  - 0 Peripheral Arterial Disease Assessment (using hand held doppler) ASSESSMENTS - Ostomy and/or Continence Assessment and Care []  - Incontinence Assessment and Management 0 []  - 0 Ostomy Care Assessment and Management (repouching, etc.) PROCESS - Coordination of Care X - Simple Patient / Family Education for ongoing care 1 15 []  - 0 Complex (extensive) Patient / Family Education for ongoing care []  - 0 Staff obtains Programmer, systems, Records, Test Results / Process Orders []  - 0 Staff telephones HHA, Nursing Homes / Clarify orders / etc []  - 0 Routine Transfer to another Facility (non-emergent condition) []  - 0 Routine Hospital Admission (non-emergent condition) []  - 0 New Admissions / Biomedical engineer / Ordering NPWT, Apligraf, etc. []  - 0 Emergency Hospital Admission (emergent condition) X- 1 10 Simple Discharge Coordination []  - 0 Complex (extensive) Discharge Coordination PROCESS - Special Needs []  - Pediatric / Minor Patient Management 0 []  - 0 Isolation Patient Management []  - 0 Hearing / Language / Visual special needs []  - 0 Assessment of Community assistance (transportation, D/C planning, etc.) []  - 0 Additional assistance / Altered mentation []  - 0 Support Surface(s) Assessment (bed, cushion, seat, etc.) INTERVENTIONS - Wound Cleansing / Measurement CAMIAH, HUMM. ([id number]) X- 1 5 Simple Wound Cleansing - one wound []  - 0 Complex Wound Cleansing - multiple wounds X- 1 5 Wound Imaging (photographs - any number of wounds) []  - 0 Wound Tracing (instead of photographs) X- 1 5 Simple Wound  Measurement - one wound []  - 0 Complex Wound Measurement - multiple wounds INTERVENTIONS - Wound Dressings []  - Small Wound Dressing one or multiple wounds 0 X- 1 15 Medium  Wound Dressing one or multiple wounds []  - 0 Large Wound Dressing one or multiple wounds []  - 0 Application of Medications - topical []  - 0 Application of Medications - injection INTERVENTIONS - Miscellaneous []  - External ear exam 0 []  - 0 Specimen Collection (cultures, biopsies, blood, body fluids, etc.) []  - 0 Specimen(s) / Culture(s) sent or taken to Lab for analysis []  - 0 Patient Transfer (multiple staff / Civil Service fast streamer / Similar devices) []  - 0 Simple Staple / Suture removal (25 or less) []  - 0 Complex Staple / Suture removal (26 or more) []  - 0 Hypo / Hyperglycemic Management (close monitor of Blood Glucose) []  - 0 Ankle / Brachial Index (ABI) - do not check if billed separately X- 1 5 Vital Signs Has the patient been seen at the hospital within the last three years: Yes Total Score: 80 Level Of Care: New/Established - Level 3 Electronic Signature(s) Signed: 04/27/2020 2:21:33 PM By: Georges Mouse, Minus Breeding RN Entered By: Georges Mouse, Minus Breeding on 04/27/2020 14:02:02 [name] ([id number]) -------------------------------------------------------------------------------- Encounter Discharge Information Details Patient Name: [name] Date of Service: 04/27/2020 1:30 PM Medical Record Number: [id number] Patient Account Number: [id number] Date of Birth/Sex: Nov 21, 1929 (87 y.o. F) Treating RN: Dolan Amen Primary Care Ulah Olmo: Ria Bush Other Clinician: Referring Michal Strzelecki: Ria Bush Treating Hai Grabe/Extender: Skipper Cliche in Treatment: 28 Encounter Discharge Information Items Discharge Condition: Stable Ambulatory Status: Walker Discharge Destination: Home Transportation: Private Auto Accompanied By: daughter Schedule Follow-up Appointment: Yes Clinical  Summary of Care: Electronic Signature(s) Signed: 04/27/2020 2:21:33 PM By: Georges Mouse, Minus Breeding RN Entered By: Georges Mouse, Minus Breeding on 04/27/2020 14:03:01 [name] ([id number]) -------------------------------------------------------------------------------- Lower Extremity Assessment Details Patient Name: [name] Date of Service: 04/27/2020 1:30 PM Medical Record Number: [id number] Patient Account Number: [id number] Date of Birth/Sex: [birth year] (87 y.o. F) Treating RN: Dolan Amen Primary Care Barnet Benavides: Ria Bush Other Clinician: Referring Evvie Behrmann: Ria Bush Treating Leanard Dimaio/Extender: Jeri Cos Weeks in Treatment: 28 Edema Assessment Assessed: [Left: No] [Right: Yes] Edema: [Left: N] [Right: o] Vascular Assessment Pulses: Dorsalis Pedis Palpable: [Right:Yes] Electronic Signature(s) Signed: 04/27/2020 2:21:33 PM By: Georges Mouse, Minus Breeding RN Entered By: Georges Mouse, Minus Breeding on 04/27/2020 13:49:00 [name] ([id number]) -------------------------------------------------------------------------------- Multi Wound Chart Details Patient Name: [name]. Date of Service: 04/27/2020 1:30 PM Medical Record Number: [id number] Patient Account Number: [id number] Date of Birth/Sex: 07-07-1929 (87 y.o. F) Treating RN: Dolan Amen Primary Care Bernita Beckstrom: Ria Bush Other Clinician: Referring Arif Amendola: Ria Bush Treating Rivaldo Hineman/Extender: Skipper Cliche in Treatment: 28 Vital Signs Height(in): 62 Pulse(bpm): 70 Weight(lbs): 140 Blood Pressure(mmHg): 168/70 Body Mass Index(BMI): 26 Temperature(F): 98.5 Respiratory Rate(breaths/min): 18 Photos: [N/A:N/A] Wound Location: Right Metatarsal head fifth N/A N/A Wounding Event: Gradually Appeared N/A N/A Primary Etiology: Arterial Insufficiency Ulcer N/A N/A Comorbid History: Arrhythmia, Osteoarthritis N/A N/A Date Acquired: 04/09/2019 N/A N/A Weeks of Treatment: 28  N/A N/A Wound Status: Open N/A N/A Measurements L x W x D (cm) 0.3x0.4x0.3 N/A N/A Area (cm) : 0.094 N/A N/A Volume (cm) : 0.028 N/A N/A % Reduction in Area: 91.70% N/A N/A % Reduction in Volume: 75.20% N/A N/A Classification: Full Thickness Without Exposed N/A N/A Support Structures Exudate Amount: Medium N/A N/A Exudate Type: Serosanguineous N/A N/A Exudate Color: red, brown N/A N/A Wound Margin: Distinct, outline attached N/A N/A Granulation Amount: Large (67-100%) N/A N/A Granulation Quality: Pink, Pale, Hyper-granulation N/A N/A Necrotic Amount: Small (1-33%) N/A N/A Exposed Structures: Fat Layer (Subcutaneous Tissue): N/A N/A Yes Fascia: No Tendon: No  Muscle: No Joint: No Bone: No Epithelialization: Small (1-33%) N/A N/A Treatment Notes Electronic Signature(s) Signed: 04/27/2020 2:21:33 PM By: Georges Mouse, Minus Breeding RN Entered By: Georges Mouse, Kenia on 04/27/2020 13:53:41 JOCELYNE, REINERTSEN ([id number]) -------------------------------------------------------------------------------- [place] Details Patient Name: DOREENA, MAULDEN. Date of Service: 04/27/2020 1:30 PM Medical Record Number: [id number] Patient Account Number: [id number] Date of Birth/Sex: 07-06-1929 (87 y.o. F) Treating RN: Dolan Amen Primary Care Anmol Paschen: Ria Bush Other Clinician: Referring Saben Donigan: Ria Bush Treating Johnathan Tortorelli/Extender: Skipper Cliche in Treatment: 28 Active Inactive Abuse / Safety / Falls / Self Care Management Nursing Diagnoses: Potential for falls Goals: Patient/caregiver will verbalize understanding of skin care regimen Date Initiated: 10/12/2019 Target Resolution Date: 05/12/2020 Goal Status: Active Interventions: Assess fall risk on admission and as needed Notes: Electronic Signature(s) Signed: 04/27/2020 2:21:33 PM By: Georges Mouse, Minus Breeding RN Entered By: Georges Mouse, Minus Breeding on 04/27/2020 13:53:34 [name]  ([id number]) -------------------------------------------------------------------------------- Pain Assessment Details Patient Name: [name]. Date of Service: 04/27/2020 1:30 PM Medical Record Number: [id number] Patient Account Number: [id number] Date of Birth/Sex: 1929/11/13 (87 y.o. F) Treating RN: Dolan Amen Primary Care Asiah Befort: Ria Bush Other Clinician: Referring Damione Robideau: Ria Bush Treating Taylan Mayhan/Extender: Skipper Cliche in Treatment: 28 Active Problems Location of Pain Severity and Description of Pain Patient Has Paino No Site Locations Rate the pain. Current Pain Level: 0 Pain Management and Medication Current Pain Management: Electronic Signature(s) Signed: 04/27/2020 2:21:33 PM By: Georges Mouse, Minus Breeding RN Entered By: Georges Mouse, Kenia on 04/27/2020 13:41:58 [name] ([id number]) -------------------------------------------------------------------------------- Patient/Caregiver Education Details Patient Name: [name] Date of Service: 04/27/2020 1:30 PM Medical Record Number: [id number] Patient Account Number: [id number] Date of Birth/Gender: [birth year] (87 y.o. F) Treating RN: Dolan Amen Primary Care Physician: Ria Bush Other Clinician: Referring Physician: Ria Bush Treating Physician/Extender: Skipper Cliche in Treatment: 28 Education Assessment Education Provided To: Patient Education Topics Provided Wound/Skin Impairment: Methods: Explain/Verbal Responses: State content correctly Electronic Signature(s) Signed: 04/27/2020 2:21:33 PM By: Georges Mouse, Minus Breeding RN Entered By: Georges Mouse, Minus Breeding on 04/27/2020 14:02:22 [name] ([id number]) -------------------------------------------------------------------------------- Wound Assessment Details Patient Name: [name] Date of Service: 04/27/2020 1:30 PM Medical Record Number: [id number] Patient Account Number:  [id number] Date of Birth/Sex: 07-31-1929 (87 y.o. F) Treating RN: Dolan Amen Primary Care Krystn Dermody: Ria Bush Other Clinician: Referring Albino Bufford: Ria Bush Treating Damascus Feldpausch/Extender: Skipper Cliche in Treatment: 28 Wound Status Wound Number: 3 Primary Etiology: Arterial Insufficiency Ulcer Wound Location: Right Metatarsal head fifth Wound Status: Open Wounding Event: Gradually Appeared Comorbid History: Arrhythmia, Osteoarthritis Date Acquired: 04/09/2019 Weeks Of Treatment: 28 Clustered Wound: No Photos Wound Measurements Length: (cm) 0.3 Width: (cm) 0.4 Depth: (cm) 0.3 Area: (cm) 0.094 Volume: (cm) 0.028 % Reduction in Area: 91.7% % Reduction in Volume: 75.2% Epithelialization: Small (1-33%) Tunneling: No Undermining: No Wound Description Classification: Full Thickness Without Exposed Support Structu Wound Margin: Distinct, outline attached Exudate Amount: Medium Exudate Type: Serosanguineous Exudate Color: red, brown res Foul Odor After Cleansing: No Slough/Fibrino Yes Wound Bed Granulation Amount: Large (67-100%) Exposed Structure Granulation Quality: Pink, Pale, Hyper-granulation Fascia Exposed: No Necrotic Amount: Small (1-33%) Fat Layer (Subcutaneous Tissue) Exposed: Yes Necrotic Quality: Adherent Slough Tendon Exposed: No Muscle Exposed: No Joint Exposed: No Bone Exposed: No Treatment Notes Wound #3 (Metatarsal head fifth) Wound Laterality: Right Cleanser Soap and Water Discharge Instruction: Gently cleanse wound with antibacterial soap, rinse and pat dry prior to dressing wounds Peri-Wound Care BARBARA, AHART ([id number]) Topical Primary Dressing Prisma 4.34 (in)  Discharge Instruction: Moisten w/normal saline or sterile water; Cover wound as directed. Do not remove from wound bed. Secondary Dressing Mepilex Border Flex, 4x4 (in/in) Discharge Instruction: Apply to wound as directed. Do not cut. Secured With Compression  Wrap Compression Stockings Add-Ons Electronic Signature(s) Signed: 04/27/2020 2:21:33 PM By: Georges Mouse, Minus Breeding RN Entered By: Georges Mouse, Minus Breeding on 04/27/2020 13:48:38 [name] ([id number]) -------------------------------------------------------------------------------- Vitals Details Patient Name: [name] Date of Service: 04/27/2020 1:30 PM Medical Record Number: [id number] Patient Account Number: [id number] Date of Birth/Sex: 1930/03/31 (87 y.o. F) Treating RN: Dolan Amen Primary Care Dyonna Jaspers: Ria Bush Other Clinician: Referring Zenas Santa: Ria Bush Treating Sasuke Yaffe/Extender: Skipper Cliche in Treatment: 28 Vital Signs Time Taken: 13:39 Temperature (F): 98.5 Height (in): 62 Pulse (bpm): 70 Weight (lbs): 140 Respiratory Rate (breaths/min): 18 Body Mass Index (BMI): 25.6 Blood Pressure (mmHg): 168/70 Reference Range: 80 - 120 mg / dl Electronic Signature(s) Signed: 04/27/2020 2:21:33 PM By: Georges Mouse, Minus Breeding RN Entered By: Georges Mouse, Minus Breeding on 04/27/2020 13:41:44

## 2020-04-27 NOTE — Telephone Encounter (Signed)
Pt daugther called in wanted to check on the form she dropped off a weeks ago [place] to [place].

## 2020-04-27 NOTE — Progress Notes (Addendum)
Crystal, TURSO ([id number]) Visit Report for 04/27/2020 Chief Complaint Document Details Patient Name: [name], [name]. Date of Service: 04/27/2020 1:30 PM Medical Record Number: [id number] Patient Account Number: [id number] Date of Birth/Sex: 07/11/1929 (87 y.o. F) Treating RN: Cornell Barman Primary Care Provider: Ria Bush Other Clinician: Referring Provider: Ria Bush Treating Provider/Extender: Skipper Cliche in Treatment: 28 Information Obtained from: Patient Chief Complaint Right Lateral Foot Ulcer Electronic Signature(s) Signed: 04/27/2020 1:52:22 PM By: Worthy Keeler PA-C Entered By: Worthy Keeler on 04/27/2020 13:52:22 LAREYNA, FRAVEL ([id number]) -------------------------------------------------------------------------------- HPI Details Patient Name: [name] Date of Service: 04/27/2020 1:30 PM Medical Record Number: [id number] Patient Account Number: [id number] Date of Birth/Sex: [birth year] (87 y.o. F) Treating RN: Cornell Barman Primary Care Provider: Ria Bush Other Clinician: Referring Provider: Ria Bush Treating Provider/Extender: Skipper Cliche in Treatment: 28 History of Present Illness HPI Description: 10/12/2019 upon evaluation today patient appears to be doing somewhat poorly in regard to her wound at this point. She has a ulcer over the right fifth metatarsal region. This has been managed by podiatry up to this point. Unfortunately the patient just does not seem to be making the progress that they were hoping for an subsequently the patient was referred to [place] by [facility] Dr. Posey Pronto. With that being said I did note that the patient does have peripheral vascular disease which has been managed by Dr. Lazaro Arms her most recent angiogram with intervention was on 08/02/2019. The ABIs following were on the right 1.30 with a TBI of 0.61 normal left 1.31 with a TBI of 0.58. Obviously things seem to be doing somewhat  better at this point hopefully enough to allow her to heal. She is on anticoagulant therapy due to atrial fibrillation long-term. With that being said the patient is unfortunately having quite a bit of discomfort at this time secondary to the wound. She also has erythema which is very likely secondary to infection. I am going obtain a culture of the area today once debridement is complete. Apparently the patient also had a x-ray that was performed by Dr. Posey Pronto on 09/21/2019 and this showed that the patient had no obvious signs on x-ray of cortical irregularity and no osteomyelitis obviously noted. With that being said as I discussed with the patient's daughter as well this does not indicate that the patient absolutely has no osteomyelitis as x-rays are only at best 50% helpful in identifying this complication. Nonetheless I do believe that we need to have the patient continue to have this further evaluated specifically I am to recommend that we check into an MRI which I think would be ideal to evaluate whether or not there is osteomyelitis present. 7/16; this is a patient with an ulcer over the right fifth metatarsal head. She [facility] with [facility] She is due to have an MRI of the foot on 7/26. Using Santyl to the wound 11/04/2019 on evaluation today patient appears to be doing okay in regard to her wound at this time. Unfortunately since I last saw her which was actually her second visit here in the clinic she has moved into a new apartment and then subsequently had a fall. She ended up in the hospital she did fracture her left arm and subsequently has also been dealing with a lot of dizziness she has been at [employer]. She tells me that getting in the change the dressing has been somewhat difficult as far as daily dressing  changes are concerned with the Santyl. Nonetheless when they do change it that is what is been used and her daughter-in-law has also been coming in to  help out as well with the dressing changes to try to make sure it gets done daily. Fortunately there is no signs of active infection at this time. No fever chills noted. I do believe the Santyl has been beneficial and is likely still the best way to go at this point for the patient. I did review the patient's MRI which showed some potential for reactive osteitis but did not show any signs of osteomyelitis overtly. That cannot be excluded but again right now it does not appear that is very likely present as well. This is good news. I also did review her culture she has been on the Bactrim she tells me she has taken this and again that was appropriate based on the results of her culture. 11/19/2019 upon evaluation today patient appears to be doing decently well in regard to her wound. Fortunately there is no signs of active infection at this time wound is not significantly larger in fact is roughly about the same at this point. With that being said there is not as much erythema as previously noted I think the infection seems to be under much better control which is great news. Unfortunately she has fallen since I last saw her and broken her left arm she has an appointment with them following the appointment with me. 8/25; patient was worked in acutely today at the request of home health and was concerned about her right heel, generalized erythema in her feet bilaterally. According to her daughter things have been getting worse over the last for 5 days but not precisely from a wound care point of view. Her wound is on the lateral part of the right fifth metatarsal head looks about the same as I remember this. She has developed dryness and flaking of the skin on her plantar feet. As well a very tender area localized over the insertion point of the Achilles tendon. She has restless leg syndrome. Does not specifically offload the area at night. She wears socks at home and does not really give me a pressure on  the right heel issue. I reviewed her vascular status. She underwent her last revascularization on 08/02/2019 by Dr. Lucky Cowboy. This included an angioplasty of the right peroneal artery as well as the right posterior tibial artery. Her last noninvasive arterial study was on 11/23/2019. On the right her ABI was 1.08 TBI of 0.48. On the left ABI at 1.21 with a TBI of 0.54. Family relates that Dr. Lucky Cowboy felt that he had done the best he could and this. We have been using Santyl to the one wound she has on the right lateral fifth metatarsal head. She does not have a new wound today which is the concern that letter coming into the clinic 12/09/2019 on evaluation today patient appears to be doing decently well in regard to her foot ulcer at this point. She does note that with the compression wrap that the home health nurse put on after contacting our clinic that she actually did much better and felt much better. Her heel is also improved. Fortunately there is no signs of active infection at this time. No fevers, chills, nausea, vomiting, or diarrhea. 12/24/2019 upon evaluation today patient appears to be doing somewhat poorly still in regard to her foot. Unfortunately this is just not showing the signs of improvement that I  would like to see it is a little bit cleaner but also appears to be erythematous and I believe there is infection noted at this point. We will get a need to address this in my opinion. I did actually recommend that we switch back to the Iodoflex as well apparently the home health nurse had switch this away that not really what we want to do at this point in my opinion. They were using silver alginate I think most recently. 01/06/2020 upon evaluation today patient actually appears to be doing much better in regard to her wound. We have switched back to the silver alginate after her home health nurse contacted me and honestly I feel like she is doing quite well in that regard. She does have a lot of  drainage and therefore I think that is probably a better option for. With that being said she is tolerating the compression wrap without complication that also seems to be helping. 01/28/2020 on evaluation today patient appears to be doing well with regard to her foot ulcer. I do feel like she is showing signs of improvement which is great news. There is no signs of active infection at this time which is also good news. No fevers, chills, nausea, vomiting, or diarrhea. Unfortunately she did have a fall and sustained an injury to the left upper arm. She has a skin tear here this appears to be doing okay although DEVANEY, LACAYO. ([id number]) the dressing was stuck to this this was an alginate dressing. I really think she probably needs something like Xeroform to try to prevent anything from sticking to this region. Other than that she seems to be doing quite well. 02/11/2020 on evaluation today patient's arm skin tear appears to be completely healed which is great. Unfortunately she is still having issues at this time with her foot although this seems to be showing signs of improvement it still very slow. There is no signs of active infection at this time. 03/06/2020 upon evaluation today patient appears to be doing decently well in regard to her foot ulcer. There is some minimal slough noted on the surface of the wound building up at this point. Otherwise I really feel like she is doing quite nicely with quite a bit of new granulation and epithelial growth. 03/23/2020 upon evaluation today patient appears to be doing well with regards to her wound. She has been tolerating the dressing changes without complication. Fortunately there is no evidence of active infection and overall feel like her foot ulcer is doing excellent at this point. 04/13/2020 on evaluation today patient appears to be doing well with regard to her foot ulcer. She has been tolerating the dressing changes without complication.  Fortunately there is no signs of active infection at this time. Overall I feel like the wound is getting close to complete closure and this appears to be doing quite well in my opinion. 04/27/2020 on evaluation today patient appears to be doing excellent in regard to her wound. She has been tolerating the dressing changes without complication. Fortunately there is no signs of active infection and overall very pleased with where things stand today. In fact I feel like the wound is very close to complete resolution. Electronic Signature(s) Signed: 04/27/2020 2:45:01 PM By: Worthy Keeler PA-C Entered By: Worthy Keeler on 04/27/2020 14:45:01 JUDIANN, PIASCIK ([id number]) -------------------------------------------------------------------------------- Physical Exam Details Patient Name: AYDRIANA, KOSTRZEWA. Date of Service: 04/27/2020 1:30 PM Medical Record Number: [id number] Patient Account Number: [id number] Date of  Birth/Sex: Mar 19, 1930 (87 y.o. F) Treating RN: Cornell Barman Primary Care Provider: Ria Bush Other Clinician: Referring Provider: Ria Bush Treating Provider/Extender: Skipper Cliche in Treatment: 9 Constitutional Well-nourished and well-hydrated in no acute distress. Respiratory normal breathing without difficulty. Psychiatric this patient is able to make decisions and demonstrates good insight into disease process. Alert and Oriented x 3. pleasant and cooperative. Notes Patient's wound bed currently showed signs of good granulation and epithelization. There is some depth to the wound still which is not completely closed but the area is really about a point 1 cm slit which overall is doing very well compared to where we started. I am very pleased in this regard and no sharp debridement was necessary today. Electronic Signature(s) Signed: 04/27/2020 2:46:17 PM By: Worthy Keeler PA-C Entered By: Worthy Keeler on 04/27/2020 14:46:17 [name]  ([id number]) -------------------------------------------------------------------------------- Physician Orders Details Patient Name: [name] Date of Service: 04/27/2020 1:30 PM Medical Record Number: [id number] Patient Account Number: [id number] Date of Birth/Sex: 05-27-1929 (87 y.o. F) Treating RN: Dolan Amen Primary Care Provider: Ria Bush Other Clinician: Referring Provider: Ria Bush Treating Provider/Extender: Skipper Cliche in Treatment: 32 Verbal / Phone Orders: No Diagnosis Coding ICD-10 Coding Code Description I73.89 Other specified peripheral vascular diseases L97.512 Non-pressure chronic ulcer of other part of right foot with fat layer exposed S41.102A Unspecified open wound of left upper arm, initial encounter Z79.01 Long term (current) use of anticoagulants I48.0 Paroxysmal atrial fibrillation B35.3 Tinea pedis Follow-up Appointments o Return Appointment in 3 weeks. Home Health Wound #3 Right Metatarsal head fifth o [place] [facility] [place] for wound care. May utilize formulary equivalent dressing for wound treatment orders unless otherwise specified. Home Health Nurse may visit PRN to address patientos wound care needs. o Scheduled days for dressing changes to be completed; exception, patient has scheduled wound care visit that day. o **Please direct any NON-WOUND related issues/requests for orders to patient's Primary Care Physician. **If current dressing causes regression in wound condition, may D/C ordered dressing product/s and apply Normal Saline Moist Dressing daily until next [place] or Other MD appointment. **Notify [facility] of regression in wound condition at [phone]. Wound Treatment Wound #3 - Metatarsal head fifth Wound Laterality: Right Cleanser: Soap and Water 2 x Per Week/30 Days Discharge Instructions: Gently cleanse wound with antibacterial soap, rinse  and pat dry prior to dressing wounds Primary Dressing: Prisma 4.34 (in) 2 x Per Week/30 Days Discharge Instructions: Moisten w/normal saline or sterile water; Cover wound as directed. Do not remove from wound bed. Secondary Dressing: Mepilex Border Flex, 4x4 (in/in) (Generic) 2 x Per Week/30 Days Discharge Instructions: Apply to wound as directed. Do not cut. Electronic Signature(s) Signed: 04/27/2020 2:21:33 PM By: Georges Mouse, Minus Breeding RN Signed: 04/27/2020 6:06:24 PM By: Worthy Keeler PA-C Entered By: Georges Mouse, Minus Breeding on 04/27/2020 14:01:30 SHAYLENE, WEEMS ([id number]) -------------------------------------------------------------------------------- Problem List Details Patient Name: NICKEYA, TAK. Date of Service: 04/27/2020 1:30 PM Medical Record Number: [id number] Patient Account Number: [id number] Date of Birth/Sex: 02/02/1930 (87 y.o. F) Treating RN: Cornell Barman Primary Care Provider: Ria Bush Other Clinician: Referring Provider: Ria Bush Treating Provider/Extender: Skipper Cliche in Treatment: 28 Active Problems ICD-10 Encounter Code Description Active Date MDM Diagnosis I73.89 Other specified peripheral vascular diseases 10/12/2019 No Yes L97.512 Non-pressure chronic ulcer of other part of right foot with fat layer 10/12/2019 No Yes exposed S41.102A Unspecified open wound of left upper arm, initial  encounter 01/28/2020 No Yes Z79.01 Long term (current) use of anticoagulants 10/12/2019 No Yes I48.0 Paroxysmal atrial fibrillation 10/12/2019 No Yes B35.3 Tinea pedis 12/01/2019 No Yes Inactive Problems Resolved Problems Electronic Signature(s) Signed: 04/27/2020 1:52:02 PM By: Worthy Keeler PA-C Entered By: Worthy Keeler on 04/27/2020 13:52:01 [name] ([id number]) -------------------------------------------------------------------------------- Progress Note Details Patient Name: [name]. Date of Service: 04/27/2020 1:30 PM Medical  Record Number: [id number] Patient Account Number: [id number] Date of Birth/Sex: 23-Jun-1929 (87 y.o. F) Treating RN: Cornell Barman Primary Care Provider: Ria Bush Other Clinician: Referring Provider: Ria Bush Treating Provider/Extender: Skipper Cliche in Treatment: 28 Subjective Chief Complaint Information obtained from Patient Right Lateral Foot Ulcer History of Present Illness (HPI) 10/12/2019 upon evaluation today patient appears to be doing somewhat poorly in regard to her wound at this point. She has a ulcer over the right fifth metatarsal region. This has been managed by podiatry up to this point. Unfortunately the patient just does not seem to be making the progress that they were hoping for an subsequently the patient was referred to [place] by [facility] Dr. Posey Pronto. With that being said I did note that the patient does have peripheral vascular disease which has been managed by Dr. Lazaro Arms her most recent angiogram with intervention was on 08/02/2019. The ABIs following were on the right 1.30 with a TBI of 0.61 normal left 1.31 with a TBI of 0.58. Obviously things seem to be doing somewhat better at this point hopefully enough to allow her to heal. She is on anticoagulant therapy due to atrial fibrillation long-term. With that being said the patient is unfortunately having quite a bit of discomfort at this time secondary to the wound. She also has erythema which is very likely secondary to infection. I am going obtain a culture of the area today once debridement is complete. Apparently the patient also had a x-ray that was performed by Dr. Posey Pronto on 09/21/2019 and this showed that the patient had no obvious signs on x-ray of cortical irregularity and no osteomyelitis obviously noted. With that being said as I discussed with the patient's daughter as well this does not indicate that the patient absolutely has no osteomyelitis as x-rays are only at best 50% helpful in  identifying this complication. Nonetheless I do believe that we need to have the patient continue to have this further evaluated specifically I am to recommend that we check into an MRI which I think would be ideal to evaluate whether or not there is osteomyelitis present. 7/16; this is a patient with an ulcer over the right fifth metatarsal head. She [facility] with Dr[facility]boy [facility]. She is due to have an MRI of the foot on 7/26. Using Santyl to the wound 11/04/2019 on evaluation today patient appears to be doing okay in regard to her wound at this time. Unfortunately since I last saw her which was actually her second visit here in the clinic she has moved into a new apartment and then subsequently had a fall. She ended up in the hospital she did fracture her left arm and subsequently has also been dealing with a lot of dizziness she has been at [employer]. She tells me that getting in the change the dressing has been somewhat difficult as far as daily dressing changes are concerned with the Santyl. Nonetheless when they do change it that is what is been used and her daughter-in-law has also been coming in to help  out as well with the dressing changes to try to make sure it gets done daily. Fortunately there is no signs of active infection at this time. No fever chills noted. I do believe the Santyl has been beneficial and is likely still the best way to go at this point for the patient. I did review the patient's MRI which showed some potential for reactive osteitis but did not show any signs of osteomyelitis overtly. That cannot be excluded but again right now it does not appear that is very likely present as well. This is good news. I also did review her culture she has been on the Bactrim she tells me she has taken this and again that was appropriate based on the results of her culture. 11/19/2019 upon evaluation today patient appears to be doing decently well in regard to her  wound. Fortunately there is no signs of active infection at this time wound is not significantly larger in fact is roughly about the same at this point. With that being said there is not as much erythema as previously noted I think the infection seems to be under much better control which is great news. Unfortunately she has fallen since I last saw her and broken her left arm she has an appointment with them following the appointment with me. 8/25; patient was worked in acutely today at the request of home health and was concerned about her right heel, generalized erythema in her feet bilaterally. According to her daughter things have been getting worse over the last for 5 days but not precisely from a wound care point of view. Her wound is on the lateral part of the right fifth metatarsal head looks about the same as I remember this. She has developed dryness and flaking of the skin on her plantar feet. As well a very tender area localized over the insertion point of the Achilles tendon. She has restless leg syndrome. Does not specifically offload the area at night. She wears socks at home and does not really give me a pressure on the right heel issue. I reviewed her vascular status. She underwent her last revascularization on 08/02/2019 by Dr. Lucky Cowboy. This included an angioplasty of the right peroneal artery as well as the right posterior tibial artery. Her last noninvasive arterial study was on 11/23/2019. On the right her ABI was 1.08 TBI of 0.48. On the left ABI at 1.21 with a TBI of 0.54. Family relates that Dr. Lucky Cowboy felt that he had done the best he could and this. We have been using Santyl to the one wound she has on the right lateral fifth metatarsal head. She does not have a new wound today which is the concern that letter coming into the clinic 12/09/2019 on evaluation today patient appears to be doing decently well in regard to her foot ulcer at this point. She does note that with the compression  wrap that the home health nurse put on after contacting our clinic that she actually did much better and felt much better. Her heel is also improved. Fortunately there is no signs of active infection at this time. No fevers, chills, nausea, vomiting, or diarrhea. 12/24/2019 upon evaluation today patient appears to be doing somewhat poorly still in regard to her foot. Unfortunately this is just not showing the signs of improvement that I would like to see it is a little bit cleaner but also appears to be erythematous and I believe there is infection noted at this point. We will  get a need to address this in my opinion. I did actually recommend that we switch back to the Iodoflex as well apparently the home health nurse had switch this away that not really what we want to do at this point in my opinion. They were using silver alginate I think most recently. 01/06/2020 upon evaluation today patient actually appears to be doing much better in regard to her wound. We have switched back to the silver alginate after her home health nurse contacted me and honestly I feel like she is doing quite well in that regard. She does have a lot of drainage and therefore I think that is probably a better option for. With that being said she is tolerating the compression wrap without complication that KEVIA, ZAUCHA. ([id number]) also seems to be helping. 01/28/2020 on evaluation today patient appears to be doing well with regard to her foot ulcer. I do feel like she is showing signs of improvement which is great news. There is no signs of active infection at this time which is also good news. No fevers, chills, nausea, vomiting, or diarrhea. Unfortunately she did have a fall and sustained an injury to the left upper arm. She has a skin tear here this appears to be doing okay although the dressing was stuck to this this was an alginate dressing. I really think she probably needs something like Xeroform to try to prevent  anything from sticking to this region. Other than that she seems to be doing quite well. 02/11/2020 on evaluation today patient's arm skin tear appears to be completely healed which is great. Unfortunately she is still having issues at this time with her foot although this seems to be showing signs of improvement it still very slow. There is no signs of active infection at this time. 03/06/2020 upon evaluation today patient appears to be doing decently well in regard to her foot ulcer. There is some minimal slough noted on the surface of the wound building up at this point. Otherwise I really feel like she is doing quite nicely with quite a bit of new granulation and epithelial growth. 03/23/2020 upon evaluation today patient appears to be doing well with regards to her wound. She has been tolerating the dressing changes without complication. Fortunately there is no evidence of active infection and overall feel like her foot ulcer is doing excellent at this point. 04/13/2020 on evaluation today patient appears to be doing well with regard to her foot ulcer. She has been tolerating the dressing changes without complication. Fortunately there is no signs of active infection at this time. Overall I feel like the wound is getting close to complete closure and this appears to be doing quite well in my opinion. 04/27/2020 on evaluation today patient appears to be doing excellent in regard to her wound. She has been tolerating the dressing changes without complication. Fortunately there is no signs of active infection and overall very pleased with where things stand today. In fact I feel like the wound is very close to complete resolution. Objective Constitutional Well-nourished and well-hydrated in no acute distress. Vitals Time Taken: 1:39 PM, Height: 62 in, Weight: 140 lbs, BMI: 25.6, Temperature: 98.5 F, Pulse: 70 bpm, Respiratory Rate: 18 breaths/min, Blood Pressure: 168/70 mmHg. Respiratory normal  breathing without difficulty. Psychiatric this patient is able to make decisions and demonstrates good insight into disease process. Alert and Oriented x 3. pleasant and cooperative. General Notes: Patient's wound bed currently showed signs of good granulation and  epithelization. There is some depth to the wound still which is not completely closed but the area is really about a point 1 cm slit which overall is doing very well compared to where we started. I am very pleased in this regard and no sharp debridement was necessary today. Integumentary (Hair, Skin) Wound #3 status is Open. Original cause of wound was Gradually Appeared. The wound is located on the Right Metatarsal head fifth. The wound measures 0.3cm length x 0.4cm width x 0.3cm depth; 0.094cm^2 area and 0.028cm^3 volume. There is Fat Layer (Subcutaneous Tissue) exposed. There is no tunneling or undermining noted. There is a medium amount of serosanguineous drainage noted. The wound margin is distinct with the outline attached to the wound base. There is large (67-100%) pink, pale, hyper - granulation within the wound bed. There is a small (1-33%) amount of necrotic tissue within the wound bed including Adherent Slough. Assessment Active Problems ICD-10 Other specified peripheral vascular diseases Non-pressure chronic ulcer of other part of right foot with fat layer exposed Unspecified open wound of left upper arm, initial encounter Long term (current) use of anticoagulants Paroxysmal atrial fibrillation Tinea pedis LAKYSHA, KOSSMAN. ([id number]) Plan Follow-up Appointments: Return Appointment in 3 weeks. Home Health: Wound #3 Right Metatarsal head fifth: [place]: - [place] for wound care. May utilize formulary equivalent dressing for wound treatment orders unless otherwise specified. Home Health Nurse may visit PRN to address patient s wound care needs. Scheduled days for dressing changes to  be completed; exception, patient has scheduled wound care visit that day. **Please direct any NON-WOUND related issues/requests for orders to patient's Primary Care Physician. **If current dressing causes regression in wound condition, may D/C ordered dressing product/s and apply Normal Saline Moist Dressing daily until next [place] or Other MD appointment. **Notify [facility] of regression in wound condition at [phone]. WOUND #3: - Metatarsal head fifth Wound Laterality: Right Cleanser: Soap and Water 2 x Per Week/30 Days Discharge Instructions: Gently cleanse wound with antibacterial soap, rinse and pat dry prior to dressing wounds Primary Dressing: Prisma 4.34 (in) 2 x Per Week/30 Days Discharge Instructions: Moisten w/normal saline or sterile water; Cover wound as directed. Do not remove from wound bed. Secondary Dressing: Mepilex Border Flex, 4x4 (in/in) (Generic) 2 x Per Week/30 Days Discharge Instructions: Apply to wound as directed. Do not cut. 1. I would recommend currently that we going continue with the wound care measures as before and the patient is in agreement with that plan. This includes the use of silver collagen which I think has done well for the patient up to this point. I would recommend as such that we continue with this. 2. I am also can recommend currently that we have the patient continue to be monitored for any signs of infection though I see nothing right now that seems to be an issue we will keep a close eye on everything. We will see patient back for reevaluation in 3 weeks here in the clinic. If anything worsens or changes patient will contact our office for additional recommendations. Electronic Signature(s) Signed: 04/27/2020 2:47:09 PM By: Worthy Keeler PA-C Entered By: Worthy Keeler on 04/27/2020 14:47:08 [name] ([id number]) -------------------------------------------------------------------------------- SuperBill  Details Patient Name: [name] Date of Service: 04/27/2020 Medical Record Number: [id number] Patient Account Number: [id number] Date of Birth/Sex: [birth year] (87 y.o. F) Treating RN: Dolan Amen Primary Care Provider: Ria Bush Other Clinician: Referring Provider: Ria Bush Treating  Provider/Extender: Jeri Cos Weeks in Treatment: 28 Diagnosis Coding ICD-10 Codes Code Description I73.89 Other specified peripheral vascular diseases L97.512 Non-pressure chronic ulcer of other part of right foot with fat layer exposed S41.102A Unspecified open wound of left upper arm, initial encounter Z79.01 Long term (current) use of anticoagulants I48.0 Paroxysmal atrial fibrillation B35.3 Tinea pedis Facility Procedures CPT4 Code: [id number] Description: 99213 - WOUND CARE VISIT-LEV 3 EST PT Modifier: Quantity: 1 Physician Procedures CPT4 Code: [id number] Description: [id number] - WC PHYS LEVEL 3 - EST PT Modifier: Quantity: 1 CPT4 Code: Description: ICD-10 Diagnosis Description I73.89 Other specified peripheral vascular diseases L97.512 Non-pressure chronic ulcer of other part of right foot with fat layer e S41.102A Unspecified open wound of left upper arm, initial encounter Z79.01 Long  term (current) use of anticoagulants Modifier: xposed Quantity: Electronic Signature(s) Signed: 04/27/2020 2:47:31 PM By: Worthy Keeler PA-C Previous Signature: 04/27/2020 2:21:33 PM Version By: Georges Mouse, Minus Breeding RN Entered By: Worthy Keeler on 04/27/2020 14:47:31

## 2020-04-28 NOTE — Telephone Encounter (Signed)
ERx 

## 2020-04-28 NOTE — Telephone Encounter (Signed)
Dr. G, do you have this form? ?

## 2020-04-28 NOTE — Telephone Encounter (Signed)
Spoke with pt's daughter, [name] (on dpr), relaying Dr. Synthia Innocent message.  She verbalizes understanding and expresses her thanks.

## 2020-04-28 NOTE — Telephone Encounter (Signed)
Thank you for checking in on this - it will be ready for pick up on Monday.

## 2020-05-01 DIAGNOSIS — M7062 Trochanteric bursitis, left hip: Secondary | ICD-10-CM | POA: Diagnosis not present

## 2020-05-01 DIAGNOSIS — M5416 Radiculopathy, lumbar region: Secondary | ICD-10-CM | POA: Diagnosis not present

## 2020-05-01 DIAGNOSIS — M62830 Muscle spasm of back: Secondary | ICD-10-CM | POA: Diagnosis not present

## 2020-05-01 DIAGNOSIS — M5136 Other intervertebral disc degeneration, lumbar region: Secondary | ICD-10-CM | POA: Diagnosis not present

## 2020-05-01 DIAGNOSIS — M48062 Spinal stenosis, lumbar region with neurogenic claudication: Secondary | ICD-10-CM | POA: Diagnosis not present

## 2020-05-01 NOTE — Telephone Encounter (Signed)
Form filled and in [name]'s box.  

## 2020-05-01 NOTE — Telephone Encounter (Signed)
Placed form at front office- yellow folders.  There is a $20.00 fee to complete form.   Made copy to scan and one for billing.

## 2020-05-02 DIAGNOSIS — I872 Venous insufficiency (chronic) (peripheral): Secondary | ICD-10-CM | POA: Diagnosis not present

## 2020-05-02 DIAGNOSIS — S62102D Fracture of unspecified carpal bone, left wrist, subsequent encounter for fracture with routine healing: Secondary | ICD-10-CM | POA: Diagnosis not present

## 2020-05-02 DIAGNOSIS — M810 Age-related osteoporosis without current pathological fracture: Secondary | ICD-10-CM | POA: Diagnosis not present

## 2020-05-02 DIAGNOSIS — I2510 Atherosclerotic heart disease of native coronary artery without angina pectoris: Secondary | ICD-10-CM | POA: Diagnosis not present

## 2020-05-02 DIAGNOSIS — G2581 Restless legs syndrome: Secondary | ICD-10-CM | POA: Diagnosis not present

## 2020-05-02 DIAGNOSIS — E538 Deficiency of other specified B group vitamins: Secondary | ICD-10-CM | POA: Diagnosis not present

## 2020-05-02 DIAGNOSIS — E7849 Other hyperlipidemia: Secondary | ICD-10-CM | POA: Diagnosis not present

## 2020-05-02 DIAGNOSIS — E559 Vitamin D deficiency, unspecified: Secondary | ICD-10-CM | POA: Diagnosis not present

## 2020-05-02 DIAGNOSIS — Z9181 History of falling: Secondary | ICD-10-CM | POA: Diagnosis not present

## 2020-05-02 DIAGNOSIS — I739 Peripheral vascular disease, unspecified: Secondary | ICD-10-CM | POA: Diagnosis not present

## 2020-05-02 DIAGNOSIS — Z7982 Long term (current) use of aspirin: Secondary | ICD-10-CM | POA: Diagnosis not present

## 2020-05-02 DIAGNOSIS — G9009 Other idiopathic peripheral autonomic neuropathy: Secondary | ICD-10-CM | POA: Diagnosis not present

## 2020-05-02 DIAGNOSIS — Z792 Long term (current) use of antibiotics: Secondary | ICD-10-CM | POA: Diagnosis not present

## 2020-05-02 DIAGNOSIS — N1831 Chronic kidney disease, stage 3a: Secondary | ICD-10-CM | POA: Diagnosis not present

## 2020-05-02 DIAGNOSIS — I4891 Unspecified atrial fibrillation: Secondary | ICD-10-CM | POA: Diagnosis not present

## 2020-05-02 DIAGNOSIS — I10 Essential (primary) hypertension: Secondary | ICD-10-CM | POA: Diagnosis not present

## 2020-05-02 DIAGNOSIS — Z7901 Long term (current) use of anticoagulants: Secondary | ICD-10-CM | POA: Diagnosis not present

## 2020-05-02 DIAGNOSIS — I130 Hypertensive heart and chronic kidney disease with heart failure and stage 1 through stage 4 chronic kidney disease, or unspecified chronic kidney disease: Secondary | ICD-10-CM | POA: Diagnosis not present

## 2020-05-02 DIAGNOSIS — I5032 Chronic diastolic (congestive) heart failure: Secondary | ICD-10-CM | POA: Diagnosis not present

## 2020-05-02 DIAGNOSIS — I272 Pulmonary hypertension, unspecified: Secondary | ICD-10-CM | POA: Diagnosis not present

## 2020-05-02 DIAGNOSIS — M1990 Unspecified osteoarthritis, unspecified site: Secondary | ICD-10-CM | POA: Diagnosis not present

## 2020-05-02 DIAGNOSIS — M5136 Other intervertebral disc degeneration, lumbar region: Secondary | ICD-10-CM | POA: Diagnosis not present

## 2020-05-02 DIAGNOSIS — K219 Gastro-esophageal reflux disease without esophagitis: Secondary | ICD-10-CM | POA: Diagnosis not present

## 2020-05-02 DIAGNOSIS — L97513 Non-pressure chronic ulcer of other part of right foot with necrosis of muscle: Secondary | ICD-10-CM | POA: Diagnosis not present

## 2020-05-03 ENCOUNTER — Ambulatory Visit (INDEPENDENT_AMBULATORY_CARE_PROVIDER_SITE_OTHER): Payer: Medicare Other

## 2020-05-03 DIAGNOSIS — G9009 Other idiopathic peripheral autonomic neuropathy: Secondary | ICD-10-CM | POA: Diagnosis not present

## 2020-05-03 DIAGNOSIS — N1831 Chronic kidney disease, stage 3a: Secondary | ICD-10-CM | POA: Diagnosis not present

## 2020-05-03 DIAGNOSIS — E559 Vitamin D deficiency, unspecified: Secondary | ICD-10-CM | POA: Diagnosis not present

## 2020-05-03 DIAGNOSIS — I739 Peripheral vascular disease, unspecified: Secondary | ICD-10-CM | POA: Diagnosis not present

## 2020-05-03 DIAGNOSIS — K219 Gastro-esophageal reflux disease without esophagitis: Secondary | ICD-10-CM | POA: Diagnosis not present

## 2020-05-03 DIAGNOSIS — S62102D Fracture of unspecified carpal bone, left wrist, subsequent encounter for fracture with routine healing: Secondary | ICD-10-CM | POA: Diagnosis not present

## 2020-05-03 DIAGNOSIS — M5136 Other intervertebral disc degeneration, lumbar region: Secondary | ICD-10-CM | POA: Diagnosis not present

## 2020-05-03 DIAGNOSIS — Z7901 Long term (current) use of anticoagulants: Secondary | ICD-10-CM | POA: Diagnosis not present

## 2020-05-03 DIAGNOSIS — I272 Pulmonary hypertension, unspecified: Secondary | ICD-10-CM | POA: Diagnosis not present

## 2020-05-03 DIAGNOSIS — Z7982 Long term (current) use of aspirin: Secondary | ICD-10-CM | POA: Diagnosis not present

## 2020-05-03 DIAGNOSIS — E7849 Other hyperlipidemia: Secondary | ICD-10-CM | POA: Diagnosis not present

## 2020-05-03 DIAGNOSIS — I872 Venous insufficiency (chronic) (peripheral): Secondary | ICD-10-CM | POA: Diagnosis not present

## 2020-05-03 DIAGNOSIS — I10 Essential (primary) hypertension: Secondary | ICD-10-CM | POA: Diagnosis not present

## 2020-05-03 DIAGNOSIS — Z792 Long term (current) use of antibiotics: Secondary | ICD-10-CM | POA: Diagnosis not present

## 2020-05-03 DIAGNOSIS — I5032 Chronic diastolic (congestive) heart failure: Secondary | ICD-10-CM | POA: Diagnosis not present

## 2020-05-03 DIAGNOSIS — Z9181 History of falling: Secondary | ICD-10-CM | POA: Diagnosis not present

## 2020-05-03 DIAGNOSIS — I130 Hypertensive heart and chronic kidney disease with heart failure and stage 1 through stage 4 chronic kidney disease, or unspecified chronic kidney disease: Secondary | ICD-10-CM | POA: Diagnosis not present

## 2020-05-03 DIAGNOSIS — I4891 Unspecified atrial fibrillation: Secondary | ICD-10-CM | POA: Diagnosis not present

## 2020-05-03 DIAGNOSIS — M1990 Unspecified osteoarthritis, unspecified site: Secondary | ICD-10-CM | POA: Diagnosis not present

## 2020-05-03 DIAGNOSIS — E538 Deficiency of other specified B group vitamins: Secondary | ICD-10-CM | POA: Diagnosis not present

## 2020-05-03 DIAGNOSIS — G2581 Restless legs syndrome: Secondary | ICD-10-CM | POA: Diagnosis not present

## 2020-05-03 DIAGNOSIS — M810 Age-related osteoporosis without current pathological fracture: Secondary | ICD-10-CM | POA: Diagnosis not present

## 2020-05-03 DIAGNOSIS — I2510 Atherosclerotic heart disease of native coronary artery without angina pectoris: Secondary | ICD-10-CM | POA: Diagnosis not present

## 2020-05-03 DIAGNOSIS — L97513 Non-pressure chronic ulcer of other part of right foot with necrosis of muscle: Secondary | ICD-10-CM | POA: Diagnosis not present

## 2020-05-03 MED ORDER — CYANOCOBALAMIN 1000 MCG/ML IJ SOLN
1000.0000 ug | Freq: Once | INTRAMUSCULAR | Status: AC
  Administered 2020-05-03: 1000 ug via INTRAMUSCULAR

## 2020-05-03 NOTE — Progress Notes (Signed)
Per orders of Dr. Danise Mina, monthly injection of B12, in R deltoid, given by Loreen Freud. Patient tolerated injection well.

## 2020-05-04 ENCOUNTER — Other Ambulatory Visit: Payer: Self-pay

## 2020-05-04 ENCOUNTER — Other Ambulatory Visit: Payer: Medicare Other | Admitting: Adult Health Nurse Practitioner

## 2020-05-04 DIAGNOSIS — G9009 Other idiopathic peripheral autonomic neuropathy: Secondary | ICD-10-CM | POA: Diagnosis not present

## 2020-05-04 DIAGNOSIS — I2510 Atherosclerotic heart disease of native coronary artery without angina pectoris: Secondary | ICD-10-CM | POA: Diagnosis not present

## 2020-05-04 DIAGNOSIS — L97513 Non-pressure chronic ulcer of other part of right foot with necrosis of muscle: Secondary | ICD-10-CM | POA: Diagnosis not present

## 2020-05-04 DIAGNOSIS — I130 Hypertensive heart and chronic kidney disease with heart failure and stage 1 through stage 4 chronic kidney disease, or unspecified chronic kidney disease: Secondary | ICD-10-CM | POA: Diagnosis not present

## 2020-05-04 DIAGNOSIS — I5032 Chronic diastolic (congestive) heart failure: Secondary | ICD-10-CM | POA: Diagnosis not present

## 2020-05-04 DIAGNOSIS — Z792 Long term (current) use of antibiotics: Secondary | ICD-10-CM | POA: Diagnosis not present

## 2020-05-04 DIAGNOSIS — G6289 Other specified polyneuropathies: Secondary | ICD-10-CM

## 2020-05-04 DIAGNOSIS — Z7982 Long term (current) use of aspirin: Secondary | ICD-10-CM | POA: Diagnosis not present

## 2020-05-04 DIAGNOSIS — Z515 Encounter for palliative care: Secondary | ICD-10-CM | POA: Diagnosis not present

## 2020-05-04 DIAGNOSIS — I10 Essential (primary) hypertension: Secondary | ICD-10-CM | POA: Diagnosis not present

## 2020-05-04 DIAGNOSIS — M5136 Other intervertebral disc degeneration, lumbar region: Secondary | ICD-10-CM | POA: Diagnosis not present

## 2020-05-04 DIAGNOSIS — E559 Vitamin D deficiency, unspecified: Secondary | ICD-10-CM | POA: Diagnosis not present

## 2020-05-04 DIAGNOSIS — K219 Gastro-esophageal reflux disease without esophagitis: Secondary | ICD-10-CM | POA: Diagnosis not present

## 2020-05-04 DIAGNOSIS — S62102D Fracture of unspecified carpal bone, left wrist, subsequent encounter for fracture with routine healing: Secondary | ICD-10-CM | POA: Diagnosis not present

## 2020-05-04 DIAGNOSIS — I872 Venous insufficiency (chronic) (peripheral): Secondary | ICD-10-CM | POA: Diagnosis not present

## 2020-05-04 DIAGNOSIS — Z7901 Long term (current) use of anticoagulants: Secondary | ICD-10-CM | POA: Diagnosis not present

## 2020-05-04 DIAGNOSIS — E538 Deficiency of other specified B group vitamins: Secondary | ICD-10-CM | POA: Diagnosis not present

## 2020-05-04 DIAGNOSIS — N1831 Chronic kidney disease, stage 3a: Secondary | ICD-10-CM | POA: Diagnosis not present

## 2020-05-04 DIAGNOSIS — E7849 Other hyperlipidemia: Secondary | ICD-10-CM | POA: Diagnosis not present

## 2020-05-04 DIAGNOSIS — F0390 Unspecified dementia without behavioral disturbance: Secondary | ICD-10-CM

## 2020-05-04 DIAGNOSIS — L97519 Non-pressure chronic ulcer of other part of right foot with unspecified severity: Secondary | ICD-10-CM | POA: Diagnosis not present

## 2020-05-04 DIAGNOSIS — M1990 Unspecified osteoarthritis, unspecified site: Secondary | ICD-10-CM | POA: Diagnosis not present

## 2020-05-04 DIAGNOSIS — M810 Age-related osteoporosis without current pathological fracture: Secondary | ICD-10-CM | POA: Diagnosis not present

## 2020-05-04 DIAGNOSIS — Z9181 History of falling: Secondary | ICD-10-CM | POA: Diagnosis not present

## 2020-05-04 DIAGNOSIS — I739 Peripheral vascular disease, unspecified: Secondary | ICD-10-CM | POA: Diagnosis not present

## 2020-05-04 DIAGNOSIS — I272 Pulmonary hypertension, unspecified: Secondary | ICD-10-CM | POA: Diagnosis not present

## 2020-05-04 DIAGNOSIS — G2581 Restless legs syndrome: Secondary | ICD-10-CM | POA: Diagnosis not present

## 2020-05-04 DIAGNOSIS — I4891 Unspecified atrial fibrillation: Secondary | ICD-10-CM | POA: Diagnosis not present

## 2020-05-04 NOTE — Progress Notes (Signed)
[facility] Consult Note Telephone: [phone]  Fax: [phone]  PATIENT NAME: [name] DOB: [birth year] MRN: [id number]  PRIMARY CARE PROVIDER:   Ria Bush, MD  REFERRING PROVIDER:  Ria Bush, MD [address] [place],  [place] [address]  RESPONSIBLE PARTY:   [name], daughter [phone]  Chief complaint:  Follow up palliative visit/neuropathy   RECOMMENDATIONS and PLAN: 1.Advanced care planning. Patient is DNR  2.  CHF.  Patient doing well with current regimen.  No edema or increased SOB or cough.  Continue follow up and recommendations by cardiology  3.  Dementia. Patient recently diagnosed with mild dementia.  Has been started on Namenda.  Likes to read and encouraged to do activities like reading and puzzles to engage her mind  4. Neuropathy.  Patient on requip 5 mg QHS, gabapentin 300 mg QHS, and has tramadol 50 mg 1-2 tabs QHS PRN.  Having more pain in mornings.  Have encouraged to take tramadol 1 tab at night and 1 tab in the morning to see if this would give more consistent pain relief.  Have also suggested magnesium oil or cream that she can rub on her legs as she does complain of muscle spasms in her legs  5.  Wound. Has wound to right lateral foot that is improving.  She is being followed by wound clinic and has home health nurse that comes in twice a week for wound care.  Services through home health are scheduled to end next week.  RN present at visit today and is unsure if services will be continued as the wound has improved.  Continue follow up and recommendations at wound clinic.  6.  Support.  Daughter has been trying to get a caregiver to come in to help her mother in the mornings.  Have had issues with personality clashes and reliability through private caregivers.  Have encouraged working with an agency.  She has a list of agencies she can reach out to  I spent 60 minutes  providing this consultation. More than 50% of the time in this consultation was spent coordinating communication.   HISTORY OF PRESENT ILLNESS:  [name] is a 87 y.o. year old female with multiple medical problems including mild dementia, CHF, HTN, PVD with chronic ulcer of right foot, peripheral neuropathy, GERD, HLD, A. fib, RLS, CAD, CKD D stage III. Palliative Care was asked to help address goals of care. Patient is having more pain in legs related to neuropathy.  States that it has been bothering her more in the morning. Currently is on gabapentin 300 mg QHS, requip 5mg  QHS, and tramadol 50 mg 1-2 tabs QHS PRN.  Has been taking 1 tab of the tramadol and started using 2 tabs at night recently.  States that rubbing her legs does not help.  Has back pain that is relieved with joint injections that she gets benefit getting every 3 months.  She is being followed with ortho for this.  Patient did have ED visit on 04/02/84 for a nose bleed that she could not control at home.  Bleeding was controlled in ED and she was discharged with follow up with ENT.  Has not had any more nose bleeds.  Started using humidifier.  Rest of 10 point ROS asked and negative.    CODE STATUS: DNR  PPS: 60% HOSPICE ELIGIBILITY/DIAGNOSIS: TBD  PHYSICAL EXAM:  BP 132/70 HR 82 O2 98% on RA temp 99 General: NAD,  frail appearing Eyes: sclera anicteric and noninjected with no discharge noted Cardiovascular: regular rate and rhythm Pulmonary: lung sounds clear; normal respiratory effort Abdomen: soft, nontender, + bowel sounds Extremities: no edema, no joint deformities Skin: no rashes on exposed skin. Wound to right lateral foot is slightly bigger than the size of a quarter and has scabbing noted Neurological: Weakness but otherwise nonfocal; has forgetfulness   PAST MEDICAL HISTORY:  Past Medical History:  Diagnosis Date  . Abdominal aortic atherosclerosis ([place]) 09/2015   by xray  . Anxiety   . BCC (basal cell  carcinoma of skin) 05/2011   on nose  . Breast lesion    a. diagnosed with complex sclerosing lesion with calcifications of the right breast in 08/2016  . Chest pain    a. nuclear stress test 03/2012: normal; b. 09/2018 MV: EF >65%, no isch/scar.   . DDD (degenerative disc disease), lumbosacral 12/06/1992  . Gallstones 09/2015   incidentally by xray  . GERD (gastroesophageal reflux disease) 05/1999  . HERPES ZOSTER 04/13/2007   Qualifier: Diagnosis of  By: [name], [name]   . Hiatal hernia   . History of shingles    ophthalmic, takes acyclovir daily  . Hyperlipidemia 12/2003  . Hypertension 1990  . Mitral regurgitation    a. echo 03/2012: EF 60-65%, no RWMA, mild to mod AI/MR, mod TR, PASP 37 mmHg  . Osteoarthritis 08/21/1989  . Osteoporosis with fracture 11/1997   with compression fracture T12  . Persistent atrial fibrillation ([place]) 03/16/2012   a. CHADS2VASc => 5 (HTN, age x 2, vascular disease, sex category)-->Eliquis 5 BID;  b. Recurrent AF in 06/2016;  c. 01/2017 s/p DCCV;  d. 02/2017 recurrent AFib->Amio added->DCCV; e. 03/2017 recurrent AFib, s/p reload of amio and DCCV 04/02/2017  . POSTHERPETIC NEURALGIA 04/20/2007   Qualifier: Diagnosis of  By: [name], [name]   . Rheumatoid arthritis ([place])   . RLS (restless legs syndrome)     SOCIAL HX:  Social History   Tobacco Use  . Smoking status: Former Smoker    Packs/day: 0.25    Years: 1.00    Pack years: 0.25    Quit date: 04/08/1976    Years since quitting: 44.1  . Smokeless tobacco: Never Used  . Tobacco comment: pt smokes occasionally "socially"  Substance Use Topics  . Alcohol use: No    Alcohol/week: 1.0 standard drink    Types: 1 Glasses of wine per week    ALLERGIES:  Allergies  Allergen Reactions  . Penicillins Rash and Other (See Comments)    Childhood Allergy Has patient had a PCN reaction causing immediate rash, facial/tongue/throat swelling, SOB or lightheadedness with hypotension:  Yes Has patient had a PCN reaction causing severe rash involving mucus membranes or skin necrosis: Unknown Has patient had a PCN reaction that required hospitalization: No Has patient had a PCN reaction occurring within the last 10 years: No If all of the above answers are "NO", then may proceed with Cephalosporin use.      PERTINENT MEDICATIONS:  Outpatient Encounter Medications as of 05/04/2020  Medication Sig  . acetaminophen (TYLENOL) 500 MG tablet Take 1,000 mg every 8 (eight) hours as needed by mouth for moderate pain.   Marland Kitchen acyclovir (ZOVIRAX) 400 MG tablet Take 400 mg by mouth 2 (two) times daily.  Marland Kitchen amiodarone (PACERONE) 100 MG tablet Take 1 tablet (100 mg total) by mouth daily. (Patient taking differently: Take 100 mg by mouth daily. Takes 1/2 tablet in morning.)  .  aspirin EC 81 MG tablet Take 1 tablet (81 mg total) by mouth daily.  Marland Kitchen atorvastatin (LIPITOR) 20 MG tablet TAKE 1 TABLET BY MOUTH ONCE DAILY  . DULoxetine (CYMBALTA) 60 MG capsule TAKE 1 CAPSULE BY MOUTH ONCE DAILY  . ELIQUIS 2.5 MG TABS tablet TAKE 1 TABLET BY MOUTH TWICE DAILY (Patient taking differently: Take 2.5 mg by mouth 2 (two) times daily.)  . gabapentin (NEURONTIN) 300 MG capsule Take 1 capsule (300 mg total) by mouth at bedtime. With second dose as needed  . lisinopril (ZESTRIL) 10 MG tablet Take 1 tablet (10 mg total) by mouth daily.  . memantine (NAMENDA) 5 MG tablet TAKE 1 TABLET BY MOUTH TWICE DAILY  . metoprolol tartrate (LOPRESSOR) 25 MG tablet TAKE 1/2 TABLET BY MOUTH TWICE DAILY  . pantoprazole (PROTONIX) 40 MG tablet TAKE 1 TABLET BY MOUTH EVERY DAY  . polyethylene glycol (MIRALAX / GLYCOLAX) 17 g packet Take 17 g by mouth daily as needed for mild constipation.  . prednisoLONE acetate (PRED FORTE) 1 % ophthalmic suspension Place 1 drop into the left eye daily.   Marland Kitchen rOPINIRole (REQUIP) 1 MG tablet Take 2.5 tablets (2.5 mg total) by mouth at bedtime.  . senna (SENOKOT) 8.6 MG TABS tablet Take 1 tablet (8.6  mg total) by mouth daily as needed for mild constipation.  . traMADol (ULTRAM) 50 MG tablet Take 50 mg by mouth daily. Takes 2 tablets daily  . Vitamin D, Ergocalciferol, (DRISDOL) 1.25 MG (50000 UNIT) CAPS capsule TAKE 1 CAPSULE BY MOUTH ONCE WEEKLY   No facility-administered encounter medications on file as of 05/04/2020.     Shandreka Dante Jenetta Downer, NP

## 2020-05-08 DIAGNOSIS — Z7901 Long term (current) use of anticoagulants: Secondary | ICD-10-CM | POA: Diagnosis not present

## 2020-05-08 DIAGNOSIS — M810 Age-related osteoporosis without current pathological fracture: Secondary | ICD-10-CM | POA: Diagnosis not present

## 2020-05-08 DIAGNOSIS — L97513 Non-pressure chronic ulcer of other part of right foot with necrosis of muscle: Secondary | ICD-10-CM | POA: Diagnosis not present

## 2020-05-08 DIAGNOSIS — Z9181 History of falling: Secondary | ICD-10-CM | POA: Diagnosis not present

## 2020-05-08 DIAGNOSIS — K219 Gastro-esophageal reflux disease without esophagitis: Secondary | ICD-10-CM | POA: Diagnosis not present

## 2020-05-08 DIAGNOSIS — G9009 Other idiopathic peripheral autonomic neuropathy: Secondary | ICD-10-CM | POA: Diagnosis not present

## 2020-05-08 DIAGNOSIS — I130 Hypertensive heart and chronic kidney disease with heart failure and stage 1 through stage 4 chronic kidney disease, or unspecified chronic kidney disease: Secondary | ICD-10-CM | POA: Diagnosis not present

## 2020-05-08 DIAGNOSIS — I4891 Unspecified atrial fibrillation: Secondary | ICD-10-CM | POA: Diagnosis not present

## 2020-05-08 DIAGNOSIS — Z792 Long term (current) use of antibiotics: Secondary | ICD-10-CM | POA: Diagnosis not present

## 2020-05-08 DIAGNOSIS — I872 Venous insufficiency (chronic) (peripheral): Secondary | ICD-10-CM | POA: Diagnosis not present

## 2020-05-08 DIAGNOSIS — I10 Essential (primary) hypertension: Secondary | ICD-10-CM | POA: Diagnosis not present

## 2020-05-08 DIAGNOSIS — E559 Vitamin D deficiency, unspecified: Secondary | ICD-10-CM | POA: Diagnosis not present

## 2020-05-08 DIAGNOSIS — S62102D Fracture of unspecified carpal bone, left wrist, subsequent encounter for fracture with routine healing: Secondary | ICD-10-CM | POA: Diagnosis not present

## 2020-05-08 DIAGNOSIS — M5136 Other intervertebral disc degeneration, lumbar region: Secondary | ICD-10-CM | POA: Diagnosis not present

## 2020-05-08 DIAGNOSIS — E7849 Other hyperlipidemia: Secondary | ICD-10-CM | POA: Diagnosis not present

## 2020-05-08 DIAGNOSIS — I272 Pulmonary hypertension, unspecified: Secondary | ICD-10-CM | POA: Diagnosis not present

## 2020-05-08 DIAGNOSIS — I5032 Chronic diastolic (congestive) heart failure: Secondary | ICD-10-CM | POA: Diagnosis not present

## 2020-05-08 DIAGNOSIS — M1990 Unspecified osteoarthritis, unspecified site: Secondary | ICD-10-CM | POA: Diagnosis not present

## 2020-05-08 DIAGNOSIS — I739 Peripheral vascular disease, unspecified: Secondary | ICD-10-CM | POA: Diagnosis not present

## 2020-05-08 DIAGNOSIS — I2510 Atherosclerotic heart disease of native coronary artery without angina pectoris: Secondary | ICD-10-CM | POA: Diagnosis not present

## 2020-05-08 DIAGNOSIS — E538 Deficiency of other specified B group vitamins: Secondary | ICD-10-CM | POA: Diagnosis not present

## 2020-05-08 DIAGNOSIS — N1831 Chronic kidney disease, stage 3a: Secondary | ICD-10-CM | POA: Diagnosis not present

## 2020-05-08 DIAGNOSIS — G2581 Restless legs syndrome: Secondary | ICD-10-CM | POA: Diagnosis not present

## 2020-05-08 DIAGNOSIS — Z7982 Long term (current) use of aspirin: Secondary | ICD-10-CM | POA: Diagnosis not present

## 2020-05-10 DIAGNOSIS — I872 Venous insufficiency (chronic) (peripheral): Secondary | ICD-10-CM | POA: Diagnosis not present

## 2020-05-10 DIAGNOSIS — I2510 Atherosclerotic heart disease of native coronary artery without angina pectoris: Secondary | ICD-10-CM | POA: Diagnosis not present

## 2020-05-10 DIAGNOSIS — I4891 Unspecified atrial fibrillation: Secondary | ICD-10-CM | POA: Diagnosis not present

## 2020-05-10 DIAGNOSIS — M810 Age-related osteoporosis without current pathological fracture: Secondary | ICD-10-CM | POA: Diagnosis not present

## 2020-05-10 DIAGNOSIS — I10 Essential (primary) hypertension: Secondary | ICD-10-CM | POA: Diagnosis not present

## 2020-05-10 DIAGNOSIS — Z792 Long term (current) use of antibiotics: Secondary | ICD-10-CM | POA: Diagnosis not present

## 2020-05-10 DIAGNOSIS — I739 Peripheral vascular disease, unspecified: Secondary | ICD-10-CM | POA: Diagnosis not present

## 2020-05-10 DIAGNOSIS — Z7901 Long term (current) use of anticoagulants: Secondary | ICD-10-CM | POA: Diagnosis not present

## 2020-05-10 DIAGNOSIS — I5032 Chronic diastolic (congestive) heart failure: Secondary | ICD-10-CM | POA: Diagnosis not present

## 2020-05-10 DIAGNOSIS — L97513 Non-pressure chronic ulcer of other part of right foot with necrosis of muscle: Secondary | ICD-10-CM | POA: Diagnosis not present

## 2020-05-10 DIAGNOSIS — I130 Hypertensive heart and chronic kidney disease with heart failure and stage 1 through stage 4 chronic kidney disease, or unspecified chronic kidney disease: Secondary | ICD-10-CM | POA: Diagnosis not present

## 2020-05-10 DIAGNOSIS — I272 Pulmonary hypertension, unspecified: Secondary | ICD-10-CM | POA: Diagnosis not present

## 2020-05-10 DIAGNOSIS — N1831 Chronic kidney disease, stage 3a: Secondary | ICD-10-CM | POA: Diagnosis not present

## 2020-05-10 DIAGNOSIS — G9009 Other idiopathic peripheral autonomic neuropathy: Secondary | ICD-10-CM | POA: Diagnosis not present

## 2020-05-10 DIAGNOSIS — E538 Deficiency of other specified B group vitamins: Secondary | ICD-10-CM | POA: Diagnosis not present

## 2020-05-10 DIAGNOSIS — E7849 Other hyperlipidemia: Secondary | ICD-10-CM | POA: Diagnosis not present

## 2020-05-10 DIAGNOSIS — S62102D Fracture of unspecified carpal bone, left wrist, subsequent encounter for fracture with routine healing: Secondary | ICD-10-CM | POA: Diagnosis not present

## 2020-05-10 DIAGNOSIS — M1990 Unspecified osteoarthritis, unspecified site: Secondary | ICD-10-CM | POA: Diagnosis not present

## 2020-05-10 DIAGNOSIS — K219 Gastro-esophageal reflux disease without esophagitis: Secondary | ICD-10-CM | POA: Diagnosis not present

## 2020-05-10 DIAGNOSIS — Z7982 Long term (current) use of aspirin: Secondary | ICD-10-CM | POA: Diagnosis not present

## 2020-05-10 DIAGNOSIS — Z9181 History of falling: Secondary | ICD-10-CM | POA: Diagnosis not present

## 2020-05-10 DIAGNOSIS — E559 Vitamin D deficiency, unspecified: Secondary | ICD-10-CM | POA: Diagnosis not present

## 2020-05-10 DIAGNOSIS — G2581 Restless legs syndrome: Secondary | ICD-10-CM | POA: Diagnosis not present

## 2020-05-10 DIAGNOSIS — M5136 Other intervertebral disc degeneration, lumbar region: Secondary | ICD-10-CM | POA: Diagnosis not present

## 2020-05-11 DIAGNOSIS — K219 Gastro-esophageal reflux disease without esophagitis: Secondary | ICD-10-CM | POA: Diagnosis not present

## 2020-05-11 DIAGNOSIS — Z9181 History of falling: Secondary | ICD-10-CM | POA: Diagnosis not present

## 2020-05-11 DIAGNOSIS — G2581 Restless legs syndrome: Secondary | ICD-10-CM | POA: Diagnosis not present

## 2020-05-11 DIAGNOSIS — I272 Pulmonary hypertension, unspecified: Secondary | ICD-10-CM | POA: Diagnosis not present

## 2020-05-11 DIAGNOSIS — E559 Vitamin D deficiency, unspecified: Secondary | ICD-10-CM | POA: Diagnosis not present

## 2020-05-11 DIAGNOSIS — N1831 Chronic kidney disease, stage 3a: Secondary | ICD-10-CM | POA: Diagnosis not present

## 2020-05-11 DIAGNOSIS — I5032 Chronic diastolic (congestive) heart failure: Secondary | ICD-10-CM | POA: Diagnosis not present

## 2020-05-11 DIAGNOSIS — I10 Essential (primary) hypertension: Secondary | ICD-10-CM | POA: Diagnosis not present

## 2020-05-11 DIAGNOSIS — I739 Peripheral vascular disease, unspecified: Secondary | ICD-10-CM | POA: Diagnosis not present

## 2020-05-11 DIAGNOSIS — L97513 Non-pressure chronic ulcer of other part of right foot with necrosis of muscle: Secondary | ICD-10-CM | POA: Diagnosis not present

## 2020-05-11 DIAGNOSIS — G9009 Other idiopathic peripheral autonomic neuropathy: Secondary | ICD-10-CM | POA: Diagnosis not present

## 2020-05-11 DIAGNOSIS — I130 Hypertensive heart and chronic kidney disease with heart failure and stage 1 through stage 4 chronic kidney disease, or unspecified chronic kidney disease: Secondary | ICD-10-CM | POA: Diagnosis not present

## 2020-05-11 DIAGNOSIS — I4891 Unspecified atrial fibrillation: Secondary | ICD-10-CM | POA: Diagnosis not present

## 2020-05-11 DIAGNOSIS — E538 Deficiency of other specified B group vitamins: Secondary | ICD-10-CM | POA: Diagnosis not present

## 2020-05-11 DIAGNOSIS — Z792 Long term (current) use of antibiotics: Secondary | ICD-10-CM | POA: Diagnosis not present

## 2020-05-11 DIAGNOSIS — M1990 Unspecified osteoarthritis, unspecified site: Secondary | ICD-10-CM | POA: Diagnosis not present

## 2020-05-11 DIAGNOSIS — S62102D Fracture of unspecified carpal bone, left wrist, subsequent encounter for fracture with routine healing: Secondary | ICD-10-CM | POA: Diagnosis not present

## 2020-05-11 DIAGNOSIS — E7849 Other hyperlipidemia: Secondary | ICD-10-CM | POA: Diagnosis not present

## 2020-05-11 DIAGNOSIS — M5136 Other intervertebral disc degeneration, lumbar region: Secondary | ICD-10-CM | POA: Diagnosis not present

## 2020-05-11 DIAGNOSIS — I872 Venous insufficiency (chronic) (peripheral): Secondary | ICD-10-CM | POA: Diagnosis not present

## 2020-05-11 DIAGNOSIS — M810 Age-related osteoporosis without current pathological fracture: Secondary | ICD-10-CM | POA: Diagnosis not present

## 2020-05-11 DIAGNOSIS — I2510 Atherosclerotic heart disease of native coronary artery without angina pectoris: Secondary | ICD-10-CM | POA: Diagnosis not present

## 2020-05-11 DIAGNOSIS — Z7901 Long term (current) use of anticoagulants: Secondary | ICD-10-CM | POA: Diagnosis not present

## 2020-05-11 DIAGNOSIS — Z7982 Long term (current) use of aspirin: Secondary | ICD-10-CM | POA: Diagnosis not present

## 2020-05-15 DIAGNOSIS — M1990 Unspecified osteoarthritis, unspecified site: Secondary | ICD-10-CM | POA: Diagnosis not present

## 2020-05-15 DIAGNOSIS — Z792 Long term (current) use of antibiotics: Secondary | ICD-10-CM | POA: Diagnosis not present

## 2020-05-15 DIAGNOSIS — I5032 Chronic diastolic (congestive) heart failure: Secondary | ICD-10-CM | POA: Diagnosis not present

## 2020-05-15 DIAGNOSIS — E7849 Other hyperlipidemia: Secondary | ICD-10-CM | POA: Diagnosis not present

## 2020-05-15 DIAGNOSIS — I130 Hypertensive heart and chronic kidney disease with heart failure and stage 1 through stage 4 chronic kidney disease, or unspecified chronic kidney disease: Secondary | ICD-10-CM | POA: Diagnosis not present

## 2020-05-15 DIAGNOSIS — E559 Vitamin D deficiency, unspecified: Secondary | ICD-10-CM | POA: Diagnosis not present

## 2020-05-15 DIAGNOSIS — E538 Deficiency of other specified B group vitamins: Secondary | ICD-10-CM | POA: Diagnosis not present

## 2020-05-15 DIAGNOSIS — I272 Pulmonary hypertension, unspecified: Secondary | ICD-10-CM | POA: Diagnosis not present

## 2020-05-15 DIAGNOSIS — K219 Gastro-esophageal reflux disease without esophagitis: Secondary | ICD-10-CM | POA: Diagnosis not present

## 2020-05-15 DIAGNOSIS — L97513 Non-pressure chronic ulcer of other part of right foot with necrosis of muscle: Secondary | ICD-10-CM | POA: Diagnosis not present

## 2020-05-15 DIAGNOSIS — G2581 Restless legs syndrome: Secondary | ICD-10-CM | POA: Diagnosis not present

## 2020-05-15 DIAGNOSIS — M810 Age-related osteoporosis without current pathological fracture: Secondary | ICD-10-CM | POA: Diagnosis not present

## 2020-05-15 DIAGNOSIS — N1831 Chronic kidney disease, stage 3a: Secondary | ICD-10-CM | POA: Diagnosis not present

## 2020-05-15 DIAGNOSIS — M5136 Other intervertebral disc degeneration, lumbar region: Secondary | ICD-10-CM | POA: Diagnosis not present

## 2020-05-15 DIAGNOSIS — Z7901 Long term (current) use of anticoagulants: Secondary | ICD-10-CM | POA: Diagnosis not present

## 2020-05-15 DIAGNOSIS — I2510 Atherosclerotic heart disease of native coronary artery without angina pectoris: Secondary | ICD-10-CM | POA: Diagnosis not present

## 2020-05-15 DIAGNOSIS — G9009 Other idiopathic peripheral autonomic neuropathy: Secondary | ICD-10-CM | POA: Diagnosis not present

## 2020-05-15 DIAGNOSIS — Z7982 Long term (current) use of aspirin: Secondary | ICD-10-CM | POA: Diagnosis not present

## 2020-05-15 DIAGNOSIS — I4891 Unspecified atrial fibrillation: Secondary | ICD-10-CM | POA: Diagnosis not present

## 2020-05-15 DIAGNOSIS — I872 Venous insufficiency (chronic) (peripheral): Secondary | ICD-10-CM | POA: Diagnosis not present

## 2020-05-15 DIAGNOSIS — S62102D Fracture of unspecified carpal bone, left wrist, subsequent encounter for fracture with routine healing: Secondary | ICD-10-CM | POA: Diagnosis not present

## 2020-05-15 DIAGNOSIS — Z9181 History of falling: Secondary | ICD-10-CM | POA: Diagnosis not present

## 2020-05-16 DIAGNOSIS — M5136 Other intervertebral disc degeneration, lumbar region: Secondary | ICD-10-CM | POA: Diagnosis not present

## 2020-05-16 DIAGNOSIS — M810 Age-related osteoporosis without current pathological fracture: Secondary | ICD-10-CM | POA: Diagnosis not present

## 2020-05-16 DIAGNOSIS — N1831 Chronic kidney disease, stage 3a: Secondary | ICD-10-CM | POA: Diagnosis not present

## 2020-05-16 DIAGNOSIS — I4891 Unspecified atrial fibrillation: Secondary | ICD-10-CM | POA: Diagnosis not present

## 2020-05-16 DIAGNOSIS — Z7982 Long term (current) use of aspirin: Secondary | ICD-10-CM | POA: Diagnosis not present

## 2020-05-16 DIAGNOSIS — I272 Pulmonary hypertension, unspecified: Secondary | ICD-10-CM | POA: Diagnosis not present

## 2020-05-16 DIAGNOSIS — I872 Venous insufficiency (chronic) (peripheral): Secondary | ICD-10-CM | POA: Diagnosis not present

## 2020-05-16 DIAGNOSIS — E7849 Other hyperlipidemia: Secondary | ICD-10-CM | POA: Diagnosis not present

## 2020-05-16 DIAGNOSIS — Z7901 Long term (current) use of anticoagulants: Secondary | ICD-10-CM | POA: Diagnosis not present

## 2020-05-16 DIAGNOSIS — E559 Vitamin D deficiency, unspecified: Secondary | ICD-10-CM | POA: Diagnosis not present

## 2020-05-16 DIAGNOSIS — I130 Hypertensive heart and chronic kidney disease with heart failure and stage 1 through stage 4 chronic kidney disease, or unspecified chronic kidney disease: Secondary | ICD-10-CM | POA: Diagnosis not present

## 2020-05-16 DIAGNOSIS — G2581 Restless legs syndrome: Secondary | ICD-10-CM | POA: Diagnosis not present

## 2020-05-16 DIAGNOSIS — Z792 Long term (current) use of antibiotics: Secondary | ICD-10-CM | POA: Diagnosis not present

## 2020-05-16 DIAGNOSIS — L97511 Non-pressure chronic ulcer of other part of right foot limited to breakdown of skin: Secondary | ICD-10-CM | POA: Diagnosis not present

## 2020-05-16 DIAGNOSIS — L97513 Non-pressure chronic ulcer of other part of right foot with necrosis of muscle: Secondary | ICD-10-CM | POA: Diagnosis not present

## 2020-05-16 DIAGNOSIS — I5032 Chronic diastolic (congestive) heart failure: Secondary | ICD-10-CM | POA: Diagnosis not present

## 2020-05-16 DIAGNOSIS — Z9181 History of falling: Secondary | ICD-10-CM | POA: Diagnosis not present

## 2020-05-16 DIAGNOSIS — G9009 Other idiopathic peripheral autonomic neuropathy: Secondary | ICD-10-CM | POA: Diagnosis not present

## 2020-05-16 DIAGNOSIS — S62102D Fracture of unspecified carpal bone, left wrist, subsequent encounter for fracture with routine healing: Secondary | ICD-10-CM | POA: Diagnosis not present

## 2020-05-16 DIAGNOSIS — E538 Deficiency of other specified B group vitamins: Secondary | ICD-10-CM | POA: Diagnosis not present

## 2020-05-16 DIAGNOSIS — I2510 Atherosclerotic heart disease of native coronary artery without angina pectoris: Secondary | ICD-10-CM | POA: Diagnosis not present

## 2020-05-16 DIAGNOSIS — K219 Gastro-esophageal reflux disease without esophagitis: Secondary | ICD-10-CM | POA: Diagnosis not present

## 2020-05-16 DIAGNOSIS — M1990 Unspecified osteoarthritis, unspecified site: Secondary | ICD-10-CM | POA: Diagnosis not present

## 2020-05-17 DIAGNOSIS — S41112A Laceration without foreign body of left upper arm, initial encounter: Secondary | ICD-10-CM | POA: Diagnosis not present

## 2020-05-18 ENCOUNTER — Encounter: Payer: Medicare Other | Attending: Physician Assistant | Admitting: Physician Assistant

## 2020-05-18 ENCOUNTER — Other Ambulatory Visit: Payer: Self-pay

## 2020-05-18 DIAGNOSIS — M810 Age-related osteoporosis without current pathological fracture: Secondary | ICD-10-CM | POA: Diagnosis not present

## 2020-05-18 DIAGNOSIS — G2581 Restless legs syndrome: Secondary | ICD-10-CM | POA: Diagnosis not present

## 2020-05-18 DIAGNOSIS — B353 Tinea pedis: Secondary | ICD-10-CM | POA: Insufficient documentation

## 2020-05-18 DIAGNOSIS — Z792 Long term (current) use of antibiotics: Secondary | ICD-10-CM | POA: Diagnosis not present

## 2020-05-18 DIAGNOSIS — I7389 Other specified peripheral vascular diseases: Secondary | ICD-10-CM | POA: Diagnosis not present

## 2020-05-18 DIAGNOSIS — S41102A Unspecified open wound of left upper arm, initial encounter: Secondary | ICD-10-CM | POA: Insufficient documentation

## 2020-05-18 DIAGNOSIS — L97513 Non-pressure chronic ulcer of other part of right foot with necrosis of muscle: Secondary | ICD-10-CM | POA: Diagnosis not present

## 2020-05-18 DIAGNOSIS — Z9181 History of falling: Secondary | ICD-10-CM | POA: Diagnosis not present

## 2020-05-18 DIAGNOSIS — I130 Hypertensive heart and chronic kidney disease with heart failure and stage 1 through stage 4 chronic kidney disease, or unspecified chronic kidney disease: Secondary | ICD-10-CM | POA: Diagnosis not present

## 2020-05-18 DIAGNOSIS — E7849 Other hyperlipidemia: Secondary | ICD-10-CM | POA: Diagnosis not present

## 2020-05-18 DIAGNOSIS — L97512 Non-pressure chronic ulcer of other part of right foot with fat layer exposed: Secondary | ICD-10-CM | POA: Diagnosis not present

## 2020-05-18 DIAGNOSIS — K219 Gastro-esophageal reflux disease without esophagitis: Secondary | ICD-10-CM | POA: Diagnosis not present

## 2020-05-18 DIAGNOSIS — E559 Vitamin D deficiency, unspecified: Secondary | ICD-10-CM | POA: Diagnosis not present

## 2020-05-18 DIAGNOSIS — I480 Paroxysmal atrial fibrillation: Secondary | ICD-10-CM | POA: Insufficient documentation

## 2020-05-18 DIAGNOSIS — G9009 Other idiopathic peripheral autonomic neuropathy: Secondary | ICD-10-CM | POA: Diagnosis not present

## 2020-05-18 DIAGNOSIS — I872 Venous insufficiency (chronic) (peripheral): Secondary | ICD-10-CM | POA: Diagnosis not present

## 2020-05-18 DIAGNOSIS — I2510 Atherosclerotic heart disease of native coronary artery without angina pectoris: Secondary | ICD-10-CM | POA: Diagnosis not present

## 2020-05-18 DIAGNOSIS — Z7901 Long term (current) use of anticoagulants: Secondary | ICD-10-CM | POA: Diagnosis not present

## 2020-05-18 DIAGNOSIS — S62102D Fracture of unspecified carpal bone, left wrist, subsequent encounter for fracture with routine healing: Secondary | ICD-10-CM | POA: Diagnosis not present

## 2020-05-18 DIAGNOSIS — I5032 Chronic diastolic (congestive) heart failure: Secondary | ICD-10-CM | POA: Diagnosis not present

## 2020-05-18 DIAGNOSIS — I272 Pulmonary hypertension, unspecified: Secondary | ICD-10-CM | POA: Diagnosis not present

## 2020-05-18 DIAGNOSIS — E538 Deficiency of other specified B group vitamins: Secondary | ICD-10-CM | POA: Diagnosis not present

## 2020-05-18 DIAGNOSIS — M5136 Other intervertebral disc degeneration, lumbar region: Secondary | ICD-10-CM | POA: Diagnosis not present

## 2020-05-18 DIAGNOSIS — Z7982 Long term (current) use of aspirin: Secondary | ICD-10-CM | POA: Diagnosis not present

## 2020-05-18 DIAGNOSIS — I4891 Unspecified atrial fibrillation: Secondary | ICD-10-CM | POA: Diagnosis not present

## 2020-05-18 DIAGNOSIS — W19XXXA Unspecified fall, initial encounter: Secondary | ICD-10-CM | POA: Diagnosis not present

## 2020-05-18 DIAGNOSIS — M1990 Unspecified osteoarthritis, unspecified site: Secondary | ICD-10-CM | POA: Diagnosis not present

## 2020-05-18 DIAGNOSIS — N1831 Chronic kidney disease, stage 3a: Secondary | ICD-10-CM | POA: Diagnosis not present

## 2020-05-18 NOTE — Progress Notes (Addendum)
[name], [name] ([id number]) Visit Report for 05/18/2020 Chief Complaint Document Details Patient Name: [name], [name]. Date of Service: 05/18/2020 10:00 AM Medical Record Number: [id number] Patient Account Number: [id number] Date of Birth/Sex: Aug 08, 1929 (87 y.o. F) Treating RN: Dolan Amen Primary Care Provider: Ria Bush Other Clinician: Referring Provider: Ria Bush Treating Provider/Extender: Skipper Cliche in Treatment: 31 Information Obtained from: Patient Chief Complaint Right Lateral Foot Ulcer Electronic Signature(s) Signed: 05/18/2020 10:34:23 AM By: Worthy Keeler PA-C Entered By: Worthy Keeler on 05/18/2020 10:34:23 [name], [name] ([id number]) -------------------------------------------------------------------------------- HPI Details Patient Name: [name] Date of Service: 05/18/2020 10:00 AM Medical Record Number: [id number] Patient Account Number: [id number] Date of Birth/Sex: Oct 02, 1929 (87 y.o. F) Treating RN: Dolan Amen Primary Care Provider: Ria Bush Other Clinician: Referring Provider: Ria Bush Treating Provider/Extender: Skipper Cliche in Treatment: 31 History of Present Illness HPI Description: 10/12/2019 upon evaluation today patient appears to be doing somewhat poorly in regard to her wound at this point. She has a ulcer over the right fifth metatarsal region. This has been managed by podiatry up to this point. Unfortunately the patient just does not seem to be making the progress that they were hoping for an subsequently the patient was referred to [place] by [facility] Dr. Posey Pronto. With that being said I did note that the patient does have peripheral vascular disease which has been managed by Dr. Lazaro Arms her most recent angiogram with intervention was on 08/02/2019. The ABIs following were on the right 1.30 with a TBI of 0.61 normal left 1.31 with a TBI of 0.58. Obviously things seem to be doing  somewhat better at this point hopefully enough to allow her to heal. She is on anticoagulant therapy due to atrial fibrillation long-term. With that being said the patient is unfortunately having quite a bit of discomfort at this time secondary to the wound. She also has erythema which is very likely secondary to infection. I am going obtain a culture of the area today once debridement is complete. Apparently the patient also had a x-ray that was performed by Dr. Posey Pronto on 09/21/2019 and this showed that the patient had no obvious signs on x-ray of cortical irregularity and no osteomyelitis obviously noted. With that being said as I discussed with the patient's daughter as well this does not indicate that the patient absolutely has no osteomyelitis as x-rays are only at best 50% helpful in identifying this complication. Nonetheless I do believe that we need to have the patient continue to have this further evaluated specifically I am to recommend that we check into an MRI which I think would be ideal to evaluate whether or not there is osteomyelitis present. 7/16; this is a patient with an ulcer over the right fifth metatarsal head. She [facility] with Dr. [facility] She is due to have an MRI of the foot on 7/26. Using Santyl to the wound 11/04/2019 on evaluation today patient appears to be doing okay in regard to her wound at this time. Unfortunately since I last saw her which was actually her second visit here in the clinic she has moved into a new apartment and then subsequently had a fall. She ended up in the hospital she did fracture her left arm and subsequently has also been dealing with a lot of dizziness she has been at [employer]. She tells me that getting in the change the dressing has been somewhat difficult as far as daily dressing  changes are concerned with the Santyl. Nonetheless when they do change it that is what is been used and her daughter-in-law has also been  coming in to help out as well with the dressing changes to try to make sure it gets done daily. Fortunately there is no signs of active infection at this time. No fever chills noted. I do believe the Santyl has been beneficial and is likely still the best way to go at this point for the patient. I did review the patient's MRI which showed some potential for reactive osteitis but did not show any signs of osteomyelitis overtly. That cannot be excluded but again right now it does not appear that is very likely present as well. This is good news. I also did review her culture she has been on the Bactrim she tells me she has taken this and again that was appropriate based on the results of her culture. 11/19/2019 upon evaluation today patient appears to be doing decently well in regard to her wound. Fortunately there is no signs of active infection at this time wound is not significantly larger in fact is roughly about the same at this point. With that being said there is not as much erythema as previously noted I think the infection seems to be under much better control which is great news. Unfortunately she has fallen since I last saw her and broken her left arm she has an appointment with them following the appointment with me. 8/25; patient was worked in acutely today at the request of home health and was concerned about her right heel, generalized erythema in her feet bilaterally. According to her daughter things have been getting worse over the last for 5 days but not precisely from a wound care point of view. Her wound is on the lateral part of the right fifth metatarsal head looks about the same as I remember this. She has developed dryness and flaking of the skin on her plantar feet. As well a very tender area localized over the insertion point of the Achilles tendon. She has restless leg syndrome. Does not specifically offload the area at night. She wears socks at home and does not really give me  a pressure on the right heel issue. I reviewed her vascular status. She underwent her last revascularization on 08/02/2019 by Dr. Lucky Cowboy. This included an angioplasty of the right peroneal artery as well as the right posterior tibial artery. Her last noninvasive arterial study was on 11/23/2019. On the right her ABI was 1.08 TBI of 0.48. On the left ABI at 1.21 with a TBI of 0.54. Family relates that Dr. Lucky Cowboy felt that he had done the best he could and this. We have been using Santyl to the one wound she has on the right lateral fifth metatarsal head. She does not have a new wound today which is the concern that letter coming into the clinic 12/09/2019 on evaluation today patient appears to be doing decently well in regard to her foot ulcer at this point. She does note that with the compression wrap that the home health nurse put on after contacting our clinic that she actually did much better and felt much better. Her heel is also improved. Fortunately there is no signs of active infection at this time. No fevers, chills, nausea, vomiting, or diarrhea. 12/24/2019 upon evaluation today patient appears to be doing somewhat poorly still in regard to her foot. Unfortunately this is just not showing the signs of improvement that I  would like to see it is a little bit cleaner but also appears to be erythematous and I believe there is infection noted at this point. We will get a need to address this in my opinion. I did actually recommend that we switch back to the Iodoflex as well apparently the home health nurse had switch this away that not really what we want to do at this point in my opinion. They were using silver alginate I think most recently. 01/06/2020 upon evaluation today patient actually appears to be doing much better in regard to her wound. We have switched back to the silver alginate after her home health nurse contacted me and honestly I feel like she is doing quite well in that regard. She does have  a lot of drainage and therefore I think that is probably a better option for. With that being said she is tolerating the compression wrap without complication that also seems to be helping. 01/28/2020 on evaluation today patient appears to be doing well with regard to her foot ulcer. I do feel like she is showing signs of improvement which is great news. There is no signs of active infection at this time which is also good news. No fevers, chills, nausea, vomiting, or diarrhea. Unfortunately she did have a fall and sustained an injury to the left upper arm. She has a skin tear here this appears to be doing okay although [name], [name]. ([id number]) the dressing was stuck to this this was an alginate dressing. I really think she probably needs something like Xeroform to try to prevent anything from sticking to this region. Other than that she seems to be doing quite well. 02/11/2020 on evaluation today patient's arm skin tear appears to be completely healed which is great. Unfortunately she is still having issues at this time with her foot although this seems to be showing signs of improvement it still very slow. There is no signs of active infection at this time. 03/06/2020 upon evaluation today patient appears to be doing decently well in regard to her foot ulcer. There is some minimal slough noted on the surface of the wound building up at this point. Otherwise I really feel like she is doing quite nicely with quite a bit of new granulation and epithelial growth. 03/23/2020 upon evaluation today patient appears to be doing well with regards to her wound. She has been tolerating the dressing changes without complication. Fortunately there is no evidence of active infection and overall feel like her foot ulcer is doing excellent at this point. 04/13/2020 on evaluation today patient appears to be doing well with regard to her foot ulcer. She has been tolerating the dressing changes  without complication. Fortunately there is no signs of active infection at this time. Overall I feel like the wound is getting close to complete closure and this appears to be doing quite well in my opinion. 04/27/2020 on evaluation today patient appears to be doing excellent in regard to her wound. She has been tolerating the dressing changes without complication. Fortunately there is no signs of active infection and overall very pleased with where things stand today. In fact I feel like the wound is very close to complete resolution. 05/18/2020 upon evaluation today patient appears to be doing well with regard to her foot ulcer. There is no evidence of active infection at this time which is great news and overall very pleased with where things stand. No fevers, chills, nausea, vomiting, or diarrhea. I do feel  like she is making great progress and I feel like that the wound is very tiny but again this last portion getting close has been somewhat difficult. Electronic Signature(s) Signed: 05/18/2020 11:06:47 AM By: Worthy Keeler PA-C Entered By: Worthy Keeler on 05/18/2020 11:06:46 [name] ([id number]) -------------------------------------------------------------------------------- Physical Exam Details Patient Name: [name], [name]. Date of Service: 05/18/2020 10:00 AM Medical Record Number: [id number] Patient Account Number: [id number] Date of Birth/Sex: March 06, 1930 (87 y.o. F) Treating RN: Dolan Amen Primary Care Provider: Ria Bush Other Clinician: Referring Provider: Ria Bush Treating Provider/Extender: Skipper Cliche in Treatment: 94 Constitutional Well-nourished and well-hydrated in no acute distress. Respiratory normal breathing without difficulty. Psychiatric this patient is able to make decisions and demonstrates good insight into disease process. Alert and Oriented x 3. pleasant and cooperative. Notes Upon inspection patient showed no signs of  active infection at this time. There is no fevers, chills, nausea, vomiting, or diarrhea noted at this point. With that being said I do feel like she is unfortunately just not seeing the improvement that we really want to see at this point. I does get it completely closed. That is a big concern here. Electronic Signature(s) Signed: 05/18/2020 11:09:18 AM By: Worthy Keeler PA-C Entered By: Worthy Keeler on 05/18/2020 11:09:15 [name] ([id number]) -------------------------------------------------------------------------------- Physician Orders Details Patient Name: [name] Date of Service: 05/18/2020 10:00 AM Medical Record Number: [id number] Patient Account Number: [id number] Date of Birth/Sex: 1929-12-24 (87 y.o. F) Treating RN: Dolan Amen Primary Care Provider: Ria Bush Other Clinician: Referring Provider: Ria Bush Treating Provider/Extender: Skipper Cliche in Treatment: 101 Verbal / Phone Orders: No Diagnosis Coding ICD-10 Coding Code Description I73.89 Other specified peripheral vascular diseases L97.512 Non-pressure chronic ulcer of other part of right foot with fat layer exposed S41.102A Unspecified open wound of left upper arm, initial encounter Z79.01 Long term (current) use of anticoagulants I48.0 Paroxysmal atrial fibrillation B35.3 Tinea pedis Follow-up Appointments o Return Appointment in 3 weeks. Home Health Wound #3 Right Metatarsal head fifth o [place]: [place] for wound care. May utilize formulary equivalent dressing for wound treatment orders unless otherwise specified. Home Health Nurse may visit PRN to address patientos wound care needs. o Scheduled days for dressing changes to be completed; exception, patient has scheduled wound care visit that day. o **Please direct any NON-WOUND related issues/requests for orders to patient's Primary Care Physician. **If  current dressing causes regression in wound condition, may D/C ordered dressing product/s and apply Normal Saline Moist Dressing daily until next [place] or Other MD appointment. **Notify [facility] of regression in wound condition at [phone]. Wound Treatment Wound #3 - Metatarsal head fifth Wound Laterality: Right Cleanser: Normal Saline (Generic) 2 x Per Week/30 Days Discharge Instructions: Wash your hands with soap and water. Remove old dressing, discard into plastic bag and place into trash. Cleanse the wound with Normal Saline prior to applying a clean dressing using gauze sponges, not tissues or cotton balls. Do not scrub or use excessive force. Pat dry using gauze sponges, not tissue or cotton balls. Primary Dressing: Prisma 4.34 (in) 2 x Per Week/30 Days Discharge Instructions: Add to wound bed dry. Secondary Dressing: Mepilex Border Flex, 4x4 (in/in) (Generic) 2 x Per Week/30 Days Discharge Instructions: Apply to wound as directed. Do not cut. Electronic Signature(s) Signed: 05/18/2020 12:49:19 PM By: Georges Mouse, Minus Breeding RN Signed: 05/18/2020 4:44:32 PM By: Worthy Keeler PA-C Entered By: Laurin Coder  Staci Acosta on 05/18/2020 10:40:49 [name], [name] ([id number]) -------------------------------------------------------------------------------- Problem List Details Patient Name: RONA, TOMSON. Date of Service: 05/18/2020 10:00 AM Medical Record Number: [id number] Patient Account Number: [id number] Date of Birth/Sex: 1929/10/01 (87 y.o. F) Treating RN: Dolan Amen Primary Care Provider: Ria Bush Other Clinician: Referring Provider: Ria Bush Treating Provider/Extender: Skipper Cliche in Treatment: 31 Active Problems ICD-10 Encounter Code Description Active Date MDM Diagnosis I73.89 Other specified peripheral vascular diseases 10/12/2019 No Yes L97.512 Non-pressure chronic ulcer of other part of right foot with fat layer  10/12/2019 No Yes exposed S41.102A Unspecified open wound of left upper arm, initial encounter 01/28/2020 No Yes Z79.01 Long term (current) use of anticoagulants 10/12/2019 No Yes I48.0 Paroxysmal atrial fibrillation 10/12/2019 No Yes B35.3 Tinea pedis 12/01/2019 No Yes Inactive Problems Resolved Problems Electronic Signature(s) Signed: 05/18/2020 10:31:45 AM By: Worthy Keeler PA-C Entered By: Worthy Keeler on 05/18/2020 10:31:43 [name] ([id number]) -------------------------------------------------------------------------------- Progress Note Details Patient Name: [name]. Date of Service: 05/18/2020 10:00 AM Medical Record Number: [id number] Patient Account Number: [id number] Date of Birth/Sex: 21-Apr-1929 (87 y.o. F) Treating RN: Dolan Amen Primary Care Provider: Ria Bush Other Clinician: Referring Provider: Ria Bush Treating Provider/Extender: Skipper Cliche in Treatment: 31 Subjective Chief Complaint Information obtained from Patient Right Lateral Foot Ulcer History of Present Illness (HPI) 10/12/2019 upon evaluation today patient appears to be doing somewhat poorly in regard to her wound at this point. She has a ulcer over the right fifth metatarsal region. This has been managed by podiatry up to this point. Unfortunately the patient just does not seem to be making the progress that they were hoping for an subsequently the patient was referred to [place] by [facility] Dr. Posey Pronto. With that being said I did note that the patient does have peripheral vascular disease which has been managed by Dr. Lazaro Arms her most recent angiogram with intervention was on 08/02/2019. The ABIs following were on the right 1.30 with a TBI of 0.61 normal left 1.31 with a TBI of 0.58. Obviously things seem to be doing somewhat better at this point hopefully enough to allow her to heal. She is on anticoagulant therapy due to atrial fibrillation long-term. With that  being said the patient is unfortunately having quite a bit of discomfort at this time secondary to the wound. She also has erythema which is very likely secondary to infection. I am going obtain a culture of the area today once debridement is complete. Apparently the patient also had a x-ray that was performed by Dr. Posey Pronto on 09/21/2019 and this showed that the patient had no obvious signs on x-ray of cortical irregularity and no osteomyelitis obviously noted. With that being said as I discussed with the patient's daughter as well this does not indicate that the patient absolutely has no osteomyelitis as x-rays are only at best 50% helpful in identifying this complication. Nonetheless I do believe that we need to have the patient continue to have this further evaluated specifically I am to recommend that we check into an MRI which I think would be ideal to evaluate whether or not there is osteomyelitis present. 7/16; this is a patient with an ulcer over the right fifth metatarsal head. She follows with Dr[facility]boy [facility]. She is due to have an MRI of the foot on 7/26. Using Santyl to the wound 11/04/2019 on evaluation today patient appears to be doing okay in regard to her wound at  this time. Unfortunately since I last saw her which was actually her second visit here in the clinic she has moved into a new apartment and then subsequently had a fall. She ended up in the hospital she did fracture her left arm and subsequently has also been dealing with a lot of dizziness she has been at [employer]. She tells me that getting in the change the dressing has been somewhat difficult as far as daily dressing changes are concerned with the Santyl. Nonetheless when they do change it that is what is been used and her daughter-in-law has also been coming in to help out as well with the dressing changes to try to make sure it gets done daily. Fortunately there is no signs of active infection at this  time. No fever chills noted. I do believe the Santyl has been beneficial and is likely still the best way to go at this point for the patient. I did review the patient's MRI which showed some potential for reactive osteitis but did not show any signs of osteomyelitis overtly. That cannot be excluded but again right now it does not appear that is very likely present as well. This is good news. I also did review her culture she has been on the Bactrim she tells me she has taken this and again that was appropriate based on the results of her culture. 11/19/2019 upon evaluation today patient appears to be doing decently well in regard to her wound. Fortunately there is no signs of active infection at this time wound is not significantly larger in fact is roughly about the same at this point. With that being said there is not as much erythema as previously noted I think the infection seems to be under much better control which is great news. Unfortunately she has fallen since I last saw her and broken her left arm she has an appointment with them following the appointment with me. 8/25; patient was worked in acutely today at the request of home health and was concerned about her right heel, generalized erythema in her feet bilaterally. According to her daughter things have been getting worse over the last for 5 days but not precisely from a wound care point of view. Her wound is on the lateral part of the right fifth metatarsal head looks about the same as I remember this. She has developed dryness and flaking of the skin on her plantar feet. As well a very tender area localized over the insertion point of the Achilles tendon. She has restless leg syndrome. Does not specifically offload the area at night. She wears socks at home and does not really give me a pressure on the right heel issue. I reviewed her vascular status. She underwent her last revascularization on 08/02/2019 by Dr. Lucky Cowboy. This included an  angioplasty of the right peroneal artery as well as the right posterior tibial artery. Her last noninvasive arterial study was on 11/23/2019. On the right her ABI was 1.08 TBI of 0.48. On the left ABI at 1.21 with a TBI of 0.54. Family relates that Dr. Lucky Cowboy felt that he had done the best he could and this. We have been using Santyl to the one wound she has on the right lateral fifth metatarsal head. She does not have a new wound today which is the concern that letter coming into the clinic 12/09/2019 on evaluation today patient appears to be doing decently well in regard to her foot ulcer at this point. She does  note that with the compression wrap that the home health nurse put on after contacting our clinic that she actually did much better and felt much better. Her heel is also improved. Fortunately there is no signs of active infection at this time. No fevers, chills, nausea, vomiting, or diarrhea. 12/24/2019 upon evaluation today patient appears to be doing somewhat poorly still in regard to her foot. Unfortunately this is just not showing the signs of improvement that I would like to see it is a little bit cleaner but also appears to be erythematous and I believe there is infection noted at this point. We will get a need to address this in my opinion. I did actually recommend that we switch back to the Iodoflex as well apparently the home health nurse had switch this away that not really what we want to do at this point in my opinion. They were using silver alginate I think most recently. 01/06/2020 upon evaluation today patient actually appears to be doing much better in regard to her wound. We have switched back to the silver alginate after her home health nurse contacted me and honestly I feel like she is doing quite well in that regard. She does have a lot of drainage and therefore I think that is probably a better option for. With that being said she is tolerating the compression wrap without  complication that [name], [name]. ([id number]) also seems to be helping. 01/28/2020 on evaluation today patient appears to be doing well with regard to her foot ulcer. I do feel like she is showing signs of improvement which is great news. There is no signs of active infection at this time which is also good news. No fevers, chills, nausea, vomiting, or diarrhea. Unfortunately she did have a fall and sustained an injury to the left upper arm. She has a skin tear here this appears to be doing okay although the dressing was stuck to this this was an alginate dressing. I really think she probably needs something like Xeroform to try to prevent anything from sticking to this region. Other than that she seems to be doing quite well. 02/11/2020 on evaluation today patient's arm skin tear appears to be completely healed which is great. Unfortunately she is still having issues at this time with her foot although this seems to be showing signs of improvement it still very slow. There is no signs of active infection at this time. 03/06/2020 upon evaluation today patient appears to be doing decently well in regard to her foot ulcer. There is some minimal slough noted on the surface of the wound building up at this point. Otherwise I really feel like she is doing quite nicely with quite a bit of new granulation and epithelial growth. 03/23/2020 upon evaluation today patient appears to be doing well with regards to her wound. She has been tolerating the dressing changes without complication. Fortunately there is no evidence of active infection and overall feel like her foot ulcer is doing excellent at this point. 04/13/2020 on evaluation today patient appears to be doing well with regard to her foot ulcer. She has been tolerating the dressing changes without complication. Fortunately there is no signs of active infection at this time. Overall I feel like the wound is getting close to complete closure and this  appears to be doing quite well in my opinion. 04/27/2020 on evaluation today patient appears to be doing excellent in regard to her wound. She has been tolerating the dressing changes without  complication. Fortunately there is no signs of active infection and overall very pleased with where things stand today. In fact I feel like the wound is very close to complete resolution. 05/18/2020 upon evaluation today patient appears to be doing well with regard to her foot ulcer. There is no evidence of active infection at this time which is great news and overall very pleased with where things stand. No fevers, chills, nausea, vomiting, or diarrhea. I do feel like she is making great progress and I feel like that the wound is very tiny but again this last portion getting close has been somewhat difficult. Objective Constitutional Well-nourished and well-hydrated in no acute distress. Vitals Time Taken: 10:22 AM, Height: 62 in, Weight: 140 lbs, BMI: 25.6, Temperature: 98.4 F, Pulse: 72 bpm, Respiratory Rate: 20 breaths/min, Blood Pressure: 166/74 mmHg. Respiratory normal breathing without difficulty. Psychiatric this patient is able to make decisions and demonstrates good insight into disease process. Alert and Oriented x 3. pleasant and cooperative. General Notes: Upon inspection patient showed no signs of active infection at this time. There is no fevers, chills, nausea, vomiting, or diarrhea noted at this point. With that being said I do feel like she is unfortunately just not seeing the improvement that we really want to see at this point. I does get it completely closed. That is a big concern here. Integumentary (Hair, Skin) Wound #3 status is Open. Original cause of wound was Gradually Appeared. The wound is located on the Right Metatarsal head fifth. The wound measures 0.2cm length x 0.3cm width x 0.3cm depth; 0.047cm^2 area and 0.014cm^3 volume. There is Fat Layer (Subcutaneous Tissue) exposed.  There is no tunneling or undermining noted. There is a medium amount of serosanguineous drainage noted. The wound margin is distinct with the outline attached to the wound base. There is large (67-100%) pink, pale, hyper - granulation within the wound bed. There is a small (1-33%) amount of necrotic tissue within the wound bed including Adherent Slough. Assessment Active Problems ICD-10 Other specified peripheral vascular diseases Non-pressure chronic ulcer of other part of right foot with fat layer exposed Unspecified open wound of left upper arm, initial encounter Long term (current) use of anticoagulants [name], Crystal S. ([id number]) Paroxysmal atrial fibrillation Tinea pedis Plan Follow-up Appointments: Return Appointment in 3 weeks. Home Health: Wound #3 Right Metatarsal head fifth: [place]: - [place] for wound care. May utilize formulary equivalent dressing for wound treatment orders unless otherwise specified. Home Health Nurse may visit PRN to address patient s wound care needs. Scheduled days for dressing changes to be completed; exception, patient has scheduled wound care visit that day. **Please direct any NON-WOUND related issues/requests for orders to patient's Primary Care Physician. **If current dressing causes regression in wound condition, may D/C ordered dressing product/s and apply Normal Saline Moist Dressing daily until next [place] or Other MD appointment. **Notify [facility] of regression in wound condition at [phone]. WOUND #3: - Metatarsal head fifth Wound Laterality: Right Cleanser: Normal Saline (Generic) 2 x Per Week/30 Days Discharge Instructions: Wash your hands with soap and water. Remove old dressing, discard into plastic bag and place into trash. Cleanse the wound with Normal Saline prior to applying a clean dressing using gauze sponges, not tissues or cotton balls. Do not scrub or  use excessive force. Pat dry using gauze sponges, not tissue or cotton balls. Primary Dressing: Prisma 4.34 (in) 2 x Per Week/30 Days Discharge Instructions: Add to wound  bed dry. Secondary Dressing: Mepilex Border Flex, 4x4 (in/in) (Generic) 2 x Per Week/30 Days Discharge Instructions: Apply to wound as directed. Do not cut. 1. Would recommend currently that we go ahead and continue with the silver collagen dressing which I think is the best way to go. 2. I am also can recommend that we continue with a border foam dressing which I do feel like has been beneficial for the patient. We will put the collagen and dry however try to cut back on some of the moisture and see if this helps. 3. If she still continues to have too much maceration then we may try a different type of dressing to manage this. Depending on how the wound looks. We will see patient back for reevaluation in 1 week here in the clinic. If anything worsens or changes patient will contact our office for additional recommendations. Electronic Signature(s) Signed: 05/18/2020 3:24:23 PM By: Worthy Keeler PA-C Entered By: Worthy Keeler on 05/18/2020 15:24:23 [name], [name] ([id number]) -------------------------------------------------------------------------------- SuperBill Details Patient Name: [name] Date of Service: 05/18/2020 Medical Record Number: [id number] Patient Account Number: [id number] Date of Birth/Sex: 1929/04/27 (87 y.o. F) Treating RN: Dolan Amen Primary Care Provider: Ria Bush Other Clinician: Referring Provider: Ria Bush Treating Provider/Extender: Skipper Cliche in Treatment: 31 Diagnosis Coding ICD-10 Codes Code Description I73.89 Other specified peripheral vascular diseases L97.512 Non-pressure chronic ulcer of other part of right foot with fat layer exposed S41.102A Unspecified open wound of left upper arm, initial encounter Z79.01 Long term (current) use of  anticoagulants I48.0 Paroxysmal atrial fibrillation B35.3 Tinea pedis Facility Procedures CPT4 Code: 89211941 Description: 99213 - WOUND CARE VISIT-LEV 3 EST PT Modifier: Quantity: 1 Physician Procedures CPT4 Code: 7408144 Description: 81856 - WC PHYS LEVEL 3 - EST PT Modifier: Quantity: 1 CPT4 Code: Description: ICD-10 Diagnosis Description I73.89 Other specified peripheral vascular diseases L97.512 Non-pressure chronic ulcer of other part of right foot with fat layer e S41.102A Unspecified open wound of left upper arm, initial encounter Z79.01 Long  term (current) use of anticoagulants Modifier: xposed Quantity: Electronic Signature(s) Signed: 05/18/2020 3:24:48 PM By: Worthy Keeler PA-C Previous Signature: 05/18/2020 12:49:19 PM Version By: Georges Mouse, Minus Breeding RN Entered By: Worthy Keeler on 05/18/2020 15:24:47

## 2020-05-18 NOTE — Progress Notes (Signed)
Crystal, Payne ([id number]) Visit Report for 05/18/2020 Arrival Information Details Patient Name: [name], [name]. Date of Service: 05/18/2020 10:00 AM Medical Record Number: [id number] Patient Account Number: [id number] Date of Birth/Sex: 1929/06/24 (87 y.o. F) Treating RN: Carlene Coria Primary Care Zaire Levesque: Ria Bush Other Clinician: Referring Kardell Virgil: Ria Bush Treating Ameia Morency/Extender: Skipper Cliche in Treatment: 10 Visit Information History Since Last Visit All ordered tests and consults were completed: No Patient Arrived: Gilford Rile Added or deleted any medications: No Arrival Time: 10:16 Any new allergies or adverse reactions: No Accompanied By: daughter Had a fall or experienced change in No Transfer Assistance: None activities of daily living that may affect Patient Identification Verified: Yes risk of falls: Secondary Verification Process Completed: Yes Signs or symptoms of abuse/neglect since last visito No Patient Requires Transmission-Based No Hospitalized since last visit: No Precautions: Implantable device outside of the clinic excluding No Patient Has Alerts: Yes cellular tissue based products placed in the center Patient Alerts: 08/31/19 ABI R)1.3 L) since last visit: 1.31 Has Dressing in Place as Prescribed: Yes 08/31/19 TBI R).61 Pain Present Now: No L).58 Electronic Signature(s) Signed: 05/18/2020 5:17:48 PM By: Carlene Coria RN Entered By: Carlene Coria on 05/18/2020 10:22:04 [name] ([id number]) -------------------------------------------------------------------------------- Clinic Level of Care Assessment Details Patient Name: [name]. Date of Service: 05/18/2020 10:00 AM Medical Record Number: [id number] Patient Account Number: [id number] Date of Birth/Sex: [birth year] (87 y.o. F) Treating RN: Dolan Amen Primary Care Eliyanna Ault: Ria Bush Other Clinician: Referring Kobi Aller: Ria Bush Treating  Nairi Oswald/Extender: Skipper Cliche in Treatment: 31 Clinic Level of Care Assessment Items TOOL 4 Quantity Score X - Use when only an EandM is performed on FOLLOW-UP visit 1 0 ASSESSMENTS - Nursing Assessment / Reassessment X - Reassessment of Co-morbidities (includes updates in patient status) 1 10 X- 1 5 Reassessment of Adherence to Treatment Plan ASSESSMENTS - Wound and Skin Assessment / Reassessment X - Simple Wound Assessment / Reassessment - one wound 1 5 []  - 0 Complex Wound Assessment / Reassessment - multiple wounds []  - 0 Dermatologic / Skin Assessment (not related to wound area) ASSESSMENTS - Focused Assessment []  - Circumferential Edema Measurements - multi extremities 0 []  - 0 Nutritional Assessment / Counseling / Intervention []  - 0 Lower Extremity Assessment (monofilament, tuning fork, pulses) []  - 0 Peripheral Arterial Disease Assessment (using hand held doppler) ASSESSMENTS - Ostomy and/or Continence Assessment and Care []  - Incontinence Assessment and Management 0 []  - 0 Ostomy Care Assessment and Management (repouching, etc.) PROCESS - Coordination of Care X - Simple Patient / Family Education for ongoing care 1 15 []  - 0 Complex (extensive) Patient / Family Education for ongoing care []  - 0 Staff obtains Programmer, systems, Records, Test Results / Process Orders []  - 0 Staff telephones HHA, Nursing Homes / Clarify orders / etc []  - 0 Routine Transfer to another Facility (non-emergent condition) []  - 0 Routine Hospital Admission (non-emergent condition) []  - 0 New Admissions / Biomedical engineer / Ordering NPWT, Apligraf, etc. []  - 0 Emergency Hospital Admission (emergent condition) X- 1 10 Simple Discharge Coordination []  - 0 Complex (extensive) Discharge Coordination PROCESS - Special Needs []  - Pediatric / Minor Patient Management 0 []  - 0 Isolation Patient Management []  - 0 Hearing / Language / Visual special needs []  - 0 Assessment of  Community assistance (transportation, D/C planning, etc.) []  - 0 Additional assistance / Altered mentation []  - 0 Support Surface(s) Assessment (bed, cushion, seat, etc.) INTERVENTIONS - Wound Cleansing / Measurement  MEENA, BARRANTES ([id number]) X- 1 5 Simple Wound Cleansing - one wound []  - 0 Complex Wound Cleansing - multiple wounds X- 1 5 Wound Imaging (photographs - any number of wounds) []  - 0 Wound Tracing (instead of photographs) X- 1 5 Simple Wound Measurement - one wound []  - 0 Complex Wound Measurement - multiple wounds INTERVENTIONS - Wound Dressings []  - Small Wound Dressing one or multiple wounds 0 X- 1 15 Medium Wound Dressing one or multiple wounds []  - 0 Large Wound Dressing one or multiple wounds []  - 0 Application of Medications - topical []  - 0 Application of Medications - injection INTERVENTIONS - Miscellaneous []  - External ear exam 0 []  - 0 Specimen Collection (cultures, biopsies, blood, body fluids, etc.) []  - 0 Specimen(s) / Culture(s) sent or taken to Lab for analysis []  - 0 Patient Transfer (multiple staff / Civil Service fast streamer / Similar devices) []  - 0 Simple Staple / Suture removal (25 or less) []  - 0 Complex Staple / Suture removal (26 or more) []  - 0 Hypo / Hyperglycemic Management (close monitor of Blood Glucose) []  - 0 Ankle / Brachial Index (ABI) - do not check if billed separately X- 1 5 Vital Signs Has the patient been seen at the hospital within the last three years: Yes Total Score: 80 Level Of Care: New/Established - Level 3 Electronic Signature(s) Signed: 05/18/2020 12:49:19 PM By: Georges Mouse, Minus Breeding RN Entered By: Georges Mouse, Minus Breeding on 05/18/2020 10:40:06 [name] ([id number]) -------------------------------------------------------------------------------- Encounter Discharge Information Details Patient Name: [name] Date of Service: 05/18/2020 10:00 AM Medical Record Number: [id number] Patient Account  Number: [id number] Date of Birth/Sex: [birth year] (87 y.o. F) Treating RN: Dolan Amen Primary Care Hashim Eichhorst: Ria Bush Other Clinician: Referring Citlali Gautney: Ria Bush Treating Cordarious Zeek/Extender: Skipper Cliche in Treatment: 31 Encounter Discharge Information Items Discharge Condition: Stable Ambulatory Status: Walker Discharge Destination: Home Transportation: Private Auto Accompanied By: daughter Schedule Follow-up Appointment: Yes Clinical Summary of Care: Electronic Signature(s) Signed: 05/18/2020 12:49:19 PM By: Georges Mouse, Minus Breeding RN Entered By: Georges Mouse, Minus Breeding on 05/18/2020 10:43:50 [name] ([id number]) -------------------------------------------------------------------------------- Lower Extremity Assessment Details Patient Name: [name] Date of Service: 05/18/2020 10:00 AM Medical Record Number: [id number] Patient Account Number: [id number] Date of Birth/Sex: [birth year] (87 y.o. F) Treating RN: Carlene Coria Primary Care Ethin Drummond: Ria Bush Other Clinician: Referring Shantera Monts: Ria Bush Treating Makalia Bare/Extender: Jeri Cos Weeks in Treatment: 31 Edema Assessment Assessed: [Left: No] [Right: Yes] Edema: [Left: Ye] [Right: s] Calf Left: Right: Point of Measurement: From Medial Instep 30 cm Ankle Left: Right: Point of Measurement: From Medial Instep 18 cm Vascular Assessment Pulses: Dorsalis Pedis Palpable: [Right:Yes] Electronic Signature(s) Signed: 05/18/2020 5:17:48 PM By: Carlene Coria RN Entered By: Carlene Coria on 05/18/2020 10:29:44 [name] ([id number]) -------------------------------------------------------------------------------- Multi Wound Chart Details Patient Name: [name]. Date of Service: 05/18/2020 10:00 AM Medical Record Number: [id number] Patient Account Number: [id number] Date of Birth/Sex: [birth year] (87 y.o. F) Treating RN: Dolan Amen Primary Care Bryer Cozzolino:  Ria Bush Other Clinician: Referring Young Mulvey: Ria Bush Treating Can Lucci/Extender: Skipper Cliche in Treatment: 31 Vital Signs Height(in): 62 Pulse(bpm): 73 Weight(lbs): 140 Blood Pressure(mmHg): 166/74 Body Mass Index(BMI): 26 Temperature(F): 98.4 Respiratory Rate(breaths/min): 20 Photos: [N/A:N/A] Wound Location: Right Metatarsal head fifth N/A N/A Wounding Event: Gradually Appeared N/A N/A Primary Etiology: Arterial Insufficiency Ulcer N/A N/A Comorbid History: Arrhythmia, Osteoarthritis N/A N/A Date Acquired: 04/09/2019 N/A N/A Weeks of Treatment: 31 N/A N/A Wound Status: Open N/A N/A Measurements L x  W x D (cm) 0.2x0.3x0.3 N/A N/A Area (cm) : 0.047 N/A N/A Volume (cm) : 0.014 N/A N/A % Reduction in Area: 95.80% N/A N/A % Reduction in Volume: 87.60% N/A N/A Classification: Full Thickness Without Exposed N/A N/A Support Structures Exudate Amount: Medium N/A N/A Exudate Type: Serosanguineous N/A N/A Exudate Color: red, brown N/A N/A Wound Margin: Distinct, outline attached N/A N/A Granulation Amount: Large (67-100%) N/A N/A Granulation Quality: Pink, Pale, Hyper-granulation N/A N/A Necrotic Amount: Small (1-33%) N/A N/A Exposed Structures: Fat Layer (Subcutaneous Tissue): N/A N/A Yes Fascia: No Tendon: No Muscle: No Joint: No Bone: No Epithelialization: Small (1-33%) N/A N/A Treatment Notes Electronic Signature(s) Signed: 05/18/2020 12:49:19 PM By: Georges Mouse, Minus Breeding RN Entered By: Georges Mouse, Minus Breeding on 05/18/2020 10:37:43 [name] ([id number]) -------------------------------------------------------------------------------- [place] Details Patient Name: [name] Date of Service: 05/18/2020 10:00 AM Medical Record Number: [id number] Patient Account Number: [id number] Date of Birth/Sex: 10-May-1929 (87 y.o. F) Treating RN: Dolan Amen Primary Care Noel Rodier: Ria Bush Other  Clinician: Referring Glema Takaki: Ria Bush Treating Bhavana Kady/Extender: Skipper Cliche in Treatment: 47 Active Inactive Abuse / Safety / Falls / Self Care Management Nursing Diagnoses: Potential for falls Goals: Patient/caregiver will verbalize understanding of skin care regimen Date Initiated: 10/12/2019 Target Resolution Date: 05/12/2020 Goal Status: Active Interventions: Assess fall risk on admission and as needed Notes: Electronic Signature(s) Signed: 05/18/2020 12:49:19 PM By: Georges Mouse, Minus Breeding RN Entered By: Georges Mouse, Minus Breeding on 05/18/2020 10:37:24 [name] ([id number]) -------------------------------------------------------------------------------- Pain Assessment Details Patient Name: [name]. Date of Service: 05/18/2020 10:00 AM Medical Record Number: [id number] Patient Account Number: [id number] Date of Birth/Sex: 1929-05-11 (87 y.o. F) Treating RN: Carlene Coria Primary Care Angeliyah Kirkey: Ria Bush Other Clinician: Referring Oluwadamilola Deliz: Ria Bush Treating Tiani Stanbery/Extender: Skipper Cliche in Treatment: 31 Active Problems Location of Pain Severity and Description of Pain Patient Has Paino No Site Locations Pain Management and Medication Current Pain Management: Electronic Signature(s) Signed: 05/18/2020 5:17:48 PM By: Carlene Coria RN Entered By: Carlene Coria on 05/18/2020 10:23:10 [name] ([id number]) -------------------------------------------------------------------------------- Patient/Caregiver Education Details Patient Name: [name] Date of Service: 05/18/2020 10:00 AM Medical Record Number: [id number] Patient Account Number: [id number] Date of Birth/Gender: 10/28/1929 (87 y.o. F) Treating RN: Dolan Amen Primary Care Physician: Ria Bush Other Clinician: Referring Physician: Ria Bush Treating Physician/Extender: Skipper Cliche in Treatment: 33 Education  Assessment Education Provided To: Patient Education Topics Provided Wound/Skin Impairment: Methods: Explain/Verbal Responses: State content correctly Electronic Signature(s) Signed: 05/18/2020 12:49:19 PM By: Georges Mouse, Minus Breeding RN Entered By: Georges Mouse, Minus Breeding on 05/18/2020 10:40:26 [name] ([id number]) -------------------------------------------------------------------------------- Wound Assessment Details Patient Name: [name] Date of Service: 05/18/2020 10:00 AM Medical Record Number: [id number] Patient Account Number: [id number] Date of Birth/Sex: 10-Sep-1929 (87 y.o. F) Treating RN: Carlene Coria Primary Care Denni France: Ria Bush Other Clinician: Referring Kamori Barbier: Ria Bush Treating Jarell Mcewen/Extender: Skipper Cliche in Treatment: 31 Wound Status Wound Number: 3 Primary Etiology: Arterial Insufficiency Ulcer Wound Location: Right Metatarsal head fifth Wound Status: Open Wounding Event: Gradually Appeared Comorbid History: Arrhythmia, Osteoarthritis Date Acquired: 04/09/2019 Weeks Of Treatment: 31 Clustered Wound: No Photos Wound Measurements Length: (cm) 0.2 Width: (cm) 0.3 Depth: (cm) 0.3 Area: (cm) 0.047 Volume: (cm) 0.014 % Reduction in Area: 95.8% % Reduction in Volume: 87.6% Epithelialization: Small (1-33%) Tunneling: No Undermining: No Wound Description Classification: Full Thickness Without Exposed Support Structures Wound Margin: Distinct, outline attached Exudate Amount: Medium Exudate Type: Serosanguineous Exudate Color: red, brown Foul Odor After Cleansing:  No Slough/Fibrino Yes Wound Bed Granulation Amount: Large (67-100%) Exposed Structure Granulation Quality: Pink, Pale, Hyper-granulation Fascia Exposed: No Necrotic Amount: Small (1-33%) Fat Layer (Subcutaneous Tissue) Exposed: Yes Necrotic Quality: Adherent Slough Tendon Exposed: No Muscle Exposed: No Joint Exposed: No Bone Exposed:  No Treatment Notes Wound #3 (Metatarsal head fifth) Wound Laterality: Right Cleanser Normal Saline Discharge Instruction: Wash your hands with soap and water. Remove old dressing, discard into plastic bag and place into trash. Cleanse the wound with Normal Saline prior to applying a clean dressing using gauze sponges, not tissues or cotton balls. Do not scrub or use excessive force. Pat dry using gauze sponges, not tissue or cotton balls. AHMAYA, OSTERMILLER ([id number]) Peri-Wound Care Topical Primary Dressing Prisma 4.34 (in) Discharge Instruction: Add to wound bed dry. Secondary Dressing Mepilex Border Flex, 4x4 (in/in) Discharge Instruction: Apply to wound as directed. Do not cut. Secured With Compression Wrap Compression Stockings Environmental education officer) Signed: 05/18/2020 5:17:48 PM By: Carlene Coria RN Entered By: Carlene Coria on 05/18/2020 10:28:39 SIREEN, HALK ([id number]) -------------------------------------------------------------------------------- Vitals Details Patient Name: [name] Date of Service: 05/18/2020 10:00 AM Medical Record Number: [id number] Patient Account Number: [id number] Date of Birth/Sex: Feb 03, 1930 (87 y.o. F) Treating RN: Carlene Coria Primary Care Steele Ledonne: Ria Bush Other Clinician: Referring Yatzary Merriweather: Ria Bush Treating Valentine Kuechle/Extender: Skipper Cliche in Treatment: 31 Vital Signs Time Taken: 10:22 Temperature (F): 98.4 Height (in): 62 Pulse (bpm): 72 Weight (lbs): 140 Respiratory Rate (breaths/min): 20 Body Mass Index (BMI): 25.6 Blood Pressure (mmHg): 166/74 Reference Range: 80 - 120 mg / dl Electronic Signature(s) Signed: 05/18/2020 5:17:48 PM By: Carlene Coria RN Entered By: Carlene Coria on 05/18/2020 10:22:45

## 2020-05-19 DIAGNOSIS — E7849 Other hyperlipidemia: Secondary | ICD-10-CM | POA: Diagnosis not present

## 2020-05-19 DIAGNOSIS — G9009 Other idiopathic peripheral autonomic neuropathy: Secondary | ICD-10-CM | POA: Diagnosis not present

## 2020-05-19 DIAGNOSIS — I4891 Unspecified atrial fibrillation: Secondary | ICD-10-CM | POA: Diagnosis not present

## 2020-05-19 DIAGNOSIS — G2581 Restless legs syndrome: Secondary | ICD-10-CM | POA: Diagnosis not present

## 2020-05-19 DIAGNOSIS — Z792 Long term (current) use of antibiotics: Secondary | ICD-10-CM | POA: Diagnosis not present

## 2020-05-19 DIAGNOSIS — M5136 Other intervertebral disc degeneration, lumbar region: Secondary | ICD-10-CM | POA: Diagnosis not present

## 2020-05-19 DIAGNOSIS — K219 Gastro-esophageal reflux disease without esophagitis: Secondary | ICD-10-CM | POA: Diagnosis not present

## 2020-05-19 DIAGNOSIS — I872 Venous insufficiency (chronic) (peripheral): Secondary | ICD-10-CM | POA: Diagnosis not present

## 2020-05-19 DIAGNOSIS — I2510 Atherosclerotic heart disease of native coronary artery without angina pectoris: Secondary | ICD-10-CM | POA: Diagnosis not present

## 2020-05-19 DIAGNOSIS — Z9181 History of falling: Secondary | ICD-10-CM | POA: Diagnosis not present

## 2020-05-19 DIAGNOSIS — I272 Pulmonary hypertension, unspecified: Secondary | ICD-10-CM | POA: Diagnosis not present

## 2020-05-19 DIAGNOSIS — Z7901 Long term (current) use of anticoagulants: Secondary | ICD-10-CM | POA: Diagnosis not present

## 2020-05-19 DIAGNOSIS — E538 Deficiency of other specified B group vitamins: Secondary | ICD-10-CM | POA: Diagnosis not present

## 2020-05-19 DIAGNOSIS — L97513 Non-pressure chronic ulcer of other part of right foot with necrosis of muscle: Secondary | ICD-10-CM | POA: Diagnosis not present

## 2020-05-19 DIAGNOSIS — M810 Age-related osteoporosis without current pathological fracture: Secondary | ICD-10-CM | POA: Diagnosis not present

## 2020-05-19 DIAGNOSIS — Z7982 Long term (current) use of aspirin: Secondary | ICD-10-CM | POA: Diagnosis not present

## 2020-05-19 DIAGNOSIS — M1990 Unspecified osteoarthritis, unspecified site: Secondary | ICD-10-CM | POA: Diagnosis not present

## 2020-05-19 DIAGNOSIS — N1831 Chronic kidney disease, stage 3a: Secondary | ICD-10-CM | POA: Diagnosis not present

## 2020-05-19 DIAGNOSIS — S62102D Fracture of unspecified carpal bone, left wrist, subsequent encounter for fracture with routine healing: Secondary | ICD-10-CM | POA: Diagnosis not present

## 2020-05-19 DIAGNOSIS — E559 Vitamin D deficiency, unspecified: Secondary | ICD-10-CM | POA: Diagnosis not present

## 2020-05-19 DIAGNOSIS — I130 Hypertensive heart and chronic kidney disease with heart failure and stage 1 through stage 4 chronic kidney disease, or unspecified chronic kidney disease: Secondary | ICD-10-CM | POA: Diagnosis not present

## 2020-05-19 DIAGNOSIS — I5032 Chronic diastolic (congestive) heart failure: Secondary | ICD-10-CM | POA: Diagnosis not present

## 2020-05-22 DIAGNOSIS — K219 Gastro-esophageal reflux disease without esophagitis: Secondary | ICD-10-CM | POA: Diagnosis not present

## 2020-05-22 DIAGNOSIS — I272 Pulmonary hypertension, unspecified: Secondary | ICD-10-CM | POA: Diagnosis not present

## 2020-05-22 DIAGNOSIS — I872 Venous insufficiency (chronic) (peripheral): Secondary | ICD-10-CM | POA: Diagnosis not present

## 2020-05-22 DIAGNOSIS — Z792 Long term (current) use of antibiotics: Secondary | ICD-10-CM | POA: Diagnosis not present

## 2020-05-22 DIAGNOSIS — E559 Vitamin D deficiency, unspecified: Secondary | ICD-10-CM | POA: Diagnosis not present

## 2020-05-22 DIAGNOSIS — Z7901 Long term (current) use of anticoagulants: Secondary | ICD-10-CM | POA: Diagnosis not present

## 2020-05-22 DIAGNOSIS — I5032 Chronic diastolic (congestive) heart failure: Secondary | ICD-10-CM | POA: Diagnosis not present

## 2020-05-22 DIAGNOSIS — M1990 Unspecified osteoarthritis, unspecified site: Secondary | ICD-10-CM | POA: Diagnosis not present

## 2020-05-22 DIAGNOSIS — I2510 Atherosclerotic heart disease of native coronary artery without angina pectoris: Secondary | ICD-10-CM | POA: Diagnosis not present

## 2020-05-22 DIAGNOSIS — E7849 Other hyperlipidemia: Secondary | ICD-10-CM | POA: Diagnosis not present

## 2020-05-22 DIAGNOSIS — G9009 Other idiopathic peripheral autonomic neuropathy: Secondary | ICD-10-CM | POA: Diagnosis not present

## 2020-05-22 DIAGNOSIS — S62102D Fracture of unspecified carpal bone, left wrist, subsequent encounter for fracture with routine healing: Secondary | ICD-10-CM | POA: Diagnosis not present

## 2020-05-22 DIAGNOSIS — I130 Hypertensive heart and chronic kidney disease with heart failure and stage 1 through stage 4 chronic kidney disease, or unspecified chronic kidney disease: Secondary | ICD-10-CM | POA: Diagnosis not present

## 2020-05-22 DIAGNOSIS — L97513 Non-pressure chronic ulcer of other part of right foot with necrosis of muscle: Secondary | ICD-10-CM | POA: Diagnosis not present

## 2020-05-22 DIAGNOSIS — I4891 Unspecified atrial fibrillation: Secondary | ICD-10-CM | POA: Diagnosis not present

## 2020-05-22 DIAGNOSIS — M5136 Other intervertebral disc degeneration, lumbar region: Secondary | ICD-10-CM | POA: Diagnosis not present

## 2020-05-22 DIAGNOSIS — Z7982 Long term (current) use of aspirin: Secondary | ICD-10-CM | POA: Diagnosis not present

## 2020-05-22 DIAGNOSIS — N1831 Chronic kidney disease, stage 3a: Secondary | ICD-10-CM | POA: Diagnosis not present

## 2020-05-22 DIAGNOSIS — Z9181 History of falling: Secondary | ICD-10-CM | POA: Diagnosis not present

## 2020-05-22 DIAGNOSIS — G2581 Restless legs syndrome: Secondary | ICD-10-CM | POA: Diagnosis not present

## 2020-05-22 DIAGNOSIS — E538 Deficiency of other specified B group vitamins: Secondary | ICD-10-CM | POA: Diagnosis not present

## 2020-05-22 DIAGNOSIS — M810 Age-related osteoporosis without current pathological fracture: Secondary | ICD-10-CM | POA: Diagnosis not present

## 2020-05-23 ENCOUNTER — Encounter (INDEPENDENT_AMBULATORY_CARE_PROVIDER_SITE_OTHER): Payer: Self-pay | Admitting: Vascular Surgery

## 2020-05-23 ENCOUNTER — Other Ambulatory Visit: Payer: Self-pay | Admitting: Cardiovascular Disease

## 2020-05-23 ENCOUNTER — Telehealth: Payer: Self-pay

## 2020-05-23 ENCOUNTER — Ambulatory Visit (INDEPENDENT_AMBULATORY_CARE_PROVIDER_SITE_OTHER): Payer: Medicare Other

## 2020-05-23 ENCOUNTER — Ambulatory Visit (INDEPENDENT_AMBULATORY_CARE_PROVIDER_SITE_OTHER): Payer: Medicare Other | Admitting: Vascular Surgery

## 2020-05-23 ENCOUNTER — Other Ambulatory Visit: Payer: Self-pay

## 2020-05-23 VITALS — BP 152/76 | HR 68 | Resp 15 | Ht 62.0 in | Wt 129.0 lb

## 2020-05-23 DIAGNOSIS — Z7901 Long term (current) use of anticoagulants: Secondary | ICD-10-CM | POA: Diagnosis not present

## 2020-05-23 DIAGNOSIS — S62102D Fracture of unspecified carpal bone, left wrist, subsequent encounter for fracture with routine healing: Secondary | ICD-10-CM | POA: Diagnosis not present

## 2020-05-23 DIAGNOSIS — Z7982 Long term (current) use of aspirin: Secondary | ICD-10-CM | POA: Diagnosis not present

## 2020-05-23 DIAGNOSIS — I272 Pulmonary hypertension, unspecified: Secondary | ICD-10-CM | POA: Diagnosis not present

## 2020-05-23 DIAGNOSIS — K219 Gastro-esophageal reflux disease without esophagitis: Secondary | ICD-10-CM | POA: Diagnosis not present

## 2020-05-23 DIAGNOSIS — I4819 Other persistent atrial fibrillation: Secondary | ICD-10-CM | POA: Diagnosis not present

## 2020-05-23 DIAGNOSIS — E559 Vitamin D deficiency, unspecified: Secondary | ICD-10-CM | POA: Diagnosis not present

## 2020-05-23 DIAGNOSIS — I10 Essential (primary) hypertension: Secondary | ICD-10-CM

## 2020-05-23 DIAGNOSIS — M810 Age-related osteoporosis without current pathological fracture: Secondary | ICD-10-CM | POA: Diagnosis not present

## 2020-05-23 DIAGNOSIS — F419 Anxiety disorder, unspecified: Secondary | ICD-10-CM

## 2020-05-23 DIAGNOSIS — I4891 Unspecified atrial fibrillation: Secondary | ICD-10-CM | POA: Diagnosis not present

## 2020-05-23 DIAGNOSIS — I2510 Atherosclerotic heart disease of native coronary artery without angina pectoris: Secondary | ICD-10-CM | POA: Diagnosis not present

## 2020-05-23 DIAGNOSIS — G2581 Restless legs syndrome: Secondary | ICD-10-CM | POA: Diagnosis not present

## 2020-05-23 DIAGNOSIS — I739 Peripheral vascular disease, unspecified: Secondary | ICD-10-CM | POA: Diagnosis not present

## 2020-05-23 DIAGNOSIS — E538 Deficiency of other specified B group vitamins: Secondary | ICD-10-CM | POA: Diagnosis not present

## 2020-05-23 DIAGNOSIS — I130 Hypertensive heart and chronic kidney disease with heart failure and stage 1 through stage 4 chronic kidney disease, or unspecified chronic kidney disease: Secondary | ICD-10-CM | POA: Diagnosis not present

## 2020-05-23 DIAGNOSIS — M1990 Unspecified osteoarthritis, unspecified site: Secondary | ICD-10-CM | POA: Diagnosis not present

## 2020-05-23 DIAGNOSIS — G9009 Other idiopathic peripheral autonomic neuropathy: Secondary | ICD-10-CM | POA: Diagnosis not present

## 2020-05-23 DIAGNOSIS — M5136 Other intervertebral disc degeneration, lumbar region: Secondary | ICD-10-CM | POA: Diagnosis not present

## 2020-05-23 DIAGNOSIS — I872 Venous insufficiency (chronic) (peripheral): Secondary | ICD-10-CM | POA: Diagnosis not present

## 2020-05-23 DIAGNOSIS — Z9181 History of falling: Secondary | ICD-10-CM | POA: Diagnosis not present

## 2020-05-23 DIAGNOSIS — I5032 Chronic diastolic (congestive) heart failure: Secondary | ICD-10-CM | POA: Diagnosis not present

## 2020-05-23 DIAGNOSIS — L97513 Non-pressure chronic ulcer of other part of right foot with necrosis of muscle: Secondary | ICD-10-CM | POA: Diagnosis not present

## 2020-05-23 DIAGNOSIS — E7849 Other hyperlipidemia: Secondary | ICD-10-CM | POA: Diagnosis not present

## 2020-05-23 DIAGNOSIS — N1831 Chronic kidney disease, stage 3a: Secondary | ICD-10-CM | POA: Diagnosis not present

## 2020-05-23 DIAGNOSIS — Z792 Long term (current) use of antibiotics: Secondary | ICD-10-CM | POA: Diagnosis not present

## 2020-05-23 NOTE — Telephone Encounter (Signed)
Filled and in my outbox

## 2020-05-23 NOTE — Telephone Encounter (Signed)
Received faxed [place] form for wound care from [facility].  Placed form in Dr. Synthia Innocent box.

## 2020-05-23 NOTE — Progress Notes (Signed)
MRN : [id number]  [name] is a 87 y.o. (May 01, 1929) female who presents with chief complaint of  Chief Complaint  Patient presents with  . Follow-up    ultrasound  .  History of Present Illness: Patient returns today in follow up of her PAD.  She reports her legs are actually doing quite well.  She still has some cyanosis particularly in the left foot this is better.  The wound on the right foot is finally healing after almost 2 years.  She has previously undergone tibial intervention.  Her ABIs today are 1.08 on the right and 1.11 on the left with multiphasic waveforms and digital pressures of 90 on the right and 97 on the left.  Current Outpatient Medications  Medication Sig Dispense Refill  . acetaminophen (TYLENOL) 500 MG tablet Take 1,000 mg every 8 (eight) hours as needed by mouth for moderate pain.     Marland Kitchen acyclovir (ZOVIRAX) 400 MG tablet Take 400 mg by mouth 2 (two) times daily.    Marland Kitchen amiodarone (PACERONE) 100 MG tablet Take 1 tablet (100 mg total) by mouth daily. (Patient taking differently: Take 100 mg by mouth daily. Takes 1/2 tablet in morning.)    . aspirin EC 81 MG tablet Take 1 tablet (81 mg total) by mouth daily. 150 tablet 2  . atorvastatin (LIPITOR) 20 MG tablet TAKE 1 TABLET BY MOUTH ONCE DAILY 90 tablet 1  . DULoxetine (CYMBALTA) 60 MG capsule TAKE 1 CAPSULE BY MOUTH ONCE DAILY 90 capsule 1  . ELIQUIS 2.5 MG TABS tablet TAKE 1 TABLET BY MOUTH TWICE DAILY 180 tablet 1  . gabapentin (NEURONTIN) 300 MG capsule Take 1 capsule (300 mg total) by mouth at bedtime. With second dose as needed    . lisinopril (ZESTRIL) 10 MG tablet Take 1 tablet (10 mg total) by mouth daily. 30 tablet 6  . memantine (NAMENDA) 5 MG tablet TAKE 1 TABLET BY MOUTH TWICE DAILY 60 tablet 6  . metoprolol tartrate (LOPRESSOR) 25 MG tablet TAKE 1/2 TABLET BY MOUTH TWICE DAILY 90 tablet 3  . pantoprazole (PROTONIX) 40 MG tablet TAKE 1 TABLET BY MOUTH EVERY DAY 90 tablet 1  . polyethylene glycol  (MIRALAX / GLYCOLAX) 17 g packet Take 17 g by mouth daily as needed for mild constipation. 14 each 0  . prednisoLONE acetate (PRED FORTE) 1 % ophthalmic suspension Place 1 drop into the left eye daily.     Marland Kitchen rOPINIRole (REQUIP) 1 MG tablet Take 2.5 tablets (2.5 mg total) by mouth at bedtime. 225 tablet 1  . senna (SENOKOT) 8.6 MG TABS tablet Take 1 tablet (8.6 mg total) by mouth daily as needed for mild constipation.    . traMADol (ULTRAM) 50 MG tablet Take 50 mg by mouth daily. Takes 2 tablets daily  0  . Vitamin D, Ergocalciferol, (DRISDOL) 1.25 MG (50000 UNIT) CAPS capsule TAKE 1 CAPSULE BY MOUTH ONCE WEEKLY 12 capsule 3  . neomycin-polymyxin-hydrocortisone (CORTISPORIN) 3.5-10000-1 OTIC suspension SMARTSIG:In Ear(s)     No current facility-administered medications for this visit.    Past Medical History:  Diagnosis Date  . Abdominal aortic atherosclerosis ([place]) 09/2015   by xray  . Anxiety   . BCC (basal cell carcinoma of skin) 05/2011   on nose  . Breast lesion    a. diagnosed with complex sclerosing lesion with calcifications of the right breast in 08/2016  . Chest pain    a. nuclear stress test 03/2012: normal; b. 09/2018 MV: EF >  65%, no isch/scar.   . DDD (degenerative disc disease), lumbosacral 12/06/1992  . Gallstones 09/2015   incidentally by xray  . GERD (gastroesophageal reflux disease) 05/1999  . HERPES ZOSTER 04/13/2007   Qualifier: Diagnosis of  By: [name], [name]   . Hiatal hernia   . History of shingles    ophthalmic, takes acyclovir daily  . Hyperlipidemia 12/2003  . Hypertension 1990  . Mitral regurgitation    a. echo 03/2012: EF 60-65%, no RWMA, mild to mod AI/MR, mod TR, PASP 37 mmHg  . Osteoarthritis 08/21/1989  . Osteoporosis with fracture 11/1997   with compression fracture T12  . Persistent atrial fibrillation ([place]) 03/16/2012   a. CHADS2VASc => 5 (HTN, age x 2, vascular disease, sex category)-->Eliquis 5 BID;  b. Recurrent AF in 06/2016;  c.  01/2017 s/p DCCV;  d. 02/2017 recurrent AFib->Amio added->DCCV; e. 03/2017 recurrent AFib, s/p reload of amio and DCCV 04/02/2017  . POSTHERPETIC NEURALGIA 04/20/2007   Qualifier: Diagnosis of  By: [name], [name]   . Rheumatoid arthritis ([place])   . RLS (restless legs syndrome)     Past Surgical History:  Procedure Laterality Date  . ABDOMINAL HYSTERECTOMY  Age 43 - 17   S/P TAH  . abdominal ultrasound  04/23/2004   Gallstones  . BREAST EXCISIONAL BIOPSY Right 2018   neg/benign complex sclerosing lesion  . BREAST LUMPECTOMY WITH RADIOACTIVE SEED LOCALIZATION Right 10/2016   breast lumpectomy with radioactive seed localization and margin assessment for complex sclerosing lesion [facility])  . BREAST LUMPECTOMY WITH RADIOACTIVE SEED LOCALIZATION Right 11/05/2016   Procedure: RIGHT BREAST LUMPECTOMY WITH RADIOACTIVE SEED LOCALIZATION;  Surgeon: Fanny Skates, MD;  Location: [facility] [place];  Service: General;  Laterality: Right;  . CARDIAC CATHETERIZATION    . CARDIOVERSION N/A 01/31/2017   Procedure: CARDIOVERSION;  Surgeon: Wellington Hampshire, MD;  Location: [facility];  Service: Cardiovascular;  Laterality: N/A;  . CARDIOVERSION N/A 03/03/2017   Procedure: CARDIOVERSION;  Surgeon: Wellington Hampshire, MD;  Location: [place] [facility];  Service: Cardiovascular;  Laterality: N/A;  . CARDIOVERSION N/A 04/02/2017   Procedure: CARDIOVERSION;  Surgeon: Wellington Hampshire, MD;  Location: [facility];  Service: Cardiovascular;  Laterality: N/A;  . CARDIOVERSION N/A 01/16/2018   Procedure: CARDIOVERSION;  Surgeon: Wellington Hampshire, MD;  Location: [facility];  Service: Cardiovascular;  Laterality: N/A;  . CATARACT EXTRACTION  05/2010   left eye  . CERVICAL DISCECTOMY  1992   Fusion (Dr. Joya Salm)  . ESI Bilateral 01/2016   S1 transforaminal ESI x3  . ESOPHAGOGASTRODUODENOSCOPY  01/01/2002   with ulcer, bx. neg; + stricture gastric ulcer with hemorrhage  . ESOPHAGOGASTRODUODENOSCOPY   04/23/2004   Esophageal stricture -- dilated  . INTRAVASCULAR PRESSURE WIRE/FFR STUDY N/A 07/12/2019   Procedure: INTRAVASCULAR PRESSURE WIRE/FFR STUDY;  Surgeon: Wellington Hampshire, MD;  Location: [place] [facility];  Service: Cardiovascular;  Laterality: N/A;  . LOWER EXTREMITY ANGIOGRAPHY Left 02/01/2019   LOWER EXTREMITY ANGIOGRAPHY;  Surgeon: Algernon Huxley, MD  . LOWER EXTREMITY ANGIOGRAPHY Right 03/25/2019   Procedure: LOWER EXTREMITY ANGIOGRAPHY;  Surgeon: Algernon Huxley, MD;  Location: [place] [facility];  Service: Cardiovascular;  Laterality: Right;  . LOWER EXTREMITY ANGIOGRAPHY Right 08/02/2019   Procedure: LOWER EXTREMITY ANGIOGRAPHY;  Surgeon: Algernon Huxley, MD;  Location: [place] [facility];  Service: Cardiovascular;  Laterality: Right;  . nuclear stress test  03/2012   no ischemia  . PERIPHERAL VASCULAR BALLOON ANGIOPLASTY Left 01/2019  Percutaneous transluminal angioplasty of left anterior and posterior tibial artery with 2.5 mm diameter by 22 cm length angioplasty balloon (Joslynn Jamroz)  . RIGHT/LEFT HEART CATH AND CORONARY ANGIOGRAPHY Bilateral 07/12/2019   Procedure: RIGHT/LEFT HEART CATH AND CORONARY ANGIOGRAPHY;  Surgeon: Wellington Hampshire, MD;  Location: [place] [facility];  Service: Cardiovascular;  Laterality: Bilateral;  . SKIN CANCER EXCISION  05/20/2011   BCC from nose  . US ECHOCARDIOGRAPHY  03/2012   in flutter, EF 60%, mild-mod aortic, mitral, tricuspid regurg, mildly dilated LA     Social History   Tobacco Use  . Smoking status: Former Smoker    Packs/day: 0.25    Years: 1.00    Pack years: 0.25    Quit date: 04/08/1976    Years since quitting: 44.1  . Smokeless tobacco: Never Used  . Tobacco comment: pt smokes occasionally "socially"  Vaping Use  . Vaping Use: Never used  Substance Use Topics  . Alcohol use: No    Alcohol/week: 1.0 standard drink    Types: 1 Glasses of wine per week  . Drug use: No      Family History  Problem Relation Age of Onset  .  Emphysema Mother   . Heart disease Father        MI  . Stroke Father        first at age 91.  . Stroke Sister   . Diabetes Sister   . Hypertension Brother   . Heart disease Brother        Heart Valve  . Stroke Brother   . Diabetes Brother   . Cancer Brother        esophageal  . Diabetes Brother   . Diabetes Brother   . Colon cancer Neg Hx      Allergies  Allergen Reactions  . Penicillins Rash and Other (See Comments)    Childhood Allergy Has patient had a PCN reaction causing immediate rash, facial/tongue/throat swelling, SOB or lightheadedness with hypotension: Yes Has patient had a PCN reaction causing severe rash involving mucus membranes or skin necrosis: Unknown Has patient had a PCN reaction that required hospitalization: No Has patient had a PCN reaction occurring within the last 10 years: No If all of the above answers are "NO", then may proceed with Cephalosporin use.       REVIEW OF SYSTEMS (Negative unless checked)  Constitutional: [] ?Weight loss  [] ?Fever  [] ?Chills Cardiac: [] ?Chest pain   [] ?Chest pressure   [x] ?Palpitations   [] ?Shortness of breath when laying flat   [] ?Shortness of breath at rest   [x] ?Shortness of breath with exertion. Vascular:  [x] ?Pain in legs with walking   [x] ?Pain in legs at rest   [] ?Pain in legs when laying flat   [] ?Claudication   [] ?Pain in feet when walking  [] ?Pain in feet at rest  [] ?Pain in feet when laying flat   [] ?History of DVT   [] ?Phlebitis   [x] ?Swelling in legs   [] ?Varicose veins   [] ?Non-healing ulcers Pulmonary:   [] ?Uses home oxygen   [] ?Productive cough   [] ?Hemoptysis   [] ?Wheeze  [] ?COPD   [] ?Asthma Neurologic:  [] ?Dizziness  [] ?Blackouts   [] ?Seizures   [] ?History of stroke   [] ?History of TIA  [] ?Aphasia   [] ?Temporary blindness   [] ?Dysphagia   [] ?Weakness or numbness in arms   [x] ?Weakness or numbness in legs Musculoskeletal:  [x] ?Arthritis   [] ?Joint swelling   [] ?Joint pain   [] ?Low back pain Hematologic:   [] ?Easy bruising  [] ?Easy bleeding   [] ?  Hypercoagulable state   [] ?Anemic  [] ?Hepatitis Gastrointestinal:  [] ?Blood in stool   [] ?Vomiting blood  [] ?Gastroesophageal reflux/heartburn   [] ?Abdominal pain Genitourinary:  [] ?Chronic kidney disease   [] ?Difficult urination  [] ?Frequent urination  [] ?Burning with urination   [] ?Hematuria Skin:  [] ?Rashes   [] ?Ulcers   [] ?Wounds Psychological:  [] ?History of anxiety   [] ? History of major depression.  Physical Examination  BP (!) 152/76 (BP Location: Right Arm)   Pulse 68   Resp 15   Ht 5\' 2"  (1.575 m)   Wt 129 lb (58.5 kg)   BMI 23.59 kg/m  Gen:  WD/WN, NAD Head: [place]/AT, No temporalis wasting. Ear/Nose/Throat: Hearing grossly intact, nares w/o erythema or drainage Eyes: Conjunctiva clear. Sclera non-icteric Neck: Supple.  Trachea midline Pulmonary:  Good air movement, no use of accessory muscles.  Cardiac: irregular Vascular:  Vessel Right Left  Radial Palpable Palpable                          PT 2+ Palpable 1+ Palpable  DP 1+ Palpable 2+ Palpable   Gastrointestinal: soft, non-tender/non-distended. No guarding/reflex.  Musculoskeletal: M/S 5/5 throughout.  No deformity or atrophy.  Purplish discoloration is present in the left foot and less so on the right.  No right lower extremity edema but there is 1+ left lower extremity edema. Neurologic: Sensation grossly intact in extremities.  Symmetrical.  Speech is fluent.  Psychiatric: Judgment intact, Mood & affect appropriate for pt's clinical situation.        Labs No results found for this or any previous visit (from the past 2160 hour(s)).  Radiology No results found.  Assessment/Plan Persistent atrial fibrillation Rate controlled on Pacerone  Essential hypertension blood pressure control important in reducing the progression of atherosclerotic disease. On appropriate oral medications.  PAD (peripheral artery disease) (HCC) Her ABIs today are 1.08 on the right  and 1.11 on the left with multiphasic waveforms and digital pressures of 90 on the right and 97 on the left.  Legs are doing well.  No current limb threatening symptoms.  Recheck in 6 months.    Leotis Pain, MD  05/23/2020 3:05 PM    This note was created with Dragon medical transcription system.  Any errors from dictation are purely unintentional

## 2020-05-23 NOTE — Assessment & Plan Note (Signed)
Her ABIs today are 1.08 on the right and 1.11 on the left with multiphasic waveforms and digital pressures of 90 on the right and 97 on the left.  Legs are doing well.  No current limb threatening symptoms.  Recheck in 6 months.

## 2020-05-23 NOTE — Telephone Encounter (Signed)
Refill Request.  

## 2020-05-23 NOTE — Telephone Encounter (Signed)
87f, 61.3kg, scr 2.12 10/23/19, lovw/arida

## 2020-05-24 ENCOUNTER — Other Ambulatory Visit: Payer: Self-pay

## 2020-05-24 MED ORDER — AMIODARONE HCL 100 MG PO TABS: 100.0000 mg | ORAL_TABLET | Freq: Every day | ORAL | 0 refills | Status: DC

## 2020-05-24 NOTE — Telephone Encounter (Signed)
Faxed order

## 2020-05-25 DIAGNOSIS — S62102D Fracture of unspecified carpal bone, left wrist, subsequent encounter for fracture with routine healing: Secondary | ICD-10-CM | POA: Diagnosis not present

## 2020-05-25 DIAGNOSIS — M1990 Unspecified osteoarthritis, unspecified site: Secondary | ICD-10-CM | POA: Diagnosis not present

## 2020-05-25 DIAGNOSIS — I4891 Unspecified atrial fibrillation: Secondary | ICD-10-CM | POA: Diagnosis not present

## 2020-05-25 DIAGNOSIS — I872 Venous insufficiency (chronic) (peripheral): Secondary | ICD-10-CM | POA: Diagnosis not present

## 2020-05-25 DIAGNOSIS — M810 Age-related osteoporosis without current pathological fracture: Secondary | ICD-10-CM | POA: Diagnosis not present

## 2020-05-25 DIAGNOSIS — L97513 Non-pressure chronic ulcer of other part of right foot with necrosis of muscle: Secondary | ICD-10-CM | POA: Diagnosis not present

## 2020-05-25 DIAGNOSIS — M5136 Other intervertebral disc degeneration, lumbar region: Secondary | ICD-10-CM | POA: Diagnosis not present

## 2020-05-25 DIAGNOSIS — E559 Vitamin D deficiency, unspecified: Secondary | ICD-10-CM | POA: Diagnosis not present

## 2020-05-25 DIAGNOSIS — G9009 Other idiopathic peripheral autonomic neuropathy: Secondary | ICD-10-CM | POA: Diagnosis not present

## 2020-05-25 DIAGNOSIS — N1831 Chronic kidney disease, stage 3a: Secondary | ICD-10-CM | POA: Diagnosis not present

## 2020-05-25 DIAGNOSIS — Z9181 History of falling: Secondary | ICD-10-CM | POA: Diagnosis not present

## 2020-05-25 DIAGNOSIS — K219 Gastro-esophageal reflux disease without esophagitis: Secondary | ICD-10-CM | POA: Diagnosis not present

## 2020-05-25 DIAGNOSIS — I130 Hypertensive heart and chronic kidney disease with heart failure and stage 1 through stage 4 chronic kidney disease, or unspecified chronic kidney disease: Secondary | ICD-10-CM | POA: Diagnosis not present

## 2020-05-25 DIAGNOSIS — I2510 Atherosclerotic heart disease of native coronary artery without angina pectoris: Secondary | ICD-10-CM | POA: Diagnosis not present

## 2020-05-25 DIAGNOSIS — Z7982 Long term (current) use of aspirin: Secondary | ICD-10-CM | POA: Diagnosis not present

## 2020-05-25 DIAGNOSIS — Z7901 Long term (current) use of anticoagulants: Secondary | ICD-10-CM | POA: Diagnosis not present

## 2020-05-25 DIAGNOSIS — E7849 Other hyperlipidemia: Secondary | ICD-10-CM | POA: Diagnosis not present

## 2020-05-25 DIAGNOSIS — G2581 Restless legs syndrome: Secondary | ICD-10-CM | POA: Diagnosis not present

## 2020-05-25 DIAGNOSIS — I272 Pulmonary hypertension, unspecified: Secondary | ICD-10-CM | POA: Diagnosis not present

## 2020-05-25 DIAGNOSIS — Z792 Long term (current) use of antibiotics: Secondary | ICD-10-CM | POA: Diagnosis not present

## 2020-05-25 DIAGNOSIS — E538 Deficiency of other specified B group vitamins: Secondary | ICD-10-CM | POA: Diagnosis not present

## 2020-05-25 DIAGNOSIS — I5032 Chronic diastolic (congestive) heart failure: Secondary | ICD-10-CM | POA: Diagnosis not present

## 2020-05-26 DIAGNOSIS — G2581 Restless legs syndrome: Secondary | ICD-10-CM | POA: Diagnosis not present

## 2020-05-26 DIAGNOSIS — Z792 Long term (current) use of antibiotics: Secondary | ICD-10-CM | POA: Diagnosis not present

## 2020-05-26 DIAGNOSIS — I5032 Chronic diastolic (congestive) heart failure: Secondary | ICD-10-CM | POA: Diagnosis not present

## 2020-05-26 DIAGNOSIS — I130 Hypertensive heart and chronic kidney disease with heart failure and stage 1 through stage 4 chronic kidney disease, or unspecified chronic kidney disease: Secondary | ICD-10-CM | POA: Diagnosis not present

## 2020-05-26 DIAGNOSIS — N1831 Chronic kidney disease, stage 3a: Secondary | ICD-10-CM | POA: Diagnosis not present

## 2020-05-26 DIAGNOSIS — E559 Vitamin D deficiency, unspecified: Secondary | ICD-10-CM | POA: Diagnosis not present

## 2020-05-26 DIAGNOSIS — E7849 Other hyperlipidemia: Secondary | ICD-10-CM | POA: Diagnosis not present

## 2020-05-26 DIAGNOSIS — I4891 Unspecified atrial fibrillation: Secondary | ICD-10-CM | POA: Diagnosis not present

## 2020-05-26 DIAGNOSIS — M5136 Other intervertebral disc degeneration, lumbar region: Secondary | ICD-10-CM | POA: Diagnosis not present

## 2020-05-26 DIAGNOSIS — E538 Deficiency of other specified B group vitamins: Secondary | ICD-10-CM | POA: Diagnosis not present

## 2020-05-26 DIAGNOSIS — M1990 Unspecified osteoarthritis, unspecified site: Secondary | ICD-10-CM | POA: Diagnosis not present

## 2020-05-26 DIAGNOSIS — I272 Pulmonary hypertension, unspecified: Secondary | ICD-10-CM | POA: Diagnosis not present

## 2020-05-26 DIAGNOSIS — I2510 Atherosclerotic heart disease of native coronary artery without angina pectoris: Secondary | ICD-10-CM | POA: Diagnosis not present

## 2020-05-26 DIAGNOSIS — Z7982 Long term (current) use of aspirin: Secondary | ICD-10-CM | POA: Diagnosis not present

## 2020-05-26 DIAGNOSIS — Z7901 Long term (current) use of anticoagulants: Secondary | ICD-10-CM | POA: Diagnosis not present

## 2020-05-26 DIAGNOSIS — K219 Gastro-esophageal reflux disease without esophagitis: Secondary | ICD-10-CM | POA: Diagnosis not present

## 2020-05-26 DIAGNOSIS — Z9181 History of falling: Secondary | ICD-10-CM | POA: Diagnosis not present

## 2020-05-26 DIAGNOSIS — L97513 Non-pressure chronic ulcer of other part of right foot with necrosis of muscle: Secondary | ICD-10-CM | POA: Diagnosis not present

## 2020-05-26 DIAGNOSIS — I872 Venous insufficiency (chronic) (peripheral): Secondary | ICD-10-CM | POA: Diagnosis not present

## 2020-05-26 DIAGNOSIS — S62102D Fracture of unspecified carpal bone, left wrist, subsequent encounter for fracture with routine healing: Secondary | ICD-10-CM | POA: Diagnosis not present

## 2020-05-26 DIAGNOSIS — G9009 Other idiopathic peripheral autonomic neuropathy: Secondary | ICD-10-CM | POA: Diagnosis not present

## 2020-05-26 DIAGNOSIS — M810 Age-related osteoporosis without current pathological fracture: Secondary | ICD-10-CM | POA: Diagnosis not present

## 2020-05-29 ENCOUNTER — Other Ambulatory Visit: Payer: Medicare Other | Admitting: Adult Health Nurse Practitioner

## 2020-05-29 ENCOUNTER — Telehealth: Payer: Self-pay | Admitting: Adult Health Nurse Practitioner

## 2020-05-29 ENCOUNTER — Other Ambulatory Visit: Payer: Self-pay

## 2020-05-29 DIAGNOSIS — S41112A Laceration without foreign body of left upper arm, initial encounter: Secondary | ICD-10-CM | POA: Diagnosis not present

## 2020-05-29 DIAGNOSIS — I5032 Chronic diastolic (congestive) heart failure: Secondary | ICD-10-CM | POA: Diagnosis not present

## 2020-05-29 DIAGNOSIS — Z7982 Long term (current) use of aspirin: Secondary | ICD-10-CM | POA: Diagnosis not present

## 2020-05-29 DIAGNOSIS — Z792 Long term (current) use of antibiotics: Secondary | ICD-10-CM | POA: Diagnosis not present

## 2020-05-29 DIAGNOSIS — G2581 Restless legs syndrome: Secondary | ICD-10-CM | POA: Diagnosis not present

## 2020-05-29 DIAGNOSIS — I872 Venous insufficiency (chronic) (peripheral): Secondary | ICD-10-CM | POA: Diagnosis not present

## 2020-05-29 DIAGNOSIS — Z9181 History of falling: Secondary | ICD-10-CM | POA: Diagnosis not present

## 2020-05-29 DIAGNOSIS — M5136 Other intervertebral disc degeneration, lumbar region: Secondary | ICD-10-CM | POA: Diagnosis not present

## 2020-05-29 DIAGNOSIS — G9009 Other idiopathic peripheral autonomic neuropathy: Secondary | ICD-10-CM | POA: Diagnosis not present

## 2020-05-29 DIAGNOSIS — E538 Deficiency of other specified B group vitamins: Secondary | ICD-10-CM | POA: Diagnosis not present

## 2020-05-29 DIAGNOSIS — I272 Pulmonary hypertension, unspecified: Secondary | ICD-10-CM | POA: Diagnosis not present

## 2020-05-29 DIAGNOSIS — N1831 Chronic kidney disease, stage 3a: Secondary | ICD-10-CM | POA: Diagnosis not present

## 2020-05-29 DIAGNOSIS — E7849 Other hyperlipidemia: Secondary | ICD-10-CM | POA: Diagnosis not present

## 2020-05-29 DIAGNOSIS — I130 Hypertensive heart and chronic kidney disease with heart failure and stage 1 through stage 4 chronic kidney disease, or unspecified chronic kidney disease: Secondary | ICD-10-CM | POA: Diagnosis not present

## 2020-05-29 DIAGNOSIS — K219 Gastro-esophageal reflux disease without esophagitis: Secondary | ICD-10-CM | POA: Diagnosis not present

## 2020-05-29 DIAGNOSIS — S62102D Fracture of unspecified carpal bone, left wrist, subsequent encounter for fracture with routine healing: Secondary | ICD-10-CM | POA: Diagnosis not present

## 2020-05-29 DIAGNOSIS — M810 Age-related osteoporosis without current pathological fracture: Secondary | ICD-10-CM | POA: Diagnosis not present

## 2020-05-29 DIAGNOSIS — I4891 Unspecified atrial fibrillation: Secondary | ICD-10-CM | POA: Diagnosis not present

## 2020-05-29 DIAGNOSIS — E559 Vitamin D deficiency, unspecified: Secondary | ICD-10-CM | POA: Diagnosis not present

## 2020-05-29 DIAGNOSIS — M1990 Unspecified osteoarthritis, unspecified site: Secondary | ICD-10-CM | POA: Diagnosis not present

## 2020-05-29 DIAGNOSIS — L97513 Non-pressure chronic ulcer of other part of right foot with necrosis of muscle: Secondary | ICD-10-CM | POA: Diagnosis not present

## 2020-05-29 DIAGNOSIS — Z7901 Long term (current) use of anticoagulants: Secondary | ICD-10-CM | POA: Diagnosis not present

## 2020-05-29 DIAGNOSIS — I2510 Atherosclerotic heart disease of native coronary artery without angina pectoris: Secondary | ICD-10-CM | POA: Diagnosis not present

## 2020-05-29 NOTE — Telephone Encounter (Signed)
Returned daughter's VM needing to reschedule.  Rescheduled today's visit for March 3,2022 at 9:00 B[place] Gilmartin K. Olena Heckle NP

## 2020-05-31 ENCOUNTER — Telehealth: Payer: Self-pay

## 2020-05-31 ENCOUNTER — Ambulatory Visit: Payer: Medicare Other

## 2020-05-31 DIAGNOSIS — M810 Age-related osteoporosis without current pathological fracture: Secondary | ICD-10-CM

## 2020-05-31 NOTE — Telephone Encounter (Signed)
Spoke with patients daughter (ok per DPR) regarding Prolia injection. Informed patients daughter that it will be $280 OOP. Patients daughter uncomfortable with this price. Informed patients daughter that if we ran the pharmacy benefits, the price may be the same. Patients daughter verbalized understanding, but wanted to proceed with running them. Will run pharmacy benefits.

## 2020-06-01 DIAGNOSIS — G2581 Restless legs syndrome: Secondary | ICD-10-CM | POA: Diagnosis not present

## 2020-06-01 DIAGNOSIS — I4891 Unspecified atrial fibrillation: Secondary | ICD-10-CM | POA: Diagnosis not present

## 2020-06-01 DIAGNOSIS — I130 Hypertensive heart and chronic kidney disease with heart failure and stage 1 through stage 4 chronic kidney disease, or unspecified chronic kidney disease: Secondary | ICD-10-CM | POA: Diagnosis not present

## 2020-06-01 DIAGNOSIS — L97513 Non-pressure chronic ulcer of other part of right foot with necrosis of muscle: Secondary | ICD-10-CM | POA: Diagnosis not present

## 2020-06-01 DIAGNOSIS — N1831 Chronic kidney disease, stage 3a: Secondary | ICD-10-CM | POA: Diagnosis not present

## 2020-06-01 DIAGNOSIS — Z7901 Long term (current) use of anticoagulants: Secondary | ICD-10-CM | POA: Diagnosis not present

## 2020-06-01 DIAGNOSIS — E559 Vitamin D deficiency, unspecified: Secondary | ICD-10-CM | POA: Diagnosis not present

## 2020-06-01 DIAGNOSIS — Z7982 Long term (current) use of aspirin: Secondary | ICD-10-CM | POA: Diagnosis not present

## 2020-06-01 DIAGNOSIS — S62102D Fracture of unspecified carpal bone, left wrist, subsequent encounter for fracture with routine healing: Secondary | ICD-10-CM | POA: Diagnosis not present

## 2020-06-01 DIAGNOSIS — E538 Deficiency of other specified B group vitamins: Secondary | ICD-10-CM | POA: Diagnosis not present

## 2020-06-01 DIAGNOSIS — M810 Age-related osteoporosis without current pathological fracture: Secondary | ICD-10-CM | POA: Diagnosis not present

## 2020-06-01 DIAGNOSIS — I5032 Chronic diastolic (congestive) heart failure: Secondary | ICD-10-CM | POA: Diagnosis not present

## 2020-06-01 DIAGNOSIS — I272 Pulmonary hypertension, unspecified: Secondary | ICD-10-CM | POA: Diagnosis not present

## 2020-06-01 DIAGNOSIS — G9009 Other idiopathic peripheral autonomic neuropathy: Secondary | ICD-10-CM | POA: Diagnosis not present

## 2020-06-01 DIAGNOSIS — M5136 Other intervertebral disc degeneration, lumbar region: Secondary | ICD-10-CM | POA: Diagnosis not present

## 2020-06-01 DIAGNOSIS — Z792 Long term (current) use of antibiotics: Secondary | ICD-10-CM | POA: Diagnosis not present

## 2020-06-01 DIAGNOSIS — E7849 Other hyperlipidemia: Secondary | ICD-10-CM | POA: Diagnosis not present

## 2020-06-01 DIAGNOSIS — I872 Venous insufficiency (chronic) (peripheral): Secondary | ICD-10-CM | POA: Diagnosis not present

## 2020-06-01 DIAGNOSIS — I2510 Atherosclerotic heart disease of native coronary artery without angina pectoris: Secondary | ICD-10-CM | POA: Diagnosis not present

## 2020-06-01 DIAGNOSIS — Z9181 History of falling: Secondary | ICD-10-CM | POA: Diagnosis not present

## 2020-06-01 DIAGNOSIS — M1990 Unspecified osteoarthritis, unspecified site: Secondary | ICD-10-CM | POA: Diagnosis not present

## 2020-06-01 DIAGNOSIS — K219 Gastro-esophageal reflux disease without esophagitis: Secondary | ICD-10-CM | POA: Diagnosis not present

## 2020-06-02 ENCOUNTER — Ambulatory Visit: Payer: Medicare Other | Admitting: Physician Assistant

## 2020-06-02 DIAGNOSIS — M1990 Unspecified osteoarthritis, unspecified site: Secondary | ICD-10-CM | POA: Diagnosis not present

## 2020-06-02 DIAGNOSIS — L97513 Non-pressure chronic ulcer of other part of right foot with necrosis of muscle: Secondary | ICD-10-CM | POA: Diagnosis not present

## 2020-06-02 DIAGNOSIS — I130 Hypertensive heart and chronic kidney disease with heart failure and stage 1 through stage 4 chronic kidney disease, or unspecified chronic kidney disease: Secondary | ICD-10-CM | POA: Diagnosis not present

## 2020-06-02 DIAGNOSIS — G2581 Restless legs syndrome: Secondary | ICD-10-CM | POA: Diagnosis not present

## 2020-06-02 DIAGNOSIS — Z9181 History of falling: Secondary | ICD-10-CM | POA: Diagnosis not present

## 2020-06-02 DIAGNOSIS — I272 Pulmonary hypertension, unspecified: Secondary | ICD-10-CM | POA: Diagnosis not present

## 2020-06-02 DIAGNOSIS — Z7982 Long term (current) use of aspirin: Secondary | ICD-10-CM | POA: Diagnosis not present

## 2020-06-02 DIAGNOSIS — E559 Vitamin D deficiency, unspecified: Secondary | ICD-10-CM | POA: Diagnosis not present

## 2020-06-02 DIAGNOSIS — S62102D Fracture of unspecified carpal bone, left wrist, subsequent encounter for fracture with routine healing: Secondary | ICD-10-CM | POA: Diagnosis not present

## 2020-06-02 DIAGNOSIS — I872 Venous insufficiency (chronic) (peripheral): Secondary | ICD-10-CM | POA: Diagnosis not present

## 2020-06-02 DIAGNOSIS — M810 Age-related osteoporosis without current pathological fracture: Secondary | ICD-10-CM | POA: Diagnosis not present

## 2020-06-02 DIAGNOSIS — G9009 Other idiopathic peripheral autonomic neuropathy: Secondary | ICD-10-CM | POA: Diagnosis not present

## 2020-06-02 DIAGNOSIS — M5136 Other intervertebral disc degeneration, lumbar region: Secondary | ICD-10-CM | POA: Diagnosis not present

## 2020-06-02 DIAGNOSIS — Z792 Long term (current) use of antibiotics: Secondary | ICD-10-CM | POA: Diagnosis not present

## 2020-06-02 DIAGNOSIS — Z7901 Long term (current) use of anticoagulants: Secondary | ICD-10-CM | POA: Diagnosis not present

## 2020-06-02 DIAGNOSIS — I4891 Unspecified atrial fibrillation: Secondary | ICD-10-CM | POA: Diagnosis not present

## 2020-06-02 DIAGNOSIS — E538 Deficiency of other specified B group vitamins: Secondary | ICD-10-CM | POA: Diagnosis not present

## 2020-06-02 DIAGNOSIS — I5032 Chronic diastolic (congestive) heart failure: Secondary | ICD-10-CM | POA: Diagnosis not present

## 2020-06-02 DIAGNOSIS — E7849 Other hyperlipidemia: Secondary | ICD-10-CM | POA: Diagnosis not present

## 2020-06-02 DIAGNOSIS — N1831 Chronic kidney disease, stage 3a: Secondary | ICD-10-CM | POA: Diagnosis not present

## 2020-06-02 DIAGNOSIS — I2510 Atherosclerotic heart disease of native coronary artery without angina pectoris: Secondary | ICD-10-CM | POA: Diagnosis not present

## 2020-06-02 DIAGNOSIS — K219 Gastro-esophageal reflux disease without esophagitis: Secondary | ICD-10-CM | POA: Diagnosis not present

## 2020-06-05 DIAGNOSIS — M5136 Other intervertebral disc degeneration, lumbar region: Secondary | ICD-10-CM | POA: Diagnosis not present

## 2020-06-05 DIAGNOSIS — I272 Pulmonary hypertension, unspecified: Secondary | ICD-10-CM | POA: Diagnosis not present

## 2020-06-05 DIAGNOSIS — I872 Venous insufficiency (chronic) (peripheral): Secondary | ICD-10-CM | POA: Diagnosis not present

## 2020-06-05 DIAGNOSIS — Z7901 Long term (current) use of anticoagulants: Secondary | ICD-10-CM | POA: Diagnosis not present

## 2020-06-05 DIAGNOSIS — M1990 Unspecified osteoarthritis, unspecified site: Secondary | ICD-10-CM | POA: Diagnosis not present

## 2020-06-05 DIAGNOSIS — Z792 Long term (current) use of antibiotics: Secondary | ICD-10-CM | POA: Diagnosis not present

## 2020-06-05 DIAGNOSIS — S62102D Fracture of unspecified carpal bone, left wrist, subsequent encounter for fracture with routine healing: Secondary | ICD-10-CM | POA: Diagnosis not present

## 2020-06-05 DIAGNOSIS — N1831 Chronic kidney disease, stage 3a: Secondary | ICD-10-CM | POA: Diagnosis not present

## 2020-06-05 DIAGNOSIS — I4891 Unspecified atrial fibrillation: Secondary | ICD-10-CM | POA: Diagnosis not present

## 2020-06-05 DIAGNOSIS — E7849 Other hyperlipidemia: Secondary | ICD-10-CM | POA: Diagnosis not present

## 2020-06-05 DIAGNOSIS — I2510 Atherosclerotic heart disease of native coronary artery without angina pectoris: Secondary | ICD-10-CM | POA: Diagnosis not present

## 2020-06-05 DIAGNOSIS — K219 Gastro-esophageal reflux disease without esophagitis: Secondary | ICD-10-CM | POA: Diagnosis not present

## 2020-06-05 DIAGNOSIS — G9009 Other idiopathic peripheral autonomic neuropathy: Secondary | ICD-10-CM | POA: Diagnosis not present

## 2020-06-05 DIAGNOSIS — Z9181 History of falling: Secondary | ICD-10-CM | POA: Diagnosis not present

## 2020-06-05 DIAGNOSIS — E559 Vitamin D deficiency, unspecified: Secondary | ICD-10-CM | POA: Diagnosis not present

## 2020-06-05 DIAGNOSIS — M810 Age-related osteoporosis without current pathological fracture: Secondary | ICD-10-CM | POA: Diagnosis not present

## 2020-06-05 DIAGNOSIS — E538 Deficiency of other specified B group vitamins: Secondary | ICD-10-CM | POA: Diagnosis not present

## 2020-06-05 DIAGNOSIS — G2581 Restless legs syndrome: Secondary | ICD-10-CM | POA: Diagnosis not present

## 2020-06-05 DIAGNOSIS — I130 Hypertensive heart and chronic kidney disease with heart failure and stage 1 through stage 4 chronic kidney disease, or unspecified chronic kidney disease: Secondary | ICD-10-CM | POA: Diagnosis not present

## 2020-06-05 DIAGNOSIS — Z7982 Long term (current) use of aspirin: Secondary | ICD-10-CM | POA: Diagnosis not present

## 2020-06-05 DIAGNOSIS — L97513 Non-pressure chronic ulcer of other part of right foot with necrosis of muscle: Secondary | ICD-10-CM | POA: Diagnosis not present

## 2020-06-05 DIAGNOSIS — I5032 Chronic diastolic (congestive) heart failure: Secondary | ICD-10-CM | POA: Diagnosis not present

## 2020-06-06 ENCOUNTER — Ambulatory Visit: Payer: Medicare Other

## 2020-06-08 ENCOUNTER — Other Ambulatory Visit: Payer: Self-pay

## 2020-06-08 ENCOUNTER — Encounter: Payer: Medicare Other | Attending: Physician Assistant | Admitting: Physician Assistant

## 2020-06-08 ENCOUNTER — Other Ambulatory Visit: Payer: Medicare Other | Admitting: Adult Health Nurse Practitioner

## 2020-06-08 DIAGNOSIS — S62102D Fracture of unspecified carpal bone, left wrist, subsequent encounter for fracture with routine healing: Secondary | ICD-10-CM | POA: Diagnosis not present

## 2020-06-08 DIAGNOSIS — K219 Gastro-esophageal reflux disease without esophagitis: Secondary | ICD-10-CM | POA: Diagnosis not present

## 2020-06-08 DIAGNOSIS — N1831 Chronic kidney disease, stage 3a: Secondary | ICD-10-CM | POA: Diagnosis not present

## 2020-06-08 DIAGNOSIS — M1990 Unspecified osteoarthritis, unspecified site: Secondary | ICD-10-CM | POA: Diagnosis not present

## 2020-06-08 DIAGNOSIS — M5136 Other intervertebral disc degeneration, lumbar region: Secondary | ICD-10-CM | POA: Diagnosis not present

## 2020-06-08 DIAGNOSIS — G9009 Other idiopathic peripheral autonomic neuropathy: Secondary | ICD-10-CM | POA: Diagnosis not present

## 2020-06-08 DIAGNOSIS — I5032 Chronic diastolic (congestive) heart failure: Secondary | ICD-10-CM | POA: Diagnosis not present

## 2020-06-08 DIAGNOSIS — M810 Age-related osteoporosis without current pathological fracture: Secondary | ICD-10-CM | POA: Diagnosis not present

## 2020-06-08 DIAGNOSIS — Z9181 History of falling: Secondary | ICD-10-CM | POA: Diagnosis not present

## 2020-06-08 DIAGNOSIS — G6289 Other specified polyneuropathies: Secondary | ICD-10-CM | POA: Diagnosis not present

## 2020-06-08 DIAGNOSIS — E559 Vitamin D deficiency, unspecified: Secondary | ICD-10-CM | POA: Diagnosis not present

## 2020-06-08 DIAGNOSIS — Z7982 Long term (current) use of aspirin: Secondary | ICD-10-CM | POA: Diagnosis not present

## 2020-06-08 DIAGNOSIS — Z515 Encounter for palliative care: Secondary | ICD-10-CM

## 2020-06-08 DIAGNOSIS — L97513 Non-pressure chronic ulcer of other part of right foot with necrosis of muscle: Secondary | ICD-10-CM | POA: Diagnosis not present

## 2020-06-08 DIAGNOSIS — K5909 Other constipation: Secondary | ICD-10-CM | POA: Diagnosis not present

## 2020-06-08 DIAGNOSIS — G2581 Restless legs syndrome: Secondary | ICD-10-CM | POA: Diagnosis not present

## 2020-06-08 DIAGNOSIS — L97512 Non-pressure chronic ulcer of other part of right foot with fat layer exposed: Secondary | ICD-10-CM | POA: Diagnosis not present

## 2020-06-08 DIAGNOSIS — I480 Paroxysmal atrial fibrillation: Secondary | ICD-10-CM | POA: Insufficient documentation

## 2020-06-08 DIAGNOSIS — I272 Pulmonary hypertension, unspecified: Secondary | ICD-10-CM | POA: Diagnosis not present

## 2020-06-08 DIAGNOSIS — I739 Peripheral vascular disease, unspecified: Secondary | ICD-10-CM | POA: Diagnosis not present

## 2020-06-08 DIAGNOSIS — Z792 Long term (current) use of antibiotics: Secondary | ICD-10-CM | POA: Diagnosis not present

## 2020-06-08 DIAGNOSIS — I130 Hypertensive heart and chronic kidney disease with heart failure and stage 1 through stage 4 chronic kidney disease, or unspecified chronic kidney disease: Secondary | ICD-10-CM | POA: Diagnosis not present

## 2020-06-08 DIAGNOSIS — I4891 Unspecified atrial fibrillation: Secondary | ICD-10-CM | POA: Diagnosis not present

## 2020-06-08 DIAGNOSIS — I2510 Atherosclerotic heart disease of native coronary artery without angina pectoris: Secondary | ICD-10-CM | POA: Diagnosis not present

## 2020-06-08 DIAGNOSIS — E538 Deficiency of other specified B group vitamins: Secondary | ICD-10-CM | POA: Diagnosis not present

## 2020-06-08 DIAGNOSIS — E7849 Other hyperlipidemia: Secondary | ICD-10-CM | POA: Diagnosis not present

## 2020-06-08 DIAGNOSIS — Z7901 Long term (current) use of anticoagulants: Secondary | ICD-10-CM | POA: Insufficient documentation

## 2020-06-08 DIAGNOSIS — I872 Venous insufficiency (chronic) (peripheral): Secondary | ICD-10-CM | POA: Diagnosis not present

## 2020-06-08 DIAGNOSIS — I70235 Atherosclerosis of native arteries of right leg with ulceration of other part of foot: Secondary | ICD-10-CM | POA: Diagnosis not present

## 2020-06-08 NOTE — Progress Notes (Signed)
STEPHANINE, REAS ([id number]) Visit Report for 06/08/2020 Arrival Information Details Patient Name: [name], [name]. Date of Service: 06/08/2020 10:15 AM Medical Record Number: [id number] Patient Account Number: [id number] Date of Birth/Sex: 25-Apr-1929 (87 y.o. F) Treating RN: Dolan Amen Primary Care Lauramae Kneisley: Ria Bush Other Clinician: Jeanine Luz Referring Anatole Apollo: Ria Bush Treating Jamarr Treinen/Extender: Skipper Cliche in Treatment: 34 Visit Information History Since Last Visit Added or deleted any medications: No Patient Arrived: Gilford Rile Had a fall or experienced change in No Arrival Time: 10:30 activities of daily living that may affect Accompanied By: daughter risk of falls: Transfer Assistance: None Hospitalized since last visit: No Patient Identification Verified: Yes Pain Present Now: Yes Secondary Verification Process Completed: Yes Patient Requires Transmission-Based No Precautions: Patient Has Alerts: Yes Patient Alerts: 08/31/19 ABI R)1.3 L) 1.31 08/31/19 TBI R).61 L).58 Electronic Signature(s) Signed: 06/08/2020 1:24:27 PM By: Jeanine Luz Entered By: Jeanine Luz on 06/08/2020 10:31:27 [name] ([id number]) -------------------------------------------------------------------------------- Clinic Level of Care Assessment Details Patient Name: [name] Date of Service: 06/08/2020 10:15 AM Medical Record Number: [id number] Patient Account Number: [id number] Date of Birth/Sex: 1929/04/12 (87 y.o. F) Treating RN: Dolan Amen Primary Care Zamari Vea: Ria Bush Other Clinician: Jeanine Luz Referring Yasseen Salls: Ria Bush Treating Deaun Rocha/Extender: Skipper Cliche in Treatment: 34 Clinic Level of Care Assessment Items TOOL 1 Quantity Score []  - Use when EandM and Procedure is performed on INITIAL visit 0 ASSESSMENTS - Nursing Assessment / Reassessment []  - General Physical Exam (combine w/ comprehensive  assessment (listed just below) when performed on new 0 pt. evals) []  - 0 Comprehensive Assessment (HX, ROS, Risk Assessments, Wounds Hx, etc.) ASSESSMENTS - Wound and Skin Assessment / Reassessment []  - Dermatologic / Skin Assessment (not related to wound area) 0 ASSESSMENTS - Ostomy and/or Continence Assessment and Care []  - Incontinence Assessment and Management 0 []  - 0 Ostomy Care Assessment and Management (repouching, etc.) PROCESS - Coordination of Care []  - Simple Patient / Family Education for ongoing care 0 []  - 0 Complex (extensive) Patient / Family Education for ongoing care []  - 0 Staff obtains Programmer, systems, Records, Test Results / Process Orders []  - 0 Staff telephones HHA, Nursing Homes / Clarify orders / etc []  - 0 Routine Transfer to another Facility (non-emergent condition) []  - 0 Routine Hospital Admission (non-emergent condition) []  - 0 New Admissions / Biomedical engineer / Ordering NPWT, Apligraf, etc. []  - 0 Emergency Hospital Admission (emergent condition) PROCESS - Special Needs []  - Pediatric / Minor Patient Management 0 []  - 0 Isolation Patient Management []  - 0 Hearing / Language / Visual special needs []  - 0 Assessment of Community assistance (transportation, D/C planning, etc.) []  - 0 Additional assistance / Altered mentation []  - 0 Support Surface(s) Assessment (bed, cushion, seat, etc.) INTERVENTIONS - Miscellaneous []  - External ear exam 0 []  - 0 Patient Transfer (multiple staff / Civil Service fast streamer / Similar devices) []  - 0 Simple Staple / Suture removal (25 or less) []  - 0 Complex Staple / Suture removal (26 or more) []  - 0 Hypo/Hyperglycemic Management (do not check if billed separately) []  - 0 Ankle / Brachial Index (ABI) - do not check if billed separately Has the patient been seen at the hospital within the last three years: Yes Total Score: 0 Level Of Care: ____ [name] ([id number]) Electronic Signature(s) Signed:  06/08/2020 2:54:27 PM By: Georges Mouse, Minus Breeding RN Entered By: Georges Mouse, Minus Breeding on 06/08/2020 10:56:41 [name] ([id number]) -------------------------------------------------------------------------------- Encounter Discharge Information Details Patient  Name: [name], [name]. Date of Service: 06/08/2020 10:15 AM Medical Record Number: [id number] Patient Account Number: [id number] Date of Birth/Sex: April 23, 1929 (87 y.o. F) Treating RN: Dolan Amen Primary Care Laurin Paulo: Ria Bush Other Clinician: Jeanine Luz Referring Meleny Tregoning: Ria Bush Treating Lun Muro/Extender: Skipper Cliche in Treatment: 34 Encounter Discharge Information Items Post Procedure Vitals Discharge Condition: Stable Temperature (F): 98.3 Ambulatory Status: Walker Pulse (bpm): 56 Discharge Destination: Home Respiratory Rate (breaths/min): 18 Transportation: Private Auto Blood Pressure (mmHg): 188/71 Accompanied By: daughter Schedule Follow-up Appointment: Yes Clinical Summary of Care: Electronic Signature(s) Signed: 06/08/2020 2:54:27 PM By: Georges Mouse, Minus Breeding RN Entered By: Georges Mouse, Minus Breeding on 06/08/2020 11:01:46 [name] ([id number]) -------------------------------------------------------------------------------- Lower Extremity Assessment Details Patient Name: [name]. Date of Service: 06/08/2020 10:15 AM Medical Record Number: [id number] Patient Account Number: [id number] Date of Birth/Sex: 1929-09-08 (87 y.o. F) Treating RN: Dolan Amen Primary Care Laneka Mcgrory: Ria Bush Other Clinician: Jeanine Luz Referring Elery Cadenhead: Ria Bush Treating Chelcea Zahn/Extender: Jeri Cos Weeks in Treatment: 34 Electronic Signature(s) Signed: 06/08/2020 1:24:27 PM By: Jeanine Luz Signed: 06/08/2020 2:54:27 PM By: Georges Mouse, Minus Breeding RN Entered By: Jeanine Luz on 06/08/2020 10:42:01 [name]  ([id number]) -------------------------------------------------------------------------------- Multi Wound Chart Details Patient Name: [name], [name]. Date of Service: 06/08/2020 10:15 AM Medical Record Number: [id number] Patient Account Number: [id number] Date of Birth/Sex: 06/25/1929 (87 y.o. F) Treating RN: Dolan Amen Primary Care Raynetta Osterloh: Ria Bush Other Clinician: Jeanine Luz Referring Julionna Marczak: Ria Bush Treating Shavelle Runkel/Extender: Skipper Cliche in Treatment: 34 Vital Signs Height(in): 62 Pulse(bpm): 56 Weight(lbs): 140 Blood Pressure(mmHg): 188/71 Body Mass Index(BMI): 26 Temperature(F): 98.3 Respiratory Rate(breaths/min): 18 Photos: [N/A:N/A] Wound Location: Right Metatarsal head fifth N/A N/A Wounding Event: Gradually Appeared N/A N/A Primary Etiology: Arterial Insufficiency Ulcer N/A N/A Comorbid History: Arrhythmia, Osteoarthritis N/A N/A Date Acquired: 04/09/2019 N/A N/A Weeks of Treatment: 34 N/A N/A Wound Status: Open N/A N/A Measurements L x W x D (cm) 0.2x0.2x0.2 N/A N/A Area (cm) : 0.031 N/A N/A Volume (cm) : 0.006 N/A N/A % Reduction in Area: 97.30% N/A N/A % Reduction in Volume: 94.70% N/A N/A Classification: Full Thickness Without Exposed N/A N/A Support Structures Exudate Amount: Medium N/A N/A Exudate Type: Serosanguineous N/A N/A Exudate Color: red, brown N/A N/A Wound Margin: Distinct, outline attached N/A N/A Granulation Amount: Large (67-100%) N/A N/A Granulation Quality: Pale N/A N/A Necrotic Amount: Small (1-33%) N/A N/A Exposed Structures: Fat Layer (Subcutaneous Tissue): N/A N/A Yes Fascia: No Tendon: No Muscle: No Joint: No Bone: No Epithelialization: Small (1-33%) N/A N/A Treatment Notes Electronic Signature(s) Signed: 06/08/2020 2:54:27 PM By: Georges Mouse, Minus Breeding RN Entered By: Georges Mouse, Minus Breeding on 06/08/2020 10:49:43 [name]  ([id number]) -------------------------------------------------------------------------------- [place] Details Patient Name: [name] Date of Service: 06/08/2020 10:15 AM Medical Record Number: [id number] Patient Account Number: [id number] Date of Birth/Sex: [birth year] (87 y.o. F) Treating RN: Dolan Amen Primary Care Brinnley Lacap: Ria Bush Other Clinician: Jeanine Luz Referring Alzora Ha: Ria Bush Treating Shereena Berquist/Extender: Skipper Cliche in Treatment: 70 Active Inactive Abuse / Safety / Falls / Self Care Management Nursing Diagnoses: Potential for falls Goals: Patient/caregiver will verbalize understanding of skin care regimen Date Initiated: 10/12/2019 Target Resolution Date: 05/12/2020 Goal Status: Active Interventions: Assess fall risk on admission and as needed Notes: Electronic Signature(s) Signed: 06/08/2020 2:54:27 PM By: Georges Mouse, Minus Breeding RN Entered By: Georges Mouse, Minus Breeding on 06/08/2020 10:49:05 [name] ([id number]) -------------------------------------------------------------------------------- Pain Assessment Details Patient Name: [name]. Date of Service: 06/08/2020 10:15 AM Medical Record  Number: [id number] Patient Account Number: [id number] Date of Birth/Sex: May 31, 1929 (87 y.o. F) Treating RN: Dolan Amen Primary Care Rachit Grim: Ria Bush Other Clinician: Jeanine Luz Referring Sabreen Kitchen: Ria Bush Treating TRUE Shackleford/Extender: Skipper Cliche in Treatment: 34 Active Problems Location of Pain Severity and Description of Pain Patient Has Paino Yes Site Locations Rate the pain. Current Pain Level: 8 Character of Pain Describe the Pain: Aching, Stabbing, Throbbing Pain Management and Medication Current Pain Management: Electronic Signature(s) Signed: 06/08/2020 1:24:27 PM By: Jeanine Luz Signed: 06/08/2020 2:54:27 PM By: Georges Mouse, Minus Breeding RN Entered By: Jeanine Luz on 06/08/2020 10:33:48 [name] ([id number]) -------------------------------------------------------------------------------- Patient/Caregiver Education Details Patient Name: [name], [name]. Date of Service: 06/08/2020 10:15 AM Medical Record Number: [id number] Patient Account Number: [id number] Date of Birth/Gender: [birth year] (87 y.o. F) Treating RN: Dolan Amen Primary Care Physician: Ria Bush Other Clinician: Jeanine Luz Referring Physician: Ria Bush Treating Physician/Extender: Skipper Cliche in Treatment: 40 Education Assessment Education Provided To: Patient Education Topics Provided Wound/Skin Impairment: Methods: Explain/Verbal Responses: State content correctly Electronic Signature(s) Signed: 06/08/2020 2:54:27 PM By: Georges Mouse, Minus Breeding RN Entered By: Georges Mouse, Minus Breeding on 06/08/2020 10:57:24 [name] ([id number]) -------------------------------------------------------------------------------- Wound Assessment Details Patient Name: [name] Date of Service: 06/08/2020 10:15 AM Medical Record Number: [id number] Patient Account Number: [id number] Date of Birth/Sex: 1930/02/12 (87 y.o. F) Treating RN: Dolan Amen Primary Care Ronda Rajkumar: Ria Bush Other Clinician: Jeanine Luz Referring Mychal Decarlo: Ria Bush Treating Kiril Hippe/Extender: Skipper Cliche in Treatment: 34 Wound Status Wound Number: 3 Primary Etiology: Arterial Insufficiency Ulcer Wound Location: Right Metatarsal head fifth Wound Status: Open Wounding Event: Gradually Appeared Comorbid History: Arrhythmia, Osteoarthritis Date Acquired: 04/09/2019 Weeks Of Treatment: 34 Clustered Wound: No Photos Wound Measurements Length: (cm) 0.2 Width: (cm) 0.2 Depth: (cm) 0.2 Area: (cm) 0.031 Volume: (cm) 0.006 % Reduction in Area: 97.3% % Reduction in Volume: 94.7% Epithelialization: Small (1-33%) Tunneling: No Undermining:  No Wound Description Classification: Full Thickness Without Exposed Support Structures Wound Margin: Distinct, outline attached Exudate Amount: Medium Exudate Type: Serosanguineous Exudate Color: red, brown Foul Odor After Cleansing: No Slough/Fibrino Yes Wound Bed Granulation Amount: Large (67-100%) Exposed Structure Granulation Quality: Pale Fascia Exposed: No Necrotic Amount: Small (1-33%) Fat Layer (Subcutaneous Tissue) Exposed: Yes Necrotic Quality: Adherent Slough Tendon Exposed: No Muscle Exposed: No Joint Exposed: No Bone Exposed: No Treatment Notes Wound #3 (Metatarsal head fifth) Wound Laterality: Right Cleanser Normal Saline Discharge Instruction: Wash your hands with soap and water. Remove old dressing, discard into plastic bag and place into trash. Cleanse the wound with Normal Saline prior to applying a clean dressing using gauze sponges, not tissues or cotton balls. Do not scrub or use excessive force. Pat dry using gauze sponges, not tissue or cotton balls. [name], [name] ([id number]) Peri-Wound Care Topical Primary Dressing Silvercel Small 2x2 (in/in) Discharge Instruction: Apply Silvercel Small 2x2 (in/in) as instructed Secondary Dressing Mepilex Border Flex, 4x4 (in/in) Discharge Instruction: Apply to wound as directed. Do not cut. Secured With Compression Wrap Compression Stockings Add-Ons Electronic Signature(s) Signed: 06/08/2020 1:24:27 PM By: Jeanine Luz Signed: 06/08/2020 2:54:27 PM By: Georges Mouse, Minus Breeding RN Entered By: Jeanine Luz on 06/08/2020 10:40:48 [name] ([id number]) -------------------------------------------------------------------------------- Vitals Details Patient Name: [name] Date of Service: 06/08/2020 10:15 AM Medical Record Number: [id number] Patient Account Number: [id number] Date of Birth/Sex: April 22, 1929 (87 y.o. F) Treating RN: Dolan Amen Primary Care Murphy Duzan: Ria Bush Other  Clinician: Jeanine Luz Referring Rheda Kassab: Ria Bush Treating Clio Gerhart/Extender: Jeri Cos Weeks in Treatment:  34 Vital Signs Time Taken: 10:30 Temperature (F): 98.3 Height (in): 62 Pulse (bpm): 56 Weight (lbs): 140 Respiratory Rate (breaths/min): 18 Body Mass Index (BMI): 25.6 Blood Pressure (mmHg): 188/71 Reference Range: 80 - 120 mg / dl Electronic Signature(s) Signed: 06/08/2020 1:24:27 PM By: Jeanine Luz Entered By: Jeanine Luz on 06/08/2020 10:33:32

## 2020-06-08 NOTE — Progress Notes (Signed)
[facility] Consult Note Telephone: [phone]  Fax: [phone]  PATIENT NAME: [name] DOB: January 15, 1930 MRN: [id number]  PRIMARY CARE PROVIDER:   Ria Bush, MD  REFERRING PROVIDER:  Ria Bush, MD [address] [place],  [place] [address]  RESPONSIBLE PARTY:  [name], daughter [phone]  Chief complaint:  Follow up palliative visit/neuropathy  Step daughter present during visit today   RECOMMENDATIONS and PLAN: 1.Advanced care planning. Patient is DNR  2.  Neuropathy.  This is adequately managed with present dose of gabapentin and with using her tramadol in the morning and at night.  Supplements with muscle rubs.  Continue current pain regimen.  3.  Constipation.  This is chronic for the patient.  Have encouraged adding a daily stool softener with her MiraLAX daily.  Have encouraged trying milk of magnesia as needed.  4.  CHF.  This is stable at this time.  Continue follow-up and recommendations by cardiology  5.  Support.  Patient has been recertified to continue home health services for now.  Stepdaughter is still trying to find someone who can help in the home once this ends.  Palliative will continue to monitor for symptom management/decline and make recommendations as needed.  Follow-up visit in 8 weeks.  Encouraged to call with any questions or concerns.  I spent 60 minutes providing this consultation, including time spent with patient/family, provider coordination, chart review, documentation. More than 50% of the time in this consultation was spent coordinating communication.   HISTORY OF PRESENT ILLNESS:  [name] is a 87 y.o. year old female with multiple medical problems including mild dementia, CHF, HTN, PVD with chronic ulcer of right foot, peripheral neuropathy, GERD, HLD, A. fib, RLS, CAD, CKD D stage III. Palliative Care was asked to help address goals of care.   Patient is getting better pain control with tramadol in the morning and at night.  Has been having more achiness in the evening related to her restless leg syndrome.  States that she does get relief with icy hot and other muscle rubs.  Patient continues to be seen at wound clinic for wound to right lateral foot which is improving.  Does have home health RN that comes for wound management along with an aide that helps her with personal care.  Patient is having problems with constipation.  States that it has been 7 days since her last bowel movement other than small strands every now and then.  She takes MiraLAX daily.  Has tried an enema and voices trying another one.  States that she has been more active and is drinking plenty of water.  Patient has not had any falls, infection, hospital visits since last visit.  Appetite is good with weight stable in the 120s.  Rest of 10 point ROS asked and negative.  CODE STATUS: DNR  PPS: 60% HOSPICE ELIGIBILITY/DIAGNOSIS: TBD  PHYSICAL EXAM:  BP 142/78 HR 67 O2 98% on room air General: NAD, frail appearing Eyes: Sclera anicteric and noninjected with no discharge noted Cardiovascular: regular rate and rhythm Pulmonary: Lung sounds clear; normal respiratory effort Abdomen: soft, nontender, + bowel sounds Extremities: no edema, no joint deformities Skin: no rashes on exposed skin.  Has wound to lateral side of right foot being managed by wound clinic Neurological: Weakness but otherwise nonfocal; has forgetfulness   PAST MEDICAL HISTORY:  Past Medical History:  Diagnosis Date  . Abdominal aortic atherosclerosis ([place]) 09/2015   by  xray  . Anxiety   . BCC (basal cell carcinoma of skin) 05/2011   on nose  . Breast lesion    a. diagnosed with complex sclerosing lesion with calcifications of the right breast in 08/2016  . Chest pain    a. nuclear stress test 03/2012: normal; b. 09/2018 MV: EF >65%, no isch/scar.   . DDD (degenerative disc disease),  lumbosacral 12/06/1992  . Gallstones 09/2015   incidentally by xray  . GERD (gastroesophageal reflux disease) 05/1999  . HERPES ZOSTER 04/13/2007   Qualifier: Diagnosis of  By: [name], [name]   . Hiatal hernia   . History of shingles    ophthalmic, takes acyclovir daily  . Hyperlipidemia 12/2003  . Hypertension 1990  . Mitral regurgitation    a. echo 03/2012: EF 60-65%, no RWMA, mild to mod AI/MR, mod TR, PASP 37 mmHg  . Osteoarthritis 08/21/1989  . Osteoporosis with fracture 11/1997   with compression fracture T12  . Persistent atrial fibrillation ([place]) 03/16/2012   a. CHADS2VASc => 5 (HTN, age x 2, vascular disease, sex category)-->Eliquis 5 BID;  b. Recurrent AF in 06/2016;  c. 01/2017 s/p DCCV;  d. 02/2017 recurrent AFib->Amio added->DCCV; e. 03/2017 recurrent AFib, s/p reload of amio and DCCV 04/02/2017  . POSTHERPETIC NEURALGIA 04/20/2007   Qualifier: Diagnosis of  By: [name], [name]   . Rheumatoid arthritis ([place])   . RLS (restless legs syndrome)     SOCIAL HX:  Social History   Tobacco Use  . Smoking status: Former Smoker    Packs/day: 0.25    Years: 1.00    Pack years: 0.25    Quit date: 04/08/1976    Years since quitting: 44.1  . Smokeless tobacco: Never Used  . Tobacco comment: pt smokes occasionally "socially"  Substance Use Topics  . Alcohol use: No    Alcohol/week: 1.0 standard drink    Types: 1 Glasses of wine per week    ALLERGIES:  Allergies  Allergen Reactions  . Penicillins Rash and Other (See Comments)    Childhood Allergy Has patient had a PCN reaction causing immediate rash, facial/tongue/throat swelling, SOB or lightheadedness with hypotension: Yes Has patient had a PCN reaction causing severe rash involving mucus membranes or skin necrosis: Unknown Has patient had a PCN reaction that required hospitalization: No Has patient had a PCN reaction occurring within the last 10 years: No If all of the above answers are "NO",  then may proceed with Cephalosporin use.      PERTINENT MEDICATIONS:  Outpatient Encounter Medications as of 06/08/2020  Medication Sig  . acetaminophen (TYLENOL) 500 MG tablet Take 1,000 mg every 8 (eight) hours as needed by mouth for moderate pain.   Marland Kitchen acyclovir (ZOVIRAX) 400 MG tablet Take 400 mg by mouth 2 (two) times daily.  Marland Kitchen amiodarone (PACERONE) 100 MG tablet Take 1 tablet (100 mg total) by mouth daily.  Marland Kitchen aspirin EC 81 MG tablet Take 1 tablet (81 mg total) by mouth daily.  Marland Kitchen atorvastatin (LIPITOR) 20 MG tablet TAKE 1 TABLET BY MOUTH ONCE DAILY  . DULoxetine (CYMBALTA) 60 MG capsule TAKE 1 CAPSULE BY MOUTH ONCE DAILY  . ELIQUIS 2.5 MG TABS tablet TAKE 1 TABLET BY MOUTH TWICE DAILY  . gabapentin (NEURONTIN) 300 MG capsule Take 1 capsule (300 mg total) by mouth at bedtime. With second dose as needed  . lisinopril (ZESTRIL) 10 MG tablet Take 1 tablet (10 mg total) by mouth daily.  . memantine (NAMENDA) 5 MG  tablet TAKE 1 TABLET BY MOUTH TWICE DAILY  . metoprolol tartrate (LOPRESSOR) 25 MG tablet TAKE 1/2 TABLET BY MOUTH TWICE DAILY  . neomycin-polymyxin-hydrocortisone (CORTISPORIN) 3.5-10000-1 OTIC suspension SMARTSIG:In Ear(s)  . pantoprazole (PROTONIX) 40 MG tablet TAKE 1 TABLET BY MOUTH EVERY DAY  . polyethylene glycol (MIRALAX / GLYCOLAX) 17 g packet Take 17 g by mouth daily as needed for mild constipation.  . prednisoLONE acetate (PRED FORTE) 1 % ophthalmic suspension Place 1 drop into the left eye daily.   Marland Kitchen rOPINIRole (REQUIP) 1 MG tablet Take 2.5 tablets (2.5 mg total) by mouth at bedtime.  . senna (SENOKOT) 8.6 MG TABS tablet Take 1 tablet (8.6 mg total) by mouth daily as needed for mild constipation.  . traMADol (ULTRAM) 50 MG tablet Take 50 mg by mouth daily. Takes 2 tablets daily  . Vitamin D, Ergocalciferol, (DRISDOL) 1.25 MG (50000 UNIT) CAPS capsule TAKE 1 CAPSULE BY MOUTH ONCE WEEKLY   No facility-administered encounter medications on file as of 06/08/2020.     Selena Swaminathan Jenetta Downer, NP

## 2020-06-08 NOTE — Progress Notes (Addendum)
GILA, LAUF ([id number]) Visit Report for 06/08/2020 Chief Complaint Document Details Patient Name: [name], [name]. Date of Service: 06/08/2020 10:15 AM Medical Record Number: [id number] Patient Account Number: [id number] Date of Birth/Sex: 1929/11/25 (87 y.o. F) Treating RN: Dolan Amen Primary Care Provider: Ria Bush Other Clinician: Jeanine Luz Referring Provider: Ria Bush Treating Provider/Extender: Skipper Cliche in Treatment: 34 Information Obtained from: Patient Chief Complaint Right Lateral Foot Ulcer Electronic Signature(s) Signed: 06/08/2020 10:35:05 AM By: Worthy Keeler PA-C Entered By: Worthy Keeler on 06/08/2020 10:35:04 [name] ([id number]) -------------------------------------------------------------------------------- Debridement Details Patient Name: [name] Date of Service: 06/08/2020 10:15 AM Medical Record Number: [id number] Patient Account Number: [id number] Date of Birth/Sex: 06/16/1929 (87 y.o. F) Treating RN: Dolan Amen Primary Care Provider: Ria Bush Other Clinician: Jeanine Luz Referring Provider: Ria Bush Treating Provider/Extender: Skipper Cliche in Treatment: 34 Debridement Performed for Wound #3 Right Metatarsal head fifth Assessment: Performed By: Physician Tommie Sams., PA-C Debridement Type: Debridement Severity of Tissue Pre Debridement: Fat layer exposed Level of Consciousness (Pre- Awake and Alert procedure): Pre-procedure Verification/Time Out Yes - 10:49 Taken: Start Time: 10:49 Pain Control: Lidocaine 4% Topical Solution Total Area Debrided (L x W): 2 (cm) x 2 (cm) = 4 (cm) Tissue and other material Non-Viable, Callus, Skin: Epidermis debrided: Level: Skin/Epidermis Debridement Description: Selective/Open Wound Instrument: Curette Specimen: Swab, Number of Specimens Taken: 1 Bleeding: Minimum Hemostasis Achieved: Pressure Response to Treatment:  Procedure was tolerated well Level of Consciousness (Post- Awake and Alert procedure): Post Debridement Measurements of Total Wound Length: (cm) 1.2 Width: (cm) 1 Depth: (cm) 0.2 Volume: (cm) 0.188 Character of Wound/Ulcer Post Debridement: Stable Severity of Tissue Post Debridement: Fat layer exposed Post Procedure Diagnosis Same as Pre-procedure Electronic Signature(s) Signed: 06/08/2020 2:54:27 PM By: Georges Mouse, Minus Breeding RN Signed: 06/09/2020 11:30:15 AM By: Worthy Keeler PA-C Entered By: Georges Mouse, Minus Breeding on 06/08/2020 10:55:22 [name] ([id number]) -------------------------------------------------------------------------------- HPI Details Patient Name: [name]. Date of Service: 06/08/2020 10:15 AM Medical Record Number: [id number] Patient Account Number: [id number] Date of Birth/Sex: 10-Mar-1930 (87 y.o. F) Treating RN: Dolan Amen Primary Care Provider: Ria Bush Other Clinician: Jeanine Luz Referring Provider: Ria Bush Treating Provider/Extender: Skipper Cliche in Treatment: 34 History of Present Illness HPI Description: 10/12/2019 upon evaluation today patient appears to be doing somewhat poorly in regard to her wound at this point. She has a ulcer over the right fifth metatarsal region. This has been managed by podiatry up to this point. Unfortunately the patient just does not seem to be making the progress that they were hoping for an subsequently the patient was referred to [place] by [facility] Dr. Posey Pronto. With that being said I did note that the patient does have peripheral vascular disease which has been managed by Dr. Lazaro Arms her most recent angiogram with intervention was on 08/02/2019. The ABIs following were on the right 1.30 with a TBI of 0.61 normal left 1.31 with a TBI of 0.58. Obviously things seem to be doing somewhat better at this point hopefully enough to allow her to heal. She is on anticoagulant therapy  due to atrial fibrillation long-term. With that being said the patient is unfortunately having quite a bit of discomfort at this time secondary to the wound. She also has erythema which is very likely secondary to infection. I am going obtain a culture of the area today once debridement is complete. Apparently the patient also had a x-ray that was performed  by Dr. Posey Pronto on 09/21/2019 and this showed that the patient had no obvious signs on x-ray of cortical irregularity and no osteomyelitis obviously noted. With that being said as I discussed with the patient's daughter as well this does not indicate that the patient absolutely has no osteomyelitis as x-rays are only at best 50% helpful in identifying this complication. Nonetheless I do believe that we need to have the patient continue to have this further evaluated specifically I am to recommend that we check into an MRI which I think would be ideal to evaluate whether or not there is osteomyelitis present. 7/16; this is a patient with an ulcer over the right fifth metatarsal head. She follows with Dr. [facility]boy [facility]. She is due to have an MRI of the foot on 7/26. Using Santyl to the wound 11/04/2019 on evaluation today patient appears to be doing okay in regard to her wound at this time. Unfortunately since I last saw her which was actually her second visit here in the clinic she has moved into a new apartment and then subsequently had a fall. She ended up in the hospital she did fracture her left arm and subsequently has also been dealing with a lot of dizziness she has been at [employer]. She tells me that getting in the change the dressing has been somewhat difficult as far as daily dressing changes are concerned with the Santyl. Nonetheless when they do change it that is what is been used and her daughter-in-law has also been coming in to help out as well with the dressing changes to try to make sure it gets done daily.  Fortunately there is no signs of active infection at this time. No fever chills noted. I do believe the Santyl has been beneficial and is likely still the best way to go at this point for the patient. I did review the patient's MRI which showed some potential for reactive osteitis but did not show any signs of osteomyelitis overtly. That cannot be excluded but again right now it does not appear that is very likely present as well. This is good news. I also did review her culture she has been on the Bactrim she tells me she has taken this and again that was appropriate based on the results of her culture. 11/19/2019 upon evaluation today patient appears to be doing decently well in regard to her wound. Fortunately there is no signs of active infection at this time wound is not significantly larger in fact is roughly about the same at this point. With that being said there is not as much erythema as previously noted I think the infection seems to be under much better control which is great news. Unfortunately she has fallen since I last saw her and broken her left arm she has an appointment with them following the appointment with me. 8/25; patient was worked in acutely today at the request of home health and was concerned about her right heel, generalized erythema in her feet bilaterally. According to her daughter things have been getting worse over the last for 5 days but not precisely from a wound care point of view. Her wound is on the lateral part of the right fifth metatarsal head looks about the same as I remember this. She has developed dryness and flaking of the skin on her plantar feet. As well a very tender area localized over the insertion point of the Achilles tendon. She has restless leg syndrome. Does not  specifically offload the area at night. She wears socks at home and does not really give me a pressure on the right heel issue. I reviewed her vascular status. She underwent her last  revascularization on 08/02/2019 by Dr. Lucky Cowboy. This included an angioplasty of the right peroneal artery as well as the right posterior tibial artery. Her last noninvasive arterial study was on 11/23/2019. On the right her ABI was 1.08 TBI of 0.48. On the left ABI at 1.21 with a TBI of 0.54. Family relates that Dr. Lucky Cowboy felt that he had done the best he could and this. We have been using Santyl to the one wound she has on the right lateral fifth metatarsal head. She does not have a new wound today which is the concern that letter coming into the clinic 12/09/2019 on evaluation today patient appears to be doing decently well in regard to her foot ulcer at this point. She does note that with the compression wrap that the home health nurse put on after contacting our clinic that she actually did much better and felt much better. Her heel is also improved. Fortunately there is no signs of active infection at this time. No fevers, chills, nausea, vomiting, or diarrhea. 12/24/2019 upon evaluation today patient appears to be doing somewhat poorly still in regard to her foot. Unfortunately this is just not showing the signs of improvement that I would like to see it is a little bit cleaner but also appears to be erythematous and I believe there is infection noted at this point. We will get a need to address this in my opinion. I did actually recommend that we switch back to the Iodoflex as well apparently the home health nurse had switch this away that not really what we want to do at this point in my opinion. They were using silver alginate I think most recently. 01/06/2020 upon evaluation today patient actually appears to be doing much better in regard to her wound. We have switched back to the silver alginate after her home health nurse contacted me and honestly I feel like she is doing quite well in that regard. She does have a lot of drainage and therefore I think that is probably a better option for. With that  being said she is tolerating the compression wrap without complication that also seems to be helping. 01/28/2020 on evaluation today patient appears to be doing well with regard to her foot ulcer. I do feel like she is showing signs of improvement which is great news. There is no signs of active infection at this time which is also good news. No fevers, chills, nausea, vomiting, or diarrhea. Unfortunately she did have a fall and sustained an injury to the left upper arm. She has a skin tear here this appears to be doing okay although MILIA, WARTH. ([id number]) the dressing was stuck to this this was an alginate dressing. I really think she probably needs something like Xeroform to try to prevent anything from sticking to this region. Other than that she seems to be doing quite well. 02/11/2020 on evaluation today patient's arm skin tear appears to be completely healed which is great. Unfortunately she is still having issues at this time with her foot although this seems to be showing signs of improvement it still very slow. There is no signs of active infection at this time. 03/06/2020 upon evaluation today patient appears to be doing decently well in regard to her foot ulcer. There is some minimal slough  noted on the surface of the wound building up at this point. Otherwise I really feel like she is doing quite nicely with quite a bit of new granulation and epithelial growth. 03/23/2020 upon evaluation today patient appears to be doing well with regards to her wound. She has been tolerating the dressing changes without complication. Fortunately there is no evidence of active infection and overall feel like her foot ulcer is doing excellent at this point. 04/13/2020 on evaluation today patient appears to be doing well with regard to her foot ulcer. She has been tolerating the dressing changes without complication. Fortunately there is no signs of active infection at this time. Overall I feel like the  wound is getting close to complete closure and this appears to be doing quite well in my opinion. 04/27/2020 on evaluation today patient appears to be doing excellent in regard to her wound. She has been tolerating the dressing changes without complication. Fortunately there is no signs of active infection and overall very pleased with where things stand today. In fact I feel like the wound is very close to complete resolution. 05/18/2020 upon evaluation today patient appears to be doing well with regard to her foot ulcer. There is no evidence of active infection at this time which is great news and overall very pleased with where things stand. No fevers, chills, nausea, vomiting, or diarrhea. I do feel like she is making great progress and I feel like that the wound is very tiny but again this last portion getting close has been somewhat difficult. 07/05/2020 upon evaluation today patient appears to be doing a little poorly in regard to her foot ulcer. Fortunately there is no signs of active infection at this time systemically though locally I feel it may be some cellulitis it looks like this wound may have prematurely sealed up subsequently causing a blister that had to be removed. The good news is that I do not think it is a very bad infection but I believe she may be experiencing cellulitis. Electronic Signature(s) Signed: 06/08/2020 10:59:17 AM By: Worthy Keeler PA-C Entered By: Worthy Keeler on 06/08/2020 10:59:16 [name] ([id number]) -------------------------------------------------------------------------------- Physical Exam Details Patient Name: [name], Capano. Date of Service: 06/08/2020 10:15 AM Medical Record Number: [id number] Patient Account Number: [id number] Date of Birth/Sex: November 17, 1929 (87 y.o. F) Treating RN: Dolan Amen Primary Care Provider: Ria Bush Other Clinician: Jeanine Luz Referring Provider: Ria Bush Treating Provider/Extender:  Skipper Cliche in Treatment: 20 Constitutional Well-nourished and well-hydrated in no acute distress. Respiratory normal breathing without difficulty. Psychiatric this patient is able to make decisions and demonstrates good insight into disease process. Alert and Oriented x 3. pleasant and cooperative. Notes Patient's wound did require debridement to remove the blister tissue I was able to do this and there was some purulent drainage noted as well. We did culture from the base of the wound post debridement and this appears to be doing better although I do think that an antibiotic would be appropriate depending on the culture results we may have to alter or change that a bit following. Electronic Signature(s) Signed: 06/08/2020 11:00:19 AM By: Worthy Keeler PA-C Entered By: Worthy Keeler on 06/08/2020 11:00:19 [name], [name] ([id number]) -------------------------------------------------------------------------------- Physician Orders Details Patient Name: [name] Date of Service: 06/08/2020 10:15 AM Medical Record Number: [id number] Patient Account Number: [id number] Date of Birth/Sex: January 30, 1930 (87 y.o. F) Treating RN: Dolan Amen Primary Care Provider: Ria Bush Other Clinician: Jeanine Luz Referring Provider:  Ria Bush Treating Provider/Extender: Jeri Cos Weeks in Treatment: 73 Verbal / Phone Orders: No Diagnosis Coding ICD-10 Coding Code Description I73.89 Other specified peripheral vascular diseases L97.512 Non-pressure chronic ulcer of other part of right foot with fat layer exposed S41.102A Unspecified open wound of left upper arm, initial encounter Z79.01 Long term (current) use of anticoagulants I48.0 Paroxysmal atrial fibrillation B35.3 Tinea pedis Follow-up Appointments Wound #3 Right Metatarsal head fifth o Return Appointment in 1 week. Home Health Wound #3 Right Metatarsal head fifth o [place]: -  [facility] for wound care. May utilize formulary equivalent dressing for wound treatment orders unless otherwise specified. Home Health Nurse may visit PRN to address patientos wound care needs. o Scheduled days for dressing changes to be completed; exception, patient has scheduled wound care visit that day. o **Please direct any NON-WOUND related issues/requests for orders to patient's Primary Care Physician. **If current dressing causes regression in wound condition, may D/C ordered dressing product/s and apply Normal Saline Moist Dressing daily until next Between or Other MD appointment. **Notify [facility] of regression in wound condition at [phone]. Medications-Please add to medication list. Wound #3 Right Metatarsal head fifth o P.O. Antibiotics - Bactrim Wound Treatment Wound #3 - Metatarsal head fifth Wound Laterality: Right Cleanser: Normal Saline (Generic) 2 x Per Week/30 Days Discharge Instructions: Wash your hands with soap and water. Remove old dressing, discard into plastic bag and place into trash. Cleanse the wound with Normal Saline prior to applying a clean dressing using gauze sponges, not tissues or cotton balls. Do not scrub or use excessive force. Pat dry using gauze sponges, not tissue or cotton balls. Primary Dressing: Silvercel Small 2x2 (in/in) 2 x Per Week/30 Days Discharge Instructions: Apply Silvercel Small 2x2 (in/in) as instructed Secondary Dressing: Mepilex Border Flex, 4x4 (in/in) (Generic) 2 x Per Week/30 Days Discharge Instructions: Apply to wound as directed. Do not cut. Laboratory o Bacteria identified in Wound by Culture (MICRO) - Right met head fifth oooo LOINC Code: 5852-7 oooo Convenience Name: Wound culture routine Patient Medications [name], [name] ([id number]) Allergies: penicillin Notifications Medication Indication Start End Bactrim DS 06/08/2020 DOSE 1 - oral 800 mg-160 mg tablet -  1 tablet oral taken 2 times per day for 14 days. Do not take potassium while taking this medication Electronic Signature(s) Signed: 06/08/2020 11:03:41 AM By: Worthy Keeler PA-C Entered By: Worthy Keeler on 06/08/2020 11:03:41 [name], [name] ([id number]) -------------------------------------------------------------------------------- Problem List Details Patient Name: [name] Date of Service: 06/08/2020 10:15 AM Medical Record Number: [id number] Patient Account Number: [id number] Date of Birth/Sex: 08-28-1929 (87 y.o. F) Treating RN: Dolan Amen Primary Care Provider: Ria Bush Other Clinician: Jeanine Luz Referring Provider: Ria Bush Treating Provider/Extender: Skipper Cliche in Treatment: 34 Active Problems ICD-10 Encounter Code Description Active Date MDM Diagnosis I73.89 Other specified peripheral vascular diseases 10/12/2019 No Yes L97.512 Non-pressure chronic ulcer of other part of right foot with fat layer 10/12/2019 No Yes exposed S41.102A Unspecified open wound of left upper arm, initial encounter 01/28/2020 No Yes Z79.01 Long term (current) use of anticoagulants 10/12/2019 No Yes I48.0 Paroxysmal atrial fibrillation 10/12/2019 No Yes B35.3 Tinea pedis 12/01/2019 No Yes Inactive Problems Resolved Problems Electronic Signature(s) Signed: 06/08/2020 10:34:52 AM By: Worthy Keeler PA-C Entered By: Worthy Keeler on 06/08/2020 10:34:52 [name] ([id number]) -------------------------------------------------------------------------------- Progress Note Details Patient Name: [name]. Date of Service: 06/08/2020 10:15 AM Medical Record Number: [id number] Patient Account  Number: [id number] Date of Birth/Sex: May 04, 1929 (87 y.o. F) Treating RN: Dolan Amen Primary Care Provider: Ria Bush Other Clinician: Jeanine Luz Referring Provider: Ria Bush Treating Provider/Extender: Skipper Cliche in Treatment:  34 Subjective Chief Complaint Information obtained from Patient Right Lateral Foot Ulcer History of Present Illness (HPI) 10/12/2019 upon evaluation today patient appears to be doing somewhat poorly in regard to her wound at this point. She has a ulcer over the right fifth metatarsal region. This has been managed by podiatry up to this point. Unfortunately the patient just does not seem to be making the progress that they were hoping for an subsequently the patient was referred to [place] by [facility] Dr. Posey Pronto. With that being said I did note that the patient does have peripheral vascular disease which has been managed by Dr. Lazaro Arms her most recent angiogram with intervention was on 08/02/2019. The ABIs following were on the right 1.30 with a TBI of 0.61 normal left 1.31 with a TBI of 0.58. Obviously things seem to be doing somewhat better at this point hopefully enough to allow her to heal. She is on anticoagulant therapy due to atrial fibrillation long-term. With that being said the patient is unfortunately having quite a bit of discomfort at this time secondary to the wound. She also has erythema which is very likely secondary to infection. I am going obtain a culture of the area today once debridement is complete. Apparently the patient also had a x-ray that was performed by Dr. Posey Pronto on 09/21/2019 and this showed that the patient had no obvious signs on x-ray of cortical irregularity and no osteomyelitis obviously noted. With that being said as I discussed with the patient's daughter as well this does not indicate that the patient absolutely has no osteomyelitis as x-rays are only at best 50% helpful in identifying this complication. Nonetheless I do believe that we need to have the patient continue to have this further evaluated specifically I am to recommend that we check into an MRI which I think would be ideal to evaluate whether or not there is osteomyelitis present. 7/16; this is  a patient with an ulcer over the right fifth metatarsal head. She follows with Dr. Lucky Cowboy at [facility]. She is due to have an MRI of the foot on 7/26. Using Santyl to the wound 11/04/2019 on evaluation today patient appears to be doing okay in regard to her wound at this time. Unfortunately since I last saw her which was actually her second visit here in the clinic she has moved into a new apartment and then subsequently had a fall. She ended up in the hospital she did fracture her left arm and subsequently has also been dealing with a lot of dizziness she has been at [employer]. She tells me that getting in the change the dressing has been somewhat difficult as far as daily dressing changes are concerned with the Santyl. Nonetheless when they do change it that is what is been used and her daughter-in-law has also been coming in to help out as well with the dressing changes to try to make sure it gets done daily. Fortunately there is no signs of active infection at this time. No fever chills noted. I do believe the Santyl has been beneficial and is likely still the best way to go at this point for the patient. I did review the patient's MRI which showed some potential for reactive osteitis but did not show any signs  of osteomyelitis overtly. That cannot be excluded but again right now it does not appear that is very likely present as well. This is good news. I also did review her culture she has been on the Bactrim she tells me she has taken this and again that was appropriate based on the results of her culture. 11/19/2019 upon evaluation today patient appears to be doing decently well in regard to her wound. Fortunately there is no signs of active infection at this time wound is not significantly larger in fact is roughly about the same at this point. With that being said there is not as much erythema as previously noted I think the infection seems to be under much better control which is  great news. Unfortunately she has fallen since I last saw her and broken her left arm she has an appointment with them following the appointment with me. 8/25; patient was worked in acutely today at the request of home health and was concerned about her right heel, generalized erythema in her feet bilaterally. According to her daughter things have been getting worse over the last for 5 days but not precisely from a wound care point of view. Her wound is on the lateral part of the right fifth metatarsal head looks about the same as I remember this. She has developed dryness and flaking of the skin on her plantar feet. As well a very tender area localized over the insertion point of the Achilles tendon. She has restless leg syndrome. Does not specifically offload the area at night. She wears socks at home and does not really give me a pressure on the right heel issue. I reviewed her vascular status. She underwent her last revascularization on 08/02/2019 by Dr. Lucky Cowboy. This included an angioplasty of the right peroneal artery as well as the right posterior tibial artery. Her last noninvasive arterial study was on 11/23/2019. On the right her ABI was 1.08 TBI of 0.48. On the left ABI at 1.21 with a TBI of 0.54. Family relates that Dr. Lucky Cowboy felt that he had done the best he could and this. We have been using Santyl to the one wound she has on the right lateral fifth metatarsal head. She does not have a new wound today which is the concern that letter coming into the clinic 12/09/2019 on evaluation today patient appears to be doing decently well in regard to her foot ulcer at this point. She does note that with the compression wrap that the home health nurse put on after contacting our clinic that she actually did much better and felt much better. Her heel is also improved. Fortunately there is no signs of active infection at this time. No fevers, chills, nausea, vomiting, or diarrhea. 12/24/2019 upon evaluation  today patient appears to be doing somewhat poorly still in regard to her foot. Unfortunately this is just not showing the signs of improvement that I would like to see it is a little bit cleaner but also appears to be erythematous and I believe there is infection noted at this point. We will get a need to address this in my opinion. I did actually recommend that we switch back to the Iodoflex as well apparently the home health nurse had switch this away that not really what we want to do at this point in my opinion. They were using silver alginate I think most recently. 01/06/2020 upon evaluation today patient actually appears to be doing much better in regard to her wound. We have  switched back to the silver alginate after her home health nurse contacted me and honestly I feel like she is doing quite well in that regard. She does have a lot of drainage and therefore I think that is probably a better option for. With that being said she is tolerating the compression wrap without complication that [name], [name]. ([id number]) also seems to be helping. 01/28/2020 on evaluation today patient appears to be doing well with regard to her foot ulcer. I do feel like she is showing signs of improvement which is great news. There is no signs of active infection at this time which is also good news. No fevers, chills, nausea, vomiting, or diarrhea. Unfortunately she did have a fall and sustained an injury to the left upper arm. She has a skin tear here this appears to be doing okay although the dressing was stuck to this this was an alginate dressing. I really think she probably needs something like Xeroform to try to prevent anything from sticking to this region. Other than that she seems to be doing quite well. 02/11/2020 on evaluation today patient's arm skin tear appears to be completely healed which is great. Unfortunately she is still having issues at this time with her foot although this seems to be  showing signs of improvement it still very slow. There is no signs of active infection at this time. 03/06/2020 upon evaluation today patient appears to be doing decently well in regard to her foot ulcer. There is some minimal slough noted on the surface of the wound building up at this point. Otherwise I really feel like she is doing quite nicely with quite a bit of new granulation and epithelial growth. 03/23/2020 upon evaluation today patient appears to be doing well with regards to her wound. She has been tolerating the dressing changes without complication. Fortunately there is no evidence of active infection and overall feel like her foot ulcer is doing excellent at this point. 04/13/2020 on evaluation today patient appears to be doing well with regard to her foot ulcer. She has been tolerating the dressing changes without complication. Fortunately there is no signs of active infection at this time. Overall I feel like the wound is getting close to complete closure and this appears to be doing quite well in my opinion. 04/27/2020 on evaluation today patient appears to be doing excellent in regard to her wound. She has been tolerating the dressing changes without complication. Fortunately there is no signs of active infection and overall very pleased with where things stand today. In fact I feel like the wound is very close to complete resolution. 05/18/2020 upon evaluation today patient appears to be doing well with regard to her foot ulcer. There is no evidence of active infection at this time which is great news and overall very pleased with where things stand. No fevers, chills, nausea, vomiting, or diarrhea. I do feel like she is making great progress and I feel like that the wound is very tiny but again this last portion getting close has been somewhat difficult. 07/05/2020 upon evaluation today patient appears to be doing a little poorly in regard to her foot ulcer. Fortunately there is no  signs of active infection at this time systemically though locally I feel it may be some cellulitis it looks like this wound may have prematurely sealed up subsequently causing a blister that had to be removed. The good news is that I do not think it is a very bad infection but  I believe she may be experiencing cellulitis. Objective Constitutional Well-nourished and well-hydrated in no acute distress. Vitals Time Taken: 10:30 AM, Height: 62 in, Weight: 140 lbs, BMI: 25.6, Temperature: 98.3 F, Pulse: 56 bpm, Respiratory Rate: 18 breaths/min, Blood Pressure: 188/71 mmHg. Respiratory normal breathing without difficulty. Psychiatric this patient is able to make decisions and demonstrates good insight into disease process. Alert and Oriented x 3. pleasant and cooperative. General Notes: Patient's wound did require debridement to remove the blister tissue I was able to do this and there was some purulent drainage noted as well. We did culture from the base of the wound post debridement and this appears to be doing better although I do think that an antibiotic would be appropriate depending on the culture results we may have to alter or change that a bit following. Integumentary (Hair, Skin) Wound #3 status is Open. Original cause of wound was Gradually Appeared. The date acquired was: 04/09/2019. The wound has been in treatment 34 weeks. The wound is located on the Right Metatarsal head fifth. The wound measures 0.2cm length x 0.2cm width x 0.2cm depth; 0.031cm^2 area and 0.006cm^3 volume. There is Fat Layer (Subcutaneous Tissue) exposed. There is no tunneling or undermining noted. There is a medium amount of serosanguineous drainage noted. The wound margin is distinct with the outline attached to the wound base. There is large (67-100%) pale granulation within the wound bed. There is a small (1-33%) amount of necrotic tissue within the wound bed including Adherent Slough. Assessment Active  Problems [name], [name] ([id number]) ICD-10 Other specified peripheral vascular diseases Non-pressure chronic ulcer of other part of right foot with fat layer exposed Unspecified open wound of left upper arm, initial encounter Long term (current) use of anticoagulants Paroxysmal atrial fibrillation Tinea pedis Procedures Wound #3 Pre-procedure diagnosis of Wound #3 is an Arterial Insufficiency Ulcer located on the Right Metatarsal head fifth .Severity of Tissue Pre Debridement is: Fat layer exposed. There was a Selective/Open Wound Skin/Epidermis Debridement with a total area of 4 sq cm performed by Tommie Sams., PA-C. With the following instrument(s): Curette to remove Non-Viable tissue/material. Material removed includes Callus and Skin: Epidermis and after achieving pain control using Lidocaine 4% Topical Solution. 1 specimen was taken by a Swab and sent to the lab per facility protocol. A time out was conducted at 10:49, prior to the start of the procedure. A Minimum amount of bleeding was controlled with Pressure. The procedure was tolerated well. Post Debridement Measurements: 1.2cm length x 1cm width x 0.2cm depth; 0.188cm^3 volume. Character of Wound/Ulcer Post Debridement is stable. Severity of Tissue Post Debridement is: Fat layer exposed. Post procedure Diagnosis Wound #3: Same as Pre-Procedure Plan Follow-up Appointments: Wound #3 Right Metatarsal head fifth: Return Appointment in 1 week. Home Health: Wound #3 Right Metatarsal head fifth: [place]: - [facility] for wound care. May utilize formulary equivalent dressing for wound treatment orders unless otherwise specified. Home Health Nurse may visit PRN to address patient s wound care needs. Scheduled days for dressing changes to be completed; exception, patient has scheduled wound care visit that day. **Please direct any NON-WOUND related issues/requests for orders to patient's Primary Care  Physician. **If current dressing causes regression in wound condition, may D/C ordered dressing product/s and apply Normal Saline Moist Dressing daily until next [facility] or Other MD appointment. **Notify [facility] of regression in wound condition at [phone]. Medications-Please add to medication list.: Wound #3 Right Metatarsal head  fifth: P.O. Antibiotics - Bactrim Laboratory ordered were: Wound culture routine - Right met head fifth The following medication(s) was prescribed: Bactrim DS oral 800 mg-160 mg tablet 1 1 tablet oral taken 2 times per day for 14 days. Do not take potassium while taking this medication starting 06/08/2020 WOUND #3: - Metatarsal head fifth Wound Laterality: Right Cleanser: Normal Saline (Generic) 2 x Per Week/30 Days Discharge Instructions: Wash your hands with soap and water. Remove old dressing, discard into plastic bag and place into trash. Cleanse the wound with Normal Saline prior to applying a clean dressing using gauze sponges, not tissues or cotton balls. Do not scrub or use excessive force. Pat dry using gauze sponges, not tissue or cotton balls. Primary Dressing: Silvercel Small 2x2 (in/in) 2 x Per Week/30 Days Discharge Instructions: Apply Silvercel Small 2x2 (in/in) as instructed Secondary Dressing: Mepilex Border Flex, 4x4 (in/in) (Generic) 2 x Per Week/30 Days Discharge Instructions: Apply to wound as directed. Do not cut. 1. Would recommend currently that we switch back to the silver alginate dressing I think this can be a better way to go. 2. We will cover this with a border foam dressing as well. 3. I am also can recommend currently that we have the patient go ahead and be placed back on Bactrim DS I think this is a good option to try to help keep the infection under control here. If we need to make any changes based on the results of the culture that I obtained today will make those changes at that point but for now I  believe this is good to be the ideal way to go. We will see patient back for reevaluation in 1 week here in the clinic. If anything worsens or changes patient will contact our office for additional recommendations. [name], [name] ([id number]) Electronic Signature(s) Signed: 06/08/2020 11:03:56 AM By: Worthy Keeler PA-C Entered By: Worthy Keeler on 06/08/2020 11:03:56 [name], [name] ([id number]) -------------------------------------------------------------------------------- SuperBill Details Patient Name: [name] Date of Service: 06/08/2020 Medical Record Number: [id number] Patient Account Number: [id number] Date of Birth/Sex: 05-02-1929 (87 y.o. F) Treating RN: Dolan Amen Primary Care Provider: Ria Bush Other Clinician: Jeanine Luz Referring Provider: Ria Bush Treating Provider/Extender: Skipper Cliche in Treatment: 34 Diagnosis Coding ICD-10 Codes Code Description I73.89 Other specified peripheral vascular diseases L97.512 Non-pressure chronic ulcer of other part of right foot with fat layer exposed S41.102A Unspecified open wound of left upper arm, initial encounter Z79.01 Long term (current) use of anticoagulants I48.0 Paroxysmal atrial fibrillation B35.3 Tinea pedis Facility Procedures CPT4 Code: 08657846 Description: [id number] - DEBRIDE WOUND 1ST 20 SQ CM OR < Modifier: Quantity: 1 CPT4 Code: Description: ICD-10 Diagnosis Description L97.512 Non-pressure chronic ulcer of other part of right foot with fat layer exp Modifier: osed Quantity: Physician Procedures CPT4 Code: 2841324 Description: 99214 - WC PHYS LEVEL 4 - EST PT Modifier: 25 Quantity: 1 CPT4 Code: Description: ICD-10 Diagnosis Description I73.89 Other specified peripheral vascular diseases L97.512 Non-pressure chronic ulcer of other part of right foot with fat layer exp S41.102A Unspecified open wound of left upper arm, initial encounter Z79.01  Long term (current) use  of anticoagulants Modifier: osed Quantity: CPT4 Code: [id number] Description: 97597 - WC PHYS DEBR WO ANESTH 20 SQ CM Modifier: Quantity: 1 CPT4 Code: Description: ICD-10 Diagnosis Description L97.512 Non-pressure chronic ulcer of other part of right foot with fat layer exp Modifier: osed Quantity: Electronic Signature(s) Signed: 06/08/2020 11:04:11 AM By: Worthy Keeler PA-C Entered By:  Worthy Keeler on 06/08/2020 11:04:10

## 2020-06-09 ENCOUNTER — Other Ambulatory Visit
Admission: RE | Admit: 2020-06-09 | Discharge: 2020-06-09 | Disposition: A | Discharge status: Home / Self Care | Payer: Medicare Other | Source: Ambulatory Visit | Attending: Physician Assistant | Admitting: Physician Assistant

## 2020-06-09 DIAGNOSIS — Z7901 Long term (current) use of anticoagulants: Secondary | ICD-10-CM | POA: Diagnosis not present

## 2020-06-09 DIAGNOSIS — E7849 Other hyperlipidemia: Secondary | ICD-10-CM | POA: Diagnosis not present

## 2020-06-09 DIAGNOSIS — S62102D Fracture of unspecified carpal bone, left wrist, subsequent encounter for fracture with routine healing: Secondary | ICD-10-CM | POA: Diagnosis not present

## 2020-06-09 DIAGNOSIS — Z792 Long term (current) use of antibiotics: Secondary | ICD-10-CM | POA: Diagnosis not present

## 2020-06-09 DIAGNOSIS — E538 Deficiency of other specified B group vitamins: Secondary | ICD-10-CM | POA: Diagnosis not present

## 2020-06-09 DIAGNOSIS — G9009 Other idiopathic peripheral autonomic neuropathy: Secondary | ICD-10-CM | POA: Diagnosis not present

## 2020-06-09 DIAGNOSIS — G2581 Restless legs syndrome: Secondary | ICD-10-CM | POA: Diagnosis not present

## 2020-06-09 DIAGNOSIS — I4891 Unspecified atrial fibrillation: Secondary | ICD-10-CM | POA: Diagnosis not present

## 2020-06-09 DIAGNOSIS — I2510 Atherosclerotic heart disease of native coronary artery without angina pectoris: Secondary | ICD-10-CM | POA: Diagnosis not present

## 2020-06-09 DIAGNOSIS — K219 Gastro-esophageal reflux disease without esophagitis: Secondary | ICD-10-CM | POA: Diagnosis not present

## 2020-06-09 DIAGNOSIS — L97513 Non-pressure chronic ulcer of other part of right foot with necrosis of muscle: Secondary | ICD-10-CM | POA: Diagnosis not present

## 2020-06-09 DIAGNOSIS — L97511 Non-pressure chronic ulcer of other part of right foot limited to breakdown of skin: Secondary | ICD-10-CM | POA: Diagnosis not present

## 2020-06-09 DIAGNOSIS — I5032 Chronic diastolic (congestive) heart failure: Secondary | ICD-10-CM | POA: Diagnosis not present

## 2020-06-09 DIAGNOSIS — E559 Vitamin D deficiency, unspecified: Secondary | ICD-10-CM | POA: Diagnosis not present

## 2020-06-09 DIAGNOSIS — N1831 Chronic kidney disease, stage 3a: Secondary | ICD-10-CM | POA: Diagnosis not present

## 2020-06-09 DIAGNOSIS — Z7982 Long term (current) use of aspirin: Secondary | ICD-10-CM | POA: Diagnosis not present

## 2020-06-09 DIAGNOSIS — Z9181 History of falling: Secondary | ICD-10-CM | POA: Diagnosis not present

## 2020-06-09 DIAGNOSIS — M5136 Other intervertebral disc degeneration, lumbar region: Secondary | ICD-10-CM | POA: Diagnosis not present

## 2020-06-09 DIAGNOSIS — L97509 Non-pressure chronic ulcer of other part of unspecified foot with unspecified severity: Secondary | ICD-10-CM | POA: Diagnosis not present

## 2020-06-09 DIAGNOSIS — I130 Hypertensive heart and chronic kidney disease with heart failure and stage 1 through stage 4 chronic kidney disease, or unspecified chronic kidney disease: Secondary | ICD-10-CM | POA: Diagnosis not present

## 2020-06-09 DIAGNOSIS — I872 Venous insufficiency (chronic) (peripheral): Secondary | ICD-10-CM | POA: Diagnosis not present

## 2020-06-09 DIAGNOSIS — I272 Pulmonary hypertension, unspecified: Secondary | ICD-10-CM | POA: Diagnosis not present

## 2020-06-09 DIAGNOSIS — M1990 Unspecified osteoarthritis, unspecified site: Secondary | ICD-10-CM | POA: Diagnosis not present

## 2020-06-09 DIAGNOSIS — M810 Age-related osteoporosis without current pathological fracture: Secondary | ICD-10-CM | POA: Diagnosis not present

## 2020-06-11 DIAGNOSIS — I272 Pulmonary hypertension, unspecified: Secondary | ICD-10-CM | POA: Diagnosis not present

## 2020-06-11 DIAGNOSIS — E559 Vitamin D deficiency, unspecified: Secondary | ICD-10-CM | POA: Diagnosis not present

## 2020-06-11 DIAGNOSIS — Z7982 Long term (current) use of aspirin: Secondary | ICD-10-CM | POA: Diagnosis not present

## 2020-06-11 DIAGNOSIS — I5032 Chronic diastolic (congestive) heart failure: Secondary | ICD-10-CM | POA: Diagnosis not present

## 2020-06-11 DIAGNOSIS — Z7901 Long term (current) use of anticoagulants: Secondary | ICD-10-CM | POA: Diagnosis not present

## 2020-06-11 DIAGNOSIS — I130 Hypertensive heart and chronic kidney disease with heart failure and stage 1 through stage 4 chronic kidney disease, or unspecified chronic kidney disease: Secondary | ICD-10-CM | POA: Diagnosis not present

## 2020-06-11 DIAGNOSIS — E538 Deficiency of other specified B group vitamins: Secondary | ICD-10-CM | POA: Diagnosis not present

## 2020-06-11 DIAGNOSIS — I872 Venous insufficiency (chronic) (peripheral): Secondary | ICD-10-CM | POA: Diagnosis not present

## 2020-06-11 DIAGNOSIS — E7849 Other hyperlipidemia: Secondary | ICD-10-CM | POA: Diagnosis not present

## 2020-06-11 DIAGNOSIS — M1990 Unspecified osteoarthritis, unspecified site: Secondary | ICD-10-CM | POA: Diagnosis not present

## 2020-06-11 DIAGNOSIS — K219 Gastro-esophageal reflux disease without esophagitis: Secondary | ICD-10-CM | POA: Diagnosis not present

## 2020-06-11 DIAGNOSIS — G2581 Restless legs syndrome: Secondary | ICD-10-CM | POA: Diagnosis not present

## 2020-06-11 DIAGNOSIS — M5136 Other intervertebral disc degeneration, lumbar region: Secondary | ICD-10-CM | POA: Diagnosis not present

## 2020-06-11 DIAGNOSIS — G9009 Other idiopathic peripheral autonomic neuropathy: Secondary | ICD-10-CM | POA: Diagnosis not present

## 2020-06-11 DIAGNOSIS — I2510 Atherosclerotic heart disease of native coronary artery without angina pectoris: Secondary | ICD-10-CM | POA: Diagnosis not present

## 2020-06-11 DIAGNOSIS — I4891 Unspecified atrial fibrillation: Secondary | ICD-10-CM | POA: Diagnosis not present

## 2020-06-11 DIAGNOSIS — N1831 Chronic kidney disease, stage 3a: Secondary | ICD-10-CM | POA: Diagnosis not present

## 2020-06-11 DIAGNOSIS — M810 Age-related osteoporosis without current pathological fracture: Secondary | ICD-10-CM | POA: Diagnosis not present

## 2020-06-11 DIAGNOSIS — Z9181 History of falling: Secondary | ICD-10-CM | POA: Diagnosis not present

## 2020-06-11 DIAGNOSIS — Z792 Long term (current) use of antibiotics: Secondary | ICD-10-CM | POA: Diagnosis not present

## 2020-06-11 DIAGNOSIS — S62102D Fracture of unspecified carpal bone, left wrist, subsequent encounter for fracture with routine healing: Secondary | ICD-10-CM | POA: Diagnosis not present

## 2020-06-11 DIAGNOSIS — L97513 Non-pressure chronic ulcer of other part of right foot with necrosis of muscle: Secondary | ICD-10-CM | POA: Diagnosis not present

## 2020-06-12 ENCOUNTER — Encounter: Payer: Self-pay | Admitting: Physician Assistant

## 2020-06-12 ENCOUNTER — Other Ambulatory Visit: Payer: Self-pay

## 2020-06-12 ENCOUNTER — Ambulatory Visit: Payer: Medicare Other | Admitting: Physician Assistant

## 2020-06-12 VITALS — BP 120/60 | HR 62 | Wt 129.0 lb

## 2020-06-12 DIAGNOSIS — I5032 Chronic diastolic (congestive) heart failure: Secondary | ICD-10-CM

## 2020-06-12 DIAGNOSIS — R0600 Dyspnea, unspecified: Secondary | ICD-10-CM | POA: Diagnosis not present

## 2020-06-12 DIAGNOSIS — I10 Essential (primary) hypertension: Secondary | ICD-10-CM | POA: Diagnosis not present

## 2020-06-12 DIAGNOSIS — R0609 Other forms of dyspnea: Secondary | ICD-10-CM

## 2020-06-12 DIAGNOSIS — I480 Paroxysmal atrial fibrillation: Secondary | ICD-10-CM

## 2020-06-12 DIAGNOSIS — I4892 Unspecified atrial flutter: Secondary | ICD-10-CM

## 2020-06-12 DIAGNOSIS — I739 Peripheral vascular disease, unspecified: Secondary | ICD-10-CM

## 2020-06-12 DIAGNOSIS — E785 Hyperlipidemia, unspecified: Secondary | ICD-10-CM | POA: Diagnosis not present

## 2020-06-12 DIAGNOSIS — I2721 Secondary pulmonary arterial hypertension: Secondary | ICD-10-CM | POA: Diagnosis not present

## 2020-06-12 LAB — AEROBIC CULTURE W GRAM STAIN (SUPERFICIAL SPECIMEN)

## 2020-06-12 NOTE — Progress Notes (Signed)
Office Visit    Patient Name: [name] Date of Encounter: 06/12/2020  Primary Care Provider:  Ria Bush, MD Primary Cardiologist:  Crystal Sacramento, MD  Chief Complaint    Chief Complaint  Patient presents with  . Other    4 month follow up - Patient c.o SOB and being tired. Meds reviewed verbally with patient.     87 year old female with history of atrial flutter/ fibrillation on reduced dose Eliquis 2.5 mg twice daily (see below regarding reduced dosing), HFpEF, pulmonary hypertension, MR/TR/AR, essential hypertension, hyperlipidemia, PAD with recent angiography of tibial disease and underwent angioplasty on bilateral tibial vessels, chronic fatigue, recent 3/30 CT showing vertebral body compression fractures at T5 and L1, and here for follow-up after cardiac catheterization.  Past Medical History    Past Medical History:  Diagnosis Date  . Abdominal aortic atherosclerosis ([place]) 09/2015   by xray  . Anxiety   . BCC (basal cell carcinoma of skin) 05/2011   on nose  . Breast lesion    a. diagnosed with complex sclerosing lesion with calcifications of the right breast in 08/2016  . Chest pain    a. nuclear stress test 03/2012: normal; b. 09/2018 MV: EF >65%, no isch/scar.   . DDD (degenerative disc disease), lumbosacral 12/06/1992  . Gallstones 09/2015   incidentally by xray  . GERD (gastroesophageal reflux disease) 05/1999  . HERPES ZOSTER 04/13/2007   Qualifier: Diagnosis of  By: Crystal Better FNP, [name]   . Hiatal hernia   . History of shingles    ophthalmic, takes acyclovir daily  . Hyperlipidemia 12/2003  . Hypertension 1990  . Mitral regurgitation    a. echo 03/2012: EF 60-65%, no RWMA, mild to mod AI/MR, mod TR, PASP 37 mmHg  . Osteoarthritis 08/21/1989  . Osteoporosis with fracture 11/1997   with compression fracture T12  . Persistent atrial fibrillation ([place]) 03/16/2012   a. CHADS2VASc => 5 (HTN, age x 2, vascular disease, sex category)-->Eliquis  5 BID;  b. Recurrent AF in 06/2016;  c. 01/2017 s/p DCCV;  d. 02/2017 recurrent AFib->Amio added->DCCV; e. 03/2017 recurrent AFib, s/p reload of amio and DCCV 04/02/2017  . POSTHERPETIC NEURALGIA 04/20/2007   Qualifier: Diagnosis of  By: Crystal Better FNP, [name]   . Rheumatoid arthritis ([place])   . RLS (restless legs syndrome)    Past Surgical History:  Procedure Laterality Date  . ABDOMINAL HYSTERECTOMY  Age 21 - 55   S/P TAH  . abdominal ultrasound  04/23/2004   Gallstones  . BREAST EXCISIONAL BIOPSY Right 2018   neg/benign complex sclerosing lesion  . BREAST LUMPECTOMY WITH RADIOACTIVE SEED LOCALIZATION Right 10/2016   breast lumpectomy with radioactive seed localization and margin assessment for complex sclerosing lesion [facility])  . BREAST LUMPECTOMY WITH RADIOACTIVE SEED LOCALIZATION Right 11/05/2016   Procedure: RIGHT BREAST LUMPECTOMY WITH RADIOACTIVE SEED LOCALIZATION;  Surgeon: Crystal Skates, MD;  Location: [place];  Service: General;  Laterality: Right;  . CARDIAC CATHETERIZATION    . CARDIOVERSION N/A 01/31/2017   Procedure: CARDIOVERSION;  Surgeon: Crystal Hampshire, MD;  Location: [facility];  Service: Cardiovascular;  Laterality: N/A;  . CARDIOVERSION N/A 03/03/2017   Procedure: CARDIOVERSION;  Surgeon: Crystal Hampshire, MD;  Location: [place] [facility];  Service: Cardiovascular;  Laterality: N/A;  . CARDIOVERSION N/A 04/02/2017   Procedure: CARDIOVERSION;  Surgeon: Crystal Hampshire, MD;  Location: [facility];  Service: Cardiovascular;  Laterality: N/A;  . CARDIOVERSION N/A 01/16/2018   Procedure: CARDIOVERSION;  Surgeon:  Crystal Hampshire, MD;  Location: [facility];  Service: Cardiovascular;  Laterality: N/A;  . CATARACT EXTRACTION  05/2010   left eye  . CERVICAL DISCECTOMY  1992   Fusion ([name])  . ESI Bilateral 01/2016   S1 transforaminal ESI x3  . ESOPHAGOGASTRODUODENOSCOPY  01/01/2002   with ulcer, bx. neg; + stricture gastric ulcer with hemorrhage   . ESOPHAGOGASTRODUODENOSCOPY  04/23/2004   Esophageal stricture -- dilated  . INTRAVASCULAR PRESSURE WIRE/FFR STUDY N/A 07/12/2019   Procedure: INTRAVASCULAR PRESSURE WIRE/FFR STUDY;  Surgeon: Crystal Hampshire, MD;  Location: [place] [facility];  Service: Cardiovascular;  Laterality: N/A;  . LOWER EXTREMITY ANGIOGRAPHY Left 02/01/2019   LOWER EXTREMITY ANGIOGRAPHY;  Surgeon: Crystal Huxley, MD  . LOWER EXTREMITY ANGIOGRAPHY Right 03/25/2019   Procedure: LOWER EXTREMITY ANGIOGRAPHY;  Surgeon: Crystal Huxley, MD;  Location: [place] [facility];  Service: Cardiovascular;  Laterality: Right;  . LOWER EXTREMITY ANGIOGRAPHY Right 08/02/2019   Procedure: LOWER EXTREMITY ANGIOGRAPHY;  Surgeon: Crystal Huxley, MD;  Location: [place] [facility];  Service: Cardiovascular;  Laterality: Right;  . nuclear stress test  03/2012   no ischemia  . PERIPHERAL VASCULAR BALLOON ANGIOPLASTY Left 01/2019   Percutaneous transluminal angioplasty of left anterior and posterior tibial artery with 2.5 mm diameter by 22 cm length angioplasty balloon ([place])  . RIGHT/LEFT HEART CATH AND CORONARY ANGIOGRAPHY Bilateral 07/12/2019   Procedure: RIGHT/LEFT HEART CATH AND CORONARY ANGIOGRAPHY;  Surgeon: Crystal Hampshire, MD;  Location: [place] [facility];  Service: Cardiovascular;  Laterality: Bilateral;  . SKIN CANCER EXCISION  05/20/2011   BCC from nose  . US ECHOCARDIOGRAPHY  03/2012   in flutter, EF 60%, mild-mod aortic, mitral, tricuspid regurg, mildly dilated LA    Allergies  Allergies  Allergen Reactions  . Penicillins Rash and Other (See Comments)    Childhood Allergy Has patient had a PCN reaction causing immediate rash, facial/tongue/throat swelling, SOB or lightheadedness with hypotension: Yes Has patient had a PCN reaction causing severe rash involving mucus membranes or skin necrosis: Unknown Has patient had a PCN reaction that required hospitalization: No Has patient had a PCN reaction occurring within the last  10 years: No If all of the above answers are "NO", then may proceed with Cephalosporin use.     History of Present Illness    [name] is a 87 y.o. female with PMH as above. She has a history of hearing loss.  She has a known history of persistent atrial fibrillation and flutter maintaining sinus rhythm with amiodarone, chronic dyspnea and fatigue, essential hypertension, hyperlipidemia, PAD, and rheumatoid arthritis.   She underwent angioplasty on bilateral tibial vessels with improvement in symptoms.    05/2019 echo showed nl LVSF, G2DD, severe pulmonary HTN, moderate MR, moderate TR.  Seen in clinic with ongoing and progressive DOE/fatigue, as well as concern for worsening PAD.  She became significantly short of breath when she attempted to stand during her visit . [facility] appointment was recommended, given following her recent revascularization by [facility], she initially had pink feet that were warm at night but had since turned cyanotic and cold (with continued numbness and weakness).   [facility] 07/12/2019 showed mild to moderate nonobstructive CAD.  The worst stenosis was in the ostium of the RCA, which was calcified and eccentric.  Right heart cardiac catheterization showed mildly elevated left sided filling pressure with PCWP 15 mmHg and mild pulmonary hypertension at 41/13 mmHg with low normal cardiac output at 4.02  and cardiac index 2.48.  Pulmonary vascular resistance was 2.74 Woods unit.  She was continued on her same dose of furosemide.  After her cath, she had CP between her shoulder blades with certain positions and not with deeper breathing. She thought it could be 2/2 a muscle strain, given it occurred after she reached in a funny way to grab something right before it occurred.  She had stable SOB/DOE.  She had right lower extremity pain, worse along the medial aspect of her right calf. She continued to note discoloration and pain in her lower extremities.  In July, she suffered a fracture  to her R wrist after a fall. She had AOC renal failure, improving after discontinuing lasix. She had LE arterial angiogram by Dr. Lucky Cowboy with endovascular intervention and tibial vessels bilaterally.   When last seen in clinic, she noted another fall and back discomfort. She continued to note DOE and some swelling in her R leg, improving with elevation. She was continued on medical management without a diuretic, though it was noted she could consider lasix 66m once daily if needed. It was noted she could consider Coreg if BP continued to be elevated. She was kept at Eliquis 2.576mBID, given her recurrent falls (as no longer met criteria for reduced dosing). It was noted that there was no clear cardiac explanation for her DOE. PFTs could be considered, though given her chronic pain, it was doubtful she could give a good effort.   Today, 06/12/20, she returns to clinic and feels well from a cardiovascular standpoint. She notes stable DOE. No recent falls, though she does note falling back into chairs at times, confirmed by her daughter. He rlast fall was estimated as 3 weeks ago, at which time she fell into a chair and to her knees. No falls since then. She notes that she is much improved after her revascularization of LE and that her R foot seems to finally be healing. No CP or tachypalpitations. No presyncope. She does have some bendopnea. She sleeps on 1 pillow. She reports hip pain. With her hip pain and breathing, she can only walk short distances. She does feel tired, at least in part due to deconditioning. However, she makes certain to use her walker.  No s/sx of bleeding. She reports medication compliance.    Home Medications    Prior to Admission medications   Medication Sig Start Date End Date Taking? Authorizing Provider  acetaminophen (TYLENOL) 500 MG tablet Take 1,000 mg every 8 (eight) hours as needed by mouth for moderate pain.     [provider]  acyclovir (ZOVIRAX) 400 MG tablet Take  400 mg 2 (two) times daily by mouth.     [provider]  amiodarone (PACERONE) 100 MG tablet Take 1 tablet (100 mg total) by mouth daily. Patient taking differently: Take 100 mg by mouth in the morning and at bedtime.  05/28/19   ArWellington HampshireMD  aspirin EC 81 MG tablet Take 1 tablet (81 mg total) by mouth daily. 02/01/19   DeAlgernon HuxleyMD  atorvastatin (LIPITOR) 20 MG tablet TAKE 1 TABLET BY MOUTH ONCE DAILY 07/19/19   GuRia BushMD  DULoxetine (CYMBALTA) 60 MG capsule TAKE 1 CAPSULE BY MOUTH ONCE DAILY 07/19/19   GuRia BushMD  ELIQUIS 2.5 MG TABS tablet TAKE ONE TABLET BY MOUTH TWICE DAILY Patient taking differently: Take 2.5 mg by mouth 2 (two) times daily.  01/13/19   ArWellington HampshireMD  ergocalciferol (  VITAMIN D2) 1.25 MG (50000 UT) capsule Take 1 capsule (50,000 Units total) by mouth once a week. Patient taking differently: Take 50,000 Units by mouth once a week. Saturday 05/17/19   Crystal Bush, MD  furosemide (LASIX) 40 MG tablet Take 1 tablet (40 mg total) by mouth daily. 05/28/19   Crystal Hampshire, MD  gabapentin (NEURONTIN) 300 MG capsule Take 1 capsule (300 mg total) by mouth at bedtime. 05/05/19   Crystal Bush, MD  lisinopril (ZESTRIL) 10 MG tablet Take 1 tablet (10 mg total) by mouth daily. 05/28/19   Crystal Hampshire, MD  metoprolol tartrate (LOPRESSOR) 25 MG tablet Take 0.5 tablets (12.5 mg total) by mouth 2 (two) times daily. 08/13/18   Crystal Hampshire, MD  pantoprazole (PROTONIX) 40 MG tablet TAKE 1 TABLET BY MOUTH EVERY DAY 07/19/19   Crystal Bush, MD  potassium chloride SA (KLOR-CON) 20 MEQ tablet Take 0.5 tablets (10 mEq total) by mouth 2 (two) times daily. 07/21/19   Crystal Bush, MD  prednisoLONE acetate (PRED FORTE) 1 % ophthalmic suspension Place 1 drop into the left eye daily.     [provider]  rOPINIRole (REQUIP) 1 MG tablet TAKE 2 TABLETS BY MOUTH AT BEDTIME Patient taking differently: Take 2 mg by mouth at  bedtime.  06/14/19   Crystal Bush, MD  tiZANidine (ZANAFLEX) 2 MG tablet Take 2 mg by mouth every evening.  12/04/17   Crystal Bush, MD  traMADol (ULTRAM) 50 MG tablet Take 50 mg by mouth daily.  12/04/17   Crystal Bush, MD    Review of Systems    She denies CP, palpitations, pnd, orthopnea, n, v, dizziness, syncope, weight gain, or early satiety.  She notes stable/chronic DOE and shortness of breath. She reports improved RLE wound and improved bilateral LE edema and pain since revascularization. She has fallen back into chairs but denies any significant falls since her last visit.   All other systems reviewed and are otherwise negative except as noted above.  Physical Exam    VS:  BP 120/60 (BP Location: Right Arm, Patient Position: Sitting, Cuff Size: Normal)   Pulse 62   Wt 129 lb (58.5 kg)   SpO2 94%   BMI 23.59 kg/m  , BMI Body mass index is 23.59 kg/m. GEN: Well nourished, well developed, in no acute distress. Seated in wheelchair.  HEENT: normal. Neck: Supple, no JVD, carotid bruits, or masses. Cardiac: RRR, 2/6 systolic murmur in RUSB. No rubs, or gallops. No clubbing. Bilateral lower extremity / pedal cyanosis as noted in past. Improves with elevation. R foot wound along the lateral side of the foot is still healing.  Radials 2+ and equal bilaterally. DP/PT unable to be assessed but blood flow confirmed with doppler.  Respiratory:  Respirations regular and unlabored, clear to auscultation bilaterally. GI: Soft, nontender, nondistended, BS + x 4. MS: Bilateral lower extremity / pedal cyanosis and RLE ulcer along the lateral side of the foot. Reduced pedal pulses.   Skin: warm and dry, no rash. Neuro:  Strength and sensation are intact. Psych: Normal affect.  Accessory Clinical Findings    ECG personally reviewed by me today - NSR, 62bpm,  PRi 180, QTc 420m, poor R wave in inferior leads and anterior leads  not new when compared with prior tracings, TWI in V1/2  stable from previous EKGs - no acute changes.  VITALS Reviewed today   Temp Readings from Last 3 Encounters:  04/02/20 98 F (36.7 C) (Oral)  03/22/20 97.8  F (36.6 C) (Temporal)  02/21/20 98 F (36.7 C) (Temporal)   BP Readings from Last 3 Encounters:  06/12/20 120/60  05/23/20 (!) 152/76  04/02/20 (!) 151/78   Pulse Readings from Last 3 Encounters:  06/12/20 62  05/23/20 68  04/02/20 61    Wt Readings from Last 3 Encounters:  06/12/20 129 lb (58.5 kg)  05/23/20 129 lb (58.5 kg)  04/02/20 135 lb 2.3 oz (61.3 kg)     LABS  reviewed today     Lab Results  Component Value Date   WBC 6.3 11/16/2019   HGB 13.3 11/16/2019   HCT 40.2 11/16/2019   MCV 98.9 11/16/2019   PLT 301.0 11/16/2019   Lab Results  Component Value Date   CREATININE 0.85 02/21/2020   BUN 11 02/21/2020   NA 142 02/21/2020   K 3.6 02/21/2020   CL 105 02/21/2020   CO2 30 02/21/2020   Lab Results  Component Value Date   ALT 18 10/23/2019   AST 20 10/23/2019   ALKPHOS 45 10/23/2019   BILITOT 0.5 10/23/2019   Lab Results  Component Value Date   CHOL 134 04/28/2019   HDL 67.20 04/28/2019   LDLCALC 50 04/28/2019   LDLDIRECT 173.1 02/19/2010   TRIG 85.0 04/28/2019   CHOLHDL 2 04/28/2019    No results found for: HGBA1C Lab Results  Component Value Date   TSH 3.62 02/21/2020     STUDIES/PROCEDURES reviewed today   [facility] 07/12/19  Ost LAD lesion is 40% stenosed.  Mid LAD lesion is 30% stenosed.  The left ventricular systolic function is normal.  LV end diastolic pressure is mildly elevated.  The left ventricular ejection fraction is 55-65% by visual estimate.  Ost RCA to Prox RCA lesion is 50% stenosed 1.  Mild to moderate nonobstructive coronary artery disease.  The worst stenosis was 50% in the ostium of the right coronary artery which was calcified and eccentric.  Fractional flow reserve was done on this lesion and was not significant with an iFR ratio of 0.98. 2.  Normal LV  systolic function. 3.  Right heart catheterization showed mildly elevated left-sided filling pressure with pulmonary capillary wedge pressure 15 mmHg, mild pulmonary hypertension at 41 over 13 mmHg and low normal cardiac output at 4.02 with a cardiac index of 2.48.  Pulmonary vascular resistance is 2.74 Woods unit Recommendations: Recommend medical therapy for nonobstructive coronary artery disease. Volume status appears reasonable and recommend continuing same dose of furosemide. The patient seems to be reasonably optimized from a cardiac standpoint. Avoid catheterization via the right radial artery in the future if needed given small vessel diameter which is diffusely diseased.  3/30 CT IMPRESSION: VASCULAR 1. Mild atherosclerotic disease throughout the aorta without evidence for an aortic aneurysm or dissection. Aortic Atherosclerosis (ICD10-I70.0). 2. No evidence for inflow or outflow disease. 3. Concern for bilateral runoff disease but limited evaluation of the runoff vessels bilaterally. Minimal flow in the distal runoff vessels but this could be related to technical factors and timing of the study. 4. At least mild stenosis involving the main renal arteries. 5. No evidence for a large or central pulmonary embolism. NON-VASCULAR 1. Vertebral body compression fractures at T5 and L1. Age of these fractures are unknown but the fractures could be associated with the patient's pain. 2. Cholelithiasis with distended gallbladder. No evidence for gallbladder inflammation. 3. Large amount of stool in the colon. 4. Moderate sized hiatal hernia. 5. No acute chest abnormality.  07/06/19 Bilateral ABIs appear  essentially unchanged compared to prior study on  04/23/2019. Bilateral TBIs appear essentially unchanged compared to prior  study on 1/15/20212.  Summary:  Right: Resting right ankle-brachial index is within normal range. No  evidence of significant right lower extremity arterial  disease. The right  toe-brachial index is abnormal.  Left: Resting left ankle-brachial index is within normal range. No  evidence of significant left lower extremity arterial disease. The left  toe-brachial index is abnormal.  05/28/19 Echo 1. Left ventricular ejection fraction, by estimation, is 60 to 65%. The  left ventricle has normal function. The left ventricle has no regional  wall motion abnormalities. There is mild left ventricular hypertrophy.  Left ventricular diastolic parameters  are consistent with Grade II diastolic dysfunction (pseudonormalization).  2. Right ventricular systolic function is normal. The right ventricular  size is mildly enlarged. mildly increased right ventricular wall  thickness. There is severely elevated pulmonary artery systolic pressure.  3. Left atrial size was mild to moderately dilated.  4. Right atrial size was mildly dilated.  5. Moderate mitral valve regurgitation.  6. Tricuspid valve regurgitation is moderate.  7. Aortic valve regurgitation is mild to moderate.  8. The inferior vena cava is normal in size with <50% respiratory  variability, suggesting right atrial pressure of 8 mmHg.   NM 09/2018  Normal pharmacologic myocardial perfusion stress test without significant ischemia or scar.  The left ventricular ejection fraction is normal (>65%).  This is a low risk study.  Attenuation correction CT is notable for coronary artery and aortic atherosclerotic calcification, as well as aortic valve and mitral annular calcification.  A moderate-sided hiatal hernia is present.  Hyperdensity of the liver is noted, which can be seen with long-term amiodarone use. Clinical/laboratory correlation recommended.  Splenic calcifications suggest prior granulomatous disease.   Assessment & Plan    Chronic diastolic heart failure, Mild pulmonary hypertension Valvular Dz: Moderate MR/TR and mild to moderate AR DOE, SOB --S/p R/LHC,  performed for ongoing SOB/DOE. 07/12/18 cath results with recommendation for continued medical management. Echo above with moderate MR/TR and mild to moderate AR. Stable dyspnea/SOB with consideration of some element of deconditioning and valvular dz. Will periodically update echo. She is euvolemic on exam. Previously, lasix 58m recommended to be reconsidered at RTC. Given euvolemic and soft BP, will continue to defer. Continue current medications. As previously noted, future considerations could include PFTs if sx of SOB/DOE; however, will defer for now and as per discussion with pt today and given likely poor effort.  Essential hypertension --BP well controlled. Will refrain from changing any medications, given h/o falls. Continue current BB, lisinopril.  HLD --Continue current statin.    Paroxysmal atrial flutter and atrial fibrillation --Maintaining NSR.  She is taking amiodarone 100 mg daily and metoprolol 12.5 mg twice daily.  Continue low-dose Eliquis 2.5 mg daily. Per primary cardiologist, low dose Eliqius preferred given history of falls and to reduce risk of bleeding.   Peripheral arterial disease with cyanosis / cool extremities / pedal pulses undetected but confirmed by echo; nonhealing R foot wound --S/p intervention with further recommendations per vascular surgery.  Continue ASA and statin.   Disposition: RTC 4 months or sooner if needed   JArvil Chaco PA-C 06/12/2020

## 2020-06-12 NOTE — Telephone Encounter (Signed)
LVM for patient to return call to office regarding Prolia injection.

## 2020-06-12 NOTE — Patient Instructions (Signed)
Medication Instructions:  Your physician recommends that you continue on your current medications as directed. Please refer to the Current Medication list given to you today.  *If you need a refill on your cardiac medications before your next appointment, please call your pharmacy*   Lab Work: None ordered  Testing/Procedures: None ordered  Follow-Up: At [facility], you and your health needs are our priority.  As part of our continuing mission to provide you with exceptional heart care, we have created designated Provider Care Teams.  These Care Teams include your primary Cardiologist (physician) and Advanced Practice Providers (APPs -  Physician Assistants and Nurse Practitioners) who all work together to provide you with the care you need, when you need it.  We recommend signing up for the patient portal called "MyChart".  Sign up information is provided on this After Visit Summary.  MyChart is used to connect with patients for Virtual Visits (Telemedicine).  Patients are able to view lab/test results, encounter notes, upcoming appointments, etc.  Non-urgent messages can be sent to your provider as well.   To learn more about what you can do with MyChart, go to [email or web address].    Your next appointment:   4 month(s)  The format for your next appointment:   In Person  Provider:   You may see [name], MD or one of the following Advanced Practice Providers on your designated Care Team:    Murray Hodgkins, NP  Christell Faith, PA-C  Marrianne Mood, PA-C  Cadence [place], [place]  Laurann Montana, NP

## 2020-06-13 NOTE — Telephone Encounter (Signed)
Called and spoke with patients daughter who stated that the patient is coming in this week for labs and will be seeing Dr. Danise Mina for her CPE next week. Informed patients daughter of the OOP cost of $280. Daughter stated that she wanted to speak with Dr. Danise Mina about the injection first at the visit. After that, she will decide if she would like her mother to get it or not.

## 2020-06-13 NOTE — Telephone Encounter (Signed)
Patient's daughter called back stating she is returning call. Please advise and call back.

## 2020-06-14 ENCOUNTER — Other Ambulatory Visit: Payer: Self-pay

## 2020-06-14 ENCOUNTER — Ambulatory Visit: Payer: Medicare Other

## 2020-06-14 ENCOUNTER — Telehealth: Payer: Self-pay | Admitting: Family Medicine

## 2020-06-14 ENCOUNTER — Other Ambulatory Visit: Payer: Medicare Other

## 2020-06-14 NOTE — Telephone Encounter (Signed)
That's fine. Thank you for letting me know. I went ahead and removed patient off my schedule for today.

## 2020-06-14 NOTE — Telephone Encounter (Signed)
daugther patricia called in wanted to change the appointment

## 2020-06-15 ENCOUNTER — Ambulatory Visit (INDEPENDENT_AMBULATORY_CARE_PROVIDER_SITE_OTHER): Payer: Medicare Other

## 2020-06-15 ENCOUNTER — Other Ambulatory Visit (INDEPENDENT_AMBULATORY_CARE_PROVIDER_SITE_OTHER): Payer: Medicare Other

## 2020-06-15 ENCOUNTER — Other Ambulatory Visit: Payer: Self-pay

## 2020-06-15 DIAGNOSIS — I2510 Atherosclerotic heart disease of native coronary artery without angina pectoris: Secondary | ICD-10-CM | POA: Diagnosis not present

## 2020-06-15 DIAGNOSIS — M1990 Unspecified osteoarthritis, unspecified site: Secondary | ICD-10-CM | POA: Diagnosis not present

## 2020-06-15 DIAGNOSIS — E782 Mixed hyperlipidemia: Secondary | ICD-10-CM

## 2020-06-15 DIAGNOSIS — E559 Vitamin D deficiency, unspecified: Secondary | ICD-10-CM

## 2020-06-15 DIAGNOSIS — Z9181 History of falling: Secondary | ICD-10-CM | POA: Diagnosis not present

## 2020-06-15 DIAGNOSIS — S62102D Fracture of unspecified carpal bone, left wrist, subsequent encounter for fracture with routine healing: Secondary | ICD-10-CM | POA: Diagnosis not present

## 2020-06-15 DIAGNOSIS — K219 Gastro-esophageal reflux disease without esophagitis: Secondary | ICD-10-CM | POA: Diagnosis not present

## 2020-06-15 DIAGNOSIS — I4819 Other persistent atrial fibrillation: Secondary | ICD-10-CM

## 2020-06-15 DIAGNOSIS — N1831 Chronic kidney disease, stage 3a: Secondary | ICD-10-CM | POA: Diagnosis not present

## 2020-06-15 DIAGNOSIS — E7849 Other hyperlipidemia: Secondary | ICD-10-CM | POA: Diagnosis not present

## 2020-06-15 DIAGNOSIS — M5136 Other intervertebral disc degeneration, lumbar region: Secondary | ICD-10-CM | POA: Diagnosis not present

## 2020-06-15 DIAGNOSIS — M810 Age-related osteoporosis without current pathological fracture: Secondary | ICD-10-CM | POA: Diagnosis not present

## 2020-06-15 DIAGNOSIS — I130 Hypertensive heart and chronic kidney disease with heart failure and stage 1 through stage 4 chronic kidney disease, or unspecified chronic kidney disease: Secondary | ICD-10-CM | POA: Diagnosis not present

## 2020-06-15 DIAGNOSIS — E538 Deficiency of other specified B group vitamins: Secondary | ICD-10-CM

## 2020-06-15 DIAGNOSIS — Z792 Long term (current) use of antibiotics: Secondary | ICD-10-CM | POA: Diagnosis not present

## 2020-06-15 DIAGNOSIS — I872 Venous insufficiency (chronic) (peripheral): Secondary | ICD-10-CM | POA: Diagnosis not present

## 2020-06-15 DIAGNOSIS — I10 Essential (primary) hypertension: Secondary | ICD-10-CM

## 2020-06-15 DIAGNOSIS — I4891 Unspecified atrial fibrillation: Secondary | ICD-10-CM | POA: Diagnosis not present

## 2020-06-15 DIAGNOSIS — Z7901 Long term (current) use of anticoagulants: Secondary | ICD-10-CM | POA: Diagnosis not present

## 2020-06-15 DIAGNOSIS — G9009 Other idiopathic peripheral autonomic neuropathy: Secondary | ICD-10-CM | POA: Diagnosis not present

## 2020-06-15 DIAGNOSIS — I272 Pulmonary hypertension, unspecified: Secondary | ICD-10-CM | POA: Diagnosis not present

## 2020-06-15 DIAGNOSIS — Z7982 Long term (current) use of aspirin: Secondary | ICD-10-CM | POA: Diagnosis not present

## 2020-06-15 DIAGNOSIS — G2581 Restless legs syndrome: Secondary | ICD-10-CM | POA: Diagnosis not present

## 2020-06-15 DIAGNOSIS — I5032 Chronic diastolic (congestive) heart failure: Secondary | ICD-10-CM | POA: Diagnosis not present

## 2020-06-15 DIAGNOSIS — L97513 Non-pressure chronic ulcer of other part of right foot with necrosis of muscle: Secondary | ICD-10-CM | POA: Diagnosis not present

## 2020-06-15 LAB — CBC WITH DIFFERENTIAL/PLATELET
Basophils Absolute: 0.1 10*3/uL (ref 0.0–0.1)
Basophils Relative: 0.8 % (ref 0.0–3.0)
Eosinophils Absolute: 0.1 10*3/uL (ref 0.0–0.7)
Eosinophils Relative: 1.2 % (ref 0.0–5.0)
HCT: 34.9 % — ABNORMAL LOW (ref 36.0–46.0)
Hemoglobin: 11.1 g/dL — ABNORMAL LOW (ref 12.0–15.0)
Lymphocytes Relative: 24.5 % (ref 12.0–46.0)
Lymphs Abs: 1.6 10*3/uL (ref 0.7–4.0)
MCHC: 31.8 g/dL (ref 30.0–36.0)
MCV: 85.1 fl (ref 78.0–100.0)
Monocytes Absolute: 0.6 10*3/uL (ref 0.1–1.0)
Monocytes Relative: 8.6 % (ref 3.0–12.0)
Neutro Abs: 4.2 10*3/uL (ref 1.4–7.7)
Neutrophils Relative %: 64.9 % (ref 43.0–77.0)
Platelets: 308 10*3/uL (ref 150.0–400.0)
RBC: 4.1 Mil/uL (ref 3.87–5.11)
RDW: 16.7 % — ABNORMAL HIGH (ref 11.5–15.5)
WBC: 6.5 10*3/uL (ref 4.0–10.5)

## 2020-06-15 LAB — COMPREHENSIVE METABOLIC PANEL
ALT: 15 U/L (ref 0–35)
AST: 17 U/L (ref 0–37)
Albumin: 4.1 g/dL (ref 3.5–5.2)
Alkaline Phosphatase: 63 U/L (ref 39–117)
BUN: 13 mg/dL (ref 6–23)
CO2: 28 mEq/L (ref 19–32)
Calcium: 9.4 mg/dL (ref 8.4–10.5)
Chloride: 101 mEq/L (ref 96–112)
Creatinine, Ser: 1.09 mg/dL (ref 0.40–1.20)
GFR: 45.19 mL/min — ABNORMAL LOW (ref >60.00)
Glucose, Bld: 91 mg/dL (ref 70–99)
Potassium: 5.3 mEq/L — ABNORMAL HIGH (ref 3.5–5.1)
Sodium: 135 mEq/L (ref 135–145)
Total Bilirubin: 0.4 mg/dL (ref 0.2–1.2)
Total Protein: 6.8 g/dL (ref 6.0–8.3)

## 2020-06-15 LAB — LIPID PANEL
Cholesterol: 133 mg/dL (ref 0–200)
HDL: 71.7 mg/dL (ref >39.00)
LDL Cholesterol: 47 mg/dL (ref 0–99)
NonHDL: 61.32
Total CHOL/HDL Ratio: 2
Triglycerides: 70 mg/dL (ref 0.0–149.0)
VLDL: 14 mg/dL (ref 0.0–40.0)

## 2020-06-15 LAB — MICROALBUMIN / CREATININE URINE RATIO
Creatinine,U: 106.3 mg/dL
Microalb Creat Ratio: 1.2 mg/g (ref 0.0–30.0)
Microalb, Ur: 1.3 mg/dL (ref 0.0–1.9)

## 2020-06-15 LAB — VITAMIN D 25 HYDROXY (VIT D DEFICIENCY, FRACTURES): VITD: 76.06 ng/mL (ref 30.00–100.00)

## 2020-06-15 LAB — VITAMIN B12: Vitamin B-12: 249 pg/mL (ref 211–911)

## 2020-06-15 MED ORDER — CYANOCOBALAMIN 1000 MCG/ML IJ SOLN
1000.0000 ug | Freq: Once | INTRAMUSCULAR | Status: AC
  Administered 2020-06-15: 1000 ug via INTRAMUSCULAR

## 2020-06-15 NOTE — Progress Notes (Signed)
Per orders of Dr. Copland, injection of vit B12 given by Felicia  Goins. Patient tolerated injection well.  

## 2020-06-15 NOTE — Addendum Note (Signed)
Addended by: Ellamae Sia on: 06/15/2020 10:25 AM   Modules accepted: Orders

## 2020-06-16 ENCOUNTER — Other Ambulatory Visit: Payer: Self-pay

## 2020-06-16 ENCOUNTER — Encounter: Payer: Medicare Other | Admitting: Physician Assistant

## 2020-06-16 DIAGNOSIS — I2510 Atherosclerotic heart disease of native coronary artery without angina pectoris: Secondary | ICD-10-CM | POA: Diagnosis not present

## 2020-06-16 DIAGNOSIS — I480 Paroxysmal atrial fibrillation: Secondary | ICD-10-CM | POA: Diagnosis not present

## 2020-06-16 DIAGNOSIS — L97513 Non-pressure chronic ulcer of other part of right foot with necrosis of muscle: Secondary | ICD-10-CM | POA: Diagnosis not present

## 2020-06-16 DIAGNOSIS — E538 Deficiency of other specified B group vitamins: Secondary | ICD-10-CM | POA: Diagnosis not present

## 2020-06-16 DIAGNOSIS — Z7901 Long term (current) use of anticoagulants: Secondary | ICD-10-CM | POA: Diagnosis not present

## 2020-06-16 DIAGNOSIS — L97512 Non-pressure chronic ulcer of other part of right foot with fat layer exposed: Secondary | ICD-10-CM | POA: Diagnosis not present

## 2020-06-16 DIAGNOSIS — N1831 Chronic kidney disease, stage 3a: Secondary | ICD-10-CM | POA: Diagnosis not present

## 2020-06-16 DIAGNOSIS — I70235 Atherosclerosis of native arteries of right leg with ulceration of other part of foot: Secondary | ICD-10-CM | POA: Diagnosis not present

## 2020-06-16 DIAGNOSIS — E559 Vitamin D deficiency, unspecified: Secondary | ICD-10-CM | POA: Diagnosis not present

## 2020-06-16 DIAGNOSIS — E7849 Other hyperlipidemia: Secondary | ICD-10-CM | POA: Diagnosis not present

## 2020-06-16 DIAGNOSIS — S62102D Fracture of unspecified carpal bone, left wrist, subsequent encounter for fracture with routine healing: Secondary | ICD-10-CM | POA: Diagnosis not present

## 2020-06-16 DIAGNOSIS — I739 Peripheral vascular disease, unspecified: Secondary | ICD-10-CM | POA: Diagnosis not present

## 2020-06-16 DIAGNOSIS — I272 Pulmonary hypertension, unspecified: Secondary | ICD-10-CM | POA: Diagnosis not present

## 2020-06-16 DIAGNOSIS — M810 Age-related osteoporosis without current pathological fracture: Secondary | ICD-10-CM | POA: Diagnosis not present

## 2020-06-16 DIAGNOSIS — G9009 Other idiopathic peripheral autonomic neuropathy: Secondary | ICD-10-CM | POA: Diagnosis not present

## 2020-06-16 DIAGNOSIS — I5032 Chronic diastolic (congestive) heart failure: Secondary | ICD-10-CM | POA: Diagnosis not present

## 2020-06-16 DIAGNOSIS — Z792 Long term (current) use of antibiotics: Secondary | ICD-10-CM | POA: Diagnosis not present

## 2020-06-16 DIAGNOSIS — M5136 Other intervertebral disc degeneration, lumbar region: Secondary | ICD-10-CM | POA: Diagnosis not present

## 2020-06-16 DIAGNOSIS — Z7982 Long term (current) use of aspirin: Secondary | ICD-10-CM | POA: Diagnosis not present

## 2020-06-16 DIAGNOSIS — G2581 Restless legs syndrome: Secondary | ICD-10-CM | POA: Diagnosis not present

## 2020-06-16 DIAGNOSIS — K219 Gastro-esophageal reflux disease without esophagitis: Secondary | ICD-10-CM | POA: Diagnosis not present

## 2020-06-16 DIAGNOSIS — M1990 Unspecified osteoarthritis, unspecified site: Secondary | ICD-10-CM | POA: Diagnosis not present

## 2020-06-16 DIAGNOSIS — I4891 Unspecified atrial fibrillation: Secondary | ICD-10-CM | POA: Diagnosis not present

## 2020-06-16 DIAGNOSIS — Z9181 History of falling: Secondary | ICD-10-CM | POA: Diagnosis not present

## 2020-06-16 DIAGNOSIS — I872 Venous insufficiency (chronic) (peripheral): Secondary | ICD-10-CM | POA: Diagnosis not present

## 2020-06-16 DIAGNOSIS — I130 Hypertensive heart and chronic kidney disease with heart failure and stage 1 through stage 4 chronic kidney disease, or unspecified chronic kidney disease: Secondary | ICD-10-CM | POA: Diagnosis not present

## 2020-06-16 NOTE — Progress Notes (Addendum)
SYVILLA, MARTIN ([id number]) Visit Report for 06/16/2020 Chief Complaint Document Details Patient Name: [name], [name]. Date of Service: 06/16/2020 10:15 AM Medical Record Number: [id number] Patient Account Number: [id number] Date of Birth/Sex: [birth year] (87 y.o. F) Treating RN: Dolan Amen Primary Care Provider: Ria Bush Other Clinician: Jeanine Luz Referring Provider: Ria Bush Treating Provider/Extender: Skipper Cliche in Treatment: 27 Information Obtained from: Patient Chief Complaint Right Lateral Foot Ulcer Electronic Signature(s) Signed: 06/16/2020 10:21:19 AM By: Worthy Keeler PA-C Entered By: Worthy Keeler on 06/16/2020 10:21:18 [name], [name] ([id number]) -------------------------------------------------------------------------------- Debridement Details Patient Name: [name] Date of Service: 06/16/2020 10:15 AM Medical Record Number: [id number] Patient Account Number: [id number] Date of Birth/Sex: 1930-01-05 (87 y.o. F) Treating RN: Carlene Coria Primary Care Provider: Ria Bush Other Clinician: Jeanine Luz Referring Provider: Ria Bush Treating Provider/Extender: Skipper Cliche in Treatment: 35 Debridement Performed for Wound #3 Right Metatarsal head fifth Assessment: Performed By: Physician Tommie Sams., PA-C Debridement Type: Debridement Severity of Tissue Pre Debridement: Fat layer exposed Level of Consciousness (Pre- Awake and Alert procedure): Pre-procedure Verification/Time Out Yes - 10:22 Taken: Start Time: 10:22 Pain Control: Lidocaine 4% Topical Solution Total Area Debrided (L x W): 0.1 (cm) x 0.1 (cm) = 0.01 (cm) Tissue and other material Viable, Non-Viable, Slough, Subcutaneous, Skin: Dermis , Skin: Epidermis, Slough debrided: Level: Skin/Subcutaneous Tissue Debridement Description: Excisional Instrument: Curette Bleeding: Minimum Hemostasis Achieved: Pressure End Time:  10:25 Procedural Pain: 0 Post Procedural Pain: 0 Response to Treatment: Procedure was tolerated well Level of Consciousness (Post- Awake and Alert procedure): Post Debridement Measurements of Total Wound Length: (cm) 0.1 Width: (cm) 0.1 Depth: (cm) 0.1 Volume: (cm) 0.001 Character of Wound/Ulcer Post Debridement: Improved Severity of Tissue Post Debridement: Fat layer exposed Post Procedure Diagnosis Same as Pre-procedure Electronic Signature(s) Signed: 06/16/2020 11:27:27 AM By: Worthy Keeler PA-C Signed: 06/20/2020 4:27:39 PM By: Carlene Coria RN Entered By: Carlene Coria on 06/16/2020 10:24:18 [name] ([id number]) -------------------------------------------------------------------------------- HPI Details Patient Name: [name]. Date of Service: 06/16/2020 10:15 AM Medical Record Number: [id number] Patient Account Number: [id number] Date of Birth/Sex: [birth year] (87 y.o. F) Treating RN: Dolan Amen Primary Care Provider: Ria Bush Other Clinician: Jeanine Luz Referring Provider: Ria Bush Treating Provider/Extender: Skipper Cliche in Treatment: 35 History of Present Illness HPI Description: 10/12/2019 upon evaluation today patient appears to be doing somewhat poorly in regard to her wound at this point. She has a ulcer over the right fifth metatarsal region. This has been managed by podiatry up to this point. Unfortunately the patient just does not seem to be making the progress that they were hoping for an subsequently the patient was referred to [place] by [facility] Dr. Posey Pronto. With that being said I did note that the patient does have peripheral vascular disease which has been managed by Dr. Lazaro Arms her most recent angiogram with intervention was on 08/02/2019. The ABIs following were on the right 1.30 with a TBI of 0.61 normal left 1.31 with a TBI of 0.58. Obviously things seem to be doing somewhat better at this point hopefully  enough to allow her to heal. She is on anticoagulant therapy due to atrial fibrillation long-term. With that being said the patient is unfortunately having quite a bit of discomfort at this time secondary to the wound. She also has erythema which is very likely secondary to infection. I am going obtain a culture of the area today once debridement is complete. Apparently the patient  also had a x-ray that was performed by Dr. Posey Pronto on 09/21/2019 and this showed that the patient had no obvious signs on x-ray of cortical irregularity and no osteomyelitis obviously noted. With that being said as I discussed with the patient's daughter as well this does not indicate that the patient absolutely has no osteomyelitis as x-rays are only at best 50% helpful in identifying this complication. Nonetheless I do believe that we need to have the patient continue to have this further evaluated specifically I am to recommend that we check into an MRI which I think would be ideal to evaluate whether or not there is osteomyelitis present. 7/16; this is a patient with an ulcer over the right fifth metatarsal head. She follows with Dr. Lucky Cowboy at [facility] She is due to have an MRI of the foot on 7/26. Using Santyl to the wound 11/04/2019 on evaluation today patient appears to be doing okay in regard to her wound at this time. Unfortunately since I last saw her which was actually her second visit here in the clinic she has moved into a new apartment and then subsequently had a fall. She ended up in the hospital she did fracture her left arm and subsequently has also been dealing with a lot of dizziness she has been at [employer]. She tells me that getting in the change the dressing has been somewhat difficult as far as daily dressing changes are concerned with the Santyl. Nonetheless when they do change it that is what is been used and her daughter-in-law has also been coming in to help out as well with the  dressing changes to try to make sure it gets done daily. Fortunately there is no signs of active infection at this time. No fever chills noted. I do believe the Santyl has been beneficial and is likely still the best way to go at this point for the patient. I did review the patient's MRI which showed some potential for reactive osteitis but did not show any signs of osteomyelitis overtly. That cannot be excluded but again right now it does not appear that is very likely present as well. This is good news. I also did review her culture she has been on the Bactrim she tells me she has taken this and again that was appropriate based on the results of her culture. 11/19/2019 upon evaluation today patient appears to be doing decently well in regard to her wound. Fortunately there is no signs of active infection at this time wound is not significantly larger in fact is roughly about the same at this point. With that being said there is not as much erythema as previously noted I think the infection seems to be under much better control which is great news. Unfortunately she has fallen since I last saw her and broken her left arm she has an appointment with them following the appointment with me. 8/25; patient was worked in acutely today at the request of home health and was concerned about her right heel, generalized erythema in her feet bilaterally. According to her daughter things have been getting worse over the last for 5 days but not precisely from a wound care point of view. Her wound is on the lateral part of the right fifth metatarsal head looks about the same as I remember this. She has developed dryness and flaking of the skin on her plantar feet. As well a very tender area localized over the insertion point of the Achilles tendon.  She has restless leg syndrome. Does not specifically offload the area at night. She wears socks at home and does not really give me a pressure on the right heel issue. I  reviewed her vascular status. She underwent her last revascularization on 08/02/2019 by Dr. Lucky Cowboy. This included an angioplasty of the right peroneal artery as well as the right posterior tibial artery. Her last noninvasive arterial study was on 11/23/2019. On the right her ABI was 1.08 TBI of 0.48. On the left ABI at 1.21 with a TBI of 0.54. Family relates that Dr. Lucky Cowboy felt that he had done the best he could and this. We have been using Santyl to the one wound she has on the right lateral fifth metatarsal head. She does not have a new wound today which is the concern that letter coming into the clinic 12/09/2019 on evaluation today patient appears to be doing decently well in regard to her foot ulcer at this point. She does note that with the compression wrap that the home health nurse put on after contacting our clinic that she actually did much better and felt much better. Her heel is also improved. Fortunately there is no signs of active infection at this time. No fevers, chills, nausea, vomiting, or diarrhea. 12/24/2019 upon evaluation today patient appears to be doing somewhat poorly still in regard to her foot. Unfortunately this is just not showing the signs of improvement that I would like to see it is a little bit cleaner but also appears to be erythematous and I believe there is infection noted at this point. We will get a need to address this in my opinion. I did actually recommend that we switch back to the Iodoflex as well apparently the home health nurse had switch this away that not really what we want to do at this point in my opinion. They were using silver alginate I think most recently. 01/06/2020 upon evaluation today patient actually appears to be doing much better in regard to her wound. We have switched back to the silver alginate after her home health nurse contacted me and honestly I feel like she is doing quite well in that regard. She does have a lot of drainage and therefore I  think that is probably a better option for. With that being said she is tolerating the compression wrap without complication that also seems to be helping. 01/28/2020 on evaluation today patient appears to be doing well with regard to her foot ulcer. I do feel like she is showing signs of improvement which is great news. There is no signs of active infection at this time which is also good news. No fevers, chills, nausea, vomiting, or diarrhea. Unfortunately she did have a fall and sustained an injury to the left upper arm. She has a skin tear here this appears to be doing okay although [name], [name]. ([id number]) the dressing was stuck to this this was an alginate dressing. I really think she probably needs something like Xeroform to try to prevent anything from sticking to this region. Other than that she seems to be doing quite well. 02/11/2020 on evaluation today patient's arm skin tear appears to be completely healed which is great. Unfortunately she is still having issues at this time with her foot although this seems to be showing signs of improvement it still very slow. There is no signs of active infection at this time. 03/06/2020 upon evaluation today patient appears to be doing decently well in regard to her  foot ulcer. There is some minimal slough noted on the surface of the wound building up at this point. Otherwise I really feel like she is doing quite nicely with quite a bit of new granulation and epithelial growth. 03/23/2020 upon evaluation today patient appears to be doing well with regards to her wound. She has been tolerating the dressing changes without complication. Fortunately there is no evidence of active infection and overall feel like her foot ulcer is doing excellent at this point. 04/13/2020 on evaluation today patient appears to be doing well with regard to her foot ulcer. She has been tolerating the dressing changes without complication. Fortunately there is no signs of  active infection at this time. Overall I feel like the wound is getting close to complete closure and this appears to be doing quite well in my opinion. 04/27/2020 on evaluation today patient appears to be doing excellent in regard to her wound. She has been tolerating the dressing changes without complication. Fortunately there is no signs of active infection and overall very pleased with where things stand today. In fact I feel like the wound is very close to complete resolution. 05/18/2020 upon evaluation today patient appears to be doing well with regard to her foot ulcer. There is no evidence of active infection at this time which is great news and overall very pleased with where things stand. No fevers, chills, nausea, vomiting, or diarrhea. I do feel like she is making great progress and I feel like that the wound is very tiny but again this last portion getting close has been somewhat difficult. 07/05/2020 upon evaluation today patient appears to be doing a little poorly in regard to her foot ulcer. Fortunately there is no signs of active infection at this time systemically though locally I feel it may be some cellulitis it looks like this wound may have prematurely sealed up subsequently causing a blister that had to be removed. The good news is that I do not think it is a very bad infection but I believe she may be experiencing cellulitis. 06/16/2020 upon evaluation today patient appears to be doing well with regard to her wound. It seems like it showed signs of improvement compared to last week where she had more infection noted. With that being said I do believe that based on what I am seeing the patient seems to be making excellent progress although I think still this is a small area that is going require some debridement to clear away some of the callus and drainage which is trapping fluid behind it based on what I see. Electronic Signature(s) Signed: 06/16/2020 10:35:58 AM By: Worthy Keeler PA-C Entered By: Worthy Keeler on 06/16/2020 10:35:57 [name] ([id number]) -------------------------------------------------------------------------------- Physical Exam Details Patient Name: [name], RAUS. Date of Service: 06/16/2020 10:15 AM Medical Record Number: [id number] Patient Account Number: [id number] Date of Birth/Sex: 1929/10/15 (87 y.o. F) Treating RN: Dolan Amen Primary Care Provider: Ria Bush Other Clinician: Jeanine Luz Referring Provider: Ria Bush Treating Provider/Extender: Skipper Cliche in Treatment: 10 Constitutional Well-nourished and well-hydrated in no acute distress. Respiratory normal breathing without difficulty. Psychiatric this patient is able to make decisions and demonstrates good insight into disease process. Alert and Oriented x 3. pleasant and cooperative. Notes Patient's wound did require sharp debridement there was a very small area that had to be debrided away in order to allow the fluid that was trapped underneath to come loose. For that reason I really think she needs to  be seen weekly to try to keep this under control so does not continue to Sila prematurely and cause complications for her. Electronic Signature(s) Signed: 06/16/2020 10:36:18 AM By: Worthy Keeler PA-C Entered By: Worthy Keeler on 06/16/2020 10:36:18 [name] ([id number]) -------------------------------------------------------------------------------- Physician Orders Details Patient Name: [name] Date of Service: 06/16/2020 10:15 AM Medical Record Number: [id number] Patient Account Number: [id number] Date of Birth/Sex: 1929/10/08 (87 y.o. F) Treating RN: Carlene Coria Primary Care Provider: Ria Bush Other Clinician: Jeanine Luz Referring Provider: Ria Bush Treating Provider/Extender: Skipper Cliche in Treatment: 65 Verbal / Phone Orders: No Diagnosis Coding ICD-10 Coding Code  Description I73.89 Other specified peripheral vascular diseases L97.512 Non-pressure chronic ulcer of other part of right foot with fat layer exposed S41.102A Unspecified open wound of left upper arm, initial encounter Z79.01 Long term (current) use of anticoagulants I48.0 Paroxysmal atrial fibrillation B35.3 Tinea pedis Follow-up Appointments Wound #3 Right Metatarsal head fifth o Return Appointment in 1 week. Home Health Wound #3 Right Metatarsal head fifth o [place]: - [place] for wound care. May utilize formulary equivalent dressing for wound treatment orders unless otherwise specified. Home Health Nurse may visit PRN to address patientos wound care needs. o Scheduled days for dressing changes to be completed; exception, patient has scheduled wound care visit that day. o **Please direct any NON-WOUND related issues/requests for orders to patient's Primary Care Physician. **If current dressing causes regression in wound condition, may D/C ordered dressing product/s and apply Normal Saline Moist Dressing daily until next [place] or Other MD appointment. **Notify [facility] of regression in wound condition at [phone]. Medications-Please add to medication list. Wound #3 Right Metatarsal head fifth o P.O. Antibiotics - Bactrim Wound Treatment Wound #3 - Metatarsal head fifth Wound Laterality: Right Cleanser: Normal Saline (Generic) 2 x Per Week/30 Days Discharge Instructions: Wash your hands with soap and water. Remove old dressing, discard into plastic bag and place into trash. Cleanse the wound with Normal Saline prior to applying a clean dressing using gauze sponges, not tissues or cotton balls. Do not scrub or use excessive force. Pat dry using gauze sponges, not tissue or cotton balls. Primary Dressing: Silvercel Small 2x2 (in/in) 2 x Per Week/30 Days Discharge Instructions: Apply Silvercel Small 2x2 (in/in)  as instructed Secondary Dressing: Mepilex Border Flex, 4x4 (in/in) (Generic) 2 x Per Week/30 Days Discharge Instructions: Apply to wound as directed. Do not cut. Electronic Signature(s) Signed: 06/16/2020 11:27:27 AM By: Worthy Keeler PA-C Signed: 06/20/2020 4:27:39 PM By: Carlene Coria RN Entered By: Carlene Coria on 06/16/2020 10:25:08 [name] ([id number]) -------------------------------------------------------------------------------- Problem List Details Patient Name: [name], [name]. Date of Service: 06/16/2020 10:15 AM Medical Record Number: [id number] Patient Account Number: [id number] Date of Birth/Sex: [birth year] (87 y.o. F) Treating RN: Dolan Amen Primary Care Provider: Ria Bush Other Clinician: Jeanine Luz Referring Provider: Ria Bush Treating Provider/Extender: Skipper Cliche in Treatment: 35 Active Problems ICD-10 Encounter Code Description Active Date MDM Diagnosis I73.89 Other specified peripheral vascular diseases 10/12/2019 No Yes L97.512 Non-pressure chronic ulcer of other part of right foot with fat layer 10/12/2019 No Yes exposed S41.102A Unspecified open wound of left upper arm, initial encounter 01/28/2020 No Yes Z79.01 Long term (current) use of anticoagulants 10/12/2019 No Yes I48.0 Paroxysmal atrial fibrillation 10/12/2019 No Yes B35.3 Tinea pedis 12/01/2019 No Yes Inactive Problems Resolved Problems Electronic Signature(s) Signed: 06/16/2020 10:21:13 AM By: Worthy Keeler PA-C Entered By: Melburn Hake,  Mikyle Sox on 06/16/2020 10:21:13 [name], [name] ([id number]) -------------------------------------------------------------------------------- Progress Note Details Patient Name: [name], [name]. Date of Service: 06/16/2020 10:15 AM Medical Record Number: [id number] Patient Account Number: [id number] Date of Birth/Sex: 1929/11/11 (87 y.o. F) Treating RN: Dolan Amen Primary Care Provider: Ria Bush Other Clinician:  Jeanine Luz Referring Provider: Ria Bush Treating Provider/Extender: Skipper Cliche in Treatment: 65 Subjective Chief Complaint Information obtained from Patient Right Lateral Foot Ulcer History of Present Illness (HPI) 10/12/2019 upon evaluation today patient appears to be doing somewhat poorly in regard to her wound at this point. She has a ulcer over the right fifth metatarsal region. This has been managed by podiatry up to this point. Unfortunately the patient just does not seem to be making the progress that they were hoping for an subsequently the patient was referred to [place] by [facility] Dr. Posey Pronto. With that being said I did note that the patient does have peripheral vascular disease which has been managed by Dr. Lazaro Arms her most recent angiogram with intervention was on 08/02/2019. The ABIs following were on the right 1.30 with a TBI of 0.61 normal left 1.31 with a TBI of 0.58. Obviously things seem to be doing somewhat better at this point hopefully enough to allow her to heal. She is on anticoagulant therapy due to atrial fibrillation long-term. With that being said the patient is unfortunately having quite a bit of discomfort at this time secondary to the wound. She also has erythema which is very likely secondary to infection. I am going obtain a culture of the area today once debridement is complete. Apparently the patient also had a x-ray that was performed by Dr. Posey Pronto on 09/21/2019 and this showed that the patient had no obvious signs on x-ray of cortical irregularity and no osteomyelitis obviously noted. With that being said as I discussed with the patient's daughter as well this does not indicate that the patient absolutely has no osteomyelitis as x-rays are only at best 50% helpful in identifying this complication. Nonetheless I do believe that we need to have the patient continue to have this further evaluated specifically I am to recommend that we check  into an MRI which I think would be ideal to evaluate whether or not there is osteomyelitis present. 7/16; this is a patient with an ulcer over the right fifth metatarsal head. She follows with Dr. Lucky Cowboy at [facility]. She is due to have an MRI of the foot on 7/26. Using Santyl to the wound 11/04/2019 on evaluation today patient appears to be doing okay in regard to her wound at this time. Unfortunately since I last saw her which was actually her second visit here in the clinic she has moved into a new apartment and then subsequently had a fall. She ended up in the hospital she did fracture her left arm and subsequently has also been dealing with a lot of dizziness she has been at [employer]. She tells me that getting in the change the dressing has been somewhat difficult as far as daily dressing changes are concerned with the Santyl. Nonetheless when they do change it that is what is been used and her daughter-in-law has also been coming in to help out as well with the dressing changes to try to make sure it gets done daily. Fortunately there is no signs of active infection at this time. No fever chills noted. I do believe the Santyl has been beneficial and is likely still the  best way to go at this point for the patient. I did review the patient's MRI which showed some potential for reactive osteitis but did not show any signs of osteomyelitis overtly. That cannot be excluded but again right now it does not appear that is very likely present as well. This is good news. I also did review her culture she has been on the Bactrim she tells me she has taken this and again that was appropriate based on the results of her culture. 11/19/2019 upon evaluation today patient appears to be doing decently well in regard to her wound. Fortunately there is no signs of active infection at this time wound is not significantly larger in fact is roughly about the same at this point. With that being said  there is not as much erythema as previously noted I think the infection seems to be under much better control which is great news. Unfortunately she has fallen since I last saw her and broken her left arm she has an appointment with them following the appointment with me. 8/25; patient was worked in acutely today at the request of home health and was concerned about her right heel, generalized erythema in her feet bilaterally. According to her daughter things have been getting worse over the last for 5 days but not precisely from a wound care point of view. Her wound is on the lateral part of the right fifth metatarsal head looks about the same as I remember this. She has developed dryness and flaking of the skin on her plantar feet. As well a very tender area localized over the insertion point of the Achilles tendon. She has restless leg syndrome. Does not specifically offload the area at night. She wears socks at home and does not really give me a pressure on the right heel issue. I reviewed her vascular status. She underwent her last revascularization on 08/02/2019 by Dr. Lucky Cowboy. This included an angioplasty of the right peroneal artery as well as the right posterior tibial artery. Her last noninvasive arterial study was on 11/23/2019. On the right her ABI was 1.08 TBI of 0.48. On the left ABI at 1.21 with a TBI of 0.54. Family relates that Dr. Lucky Cowboy felt that he had done the best he could and this. We have been using Santyl to the one wound she has on the right lateral fifth metatarsal head. She does not have a new wound today which is the concern that letter coming into the clinic 12/09/2019 on evaluation today patient appears to be doing decently well in regard to her foot ulcer at this point. She does note that with the compression wrap that the home health nurse put on after contacting our clinic that she actually did much better and felt much better. Her heel is also improved. Fortunately there is no  signs of active infection at this time. No fevers, chills, nausea, vomiting, or diarrhea. 12/24/2019 upon evaluation today patient appears to be doing somewhat poorly still in regard to her foot. Unfortunately this is just not showing the signs of improvement that I would like to see it is a little bit cleaner but also appears to be erythematous and I believe there is infection noted at this point. We will get a need to address this in my opinion. I did actually recommend that we switch back to the Iodoflex as well apparently the home health nurse had switch this away that not really what we want to do at this point in my  opinion. They were using silver alginate I think most recently. 01/06/2020 upon evaluation today patient actually appears to be doing much better in regard to her wound. We have switched back to the silver alginate after her home health nurse contacted me and honestly I feel like she is doing quite well in that regard. She does have a lot of drainage and therefore I think that is probably a better option for. With that being said she is tolerating the compression wrap without complication that [name], [name]. ([id number]) also seems to be helping. 01/28/2020 on evaluation today patient appears to be doing well with regard to her foot ulcer. I do feel like she is showing signs of improvement which is great news. There is no signs of active infection at this time which is also good news. No fevers, chills, nausea, vomiting, or diarrhea. Unfortunately she did have a fall and sustained an injury to the left upper arm. She has a skin tear here this appears to be doing okay although the dressing was stuck to this this was an alginate dressing. I really think she probably needs something like Xeroform to try to prevent anything from sticking to this region. Other than that she seems to be doing quite well. 02/11/2020 on evaluation today patient's arm skin tear appears to be completely healed  which is great. Unfortunately she is still having issues at this time with her foot although this seems to be showing signs of improvement it still very slow. There is no signs of active infection at this time. 03/06/2020 upon evaluation today patient appears to be doing decently well in regard to her foot ulcer. There is some minimal slough noted on the surface of the wound building up at this point. Otherwise I really feel like she is doing quite nicely with quite a bit of new granulation and epithelial growth. 03/23/2020 upon evaluation today patient appears to be doing well with regards to her wound. She has been tolerating the dressing changes without complication. Fortunately there is no evidence of active infection and overall feel like her foot ulcer is doing excellent at this point. 04/13/2020 on evaluation today patient appears to be doing well with regard to her foot ulcer. She has been tolerating the dressing changes without complication. Fortunately there is no signs of active infection at this time. Overall I feel like the wound is getting close to complete closure and this appears to be doing quite well in my opinion. 04/27/2020 on evaluation today patient appears to be doing excellent in regard to her wound. She has been tolerating the dressing changes without complication. Fortunately there is no signs of active infection and overall very pleased with where things stand today. In fact I feel like the wound is very close to complete resolution. 05/18/2020 upon evaluation today patient appears to be doing well with regard to her foot ulcer. There is no evidence of active infection at this time which is great news and overall very pleased with where things stand. No fevers, chills, nausea, vomiting, or diarrhea. I do feel like she is making great progress and I feel like that the wound is very tiny but again this last portion getting close has been somewhat difficult. 07/05/2020 upon  evaluation today patient appears to be doing a little poorly in regard to her foot ulcer. Fortunately there is no signs of active infection at this time systemically though locally I feel it may be some cellulitis it looks like this wound may  have prematurely sealed up subsequently causing a blister that had to be removed. The good news is that I do not think it is a very bad infection but I believe she may be experiencing cellulitis. 06/16/2020 upon evaluation today patient appears to be doing well with regard to her wound. It seems like it showed signs of improvement compared to last week where she had more infection noted. With that being said I do believe that based on what I am seeing the patient seems to be making excellent progress although I think still this is a small area that is going require some debridement to clear away some of the callus and drainage which is trapping fluid behind it based on what I see. Objective Constitutional Well-nourished and well-hydrated in no acute distress. Vitals Time Taken: 10:10 AM, Height: 62 in, Weight: 140 lbs, BMI: 25.6, Temperature: 97.9 F, Pulse: 65 bpm, Respiratory Rate: 20 breaths/min, Blood Pressure: 164/79 mmHg. Respiratory normal breathing without difficulty. Psychiatric this patient is able to make decisions and demonstrates good insight into disease process. Alert and Oriented x 3. pleasant and cooperative. General Notes: Patient's wound did require sharp debridement there was a very small area that had to be debrided away in order to allow the fluid that was trapped underneath to come loose. For that reason I really think she needs to be seen weekly to try to keep this under control so does not continue to Sila prematurely and cause complications for her. Integumentary (Hair, Skin) Wound #3 status is Open. Original cause of wound was Gradually Appeared. The date acquired was: 04/09/2019. The wound has been in treatment 35 weeks. The wound  is located on the Right Metatarsal head fifth. The wound measures 0.1cm length x 0.1cm width x 0.1cm depth; 0.008cm^2 area and 0.001cm^3 volume. There is Fat Layer (Subcutaneous Tissue) exposed. There is no tunneling or undermining noted. There is a none present amount of drainage noted. The wound margin is distinct with the outline attached to the wound base. There is small (1-33%) pink granulation within the wound bed. There is a small (1-33%) amount of necrotic tissue within the wound bed including Adherent Slough. [name], [name] ([id number]) Assessment Active Problems ICD-10 Other specified peripheral vascular diseases Non-pressure chronic ulcer of other part of right foot with fat layer exposed Unspecified open wound of left upper arm, initial encounter Long term (current) use of anticoagulants Paroxysmal atrial fibrillation Tinea pedis Procedures Wound #3 Pre-procedure diagnosis of Wound #3 is an Arterial Insufficiency Ulcer located on the Right Metatarsal head fifth .Severity of Tissue Pre Debridement is: Fat layer exposed. There was a Excisional Skin/Subcutaneous Tissue Debridement with a total area of 0.01 sq cm performed by Tommie Sams., PA-C. With the following instrument(s): Curette to remove Viable and Non-Viable tissue/material. Material removed includes Subcutaneous Tissue, Slough, Skin: Dermis, and Skin: Epidermis after achieving pain control using Lidocaine 4% Topical Solution. No specimens were taken. A time out was conducted at 10:22, prior to the start of the procedure. A Minimum amount of bleeding was controlled with Pressure. The procedure was tolerated well with a pain level of 0 throughout and a pain level of 0 following the procedure. Post Debridement Measurements: 0.1cm length x 0.1cm width x 0.1cm depth; 0.001cm^3 volume. Character of Wound/Ulcer Post Debridement is improved. Severity of Tissue Post Debridement is: Fat layer exposed. Post procedure Diagnosis  Wound #3: Same as Pre-Procedure Plan Follow-up Appointments: Wound #3 Right Metatarsal head fifth: Return Appointment in 1 week. Home Health:  Wound #3 Right Metatarsal head fifth: [place]: - [place] for wound care. May utilize formulary equivalent dressing for wound treatment orders unless otherwise specified. Home Health Nurse may visit [facility] to address patient s wound care needs. Scheduled days for dressing changes to be completed; exception, patient has scheduled wound care visit that day. **Please direct any NON-WOUND related issues/requests for orders to patient's Primary Care Physician. **If current dressing causes regression in wound condition, may D/C ordered dressing product/s and apply Normal Saline Moist Dressing daily until next [place] or Other MD appointment. **Notify [facility] of regression in wound condition at [phone]. Medications-Please add to medication list.: Wound #3 Right Metatarsal head fifth: P.O. Antibiotics - Bactrim WOUND #3: - Metatarsal head fifth Wound Laterality: Right Cleanser: Normal Saline (Generic) 2 x Per Week/30 Days Discharge Instructions: Wash your hands with soap and water. Remove old dressing, discard into plastic bag and place into trash. Cleanse the wound with Normal Saline prior to applying a clean dressing using gauze sponges, not tissues or cotton balls. Do not scrub or use excessive force. Pat dry using gauze sponges, not tissue or cotton balls. Primary Dressing: Silvercel Small 2x2 (in/in) 2 x Per Week/30 Days Discharge Instructions: Apply Silvercel Small 2x2 (in/in) as instructed Secondary Dressing: Mepilex Border Flex, 4x4 (in/in) (Generic) 2 x Per Week/30 Days Discharge Instructions: Apply to wound as directed. Do not cut. 1. Would recommend currently we continue with the silver alginate dressing I think that still a good option for the patient this needs to be packed into the  wound region. 2. I am also can recommend that we continue to cover this with a border foam dressing for protection. 3. I would also recommend that she continue to monitor for any signs of worsening if anything occurs they should let me know soon as possible. We will see patient back for reevaluation in 1 week here in the clinic. If anything worsens or changes patient will contact our office for additional recommendations. Electronic Signature(s) [name], [name] ([id number]) Signed: 06/16/2020 10:37:13 AM By: Worthy Keeler PA-C Entered By: Worthy Keeler on 06/16/2020 10:37:12 [name], [name] ([id number]) -------------------------------------------------------------------------------- SuperBill Details Patient Name: [name] Date of Service: 06/16/2020 Medical Record Number: [id number] Patient Account Number: [id number] Date of Birth/Sex: November 07, 1929 (87 y.o. F) Treating RN: Dolan Amen Primary Care Provider: Ria Bush Other Clinician: Jeanine Luz Referring Provider: Ria Bush Treating Provider/Extender: Skipper Cliche in Treatment: 35 Diagnosis Coding ICD-10 Codes Code Description I73.89 Other specified peripheral vascular diseases L97.512 Non-pressure chronic ulcer of other part of right foot with fat layer exposed S41.102A Unspecified open wound of left upper arm, initial encounter Z79.01 Long term (current) use of anticoagulants I48.0 Paroxysmal atrial fibrillation B35.3 Tinea pedis Facility Procedures CPT4 Code: 03474259 Description: 56387 - DEB SUBQ TISSUE 20 SQ CM/< Modifier: Quantity: 1 CPT4 Code: Description: ICD-10 Diagnosis Description L97.512 Non-pressure chronic ulcer of other part of right foot with fat layer ex Modifier: posed Quantity: Physician Procedures CPT4 Code: 5643329 Description: 11042 - WC PHYS SUBQ TISS 20 SQ CM Modifier: Quantity: 1 CPT4 Code: Description: ICD-10 Diagnosis Description L97.512 Non-pressure  chronic ulcer of other part of right foot with fat layer ex Modifier: posed Quantity: Electronic Signature(s) Signed: 06/16/2020 10:37:21 AM By: Worthy Keeler PA-C Entered By: Worthy Keeler on 06/16/2020 10:37:21

## 2020-06-19 DIAGNOSIS — G9009 Other idiopathic peripheral autonomic neuropathy: Secondary | ICD-10-CM | POA: Diagnosis not present

## 2020-06-19 DIAGNOSIS — L97513 Non-pressure chronic ulcer of other part of right foot with necrosis of muscle: Secondary | ICD-10-CM | POA: Diagnosis not present

## 2020-06-19 DIAGNOSIS — G2581 Restless legs syndrome: Secondary | ICD-10-CM | POA: Diagnosis not present

## 2020-06-19 DIAGNOSIS — E538 Deficiency of other specified B group vitamins: Secondary | ICD-10-CM | POA: Diagnosis not present

## 2020-06-19 DIAGNOSIS — N1831 Chronic kidney disease, stage 3a: Secondary | ICD-10-CM | POA: Diagnosis not present

## 2020-06-19 DIAGNOSIS — I272 Pulmonary hypertension, unspecified: Secondary | ICD-10-CM | POA: Diagnosis not present

## 2020-06-19 DIAGNOSIS — Z792 Long term (current) use of antibiotics: Secondary | ICD-10-CM | POA: Diagnosis not present

## 2020-06-19 DIAGNOSIS — E559 Vitamin D deficiency, unspecified: Secondary | ICD-10-CM | POA: Diagnosis not present

## 2020-06-19 DIAGNOSIS — K219 Gastro-esophageal reflux disease without esophagitis: Secondary | ICD-10-CM | POA: Diagnosis not present

## 2020-06-19 DIAGNOSIS — M810 Age-related osteoporosis without current pathological fracture: Secondary | ICD-10-CM | POA: Diagnosis not present

## 2020-06-19 DIAGNOSIS — I4891 Unspecified atrial fibrillation: Secondary | ICD-10-CM | POA: Diagnosis not present

## 2020-06-19 DIAGNOSIS — Z9181 History of falling: Secondary | ICD-10-CM | POA: Diagnosis not present

## 2020-06-19 DIAGNOSIS — S62102D Fracture of unspecified carpal bone, left wrist, subsequent encounter for fracture with routine healing: Secondary | ICD-10-CM | POA: Diagnosis not present

## 2020-06-19 DIAGNOSIS — I872 Venous insufficiency (chronic) (peripheral): Secondary | ICD-10-CM | POA: Diagnosis not present

## 2020-06-19 DIAGNOSIS — Z7901 Long term (current) use of anticoagulants: Secondary | ICD-10-CM | POA: Diagnosis not present

## 2020-06-19 DIAGNOSIS — I130 Hypertensive heart and chronic kidney disease with heart failure and stage 1 through stage 4 chronic kidney disease, or unspecified chronic kidney disease: Secondary | ICD-10-CM | POA: Diagnosis not present

## 2020-06-19 DIAGNOSIS — I5032 Chronic diastolic (congestive) heart failure: Secondary | ICD-10-CM | POA: Diagnosis not present

## 2020-06-19 DIAGNOSIS — M1990 Unspecified osteoarthritis, unspecified site: Secondary | ICD-10-CM | POA: Diagnosis not present

## 2020-06-19 DIAGNOSIS — Z7982 Long term (current) use of aspirin: Secondary | ICD-10-CM | POA: Diagnosis not present

## 2020-06-19 DIAGNOSIS — E7849 Other hyperlipidemia: Secondary | ICD-10-CM | POA: Diagnosis not present

## 2020-06-19 DIAGNOSIS — M5136 Other intervertebral disc degeneration, lumbar region: Secondary | ICD-10-CM | POA: Diagnosis not present

## 2020-06-19 DIAGNOSIS — I2510 Atherosclerotic heart disease of native coronary artery without angina pectoris: Secondary | ICD-10-CM | POA: Diagnosis not present

## 2020-06-20 NOTE — Progress Notes (Signed)
MARRISA, KIMBER ([id number]) Visit Report for 06/16/2020 Arrival Information Details Patient Name: [name], [name]. Date of Service: 06/16/2020 10:15 AM Medical Record Number: [id number] Patient Account Number: [id number] Date of Birth/Sex: [birth year] (87 y.o. F) Treating RN: Dolan Amen Primary Care Dima Mini: Ria Bush Other Clinician: Jeanine Luz Referring Nathan Moctezuma: Ria Bush Treating Amando Chaput/Extender: Skipper Cliche in Treatment: 40 Visit Information History Since Last Visit Added or deleted any medications: No Patient Arrived: [name] Had a fall or experienced change in Yes Arrival Time: 10:08 activities of daily living that may affect Accompanied By: daughter risk of falls: Transfer Assistance: None Hospitalized since last visit: No Patient Identification Verified: Yes Pain Present Now: No Secondary Verification Process Completed: Yes Patient Requires Transmission-Based No Precautions: Patient Has Alerts: Yes Patient Alerts: 08/31/19 ABI R)1.3 L) 1.31 08/31/19 TBI R).61 L).58 Electronic Signature(s) Signed: 06/19/2020 11:47:12 AM By: Jeanine Luz Entered By: Jeanine Luz on 06/16/2020 10:10:55 [name] ([id number]) -------------------------------------------------------------------------------- Clinic Level of Care Assessment Details Patient Name: [name] Date of Service: 06/16/2020 10:15 AM Medical Record Number: [id number] Patient Account Number: [id number] Date of Birth/Sex: July 22, 1929 (87 y.o. F) Treating RN: Carlene Coria Primary Care Wilberta Dorvil: Ria Bush Other Clinician: Jeanine Luz Referring Averill Pons: Ria Bush Treating Doryce Mcgregory/Extender: Skipper Cliche in Treatment: 35 Clinic Level of Care Assessment Items TOOL 1 Quantity Score []  - Use when EandM and Procedure is performed on INITIAL visit 0 ASSESSMENTS - Nursing Assessment / Reassessment []  - General Physical Exam (combine w/  comprehensive assessment (listed just below) when performed on new 0 pt. evals) []  - 0 Comprehensive Assessment (HX, ROS, Risk Assessments, Wounds Hx, etc.) ASSESSMENTS - Wound and Skin Assessment / Reassessment []  - Dermatologic / Skin Assessment (not related to wound area) 0 ASSESSMENTS - Ostomy and/or Continence Assessment and Care []  - Incontinence Assessment and Management 0 []  - 0 Ostomy Care Assessment and Management (repouching, etc.) PROCESS - Coordination of Care []  - Simple Patient / Family Education for ongoing care 0 []  - 0 Complex (extensive) Patient / Family Education for ongoing care []  - 0 Staff obtains Programmer, systems, Records, Test Results / Process Orders []  - 0 Staff telephones HHA, Nursing Homes / Clarify orders / etc []  - 0 Routine Transfer to another Facility (non-emergent condition) []  - 0 Routine Hospital Admission (non-emergent condition) []  - 0 New Admissions / Biomedical engineer / Ordering NPWT, Apligraf, etc. []  - 0 Emergency Hospital Admission (emergent condition) PROCESS - Special Needs []  - Pediatric / Minor Patient Management 0 []  - 0 Isolation Patient Management []  - 0 Hearing / Language / Visual special needs []  - 0 Assessment of Community assistance (transportation, D/C planning, etc.) []  - 0 Additional assistance / Altered mentation []  - 0 Support Surface(s) Assessment (bed, cushion, seat, etc.) INTERVENTIONS - Miscellaneous []  - External ear exam 0 []  - 0 Patient Transfer (multiple staff / Civil Service fast streamer / Similar devices) []  - 0 Simple Staple / Suture removal (25 or less) []  - 0 Complex Staple / Suture removal (26 or more) []  - 0 Hypo/Hyperglycemic Management (do not check if billed separately) []  - 0 Ankle / Brachial Index (ABI) - do not check if billed separately Has the patient been seen at the hospital within the last three years: Yes Total Score: 0 Level Of Care: ____ [name] ([id number]) Electronic  Signature(s) Signed: 06/20/2020 4:27:39 PM By: Carlene Coria RN Entered By: Carlene Coria on 06/16/2020 10:25:20 [name] ([id number]) -------------------------------------------------------------------------------- Encounter Discharge Information Details Patient Name: [name],  Crystal S. Date of Service: 06/16/2020 10:15 AM Medical Record Number: [id number] Patient Account Number: [id number] Date of Birth/Sex: 03-24-1930 (87 y.o. F) Treating RN: Carlene Coria Primary Care Kennis Buell: Ria Bush Other Clinician: Jeanine Luz Referring Mardella Nuckles: Ria Bush Treating Ash Mcelwain/Extender: Skipper Cliche in Treatment: 35 Encounter Discharge Information Items Post Procedure Vitals Discharge Condition: Stable Temperature (F): 97.9 Ambulatory Status: Walker Pulse (bpm): 65 Discharge Destination: Home Respiratory Rate (breaths/min): 20 Transportation: Private Auto Blood Pressure (mmHg): 164/79 Accompanied By: friend Schedule Follow-up Appointment: Yes Clinical Summary of Care: Patient Declined Electronic Signature(s) Signed: 06/20/2020 4:27:39 PM By: Carlene Coria RN Entered By: Carlene Coria on 06/16/2020 11:31:19 [name] ([id number]) -------------------------------------------------------------------------------- Lower Extremity Assessment Details Patient Name: [name]. Date of Service: 06/16/2020 10:15 AM Medical Record Number: [id number] Patient Account Number: [id number] Date of Birth/Sex: 1929-10-26 (87 y.o. F) Treating RN: Dolan Amen Primary Care Buffie Herne: Ria Bush Other Clinician: Jeanine Luz Referring Kiegan Macaraeg: Ria Bush Treating Ameriah Lint/Extender: Jeri Cos Weeks in Treatment: 35 Electronic Signature(s) Signed: 06/19/2020 11:47:12 AM By: Jeanine Luz Signed: 06/19/2020 4:55:56 PM By: Georges Mouse, Minus Breeding RN Entered By: Jeanine Luz on 06/16/2020 10:17:57 [name]  ([id number]) -------------------------------------------------------------------------------- Multi Wound Chart Details Patient Name: [name], [name]. Date of Service: 06/16/2020 10:15 AM Medical Record Number: [id number] Patient Account Number: [id number] Date of Birth/Sex: [birth year] (87 y.o. F) Treating RN: Carlene Coria Primary Care Kyannah Climer: Ria Bush Other Clinician: Jeanine Luz Referring Yanin Muhlestein: Ria Bush Treating Jenene Kauffmann/Extender: Skipper Cliche in Treatment: 35 Vital Signs Height(in): 62 Pulse(bpm): 65 Weight(lbs): 140 Blood Pressure(mmHg): 164/79 Body Mass Index(BMI): 26 Temperature(F): 97.9 Respiratory Rate(breaths/min): 20 Photos: [N/A:N/A] Wound Location: Right Metatarsal head fifth N/A N/A Wounding Event: Gradually Appeared N/A N/A Primary Etiology: Arterial Insufficiency Ulcer N/A N/A Comorbid History: Arrhythmia, Osteoarthritis N/A N/A Date Acquired: 04/09/2019 N/A N/A Weeks of Treatment: 35 N/A N/A Wound Status: Open N/A N/A Measurements L x W x D (cm) 0.1x0.1x0.1 N/A N/A Area (cm) : 0.008 N/A N/A Volume (cm) : 0.001 N/A N/A % Reduction in Area: 99.30% N/A N/A % Reduction in Volume: 99.10% N/A N/A Classification: Full Thickness Without Exposed N/A N/A Support Structures Exudate Amount: None Present N/A N/A Wound Margin: Distinct, outline attached N/A N/A Granulation Amount: Small (1-33%) N/A N/A Granulation Quality: Pink N/A N/A Necrotic Amount: Small (1-33%) N/A N/A Exposed Structures: Fat Layer (Subcutaneous Tissue): N/A N/A Yes Fascia: No Tendon: No Muscle: No Joint: No Bone: No Epithelialization: Small (1-33%) N/A N/A Treatment Notes Electronic Signature(s) Signed: 06/20/2020 4:27:39 PM By: Carlene Coria RN Entered By: Carlene Coria on 06/16/2020 10:23:06 [name] ([id number]) -------------------------------------------------------------------------------- [place] Details Patient Name:  [name]. Date of Service: 06/16/2020 10:15 AM Medical Record Number: [id number] Patient Account Number: [id number] Date of Birth/Sex: Nov 10, 1929 (87 y.o. F) Treating RN: Carlene Coria Primary Care Carnella Fryman: Ria Bush Other Clinician: Jeanine Luz Referring Karas Pickerill: Ria Bush Treating Mykira Hofmeister/Extender: Skipper Cliche in Treatment: 72 Active Inactive Abuse / Safety / Falls / Self Care Management Nursing Diagnoses: Potential for falls Goals: Patient/caregiver will verbalize understanding of skin care regimen Date Initiated: 10/12/2019 Target Resolution Date: 07/10/2020 Goal Status: Active Interventions: Assess fall risk on admission and as needed Notes: Electronic Signature(s) Signed: 06/20/2020 4:27:39 PM By: Carlene Coria RN Entered By: Carlene Coria on 06/16/2020 10:22:56 [name] ([id number]) -------------------------------------------------------------------------------- Pain Assessment Details Patient Name: [name]. Date of Service: 06/16/2020 10:15 AM Medical Record Number: [id number] Patient Account Number: [id number] Date of Birth/Sex: [birth year] (87 y.o. F) Treating RN: Laurin Coder,  Minus Breeding Primary Care Kamaron Deskins: Ria Bush Other Clinician: Jeanine Luz Referring Ronnel Zuercher: Ria Bush Treating Tammye Kahler/Extender: Skipper Cliche in Treatment: 35 Active Problems Location of Pain Severity and Description of Pain Patient Has Paino No Site Locations Pain Management and Medication Current Pain Management: Electronic Signature(s) Signed: 06/19/2020 11:47:12 AM By: Jeanine Luz Signed: 06/19/2020 4:55:56 PM By: Georges Mouse, Minus Breeding RN Entered By: Jeanine Luz on 06/16/2020 10:12:47 [name] ([id number]) -------------------------------------------------------------------------------- Patient/Caregiver Education Details Patient Name: [name] Date of Service: 06/16/2020 10:15 AM Medical Record  Number: [id number] Patient Account Number: [id number] Date of Birth/Gender: February 04, 1930 (87 y.o. F) Treating RN: Carlene Coria Primary Care Physician: Ria Bush Other Clinician: Jeanine Luz Referring Physician: Ria Bush Treating Physician/Extender: Skipper Cliche in Treatment: 23 Education Assessment Education Provided To: Patient Education Topics Provided Wound/Skin Impairment: Methods: Explain/Verbal Responses: State content correctly Electronic Signature(s) Signed: 06/20/2020 4:27:39 PM By: Carlene Coria RN Entered By: Carlene Coria on 06/16/2020 10:25:37 [name] ([id number]) -------------------------------------------------------------------------------- Wound Assessment Details Patient Name: [name] Date of Service: 06/16/2020 10:15 AM Medical Record Number: [id number] Patient Account Number: [id number] Date of Birth/Sex: [birth year] (87 y.o. F) Treating RN: Dolan Amen Primary Care Nicoletta Hush: Ria Bush Other Clinician: Jeanine Luz Referring Ethelwyn Gilbertson: Ria Bush Treating Witt Plitt/Extender: Jeri Cos Weeks in Treatment: 35 Wound Status Wound Number: 3 Primary Etiology: Arterial Insufficiency Ulcer Wound Location: Right Metatarsal head fifth Wound Status: Open Wounding Event: Gradually Appeared Comorbid History: Arrhythmia, Osteoarthritis Date Acquired: 04/09/2019 Weeks Of Treatment: 35 Clustered Wound: No Photos Wound Measurements Length: (cm) 0.1 Width: (cm) 0.1 Depth: (cm) 0.1 Area: (cm) 0.008 Volume: (cm) 0.001 % Reduction in Area: 99.3% % Reduction in Volume: 99.1% Epithelialization: Small (1-33%) Tunneling: No Undermining: No Wound Description Classification: Full Thickness Without Exposed Support Structures Wound Margin: Distinct, outline attached Exudate Amount: None Present Foul Odor After Cleansing: No Slough/Fibrino Yes Wound Bed Granulation Amount: Small (1-33%) Exposed  Structure Granulation Quality: Pink Fascia Exposed: No Necrotic Amount: Small (1-33%) Fat Layer (Subcutaneous Tissue) Exposed: Yes Necrotic Quality: Adherent Slough Tendon Exposed: No Muscle Exposed: No Joint Exposed: No Bone Exposed: No Treatment Notes Wound #3 (Metatarsal head fifth) Wound Laterality: Right Cleanser Normal Saline Discharge Instruction: Wash your hands with soap and water. Remove old dressing, discard into plastic bag and place into trash. Cleanse the wound with Normal Saline prior to applying a clean dressing using gauze sponges, not tissues or cotton balls. Do not scrub or use excessive force. Pat dry using gauze sponges, not tissue or cotton balls. Peri-Wound Care [name], [name] ([id number]) Topical Primary Dressing Silvercel Small 2x2 (in/in) Discharge Instruction: Apply Silvercel Small 2x2 (in/in) as instructed Secondary Dressing Mepilex Border Flex, 4x4 (in/in) Discharge Instruction: Apply to wound as directed. Do not cut. Secured With Compression Wrap Compression Stockings Add-Ons Electronic Signature(s) Signed: 06/19/2020 11:47:12 AM By: Jeanine Luz Signed: 06/19/2020 4:55:56 PM By: Georges Mouse, Minus Breeding RN Entered By: Jeanine Luz on 06/16/2020 10:17:45 [name] ([id number]) -------------------------------------------------------------------------------- Vitals Details Patient Name: [name] Date of Service: 06/16/2020 10:15 AM Medical Record Number: [id number] Patient Account Number: [id number] Date of Birth/Sex: 07-19-1929 (87 y.o. F) Treating RN: Dolan Amen Primary Care Michial Disney: Ria Bush Other Clinician: Jeanine Luz Referring Latorsha Curling: Ria Bush Treating Samuella Rasool/Extender: Skipper Cliche in Treatment: 35 Vital Signs Time Taken: 10:10 Temperature (F): 97.9 Height (in): 62 Pulse (bpm): 65 Weight (lbs): 140 Respiratory Rate (breaths/min): 20 Body Mass Index (BMI): 25.6 Blood Pressure  (mmHg): 164/79 Reference Range: 80 - 120 mg / dl Electronic  Signature(s) Signed: 06/19/2020 11:47:12 AM By: Jeanine Luz Entered By: Jeanine Luz on 06/16/2020 10:12:41

## 2020-06-21 ENCOUNTER — Ambulatory Visit (INDEPENDENT_AMBULATORY_CARE_PROVIDER_SITE_OTHER): Payer: Medicare Other | Admitting: Family Medicine

## 2020-06-21 ENCOUNTER — Encounter: Payer: Self-pay | Admitting: Family Medicine

## 2020-06-21 ENCOUNTER — Other Ambulatory Visit: Payer: Self-pay

## 2020-06-21 VITALS — BP 140/78 | HR 77 | Temp 97.6°F | Ht 61.5 in | Wt 126.5 lb

## 2020-06-21 DIAGNOSIS — K5909 Other constipation: Secondary | ICD-10-CM

## 2020-06-21 DIAGNOSIS — K219 Gastro-esophageal reflux disease without esophagitis: Secondary | ICD-10-CM | POA: Diagnosis not present

## 2020-06-21 DIAGNOSIS — Z23 Encounter for immunization: Secondary | ICD-10-CM | POA: Diagnosis not present

## 2020-06-21 DIAGNOSIS — M79672 Pain in left foot: Secondary | ICD-10-CM

## 2020-06-21 DIAGNOSIS — I4819 Other persistent atrial fibrillation: Secondary | ICD-10-CM | POA: Diagnosis not present

## 2020-06-21 DIAGNOSIS — I739 Peripheral vascular disease, unspecified: Secondary | ICD-10-CM

## 2020-06-21 DIAGNOSIS — R2681 Unsteadiness on feet: Secondary | ICD-10-CM | POA: Diagnosis not present

## 2020-06-21 DIAGNOSIS — M79604 Pain in right leg: Secondary | ICD-10-CM | POA: Diagnosis not present

## 2020-06-21 DIAGNOSIS — N3941 Urge incontinence: Secondary | ICD-10-CM

## 2020-06-21 DIAGNOSIS — M810 Age-related osteoporosis without current pathological fracture: Secondary | ICD-10-CM | POA: Diagnosis not present

## 2020-06-21 DIAGNOSIS — L97519 Non-pressure chronic ulcer of other part of right foot with unspecified severity: Secondary | ICD-10-CM

## 2020-06-21 DIAGNOSIS — G8929 Other chronic pain: Secondary | ICD-10-CM

## 2020-06-21 DIAGNOSIS — E538 Deficiency of other specified B group vitamins: Secondary | ICD-10-CM

## 2020-06-21 DIAGNOSIS — I10 Essential (primary) hypertension: Secondary | ICD-10-CM

## 2020-06-21 DIAGNOSIS — Z0000 Encounter for general adult medical examination without abnormal findings: Secondary | ICD-10-CM | POA: Diagnosis not present

## 2020-06-21 DIAGNOSIS — I700 Atherosclerosis of aorta: Secondary | ICD-10-CM

## 2020-06-21 DIAGNOSIS — E782 Mixed hyperlipidemia: Secondary | ICD-10-CM | POA: Diagnosis not present

## 2020-06-21 DIAGNOSIS — G2581 Restless legs syndrome: Secondary | ICD-10-CM

## 2020-06-21 DIAGNOSIS — D649 Anemia, unspecified: Secondary | ICD-10-CM | POA: Diagnosis not present

## 2020-06-21 DIAGNOSIS — M79671 Pain in right foot: Secondary | ICD-10-CM

## 2020-06-21 DIAGNOSIS — I5032 Chronic diastolic (congestive) heart failure: Secondary | ICD-10-CM

## 2020-06-21 DIAGNOSIS — N1831 Chronic kidney disease, stage 3a: Secondary | ICD-10-CM

## 2020-06-21 DIAGNOSIS — G6289 Other specified polyneuropathies: Secondary | ICD-10-CM

## 2020-06-21 DIAGNOSIS — E559 Vitamin D deficiency, unspecified: Secondary | ICD-10-CM

## 2020-06-21 DIAGNOSIS — I272 Pulmonary hypertension, unspecified: Secondary | ICD-10-CM

## 2020-06-21 DIAGNOSIS — G3184 Mild cognitive impairment, so stated: Secondary | ICD-10-CM

## 2020-06-21 DIAGNOSIS — F331 Major depressive disorder, recurrent, moderate: Secondary | ICD-10-CM

## 2020-06-21 DIAGNOSIS — M79605 Pain in left leg: Secondary | ICD-10-CM

## 2020-06-21 DIAGNOSIS — Z515 Encounter for palliative care: Secondary | ICD-10-CM

## 2020-06-21 NOTE — Telephone Encounter (Signed)
Pt had labs on 06/15/20. Ca is normal and CrCL is 31.71 Routing to PCP to make aware of CrCl. Pt is scheduled for prolia inj on 3/29

## 2020-06-21 NOTE — Telephone Encounter (Signed)
Yes plz schedule lab visit after prolia injection. Thank you.

## 2020-06-21 NOTE — Progress Notes (Signed)
Patient ID: [name], female    DOB: 23-May-1929, 87 y.o.   MRN: [id number]  This visit was conducted in person.  BP 140/78   Pulse 77   Temp 97.6 F (36.4 C) (Temporal)   Ht 5' 1.5" (1.562 m)   Wt 126 lb 8 oz (57.4 kg)   SpO2 96%   BMI 23.51 kg/m    CC: AMW  Subjective:   HPI: [name] is a 87 y.o. female presenting on 06/21/2020 for Medicare Wellness (Pt accompanied by daughter, [name]- temp )   Did not see health advisor this year.    Hearing Screening   125Hz  250Hz  500Hz  1000Hz  2000Hz  3000Hz  4000Hz  6000Hz  8000Hz   Right ear:   0 0 0  0    Left ear:   0 0 0  0    Comments: Pt heard practice tone in bilateral ears at 40 dBHL.  Pt wears bilateral hearing aids.  Not wearing at today's OV.    Visual Acuity Screening   Right eye Left eye Both eyes  Without correction: 20/40 20/50 20/50   With correction:      Crystal Office Visit from 06/21/2020 in [place] at [place]  PHQ-2 Total Score 4      Fall Risk  06/21/2020 04/28/2019 04/22/2018 11/11/2016 05/28/2016  Falls in the past year? 1 1 1  No Yes  Comment - Balance issues - - pt reports multiple falls while gardening or doing other yard work  Number falls in past yr: 1 1 1  - 2 or more  Injury with Fall? 1 0 1 - No  Comment Left arm abrasion - - - -  Risk for fall due to : - Medication side effect;History of fall(s);Impaired balance/gait Impaired balance/gait;Impaired mobility;History of fall(s) - -  Follow up - Falls evaluation completed;Falls prevention discussed - - -  Latest fall 1 wk ago. Bump to back of head.   Daughter [name] is working with [facility] to get assistant at [facility]. Also reaching out to [employer] for further resources.   Appetite dropping, weight dropping. Planning to restart daily boost   Namenda started 12/2019 for worsening memory.  Worsening RLS despite requip 2.5mg  nightly, tylenol, tramadol. Tizanidine has helped. Needs iron panel. Has dropped gabapentin dose  to 300mg  once daily due to worsening unsteadiness. HHPT last 03/2020.   HTN - markedly elevated readings in office recently, normal at home. Anticipate component of white coat hypertension. Continues lisinopril 10mg  daily, metoprolol 12.5mg  bid. BP better controlled today, unclear why it has improved.   Chronic constipation managed with miralax, senna PRN, enema if needed. Good water, fiber. Ongoing trouble.   NCS - Abnormal study (2020). There is electrodiagnostic evidence of a chronic, severe, mixed polyneuropathy in the lower extremities.  Saw [facility] for bilateral leg/foot pain s/p angiogram with intervention (ballon angioplasty) - with improvement in LE symptoms. Walking without pain.   Preventative: Colon cancer screening - age out. Normal iFOBs last 05/2012. Denies blood in stool or urine.  Well woman - she has had a hysterectomy. We have stopped Pap smears.  Mammogramabnormal2018 s/p R breast lumpectomy of benign complex sclerosing lesion (Dr Renelda Loma). Last mammogram @ [place] 06/2019 WNL. Does breast exams at home without concerns. Will space out to Q2 yrs.  DEXA T -2.7 spine, -2.6 hip(07/2016) -known osteoporosis with compression fractures, latest after fall 02/2017 now on prolia (started 08/2017)  DEXA 06/2019 - osteopenia T -2.4 L forearm. FRAX - hip fx risk >3%.  Continues prolia shots - due today - scheduled for later this month.  Flu - yearly [place] 05/2019, 06/2019  Td - 2005 Pneumovax 2005, prevnar10/2016 shingrix - discussed - h/o ocular shingles. Advanced planning -Living willscanned 04/2017. No HCPOA form. Asked to bring me copy - son Dominica Payne is HCPOA.Does not want extended life support. Seat belt use discussed Sunscreen use discussed. Notes scaly skin lesions on both dorsal hands and on chest.  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 Widowed 01/1999; 1st marriage 25 years / 2nd marriage 13 years. One son deceased 01/27/23. Activity: active with dog Diet: good water, fruits/vegetables daily     Relevant past medical, surgical, family and social history reviewed and updated as indicated. Interim medical history since our last visit reviewed. Allergies and medications reviewed and updated. Outpatient Medications Prior to Visit  Medication Sig Dispense Refill  . acetaminophen (TYLENOL) 500 MG tablet Take 1,000 mg every 8 (eight) hours as needed by mouth for moderate pain.     Marland Kitchen acyclovir (ZOVIRAX) 400 MG tablet Take 400 mg by mouth 2 (two) times daily.    Marland Kitchen amiodarone (PACERONE) 100 MG tablet Take 1 tablet (100 mg total) by mouth daily. 30 tablet 0  . aspirin EC 81 MG tablet Take 1 tablet (81 mg total) by mouth daily. 150 tablet 2  . atorvastatin (LIPITOR) 20 MG tablet TAKE 1 TABLET BY MOUTH ONCE DAILY 90 tablet 1  . DULoxetine (CYMBALTA) 60 MG capsule TAKE 1 CAPSULE BY MOUTH ONCE DAILY 90 capsule 1  . ELIQUIS 2.5 MG TABS tablet TAKE 1 TABLET BY MOUTH TWICE DAILY 180 tablet 1  . gabapentin (NEURONTIN) 300 MG capsule Take 1 capsule (300 mg total) by mouth at bedtime. With second dose as needed    . lisinopril (ZESTRIL) 10 MG tablet Take 1 tablet (10 mg total) by mouth daily. 30 tablet 6  . memantine (NAMENDA) 5 MG tablet TAKE 1 TABLET BY MOUTH TWICE DAILY 60 tablet 6  . metoprolol tartrate (LOPRESSOR) 25 MG tablet TAKE 1/2 TABLET BY MOUTH TWICE DAILY 90 tablet 3  . pantoprazole (PROTONIX) 40 MG tablet TAKE 1 TABLET BY MOUTH EVERY DAY 90 tablet 1  . polyethylene glycol (MIRALAX / GLYCOLAX) 17 g packet Take 17 g by mouth daily as needed for mild constipation. 14 each 0  . prednisoLONE acetate (PRED FORTE) 1 % ophthalmic suspension Place 1 drop into the left eye daily.     Marland Kitchen rOPINIRole (REQUIP) 1 MG tablet Take  2.5 tablets (2.5 mg total) by mouth at bedtime. 225 tablet 1  . senna (SENOKOT) 8.6 MG TABS tablet Take 1 tablet (8.6 mg total) by mouth daily as needed for mild constipation.    . Sulfamethoxazole-Trimethoprim (BACTRIM PO) Take by mouth. Taking for foot wound    . traMADol (ULTRAM) 50 MG tablet Take 50 mg by mouth daily. Takes 2 tablets daily  0  . Vitamin D, Ergocalciferol, (DRISDOL) 1.25 MG (50000 UNIT) CAPS capsule TAKE 1 CAPSULE BY MOUTH ONCE WEEKLY 12 capsule 3   No facility-administered medications prior to visit.     Per HPI unless specifically indicated in ROS section below Review of Systems  Constitutional: Positive for appetite change (decreased) and fatigue. Negative for activity change, chills,  fever and unexpected weight change.  HENT: Negative for hearing loss.   Eyes: Negative for visual disturbance.  Respiratory: Positive for shortness of breath. Negative for cough, chest tightness and wheezing.        Chest congestion  Cardiovascular: Negative for chest pain, palpitations and leg swelling.  Gastrointestinal: Negative for abdominal distention, abdominal pain, blood in stool, constipation, diarrhea, nausea and vomiting.  Genitourinary: Negative for difficulty urinating and hematuria.  Musculoskeletal: Negative for arthralgias, myalgias and neck pain.  Skin: Negative for rash.  Neurological: Negative for dizziness, seizures, syncope and headaches.  Hematological: Negative for adenopathy. Bruises/bleeds easily.  Psychiatric/Behavioral: Positive for dysphoric mood. The patient is not nervous/anxious.    Objective:  BP 140/78   Pulse 77   Temp 97.6 F (36.4 C) (Temporal)   Ht 5' 1.5" (1.562 m)   Wt 126 lb 8 oz (57.4 kg)   SpO2 96%   BMI 23.51 kg/m   Wt Readings from Last 3 Encounters:  06/21/20 126 lb 8 oz (57.4 kg)  06/12/20 129 lb (58.5 kg)  05/23/20 129 lb (58.5 kg)      Physical Exam Vitals and nursing note reviewed.  Constitutional:      General: She is  not in acute distress.    Appearance: Normal appearance. She is well-developed. She is not ill-appearing.  HENT:     Head: Normocephalic and atraumatic.     Right Ear: Hearing, tympanic membrane, ear canal and external ear normal.     Left Ear: Hearing, tympanic membrane, ear canal and external ear normal.  Eyes:     General: No scleral icterus.    Extraocular Movements: Extraocular movements intact.     Conjunctiva/sclera: Conjunctivae normal.     Pupils: Pupils are equal, round, and reactive to light.  Neck:     Thyroid: No thyroid mass or thyromegaly.  Cardiovascular:     Rate and Rhythm: Normal rate and regular rhythm.     Pulses: Normal pulses.          Radial pulses are 2+ on the right side and 2+ on the left side.     Heart sounds: Normal heart sounds. No murmur heard.   Pulmonary:     Effort: Pulmonary effort is normal. No respiratory distress.     Breath sounds: Normal breath sounds. No wheezing, rhonchi or rales.  Abdominal:     General: Abdomen is flat. Bowel sounds are normal. There is no distension.     Palpations: Abdomen is soft. There is no mass.     Tenderness: There is no abdominal tenderness. There is no guarding or rebound.     Hernia: No hernia is present.  Musculoskeletal:        General: Tenderness (chronic) present. Normal range of motion.     Cervical back: Normal range of motion and neck supple.     Right lower leg: No edema.     Left lower leg: No edema.     Comments: Distal extremity darkening of skin   Lymphadenopathy:     Cervical: No cervical adenopathy.  Skin:    General: Skin is warm and dry.     Findings: No rash.  Neurological:     General: No focal deficit present.     Mental Status: She is alert and oriented to person, place, and time.     Comments:  CN grossly intact, station and gait intact Recall 3/3 Calculation 4/5  DLUOW  Psychiatric:  Mood and Affect: Mood normal.        Behavior: Behavior normal.        Thought  Content: Thought content normal.        Judgment: Judgment normal.       Results for orders placed or performed in visit on 06/21/20  Ferritin  Result Value Ref Range   Ferritin 22.4 10.0 - 291.0 ng/mL  IBC panel  Result Value Ref Range   Iron 37 (L) 42 - 145 ug/dL   Transferrin 350.0 212.0 - 360.0 mg/dL   Saturation Ratios 7.6 (L) 20.0 - 50.0 %   No results found for: HGBA1C  Lab Results  Component Value Date   CHOL 133 06/15/2020   HDL 71.70 06/15/2020   LDLCALC 47 06/15/2020   LDLDIRECT 173.1 02/19/2010   TRIG 70.0 06/15/2020   CHOLHDL 2 06/15/2020    Lab Results  Component Value Date   CREATININE 1.09 06/15/2020   BUN 13 06/15/2020   NA 135 06/15/2020   K 5.3 (H) 06/15/2020   CL 101 06/15/2020   CO2 28 06/15/2020    Lab Results  Component Value Date   WBC 6.5 06/15/2020   HGB 11.1 (L) 06/15/2020   HCT 34.9 (L) 06/15/2020   MCV 85.1 06/15/2020   PLT 308.0 06/15/2020    Lab Results  Component Value Date   VITAMINB12 249 06/15/2020    Lab Results  Component Value Date   ALT 15 06/15/2020   AST 17 06/15/2020   ALKPHOS 63 06/15/2020   BILITOT 0.4 06/15/2020    Depression screen PHQ 2/9 06/21/2020 04/28/2019 04/22/2018  Decreased Interest 3 0 0  Down, Depressed, Hopeless 1 0 0  PHQ - 2 Score 4 0 0  Altered sleeping 3 0 0  Tired, decreased energy 3 0 0  Change in appetite 3 0 0  Feeling bad or failure about yourself  0 0 0  Trouble concentrating 1 0 0  Moving slowly or fidgety/restless 1 0 0  Suicidal thoughts 0 0 0  PHQ-9 Score 15 0 0  Difficult doing work/chores - Not difficult at all Not difficult at all  Some recent data might be hidden    GAD 7 : Generalized Anxiety Score 06/21/2020  Nervous, Anxious, on Edge 2  Control/stop worrying 3  Worry too much - different things 3  Trouble relaxing 3  Restless 2  Easily annoyed or irritable 2  Afraid - awful might happen 1  Total GAD 7 Score 16   Assessment & Plan:  This visit occurred during the  SARS-CoV-2 public health emergency.  Safety protocols were in place, including screening questions prior to the visit, additional usage of staff PPE, and extensive cleaning of exam room while observing appropriate contact time as indicated for disinfecting solutions.   Problem List Items Addressed This Visit    HLD (hyperlipidemia)    Chronic, stable on atorvastatin.  The ASCVD Risk score Mikey Bussing DC Jr., et al., 2013) failed to calculate for the following reasons:   The 2013 ASCVD risk score is only valid for ages 63 to 59       MDD (major depressive disorder), recurrent episode ([place])    Chronic, stable on cymbalta 60mg  daily.       Essential hypertension    Chronic, adequate. Continue current regimen.       GERD    Chronic, stable on daily protonix - continue.       Osteoporosis    Continue Q29mo prolia  injection - next due later this month.       RLS (restless legs syndrome)    Worsening despite requip 2.5mg  nightly, tramadol, tylenol. Also on tizanidine with benefit. Check iron panel, if low consider iron infusion.  Has dropped gabapentin to 300mg  once daily due to worsening unsteadiness.       Medicare annual wellness visit, subsequent - Primary    I have personally reviewed the Medicare Annual Wellness questionnaire and have noted 1. The patient's medical and social history 2. Their use of alcohol, tobacco or illicit drugs 3. Their current medications and supplements 4. The patient's functional ability including ADL's, fall risks, home safety risks and hearing or visual impairment. Cognitive function has been assessed and addressed as indicated.  5. Diet and physical activity 6. Evidence for depression or mood disorders The patients weight, height, BMI have been recorded in the chart. I have made referrals, counseling and provided education to the patient based on review of the above and I have provided the pt with a written personalized care plan for preventive  services. Provider list updated.. See scanned questionairre as needed for further documentation. Reviewed preventative protocols and updated unless pt declined.       Persistent atrial fibrillation (HCC)    CBC stable. Continue aspirin, eliquis.       Chronic leg pain   Health maintenance examination    Preventative protocols reviewed and updated unless pt declined. Discussed healthy diet and lifestyle.       Abdominal aortic atherosclerosis (HCC)    Continue aspirin, statin.       General unsteadiness    S/p HHPT involvement 03/2020  Has dropped gabapentin to 300mg  once daily due to worsening unsteadiness.       Chronic constipation    Continue miralax, senna PRN, enema PRN. Continue good water and fiber intake.  Avoid oral iron.       Peripheral neuropathy    Chronic, severe. Gabapentin dosing limited by unsteadiness.       Vitamin D deficiency    Continue weekly replacement       Bilateral leg and foot pain   Vitamin B12 deficiency    Continue monthly injections as levels remain low normal.       PAD (peripheral artery disease) (HCC)    Appreciate [facility] care.       Urge incontinence    Ongoing urge > stress incontinence.  Continue to monitor.       Pulmonary hypertension, unspecified ([place])   Ulcer of right foot ([place])    Appreciate wound clinic care      CKD (chronic kidney disease) stage 3, GFR 30-59 ml/min (HCC)    Chronic, stable. K slightly elevated after recent ACEI start - continue to monitor.       Chronic diastolic CHF (congestive heart failure) (HCC)   Palliative care by specialist   MCI (mild cognitive impairment) with memory loss    Will continue to monitor on namenda.  Overall stable period on memory testing today.       Anemia    Update iron panel. Low iron could contribute to worsening RLS      Relevant Orders   Ferritin (Completed)   IBC panel (Completed)    Other Visit Diagnoses    Need for 23-polyvalent pneumococcal  polysaccharide vaccine       Relevant Orders   Pneumococcal polysaccharide vaccine 23-valent greater than or equal to 2yo subcutaneous/IM (Completed)       No  orders of the defined types were placed in this encounter.  Orders Placed This Encounter  Procedures  . Pneumococcal polysaccharide vaccine 23-valent greater than or equal to 2yo subcutaneous/IM  . Ferritin  . IBC panel    Patient instructions: Update pneumovax-23 today  b12 shot if you're due today.  We will see if you can get prolia shot today.  Mild anemia - you may have iron deficiency. Iron levels checked today - if low, will recommend iron infusion at [facility].  If interested, check with pharmacy about new 2 shot shingles series (shingrix).  Ok to get COVID booster shot.  Touch base with eye doctor.  Return as needed or in 4 months for follow up visit.   Follow up plan: Return in about 4 months (around 10/21/2020) for follow up visit.  Ria Bush, MD

## 2020-06-21 NOTE — Patient Instructions (Addendum)
Update pneumovax-23 today  b12 shot if you're due today.  We will see if you can get prolia shot today.  Mild anemia - you may have iron deficiency. Iron levels checked today - if low, will recommend iron infusion at [facility].  If interested, check with pharmacy about new 2 shot shingles series (shingrix).  Ok to get COVID booster shot.  Touch base with eye doctor.  Return as needed or in 4 months for follow up visit.   Health Maintenance After Age 74 After age 15, you are at a higher risk for certain long-term diseases and infections as well as injuries from falls. Falls are a major cause of broken bones and head injuries in people who are older than age 36. Getting regular preventive care can help to keep you healthy and well. Preventive care includes getting regular testing and making lifestyle changes as recommended by your health care provider. Talk with your health care provider about:  Which screenings and tests you should have. A screening is a test that checks for a disease when you have no symptoms.  A diet and exercise plan that is right for you. What should I know about screenings and tests to prevent falls? Screening and testing are the best ways to find a health problem early. Early diagnosis and treatment give you the best chance of managing medical conditions that are common after age 32. Certain conditions and lifestyle choices may make you more likely to have a fall. Your health care provider may recommend:  Regular vision checks. Poor vision and conditions such as cataracts can make you more likely to have a fall. If you wear glasses, make sure to get your prescription updated if your vision changes.  Medicine review. Work with your health care provider to regularly review all of the medicines you are taking, including over-the-counter medicines. Ask your health care provider about any side effects that may make you more likely to have a fall. Tell your health care provider  if any medicines that you take make you feel dizzy or sleepy.  Osteoporosis screening. Osteoporosis is a condition that causes the bones to get weaker. This can make the bones weak and cause them to break more easily.  Blood pressure screening. Blood pressure changes and medicines to control blood pressure can make you feel dizzy.  Strength and balance checks. Your health care provider may recommend certain tests to check your strength and balance while standing, walking, or changing positions.  Foot health exam. Foot pain and numbness, as well as not wearing proper footwear, can make you more likely to have a fall.  Depression screening. You may be more likely to have a fall if you have a fear of falling, feel emotionally low, or feel unable to do activities that you used to do.  Alcohol use screening. Using too much alcohol can affect your balance and may make you more likely to have a fall. What actions can I take to lower my risk of falls? General instructions  Talk with your health care provider about your risks for falling. Tell your health care provider if: ? You fall. Be sure to tell your health care provider about all falls, even ones that seem minor. ? You feel dizzy, sleepy, or off-balance.  Take over-the-counter and prescription medicines only as told by your health care provider. These include any supplements.  Eat a healthy diet and maintain a healthy weight. A healthy diet includes low-fat dairy products, low-fat (lean) meats, and  fiber from whole grains, beans, and lots of fruits and vegetables. Home safety  Remove any tripping hazards, such as rugs, cords, and clutter.  Install safety equipment such as grab bars in bathrooms and safety rails on stairs.  Keep rooms and walkways well-lit. Activity  Follow a regular exercise program to stay fit. This will help you maintain your balance. Ask your health care provider what types of exercise are appropriate for you.  If  you need a cane or walker, use it as recommended by your health care provider.  Wear supportive shoes that have nonskid soles.   Lifestyle  Do not drink alcohol if your health care provider tells you not to drink.  If you drink alcohol, limit how much you have: ? 0-1 drink a day for women. ? 0-2 drinks a day for men.  Be aware of how much alcohol is in your drink. In the [place] one drink equals one typical bottle of beer (12 oz), one-half glass of wine (5 oz), or one shot of hard liquor (1 oz).  Do not use any products that contain nicotine or tobacco, such as cigarettes and e-cigarettes. If you need help quitting, ask your health care provider. Summary  Having a healthy lifestyle and getting preventive care can help to protect your health and wellness after age 38.  Screening and testing are the best way to find a health problem early and help you avoid having a fall. Early diagnosis and treatment give you the best chance for managing medical conditions that are more common for people who are older than age 14.  Falls are a major cause of broken bones and head injuries in people who are older than age 5. Take precautions to prevent a fall at home.  Work with your health care provider to learn what changes you can make to improve your health and wellness and to prevent falls. This information is not intended to replace advice given to you by your health care provider. Make sure you discuss any questions you have with your health care provider. Document Revised: 07/16/2018 Document Reviewed: 02/05/2017 Elsevier Patient Education  2021 [employer].

## 2020-06-21 NOTE — Addendum Note (Signed)
Addended by: Randall An on: 06/21/2020 03:17 PM   Modules accepted: Orders

## 2020-06-21 NOTE — Telephone Encounter (Signed)
Last prolia inj was on 12/27/19 at [place].  Next prolia due after 06/26/20.  Pt in today for OV and wanting to know if she can have prolia. It is too soon for inj.   Advised pt and daughter and scheduled NV for 07/04/20. Pt will get labs today. Added BMP order

## 2020-06-22 DIAGNOSIS — M5136 Other intervertebral disc degeneration, lumbar region: Secondary | ICD-10-CM | POA: Diagnosis not present

## 2020-06-22 DIAGNOSIS — Z7901 Long term (current) use of anticoagulants: Secondary | ICD-10-CM | POA: Diagnosis not present

## 2020-06-22 DIAGNOSIS — I2510 Atherosclerotic heart disease of native coronary artery without angina pectoris: Secondary | ICD-10-CM | POA: Diagnosis not present

## 2020-06-22 DIAGNOSIS — Z792 Long term (current) use of antibiotics: Secondary | ICD-10-CM | POA: Diagnosis not present

## 2020-06-22 DIAGNOSIS — M810 Age-related osteoporosis without current pathological fracture: Secondary | ICD-10-CM | POA: Diagnosis not present

## 2020-06-22 DIAGNOSIS — Z7982 Long term (current) use of aspirin: Secondary | ICD-10-CM | POA: Diagnosis not present

## 2020-06-22 DIAGNOSIS — N1831 Chronic kidney disease, stage 3a: Secondary | ICD-10-CM | POA: Diagnosis not present

## 2020-06-22 DIAGNOSIS — I130 Hypertensive heart and chronic kidney disease with heart failure and stage 1 through stage 4 chronic kidney disease, or unspecified chronic kidney disease: Secondary | ICD-10-CM | POA: Diagnosis not present

## 2020-06-22 DIAGNOSIS — I872 Venous insufficiency (chronic) (peripheral): Secondary | ICD-10-CM | POA: Diagnosis not present

## 2020-06-22 DIAGNOSIS — I272 Pulmonary hypertension, unspecified: Secondary | ICD-10-CM | POA: Diagnosis not present

## 2020-06-22 DIAGNOSIS — S62102D Fracture of unspecified carpal bone, left wrist, subsequent encounter for fracture with routine healing: Secondary | ICD-10-CM | POA: Diagnosis not present

## 2020-06-22 DIAGNOSIS — I5032 Chronic diastolic (congestive) heart failure: Secondary | ICD-10-CM | POA: Diagnosis not present

## 2020-06-22 DIAGNOSIS — K219 Gastro-esophageal reflux disease without esophagitis: Secondary | ICD-10-CM | POA: Diagnosis not present

## 2020-06-22 DIAGNOSIS — E538 Deficiency of other specified B group vitamins: Secondary | ICD-10-CM | POA: Diagnosis not present

## 2020-06-22 DIAGNOSIS — M1990 Unspecified osteoarthritis, unspecified site: Secondary | ICD-10-CM | POA: Diagnosis not present

## 2020-06-22 DIAGNOSIS — Z9181 History of falling: Secondary | ICD-10-CM | POA: Diagnosis not present

## 2020-06-22 DIAGNOSIS — L97513 Non-pressure chronic ulcer of other part of right foot with necrosis of muscle: Secondary | ICD-10-CM | POA: Diagnosis not present

## 2020-06-22 DIAGNOSIS — G2581 Restless legs syndrome: Secondary | ICD-10-CM | POA: Diagnosis not present

## 2020-06-22 DIAGNOSIS — E7849 Other hyperlipidemia: Secondary | ICD-10-CM | POA: Diagnosis not present

## 2020-06-22 DIAGNOSIS — G9009 Other idiopathic peripheral autonomic neuropathy: Secondary | ICD-10-CM | POA: Diagnosis not present

## 2020-06-22 DIAGNOSIS — I4891 Unspecified atrial fibrillation: Secondary | ICD-10-CM | POA: Diagnosis not present

## 2020-06-22 DIAGNOSIS — E559 Vitamin D deficiency, unspecified: Secondary | ICD-10-CM | POA: Diagnosis not present

## 2020-06-22 LAB — IBC PANEL
Iron: 37 ug/dL — ABNORMAL LOW (ref 42–145)
Saturation Ratios: 7.6 % — ABNORMAL LOW (ref 20.0–50.0)
Transferrin: 350 mg/dL (ref 212.0–360.0)

## 2020-06-22 LAB — FERRITIN: Ferritin: 22.4 ng/mL (ref 10.0–291.0)

## 2020-06-22 NOTE — Addendum Note (Signed)
Addended by: Randall An on: 06/22/2020 10:40 AM   Modules accepted: Orders

## 2020-06-22 NOTE — Telephone Encounter (Signed)
Scheduled for lab on 4/7 due to pt's daughter going out of town on 4/8.  Added BMP order

## 2020-06-23 ENCOUNTER — Encounter: Payer: Medicare Other | Admitting: Physician Assistant

## 2020-06-23 ENCOUNTER — Other Ambulatory Visit: Payer: Self-pay

## 2020-06-23 DIAGNOSIS — Z7901 Long term (current) use of anticoagulants: Secondary | ICD-10-CM | POA: Diagnosis not present

## 2020-06-23 DIAGNOSIS — I739 Peripheral vascular disease, unspecified: Secondary | ICD-10-CM | POA: Diagnosis not present

## 2020-06-23 DIAGNOSIS — L97512 Non-pressure chronic ulcer of other part of right foot with fat layer exposed: Secondary | ICD-10-CM | POA: Diagnosis not present

## 2020-06-23 DIAGNOSIS — I7389 Other specified peripheral vascular diseases: Secondary | ICD-10-CM | POA: Diagnosis not present

## 2020-06-23 DIAGNOSIS — I480 Paroxysmal atrial fibrillation: Secondary | ICD-10-CM | POA: Diagnosis not present

## 2020-06-23 NOTE — Progress Notes (Addendum)
[name], [name] ([id number]) Visit Report for 06/23/2020 Arrival Information Details Patient Name: [name], [name]. Date of Service: 06/23/2020 11:00 AM Medical Record Number: [id number] Patient Account Number: [id number] Date of Birth/Sex: [birth year] (87 y.o. F) Treating RN: Carlene Coria Primary Care Aaleyah Witherow: Ria Bush Other Clinician: Jeanine Luz Referring Brandilyn Nanninga: Ria Bush Treating Erikah Thumm/Extender: Skipper Cliche in Treatment: 36 Visit Information History Since Last Visit Added or deleted any medications: No Patient Arrived: [name] Had a fall or experienced change in No Arrival Time: 11:41 activities of daily living that may affect Accompanied By: daughter risk of falls: Transfer Assistance: None Hospitalized since last visit: No Patient Identification Verified: Yes Pain Present Now: No Secondary Verification Process Completed: Yes Patient Requires Transmission-Based No Precautions: Patient Has Alerts: Yes Patient Alerts: 08/31/19 ABI R)1.3 L) 1.31 08/31/19 TBI R).61 L).58 Electronic Signature(s) Signed: 06/23/2020 2:55:19 PM By: Jeanine Luz Entered By: Jeanine Luz on 06/23/2020 11:42:18 [name] ([id number]) -------------------------------------------------------------------------------- Clinic Level of Care Assessment Details Patient Name: [name] Date of Service: 06/23/2020 11:00 AM Medical Record Number: [id number] Patient Account Number: [id number] Date of Birth/Sex: 12/15/1929 (87 y.o. F) Treating RN: Cornell Barman Primary Care Shaila Gilchrest: Ria Bush Other Clinician: Jeanine Luz Referring Torie Towle: Ria Bush Treating Tu Bayle/Extender: Skipper Cliche in Treatment: 36 Clinic Level of Care Assessment Items TOOL 4 Quantity Score []  - Use when only an EandM is performed on FOLLOW-UP visit 0 ASSESSMENTS - Nursing Assessment / Reassessment X - Reassessment of Co-morbidities (includes updates in  patient status) 1 10 X- 1 5 Reassessment of Adherence to Treatment Plan ASSESSMENTS - Wound and Skin Assessment / Reassessment X - Simple Wound Assessment / Reassessment - one wound 1 5 []  - 0 Complex Wound Assessment / Reassessment - multiple wounds []  - 0 Dermatologic / Skin Assessment (not related to wound area) ASSESSMENTS - Focused Assessment []  - Circumferential Edema Measurements - multi extremities 0 []  - 0 Nutritional Assessment / Counseling / Intervention []  - 0 Lower Extremity Assessment (monofilament, tuning fork, pulses) []  - 0 Peripheral Arterial Disease Assessment (using hand held doppler) ASSESSMENTS - Ostomy and/or Continence Assessment and Care []  - Incontinence Assessment and Management 0 []  - 0 Ostomy Care Assessment and Management (repouching, etc.) PROCESS - Coordination of Care X - Simple Patient / Family Education for ongoing care 1 15 []  - 0 Complex (extensive) Patient / Family Education for ongoing care X- 1 10 Staff obtains Programmer, systems, Records, Test Results / Process Orders []  - 0 Staff telephones HHA, Nursing Homes / Clarify orders / etc []  - 0 Routine Transfer to another Facility (non-emergent condition) []  - 0 Routine Hospital Admission (non-emergent condition) []  - 0 New Admissions / Biomedical engineer / Ordering NPWT, Apligraf, etc. []  - 0 Emergency Hospital Admission (emergent condition) X- 1 10 Simple Discharge Coordination []  - 0 Complex (extensive) Discharge Coordination PROCESS - Special Needs []  - Pediatric / Minor Patient Management 0 []  - 0 Isolation Patient Management []  - 0 Hearing / Language / Visual special needs []  - 0 Assessment of Community assistance (transportation, D/C planning, etc.) []  - 0 Additional assistance / Altered mentation []  - 0 Support Surface(s) Assessment (bed, cushion, seat, etc.) INTERVENTIONS - Wound Cleansing / Measurement [name], MUZYKA. ([id number]) X- 1 5 Simple Wound Cleansing - one  wound []  - 0 Complex Wound Cleansing - multiple wounds X- 1 5 Wound Imaging (photographs - any number of wounds) []  - 0 Wound Tracing (instead of photographs) X- 1 5 Simple Wound Measurement -  one wound []  - 0 Complex Wound Measurement - multiple wounds INTERVENTIONS - Wound Dressings X - Small Wound Dressing one or multiple wounds 1 10 []  - 0 Medium Wound Dressing one or multiple wounds []  - 0 Large Wound Dressing one or multiple wounds []  - 0 Application of Medications - topical []  - 0 Application of Medications - injection INTERVENTIONS - Miscellaneous []  - External ear exam 0 []  - 0 Specimen Collection (cultures, biopsies, blood, body fluids, etc.) []  - 0 Specimen(s) / Culture(s) sent or taken to Lab for analysis []  - 0 Patient Transfer (multiple staff / Civil Service fast streamer / Similar devices) []  - 0 Simple Staple / Suture removal (25 or less) []  - 0 Complex Staple / Suture removal (26 or more) []  - 0 Hypo / Hyperglycemic Management (close monitor of Blood Glucose) []  - 0 Ankle / Brachial Index (ABI) - do not check if billed separately X- 1 5 Vital Signs Has the patient been seen at the hospital within the last three years: Yes Total Score: 85 Level Of Care: New/Established - Level 3 Electronic Signature(s) Signed: 06/23/2020 5:20:30 PM By: Gretta Cool, BSN, RN, CWS, Kim RN, BSN Entered By: Gretta Cool, BSN, RN, CWS, Kim on 06/23/2020 12:09:08 [name] ([id number]) -------------------------------------------------------------------------------- Encounter Discharge Information Details Patient Name: [name]. Date of Service: 06/23/2020 11:00 AM Medical Record Number: [id number] Patient Account Number: [id number] Date of Birth/Sex: 09/16/1929 (87 y.o. F) Treating RN: Cornell Barman Primary Care Walida Cajas: Ria Bush Other Clinician: Jeanine Luz Referring Rashard Ryle: Ria Bush Treating Layia Walla/Extender: Skipper Cliche in Treatment: 36 Encounter  Discharge Information Items Discharge Condition: Stable Ambulatory Status: Walker Discharge Destination: Home Transportation: Private Auto Accompanied By: daughter Schedule Follow-up Appointment: Yes Clinical Summary of Care: Electronic Signature(s) Signed: 06/23/2020 5:20:30 PM By: Gretta Cool, BSN, RN, CWS, Kim RN, BSN Entered By: Gretta Cool, BSN, RN, CWS, Kim on 06/23/2020 12:09:56 [name] ([id number]) -------------------------------------------------------------------------------- Lower Extremity Assessment Details Patient Name: [name], CATER. Date of Service: 06/23/2020 11:00 AM Medical Record Number: [id number] Patient Account Number: [id number] Date of Birth/Sex: [birth year] (87 y.o. F) Treating RN: Carlene Coria Primary Care Hobert Poplaski: Ria Bush Other Clinician: Jeanine Luz Referring Nazly Digilio: Ria Bush Treating Zalika Tieszen/Extender: Jeri Cos Weeks in Treatment: 36 Electronic Signature(s) Signed: 06/23/2020 2:55:19 PM By: Jeanine Luz Signed: 07/14/2020 8:11:40 AM By: Carlene Coria RN Entered By: Jeanine Luz on 06/23/2020 11:52:11 [name] ([id number]) -------------------------------------------------------------------------------- Multi Wound Chart Details Patient Name: [name], ROZAK. Date of Service: 06/23/2020 11:00 AM Medical Record Number: [id number] Patient Account Number: [id number] Date of Birth/Sex: February 16, 1930 (87 y.o. F) Treating RN: Cornell Barman Primary Care Ronith Berti: Ria Bush Other Clinician: Jeanine Luz Referring Adriane Guglielmo: Ria Bush Treating [name]/Extender: Skipper Cliche in Treatment: 36 Vital Signs Height(in): 62 Pulse(bpm): 26 Weight(lbs): 140 Blood Pressure(mmHg): 152/89 Body Mass Index(BMI): 26 Temperature(F): 98.0 Respiratory Rate(breaths/min): 18 Photos: [N/A:N/A] Wound Location: Right Metatarsal head fifth N/A N/A Wounding Event: Gradually Appeared N/A N/A Primary Etiology: Arterial  Insufficiency Ulcer N/A N/A Comorbid History: Arrhythmia, Osteoarthritis N/A N/A Date Acquired: 04/09/2019 N/A N/A Weeks of Treatment: 36 N/A N/A Wound Status: Open N/A N/A Measurements L x W x D (cm) 0.1x0.1x0.1 N/A N/A Area (cm) : 0.008 N/A N/A Volume (cm) : 0.001 N/A N/A % Reduction in Area: 99.30% N/A N/A % Reduction in Volume: 99.10% N/A N/A Classification: Full Thickness Without Exposed N/A N/A Support Structures Exudate Amount: None Present N/A N/A Wound Margin: Distinct, outline attached N/A N/A Granulation Amount: Small (1-33%) N/A N/A Granulation Quality: Pink N/A  N/A Necrotic Amount: None Present (0%) N/A N/A Exposed Structures: Fascia: No N/A N/A Fat Layer (Subcutaneous Tissue): No Tendon: No Muscle: No Joint: No Bone: No Epithelialization: Small (1-33%) N/A N/A Treatment Notes Electronic Signature(s) Signed: 06/23/2020 5:20:30 PM By: Gretta Cool, BSN, RN, CWS, Kim RN, BSN Entered By: Gretta Cool, BSN, RN, CWS, Kim on 06/23/2020 12:04:51 [name] ([id number]) -------------------------------------------------------------------------------- [place] Details Patient Name: [name], MAGEE. Date of Service: 06/23/2020 11:00 AM Medical Record Number: [id number] Patient Account Number: [id number] Date of Birth/Sex: [birth year] (87 y.o. F) Treating RN: Cornell Barman Primary Care Burlon Centrella: Ria Bush Other Clinician: Jeanine Luz Referring Natoshia Souter: Ria Bush Treating Jernee Murtaugh/Extender: Skipper Cliche in Treatment: 66 Active Inactive Abuse / Safety / Falls / Self Care Management Nursing Diagnoses: Potential for falls Goals: Patient/caregiver will verbalize understanding of skin care regimen Date Initiated: 10/12/2019 Target Resolution Date: 07/10/2020 Goal Status: Active Interventions: Assess fall risk on admission and as needed Notes: Electronic Signature(s) Signed: 06/23/2020 5:20:30 PM By: Gretta Cool, BSN, RN, CWS, Kim RN, BSN Entered  By: Gretta Cool, BSN, RN, CWS, Kim on 06/23/2020 12:03:28 [name] ([id number]) -------------------------------------------------------------------------------- Pain Assessment Details Patient Name: [name], HERBER. Date of Service: 06/23/2020 11:00 AM Medical Record Number: [id number] Patient Account Number: [id number] Date of Birth/Sex: [birth year] (87 y.o. F) Treating RN: Carlene Coria Primary Care Zakyra Kukuk: Ria Bush Other Clinician: Jeanine Luz Referring Mersadie Kavanaugh: Ria Bush Treating Orvella Digiulio/Extender: Skipper Cliche in Treatment: 36 Active Problems Location of Pain Severity and Description of Pain Patient Has Paino No Site Locations Pain Management and Medication Current Pain Management: Electronic Signature(s) Signed: 06/23/2020 2:55:19 PM By: Jeanine Luz Signed: 07/14/2020 8:11:40 AM By: Carlene Coria RN Entered By: Jeanine Luz on 06/23/2020 11:44:22 [name] ([id number]) -------------------------------------------------------------------------------- Patient/Caregiver Education Details Patient Name: [name] Date of Service: 06/23/2020 11:00 AM Medical Record Number: [id number] Patient Account Number: [id number] Date of Birth/Gender: [birth year] (87 y.o. F) Treating RN: Cornell Barman Primary Care Physician: Ria Bush Other Clinician: Jeanine Luz Referring Physician: Ria Bush Treating Physician/Extender: Skipper Cliche in Treatment: 41 Education Assessment Education Provided To: Patient Education Topics Provided Wound/Skin Impairment: Handouts: Caring for Your Ulcer Methods: Demonstration, Explain/Verbal Responses: State content correctly Electronic Signature(s) Signed: 06/23/2020 5:20:30 PM By: Gretta Cool, BSN, RN, CWS, Kim RN, BSN Entered By: Gretta Cool, BSN, RN, CWS, Kim on 06/23/2020 12:09:28 [name] ([id number]) -------------------------------------------------------------------------------- Wound  Assessment Details Patient Name: [name], [name]. Date of Service: 06/23/2020 11:00 AM Medical Record Number: [id number] Patient Account Number: [id number] Date of Birth/Sex: 12/20/1929 (87 y.o. F) Treating RN: Carlene Coria Primary Care Renly Roots: Ria Bush Other Clinician: Jeanine Luz Referring Wacey Zieger: Ria Bush Treating Lanny Lipkin/Extender: Skipper Cliche in Treatment: 36 Wound Status Wound Number: 3 Primary Etiology: Arterial Insufficiency Ulcer Wound Location: Right Metatarsal head fifth Wound Status: Open Wounding Event: Gradually Appeared Comorbid History: Arrhythmia, Osteoarthritis Date Acquired: 04/09/2019 Weeks Of Treatment: 36 Clustered Wound: No Photos Wound Measurements Length: (cm) 0.1 Width: (cm) 0.1 Depth: (cm) 0.1 Area: (cm) 0.008 Volume: (cm) 0.001 % Reduction in Area: 99.3% % Reduction in Volume: 99.1% Epithelialization: Small (1-33%) Tunneling: No Undermining: No Wound Description Classification: Full Thickness Without Exposed Support Structure Wound Margin: Distinct, outline attached Exudate Amount: None Present s Foul Odor After Cleansing: No Slough/Fibrino No Wound Bed Granulation Amount: Small (1-33%) Exposed Structure Granulation Quality: Pink Fascia Exposed: No Necrotic Amount: None Present (0%) Fat Layer (Subcutaneous Tissue) Exposed: No Tendon Exposed: No Muscle Exposed: No Joint Exposed: No Bone Exposed: No Electronic Signature(s) Signed: 06/23/2020 2:55:19  PM By: Jeanine Luz Signed: 07/14/2020 8:11:40 AM By: Carlene Coria RN Entered By: Jeanine Luz on 06/23/2020 11:51:55 [name] ([id number]) -------------------------------------------------------------------------------- Vitals Details Patient Name: [name] Date of Service: 06/23/2020 11:00 AM Medical Record Number: [id number] Patient Account Number: [id number] Date of Birth/Sex: 1929/12/07 (87 y.o. F) Treating RN: Carlene Coria Primary  Care Jaivyn Gulla: Ria Bush Other Clinician: Jeanine Luz Referring Janylah Belgrave: Ria Bush Treating Sigmond Patalano/Extender: Skipper Cliche in Treatment: 36 Vital Signs Time Taken: 11:40 Temperature (F): 98.0 Height (in): 62 Pulse (bpm): 76 Weight (lbs): 140 Respiratory Rate (breaths/min): 18 Body Mass Index (BMI): 25.6 Blood Pressure (mmHg): 152/89 Reference Range: 80 - 120 mg / dl Electronic Signature(s) Signed: 06/23/2020 2:55:19 PM By: Jeanine Luz Entered By: Jeanine Luz on 06/23/2020 11:44:09

## 2020-06-23 NOTE — Progress Notes (Addendum)
[name], [name] ([id number]) Visit Report for 06/23/2020 Chief Complaint Document Details Patient Name: [name], [name]. Date of Service: 06/23/2020 11:00 AM Medical Record Number: [id number] Patient Account Number: [id number] Date of Birth/Sex: November 24, 1929 (87 y.o. F) Treating RN: Carlene Coria Primary Care Provider: Ria Bush Other Clinician: Jeanine Luz Referring Provider: Ria Bush Treating Provider/Extender: Skipper Cliche in Treatment: 36 Information Obtained from: Patient Chief Complaint Right Lateral Foot Ulcer Electronic Signature(s) Signed: 06/23/2020 11:34:00 AM By: Worthy Keeler PA-C Entered By: Worthy Keeler on 06/23/2020 11:34:00 [name] ([id number]) -------------------------------------------------------------------------------- HPI Details Patient Name: [name] Date of Service: 06/23/2020 11:00 AM Medical Record Number: [id number] Patient Account Number: [id number] Date of Birth/Sex: 23-Mar-1930 (87 y.o. F) Treating RN: Carlene Coria Primary Care Provider: Ria Bush Other Clinician: Jeanine Luz Referring Provider: Ria Bush Treating Provider/Extender: Skipper Cliche in Treatment: 36 History of Present Illness HPI Description: 10/12/2019 upon evaluation today patient appears to be doing somewhat poorly in regard to her wound at this point. She has a ulcer over the right fifth metatarsal region. This has been managed by podiatry up to this point. Unfortunately the patient just does not seem to be making the progress that they were hoping for an subsequently the patient was referred to [place] by [facility] Dr. Posey Pronto. With that being said I did note that the patient does have peripheral vascular disease which has been managed by Dr. Lazaro Arms her most recent angiogram with intervention was on 08/02/2019. The ABIs following were on the right 1.30 with a TBI of 0.61 normal left 1.31 with a TBI of 0.58.  Obviously things seem to be doing somewhat better at this point hopefully enough to allow her to heal. She is on anticoagulant therapy due to atrial fibrillation long-term. With that being said the patient is unfortunately having quite a bit of discomfort at this time secondary to the wound. She also has erythema which is very likely secondary to infection. I am going obtain a culture of the area today once debridement is complete. Apparently the patient also had a x-ray that was performed by Dr. Posey Pronto on 09/21/2019 and this showed that the patient had no obvious signs on x-ray of cortical irregularity and no osteomyelitis obviously noted. With that being said as I discussed with the patient's daughter as well this does not indicate that the patient absolutely has no osteomyelitis as x-rays are only at best 50% helpful in identifying this complication. Nonetheless I do believe that we need to have the patient continue to have this further evaluated specifically I am to recommend that we check into an MRI which I think would be ideal to evaluate whether or not there is osteomyelitis present. 7/16; this is a patient with an ulcer over the right fifth metatarsal head. She follows with Dr. Lucky Cowboy [facility]. She is due to have an MRI of the foot on 7/26. Using Santyl to the wound 11/04/2019 on evaluation today patient appears to be doing okay in regard to her wound at this time. Unfortunately since I last saw her which was actually her second visit here in the clinic she has moved into a new apartment and then subsequently had a fall. She ended up in the hospital she did fracture her left arm and subsequently has also been dealing with a lot of dizziness she has been at [employer]. She tells me that getting in the change the dressing has been somewhat difficult as  far as daily dressing changes are concerned with the Santyl. Nonetheless when they do change it that is what is been used and her  daughter-in-law has also been coming in to help out as well with the dressing changes to try to make sure it gets done daily. Fortunately there is no signs of active infection at this time. No fever chills noted. I do believe the Santyl has been beneficial and is likely still the best way to go at this point for the patient. I did review the patient's MRI which showed some potential for reactive osteitis but did not show any signs of osteomyelitis overtly. That cannot be excluded but again right now it does not appear that is very likely present as well. This is good news. I also did review her culture she has been on the Bactrim she tells me she has taken this and again that was appropriate based on the results of her culture. 11/19/2019 upon evaluation today patient appears to be doing decently well in regard to her wound. Fortunately there is no signs of active infection at this time wound is not significantly larger in fact is roughly about the same at this point. With that being said there is not as much erythema as previously noted I think the infection seems to be under much better control which is great news. Unfortunately she has fallen since I last saw her and broken her left arm she has an appointment with them following the appointment with me. 8/25; patient was worked in acutely today at the request of home health and was concerned about her right heel, generalized erythema in her feet bilaterally. According to her daughter things have been getting worse over the last for 5 days but not precisely from a wound care point of view. Her wound is on the lateral part of the right fifth metatarsal head looks about the same as I remember this. She has developed dryness and flaking of the skin on her plantar feet. As well a very tender area localized over the insertion point of the Achilles tendon. She has restless leg syndrome. Does not specifically offload the area at night. She wears socks at  home and does not really give me a pressure on the right heel issue. I reviewed her vascular status. She underwent her last revascularization on 08/02/2019 by Dr. Lucky Cowboy. This included an angioplasty of the right peroneal artery as well as the right posterior tibial artery. Her last noninvasive arterial study was on 11/23/2019. On the right her ABI was 1.08 TBI of 0.48. On the left ABI at 1.21 with a TBI of 0.54. Family relates that Dr. Lucky Cowboy felt that he had done the best he could and this. We have been using Santyl to the one wound she has on the right lateral fifth metatarsal head. She does not have a new wound today which is the concern that letter coming into the clinic 12/09/2019 on evaluation today patient appears to be doing decently well in regard to her foot ulcer at this point. She does note that with the compression wrap that the home health nurse put on after contacting our clinic that she actually did much better and felt much better. Her heel is also improved. Fortunately there is no signs of active infection at this time. No fevers, chills, nausea, vomiting, or diarrhea. 12/24/2019 upon evaluation today patient appears to be doing somewhat poorly still in regard to her foot. Unfortunately this is just not showing the signs  of improvement that I would like to see it is a little bit cleaner but also appears to be erythematous and I believe there is infection noted at this point. We will get a need to address this in my opinion. I did actually recommend that we switch back to the Iodoflex as well apparently the home health nurse had switch this away that not really what we want to do at this point in my opinion. They were using silver alginate I think most recently. 01/06/2020 upon evaluation today patient actually appears to be doing much better in regard to her wound. We have switched back to the silver alginate after her home health nurse contacted me and honestly I feel like she is doing quite  well in that regard. She does have a lot of drainage and therefore I think that is probably a better option for. With that being said she is tolerating the compression wrap without complication that also seems to be helping. 01/28/2020 on evaluation today patient appears to be doing well with regard to her foot ulcer. I do feel like she is showing signs of improvement which is great news. There is no signs of active infection at this time which is also good news. No fevers, chills, nausea, vomiting, or diarrhea. Unfortunately she did have a fall and sustained an injury to the left upper arm. She has a skin tear here this appears to be doing okay although [name], [name]. ([id number]) the dressing was stuck to this this was an alginate dressing. I really think she probably needs something like Xeroform to try to prevent anything from sticking to this region. Other than that she seems to be doing quite well. 02/11/2020 on evaluation today patient's arm skin tear appears to be completely healed which is great. Unfortunately she is still having issues at this time with her foot although this seems to be showing signs of improvement it still very slow. There is no signs of active infection at this time. 03/06/2020 upon evaluation today patient appears to be doing decently well in regard to her foot ulcer. There is some minimal slough noted on the surface of the wound building up at this point. Otherwise I really feel like she is doing quite nicely with quite a bit of new granulation and epithelial growth. 03/23/2020 upon evaluation today patient appears to be doing well with regards to her wound. She has been tolerating the dressing changes without complication. Fortunately there is no evidence of active infection and overall feel like her foot ulcer is doing excellent at this point. 04/13/2020 on evaluation today patient appears to be doing well with regard to her foot ulcer. She has been tolerating the  dressing changes without complication. Fortunately there is no signs of active infection at this time. Overall I feel like the wound is getting close to complete closure and this appears to be doing quite well in my opinion. 04/27/2020 on evaluation today patient appears to be doing excellent in regard to her wound. She has been tolerating the dressing changes without complication. Fortunately there is no signs of active infection and overall very pleased with where things stand today. In fact I feel like the wound is very close to complete resolution. 05/18/2020 upon evaluation today patient appears to be doing well with regard to her foot ulcer. There is no evidence of active infection at this time which is great news and overall very pleased with where things stand. No fevers, chills, nausea, vomiting, or  diarrhea. I do feel like she is making great progress and I feel like that the wound is very tiny but again this last portion getting close has been somewhat difficult. 07/05/2020 upon evaluation today patient appears to be doing a little poorly in regard to her foot ulcer. Fortunately there is no signs of active infection at this time systemically though locally I feel it may be some cellulitis it looks like this wound may have prematurely sealed up subsequently causing a blister that had to be removed. The good news is that I do not think it is a very bad infection but I believe she may be experiencing cellulitis. 06/16/2020 upon evaluation today patient appears to be doing well with regard to her wound. It seems like it showed signs of improvement compared to last week where she had more infection noted. With that being said I do believe that based on what I am seeing the patient seems to be making excellent progress although I think still this is a small area that is going require some debridement to clear away some of the callus and drainage which is trapping fluid behind it based on what I  see. 06/23/2020 upon evaluation today patient appears to be doing well with regard to her leg ulcer. She has been tolerating the dressing changes without application. And currently I do believe the alginate is doing a good job keeping it dry it still tends to cover over and we do not want her trapping any fluid therefore I do remove that outer callus/drainage covering each time she comes in. Overall I think this is doing a great job. Electronic Signature(s) Signed: 06/23/2020 1:57:35 PM By: Worthy Keeler PA-C Entered By: Worthy Keeler on 06/23/2020 13:57:35 [name], [name] ([id number]) -------------------------------------------------------------------------------- Physical Exam Details Patient Name: [name], [name]. Date of Service: 06/23/2020 11:00 AM Medical Record Number: [id number] Patient Account Number: [id number] Date of Birth/Sex: 1929/11/10 (87 y.o. F) Treating RN: Carlene Coria Primary Care Provider: Ria Bush Other Clinician: Jeanine Luz Referring Provider: Ria Bush Treating Provider/Extender: Skipper Cliche in Treatment: 63 Constitutional Well-nourished and well-hydrated in no acute distress. Respiratory normal breathing without difficulty. Psychiatric this patient is able to make decisions and demonstrates good insight into disease process. Alert and Oriented x 3. pleasant and cooperative. Notes 1. Patient's wound bed did require mild sharp debridement to remove callus and some dry drainage from the surface of the wound. There is really not much open underneath which is great news. There is no signs of infection she is having no pain. Electronic Signature(s) Signed: 06/23/2020 1:58:15 PM By: Worthy Keeler PA-C Entered By: Worthy Keeler on 06/23/2020 13:58:15 [name], [name] ([id number]) -------------------------------------------------------------------------------- Physician Orders Details Patient Name: [name] Date of Service:  06/23/2020 11:00 AM Medical Record Number: [id number] Patient Account Number: [id number] Date of Birth/Sex: 09/08/1929 (87 y.o. F) Treating RN: Cornell Barman Primary Care Provider: Ria Bush Other Clinician: Jeanine Luz Referring Provider: Ria Bush Treating Provider/Extender: Skipper Cliche in Treatment: 56 Verbal / Phone Orders: No Diagnosis Coding ICD-10 Coding Code Description I73.89 Other specified peripheral vascular diseases L97.512 Non-pressure chronic ulcer of other part of right foot with fat layer exposed S41.102A Unspecified open wound of left upper arm, initial encounter Z79.01 Long term (current) use of anticoagulants I48.0 Paroxysmal atrial fibrillation B35.3 Tinea pedis Follow-up Appointments o Return Appointment in 1 week. [place]: - [place] for wound care. May utilize formulary equivalent  dressing for wound treatment orders unless otherwise specified. Home Health Nurse may visit PRN to address patientos wound care needs. o Scheduled days for dressing changes to be completed; exception, patient has scheduled wound care visit that day. o **Please direct any NON-WOUND related issues/requests for orders to patient's Primary Care Physician. **If current dressing causes regression in wound condition, may D/C ordered dressing product/s and apply Normal Saline Moist Dressing daily until next [place] or Other MD appointment. **Notify [facility] of regression in wound condition at [phone]. Wound Treatment Wound #3 - Metatarsal head fifth Wound Laterality: Right Cleanser: Normal Saline (Generic) 2 x Per Week/30 Days Discharge Instructions: Wash your hands with soap and water. Remove old dressing, discard into plastic bag and place into trash. Cleanse the wound with Normal Saline prior to applying a clean dressing using gauze sponges, not tissues or cotton balls. Do not  scrub or use excessive force. Pat dry using gauze sponges, not tissue or cotton balls. Primary Dressing: Silvercel Small 2x2 (in/in) 2 x Per Week/30 Days Discharge Instructions: Apply Silvercel Small 2x2 (in/in) as instructed Secondary Dressing: Mepilex Border Flex, 4x4 (in/in) (Generic) 2 x Per Week/30 Days Discharge Instructions: Apply to wound as directed. Do not cut. Electronic Signature(s) Signed: 06/23/2020 4:40:31 PM By: Worthy Keeler PA-C Signed: 06/23/2020 5:20:30 PM By: Gretta Cool, BSN, RN, CWS, Kim RN, BSN Entered By: Gretta Cool, BSN, RN, CWS, Kim on 06/23/2020 12:08:11 [name], [name] ([id number]) -------------------------------------------------------------------------------- Problem List Details Patient Name: [name], [name]. Date of Service: 06/23/2020 11:00 AM Medical Record Number: [id number] Patient Account Number: [id number] Date of Birth/Sex: Apr 03, 1930 (87 y.o. F) Treating RN: Carlene Coria Primary Care Provider: Ria Bush Other Clinician: Jeanine Luz Referring Provider: Ria Bush Treating Provider/Extender: Skipper Cliche in Treatment: 36 Active Problems ICD-10 Encounter Code Description Active Date MDM Diagnosis I73.89 Other specified peripheral vascular diseases 10/12/2019 No Yes L97.512 Non-pressure chronic ulcer of other part of right foot with fat layer 10/12/2019 No Yes exposed S41.102A Unspecified open wound of left upper arm, initial encounter 01/28/2020 No Yes Z79.01 Long term (current) use of anticoagulants 10/12/2019 No Yes I48.0 Paroxysmal atrial fibrillation 10/12/2019 No Yes B35.3 Tinea pedis 12/01/2019 No Yes Inactive Problems Resolved Problems Electronic Signature(s) Signed: 06/23/2020 11:33:54 AM By: Worthy Keeler PA-C Entered By: Worthy Keeler on 06/23/2020 11:33:54 [name] ([id number]) -------------------------------------------------------------------------------- Progress Note Details Patient Name: [name]. Date of Service: 06/23/2020 11:00 AM Medical Record Number: [id number] Patient Account Number: [id number] Date of Birth/Sex: March 14, 1930 (87 y.o. F) Treating RN: Carlene Coria Primary Care Provider: Ria Bush Other Clinician: Jeanine Luz Referring Provider: Ria Bush Treating Provider/Extender: Skipper Cliche in Treatment: 36 Subjective Chief Complaint Information obtained from Patient Right Lateral Foot Ulcer History of Present Illness (HPI) 10/12/2019 upon evaluation today patient appears to be doing somewhat poorly in regard to her wound at this point. She has a ulcer over the right fifth metatarsal region. This has been managed by podiatry up to this point. Unfortunately the patient just does not seem to be making the progress that they were hoping for an subsequently the patient was referred to [place] by [facility] Dr. Posey Pronto. With that being said I did note that the patient does have peripheral vascular disease which has been managed by Dr. Lazaro Arms her most recent angiogram with intervention was on 08/02/2019. The ABIs following were on the right 1.30 with a TBI of 0.61 normal left 1.31 with a TBI of 0.58.  Obviously things seem to be doing somewhat better at this point hopefully enough to allow her to heal. She is on anticoagulant therapy due to atrial fibrillation long-term. With that being said the patient is unfortunately having quite a bit of discomfort at this time secondary to the wound. She also has erythema which is very likely secondary to infection. I am going obtain a culture of the area today once debridement is complete. Apparently the patient also had a x-ray that was performed by Dr. Posey Pronto on 09/21/2019 and this showed that the patient had no obvious signs on x-ray of cortical irregularity and no osteomyelitis obviously noted. With that being said as I discussed with the patient's daughter as well this does not indicate that the patient absolutely  has no osteomyelitis as x-rays are only at best 50% helpful in identifying this complication. Nonetheless I do believe that we need to have the patient continue to have this further evaluated specifically I am to recommend that we check into an MRI which I think would be ideal to evaluate whether or not there is osteomyelitis present. 7/16; this is a patient with an ulcer over the right fifth metatarsal head. She follows with Dr. Lucky Cowboy at [facility]. She is due to have an MRI of the foot on 7/26. Using Santyl to the wound 11/04/2019 on evaluation today patient appears to be doing okay in regard to her wound at this time. Unfortunately since I last saw her which was actually her second visit here in the clinic she has moved into a new apartment and then subsequently had a fall. She ended up in the hospital she did fracture her left arm and subsequently has also been dealing with a lot of dizziness she has been at [employer]. She tells me that getting in the change the dressing has been somewhat difficult as far as daily dressing changes are concerned with the Santyl. Nonetheless when they do change it that is what is been used and her daughter-in-law has also been coming in to help out as well with the dressing changes to try to make sure it gets done daily. Fortunately there is no signs of active infection at this time. No fever chills noted. I do believe the Santyl has been beneficial and is likely still the best way to go at this point for the patient. I did review the patient's MRI which showed some potential for reactive osteitis but did not show any signs of osteomyelitis overtly. That cannot be excluded but again right now it does not appear that is very likely present as well. This is good news. I also did review her culture she has been on the Bactrim she tells me she has taken this and again that was appropriate based on the results of her culture. 11/19/2019 upon evaluation today  patient appears to be doing decently well in regard to her wound. Fortunately there is no signs of active infection at this time wound is not significantly larger in fact is roughly about the same at this point. With that being said there is not as much erythema as previously noted I think the infection seems to be under much better control which is great news. Unfortunately she has fallen since I last saw her and broken her left arm she has an appointment with them following the appointment with me. 8/25; patient was worked in acutely today at the request of home health and was concerned about her right heel, generalized erythema  in her feet bilaterally. According to her daughter things have been getting worse over the last for 5 days but not precisely from a wound care point of view. Her wound is on the lateral part of the right fifth metatarsal head looks about the same as I remember this. She has developed dryness and flaking of the skin on her plantar feet. As well a very tender area localized over the insertion point of the Achilles tendon. She has restless leg syndrome. Does not specifically offload the area at night. She wears socks at home and does not really give me a pressure on the right heel issue. I reviewed her vascular status. She underwent her last revascularization on 08/02/2019 by Dr. Lucky Cowboy. This included an angioplasty of the right peroneal artery as well as the right posterior tibial artery. Her last noninvasive arterial study was on 11/23/2019. On the right her ABI was 1.08 TBI of 0.48. On the left ABI at 1.21 with a TBI of 0.54. Family relates that Dr. Lucky Cowboy felt that he had done the best he could and this. We have been using Santyl to the one wound she has on the right lateral fifth metatarsal head. She does not have a new wound today which is the concern that letter coming into the clinic 12/09/2019 on evaluation today patient appears to be doing decently well in regard to her foot  ulcer at this point. She does note that with the compression wrap that the home health nurse put on after contacting our clinic that she actually did much better and felt much better. Her heel is also improved. Fortunately there is no signs of active infection at this time. No fevers, chills, nausea, vomiting, or diarrhea. 12/24/2019 upon evaluation today patient appears to be doing somewhat poorly still in regard to her foot. Unfortunately this is just not showing the signs of improvement that I would like to see it is a little bit cleaner but also appears to be erythematous and I believe there is infection noted at this point. We will get a need to address this in my opinion. I did actually recommend that we switch back to the Iodoflex as well apparently the home health nurse had switch this away that not really what we want to do at this point in my opinion. They were using silver alginate I think most recently. 01/06/2020 upon evaluation today patient actually appears to be doing much better in regard to her wound. We have switched back to the silver alginate after her home health nurse contacted me and honestly I feel like she is doing quite well in that regard. She does have a lot of drainage and therefore I think that is probably a better option for. With that being said she is tolerating the compression wrap without complication that [name], [name]. ([id number]) also seems to be helping. 01/28/2020 on evaluation today patient appears to be doing well with regard to her foot ulcer. I do feel like she is showing signs of improvement which is great news. There is no signs of active infection at this time which is also good news. No fevers, chills, nausea, vomiting, or diarrhea. Unfortunately she did have a fall and sustained an injury to the left upper arm. She has a skin tear here this appears to be doing okay although the dressing was stuck to this this was an alginate dressing. I really think  she probably needs something like Xeroform to try to prevent anything from sticking to  this region. Other than that she seems to be doing quite well. 02/11/2020 on evaluation today patient's arm skin tear appears to be completely healed which is great. Unfortunately she is still having issues at this time with her foot although this seems to be showing signs of improvement it still very slow. There is no signs of active infection at this time. 03/06/2020 upon evaluation today patient appears to be doing decently well in regard to her foot ulcer. There is some minimal slough noted on the surface of the wound building up at this point. Otherwise I really feel like she is doing quite nicely with quite a bit of new granulation and epithelial growth. 03/23/2020 upon evaluation today patient appears to be doing well with regards to her wound. She has been tolerating the dressing changes without complication. Fortunately there is no evidence of active infection and overall feel like her foot ulcer is doing excellent at this point. 04/13/2020 on evaluation today patient appears to be doing well with regard to her foot ulcer. She has been tolerating the dressing changes without complication. Fortunately there is no signs of active infection at this time. Overall I feel like the wound is getting close to complete closure and this appears to be doing quite well in my opinion. 04/27/2020 on evaluation today patient appears to be doing excellent in regard to her wound. She has been tolerating the dressing changes without complication. Fortunately there is no signs of active infection and overall very pleased with where things stand today. In fact I feel like the wound is very close to complete resolution. 05/18/2020 upon evaluation today patient appears to be doing well with regard to her foot ulcer. There is no evidence of active infection at this time which is great news and overall very pleased with where things  stand. No fevers, chills, nausea, vomiting, or diarrhea. I do feel like she is making great progress and I feel like that the wound is very tiny but again this last portion getting close has been somewhat difficult. 07/05/2020 upon evaluation today patient appears to be doing a little poorly in regard to her foot ulcer. Fortunately there is no signs of active infection at this time systemically though locally I feel it may be some cellulitis it looks like this wound may have prematurely sealed up subsequently causing a blister that had to be removed. The good news is that I do not think it is a very bad infection but I believe she may be experiencing cellulitis. 06/16/2020 upon evaluation today patient appears to be doing well with regard to her wound. It seems like it showed signs of improvement compared to last week where she had more infection noted. With that being said I do believe that based on what I am seeing the patient seems to be making excellent progress although I think still this is a small area that is going require some debridement to clear away some of the callus and drainage which is trapping fluid behind it based on what I see. 06/23/2020 upon evaluation today patient appears to be doing well with regard to her leg ulcer. She has been tolerating the dressing changes without application. And currently I do believe the alginate is doing a good job keeping it dry it still tends to cover over and we do not want her trapping any fluid therefore I do remove that outer callus/drainage covering each time she comes in. Overall I think this is doing a great job. Objective  Constitutional Well-nourished and well-hydrated in no acute distress. Vitals Time Taken: 11:40 AM, Height: 62 in, Weight: 140 lbs, BMI: 25.6, Temperature: 98.0 F, Pulse: 76 bpm, Respiratory Rate: 18 breaths/min, Blood Pressure: 152/89 mmHg. Respiratory normal breathing without difficulty. Psychiatric this patient is  able to make decisions and demonstrates good insight into disease process. Alert and Oriented x 3. pleasant and cooperative. General Notes: 1. Patient's wound bed did require mild sharp debridement to remove callus and some dry drainage from the surface of the wound. There is really not much open underneath which is great news. There is no signs of infection she is having no pain. Integumentary (Hair, Skin) Wound #3 status is Open. Original cause of wound was Gradually Appeared. The date acquired was: 04/09/2019. The wound has been in treatment 36 weeks. The wound is located on the Right Metatarsal head fifth. The wound measures 0.1cm length x 0.1cm width x 0.1cm depth; 0.008cm^2 area and 0.001cm^3 volume. There is no tunneling or undermining noted. There is a none present amount of drainage noted. The wound margin is distinct with the outline attached to the wound base. There is small (1-33%) pink granulation within the wound bed. There is no necrotic tissue within the wound bed. [name], [name] ([id number]) Assessment Active Problems ICD-10 Other specified peripheral vascular diseases Non-pressure chronic ulcer of other part of right foot with fat layer exposed Unspecified open wound of left upper arm, initial encounter Long term (current) use of anticoagulants Paroxysmal atrial fibrillation Tinea pedis Plan Follow-up Appointments: Return Appointment in 1 week. Home Health: [facility]: - [place] for wound care. May utilize formulary equivalent dressing for wound treatment orders unless otherwise specified. Home Health Nurse may visit PRN to address patient s wound care needs. Scheduled days for dressing changes to be completed; exception, patient has scheduled wound care visit that day. **Please direct any NON-WOUND related issues/requests for orders to patient's Primary Care Physician. **If current dressing causes regression in wound condition, may D/C  ordered dressing product/s and apply Normal Saline Moist Dressing daily until next [place] or Other MD appointment. **Notify [facility] of regression in wound condition at [phone]. WOUND #3: - Metatarsal head fifth Wound Laterality: Right Cleanser: Normal Saline (Generic) 2 x Per Week/30 Days Discharge Instructions: Wash your hands with soap and water. Remove old dressing, discard into plastic bag and place into trash. Cleanse the wound with Normal Saline prior to applying a clean dressing using gauze sponges, not tissues or cotton balls. Do not scrub or use excessive force. Pat dry using gauze sponges, not tissue or cotton balls. Primary Dressing: Silvercel Small 2x2 (in/in) 2 x Per Week/30 Days Discharge Instructions: Apply Silvercel Small 2x2 (in/in) as instructed Secondary Dressing: Mepilex Border Flex, 4x4 (in/in) (Generic) 2 x Per Week/30 Days Discharge Instructions: Apply to wound as directed. Do not cut. 1. Would recommend currently that we continue the wound care measures as before and the patient is in agreement with the plan that includes the use of the silver alginate dressing which I think is doing a good job. 2. We will also continue to cover with a large Band-Aid. 3. I am also can recommend she continue to monitor for any signs of worsening infection. If anything occurs they should let me know soon as possible. We will see patient back for reevaluation in 1 week here in the clinic. If anything worsens or changes patient will contact our office for additional recommendations. Electronic Signature(s) Signed:  06/23/2020 1:58:53 PM By: Worthy Keeler PA-C Entered By: Worthy Keeler on 06/23/2020 13:58:53 [name] ([id number]) -------------------------------------------------------------------------------- SuperBill Details Patient Name: [name] Date of Service: 06/23/2020 Medical Record Number: [id number] Patient Account Number:  [id number] Date of Birth/Sex: 12/20/1929 (87 y.o. F) Treating RN: Carlene Coria Primary Care Provider: Ria Bush Other Clinician: Jeanine Luz Referring Provider: Ria Bush Treating Provider/Extender: Skipper Cliche in Treatment: 36 Diagnosis Coding ICD-10 Codes Code Description I73.89 Other specified peripheral vascular diseases L97.512 Non-pressure chronic ulcer of other part of right foot with fat layer exposed S41.102A Unspecified open wound of left upper arm, initial encounter Z79.01 Long term (current) use of anticoagulants I48.0 Paroxysmal atrial fibrillation B35.3 Tinea pedis Facility Procedures CPT4 Code: 99872158 Description: 99213 - WOUND CARE VISIT-LEV 3 EST PT Modifier: Quantity: 1 Physician Procedures CPT4 Code: 7276184 Description: 85927 - WC PHYS LEVEL 3 - EST PT Modifier: Quantity: 1 CPT4 Code: Description: ICD-10 Diagnosis Description I73.89 Other specified peripheral vascular diseases L97.512 Non-pressure chronic ulcer of other part of right foot with fat layer e S41.102A Unspecified open wound of left upper arm, initial encounter Z79.01 Long  term (current) use of anticoagulants Modifier: xposed Quantity: Electronic Signature(s) Signed: 06/23/2020 1:59:09 PM By: Worthy Keeler PA-C Entered By: Worthy Keeler on 06/23/2020 13:59:08

## 2020-06-26 DIAGNOSIS — Z7982 Long term (current) use of aspirin: Secondary | ICD-10-CM | POA: Diagnosis not present

## 2020-06-26 DIAGNOSIS — Z792 Long term (current) use of antibiotics: Secondary | ICD-10-CM | POA: Diagnosis not present

## 2020-06-26 DIAGNOSIS — E538 Deficiency of other specified B group vitamins: Secondary | ICD-10-CM | POA: Diagnosis not present

## 2020-06-26 DIAGNOSIS — I272 Pulmonary hypertension, unspecified: Secondary | ICD-10-CM | POA: Diagnosis not present

## 2020-06-26 DIAGNOSIS — L97513 Non-pressure chronic ulcer of other part of right foot with necrosis of muscle: Secondary | ICD-10-CM | POA: Diagnosis not present

## 2020-06-26 DIAGNOSIS — M5136 Other intervertebral disc degeneration, lumbar region: Secondary | ICD-10-CM | POA: Diagnosis not present

## 2020-06-26 DIAGNOSIS — I872 Venous insufficiency (chronic) (peripheral): Secondary | ICD-10-CM | POA: Diagnosis not present

## 2020-06-26 DIAGNOSIS — G2581 Restless legs syndrome: Secondary | ICD-10-CM | POA: Diagnosis not present

## 2020-06-26 DIAGNOSIS — I4891 Unspecified atrial fibrillation: Secondary | ICD-10-CM | POA: Diagnosis not present

## 2020-06-26 DIAGNOSIS — E559 Vitamin D deficiency, unspecified: Secondary | ICD-10-CM | POA: Diagnosis not present

## 2020-06-26 DIAGNOSIS — N1831 Chronic kidney disease, stage 3a: Secondary | ICD-10-CM | POA: Diagnosis not present

## 2020-06-26 DIAGNOSIS — Z9181 History of falling: Secondary | ICD-10-CM | POA: Diagnosis not present

## 2020-06-26 DIAGNOSIS — M810 Age-related osteoporosis without current pathological fracture: Secondary | ICD-10-CM | POA: Diagnosis not present

## 2020-06-26 DIAGNOSIS — I5032 Chronic diastolic (congestive) heart failure: Secondary | ICD-10-CM | POA: Diagnosis not present

## 2020-06-26 DIAGNOSIS — K219 Gastro-esophageal reflux disease without esophagitis: Secondary | ICD-10-CM | POA: Diagnosis not present

## 2020-06-26 DIAGNOSIS — M1990 Unspecified osteoarthritis, unspecified site: Secondary | ICD-10-CM | POA: Diagnosis not present

## 2020-06-26 DIAGNOSIS — S62102D Fracture of unspecified carpal bone, left wrist, subsequent encounter for fracture with routine healing: Secondary | ICD-10-CM | POA: Diagnosis not present

## 2020-06-26 DIAGNOSIS — E7849 Other hyperlipidemia: Secondary | ICD-10-CM | POA: Diagnosis not present

## 2020-06-26 DIAGNOSIS — I2510 Atherosclerotic heart disease of native coronary artery without angina pectoris: Secondary | ICD-10-CM | POA: Diagnosis not present

## 2020-06-26 DIAGNOSIS — I130 Hypertensive heart and chronic kidney disease with heart failure and stage 1 through stage 4 chronic kidney disease, or unspecified chronic kidney disease: Secondary | ICD-10-CM | POA: Diagnosis not present

## 2020-06-26 DIAGNOSIS — Z7901 Long term (current) use of anticoagulants: Secondary | ICD-10-CM | POA: Diagnosis not present

## 2020-06-26 DIAGNOSIS — G9009 Other idiopathic peripheral autonomic neuropathy: Secondary | ICD-10-CM | POA: Diagnosis not present

## 2020-06-27 ENCOUNTER — Telehealth: Payer: Self-pay | Admitting: Pharmacy Technician

## 2020-06-27 DIAGNOSIS — D649 Anemia, unspecified: Secondary | ICD-10-CM | POA: Insufficient documentation

## 2020-06-27 DIAGNOSIS — D509 Iron deficiency anemia, unspecified: Secondary | ICD-10-CM | POA: Insufficient documentation

## 2020-06-27 NOTE — Assessment & Plan Note (Signed)
Chronic, severe. Gabapentin dosing limited by unsteadiness.

## 2020-06-27 NOTE — Assessment & Plan Note (Signed)
Continue Q36mo prolia injection - next due later this month.

## 2020-06-27 NOTE — Assessment & Plan Note (Signed)

## 2020-06-27 NOTE — Assessment & Plan Note (Signed)
Worsening despite requip 2.5mg  nightly, tramadol, tylenol. Also on tizanidine with benefit. Check iron panel, if low consider iron infusion.  Has dropped gabapentin to 300mg  once daily due to worsening unsteadiness.

## 2020-06-27 NOTE — Assessment & Plan Note (Addendum)
Will continue to monitor on namenda.  Overall stable period on memory testing today.

## 2020-06-27 NOTE — Assessment & Plan Note (Signed)
Chronic, stable on atorvastatin.  The ASCVD Risk score Mikey Bussing DC Jr., et al., 2013) failed to calculate for the following reasons:   The 2013 ASCVD risk score is only valid for ages 60 to 52

## 2020-06-27 NOTE — Assessment & Plan Note (Addendum)
Continue miralax, senna PRN, enema PRN. Continue good water and fiber intake.  Avoid oral iron.

## 2020-06-27 NOTE — Assessment & Plan Note (Signed)
Ongoing urge > stress incontinence.  Continue to monitor.

## 2020-06-27 NOTE — Assessment & Plan Note (Signed)
Chronic, stable. K slightly elevated after recent ACEI start - continue to monitor.

## 2020-06-27 NOTE — Assessment & Plan Note (Signed)
Continue weekly replacement.  

## 2020-06-27 NOTE — Assessment & Plan Note (Signed)
Chronic, stable on cymbalta 60mg  daily.

## 2020-06-27 NOTE — Assessment & Plan Note (Signed)
Continue monthly injections as levels remain low normal.

## 2020-06-27 NOTE — Assessment & Plan Note (Signed)
Preventative protocols reviewed and updated unless pt declined. Discussed healthy diet and lifestyle.  

## 2020-06-27 NOTE — Assessment & Plan Note (Signed)
Continue aspirin, statin.  

## 2020-06-27 NOTE — Assessment & Plan Note (Signed)
Appreciate wound clinic care.  

## 2020-06-27 NOTE — Assessment & Plan Note (Signed)
Chronic, stable on daily protonix - continue.

## 2020-06-27 NOTE — Assessment & Plan Note (Addendum)
S/p HHPT involvement 03/2020  Has dropped gabapentin to 300mg  once daily due to worsening unsteadiness.

## 2020-06-27 NOTE — Assessment & Plan Note (Signed)
Appreciate [facility] care.  

## 2020-06-27 NOTE — Assessment & Plan Note (Signed)
Update iron panel. Low iron could contribute to worsening RLS

## 2020-06-27 NOTE — Assessment & Plan Note (Signed)
CBC stable. Continue aspirin, eliquis.

## 2020-06-27 NOTE — Telephone Encounter (Signed)
RE: [name] (ferumoxytol) infusion prior authorization.   No prior authorization required. Confirmed with [facility] Medicare provider services  Tresa Res, PharmD

## 2020-06-27 NOTE — Assessment & Plan Note (Signed)
Chronic, adequate. Continue current regimen.  

## 2020-06-28 ENCOUNTER — Other Ambulatory Visit: Payer: Self-pay | Admitting: Cardiovascular Disease

## 2020-06-29 ENCOUNTER — Other Ambulatory Visit: Payer: Self-pay

## 2020-06-29 ENCOUNTER — Ambulatory Visit (INDEPENDENT_AMBULATORY_CARE_PROVIDER_SITE_OTHER): Payer: Medicare Other

## 2020-06-29 VITALS — BP 139/66 | HR 57 | Temp 97.9°F | Resp 20

## 2020-06-29 DIAGNOSIS — M5136 Other intervertebral disc degeneration, lumbar region: Secondary | ICD-10-CM | POA: Diagnosis not present

## 2020-06-29 DIAGNOSIS — I5032 Chronic diastolic (congestive) heart failure: Secondary | ICD-10-CM | POA: Diagnosis not present

## 2020-06-29 DIAGNOSIS — Z9181 History of falling: Secondary | ICD-10-CM | POA: Diagnosis not present

## 2020-06-29 DIAGNOSIS — I872 Venous insufficiency (chronic) (peripheral): Secondary | ICD-10-CM | POA: Diagnosis not present

## 2020-06-29 DIAGNOSIS — I130 Hypertensive heart and chronic kidney disease with heart failure and stage 1 through stage 4 chronic kidney disease, or unspecified chronic kidney disease: Secondary | ICD-10-CM | POA: Diagnosis not present

## 2020-06-29 DIAGNOSIS — D509 Iron deficiency anemia, unspecified: Secondary | ICD-10-CM

## 2020-06-29 DIAGNOSIS — S62102D Fracture of unspecified carpal bone, left wrist, subsequent encounter for fracture with routine healing: Secondary | ICD-10-CM | POA: Diagnosis not present

## 2020-06-29 DIAGNOSIS — M1990 Unspecified osteoarthritis, unspecified site: Secondary | ICD-10-CM | POA: Diagnosis not present

## 2020-06-29 DIAGNOSIS — K219 Gastro-esophageal reflux disease without esophagitis: Secondary | ICD-10-CM | POA: Diagnosis not present

## 2020-06-29 DIAGNOSIS — E559 Vitamin D deficiency, unspecified: Secondary | ICD-10-CM | POA: Diagnosis not present

## 2020-06-29 DIAGNOSIS — E538 Deficiency of other specified B group vitamins: Secondary | ICD-10-CM | POA: Diagnosis not present

## 2020-06-29 DIAGNOSIS — N1831 Chronic kidney disease, stage 3a: Secondary | ICD-10-CM | POA: Diagnosis not present

## 2020-06-29 DIAGNOSIS — I4891 Unspecified atrial fibrillation: Secondary | ICD-10-CM | POA: Diagnosis not present

## 2020-06-29 DIAGNOSIS — G2581 Restless legs syndrome: Secondary | ICD-10-CM | POA: Diagnosis not present

## 2020-06-29 DIAGNOSIS — Z7982 Long term (current) use of aspirin: Secondary | ICD-10-CM | POA: Diagnosis not present

## 2020-06-29 DIAGNOSIS — I272 Pulmonary hypertension, unspecified: Secondary | ICD-10-CM | POA: Diagnosis not present

## 2020-06-29 DIAGNOSIS — M810 Age-related osteoporosis without current pathological fracture: Secondary | ICD-10-CM | POA: Diagnosis not present

## 2020-06-29 DIAGNOSIS — Z7901 Long term (current) use of anticoagulants: Secondary | ICD-10-CM | POA: Diagnosis not present

## 2020-06-29 DIAGNOSIS — G9009 Other idiopathic peripheral autonomic neuropathy: Secondary | ICD-10-CM | POA: Diagnosis not present

## 2020-06-29 DIAGNOSIS — L97513 Non-pressure chronic ulcer of other part of right foot with necrosis of muscle: Secondary | ICD-10-CM | POA: Diagnosis not present

## 2020-06-29 DIAGNOSIS — E7849 Other hyperlipidemia: Secondary | ICD-10-CM | POA: Diagnosis not present

## 2020-06-29 DIAGNOSIS — Z792 Long term (current) use of antibiotics: Secondary | ICD-10-CM | POA: Diagnosis not present

## 2020-06-29 DIAGNOSIS — I2510 Atherosclerotic heart disease of native coronary artery without angina pectoris: Secondary | ICD-10-CM | POA: Diagnosis not present

## 2020-06-29 MED ORDER — SODIUM CHLORIDE 0.9 % IV SOLN: Freq: Once | INTRAVENOUS | Status: DC | PRN

## 2020-06-29 MED ORDER — SODIUM CHLORIDE 0.9 % IV SOLN
510.0000 mg | Freq: Once | INTRAVENOUS | Status: AC
  Administered 2020-06-29: 510 mg via INTRAVENOUS
  Filled 2020-06-29: qty 17

## 2020-06-29 MED ORDER — DIPHENHYDRAMINE HCL 50 MG/ML IJ SOLN: 50.0000 mg | Freq: Once | INTRAMUSCULAR | Status: DC | PRN

## 2020-06-29 MED ORDER — EPINEPHRINE 0.3 MG/0.3ML IJ SOAJ: 0.3000 mg | Freq: Once | INTRAMUSCULAR | Status: DC | PRN

## 2020-06-29 MED ORDER — METHYLPREDNISOLONE SODIUM SUCC 125 MG IJ SOLR: 125.0000 mg | Freq: Once | INTRAMUSCULAR | Status: DC | PRN

## 2020-06-29 MED ORDER — ALBUTEROL SULFATE HFA 108 (90 BASE) MCG/ACT IN AERS: 2.0000 | INHALATION_SPRAY | Freq: Once | RESPIRATORY_TRACT | Status: DC | PRN

## 2020-06-29 MED ORDER — FAMOTIDINE IN NACL 20-0.9 MG/50ML-% IV SOLN: 20.0000 mg | Freq: Once | INTRAVENOUS | Status: DC | PRN

## 2020-06-29 NOTE — Progress Notes (Signed)
Diagnosis: Anemia  Provider: Marshell Garfinkel   Procedure: Infusion, Medication: Ferumoxytol, {Site: Intravenous) by Arnoldo Morale, RN  Discharge: Condition: Good, Destination: Home . AVS provided. by .Sabitri  Ranabhat, RN   30 minutes post infusion observation without any side effects.Vitals obtained and recorded. No sign and symptoms of distress.

## 2020-06-30 ENCOUNTER — Encounter: Payer: Medicare Other | Admitting: Physician Assistant

## 2020-06-30 DIAGNOSIS — N1831 Chronic kidney disease, stage 3a: Secondary | ICD-10-CM | POA: Diagnosis not present

## 2020-06-30 DIAGNOSIS — G9009 Other idiopathic peripheral autonomic neuropathy: Secondary | ICD-10-CM | POA: Diagnosis not present

## 2020-06-30 DIAGNOSIS — Z9181 History of falling: Secondary | ICD-10-CM | POA: Diagnosis not present

## 2020-06-30 DIAGNOSIS — I2510 Atherosclerotic heart disease of native coronary artery without angina pectoris: Secondary | ICD-10-CM | POA: Diagnosis not present

## 2020-06-30 DIAGNOSIS — Z7982 Long term (current) use of aspirin: Secondary | ICD-10-CM | POA: Diagnosis not present

## 2020-06-30 DIAGNOSIS — L97512 Non-pressure chronic ulcer of other part of right foot with fat layer exposed: Secondary | ICD-10-CM | POA: Diagnosis not present

## 2020-06-30 DIAGNOSIS — M1990 Unspecified osteoarthritis, unspecified site: Secondary | ICD-10-CM | POA: Diagnosis not present

## 2020-06-30 DIAGNOSIS — I739 Peripheral vascular disease, unspecified: Secondary | ICD-10-CM | POA: Diagnosis not present

## 2020-06-30 DIAGNOSIS — I872 Venous insufficiency (chronic) (peripheral): Secondary | ICD-10-CM | POA: Diagnosis not present

## 2020-06-30 DIAGNOSIS — E559 Vitamin D deficiency, unspecified: Secondary | ICD-10-CM | POA: Diagnosis not present

## 2020-06-30 DIAGNOSIS — M810 Age-related osteoporosis without current pathological fracture: Secondary | ICD-10-CM | POA: Diagnosis not present

## 2020-06-30 DIAGNOSIS — S62102D Fracture of unspecified carpal bone, left wrist, subsequent encounter for fracture with routine healing: Secondary | ICD-10-CM | POA: Diagnosis not present

## 2020-06-30 DIAGNOSIS — E538 Deficiency of other specified B group vitamins: Secondary | ICD-10-CM | POA: Diagnosis not present

## 2020-06-30 DIAGNOSIS — G2581 Restless legs syndrome: Secondary | ICD-10-CM | POA: Diagnosis not present

## 2020-06-30 DIAGNOSIS — I4891 Unspecified atrial fibrillation: Secondary | ICD-10-CM | POA: Diagnosis not present

## 2020-06-30 DIAGNOSIS — K219 Gastro-esophageal reflux disease without esophagitis: Secondary | ICD-10-CM | POA: Diagnosis not present

## 2020-06-30 DIAGNOSIS — I5032 Chronic diastolic (congestive) heart failure: Secondary | ICD-10-CM | POA: Diagnosis not present

## 2020-06-30 DIAGNOSIS — L97513 Non-pressure chronic ulcer of other part of right foot with necrosis of muscle: Secondary | ICD-10-CM | POA: Diagnosis not present

## 2020-06-30 DIAGNOSIS — M5136 Other intervertebral disc degeneration, lumbar region: Secondary | ICD-10-CM | POA: Diagnosis not present

## 2020-06-30 DIAGNOSIS — Z7901 Long term (current) use of anticoagulants: Secondary | ICD-10-CM | POA: Diagnosis not present

## 2020-06-30 DIAGNOSIS — I272 Pulmonary hypertension, unspecified: Secondary | ICD-10-CM | POA: Diagnosis not present

## 2020-06-30 DIAGNOSIS — I130 Hypertensive heart and chronic kidney disease with heart failure and stage 1 through stage 4 chronic kidney disease, or unspecified chronic kidney disease: Secondary | ICD-10-CM | POA: Diagnosis not present

## 2020-06-30 DIAGNOSIS — I70235 Atherosclerosis of native arteries of right leg with ulceration of other part of foot: Secondary | ICD-10-CM | POA: Diagnosis not present

## 2020-06-30 DIAGNOSIS — Z792 Long term (current) use of antibiotics: Secondary | ICD-10-CM | POA: Diagnosis not present

## 2020-06-30 DIAGNOSIS — E7849 Other hyperlipidemia: Secondary | ICD-10-CM | POA: Diagnosis not present

## 2020-06-30 DIAGNOSIS — I480 Paroxysmal atrial fibrillation: Secondary | ICD-10-CM | POA: Diagnosis not present

## 2020-06-30 NOTE — Telephone Encounter (Signed)
Patient had lab work completed on 3/10, [place] scheduled on 3/29 for Prolia Injection.

## 2020-06-30 NOTE — Progress Notes (Addendum)
AMMI, HUTT ([id number]) Visit Report for 06/30/2020 Chief Complaint Document Details Patient Name: [name], [name]. Date of Service: 06/30/2020 11:15 AM Medical Record Number: [id number] Patient Account Number: [id number] Date of Birth/Sex: 1929-07-15 (87 y.o. F) Treating RN: Carlene Coria Primary Care Provider: Ria Bush Other Clinician: Jeanine Luz Referring Provider: Ria Bush Treating Provider/Extender: Skipper Cliche in Treatment: 37 Information Obtained from: Patient Chief Complaint Right Lateral Foot Ulcer Electronic Signature(s) Signed: 06/30/2020 11:37:53 AM By: Worthy Keeler PA-C Entered By: Worthy Keeler on 06/30/2020 11:37:53 [name], [name] ([id number]) -------------------------------------------------------------------------------- Debridement Details Patient Name: [name] Date of Service: 06/30/2020 11:15 AM Medical Record Number: [id number] Patient Account Number: [id number] Date of Birth/Sex: 1929/10/17 (87 y.o. F) Treating RN: Carlene Coria Primary Care Provider: Ria Bush Other Clinician: Jeanine Luz Referring Provider: Ria Bush Treating Provider/Extender: Skipper Cliche in Treatment: 37 Debridement Performed for Wound #3 Right Metatarsal head fifth Assessment: Performed By: Physician Tommie Sams., PA-C Debridement Type: Debridement Severity of Tissue Pre Debridement: Fat layer exposed Level of Consciousness (Pre- Awake and Alert procedure): Pre-procedure Verification/Time Out Yes - 11:38 Taken: Start Time: 11:38 Pain Control: Lidocaine 4% Topical Solution Total Area Debrided (L x W): 0.1 (cm) x 0.1 (cm) = 0.01 (cm) Tissue and other material Viable, Non-Viable, Slough, Subcutaneous, Skin: Dermis , Skin: Epidermis, Slough debrided: Level: Skin/Subcutaneous Tissue Debridement Description: Excisional Instrument: Curette Bleeding: Minimum Hemostasis Achieved: Pressure End Time:  11:43 Procedural Pain: 0 Post Procedural Pain: 0 Response to Treatment: Procedure was tolerated well Level of Consciousness (Post- Awake and Alert procedure): Post Debridement Measurements of Total Wound Length: (cm) 0.3 Width: (cm) 0.3 Depth: (cm) 0.3 Volume: (cm) 0.021 Character of Wound/Ulcer Post Debridement: Improved Severity of Tissue Post Debridement: Fat layer exposed Post Procedure Diagnosis Same as Pre-procedure Electronic Signature(s) Signed: 06/30/2020 2:05:44 PM By: Worthy Keeler PA-C Signed: 07/03/2020 5:08:01 PM By: Carlene Coria RN Entered By: Carlene Coria on 06/30/2020 11:43:40 [name] ([id number]) -------------------------------------------------------------------------------- HPI Details Patient Name: [name]. Date of Service: 06/30/2020 11:15 AM Medical Record Number: [id number] Patient Account Number: [id number] Date of Birth/Sex: 01-Nov-1929 (87 y.o. F) Treating RN: Carlene Coria Primary Care Provider: Ria Bush Other Clinician: Jeanine Luz Referring Provider: Ria Bush Treating Provider/Extender: Skipper Cliche in Treatment: 37 History of Present Illness HPI Description: 10/12/2019 upon evaluation today patient appears to be doing somewhat poorly in regard to her wound at this point. She has a ulcer over the right fifth metatarsal region. This has been managed by podiatry up to this point. Unfortunately the patient just does not seem to be making the progress that they were hoping for an subsequently the patient was referred to [place] by [facility] Dr. Posey Pronto. With that being said I did note that the patient does have peripheral vascular disease which has been managed by Dr. Lazaro Arms her most recent angiogram with intervention was on 08/02/2019. The ABIs following were on the right 1.30 with a TBI of 0.61 normal left 1.31 with a TBI of 0.58. Obviously things seem to be doing somewhat better at this point hopefully  enough to allow her to heal. She is on anticoagulant therapy due to atrial fibrillation long-term. With that being said the patient is unfortunately having quite a bit of discomfort at this time secondary to the wound. She also has erythema which is very likely secondary to infection. I am going obtain a culture of the area today once debridement is complete. Apparently the patient  also had a x-ray that was performed by Dr. Posey Pronto on 09/21/2019 and this showed that the patient had no obvious signs on x-ray of cortical irregularity and no osteomyelitis obviously noted. With that being said as I discussed with the patient's daughter as well this does not indicate that the patient absolutely has no osteomyelitis as x-rays are only at best 50% helpful in identifying this complication. Nonetheless I do believe that we need to have the patient continue to have this further evaluated specifically I am to recommend that we check into an MRI which I think would be ideal to evaluate whether or not there is osteomyelitis present. 7/16; this is a patient with an ulcer over the right fifth metatarsal head. She follows with Dr. Lucky Cowboy [facility]. She is due to have an MRI of the foot on 7/26. Using Santyl to the wound 11/04/2019 on evaluation today patient appears to be doing okay in regard to her wound at this time. Unfortunately since I last saw her which was actually her second visit here in the clinic she has moved into a new apartment and then subsequently had a fall. She ended up in the hospital she did fracture her left arm and subsequently has also been dealing with a lot of dizziness she has been at [employer]. She tells me that getting in the change the dressing has been somewhat difficult as far as daily dressing changes are concerned with the Santyl. Nonetheless when they do change it that is what is been used and her daughter-in-law has also been coming in to help out as well with the  dressing changes to try to make sure it gets done daily. Fortunately there is no signs of active infection at this time. No fever chills noted. I do believe the Santyl has been beneficial and is likely still the best way to go at this point for the patient. I did review the patient's MRI which showed some potential for reactive osteitis but did not show any signs of osteomyelitis overtly. That cannot be excluded but again right now it does not appear that is very likely present as well. This is good news. I also did review her culture she has been on the Bactrim she tells me she has taken this and again that was appropriate based on the results of her culture. 11/19/2019 upon evaluation today patient appears to be doing decently well in regard to her wound. Fortunately there is no signs of active infection at this time wound is not significantly larger in fact is roughly about the same at this point. With that being said there is not as much erythema as previously noted I think the infection seems to be under much better control which is great news. Unfortunately she has fallen since I last saw her and broken her left arm she has an appointment with them following the appointment with me. 8/25; patient was worked in acutely today at the request of home health and was concerned about her right heel, generalized erythema in her feet bilaterally. According to her daughter things have been getting worse over the last for 5 days but not precisely from a wound care point of view. Her wound is on the lateral part of the right fifth metatarsal head looks about the same as I remember this. She has developed dryness and flaking of the skin on her plantar feet. As well a very tender area localized over the insertion point of the Achilles tendon.  She has restless leg syndrome. Does not specifically offload the area at night. She wears socks at home and does not really give me a pressure on the right heel issue. I  reviewed her vascular status. She underwent her last revascularization on 08/02/2019 by Dr. Lucky Cowboy. This included an angioplasty of the right peroneal artery as well as the right posterior tibial artery. Her last noninvasive arterial study was on 11/23/2019. On the right her ABI was 1.08 TBI of 0.48. On the left ABI at 1.21 with a TBI of 0.54. Family relates that Dr. Lucky Cowboy felt that he had done the best he could and this. We have been using Santyl to the one wound she has on the right lateral fifth metatarsal head. She does not have a new wound today which is the concern that letter coming into the clinic 12/09/2019 on evaluation today patient appears to be doing decently well in regard to her foot ulcer at this point. She does note that with the compression wrap that the home health nurse put on after contacting our clinic that she actually did much better and felt much better. Her heel is also improved. Fortunately there is no signs of active infection at this time. No fevers, chills, nausea, vomiting, or diarrhea. 12/24/2019 upon evaluation today patient appears to be doing somewhat poorly still in regard to her foot. Unfortunately this is just not showing the signs of improvement that I would like to see it is a little bit cleaner but also appears to be erythematous and I believe there is infection noted at this point. We will get a need to address this in my opinion. I did actually recommend that we switch back to the Iodoflex as well apparently the home health nurse had switch this away that not really what we want to do at this point in my opinion. They were using silver alginate I think most recently. 01/06/2020 upon evaluation today patient actually appears to be doing much better in regard to her wound. We have switched back to the silver alginate after her home health nurse contacted me and honestly I feel like she is doing quite well in that regard. She does have a lot of drainage and therefore I  think that is probably a better option for. With that being said she is tolerating the compression wrap without complication that also seems to be helping. 01/28/2020 on evaluation today patient appears to be doing well with regard to her foot ulcer. I do feel like she is showing signs of improvement which is great news. There is no signs of active infection at this time which is also good news. No fevers, chills, nausea, vomiting, or diarrhea. Unfortunately she did have a fall and sustained an injury to the left upper arm. She has a skin tear here this appears to be doing okay although [name], [name]. ([id number]) the dressing was stuck to this this was an alginate dressing. I really think she probably needs something like Xeroform to try to prevent anything from sticking to this region. Other than that she seems to be doing quite well. 02/11/2020 on evaluation today patient's arm skin tear appears to be completely healed which is great. Unfortunately she is still having issues at this time with her foot although this seems to be showing signs of improvement it still very slow. There is no signs of active infection at this time. 03/06/2020 upon evaluation today patient appears to be doing decently well in regard to her  foot ulcer. There is some minimal slough noted on the surface of the wound building up at this point. Otherwise I really feel like she is doing quite nicely with quite a bit of new granulation and epithelial growth. 03/23/2020 upon evaluation today patient appears to be doing well with regards to her wound. She has been tolerating the dressing changes without complication. Fortunately there is no evidence of active infection and overall feel like her foot ulcer is doing excellent at this point. 04/13/2020 on evaluation today patient appears to be doing well with regard to her foot ulcer. She has been tolerating the dressing changes without complication. Fortunately there is no signs of  active infection at this time. Overall I feel like the wound is getting close to complete closure and this appears to be doing quite well in my opinion. 04/27/2020 on evaluation today patient appears to be doing excellent in regard to her wound. She has been tolerating the dressing changes without complication. Fortunately there is no signs of active infection and overall very pleased with where things stand today. In fact I feel like the wound is very close to complete resolution. 05/18/2020 upon evaluation today patient appears to be doing well with regard to her foot ulcer. There is no evidence of active infection at this time which is great news and overall very pleased with where things stand. No fevers, chills, nausea, vomiting, or diarrhea. I do feel like she is making great progress and I feel like that the wound is very tiny but again this last portion getting close has been somewhat difficult. 07/05/2020 upon evaluation today patient appears to be doing a little poorly in regard to her foot ulcer. Fortunately there is no signs of active infection at this time systemically though locally I feel it may be some cellulitis it looks like this wound may have prematurely sealed up subsequently causing a blister that had to be removed. The good news is that I do not think it is a very bad infection but I believe she may be experiencing cellulitis. 06/16/2020 upon evaluation today patient appears to be doing well with regard to her wound. It seems like it showed signs of improvement compared to last week where she had more infection noted. With that being said I do believe that based on what I am seeing the patient seems to be making excellent progress although I think still this is a small area that is going require some debridement to clear away some of the callus and drainage which is trapping fluid behind it based on what I see. 06/23/2020 upon evaluation today patient appears to be doing well with  regard to her leg ulcer. She has been tolerating the dressing changes without application. And currently I do believe the alginate is doing a good job keeping it dry it still tends to cover over and we do not want her trapping any fluid therefore I do remove that outer callus/drainage covering each time she comes in. Overall I think this is doing a great job. 06/30/2020 patient appears to be doing okay in regard to pain in regard to her wound. Unfortunately she continues to develop a callus buildup over the area. This is unfortunate as to be honest it is continuing to cause problems with her basically being able to heal as she needs to really granulate in from the bottom out on that is not happening. I think we need to find something that works better to keep this area open.  Electronic Signature(s) Signed: 06/30/2020 12:33:35 PM By: Worthy Keeler PA-C Entered By: Worthy Keeler on 06/30/2020 12:33:35 [name], [name] ([id number]) -------------------------------------------------------------------------------- Physical Exam Details Patient Name: [name], [name]. Date of Service: 06/30/2020 11:15 AM Medical Record Number: [id number] Patient Account Number: [id number] Date of Birth/Sex: 1930/02/07 (87 y.o. F) Treating RN: Carlene Coria Primary Care Provider: Ria Bush Other Clinician: Jeanine Luz Referring Provider: Ria Bush Treating Provider/Extender: Skipper Cliche in Treatment: 84 Constitutional Well-nourished and well-hydrated in no acute distress. Respiratory normal breathing without difficulty. Psychiatric this patient is able to make decisions and demonstrates good insight into disease process. Alert and Oriented x 3. pleasant and cooperative. Notes Upon inspection patient's wound bed actually showed signs of good granulation and epithelization at this point. There does not appear to be any evidence of active infection which is great news and overall very  pleased with where things stand today. Electronic Signature(s) Signed: 06/30/2020 12:33:52 PM By: Worthy Keeler PA-C Entered By: Worthy Keeler on 06/30/2020 12:33:52 [name], [name] ([id number]) -------------------------------------------------------------------------------- Physician Orders Details Patient Name: [name] Date of Service: 06/30/2020 11:15 AM Medical Record Number: [id number] Patient Account Number: [id number] Date of Birth/Sex: 01-Dec-1929 (87 y.o. F) Treating RN: Carlene Coria Primary Care Provider: Ria Bush Other Clinician: Jeanine Luz Referring Provider: Ria Bush Treating Provider/Extender: Skipper Cliche in Treatment: [phone] Verbal / Phone Orders: No Diagnosis Coding ICD-10 Coding Code Description I73.89 Other specified peripheral vascular diseases L97.512 Non-pressure chronic ulcer of other part of right foot with fat layer exposed S41.102A Unspecified open wound of left upper arm, initial encounter Z79.01 Long term (current) use of anticoagulants I48.0 Paroxysmal atrial fibrillation B35.3 Tinea pedis Follow-up Appointments o Return Appointment in 1 week. [place]: - [place] for wound care. May utilize formulary equivalent dressing for wound treatment orders unless otherwise specified. Home Health Nurse may visit PRN to address patientos wound care needs. o Scheduled days for dressing changes to be completed; exception, patient has scheduled wound care visit that day. o **Please direct any NON-WOUND related issues/requests for orders to patient's Primary Care Physician. **If current dressing causes regression in wound condition, may D/C ordered dressing product/s and apply Normal Saline Moist Dressing daily until next [facility] or Other MD appointment. **Notify [facility] of regression in wound condition at [phone]. Wound Treatment Wound #3 -  Metatarsal head fifth Wound Laterality: Right Cleanser: Normal Saline (Generic) 2 x Per Week/30 Days Discharge Instructions: Wash your hands with soap and water. Remove old dressing, discard into plastic bag and place into trash. Cleanse the wound with Normal Saline prior to applying a clean dressing using gauze sponges, not tissues or cotton balls. Do not scrub or use excessive force. Pat dry using gauze sponges, not tissue or cotton balls. Primary Dressing: hydrofera blue rope (Generic) 2 x Per Week/30 Days Discharge Instructions: cut 1/2 inch section of rope , then cut this section into 2 pieces. then cut a slight angle to one end, place in wound dry until it touches wound bed , apply few drop of normal saline to activate Secondary Dressing: Mepilex Border Flex, 4x4 (in/in) (Generic) 2 x Per Week/30 Days Discharge Instructions: Apply to wound as directed. Do not cut. Electronic Signature(s) Signed: 06/30/2020 2:05:44 PM By: Worthy Keeler PA-C Signed: 07/03/2020 5:08:01 PM By: Carlene Coria RN Entered By: Carlene Coria on 06/30/2020 12:03:29 [name] ([id number]) -------------------------------------------------------------------------------- Problem List Details Patient Name: Crystal Form  S. Date of Service: 06/30/2020 11:15 AM Medical Record Number: [id number] Patient Account Number: [id number] Date of Birth/Sex: 08-26-1929 (87 y.o. F) Treating RN: Carlene Coria Primary Care Provider: Ria Bush Other Clinician: Jeanine Luz Referring Provider: Ria Bush Treating Provider/Extender: Skipper Cliche in Treatment: 37 Active Problems ICD-10 Encounter Code Description Active Date MDM Diagnosis I73.89 Other specified peripheral vascular diseases 10/12/2019 No Yes L97.512 Non-pressure chronic ulcer of other part of right foot with fat layer 10/12/2019 No Yes exposed S41.102A Unspecified open wound of left upper arm, initial encounter 01/28/2020 No Yes Z79.01 Long  term (current) use of anticoagulants 10/12/2019 No Yes I48.0 Paroxysmal atrial fibrillation 10/12/2019 No Yes B35.3 Tinea pedis 12/01/2019 No Yes Inactive Problems Resolved Problems Electronic Signature(s) Signed: 06/30/2020 11:37:46 AM By: Worthy Keeler PA-C Entered By: Worthy Keeler on 06/30/2020 11:37:46 [name] ([id number]) -------------------------------------------------------------------------------- Progress Note Details Patient Name: [name]. Date of Service: 06/30/2020 11:15 AM Medical Record Number: [id number] Patient Account Number: [id number] Date of Birth/Sex: [birth year] (87 y.o. F) Treating RN: Carlene Coria Primary Care Provider: Ria Bush Other Clinician: Jeanine Luz Referring Provider: Ria Bush Treating Provider/Extender: Skipper Cliche in Treatment: 37 Subjective Chief Complaint Information obtained from Patient Right Lateral Foot Ulcer History of Present Illness (HPI) 10/12/2019 upon evaluation today patient appears to be doing somewhat poorly in regard to her wound at this point. She has a ulcer over the right fifth metatarsal region. This has been managed by podiatry up to this point. Unfortunately the patient just does not seem to be making the progress that they were hoping for an subsequently the patient was referred to [place] by [facility] Dr. Posey Pronto. With that being said I did note that the patient does have peripheral vascular disease which has been managed by Dr. Lazaro Arms her most recent angiogram with intervention was on 08/02/2019. The ABIs following were on the right 1.30 with a TBI of 0.61 normal left 1.31 with a TBI of 0.58. Obviously things seem to be doing somewhat better at this point hopefully enough to allow her to heal. She is on anticoagulant therapy due to atrial fibrillation long-term. With that being said the patient is unfortunately having quite a bit of discomfort at this time secondary to the wound.  She also has erythema which is very likely secondary to infection. I am going obtain a culture of the area today once debridement is complete. Apparently the patient also had a x-ray that was performed by Dr. Posey Pronto on 09/21/2019 and this showed that the patient had no obvious signs on x-ray of cortical irregularity and no osteomyelitis obviously noted. With that being said as I discussed with the patient's daughter as well this does not indicate that the patient absolutely has no osteomyelitis as x-rays are only at best 50% helpful in identifying this complication. Nonetheless I do believe that we need to have the patient continue to have this further evaluated specifically I am to recommend that we check into an MRI which I think would be ideal to evaluate whether or not there is osteomyelitis present. 7/16; this is a patient with an ulcer over the right fifth metatarsal head. She follows with Dr. Lucky Cowboy at [facility]. She is due to have an MRI of the foot on 7/26. Using Santyl to the wound 11/04/2019 on evaluation today patient appears to be doing okay in regard to her wound at this time. Unfortunately since I last saw her which was actually her second  visit here in the clinic she has moved into a new apartment and then subsequently had a fall. She ended up in the hospital she did fracture her left arm and subsequently has also been dealing with a lot of dizziness she has been at [employer]. She tells me that getting in the change the dressing has been somewhat difficult as far as daily dressing changes are concerned with the Santyl. Nonetheless when they do change it that is what is been used and her daughter-in-law has also been coming in to help out as well with the dressing changes to try to make sure it gets done daily. Fortunately there is no signs of active infection at this time. No fever chills noted. I do believe the Santyl has been beneficial and is likely still the best way to  go at this point for the patient. I did review the patient's MRI which showed some potential for reactive osteitis but did not show any signs of osteomyelitis overtly. That cannot be excluded but again right now it does not appear that is very likely present as well. This is good news. I also did review her culture she has been on the Bactrim she tells me she has taken this and again that was appropriate based on the results of her culture. 11/19/2019 upon evaluation today patient appears to be doing decently well in regard to her wound. Fortunately there is no signs of active infection at this time wound is not significantly larger in fact is roughly about the same at this point. With that being said there is not as much erythema as previously noted I think the infection seems to be under much better control which is great news. Unfortunately she has fallen since I last saw her and broken her left arm she has an appointment with them following the appointment with me. 8/25; patient was worked in acutely today at the request of home health and was concerned about her right heel, generalized erythema in her feet bilaterally. According to her daughter things have been getting worse over the last for 5 days but not precisely from a wound care point of view. Her wound is on the lateral part of the right fifth metatarsal head looks about the same as I remember this. She has developed dryness and flaking of the skin on her plantar feet. As well a very tender area localized over the insertion point of the Achilles tendon. She has restless leg syndrome. Does not specifically offload the area at night. She wears socks at home and does not really give me a pressure on the right heel issue. I reviewed her vascular status. She underwent her last revascularization on 08/02/2019 by Dr. Lucky Cowboy. This included an angioplasty of the right peroneal artery as well as the right posterior tibial artery. Her last noninvasive  arterial study was on 11/23/2019. On the right her ABI was 1.08 TBI of 0.48. On the left ABI at 1.21 with a TBI of 0.54. Family relates that Dr. Lucky Cowboy felt that he had done the best he could and this. We have been using Santyl to the one wound she has on the right lateral fifth metatarsal head. She does not have a new wound today which is the concern that letter coming into the clinic 12/09/2019 on evaluation today patient appears to be doing decently well in regard to her foot ulcer at this point. She does note that with the compression wrap that the home health nurse put on  after contacting our clinic that she actually did much better and felt much better. Her heel is also improved. Fortunately there is no signs of active infection at this time. No fevers, chills, nausea, vomiting, or diarrhea. 12/24/2019 upon evaluation today patient appears to be doing somewhat poorly still in regard to her foot. Unfortunately this is just not showing the signs of improvement that I would like to see it is a little bit cleaner but also appears to be erythematous and I believe there is infection noted at this point. We will get a need to address this in my opinion. I did actually recommend that we switch back to the Iodoflex as well apparently the home health nurse had switch this away that not really what we want to do at this point in my opinion. They were using silver alginate I think most recently. 01/06/2020 upon evaluation today patient actually appears to be doing much better in regard to her wound. We have switched back to the silver alginate after her home health nurse contacted me and honestly I feel like she is doing quite well in that regard. She does have a lot of drainage and therefore I think that is probably a better option for. With that being said she is tolerating the compression wrap without complication that [name], [name]. ([id number]) also seems to be helping. 01/28/2020 on evaluation today  patient appears to be doing well with regard to her foot ulcer. I do feel like she is showing signs of improvement which is great news. There is no signs of active infection at this time which is also good news. No fevers, chills, nausea, vomiting, or diarrhea. Unfortunately she did have a fall and sustained an injury to the left upper arm. She has a skin tear here this appears to be doing okay although the dressing was stuck to this this was an alginate dressing. I really think she probably needs something like Xeroform to try to prevent anything from sticking to this region. Other than that she seems to be doing quite well. 02/11/2020 on evaluation today patient's arm skin tear appears to be completely healed which is great. Unfortunately she is still having issues at this time with her foot although this seems to be showing signs of improvement it still very slow. There is no signs of active infection at this time. 03/06/2020 upon evaluation today patient appears to be doing decently well in regard to her foot ulcer. There is some minimal slough noted on the surface of the wound building up at this point. Otherwise I really feel like she is doing quite nicely with quite a bit of new granulation and epithelial growth. 03/23/2020 upon evaluation today patient appears to be doing well with regards to her wound. She has been tolerating the dressing changes without complication. Fortunately there is no evidence of active infection and overall feel like her foot ulcer is doing excellent at this point. 04/13/2020 on evaluation today patient appears to be doing well with regard to her foot ulcer. She has been tolerating the dressing changes without complication. Fortunately there is no signs of active infection at this time. Overall I feel like the wound is getting close to complete closure and this appears to be doing quite well in my opinion. 04/27/2020 on evaluation today patient appears to be doing  excellent in regard to her wound. She has been tolerating the dressing changes without complication. Fortunately there is no signs of active infection and overall very pleased  with where things stand today. In fact I feel like the wound is very close to complete resolution. 05/18/2020 upon evaluation today patient appears to be doing well with regard to her foot ulcer. There is no evidence of active infection at this time which is great news and overall very pleased with where things stand. No fevers, chills, nausea, vomiting, or diarrhea. I do feel like she is making great progress and I feel like that the wound is very tiny but again this last portion getting close has been somewhat difficult. 07/05/2020 upon evaluation today patient appears to be doing a little poorly in regard to her foot ulcer. Fortunately there is no signs of active infection at this time systemically though locally I feel it may be some cellulitis it looks like this wound may have prematurely sealed up subsequently causing a blister that had to be removed. The good news is that I do not think it is a very bad infection but I believe she may be experiencing cellulitis. 06/16/2020 upon evaluation today patient appears to be doing well with regard to her wound. It seems like it showed signs of improvement compared to last week where she had more infection noted. With that being said I do believe that based on what I am seeing the patient seems to be making excellent progress although I think still this is a small area that is going require some debridement to clear away some of the callus and drainage which is trapping fluid behind it based on what I see. 06/23/2020 upon evaluation today patient appears to be doing well with regard to her leg ulcer. She has been tolerating the dressing changes without application. And currently I do believe the alginate is doing a good job keeping it dry it still tends to cover over and we do not want  her trapping any fluid therefore I do remove that outer callus/drainage covering each time she comes in. Overall I think this is doing a great job. 06/30/2020 patient appears to be doing okay in regard to pain in regard to her wound. Unfortunately she continues to develop a callus buildup over the area. This is unfortunate as to be honest it is continuing to cause problems with her basically being able to heal as she needs to really granulate in from the bottom out on that is not happening. I think we need to find something that works better to keep this area open. Objective Constitutional Well-nourished and well-hydrated in no acute distress. Vitals Time Taken: 11:20 AM, Height: 62 in, Weight: 140 lbs, BMI: 25.6, Temperature: 98.2 F, Pulse: 76 bpm, Respiratory Rate: 18 breaths/min, Blood Pressure: 147/74 mmHg. Respiratory normal breathing without difficulty. Psychiatric this patient is able to make decisions and demonstrates good insight into disease process. Alert and Oriented x 3. pleasant and cooperative. General Notes: Upon inspection patient's wound bed actually showed signs of good granulation and epithelization at this point. There does not appear to be any evidence of active infection which is great news and overall very pleased with where things stand today. Integumentary (Hair, Skin) Wound #3 status is Open. Original cause of wound was Gradually Appeared. The date acquired was: 04/09/2019. The wound has been in treatment 37 weeks. The wound is located on the Right Metatarsal head fifth. The wound measures 0.1cm length x 0.1cm width x 0.1cm depth; 0.008cm^2 [name], Crystal S. ([id number]) area and 0.001cm^3 volume. There is Fat Layer (Subcutaneous Tissue) exposed. There is no tunneling or undermining noted. There is  a none present amount of drainage noted. The wound margin is distinct with the outline attached to the wound base. There is small (1-33%) pink granulation within the wound  bed. There is a small (1-33%) amount of necrotic tissue within the wound bed including Adherent Slough. Assessment Active Problems ICD-10 Other specified peripheral vascular diseases Non-pressure chronic ulcer of other part of right foot with fat layer exposed Unspecified open wound of left upper arm, initial encounter Long term (current) use of anticoagulants Paroxysmal atrial fibrillation Tinea pedis Procedures Wound #3 Pre-procedure diagnosis of Wound #3 is an Arterial Insufficiency Ulcer located on the Right Metatarsal head fifth .Severity of Tissue Pre Debridement is: Fat layer exposed. There was a Excisional Skin/Subcutaneous Tissue Debridement with a total area of 0.01 sq cm performed by Tommie Sams., PA-C. With the following instrument(s): Curette to remove Viable and Non-Viable tissue/material. Material removed includes Subcutaneous Tissue, Slough, Skin: Dermis, and Skin: Epidermis after achieving pain control using Lidocaine 4% Topical Solution. No specimens were taken. A time out was conducted at 11:38, prior to the start of the procedure. A Minimum amount of bleeding was controlled with Pressure. The procedure was tolerated well with a pain level of 0 throughout and a pain level of 0 following the procedure. Post Debridement Measurements: 0.3cm length x 0.3cm width x 0.3cm depth; 0.021cm^3 volume. Character of Wound/Ulcer Post Debridement is improved. Severity of Tissue Post Debridement is: Fat layer exposed. Post procedure Diagnosis Wound #3: Same as Pre-Procedure Plan Follow-up Appointments: Return Appointment in 1 week. Home Health: [place]: - [facility] for wound care. May utilize formulary equivalent dressing for wound treatment orders unless otherwise specified. Home Health Nurse may visit PRN to address patient s wound care needs. Scheduled days for dressing changes to be completed; exception, patient has scheduled wound care visit that  day. **Please direct any NON-WOUND related issues/requests for orders to patient's Primary Care Physician. **If current dressing causes regression in wound condition, may D/C ordered dressing product/s and apply Normal Saline Moist Dressing daily until next [place] or Other MD appointment. **Notify [facility] of regression in wound condition at [phone]. WOUND #3: - Metatarsal head fifth Wound Laterality: Right Cleanser: Normal Saline (Generic) 2 x Per Week/30 Days Discharge Instructions: Wash your hands with soap and water. Remove old dressing, discard into plastic bag and place into trash. Cleanse the wound with Normal Saline prior to applying a clean dressing using gauze sponges, not tissues or cotton balls. Do not scrub or use excessive force. Pat dry using gauze sponges, not tissue or cotton balls. Primary Dressing: hydrofera blue rope (Generic) 2 x Per Week/30 Days Discharge Instructions: cut 1/2 inch section of rope , then cut this section into 2 pieces. then cut a slight angle to one end, place in wound dry until it touches wound bed , apply few drop of normal saline to activate Secondary Dressing: Mepilex Border Flex, 4x4 (in/in) (Generic) 2 x Per Week/30 Days Discharge Instructions: Apply to wound as directed. Do not cut. 1. Would recommend currently that we go ahead and initiate treatment with the Hydrofera Blue rope which I think will do well to put and dry and then moisten to try to help keep this area open. 2. I am also can recommend the patient continue to cover with a the border foam dressing. 3. I am also can recommend that we continue to monitor for infection obviously would try to keep things from worsening as far  as any type of infection is concerned. [name], [name] ([id number]) We will see patient back for reevaluation in 1 week here in the clinic. If anything worsens or changes patient will contact our office for  additional recommendations. Electronic Signature(s) Signed: 06/30/2020 12:34:33 PM By: Worthy Keeler PA-C Entered By: Worthy Keeler on 06/30/2020 12:34:33 [name] ([id number]) -------------------------------------------------------------------------------- SuperBill Details Patient Name: [name] Date of Service: 06/30/2020 Medical Record Number: [id number] Patient Account Number: [id number] Date of Birth/Sex: 09-14-1929 (87 y.o. F) Treating RN: Carlene Coria Primary Care Provider: Ria Bush Other Clinician: Jeanine Luz Referring Provider: Ria Bush Treating Provider/Extender: Skipper Cliche in Treatment: 37 Diagnosis Coding ICD-10 Codes Code Description I73.89 Other specified peripheral vascular diseases L97.512 Non-pressure chronic ulcer of other part of right foot with fat layer exposed S41.102A Unspecified open wound of left upper arm, initial encounter Z79.01 Long term (current) use of anticoagulants I48.0 Paroxysmal atrial fibrillation B35.3 Tinea pedis Facility Procedures CPT4 Code: [id number] Description: 89169 - DEB SUBQ TISSUE 20 SQ CM/< Modifier: Quantity: 1 CPT4 Code: Description: ICD-10 Diagnosis Description L97.512 Non-pressure chronic ulcer of other part of right foot with fat layer ex Modifier: posed Quantity: Physician Procedures CPT4 Code: [id number] Description: 11042 - WC PHYS SUBQ TISS 20 SQ CM Modifier: Quantity: 1 CPT4 Code: Description: ICD-10 Diagnosis Description L97.512 Non-pressure chronic ulcer of other part of right foot with fat layer ex Modifier: posed Quantity: Electronic Signature(s) Signed: 06/30/2020 12:35:03 PM By: Worthy Keeler PA-C Entered By: Worthy Keeler on 06/30/2020 12:35:02

## 2020-07-03 DIAGNOSIS — I272 Pulmonary hypertension, unspecified: Secondary | ICD-10-CM | POA: Diagnosis not present

## 2020-07-03 DIAGNOSIS — I130 Hypertensive heart and chronic kidney disease with heart failure and stage 1 through stage 4 chronic kidney disease, or unspecified chronic kidney disease: Secondary | ICD-10-CM | POA: Diagnosis not present

## 2020-07-03 DIAGNOSIS — I2510 Atherosclerotic heart disease of native coronary artery without angina pectoris: Secondary | ICD-10-CM | POA: Diagnosis not present

## 2020-07-03 DIAGNOSIS — N1831 Chronic kidney disease, stage 3a: Secondary | ICD-10-CM | POA: Diagnosis not present

## 2020-07-03 DIAGNOSIS — Z792 Long term (current) use of antibiotics: Secondary | ICD-10-CM | POA: Diagnosis not present

## 2020-07-03 DIAGNOSIS — L97513 Non-pressure chronic ulcer of other part of right foot with necrosis of muscle: Secondary | ICD-10-CM | POA: Diagnosis not present

## 2020-07-03 DIAGNOSIS — S62102D Fracture of unspecified carpal bone, left wrist, subsequent encounter for fracture with routine healing: Secondary | ICD-10-CM | POA: Diagnosis not present

## 2020-07-03 DIAGNOSIS — M810 Age-related osteoporosis without current pathological fracture: Secondary | ICD-10-CM | POA: Diagnosis not present

## 2020-07-03 DIAGNOSIS — G9009 Other idiopathic peripheral autonomic neuropathy: Secondary | ICD-10-CM | POA: Diagnosis not present

## 2020-07-03 DIAGNOSIS — I872 Venous insufficiency (chronic) (peripheral): Secondary | ICD-10-CM | POA: Diagnosis not present

## 2020-07-03 DIAGNOSIS — K219 Gastro-esophageal reflux disease without esophagitis: Secondary | ICD-10-CM | POA: Diagnosis not present

## 2020-07-03 DIAGNOSIS — M1990 Unspecified osteoarthritis, unspecified site: Secondary | ICD-10-CM | POA: Diagnosis not present

## 2020-07-03 DIAGNOSIS — E559 Vitamin D deficiency, unspecified: Secondary | ICD-10-CM | POA: Diagnosis not present

## 2020-07-03 DIAGNOSIS — Z7901 Long term (current) use of anticoagulants: Secondary | ICD-10-CM | POA: Diagnosis not present

## 2020-07-03 DIAGNOSIS — E7849 Other hyperlipidemia: Secondary | ICD-10-CM | POA: Diagnosis not present

## 2020-07-03 DIAGNOSIS — Z9181 History of falling: Secondary | ICD-10-CM | POA: Diagnosis not present

## 2020-07-03 DIAGNOSIS — I4891 Unspecified atrial fibrillation: Secondary | ICD-10-CM | POA: Diagnosis not present

## 2020-07-03 DIAGNOSIS — G2581 Restless legs syndrome: Secondary | ICD-10-CM | POA: Diagnosis not present

## 2020-07-03 DIAGNOSIS — M5136 Other intervertebral disc degeneration, lumbar region: Secondary | ICD-10-CM | POA: Diagnosis not present

## 2020-07-03 DIAGNOSIS — I5032 Chronic diastolic (congestive) heart failure: Secondary | ICD-10-CM | POA: Diagnosis not present

## 2020-07-03 DIAGNOSIS — E538 Deficiency of other specified B group vitamins: Secondary | ICD-10-CM | POA: Diagnosis not present

## 2020-07-03 DIAGNOSIS — Z7982 Long term (current) use of aspirin: Secondary | ICD-10-CM | POA: Diagnosis not present

## 2020-07-03 NOTE — Progress Notes (Signed)
KATTI, PELLE ([id number]) Visit Report for 06/30/2020 Arrival Information Details Patient Name: [name], [name]. Date of Service: 06/30/2020 11:15 AM Medical Record Number: [id number] Patient Account Number: [id number] Date of Birth/Sex: Mar 01, 1930 (87 y.o. F) Treating RN: Carlene Coria Primary Care Zamya Culhane: Ria Bush Other Clinician: Jeanine Luz Referring Ottis Sarnowski: Ria Bush Treating Hansford Hirt/Extender: Skipper Cliche in Treatment: 37 Visit Information History Since Last Visit Added or deleted any medications: No Patient Arrived: [name] Had a fall or experienced change in No Arrival Time: 11:19 activities of daily living that may affect Accompanied By: daughter risk of falls: Transfer Assistance: None Hospitalized since last visit: No Patient Identification Verified: Yes Pain Present Now: No Secondary Verification Process Completed: Yes Patient Requires Transmission-Based No Precautions: Patient Has Alerts: Yes Patient Alerts: 08/31/19 ABI R)1.3 L) 1.31 08/31/19 TBI R).61 L).58 Electronic Signature(s) Signed: 06/30/2020 2:45:39 PM By: Jeanine Luz Entered By: Jeanine Luz on 06/30/2020 11:20:06 [name] ([id number]) -------------------------------------------------------------------------------- Clinic Level of Care Assessment Details Patient Name: [name] Date of Service: 06/30/2020 11:15 AM Medical Record Number: [id number] Patient Account Number: [id number] Date of Birth/Sex: February 29, 1930 (87 y.o. F) Treating RN: Carlene Coria Primary Care Claryssa Sandner: Ria Bush Other Clinician: Jeanine Luz Referring Hadar Elgersma: Ria Bush Treating Maron Stanzione/Extender: Skipper Cliche in Treatment: 37 Clinic Level of Care Assessment Items TOOL 1 Quantity Score []  - Use when EandM and Procedure is performed on INITIAL visit 0 ASSESSMENTS - Nursing Assessment / Reassessment []  - General Physical Exam (combine w/ comprehensive  assessment (listed just below) when performed on new 0 pt. evals) []  - 0 Comprehensive Assessment (HX, ROS, Risk Assessments, Wounds Hx, etc.) ASSESSMENTS - Wound and Skin Assessment / Reassessment []  - Dermatologic / Skin Assessment (not related to wound area) 0 ASSESSMENTS - Ostomy and/or Continence Assessment and Care []  - Incontinence Assessment and Management 0 []  - 0 Ostomy Care Assessment and Management (repouching, etc.) PROCESS - Coordination of Care []  - Simple Patient / Family Education for ongoing care 0 []  - 0 Complex (extensive) Patient / Family Education for ongoing care []  - 0 Staff obtains Programmer, systems, Records, Test Results / Process Orders []  - 0 Staff telephones HHA, Nursing Homes / Clarify orders / etc []  - 0 Routine Transfer to another Facility (non-emergent condition) []  - 0 Routine Hospital Admission (non-emergent condition) []  - 0 New Admissions / Biomedical engineer / Ordering NPWT, Apligraf, etc. []  - 0 Emergency Hospital Admission (emergent condition) PROCESS - Special Needs []  - Pediatric / Minor Patient Management 0 []  - 0 Isolation Patient Management []  - 0 Hearing / Language / Visual special needs []  - 0 Assessment of Community assistance (transportation, D/C planning, etc.) []  - 0 Additional assistance / Altered mentation []  - 0 Support Surface(s) Assessment (bed, cushion, seat, etc.) INTERVENTIONS - Miscellaneous []  - External ear exam 0 []  - 0 Patient Transfer (multiple staff / Civil Service fast streamer / Similar devices) []  - 0 Simple Staple / Suture removal (25 or less) []  - 0 Complex Staple / Suture removal (26 or more) []  - 0 Hypo/Hyperglycemic Management (do not check if billed separately) []  - 0 Ankle / Brachial Index (ABI) - do not check if billed separately Has the patient been seen at the hospital within the last three years: Yes Total Score: 0 Level Of Care: ____ [name] ([id number]) Electronic Signature(s) Signed:  07/03/2020 5:08:01 PM By: Carlene Coria RN Entered By: Carlene Coria on 06/30/2020 12:38:16 [name] ([id number]) -------------------------------------------------------------------------------- Lower Extremity Assessment Details Patient Name: [name],  Crystal S. Date of Service: 06/30/2020 11:15 AM Medical Record Number: [id number] Patient Account Number: [id number] Date of Birth/Sex: 09/30/1929 (87 y.o. F) Treating RN: Carlene Coria Primary Care Keyaan Lederman: Ria Bush Other Clinician: Jeanine Luz Referring Tammee Thielke: Ria Bush Treating Brandun Pinn/Extender: Jeri Cos Weeks in Treatment: 37 Electronic Signature(s) Signed: 06/30/2020 2:45:39 PM By: Jeanine Luz Signed: 07/03/2020 5:08:01 PM By: Carlene Coria RN Entered By: Jeanine Luz on 06/30/2020 11:23:04 [name] ([id number]) -------------------------------------------------------------------------------- Multi Wound Chart Details Patient Name: [name], [name]. Date of Service: 06/30/2020 11:15 AM Medical Record Number: [id number] Patient Account Number: [id number] Date of Birth/Sex: 01/04/1930 (87 y.o. F) Treating RN: Carlene Coria Primary Care Kanae Ignatowski: Ria Bush Other Clinician: Jeanine Luz Referring Hazley Dezeeuw: Ria Bush Treating Miqueas Whilden/Extender: Skipper Cliche in Treatment: 37 Vital Signs Height(in): 62 Pulse(bpm): 31 Weight(lbs): 140 Blood Pressure(mmHg): 147/74 Body Mass Index(BMI): 26 Temperature(F): 98.2 Respiratory Rate(breaths/min): 18 Photos: [N/A:N/A] Wound Location: Right Metatarsal head fifth N/A N/A Wounding Event: Gradually Appeared N/A N/A Primary Etiology: Arterial Insufficiency Ulcer N/A N/A Comorbid History: Arrhythmia, Osteoarthritis N/A N/A Date Acquired: 04/09/2019 N/A N/A Weeks of Treatment: 37 N/A N/A Wound Status: Open N/A N/A Measurements L x W x D (cm) 0.1x0.1x0.1 N/A N/A Area (cm) : 0.008 N/A N/A Volume (cm) : 0.001 N/A N/A % Reduction  in Area: 99.30% N/A N/A % Reduction in Volume: 99.10% N/A N/A Classification: Full Thickness Without Exposed N/A N/A Support Structures Exudate Amount: None Present N/A N/A Wound Margin: Distinct, outline attached N/A N/A Granulation Amount: Small (1-33%) N/A N/A Granulation Quality: Pink N/A N/A Necrotic Amount: Small (1-33%) N/A N/A Exposed Structures: Fat Layer (Subcutaneous Tissue): N/A N/A Yes Fascia: No Tendon: No Muscle: No Joint: No Bone: No Epithelialization: Small (1-33%) N/A N/A Treatment Notes Electronic Signature(s) Signed: 07/03/2020 5:08:01 PM By: Carlene Coria RN Entered By: Carlene Coria on 06/30/2020 11:39:52 [name] ([id number]) -------------------------------------------------------------------------------- [place] Details Patient Name: [name] Date of Service: 06/30/2020 11:15 AM Medical Record Number: [id number] Patient Account Number: [id number] Date of Birth/Sex: [birth year] (87 y.o. F) Treating RN: Carlene Coria Primary Care Azell Bill: Ria Bush Other Clinician: Jeanine Luz Referring Kainat Pizana: Ria Bush Treating Dniyah Grant/Extender: Skipper Cliche in Treatment: 37 Active Inactive Abuse / Safety / Falls / Self Care Management Nursing Diagnoses: Potential for falls Goals: Patient/caregiver will verbalize understanding of skin care regimen Date Initiated: 10/12/2019 Target Resolution Date: 07/10/2020 Goal Status: Active Interventions: Assess fall risk on admission and as needed Notes: Electronic Signature(s) Signed: 07/03/2020 5:08:01 PM By: Carlene Coria RN Entered By: Carlene Coria on 06/30/2020 11:39:20 [name] ([id number]) -------------------------------------------------------------------------------- Pain Assessment Details Patient Name: [name]. Date of Service: 06/30/2020 11:15 AM Medical Record Number: [id number] Patient Account Number: [id number] Date of Birth/Sex:  05-31-1929 (87 y.o. F) Treating RN: Carlene Coria Primary Care Salvatore Shear: Ria Bush Other Clinician: Jeanine Luz Referring Klaira Pesci: Ria Bush Treating Haywood Meinders/Extender: Skipper Cliche in Treatment: 37 Active Problems Location of Pain Severity and Description of Pain Patient Has Paino No Site Locations Pain Management and Medication Current Pain Management: Electronic Signature(s) Signed: 06/30/2020 2:45:39 PM By: Jeanine Luz Signed: 07/03/2020 5:08:01 PM By: Carlene Coria RN Entered By: Jeanine Luz on 06/30/2020 11:22:50 [name] ([id number]) -------------------------------------------------------------------------------- Patient/Caregiver Education Details Patient Name: [name] Date of Service: 06/30/2020 11:15 AM Medical Record Number: [id number] Patient Account Number: [id number] Date of Birth/Gender: Nov 28, 1929 (87 y.o. F) Treating RN: Carlene Coria Primary Care Physician: Ria Bush Other Clinician: Jeanine Luz Referring Physician: Ria Bush Treating Physician/Extender: Jeri Cos  Weeks in Treatment: 37 Education Assessment Education Provided To: Patient Education Topics Provided Wound/Skin Impairment: Methods: Explain/Verbal Responses: State content correctly [employer]) Signed: 07/03/2020 5:08:01 PM By: Carlene Coria RN Entered By: Carlene Coria on 06/30/2020 12:38:55 [name] ([id number]) -------------------------------------------------------------------------------- Wound Assessment Details Patient Name: [name]. Date of Service: 06/30/2020 11:15 AM Medical Record Number: [id number] Patient Account Number: [id number] Date of Birth/Sex: 1929-11-02 (87 y.o. F) Treating RN: Carlene Coria Primary Care Rafaelita Foister: Ria Bush Other Clinician: Jeanine Luz Referring Chukwudi Ewen: Ria Bush Treating Imer Foxworth/Extender: Skipper Cliche in Treatment: 34 Wound  Status Wound Number: 3 Primary Etiology: Arterial Insufficiency Ulcer Wound Location: Right Metatarsal head fifth Wound Status: Open Wounding Event: Gradually Appeared Comorbid History: Arrhythmia, Osteoarthritis Date Acquired: 04/09/2019 Weeks Of Treatment: 37 Clustered Wound: No Photos Wound Measurements Length: (cm) 0.1 Width: (cm) 0.1 Depth: (cm) 0.1 Area: (cm) 0.008 Volume: (cm) 0.001 % Reduction in Area: 99.3% % Reduction in Volume: 99.1% Epithelialization: Small (1-33%) Tunneling: No Undermining: No Wound Description Classification: Full Thickness Without Exposed Support Structure Wound Margin: Distinct, outline attached Exudate Amount: None Present s Foul Odor After Cleansing: No Slough/Fibrino Yes Wound Bed Granulation Amount: Small (1-33%) Exposed Structure Granulation Quality: Pink Fascia Exposed: No Necrotic Amount: Small (1-33%) Fat Layer (Subcutaneous Tissue) Exposed: Yes Necrotic Quality: Adherent Slough Tendon Exposed: No Muscle Exposed: No Joint Exposed: No Bone Exposed: No Electronic Signature(s) Signed: 06/30/2020 2:45:39 PM By: Jeanine Luz Signed: 07/03/2020 5:08:01 PM By: Carlene Coria RN Entered By: Jeanine Luz on 06/30/2020 11:29:43 [name] ([id number]) -------------------------------------------------------------------------------- Vitals Details Patient Name: [name]. Date of Service: 06/30/2020 11:15 AM Medical Record Number: [id number] Patient Account Number: [id number] Date of Birth/Sex: Jun 01, 1929 (87 y.o. F) Treating RN: Carlene Coria Primary Care Salimata Christenson: Ria Bush Other Clinician: Jeanine Luz Referring Mauricio Dahlen: Ria Bush Treating Thad Osoria/Extender: Skipper Cliche in Treatment: 37 Vital Signs Time Taken: 11:20 Temperature (F): 98.2 Height (in): 62 Pulse (bpm): 76 Weight (lbs): 140 Respiratory Rate (breaths/min): 18 Body Mass Index (BMI): 25.6 Blood Pressure (mmHg):  147/74 Reference Range: 80 - 120 mg / dl Electronic Signature(s) Signed: 06/30/2020 2:45:39 PM By: Jeanine Luz Entered By: Jeanine Luz on 06/30/2020 11:22:43

## 2020-07-04 ENCOUNTER — Other Ambulatory Visit: Payer: Self-pay

## 2020-07-04 ENCOUNTER — Ambulatory Visit (INDEPENDENT_AMBULATORY_CARE_PROVIDER_SITE_OTHER): Payer: Medicare Other

## 2020-07-04 ENCOUNTER — Ambulatory Visit: Payer: Medicare Other | Admitting: Podiatry

## 2020-07-04 DIAGNOSIS — Z792 Long term (current) use of antibiotics: Secondary | ICD-10-CM | POA: Diagnosis not present

## 2020-07-04 DIAGNOSIS — K219 Gastro-esophageal reflux disease without esophagitis: Secondary | ICD-10-CM | POA: Diagnosis not present

## 2020-07-04 DIAGNOSIS — M5136 Other intervertebral disc degeneration, lumbar region: Secondary | ICD-10-CM | POA: Diagnosis not present

## 2020-07-04 DIAGNOSIS — L97513 Non-pressure chronic ulcer of other part of right foot with necrosis of muscle: Secondary | ICD-10-CM | POA: Diagnosis not present

## 2020-07-04 DIAGNOSIS — E7849 Other hyperlipidemia: Secondary | ICD-10-CM | POA: Diagnosis not present

## 2020-07-04 DIAGNOSIS — G2581 Restless legs syndrome: Secondary | ICD-10-CM | POA: Diagnosis not present

## 2020-07-04 DIAGNOSIS — Z9181 History of falling: Secondary | ICD-10-CM | POA: Diagnosis not present

## 2020-07-04 DIAGNOSIS — E538 Deficiency of other specified B group vitamins: Secondary | ICD-10-CM | POA: Diagnosis not present

## 2020-07-04 DIAGNOSIS — Z7982 Long term (current) use of aspirin: Secondary | ICD-10-CM | POA: Diagnosis not present

## 2020-07-04 DIAGNOSIS — E559 Vitamin D deficiency, unspecified: Secondary | ICD-10-CM | POA: Diagnosis not present

## 2020-07-04 DIAGNOSIS — I272 Pulmonary hypertension, unspecified: Secondary | ICD-10-CM | POA: Diagnosis not present

## 2020-07-04 DIAGNOSIS — G9009 Other idiopathic peripheral autonomic neuropathy: Secondary | ICD-10-CM | POA: Diagnosis not present

## 2020-07-04 DIAGNOSIS — I2510 Atherosclerotic heart disease of native coronary artery without angina pectoris: Secondary | ICD-10-CM | POA: Diagnosis not present

## 2020-07-04 DIAGNOSIS — S62102D Fracture of unspecified carpal bone, left wrist, subsequent encounter for fracture with routine healing: Secondary | ICD-10-CM | POA: Diagnosis not present

## 2020-07-04 DIAGNOSIS — M1990 Unspecified osteoarthritis, unspecified site: Secondary | ICD-10-CM | POA: Diagnosis not present

## 2020-07-04 DIAGNOSIS — M810 Age-related osteoporosis without current pathological fracture: Secondary | ICD-10-CM

## 2020-07-04 DIAGNOSIS — N1831 Chronic kidney disease, stage 3a: Secondary | ICD-10-CM | POA: Diagnosis not present

## 2020-07-04 DIAGNOSIS — I130 Hypertensive heart and chronic kidney disease with heart failure and stage 1 through stage 4 chronic kidney disease, or unspecified chronic kidney disease: Secondary | ICD-10-CM | POA: Diagnosis not present

## 2020-07-04 DIAGNOSIS — I872 Venous insufficiency (chronic) (peripheral): Secondary | ICD-10-CM | POA: Diagnosis not present

## 2020-07-04 DIAGNOSIS — I5032 Chronic diastolic (congestive) heart failure: Secondary | ICD-10-CM | POA: Diagnosis not present

## 2020-07-04 DIAGNOSIS — I4891 Unspecified atrial fibrillation: Secondary | ICD-10-CM | POA: Diagnosis not present

## 2020-07-04 DIAGNOSIS — Z7901 Long term (current) use of anticoagulants: Secondary | ICD-10-CM | POA: Diagnosis not present

## 2020-07-04 MED ORDER — DENOSUMAB 60 MG/ML ~~LOC~~ SOSY
60.0000 mg | PREFILLED_SYRINGE | Freq: Once | SUBCUTANEOUS | Status: AC
  Administered 2020-07-04: 60 mg via SUBCUTANEOUS

## 2020-07-04 NOTE — Progress Notes (Signed)
Per orders of Danise Mina, injection of Prolia, given by Aneta Mins, RN. Patient tolerated injection well in R arm.

## 2020-07-06 DIAGNOSIS — M1990 Unspecified osteoarthritis, unspecified site: Secondary | ICD-10-CM | POA: Diagnosis not present

## 2020-07-06 DIAGNOSIS — Z7901 Long term (current) use of anticoagulants: Secondary | ICD-10-CM | POA: Diagnosis not present

## 2020-07-06 DIAGNOSIS — L97513 Non-pressure chronic ulcer of other part of right foot with necrosis of muscle: Secondary | ICD-10-CM | POA: Diagnosis not present

## 2020-07-06 DIAGNOSIS — M5136 Other intervertebral disc degeneration, lumbar region: Secondary | ICD-10-CM | POA: Diagnosis not present

## 2020-07-06 DIAGNOSIS — Z792 Long term (current) use of antibiotics: Secondary | ICD-10-CM | POA: Diagnosis not present

## 2020-07-06 DIAGNOSIS — G9009 Other idiopathic peripheral autonomic neuropathy: Secondary | ICD-10-CM | POA: Diagnosis not present

## 2020-07-06 DIAGNOSIS — M810 Age-related osteoporosis without current pathological fracture: Secondary | ICD-10-CM | POA: Diagnosis not present

## 2020-07-06 DIAGNOSIS — E559 Vitamin D deficiency, unspecified: Secondary | ICD-10-CM | POA: Diagnosis not present

## 2020-07-06 DIAGNOSIS — I130 Hypertensive heart and chronic kidney disease with heart failure and stage 1 through stage 4 chronic kidney disease, or unspecified chronic kidney disease: Secondary | ICD-10-CM | POA: Diagnosis not present

## 2020-07-06 DIAGNOSIS — I5032 Chronic diastolic (congestive) heart failure: Secondary | ICD-10-CM | POA: Diagnosis not present

## 2020-07-06 DIAGNOSIS — I872 Venous insufficiency (chronic) (peripheral): Secondary | ICD-10-CM | POA: Diagnosis not present

## 2020-07-06 DIAGNOSIS — E538 Deficiency of other specified B group vitamins: Secondary | ICD-10-CM | POA: Diagnosis not present

## 2020-07-06 DIAGNOSIS — I272 Pulmonary hypertension, unspecified: Secondary | ICD-10-CM | POA: Diagnosis not present

## 2020-07-06 DIAGNOSIS — E7849 Other hyperlipidemia: Secondary | ICD-10-CM | POA: Diagnosis not present

## 2020-07-06 DIAGNOSIS — K219 Gastro-esophageal reflux disease without esophagitis: Secondary | ICD-10-CM | POA: Diagnosis not present

## 2020-07-06 DIAGNOSIS — S62102D Fracture of unspecified carpal bone, left wrist, subsequent encounter for fracture with routine healing: Secondary | ICD-10-CM | POA: Diagnosis not present

## 2020-07-06 DIAGNOSIS — I2510 Atherosclerotic heart disease of native coronary artery without angina pectoris: Secondary | ICD-10-CM | POA: Diagnosis not present

## 2020-07-06 DIAGNOSIS — I4891 Unspecified atrial fibrillation: Secondary | ICD-10-CM | POA: Diagnosis not present

## 2020-07-06 DIAGNOSIS — Z7982 Long term (current) use of aspirin: Secondary | ICD-10-CM | POA: Diagnosis not present

## 2020-07-06 DIAGNOSIS — G2581 Restless legs syndrome: Secondary | ICD-10-CM | POA: Diagnosis not present

## 2020-07-06 DIAGNOSIS — Z9181 History of falling: Secondary | ICD-10-CM | POA: Diagnosis not present

## 2020-07-06 DIAGNOSIS — N1831 Chronic kidney disease, stage 3a: Secondary | ICD-10-CM | POA: Diagnosis not present

## 2020-07-07 ENCOUNTER — Ambulatory Visit: Payer: Medicare Other | Admitting: Physician Assistant

## 2020-07-07 ENCOUNTER — Ambulatory Visit (INDEPENDENT_AMBULATORY_CARE_PROVIDER_SITE_OTHER): Payer: Medicare Other

## 2020-07-07 ENCOUNTER — Other Ambulatory Visit: Payer: Self-pay

## 2020-07-07 VITALS — BP 143/72 | HR 66 | Temp 97.7°F | Resp 16

## 2020-07-07 DIAGNOSIS — D509 Iron deficiency anemia, unspecified: Secondary | ICD-10-CM

## 2020-07-07 MED ORDER — SODIUM CHLORIDE 0.9 % IV SOLN: Freq: Once | INTRAVENOUS | Status: DC | PRN

## 2020-07-07 MED ORDER — FAMOTIDINE IN NACL 20-0.9 MG/50ML-% IV SOLN: 20.0000 mg | Freq: Once | INTRAVENOUS | Status: DC | PRN

## 2020-07-07 MED ORDER — DIPHENHYDRAMINE HCL 50 MG/ML IJ SOLN: 50.0000 mg | Freq: Once | INTRAMUSCULAR | Status: DC | PRN

## 2020-07-07 MED ORDER — ALBUTEROL SULFATE HFA 108 (90 BASE) MCG/ACT IN AERS: 2.0000 | INHALATION_SPRAY | Freq: Once | RESPIRATORY_TRACT | Status: DC | PRN

## 2020-07-07 MED ORDER — SODIUM CHLORIDE 0.9 % IV SOLN
510.0000 mg | Freq: Once | INTRAVENOUS | Status: AC
  Administered 2020-07-07: 510 mg via INTRAVENOUS
  Filled 2020-07-07: qty 17

## 2020-07-07 MED ORDER — EPINEPHRINE 0.3 MG/0.3ML IJ SOAJ: 0.3000 mg | Freq: Once | INTRAMUSCULAR | Status: DC | PRN

## 2020-07-07 MED ORDER — METHYLPREDNISOLONE SODIUM SUCC 125 MG IJ SOLR: 125.0000 mg | Freq: Once | INTRAMUSCULAR | Status: DC | PRN

## 2020-07-07 NOTE — Progress Notes (Signed)
Diagnosis: Other: Iron Deficiency Anemia  Provider:  Marshell Garfinkel, MD  Procedure: Infusion  IV Type: Peripheral, IV Location: L Antecubital  Feraheme (Ferumoxytol), 510 mgs  Infusion start time: 09:36  Infusion stop time: 10:00  Post Infusion IV Care: Observation period completed  Discharge: Condition: Good, Destination: Home . AVS provided to patient.   Performed by:  Paul Dykes, RN

## 2020-07-10 ENCOUNTER — Other Ambulatory Visit: Payer: Self-pay

## 2020-07-10 ENCOUNTER — Other Ambulatory Visit
Admission: RE | Admit: 2020-07-10 | Discharge: 2020-07-10 | Disposition: A | Discharge status: Home / Self Care | Payer: Medicare Other | Source: Ambulatory Visit | Attending: Physician Assistant | Admitting: Physician Assistant

## 2020-07-10 ENCOUNTER — Other Ambulatory Visit: Payer: Self-pay | Admitting: Physician Assistant

## 2020-07-10 ENCOUNTER — Encounter: Payer: Medicare Other | Attending: Physician Assistant | Admitting: Physician Assistant

## 2020-07-10 DIAGNOSIS — I872 Venous insufficiency (chronic) (peripheral): Secondary | ICD-10-CM | POA: Diagnosis not present

## 2020-07-10 DIAGNOSIS — Z7901 Long term (current) use of anticoagulants: Secondary | ICD-10-CM | POA: Diagnosis not present

## 2020-07-10 DIAGNOSIS — A4901 Methicillin susceptible Staphylococcus aureus infection, unspecified site: Secondary | ICD-10-CM | POA: Diagnosis not present

## 2020-07-10 DIAGNOSIS — M810 Age-related osteoporosis without current pathological fracture: Secondary | ICD-10-CM | POA: Diagnosis not present

## 2020-07-10 DIAGNOSIS — Z792 Long term (current) use of antibiotics: Secondary | ICD-10-CM | POA: Diagnosis not present

## 2020-07-10 DIAGNOSIS — L0889 Other specified local infections of the skin and subcutaneous tissue: Secondary | ICD-10-CM | POA: Insufficient documentation

## 2020-07-10 DIAGNOSIS — L97513 Non-pressure chronic ulcer of other part of right foot with necrosis of muscle: Secondary | ICD-10-CM | POA: Diagnosis not present

## 2020-07-10 DIAGNOSIS — E7849 Other hyperlipidemia: Secondary | ICD-10-CM | POA: Diagnosis not present

## 2020-07-10 DIAGNOSIS — G2581 Restless legs syndrome: Secondary | ICD-10-CM | POA: Diagnosis not present

## 2020-07-10 DIAGNOSIS — B353 Tinea pedis: Secondary | ICD-10-CM | POA: Diagnosis not present

## 2020-07-10 DIAGNOSIS — I2510 Atherosclerotic heart disease of native coronary artery without angina pectoris: Secondary | ICD-10-CM | POA: Diagnosis not present

## 2020-07-10 DIAGNOSIS — I70235 Atherosclerosis of native arteries of right leg with ulceration of other part of foot: Secondary | ICD-10-CM | POA: Diagnosis not present

## 2020-07-10 DIAGNOSIS — Z7982 Long term (current) use of aspirin: Secondary | ICD-10-CM | POA: Diagnosis not present

## 2020-07-10 DIAGNOSIS — I4891 Unspecified atrial fibrillation: Secondary | ICD-10-CM | POA: Diagnosis not present

## 2020-07-10 DIAGNOSIS — M5136 Other intervertebral disc degeneration, lumbar region: Secondary | ICD-10-CM | POA: Diagnosis not present

## 2020-07-10 DIAGNOSIS — I480 Paroxysmal atrial fibrillation: Secondary | ICD-10-CM | POA: Diagnosis not present

## 2020-07-10 DIAGNOSIS — L97512 Non-pressure chronic ulcer of other part of right foot with fat layer exposed: Secondary | ICD-10-CM | POA: Insufficient documentation

## 2020-07-10 DIAGNOSIS — M86671 Other chronic osteomyelitis, right ankle and foot: Secondary | ICD-10-CM | POA: Insufficient documentation

## 2020-07-10 DIAGNOSIS — N1831 Chronic kidney disease, stage 3a: Secondary | ICD-10-CM | POA: Diagnosis not present

## 2020-07-10 DIAGNOSIS — I272 Pulmonary hypertension, unspecified: Secondary | ICD-10-CM | POA: Diagnosis not present

## 2020-07-10 DIAGNOSIS — I130 Hypertensive heart and chronic kidney disease with heart failure and stage 1 through stage 4 chronic kidney disease, or unspecified chronic kidney disease: Secondary | ICD-10-CM | POA: Diagnosis not present

## 2020-07-10 DIAGNOSIS — I739 Peripheral vascular disease, unspecified: Secondary | ICD-10-CM | POA: Insufficient documentation

## 2020-07-10 DIAGNOSIS — I5032 Chronic diastolic (congestive) heart failure: Secondary | ICD-10-CM | POA: Diagnosis not present

## 2020-07-10 DIAGNOSIS — L97519 Non-pressure chronic ulcer of other part of right foot with unspecified severity: Secondary | ICD-10-CM | POA: Diagnosis present

## 2020-07-10 DIAGNOSIS — K219 Gastro-esophageal reflux disease without esophagitis: Secondary | ICD-10-CM | POA: Diagnosis not present

## 2020-07-10 DIAGNOSIS — Z9181 History of falling: Secondary | ICD-10-CM | POA: Diagnosis not present

## 2020-07-10 DIAGNOSIS — M1990 Unspecified osteoarthritis, unspecified site: Secondary | ICD-10-CM | POA: Diagnosis not present

## 2020-07-10 DIAGNOSIS — E559 Vitamin D deficiency, unspecified: Secondary | ICD-10-CM | POA: Diagnosis not present

## 2020-07-10 DIAGNOSIS — S62102D Fracture of unspecified carpal bone, left wrist, subsequent encounter for fracture with routine healing: Secondary | ICD-10-CM | POA: Diagnosis not present

## 2020-07-10 DIAGNOSIS — E538 Deficiency of other specified B group vitamins: Secondary | ICD-10-CM | POA: Diagnosis not present

## 2020-07-10 DIAGNOSIS — G9009 Other idiopathic peripheral autonomic neuropathy: Secondary | ICD-10-CM | POA: Diagnosis not present

## 2020-07-10 NOTE — Progress Notes (Addendum)
[name], [name] ([id number]) Visit Report for 07/10/2020 Chief Complaint Document Details Patient Name: [name], [name]. Date of Service: 07/10/2020 3:45 PM Medical Record Number: [id number] Patient Account Number: [id number] Date of Birth/Sex: 1929-12-06 (87 y.o. F) Treating RN: Carlene Coria Primary Care Provider: Ria Bush Other Clinician: Jeanine Luz Referring Provider: Ria Bush Treating Provider/Extender: Skipper Cliche in Treatment: 38 Information Obtained from: Patient Chief Complaint Right Lateral Foot Ulcer Electronic Signature(s) Signed: 07/10/2020 4:09:36 PM By: Worthy Keeler PA-C Entered By: Worthy Keeler on 07/10/2020 16:09:35 [name], [name] ([id number]) -------------------------------------------------------------------------------- Debridement Details Patient Name: [name] Date of Service: 07/10/2020 3:45 PM Medical Record Number: [id number] Patient Account Number: [id number] Date of Birth/Sex: 14-Jun-1929 (87 y.o. F) Treating RN: Carlene Coria Primary Care Provider: Ria Bush Other Clinician: Jeanine Luz Referring Provider: Ria Bush Treating Provider/Extender: Skipper Cliche in Treatment: 38 Debridement Performed for Wound #3 Right Metatarsal head fifth Assessment: Performed By: Physician Tommie Sams., PA-C Debridement Type: Debridement Severity of Tissue Pre Debridement: Fat layer exposed Level of Consciousness (Pre- Awake and Alert procedure): Pre-procedure Verification/Time Out Yes - 16:11 Taken: Start Time: 16:11 Pain Control: Lidocaine 4% Topical Solution Total Area Debrided (L x W): 0.2 (cm) x 0.2 (cm) = 0.04 (cm) Tissue and other material Viable, Non-Viable, Bone, Slough, Subcutaneous, Skin: Dermis , Skin: Epidermis, Slough debrided: Level: Skin/Subcutaneous Tissue/Muscle/Bone Debridement Description: Excisional Instrument: Curette Specimen: Swab, Tissue Culture Number of Specimens  Taken: 2 Bleeding: Minimum Hemostasis Achieved: Pressure End Time: 16:15 Procedural Pain: 0 Post Procedural Pain: 0 Response to Treatment: Procedure was tolerated well Level of Consciousness (Post- Awake and Alert procedure): Post Debridement Measurements of Total Wound Length: (cm) 0.2 Width: (cm) 0.2 Depth: (cm) 0.4 Volume: (cm) 0.013 Character of Wound/Ulcer Post Debridement: Improved Severity of Tissue Post Debridement: Fat layer exposed Post Procedure Diagnosis Same as Pre-procedure Electronic Signature(s) Signed: 07/10/2020 5:03:26 PM By: Worthy Keeler PA-C Signed: 07/14/2020 8:10:37 AM By: Carlene Coria RN Entered By: Carlene Coria on 07/10/2020 16:23:27 [name] ([id number]) -------------------------------------------------------------------------------- HPI Details Patient Name: [name]. Date of Service: 07/10/2020 3:45 PM Medical Record Number: [id number] Patient Account Number: [id number] Date of Birth/Sex: 12/20/1929 (87 y.o. F) Treating RN: Carlene Coria Primary Care Provider: Ria Bush Other Clinician: Jeanine Luz Referring Provider: Ria Bush Treating Provider/Extender: Skipper Cliche in Treatment: 38 History of Present Illness HPI Description: 10/12/2019 upon evaluation today patient appears to be doing somewhat poorly in regard to her wound at this point. She has a ulcer over the right fifth metatarsal region. This has been managed by podiatry up to this point. Unfortunately the patient just does not seem to be making the progress that they were hoping for an subsequently the patient was referred to [place] by [facility] Dr. Posey Pronto. With that being said I did note that the patient does have peripheral vascular disease which has been managed by Dr. Lazaro Arms her most recent angiogram with intervention was on 08/02/2019. The ABIs following were on the right 1.30 with a TBI of 0.61 normal left 1.31 with a TBI of 0.58.  Obviously things seem to be doing somewhat better at this point hopefully enough to allow her to heal. She is on anticoagulant therapy due to atrial fibrillation long-term. With that being said the patient is unfortunately having quite a bit of discomfort at this time secondary to the wound. She also has erythema which is very likely secondary to infection. I am going obtain a culture of  the area today once debridement is complete. Apparently the patient also had a x-ray that was performed by Dr. Posey Pronto on 09/21/2019 and this showed that the patient had no obvious signs on x-ray of cortical irregularity and no osteomyelitis obviously noted. With that being said as I discussed with the patient's daughter as well this does not indicate that the patient absolutely has no osteomyelitis as x-rays are only at best 50% helpful in identifying this complication. Nonetheless I do believe that we need to have the patient continue to have this further evaluated specifically I am to recommend that we check into an MRI which I think would be ideal to evaluate whether or not there is osteomyelitis present. 7/16; this is a patient with an ulcer over the right fifth metatarsal head. She follows with Dr. Lucky Cowboy [facility]. She is due to have an MRI of the foot on 7/26. Using Santyl to the wound 11/04/2019 on evaluation today patient appears to be doing okay in regard to her wound at this time. Unfortunately since I last saw her which was actually her second visit here in the clinic she has moved into a new apartment and then subsequently had a fall. She ended up in the hospital she did fracture her left arm and subsequently has also been dealing with a lot of dizziness she has been at [employer]. She tells me that getting in the change the dressing has been somewhat difficult as far as daily dressing changes are concerned with the Santyl. Nonetheless when they do change it that is what is been used and her  daughter-in-law has also been coming in to help out as well with the dressing changes to try to make sure it gets done daily. Fortunately there is no signs of active infection at this time. No fever chills noted. I do believe the Santyl has been beneficial and is likely still the best way to go at this point for the patient. I did review the patient's MRI which showed some potential for reactive osteitis but did not show any signs of osteomyelitis overtly. That cannot be excluded but again right now it does not appear that is very likely present as well. This is good news. I also did review her culture she has been on the Bactrim she tells me she has taken this and again that was appropriate based on the results of her culture. 11/19/2019 upon evaluation today patient appears to be doing decently well in regard to her wound. Fortunately there is no signs of active infection at this time wound is not significantly larger in fact is roughly about the same at this point. With that being said there is not as much erythema as previously noted I think the infection seems to be under much better control which is great news. Unfortunately she has fallen since I last saw her and broken her left arm she has an appointment with them following the appointment with me. 8/25; patient was worked in acutely today at the request of home health and was concerned about her right heel, generalized erythema in her feet bilaterally. According to her daughter things have been getting worse over the last for 5 days but not precisely from a wound care point of view. Her wound is on the lateral part of the right fifth metatarsal head looks about the same as I remember this. She has developed dryness and flaking of the skin on her plantar feet. As well a very tender  area localized over the insertion point of the Achilles tendon. She has restless leg syndrome. Does not specifically offload the area at night. She wears socks at  home and does not really give me a pressure on the right heel issue. I reviewed her vascular status. She underwent her last revascularization on 08/02/2019 by Dr. Lucky Cowboy. This included an angioplasty of the right peroneal artery as well as the right posterior tibial artery. Her last noninvasive arterial study was on 11/23/2019. On the right her ABI was 1.08 TBI of 0.48. On the left ABI at 1.21 with a TBI of 0.54. Family relates that Dr. Lucky Cowboy felt that he had done the best he could and this. We have been using Santyl to the one wound she has on the right lateral fifth metatarsal head. She does not have a new wound today which is the concern that letter coming into the clinic 12/09/2019 on evaluation today patient appears to be doing decently well in regard to her foot ulcer at this point. She does note that with the compression wrap that the home health nurse put on after contacting our clinic that she actually did much better and felt much better. Her heel is also improved. Fortunately there is no signs of active infection at this time. No fevers, chills, nausea, vomiting, or diarrhea. 12/24/2019 upon evaluation today patient appears to be doing somewhat poorly still in regard to her foot. Unfortunately this is just not showing the signs of improvement that I would like to see it is a little bit cleaner but also appears to be erythematous and I believe there is infection noted at this point. We will get a need to address this in my opinion. I did actually recommend that we switch back to the Iodoflex as well apparently the home health nurse had switch this away that not really what we want to do at this point in my opinion. They were using silver alginate I think most recently. 01/06/2020 upon evaluation today patient actually appears to be doing much better in regard to her wound. We have switched back to the silver alginate after her home health nurse contacted me and honestly I feel like she is doing quite  well in that regard. She does have a lot of drainage and therefore I think that is probably a better option for. With that being said she is tolerating the compression wrap without complication that also seems to be helping. 01/28/2020 on evaluation today patient appears to be doing well with regard to her foot ulcer. I do feel like she is showing signs of improvement which is great news. There is no signs of active infection at this time which is also good news. No fevers, chills, nausea, vomiting, or diarrhea. Unfortunately she did have a fall and sustained an injury to the left upper arm. She has a skin tear here this appears to be doing okay although [name], [name]. ([id number]) the dressing was stuck to this this was an alginate dressing. I really think she probably needs something like Xeroform to try to prevent anything from sticking to this region. Other than that she seems to be doing quite well. 02/11/2020 on evaluation today patient's arm skin tear appears to be completely healed which is great. Unfortunately she is still having issues at this time with her foot although this seems to be showing signs of improvement it still very slow. There is no signs of active infection at this time. 03/06/2020 upon evaluation today patient  appears to be doing decently well in regard to her foot ulcer. There is some minimal slough noted on the surface of the wound building up at this point. Otherwise I really feel like she is doing quite nicely with quite a bit of new granulation and epithelial growth. 03/23/2020 upon evaluation today patient appears to be doing well with regards to her wound. She has been tolerating the dressing changes without complication. Fortunately there is no evidence of active infection and overall feel like her foot ulcer is doing excellent at this point. 04/13/2020 on evaluation today patient appears to be doing well with regard to her foot ulcer. She has been tolerating the  dressing changes without complication. Fortunately there is no signs of active infection at this time. Overall I feel like the wound is getting close to complete closure and this appears to be doing quite well in my opinion. 04/27/2020 on evaluation today patient appears to be doing excellent in regard to her wound. She has been tolerating the dressing changes without complication. Fortunately there is no signs of active infection and overall very pleased with where things stand today. In fact I feel like the wound is very close to complete resolution. 05/18/2020 upon evaluation today patient appears to be doing well with regard to her foot ulcer. There is no evidence of active infection at this time which is great news and overall very pleased with where things stand. No fevers, chills, nausea, vomiting, or diarrhea. I do feel like she is making great progress and I feel like that the wound is very tiny but again this last portion getting close has been somewhat difficult. 07/05/2020 upon evaluation today patient appears to be doing a little poorly in regard to her foot ulcer. Fortunately there is no signs of active infection at this time systemically though locally I feel it may be some cellulitis it looks like this wound may have prematurely sealed up subsequently causing a blister that had to be removed. The good news is that I do not think it is a very bad infection but I believe she may be experiencing cellulitis. 06/16/2020 upon evaluation today patient appears to be doing well with regard to her wound. It seems like it showed signs of improvement compared to last week where she had more infection noted. With that being said I do believe that based on what I am seeing the patient seems to be making excellent progress although I think still this is a small area that is going require some debridement to clear away some of the callus and drainage which is trapping fluid behind it based on what I  see. 06/23/2020 upon evaluation today patient appears to be doing well with regard to her leg ulcer. She has been tolerating the dressing changes without application. And currently I do believe the alginate is doing a good job keeping it dry it still tends to cover over and we do not want her trapping any fluid therefore I do remove that outer callus/drainage covering each time she comes in. Overall I think this is doing a great job. 06/30/2020 patient appears to be doing okay in regard to pain in regard to her wound. Unfortunately she continues to develop a callus buildup over the area. This is unfortunate as to be honest it is continuing to cause problems with her basically being able to heal as she needs to really granulate in from the bottom out on that is not happening. I think we need to  find something that works better to keep this area open. 07/10/2020 upon evaluation today patient unfortunately appears to be having increased pain yet again and is not doing nearly as well as she was previous. I do believe this is indication of infection. The patient is going to require a culture today it appears. Electronic Signature(s) Signed: 07/10/2020 4:56:09 PM By: Worthy Keeler PA-C Entered By: Worthy Keeler on 07/10/2020 16:56:08 [name] ([id number]) -------------------------------------------------------------------------------- Physical Exam Details Patient Name: [name], [name]. Date of Service: 07/10/2020 3:45 PM Medical Record Number: [id number] Patient Account Number: [id number] Date of Birth/Sex: 05/09/1929 (87 y.o. F) Treating RN: Carlene Coria Primary Care Provider: Ria Bush Other Clinician: Jeanine Luz Referring Provider: Ria Bush Treating Provider/Extender: Skipper Cliche in Treatment: 73 Constitutional Well-nourished and well-hydrated in no acute distress. Respiratory normal breathing without difficulty. Psychiatric this patient is able to make  decisions and demonstrates good insight into disease process. Alert and Oriented x 3. pleasant and cooperative. Notes Upon inspection as I was further evaluating for any signs of infection the patient did require debridement and subsequently after removing the callus I did actually find that there was also a portion of bone in the base of the wound that also removed fairly easily and appeared to be necrotic that I removed and sent for pathology as a sample. I think that skin to be necessary in order for [place] to determine if indeed this is osteomyelitis previously MRI stated no but again it appears that something may be going on here based on the physical exam findings especially on debridement. Also obtained a wound culture today. Electronic Signature(s) Signed: 07/10/2020 4:56:51 PM By: Worthy Keeler PA-C Entered By: Worthy Keeler on 07/10/2020 16:56:50 [name] ([id number]) -------------------------------------------------------------------------------- Physician Orders Details Patient Name: [name] Date of Service: 07/10/2020 3:45 PM Medical Record Number: [id number] Patient Account Number: [id number] Date of Birth/Sex: 04/07/1930 (87 y.o. F) Treating RN: Carlene Coria Primary Care Provider: Ria Bush Other Clinician: Jeanine Luz Referring Provider: Ria Bush Treating Provider/Extender: Skipper Cliche in Treatment: 73 Verbal / Phone Orders: No Diagnosis Coding ICD-10 Coding Code Description I73.89 Other specified peripheral vascular diseases L97.512 Non-pressure chronic ulcer of other part of right foot with fat layer exposed S41.102A Unspecified open wound of left upper arm, initial encounter Z79.01 Long term (current) use of anticoagulants I48.0 Paroxysmal atrial fibrillation B35.3 Tinea pedis Follow-up Appointments o Return Appointment in 1 week. [place]: - [facility] for wound care.  May utilize formulary equivalent dressing for wound treatment orders unless otherwise specified. Home Health Nurse may visit PRN to address patientos wound care needs. o Scheduled days for dressing changes to be completed; exception, patient has scheduled wound care visit that day. o **Please direct any NON-WOUND related issues/requests for orders to patient's Primary Care Physician. **If current dressing causes regression in wound condition, may D/C ordered dressing product/s and apply Normal Saline Moist Dressing daily until next [place] or Other MD appointment. **Notify [facility] of regression in wound condition at [phone]. Wound Treatment Wound #3 - Metatarsal head fifth Wound Laterality: Right Cleanser: Normal Saline (Generic) 3 x Per Week/30 Days Discharge Instructions: Wash your hands with soap and water. Remove old dressing, discard into plastic bag and place into trash. Cleanse the wound with Normal Saline prior to applying a clean dressing using gauze sponges, not tissues or cotton balls. Do not scrub or use excessive force. [place]  dry using gauze sponges, not tissue or cotton balls. Secondary Dressing: Mepilex Border Flex, 4x4 (in/in) (Generic) 3 x Per Week/30 Days Discharge Instructions: Apply to wound as directed. Do not cut. Secondary Dressing: Silvercel 4 1/4x 4 1/4 (in/in) (Generic) 3 x Per Week/30 Days Discharge Instructions: Apply Silvercel 4 1/4x 4 1/4 (in/in) as instructed Laboratory o Bacteria identified in Wound by Culture (MICRO) - non healing wound with increased pain and purlent drainage - (ICD10 L97.512 - Non-pressure chronic ulcer of other part of right foot with fat layer exposed) oooo LOINC Code: 6462-6 oooo Convenience Name: Wound culture routine o Bacteria identified in Tissue by Biopsy culture (MICRO) - non healing wound with increased pain and purlent drainage oooo LOINC Code: 12458-0 oooo Convenience Name: Biopsy specimen  culture Patient Medications Allergies: penicillin [name], [name]. ([id number]) Notifications Medication Indication Start End Bactrim DS 07/10/2020 DOSE 1 - oral 800 mg-160 mg tablet - 1 tablet oral taken 2 times per day for 14 days. Do not take potassium while on this medication Electronic Signature(s) Signed: 07/10/2020 4:58:27 PM By: Worthy Keeler PA-C Entered By: Worthy Keeler on 07/10/2020 16:58:26 [name], [name] ([id number]) -------------------------------------------------------------------------------- Problem List Details Patient Name: [name] Date of Service: 07/10/2020 3:45 PM Medical Record Number: [id number] Patient Account Number: [id number] Date of Birth/Sex: 1929/04/12 (87 y.o. F) Treating RN: Carlene Coria Primary Care Provider: Ria Bush Other Clinician: Jeanine Luz Referring Provider: Ria Bush Treating Provider/Extender: Skipper Cliche in Treatment: 38 Active Problems ICD-10 Encounter Code Description Active Date MDM Diagnosis I73.89 Other specified peripheral vascular diseases 10/12/2019 No Yes L97.512 Non-pressure chronic ulcer of other part of right foot with fat layer 10/12/2019 No Yes exposed S41.102A Unspecified open wound of left upper arm, initial encounter 01/28/2020 No Yes Z79.01 Long term (current) use of anticoagulants 10/12/2019 No Yes I48.0 Paroxysmal atrial fibrillation 10/12/2019 No Yes B35.3 Tinea pedis 12/01/2019 No Yes Inactive Problems Resolved Problems Electronic Signature(s) Signed: 07/10/2020 4:09:27 PM By: Worthy Keeler PA-C Entered By: Worthy Keeler on 07/10/2020 16:09:27 [name] ([id number]) -------------------------------------------------------------------------------- Progress Note Details Patient Name: [name]. Date of Service: 07/10/2020 3:45 PM Medical Record Number: [id number] Patient Account Number: [id number] Date of Birth/Sex: 07/20/1929 (87 y.o. F) Treating RN: Carlene Coria Primary Care Provider: Ria Bush Other Clinician: Jeanine Luz Referring Provider: Ria Bush Treating Provider/Extender: Skipper Cliche in Treatment: 38 Subjective Chief Complaint Information obtained from Patient Right Lateral Foot Ulcer History of Present Illness (HPI) 10/12/2019 upon evaluation today patient appears to be doing somewhat poorly in regard to her wound at this point. She has a ulcer over the right fifth metatarsal region. This has been managed by podiatry up to this point. Unfortunately the patient just does not seem to be making the progress that they were hoping for an subsequently the patient was referred to [place] by [facility] Dr. Posey Pronto. With that being said I did note that the patient does have peripheral vascular disease which has been managed by Dr. Lazaro Arms her most recent angiogram with intervention was on 08/02/2019. The ABIs following were on the right 1.30 with a TBI of 0.61 normal left 1.31 with a TBI of 0.58. Obviously things seem to be doing somewhat better at this point hopefully enough to allow her to heal. She is on anticoagulant therapy due to atrial fibrillation long-term. With that being said the patient is unfortunately having quite a bit of discomfort at this time secondary to the wound. She  also has erythema which is very likely secondary to infection. I am going obtain a culture of the area today once debridement is complete. Apparently the patient also had a x-ray that was performed by Dr. Posey Pronto on 09/21/2019 and this showed that the patient had no obvious signs on x-ray of cortical irregularity and no osteomyelitis obviously noted. With that being said as I discussed with the patient's daughter as well this does not indicate that the patient absolutely has no osteomyelitis as x-rays are only at best 50% helpful in identifying this complication. Nonetheless I do believe that we need to have the patient continue to have  this further evaluated specifically I am to recommend that we check into an MRI which I think would be ideal to evaluate whether or not there is osteomyelitis present. 7/16; this is a patient with an ulcer over the right fifth metatarsal head. She follows with Dr. Lucky Cowboy at [facility]. She is due to have an MRI of the foot on 7/26. Using Santyl to the wound 11/04/2019 on evaluation today patient appears to be doing okay in regard to her wound at this time. Unfortunately since I last saw her which was actually her second visit here in the clinic she has moved into a new apartment and then subsequently had a fall. She ended up in the hospital she did fracture her left arm and subsequently has also been dealing with a lot of dizziness she has been at [employer]. She tells me that getting in the change the dressing has been somewhat difficult as far as daily dressing changes are concerned with the Santyl. Nonetheless when they do change it that is what is been used and her daughter-in-law has also been coming in to help out as well with the dressing changes to try to make sure it gets done daily. Fortunately there is no signs of active infection at this time. No fever chills noted. I do believe the Santyl has been beneficial and is likely still the best way to go at this point for the patient. I did review the patient's MRI which showed some potential for reactive osteitis but did not show any signs of osteomyelitis overtly. That cannot be excluded but again right now it does not appear that is very likely present as well. This is good news. I also did review her culture she has been on the Bactrim she tells me she has taken this and again that was appropriate based on the results of her culture. 11/19/2019 upon evaluation today patient appears to be doing decently well in regard to her wound. Fortunately there is no signs of active infection at this time wound is not significantly larger in  fact is roughly about the same at this point. With that being said there is not as much erythema as previously noted I think the infection seems to be under much better control which is great news. Unfortunately she has fallen since I last saw her and broken her left arm she has an appointment with them following the appointment with me. 8/25; patient was worked in acutely today at the request of home health and was concerned about her right heel, generalized erythema in her feet bilaterally. According to her daughter things have been getting worse over the last for 5 days but not precisely from a wound care point of view. Her wound is on the lateral part of the right fifth metatarsal head looks about the same as I remember this.  She has developed dryness and flaking of the skin on her plantar feet. As well a very tender area localized over the insertion point of the Achilles tendon. She has restless leg syndrome. Does not specifically offload the area at night. She wears socks at home and does not really give me a pressure on the right heel issue. I reviewed her vascular status. She underwent her last revascularization on 08/02/2019 by Dr. Lucky Cowboy. This included an angioplasty of the right peroneal artery as well as the right posterior tibial artery. Her last noninvasive arterial study was on 11/23/2019. On the right her ABI was 1.08 TBI of 0.48. On the left ABI at 1.21 with a TBI of 0.54. Family relates that Dr. Lucky Cowboy felt that he had done the best he could and this. We have been using Santyl to the one wound she has on the right lateral fifth metatarsal head. She does not have a new wound today which is the concern that letter coming into the clinic 12/09/2019 on evaluation today patient appears to be doing decently well in regard to her foot ulcer at this point. She does note that with the compression wrap that the home health nurse put on after contacting our clinic that she actually did much better and  felt much better. Her heel is also improved. Fortunately there is no signs of active infection at this time. No fevers, chills, nausea, vomiting, or diarrhea. 12/24/2019 upon evaluation today patient appears to be doing somewhat poorly still in regard to her foot. Unfortunately this is just not showing the signs of improvement that I would like to see it is a little bit cleaner but also appears to be erythematous and I believe there is infection noted at this point. We will get a need to address this in my opinion. I did actually recommend that we switch back to the Iodoflex as well apparently the home health nurse had switch this away that not really what we want to do at this point in my opinion. They were using silver alginate I think most recently. 01/06/2020 upon evaluation today patient actually appears to be doing much better in regard to her wound. We have switched back to the silver alginate after her home health nurse contacted me and honestly I feel like she is doing quite well in that regard. She does have a lot of drainage and therefore I think that is probably a better option for. With that being said she is tolerating the compression wrap without complication that [name], [name]. ([id number]) also seems to be helping. 01/28/2020 on evaluation today patient appears to be doing well with regard to her foot ulcer. I do feel like she is showing signs of improvement which is great news. There is no signs of active infection at this time which is also good news. No fevers, chills, nausea, vomiting, or diarrhea. Unfortunately she did have a fall and sustained an injury to the left upper arm. She has a skin tear here this appears to be doing okay although the dressing was stuck to this this was an alginate dressing. I really think she probably needs something like Xeroform to try to prevent anything from sticking to this region. Other than that she seems to be doing quite well. 02/11/2020 on  evaluation today patient's arm skin tear appears to be completely healed which is great. Unfortunately she is still having issues at this time with her foot although this seems to be showing signs of improvement it  still very slow. There is no signs of active infection at this time. 03/06/2020 upon evaluation today patient appears to be doing decently well in regard to her foot ulcer. There is some minimal slough noted on the surface of the wound building up at this point. Otherwise I really feel like she is doing quite nicely with quite a bit of new granulation and epithelial growth. 03/23/2020 upon evaluation today patient appears to be doing well with regards to her wound. She has been tolerating the dressing changes without complication. Fortunately there is no evidence of active infection and overall feel like her foot ulcer is doing excellent at this point. 04/13/2020 on evaluation today patient appears to be doing well with regard to her foot ulcer. She has been tolerating the dressing changes without complication. Fortunately there is no signs of active infection at this time. Overall I feel like the wound is getting close to complete closure and this appears to be doing quite well in my opinion. 04/27/2020 on evaluation today patient appears to be doing excellent in regard to her wound. She has been tolerating the dressing changes without complication. Fortunately there is no signs of active infection and overall very pleased with where things stand today. In fact I feel like the wound is very close to complete resolution. 05/18/2020 upon evaluation today patient appears to be doing well with regard to her foot ulcer. There is no evidence of active infection at this time which is great news and overall very pleased with where things stand. No fevers, chills, nausea, vomiting, or diarrhea. I do feel like she is making great progress and I feel like that the wound is very tiny but again this last  portion getting close has been somewhat difficult. 07/05/2020 upon evaluation today patient appears to be doing a little poorly in regard to her foot ulcer. Fortunately there is no signs of active infection at this time systemically though locally I feel it may be some cellulitis it looks like this wound may have prematurely sealed up subsequently causing a blister that had to be removed. The good news is that I do not think it is a very bad infection but I believe she may be experiencing cellulitis. 06/16/2020 upon evaluation today patient appears to be doing well with regard to her wound. It seems like it showed signs of improvement compared to last week where she had more infection noted. With that being said I do believe that based on what I am seeing the patient seems to be making excellent progress although I think still this is a small area that is going require some debridement to clear away some of the callus and drainage which is trapping fluid behind it based on what I see. 06/23/2020 upon evaluation today patient appears to be doing well with regard to her leg ulcer. She has been tolerating the dressing changes without application. And currently I do believe the alginate is doing a good job keeping it dry it still tends to cover over and we do not want her trapping any fluid therefore I do remove that outer callus/drainage covering each time she comes in. Overall I think this is doing a great job. 06/30/2020 patient appears to be doing okay in regard to pain in regard to her wound. Unfortunately she continues to develop a callus buildup over the area. This is unfortunate as to be honest it is continuing to cause problems with her basically being able to heal as she needs to  really granulate in from the bottom out on that is not happening. I think we need to find something that works better to keep this area open. 07/10/2020 upon evaluation today patient unfortunately appears to be having  increased pain yet again and is not doing nearly as well as she was previous. I do believe this is indication of infection. The patient is going to require a culture today it appears. Objective Constitutional Well-nourished and well-hydrated in no acute distress. Vitals Time Taken: 3:58 PM, Height: 62 in, Weight: 140 lbs, BMI: 25.6, Temperature: 98.1 F, Pulse: 58 bpm, Respiratory Rate: 18 breaths/min, Blood Pressure: 183/92 mmHg. Respiratory normal breathing without difficulty. Psychiatric this patient is able to make decisions and demonstrates good insight into disease process. Alert and Oriented x 3. pleasant and cooperative. General Notes: Upon inspection as I was further evaluating for any signs of infection the patient did require debridement and subsequently after removing the callus I did actually find that there was also a portion of bone in the base of the wound that also removed fairly easily and appeared to be necrotic that I removed and sent for pathology as a sample. I think that skin to be necessary in order for [place] to determine if indeed this is [name], [name]. ([id number]) osteomyelitis previously MRI stated no but again it appears that something may be going on here based on the physical exam findings especially on debridement. Also obtained a wound culture today. Integumentary (Hair, Skin) Wound #3 status is Open. Original cause of wound was Gradually Appeared. The date acquired was: 04/09/2019. The wound has been in treatment 38 weeks. The wound is located on the Right Metatarsal head fifth. The wound measures 0.2cm length x 0.2cm width x 0.4cm depth; 0.031cm^2 area and 0.013cm^3 volume. There is Fat Layer (Subcutaneous Tissue) exposed. There is a medium amount of purulent drainage noted. The wound margin is distinct with the outline attached to the wound base. There is small (1-33%) pink granulation within the wound bed. There is a large (67-100%) amount of necrotic tissue  within the wound bed including Adherent Slough. Assessment Active Problems ICD-10 Other specified peripheral vascular diseases Non-pressure chronic ulcer of other part of right foot with fat layer exposed Unspecified open wound of left upper arm, initial encounter Long term (current) use of anticoagulants Paroxysmal atrial fibrillation Tinea pedis Procedures Wound #3 Pre-procedure diagnosis of Wound #3 is an Arterial Insufficiency Ulcer located on the Right Metatarsal head fifth .Severity of Tissue Pre Debridement is: Fat layer exposed. There was a Excisional Skin/Subcutaneous Tissue/Muscle/Bone Debridement with a total area of 0.04 sq cm performed by Tommie Sams., PA-C. With the following instrument(s): Curette to remove Viable and Non-Viable tissue/material. Material removed includes Bone,Subcutaneous Tissue, Slough, Skin: Dermis, and Skin: Epidermis after achieving pain control using Lidocaine 4% Topical Solution. 2 specimens were taken by a Swab and Tissue Culture and sent to the lab per facility protocol. A time out was conducted at 16:11, prior to the start of the procedure. A Minimum amount of bleeding was controlled with Pressure. The procedure was tolerated well with a pain level of 0 throughout and a pain level of 0 following the procedure. Post Debridement Measurements: 0.2cm length x 0.2cm width x 0.4cm depth; 0.013cm^3 volume. Character of Wound/Ulcer Post Debridement is improved. Severity of Tissue Post Debridement is: Fat layer exposed. Post procedure Diagnosis Wound #3: Same as Pre-Procedure Plan Follow-up Appointments: Return Appointment in 1 week. Home Health: [facility]: - [facility] for wound care. May utilize formulary equivalent dressing for wound treatment orders unless otherwise specified. Home Health Nurse may visit PRN to address patient s wound care needs. Scheduled days for dressing changes to be completed; exception, patient has  scheduled wound care visit that day. **Please direct any NON-WOUND related issues/requests for orders to patient's Primary Care Physician. **If current dressing causes regression in wound condition, may D/C ordered dressing product/s and apply Normal Saline Moist Dressing daily until next [place] or Other MD appointment. **Notify [facility] of regression in wound condition at [phone]. Laboratory ordered were: Wound culture routine - non healing wound with increased pain and purlent drainage, Biopsy specimen culture - non healing wound with increased pain and purlent drainage The following medication(s) was prescribed: Bactrim DS oral 800 mg-160 mg tablet 1 1 tablet oral taken 2 times per day for 14 days. Do not take potassium while on this medication starting 07/10/2020 WOUND #3: - Metatarsal head fifth Wound Laterality: Right Cleanser: Normal Saline (Generic) 3 x Per Week/30 Days Discharge Instructions: Wash your hands with soap and water. Remove old dressing, discard into plastic bag and place into trash. Cleanse the wound with Normal Saline prior to applying a clean dressing using gauze sponges, not tissues or cotton balls. Do not scrub or use excessive force. Pat dry using gauze sponges, not tissue or cotton balls. Secondary Dressing: Mepilex Border Flex, 4x4 (in/in) (Generic) 3 x Per Week/30 Days Discharge Instructions: Apply to wound as directed. Do not cut. Secondary Dressing: Silvercel 4 1/4x 4 1/4 (in/in) (Generic) 3 x Per Week/30 Days Discharge Instructions: Apply Silvercel 4 1/4x 4 1/4 (in/in) as instructed [name], Crystal S. ([id number]) 1. Would recommend currently that we go ahead and initiate treatment with a silver alginate dressing I think this can be the best way to go, do better than the Hydrofera Blue rope at this point. 2. I am also can recommend that we have the patient continue with the cleaning of the wound 3 times a week and then subsequently  applying the silver alginate to follow. 3. I am also can recommend that we see what the culture shows I did perform a wound culture as well as pathology today and depending on the results of the culture and pathology we will make adjustments in the treatment regimen. For the time being I did send in a prescription for Bactrim for the patient she is done well with this in the past. We will see patient back for reevaluation in 1 week here in the clinic. If anything worsens or changes patient will contact our office for additional recommendations. Electronic Signature(s) Signed: 07/10/2020 4:58:40 PM By: Worthy Keeler PA-C Entered By: Worthy Keeler on 07/10/2020 16:58:40 [name] ([id number]) -------------------------------------------------------------------------------- SuperBill Details Patient Name: [name] Date of Service: 07/10/2020 Medical Record Number: [id number] Patient Account Number: [id number] Date of Birth/Sex: 10-17-1929 (87 y.o. F) Treating RN: Carlene Coria Primary Care Provider: Ria Bush Other Clinician: Jeanine Luz Referring Provider: Ria Bush Treating Provider/Extender: Skipper Cliche in Treatment: 38 Diagnosis Coding ICD-10 Codes Code Description I73.89 Other specified peripheral vascular diseases L97.512 Non-pressure chronic ulcer of other part of right foot with fat layer exposed S41.102A Unspecified open wound of left upper arm, initial encounter Z79.01 Long term (current) use of anticoagulants I48.0 Paroxysmal atrial fibrillation B35.3 Tinea pedis Facility Procedures CPT4 Code: 93734287 Description: 68115 - DEB BONE 20 SQ CM/< Modifier: Quantity: 1 CPT4 Code: Description: ICD-10 Diagnosis Description L97.512 Non-pressure  chronic ulcer of other part of right foot with fat layer Modifier: exposed Quantity: Physician Procedures CPT4: Description Modifier Quantity Code [id number] 48628 - WC PHYS LEVEL 4 - EST PT 25  1 CPT4: ICD-10 Diagnosis Description I73.89 Other specified peripheral vascular diseases L97.512 Non-pressure chronic ulcer of other part of right foot with fat layer exposed S41.102A Unspecified open wound of left upper arm, initial encounter Z79.01 Long  term (current) use of anticoagulants CPT4: 2417530 Debridement; bone (includes epidermis, dermis, subQ tissue, muscle and/or fascia, if performed) 1st 20 1 sqcm or less CPT4: ICD-10 Diagnosis Description L97.512 Non-pressure chronic ulcer of other part of right foot with fat layer exposed Electronic Signature(s) Signed: 07/10/2020 4:58:59 PM By: Worthy Keeler PA-C Entered By: Worthy Keeler on 07/10/2020 16:58:59

## 2020-07-10 NOTE — Progress Notes (Addendum)
Diagnosis: Iron Deficiency Anemia  Provider:  Marshell Garfinkel, MD  Procedure: Infusion  IV Type: Peripheral, IV Location: L Antecubital  Feraheme (Ferumoxytol), Dose: 510 mg  Infusion Start Time: 06/29/20 0915  Infusion Stop Time: 06/29/20 2060  Post Infusion IV Care: Observation period completed and Peripheral IV Discontinued  Discharge: Condition: Good, Destination: Home . AVS provided to patient.   Performed by:  Jonelle Sidle, RN

## 2020-07-11 ENCOUNTER — Ambulatory Visit: Payer: Medicare Other | Admitting: Podiatry

## 2020-07-11 ENCOUNTER — Encounter: Payer: Self-pay | Admitting: Podiatry

## 2020-07-11 DIAGNOSIS — S62102D Fracture of unspecified carpal bone, left wrist, subsequent encounter for fracture with routine healing: Secondary | ICD-10-CM | POA: Diagnosis not present

## 2020-07-11 DIAGNOSIS — I739 Peripheral vascular disease, unspecified: Secondary | ICD-10-CM

## 2020-07-11 DIAGNOSIS — I4891 Unspecified atrial fibrillation: Secondary | ICD-10-CM | POA: Diagnosis not present

## 2020-07-11 DIAGNOSIS — L97513 Non-pressure chronic ulcer of other part of right foot with necrosis of muscle: Secondary | ICD-10-CM | POA: Diagnosis not present

## 2020-07-11 DIAGNOSIS — M810 Age-related osteoporosis without current pathological fracture: Secondary | ICD-10-CM | POA: Diagnosis not present

## 2020-07-11 DIAGNOSIS — G9009 Other idiopathic peripheral autonomic neuropathy: Secondary | ICD-10-CM | POA: Diagnosis not present

## 2020-07-11 DIAGNOSIS — E7849 Other hyperlipidemia: Secondary | ICD-10-CM | POA: Diagnosis not present

## 2020-07-11 DIAGNOSIS — M79675 Pain in left toe(s): Secondary | ICD-10-CM

## 2020-07-11 DIAGNOSIS — I872 Venous insufficiency (chronic) (peripheral): Secondary | ICD-10-CM | POA: Diagnosis not present

## 2020-07-11 DIAGNOSIS — I272 Pulmonary hypertension, unspecified: Secondary | ICD-10-CM | POA: Diagnosis not present

## 2020-07-11 DIAGNOSIS — E538 Deficiency of other specified B group vitamins: Secondary | ICD-10-CM | POA: Diagnosis not present

## 2020-07-11 DIAGNOSIS — M79674 Pain in right toe(s): Secondary | ICD-10-CM | POA: Diagnosis not present

## 2020-07-11 DIAGNOSIS — I5032 Chronic diastolic (congestive) heart failure: Secondary | ICD-10-CM | POA: Diagnosis not present

## 2020-07-11 DIAGNOSIS — K219 Gastro-esophageal reflux disease without esophagitis: Secondary | ICD-10-CM | POA: Diagnosis not present

## 2020-07-11 DIAGNOSIS — I130 Hypertensive heart and chronic kidney disease with heart failure and stage 1 through stage 4 chronic kidney disease, or unspecified chronic kidney disease: Secondary | ICD-10-CM | POA: Diagnosis not present

## 2020-07-11 DIAGNOSIS — B351 Tinea unguium: Secondary | ICD-10-CM

## 2020-07-11 DIAGNOSIS — M1990 Unspecified osteoarthritis, unspecified site: Secondary | ICD-10-CM | POA: Diagnosis not present

## 2020-07-11 DIAGNOSIS — Z9181 History of falling: Secondary | ICD-10-CM | POA: Diagnosis not present

## 2020-07-11 DIAGNOSIS — E559 Vitamin D deficiency, unspecified: Secondary | ICD-10-CM | POA: Diagnosis not present

## 2020-07-11 DIAGNOSIS — Z7901 Long term (current) use of anticoagulants: Secondary | ICD-10-CM | POA: Diagnosis not present

## 2020-07-11 DIAGNOSIS — Z792 Long term (current) use of antibiotics: Secondary | ICD-10-CM | POA: Diagnosis not present

## 2020-07-11 DIAGNOSIS — M5136 Other intervertebral disc degeneration, lumbar region: Secondary | ICD-10-CM | POA: Diagnosis not present

## 2020-07-11 DIAGNOSIS — I2510 Atherosclerotic heart disease of native coronary artery without angina pectoris: Secondary | ICD-10-CM | POA: Diagnosis not present

## 2020-07-11 DIAGNOSIS — G2581 Restless legs syndrome: Secondary | ICD-10-CM | POA: Diagnosis not present

## 2020-07-11 DIAGNOSIS — N1831 Chronic kidney disease, stage 3a: Secondary | ICD-10-CM | POA: Diagnosis not present

## 2020-07-11 DIAGNOSIS — Z7982 Long term (current) use of aspirin: Secondary | ICD-10-CM | POA: Diagnosis not present

## 2020-07-11 NOTE — Progress Notes (Signed)
  Subjective:  Patient ID: [name], female    DOB: 24-Dec-1929,  MRN: [id number]  Chief Complaint  Patient presents with  . Nail Problem    Nail, callous trim   RFC   87 y.o. female returns for the above complaint.  Patient presents with thickened elongated dystrophic toenails x10.  Patient states that they are painful to touch.  She is not able to take care of her self.  Patient has a history of peripheral vascular disease with micro vessel disease as well.  She would like to discuss what would happen in case if she had to undergo amputation or bone resection through surgery in the foot.  I discussed this with the patient in extensive detail.  Objective:  There were no vitals filed for this visit. Podiatric Exam: Vascular: dorsalis pedis and posterior tibial pulses are non palpable bilateral. Capillary return is sluggish temperature gradient is WNL. Skin turgor WNL.  Dependent rubor noted to bilateral lower extremity Sensorium: Normal Semmes Weinstein monofilament test. Normal tactile sensation bilaterally. Nail Exam: Pt has thick disfigured discolored nails with subungual debris noted bilateral entire nail hallux through fifth toenails.  Pain on palpation to the nails. Ulcer Exam: There is no evidence of ulcer or pre-ulcerative changes or infection. Orthopedic Exam: Muscle tone and strength are WNL. No limitations in general ROM. No crepitus or effusions noted. HAV  B/L.  Hammer toes 2-5  B/L. Skin: No Porokeratosis. No infection or ulcers    Assessment & Plan:   1. Small vessel disease ([place])   2. Pain due to onychomycosis of toenails of both feet   3. Peripheral vascular disease ([place])     Patient was evaluated and treated and all questions answered.  Right lateral submetatarsal 5 ulceration -This wound is primarily managed by the wound care center.  I will defer further recommendation to them.  Microvascular disease -I discussed with the patient the etiology of micro  vessel disease adverse treatment options were discussed.  Patient does have peripheral vascular disease however given that the above ulceration is not healing with exposure of bone from the wound care center.  I believe that I wanted to discuss with him just in case if this ended up being osteomyelitis as she is still pending bone biopsy.  I wanted to discuss about her surgical plan as well.  I have asked him to keep me updated if it does come back as osteomyelitis I am happy to undergo bone resection/amputation as needed.  They state understanding  Onychomycosis with pain  -Nails palliatively debrided as below. -Educated on self-care  Procedure: Nail Debridement Rationale: pain  Type of Debridement: manual, sharp debridement. Instrumentation: Nail nipper, rotary burr. Number of Nails: 10  Procedures and Treatment: Consent by patient was obtained for treatment procedures. The patient understood the discussion of treatment and procedures well. All questions were answered thoroughly reviewed. Debridement of mycotic and hypertrophic toenails, 1 through 5 bilateral and clearing of subungual debris. No ulceration, no infection noted.  Return Visit-Office Procedure: Patient instructed to return to the office for a follow up visit 3 months for continued evaluation and treatment.  Boneta Lucks, DPM    No follow-ups on file.

## 2020-07-12 ENCOUNTER — Other Ambulatory Visit (INDEPENDENT_AMBULATORY_CARE_PROVIDER_SITE_OTHER): Payer: Medicare Other

## 2020-07-12 DIAGNOSIS — M810 Age-related osteoporosis without current pathological fracture: Secondary | ICD-10-CM

## 2020-07-12 LAB — BASIC METABOLIC PANEL
BUN: 19 mg/dL (ref 6–23)
CO2: 28 mEq/L (ref 19–32)
Calcium: 8.9 mg/dL (ref 8.4–10.5)
Chloride: 105 mEq/L (ref 96–112)
Creatinine, Ser: 0.98 mg/dL (ref 0.40–1.20)
GFR: 51.32 mL/min — ABNORMAL LOW (ref >60.00)
Glucose, Bld: 85 mg/dL (ref 70–99)
Potassium: 4.4 mEq/L (ref 3.5–5.1)
Sodium: 140 mEq/L (ref 135–145)

## 2020-07-13 ENCOUNTER — Other Ambulatory Visit: Payer: Medicare Other

## 2020-07-13 DIAGNOSIS — I4891 Unspecified atrial fibrillation: Secondary | ICD-10-CM | POA: Diagnosis not present

## 2020-07-13 DIAGNOSIS — M5136 Other intervertebral disc degeneration, lumbar region: Secondary | ICD-10-CM | POA: Diagnosis not present

## 2020-07-13 DIAGNOSIS — K219 Gastro-esophageal reflux disease without esophagitis: Secondary | ICD-10-CM | POA: Diagnosis not present

## 2020-07-13 DIAGNOSIS — S62102D Fracture of unspecified carpal bone, left wrist, subsequent encounter for fracture with routine healing: Secondary | ICD-10-CM | POA: Diagnosis not present

## 2020-07-13 DIAGNOSIS — M1990 Unspecified osteoarthritis, unspecified site: Secondary | ICD-10-CM | POA: Diagnosis not present

## 2020-07-13 DIAGNOSIS — E559 Vitamin D deficiency, unspecified: Secondary | ICD-10-CM | POA: Diagnosis not present

## 2020-07-13 DIAGNOSIS — Z9181 History of falling: Secondary | ICD-10-CM | POA: Diagnosis not present

## 2020-07-13 DIAGNOSIS — I5032 Chronic diastolic (congestive) heart failure: Secondary | ICD-10-CM | POA: Diagnosis not present

## 2020-07-13 DIAGNOSIS — I2510 Atherosclerotic heart disease of native coronary artery without angina pectoris: Secondary | ICD-10-CM | POA: Diagnosis not present

## 2020-07-13 DIAGNOSIS — E538 Deficiency of other specified B group vitamins: Secondary | ICD-10-CM | POA: Diagnosis not present

## 2020-07-13 DIAGNOSIS — G2581 Restless legs syndrome: Secondary | ICD-10-CM | POA: Diagnosis not present

## 2020-07-13 DIAGNOSIS — I272 Pulmonary hypertension, unspecified: Secondary | ICD-10-CM | POA: Diagnosis not present

## 2020-07-13 DIAGNOSIS — I130 Hypertensive heart and chronic kidney disease with heart failure and stage 1 through stage 4 chronic kidney disease, or unspecified chronic kidney disease: Secondary | ICD-10-CM | POA: Diagnosis not present

## 2020-07-13 DIAGNOSIS — M810 Age-related osteoporosis without current pathological fracture: Secondary | ICD-10-CM | POA: Diagnosis not present

## 2020-07-13 DIAGNOSIS — L97513 Non-pressure chronic ulcer of other part of right foot with necrosis of muscle: Secondary | ICD-10-CM | POA: Diagnosis not present

## 2020-07-13 DIAGNOSIS — G9009 Other idiopathic peripheral autonomic neuropathy: Secondary | ICD-10-CM | POA: Diagnosis not present

## 2020-07-13 DIAGNOSIS — Z7901 Long term (current) use of anticoagulants: Secondary | ICD-10-CM | POA: Diagnosis not present

## 2020-07-13 DIAGNOSIS — Z7982 Long term (current) use of aspirin: Secondary | ICD-10-CM | POA: Diagnosis not present

## 2020-07-13 DIAGNOSIS — Z792 Long term (current) use of antibiotics: Secondary | ICD-10-CM | POA: Diagnosis not present

## 2020-07-13 DIAGNOSIS — I872 Venous insufficiency (chronic) (peripheral): Secondary | ICD-10-CM | POA: Diagnosis not present

## 2020-07-13 DIAGNOSIS — E7849 Other hyperlipidemia: Secondary | ICD-10-CM | POA: Diagnosis not present

## 2020-07-13 DIAGNOSIS — N1831 Chronic kidney disease, stage 3a: Secondary | ICD-10-CM | POA: Diagnosis not present

## 2020-07-13 LAB — AEROBIC CULTURE W GRAM STAIN (SUPERFICIAL SPECIMEN)

## 2020-07-14 NOTE — Progress Notes (Signed)
ARISBETH, PURRINGTON ([id number]) Visit Report for 07/10/2020 Arrival Information Details Patient Name: [name], [name]. Date of Service: 07/10/2020 3:45 PM Medical Record Number: [id number] Patient Account Number: [id number] Date of Birth/Sex: 04-25-1929 (87 y.o. F) Treating RN: Donnamarie Poag Primary Care Teresa Lemmerman: Ria Bush Other Clinician: Jeanine Luz Referring Menno Vanbergen: Ria Bush Treating Micholas Drumwright/Extender: Skipper Cliche in Treatment: 38 Visit Information History Since Last Visit Added or deleted any medications: No Patient Arrived: [name] Any new allergies or adverse reactions: No Arrival Time: 15:58 Had a fall or experienced change in No Accompanied By: daughter activities of daily living that may affect Transfer Assistance: None risk of falls: Patient Identification Verified: Yes Hospitalized since last visit: No Secondary Verification Process Completed: Yes Pain Present Now: Yes Patient Requires Transmission-Based No Precautions: Patient Has Alerts: Yes Patient Alerts: 08/31/19 ABI R)1.3 L) 1.31 08/31/19 TBI R).61 L).58 Electronic Signature(s) Signed: 07/11/2020 10:57:38 AM By: Donnamarie Poag Entered By: Donnamarie Poag on 07/10/2020 15:58:58 [name] ([id number]) -------------------------------------------------------------------------------- Clinic Level of Care Assessment Details Patient Name: [name]. Date of Service: 07/10/2020 3:45 PM Medical Record Number: [id number] Patient Account Number: [id number] Date of Birth/Sex: 1929/09/15 (87 y.o. F) Treating RN: Carlene Coria Primary Care Simone Tuckey: Ria Bush Other Clinician: Jeanine Luz Referring Kaydi Kley: Ria Bush Treating Kailen Name/Extender: Skipper Cliche in Treatment: 38 Clinic Level of Care Assessment Items TOOL 1 Quantity Score []  - Use when EandM and Procedure is performed on INITIAL visit 0 ASSESSMENTS - Nursing Assessment / Reassessment []  - General Physical  Exam (combine w/ comprehensive assessment (listed just below) when performed on new 0 pt. evals) []  - 0 Comprehensive Assessment (HX, ROS, Risk Assessments, Wounds Hx, etc.) ASSESSMENTS - Wound and Skin Assessment / Reassessment []  - Dermatologic / Skin Assessment (not related to wound area) 0 ASSESSMENTS - Ostomy and/or Continence Assessment and Care []  - Incontinence Assessment and Management 0 []  - 0 Ostomy Care Assessment and Management (repouching, etc.) PROCESS - Coordination of Care []  - Simple Patient / Family Education for ongoing care 0 []  - 0 Complex (extensive) Patient / Family Education for ongoing care []  - 0 Staff obtains Programmer, systems, Records, Test Results / Process Orders []  - 0 Staff telephones HHA, Nursing Homes / Clarify orders / etc []  - 0 Routine Transfer to another Facility (non-emergent condition) []  - 0 Routine Hospital Admission (non-emergent condition) []  - 0 New Admissions / Biomedical engineer / Ordering NPWT, Apligraf, etc. []  - 0 Emergency Hospital Admission (emergent condition) PROCESS - Special Needs []  - Pediatric / Minor Patient Management 0 []  - 0 Isolation Patient Management []  - 0 Hearing / Language / Visual special needs []  - 0 Assessment of Community assistance (transportation, D/C planning, etc.) []  - 0 Additional assistance / Altered mentation []  - 0 Support Surface(s) Assessment (bed, cushion, seat, etc.) INTERVENTIONS - Miscellaneous []  - External ear exam 0 []  - 0 Patient Transfer (multiple staff / Civil Service fast streamer / Similar devices) []  - 0 Simple Staple / Suture removal (25 or less) []  - 0 Complex Staple / Suture removal (26 or more) []  - 0 Hypo/Hyperglycemic Management (do not check if billed separately) []  - 0 Ankle / Brachial Index (ABI) - do not check if billed separately Has the patient been seen at the hospital within the last three years: Yes Total Score: 0 Level Of Care: ____ [name]  ([id number]) Electronic Signature(s) Signed: 07/14/2020 8:10:37 AM By: Carlene Coria RN Entered By: Carlene Coria on 07/10/2020 16:20:01 [name] ([id number]) --------------------------------------------------------------------------------  Encounter Discharge Information Details Patient Name: [name], [name]. Date of Service: 07/10/2020 3:45 PM Medical Record Number: [id number] Patient Account Number: [id number] Date of Birth/Sex: 14-Nov-1929 (87 y.o. F) Treating RN: Dolan Amen Primary Care Makaiah Terwilliger: Ria Bush Other Clinician: Jeanine Luz Referring Sonnie Bias: Ria Bush Treating Njeri Vicente/Extender: Skipper Cliche in Treatment: 38 Encounter Discharge Information Items Post Procedure Vitals Discharge Condition: Stable Temperature (F): 98.1 Ambulatory Status: Walker Pulse (bpm): 58 Discharge Destination: Home Respiratory Rate (breaths/min): 18 Transportation: Private Auto Blood Pressure (mmHg): 183/92 Accompanied By: daughter Schedule Follow-up Appointment: Yes Clinical Summary of Care: Electronic Signature(s) Signed: 07/10/2020 4:55:51 PM By: Georges Mouse, Minus Breeding RN Entered By: Georges Mouse, Minus Breeding on 07/10/2020 16:24:42 [name] ([id number]) -------------------------------------------------------------------------------- Lower Extremity Assessment Details Patient Name: [name]. Date of Service: 07/10/2020 3:45 PM Medical Record Number: [id number] Patient Account Number: [id number] Date of Birth/Sex: 06/27/1929 (87 y.o. F) Treating RN: Donnamarie Poag Primary Care Srijan Givan: Ria Bush Other Clinician: Jeanine Luz Referring Stewart Sasaki: Ria Bush Treating Lise Pincus/Extender: Jeri Cos Weeks in Treatment: 38 Edema Assessment Assessed: [Left: No] [Right: Yes] Edema: [Left: N] [Right: o] Vascular Assessment Pulses: Dorsalis Pedis Palpable: [Right:Yes] Electronic Signature(s) Signed: 07/11/2020 10:57:38 AM By: Donnamarie Poag Entered By: Donnamarie Poag on 07/10/2020 16:07:57 [name] ([id number]) -------------------------------------------------------------------------------- Multi Wound Chart Details Patient Name: [name]. Date of Service: 07/10/2020 3:45 PM Medical Record Number: [id number] Patient Account Number: [id number] Date of Birth/Sex: 27-Jun-1929 (87 y.o. F) Treating RN: Carlene Coria Primary Care Kilian Schwartz: Ria Bush Other Clinician: Jeanine Luz Referring Shakeya Kerkman: Ria Bush Treating Emalina Dubreuil/Extender: Skipper Cliche in Treatment: 38 Vital Signs Height(in): 62 Pulse(bpm): 37 Weight(lbs): 140 Blood Pressure(mmHg): 183/92 Body Mass Index(BMI): 26 Temperature(F): 98.1 Respiratory Rate(breaths/min): 18 Photos: [N/A:N/A] Wound Location: Right Metatarsal head fifth N/A N/A Wounding Event: Gradually Appeared N/A N/A Primary Etiology: Arterial Insufficiency Ulcer N/A N/A Comorbid History: Arrhythmia, Osteoarthritis N/A N/A Date Acquired: 04/09/2019 N/A N/A Weeks of Treatment: 38 N/A N/A Wound Status: Open N/A N/A Measurements L x W x D (cm) 0.2x0.2x0.4 N/A N/A Area (cm) : 0.031 N/A N/A Volume (cm) : 0.013 N/A N/A % Reduction in Area: 97.30% N/A N/A % Reduction in Volume: 88.50% N/A N/A Classification: Full Thickness Without Exposed N/A N/A Support Structures Exudate Amount: Medium N/A N/A Exudate Type: Purulent N/A N/A Exudate Color: yellow, brown, green N/A N/A Wound Margin: Distinct, outline attached N/A N/A Granulation Amount: Small (1-33%) N/A N/A Granulation Quality: Pink N/A N/A Necrotic Amount: Large (67-100%) N/A N/A Exposed Structures: Fat Layer (Subcutaneous Tissue): N/A N/A Yes Fascia: No Tendon: No Muscle: No Joint: No Bone: No Epithelialization: Small (1-33%) N/A N/A Treatment Notes Electronic Signature(s) Signed: 07/14/2020 8:10:37 AM By: Carlene Coria RN Entered By: Carlene Coria on 07/10/2020 16:11:52 [name]  ([id number]) -------------------------------------------------------------------------------- [place] Details Patient Name: [name]. Date of Service: 07/10/2020 3:45 PM Medical Record Number: [id number] Patient Account Number: [id number] Date of Birth/Sex: [birth year] (87 y.o. F) Treating RN: Carlene Coria Primary Care Kymari Lollis: Ria Bush Other Clinician: Jeanine Luz Referring Mackenzie Lia: Ria Bush Treating Declan Adamson/Extender: Skipper Cliche in Treatment: 75 Active Inactive Abuse / Safety / Falls / Self Care Management Nursing Diagnoses: Potential for falls Goals: Patient/caregiver will verbalize understanding of skin care regimen Date Initiated: 10/12/2019 Target Resolution Date: 08/09/2020 Goal Status: Active Interventions: Assess fall risk on admission and as needed Notes: Electronic Signature(s) Signed: 07/14/2020 8:10:37 AM By: Carlene Coria RN Entered By: Carlene Coria on 07/10/2020 16:11:44 [name] ([id number]) -------------------------------------------------------------------------------- Pain Assessment Details Patient  Name: [name], [name]. Date of Service: 07/10/2020 3:45 PM Medical Record Number: [id number] Patient Account Number: [id number] Date of Birth/Sex: [birth year] (87 y.o. F) Treating RN: Donnamarie Poag Primary Care Shavell Nored: Ria Bush Other Clinician: Jeanine Luz Referring Donato Studley: Ria Bush Treating Deklen Popelka/Extender: Skipper Cliche in Treatment: 38 Active Problems Location of Pain Severity and Description of Pain Patient Has Paino Yes Site Locations Rate the pain. Current Pain Level: 8 Character of Pain Describe the Pain: Burning Pain Management and Medication Current Pain Management: Electronic Signature(s) Signed: 07/11/2020 10:57:38 AM By: Donnamarie Poag Entered By: Donnamarie Poag on 07/10/2020 15:59:54 [name]  ([id number]) -------------------------------------------------------------------------------- Patient/Caregiver Education Details Patient Name: [name]. Date of Service: 07/10/2020 3:45 PM Medical Record Number: [id number] Patient Account Number: [id number] Date of Birth/Gender: 06-Sep-1929 (87 y.o. F) Treating RN: Carlene Coria Primary Care Physician: Ria Bush Other Clinician: Jeanine Luz Referring Physician: Ria Bush Treating Physician/Extender: Skipper Cliche in Treatment: 69 Education Assessment Education Provided To: Patient Education Topics Provided Wound/Skin Impairment: Methods: Explain/Verbal Responses: State content correctly Electronic Signature(s) Signed: 07/14/2020 8:10:37 AM By: Carlene Coria RN Entered By: Carlene Coria on 07/10/2020 16:20:18 [name] ([id number]) -------------------------------------------------------------------------------- Wound Assessment Details Patient Name: [name]. Date of Service: 07/10/2020 3:45 PM Medical Record Number: [id number] Patient Account Number: [id number] Date of Birth/Sex: 1929/04/13 (87 y.o. F) Treating RN: Donnamarie Poag Primary Care Giovanne Nickolson: Ria Bush Other Clinician: Jeanine Luz Referring Carmelle Bamberg: Ria Bush Treating Raiana Pharris/Extender: Skipper Cliche in Treatment: 38 Wound Status Wound Number: 3 Primary Etiology: Arterial Insufficiency Ulcer Wound Location: Right Metatarsal head fifth Wound Status: Open Wounding Event: Gradually Appeared Comorbid History: Arrhythmia, Osteoarthritis Date Acquired: 04/09/2019 Weeks Of Treatment: 38 Clustered Wound: No Photos Wound Measurements Length: (cm) 0.2 Width: (cm) 0.2 Depth: (cm) 0.4 Area: (cm) 0.031 Volume: (cm) 0.013 % Reduction in Area: 97.3% % Reduction in Volume: 88.5% Epithelialization: Small (1-33%) Wound Description Classification: Full Thickness Without Exposed Support Structures Wound Margin:  Distinct, outline attached Exudate Amount: Medium Exudate Type: Purulent Exudate Color: yellow, brown, green Foul Odor After Cleansing: No Slough/Fibrino Yes Wound Bed Granulation Amount: Small (1-33%) Exposed Structure Granulation Quality: Pink Fascia Exposed: No Necrotic Amount: Large (67-100%) Fat Layer (Subcutaneous Tissue) Exposed: Yes Necrotic Quality: Adherent Slough Tendon Exposed: No Muscle Exposed: No Joint Exposed: No Bone Exposed: No Treatment Notes Wound #3 (Metatarsal head fifth) Wound Laterality: Right Cleanser Normal Saline Discharge Instruction: Wash your hands with soap and water. Remove old dressing, discard into plastic bag and place into trash. Cleanse the wound with Normal Saline prior to applying a clean dressing using gauze sponges, not tissues or cotton balls. Do not scrub or use excessive force. Pat dry using gauze sponges, not tissue or cotton balls. [name], [name] ([id number]) Peri-Wound Care Topical Primary Dressing Secondary Dressing Mepilex Border Flex, 4x4 (in/in) Discharge Instruction: Apply to wound as directed. Do not cut. Silvercel 4 1/4x 4 1/4 (in/in) Discharge Instruction: Apply Silvercel 4 1/4x 4 1/4 (in/in) as instructed Secured With Compression Wrap Compression Stockings Add-Ons Electronic Signature(s) Signed: 07/11/2020 10:57:38 AM By: Donnamarie Poag Entered By: Donnamarie Poag on 07/10/2020 16:07:29 [name] ([id number]) -------------------------------------------------------------------------------- [place] Details Patient Name: [name]. Date of Service: 07/10/2020 3:45 PM Medical Record Number: [id number] Patient Account Number: [id number] Date of Birth/Sex: 06-20-1929 (87 y.o. F) Treating RN: Donnamarie Poag Primary Care Bobby Ragan: Ria Bush Other Clinician: Jeanine Luz Referring Mcgregor Tinnon: Ria Bush Treating Ia Leeb/Extender: Skipper Cliche in Treatment: 38 Vital Signs Time Taken:  15:58 Temperature (F): 98.1  Height (in): 62 Pulse (bpm): 58 Weight (lbs): 140 Respiratory Rate (breaths/min): 18 Body Mass Index (BMI): 25.6 Blood Pressure (mmHg): 183/92 Reference Range: 80 - 120 mg / dl Electronic Signature(s) Signed: 07/11/2020 10:57:38 AM By: Donnamarie Poag Entered ByDonnamarie Poag on 07/10/2020 15:59:22

## 2020-07-17 DIAGNOSIS — M5136 Other intervertebral disc degeneration, lumbar region: Secondary | ICD-10-CM | POA: Diagnosis not present

## 2020-07-17 DIAGNOSIS — M1990 Unspecified osteoarthritis, unspecified site: Secondary | ICD-10-CM | POA: Diagnosis not present

## 2020-07-17 DIAGNOSIS — G2581 Restless legs syndrome: Secondary | ICD-10-CM | POA: Diagnosis not present

## 2020-07-17 DIAGNOSIS — I2510 Atherosclerotic heart disease of native coronary artery without angina pectoris: Secondary | ICD-10-CM | POA: Diagnosis not present

## 2020-07-17 DIAGNOSIS — S62102D Fracture of unspecified carpal bone, left wrist, subsequent encounter for fracture with routine healing: Secondary | ICD-10-CM | POA: Diagnosis not present

## 2020-07-17 DIAGNOSIS — E538 Deficiency of other specified B group vitamins: Secondary | ICD-10-CM | POA: Diagnosis not present

## 2020-07-17 DIAGNOSIS — Z7901 Long term (current) use of anticoagulants: Secondary | ICD-10-CM | POA: Diagnosis not present

## 2020-07-17 DIAGNOSIS — I5032 Chronic diastolic (congestive) heart failure: Secondary | ICD-10-CM | POA: Diagnosis not present

## 2020-07-17 DIAGNOSIS — Z7982 Long term (current) use of aspirin: Secondary | ICD-10-CM | POA: Diagnosis not present

## 2020-07-17 DIAGNOSIS — E559 Vitamin D deficiency, unspecified: Secondary | ICD-10-CM | POA: Diagnosis not present

## 2020-07-17 DIAGNOSIS — I130 Hypertensive heart and chronic kidney disease with heart failure and stage 1 through stage 4 chronic kidney disease, or unspecified chronic kidney disease: Secondary | ICD-10-CM | POA: Diagnosis not present

## 2020-07-17 DIAGNOSIS — I272 Pulmonary hypertension, unspecified: Secondary | ICD-10-CM | POA: Diagnosis not present

## 2020-07-17 DIAGNOSIS — K219 Gastro-esophageal reflux disease without esophagitis: Secondary | ICD-10-CM | POA: Diagnosis not present

## 2020-07-17 DIAGNOSIS — L97513 Non-pressure chronic ulcer of other part of right foot with necrosis of muscle: Secondary | ICD-10-CM | POA: Diagnosis not present

## 2020-07-17 DIAGNOSIS — M810 Age-related osteoporosis without current pathological fracture: Secondary | ICD-10-CM | POA: Diagnosis not present

## 2020-07-17 DIAGNOSIS — Z9181 History of falling: Secondary | ICD-10-CM | POA: Diagnosis not present

## 2020-07-17 DIAGNOSIS — E7849 Other hyperlipidemia: Secondary | ICD-10-CM | POA: Diagnosis not present

## 2020-07-17 DIAGNOSIS — N1831 Chronic kidney disease, stage 3a: Secondary | ICD-10-CM | POA: Diagnosis not present

## 2020-07-17 DIAGNOSIS — Z792 Long term (current) use of antibiotics: Secondary | ICD-10-CM | POA: Diagnosis not present

## 2020-07-17 DIAGNOSIS — I872 Venous insufficiency (chronic) (peripheral): Secondary | ICD-10-CM | POA: Diagnosis not present

## 2020-07-17 DIAGNOSIS — G9009 Other idiopathic peripheral autonomic neuropathy: Secondary | ICD-10-CM | POA: Diagnosis not present

## 2020-07-17 DIAGNOSIS — I4891 Unspecified atrial fibrillation: Secondary | ICD-10-CM | POA: Diagnosis not present

## 2020-07-18 ENCOUNTER — Other Ambulatory Visit: Payer: Self-pay

## 2020-07-18 ENCOUNTER — Encounter: Payer: Medicare Other | Admitting: Physician Assistant

## 2020-07-18 DIAGNOSIS — L97512 Non-pressure chronic ulcer of other part of right foot with fat layer exposed: Secondary | ICD-10-CM | POA: Diagnosis not present

## 2020-07-18 DIAGNOSIS — I480 Paroxysmal atrial fibrillation: Secondary | ICD-10-CM | POA: Diagnosis not present

## 2020-07-18 DIAGNOSIS — M86671 Other chronic osteomyelitis, right ankle and foot: Secondary | ICD-10-CM | POA: Diagnosis not present

## 2020-07-18 DIAGNOSIS — I739 Peripheral vascular disease, unspecified: Secondary | ICD-10-CM | POA: Diagnosis not present

## 2020-07-18 DIAGNOSIS — B353 Tinea pedis: Secondary | ICD-10-CM | POA: Diagnosis not present

## 2020-07-18 DIAGNOSIS — Z7901 Long term (current) use of anticoagulants: Secondary | ICD-10-CM | POA: Diagnosis not present

## 2020-07-18 NOTE — Progress Notes (Addendum)
JOYANN, SPIDLE ([id number]) Visit Report for 07/18/2020 Chief Complaint Document Details Patient Name: [name], [name]. Date of Service: 07/18/2020 11:15 AM Medical Record Number: [id number] Patient Account Number: [id number] Date of Birth/Sex: 11-Dec-1929 (87 y.o. F) Treating RN: Dolan Amen Primary Care Provider: Ria Bush Other Clinician: Jeanine Luz Referring Provider: Ria Bush Treating Provider/Extender: Skipper Cliche in Treatment: 46 Information Obtained from: Patient Chief Complaint Right Lateral Foot Ulcer Electronic Signature(s) Signed: 07/18/2020 11:35:34 AM By: Worthy Keeler PA-C Entered By: Worthy Keeler on 07/18/2020 11:35:34 [name], [name] ([id number]) -------------------------------------------------------------------------------- HPI Details Patient Name: [name] Date of Service: 07/18/2020 11:15 AM Medical Record Number: [id number] Patient Account Number: [id number] Date of Birth/Sex: 01-03-1930 (87 y.o. F) Treating RN: Dolan Amen Primary Care Provider: Ria Bush Other Clinician: Jeanine Luz Referring Provider: Ria Bush Treating Provider/Extender: Skipper Cliche in Treatment: 40 History of Present Illness HPI Description: 10/12/2019 upon evaluation today patient appears to be doing somewhat poorly in regard to her wound at this point. She has a ulcer over the right fifth metatarsal region. This has been managed by podiatry up to this point. Unfortunately the patient just does not seem to be making the progress that they were hoping for an subsequently the patient was referred to [place] by [facility] Dr. Posey Pronto. With that being said I did note that the patient does have peripheral vascular disease which has been managed by Dr. Lazaro Arms her most recent angiogram with intervention was on 08/02/2019. The ABIs following were on the right 1.30 with a TBI of 0.61 normal left 1.31 with a TBI of 0.58.  Obviously things seem to be doing somewhat better at this point hopefully enough to allow her to heal. She is on anticoagulant therapy due to atrial fibrillation long-term. With that being said the patient is unfortunately having quite a bit of discomfort at this time secondary to the wound. She also has erythema which is very likely secondary to infection. I am going obtain a culture of the area today once debridement is complete. Apparently the patient also had a x-ray that was performed by Dr. Posey Pronto on 09/21/2019 and this showed that the patient had no obvious signs on x-ray of cortical irregularity and no osteomyelitis obviously noted. With that being said as I discussed with the patient's daughter as well this does not indicate that the patient absolutely has no osteomyelitis as x-rays are only at best 50% helpful in identifying this complication. Nonetheless I do believe that we need to have the patient continue to have this further evaluated specifically I am to recommend that we check into an MRI which I think would be ideal to evaluate whether or not there is osteomyelitis present. 7/16; this is a patient with an ulcer over the right fifth metatarsal head. She follows with Dr. Lucky Cowboy [facility]. She is due to have an MRI of the foot on 7/26. Using Santyl to the wound 11/04/2019 on evaluation today patient appears to be doing okay in regard to her wound at this time. Unfortunately since I last saw her which was actually her second visit here in the clinic she has moved into a new apartment and then subsequently had a fall. She ended up in the hospital she did fracture her left arm and subsequently has also been dealing with a lot of dizziness she has been at [employer]. She tells me that getting in the change the dressing has been somewhat difficult as  far as daily dressing changes are concerned with the Santyl. Nonetheless when they do change it that is what is been used and her  daughter-in-law has also been coming in to help out as well with the dressing changes to try to make sure it gets done daily. Fortunately there is no signs of active infection at this time. No fever chills noted. I do believe the Santyl has been beneficial and is likely still the best way to go at this point for the patient. I did review the patient's MRI which showed some potential for reactive osteitis but did not show any signs of osteomyelitis overtly. That cannot be excluded but again right now it does not appear that is very likely present as well. This is good news. I also did review her culture she has been on the Bactrim she tells me she has taken this and again that was appropriate based on the results of her culture. 11/19/2019 upon evaluation today patient appears to be doing decently well in regard to her wound. Fortunately there is no signs of active infection at this time wound is not significantly larger in fact is roughly about the same at this point. With that being said there is not as much erythema as previously noted I think the infection seems to be under much better control which is great news. Unfortunately she has fallen since I last saw her and broken her left arm she has an appointment with them following the appointment with me. 8/25; patient was worked in acutely today at the request of home health and was concerned about her right heel, generalized erythema in her feet bilaterally. According to her daughter things have been getting worse over the last for 5 days but not precisely from a wound care point of view. Her wound is on the lateral part of the right fifth metatarsal head looks about the same as I remember this. She has developed dryness and flaking of the skin on her plantar feet. As well a very tender area localized over the insertion point of the Achilles tendon. She has restless leg syndrome. Does not specifically offload the area at night. She wears socks at  home and does not really give me a pressure on the right heel issue. I reviewed her vascular status. She underwent her last revascularization on 08/02/2019 by Dr. Lucky Cowboy. This included an angioplasty of the right peroneal artery as well as the right posterior tibial artery. Her last noninvasive arterial study was on 11/23/2019. On the right her ABI was 1.08 TBI of 0.48. On the left ABI at 1.21 with a TBI of 0.54. Family relates that Dr. Lucky Cowboy felt that he had done the best he could and this. We have been using Santyl to the one wound she has on the right lateral fifth metatarsal head. She does not have a new wound today which is the concern that letter coming into the clinic 12/09/2019 on evaluation today patient appears to be doing decently well in regard to her foot ulcer at this point. She does note that with the compression wrap that the home health nurse put on after contacting our clinic that she actually did much better and felt much better. Her heel is also improved. Fortunately there is no signs of active infection at this time. No fevers, chills, nausea, vomiting, or diarrhea. 12/24/2019 upon evaluation today patient appears to be doing somewhat poorly still in regard to her foot. Unfortunately this is just not showing the signs  of improvement that I would like to see it is a little bit cleaner but also appears to be erythematous and I believe there is infection noted at this point. We will get a need to address this in my opinion. I did actually recommend that we switch back to the Iodoflex as well apparently the home health nurse had switch this away that not really what we want to do at this point in my opinion. They were using silver alginate I think most recently. 01/06/2020 upon evaluation today patient actually appears to be doing much better in regard to her wound. We have switched back to the silver alginate after her home health nurse contacted me and honestly I feel like she is doing quite  well in that regard. She does have a lot of drainage and therefore I think that is probably a better option for. With that being said she is tolerating the compression wrap without complication that also seems to be helping. 01/28/2020 on evaluation today patient appears to be doing well with regard to her foot ulcer. I do feel like she is showing signs of improvement which is great news. There is no signs of active infection at this time which is also good news. No fevers, chills, nausea, vomiting, or diarrhea. Unfortunately she did have a fall and sustained an injury to the left upper arm. She has a skin tear here this appears to be doing okay although [name], [name]. ([id number]) the dressing was stuck to this this was an alginate dressing. I really think she probably needs something like Xeroform to try to prevent anything from sticking to this region. Other than that she seems to be doing quite well. 02/11/2020 on evaluation today patient's arm skin tear appears to be completely healed which is great. Unfortunately she is still having issues at this time with her foot although this seems to be showing signs of improvement it still very slow. There is no signs of active infection at this time. 03/06/2020 upon evaluation today patient appears to be doing decently well in regard to her foot ulcer. There is some minimal slough noted on the surface of the wound building up at this point. Otherwise I really feel like she is doing quite nicely with quite a bit of new granulation and epithelial growth. 03/23/2020 upon evaluation today patient appears to be doing well with regards to her wound. She has been tolerating the dressing changes without complication. Fortunately there is no evidence of active infection and overall feel like her foot ulcer is doing excellent at this point. 04/13/2020 on evaluation today patient appears to be doing well with regard to her foot ulcer. She has been tolerating the  dressing changes without complication. Fortunately there is no signs of active infection at this time. Overall I feel like the wound is getting close to complete closure and this appears to be doing quite well in my opinion. 04/27/2020 on evaluation today patient appears to be doing excellent in regard to her wound. She has been tolerating the dressing changes without complication. Fortunately there is no signs of active infection and overall very pleased with where things stand today. In fact I feel like the wound is very close to complete resolution. 05/18/2020 upon evaluation today patient appears to be doing well with regard to her foot ulcer. There is no evidence of active infection at this time which is great news and overall very pleased with where things stand. No fevers, chills, nausea, vomiting, or  diarrhea. I do feel like she is making great progress and I feel like that the wound is very tiny but again this last portion getting close has been somewhat difficult. 07/05/2020 upon evaluation today patient appears to be doing a little poorly in regard to her foot ulcer. Fortunately there is no signs of active infection at this time systemically though locally I feel it may be some cellulitis it looks like this wound may have prematurely sealed up subsequently causing a blister that had to be removed. The good news is that I do not think it is a very bad infection but I believe she may be experiencing cellulitis. 06/16/2020 upon evaluation today patient appears to be doing well with regard to her wound. It seems like it showed signs of improvement compared to last week where she had more infection noted. With that being said I do believe that based on what I am seeing the patient seems to be making excellent progress although I think still this is a small area that is going require some debridement to clear away some of the callus and drainage which is trapping fluid behind it based on what I  see. 06/23/2020 upon evaluation today patient appears to be doing well with regard to her leg ulcer. She has been tolerating the dressing changes without application. And currently I do believe the alginate is doing a good job keeping it dry it still tends to cover over and we do not want her trapping any fluid therefore I do remove that outer callus/drainage covering each time she comes in. Overall I think this is doing a great job. 06/30/2020 patient appears to be doing okay in regard to pain in regard to her wound. Unfortunately she continues to develop a callus buildup over the area. This is unfortunate as to be honest it is continuing to cause problems with her basically being able to heal as she needs to really granulate in from the bottom out on that is not happening. I think we need to find something that works better to keep this area open. 07/10/2020 upon evaluation today patient unfortunately appears to be having increased pain yet again and is not doing nearly as well as she was previous. I do believe this is indication of infection. The patient is going to require a culture today it appears. 07/18/2020 upon evaluation today patient appears to be doing well with regard to her wounds. She has been tolerating the dressing changes without complication. In fact this appears to be doing significantly better compared to last week. I did review her however her pathology which showed acute osteomyelitis. Also did review the culture which showed Staph aureus. She is on Bactrim that does seem to be helping. I am unlikely going to continue that for a total of 2 months. I think they would prefer that over going to infectious disease. I am definitely okay with that as far as that is concerned. I am going to likely go ahead and refill the Bactrim next week for another 2 weeks supply in order to get the patient through for this month and then will have to do 1 more month for a full 8 weeks of treatment.  Several of the times that I sent patients to infectious disease they just kept the patient on oral antibiotic therapy as opposed to switching IV anyway and she seems to be doing very well after just 1 week. Electronic Signature(s) Signed: 07/18/2020 1:04:33 PM By: Worthy Keeler PA-C Entered  By: Worthy Keeler on 07/18/2020 13:04:33 [name] ([id number]) -------------------------------------------------------------------------------- Physical Exam Details Patient Name: [name], [name]. Date of Service: 07/18/2020 11:15 AM Medical Record Number: [id number] Patient Account Number: [id number] Date of Birth/Sex: 07/16/1929 (87 y.o. F) Treating RN: Dolan Amen Primary Care Provider: Ria Bush Other Clinician: Jeanine Luz Referring Provider: Ria Bush Treating Provider/Extender: Skipper Cliche in Treatment: 59 Constitutional Well-nourished and well-hydrated in no acute distress. Respiratory normal breathing without difficulty. Psychiatric this patient is able to make decisions and demonstrates good insight into disease process. Alert and Oriented x 3. pleasant and cooperative. Notes Upon inspection patient's wound bed actually showed signs of good granulation epithelization at this point. There does not appear to be any signs of infection which is great news and overall very pleased with where things stand today. No fevers, chills, nausea, vomiting, or diarrhea. Electronic Signature(s) Signed: 07/18/2020 1:04:47 PM By: Worthy Keeler PA-C Entered By: Worthy Keeler on 07/18/2020 13:04:47 [name] ([id number]) -------------------------------------------------------------------------------- Physician Orders Details Patient Name: [name] Date of Service: 07/18/2020 11:15 AM Medical Record Number: [id number] Patient Account Number: [id number] Date of Birth/Sex: 31-Mar-1930 (87 y.o. F) Treating RN: Dolan Amen Primary Care Provider:  Ria Bush Other Clinician: Jeanine Luz Referring Provider: Ria Bush Treating Provider/Extender: Skipper Cliche in Treatment: 54 Verbal / Phone Orders: No Diagnosis Coding ICD-10 Coding Code Description I73.89 Other specified peripheral vascular diseases L97.512 Non-pressure chronic ulcer of other part of right foot with fat layer exposed S41.102A Unspecified open wound of left upper arm, initial encounter Z79.01 Long term (current) use of anticoagulants I48.0 Paroxysmal atrial fibrillation B35.3 Tinea pedis Follow-up Appointments o Return Appointment in 1 week. [place]: - [facility] for wound care. May utilize formulary equivalent dressing for wound treatment orders unless otherwise specified. Home Health Nurse may visit PRN to address patientos wound care needs. o Scheduled days for dressing changes to be completed; exception, patient has scheduled wound care visit that day. o **Please direct any NON-WOUND related issues/requests for orders to patient's Primary Care Physician. **If current dressing causes regression in wound condition, may D/C ordered dressing product/s and apply Normal Saline Moist Dressing daily until next [place] or Other MD appointment. **Notify [facility] of regression in wound condition at [phone]. Medications-Please add to medication list. o P.O. Antibiotics - Bactrim extended x 14 days Wound Treatment Wound #3 - Metatarsal head fifth Wound Laterality: Right Cleanser: Normal Saline (Generic) 3 x Per Week/30 Days Discharge Instructions: Wash your hands with soap and water. Remove old dressing, discard into plastic bag and place into trash. Cleanse the wound with Normal Saline prior to applying a clean dressing using gauze sponges, not tissues or cotton balls. Do not scrub or use excessive force. Pat dry using gauze sponges, not tissue or cotton  balls. Primary Dressing: Hydrofera Blue Classic Foam Rope Dressing, 9x6 (mm/in) 3 x Per Week/30 Days Discharge Instructions: Cut in half, about 2 cm length-pack in wound-put in a couple drops of saline (extras sent with patient) Secondary Dressing: Mepilex Border Flex, 4x4 (in/in) (Generic) 3 x Per Week/30 Days Discharge Instructions: Apply to wound as directed. Do not cut. Electronic Signature(s) Signed: 07/18/2020 4:59:45 PM By: Georges Mouse, Minus Breeding RN Signed: 07/18/2020 6:19:37 PM By: Worthy Keeler PA-C Entered By: Georges Mouse, Minus Breeding on 07/18/2020 12:12:03 [name] ([id number]) -------------------------------------------------------------------------------- Problem List Details Patient Name: [name], [name]. Date of Service: 07/18/2020 11:15 AM Medical  Record Number: [id number] Patient Account Number: [id number] Date of Birth/Sex: 04-01-1930 (87 y.o. F) Treating RN: Dolan Amen Primary Care Provider: Ria Bush Other Clinician: Jeanine Luz Referring Provider: Ria Bush Treating Provider/Extender: Skipper Cliche in Treatment: 56 Active Problems ICD-10 Encounter Code Description Active Date MDM Diagnosis I73.89 Other specified peripheral vascular diseases 10/12/2019 No Yes L97.512 Non-pressure chronic ulcer of other part of right foot with fat layer 10/12/2019 No Yes exposed S41.102A Unspecified open wound of left upper arm, initial encounter 01/28/2020 No Yes Z79.01 Long term (current) use of anticoagulants 10/12/2019 No Yes I48.0 Paroxysmal atrial fibrillation 10/12/2019 No Yes B35.3 Tinea pedis 12/01/2019 No Yes Inactive Problems Resolved Problems Electronic Signature(s) Signed: 07/18/2020 11:35:28 AM By: Worthy Keeler PA-C Entered By: Worthy Keeler on 07/18/2020 11:35:28 [name] ([id number]) -------------------------------------------------------------------------------- Progress Note Details Patient Name: [name]. Date  of Service: 07/18/2020 11:15 AM Medical Record Number: [id number] Patient Account Number: [id number] Date of Birth/Sex: [birth year] (87 y.o. F) Treating RN: Dolan Amen Primary Care Provider: Ria Bush Other Clinician: Jeanine Luz Referring Provider: Ria Bush Treating Provider/Extender: Skipper Cliche in Treatment: 40 Subjective Chief Complaint Information obtained from Patient Right Lateral Foot Ulcer History of Present Illness (HPI) 10/12/2019 upon evaluation today patient appears to be doing somewhat poorly in regard to her wound at this point. She has a ulcer over the right fifth metatarsal region. This has been managed by podiatry up to this point. Unfortunately the patient just does not seem to be making the progress that they were hoping for an subsequently the patient was referred to [place] by [facility] Dr. Posey Pronto. With that being said I did note that the patient does have peripheral vascular disease which has been managed by Dr. Lazaro Arms her most recent angiogram with intervention was on 08/02/2019. The ABIs following were on the right 1.30 with a TBI of 0.61 normal left 1.31 with a TBI of 0.58. Obviously things seem to be doing somewhat better at this point hopefully enough to allow her to heal. She is on anticoagulant therapy due to atrial fibrillation long-term. With that being said the patient is unfortunately having quite a bit of discomfort at this time secondary to the wound. She also has erythema which is very likely secondary to infection. I am going obtain a culture of the area today once debridement is complete. Apparently the patient also had a x-ray that was performed by Dr. Posey Pronto on 09/21/2019 and this showed that the patient had no obvious signs on x-ray of cortical irregularity and no osteomyelitis obviously noted. With that being said as I discussed with the patient's daughter as well this does not indicate that the patient absolutely has no  osteomyelitis as x-rays are only at best 50% helpful in identifying this complication. Nonetheless I do believe that we need to have the patient continue to have this further evaluated specifically I am to recommend that we check into an MRI which I think would be ideal to evaluate whether or not there is osteomyelitis present. 7/16; this is a patient with an ulcer over the right fifth metatarsal head. She follows with Dr. Lucky Cowboy at [facility]. She is due to have an MRI of the foot on 7/26. Using Santyl to the wound 11/04/2019 on evaluation today patient appears to be doing okay in regard to her wound at this time. Unfortunately since I last saw her which was actually her second visit here in the clinic she has moved  into a new apartment and then subsequently had a fall. She ended up in the hospital she did fracture her left arm and subsequently has also been dealing with a lot of dizziness she has been at [employer]. She tells me that getting in the change the dressing has been somewhat difficult as far as daily dressing changes are concerned with the Santyl. Nonetheless when they do change it that is what is been used and her daughter-in-law has also been coming in to help out as well with the dressing changes to try to make sure it gets done daily. Fortunately there is no signs of active infection at this time. No fever chills noted. I do believe the Santyl has been beneficial and is likely still the best way to go at this point for the patient. I did review the patient's MRI which showed some potential for reactive osteitis but did not show any signs of osteomyelitis overtly. That cannot be excluded but again right now it does not appear that is very likely present as well. This is good news. I also did review her culture she has been on the Bactrim she tells me she has taken this and again that was appropriate based on the results of her culture. 11/19/2019 upon evaluation today  patient appears to be doing decently well in regard to her wound. Fortunately there is no signs of active infection at this time wound is not significantly larger in fact is roughly about the same at this point. With that being said there is not as much erythema as previously noted I think the infection seems to be under much better control which is great news. Unfortunately she has fallen since I last saw her and broken her left arm she has an appointment with them following the appointment with me. 8/25; patient was worked in acutely today at the request of home health and was concerned about her right heel, generalized erythema in her feet bilaterally. According to her daughter things have been getting worse over the last for 5 days but not precisely from a wound care point of view. Her wound is on the lateral part of the right fifth metatarsal head looks about the same as I remember this. She has developed dryness and flaking of the skin on her plantar feet. As well a very tender area localized over the insertion point of the Achilles tendon. She has restless leg syndrome. Does not specifically offload the area at night. She wears socks at home and does not really give me a pressure on the right heel issue. I reviewed her vascular status. She underwent her last revascularization on 08/02/2019 by Dr. Lucky Cowboy. This included an angioplasty of the right peroneal artery as well as the right posterior tibial artery. Her last noninvasive arterial study was on 11/23/2019. On the right her ABI was 1.08 TBI of 0.48. On the left ABI at 1.21 with a TBI of 0.54. Family relates that Dr. Lucky Cowboy felt that he had done the best he could and this. We have been using Santyl to the one wound she has on the right lateral fifth metatarsal head. She does not have a new wound today which is the concern that letter coming into the clinic 12/09/2019 on evaluation today patient appears to be doing decently well in regard to her foot  ulcer at this point. She does note that with the compression wrap that the home health nurse put on after contacting our clinic that she actually did  much better and felt much better. Her heel is also improved. Fortunately there is no signs of active infection at this time. No fevers, chills, nausea, vomiting, or diarrhea. 12/24/2019 upon evaluation today patient appears to be doing somewhat poorly still in regard to her foot. Unfortunately this is just not showing the signs of improvement that I would like to see it is a little bit cleaner but also appears to be erythematous and I believe there is infection noted at this point. We will get a need to address this in my opinion. I did actually recommend that we switch back to the Iodoflex as well apparently the home health nurse had switch this away that not really what we want to do at this point in my opinion. They were using silver alginate I think most recently. 01/06/2020 upon evaluation today patient actually appears to be doing much better in regard to her wound. We have switched back to the silver alginate after her home health nurse contacted me and honestly I feel like she is doing quite well in that regard. She does have a lot of drainage and therefore I think that is probably a better option for. With that being said she is tolerating the compression wrap without complication that [name], [name]. ([id number]) also seems to be helping. 01/28/2020 on evaluation today patient appears to be doing well with regard to her foot ulcer. I do feel like she is showing signs of improvement which is great news. There is no signs of active infection at this time which is also good news. No fevers, chills, nausea, vomiting, or diarrhea. Unfortunately she did have a fall and sustained an injury to the left upper arm. She has a skin tear here this appears to be doing okay although the dressing was stuck to this this was an alginate dressing. I really think  she probably needs something like Xeroform to try to prevent anything from sticking to this region. Other than that she seems to be doing quite well. 02/11/2020 on evaluation today patient's arm skin tear appears to be completely healed which is great. Unfortunately she is still having issues at this time with her foot although this seems to be showing signs of improvement it still very slow. There is no signs of active infection at this time. 03/06/2020 upon evaluation today patient appears to be doing decently well in regard to her foot ulcer. There is some minimal slough noted on the surface of the wound building up at this point. Otherwise I really feel like she is doing quite nicely with quite a bit of new granulation and epithelial growth. 03/23/2020 upon evaluation today patient appears to be doing well with regards to her wound. She has been tolerating the dressing changes without complication. Fortunately there is no evidence of active infection and overall feel like her foot ulcer is doing excellent at this point. 04/13/2020 on evaluation today patient appears to be doing well with regard to her foot ulcer. She has been tolerating the dressing changes without complication. Fortunately there is no signs of active infection at this time. Overall I feel like the wound is getting close to complete closure and this appears to be doing quite well in my opinion. 04/27/2020 on evaluation today patient appears to be doing excellent in regard to her wound. She has been tolerating the dressing changes without complication. Fortunately there is no signs of active infection and overall very pleased with where things stand today. In fact I feel  like the wound is very close to complete resolution. 05/18/2020 upon evaluation today patient appears to be doing well with regard to her foot ulcer. There is no evidence of active infection at this time which is great news and overall very pleased with where things  stand. No fevers, chills, nausea, vomiting, or diarrhea. I do feel like she is making great progress and I feel like that the wound is very tiny but again this last portion getting close has been somewhat difficult. 07/05/2020 upon evaluation today patient appears to be doing a little poorly in regard to her foot ulcer. Fortunately there is no signs of active infection at this time systemically though locally I feel it may be some cellulitis it looks like this wound may have prematurely sealed up subsequently causing a blister that had to be removed. The good news is that I do not think it is a very bad infection but I believe she may be experiencing cellulitis. 06/16/2020 upon evaluation today patient appears to be doing well with regard to her wound. It seems like it showed signs of improvement compared to last week where she had more infection noted. With that being said I do believe that based on what I am seeing the patient seems to be making excellent progress although I think still this is a small area that is going require some debridement to clear away some of the callus and drainage which is trapping fluid behind it based on what I see. 06/23/2020 upon evaluation today patient appears to be doing well with regard to her leg ulcer. She has been tolerating the dressing changes without application. And currently I do believe the alginate is doing a good job keeping it dry it still tends to cover over and we do not want her trapping any fluid therefore I do remove that outer callus/drainage covering each time she comes in. Overall I think this is doing a great job. 06/30/2020 patient appears to be doing okay in regard to pain in regard to her wound. Unfortunately she continues to develop a callus buildup over the area. This is unfortunate as to be honest it is continuing to cause problems with her basically being able to heal as she needs to really granulate in from the bottom out on that is not  happening. I think we need to find something that works better to keep this area open. 07/10/2020 upon evaluation today patient unfortunately appears to be having increased pain yet again and is not doing nearly as well as she was previous. I do believe this is indication of infection. The patient is going to require a culture today it appears. 07/18/2020 upon evaluation today patient appears to be doing well with regard to her wounds. She has been tolerating the dressing changes without complication. In fact this appears to be doing significantly better compared to last week. I did review her however her pathology which showed acute osteomyelitis. Also did review the culture which showed Staph aureus. She is on Bactrim that does seem to be helping. I am unlikely going to continue that for a total of 2 months. I think they would prefer that over going to infectious disease. I am definitely okay with that as far as that is concerned. I am going to likely go ahead and refill the Bactrim next week for another 2 weeks supply in order to get the patient through for this month and then will have to do 1 more month for a full 8  weeks of treatment. Several of the times that I sent patients to infectious disease they just kept the patient on oral antibiotic therapy as opposed to switching IV anyway and she seems to be doing very well after just 1 week. Objective Constitutional Well-nourished and well-hydrated in no acute distress. Vitals Time Taken: 11:45 AM, Height: 62 in, Weight: 140 lbs, BMI: 25.6, Temperature: 98.8 F, Pulse: 71 bpm, Respiratory Rate: 18 breaths/min, Blood Pressure: 179/81 mmHg. Respiratory [name], Crystal S. ([id number]) normal breathing without difficulty. Psychiatric this patient is able to make decisions and demonstrates good insight into disease process. Alert and Oriented x 3. pleasant and cooperative. General Notes: Upon inspection patient's wound bed actually showed signs of good  granulation epithelization at this point. There does not appear to be any signs of infection which is great news and overall very pleased with where things stand today. No fevers, chills, nausea, vomiting, or diarrhea. Integumentary (Hair, Skin) Wound #3 status is Open. Original cause of wound was Gradually Appeared. The date acquired was: 04/09/2019. The wound has been in treatment 40 weeks. The wound is located on the Right Metatarsal head fifth. The wound measures 0.2cm length x 0.2cm width x 0.2cm depth; 0.031cm^2 area and 0.006cm^3 volume. There is Fat Layer (Subcutaneous Tissue) exposed. There is no tunneling or undermining noted. There is a none present amount of drainage noted. The wound margin is distinct with the outline attached to the wound base. There is small (1-33%) red, pink granulation within the wound bed. There is no necrotic tissue within the wound bed. Assessment Active Problems ICD-10 Other specified peripheral vascular diseases Non-pressure chronic ulcer of other part of right foot with fat layer exposed Unspecified open wound of left upper arm, initial encounter Long term (current) use of anticoagulants Paroxysmal atrial fibrillation Tinea pedis Plan Follow-up Appointments: Return Appointment in 1 week. Home Health: [facility]: - [place] for wound care. May utilize formulary equivalent dressing for wound treatment orders unless otherwise specified. Home Health Nurse may visit [facility] to address patient s wound care needs. Scheduled days for dressing changes to be completed; exception, patient has scheduled wound care visit that day. **Please direct any NON-WOUND related issues/requests for orders to patient's Primary Care Physician. **If current dressing causes regression in wound condition, may D/C ordered dressing product/s and apply Normal Saline Moist Dressing daily until next [place] or Other MD appointment. **Notify [facility] of regression in wound condition at [phone]. Medications-Please add to medication list.: P.O. Antibiotics - Bactrim extended x 14 days WOUND #3: - Metatarsal head fifth Wound Laterality: Right Cleanser: Normal Saline (Generic) 3 x Per Week/30 Days Discharge Instructions: Wash your hands with soap and water. Remove old dressing, discard into plastic bag and place into trash. Cleanse the wound with Normal Saline prior to applying a clean dressing using gauze sponges, not tissues or cotton balls. Do not scrub or use excessive force. Pat dry using gauze sponges, not tissue or cotton balls. Primary Dressing: Hydrofera Blue Classic Foam Rope Dressing, 9x6 (mm/in) 3 x Per Week/30 Days Discharge Instructions: Cut in half, about 2 cm length-pack in wound-put in a couple drops of saline (extras sent with patient) Secondary Dressing: Mepilex Border Flex, 4x4 (in/in) (Generic) 3 x Per Week/30 Days Discharge Instructions: Apply to wound as directed. Do not cut. 1. Would recommend currently that we go ahead and initiate a continuation of treatment with the Hydrofera Blue rope which I think is can be easier to  get and then the alginate at this point the wound is significantly smaller than last week. 2. I am also can recommend that we continue with the Bactrim the patient has not for this week I will refill that next week and we will go from there. 3. I did also go ahead and discussed as well with the patient the possibility of hyperbaric oxygen therapy. However again that would be dependent on whether or not she fails conservative measures currently. We will see patient back for reevaluation in 1 week here in the clinic. If anything worsens or changes patient will contact our office for additional recommendations. [name], [name] ([id number]) Electronic Signature(s) Signed: 07/18/2020 1:06:36 PM By: Worthy Keeler PA-C Entered By: Worthy Keeler on 07/18/2020 13:06:35 [name], [name] ([id number]) -------------------------------------------------------------------------------- SuperBill Details Patient Name: [name] Date of Service: 07/18/2020 Medical Record Number: [id number] Patient Account Number: [id number] Date of Birth/Sex: October 24, 1929 (87 y.o. F) Treating RN: Dolan Amen Primary Care Provider: Ria Bush Other Clinician: Jeanine Luz Referring Provider: Ria Bush Treating Provider/Extender: Skipper Cliche in Treatment: 40 Diagnosis Coding ICD-10 Codes Code Description I73.89 Other specified peripheral vascular diseases L97.512 Non-pressure chronic ulcer of other part of right foot with fat layer exposed S41.102A Unspecified open wound of left upper arm, initial encounter Z79.01 Long term (current) use of anticoagulants I48.0 Paroxysmal atrial fibrillation B35.3 Tinea pedis Facility Procedures CPT4 Code: 63845364 Description: 99213 - WOUND CARE VISIT-LEV 3 EST PT Modifier: Quantity: 1 Physician Procedures CPT4 Code: 6803212 Description: 24825 - WC PHYS LEVEL 4 - EST PT Modifier: Quantity: 1 CPT4 Code: Description: ICD-10 Diagnosis Description I73.89 Other specified peripheral vascular diseases L97.512 Non-pressure chronic ulcer of other part of right foot with fat layer e S41.102A Unspecified open wound of left upper arm, initial encounter Z79.01 Long  term (current) use of anticoagulants Modifier: xposed Quantity: Electronic Signature(s) Signed: 07/18/2020 1:07:33 PM By: Worthy Keeler PA-C Previous Signature: 07/18/2020 1:06:49 PM Version By: Worthy Keeler PA-C Entered By: Worthy Keeler on 07/18/2020 13:07:33

## 2020-07-18 NOTE — Progress Notes (Addendum)
CRYSTALANN, KORF ([id number]) Visit Report for 07/18/2020 Arrival Information Details Patient Name: [name], [name]. Date of Service: 07/18/2020 11:15 AM Medical Record Number: [id number] Patient Account Number: [id number] Date of Birth/Sex: June 22, 1929 (87 y.o. F) Treating RN: Dolan Amen Primary Care Piper Hassebrock: Ria Bush Other Clinician: Jeanine Luz Referring Neville Pauls: Ria Bush Treating Noeh Sparacino/Extender: Skipper Cliche in Treatment: 48 Visit Information History Since Last Visit Added or deleted any medications: No Patient Arrived: Gilford Rile Had a fall or experienced change in No Arrival Time: 11:41 activities of daily living that may affect Accompanied By: DAUGHTER risk of falls: Transfer Assistance: None Hospitalized since last visit: No Patient Identification Verified: Yes Pain Present Now: No Secondary Verification Process Completed: Yes Patient Requires Transmission-Based No Precautions: Patient Has Alerts: Yes Patient Alerts: 08/31/19 ABI R)1.3 L) 1.31 08/31/19 TBI R).61 L).58 Electronic Signature(s) Signed: 07/18/2020 4:38:50 PM By: Jeanine Luz Entered By: Jeanine Luz on 07/18/2020 11:46:04 [name] ([id number]) -------------------------------------------------------------------------------- Clinic Level of Care Assessment Details Patient Name: [name] Date of Service: 07/18/2020 11:15 AM Medical Record Number: [id number] Patient Account Number: [id number] Date of Birth/Sex: [birth year] (87 y.o. F) Treating RN: Dolan Amen Primary Care Jatorian Renault: Ria Bush Other Clinician: Jeanine Luz Referring Thelonious Kauffmann: Ria Bush Treating Lesieli Bresee/Extender: Skipper Cliche in Treatment: 39 Clinic Level of Care Assessment Items TOOL 4 Quantity Score X - Use when only an EandM is performed on FOLLOW-UP visit 1 0 ASSESSMENTS - Nursing Assessment / Reassessment X - Reassessment of Co-morbidities (includes updates  in patient status) 1 10 X- 1 5 Reassessment of Adherence to Treatment Plan ASSESSMENTS - Wound and Skin Assessment / Reassessment X - Simple Wound Assessment / Reassessment - one wound 1 5 []  - 0 Complex Wound Assessment / Reassessment - multiple wounds []  - 0 Dermatologic / Skin Assessment (not related to wound area) ASSESSMENTS - Focused Assessment []  - Circumferential Edema Measurements - multi extremities 0 []  - 0 Nutritional Assessment / Counseling / Intervention []  - 0 Lower Extremity Assessment (monofilament, tuning fork, pulses) []  - 0 Peripheral Arterial Disease Assessment (using hand held doppler) ASSESSMENTS - Ostomy and/or Continence Assessment and Care []  - Incontinence Assessment and Management 0 []  - 0 Ostomy Care Assessment and Management (repouching, etc.) PROCESS - Coordination of Care X - Simple Patient / Family Education for ongoing care 1 15 []  - 0 Complex (extensive) Patient / Family Education for ongoing care []  - 0 Staff obtains Programmer, systems, Records, Test Results / Process Orders []  - 0 Staff telephones HHA, Nursing Homes / Clarify orders / etc []  - 0 Routine Transfer to another Facility (non-emergent condition) []  - 0 Routine Hospital Admission (non-emergent condition) []  - 0 New Admissions / Biomedical engineer / Ordering NPWT, Apligraf, etc. []  - 0 Emergency Hospital Admission (emergent condition) X- 1 10 Simple Discharge Coordination []  - 0 Complex (extensive) Discharge Coordination PROCESS - Special Needs []  - Pediatric / Minor Patient Management 0 []  - 0 Isolation Patient Management []  - 0 Hearing / Language / Visual special needs []  - 0 Assessment of Community assistance (transportation, D/C planning, etc.) []  - 0 Additional assistance / Altered mentation []  - 0 Support Surface(s) Assessment (bed, cushion, seat, etc.) INTERVENTIONS - Wound Cleansing / Measurement JANITA, CAMBEROS. ([id number]) X- 1 5 Simple Wound Cleansing - one  wound []  - 0 Complex Wound Cleansing - multiple wounds X- 1 5 Wound Imaging (photographs - any number of wounds) []  - 0 Wound Tracing (instead of photographs) X- 1 5 Simple Wound Measurement -  one wound []  - 0 Complex Wound Measurement - multiple wounds INTERVENTIONS - Wound Dressings []  - Small Wound Dressing one or multiple wounds 0 X- 1 15 Medium Wound Dressing one or multiple wounds []  - 0 Large Wound Dressing one or multiple wounds []  - 0 Application of Medications - topical []  - 0 Application of Medications - injection INTERVENTIONS - Miscellaneous []  - External ear exam 0 []  - 0 Specimen Collection (cultures, biopsies, blood, body fluids, etc.) []  - 0 Specimen(s) / Culture(s) sent or taken to Lab for analysis []  - 0 Patient Transfer (multiple staff / Civil Service fast streamer / Similar devices) []  - 0 Simple Staple / Suture removal (25 or less) []  - 0 Complex Staple / Suture removal (26 or more) []  - 0 Hypo / Hyperglycemic Management (close monitor of Blood Glucose) []  - 0 Ankle / Brachial Index (ABI) - do not check if billed separately X- 1 5 Vital Signs Has the patient been seen at the hospital within the last three years: Yes Total Score: 80 Level Of Care: New/Established - Level 3 Electronic Signature(s) Signed: 07/18/2020 4:59:45 PM By: Georges Mouse, Minus Breeding RN Entered By: Georges Mouse, Minus Breeding on 07/18/2020 12:12:25 [name] ([id number]) -------------------------------------------------------------------------------- Encounter Discharge Information Details Patient Name: [name]. Date of Service: 07/18/2020 11:15 AM Medical Record Number: [id number] Patient Account Number: [id number] Date of Birth/Sex: 03-12-1930 (87 y.o. F) Treating RN: Carlene Coria Primary Care Leeman Johnsey: Ria Bush Other Clinician: Jeanine Luz Referring Jonnelle Lawniczak: Ria Bush Treating Nichalos Brenton/Extender: Skipper Cliche in Treatment: 39 Encounter Discharge  Information Items Discharge Condition: Stable Ambulatory Status: Walker Discharge Destination: Home Transportation: Private Auto Accompanied By: daughter Schedule Follow-up Appointment: Yes Clinical Summary of Care: Patient Declined Electronic Signature(s) Signed: 07/20/2020 10:06:31 AM By: Carlene Coria RN Entered By: Carlene Coria on 07/18/2020 12:21:53 [name] ([id number]) -------------------------------------------------------------------------------- Lower Extremity Assessment Details Patient Name: [name]. Date of Service: 07/18/2020 11:15 AM Medical Record Number: [id number] Patient Account Number: [id number] Date of Birth/Sex: 1929/05/14 (87 y.o. F) Treating RN: Dolan Amen Primary Care Emilyn Ruble: Ria Bush Other Clinician: Jeanine Luz Referring Amarrah Meinhart: Ria Bush Treating Chelsie Burel/Extender: Jeri Cos Weeks in Treatment: 40 Electronic Signature(s) Signed: 07/18/2020 4:38:50 PM By: Jeanine Luz Signed: 07/18/2020 4:59:45 PM By: Georges Mouse, Minus Breeding RN Entered By: Jeanine Luz on 07/18/2020 11:48:27 [name] ([id number]) -------------------------------------------------------------------------------- Multi Wound Chart Details Patient Name: CHARLEA, NARDO. Date of Service: 07/18/2020 11:15 AM Medical Record Number: [id number] Patient Account Number: [id number] Date of Birth/Sex: 04-Mar-1930 (87 y.o. F) Treating RN: Dolan Amen Primary Care Lupe Handley: Ria Bush Other Clinician: Jeanine Luz Referring Ragnar Waas: Ria Bush Treating Wanita Derenzo/Extender: Skipper Cliche in Treatment: 40 Vital Signs Height(in): 62 Pulse(bpm): 96 Weight(lbs): 140 Blood Pressure(mmHg): 179/81 Body Mass Index(BMI): 26 Temperature(F): 98.8 Respiratory Rate(breaths/min): 18 Photos: [N/A:N/A] Wound Location: Right Metatarsal head fifth N/A N/A Wounding Event: Gradually Appeared N/A N/A Primary Etiology: Arterial  Insufficiency Ulcer N/A N/A Comorbid History: Arrhythmia, Osteoarthritis N/A N/A Date Acquired: 04/09/2019 N/A N/A Weeks of Treatment: 40 N/A N/A Wound Status: Open N/A N/A Measurements L x W x D (cm) 0.2x0.2x0.2 N/A N/A Area (cm) : 0.031 N/A N/A Volume (cm) : 0.006 N/A N/A % Reduction in Area: 97.30% N/A N/A % Reduction in Volume: 94.70% N/A N/A Classification: Full Thickness Without Exposed N/A N/A Support Structures Exudate Amount: None Present N/A N/A Wound Margin: Distinct, outline attached N/A N/A Granulation Amount: Small (1-33%) N/A N/A Granulation Quality: Red, Pink N/A N/A Necrotic Amount: None Present (0%) N/A N/A Exposed  Structures: Fat Layer (Subcutaneous Tissue): N/A N/A Yes Fascia: No Tendon: No Muscle: No Joint: No Bone: No Epithelialization: Small (1-33%) N/A N/A Treatment Notes Electronic Signature(s) Signed: 07/18/2020 4:59:45 PM By: Georges Mouse, Minus Breeding RN Entered By: Georges Mouse, Minus Breeding on 07/18/2020 12:03:34 [name] ([id number]) -------------------------------------------------------------------------------- [place] Details Patient Name: [name]. Date of Service: 07/18/2020 11:15 AM Medical Record Number: [id number] Patient Account Number: [id number] Date of Birth/Sex: [birth year] (87 y.o. F) Treating RN: Dolan Amen Primary Care Cresta Riden: Ria Bush Other Clinician: Jeanine Luz Referring Edder Bellanca: Ria Bush Treating Jossue Rubenstein/Extender: Jeri Cos Weeks in Treatment: 40 Active Inactive Electronic Signature(s) Signed: 07/18/2020 4:59:45 PM By: Georges Mouse, Minus Breeding RN Entered By: Georges Mouse, Minus Breeding on 07/18/2020 12:03:26 [name] ([id number]) -------------------------------------------------------------------------------- Pain Assessment Details Patient Name: [name] Date of Service: 07/18/2020 11:15 AM Medical Record Number: [id number] Patient Account Number:  [id number] Date of Birth/Sex: 09-22-1929 (87 y.o. F) Treating RN: Dolan Amen Primary Care Fedra Lanter: Ria Bush Other Clinician: Jeanine Luz Referring Jamarious Febo: Ria Bush Treating Camdan Burdi/Extender: Skipper Cliche in Treatment: 40 Active Problems Location of Pain Severity and Description of Pain Patient Has Paino No Site Locations Pain Management and Medication Current Pain Management: Electronic Signature(s) Signed: 07/18/2020 4:38:50 PM By: Jeanine Luz Signed: 07/18/2020 4:59:45 PM By: Georges Mouse, Minus Breeding RN Entered By: Jeanine Luz on 07/18/2020 11:48:17 [name] ([id number]) -------------------------------------------------------------------------------- Patient/Caregiver Education Details Patient Name: [name] Date of Service: 07/18/2020 11:15 AM Medical Record Number: [id number] Patient Account Number: [id number] Date of Birth/Gender: Jul 22, 1929 (87 y.o. F) Treating RN: Dolan Amen Primary Care Physician: Ria Bush Other Clinician: Jeanine Luz Referring Physician: Ria Bush Treating Physician/Extender: Skipper Cliche in Treatment: 87 Education Assessment Education Provided To: Patient Education Topics Provided Wound/Skin Impairment: Methods: Explain/Verbal Responses: State content correctly Electronic Signature(s) Signed: 07/18/2020 4:59:45 PM By: Georges Mouse, Minus Breeding RN Entered By: Georges Mouse, Minus Breeding on 07/18/2020 12:12:37 [name] ([id number]) -------------------------------------------------------------------------------- Wound Assessment Details Patient Name: [name] Date of Service: 07/18/2020 11:15 AM Medical Record Number: [id number] Patient Account Number: [id number] Date of Birth/Sex: 1929-06-14 (87 y.o. F) Treating RN: Dolan Amen Primary Care Melitza Metheny: Ria Bush Other Clinician: Jeanine Luz Referring Ingri Diemer: Ria Bush Treating  Gregor Dershem/Extender: Jeri Cos Weeks in Treatment: 40 Wound Status Wound Number: 3 Primary Etiology: Arterial Insufficiency Ulcer Wound Location: Right Metatarsal head fifth Wound Status: Open Wounding Event: Gradually Appeared Comorbid History: Arrhythmia, Osteoarthritis Date Acquired: 04/09/2019 Weeks Of Treatment: 40 Clustered Wound: No Photos Wound Measurements Length: (cm) 0.2 Width: (cm) 0.2 Depth: (cm) 0.2 Area: (cm) 0.031 Volume: (cm) 0.006 % Reduction in Area: 97.3% % Reduction in Volume: 94.7% Epithelialization: Small (1-33%) Tunneling: No Undermining: No Wound Description Classification: Full Thickness Without Exposed Support Structures Wound Margin: Distinct, outline attached Exudate Amount: None Present Foul Odor After Cleansing: No Slough/Fibrino No Wound Bed Granulation Amount: Small (1-33%) Exposed Structure Granulation Quality: Red, Pink Fascia Exposed: No Necrotic Amount: None Present (0%) Fat Layer (Subcutaneous Tissue) Exposed: Yes Tendon Exposed: No Muscle Exposed: No Joint Exposed: No Bone Exposed: No Treatment Notes Wound #3 (Metatarsal head fifth) Wound Laterality: Right Cleanser Normal Saline Discharge Instruction: Wash your hands with soap and water. Remove old dressing, discard into plastic bag and place into trash. Cleanse the wound with Normal Saline prior to applying a clean dressing using gauze sponges, not tissues or cotton balls. Do not scrub or use excessive force. Pat dry using gauze sponges, not tissue or cotton balls. [place] ([id number]) Topical  Primary Dressing Hydrofera Blue Classic Foam Rope Dressing, 9x6 (mm/in) Discharge Instruction: Cut in half, about 2 cm length-pack in wound-put in a couple drops of saline (extras sent with patient) Secondary Dressing Mepilex Border Flex, 4x4 (in/in) Discharge Instruction: Apply to wound as directed. Do not cut. Secured With Compression Wrap Compression  Stockings Environmental education officer) Signed: 07/18/2020 4:38:50 PM By: Jeanine Luz Signed: 07/18/2020 4:59:45 PM By: Georges Mouse, Minus Breeding RN Entered By: Jeanine Luz on 07/18/2020 11:55:23 [name] ([id number]) -------------------------------------------------------------------------------- Vitals Details Patient Name: [name] Date of Service: 07/18/2020 11:15 AM Medical Record Number: [id number] Patient Account Number: [id number] Date of Birth/Sex: December 16, 1929 (87 y.o. F) Treating RN: Dolan Amen Primary Care Choua Ikner: Ria Bush Other Clinician: Jeanine Luz Referring Telly Jawad: Ria Bush Treating Keghan Mcfarren/Extender: Skipper Cliche in Treatment: 40 Vital Signs Time Taken: 11:45 Temperature (F): 98.8 Height (in): 62 Pulse (bpm): 71 Weight (lbs): 140 Respiratory Rate (breaths/min): 18 Body Mass Index (BMI): 25.6 Blood Pressure (mmHg): 179/81 Reference Range: 80 - 120 mg / dl Electronic Signature(s) Signed: 07/18/2020 4:38:50 PM By: Jeanine Luz Entered By: Jeanine Luz on 07/18/2020 11:48:03

## 2020-07-19 DIAGNOSIS — E538 Deficiency of other specified B group vitamins: Secondary | ICD-10-CM | POA: Diagnosis not present

## 2020-07-19 DIAGNOSIS — S62102D Fracture of unspecified carpal bone, left wrist, subsequent encounter for fracture with routine healing: Secondary | ICD-10-CM | POA: Diagnosis not present

## 2020-07-19 DIAGNOSIS — E559 Vitamin D deficiency, unspecified: Secondary | ICD-10-CM | POA: Diagnosis not present

## 2020-07-19 DIAGNOSIS — I272 Pulmonary hypertension, unspecified: Secondary | ICD-10-CM | POA: Diagnosis not present

## 2020-07-19 DIAGNOSIS — Z792 Long term (current) use of antibiotics: Secondary | ICD-10-CM | POA: Diagnosis not present

## 2020-07-19 DIAGNOSIS — G2581 Restless legs syndrome: Secondary | ICD-10-CM | POA: Diagnosis not present

## 2020-07-19 DIAGNOSIS — L97513 Non-pressure chronic ulcer of other part of right foot with necrosis of muscle: Secondary | ICD-10-CM | POA: Diagnosis not present

## 2020-07-19 DIAGNOSIS — Z9181 History of falling: Secondary | ICD-10-CM | POA: Diagnosis not present

## 2020-07-19 DIAGNOSIS — C439 Malignant melanoma of skin, unspecified: Secondary | ICD-10-CM | POA: Insufficient documentation

## 2020-07-19 DIAGNOSIS — M810 Age-related osteoporosis without current pathological fracture: Secondary | ICD-10-CM | POA: Diagnosis not present

## 2020-07-19 DIAGNOSIS — I5032 Chronic diastolic (congestive) heart failure: Secondary | ICD-10-CM | POA: Diagnosis not present

## 2020-07-19 DIAGNOSIS — I872 Venous insufficiency (chronic) (peripheral): Secondary | ICD-10-CM | POA: Diagnosis not present

## 2020-07-19 DIAGNOSIS — I2510 Atherosclerotic heart disease of native coronary artery without angina pectoris: Secondary | ICD-10-CM | POA: Diagnosis not present

## 2020-07-19 DIAGNOSIS — Z7901 Long term (current) use of anticoagulants: Secondary | ICD-10-CM | POA: Diagnosis not present

## 2020-07-19 DIAGNOSIS — K219 Gastro-esophageal reflux disease without esophagitis: Secondary | ICD-10-CM | POA: Diagnosis not present

## 2020-07-19 DIAGNOSIS — M1990 Unspecified osteoarthritis, unspecified site: Secondary | ICD-10-CM | POA: Diagnosis not present

## 2020-07-19 DIAGNOSIS — N1831 Chronic kidney disease, stage 3a: Secondary | ICD-10-CM | POA: Diagnosis not present

## 2020-07-19 DIAGNOSIS — G9009 Other idiopathic peripheral autonomic neuropathy: Secondary | ICD-10-CM | POA: Diagnosis not present

## 2020-07-19 DIAGNOSIS — I130 Hypertensive heart and chronic kidney disease with heart failure and stage 1 through stage 4 chronic kidney disease, or unspecified chronic kidney disease: Secondary | ICD-10-CM | POA: Diagnosis not present

## 2020-07-19 DIAGNOSIS — E7849 Other hyperlipidemia: Secondary | ICD-10-CM | POA: Diagnosis not present

## 2020-07-19 DIAGNOSIS — I4891 Unspecified atrial fibrillation: Secondary | ICD-10-CM | POA: Diagnosis not present

## 2020-07-19 DIAGNOSIS — M5136 Other intervertebral disc degeneration, lumbar region: Secondary | ICD-10-CM | POA: Diagnosis not present

## 2020-07-19 DIAGNOSIS — Z7982 Long term (current) use of aspirin: Secondary | ICD-10-CM | POA: Diagnosis not present

## 2020-07-20 DIAGNOSIS — I5032 Chronic diastolic (congestive) heart failure: Secondary | ICD-10-CM | POA: Diagnosis not present

## 2020-07-20 DIAGNOSIS — L97513 Non-pressure chronic ulcer of other part of right foot with necrosis of muscle: Secondary | ICD-10-CM | POA: Diagnosis not present

## 2020-07-20 DIAGNOSIS — S62102D Fracture of unspecified carpal bone, left wrist, subsequent encounter for fracture with routine healing: Secondary | ICD-10-CM | POA: Diagnosis not present

## 2020-07-20 DIAGNOSIS — E538 Deficiency of other specified B group vitamins: Secondary | ICD-10-CM | POA: Diagnosis not present

## 2020-07-20 DIAGNOSIS — N1831 Chronic kidney disease, stage 3a: Secondary | ICD-10-CM | POA: Diagnosis not present

## 2020-07-20 DIAGNOSIS — E7849 Other hyperlipidemia: Secondary | ICD-10-CM | POA: Diagnosis not present

## 2020-07-20 DIAGNOSIS — Z792 Long term (current) use of antibiotics: Secondary | ICD-10-CM | POA: Diagnosis not present

## 2020-07-20 DIAGNOSIS — M5136 Other intervertebral disc degeneration, lumbar region: Secondary | ICD-10-CM | POA: Diagnosis not present

## 2020-07-20 DIAGNOSIS — M1990 Unspecified osteoarthritis, unspecified site: Secondary | ICD-10-CM | POA: Diagnosis not present

## 2020-07-20 DIAGNOSIS — I4891 Unspecified atrial fibrillation: Secondary | ICD-10-CM | POA: Diagnosis not present

## 2020-07-20 DIAGNOSIS — G2581 Restless legs syndrome: Secondary | ICD-10-CM | POA: Diagnosis not present

## 2020-07-20 DIAGNOSIS — I272 Pulmonary hypertension, unspecified: Secondary | ICD-10-CM | POA: Diagnosis not present

## 2020-07-20 DIAGNOSIS — Z9181 History of falling: Secondary | ICD-10-CM | POA: Diagnosis not present

## 2020-07-20 DIAGNOSIS — K219 Gastro-esophageal reflux disease without esophagitis: Secondary | ICD-10-CM | POA: Diagnosis not present

## 2020-07-20 DIAGNOSIS — I2510 Atherosclerotic heart disease of native coronary artery without angina pectoris: Secondary | ICD-10-CM | POA: Diagnosis not present

## 2020-07-20 DIAGNOSIS — I130 Hypertensive heart and chronic kidney disease with heart failure and stage 1 through stage 4 chronic kidney disease, or unspecified chronic kidney disease: Secondary | ICD-10-CM | POA: Diagnosis not present

## 2020-07-20 DIAGNOSIS — I872 Venous insufficiency (chronic) (peripheral): Secondary | ICD-10-CM | POA: Diagnosis not present

## 2020-07-20 DIAGNOSIS — E559 Vitamin D deficiency, unspecified: Secondary | ICD-10-CM | POA: Diagnosis not present

## 2020-07-20 DIAGNOSIS — Z7901 Long term (current) use of anticoagulants: Secondary | ICD-10-CM | POA: Diagnosis not present

## 2020-07-20 DIAGNOSIS — Z7982 Long term (current) use of aspirin: Secondary | ICD-10-CM | POA: Diagnosis not present

## 2020-07-20 DIAGNOSIS — M810 Age-related osteoporosis without current pathological fracture: Secondary | ICD-10-CM | POA: Diagnosis not present

## 2020-07-20 DIAGNOSIS — G9009 Other idiopathic peripheral autonomic neuropathy: Secondary | ICD-10-CM | POA: Diagnosis not present

## 2020-07-21 DIAGNOSIS — L97511 Non-pressure chronic ulcer of other part of right foot limited to breakdown of skin: Secondary | ICD-10-CM | POA: Diagnosis not present

## 2020-07-24 DIAGNOSIS — I872 Venous insufficiency (chronic) (peripheral): Secondary | ICD-10-CM | POA: Diagnosis not present

## 2020-07-24 DIAGNOSIS — I130 Hypertensive heart and chronic kidney disease with heart failure and stage 1 through stage 4 chronic kidney disease, or unspecified chronic kidney disease: Secondary | ICD-10-CM | POA: Diagnosis not present

## 2020-07-24 DIAGNOSIS — N1831 Chronic kidney disease, stage 3a: Secondary | ICD-10-CM

## 2020-07-24 DIAGNOSIS — S62102D Fracture of unspecified carpal bone, left wrist, subsequent encounter for fracture with routine healing: Secondary | ICD-10-CM | POA: Diagnosis not present

## 2020-07-24 DIAGNOSIS — E7849 Other hyperlipidemia: Secondary | ICD-10-CM | POA: Diagnosis not present

## 2020-07-24 DIAGNOSIS — I4891 Unspecified atrial fibrillation: Secondary | ICD-10-CM

## 2020-07-24 DIAGNOSIS — I272 Pulmonary hypertension, unspecified: Secondary | ICD-10-CM

## 2020-07-24 DIAGNOSIS — L97513 Non-pressure chronic ulcer of other part of right foot with necrosis of muscle: Secondary | ICD-10-CM | POA: Diagnosis not present

## 2020-07-24 DIAGNOSIS — I5032 Chronic diastolic (congestive) heart failure: Secondary | ICD-10-CM | POA: Diagnosis not present

## 2020-07-24 DIAGNOSIS — G2581 Restless legs syndrome: Secondary | ICD-10-CM | POA: Diagnosis not present

## 2020-07-24 DIAGNOSIS — Z9181 History of falling: Secondary | ICD-10-CM

## 2020-07-24 DIAGNOSIS — M1990 Unspecified osteoarthritis, unspecified site: Secondary | ICD-10-CM

## 2020-07-24 DIAGNOSIS — M5136 Other intervertebral disc degeneration, lumbar region: Secondary | ICD-10-CM | POA: Diagnosis not present

## 2020-07-24 DIAGNOSIS — F419 Anxiety disorder, unspecified: Secondary | ICD-10-CM

## 2020-07-24 DIAGNOSIS — K219 Gastro-esophageal reflux disease without esophagitis: Secondary | ICD-10-CM

## 2020-07-24 DIAGNOSIS — R32 Unspecified urinary incontinence: Secondary | ICD-10-CM

## 2020-07-24 DIAGNOSIS — G9009 Other idiopathic peripheral autonomic neuropathy: Secondary | ICD-10-CM | POA: Diagnosis not present

## 2020-07-24 DIAGNOSIS — I2510 Atherosclerotic heart disease of native coronary artery without angina pectoris: Secondary | ICD-10-CM | POA: Diagnosis not present

## 2020-07-24 DIAGNOSIS — E559 Vitamin D deficiency, unspecified: Secondary | ICD-10-CM | POA: Diagnosis not present

## 2020-07-24 DIAGNOSIS — M810 Age-related osteoporosis without current pathological fracture: Secondary | ICD-10-CM | POA: Diagnosis not present

## 2020-07-24 DIAGNOSIS — Z792 Long term (current) use of antibiotics: Secondary | ICD-10-CM | POA: Diagnosis not present

## 2020-07-24 DIAGNOSIS — Z7982 Long term (current) use of aspirin: Secondary | ICD-10-CM | POA: Diagnosis not present

## 2020-07-24 DIAGNOSIS — E538 Deficiency of other specified B group vitamins: Secondary | ICD-10-CM

## 2020-07-24 DIAGNOSIS — Z7901 Long term (current) use of anticoagulants: Secondary | ICD-10-CM | POA: Diagnosis not present

## 2020-07-25 ENCOUNTER — Encounter: Payer: Medicare Other | Admitting: Physician Assistant

## 2020-07-25 ENCOUNTER — Other Ambulatory Visit: Payer: Self-pay

## 2020-07-25 DIAGNOSIS — L97512 Non-pressure chronic ulcer of other part of right foot with fat layer exposed: Secondary | ICD-10-CM | POA: Diagnosis not present

## 2020-07-25 DIAGNOSIS — Z7901 Long term (current) use of anticoagulants: Secondary | ICD-10-CM | POA: Diagnosis not present

## 2020-07-25 DIAGNOSIS — M86671 Other chronic osteomyelitis, right ankle and foot: Secondary | ICD-10-CM | POA: Diagnosis not present

## 2020-07-25 DIAGNOSIS — I480 Paroxysmal atrial fibrillation: Secondary | ICD-10-CM | POA: Diagnosis not present

## 2020-07-25 DIAGNOSIS — B353 Tinea pedis: Secondary | ICD-10-CM | POA: Diagnosis not present

## 2020-07-25 DIAGNOSIS — I739 Peripheral vascular disease, unspecified: Secondary | ICD-10-CM | POA: Diagnosis not present

## 2020-07-25 DIAGNOSIS — I70235 Atherosclerosis of native arteries of right leg with ulceration of other part of foot: Secondary | ICD-10-CM | POA: Diagnosis not present

## 2020-07-25 NOTE — Progress Notes (Addendum)
[name], [name] ([id number]) Visit Report for 07/25/2020 Chief Complaint Document Details Patient Name: [name], [name]. Date of Service: 07/25/2020 2:00 PM Medical Record Number: [id number] Patient Account Number: [id number] Date of Birth/Sex: 09-Nov-1929 (87 y.o. F) Treating RN: Dolan Amen Primary Care Provider: Ria Bush Other Clinician: Jeanine Luz Referring Provider: Ria Bush Treating Provider/Extender: Skipper Cliche in Treatment: 56 Information Obtained from: Patient Chief Complaint Right Lateral Foot Ulcer Electronic Signature(s) Signed: 07/25/2020 2:25:23 PM By: Worthy Keeler PA-C Entered By: Worthy Keeler on 07/25/2020 14:25:22 [name], [name] ([id number]) -------------------------------------------------------------------------------- Debridement Details Patient Name: [name] Date of Service: 07/25/2020 2:00 PM Medical Record Number: [id number] Patient Account Number: [id number] Date of Birth/Sex: August 03, 1929 (87 y.o. F) Treating RN: Dolan Amen Primary Care Provider: Ria Bush Other Clinician: Jeanine Luz Referring Provider: Ria Bush Treating Provider/Extender: Skipper Cliche in Treatment: 41 Debridement Performed for Wound #3 Right Metatarsal head fifth Assessment: Performed By: Physician Tommie Sams., PA-C Debridement Type: Debridement Severity of Tissue Pre Debridement: Fat layer exposed Level of Consciousness (Pre- Awake and Alert procedure): Pre-procedure Verification/Time Out Yes - 14:31 Taken: Start Time: 14:31 Pain Control: Lidocaine 4% Topical Solution Total Area Debrided (L x W): 0.2 (cm) x 0.2 (cm) = 0.04 (cm) Tissue and other material Viable, Non-Viable, Callus, Slough, Subcutaneous, Slough debrided: Level: Skin/Subcutaneous Tissue Debridement Description: Excisional Instrument: Curette Bleeding: Minimum Hemostasis Achieved: Pressure Response to Treatment: Procedure was  tolerated well Level of Consciousness (Post- Awake and Alert procedure): Post Debridement Measurements of Total Wound Length: (cm) 0.1 Width: (cm) 0.1 Depth: (cm) 0.2 Volume: (cm) 0.002 Character of Wound/Ulcer Post Debridement: Stable Severity of Tissue Post Debridement: Fat layer exposed Post Procedure Diagnosis Same as Pre-procedure Electronic Signature(s) Signed: 07/25/2020 3:17:20 PM By: Worthy Keeler PA-C Signed: 07/25/2020 5:05:23 PM By: Georges Mouse, Minus Breeding RN Entered By: Worthy Keeler on 07/25/2020 15:17:20 [name] ([id number]) -------------------------------------------------------------------------------- HPI Details Patient Name: [name]. Date of Service: 07/25/2020 2:00 PM Medical Record Number: [id number] Patient Account Number: [id number] Date of Birth/Sex: [birth year] (87 y.o. F) Treating RN: Dolan Amen Primary Care Provider: Ria Bush Other Clinician: Jeanine Luz Referring Provider: Ria Bush Treating Provider/Extender: Skipper Cliche in Treatment: 73 History of Present Illness HPI Description: 10/12/2019 upon evaluation today patient appears to be doing somewhat poorly in regard to her wound at this point. She has a ulcer over the right fifth metatarsal region. This has been managed by podiatry up to this point. Unfortunately the patient just does not seem to be making the progress that they were hoping for an subsequently the patient was referred to [place] by [facility] Dr. Posey Pronto. With that being said I did note that the patient does have peripheral vascular disease which has been managed by Dr. Lazaro Arms her most recent angiogram with intervention was on 08/02/2019. The ABIs following were on the right 1.30 with a TBI of 0.61 normal left 1.31 with a TBI of 0.58. Obviously things seem to be doing somewhat better at this point hopefully enough to allow her to heal. She is on anticoagulant therapy due to  atrial fibrillation long-term. With that being said the patient is unfortunately having quite a bit of discomfort at this time secondary to the wound. She also has erythema which is very likely secondary to infection. I am going obtain a culture of the area today once debridement is complete. Apparently the patient also had a x-ray that was performed by Dr. Posey Pronto on 09/21/2019  and this showed that the patient had no obvious signs on x-ray of cortical irregularity and no osteomyelitis obviously noted. With that being said as I discussed with the patient's daughter as well this does not indicate that the patient absolutely has no osteomyelitis as x-rays are only at best 50% helpful in identifying this complication. Nonetheless I do believe that we need to have the patient continue to have this further evaluated specifically I am to recommend that we check into an MRI which I think would be ideal to evaluate whether or not there is osteomyelitis present. 7/16; this is a patient with an ulcer over the right fifth metatarsal head. She follows with Dr. Lucky Cowboy [facility]. She is due to have an MRI of the foot on 7/26. Using Santyl to the wound 11/04/2019 on evaluation today patient appears to be doing okay in regard to her wound at this time. Unfortunately since I last saw her which was actually her second visit here in the clinic she has moved into a new apartment and then subsequently had a fall. She ended up in the hospital she did fracture her left arm and subsequently has also been dealing with a lot of dizziness she has been at [employer]. She tells me that getting in the change the dressing has been somewhat difficult as far as daily dressing changes are concerned with the Santyl. Nonetheless when they do change it that is what is been used and her daughter-in-law has also been coming in to help out as well with the dressing changes to try to make sure it gets done daily. Fortunately  there is no signs of active infection at this time. No fever chills noted. I do believe the Santyl has been beneficial and is likely still the best way to go at this point for the patient. I did review the patient's MRI which showed some potential for reactive osteitis but did not show any signs of osteomyelitis overtly. That cannot be excluded but again right now it does not appear that is very likely present as well. This is good news. I also did review her culture she has been on the Bactrim she tells me she has taken this and again that was appropriate based on the results of her culture. 11/19/2019 upon evaluation today patient appears to be doing decently well in regard to her wound. Fortunately there is no signs of active infection at this time wound is not significantly larger in fact is roughly about the same at this point. With that being said there is not as much erythema as previously noted I think the infection seems to be under much better control which is great news. Unfortunately she has fallen since I last saw her and broken her left arm she has an appointment with them following the appointment with me. 8/25; patient was worked in acutely today at the request of home health and was concerned about her right heel, generalized erythema in her feet bilaterally. According to her daughter things have been getting worse over the last for 5 days but not precisely from a wound care point of view. Her wound is on the lateral part of the right fifth metatarsal head looks about the same as I remember this. She has developed dryness and flaking of the skin on her plantar feet. As well a very tender area localized over the insertion point of the Achilles tendon. She has restless leg syndrome. Does not specifically offload the area at  night. She wears socks at home and does not really give me a pressure on the right heel issue. I reviewed her vascular status. She underwent her last revascularization  on 08/02/2019 by Dr. Lucky Cowboy. This included an angioplasty of the right peroneal artery as well as the right posterior tibial artery. Her last noninvasive arterial study was on 11/23/2019. On the right her ABI was 1.08 TBI of 0.48. On the left ABI at 1.21 with a TBI of 0.54. Family relates that Dr. Lucky Cowboy felt that he had done the best he could and this. We have been using Santyl to the one wound she has on the right lateral fifth metatarsal head. She does not have a new wound today which is the concern that letter coming into the clinic 12/09/2019 on evaluation today patient appears to be doing decently well in regard to her foot ulcer at this point. She does note that with the compression wrap that the home health nurse put on after contacting our clinic that she actually did much better and felt much better. Her heel is also improved. Fortunately there is no signs of active infection at this time. No fevers, chills, nausea, vomiting, or diarrhea. 12/24/2019 upon evaluation today patient appears to be doing somewhat poorly still in regard to her foot. Unfortunately this is just not showing the signs of improvement that I would like to see it is a little bit cleaner but also appears to be erythematous and I believe there is infection noted at this point. We will get a need to address this in my opinion. I did actually recommend that we switch back to the Iodoflex as well apparently the home health nurse had switch this away that not really what we want to do at this point in my opinion. They were using silver alginate I think most recently. 01/06/2020 upon evaluation today patient actually appears to be doing much better in regard to her wound. We have switched back to the silver alginate after her home health nurse contacted me and honestly I feel like she is doing quite well in that regard. She does have a lot of drainage and therefore I think that is probably a better option for. With that being said she is  tolerating the compression wrap without complication that also seems to be helping. 01/28/2020 on evaluation today patient appears to be doing well with regard to her foot ulcer. I do feel like she is showing signs of improvement which is great news. There is no signs of active infection at this time which is also good news. No fevers, chills, nausea, vomiting, or diarrhea. Unfortunately she did have a fall and sustained an injury to the left upper arm. She has a skin tear here this appears to be doing okay although ALIZZA, SACRA. ([id number]) the dressing was stuck to this this was an alginate dressing. I really think she probably needs something like Xeroform to try to prevent anything from sticking to this region. Other than that she seems to be doing quite well. 02/11/2020 on evaluation today patient's arm skin tear appears to be completely healed which is great. Unfortunately she is still having issues at this time with her foot although this seems to be showing signs of improvement it still very slow. There is no signs of active infection at this time. 03/06/2020 upon evaluation today patient appears to be doing decently well in regard to her foot ulcer. There is some minimal slough noted on the surface of  the wound building up at this point. Otherwise I really feel like she is doing quite nicely with quite a bit of new granulation and epithelial growth. 03/23/2020 upon evaluation today patient appears to be doing well with regards to her wound. She has been tolerating the dressing changes without complication. Fortunately there is no evidence of active infection and overall feel like her foot ulcer is doing excellent at this point. 04/13/2020 on evaluation today patient appears to be doing well with regard to her foot ulcer. She has been tolerating the dressing changes without complication. Fortunately there is no signs of active infection at this time. Overall I feel like the wound is getting  close to complete closure and this appears to be doing quite well in my opinion. 04/27/2020 on evaluation today patient appears to be doing excellent in regard to her wound. She has been tolerating the dressing changes without complication. Fortunately there is no signs of active infection and overall very pleased with where things stand today. In fact I feel like the wound is very close to complete resolution. 05/18/2020 upon evaluation today patient appears to be doing well with regard to her foot ulcer. There is no evidence of active infection at this time which is great news and overall very pleased with where things stand. No fevers, chills, nausea, vomiting, or diarrhea. I do feel like she is making great progress and I feel like that the wound is very tiny but again this last portion getting close has been somewhat difficult. 07/05/2020 upon evaluation today patient appears to be doing a little poorly in regard to her foot ulcer. Fortunately there is no signs of active infection at this time systemically though locally I feel it may be some cellulitis it looks like this wound may have prematurely sealed up subsequently causing a blister that had to be removed. The good news is that I do not think it is a very bad infection but I believe she may be experiencing cellulitis. 06/16/2020 upon evaluation today patient appears to be doing well with regard to her wound. It seems like it showed signs of improvement compared to last week where she had more infection noted. With that being said I do believe that based on what I am seeing the patient seems to be making excellent progress although I think still this is a small area that is going require some debridement to clear away some of the callus and drainage which is trapping fluid behind it based on what I see. 06/23/2020 upon evaluation today patient appears to be doing well with regard to her leg ulcer. She has been tolerating the dressing  changes without application. And currently I do believe the alginate is doing a good job keeping it dry it still tends to cover over and we do not want her trapping any fluid therefore I do remove that outer callus/drainage covering each time she comes in. Overall I think this is doing a great job. 06/30/2020 patient appears to be doing okay in regard to pain in regard to her wound. Unfortunately she continues to develop a callus buildup over the area. This is unfortunate as to be honest it is continuing to cause problems with her basically being able to heal as she needs to really granulate in from the bottom out on that is not happening. I think we need to find something that works better to keep this area open. 07/10/2020 upon evaluation today patient unfortunately appears to be having increased pain  yet again and is not doing nearly as well as she was previous. I do believe this is indication of infection. The patient is going to require a culture today it appears. 07/18/2020 upon evaluation today patient appears to be doing well with regard to her wounds. She has been tolerating the dressing changes without complication. In fact this appears to be doing significantly better compared to last week. I did review her however her pathology which showed acute osteomyelitis. Also did review the culture which showed Staph aureus. She is on Bactrim that does seem to be helping. I am unlikely going to continue that for a total of 2 months. I think they would prefer that over going to infectious disease. I am definitely okay with that as far as that is concerned. I am going to likely go ahead and refill the Bactrim next week for another 2 weeks supply in order to get the patient through for this month and then will have to do 1 more month for a full 8 weeks of treatment. Several of the times that I sent patients to infectious disease they just kept the patient on oral antibiotic therapy as opposed to switching  IV anyway and she seems to be doing very well after just 1 week. 07/25/2020 upon evaluation today patient appears to be doing well with regard to her wound. She still has an opening and there is still some depth there as well. With that being said I do think that the patient does have osteomyelitis and very well could be a candidate for hyperbaric oxygen therapy. However I [name] see how she does over the next 2 weeks with the oral antibiotics. Obviously if she is not close by that time I think proceeding with the HBO therapy would be appropriate. I did discuss this with the patient and her daughter today. I think that this may be something to strongly consider going forward if things do not improve over again the next 2 weeks. Electronic Signature(s) Signed: 07/25/2020 5:58:06 PM By: Worthy Keeler PA-C Entered By: Worthy Keeler on 07/25/2020 17:58:06 [name], [name] ([id number]) -------------------------------------------------------------------------------- Physical Exam Details Patient Name: [name], [name]. Date of Service: 07/25/2020 2:00 PM Medical Record Number: [id number] Patient Account Number: [id number] Date of Birth/Sex: 1929-09-24 (87 y.o. F) Treating RN: Dolan Amen Primary Care Provider: Ria Bush Other Clinician: Jeanine Luz Referring Provider: Ria Bush Treating Provider/Extender: Skipper Cliche in Treatment: 53 Constitutional Well-nourished and well-hydrated in no acute distress. Respiratory normal breathing without difficulty. Psychiatric this patient is able to make decisions and demonstrates good insight into disease process. Alert and Oriented x 3. pleasant and cooperative. Notes Upon inspection patient's wound did require sharp debridement to remove some of the necrotic debris and callus around the edges of the wound. I did this as carefully as possible today. She tolerated that without complication. With that being said I am going to  continue to monitor and see how things go as far as the overall healing is concerned. I believe that the patient likely may proceed to the need for hyperbaric oxygen therapy but again she may be healed as well when I see her in a couple weeks. The time between now and 2 weeks from now when I see her we will make that determination. Electronic Signature(s) Signed: 07/25/2020 5:58:43 PM By: Worthy Keeler PA-C Entered By: Worthy Keeler on 07/25/2020 17:58:43 [name], WIENS ([id number]) -------------------------------------------------------------------------------- Physician Orders Details Patient Name: [name] Date of Service:  07/25/2020 2:00 PM Medical Record Number: [id number] Patient Account Number: [id number] Date of Birth/Sex: 1930-02-24 (87 y.o. F) Treating RN: Dolan Amen Primary Care Provider: Ria Bush Other Clinician: Jeanine Luz Referring Provider: Ria Bush Treating Provider/Extender: Skipper Cliche in Treatment: 59 Verbal / Phone Orders: No Diagnosis Coding ICD-10 Coding Code Description I73.89 Other specified peripheral vascular diseases L97.512 Non-pressure chronic ulcer of other part of right foot with fat layer exposed S41.102A Unspecified open wound of left upper arm, initial encounter Z79.01 Long term (current) use of anticoagulants I48.0 Paroxysmal atrial fibrillation B35.3 Tinea pedis Follow-up Appointments o Return Appointment in 2 weeks. [place]: - [place] for wound care. May utilize formulary equivalent dressing for wound treatment orders unless otherwise specified. Home Health Nurse may visit PRN to address patientos wound care needs. o Scheduled days for dressing changes to be completed; exception, patient has scheduled wound care visit that day. o **Please direct any NON-WOUND related issues/requests for orders to patient's Primary Care Physician. **If  current dressing causes regression in wound condition, may D/C ordered dressing product/s and apply Normal Saline Moist Dressing daily until next [place] or Other MD appointment. **Notify [facility] of regression in wound condition at [phone]. Medications-Please add to medication list. o P.O. Antibiotics - Doxycycline x 2 weeks Wound Treatment Wound #3 - Metatarsal head fifth Wound Laterality: Right Cleanser: Normal Saline (Generic) 2 x Per Week/30 Days Discharge Instructions: Wash your hands with soap and water. Remove old dressing, discard into plastic bag and place into trash. Cleanse the wound with Normal Saline prior to applying a clean dressing using gauze sponges, not tissues or cotton balls. Do not scrub or use excessive force. Pat dry using gauze sponges, not tissue or cotton balls. Primary Dressing: Hydrofera Blue Classic Foam Rope Dressing, 9x6 (mm/in) 2 x Per Week/30 Days Discharge Instructions: Cut in half, about 2 cm length-pack in wound-put in a couple drops of saline (extras sent with patient) Secondary Dressing: Mepilex Border Flex, 4x4 (in/in) (Generic) 2 x Per Week/30 Days Discharge Instructions: Apply to wound as directed. Do not cut. Patient Medications Allergies: penicillin Notifications Medication Indication Start End doxycycline hyclate 07/25/2020 DOSE 1 - oral 100 mg capsule - 1 capsule oral taken 2 times per day for 15 days Electronic Signature(s) Signed: 07/25/2020 3:16:43 PM By: Worthy Keeler PA-C Entered By: Worthy Keeler on 07/25/2020 15:16:43 [name], [name] ([id number]) [name], [name] ([id number]) -------------------------------------------------------------------------------- Problem List Details Patient Name: [name]. Date of Service: 07/25/2020 2:00 PM Medical Record Number: [id number] Patient Account Number: [id number] Date of Birth/Sex: 04-Dec-1929 (87 y.o. F) Treating RN: Dolan Amen Primary Care  Provider: Ria Bush Other Clinician: Jeanine Luz Referring Provider: Ria Bush Treating Provider/Extender: Skipper Cliche in Treatment: 72 Active Problems ICD-10 Encounter Code Description Active Date MDM Diagnosis I73.89 Other specified peripheral vascular diseases 10/12/2019 No Yes L97.512 Non-pressure chronic ulcer of other part of right foot with fat layer 10/12/2019 No Yes exposed M86.671 Other chronic osteomyelitis, right ankle and foot 07/25/2020 No Yes Z79.01 Long term (current) use of anticoagulants 10/12/2019 No Yes I48.0 Paroxysmal atrial fibrillation 10/12/2019 No Yes B35.3 Tinea pedis 12/01/2019 No Yes Inactive Problems Resolved Problems ICD-10 Code Description Active Date Resolved Date S41.102A Unspecified open wound of left upper arm, initial encounter 01/28/2020 01/28/2020 Electronic Signature(s) Signed: 07/25/2020 5:57:48 PM By: Worthy Keeler PA-C Previous Signature: 07/25/2020 2:25:17 PM Version By: Worthy Keeler PA-C Entered By: Melburn Hake,  Kemuel Buchmann on 07/25/2020 17:57:47 [name], [name] ([id number]) -------------------------------------------------------------------------------- Progress Note Details Patient Name: DANAYE, SOBH. Date of Service: 07/25/2020 2:00 PM Medical Record Number: [id number] Patient Account Number: [id number] Date of Birth/Sex: 1929/05/05 (87 y.o. F) Treating RN: Dolan Amen Primary Care Provider: Ria Bush Other Clinician: Jeanine Luz Referring Provider: Ria Bush Treating Provider/Extender: Skipper Cliche in Treatment: 75 Subjective Chief Complaint Information obtained from Patient Right Lateral Foot Ulcer History of Present Illness (HPI) 10/12/2019 upon evaluation today patient appears to be doing somewhat poorly in regard to her wound at this point. She has a ulcer over the right fifth metatarsal region. This has been managed by podiatry up to this point. Unfortunately the patient just does  not seem to be making the progress that they were hoping for an subsequently the patient was referred to [place] by [facility] Dr. Posey Pronto. With that being said I did note that the patient does have peripheral vascular disease which has been managed by Dr. Lazaro Arms her most recent angiogram with intervention was on 08/02/2019. The ABIs following were on the right 1.30 with a TBI of 0.61 normal left 1.31 with a TBI of 0.58. Obviously things seem to be doing somewhat better at this point hopefully enough to allow her to heal. She is on anticoagulant therapy due to atrial fibrillation long-term. With that being said the patient is unfortunately having quite a bit of discomfort at this time secondary to the wound. She also has erythema which is very likely secondary to infection. I am going obtain a culture of the area today once debridement is complete. Apparently the patient also had a x-ray that was performed by Dr. Posey Pronto on 09/21/2019 and this showed that the patient had no obvious signs on x-ray of cortical irregularity and no osteomyelitis obviously noted. With that being said as I discussed with the patient's daughter as well this does not indicate that the patient absolutely has no osteomyelitis as x-rays are only at best 50% helpful in identifying this complication. Nonetheless I do believe that we need to have the patient continue to have this further evaluated specifically I am to recommend that we check into an MRI which I think would be ideal to evaluate whether or not there is osteomyelitis present. 7/16; this is a patient with an ulcer over the right fifth metatarsal head. She follows with Dr. Lucky Cowboy [facility]. She is due to have an MRI of the foot on 7/26. Using Santyl to the wound 11/04/2019 on evaluation today patient appears to be doing okay in regard to her wound at this time. Unfortunately since I last saw her which was actually her second visit here in the clinic she has  moved into a new apartment and then subsequently had a fall. She ended up in the hospital she did fracture her left arm and subsequently has also been dealing with a lot of dizziness she has been at [employer]. She tells me that getting in the change the dressing has been somewhat difficult as far as daily dressing changes are concerned with the Santyl. Nonetheless when they do change it that is what is been used and her daughter-in-law has also been coming in to help out as well with the dressing changes to try to make sure it gets done daily. Fortunately there is no signs of active infection at this time. No fever chills noted. I do believe the Santyl has been beneficial and is likely still the  best way to go at this point for the patient. I did review the patient's MRI which showed some potential for reactive osteitis but did not show any signs of osteomyelitis overtly. That cannot be excluded but again right now it does not appear that is very likely present as well. This is good news. I also did review her culture she has been on the Bactrim she tells me she has taken this and again that was appropriate based on the results of her culture. 11/19/2019 upon evaluation today patient appears to be doing decently well in regard to her wound. Fortunately there is no signs of active infection at this time wound is not significantly larger in fact is roughly about the same at this point. With that being said there is not as much erythema as previously noted I think the infection seems to be under much better control which is great news. Unfortunately she has fallen since I last saw her and broken her left arm she has an appointment with them following the appointment with me. 8/25; patient was worked in acutely today at the request of home health and was concerned about her right heel, generalized erythema in her feet bilaterally. According to her daughter things have been getting worse over the  last for 5 days but not precisely from a wound care point of view. Her wound is on the lateral part of the right fifth metatarsal head looks about the same as I remember this. She has developed dryness and flaking of the skin on her plantar feet. As well a very tender area localized over the insertion point of the Achilles tendon. She has restless leg syndrome. Does not specifically offload the area at night. She wears socks at home and does not really give me a pressure on the right heel issue. I reviewed her vascular status. She underwent her last revascularization on 08/02/2019 by Dr. Lucky Cowboy. This included an angioplasty of the right peroneal artery as well as the right posterior tibial artery. Her last noninvasive arterial study was on 11/23/2019. On the right her ABI was 1.08 TBI of 0.48. On the left ABI at 1.21 with a TBI of 0.54. Family relates that Dr. Lucky Cowboy felt that he had done the best he could and this. We have been using Santyl to the one wound she has on the right lateral fifth metatarsal head. She does not have a new wound today which is the concern that letter coming into the clinic 12/09/2019 on evaluation today patient appears to be doing decently well in regard to her foot ulcer at this point. She does note that with the compression wrap that the home health nurse put on after contacting our clinic that she actually did much better and felt much better. Her heel is also improved. Fortunately there is no signs of active infection at this time. No fevers, chills, nausea, vomiting, or diarrhea. 12/24/2019 upon evaluation today patient appears to be doing somewhat poorly still in regard to her foot. Unfortunately this is just not showing the signs of improvement that I would like to see it is a little bit cleaner but also appears to be erythematous and I believe there is infection noted at this point. We will get a need to address this in my opinion. I did actually recommend that we switch back to  the Iodoflex as well apparently the home health nurse had switch this away that not really what we want to do at this point in my  opinion. They were using silver alginate I think most recently. 01/06/2020 upon evaluation today patient actually appears to be doing much better in regard to her wound. We have switched back to the silver alginate after her home health nurse contacted me and honestly I feel like she is doing quite well in that regard. She does have a lot of drainage and therefore I think that is probably a better option for. With that being said she is tolerating the compression wrap without complication that [name], [name]. ([id number]) also seems to be helping. 01/28/2020 on evaluation today patient appears to be doing well with regard to her foot ulcer. I do feel like she is showing signs of improvement which is great news. There is no signs of active infection at this time which is also good news. No fevers, chills, nausea, vomiting, or diarrhea. Unfortunately she did have a fall and sustained an injury to the left upper arm. She has a skin tear here this appears to be doing okay although the dressing was stuck to this this was an alginate dressing. I really think she probably needs something like Xeroform to try to prevent anything from sticking to this region. Other than that she seems to be doing quite well. 02/11/2020 on evaluation today patient's arm skin tear appears to be completely healed which is great. Unfortunately she is still having issues at this time with her foot although this seems to be showing signs of improvement it still very slow. There is no signs of active infection at this time. 03/06/2020 upon evaluation today patient appears to be doing decently well in regard to her foot ulcer. There is some minimal slough noted on the surface of the wound building up at this point. Otherwise I really feel like she is doing quite nicely with quite a bit of new granulation  and epithelial growth. 03/23/2020 upon evaluation today patient appears to be doing well with regards to her wound. She has been tolerating the dressing changes without complication. Fortunately there is no evidence of active infection and overall feel like her foot ulcer is doing excellent at this point. 04/13/2020 on evaluation today patient appears to be doing well with regard to her foot ulcer. She has been tolerating the dressing changes without complication. Fortunately there is no signs of active infection at this time. Overall I feel like the wound is getting close to complete closure and this appears to be doing quite well in my opinion. 04/27/2020 on evaluation today patient appears to be doing excellent in regard to her wound. She has been tolerating the dressing changes without complication. Fortunately there is no signs of active infection and overall very pleased with where things stand today. In fact I feel like the wound is very close to complete resolution. 05/18/2020 upon evaluation today patient appears to be doing well with regard to her foot ulcer. There is no evidence of active infection at this time which is great news and overall very pleased with where things stand. No fevers, chills, nausea, vomiting, or diarrhea. I do feel like she is making great progress and I feel like that the wound is very tiny but again this last portion getting close has been somewhat difficult. 07/05/2020 upon evaluation today patient appears to be doing a little poorly in regard to her foot ulcer. Fortunately there is no signs of active infection at this time systemically though locally I feel it may be some cellulitis it looks like this wound may have  prematurely sealed up subsequently causing a blister that had to be removed. The good news is that I do not think it is a very bad infection but I believe she may be experiencing cellulitis. 06/16/2020 upon evaluation today patient appears to be doing  well with regard to her wound. It seems like it showed signs of improvement compared to last week where she had more infection noted. With that being said I do believe that based on what I am seeing the patient seems to be making excellent progress although I think still this is a small area that is going require some debridement to clear away some of the callus and drainage which is trapping fluid behind it based on what I see. 06/23/2020 upon evaluation today patient appears to be doing well with regard to her leg ulcer. She has been tolerating the dressing changes without application. And currently I do believe the alginate is doing a good job keeping it dry it still tends to cover over and we do not want her trapping any fluid therefore I do remove that outer callus/drainage covering each time she comes in. Overall I think this is doing a great job. 06/30/2020 patient appears to be doing okay in regard to pain in regard to her wound. Unfortunately she continues to develop a callus buildup over the area. This is unfortunate as to be honest it is continuing to cause problems with her basically being able to heal as she needs to really granulate in from the bottom out on that is not happening. I think we need to find something that works better to keep this area open. 07/10/2020 upon evaluation today patient unfortunately appears to be having increased pain yet again and is not doing nearly as well as she was previous. I do believe this is indication of infection. The patient is going to require a culture today it appears. 07/18/2020 upon evaluation today patient appears to be doing well with regard to her wounds. She has been tolerating the dressing changes without complication. In fact this appears to be doing significantly better compared to last week. I did review her however her pathology which showed acute osteomyelitis. Also did review the culture which showed Staph aureus. She is on Bactrim that  does seem to be helping. I am unlikely going to continue that for a total of 2 months. I think they would prefer that over going to infectious disease. I am definitely okay with that as far as that is concerned. I am going to likely go ahead and refill the Bactrim next week for another 2 weeks supply in order to get the patient through for this month and then will have to do 1 more month for a full 8 weeks of treatment. Several of the times that I sent patients to infectious disease they just kept the patient on oral antibiotic therapy as opposed to switching IV anyway and she seems to be doing very well after just 1 week. 07/25/2020 upon evaluation today patient appears to be doing well with regard to her wound. She still has an opening and there is still some depth there as well. With that being said I do think that the patient does have osteomyelitis and very well could be a candidate for hyperbaric oxygen therapy. However I [name] see how she does over the next 2 weeks with the oral antibiotics. Obviously if she is not close by that time I think proceeding with the HBO therapy would be appropriate. I  did discuss this with the patient and her daughter today. I think that this may be something to strongly consider going forward if things do not improve over again the next 2 weeks. Objective Constitutional Well-nourished and well-hydrated in no acute distress. [name], [name] ([id number]) Vitals Time Taken: 2:24 PM, Height: 62 in, Weight: 140 lbs, BMI: 25.6, Temperature: 98.3 F, Pulse: 58 bpm, Respiratory Rate: 20 breaths/min, Blood Pressure: 155/70 mmHg. Respiratory normal breathing without difficulty. Psychiatric this patient is able to make decisions and demonstrates good insight into disease process. Alert and Oriented x 3. pleasant and cooperative. General Notes: Upon inspection patient's wound did require sharp debridement to remove some of the necrotic debris and callus around the  edges of the wound. I did this as carefully as possible today. She tolerated that without complication. With that being said I am going to continue to monitor and see how things go as far as the overall healing is concerned. I believe that the patient likely may proceed to the need for hyperbaric oxygen therapy but again she may be healed as well when I see her in a couple weeks. The time between now and 2 weeks from now when I see her we will make that determination. Integumentary (Hair, Skin) Wound #3 status is Open. Original cause of wound was Gradually Appeared. The date acquired was: 04/09/2019. The wound has been in treatment 41 weeks. The wound is located on the Right Metatarsal head fifth. The wound measures 0.1cm length x 0.1cm width x 0.2cm depth; 0.008cm^2 area and 0.002cm^3 volume. There is Fat Layer (Subcutaneous Tissue) exposed. There is no tunneling or undermining noted. There is a small amount of serosanguineous drainage noted. The wound margin is distinct with the outline attached to the wound base. There is large (67-100%) red, pink granulation within the wound bed. There is no necrotic tissue within the wound bed. Assessment Active Problems ICD-10 Other specified peripheral vascular diseases Non-pressure chronic ulcer of other part of right foot with fat layer exposed Other chronic osteomyelitis, right ankle and foot Long term (current) use of anticoagulants Paroxysmal atrial fibrillation Tinea pedis Procedures Wound #3 Pre-procedure diagnosis of Wound #3 is an Arterial Insufficiency Ulcer located on the Right Metatarsal head fifth .Severity of Tissue Pre Debridement is: Fat layer exposed. There was a Excisional Skin/Subcutaneous Tissue Debridement with a total area of 0.04 sq cm performed by Tommie Sams., PA-C. With the following instrument(s): Curette to remove Viable and Non-Viable tissue/material. Material removed includes Callus, Subcutaneous Tissue, and Slough after  achieving pain control using Lidocaine 4% Topical Solution. A time out was conducted at 14:31, prior to the start of the procedure. A Minimum amount of bleeding was controlled with Pressure. The procedure was tolerated well. Post Debridement Measurements: 0.1cm length x 0.1cm width x 0.2cm depth; 0.002cm^3 volume. Character of Wound/Ulcer Post Debridement is stable. Severity of Tissue Post Debridement is: Fat layer exposed. Post procedure Diagnosis Wound #3: Same as Pre-Procedure Plan Follow-up Appointments: Return Appointment in 2 weeks. Home Health: [facility]: - [facility] for wound care. May utilize formulary equivalent dressing for wound treatment orders unless otherwise specified. Home Health Nurse may visit PR[facility] to address patient s wound care needs. Scheduled days for dressing changes to be completed; exception, patient has scheduled wound care visit that day. **Please direct any NON-WOUND related issues/requests for orders to patient's Primary Care Physician. **If current dressing causes regression in wound condition, may D/C ordered dressing product/s and apply Normal Saline  Moist Dressing daily until next [place] or Other MD appointment. **Notify [facility] of regression in wound condition at [phone]. Medications-Please add to medication list.: P.O. Antibiotics - Doxycycline x 2 weeks SCOUT, GUYETT. ([id number]) The following medication(s) was prescribed: doxycycline hyclate oral 100 mg capsule 1 1 capsule oral taken 2 times per day for 15 days starting 07/25/2020 WOUND #3: - Metatarsal head fifth Wound Laterality: Right Cleanser: Normal Saline (Generic) 2 x Per Week/30 Days Discharge Instructions: Wash your hands with soap and water. Remove old dressing, discard into plastic bag and place into trash. Cleanse the wound with Normal Saline prior to applying a clean dressing using gauze sponges, not tissues or cotton balls. Do  not scrub or use excessive force. Pat dry using gauze sponges, not tissue or cotton balls. Primary Dressing: Hydrofera Blue Classic Foam Rope Dressing, 9x6 (mm/in) 2 x Per Week/30 Days Discharge Instructions: Cut in half, about 2 cm length-pack in wound-put in a couple drops of saline (extras sent with patient) Secondary Dressing: Mepilex Border Flex, 4x4 (in/in) (Generic) 2 x Per Week/30 Days Discharge Instructions: Apply to wound as directed. Do not cut. 1. Would recommend currently that we going continue with the wound care measures as before this includes the Hydrofera Blue rope. 2. Also can recommend a border foam dressing to cover. 3. I am also going to suggest that the patient monitor for any signs of worsening and continue with her antibiotics I did send in a refill for the doxycycline today. We will see how things appear in 2 week's time when I see her back. We will see patient back for reevaluation in 2 weeks here in the clinic. If anything worsens or changes patient will contact our office for additional recommendations. Electronic Signature(s) Signed: 07/25/2020 5:59:29 PM By: Worthy Keeler PA-C Previous Signature: 07/25/2020 5:59:19 PM Version By: Worthy Keeler PA-C Entered By: Worthy Keeler on 07/25/2020 17:59:29 APPOLLONIA, KLEE ([id number]) -------------------------------------------------------------------------------- SuperBill Details Patient Name: [name] Date of Service: 07/25/2020 Medical Record Number: [id number] Patient Account Number: [id number] Date of Birth/Sex: [birth year] (87 y.o. F) Treating RN: Dolan Amen Primary Care Provider: Ria Bush Other Clinician: Jeanine Luz Referring Provider: Ria Bush Treating Provider/Extender: Skipper Cliche in Treatment: 41 Diagnosis Coding ICD-10 Codes Code Description I73.89 Other specified peripheral vascular diseases L97.512 Non-pressure chronic ulcer of other part of right foot  with fat layer exposed M86.671 Other chronic osteomyelitis, right ankle and foot Z79.01 Long term (current) use of anticoagulants I48.0 Paroxysmal atrial fibrillation B35.3 Tinea pedis Facility Procedures CPT4 Code: 53299242 Description: 68341 - DEB SUBQ TISSUE 20 SQ CM/< Modifier: Quantity: 1 CPT4 Code: Description: ICD-10 Diagnosis Description L97.512 Non-pressure chronic ulcer of other part of right foot with fat layer ex Modifier: posed Quantity: Physician Procedures CPT4 Code: 9622297 Description: 99214 - WC PHYS LEVEL 4 - EST PT Modifier: 25 Quantity: 1 CPT4 Code: Description: ICD-10 Diagnosis Description I73.89 Other specified peripheral vascular diseases L97.512 Non-pressure chronic ulcer of other part of right foot with fat layer ex M86.671 Other chronic osteomyelitis, right ankle and foot Z79.01 Long term  (current) use of anticoagulants Modifier: posed Quantity: CPT4 Code: 9892119 Description: 11042 - WC PHYS SUBQ TISS 20 SQ CM Modifier: Quantity: 1 CPT4 Code: Description: ICD-10 Diagnosis Description L97.512 Non-pressure chronic ulcer of other part of right foot with fat layer ex Modifier: posed Quantity: Electronic Signature(s) Signed: 07/25/2020 6:00:08 PM By: Worthy Keeler PA-C Previous Signature: 07/25/2020 3:17:55 PM Version By: Joaquim Lai  III, Buck Mcaffee PA-C Entered By: Worthy Keeler on 07/25/2020 18:00:08

## 2020-07-26 DIAGNOSIS — Z792 Long term (current) use of antibiotics: Secondary | ICD-10-CM | POA: Diagnosis not present

## 2020-07-26 DIAGNOSIS — E559 Vitamin D deficiency, unspecified: Secondary | ICD-10-CM | POA: Diagnosis not present

## 2020-07-26 DIAGNOSIS — L97513 Non-pressure chronic ulcer of other part of right foot with necrosis of muscle: Secondary | ICD-10-CM | POA: Diagnosis not present

## 2020-07-26 DIAGNOSIS — M5136 Other intervertebral disc degeneration, lumbar region: Secondary | ICD-10-CM | POA: Diagnosis not present

## 2020-07-26 DIAGNOSIS — M810 Age-related osteoporosis without current pathological fracture: Secondary | ICD-10-CM | POA: Diagnosis not present

## 2020-07-26 DIAGNOSIS — I130 Hypertensive heart and chronic kidney disease with heart failure and stage 1 through stage 4 chronic kidney disease, or unspecified chronic kidney disease: Secondary | ICD-10-CM | POA: Diagnosis not present

## 2020-07-26 DIAGNOSIS — G9009 Other idiopathic peripheral autonomic neuropathy: Secondary | ICD-10-CM | POA: Diagnosis not present

## 2020-07-26 DIAGNOSIS — K219 Gastro-esophageal reflux disease without esophagitis: Secondary | ICD-10-CM | POA: Diagnosis not present

## 2020-07-26 DIAGNOSIS — S62102D Fracture of unspecified carpal bone, left wrist, subsequent encounter for fracture with routine healing: Secondary | ICD-10-CM | POA: Diagnosis not present

## 2020-07-26 DIAGNOSIS — Z9181 History of falling: Secondary | ICD-10-CM | POA: Diagnosis not present

## 2020-07-26 DIAGNOSIS — I872 Venous insufficiency (chronic) (peripheral): Secondary | ICD-10-CM | POA: Diagnosis not present

## 2020-07-26 DIAGNOSIS — G2581 Restless legs syndrome: Secondary | ICD-10-CM | POA: Diagnosis not present

## 2020-07-26 DIAGNOSIS — M1990 Unspecified osteoarthritis, unspecified site: Secondary | ICD-10-CM | POA: Diagnosis not present

## 2020-07-26 DIAGNOSIS — I4891 Unspecified atrial fibrillation: Secondary | ICD-10-CM | POA: Diagnosis not present

## 2020-07-26 DIAGNOSIS — I272 Pulmonary hypertension, unspecified: Secondary | ICD-10-CM | POA: Diagnosis not present

## 2020-07-26 DIAGNOSIS — Z7982 Long term (current) use of aspirin: Secondary | ICD-10-CM | POA: Diagnosis not present

## 2020-07-26 DIAGNOSIS — E7849 Other hyperlipidemia: Secondary | ICD-10-CM | POA: Diagnosis not present

## 2020-07-26 DIAGNOSIS — I2510 Atherosclerotic heart disease of native coronary artery without angina pectoris: Secondary | ICD-10-CM | POA: Diagnosis not present

## 2020-07-26 DIAGNOSIS — N1831 Chronic kidney disease, stage 3a: Secondary | ICD-10-CM | POA: Diagnosis not present

## 2020-07-26 DIAGNOSIS — E538 Deficiency of other specified B group vitamins: Secondary | ICD-10-CM | POA: Diagnosis not present

## 2020-07-26 DIAGNOSIS — I5032 Chronic diastolic (congestive) heart failure: Secondary | ICD-10-CM | POA: Diagnosis not present

## 2020-07-26 DIAGNOSIS — Z7901 Long term (current) use of anticoagulants: Secondary | ICD-10-CM | POA: Diagnosis not present

## 2020-07-27 DIAGNOSIS — I2510 Atherosclerotic heart disease of native coronary artery without angina pectoris: Secondary | ICD-10-CM | POA: Diagnosis not present

## 2020-07-27 DIAGNOSIS — G2581 Restless legs syndrome: Secondary | ICD-10-CM | POA: Diagnosis not present

## 2020-07-27 DIAGNOSIS — E538 Deficiency of other specified B group vitamins: Secondary | ICD-10-CM | POA: Diagnosis not present

## 2020-07-27 DIAGNOSIS — M1990 Unspecified osteoarthritis, unspecified site: Secondary | ICD-10-CM | POA: Diagnosis not present

## 2020-07-27 DIAGNOSIS — N1831 Chronic kidney disease, stage 3a: Secondary | ICD-10-CM | POA: Diagnosis not present

## 2020-07-27 DIAGNOSIS — M5136 Other intervertebral disc degeneration, lumbar region: Secondary | ICD-10-CM | POA: Diagnosis not present

## 2020-07-27 DIAGNOSIS — I272 Pulmonary hypertension, unspecified: Secondary | ICD-10-CM | POA: Diagnosis not present

## 2020-07-27 DIAGNOSIS — L97513 Non-pressure chronic ulcer of other part of right foot with necrosis of muscle: Secondary | ICD-10-CM | POA: Diagnosis not present

## 2020-07-27 DIAGNOSIS — S62102D Fracture of unspecified carpal bone, left wrist, subsequent encounter for fracture with routine healing: Secondary | ICD-10-CM | POA: Diagnosis not present

## 2020-07-27 DIAGNOSIS — E7849 Other hyperlipidemia: Secondary | ICD-10-CM | POA: Diagnosis not present

## 2020-07-27 DIAGNOSIS — I4891 Unspecified atrial fibrillation: Secondary | ICD-10-CM | POA: Diagnosis not present

## 2020-07-27 DIAGNOSIS — G9009 Other idiopathic peripheral autonomic neuropathy: Secondary | ICD-10-CM | POA: Diagnosis not present

## 2020-07-27 DIAGNOSIS — Z9181 History of falling: Secondary | ICD-10-CM | POA: Diagnosis not present

## 2020-07-27 DIAGNOSIS — M810 Age-related osteoporosis without current pathological fracture: Secondary | ICD-10-CM | POA: Diagnosis not present

## 2020-07-27 DIAGNOSIS — E559 Vitamin D deficiency, unspecified: Secondary | ICD-10-CM | POA: Diagnosis not present

## 2020-07-27 DIAGNOSIS — Z7982 Long term (current) use of aspirin: Secondary | ICD-10-CM | POA: Diagnosis not present

## 2020-07-27 DIAGNOSIS — Z792 Long term (current) use of antibiotics: Secondary | ICD-10-CM | POA: Diagnosis not present

## 2020-07-27 DIAGNOSIS — Z7901 Long term (current) use of anticoagulants: Secondary | ICD-10-CM | POA: Diagnosis not present

## 2020-07-27 DIAGNOSIS — I872 Venous insufficiency (chronic) (peripheral): Secondary | ICD-10-CM | POA: Diagnosis not present

## 2020-07-27 DIAGNOSIS — I130 Hypertensive heart and chronic kidney disease with heart failure and stage 1 through stage 4 chronic kidney disease, or unspecified chronic kidney disease: Secondary | ICD-10-CM | POA: Diagnosis not present

## 2020-07-27 DIAGNOSIS — I5032 Chronic diastolic (congestive) heart failure: Secondary | ICD-10-CM | POA: Diagnosis not present

## 2020-07-27 DIAGNOSIS — K219 Gastro-esophageal reflux disease without esophagitis: Secondary | ICD-10-CM | POA: Diagnosis not present

## 2020-07-31 ENCOUNTER — Telehealth: Payer: Self-pay

## 2020-07-31 ENCOUNTER — Telehealth: Payer: Self-pay | Admitting: Internal Medicine

## 2020-07-31 DIAGNOSIS — K219 Gastro-esophageal reflux disease without esophagitis: Secondary | ICD-10-CM | POA: Diagnosis not present

## 2020-07-31 DIAGNOSIS — Z7982 Long term (current) use of aspirin: Secondary | ICD-10-CM | POA: Diagnosis not present

## 2020-07-31 DIAGNOSIS — Z7901 Long term (current) use of anticoagulants: Secondary | ICD-10-CM | POA: Diagnosis not present

## 2020-07-31 DIAGNOSIS — I872 Venous insufficiency (chronic) (peripheral): Secondary | ICD-10-CM | POA: Diagnosis not present

## 2020-07-31 DIAGNOSIS — M810 Age-related osteoporosis without current pathological fracture: Secondary | ICD-10-CM | POA: Diagnosis not present

## 2020-07-31 DIAGNOSIS — M5136 Other intervertebral disc degeneration, lumbar region: Secondary | ICD-10-CM | POA: Diagnosis not present

## 2020-07-31 DIAGNOSIS — I5032 Chronic diastolic (congestive) heart failure: Secondary | ICD-10-CM | POA: Diagnosis not present

## 2020-07-31 DIAGNOSIS — G2581 Restless legs syndrome: Secondary | ICD-10-CM | POA: Diagnosis not present

## 2020-07-31 DIAGNOSIS — E538 Deficiency of other specified B group vitamins: Secondary | ICD-10-CM | POA: Diagnosis not present

## 2020-07-31 DIAGNOSIS — S62102D Fracture of unspecified carpal bone, left wrist, subsequent encounter for fracture with routine healing: Secondary | ICD-10-CM | POA: Diagnosis not present

## 2020-07-31 DIAGNOSIS — L97513 Non-pressure chronic ulcer of other part of right foot with necrosis of muscle: Secondary | ICD-10-CM | POA: Diagnosis not present

## 2020-07-31 DIAGNOSIS — G9009 Other idiopathic peripheral autonomic neuropathy: Secondary | ICD-10-CM | POA: Diagnosis not present

## 2020-07-31 DIAGNOSIS — E559 Vitamin D deficiency, unspecified: Secondary | ICD-10-CM | POA: Diagnosis not present

## 2020-07-31 DIAGNOSIS — N1831 Chronic kidney disease, stage 3a: Secondary | ICD-10-CM | POA: Diagnosis not present

## 2020-07-31 DIAGNOSIS — Z792 Long term (current) use of antibiotics: Secondary | ICD-10-CM | POA: Diagnosis not present

## 2020-07-31 DIAGNOSIS — E7849 Other hyperlipidemia: Secondary | ICD-10-CM | POA: Diagnosis not present

## 2020-07-31 DIAGNOSIS — I2510 Atherosclerotic heart disease of native coronary artery without angina pectoris: Secondary | ICD-10-CM | POA: Diagnosis not present

## 2020-07-31 DIAGNOSIS — I130 Hypertensive heart and chronic kidney disease with heart failure and stage 1 through stage 4 chronic kidney disease, or unspecified chronic kidney disease: Secondary | ICD-10-CM | POA: Diagnosis not present

## 2020-07-31 DIAGNOSIS — I272 Pulmonary hypertension, unspecified: Secondary | ICD-10-CM | POA: Diagnosis not present

## 2020-07-31 DIAGNOSIS — M1990 Unspecified osteoarthritis, unspecified site: Secondary | ICD-10-CM | POA: Diagnosis not present

## 2020-07-31 DIAGNOSIS — Z9181 History of falling: Secondary | ICD-10-CM | POA: Diagnosis not present

## 2020-07-31 DIAGNOSIS — I4891 Unspecified atrial fibrillation: Secondary | ICD-10-CM | POA: Diagnosis not present

## 2020-07-31 MED ORDER — GABAPENTIN 100 MG PO CAPS: ORAL_CAPSULE | ORAL | 0 refills | Status: DC

## 2020-07-31 NOTE — Telephone Encounter (Signed)
Patients daughter called and stated that for the past three days she has experienced worsening bilateral leg pain D/T peripheral neuropathy. Patient is having difficulty walking because of this and daughter is afraid that a fall will occur. Patients daughter is wanting to know what can be done to help with this? Patients daughter stated that she is taking Gabapentin, which was cut back from 300 mg TID to 300 mg daily as well as Tramadol. Please advise.

## 2020-07-31 NOTE — Telephone Encounter (Signed)
Received a call from Ms [name] daughter - [name].  She has been having increased leg pain for the past three nights.  Daughter is concerned and would like something more to help her get through the night.  She currently takes tylenol.  Also takes one tramadol bid and gabapentin 300mg  q hs.  In reviewing the chart, she previously was on gabapentin 300mg  tid.  Per review, it appears this caused dizziness and unsteadiness.  Discussed treatment options.  Daughter would like to double up gabapentin at night.  Discussed my concern going from 300mg  q hs to 600mg  q hs (regarding side effects given her previous intolerance).  Has tried multiple medications.  Also taking requip for restless legs.  After discussion, it was decided to send in gabapentin 100mg  - to add to 300mg  q hs prn.  Daughter agreeable.  Discussed possible side effects.  rx sent in for gabapentin 100mg  q hs prn #30 with no refills.

## 2020-08-01 NOTE — Telephone Encounter (Signed)
See Dr Bary Leriche note. Trying gabapentin 400mg  [facility].

## 2020-08-01 NOTE — Telephone Encounter (Signed)
[facility] - Client TELEPHONE ADVICE RECORD [facility] Patient Name: [name] Gender: Female DOB: [birth year] Age: 87 Y 1 M 29 D Return Phone Number: [phone] (Primary) Address: City/ State/ Zip: [place] [address] Client [facility] - Client Client Site [facility] Physician Ria Bush - MD Contact Type Call Who Is Calling Patient / Member / Family / Caregiver Call Type Triage / Clinical Caller Name [name] Relationship To Patient Daughter Return Phone Number [phone] (Primary) Chief Complaint Leg Pain Reason for Call Symptomatic / Request for [place] states she called earlier today about her mother's pain medication for her legs. She is following up on that call since she has not heard anything. Translation No Nurse Assessment Nurse: Jerene Bears, RN, Will Date/Time Eilene Ghazi Time): 07/31/2020 5:07:03 PM Confirm and document reason for call. If symptomatic, describe symptoms. ---Caller reports that she called the office today regarding her mother's medications. Hx of peripheral neuropathy and having increasing pain over the past three days. Patient takes gabapentin 300mg  Q day and caller wants to know if her mother can take more today for the pain. Does the patient have any new or worsening symptoms? ---Yes Will a triage be completed? ---Yes Related visit to physician within the last 2 weeks? ---No Does the PT have any chronic conditions? (i.e. diabetes, asthma, this includes High risk factors for pregnancy, etc.) ---Yes List chronic conditions. ---neuropathy, afib, htn, Is this a behavioral health or substance abuse call? ---No Guidelines Guideline Title Affirmed Question Affirmed Notes Nurse Date/Time Eilene Ghazi Time) Leg Pain [1] MODERATE pain (e.g., interferes with normal activities, limping) Jerene Bears,  RN, Will 07/31/2020 5:11:09 PM PLEASE NOTE: All timestamps contained within this report are represented as [place] Standard Time. CONFIDENTIALTY NOTICE: This fax transmission is intended only for the addressee. It contains information that is legally privileged, confidential or otherwise protected from use or disclosure. If you are not the intended recipient, you are strictly prohibited from reviewing, disclosing, copying using or disseminating any of this information or taking any action in reliance on or regarding this information. If you have received this fax in error, please notify us immediately by telephone so that we can arrange for its return to [place]. Phone: [phone], Toll-Free: [phone], Fax: [phone] Page: 2 of 2 Call Id: [id number] Guidelines Guideline Title Affirmed Question Affirmed Notes Nurse Date/Time Eilene Ghazi Time) AND [2] present > 3 days Disp. Time Eilene Ghazi Time) Disposition Final User 07/31/2020 5:21:40 PM Called On-Call Provider [place], RN, Will 07/31/2020 5:15:45 PM SEE PCP WITHIN 3 DAYS Yes Jerene Bears, RN, Will Caller Disagree/Comply Comply Caller Understands Yes PreDisposition Call Doctor Care Advice Given Per Guideline SEE PCP WITHIN 3 DAYS: * You need to be seen within 2 or 3 days. CALL BACK IF: * Signs of infection occur (e.g., spreading redness, warmth, fever) * You become worse CARE ADVICE given per Leg Pain (Adult) guideline. Comments User: Janeece Agee, RN Date/Time Eilene Ghazi Time): 07/31/2020 5:12:21 PM Patient followed by wound care for chronic wound on foot, taking doxycycline currently. User: Janeece Agee, RN Date/Time Eilene Ghazi Time): 07/31/2020 5:13:21 PM burning pain from tips of toes to knees bilaterally. User: Janeece Agee, RN Date/Time Eilene Ghazi Time): 07/31/2020 5:24:01 PM caller advised to expect call from on call physician, understanding verbalized by caller. Referrals REFERRED TO PCP OFFICE Paging DoctorName Phone  DateTime Result/ Outcome Message Type Notes Einar Pheasant - MD [phone] 07/31/2020 5:21:40 PM Called On Call Provider -  Reached Doctor Paged Einar Pheasant - MD 07/31/2020 5:22:10 PM Spoke with On Call - General Message Result Provider will review chart and contact caller directly, primary phone number provided to MD.

## 2020-08-01 NOTE — Telephone Encounter (Signed)
Noted. Thank you.  I was out of office yesterday and didn't get message.  Trying gabapentin 400mg  QHS.  plz call later this week for an update.

## 2020-08-01 NOTE — Telephone Encounter (Signed)
Noted  

## 2020-08-01 NOTE — Telephone Encounter (Signed)
See Dr Jackelyn Hoehn note below the access nurse.

## 2020-08-02 DIAGNOSIS — I4891 Unspecified atrial fibrillation: Secondary | ICD-10-CM | POA: Diagnosis not present

## 2020-08-02 DIAGNOSIS — E559 Vitamin D deficiency, unspecified: Secondary | ICD-10-CM | POA: Diagnosis not present

## 2020-08-02 DIAGNOSIS — I272 Pulmonary hypertension, unspecified: Secondary | ICD-10-CM | POA: Diagnosis not present

## 2020-08-02 DIAGNOSIS — M810 Age-related osteoporosis without current pathological fracture: Secondary | ICD-10-CM | POA: Diagnosis not present

## 2020-08-02 DIAGNOSIS — I130 Hypertensive heart and chronic kidney disease with heart failure and stage 1 through stage 4 chronic kidney disease, or unspecified chronic kidney disease: Secondary | ICD-10-CM | POA: Diagnosis not present

## 2020-08-02 DIAGNOSIS — I2510 Atherosclerotic heart disease of native coronary artery without angina pectoris: Secondary | ICD-10-CM | POA: Diagnosis not present

## 2020-08-02 DIAGNOSIS — M5136 Other intervertebral disc degeneration, lumbar region: Secondary | ICD-10-CM | POA: Diagnosis not present

## 2020-08-02 DIAGNOSIS — E538 Deficiency of other specified B group vitamins: Secondary | ICD-10-CM | POA: Diagnosis not present

## 2020-08-02 DIAGNOSIS — G9009 Other idiopathic peripheral autonomic neuropathy: Secondary | ICD-10-CM | POA: Diagnosis not present

## 2020-08-02 DIAGNOSIS — M1990 Unspecified osteoarthritis, unspecified site: Secondary | ICD-10-CM | POA: Diagnosis not present

## 2020-08-02 DIAGNOSIS — N1831 Chronic kidney disease, stage 3a: Secondary | ICD-10-CM | POA: Diagnosis not present

## 2020-08-02 DIAGNOSIS — K219 Gastro-esophageal reflux disease without esophagitis: Secondary | ICD-10-CM | POA: Diagnosis not present

## 2020-08-02 DIAGNOSIS — I872 Venous insufficiency (chronic) (peripheral): Secondary | ICD-10-CM | POA: Diagnosis not present

## 2020-08-02 DIAGNOSIS — E7849 Other hyperlipidemia: Secondary | ICD-10-CM | POA: Diagnosis not present

## 2020-08-02 DIAGNOSIS — Z792 Long term (current) use of antibiotics: Secondary | ICD-10-CM | POA: Diagnosis not present

## 2020-08-02 DIAGNOSIS — Z7901 Long term (current) use of anticoagulants: Secondary | ICD-10-CM | POA: Diagnosis not present

## 2020-08-02 DIAGNOSIS — Z9181 History of falling: Secondary | ICD-10-CM | POA: Diagnosis not present

## 2020-08-02 DIAGNOSIS — Z7982 Long term (current) use of aspirin: Secondary | ICD-10-CM | POA: Diagnosis not present

## 2020-08-02 DIAGNOSIS — S62102D Fracture of unspecified carpal bone, left wrist, subsequent encounter for fracture with routine healing: Secondary | ICD-10-CM | POA: Diagnosis not present

## 2020-08-02 DIAGNOSIS — L97513 Non-pressure chronic ulcer of other part of right foot with necrosis of muscle: Secondary | ICD-10-CM | POA: Diagnosis not present

## 2020-08-02 DIAGNOSIS — G2581 Restless legs syndrome: Secondary | ICD-10-CM | POA: Diagnosis not present

## 2020-08-02 DIAGNOSIS — I5032 Chronic diastolic (congestive) heart failure: Secondary | ICD-10-CM | POA: Diagnosis not present

## 2020-08-02 NOTE — Progress Notes (Signed)
[name] ([id number]) Visit Report for 07/25/2020 Arrival Information Details Patient Name: [name], [name]. Date of Service: 07/25/2020 2:00 PM Medical Record Number: [id number] Patient Account Number: [id number] Date of Birth/Sex: [birth year] (87 y.o. F) Treating RN: Carlene Coria Primary Care Mark Hassey: Ria Bush Other Clinician: Jeanine Luz Referring Alissandra Geoffroy: Ria Bush Treating Arber Wiemers/Extender: Skipper Cliche in Treatment: 82 Visit Information History Since Last Visit All ordered tests and consults were completed: No Patient Arrived: Gilford Rile Added or deleted any medications: No Arrival Time: 14:12 Any new allergies or adverse reactions: No Accompanied By: daughter Had a fall or experienced change in No Transfer Assistance: None activities of daily living that may affect Patient Identification Verified: Yes risk of falls: Secondary Verification Process Completed: Yes Signs or symptoms of abuse/neglect since last visito No Patient Requires Transmission-Based No Hospitalized since last visit: No Precautions: Implantable device outside of the clinic excluding No Patient Has Alerts: Yes cellular tissue based products placed in the center Patient Alerts: 08/31/19 ABI R)1.3 L) since last visit: 1.31 Has Dressing in Place as Prescribed: Yes 08/31/19 TBI R).61 Pain Present Now: No L).58 Electronic Signature(s) Signed: 08/02/2020 3:51:10 PM By: Carlene Coria RN Entered By: Carlene Coria on 07/25/2020 14:24:42 [name] ([id number]) -------------------------------------------------------------------------------- Clinic Level of Care Assessment Details Patient Name: [name]. Date of Service: 07/25/2020 2:00 PM Medical Record Number: [id number] Patient Account Number: [id number] Date of Birth/Sex: November 12, 1929 (87 y.o. F) Treating RN: Dolan Amen Primary Care Mariadelaluz Guggenheim: Ria Bush Other Clinician: Jeanine Luz Referring  Khristen Cheyney: Ria Bush Treating Taela Charbonneau/Extender: Skipper Cliche in Treatment: 41 Clinic Level of Care Assessment Items TOOL 1 Quantity Score []  - Use when EandM and Procedure is performed on INITIAL visit 0 ASSESSMENTS - Nursing Assessment / Reassessment []  - General Physical Exam (combine w/ comprehensive assessment (listed just below) when performed on new 0 pt. evals) []  - 0 Comprehensive Assessment (HX, ROS, Risk Assessments, Wounds Hx, etc.) ASSESSMENTS - Wound and Skin Assessment / Reassessment []  - Dermatologic / Skin Assessment (not related to wound area) 0 ASSESSMENTS - Ostomy and/or Continence Assessment and Care []  - Incontinence Assessment and Management 0 []  - 0 Ostomy Care Assessment and Management (repouching, etc.) PROCESS - Coordination of Care []  - Simple Patient / Family Education for ongoing care 0 []  - 0 Complex (extensive) Patient / Family Education for ongoing care []  - 0 Staff obtains Programmer, systems, Records, Test Results / Process Orders []  - 0 Staff telephones HHA, Nursing Homes / Clarify orders / etc []  - 0 Routine Transfer to another Facility (non-emergent condition) []  - 0 Routine Hospital Admission (non-emergent condition) []  - 0 New Admissions / Biomedical engineer / Ordering NPWT, Apligraf, etc. []  - 0 Emergency Hospital Admission (emergent condition) PROCESS - Special Needs []  - Pediatric / Minor Patient Management 0 []  - 0 Isolation Patient Management []  - 0 Hearing / Language / Visual special needs []  - 0 Assessment of Community assistance (transportation, D/C planning, etc.) []  - 0 Additional assistance / Altered mentation []  - 0 Support Surface(s) Assessment (bed, cushion, seat, etc.) INTERVENTIONS - Miscellaneous []  - External ear exam 0 []  - 0 Patient Transfer (multiple staff / Civil Service fast streamer / Similar devices) []  - 0 Simple Staple / Suture removal (25 or less) []  - 0 Complex Staple / Suture removal (26 or more) []  -  0 Hypo/Hyperglycemic Management (do not check if billed separately) []  - 0 Ankle / Brachial Index (ABI) - do not check if billed separately Has the patient  been seen at the hospital within the last three years: Yes Total Score: 0 Level Of Care: ____ [name] ([id number]) Electronic Signature(s) Signed: 07/25/2020 5:05:23 PM By: Georges Mouse, Minus Breeding RN Entered By: Georges Mouse, Minus Breeding on 07/25/2020 14:37:46 [name] ([id number]) -------------------------------------------------------------------------------- Encounter Discharge Information Details Patient Name: [name]. Date of Service: 07/25/2020 2:00 PM Medical Record Number: [id number] Patient Account Number: [id number] Date of Birth/Sex: 16-Oct-1929 (87 y.o. F) Treating RN: Dolan Amen Primary Care Xin Klawitter: Ria Bush Other Clinician: Jeanine Luz Referring Tevyn Codd: Ria Bush Treating Solan Vosler/Extender: Skipper Cliche in Treatment: 72 Encounter Discharge Information Items Post Procedure Vitals Discharge Condition: Stable Temperature (F): 98.3 Ambulatory Status: Walker Pulse (bpm): 58 Discharge Destination: Home Respiratory Rate (breaths/min): 20 Transportation: Private Auto Blood Pressure (mmHg): 155/70 Accompanied By: daughter Schedule Follow-up Appointment: Yes Clinical Summary of Care: Electronic Signature(s) Signed: 07/25/2020 4:09:29 PM By: Jeanine Luz Entered By: Jeanine Luz on 07/25/2020 15:02:44 [name] ([id number]) -------------------------------------------------------------------------------- Lower Extremity Assessment Details Patient Name: [name]. Date of Service: 07/25/2020 2:00 PM Medical Record Number: [id number] Patient Account Number: [id number] Date of Birth/Sex: 12-28-1929 (87 y.o. F) Treating RN: Carlene Coria Primary Care Iran Rowe: Ria Bush Other Clinician: Jeanine Luz Referring Talyn Eddie: Ria Bush Treating Tarig Zimmers/Extender: Jeri Cos Weeks in Treatment: [phone] Vascular Assessment Pulses: Dorsalis Pedis Palpable: [Right:Yes] Electronic Signature(s) Signed: 08/02/2020 3:51:10 PM By: Carlene Coria RN Entered By: Carlene Coria on 07/25/2020 14:26:38 [name] ([id number]) -------------------------------------------------------------------------------- Multi Wound Chart Details Patient Name: [name] Date of Service: 07/25/2020 2:00 PM Medical Record Number: [id number] Patient Account Number: [id number] Date of Birth/Sex: 1929-12-26 (87 y.o. F) Treating RN: Dolan Amen Primary Care Aerabella Galasso: Ria Bush Other Clinician: Jeanine Luz Referring Maila Dukes: Ria Bush Treating Shulem Mader/Extender: Skipper Cliche in Treatment: 40 Vital Signs Height(in): 62 Pulse(bpm): 58 Weight(lbs): 140 Blood Pressure(mmHg): 155/70 Body Mass Index(BMI): 26 Temperature(F): 98.3 Respiratory Rate(breaths/min): 20 Photos: [N/A:N/A] Wound Location: Right Metatarsal head fifth N/A N/A Wounding Event: Gradually Appeared N/A N/A Primary Etiology: Arterial Insufficiency Ulcer N/A N/A Comorbid History: Arrhythmia, Osteoarthritis N/A N/A Date Acquired: 04/09/2019 N/A N/A Weeks of Treatment: 41 N/A N/A Wound Status: Open N/A N/A Measurements L x W x D (cm) 0.1x0.1x0.2 N/A N/A Area (cm) : 0.008 N/A N/A Volume (cm) : 0.002 N/A N/A % Reduction in Area: 99.30% N/A N/A % Reduction in Volume: 98.20% N/A N/A Classification: Full Thickness Without Exposed N/A N/A Support Structures Exudate Amount: Small N/A N/A Exudate Type: Serosanguineous N/A N/A Exudate Color: red, brown N/A N/A Wound Margin: Distinct, outline attached N/A N/A Granulation Amount: Large (67-100%) N/A N/A Granulation Quality: Red, Pink N/A N/A Necrotic Amount: None Present (0%) N/A N/A Exposed Structures: Fat Layer (Subcutaneous Tissue): N/A N/A Yes Fascia: No Tendon: No Muscle: No Joint:  No Bone: No Epithelialization: Small (1-33%) N/A N/A Treatment Notes Electronic Signature(s) Signed: 07/25/2020 5:05:23 PM By: Georges Mouse, Minus Breeding RN Entered By: Georges Mouse, Minus Breeding on 07/25/2020 14:30:35 [name] ([id number]) -------------------------------------------------------------------------------- [place] Details Patient Name: [name]. Date of Service: 07/25/2020 2:00 PM Medical Record Number: [id number] Patient Account Number: [id number] Date of Birth/Sex: June 08, 1929 (87 y.o. F) Treating RN: Dolan Amen Primary Care Mysha Peeler: Ria Bush Other Clinician: Jeanine Luz Referring Stellan Vick: Ria Bush Treating Khani Paino/Extender: Jeri Cos Weeks in Treatment: 37 Active Inactive Electronic Signature(s) Signed: 07/25/2020 5:05:23 PM By: Georges Mouse, Minus Breeding RN Entered By: Georges Mouse, Minus Breeding on 07/25/2020 14:30:26 [name] ([id number]) -------------------------------------------------------------------------------- Pain Assessment Details Patient Name: [name] Date of  Service: 07/25/2020 2:00 PM Medical Record Number: [id number] Patient Account Number: [id number] Date of Birth/Sex: [birth year] (87 y.o. F) Treating RN: Carlene Coria Primary Care Tyric Rodeheaver: Ria Bush Other Clinician: Jeanine Luz Referring Nnamdi Dacus: Ria Bush Treating Ferrell Flam/Extender: Skipper Cliche in Treatment: 52 Active Problems Location of Pain Severity and Description of Pain Patient Has Paino No Site Locations Pain Management and Medication Current Pain Management: Electronic Signature(s) Signed: 08/02/2020 3:51:10 PM By: Carlene Coria RN Entered By: Carlene Coria on 07/25/2020 14:25:12 [name] ([id number]) -------------------------------------------------------------------------------- Patient/Caregiver Education Details Patient Name: [name] Date of Service: 07/25/2020 2:00  PM Medical Record Number: [id number] Patient Account Number: [id number] Date of Birth/Gender: 1929/11/28 (87 y.o. F) Treating RN: Dolan Amen Primary Care Physician: Ria Bush Other Clinician: Jeanine Luz Referring Physician: Ria Bush Treating Physician/Extender: Skipper Cliche in Treatment: 61 Education Assessment Education Provided To: Patient Education Topics Provided Wound/Skin Impairment: Methods: Explain/Verbal Responses: State content correctly Electronic Signature(s) Signed: 07/25/2020 5:05:23 PM By: Georges Mouse, Minus Breeding RN Entered By: Georges Mouse, Minus Breeding on 07/25/2020 14:38:07 [name] ([id number]) -------------------------------------------------------------------------------- Wound Assessment Details Patient Name: [name] Date of Service: 07/25/2020 2:00 PM Medical Record Number: [id number] Patient Account Number: [id number] Date of Birth/Sex: Feb 16, 1930 (87 y.o. F) Treating RN: Carlene Coria Primary Care Jarick Harkins: Ria Bush Other Clinician: Jeanine Luz Referring Jarius Dieudonne: Ria Bush Treating Curtis Cain/Extender: Skipper Cliche in Treatment: 41 Wound Status Wound Number: 3 Primary Etiology: Arterial Insufficiency Ulcer Wound Location: Right Metatarsal head fifth Wound Status: Open Wounding Event: Gradually Appeared Comorbid History: Arrhythmia, Osteoarthritis Date Acquired: 04/09/2019 Weeks Of Treatment: 41 Clustered Wound: No Photos Wound Measurements Length: (cm) 0.1 Width: (cm) 0.1 Depth: (cm) 0.2 Area: (cm) 0.008 Volume: (cm) 0.002 % Reduction in Area: 99.3% % Reduction in Volume: 98.2% Epithelialization: Small (1-33%) Tunneling: No Undermining: No Wound Description Classification: Full Thickness Without Exposed Support Structures Wound Margin: Distinct, outline attached Exudate Amount: Small Exudate Type: Serosanguineous Exudate Color: red, brown Foul Odor After Cleansing:  No Slough/Fibrino No Wound Bed Granulation Amount: Large (67-100%) Exposed Structure Granulation Quality: Red, Pink Fascia Exposed: No Necrotic Amount: None Present (0%) Fat Layer (Subcutaneous Tissue) Exposed: Yes Tendon Exposed: No Muscle Exposed: No Joint Exposed: No Bone Exposed: No Treatment Notes Wound #3 (Metatarsal head fifth) Wound Laterality: Right Cleanser Normal Saline Discharge Instruction: Wash your hands with soap and water. Remove old dressing, discard into plastic bag and place into trash. Cleanse the wound with Normal Saline prior to applying a clean dressing using gauze sponges, not tissues or cotton balls. Do not scrub or use excessive force. Pat dry using gauze sponges, not tissue or cotton balls. JENNFER, SONIA ([id number]) Peri-Wound Care Topical Primary Dressing Hydrofera Blue Classic Foam Rope Dressing, 9x6 (mm/in) Discharge Instruction: Cut in half, about 2 cm length-pack in wound-put in a couple drops of saline (extras sent with patient) Secondary Dressing Mepilex Border Flex, 4x4 (in/in) Discharge Instruction: Apply to wound as directed. Do not cut. Secured With Compression Wrap Compression Stockings Environmental education officer) Signed: 08/02/2020 3:51:10 PM By: Carlene Coria RN Entered By: Carlene Coria on 07/25/2020 14:26:16 BREIANA, ECKHARDT ([id number]) -------------------------------------------------------------------------------- Vitals Details Patient Name: [name] Date of Service: 07/25/2020 2:00 PM Medical Record Number: [id number] Patient Account Number: [id number] Date of Birth/Sex: Aug 24, 1929 (87 y.o. F) Treating RN: Carlene Coria Primary Care Ismael Karge: Ria Bush Other Clinician: Jeanine Luz Referring Laylanie Kruczek: Ria Bush Treating Yousef Huge/Extender: Skipper Cliche in Treatment: 41 Vital Signs Time Taken: 14:24 Temperature (F): 98.3 Height (in): 62 Pulse (  bpm): 58 Weight (lbs): 140 Respiratory  Rate (breaths/min): 20 Body Mass Index (BMI): 25.6 Blood Pressure (mmHg): 155/70 Reference Range: 80 - 120 mg / dl Electronic Signature(s) Signed: 08/02/2020 3:51:10 PM By: Carlene Coria RN Entered By: Carlene Coria on 07/25/2020 14:25:04

## 2020-08-03 ENCOUNTER — Encounter: Payer: Self-pay | Admitting: Adult Health Nurse Practitioner

## 2020-08-03 ENCOUNTER — Other Ambulatory Visit: Payer: Self-pay

## 2020-08-03 ENCOUNTER — Other Ambulatory Visit: Payer: Medicare Other | Admitting: Adult Health Nurse Practitioner

## 2020-08-03 VITALS — BP 138/62 | HR 52 | Wt 128.4 lb

## 2020-08-03 DIAGNOSIS — M5416 Radiculopathy, lumbar region: Secondary | ICD-10-CM | POA: Diagnosis not present

## 2020-08-03 DIAGNOSIS — I5032 Chronic diastolic (congestive) heart failure: Secondary | ICD-10-CM

## 2020-08-03 DIAGNOSIS — M5136 Other intervertebral disc degeneration, lumbar region: Secondary | ICD-10-CM | POA: Diagnosis not present

## 2020-08-03 DIAGNOSIS — M48062 Spinal stenosis, lumbar region with neurogenic claudication: Secondary | ICD-10-CM | POA: Diagnosis not present

## 2020-08-03 DIAGNOSIS — F0390 Unspecified dementia without behavioral disturbance: Secondary | ICD-10-CM

## 2020-08-03 DIAGNOSIS — G6289 Other specified polyneuropathies: Secondary | ICD-10-CM

## 2020-08-03 DIAGNOSIS — Z515 Encounter for palliative care: Secondary | ICD-10-CM

## 2020-08-03 DIAGNOSIS — L97519 Non-pressure chronic ulcer of other part of right foot with unspecified severity: Secondary | ICD-10-CM | POA: Diagnosis not present

## 2020-08-03 DIAGNOSIS — M62830 Muscle spasm of back: Secondary | ICD-10-CM | POA: Diagnosis not present

## 2020-08-03 NOTE — Telephone Encounter (Signed)
Spoke with pt's daughter, [name] (on dpr), asking for update on pt.  Says her legs are improving.  States pt is also taking tizanidine (not sure of dose), which seems to be helping.  [name] is driving at the moment but will call back or send a MyChart message with more info.  She also mentioned pt was having pain in right hip and was given 2 injections by back doctor.  Per [name], she does not see a need to see neuro.  Says they told pt awhile back that there wasn't much more they could do.

## 2020-08-03 NOTE — Progress Notes (Signed)
[facility] Consult Note Telephone: [phone]  Fax: [phone]    Date of encounter: 08/03/20 PATIENT NAME: [name] [address] [place] [address]   [phone] (home)  DOB: 1929/12/12 MRN: [id number] PRIMARY CARE PROVIDER:    Ria Bush, MD,  [place] [address] [phone]  REFERRING PROVIDER:   Ria Bush, MD [address] [place],  [place] [address] [phone]  RESPONSIBLE PARTY:    Contact Information    Name Relation Home Work [place] Daughter [phone]     [name] Daughter [phone]  [phone]   [name] Daughter [phone]  [phone]   [place] Daughter   [phone]       I met face to face with patient and family in home. Palliative Care was asked to follow this patient by consultation request of  Ria Bush, MD to address advance care planning and complex medical decision making. This is a follow up visit.  Daughter present during visit today                                   ASSESSMENT AND PLAN / RECOMMENDATIONS:   Advance Care Planning/Goals of Care: Goals include to maximize quality of life and symptom management.  CODE STATUS: DNR  Symptom Management/Plan:  Peripheral neuropathy: Continue current treatment for neuropathy.  Hesitant to increase gabapentin as this has caused her increased unsteadiness in the past and has been decreased.  Daughter does state that they have started taking a supplement that is supposed to help with neuropathy called alpha lipoid acid.  Daughter has consulted with pharmacy prior to starting the supplement.  Have also suggested capsaicin cream which is over-the-counter.  CHF: This is stable at this time.  Continue follow-up and recommendations by cardiology  Dementia: Patient having increased forgetfulness.  Have encouraged getting more help in the home and  she has agreed to work with her daughter on this.  Wound right foot: Continue follow-up and recommendations at wound clinic   Follow up Palliative Care Visit: Palliative care will continue to follow for complex medical decision making, advance care planning, and clarification of goals. Return 8 weeks or prn.  Encouraged to call with any questions or concerns  I spent 60 minutes providing this consultation. More than 50% of the time in this consultation was spent in counseling and care coordination.   PPS: 60%  HOSPICE ELIGIBILITY/DIAGNOSIS: TBD  Chief Complaint: follow up palliative visit  HISTORY OF PRESENT ILLNESS:  [name] is a 87 y.o. year old female  with mild dementia,CHF, HTN, PVD with chronic ulcer of right foot, peripheral neuropathy, GERD, HLD, A. fib, RLS, CAD, CKD D stage III.  Patient is having increased neuropathy pain in her feet.  She has been having increased symptoms of RLS.  Tizanidine at night is helping with her RLS.  Her gabapentin has been increased from 300 mg nightly to 400 mg nightly.  She also uses Voltaren gel and muscle cream which she states does give her relief during the day.  She states that the pain feels like electric shocks in her feet going up into her legs and the pain is worsened when she gets up and walks.  She is able to ambulate with walker however today she is walking slower than usual.  Daughter does state that she tends to have these  neuropathy flareups and that they can last for several days and then she will be fine.  Patient has been having increased back and hip pain.  She does have appointment today with Ortho for injections in her back.  She does get relief with these joint injections every 3 months.  Daughter states that she is having more forgetfulness and has come in in the afternoon and found that she has not had her morning meds.  The patient will state that she consistently takes her morning meds.  Concerns that patient is not eating  enough though she states that she is.  Her weight appears to be stable around 126 to 129 pounds.  She does supplement with Ensure.  Patient does endorse that she does not have much of an appetite but she does encourage herself to eat.  Patient is still being followed at wound clinic for wound to right foot.  Daughter does state that this is almost healed up.  Daughter did state about 3 weeks ago the wound did get larger and was divided at wound clinic in which a piece of bone had come out.  Biopsy of this bone fragment showed infection in bone and patient is on doxycycline for 2 months.  Rest of 10 point ROS asked and negative except what is stated in HPI.  History obtained from review of EMR and interview with family and [name].  I reviewed available labs, medications, imaging, studies and related documents from the EMR.  Records reviewed and summarized above.    PHYSICAL EXAM:   General: NAD, frail appearing Eyes: Sclera anicteric and noninjected with no discharge noted Cardiovascular: regular rate and rhythm Pulmonary: Lung sounds clear; normal respiratory effort Abdomen: soft, nontender, + bowel sounds Extremities: trace edema to left foot, no joint deformities; feet are purple in appearance which is relieved with propping Skin: no rashes on exposed skin.  Has wound to lateral side of right foot being managed by wound clinic Neurological: Weakness but otherwise nonfocal; has forgetfulness  Thank you for the opportunity to participate in the care of [name].  The palliative care team will continue to follow. Please call our office at [phone] if we can be of additional assistance.   Darling Cieslewicz Jenetta Downer, NP , DNP  This chart was dictated using voice recognition software. Despite best efforts to proofread, errors can occur which can change the documentation meaning.   COVID-19 PATIENT SCREENING TOOL Asked and negative response unless otherwise noted:   Have you had symptoms of  covid, tested positive or been in contact with someone with symptoms/positive test in the past 5-10 days? negative

## 2020-08-04 DIAGNOSIS — I5032 Chronic diastolic (congestive) heart failure: Secondary | ICD-10-CM | POA: Diagnosis not present

## 2020-08-04 DIAGNOSIS — M5136 Other intervertebral disc degeneration, lumbar region: Secondary | ICD-10-CM | POA: Diagnosis not present

## 2020-08-04 DIAGNOSIS — E559 Vitamin D deficiency, unspecified: Secondary | ICD-10-CM | POA: Diagnosis not present

## 2020-08-04 DIAGNOSIS — Z792 Long term (current) use of antibiotics: Secondary | ICD-10-CM | POA: Diagnosis not present

## 2020-08-04 DIAGNOSIS — M810 Age-related osteoporosis without current pathological fracture: Secondary | ICD-10-CM | POA: Diagnosis not present

## 2020-08-04 DIAGNOSIS — E7849 Other hyperlipidemia: Secondary | ICD-10-CM | POA: Diagnosis not present

## 2020-08-04 DIAGNOSIS — I4891 Unspecified atrial fibrillation: Secondary | ICD-10-CM | POA: Diagnosis not present

## 2020-08-04 DIAGNOSIS — L97513 Non-pressure chronic ulcer of other part of right foot with necrosis of muscle: Secondary | ICD-10-CM | POA: Diagnosis not present

## 2020-08-04 DIAGNOSIS — G2581 Restless legs syndrome: Secondary | ICD-10-CM | POA: Diagnosis not present

## 2020-08-04 DIAGNOSIS — I272 Pulmonary hypertension, unspecified: Secondary | ICD-10-CM | POA: Diagnosis not present

## 2020-08-04 DIAGNOSIS — I2510 Atherosclerotic heart disease of native coronary artery without angina pectoris: Secondary | ICD-10-CM | POA: Diagnosis not present

## 2020-08-04 DIAGNOSIS — I872 Venous insufficiency (chronic) (peripheral): Secondary | ICD-10-CM | POA: Diagnosis not present

## 2020-08-04 DIAGNOSIS — E538 Deficiency of other specified B group vitamins: Secondary | ICD-10-CM | POA: Diagnosis not present

## 2020-08-04 DIAGNOSIS — K219 Gastro-esophageal reflux disease without esophagitis: Secondary | ICD-10-CM | POA: Diagnosis not present

## 2020-08-04 DIAGNOSIS — G9009 Other idiopathic peripheral autonomic neuropathy: Secondary | ICD-10-CM | POA: Diagnosis not present

## 2020-08-04 DIAGNOSIS — Z9181 History of falling: Secondary | ICD-10-CM | POA: Diagnosis not present

## 2020-08-04 DIAGNOSIS — Z7901 Long term (current) use of anticoagulants: Secondary | ICD-10-CM | POA: Diagnosis not present

## 2020-08-04 DIAGNOSIS — I130 Hypertensive heart and chronic kidney disease with heart failure and stage 1 through stage 4 chronic kidney disease, or unspecified chronic kidney disease: Secondary | ICD-10-CM | POA: Diagnosis not present

## 2020-08-04 DIAGNOSIS — S62102D Fracture of unspecified carpal bone, left wrist, subsequent encounter for fracture with routine healing: Secondary | ICD-10-CM | POA: Diagnosis not present

## 2020-08-04 DIAGNOSIS — M1990 Unspecified osteoarthritis, unspecified site: Secondary | ICD-10-CM | POA: Diagnosis not present

## 2020-08-04 DIAGNOSIS — N1831 Chronic kidney disease, stage 3a: Secondary | ICD-10-CM | POA: Diagnosis not present

## 2020-08-04 DIAGNOSIS — Z7982 Long term (current) use of aspirin: Secondary | ICD-10-CM | POA: Diagnosis not present

## 2020-08-07 DIAGNOSIS — G2581 Restless legs syndrome: Secondary | ICD-10-CM | POA: Diagnosis not present

## 2020-08-07 DIAGNOSIS — E7849 Other hyperlipidemia: Secondary | ICD-10-CM | POA: Diagnosis not present

## 2020-08-07 DIAGNOSIS — E559 Vitamin D deficiency, unspecified: Secondary | ICD-10-CM | POA: Diagnosis not present

## 2020-08-07 DIAGNOSIS — K219 Gastro-esophageal reflux disease without esophagitis: Secondary | ICD-10-CM | POA: Diagnosis not present

## 2020-08-07 DIAGNOSIS — E538 Deficiency of other specified B group vitamins: Secondary | ICD-10-CM | POA: Diagnosis not present

## 2020-08-07 DIAGNOSIS — N1831 Chronic kidney disease, stage 3a: Secondary | ICD-10-CM | POA: Diagnosis not present

## 2020-08-07 DIAGNOSIS — I4891 Unspecified atrial fibrillation: Secondary | ICD-10-CM | POA: Diagnosis not present

## 2020-08-07 DIAGNOSIS — M5136 Other intervertebral disc degeneration, lumbar region: Secondary | ICD-10-CM | POA: Diagnosis not present

## 2020-08-07 DIAGNOSIS — I2510 Atherosclerotic heart disease of native coronary artery without angina pectoris: Secondary | ICD-10-CM | POA: Diagnosis not present

## 2020-08-07 DIAGNOSIS — S62102D Fracture of unspecified carpal bone, left wrist, subsequent encounter for fracture with routine healing: Secondary | ICD-10-CM | POA: Diagnosis not present

## 2020-08-07 DIAGNOSIS — Z792 Long term (current) use of antibiotics: Secondary | ICD-10-CM | POA: Diagnosis not present

## 2020-08-07 DIAGNOSIS — L97513 Non-pressure chronic ulcer of other part of right foot with necrosis of muscle: Secondary | ICD-10-CM | POA: Diagnosis not present

## 2020-08-07 DIAGNOSIS — Z9181 History of falling: Secondary | ICD-10-CM | POA: Diagnosis not present

## 2020-08-07 DIAGNOSIS — G9009 Other idiopathic peripheral autonomic neuropathy: Secondary | ICD-10-CM | POA: Diagnosis not present

## 2020-08-07 DIAGNOSIS — Z7982 Long term (current) use of aspirin: Secondary | ICD-10-CM | POA: Diagnosis not present

## 2020-08-07 DIAGNOSIS — I872 Venous insufficiency (chronic) (peripheral): Secondary | ICD-10-CM | POA: Diagnosis not present

## 2020-08-07 DIAGNOSIS — I130 Hypertensive heart and chronic kidney disease with heart failure and stage 1 through stage 4 chronic kidney disease, or unspecified chronic kidney disease: Secondary | ICD-10-CM | POA: Diagnosis not present

## 2020-08-07 DIAGNOSIS — I272 Pulmonary hypertension, unspecified: Secondary | ICD-10-CM | POA: Diagnosis not present

## 2020-08-07 DIAGNOSIS — M810 Age-related osteoporosis without current pathological fracture: Secondary | ICD-10-CM | POA: Diagnosis not present

## 2020-08-07 DIAGNOSIS — M1990 Unspecified osteoarthritis, unspecified site: Secondary | ICD-10-CM | POA: Diagnosis not present

## 2020-08-07 DIAGNOSIS — Z7901 Long term (current) use of anticoagulants: Secondary | ICD-10-CM | POA: Diagnosis not present

## 2020-08-07 DIAGNOSIS — I5032 Chronic diastolic (congestive) heart failure: Secondary | ICD-10-CM | POA: Diagnosis not present

## 2020-08-08 ENCOUNTER — Encounter: Payer: Medicare Other | Attending: Physician Assistant | Admitting: Physician Assistant

## 2020-08-08 ENCOUNTER — Other Ambulatory Visit: Payer: Self-pay

## 2020-08-08 DIAGNOSIS — I739 Peripheral vascular disease, unspecified: Secondary | ICD-10-CM | POA: Insufficient documentation

## 2020-08-08 DIAGNOSIS — M86671 Other chronic osteomyelitis, right ankle and foot: Secondary | ICD-10-CM | POA: Diagnosis not present

## 2020-08-08 DIAGNOSIS — L97519 Non-pressure chronic ulcer of other part of right foot with unspecified severity: Secondary | ICD-10-CM | POA: Diagnosis present

## 2020-08-08 DIAGNOSIS — L97512 Non-pressure chronic ulcer of other part of right foot with fat layer exposed: Secondary | ICD-10-CM | POA: Diagnosis not present

## 2020-08-08 DIAGNOSIS — B353 Tinea pedis: Secondary | ICD-10-CM | POA: Diagnosis not present

## 2020-08-08 DIAGNOSIS — Z7901 Long term (current) use of anticoagulants: Secondary | ICD-10-CM | POA: Diagnosis not present

## 2020-08-08 DIAGNOSIS — S51812A Laceration without foreign body of left forearm, initial encounter: Secondary | ICD-10-CM | POA: Diagnosis not present

## 2020-08-08 DIAGNOSIS — W19XXXA Unspecified fall, initial encounter: Secondary | ICD-10-CM | POA: Diagnosis not present

## 2020-08-08 DIAGNOSIS — S51011A Laceration without foreign body of right elbow, initial encounter: Secondary | ICD-10-CM | POA: Insufficient documentation

## 2020-08-08 DIAGNOSIS — I480 Paroxysmal atrial fibrillation: Secondary | ICD-10-CM | POA: Diagnosis not present

## 2020-08-08 NOTE — Progress Notes (Signed)
FRANCHESCA, VENEZIANO ([id number]) Visit Report for 08/08/2020 Arrival Information Details Patient Name: [name], [name]. Date of Service: 08/08/2020 11:00 AM Medical Record Number: [id number] Patient Account Number: [id number] Date of Birth/Sex: [birth year] (87 y.o. F) Treating RN: Donnamarie Poag Primary Care Kailah Pennel: Ria Bush Other Clinician: Jeanine Luz Referring Taylen Wendland: Ria Bush Treating Adrieanna Boteler/Extender: Skipper Cliche in Treatment: 22 Visit Information History Since Last Visit Added or deleted any medications: No Patient Arrived: Gilford Rile Had a fall or experienced change in No Arrival Time: 11:15 activities of daily living that may affect Accompanied By: daughter risk of falls: Transfer Assistance: EasyPivot Patient Lift Hospitalized since last visit: No Patient Identification Verified: Yes Has Dressing in Place as Prescribed: Yes Secondary Verification Process Completed: Yes Pain Present Now: Yes Patient Requires Transmission-Based No Precautions: Patient Has Alerts: Yes Patient Alerts: 08/31/19 ABI R)1.3 L) 1.31 08/31/19 TBI R).61 L).58 Electronic Signature(s) Signed: 08/08/2020 4:14:10 PM By: Donnamarie Poag Entered By: Donnamarie Poag on 08/08/2020 11:15:56 [name] ([id number]) -------------------------------------------------------------------------------- Clinic Level of Care Assessment Details Patient Name: [name]. Date of Service: 08/08/2020 11:00 AM Medical Record Number: [id number] Patient Account Number: [id number] Date of Birth/Sex: 1930-04-06 (87 y.o. F) Treating RN: Dolan Amen Primary Care Naveh Rickles: Ria Bush Other Clinician: Jeanine Luz Referring Kassius Battiste: Ria Bush Treating Selim Durden/Extender: Skipper Cliche in Treatment: 19 Clinic Level of Care Assessment Items TOOL 4 Quantity Score X - Use when only an EandM is performed on FOLLOW-UP visit 1 0 ASSESSMENTS - Nursing Assessment / Reassessment X -  Reassessment of Co-morbidities (includes updates in patient status) 1 10 X- 1 5 Reassessment of Adherence to Treatment Plan ASSESSMENTS - Wound and Skin Assessment / Reassessment X - Simple Wound Assessment / Reassessment - one wound 1 5 []  - 0 Complex Wound Assessment / Reassessment - multiple wounds []  - 0 Dermatologic / Skin Assessment (not related to wound area) ASSESSMENTS - Focused Assessment []  - Circumferential Edema Measurements - multi extremities 0 []  - 0 Nutritional Assessment / Counseling / Intervention []  - 0 Lower Extremity Assessment (monofilament, tuning fork, pulses) []  - 0 Peripheral Arterial Disease Assessment (using hand held doppler) ASSESSMENTS - Ostomy and/or Continence Assessment and Care []  - Incontinence Assessment and Management 0 []  - 0 Ostomy Care Assessment and Management (repouching, etc.) PROCESS - Coordination of Care X - Simple Patient / Family Education for ongoing care 1 15 []  - 0 Complex (extensive) Patient / Family Education for ongoing care X- 1 10 Staff obtains Programmer, systems, Records, Test Results / Process Orders []  - 0 Staff telephones HHA, Nursing Homes / Clarify orders / etc []  - 0 Routine Transfer to another Facility (non-emergent condition) []  - 0 Routine Hospital Admission (non-emergent condition) []  - 0 New Admissions / Biomedical engineer / Ordering NPWT, Apligraf, etc. []  - 0 Emergency Hospital Admission (emergent condition) X- 1 10 Simple Discharge Coordination []  - 0 Complex (extensive) Discharge Coordination PROCESS - Special Needs []  - Pediatric / Minor Patient Management 0 []  - 0 Isolation Patient Management []  - 0 Hearing / Language / Visual special needs []  - 0 Assessment of Community assistance (transportation, D/C planning, etc.) []  - 0 Additional assistance / Altered mentation []  - 0 Support Surface(s) Assessment (bed, cushion, seat, etc.) INTERVENTIONS - Wound Cleansing / Measurement ELIETTE, DRUMWRIGHT.  ([id number]) X- 1 5 Simple Wound Cleansing - one wound []  - 0 Complex Wound Cleansing - multiple wounds X- 1 5 Wound Imaging (photographs - any number of wounds) []  - 0 Wound Tracing (  instead of photographs) X- 1 5 Simple Wound Measurement - one wound []  - 0 Complex Wound Measurement - multiple wounds INTERVENTIONS - Wound Dressings []  - Small Wound Dressing one or multiple wounds 0 X- 1 15 Medium Wound Dressing one or multiple wounds []  - 0 Large Wound Dressing one or multiple wounds []  - 0 Application of Medications - topical []  - 0 Application of Medications - injection INTERVENTIONS - Miscellaneous []  - External ear exam 0 []  - 0 Specimen Collection (cultures, biopsies, blood, body fluids, etc.) []  - 0 Specimen(s) / Culture(s) sent or taken to Lab for analysis []  - 0 Patient Transfer (multiple staff / Civil Service fast streamer / Similar devices) []  - 0 Simple Staple / Suture removal (25 or less) []  - 0 Complex Staple / Suture removal (26 or more) []  - 0 Hypo / Hyperglycemic Management (close monitor of Blood Glucose) []  - 0 Ankle / Brachial Index (ABI) - do not check if billed separately X- 1 5 Vital Signs Has the patient been seen at the hospital within the last three years: Yes Total Score: 90 Level Of Care: New/Established - Level 3 Electronic Signature(s) Signed: 08/08/2020 4:05:56 PM By: Georges Mouse, Minus Breeding RN Entered By: Georges Mouse, Kenia on 08/08/2020 11:53:21 [name] ([id number]) -------------------------------------------------------------------------------- Encounter Discharge Information Details Patient Name: [name] Date of Service: 08/08/2020 11:00 AM Medical Record Number: [id number] Patient Account Number: [id number] Date of Birth/Sex: Oct 18, 1929 (87 y.o. F) Treating RN: Dolan Amen Primary Care Amenah Tucci: Ria Bush Other Clinician: Jeanine Luz Referring Gailya Tauer: Ria Bush Treating Kerigan Narvaez/Extender: Skipper Cliche in Treatment: 59 Encounter Discharge Information Items Discharge Condition: Stable Ambulatory Status: Walker Discharge Destination: Home Transportation: Private Auto Accompanied By: self Schedule Follow-up Appointment: Yes Clinical Summary of Care: Electronic Signature(s) Signed: 08/08/2020 5:01:09 PM By: Jeanine Luz Entered By: Jeanine Luz on 08/08/2020 12:06:54 [name] ([id number]) -------------------------------------------------------------------------------- Lower Extremity Assessment Details Patient Name: [name]. Date of Service: 08/08/2020 11:00 AM Medical Record Number: [id number] Patient Account Number: [id number] Date of Birth/Sex: 11/01/1929 (87 y.o. F) Treating RN: Donnamarie Poag Primary Care Donevin Sainsbury: Ria Bush Other Clinician: Jeanine Luz Referring Eloy Fehl: Ria Bush Treating Anglia Blakley/Extender: Skipper Cliche in Treatment: 43 Edema Assessment Assessed: [Left: Yes] [Right: Yes] Edema: [Left: Yes] [Right: Yes] Vascular Assessment Pulses: Dorsalis Pedis Palpable: [Left:Yes] [Right:Yes] Notes family states feet/legs swell after OOB for hours and on feet and when not elevated such as since 8:30am this day Electronic Signature(s) Signed: 08/08/2020 4:14:10 PM By: Donnamarie Poag Entered By: Donnamarie Poag on 08/08/2020 11:21:34 [name] ([id number]) -------------------------------------------------------------------------------- Multi Wound Chart Details Patient Name: [name]. Date of Service: 08/08/2020 11:00 AM Medical Record Number: [id number] Patient Account Number: [id number] Date of Birth/Sex: [birth year] (87 y.o. F) Treating RN: Dolan Amen Primary Care Khalie Wince: Ria Bush Other Clinician: Jeanine Luz Referring Coburn Knaus: Ria Bush Treating Daejon Lich/Extender: Skipper Cliche in Treatment: 96 Vital Signs Height(in): 62 Pulse(bpm): 76 Weight(lbs): 140 Blood  Pressure(mmHg): 192/83 Body Mass Index(BMI): 26 Temperature(F): 98.2 Respiratory Rate(breaths/min): 18 Photos: [N/A:N/A] Wound Location: Right Metatarsal head fifth N/A N/A Wounding Event: Gradually Appeared N/A N/A Primary Etiology: Arterial Insufficiency Ulcer N/A N/A Comorbid History: Arrhythmia, Osteoarthritis N/A N/A Date Acquired: 04/09/2019 N/A N/A Weeks of Treatment: 43 N/A N/A Wound Status: Open N/A N/A Measurements L x W x D (cm) 0.1x0.1x0.2 N/A N/A Area (cm) : 0.008 N/A N/A Volume (cm) : 0.002 N/A N/A % Reduction in Area: 99.30% N/A N/A % Reduction in Volume: 98.20% N/A N/A Classification: Full  Thickness Without Exposed N/A N/A Support Structures Exudate Amount: Small N/A N/A Exudate Type: Serosanguineous N/A N/A Exudate Color: red, brown N/A N/A Wound Margin: Distinct, outline attached N/A N/A Granulation Amount: Large (67-100%) N/A N/A Granulation Quality: Red, Pink N/A N/A Necrotic Amount: None Present (0%) N/A N/A Exposed Structures: Fat Layer (Subcutaneous Tissue): N/A N/A Yes Fascia: No Tendon: No Muscle: No Joint: No Bone: No Epithelialization: Small (1-33%) N/A N/A Treatment Notes Electronic Signature(s) Signed: 08/08/2020 4:05:56 PM By: Georges Mouse, Minus Breeding RN Entered By: Georges Mouse, Minus Breeding on 08/08/2020 11:48:41 [name] ([id number]) -------------------------------------------------------------------------------- [place] Details Patient Name: [name]. Date of Service: 08/08/2020 11:00 AM Medical Record Number: [id number] Patient Account Number: [id number] Date of Birth/Sex: 1930-01-06 (87 y.o. F) Treating RN: Dolan Amen Primary Care Hinley Brimage: Ria Bush Other Clinician: Jeanine Luz Referring Amandalynn Pitz: Ria Bush Treating Harshith Pursell/Extender: Jeri Cos Weeks in Treatment: 69 Active Inactive Electronic Signature(s) Signed: 08/08/2020 4:05:56 PM By: Georges Mouse, Minus Breeding RN Entered  By: Georges Mouse, Minus Breeding on 08/08/2020 11:48:32 [name] ([id number]) -------------------------------------------------------------------------------- Pain Assessment Details Patient Name: [name] Date of Service: 08/08/2020 11:00 AM Medical Record Number: [id number] Patient Account Number: [id number] Date of Birth/Sex: Aug 11, 1929 (87 y.o. F) Treating RN: Donnamarie Poag Primary Care Milliani Herrada: Ria Bush Other Clinician: Jeanine Luz Referring Nirvaan Frett: Ria Bush Treating Chassidy Layson/Extender: Skipper Cliche in Treatment: 33 Active Problems Location of Pain Severity and Description of Pain Patient Has Paino Yes Site Locations Pain Location: Generalized Pain, Pain in Ulcers Rate the pain. Current Pain Level: 8 Pain Management and Medication Current Pain Management: Notes states pain with neuropathy in left foot is high-note red, edematous left foot with skin intact and report no injury Electronic Signature(s) Signed: 08/08/2020 4:14:10 PM By: Donnamarie Poag Entered By: Donnamarie Poag on 08/08/2020 11:17:32 [name] ([id number]) -------------------------------------------------------------------------------- Patient/Caregiver Education Details Patient Name: [name] Date of Service: 08/08/2020 11:00 AM Medical Record Number: [id number] Patient Account Number: [id number] Date of Birth/Gender: 1929/08/27 (87 y.o. F) Treating RN: Dolan Amen Primary Care Physician: Ria Bush Other Clinician: Jeanine Luz Referring Physician: Ria Bush Treating Physician/Extender: Skipper Cliche in Treatment: 47 Education Assessment Education Provided To: Patient Education Topics Provided Wound/Skin Impairment: Methods: Explain/Verbal Responses: State content correctly Electronic Signature(s) Signed: 08/08/2020 4:05:56 PM By: Georges Mouse, Minus Breeding RN Entered By: Georges Mouse, Minus Breeding on 08/08/2020 11:53:33 [name]  ([id number]) -------------------------------------------------------------------------------- Wound Assessment Details Patient Name: [name] Date of Service: 08/08/2020 11:00 AM Medical Record Number: [id number] Patient Account Number: [id number] Date of Birth/Sex: [birth year] (87 y.o. F) Treating RN: Donnamarie Poag Primary Care Navaeh Kehres: Ria Bush Other Clinician: Jeanine Luz Referring Florina Glas: Ria Bush Treating Desmond Tufano/Extender: Skipper Cliche in Treatment: 43 Wound Status Wound Number: 3 Primary Etiology: Arterial Insufficiency Ulcer Wound Location: Right Metatarsal head fifth Wound Status: Open Wounding Event: Gradually Appeared Comorbid History: Arrhythmia, Osteoarthritis Date Acquired: 04/09/2019 Weeks Of Treatment: 43 Clustered Wound: No Photos Wound Measurements Length: (cm) 0.1 Width: (cm) 0.1 Depth: (cm) 0.2 Area: (cm) 0.008 Volume: (cm) 0.002 % Reduction in Area: 99.3% % Reduction in Volume: 98.2% Epithelialization: Small (1-33%) Tunneling: No Undermining: No Wound Description Classification: Full Thickness Without Exposed Support Structures Wound Margin: Distinct, outline attached Exudate Amount: Small Exudate Type: Serosanguineous Exudate Color: red, brown Foul Odor After Cleansing: No Slough/Fibrino No Wound Bed Granulation Amount: Large (67-100%) Exposed Structure Granulation Quality: Red, Pink Fascia Exposed: No Necrotic Amount: None Present (0%) Fat Layer (Subcutaneous Tissue) Exposed: Yes Tendon Exposed: No Muscle Exposed: No Joint  Exposed: No Bone Exposed: No Treatment Notes Wound #3 (Metatarsal head fifth) Wound Laterality: Right Cleanser Normal Saline Discharge Instruction: Wash your hands with soap and water. Remove old dressing, discard into plastic bag and place into trash. Cleanse the wound with Normal Saline prior to applying a clean dressing using gauze sponges, not tissues or cotton balls. Do not scrub  or use excessive force. Pat dry using gauze sponges, not tissue or cotton balls. SHAELYNN, DRAGOS ([id number]) Peri-Wound Care Topical Primary Dressing Hydrofera Blue Classic Foam Rope Dressing, 9x6 (mm/in) Discharge Instruction: Cut in half, about 2 cm length-pack in wound-put in a couple drops of saline (extras sent with patient) Secondary Dressing Mepilex Border Flex, 4x4 (in/in) Discharge Instruction: Apply to wound as directed. Do not cut. Secured With Compression Wrap Compression Stockings Add-Ons Electronic Signature(s) Signed: 08/08/2020 4:14:10 PM By: Donnamarie Poag Entered By: Donnamarie Poag on 08/08/2020 11:20:34 [name] ([id number]) -------------------------------------------------------------------------------- [place] Details Patient Name: [name]. Date of Service: 08/08/2020 11:00 AM Medical Record Number: [id number] Patient Account Number: [id number] Date of Birth/Sex: 1930-02-07 (87 y.o. F) Treating RN: Donnamarie Poag Primary Care Drucella Karbowski: Ria Bush Other Clinician: Jeanine Luz Referring Kaisley Stiverson: Ria Bush Treating Tramond Slinker/Extender: Skipper Cliche in Treatment: 41 Vital Signs Time Taken: 11:16 Temperature (F): 98.2 Height (in): 62 Pulse (bpm): 76 Weight (lbs): 140 Respiratory Rate (breaths/min): 18 Body Mass Index (BMI): 25.6 Blood Pressure (mmHg): 192/83 Reference Range: 80 - 120 mg / dl Electronic Signature(s) Signed: 08/08/2020 4:14:10 PM By: Donnamarie Poag Entered ByDonnamarie Poag on 08/08/2020 11:16:36

## 2020-08-08 NOTE — Progress Notes (Addendum)
JENAVEE, LAGUARDIA ([id number]) Visit Report for 08/08/2020 Chief Complaint Document Details Patient Name: [name], [name]. Date of Service: 08/08/2020 11:00 AM Medical Record Number: [id number] Patient Account Number: [id number] Date of Birth/Sex: Aug 18, 1929 (87 y.o. F) Treating RN: Dolan Amen Primary Care Provider: Ria Bush Other Clinician: Jeanine Luz Referring Provider: Ria Bush Treating Provider/Extender: Skipper Cliche in Treatment: 41 Information Obtained from: Patient Chief Complaint Right Lateral Foot Ulcer Electronic Signature(s) Signed: 08/08/2020 11:20:16 AM By: Worthy Keeler PA-C Entered By: Worthy Keeler on 08/08/2020 11:20:16 IBTISAM, BENGE ([id number]) -------------------------------------------------------------------------------- HPI Details Patient Name: [name] Date of Service: 08/08/2020 11:00 AM Medical Record Number: [id number] Patient Account Number: [id number] Date of Birth/Sex: 12-20-1929 (87 y.o. F) Treating RN: Dolan Amen Primary Care Provider: Ria Bush Other Clinician: Jeanine Luz Referring Provider: Ria Bush Treating Provider/Extender: Skipper Cliche in Treatment: 42 History of Present Illness HPI Description: 10/12/2019 upon evaluation today patient appears to be doing somewhat poorly in regard to her wound at this point. She has a ulcer over the right fifth metatarsal region. This has been managed by podiatry up to this point. Unfortunately the patient just does not seem to be making the progress that they were hoping for an subsequently the patient was referred to [place] by [facility] Dr. Posey Pronto. With that being said I did note that the patient does have peripheral vascular disease which has been managed by Dr. Lazaro Arms her most recent angiogram with intervention was on 08/02/2019. The ABIs following were on the right 1.30 with a TBI of 0.61 normal left 1.31 with a TBI of 0.58.  Obviously things seem to be doing somewhat better at this point hopefully enough to allow her to heal. She is on anticoagulant therapy due to atrial fibrillation long-term. With that being said the patient is unfortunately having quite a bit of discomfort at this time secondary to the wound. She also has erythema which is very likely secondary to infection. I am going obtain a culture of the area today once debridement is complete. Apparently the patient also had a x-ray that was performed by Dr. Posey Pronto on 09/21/2019 and this showed that the patient had no obvious signs on x-ray of cortical irregularity and no osteomyelitis obviously noted. With that being said as I discussed with the patient's daughter as well this does not indicate that the patient absolutely has no osteomyelitis as x-rays are only at best 50% helpful in identifying this complication. Nonetheless I do believe that we need to have the patient continue to have this further evaluated specifically I am to recommend that we check into an MRI which I think would be ideal to evaluate whether or not there is osteomyelitis present. 7/16; this is a patient with an ulcer over the right fifth metatarsal head. She follows with Dr. Lucky Cowboy [facility]. She is due to have an MRI of the foot on 7/26. Using Santyl to the wound 11/04/2019 on evaluation today patient appears to be doing okay in regard to her wound at this time. Unfortunately since I last saw her which was actually her second visit here in the clinic she has moved into a new apartment and then subsequently had a fall. She ended up in the hospital she did fracture her left arm and subsequently has also been dealing with a lot of dizziness she has been at [employer]. She tells me that getting in the change the dressing has been somewhat difficult as  far as daily dressing changes are concerned with the Santyl. Nonetheless when they do change it that is what is been used and her  daughter-in-law has also been coming in to help out as well with the dressing changes to try to make sure it gets done daily. Fortunately there is no signs of active infection at this time. No fever chills noted. I do believe the Santyl has been beneficial and is likely still the best way to go at this point for the patient. I did review the patient's MRI which showed some potential for reactive osteitis but did not show any signs of osteomyelitis overtly. That cannot be excluded but again right now it does not appear that is very likely present as well. This is good news. I also did review her culture she has been on the Bactrim she tells me she has taken this and again that was appropriate based on the results of her culture. 11/19/2019 upon evaluation today patient appears to be doing decently well in regard to her wound. Fortunately there is no signs of active infection at this time wound is not significantly larger in fact is roughly about the same at this point. With that being said there is not as much erythema as previously noted I think the infection seems to be under much better control which is great news. Unfortunately she has fallen since I last saw her and broken her left arm she has an appointment with them following the appointment with me. 8/25; patient was worked in acutely today at the request of home health and was concerned about her right heel, generalized erythema in her feet bilaterally. According to her daughter things have been getting worse over the last for 5 days but not precisely from a wound care point of view. Her wound is on the lateral part of the right fifth metatarsal head looks about the same as I remember this. She has developed dryness and flaking of the skin on her plantar feet. As well a very tender area localized over the insertion point of the Achilles tendon. She has restless leg syndrome. Does not specifically offload the area at night. She wears socks at  home and does not really give me a pressure on the right heel issue. I reviewed her vascular status. She underwent her last revascularization on 08/02/2019 by Dr. Lucky Cowboy. This included an angioplasty of the right peroneal artery as well as the right posterior tibial artery. Her last noninvasive arterial study was on 11/23/2019. On the right her ABI was 1.08 TBI of 0.48. On the left ABI at 1.21 with a TBI of 0.54. Family relates that Dr. Lucky Cowboy felt that he had done the best he could and this. We have been using Santyl to the one wound she has on the right lateral fifth metatarsal head. She does not have a new wound today which is the concern that letter coming into the clinic 12/09/2019 on evaluation today patient appears to be doing decently well in regard to her foot ulcer at this point. She does note that with the compression wrap that the home health nurse put on after contacting our clinic that she actually did much better and felt much better. Her heel is also improved. Fortunately there is no signs of active infection at this time. No fevers, chills, nausea, vomiting, or diarrhea. 12/24/2019 upon evaluation today patient appears to be doing somewhat poorly still in regard to her foot. Unfortunately this is just not showing the signs  of improvement that I would like to see it is a little bit cleaner but also appears to be erythematous and I believe there is infection noted at this point. We will get a need to address this in my opinion. I did actually recommend that we switch back to the Iodoflex as well apparently the home health nurse had switch this away that not really what we want to do at this point in my opinion. They were using silver alginate I think most recently. 01/06/2020 upon evaluation today patient actually appears to be doing much better in regard to her wound. We have switched back to the silver alginate after her home health nurse contacted me and honestly I feel like she is doing quite  well in that regard. She does have a lot of drainage and therefore I think that is probably a better option for. With that being said she is tolerating the compression wrap without complication that also seems to be helping. 01/28/2020 on evaluation today patient appears to be doing well with regard to her foot ulcer. I do feel like she is showing signs of improvement which is great news. There is no signs of active infection at this time which is also good news. No fevers, chills, nausea, vomiting, or diarrhea. Unfortunately she did have a fall and sustained an injury to the left upper arm. She has a skin tear here this appears to be doing okay although RASHAWNA, SCOLES. ([id number]) the dressing was stuck to this this was an alginate dressing. I really think she probably needs something like Xeroform to try to prevent anything from sticking to this region. Other than that she seems to be doing quite well. 02/11/2020 on evaluation today patient's arm skin tear appears to be completely healed which is great. Unfortunately she is still having issues at this time with her foot although this seems to be showing signs of improvement it still very slow. There is no signs of active infection at this time. 03/06/2020 upon evaluation today patient appears to be doing decently well in regard to her foot ulcer. There is some minimal slough noted on the surface of the wound building up at this point. Otherwise I really feel like she is doing quite nicely with quite a bit of new granulation and epithelial growth. 03/23/2020 upon evaluation today patient appears to be doing well with regards to her wound. She has been tolerating the dressing changes without complication. Fortunately there is no evidence of active infection and overall feel like her foot ulcer is doing excellent at this point. 04/13/2020 on evaluation today patient appears to be doing well with regard to her foot ulcer. She has been tolerating the  dressing changes without complication. Fortunately there is no signs of active infection at this time. Overall I feel like the wound is getting close to complete closure and this appears to be doing quite well in my opinion. 04/27/2020 on evaluation today patient appears to be doing excellent in regard to her wound. She has been tolerating the dressing changes without complication. Fortunately there is no signs of active infection and overall very pleased with where things stand today. In fact I feel like the wound is very close to complete resolution. 05/18/2020 upon evaluation today patient appears to be doing well with regard to her foot ulcer. There is no evidence of active infection at this time which is great news and overall very pleased with where things stand. No fevers, chills, nausea, vomiting, or  diarrhea. I do feel like she is making great progress and I feel like that the wound is very tiny but again this last portion getting close has been somewhat difficult. 07/05/2020 upon evaluation today patient appears to be doing a little poorly in regard to her foot ulcer. Fortunately there is no signs of active infection at this time systemically though locally I feel it may be some cellulitis it looks like this wound may have prematurely sealed up subsequently causing a blister that had to be removed. The good news is that I do not think it is a very bad infection but I believe she may be experiencing cellulitis. 06/16/2020 upon evaluation today patient appears to be doing well with regard to her wound. It seems like it showed signs of improvement compared to last week where she had more infection noted. With that being said I do believe that based on what I am seeing the patient seems to be making excellent progress although I think still this is a small area that is going require some debridement to clear away some of the callus and drainage which is trapping fluid behind it based on what I  see. 06/23/2020 upon evaluation today patient appears to be doing well with regard to her leg ulcer. She has been tolerating the dressing changes without application. And currently I do believe the alginate is doing a good job keeping it dry it still tends to cover over and we do not want her trapping any fluid therefore I do remove that outer callus/drainage covering each time she comes in. Overall I think this is doing a great job. 06/30/2020 patient appears to be doing okay in regard to pain in regard to her wound. Unfortunately she continues to develop a callus buildup over the area. This is unfortunate as to be honest it is continuing to cause problems with her basically being able to heal as she needs to really granulate in from the bottom out on that is not happening. I think we need to find something that works better to keep this area open. 07/10/2020 upon evaluation today patient unfortunately appears to be having increased pain yet again and is not doing nearly as well as she was previous. I do believe this is indication of infection. The patient is going to require a culture today it appears. 07/18/2020 upon evaluation today patient appears to be doing well with regard to her wounds. She has been tolerating the dressing changes without complication. In fact this appears to be doing significantly better compared to last week. I did review her however her pathology which showed acute osteomyelitis. Also did review the culture which showed Staph aureus. She is on Bactrim that does seem to be helping. I am unlikely going to continue that for a total of 2 months. I think they would prefer that over going to infectious disease. I am definitely okay with that as far as that is concerned. I am going to likely go ahead and refill the Bactrim next week for another 2 weeks supply in order to get the patient through for this month and then will have to do 1 more month for a full 8 weeks of treatment.  Several of the times that I sent patients to infectious disease they just kept the patient on oral antibiotic therapy as opposed to switching IV anyway and she seems to be doing very well after just 1 week. 07/25/2020 upon evaluation today patient appears to be doing well with regard  to her wound. She still has an opening and there is still some depth there as well. With that being said I do think that the patient does have osteomyelitis and very well could be a candidate for hyperbaric oxygen therapy. However I Georgina Peer see how she does over the next 2 weeks with the oral antibiotics. Obviously if she is not close by that time I think proceeding with the HBO therapy would be appropriate. I did discuss this with the patient and her daughter today. I think that this may be something to strongly consider going forward if things do not improve over again the next 2 weeks. 08/08/2020 upon evaluation today patient appears to be doing decently well in regard to her wound. The good news is she does not appear to have any signs of infection worsening which is good she still has a small hole open here in the [facility] I think is still appropriate to try to let this granulate in from the bottom out. With that being said this is getting tiny but nonetheless is not completely resolving. Reason I did discuss the possibility of hyperbaric oxygen therapy with the patient and her daughter today again. Electronic Signature(s) Signed: 08/08/2020 4:51:40 PM By: Worthy Keeler PA-C Entered By: Worthy Keeler on 08/08/2020 16:51:40 LAREN, LAWRIE ([id number]) -------------------------------------------------------------------------------- Physical Exam Details Patient Name: [name], [name]. Date of Service: 08/08/2020 11:00 AM Medical Record Number: [id number] Patient Account Number: [id number] Date of Birth/Sex: [birth year] (87 y.o. F) Treating RN: Dolan Amen Primary Care Provider: Ria Bush Other  Clinician: Jeanine Luz Referring Provider: Ria Bush Treating Provider/Extender: Skipper Cliche in Treatment: 58 Constitutional Well-nourished and well-hydrated in no acute distress. Respiratory normal breathing without difficulty. Psychiatric this patient is able to make decisions and demonstrates good insight into disease process. Alert and Oriented x 3. pleasant and cooperative. Notes Upon inspection patient's wound showed signs of good granulation epithelization at this point. She still has an opening at the central portion of the wound but still quite a bit of depth there. Fortunately there does not however appear to be any evidence of infection at this time which is good news. Electronic Signature(s) Signed: 08/08/2020 4:52:18 PM By: Worthy Keeler PA-C Entered By: Worthy Keeler on 08/08/2020 16:52:18 [name], [name] ([id number]) -------------------------------------------------------------------------------- Physician Orders Details Patient Name: [name] Date of Service: 08/08/2020 11:00 AM Medical Record Number: [id number] Patient Account Number: [id number] Date of Birth/Sex: [birth year] (87 y.o. F) Treating RN: Dolan Amen Primary Care Provider: Ria Bush Other Clinician: Jeanine Luz Referring Provider: Ria Bush Treating Provider/Extender: Skipper Cliche in Treatment: 76 Verbal / Phone Orders: No Diagnosis Coding ICD-10 Coding Code Description I73.89 Other specified peripheral vascular diseases L97.512 Non-pressure chronic ulcer of other part of right foot with fat layer exposed M86.671 Other chronic osteomyelitis, right ankle and foot Z79.01 Long term (current) use of anticoagulants I48.0 Paroxysmal atrial fibrillation B35.3 Tinea pedis Follow-up Appointments o Return Appointment in 1 week. [place]: - [place] for wound care. May utilize formulary equivalent  dressing for wound treatment orders unless otherwise specified. Home Health Nurse may visit PRN to address patientos wound care needs. o Scheduled days for dressing changes to be completed; exception, patient has scheduled wound care visit that day. o **Please direct any NON-WOUND related issues/requests for orders to patient's Primary Care Physician. **If current dressing causes regression in wound condition, may D/C ordered dressing product/s and apply Normal Saline  Moist Dressing daily until next [place] or Other MD appointment. **Notify [facility] of regression in wound condition at [phone]. Hyperbaric Oxygen Therapy o Evaluate for HBO Therapy - Discussed in office today, consider this option Medications-Please add to medication list. o P.O. Antibiotics - Doxycycline x 2 weeks Wound Treatment Wound #3 - Metatarsal head fifth Wound Laterality: Right Cleanser: Normal Saline (Generic) 2 x Per Week/30 Days Discharge Instructions: Wash your hands with soap and water. Remove old dressing, discard into plastic bag and place into trash. Cleanse the wound with Normal Saline prior to applying a clean dressing using gauze sponges, not tissues or cotton balls. Do not scrub or use excessive force. Pat dry using gauze sponges, not tissue or cotton balls. Primary Dressing: Hydrofera Blue Classic Foam Rope Dressing, 9x6 (mm/in) 2 x Per Week/30 Days Discharge Instructions: Cut in half, about 2 cm length-pack in wound-put in a couple drops of saline (extras sent with patient) Secondary Dressing: Mepilex Border Flex, 4x4 (in/in) (Generic) 2 x Per Week/30 Days Discharge Instructions: Apply to wound as directed. Do not cut. Patient Medications Allergies: penicillin Notifications Medication Indication Start End doxycycline hyclate 08/09/2020 DOSE 1 - oral 100 mg capsule - 1 capsule oral taken 2 times per day for 30 days Electronic Signature(s) JAZARIA, CANET  ([id number]) Signed: 08/09/2020 6:41:13 PM By: Worthy Keeler PA-C Previous Signature: 08/08/2020 4:05:56 PM Version By: Charlett Nose RN Previous Signature: 08/08/2020 4:59:24 PM Version By: Worthy Keeler PA-C Entered By: Worthy Keeler on 08/09/2020 18:41:13 MALISE, YANIK ([id number]) -------------------------------------------------------------------------------- Problem List Details Patient Name: [name]. Date of Service: 08/08/2020 11:00 AM Medical Record Number: [id number] Patient Account Number: [id number] Date of Birth/Sex: 09-25-1929 (87 y.o. F) Treating RN: Dolan Amen Primary Care Provider: Ria Bush Other Clinician: Jeanine Luz Referring Provider: Ria Bush Treating Provider/Extender: Skipper Cliche in Treatment: 53 Active Problems ICD-10 Encounter Code Description Active Date MDM Diagnosis I73.89 Other specified peripheral vascular diseases 10/12/2019 No Yes L97.512 Non-pressure chronic ulcer of other part of right foot with fat layer 10/12/2019 No Yes exposed M86.671 Other chronic osteomyelitis, right ankle and foot 07/25/2020 No Yes Z79.01 Long term (current) use of anticoagulants 10/12/2019 No Yes I48.0 Paroxysmal atrial fibrillation 10/12/2019 No Yes B35.3 Tinea pedis 12/01/2019 No Yes Inactive Problems Resolved Problems ICD-10 Code Description Active Date Resolved Date S41.102A Unspecified open wound of left upper arm, initial encounter 01/28/2020 01/28/2020 Electronic Signature(s) Signed: 08/08/2020 11:20:11 AM By: Worthy Keeler PA-C Entered By: Worthy Keeler on 08/08/2020 11:20:11 [name] ([id number]) -------------------------------------------------------------------------------- Progress Note Details Patient Name: [name]. Date of Service: 08/08/2020 11:00 AM Medical Record Number: [id number] Patient Account Number: [id number] Date of Birth/Sex: 12-31-1929 (87 y.o. F) Treating RN: Dolan Amen Primary  Care Provider: Ria Bush Other Clinician: Jeanine Luz Referring Provider: Ria Bush Treating Provider/Extender: Skipper Cliche in Treatment: 6 Subjective Chief Complaint Information obtained from Patient Right Lateral Foot Ulcer History of Present Illness (HPI) 10/12/2019 upon evaluation today patient appears to be doing somewhat poorly in regard to her wound at this point. She has a ulcer over the right fifth metatarsal region. This has been managed by podiatry up to this point. Unfortunately the patient just does not seem to be making the progress that they were hoping for an subsequently the patient was referred to [place] by [facility] Dr. Posey Pronto. With that being said I did note that the patient does have peripheral vascular disease which  has been managed by Dr. Lazaro Arms her most recent angiogram with intervention was on 08/02/2019. The ABIs following were on the right 1.30 with a TBI of 0.61 normal left 1.31 with a TBI of 0.58. Obviously things seem to be doing somewhat better at this point hopefully enough to allow her to heal. She is on anticoagulant therapy due to atrial fibrillation long-term. With that being said the patient is unfortunately having quite a bit of discomfort at this time secondary to the wound. She also has erythema which is very likely secondary to infection. I am going obtain a culture of the area today once debridement is complete. Apparently the patient also had a x-ray that was performed by Dr. Posey Pronto on 09/21/2019 and this showed that the patient had no obvious signs on x-ray of cortical irregularity and no osteomyelitis obviously noted. With that being said as I discussed with the patient's daughter as well this does not indicate that the patient absolutely has no osteomyelitis as x-rays are only at best 50% helpful in identifying this complication. Nonetheless I do believe that we need to have the patient continue to have this further  evaluated specifically I am to recommend that we check into an MRI which I think would be ideal to evaluate whether or not there is osteomyelitis present. 7/16; this is a patient with an ulcer over the right fifth metatarsal head. She follows with Dr. Lucky Cowboy at [facility]. She is due to have an MRI of the foot on 7/26. Using Santyl to the wound 11/04/2019 on evaluation today patient appears to be doing okay in regard to her wound at this time. Unfortunately since I last saw her which was actually her second visit here in the clinic she has moved into a new apartment and then subsequently had a fall. She ended up in the hospital she did fracture her left arm and subsequently has also been dealing with a lot of dizziness she has been at [employer]. She tells me that getting in the change the dressing has been somewhat difficult as far as daily dressing changes are concerned with the Santyl. Nonetheless when they do change it that is what is been used and her daughter-in-law has also been coming in to help out as well with the dressing changes to try to make sure it gets done daily. Fortunately there is no signs of active infection at this time. No fever chills noted. I do believe the Santyl has been beneficial and is likely still the best way to go at this point for the patient. I did review the patient's MRI which showed some potential for reactive osteitis but did not show any signs of osteomyelitis overtly. That cannot be excluded but again right now it does not appear that is very likely present as well. This is good news. I also did review her culture she has been on the Bactrim she tells me she has taken this and again that was appropriate based on the results of her culture. 11/19/2019 upon evaluation today patient appears to be doing decently well in regard to her wound. Fortunately there is no signs of active infection at this time wound is not significantly larger in fact is roughly  about the same at this point. With that being said there is not as much erythema as previously noted I think the infection seems to be under much better control which is great news. Unfortunately she has fallen since I last saw her and broken  her left arm she has an appointment with them following the appointment with me. 8/25; patient was worked in acutely today at the request of home health and was concerned about her right heel, generalized erythema in her feet bilaterally. According to her daughter things have been getting worse over the last for 5 days but not precisely from a wound care point of view. Her wound is on the lateral part of the right fifth metatarsal head looks about the same as I remember this. She has developed dryness and flaking of the skin on her plantar feet. As well a very tender area localized over the insertion point of the Achilles tendon. She has restless leg syndrome. Does not specifically offload the area at night. She wears socks at home and does not really give me a pressure on the right heel issue. I reviewed her vascular status. She underwent her last revascularization on 08/02/2019 by Dr. Lucky Cowboy. This included an angioplasty of the right peroneal artery as well as the right posterior tibial artery. Her last noninvasive arterial study was on 11/23/2019. On the right her ABI was 1.08 TBI of 0.48. On the left ABI at 1.21 with a TBI of 0.54. Family relates that Dr. Lucky Cowboy felt that he had done the best he could and this. We have been using Santyl to the one wound she has on the right lateral fifth metatarsal head. She does not have a new wound today which is the concern that letter coming into the clinic 12/09/2019 on evaluation today patient appears to be doing decently well in regard to her foot ulcer at this point. She does note that with the compression wrap that the home health nurse put on after contacting our clinic that she actually did much better and felt much better.  Her heel is also improved. Fortunately there is no signs of active infection at this time. No fevers, chills, nausea, vomiting, or diarrhea. 12/24/2019 upon evaluation today patient appears to be doing somewhat poorly still in regard to her foot. Unfortunately this is just not showing the signs of improvement that I would like to see it is a little bit cleaner but also appears to be erythematous and I believe there is infection noted at this point. We will get a need to address this in my opinion. I did actually recommend that we switch back to the Iodoflex as well apparently the home health nurse had switch this away that not really what we want to do at this point in my opinion. They were using silver alginate I think most recently. 01/06/2020 upon evaluation today patient actually appears to be doing much better in regard to her wound. We have switched back to the silver alginate after her home health nurse contacted me and honestly I feel like she is doing quite well in that regard. She does have a lot of drainage and therefore I think that is probably a better option for. With that being said she is tolerating the compression wrap without complication that RACHANA, MALESKY. ([phone]) also seems to be helping. 01/28/2020 on evaluation today patient appears to be doing well with regard to her foot ulcer. I do feel like she is showing signs of improvement which is great news. There is no signs of active infection at this time which is also good news. No fevers, chills, nausea, vomiting, or diarrhea. Unfortunately she did have a fall and sustained an injury to the left upper arm. She has a skin tear here  this appears to be doing okay although the dressing was stuck to this this was an alginate dressing. I really think she probably needs something like Xeroform to try to prevent anything from sticking to this region. Other than that she seems to be doing quite well. 02/11/2020 on evaluation today  patient's arm skin tear appears to be completely healed which is great. Unfortunately she is still having issues at this time with her foot although this seems to be showing signs of improvement it still very slow. There is no signs of active infection at this time. 03/06/2020 upon evaluation today patient appears to be doing decently well in regard to her foot ulcer. There is some minimal slough noted on the surface of the wound building up at this point. Otherwise I really feel like she is doing quite nicely with quite a bit of new granulation and epithelial growth. 03/23/2020 upon evaluation today patient appears to be doing well with regards to her wound. She has been tolerating the dressing changes without complication. Fortunately there is no evidence of active infection and overall feel like her foot ulcer is doing excellent at this point. 04/13/2020 on evaluation today patient appears to be doing well with regard to her foot ulcer. She has been tolerating the dressing changes without complication. Fortunately there is no signs of active infection at this time. Overall I feel like the wound is getting close to complete closure and this appears to be doing quite well in my opinion. 04/27/2020 on evaluation today patient appears to be doing excellent in regard to her wound. She has been tolerating the dressing changes without complication. Fortunately there is no signs of active infection and overall very pleased with where things stand today. In fact I feel like the wound is very close to complete resolution. 05/18/2020 upon evaluation today patient appears to be doing well with regard to her foot ulcer. There is no evidence of active infection at this time which is great news and overall very pleased with where things stand. No fevers, chills, nausea, vomiting, or diarrhea. I do feel like she is making great progress and I feel like that the wound is very tiny but again this last portion getting  close has been somewhat difficult. 07/05/2020 upon evaluation today patient appears to be doing a little poorly in regard to her foot ulcer. Fortunately there is no signs of active infection at this time systemically though locally I feel it may be some cellulitis it looks like this wound may have prematurely sealed up subsequently causing a blister that had to be removed. The good news is that I do not think it is a very bad infection but I believe she may be experiencing cellulitis. 06/16/2020 upon evaluation today patient appears to be doing well with regard to her wound. It seems like it showed signs of improvement compared to last week where she had more infection noted. With that being said I do believe that based on what I am seeing the patient seems to be making excellent progress although I think still this is a small area that is going require some debridement to clear away some of the callus and drainage which is trapping fluid behind it based on what I see. 06/23/2020 upon evaluation today patient appears to be doing well with regard to her leg ulcer. She has been tolerating the dressing changes without application. And currently I do believe the alginate is doing a good job keeping it dry it still  tends to cover over and we do not want her trapping any fluid therefore I do remove that outer callus/drainage covering each time she comes in. Overall I think this is doing a great job. 06/30/2020 patient appears to be doing okay in regard to pain in regard to her wound. Unfortunately she continues to develop a callus buildup over the area. This is unfortunate as to be honest it is continuing to cause problems with her basically being able to heal as she needs to really granulate in from the bottom out on that is not happening. I think we need to find something that works better to keep this area open. 07/10/2020 upon evaluation today patient unfortunately appears to be having increased pain yet  again and is not doing nearly as well as she was previous. I do believe this is indication of infection. The patient is going to require a culture today it appears. 07/18/2020 upon evaluation today patient appears to be doing well with regard to her wounds. She has been tolerating the dressing changes without complication. In fact this appears to be doing significantly better compared to last week. I did review her however her pathology which showed acute osteomyelitis. Also did review the culture which showed Staph aureus. She is on Bactrim that does seem to be helping. I am unlikely going to continue that for a total of 2 months. I think they would prefer that over going to infectious disease. I am definitely okay with that as far as that is concerned. I am going to likely go ahead and refill the Bactrim next week for another 2 weeks supply in order to get the patient through for this month and then will have to do 1 more month for a full 8 weeks of treatment. Several of the times that I sent patients to infectious disease they just kept the patient on oral antibiotic therapy as opposed to switching IV anyway and she seems to be doing very well after just 1 week. 07/25/2020 upon evaluation today patient appears to be doing well with regard to her wound. She still has an opening and there is still some depth there as well. With that being said I do think that the patient does have osteomyelitis and very well could be a candidate for hyperbaric oxygen therapy. However I Georgina Peer see how she does over the next 2 weeks with the oral antibiotics. Obviously if she is not close by that time I think proceeding with the HBO therapy would be appropriate. I did discuss this with the patient and her daughter today. I think that this may be something to strongly consider going forward if things do not improve over again the next 2 weeks. 08/08/2020 upon evaluation today patient appears to be doing decently well in  regard to her wound. The good news is she does not appear to have any signs of infection worsening which is good she still has a small hole open here in the [facility] I think is still appropriate to try to let this granulate in from the bottom out. With that being said this is getting tiny but nonetheless is not completely resolving. Reason I did discuss the possibility of hyperbaric oxygen therapy with the patient and her daughter today again. [name], [name] ([id number]) Objective Constitutional Well-nourished and well-hydrated in no acute distress. Vitals Time Taken: 11:16 AM, Height: 62 in, Weight: 140 lbs, BMI: 25.6, Temperature: 98.2 F, Pulse: 76 bpm, Respiratory Rate: 18 breaths/min, Blood Pressure: 192/83 mmHg.  Respiratory normal breathing without difficulty. Psychiatric this patient is able to make decisions and demonstrates good insight into disease process. Alert and Oriented x 3. pleasant and cooperative. General Notes: Upon inspection patient's wound showed signs of good granulation epithelization at this point. She still has an opening at the central portion of the wound but still quite a bit of depth there. Fortunately there does not however appear to be any evidence of infection at this time which is good news. Integumentary (Hair, Skin) Wound #3 status is Open. Original cause of wound was Gradually Appeared. The date acquired was: 04/09/2019. The wound has been in treatment 43 weeks. The wound is located on the Right Metatarsal head fifth. The wound measures 0.1cm length x 0.1cm width x 0.2cm depth; 0.008cm^2 area and 0.002cm^3 volume. There is Fat Layer (Subcutaneous Tissue) exposed. There is no tunneling or undermining noted. There is a small amount of serosanguineous drainage noted. The wound margin is distinct with the outline attached to the wound base. There is large (67-100%) red, pink granulation within the wound bed. There is no necrotic tissue within the wound  bed. Assessment Active Problems ICD-10 Other specified peripheral vascular diseases Non-pressure chronic ulcer of other part of right foot with fat layer exposed Other chronic osteomyelitis, right ankle and foot Long term (current) use of anticoagulants Paroxysmal atrial fibrillation Tinea pedis Plan Follow-up Appointments: Return Appointment in 1 week. Home Health: [place]: - [facility] for wound care. May utilize formulary equivalent dressing for wound treatment orders unless otherwise specified. Home Health Nurse may visit PRN to address patient s wound care needs. Scheduled days for dressing changes to be completed; exception, patient has scheduled wound care visit that day. **Please direct any NON-WOUND related issues/requests for orders to patient's Primary Care Physician. **If current dressing causes regression in wound condition, may D/C ordered dressing product/s and apply Normal Saline Moist Dressing daily until next [place] or Other MD appointment. **Notify [facility] of regression in wound condition at [phone]. Hyperbaric Oxygen Therapy: Evaluate for HBO Therapy - Discussed in office today, consider this option Medications-Please add to medication list.: P.O. Antibiotics - Doxycycline x 2 weeks The following medication(s) was prescribed: doxycycline hyclate oral 100 mg capsule 1 1 capsule oral taken 2 times per day for 30 days starting 08/09/2020 WOUND #3: - Metatarsal head fifth Wound Laterality: Right Cleanser: Normal Saline (Generic) 2 x Per Week/30 Days Discharge Instructions: Wash your hands with soap and water. Remove old dressing, discard into plastic bag and place into trash. Cleanse the wound with Normal Saline prior to applying a clean dressing using gauze sponges, not tissues or cotton balls. Do not scrub or use excessive force. Pat dry using gauze sponges, not tissue or cotton balls. Primary Dressing:  Hydrofera Blue Classic Foam Rope Dressing, 9x6 (mm/in) 2 x Per Week/30 Days Discharge Instructions: Cut in half, about 2 cm length-pack in wound-put in a couple drops of saline (extras sent with patient) [name], [name] ([id number]) Secondary Dressing: Mepilex Border Flex, 4x4 (in/in) (Generic) 2 x Per Week/30 Days Discharge Instructions: Apply to wound as directed. Do not cut. 1. Would recommend currently that we going to continue with the Hydrofera Blue rope cut small enough to fit down in the area and then moistened afterwards. 2. I am also can recommend that we continue with the border foam dressing to cover which I think is doing a great job. 3. They will continue to monitor for any signs  of infection if anything worsens that she let me know soon as possible. 4. We did discuss again hyperbaric oxygen therapy I still think this may be ideal for the patient considering how long this has been going on to try to make sure we get rid of the infection and completely heal this wound. With that being said I am not certain they can make that decision for HBO therapy there did not talk about it and let me know more next week. We will see patient back for reevaluation in 1 week here in the clinic. If anything worsens or changes patient will contact our office for additional recommendations. Electronic Signature(s) Signed: 08/09/2020 6:41:22 PM By: Worthy Keeler PA-C Previous Signature: 08/08/2020 4:53:04 PM Version By: Worthy Keeler PA-C Entered By: Worthy Keeler on 08/09/2020 18:41:21 DEBAR, ACHA ([id number]) -------------------------------------------------------------------------------- SuperBill Details Patient Name: [name] Date of Service: 08/08/2020 Medical Record Number: [id number] Patient Account Number: [id number] Date of Birth/Sex: 11/22/1929 (87 y.o. F) Treating RN: Dolan Amen Primary Care Provider: Ria Bush Other Clinician: Jeanine Luz Referring  Provider: Ria Bush Treating Provider/Extender: Skipper Cliche in Treatment: 43 Diagnosis Coding ICD-10 Codes Code Description I73.89 Other specified peripheral vascular diseases L97.512 Non-pressure chronic ulcer of other part of right foot with fat layer exposed M86.671 Other chronic osteomyelitis, right ankle and foot Z79.01 Long term (current) use of anticoagulants I48.0 Paroxysmal atrial fibrillation B35.3 Tinea pedis Facility Procedures CPT4 Code: [id number] Description: 99213 - WOUND CARE VISIT-LEV 3 EST PT Modifier: Quantity: 1 Physician Procedures CPT4 Code: [id number] Description: [id number] - WC PHYS LEVEL 4 - EST PT Modifier: Quantity: 1 CPT4 Code: Description: ICD-10 Diagnosis Description I73.89 Other specified peripheral vascular diseases L97.512 Non-pressure chronic ulcer of other part of right foot with fat layer e M86.671 Other chronic osteomyelitis, right ankle and foot Z79.01 Long term  (current) use of anticoagulants Modifier: xposed Quantity: Electronic Signature(s) Signed: 08/08/2020 4:53:42 PM By: Worthy Keeler PA-C Previous Signature: 08/08/2020 4:05:56 PM Version By: Georges Mouse, Minus Breeding RN Entered By: Worthy Keeler on 08/08/2020 16:53:41

## 2020-08-09 ENCOUNTER — Ambulatory Visit: Payer: Medicare Other | Admitting: Dermatology

## 2020-08-09 DIAGNOSIS — L820 Inflamed seborrheic keratosis: Secondary | ICD-10-CM | POA: Diagnosis not present

## 2020-08-09 DIAGNOSIS — C44519 Basal cell carcinoma of skin of other part of trunk: Secondary | ICD-10-CM

## 2020-08-09 DIAGNOSIS — Z85828 Personal history of other malignant neoplasm of skin: Secondary | ICD-10-CM | POA: Diagnosis not present

## 2020-08-09 DIAGNOSIS — D1800 Hemangioma unspecified site: Secondary | ICD-10-CM | POA: Diagnosis not present

## 2020-08-09 DIAGNOSIS — L570 Actinic keratosis: Secondary | ICD-10-CM | POA: Diagnosis not present

## 2020-08-09 DIAGNOSIS — L821 Other seborrheic keratosis: Secondary | ICD-10-CM | POA: Diagnosis not present

## 2020-08-09 DIAGNOSIS — L89301 Pressure ulcer of unspecified buttock, stage 1: Secondary | ICD-10-CM | POA: Diagnosis not present

## 2020-08-09 DIAGNOSIS — B351 Tinea unguium: Secondary | ICD-10-CM

## 2020-08-09 DIAGNOSIS — D229 Melanocytic nevi, unspecified: Secondary | ICD-10-CM | POA: Diagnosis not present

## 2020-08-09 DIAGNOSIS — L814 Other melanin hyperpigmentation: Secondary | ICD-10-CM | POA: Diagnosis not present

## 2020-08-09 DIAGNOSIS — B353 Tinea pedis: Secondary | ICD-10-CM

## 2020-08-09 DIAGNOSIS — Z1283 Encounter for screening for malignant neoplasm of skin: Secondary | ICD-10-CM | POA: Diagnosis not present

## 2020-08-09 DIAGNOSIS — D489 Neoplasm of uncertain behavior, unspecified: Secondary | ICD-10-CM

## 2020-08-09 DIAGNOSIS — L578 Other skin changes due to chronic exposure to nonionizing radiation: Secondary | ICD-10-CM

## 2020-08-09 MED ORDER — CICLOPIROX OLAMINE 0.77 % EX CREA: TOPICAL_CREAM | Freq: Two times a day (BID) | CUTANEOUS | 4 refills | Status: DC

## 2020-08-09 MED ORDER — CICLOPIROX OLAMINE 0.77 % EX CREA
TOPICAL_CREAM | Freq: Two times a day (BID) | CUTANEOUS | 4 refills | Status: DC
Start: 2020-08-09 — End: 2020-08-09

## 2020-08-09 NOTE — Progress Notes (Signed)
New Patient Visit  Subjective  [name] is a 87 y.o. female who presents for the following: New Patient (Initial Visit) (Patient here today as new patient. She states she has had mohs surgery on nose. She has a history of skin cancer. Patient has skin lesion on chest, face below right eye. Patient would like to have back checked as well. Patient's daughter reports spot at chest has been there for about a year. Patient noticed spot on face about a few months ago. ).  Patient here for full body skin exam and skin cancer screening.   Objective  Well appearing patient in no apparent distress; mood and affect are within normal limits.  A full examination was performed including scalp, head, eyes, ears, nose, lips, neck, chest, axillae, abdomen, back, buttocks, bilateral upper extremities, bilateral lower extremities, hands, feet, fingers, toes, fingernails, and toenails. All findings within normal limits unless otherwise noted below.  Objective  upper chest: 0.9 cm firm pink papule upper chest        Objective  left cheek x 2 (2): Erythematous thin papules/macules with gritty scale.   Objective  Right Lower Eyelid x 1: Erythematous keratotic or waxy stuck-on papule or plaque.   Objective  bilateral feet and toenails: Scaling and maceration web spaces and over distal and lateral soles.   Assessment & Plan  Neoplasm of uncertain behavior upper chest  Skin / nail biopsy Type of biopsy: tangential   Informed consent: discussed and consent obtained   Patient was prepped and draped in usual sterile fashion: Area prepped with alcohol. Anesthesia: the lesion was anesthetized in a standard fashion   Anesthetic:  1% lidocaine w/ epinephrine 1-100,000 buffered w/ 8.4% NaHCO3 Instrument used: flexible razor blade   Hemostasis achieved with: pressure, aluminum chloride and electrodesiccation   Outcome: patient tolerated procedure well   Post-procedure details: wound care  instructions given   Post-procedure details comment:  Ointment and small bandage applied  Specimen 1 - Surgical pathology Differential Diagnosis: r/o bcc  Check Margins: No 0.9 cm firm pink papule upper chest  R/o bcc   Actinic keratosis (2) left cheek x 2  Prior to procedure, discussed risks of blister formation, small wound, skin dyspigmentation, or rare scar following cryotherapy.    Destruction of lesion - left cheek x 2  Destruction method: cryotherapy   Informed consent: discussed and consent obtained   Lesion destroyed using liquid nitrogen: Yes   Cryotherapy cycles:  2 Outcome: patient tolerated procedure well with no complications   Post-procedure details: wound care instructions given    Inflamed seborrheic keratosis Right Lower Eyelid x 1  Prior to procedure, discussed risks of blister formation, small wound, skin dyspigmentation, or rare scar following cryotherapy.    Destruction of lesion - Right Lower Eyelid x 1  Destruction method: cryotherapy   Informed consent: discussed and consent obtained   Lesion destroyed using liquid nitrogen: Yes   Cryotherapy cycles:  2 Outcome: patient tolerated procedure well with no complications   Post-procedure details: wound care instructions given    Tinea pedis of both feet bilateral feet and toenails  With onychomycosis. Defer treatment of onychomycosis given risk of liver damage with PO therapy.   Expected duration of tinea pedis over 1 year and recurrent due to concurrent onychomycosis  Start ciclopirox 0.77 % cream apply topically 2 times daily to entire foot including toenails of both feet until rash clear, then use weekly to prevent recurrence.    Reordered Medications ciclopirox (  LOPROX) 0.77 % cream  Pressure injury of buttock, stage 1, unspecified laterality buttock  Recommend for Stage 1 pressure ulcer at buttock recommend REPOSITIONING frequently through the day, pillows or cushions to avoid  pressure there and otc zinc oxide diaper paste (Triple Paste is a good brand).   Lentigines - Scattered tan macules - Due to sun exposure - Benign-appering, observe - Recommend daily broad spectrum sunscreen SPF 30+ to sun-exposed areas, reapply every 2 hours as needed. - Call for any changes   Seborrheic Keratoses - Stuck-on, waxy, tan-brown papules and/or plaques at right side of face   - Benign-appearing - Discussed benign etiology and prognosis. - Observe - Call for any changes  Melanocytic Nevi - Tan-brown and/or pink-flesh-colored symmetric macules and papules - Benign appearing on exam today - Observation - Call clinic for new or changing moles - Recommend daily use of broad spectrum spf 30+ sunscreen to sun-exposed areas.   Hemangiomas - Red papules - Discussed benign nature - Observe - Call for any changes  Actinic Damage - Chronic condition, secondary to cumulative UV/sun exposure - diffuse scaly erythematous macules with underlying dyspigmentation - Recommend daily broad spectrum sunscreen SPF 30+ to sun-exposed areas, reapply every 2 hours as needed.  - Staying in the shade or wearing long sleeves, sun glasses (UVA+UVB protection) and wide brim hats (4-inch brim around the entire circumference of the hat) are also recommended for sun protection.  - Call for new or changing lesions.  History of  Skin Cancer  - No evidence of recurrence today removed at status post mohs about 5 years ago at left nose  - No lymphadenopathy - Recommend regular full body skin exams - Recommend daily broad spectrum sunscreen SPF 30+ to sun-exposed areas, reapply every 2 hours as needed.  - Call if any new or changing lesions are noted between office visits     Skin cancer screening performed today.  Return in about 3 months (around 11/09/2020) for ak followup at face .Garry Heater, CMA, am acting as scribe for Forest Gleason, MD.  Documentation: I have reviewed the above  documentation for accuracy and completeness, and I agree with the above.  Forest Gleason, MD

## 2020-08-09 NOTE — Patient Instructions (Addendum)
Cryotherapy Aftercare  . Wash gently with soap and water everyday.   Marland Kitchen Apply Vaseline and Band-Aid daily until healed.  Actinic keratoses are precancerous spots that appear secondary to cumulative UV radiation exposure/sun exposure over time. They are chronic with expected duration over 1 year. A portion of actinic keratoses will progress to squamous cell carcinoma of the skin. It is not possible to reliably predict which spots will progress to skin cancer and so treatment is recommended to prevent development of skin cancer.  Recommend daily broad spectrum sunscreen SPF 30+ to sun-exposed areas, reapply every 2 hours as needed.  Recommend staying in the shade or wearing long sleeves, sun glasses (UVA+UVB protection) and wide brim hats (4-inch brim around the entire circumference of the hat). Call for new or changing lesions.  Melanoma ABCDEs  Melanoma is the most dangerous type of skin cancer, and is the leading cause of death from skin disease.  You are more likely to develop melanoma if you:  Have light-colored skin, light-colored eyes, or red or blond hair  Spend a lot of time in the sun  Tan regularly, either outdoors or in a tanning bed  Have had blistering sunburns, especially during childhood  Have a close family member who has had a melanoma  Have atypical moles or large birthmarks  Early detection of melanoma is key since treatment is typically straightforward and cure rates are extremely high if we catch it early.   The first sign of melanoma is often a change in a mole or a new dark spot.  The ABCDE system is a way of remembering the signs of melanoma.  A for asymmetry:  The two halves do not match. B for border:  The edges of the growth are irregular. C for color:  A mixture of colors are present instead of an even brown color. D for diameter:  Melanomas are usually (but not always) greater than 68mm - the size of a pencil eraser. E for evolution:  The spot keeps  changing in size, shape, and color.  Please check your skin once per month between visits. You can use a small mirror in front and a large mirror behind you to keep an eye on the back side or your body.   If you see any new or changing lesions before your next follow-up, please call to schedule a visit.  Please continue daily skin protection including broad spectrum sunscreen SPF 30+ to sun-exposed areas, reapplying every 2 hours as needed when you're outdoors.   Staying in the shade or wearing long sleeves, sun glasses (UVA+UVB protection) and wide brim hats (4-inch brim around the entire circumference of the hat) are also recommended for sun protection.   Recommend for Stage 1 pressure ulcer at buttock recommend REPOSITIONING frequently through the day, pillows or cushions to avoid pressure there and otc zinc oxide diaper paste (Triple Paste is a good brand).  Biopsy Wound Care Instructions  1. Leave the original bandage on for 24 hours if possible.  If the bandage becomes soaked or soiled before that time, it is OK to remove it and examine the wound.  A small amount of post-operative bleeding is normal.  If excessive bleeding occurs, remove the bandage, place gauze over the site and apply continuous pressure (no peeking) over the area for 30 minutes. If this does not work, please call our clinic as soon as possible or page your doctor if it is after hours.   2. Once a day, cleanse  the wound with soap and water. It is fine to shower. If a thick crust develops you may use a Q-tip dipped into dilute hydrogen peroxide (mix 1:1 with water) to dissolve it.  Hydrogen peroxide can slow the healing process, so use it only as needed.    3. After washing, apply petroleum jelly (Vaseline) or an antibiotic ointment if your doctor prescribed one for you, followed by a bandage.    4. For best healing, the wound should be covered with a layer of ointment at all times. If you are not able to keep the area  covered with a bandage to hold the ointment in place, this may mean re-applying the ointment several times a day.  Continue this wound care until the wound has healed and is no longer open.   Itching and mild discomfort is normal during the healing process. However, if you develop pain or severe itching, please call our office.   If you have any discomfort, you can take Tylenol (acetaminophen) or ibuprofen as directed on the bottle. (Please do not take these if you have an allergy to them or cannot take them for another reason).  Some redness, tenderness and white or yellow material in the wound is normal healing.  If the area becomes very sore and red, or develops a thick yellow-green material (pus), it may be infected; please notify us.    If you have stitches, return to clinic as directed to have the stitches removed. You will continue wound care for 2-3 days after the stitches are removed.   Wound healing continues for up to one year following surgery. It is not unusual to experience pain in the scar from time to time during the interval.  If the pain becomes severe or the scar thickens, you should notify the office.    A slight amount of redness in a scar is expected for the first six months.  After six months, the redness will fade and the scar will soften and fade.  The color difference becomes less noticeable with time.  If there are any problems, return for a post-op surgery check at your earliest convenience.  To improve the appearance of the scar, you can use silicone scar gel, cream, or sheets (such as Mederma or Serica) every night for up to one year. These are available over the counter (without a prescription).  Please call our office at [phone] for any questions or concerns.   Fungal Infections of the Skin  Fungal infections can be spread from person to person through close personal contact.  The infection can be also be transmitted by sharing a towel, comb, or walking  barefoot on a comtaminated surface.  Some fungal infections can be acquired from animals or soil. Fungus thrives in warm, moist environments, therefore patients are most at risk for developing infections when their skin stays wet for long periods.  Pool decks, underwear, and shower tiles are common places fungus may grow.  Athlete's Foot (Tinea Pedis) Most people are infected by walking barefoot in a public place such as a locker room.  On the toes, the skin usually peels, itches, and flakes, but sometimes may blister. A dermatologist can help determine whether or not a patient has fungus.  It may look like other skin conditions such as dermatitis or psoriasis.  An antifungal cream often works well to relieve the burning and itching, and to clear the skin in mild cases of fungus.  In more severe cases, an oral anti-fungal  pill may be necessary.  You can take some precautions to prevent athlete's foot: 1. Wear sandals or flip-flops during the summer.  If you have to wear boots or shoes, sprinkle anti-fungal powder in them daily. 2. Do not walk barefoot in public places. 3. Avoid wearing other people's shoes. 4. Wash your feet daily. 5. Wear socks that wick away moisture.  Change them every day, and change your socks if they are damp.  Nail Fungus (Onychomycosis) A fungal infection of the nail can cause it to become thick and discolored.  Sometimes the nail crumbles or develops debris underneath.  Nail fungus tends to be more common in people with athlete's foot or who have an injured nail.  To cure this condition it may be necessary to treat with oral anti-fungal medications.  Treatment can be difficult, and successful treatment does not prevent future nail infections.  If you have any questions or concerns for your doctor, please call our main line at [phone] and press option 4 to reach your doctor's medical assistant. If no one answers, please leave a voicemail as directed and we will return  your call as soon as possible. Messages left after 4 pm will be answered the following business day.   You may also send [place] a message via [place]. We typically respond to MyChart messages within 1-2 business days.  For prescription refills, please ask your pharmacy to contact our office. Our fax number is [phone].  If you have an urgent issue when the clinic is closed that cannot wait until the next business day, you can page your doctor at the number below.    Please note that while we do our best to be available for urgent issues outside of office hours, we are not available 24/7.   If you have an urgent issue and are unable to reach [place], you may choose to seek medical care at your doctor's office, retail clinic, urgent care center, or emergency room.  If you have a medical emergency, please immediately call 911 or go to the emergency department.  Pager Numbers  - Dr. Nehemiah Massed: [phone]  - Dr. Laurence Ferrari: [phone]  - Dr. Nicole Kindred: [phone]  In the event of inclement weather, please call our main line at [phone] for an update on the status of any delays or closures.  Dermatology Medication Tips: Please keep the boxes that topical medications come in in order to help keep track of the instructions about where and how to use these. Pharmacies typically print the medication instructions only on the boxes and not directly on the medication tubes.   If your medication is too expensive, please contact our office at [phone] option 4 or send [place] a message through [place].   We are unable to tell what your co-pay for medications will be in advance as this is different depending on your insurance coverage. However, we may be able to find a substitute medication at lower cost or fill out paperwork to get insurance to cover a needed medication.   If a prior authorization is required to get your medication covered by your insurance company, please allow [place] 1-2 business days to  complete this process.  Drug prices often vary depending on where the prescription is filled and some pharmacies may offer cheaper prices.  The website www.goodrx.com contains coupons for medications through different pharmacies. The prices here do not account for what the cost may be with help from insurance (it may be cheaper with your insurance), but the  website can give you the price if you did not use any insurance.  - You can print the associated coupon and take it with your prescription to the pharmacy.  - You may also stop by our office during regular business hours and pick up a GoodRx coupon card.  - If you need your prescription sent electronically to a different pharmacy, notify our office through [facility] or by phone at [phone] option 4.

## 2020-08-10 DIAGNOSIS — N1831 Chronic kidney disease, stage 3a: Secondary | ICD-10-CM | POA: Diagnosis not present

## 2020-08-10 DIAGNOSIS — E538 Deficiency of other specified B group vitamins: Secondary | ICD-10-CM | POA: Diagnosis not present

## 2020-08-10 DIAGNOSIS — G9009 Other idiopathic peripheral autonomic neuropathy: Secondary | ICD-10-CM | POA: Diagnosis not present

## 2020-08-10 DIAGNOSIS — S62102D Fracture of unspecified carpal bone, left wrist, subsequent encounter for fracture with routine healing: Secondary | ICD-10-CM | POA: Diagnosis not present

## 2020-08-10 DIAGNOSIS — Z7982 Long term (current) use of aspirin: Secondary | ICD-10-CM | POA: Diagnosis not present

## 2020-08-10 DIAGNOSIS — G2581 Restless legs syndrome: Secondary | ICD-10-CM | POA: Diagnosis not present

## 2020-08-10 DIAGNOSIS — E559 Vitamin D deficiency, unspecified: Secondary | ICD-10-CM | POA: Diagnosis not present

## 2020-08-10 DIAGNOSIS — Z9181 History of falling: Secondary | ICD-10-CM | POA: Diagnosis not present

## 2020-08-10 DIAGNOSIS — M1990 Unspecified osteoarthritis, unspecified site: Secondary | ICD-10-CM | POA: Diagnosis not present

## 2020-08-10 DIAGNOSIS — M5136 Other intervertebral disc degeneration, lumbar region: Secondary | ICD-10-CM | POA: Diagnosis not present

## 2020-08-10 DIAGNOSIS — Z792 Long term (current) use of antibiotics: Secondary | ICD-10-CM | POA: Diagnosis not present

## 2020-08-10 DIAGNOSIS — L97513 Non-pressure chronic ulcer of other part of right foot with necrosis of muscle: Secondary | ICD-10-CM | POA: Diagnosis not present

## 2020-08-10 DIAGNOSIS — I872 Venous insufficiency (chronic) (peripheral): Secondary | ICD-10-CM | POA: Diagnosis not present

## 2020-08-10 DIAGNOSIS — I2510 Atherosclerotic heart disease of native coronary artery without angina pectoris: Secondary | ICD-10-CM | POA: Diagnosis not present

## 2020-08-10 DIAGNOSIS — Z7901 Long term (current) use of anticoagulants: Secondary | ICD-10-CM | POA: Diagnosis not present

## 2020-08-10 DIAGNOSIS — K219 Gastro-esophageal reflux disease without esophagitis: Secondary | ICD-10-CM | POA: Diagnosis not present

## 2020-08-10 DIAGNOSIS — I130 Hypertensive heart and chronic kidney disease with heart failure and stage 1 through stage 4 chronic kidney disease, or unspecified chronic kidney disease: Secondary | ICD-10-CM | POA: Diagnosis not present

## 2020-08-10 DIAGNOSIS — I5032 Chronic diastolic (congestive) heart failure: Secondary | ICD-10-CM | POA: Diagnosis not present

## 2020-08-10 DIAGNOSIS — I272 Pulmonary hypertension, unspecified: Secondary | ICD-10-CM | POA: Diagnosis not present

## 2020-08-10 DIAGNOSIS — E7849 Other hyperlipidemia: Secondary | ICD-10-CM | POA: Diagnosis not present

## 2020-08-10 DIAGNOSIS — I4891 Unspecified atrial fibrillation: Secondary | ICD-10-CM | POA: Diagnosis not present

## 2020-08-10 DIAGNOSIS — M810 Age-related osteoporosis without current pathological fracture: Secondary | ICD-10-CM | POA: Diagnosis not present

## 2020-08-14 ENCOUNTER — Other Ambulatory Visit: Payer: Self-pay | Admitting: Internal Medicine

## 2020-08-14 DIAGNOSIS — Z9181 History of falling: Secondary | ICD-10-CM | POA: Diagnosis not present

## 2020-08-14 DIAGNOSIS — M1990 Unspecified osteoarthritis, unspecified site: Secondary | ICD-10-CM | POA: Diagnosis not present

## 2020-08-14 DIAGNOSIS — S62102D Fracture of unspecified carpal bone, left wrist, subsequent encounter for fracture with routine healing: Secondary | ICD-10-CM | POA: Diagnosis not present

## 2020-08-14 DIAGNOSIS — M810 Age-related osteoporosis without current pathological fracture: Secondary | ICD-10-CM | POA: Diagnosis not present

## 2020-08-14 DIAGNOSIS — L97513 Non-pressure chronic ulcer of other part of right foot with necrosis of muscle: Secondary | ICD-10-CM | POA: Diagnosis not present

## 2020-08-14 DIAGNOSIS — E538 Deficiency of other specified B group vitamins: Secondary | ICD-10-CM | POA: Diagnosis not present

## 2020-08-14 DIAGNOSIS — K219 Gastro-esophageal reflux disease without esophagitis: Secondary | ICD-10-CM | POA: Diagnosis not present

## 2020-08-14 DIAGNOSIS — Z7901 Long term (current) use of anticoagulants: Secondary | ICD-10-CM | POA: Diagnosis not present

## 2020-08-14 DIAGNOSIS — E559 Vitamin D deficiency, unspecified: Secondary | ICD-10-CM | POA: Diagnosis not present

## 2020-08-14 DIAGNOSIS — I130 Hypertensive heart and chronic kidney disease with heart failure and stage 1 through stage 4 chronic kidney disease, or unspecified chronic kidney disease: Secondary | ICD-10-CM | POA: Diagnosis not present

## 2020-08-14 DIAGNOSIS — Z792 Long term (current) use of antibiotics: Secondary | ICD-10-CM | POA: Diagnosis not present

## 2020-08-14 DIAGNOSIS — I272 Pulmonary hypertension, unspecified: Secondary | ICD-10-CM | POA: Diagnosis not present

## 2020-08-14 DIAGNOSIS — M5136 Other intervertebral disc degeneration, lumbar region: Secondary | ICD-10-CM | POA: Diagnosis not present

## 2020-08-14 DIAGNOSIS — Z7982 Long term (current) use of aspirin: Secondary | ICD-10-CM | POA: Diagnosis not present

## 2020-08-14 DIAGNOSIS — I872 Venous insufficiency (chronic) (peripheral): Secondary | ICD-10-CM | POA: Diagnosis not present

## 2020-08-14 DIAGNOSIS — N1831 Chronic kidney disease, stage 3a: Secondary | ICD-10-CM | POA: Diagnosis not present

## 2020-08-14 DIAGNOSIS — E7849 Other hyperlipidemia: Secondary | ICD-10-CM | POA: Diagnosis not present

## 2020-08-14 DIAGNOSIS — G9009 Other idiopathic peripheral autonomic neuropathy: Secondary | ICD-10-CM | POA: Diagnosis not present

## 2020-08-14 DIAGNOSIS — I2510 Atherosclerotic heart disease of native coronary artery without angina pectoris: Secondary | ICD-10-CM | POA: Diagnosis not present

## 2020-08-14 DIAGNOSIS — G2581 Restless legs syndrome: Secondary | ICD-10-CM | POA: Diagnosis not present

## 2020-08-14 DIAGNOSIS — I5032 Chronic diastolic (congestive) heart failure: Secondary | ICD-10-CM | POA: Diagnosis not present

## 2020-08-14 DIAGNOSIS — I4891 Unspecified atrial fibrillation: Secondary | ICD-10-CM | POA: Diagnosis not present

## 2020-08-15 ENCOUNTER — Encounter: Payer: Self-pay | Admitting: Dermatology

## 2020-08-15 ENCOUNTER — Other Ambulatory Visit: Payer: Self-pay

## 2020-08-15 ENCOUNTER — Encounter: Payer: Medicare Other | Admitting: Physician Assistant

## 2020-08-15 DIAGNOSIS — Z7901 Long term (current) use of anticoagulants: Secondary | ICD-10-CM | POA: Diagnosis not present

## 2020-08-15 DIAGNOSIS — B353 Tinea pedis: Secondary | ICD-10-CM | POA: Diagnosis not present

## 2020-08-15 DIAGNOSIS — S51812A Laceration without foreign body of left forearm, initial encounter: Secondary | ICD-10-CM | POA: Diagnosis not present

## 2020-08-15 DIAGNOSIS — I480 Paroxysmal atrial fibrillation: Secondary | ICD-10-CM | POA: Diagnosis not present

## 2020-08-15 DIAGNOSIS — M86671 Other chronic osteomyelitis, right ankle and foot: Secondary | ICD-10-CM | POA: Diagnosis not present

## 2020-08-15 DIAGNOSIS — L97512 Non-pressure chronic ulcer of other part of right foot with fat layer exposed: Secondary | ICD-10-CM | POA: Diagnosis not present

## 2020-08-15 DIAGNOSIS — S51011A Laceration without foreign body of right elbow, initial encounter: Secondary | ICD-10-CM | POA: Diagnosis not present

## 2020-08-15 DIAGNOSIS — I739 Peripheral vascular disease, unspecified: Secondary | ICD-10-CM | POA: Diagnosis not present

## 2020-08-15 DIAGNOSIS — L98492 Non-pressure chronic ulcer of skin of other sites with fat layer exposed: Secondary | ICD-10-CM | POA: Diagnosis not present

## 2020-08-15 NOTE — Progress Notes (Addendum)
ASHELYNN, Payne ([id number]) Visit Report for 08/15/2020 Chief Complaint Document Details Patient Name: [name], [name]. Date of Service: 08/15/2020 11:15 AM Medical Record Number: [id number] Patient Account Number: [id number] Date of Birth/Sex: 1929/08/20 (87 y.o. F) Treating RN: Dolan Amen Primary Care Provider: Ria Bush Other Clinician: Referring Provider: Ria Bush Treating Provider/Extender: Skipper Cliche in Treatment: 68 Information Obtained from: Patient Chief Complaint Right Lateral Foot Ulcer and Right Elbow and Left Forearm Skin Tears Electronic Signature(s) Signed: 08/15/2020 11:46:49 AM By: Worthy Keeler PA-C Previous Signature: 08/15/2020 11:46:12 AM Version By: Worthy Keeler PA-C Entered By: Worthy Keeler on 08/15/2020 11:46:49 NOREENE, BOREMAN ([id number]) -------------------------------------------------------------------------------- HPI Details Patient Name: [name] Date of Service: 08/15/2020 11:15 AM Medical Record Number: [id number] Patient Account Number: [id number] Date of Birth/Sex: 1930-01-12 (87 y.o. F) Treating RN: Dolan Amen Primary Care Provider: Ria Bush Other Clinician: Referring Provider: Ria Bush Treating Provider/Extender: Skipper Cliche in Treatment: 84 History of Present Illness HPI Description: 10/12/2019 upon evaluation today patient appears to be doing somewhat poorly in regard to her wound at this point. She has a ulcer over the right fifth metatarsal region. This has been managed by podiatry up to this point. Unfortunately the patient just does not seem to be making the progress that they were hoping for an subsequently the patient was referred to [place] by [facility] Dr. Posey Pronto. With that being said I did note that the patient does have peripheral vascular disease which has been managed by Dr. Lazaro Arms her most recent angiogram with intervention was on 08/02/2019. The ABIs  following were on the right 1.30 with a TBI of 0.61 normal left 1.31 with a TBI of 0.58. Obviously things seem to be doing somewhat better at this point hopefully enough to allow her to heal. She is on anticoagulant therapy due to atrial fibrillation long-term. With that being said the patient is unfortunately having quite a bit of discomfort at this time secondary to the wound. She also has erythema which is very likely secondary to infection. I am going obtain a culture of the area today once debridement is complete. Apparently the patient also had a x-ray that was performed by Dr. Posey Pronto on 09/21/2019 and this showed that the patient had no obvious signs on x-ray of cortical irregularity and no osteomyelitis obviously noted. With that being said as I discussed with the patient's daughter as well this does not indicate that the patient absolutely has no osteomyelitis as x-rays are only at best 50% helpful in identifying this complication. Nonetheless I do believe that we need to have the patient continue to have this further evaluated specifically I am to recommend that we check into an MRI which I think would be ideal to evaluate whether or not there is osteomyelitis present. 7/16; this is a patient with an ulcer over the right fifth metatarsal head. She follows with Dr. Lucky Cowboy [facility]. She is due to have an MRI of the foot on 7/26. Using Santyl to the wound 11/04/2019 on evaluation today patient appears to be doing okay in regard to her wound at this time. Unfortunately since I last saw her which was actually her second visit here in the clinic she has moved into a new apartment and then subsequently had a fall. She ended up in the hospital she did fracture her left arm and subsequently has also been dealing with a lot of dizziness she has been at [employer].  She tells me that getting in the change the dressing has been somewhat difficult as far as daily dressing changes are  concerned with the Santyl. Nonetheless when they do change it that is what is been used and her daughter-in-law has also been coming in to help out as well with the dressing changes to try to make sure it gets done daily. Fortunately there is no signs of active infection at this time. No fever chills noted. I do believe the Santyl has been beneficial and is likely still the best way to go at this point for the patient. I did review the patient's MRI which showed some potential for reactive osteitis but did not show any signs of osteomyelitis overtly. That cannot be excluded but again right now it does not appear that is very likely present as well. This is good news. I also did review her culture she has been on the Bactrim she tells me she has taken this and again that was appropriate based on the results of her culture. 11/19/2019 upon evaluation today patient appears to be doing decently well in regard to her wound. Fortunately there is no signs of active infection at this time wound is not significantly larger in fact is roughly about the same at this point. With that being said there is not as much erythema as previously noted I think the infection seems to be under much better control which is great news. Unfortunately she has fallen since I last saw her and broken her left arm she has an appointment with them following the appointment with me. 8/25; patient was worked in acutely today at the request of home health and was concerned about her right heel, generalized erythema in her feet bilaterally. According to her daughter things have been getting worse over the last for 5 days but not precisely from a wound care point of view. Her wound is on the lateral part of the right fifth metatarsal head looks about the same as I remember this. She has developed dryness and flaking of the skin on her plantar feet. As well a very tender area localized over the insertion point of the Achilles tendon. She  has restless leg syndrome. Does not specifically offload the area at night. She wears socks at home and does not really give me a pressure on the right heel issue. I reviewed her vascular status. She underwent her last revascularization on 08/02/2019 by Dr. Lucky Cowboy. This included an angioplasty of the right peroneal artery as well as the right posterior tibial artery. Her last noninvasive arterial study was on 11/23/2019. On the right her ABI was 1.08 TBI of 0.48. On the left ABI at 1.21 with a TBI of 0.54. Family relates that Dr. Lucky Cowboy felt that he had done the best he could and this. We have been using Santyl to the one wound she has on the right lateral fifth metatarsal head. She does not have a new wound today which is the concern that letter coming into the clinic 12/09/2019 on evaluation today patient appears to be doing decently well in regard to her foot ulcer at this point. She does note that with the compression wrap that the home health nurse put on after contacting our clinic that she actually did much better and felt much better. Her heel is also improved. Fortunately there is no signs of active infection at this time. No fevers, chills, nausea, vomiting, or diarrhea. 12/24/2019 upon evaluation today patient appears to be doing somewhat  poorly still in regard to her foot. Unfortunately this is just not showing the signs of improvement that I would like to see it is a little bit cleaner but also appears to be erythematous and I believe there is infection noted at this point. We will get a need to address this in my opinion. I did actually recommend that we switch back to the Iodoflex as well apparently the home health nurse had switch this away that not really what we want to do at this point in my opinion. They were using silver alginate I think most recently. 01/06/2020 upon evaluation today patient actually appears to be doing much better in regard to her wound. We have switched back to the  silver alginate after her home health nurse contacted me and honestly I feel like she is doing quite well in that regard. She does have a lot of drainage and therefore I think that is probably a better option for. With that being said she is tolerating the compression wrap without complication that also seems to be helping. 01/28/2020 on evaluation today patient appears to be doing well with regard to her foot ulcer. I do feel like she is showing signs of improvement which is great news. There is no signs of active infection at this time which is also good news. No fevers, chills, nausea, vomiting, or diarrhea. Unfortunately she did have a fall and sustained an injury to the left upper arm. She has a skin tear here this appears to be doing okay although TIFFANEY, HEIMANN. ([id number]) the dressing was stuck to this this was an alginate dressing. I really think she probably needs something like Xeroform to try to prevent anything from sticking to this region. Other than that she seems to be doing quite well. 02/11/2020 on evaluation today patient's arm skin tear appears to be completely healed which is great. Unfortunately she is still having issues at this time with her foot although this seems to be showing signs of improvement it still very slow. There is no signs of active infection at this time. 03/06/2020 upon evaluation today patient appears to be doing decently well in regard to her foot ulcer. There is some minimal slough noted on the surface of the wound building up at this point. Otherwise I really feel like she is doing quite nicely with quite a bit of new granulation and epithelial growth. 03/23/2020 upon evaluation today patient appears to be doing well with regards to her wound. She has been tolerating the dressing changes without complication. Fortunately there is no evidence of active infection and overall feel like her foot ulcer is doing excellent at this point. 04/13/2020 on evaluation  today patient appears to be doing well with regard to her foot ulcer. She has been tolerating the dressing changes without complication. Fortunately there is no signs of active infection at this time. Overall I feel like the wound is getting close to complete closure and this appears to be doing quite well in my opinion. 04/27/2020 on evaluation today patient appears to be doing excellent in regard to her wound. She has been tolerating the dressing changes without complication. Fortunately there is no signs of active infection and overall very pleased with where things stand today. In fact I feel like the wound is very close to complete resolution. 05/18/2020 upon evaluation today patient appears to be doing well with regard to her foot ulcer. There is no evidence of active infection at this time which is great  news and overall very pleased with where things stand. No fevers, chills, nausea, vomiting, or diarrhea. I do feel like she is making great progress and I feel like that the wound is very tiny but again this last portion getting close has been somewhat difficult. 07/05/2020 upon evaluation today patient appears to be doing a little poorly in regard to her foot ulcer. Fortunately there is no signs of active infection at this time systemically though locally I feel it may be some cellulitis it looks like this wound may have prematurely sealed up subsequently causing a blister that had to be removed. The good news is that I do not think it is a very bad infection but I believe she may be experiencing cellulitis. 06/16/2020 upon evaluation today patient appears to be doing well with regard to her wound. It seems like it showed signs of improvement compared to last week where she had more infection noted. With that being said I do believe that based on what I am seeing the patient seems to be making excellent progress although I think still this is a small area that is going require some debridement to  clear away some of the callus and drainage which is trapping fluid behind it based on what I see. 06/23/2020 upon evaluation today patient appears to be doing well with regard to her leg ulcer. She has been tolerating the dressing changes without application. And currently I do believe the alginate is doing a good job keeping it dry it still tends to cover over and we do not want her trapping any fluid therefore I do remove that outer callus/drainage covering each time she comes in. Overall I think this is doing a great job. 06/30/2020 patient appears to be doing okay in regard to pain in regard to her wound. Unfortunately she continues to develop a callus buildup over the area. This is unfortunate as to be honest it is continuing to cause problems with her basically being able to heal as she needs to really granulate in from the bottom out on that is not happening. I think we need to find something that works better to keep this area open. 07/10/2020 upon evaluation today patient unfortunately appears to be having increased pain yet again and is not doing nearly as well as she was previous. I do believe this is indication of infection. The patient is going to require a culture today it appears. 07/18/2020 upon evaluation today patient appears to be doing well with regard to her wounds. She has been tolerating the dressing changes without complication. In fact this appears to be doing significantly better compared to last week. I did review her however her pathology which showed acute osteomyelitis. Also did review the culture which showed Staph aureus. She is on Bactrim that does seem to be helping. I am unlikely going to continue that for a total of 2 months. I think they would prefer that over going to infectious disease. I am definitely okay with that as far as that is concerned. I am going to likely go ahead and refill the Bactrim next week for another 2 weeks supply in order to get the patient  through for this month and then will have to do 1 more month for a full 8 weeks of treatment. Several of the times that I sent patients to infectious disease they just kept the patient on oral antibiotic therapy as opposed to switching IV anyway and she seems to be doing very well after  just 1 week. 07/25/2020 upon evaluation today patient appears to be doing well with regard to her wound. She still has an opening and there is still some depth there as well. With that being said I do think that the patient does have osteomyelitis and very well could be a candidate for hyperbaric oxygen therapy. However I Georgina Peer see how she does over the next 2 weeks with the oral antibiotics. Obviously if she is not close by that time I think proceeding with the HBO therapy would be appropriate. I did discuss this with the patient and her daughter today. I think that this may be something to strongly consider going forward if things do not improve over again the next 2 weeks. 08/08/2020 upon evaluation today patient appears to be doing decently well in regard to her wound. The good news is she does not appear to have any signs of infection worsening which is good she still has a small hole open here in the [facility] I think is still appropriate to try to let this granulate in from the bottom out. With that being said this is getting tiny but nonetheless is not completely resolving. Reason I did discuss the possibility of hyperbaric oxygen therapy with the patient and her daughter today again. 08/15/2020 upon evaluation today patient appears to be doing well with regard to her foot ulcer. In fact I am very pleased with where things stand in that regard. I am not even certain she is going to have to have the hyperbarics although this still something to consider depending on how things go. Overall the biggest issue today is she actually had a fall over top of her walker causing several contusions over the upper and  lower extremities unfortunately she does have a new skin tear 1 in the right elbow location and one on the left forearm. Electronic Signature(s) Signed: 08/15/2020 1:44:16 PM By: Worthy Keeler PA-C Entered By: Worthy Keeler on 08/15/2020 13:44:16 [name] ([id number]) -------------------------------------------------------------------------------- Physical Exam Details Patient Name: CATHALENE, SINCLAIR. Date of Service: 08/15/2020 11:15 AM Medical Record Number: [id number] Patient Account Number: [id number] Date of Birth/Sex: October 23, 1929 (87 y.o. F) Treating RN: Dolan Amen Primary Care Provider: Ria Bush Other Clinician: Referring Provider: Ria Bush Treating Provider/Extender: Skipper Cliche in Treatment: 40 Constitutional Well-nourished and well-hydrated in no acute distress. Respiratory normal breathing without difficulty. Psychiatric this patient is able to make decisions and demonstrates good insight into disease process. Alert and Oriented x 3. pleasant and cooperative. Notes Upon inspection patient's wound bed showed signs of good granulation epithelization at this point. There does not appear to be any evidence of infection which is great news. There overall is a good chance I think of the skin tear is healing without any debridement or additional procedures at this point. They seem to be doing okay. Electronic Signature(s) Signed: 08/15/2020 1:44:38 PM By: Worthy Keeler PA-C Entered By: Worthy Keeler on 08/15/2020 13:44:37 [name] ([id number]) -------------------------------------------------------------------------------- Physician Orders Details Patient Name: [name] Date of Service: 08/15/2020 11:15 AM Medical Record Number: [id number] Patient Account Number: [id number] Date of Birth/Sex: May 01, 1929 (87 y.o. F) Treating RN: Dolan Amen Primary Care Provider: Ria Bush Other Clinician: Referring Provider:  Ria Bush Treating Provider/Extender: Skipper Cliche in Treatment: 38 Verbal / Phone Orders: No Diagnosis Coding ICD-10 Coding Code Description I73.89 Other specified peripheral vascular diseases L97.512 Non-pressure chronic ulcer of other part of right foot with fat layer exposed M86.671 Other chronic  osteomyelitis, right ankle and foot S51.001A Unspecified open wound of right elbow, initial encounter S51.802A Unspecified open wound of left forearm, initial encounter Z79.01 Long term (current) use of anticoagulants I48.0 Paroxysmal atrial fibrillation B35.3 Tinea pedis Follow-up Appointments o Return Appointment in 1 week. [place]: - [place] for wound care. May utilize formulary equivalent dressing for wound treatment orders unless otherwise specified. Home Health Nurse may visit PRN to address patientos wound care needs. o Scheduled days for dressing changes to be completed; exception, patient has scheduled wound care visit that day. o **Please direct any NON-WOUND related issues/requests for orders to patient's Primary Care Physician. **If current dressing causes regression in wound condition, may D/C ordered dressing product/s and apply Normal Saline Moist Dressing daily until next [place] or Other MD appointment. **Notify [facility] of regression in wound condition at [phone]. Medications-Please add to medication list. o P.O. Antibiotics - Doxycycline x 2 weeks Wound Treatment Wound #3 - Metatarsal head fifth Wound Laterality: Right Cleanser: Normal Saline (Generic) 1 x Per Day/15 Days Discharge Instructions: Wash your hands with soap and water. Remove old dressing, discard into plastic bag and place into trash. Cleanse the wound with Normal Saline prior to applying a clean dressing using gauze sponges, not tissues or cotton balls. Do not scrub or use excessive force. Pat dry  using gauze sponges, not tissue or cotton balls. Topical: Mupirocin Ointment 1 x Per Day/15 Days Discharge Instructions: Apply thin layer to area Secondary Dressing: Mepilex Border Flex, 4x4 (in/in) (Generic) 1 x Per Day/15 Days Discharge Instructions: Apply to wound as directed. Do not cut. Wound #5 - Forearm Wound Laterality: Left, Anterior, Distal Cleanser: Normal Saline 1 x Per [id number] Days Discharge Instructions: Wash your hands with soap and water. Remove old dressing, discard into plastic bag and place into trash. Cleanse the wound with Normal Saline prior to applying a clean dressing using gauze sponges, not tissues or cotton balls. Do not scrub or use excessive force. Pat dry using gauze sponges, not tissue or cotton balls. Primary Dressing: Xeroform-HBD 2x2 (in/in) 1 x Per Day/15 Days Discharge Instructions: Apply Xeroform-HBD 2x2 (in/in) as directed Secondary Dressing: ABD Pad 5x9 (in/in) 1 x Per Day/15 Days Discharge Instructions: Cover with ABD pad Secured With: Stretch Net Size 10 (yds) 1 x Per Day/15 Days HOLDYN, DAS ([id number]) Discharge Instructions: To secure dressing in place. Wound #6 - Elbow Wound Laterality: Right, Lateral, Anterior Cleanser: Normal Saline 1 x Per Day/15 Days Discharge Instructions: Wash your hands with soap and water. Remove old dressing, discard into plastic bag and place into trash. Cleanse the wound with Normal Saline prior to applying a clean dressing using gauze sponges, not tissues or cotton balls. Do not scrub or use excessive force. Pat dry using gauze sponges, not tissue or cotton balls. Primary Dressing: Xeroform-HBD 2x2 (in/in) 1 x Per Day/15 Days Discharge Instructions: Apply Xeroform-HBD 2x2 (in/in) as directed Secondary Dressing: ABD Pad 5x9 (in/in) 1 x Per Day/15 Days Discharge Instructions: Cover with ABD pad Secured With: Stretch Net Size 10 (yds) 1 x Per Day/15 Days Discharge Instructions: To secure dressing in place. Patient  Medications Allergies: penicillin Notifications Medication Indication Start End mupirocin 08/15/2020 DOSE topical 2 % ointment - ointment topical Applied to the wound bed on the right lateral foot 1 time per day and cover with a Band-Aid x 1 month Electronic Signature(s) Signed: 08/15/2020 1:48:03 PM By: Worthy Keeler PA-C Previous Signature:  08/15/2020 1:26:33 PM Version By: Georges Mouse, Minus Breeding RN Entered By: Worthy Keeler on 08/15/2020 13:48:02 VANNESA, FOGELBERG ([id number]) -------------------------------------------------------------------------------- Problem List Details Patient Name: SHARRY, GERVASIO. Date of Service: 08/15/2020 11:15 AM Medical Record Number: [id number] Patient Account Number: [id number] Date of Birth/Sex: Apr 03, 1930 (87 y.o. F) Treating RN: Dolan Amen Primary Care Provider: Ria Bush Other Clinician: Referring Provider: Ria Bush Treating Provider/Extender: Skipper Cliche in Treatment: 19 Active Problems ICD-10 Encounter Code Description Active Date MDM Diagnosis I73.89 Other specified peripheral vascular diseases 10/12/2019 No Yes L97.512 Non-pressure chronic ulcer of other part of right foot with fat layer 10/12/2019 No Yes exposed M86.671 Other chronic osteomyelitis, right ankle and foot 07/25/2020 No Yes S51.001A Unspecified open wound of right elbow, initial encounter 08/15/2020 No Yes S51.802A Unspecified open wound of left forearm, initial encounter 08/15/2020 No Yes Z79.01 Long term (current) use of anticoagulants 10/12/2019 No Yes I48.0 Paroxysmal atrial fibrillation 10/12/2019 No Yes B35.3 Tinea pedis 12/01/2019 No Yes Inactive Problems Resolved Problems ICD-10 Code Description Active Date Resolved Date S41.102A Unspecified open wound of left upper arm, initial encounter 01/28/2020 01/28/2020 Electronic Signature(s) Signed: 08/15/2020 11:46:05 AM By: Worthy Keeler PA-C Entered By: Worthy Keeler on 08/15/2020  11:46:05 [name] ([id number]) -------------------------------------------------------------------------------- Progress Note Details Patient Name: [name]. Date of Service: 08/15/2020 11:15 AM Medical Record Number: [id number] Patient Account Number: [id number] Date of Birth/Sex: 1930/02/24 (87 y.o. F) Treating RN: Dolan Amen Primary Care Provider: Ria Bush Other Clinician: Referring Provider: Ria Bush Treating Provider/Extender: Skipper Cliche in Treatment: 97 Subjective Chief Complaint Information obtained from Patient Right Lateral Foot Ulcer and Right Elbow and Left Forearm Skin Tears History of Present Illness (HPI) 10/12/2019 upon evaluation today patient appears to be doing somewhat poorly in regard to her wound at this point. She has a ulcer over the right fifth metatarsal region. This has been managed by podiatry up to this point. Unfortunately the patient just does not seem to be making the progress that they were hoping for an subsequently the patient was referred to [place] by [facility] Dr. Posey Pronto. With that being said I did note that the patient does have peripheral vascular disease which has been managed by Dr. Lazaro Arms her most recent angiogram with intervention was on 08/02/2019. The ABIs following were on the right 1.30 with a TBI of 0.61 normal left 1.31 with a TBI of 0.58. Obviously things seem to be doing somewhat better at this point hopefully enough to allow her to heal. She is on anticoagulant therapy due to atrial fibrillation long-term. With that being said the patient is unfortunately having quite a bit of discomfort at this time secondary to the wound. She also has erythema which is very likely secondary to infection. I am going obtain a culture of the area today once debridement is complete. Apparently the patient also had a x-ray that was performed by Dr. Posey Pronto on 09/21/2019 and this showed that the patient had no  obvious signs on x-ray of cortical irregularity and no osteomyelitis obviously noted. With that being said as I discussed with the patient's daughter as well this does not indicate that the patient absolutely has no osteomyelitis as x-rays are only at best 50% helpful in identifying this complication. Nonetheless I do believe that we need to have the patient continue to have this further evaluated specifically I am to recommend that we check into an MRI which I think would be ideal to evaluate whether or  not there is osteomyelitis present. 7/16; this is a patient with an ulcer over the right fifth metatarsal head. She follows with Dr. Lucky Cowboy at [facility]. She is due to have an MRI of the foot on 7/26. Using Santyl to the wound 11/04/2019 on evaluation today patient appears to be doing okay in regard to her wound at this time. Unfortunately since I last saw her which was actually her second visit here in the clinic she has moved into a new apartment and then subsequently had a fall. She ended up in the hospital she did fracture her left arm and subsequently has also been dealing with a lot of dizziness she has been at [employer]. She tells me that getting in the change the dressing has been somewhat difficult as far as daily dressing changes are concerned with the Santyl. Nonetheless when they do change it that is what is been used and her daughter-in-law has also been coming in to help out as well with the dressing changes to try to make sure it gets done daily. Fortunately there is no signs of active infection at this time. No fever chills noted. I do believe the Santyl has been beneficial and is likely still the best way to go at this point for the patient. I did review the patient's MRI which showed some potential for reactive osteitis but did not show any signs of osteomyelitis overtly. That cannot be excluded but again right now it does not appear that is very likely present as well.  This is good news. I also did review her culture she has been on the Bactrim she tells me she has taken this and again that was appropriate based on the results of her culture. 11/19/2019 upon evaluation today patient appears to be doing decently well in regard to her wound. Fortunately there is no signs of active infection at this time wound is not significantly larger in fact is roughly about the same at this point. With that being said there is not as much erythema as previously noted I think the infection seems to be under much better control which is great news. Unfortunately she has fallen since I last saw her and broken her left arm she has an appointment with them following the appointment with me. 8/25; patient was worked in acutely today at the request of home health and was concerned about her right heel, generalized erythema in her feet bilaterally. According to her daughter things have been getting worse over the last for 5 days but not precisely from a wound care point of view. Her wound is on the lateral part of the right fifth metatarsal head looks about the same as I remember this. She has developed dryness and flaking of the skin on her plantar feet. As well a very tender area localized over the insertion point of the Achilles tendon. She has restless leg syndrome. Does not specifically offload the area at night. She wears socks at home and does not really give me a pressure on the right heel issue. I reviewed her vascular status. She underwent her last revascularization on 08/02/2019 by Dr. Lucky Cowboy. This included an angioplasty of the right peroneal artery as well as the right posterior tibial artery. Her last noninvasive arterial study was on 11/23/2019. On the right her ABI was 1.08 TBI of 0.48. On the left ABI at 1.21 with a TBI of 0.54. Family relates that Dr. Lucky Cowboy felt that he had done the best he could  and this. We have been using Santyl to the one wound she has on the right lateral  fifth metatarsal head. She does not have a new wound today which is the concern that letter coming into the clinic 12/09/2019 on evaluation today patient appears to be doing decently well in regard to her foot ulcer at this point. She does note that with the compression wrap that the home health nurse put on after contacting our clinic that she actually did much better and felt much better. Her heel is also improved. Fortunately there is no signs of active infection at this time. No fevers, chills, nausea, vomiting, or diarrhea. 12/24/2019 upon evaluation today patient appears to be doing somewhat poorly still in regard to her foot. Unfortunately this is just not showing the signs of improvement that I would like to see it is a little bit cleaner but also appears to be erythematous and I believe there is infection noted at this point. We will get a need to address this in my opinion. I did actually recommend that we switch back to the Iodoflex as well apparently the home health nurse had switch this away that not really what we want to do at this point in my opinion. They were using silver alginate I think most recently. 01/06/2020 upon evaluation today patient actually appears to be doing much better in regard to her wound. We have switched back to the silver alginate after her home health nurse contacted me and honestly I feel like she is doing quite well in that regard. She does have a lot of drainage and therefore I think that is probably a better option for. With that being said she is tolerating the compression wrap without complication that TYKIRA, HOUFEK. ([id number]) also seems to be helping. 01/28/2020 on evaluation today patient appears to be doing well with regard to her foot ulcer. I do feel like she is showing signs of improvement which is great news. There is no signs of active infection at this time which is also good news. No fevers, chills, nausea, vomiting, or diarrhea. Unfortunately  she did have a fall and sustained an injury to the left upper arm. She has a skin tear here this appears to be doing okay although the dressing was stuck to this this was an alginate dressing. I really think she probably needs something like Xeroform to try to prevent anything from sticking to this region. Other than that she seems to be doing quite well. 02/11/2020 on evaluation today patient's arm skin tear appears to be completely healed which is great. Unfortunately she is still having issues at this time with her foot although this seems to be showing signs of improvement it still very slow. There is no signs of active infection at this time. 03/06/2020 upon evaluation today patient appears to be doing decently well in regard to her foot ulcer. There is some minimal slough noted on the surface of the wound building up at this point. Otherwise I really feel like she is doing quite nicely with quite a bit of new granulation and epithelial growth. 03/23/2020 upon evaluation today patient appears to be doing well with regards to her wound. She has been tolerating the dressing changes without complication. Fortunately there is no evidence of active infection and overall feel like her foot ulcer is doing excellent at this point. 04/13/2020 on evaluation today patient appears to be doing well with regard to her foot ulcer. She has been tolerating the dressing  changes without complication. Fortunately there is no signs of active infection at this time. Overall I feel like the wound is getting close to complete closure and this appears to be doing quite well in my opinion. 04/27/2020 on evaluation today patient appears to be doing excellent in regard to her wound. She has been tolerating the dressing changes without complication. Fortunately there is no signs of active infection and overall very pleased with where things stand today. In fact I feel like the wound is very close to complete  resolution. 05/18/2020 upon evaluation today patient appears to be doing well with regard to her foot ulcer. There is no evidence of active infection at this time which is great news and overall very pleased with where things stand. No fevers, chills, nausea, vomiting, or diarrhea. I do feel like she is making great progress and I feel like that the wound is very tiny but again this last portion getting close has been somewhat difficult. 07/05/2020 upon evaluation today patient appears to be doing a little poorly in regard to her foot ulcer. Fortunately there is no signs of active infection at this time systemically though locally I feel it may be some cellulitis it looks like this wound may have prematurely sealed up subsequently causing a blister that had to be removed. The good news is that I do not think it is a very bad infection but I believe she may be experiencing cellulitis. 06/16/2020 upon evaluation today patient appears to be doing well with regard to her wound. It seems like it showed signs of improvement compared to last week where she had more infection noted. With that being said I do believe that based on what I am seeing the patient seems to be making excellent progress although I think still this is a small area that is going require some debridement to clear away some of the callus and drainage which is trapping fluid behind it based on what I see. 06/23/2020 upon evaluation today patient appears to be doing well with regard to her leg ulcer. She has been tolerating the dressing changes without application. And currently I do believe the alginate is doing a good job keeping it dry it still tends to cover over and we do not want her trapping any fluid therefore I do remove that outer callus/drainage covering each time she comes in. Overall I think this is doing a great job. 06/30/2020 patient appears to be doing okay in regard to pain in regard to her wound. Unfortunately she continues  to develop a callus buildup over the area. This is unfortunate as to be honest it is continuing to cause problems with her basically being able to heal as she needs to really granulate in from the bottom out on that is not happening. I think we need to find something that works better to keep this area open. 07/10/2020 upon evaluation today patient unfortunately appears to be having increased pain yet again and is not doing nearly as well as she was previous. I do believe this is indication of infection. The patient is going to require a culture today it appears. 07/18/2020 upon evaluation today patient appears to be doing well with regard to her wounds. She has been tolerating the dressing changes without complication. In fact this appears to be doing significantly better compared to last week. I did review her however her pathology which showed acute osteomyelitis. Also did review the culture which showed Staph aureus. She is on Bactrim that  does seem to be helping. I am unlikely going to continue that for a total of 2 months. I think they would prefer that over going to infectious disease. I am definitely okay with that as far as that is concerned. I am going to likely go ahead and refill the Bactrim next week for another 2 weeks supply in order to get the patient through for this month and then will have to do 1 more month for a full 8 weeks of treatment. Several of the times that I sent patients to infectious disease they just kept the patient on oral antibiotic therapy as opposed to switching IV anyway and she seems to be doing very well after just 1 week. 07/25/2020 upon evaluation today patient appears to be doing well with regard to her wound. She still has an opening and there is still some depth there as well. With that being said I do think that the patient does have osteomyelitis and very well could be a candidate for hyperbaric oxygen therapy. However I Georgina Peer see how she does over the next 2  weeks with the oral antibiotics. Obviously if she is not close by that time I think proceeding with the HBO therapy would be appropriate. I did discuss this with the patient and her daughter today. I think that this may be something to strongly consider going forward if things do not improve over again the next 2 weeks. 08/08/2020 upon evaluation today patient appears to be doing decently well in regard to her wound. The good news is she does not appear to have any signs of infection worsening which is good she still has a small hole open here in the [facility] I think is still appropriate to try to let this granulate in from the bottom out. With that being said this is getting tiny but nonetheless is not completely resolving. Reason I did discuss the possibility of hyperbaric oxygen therapy with the patient and her daughter today again. 08/15/2020 upon evaluation today patient appears to be doing well with regard to her foot ulcer. In fact I am very pleased with where things stand in that regard. I am not even certain she is going to have to have the hyperbarics although this still something to consider depending on how things go. Overall the biggest issue today is she actually had a fall over top of her walker causing several contusions over the upper and lower extremities unfortunately she does have a new skin tear 1 in the right elbow location and one on the left forearm. ALITHEA, AVILA ([id number]) Objective Constitutional Well-nourished and well-hydrated in no acute distress. Vitals Time Taken: 11:30 AM, Height: 62 in, Weight: 140 lbs, BMI: 25.6, Temperature: 98.2 F, Pulse: 59 bpm, Respiratory Rate: 18 breaths/min. Respiratory normal breathing without difficulty. Psychiatric this patient is able to make decisions and demonstrates good insight into disease process. Alert and Oriented x 3. pleasant and cooperative. General Notes: Upon inspection patient's wound bed showed signs of good  granulation epithelization at this point. There does not appear to be any evidence of infection which is great news. There overall is a good chance I think of the skin tear is healing without any debridement or additional procedures at this point. They seem to be doing okay. Integumentary (Hair, Skin) Wound #3 status is Open. Original cause of wound was Gradually Appeared. The date acquired was: 04/09/2019. The wound has been in treatment 44 weeks. The wound is located on the Right Metatarsal head  fifth. The wound measures 0.1cm length x 0.1cm width x 0.2cm depth; 0.008cm^2 area and 0.002cm^3 volume. There is Fat Layer (Subcutaneous Tissue) exposed. There is no tunneling or undermining noted. There is a small amount of serosanguineous drainage noted. The wound margin is distinct with the outline attached to the wound base. There is large (67-100%) red, pink granulation within the wound bed. There is no necrotic tissue within the wound bed. Wound #5 status is Open. Original cause of wound was Skin Tear/Laceration. The date acquired was: 08/14/2020. The wound is located on the Left,Distal,Anterior Forearm. The wound measures 1.7cm length x 0.7cm width x 0.1cm depth; 0.935cm^2 area and 0.093cm^3 volume. There is Fat Layer (Subcutaneous Tissue) exposed. There is no tunneling or undermining noted. There is a medium amount of serosanguineous drainage noted. There is large (67-100%) red granulation within the wound bed. There is a small (1-33%) amount of necrotic tissue within the wound bed including Eschar. Wound #6 status is Open. Original cause of wound was Skin Tear/Laceration. The date acquired was: 08/14/2020. The wound is located on the Right,Lateral,Anterior Elbow. The wound measures 0.2cm length x 0.7cm width x 0.1cm depth; 0.11cm^2 area and 0.011cm^3 volume. There is Fat Layer (Subcutaneous Tissue) exposed. There is no tunneling or undermining noted. There is a medium amount of serosanguineous  drainage noted. There is large (67-100%) red granulation within the wound bed. There is a small (1-33%) amount of necrotic tissue within the wound bed including Eschar. Assessment Active Problems ICD-10 Other specified peripheral vascular diseases Non-pressure chronic ulcer of other part of right foot with fat layer exposed Other chronic osteomyelitis, right ankle and foot Unspecified open wound of right elbow, initial encounter Unspecified open wound of left forearm, initial encounter Long term (current) use of anticoagulants Paroxysmal atrial fibrillation Tinea pedis Plan Follow-up Appointments: Return Appointment in 1 week. Home Health: Home Health Company: - [facility] for wound care. May utilize formulary equivalent dressing for wound treatment orders unless otherwise specified. Home Health urse may visit PRN to address patient s wound care needs. Scheduled days for dressing changes to be completed; exception, patient has scheduled wound care visit that day. **Please direct any NON-WOUND related issues/requests for orders to patient's Primary Care Physician. **If current dressing causes regression in wound condition, may D/C ordered dressing product/s and apply Normal Saline Moist Dressing daily until next Wound Healing Center or Other MD appointment. **Notify Wound Healing Center of regression in wound condition at [phone]. LARAYA, ROBERTS ([id number]) Medications-Please add to medication list.: P.O. Antibiotics - Doxycycline x 2 weeks The following medication(s) was prescribed: mupirocin topical 2 % ointment ointment topical Applied to the wound bed on the right lateral foot 1 time per day and cover with a Band-Aid x 1 month starting 08/15/2020 WOUND #3: - Metatarsal head fifth Wound Laterality: Right Cleanser: Normal Saline (Generic) 1 x Per Day/15 Days Discharge Instructions: Wash your hands with soap and water. Remove old dressing, discard into  plastic bag and place into trash. Cleanse the wound with Normal Saline prior to applying a clean dressing using gauze sponges, not tissues or cotton balls. Do not scrub or use excessive force. Pat dry using gauze sponges, not tissue or cotton balls. Topical: Mupirocin Ointment 1 x Per Day/15 Days Discharge Instructions: Apply thin layer to area Secondary Dressing: Mepilex Border Flex, 4x4 (in/in) (Generic) 1 x Per Day/15 Days Discharge Instructions: Apply to wound as directed. Do not cut. WOUND #5: - Forearm Wound Laterality: Left, Anterior, Distal Cleanser:  Normal Saline 1 x Per PJK/93 Days Discharge Instructions: Wash your hands with soap and water. Remove old dressing, discard into plastic bag and place into trash. Cleanse the wound with Normal Saline prior to applying a clean dressing using gauze sponges, not tissues or cotton balls. Do not scrub or use excessive force. Pat dry using gauze sponges, not tissue or cotton balls. Primary Dressing: Xeroform-HBD 2x2 (in/in) 1 x Per Day/15 Days Discharge Instructions: Apply Xeroform-HBD 2x2 (in/in) as directed Secondary Dressing: ABD Pad 5x9 (in/in) 1 x Per Day/15 Days Discharge Instructions: Cover with ABD pad Secured With: Stretch Net Size 10 (yds) 1 x Per Day/15 Days Discharge Instructions: To secure dressing in place. WOUND #6: - Elbow Wound Laterality: Right, Lateral, Anterior Cleanser: Normal Saline 1 x Per Day/15 Days Discharge Instructions: Wash your hands with soap and water. Remove old dressing, discard into plastic bag and place into trash. Cleanse the wound with Normal Saline prior to applying a clean dressing using gauze sponges, not tissues or cotton balls. Do not scrub or use excessive force. Pat dry using gauze sponges, not tissue or cotton balls. Primary Dressing: Xeroform-HBD 2x2 (in/in) 1 x Per Day/15 Days Discharge Instructions: Apply Xeroform-HBD 2x2 (in/in) as directed Secondary Dressing: ABD Pad 5x9 (in/in) 1 x Per  Day/15 Days Discharge Instructions: Cover with ABD pad Secured With: Stretch Net Size 10 (yds) 1 x Per Day/15 Days Discharge Instructions: To secure dressing in place. 1. Would recommend currently that we going continue with the wound care measures as before and the patient is in agreement with the plan. This includes the use of the Xeroform gauze to the elbow and forearm region. 2. I am also can recommend the patient continue to keep pressure off of her foot which I think is still of utmost importance. Right now the [facility] even the small rope cannot go into this area. For that reason we will get a try mupirocin and see if that will help as far as getting the wound closed without any complication. I will send in a prescription for the mupirocin today. 3. With regard to the doxycycline the patient is going to continue to take that which I think is doing a good job for her as well. We will monitor for any signs of worsening as far as infection is concerned and depending on whether or not we see anything going on here will make adjustments as necessary. Obviously I think the hyperbarics is still an option for her but we just have to wait and see how this plays out over the next 1-2 weeks. We will see patient back for reevaluation in 1 week here in the clinic. If anything worsens or changes patient will contact our office for additional recommendations. Electronic Signature(s) Signed: 08/15/2020 1:48:27 PM By: Worthy Keeler PA-C Entered By: Worthy Keeler on 08/15/2020 13:48:27 NEVIN, KOZUCH ([id number]) -------------------------------------------------------------------------------- SuperBill Details Patient Name: [name] Date of Service: 08/15/2020 Medical Record Number: [id number] Patient Account Number: [id number] Date of Birth/Sex: [birth year] (87 y.o. F) Treating RN: Dolan Amen Primary Care Provider: Ria Bush Other Clinician: Referring Provider: Ria Bush Treating Provider/Extender: Skipper Cliche in Treatment: 44 Diagnosis Coding ICD-10 Codes Code Description I73.89 Other specified peripheral vascular diseases L97.512 Non-pressure chronic ulcer of other part of right foot with fat layer exposed M86.671 Other chronic osteomyelitis, right ankle and foot S51.001A Unspecified open wound of right elbow, initial encounter S51.802A Unspecified open wound of left forearm, initial encounter Z79.01 Long term (  current) use of anticoagulants I48.0 Paroxysmal atrial fibrillation B35.3 Tinea pedis Facility Procedures CPT4 Code: [id number] Description: [id number] - WOUND CARE VISIT-LEV 3 EST PT Modifier: Quantity: 1 Physician Procedures CPT4 Code: [id number] Description: [id number] - WC PHYS LEVEL 4 - EST PT Modifier: Quantity: 1 CPT4 Code: Description: ICD-10 Diagnosis Description I73.89 Other specified peripheral vascular diseases L97.512 Non-pressure chronic ulcer of other part of right foot with fat layer e M86.671 Other chronic osteomyelitis, right ankle and foot S51.001A Unspecified  open wound of right elbow, initial encounter Modifier: xposed Quantity: Electronic Signature(s) Signed: 08/15/2020 1:48:51 PM By: Worthy Keeler PA-C Previous Signature: 08/15/2020 1:26:33 PM Version By: Georges Mouse, Minus Breeding RN Entered By: Worthy Keeler on 08/15/2020 13:48:51

## 2020-08-16 ENCOUNTER — Telehealth: Payer: Self-pay

## 2020-08-16 NOTE — Progress Notes (Signed)
[name] ([id number]) Visit Report for 08/15/2020 Arrival Information Details Patient Name: [name], [name]. Date of Service: 08/15/2020 11:15 AM Medical Record Number: [id number] Patient Account Number: [id number] Date of Birth/Sex: [birth year] (87 y.o. F) Treating RN: Dolan Amen Primary Care Daymian Lill: Ria Bush Other Clinician: Referring Janera Peugh: Ria Bush Treating Lynora Dymond/Extender: Skipper Cliche in Treatment: 28 Visit Information History Since Last Visit Added or deleted any medications: No Patient Arrived: [name] Had a fall or experienced change in Yes Arrival Time: 11:26 activities of daily living that may affect Accompanied By: daughter risk of falls: Transfer Assistance: None Hospitalized since last visit: No Patient Identification Verified: Yes Has Dressing in Place as Prescribed: Yes Secondary Verification Process Completed: Yes Pain Present Now: Yes Patient Requires Transmission-Based No Precautions: Patient Has Alerts: Yes Patient Alerts: 08/31/19 ABI R)1.3 L) 1.31 08/31/19 TBI R).61 L).58 Electronic Signature(s) Signed: 08/15/2020 4:52:40 PM By: Jeanine Luz Entered By: Jeanine Luz on 08/15/2020 11:30:40 [name] ([id number]) -------------------------------------------------------------------------------- Clinic Level of Care Assessment Details Patient Name: [name] Date of Service: 08/15/2020 11:15 AM Medical Record Number: [id number] Patient Account Number: [id number] Date of Birth/Sex: 1929/08/26 (87 y.o. F) Treating RN: Dolan Amen Primary Care Qamar Rosman: Ria Bush Other Clinician: Referring Lyndsay Talamante: Ria Bush Treating Isaiah Torok/Extender: Skipper Cliche in Treatment: 54 Clinic Level of Care Assessment Items TOOL 4 Quantity Score X - Use when only an EandM is performed on FOLLOW-UP visit 1 0 ASSESSMENTS - Nursing Assessment / Reassessment X - Reassessment of Co-morbidities  (includes updates in patient status) 1 10 X- 1 5 Reassessment of Adherence to Treatment Plan ASSESSMENTS - Wound and Skin Assessment / Reassessment []  - Simple Wound Assessment / Reassessment - one wound 0 X- 3 5 Complex Wound Assessment / Reassessment - multiple wounds []  - 0 Dermatologic / Skin Assessment (not related to wound area) ASSESSMENTS - Focused Assessment []  - Circumferential Edema Measurements - multi extremities 0 []  - 0 Nutritional Assessment / Counseling / Intervention []  - 0 Lower Extremity Assessment (monofilament, tuning fork, pulses) []  - 0 Peripheral Arterial Disease Assessment (using hand held doppler) ASSESSMENTS - Ostomy and/or Continence Assessment and Care []  - Incontinence Assessment and Management 0 []  - 0 Ostomy Care Assessment and Management (repouching, etc.) PROCESS - Coordination of Care X - Simple Patient / Family Education for ongoing care 1 15 []  - 0 Complex (extensive) Patient / Family Education for ongoing care []  - 0 Staff obtains Programmer, systems, Records, Test Results / Process Orders []  - 0 Staff telephones HHA, Nursing Homes / Clarify orders / etc []  - 0 Routine Transfer to another Facility (non-emergent condition) []  - 0 Routine Hospital Admission (non-emergent condition) []  - 0 New Admissions / Biomedical engineer / Ordering NPWT, Apligraf, etc. []  - 0 Emergency Hospital Admission (emergent condition) X- 1 10 Simple Discharge Coordination []  - 0 Complex (extensive) Discharge Coordination PROCESS - Special Needs []  - Pediatric / Minor Patient Management 0 []  - 0 Isolation Patient Management []  - 0 Hearing / Language / Visual special needs []  - 0 Assessment of Community assistance (transportation, D/C planning, etc.) []  - 0 Additional assistance / Altered mentation []  - 0 Support Surface(s) Assessment (bed, cushion, seat, etc.) INTERVENTIONS - Wound Cleansing / Measurement [name], HLAVATY. ([id number]) []  - 0 Simple Wound  Cleansing - one wound X- 3 5 Complex Wound Cleansing - multiple wounds X- 1 5 Wound Imaging (photographs - any number of wounds) []  - 0 Wound Tracing (instead of photographs) []  - 0 Simple  Wound Measurement - one wound X- 3 5 Complex Wound Measurement - multiple wounds INTERVENTIONS - Wound Dressings []  - Small Wound Dressing one or multiple wounds 0 X- 1 15 Medium Wound Dressing one or multiple wounds []  - 0 Large Wound Dressing one or multiple wounds []  - 0 Application of Medications - topical []  - 0 Application of Medications - injection INTERVENTIONS - Miscellaneous []  - External ear exam 0 []  - 0 Specimen Collection (cultures, biopsies, blood, body fluids, etc.) []  - 0 Specimen(s) / Culture(s) sent or taken to Lab for analysis []  - 0 Patient Transfer (multiple staff / Civil Service fast streamer / Similar devices) []  - 0 Simple Staple / Suture removal (25 or less) []  - 0 Complex Staple / Suture removal (26 or more) []  - 0 Hypo / Hyperglycemic Management (close monitor of Blood Glucose) []  - 0 Ankle / Brachial Index (ABI) - do not check if billed separately X- 1 5 Vital Signs Has the patient been seen at the hospital within the last three years: Yes Total Score: 110 Level Of Care: New/Established - Level 3 Electronic Signature(s) Signed: 08/15/2020 1:26:33 PM By: Georges Mouse, Minus Breeding RN Entered By: Georges Mouse, Minus Breeding on 08/15/2020 11:55:30 [name] ([id number]) -------------------------------------------------------------------------------- Encounter Discharge Information Details Patient Name: [name] Date of Service: 08/15/2020 11:15 AM Medical Record Number: [id number] Patient Account Number: [id number] Date of Birth/Sex: 1930-01-20 (87 y.o. F) Treating RN: Dolan Amen Primary Care Makenze Ellett: Ria Bush Other Clinician: Referring Layton Naves: Ria Bush Treating Malayjah Otoole/Extender: Skipper Cliche in Treatment: 65 Encounter Discharge  Information Items Discharge Condition: Stable Ambulatory Status: Walker Discharge Destination: Home Transportation: Private Auto Accompanied By: daughter Schedule Follow-up Appointment: Yes Clinical Summary of Care: Electronic Signature(s) Signed: 08/15/2020 4:52:40 PM By: Jeanine Luz Entered By: Jeanine Luz on 08/15/2020 12:16:44 [name] ([id number]) -------------------------------------------------------------------------------- Lower Extremity Assessment Details Patient Name: [name] Date of Service: 08/15/2020 11:15 AM Medical Record Number: [id number] Patient Account Number: [id number] Date of Birth/Sex: 1929/12/19 (87 y.o. F) Treating RN: Dolan Amen Primary Care Derrel Moore: Ria Bush Other Clinician: Referring Priyal Musquiz: Ria Bush Treating Savian Mazon/Extender: Jeri Cos Weeks in Treatment: 44 Edema Assessment Assessed: [Left: No] Patrice Paradise: Yes] [Left: Edema] [Right: :] Vascular Assessment Pulses: Dorsalis Pedis Palpable: [Right:Yes] Electronic Signature(s) Signed: 08/15/2020 1:26:33 PM By: Georges Mouse, Minus Breeding RN Signed: 08/15/2020 4:52:40 PM By: Jeanine Luz Entered By: Jeanine Luz on 08/15/2020 11:38:32 [name] ([id number]) -------------------------------------------------------------------------------- Multi Wound Chart Details Patient Name: [name]. Date of Service: 08/15/2020 11:15 AM Medical Record Number: [id number] Patient Account Number: [id number] Date of Birth/Sex: 30-Sep-1929 (87 y.o. F) Treating RN: Dolan Amen Primary Care Riata Ikeda: Ria Bush Other Clinician: Referring Shayanna Thatch: Ria Bush Treating Kasson Lamere/Extender: Skipper Cliche in Treatment: 72 Vital Signs Height(in): 62 Pulse(bpm): 59 Weight(lbs): 140 Blood Pressure(mmHg): Body Mass Index(BMI): 26 Temperature(F): 98.2 Respiratory Rate(breaths/min): 18 Photos: Wound Location: Right Metatarsal head fifth Left,  Distal, Anterior Forearm Right, Lateral, Anterior Elbow Wounding Event: Gradually Appeared Skin Tear/Laceration Skin Tear/Laceration Primary Etiology: Arterial Insufficiency Ulcer Trauma, Other Trauma, Other Comorbid History: Arrhythmia, Osteoarthritis Arrhythmia, Osteoarthritis Arrhythmia, Osteoarthritis Date Acquired: 04/09/2019 08/14/2020 08/14/2020 Weeks of Treatment: 44 0 0 Wound Status: Open Open Open Measurements L x W x D (cm) 0.1x0.1x0.2 1.7x0.7x0.1 0.2x0.7x0.1 Area (cm) : 0.008 0.935 0.11 Volume (cm) : 0.002 0.093 0.011 % Reduction in Area: 99.30% N/A N/A % Reduction in Volume: 98.20% N/A N/A Classification: Full Thickness Without Exposed Full Thickness Without Exposed Full Thickness Without Exposed Support Structures Support Structures Support Structures Exudate  Amount: Small Medium Medium Exudate Type: Serosanguineous Serosanguineous Serosanguineous Exudate Color: red, brown red, brown red, brown Wound Margin: Distinct, outline attached N/A N/A Granulation Amount: Large (67-100%) Large (67-100%) Large (67-100%) Granulation Quality: Red, Pink Red Red Necrotic Amount: None Present (0%) Small (1-33%) Small (1-33%) Necrotic Tissue: N/A Eschar Eschar Exposed Structures: Fat Layer (Subcutaneous Tissue): Fat Layer (Subcutaneous Tissue): Fat Layer (Subcutaneous Tissue): Yes Yes Yes Fascia: No Fascia: No Fascia: No Tendon: No Tendon: No Tendon: No Muscle: No Muscle: No Muscle: No Joint: No Joint: No Joint: No Bone: No Bone: No Bone: No Epithelialization: Small (1-33%) N/A N/A Treatment Notes Electronic Signature(s) Signed: 08/15/2020 1:26:33 PM By: Georges Mouse, Minus Breeding RN Entered By: Georges Mouse, Minus Breeding on 08/15/2020 11:48:16 [name] ([id number]) -------------------------------------------------------------------------------- Multi-Disciplinary Care Plan Details Patient Name: [name]. Date of Service: 08/15/2020 11:15 AM Medical Record Number:  [id number] Patient Account Number: [id number] Date of Birth/Sex: [birth year] (87 y.o. F) Treating RN: Dolan Amen Primary Care Tedric Leeth: Ria Bush Other Clinician: Referring May Ozment: Ria Bush Treating Sheran Newstrom/Extender: Skipper Cliche in Treatment: 47 Active Inactive Electronic Signature(s) Signed: 08/15/2020 1:26:33 PM By: Georges Mouse, Minus Breeding RN Entered By: Georges Mouse, Minus Breeding on 08/15/2020 11:48:03 [name] ([id number]) -------------------------------------------------------------------------------- Pain Assessment Details Patient Name: [name] Date of Service: 08/15/2020 11:15 AM Medical Record Number: [id number] Patient Account Number: [id number] Date of Birth/Sex: 1929-10-11 (87 y.o. F) Treating RN: Dolan Amen Primary Care Anaalicia Reimann: Ria Bush Other Clinician: Referring Caili Escalera: Ria Bush Treating Delonte Musich/Extender: Skipper Cliche in Treatment: 45 Active Problems Location of Pain Severity and Description of Pain Patient Has Paino Yes Site Locations Pain Location: Pain in Ulcers Rate the pain. Current Pain Level: 3 Pain Management and Medication Current Pain Management: Electronic Signature(s) Signed: 08/15/2020 1:26:33 PM By: Georges Mouse, Minus Breeding RN Signed: 08/15/2020 4:52:40 PM By: Jeanine Luz Entered By: Jeanine Luz on 08/15/2020 11:35:02 [name] ([id number]) -------------------------------------------------------------------------------- Patient/Caregiver Education Details Patient Name: [name] Date of Service: 08/15/2020 11:15 AM Medical Record Number: [id number] Patient Account Number: [id number] Date of Birth/Gender: 24-Sep-1929 (87 y.o. F) Treating RN: Dolan Amen Primary Care Physician: Ria Bush Other Clinician: Referring Physician: Ria Bush Treating Physician/Extender: Skipper Cliche in Treatment: 30 Education Assessment Education Provided  To: Patient Education Topics Provided Wound/Skin Impairment: Methods: Explain/Verbal Responses: State content correctly Electronic Signature(s) Signed: 08/15/2020 1:26:33 PM By: Georges Mouse, Minus Breeding RN Entered By: Georges Mouse, Minus Breeding on 08/15/2020 11:55:43 [name] ([id number]) -------------------------------------------------------------------------------- Wound Assessment Details Patient Name: [name] Date of Service: 08/15/2020 11:15 AM Medical Record Number: [id number] Patient Account Number: [id number] Date of Birth/Sex: 1930-03-16 (87 y.o. F) Treating RN: Dolan Amen Primary Care Burgundy Matuszak: Ria Bush Other Clinician: Referring Alida Greiner: Ria Bush Treating Abdalla Naramore/Extender: Skipper Cliche in Treatment: 44 Wound Status Wound Number: 3 Primary Etiology: Arterial Insufficiency Ulcer Wound Location: Right Metatarsal head fifth Wound Status: Open Wounding Event: Gradually Appeared Comorbid History: Arrhythmia, Osteoarthritis Date Acquired: 04/09/2019 Weeks Of Treatment: 44 Clustered Wound: No Photos Wound Measurements Length: (cm) 0.1 Width: (cm) 0.1 Depth: (cm) 0.2 Area: (cm) 0.008 Volume: (cm) 0.002 % Reduction in Area: 99.3% % Reduction in Volume: 98.2% Epithelialization: Small (1-33%) Tunneling: No Undermining: No Wound Description Classification: Full Thickness Without Exposed Support Structures Wound Margin: Distinct, outline attached Exudate Amount: Small Exudate Type: Serosanguineous Exudate Color: red, brown Foul Odor After Cleansing: No Slough/Fibrino No Wound Bed Granulation Amount: Large (67-100%) Exposed Structure Granulation Quality: Red, Pink Fascia Exposed: No Necrotic Amount: None Present (0%) Fat Layer (Subcutaneous Tissue)  Exposed: Yes Tendon Exposed: No Muscle Exposed: No Joint Exposed: No Bone Exposed: No Treatment Notes Wound #3 (Metatarsal head fifth) Wound Laterality: Right Cleanser Normal  Saline Discharge Instruction: Wash your hands with soap and water. Remove old dressing, discard into plastic bag and place into trash. Cleanse the wound with Normal Saline prior to applying a clean dressing using gauze sponges, not tissues or cotton balls. Do not scrub or use excessive force. Pat dry using gauze sponges, not tissue or cotton balls. [name], [name] ([id number]) Peri-Wound Care Topical Mupirocin Ointment Discharge Instruction: Apply thin layer to area Primary Dressing Secondary Dressing Mepilex Border Flex, 4x4 (in/in) Discharge Instruction: Apply to wound as directed. Do not cut. Secured With Compression Wrap Compression Stockings Add-Ons Electronic Signature(s) Signed: 08/15/2020 1:26:33 PM By: Georges Mouse, Minus Breeding RN Signed: 08/15/2020 4:52:40 PM By: Jeanine Luz Entered By: Jeanine Luz on 08/15/2020 11:37:56 [name] ([id number]) -------------------------------------------------------------------------------- Wound Assessment Details Patient Name: [name], VILLESCAS. Date of Service: 08/15/2020 11:15 AM Medical Record Number: [id number] Patient Account Number: [id number] Date of Birth/Sex: 28-Jan-1930 (87 y.o. F) Treating RN: Dolan Amen Primary Care Narmeen Kerper: Ria Bush Other Clinician: Referring Shawnell Dykes: Ria Bush Treating Rami Budhu/Extender: Skipper Cliche in Treatment: 44 Wound Status Wound Number: 5 Primary Etiology: Trauma, Other Wound Location: Left, Distal, Anterior Forearm Wound Status: Open Wounding Event: Skin Tear/Laceration Comorbid History: Arrhythmia, Osteoarthritis Date Acquired: 08/14/2020 Weeks Of Treatment: 0 Clustered Wound: No Photos Wound Measurements Length: (cm) 1.7 Width: (cm) 0.7 Depth: (cm) 0.1 Area: (cm) 0.935 Volume: (cm) 0.093 % Reduction in Area: % Reduction in Volume: Tunneling: No Undermining: No Wound Description Classification: Full Thickness Without Exposed Support  Structures Exudate Amount: Medium Exudate Type: Serosanguineous Exudate Color: red, brown Foul Odor After Cleansing: No Wound Bed Granulation Amount: Large (67-100%) Exposed Structure Granulation Quality: Red Fascia Exposed: No Necrotic Amount: Small (1-33%) Fat Layer (Subcutaneous Tissue) Exposed: Yes Necrotic Quality: Eschar Tendon Exposed: No Muscle Exposed: No Joint Exposed: No Bone Exposed: No Treatment Notes Wound #5 (Forearm) Wound Laterality: Left, Anterior, Distal Cleanser Normal Saline Discharge Instruction: Wash your hands with soap and water. Remove old dressing, discard into plastic bag and place into trash. Cleanse the wound with Normal Saline prior to applying a clean dressing using gauze sponges, not tissues or cotton balls. Do not scrub or use excessive force. Pat dry using gauze sponges, not tissue or cotton balls. [name], [name] ([id number]) Peri-Wound Care Topical Primary Dressing Xeroform-HBD 2x2 (in/in) Discharge Instruction: Apply Xeroform-HBD 2x2 (in/in) as directed Secondary Dressing ABD Pad 5x9 (in/in) Discharge Instruction: Cover with ABD pad Secured With Stretch Net Size 10 (yds) Discharge Instruction: To secure dressing in place. Compression Wrap Compression Stockings Add-Ons Electronic Signature(s) Signed: 08/15/2020 1:26:33 PM By: Georges Mouse, Minus Breeding RN Signed: 08/15/2020 4:52:40 PM By: Jeanine Luz Entered By: Jeanine Luz on 08/15/2020 11:41:30 [name] ([id number]) -------------------------------------------------------------------------------- Wound Assessment Details Patient Name: [name], DINNING. Date of Service: 08/15/2020 11:15 AM Medical Record Number: [id number] Patient Account Number: [id number] Date of Birth/Sex: 11-12-1929 (87 y.o. F) Treating RN: Dolan Amen Primary Care Rusell Meneely: Ria Bush Other Clinician: Referring Yanis Juma: Ria Bush Treating Delawrence Fridman/Extender: Skipper Cliche in  Treatment: 44 Wound Status Wound Number: 6 Primary Etiology: Trauma, Other Wound Location: Right, Lateral, Anterior Elbow Wound Status: Open Wounding Event: Skin Tear/Laceration Comorbid History: Arrhythmia, Osteoarthritis Date Acquired: 08/14/2020 Weeks Of Treatment: 0 Clustered Wound: No Photos Wound Measurements Length: (cm) 0.2 Width: (cm) 0.7 Depth: (cm) 0.1 Area: (cm) 0.11 Volume: (cm) 0.011 % Reduction in  Area: % Reduction in Volume: Tunneling: No Undermining: No Wound Description Classification: Full Thickness Without Exposed Support Structures Exudate Amount: Medium Exudate Type: Serosanguineous Exudate Color: red, brown Foul Odor After Cleansing: No Slough/Fibrino Yes Wound Bed Granulation Amount: Large (67-100%) Exposed Structure Granulation Quality: Red Fascia Exposed: No Necrotic Amount: Small (1-33%) Fat Layer (Subcutaneous Tissue) Exposed: Yes Necrotic Quality: Eschar Tendon Exposed: No Muscle Exposed: No Joint Exposed: No Bone Exposed: No Treatment Notes Wound #6 (Elbow) Wound Laterality: Right, Lateral, Anterior Cleanser Normal Saline Discharge Instruction: Wash your hands with soap and water. Remove old dressing, discard into plastic bag and place into trash. Cleanse the wound with Normal Saline prior to applying a clean dressing using gauze sponges, not tissues or cotton balls. Do not scrub or use excessive force. Pat dry using gauze sponges, not tissue or cotton balls. [name], [name] ([id number]) Peri-Wound Care Topical Primary Dressing Xeroform-HBD 2x2 (in/in) Discharge Instruction: Apply Xeroform-HBD 2x2 (in/in) as directed Secondary Dressing ABD Pad 5x9 (in/in) Discharge Instruction: Cover with ABD pad Secured With Stretch Net Size 10 (yds) Discharge Instruction: To secure dressing in place. Compression Wrap Compression Stockings Add-Ons Electronic Signature(s) Signed: 08/15/2020 1:26:33 PM By: Georges Mouse, Minus Breeding RN Signed:  08/15/2020 4:52:40 PM By: Jeanine Luz Entered By: Jeanine Luz on 08/15/2020 11:43:03 [name] ([id number]) -------------------------------------------------------------------------------- Vitals Details Patient Name: [name]. Date of Service: 08/15/2020 11:15 AM Medical Record Number: [id number] Patient Account Number: [id number] Date of Birth/Sex: 03/13/1930 (87 y.o. F) Treating RN: Dolan Amen Primary Care Lilton Pare: Ria Bush Other Clinician: Referring Jeyla Bulger: Ria Bush Treating Maliaka Brasington/Extender: Skipper Cliche in Treatment: 45 Vital Signs Time Taken: 11:30 Temperature (F): 98.2 Height (in): 62 Pulse (bpm): 59 Weight (lbs): 140 Respiratory Rate (breaths/min): 18 Body Mass Index (BMI): 25.6 Reference Range: 80 - 120 mg / dl Electronic Signature(s) Signed: 08/15/2020 4:52:40 PM By: Jeanine Luz Entered By: Jeanine Luz on 08/15/2020 11:34:47

## 2020-08-16 NOTE — Telephone Encounter (Signed)
Spoke with patient's daughter, [name] and patient is scheduled for excision of BCC at upper chest 09/27/20 at 11am, JS

## 2020-08-16 NOTE — Telephone Encounter (Signed)
-----   Message from Alfonso Patten, MD sent at 08/16/2020  3:35 PM EDT ----- Please call to schedule excision. Already discussed results and plan. Thank you!

## 2020-08-17 DIAGNOSIS — E7849 Other hyperlipidemia: Secondary | ICD-10-CM | POA: Diagnosis not present

## 2020-08-17 DIAGNOSIS — Z7982 Long term (current) use of aspirin: Secondary | ICD-10-CM | POA: Diagnosis not present

## 2020-08-17 DIAGNOSIS — S62102D Fracture of unspecified carpal bone, left wrist, subsequent encounter for fracture with routine healing: Secondary | ICD-10-CM | POA: Diagnosis not present

## 2020-08-17 DIAGNOSIS — M1990 Unspecified osteoarthritis, unspecified site: Secondary | ICD-10-CM | POA: Diagnosis not present

## 2020-08-17 DIAGNOSIS — I5032 Chronic diastolic (congestive) heart failure: Secondary | ICD-10-CM | POA: Diagnosis not present

## 2020-08-17 DIAGNOSIS — Z792 Long term (current) use of antibiotics: Secondary | ICD-10-CM | POA: Diagnosis not present

## 2020-08-17 DIAGNOSIS — G9009 Other idiopathic peripheral autonomic neuropathy: Secondary | ICD-10-CM | POA: Diagnosis not present

## 2020-08-17 DIAGNOSIS — M5136 Other intervertebral disc degeneration, lumbar region: Secondary | ICD-10-CM | POA: Diagnosis not present

## 2020-08-17 DIAGNOSIS — N1831 Chronic kidney disease, stage 3a: Secondary | ICD-10-CM | POA: Diagnosis not present

## 2020-08-17 DIAGNOSIS — Z9181 History of falling: Secondary | ICD-10-CM | POA: Diagnosis not present

## 2020-08-17 DIAGNOSIS — L97511 Non-pressure chronic ulcer of other part of right foot limited to breakdown of skin: Secondary | ICD-10-CM | POA: Diagnosis not present

## 2020-08-17 DIAGNOSIS — K219 Gastro-esophageal reflux disease without esophagitis: Secondary | ICD-10-CM | POA: Diagnosis not present

## 2020-08-17 DIAGNOSIS — E559 Vitamin D deficiency, unspecified: Secondary | ICD-10-CM | POA: Diagnosis not present

## 2020-08-17 DIAGNOSIS — I272 Pulmonary hypertension, unspecified: Secondary | ICD-10-CM | POA: Diagnosis not present

## 2020-08-17 DIAGNOSIS — Z7901 Long term (current) use of anticoagulants: Secondary | ICD-10-CM | POA: Diagnosis not present

## 2020-08-17 DIAGNOSIS — G2581 Restless legs syndrome: Secondary | ICD-10-CM | POA: Diagnosis not present

## 2020-08-17 DIAGNOSIS — I2510 Atherosclerotic heart disease of native coronary artery without angina pectoris: Secondary | ICD-10-CM | POA: Diagnosis not present

## 2020-08-17 DIAGNOSIS — L97513 Non-pressure chronic ulcer of other part of right foot with necrosis of muscle: Secondary | ICD-10-CM | POA: Diagnosis not present

## 2020-08-17 DIAGNOSIS — I872 Venous insufficiency (chronic) (peripheral): Secondary | ICD-10-CM | POA: Diagnosis not present

## 2020-08-17 DIAGNOSIS — M810 Age-related osteoporosis without current pathological fracture: Secondary | ICD-10-CM | POA: Diagnosis not present

## 2020-08-17 DIAGNOSIS — E538 Deficiency of other specified B group vitamins: Secondary | ICD-10-CM | POA: Diagnosis not present

## 2020-08-17 DIAGNOSIS — I130 Hypertensive heart and chronic kidney disease with heart failure and stage 1 through stage 4 chronic kidney disease, or unspecified chronic kidney disease: Secondary | ICD-10-CM | POA: Diagnosis not present

## 2020-08-17 DIAGNOSIS — I4891 Unspecified atrial fibrillation: Secondary | ICD-10-CM | POA: Diagnosis not present

## 2020-08-18 MED ORDER — GABAPENTIN 400 MG PO CAPS: 400.0000 mg | ORAL_CAPSULE | Freq: Every day | ORAL | 1 refills | Status: DC

## 2020-08-18 NOTE — Telephone Encounter (Signed)
Patients daughter [name] called and stated that the 400 mg of Gabapentin has been working well for her mom as prescribed by Dr. Nicki Reaper. [name] wanted to know if she could get a refill on the Gabapentin 100 mg or if Dr. Danise Mina recommends doubling the 300 mg tablets that the patient currently has? Please advise.

## 2020-08-18 NOTE — Telephone Encounter (Signed)
Yes that would be fine. Just let us know.

## 2020-08-18 NOTE — Telephone Encounter (Addendum)
plz notify I've sent in 400mg  pill to take nightly and a second 400mg  tab during day if needed - have them check at pharmacy if available there.

## 2020-08-18 NOTE — Addendum Note (Signed)
Addended by: Ria Bush on: 08/18/2020 02:04 PM   Modules accepted: Orders

## 2020-08-18 NOTE — Telephone Encounter (Signed)
Spoke with pt's daughter, [name] (on dpr), relaying Dr. Synthia Innocent message.  She verbalizes understanding.  However, she asking if 400 mg is not available, would Dr. Darnell Level be able to prescribe a 300 mg and 100 mg pill.

## 2020-08-21 ENCOUNTER — Encounter: Payer: Medicare Other | Admitting: Physician Assistant

## 2020-08-21 ENCOUNTER — Other Ambulatory Visit: Payer: Self-pay

## 2020-08-21 DIAGNOSIS — S51011A Laceration without foreign body of right elbow, initial encounter: Secondary | ICD-10-CM | POA: Diagnosis not present

## 2020-08-21 DIAGNOSIS — I480 Paroxysmal atrial fibrillation: Secondary | ICD-10-CM | POA: Diagnosis not present

## 2020-08-21 DIAGNOSIS — S51812A Laceration without foreign body of left forearm, initial encounter: Secondary | ICD-10-CM | POA: Diagnosis not present

## 2020-08-21 DIAGNOSIS — I739 Peripheral vascular disease, unspecified: Secondary | ICD-10-CM | POA: Diagnosis not present

## 2020-08-21 DIAGNOSIS — M86671 Other chronic osteomyelitis, right ankle and foot: Secondary | ICD-10-CM | POA: Diagnosis not present

## 2020-08-21 DIAGNOSIS — L98492 Non-pressure chronic ulcer of skin of other sites with fat layer exposed: Secondary | ICD-10-CM | POA: Diagnosis not present

## 2020-08-21 DIAGNOSIS — Z7901 Long term (current) use of anticoagulants: Secondary | ICD-10-CM | POA: Diagnosis not present

## 2020-08-21 DIAGNOSIS — B353 Tinea pedis: Secondary | ICD-10-CM | POA: Diagnosis not present

## 2020-08-21 DIAGNOSIS — L97512 Non-pressure chronic ulcer of other part of right foot with fat layer exposed: Secondary | ICD-10-CM | POA: Diagnosis not present

## 2020-08-21 NOTE — Addendum Note (Signed)
Addended by: Alfonso Patten on: 08/21/2020 01:49 PM   Modules accepted: Level of Service

## 2020-08-21 NOTE — Progress Notes (Addendum)
LILLIEANNA, HAVLIK ([id number]) Visit Report for 08/21/2020 Arrival Information Details Patient Name: [name], [name]. Date of Service: 08/21/2020 11:15 AM Medical Record Number: [id number] Patient Account Number: [id number] Date of Birth/Sex: 22-Apr-1929 (87 y.o. F) Treating RN: Carlene Coria Primary Care Taralyn Ferraiolo: Ria Bush Other Clinician: Referring Jayant Kriz: Ria Bush Treating Koleman Marling/Extender: Skipper Cliche in Treatment: 28 Visit Information History Since Last Visit All ordered tests and consults were completed: No Patient Arrived: Gilford Rile Added or deleted any medications: No Arrival Time: 11:46 Any new allergies or adverse reactions: No Accompanied By: daughter Had a fall or experienced change in No Transfer Assistance: None activities of daily living that may affect Patient Identification Verified: Yes risk of falls: Secondary Verification Process Completed: Yes Signs or symptoms of abuse/neglect since last visito No Patient Requires Transmission-Based No Hospitalized since last visit: No Precautions: Implantable device outside of the clinic excluding No Patient Has Alerts: Yes cellular tissue based products placed in the center Patient Alerts: 08/31/19 ABI R)1.3 L) since last visit: 1.31 Pain Present Now: No 08/31/19 TBI R).61 L).58 Electronic Signature(s) Signed: 08/21/2020 8:20:21 PM By: Carlene Coria RN Entered By: Carlene Coria on 08/21/2020 11:53:08 [name] ([id number]) -------------------------------------------------------------------------------- Clinic Level of Care Assessment Details Patient Name: [name], [name]. Date of Service: 08/21/2020 11:15 AM Medical Record Number: [id number] Patient Account Number: [id number] Date of Birth/Sex: [birth year] (87 y.o. F) Treating RN: Donnamarie Poag Primary Care Leonides Minder: Ria Bush Other Clinician: Referring Pat Sires: Ria Bush Treating Jeyli Zwicker/Extender: Skipper Cliche in  Treatment: 55 Clinic Level of Care Assessment Items TOOL 4 Quantity Score []  - Use when only an EandM is performed on FOLLOW-UP visit 0 ASSESSMENTS - Nursing Assessment / Reassessment []  - Reassessment of Co-morbidities (includes updates in patient status) 0 []  - 0 Reassessment of Adherence to Treatment Plan ASSESSMENTS - Wound and Skin Assessment / Reassessment []  - Simple Wound Assessment / Reassessment - one wound 0 X- 1 5 Complex Wound Assessment / Reassessment - multiple wounds []  - 0 Dermatologic / Skin Assessment (not related to wound area) ASSESSMENTS - Focused Assessment []  - Circumferential Edema Measurements - multi extremities 0 []  - 0 Nutritional Assessment / Counseling / Intervention []  - 0 Lower Extremity Assessment (monofilament, tuning fork, pulses) []  - 0 Peripheral Arterial Disease Assessment (using hand held doppler) ASSESSMENTS - Ostomy and/or Continence Assessment and Care []  - Incontinence Assessment and Management 0 []  - 0 Ostomy Care Assessment and Management (repouching, etc.) PROCESS - Coordination of Care X - Simple Patient / Family Education for ongoing care 1 15 []  - 0 Complex (extensive) Patient / Family Education for ongoing care X- 1 10 Staff obtains Programmer, systems, Records, Test Results / Process Orders X- 1 10 Staff telephones HHA, Nursing Homes / Clarify orders / etc []  - 0 Routine Transfer to another Facility (non-emergent condition) []  - 0 Routine Hospital Admission (non-emergent condition) []  - 0 New Admissions / Biomedical engineer / Ordering NPWT, Apligraf, etc. []  - 0 Emergency Hospital Admission (emergent condition) X- 1 10 Simple Discharge Coordination []  - 0 Complex (extensive) Discharge Coordination PROCESS - Special Needs []  - Pediatric / Minor Patient Management 0 []  - 0 Isolation Patient Management []  - 0 Hearing / Language / Visual special needs []  - 0 Assessment of Community assistance (transportation, D/C  planning, etc.) []  - 0 Additional assistance / Altered mentation []  - 0 Support Surface(s) Assessment (bed, cushion, seat, etc.) INTERVENTIONS - Wound Cleansing / Measurement [name], Crystal S. ([id number]) []  - 0 Simple Wound Cleansing -  one wound X- 3 5 Complex Wound Cleansing - multiple wounds []  - 0 Wound Imaging (photographs - any number of wounds) []  - 0 Wound Tracing (instead of photographs) []  - 0 Simple Wound Measurement - one wound []  - 0 Complex Wound Measurement - multiple wounds INTERVENTIONS - Wound Dressings []  - Small Wound Dressing one or multiple wounds 0 X- 3 15 Medium Wound Dressing one or multiple wounds []  - 0 Large Wound Dressing one or multiple wounds []  - 0 Application of Medications - topical []  - 0 Application of Medications - injection INTERVENTIONS - Miscellaneous []  - External ear exam 0 []  - 0 Specimen Collection (cultures, biopsies, blood, body fluids, etc.) []  - 0 Specimen(s) / Culture(s) sent or taken to Lab for analysis []  - 0 Patient Transfer (multiple staff / Civil Service fast streamer / Similar devices) []  - 0 Simple Staple / Suture removal (25 or less) []  - 0 Complex Staple / Suture removal (26 or more) []  - 0 Hypo / Hyperglycemic Management (close monitor of Blood Glucose) []  - 0 Ankle / Brachial Index (ABI) - do not check if billed separately X- 1 5 Vital Signs Has the patient been seen at the hospital within the last three years: Yes Total Score: 115 Level Of Care: New/Established - Level 3 Electronic Signature(s) Signed: 08/22/2020 2:35:55 PM By: Donnamarie Poag Entered By: Donnamarie Poag on 08/21/2020 12:18:15 [name] ([id number]) -------------------------------------------------------------------------------- Encounter Discharge Information Details Patient Name: [name]. Date of Service: 08/21/2020 11:15 AM Medical Record Number: [id number] Patient Account Number: [id number] Date of Birth/Sex: February 07, 1930 (87 y.o.  F) Treating RN: Donnamarie Poag Primary Care Bonham Zingale: Ria Bush Other Clinician: Referring Viren Lebeau: Ria Bush Treating Sabriah Hobbins/Extender: Skipper Cliche in Treatment: 45 Encounter Discharge Information Items Discharge Condition: Stable Ambulatory Status: Walker Discharge Destination: Home Transportation: Private Auto Accompanied By: daughter Schedule Follow-up Appointment: Yes Clinical Summary of Care: Electronic Signature(s) Signed: 08/22/2020 2:35:55 PM By: Donnamarie Poag Entered By: Donnamarie Poag on 08/21/2020 12:28:36 [name] ([id number]) -------------------------------------------------------------------------------- Multi Wound Chart Details Patient Name: [name] Date of Service: 08/21/2020 11:15 AM Medical Record Number: [id number] Patient Account Number: [id number] Date of Birth/Sex: 18-May-1929 (87 y.o. F) Treating RN: Donnamarie Poag Primary Care Abygale Karpf: Ria Bush Other Clinician: Referring Idelle Reimann: Ria Bush Treating Ann-Marie Kluge/Extender: Skipper Cliche in Treatment: 35 Vital Signs Height(in): 62 Pulse(bpm): 67 Weight(lbs): 140 Blood Pressure(mmHg): 184/71 Body Mass Index(BMI): 26 Temperature(F): 98.4 Respiratory Rate(breaths/min): 20 Photos: Wound Location: Right Metatarsal head fifth Left, Distal, Anterior Forearm Right, Lateral, Anterior Elbow Wounding Event: Gradually Appeared Skin Tear/Laceration Skin Tear/Laceration Primary Etiology: Arterial Insufficiency Ulcer Trauma, Other Trauma, Other Comorbid History: Arrhythmia, Osteoarthritis Arrhythmia, Osteoarthritis Arrhythmia, Osteoarthritis Date Acquired: 04/09/2019 08/14/2020 08/14/2020 Weeks of Treatment: 44 0 0 Wound Status: Open Open Open Measurements L x W x D (cm) 0.1x0.1x0.1 1.7x0.9x0.1 0.2x1.5x0.1 Area (cm) : 0.008 1.202 0.236 Volume (cm) : 0.001 0.12 0.024 % Reduction in Area: 99.30% -28.60% -114.50% % Reduction in Volume: 99.10% -29.00%  -118.20% Classification: Full Thickness Without Exposed Full Thickness Without Exposed Full Thickness Without Exposed Support Structures Support Structures Support Structures Exudate Amount: Small Medium Medium Exudate Type: Serosanguineous Serosanguineous Serosanguineous Exudate Color: red, brown red, brown red, brown Wound Margin: Distinct, outline attached N/A N/A Granulation Amount: Large (67-100%) Medium (34-66%) None Present (0%) Granulation Quality: Red, Pink Pink N/A Necrotic Amount: None Present (0%) Medium (34-66%) Large (67-100%) Necrotic Tissue: N/A Adherent Slough Eschar Exposed Structures: Fat Layer (Subcutaneous Tissue): Fat Layer (Subcutaneous Tissue): Fat Layer (Subcutaneous Tissue): Yes  Yes Yes Fascia: No Fascia: No Fascia: No Tendon: No Tendon: No Tendon: No Muscle: No Muscle: No Muscle: No Joint: No Joint: No Joint: No Bone: No Bone: No Bone: No Epithelialization: Small (1-33%) None None Treatment Notes Electronic Signature(s) Signed: 08/22/2020 2:35:55 PM By: Donnamarie Poag Entered By: Donnamarie Poag on 08/21/2020 12:15:00 [name] ([id number]) -------------------------------------------------------------------------------- [place] Details Patient Name: [name]. Date of Service: 08/21/2020 11:15 AM Medical Record Number: [id number] Patient Account Number: [id number] Date of Birth/Sex: 1929/06/03 (87 y.o. F) Treating RN: Donnamarie Poag Primary Care Karn Derk: Ria Bush Other Clinician: Referring Vora Clover: Ria Bush Treating Ardyce Heyer/Extender: Jeri Cos Weeks in Treatment: 79 Active Inactive Electronic Signature(s) Signed: 08/22/2020 2:35:55 PM By: Donnamarie Poag Entered By: Donnamarie Poag on 08/21/2020 12:14:48 [name] ([id number]) -------------------------------------------------------------------------------- Pain Assessment Details Patient Name: [name] Date of Service: 08/21/2020 11:15  AM Medical Record Number: [id number] Patient Account Number: [id number] Date of Birth/Sex: [birth year] (87 y.o. F) Treating RN: Carlene Coria Primary Care Basir Niven: Ria Bush Other Clinician: Referring Cadden Elizondo: Ria Bush Treating Devan Babino/Extender: Skipper Cliche in Treatment: 40 Active Problems Location of Pain Severity and Description of Pain Patient Has Paino No Site Locations Pain Management and Medication Current Pain Management: Electronic Signature(s) Signed: 08/21/2020 8:20:21 PM By: Carlene Coria RN Entered By: Carlene Coria on 08/21/2020 11:53:39 [name] ([id number]) -------------------------------------------------------------------------------- Patient/Caregiver Education Details Patient Name: [name] Date of Service: 08/21/2020 11:15 AM Medical Record Number: [id number] Patient Account Number: [id number] Date of Birth/Gender: Dec 14, 1929 (87 y.o. F) Treating RN: Donnamarie Poag Primary Care Physician: Ria Bush Other Clinician: Referring Physician: Ria Bush Treating Physician/Extender: Skipper Cliche in Treatment: 63 Education Assessment Education Provided To: Patient Education Topics Provided Basic Hygiene: Wound/Skin Impairment: Electronic Signature(s) Signed: 08/22/2020 2:35:55 PM By: Donnamarie Poag Entered By: Donnamarie Poag on 08/21/2020 12:19:08 [name] ([id number]) -------------------------------------------------------------------------------- Wound Assessment Details Patient Name: [name]. Date of Service: 08/21/2020 11:15 AM Medical Record Number: [id number] Patient Account Number: [id number] Date of Birth/Sex: 1929-12-31 (87 y.o. F) Treating RN: Carlene Coria Primary Care Claudie Rathbone: Ria Bush Other Clinician: Referring Rocko Fesperman: Ria Bush Treating Charl Wellen/Extender: Skipper Cliche in Treatment: 44 Wound Status Wound Number: 3 Primary Etiology: Arterial Insufficiency  Ulcer Wound Location: Right Metatarsal head fifth Wound Status: Open Wounding Event: Gradually Appeared Comorbid History: Arrhythmia, Osteoarthritis Date Acquired: 04/09/2019 Weeks Of Treatment: 44 Clustered Wound: No Photos Wound Measurements Length: (cm) 0.1 Width: (cm) 0.1 Depth: (cm) 0.1 Area: (cm) 0.008 Volume: (cm) 0.001 % Reduction in Area: 99.3% % Reduction in Volume: 99.1% Epithelialization: Small (1-33%) Tunneling: No Undermining: No Wound Description Classification: Full Thickness Without Exposed Support Structures Wound Margin: Distinct, outline attached Exudate Amount: Small Exudate Type: Serosanguineous Exudate Color: red, brown Foul Odor After Cleansing: No Slough/Fibrino No Wound Bed Granulation Amount: Large (67-100%) Exposed Structure Granulation Quality: Red, Pink Fascia Exposed: No Necrotic Amount: None Present (0%) Fat Layer (Subcutaneous Tissue) Exposed: Yes Tendon Exposed: No Muscle Exposed: No Joint Exposed: No Bone Exposed: No Treatment Notes Wound #3 (Metatarsal head fifth) Wound Laterality: Right Cleanser Normal Saline Discharge Instruction: Wash your hands with soap and water. Remove old dressing, discard into plastic bag and place into trash. Cleanse the wound with Normal Saline prior to applying a clean dressing using gauze sponges, not tissues or cotton balls. Do not scrub or use excessive force. Pat dry using gauze sponges, not tissue or cotton balls. [name], [name] ([id number]) Peri-Wound Care Topical Mupirocin Ointment Discharge Instruction: Apply thin layer to area  Primary Dressing Secondary Dressing Coverlet Latex-Free Fabric Adhesive Dressings Discharge Instruction: Knuckle Secured With Compression Wrap Compression Stockings Add-Ons Electronic Signature(s) Signed: 08/21/2020 8:20:21 PM By: Carlene Coria RN Entered By: Carlene Coria on 08/21/2020 12:06:06 [name]  ([id number]) -------------------------------------------------------------------------------- Wound Assessment Details Patient Name: [name]. Date of Service: 08/21/2020 11:15 AM Medical Record Number: [id number] Patient Account Number: [id number] Date of Birth/Sex: 11/26/1929 (87 y.o. F) Treating RN: Carlene Coria Primary Care Rakesh Dutko: Ria Bush Other Clinician: Referring Analeya Luallen: Ria Bush Treating Tyra Michelle/Extender: Skipper Cliche in Treatment: 44 Wound Status Wound Number: 5 Primary Etiology: Trauma, Other Wound Location: Left, Distal, Anterior Forearm Wound Status: Open Wounding Event: Skin Tear/Laceration Comorbid History: Arrhythmia, Osteoarthritis Date Acquired: 08/14/2020 Weeks Of Treatment: 0 Clustered Wound: No Photos Wound Measurements Length: (cm) 1.7 Width: (cm) 0.9 Depth: (cm) 0.1 Area: (cm) 1.202 Volume: (cm) 0.12 % Reduction in Area: -28.6% % Reduction in Volume: -29% Epithelialization: None Tunneling: No Undermining: No Wound Description Classification: Full Thickness Without Exposed Support Structures Exudate Amount: Medium Exudate Type: Serosanguineous Exudate Color: red, brown Foul Odor After Cleansing: No Slough/Fibrino Yes Wound Bed Granulation Amount: Medium (34-66%) Exposed Structure Granulation Quality: Pink Fascia Exposed: No Necrotic Amount: Medium (34-66%) Fat Layer (Subcutaneous Tissue) Exposed: Yes Necrotic Quality: Adherent Slough Tendon Exposed: No Muscle Exposed: No Joint Exposed: No Bone Exposed: No Treatment Notes Wound #5 (Forearm) Wound Laterality: Left, Anterior, Distal Cleanser Normal Saline Discharge Instruction: Wash your hands with soap and water. Remove old dressing, discard into plastic bag and place into trash. Cleanse the wound with Normal Saline prior to applying a clean dressing using gauze sponges, not tissues or cotton balls. Do not scrub or use excessive force. Pat dry using gauze  sponges, not tissue or cotton balls. [name], [name] ([id number]) Peri-Wound Care Topical Primary Dressing Xeroform-HBD 2x2 (in/in) Discharge Instruction: Apply Xeroform-HBD 2x2 (in/in) as directed Secondary Dressing ABD Pad 5x9 (in/in) Discharge Instruction: Cover with ABD pad Secured With Stretch Net Size 10 (yds) Discharge Instruction: To secure dressing in place. Compression Wrap Compression Stockings Add-Ons Electronic Signature(s) Signed: 08/21/2020 8:20:21 PM By: Carlene Coria RN Entered By: Carlene Coria on 08/21/2020 12:07:36 [name] ([id number]) -------------------------------------------------------------------------------- Wound Assessment Details Patient Name: [name]. Date of Service: 08/21/2020 11:15 AM Medical Record Number: [id number] Patient Account Number: [id number] Date of Birth/Sex: 1929-12-27 (87 y.o. F) Treating RN: Carlene Coria Primary Care Sanyia Dini: Ria Bush Other Clinician: Referring Trevious Rampey: Ria Bush Treating Diaz Crago/Extender: Skipper Cliche in Treatment: 44 Wound Status Wound Number: 6 Primary Etiology: Trauma, Other Wound Location: Right, Lateral, Anterior Elbow Wound Status: Open Wounding Event: Skin Tear/Laceration Comorbid History: Arrhythmia, Osteoarthritis Date Acquired: 08/14/2020 Weeks Of Treatment: 0 Clustered Wound: No Photos Wound Measurements Length: (cm) 0.2 Width: (cm) 1.5 Depth: (cm) 0.1 Area: (cm) 0.236 Volume: (cm) 0.024 % Reduction in Area: -114.5% % Reduction in Volume: -118.2% Epithelialization: None Tunneling: No Undermining: No Wound Description Classification: Full Thickness Without Exposed Support Structures Exudate Amount: Medium Exudate Type: Serosanguineous Exudate Color: red, brown Foul Odor After Cleansing: No Slough/Fibrino Yes Wound Bed Granulation Amount: None Present (0%) Exposed Structure Necrotic Amount: Large (67-100%) Fascia Exposed: No Necrotic  Quality: Eschar Fat Layer (Subcutaneous Tissue) Exposed: Yes Tendon Exposed: No Muscle Exposed: No Joint Exposed: No Bone Exposed: No Treatment Notes Wound #6 (Elbow) Wound Laterality: Right, Lateral, Anterior Cleanser Normal Saline Discharge Instruction: Wash your hands with soap and water. Remove old dressing, discard into plastic bag and place into trash. Cleanse the wound with Normal Saline prior  to applying a clean dressing using gauze sponges, not tissues or cotton balls. Do not scrub or use excessive force. Pat dry using gauze sponges, not tissue or cotton balls. [name], [name] ([id number]) Peri-Wound Care Topical Primary Dressing Xeroform-HBD 2x2 (in/in) Discharge Instruction: Apply Xeroform-HBD 2x2 (in/in) as directed Secondary Dressing ABD Pad 5x9 (in/in) Discharge Instruction: Cover with ABD pad Secured With Stretch Net Size 10 (yds) Discharge Instruction: To secure dressing in place. Compression Wrap Compression Stockings Add-Ons Electronic Signature(s) Signed: 08/21/2020 8:20:21 PM By: Carlene Coria RN Entered By: Carlene Coria on 08/21/2020 12:08:59 [name] ([id number]) -------------------------------------------------------------------------------- Vitals Details Patient Name: [name] Date of Service: 08/21/2020 11:15 AM Medical Record Number: [id number] Patient Account Number: [id number] Date of Birth/Sex: 02/16/1930 (87 y.o. F) Treating RN: Carlene Coria Primary Care Coree Riester: Ria Bush Other Clinician: Referring Rush Salce: Ria Bush Treating Deegan Valentino/Extender: Skipper Cliche in Treatment: 44 Vital Signs Time Taken: 11:53 Temperature (F): 98.4 Height (in): 62 Pulse (bpm): 62 Weight (lbs): 140 Respiratory Rate (breaths/min): 20 Body Mass Index (BMI): 25.6 Blood Pressure (mmHg): 184/71 Reference Range: 80 - 120 mg / dl Electronic Signature(s) Signed: 08/21/2020 8:20:21 PM By: Carlene Coria RN Entered By: Carlene Coria on 08/21/2020 11:53:31

## 2020-08-21 NOTE — Progress Notes (Addendum)
[name], [name] ([id number]) Visit Report for 08/21/2020 Chief Complaint Document Details Patient Name: [name], [name]. Date of Service: 08/21/2020 11:15 AM Medical Record Number: [id number] Patient Account Number: [id number] Date of Birth/Sex: 12-13-1929 (87 y.o. F) Treating RN: Donnamarie Poag Primary Care Provider: Ria Bush Other Clinician: Referring Provider: Ria Bush Treating Provider/Extender: Skipper Cliche in Treatment: 75 Information Obtained from: Patient Chief Complaint Right Lateral Foot Ulcer and Right Elbow and Left Forearm Skin Tears Electronic Signature(s) Signed: 08/21/2020 11:28:52 AM By: Worthy Keeler PA-C Entered By: Worthy Keeler on 08/21/2020 11:28:52 [name], [name] ([id number]) -------------------------------------------------------------------------------- HPI Details Patient Name: [name] Date of Service: 08/21/2020 11:15 AM Medical Record Number: [id number] Patient Account Number: [id number] Date of Birth/Sex: [birth year] (87 y.o. F) Treating RN: Donnamarie Poag Primary Care Provider: Ria Bush Other Clinician: Referring Provider: Ria Bush Treating Provider/Extender: Skipper Cliche in Treatment: 53 History of Present Illness HPI Description: 10/12/2019 upon evaluation today patient appears to be doing somewhat poorly in regard to her wound at this point. She has a ulcer over the right fifth metatarsal region. This has been managed by podiatry up to this point. Unfortunately the patient just does not seem to be making the progress that they were hoping for an subsequently the patient was referred to [place] by [facility] Dr. Posey Pronto. With that being said I did note that the patient does have peripheral vascular disease which has been managed by Dr. Lazaro Arms her most recent angiogram with intervention was on 08/02/2019. The ABIs following were on the right 1.30 with a TBI of 0.61 normal left 1.31 with a TBI of  0.58. Obviously things seem to be doing somewhat better at this point hopefully enough to allow her to heal. She is on anticoagulant therapy due to atrial fibrillation long-term. With that being said the patient is unfortunately having quite a bit of discomfort at this time secondary to the wound. She also has erythema which is very likely secondary to infection. I am going obtain a culture of the area today once debridement is complete. Apparently the patient also had a x-ray that was performed by Dr. Posey Pronto on 09/21/2019 and this showed that the patient had no obvious signs on x-ray of cortical irregularity and no osteomyelitis obviously noted. With that being said as I discussed with the patient's daughter as well this does not indicate that the patient absolutely has no osteomyelitis as x-rays are only at best 50% helpful in identifying this complication. Nonetheless I do believe that we need to have the patient continue to have this further evaluated specifically I am to recommend that we check into an MRI which I think would be ideal to evaluate whether or not there is osteomyelitis present. 7/16; this is a patient with an ulcer over the right fifth metatarsal head. She follows with Dr. Lucky Cowboy [facility]. She is due to have an MRI of the foot on 7/26. Using Santyl to the wound 11/04/2019 on evaluation today patient appears to be doing okay in regard to her wound at this time. Unfortunately since I last saw her which was actually her second visit here in the clinic she has moved into a new apartment and then subsequently had a fall. She ended up in the hospital she did fracture her left arm and subsequently has also been dealing with a lot of dizziness she has been at [employer]. She tells me that getting in the change the dressing has  been somewhat difficult as far as daily dressing changes are concerned with the Santyl. Nonetheless when they do change it that is what is been used  and her daughter-in-law has also been coming in to help out as well with the dressing changes to try to make sure it gets done daily. Fortunately there is no signs of active infection at this time. No fever chills noted. I do believe the Santyl has been beneficial and is likely still the best way to go at this point for the patient. I did review the patient's MRI which showed some potential for reactive osteitis but did not show any signs of osteomyelitis overtly. That cannot be excluded but again right now it does not appear that is very likely present as well. This is good news. I also did review her culture she has been on the Bactrim she tells me she has taken this and again that was appropriate based on the results of her culture. 11/19/2019 upon evaluation today patient appears to be doing decently well in regard to her wound. Fortunately there is no signs of active infection at this time wound is not significantly larger in fact is roughly about the same at this point. With that being said there is not as much erythema as previously noted I think the infection seems to be under much better control which is great news. Unfortunately she has fallen since I last saw her and broken her left arm she has an appointment with them following the appointment with me. 8/25; patient was worked in acutely today at the request of home health and was concerned about her right heel, generalized erythema in her feet bilaterally. According to her daughter things have been getting worse over the last for 5 days but not precisely from a wound care point of view. Her wound is on the lateral part of the right fifth metatarsal head looks about the same as I remember this. She has developed dryness and flaking of the skin on her plantar feet. As well a very tender area localized over the insertion point of the Achilles tendon. She has restless leg syndrome. Does not specifically offload the area at night. She wears  socks at home and does not really give me a pressure on the right heel issue. I reviewed her vascular status. She underwent her last revascularization on 08/02/2019 by Dr. Lucky Cowboy. This included an angioplasty of the right peroneal artery as well as the right posterior tibial artery. Her last noninvasive arterial study was on 11/23/2019. On the right her ABI was 1.08 TBI of 0.48. On the left ABI at 1.21 with a TBI of 0.54. Family relates that Dr. Lucky Cowboy felt that he had done the best he could and this. We have been using Santyl to the one wound she has on the right lateral fifth metatarsal head. She does not have a new wound today which is the concern that letter coming into the clinic 12/09/2019 on evaluation today patient appears to be doing decently well in regard to her foot ulcer at this point. She does note that with the compression wrap that the home health nurse put on after contacting our clinic that she actually did much better and felt much better. Her heel is also improved. Fortunately there is no signs of active infection at this time. No fevers, chills, nausea, vomiting, or diarrhea. 12/24/2019 upon evaluation today patient appears to be doing somewhat poorly still in regard to her foot. Unfortunately this is just  not showing the signs of improvement that I would like to see it is a little bit cleaner but also appears to be erythematous and I believe there is infection noted at this point. We will get a need to address this in my opinion. I did actually recommend that we switch back to the Iodoflex as well apparently the home health nurse had switch this away that not really what we want to do at this point in my opinion. They were using silver alginate I think most recently. 01/06/2020 upon evaluation today patient actually appears to be doing much better in regard to her wound. We have switched back to the silver alginate after her home health nurse contacted me and honestly I feel like she is  doing quite well in that regard. She does have a lot of drainage and therefore I think that is probably a better option for. With that being said she is tolerating the compression wrap without complication that also seems to be helping. 01/28/2020 on evaluation today patient appears to be doing well with regard to her foot ulcer. I do feel like she is showing signs of improvement which is great news. There is no signs of active infection at this time which is also good news. No fevers, chills, nausea, vomiting, or diarrhea. Unfortunately she did have a fall and sustained an injury to the left upper arm. She has a skin tear here this appears to be doing okay although [name], [name]. ([id number]) the dressing was stuck to this this was an alginate dressing. I really think she probably needs something like Xeroform to try to prevent anything from sticking to this region. Other than that she seems to be doing quite well. 02/11/2020 on evaluation today patient's arm skin tear appears to be completely healed which is great. Unfortunately she is still having issues at this time with her foot although this seems to be showing signs of improvement it still very slow. There is no signs of active infection at this time. 03/06/2020 upon evaluation today patient appears to be doing decently well in regard to her foot ulcer. There is some minimal slough noted on the surface of the wound building up at this point. Otherwise I really feel like she is doing quite nicely with quite a bit of new granulation and epithelial growth. 03/23/2020 upon evaluation today patient appears to be doing well with regards to her wound. She has been tolerating the dressing changes without complication. Fortunately there is no evidence of active infection and overall feel like her foot ulcer is doing excellent at this point. 04/13/2020 on evaluation today patient appears to be doing well with regard to her foot ulcer. She has been  tolerating the dressing changes without complication. Fortunately there is no signs of active infection at this time. Overall I feel like the wound is getting close to complete closure and this appears to be doing quite well in my opinion. 04/27/2020 on evaluation today patient appears to be doing excellent in regard to her wound. She has been tolerating the dressing changes without complication. Fortunately there is no signs of active infection and overall very pleased with where things stand today. In fact I feel like the wound is very close to complete resolution. 05/18/2020 upon evaluation today patient appears to be doing well with regard to her foot ulcer. There is no evidence of active infection at this time which is great news and overall very pleased with where things stand. No fevers,  chills, nausea, vomiting, or diarrhea. I do feel like she is making great progress and I feel like that the wound is very tiny but again this last portion getting close has been somewhat difficult. 07/05/2020 upon evaluation today patient appears to be doing a little poorly in regard to her foot ulcer. Fortunately there is no signs of active infection at this time systemically though locally I feel it may be some cellulitis it looks like this wound may have prematurely sealed up subsequently causing a blister that had to be removed. The good news is that I do not think it is a very bad infection but I believe she may be experiencing cellulitis. 06/16/2020 upon evaluation today patient appears to be doing well with regard to her wound. It seems like it showed signs of improvement compared to last week where she had more infection noted. With that being said I do believe that based on what I am seeing the patient seems to be making excellent progress although I think still this is a small area that is going require some debridement to clear away some of the callus and drainage which is trapping fluid behind it based  on what I see. 06/23/2020 upon evaluation today patient appears to be doing well with regard to her leg ulcer. She has been tolerating the dressing changes without application. And currently I do believe the alginate is doing a good job keeping it dry it still tends to cover over and we do not want her trapping any fluid therefore I do remove that outer callus/drainage covering each time she comes in. Overall I think this is doing a great job. 06/30/2020 patient appears to be doing okay in regard to pain in regard to her wound. Unfortunately she continues to develop a callus buildup over the area. This is unfortunate as to be honest it is continuing to cause problems with her basically being able to heal as she needs to really granulate in from the bottom out on that is not happening. I think we need to find something that works better to keep this area open. 07/10/2020 upon evaluation today patient unfortunately appears to be having increased pain yet again and is not doing nearly as well as she was previous. I do believe this is indication of infection. The patient is going to require a culture today it appears. 07/18/2020 upon evaluation today patient appears to be doing well with regard to her wounds. She has been tolerating the dressing changes without complication. In fact this appears to be doing significantly better compared to last week. I did review her however her pathology which showed acute osteomyelitis. Also did review the culture which showed Staph aureus. She is on Bactrim that does seem to be helping. I am unlikely going to continue that for a total of 2 months. I think they would prefer that over going to infectious disease. I am definitely okay with that as far as that is concerned. I am going to likely go ahead and refill the Bactrim next week for another 2 weeks supply in order to get the patient through for this month and then will have to do 1 more month for a full 8 weeks of  treatment. Several of the times that I sent patients to infectious disease they just kept the patient on oral antibiotic therapy as opposed to switching IV anyway and she seems to be doing very well after just 1 week. 07/25/2020 upon evaluation today patient appears to be  doing well with regard to her wound. She still has an opening and there is still some depth there as well. With that being said I do think that the patient does have osteomyelitis and very well could be a candidate for hyperbaric oxygen therapy. However I [name] see how she does over the next 2 weeks with the oral antibiotics. Obviously if she is not close by that time I think proceeding with the HBO therapy would be appropriate. I did discuss this with the patient and her daughter today. I think that this may be something to strongly consider going forward if things do not improve over again the next 2 weeks. 08/08/2020 upon evaluation today patient appears to be doing decently well in regard to her wound. The good news is she does not appear to have any signs of infection worsening which is good she still has a small hole open here in the [facility] I think is still appropriate to try to let this granulate in from the bottom out. With that being said this is getting tiny but nonetheless is not completely resolving. Reason I did discuss the possibility of hyperbaric oxygen therapy with the patient and her daughter today again. 08/15/2020 upon evaluation today patient appears to be doing well with regard to her foot ulcer. In fact I am very pleased with where things stand in that regard. I am not even certain she is going to have to have the hyperbarics although this still something to consider depending on how things go. Overall the biggest issue today is she actually had a fall over top of her walker causing several contusions over the upper and lower extremities unfortunately she does have a new skin tear 1 in the right elbow  location and one on the left forearm. 08/21/2020 upon evaluation today patient actually appears to be doing decently well in regard to her wounds. The skin tears actually are healing quite nicely especially the elbow and very pleased in that regard. With that being said she is not having any significant pain here. In regard to her foot ulcer this also seems to be doing well I do not see any signs of active infection at this time that resolved. No fever chills noted Electronic Signature(s) Signed: 08/21/2020 3:07:56 PM By: Worthy Keeler PA-C Entered By: Worthy Keeler on 08/21/2020 15:07:56 ANKITA, FABIANO ([id number]) [name], [name] ([id number]) -------------------------------------------------------------------------------- Physical Exam Details Patient Name: [name], [name]. Date of Service: 08/21/2020 11:15 AM Medical Record Number: [id number] Patient Account Number: [id number] Date of Birth/Sex: 1929/07/14 (87 y.o. F) Treating RN: Donnamarie Poag Primary Care Provider: Ria Bush Other Clinician: Referring Provider: Ria Bush Treating Provider/Extender: Skipper Cliche in Treatment: 71 Constitutional Well-nourished and well-hydrated in no acute distress. Respiratory normal breathing without difficulty. Psychiatric this patient is able to make decisions and demonstrates good insight into disease process. Alert and Oriented x 3. pleasant and cooperative. Notes Upon inspection patient's wound bed actually showed signs of doing fairly well in regard to the foot there was no significant opening there was a tiny pinpoint with little bit of fluid but nothing terrible here. With that being said in regard to the patient's skin tears these are showing signs of excellent improvement and very pleased with where things stand today. Electronic Signature(s) Signed: 08/21/2020 3:08:53 PM By: Worthy Keeler PA-C Entered By: Worthy Keeler on 08/21/2020 15:08:52 [name]  ([id number]) -------------------------------------------------------------------------------- Physician Orders Details Patient Name: [name] Date of Service:  08/21/2020 11:15 AM Medical Record Number: [id number] Patient Account Number: [id number] Date of Birth/Sex: 1930-02-12 (87 y.o. F) Treating RN: Donnamarie Poag Primary Care Provider: Ria Bush Other Clinician: Referring Provider: Ria Bush Treating Provider/Extender: Skipper Cliche in Treatment: 52 Verbal / Phone Orders: No Diagnosis Coding ICD-10 Coding Code Description I73.89 Other specified peripheral vascular diseases L97.512 Non-pressure chronic ulcer of other part of right foot with fat layer exposed M86.671 Other chronic osteomyelitis, right ankle and foot S51.001A Unspecified open wound of right elbow, initial encounter S51.802A Unspecified open wound of left forearm, initial encounter Z79.01 Long term (current) use of anticoagulants I48.0 Paroxysmal atrial fibrillation B35.3 Tinea pedis Follow-up Appointments o Return Appointment in 2 weeks. [place]: - [place] for wound care. May utilize formulary equivalent dressing for wound treatment orders unless otherwise specified. Home Health Nurse may visit PRN to address patientos wound care needs. o Scheduled days for dressing changes to be completed; exception, patient has scheduled wound care visit that day. o **Please direct any NON-WOUND related issues/requests for orders to patient's Primary Care Physician. **If current dressing causes regression in wound condition, may D/C ordered dressing product/s and apply Normal Saline Moist Dressing daily until next Daniel or Other MD appointment. **Notify [facility] of regression in wound condition at [phone]. Medications-Please add to medication list. o P.O. Antibiotics - Doxycycline until complete Wound  Treatment Wound #3 - Metatarsal head fifth Wound Laterality: Right Cleanser: Normal Saline (Generic) 1 x Per Day/15 Days Discharge Instructions: Wash your hands with soap and water. Remove old dressing, discard into plastic bag and place into trash. Cleanse the wound with Normal Saline prior to applying a clean dressing using gauze sponges, not tissues or cotton balls. Do not scrub or use excessive force. Pat dry using gauze sponges, not tissue or cotton balls. Topical: Mupirocin Ointment 1 x Per Day/15 Days Discharge Instructions: Apply thin layer to area Secondary Dressing: Coverlet Latex-Free Fabric Adhesive Dressings 3 x Per Week/30 Days Discharge Instructions: Knuckle Wound #5 - Forearm Wound Laterality: Left, Anterior, Distal Cleanser: Normal Saline 1 x Per Day/15 Days Discharge Instructions: Wash your hands with soap and water. Remove old dressing, discard into plastic bag and place into trash. Cleanse the wound with Normal Saline prior to applying a clean dressing using gauze sponges, not tissues or cotton balls. Do not scrub or use excessive force. Pat dry using gauze sponges, not tissue or cotton balls. Primary Dressing: Xeroform-HBD 2x2 (in/in) 1 x Per Day/15 Days Discharge Instructions: Apply Xeroform-HBD 2x2 (in/in) as directed Secondary Dressing: ABD Pad 5x9 (in/in) 1 x Per Day/15 Days Discharge Instructions: Cover with ABD pad Secured With: Stretch Net Size 10 (yds) 1 x Per Day/15 Days TIJA, GUILLIAMS ([id number]) Discharge Instructions: To secure dressing in place. Wound #6 - Elbow Wound Laterality: Right, Lateral, Anterior Cleanser: Normal Saline 1 x Per Day/15 Days Discharge Instructions: Wash your hands with soap and water. Remove old dressing, discard into plastic bag and place into trash. Cleanse the wound with Normal Saline prior to applying a clean dressing using gauze sponges, not tissues or cotton balls. Do not scrub or use excessive force. Pat dry using gauze  sponges, not tissue or cotton balls. Primary Dressing: Xeroform-HBD 2x2 (in/in) 1 x Per Day/15 Days Discharge Instructions: Apply Xeroform-HBD 2x2 (in/in) as directed Secondary Dressing: ABD Pad 5x9 (in/in) 1 x Per Day/15 Days Discharge Instructions: Cover with ABD pad Secured With: Stretch Net Size 10 (yds)  1 x Per ZOX/09 Days Discharge Instructions: To secure dressing in place. Notes Do NOT use bandaids ON ARMS------ Electronic Signature(s) Signed: 08/21/2020 5:15:41 PM By: Worthy Keeler PA-C Signed: 08/22/2020 2:35:55 PM By: Donnamarie Poag Entered By: Donnamarie Poag on 08/21/2020 12:21:28 [name] ([id number]) -------------------------------------------------------------------------------- Problem List Details Patient Name: [name], [name]. Date of Service: 08/21/2020 11:15 AM Medical Record Number: [id number] Patient Account Number: [id number] Date of Birth/Sex: 1929/05/05 (87 y.o. F) Treating RN: Donnamarie Poag Primary Care Provider: Ria Bush Other Clinician: Referring Provider: Ria Bush Treating Provider/Extender: Skipper Cliche in Treatment: 4 Active Problems ICD-10 Encounter Code Description Active Date MDM Diagnosis I73.89 Other specified peripheral vascular diseases 10/12/2019 No Yes L97.512 Non-pressure chronic ulcer of other part of right foot with fat layer 10/12/2019 No Yes exposed M86.671 Other chronic osteomyelitis, right ankle and foot 07/25/2020 No Yes S51.001A Unspecified open wound of right elbow, initial encounter 08/15/2020 No Yes S51.802A Unspecified open wound of left forearm, initial encounter 08/15/2020 No Yes Z79.01 Long term (current) use of anticoagulants 10/12/2019 No Yes I48.0 Paroxysmal atrial fibrillation 10/12/2019 No Yes B35.3 Tinea pedis 12/01/2019 No Yes Inactive Problems Resolved Problems ICD-10 Code Description Active Date Resolved Date S41.102A Unspecified open wound of left upper arm, initial encounter 01/28/2020  01/28/2020 Electronic Signature(s) Signed: 08/21/2020 11:28:45 AM By: Worthy Keeler PA-C Entered By: Worthy Keeler on 08/21/2020 11:28:45 [name] ([id number]) -------------------------------------------------------------------------------- Progress Note Details Patient Name: [name]. Date of Service: 08/21/2020 11:15 AM Medical Record Number: [id number] Patient Account Number: [id number] Date of Birth/Sex: [birth year] (87 y.o. F) Treating RN: Donnamarie Poag Primary Care Provider: Ria Bush Other Clinician: Referring Provider: Ria Bush Treating Provider/Extender: Skipper Cliche in Treatment: 43 Subjective Chief Complaint Information obtained from Patient Right Lateral Foot Ulcer and Right Elbow and Left Forearm Skin Tears History of Present Illness (HPI) 10/12/2019 upon evaluation today patient appears to be doing somewhat poorly in regard to her wound at this point. She has a ulcer over the right fifth metatarsal region. This has been managed by podiatry up to this point. Unfortunately the patient just does not seem to be making the progress that they were hoping for an subsequently the patient was referred to [place] by [facility] Dr. Posey Pronto. With that being said I did note that the patient does have peripheral vascular disease which has been managed by Dr. Lazaro Arms her most recent angiogram with intervention was on 08/02/2019. The ABIs following were on the right 1.30 with a TBI of 0.61 normal left 1.31 with a TBI of 0.58. Obviously things seem to be doing somewhat better at this point hopefully enough to allow her to heal. She is on anticoagulant therapy due to atrial fibrillation long-term. With that being said the patient is unfortunately having quite a bit of discomfort at this time secondary to the wound. She also has erythema which is very likely secondary to infection. I am going obtain a culture of the area today once debridement is complete.  Apparently the patient also had a x-ray that was performed by Dr. Posey Pronto on 09/21/2019 and this showed that the patient had no obvious signs on x-ray of cortical irregularity and no osteomyelitis obviously noted. With that being said as I discussed with the patient's daughter as well this does not indicate that the patient absolutely has no osteomyelitis as x-rays are only at best 50% helpful in identifying this complication. Nonetheless I do believe that we need to have the patient  continue to have this further evaluated specifically I am to recommend that we check into an MRI which I think would be ideal to evaluate whether or not there is osteomyelitis present. 7/16; this is a patient with an ulcer over the right fifth metatarsal head. She follows with Dr. Lucky Cowboy at [facility]. She is due to have an MRI of the foot on 7/26. Using Santyl to the wound 11/04/2019 on evaluation today patient appears to be doing okay in regard to her wound at this time. Unfortunately since I last saw her which was actually her second visit here in the clinic she has moved into a new apartment and then subsequently had a fall. She ended up in the hospital she did fracture her left arm and subsequently has also been dealing with a lot of dizziness she has been at [employer]. She tells me that getting in the change the dressing has been somewhat difficult as far as daily dressing changes are concerned with the Santyl. Nonetheless when they do change it that is what is been used and her daughter-in-law has also been coming in to help out as well with the dressing changes to try to make sure it gets done daily. Fortunately there is no signs of active infection at this time. No fever chills noted. I do believe the Santyl has been beneficial and is likely still the best way to go at this point for the patient. I did review the patient's MRI which showed some potential for reactive osteitis but did not show any signs  of osteomyelitis overtly. That cannot be excluded but again right now it does not appear that is very likely present as well. This is good news. I also did review her culture she has been on the Bactrim she tells me she has taken this and again that was appropriate based on the results of her culture. 11/19/2019 upon evaluation today patient appears to be doing decently well in regard to her wound. Fortunately there is no signs of active infection at this time wound is not significantly larger in fact is roughly about the same at this point. With that being said there is not as much erythema as previously noted I think the infection seems to be under much better control which is great news. Unfortunately she has fallen since I last saw her and broken her left arm she has an appointment with them following the appointment with me. 8/25; patient was worked in acutely today at the request of home health and was concerned about her right heel, generalized erythema in her feet bilaterally. According to her daughter things have been getting worse over the last for 5 days but not precisely from a wound care point of view. Her wound is on the lateral part of the right fifth metatarsal head looks about the same as I remember this. She has developed dryness and flaking of the skin on her plantar feet. As well a very tender area localized over the insertion point of the Achilles tendon. She has restless leg syndrome. Does not specifically offload the area at night. She wears socks at home and does not really give me a pressure on the right heel issue. I reviewed her vascular status. She underwent her last revascularization on 08/02/2019 by Dr. Lucky Cowboy. This included an angioplasty of the right peroneal artery as well as the right posterior tibial artery. Her last noninvasive arterial study was on 11/23/2019. On the right her ABI was 1.08 TBI  of 0.48. On the left ABI at 1.21 with a TBI of 0.54. Family relates that Dr.  Lucky Cowboy felt that he had done the best he could and this. We have been using Santyl to the one wound she has on the right lateral fifth metatarsal head. She does not have a new wound today which is the concern that letter coming into the clinic 12/09/2019 on evaluation today patient appears to be doing decently well in regard to her foot ulcer at this point. She does note that with the compression wrap that the home health nurse put on after contacting our clinic that she actually did much better and felt much better. Her heel is also improved. Fortunately there is no signs of active infection at this time. No fevers, chills, nausea, vomiting, or diarrhea. 12/24/2019 upon evaluation today patient appears to be doing somewhat poorly still in regard to her foot. Unfortunately this is just not showing the signs of improvement that I would like to see it is a little bit cleaner but also appears to be erythematous and I believe there is infection noted at this point. We will get a need to address this in my opinion. I did actually recommend that we switch back to the Iodoflex as well apparently the home health nurse had switch this away that not really what we want to do at this point in my opinion. They were using silver alginate I think most recently. 01/06/2020 upon evaluation today patient actually appears to be doing much better in regard to her wound. We have switched back to the silver alginate after her home health nurse contacted me and honestly I feel like she is doing quite well in that regard. She does have a lot of drainage and therefore I think that is probably a better option for. With that being said she is tolerating the compression wrap without complication that [name], [name]. ([id number]) also seems to be helping. 01/28/2020 on evaluation today patient appears to be doing well with regard to her foot ulcer. I do feel like she is showing signs of improvement which is great news. There is no  signs of active infection at this time which is also good news. No fevers, chills, nausea, vomiting, or diarrhea. Unfortunately she did have a fall and sustained an injury to the left upper arm. She has a skin tear here this appears to be doing okay although the dressing was stuck to this this was an alginate dressing. I really think she probably needs something like Xeroform to try to prevent anything from sticking to this region. Other than that she seems to be doing quite well. 02/11/2020 on evaluation today patient's arm skin tear appears to be completely healed which is great. Unfortunately she is still having issues at this time with her foot although this seems to be showing signs of improvement it still very slow. There is no signs of active infection at this time. 03/06/2020 upon evaluation today patient appears to be doing decently well in regard to her foot ulcer. There is some minimal slough noted on the surface of the wound building up at this point. Otherwise I really feel like she is doing quite nicely with quite a bit of new granulation and epithelial growth. 03/23/2020 upon evaluation today patient appears to be doing well with regards to her wound. She has been tolerating the dressing changes without complication. Fortunately there is no evidence of active infection and overall feel like her foot ulcer is  doing excellent at this point. 04/13/2020 on evaluation today patient appears to be doing well with regard to her foot ulcer. She has been tolerating the dressing changes without complication. Fortunately there is no signs of active infection at this time. Overall I feel like the wound is getting close to complete closure and this appears to be doing quite well in my opinion. 04/27/2020 on evaluation today patient appears to be doing excellent in regard to her wound. She has been tolerating the dressing changes without complication. Fortunately there is no signs of active infection and  overall very pleased with where things stand today. In fact I feel like the wound is very close to complete resolution. 05/18/2020 upon evaluation today patient appears to be doing well with regard to her foot ulcer. There is no evidence of active infection at this time which is great news and overall very pleased with where things stand. No fevers, chills, nausea, vomiting, or diarrhea. I do feel like she is making great progress and I feel like that the wound is very tiny but again this last portion getting close has been somewhat difficult. 07/05/2020 upon evaluation today patient appears to be doing a little poorly in regard to her foot ulcer. Fortunately there is no signs of active infection at this time systemically though locally I feel it may be some cellulitis it looks like this wound may have prematurely sealed up subsequently causing a blister that had to be removed. The good news is that I do not think it is a very bad infection but I believe she may be experiencing cellulitis. 06/16/2020 upon evaluation today patient appears to be doing well with regard to her wound. It seems like it showed signs of improvement compared to last week where she had more infection noted. With that being said I do believe that based on what I am seeing the patient seems to be making excellent progress although I think still this is a small area that is going require some debridement to clear away some of the callus and drainage which is trapping fluid behind it based on what I see. 06/23/2020 upon evaluation today patient appears to be doing well with regard to her leg ulcer. She has been tolerating the dressing changes without application. And currently I do believe the alginate is doing a good job keeping it dry it still tends to cover over and we do not want her trapping any fluid therefore I do remove that outer callus/drainage covering each time she comes in. Overall I think this is doing a great  job. 06/30/2020 patient appears to be doing okay in regard to pain in regard to her wound. Unfortunately she continues to develop a callus buildup over the area. This is unfortunate as to be honest it is continuing to cause problems with her basically being able to heal as she needs to really granulate in from the bottom out on that is not happening. I think we need to find something that works better to keep this area open. 07/10/2020 upon evaluation today patient unfortunately appears to be having increased pain yet again and is not doing nearly as well as she was previous. I do believe this is indication of infection. The patient is going to require a culture today it appears. 07/18/2020 upon evaluation today patient appears to be doing well with regard to her wounds. She has been tolerating the dressing changes without complication. In fact this appears to be doing significantly better compared to  last week. I did review her however her pathology which showed acute osteomyelitis. Also did review the culture which showed Staph aureus. She is on Bactrim that does seem to be helping. I am unlikely going to continue that for a total of 2 months. I think they would prefer that over going to infectious disease. I am definitely okay with that as far as that is concerned. I am going to likely go ahead and refill the Bactrim next week for another 2 weeks supply in order to get the patient through for this month and then will have to do 1 more month for a full 8 weeks of treatment. Several of the times that I sent patients to infectious disease they just kept the patient on oral antibiotic therapy as opposed to switching IV anyway and she seems to be doing very well after just 1 week. 07/25/2020 upon evaluation today patient appears to be doing well with regard to her wound. She still has an opening and there is still some depth there as well. With that being said I do think that the patient does have  osteomyelitis and very well could be a candidate for hyperbaric oxygen therapy. However I Ernie Hew see how she does over the next 2 weeks with the oral antibiotics. Obviously if she is not close by that time I think proceeding with the HBO therapy would be appropriate. I did discuss this with the patient and her daughter today. I think that this may be something to strongly consider going forward if things do not improve over again the next 2 weeks. 08/08/2020 upon evaluation today patient appears to be doing decently well in regard to her wound. The good news is she does not appear to have any signs of infection worsening which is good she still has a small hole open here in the [facility] I think is still appropriate to try to let this granulate in from the bottom out. With that being said this is getting tiny but nonetheless is not completely resolving. Reason I did discuss the possibility of hyperbaric oxygen therapy with the patient and her daughter today again. 08/15/2020 upon evaluation today patient appears to be doing well with regard to her foot ulcer. In fact I am very pleased with where things stand in that regard. I am not even certain she is going to have to have the hyperbarics although this still something to consider depending on how things go. Overall the biggest issue today is she actually had a fall over top of her walker causing several contusions over the upper and lower extremities unfortunately she does have a new skin tear 1 in the right elbow location and one on the left forearm. 08/21/2020 upon evaluation today patient actually appears to be doing decently well in regard to her wounds. The skin tears actually are healing quite nicely especially the elbow and very pleased in that regard. With that being said she is not having any significant pain here. In regard to her foot ulcer this also seems to be doing well I do not see any signs of active infection at this time that  resolved. No fever chills noted [name], [name]. ([id number]) Objective Constitutional Well-nourished and well-hydrated in no acute distress. Vitals Time Taken: 11:53 AM, Height: 62 in, Weight: 140 lbs, BMI: 25.6, Temperature: 98.4 F, Pulse: 62 bpm, Respiratory Rate: 20 breaths/min, Blood Pressure: 184/71 mmHg. Respiratory normal breathing without difficulty. Psychiatric this patient is able to make decisions and demonstrates good  insight into disease process. Alert and Oriented x 3. pleasant and cooperative. General Notes: Upon inspection patient's wound bed actually showed signs of doing fairly well in regard to the foot there was no significant opening there was a tiny pinpoint with little bit of fluid but nothing terrible here. With that being said in regard to the patient's skin tears these are showing signs of excellent improvement and very pleased with where things stand today. Integumentary (Hair, Skin) Wound #3 status is Open. Original cause of wound was Gradually Appeared. The date acquired was: 04/09/2019. The wound has been in treatment 44 weeks. The wound is located on the Right Metatarsal head fifth. The wound measures 0.1cm length x 0.1cm width x 0.1cm depth; 0.008cm^2 area and 0.001cm^3 volume. There is Fat Layer (Subcutaneous Tissue) exposed. There is no tunneling or undermining noted. There is a small amount of serosanguineous drainage noted. The wound margin is distinct with the outline attached to the wound base. There is large (67-100%) red, pink granulation within the wound bed. There is no necrotic tissue within the wound bed. Wound #5 status is Open. Original cause of wound was Skin Tear/Laceration. The date acquired was: 08/14/2020. The wound is located on the Left,Distal,Anterior Forearm. The wound measures 1.7cm length x 0.9cm width x 0.1cm depth; 1.202cm^2 area and 0.12cm^3 volume. There is Fat Layer (Subcutaneous Tissue) exposed. There is no tunneling or undermining  noted. There is a medium amount of serosanguineous drainage noted. There is medium (34-66%) pink granulation within the wound bed. There is a medium (34-66%) amount of necrotic tissue within the wound bed including Adherent Slough. Wound #6 status is Open. Original cause of wound was Skin Tear/Laceration. The date acquired was: 08/14/2020. The wound is located on the Right,Lateral,Anterior Elbow. The wound measures 0.2cm length x 1.5cm width x 0.1cm depth; 0.236cm^2 area and 0.024cm^3 volume. There is Fat Layer (Subcutaneous Tissue) exposed. There is no tunneling or undermining noted. There is a medium amount of serosanguineous drainage noted. There is no granulation within the wound bed. There is a large (67-100%) amount of necrotic tissue within the wound bed including Eschar. Assessment Active Problems ICD-10 Other specified peripheral vascular diseases Non-pressure chronic ulcer of other part of right foot with fat layer exposed Other chronic osteomyelitis, right ankle and foot Unspecified open wound of right elbow, initial encounter Unspecified open wound of left forearm, initial encounter Long term (current) use of anticoagulants Paroxysmal atrial fibrillation Tinea pedis Plan Follow-up Appointments: Return Appointment in 2 weeks. [facility] Health[facility]. May utilize formulary equivalent dressing for wound treatment orders unless otherwise specified. [facility], Sohana S. ([id number]) Health Nurse may visit PRN to address patient s wound care needs. Scheduled days for dressing changes to be completed; exception, patient has scheduled wound care visit that day. **Please direct any NON-WOUND related issues/requests for orders to patient's Primary Care Physician. **If current dressing causes regression in wound condition, may D/C ordered dressing product/s and apply Normal Saline Moist Dressing daily until next Wound Healing Center  or Other MD appointment. **Notify Wound Healing Center of regression in wound condition at [phone]. Medications-Please add to medication list.: P.O. Antibiotics - Doxycycline until complete General Notes: Do NOT use bandaids ON ARMS------ WOUND #3: - Metatarsal head fifth Wound Laterality: Right Cleanser: Normal Saline (Generic) 1 x Per Day/15 Days Discharge Instructions: Wash your hands with soap and water. Remove old dressing, discard into plastic bag and place into trash. Cleanse the  wound with Normal Saline prior to applying a clean dressing using gauze sponges, not tissues or cotton balls. Do not scrub or use excessive force. Pat dry using gauze sponges, not tissue or cotton balls. Topical: Mupirocin Ointment 1 x Per Day/15 Days Discharge Instructions: Apply thin layer to area Secondary Dressing: Coverlet Latex-Free Fabric Adhesive Dressings 3 x Per Week/30 Days Discharge Instructions: Knuckle WOUND #5: - Forearm Wound Laterality: Left, Anterior, Distal Cleanser: Normal Saline 1 x Per Day/15 Days Discharge Instructions: Wash your hands with soap and water. Remove old dressing, discard into plastic bag and place into trash. Cleanse the wound with Normal Saline prior to applying a clean dressing using gauze sponges, not tissues or cotton balls. Do not scrub or use excessive force. Pat dry using gauze sponges, not tissue or cotton balls. Primary Dressing: Xeroform-HBD 2x2 (in/in) 1 x Per Day/15 Days Discharge Instructions: Apply Xeroform-HBD 2x2 (in/in) as directed Secondary Dressing: ABD Pad 5x9 (in/in) 1 x Per Day/15 Days Discharge Instructions: Cover with ABD pad Secured With: Stretch Net Size 10 (yds) 1 x Per Day/15 Days Discharge Instructions: To secure dressing in place. WOUND #6: - Elbow Wound Laterality: Right, Lateral, Anterior Cleanser: Normal Saline 1 x Per Day/15 Days Discharge Instructions: Wash your hands with soap and water. Remove old dressing, discard into  plastic bag and place into trash. Cleanse the wound with Normal Saline prior to applying a clean dressing using gauze sponges, not tissues or cotton balls. Do not scrub or use excessive force. Pat dry using gauze sponges, not tissue or cotton balls. Primary Dressing: Xeroform-HBD 2x2 (in/in) 1 x Per Day/15 Days Discharge Instructions: Apply Xeroform-HBD 2x2 (in/in) as directed Secondary Dressing: ABD Pad 5x9 (in/in) 1 x Per Day/15 Days Discharge Instructions: Cover with ABD pad Secured With: Stretch Net Size 10 (yds) 1 x Per Day/15 Days Discharge Instructions: To secure dressing in place. 1. Would recommend currently in regard to the foot that we have the patient go ahead and continue with the mupirocin ointment I think this is doing a good job. 2. I am also can recommend that we continue with the Xeroform for the skin tears that seems to be doing well. The patient should be using an ABD pad and stretch net to secure in place over the Xeroform. She can use a Band-Aid over the foot to hold the dressing in place. We will see patient back for reevaluation in 2 weeks here in the clinic. If anything worsens or changes patient will contact our office for additional recommendations. Electronic Signature(s) Signed: 08/21/2020 3:09:43 PM By: Worthy Keeler PA-C Entered By: Worthy Keeler on 08/21/2020 15:09:42 [name] ([id number]) -------------------------------------------------------------------------------- SuperBill Details Patient Name: [name] Date of Service: 08/21/2020 Medical Record Number: [id number] Patient Account Number: [id number] Date of Birth/Sex: September 09, 1929 (87 y.o. F) Treating RN: Donnamarie Poag Primary Care Provider: Ria Bush Other Clinician: Referring Provider: Ria Bush Treating Provider/Extender: Skipper Cliche in Treatment: 44 Diagnosis Coding ICD-10 Codes Code Description I73.89 Other specified peripheral vascular diseases L97.512  Non-pressure chronic ulcer of other part of right foot with fat layer exposed M86.671 Other chronic osteomyelitis, right ankle and foot S51.001A Unspecified open wound of right elbow, initial encounter S51.802A Unspecified open wound of left forearm, initial encounter Z79.01 Long term (current) use of anticoagulants I48.0 Paroxysmal atrial fibrillation B35.3 Tinea pedis Facility Procedures CPT4 Code: [id number] Description: 99213 - WOUND CARE VISIT-LEV 3 EST PT Modifier: Quantity: 1 Physician Procedures CPT4 Code: [id number] Description: 99213 - WC PHYS  LEVEL 3 - EST PT Modifier: Quantity: 1 CPT4 Code: Description: ICD-10 Diagnosis Description I73.89 Other specified peripheral vascular diseases L97.512 Non-pressure chronic ulcer of other part of right foot with fat layer e M86.671 Other chronic osteomyelitis, right ankle and foot S51.001A Unspecified  open wound of right elbow, initial encounter Modifier: xposed Quantity: Electronic Signature(s) Signed: 08/21/2020 3:10:07 PM By: Worthy Keeler PA-C Entered By: Worthy Keeler on 08/21/2020 15:10:06

## 2020-08-21 NOTE — Telephone Encounter (Signed)
Spoke with pt's daughter, [name], relaying Dr. Synthia Innocent message.  She verbalizes understanding and states pharmacy did have 400 mg cap, so they are good.

## 2020-08-24 DIAGNOSIS — Z9181 History of falling: Secondary | ICD-10-CM | POA: Diagnosis not present

## 2020-08-24 DIAGNOSIS — E7849 Other hyperlipidemia: Secondary | ICD-10-CM | POA: Diagnosis not present

## 2020-08-24 DIAGNOSIS — K219 Gastro-esophageal reflux disease without esophagitis: Secondary | ICD-10-CM | POA: Diagnosis not present

## 2020-08-24 DIAGNOSIS — Z792 Long term (current) use of antibiotics: Secondary | ICD-10-CM | POA: Diagnosis not present

## 2020-08-24 DIAGNOSIS — M1990 Unspecified osteoarthritis, unspecified site: Secondary | ICD-10-CM | POA: Diagnosis not present

## 2020-08-24 DIAGNOSIS — I872 Venous insufficiency (chronic) (peripheral): Secondary | ICD-10-CM | POA: Diagnosis not present

## 2020-08-24 DIAGNOSIS — Z7982 Long term (current) use of aspirin: Secondary | ICD-10-CM | POA: Diagnosis not present

## 2020-08-24 DIAGNOSIS — I2510 Atherosclerotic heart disease of native coronary artery without angina pectoris: Secondary | ICD-10-CM | POA: Diagnosis not present

## 2020-08-24 DIAGNOSIS — I4891 Unspecified atrial fibrillation: Secondary | ICD-10-CM | POA: Diagnosis not present

## 2020-08-24 DIAGNOSIS — Z7901 Long term (current) use of anticoagulants: Secondary | ICD-10-CM | POA: Diagnosis not present

## 2020-08-24 DIAGNOSIS — M5136 Other intervertebral disc degeneration, lumbar region: Secondary | ICD-10-CM | POA: Diagnosis not present

## 2020-08-24 DIAGNOSIS — G2581 Restless legs syndrome: Secondary | ICD-10-CM | POA: Diagnosis not present

## 2020-08-24 DIAGNOSIS — L97513 Non-pressure chronic ulcer of other part of right foot with necrosis of muscle: Secondary | ICD-10-CM | POA: Diagnosis not present

## 2020-08-24 DIAGNOSIS — I5032 Chronic diastolic (congestive) heart failure: Secondary | ICD-10-CM | POA: Diagnosis not present

## 2020-08-24 DIAGNOSIS — I130 Hypertensive heart and chronic kidney disease with heart failure and stage 1 through stage 4 chronic kidney disease, or unspecified chronic kidney disease: Secondary | ICD-10-CM | POA: Diagnosis not present

## 2020-08-24 DIAGNOSIS — E538 Deficiency of other specified B group vitamins: Secondary | ICD-10-CM | POA: Diagnosis not present

## 2020-08-24 DIAGNOSIS — E559 Vitamin D deficiency, unspecified: Secondary | ICD-10-CM | POA: Diagnosis not present

## 2020-08-24 DIAGNOSIS — M810 Age-related osteoporosis without current pathological fracture: Secondary | ICD-10-CM | POA: Diagnosis not present

## 2020-08-24 DIAGNOSIS — N1831 Chronic kidney disease, stage 3a: Secondary | ICD-10-CM | POA: Diagnosis not present

## 2020-08-24 DIAGNOSIS — I272 Pulmonary hypertension, unspecified: Secondary | ICD-10-CM | POA: Diagnosis not present

## 2020-08-24 DIAGNOSIS — S62102D Fracture of unspecified carpal bone, left wrist, subsequent encounter for fracture with routine healing: Secondary | ICD-10-CM | POA: Diagnosis not present

## 2020-08-24 DIAGNOSIS — G9009 Other idiopathic peripheral autonomic neuropathy: Secondary | ICD-10-CM | POA: Diagnosis not present

## 2020-08-28 DIAGNOSIS — Z792 Long term (current) use of antibiotics: Secondary | ICD-10-CM | POA: Diagnosis not present

## 2020-08-28 DIAGNOSIS — Z7982 Long term (current) use of aspirin: Secondary | ICD-10-CM | POA: Diagnosis not present

## 2020-08-28 DIAGNOSIS — Z9181 History of falling: Secondary | ICD-10-CM | POA: Diagnosis not present

## 2020-08-28 DIAGNOSIS — G2581 Restless legs syndrome: Secondary | ICD-10-CM | POA: Diagnosis not present

## 2020-08-28 DIAGNOSIS — M1990 Unspecified osteoarthritis, unspecified site: Secondary | ICD-10-CM | POA: Diagnosis not present

## 2020-08-28 DIAGNOSIS — M810 Age-related osteoporosis without current pathological fracture: Secondary | ICD-10-CM | POA: Diagnosis not present

## 2020-08-28 DIAGNOSIS — E559 Vitamin D deficiency, unspecified: Secondary | ICD-10-CM | POA: Diagnosis not present

## 2020-08-28 DIAGNOSIS — K219 Gastro-esophageal reflux disease without esophagitis: Secondary | ICD-10-CM | POA: Diagnosis not present

## 2020-08-28 DIAGNOSIS — N1831 Chronic kidney disease, stage 3a: Secondary | ICD-10-CM | POA: Diagnosis not present

## 2020-08-28 DIAGNOSIS — S62102D Fracture of unspecified carpal bone, left wrist, subsequent encounter for fracture with routine healing: Secondary | ICD-10-CM | POA: Diagnosis not present

## 2020-08-28 DIAGNOSIS — L97513 Non-pressure chronic ulcer of other part of right foot with necrosis of muscle: Secondary | ICD-10-CM | POA: Diagnosis not present

## 2020-08-28 DIAGNOSIS — I2510 Atherosclerotic heart disease of native coronary artery without angina pectoris: Secondary | ICD-10-CM | POA: Diagnosis not present

## 2020-08-28 DIAGNOSIS — I872 Venous insufficiency (chronic) (peripheral): Secondary | ICD-10-CM | POA: Diagnosis not present

## 2020-08-28 DIAGNOSIS — E7849 Other hyperlipidemia: Secondary | ICD-10-CM | POA: Diagnosis not present

## 2020-08-28 DIAGNOSIS — Z7901 Long term (current) use of anticoagulants: Secondary | ICD-10-CM | POA: Diagnosis not present

## 2020-08-28 DIAGNOSIS — I5032 Chronic diastolic (congestive) heart failure: Secondary | ICD-10-CM | POA: Diagnosis not present

## 2020-08-28 DIAGNOSIS — I130 Hypertensive heart and chronic kidney disease with heart failure and stage 1 through stage 4 chronic kidney disease, or unspecified chronic kidney disease: Secondary | ICD-10-CM | POA: Diagnosis not present

## 2020-08-28 DIAGNOSIS — E538 Deficiency of other specified B group vitamins: Secondary | ICD-10-CM | POA: Diagnosis not present

## 2020-08-28 DIAGNOSIS — G9009 Other idiopathic peripheral autonomic neuropathy: Secondary | ICD-10-CM | POA: Diagnosis not present

## 2020-08-28 DIAGNOSIS — I272 Pulmonary hypertension, unspecified: Secondary | ICD-10-CM | POA: Diagnosis not present

## 2020-08-28 DIAGNOSIS — M5136 Other intervertebral disc degeneration, lumbar region: Secondary | ICD-10-CM | POA: Diagnosis not present

## 2020-08-28 DIAGNOSIS — I4891 Unspecified atrial fibrillation: Secondary | ICD-10-CM | POA: Diagnosis not present

## 2020-08-29 ENCOUNTER — Telehealth: Payer: Self-pay

## 2020-08-29 ENCOUNTER — Encounter: Payer: Medicare Other | Admitting: Internal Medicine

## 2020-08-29 NOTE — Telephone Encounter (Signed)
Signed and in [name]'s box.

## 2020-08-29 NOTE — Telephone Encounter (Signed)
Received faxed [facility] for wound care from [facility].  Placed order in Dr. Synthia Innocent box.

## 2020-08-29 NOTE — Telephone Encounter (Signed)
Faxed order

## 2020-08-31 DIAGNOSIS — I4891 Unspecified atrial fibrillation: Secondary | ICD-10-CM | POA: Diagnosis not present

## 2020-08-31 DIAGNOSIS — I272 Pulmonary hypertension, unspecified: Secondary | ICD-10-CM | POA: Diagnosis not present

## 2020-08-31 DIAGNOSIS — Z7901 Long term (current) use of anticoagulants: Secondary | ICD-10-CM | POA: Diagnosis not present

## 2020-08-31 DIAGNOSIS — L97513 Non-pressure chronic ulcer of other part of right foot with necrosis of muscle: Secondary | ICD-10-CM | POA: Diagnosis not present

## 2020-08-31 DIAGNOSIS — E538 Deficiency of other specified B group vitamins: Secondary | ICD-10-CM | POA: Diagnosis not present

## 2020-08-31 DIAGNOSIS — Z792 Long term (current) use of antibiotics: Secondary | ICD-10-CM | POA: Diagnosis not present

## 2020-08-31 DIAGNOSIS — K219 Gastro-esophageal reflux disease without esophagitis: Secondary | ICD-10-CM | POA: Diagnosis not present

## 2020-08-31 DIAGNOSIS — G9009 Other idiopathic peripheral autonomic neuropathy: Secondary | ICD-10-CM | POA: Diagnosis not present

## 2020-08-31 DIAGNOSIS — M1990 Unspecified osteoarthritis, unspecified site: Secondary | ICD-10-CM | POA: Diagnosis not present

## 2020-08-31 DIAGNOSIS — Z9181 History of falling: Secondary | ICD-10-CM | POA: Diagnosis not present

## 2020-08-31 DIAGNOSIS — M5136 Other intervertebral disc degeneration, lumbar region: Secondary | ICD-10-CM | POA: Diagnosis not present

## 2020-08-31 DIAGNOSIS — I5032 Chronic diastolic (congestive) heart failure: Secondary | ICD-10-CM | POA: Diagnosis not present

## 2020-08-31 DIAGNOSIS — S62102D Fracture of unspecified carpal bone, left wrist, subsequent encounter for fracture with routine healing: Secondary | ICD-10-CM | POA: Diagnosis not present

## 2020-08-31 DIAGNOSIS — N1831 Chronic kidney disease, stage 3a: Secondary | ICD-10-CM | POA: Diagnosis not present

## 2020-08-31 DIAGNOSIS — Z7982 Long term (current) use of aspirin: Secondary | ICD-10-CM | POA: Diagnosis not present

## 2020-08-31 DIAGNOSIS — I130 Hypertensive heart and chronic kidney disease with heart failure and stage 1 through stage 4 chronic kidney disease, or unspecified chronic kidney disease: Secondary | ICD-10-CM | POA: Diagnosis not present

## 2020-08-31 DIAGNOSIS — M810 Age-related osteoporosis without current pathological fracture: Secondary | ICD-10-CM | POA: Diagnosis not present

## 2020-08-31 DIAGNOSIS — E559 Vitamin D deficiency, unspecified: Secondary | ICD-10-CM | POA: Diagnosis not present

## 2020-08-31 DIAGNOSIS — G2581 Restless legs syndrome: Secondary | ICD-10-CM | POA: Diagnosis not present

## 2020-08-31 DIAGNOSIS — I872 Venous insufficiency (chronic) (peripheral): Secondary | ICD-10-CM | POA: Diagnosis not present

## 2020-08-31 DIAGNOSIS — E7849 Other hyperlipidemia: Secondary | ICD-10-CM | POA: Diagnosis not present

## 2020-08-31 DIAGNOSIS — I2510 Atherosclerotic heart disease of native coronary artery without angina pectoris: Secondary | ICD-10-CM | POA: Diagnosis not present

## 2020-09-04 DIAGNOSIS — Z792 Long term (current) use of antibiotics: Secondary | ICD-10-CM | POA: Diagnosis not present

## 2020-09-04 DIAGNOSIS — G2581 Restless legs syndrome: Secondary | ICD-10-CM | POA: Diagnosis not present

## 2020-09-04 DIAGNOSIS — Z7982 Long term (current) use of aspirin: Secondary | ICD-10-CM | POA: Diagnosis not present

## 2020-09-04 DIAGNOSIS — I2510 Atherosclerotic heart disease of native coronary artery without angina pectoris: Secondary | ICD-10-CM | POA: Diagnosis not present

## 2020-09-04 DIAGNOSIS — E559 Vitamin D deficiency, unspecified: Secondary | ICD-10-CM | POA: Diagnosis not present

## 2020-09-04 DIAGNOSIS — S62102D Fracture of unspecified carpal bone, left wrist, subsequent encounter for fracture with routine healing: Secondary | ICD-10-CM | POA: Diagnosis not present

## 2020-09-04 DIAGNOSIS — K219 Gastro-esophageal reflux disease without esophagitis: Secondary | ICD-10-CM | POA: Diagnosis not present

## 2020-09-04 DIAGNOSIS — E538 Deficiency of other specified B group vitamins: Secondary | ICD-10-CM | POA: Diagnosis not present

## 2020-09-04 DIAGNOSIS — L97513 Non-pressure chronic ulcer of other part of right foot with necrosis of muscle: Secondary | ICD-10-CM | POA: Diagnosis not present

## 2020-09-04 DIAGNOSIS — M1990 Unspecified osteoarthritis, unspecified site: Secondary | ICD-10-CM | POA: Diagnosis not present

## 2020-09-04 DIAGNOSIS — M810 Age-related osteoporosis without current pathological fracture: Secondary | ICD-10-CM | POA: Diagnosis not present

## 2020-09-04 DIAGNOSIS — G9009 Other idiopathic peripheral autonomic neuropathy: Secondary | ICD-10-CM | POA: Diagnosis not present

## 2020-09-04 DIAGNOSIS — I5032 Chronic diastolic (congestive) heart failure: Secondary | ICD-10-CM | POA: Diagnosis not present

## 2020-09-04 DIAGNOSIS — I130 Hypertensive heart and chronic kidney disease with heart failure and stage 1 through stage 4 chronic kidney disease, or unspecified chronic kidney disease: Secondary | ICD-10-CM | POA: Diagnosis not present

## 2020-09-04 DIAGNOSIS — E7849 Other hyperlipidemia: Secondary | ICD-10-CM | POA: Diagnosis not present

## 2020-09-04 DIAGNOSIS — M5136 Other intervertebral disc degeneration, lumbar region: Secondary | ICD-10-CM | POA: Diagnosis not present

## 2020-09-04 DIAGNOSIS — N1831 Chronic kidney disease, stage 3a: Secondary | ICD-10-CM | POA: Diagnosis not present

## 2020-09-04 DIAGNOSIS — Z7901 Long term (current) use of anticoagulants: Secondary | ICD-10-CM | POA: Diagnosis not present

## 2020-09-04 DIAGNOSIS — I4891 Unspecified atrial fibrillation: Secondary | ICD-10-CM | POA: Diagnosis not present

## 2020-09-04 DIAGNOSIS — I872 Venous insufficiency (chronic) (peripheral): Secondary | ICD-10-CM | POA: Diagnosis not present

## 2020-09-04 DIAGNOSIS — I272 Pulmonary hypertension, unspecified: Secondary | ICD-10-CM | POA: Diagnosis not present

## 2020-09-04 DIAGNOSIS — Z9181 History of falling: Secondary | ICD-10-CM | POA: Diagnosis not present

## 2020-09-05 ENCOUNTER — Encounter: Payer: Medicare Other | Admitting: Physician Assistant

## 2020-09-05 ENCOUNTER — Other Ambulatory Visit: Payer: Self-pay

## 2020-09-05 DIAGNOSIS — E559 Vitamin D deficiency, unspecified: Secondary | ICD-10-CM | POA: Diagnosis not present

## 2020-09-05 DIAGNOSIS — I739 Peripheral vascular disease, unspecified: Secondary | ICD-10-CM | POA: Diagnosis not present

## 2020-09-05 DIAGNOSIS — Z7901 Long term (current) use of anticoagulants: Secondary | ICD-10-CM | POA: Diagnosis not present

## 2020-09-05 DIAGNOSIS — K219 Gastro-esophageal reflux disease without esophagitis: Secondary | ICD-10-CM | POA: Diagnosis not present

## 2020-09-05 DIAGNOSIS — Z7982 Long term (current) use of aspirin: Secondary | ICD-10-CM | POA: Diagnosis not present

## 2020-09-05 DIAGNOSIS — M810 Age-related osteoporosis without current pathological fracture: Secondary | ICD-10-CM | POA: Diagnosis not present

## 2020-09-05 DIAGNOSIS — I5032 Chronic diastolic (congestive) heart failure: Secondary | ICD-10-CM | POA: Diagnosis not present

## 2020-09-05 DIAGNOSIS — L97513 Non-pressure chronic ulcer of other part of right foot with necrosis of muscle: Secondary | ICD-10-CM | POA: Diagnosis not present

## 2020-09-05 DIAGNOSIS — I872 Venous insufficiency (chronic) (peripheral): Secondary | ICD-10-CM | POA: Diagnosis not present

## 2020-09-05 DIAGNOSIS — G2581 Restless legs syndrome: Secondary | ICD-10-CM | POA: Diagnosis not present

## 2020-09-05 DIAGNOSIS — I272 Pulmonary hypertension, unspecified: Secondary | ICD-10-CM | POA: Diagnosis not present

## 2020-09-05 DIAGNOSIS — B353 Tinea pedis: Secondary | ICD-10-CM | POA: Diagnosis not present

## 2020-09-05 DIAGNOSIS — E538 Deficiency of other specified B group vitamins: Secondary | ICD-10-CM | POA: Diagnosis not present

## 2020-09-05 DIAGNOSIS — S62102D Fracture of unspecified carpal bone, left wrist, subsequent encounter for fracture with routine healing: Secondary | ICD-10-CM | POA: Diagnosis not present

## 2020-09-05 DIAGNOSIS — Z9181 History of falling: Secondary | ICD-10-CM | POA: Diagnosis not present

## 2020-09-05 DIAGNOSIS — M5136 Other intervertebral disc degeneration, lumbar region: Secondary | ICD-10-CM | POA: Diagnosis not present

## 2020-09-05 DIAGNOSIS — I2510 Atherosclerotic heart disease of native coronary artery without angina pectoris: Secondary | ICD-10-CM | POA: Diagnosis not present

## 2020-09-05 DIAGNOSIS — I480 Paroxysmal atrial fibrillation: Secondary | ICD-10-CM | POA: Diagnosis not present

## 2020-09-05 DIAGNOSIS — Z792 Long term (current) use of antibiotics: Secondary | ICD-10-CM | POA: Diagnosis not present

## 2020-09-05 DIAGNOSIS — I130 Hypertensive heart and chronic kidney disease with heart failure and stage 1 through stage 4 chronic kidney disease, or unspecified chronic kidney disease: Secondary | ICD-10-CM | POA: Diagnosis not present

## 2020-09-05 DIAGNOSIS — M86671 Other chronic osteomyelitis, right ankle and foot: Secondary | ICD-10-CM | POA: Diagnosis not present

## 2020-09-05 DIAGNOSIS — N1831 Chronic kidney disease, stage 3a: Secondary | ICD-10-CM | POA: Diagnosis not present

## 2020-09-05 DIAGNOSIS — E7849 Other hyperlipidemia: Secondary | ICD-10-CM | POA: Diagnosis not present

## 2020-09-05 DIAGNOSIS — L97512 Non-pressure chronic ulcer of other part of right foot with fat layer exposed: Secondary | ICD-10-CM | POA: Diagnosis not present

## 2020-09-05 DIAGNOSIS — S51011A Laceration without foreign body of right elbow, initial encounter: Secondary | ICD-10-CM | POA: Diagnosis not present

## 2020-09-05 DIAGNOSIS — G9009 Other idiopathic peripheral autonomic neuropathy: Secondary | ICD-10-CM | POA: Diagnosis not present

## 2020-09-05 DIAGNOSIS — I4891 Unspecified atrial fibrillation: Secondary | ICD-10-CM | POA: Diagnosis not present

## 2020-09-05 DIAGNOSIS — M1990 Unspecified osteoarthritis, unspecified site: Secondary | ICD-10-CM | POA: Diagnosis not present

## 2020-09-05 DIAGNOSIS — S51812A Laceration without foreign body of left forearm, initial encounter: Secondary | ICD-10-CM | POA: Diagnosis not present

## 2020-09-05 NOTE — Progress Notes (Signed)
[name], [name] ([id number]) Visit Report for 09/05/2020 Arrival Information Details Patient Name: [name], [name]. Date of Service: 09/05/2020 11:15 AM Medical Record Number: [id number] Patient Account Number: [id number] Date of Birth/Sex: [birth year] (87 y.o. F) Treating RN: Carlene Coria Primary Care Canna Nickelson: Ria Bush Other Clinician: Referring Lynann Demetrius: Ria Bush Treating Jaretzy Lhommedieu/Extender: Skipper Cliche in Treatment: 52 Visit Information History Since Last Visit All ordered tests and consults were completed: No Patient Arrived: [name] Added or deleted any medications: No Arrival Time: 11:55 Any new allergies or adverse reactions: No Accompanied By: daughter Had a fall or experienced change in No Transfer Assistance: None activities of daily living that may affect Patient Identification Verified: Yes risk of falls: Secondary Verification Process Completed: Yes Signs or symptoms of abuse/neglect since last visito No Patient Requires Transmission-Based No Hospitalized since last visit: No Precautions: Implantable device outside of the clinic excluding No Patient Has Alerts: Yes cellular tissue based products placed in the center Patient Alerts: 08/31/19 ABI R)1.3 L) since last visit: 1.31 Has Dressing in Place as Prescribed: Yes 08/31/19 TBI R).61 Pain Present Now: No L).58 Electronic Signature(s) Signed: 09/05/2020 3:50:09 PM By: Carlene Coria RN Entered By: Carlene Coria on 09/05/2020 12:00:22 [name] ([id number]) -------------------------------------------------------------------------------- Clinic Level of Care Assessment Details Patient Name: [name]. Date of Service: 09/05/2020 11:15 AM Medical Record Number: [id number] Patient Account Number: [id number] Date of Birth/Sex: 10/05/1929 (87 y.o. F) Treating RN: Dolan Amen Primary Care Trameka Dorough: Ria Bush Other Clinician: Referring Trampus Mcquerry: Ria Bush Treating  Kyleigha Markert/Extender: Skipper Cliche in Treatment: 47 Clinic Level of Care Assessment Items TOOL 4 Quantity Score X - Use when only an EandM is performed on FOLLOW-UP visit 1 0 ASSESSMENTS - Nursing Assessment / Reassessment X - Reassessment of Co-morbidities (includes updates in patient status) 1 10 X- 1 5 Reassessment of Adherence to Treatment Plan ASSESSMENTS - Wound and Skin Assessment / Reassessment []  - Simple Wound Assessment / Reassessment - one wound 0 []  - 0 Complex Wound Assessment / Reassessment - multiple wounds X- 1 10 Dermatologic / Skin Assessment (not related to wound area) ASSESSMENTS - Focused Assessment []  - Circumferential Edema Measurements - multi extremities 0 []  - 0 Nutritional Assessment / Counseling / Intervention []  - 0 Lower Extremity Assessment (monofilament, tuning fork, pulses) []  - 0 Peripheral Arterial Disease Assessment (using hand held doppler) ASSESSMENTS - Ostomy and/or Continence Assessment and Care []  - Incontinence Assessment and Management 0 []  - 0 Ostomy Care Assessment and Management (repouching, etc.) PROCESS - Coordination of Care X - Simple Patient / Family Education for ongoing care 1 15 []  - 0 Complex (extensive) Patient / Family Education for ongoing care []  - 0 Staff obtains Programmer, systems, Records, Test Results / Process Orders []  - 0 Staff telephones HHA, Nursing Homes / Clarify orders / etc []  - 0 Routine Transfer to another Facility (non-emergent condition) []  - 0 Routine Hospital Admission (non-emergent condition) []  - 0 New Admissions / Biomedical engineer / Ordering NPWT, Apligraf, etc. []  - 0 Emergency Hospital Admission (emergent condition) X- 1 10 Simple Discharge Coordination []  - 0 Complex (extensive) Discharge Coordination PROCESS - Special Needs []  - Pediatric / Minor Patient Management 0 []  - 0 Isolation Patient Management []  - 0 Hearing / Language / Visual special needs []  - 0 Assessment of  Community assistance (transportation, D/C planning, etc.) []  - 0 Additional assistance / Altered mentation []  - 0 Support Surface(s) Assessment (bed, cushion, seat, etc.) INTERVENTIONS - Wound Cleansing / Measurement [name],  [name] ([id number]) []  - 0 Simple Wound Cleansing - one wound []  - 0 Complex Wound Cleansing - multiple wounds []  - 0 Wound Imaging (photographs - any number of wounds) []  - 0 Wound Tracing (instead of photographs) []  - 0 Simple Wound Measurement - one wound []  - 0 Complex Wound Measurement - multiple wounds INTERVENTIONS - Wound Dressings []  - Small Wound Dressing one or multiple wounds 0 []  - 0 Medium Wound Dressing one or multiple wounds []  - 0 Large Wound Dressing one or multiple wounds []  - 0 Application of Medications - topical []  - 0 Application of Medications - injection INTERVENTIONS - Miscellaneous []  - External ear exam 0 []  - 0 Specimen Collection (cultures, biopsies, blood, body fluids, etc.) []  - 0 Specimen(s) / Culture(s) sent or taken to Lab for analysis []  - 0 Patient Transfer (multiple staff / Civil Service fast streamer / Similar devices) []  - 0 Simple Staple / Suture removal (25 or less) []  - 0 Complex Staple / Suture removal (26 or more) []  - 0 Hypo / Hyperglycemic Management (close monitor of Blood Glucose) []  - 0 Ankle / Brachial Index (ABI) - do not check if billed separately X- 1 5 Vital Signs Has the patient been seen at the hospital within the last three years: Yes Total Score: 55 Level Of Care: New/Established - Level 2 Electronic Signature(s) Signed: 09/05/2020 4:43:03 PM By: Georges Mouse, Minus Breeding RN Entered By: Georges Mouse, Minus Breeding on 09/05/2020 12:24:59 [name] ([id number]) -------------------------------------------------------------------------------- Encounter Discharge Information Details Patient Name: [name]. Date of Service: 09/05/2020 11:15 AM Medical Record Number: [id number] Patient Account  Number: [id number] Date of Birth/Sex: 08/03/1929 (87 y.o. F) Treating RN: Dolan Amen Primary Care Chella Chapdelaine: Ria Bush Other Clinician: Referring Cohen Boettner: Ria Bush Treating Nickcole Bralley/Extender: Skipper Cliche in Treatment: 5 Encounter Discharge Information Items Discharge Condition: Stable Ambulatory Status: Walker Discharge Destination: Home Transportation: Private Auto Accompanied By: daughter Schedule Follow-up Appointment: No Clinical Summary of Care: Electronic Signature(s) Signed: 09/05/2020 4:43:03 PM By: Georges Mouse, Minus Breeding RN Entered By: Georges Mouse, Minus Breeding on 09/05/2020 12:27:56 [name] ([id number]) -------------------------------------------------------------------------------- Lower Extremity Assessment Details Patient Name: [name] Date of Service: 09/05/2020 11:15 AM Medical Record Number: [id number] Patient Account Number: [id number] Date of Birth/Sex: 10/08/1929 (87 y.o. F) Treating RN: Carlene Coria Primary Care Stephenson Cichy: Ria Bush Other Clinician: Referring Shaily Librizzi: Ria Bush Treating Latrell Potempa/Extender: Jeri Cos Weeks in Treatment: 47 Edema Assessment Assessed: [Left: No] [Right: Yes] Edema: [Left: Ye] [Right: s] Vascular Assessment Pulses: Dorsalis Pedis Palpable: [Right:Yes] Electronic Signature(s) Signed: 09/05/2020 3:50:09 PM By: Carlene Coria RN Entered By: Carlene Coria on 09/05/2020 12:08:35 [name] ([id number]) -------------------------------------------------------------------------------- Multi Wound Chart Details Patient Name: [name]. Date of Service: 09/05/2020 11:15 AM Medical Record Number: [id number] Patient Account Number: [id number] Date of Birth/Sex: 05-04-1929 (87 y.o. F) Treating RN: Dolan Amen Primary Care Leeanne Butters: Ria Bush Other Clinician: Referring Etna Forquer: Ria Bush Treating Kayleen Alig/Extender: Skipper Cliche in Treatment:  47 Vital Signs Height(in): 62 Pulse(bpm): 72 Weight(lbs): 140 Blood Pressure(mmHg): 180/76 Body Mass Index(BMI): 26 Temperature(F): 98.3 Respiratory Rate(breaths/min): 18 Photos: Wound Location: Right Metatarsal head fifth Left, Distal, Anterior Forearm Right, Lateral, Anterior Elbow Wounding Event: Gradually Appeared Skin Tear/Laceration Skin Tear/Laceration Primary Etiology: Arterial Insufficiency Ulcer Trauma, Other Trauma, Other Comorbid History: Arrhythmia, Osteoarthritis Arrhythmia, Osteoarthritis Arrhythmia, Osteoarthritis Date Acquired: 04/09/2019 08/14/2020 08/14/2020 Weeks of Treatment: 47 3 3 Wound Status: Open Open Open Measurements L x W x D (cm) 0.1x0.1x0.1 1x1x0.1 0x0x0 Area (cm) : 0.008 0.785  0 Volume (cm) : 0.001 0.079 0 % Reduction in Area: 99.30% 16.00% 100.00% % Reduction in Volume: 99.10% 15.10% 100.00% Classification: Full Thickness Without Exposed Full Thickness Without Exposed Full Thickness Without Exposed Support Structures Support Structures Support Structures Exudate Amount: Small Medium Medium Exudate Type: Serosanguineous Serosanguineous Serosanguineous Exudate Color: red, brown red, brown red, brown Wound Margin: Distinct, outline attached N/A N/A Granulation Amount: Large (67-100%) Medium (34-66%) None Present (0%) Granulation Quality: Red, Pink Pink N/A Necrotic Amount: None Present (0%) Medium (34-66%) Large (67-100%) Necrotic Tissue: N/A Adherent Slough Eschar Exposed Structures: Fat Layer (Subcutaneous Tissue): Fat Layer (Subcutaneous Tissue): Fat Layer (Subcutaneous Tissue): Yes Yes Yes Fascia: No Fascia: No Fascia: No Tendon: No Tendon: No Tendon: No Muscle: No Muscle: No Muscle: No Joint: No Joint: No Joint: No Bone: No Bone: No Bone: No Epithelialization: Small (1-33%) None None Treatment Notes Electronic Signature(s) Signed: 09/05/2020 4:43:03 PM By: Georges Mouse, Minus Breeding RN Entered By: Georges Mouse, Minus Breeding on 09/05/2020  12:20:01 [name] ([id number]) -------------------------------------------------------------------------------- Multi-Disciplinary Care Plan Details Patient Name: [name]. Date of Service: 09/05/2020 11:15 AM Medical Record Number: [id number] Patient Account Number: [id number] Date of Birth/Sex: [birth year] (87 y.o. F) Treating RN: Dolan Amen Primary Care Dallas Scorsone: Ria Bush Other Clinician: Referring Cristan Scherzer: Ria Bush Treating Jacque Garrels/Extender: Skipper Cliche in Treatment: 62 Active Inactive Electronic Signature(s) Signed: 09/05/2020 4:43:03 PM By: Georges Mouse, Minus Breeding RN Entered By: Georges Mouse, Minus Breeding on 09/05/2020 12:19:39 [name] ([id number]) -------------------------------------------------------------------------------- Pain Assessment Details Patient Name: [name] Date of Service: 09/05/2020 11:15 AM Medical Record Number: [id number] Patient Account Number: [id number] Date of Birth/Sex: April 12, 1929 (87 y.o. F) Treating RN: Carlene Coria Primary Care Zeferino Mounts: Ria Bush Other Clinician: Referring Michaela Shankel: Ria Bush Treating Alistair Senft/Extender: Skipper Cliche in Treatment: 47 Active Problems Location of Pain Severity and Description of Pain Patient Has Paino No Site Locations Pain Management and Medication Current Pain Management: Electronic Signature(s) Signed: 09/05/2020 3:50:09 PM By: Carlene Coria RN Entered By: Carlene Coria on 09/05/2020 12:00:55 [name] ([id number]) -------------------------------------------------------------------------------- Patient/Caregiver Education Details Patient Name: [name] Date of Service: 09/05/2020 11:15 AM Medical Record Number: [id number] Patient Account Number: [id number] Date of Birth/Gender: 04/02/1930 (87 y.o. F) Treating RN: Dolan Amen Primary Care Physician: Ria Bush Other Clinician: Referring Physician: Ria Bush Treating Physician/Extender: Skipper Cliche in Treatment: 19 Education Assessment Education Provided To: Patient Education Topics Provided Notes discharge instructions Electronic Signature(s) Signed: 09/05/2020 4:43:03 PM By: Georges Mouse, Minus Breeding RN Entered By: Georges Mouse, Minus Breeding on 09/05/2020 12:27:33 [name] ([id number]) -------------------------------------------------------------------------------- Wound Assessment Details Patient Name: [name] Date of Service: 09/05/2020 11:15 AM Medical Record Number: [id number] Patient Account Number: [id number] Date of Birth/Sex: Aug 28, 1929 (87 y.o. F) Treating RN: Carlene Coria Primary Care Ira Busbin: Ria Bush Other Clinician: Referring Joshuwa Vecchio: Ria Bush Treating Stacye Noori/Extender: Skipper Cliche in Treatment: 47 Wound Status Wound Number: 3 Primary Etiology: Arterial Insufficiency Ulcer Wound Location: Right Metatarsal head fifth Wound Status: Open Wounding Event: Gradually Appeared Comorbid History: Arrhythmia, Osteoarthritis Date Acquired: 04/09/2019 Weeks Of Treatment: 47 Clustered Wound: No Photos Wound Measurements Length: (cm) 0 Width: (cm) 0 Depth: (cm) 0 Area: (cm) 0 Volume: (cm) 0 % Reduction in Area: 100% % Reduction in Volume: 100% Epithelialization: Large (67-100%) Tunneling: No Undermining: No Wound Description Classification: Full Thickness Without Exposed Support Structure Wound Margin: Distinct, outline attached Exudate Amount: None Present s Foul Odor After Cleansing: No Slough/Fibrino No Wound Bed Granulation Amount: None Present (0%) Exposed Structure Necrotic Amount:  None Present (0%) Fascia Exposed: No Fat Layer (Subcutaneous Tissue) Exposed: No Tendon Exposed: No Muscle Exposed: No Joint Exposed: No Bone Exposed: No Electronic Signature(s) Signed: 09/05/2020 3:50:09 PM By: Carlene Coria RN Signed: 09/05/2020 4:43:03 PM By: Georges Mouse,  Minus Breeding RN Entered By: Georges Mouse, Minus Breeding on 09/05/2020 12:22:11 [name], [name] ([id number]) -------------------------------------------------------------------------------- Wound Assessment Details Patient Name: [name]. Date of Service: 09/05/2020 11:15 AM Medical Record Number: [id number] Patient Account Number: [id number] Date of Birth/Sex: [birth year] (87 y.o. F) Treating RN: Dolan Amen Primary Care Kyle Stansell: Ria Bush Other Clinician: Referring Aroush Chasse: Ria Bush Treating Chenise Mulvihill/Extender: Skipper Cliche in Treatment: 47 Wound Status Wound Number: 5 Primary Etiology: Trauma, Other Wound Location: Left, Distal, Anterior Forearm Wound Status: Healed - Epithelialized Wounding Event: Skin Tear/Laceration Comorbid History: Arrhythmia, Osteoarthritis Date Acquired: 08/14/2020 Weeks Of Treatment: 3 Clustered Wound: No Photos Wound Measurements Length: (cm) 0 Width: (cm) 0 Depth: (cm) 0 Area: (cm) 0 Volume: (cm) 0 % Reduction in Area: 100% % Reduction in Volume: 100% Epithelialization: Large (67-100%) Tunneling: No Undermining: No Wound Description Classification: Full Thickness Without Exposed Support Structure Exudate Amount: None Present s Foul Odor After Cleansing: No Slough/Fibrino No Wound Bed Granulation Amount: None Present (0%) Exposed Structure Necrotic Amount: None Present (0%) Fascia Exposed: No Fat Layer (Subcutaneous Tissue) Exposed: No Tendon Exposed: No Muscle Exposed: No Joint Exposed: No Bone Exposed: No Electronic Signature(s) Signed: 09/05/2020 4:43:03 PM By: Georges Mouse, Minus Breeding RN Entered By: Georges Mouse, Minus Breeding on 09/05/2020 12:23:14 [name] ([id number]) -------------------------------------------------------------------------------- Wound Assessment Details Patient Name: [name]. Date of Service: 09/05/2020 11:15 AM Medical Record Number: [id number] Patient Account Number:  [id number] Date of Birth/Sex: [birth year] (87 y.o. F) Treating RN: Carlene Coria Primary Care Emalea Mix: Ria Bush Other Clinician: Referring Zion Lint: Ria Bush Treating Wateen Varon/Extender: Skipper Cliche in Treatment: 47 Wound Status Wound Number: 6 Primary Etiology: Trauma, Other Wound Location: Right, Lateral, Anterior Elbow Wound Status: Healed - Epithelialized Wounding Event: Skin Tear/Laceration Comorbid History: Arrhythmia, Osteoarthritis Date Acquired: 08/14/2020 Weeks Of Treatment: 3 Clustered Wound: No Photos Wound Measurements Length: (cm) 0 Width: (cm) 0 Depth: (cm) 0 Area: (cm) 0 Volume: (cm) 0 % Reduction in Area: 100% % Reduction in Volume: 100% Epithelialization: Large (67-100%) Tunneling: No Undermining: No Wound Description Classification: Full Thickness Without Exposed Support Structure Exudate Amount: None Present s Foul Odor After Cleansing: No Slough/Fibrino No Wound Bed Granulation Amount: None Present (0%) Exposed Structure Necrotic Amount: None Present (0%) Fascia Exposed: No Fat Layer (Subcutaneous Tissue) Exposed: No Tendon Exposed: No Muscle Exposed: No Joint Exposed: No Bone Exposed: No Electronic Signature(s) Signed: 09/05/2020 3:50:09 PM By: Carlene Coria RN Signed: 09/05/2020 4:43:03 PM By: Georges Mouse, Minus Breeding RN Entered By: Georges Mouse, Minus Breeding on 09/05/2020 12:23:31 [name] ([id number]) -------------------------------------------------------------------------------- Vitals Details Patient Name: [name]. Date of Service: 09/05/2020 11:15 AM Medical Record Number: [id number] Patient Account Number: [id number] Date of Birth/Sex: 1929/11/09 (87 y.o. F) Treating RN: Carlene Coria Primary Care Aniella Wandrey: Ria Bush Other Clinician: Referring Creasie Lacosse: Ria Bush Treating Desman Polak/Extender: Skipper Cliche in Treatment: 47 Vital Signs Time Taken: 12:00 Temperature (F): 98.3 Height  (in): 62 Pulse (bpm): 72 Weight (lbs): 140 Respiratory Rate (breaths/min): 18 Body Mass Index (BMI): 25.6 Blood Pressure (mmHg): 180/76 Reference Range: 80 - 120 mg / dl Electronic Signature(s) Signed: 09/05/2020 3:50:09 PM By: Carlene Coria RN Entered By: Carlene Coria on 09/05/2020 12:00:48

## 2020-09-05 NOTE — Progress Notes (Addendum)
[name] ([id number]) Visit Report for 09/05/2020 Chief Complaint Document Details Patient Name: [name], [name]. Date of Service: 09/05/2020 11:15 AM Medical Record Number: [id number] Patient Account Number: [id number] Date of Birth/Sex: 1929/08/14 (87 y.o. F) Treating RN: Dolan Amen Primary Care Provider: Ria Bush Other Clinician: Referring Provider: Ria Bush Treating Provider/Extender: Skipper Cliche in Treatment: 10 Information Obtained from: Patient Chief Complaint Right Lateral Foot Ulcer and Right Elbow and Left Forearm Skin Tears Electronic Signature(s) Signed: 09/05/2020 12:08:17 PM By: Worthy Keeler PA-C Entered By: Worthy Keeler on 09/05/2020 12:08:17 [name], [name] ([id number]) -------------------------------------------------------------------------------- HPI Details Patient Name: [name] Date of Service: 09/05/2020 11:15 AM Medical Record Number: [id number] Patient Account Number: [id number] Date of Birth/Sex: 1929/09/14 (87 y.o. F) Treating RN: Dolan Amen Primary Care Provider: Ria Bush Other Clinician: Referring Provider: Ria Bush Treating Provider/Extender: Skipper Cliche in Treatment: 12 History of Present Illness HPI Description: 10/12/2019 upon evaluation today patient appears to be doing somewhat poorly in regard to her wound at this point. She has a ulcer over the right fifth metatarsal region. This has been managed by podiatry up to this point. Unfortunately the patient just does not seem to be making the progress that they were hoping for an subsequently the patient was referred to [place] by [facility] Dr. Posey Pronto. With that being said I did note that the patient does have peripheral vascular disease which has been managed by Dr. Lazaro Arms her most recent angiogram with intervention was on 08/02/2019. The ABIs following were on the right 1.30 with a TBI of 0.61 normal left 1.31 with a TBI  of 0.58. Obviously things seem to be doing somewhat better at this point hopefully enough to allow her to heal. She is on anticoagulant therapy due to atrial fibrillation long-term. With that being said the patient is unfortunately having quite a bit of discomfort at this time secondary to the wound. She also has erythema which is very likely secondary to infection. I am going obtain a culture of the area today once debridement is complete. Apparently the patient also had a x-ray that was performed by Dr. Posey Pronto on 09/21/2019 and this showed that the patient had no obvious signs on x-ray of cortical irregularity and no osteomyelitis obviously noted. With that being said as I discussed with the patient's daughter as well this does not indicate that the patient absolutely has no osteomyelitis as x-rays are only at best 50% helpful in identifying this complication. Nonetheless I do believe that we need to have the patient continue to have this further evaluated specifically I am to recommend that we check into an MRI which I think would be ideal to evaluate whether or not there is osteomyelitis present. 7/16; this is a patient with an ulcer over the right fifth metatarsal head. She follows with Dr. Lucky Cowboy [facility]. She is due to have an MRI of the foot on 7/26. Using Santyl to the wound 11/04/2019 on evaluation today patient appears to be doing okay in regard to her wound at this time. Unfortunately since I last saw her which was actually her second visit here in the clinic she has moved into a new apartment and then subsequently had a fall. She ended up in the hospital she did fracture her left arm and subsequently has also been dealing with a lot of dizziness she has been at [employer]. She tells me that getting in the change the dressing has  been somewhat difficult as far as daily dressing changes are concerned with the Santyl. Nonetheless when they do change it that is what is been  used and her daughter-in-law has also been coming in to help out as well with the dressing changes to try to make sure it gets done daily. Fortunately there is no signs of active infection at this time. No fever chills noted. I do believe the Santyl has been beneficial and is likely still the best way to go at this point for the patient. I did review the patient's MRI which showed some potential for reactive osteitis but did not show any signs of osteomyelitis overtly. That cannot be excluded but again right now it does not appear that is very likely present as well. This is good news. I also did review her culture she has been on the Bactrim she tells me she has taken this and again that was appropriate based on the results of her culture. 11/19/2019 upon evaluation today patient appears to be doing decently well in regard to her wound. Fortunately there is no signs of active infection at this time wound is not significantly larger in fact is roughly about the same at this point. With that being said there is not as much erythema as previously noted I think the infection seems to be under much better control which is great news. Unfortunately she has fallen since I last saw her and broken her left arm she has an appointment with them following the appointment with me. 8/25; patient was worked in acutely today at the request of home health and was concerned about her right heel, generalized erythema in her feet bilaterally. According to her daughter things have been getting worse over the last for 5 days but not precisely from a wound care point of view. Her wound is on the lateral part of the right fifth metatarsal head looks about the same as I remember this. She has developed dryness and flaking of the skin on her plantar feet. As well a very tender area localized over the insertion point of the Achilles tendon. She has restless leg syndrome. Does not specifically offload the area at night. She wears  socks at home and does not really give me a pressure on the right heel issue. I reviewed her vascular status. She underwent her last revascularization on 08/02/2019 by Dr. Lucky Cowboy. This included an angioplasty of the right peroneal artery as well as the right posterior tibial artery. Her last noninvasive arterial study was on 11/23/2019. On the right her ABI was 1.08 TBI of 0.48. On the left ABI at 1.21 with a TBI of 0.54. Family relates that Dr. Lucky Cowboy felt that he had done the best he could and this. We have been using Santyl to the one wound she has on the right lateral fifth metatarsal head. She does not have a new wound today which is the concern that letter coming into the clinic 12/09/2019 on evaluation today patient appears to be doing decently well in regard to her foot ulcer at this point. She does note that with the compression wrap that the home health nurse put on after contacting our clinic that she actually did much better and felt much better. Her heel is also improved. Fortunately there is no signs of active infection at this time. No fevers, chills, nausea, vomiting, or diarrhea. 12/24/2019 upon evaluation today patient appears to be doing somewhat poorly still in regard to her foot. Unfortunately this is just  not showing the signs of improvement that I would like to see it is a little bit cleaner but also appears to be erythematous and I believe there is infection noted at this point. We will get a need to address this in my opinion. I did actually recommend that we switch back to the Iodoflex as well apparently the home health nurse had switch this away that not really what we want to do at this point in my opinion. They were using silver alginate I think most recently. 01/06/2020 upon evaluation today patient actually appears to be doing much better in regard to her wound. We have switched back to the silver alginate after her home health nurse contacted me and honestly I feel like she is  doing quite well in that regard. She does have a lot of drainage and therefore I think that is probably a better option for. With that being said she is tolerating the compression wrap without complication that also seems to be helping. 01/28/2020 on evaluation today patient appears to be doing well with regard to her foot ulcer. I do feel like she is showing signs of improvement which is great news. There is no signs of active infection at this time which is also good news. No fevers, chills, nausea, vomiting, or diarrhea. Unfortunately she did have a fall and sustained an injury to the left upper arm. She has a skin tear here this appears to be doing okay although [name], [name]. ([id number]) the dressing was stuck to this this was an alginate dressing. I really think she probably needs something like Xeroform to try to prevent anything from sticking to this region. Other than that she seems to be doing quite well. 02/11/2020 on evaluation today patient's arm skin tear appears to be completely healed which is great. Unfortunately she is still having issues at this time with her foot although this seems to be showing signs of improvement it still very slow. There is no signs of active infection at this time. 03/06/2020 upon evaluation today patient appears to be doing decently well in regard to her foot ulcer. There is some minimal slough noted on the surface of the wound building up at this point. Otherwise I really feel like she is doing quite nicely with quite a bit of new granulation and epithelial growth. 03/23/2020 upon evaluation today patient appears to be doing well with regards to her wound. She has been tolerating the dressing changes without complication. Fortunately there is no evidence of active infection and overall feel like her foot ulcer is doing excellent at this point. 04/13/2020 on evaluation today patient appears to be doing well with regard to her foot ulcer. She has been  tolerating the dressing changes without complication. Fortunately there is no signs of active infection at this time. Overall I feel like the wound is getting close to complete closure and this appears to be doing quite well in my opinion. 04/27/2020 on evaluation today patient appears to be doing excellent in regard to her wound. She has been tolerating the dressing changes without complication. Fortunately there is no signs of active infection and overall very pleased with where things stand today. In fact I feel like the wound is very close to complete resolution. 05/18/2020 upon evaluation today patient appears to be doing well with regard to her foot ulcer. There is no evidence of active infection at this time which is great news and overall very pleased with where things stand. No fevers,  chills, nausea, vomiting, or diarrhea. I do feel like she is making great progress and I feel like that the wound is very tiny but again this last portion getting close has been somewhat difficult. 07/05/2020 upon evaluation today patient appears to be doing a little poorly in regard to her foot ulcer. Fortunately there is no signs of active infection at this time systemically though locally I feel it may be some cellulitis it looks like this wound may have prematurely sealed up subsequently causing a blister that had to be removed. The good news is that I do not think it is a very bad infection but I believe she may be experiencing cellulitis. 06/16/2020 upon evaluation today patient appears to be doing well with regard to her wound. It seems like it showed signs of improvement compared to last week where she had more infection noted. With that being said I do believe that based on what I am seeing the patient seems to be making excellent progress although I think still this is a small area that is going require some debridement to clear away some of the callus and drainage which is trapping fluid behind it based  on what I see. 06/23/2020 upon evaluation today patient appears to be doing well with regard to her leg ulcer. She has been tolerating the dressing changes without application. And currently I do believe the alginate is doing a good job keeping it dry it still tends to cover over and we do not want her trapping any fluid therefore I do remove that outer callus/drainage covering each time she comes in. Overall I think this is doing a great job. 06/30/2020 patient appears to be doing okay in regard to pain in regard to her wound. Unfortunately she continues to develop a callus buildup over the area. This is unfortunate as to be honest it is continuing to cause problems with her basically being able to heal as she needs to really granulate in from the bottom out on that is not happening. I think we need to find something that works better to keep this area open. 07/10/2020 upon evaluation today patient unfortunately appears to be having increased pain yet again and is not doing nearly as well as she was previous. I do believe this is indication of infection. The patient is going to require a culture today it appears. 07/18/2020 upon evaluation today patient appears to be doing well with regard to her wounds. She has been tolerating the dressing changes without complication. In fact this appears to be doing significantly better compared to last week. I did review her however her pathology which showed acute osteomyelitis. Also did review the culture which showed Staph aureus. She is on Bactrim that does seem to be helping. I am unlikely going to continue that for a total of 2 months. I think they would prefer that over going to infectious disease. I am definitely okay with that as far as that is concerned. I am going to likely go ahead and refill the Bactrim next week for another 2 weeks supply in order to get the patient through for this month and then will have to do 1 more month for a full 8 weeks of  treatment. Several of the times that I sent patients to infectious disease they just kept the patient on oral antibiotic therapy as opposed to switching IV anyway and she seems to be doing very well after just 1 week. 07/25/2020 upon evaluation today patient appears to be  doing well with regard to her wound. She still has an opening and there is still some depth there as well. With that being said I do think that the patient does have osteomyelitis and very well could be a candidate for hyperbaric oxygen therapy. However I [name] see how she does over the next 2 weeks with the oral antibiotics. Obviously if she is not close by that time I think proceeding with the HBO therapy would be appropriate. I did discuss this with the patient and her daughter today. I think that this may be something to strongly consider going forward if things do not improve over again the next 2 weeks. 08/08/2020 upon evaluation today patient appears to be doing decently well in regard to her wound. The good news is she does not appear to have any signs of infection worsening which is good she still has a small hole open here in the [facility] I think is still appropriate to try to let this granulate in from the bottom out. With that being said this is getting tiny but nonetheless is not completely resolving. Reason I did discuss the possibility of hyperbaric oxygen therapy with the patient and her daughter today again. 08/15/2020 upon evaluation today patient appears to be doing well with regard to her foot ulcer. In fact I am very pleased with where things stand in that regard. I am not even certain she is going to have to have the hyperbarics although this still something to consider depending on how things go. Overall the biggest issue today is she actually had a fall over top of her walker causing several contusions over the upper and lower extremities unfortunately she does have a new skin tear 1 in the right elbow  location and one on the left forearm. 08/21/2020 upon evaluation today patient actually appears to be doing decently well in regard to her wounds. The skin tears actually are healing quite nicely especially the elbow and very pleased in that regard. With that being said she is not having any significant pain here. In regard to her foot ulcer this also seems to be doing well I do not see any signs of active infection at this time that resolved. No fever chills noted 09/05/2020 upon evaluation today patient appears to be doing extremely well in regard to her wound. She is tolerating the dressing changes which is excellent. I do not see any sign of infection which is also excellent. With that being said I do think that in fact she appears to be completely healed I do not see anything open and specifically in regard to the foot she is still taking the antibiotics I wanted to do this to completion but I think for the most part I do not see anything open that we need to continue to follow and monitor. [name], [name] ([id number]) Electronic Signature(s) Signed: 09/05/2020 5:32:00 PM By: Worthy Keeler PA-C Entered By: Worthy Keeler on 09/05/2020 17:32:00 [name], [name] ([id number]) -------------------------------------------------------------------------------- Physical Exam Details Patient Name: [name], [name]. Date of Service: 09/05/2020 11:15 AM Medical Record Number: [id number] Patient Account Number: [id number] Date of Birth/Sex: 29-Mar-1930 (87 y.o. F) Treating RN: Dolan Amen Primary Care Provider: Ria Bush Other Clinician: Referring Provider: Ria Bush Treating Provider/Extender: Skipper Cliche in Treatment: 11 Constitutional Well-nourished and well-hydrated in no acute distress. Respiratory normal breathing without difficulty. Psychiatric this patient is able to make decisions and demonstrates good insight into disease process. Alert and Oriented x 3. pleasant  and cooperative. Notes Upon inspection patient's wounds again all appear to be completely closed which is great news the patient and her daughter were extremely pleased to hear this. Electronic Signature(s) Signed: 09/05/2020 5:32:14 PM By: Worthy Keeler PA-C Entered By: Worthy Keeler on 09/05/2020 17:32:13 [name], [name] ([id number]) -------------------------------------------------------------------------------- Physician Orders Details Patient Name: [name] Date of Service: 09/05/2020 11:15 AM Medical Record Number: [id number] Patient Account Number: [id number] Date of Birth/Sex: February 01, 1930 (87 y.o. F) Treating RN: Dolan Amen Primary Care Provider: Ria Bush Other Clinician: Referring Provider: Ria Bush Treating Provider/Extender: Skipper Cliche in Treatment: 74 Verbal / Phone Orders: No Diagnosis Coding ICD-10 Coding Code Description I73.89 Other specified peripheral vascular diseases L97.512 Non-pressure chronic ulcer of other part of right foot with fat layer exposed M86.671 Other chronic osteomyelitis, right ankle and foot S51.001A Unspecified open wound of right elbow, initial encounter S51.802A Unspecified open wound of left forearm, initial encounter Z79.01 Long term (current) use of anticoagulants I48.0 Paroxysmal atrial fibrillation B35.3 Tinea pedis Discharge From [facility] o Discharge from [place] [facility] Complete Electronic Signature(s) Signed: 09/05/2020 4:43:03 PM By: Charlett Nose RN Signed: 09/05/2020 5:49:10 PM By: Worthy Keeler PA-C Entered By: Georges Mouse, Minus Breeding on 09/05/2020 12:24:12 [name] ([id number]) -------------------------------------------------------------------------------- Problem List Details Patient Name: [name]. Date of Service: 09/05/2020 11:15 AM Medical Record Number: [id number] Patient Account Number: [id number] Date of Birth/Sex: Apr 25, 1929 (87 y.o.  F) Treating RN: Dolan Amen Primary Care Provider: Ria Bush Other Clinician: Referring Provider: Ria Bush Treating Provider/Extender: Skipper Cliche in Treatment: 47 Active Problems ICD-10 Encounter Code Description Active Date MDM Diagnosis I73.89 Other specified peripheral vascular diseases 10/12/2019 No Yes L97.512 Non-pressure chronic ulcer of other part of right foot with fat layer 10/12/2019 No Yes exposed M86.671 Other chronic osteomyelitis, right ankle and foot 07/25/2020 No Yes S51.001A Unspecified open wound of right elbow, initial encounter 08/15/2020 No Yes S51.802A Unspecified open wound of left forearm, initial encounter 08/15/2020 No Yes Z79.01 Long term (current) use of anticoagulants 10/12/2019 No Yes I48.0 Paroxysmal atrial fibrillation 10/12/2019 No Yes B35.3 Tinea pedis 12/01/2019 No Yes Inactive Problems Resolved Problems ICD-10 Code Description Active Date Resolved Date S41.102A Unspecified open wound of left upper arm, initial encounter 01/28/2020 01/28/2020 Electronic Signature(s) Signed: 09/05/2020 12:08:11 PM By: Worthy Keeler PA-C Entered By: Worthy Keeler on 09/05/2020 12:08:11 [name] ([id number]) -------------------------------------------------------------------------------- Progress Note Details Patient Name: [name]. Date of Service: 09/05/2020 11:15 AM Medical Record Number: [id number] Patient Account Number: [id number] Date of Birth/Sex: 1929-12-22 (87 y.o. F) Treating RN: Dolan Amen Primary Care Provider: Ria Bush Other Clinician: Referring Provider: Ria Bush Treating Provider/Extender: Skipper Cliche in Treatment: 56 Subjective Chief Complaint Information obtained from Patient Right Lateral Foot Ulcer and Right Elbow and Left Forearm Skin Tears History of Present Illness (HPI) 10/12/2019 upon evaluation today patient appears to be doing somewhat poorly in regard to her wound at this  point. She has a ulcer over the right fifth metatarsal region. This has been managed by podiatry up to this point. Unfortunately the patient just does not seem to be making the progress that they were hoping for an subsequently the patient was referred to [place] by [facility] Dr. Posey Pronto. With that being said I did note that the patient does have peripheral vascular disease which has been managed by Dr. Lazaro Arms her most recent angiogram with intervention was on 08/02/2019. The ABIs following were on  the right 1.30 with a TBI of 0.61 normal left 1.31 with a TBI of 0.58. Obviously things seem to be doing somewhat better at this point hopefully enough to allow her to heal. She is on anticoagulant therapy due to atrial fibrillation long-term. With that being said the patient is unfortunately having quite a bit of discomfort at this time secondary to the wound. She also has erythema which is very likely secondary to infection. I am going obtain a culture of the area today once debridement is complete. Apparently the patient also had a x-ray that was performed by Dr. Posey Pronto on 09/21/2019 and this showed that the patient had no obvious signs on x-ray of cortical irregularity and no osteomyelitis obviously noted. With that being said as I discussed with the patient's daughter as well this does not indicate that the patient absolutely has no osteomyelitis as x-rays are only at best 50% helpful in identifying this complication. Nonetheless I do believe that we need to have the patient continue to have this further evaluated specifically I am to recommend that we check into an MRI which I think would be ideal to evaluate whether or not there is osteomyelitis present. 7/16; this is a patient with an ulcer over the right fifth metatarsal head. She follows with Dr. Lucky Cowboy at [facility] vascular. She is due to have an MRI of the foot on 7/26. Using Santyl to the wound 11/04/2019 on evaluation today patient appears to  be doing okay in regard to her wound at this time. Unfortunately since I last saw her which was actually her second visit here in the clinic she has moved into a new apartment and then subsequently had a fall. She ended up in the hospital she did fracture her left arm and subsequently has also been dealing with a lot of dizziness she has been at [employer]. She tells me that getting in the change the dressing has been somewhat difficult as far as daily dressing changes are concerned with the Santyl. Nonetheless when they do change it that is what is been used and her daughter-in-law has also been coming in to help out as well with the dressing changes to try to make sure it gets done daily. Fortunately there is no signs of active infection at this time. No fever chills noted. I do believe the Santyl has been beneficial and is likely still the best way to go at this point for the patient. I did review the patient's MRI which showed some potential for reactive osteitis but did not show any signs of osteomyelitis overtly. That cannot be excluded but again right now it does not appear that is very likely present as well. This is good news. I also did review her culture she has been on the Bactrim she tells me she has taken this and again that was appropriate based on the results of her culture. 11/19/2019 upon evaluation today patient appears to be doing decently well in regard to her wound. Fortunately there is no signs of active infection at this time wound is not significantly larger in fact is roughly about the same at this point. With that being said there is not as much erythema as previously noted I think the infection seems to be under much better control which is great news. Unfortunately she has fallen since I last saw her and broken her left arm she has an appointment with them following the appointment with me. 8/25; patient was worked in acutely today  at the request of home health and  was concerned about her right heel, generalized erythema in her feet bilaterally. According to her daughter things have been getting worse over the last for 5 days but not precisely from a wound care point of view. Her wound is on the lateral part of the right fifth metatarsal head looks about the same as I remember this. She has developed dryness and flaking of the skin on her plantar feet. As well a very tender area localized over the insertion point of the Achilles tendon. She has restless leg syndrome. Does not specifically offload the area at night. She wears socks at home and does not really give me a pressure on the right heel issue. I reviewed her vascular status. She underwent her last revascularization on 08/02/2019 by Dr. Lucky Cowboy. This included an angioplasty of the right peroneal artery as well as the right posterior tibial artery. Her last noninvasive arterial study was on 11/23/2019. On the right her ABI was 1.08 TBI of 0.48. On the left ABI at 1.21 with a TBI of 0.54. Family relates that Dr. Lucky Cowboy felt that he had done the best he could and this. We have been using Santyl to the one wound she has on the right lateral fifth metatarsal head. She does not have a new wound today which is the concern that letter coming into the clinic 12/09/2019 on evaluation today patient appears to be doing decently well in regard to her foot ulcer at this point. She does note that with the compression wrap that the home health nurse put on after contacting our clinic that she actually did much better and felt much better. Her heel is also improved. Fortunately there is no signs of active infection at this time. No fevers, chills, nausea, vomiting, or diarrhea. 12/24/2019 upon evaluation today patient appears to be doing somewhat poorly still in regard to her foot. Unfortunately this is just not showing the signs of improvement that I would like to see it is a little bit cleaner but also appears to be erythematous and  I believe there is infection noted at this point. We will get a need to address this in my opinion. I did actually recommend that we switch back to the Iodoflex as well apparently the home health nurse had switch this away that not really what we want to do at this point in my opinion. They were using silver alginate I think most recently. 01/06/2020 upon evaluation today patient actually appears to be doing much better in regard to her wound. We have switched back to the silver alginate after her home health nurse contacted me and honestly I feel like she is doing quite well in that regard. She does have a lot of drainage and therefore I think that is probably a better option for. With that being said she is tolerating the compression wrap without complication that [name], [name]. ([id number]) also seems to be helping. 01/28/2020 on evaluation today patient appears to be doing well with regard to her foot ulcer. I do feel like she is showing signs of improvement which is great news. There is no signs of active infection at this time which is also good news. No fevers, chills, nausea, vomiting, or diarrhea. Unfortunately she did have a fall and sustained an injury to the left upper arm. She has a skin tear here this appears to be doing okay although the dressing was stuck to this this was an alginate dressing. I really  think she probably needs something like Xeroform to try to prevent anything from sticking to this region. Other than that she seems to be doing quite well. 02/11/2020 on evaluation today patient's arm skin tear appears to be completely healed which is great. Unfortunately she is still having issues at this time with her foot although this seems to be showing signs of improvement it still very slow. There is no signs of active infection at this time. 03/06/2020 upon evaluation today patient appears to be doing decently well in regard to her foot ulcer. There is some minimal slough noted  on the surface of the wound building up at this point. Otherwise I really feel like she is doing quite nicely with quite a bit of new granulation and epithelial growth. 03/23/2020 upon evaluation today patient appears to be doing well with regards to her wound. She has been tolerating the dressing changes without complication. Fortunately there is no evidence of active infection and overall feel like her foot ulcer is doing excellent at this point. 04/13/2020 on evaluation today patient appears to be doing well with regard to her foot ulcer. She has been tolerating the dressing changes without complication. Fortunately there is no signs of active infection at this time. Overall I feel like the wound is getting close to complete closure and this appears to be doing quite well in my opinion. 04/27/2020 on evaluation today patient appears to be doing excellent in regard to her wound. She has been tolerating the dressing changes without complication. Fortunately there is no signs of active infection and overall very pleased with where things stand today. In fact I feel like the wound is very close to complete resolution. 05/18/2020 upon evaluation today patient appears to be doing well with regard to her foot ulcer. There is no evidence of active infection at this time which is great news and overall very pleased with where things stand. No fevers, chills, nausea, vomiting, or diarrhea. I do feel like she is making great progress and I feel like that the wound is very tiny but again this last portion getting close has been somewhat difficult. 07/05/2020 upon evaluation today patient appears to be doing a little poorly in regard to her foot ulcer. Fortunately there is no signs of active infection at this time systemically though locally I feel it may be some cellulitis it looks like this wound may have prematurely sealed up subsequently causing a blister that had to be removed. The good news is that I do not  think it is a very bad infection but I believe she may be experiencing cellulitis. 06/16/2020 upon evaluation today patient appears to be doing well with regard to her wound. It seems like it showed signs of improvement compared to last week where she had more infection noted. With that being said I do believe that based on what I am seeing the patient seems to be making excellent progress although I think still this is a small area that is going require some debridement to clear away some of the callus and drainage which is trapping fluid behind it based on what I see. 06/23/2020 upon evaluation today patient appears to be doing well with regard to her leg ulcer. She has been tolerating the dressing changes without application. And currently I do believe the alginate is doing a good job keeping it dry it still tends to cover over and we do not want her trapping any fluid therefore I do remove that outer callus/drainage  covering each time she comes in. Overall I think this is doing a great job. 06/30/2020 patient appears to be doing okay in regard to pain in regard to her wound. Unfortunately she continues to develop a callus buildup over the area. This is unfortunate as to be honest it is continuing to cause problems with her basically being able to heal as she needs to really granulate in from the bottom out on that is not happening. I think we need to find something that works better to keep this area open. 07/10/2020 upon evaluation today patient unfortunately appears to be having increased pain yet again and is not doing nearly as well as she was previous. I do believe this is indication of infection. The patient is going to require a culture today it appears. 07/18/2020 upon evaluation today patient appears to be doing well with regard to her wounds. She has been tolerating the dressing changes without complication. In fact this appears to be doing significantly better compared to last week. I did  review her however her pathology which showed acute osteomyelitis. Also did review the culture which showed Staph aureus. She is on Bactrim that does seem to be helping. I am unlikely going to continue that for a total of 2 months. I think they would prefer that over going to infectious disease. I am definitely okay with that as far as that is concerned. I am going to likely go ahead and refill the Bactrim next week for another 2 weeks supply in order to get the patient through for this month and then will have to do 1 more month for a full 8 weeks of treatment. Several of the times that I sent patients to infectious disease they just kept the patient on oral antibiotic therapy as opposed to switching IV anyway and she seems to be doing very well after just 1 week. 07/25/2020 upon evaluation today patient appears to be doing well with regard to her wound. She still has an opening and there is still some depth there as well. With that being said I do think that the patient does have osteomyelitis and very well could be a candidate for hyperbaric oxygen therapy. However I [name] see how she does over the next 2 weeks with the oral antibiotics. Obviously if she is not close by that time I think proceeding with the HBO therapy would be appropriate. I did discuss this with the patient and her daughter today. I think that this may be something to strongly consider going forward if things do not improve over again the next 2 weeks. 08/08/2020 upon evaluation today patient appears to be doing decently well in regard to her wound. The good news is she does not appear to have any signs of infection worsening which is good she still has a small hole open here in the [facility] I think is still appropriate to try to let this granulate in from the bottom out. With that being said this is getting tiny but nonetheless is not completely resolving. Reason I did discuss the possibility of hyperbaric oxygen therapy with  the patient and her daughter today again. 08/15/2020 upon evaluation today patient appears to be doing well with regard to her foot ulcer. In fact I am very pleased with where things stand in that regard. I am not even certain she is going to have to have the hyperbarics although this still something to consider depending on how things go. Overall the biggest issue today is  she actually had a fall over top of her walker causing several contusions over the upper and lower extremities unfortunately she does have a new skin tear 1 in the right elbow location and one on the left forearm. 08/21/2020 upon evaluation today patient actually appears to be doing decently well in regard to her wounds. The skin tears actually are healing quite nicely especially the elbow and very pleased in that regard. With that being said she is not having any significant pain here. In regard to her foot ulcer this also seems to be doing well I do not see any signs of active infection at this time that resolved. No fever chills noted [name], [name] ([id number]) 09/05/2020 upon evaluation today patient appears to be doing extremely well in regard to her wound. She is tolerating the dressing changes which is excellent. I do not see any sign of infection which is also excellent. With that being said I do think that in fact she appears to be completely healed I do not see anything open and specifically in regard to the foot she is still taking the antibiotics I wanted to do this to completion but I think for the most part I do not see anything open that we need to continue to follow and monitor. Objective Constitutional Well-nourished and well-hydrated in no acute distress. Vitals Time Taken: 12:00 PM, Height: 62 in, Weight: 140 lbs, BMI: 25.6, Temperature: 98.3 F, Pulse: 72 bpm, Respiratory Rate: 18 breaths/min, Blood Pressure: 180/76 mmHg. Respiratory normal breathing without difficulty. Psychiatric this patient is able to  make decisions and demonstrates good insight into disease process. Alert and Oriented x 3. pleasant and cooperative. General Notes: Upon inspection patient's wounds again all appear to be completely closed which is great news the patient and her daughter were extremely pleased to hear this. Integumentary (Hair, Skin) Wound #3 status is Open. Original cause of wound was Gradually Appeared. The date acquired was: 04/09/2019. The wound has been in treatment 47 weeks. The wound is located on the Right Metatarsal head fifth. The wound measures 0cm length x 0cm width x 0cm depth; 0cm^2 area and 0cm^3 volume. There is no tunneling or undermining noted. There is a none present amount of drainage noted. The wound margin is distinct with the outline attached to the wound base. There is no granulation within the wound bed. There is no necrotic tissue within the wound bed. Wound #5 status is Healed - Epithelialized. Original cause of wound was Skin Tear/Laceration. The date acquired was: 08/14/2020. The wound has been in treatment 3 weeks. The wound is located on the Left,Distal,Anterior Forearm. The wound measures 0cm length x 0cm width x 0cm depth; 0cm^2 area and 0cm^3 volume. There is no tunneling or undermining noted. There is a none present amount of drainage noted. There is no granulation within the wound bed. There is no necrotic tissue within the wound bed. Wound #6 status is Healed - Epithelialized. Original cause of wound was Skin Tear/Laceration. The date acquired was: 08/14/2020. The wound has been in treatment 3 weeks. The wound is located on the Right,Lateral,Anterior Elbow. The wound measures 0cm length x 0cm width x 0cm depth; 0cm^2 area and 0cm^3 volume. There is no tunneling or undermining noted. There is a none present amount of drainage noted. There is no granulation within the wound bed. There is no necrotic tissue within the wound bed. Assessment Active Problems ICD-10 Other specified  peripheral vascular diseases Non-pressure chronic ulcer of other part of right  foot with fat layer exposed Other chronic osteomyelitis, right ankle and foot Unspecified open wound of right elbow, initial encounter Unspecified open wound of left forearm, initial encounter Long term (current) use of anticoagulants Paroxysmal atrial fibrillation Tinea pedis Plan Discharge From [facility] Services: Discharge from [place]. ([id number]) 1. Would recommend currently that going continue with the wound care measures as before and the patient is in agreement with plan. This includes the use of the protective dressing over the lateral portion of her foot. I think this is good to be the best way to go this is again the right foot. 2. Regard to the antibiotic she should complete which she is currently taking and then will not need to take any further antibiotics at that point. 3. I am going to recommend as well that she elevate her legs to try to help with edema control also recommended larger shoes so that she does not squeeze her feet. Due to the discoloration of her legs I would recommend a follow-up with Dr. Lucky Cowboy in order to ensure that there is nothing else going on here that is going to be a bigger issue that needs to be addressed at this point from a vascular standpoint. We will see the patient back for follow-up visit as needed. Electronic Signature(s) Signed: 09/05/2020 5:33:24 PM By: Worthy Keeler PA-C Entered By: Worthy Keeler on 09/05/2020 17:33:24 [name], [name] ([id number]) -------------------------------------------------------------------------------- SuperBill Details Patient Name: [name] Date of Service: 09/05/2020 Medical Record Number: [id number] Patient Account Number: [id number] Date of Birth/Sex: 06-15-1929 (87 y.o. F) Treating RN: Dolan Amen Primary Care Provider: Ria Bush Other Clinician: Referring Provider:  Ria Bush Treating Provider/Extender: Skipper Cliche in Treatment: 47 Diagnosis Coding ICD-10 Codes Code Description I73.89 Other specified peripheral vascular diseases L97.512 Non-pressure chronic ulcer of other part of right foot with fat layer exposed M86.671 Other chronic osteomyelitis, right ankle and foot S51.001A Unspecified open wound of right elbow, initial encounter S51.802A Unspecified open wound of left forearm, initial encounter Z79.01 Long term (current) use of anticoagulants I48.0 Paroxysmal atrial fibrillation B35.3 Tinea pedis Facility Procedures CPT4 Code: 34917915 Description: [id number] - WOUND CARE VISIT-LEV 2 EST PT Modifier: Quantity: 1 Physician Procedures CPT4 Code: 9480165 Description: 53748 - WC PHYS LEVEL 3 - EST PT Modifier: Quantity: 1 CPT4 Code: Description: ICD-10 Diagnosis Description I73.89 Other specified peripheral vascular diseases L97.512 Non-pressure chronic ulcer of other part of right foot with fat layer e M86.671 Other chronic osteomyelitis, right ankle and foot S51.001A Unspecified  open wound of right elbow, initial encounter Modifier: xposed Quantity: Electronic Signature(s) Signed: 09/05/2020 5:34:02 PM By: Worthy Keeler PA-C Previous Signature: 09/05/2020 4:43:03 PM Version By: Georges Mouse, Minus Breeding RN Entered By: Worthy Keeler on 09/05/2020 17:34:02

## 2020-09-07 DIAGNOSIS — I5032 Chronic diastolic (congestive) heart failure: Secondary | ICD-10-CM | POA: Diagnosis not present

## 2020-09-07 DIAGNOSIS — K219 Gastro-esophageal reflux disease without esophagitis: Secondary | ICD-10-CM | POA: Diagnosis not present

## 2020-09-07 DIAGNOSIS — S62102D Fracture of unspecified carpal bone, left wrist, subsequent encounter for fracture with routine healing: Secondary | ICD-10-CM | POA: Diagnosis not present

## 2020-09-07 DIAGNOSIS — Z7982 Long term (current) use of aspirin: Secondary | ICD-10-CM | POA: Diagnosis not present

## 2020-09-07 DIAGNOSIS — M810 Age-related osteoporosis without current pathological fracture: Secondary | ICD-10-CM | POA: Diagnosis not present

## 2020-09-07 DIAGNOSIS — E538 Deficiency of other specified B group vitamins: Secondary | ICD-10-CM | POA: Diagnosis not present

## 2020-09-07 DIAGNOSIS — E559 Vitamin D deficiency, unspecified: Secondary | ICD-10-CM | POA: Diagnosis not present

## 2020-09-07 DIAGNOSIS — L97513 Non-pressure chronic ulcer of other part of right foot with necrosis of muscle: Secondary | ICD-10-CM | POA: Diagnosis not present

## 2020-09-07 DIAGNOSIS — I130 Hypertensive heart and chronic kidney disease with heart failure and stage 1 through stage 4 chronic kidney disease, or unspecified chronic kidney disease: Secondary | ICD-10-CM | POA: Diagnosis not present

## 2020-09-07 DIAGNOSIS — Z9181 History of falling: Secondary | ICD-10-CM | POA: Diagnosis not present

## 2020-09-07 DIAGNOSIS — G2581 Restless legs syndrome: Secondary | ICD-10-CM | POA: Diagnosis not present

## 2020-09-07 DIAGNOSIS — Z7901 Long term (current) use of anticoagulants: Secondary | ICD-10-CM | POA: Diagnosis not present

## 2020-09-07 DIAGNOSIS — G9009 Other idiopathic peripheral autonomic neuropathy: Secondary | ICD-10-CM | POA: Diagnosis not present

## 2020-09-07 DIAGNOSIS — N1831 Chronic kidney disease, stage 3a: Secondary | ICD-10-CM | POA: Diagnosis not present

## 2020-09-07 DIAGNOSIS — I872 Venous insufficiency (chronic) (peripheral): Secondary | ICD-10-CM | POA: Diagnosis not present

## 2020-09-07 DIAGNOSIS — I4891 Unspecified atrial fibrillation: Secondary | ICD-10-CM | POA: Diagnosis not present

## 2020-09-07 DIAGNOSIS — M5136 Other intervertebral disc degeneration, lumbar region: Secondary | ICD-10-CM | POA: Diagnosis not present

## 2020-09-07 DIAGNOSIS — I2510 Atherosclerotic heart disease of native coronary artery without angina pectoris: Secondary | ICD-10-CM | POA: Diagnosis not present

## 2020-09-07 DIAGNOSIS — I272 Pulmonary hypertension, unspecified: Secondary | ICD-10-CM | POA: Diagnosis not present

## 2020-09-07 DIAGNOSIS — E7849 Other hyperlipidemia: Secondary | ICD-10-CM | POA: Diagnosis not present

## 2020-09-07 DIAGNOSIS — M1990 Unspecified osteoarthritis, unspecified site: Secondary | ICD-10-CM | POA: Diagnosis not present

## 2020-09-07 DIAGNOSIS — Z792 Long term (current) use of antibiotics: Secondary | ICD-10-CM | POA: Diagnosis not present

## 2020-09-11 DIAGNOSIS — I272 Pulmonary hypertension, unspecified: Secondary | ICD-10-CM | POA: Diagnosis not present

## 2020-09-11 DIAGNOSIS — I5032 Chronic diastolic (congestive) heart failure: Secondary | ICD-10-CM | POA: Diagnosis not present

## 2020-09-11 DIAGNOSIS — I130 Hypertensive heart and chronic kidney disease with heart failure and stage 1 through stage 4 chronic kidney disease, or unspecified chronic kidney disease: Secondary | ICD-10-CM | POA: Diagnosis not present

## 2020-09-11 DIAGNOSIS — Z7982 Long term (current) use of aspirin: Secondary | ICD-10-CM | POA: Diagnosis not present

## 2020-09-11 DIAGNOSIS — G9009 Other idiopathic peripheral autonomic neuropathy: Secondary | ICD-10-CM | POA: Diagnosis not present

## 2020-09-11 DIAGNOSIS — K219 Gastro-esophageal reflux disease without esophagitis: Secondary | ICD-10-CM | POA: Diagnosis not present

## 2020-09-11 DIAGNOSIS — E538 Deficiency of other specified B group vitamins: Secondary | ICD-10-CM | POA: Diagnosis not present

## 2020-09-11 DIAGNOSIS — G2581 Restless legs syndrome: Secondary | ICD-10-CM | POA: Diagnosis not present

## 2020-09-11 DIAGNOSIS — I4891 Unspecified atrial fibrillation: Secondary | ICD-10-CM | POA: Diagnosis not present

## 2020-09-11 DIAGNOSIS — M1990 Unspecified osteoarthritis, unspecified site: Secondary | ICD-10-CM | POA: Diagnosis not present

## 2020-09-11 DIAGNOSIS — Z7901 Long term (current) use of anticoagulants: Secondary | ICD-10-CM | POA: Diagnosis not present

## 2020-09-11 DIAGNOSIS — Z9181 History of falling: Secondary | ICD-10-CM | POA: Diagnosis not present

## 2020-09-11 DIAGNOSIS — E7849 Other hyperlipidemia: Secondary | ICD-10-CM | POA: Diagnosis not present

## 2020-09-11 DIAGNOSIS — M5136 Other intervertebral disc degeneration, lumbar region: Secondary | ICD-10-CM | POA: Diagnosis not present

## 2020-09-11 DIAGNOSIS — M810 Age-related osteoporosis without current pathological fracture: Secondary | ICD-10-CM | POA: Diagnosis not present

## 2020-09-11 DIAGNOSIS — I2510 Atherosclerotic heart disease of native coronary artery without angina pectoris: Secondary | ICD-10-CM | POA: Diagnosis not present

## 2020-09-11 DIAGNOSIS — I872 Venous insufficiency (chronic) (peripheral): Secondary | ICD-10-CM | POA: Diagnosis not present

## 2020-09-11 DIAGNOSIS — N1831 Chronic kidney disease, stage 3a: Secondary | ICD-10-CM | POA: Diagnosis not present

## 2020-09-15 DIAGNOSIS — K219 Gastro-esophageal reflux disease without esophagitis: Secondary | ICD-10-CM | POA: Diagnosis not present

## 2020-09-15 DIAGNOSIS — I130 Hypertensive heart and chronic kidney disease with heart failure and stage 1 through stage 4 chronic kidney disease, or unspecified chronic kidney disease: Secondary | ICD-10-CM | POA: Diagnosis not present

## 2020-09-15 DIAGNOSIS — M810 Age-related osteoporosis without current pathological fracture: Secondary | ICD-10-CM | POA: Diagnosis not present

## 2020-09-15 DIAGNOSIS — Z7982 Long term (current) use of aspirin: Secondary | ICD-10-CM | POA: Diagnosis not present

## 2020-09-15 DIAGNOSIS — I2510 Atherosclerotic heart disease of native coronary artery without angina pectoris: Secondary | ICD-10-CM | POA: Diagnosis not present

## 2020-09-15 DIAGNOSIS — E7849 Other hyperlipidemia: Secondary | ICD-10-CM | POA: Diagnosis not present

## 2020-09-15 DIAGNOSIS — N1831 Chronic kidney disease, stage 3a: Secondary | ICD-10-CM | POA: Diagnosis not present

## 2020-09-15 DIAGNOSIS — I272 Pulmonary hypertension, unspecified: Secondary | ICD-10-CM | POA: Diagnosis not present

## 2020-09-15 DIAGNOSIS — Z9181 History of falling: Secondary | ICD-10-CM | POA: Diagnosis not present

## 2020-09-15 DIAGNOSIS — E538 Deficiency of other specified B group vitamins: Secondary | ICD-10-CM | POA: Diagnosis not present

## 2020-09-15 DIAGNOSIS — I872 Venous insufficiency (chronic) (peripheral): Secondary | ICD-10-CM | POA: Diagnosis not present

## 2020-09-15 DIAGNOSIS — Z7901 Long term (current) use of anticoagulants: Secondary | ICD-10-CM | POA: Diagnosis not present

## 2020-09-15 DIAGNOSIS — I4891 Unspecified atrial fibrillation: Secondary | ICD-10-CM | POA: Diagnosis not present

## 2020-09-15 DIAGNOSIS — I5032 Chronic diastolic (congestive) heart failure: Secondary | ICD-10-CM | POA: Diagnosis not present

## 2020-09-15 DIAGNOSIS — M1990 Unspecified osteoarthritis, unspecified site: Secondary | ICD-10-CM | POA: Diagnosis not present

## 2020-09-15 DIAGNOSIS — M5136 Other intervertebral disc degeneration, lumbar region: Secondary | ICD-10-CM | POA: Diagnosis not present

## 2020-09-15 DIAGNOSIS — G2581 Restless legs syndrome: Secondary | ICD-10-CM | POA: Diagnosis not present

## 2020-09-15 DIAGNOSIS — G9009 Other idiopathic peripheral autonomic neuropathy: Secondary | ICD-10-CM | POA: Diagnosis not present

## 2020-09-18 ENCOUNTER — Telehealth: Payer: Self-pay | Admitting: Family Medicine

## 2020-09-18 DIAGNOSIS — B0233 Zoster keratitis: Secondary | ICD-10-CM | POA: Diagnosis not present

## 2020-09-18 DIAGNOSIS — M5136 Other intervertebral disc degeneration, lumbar region: Secondary | ICD-10-CM | POA: Diagnosis not present

## 2020-09-18 DIAGNOSIS — G9009 Other idiopathic peripheral autonomic neuropathy: Secondary | ICD-10-CM | POA: Diagnosis not present

## 2020-09-18 DIAGNOSIS — I2510 Atherosclerotic heart disease of native coronary artery without angina pectoris: Secondary | ICD-10-CM | POA: Diagnosis not present

## 2020-09-18 DIAGNOSIS — G2581 Restless legs syndrome: Secondary | ICD-10-CM | POA: Diagnosis not present

## 2020-09-18 DIAGNOSIS — I272 Pulmonary hypertension, unspecified: Secondary | ICD-10-CM | POA: Diagnosis not present

## 2020-09-18 DIAGNOSIS — I130 Hypertensive heart and chronic kidney disease with heart failure and stage 1 through stage 4 chronic kidney disease, or unspecified chronic kidney disease: Secondary | ICD-10-CM | POA: Diagnosis not present

## 2020-09-18 DIAGNOSIS — I872 Venous insufficiency (chronic) (peripheral): Secondary | ICD-10-CM | POA: Diagnosis not present

## 2020-09-18 DIAGNOSIS — M810 Age-related osteoporosis without current pathological fracture: Secondary | ICD-10-CM | POA: Diagnosis not present

## 2020-09-18 DIAGNOSIS — Z9181 History of falling: Secondary | ICD-10-CM | POA: Diagnosis not present

## 2020-09-18 DIAGNOSIS — E7849 Other hyperlipidemia: Secondary | ICD-10-CM | POA: Diagnosis not present

## 2020-09-18 DIAGNOSIS — M1990 Unspecified osteoarthritis, unspecified site: Secondary | ICD-10-CM | POA: Diagnosis not present

## 2020-09-18 DIAGNOSIS — N1831 Chronic kidney disease, stage 3a: Secondary | ICD-10-CM | POA: Diagnosis not present

## 2020-09-18 DIAGNOSIS — E538 Deficiency of other specified B group vitamins: Secondary | ICD-10-CM | POA: Diagnosis not present

## 2020-09-18 DIAGNOSIS — K219 Gastro-esophageal reflux disease without esophagitis: Secondary | ICD-10-CM | POA: Diagnosis not present

## 2020-09-18 DIAGNOSIS — Z7982 Long term (current) use of aspirin: Secondary | ICD-10-CM | POA: Diagnosis not present

## 2020-09-18 DIAGNOSIS — I5032 Chronic diastolic (congestive) heart failure: Secondary | ICD-10-CM | POA: Diagnosis not present

## 2020-09-18 DIAGNOSIS — I4891 Unspecified atrial fibrillation: Secondary | ICD-10-CM | POA: Diagnosis not present

## 2020-09-18 DIAGNOSIS — Z7901 Long term (current) use of anticoagulants: Secondary | ICD-10-CM | POA: Diagnosis not present

## 2020-09-18 NOTE — Telephone Encounter (Signed)
New message    [facility] calling checking on the status of fax sent over on  06/10 @ 4:44pm

## 2020-09-19 DIAGNOSIS — K219 Gastro-esophageal reflux disease without esophagitis: Secondary | ICD-10-CM

## 2020-09-19 DIAGNOSIS — Z9181 History of falling: Secondary | ICD-10-CM

## 2020-09-19 DIAGNOSIS — M810 Age-related osteoporosis without current pathological fracture: Secondary | ICD-10-CM | POA: Diagnosis not present

## 2020-09-19 DIAGNOSIS — I130 Hypertensive heart and chronic kidney disease with heart failure and stage 1 through stage 4 chronic kidney disease, or unspecified chronic kidney disease: Secondary | ICD-10-CM | POA: Diagnosis not present

## 2020-09-19 DIAGNOSIS — F419 Anxiety disorder, unspecified: Secondary | ICD-10-CM

## 2020-09-19 DIAGNOSIS — I2510 Atherosclerotic heart disease of native coronary artery without angina pectoris: Secondary | ICD-10-CM | POA: Diagnosis not present

## 2020-09-19 DIAGNOSIS — R32 Unspecified urinary incontinence: Secondary | ICD-10-CM

## 2020-09-19 DIAGNOSIS — Z7901 Long term (current) use of anticoagulants: Secondary | ICD-10-CM

## 2020-09-19 DIAGNOSIS — M1990 Unspecified osteoarthritis, unspecified site: Secondary | ICD-10-CM | POA: Diagnosis not present

## 2020-09-19 DIAGNOSIS — M5136 Other intervertebral disc degeneration, lumbar region: Secondary | ICD-10-CM | POA: Diagnosis not present

## 2020-09-19 DIAGNOSIS — E538 Deficiency of other specified B group vitamins: Secondary | ICD-10-CM

## 2020-09-19 DIAGNOSIS — G2581 Restless legs syndrome: Secondary | ICD-10-CM | POA: Diagnosis not present

## 2020-09-19 DIAGNOSIS — E7849 Other hyperlipidemia: Secondary | ICD-10-CM

## 2020-09-19 DIAGNOSIS — I872 Venous insufficiency (chronic) (peripheral): Secondary | ICD-10-CM | POA: Diagnosis not present

## 2020-09-19 DIAGNOSIS — I272 Pulmonary hypertension, unspecified: Secondary | ICD-10-CM | POA: Diagnosis not present

## 2020-09-19 DIAGNOSIS — I5032 Chronic diastolic (congestive) heart failure: Secondary | ICD-10-CM | POA: Diagnosis not present

## 2020-09-19 DIAGNOSIS — Z7982 Long term (current) use of aspirin: Secondary | ICD-10-CM

## 2020-09-19 DIAGNOSIS — G9009 Other idiopathic peripheral autonomic neuropathy: Secondary | ICD-10-CM | POA: Diagnosis not present

## 2020-09-19 DIAGNOSIS — N1831 Chronic kidney disease, stage 3a: Secondary | ICD-10-CM | POA: Diagnosis not present

## 2020-09-19 DIAGNOSIS — I4891 Unspecified atrial fibrillation: Secondary | ICD-10-CM | POA: Diagnosis not present

## 2020-09-19 NOTE — Telephone Encounter (Signed)
Spoke with Deanna discussing faxed orders in question.  Informed her I don't see that we've received them.  States she will resend today.  

## 2020-09-20 DIAGNOSIS — Z7982 Long term (current) use of aspirin: Secondary | ICD-10-CM | POA: Diagnosis not present

## 2020-09-20 DIAGNOSIS — G9009 Other idiopathic peripheral autonomic neuropathy: Secondary | ICD-10-CM | POA: Diagnosis not present

## 2020-09-20 DIAGNOSIS — E538 Deficiency of other specified B group vitamins: Secondary | ICD-10-CM | POA: Diagnosis not present

## 2020-09-20 DIAGNOSIS — G2581 Restless legs syndrome: Secondary | ICD-10-CM | POA: Diagnosis not present

## 2020-09-20 DIAGNOSIS — I272 Pulmonary hypertension, unspecified: Secondary | ICD-10-CM | POA: Diagnosis not present

## 2020-09-20 DIAGNOSIS — Z9181 History of falling: Secondary | ICD-10-CM | POA: Diagnosis not present

## 2020-09-20 DIAGNOSIS — M810 Age-related osteoporosis without current pathological fracture: Secondary | ICD-10-CM | POA: Diagnosis not present

## 2020-09-20 DIAGNOSIS — I130 Hypertensive heart and chronic kidney disease with heart failure and stage 1 through stage 4 chronic kidney disease, or unspecified chronic kidney disease: Secondary | ICD-10-CM | POA: Diagnosis not present

## 2020-09-20 DIAGNOSIS — M5136 Other intervertebral disc degeneration, lumbar region: Secondary | ICD-10-CM | POA: Diagnosis not present

## 2020-09-20 DIAGNOSIS — M1990 Unspecified osteoarthritis, unspecified site: Secondary | ICD-10-CM | POA: Diagnosis not present

## 2020-09-20 DIAGNOSIS — I2510 Atherosclerotic heart disease of native coronary artery without angina pectoris: Secondary | ICD-10-CM | POA: Diagnosis not present

## 2020-09-20 DIAGNOSIS — E7849 Other hyperlipidemia: Secondary | ICD-10-CM | POA: Diagnosis not present

## 2020-09-20 DIAGNOSIS — I5032 Chronic diastolic (congestive) heart failure: Secondary | ICD-10-CM | POA: Diagnosis not present

## 2020-09-20 DIAGNOSIS — K219 Gastro-esophageal reflux disease without esophagitis: Secondary | ICD-10-CM | POA: Diagnosis not present

## 2020-09-20 DIAGNOSIS — N1831 Chronic kidney disease, stage 3a: Secondary | ICD-10-CM | POA: Diagnosis not present

## 2020-09-20 DIAGNOSIS — Z7901 Long term (current) use of anticoagulants: Secondary | ICD-10-CM | POA: Diagnosis not present

## 2020-09-20 DIAGNOSIS — I872 Venous insufficiency (chronic) (peripheral): Secondary | ICD-10-CM | POA: Diagnosis not present

## 2020-09-20 DIAGNOSIS — I4891 Unspecified atrial fibrillation: Secondary | ICD-10-CM | POA: Diagnosis not present

## 2020-09-21 ENCOUNTER — Other Ambulatory Visit: Payer: Self-pay

## 2020-09-21 ENCOUNTER — Telehealth: Payer: Self-pay

## 2020-09-21 ENCOUNTER — Encounter: Payer: Self-pay | Admitting: Adult Health Nurse Practitioner

## 2020-09-21 ENCOUNTER — Other Ambulatory Visit: Payer: Medicare Other | Admitting: Adult Health Nurse Practitioner

## 2020-09-21 VITALS — BP 132/78 | HR 52 | Wt 132.0 lb

## 2020-09-21 DIAGNOSIS — Z515 Encounter for palliative care: Secondary | ICD-10-CM | POA: Diagnosis not present

## 2020-09-21 DIAGNOSIS — F0390 Unspecified dementia without behavioral disturbance: Secondary | ICD-10-CM

## 2020-09-21 DIAGNOSIS — I5032 Chronic diastolic (congestive) heart failure: Secondary | ICD-10-CM

## 2020-09-21 DIAGNOSIS — D509 Iron deficiency anemia, unspecified: Secondary | ICD-10-CM

## 2020-09-21 DIAGNOSIS — R600 Localized edema: Secondary | ICD-10-CM

## 2020-09-21 DIAGNOSIS — L97519 Non-pressure chronic ulcer of other part of right foot with unspecified severity: Secondary | ICD-10-CM | POA: Diagnosis not present

## 2020-09-21 NOTE — Progress Notes (Signed)
[facility] Pall[facility] Consult Note Telephone: [phone]  Fax: [phone]    Date of encounter: 09/21/20 PATIENT NAME: [name] [address] [place] [address]   [phone] (home)  DOB: February 10, 1930 MRN: [id number] PRIMARY CARE PROVIDER:    Ria Bush, MD,  [place] [address] [phone]  REFERRING PROVIDER:   Ria Bush, MD [address] [place],  [place] [address] [phone]  RESPONSIBLE PARTY:    Contact Information     Name Relation Home Work [place] Daughter [phone]     [name] Daughter [phone]  [phone]   [name] Daughter [phone]  [phone]   Lynn Daughter   [phone]        I met face to face with patient and family in home. Palliative Care was asked to follow this patient by consultation request of  Ria Bush, MD to address advance care planning and complex medical decision making. This is a follow up visit.  Daughter, [name], present during visit today                                   ASSESSMENT AND PLAN / RECOMMENDATIONS:   Advance Care Planning/Goals of Care: Goals include to maximize quality of life and symptom management. CODE STATUS: DNR  Symptom Management/Plan:  CHF: Patient has edema that comes and goes.  Does get relief with propping.  Patient is not on any diuretic but this could be due to her poor kidney function.  Continue follow-up and recommendations by vein and vascular.  Dementia: Patient has very mild dementia.  Is able to hold conversation and make needs known.  Does have increasing forgetfulness and will repeat herself during conversation.  Follows commands well.  Right foot wound: This is nearly resolved and she no longer needs to be followed by wound clinic at this time.  Daughter did have questions about transitioning from aide with home health and has home  health will probably end soon now that wound is resolved.  Has previously been given list of agencies and has encouraged her to use this to check into 8 help in the home.  Have encouraged her also to reach out to her insurance company to see if 8 help in the home is covered and with agencies that they may work with if they do cover in-home help.   Follow up Palliative Care Visit: Palliative care will continue to follow for complex medical decision making, advance care planning, and clarification of goals. Will have office call to set up next appointment in 8-10 weeks.  Encouraged to call with any questions or concerns  I spent 60 minutes providing this consultation. More than 50% of the time in this consultation was spent in counseling and care coordination.   PPS: 60%  HOSPICE ELIGIBILITY/DIAGNOSIS: TBD  Chief Complaint: follow up palliative visit  HISTORY OF PRESENT ILLNESS:  [name] is a 87 y.o. year old female  with mild dementia, CHF, HTN, PVD with chronic ulcer of right foot, peripheral neuropathy, GERD, HLD, A. fib, RLS, CAD, CKD D stage III .  Patient does voice that she is tired today as she had a busy day yesterday.  Still complains of neuropathy pain in her feet but gets relief with current pain regimen.  States that her appetite is good and has actually gained a few pounds and  has been consistently in the 130s.  Though this may be deceiving as she has been having increased edema.  Edema today is around 3+ around her ankles and feet and decreases to 1+ below the knees.  Daughter does state that this is decreased compared to the edema she had about a week ago.  She does get relief with propping her feet up.  Patient has appointment with vein and vascular on 09/29/2020.  Denies increased shortness of breath or cough, chest pain, palpitations.  Patient did have biopsy of skin lesion to upper chest which showed slow-growing malignant carcinoma.  Does have appointment in early August to  have this cut out.  Denies headaches, dizziness, N/V/D, constipation, dysuria, hematuria.  History obtained from review of EMR and interview with family and Ms. Winfree.    PHYSICAL EXAM:    General: NAD, frail appearing Eyes: Sclera anicteric and noninjected with no discharge noted Cardiovascular: regular rate and rhythm Pulmonary: Lung sounds clear; normal respiratory effort Abdomen: soft, nontender, + bowel sounds Extremities: up to 3+edema to left foot, no joint deformities; feet are purple in appearance which is relieved with propping Skin: no rashes on exposed skin.  Has wound to lateral side of right foot mostly resolved Neurological: Weakness but otherwise nonfocal; has forgetfulness   Thank you for the opportunity to participate in the care of Ms. Faulks.  The palliative care team will continue to follow. Please call our office at [phone] if we can be of additional assistance.   Jathen Sudano Jenetta Downer, NP , DNP  This chart was dictated using voice recognition software.  Despite best efforts to proofread,  errors can occur which can change the documentation meaning.   COVID-19 PATIENT SCREENING TOOL Asked and negative response unless otherwise noted:   Have you had symptoms of covid, tested positive or been in contact with someone with symptoms/positive test in the past 5-10 days?

## 2020-09-21 NOTE — Telephone Encounter (Signed)
Patient is having issues with feet and ankle swelling to the point it is painful to walk and turn blue and red. This improves with elevation of her legs. Patient does have an appointment with her vein and vascular next week on 09/29/20 for follow up. Daughter, [name] does not think she would want patient to take fluid pill due to the risks of that medication.  [facility] nurse has been seen patient once a week to follow up on wounds in her feet-this is better. Nurse advised [name] to let us know about this. Swelling has been present for a long time per daughter. Nurse said she has Edema 3+ pitting of her feet. Patient is not having any SOB, no chest pain, no distress. Daughter states she just wanted to update [place] on this. Patient is just having pain from the swelling.  [name] is asking for any recommendations from Dr Darnell Level or to just follow up with vein and vascular on this.

## 2020-09-22 ENCOUNTER — Encounter: Payer: Self-pay | Admitting: Family Medicine

## 2020-09-22 DIAGNOSIS — E7849 Other hyperlipidemia: Secondary | ICD-10-CM | POA: Diagnosis not present

## 2020-09-22 DIAGNOSIS — I872 Venous insufficiency (chronic) (peripheral): Secondary | ICD-10-CM | POA: Diagnosis not present

## 2020-09-22 DIAGNOSIS — Z9181 History of falling: Secondary | ICD-10-CM | POA: Diagnosis not present

## 2020-09-22 DIAGNOSIS — M810 Age-related osteoporosis without current pathological fracture: Secondary | ICD-10-CM | POA: Diagnosis not present

## 2020-09-22 DIAGNOSIS — G2581 Restless legs syndrome: Secondary | ICD-10-CM | POA: Diagnosis not present

## 2020-09-22 DIAGNOSIS — M1990 Unspecified osteoarthritis, unspecified site: Secondary | ICD-10-CM | POA: Diagnosis not present

## 2020-09-22 DIAGNOSIS — R600 Localized edema: Secondary | ICD-10-CM | POA: Insufficient documentation

## 2020-09-22 DIAGNOSIS — G9009 Other idiopathic peripheral autonomic neuropathy: Secondary | ICD-10-CM | POA: Diagnosis not present

## 2020-09-22 DIAGNOSIS — I4891 Unspecified atrial fibrillation: Secondary | ICD-10-CM | POA: Diagnosis not present

## 2020-09-22 DIAGNOSIS — Z7901 Long term (current) use of anticoagulants: Secondary | ICD-10-CM | POA: Diagnosis not present

## 2020-09-22 DIAGNOSIS — I2510 Atherosclerotic heart disease of native coronary artery without angina pectoris: Secondary | ICD-10-CM | POA: Diagnosis not present

## 2020-09-22 DIAGNOSIS — Z7982 Long term (current) use of aspirin: Secondary | ICD-10-CM | POA: Diagnosis not present

## 2020-09-22 DIAGNOSIS — N1831 Chronic kidney disease, stage 3a: Secondary | ICD-10-CM | POA: Diagnosis not present

## 2020-09-22 DIAGNOSIS — K219 Gastro-esophageal reflux disease without esophagitis: Secondary | ICD-10-CM | POA: Diagnosis not present

## 2020-09-22 DIAGNOSIS — I130 Hypertensive heart and chronic kidney disease with heart failure and stage 1 through stage 4 chronic kidney disease, or unspecified chronic kidney disease: Secondary | ICD-10-CM | POA: Diagnosis not present

## 2020-09-22 DIAGNOSIS — E538 Deficiency of other specified B group vitamins: Secondary | ICD-10-CM | POA: Diagnosis not present

## 2020-09-22 DIAGNOSIS — I272 Pulmonary hypertension, unspecified: Secondary | ICD-10-CM | POA: Diagnosis not present

## 2020-09-22 DIAGNOSIS — M5136 Other intervertebral disc degeneration, lumbar region: Secondary | ICD-10-CM | POA: Diagnosis not present

## 2020-09-22 DIAGNOSIS — I5032 Chronic diastolic (congestive) heart failure: Secondary | ICD-10-CM | POA: Diagnosis not present

## 2020-09-22 NOTE — Telephone Encounter (Signed)
Continue leg elevation, would she want to retry compression ACE wraps to legs previously tried?  Alternative is diuretic but I understand why they want to avoid. Am interested to see what [facility] recommends.

## 2020-09-22 NOTE — Telephone Encounter (Signed)
Spoke with pt's daughter, [name] (on dpr) relaying Dr. Synthia Innocent message.  She verbalizes understanding.  States for now they will just continue leg elevation until vascular appt.  Does not want to use ACE wraps again or diuretic at this time.

## 2020-09-25 DIAGNOSIS — Z7982 Long term (current) use of aspirin: Secondary | ICD-10-CM | POA: Diagnosis not present

## 2020-09-25 DIAGNOSIS — M810 Age-related osteoporosis without current pathological fracture: Secondary | ICD-10-CM | POA: Diagnosis not present

## 2020-09-25 DIAGNOSIS — Z7901 Long term (current) use of anticoagulants: Secondary | ICD-10-CM | POA: Diagnosis not present

## 2020-09-25 DIAGNOSIS — M5136 Other intervertebral disc degeneration, lumbar region: Secondary | ICD-10-CM | POA: Diagnosis not present

## 2020-09-25 DIAGNOSIS — I272 Pulmonary hypertension, unspecified: Secondary | ICD-10-CM | POA: Diagnosis not present

## 2020-09-25 DIAGNOSIS — I130 Hypertensive heart and chronic kidney disease with heart failure and stage 1 through stage 4 chronic kidney disease, or unspecified chronic kidney disease: Secondary | ICD-10-CM | POA: Diagnosis not present

## 2020-09-25 DIAGNOSIS — E538 Deficiency of other specified B group vitamins: Secondary | ICD-10-CM | POA: Diagnosis not present

## 2020-09-25 DIAGNOSIS — I2510 Atherosclerotic heart disease of native coronary artery without angina pectoris: Secondary | ICD-10-CM | POA: Diagnosis not present

## 2020-09-25 DIAGNOSIS — N1831 Chronic kidney disease, stage 3a: Secondary | ICD-10-CM | POA: Diagnosis not present

## 2020-09-25 DIAGNOSIS — K219 Gastro-esophageal reflux disease without esophagitis: Secondary | ICD-10-CM | POA: Diagnosis not present

## 2020-09-25 DIAGNOSIS — I4891 Unspecified atrial fibrillation: Secondary | ICD-10-CM | POA: Diagnosis not present

## 2020-09-25 DIAGNOSIS — E7849 Other hyperlipidemia: Secondary | ICD-10-CM | POA: Diagnosis not present

## 2020-09-25 DIAGNOSIS — I872 Venous insufficiency (chronic) (peripheral): Secondary | ICD-10-CM | POA: Diagnosis not present

## 2020-09-25 DIAGNOSIS — I5032 Chronic diastolic (congestive) heart failure: Secondary | ICD-10-CM | POA: Diagnosis not present

## 2020-09-25 DIAGNOSIS — Z9181 History of falling: Secondary | ICD-10-CM | POA: Diagnosis not present

## 2020-09-25 DIAGNOSIS — G2581 Restless legs syndrome: Secondary | ICD-10-CM | POA: Diagnosis not present

## 2020-09-25 DIAGNOSIS — M1990 Unspecified osteoarthritis, unspecified site: Secondary | ICD-10-CM | POA: Diagnosis not present

## 2020-09-25 DIAGNOSIS — G9009 Other idiopathic peripheral autonomic neuropathy: Secondary | ICD-10-CM | POA: Diagnosis not present

## 2020-09-27 ENCOUNTER — Encounter: Payer: Medicare Other | Admitting: Dermatology

## 2020-09-29 ENCOUNTER — Encounter (INDEPENDENT_AMBULATORY_CARE_PROVIDER_SITE_OTHER): Payer: Self-pay | Admitting: Vascular Surgery

## 2020-09-29 ENCOUNTER — Ambulatory Visit (INDEPENDENT_AMBULATORY_CARE_PROVIDER_SITE_OTHER): Payer: Medicare Other | Admitting: Vascular Surgery

## 2020-09-29 ENCOUNTER — Other Ambulatory Visit: Payer: Self-pay

## 2020-09-29 ENCOUNTER — Ambulatory Visit (INDEPENDENT_AMBULATORY_CARE_PROVIDER_SITE_OTHER): Payer: Medicare Other

## 2020-09-29 VITALS — BP 145/69 | HR 53 | Resp 15

## 2020-09-29 DIAGNOSIS — I4819 Other persistent atrial fibrillation: Secondary | ICD-10-CM

## 2020-09-29 DIAGNOSIS — I272 Pulmonary hypertension, unspecified: Secondary | ICD-10-CM | POA: Diagnosis not present

## 2020-09-29 DIAGNOSIS — I739 Peripheral vascular disease, unspecified: Secondary | ICD-10-CM

## 2020-09-29 DIAGNOSIS — I10 Essential (primary) hypertension: Secondary | ICD-10-CM

## 2020-09-29 DIAGNOSIS — G9009 Other idiopathic peripheral autonomic neuropathy: Secondary | ICD-10-CM | POA: Diagnosis not present

## 2020-09-29 DIAGNOSIS — N1831 Chronic kidney disease, stage 3a: Secondary | ICD-10-CM | POA: Diagnosis not present

## 2020-09-29 DIAGNOSIS — M1990 Unspecified osteoarthritis, unspecified site: Secondary | ICD-10-CM | POA: Diagnosis not present

## 2020-09-29 DIAGNOSIS — M810 Age-related osteoporosis without current pathological fracture: Secondary | ICD-10-CM | POA: Diagnosis not present

## 2020-09-29 DIAGNOSIS — I130 Hypertensive heart and chronic kidney disease with heart failure and stage 1 through stage 4 chronic kidney disease, or unspecified chronic kidney disease: Secondary | ICD-10-CM | POA: Diagnosis not present

## 2020-09-29 DIAGNOSIS — K219 Gastro-esophageal reflux disease without esophagitis: Secondary | ICD-10-CM | POA: Diagnosis not present

## 2020-09-29 DIAGNOSIS — E7849 Other hyperlipidemia: Secondary | ICD-10-CM | POA: Diagnosis not present

## 2020-09-29 DIAGNOSIS — M5136 Other intervertebral disc degeneration, lumbar region: Secondary | ICD-10-CM | POA: Diagnosis not present

## 2020-09-29 DIAGNOSIS — I5032 Chronic diastolic (congestive) heart failure: Secondary | ICD-10-CM | POA: Diagnosis not present

## 2020-09-29 DIAGNOSIS — E538 Deficiency of other specified B group vitamins: Secondary | ICD-10-CM | POA: Diagnosis not present

## 2020-09-29 DIAGNOSIS — Z7982 Long term (current) use of aspirin: Secondary | ICD-10-CM | POA: Diagnosis not present

## 2020-09-29 DIAGNOSIS — I4891 Unspecified atrial fibrillation: Secondary | ICD-10-CM | POA: Diagnosis not present

## 2020-09-29 DIAGNOSIS — Z7901 Long term (current) use of anticoagulants: Secondary | ICD-10-CM | POA: Diagnosis not present

## 2020-09-29 DIAGNOSIS — G2581 Restless legs syndrome: Secondary | ICD-10-CM | POA: Diagnosis not present

## 2020-09-29 DIAGNOSIS — I872 Venous insufficiency (chronic) (peripheral): Secondary | ICD-10-CM | POA: Diagnosis not present

## 2020-09-29 DIAGNOSIS — Z9181 History of falling: Secondary | ICD-10-CM | POA: Diagnosis not present

## 2020-09-29 DIAGNOSIS — I2510 Atherosclerotic heart disease of native coronary artery without angina pectoris: Secondary | ICD-10-CM | POA: Diagnosis not present

## 2020-09-29 NOTE — Assessment & Plan Note (Signed)
Her ABIs today are 1.15 on the right and 1.03 on the left.  Her digit pressures are over 100 on the right and 91 on the left.  Legs are doing well.  No limb threatening symptoms.  Recheck in 6 months.  Continue current medical regimen.

## 2020-09-29 NOTE — Progress Notes (Signed)
MRN : [id number]  [name] is a 87 y.o. (05-04-1929) female who presents with chief complaint of  Chief Complaint  Patient presents with   Follow-up    Ultrasound follow up  .  History of Present Illness: Patient returns today in follow up of her PAD.  She returns earlier than her scheduled visit with some increased swelling and pain few weeks ago.  This seems to be doing better now.  Her feet color look good.  Her ABIs today are 1.15 on the right and 1.03 on the left.  Her digit pressures are over 100 on the right and 91 on the left.  Her previous ulcer has healed.  Overall her legs she says are doing as well as they have in a while.  Current Outpatient Medications  Medication Sig Dispense Refill   acetaminophen (TYLENOL) 500 MG tablet Take 1,000 mg every 8 (eight) hours as needed by mouth for moderate pain.      acyclovir (ZOVIRAX) 400 MG tablet Take 400 mg by mouth 2 (two) times daily.     amiodarone (PACERONE) 200 MG tablet TAKE 1/2 TABLET (100MG ) BY MOUTH ONCE DAILY 15 tablet 3   aspirin EC 81 MG tablet Take 1 tablet (81 mg total) by mouth daily. 150 tablet 2   atorvastatin (LIPITOR) 20 MG tablet TAKE 1 TABLET BY MOUTH ONCE DAILY 90 tablet 1   ciclopirox (LOPROX) 0.77 % cream Apply topically 2 (two) times daily. Apply to entire foot including between toes of both feet 30 g 4   doxycycline (VIBRAMYCIN) 100 MG capsule Take 100 mg by mouth 2 (two) times daily.     DULoxetine (CYMBALTA) 60 MG capsule TAKE 1 CAPSULE BY MOUTH ONCE DAILY 90 capsule 1   ELIQUIS 2.5 MG TABS tablet TAKE 1 TABLET BY MOUTH TWICE DAILY 180 tablet 1   gabapentin (NEURONTIN) 400 MG capsule Take 1 capsule (400 mg total) by mouth at bedtime. With second dose during day as needed 150 capsule 1   lisinopril (ZESTRIL) 10 MG tablet Take 1 tablet (10 mg total) by mouth daily. 30 tablet 6   memantine (NAMENDA) 5 MG tablet TAKE 1 TABLET BY MOUTH TWICE DAILY 60 tablet 6   metoprolol tartrate (LOPRESSOR) 25 MG tablet  TAKE 1/2 TABLET BY MOUTH TWICE DAILY 90 tablet 3   pantoprazole (PROTONIX) 40 MG tablet TAKE 1 TABLET BY MOUTH EVERY DAY 90 tablet 1   polyethylene glycol (MIRALAX / GLYCOLAX) 17 g packet Take 17 g by mouth daily as needed for mild constipation. 14 each 0   prednisoLONE acetate (PRED FORTE) 1 % ophthalmic suspension Place 1 drop into the left eye daily.      rOPINIRole (REQUIP) 1 MG tablet Take 2.5 tablets (2.5 mg total) by mouth at bedtime. 225 tablet 1   senna (SENOKOT) 8.6 MG TABS tablet Take 1 tablet (8.6 mg total) by mouth daily as needed for mild constipation.     traMADol (ULTRAM) 50 MG tablet Take 50 mg by mouth daily. Takes 2 tablets daily  0   Vitamin D, Ergocalciferol, (DRISDOL) 1.25 MG (50000 UNIT) CAPS capsule TAKE 1 CAPSULE BY MOUTH ONCE WEEKLY 12 capsule 3   Sulfamethoxazole-Trimethoprim (BACTRIM PO) Take by mouth. Taking for foot wound     No current facility-administered medications for this visit.    Past Medical History:  Diagnosis Date   Abdominal aortic atherosclerosis ([place]) 09/2015   by xray   Anxiety    BCC (basal cell carcinoma  of skin) 05/2011   on nose   BCC (basal cell carcinoma of skin) 08/09/2020   upper chest   Breast lesion    a. diagnosed with complex sclerosing lesion with calcifications of the right breast in 08/2016   Chest pain    a. nuclear stress test 03/2012: normal; b. 09/2018 MV: EF >65%, no isch/scar.    DDD (degenerative disc disease), lumbosacral 12/06/1992   Gallstones 09/2015   incidentally by xray   GERD (gastroesophageal reflux disease) 05/1999   HERPES ZOSTER 04/13/2007   Qualifier: Diagnosis of  By: [name], [name]    Hiatal hernia    History of shingles    ophthalmic, takes acyclovir daily   Hyperlipidemia 12/2003   Hypertension 1990   Mitral regurgitation    a. echo 03/2012: EF 60-65%, no RWMA, mild to mod AI/MR, mod TR, PASP 37 mmHg   Osteoarthritis 08/21/1989   Osteoporosis with fracture 11/1997   with compression  fracture T12   Persistent atrial fibrillation ([place]) 03/16/2012   a. CHADS2VASc => 5 (HTN, age x 2, vascular disease, sex category)-->Eliquis 5 BID;  b. Recurrent AF in 06/2016;  c. 01/2017 s/p DCCV;  d. 02/2017 recurrent AFib->Amio added->DCCV; e. 03/2017 recurrent AFib, s/p reload of amio and DCCV 04/02/2017   POSTHERPETIC NEURALGIA 04/20/2007   Qualifier: Diagnosis of  By: [name], Billie-Lynn Daniels    Rheumatoid arthritis ([place])    RLS (restless legs syndrome)     Past Surgical History:  Procedure Laterality Date   ABDOMINAL HYSTERECTOMY  Age 59 - 31   S/P TAH   abdominal ultrasound  04/23/2004   Gallstones   BREAST EXCISIONAL BIOPSY Right 2018   neg/benign complex sclerosing lesion   BREAST LUMPECTOMY WITH RADIOACTIVE SEED LOCALIZATION Right 10/2016   breast lumpectomy with radioactive seed localization and margin assessment for complex sclerosing lesion ([place])   BREAST LUMPECTOMY WITH RADIOACTIVE SEED LOCALIZATION Right 11/05/2016   Procedure: RIGHT BREAST LUMPECTOMY WITH RADIOACTIVE SEED LOCALIZATION;  Surgeon: Fanny Skates, MD;  Location: [facility];  Service: General;  Laterality: Right;   CARDIAC CATHETERIZATION     CARDIOVERSION N/A 01/31/2017   Procedure: CARDIOVERSION;  Surgeon: Wellington Hampshire, MD;  Location: [facility];  Service: Cardiovascular;  Laterality: N/A;   CARDIOVERSION N/A 03/03/2017   Procedure: CARDIOVERSION;  Surgeon: Wellington Hampshire, MD;  Location: [facility];  Service: Cardiovascular;  Laterality: N/A;   CARDIOVERSION N/A 04/02/2017   Procedure: CARDIOVERSION;  Surgeon: Wellington Hampshire, MD;  Location: [facility];  Service: Cardiovascular;  Laterality: N/A;   CARDIOVERSION N/A 01/16/2018   Procedure: CARDIOVERSION;  Surgeon: Wellington Hampshire, MD;  Location: [facility];  Service: Cardiovascular;  Laterality: N/A;   CATARACT EXTRACTION  05/2010   left eye   CERVICAL DISCECTOMY  1992   Fusion (Dr. Joya Salm)   ESI Bilateral 01/2016   S1  transforaminal ESI x3   ESOPHAGOGASTRODUODENOSCOPY  01/01/2002   with ulcer, bx. neg; + stricture gastric ulcer with hemorrhage   ESOPHAGOGASTRODUODENOSCOPY  04/23/2004   Esophageal stricture -- dilated   INTRAVASCULAR PRESSURE WIRE/FFR STUDY N/A 07/12/2019   Procedure: INTRAVASCULAR PRESSURE WIRE/FFR STUDY;  Surgeon: Wellington Hampshire, MD;  Location: [facility];  Service: Cardiovascular;  Laterality: N/A;   LOWER EXTREMITY ANGIOGRAPHY Left 02/01/2019   LOWER EXTREMITY ANGIOGRAPHY;  Surgeon: Algernon Huxley, MD   LOWER EXTREMITY ANGIOGRAPHY Right 03/25/2019   Procedure: LOWER EXTREMITY ANGIOGRAPHY;  Surgeon: Algernon Huxley, MD;  Location: [facility];  Service: Cardiovascular;  Laterality: Right;   LOWER EXTREMITY ANGIOGRAPHY Right 08/02/2019   Procedure: LOWER EXTREMITY ANGIOGRAPHY;  Surgeon: Algernon Huxley, MD;  Location: [facility];  Service: Cardiovascular;  Laterality: Right;   nuclear stress test  03/2012   no ischemia   PERIPHERAL VASCULAR BALLOON ANGIOPLASTY Left 01/2019   Percutaneous transluminal angioplasty of left anterior and posterior tibial artery with 2.5 mm diameter by 22 cm length angioplasty balloon (Kamariah Fruchter)   RIGHT/LEFT HEART CATH AND CORONARY ANGIOGRAPHY Bilateral 07/12/2019   Procedure: RIGHT/LEFT HEART CATH AND CORONARY ANGIOGRAPHY;  Surgeon: Wellington Hampshire, MD;  Location: [facility];  Service: Cardiovascular;  Laterality: Bilateral;   SKIN CANCER EXCISION  05/20/2011   BCC from nose   US ECHOCARDIOGRAPHY  03/2012   in flutter, EF 60%, mild-mod aortic, mitral, tricuspid regurg, mildly dilated LA     Social History   Tobacco Use   Smoking status: Former    Packs/day: 0.25    Years: 1.00    Pack years: 0.25    Types: Cigarettes    Quit date: 04/08/1976    Years since quitting: 44.5   Smokeless tobacco: Never   Tobacco comments:    pt smokes occasionally "socially"  Vaping Use   Vaping Use: Never used  Substance Use Topics    Alcohol use: No    Alcohol/week: 1.0 standard drink    Types: 1 Glasses of wine per week   Drug use: No       Family History  Problem Relation Age of Onset   Emphysema Mother    Heart disease Father        MI   Stroke Father        first at age 55.   Stroke Sister    Diabetes Sister    Hypertension Brother    Heart disease Brother        Heart Valve   Stroke Brother    Diabetes Brother    Cancer Brother        esophageal   Diabetes Brother    Diabetes Brother    Colon cancer Neg Hx      Allergies  Allergen Reactions   Penicillins Rash and Other (See Comments)    Childhood Allergy Has patient had a PCN reaction causing immediate rash, facial/tongue/throat swelling, SOB or lightheadedness with hypotension: Yes Has patient had a PCN reaction causing severe rash involving mucus membranes or skin necrosis: Unknown Has patient had a PCN reaction that required hospitalization: No Has patient had a PCN reaction occurring within the last 10 years: No If all of the above answers are "NO", then may proceed with Cephalosporin use.        REVIEW OF SYSTEMS (Negative unless checked)   Constitutional: [] Weight loss  [] Fever  [] Chills Cardiac: [] Chest pain   [] Chest pressure   [x] Palpitations   [] Shortness of breath when laying flat   [] Shortness of breath at rest   [x] Shortness of breath with exertion. Vascular:  [x] Pain in legs with walking   [x] Pain in legs at rest   [] Pain in legs when laying flat   [] Claudication   [] Pain in feet when walking  [] Pain in feet at rest  [] Pain in feet when laying flat   [] History of DVT   [] Phlebitis   [x] Swelling in legs   [] Varicose veins   [] Non-healing ulcers Pulmonary:   [] Uses home oxygen   [] Productive cough   [] Hemoptysis   [] Wheeze  [] COPD   [] Asthma  Neurologic:  [] Dizziness  [] Blackouts   [] Seizures   [] History of stroke   [] History of TIA  [] Aphasia   [] Temporary blindness   [] Dysphagia   [] Weakness or numbness in arms   [x] Weakness or  numbness in legs Musculoskeletal:  [x] Arthritis   [] Joint swelling   [] Joint pain   [] Low back pain Hematologic:  [] Easy bruising  [] Easy bleeding   [] Hypercoagulable state   [] Anemic  [] Hepatitis Gastrointestinal:  [] Blood in stool   [] Vomiting blood  [] Gastroesophageal reflux/heartburn   [] Abdominal pain Genitourinary:  [] Chronic kidney disease   [] Difficult urination  [] Frequent urination  [] Burning with urination   [] Hematuria Skin:  [] Rashes   [] Ulcers   [] Wounds Psychological:  [] History of anxiety   []  History of major depression.  Physical Examination  BP (!) 145/69 (BP Location: Right Arm)   Pulse (!) 53   Resp 15  Gen:  WD/WN, NAD Head: [place]/AT, No temporalis wasting. Ear/Nose/Throat: Hearing grossly intact, nares w/o erythema or drainage Eyes: Conjunctiva clear. Sclera non-icteric Neck: Supple.  Trachea midline Pulmonary:  Good air movement, no use of accessory muscles.  Cardiac: RRR, no JVD Vascular:  Vessel Right Left  Radial Palpable Palpable                          PT Palpable Palpable  DP Palpable Palpable   Gastrointestinal: soft, non-tender/non-distended. No guarding/reflex.  Musculoskeletal: M/S 5/5 throughout.  No deformity or atrophy.  Purplish discoloration of both legs with good capillary refill.  Trace bilateral lower extremity edema. Neurologic: Sensation grossly intact in extremities.  Symmetrical.  Speech is fluent.  Psychiatric: Judgment intact, Mood & affect appropriate for pt's clinical situation. Dermatologic: No rashes or ulcers noted.  No cellulitis or open wounds.      Labs Recent Results (from the past 2160 hour(s))  Aerobic Culture w Gram Stain (superficial specimen)     Status: None   Collection Time: 07/10/20  4:15 PM   Specimen: Head  Result Value Ref Range   Specimen Description      HEAD Performed at [facility], [address] [place], [place] [address]    Special Requests      NONE Performed at [facility], [place], [place] [address]    Gram Stain      FEW WBC PRESENT, PREDOMINANTLY PMN FEW GRAM POSITIVE COCCI IN CLUSTERS IN CHAINS Performed at [facility], [place] [address], [place],  [address]    Culture MODERATE STAPHYLOCOCCUS AUREUS    Report Status 07/13/2020 FINAL    Organism ID, Bacteria STAPHYLOCOCCUS AUREUS       Susceptibility   Staphylococcus aureus - MIC*    CIPROFLOXACIN <=0.5 SENSITIVE Sensitive     ERYTHROMYCIN RESISTANT Resistant     GENTAMICIN <=0.5 SENSITIVE Sensitive     OXACILLIN 0.5 SENSITIVE Sensitive     TETRACYCLINE <=1 SENSITIVE Sensitive     VANCOMYCIN <=0.5 SENSITIVE Sensitive     TRIMETH/SULFA <=10 SENSITIVE Sensitive     CLINDAMYCIN RESISTANT Resistant     RIFAMPIN <=0.5 SENSITIVE Sensitive     Inducible Clindamycin POSITIVE Resistant     * MODERATE STAPHYLOCOCCUS AUREUS  Basic Metabolic Panel     Status: Abnormal   Collection Time: 07/12/20 10:57 AM  Result Value Ref Range   Sodium 140 135 - 145 mEq/L   Potassium 4.4 3.5 - 5.1 mEq/L   Chloride 105 96 - 112 mEq/L   CO2 28  19 - 32 mEq/L   Glucose, Bld 85 70 - 99 mg/dL   BUN 19 6 - 23 mg/dL   Creatinine, Ser 0.98 0.40 - 1.20 mg/dL   GFR 51.32 (L) >60.00 mL/min    Comment: Calculated using the CKD-EPI Creatinine Equation (2021)   Calcium 8.9 8.4 - 10.5 mg/dL    Radiology No results found.  Assessment/Plan Persistent atrial fibrillation Rate controlled on Pacerone   Essential hypertension blood pressure control important in reducing the progression of atherosclerotic disease. On appropriate oral medications.  No problem-specific Assessment & Plan notes found for this encounter.    Leotis Pain, MD  09/29/2020 8:48 AM    This note was created with Dragon medical transcription system.  Any errors from dictation are purely unintentional

## 2020-10-02 ENCOUNTER — Telehealth: Payer: Self-pay | Admitting: *Deleted

## 2020-10-02 DIAGNOSIS — I5032 Chronic diastolic (congestive) heart failure: Secondary | ICD-10-CM | POA: Diagnosis not present

## 2020-10-02 DIAGNOSIS — I4891 Unspecified atrial fibrillation: Secondary | ICD-10-CM | POA: Diagnosis not present

## 2020-10-02 DIAGNOSIS — E7849 Other hyperlipidemia: Secondary | ICD-10-CM | POA: Diagnosis not present

## 2020-10-02 DIAGNOSIS — I2510 Atherosclerotic heart disease of native coronary artery without angina pectoris: Secondary | ICD-10-CM | POA: Diagnosis not present

## 2020-10-02 DIAGNOSIS — K219 Gastro-esophageal reflux disease without esophagitis: Secondary | ICD-10-CM | POA: Diagnosis not present

## 2020-10-02 DIAGNOSIS — I872 Venous insufficiency (chronic) (peripheral): Secondary | ICD-10-CM | POA: Diagnosis not present

## 2020-10-02 DIAGNOSIS — Z7901 Long term (current) use of anticoagulants: Secondary | ICD-10-CM | POA: Diagnosis not present

## 2020-10-02 DIAGNOSIS — Z20822 Contact with and (suspected) exposure to covid-19: Secondary | ICD-10-CM | POA: Diagnosis not present

## 2020-10-02 DIAGNOSIS — Z9181 History of falling: Secondary | ICD-10-CM | POA: Diagnosis not present

## 2020-10-02 DIAGNOSIS — E538 Deficiency of other specified B group vitamins: Secondary | ICD-10-CM | POA: Diagnosis not present

## 2020-10-02 DIAGNOSIS — B342 Coronavirus infection, unspecified: Secondary | ICD-10-CM | POA: Diagnosis not present

## 2020-10-02 DIAGNOSIS — M5136 Other intervertebral disc degeneration, lumbar region: Secondary | ICD-10-CM | POA: Diagnosis not present

## 2020-10-02 DIAGNOSIS — I272 Pulmonary hypertension, unspecified: Secondary | ICD-10-CM | POA: Diagnosis not present

## 2020-10-02 DIAGNOSIS — M810 Age-related osteoporosis without current pathological fracture: Secondary | ICD-10-CM | POA: Diagnosis not present

## 2020-10-02 DIAGNOSIS — G2581 Restless legs syndrome: Secondary | ICD-10-CM | POA: Diagnosis not present

## 2020-10-02 DIAGNOSIS — I130 Hypertensive heart and chronic kidney disease with heart failure and stage 1 through stage 4 chronic kidney disease, or unspecified chronic kidney disease: Secondary | ICD-10-CM | POA: Diagnosis not present

## 2020-10-02 DIAGNOSIS — N1831 Chronic kidney disease, stage 3a: Secondary | ICD-10-CM | POA: Diagnosis not present

## 2020-10-02 DIAGNOSIS — G9009 Other idiopathic peripheral autonomic neuropathy: Secondary | ICD-10-CM | POA: Diagnosis not present

## 2020-10-02 DIAGNOSIS — Z7982 Long term (current) use of aspirin: Secondary | ICD-10-CM | POA: Diagnosis not present

## 2020-10-02 DIAGNOSIS — M1990 Unspecified osteoarthritis, unspecified site: Secondary | ICD-10-CM | POA: Diagnosis not present

## 2020-10-02 NOTE — Telephone Encounter (Signed)
Saralyn Pilar of PCP  msg and answered questions as to how to stay safe with pt being positive for covid. [name] wrote all instructions down. She reports she does not think Eliquis can be split but she will split them if possible. Educated her on which pills can and cannot be split. Advised to f/u with office on Wed, 6/29, but contact office sooner if any symptoms worsen or she develops any new symptoms. Advised of ER precautions. [name] verbalized understanding and was extremely appreciative.

## 2020-10-02 NOTE — Telephone Encounter (Signed)
Noted. Plz call daughter: Agree with holding atorvastatin while on paxlovid.  Also recommend cutting eliquis to 1/2 tab BID if able to cut these pills. If not, that's fine.  Paxlovid also increases amiodarone concentrations - has to be very careful with this combination even if holding amiodarone as it has a long duration of effect - monitor for amiodarone toxicity of lethargy, swelling of hands/feet, weight loss, shortness of breath. She was on low enough dose of amiodarone that should be ok to just hold med.   Plz also call Wed for update on symptoms.

## 2020-10-02 NOTE — Telephone Encounter (Signed)
Patient's daughter [name] left a voicemail stating that her mom tested positive for covid this morning. [name] stated that they did a home test and then she took her to an UC in [place] close to her home. [name] stated that they did a rapid test that was positive and they also sent a test off. Patient's daughter stated that patient was seen by a nurse with [facility] this morning and her lungs were clear. [name] stated that her mom was prescribed Paxlovid 20 X150X 10 X 100 mg and was told to start immediately. [name] stated that they were told to stop her mom's atorvastatin and amiodarone while on the Paxlovid. Patient stated that her mom does not have a fever but does have a cough. [name] wants to make sure that Dr. Danise Mina is okay with all of this.

## 2020-10-03 ENCOUNTER — Ambulatory Visit: Payer: Medicare Other | Admitting: Physician Assistant

## 2020-10-05 NOTE — Telephone Encounter (Signed)
[name] called [place] at 4:45 with  update on pt condition after pt testing positive for covid earlier in the wk;  [name] was supposed to call on 10/04/20 but pt's daughter forgot. [name] said that pt is doing OK; pt started paxlovid on 10/02/20 in the evening; on Mon - Wed pt did not have a lot of complaints on 10/05/20 this morning pt was saying she was tired and sleepy with slight h/a and body aches but [name] said after pt took her meds and ate something pt was doing OK before she left pt this morning.  [name] has not spoken with pt any more today but patricia is going to call pt shortly and see how she is doing.  [name] also wants to know if paxlovid can cause nausea. Pt has complained of some nausea but no vomiting or diarrhea. [name] asked for cb on 10/06/20 and she can give further update on pt before the holiday weekend and also wants answer if paxlovid can cause nausea. [name] is aware Dr Darnell Level will be in office on 10/06/20 and will wait for cb then.

## 2020-10-06 ENCOUNTER — Telehealth: Payer: Self-pay

## 2020-10-06 NOTE — Telephone Encounter (Signed)
Spoke with pt's daughter, [name] (on dpr) relaying Dr. Synthia Innocent message.  Verbalizes understanding.

## 2020-10-06 NOTE — Telephone Encounter (Signed)
Yes paxlovid can cause nausea, diarrhea, metallic/bad test - are most common side effects. Should improve once done with course

## 2020-10-06 NOTE — Telephone Encounter (Signed)
Lauren workmen nurse with [facility] left v/m that appears the arterial sore on rt foot is reoccuring. Pt is also covid +. Pt has upcoming appt with wound carebut due to pt having covid will be awhile before pt can be seen in wound care office. Lauren is faxing and order to resume care twice a wk and will apply ointment along with foam dressing as previously ordered from wound care center(cannot understand name of ointment but will be on faxed order and I tried calling lauren to verify name of ointment but no answer.) sending note to Dr Darnell Level as Juluis Rainier.

## 2020-10-07 NOTE — Telephone Encounter (Signed)
Thank you will sign order when it arrives.

## 2020-10-09 DIAGNOSIS — M1990 Unspecified osteoarthritis, unspecified site: Secondary | ICD-10-CM | POA: Diagnosis not present

## 2020-10-09 DIAGNOSIS — I272 Pulmonary hypertension, unspecified: Secondary | ICD-10-CM | POA: Diagnosis not present

## 2020-10-09 DIAGNOSIS — Z7901 Long term (current) use of anticoagulants: Secondary | ICD-10-CM | POA: Diagnosis not present

## 2020-10-09 DIAGNOSIS — G2581 Restless legs syndrome: Secondary | ICD-10-CM | POA: Diagnosis not present

## 2020-10-09 DIAGNOSIS — I5032 Chronic diastolic (congestive) heart failure: Secondary | ICD-10-CM | POA: Diagnosis not present

## 2020-10-09 DIAGNOSIS — I2510 Atherosclerotic heart disease of native coronary artery without angina pectoris: Secondary | ICD-10-CM | POA: Diagnosis not present

## 2020-10-09 DIAGNOSIS — I130 Hypertensive heart and chronic kidney disease with heart failure and stage 1 through stage 4 chronic kidney disease, or unspecified chronic kidney disease: Secondary | ICD-10-CM | POA: Diagnosis not present

## 2020-10-09 DIAGNOSIS — Z7982 Long term (current) use of aspirin: Secondary | ICD-10-CM | POA: Diagnosis not present

## 2020-10-09 DIAGNOSIS — G9009 Other idiopathic peripheral autonomic neuropathy: Secondary | ICD-10-CM | POA: Diagnosis not present

## 2020-10-09 DIAGNOSIS — E7849 Other hyperlipidemia: Secondary | ICD-10-CM | POA: Diagnosis not present

## 2020-10-09 DIAGNOSIS — K219 Gastro-esophageal reflux disease without esophagitis: Secondary | ICD-10-CM | POA: Diagnosis not present

## 2020-10-09 DIAGNOSIS — I872 Venous insufficiency (chronic) (peripheral): Secondary | ICD-10-CM | POA: Diagnosis not present

## 2020-10-09 DIAGNOSIS — M810 Age-related osteoporosis without current pathological fracture: Secondary | ICD-10-CM | POA: Diagnosis not present

## 2020-10-09 DIAGNOSIS — M5136 Other intervertebral disc degeneration, lumbar region: Secondary | ICD-10-CM | POA: Diagnosis not present

## 2020-10-09 DIAGNOSIS — N1831 Chronic kidney disease, stage 3a: Secondary | ICD-10-CM | POA: Diagnosis not present

## 2020-10-09 DIAGNOSIS — I4891 Unspecified atrial fibrillation: Secondary | ICD-10-CM | POA: Diagnosis not present

## 2020-10-09 DIAGNOSIS — Z9181 History of falling: Secondary | ICD-10-CM | POA: Diagnosis not present

## 2020-10-09 DIAGNOSIS — E538 Deficiency of other specified B group vitamins: Secondary | ICD-10-CM | POA: Diagnosis not present

## 2020-10-10 ENCOUNTER — Other Ambulatory Visit: Payer: Self-pay

## 2020-10-10 ENCOUNTER — Encounter: Payer: Self-pay | Admitting: Internal Medicine

## 2020-10-10 ENCOUNTER — Ambulatory Visit (INDEPENDENT_AMBULATORY_CARE_PROVIDER_SITE_OTHER): Payer: Medicare Other | Admitting: Internal Medicine

## 2020-10-10 ENCOUNTER — Telehealth: Payer: Self-pay

## 2020-10-10 DIAGNOSIS — S51011A Laceration without foreign body of right elbow, initial encounter: Secondary | ICD-10-CM | POA: Diagnosis not present

## 2020-10-10 DIAGNOSIS — S51019A Laceration without foreign body of unspecified elbow, initial encounter: Secondary | ICD-10-CM | POA: Insufficient documentation

## 2020-10-10 DIAGNOSIS — Z8739 Personal history of other diseases of the musculoskeletal system and connective tissue: Secondary | ICD-10-CM | POA: Insufficient documentation

## 2020-10-10 DIAGNOSIS — M869 Osteomyelitis, unspecified: Secondary | ICD-10-CM

## 2020-10-10 MED ORDER — SILVER SULFADIAZINE 1 % EX CREA: 1.0000 "application " | TOPICAL_CREAM | Freq: Every day | CUTANEOUS | 0 refills | Status: DC

## 2020-10-10 MED ORDER — DOXYCYCLINE HYCLATE 100 MG PO TABS: 100.0000 mg | ORAL_TABLET | Freq: Two times a day (BID) | ORAL | 0 refills | Status: DC

## 2020-10-10 NOTE — Telephone Encounter (Signed)
Noted. Thank you to Dr Silvio Pate for seeing patient.

## 2020-10-10 NOTE — Telephone Encounter (Signed)
Noted I will see her later this morning

## 2020-10-10 NOTE — Progress Notes (Signed)
Subjective:    Patient ID: [name], female    DOB: March 11, 1930, 87 y.o.   MRN: [id number]  HPI Here with daughter [name] after a fall yesterday This visit occurred during the SARS-CoV-2 public health emergency.  Safety protocols were in place, including screening questions prior to the visit, additional usage of staff PPE, and extensive cleaning of exam room while observing appropriate contact time as indicated for disinfecting solutions.   Has had sore on right foot laterally for 2 years or so Treated with wound care after infection, etc Finally seemed healed up (pronounced clear about a month ago)  Now it is starting to act up again More pain now and redness  Now with having trouble even putting weight on it Walks with rollator  Got up yesterday to answer door (for bath aide) Backed up and right foot just "gave out) Hit right elbow, right upper calf and right shoulder pain Daughter cleaned abraded area and put on neosporin  Current Outpatient Medications on File Prior to Visit  Medication Sig Dispense Refill   acetaminophen (TYLENOL) 500 MG tablet Take 1,000 mg every 8 (eight) hours as needed by mouth for moderate pain.      acyclovir (ZOVIRAX) 400 MG tablet Take 400 mg by mouth 2 (two) times daily.     Alpha-Lipoic Acid 600 MG TABS Take by mouth.     amiodarone (PACERONE) 200 MG tablet TAKE 1/2 TABLET (100MG ) BY MOUTH ONCE DAILY 15 tablet 3   aspirin EC 81 MG tablet Take 1 tablet (81 mg total) by mouth daily. 150 tablet 2   atorvastatin (LIPITOR) 20 MG tablet TAKE 1 TABLET BY MOUTH ONCE DAILY 90 tablet 1   ciclopirox (LOPROX) 0.77 % cream Apply topically 2 (two) times daily. Apply to entire foot including between toes of both feet 30 g 4   DULoxetine (CYMBALTA) 60 MG capsule TAKE 1 CAPSULE BY MOUTH ONCE DAILY 90 capsule 1   ELIQUIS 2.5 MG TABS tablet TAKE 1 TABLET BY MOUTH TWICE DAILY 180 tablet 1   gabapentin (NEURONTIN) 400 MG capsule Take 1 capsule (400 mg total) by  mouth at bedtime. With second dose during day as needed 150 capsule 1   lisinopril (ZESTRIL) 10 MG tablet Take 1 tablet (10 mg total) by mouth daily. 30 tablet 6   memantine (NAMENDA) 5 MG tablet TAKE 1 TABLET BY MOUTH TWICE DAILY 60 tablet 6   metoprolol tartrate (LOPRESSOR) 25 MG tablet TAKE 1/2 TABLET BY MOUTH TWICE DAILY 90 tablet 3   pantoprazole (PROTONIX) 40 MG tablet TAKE 1 TABLET BY MOUTH EVERY DAY 90 tablet 1   polyethylene glycol (MIRALAX / GLYCOLAX) 17 g packet Take 17 g by mouth daily as needed for mild constipation. 14 each 0   prednisoLONE acetate (PRED FORTE) 1 % ophthalmic suspension Place 1 drop into the left eye daily.      rOPINIRole (REQUIP) 1 MG tablet Take 2.5 tablets (2.5 mg total) by mouth at bedtime. 225 tablet 1   senna (SENOKOT) 8.6 MG TABS tablet Take 1 tablet (8.6 mg total) by mouth daily as needed for mild constipation.     tiZANidine (ZANAFLEX) 2 MG tablet Take by mouth daily.     traMADol (ULTRAM) 50 MG tablet Take 50 mg by mouth daily. Takes 2 tablets daily  0   Vitamin D, Ergocalciferol, (DRISDOL) 1.25 MG (50000 UNIT) CAPS capsule TAKE 1 CAPSULE BY MOUTH ONCE WEEKLY 12 capsule 3   No current facility-administered medications on  file prior to visit.    Allergies  Allergen Reactions   Penicillins Rash and Other (See Comments)    Childhood Allergy Has patient had a PCN reaction causing immediate rash, facial/tongue/throat swelling, SOB or lightheadedness with hypotension: Yes Has patient had a PCN reaction causing severe rash involving mucus membranes or skin necrosis: Unknown Has patient had a PCN reaction that required hospitalization: No Has patient had a PCN reaction occurring within the last 10 years: No If all of the above answers are "NO", then may proceed with Cephalosporin use.     Past Medical History:  Diagnosis Date   Abdominal aortic atherosclerosis ([place]) 09/2015   by xray   Anxiety    BCC (basal cell carcinoma of skin) 05/2011   on nose    BCC (basal cell carcinoma of skin) 08/09/2020   upper chest   Breast lesion    a. diagnosed with complex sclerosing lesion with calcifications of the right breast in 08/2016   Chest pain    a. nuclear stress test 03/2012: normal; b. 09/2018 MV: EF >65%, no isch/scar.    DDD (degenerative disc disease), lumbosacral 12/06/1992   Gallstones 09/2015   incidentally by xray   GERD (gastroesophageal reflux disease) 05/1999   HERPES ZOSTER 04/13/2007   Qualifier: Diagnosis of  By: [name], [name]    Hiatal hernia    History of shingles    ophthalmic, takes acyclovir daily   Hyperlipidemia 12/2003   Hypertension 1990   Mitral regurgitation    a. echo 03/2012: EF 60-65%, no RWMA, mild to mod AI/MR, mod TR, PASP 37 mmHg   Osteoarthritis 08/21/1989   Osteoporosis with fracture 11/1997   with compression fracture T12   Persistent atrial fibrillation ([place]) 03/16/2012   a. CHADS2VASc => 5 (HTN, age x 2, vascular disease, sex category)-->Eliquis 5 BID;  b. Recurrent AF in 06/2016;  c. 01/2017 s/p DCCV;  d. 02/2017 recurrent AFib->Amio added->DCCV; e. 03/2017 recurrent AFib, s/p reload of amio and DCCV 04/02/2017   POSTHERPETIC NEURALGIA 04/20/2007   Qualifier: Diagnosis of  By: [name], Billie-Lynn Daniels    Rheumatoid arthritis ([place])    RLS (restless legs syndrome)     Past Surgical History:  Procedure Laterality Date   ABDOMINAL HYSTERECTOMY  Age 82 - 60   S/P TAH   abdominal ultrasound  04/23/2004   Gallstones   BREAST EXCISIONAL BIOPSY Right 2018   neg/benign complex sclerosing lesion   BREAST LUMPECTOMY WITH RADIOACTIVE SEED LOCALIZATION Right 10/2016   breast lumpectomy with radioactive seed localization and margin assessment for complex sclerosing lesion ([place])   BREAST LUMPECTOMY WITH RADIOACTIVE SEED LOCALIZATION Right 11/05/2016   Procedure: RIGHT BREAST LUMPECTOMY WITH RADIOACTIVE SEED LOCALIZATION;  Surgeon: Fanny Skates, MD;  Location: [facility];   Service: General;  Laterality: Right;   CARDIAC CATHETERIZATION     CARDIOVERSION N/A 01/31/2017   Procedure: CARDIOVERSION;  Surgeon: Wellington Hampshire, MD;  Location: [facility];  Service: Cardiovascular;  Laterality: N/A;   CARDIOVERSION N/A 03/03/2017   Procedure: CARDIOVERSION;  Surgeon: Wellington Hampshire, MD;  Location: [facility];  Service: Cardiovascular;  Laterality: N/A;   CARDIOVERSION N/A 04/02/2017   Procedure: CARDIOVERSION;  Surgeon: Wellington Hampshire, MD;  Location: [facility];  Service: Cardiovascular;  Laterality: N/A;   CARDIOVERSION N/A 01/16/2018   Procedure: CARDIOVERSION;  Surgeon: Wellington Hampshire, MD;  Location: [facility];  Service: Cardiovascular;  Laterality: N/A;   CATARACT EXTRACTION  05/2010   left eye  CERVICAL DISCECTOMY  1992   Fusion (Dr. Joya Salm)   ESI Bilateral 01/2016   S1 transforaminal ESI x3   ESOPHAGOGASTRODUODENOSCOPY  01/01/2002   with ulcer, bx. neg; + stricture gastric ulcer with hemorrhage   ESOPHAGOGASTRODUODENOSCOPY  04/23/2004   Esophageal stricture -- dilated   INTRAVASCULAR PRESSURE WIRE/FFR STUDY N/A 07/12/2019   Procedure: INTRAVASCULAR PRESSURE WIRE/FFR STUDY;  Surgeon: Wellington Hampshire, MD;  Location: [place] [facility];  Service: Cardiovascular;  Laterality: N/A;   LOWER EXTREMITY ANGIOGRAPHY Left 02/01/2019   LOWER EXTREMITY ANGIOGRAPHY;  Surgeon: Algernon Huxley, MD   LOWER EXTREMITY ANGIOGRAPHY Right 03/25/2019   Procedure: LOWER EXTREMITY ANGIOGRAPHY;  Surgeon: Algernon Huxley, MD;  Location: [place] [facility];  Service: Cardiovascular;  Laterality: Right;   LOWER EXTREMITY ANGIOGRAPHY Right 08/02/2019   Procedure: LOWER EXTREMITY ANGIOGRAPHY;  Surgeon: Algernon Huxley, MD;  Location: [place] [facility];  Service: Cardiovascular;  Laterality: Right;   nuclear stress test  03/2012   no ischemia   PERIPHERAL VASCULAR BALLOON ANGIOPLASTY Left 01/2019   Percutaneous transluminal angioplasty of left anterior and posterior tibial artery with  2.5 mm diameter by 22 cm length angioplasty balloon ([place])   RIGHT/LEFT HEART CATH AND CORONARY ANGIOGRAPHY Bilateral 07/12/2019   Procedure: RIGHT/LEFT HEART CATH AND CORONARY ANGIOGRAPHY;  Surgeon: Wellington Hampshire, MD;  Location: [facility];  Service: Cardiovascular;  Laterality: Bilateral;   SKIN CANCER EXCISION  05/20/2011   BCC from nose   US ECHOCARDIOGRAPHY  03/2012   in flutter, EF 60%, mild-mod aortic, mitral, tricuspid regurg, mildly dilated LA    Family History  Problem Relation Age of Onset   Emphysema Mother    Heart disease Father        MI   Stroke Father        first at age 29.   Stroke Sister    Diabetes Sister    Hypertension Brother    Heart disease Brother        Heart Valve   Stroke Brother    Diabetes Brother    Cancer Brother        esophageal   Diabetes Brother    Diabetes Brother    Colon cancer Neg Hx     Social History   Socioeconomic History   Marital status: Widowed    Spouse name: Not on file   Number of children: 5   Years of education: Not on file   Highest education level: Not on file  Occupational History   Occupation: Retired - Occupational psychologist: RETIRED  Tobacco Use   Smoking status: Former    Packs/day: 0.25    Years: 1.00    Pack years: 0.25    Types: Cigarettes    Quit date: 04/08/1976    Years since quitting: 44.5   Smokeless tobacco: Never   Tobacco comments:    pt smokes occasionally "socially"  Vaping Use   Vaping Use: Never used  Substance and Sexual Activity   Alcohol use: No    Alcohol/week: 1.0 standard drink    Types: 1 Glasses of wine per week   Drug use: No   Sexual activity: Never  Other Topics Concern   Not on file  Social History Narrative   Lives with grandson who smokes, dog   From [place].  Widowed 01/1999; 1st marriage 25 years / 2nd marriage 13 years.   One son deceased 01/25/2023.   Activity: active with dog   Diet: good  water, fruits/vegetables daily   Social Determinants of Health    Financial Resource Strain: Not on file  Food Insecurity: Not on file  Transportation Needs: Not on file  Physical Activity: Not on file  Stress: Not on file  Social Connections: Not on file  Intimate Partner Violence: Not on file   Review of Systems No fever--other than in the foot No N/V Eating okay     Objective:   Physical Exam Constitutional:      Appearance: Normal appearance.  Musculoskeletal:     Comments: Granulated ulcer along lateral 5th MTP Marked redness, warmth and significant tenderness go across forefoot, into toe and proximally up foot Any movement of 5th toe causes pain  Skin:    Comments: Skin tears just below right knee and over right elbow No infection  Neurological:     Mental Status: She is alert.           Assessment & Plan:

## 2020-10-10 NOTE — Assessment & Plan Note (Signed)
Findings consistent with recurrence of osteo Culture showed S. aureus and she did well with doxy before Will restart doxy--plan at least 6 weeks----but may not be able to stop at all

## 2020-10-10 NOTE — Telephone Encounter (Signed)
Received call from daughter. States mom had fall 10/09/2020. Did not hit head or loose consciousness. Has scrape on elbow and leg. She is having trouble bearing weight on rt foot as well. Have put on schedule for Dr. Silvio Pate today but patient wanted to make sure I informed Dr. Darnell Level as well.

## 2020-10-10 NOTE — Assessment & Plan Note (Signed)
Also upper right calf (tibial plateau) Both areas covered with silvadene and DSD Discussed wound care with daughter

## 2020-10-11 ENCOUNTER — Ambulatory Visit (INDEPENDENT_AMBULATORY_CARE_PROVIDER_SITE_OTHER): Payer: Medicare Other | Admitting: Family Medicine

## 2020-10-11 ENCOUNTER — Ambulatory Visit
Admission: RE | Admit: 2020-10-11 | Discharge: 2020-10-11 | Disposition: A | Discharge status: Home / Self Care | Payer: Medicare Other | Source: Ambulatory Visit | Attending: Family Medicine | Admitting: Family Medicine

## 2020-10-11 ENCOUNTER — Ambulatory Visit
Admission: RE | Admit: 2020-10-11 | Discharge: 2020-10-11 | Disposition: A | Discharge status: Home / Self Care | Payer: Medicare Other | Attending: Family Medicine | Admitting: Family Medicine

## 2020-10-11 ENCOUNTER — Encounter: Payer: Self-pay | Admitting: Family Medicine

## 2020-10-11 VITALS — BP 140/60 | HR 70 | Temp 97.4°F | Ht 61.5 in

## 2020-10-11 DIAGNOSIS — D509 Iron deficiency anemia, unspecified: Secondary | ICD-10-CM

## 2020-10-11 DIAGNOSIS — U071 COVID-19: Secondary | ICD-10-CM

## 2020-10-11 DIAGNOSIS — G2581 Restless legs syndrome: Secondary | ICD-10-CM

## 2020-10-11 DIAGNOSIS — E538 Deficiency of other specified B group vitamins: Secondary | ICD-10-CM | POA: Diagnosis not present

## 2020-10-11 DIAGNOSIS — M869 Osteomyelitis, unspecified: Secondary | ICD-10-CM

## 2020-10-11 DIAGNOSIS — Z87828 Personal history of other (healed) physical injury and trauma: Secondary | ICD-10-CM | POA: Diagnosis not present

## 2020-10-11 DIAGNOSIS — M79671 Pain in right foot: Secondary | ICD-10-CM | POA: Diagnosis not present

## 2020-10-11 DIAGNOSIS — S51011A Laceration without foreign body of right elbow, initial encounter: Secondary | ICD-10-CM | POA: Diagnosis not present

## 2020-10-11 DIAGNOSIS — M7989 Other specified soft tissue disorders: Secondary | ICD-10-CM | POA: Diagnosis not present

## 2020-10-11 MED ORDER — HYDROCODONE-ACETAMINOPHEN 5-325 MG PO TABS: 1.0000 | ORAL_TABLET | Freq: Three times a day (TID) | ORAL | 0 refills | Status: DC | PRN

## 2020-10-11 MED ORDER — CYANOCOBALAMIN 1000 MCG/ML IJ SOLN
1000.0000 ug | Freq: Once | INTRAMUSCULAR | Status: AC
  Administered 2020-10-11: 1000 ug via INTRAMUSCULAR

## 2020-10-11 NOTE — Patient Instructions (Addendum)
Labs today  Go to [facility] for [facility] of right foot.  Continue doxycycline  May take tylenol 500mg  three times daily with meals.  Try hydrocodone 5/325mg  up to three times a day as needed for breakthrough pain Follow up with wound clinic.

## 2020-10-11 NOTE — Progress Notes (Signed)
Patient ID: ELPIDIA KARN, female    DOB: 1930-03-26, 87 y.o.   MRN: [id number]  This visit was conducted in person.  BP 140/60   Pulse 70   Temp (!) 97.4 F (36.3 C) (Temporal)   Ht 5' 1.5" (1.562 m)   SpO2 95%   BMI 24.54 kg/m    CC: 4 mo f/u visit  Subjective:   HPI: BELVIA GOTSCHALL is a 87 y.o. female presenting on 10/11/2020 for Follow-up (Here for 4 mo f/u.  Pt accompanied by daughter, [name]. 97.6.)   Saw Dr Silvio Pate yesterday for recurrent fall with R elbow and R upper shin abrasions as well as hitting R calf and R shoulder. Treating with silvadene cream.   Also concern for recurrent R lateral foot sore - and recurrence of osteomyelitis. Started on doxycycline course x at least 6 weeks. This started several weeks ago. Pending wound clinic eval - postponed due to recent COVID infection. Tramadol through Dr Sharlet Salina [facility].   No fevers/chills, no drainage from wound, no abd pain.   Recent COVID tested 10/02/2020, symptoms started 6/27. Seen at [facility], treated with Paxlovid - completed course.   Last saw [facility] 09/29/2020 with good report at that time.  Last wound clinic appointment 09/05/2020 - at that time released due to good progress and wound finally closing.   Recent iron infusion x2 (06/2020, 07/2020) - with significant benefit in RLS symptoms.      Relevant past medical, surgical, family and social history reviewed and updated as indicated. Interim medical history since our last visit reviewed. Allergies and medications reviewed and updated. Outpatient Medications Prior to Visit  Medication Sig Dispense Refill   acetaminophen (TYLENOL) 500 MG tablet Take 1,000 mg every 8 (eight) hours as needed by mouth for moderate pain.      acyclovir (ZOVIRAX) 400 MG tablet Take 400 mg by mouth 2 (two) times daily.     Alpha-Lipoic Acid 600 MG TABS Take by mouth.     amiodarone (PACERONE) 200 MG tablet TAKE 1/2 TABLET (100MG) BY MOUTH ONCE DAILY 15 tablet 3   aspirin EC 81 MG  tablet Take 1 tablet (81 mg total) by mouth daily. 150 tablet 2   atorvastatin (LIPITOR) 20 MG tablet TAKE 1 TABLET BY MOUTH ONCE DAILY 90 tablet 1   ciclopirox (LOPROX) 0.77 % cream Apply topically 2 (two) times daily. Apply to entire foot including between toes of both feet 30 g 4   doxycycline (VIBRA-TABS) 100 MG tablet Take 1 tablet (100 mg total) by mouth 2 (two) times daily. 84 tablet 0   DULoxetine (CYMBALTA) 60 MG capsule TAKE 1 CAPSULE BY MOUTH ONCE DAILY 90 capsule 1   ELIQUIS 2.5 MG TABS tablet TAKE 1 TABLET BY MOUTH TWICE DAILY 180 tablet 1   gabapentin (NEURONTIN) 400 MG capsule Take 1 capsule (400 mg total) by mouth at bedtime. With second dose during day as needed 150 capsule 1   lisinopril (ZESTRIL) 10 MG tablet Take 1 tablet (10 mg total) by mouth daily. 30 tablet 6   memantine (NAMENDA) 5 MG tablet TAKE 1 TABLET BY MOUTH TWICE DAILY 60 tablet 6   metoprolol tartrate (LOPRESSOR) 25 MG tablet TAKE 1/2 TABLET BY MOUTH TWICE DAILY 90 tablet 3   pantoprazole (PROTONIX) 40 MG tablet TAKE 1 TABLET BY MOUTH EVERY DAY 90 tablet 1   polyethylene glycol (MIRALAX / GLYCOLAX) 17 g packet Take 17 g by mouth daily as needed for mild constipation. [place]  each 0   prednisoLONE acetate (PRED FORTE) 1 % ophthalmic suspension Place 1 drop into the left eye daily.      rOPINIRole (REQUIP) 1 MG tablet Take 2.5 tablets (2.5 mg total) by mouth at bedtime. 225 tablet 1   silver sulfADIAZINE (SILVADENE) 1 % cream Apply 1 application topically daily. 50 g 0   tiZANidine (ZANAFLEX) 2 MG tablet Take by mouth daily.     traMADol (ULTRAM) 50 MG tablet Take 50 mg by mouth daily. Takes 2 tablets daily  0   Vitamin D, Ergocalciferol, (DRISDOL) 1.25 MG (50000 UNIT) CAPS capsule TAKE 1 CAPSULE BY MOUTH ONCE WEEKLY 12 capsule 3   senna (SENOKOT) 8.6 MG TABS tablet Take 1 tablet (8.6 mg total) by mouth daily as needed for mild constipation.     No facility-administered medications prior to visit.     Per HPI unless  specifically indicated in ROS section below Review of Systems  Objective:  BP 140/60   Pulse 70   Temp (!) 97.4 F (36.3 C) (Temporal)   Ht 5' 1.5" (1.562 m)   SpO2 95%   BMI 24.54 kg/m   Wt Readings from Last 3 Encounters:  09/21/20 132 lb (59.9 kg)  08/03/20 128 lb 6.4 oz (58.2 kg)  06/21/20 126 lb 8 oz (57.4 kg)      Physical Exam Vitals and nursing note reviewed.  Constitutional:      Appearance: Normal appearance. She is ill-appearing (chronic).     Comments: Sitting in transport chair, unable to ambulate due to R foot pain  Cardiovascular:     Rate and Rhythm: Normal rate and regular rhythm.     Pulses: Normal pulses.     Heart sounds: Normal heart sounds. No murmur heard. Pulmonary:     Effort: Pulmonary effort is normal. No respiratory distress.     Breath sounds: Normal breath sounds. No wheezing, rhonchi or rales.  Musculoskeletal:     Right lower leg: No edema.     Left lower leg: No edema.     Comments:  1+ DP bilaterally Purplish hue to left foot R foot with scabbed sore lateral to 5th MTPJ with surrounding erythema/warmth and marked tenderness to palpation throughout foot, unable to bear weight due to this Chronic callus to base of 1st MTPJ   Skin:    Comments: Abrasions to R elbow and R lower leg distal to knee with dressings/coban c/d/i  Neurological:     Mental Status: She is alert.      Results for orders placed or performed in visit on 88/11/03  Basic Metabolic Panel  Result Value Ref Range   Sodium 140 135 - 145 mEq/L   Potassium 4.4 3.5 - 5.1 mEq/L   Chloride 105 96 - 112 mEq/L   CO2 28 19 - 32 mEq/L   Glucose, Bld 85 70 - 99 mg/dL   BUN 19 6 - 23 mg/dL   Creatinine, Ser 0.98 0.40 - 1.20 mg/dL   GFR 51.32 (L) >60.00 mL/min   Calcium 8.9 8.4 - 10.5 mg/dL   VAS [place] ABI WITH/WO TBI  LOWER EXTREMITY DOPPLER STUDY  Patient Name:  GISELL BUEHRLE  Date of Exam:   09/29/2020 Medical Rec #: [id number]        Accession #:    [id number] Date of  Birth: Aug 21, 1929        Patient Gender: F Patient Age:   89Y Exam Location:  [facility] Procedure:  VAS [place] ABI WITH/WO TBI Referring Phys: [id number] JASON S DEW  --------------------------------------------------------------------------------   Indications: Peripheral artery disease.   Vascular Interventions: 08/02/19 rt PTA of calf vessels.  Performing Technologist: Concha Norway RVT    Examination Guidelines: A complete evaluation includes at minimum, Doppler waveform signals and systolic blood pressure reading at the level of bilateral brachial, anterior tibial, and posterior tibial arteries, when vessel segments are accessible. Bilateral testing is considered an integral part of a complete examination. Photoelectric Plethysmograph (PPG) waveforms and toe systolic pressure readings are included as required and additional duplex testing as needed. Limited examinations for reoccurring indications may be performed as noted.    ABI Findings: +---------+------------------+-----+--------+--------+ Right    Rt Pressure (mmHg)IndexWaveformComment  +---------+------------------+-----+--------+--------+ Brachial 155                                     +---------+------------------+-----+--------+--------+ ATA      169               1.09 biphasic         +---------+------------------+-----+--------+--------+ PTA      179               1.15 biphasic         +---------+------------------+-----+--------+--------+ Great Toe103               0.66 Abnormal         +---------+------------------+-----+--------+--------+  +---------+------------------+-----+--------+-------+ Left     Lt Pressure (mmHg)IndexWaveformComment +---------+------------------+-----+--------+-------+ ATA      159               1.03 biphasic        +---------+------------------+-----+--------+-------+ PTA      160               1.03 biphasic         +---------+------------------+-----+--------+-------+ Great Toe91                0.59 Abnormal        +---------+------------------+-----+--------+-------+  +-------+-----------+-----------+------------+------------+ ABI/TBIToday's ABIToday's TBIPrevious ABIPrevious TBI +-------+-----------+-----------+------------+------------+ Right  1.15       .66        1.08        .60          +-------+-----------+-----------+------------+------------+ Left   1.03       .59        1.11        .65          +-------+-----------+-----------+------------+------------+  Compared to prior study on 05/2020.   Summary: Right: Resting right ankle-brachial index is within normal range. No evidence of significant right lower extremity arterial disease. The right toe-brachial index is abnormal.  Left: Resting left ankle-brachial index is within normal range. No evidence of significant left lower extremity arterial disease. The left toe-brachial index is abnormal.    *See table(s) above for measurements and observations.    Electronically signed by Leotis Pain MD on 09/29/2020 at 11:45:10 AM.       Final     Assessment & Plan:  This visit occurred during the SARS-CoV-2 public health emergency.  Safety protocols were in place, including screening questions prior to the visit, additional usage of staff PPE, and extensive cleaning of exam room while observing appropriate contact time as indicated for disinfecting solutions.   Problem List Items Addressed This Visit     RLS (restless legs syndrome)    Recent iron  infusion x2 (07/2020) have helped RLS symptoms.  She continues requip 2.47m nightly - consider tapering dose.        Vitamin B12 deficiency    She has received her monthly B12 shot today        IDA (iron deficiency anemia)    Update iron panel.  Iron infusion helped RLS        Relevant Orders   IBC panel   Ferritin   Osteomyelitis of right foot ([facility] - Primary     Concern for recurrence of R foot osteo - check labs today (CBC, CMP, ESR, CRP). [facility]  Continue doxycycline at this time - previous wound culture 07/2020 grew MSSA responsive to doxy. Dry wound today - nothing to culture. Will try to re-establish with wound clinic. [facility] wound with ointment and foam dressing. Tramadol ineffective for pain control - will trial hydrocodone - reviewed sedation precautions. Update with effect.         Relevant Orders   Sedimentation rate   C-reactive protein   Comprehensive metabolic panel   CBC with Differential/Platelet   DG Foot Complete Right   Skin tear of elbow without complication    And R lower leg  Not evaluated today  Discussed ongoing home wound care        COVID-19 virus infection    Today is day 9 of symptoms. [facility] Seems to be recovering well from recent COVID illness.  R foot findings not consistent with COVID toes although recent COVID illness may be contributing to acute worsening.          Meds ordered this encounter  Medications   cyanocobalamin ((VITAMIN B-12)) injection 1,000 mcg   HYDROcodone-acetaminophen (NORCO/VICODIN) 5-325 MG tablet    Sig: Take 1 tablet by mouth 3 (three) times daily as needed for moderate pain (sedation precautions).    Dispense:  15 tablet    Refill:  0   Orders Placed This Encounter  Procedures   DG Foot Complete Right    Standing Status:   Future    Number of Occurrences:   1    Standing Expiration Date:   10/11/2021    Order Specific Question:   Reason for Exam (SYMPTOM  OR DIAGNOSIS REQUIRED)    Answer:   R foot lateral wound eval for osteomyelitis    Order Specific Question:   Preferred imaging location?    Answer:   [facility]  Sedimentation rate   C-reactive protein   Comprehensive metabolic panel   CBC with Differential/Platelet   IBC panel    Ferritin    Patient Instructions  Labs today  [facility] to [facility] of right foot.  Continue doxycycline  May take tylenol 5046mthree times daily with meals.  Try hydrocodone 5/32523mp to three times a day as needed for breakthrough pain Follow up with wound clinic.   Follow up plan: Return if symptoms worsen or fail to improve.  JavRia BushD

## 2020-10-12 DIAGNOSIS — U071 COVID-19: Secondary | ICD-10-CM | POA: Insufficient documentation

## 2020-10-12 HISTORY — DX: COVID-19: U07.1

## 2020-10-12 LAB — COMPREHENSIVE METABOLIC PANEL
ALT: 14 U/L (ref 0–35)
AST: 13 U/L (ref 0–37)
Albumin: 3.7 g/dL (ref 3.5–5.2)
Alkaline Phosphatase: 51 U/L (ref 39–117)
BUN: 19 mg/dL (ref 6–23)
CO2: 30 mEq/L (ref 19–32)
Calcium: 9.4 mg/dL (ref 8.4–10.5)
Chloride: 107 mEq/L (ref 96–112)
Creatinine, Ser: 0.78 mg/dL (ref 0.40–1.20)
GFR: 67.37 mL/min (ref >60.00)
Glucose, Bld: 92 mg/dL (ref 70–99)
Potassium: 4.7 mEq/L (ref 3.5–5.1)
Sodium: 145 mEq/L (ref 135–145)
Total Bilirubin: 0.4 mg/dL (ref 0.2–1.2)
Total Protein: 6.2 g/dL (ref 6.0–8.3)

## 2020-10-12 LAB — SEDIMENTATION RATE: Sed Rate: 26 mm/hr (ref 0–30)

## 2020-10-12 LAB — CBC WITH DIFFERENTIAL/PLATELET
Basophils Absolute: 0.1 10*3/uL (ref 0.0–0.1)
Basophils Relative: 0.6 % (ref 0.0–3.0)
Eosinophils Absolute: 0.1 10*3/uL (ref 0.0–0.7)
Eosinophils Relative: 0.6 % (ref 0.0–5.0)
HCT: 35.4 % — ABNORMAL LOW (ref 36.0–46.0)
Hemoglobin: 12 g/dL (ref 12.0–15.0)
Lymphocytes Relative: 8.9 % — ABNORMAL LOW (ref 12.0–46.0)
Lymphs Abs: 0.8 10*3/uL (ref 0.7–4.0)
MCHC: 33.8 g/dL (ref 30.0–36.0)
MCV: 98.4 fl (ref 78.0–100.0)
Monocytes Absolute: 0.8 10*3/uL (ref 0.1–1.0)
Monocytes Relative: 8.7 % (ref 3.0–12.0)
Neutro Abs: 7.7 10*3/uL (ref 1.4–7.7)
Neutrophils Relative %: 81.2 % — ABNORMAL HIGH (ref 43.0–77.0)
Platelets: 242 10*3/uL (ref 150.0–400.0)
RBC: 3.6 Mil/uL — ABNORMAL LOW (ref 3.87–5.11)
RDW: 16.7 % — ABNORMAL HIGH (ref 11.5–15.5)
WBC: 9.5 10*3/uL (ref 4.0–10.5)

## 2020-10-12 LAB — IBC PANEL
Iron: 53 ug/dL (ref 42–145)
Saturation Ratios: 20.7 % (ref 20.0–50.0)
Transferrin: 183 mg/dL — ABNORMAL LOW (ref 212.0–360.0)

## 2020-10-12 LAB — C-REACTIVE PROTEIN: CRP: 2.1 mg/dL (ref 0.5–20.0)

## 2020-10-12 LAB — FERRITIN: Ferritin: 264.8 ng/mL (ref 10.0–291.0)

## 2020-10-12 NOTE — Assessment & Plan Note (Addendum)
Concern for recurrence of R foot osteo - check labs today (CBC, CMP, ESR, CRP). Check foot xray - send to [facility] given recent +COVID.  Continue doxycycline at this time - previous wound culture 07/2020 grew MSSA responsive to doxy. Dry wound today - nothing to culture. Will try to re-establish with wound clinic. She is currently receiving wound care through [facility] - treating wound with ointment and foam dressing. Tramadol ineffective for pain control - will trial hydrocodone - reviewed sedation precautions. Update with effect.

## 2020-10-12 NOTE — Assessment & Plan Note (Signed)
She has received her monthly B12 shot today

## 2020-10-12 NOTE — Assessment & Plan Note (Signed)
Update iron panel.  Iron infusion helped RLS

## 2020-10-12 NOTE — Assessment & Plan Note (Addendum)
And R lower leg  Not evaluated today  Discussed ongoing home wound care

## 2020-10-12 NOTE — Assessment & Plan Note (Signed)
Today is day 9 of symptoms. S/p treatment with Paxlovid through [facility]. Seems to be recovering well from recent COVID illness.  R foot findings not consistent with COVID toes although recent COVID illness may be contributing to acute worsening.

## 2020-10-12 NOTE — Assessment & Plan Note (Addendum)
Recent iron infusion x2 (07/2020) have helped RLS symptoms.  She continues requip 2.5mg  nightly - consider tapering dose.

## 2020-10-13 ENCOUNTER — Other Ambulatory Visit: Payer: Self-pay | Admitting: Family Medicine

## 2020-10-13 ENCOUNTER — Encounter: Payer: Self-pay | Admitting: Family Medicine

## 2020-10-13 ENCOUNTER — Other Ambulatory Visit: Payer: Self-pay

## 2020-10-13 DIAGNOSIS — K219 Gastro-esophageal reflux disease without esophagitis: Secondary | ICD-10-CM | POA: Diagnosis not present

## 2020-10-13 DIAGNOSIS — M1990 Unspecified osteoarthritis, unspecified site: Secondary | ICD-10-CM | POA: Diagnosis not present

## 2020-10-13 DIAGNOSIS — I272 Pulmonary hypertension, unspecified: Secondary | ICD-10-CM | POA: Diagnosis not present

## 2020-10-13 DIAGNOSIS — I872 Venous insufficiency (chronic) (peripheral): Secondary | ICD-10-CM | POA: Diagnosis not present

## 2020-10-13 DIAGNOSIS — M810 Age-related osteoporosis without current pathological fracture: Secondary | ICD-10-CM | POA: Diagnosis not present

## 2020-10-13 DIAGNOSIS — I2510 Atherosclerotic heart disease of native coronary artery without angina pectoris: Secondary | ICD-10-CM | POA: Diagnosis not present

## 2020-10-13 DIAGNOSIS — M5136 Other intervertebral disc degeneration, lumbar region: Secondary | ICD-10-CM | POA: Diagnosis not present

## 2020-10-13 DIAGNOSIS — Z7982 Long term (current) use of aspirin: Secondary | ICD-10-CM | POA: Diagnosis not present

## 2020-10-13 DIAGNOSIS — S92514A Nondisplaced fracture of proximal phalanx of right lesser toe(s), initial encounter for closed fracture: Secondary | ICD-10-CM | POA: Insufficient documentation

## 2020-10-13 DIAGNOSIS — E538 Deficiency of other specified B group vitamins: Secondary | ICD-10-CM | POA: Diagnosis not present

## 2020-10-13 DIAGNOSIS — N1831 Chronic kidney disease, stage 3a: Secondary | ICD-10-CM | POA: Diagnosis not present

## 2020-10-13 DIAGNOSIS — G2581 Restless legs syndrome: Secondary | ICD-10-CM | POA: Diagnosis not present

## 2020-10-13 DIAGNOSIS — I130 Hypertensive heart and chronic kidney disease with heart failure and stage 1 through stage 4 chronic kidney disease, or unspecified chronic kidney disease: Secondary | ICD-10-CM | POA: Diagnosis not present

## 2020-10-13 DIAGNOSIS — G9009 Other idiopathic peripheral autonomic neuropathy: Secondary | ICD-10-CM | POA: Diagnosis not present

## 2020-10-13 DIAGNOSIS — I4891 Unspecified atrial fibrillation: Secondary | ICD-10-CM | POA: Diagnosis not present

## 2020-10-13 DIAGNOSIS — I5032 Chronic diastolic (congestive) heart failure: Secondary | ICD-10-CM | POA: Diagnosis not present

## 2020-10-13 DIAGNOSIS — Z7901 Long term (current) use of anticoagulants: Secondary | ICD-10-CM | POA: Diagnosis not present

## 2020-10-13 DIAGNOSIS — Z9181 History of falling: Secondary | ICD-10-CM | POA: Diagnosis not present

## 2020-10-13 DIAGNOSIS — E7849 Other hyperlipidemia: Secondary | ICD-10-CM | POA: Diagnosis not present

## 2020-10-13 NOTE — Telephone Encounter (Signed)
Pt's daughter, [name] (on dpr), states hydrocodone-APAP is working well for pt.  Says she will be out soon and will need refill.   Also, she's asking if pt needs wound care for her foot.  I mentioned we are checking on [facility] but she still wants to know if wound care clinic should be the an option.  Requests a call back.

## 2020-10-14 MED ORDER — HYDROCODONE-ACETAMINOPHEN 5-325 MG PO TABS: 1.0000 | ORAL_TABLET | Freq: Three times a day (TID) | ORAL | 0 refills | Status: DC | PRN

## 2020-10-14 NOTE — Telephone Encounter (Signed)
Recommend sparing use of hydrocodone given possible side effects of medication. Have refilled. Start with tylenol 500mg  TID.  Don't think needs to see wound at this time given bone involvement. Ok to start with podiatry.

## 2020-10-15 ENCOUNTER — Telehealth: Payer: Self-pay | Admitting: Family Medicine

## 2020-10-15 NOTE — Telephone Encounter (Signed)
10:13 PM Answering service call.  Spoke with [facility] nurse.  Patient's daughter called as patient had a change in status today. Having decreased strength tonight, had difficulty getting off the toilet, and per nurse patient had some increased trouble understanding at one point. Also not able to lift her arms up as usual to allow her daughter to help get her out of bed.  Did report a low-grade temperature and vomiting earlier.    Recently has been on treatment for osteomyelitis in her foot. Recent office visit with Dr. Danise Mina reviewed as well as labs and x-ray.  Also noted recent COVID-19 infection, status post treatment with Paxlovid, but improving based on last visit 4 days ago.  With change in mental status, change in strength today, low-grade temp and vomiting, concern for worsening underling infection with possible sepsis.  Team health nurse outcome was ER evaluation which I agree with.  Will route note to PCP.

## 2020-10-16 DIAGNOSIS — Z7901 Long term (current) use of anticoagulants: Secondary | ICD-10-CM | POA: Diagnosis not present

## 2020-10-16 DIAGNOSIS — G2581 Restless legs syndrome: Secondary | ICD-10-CM | POA: Diagnosis not present

## 2020-10-16 DIAGNOSIS — M810 Age-related osteoporosis without current pathological fracture: Secondary | ICD-10-CM | POA: Diagnosis not present

## 2020-10-16 DIAGNOSIS — I5032 Chronic diastolic (congestive) heart failure: Secondary | ICD-10-CM | POA: Diagnosis not present

## 2020-10-16 DIAGNOSIS — M1990 Unspecified osteoarthritis, unspecified site: Secondary | ICD-10-CM | POA: Diagnosis not present

## 2020-10-16 DIAGNOSIS — E7849 Other hyperlipidemia: Secondary | ICD-10-CM | POA: Diagnosis not present

## 2020-10-16 DIAGNOSIS — I872 Venous insufficiency (chronic) (peripheral): Secondary | ICD-10-CM | POA: Diagnosis not present

## 2020-10-16 DIAGNOSIS — E538 Deficiency of other specified B group vitamins: Secondary | ICD-10-CM | POA: Diagnosis not present

## 2020-10-16 DIAGNOSIS — N1831 Chronic kidney disease, stage 3a: Secondary | ICD-10-CM | POA: Diagnosis not present

## 2020-10-16 DIAGNOSIS — I272 Pulmonary hypertension, unspecified: Secondary | ICD-10-CM | POA: Diagnosis not present

## 2020-10-16 DIAGNOSIS — I4891 Unspecified atrial fibrillation: Secondary | ICD-10-CM | POA: Diagnosis not present

## 2020-10-16 DIAGNOSIS — I130 Hypertensive heart and chronic kidney disease with heart failure and stage 1 through stage 4 chronic kidney disease, or unspecified chronic kidney disease: Secondary | ICD-10-CM | POA: Diagnosis not present

## 2020-10-16 DIAGNOSIS — Z7982 Long term (current) use of aspirin: Secondary | ICD-10-CM | POA: Diagnosis not present

## 2020-10-16 DIAGNOSIS — M5136 Other intervertebral disc degeneration, lumbar region: Secondary | ICD-10-CM | POA: Diagnosis not present

## 2020-10-16 DIAGNOSIS — K219 Gastro-esophageal reflux disease without esophagitis: Secondary | ICD-10-CM | POA: Diagnosis not present

## 2020-10-16 DIAGNOSIS — Z9181 History of falling: Secondary | ICD-10-CM | POA: Diagnosis not present

## 2020-10-16 DIAGNOSIS — G9009 Other idiopathic peripheral autonomic neuropathy: Secondary | ICD-10-CM | POA: Diagnosis not present

## 2020-10-16 DIAGNOSIS — I2510 Atherosclerotic heart disease of native coronary artery without angina pectoris: Secondary | ICD-10-CM | POA: Diagnosis not present

## 2020-10-16 NOTE — Telephone Encounter (Addendum)
Spoke with [name] and she didn't take [name] to ER. It was late and [name] refused to go. [name] was sleeping and stable so she didn't want to disrupt her in the middle of the night. [name] slept through the night and she had no issues breathing through the night, when [name] woke up this morning she seemed fine. She woke up normally stood up unassisted, got into her recliner and requested breakfast with no issues.   [name] request call back from Dr. Darnell Level directly to discuss concerns and next steps on what to do or what help [name] needs/ next level of care. [name] is sleeping often and daughter doesn't know if it's the norco or if it's normal for [name]'s status.   **Dr. Darnell Level please call [name] back at [phone]**

## 2020-10-16 NOTE — Telephone Encounter (Signed)
[place] Night - Client TELEPHONE ADVICE RECORD [facility] Patient Name: [name] Gender: Female DOB: 12/09/1929 Age: 87 Y 44 M 14 D Return Phone Number: [phone] (Primary) Address: City/ State/ Zip: [place]  [address] Client [place] Night - Client Client Site [place] Physician Ria Bush - MD Contact Type Call Who Is Calling Patient / Member / Family / Caregiver Call Type Triage / Clinical Caller Name [name] Relationship To Patient Daughter Return Phone Number [phone] (Primary) Chief Complaint Wound Infection Reason for Call Symptomatic / Request for [place] states her mom is recovering from her bone infection and wound on her foot. After laying down for an hours she woke up throwing up. Her temperature this afternoon it was 97.3 and now it is 99.9. She is unable to bare her weight to get on the potty next her bed. She is unable to use her arms or legs. it is almost time for her night time medicine she is not sure what to do. Translation No Nurse Assessment Nurse: Kirk Ruths, RN, [facility] Date/Time (Eastern Time): 10/15/2020 9:38:54 PM Confirm and document reason for call. If symptomatic, describe symptoms. ---Caller states patient is recovering from a wound infection in the bone of her foot wound clinic for 18 months wound was supposed to be healed on antibiotic but now is getting dark unable to walk on that foot balance issues as well pain is extreme doxycycline oxycodone in use seen Tuesday had been doing well but now she has low grade temp walked earlier today for first time in a week. has been throwing up this afternoon too weak to use arms or legs to help with getting on/off bed and potty does not seem to be understanding directions. early dementia but this is different patient keeps repeating that she is just  so tired worried will not be able to get her nighttime meds administered acyclovir, eliquis, gabapentin, namenda, metoprolol, requip, hydrocodone, Does the patient have any new or worsening symptoms? ---Yes Will a triage be completed? ---Yes Related visit to physician within the last 2 weeks? ---Yes Does the PT have any chronic conditions? (i.e. diabetes, asthma, this includes High risk factors for pregnancy, etc.) ---Yes PLEASE NOTE: All timestamps contained within this report are represented as [place] Standard Time. CONFIDENTIALTY NOTICE: This fax transmission is intended only for the addressee. It contains information that is legally privileged, confidential or otherwise protected from use or disclosure. If you are not the intended recipient, you are strictly prohibited from reviewing, disclosing, copying using or disseminating any of this information or taking any action in reliance on or regarding this information. If you have received this fax in error, please notify us immediately by telephone so that we can arrange for its return to [place]. Phone: [phone], Toll-Free: [phone], Fax: [phone] Page: 2 of 3 Call Id: [id number] Nurse Assessment List chronic conditions. ---RLS, afib, neuropathy, pain, htn Is this a behavioral health or substance abuse call? ---No Guidelines Guideline Title Affirmed Question Affirmed Notes Nurse Date/Time (Eastern Time) Wound Infection Patient sounds very sick or weak to the triager Kirk Ruths, RN, [facility] 10/15/2020 9:51:55 PM Disp. Time Eilene Ghazi Time) Disposition Final User 10/15/2020 10:12:54 PM Called On-Call Provider Kirk Ruths, RN, [facility] 10/15/2020 10:00:36 PM Go to ED Now (or PCP triage) Yes Kirk Ruths, RN, Liana Gerold Disagree/Comply Disagree Caller Understands Yes PreDisposition Call Doctor Care Advice Given Per Guideline GO TO ED NOW (OR PCP  TRIAGE): ANOTHER ADULT SHOULD DRIVE: * It is better and safer if another adult drives instead of you.  CARE ADVICE per Wound Infection (Adult) guideline. * IF PCP SECOND-LEVEL TRIAGE REQUIRED: You may need to be seen. Your doctor (or NP/PA) will want to talk with you to decide what's best. I'll page the provider on-call now. If you haven't heard from the provider (or me) within 30 minutes, go directly to the [facility]. Comments User: Tawni Levy, RN Date/Time Eilene Ghazi Time): 10/15/2020 10:24:01 PM Caller made aware of MD instructions. She is not taking patient to the ER at this time but will continue to monitor and keep patient comfortable and call back for any worsening s/s Referrals [facility] - ED Paging DoctorName Phone DateTime Result/ Outcome Message Type Notes Merri Ray- MD [phone] 10/15/2020 10:12:54 PM Called On Call Provider - Reached Doctor Paged Merri Ray- MD 10/15/2020 10:14:47 PM Spoke with On Call - General Message Result Advise daughter that the concern is if patient is possibly becoming septic so an ER evaluation is the PLEASE NOTE: All timestamps contained within this report are represented as [place] Standard Time. CONFIDENTIALTY NOTICE: This fax transmission is intended only for the addressee. It contains information that is legally privileged, confidential or otherwise protected from use or disclosure. If you are not the intended recipient, you are strictly prohibited from reviewing, disclosing, copying using or disseminating any of this information or taking any action in reliance on or regarding this information. If you have received this fax in error, please notify us immediately by telephone so that we can arrange for its return to [place]. Phone: [phone], Toll-Free: [phone], Fax: [phone] Page: 3 of 3 Call Id: [id number] Paging DoctorName Phone DateTime Result/ Outcome Message Type Notes preferred choice for care. If does not want to and wishes to f/u with PCP in the am that is up  to daughter but the ER is the overall recommendation. On call will send PCP a message as well to let him know what is going on with patient for first thing in the morning

## 2020-10-16 NOTE — Telephone Encounter (Signed)
Please call daughter [name] this morning to check on pt.  Given worsening symptoms, AMS, agree with concern for worsening osteo and referral to ER as may need IV antibiotics.

## 2020-10-16 NOTE — Telephone Encounter (Signed)
Spoke with daughter [name].  Tough night last night. Episode of vomiting, fever last night. Pt refused ER eval last night.  Today feeling some better, but very tired. Discussed hydrocodone dosing - rec back off this use given concern it could be contributing to sedation/somnolence.  Reviewed ER precautions.

## 2020-10-16 NOTE — Telephone Encounter (Signed)
Tried contacting the patient and they did not answer, LVM for her to call back.

## 2020-10-17 ENCOUNTER — Encounter: Payer: Self-pay | Admitting: Podiatry

## 2020-10-17 ENCOUNTER — Ambulatory Visit: Payer: Medicare Other | Admitting: Podiatry

## 2020-10-17 ENCOUNTER — Telehealth: Payer: Self-pay

## 2020-10-17 ENCOUNTER — Ambulatory Visit: Payer: Medicare Other | Admitting: Cardiovascular Disease

## 2020-10-17 ENCOUNTER — Other Ambulatory Visit: Payer: Self-pay

## 2020-10-17 DIAGNOSIS — I130 Hypertensive heart and chronic kidney disease with heart failure and stage 1 through stage 4 chronic kidney disease, or unspecified chronic kidney disease: Secondary | ICD-10-CM | POA: Diagnosis not present

## 2020-10-17 DIAGNOSIS — S90821A Blister (nonthermal), right foot, initial encounter: Secondary | ICD-10-CM

## 2020-10-17 DIAGNOSIS — I272 Pulmonary hypertension, unspecified: Secondary | ICD-10-CM | POA: Diagnosis not present

## 2020-10-17 DIAGNOSIS — I739 Peripheral vascular disease, unspecified: Secondary | ICD-10-CM | POA: Diagnosis not present

## 2020-10-17 DIAGNOSIS — I872 Venous insufficiency (chronic) (peripheral): Secondary | ICD-10-CM | POA: Diagnosis not present

## 2020-10-17 DIAGNOSIS — M810 Age-related osteoporosis without current pathological fracture: Secondary | ICD-10-CM | POA: Diagnosis not present

## 2020-10-17 DIAGNOSIS — E538 Deficiency of other specified B group vitamins: Secondary | ICD-10-CM | POA: Diagnosis not present

## 2020-10-17 DIAGNOSIS — M1990 Unspecified osteoarthritis, unspecified site: Secondary | ICD-10-CM | POA: Diagnosis not present

## 2020-10-17 DIAGNOSIS — T148XXA Other injury of unspecified body region, initial encounter: Secondary | ICD-10-CM | POA: Diagnosis not present

## 2020-10-17 DIAGNOSIS — G2581 Restless legs syndrome: Secondary | ICD-10-CM | POA: Diagnosis not present

## 2020-10-17 DIAGNOSIS — M5136 Other intervertebral disc degeneration, lumbar region: Secondary | ICD-10-CM | POA: Diagnosis not present

## 2020-10-17 DIAGNOSIS — G9009 Other idiopathic peripheral autonomic neuropathy: Secondary | ICD-10-CM | POA: Diagnosis not present

## 2020-10-17 DIAGNOSIS — K219 Gastro-esophageal reflux disease without esophagitis: Secondary | ICD-10-CM | POA: Diagnosis not present

## 2020-10-17 DIAGNOSIS — Z9181 History of falling: Secondary | ICD-10-CM | POA: Diagnosis not present

## 2020-10-17 DIAGNOSIS — Z7982 Long term (current) use of aspirin: Secondary | ICD-10-CM | POA: Diagnosis not present

## 2020-10-17 DIAGNOSIS — E7849 Other hyperlipidemia: Secondary | ICD-10-CM | POA: Diagnosis not present

## 2020-10-17 DIAGNOSIS — I5032 Chronic diastolic (congestive) heart failure: Secondary | ICD-10-CM | POA: Diagnosis not present

## 2020-10-17 DIAGNOSIS — I2510 Atherosclerotic heart disease of native coronary artery without angina pectoris: Secondary | ICD-10-CM | POA: Diagnosis not present

## 2020-10-17 DIAGNOSIS — Z7901 Long term (current) use of anticoagulants: Secondary | ICD-10-CM | POA: Diagnosis not present

## 2020-10-17 DIAGNOSIS — N1831 Chronic kidney disease, stage 3a: Secondary | ICD-10-CM | POA: Diagnosis not present

## 2020-10-17 DIAGNOSIS — I4891 Unspecified atrial fibrillation: Secondary | ICD-10-CM | POA: Diagnosis not present

## 2020-10-17 NOTE — Telephone Encounter (Signed)
Phone call returned to daughter, [name]. [name] shared that patient has not been feeling well. Patient is a high fall risk and has more frequent falls with injuries of skin tears. Patient had a wound on her foot that has caused her a lot of pain. Patient's wound had healed but pain continued. MD changed pain meds to hydorocodone. Daughter shared that patient recently diagnosed with osteomyelitis and wound resurfacing. Patient to see podiatrist today. Visit scheduled with Palliative care NP for 10/18/20.

## 2020-10-17 NOTE — Progress Notes (Signed)
Subjective:  Patient ID: [name], female    DOB: 06/23/1929,  MRN: [id number]  Chief Complaint  Patient presents with   Nail Problem    Nail trim Place on the outside of the right foot looks like a blood blister     87 y.o. female presents with the above complaint.  Patient presents with new complaint of right lateral foot blood blister.  Patient states that this started happening over the last few days.  Patient was recently discharged from wound care after the wound closed up.  She does not recall anything rubbing against it.  She has been ambulating in regular shoes and most of the time and no shoes at all.  She denies any other acute complaints she has not anything for it.  SHe would like to get it evaluated.   Review of Systems: Negative except as noted in the HPI. Denies N/V/F/Ch.  Past Medical History:  Diagnosis Date   Abdominal aortic atherosclerosis ([place]) 09/2015   by xray   Anxiety    BCC (basal cell carcinoma of skin) 05/2011   on nose   BCC (basal cell carcinoma of skin) 08/09/2020   upper chest   Breast lesion    a. diagnosed with complex sclerosing lesion with calcifications of the right breast in 08/2016   Chest pain    a. nuclear stress test 03/2012: normal; b. 09/2018 MV: EF >65%, no isch/scar.    DDD (degenerative disc disease), lumbosacral 12/06/1992   Gallstones 09/2015   incidentally by xray   GERD (gastroesophageal reflux disease) 05/1999   HERPES ZOSTER 04/13/2007   Qualifier: Diagnosis of  By: [name], [name]    Hiatal hernia    History of shingles    ophthalmic, takes acyclovir daily   Hyperlipidemia 12/2003   Hypertension 1990   Mitral regurgitation    a. echo 03/2012: EF 60-65%, no RWMA, mild to mod AI/MR, mod TR, PASP 37 mmHg   Osteoarthritis 08/21/1989   Osteoporosis with fracture 11/1997   with compression fracture T12   Persistent atrial fibrillation ([place]) 03/16/2012   a. CHADS2VASc => 5 (HTN, age x 2, vascular disease, sex  category)-->Eliquis 5 BID;  b. Recurrent AF in 06/2016;  c. 01/2017 s/p DCCV;  d. 02/2017 recurrent AFib->Amio added->DCCV; e. 03/2017 recurrent AFib, s/p reload of amio and DCCV 04/02/2017   POSTHERPETIC NEURALGIA 04/20/2007   Qualifier: Diagnosis of  By: [name], Billie-Lynn Daniels    Rheumatoid arthritis (HCC)    RLS (restless legs syndrome)     Current Outpatient Medications:    acetaminophen (TYLENOL) 500 MG tablet, Take 1,000 mg every 8 (eight) hours as needed by mouth for moderate pain. , Disp: , Rfl:    acyclovir (ZOVIRAX) 400 MG tablet, Take 400 mg by mouth 2 (two) times daily., Disp: , Rfl:    Alpha-Lipoic Acid 600 MG TABS, Take by mouth., Disp: , Rfl:    amiodarone (PACERONE) 200 MG tablet, TAKE 1/2 TABLET (100MG ) BY MOUTH ONCE DAILY, Disp: 15 tablet, Rfl: 3   aspirin EC 81 MG tablet, Take 1 tablet (81 mg total) by mouth daily., Disp: 150 tablet, Rfl: 2   atorvastatin (LIPITOR) 20 MG tablet, TAKE 1 TABLET BY MOUTH ONCE DAILY, Disp: 90 tablet, Rfl: 1   ciclopirox (LOPROX) 0.77 % cream, Apply topically 2 (two) times daily. Apply to entire foot including between toes of both feet, Disp: 30 g, Rfl: 4   doxycycline (VIBRA-TABS) 100 MG tablet, Take 1 tablet (100 mg  total) by mouth 2 (two) times daily., Disp: 84 tablet, Rfl: 0   DULoxetine (CYMBALTA) 60 MG capsule, TAKE 1 CAPSULE BY MOUTH ONCE DAILY, Disp: 90 capsule, Rfl: 1   ELIQUIS 2.5 MG TABS tablet, TAKE 1 TABLET BY MOUTH TWICE DAILY, Disp: 180 tablet, Rfl: 1   gabapentin (NEURONTIN) 400 MG capsule, Take 1 capsule (400 mg total) by mouth at bedtime. With second dose during day as needed, Disp: 150 capsule, Rfl: 1   HYDROcodone-acetaminophen (NORCO/VICODIN) 5-325 MG tablet, Take 1 tablet by mouth 3 (three) times daily as needed for moderate pain (sedation precautions)., Disp: 15 tablet, Rfl: 0   lisinopril (ZESTRIL) 10 MG tablet, TAKE 1 TABLET BY MOUTH ONCE DAILY, Disp: 30 tablet, Rfl: 6   memantine (NAMENDA) 5 MG tablet, TAKE 1 TABLET  BY MOUTH TWICE DAILY, Disp: 60 tablet, Rfl: 6   metoprolol tartrate (LOPRESSOR) 25 MG tablet, TAKE 1/2 TABLET BY MOUTH TWICE DAILY, Disp: 90 tablet, Rfl: 3   pantoprazole (PROTONIX) 40 MG tablet, TAKE 1 TABLET BY MOUTH EVERY DAY, Disp: 90 tablet, Rfl: 1   polyethylene glycol (MIRALAX / GLYCOLAX) 17 g packet, Take 17 g by mouth daily as needed for mild constipation., Disp: 14 each, Rfl: 0   prednisoLONE acetate (PRED FORTE) 1 % ophthalmic suspension, Place 1 drop into the left eye daily. , Disp: , Rfl:    rOPINIRole (REQUIP) 1 MG tablet, Take 2.5 tablets (2.5 mg total) by mouth at bedtime., Disp: 225 tablet, Rfl: 1   silver sulfADIAZINE (SILVADENE) 1 % cream, Apply 1 application topically daily., Disp: 50 g, Rfl: 0   tiZANidine (ZANAFLEX) 2 MG tablet, Take by mouth daily., Disp: , Rfl:    traMADol (ULTRAM) 50 MG tablet, Take 50 mg by mouth daily. Takes 2 tablets daily, Disp: , Rfl: 0   Vitamin D, Ergocalciferol, (DRISDOL) 1.25 MG (50000 UNIT) CAPS capsule, TAKE 1 CAPSULE BY MOUTH ONCE WEEKLY, Disp: 12 capsule, Rfl: 3  Social History   Tobacco Use  Smoking Status Former   Packs/day: 0.25   Years: 1.00   Pack years: 0.25   Types: Cigarettes   Quit date: 04/08/1976   Years since quitting: 44.5  Smokeless Tobacco Never  Tobacco Comments   pt smokes occasionally "socially"    Allergies  Allergen Reactions   Penicillins Rash and Other (See Comments)    Childhood Allergy Has patient had a PCN reaction causing immediate rash, facial/tongue/throat swelling, SOB or lightheadedness with hypotension: Yes Has patient had a PCN reaction causing severe rash involving mucus membranes or skin necrosis: Unknown Has patient had a PCN reaction that required hospitalization: No Has patient had a PCN reaction occurring within the last 10 years: No If all of the above answers are "NO", then may proceed with Cephalosporin use.    Objective:  There were no vitals filed for this visit. There is no height  or weight on file to calculate BMI. Constitutional Well developed. Well nourished.  Vascular Dorsalis pedis pulses palpable bilaterally. Posterior tibial pulses palpable bilaterally. Capillary refill normal to all digits.  No cyanosis or clubbing noted. Pedal hair growth normal.  Neurologic Normal speech. Oriented to person, place, and time. Epicritic sensation to light touch grossly present bilaterally.  Dermatologic Blood blister/friction blister was drained to the right lateral side.  No purulent drainage noted.  Superficial granular wound noted underneath it.  Slight maceration present.  No malodor present.  Orthopedic: Normal joint ROM without pain or crepitus bilaterally. No visible deformities. No bony tenderness.  Radiographs: None Assessment:   1. Peripheral vascular disease ([place])   2. Friction blister of the foot, right, initial encounter   3. Blood blister    Plan:  Patient was evaluated and treated and all questions answered.  Right lateral fifth metatarsal blood blister/friction blister -I explained to the patient the etiology of blister versus treatment options were extensively discussed.  -I discussed with her do Betadine wet-to-dry dressing change. -The friction blister was drained and lanced in standard technique.  No purulent drainage was noted. -Continue wearing surgical shoe -Patient is already on doxycycline for long-term management of osteomyelitis.  No deeper wounds noted that correlate with the underlying bone. -I discussed with her that if her wound or redness gets worse go to the emergency room right away.  Patient states understanding  No follow-ups on file.

## 2020-10-18 ENCOUNTER — Other Ambulatory Visit: Payer: Medicare Other | Admitting: Student

## 2020-10-18 DIAGNOSIS — R630 Anorexia: Secondary | ICD-10-CM

## 2020-10-18 DIAGNOSIS — R52 Pain, unspecified: Secondary | ICD-10-CM | POA: Diagnosis not present

## 2020-10-18 DIAGNOSIS — S90821D Blister (nonthermal), right foot, subsequent encounter: Secondary | ICD-10-CM

## 2020-10-18 DIAGNOSIS — M869 Osteomyelitis, unspecified: Secondary | ICD-10-CM

## 2020-10-18 DIAGNOSIS — R531 Weakness: Secondary | ICD-10-CM | POA: Diagnosis not present

## 2020-10-18 DIAGNOSIS — Z515 Encounter for palliative care: Secondary | ICD-10-CM | POA: Diagnosis not present

## 2020-10-18 NOTE — Progress Notes (Signed)
[facility] Consult Note Telephone: [phone]  Fax: [phone]    Date of encounter: 10/18/20 PATIENT NAME: [name] [address] [place] [address]   [phone] (home)  DOB: 1929/08/29 MRN: [id number] PRIMARY CARE PROVIDER:    Ria Bush, MD,  [place] [address] [phone]  REFERRING PROVIDER:   Ria Bush, MD [address] [place],  [place] [address] [phone]  RESPONSIBLE PARTY:    Contact Information     Name Relation Home Work [name] Daughter [phone]     [name] Daughter [phone]  [phone]   [name] Daughter [phone]  [phone]   [place] Daughter   [phone]        I met face to face with patient and family in the home.  Palliative Care was asked to follow this patient by consultation request of  Crystal Bush, MD to address advance care planning and complex medical decision making. This is a follow up visit.                                   ASSESSMENT AND PLAN / RECOMMENDATIONS:   Advance Care Planning/Goals of Care: Goals include to maximize quality of life and symptom management. Our advance care planning conversation included a discussion about:    The value and importance of advance care planning  Experiences with loved ones who have been seriously ill or have died  Exploration of personal, cultural or spiritual beliefs that might influence medical decisions  Exploration of goals of care in the event of a sudden injury or illness  Identification and preparation of a healthcare agent  CODE STATUS: DNR  Symptom Management/Plan:  Generalized weakness- patient with generalized weakness, recent Covid 19 infection, diastolic HF. Requires assistance with ambulation. Recommend PT referral due to LE weakness, impaired gait/balance, recent falls.   Appetite- patient with recent  decline in weight. Recommend eating foods she enjoys, continue snacks, continue Meals on Wheels program. Recommend nutritional supplement one daily; if eating less than 50% of a meal, drink a nutritional supplement.   Wounds-patient with hx of osteomyelitis; new blister to right lateral foot. Continue daily wound care with betadine, cover with dressing. Wound to right shin, continue silvadene and dressing daily. Right elbow-area scabbed over, LOTA. Continue doxycycline as directed. Follow up with PCP, podiatry as scheduled.   Pain-patient with foot pain. Continue Norco 5/324m half tablet TID PRN. Alternating with tylenol PRN.  Follow up Palliative Care Visit: Palliative care will continue to follow for complex medical decision making, advance care planning, and clarification of goals. Return  weeks or prn.  I spent 60 minutes providing this consultation. More than 50% of the time in this consultation was spent in counseling and care coordination.   PPS: 40%  HOSPICE ELIGIBILITY/DIAGNOSIS: TBD  Chief Complaint: Palliative Medicine follow up visit.   HISTORY OF PRESENT ILLNESS:  Crystal ASSADis a 87y.o. year old female  with PVD, chronic ulcer to right foot, osteomyelitis, chronic diastolic heart failure, mild dementia, polyneuropathy, RLS, hypertension, atrial fibrillation, hyperlipidemia, CKD stage 3.  [place] [place] Daughter PMardene Celestehas been staying with patient recently. Patient reported to have nausea/vomiting in past week, increase in somnolence, low grade fever of 99. 9.  Her norco was decreased. Had Covid-19 infection 4 weeks ago. Treated with Paxlovid. She has  increased weakness, more difficulty ambulating. Decline in appetite. She was found to have a blister pop up on area to right foot where she had osteomyelitis. The wound had recently healed after 18 months of treatment per patient and daughter. Patient requiring more assistance with adl's. [name]  states she was mostly independent up until two weeks ago. She denies pain at present; she states norco is helping with the pain. Decline in appetite reported; last weight 130 pounds. Drinks 1 Boost a day. Receive Meals on Wheels 5 days a week. She has an aid coming in two mornings a week to help with morning routine. Daughter looking into assistance on MWF.   Patient observed resting in recliner. Pleasant affect; welcomes visit. Patient has wound to right lateral foot. Approximate dime size. Patient and daughter state it has decreased in size some. Top layer of blister had been removed. No drainage observed. Perio wound intact. Skin tear right elbow; scabbed. Left open to air.  X-ray of right foot last week. 5th toe fracture.   History obtained from review of EMR, discussion with primary team, and interview with family, facility staff/caregiver and/or [name].  I reviewed available labs, medications, imaging, studies and related documents from the EMR.  Records reviewed and summarized above.   ROS  General: NAD EYES: denies vision changes ENMT: denies dysphagia Cardiovascular: denies chest pain Pulmonary: denies cough, SOB with exertion Abdomen: endorses fair appetite, denies constipation, endorses continence of bowel GU: denies dysuria MSK: weakness, 3 recent falls reported Skin: wound to right lateral foot, right shin, right elbow Neurological: denies insomnia Psych: Endorses positive mood Heme/lymph/immuno: denies abnormal bleeding  Physical Exam: Weight: 130 pounds Pulse 66, resp 18, b/p 160/80, sats 97% on room air Constitutional: NAD General: frail appearing, thin EYES: anicteric sclera, lids intact, no discharge  ENMT: intact hearing, oral mucous membranes moist, dentition intact CV: S1S2, RRR, pedal LE edema Pulmonary: LCTA, no increased work of breathing, no cough Abdomen: normo-active BS + 4 quadrants, soft and non tender GU: deferred MSK: moves all extremities,  ambulatory short distances Skin: warm and dry, sheared blister to right lateral foot, skin tear scabbed over to right elbow, wound to right shin, left foot purple Neuro: generalized weakness, A & O x 3 Psych: non-anxious affect Hem/lymph/immuno: no widespread bruising   Thank you for the opportunity to participate in the care of [name].  The palliative care team will continue to follow. Please call our office at [phone] if we can be of additional assistance.   Ezekiel Slocumb, NP   COVID-19 PATIENT SCREENING TOOL Asked and negative response unless otherwise noted:   Have you had symptoms of covid, tested positive or been in contact with someone with symptoms/positive test in the past 5-10 days? No

## 2020-10-18 NOTE — Telephone Encounter (Addendum)
Spoke with [name] for update on mom.  Doing some better. Only taking 1/2 hydrocodone as needed.  Palliative care NP coming out today.  Saw podiatry yesterday - blood blister drained, no deeper wound, planned 3 wk f/u.  Discussed likely chronic osteomyelitis and option for ID eval if wound not healing well.

## 2020-10-19 ENCOUNTER — Telehealth: Payer: Self-pay | Admitting: *Deleted

## 2020-10-19 ENCOUNTER — Telehealth: Payer: Self-pay

## 2020-10-19 DIAGNOSIS — S92514A Nondisplaced fracture of proximal phalanx of right lesser toe(s), initial encounter for closed fracture: Secondary | ICD-10-CM

## 2020-10-19 DIAGNOSIS — M869 Osteomyelitis, unspecified: Secondary | ICD-10-CM

## 2020-10-19 DIAGNOSIS — R2689 Other abnormalities of gait and mobility: Secondary | ICD-10-CM

## 2020-10-19 DIAGNOSIS — W19XXXA Unspecified fall, initial encounter: Secondary | ICD-10-CM

## 2020-10-19 NOTE — Telephone Encounter (Signed)
Maxwell Caul, RN sent me and Dr. Darnell Level a message saying:  Jodi Marble, Our Palliative NP, Phineas Real, was out to see Ms. Terpening yesterday. Latoya asked that I reach out and ask if a referral to home health can be sent for physical therapy due to worsening LE weakness, impaired balanceand 3 recent falls. Thank you for your time!   Her call back # is [phone] - office number and cell #  [phone].

## 2020-10-19 NOTE — Telephone Encounter (Signed)
Message sent to PCP and to [facility], to provide update on Palliative NP visit and request orders to be sent for Physical therapy to increasing weakness and falls.

## 2020-10-19 NOTE — Telephone Encounter (Signed)
Thanks Shapale.  New HH referral placed.

## 2020-10-20 DIAGNOSIS — G2581 Restless legs syndrome: Secondary | ICD-10-CM | POA: Diagnosis not present

## 2020-10-20 DIAGNOSIS — M1990 Unspecified osteoarthritis, unspecified site: Secondary | ICD-10-CM | POA: Diagnosis not present

## 2020-10-20 DIAGNOSIS — Z9181 History of falling: Secondary | ICD-10-CM | POA: Diagnosis not present

## 2020-10-20 DIAGNOSIS — M810 Age-related osteoporosis without current pathological fracture: Secondary | ICD-10-CM | POA: Diagnosis not present

## 2020-10-20 DIAGNOSIS — Z7901 Long term (current) use of anticoagulants: Secondary | ICD-10-CM | POA: Diagnosis not present

## 2020-10-20 DIAGNOSIS — E538 Deficiency of other specified B group vitamins: Secondary | ICD-10-CM | POA: Diagnosis not present

## 2020-10-20 DIAGNOSIS — I4891 Unspecified atrial fibrillation: Secondary | ICD-10-CM | POA: Diagnosis not present

## 2020-10-20 DIAGNOSIS — I130 Hypertensive heart and chronic kidney disease with heart failure and stage 1 through stage 4 chronic kidney disease, or unspecified chronic kidney disease: Secondary | ICD-10-CM | POA: Diagnosis not present

## 2020-10-20 DIAGNOSIS — E7849 Other hyperlipidemia: Secondary | ICD-10-CM | POA: Diagnosis not present

## 2020-10-20 DIAGNOSIS — M5136 Other intervertebral disc degeneration, lumbar region: Secondary | ICD-10-CM | POA: Diagnosis not present

## 2020-10-20 DIAGNOSIS — I272 Pulmonary hypertension, unspecified: Secondary | ICD-10-CM | POA: Diagnosis not present

## 2020-10-20 DIAGNOSIS — K219 Gastro-esophageal reflux disease without esophagitis: Secondary | ICD-10-CM | POA: Diagnosis not present

## 2020-10-20 DIAGNOSIS — N1831 Chronic kidney disease, stage 3a: Secondary | ICD-10-CM | POA: Diagnosis not present

## 2020-10-20 DIAGNOSIS — Z7982 Long term (current) use of aspirin: Secondary | ICD-10-CM | POA: Diagnosis not present

## 2020-10-20 DIAGNOSIS — G9009 Other idiopathic peripheral autonomic neuropathy: Secondary | ICD-10-CM | POA: Diagnosis not present

## 2020-10-20 DIAGNOSIS — I2510 Atherosclerotic heart disease of native coronary artery without angina pectoris: Secondary | ICD-10-CM | POA: Diagnosis not present

## 2020-10-20 DIAGNOSIS — I872 Venous insufficiency (chronic) (peripheral): Secondary | ICD-10-CM | POA: Diagnosis not present

## 2020-10-20 DIAGNOSIS — I5032 Chronic diastolic (congestive) heart failure: Secondary | ICD-10-CM | POA: Diagnosis not present

## 2020-10-23 DIAGNOSIS — Z7982 Long term (current) use of aspirin: Secondary | ICD-10-CM | POA: Diagnosis not present

## 2020-10-23 DIAGNOSIS — N1831 Chronic kidney disease, stage 3a: Secondary | ICD-10-CM | POA: Diagnosis not present

## 2020-10-23 DIAGNOSIS — M5136 Other intervertebral disc degeneration, lumbar region: Secondary | ICD-10-CM | POA: Diagnosis not present

## 2020-10-23 DIAGNOSIS — Z7901 Long term (current) use of anticoagulants: Secondary | ICD-10-CM | POA: Diagnosis not present

## 2020-10-23 DIAGNOSIS — I4891 Unspecified atrial fibrillation: Secondary | ICD-10-CM | POA: Diagnosis not present

## 2020-10-23 DIAGNOSIS — E538 Deficiency of other specified B group vitamins: Secondary | ICD-10-CM | POA: Diagnosis not present

## 2020-10-23 DIAGNOSIS — M810 Age-related osteoporosis without current pathological fracture: Secondary | ICD-10-CM | POA: Diagnosis not present

## 2020-10-23 DIAGNOSIS — I272 Pulmonary hypertension, unspecified: Secondary | ICD-10-CM | POA: Diagnosis not present

## 2020-10-23 DIAGNOSIS — M1990 Unspecified osteoarthritis, unspecified site: Secondary | ICD-10-CM | POA: Diagnosis not present

## 2020-10-23 DIAGNOSIS — E7849 Other hyperlipidemia: Secondary | ICD-10-CM | POA: Diagnosis not present

## 2020-10-23 DIAGNOSIS — G9009 Other idiopathic peripheral autonomic neuropathy: Secondary | ICD-10-CM | POA: Diagnosis not present

## 2020-10-23 DIAGNOSIS — I5032 Chronic diastolic (congestive) heart failure: Secondary | ICD-10-CM | POA: Diagnosis not present

## 2020-10-23 DIAGNOSIS — Z9181 History of falling: Secondary | ICD-10-CM | POA: Diagnosis not present

## 2020-10-23 DIAGNOSIS — I130 Hypertensive heart and chronic kidney disease with heart failure and stage 1 through stage 4 chronic kidney disease, or unspecified chronic kidney disease: Secondary | ICD-10-CM | POA: Diagnosis not present

## 2020-10-23 DIAGNOSIS — K219 Gastro-esophageal reflux disease without esophagitis: Secondary | ICD-10-CM | POA: Diagnosis not present

## 2020-10-23 DIAGNOSIS — I872 Venous insufficiency (chronic) (peripheral): Secondary | ICD-10-CM | POA: Diagnosis not present

## 2020-10-23 DIAGNOSIS — I2510 Atherosclerotic heart disease of native coronary artery without angina pectoris: Secondary | ICD-10-CM | POA: Diagnosis not present

## 2020-10-23 DIAGNOSIS — G2581 Restless legs syndrome: Secondary | ICD-10-CM | POA: Diagnosis not present

## 2020-10-24 ENCOUNTER — Ambulatory Visit: Payer: Medicare Other | Admitting: Family Medicine

## 2020-10-24 NOTE — Telephone Encounter (Addendum)
Spoke with pt's daughter, [name] (on dpr), relaying Dr. Synthia Innocent message.  Verbalizes understanding.  States the area on foot was a blood blister.  Podiatry peeled it to let drain now area is healing.  Also, states the boot was a Therapist, sports.  Expresses her thanks.  FYI, to Dr. Darnell Level.

## 2020-10-25 ENCOUNTER — Emergency Department: Payer: Medicare Other

## 2020-10-25 ENCOUNTER — Inpatient Hospital Stay
Admission: EM | Admit: 2020-10-25 | Discharge: 2020-10-27 | DRG: 444 | Disposition: A | Discharge status: Other Acute Inpatient Hospital | Payer: Medicare Other | Attending: Family Medicine | Admitting: Family Medicine

## 2020-10-25 DIAGNOSIS — I2510 Atherosclerotic heart disease of native coronary artery without angina pectoris: Secondary | ICD-10-CM | POA: Diagnosis present

## 2020-10-25 DIAGNOSIS — E872 Acidosis, unspecified: Secondary | ICD-10-CM | POA: Diagnosis present

## 2020-10-25 DIAGNOSIS — R945 Abnormal results of liver function studies: Secondary | ICD-10-CM | POA: Diagnosis not present

## 2020-10-25 DIAGNOSIS — R7881 Bacteremia: Secondary | ICD-10-CM | POA: Diagnosis present

## 2020-10-25 DIAGNOSIS — I130 Hypertensive heart and chronic kidney disease with heart failure and stage 1 through stage 4 chronic kidney disease, or unspecified chronic kidney disease: Secondary | ICD-10-CM | POA: Diagnosis not present

## 2020-10-25 DIAGNOSIS — R531 Weakness: Secondary | ICD-10-CM

## 2020-10-25 DIAGNOSIS — W19XXXA Unspecified fall, initial encounter: Secondary | ICD-10-CM

## 2020-10-25 DIAGNOSIS — Z825 Family history of asthma and other chronic lower respiratory diseases: Secondary | ICD-10-CM

## 2020-10-25 DIAGNOSIS — U099 Post covid-19 condition, unspecified: Secondary | ICD-10-CM | POA: Diagnosis not present

## 2020-10-25 DIAGNOSIS — K2900 Acute gastritis without bleeding: Secondary | ICD-10-CM | POA: Diagnosis present

## 2020-10-25 DIAGNOSIS — I5033 Acute on chronic diastolic (congestive) heart failure: Secondary | ICD-10-CM | POA: Diagnosis not present

## 2020-10-25 DIAGNOSIS — Z743 Need for continuous supervision: Secondary | ICD-10-CM | POA: Diagnosis not present

## 2020-10-25 DIAGNOSIS — U071 COVID-19: Secondary | ICD-10-CM | POA: Diagnosis present

## 2020-10-25 DIAGNOSIS — I4819 Other persistent atrial fibrillation: Secondary | ICD-10-CM | POA: Diagnosis present

## 2020-10-25 DIAGNOSIS — M8660 Other chronic osteomyelitis, unspecified site: Secondary | ICD-10-CM | POA: Diagnosis not present

## 2020-10-25 DIAGNOSIS — J9601 Acute respiratory failure with hypoxia: Secondary | ICD-10-CM | POA: Diagnosis not present

## 2020-10-25 DIAGNOSIS — B9620 Unspecified Escherichia coli [E. coli] as the cause of diseases classified elsewhere: Secondary | ICD-10-CM | POA: Diagnosis present

## 2020-10-25 DIAGNOSIS — Z7901 Long term (current) use of anticoagulants: Secondary | ICD-10-CM

## 2020-10-25 DIAGNOSIS — Z823 Family history of stroke: Secondary | ICD-10-CM

## 2020-10-25 DIAGNOSIS — R7401 Elevation of levels of liver transaminase levels: Secondary | ICD-10-CM | POA: Diagnosis present

## 2020-10-25 DIAGNOSIS — Z809 Family history of malignant neoplasm, unspecified: Secondary | ICD-10-CM | POA: Diagnosis not present

## 2020-10-25 DIAGNOSIS — Z66 Do not resuscitate: Secondary | ICD-10-CM | POA: Diagnosis present

## 2020-10-25 DIAGNOSIS — K8020 Calculus of gallbladder without cholecystitis without obstruction: Secondary | ICD-10-CM | POA: Diagnosis not present

## 2020-10-25 DIAGNOSIS — I5032 Chronic diastolic (congestive) heart failure: Secondary | ICD-10-CM | POA: Diagnosis present

## 2020-10-25 DIAGNOSIS — Z8249 Family history of ischemic heart disease and other diseases of the circulatory system: Secondary | ICD-10-CM

## 2020-10-25 DIAGNOSIS — M47812 Spondylosis without myelopathy or radiculopathy, cervical region: Secondary | ICD-10-CM | POA: Diagnosis present

## 2020-10-25 DIAGNOSIS — Z880 Allergy status to penicillin: Secondary | ICD-10-CM

## 2020-10-25 DIAGNOSIS — Z7982 Long term (current) use of aspirin: Secondary | ICD-10-CM

## 2020-10-25 DIAGNOSIS — R296 Repeated falls: Secondary | ICD-10-CM | POA: Diagnosis not present

## 2020-10-25 DIAGNOSIS — A419 Sepsis, unspecified organism: Secondary | ICD-10-CM

## 2020-10-25 DIAGNOSIS — Z833 Family history of diabetes mellitus: Secondary | ICD-10-CM

## 2020-10-25 DIAGNOSIS — E876 Hypokalemia: Secondary | ICD-10-CM | POA: Diagnosis present

## 2020-10-25 DIAGNOSIS — K8051 Calculus of bile duct without cholangitis or cholecystitis with obstruction: Principal | ICD-10-CM | POA: Diagnosis present

## 2020-10-25 DIAGNOSIS — R110 Nausea: Secondary | ICD-10-CM

## 2020-10-25 DIAGNOSIS — N183 Chronic kidney disease, stage 3 unspecified: Secondary | ICD-10-CM | POA: Diagnosis present

## 2020-10-25 DIAGNOSIS — I10 Essential (primary) hypertension: Secondary | ICD-10-CM | POA: Diagnosis not present

## 2020-10-25 DIAGNOSIS — M810 Age-related osteoporosis without current pathological fracture: Secondary | ICD-10-CM | POA: Diagnosis present

## 2020-10-25 DIAGNOSIS — R6889 Other general symptoms and signs: Secondary | ICD-10-CM | POA: Diagnosis not present

## 2020-10-25 DIAGNOSIS — M86171 Other acute osteomyelitis, right ankle and foot: Secondary | ICD-10-CM | POA: Diagnosis not present

## 2020-10-25 DIAGNOSIS — I517 Cardiomegaly: Secondary | ICD-10-CM | POA: Diagnosis not present

## 2020-10-25 DIAGNOSIS — R17 Unspecified jaundice: Secondary | ICD-10-CM | POA: Diagnosis not present

## 2020-10-25 DIAGNOSIS — M009 Pyogenic arthritis, unspecified: Secondary | ICD-10-CM | POA: Diagnosis present

## 2020-10-25 DIAGNOSIS — S0003XA Contusion of scalp, initial encounter: Secondary | ICD-10-CM | POA: Diagnosis not present

## 2020-10-25 DIAGNOSIS — Z87891 Personal history of nicotine dependence: Secondary | ICD-10-CM

## 2020-10-25 DIAGNOSIS — Z8739 Personal history of other diseases of the musculoskeletal system and connective tissue: Secondary | ICD-10-CM | POA: Diagnosis present

## 2020-10-25 DIAGNOSIS — N1831 Chronic kidney disease, stage 3a: Secondary | ICD-10-CM | POA: Diagnosis present

## 2020-10-25 DIAGNOSIS — R0602 Shortness of breath: Secondary | ICD-10-CM

## 2020-10-25 DIAGNOSIS — Z8616 Personal history of COVID-19: Secondary | ICD-10-CM

## 2020-10-25 DIAGNOSIS — K8050 Calculus of bile duct without cholangitis or cholecystitis without obstruction: Secondary | ICD-10-CM | POA: Diagnosis not present

## 2020-10-25 DIAGNOSIS — M86671 Other chronic osteomyelitis, right ankle and foot: Secondary | ICD-10-CM | POA: Diagnosis not present

## 2020-10-25 DIAGNOSIS — F419 Anxiety disorder, unspecified: Secondary | ICD-10-CM | POA: Diagnosis present

## 2020-10-25 DIAGNOSIS — R0902 Hypoxemia: Secondary | ICD-10-CM | POA: Diagnosis not present

## 2020-10-25 DIAGNOSIS — I739 Peripheral vascular disease, unspecified: Secondary | ICD-10-CM | POA: Diagnosis present

## 2020-10-25 DIAGNOSIS — M869 Osteomyelitis, unspecified: Secondary | ICD-10-CM | POA: Diagnosis present

## 2020-10-25 DIAGNOSIS — R1111 Vomiting without nausea: Secondary | ICD-10-CM | POA: Diagnosis not present

## 2020-10-25 DIAGNOSIS — S199XXA Unspecified injury of neck, initial encounter: Secondary | ICD-10-CM | POA: Diagnosis not present

## 2020-10-25 DIAGNOSIS — Z79899 Other long term (current) drug therapy: Secondary | ICD-10-CM

## 2020-10-25 DIAGNOSIS — K219 Gastro-esophageal reflux disease without esophagitis: Secondary | ICD-10-CM | POA: Diagnosis present

## 2020-10-25 DIAGNOSIS — S91302A Unspecified open wound, left foot, initial encounter: Secondary | ICD-10-CM | POA: Diagnosis not present

## 2020-10-25 LAB — CBC WITH DIFFERENTIAL/PLATELET
Abs Immature Granulocytes: 0.11 10*3/uL — ABNORMAL HIGH (ref 0.00–0.07)
Basophils Absolute: 0.1 10*3/uL (ref 0.0–0.1)
Basophils Relative: 0 %
Eosinophils Absolute: 0 10*3/uL (ref 0.0–0.5)
Eosinophils Relative: 0 %
HCT: 35.3 % — ABNORMAL LOW (ref 36.0–46.0)
Hemoglobin: 11.9 g/dL — ABNORMAL LOW (ref 12.0–15.0)
Immature Granulocytes: 1 %
Lymphocytes Relative: 2 %
Lymphs Abs: 0.3 10*3/uL — ABNORMAL LOW (ref 0.7–4.0)
MCH: 33.8 pg (ref 26.0–34.0)
MCHC: 33.7 g/dL (ref 30.0–36.0)
MCV: 100.3 fL — ABNORMAL HIGH (ref 80.0–100.0)
Monocytes Absolute: 0.6 10*3/uL (ref 0.1–1.0)
Monocytes Relative: 4 %
Neutro Abs: 14.1 10*3/uL — ABNORMAL HIGH (ref 1.7–7.7)
Neutrophils Relative %: 93 %
Platelets: 258 10*3/uL (ref 150–400)
RBC: 3.52 MIL/uL — ABNORMAL LOW (ref 3.87–5.11)
RDW: 14.2 % (ref 11.5–15.5)
WBC: 15.2 10*3/uL — ABNORMAL HIGH (ref 4.0–10.5)
nRBC: 0 % (ref 0.0–0.2)

## 2020-10-25 LAB — COMPREHENSIVE METABOLIC PANEL
ALT: 505 U/L — ABNORMAL HIGH (ref 0–44)
AST: 609 U/L — ABNORMAL HIGH (ref 15–41)
Albumin: 3 g/dL — ABNORMAL LOW (ref 3.5–5.0)
Alkaline Phosphatase: 327 U/L — ABNORMAL HIGH (ref 38–126)
Anion gap: 13 (ref 5–15)
BUN: 19 mg/dL (ref 8–23)
CO2: 23 mmol/L (ref 22–32)
Calcium: 9.2 mg/dL (ref 8.9–10.3)
Chloride: 101 mmol/L (ref 98–111)
Creatinine, Ser: 0.82 mg/dL (ref 0.44–1.00)
GFR, Estimated: >60 mL/min (ref >60)
Glucose, Bld: 166 mg/dL — ABNORMAL HIGH (ref 70–99)
Potassium: 3.3 mmol/L — ABNORMAL LOW (ref 3.5–5.1)
Sodium: 137 mmol/L (ref 135–145)
Total Bilirubin: 2.4 mg/dL — ABNORMAL HIGH (ref 0.3–1.2)
Total Protein: 6 g/dL — ABNORMAL LOW (ref 6.5–8.1)

## 2020-10-25 LAB — BLOOD CULTURE ID PANEL (REFLEXED) - BCID2

## 2020-10-25 LAB — MAGNESIUM: Magnesium: 1.6 mg/dL — ABNORMAL LOW (ref 1.7–2.4)

## 2020-10-25 LAB — URINALYSIS, COMPLETE (UACMP) WITH MICROSCOPIC
Bacteria, UA: NONE SEEN
Glucose, UA: NEGATIVE mg/dL
Hgb urine dipstick: NEGATIVE
Ketones, ur: NEGATIVE mg/dL
Leukocytes,Ua: NEGATIVE
Nitrite: NEGATIVE
Protein, ur: 30 mg/dL — AB
Specific Gravity, Urine: 1.028 (ref 1.005–1.030)
pH: 6 (ref 5.0–8.0)

## 2020-10-25 LAB — LACTIC ACID, PLASMA
Lactic Acid, Venous: 3.4 mmol/L (ref 0.5–1.9)
Lactic Acid, Venous: 2.6 mmol/L (ref 0.5–1.9)
Lactic Acid, Venous: 1.9 mmol/L (ref 0.5–1.9)
Lactic Acid, Venous: 2.3 mmol/L (ref 0.5–1.9)

## 2020-10-25 LAB — RESP PANEL BY RT-PCR (FLU A&B, COVID) ARPGX2
Influenza A by PCR: NEGATIVE
Influenza B by PCR: NEGATIVE
SARS Coronavirus 2 by RT PCR: POSITIVE — AB

## 2020-10-25 LAB — CK: Total CK: 74 U/L (ref 38–234)

## 2020-10-25 LAB — TROPONIN I (HIGH SENSITIVITY)
Troponin I (High Sensitivity): 14 ng/L (ref <18)
Troponin I (High Sensitivity): 14 ng/L (ref <18)

## 2020-10-25 LAB — LIPASE, BLOOD: Lipase: 25 U/L (ref 11–51)

## 2020-10-25 MED ORDER — SILVER SULFADIAZINE 1 % EX CREA
1.0000 "application " | TOPICAL_CREAM | Freq: Every day | CUTANEOUS | Status: DC
  Administered 2020-10-25 – 2020-10-26 (×2): 1 via TOPICAL
  Filled 2020-10-25 (×2): qty 20
  Filled 2020-10-25: qty 85

## 2020-10-25 MED ORDER — AMIODARONE HCL 200 MG PO TABS
100.0000 mg | ORAL_TABLET | Freq: Every day | ORAL | Status: DC
  Administered 2020-10-25 – 2020-10-27 (×3): 100 mg via ORAL
  Filled 2020-10-25 (×3): qty 1

## 2020-10-25 MED ORDER — CEFEPIME HCL 2 G IJ SOLR
2.0000 g | Freq: Once | INTRAMUSCULAR | Status: AC
  Administered 2020-10-25: 2 g via INTRAVENOUS
  Filled 2020-10-25: qty 2

## 2020-10-25 MED ORDER — MEMANTINE HCL 5 MG PO TABS
5.0000 mg | ORAL_TABLET | Freq: Two times a day (BID) | ORAL | Status: DC
  Administered 2020-10-25 – 2020-10-27 (×5): 5 mg via ORAL
  Filled 2020-10-25 (×5): qty 1

## 2020-10-25 MED ORDER — PANTOPRAZOLE SODIUM 40 MG IV SOLR
40.0000 mg | INTRAVENOUS | Status: DC
  Administered 2020-10-25 – 2020-10-27 (×3): 40 mg via INTRAVENOUS
  Filled 2020-10-25 (×3): qty 40

## 2020-10-25 MED ORDER — SODIUM CHLORIDE 0.9 % IV BOLUS
1000.0000 mL | Freq: Once | INTRAVENOUS | Status: AC
  Administered 2020-10-25: 1000 mL via INTRAVENOUS

## 2020-10-25 MED ORDER — METRONIDAZOLE 500 MG/100ML IV SOLN
500.0000 mg | Freq: Once | INTRAVENOUS | Status: AC
  Administered 2020-10-25: 500 mg via INTRAVENOUS
  Filled 2020-10-25: qty 100

## 2020-10-25 MED ORDER — ONDANSETRON HCL 4 MG/2ML IJ SOLN
4.0000 mg | Freq: Four times a day (QID) | INTRAMUSCULAR | Status: DC | PRN
  Administered 2020-10-26: 4 mg via INTRAVENOUS
  Filled 2020-10-25: qty 2

## 2020-10-25 MED ORDER — METRONIDAZOLE 500 MG/100ML IV SOLN: 500.0000 mg | Freq: Three times a day (TID) | INTRAVENOUS | Status: DC

## 2020-10-25 MED ORDER — MAGNESIUM SULFATE 2 GM/50ML IV SOLN
2.0000 g | Freq: Once | INTRAVENOUS | Status: AC
  Administered 2020-10-26: 02:00:00 2 g via INTRAVENOUS
  Filled 2020-10-25: qty 50

## 2020-10-25 MED ORDER — ASPIRIN EC 81 MG PO TBEC
81.0000 mg | DELAYED_RELEASE_TABLET | Freq: Every day | ORAL | Status: DC
  Administered 2020-10-25 – 2020-10-26 (×2): 81 mg via ORAL
  Filled 2020-10-25 (×2): qty 1

## 2020-10-25 MED ORDER — SENNA 8.6 MG PO TABS
1.0000 | ORAL_TABLET | Freq: Every day | ORAL | Status: DC
  Administered 2020-10-25 – 2020-10-26 (×2): 8.6 mg via ORAL
  Filled 2020-10-25 (×2): qty 1

## 2020-10-25 MED ORDER — POTASSIUM CHLORIDE IN NACL 40-0.9 MEQ/L-% IV SOLN
INTRAVENOUS | Status: DC
  Administered 2020-10-27: 05:00:00 125 mL/h via INTRAVENOUS
  Filled 2020-10-25 (×8): qty 1000

## 2020-10-25 MED ORDER — VANCOMYCIN HCL IN DEXTROSE 1-5 GM/200ML-% IV SOLN
1000.0000 mg | Freq: Once | INTRAVENOUS | Status: AC
  Administered 2020-10-25: 1000 mg via INTRAVENOUS
  Filled 2020-10-25: qty 200

## 2020-10-25 MED ORDER — PREDNISOLONE ACETATE 1 % OP SUSP
1.0000 [drp] | Freq: Every day | OPHTHALMIC | Status: DC
  Administered 2020-10-25 – 2020-10-27 (×3): 1 [drp] via OPHTHALMIC
  Filled 2020-10-25 (×2): qty 1

## 2020-10-25 MED ORDER — ONDANSETRON HCL 4 MG PO TABS
4.0000 mg | ORAL_TABLET | Freq: Four times a day (QID) | ORAL | Status: DC | PRN
  Filled 2020-10-25: qty 1

## 2020-10-25 MED ORDER — SODIUM CHLORIDE 0.9 % IV SOLN
2.0000 g | INTRAVENOUS | Status: DC
  Administered 2020-10-26 – 2020-10-27 (×2): 2 g via INTRAVENOUS
  Filled 2020-10-25: qty 20
  Filled 2020-10-25 (×2): qty 2

## 2020-10-25 MED ORDER — GABAPENTIN 300 MG PO CAPS: 400.0000 mg | ORAL_CAPSULE | Freq: Every day | ORAL | Status: DC | PRN

## 2020-10-25 MED ORDER — DULOXETINE HCL 30 MG PO CPEP
60.0000 mg | ORAL_CAPSULE | Freq: Every day | ORAL | Status: DC
  Administered 2020-10-25 – 2020-10-27 (×3): 60 mg via ORAL
  Filled 2020-10-25 (×2): qty 2
  Filled 2020-10-25: qty 1

## 2020-10-25 MED ORDER — LACTATED RINGERS IV SOLN: INTRAVENOUS | Status: DC

## 2020-10-25 MED ORDER — ROPINIROLE HCL 1 MG PO TABS
2.5000 mg | ORAL_TABLET | Freq: Every day | ORAL | Status: DC
  Administered 2020-10-25 – 2020-10-26 (×2): 2.5 mg via ORAL
  Filled 2020-10-25 (×2): qty 3

## 2020-10-25 MED ORDER — PIPERACILLIN-TAZOBACTAM 3.375 G IVPB
3.3750 g | Freq: Three times a day (TID) | INTRAVENOUS | Status: DC
  Administered 2020-10-25 (×2): 3.375 g via INTRAVENOUS
  Filled 2020-10-25 (×2): qty 50

## 2020-10-25 MED ORDER — DOXYCYCLINE HYCLATE 100 MG PO TABS
100.0000 mg | ORAL_TABLET | Freq: Two times a day (BID) | ORAL | Status: DC
  Administered 2020-10-25 – 2020-10-27 (×4): 100 mg via ORAL
  Filled 2020-10-25 (×4): qty 1

## 2020-10-25 MED ORDER — TRAMADOL HCL 50 MG PO TABS
50.0000 mg | ORAL_TABLET | Freq: Every day | ORAL | Status: DC
Start: 2020-10-25 — End: 2020-10-25

## 2020-10-25 MED ORDER — APIXABAN 2.5 MG PO TABS
2.5000 mg | ORAL_TABLET | Freq: Two times a day (BID) | ORAL | Status: DC
  Administered 2020-10-25 – 2020-10-26 (×3): 2.5 mg via ORAL
  Filled 2020-10-25 (×4): qty 1

## 2020-10-25 MED ORDER — GABAPENTIN 300 MG PO CAPS
400.0000 mg | ORAL_CAPSULE | Freq: Every day | ORAL | Status: DC
  Administered 2020-10-25 – 2020-10-26 (×2): 400 mg via ORAL
  Filled 2020-10-25 (×2): qty 1

## 2020-10-25 NOTE — ED Notes (Signed)
Pt. Repositioned with 2 nurse assist. Bed linens changed purewick replaced, brief applied. Fresh warm blankets provided. Denies further need.

## 2020-10-25 NOTE — Progress Notes (Addendum)
PHARMACY - PHYSICIAN COMMUNICATION CRITICAL VALUE ALERT - BLOOD CULTURE IDENTIFICATION (BCID)  [name] is an 87 y.o. female who presented to [facility] on 10/25/2020 with a chief complaint of weakness.   Assessment: 2/4 bottles (aerobic, both sets) Enterobacterales E coli, no resistance detected  Name of physician (or Provider) Contacted: Dr. Eustaquio Boyden  Current antibiotics:  Zosyn - could switch to ceftriaxone  Doxycycline (pt taking for osteomyelitis of R fifth digit; supposed to complete 6 week course and has completed 1 week so far)   Changes to prescribed antibiotics recommended:  Recommendations accepted by provider - change Zosyn to ceftriaxone  Results for orders placed or performed during the hospital encounter of 10/25/20  Blood Culture ID Panel (Reflexed) (Collected: 10/25/2020  8:40 AM)  Result Value Ref Range   Enterococcus faecalis NOT DETECTED NOT DETECTED   Enterococcus Faecium NOT DETECTED NOT DETECTED   Listeria monocytogenes NOT DETECTED NOT DETECTED   Staphylococcus species NOT DETECTED NOT DETECTED   Staphylococcus aureus (BCID) NOT DETECTED NOT DETECTED   Staphylococcus epidermidis NOT DETECTED NOT DETECTED   Staphylococcus lugdunensis NOT DETECTED NOT DETECTED   Streptococcus species NOT DETECTED NOT DETECTED   Streptococcus agalactiae NOT DETECTED NOT DETECTED   Streptococcus pneumoniae NOT DETECTED NOT DETECTED   Streptococcus pyogenes NOT DETECTED NOT DETECTED   A.calcoaceticus-baumannii NOT DETECTED NOT DETECTED   Bacteroides fragilis NOT DETECTED NOT DETECTED   Enterobacterales DETECTED (A) NOT DETECTED   Enterobacter cloacae complex NOT DETECTED NOT DETECTED   Escherichia coli DETECTED (A) NOT DETECTED   Klebsiella aerogenes NOT DETECTED NOT DETECTED   Klebsiella oxytoca NOT DETECTED NOT DETECTED   Klebsiella pneumoniae NOT DETECTED NOT DETECTED   Proteus species NOT DETECTED NOT DETECTED   Salmonella species NOT DETECTED NOT DETECTED    Serratia marcescens NOT DETECTED NOT DETECTED   Haemophilus influenzae NOT DETECTED NOT DETECTED   Neisseria meningitidis NOT DETECTED NOT DETECTED   Pseudomonas aeruginosa NOT DETECTED NOT DETECTED   Stenotrophomonas maltophilia NOT DETECTED NOT DETECTED   Candida albicans NOT DETECTED NOT DETECTED   Candida auris NOT DETECTED NOT DETECTED   Candida glabrata NOT DETECTED NOT DETECTED   Candida krusei NOT DETECTED NOT DETECTED   Candida parapsilosis NOT DETECTED NOT DETECTED   Candida tropicalis NOT DETECTED NOT DETECTED   Cryptococcus neoformans/gattii NOT DETECTED NOT DETECTED   CTX-M ESBL NOT DETECTED NOT DETECTED   Carbapenem resistance IMP NOT DETECTED NOT DETECTED   Carbapenem resistance KPC NOT DETECTED NOT DETECTED   Carbapenem resistance NDM NOT DETECTED NOT DETECTED   Carbapenem resist OXA 48 LIKE NOT DETECTED NOT DETECTED   Carbapenem resistance VIM NOT DETECTED NOT DETECTED    Shalayne Leach O [place] 10/25/2020  10:38 PM

## 2020-10-25 NOTE — ED Notes (Signed)
Messaged Dr. Francine Graven asking whether pt. Needs second Lactic Acid drawn. Will await further instruction.

## 2020-10-25 NOTE — H&P (Signed)
History and Physical    [name] GYJ:[id number] DOB: 1929/06/23 DOA: 10/25/2020  PCP: Ria Bush, MD   Patient coming from: Home  I have personally briefly reviewed patient's old medical records in [place]  Chief Complaint: Weakness  Most of the history was obtained from patient's daughter who was at the bedside.  HPI: [name] is a 87 y.o. female with medical history significant for coronary artery disease, abdominal aortic atherosclerosis, GERD, hypertension, chronic atrial fibrillation on anticoagulation therapy, peripheral arterial disease, osteomyelitis of the right foot on antibiotic therapy with doxycycline, recent COVID-19 viral infection who presents to the emergency room for evaluation of generalized weakness and a fall. Patient's daughter states that she was in her usual state of health until he went to bed last night and in the early hours of the morning the patient had an episode of emesis which she said was mostly bilious.  She woke up this morning to find her mother on the floor and states that the patient had vomited in her bed. Patient denies having any abdominal pain and denies having any changes in her bowel habits.  She denies having any fever or chills. She denies having any chest pain, no shortness of breath, no dizziness, no lightheadedness, no palpitations, no diaphoresis, no headache, no urinary frequency, no dysuria, no hematuria, no blurred vision, no cough or focal deficits. Labs show sodium 137, potassium 3.3, chloride 101, bicarb 23, glucose 166, BUN 19, creatinine 0.82, calcium 9.2, alkaline phosphatase 327, albumin 3.0, lipase 25, AST 609, ALT 505, total protein 6.0, total protein 2.4, troponin 14, lactic acid 3.4, white count 15.2, hemoglobin 11.9, hematocrit 35.3, MCV 100.3, RDW 14.2, platelet count 258                                                                                       Patient SARS coronavirus 2 point-of-care test  is positive Chest x-ray reviewed by me shows cardiomegaly.  No pulmonary venous congestion. CT scan of the cervical spine/CT scan of the head without contrast shows no acute intracranial findings.No evidence of acute traumatic injury to the cervical spine. Moderate chronic microvascular ischemic changes and cerebral volume loss. Multilevel cervical spondylosis, similar to prior. Left foot x-ray shows soft tissue changes without acute erosive change to suggest Osteomyelitis. Right foot x ray shows erosive changes noted about the distal aspect of the right fifth metatarsal and at the base of the proximal phalanx of the right fifth digit. Findings consistent with septic arthritis/osteomyelitis. Gallbladder ultrasound shows prominent gallstone and sludge ball within the gallbladder noted. Gallbladder wall thickness normal. No biliary distention.    ED Course: Patient is an 87 year old Caucasian female who was brought into the ER for evaluation after she was found on the floor by her daughter covered in emesis. Labs show hypokalemia, transaminitis, elevated lactic acid level and leukocytosis. Patient was recently treated for COVID-19 infection with Paxlovid. She will be admitted to the hospital for further evaluation.    Review of Systems: As per HPI otherwise all other systems reviewed and negative.    Past Medical History:  Diagnosis Date   Abdominal aortic atherosclerosis (  [place]) 09/2015   by xray   Anxiety    BCC (basal cell carcinoma of skin) 05/2011   on nose   BCC (basal cell carcinoma of skin) 08/09/2020   upper chest   Breast lesion    a. diagnosed with complex sclerosing lesion with calcifications of the right breast in 08/2016   Chest pain    a. nuclear stress test 03/2012: normal; b. 09/2018 MV: EF >65%, no isch/scar.    DDD (degenerative disc disease), lumbosacral 12/06/1992   Gallstones 09/2015   incidentally by xray   GERD (gastroesophageal reflux disease) 05/1999   HERPES  ZOSTER 04/13/2007   Qualifier: Diagnosis of  By: [name], [name]    Hiatal hernia    History of shingles    ophthalmic, takes acyclovir daily   Hyperlipidemia 12/2003   Hypertension 1990   Mitral regurgitation    a. echo 03/2012: EF 60-65%, no RWMA, mild to mod AI/MR, mod TR, PASP 37 mmHg   Osteoarthritis 08/21/1989   Osteoporosis with fracture 11/1997   with compression fracture T12   Persistent atrial fibrillation ([place]) 03/16/2012   a. CHADS2VASc => 5 (HTN, age x 2, vascular disease, sex category)-->Eliquis 5 BID;  b. Recurrent AF in 06/2016;  c. 01/2017 s/p DCCV;  d. 02/2017 recurrent AFib->Amio added->DCCV; e. 03/2017 recurrent AFib, s/p reload of amio and DCCV 04/02/2017   POSTHERPETIC NEURALGIA 04/20/2007   Qualifier: Diagnosis of  By: [name], Billie-Lynn Daniels    Rheumatoid arthritis ([place])    RLS (restless legs syndrome)     Past Surgical History:  Procedure Laterality Date   ABDOMINAL HYSTERECTOMY  Age 23 - 8   S/P TAH   abdominal ultrasound  04/23/2004   Gallstones   BREAST EXCISIONAL BIOPSY Right 2018   neg/benign complex sclerosing lesion   BREAST LUMPECTOMY WITH RADIOACTIVE SEED LOCALIZATION Right 10/2016   breast lumpectomy with radioactive seed localization and margin assessment for complex sclerosing lesion ([place])   BREAST LUMPECTOMY WITH RADIOACTIVE SEED LOCALIZATION Right 11/05/2016   Procedure: RIGHT BREAST LUMPECTOMY WITH RADIOACTIVE SEED LOCALIZATION;  Surgeon: Fanny Skates, MD;  Location: [facility];  Service: General;  Laterality: Right;   CARDIAC CATHETERIZATION     CARDIOVERSION N/A 01/31/2017   Procedure: CARDIOVERSION;  Surgeon: Wellington Hampshire, MD;  Location: [facility];  Service: Cardiovascular;  Laterality: N/A;   CARDIOVERSION N/A 03/03/2017   Procedure: CARDIOVERSION;  Surgeon: Wellington Hampshire, MD;  Location: [facility];  Service: Cardiovascular;  Laterality: N/A;   CARDIOVERSION N/A 04/02/2017   Procedure:  CARDIOVERSION;  Surgeon: Wellington Hampshire, MD;  Location: [facility];  Service: Cardiovascular;  Laterality: N/A;   CARDIOVERSION N/A 01/16/2018   Procedure: CARDIOVERSION;  Surgeon: Wellington Hampshire, MD;  Location: [facility];  Service: Cardiovascular;  Laterality: N/A;   CATARACT EXTRACTION  05/2010   left eye   CERVICAL DISCECTOMY  1992   Fusion (Dr. Joya Salm)   ESI Bilateral 01/2016   S1 transforaminal ESI x3   ESOPHAGOGASTRODUODENOSCOPY  01/01/2002   with ulcer, bx. neg; + stricture gastric ulcer with hemorrhage   ESOPHAGOGASTRODUODENOSCOPY  04/23/2004   Esophageal stricture -- dilated   INTRAVASCULAR PRESSURE WIRE/FFR STUDY N/A 07/12/2019   Procedure: INTRAVASCULAR PRESSURE WIRE/FFR STUDY;  Surgeon: Wellington Hampshire, MD;  Location: [facility];  Service: Cardiovascular;  Laterality: N/A;   LOWER EXTREMITY ANGIOGRAPHY Left 02/01/2019   LOWER EXTREMITY ANGIOGRAPHY;  Surgeon: Algernon Huxley, MD   LOWER EXTREMITY ANGIOGRAPHY Right 03/25/2019  Procedure: LOWER EXTREMITY ANGIOGRAPHY;  Surgeon: Algernon Huxley, MD;  Location: [facility];  Service: Cardiovascular;  Laterality: Right;   LOWER EXTREMITY ANGIOGRAPHY Right 08/02/2019   Procedure: LOWER EXTREMITY ANGIOGRAPHY;  Surgeon: Algernon Huxley, MD;  Location: [facility];  Service: Cardiovascular;  Laterality: Right;   nuclear stress test  03/2012   no ischemia   PERIPHERAL VASCULAR BALLOON ANGIOPLASTY Left 01/2019   Percutaneous transluminal angioplasty of left anterior and posterior tibial artery with 2.5 mm diameter by 22 cm length angioplasty balloon ([place])   RIGHT/LEFT HEART CATH AND CORONARY ANGIOGRAPHY Bilateral 07/12/2019   Procedure: RIGHT/LEFT HEART CATH AND CORONARY ANGIOGRAPHY;  Surgeon: Wellington Hampshire, MD;  Location: [facility];  Service: Cardiovascular;  Laterality: Bilateral;   SKIN CANCER EXCISION  05/20/2011   BCC from nose   US ECHOCARDIOGRAPHY  03/2012   in fl[place], EF 60%, mild-mod aortic,  mitral, tricuspid regurg, mildly dilated LA     reports that she quit smoking about 44 years ago. Her smoking use included cigarettes. She has a 0.25 pack-year smoking history. She has never used smokeless tobacco. She reports that she does not drink alcohol and does not use drugs.  Allergies  Allergen Reactions   Penicillins Rash and Other (See Comments)    Childhood Allergy Has patient had a PCN reaction causing immediate rash, facial/tongue/throat swelling, SOB or lightheadedness with hypotension: Yes Has patient had a PCN reaction causing severe rash involving mucus membranes or skin necrosis: Unknown Has patient had a PCN reaction that required hospitalization: No Has patient had a PCN reaction occurring within the last 10 years: No If all of the above answers are "NO", then may proceed with Cephalosporin use.     Family History  Problem Relation Age of Onset   Emphysema Mother    Heart disease Father        MI   Stroke Father        first at age 29.   Stroke Sister    Diabetes Sister    Hypertension Brother    Heart disease Brother        Heart Valve   Stroke Brother    Diabetes Brother    Cancer Brother        esophageal   Diabetes Brother    Diabetes Brother    Colon cancer Neg Hx       Prior to Admission medications   Medication Sig Start Date End Date Taking? Authorizing Provider  acetaminophen (TYLENOL) 500 MG tablet Take 1,000 mg every 8 (eight) hours as needed by mouth for moderate pain.     [provider]  acyclovir (ZOVIRAX) 400 MG tablet Take 400 mg by mouth 2 (two) times daily.    [provider]  Alpha-Lipoic Acid 600 MG TABS Take by mouth.    [provider]  amiodarone (PACERONE) 200 MG tablet TAKE 1/2 TABLET (100MG ) BY MOUTH ONCE DAILY 06/28/20   Wellington Hampshire, MD  aspirin EC 81 MG tablet Take 1 tablet (81 mg total) by mouth daily. 02/01/19   Algernon Huxley, MD  atorvastatin (LIPITOR) 20 MG tablet TAKE 1 TABLET BY MOUTH  ONCE DAILY 04/27/20   Ria Bush, MD  ciclopirox (LOPROX) 0.77 % cream Apply topically 2 (two) times daily. Apply to entire foot including between toes of both feet 08/09/20   Moye, [place], MD  doxycycline (VIBRA-TABS) 100 MG tablet Take 1 tablet (100 mg total) by mouth 2 (two)  times daily. 10/10/20   Venia Carbon, MD  DULoxetine (CYMBALTA) 60 MG capsule TAKE 1 CAPSULE BY MOUTH ONCE DAILY 04/28/20   Ria Bush, MD  ELIQUIS 2.5 MG TABS tablet TAKE 1 TABLET BY MOUTH TWICE DAILY 05/23/20   Wellington Hampshire, MD  gabapentin (NEURONTIN) 400 MG capsule Take 1 capsule (400 mg total) by mouth at bedtime. With second dose during day as needed 08/18/20   Ria Bush, MD  HYDROcodone-acetaminophen (NORCO/VICODIN) 5-325 MG tablet Take 1 tablet by mouth 3 (three) times daily as needed for moderate pain (sedation precautions). Patient taking differently: Take 1 tablet by mouth 3 (three) times daily as needed for moderate pain (sedation precautions). Taking 1/2 tablet. 10/14/20   Ria Bush, MD  lisinopril (ZESTRIL) 10 MG tablet TAKE 1 TABLET BY MOUTH ONCE DAILY 10/14/20   Ria Bush, MD  memantine [facility]) 5 MG tablet TAKE 1 TABLET BY MOUTH TWICE DAILY 04/07/20   Ria Bush, MD  metoprolol tartrate (LOPRESSOR) 25 MG tablet TAKE 1/2 TABLET BY MOUTH TWICE DAILY 01/13/20   Wellington Hampshire, MD  pantoprazole (PROTONIX) 40 MG tablet TAKE 1 TABLET BY MOUTH EVERY DAY 04/27/20   Ria Bush, MD  polyethylene glycol (MIRALAX / GLYCOLAX) 17 g packet Take 17 g by mouth daily as needed for mild constipation. 10/27/19   Nolberto Hanlon, MD  prednisoLONE acetate (PRED FORTE) 1 % ophthalmic suspension Place 1 drop into the left eye daily.     [provider]  rOPINIRole (REQUIP) 1 MG tablet Take 2.5 tablets (2.5 mg total) by mouth at bedtime. 03/30/20   Ria Bush, MD  senna (SENOKOT) 8.6 MG TABS tablet Take 1 tablet by mouth.    [provider]  silver sulfADIAZINE  (SILVADENE) 1 % cream Apply 1 application topically daily. 10/10/20   Venia Carbon, MD  tiZANidine (ZANAFLEX) 2 MG tablet Take by mouth daily.    [provider]  traMADol (ULTRAM) 50 MG tablet Take 50 mg by mouth daily. Takes 2 tablets daily 12/04/17   Ria Bush, MD  Vitamin D, Ergocalciferol, (DRISDOL) 1.25 MG (50000 UNIT) CAPS capsule TAKE 1 CAPSULE BY MOUTH ONCE WEEKLY 04/25/20   Abner Greenspan, MD    Physical Exam: Vitals:   10/25/20 0729 10/25/20 0821 10/25/20 0922 10/25/20 1030  BP: 110/65 112/71 (!) 108/59 124/64  Pulse: 89 87 85 65  Resp: 17 17 17 13   Temp: 98.9 F (37.2 C)     SpO2: 95% 98% 97% 98%     Vitals:   10/25/20 0729 10/25/20 0821 10/25/20 0922 10/25/20 1030  BP: 110/65 112/71 (!) 108/59 124/64  Pulse: 89 87 85 65  Resp: 17 17 17 13   Temp: 98.9 F (37.2 C)     SpO2: 95% 98% 97% 98%      Constitutional: Alert and oriented x 3 . Not in any apparent distress HEENT:      Head: Normocephalic and atraumatic.         Eyes: PERLA, EOMI, Conjunctivae are normal. Sclera is non-icteric.       Mouth/Throat: Mucous membranes are moist.       Neck: Supple with no signs of meningismus. Cardiovascular: Regular rate and rhythm. No murmurs, gallops, or rubs. 2+ symmetrical distal pulses are present . No JVD. No LE edema Respiratory: Respiratory effort normal .Lungs sounds clear bilaterally. No wheezes, crackles, or rhonchi.  Gastrointestinal: Soft, non tender, and non distended with positive bowel sounds.  Genitourinary: No CVA tenderness. Musculoskeletal: Nontender with  normal range of motion in all extremities.  Redness involving both lower extremities, cool to touch. Dressing over the lateral portion of the right foot as well as over the anterior right tibia Neurologic:  Face is symmetric. Moving all extremities. No gross focal neurologic deficits . Skin: Skin is warm, dry.  No rash or ulcers, ecchymosis over both lower extremities from frequent  falls. Psychiatric: Mood and affect are normal    Labs on Admission: I have personally reviewed following labs and imaging studies  CBC: Recent Labs  Lab 10/25/20 0720  WBC 15.2*  NEUTROABS 14.1*  HGB 11.9*  HCT 35.3*  MCV 100.3*  PLT 620   Basic Metabolic Panel: Recent Labs  Lab 10/25/20 0720  NA 137  K 3.3*  CL 101  CO2 23  GLUCOSE 166*  BUN 19  CREATININE 0.82  CALCIUM 9.2   GFR: CrCl cannot be calculated (Unknown ideal weight.). Liver Function Tests: Recent Labs  Lab 10/25/20 0720  AST 609*  ALT 505*  ALKPHOS 327*  BILITOT 2.4*  PROT 6.0*  ALBUMIN 3.0*   Recent Labs  Lab 10/25/20 0906  LIPASE 25   No results for input(s): AMMONIA in the last 168 hours. Coagulation Profile: No results for input(s): INR, PROTIME in the last 168 hours. Cardiac Enzymes: No results for input(s): CKTOTAL, CKMB, CKMBINDEX, TROPONINI in the last 168 hours. BNP (last 3 results) No results for input(s): PROBNP in the last 8760 hours. HbA1C: No results for input(s): HGBA1C in the last 72 hours. CBG: No results for input(s): GLUCAP in the last 168 hours. Lipid Profile: No results for input(s): CHOL, HDL, LDLCALC, TRIG, CHOLHDL, LDLDIRECT in the last 72 hours. Thyroid Function Tests: No results for input(s): TSH, T4TOTAL, FREET4, T3FREE, THYROIDAB in the last 72 hours. Anemia Panel: No results for input(s): VITAMINB12, FOLATE, FERRITIN, TIBC, IRON, RETICCTPCT in the last 72 hours. Urine analysis:    Component Value Date/Time   COLORURINE AMBER (A) 10/25/2020 1000   APPEARANCEUR CLEAR (A) 10/25/2020 1000   LABSPEC 1.028 10/25/2020 1000   PHURINE 6.0 10/25/2020 1000   GLUCOSEU NEGATIVE 10/25/2020 1000   HGBUR NEGATIVE 10/25/2020 1000   BILIRUBINUR MODERATE (A) 10/25/2020 1000   BILIRUBINUR negative 05/05/2019 1716   KETONESUR NEGATIVE 10/25/2020 1000   PROTEINUR 30 (A) 10/25/2020 1000   UROBILINOGEN 0.2 05/05/2019 1716   NITRITE NEGATIVE 10/25/2020 1000    LEUKOCYTESUR NEGATIVE 10/25/2020 1000    Radiological Exams on Admission: DG Chest 1 View  Result Date: 10/25/2020 CLINICAL DATA:  Weakness. EXAM: CHEST  1 VIEW COMPARISON:  01/19/2020.  CT 07/06/2019. FINDINGS: Cardiomegaly. No pulmonary venous congestion. Low lung volumes. Mild left base atelectasis/scarring. No focal infiltrate. No pleural effusion or pneumothorax. Degenerative change and scoliosis thoracic spine. Reference is made to prior chest CT report for discussion of thoracic spine compression fractures. Surgical clips right chest IMPRESSION: 1.  Cardiomegaly.  No pulmonary venous congestion. 2.  No acute pulmonary disease. Electronically Signed   By: Marcello Moores  Register   On: 10/25/2020 08:18   CT Head Wo Contrast  Result Date: 10/25/2020 CLINICAL DATA:  Head trauma, neck trauma EXAM: CT HEAD WITHOUT CONTRAST CT CERVICAL SPINE WITHOUT CONTRAST TECHNIQUE: Multidetector CT imaging of the head and cervical spine was performed following the standard protocol without intravenous contrast. Multiplanar CT image reconstructions of the cervical spine were also generated. COMPARISON:  07/03/2018 FINDINGS: CT HEAD FINDINGS Brain: No evidence of acute infarction, hemorrhage, hydrocephalus, extra-axial collection or mass lesion/mass effect. Moderate low-density changes within  the periventricular and subcortical white matter compatible with chronic microvascular ischemic change. Mild diffuse cerebral volume loss. Vascular: Atherosclerotic calcifications involving the large vessels of the skull base. No unexpected hyperdense vessel. Skull: Normal. Negative for fracture or focal lesion. Sinuses/Orbits: No acute finding. Other: Negative for scalp hematoma. CT CERVICAL SPINE FINDINGS Alignment: Facet joints are aligned without dislocation or traumatic listhesis. Dens and lateral masses are aligned. Straightening of the cervical lordosis. Similar degree of grade 1 anterolisthesis of C3 on C4. Slight retrolisthesis of  C5 on C6. Skull base and vertebrae: No acute fracture. No primary bone lesion or focal pathologic process. Soft tissues and spinal canal: No prevertebral fluid or swelling. No visible canal hematoma. Disc levels: Multilevel degenerative disc disease, most pronounced at C6-7. Multilevel bilateral facet joint arthropathy, slightly more pronounced on the right. No significant interval progression from prior. Upper chest: Included lung apices demonstrate biapical pleuroparenchymal scarring, similar to prior. Other: Bilateral carotid atherosclerosis. IMPRESSION: 1. No acute intracranial findings. 2. No evidence of acute traumatic injury to the cervical spine. 3. Moderate chronic microvascular ischemic changes and cerebral volume loss. 4. Multilevel cervical spondylosis, similar to prior. Electronically Signed   By: Davina Poke D.O.   On: 10/25/2020 08:21   CT Cervical Spine Wo Contrast  Result Date: 10/25/2020 CLINICAL DATA:  Head trauma, neck trauma EXAM: CT HEAD WITHOUT CONTRAST CT CERVICAL SPINE WITHOUT CONTRAST TECHNIQUE: Multidetector CT imaging of the head and cervical spine was performed following the standard protocol without intravenous contrast. Multiplanar CT image reconstructions of the cervical spine were also generated. COMPARISON:  07/03/2018 FINDINGS: CT HEAD FINDINGS Brain: No evidence of acute infarction, hemorrhage, hydrocephalus, extra-axial collection or mass lesion/mass effect. Moderate low-density changes within the periventricular and subcortical white matter compatible with chronic microvascular ischemic change. Mild diffuse cerebral volume loss. Vascular: Atherosclerotic calcifications involving the large vessels of the skull base. No unexpected hyperdense vessel. Skull: Normal. Negative for fracture or focal lesion. Sinuses/Orbits: No acute finding. Other: Negative for scalp hematoma. CT CERVICAL SPINE FINDINGS Alignment: Facet joints are aligned without dislocation or traumatic  listhesis. Dens and lateral masses are aligned. Straightening of the cervical lordosis. Similar degree of grade 1 anterolisthesis of C3 on C4. Slight retrolisthesis of C5 on C6. Skull base and vertebrae: No acute fracture. No primary bone lesion or focal pathologic process. Soft tissues and spinal canal: No prevertebral fluid or swelling. No visible canal hematoma. Disc levels: Multilevel degenerative disc disease, most pronounced at C6-7. Multilevel bilateral facet joint arthropathy, slightly more pronounced on the right. No significant interval progression from prior. Upper chest: Included lung apices demonstrate biapical pleuroparenchymal scarring, similar to prior. Other: Bilateral carotid atherosclerosis. IMPRESSION: 1. No acute intracranial findings. 2. No evidence of acute traumatic injury to the cervical spine. 3. Moderate chronic microvascular ischemic changes and cerebral volume loss. 4. Multilevel cervical spondylosis, similar to prior. Electronically Signed   By: Davina Poke D.O.   On: 10/25/2020 08:21   DG Foot Complete Left  Result Date: 10/25/2020 CLINICAL DATA:  Soft tissue wound, possible osteomyelitis EXAM: LEFT FOOT - COMPLETE 3+ VIEW COMPARISON:  None. FINDINGS: Small soft tissue defect is noted laterally adjacent to the head of the fifth metatarsal. No underlying bony abnormality is seen. No acute fracture or dislocation is noted. IMPRESSION: Soft tissue changes without acute erosive change to suggest osteomyelitis. Electronically Signed   By: Inez Catalina M.D.   On: 10/25/2020 09:16   DG Foot Complete Right  Result Date: 10/25/2020 CLINICAL DATA:  Evaluation for osteomyelitis. EXAM: RIGHT FOOT COMPLETE - 3+ VIEW COMPARISON:  MRI 11/01/2019.  Right foot series 10/23/2019. FINDINGS: Erosive changes noted the distal aspect of the right fifth metatarsal and at the base of the proximal phalanx of the right fifth digit. Findings consistent with septic arthritis/osteomyelitis. No fracture  or dislocation. No radiopaque foreign body. IMPRESSION: Erosive changes noted about the distal aspect of the right fifth metatarsal and at the base of the proximal phalanx of the right fifth digit. Findings consistent with septic arthritis/osteomyelitis. Electronically Signed   By: Marcello Moores  Register   On: 10/25/2020 09:17   US ABDOMEN LIMITED RUQ (LIVER/GB)  Result Date: 10/25/2020 CLINICAL DATA:  Nausea. EXAM: ULTRASOUND ABDOMEN LIMITED RIGHT UPPER QUADRANT COMPARISON:  CT 07/06/2019. FINDINGS: Gallbladder: 1.2 cm gallstone noted. 3.2 cm sludge ball noted. Gallbladder wall thickness normal. No pericholecystic fluid. Common bile duct: Diameter: 4 mm Liver: No focal lesion identified. Within normal limits in parenchymal echogenicity. Portal vein is patent on color Doppler imaging with normal direction of blood flow towards the liver. Other: None. IMPRESSION: Prominent gallstone and sludge ball within the gallbladder noted. Gallbladder wall thickness normal. No biliary distention. Electronically Signed   By: Marcello Moores  Register   On: 10/25/2020 11:03     Assessment/Plan Principal Problem:   Generalized weakness Active Problems:   Essential hypertension   PAD (peripheral artery disease) (HCC)   CAD (coronary artery disease), native coronary artery   CKD (chronic kidney disease) stage 3, GFR 30-59 ml/min (HCC)   Chronic diastolic CHF (congestive heart failure) (HCC)   Osteomyelitis (HCC)   Frequent falls   Transaminitis   Lactic acidosis      Generalized weakness/frequent falls Unclear etiology and may be related to recent COVID-19 infection Place patient on fall precautions PT evaluation Obtain CK levels    Transaminitis Unclear etiology and may be medication induced versus secondary to gallbladder disease Patient has no abdominal pain Hold atorvastatin and acetaminophen for now Continue low-dose amiodarone for A. Fib Patient was recently treated with Paxlovid for COVID-19 viral  infection and according to the daughter they were asked to hold 2 of her medications for couple of days which they did. Patient's are recommended to hold amiodarone and atorvastatin while on Paxlovid. Patient may likely benefit from an MRCP for further evaluation     Atrial fibrillation Continue amiodarone for rate control Continue apixaban as primary prophylaxis for an acute stroke     Osteomyelitis of the right fifth digit Continue doxycycline 100 mg twice daily per podiatry recommendation. Patient is supposed to complete a 6-week course of antibiotic therapy and has only completed 1 week.     Recent COVID-19 viral infection Patient tested positive for COVID-19 viral infection on June 27 and was treated with Paxlovid. She is vaccinated but has not received the booster dose Continue supportive care No need for isolation at this time     Chronic diastolic dysfunction CHF Hold lisinopril and metoprolol for now since patient is normotensive.    History of peripheral vascular disease Continue aspirin Hold metoprolol and statins for now    Acute gastritis Patient with multiple episodes of emesis Supportive care with IV fluid hydration, antiemetics and IV PPI     Hypokalemia Secondary to GI losses from emesis Supplement potassium Check magnesium levels    Lactic acidosis Unclear source ??  Infection from GI source.  Patient denies having any abdominal pain or any changes in her bowel habits.  She is afebrile but has leukocytosis. We  will trend lactic acid levels Will place patient empirically on Zosyn while awaiting results of MRCP     DVT prophylaxis: Apixaban Code Status: DO NOT RESUSCITATE Family Communication: Greater than 50% of time was spent discussing patient's condition and plan of care with her daughter [name], and over the phone.  All questions and concerns have been addressed.  She verbalizes understanding and agrees with the  plan. Disposition Plan: Back to previous home environment Consults called: Surgery/physical therapy Status: At the time of admission, it appears that the appropriate admission status for this patient is inpatient. This is judged to be reasonable and necessary in order to provide the required intensity of service to ensure the patient's safety given the presenting symptoms, physical exam findings, and initial radiographic and laboratory data in the context of their comorbid conditions. Patient requires inpatient status due to high intensity of service, high risk for further deterioration and high frequency of surveillance required.    Collier Bullock MD [facility]     10/25/2020, 1:03 PM

## 2020-10-25 NOTE — Consult Note (Signed)
PHARMACY -  BRIEF ANTIBIOTIC NOTE   Pharmacy has received consult(s) for vancomycin from an ED provider. Patient has already received one time doses of cefepime and metronidazole. The patient's profile has been reviewed for ht/wt/allergies/indication/available labs.    One time order(s) placed for --Vancomycin 1 g   Further antibiotics/pharmacy consults should be ordered by admitting physician if indicated.                       Thank you, Benita Gutter 10/25/2020  11:35 AM

## 2020-10-25 NOTE — ED Provider Notes (Signed)
[facility] Emergency Department Provider Note    Event Date/Time   First MD Initiated Contact with Patient 10/25/20 0700     (approximate)  I have reviewed the triage vital signs and the nursing notes.   HISTORY  Chief Complaint Fall    HPI [name] is a 87 y.o. female with the below listed past medical history presents to the ER for evaluation chief complaint of generalized weakness as well as a fall.  States she not having any pain.  Was found on ground by daughter lives with her at her apartment.  Denies any numbness or tingling.  Past Medical History:  Diagnosis Date   Abdominal aortic atherosclerosis ([place]) 09/2015   by xray   Anxiety    BCC (basal cell carcinoma of skin) 05/2011   on nose   BCC (basal cell carcinoma of skin) 08/09/2020   upper chest   Breast lesion    a. diagnosed with complex sclerosing lesion with calcifications of the right breast in 08/2016   Chest pain    a. nuclear stress test 03/2012: normal; b. 09/2018 MV: EF >65%, no isch/scar.    DDD (degenerative disc disease), lumbosacral 12/06/1992   Gallstones 09/2015   incidentally by xray   GERD (gastroesophageal reflux disease) 05/1999   HERPES ZOSTER 04/13/2007   Qualifier: Diagnosis of  By: [name], [name]    Hiatal hernia    History of shingles    ophthalmic, takes acyclovir daily   Hyperlipidemia 12/2003   Hypertension 1990   Mitral regurgitation    a. echo 03/2012: EF 60-65%, no RWMA, mild to mod AI/MR, mod TR, PASP 37 mmHg   Osteoarthritis 08/21/1989   Osteoporosis with fracture 11/1997   with compression fracture T12   Persistent atrial fibrillation ([place]) 03/16/2012   a. CHADS2VASc => 5 (HTN, age x 2, vascular disease, sex category)-->Eliquis 5 BID;  b. Recurrent AF in 06/2016;  c. 01/2017 s/p DCCV;  d. 02/2017 recurrent AFib->Amio added->DCCV; e. 03/2017 recurrent AFib, s/p reload of amio and DCCV 04/02/2017   POSTHERPETIC NEURALGIA 04/20/2007    Qualifier: Diagnosis of  By: [name], [name]    Rheumatoid arthritis ([place])    RLS (restless legs syndrome)    Family History  Problem Relation Age of Onset   Emphysema Mother    Heart disease Father        MI   Stroke Father        first at age 76.   Stroke Sister    Diabetes Sister    Hypertension Brother    Heart disease Brother        Heart Valve   Stroke Brother    Diabetes Brother    Cancer Brother        esophageal   Diabetes Brother    Diabetes Brother    Colon cancer Neg Hx    Past Surgical History:  Procedure Laterality Date   ABDOMINAL HYSTERECTOMY  Age 89 - 24   S/P TAH   abdominal ultrasound  04/23/2004   Gallstones   BREAST EXCISIONAL BIOPSY Right 2018   neg/benign complex sclerosing lesion   BREAST LUMPECTOMY WITH RADIOACTIVE SEED LOCALIZATION Right 10/2016   breast lumpectomy with radioactive seed localization and margin assessment for complex sclerosing lesion ([place])   BREAST LUMPECTOMY WITH RADIOACTIVE SEED LOCALIZATION Right 11/05/2016   Procedure: RIGHT BREAST LUMPECTOMY WITH RADIOACTIVE SEED LOCALIZATION;  Surgeon: Fanny Skates, MD;  Location: [place];  Service:  General;  Laterality: Right;   CARDIAC CATHETERIZATION     CARDIOVERSION N/A 01/31/2017   Procedure: CARDIOVERSION;  Surgeon: [place], MD;  Location: [facility];  Service: Cardiovascular;  Laterality: N/A;   CARDIOVERSION N/A 03/03/2017   Procedure: CARDIOVERSION;  Surgeon: [place] Hampshire, MD;  Location: [place] [facility];  Service: Cardiovascular;  Laterality: N/A;   CARDIOVERSION N/A 04/02/2017   Procedure: CARDIOVERSION;  Surgeon: [place] Hampshire, MD;  Location: [place] [facility];  Service: Cardiovascular;  Laterality: N/A;   CARDIOVERSION N/A 01/16/2018   Procedure: CARDIOVERSION;  Surgeon: [place] Hampshire, MD;  Location: [facility];  Service: Cardiovascular;  Laterality: N/A;   CATARACT EXTRACTION  05/2010   left eye   CERVICAL DISCECTOMY  1992   Fusion  (Dr. Joya Salm)   ESI Bilateral 01/2016   S1 transforaminal ESI x3   ESOPHAGOGASTRODUODENOSCOPY  01/01/2002   with ulcer, bx. neg; + stricture gastric ulcer with hemorrhage   ESOPHAGOGASTRODUODENOSCOPY  04/23/2004   Esophageal stricture -- dilated   INTRAVASCULAR PRESSURE WIRE/FFR STUDY N/A 07/12/2019   Procedure: INTRAVASCULAR PRESSURE WIRE/FFR STUDY;  Surgeon: [place] Hampshire, MD;  Location: [place] [facility];  Service: Cardiovascular;  Laterality: N/A;   LOWER EXTREMITY ANGIOGRAPHY Left 02/01/2019   LOWER EXTREMITY ANGIOGRAPHY;  Surgeon: Algernon Huxley, MD   LOWER EXTREMITY ANGIOGRAPHY Right 03/25/2019   Procedure: LOWER EXTREMITY ANGIOGRAPHY;  Surgeon: Algernon Huxley, MD;  Location: [place] [facility];  Service: Cardiovascular;  Laterality: Right;   LOWER EXTREMITY ANGIOGRAPHY Right 08/02/2019   Procedure: LOWER EXTREMITY ANGIOGRAPHY;  Surgeon: Algernon Huxley, MD;  Location: [place] [facility];  Service: Cardiovascular;  Laterality: Right;   nuclear stress test  03/2012   no ischemia   PERIPHERAL VASCULAR BALLOON ANGIOPLASTY Left 01/2019   Percutaneous transluminal angioplasty of left anterior and posterior tibial artery with 2.5 mm diameter by 22 cm length angioplasty balloon ([place])   RIGHT/LEFT HEART CATH AND CORONARY ANGIOGRAPHY Bilateral 07/12/2019   Procedure: RIGHT/LEFT HEART CATH AND CORONARY ANGIOGRAPHY;  Surgeon: [place] Hampshire, MD;  Location: [place] [facility];  Service: Cardiovascular;  Laterality: Bilateral;   SKIN CANCER EXCISION  05/20/2011   BCC from nose   US ECHOCARDIOGRAPHY  03/2012   in flutter, EF 60%, mild-mod aortic, mitral, tricuspid regurg, mildly dilated LA   Patient Active Problem List   Diagnosis Date Noted   Nondisplaced fracture of proximal phalanx of right lesser toe(s), initial encounter for closed fracture 10/13/2020   COVID-19 virus infection 10/12/2020   Osteomyelitis of right foot ([place]) 10/10/2020   Skin tear of elbow without complication  14/48/1856   Pedal edema 09/22/2020   IDA (iron deficiency anemia) 06/27/2020   MCI (mild cognitive impairment) with memory loss 12/28/2019   Chronic diastolic CHF (congestive heart failure) ([place]) 10/25/2019   DNR (do not resuscitate) 10/25/2019   Closed fracture of left distal radius and ulna 10/25/2019   Closed fracture of right wrist    Palliative care by specialist    CKD (chronic kidney disease) stage 3, GFR 30-59 ml/min ([place]) 10/24/2019   Ulcer of right foot ([place]) 08/14/2019   CAD (coronary artery disease), native coronary artery 07/13/2019   Dyspnea on exertion    Pulmonary hypertension, unspecified ([place])    Urge incontinence 05/06/2019   Skin lesion on examination 05/06/2019   PAD (peripheral artery disease) ([place]) 03/12/2019   Hypertrophic toenail 01/27/2019   Cyanosis 01/12/2019   Hemorrhoid 11/25/2018   Vitamin B12 deficiency 08/04/2018   Bilateral leg and  foot pain 07/31/2018   Vitamin D deficiency 02/25/2018   Peripheral neuropathy 12/04/2017   General unsteadiness 10/01/2017   Chronic constipation 10/01/2017   Esophageal dysphagia 04/08/2017   Chronic radicular pain of lower back 04/08/2017   Fall with injury 03/04/2017   Encounter for chronic pain management 11/11/2016   Breast mass, right 09/29/2016   Abdominal aortic atherosclerosis ([place]) 09/07/2015   Insomnia 06/08/2015   Health maintenance examination 12/08/2014   Advanced care planning/counseling discussion 12/08/2014   Chronic leg pain 10/13/2014   Fatigue 10/28/2013   Persistent atrial fibrillation ([place]) 04/24/2013   Medicare annual wellness visit, subsequent 04/14/2012   Chest tightness 03/16/2012   RLS (restless legs syndrome) 02/07/2012   DDD (degenerative disc disease), lumbar 12/18/2009   MDD (major depressive disorder), recurrent episode ([place]) 12/10/2007   HLD (hyperlipidemia) 11/24/2006   PLMD (periodic limb movement disorder) 11/24/2006   Essential hypertension 11/24/2006   GERD 11/24/2006    GASTRIC ULCER 11/24/2006   Osteoarthritis 11/24/2006   Osteoporosis 11/24/2006      Prior to Admission medications   Medication Sig Start Date End Date Taking? Authorizing Provider  acetaminophen (TYLENOL) 500 MG tablet Take 1,000 mg every 8 (eight) hours as needed by mouth for moderate pain.     [provider]  acyclovir (ZOVIRAX) 400 MG tablet Take 400 mg by mouth 2 (two) times daily.    [provider]  Alpha-Lipoic Acid 600 MG TABS Take by mouth.    [provider]  amiodarone (PACERONE) 200 MG tablet TAKE 1/2 TABLET (100MG ) BY MOUTH ONCE DAILY 06/28/20   [place] Hampshire, MD  aspirin EC 81 MG tablet Take 1 tablet (81 mg total) by mouth daily. 02/01/19   Algernon Huxley, MD  atorvastatin (LIPITOR) 20 MG tablet TAKE 1 TABLET BY MOUTH ONCE DAILY 04/27/20   Ria Bush, MD  ciclopirox (LOPROX) 0.77 % cream Apply topically 2 (two) times daily. Apply to entire foot including between toes of both feet 08/09/20   Moye, [place], MD  doxycycline (VIBRA-TABS) 100 MG tablet Take 1 tablet (100 mg total) by mouth 2 (two) times daily. 10/10/20   Venia Carbon, MD  DULoxetine (CYMBALTA) 60 MG capsule TAKE 1 CAPSULE BY MOUTH ONCE DAILY 04/28/20   Ria Bush, MD  ELIQUIS 2.5 MG TABS tablet TAKE 1 TABLET BY MOUTH TWICE DAILY 05/23/20   [place] Hampshire, MD  gabapentin (NEURONTIN) 400 MG capsule Take 1 capsule (400 mg total) by mouth at bedtime. With second dose during day as needed 08/18/20   Ria Bush, MD  HYDROcodone-acetaminophen (NORCO/VICODIN) 5-325 MG tablet Take 1 tablet by mouth 3 (three) times daily as needed for moderate pain (sedation precautions). Patient taking differently: Take 1 tablet by mouth 3 (three) times daily as needed for moderate pain (sedation precautions). Taking 1/2 tablet. 10/14/20   Ria Bush, MD  lisinopril (ZESTRIL) 10 MG tablet TAKE 1 TABLET BY MOUTH ONCE DAILY 10/14/20   Ria Bush, MD  memantine [facility]) 5 MG tablet  TAKE 1 TABLET BY MOUTH TWICE DAILY 04/07/20   Ria Bush, MD  metoprolol tartrate (LOPRESSOR) 25 MG tablet TAKE 1/2 TABLET BY MOUTH TWICE DAILY 01/13/20   [place] Hampshire, MD  pantoprazole (PROTONIX) 40 MG tablet TAKE 1 TABLET BY MOUTH EVERY DAY 04/27/20   Ria Bush, MD  polyethylene glycol (MIRALAX / GLYCOLAX) 17 g packet Take 17 g by mouth daily as needed for mild constipation. 10/27/19   Nolberto Hanlon, MD  prednisoLONE acetate (PRED FORTE) 1 %  ophthalmic suspension Place 1 drop into the left eye daily.     [provider]  rOPINIRole (REQUIP) 1 MG tablet Take 2.5 tablets (2.5 mg total) by mouth at bedtime. 03/30/20   Ria Bush, MD  senna (SENOKOT) 8.6 MG TABS tablet Take 1 tablet by mouth.    [provider]  silver sulfADIAZINE (SILVADENE) 1 % cream Apply 1 application topically daily. 10/10/20   Venia Carbon, MD  tiZANidine (ZANAFLEX) 2 MG tablet Take by mouth daily.    [provider]  traMADol (ULTRAM) 50 MG tablet Take 50 mg by mouth daily. Takes 2 tablets daily 12/04/17   Ria Bush, MD  Vitamin D, Ergocalciferol, (DRISDOL) 1.25 MG (50000 UNIT) CAPS capsule TAKE 1 CAPSULE BY MOUTH ONCE WEEKLY 04/25/20   Tower, Wynelle Fanny, MD    Allergies Penicillins    Social History Social History   Tobacco Use   Smoking status: Former    Packs/day: 0.25    Years: 1.00    Pack years: 0.25    Types: Cigarettes    Quit date: 04/08/1976    Years since quitting: 44.5   Smokeless tobacco: Never   Tobacco comments:    pt smokes occasionally "socially"  Vaping Use   Vaping Use: Never used  Substance Use Topics   Alcohol use: No    Alcohol/week: 1.0 standard drink    Types: 1 Glasses of wine per week   Drug use: No    Review of Systems Patient denies headaches, rhinorrhea, blurry vision, numbness, shortness of breath, chest pain, edema, cough, abdominal pain, nausea, vomiting, diarrhea, dysuria, fevers, rashes or hallucinations unless  otherwise stated above in HPI. ____________________________________________   PHYSICAL EXAM:  VITAL SIGNS: Vitals:   10/25/20 0922 10/25/20 1030  BP: (!) 108/59 124/64  Pulse: 85 65  Resp: 17 13  Temp:    SpO2: 97% 98%    Constitutional: Alert chronically ill appearing Eyes: Conjunctivae are normal.  Head: Atraumatic. Nose: No congestion/rhinnorhea. Mouth/Throat: Mucous membranes are moist.   Neck: No stridor. Painless ROM.  Cardiovascular: Normal rate, regular rhythm. Grossly normal heart sounds.  Good peripheral circulation. Respiratory: Normal respiratory effort.  No retractions. Lungs CTAB. Gastrointestinal: Soft and nontender. No distention. No abdominal bruits. No CVA tenderness. Genitourinary: normal external genitalia Musculoskeletal: ble cool to touch with delayed cap refill, dp and pt signals noted bilat.  Scattered abrasion and contusion of ble.  No joint effusions. Neurologic:  Normal speech and language. No gross focal neurologic deficits are appreciated. No facial droop Skin:  Skin is warm, dry and intact. No rash noted. Psychiatric: Mood and affect are normal. Speech and behavior are normal.  ____________________________________________   LABS (all labs ordered are listed, but only abnormal results are displayed)  Results for orders placed or performed during the hospital encounter of 10/25/20 (from the past 24 hour(s))  CBC with Differential/Platelet     Status: Abnormal   Collection Time: 10/25/20  7:20 AM  Result Value Ref Range   WBC 15.2 (H) 4.0 - 10.5 K/uL   RBC 3.52 (L) 3.87 - 5.11 MIL/uL   Hemoglobin 11.9 (L) 12.0 - 15.0 g/dL   HCT 35.3 (L) 36.0 - 46.0 %   MCV 100.3 (H) 80.0 - 100.0 fL   MCH 33.8 26.0 - 34.0 pg   MCHC 33.7 30.0 - 36.0 g/dL   RDW 14.2 11.5 - 15.5 %   Platelets 258 150 - 400 K/uL   nRBC 0.0 0.0 - 0.2 %  Neutrophils Relative % 93 %   Neutro Abs 14.1 (H) 1.7 - 7.7 K/uL   Lymphocytes Relative 2 %   Lymphs Abs 0.3 (L) 0.7 - 4.0  K/uL   Monocytes Relative 4 %   Monocytes Absolute 0.6 0.1 - 1.0 K/uL   Eosinophils Relative 0 %   Eosinophils Absolute 0.0 0.0 - 0.5 K/uL   Basophils Relative 0 %   Basophils Absolute 0.1 0.0 - 0.1 K/uL   Immature Granulocytes 1 %   Abs Immature Granulocytes 0.11 (H) 0.00 - 0.07 K/uL  Comprehensive metabolic panel     Status: Abnormal   Collection Time: 10/25/20  7:20 AM  Result Value Ref Range   Sodium 137 135 - 145 mmol/L   Potassium 3.3 (L) 3.5 - 5.1 mmol/L   Chloride 101 98 - 111 mmol/L   CO2 23 22 - 32 mmol/L   Glucose, Bld 166 (H) 70 - 99 mg/dL   BUN 19 8 - 23 mg/dL   Creatinine, Ser 0.82 0.44 - 1.00 mg/dL   Calcium 9.2 8.9 - 10.3 mg/dL   Total Protein 6.0 (L) 6.5 - 8.1 g/dL   Albumin 3.0 (L) 3.5 - 5.0 g/dL   AST 609 (H) 15 - 41 U/L   ALT 505 (H) 0 - 44 U/L   Alkaline Phosphatase 327 (H) 38 - 126 U/L   Total Bilirubin 2.4 (H) 0.3 - 1.2 mg/dL   GFR, Estimated >60 >60 mL/min   Anion gap 13 5 - 15  Lactic acid, plasma     Status: Abnormal   Collection Time: 10/25/20  7:40 AM  Result Value Ref Range   Lactic Acid, Venous 3.4 (HH) 0.5 - 1.9 mmol/L  Troponin I (High Sensitivity)     Status: None   Collection Time: 10/25/20  7:40 AM  Result Value Ref Range   Troponin I (High Sensitivity) 14 <18 ng/L  Lipase, blood     Status: None   Collection Time: 10/25/20  9:06 AM  Result Value Ref Range   Lipase 25 11 - 51 U/L  Urinalysis, Complete w Microscopic     Status: Abnormal   Collection Time: 10/25/20 10:00 AM  Result Value Ref Range   Color, Urine AMBER (A) YELLOW   APPearance CLEAR (A) CLEAR   Specific Gravity, Urine 1.028 1.005 - 1.030   pH 6.0 5.0 - 8.0   Glucose, UA NEGATIVE NEGATIVE mg/dL   Hgb urine dipstick NEGATIVE NEGATIVE   Bilirubin Urine MODERATE (A) NEGATIVE   Ketones, ur NEGATIVE NEGATIVE mg/dL   Protein, ur 30 (A) NEGATIVE mg/dL   Nitrite NEGATIVE NEGATIVE   Leukocytes,Ua NEGATIVE NEGATIVE   RBC / HPF 0-5 0 - 5 RBC/hpf   WBC, UA 0-5 0 - 5 WBC/hpf    Bacteria, UA NONE SEEN NONE SEEN   Squamous Epithelial / LPF 0-5 0 - 5   Mucus PRESENT    Non Squamous Epithelial PRESENT (A) NONE SEEN   ____________________________________________  EKG My review and personal interpretation at Time: 8:20   Indication: weakness  Rate: 70  Rhythm: sinus Axis: normal Other: normal itnervals, no stemi ____________________________________________  RADIOLOGY  I personally reviewed all radiographic images ordered to evaluate for the above acute complaints and reviewed radiology reports and findings.  These findings were personally discussed with the patient.  Please see medical record for radiology report.  ____________________________________________   PROCEDURES  Procedure(s) performed:  .Critical Care  Date/Time: 10/25/2020 1:21 PM Performed by: Merlyn Lot, MD Authorized by: Merlyn Lot,  MD   Critical care provider statement:    Critical care time (minutes):  32   Critical care was necessary to treat or prevent imminent or life-threatening deterioration of the following conditions:  Sepsis   Critical care was time spent personally by me on the following activities:  Discussions with consultants, evaluation of patient's response to treatment, examination of patient, ordering and performing treatments and interventions, ordering and review of laboratory studies, ordering and review of radiographic studies, pulse oximetry, re-evaluation of patient's condition, obtaining history from patient or surrogate and review of old charts    Critical Care performed: yes ____________________________________________   INITIAL IMPRESSION / ASSESSMENT AND PLAN / ED COURSE  Pertinent labs & imaging results that were available during my care of the patient were reviewed by me and considered in my medical decision making (see chart for details).   DDX: Dehydration, sepsis, pna, uti, hypoglycemia, cva, drug effect, withdrawal  [name] is a  87 y.o. who presents to the ED with presentation as described above.  Patient with extensive complicated past medical history and comorbidities.  Blood will be sent for the blood differential.  Will order imaging.  The patient will be placed on continuous pulse oximetry and telemetry for monitoring.  Laboratory evaluation will be sent to evaluate for the above complaints.     Clinical Course as of 10/25/20 1321  Wed Oct 25, 2020  0906 Patient blood work did come back with leukocytosis as well as elevated lactate.  Vital signs are stable but will order blood cultures as well as empiric antibiotics.  CMP with elevated transaminase.  Does not have any focal abdominal pain will order ultrasound. [PR]  1021 Bilateral lower extremities are cool to touch but have dopplerable PT and DP signals.  Does have slow cap refill.  I have a lower suspicion for septic arthritis possible worsening of osteomyelitis. [PR]  1124 Discussed results of ultrasound with patient and family.  Given her elevated LFTs will consult surgery though ultrasound is without evidence of pericholecystic fluid or dilated CBD.  Based on presentation will discuss case with hospitalist for admission for additional IV antibiotics and further evaluation management. [PR]    Clinical Course User Index [PR] Merlyn Lot, MD    The patient was evaluated in Emergency Department today for the symptoms described in the history of present illness. He/she was evaluated in the context of the global COVID-19 pandemic, which necessitated consideration that the patient might be at risk for infection with the SARS-CoV-2 virus that causes COVID-19. Institutional protocols and algorithms that pertain to the evaluation of patients at risk for COVID-19 are in a state of rapid change based on information released by regulatory bodies including the CDC and federal and state organizations. These policies and algorithms were followed during the patient's care in  the ED.  As part of my medical decision making, I reviewed the following data within the [place] notes reviewed and incorporated, Labs reviewed, notes from prior ED visits and [place] Controlled Substance Database   ____________________________________________   FINAL CLINICAL IMPRESSION(S) / ED DIAGNOSES  Final diagnoses:  Nausea  Sepsis without acute organ dysfunction, due to unspecified organism [facility])      NEW MEDICATIONS STARTED DURING THIS VISIT:  New Prescriptions   No medications on file     Note:  This document was prepared using Dragon voice recognition software and may include unintentional dictation errors.    Merlyn Lot, MD 10/25/20 1322

## 2020-10-25 NOTE — Consult Note (Signed)
PHARMACY -  BRIEF ANTIBIOTIC NOTE   Pharmacy has received consult(s) for Cefepime from an ED provider.  The patient's profile has been reviewed for ht/wt/allergies/indication/available labs.    One time order(s) placed for Cefepime 2g IV x 1 dose.  Further antibiotics/pharmacy consults should be ordered by admitting physician if indicated.                       Thank you, Pearla Dubonnet 10/25/2020  9:18 AM

## 2020-10-25 NOTE — Consult Note (Signed)
[facility] SURGICAL CONSULTATION NOTE (initial) - cpt: 24825   HISTORY OF PRESENT ILLNESS (HPI):  87 y.o. female presented to [facility] ED today for evaluation of fall at home. Patient's daughter reports that she found her mother on the side of the bed this morning wrapped in a blanket holding onto the bed rail. Patient does not recall this well. Does not appear to have any injury from this. She presented to the ED for this. Work up in the ED did reveal leukocytosis to 15K as well as LFT/Alk phos elevation and hyperbilirubinemia to 2.4. RUQ [place] was obtained which showed large 1.2 cm gallstone without pericholecystic changes or CBD dilation. She denied any abdominal pain or post-prandial pain. She did have a an episode of nausea/emesis last night per daughter but this happens from time to time reportedly. No fever, chills, chest pain, SOB, urinary changes, bowel changes, or juandice. Only reported abdominal surgery is hysterectomy over 50 years ago. Gallstones incidentally discovered a few years back per chart review.   Surgery is consulted by emergency medicine physician Dr. Merlyn Lot, MD in this context for evaluation and management of incidental findings of cholelithiasis in setting of fall.   PAST MEDICAL HISTORY (PMH):  Past Medical History:  Diagnosis Date   Abdominal aortic atherosclerosis ([place]) 09/2015   by xray   Anxiety    BCC (basal cell carcinoma of skin) 05/2011   on nose   BCC (basal cell carcinoma of skin) 08/09/2020   upper chest   Breast lesion    a. diagnosed with complex sclerosing lesion with calcifications of the right breast in 08/2016   Chest pain    a. nuclear stress test 03/2012: normal; b. 09/2018 MV: EF >65%, no isch/scar.    DDD (degenerative disc disease), lumbosacral 12/06/1992   Gallstones 09/2015   incidentally by xray   GERD (gastroesophageal reflux disease) 05/1999   HERPES ZOSTER 04/13/2007   Qualifier: Diagnosis of  By: [name], [name]    Hiatal hernia    History of shingles    ophthalmic, takes acyclovir daily   Hyperlipidemia 12/2003   Hypertension 1990   Mitral regurgitation    a. echo 03/2012: EF 60-65%, no RWMA, mild to mod AI/MR, mod TR, PASP 37 mmHg   Osteoarthritis 08/21/1989   Osteoporosis with fracture 11/1997   with compression fracture T12   Persistent atrial fibrillation ([place]) 03/16/2012   a. CHADS2VASc => 5 (HTN, age x 2, vascular disease, sex category)-->Eliquis 5 BID;  b. Recurrent AF in 06/2016;  c. 01/2017 s/p DCCV;  d. 02/2017 recurrent AFib->Amio added->DCCV; e. 03/2017 recurrent AFib, s/p reload of amio and DCCV 04/02/2017   POSTHERPETIC NEURALGIA 04/20/2007   Qualifier: Diagnosis of  By: [name], Billie-Lynn Daniels    Rheumatoid arthritis ([place])    RLS (restless legs syndrome)      PAST SURGICAL HISTORY ([place]):  Past Surgical History:  Procedure Laterality Date   ABDOMINAL HYSTERECTOMY  Age 51 - 57   S/P TAH   abdominal ultrasound  04/23/2004   Gallstones   BREAST EXCISIONAL BIOPSY Right 2018   neg/benign complex sclerosing lesion   BREAST LUMPECTOMY WITH RADIOACTIVE SEED LOCALIZATION Right 10/2016   breast lumpectomy with radioactive seed localization and margin assessment for complex sclerosing lesion Renelda Loma)   BREAST LUMPECTOMY WITH RADIOACTIVE SEED LOCALIZATION Right 11/05/2016   Procedure: RIGHT BREAST LUMPECTOMY WITH RADIOACTIVE SEED LOCALIZATION;  Surgeon: Fanny Skates, MD;  Location: [place];  Service: General;  Laterality:  Right;   CARDIAC CATHETERIZATION     CARDIOVERSION N/A 01/31/2017   Procedure: CARDIOVERSION;  Surgeon: Wellington Hampshire, MD;  Location: [facility];  Service: Cardiovascular;  Laterality: N/A;   CARDIOVERSION N/A 03/03/2017   Procedure: CARDIOVERSION;  Surgeon: Wellington Hampshire, MD;  Location: [place] [facility];  Service: Cardiovascular;  Laterality: N/A;   CARDIOVERSION N/A 04/02/2017   Procedure: CARDIOVERSION;  Surgeon: Wellington Hampshire, MD;  Location: [place] [facility];  Service: Cardiovascular;  Laterality: N/A;   CARDIOVERSION N/A 01/16/2018   Procedure: CARDIOVERSION;  Surgeon: Wellington Hampshire, MD;  Location: [facility];  Service: Cardiovascular;  Laterality: N/A;   CATARACT EXTRACTION  05/2010   left eye   CERVICAL DISCECTOMY  1992   Fusion (Dr. Joya Salm)   ESI Bilateral 01/2016   S1 transforaminal ESI x3   ESOPHAGOGASTRODUODENOSCOPY  01/01/2002   with ulcer, bx. neg; + stricture gastric ulcer with hemorrhage   ESOPHAGOGASTRODUODENOSCOPY  04/23/2004   Esophageal stricture -- dilated   INTRAVASCULAR PRESSURE WIRE/FFR STUDY N/A 07/12/2019   Procedure: INTRAVASCULAR PRESSURE WIRE/FFR STUDY;  Surgeon: Wellington Hampshire, MD;  Location: [place] [facility];  Service: Cardiovascular;  Laterality: N/A;   LOWER EXTREMITY ANGIOGRAPHY Left 02/01/2019   LOWER EXTREMITY ANGIOGRAPHY;  Surgeon: Algernon Huxley, MD   LOWER EXTREMITY ANGIOGRAPHY Right 03/25/2019   Procedure: LOWER EXTREMITY ANGIOGRAPHY;  Surgeon: Algernon Huxley, MD;  Location: [place] [facility];  Service: Cardiovascular;  Laterality: Right;   LOWER EXTREMITY ANGIOGRAPHY Right 08/02/2019   Procedure: LOWER EXTREMITY ANGIOGRAPHY;  Surgeon: Algernon Huxley, MD;  Location: [place] [facility];  Service: Cardiovascular;  Laterality: Right;   nuclear stress test  03/2012   no ischemia   PERIPHERAL VASCULAR BALLOON ANGIOPLASTY Left 01/2019   Percutaneous transluminal angioplasty of left anterior and posterior tibial artery with 2.5 mm diameter by 22 cm length angioplasty balloon ([place])   RIGHT/LEFT HEART CATH AND CORONARY ANGIOGRAPHY Bilateral 07/12/2019   Procedure: RIGHT/LEFT HEART CATH AND CORONARY ANGIOGRAPHY;  Surgeon: Wellington Hampshire, MD;  Location: [place] [facility];  Service: Cardiovascular;  Laterality: Bilateral;   SKIN CANCER EXCISION  05/20/2011   BCC from nose   US ECHOCARDIOGRAPHY  03/2012   in flutter, EF 60%, mild-mod aortic, mitral, tricuspid regurg, mildly dilated LA      MEDICATIONS:  Prior to Admission medications   Medication Sig Start Date End Date Taking? Authorizing Provider  acetaminophen (TYLENOL) 500 MG tablet Take 1,000 mg every 8 (eight) hours as needed by mouth for moderate pain.     [provider]  acyclovir (ZOVIRAX) 400 MG tablet Take 400 mg by mouth 2 (two) times daily.    [provider]  Alpha-Lipoic Acid 600 MG TABS Take by mouth.    [provider]  amiodarone (PACERONE) 200 MG tablet TAKE 1/2 TABLET (100MG) BY MOUTH ONCE DAILY 06/28/20   Wellington Hampshire, MD  aspirin EC 81 MG tablet Take 1 tablet (81 mg total) by mouth daily. 02/01/19   Algernon Huxley, MD  atorvastatin (LIPITOR) 20 MG tablet TAKE 1 TABLET BY MOUTH ONCE DAILY 04/27/20   Ria Bush, MD  ciclopirox (LOPROX) 0.77 % cream Apply topically 2 (two) times daily. Apply to entire foot including between toes of both feet 08/09/20   Moye, [place], MD  doxycycline (VIBRA-TABS) 100 MG tablet Take 1 tablet (100 mg total) by mouth 2 (two) times daily. 10/10/20   Venia Carbon, MD  DULoxetine (CYMBALTA) 60 MG capsule TAKE 1  CAPSULE BY MOUTH ONCE DAILY 04/28/20   Ria Bush, MD  ELIQUIS 2.5 MG TABS tablet TAKE 1 TABLET BY MOUTH TWICE DAILY 05/23/20   Wellington [place], MD  gabapentin (NEURONTIN) 400 MG capsule Take 1 capsule (400 mg total) by mouth at bedtime. With second dose during day as needed 08/18/20   Ria Bush, MD  HYDROcodone-acetaminophen (NORCO/VICODIN) 5-325 MG tablet Take 1 tablet by mouth 3 (three) times daily as needed for moderate pain (sedation precautions). Patient taking differently: Take 1 tablet by mouth 3 (three) times daily as needed for moderate pain (sedation precautions). Taking 1/2 tablet. 10/14/20   Ria Bush, MD  lisinopril (ZESTRIL) 10 MG tablet TAKE 1 TABLET BY MOUTH ONCE DAILY 10/14/20   Ria Bush, MD  memantine [facility]) 5 MG tablet TAKE 1 TABLET BY MOUTH TWICE DAILY 04/07/20   Ria Bush, MD   metoprolol tartrate (LOPRESSOR) 25 MG tablet TAKE 1/2 TABLET BY MOUTH TWICE DAILY 01/13/20   Wellington Hampshire, MD  pantoprazole (PROTONIX) 40 MG tablet TAKE 1 TABLET BY MOUTH EVERY DAY 04/27/20   Ria Bush, MD  polyethylene glycol (MIRALAX / GLYCOLAX) 17 g packet Take 17 g by mouth daily as needed for mild constipation. 10/27/19   Nolberto Hanlon, MD  prednisoLONE acetate (PRED FORTE) 1 % ophthalmic suspension Place 1 drop into the left eye daily.     [provider]  rOPINIRole (REQUIP) 1 MG tablet Take 2.5 tablets (2.5 mg total) by mouth at bedtime. 03/30/20   Ria Bush, MD  senna (SENOKOT) 8.6 MG TABS tablet Take 1 tablet by mouth.    [provider]  silver sulfADIAZINE (SILVADENE) 1 % cream Apply 1 application topically daily. 10/10/20   Venia Carbon, MD  tiZANidine (ZANAFLEX) 2 MG tablet Take by mouth daily.    [provider]  traMADol (ULTRAM) 50 MG tablet Take 50 mg by mouth daily. Takes 2 tablets daily 12/04/17   Ria Bush, MD  Vitamin D, Ergocalciferol, (DRISDOL) 1.25 MG (50000 UNIT) CAPS capsule TAKE 1 CAPSULE BY MOUTH ONCE WEEKLY 04/25/20   Tower, Wynelle Fanny, MD     ALLERGIES:  Allergies  Allergen Reactions   Penicillins Rash and Other (See Comments)    Childhood Allergy Has patient had a PCN reaction causing immediate rash, facial/tongue/throat swelling, SOB or lightheadedness with hypotension: Yes Has patient had a PCN reaction causing severe rash involving mucus membranes or skin necrosis: Unknown Has patient had a PCN reaction that required hospitalization: No Has patient had a PCN reaction occurring within the last 10 years: No If all of the above answers are "NO", then may proceed with Cephalosporin use.      SOCIAL HISTORY:  Social History   Socioeconomic History   Marital status: Widowed    Spouse name: Not on file   Number of children: 5   Years of education: Not on file   Highest education level: Not on file   Occupational History   Occupation: Retired - Occupational psychologist: RETIRED  Tobacco Use   Smoking status: Former    Packs/day: 0.25    Years: 1.00    Pack years: 0.25    Types: Cigarettes    Quit date: 04/08/1976    Years since quitting: 44.5   Smokeless tobacco: Never   Tobacco comments:    pt smokes occasionally "socially"  Vaping Use   Vaping Use: Never used  Substance and Sexual Activity   Alcohol use: No    Alcohol/week: 1.0  standard drink    Types: 1 Glasses of wine per week   Drug use: No   Sexual activity: Never  Other Topics Concern   Not on file  Social History Narrative   Lives with grandson who smokes, dog   From [place].  Widowed 01/1999; 1st marriage 25 years / 2nd marriage 13 years.   One son deceased 29-Jan-2023.   Activity: active with dog   Diet: good water, fruits/vegetables daily   Social Determinants of Health   Financial Resource Strain: Not on file  Food Insecurity: Not on file  Transportation Needs: Not on file  Physical Activity: Not on file  Stress: Not on file  Social Connections: Not on file  Intimate Partner Violence: Not on file     FAMILY HISTORY:  Family History  Problem Relation Age of Onset   Emphysema Mother    Heart disease Father        MI   Stroke Father        first at age 42.   Stroke Sister    Diabetes Sister    Hypertension Brother    Heart disease Brother        Heart Valve   Stroke Brother    Diabetes Brother    Cancer Brother        esophageal   Diabetes Brother    Diabetes Brother    Colon cancer Neg Hx       REVIEW OF SYSTEMS:  Review of Systems  Constitutional:  Negative for chills and fever.  HENT:  Negative for congestion and sore throat.   Respiratory:  Negative for cough and shortness of breath.   Cardiovascular:  Negative for chest pain and palpitations.  Gastrointestinal:  Positive for abdominal pain. Negative for constipation, diarrhea, nausea and vomiting.  Genitourinary:  Negative for dysuria and  urgency.  Musculoskeletal:  Positive for falls. Negative for neck pain.  All other systems reviewed and are negative.  VITAL SIGNS:  Temp:  [98.9 F (37.2 C)] 98.9 F (37.2 C) (07/20 0729) Pulse Rate:  [65-89] 65 (07/20 1030) Resp:  [13-17] 13 (07/20 1030) BP: (108-124)/(59-71) 124/64 (07/20 1030) SpO2:  [95 %-98 %] 98 % (07/20 1030)             INTAKE/OUTPUT:  No intake/output data recorded.  PHYSICAL EXAM:  Physical Exam Vitals and nursing note reviewed. Exam conducted with a chaperone present.  Constitutional:      Comments: Patient resting in bed, NAD, daughter at bedside and on phone  HENT:     Head: Normocephalic and atraumatic.  Eyes:     General: No scleral icterus.    Conjunctiva/sclera: Conjunctivae normal.  Cardiovascular:     Rate and Rhythm: Normal rate.     Pulses: Normal pulses.     Heart sounds: No murmur heard. Pulmonary:     Effort: Pulmonary effort is normal. No respiratory distress.     Breath sounds: Normal breath sounds.  Abdominal:     General: A surgical scar is present. There is no distension.     Palpations: Abdomen is soft.     Tenderness: There is no abdominal tenderness. There is no guarding or rebound. Negative signs include Murphy's sign.     Comments: Abdomen is soft, non-tender, non-distended, no rebound/guarding, negative Murphy's Sign   Musculoskeletal:     Right lower leg: No edema.     Left lower leg: No edema.  Skin:    General: Skin is warm and dry.  Comments: Chronic venous stasis changes to the bilateral feet and lower extremity, stage 1 wounds in various places, history of chronic osteomyelitis  Neurological:     General: No focal deficit present.     Mental Status: She is oriented to person, place, and time.  Psychiatric:        Mood and Affect: Mood normal.        Behavior: Behavior normal.     Labs:  CBC Latest Ref Rng & Units 10/25/2020 10/11/2020 06/15/2020  WBC 4.0 - 10.5 K/uL 15.2(H) 9.5 6.5  Hemoglobin 12.0 -  15.0 g/dL 11.9(L) 12.0 11.1(L)  Hematocrit 36.0 - 46.0 % 35.3(L) 35.4(L) 34.9(L)  Platelets 150 - 400 K/uL 258 242.0 308.0   CMP Latest Ref Rng & Units 10/25/2020 10/11/2020 07/12/2020  Glucose 70 - 99 mg/dL 166(H) 92 85  BUN 8 - 23 mg/dL '19 19 19  ' Creatinine 0.44 - 1.00 mg/dL 0.82 0.78 0.98  Sodium 135 - 145 mmol/L 137 145 140  Potassium 3.5 - 5.1 mmol/L 3.3(L) 4.7 4.4  Chloride 98 - 111 mmol/L 101 107 105  CO2 22 - 32 mmol/L '23 30 28  ' Calcium 8.9 - 10.3 mg/dL 9.2 9.4 8.9  Total Protein 6.5 - 8.1 g/dL 6.0(L) 6.2 -  Total Bilirubin 0.3 - 1.2 mg/dL 2.4(H) 0.4 -  Alkaline Phos 38 - 126 U/L 327(H) 51 -  AST 15 - 41 U/L 609(H) 13 -  ALT 0 - 44 U/L 505(H) 14 -     Imaging studies:   RUQ [place] (10/25/2020) personally reviewed with large gallstone and sludge, no pericholecystic changes, no CBD dilation, and radiologist report reviewed:  IMPRESSION: Prominent gallstone and sludge ball within the gallbladder noted. Gallbladder wall thickness normal. No biliary distention.   Assessment/Plan: (ICD-10's: K72.20) 87 y.o. female with what sounds like unwitnessed mechanical fall from bed this morning incidentally found to have large gallstone and elevated LFT/Hyperbilirubinemia without evidence of cholecystitis, complicated by pertinent comorbidities including advanced age, debilitation, and numerous comorbidities.   - I had a long discussion with the patient and her daughter's (one at bedside, one via telephone) regarding her presentation and incidental findings listed above. I have a very low clinical suspicion for cholecystitis at this time given her lack of pain on examination and lack of gallbladder wall changes/fluid on [place]. Question if her leukocytosis is more related to her chronic osteomyelitis. She does have transaminitis and hyperbilirubinemia which is new compared to labs on 07/06; however, I question if this is more attributable to extrinsic pressure from the large gallstone (ex: Mirizzi  Syndrome) rather than a biliary obstruction such as choledocholithiasis.  After long discussion with family members regarding options, we agreed to trend her labs and observe. If her LFTs/Hyperbilirubinemia are persistent or worse tomorrow, we can consider MRCP. Again, right now my suspicion for cholecystitis is very low and she does not warrant any emergent interventions. Patient and her family members are understanding and agreeable with this plan.    - Okay with CLD; ADAT   - Monitor abdominal examination; on-going bowel function - Pain control prn; antiemetics prn - Morning; CBC, Hepatic Panel - No surgical intervention warranted; we will follow - Further management per primary service; we will follow    All of the above findings and recommendations were discussed with the patient and her daughters (in person, & via telephone), and all of their questions were answered to their expressed satisfaction.  Thank you for the opportunity to participate in this patient's care.   --  Edison Simon, PA-C [facility] 10/25/2020, 11:42 AM [phone] M-F: 7am - 4pm

## 2020-10-25 NOTE — Consult Note (Signed)
Pharmacy Antibiotic Note  [name] is a 87 y.o. female with medical history including CAD, abdominal aortic atherosclerosis, GERD, HTN, Afib, PAD, right foot OM, recent COVID-19 infection admitted on 10/25/2020 with  weakness . Labs notable for new transaminitis. Abdominal ultrasound showed prominent gallstone and sludging. General surgery consulted, low suspicion for cholecystitis at this time. Pharmacy has been consulted for Zosyn dosing for possible IAI.  PCN allergy noted (low severity, rash). RN discussed allergy with patient. Remote history of rash with PCN antibiotic in ~1970. Patient denies experiencing throat swelling / shortness of breath. Patient is willing to proceed with Zosyn at this time.   Plan:  Zosysn 3.375 g IV q8h (4-hr infusion)   Temp (24hrs), Avg:98.9 F (37.2 C), Min:98.9 F (37.2 C), Max:98.9 F (37.2 C)  Recent Labs  Lab 10/25/20 0720 10/25/20 0740 10/25/20 1128  WBC 15.2*  --   --   CREATININE 0.82  --   --   LATICACIDVEN  --  3.4* 2.6*    CrCl cannot be calculated (Unknown ideal weight.).    Allergies  Allergen Reactions   Penicillins Rash and Other (See Comments)    Childhood Allergy Has patient had a PCN reaction causing immediate rash, facial/tongue/throat swelling, SOB or lightheadedness with hypotension: Yes Has patient had a PCN reaction causing severe rash involving mucus membranes or skin necrosis: Unknown Has patient had a PCN reaction that required hospitalization: No Has patient had a PCN reaction occurring within the last 10 years: No If all of the above answers are "NO", then may proceed with Cephalosporin use.     Antimicrobials this admission: Vancomycin 7/20 x 1 Cefepime 7/20 x 1 Zosyn 7/20 >>   Dose adjustments this admission: N/A  Microbiology results: 7/20 BCx: pending  Thank you for allowing pharmacy to be a part of this patient's care.  Benita Gutter 10/25/2020 1:51 PM

## 2020-10-25 NOTE — Progress Notes (Addendum)
Wound care nurse was consulted for pt wound on right hip, R knee, and right and left foot. Will continue to monitor.  Update 2210 :Lab called for a critical lactic acid of . MD R. Tamala Julian made aware but no new order was place and states to continue monitor. Will continue to monitor.  Update 2333: MD Mith added magnesium sulfate IVPB 2 g 50 ml once.  Update 2340: Pt was admitted with no signs of distress. Pt alert x 4. VSS. Pt was educated about safety and ascom within pt reach. Will continue to monitor.  Update 0539: Pt BP 172/87 HR 74. No PRN order on MAR. MD Tamala Julian made aware. Will continue to monitor.  Update 0547: MD Tamala Julian ordered Hydralazine 10 mg IV every 4 hours PRN for Systolic BP >500 or Diastolic XF>818.

## 2020-10-26 ENCOUNTER — Inpatient Hospital Stay: Payer: Medicare Other

## 2020-10-26 DIAGNOSIS — R7401 Elevation of levels of liver transaminase levels: Secondary | ICD-10-CM

## 2020-10-26 DIAGNOSIS — I2510 Atherosclerotic heart disease of native coronary artery without angina pectoris: Secondary | ICD-10-CM

## 2020-10-26 DIAGNOSIS — I10 Essential (primary) hypertension: Secondary | ICD-10-CM

## 2020-10-26 DIAGNOSIS — R296 Repeated falls: Secondary | ICD-10-CM

## 2020-10-26 DIAGNOSIS — I739 Peripheral vascular disease, unspecified: Secondary | ICD-10-CM

## 2020-10-26 DIAGNOSIS — I5032 Chronic diastolic (congestive) heart failure: Secondary | ICD-10-CM

## 2020-10-26 DIAGNOSIS — E872 Acidosis: Secondary | ICD-10-CM

## 2020-10-26 DIAGNOSIS — N1831 Chronic kidney disease, stage 3a: Secondary | ICD-10-CM | POA: Diagnosis not present

## 2020-10-26 DIAGNOSIS — R531 Weakness: Secondary | ICD-10-CM | POA: Diagnosis not present

## 2020-10-26 LAB — CBC
HCT: 34.5 % — ABNORMAL LOW (ref 36.0–46.0)
Hemoglobin: 11.4 g/dL — ABNORMAL LOW (ref 12.0–15.0)
MCH: 32.9 pg (ref 26.0–34.0)
MCHC: 33 g/dL (ref 30.0–36.0)
MCV: 99.4 fL (ref 80.0–100.0)
Platelets: 248 10*3/uL (ref 150–400)
RBC: 3.47 MIL/uL — ABNORMAL LOW (ref 3.87–5.11)
RDW: 14.4 % (ref 11.5–15.5)
WBC: 14.4 10*3/uL — ABNORMAL HIGH (ref 4.0–10.5)
nRBC: 0 % (ref 0.0–0.2)

## 2020-10-26 LAB — COMPREHENSIVE METABOLIC PANEL
ALT: 445 U/L — ABNORMAL HIGH (ref 0–44)
AST: 322 U/L — ABNORMAL HIGH (ref 15–41)
Albumin: 2.9 g/dL — ABNORMAL LOW (ref 3.5–5.0)
Alkaline Phosphatase: 285 U/L — ABNORMAL HIGH (ref 38–126)
Anion gap: 8 (ref 5–15)
BUN: 13 mg/dL (ref 8–23)
CO2: 27 mmol/L (ref 22–32)
Calcium: 8.8 mg/dL — ABNORMAL LOW (ref 8.9–10.3)
Chloride: 107 mmol/L (ref 98–111)
Creatinine, Ser: 0.72 mg/dL (ref 0.44–1.00)
GFR, Estimated: >60 mL/min (ref >60)
Glucose, Bld: 125 mg/dL — ABNORMAL HIGH (ref 70–99)
Potassium: 4 mmol/L (ref 3.5–5.1)
Sodium: 142 mmol/L (ref 135–145)
Total Bilirubin: 4 mg/dL — ABNORMAL HIGH (ref 0.3–1.2)
Total Protein: 5.7 g/dL — ABNORMAL LOW (ref 6.5–8.1)

## 2020-10-26 LAB — MRSA NEXT GEN BY PCR, NASAL: MRSA by PCR Next Gen: NOT DETECTED

## 2020-10-26 LAB — LACTIC ACID, PLASMA: Lactic Acid, Venous: 2 mmol/L (ref 0.5–1.9)

## 2020-10-26 MED ORDER — GADOBUTROL 1 MMOL/ML IV SOLN
5.0000 mL | Freq: Once | INTRAVENOUS | Status: AC | PRN
  Administered 2020-10-26: 13:00:00 5 mL via INTRAVENOUS

## 2020-10-26 MED ORDER — HEPARIN SODIUM (PORCINE) 5000 UNIT/ML IJ SOLN
5000.0000 [IU] | Freq: Three times a day (TID) | INTRAMUSCULAR | Status: DC
  Administered 2020-10-27 (×2): 5000 [IU] via SUBCUTANEOUS
  Filled 2020-10-26 (×2): qty 1

## 2020-10-26 MED ORDER — ALPRAZOLAM 0.25 MG PO TABS
0.2500 mg | ORAL_TABLET | Freq: Once | ORAL | Status: AC | PRN
  Administered 2020-10-26: 23:00:00 0.25 mg via ORAL
  Filled 2020-10-26: qty 1

## 2020-10-26 MED ORDER — LISINOPRIL 10 MG PO TABS
10.0000 mg | ORAL_TABLET | Freq: Every day | ORAL | Status: DC
  Administered 2020-10-26 – 2020-10-27 (×2): 10 mg via ORAL
  Filled 2020-10-26 (×2): qty 1

## 2020-10-26 MED ORDER — METOPROLOL TARTRATE 25 MG PO TABS
12.5000 mg | ORAL_TABLET | Freq: Two times a day (BID) | ORAL | Status: DC
  Administered 2020-10-26 – 2020-10-27 (×3): 12.5 mg via ORAL
  Filled 2020-10-26 (×3): qty 1

## 2020-10-26 MED ORDER — MORPHINE SULFATE (PF) 2 MG/ML IV SOLN
2.0000 mg | INTRAVENOUS | Status: DC | PRN
  Administered 2020-10-26: 2 mg via INTRAVENOUS
  Filled 2020-10-26: qty 1

## 2020-10-26 MED ORDER — HYDRALAZINE HCL 20 MG/ML IJ SOLN
10.0000 mg | INTRAMUSCULAR | Status: DC | PRN
  Administered 2020-10-26: 10 mg via INTRAVENOUS
  Filled 2020-10-26: qty 1

## 2020-10-26 NOTE — Progress Notes (Signed)
PROGRESS NOTE  [name]  VXB:[id number] DOB: March 08, 1930 DOA: 10/25/2020 PCP: Ria Bush, MD   Brief Narrative: [name] is an 87 y.o. female with a history of CAD, HTN, chronic AFib, PVD, right 5th toe/metatarsal osteoarthritis on doxycycline, covid-19 diagnosed 10/02/2020 treated with paxlovid, and cholelithiasis reported to the ED from home 7/20 after 2 falls and generalized weakness. Work up revealed elevated LFTs (AST 609, ALT 505, TBili 2.4), lacitc acid elevated to 3.4, leukocytosis to 15.2k. Right foot XR confirmed erosive bony changes consistent with osteomyelitis of the 5th digit. RUQ U/S demonstrated gallstone of 1.2cm, and sludge ball of 3.2cm without wall thickening or pericholecystic fluid. Blood cultures were drawn, patient placed on zosyn with surgery consulted. Blood cultures grew E. coli for which ceftriaxone is continued. Bilirubin increased, so MRCP was performed revealing no cholecystitis.   Assessment & Plan: Principal Problem:   Generalized weakness Active Problems:   Essential hypertension   PAD (peripheral artery disease) (HCC)   CAD (coronary artery disease), native coronary artery   CKD (chronic kidney disease) stage 3, GFR 30-59 ml/min (HCC)   Chronic diastolic CHF (congestive heart failure) (HCC)   Osteomyelitis (HCC)   Frequent falls   Transaminitis   Lactic acidosis  E. coli bacteremia: Suspect GI source more than osteomyelitis at this time. No pyuria or bacteriuria or symptoms of UTI.  - Continue ceftriaxone 2g IV q24h pending susceptibility testing.  - Will d/w ID  Elevated LFTs: AST/ALT improved, bilirubin actually rising.  - Check MRCP, appreciate general surgery evaluation.  - Addendum: MRCP revealed no cholecystitis but did suggest CBD stone with proximal dilatation consistent with hyperbilirubinemia. Will consult GI and make NPO p MN for possible ERCP 7/22.  Osteomyelitis of right fifth metatarsal head, proximal 5th phalanx:  -  Continue doxycycline.  Generalized weakness, falls: Possibly related to blood stream infection.  - PT, OT - Fall precautions  History of covid-19 infection: +10/02/2020, treated with paxlovid.  - No further isolation or management required.   Chronic atrial fibrillation:  - Continue amiodarone (this is typically a contraindication to paxlovid Tx), metoprolol. - Continue eliquis  Chronic HFpEF, HTN:  - Restart home lisinopril and metoprolol with elevated BP  PVD:  - Continue ASA. Holding statin with LFT derangements. Confirms statin (as well as eliquis) was held during paxlovid  treatment.   Vomiting:  - Continue PPI  Hypokalemia: Improved. - Supplement, monitor.   Lactic acidosis: Likely related to bacteremia. Improving.  DVT prophylaxis: Eliquis Code Status: DNR Family Communication: At bedside Disposition Plan:  Status is: Inpatient  Remains inpatient appropriate because:Ongoing diagnostic testing needed not appropriate for outpatient work up and Inpatient level of care appropriate due to severity of illness  Dispo: The patient is from: Home              Anticipated d/c is to: Home              Patient currently is not medically stable to d/c.   Difficult to place patient No  Consultants:  General surgery  Procedures:  MRCP  Antimicrobials: Zosyn > ceftriaxone Doxycycline   Subjective: Pt is HOH, denies abd pain. No N/V since arriving here. Denies fever or chills. No dysuria or current diarrhea.   Objective: Vitals:   10/26/20 0500 10/26/20 0832 10/26/20 1153 10/26/20 1522  BP: (!) 172/87 (!) 173/85 (!) 145/88 (!) 161/82  Pulse:  73 78 81  Resp:  17 16 15   Temp:  98.7 F (37.1 C)  98.3 F (36.8 C) 97.9 F (36.6 C)  TempSrc:  Oral Oral Oral  SpO2:  98% 93% 95%  Weight:        Intake/Output Summary (Last 24 hours) at 10/26/2020 1756 Last data filed at 10/26/2020 1534 Gross per 24 hour  Intake 2184.98 ml  Output 550 ml  Net 1634.98 ml   Filed  Weights   10/25/20 1708  Weight: 59 kg    Gen: Elderly female in no distress Pulm: Non-labored breathing. Clear to auscultation bilaterally.  CV: Regular rate and rhythm. No murmur, rub, or gallop. No JVD, no pitting pedal edema. GI: Abdomen soft, not significantly tender but a technically positive Murphy sign on exam. Non-distended, with normoactive bowel sounds. No organomegaly or masses felt. Ext: Warm, no deformities Skin:Bilateral feet with lateral swelling, right more tender with closed wound overlying 5th MT head without significant spreading erythema. No induration or fluctuance.  Neuro: Alert and oriented. No focal neurological deficits. Psych: Judgement and insight appear normal. Mood & affect appropriate.   Data Reviewed: I have personally reviewed following labs and imaging studies  CBC: Recent Labs  Lab 10/25/20 0720 10/26/20 0038  WBC 15.2* 14.4*  NEUTROABS 14.1*  --   HGB 11.9* 11.4*  HCT 35.3* 34.5*  MCV 100.3* 99.4  PLT 258 846   Basic Metabolic Panel: Recent Labs  Lab 10/25/20 0720 10/25/20 0740 10/26/20 0038  NA 137  --  142  K 3.3*  --  4.0  CL 101  --  107  CO2 23  --  27  GLUCOSE 166*  --  125*  BUN 19  --  13  CREATININE 0.82  --  0.72  CALCIUM 9.2  --  8.8*  MG  --  1.6*  --    GFR: Estimated Creatinine Clearance: 39.9 mL/min (by C-G formula based on SCr of 0.72 mg/dL). Liver Function Tests: Recent Labs  Lab 10/25/20 0720 10/26/20 0038  AST 609* 322*  ALT 505* 445*  ALKPHOS 327* 285*  BILITOT 2.4* 4.0*  PROT 6.0* 5.7*  ALBUMIN 3.0* 2.9*   Recent Labs  Lab 10/25/20 0906  LIPASE 25   No results for input(s): AMMONIA in the last 168 hours. Coagulation Profile: No results for input(s): INR, PROTIME in the last 168 hours. Cardiac Enzymes: Recent Labs  Lab 10/25/20 1554  CKTOTAL 74   BNP (last 3 results) No results for input(s): PROBNP in the last 8760 hours. HbA1C: No results for input(s): HGBA1C in the last 72  hours. CBG: No results for input(s): GLUCAP in the last 168 hours. Lipid Profile: No results for input(s): CHOL, HDL, LDLCALC, TRIG, CHOLHDL, LDLDIRECT in the last 72 hours. Thyroid Function Tests: No results for input(s): TSH, T4TOTAL, FREET4, T3FREE, THYROIDAB in the last 72 hours. Anemia Panel: No results for input(s): VITAMINB12, FOLATE, FERRITIN, TIBC, IRON, RETICCTPCT in the last 72 hours. Urine analysis:    Component Value Date/Time   COLORURINE AMBER (A) 10/25/2020 1000   APPEARANCEUR CLEAR (A) 10/25/2020 1000   LABSPEC 1.028 10/25/2020 1000   PHURINE 6.0 10/25/2020 1000   GLUCOSEU NEGATIVE 10/25/2020 1000   HGBUR NEGATIVE 10/25/2020 1000   BILIRUBINUR MODERATE (A) 10/25/2020 1000   BILIRUBINUR negative 05/05/2019 1716   KETONESUR NEGATIVE 10/25/2020 1000   PROTEINUR 30 (A) 10/25/2020 1000   UROBILINOGEN 0.2 05/05/2019 1716   NITRITE NEGATIVE 10/25/2020 1000   LEUKOCYTESUR NEGATIVE 10/25/2020 1000   Recent Results (from the past 240 hour(s))  Blood culture (routine x 2)  Status: None (Preliminary result)   Collection Time: 10/25/20  8:40 AM   Specimen: BLOOD  Result Value Ref Range Status   Specimen Description   Final    BLOOD RIGHT FA Performed at [facility], [address] [place], [place] [address]    Special Requests   Final    BOTTLES DRAWN AEROBIC AND ANAEROBIC Blood Culture results may not be optimal due to an excessive volume of blood received in culture bottles Performed at [facility], [address] [place], [place] [address]    Culture  Setup Time   Final    Organism ID to follow [place] CRITICAL RESULT CALLED TO, READ BACK BY AND VERIFIED WITH: SAMANTHA [email or web address]  10/25/20 RH    Culture GRAM NEGATIVE RODS  Final   Report Status PENDING  Incomplete  Blood Culture ID Panel (Reflexed)     Status: Abnormal   Collection Time: 10/25/20  8:40 AM  Result Value Ref Range Status   Enterococcus  faecalis NOT DETECTED NOT DETECTED Final   Enterococcus Faecium NOT DETECTED NOT DETECTED Final   Listeria monocytogenes NOT DETECTED NOT DETECTED Final   Staphylococcus species NOT DETECTED NOT DETECTED Final   Staphylococcus aureus (BCID) NOT DETECTED NOT DETECTED Final   Staphylococcus epidermidis NOT DETECTED NOT DETECTED Final   Staphylococcus lugdunensis NOT DETECTED NOT DETECTED Final   Streptococcus species NOT DETECTED NOT DETECTED Final   Streptococcus agalactiae NOT DETECTED NOT DETECTED Final   Streptococcus pneumoniae NOT DETECTED NOT DETECTED Final   Streptococcus pyogenes NOT DETECTED NOT DETECTED Final   A.calcoaceticus-baumannii NOT DETECTED NOT DETECTED Final   Bacteroides fragilis NOT DETECTED NOT DETECTED Final   Enterobacterales DETECTED (A) NOT DETECTED Final    Comment: Enterobacterales represent a large order of gram negative bacteria, not a single organism. CRITICAL RESULT CALLED TO, READ BACK BY AND VERIFIED WITH: SAMANTHA RAUER@[email or web address]  10/25/20 RH    Enterobacter cloacae complex NOT DETECTED NOT DETECTED Final   Escherichia coli DETECTED (A) NOT DETECTED Final    Comment: CRITICAL RESULT CALLED TO, READ BACK BY AND VERIFIED WITH: SAMANTHA RAUER@[email or web address]  10/25/20 RH    Klebsiella aerogenes NOT DETECTED NOT DETECTED Final   Klebsiella oxytoca NOT DETECTED NOT DETECTED Final   Klebsiella pneumoniae NOT DETECTED NOT DETECTED Final   Proteus species NOT DETECTED NOT DETECTED Final   Salmonella species NOT DETECTED NOT DETECTED Final   Serratia marcescens NOT DETECTED NOT DETECTED Final   Haemophilus influenzae NOT DETECTED NOT DETECTED Final   Neisseria meningitidis NOT DETECTED NOT DETECTED Final   Pseudomonas aeruginosa NOT DETECTED NOT DETECTED Final   Stenotrophomonas maltophilia NOT DETECTED NOT DETECTED Final   Candida albicans NOT DETECTED NOT DETECTED Final   Candida auris NOT DETECTED NOT DETECTED Final   Candida glabrata NOT DETECTED NOT DETECTED Final    Candida krusei NOT DETECTED NOT DETECTED Final   Candida parapsilosis NOT DETECTED NOT DETECTED Final   Candida tropicalis NOT DETECTED NOT DETECTED Final   Cryptococcus neoformans/gattii NOT DETECTED NOT DETECTED Final   CTX-M ESBL NOT DETECTED NOT DETECTED Final   Carbapenem resistance IMP NOT DETECTED NOT DETECTED Final   Carbapenem resistance KPC NOT DETECTED NOT DETECTED Final   Carbapenem resistance NDM NOT DETECTED NOT DETECTED Final   Carbapenem resist OXA 48 LIKE NOT DETECTED NOT DETECTED Final   Carbapenem resistance VIM NOT DETECTED NOT DETECTED Final    Comment: Performed at [facility], [address], [place], [place] [address]  Resp Panel by RT-PCR (Flu A&B, Covid) Nasopharyngeal Swab     Status: Abnormal   Collection Time: 10/25/20  8:50 AM   Specimen: Nasopharyngeal Swab; Nasopharyngeal(NP) swabs in vial transport medium  Result Value Ref Range Status   SARS Coronavirus 2 by RT PCR POSITIVE (A) NEGATIVE Final    Comment: RESULT CALLED TO, READ BACK BY AND VERIFIED WITH: Rosemary Holms, RN 1215 10/25/20 GM (NOTE) SARS-CoV-2 target nucleic acids are DETECTED.  The SARS-CoV-2 RNA is generally detectable in upper respiratory specimens during the acute phase of infection. Positive results are indicative of the presence of the identified virus, but do not rule out bacterial infection or co-infection with other pathogens not detected by the test. Clinical correlation with patient history and other diagnostic information is necessary to determine patient infection status. The expected result is Negative.  Fact Sheet for Patients: EntrepreneurPulse.com.au  Fact Sheet for Healthcare Providers: IncredibleEmployment.be  This test is not yet approved or cleared by the [place] FDA and  has been authorized for detection and/or diagnosis of SARS-CoV-2 by FDA under an Emergency Use Authorization (EUA).  This EUA will remain in  effect (meaning this test can be [place] ed) for the duration of  the COVID-19 declaration under Section 564(b)(1) of the Act, 21 U.S.C. section 360bbb-3(b)(1), unless the authorization is terminated or revoked sooner.     Influenza A by PCR NEGATIVE NEGATIVE Final   Influenza B by PCR NEGATIVE NEGATIVE Final    Comment: (NOTE) The Xpert Xpress SARS-CoV-2/FLU/RSV plus assay is intended as an aid in the diagnosis of influenza from Nasopharyngeal swab specimens and should not be used as a sole basis for treatment. Nasal washings and aspirates are unacceptable for Xpert Xpress SARS-CoV-2/FLU/RSV testing.  Fact Sheet for Patients: EntrepreneurPulse.com.au  Fact Sheet for Healthcare Providers: IncredibleEmployment.be  This test is not yet approved or cleared by the [place] FDA and has been authorized for detection and/or diagnosis of SARS-CoV-2 by FDA under an Emergency Use Authorization (EUA). This EUA will remain in effect (meaning this test can be used) for the duration of the COVID-19 declaration under Section 564(b)(1) of the Act, 21 U.S.C. section 360bbb-3(b)(1), unless the authorization is terminated or revoked.  Performed at [facility], [address] [place], [place] [address]   Blood culture (routine x 2)     Status: None (Preliminary result)   Collection Time: 10/25/20  8:52 AM   Specimen: BLOOD  Result Value Ref Range Status   Specimen Description   Final    BLOOD RIGHT [facility] Performed at [facility], [address], [place], [place] [address]    Special Requests   Final    BOTTLES DRAWN AEROBIC AND ANAEROBIC Blood Culture adequate volume Performed at [facility], [address] [place], [place] [address]    Culture  Setup Time   Final    GRAM NEGATIVE RODS AEROBIC BOTTLE ONLY CRITICAL VALUE NOTED.  VALUE IS CONSISTENT WITH PREVIOUSLY REPORTED AND CALLED VALUE.    Culture   Final    NO GROWTH  < 24 HOURS Performed at [facility], [address] [place], [place] [address]    Report Status PENDING  Incomplete  MRSA Next Gen by PCR, Nasal     Status: None   Collection Time: 10/26/20 12:36 AM   Specimen: Nasal Mucosa; Nasal Swab  Result Value Ref Range Status   MRSA by PCR Next Gen NOT DETECTED NOT DETECTED Final    Comment: (NOTE)  The GeneXpert MRSA Assay (FDA approved for NASAL specimens only), is one component of a comprehensive MRSA colonization surveillance program. It is not intended to diagnose MRSA infection nor to guide or monitor treatment for MRSA infections. Test performance is not FDA approved in patients less than 55 years old. Performed at [facility], [address], [place], [place] [address]       Radiology Studies: DG Chest 1 View  Result Date: 10/25/2020 CLINICAL DATA:  Weakness. EXAM: CHEST  1 VIEW COMPARISON:  01/19/2020.  CT 07/06/2019. FINDINGS: Cardiomegaly. No pulmonary venous congestion. Low lung volumes. Mild left base atelectasis/scarring. No focal infiltrate. No pleural effusion or pneumothorax. Degenerative change and scoliosis thoracic spine. Reference is made to prior chest CT report for discussion of thoracic spine compression fractures. Surgical clips right chest IMPRESSION: 1.  Cardiomegaly.  No pulmonary venous congestion. 2.  No acute pulmonary disease. Electronically Signed   By: Marcello Moores  Register   On: 10/25/2020 08:18   CT Head Wo Contrast  Result Date: 10/25/2020 CLINICAL DATA:  Head trauma, neck trauma EXAM: CT HEAD WITHOUT CONTRAST CT CERVICAL SPINE WITHOUT CONTRAST TECHNIQUE: Multidetector CT imaging of the head and cervical spine was performed following the standard protocol without intravenous contrast. Multiplanar CT image reconstructions of the cervical spine were also generated. COMPARISON:  07/03/2018 FINDINGS: CT HEAD FINDINGS Brain: No evidence of acute infarction, hemorrhage, hydrocephalus, extra-axial  collection or mass lesion/mass effect. Moderate low-density changes within the periventricular and subcortical white matter compatible with chronic microvascular ischemic change. Mild diffuse cerebral volume loss. Vascular: Atherosclerotic calcifications involving the large vessels of the skull base. No unexpected hyperdense vessel. Skull: Normal. Negative for fracture or focal lesion. Sinuses/Orbits: No acute finding. Other: Negative for scalp hematoma. CT CERVICAL SPINE FINDINGS Alignment: Facet joints are aligned without dislocation or traumatic listhesis. Dens and lateral masses are aligned. Straightening of the cervical lordosis. Similar degree of grade 1 anterolisthesis of C3 on C4. Slight retrolisthesis of C5 on C6. Skull base and vertebrae: No acute fracture. No primary bone lesion or focal pathologic process. Soft tissues and spinal canal: No prevertebral fluid or swelling. No visible canal hematoma. Disc levels: Multilevel degenerative disc disease, most pronounced at C6-7. Multilevel bilateral facet joint arthropathy, slightly more pronounced on the right. No significant interval progression from prior. Upper chest: Included lung apices demonstrate biapical pleuroparenchymal scarring, similar to prior. Other: Bilateral carotid atherosclerosis. IMPRESSION: 1. No acute intracranial findings. 2. No evidence of acute traumatic injury to the cervical spine. 3. Moderate chronic microvascular ischemic changes and cerebral volume loss. 4. Multilevel cervical spondylosis, similar to prior. Electronically Signed   By: Davina Poke D.O.   On: 10/25/2020 08:21   CT Cervical Spine Wo Contrast  Result Date: 10/25/2020 CLINICAL DATA:  Head trauma, neck trauma EXAM: CT HEAD WITHOUT CONTRAST CT CERVICAL SPINE WITHOUT CONTRAST TECHNIQUE: Multidetector CT imaging of the head and cervical spine was performed following the standard protocol without intravenous contrast. Multiplanar CT image reconstructions of the  cervical spine were also generated. COMPARISON:  07/03/2018 FINDINGS: CT HEAD FINDINGS Brain: No evidence of acute infarction, hemorrhage, hydrocephalus, extra-axial collection or mass lesion/mass effect. Moderate low-density changes within the periventricular and subcortical white matter compatible with chronic microvascular ischemic change. Mild diffuse cerebral volume loss. Vascular: Atherosclerotic calcifications involving the large vessels of the skull base. No unexpected hyperdense vessel. Skull: Normal. Negative for fracture or focal lesion. Sinuses/Orbits: No acute finding. Other: Negative for scalp hematoma. CT CERVICAL SPINE FINDINGS Alignment: Facet joints  are aligned without dislocation or traumatic listhesis. Dens and lateral masses are aligned. Straightening of the cervical lordosis. Similar degree of grade 1 anterolisthesis of C3 on C4. Slight retrolisthesis of C5 on C6. Skull base and vertebrae: No acute fracture. No primary bone lesion or focal pathologic process. Soft tissues and spinal canal: No prevertebral fluid or swelling. No visible canal hematoma. Disc levels: Multilevel degenerative disc disease, most pronounced at C6-7. Multilevel bilateral facet joint arthropathy, slightly more pronounced on the right. No significant interval progression from prior. Upper chest: Included lung apices demonstrate biapical pleuroparenchymal scarring, similar to prior. Other: Bilateral carotid atherosclerosis. IMPRESSION: 1. No acute intracranial findings. 2. No evidence of acute traumatic injury to the cervical spine. 3. Moderate chronic microvascular ischemic changes and cerebral volume loss. 4. Multilevel cervical spondylosis, similar to prior. Electronically Signed   By: Davina Poke D.O.   On: 10/25/2020 08:21   MR 3D Recon At Scanner  Result Date: 10/26/2020 CLINICAL DATA:  Jaundice, cholelithiasis EXAM: MRI ABDOMEN WITHOUT AND WITH CONTRAST (INCLUDING MRCP) TECHNIQUE: Multiplanar multisequence  MR imaging of the abdomen was performed both before and after the administration of intravenous contrast. Heavily T2-weighted images of the biliary and pancreatic ducts were obtained, and three-dimensional MRCP images were rendered by post processing. CONTRAST:  78mL GADAVIST GADOBUTROL 1 MMOL/ML IV SOLN COMPARISON:  Right upper quadrant ultrasound dated 10/25/2020 FINDINGS: Motion degraded images. Lower chest: Lung bases are clear. Hepatobiliary: Signal loss in the liver on inphase imaging, suggesting iron deposition. No suspicious/enhancing hepatic lesions. Distended gallbladder with a 13 mm gallstone and gallbladder sludge (series 17/image 20). No associated inflammatory changes. No intrahepatic or extrahepatic ductal dilatation. Common duct measures 7 mm. Associated 5-6 mm mid CBD stone (series 3/image 18). Pancreas:  Within normal limits. Spleen: Signal loss in the spleen on inphase imaging, suggesting iron deposition. Adrenals/Urinary Tract:  Adrenal glands are within normal limits. Kidneys are within normal limits.  No hydronephrosis. Stomach/Bowel: Stomach is notable for a large hiatal hernia/inverted intrathoracic stomach. Visualized bowel is grossly unremarkable. Vascular/Lymphatic:  No evidence of abdominal aortic aneurysm. No suspicious abdominal lymphadenopathy. Other:  No abdominal ascites. Musculoskeletal: Degenerative changes of the lumbar spine. IMPRESSION: Motion degraded images. Cholelithiasis with a suspected 5-6 mm mid CBD stone. No intrahepatic or extrahepatic ductal dilatation. Common duct measures 7 mm. Consider ERCP as clinically warranted. Cholelithiasis, without evidence of acute cholecystitis. Suspected iron deposition in the liver and spleen. Electronically Signed   By: Julian Hy M.D.   On: 10/26/2020 15:59   DG Foot Complete Left  Result Date: 10/25/2020 CLINICAL DATA:  Soft tissue wound, possible osteomyelitis EXAM: LEFT FOOT - COMPLETE 3+ VIEW COMPARISON:  None. FINDINGS:  Small soft tissue defect is noted laterally adjacent to the head of the fifth metatarsal. No underlying bony abnormality is seen. No acute fracture or dislocation is noted. IMPRESSION: Soft tissue changes without acute erosive change to suggest osteomyelitis. Electronically Signed   By: Inez Catalina M.D.   On: 10/25/2020 09:16   DG Foot Complete Right  Result Date: 10/25/2020 CLINICAL DATA:  Evaluation for osteomyelitis. EXAM: RIGHT FOOT COMPLETE - 3+ VIEW COMPARISON:  MRI 11/01/2019.  Right foot series 10/23/2019. FINDINGS: Erosive changes noted the distal aspect of the right fifth metatarsal and at the base of the proximal phalanx of the right fifth digit. Findings consistent with septic arthritis/osteomyelitis. No fracture or dislocation. No radiopaque foreign body. IMPRESSION: Erosive changes noted about the distal aspect of the right fifth metatarsal and at the base  of the proximal phalanx of the right fifth digit. Findings consistent with septic arthritis/osteomyelitis. Electronically Signed   By: Marcello Moores  Register   On: 10/25/2020 09:17   MR ABDOMEN MRCP W WO CONTAST  Result Date: 10/26/2020 CLINICAL DATA:  Jaundice, cholelithiasis EXAM: MRI ABDOMEN WITHOUT AND WITH CONTRAST (INCLUDING MRCP) TECHNIQUE: Multiplanar multisequence MR imaging of the abdomen was performed both before and after the administration of intravenous contrast. Heavily T2-weighted images of the biliary and pancreatic ducts were obtained, and three-dimensional MRCP images were rendered by post processing. CONTRAST:  56mL GADAVIST GADOBUTROL 1 MMOL/ML IV SOLN COMPARISON:  Right upper quadrant ultrasound dated 10/25/2020 FINDINGS: Motion degraded images. Lower chest: Lung bases are clear. Hepatobiliary: Signal loss in the liver on inphase imaging, suggesting iron deposition. No suspicious/enhancing hepatic lesions. Distended gallbladder with a 13 mm gallstone and gallbladder sludge (series 17/image 20). No associated inflammatory  changes. No intrahepatic or extrahepatic ductal dilatation. Common duct measures 7 mm. Associated 5-6 mm mid CBD stone (series 3/image 18). Pancreas:  Within normal limits. Spleen: Signal loss in the spleen on inphase imaging, suggesting iron deposition. Adrenals/Urinary Tract:  Adrenal glands are within normal limits. Kidneys are within normal limits.  No hydronephrosis. Stomach/Bowel: Stomach is notable for a large hiatal hernia/inverted intrathoracic stomach. Visualized bowel is grossly unremarkable. Vascular/Lymphatic:  No evidence of abdominal aortic aneurysm. No suspicious abdominal lymphadenopathy. Other:  No abdominal ascites. Musculoskeletal: Degenerative changes of the lumbar spine. IMPRESSION: Motion degraded images. Cholelithiasis with a suspected 5-6 mm mid CBD stone. No intrahepatic or extrahepatic ductal dilatation. Common duct measures 7 mm. Consider ERCP as clinically warranted. Cholelithiasis, without evidence of acute cholecystitis. Suspected iron deposition in the liver and spleen. Electronically Signed   By: Julian Hy M.D.   On: 10/26/2020 15:59   US ABDOMEN LIMITED RUQ (LIVER/GB)  Result Date: 10/25/2020 CLINICAL DATA:  Nausea. EXAM: ULTRASOUND ABDOMEN LIMITED RIGHT UPPER QUADRANT COMPARISON:  CT 07/06/2019. FINDINGS: Gallbladder: 1.2 cm gallstone noted. 3.2 cm sludge ball noted. Gallbladder wall thickness normal. No pericholecystic fluid. Common bile duct: Diameter: 4 mm Liver: No focal lesion identified. Within normal limits in parenchymal echogenicity. Portal vein is patent on color Doppler imaging with normal direction of blood flow towards the liver. Other: None. IMPRESSION: Prominent gallstone and sludge ball within the gallbladder noted. Gallbladder wall thickness normal. No biliary distention. Electronically Signed   By: Marcello Moores  Register   On: 10/25/2020 11:03    Scheduled Meds:  amiodarone  100 mg Oral Daily   apixaban  2.5 mg Oral BID   aspirin EC  81 mg Oral Daily    doxycycline  100 mg Oral BID   DULoxetine  60 mg Oral Daily   gabapentin  400 mg Oral QHS   lisinopril  10 mg Oral Daily   memantine  5 mg Oral BID   metoprolol tartrate  12.5 mg Oral BID   pantoprazole (PROTONIX) IV  40 mg Intravenous Q24H   prednisoLONE acetate  1 drop Left Eye Daily   rOPINIRole  2.5 mg Oral QHS   senna  1 tablet Oral QHS   silver sulfADIAZINE  1 application Topical Daily   Continuous Infusions:  0.9 % NaCl with KCl 40 mEq / L 125 mL/hr at 10/26/20 1534   cefTRIAXone (ROCEPHIN)  IV Stopped (10/26/20 0555)     LOS: 1 day   Time spent: 35 minutes.  Patrecia Pour, MD [facility] [email or web address] 10/26/2020, 5:56 PM

## 2020-10-26 NOTE — Progress Notes (Signed)
[facility] SURGICAL PROGRESS NOTE (cpt [phone])  Hospital Day(s): 1.   Interval History: Patient seen and examined, no acute events or new complaints overnight. Patient reports she is doing well. She denied any complaints of abdominal pain this afternoon. No fever, chills, nausea, emesis. Her leukocytosis did make some improvement, down to 14.4K. Renal function remains normal; sCr - 0.72; UO - 550 ccs. No electrolyte derangement. She continues to have AST/ALT elevation (322 / 445 - improved), alkaline phosphatase elevation (285 - improved), and hyperbilirubinemia to 4.0 (worse). Still with mild leukocytosis to 2.0. Plan for MRCP today.   Review of Systems:  Constitutional: denies fever, chills  HEENT: denies cough or congestion  Respiratory: denies any shortness of breath  Cardiovascular: denies chest pain or palpitations  Gastrointestinal: denies abdominal pain, N/V, or diarrhea/and bowel function as per interval history Genitourinary: denies burning with urination or urinary frequency  Vital signs in last 24 hours: [min-max] current  Temp:  [98.3 F (36.8 C)-98.9 F (37.2 C)] 98.3 F (36.8 C) (07/21 1153) Pulse Rate:  [60-100] 78 (07/21 1153) Resp:  [16-21] 16 (07/21 1153) BP: (145-189)/(78-90) 145/88 (07/21 1153) SpO2:  [95 %-100 %] 98 % (07/21 0832) Weight:  [59 kg] 59 kg (07/20 1708)       Weight: 59 kg BMI (Calculated): 24.17   Intake/Output last 2 shifts:  07/20 0701 - 07/21 0700 In: 2565.6 [I.V.:2116.6; IV Piggyback:449] Out: -    Physical Exam:  Constitutional: alert, cooperative and no distress  HENT: normocephalic without obvious abnormality  Eyes: PERRL, EOM's grossly intact and symmetric  Respiratory: breathing non-labored at rest  Cardiovascular: regular rate and sinus rhythm  Gastrointestinal: soft, non-tender, and non-distended, no rebound/guarding. Negative Murphy's Sign  Musculoskeletal: Chronic venous stasis changes to bilateral LE, history of  chronic osteomyelitis    Labs:  CBC Latest Ref Rng & Units 10/26/2020 10/25/2020 10/11/2020  WBC 4.0 - 10.5 K/uL 14.4(H) 15.2(H) 9.5  Hemoglobin 12.0 - 15.0 g/dL 11.4(L) 11.9(L) 12.0  Hematocrit 36.0 - 46.0 % 34.5(L) 35.3(L) 35.4(L)  Platelets 150 - 400 K/uL 248 258 242.0   CMP Latest Ref Rng & Units 10/26/2020 10/25/2020 10/11/2020  Glucose 70 - 99 mg/dL 125(H) 166(H) 92  BUN 8 - 23 mg/dL 13 19 19   Creatinine 0.44 - 1.00 mg/dL 0.72 0.82 0.78  Sodium 135 - 145 mmol/L 142 137 145  Potassium 3.5 - 5.1 mmol/L 4.0 3.3(L) 4.7  Chloride 98 - 111 mmol/L 107 101 107  CO2 22 - 32 mmol/L 27 23 30   Calcium 8.9 - 10.3 mg/dL 8.8(L) 9.2 9.4  Total Protein 6.5 - 8.1 g/dL 5.7(L) 6.0(L) 6.2  Total Bilirubin 0.3 - 1.2 mg/dL 4.0(H) 2.4(H) 0.4  Alkaline Phos 38 - 126 U/L 285(H) 327(H) 51  AST 15 - 41 U/L 322(H) 609(H) 13  ALT 0 - 44 U/L 445(H) 505(H) 14     Imaging studies: No new pertinent imaging studies   Assessment/Plan: (ICD-10's: K80.20) 87 y.o. female with what sounds like unwitnessed mechanical fall from bed this morning incidentally found to have large gallstone and elevated LFT/Hyperbilirubinemia without evidence of cholecystitis, complicated by pertinent comorbidities including advanced age, debilitation, and numerous comorbidities.   - Agree with plan for MRCP today   - Continue CLD for now; hold off on advancing until better clinical picture from gallbladder standpoint    - Monitor abdominal examination; on-going bowel function - Pain control prn; antiemetics prn   - Monitor leukocytosis; improving - Monitor hyperbilirubinemia; worse - No surgical  intervention warranted at the moment - Further management per primary service; we will follow    All of the above findings and recommendations were discussed with the patient, and the medical team, and all of patient's questions were answered to her expressed satisfaction.  -- Edison Simon, PA-C [facility] 10/26/2020,  12:34 PM [phone] M-F: 7am - 4pm

## 2020-10-26 NOTE — Care Management Important Message (Signed)
Important Message  Patient Details  Name: [name] MRN: [id number] Date of Birth: 07/28/1929   Medicare Important Message Given:  N/A - LOS <3 / Initial given by admissions  Initial Medicare IM reviewed with [name] by Malachy Moan, Patient Access Associate on 10/26/2020 at 10:43am.    Dannette Barbara 10/26/2020, 2:19 PM

## 2020-10-26 NOTE — Progress Notes (Signed)
Plan of care update: MRCP results, specifically CBD stone, in setting of increasing hyperbilirubinemia, were discussed with GI, Dr. Haig Prophet, here at [facility]. The physician that performs ERCP here will not return until June 30th, so transfer for ERCP was recommended. I briefly reviewed the case with [facility], on call for unassigned GI at [facility] who does believe ERCP is indicated, and can be performed there either tomorrow or Saturday, 7/23 if need be. Since pt received eliquis this morning, we will plan to transfer tomorrow to [facility], make NPO p MN and pursue ERCP if she remains clinically stable on 7/23 after 48 hour delay from last anticoagulation.   I returned to the bedside to discuss these findings and recommendations with the patient and her daughter by speaker phone. I confirmed that the risk of this procedure is not trivial for this 87 year old with CAD, PVD, AFib, and recent covid, though these conditions are very stable at this time and she requires no treatment or isolation for covid-19. She has had esophageal dilatation in the remote past >15 years ago, and tolerated well at that time. They confirm that the patient would consent to this procedure as I have explained it.   Updated RN (day and night) as well.   Vance Gather, MD 10/26/2020 7:20 PM

## 2020-10-26 NOTE — Consult Note (Signed)
[facility] Nurse Consult Note: Reason for Consult: multiple wounds from fall Wound type: trauma; multiple skin tears over the upper and lower extremities  Noted largest one appears to be right pretibial; patient self reports right elbow with scab from previous fall  Pressure Injury POA: NA Measurement: Right pretibial; 2cm x 2cmx 0.1cm Wound bed: pink, but macerated, non necrotic  Drainage (amount, consistency, odor) none Periwound: intact  Dressing procedure/placement/frequency: Single layer of xeroform to the RLE pretibial wound top with foam Silicone foam per the skin care order set for any other skin tears.    Re consult if needed, will not follow at this time. Thanks  Nickia Boesen [employer], RN,CWOCN, CNS, [place] [phone])

## 2020-10-26 NOTE — Evaluation (Signed)
Occupational Therapy Evaluation Patient Details Name: [name] MRN: [id number] DOB: September 08, 1929 Today's Date: 10/26/2020    History of Present Illness presented to ER secondary to generalized weakness, falls; admitted for management of weakness (possibly related to recent COVID-19; positive noted on 10/02/20 per chart)   Clinical Impression   [name] was seen for OT evaluation this date. Prior to hospital admission, pt was MOD I For mobility and ADLs using 4WW. Pt lives alone with caregivers 8-4 and family available in PM. Pt presents to acute OT demonstrating impaired ADL performance and functional mobility 2/2 decreased safety awareness and functional strength/balance/endurance deficits. Pt currently requires MIN A + RW for ADL t/f. CGA + single UE support grooming standing sinkside. MIN A don briefs in standing. Pt would benefit from skilled OT to address noted impairments and functional limitations (see below for any additional details) in order to maximize safety and independence while minimizing falls risk and caregiver burden. Upon hospital discharge, recommend HHOT to maximize pt safety and return to functional independence during meaningful occupations of daily life.     Follow Up Recommendations  Home health OT;Supervision/Assistance - 24 hour    Equipment Recommendations  3 in 1 bedside commode    Recommendations for Other Services       Precautions / Restrictions Precautions Precautions: Fall Restrictions Weight Bearing Restrictions: No      Mobility Bed Mobility Overal bed mobility: Needs Assistance Bed Mobility: Sit to Supine     Sit to supine: Min assist      Transfers Overall transfer level: Needs assistance Equipment used: Rolling walker (2 wheeled) Transfers: Sit to/from Stand Sit to Stand: Min assist          Balance Overall balance assessment: Needs assistance Sitting-balance support: No upper extremity supported;Feet supported Sitting  balance-Leahy Scale: Good     Standing balance support: Single extremity supported;During functional activity Standing balance-Leahy Scale: Fair                             ADL either performed or assessed with clinical judgement   ADL Overall ADL's : Needs assistance/impaired                                       General ADL Comments: MIN A + RW for ADL t/f. CGA + single UE support grooming standing sinkside. MIN A don briefs in standing      Pertinent Vitals/Pain Pain Assessment: No/denies pain     Hand Dominance     Extremity/Trunk Assessment Upper Extremity Assessment Upper Extremity Assessment: Overall WFL for tasks assessed   Lower Extremity Assessment Lower Extremity Assessment: Generalized weakness       Communication Communication Communication: HOH   Cognition Arousal/Alertness: Awake/alert Behavior During Therapy: WFL for tasks assessed/performed Overall Cognitive Status: Within Functional Limits for tasks assessed                                     General Comments  SpO2 high 80s-low 90s with poor pleth on RA    Exercises Exercises: Other exercises Other Exercises Other Exercises: Pt educated re; OT role, DME recs, d/c recs, falls prevention Other Exercises: LBD, grooming, sit>sup, sit<>stand, sitting/standing balance/tolerance   Shoulder Instructions      Home Living Family/patient  expects to be discharged to:: Private residence Living Arrangements: Alone Available Help at Discharge: Family;Personal care attendant;Available 24 hours/day Type of Home: Apartment       Home Layout: One level               Home Equipment: Walker - 4 wheels          Prior Functioning/Environment Level of Independence: Independent with assistive device(s)        Comments: Mod indep with 4WRW for ADLs, household and limited community mobilization; does endorse multiple fall history, increased in recent weeks  due to acute illness        OT Problem List: Decreased strength;Decreased activity tolerance;Decreased safety awareness;Impaired balance (sitting and/or standing)      OT Treatment/Interventions: Self-care/ADL training;Therapeutic exercise;Energy conservation;DME and/or AE instruction;Therapeutic activities;Patient/family education;Balance training    OT Goals(Current goals can be found in the care plan section) Acute Rehab OT Goals Patient Stated Goal: to get stronger OT Goal Formulation: With patient Time For Goal Achievement: 11/09/20 Potential to Achieve Goals: Good ADL Goals Pt Will Perform Grooming: with modified independence;standing (c LRAD PRN) Pt Will Perform Lower Body Dressing: with modified independence;sit to/from stand (c LRAD PRN) Pt Will Transfer to Toilet: with modified independence;ambulating;regular height toilet (c LRAD PRN)  OT Frequency: Min 2X/week    AM-PAC OT "6 Clicks" Daily Activity     Outcome Measure Help from another person eating meals?: None Help from another person taking care of personal grooming?: A Little Help from another person toileting, which includes using toliet, bedpan, or urinal?: A Little Help from another person bathing (including washing, rinsing, drying)?: A Little Help from another person to put on and taking off regular upper body clothing?: None Help from another person to put on and taking off regular lower body clothing?: A Little 6 Click Score: 20   End of Session Equipment Utilized During Treatment: Rolling walker  Activity Tolerance: Patient tolerated treatment well Patient left: in bed;with call bell/phone within reach;with bed alarm set;with nursing/sitter in room  OT Visit Diagnosis: Other abnormalities of gait and mobility (R26.89);Muscle weakness (generalized) (M62.81)                Time: 8850-2774 OT Time Calculation (min): 15 min Charges:  OT General Charges $OT Visit: 1 Visit OT Evaluation $OT Eval Low  Complexity: 1 Low OT Treatments $Self Care/Home Management : 8-22 mins  Dessie Coma, M.S. OTR/L  10/26/20, 1:56 PM  ascom [phone]

## 2020-10-26 NOTE — Evaluation (Signed)
Physical Therapy Evaluation Patient Details Name: [name] MRN: [id number] DOB: 1929-05-08 Today's Date: 10/26/2020   History of Present Illness  presented to ER secondary to generalized weakness, falls; admitted for management of weakness (possibly related to recent COVID-19; positive noted on 10/02/20 per chart)  Clinical Impression  Patient resting in bed upon arrival to session; alert and oriented to basic information, follows commands and agreeable to participation with session.  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  Endorses mild pain in L flank, FACES 4/10, described as "soreness" from fall.  Bilat UE/LE strength and ROM grossly symmetrical and WFL for basic transfers and gait; no focal weakness appreciated.  Able to complete bed mobility with min assist; sit/stand, basic transfers and gait (10') with RW, cga/min assist.  Demonstrates reciprocal stepping pattern with fair step height/length, limited heel strike/toe off (maintains weight shift in midfoot/heels); very broad turning radius, limited balance reactions evident.  Do recommend continued use of RW and +1 assist with all mobility at this time. Would benefit from skilled PT to address above deficits and promote optimal return to PLOF.; Recommend transition to HHPT with 24 hour supervision upon discharge from acute hospitalization.     Follow Up Recommendations Home health PT;Supervision/Assistance - 24 hour    Equipment Recommendations       Recommendations for Other Services       Precautions / Restrictions Precautions Precautions: Fall Restrictions Weight Bearing Restrictions: No      Mobility  Bed Mobility Overal bed mobility: Needs Assistance Bed Mobility: Supine to Sit     Supine to sit: Min assist     General bed mobility comments: assist for truncal elevation and scooting towards edge of bed    Transfers Overall transfer level: Needs assistance Equipment used: Rolling walker (2 wheeled) Transfers: Sit to/from  Stand Sit to Stand: Min assist         General transfer comment: good hand placement, sequencing; does require support of posterior LEs against seating surface to stabilize  Ambulation/Gait Ambulation/Gait assistance: Min assist;Min guard Gait Distance (Feet): 45 Feet Assistive device: Rolling walker (2 wheeled)       General Gait Details: reciprocal stepping pattern with fair step height/length, limited heel strike/toe off (maintains weight shift in midfoot/heels); very broad turning radius, limited balance reactions evident.  Stairs            Wheelchair Mobility    Modified Rankin (Stroke Patients Only)       Balance Overall balance assessment: Needs assistance Sitting-balance support: No upper extremity supported;Feet supported Sitting balance-Leahy Scale: Good     Standing balance support: Bilateral upper extremity supported Standing balance-Leahy Scale: Fair                               Pertinent Vitals/Pain Pain Assessment: Faces Faces Pain Scale: Hurts little more Pain Location: L hip/flank Pain Descriptors / Indicators: Grimacing;Sore Pain Intervention(s): Limited activity within patient's tolerance;Monitored during session;Repositioned    Home Living Family/patient expects to be discharged to:: Private residence Living Arrangements: Alone Available Help at Discharge: Family;Available PRN/intermittently Type of Home: Apartment       Home Layout: One level Home Equipment: Walker - 4 wheels      Prior Function Level of Independence: Independent with assistive device(s)         Comments: Mod indep with 4WRW for ADLs, household and limited community mobilization; does endorse multiple fall history, increased in recent weeks due to  acute illness     Hand Dominance        Extremity/Trunk Assessment   Upper Extremity Assessment Upper Extremity Assessment: Overall WFL for tasks assessed    Lower Extremity Assessment Lower  Extremity Assessment: Generalized weakness (grossly 4-/5 throughout; baseline neuropathy mid-calf distally, R > L)       Communication   Communication: HOH  Cognition Arousal/Alertness: Awake/alert Behavior During Therapy: WFL for tasks assessed/performed Overall Cognitive Status: Within Functional Limits for tasks assessed                                        General Comments      Exercises Other Exercises Other Exercises: Educated in role of PT and progressive mobility; reviewed safety techniques with transfers and gait; discussed energy conservation and activity pacing (recognition of signs/symptoms of fatigue) Other Exercises: Sit/stand, bed/chair and toilet transfer with RW, cga/min assist; sit/stand from [facility] with RW, cga/min assist; standing balance for hygiene, clothing management, cga/min assist.   Assessment/Plan    PT Assessment Patient needs continued PT services  PT Problem List Decreased strength;Decreased activity tolerance;Decreased balance;Decreased mobility;Decreased coordination;Decreased cognition;Decreased knowledge of use of DME;Decreased safety awareness;Decreased knowledge of precautions;Cardiopulmonary status limiting activity       PT Treatment Interventions DME instruction;Gait training;Functional mobility training;Therapeutic activities;Therapeutic exercise;Balance training;Patient/family education    PT Goals (Current goals can be found in the Care Plan section)  Acute Rehab PT Goals Patient Stated Goal: to get stronger PT Goal Formulation: With patient/family Time For Goal Achievement: 11/09/20 Potential to Achieve Goals: Good    Frequency Min 2X/week   Barriers to discharge        Co-evaluation               AM-PAC PT "6 Clicks" Mobility  Outcome Measure Help needed turning from your back to your side while in a flat bed without using bedrails?: None Help needed moving from lying on your back to sitting on the side  of a flat bed without using bedrails?: A Little Help needed moving to and from a bed to a chair (including a wheelchair)?: A Little Help needed standing up from a chair using your arms (e.g., wheelchair or bedside chair)?: A Little Help needed to walk in hospital room?: A Little Help needed climbing 3-5 steps with a railing? : A Little 6 Click Score: 19    End of Session Equipment Utilized During Treatment: Gait belt Activity Tolerance: Patient tolerated treatment well Patient left: on BSC with CNA at bedside; to connect alarm in chair once appropriate Nurse Communication: Mobility status PT Visit Diagnosis: Muscle weakness (generalized) (M62.81);Difficulty in walking, not elsewhere classified (R26.2)    Time: 2233-6122 PT Time Calculation (min) (ACUTE ONLY): 34 min   Charges:   PT Evaluation $PT Eval Moderate Complexity: 1 Mod PT Treatments $Therapeutic Activity: 23-37 mins       Dylin Breeden H. Owens Shark, PT, DPT, NCS 10/26/20, 10:37 AM [phone]

## 2020-10-26 NOTE — Plan of Care (Signed)
  Problem: Education: Goal: Knowledge of General Education information will improve Description: Including pain rating scale, medication(s)/side effects and non-pharmacologic comfort measures Outcome: Progressing   Problem: Clinical Measurements: Goal: Respiratory complications will improve Outcome: Progressing   Problem: Clinical Measurements: Goal: Cardiovascular complication will be avoided Outcome: Progressing   Problem: Nutrition: Goal: Adequate nutrition will be maintained Outcome: Progressing   Problem: Safety: Goal: Ability to remain free from injury will improve Outcome: Progressing   Problem: Education: Goal: Knowledge of risk factors and measures for prevention of condition will improve Outcome: Progressing   Problem: Respiratory: Goal: Will maintain a patent airway Outcome: Progressing

## 2020-10-26 NOTE — TOC Initial Note (Signed)
Transition of Care [facility]) - Initial/Assessment Note    Patient Details  Name: [name] MRN: [id number] Date of Birth: 1930/04/08  Transition of Care [facility]) CM/SW Contact:    Shelbie Hutching, RN Phone Number: 10/26/2020, 2:04 PM  Clinical Narrative:                 Patient admitted to the hospital with generalized weakness and sepsis.  RNCM was able to meet with the patient's daughter and [place] Ar and her husband at the bedside today.  [name] reports that he patient lives alone in an apartment and that the have just recently hired private caregivers to come in during the day from 8am to 4pm because patient has had several falls recently.  After the caregivers leave family provides supervision.  Patient has all needed equipment at home but the family is debating on weather or not they want a hospital bed at home.  Patient is open with [facility] for home Health services.  Message left with Judson Roch at [facility] that patient is admitted to the hospital.  Plan for discharge is for patient to return home with home health and private pay sitters.    Expected Discharge Plan: [place] Barriers to Discharge: Continued Medical Work up   Patient Goals and CMS Choice Patient states their goals for this hospitalization and ongoing recovery are:: Daughter and POA agrees with discharge back to apartment with 24 hr supervision and [facility] CMS Medicare.gov Compare Post Acute Care list provided to:: Patient Represenative (must comment) Choice offered to / list presented to : Adult Children, HC POA / Guardian  Expected Discharge Plan and Services Expected Discharge Plan: [place]   Discharge Planning Services: CM Consult Post Acute Care Choice: Resumption of Svcs/PTA Provider, Home Health Living arrangements for the past 2 months: Apartment                 DME Arranged: N/A DME Agency: NA       HH Arranged: RN, PT, OT, Nurse's Aide [place] Agency: [facility]  Health Date [place]: 10/26/20 Time [place]: 37 Representative spoke with at [place]: [place] Arrangements/Services Living arrangements for the past 2 months: [place] with:: Self Patient language and need for interpreter reviewed:: Yes Do you feel safe going back to the place where you live?: Yes      Need for Family Participation in Patient Care: Yes (Comment) Care giver support system in place?: Yes (comment) (daughter) Current home services: DME, Actuary (transport chair, 3 in 1, rollator, shower chair) Criminal Activity/Legal Involvement Pertinent to Current Situation/Hospitalization: No - Comment as needed  Activities of Daily Living Home Assistive Devices/Equipment: Environmental consultant (specify type), Other (Comment) (satndard walker) ADL Screening (condition at time of admission) Patient's cognitive ability adequate to safely complete daily activities?: No Is the patient deaf or have difficulty hearing?: Yes Does the patient have difficulty seeing, even when wearing glasses/contacts?: Yes Does the patient have difficulty concentrating, remembering, or making decisions?: No Patient able to express need for assistance with ADLs?: Yes Does the patient have difficulty dressing or bathing?: Yes Independently performs ADLs?: No Communication: Independent Dressing (OT): Needs assistance Is this a change from baseline?: Pre-admission baseline Grooming: Needs assistance Is this a change from baseline?: Pre-admission baseline Feeding: Independent Bathing: Needs assistance Is this a change from baseline?: Pre-admission baseline Toileting: Needs assistance Is this a change from baseline?: Pre-admission baseline In/Out Bed: Needs  assistance Is this a change from baseline?: Pre-admission baseline Walks in Home: Needs assistance Is this a change from baseline?: Pre-admission baseline Does the patient have difficulty walking or climbing stairs?:  Yes Weakness of Legs: Both Weakness of Arms/Hands: Both  Permission Sought/Granted Permission sought to share information with : Case Manager, Family Supports, Other (comment) Permission granted to share information with : Yes, Verbal Permission Granted  Share Information with NAME: [name]  Permission granted to share info w AGENCY: [facility]  Permission granted to share info w Relationship: daughter     Emotional Assessment Appearance:: Appears stated age     Orientation: : Oriented to Self, Oriented to Place, Oriented to  Time, Oriented to Situation Alcohol / Substance Use: Not Applicable Psych Involvement: No (comment)  Admission diagnosis:  Nausea [R11.0] Generalized weakness [R53.1] Sepsis without acute organ dysfunction, due to unspecified organism [facility]) [A41.9] Patient Active Problem List   Diagnosis Date Noted   Generalized weakness 10/25/2020   Frequent falls 10/25/2020   Transaminitis 10/25/2020   Lactic acidosis 10/25/2020   Nondisplaced fracture of proximal phalanx of right lesser toe(s), initial encounter for closed fracture 10/13/2020   COVID-19 virus infection 10/12/2020   Osteomyelitis ([place]) 10/10/2020   Skin tear of elbow without complication 50/27/7412   Pedal edema 09/22/2020   IDA (iron deficiency anemia) 06/27/2020   MCI (mild cognitive impairment) with memory loss 12/28/2019   Chronic diastolic CHF (congestive heart failure) ([place]) 10/25/2019   DNR (do not resuscitate) 10/25/2019   Closed fracture of left distal radius and ulna 10/25/2019   Closed fracture of right wrist    Palliative care by specialist    CKD (chronic kidney disease) stage 3, GFR 30-59 ml/min ([place]) 10/24/2019   Ulcer of right foot ([place]) 08/14/2019   CAD (coronary artery disease), native coronary artery 07/13/2019   Dyspnea on exertion    Pulmonary hypertension, unspecified ([place])    Urge incontinence 05/06/2019   Skin lesion on examination 05/06/2019   PAD (peripheral artery  disease) ([place]) 03/12/2019   Hypertrophic toenail 01/27/2019   Cyanosis 01/12/2019   Hemorrhoid 11/25/2018   Vitamin B12 deficiency 08/04/2018   Bilateral leg and foot pain 07/31/2018   Vitamin D deficiency 02/25/2018   Peripheral neuropathy 12/04/2017   General unsteadiness 10/01/2017   Chronic constipation 10/01/2017   Esophageal dysphagia 04/08/2017   Chronic radicular pain of lower back 04/08/2017   Fall with injury 03/04/2017   Encounter for chronic pain management 11/11/2016   Breast mass, right 09/29/2016   Abdominal aortic atherosclerosis ([place]) 09/07/2015   Insomnia 06/08/2015   Health maintenance examination 12/08/2014   Advanced care planning/counseling discussion 12/08/2014   Chronic leg pain 10/13/2014   Fatigue 10/28/2013   Persistent atrial fibrillation ([place]) 04/24/2013   Medicare annual wellness visit, subsequent 04/14/2012   Chest tightness 03/16/2012   RLS (restless legs syndrome) 02/07/2012   DDD (degenerative disc disease), lumbar 12/18/2009   MDD (major depressive disorder), recurrent episode ([place]) 12/10/2007   HLD (hyperlipidemia) 11/24/2006   PLMD (periodic limb movement disorder) 11/24/2006   Essential hypertension 11/24/2006   GERD 11/24/2006   GASTRIC ULCER 11/24/2006   Osteoarthritis 11/24/2006   Osteoporosis 11/24/2006   PCP:  Ria Bush, MD Pharmacy:   [facility] [place], [place] [address] [place] [address] Phone: [phone] Fax: [phone]     Social Determinants of Health (SDOH) Interventions    Readmission Risk Interventions No flowsheet data found.

## 2020-10-27 ENCOUNTER — Inpatient Hospital Stay: Payer: Medicare Other

## 2020-10-27 ENCOUNTER — Other Ambulatory Visit: Payer: Self-pay

## 2020-10-27 ENCOUNTER — Inpatient Hospital Stay (HOSPITAL_COMMUNITY)
Admission: AD | Admit: 2020-10-27 | Discharge: 2020-11-01 | DRG: 871 | Disposition: A | Discharge status: Home Health | Payer: Medicare Other | Source: Other Acute Inpatient Hospital | Attending: Internal Medicine | Admitting: Internal Medicine

## 2020-10-27 ENCOUNTER — Ambulatory Visit: Payer: Medicare Other | Admitting: Family Medicine

## 2020-10-27 ENCOUNTER — Telehealth: Payer: Self-pay

## 2020-10-27 ENCOUNTER — Encounter: Payer: Self-pay | Admitting: Internal Medicine

## 2020-10-27 DIAGNOSIS — I130 Hypertensive heart and chronic kidney disease with heart failure and stage 1 through stage 4 chronic kidney disease, or unspecified chronic kidney disease: Secondary | ICD-10-CM | POA: Diagnosis not present

## 2020-10-27 DIAGNOSIS — Z8739 Personal history of other diseases of the musculoskeletal system and connective tissue: Secondary | ICD-10-CM | POA: Diagnosis present

## 2020-10-27 DIAGNOSIS — I083 Combined rheumatic disorders of mitral, aortic and tricuspid valves: Secondary | ICD-10-CM | POA: Diagnosis not present

## 2020-10-27 DIAGNOSIS — R7881 Bacteremia: Secondary | ICD-10-CM | POA: Diagnosis not present

## 2020-10-27 DIAGNOSIS — F0390 Unspecified dementia without behavioral disturbance: Secondary | ICD-10-CM | POA: Diagnosis present

## 2020-10-27 DIAGNOSIS — M810 Age-related osteoporosis without current pathological fracture: Secondary | ICD-10-CM | POA: Diagnosis present

## 2020-10-27 DIAGNOSIS — I700 Atherosclerosis of aorta: Secondary | ICD-10-CM | POA: Diagnosis present

## 2020-10-27 DIAGNOSIS — K8070 Calculus of gallbladder and bile duct without cholecystitis without obstruction: Secondary | ICD-10-CM | POA: Diagnosis not present

## 2020-10-27 DIAGNOSIS — I739 Peripheral vascular disease, unspecified: Secondary | ICD-10-CM | POA: Diagnosis present

## 2020-10-27 DIAGNOSIS — J811 Chronic pulmonary edema: Secondary | ICD-10-CM | POA: Diagnosis not present

## 2020-10-27 DIAGNOSIS — G4700 Insomnia, unspecified: Secondary | ICD-10-CM | POA: Diagnosis not present

## 2020-10-27 DIAGNOSIS — N1831 Chronic kidney disease, stage 3a: Secondary | ICD-10-CM | POA: Diagnosis present

## 2020-10-27 DIAGNOSIS — Z79899 Other long term (current) drug therapy: Secondary | ICD-10-CM

## 2020-10-27 DIAGNOSIS — I5033 Acute on chronic diastolic (congestive) heart failure: Secondary | ICD-10-CM | POA: Diagnosis present

## 2020-10-27 DIAGNOSIS — K8030 Calculus of bile duct with cholangitis, unspecified, without obstruction: Secondary | ICD-10-CM | POA: Diagnosis present

## 2020-10-27 DIAGNOSIS — R945 Abnormal results of liver function studies: Secondary | ICD-10-CM | POA: Diagnosis not present

## 2020-10-27 DIAGNOSIS — I959 Hypotension, unspecified: Secondary | ICD-10-CM | POA: Diagnosis not present

## 2020-10-27 DIAGNOSIS — G2581 Restless legs syndrome: Secondary | ICD-10-CM | POA: Diagnosis not present

## 2020-10-27 DIAGNOSIS — D539 Nutritional anemia, unspecified: Secondary | ICD-10-CM | POA: Diagnosis present

## 2020-10-27 DIAGNOSIS — Z8619 Personal history of other infectious and parasitic diseases: Secondary | ICD-10-CM

## 2020-10-27 DIAGNOSIS — I4819 Other persistent atrial fibrillation: Secondary | ICD-10-CM | POA: Diagnosis present

## 2020-10-27 DIAGNOSIS — L602 Onychogryphosis: Secondary | ICD-10-CM | POA: Diagnosis not present

## 2020-10-27 DIAGNOSIS — K8020 Calculus of gallbladder without cholecystitis without obstruction: Secondary | ICD-10-CM | POA: Diagnosis present

## 2020-10-27 DIAGNOSIS — G629 Polyneuropathy, unspecified: Secondary | ICD-10-CM | POA: Diagnosis present

## 2020-10-27 DIAGNOSIS — M869 Osteomyelitis, unspecified: Secondary | ICD-10-CM | POA: Diagnosis present

## 2020-10-27 DIAGNOSIS — K5909 Other constipation: Secondary | ICD-10-CM | POA: Diagnosis present

## 2020-10-27 DIAGNOSIS — Z8616 Personal history of COVID-19: Secondary | ICD-10-CM | POA: Diagnosis not present

## 2020-10-27 DIAGNOSIS — M069 Rheumatoid arthritis, unspecified: Secondary | ICD-10-CM | POA: Diagnosis present

## 2020-10-27 DIAGNOSIS — I272 Pulmonary hypertension, unspecified: Secondary | ICD-10-CM | POA: Diagnosis not present

## 2020-10-27 DIAGNOSIS — Z419 Encounter for procedure for purposes other than remedying health state, unspecified: Secondary | ICD-10-CM

## 2020-10-27 DIAGNOSIS — R531 Weakness: Secondary | ICD-10-CM | POA: Diagnosis not present

## 2020-10-27 DIAGNOSIS — Z880 Allergy status to penicillin: Secondary | ICD-10-CM

## 2020-10-27 DIAGNOSIS — Z66 Do not resuscitate: Secondary | ICD-10-CM | POA: Diagnosis not present

## 2020-10-27 DIAGNOSIS — E785 Hyperlipidemia, unspecified: Secondary | ICD-10-CM | POA: Diagnosis not present

## 2020-10-27 DIAGNOSIS — I5032 Chronic diastolic (congestive) heart failure: Secondary | ICD-10-CM | POA: Diagnosis not present

## 2020-10-27 DIAGNOSIS — K5710 Diverticulosis of small intestine without perforation or abscess without bleeding: Secondary | ICD-10-CM | POA: Diagnosis present

## 2020-10-27 DIAGNOSIS — R0602 Shortness of breath: Secondary | ICD-10-CM | POA: Diagnosis not present

## 2020-10-27 DIAGNOSIS — Z981 Arthrodesis status: Secondary | ICD-10-CM

## 2020-10-27 DIAGNOSIS — F419 Anxiety disorder, unspecified: Secondary | ICD-10-CM | POA: Diagnosis present

## 2020-10-27 DIAGNOSIS — Z9181 History of falling: Secondary | ICD-10-CM

## 2020-10-27 DIAGNOSIS — Z87891 Personal history of nicotine dependence: Secondary | ICD-10-CM

## 2020-10-27 DIAGNOSIS — N183 Chronic kidney disease, stage 3 unspecified: Secondary | ICD-10-CM | POA: Diagnosis present

## 2020-10-27 DIAGNOSIS — K219 Gastro-esophageal reflux disease without esophagitis: Secondary | ICD-10-CM | POA: Diagnosis present

## 2020-10-27 DIAGNOSIS — I10 Essential (primary) hypertension: Secondary | ICD-10-CM | POA: Diagnosis present

## 2020-10-27 DIAGNOSIS — K8309 Other cholangitis: Secondary | ICD-10-CM | POA: Diagnosis not present

## 2020-10-27 DIAGNOSIS — I2510 Atherosclerotic heart disease of native coronary artery without angina pectoris: Secondary | ICD-10-CM | POA: Diagnosis not present

## 2020-10-27 DIAGNOSIS — U071 COVID-19: Secondary | ICD-10-CM | POA: Diagnosis present

## 2020-10-27 DIAGNOSIS — R296 Repeated falls: Secondary | ICD-10-CM | POA: Diagnosis not present

## 2020-10-27 DIAGNOSIS — B9620 Unspecified Escherichia coli [E. coli] as the cause of diseases classified elsewhere: Secondary | ICD-10-CM | POA: Diagnosis present

## 2020-10-27 DIAGNOSIS — Z7901 Long term (current) use of anticoagulants: Secondary | ICD-10-CM

## 2020-10-27 DIAGNOSIS — R7401 Elevation of levels of liver transaminase levels: Secondary | ICD-10-CM | POA: Diagnosis present

## 2020-10-27 DIAGNOSIS — M8660 Other chronic osteomyelitis, unspecified site: Secondary | ICD-10-CM | POA: Diagnosis not present

## 2020-10-27 DIAGNOSIS — E872 Acidosis: Secondary | ICD-10-CM | POA: Diagnosis not present

## 2020-10-27 DIAGNOSIS — M86671 Other chronic osteomyelitis, right ankle and foot: Secondary | ICD-10-CM | POA: Diagnosis not present

## 2020-10-27 DIAGNOSIS — Z85828 Personal history of other malignant neoplasm of skin: Secondary | ICD-10-CM

## 2020-10-27 DIAGNOSIS — K8050 Calculus of bile duct without cholangitis or cholecystitis without obstruction: Secondary | ICD-10-CM | POA: Diagnosis not present

## 2020-10-27 DIAGNOSIS — Z7982 Long term (current) use of aspirin: Secondary | ICD-10-CM

## 2020-10-27 DIAGNOSIS — Z8249 Family history of ischemic heart disease and other diseases of the circulatory system: Secondary | ICD-10-CM

## 2020-10-27 DIAGNOSIS — Z90710 Acquired absence of both cervix and uterus: Secondary | ICD-10-CM

## 2020-10-27 LAB — COMPREHENSIVE METABOLIC PANEL
ALT: 254 U/L — ABNORMAL HIGH (ref 0–44)
AST: 78 U/L — ABNORMAL HIGH (ref 15–41)
Albumin: 2.7 g/dL — ABNORMAL LOW (ref 3.5–5.0)
Alkaline Phosphatase: 234 U/L — ABNORMAL HIGH (ref 38–126)
Anion gap: 6 (ref 5–15)
BUN: 9 mg/dL (ref 8–23)
CO2: 25 mmol/L (ref 22–32)
Calcium: 7.7 mg/dL — ABNORMAL LOW (ref 8.9–10.3)
Chloride: 107 mmol/L (ref 98–111)
Creatinine, Ser: 0.74 mg/dL (ref 0.44–1.00)
GFR, Estimated: >60 mL/min (ref >60)
Glucose, Bld: 107 mg/dL — ABNORMAL HIGH (ref 70–99)
Potassium: 4.6 mmol/L (ref 3.5–5.1)
Sodium: 138 mmol/L (ref 135–145)
Total Bilirubin: 1.1 mg/dL (ref 0.3–1.2)
Total Protein: 5.7 g/dL — ABNORMAL LOW (ref 6.5–8.1)

## 2020-10-27 LAB — CREATININE, SERUM
Creatinine, Ser: 0.82 mg/dL (ref 0.44–1.00)
GFR, Estimated: >60 mL/min (ref >60)

## 2020-10-27 LAB — CBC
HCT: 35 % — ABNORMAL LOW (ref 36.0–46.0)
HCT: 32.6 % — ABNORMAL LOW (ref 36.0–46.0)
Hemoglobin: 11.2 g/dL — ABNORMAL LOW (ref 12.0–15.0)
Hemoglobin: 10.5 g/dL — ABNORMAL LOW (ref 12.0–15.0)
MCH: 33.1 pg (ref 26.0–34.0)
MCH: 33.1 pg (ref 26.0–34.0)
MCHC: 32 g/dL (ref 30.0–36.0)
MCHC: 32.2 g/dL (ref 30.0–36.0)
MCV: 103.6 fL — ABNORMAL HIGH (ref 80.0–100.0)
MCV: 102.8 fL — ABNORMAL HIGH (ref 80.0–100.0)
Platelets: 221 10*3/uL (ref 150–400)
Platelets: 240 10*3/uL (ref 150–400)
RBC: 3.38 MIL/uL — ABNORMAL LOW (ref 3.87–5.11)
RBC: 3.17 MIL/uL — ABNORMAL LOW (ref 3.87–5.11)
RDW: 14.7 % (ref 11.5–15.5)
RDW: 14.6 % (ref 11.5–15.5)
WBC: 12.2 10*3/uL — ABNORMAL HIGH (ref 4.0–10.5)
WBC: 8.9 10*3/uL (ref 4.0–10.5)
nRBC: 0 % (ref 0.0–0.2)
nRBC: 0 % (ref 0.0–0.2)

## 2020-10-27 MED ORDER — METRONIDAZOLE 500 MG/100ML IV SOLN
500.0000 mg | Freq: Three times a day (TID) | INTRAVENOUS | Status: DC
  Administered 2020-10-27: 500 mg via INTRAVENOUS
  Filled 2020-10-27 (×4): qty 100

## 2020-10-27 MED ORDER — LACTATED RINGERS IV SOLN
INTRAVENOUS | Status: DC
Start: 2020-10-27 — End: 2020-10-31

## 2020-10-27 MED ORDER — MORPHINE SULFATE (PF) 2 MG/ML IV SOLN
2.0000 mg | INTRAVENOUS | Status: DC | PRN
Start: 2020-10-27 — End: 2020-11-01
  Administered 2020-10-27 – 2020-10-28 (×3): 2 mg via INTRAVENOUS
  Filled 2020-10-27 (×4): qty 1

## 2020-10-27 MED ORDER — ONDANSETRON HCL 4 MG/2ML IJ SOLN: 4.0000 mg | Freq: Four times a day (QID) | INTRAMUSCULAR | Status: DC | PRN

## 2020-10-27 MED ORDER — ONDANSETRON HCL 4 MG PO TABS: 4.0000 mg | ORAL_TABLET | Freq: Four times a day (QID) | ORAL | Status: DC | PRN

## 2020-10-27 MED ORDER — FUROSEMIDE 10 MG/ML IJ SOLN
20.0000 mg | Freq: Once | INTRAMUSCULAR | Status: AC
  Administered 2020-10-27: 20 mg via INTRAVENOUS
  Filled 2020-10-27: qty 2

## 2020-10-27 MED ORDER — FUROSEMIDE 10 MG/ML IJ SOLN
20.0000 mg | Freq: Once | INTRAMUSCULAR | Status: AC
  Administered 2020-10-27: 14:00:00 20 mg via INTRAVENOUS
  Filled 2020-10-27: qty 2

## 2020-10-27 MED ORDER — SODIUM CHLORIDE 0.9 % IV SOLN
2.0000 g | INTRAVENOUS | Status: DC
  Administered 2020-10-27 – 2020-10-31 (×5): 2 g via INTRAVENOUS
  Filled 2020-10-27: qty 20
  Filled 2020-10-27 (×2): qty 2
  Filled 2020-10-27: qty 20
  Filled 2020-10-27: qty 2
  Filled 2020-10-27: qty 20

## 2020-10-27 MED ORDER — HEPARIN SODIUM (PORCINE) 5000 UNIT/ML IJ SOLN
5000.0000 [IU] | Freq: Three times a day (TID) | INTRAMUSCULAR | Status: DC
  Administered 2020-10-27 – 2020-10-28 (×2): 5000 [IU] via SUBCUTANEOUS
  Filled 2020-10-27 (×2): qty 1

## 2020-10-27 MED ORDER — METRONIDAZOLE 500 MG/100ML IV SOLN: 500.0000 mg | Freq: Three times a day (TID) | INTRAVENOUS | Status: DC

## 2020-10-27 MED ORDER — METRONIDAZOLE 500 MG/100ML IV SOLN
500.0000 mg | Freq: Three times a day (TID) | INTRAVENOUS | Status: DC
  Administered 2020-10-27 – 2020-11-01 (×14): 500 mg via INTRAVENOUS
  Filled 2020-10-27 (×14): qty 100

## 2020-10-27 MED ORDER — SODIUM CHLORIDE 0.9 % IV SOLN
2.0000 g | INTRAVENOUS | Status: DC
Start: 2020-10-28 — End: 2020-10-27

## 2020-10-27 NOTE — Telephone Encounter (Signed)
Patients daughter [name] called this am and wanted to make Dr. Danise Mina aware that patient fell on Wednesday. Patient had to be transported to [facility] and while there they found a gallstone in her gallbladder that will need to be taken care of. They are transferring her to [facility] to do an endoscopic procedure. [name] states she just wanted to give Dr. Darnell Level a heads up on what was going on with her mother and so he can look over the hospital notes.

## 2020-10-27 NOTE — TOC Transition Note (Signed)
Transition of Care [facility]) - CM/SW Discharge Note   Patient Details  Name: [name] MRN: [id number] Date of Birth: [birth year]  Transition of Care [facility]) CM/SW Contact:  Shelbie Hutching, RN Phone Number: 10/27/2020, 10:10 AM   Clinical Narrative:    RNCM met with patient and patient's daughter at the bedside this morning.  Patient will be transferring to [facility] today for ERCP.  Unsure of what time this will occur but patient and family agree with plan for transfer.     Final next level of care: Acute to Acute Transfer Barriers to Discharge: Continued Medical Work up   Patient Goals and CMS Choice Patient states their goals for this hospitalization and ongoing recovery are:: Daughter and POA agrees with discharge back to apartment with 24 hr supervision and [facility] CMS Medicare.gov Compare Post Acute Care list provided to:: Patient Represenative (must comment) Choice offered to / list presented to : Adult Children, [place] / [place]  Discharge Placement                       Discharge Plan and Services   Discharge Planning Services: CM Consult Post Acute Care Choice: Resumption of Svcs/PTA Provider, Home Health          DME Arranged: N/A DME Agency: NA       HH Arranged: RN, PT, OT, Nurse's Aide [place] Agency: [facility] Date [place] Agency Contacted: 10/26/20 Time [place]: [phone] Representative spoke with at [place]: [place] Determinants of Health ([place]) Interventions     Readmission Risk Interventions No flowsheet data found.

## 2020-10-27 NOTE — H&P (Signed)
History and Physical   [name] [id number] DOB: May 16, 1929 DOA: 10/27/2020  Referring MD/NP/PA: Dr Bonner Puna, [facility]  PCP: Ria Bush, MD   Outpatient Specialists: [facility] gastroenterologist   Patient coming from: [facility]  Chief Complaint: Abdominal pain  HPI: [name] is a 87 y.o. female with medical history significant of [name] is an 87 y.o. female with a history of CAD, HTN, chronic AFib, PVD, right 5th toe/metatarsal osteoarthritis on doxycycline, covid-19 diagnosed 10/02/2020 treated with paxlovid, and cholelithiasis reported to the ED from home 7/20 after 2 falls and generalized weakness. Work up revealed elevated LFTs (AST 609, ALT 505, TBili 2.4), lacitc acid elevated to 3.4, leukocytosis to 15.2k. Right foot XR confirmed erosive bony changes consistent with osteomyelitis of the 5th digit. RUQ U/S demonstrated gallstone of 1.2cm, and sludge ball of 3.2cm without wall thickening or pericholecystic fluid. Blood cultures were drawn, patient placed on zosyn with surgery consulted. Blood cultures grew E. coli for which ceftriaxone is continued. Bilirubin increased, so MRCP was performed revealing no cholecystitis, but evidence of choledocholithiasis for which ERCP is recommended and flagyl is added. The patient is now transferred to [facility] for formal GI evaluation as no ERCP capabilities are available at this time at [facility] .   Review of Systems: As per HPI otherwise 10 point review of systems negative.    Past Medical History:  Diagnosis Date   Abdominal aortic atherosclerosis ([place]) 09/2015   by xray   Anxiety    BCC (basal cell carcinoma of skin) 05/2011   on nose   BCC (basal cell carcinoma of skin) 08/09/2020   upper chest   Breast lesion    a. diagnosed with complex sclerosing lesion with calcifications of the right breast in 08/2016   Chest pain    a. nuclear stress test 03/2012: normal; b. 09/2018 MV: EF >65%, no isch/scar.    DDD (degenerative  disc disease), lumbosacral 12/06/1992   Gallstones 09/2015   incidentally by xray   GERD (gastroesophageal reflux disease) 05/1999   HERPES ZOSTER 04/13/2007   Qualifier: Diagnosis of  By: [name], [name]    Hiatal hernia    History of shingles    ophthalmic, takes acyclovir daily   Hyperlipidemia 12/2003   Hypertension 1990   Mitral regurgitation    a. echo 03/2012: EF 60-65%, no RWMA, mild to mod AI/MR, mod TR, PASP 37 mmHg   Osteoarthritis 08/21/1989   Osteoporosis with fracture 11/1997   with compression fracture T12   Persistent atrial fibrillation ([place]) 03/16/2012   a. CHADS2VASc => 5 (HTN, age x 2, vascular disease, sex category)-->Eliquis 5 BID;  b. Recurrent AF in 06/2016;  c. 01/2017 s/p DCCV;  d. 02/2017 recurrent AFib->Amio added->DCCV; e. 03/2017 recurrent AFib, s/p reload of amio and DCCV 04/02/2017   POSTHERPETIC NEURALGIA 04/20/2007   Qualifier: Diagnosis of  By: [name], Billie-Lynn Daniels    Rheumatoid arthritis ([place])    RLS (restless legs syndrome)     Past Surgical History:  Procedure Laterality Date   ABDOMINAL HYSTERECTOMY  Age 40 - 57   S/P TAH   abdominal ultrasound  04/23/2004   Gallstones   BREAST EXCISIONAL BIOPSY Right 2018   neg/benign complex sclerosing lesion   BREAST LUMPECTOMY WITH RADIOACTIVE SEED LOCALIZATION Right 10/2016   breast lumpectomy with radioactive seed localization and margin assessment for complex sclerosing lesion [facility])   BREAST LUMPECTOMY WITH RADIOACTIVE SEED LOCALIZATION Right 11/05/2016   Procedure: RIGHT BREAST LUMPECTOMY WITH  RADIOACTIVE SEED LOCALIZATION;  Surgeon: Fanny Skates, MD;  Location: [place];  Service: General;  Laterality: Right;   CARDIAC CATHETERIZATION     CARDIOVERSION N/A 01/31/2017   Procedure: CARDIOVERSION;  Surgeon: Wellington Hampshire, MD;  Location: [facility];  Service: Cardiovascular;  Laterality: N/A;   CARDIOVERSION N/A 03/03/2017   Procedure: CARDIOVERSION;   Surgeon: Wellington Hampshire, MD;  Location: [facility];  Service: Cardiovascular;  Laterality: N/A;   CARDIOVERSION N/A 04/02/2017   Procedure: CARDIOVERSION;  Surgeon: Wellington Hampshire, MD;  Location: [place] [facility];  Service: Cardiovascular;  Laterality: N/A;   CARDIOVERSION N/A 01/16/2018   Procedure: CARDIOVERSION;  Surgeon: Wellington Hampshire, MD;  Location: [facility];  Service: Cardiovascular;  Laterality: N/A;   CATARACT EXTRACTION  05/2010   left eye   CERVICAL DISCECTOMY  1992   Fusion (Dr. Joya Salm)   ESI Bilateral 01/2016   S1 transforaminal ESI x3   ESOPHAGOGASTRODUODENOSCOPY  01/01/2002   with ulcer, bx. neg; + stricture gastric ulcer with hemorrhage   ESOPHAGOGASTRODUODENOSCOPY  04/23/2004   Esophageal stricture -- dilated   INTRAVASCULAR PRESSURE WIRE/FFR STUDY N/A 07/12/2019   Procedure: INTRAVASCULAR PRESSURE WIRE/FFR STUDY;  Surgeon: Wellington Hampshire, MD;  Location: [place] [facility];  Service: Cardiovascular;  Laterality: N/A;   LOWER EXTREMITY ANGIOGRAPHY Left 02/01/2019   LOWER EXTREMITY ANGIOGRAPHY;  Surgeon: Algernon Huxley, MD   LOWER EXTREMITY ANGIOGRAPHY Right 03/25/2019   Procedure: LOWER EXTREMITY ANGIOGRAPHY;  Surgeon: Algernon Huxley, MD;  Location: [place] [facility];  Service: Cardiovascular;  Laterality: Right;   LOWER EXTREMITY ANGIOGRAPHY Right 08/02/2019   Procedure: LOWER EXTREMITY ANGIOGRAPHY;  Surgeon: Algernon Huxley, MD;  Location: [place] [facility];  Service: Cardiovascular;  Laterality: Right;   nuclear stress test  03/2012   no ischemia   PERIPHERAL VASCULAR BALLOON ANGIOPLASTY Left 01/2019   Percutaneous transluminal angioplasty of left anterior and posterior tibial artery with 2.5 mm diameter by 22 cm length angioplasty balloon ([place])   RIGHT/LEFT HEART CATH AND CORONARY ANGIOGRAPHY Bilateral 07/12/2019   Procedure: RIGHT/LEFT HEART CATH AND CORONARY ANGIOGRAPHY;  Surgeon: Wellington Hampshire, MD;  Location: [place] [facility];  Service: Cardiovascular;   Laterality: Bilateral;   SKIN CANCER EXCISION  05/20/2011   BCC from nose   US ECHOCARDIOGRAPHY  03/2012   in flutter, EF 60%, mild-mod aortic, mitral, tricuspid regurg, mildly dilated LA     reports that she quit smoking about 44 years ago. Her smoking use included cigarettes. She has a 0.25 pack-year smoking history. She has never used smokeless tobacco. She reports that she does not drink alcohol and does not use drugs.  Allergies  Allergen Reactions   Penicillins Rash and Other (See Comments)    Childhood Allergy Has patient had a PCN reaction causing immediate rash, facial/tongue/throat swelling, SOB or lightheadedness with hypotension: Yes Has patient had a PCN reaction causing severe rash involving mucus membranes or skin necrosis: Unknown Has patient had a PCN reaction that required hospitalization: No Has patient had a PCN reaction occurring within the last 10 years: No If all of the above answers are "NO", then may proceed with Cephalosporin use.     Family History  Problem Relation Age of Onset   Emphysema Mother    Heart disease Father        MI   Stroke Father        first at age 1.   Stroke Sister    Diabetes Sister  Hypertension Brother    Heart disease Brother        Heart Valve   Stroke Brother    Diabetes Brother    Cancer Brother        esophageal   Diabetes Brother    Diabetes Brother    Colon cancer Neg Hx      Prior to Admission medications   Medication Sig Start Date End Date Taking? Authorizing Provider  acyclovir (ZOVIRAX) 400 MG tablet Take 400 mg by mouth 2 (two) times daily.    [provider]  Alpha-Lipoic Acid 600 MG TABS Take by mouth.    [provider]  amiodarone (PACERONE) 200 MG tablet TAKE 1/2 TABLET ('100MG'$ ) BY MOUTH ONCE DAILY 06/28/20   Wellington Hampshire, MD  cefTRIAXone 2 g in sodium chloride 0.9 % 100 mL Inject 2 g into the vein daily. 10/28/20   Patrecia Pour, MD  ciclopirox (LOPROX) 0.77 % cream Apply  topically 2 (two) times daily. Apply to entire foot including between toes of both feet 08/09/20   Moye, [place], MD  doxycycline (VIBRA-TABS) 100 MG tablet Take 1 tablet (100 mg total) by mouth 2 (two) times daily. 10/10/20   Venia Carbon, MD  DULoxetine (CYMBALTA) 60 MG capsule TAKE 1 CAPSULE BY MOUTH ONCE DAILY 04/28/20   Ria Bush, MD  gabapentin (NEURONTIN) 400 MG capsule Take 1 capsule (400 mg total) by mouth at bedtime. With second dose during day as needed 08/18/20   Ria Bush, MD  lisinopril (ZESTRIL) 10 MG tablet TAKE 1 TABLET BY MOUTH ONCE DAILY 10/14/20   Ria Bush, MD  memantine [facility]) 5 MG tablet TAKE 1 TABLET BY MOUTH TWICE DAILY 04/07/20   Ria Bush, MD  metoprolol tartrate (LOPRESSOR) 25 MG tablet TAKE 1/2 TABLET BY MOUTH TWICE DAILY 01/13/20   Wellington Hampshire, MD  metroNIDAZOLE (FLAGYL) 500 MG/100ML Inject 100 mLs (500 mg total) into the vein every 8 (eight) hours. 10/27/20   Patrecia Pour, MD  pantoprazole (PROTONIX) 40 MG tablet TAKE 1 TABLET BY MOUTH EVERY DAY 04/27/20   Ria Bush, MD  polyethylene glycol (MIRALAX / GLYCOLAX) 17 g packet Take 17 g by mouth daily as needed for mild constipation. 10/27/19   Nolberto Hanlon, MD  prednisoLONE acetate (PRED FORTE) 1 % ophthalmic suspension Place 1 drop into the left eye daily.     [provider]  rOPINIRole (REQUIP) 1 MG tablet Take 2.5 tablets (2.5 mg total) by mouth at bedtime. 03/30/20   Ria Bush, MD  silver sulfADIAZINE (SILVADENE) 1 % cream Apply 1 application topically daily. 10/10/20   Venia Carbon, MD  Vitamin D, Ergocalciferol, (DRISDOL) 1.25 MG (50000 UNIT) CAPS capsule TAKE 1 CAPSULE BY MOUTH ONCE WEEKLY 04/25/20   Tower, Wynelle Fanny, MD    Physical Exam: Vitals:   10/27/20 2035  BP: 136/70  Pulse: 79  Resp: 18  Temp: 98.3 F (36.8 C)  TempSrc: Oral  SpO2: 98%      Constitutional: Stable no distress v Vitals:   10/27/20 2035  BP: 136/70  Pulse: 79   Resp: 18  Temp: 98.3 F (36.8 C)  TempSrc: Oral  SpO2: 98%   Eyes: PERRL, lids and conjunctivae normal ENMT: Mucous membranes are moist. Posterior pharynx clear of any exudate or lesions.Normal dentition.  Neck: normal, supple, no masses, no thyromegaly Respiratory: clear to auscultation bilaterally, no wheezing, no crackles. Normal respiratory effort. No accessory muscle use.  Cardiovascular: Regular rate and rhythm, no murmurs /  rubs / gallops. No extremity edema. 2+ pedal pulses. No carotid bruits.  Abdomen: Mid abdominal tenderness, no masses palpated. No hepatosplenomegaly. Bowel sounds positive.  Musculoskeletal: no clubbing / cyanosis. No joint deformity upper and lower extremities. Good ROM, no contractures. Normal muscle tone.  Skin: no rashes, lesions, ulcers. No induration Neurologic: CN 2-12 grossly intact. Sensation intact, DTR normal. Strength 5/5 in all 4.  Psychiatric: Normal judgment and insight. Alert and oriented x 3. Normal mood.     Labs on Admission: I have personally reviewed following labs and imaging studies  CBC: Recent Labs  Lab 10/25/20 0720 10/26/20 0038 10/27/20 0751 10/27/20 1939  WBC 15.2* 14.4* 12.2* 8.9  NEUTROABS 14.1*  --   --   --   HGB 11.9* 11.4* 11.2* 10.5*  HCT 35.3* 34.5* 35.0* 32.6*  MCV 100.3* 99.4 103.6* 102.8*  PLT 258 248 221 [id number]   Basic Metabolic Panel: Recent Labs  Lab 10/25/20 0720 10/25/20 0740 10/26/20 0038 10/27/20 1125 10/27/20 1939  NA 137  --  142 138  --   K 3.3*  --  4.0 4.6  --   CL 101  --  107 107  --   CO2 23  --  27 25  --   GLUCOSE 166*  --  125* 107*  --   BUN 19  --  13 9  --   CREATININE 0.82  --  0.72 0.74 0.82  CALCIUM 9.2  --  8.8* 7.7*  --   MG  --  1.6*  --   --   --    GFR: Estimated Creatinine Clearance: 38.8 mL/min (by C-G formula based on SCr of 0.82 mg/dL). Liver Function Tests: Recent Labs  Lab 10/25/20 0720 10/26/20 0038 10/27/20 1125  AST 609* 322* 78*  ALT 505* 445* 254*   ALKPHOS 327* 285* 234*  BILITOT 2.4* 4.0* 1.1  PROT 6.0* 5.7* 5.7*  ALBUMIN 3.0* 2.9* 2.7*   Recent Labs  Lab 10/25/20 0906  LIPASE 25   No results for input(s): AMMONIA in the last 168 hours. Coagulation Profile: No results for input(s): INR, PROTIME in the last 168 hours. Cardiac Enzymes: Recent Labs  Lab 10/25/20 1554  CKTOTAL 74   BNP (last 3 results) No results for input(s): PROBNP in the last 8760 hours. HbA1C: No results for input(s): HGBA1C in the last 72 hours. CBG: No results for input(s): GLUCAP in the last 168 hours. Lipid Profile: No results for input(s): CHOL, HDL, LDLCALC, TRIG, CHOLHDL, LDLDIRECT in the last 72 hours. Thyroid Function Tests: No results for input(s): TSH, T4TOTAL, FREET4, T3FREE, THYROIDAB in the last 72 hours. Anemia Panel: No results for input(s): VITAMINB12, FOLATE, FERRITIN, TIBC, IRON, RETICCTPCT in the last 72 hours. Urine analysis:    Component Value Date/Time   COLORURINE AMBER (A) 10/25/2020 1000   APPEARANCEUR CLEAR (A) 10/25/2020 1000   LABSPEC 1.028 10/25/2020 1000   PHURINE 6.0 10/25/2020 1000   GLUCOSEU NEGATIVE 10/25/2020 1000   HGBUR NEGATIVE 10/25/2020 1000   BILIRUBINUR MODERATE (A) 10/25/2020 1000   BILIRUBINUR negative 05/05/2019 1716   KETONESUR NEGATIVE 10/25/2020 1000   PROTEINUR 30 (A) 10/25/2020 1000   UROBILINOGEN 0.2 05/05/2019 1716   NITRITE NEGATIVE 10/25/2020 1000   LEUKOCYTESUR NEGATIVE 10/25/2020 1000   Sepsis Labs: '@LABRCNTIP'$ (procalcitonin:4,lacticidven:4) ) Recent Results (from the past 240 hour(s))  Blood culture (routine x 2)     Status: Abnormal (Preliminary result)   Collection Time: 10/25/20  8:40 AM   Specimen: BLOOD  Result  Value Ref Range Status   Specimen Description   Final    BLOOD RIGHT FA Performed at [facility], [address] [place], [place] [address]    Special Requests   Final    BOTTLES DRAWN AEROBIC AND ANAEROBIC Blood Culture results may not be optimal due  to an excessive volume of blood received in culture bottles Performed at [facility], [address] [place], [place] [address]    Culture  Setup Time   Final    GRAM NEGATIVE RODS AEROBIC BOTTLE ONLY CRITICAL RESULT CALLED TO, READ BACK BY AND VERIFIED WITH: SAMANTHA RAUER'@2229'$  10/25/20 RH    Culture (A)  Final    ESCHERICHIA COLI SUSCEPTIBILITIES TO FOLLOW Performed at [facility], [place] [address] [place], [place] [address]    Report Status PENDING  Incomplete  Blood Culture ID Panel (Reflexed)     Status: Abnormal   Collection Time: 10/25/20  8:40 AM  Result Value Ref Range Status   Enterococcus faecalis NOT DETECTED NOT DETECTED Final   Enterococcus Faecium NOT DETECTED NOT DETECTED Final   Listeria monocytogenes NOT DETECTED NOT DETECTED Final   Staphylococcus species NOT DETECTED NOT DETECTED Final   Staphylococcus aureus (BCID) NOT DETECTED NOT DETECTED Final   Staphylococcus epidermidis NOT DETECTED NOT DETECTED Final   Staphylococcus lugdunensis NOT DETECTED NOT DETECTED Final   Streptococcus species NOT DETECTED NOT DETECTED Final   Streptococcus agalactiae NOT DETECTED NOT DETECTED Final   Streptococcus pneumoniae NOT DETECTED NOT DETECTED Final   Streptococcus pyogenes NOT DETECTED NOT DETECTED Final   A.calcoaceticus-baumannii NOT DETECTED NOT DETECTED Final   Bacteroides fragilis NOT DETECTED NOT DETECTED Final   Enterobacterales DETECTED (A) NOT DETECTED Final    Comment: Enterobacterales represent a large order of gram negative bacteria, not a single organism. CRITICAL RESULT CALLED TO, READ BACK BY AND VERIFIED WITH: SAMANTHA RAUER'@2229'$  10/25/20 RH    Enterobacter cloacae complex NOT DETECTED NOT DETECTED Final   Escherichia coli DETECTED (A) NOT DETECTED Final    Comment: CRITICAL RESULT CALLED TO, READ BACK BY AND VERIFIED WITH: SAMANTHA RAUER'@2229'$  10/25/20 RH    Klebsiella aerogenes NOT DETECTED NOT DETECTED Final   Klebsiella oxytoca NOT  DETECTED NOT DETECTED Final   Klebsiella pneumoniae NOT DETECTED NOT DETECTED Final   Proteus species NOT DETECTED NOT DETECTED Final   Salmonella species NOT DETECTED NOT DETECTED Final   Serratia marcescens NOT DETECTED NOT DETECTED Final   Haemophilus influenzae NOT DETECTED NOT DETECTED Final   Neisseria meningitidis NOT DETECTED NOT DETECTED Final   Pseudomonas aeruginosa NOT DETECTED NOT DETECTED Final   Stenotrophomonas maltophilia NOT DETECTED NOT DETECTED Final   Candida albicans NOT DETECTED NOT DETECTED Final   Candida auris NOT DETECTED NOT DETECTED Final   Candida glabrata NOT DETECTED NOT DETECTED Final   Candida krusei NOT DETECTED NOT DETECTED Final   Candida parapsilosis NOT DETECTED NOT DETECTED Final   Candida tropicalis NOT DETECTED NOT DETECTED Final   Cryptococcus neoformans/gattii NOT DETECTED NOT DETECTED Final   CTX-M ESBL NOT DETECTED NOT DETECTED Final   Carbapenem resistance IMP NOT DETECTED NOT DETECTED Final   Carbapenem resistance KPC NOT DETECTED NOT DETECTED Final   Carbapenem resistance NDM NOT DETECTED NOT DETECTED Final   Carbapenem resist OXA 48 LIKE NOT DETECTED NOT DETECTED Final   Carbapenem resistance VIM NOT DETECTED NOT DETECTED Final    Comment: Performed at [facility], [address], [place], [place] [address]  Resp Panel  by RT-PCR (Flu A&B, Covid) Nasopharyngeal Swab     Status: Abnormal   Collection Time: 10/25/20  8:50 AM   Specimen: Nasopharyngeal Swab; Nasopharyngeal(NP) swabs in vial transport medium  Result Value Ref Range Status   SARS Coronavirus 2 by RT PCR POSITIVE (A) NEGATIVE Final    Comment: RESULT CALLED TO, READ BACK BY AND VERIFIED WITH: Rosemary Holms, RN 1215 10/25/20 GM (NOTE) SARS-CoV-2 target nucleic acids are DETECTED.  The SARS-CoV-2 RNA is generally detectable in upper respiratory specimens during the acute phase of infection. Positive results are indicative of the presence of the identified virus, but  do not rule out bacterial infection or co-infection with other pathogens not detected by the test. Clinical correlation with patient history and other diagnostic information is necessary to determine patient infection status. The expected result is Negative.  Fact Sheet for Patients: EntrepreneurPulse.com.au  Fact Sheet for Healthcare Providers: IncredibleEmployment.be  This test is not yet approved or cleared by the [place] FDA and  has been authorized for detection and/or diagnosis of SARS-CoV-2 by FDA under an Emergency Use Authorization (EUA).  This EUA will remain in effect (meaning this test can be [place] ed) for the duration of  the COVID-19 declaration under Section 564(b)(1) of the Act, 21 U.S.C. section 360bbb-3(b)(1), unless the authorization is terminated or revoked sooner.     Influenza A by PCR NEGATIVE NEGATIVE Final   Influenza B by PCR NEGATIVE NEGATIVE Final    Comment: (NOTE) The Xpert Xpress SARS-CoV-2/FLU/RSV plus assay is intended as an aid in the diagnosis of influenza from Nasopharyngeal swab specimens and should not be used as a sole basis for treatment. Nasal washings and aspirates are unacceptable for Xpert Xpress SARS-CoV-2/FLU/RSV testing.  Fact Sheet for Patients: EntrepreneurPulse.com.au  Fact Sheet for Healthcare Providers: IncredibleEmployment.be  This test is not yet approved or cleared by the [place] FDA and has been authorized for detection and/or diagnosis of SARS-CoV-2 by FDA under an Emergency Use Authorization (EUA). This EUA will remain in effect (meaning this test can be used) for the duration of the COVID-19 declaration under Section 564(b)(1) of the Act, 21 U.S.C. section 360bbb-3(b)(1), unless the authorization is terminated or revoked.  Performed at [facility], [address] [place], [place] [address]   Blood culture (routine x 2)      Status: None (Preliminary result)   Collection Time: 10/25/20  8:52 AM   Specimen: BLOOD  Result Value Ref Range Status   Specimen Description   Final    BLOOD RIGHT [facility] Performed at [facility], [address], [place], [place] [address]    Special Requests   Final    BOTTLES DRAWN AEROBIC AND ANAEROBIC Blood Culture adequate volume Performed at [facility], [address], [place], [place] [address]    Culture  Setup Time   Final    GRAM NEGATIVE RODS AEROBIC BOTTLE ONLY CRITICAL VALUE NOTED.  VALUE IS CONSISTENT WITH PREVIOUSLY REPORTED AND CALLED VALUE.    Culture GRAM NEGATIVE RODS  Final   Report Status PENDING  Incomplete  MRSA Next Gen by PCR, Nasal     Status: None   Collection Time: 10/26/20 12:36 AM   Specimen: Nasal Mucosa; Nasal Swab  Result Value Ref Range Status   MRSA by PCR Next Gen NOT DETECTED NOT DETECTED Final    Comment: (NOTE) The GeneXpert MRSA Assay (FDA approved for NASAL specimens only), is one component of a comprehensive MRSA colonization surveillance program. It  is not intended to diagnose MRSA infection nor to guide or monitor treatment for MRSA infections. Test performance is not FDA approved in patients less than 27 years old. Performed at [facility], [address], [place], [place] [address]      Radiological Exams on Admission: MR 3D Recon At Scanner  Result Date: 10/26/2020 CLINICAL DATA:  Jaundice, cholelithiasis EXAM: MRI ABDOMEN WITHOUT AND WITH CONTRAST (INCLUDING MRCP) TECHNIQUE: Multiplanar multisequence MR imaging of the abdomen was performed both before and after the administration of intravenous contrast. Heavily T2-weighted images of the biliary and pancreatic ducts were obtained, and three-dimensional MRCP images were rendered by post processing. CONTRAST:  64m GADAVIST GADOBUTROL 1 MMOL/ML IV SOLN COMPARISON:  Right upper quadrant ultrasound dated 10/25/2020 FINDINGS: Motion degraded images. Lower  chest: Lung bases are clear. Hepatobiliary: Signal loss in the liver on inphase imaging, suggesting iron deposition. No suspicious/enhancing hepatic lesions. Distended gallbladder with a 13 mm gallstone and gallbladder sludge (series 17/image 20). No associated inflammatory changes. No intrahepatic or extrahepatic ductal dilatation. Common duct measures 7 mm. Associated 5-6 mm mid CBD stone (series 3/image 18). Pancreas:  Within normal limits. Spleen: Signal loss in the spleen on inphase imaging, suggesting iron deposition. Adrenals/Urinary Tract:  Adrenal glands are within normal limits. Kidneys are within normal limits.  No hydronephrosis. Stomach/Bowel: Stomach is notable for a large hiatal hernia/inverted intrathoracic stomach. Visualized bowel is grossly unremarkable. Vascular/Lymphatic:  No evidence of abdominal aortic aneurysm. No suspicious abdominal lymphadenopathy. Other:  No abdominal ascites. Musculoskeletal: Degenerative changes of the lumbar spine. IMPRESSION: Motion degraded images. Cholelithiasis with a suspected 5-6 mm mid CBD stone. No intrahepatic or extrahepatic ductal dilatation. Common duct measures 7 mm. Consider ERCP as clinically warranted. Cholelithiasis, without evidence of acute cholecystitis. Suspected iron deposition in the liver and spleen. Electronically Signed   By: SJulian HyM.D.   On: 10/26/2020 15:59   DG Chest Port 1 View  Result Date: 10/27/2020 CLINICAL DATA:  Shortness of breath EXAM: PORTABLE CHEST 1 VIEW COMPARISON:  10/25/2020 FINDINGS: Mild interstitial pulmonary edema. No pleural effusion or pneumothorax. No focal consolidation. Normal cardiomediastinal contours. IMPRESSION: Mild interstitial pulmonary edema. Electronically Signed   By: KUlyses JarredM.D.   On: 10/27/2020 01:35   MR ABDOMEN MRCP W WO CONTAST  Result Date: 10/26/2020 CLINICAL DATA:  Jaundice, cholelithiasis EXAM: MRI ABDOMEN WITHOUT AND WITH CONTRAST (INCLUDING MRCP) TECHNIQUE: Multiplanar  multisequence MR imaging of the abdomen was performed both before and after the administration of intravenous contrast. Heavily T2-weighted images of the biliary and pancreatic ducts were obtained, and three-dimensional MRCP images were rendered by post processing. CONTRAST:  574mGADAVIST GADOBUTROL 1 MMOL/ML IV SOLN COMPARISON:  Right upper quadrant ultrasound dated 10/25/2020 FINDINGS: Motion degraded images. Lower chest: Lung bases are clear. Hepatobiliary: Signal loss in the liver on inphase imaging, suggesting iron deposition. No suspicious/enhancing hepatic lesions. Distended gallbladder with a 13 mm gallstone and gallbladder sludge (series 17/image 20). No associated inflammatory changes. No intrahepatic or extrahepatic ductal dilatation. Common duct measures 7 mm. Associated 5-6 mm mid CBD stone (series 3/image 18). Pancreas:  Within normal limits. Spleen: Signal loss in the spleen on inphase imaging, suggesting iron deposition. Adrenals/Urinary Tract:  Adrenal glands are within normal limits. Kidneys are within normal limits.  No hydronephrosis. Stomach/Bowel: Stomach is notable for a large hiatal hernia/inverted intrathoracic stomach. Visualized bowel is grossly unremarkable. Vascular/Lymphatic:  No evidence of abdominal aortic aneurysm. No suspicious abdominal lymphadenopathy. Other:  No abdominal ascites. Musculoskeletal: Degenerative changes  of the lumbar spine. IMPRESSION: Motion degraded images. Cholelithiasis with a suspected 5-6 mm mid CBD stone. No intrahepatic or extrahepatic ductal dilatation. Common duct measures 7 mm. Consider ERCP as clinically warranted. Cholelithiasis, without evidence of acute cholecystitis. Suspected iron deposition in the liver and spleen. Electronically Signed   By: Julian Hy M.D.   On: 10/26/2020 15:59      Assessment/Plan Principal Problem:   Choledocholithiasis Active Problems:   HLD (hyperlipidemia)   Essential hypertension   GERD   Pulmonary  hypertension, unspecified (HCC)   CAD (coronary artery disease), native coronary artery   CKD (chronic kidney disease) stage 3, GFR 30-59 ml/min (HCC)   Chronic diastolic CHF (congestive heart failure) ([place])   DNR (do not resuscitate)   Osteomyelitis ([place])   COVID-19 virus infection   Bacteremia due to Gram-negative bacteria     #1 E. coli bacteremia: Patient currently on ceftriaxone and Flagyl for suspected bacteremia from GI source.  She has osteomyelitis but down the antibiotic course.  Continue antibiotics.  Follow culture results to conclusion.  #2 choledocholithiasis: GI already consulted as above.  Patient will have ERCP.  Continue supportive care.  Patient will be seen by Dr. Silvano Rusk.  #3 COVID-19 infection: Already treated with Paxlovid.  No further treatment plan.  Out of isolation.  #3 osteomyelitis of the right fifth metatarsal head: Empirically on doxycycline.  Continue treatment  #5 chronic kidney disease stage IIIa: Stable at baseline.  Continue monitoring.  #6 acute on chronic diastolic heart failure: We will be careful with fluid and monitor closely.  #7 chronic A. fib: Rate is controlled.  Eliquis being held for procedure.  Continue to monitor  #8 generalized debility: PT and OT after procedure  #9 essential hypertension: Controlled.  #10 peripheral vascular disease: Has been on aspirin and statin which are being held now.     DVT prophylaxis: Heparin  Code Status: DNR  Family Communication: None at bedside  Disposition Plan: Home  Consults called: [facility]  Admission status: Inpatient   Severity of Illness: The appropriate patient status for this patient is INPATIENT. Inpatient status is judged to be reasonable and necessary in order to provide the required intensity of service to ensure the patient's safety. The patient's presenting symptoms, physical exam findings, and initial radiographic and laboratory data in the context of their  chronic comorbidities is felt to place them at high risk for further clinical deterioration. Furthermore, it is not anticipated that the patient will be medically stable for discharge from the hospital within 2 midnights of admission. The following factors support the patient status of inpatient.   " The patient's presenting symptoms include Abdominal pain. " The worrisome physical exam findings include Tenderness of abdomen. " The initial radiographic and laboratory data are worrisome because of Choledocholithiasis. " The chronic co-morbidities include Chronic afib.   * I certify that at the point of admission it is my clinical judgment that the patient will require inpatient hospital care spanning beyond 2 midnights from the point of admission due to high intensity of service, high risk for further deterioration and high frequency of surveillance required.Barbette Merino MD [facility] Pager [phone]  If 7PM-7AM, please contact night-coverage [email or web address] Password [email or web address]  10/27/2020, 11:00 PM

## 2020-10-27 NOTE — Plan of Care (Signed)
Pt daughter came in at [facility] and requested pt be given med for anxiety. Pt compliant of pain. Prudy Feeler MD contacted and Morphine and Xanax ordered and given. BP elevated 184 SBP and hydralazine given. Upon midnight vitals Respirations elevated 30. MD contacted and MEWs documentation completed. See previous note for additional info.  Problem: Clinical Measurements: Goal: Ability to maintain clinical measurements within normal limits will improve Outcome: Not Progressing Goal: Respiratory complications will improve Outcome: Not Progressing Goal: Cardiovascular complication will be avoided Outcome: Not Progressing   Problem: Education: Goal: Knowledge of General Education information will improve Description: Including pain rating scale, medication(s)/side effects and non-pharmacologic comfort measures Outcome: Progressing   Problem: Health Behavior/Discharge Planning: Goal: Ability to manage health-related needs will improve Outcome: Progressing   Problem: Clinical Measurements: Goal: Will remain free from infection Outcome: Progressing Goal: Diagnostic test results will improve Outcome: Progressing   Problem: Activity: Goal: Risk for activity intolerance will decrease Outcome: Progressing   Problem: Nutrition: Goal: Adequate nutrition will be maintained Outcome: Progressing   Problem: Coping: Goal: Level of anxiety will decrease Outcome: Progressing   Problem: Elimination: Goal: Will not experience complications related to bowel motility Outcome: Progressing Goal: Will not experience complications related to urinary retention Outcome: Progressing   Problem: Pain Managment: Goal: General experience of comfort will improve Outcome: Progressing   Problem: Safety: Goal: Ability to remain free from injury will improve Outcome: Progressing   Problem: Skin Integrity: Goal: Risk for impaired skin integrity will decrease Outcome: Progressing   Problem: Education: Goal:  Knowledge of risk factors and measures for prevention of condition will improve Outcome: Progressing   Problem: Coping: Goal: Psychosocial and spiritual needs will be supported Outcome: Progressing   Problem: Respiratory: Goal: Will maintain a patent airway Outcome: Progressing Goal: Complications related to the disease process, condition or treatment will be avoided or minimized Outcome: Progressing

## 2020-10-27 NOTE — Progress Notes (Signed)
[facility] SURGICAL PROGRESS NOTE (cpt [phone])  Hospital Day(s): 2.   Interval History: Patient seen and examined, no acute events or new complaints overnight. Patient reports she is doing well, no complaints of abdominal pain. She denies fever, chills, nausea, emesis. Leukocytosis continues to improve; down to 12.2K. CMP pending. MRCP concerning for choledocholithiasis, and she is pending transfer to [facility] today as ERCP is not available at this facility currently.   Review of Systems:  Constitutional: denies fever, chills  HEENT: denies cough or congestion  Respiratory: denies any shortness of breath  Cardiovascular: denies chest pain or palpitations  Gastrointestinal: denies abdominal pain, N/V, or diarrhea/and bowel function as per interval history Genitourinary: denies burning with urination or urinary frequency   Vital signs in last 24 hours: [min-max] current  Temp:  [97.9 F (36.6 C)-99.5 F (37.5 C)] 98.7 F (37.1 C) (07/22 0456) Pulse Rate:  [68-89] 70 (07/22 0456) Resp:  [15-30] 24 (07/22 0456) BP: (127-184)/(66-88) 128/66 (07/22 0456) SpO2:  [92 %-99 %] 97 % (07/22 0456)     Height: 5' 1.5" (156.2 cm) Weight: 59 kg BMI (Calculated): 24.17   Intake/Output last 2 shifts:  07/21 0701 - 07/22 0700 In: 2938.4 [P.O.:480; I.V.:2358.4; IV Piggyback:100] Out: 2100 [Urine:2100]   Physical Exam:  Constitutional: alert, cooperative and no distress HENT: normocephalic without obvious abnormality Eyes: PERRL, EOM's grossly intact and symmetric Respiratory: breathing non-labored at rest Cardiovascular: regular rate and sinus rhythm Gastrointestinal: soft, non-tender, and non-distended, no rebound/guarding. Negative Murphy's Sign  Musculoskeletal: Chronic venous stasis changes to bilateral LE, history of chronic osteomyelitis    Labs:  CBC Latest Ref Rng & Units 10/26/2020 10/25/2020 10/11/2020  WBC 4.0 - 10.5 K/uL 14.4(H) 15.2(H) 9.5  Hemoglobin 12.0 - 15.0 g/dL  11.4(L) 11.9(L) 12.0  Hematocrit 36.0 - 46.0 % 34.5(L) 35.3(L) 35.4(L)  Platelets 150 - 400 K/uL 248 258 242.0   CMP Latest Ref Rng & Units 10/26/2020 10/25/2020 10/11/2020  Glucose 70 - 99 mg/dL 125(H) 166(H) 92  BUN 8 - 23 mg/dL '13 19 19  '$ Creatinine 0.44 - 1.00 mg/dL 0.72 0.82 0.78  Sodium 135 - 145 mmol/L 142 137 145  Potassium 3.5 - 5.1 mmol/L 4.0 3.3(L) 4.7  Chloride 98 - 111 mmol/L 107 101 107  CO2 22 - 32 mmol/L '27 23 30  '$ Calcium 8.9 - 10.3 mg/dL 8.8(L) 9.2 9.4  Total Protein 6.5 - 8.1 g/dL 5.7(L) 6.0(L) 6.2  Total Bilirubin 0.3 - 1.2 mg/dL 4.0(H) 2.4(H) 0.4  Alkaline Phos 38 - 126 U/L 285(H) 327(H) 51  AST 15 - 41 U/L 322(H) 609(H) 13  ALT 0 - 44 U/L 445(H) 505(H) 14     Imaging studies: No new pertinent imaging studies   Assessment/Plan: (ICD-10's: K51.20) 87 y.o. female with what sounds like unwitnessed mechanical fall from bed this morning incidentally found to have large gallstone and elevated LFT/Hyperbilirubinemia found to have choledocholithiasis on MRCP (07/21) without evidence of cholecystitis, complicated by pertinent comorbidities including advanced age, debilitation, and numerous comorbidities   - Agree with need for ERCP given MRCP findings; pending transfer to [facility] as we are without ECRP availability here at this facility  - Recommend NPO as to not delay any planned GI procedures   - Monitor abdominal examination; on-going bowel function - Pain control prn; antiemetics prn              - Monitor leukocytosis; pending - Monitor hyperbilirubinemia; pending - Will need surgical consultation once transferred  - Further management per  primary service; we will follow while here    All of the above findings and recommendations were discussed with the patient, and the medical team, and all of patient's questions were answered to herexpressed satisfaction.  -- Edison Simon, PA-C [facility] 10/27/2020, 7:12 AM [phone] M-F: 7am - 4pm

## 2020-10-27 NOTE — Discharge Summary (Addendum)
Physician Discharge Summary  [name] [id number] DOB: 01/08/1930 DOA: 10/25/2020  PCP: Ria Bush, MD  Admit date: 10/25/2020 Discharge date: 10/27/2020  Admitted From: Home Disposition: [facility]   Recommendations: - [name] GI consultation for ERCP. NPO, hold eliquis and aspirin. - Continue ceftriaxone, flagyl for Cholangitis and E. coli bacteremia. - Continue doxycycline for right foot osteomyelitis.  Discharge Condition: Stable CODE STATUS: DNR Diet recommendation: NPO  Brief/Interim Summary: [name] is an 87 y.o. female with a history of CAD, HTN, chronic AFib, PVD, right 5th toe/metatarsal osteoarthritis on doxycycline, covid-19 diagnosed 10/02/2020 treated with paxlovid, and cholelithiasis reported to the ED from home 7/20 after 2 falls and generalized weakness. Work up revealed elevated LFTs (AST 609, ALT 505, TBili 2.4), lacitc acid elevated to 3.4, leukocytosis to 15.2k. Right foot XR confirmed erosive bony changes consistent with osteomyelitis of the 5th digit. RUQ U/S demonstrated gallstone of 1.2cm, and sludge ball of 3.2cm without wall thickening or pericholecystic fluid. Blood cultures were drawn, patient placed on zosyn with surgery consulted. Blood cultures grew E. coli for which ceftriaxone is continued. Bilirubin increased, so MRCP was performed revealing no cholecystitis, but evidence of choledocholithiasis for which ERCP is recommended and flagyl is added. The patient will be transferred to [facility] for formal GI evaluation as no ERCP capabilities are available at this time at [facility].   Discharge Diagnoses:  Principal Problem:   Generalized weakness Active Problems:   Essential hypertension   PAD (peripheral artery disease) (HCC)   CAD (coronary artery disease), native coronary artery   CKD (chronic kidney disease) stage 3, GFR 30-59 ml/min (HCC)   Chronic diastolic CHF (congestive heart failure) (HCC)   Osteomyelitis (HCC)    Frequent falls   Transaminitis   Lactic acidosis  E. coli bacteremia: Suspect GI source more than osteomyelitis at this time. No pyuria or bacteriuria or symptoms of UTI. - Continue ceftriaxone 2g IV q24h pending susceptibility testing. - Add flagyl with concern for cholangitis. D/w ID pharmacist.    Obstructive choledocholithiasis:  - ERCP recommended. Dr. Haig Prophet of GI confirms no ERCP capabilities at this facility until July 30. I spoke with Dr. Carlean Purl and Dr. Rush Landmark yesterday and today who will see formally on arrival to [facility], confirmed their ability to perform ERCP over this weekend. Will need stable respiratory status, holding eliquis. Verbal consent for transfer and ERCP obtained after pt/family discussion.   Osteomyelitis of right fifth metatarsal head, proximal 5th phalanx: - Continue doxycycline.   Acute hypoxic respiratory failure due to acute on chronic HFpEF: - Given IVF for bacteremia and poor po intake, though CXR last night revealing interstitial edema and some peripheral edema noted this morning. Will give lasix '20mg'$  IV x1 and stop IVF for now.   Stage IIIa CKD: Stable.  - Avoid nephrotoxins, monitor renal function with lasix administration 7/22.  Generalized weakness, falls: Possibly related to blood stream infection. - PT, OT - Fall precautions   History of covid-19 infection: +10/02/2020, treated with paxlovid. - No further isolation or management required.   Chronic atrial fibrillation: - Continue amiodarone (this is typically a contraindication to paxlovid Tx), metoprolol. - Hold eliquis for now with plans for ERCP (needs at least 48 hour washout, last dose 7/21 ~8am).    HTN: - Restarted home lisinopril and metoprolol with elevated BP.    PVD: - Hold ASA with procedural plans. Holding statin with LFT derangements. Confirms statin (as well as eliquis) was held during paxlovid treatment.  Hypokalemia: Resolved. - Monitor in AM.   Lactic acidosis:  Likely related to bacteremia. Improving.  Discharge Instructions  Allergies as of 10/27/2020       Reactions   Penicillins Rash, Other (See Comments)   Childhood Allergy Has patient had a PCN reaction causing immediate rash, facial/tongue/throat swelling, SOB or lightheadedness with hypotension: Yes Has patient had a PCN reaction causing severe rash involving mucus membranes or skin necrosis: Unknown Has patient had a PCN reaction that required hospitalization: No Has patient had a PCN reaction occurring within the last 10 years: No If all of the above answers are "NO", then may proceed with Cephalosporin use.        Medication List     STOP taking these medications    acetaminophen 500 MG tablet Commonly known as: TYLENOL   aspirin EC 81 MG tablet   atorvastatin 20 MG tablet Commonly known as: LIPITOR   Eliquis 2.5 MG Tabs tablet Generic drug: apixaban   HYDROcodone-acetaminophen 5-325 MG tablet Commonly known as: NORCO/VICODIN   senna 8.6 MG Tabs tablet Commonly known as: SENOKOT   tiZANidine 2 MG tablet Commonly known as: ZANAFLEX   traMADol 50 MG tablet Commonly known as: ULTRAM       TAKE these medications    acyclovir 400 MG tablet Commonly known as: ZOVIRAX Take 400 mg by mouth 2 (two) times daily.   Alpha-Lipoic Acid 600 MG Tabs Take by mouth.   amiodarone 200 MG tablet Commonly known as: PACERONE TAKE 1/2 TABLET ('100MG'$ ) BY MOUTH ONCE DAILY   cefTRIAXone 2 g in sodium chloride 0.9 % 100 mL Inject 2 g into the vein daily. Start taking on: October 28, 2020   ciclopirox 0.77 % cream Commonly known as: LOPROX Apply topically 2 (two) times daily. Apply to entire foot including between toes of both feet   doxycycline 100 MG tablet Commonly known as: VIBRA-TABS Take 1 tablet (100 mg total) by mouth 2 (two) times daily.   DULoxetine 60 MG capsule Commonly known as: CYMBALTA TAKE 1 CAPSULE BY MOUTH ONCE DAILY   gabapentin 400 MG  capsule Commonly known as: Neurontin Take 1 capsule (400 mg total) by mouth at bedtime. With second dose during day as needed   lisinopril 10 MG tablet Commonly known as: ZESTRIL TAKE 1 TABLET BY MOUTH ONCE DAILY   memantine 5 MG tablet Commonly known as: NAMENDA TAKE 1 TABLET BY MOUTH TWICE DAILY   metoprolol tartrate 25 MG tablet Commonly known as: LOPRESSOR TAKE 1/2 TABLET BY MOUTH TWICE DAILY   metroNIDAZOLE 500 MG/100ML Commonly known as: FLAGYL Inject 100 mLs (500 mg total) into the vein every 8 (eight) hours.   pantoprazole 40 MG tablet Commonly known as: PROTONIX TAKE 1 TABLET BY MOUTH EVERY DAY   polyethylene glycol 17 g packet Commonly known as: MIRALAX / GLYCOLAX Take 17 g by mouth daily as needed for mild constipation.   prednisoLONE acetate 1 % ophthalmic suspension Commonly known as: PRED FORTE Place 1 drop into the left eye daily.   rOPINIRole 1 MG tablet Commonly known as: REQUIP Take 2.5 tablets (2.5 mg total) by mouth at bedtime.   silver sulfADIAZINE 1 % cream Commonly known as: SILVADENE Apply 1 application topically daily.   Vitamin D (Ergocalciferol) 1.25 MG (50000 UNIT) Caps capsule Commonly known as: DRISDOL TAKE 1 CAPSULE BY MOUTH ONCE WEEKLY        Allergies  Allergen Reactions   Penicillins Rash and Other (See Comments)    Childhood  Allergy Has patient had a PCN reaction causing immediate rash, facial/tongue/throat swelling, SOB or lightheadedness with hypotension: Yes Has patient had a PCN reaction causing severe rash involving mucus membranes or skin necrosis: Unknown Has patient had a PCN reaction that required hospitalization: No Has patient had a PCN reaction occurring within the last 10 years: No If all of the above answers are "NO", then may proceed with Cephalosporin use.     Consultations: General surgery GI  Procedures/Studies: DG Chest 1 View  Result Date: 10/25/2020 CLINICAL DATA:  Weakness. EXAM: CHEST  1 VIEW  COMPARISON:  01/19/2020.  CT 07/06/2019. FINDINGS: Cardiomegaly. No pulmonary venous congestion. Low lung volumes. Mild left base atelectasis/scarring. No focal infiltrate. No pleural effusion or pneumothorax. Degenerative change and scoliosis thoracic spine. Reference is made to prior chest CT report for discussion of thoracic spine compression fractures. Surgical clips right chest IMPRESSION: 1.  Cardiomegaly.  No pulmonary venous congestion. 2.  No acute pulmonary disease. Electronically Signed   By: Marcello Moores  Register   On: 10/25/2020 08:18   CT Head Wo Contrast  Result Date: 10/25/2020 CLINICAL DATA:  Head trauma, neck trauma EXAM: CT HEAD WITHOUT CONTRAST CT CERVICAL SPINE WITHOUT CONTRAST TECHNIQUE: Multidetector CT imaging of the head and cervical spine was performed following the standard protocol without intravenous contrast. Multiplanar CT image reconstructions of the cervical spine were also generated. COMPARISON:  07/03/2018 FINDINGS: CT HEAD FINDINGS Brain: No evidence of acute infarction, hemorrhage, hydrocephalus, extra-axial collection or mass lesion/mass effect. Moderate low-density changes within the periventricular and subcortical white matter compatible with chronic microvascular ischemic change. Mild diffuse cerebral volume loss. Vascular: Atherosclerotic calcifications involving the large vessels of the skull base. No unexpected hyperdense vessel. Skull: Normal. Negative for fracture or focal lesion. Sinuses/Orbits: No acute finding. Other: Negative for scalp hematoma. CT CERVICAL SPINE FINDINGS Alignment: Facet joints are aligned without dislocation or traumatic listhesis. Dens and lateral masses are aligned. Straightening of the cervical lordosis. Similar degree of grade 1 anterolisthesis of C3 on C4. Slight retrolisthesis of C5 on C6. Skull base and vertebrae: No acute fracture. No primary bone lesion or focal pathologic process. Soft tissues and spinal canal: No prevertebral fluid or  swelling. No visible canal hematoma. Disc levels: Multilevel degenerative disc disease, most pronounced at C6-7. Multilevel bilateral facet joint arthropathy, slightly more pronounced on the right. No significant interval progression from prior. Upper chest: Included lung apices demonstrate biapical pleuroparenchymal scarring, similar to prior. Other: Bilateral carotid atherosclerosis. IMPRESSION: 1. No acute intracranial findings. 2. No evidence of acute traumatic injury to the cervical spine. 3. Moderate chronic microvascular ischemic changes and cerebral volume loss. 4. Multilevel cervical spondylosis, similar to prior. Electronically Signed   By: Davina Poke D.O.   On: 10/25/2020 08:21   CT Cervical Spine Wo Contrast  Result Date: 10/25/2020 CLINICAL DATA:  Head trauma, neck trauma EXAM: CT HEAD WITHOUT CONTRAST CT CERVICAL SPINE WITHOUT CONTRAST TECHNIQUE: Multidetector CT imaging of the head and cervical spine was performed following the standard protocol without intravenous contrast. Multiplanar CT image reconstructions of the cervical spine were also generated. COMPARISON:  07/03/2018 FINDINGS: CT HEAD FINDINGS Brain: No evidence of acute infarction, hemorrhage, hydrocephalus, extra-axial collection or mass lesion/mass effect. Moderate low-density changes within the periventricular and subcortical white matter compatible with chronic microvascular ischemic change. Mild diffuse cerebral volume loss. Vascular: Atherosclerotic calcifications involving the large vessels of the skull base. No unexpected hyperdense vessel. Skull: Normal. Negative for fracture or focal lesion. Sinuses/Orbits: No acute finding. Other:  Negative for scalp hematoma. CT CERVICAL SPINE FINDINGS Alignment: Facet joints are aligned without dislocation or traumatic listhesis. Dens and lateral masses are aligned. Straightening of the cervical lordosis. Similar degree of grade 1 anterolisthesis of C3 on C4. Slight retrolisthesis of  C5 on C6. Skull base and vertebrae: No acute fracture. No primary bone lesion or focal pathologic process. Soft tissues and spinal canal: No prevertebral fluid or swelling. No visible canal hematoma. Disc levels: Multilevel degenerative disc disease, most pronounced at C6-7. Multilevel bilateral facet joint arthropathy, slightly more pronounced on the right. No significant interval progression from prior. Upper chest: Included lung apices demonstrate biapical pleuroparenchymal scarring, similar to prior. Other: Bilateral carotid atherosclerosis. IMPRESSION: 1. No acute intracranial findings. 2. No evidence of acute traumatic injury to the cervical spine. 3. Moderate chronic microvascular ischemic changes and cerebral volume loss. 4. Multilevel cervical spondylosis, similar to prior. Electronically Signed   By: Davina Poke D.O.   On: 10/25/2020 08:21   MR 3D Recon At Scanner  Result Date: 10/26/2020 CLINICAL DATA:  Jaundice, cholelithiasis EXAM: MRI ABDOMEN WITHOUT AND WITH CONTRAST (INCLUDING MRCP) TECHNIQUE: Multiplanar multisequence MR imaging of the abdomen was performed both before and after the administration of intravenous contrast. Heavily T2-weighted images of the biliary and pancreatic ducts were obtained, and three-dimensional MRCP images were rendered by post processing. CONTRAST:  15m GADAVIST GADOBUTROL 1 MMOL/ML IV SOLN COMPARISON:  Right upper quadrant ultrasound dated 10/25/2020 FINDINGS: Motion degraded images. Lower chest: Lung bases are clear. Hepatobiliary: Signal loss in the liver on inphase imaging, suggesting iron deposition. No suspicious/enhancing hepatic lesions. Distended gallbladder with a 13 mm gallstone and gallbladder sludge (series 17/image 20). No associated inflammatory changes. No intrahepatic or extrahepatic ductal dilatation. Common duct measures 7 mm. Associated 5-6 mm mid CBD stone (series 3/image 18). Pancreas:  Within normal limits. Spleen: Signal loss in the spleen  on inphase imaging, suggesting iron deposition. Adrenals/Urinary Tract:  Adrenal glands are within normal limits. Kidneys are within normal limits.  No hydronephrosis. Stomach/Bowel: Stomach is notable for a large hiatal hernia/inverted intrathoracic stomach. Visualized bowel is grossly unremarkable. Vascular/Lymphatic:  No evidence of abdominal aortic aneurysm. No suspicious abdominal lymphadenopathy. Other:  No abdominal ascites. Musculoskeletal: Degenerative changes of the lumbar spine. IMPRESSION: Motion degraded images. Cholelithiasis with a suspected 5-6 mm mid CBD stone. No intrahepatic or extrahepatic ductal dilatation. Common duct measures 7 mm. Consider ERCP as clinically warranted. Cholelithiasis, without evidence of acute cholecystitis. Suspected iron deposition in the liver and spleen. Electronically Signed   By: SJulian HyM.D.   On: 10/26/2020 15:59   DG Chest Port 1 View  Result Date: 10/27/2020 CLINICAL DATA:  Shortness of breath EXAM: PORTABLE CHEST 1 VIEW COMPARISON:  10/25/2020 FINDINGS: Mild interstitial pulmonary edema. No pleural effusion or pneumothorax. No focal consolidation. Normal cardiomediastinal contours. IMPRESSION: Mild interstitial pulmonary edema. Electronically Signed   By: KUlyses JarredM.D.   On: 10/27/2020 01:35   DG Foot Complete Left  Result Date: 10/25/2020 CLINICAL DATA:  Soft tissue wound, possible osteomyelitis EXAM: LEFT FOOT - COMPLETE 3+ VIEW COMPARISON:  None. FINDINGS: Small soft tissue defect is noted laterally adjacent to the head of the fifth metatarsal. No underlying bony abnormality is seen. No acute fracture or dislocation is noted. IMPRESSION: Soft tissue changes without acute erosive change to suggest osteomyelitis. Electronically Signed   By: MInez CatalinaM.D.   On: 10/25/2020 09:16   DG Foot Complete Right  Result Date: 10/25/2020 CLINICAL DATA:  Evaluation for osteomyelitis.  EXAM: RIGHT FOOT COMPLETE - 3+ VIEW COMPARISON:  MRI  11/01/2019.  Right foot series 10/23/2019. FINDINGS: Erosive changes noted the distal aspect of the right fifth metatarsal and at the base of the proximal phalanx of the right fifth digit. Findings consistent with septic arthritis/osteomyelitis. No fracture or dislocation. No radiopaque foreign body. IMPRESSION: Erosive changes noted about the distal aspect of the right fifth metatarsal and at the base of the proximal phalanx of the right fifth digit. Findings consistent with septic arthritis/osteomyelitis. Electronically Signed   By: Marcello Moores  Register   On: 10/25/2020 09:17   DG Foot Complete Right  Result Date: 10/12/2020 CLINICAL DATA:  Chronic wound on the lateral aspect of the right foot. Pain and swelling. EXAM: RIGHT FOOT COMPLETE - 3+ VIEW COMPARISON:  Plain films right foot 10/23/2019. FINDINGS: There is bony destructive change in the distal 1.3 cm of the fifth metatarsal consistent with osteomyelitis. Bony destructive change is also seen in the articular surface of the base of the fifth toe consistent with osteomyelitis. There appears to be a nondisplaced fracture through the neck of the fifth toe. No other acute bony abnormality is identified. Scattered interphalangeal joint osteoarthritis is noted. IMPRESSION: Findings consistent with osteomyelitis in the distal fifth metatarsal and base of the proximal phalanx of the fifth toe. Likely transverse fracture through the neck of the proximal phalanx of the fifth toe is also identified and may be secondary to infection as no history of trauma. Electronically Signed   By: Inge Rise M.D.   On: 10/12/2020 13:39   MR ABDOMEN MRCP W WO CONTAST  Result Date: 10/26/2020 CLINICAL DATA:  Jaundice, cholelithiasis EXAM: MRI ABDOMEN WITHOUT AND WITH CONTRAST (INCLUDING MRCP) TECHNIQUE: Multiplanar multisequence MR imaging of the abdomen was performed both before and after the administration of intravenous contrast. Heavily T2-weighted images of the biliary  and pancreatic ducts were obtained, and three-dimensional MRCP images were rendered by post processing. CONTRAST:  3m GADAVIST GADOBUTROL 1 MMOL/ML IV SOLN COMPARISON:  Right upper quadrant ultrasound dated 10/25/2020 FINDINGS: Motion degraded images. Lower chest: Lung bases are clear. Hepatobiliary: Signal loss in the liver on inphase imaging, suggesting iron deposition. No suspicious/enhancing hepatic lesions. Distended gallbladder with a 13 mm gallstone and gallbladder sludge (series 17/image 20). No associated inflammatory changes. No intrahepatic or extrahepatic ductal dilatation. Common duct measures 7 mm. Associated 5-6 mm mid CBD stone (series 3/image 18). Pancreas:  Within normal limits. Spleen: Signal loss in the spleen on inphase imaging, suggesting iron deposition. Adrenals/Urinary Tract:  Adrenal glands are within normal limits. Kidneys are within normal limits.  No hydronephrosis. Stomach/Bowel: Stomach is notable for a large hiatal hernia/inverted intrathoracic stomach. Visualized bowel is grossly unremarkable. Vascular/Lymphatic:  No evidence of abdominal aortic aneurysm. No suspicious abdominal lymphadenopathy. Other:  No abdominal ascites. Musculoskeletal: Degenerative changes of the lumbar spine. IMPRESSION: Motion degraded images. Cholelithiasis with a suspected 5-6 mm mid CBD stone. No intrahepatic or extrahepatic ductal dilatation. Common duct measures 7 mm. Consider ERCP as clinically warranted. Cholelithiasis, without evidence of acute cholecystitis. Suspected iron deposition in the liver and spleen. Electronically Signed   By: SJulian HyM.D.   On: 10/26/2020 15:59   VAS [place]I WITH/WO TBI  Result Date: 09/29/2020  LOWER EXTREMITY DOPPLER STUDY Patient Name:  CBRENLIE CZAPLA Date of Exam:   09/29/2020 Medical Rec #: [id number]       Accession #:    [id number] of Birth: [birth year]  Patient Gender: F Patient Age:   6Y Exam Location:  [facility] Procedure:       VAS [place] ABI WITH/WO TBI Referring Phys: [id number] Wallace --------------------------------------------------------------------------------  Indications: Peripheral artery disease.  Vascular Interventions: 08/02/19 rt PTA of calf vessels. Performing Technologist: Concha Norway RVT  Examination Guidelines: A complete evaluation includes at minimum, Doppler waveform signals and systolic blood pressure reading at the level of bilateral brachial, anterior tibial, and posterior tibial arteries, when vessel segments are accessible. Bilateral testing is considered an integral part of a complete examination. Photoelectric Plethysmograph (PPG) waveforms and toe systolic pressure readings are included as required and additional duplex testing as needed. Limited examinations for reoccurring indications may be performed as noted.  ABI Findings: +---------+------------------+-----+--------+--------+ Right    Rt Pressure (mmHg)IndexWaveformComment  +---------+------------------+-----+--------+--------+ Brachial 155                                     +---------+------------------+-----+--------+--------+ ATA      169               1.09 biphasic         +---------+------------------+-----+--------+--------+ PTA      179               1.15 biphasic         +---------+------------------+-----+--------+--------+ Great Toe103               0.66 Abnormal         +---------+------------------+-----+--------+--------+ +---------+------------------+-----+--------+-------+ Left     Lt Pressure (mmHg)IndexWaveformComment +---------+------------------+-----+--------+-------+ ATA      159               1.03 biphasic        +---------+------------------+-----+--------+-------+ PTA      160               1.03 biphasic        +---------+------------------+-----+--------+-------+ Great Toe91                0.59 Abnormal        +---------+------------------+-----+--------+-------+  +-------+-----------+-----------+------------+------------+ ABI/TBIToday's ABIToday's TBIPrevious ABIPrevious TBI +-------+-----------+-----------+------------+------------+ Right  1.15       .66        1.08        .60          +-------+-----------+-----------+------------+------------+ Left   1.03       .59        1.11        .65          +-------+-----------+-----------+------------+------------+ Compared to prior study on 05/2020.  Summary: Right: Resting right ankle-brachial index is within normal range. No evidence of significant right lower extremity arterial disease. The right toe-brachial index is abnormal. Left: Resting left ankle-brachial index is within normal range. No evidence of significant left lower extremity arterial disease. The left toe-brachial index is abnormal.  *See table(s) above for measurements and observations.  Electronically signed by Leotis Pain MD on 09/29/2020 at 11:45:10 AM.    Final    US ABDOMEN LIMITED RUQ (LIVER/GB)  Result Date: 10/25/2020 CLINICAL DATA:  Nausea. EXAM: ULTRASOUND ABDOMEN LIMITED RIGHT UPPER QUADRANT COMPARISON:  CT 07/06/2019. FINDINGS: Gallbladder: 1.2 cm gallstone noted. 3.2 cm sludge ball noted. Gallbladder wall thickness normal. No pericholecystic fluid. Common bile duct: Diameter: 4 mm Liver: No focal lesion identified. Within normal limits in parenchymal echogenicity. Portal vein is patent on color Doppler imaging  with normal direction of blood flow towards the liver. Other: None. IMPRESSION: Prominent gallstone and sludge ball within the gallbladder noted. Gallbladder wall thickness normal. No biliary distention. Electronically Signed   By: Marcello Moores  Register   On: 10/25/2020 11:03    Subjective: Got medications for anxiety last night and was put on oxygen while sleeping. Denies dyspnea or chest pain. Still no abdominal pain, nausea, vomiting. Afebrile.  Discharge Exam: Vitals:   10/27/20 0805 10/27/20 1212  BP: (!) 143/68 (!)  144/74  Pulse: 73 63  Resp: 20 20  Temp: 98.6 F (37 C) 98.8 F (37.1 C)  SpO2: 99% 99%   General: Pleasant, elderly female in no distress Cardiovascular: Irreg irreg, 1+ pitting dependent edema Respiratory: Nonlabored, clear Abdominal: Soft, tender on lateral left abd with light palpation, NT in epigastrium/RUQ, ND, bowel sounds +  Labs: Basic Metabolic Panel: Recent Labs  Lab 10/25/20 0720 10/25/20 0740 10/26/20 0038 10/27/20 1125  NA 137  --  142 138  K 3.3*  --  4.0 4.6  CL 101  --  107 107  CO2 23  --  27 25  GLUCOSE 166*  --  125* 107*  BUN 19  --  13 9  CREATININE 0.82  --  0.72 0.74  CALCIUM 9.2  --  8.8* 7.7*  MG  --  1.6*  --   --    Liver Function Tests: Recent Labs  Lab 10/25/20 0720 10/26/20 0038 10/27/20 1125  AST 609* 322* 78*  ALT 505* 445* 254*  ALKPHOS 327* 285* 234*  BILITOT 2.4* 4.0* 1.1  PROT 6.0* 5.7* 5.7*  ALBUMIN 3.0* 2.9* 2.7*   Recent Labs  Lab 10/25/20 0906  LIPASE 25   CBC: Recent Labs  Lab 10/25/20 0720 10/26/20 0038 10/27/20 0751  WBC 15.2* 14.4* 12.2*  NEUTROABS 14.1*  --   --   HGB 11.9* 11.4* 11.2*  HCT 35.3* 34.5* 35.0*  MCV 100.3* 99.4 103.6*  PLT 258 248 221   Cardiac Enzymes: Recent Labs  Lab 10/25/20 1554  CKTOTAL 74   Urinalysis    Component Value Date/Time   COLORURINE AMBER (A) 10/25/2020 1000   APPEARANCEUR CLEAR (A) 10/25/2020 1000   LABSPEC 1.028 10/25/2020 1000   PHURINE 6.0 10/25/2020 1000   GLUCOSEU NEGATIVE 10/25/2020 1000   HGBUR NEGATIVE 10/25/2020 1000   BILIRUBINUR MODERATE (A) 10/25/2020 1000   BILIRUBINUR negative 05/05/2019 1716   KETONESUR NEGATIVE 10/25/2020 1000   PROTEINUR 30 (A) 10/25/2020 1000   UROBILINOGEN 0.2 05/05/2019 1716   NITRITE NEGATIVE 10/25/2020 1000   LEUKOCYTESUR NEGATIVE 10/25/2020 1000    Microbiology Recent Results (from the past 240 hour(s))  Blood culture (routine x 2)     Status: Abnormal (Preliminary result)   Collection Time: 10/25/20  8:40 AM    Specimen: BLOOD  Result Value Ref Range Status   Specimen Description   Final    BLOOD RIGHT FA Performed at [facility], [address] [place], [place] [address]    Special Requests   Final    BOTTLES DRAWN AEROBIC AND ANAEROBIC Blood Culture results may not be optimal due to an excessive volume of blood received in culture bottles Performed at [facility], [address] [place], [place] [address]    Culture  Setup Time   Final    GRAM NEGATIVE RODS AEROBIC BOTTLE ONLY CRITICAL RESULT CALLED TO, READ BACK BY AND VERIFIED WITH: SAMANTHA RAUER'@2229'$  10/25/20 RH    Culture (A)  Final  ESCHERICHIA COLI SUSCEPTIBILITIES TO FOLLOW Performed at [facility], [place] [address] [place], [place] [address]    Report Status PENDING  Incomplete  Blood Culture ID Panel (Reflexed)     Status: Abnormal   Collection Time: 10/25/20  8:40 AM  Result Value Ref Range Status   Enterococcus faecalis NOT DETECTED NOT DETECTED Final   Enterococcus Faecium NOT DETECTED NOT DETECTED Final   Listeria monocytogenes NOT DETECTED NOT DETECTED Final   Staphylococcus species NOT DETECTED NOT DETECTED Final   Staphylococcus aureus (BCID) NOT DETECTED NOT DETECTED Final   Staphylococcus epidermidis NOT DETECTED NOT DETECTED Final   Staphylococcus lugdunensis NOT DETECTED NOT DETECTED Final   Streptococcus species NOT DETECTED NOT DETECTED Final   Streptococcus agalactiae NOT DETECTED NOT DETECTED Final   Streptococcus pneumoniae NOT DETECTED NOT DETECTED Final   Streptococcus pyogenes NOT DETECTED NOT DETECTED Final   A.calcoaceticus-baumannii NOT DETECTED NOT DETECTED Final   Bacteroides fragilis NOT DETECTED NOT DETECTED Final   Enterobacterales DETECTED (A) NOT DETECTED Final    Comment: Enterobacterales represent a large order of gram negative bacteria, not a single organism. CRITICAL RESULT CALLED TO, READ BACK BY AND VERIFIED WITH: SAMANTHA RAUER'@2229'$  10/25/20 RH     Enterobacter cloacae complex NOT DETECTED NOT DETECTED Final   Escherichia coli DETECTED (A) NOT DETECTED Final    Comment: CRITICAL RESULT CALLED TO, READ BACK BY AND VERIFIED WITH: SAMANTHA RAUER'@2229'$  10/25/20 RH    Klebsiella aerogenes NOT DETECTED NOT DETECTED Final   Klebsiella oxytoca NOT DETECTED NOT DETECTED Final   Klebsiella pneumoniae NOT DETECTED NOT DETECTED Final   Proteus species NOT DETECTED NOT DETECTED Final   Salmonella species NOT DETECTED NOT DETECTED Final   Serratia marcescens NOT DETECTED NOT DETECTED Final   Haemophilus influenzae NOT DETECTED NOT DETECTED Final   Neisseria meningitidis NOT DETECTED NOT DETECTED Final   Pseudomonas aeruginosa NOT DETECTED NOT DETECTED Final   Stenotrophomonas maltophilia NOT DETECTED NOT DETECTED Final   Candida albicans NOT DETECTED NOT DETECTED Final   Candida auris NOT DETECTED NOT DETECTED Final   Candida glabrata NOT DETECTED NOT DETECTED Final   Candida krusei NOT DETECTED NOT DETECTED Final   Candida parapsilosis NOT DETECTED NOT DETECTED Final   Candida tropicalis NOT DETECTED NOT DETECTED Final   Cryptococcus neoformans/gattii NOT DETECTED NOT DETECTED Final   CTX-M ESBL NOT DETECTED NOT DETECTED Final   Carbapenem resistance IMP NOT DETECTED NOT DETECTED Final   Carbapenem resistance KPC NOT DETECTED NOT DETECTED Final   Carbapenem resistance NDM NOT DETECTED NOT DETECTED Final   Carbapenem resist OXA 48 LIKE NOT DETECTED NOT DETECTED Final   Carbapenem resistance VIM NOT DETECTED NOT DETECTED Final    Comment: Performed at [facility], [address] [place], [place] [address]  Resp Panel by RT-PCR (Flu A&B, Covid) Nasopharyngeal Swab     Status: Abnormal   Collection Time: 10/25/20  8:50 AM   Specimen: Nasopharyngeal Swab; Nasopharyngeal(NP) swabs in vial transport medium  Result Value Ref Range Status   SARS Coronavirus 2 by RT PCR POSITIVE (A) NEGATIVE Final    Comment: RESULT CALLED TO, READ BACK BY  AND VERIFIED WITH: Rosemary Holms, RN 1215 10/25/20 GM (NOTE) SARS-CoV-2 target nucleic acids are DETECTED.  The SARS-CoV-2 RNA is generally detectable in upper respiratory specimens during the acute phase of infection. Positive results are indicative of the presence of the identified virus, but do not rule out bacterial infection or co-infection with other pathogens not detected  by the test. Clinical correlation with patient history and other diagnostic information is necessary to determine patient infection status. The expected result is Negative.  Fact Sheet for Patients: EntrepreneurPulse.com.au  Fact Sheet for Healthcare Providers: IncredibleEmployment.be  This test is not yet approved or cleared by the [place] FDA and  has been authorized for detection and/or diagnosis of SARS-CoV-2 by FDA under an Emergency Use Authorization (EUA).  This EUA will remain in effect (meaning this test can be [place] ed) for the duration of  the COVID-19 declaration under Section 564(b)(1) of the Act, 21 U.S.C. section 360bbb-3(b)(1), unless the authorization is terminated or revoked sooner.     Influenza A by PCR NEGATIVE NEGATIVE Final   Influenza B by PCR NEGATIVE NEGATIVE Final    Comment: (NOTE) The Xpert Xpress SARS-CoV-2/FLU/RSV plus assay is intended as an aid in the diagnosis of influenza from Nasopharyngeal swab specimens and should not be used as a sole basis for treatment. Nasal washings and aspirates are unacceptable for Xpert Xpress SARS-CoV-2/FLU/RSV testing.  Fact Sheet for Patients: EntrepreneurPulse.com.au  Fact Sheet for Healthcare Providers: IncredibleEmployment.be  This test is not yet approved or cleared by the [place] FDA and has been authorized for detection and/or diagnosis of SARS-CoV-2 by FDA under an Emergency Use Authorization (EUA). This EUA will remain in effect (meaning this test  can be used) for the duration of the COVID-19 declaration under Section 564(b)(1) of the Act, 21 U.S.C. section 360bbb-3(b)(1), unless the authorization is terminated or revoked.  Performed at [facility], [address] [place], [place] [address]   Blood culture (routine x 2)     Status: None (Preliminary result)   Collection Time: 10/25/20  8:52 AM   Specimen: BLOOD  Result Value Ref Range Status   Specimen Description   Final    BLOOD RIGHT [facility] Performed at [facility], [address] [place], [place] [address]    Special Requests   Final    BOTTLES DRAWN AEROBIC AND ANAEROBIC Blood Culture adequate volume Performed at [facility], [address], [place], [place] [address]    Culture  Setup Time   Final    GRAM NEGATIVE RODS AEROBIC BOTTLE ONLY CRITICAL VALUE NOTED.  VALUE IS CONSISTENT WITH PREVIOUSLY REPORTED AND CALLED VALUE.    Culture GRAM NEGATIVE RODS  Final   Report Status PENDING  Incomplete  MRSA Next Gen by PCR, Nasal     Status: None   Collection Time: 10/26/20 12:36 AM   Specimen: Nasal Mucosa; Nasal Swab  Result Value Ref Range Status   MRSA by PCR Next Gen NOT DETECTED NOT DETECTED Final    Comment: (NOTE) The GeneXpert MRSA Assay (FDA approved for NASAL specimens only), is one component of a comprehensive MRSA colonization surveillance program. It is not intended to diagnose MRSA infection nor to guide or monitor treatment for MRSA infections. Test performance is not FDA approved in patients less than 52 years old. Performed at [facility], [address] [place],  [address]     Time coordinating discharge: Approximately 40 minutes  Patrecia Pour, MD  [facility] 10/27/2020, 12:40 PM

## 2020-10-27 NOTE — Progress Notes (Signed)
New patient admitted to the unit. 2L [place], awake and oriented. Scattered bruising and redness.

## 2020-10-27 NOTE — Progress Notes (Signed)
   10/27/20 0028  Assess: MEWS Score  Temp 99.5 F (37.5 C) (obtained Micha Dosanjh Rn)  BP (!) 154/73  Pulse Rate 88  Resp (!) 30  SpO2 93 %  O2 Device Nasal Cannula  O2 Flow Rate (L/min) 3 L/min  Assess: MEWS Score  MEWS Temp 0  MEWS Systolic 0  MEWS Pulse 0  MEWS RR 2  MEWS LOC 0  MEWS Score 2  MEWS Score Color Yellow  Assess: if the MEWS score is Yellow or Red  Were vital signs taken at a resting state? Yes  Focused Assessment Change from prior assessment (see assessment flowsheet)  Does the patient meet 2 or more of the SIRS criteria? No  MEWS guidelines implemented *See Row Information* Yes  Treat  MEWS Interventions Escalated (See documentation below)  Patients response to intervention Effective  Take Vital Signs  Increase Vital Sign Frequency  Yellow: Q 2hr X 2 then Q 4hr X 2, if remains yellow, continue Q 4hrs  Escalate  MEWS: Escalate Yellow: discuss with charge nurse/RN and consider discussing with provider and RRT  Notify: Charge Nurse/RN  Name of Charge Nurse/RN Notified Kennyth Lose Rn  Date Charge Nurse/RN Notified 10/27/20  Time Charge Nurse/RN Notified 0037  Notify: Provider  Provider Name/Title H. Damita Dunnings MD  Date Provider Notified 10/27/20  Time Provider Notified [phone]  Notification Type Page Shea Evans)  Notification Reason Change in status;Other (Comment) (yellow mews crackles wheezing oxygen applied due to sats 80%)  Provider response See new orders (Lasix '20mg'$  and Cxray)  Date of Provider Response 10/27/20  Time of Provider Response [phone]  Document  Patient Outcome Stabilized after interventions  Progress note created (see row info) Yes  Assess: SIRS CRITERIA  SIRS Temperature  0  SIRS Pulse 0  SIRS Respirations  1  SIRS WBC 0  SIRS Score Sum  1  Pt placed on 4L/[place]. MD contacted Lasix ordered and given Cxray completed indicating mild interstitial edema.

## 2020-10-27 NOTE — Progress Notes (Signed)
Called and updated daughter [name] of events during overnight. Daughter would like for Dr. Bonner Puna to call her when she speaks to her mother.

## 2020-10-28 DIAGNOSIS — K8050 Calculus of bile duct without cholangitis or cholecystitis without obstruction: Secondary | ICD-10-CM

## 2020-10-28 DIAGNOSIS — R7881 Bacteremia: Secondary | ICD-10-CM | POA: Diagnosis not present

## 2020-10-28 LAB — CBC
HCT: 30.8 % — ABNORMAL LOW (ref 36.0–46.0)
Hemoglobin: 9.9 g/dL — ABNORMAL LOW (ref 12.0–15.0)
MCH: 32.9 pg (ref 26.0–34.0)
MCHC: 32.1 g/dL (ref 30.0–36.0)
MCV: 102.3 fL — ABNORMAL HIGH (ref 80.0–100.0)
Platelets: 237 10*3/uL (ref 150–400)
RBC: 3.01 MIL/uL — ABNORMAL LOW (ref 3.87–5.11)
RDW: 14.5 % (ref 11.5–15.5)
WBC: 6.8 10*3/uL (ref 4.0–10.5)
nRBC: 0 % (ref 0.0–0.2)

## 2020-10-28 LAB — CULTURE, BLOOD (ROUTINE X 2): Special Requests: ADEQUATE

## 2020-10-28 LAB — COMPREHENSIVE METABOLIC PANEL
ALT: 194 U/L — ABNORMAL HIGH (ref 0–44)
AST: 43 U/L — ABNORMAL HIGH (ref 15–41)
Albumin: 2.3 g/dL — ABNORMAL LOW (ref 3.5–5.0)
Alkaline Phosphatase: 208 U/L — ABNORMAL HIGH (ref 38–126)
Anion gap: 6 (ref 5–15)
BUN: 7 mg/dL — ABNORMAL LOW (ref 8–23)
CO2: 24 mmol/L (ref 22–32)
Calcium: 7.4 mg/dL — ABNORMAL LOW (ref 8.9–10.3)
Chloride: 106 mmol/L (ref 98–111)
Creatinine, Ser: 0.74 mg/dL (ref 0.44–1.00)
GFR, Estimated: >60 mL/min (ref >60)
Glucose, Bld: 89 mg/dL (ref 70–99)
Potassium: 4 mmol/L (ref 3.5–5.1)
Sodium: 136 mmol/L (ref 135–145)
Total Bilirubin: 0.9 mg/dL (ref 0.3–1.2)
Total Protein: 4.8 g/dL — ABNORMAL LOW (ref 6.5–8.1)

## 2020-10-28 LAB — PROTIME-INR
INR: 1.2 (ref 0.8–1.2)
Prothrombin Time: 14.9 seconds (ref 11.4–15.2)

## 2020-10-28 MED ORDER — MEMANTINE HCL 5 MG PO TABS
5.0000 mg | ORAL_TABLET | Freq: Two times a day (BID) | ORAL | Status: DC
  Administered 2020-10-28 – 2020-11-01 (×9): 5 mg via ORAL
  Filled 2020-10-28 (×11): qty 1

## 2020-10-28 MED ORDER — POLYETHYLENE GLYCOL 3350 17 G PO PACK
17.0000 g | PACK | Freq: Every day | ORAL | Status: DC
  Administered 2020-10-28 – 2020-11-01 (×4): 17 g via ORAL
  Filled 2020-10-28 (×5): qty 1

## 2020-10-28 MED ORDER — DULOXETINE HCL 60 MG PO CPEP
60.0000 mg | ORAL_CAPSULE | Freq: Every day | ORAL | Status: DC
  Administered 2020-10-28 – 2020-11-01 (×5): 60 mg via ORAL
  Filled 2020-10-28 (×5): qty 1

## 2020-10-28 MED ORDER — TIZANIDINE HCL 2 MG PO TABS
2.0000 mg | ORAL_TABLET | Freq: Every day | ORAL | Status: DC
  Administered 2020-10-28 – 2020-11-01 (×5): 2 mg via ORAL
  Filled 2020-10-28 (×5): qty 1

## 2020-10-28 MED ORDER — HEPARIN (PORCINE) 25000 UT/250ML-% IV SOLN
1000.0000 [IU]/h | INTRAVENOUS | Status: AC
  Administered 2020-10-28: 850 [IU]/h via INTRAVENOUS
  Administered 2020-10-29: 1000 [IU]/h via INTRAVENOUS
  Filled 2020-10-28 (×3): qty 250

## 2020-10-28 MED ORDER — AMIODARONE HCL 200 MG PO TABS
100.0000 mg | ORAL_TABLET | Freq: Every day | ORAL | Status: DC
  Administered 2020-10-28 – 2020-11-01 (×5): 100 mg via ORAL
  Filled 2020-10-28 (×6): qty 1

## 2020-10-28 MED ORDER — METOPROLOL TARTRATE 12.5 MG HALF TABLET
12.5000 mg | ORAL_TABLET | Freq: Two times a day (BID) | ORAL | Status: DC
  Administered 2020-10-28 – 2020-10-31 (×7): 12.5 mg via ORAL
  Filled 2020-10-28 (×7): qty 1

## 2020-10-28 MED ORDER — GABAPENTIN 400 MG PO CAPS
400.0000 mg | ORAL_CAPSULE | Freq: Every day | ORAL | Status: DC
  Administered 2020-10-28 – 2020-10-30 (×3): 400 mg via ORAL
  Filled 2020-10-28 (×3): qty 1

## 2020-10-28 MED ORDER — ROPINIROLE HCL 0.5 MG PO TABS
2.5000 mg | ORAL_TABLET | Freq: Every day | ORAL | Status: DC
  Administered 2020-10-28 – 2020-10-31 (×4): 2.5 mg via ORAL
  Filled 2020-10-28 (×4): qty 5

## 2020-10-28 MED ORDER — LISINOPRIL 10 MG PO TABS
10.0000 mg | ORAL_TABLET | Freq: Every day | ORAL | Status: DC
  Administered 2020-10-28 – 2020-10-31 (×4): 10 mg via ORAL
  Filled 2020-10-28 (×4): qty 1

## 2020-10-28 NOTE — Consult Note (Signed)
 Referring Provider: Dr. Jai-Gurmukh Samtani  Primary Care Physician:  Gutierrez, Javier, MD Primary Gastroenterologist:  Dr. Kaplan 2003- 2005  Reason for Consultation: Choledocholithiasis, elevated LFTs  HPI: [name] is a 87 y.o. female with a past medical history of hypertension, coronary artery disease, atrial fibrillation, mitral regurgitation, osteoarthritis, CKD stage IIIa, right fifth toe/metatarsal osteomyelitis, COVID-19 diagnosed 10/02/2020 treated with Paxlovid, GERD, gastric ulcer and esophageal stricture s/p esophageal dilatation 2003 and 2005.   She developed generalized weakness and fell twice at home on 10/25/2020.  She presented to [facility] ED for further evaluation.  Labs in the ED showed elevated total bilirubin level of 2.4.  AST 609.  ALT 505.  Lactic acid 3.4.  WBC 15.2.  SARS coronavirus 2 positive.  Chest x-ray showed mild interstitial pulmonary edema without evidence of pneumonia.  He received Lasix 20 mg IV x1.  A RUQ  ultrasound 7/20  identified a 1.2 cm gallstone, sludge ball measuring 3.2 cm without gallbladder wall thickening or pericholecystic fluid.  Blood cultures were positive for E. coli.  She is on Ceftriaxone IV.  An abdominal MRI/MRCP 7/21 identified a 5-6 mm CBD stone without intrahepatic or extrahepatic ductal dilatation and gallstones.  She was transferred to [facility] ED for further GI evaluation and ERCP.  Labs today showed downtrending LFTs.  AST 43.  ALT 194.  Alk phos 208.  Total bili 0.9.  WBC down to 6.8.  Hemoglobin 11.2 -> 10.5 -> 9.9.  MCV 102.3.  Platelet 237.  INR 1.2.  She reported having nausea and vomited partially digested food x 2 episodes a few days prior to the episode of falling at home on 7/20. No hematemesis. She has infrequent heartburn on Protonix 40mg  QD for many years. No chest pain or upper abdominal pain. She has left flank pain which started after she fell at home. She had SOB at [facility], resolved after she received oxygen  2L [place] and Lasix IV. She is passing a dark brown, not sure if stool is black at least once daily. She has chronic constipation and takes Miralax daily. No rectal bleeding. Never had a colonoscopy. Last dose of Eliquis was taken at 8am on 7/21. No NSAID use. Her daughter Patricia is her POA. No family at the bedside at this time.   Abdominal MRI MRCP with and without contrast 10/26/2020: Motion degraded images.  Cholelithiasis with a suspected 5-6 mm mid CBD stone. No intrahepatic or extrahepatic ductal dilatation. Common duct measures 7 mm. Consider ERCP as clinically warranted.  Cholelithiasis, without evidence of acute cholecystitis.  Suspected iron deposition in the liver and spleen.  RUQ sonogram 10/25/2020: Prominent gallstone and sludge ball within the gallbladder noted. Gallbladder wall thickness normal. No biliary distention.  Echo 05/28/2019: 1. Left ventricular ejection fraction, by estimation, is 60 to 65%. The left ventricle has normal function. The left ventricle has no regional wall motion abnormalities. There is mild left ventricular hypertrophy. Left ventricular diastolic parameters are consistent with Grade II diastolic dysfunction (pseudonormalization). 2. Right ventricular systolic function is normal. The right ventricular size is mildly enlarged. mildly increased right ventricular wall thickness. There is severely elevated pulmonary artery systolic pressure. 3. Left atrial size was mild to moderately dilated. 4. Right atrial size was mildly dilated. 5. Moderate mitral valve regurgitation. 6. Tricuspid valve regurgitation is moderate. 7. Aortic valve regurgitation is mild to moderate. 8. The inferior vena cava is normal in size with <50% respiratory variability, suggesting right atrial pressure of 8   mmHg   Past Medical History:  Diagnosis Date   Abdominal aortic atherosclerosis (HCC) 09/2015   by xray   Anxiety    BCC (basal cell carcinoma of skin) 05/2011   on nose    BCC (basal cell carcinoma of skin) 08/09/2020   upper chest   Breast lesion    a. diagnosed with complex sclerosing lesion with calcifications of the right breast in 08/2016   Chest pain    a. nuclear stress test 03/2012: normal; b. 09/2018 MV: EF >65%, no isch/scar.    DDD (degenerative disc disease), lumbosacral 12/06/1992   Gallstones 09/2015   incidentally by xray   GERD (gastroesophageal reflux disease) 05/1999   HERPES ZOSTER 04/13/2007   Qualifier: Diagnosis of  By: Bean FNP, Billie-Lynn Daniels    Hiatal hernia    History of shingles    ophthalmic, takes acyclovir daily   Hyperlipidemia 12/2003   Hypertension 1990   Mitral regurgitation    a. echo 03/2012: EF 60-65%, no RWMA, mild to mod AI/MR, mod TR, PASP 37 mmHg   Osteoarthritis 08/21/1989   Osteoporosis with fracture 11/1997   with compression fracture T12   Persistent atrial fibrillation (HCC) 03/16/2012   a. CHADS2VASc => 5 (HTN, age x 2, vascular disease, sex category)-->Eliquis 5 BID;  b. Recurrent AF in 06/2016;  c. 01/2017 s/p DCCV;  d. 02/2017 recurrent AFib->Amio added->DCCV; e. 03/2017 recurrent AFib, s/p reload of amio and DCCV 04/02/2017   POSTHERPETIC NEURALGIA 04/20/2007   Qualifier: Diagnosis of  By: Bean FNP, Billie-Lynn Daniels    Rheumatoid arthritis (HCC)    RLS (restless legs syndrome)     Past Surgical History:  Procedure Laterality Date   ABDOMINAL HYSTERECTOMY  Age 38 - 1967   S/P TAH   abdominal ultrasound  04/23/2004   Gallstones   BREAST EXCISIONAL BIOPSY Right 2018   neg/benign complex sclerosing lesion   BREAST LUMPECTOMY WITH RADIOACTIVE SEED LOCALIZATION Right 10/2016   breast lumpectomy with radioactive seed localization and margin assessment for complex sclerosing lesion ([place])   BREAST LUMPECTOMY WITH RADIOACTIVE SEED LOCALIZATION Right 11/05/2016   Procedure: RIGHT BREAST LUMPECTOMY WITH RADIOACTIVE SEED LOCALIZATION;  Surgeon: Ingram, [place], MD;  Location: [facility];  Service: General;  Laterality: Right;   CARDIAC CATHETERIZATION     CARDIOVERSION N/A 01/31/2017   Procedure: CARDIOVERSION;  Surgeon: Arida, Muhammad A, MD;  Location: [facility];  Service: Cardiovascular;  Laterality: N/A;   CARDIOVERSION N/A 03/03/2017   Procedure: CARDIOVERSION;  Surgeon: Arida, Muhammad A, MD;  Location: [facility];  Service: Cardiovascular;  Laterality: N/A;   CARDIOVERSION N/A 04/02/2017   Procedure: CARDIOVERSION;  Surgeon: Arida, Muhammad A, MD;  Location: [facility];  Service: Cardiovascular;  Laterality: N/A;   CARDIOVERSION N/A 01/16/2018   Procedure: CARDIOVERSION;  Surgeon: Arida, Muhammad A, MD;  Location: [facility];  Service: Cardiovascular;  Laterality: N/A;   CATARACT EXTRACTION  05/2010   left eye   CERVICAL DISCECTOMY  1992   Fusion (Dr. Botero)   ESI Bilateral 01/2016   S1 transforaminal ESI x3   ESOPHAGOGASTRODUODENOSCOPY  01/01/2002   with ulcer, bx. neg; + stricture gastric ulcer with hemorrhage   ESOPHAGOGASTRODUODENOSCOPY  04/23/2004   Esophageal stricture -- dilated   INTRAVASCULAR PRESSURE WIRE/FFR STUDY N/A 07/12/2019   Procedure: INTRAVASCULAR PRESSURE WIRE/FFR STUDY;  Surgeon: Arida, Muhammad A, MD;  Location: [facility];  Service: Cardiovascular;  Laterality: N/A;   LOWER EXTREMITY ANGIOGRAPHY Left 02/01/2019   LOWER EXTREMITY ANGIOGRAPHY;    Surgeon: Dew, Jason S, MD   LOWER EXTREMITY ANGIOGRAPHY Right 03/25/2019   Procedure: LOWER EXTREMITY ANGIOGRAPHY;  Surgeon: Dew, Jason S, MD;  Location: [facility];  Service: Cardiovascular;  Laterality: Right;   LOWER EXTREMITY ANGIOGRAPHY Right 08/02/2019   Procedure: LOWER EXTREMITY ANGIOGRAPHY;  Surgeon: Dew, Jason S, MD;  Location: [facility];  Service: Cardiovascular;  Laterality: Right;   nuclear stress test  03/2012   no ischemia   PERIPHERAL VASCULAR BALLOON ANGIOPLASTY Left 01/2019   Percutaneous transluminal angioplasty of left anterior and posterior tibial  artery with 2.5 mm diameter by 22 cm length angioplasty balloon (Dew)   RIGHT/LEFT HEART CATH AND CORONARY ANGIOGRAPHY Bilateral 07/12/2019   Procedure: RIGHT/LEFT HEART CATH AND CORONARY ANGIOGRAPHY;  Surgeon: Arida, Muhammad A, MD;  Location: [facility];  Service: Cardiovascular;  Laterality: Bilateral;   SKIN CANCER EXCISION  05/20/2011   BCC from nose   US ECHOCARDIOGRAPHY  03/2012   in flutter, EF 60%, mild-mod aortic, mitral, tricuspid regurg, mildly dilated LA    Prior to Admission medications   Medication Sig Start Date End Date Taking? Authorizing Provider  acetaminophen (TYLENOL) 500 MG tablet Take 500 mg by mouth every 6 (six) hours as needed for mild pain.   Yes [provider]  acyclovir (ZOVIRAX) 400 MG tablet Take 400 mg by mouth 2 (two) times daily.   Yes [provider]  Alpha-Lipoic Acid 600 MG TABS Take 1 tablet by mouth daily.   Yes [provider]  amiodarone (PACERONE) 100 MG tablet Take 50 mg by mouth daily.   Yes [provider]  apixaban (ELIQUIS) 2.5 MG TABS tablet Take 2.5 mg by mouth 2 (two) times daily.   Yes [provider]  aspirin EC 81 MG tablet Take 81 mg by mouth daily. Swallow whole.   Yes [provider]  atorvastatin (LIPITOR) 20 MG tablet Take 20 mg by mouth daily.   Yes [provider]  doxycycline (VIBRA-TABS) 100 MG tablet Take 1 tablet (100 mg total) by mouth 2 (two) times daily. 10/10/20  Yes Letvak, Richard I, MD  DULoxetine (CYMBALTA) 60 MG capsule TAKE 1 CAPSULE BY MOUTH ONCE DAILY Patient taking differently: Take 60 mg by mouth daily. 04/28/20  Yes Gutierrez, Javier, MD  gabapentin (NEURONTIN) 400 MG capsule Take 1 capsule (400 mg total) by mouth at bedtime. With second dose during day as needed Patient taking differently: Take 400 mg by mouth every evening. 08/18/20  Yes Gutierrez, Javier, MD  lisinopril (ZESTRIL) 10 MG tablet TAKE 1 TABLET BY MOUTH ONCE DAILY Patient taking  differently: Take 10 mg by mouth daily. 10/14/20  Yes Gutierrez, Javier, MD  memantine (NAMENDA) 5 MG tablet TAKE 1 TABLET BY MOUTH TWICE DAILY Patient taking differently: Take 5 mg by mouth 2 (two) times daily. 04/07/20  Yes Gutierrez, Javier, MD  metoprolol tartrate (LOPRESSOR) 25 MG tablet TAKE 1/2 TABLET BY MOUTH TWICE DAILY Patient taking differently: Take 12.5 mg by mouth 2 (two) times daily. 01/13/20  Yes Arida, Muhammad A, MD  pantoprazole (PROTONIX) 40 MG tablet TAKE 1 TABLET BY MOUTH EVERY DAY Patient taking differently: Take 40 mg by mouth daily. 04/27/20  Yes Gutierrez, Javier, MD  polyethylene glycol (MIRALAX / GLYCOLAX) 17 g packet Take 17 g by mouth daily as needed for mild constipation. 10/27/19  Yes Amery, Sahar, MD  prednisoLONE acetate (PRED FORTE) 1 % ophthalmic suspension Place 1 drop into the left eye daily.    Yes   [provider]  rOPINIRole (REQUIP) 1 MG tablet Take 2.5 tablets (2.5 mg total) by mouth at bedtime. 03/30/20  Yes Ria Bush, MD  sennosides-docusate sodium (SENOKOT-S) 8.6-50 MG tablet Take 1 tablet by mouth daily.   Yes [provider]  silver sulfADIAZINE (SILVADENE) 1 % cream Apply 1 application topically daily. Patient taking differently: Apply 1 application topically daily. Apply to Elbow 10/10/20  Yes Venia Carbon, MD  tiZANidine (ZANAFLEX) 2 MG tablet Take 2 mg by mouth daily.   Yes [provider]  Vitamin D, Ergocalciferol, (DRISDOL) 1.25 MG (50000 UNIT) CAPS capsule TAKE 1 CAPSULE BY MOUTH ONCE WEEKLY Patient taking differently: Take 50,000 Units by mouth every 7 (seven) days. 04/25/20  Yes Tower, Wynelle Fanny, MD  metroNIDAZOLE (FLAGYL) 500 MG/100ML Inject 100 mLs (500 mg total) into the vein every 8 (eight) hours. 10/27/20   Patrecia Pour, MD    Current Facility-Administered Medications  Medication Dose Route Frequency Provider Last Rate Last Admin   cefTRIAXone (ROCEPHIN) 2 g in sodium chloride 0.9 % 100 mL IVPB  2 g  Intravenous Q24H Gala Romney L, MD 200 mL/hr at 10/27/20 2142 2 g at 10/27/20 2142   heparin injection 5,000 Units  5,000 Units Subcutaneous Q8H Gala Romney L, MD   5,000 Units at 10/28/20 0456   lactated ringers infusion   Intravenous Continuous Elwyn Reach, MD 75 mL/hr at 10/28/20 0520 Infusion Verify at 10/28/20 0520   metroNIDAZOLE (FLAGYL) IVPB 500 mg  500 mg Intravenous Q8H Gala Romney L, MD 100 mL/hr at 10/28/20 0455 500 mg at 10/28/20 0455   morphine 2 MG/ML injection 2 mg  2 mg Intravenous Q2H PRN Elwyn Reach, MD   2 mg at 10/28/20 0503   ondansetron (ZOFRAN) tablet 4 mg  4 mg Oral Q6H PRN Elwyn Reach, MD       Or   ondansetron (ZOFRAN) injection 4 mg  4 mg Intravenous Q6H PRN Elwyn Reach, MD        Allergies as of 10/27/2020 - Review Complete 10/27/2020  Allergen Reaction Noted   Penicillins Rash and Other (See Comments) 11/24/2006    Family History  Problem Relation Age of Onset   Emphysema Mother    Heart disease Father        MI   Stroke Father        first at age 68.   Stroke Sister    Diabetes Sister    Hypertension Brother    Heart disease Brother        Heart Valve   Stroke Brother    Diabetes Brother    Cancer Brother        esophageal   Diabetes Brother    Diabetes Brother    Colon cancer Neg Hx     Social History   Socioeconomic History   Marital status: Widowed    Spouse name: Not on file   Number of children: 5   Years of education: Not on file   Highest education level: Not on file  Occupational History   Occupation: Retired - Occupational psychologist: RETIRED  Tobacco Use   Smoking status: Former    Packs/day: 0.25    Years: 1.00    Pack years: 0.25    Types: Cigarettes    Quit date: 04/08/1976    Years since quitting: 44.5   Smokeless tobacco: Never   Tobacco comments:    pt smokes occasionally "socially"  Vaping Use  Vaping Use: Never used  Substance and Sexual Activity   Alcohol use: No     Alcohol/week: 1.0 standard drink    Types: 1 Glasses of wine per week   Drug use: No   Sexual activity: Never  Other Topics Concern   Not on file  Social History Narrative   Lives with grandson who smokes, dog   From [place].  Widowed 01/1999; 1st marriage 25 years / 2nd marriage 13 years.   One son deceased 10/09.   Activity: active with dog   Diet: good water, fruits/vegetables daily   Social Determinants of Health   Financial Resource Strain: Not on file  Food Insecurity: Not on file  Transportation Needs: Not on file  Physical Activity: Not on file  Stress: Not on file  Social Connections: Not on file  Intimate Partner Violence: Not on file    Review of Systems: See HPI all other systems reviewed and are negative   Physical Exam: Vital signs in last 24 hours: Temp:  [97.9 F (36.6 C)-98.8 F (37.1 C)] 97.9 F (36.6 C) (07/23 0458) Pulse Rate:  [63-100] 100 (07/23 0458) Resp:  [16-20] 18 (07/23 0458) BP: (124-152)/(64-75) 124/75 (07/23 0458) SpO2:  [95 %-100 %] 95 % (07/23 0458) Last BM Date: 10/26/20 General:  Alert 87 year old female in NAD. Head:  Normocephalic and atraumatic. Eyes:  No scleral icterus. Conjunctiva pink. Ears:  Normal auditory acuity. Nose:  No deformity, discharge or lesions. Mouth: Upper and lower dentures intact. No ulcers or lesions.  Neck:  Supple. No lymphadenopathy or thyromegaly.  Lungs: Few crackles LLL, on oxygen 2L [place]. Heart:  Regular rhythm, no murmur.  Abdomen:  Soft, nondistended. Moderate tenderness left flank area without rebound or guarding. Rectal: Deferred. Musculoskeletal:  Symmetrical without gross deformities.  Pulses:  Normal pulses noted. Extremities:  Without clubbing or edema. Numerous patches of ecchymosis to upper and lower extremities.  Neurologic:  Alert and  oriented x4. No focal deficits.  Skin:  Intact without significant lesions or rashes. Psych:  Alert and cooperative. Normal mood and  affect.  Intake/Output from previous day: 07/22 0701 - 07/23 0700 In: 1083.8 [I.V.:783.8; IV Piggyback:300] Out: 500 [Urine:500] Intake/Output this shift: No intake/output data recorded.  Lab Results: Recent Labs    10/27/20 0751 10/27/20 1939 10/28/20 0135  WBC 12.2* 8.9 6.8  HGB 11.2* 10.5* 9.9*  HCT 35.0* 32.6* 30.8*  PLT 221 240 237   BMET Recent Labs    10/26/20 0038 10/27/20 1125 10/27/20 1939 10/28/20 0135  NA 142 138  --  136  K 4.0 4.6  --  4.0  CL 107 107  --  106  CO2 27 25  --  24  GLUCOSE 125* 107*  --  89  BUN 13 9  --  7*  CREATININE 0.72 0.74 0.82 0.74  CALCIUM 8.8* 7.7*  --  7.4*   LFT Recent Labs    10/28/20 0135  PROT 4.8*  ALBUMIN 2.3*  AST 43*  ALT 194*  ALKPHOS 208*  BILITOT 0.9   PT/INR Recent Labs    10/28/20 0135  LABPROT 14.9  INR 1.2   Hepatitis Panel No results for input(s): HEPBSAG, HCVAB, HEPAIGM, HEPBIGM in the last 72 hours.    Studies/Results: MR 3D Recon At Scanner  Result Date: 10/26/2020 CLINICAL DATA:  Jaundice, cholelithiasis EXAM: MRI ABDOMEN WITHOUT AND WITH CONTRAST (INCLUDING MRCP) TECHNIQUE: Multiplanar multisequence MR imaging of the abdomen was performed both before and after the administration of   intravenous contrast. Heavily T2-weighted images of the biliary and pancreatic ducts were obtained, and three-dimensional MRCP images were rendered by post processing. CONTRAST:  21m GADAVIST GADOBUTROL 1 MMOL/ML IV SOLN COMPARISON:  Right upper quadrant ultrasound dated 10/25/2020 FINDINGS: Motion degraded images. Lower chest: Lung bases are clear. Hepatobiliary: Signal loss in the liver on inphase imaging, suggesting iron deposition. No suspicious/enhancing hepatic lesions. Distended gallbladder with a 13 mm gallstone and gallbladder sludge (series 17/image 20). No associated inflammatory changes. No intrahepatic or extrahepatic ductal dilatation. Common duct measures 7 mm. Associated 5-6 mm mid CBD stone (series  3/image 18). Pancreas:  Within normal limits. Spleen: Signal loss in the spleen on inphase imaging, suggesting iron deposition. Adrenals/Urinary Tract:  Adrenal glands are within normal limits. Kidneys are within normal limits.  No hydronephrosis. Stomach/Bowel: Stomach is notable for a large hiatal hernia/inverted intrathoracic stomach. Visualized bowel is grossly unremarkable. Vascular/Lymphatic:  No evidence of abdominal aortic aneurysm. No suspicious abdominal lymphadenopathy. Other:  No abdominal ascites. Musculoskeletal: Degenerative changes of the lumbar spine. IMPRESSION: Motion degraded images. Cholelithiasis with a suspected 5-6 mm mid CBD stone. No intrahepatic or extrahepatic ductal dilatation. Common duct measures 7 mm. Consider ERCP as clinically warranted. Cholelithiasis, without evidence of acute cholecystitis. Suspected iron deposition in the liver and spleen. Electronically Signed   By: SJulian HyM.D.   On: 10/26/2020 15:59   DG Chest Port 1 View  Result Date: 10/27/2020 CLINICAL DATA:  Shortness of breath EXAM: PORTABLE CHEST 1 VIEW COMPARISON:  10/25/2020 FINDINGS: Mild interstitial pulmonary edema. No pleural effusion or pneumothorax. No focal consolidation. Normal cardiomediastinal contours. IMPRESSION: Mild interstitial pulmonary edema. Electronically Signed   By: KUlyses JarredM.D.   On: 10/27/2020 01:35   MR ABDOMEN MRCP W WO CONTAST  Result Date: 10/26/2020 CLINICAL DATA:  Jaundice, cholelithiasis EXAM: MRI ABDOMEN WITHOUT AND WITH CONTRAST (INCLUDING MRCP) TECHNIQUE: Multiplanar multisequence MR imaging of the abdomen was performed both before and after the administration of intravenous contrast. Heavily T2-weighted images of the biliary and pancreatic ducts were obtained, and three-dimensional MRCP images were rendered by post processing. CONTRAST:  548mGADAVIST GADOBUTROL 1 MMOL/ML IV SOLN COMPARISON:  Right upper quadrant ultrasound dated 10/25/2020 FINDINGS: Motion  degraded images. Lower chest: Lung bases are clear. Hepatobiliary: Signal loss in the liver on inphase imaging, suggesting iron deposition. No suspicious/enhancing hepatic lesions. Distended gallbladder with a 13 mm gallstone and gallbladder sludge (series 17/image 20). No associated inflammatory changes. No intrahepatic or extrahepatic ductal dilatation. Common duct measures 7 mm. Associated 5-6 mm mid CBD stone (series 3/image 18). Pancreas:  Within normal limits. Spleen: Signal loss in the spleen on inphase imaging, suggesting iron deposition. Adrenals/Urinary Tract:  Adrenal glands are within normal limits. Kidneys are within normal limits.  No hydronephrosis. Stomach/Bowel: Stomach is notable for a large hiatal hernia/inverted intrathoracic stomach. Visualized bowel is grossly unremarkable. Vascular/Lymphatic:  No evidence of abdominal aortic aneurysm. No suspicious abdominal lymphadenopathy. Other:  No abdominal ascites. Musculoskeletal: Degenerative changes of the lumbar spine. IMPRESSION: Motion degraded images. Cholelithiasis with a suspected 5-6 mm mid CBD stone. No intrahepatic or extrahepatic ductal dilatation. Common duct measures 7 mm. Consider ERCP as clinically warranted. Cholelithiasis, without evidence of acute cholecystitis. Suspected iron deposition in the liver and spleen. Electronically Signed   By: SrJulian Hy.D.   On: 10/26/2020 15:59    IMPRESSION/PLAN:  [age]ear old female with elevated LFTs, gallstones and choledocholithiasis with ascending cholangitis.  RUQ  ultrasound 7/20  identified a 1.2 cm gallstone, sludge ball  measuring 3.2 cm without gallbladder wall thickening or pericholecystic fluid. MRI/MRCP 7/21 identified a 5-6 mm CBD stone without intrahepatic or extrahepatic ductal dilatation and gallstones. Blood cultures positive for E. coli.  She is on Ceftriaxone and Flagyl IV.  Today WBC level 6.8.  Alk phos 208.  AST 43.  ALT 194.  Total bili 0.9. She is afebrile.  Hemodynamically stable.  -Clear liquid diet -ERCP timing to be verified by Dr. Havery Moros, will not be done today -IV fluids -Zofran 4 mg IV every 6 hours as needed -Pain management per the medical service -Continue to trend LFTs  History of atrial fibrillation. CHADS2VASc => 5.  Last dose of Eliquis was taken at 8 AM on 10/26/2020 -Continue to hold Eliquis  Macrocytic anemia. Hg 11.2 -> 10.5 -> 9.9. MCV 103.6. Reports stools dark brown, possibly black. No overt GI bleeding at this time.  -Repeat CBC in am with iron panel and B12 level -FOBT  Right foot osteomyelitis on Doxycycline  Acute respiratory failure/SOB. Covid 19 positive 09/2020 treated with Paxlovid. Admission chest xray showed mild pulmonary edema. SOB improved after she received Lasix. On Oxygen 2L [place].  History of numerous falls at home Follow up with PCP      Noralyn Pick  10/28/2020, 08:26AM

## 2020-10-28 NOTE — Progress Notes (Signed)
Tried calling ENDO to see if pt was on schedule for today to be able to notify daughter.

## 2020-10-28 NOTE — Progress Notes (Signed)
PROGRESS NOTE   [name]  TDD:[id number] DOB: 10/28/1929 DOA: 10/27/2020 PCP: Ria Bush, MD  Brief Narrative:  87 year old community dwelling white female lives alone Chronic right fifth toe infection on doxycycline has been on this and followed by wound care for about 18 months Peripheral vascular disease Chronic A. fib CHADS2 score >3 on Eliquis amiodarone CAD COVID 19 Rx Paxlovid 10/02/2020 Prior cholelithiasis Several recent falls 2 weeks prior and then on 7/20-found to have came to emergency room secondary bilious emesis early morning 7/20 at [facility] she was found on the floor by daughter who lives in [place] Work-up revealed transaminitis alk phos 300s lipase 25 AST/ALT 600/500 lactic acid 3 White count 15 She remained COVID-positive Imaging did not reveal any fractures Left foot x-ray negative, right foot x-ray suggestive of chronic osteomyelitis [place] ultrasound = gallstone sludge ball in gallbladder, MRI confirmed cystic duct dilatation 7 mm Eventually found to have E. coli bacteremia likely secondary to gallbladder pathology Rx ceftriaxone/Flagyl given concerns for cholangitis-General surgery at [facility] consulted in addition to GI and patient was transferred to [facility] for ERCP   Hospital-Problem based course Cholangitis gallstones Empiric ceftriaxone/Flagyl since 7/20 GI to discuss with family timing ERCP-clear liquid diet at this time-LR 75 cc/H Pain control morphine 2 mg every 2 as needed Left flank pain in the setting of several falls Multiple lower extremity bruises secondary to falls predominantly on right side Do not appreciate any step-off on exam-may have bruised her rib? Daughter relates more than 2 falls in 6 months we will get therapy to reevaluate--might need 24-hour supervision Dress wound as per prior Gabapentin nightly and Requip 2.5 at bedtime for nerve pain also tizanidine resumed 2 mg daily Recent coronavirus infection treated with Paxlovid Off  isolation precautions stable at this time desat screen Chronic A. fib CHADS2 score >3 INR is 1.2, if no planned procedure will need timing of heparin until then Resume Eliquis as per GI in terms of timing Continue amiodarone 50 daily, metoprolol 12.5 twice daily and monitor on telemetry CKD 3 AA HTN Continue lisinopril 10 mg Monitor lab trends Peripheral vascular disease, onychogryphosis Chronic osteomyelitis right foot with bunion repair and shallow wound over the base of the first metatarsal Outpatient wound care management by podiatrist wound care Resume on discharge chronic suppressive doxycycline and outpatient wound follow-up  DVT prophylaxis: Heparin GTT Code Status: Full Family Communication: Discussed at the bedside with family-discussed in conjunction briefly with Dr. Havery Moros of GI Disposition:  Status is: Inpatient  Remains inpatient appropriate because:Hemodynamically unstable, Unsafe d/c plan, IV treatments appropriate due to intensity of illness or inability to take PO, and Inpatient level of care appropriate due to severity of illness  Dispo: The patient is from: Home              Anticipated d/c is to: Home              Patient currently is not medically stable to d/c.   Difficult to place patient No       Consultants:  Gastroenterology  Procedures:   Antimicrobials:   Ceftriaxone Flagyl since 7/18   Subjective:  Feels fair No stool for several days-no fever no chills-significant back pain she takes multiple meds for these which we have resumed Abdomen is soft nontender She is coherent She is on oxygen for unclear reasons   Objective: Vitals:   10/28/20 0108 10/28/20 0458 10/28/20 0923 10/28/20 1225  BP: (!) 146/64 124/75 (!) 143/85 (!) 145/64  Pulse: 72 100 74 69  Resp: _0 Temp: 98 F (36.7 C) 97.9 F (36.6 C) 98.3 F (36.8 C) 98.1 F (36.7 C)  TempSrc: Oral Oral Oral Oral  SpO2: 96% 95% 98% 90%    Intake/Output Summary  (Last 24 hours) at 10/28/2020 1305 Last data filed at 10/28/2020 0932 Gross per 24 hour  Intake 1083.75 ml  Output 950 ml  Net 133.75 ml   There were no vitals filed for this visit.  Examination:  Frail Pleasant white female no distress bitemporal wasting Supraclavicular wasting S1-S2 no murmur seems to be in sinus Abdomen soft no rebound Does have some left flank tenderness above floating ribs on the left side where she fell no bruise No CVA tenderness Lower extremities have wounds on upper outer calf of the right side Shallow ulcer at forefoot and also bunion surgery on fifth lateral metatarsal heads Neurologically intact to power-sensory deferred  Data Reviewed: personally reviewed   CBC    Component Value Date/Time   WBC 6.8 10/28/2020 0135   RBC 3.01 (L) 10/28/2020 0135   HGB 9.9 (L) 10/28/2020 0135   HGB 12.9 07/28/2019 1536   HCT 30.8 (L) 10/28/2020 0135   HCT 39.2 07/28/2019 1536   PLT 237 10/28/2020 0135   PLT 281 07/28/2019 1536   MCV 102.3 (H) 10/28/2020 0135   MCV 97 07/28/2019 1536   MCH 32.9 10/28/2020 0135   MCHC 32.1 10/28/2020 0135   RDW 14.5 10/28/2020 0135   RDW 12.5 07/28/2019 1536   LYMPHSABS 0.3 (L) 10/25/2020 0720   LYMPHSABS 1.4 07/28/2019 1536   MONOABS 0.6 10/25/2020 0720   EOSABS 0.0 10/25/2020 0720   EOSABS 0.2 07/28/2019 1536   BASOSABS 0.1 10/25/2020 0720   BASOSABS 0.1 07/28/2019 1536   CMP Latest Ref Rng & Units 10/28/2020 10/27/2020 10/27/2020  Glucose 70 - 99 mg/dL 89 - 107(H)  BUN 8 - 23 mg/dL 7(L) - 9  Creatinine 0.44 - 1.00 mg/dL 0.74 0.82 0.74  Sodium 135 - 145 mmol/L 136 - 138  Potassium 3.5 - 5.1 mmol/L 4.0 - 4.6  Chloride 98 - 111 mmol/L 106 - 107  CO2 22 - 32 mmol/L 24 - 25  Calcium 8.9 - 10.3 mg/dL 7.4(L) - 7.7(L)  Total Protein 6.5 - 8.1 g/dL 4.8(L) - 5.7(L)  Total Bilirubin 0.3 - 1.2 mg/dL 0.9 - 1.1  Alkaline Phos 38 - 126 U/L 208(H) - 234(H)  AST 15 - 41 U/L 43(H) - 78(H)  ALT 0 - 44 U/L 194(H) - 254(H)      Radiology Studies: MR 3D Recon At Scanner  Result Date: 10/26/2020 CLINICAL DATA:  Jaundice, cholelithiasis EXAM: MRI ABDOMEN WITHOUT AND WITH CONTRAST (INCLUDING MRCP) TECHNIQUE: Multiplanar multisequence MR imaging of the abdomen was performed both before and after the administration of intravenous contrast. Heavily T2-weighted images of the biliary and pancreatic ducts were obtained, and three-dimensional MRCP images were rendered by post processing. CONTRAST:  109m GADAVIST GADOBUTROL 1 MMOL/ML IV SOLN COMPARISON:  Right upper quadrant ultrasound dated 10/25/2020 FINDINGS: Motion degraded images. Lower chest: Lung bases are clear. Hepatobiliary: Signal loss in the liver on inphase imaging, suggesting iron deposition. No suspicious/enhancing hepatic lesions. Distended gallbladder with a 13 mm gallstone and gallbladder sludge (series 17/image 20). No associated inflammatory changes. No intrahepatic or extrahepatic ductal dilatation. Common duct measures 7 mm. Associated 5-6 mm mid CBD stone (series 3/image 18). Pancreas:  Within normal limits. Spleen: Signal loss in the spleen on inphase imaging,  suggesting iron deposition. Adrenals/Urinary Tract:  Adrenal glands are within normal limits. Kidneys are within normal limits.  No hydronephrosis. Stomach/Bowel: Stomach is notable for a large hiatal hernia/inverted intrathoracic stomach. Visualized bowel is grossly unremarkable. Vascular/Lymphatic:  No evidence of abdominal aortic aneurysm. No suspicious abdominal lymphadenopathy. Other:  No abdominal ascites. Musculoskeletal: Degenerative changes of the lumbar spine. IMPRESSION: Motion degraded images. Cholelithiasis with a suspected 5-6 mm mid CBD stone. No intrahepatic or extrahepatic ductal dilatation. Common duct measures 7 mm. Consider ERCP as clinically warranted. Cholelithiasis, without evidence of acute cholecystitis. Suspected iron deposition in the liver and spleen. Electronically Signed   By:  Julian Hy M.D.   On: 10/26/2020 15:59   DG Chest Port 1 View  Result Date: 10/27/2020 CLINICAL DATA:  Shortness of breath EXAM: PORTABLE CHEST 1 VIEW COMPARISON:  10/25/2020 FINDINGS: Mild interstitial pulmonary edema. No pleural effusion or pneumothorax. No focal consolidation. Normal cardiomediastinal contours. IMPRESSION: Mild interstitial pulmonary edema. Electronically Signed   By: Ulyses Jarred M.D.   On: 10/27/2020 01:35   MR ABDOMEN MRCP W WO CONTAST  Result Date: 10/26/2020 CLINICAL DATA:  Jaundice, cholelithiasis EXAM: MRI ABDOMEN WITHOUT AND WITH CONTRAST (INCLUDING MRCP) TECHNIQUE: Multiplanar multisequence MR imaging of the abdomen was performed both before and after the administration of intravenous contrast. Heavily T2-weighted images of the biliary and pancreatic ducts were obtained, and three-dimensional MRCP images were rendered by post processing. CONTRAST:  20m GADAVIST GADOBUTROL 1 MMOL/ML IV SOLN COMPARISON:  Right upper quadrant ultrasound dated 10/25/2020 FINDINGS: Motion degraded images. Lower chest: Lung bases are clear. Hepatobiliary: Signal loss in the liver on inphase imaging, suggesting iron deposition. No suspicious/enhancing hepatic lesions. Distended gallbladder with a 13 mm gallstone and gallbladder sludge (series 17/image 20). No associated inflammatory changes. No intrahepatic or extrahepatic ductal dilatation. Common duct measures 7 mm. Associated 5-6 mm mid CBD stone (series 3/image 18). Pancreas:  Within normal limits. Spleen: Signal loss in the spleen on inphase imaging, suggesting iron deposition. Adrenals/Urinary Tract:  Adrenal glands are within normal limits. Kidneys are within normal limits.  No hydronephrosis. Stomach/Bowel: Stomach is notable for a large hiatal hernia/inverted intrathoracic stomach. Visualized bowel is grossly unremarkable. Vascular/Lymphatic:  No evidence of abdominal aortic aneurysm. No suspicious abdominal lymphadenopathy. Other:  No  abdominal ascites. Musculoskeletal: Degenerative changes of the lumbar spine. IMPRESSION: Motion degraded images. Cholelithiasis with a suspected 5-6 mm mid CBD stone. No intrahepatic or extrahepatic ductal dilatation. Common duct measures 7 mm. Consider ERCP as clinically warranted. Cholelithiasis, without evidence of acute cholecystitis. Suspected iron deposition in the liver and spleen. Electronically Signed   By: SJulian HyM.D.   On: 10/26/2020 15:59     Scheduled Meds:  amiodarone  50 mg Oral Daily   DULoxetine  60 mg Oral Daily   gabapentin  400 mg Oral QHS   lisinopril  10 mg Oral Daily   memantine  5 mg Oral BID   metoprolol tartrate  12.5 mg Oral BID   polyethylene glycol  17 g Oral Daily   rOPINIRole  2.5 mg Oral QHS   tiZANidine  2 mg Oral Daily   Continuous Infusions:  cefTRIAXone (ROCEPHIN) IVPB 2 gram/100 mL NS (Mini-Bag Plus) 2 g (10/27/20 2142)   lactated ringers 75 mL/hr at 10/28/20 0520   metroNIDAZOLE 500 mg (10/28/20 0455)     LOS: 1 day   Time spent: [place] MD [facility] To contact the attending provider between 7A-7P or the covering provider during after hours  7P-7A, please log into the web site [email or web address] and access using universal [place] password for that web site. If you do not have the password, please call the hospital operator.  10/28/2020, 1:05 PM

## 2020-10-28 NOTE — Progress Notes (Signed)
ANTICOAGULATION CONSULT NOTE - Initial Consult  Pharmacy Consult for IV heparin Indication: atrial fibrillation - PTA apixaban  Allergies  Allergen Reactions   Penicillins Rash and Other (See Comments)    Childhood Allergy Has patient had a PCN reaction causing immediate rash, facial/tongue/throat swelling, SOB or lightheadedness with hypotension: Yes Has patient had a PCN reaction causing severe rash involving mucus membranes or skin necrosis: Unknown Has patient had a PCN reaction that required hospitalization: No Has patient had a PCN reaction occurring within the last 10 years: No If all of the above answers are "NO", then may proceed with Cephalosporin use.     Patient Measurements: Heparin Dosing Weight: 59 kg  Vital Signs: Temp: 98.1 Payne (36.7 C) (07/23 1225) Temp Source: Oral (07/23 1225) BP: 145/64 (07/23 1225) Pulse Rate: 69 (07/23 1225)  Labs: Recent Labs    10/25/20 1554 10/26/20 0038 10/27/20 0751 10/27/20 1125 10/27/20 1939 10/28/20 0135  HGB  --    < > 11.2*  --  10.5* 9.9*  HCT  --    < > 35.0*  --  32.6* 30.8*  PLT  --    < > 221  --  240 237  LABPROT  --   --   --   --   --  14.9  INR  --   --   --   --   --  1.2  CREATININE  --    < >  --  0.74 0.82 0.74  CKTOTAL 74  --   --   --   --   --    < > = values in this interval not displayed.    Estimated Creatinine Clearance: 39.8 mL/min (by C-G formula based on SCr of 0.74 mg/dL).   Medical History: Past Medical History:  Diagnosis Date   Abdominal aortic atherosclerosis ([place]) 09/2015   by xray   Anxiety    BCC (basal cell carcinoma of skin) 05/2011   on nose   BCC (basal cell carcinoma of skin) 08/09/2020   upper chest   Breast lesion    a. diagnosed with complex sclerosing lesion with calcifications of the right breast in 08/2016   Chest pain    a. nuclear stress test 03/2012: normal; b. 09/2018 MV: EF >65%, no isch/scar.    DDD (degenerative disc disease), lumbosacral 12/06/1992    Gallstones 09/2015   incidentally by xray   GERD (gastroesophageal reflux disease) 05/1999   HERPES ZOSTER 04/13/2007   Qualifier: Diagnosis of  By: [name], [name]    Hiatal hernia    History of shingles    ophthalmic, takes acyclovir daily   Hyperlipidemia 12/2003   Hypertension 1990   Mitral regurgitation    a. echo 03/2012: EF 60-65%, no RWMA, mild to mod AI/MR, mod TR, PASP 37 mmHg   Osteoarthritis 08/21/1989   Osteoporosis with fracture 11/1997   with compression fracture T12   Persistent atrial fibrillation ([place]) 03/16/2012   a. CHADS2VASc => 5 (HTN, age x 2, vascular disease, sex category)-->Eliquis 5 BID;  b. Recurrent AF in 06/2016;  c. 01/2017 s/p DCCV;  d. 02/2017 recurrent AFib->Amio added->DCCV; e. 03/2017 recurrent AFib, s/p reload of amio and DCCV 04/02/2017   POSTHERPETIC NEURALGIA 04/20/2007   Qualifier: Diagnosis of  By: [name], Billie-Lynn Daniels    Rheumatoid arthritis (HCC)    RLS (restless legs syndrome)     Medications:  Scheduled:   amiodarone  100 mg Oral Daily   DULoxetine  60 mg Oral Daily   gabapentin  400 mg Oral QHS   lisinopril  10 mg Oral Daily   memantine  5 mg Oral BID   metoprolol tartrate  12.5 mg Oral BID   polyethylene glycol  17 g Oral Daily   rOPINIRole  2.5 mg Oral QHS   tiZANidine  2 mg Oral Daily   Infusions:   cefTRIAXone (ROCEPHIN) IVPB 2 gram/100 mL NS (Mini-Bag Plus) 2 g (10/27/20 2142)   heparin     lactated ringers 75 mL/hr at 10/28/20 0520   metroNIDAZOLE 500 mg (10/28/20 0455)    Assessment: 87 yo Payne transfer from [facility] presenting with choledocholithiasis and bacteremic with E.Coli. PTA apixaban, last dose 7/21. No heparin infusion noted during [facility] admission. Consulted to start heparin.  Goal of Therapy:  Heparin level 0.3-0.7 units/ml Monitor platelets by anticoagulation protocol: Yes   Plan:  Start heparin IV 850 units/hr - no bolus Obtain heparin level and aPTT 6-8 hours Daily CBC and heparin level    Thank you for allowing pharmacy to participate in this patient's care.  Levonne Spiller, PharmD PGY1 Acute Care Resident  10/28/2020,2:15 PM

## 2020-10-29 DIAGNOSIS — K8050 Calculus of bile duct without cholangitis or cholecystitis without obstruction: Secondary | ICD-10-CM | POA: Diagnosis not present

## 2020-10-29 DIAGNOSIS — R7881 Bacteremia: Secondary | ICD-10-CM | POA: Diagnosis not present

## 2020-10-29 LAB — CBC WITH DIFFERENTIAL/PLATELET
Abs Immature Granulocytes: 0.03 10*3/uL (ref 0.00–0.07)
Basophils Absolute: 0.1 10*3/uL (ref 0.0–0.1)
Basophils Relative: 1 %
Eosinophils Absolute: 0.1 10*3/uL (ref 0.0–0.5)
Eosinophils Relative: 3 %
HCT: 32.5 % — ABNORMAL LOW (ref 36.0–46.0)
Hemoglobin: 10.3 g/dL — ABNORMAL LOW (ref 12.0–15.0)
Immature Granulocytes: 1 %
Lymphocytes Relative: 17 %
Lymphs Abs: 0.9 10*3/uL (ref 0.7–4.0)
MCH: 33.3 pg (ref 26.0–34.0)
MCHC: 31.7 g/dL (ref 30.0–36.0)
MCV: 105.2 fL — ABNORMAL HIGH (ref 80.0–100.0)
Monocytes Absolute: 0.5 10*3/uL (ref 0.1–1.0)
Monocytes Relative: 10 %
Neutro Abs: 3.6 10*3/uL (ref 1.7–7.7)
Neutrophils Relative %: 68 %
Platelets: 226 10*3/uL (ref 150–400)
RBC: 3.09 MIL/uL — ABNORMAL LOW (ref 3.87–5.11)
RDW: 14.3 % (ref 11.5–15.5)
WBC: 5.1 10*3/uL (ref 4.0–10.5)
nRBC: 0 % (ref 0.0–0.2)

## 2020-10-29 LAB — HEPATIC FUNCTION PANEL
ALT: 130 U/L — ABNORMAL HIGH (ref 0–44)
AST: 22 U/L (ref 15–41)
Albumin: 2.3 g/dL — ABNORMAL LOW (ref 3.5–5.0)
Alkaline Phosphatase: 173 U/L — ABNORMAL HIGH (ref 38–126)
Bilirubin, Direct: 0.3 mg/dL — ABNORMAL HIGH (ref 0.0–0.2)
Indirect Bilirubin: 0.8 mg/dL (ref 0.3–0.9)
Total Bilirubin: 1.1 mg/dL (ref 0.3–1.2)
Total Protein: 4.8 g/dL — ABNORMAL LOW (ref 6.5–8.1)

## 2020-10-29 LAB — APTT
aPTT: 54 seconds — ABNORMAL HIGH (ref 24–36)
aPTT: 79 seconds — ABNORMAL HIGH (ref 24–36)
aPTT: 76 seconds — ABNORMAL HIGH (ref 24–36)

## 2020-10-29 LAB — HEPARIN LEVEL (UNFRACTIONATED): Heparin Unfractionated: 0.53 IU/mL (ref 0.30–0.70)

## 2020-10-29 MED ORDER — DICLOFENAC EPOLAMINE 1.3 % EX PTCH
1.0000 | MEDICATED_PATCH | Freq: Two times a day (BID) | CUTANEOUS | Status: DC
  Administered 2020-10-29 – 2020-11-01 (×5): 1 via TRANSDERMAL
  Filled 2020-10-29 (×10): qty 1

## 2020-10-29 MED ORDER — WHITE PETROLATUM EX OINT
TOPICAL_OINTMENT | CUTANEOUS | Status: AC
  Filled 2020-10-29: qty 28.35

## 2020-10-29 MED ORDER — BACITRACIN ZINC 500 UNIT/GM EX OINT
TOPICAL_OINTMENT | Freq: Two times a day (BID) | CUTANEOUS | Status: DC
  Administered 2020-10-29: 1 via TOPICAL
  Filled 2020-10-29 (×3): qty 28.35

## 2020-10-29 NOTE — H&P (View-Only) (Signed)
      Progress Note   Subjective  Patient doing well, denies abdominal pain right now. On clears. Was placed on heparin drip while Eliquis on hold.    Objective   Vital signs in last 24 hours: Temp:  [97.7 F (36.5 C)-98.2 F (36.8 C)] 97.8 F (36.6 C) (07/24 0817) Pulse Rate:  [63-97] 64 (07/24 0817) Resp:  [16-20] 16 (07/24 0817) BP: (138-170)/(73-84) 170/73 (07/24 0817) SpO2:  [95 %-99 %] 95 % (07/24 0817) Last BM Date: 10/26/20 General:    white female in NAD Abdomen:  Soft, nontender and nondistended.  Neurologic:  Alert and oriented,  grossly normal neurologically. Psych:  Cooperative. Normal mood and affect.  Intake/Output from previous day: 07/23 0701 - 07/24 0700 In: 1406.7 [P.O.:240; I.V.:866.7; IV Piggyback:300] Out: 1550 [Urine:1550] Intake/Output this shift: Total I/O In: 360 [P.O.:360] Out: 500 [Urine:500]  Lab Results: Recent Labs    10/27/20 1939 10/28/20 0135 10/29/20 0652  WBC 8.9 6.8 5.1  HGB 10.5* 9.9* 10.3*  HCT 32.6* 30.8* 32.5*  PLT 240 237 226   BMET Recent Labs    10/27/20 1125 10/27/20 1939 10/28/20 0135  NA 138  --  136  K 4.6  --  4.0  CL 107  --  106  CO2 25  --  24  GLUCOSE 107*  --  89  BUN 9  --  7*  CREATININE 0.74 0.82 0.74  CALCIUM 7.7*  --  7.4*   LFT Recent Labs    10/29/20 0652  PROT 4.8*  ALBUMIN 2.3*  AST 22  ALT 130*  ALKPHOS 173*  BILITOT 1.1  BILIDIR 0.3*  IBILI 0.8   PT/INR Recent Labs    10/28/20 0135  LABPROT 14.9  INR 1.2    Studies/Results: No results found.     Assessment / Plan:    87 y/o female with a history of AF on Eliquis, presenting with choledocholithiasis / cholangitis, bacteremic with E.coli. MRCP 7/21 shows choledocholithiasis with 5-104m CBD stone. [facility] could not do ERCP today. She has done quite well on antibiotics alone, WBC normal and LAEs coming down. Last dose of Eliquis on 7/21 AM.   I discussed  choledocholithiasis with her, and discussed recommendations for ERCP / cholecystectomy. I detailed what ERCP is, risks, and she wants to proceed. Discussed high risk for recurrence without definitive cholecystectomy. One option given her labs would be cholecystectomy with IOC, in case stone has passed, but she is quite hesitant to have her gallbladder out, I asked her to at least speak with surgery about it at some point and she will do so.   She is scheduled for ERCP with Dr. GLyndel Safetomorrow AM - 8-9 AM. I will discuss her case with him this evening. Heparin drip will need to be held 4-6 hours in advance. Would have surgical team seem her at some point to discuss possible cholecystectomy otherwise.  Call with questions.  SJolly Mango MD [facility]

## 2020-10-29 NOTE — Progress Notes (Signed)
ANTICOAGULATION CONSULT NOTE - Follow Up Consult  Pharmacy Consult for heparin Indication: atrial fibrillation  Labs: Recent Labs    10/27/20 0751 10/27/20 1125 10/27/20 1939 10/28/20 0135 10/29/20 0105  HGB 11.2*  --  10.5* 9.9*  --   HCT 35.0*  --  32.6* 30.8*  --   PLT 221  --  240 237  --   APTT  --   --   --   --  54*  LABPROT  --   --   --  14.9  --   INR  --   --   --  1.2  --   HEPARINUNFRC  --   --   --   --  0.53  CREATININE  --  0.74 0.82 0.74  --     Assessment: 87yo female subtherapeutic on heparin with initial dosing while Eliquis on hold; no gtt issues or signs of bleeding per RN.  Goal of Therapy:  aPTT 66-102 seconds   Plan:  Will increase heparin gtt by 2-3 units/kg/hr to 1000 units/hr and check PTT in 8 hours.    Wynona Neat, PharmD, BCPS  10/29/2020,1:55 AM

## 2020-10-29 NOTE — Progress Notes (Signed)
PROGRESS NOTE   [name]  VHQ:[id number] DOB: 1929/05/26 DOA: 10/27/2020 PCP: Ria Bush, MD  Brief Narrative:  87 year old community dwelling white female lives alone Chronic right fifth toe infection on doxycycline has been on this and followed by wound care for about 18 months Peripheral vascular disease Chronic A. fib CHADS2 score >3 on Eliquis amiodarone CAD COVID 19 Rx Paxlovid 10/02/2020 Prior cholelithiasis Several recent falls 2 weeks prior and then on 7/20-found to have came to emergency room secondary bilious emesis early morning 7/20 at [facility] she was found on the floor by daughter who lives in [place] Work-up revealed transaminitis alk phos 300s lipase 25 AST/ALT 600/500 lactic acid 3 White count 15 She remained COVID-positive Imaging did not reveal any fractures Left foot x-ray negative, right foot x-ray suggestive of chronic osteomyelitis [place] ultrasound = gallstone sludge ball in gallbladder, MRI confirmed cystic duct dilatation 7 mm Eventually found to have E. coli bacteremia likely secondary to gallbladder pathology Rx ceftriaxone/Flagyl given concerns for cholangitis-General surgery at [facility] consulted in addition to GI and patient was transferred to [facility] for ERCP  ERCP delayed because of lack of availability and will be performed in the next several days as per GI who is on board with the same   Hospital-Problem based course Cholangitis gallstones Empiric ceftriaxone/Flagyl since 7/20 Clear liquid diet, ERCP per GI-LR 75 cc/H Transaminitis seems to be on the downward trend and no leukocytosis present Pain control morphine 2 mg every 2 as needed-can scale back if not in severe pain Left flank pain in the setting of several falls Multiple lower extremity bruises secondary to falls predominantly on right side Do not appreciate any step-off on exam-may have bruised her rib?-Still having discomfort, add diclofenac patch If she is having recurrent pain  which is not relieved by this please get x-rays in the morning >2 falls in 6 months-therapy to reevaluate Dress wound as per prior Gabapentin nightly and Requip 2.5 at bedtime for nerve pain also tizanidine resumed 2 mg daily Recent coronavirus infection treated with Paxlovid Off isolation precautions stable at this time desat screen Chronic A. fib CHADS2 score >3 Heparin at this time holding Eliquis until procedures performed Continue amiodarone 50 daily, metoprolol 12.5 twice daily Telemetry benign and discontinued 7/24 CKD 3 AA HTN Continue lisinopril 10 mg Monitor lab trends periodically Peripheral vascular disease, onychogryphosis Chronic osteomyelitis right foot with bunion repair and shallow wound over the base of the first metatarsal Chronic arthritic pains Outpatient wound care management by podiatrist wound care Resume on discharge chronic suppressive doxycycline and outpatient wound follow-up  DVT prophylaxis: Heparin GTT Code Status: Full Family Communication: No family present this morning at the bedside Disposition:  Status is: Inpatient  Remains inpatient appropriate because:Hemodynamically unstable, Unsafe d/c plan, IV treatments appropriate due to intensity of illness or inability to take PO, and Inpatient level of care appropriate due to severity of illness  Dispo: The patient is from: Home              Anticipated d/c is to: Home              Patient currently is not medically stable to d/c.   Difficult to place patient No   Consultants:  Gastroenterology  Procedures:   Antimicrobials:   Ceftriaxone Flagyl since 7/18   Subjective:  Mainly aches and pains in hips and joints and back probably from sleeping in the bed Still complains of some left flank pain-we discussed using a pain  patch She is not on oxygen today and looks comfortable and is eating her clear liquid diet at the bedside she has no fever no chills no nausea no vomiting  today   Objective: Vitals:   10/28/20 2031 10/28/20 2154 10/29/20 0516 10/29/20 0817  BP: (!) 161/84 (!) 145/81 138/76 (!) 170/73  Pulse: 67 63 97 64  Resp: _0 Temp: 97.8 F (36.6 C) 98 F (36.7 C) 97.7 F (36.5 C) 97.8 F (36.6 C)  TempSrc: Axillary Axillary Oral Oral  SpO2: 98% 99% 98% 95%    Intake/Output Summary (Last 24 hours) at 10/29/2020 0854 Last data filed at 10/29/2020 0817 Gross per 24 hour  Intake 1406.65 ml  Output 1850 ml  Net -443.35 ml    There were no vitals filed for this visit.  Examination:  Frail pleasant younger than stated age 87 NCAT S1-S2 in sinus rhythm abdomen soft no rebound no guarding no epigastric tenderness cannot appreciate organomegaly slightly distended Chest clear no rales rhonchi did not examine foot wounds today  Data Reviewed: personally reviewed   CBC    Component Value Date/Time   WBC 5.1 10/29/2020 0652   RBC 3.09 (L) 10/29/2020 0652   HGB 10.3 (L) 10/29/2020 0652   HGB 12.9 07/28/2019 1536   HCT 32.5 (L) 10/29/2020 0652   HCT 39.2 07/28/2019 1536   PLT 226 10/29/2020 0652   PLT 281 07/28/2019 1536   MCV 105.2 (H) 10/29/2020 0652   MCV 97 07/28/2019 1536   MCH 33.3 10/29/2020 0652   MCHC 31.7 10/29/2020 0652   RDW 14.3 10/29/2020 0652   RDW 12.5 07/28/2019 1536   LYMPHSABS 0.9 10/29/2020 0652   LYMPHSABS 1.4 07/28/2019 1536   MONOABS 0.5 10/29/2020 0652   EOSABS 0.1 10/29/2020 0652   EOSABS 0.2 07/28/2019 1536   BASOSABS 0.1 10/29/2020 0652   BASOSABS 0.1 07/28/2019 1536   CMP Latest Ref Rng & Units 10/29/2020 10/28/2020 10/27/2020  Glucose 70 - 99 mg/dL - 89 -  BUN 8 - 23 mg/dL - 7(L) -  Creatinine 0.44 - 1.00 mg/dL - 0.74 0.82  Sodium 135 - 145 mmol/L - 136 -  Potassium 3.5 - 5.1 mmol/L - 4.0 -  Chloride 98 - 111 mmol/L - 106 -  CO2 22 - 32 mmol/L - 24 -  Calcium 8.9 - 10.3 mg/dL - 7.4(L) -  Total Protein 6.5 - 8.1 g/dL 4.8(L) 4.8(L) -  Total Bilirubin 0.3 - 1.2 mg/dL 1.1 0.9 -  Alkaline Phos  38 - 126 U/L 173(H) 208(H) -  AST 15 - 41 U/L 22 43(H) -  ALT 0 - 44 U/L 130(H) 194(H) -     Radiology Studies: No results found.   Scheduled Meds:  amiodarone  100 mg Oral Daily   bacitracin   Topical BID   diclofenac  1 patch Transdermal BID   DULoxetine  60 mg Oral Daily   gabapentin  400 mg Oral QHS   lisinopril  10 mg Oral Daily   memantine  5 mg Oral BID   metoprolol tartrate  12.5 mg Oral BID   polyethylene glycol  17 g Oral Daily   rOPINIRole  2.5 mg Oral QHS   tiZANidine  2 mg Oral Daily   Continuous Infusions:  cefTRIAXone (ROCEPHIN) IVPB 2 gram/100 mL NS (Mini-Bag Plus) 2 g (10/28/20 2214)   heparin 1,000 Units/hr (10/29/20 0205)   lactated ringers 75 mL/hr at 10/29/20 0500   metroNIDAZOLE 500 mg (10/29/20 0520)  LOS: 2 days   Time spent: [place], MD [facility] To contact the attending provider between 7A-7P or the covering provider during after hours 7P-7A, please log into the web site [email or web address] and access using universal  password for that web site. If you do not have the password, please call the hospital operator.  10/29/2020, 8:54 AM

## 2020-10-29 NOTE — Progress Notes (Addendum)
[place] for IV heparin Indication: atrial fibrillation - PTA apixaban  Allergies  Allergen Reactions   Penicillins Rash and Other (See Comments)    Childhood Allergy Has patient had a PCN reaction causing immediate rash, facial/tongue/throat swelling, SOB or lightheadedness with hypotension: Yes Has patient had a PCN reaction causing severe rash involving mucus membranes or skin necrosis: Unknown Has patient had a PCN reaction that required hospitalization: No Has patient had a PCN reaction occurring within the last 10 years: No If all of the above answers are "NO", then may proceed with Cephalosporin use.     Patient Measurements: Heparin Dosing Weight: 59 kg  Vital Signs: Temp: 97.8 F (36.6 C) (07/24 0817) Temp Source: Oral (07/24 0817) BP: 170/73 (07/24 0817) Pulse Rate: 64 (07/24 0817)  Labs: Recent Labs    10/27/20 1125 10/27/20 1939 10/28/20 0135 10/29/20 0105 10/29/20 0652 10/29/20 1113  HGB  --  10.5* 9.9*  --  10.3*  --   HCT  --  32.6* 30.8*  --  32.5*  --   PLT  --  240 237  --  226  --   APTT  --   --   --  54* 79* 76*  LABPROT  --   --  14.9  --   --   --   INR  --   --  1.2  --   --   --   HEPARINUNFRC  --   --   --  0.53  --   --   CREATININE 0.74 0.82 0.74  --   --   --      Estimated Creatinine Clearance: 39.8 mL/min (by C-G formula based on SCr of 0.74 mg/dL).   Medical History: Past Medical History:  Diagnosis Date   Abdominal aortic atherosclerosis ([place]) 09/2015   by xray   Anxiety    BCC (basal cell carcinoma of skin) 05/2011   on nose   BCC (basal cell carcinoma of skin) 08/09/2020   upper chest   Breast lesion    a. diagnosed with complex sclerosing lesion with calcifications of the right breast in 08/2016   Chest pain    a. nuclear stress test 03/2012: normal; b. 09/2018 MV: EF >65%, no isch/scar.    DDD (degenerative disc disease), lumbosacral 12/06/1992   Gallstones 09/2015   incidentally  by xray   GERD (gastroesophageal reflux disease) 05/1999   HERPES ZOSTER 04/13/2007   Qualifier: Diagnosis of  By: [name], [name]    Hiatal hernia    History of shingles    ophthalmic, takes acyclovir daily   Hyperlipidemia 12/2003   Hypertension 1990   Mitral regurgitation    a. echo 03/2012: EF 60-65%, no RWMA, mild to mod AI/MR, mod TR, PASP 37 mmHg   Osteoarthritis 08/21/1989   Osteoporosis with fracture 11/1997   with compression fracture T12   Persistent atrial fibrillation ([place]) 03/16/2012   a. CHADS2VASc => 5 (HTN, age x 2, vascular disease, sex category)-->Eliquis 5 BID;  b. Recurrent AF in 06/2016;  c. 01/2017 s/p DCCV;  d. 02/2017 recurrent AFib->Amio added->DCCV; e. 03/2017 recurrent AFib, s/p reload of amio and DCCV 04/02/2017   POSTHERPETIC NEURALGIA 04/20/2007   Qualifier: Diagnosis of  By: [name], Billie-Lynn Daniels    Rheumatoid arthritis ([place])    RLS (restless legs syndrome)     Medications:  Infusions:   cefTRIAXone (ROCEPHIN) IVPB 2 gram/100 mL NS (Mini-Bag Plus) 2 g (10/28/20  2214)   heparin 1,000 Units/hr (10/29/20 0205)   lactated ringers 75 mL/hr at 10/29/20 0500   metroNIDAZOLE 500 mg (10/29/20 0520)    Assessment: 87 yo f transfer from [facility] presenting with choledocholithiasis and bacteremic with E.Coli. PTA apixaban, last dose 7/21. No heparin infusion noted during [facility] admission. Consulted to start heparin.  aPTT 76 seconds, therapeutic   Goal of Therapy:  Heparin level 0.3-0.7 units/ml aPTT goal 66-102 seconds Monitor platelets by anticoagulation protocol: Yes   Plan:  Continue heparin IV 1000 units/hr  Daily CBC, aPTT and heparin level   Thank you for allowing pharmacy to participate in this patient's care.  Levonne Spiller, PharmD PGY1 Acute Care Resident  10/29/2020,1:41 PM

## 2020-10-29 NOTE — Progress Notes (Signed)
      Progress Note   Subjective  Patient doing well, denies abdominal pain right now. On clears. Was placed on heparin drip while Eliquis on hold.    Objective   Vital signs in last 24 hours: Temp:  [97.7 F (36.5 C)-98.2 F (36.8 C)] 97.8 F (36.6 C) (07/24 0817) Pulse Rate:  [63-97] 64 (07/24 0817) Resp:  [16-20] 16 (07/24 0817) BP: (138-170)/(73-84) 170/73 (07/24 0817) SpO2:  [95 %-99 %] 95 % (07/24 0817) Last BM Date: 10/26/20 General:    white female in NAD Abdomen:  Soft, nontender and nondistended.  Neurologic:  Alert and oriented,  grossly normal neurologically. Psych:  Cooperative. Normal mood and affect.  Intake/Output from previous day: 07/23 0701 - 07/24 0700 In: 1406.7 [P.O.:240; I.V.:866.7; IV Piggyback:300] Out: 1550 [Urine:1550] Intake/Output this shift: Total I/O In: 360 [P.O.:360] Out: 500 [Urine:500]  Lab Results: Recent Labs    10/27/20 1939 10/28/20 0135 10/29/20 0652  WBC 8.9 6.8 5.1  HGB 10.5* 9.9* 10.3*  HCT 32.6* 30.8* 32.5*  PLT 240 237 226   BMET Recent Labs    10/27/20 1125 10/27/20 1939 10/28/20 0135  NA 138  --  136  K 4.6  --  4.0  CL 107  --  106  CO2 25  --  24  GLUCOSE 107*  --  89  BUN 9  --  7*  CREATININE 0.74 0.82 0.74  CALCIUM 7.7*  --  7.4*   LFT Recent Labs    10/29/20 0652  PROT 4.8*  ALBUMIN 2.3*  AST 22  ALT 130*  ALKPHOS 173*  BILITOT 1.1  BILIDIR 0.3*  IBILI 0.8   PT/INR Recent Labs    10/28/20 0135  LABPROT 14.9  INR 1.2    Studies/Results: No results found.     Assessment / Plan:    87 y/o female with a history of AF on Eliquis, presenting with choledocholithiasis / cholangitis, bacteremic with E.coli. MRCP 7/21 shows choledocholithiasis with 5-105m CBD stone. [facility] could not do ERCP today. She has done quite well on antibiotics alone, WBC normal and LAEs coming down. Last dose of Eliquis on 7/21 AM.   I discussed  choledocholithiasis with her, and discussed recommendations for ERCP / cholecystectomy. I detailed what ERCP is, risks, and she wants to proceed. Discussed high risk for recurrence without definitive cholecystectomy. One option given her labs would be cholecystectomy with IOC, in case stone has passed, but she is quite hesitant to have her gallbladder out, I asked her to at least speak with surgery about it at some point and she will do so.   She is scheduled for ERCP with Dr. GLyndel Safetomorrow AM - 8-9 AM. I will discuss her case with him this evening. Heparin drip will need to be held 4-6 hours in advance. Would have surgical team seem her at some point to discuss possible cholecystectomy otherwise.  Call with questions.  SJolly Mango MD [facility]

## 2020-10-30 ENCOUNTER — Inpatient Hospital Stay (HOSPITAL_COMMUNITY): Payer: Medicare Other

## 2020-10-30 ENCOUNTER — Telehealth: Payer: Self-pay | Admitting: *Deleted

## 2020-10-30 ENCOUNTER — Encounter (HOSPITAL_COMMUNITY)
Admission: AD | Disposition: A | Discharge status: Home Health | Payer: Self-pay | Source: Other Acute Inpatient Hospital | Attending: Internal Medicine

## 2020-10-30 ENCOUNTER — Inpatient Hospital Stay (HOSPITAL_COMMUNITY): Payer: Medicare Other | Admitting: Certified Registered"

## 2020-10-30 ENCOUNTER — Encounter (HOSPITAL_COMMUNITY): Payer: Self-pay | Admitting: Internal Medicine

## 2020-10-30 HISTORY — PX: ERCP: SHX5425

## 2020-10-30 HISTORY — PX: SPHINCTEROTOMY: SHX5279

## 2020-10-30 HISTORY — PX: REMOVAL OF STONES: SHX5545

## 2020-10-30 LAB — CBC WITH DIFFERENTIAL/PLATELET
Abs Immature Granulocytes: 0.06 10*3/uL (ref 0.00–0.07)
Basophils Absolute: 0.1 10*3/uL (ref 0.0–0.1)
Basophils Relative: 1 %
Eosinophils Absolute: 0.2 10*3/uL (ref 0.0–0.5)
Eosinophils Relative: 2 %
HCT: 34.3 % — ABNORMAL LOW (ref 36.0–46.0)
Hemoglobin: 11 g/dL — ABNORMAL LOW (ref 12.0–15.0)
Immature Granulocytes: 1 %
Lymphocytes Relative: 13 %
Lymphs Abs: 0.9 10*3/uL (ref 0.7–4.0)
MCH: 32.7 pg (ref 26.0–34.0)
MCHC: 32.1 g/dL (ref 30.0–36.0)
MCV: 102.1 fL — ABNORMAL HIGH (ref 80.0–100.0)
Monocytes Absolute: 0.6 10*3/uL (ref 0.1–1.0)
Monocytes Relative: 9 %
Neutro Abs: 5.2 10*3/uL (ref 1.7–7.7)
Neutrophils Relative %: 74 %
Platelets: 238 10*3/uL (ref 150–400)
RBC: 3.36 MIL/uL — ABNORMAL LOW (ref 3.87–5.11)
RDW: 14.1 % (ref 11.5–15.5)
WBC: 6.9 10*3/uL (ref 4.0–10.5)
nRBC: 0 % (ref 0.0–0.2)

## 2020-10-30 LAB — COMPREHENSIVE METABOLIC PANEL
ALT: 119 U/L — ABNORMAL HIGH (ref 0–44)
AST: 23 U/L (ref 15–41)
Albumin: 2.6 g/dL — ABNORMAL LOW (ref 3.5–5.0)
Alkaline Phosphatase: 181 U/L — ABNORMAL HIGH (ref 38–126)
Anion gap: 8 (ref 5–15)
BUN: 5 mg/dL — ABNORMAL LOW (ref 8–23)
CO2: 22 mmol/L (ref 22–32)
Calcium: 8 mg/dL — ABNORMAL LOW (ref 8.9–10.3)
Chloride: 105 mmol/L (ref 98–111)
Creatinine, Ser: 0.66 mg/dL (ref 0.44–1.00)
GFR, Estimated: >60 mL/min (ref >60)
Glucose, Bld: 95 mg/dL (ref 70–99)
Potassium: 3.7 mmol/L (ref 3.5–5.1)
Sodium: 135 mmol/L (ref 135–145)
Total Bilirubin: 0.9 mg/dL (ref 0.3–1.2)
Total Protein: 5.4 g/dL — ABNORMAL LOW (ref 6.5–8.1)

## 2020-10-30 LAB — APTT: aPTT: 70 seconds — ABNORMAL HIGH (ref 24–36)

## 2020-10-30 LAB — HEPARIN LEVEL (UNFRACTIONATED): Heparin Unfractionated: 0.51 IU/mL (ref 0.30–0.70)

## 2020-10-30 SURGERY — ERCP, WITH INTERVENTION IF INDICATED
Anesthesia: General

## 2020-10-30 MED ORDER — MUSCLE RUB 10-15 % EX CREA
TOPICAL_CREAM | CUTANEOUS | Status: DC | PRN
  Administered 2020-10-30: 1 via TOPICAL
  Filled 2020-10-30: qty 85

## 2020-10-30 MED ORDER — SODIUM CHLORIDE 0.9 % IV SOLN
INTRAVENOUS | Status: DC | PRN
  Administered 2020-10-30: 45 mL

## 2020-10-30 MED ORDER — DEXAMETHASONE SODIUM PHOSPHATE 4 MG/ML IJ SOLN
INTRAMUSCULAR | Status: DC | PRN
  Administered 2020-10-30: 5 mg via INTRAVENOUS

## 2020-10-30 MED ORDER — CIPROFLOXACIN IN D5W 400 MG/200ML IV SOLN
INTRAVENOUS | Status: AC
  Filled 2020-10-30: qty 200

## 2020-10-30 MED ORDER — CIPROFLOXACIN IN D5W 400 MG/200ML IV SOLN
INTRAVENOUS | Status: DC | PRN
  Administered 2020-10-30: 400 mg via INTRAVENOUS

## 2020-10-30 MED ORDER — LACTATED RINGERS IV SOLN: Freq: Once | INTRAVENOUS | Status: AC

## 2020-10-30 MED ORDER — GLUCAGON HCL RDNA (DIAGNOSTIC) 1 MG IJ SOLR
INTRAMUSCULAR | Status: DC | PRN
  Administered 2020-10-30 (×2): .25 mg via INTRAVENOUS

## 2020-10-30 MED ORDER — LACTATED RINGERS IV SOLN: INTRAVENOUS | Status: DC | PRN

## 2020-10-30 MED ORDER — GLUCAGON HCL RDNA (DIAGNOSTIC) 1 MG IJ SOLR
INTRAMUSCULAR | Status: AC
  Filled 2020-10-30: qty 1

## 2020-10-30 MED ORDER — INDOMETHACIN 50 MG RE SUPP
RECTAL | Status: AC
  Filled 2020-10-30: qty 2

## 2020-10-30 MED ORDER — SUCCINYLCHOLINE CHLORIDE 200 MG/10ML IV SOSY
PREFILLED_SYRINGE | INTRAVENOUS | Status: DC | PRN
  Administered 2020-10-30: 110 mg via INTRAVENOUS

## 2020-10-30 MED ORDER — LIDOCAINE 2% (20 MG/ML) 5 ML SYRINGE
INTRAMUSCULAR | Status: DC | PRN
  Administered 2020-10-30: 60 mg via INTRAVENOUS

## 2020-10-30 MED ORDER — ONDANSETRON HCL 4 MG/2ML IJ SOLN
INTRAMUSCULAR | Status: DC | PRN
  Administered 2020-10-30: 4 mg via INTRAVENOUS

## 2020-10-30 MED ORDER — PROPOFOL 10 MG/ML IV BOLUS
INTRAVENOUS | Status: DC | PRN
  Administered 2020-10-30: 60 mg via INTRAVENOUS

## 2020-10-30 MED ORDER — VASOPRESSIN 20 UNIT/ML IV SOLN
INTRAVENOUS | Status: DC | PRN
  Administered 2020-10-30: 1 [IU] via INTRAVENOUS

## 2020-10-30 MED ORDER — HEPARIN (PORCINE) 25000 UT/250ML-% IV SOLN
1000.0000 [IU]/h | INTRAVENOUS | Status: DC
  Administered 2020-10-30: 1000 [IU]/h via INTRAVENOUS
  Filled 2020-10-30: qty 250

## 2020-10-30 MED ORDER — ESMOLOL HCL 100 MG/10ML IV SOLN
INTRAVENOUS | Status: DC | PRN
Start: 2020-10-30 — End: 2020-10-30
  Administered 2020-10-30: 30 mg via INTRAVENOUS

## 2020-10-30 NOTE — Progress Notes (Signed)
[place] for IV heparin Indication: atrial fibrillation - PTA apixaban  Labs: Recent Labs    10/27/20 1939 10/28/20 0135 10/28/20 0135 10/29/20 0105 10/29/20 0652 10/29/20 1113 10/30/20 0042  HGB 10.5* 9.9*  --   --  10.3*  --  11.0*  HCT 32.6* 30.8*  --   --  32.5*  --  34.3*  PLT 240 237  --   --  226  --  238  APTT  --   --    < > 54* 79* 76* 70*  LABPROT  --  14.9  --   --   --   --   --   INR  --  1.2  --   --   --   --   --   HEPARINUNFRC  --   --   --  0.53  --   --  0.51  CREATININE 0.82 0.74  --   --   --   --  0.66   < > = values in this interval not displayed.   Assessment: 87 yo f transfer from [facility] presenting with choledocholithiasis and bacteremic with E.Coli. PTA apixaban, last dose 7/21. No heparin infusion noted during [facility] admission. Consulted to start heparin.  aPTT 70 sec  Goal of Therapy:  Heparin level 0.3-0.7 units/ml aPTT goal 66-102 seconds Monitor platelets by anticoagulation protocol: Yes   Plan:  Continue heparin IV 1000 units/hr  Daily CBC, aPTT and heparin level   Thank you for allowing pharmacy to participate in this patient's care.  10/30/2020,2:17 AM

## 2020-10-30 NOTE — Progress Notes (Signed)
[facility] Progress Note  [name]    [id number]  DOA: 10/27/2020     Date of Service: the patient was seen and examined on 10/30/2020  Brief hospital course: Past medical history of chronic A. fib, CAD, recent COVID-19 6/22, cholelithiasis, chronic right fifth toe infection on doxycycline.  Presents with complaints of abdominal pain nausea and vomiting.  Found to have ascending cholangitis. GI consulted. Treated with ERCP and antibiotics. General surgery consulted.  Patient does not have any interest in surgery. Currently plan is to monitor for improvement in abdominal pain and diet tolerance.  Subjective: Seen after the procedure.  No nausea or vomiting.  No fever, chills.  No abdominal pain.  Passing gas.  Assessment and Plan: 1.  Ascending cholangitis Currently on ceftriaxone and Flagyl. GI consulted. Underwent ERCP. No CBD stone but some bile duct sludge.  Sphincterotomy performed. Resume heparin 8 hours. Continue clears. Check labs in the morning.  2.  Cholelithiasis General surgery consulted. Patient does not have any interest in surgery. Monitor outpatient.  3.  HTN Chronic A. fib Rate controlled with amiodarone and metoprolol. Blood pressure controlled as well.  Currently on lisinopril. On therapeutic anticoagulation.  Will resume 8 hours after ERCP.  4.  History of dementia No behavioral issues. On memantine.  Continue.  5.  Neuropathy Restless leg syndrome Continue gabapentin.  Continue Requip.  6.  PVD Chronic wound On chronic doxycycline.  Currently on hold. Resume at discharge. Follow-up with podiatry outpatient.  7.  CKD 3A Serum creatinine stable.  Monitor.  Scheduled Meds:  amiodarone  100 mg Oral Daily   bacitracin   Topical BID   diclofenac  1 patch Transdermal BID   DULoxetine  60 mg Oral Daily   gabapentin  400 mg Oral QHS   lisinopril  10 mg Oral Daily   memantine  5 mg Oral BID   metoprolol tartrate  12.5 mg  Oral BID   polyethylene glycol  17 g Oral Daily   rOPINIRole  2.5 mg Oral QHS   tiZANidine  2 mg Oral Daily   Continuous Infusions:  cefTRIAXone (ROCEPHIN) IVPB 2 gram/100 mL NS (Mini-Bag Plus) 2 g (10/29/20 2121)   heparin     lactated ringers 75 mL/hr at 10/30/20 1517   metroNIDAZOLE 500 mg (10/30/20 1500)   PRN Meds: morphine injection, ondansetron **OR** ondansetron (ZOFRAN) IV  There is no height or weight on file to calculate BMI.        DVT Prophylaxis: Therapeutic Anticoagulation with heparin       Advance goals of care discussion: Pt is DNR.  Family Communication: family was present at bedside, at the time of interview.  The pt provided permission to discuss medical plan with the family. Opportunity was given to ask question and all questions were answered satisfactorily.   Data Reviewed: I have personally reviewed and interpreted daily labs, tele strips, imaging. Electrolytes stable.  LFT improving.  AST normal.  Bilirubin normal.  WBC normal.  Hemoglobin stable.  Physical Exam:  General: Appear in mild distress, no Rash; Oral Mucosa Clear, moist. no Abnormal Neck Mass Or lumps, Conjunctiva normal  Cardiovascular: S1 and S2 Present, no Murmur, Respiratory: good respiratory effort, Bilateral Air entry present and CTA, no Crackles, no wheezes Abdomen: Bowel Sound present, Soft and no tenderness Extremities: no Pedal edema Neurology: alert and oriented to time, place, and person affect appropriate. no new focal deficit Gait not checked due to patient safety concerns  Vitals:  10/30/20 1013 10/30/20 1022 10/30/20 1050 10/30/20 1407  BP: (!) 188/66 (!) 186/60 (!) 170/65 (!) 100/52  Pulse: 70 72 66 (!) 53  Resp: '18 15 18 18  '$ Temp:   98.6 F (37 C) 98.5 F (36.9 C)  TempSrc:   Oral Oral  SpO2: 95% 94% 93% 93%    Disposition:  Status is: Inpatient  Remains inpatient appropriate because:IV treatments appropriate due to intensity of illness or inability to  take PO  Dispo: The patient is from: Home              Anticipated d/c is to: Home              Patient currently is not medically stable to d/c.   Difficult to place patient No  Time spent: 35 minutes. I reviewed all nursing notes, pharmacy notes, vitals, pertinent old records. I have discussed plan of care as described above with RN.  Author: Berle Mull, MD [facility] 10/30/2020 5:06 PM  To reach On-call, see care teams to locate the attending and reach out via [email or web address]. Between 7PM-7AM, please contact night-coverage If you still have difficulty reaching the attending provider, please page the [facility] (Director on Call) for [facility] on amion for assistance.

## 2020-10-30 NOTE — Transfer of Care (Signed)
Immediate Anesthesia Transfer of Care Note  [name]  Procedure(s) Performed: ENDOSCOPIC RETROGRADE CHOLANGIOPANCREATOGRAPHY (ERCP)  Patient Location: PACU  Anesthesia Type:General  Level of Consciousness: awake, alert  and oriented  Airway & Oxygen Therapy: Patient Spontanous Breathing and Patient connected to nasal cannula oxygen  Post-op Assessment: Report given to RN, Post -op Vital signs reviewed and stable and Patient moving all extremities X 4  Post vital signs: Reviewed and stable  Last Vitals:  Vitals Value Taken Time  BP 183/77 10/30/20 1007  Temp 36.4 C 10/30/20 1009  Pulse 70 10/30/20 1012  Resp 18 10/30/20 1012  SpO2 95 % 10/30/20 1012  Vitals shown include unvalidated device data.  Last Pain:  Vitals:   10/30/20 1007  TempSrc:   PainSc: 0-No pain      Patients Stated Pain Goal: 0 ([id number])  Complications: No notable events documented.

## 2020-10-30 NOTE — Telephone Encounter (Signed)
Patient's daughter [name] left a voicemail stating that she wanted to update Dr. Danise Mina regarding her mom. [name] stated that her mom had the CT procedure done this am and everything went well. [name] stated that they flushed the bile duct and put a stent in. Patient's daughter stated that her mom will not have surgery at this time. [name] stated that Dr. Danise Mina has been so good to her mom that she wanted to give him this information.

## 2020-10-30 NOTE — Progress Notes (Signed)
[facility] inforemed that patient converted from Afib to NSR. Will continue to monitor. Arthor Captain LPN

## 2020-10-30 NOTE — Op Note (Addendum)
[facility] Patient Name: [name] Procedure Date : 10/30/2020 MRN: [id number] Attending MD: Jackquline Denmark , MD Date of Birth: [birth year] CSN: [id number] Age: 87 Admit Type: Inpatient Procedure:                ERCP Indications:              Bile duct stone(s) om MRCP. Pt with ascending                            cholangitis. Providers:                Jackquline Denmark, MD, Baird Cancer, RN, Tyrone Apple, Technician Referring MD:              Medicines:                GA, Cipro, Indocin suppositories. Glucagon 0.2 mg. Complications:            No immediate complications. Estimated Blood Loss:     Estimated blood loss was minimal. Procedure:                Pre-Anesthesia Assessment:                           - Prior to the procedure, a History and Physical                            was performed, and patient medications and                            allergies were reviewed. The patient's tolerance of                            previous anesthesia was also reviewed. The risks                            and benefits of the procedure and the sedation                            options and risks were discussed with the patient.                            All questions were answered, and informed consent                            was obtained. Prior Anticoagulants: The patient has                            taken heparin, last dose was day of procedure. ASA                            Grade Assessment: III - A patient with severe  systemic disease. After reviewing the risks and                            benefits, the patient was deemed in satisfactory                            condition to undergo the procedure.                           After obtaining informed consent, the scope was                            passed under direct vision. Throughout the                            procedure, the patient's blood  pressure, pulse, and                            oxygen saturations were monitored continuously. The                            [id number]) Olympus Duodenoscope was                            introduced through the mouth, and used to inject                            contrast into and used to inject contrast into the                            bile duct. The ERCP was accomplished without                            difficulty. The patient tolerated the procedure                            well. Scope In: Scope Out: Findings:      The scout film was normal. The esophagus was successfully intubated       under direct vision. The scope was advanced to a normal major papilla in       the descending duodenum without detailed examination of the pharynx,       larynx and associated structures, and upper GI tract. The upper GI tract       was grossly normal.      The major papilla was located at the edge of large diverticulum.      The bile duct was deeply cannulated with the short-nosed traction       sphincterotome. Contrast was injected. I personally interpreted the bile       duct images. Ductal flow of contrast was adequate. Image quality was       adequate. Contrast extended to the entire biliary tree. The lower third       of the main bile duct contained filling defect(s) thought to be sludge       in a nondilated CBD. A large filling defect 1.5 cm  was visualized in the       neck of the gallbladder. The gallbladder did fill. The right and the       left hepatic ducts were normal.      A 6 mm biliary sphincterotomy was made with a monofilament traction       (standard) sphincterotome using ERBE electrocautery. There was no       post-sphincterotomy bleeding. The biliary tree was swept with a 10 mm       balloon starting at the bifurcation. Sludge was swept from the duct. No       definite well-formed stones. The bile was green in color. There was no       pus.      Pancreatic duct  was intentionally not cannulated. Impression:               - Choledocholithiasis s/p biliary sphincterotomy                            with balloon extraction (sludge).                           - Cholelithiasis.                           - Periampullary diverticulum. Moderate Sedation:      none Recommendation:           - Return patient to hospital ward for ongoing care.                           - Watch for pancreatitis, bleeding, perforation,                            and cholangitis. There is a slightly higher                            incidence of post sphincterotomy bleeding d/t                            periampullary diverticulum.                           - Recommend surgical consultation for consideration                            of laparoscopic cholecystectomy. Pt and patient's                            daughter would like to talk to surgery before                            deciding.                           - Resume heparin in 8 hrs.                           - Recheck CBC, CMP in AM.                           -  The findings and recommendations were discussed                            with the patient's family. Procedure Code(s):        --- Professional ---                           [phone], Endoscopic retrograde                            cholangiopancreatography (ERCP); with removal of                            calculi/debris from biliary/pancreatic duct(s)                           [id number], Endoscopic retrograde                            cholangiopancreatography (ERCP); with                            sphincterotomy/papillotomy                           [phone], Endoscopic catheterization of the biliary                            ductal system, radiological supervision and                            interpretation Diagnosis Code(s):        --- Professional ---                           K80.50, Calculus of bile duct without cholangitis                            or  cholecystitis without obstruction CPT copyright 2019 [employer]. All rights reserved. The codes documented in this report are preliminary and upon coder review may  be revised to meet current compliance requirements. Jackquline Denmark, MD 10/30/2020 9:57:51 AM This report has been signed electronically. Number of Addenda: 0

## 2020-10-30 NOTE — Anesthesia Postprocedure Evaluation (Signed)
Anesthesia Post Note  [name]  Procedure(s) Performed: ENDOSCOPIC RETROGRADE CHOLANGIOPANCREATOGRAPHY (ERCP) SPHINCTEROTOMY     Patient location during evaluation: Endoscopy Anesthesia Type: General Level of consciousness: awake and alert Pain management: pain level controlled Vital Signs Assessment: post-procedure vital signs reviewed and stable Respiratory status: spontaneous breathing, nonlabored ventilation, respiratory function stable and patient connected to nasal cannula oxygen Cardiovascular status: blood pressure returned to baseline and stable Postop Assessment: no apparent nausea or vomiting Anesthetic complications: no   No notable events documented.  Last Vitals:  Vitals:   10/30/20 1022 10/30/20 1050  BP: (!) 186/60 (!) 170/65  Pulse: 72 66  Resp: 15 18  Temp:  37 C  SpO2: 94% 93%    Last Pain:  Vitals:   10/30/20 1136  TempSrc:   PainSc: 0-No pain                 Nashali Ditmer S

## 2020-10-30 NOTE — Consult Note (Signed)
[name] 1929-10-12  AT:[id number].    Requesting MD: Dr. Berle Mull Chief Complaint/Reason for Consult: choledocholithiasis  HPI:  This is an 87 yo  female with a history of PVD, osteomyelitis of her R foot, significant neuropathy, frequent falls, a fib on eliquis,  recent COVID, CKD, HTN, who had 1 episode of nausea about 2 weeks ago and a low grade temp of 99.6.  This resolved.  About 5 days ago she had another episode of nausea, but no other symptoms.  She fell and EMS was called.  She was taken to [facility] where she was noted to have elevated LFTs.  She was worked up and found to have choledocholithiasis.  She denies every  having any abdominal pain or biliary symptoms.  Prior to this admission, she was eating well with no N/V/abdominal pain.  She was found to have e coli bacteremia.  She is currently on doxycycline for chronic osteomyelitis of her right foot.  She was transferred to [facility] for GI services for an ERCP which she underwent today with removal of sludge and a sphincterotomy.  We have been asked to see her for consideration of cholecystectomy.  ROS: ROS: Please see HPI, otherwise currently negative, but she is sleepy after anesthesia so difficult to obtain all systems thoroughly.  Family History  Problem Relation Age of Onset   Emphysema Mother    Heart disease Father        MI   Stroke Father        first at age 72.   Stroke Sister    Diabetes Sister    Hypertension Brother    Heart disease Brother        Heart Valve   Stroke Brother    Diabetes Brother    Cancer Brother        esophageal   Diabetes Brother    Diabetes Brother    Colon cancer Neg Hx     Past Medical History:  Diagnosis Date   Abdominal aortic atherosclerosis ([place]) 09/2015   by xray   Anxiety    BCC (basal cell carcinoma of skin) 05/2011   on nose   BCC (basal cell carcinoma of skin) 08/09/2020   upper chest   Breast lesion    a. diagnosed with complex sclerosing lesion with  calcifications of the right breast in 08/2016   Chest pain    a. nuclear stress test 03/2012: normal; b. 09/2018 MV: EF >65%, no isch/scar.    DDD (degenerative disc disease), lumbosacral 12/06/1992   Gallstones 09/2015   incidentally by xray   GERD (gastroesophageal reflux disease) 05/1999   HERPES ZOSTER 04/13/2007   Qualifier: Diagnosis of  By: [name], [name]    Hiatal hernia    History of shingles    ophthalmic, takes acyclovir daily   Hyperlipidemia 12/2003   Hypertension 1990   Mitral regurgitation    a. echo 03/2012: EF 60-65%, no RWMA, mild to mod AI/MR, mod TR, PASP 37 mmHg   Osteoarthritis 08/21/1989   Osteoporosis with fracture 11/1997   with compression fracture T12   Persistent atrial fibrillation ([place]) 03/16/2012   a. CHADS2VASc => 5 (HTN, age x 2, vascular disease, sex category)-->Eliquis 5 BID;  b. Recurrent AF in 06/2016;  c. 01/2017 s/p DCCV;  d. 02/2017 recurrent AFib->Amio added->DCCV; e. 03/2017 recurrent AFib, s/p reload of amio and DCCV 04/02/2017   POSTHERPETIC NEURALGIA 04/20/2007   Qualifier: Diagnosis of  By: [name], [name]  Rheumatoid arthritis (HCC)    RLS (restless legs syndrome)     Past Surgical History:  Procedure Laterality Date   ABDOMINAL HYSTERECTOMY  Age 7 - 64   S/P TAH   abdominal ultrasound  04/23/2004   Gallstones   BREAST EXCISIONAL BIOPSY Right 2018   neg/benign complex sclerosing lesion   BREAST LUMPECTOMY WITH RADIOACTIVE SEED LOCALIZATION Right 10/2016   breast lumpectomy with radioactive seed localization and margin assessment for complex sclerosing lesion ([place])   BREAST LUMPECTOMY WITH RADIOACTIVE SEED LOCALIZATION Right 11/05/2016   Procedure: RIGHT BREAST LUMPECTOMY WITH RADIOACTIVE SEED LOCALIZATION;  Surgeon: Fanny Skates, MD;  Location: [place];  Service: General;  Laterality: Right;   CARDIAC CATHETERIZATION     CARDIOVERSION N/A 01/31/2017   Procedure: CARDIOVERSION;   Surgeon: Wellington Hampshire, MD;  Location: [facility];  Service: Cardiovascular;  Laterality: N/A;   CARDIOVERSION N/A 03/03/2017   Procedure: CARDIOVERSION;  Surgeon: Wellington Hampshire, MD;  Location: [facility];  Service: Cardiovascular;  Laterality: N/A;   CARDIOVERSION N/A 04/02/2017   Procedure: CARDIOVERSION;  Surgeon: Wellington Hampshire, MD;  Location: [facility];  Service: Cardiovascular;  Laterality: N/A;   CARDIOVERSION N/A 01/16/2018   Procedure: CARDIOVERSION;  Surgeon: Wellington Hampshire, MD;  Location: [facility];  Service: Cardiovascular;  Laterality: N/A;   CATARACT EXTRACTION  05/2010   left eye   CERVICAL DISCECTOMY  1992   Fusion (Dr. Joya Salm)   ESI Bilateral 01/2016   S1 transforaminal ESI x3   ESOPHAGOGASTRODUODENOSCOPY  01/01/2002   with ulcer, bx. neg; + stricture gastric ulcer with hemorrhage   ESOPHAGOGASTRODUODENOSCOPY  04/23/2004   Esophageal stricture -- dilated   INTRAVASCULAR PRESSURE WIRE/FFR STUDY N/A 07/12/2019   Procedure: INTRAVASCULAR PRESSURE WIRE/FFR STUDY;  Surgeon: Wellington Hampshire, MD;  Location: [place] [facility];  Service: Cardiovascular;  Laterality: N/A;   LOWER EXTREMITY ANGIOGRAPHY Left 02/01/2019   LOWER EXTREMITY ANGIOGRAPHY;  Surgeon: Algernon Huxley, MD   LOWER EXTREMITY ANGIOGRAPHY Right 03/25/2019   Procedure: LOWER EXTREMITY ANGIOGRAPHY;  Surgeon: Algernon Huxley, MD;  Location: [place] [facility];  Service: Cardiovascular;  Laterality: Right;   LOWER EXTREMITY ANGIOGRAPHY Right 08/02/2019   Procedure: LOWER EXTREMITY ANGIOGRAPHY;  Surgeon: Algernon Huxley, MD;  Location: [place] [facility];  Service: Cardiovascular;  Laterality: Right;   nuclear stress test  03/2012   no ischemia   PERIPHERAL VASCULAR BALLOON ANGIOPLASTY Left 01/2019   Percutaneous transluminal angioplasty of left anterior and posterior tibial artery with 2.5 mm diameter by 22 cm length angioplasty balloon ([place])   RIGHT/LEFT HEART CATH AND CORONARY ANGIOGRAPHY Bilateral 07/12/2019    Procedure: RIGHT/LEFT HEART CATH AND CORONARY ANGIOGRAPHY;  Surgeon: Wellington Hampshire, MD;  Location: [place] [facility];  Service: Cardiovascular;  Laterality: Bilateral;   SKIN CANCER EXCISION  05/20/2011   BCC from nose   US ECHOCARDIOGRAPHY  03/2012   in flutter, EF 60%, mild-mod aortic, mitral, tricuspid regurg, mildly dilated LA    Social History:  reports that she quit smoking about 44 years ago. Her smoking use included cigarettes. She has a 0.25 pack-year smoking history. She has never used smokeless tobacco. She reports that she does not drink alcohol and does not use drugs.  Allergies:  Allergies  Allergen Reactions   Penicillins Rash and Other (See Comments)    Childhood Allergy Has patient had a PCN reaction causing immediate rash, facial/tongue/throat swelling, SOB or lightheadedness with hypotension: Yes Has patient had a PCN reaction  causing severe rash involving mucus membranes or skin necrosis: Unknown Has patient had a PCN reaction that required hospitalization: No Has patient had a PCN reaction occurring within the last 10 years: No If all of the above answers are "NO", then may proceed with Cephalosporin use.     Medications Prior to Admission  Medication Sig Dispense Refill   acetaminophen (TYLENOL) 500 MG tablet Take 500 mg by mouth every 6 (six) hours as needed for mild pain.     acyclovir (ZOVIRAX) 400 MG tablet Take 400 mg by mouth 2 (two) times daily.     Alpha-Lipoic Acid 600 MG TABS Take 1 tablet by mouth daily.     amiodarone (PACERONE) 100 MG tablet Take 100 mg by mouth daily.     apixaban (ELIQUIS) 2.5 MG TABS tablet Take 2.5 mg by mouth 2 (two) times daily.     aspirin EC 81 MG tablet Take 81 mg by mouth daily. Swallow whole.     atorvastatin (LIPITOR) 20 MG tablet Take 20 mg by mouth daily.     doxycycline (VIBRA-TABS) 100 MG tablet Take 1 tablet (100 mg total) by mouth 2 (two) times daily. 84 tablet 0   DULoxetine (CYMBALTA) 60 MG capsule TAKE 1  CAPSULE BY MOUTH ONCE DAILY (Patient taking differently: Take 60 mg by mouth daily.) 90 capsule 1   gabapentin (NEURONTIN) 400 MG capsule Take 1 capsule (400 mg total) by mouth at bedtime. With second dose during day as needed (Patient taking differently: Take 400 mg by mouth every evening.) 150 capsule 1   lisinopril (ZESTRIL) 10 MG tablet TAKE 1 TABLET BY MOUTH ONCE DAILY (Patient taking differently: Take 10 mg by mouth daily.) 30 tablet 6   memantine (NAMENDA) 5 MG tablet TAKE 1 TABLET BY MOUTH TWICE DAILY (Patient taking differently: Take 5 mg by mouth 2 (two) times daily.) 60 tablet 6   metoprolol tartrate (LOPRESSOR) 25 MG tablet TAKE 1/2 TABLET BY MOUTH TWICE DAILY (Patient taking differently: Take 12.5 mg by mouth 2 (two) times daily.) 90 tablet 3   pantoprazole (PROTONIX) 40 MG tablet TAKE 1 TABLET BY MOUTH EVERY DAY (Patient taking differently: Take 40 mg by mouth daily.) 90 tablet 1   polyethylene glycol (MIRALAX / GLYCOLAX) 17 g packet Take 17 g by mouth daily as needed for mild constipation. 14 each 0   prednisoLONE acetate (PRED FORTE) 1 % ophthalmic suspension Place 1 drop into the left eye daily.      rOPINIRole (REQUIP) 1 MG tablet Take 2.5 tablets (2.5 mg total) by mouth at bedtime. 225 tablet 1   sennosides-docusate sodium (SENOKOT-S) 8.6-50 MG tablet Take 1 tablet by mouth daily.     silver sulfADIAZINE (SILVADENE) 1 % cream Apply 1 application topically daily. (Patient taking differently: Apply 1 application topically daily. Apply to Elbow) 50 g 0   tiZANidine (ZANAFLEX) 2 MG tablet Take 2 mg by mouth daily.     Vitamin D, Ergocalciferol, (DRISDOL) 1.25 MG (50000 UNIT) CAPS capsule TAKE 1 CAPSULE BY MOUTH ONCE WEEKLY (Patient taking differently: Take 50,000 Units by mouth every 7 (seven) days.) 12 capsule 3   metroNIDAZOLE (FLAGYL) 500 MG/100ML Inject 100 mLs (500 mg total) into the vein every 8 (eight) hours.       Physical Exam: Blood pressure (!) 170/65, pulse 66,  temperature 98.6 F (37 C), temperature source Oral, resp. rate 18, SpO2 93 %. General: pleasant, WD, WN, elderly white female who is laying in bed in NAD HEENT: head  is normocephalic, atraumatic.  Sclera are noninjected.  PERRL.  Ears and nose without any masses or lesions.  Mouth is pink and moist Heart: regular, rate, and rhythm.  Normal s1,s2. No obvious murmurs, gallops, or rubs noted.  Palpable radial and pedal pulses bilaterally Lungs: CTAB, no wheezes, rhonchi, or rales noted.  Respiratory effort nonlabored Abd: soft, NT, ND, +BS, no masses, hernias, or organomegaly MS: all 4 extremities are symmetrical with no cyanosis, clubbing, or edema. Skin: warm and dry with no masses, lesions, or rashes.  She does have a skin tear to her right knee as well as a small abrasion to her anterior right shin.  Healing minimal wound noted to lateral aspect of 5th metatarsal. Neuro: Cranial nerves 2-12 grossly intact, sensation is normal throughout Psych: A&Ox3 with an appropriate affect.   Results for orders placed or performed during the hospital encounter of 10/27/20 (from the past 48 hour(s))  Heparin level (unfractionated)     Status: None   Collection Time: 10/29/20  1:05 AM  Result Value Ref Range   Heparin Unfractionated 0.53 0.30 - 0.70 IU/mL    Comment: (NOTE) The clinical reportable range upper limit is being lowered to >1.10 to align with the FDA approved guidance for the current laboratory assay.  If heparin results are below expected values, and patient dosage has  been confirmed, suggest follow up testing of antithrombin III levels. Performed at [facility], [place] [address], [place], [place] [address]   APTT     Status: Abnormal   Collection Time: 10/29/20  1:05 AM  Result Value Ref Range   aPTT 54 (H) 24 - 36 seconds    Comment:        IF BASELINE aPTT IS ELEVATED, SUGGEST PATIENT RISK ASSESSMENT BE USED TO DETERMINE APPROPRIATE ANTICOAGULANT THERAPY. Performed at  [facility], [place] [address] [place], [place] [address]   CBC with Differential/Platelet     Status: Abnormal   Collection Time: 10/29/20  6:52 AM  Result Value Ref Range   WBC 5.1 4.0 - 10.5 K/uL   RBC 3.09 (L) 3.87 - 5.11 MIL/uL   Hemoglobin 10.3 (L) 12.0 - 15.0 g/dL   HCT 32.5 (L) 36.0 - 46.0 %   MCV 105.2 (H) 80.0 - 100.0 fL   MCH 33.3 26.0 - 34.0 pg   MCHC 31.7 30.0 - 36.0 g/dL   RDW 14.3 11.5 - 15.5 %   Platelets 226 150 - 400 K/uL   nRBC 0.0 0.0 - 0.2 %   Neutrophils Relative % 68 %   Neutro Abs 3.6 1.7 - 7.7 K/uL   Lymphocytes Relative 17 %   Lymphs Abs 0.9 0.7 - 4.0 K/uL   Monocytes Relative 10 %   Monocytes Absolute 0.5 0.1 - 1.0 K/uL   Eosinophils Relative 3 %   Eosinophils Absolute 0.1 0.0 - 0.5 K/uL   Basophils Relative 1 %   Basophils Absolute 0.1 0.0 - 0.1 K/uL   Immature Granulocytes 1 %   Abs Immature Granulocytes 0.03 0.00 - 0.07 K/uL    Comment: Performed at [facility] [address], [place],  [address]  APTT     Status: Abnormal   Collection Time: 10/29/20  6:52 AM  Result Value Ref Range   aPTT 79 (H) 24 - 36 seconds    Comment:        IF BASELINE aPTT IS ELEVATED, SUGGEST PATIENT RISK ASSESSMENT BE USED TO DETERMINE APPROPRIATE ANTICOAGULANT THERAPY.  Performed at [facility], [place] [address], [place], [place] [address]   Hepatic function panel Once     Status: Abnormal   Collection Time: 10/29/20  6:52 AM  Result Value Ref Range   Total Protein 4.8 (L) 6.5 - 8.1 g/dL   Albumin 2.3 (L) 3.5 - 5.0 g/dL   AST 22 15 - 41 U/L   ALT 130 (H) 0 - 44 U/L   Alkaline Phosphatase 173 (H) 38 - 126 U/L   Total Bilirubin 1.1 0.3 - 1.2 mg/dL   Bilirubin, Direct 0.3 (H) 0.0 - 0.2 mg/dL   Indirect Bilirubin 0.8 0.3 - 0.9 mg/dL    Comment: Performed at [facility] [address], [place], [place] [address]  APTT     Status: Abnormal   Collection Time: 10/29/20 11:13 AM  Result Value Ref Range   aPTT 76 (H) 24 - 36 seconds     Comment:        IF BASELINE aPTT IS ELEVATED, SUGGEST PATIENT RISK ASSESSMENT BE USED TO DETERMINE APPROPRIATE ANTICOAGULANT THERAPY. Performed at [facility], [place] [address] [place], [place] [address]   Heparin level (unfractionated)     Status: None   Collection Time: 10/30/20 12:42 AM  Result Value Ref Range   Heparin Unfractionated 0.51 0.30 - 0.70 IU/mL    Comment: (NOTE) The clinical reportable range upper limit is being lowered to >1.10 to align with the FDA approved guidance for the current laboratory assay.  If heparin results are below expected values, and patient dosage has  been confirmed, suggest follow up testing of antithrombin III levels. Performed at [facility], [name] [address] [place], [place] [address]   CBC with Differential/Platelet     Status: Abnormal   Collection Time: 10/30/20 12:42 AM  Result Value Ref Range   WBC 6.9 4.0 - 10.5 K/uL   RBC 3.36 (L) 3.87 - 5.11 MIL/uL   Hemoglobin 11.0 (L) 12.0 - 15.0 g/dL   HCT 34.3 (L) 36.0 - 46.0 %   MCV 102.1 (H) 80.0 - 100.0 fL   MCH 32.7 26.0 - 34.0 pg   MCHC 32.1 30.0 - 36.0 g/dL   RDW 14.1 11.5 - 15.5 %   Platelets 238 150 - 400 K/uL   nRBC 0.0 0.0 - 0.2 %   Neutrophils Relative % 74 %   Neutro Abs 5.2 1.7 - 7.7 K/uL   Lymphocytes Relative 13 %   Lymphs Abs 0.9 0.7 - 4.0 K/uL   Monocytes Relative 9 %   Monocytes Absolute 0.6 0.1 - 1.0 K/uL   Eosinophils Relative 2 %   Eosinophils Absolute 0.2 0.0 - 0.5 K/uL   Basophils Relative 1 %   Basophils Absolute 0.1 0.0 - 0.1 K/uL   Immature Granulocytes 1 %   Abs Immature Granulocytes 0.06 0.00 - 0.07 K/uL    Comment: Performed at [facility] [address], [place], [place] [address]  APTT     Status: Abnormal   Collection Time: 10/30/20 12:42 AM  Result Value Ref Range   aPTT 70 (H) 24 - 36 seconds    Comment:        IF BASELINE aPTT IS ELEVATED, SUGGEST PATIENT RISK ASSESSMENT BE USED TO DETERMINE APPROPRIATE ANTICOAGULANT  THERAPY. Performed at [facility], [place] [address] [place], [place] [address]   Comprehensive metabolic panel     Status: Abnormal   Collection Time: 10/30/20 12:42 AM  Result Value Ref Range   Sodium 135 135 - 145 mmol/L   Potassium 3.7 3.5 - 5.1 mmol/L   Chloride 105 98 - 111 mmol/L   CO2 22 22 - 32 mmol/L   Glucose, Bld 95 70 - 99 mg/dL    Comment: Glucose reference range applies only to samples taken after fasting for at least 8 hours.   BUN 5 (L) 8 - 23 mg/dL   Creatinine, Ser 0.66 0.44 - 1.00 mg/dL   Calcium 8.0 (L) 8.9 - 10.3 mg/dL   Total Protein 5.4 (L) 6.5 - 8.1 g/dL   Albumin 2.6 (L) 3.5 - 5.0 g/dL   AST 23 15 - 41 U/L   ALT 119 (H) 0 - 44 U/L   Alkaline Phosphatase 181 (H) 38 - 126 U/L   Total Bilirubin 0.9 0.3 - 1.2 mg/dL   GFR, Estimated >60 >60 mL/min    Comment: (NOTE) Calculated using the CKD-EPI Creatinine Equation (2021)    Anion gap 8 5 - 15    Comment: Performed at [facility], [place] [address], [place], [place] [address]   DG ERCP BILIARY & PANCREATIC DUCTS  Result Date: 10/30/2020 CLINICAL DATA:  Choledocholithiasis EXAM: ERCP TECHNIQUE: Multiple spot images obtained with the fluoroscopic device and submitted for interpretation post-procedure. FLUOROSCOPY TIME:  Fluoroscopy Time:  1 minutes 23 seconds Radiation Exposure Index (if provided by the fluoroscopic device): 14.26 mGy Number of Acquired Spot Images: 0 COMPARISON:  MRCP 10/26/2020 FINDINGS: A total of 5 intraoperative spot images are submitted for review. The images demonstrate a flexible duodenal scope in the descending duodenum with wire cannulation of the common bile duct. The cystic duct is patent as there is contrast material within the gallbladder. IMPRESSION: Intraoperative saved images obtained during ERCP. These images were submitted for radiologic interpretation only. Please see the procedural report for the amount of contrast and the fluoroscopy time utilized. Electronically Signed    By: Jacqulynn Cadet M.D.   On: 10/30/2020 10:45      Assessment/Plan Choledocholithiasis, cholelithiasis The patient has undergone an ERCP.  During this she had sludge removed and a sphincterotomy.  Contrast filled the gallbladder to rule out cholecystitis.  The patient has never been symptomatic from her gallbladder before this somewhat incidental find.  She does not really want to have surgery to remove her gallbladder with everything else she has going on in her life and her age.  We discussed that it is reasonable, given she doesn't have cholecystitis and has had an ERCP with sphincterotomy, to defer lap chole as the likelihood she has any further issues is generally low.  We discussed that this is not zero, but given her age etc, she may never have another problem with this.  We discussed what surgery would look like and entail as well as the patient does not want to do this.  I think this is reasonable given the above.  I have put our contact information in her DC section so she can always contact us moving forward if she develops biliary symptoms or if she changes her mind.  Her daughter who is her HCPOA likes this plan as well also does not want to pursue surgery at this time.  We did also discuss the possibility that could develop acute cholecystitis moving forward as well as at that time the recommendations may change which they understand.  For now, her diet can be advanced as tolerates and we will sign off.  D/w primary service.  Henreitta Cea, [facility] 10/30/2020, 12:37 PM Please see Amion for pager number during day hours 7:00am-4:30pm or 7:00am -11:30am on weekends

## 2020-10-30 NOTE — Anesthesia Procedure Notes (Signed)
Procedure Name: Intubation Date/Time: 10/30/2020 9:15 AM Performed by: Reeves Dam, CRNA Pre-anesthesia Checklist: Patient identified, Patient being monitored, Timeout performed, Emergency Drugs available and Suction available Patient Re-evaluated:Patient Re-evaluated prior to induction Oxygen Delivery Method: Circle system utilized Preoxygenation: Pre-oxygenation with 100% oxygen Induction Type: IV induction Ventilation: Mask ventilation without difficulty Laryngoscope Size: 3 and Miller Grade View: Grade I Tube type: Oral Tube size: 7.5 mm Number of attempts: 1 Airway Equipment and Method: Stylet Placement Confirmation: ETT inserted through vocal cords under direct vision, positive ETCO2 and breath sounds checked- equal and bilateral Secured at: 21 cm Tube secured with: Tape Dental Injury: Teeth and Oropharynx as per pre-operative assessment

## 2020-10-30 NOTE — Interval H&P Note (Signed)
History and Physical Interval Note:  10/30/2020 8:27 AM  [name]  has presented today for surgery, with the diagnosis of choledocholithiasis.  The various methods of treatment have been discussed with the patient and family. After consideration of risks, benefits and other options for treatment, the patient has consented to  Procedure(s): ENDOSCOPIC RETROGRADE CHOLANGIOPANCREATOGRAPHY (ERCP) (N/A) as a surgical intervention.  The patient's history has been reviewed, patient examined, no change in status, stable for surgery.  I have reviewed the patient's chart and labs.  Questions were answered to the patient's satisfaction.     Jackquline Denmark

## 2020-10-30 NOTE — Anesthesia Preprocedure Evaluation (Signed)
Anesthesia Evaluation  Patient identified by MRN, date of birth, ID band Patient awake    Reviewed: Allergy & Precautions, H&P , NPO status , Patient's Chart, lab work & pertinent test results  Airway Mallampati: II   Neck ROM: full    Dental   Pulmonary former smoker,    breath sounds clear to auscultation       Cardiovascular hypertension, + Peripheral Vascular Disease and +CHF  + dysrhythmias Atrial Fibrillation + Valvular Problems/Murmurs AI and MR  Rhythm:regular Rate:Normal     Neuro/Psych PSYCHIATRIC DISORDERS Anxiety Depression  Neuromuscular disease    GI/Hepatic PUD, GERD  ,gallstones   Endo/Other    Renal/GU      Musculoskeletal  (+) Arthritis ,   Abdominal   Peds  Hematology   Anesthesia Other Findings   Reproductive/Obstetrics                             Anesthesia Physical Anesthesia Plan  ASA: 3  Anesthesia Plan: General   Post-op Pain Management:    Induction: Intravenous  PONV Risk Score and Plan: 3 and Ondansetron, Dexamethasone and Treatment may vary due to age or medical condition  Airway Management Planned: Oral ETT  Additional Equipment:   Intra-op Plan:   Post-operative Plan: Extubation in OR  Informed Consent: I have reviewed the patients History and Physical, chart, labs and discussed the procedure including the risks, benefits and alternatives for the proposed anesthesia with the patient or authorized representative who has indicated his/her understanding and acceptance.     Dental advisory given  Plan Discussed with: Anesthesiologist, Surgeon and CRNA  Anesthesia Plan Comments:         Anesthesia Quick Evaluation

## 2020-10-30 NOTE — Progress Notes (Signed)
ANTICOAGULATION CONSULT NOTE - Follow Up Consult  Pharmacy Consult for Heparin Indication: atrial fibrillation  Allergies  Allergen Reactions   Penicillins Rash and Other (See Comments)    Childhood Allergy Has patient had a PCN reaction causing immediate rash, facial/tongue/throat swelling, SOB or lightheadedness with hypotension: Yes Has patient had a PCN reaction causing severe rash involving mucus membranes or skin necrosis: Unknown Has patient had a PCN reaction that required hospitalization: No Has patient had a PCN reaction occurring within the last 10 years: No If all of the above answers are "NO", then may proceed with Cephalosporin use.     Patient Measurements:   Heparin Dosing Weight:    Vital Signs: Temp: 98.6 F (37 C) (07/25 1050) Temp Source: Oral (07/25 1050) BP: 170/65 (07/25 1050) Pulse Rate: 66 (07/25 1050)  Labs: Recent Labs    10/27/20 1939 10/28/20 0135 10/28/20 0135 10/29/20 0105 10/29/20 0652 10/29/20 1113 10/30/20 0042  HGB 10.5* 9.9*  --   --  10.3*  --  11.0*  HCT 32.6* 30.8*  --   --  32.5*  --  34.3*  PLT 240 237  --   --  226  --  238  APTT  --   --    < > 54* 79* 76* 70*  LABPROT  --  14.9  --   --   --   --   --   INR  --  1.2  --   --   --   --   --   HEPARINUNFRC  --   --   --  0.53  --   --  0.51  CREATININE 0.82 0.74  --   --   --   --  0.66   < > = values in this interval not displayed.    Estimated Creatinine Clearance: 39.8 mL/min (by C-G formula based on SCr of 0.66 mg/dL).  Assessment:  Anticoag: PTA Apixaban (Afib), LD 7/21, currently 2.'5mg'$  due to bloody surgery. May consider d/c on 2.'5mg'$  due to increased risk of falls? - aPTT 70, HL 0.51, Hgb 11 up. Plts WNL.   Goal of Therapy:  Heparin level 0.3-0.7 units/ml Monitor platelets by anticoagulation protocol: Yes   Plan:  post-ERCP in 8 hrs, resume IV heparin at 1000 units/hr Daily HL and CBC   Fouad Taul S. Alford Highland, PharmD, BCPS Clinical Staff  Pharmacist [email or web address] Alford Highland, [place] 10/30/2020,11:03 AM

## 2020-10-31 LAB — COMPREHENSIVE METABOLIC PANEL
ALT: 86 U/L — ABNORMAL HIGH (ref 0–44)
AST: 19 U/L (ref 15–41)
Albumin: 2.5 g/dL — ABNORMAL LOW (ref 3.5–5.0)
Alkaline Phosphatase: 161 U/L — ABNORMAL HIGH (ref 38–126)
Anion gap: 6 (ref 5–15)
BUN: 6 mg/dL — ABNORMAL LOW (ref 8–23)
CO2: 24 mmol/L (ref 22–32)
Calcium: 8 mg/dL — ABNORMAL LOW (ref 8.9–10.3)
Chloride: 106 mmol/L (ref 98–111)
Creatinine, Ser: 0.72 mg/dL (ref 0.44–1.00)
GFR, Estimated: >60 mL/min (ref >60)
Glucose, Bld: 131 mg/dL — ABNORMAL HIGH (ref 70–99)
Potassium: 4.1 mmol/L (ref 3.5–5.1)
Sodium: 136 mmol/L (ref 135–145)
Total Bilirubin: 0.6 mg/dL (ref 0.3–1.2)
Total Protein: 5.1 g/dL — ABNORMAL LOW (ref 6.5–8.1)

## 2020-10-31 LAB — CBC WITH DIFFERENTIAL/PLATELET
Abs Immature Granulocytes: 0.08 10*3/uL — ABNORMAL HIGH (ref 0.00–0.07)
Basophils Absolute: 0 10*3/uL (ref 0.0–0.1)
Basophils Relative: 0 %
Eosinophils Absolute: 0 10*3/uL (ref 0.0–0.5)
Eosinophils Relative: 0 %
HCT: 32.8 % — ABNORMAL LOW (ref 36.0–46.0)
Hemoglobin: 10.8 g/dL — ABNORMAL LOW (ref 12.0–15.0)
Immature Granulocytes: 1 %
Lymphocytes Relative: 9 %
Lymphs Abs: 0.7 10*3/uL (ref 0.7–4.0)
MCH: 33.4 pg (ref 26.0–34.0)
MCHC: 32.9 g/dL (ref 30.0–36.0)
MCV: 101.5 fL — ABNORMAL HIGH (ref 80.0–100.0)
Monocytes Absolute: 0.5 10*3/uL (ref 0.1–1.0)
Monocytes Relative: 6 %
Neutro Abs: 6.7 10*3/uL (ref 1.7–7.7)
Neutrophils Relative %: 84 %
Platelets: 287 10*3/uL (ref 150–400)
RBC: 3.23 MIL/uL — ABNORMAL LOW (ref 3.87–5.11)
RDW: 14.2 % (ref 11.5–15.5)
WBC: 7.9 10*3/uL (ref 4.0–10.5)
nRBC: 0.3 % — ABNORMAL HIGH (ref 0.0–0.2)

## 2020-10-31 LAB — CBC
HCT: 28.1 % — ABNORMAL LOW (ref 36.0–46.0)
HCT: 31.4 % — ABNORMAL LOW (ref 36.0–46.0)
Hemoglobin: 8.9 g/dL — ABNORMAL LOW (ref 12.0–15.0)
Hemoglobin: 10.3 g/dL — ABNORMAL LOW (ref 12.0–15.0)
MCH: 33 pg (ref 26.0–34.0)
MCH: 34.2 pg — ABNORMAL HIGH (ref 26.0–34.0)
MCHC: 31.7 g/dL (ref 30.0–36.0)
MCHC: 32.8 g/dL (ref 30.0–36.0)
MCV: 104.1 fL — ABNORMAL HIGH (ref 80.0–100.0)
MCV: 104.3 fL — ABNORMAL HIGH (ref 80.0–100.0)
Platelets: 239 10*3/uL (ref 150–400)
Platelets: 261 10*3/uL (ref 150–400)
RBC: 2.7 MIL/uL — ABNORMAL LOW (ref 3.87–5.11)
RBC: 3.01 MIL/uL — ABNORMAL LOW (ref 3.87–5.11)
RDW: 14.4 % (ref 11.5–15.5)
RDW: 14.5 % (ref 11.5–15.5)
WBC: 7.1 10*3/uL (ref 4.0–10.5)
WBC: 7.7 10*3/uL (ref 4.0–10.5)
nRBC: 0 % (ref 0.0–0.2)
nRBC: 0 % (ref 0.0–0.2)

## 2020-10-31 LAB — TYPE AND SCREEN
ABO/RH(D): O POS
Antibody Screen: NEGATIVE

## 2020-10-31 LAB — ABO/RH: ABO/RH(D): O POS

## 2020-10-31 LAB — LACTIC ACID, PLASMA
Lactic Acid, Venous: 1.7 mmol/L (ref 0.5–1.9)
Lactic Acid, Venous: 1.1 mmol/L (ref 0.5–1.9)

## 2020-10-31 LAB — HEPARIN LEVEL (UNFRACTIONATED): Heparin Unfractionated: 0.32 IU/mL (ref 0.30–0.70)

## 2020-10-31 MED ORDER — SODIUM CHLORIDE 0.9 % IV BOLUS
1000.0000 mL | Freq: Once | INTRAVENOUS | Status: AC
  Administered 2020-10-31: 1000 mL via INTRAVENOUS

## 2020-10-31 MED ORDER — LACTATED RINGERS IV SOLN: INTRAVENOUS | Status: DC

## 2020-10-31 MED ORDER — APIXABAN 2.5 MG PO TABS
2.5000 mg | ORAL_TABLET | Freq: Two times a day (BID) | ORAL | Status: DC
  Administered 2020-11-01: 2.5 mg via ORAL
  Filled 2020-10-31 (×2): qty 1

## 2020-10-31 MED ORDER — PHENOL 1.4 % MT LIQD: 1.0000 | OROMUCOSAL | Status: DC | PRN

## 2020-10-31 MED ORDER — MENTHOL 3 MG MT LOZG: 1.0000 | LOZENGE | OROMUCOSAL | Status: DC | PRN

## 2020-10-31 MED ORDER — APIXABAN 2.5 MG PO TABS: 2.5000 mg | ORAL_TABLET | Freq: Two times a day (BID) | ORAL | Status: DC

## 2020-10-31 NOTE — Plan of Care (Signed)
  Problem: Clinical Measurements: Goal: Ability to maintain clinical measurements within normal limits will improve Outcome: Progressing Goal: Will remain free from infection Outcome: Progressing Goal: Diagnostic test results will improve Outcome: Progressing Goal: Respiratory complications will improve Outcome: Progressing Goal: Cardiovascular complication will be avoided Outcome: Progressing   Problem: Nutrition: Goal: Adequate nutrition will be maintained Outcome: Progressing   Problem: Elimination: Goal: Will not experience complications related to bowel motility Outcome: Progressing   Problem: Pain Managment: Goal: General experience of comfort will improve Outcome: Progressing   Problem: Safety: Goal: Ability to remain free from injury will improve Outcome: Progressing   Problem: Skin Integrity: Goal: Risk for impaired skin integrity will decrease Outcome: Progressing

## 2020-10-31 NOTE — Progress Notes (Signed)
[facility] Progress Note  [name]    [id number]  DOA: 10/27/2020     Date of Service: the patient was seen and examined on 10/31/2020  Brief hospital course: Past medical history of chronic A. fib, CAD, recent COVID-19 6/22, cholelithiasis, chronic right fifth toe infection on doxycycline.  Presents with complaints of abdominal pain nausea and vomiting.  Found to have ascending cholangitis. GI consulted. Treated with ERCP and antibiotics. General surgery consulted.  Patient does not have any interest in surgery. Currently plan is to monitor for improvement in blood pressure  Subjective: No acute complaint in the morning. Overnight had some left-sided flank pain. Later in the day blood pressure dropped.  No dizziness or lightheadedness.  No chest pain no abdominal pain.  No nausea no vomiting no diarrhea.  Assessment and Plan: 1.  Ascending cholangitis E. coli bacteremia Currently on ceftriaxone and Flagyl.  We will discontinue Flagyl. GI consulted. Underwent ERCP. No CBD stone but some bile duct sludge.  Sphincterotomy performed. Tolerating clear liquid diet. Will advance to soft diet. No evidence of pancreatitis clinically as well as on labs.  2.  Cholelithiasis General surgery consulted. Patient does not have any interest in surgery. Monitor outpatient.  3.  HTN Chronic A. Fib Hypotension Rate controlled with amiodarone and metoprolol. Blood pressure was rather high in the morning. Later in the day after receiving lisinopril and Lopressor patient blood pressure dropped. So far no other etiology other than blood pressure medications themself being a problem. Hemoglobin stable. Holding antihypertensive regimen On therapeutic anticoagulation. Resume Eliquis tomorrow.  4.  Anemia. Likely dilutional in nature. Hemoglobin was 11 for last 2 checks. On repeat currently is 9. Wife no external bleeding secondary 5 patient is on therapeutic  anticoagulation. Will recheck in 4 hours and if continues to drop may require CT abdomen pelvis with contrast.  5.  Neuropathy Restless leg syndrome Continue gabapentin.  Continue Requip.  6.  PVD Chronic wound On chronic doxycycline.  Currently on hold. Resume at discharge. Follow-up with podiatry outpatient.  7.  CKD 3A Serum creatinine stable.  Monitor.  8. History of dementia No behavioral issues. On memantine.  Continue.  Scheduled Meds:  amiodarone  100 mg Oral Daily   [START ON 11/01/2020] apixaban  2.5 mg Oral BID   bacitracin   Topical BID   diclofenac  1 patch Transdermal BID   DULoxetine  60 mg Oral Daily   memantine  5 mg Oral BID   polyethylene glycol  17 g Oral Daily   rOPINIRole  2.5 mg Oral QHS   tiZANidine  2 mg Oral Daily   Continuous Infusions:  cefTRIAXone (ROCEPHIN) IVPB 2 gram/100 mL NS (Mini-Bag Plus) 2 g (10/30/20 2017)   lactated ringers     metroNIDAZOLE 500 mg (10/31/20 1346)   PRN Meds: menthol-cetylpyridinium, morphine injection, Muscle Rub, ondansetron **OR** ondansetron (ZOFRAN) IV, phenol  There is no height or weight on file to calculate BMI.        DVT Prophylaxis: Therapeutic Anticoagulation with heparin   apixaban (ELIQUIS) tablet 2.5 mg Start: 11/01/20 1000 apixaban (ELIQUIS) tablet 2.5 mg    Advance goals of care discussion: Pt is DNR.  Family Communication: no family was present at bedside, at the time of interview.   Data Reviewed: I have personally reviewed and interpreted daily labs, tele strips, imaging. Electrolytes stable.  Serum creatinine stable.  LFTs improving AST normal.  ALT 86.  Total bilirubin normal.  Hemoglobin was 10.8 early  in the morning.  On recheck it is 8.9.  Will monitor.  Physical Exam:  General: Appear in mild distress, no Rash; Oral Mucosa Clear, moist. no Abnormal Neck Mass Or lumps, Conjunctiva normal  Cardiovascular: S1 and S2 Present, no Murmur, Respiratory: good respiratory effort, Bilateral  Air entry present and CTA, no Crackles, no wheezes Abdomen: Bowel Sound present, Soft and no tenderness Extremities: no Pedal edema Neurology: alert and oriented to time, place, and person affect appropriate. no new focal deficit Gait not checked due to patient safety concerns  Vitals:   10/31/20 0759 10/31/20 1140 10/31/20 1229 10/31/20 1440  BP: (!) 166/75 (!) 82/42 (!) 85/49 (!) 122/59  Pulse: 76 (!) 54 68 (!) 53  Resp: '18 16 16 16  '$ Temp: 98.2 F (36.8 C) 98.1 F (36.7 C) (!) 97.5 F (36.4 C)   TempSrc: Oral Oral Oral   SpO2: 91% 98% 96% 100%    Disposition:  Status is: Inpatient  Remains inpatient appropriate because:IV treatments appropriate due to intensity of illness or inability to take PO  Dispo: The patient is from: Home              Anticipated d/c is to: Home              Patient currently is not medically stable to d/c.   Difficult to place patient No  Time spent: 35 minutes. I reviewed all nursing notes, pharmacy notes, vitals, pertinent old records. I have discussed plan of care as described above with RN.  Author: Berle Mull, MD [facility] 10/31/2020 4:53 PM  To reach On-call, see care teams to locate the attending and reach out via [email or web address]. Between 7PM-7AM, please contact night-coverage If you still have difficulty reaching the attending provider, please page the [facility] (Director on Call) for [facility] on amion for assistance.

## 2020-10-31 NOTE — Progress Notes (Signed)
PT Cancellation Note  Patient Details Name: [name] MRN: [id number] DOB: [birth year]   Cancelled Treatment:    Reason Eval/Treat Not Completed: Patient not medically ready. Patient's RN states her BP has been dropping and she will get bolus. Will try to re-attempt later or tomorrow as time allows.   [name] 10/31/2020, 1:23 PM

## 2020-10-31 NOTE — Care Management Important Message (Signed)
Important Message  Patient Details  Name: [name] MRN: [id number] Date of Birth: 1929/09/07   Medicare Important Message Given:  Yes     [name] 10/31/2020, 2:58 PM

## 2020-10-31 NOTE — Progress Notes (Signed)
Progress Note    ASSESSMENT AND PLAN:   Choledocholithiasis s/p biliary sphincterotomy with extraction of sludge. Cholelithiasis.  Patient and patient's daughter would like to hold off on laparoscopic cholecystectomy d/t age and comorbid conditions. Ascending cholangitis-resolved A. fib on Eliquis  Plan: -Advance to low-fat diet -Can resume Eliquis 48 hours from ERCP. -Trend LFTs as outpatient -Antibiotics duration 7 days -We will sign off for now -Due to large stone, almost in the neck of the gallbladder, she is at risk of having acute cholecystitis in future.     SUBJECTIVE   No abdominal pain No fever or chills. Doing very well. LFTs trending down   OBJECTIVE:     Vital signs in last 24 hours: Temp:  [97.5 F (36.4 C)-98.6 F (37 C)] 98.2 F (36.8 C) (07/26 0759) Pulse Rate:  [53-105] 76 (07/26 0759) Resp:  [15-18] 18 (07/26 0759) BP: (100-188)/(52-82) 166/75 (07/26 0759) SpO2:  [91 %-96 %] 91 % (07/26 0759) Last BM Date: 10/30/20 General:   Alert, NAD EENT:  Normal hearing, non icteric sclera, conjunctive pink.  Heart:  Regular rate and rhythm; no murmur.  No lower extremity edema   Pulm: Normal respiratory effort, lungs CTA bilaterally without wheezes or crackles. Abdomen:  Soft, nondistended, nontender.  Normal bowel sounds,.       Neurologic:  Alert and  oriented x4;  grossly normal neurologically. Psych:  Pleasant, cooperative.  Normal mood and affect.   Intake/Output from previous day: 07/25 0701 - 07/26 0700 In: 58 [P.O.:720; I.V.:250] Out: 600 [Urine:600] Intake/Output this shift: No intake/output data recorded.  Lab Results: Recent Labs    10/29/20 0652 10/30/20 0042 10/31/20 0255  WBC 5.1 6.9 7.9  HGB 10.3* 11.0* 10.8*  HCT 32.5* 34.3* 32.8*  PLT 226 238 287   BMET Recent Labs    10/30/20 0042 10/31/20 0255  NA 135 136  K 3.7 4.1  CL 105 106  CO2 22 24  GLUCOSE 95 131*  BUN 5* 6*  CREATININE 0.66 0.72  CALCIUM  8.0* 8.0*   LFT Recent Labs    10/29/20 0652 10/30/20 0042 10/31/20 0255  PROT 4.8*   < > 5.1*  ALBUMIN 2.3*   < > 2.5*  AST 22   < > 19  ALT 130*   < > 86*  ALKPHOS 173*   < > 161*  BILITOT 1.1   < > 0.6  BILIDIR 0.3*  --   --   IBILI 0.8  --   --    < > = values in this interval not displayed.   PT/INR No results for input(s): LABPROT, INR in the last 72 hours. Hepatitis Panel No results for input(s): HEPBSAG, HCVAB, HEPAIGM, HEPBIGM in the last 72 hours.  DG ERCP BILIARY & PANCREATIC DUCTS  Result Date: 10/30/2020 CLINICAL DATA:  Choledocholithiasis EXAM: ERCP TECHNIQUE: Multiple spot images obtained with the fluoroscopic device and submitted for interpretation post-procedure. FLUOROSCOPY TIME:  Fluoroscopy Time:  1 minutes 23 seconds Radiation Exposure Index (if provided by the fluoroscopic device): 14.26 mGy Number of Acquired Spot Images: 0 COMPARISON:  MRCP 10/26/2020 FINDINGS: A total of 5 intraoperative spot images are submitted for review. The images demonstrate a flexible duodenal scope in the descending duodenum with wire cannulation of the common bile duct. The cystic duct is patent as there is contrast material within the gallbladder. IMPRESSION: Intraoperative saved images obtained during ERCP. These images were submitted for radiologic interpretation only. Please see the procedural report for  the amount of contrast and the fluoroscopy time utilized. Electronically Signed   By: Jacqulynn Cadet M.D.   On: 10/30/2020 10:45     Principal Problem:   Choledocholithiasis Active Problems:   HLD (hyperlipidemia)   Essential hypertension   GERD   Pulmonary hypertension, unspecified (HCC)   CAD (coronary artery disease), native coronary artery   CKD (chronic kidney disease) stage 3, GFR 30-59 ml/min (HCC)   Chronic diastolic CHF (congestive heart failure) ([place])   DNR (do not resuscitate)   Osteomyelitis ([place])   COVID-19 virus infection   Bacteremia due to Gram-negative  bacteria     LOS: 4 days     Carmell Austria, MD 10/31/2020, 9:14 AM [facility] [phone]

## 2020-10-31 NOTE — Plan of Care (Signed)
  Problem: Education: Goal: Knowledge of General Education information will improve Description: Including pain rating scale, medication(s)/side effects and non-pharmacologic comfort measures Outcome: Progressing   Problem: Health Behavior/Discharge Planning: Goal: Ability to manage health-related needs will improve Outcome: Progressing   Problem: Clinical Measurements: Goal: Ability to maintain clinical measurements within normal limits will improve Outcome: Progressing Goal: Will remain free from infection Outcome: Progressing Goal: Diagnostic test results will improve Outcome: Progressing Goal: Respiratory complications will improve Outcome: Progressing Goal: Cardiovascular complication will be avoided Outcome: Progressing   Problem: Nutrition: Goal: Adequate nutrition will be maintained Outcome: Progressing   Problem: Activity: Goal: Risk for activity intolerance will decrease Outcome: Progressing   Problem: Coping: Goal: Level of anxiety will decrease Outcome: Progressing   Problem: Elimination: Goal: Will not experience complications related to bowel motility Outcome: Progressing Goal: Will not experience complications related to urinary retention Outcome: Progressing   Problem: Pain Managment: Goal: General experience of comfort will improve Outcome: Progressing   Problem: Safety: Goal: Ability to remain free from injury will improve Outcome: Progressing

## 2020-11-01 LAB — COMPREHENSIVE METABOLIC PANEL
ALT: 71 U/L — ABNORMAL HIGH (ref 0–44)
AST: 23 U/L (ref 15–41)
Albumin: 2.5 g/dL — ABNORMAL LOW (ref 3.5–5.0)
Alkaline Phosphatase: 151 U/L — ABNORMAL HIGH (ref 38–126)
Anion gap: 6 (ref 5–15)
BUN: 11 mg/dL (ref 8–23)
CO2: 25 mmol/L (ref 22–32)
Calcium: 8.3 mg/dL — ABNORMAL LOW (ref 8.9–10.3)
Chloride: 108 mmol/L (ref 98–111)
Creatinine, Ser: 0.96 mg/dL (ref 0.44–1.00)
GFR, Estimated: 57 mL/min — ABNORMAL LOW (ref >60)
Glucose, Bld: 125 mg/dL — ABNORMAL HIGH (ref 70–99)
Potassium: 3.3 mmol/L — ABNORMAL LOW (ref 3.5–5.1)
Sodium: 139 mmol/L (ref 135–145)
Total Bilirubin: 0.6 mg/dL (ref 0.3–1.2)
Total Protein: 4.9 g/dL — ABNORMAL LOW (ref 6.5–8.1)

## 2020-11-01 LAB — CBC
HCT: 32.4 % — ABNORMAL LOW (ref 36.0–46.0)
Hemoglobin: 10.4 g/dL — ABNORMAL LOW (ref 12.0–15.0)
MCH: 33 pg (ref 26.0–34.0)
MCHC: 32.1 g/dL (ref 30.0–36.0)
MCV: 102.9 fL — ABNORMAL HIGH (ref 80.0–100.0)
Platelets: 263 10*3/uL (ref 150–400)
RBC: 3.15 MIL/uL — ABNORMAL LOW (ref 3.87–5.11)
RDW: 14.5 % (ref 11.5–15.5)
WBC: 8 10*3/uL (ref 4.0–10.5)
nRBC: 0.3 % — ABNORMAL HIGH (ref 0.0–0.2)

## 2020-11-01 MED ORDER — METOPROLOL SUCCINATE ER 25 MG PO TB24
25.0000 mg | ORAL_TABLET | Freq: Every day | ORAL | Status: DC
  Administered 2020-11-01: 25 mg via ORAL
  Filled 2020-11-01: qty 1

## 2020-11-01 MED ORDER — METOPROLOL SUCCINATE ER 25 MG PO TB24: 25.0000 mg | ORAL_TABLET | Freq: Every day | ORAL | 0 refills | Status: DC

## 2020-11-01 MED ORDER — CEFPODOXIME PROXETIL 200 MG PO TABS: 200.0000 mg | ORAL_TABLET | Freq: Two times a day (BID) | ORAL | 0 refills | Status: AC

## 2020-11-01 MED ORDER — SACCHAROMYCES BOULARDII 250 MG PO CAPS: 250.0000 mg | ORAL_CAPSULE | Freq: Two times a day (BID) | ORAL | 0 refills | Status: AC

## 2020-11-01 MED ORDER — METHOCARBAMOL 500 MG PO TABS: 500.0000 mg | ORAL_TABLET | Freq: Three times a day (TID) | ORAL | Status: DC | PRN

## 2020-11-01 MED ORDER — MORPHINE SULFATE (PF) 2 MG/ML IV SOLN
1.0000 mg | Freq: Once | INTRAVENOUS | Status: AC
  Administered 2020-11-01: 1 mg via INTRAVENOUS
  Filled 2020-11-01: qty 1

## 2020-11-01 MED ORDER — POTASSIUM CHLORIDE CRYS ER 20 MEQ PO TBCR
40.0000 meq | EXTENDED_RELEASE_TABLET | Freq: Once | ORAL | Status: AC
  Administered 2020-11-01: 40 meq via ORAL
  Filled 2020-11-01: qty 2

## 2020-11-01 MED ORDER — ACETAMINOPHEN 325 MG PO TABS
650.0000 mg | ORAL_TABLET | Freq: Four times a day (QID) | ORAL | Status: DC | PRN
  Administered 2020-11-01: 650 mg via ORAL
  Filled 2020-11-01: qty 2

## 2020-11-01 NOTE — Telephone Encounter (Signed)
Noted. Thank you. Still hospitalized.

## 2020-11-01 NOTE — Consult Note (Signed)
   [facility] Inpatient Consult   11/01/2020  [name] September 25, 1929 [id number]  [place] Organization [ACO] Patient: [employer] Medicare  Primary Care Provider:  Ria Bush, MD, [place] at [facility], this is an Embedded provider that has a Chronic Care Management program [CCM]  Patient screened for showing as readmission hospitalization.  Review of patient's medical record reveals patient was transferred from [facility] to [facility] 10/27/20. Patient was discussed with inpatient TOC and she is for home with home health. Patient encounters reveal she has also been active with [facility] Palliative program.  Plan:  No additional needs for follow up assessed for Embedded CCM team at this time. This provider is listed for the Transition of Care for post hospital follow up.  For questions contact:   Natividad Brood, RN BSN [facility] Liaison  [phone] business mobile phone Toll free office [phone]  Fax number: [phone] [email or web address]

## 2020-11-01 NOTE — Progress Notes (Signed)
[place] Rm [id number] Manufacturing engineer [facility]) Hospital Liaison note:  This patient is currently enrolled in [facility] outpatient-based Palliative Care. Will continue to follow for disposition.  Please call with any outpatient palliative questions or concerns.  Thank you, Lorelee Market, LPN [facility] Liaison [phone]

## 2020-11-01 NOTE — Evaluation (Signed)
Physical Therapy Evaluation Patient Details Name: [name] MRN: [id number] DOB: [birth year] Today's Date: 11/01/2020   History of Present Illness  87 y/o female with a history of AF on Eliquis, presenting with choledocholithiasis / cholangitis, bacteremic with E.coli. MRCP 7/21 shows choledocholithiasis with 5-47m CBD stone. [facility]: afib with RVR, recent covid   Clinical Impression  Pt admitted with above. Pt pleasant and cooperative, eager to move. Pt is at high risk of falls and does need 24/7 supervision for safe d/c home however pt moving near baseline but has had several recent falls. Acute PT to cont to follow.    Follow Up Recommendations Home health PT;Supervision/Assistance - 24 hour    Equipment Recommendations  None recommended by PT    Recommendations for Other Services       Precautions / Restrictions Precautions Precautions: Fall Restrictions Weight Bearing Restrictions: No      Mobility  Bed Mobility               General bed mobility comments: pt up in chair upon PT arrival    Transfers Overall transfer level: Needs assistance Equipment used: Rolling walker (2 wheeled) Transfers: Sit to/from Stand Sit to Stand: Min guard         General transfer comment: increased time, verbal cues for hand placement  Ambulation/Gait Ambulation/Gait assistance: Min assist Gait Distance (Feet): 100 Feet Assistive device: Rolling walker (2 wheeled) Gait Pattern/deviations: Step-through pattern;Decreased stride length;Trunk flexed Gait velocity: dec   General Gait Details: slow but steady, decresaed step height, minA for walker management during turns and around obstacles due to pt normally with a rollator  Stairs            Wheelchair Mobility    Modified Rankin (Stroke Patients Only)       Balance Overall balance assessment: Needs assistance Sitting-balance support: No upper extremity  supported Sitting balance-Leahy Scale: Good     Standing balance support: Bilateral upper extremity supported Standing balance-Leahy Scale: Fair Standing balance comment: needs RW for amb                             Pertinent Vitals/Pain Pain Assessment: No/denies pain (but R shin hypersensitive to touch)    Home Living Family/patient expects to be discharged to:: Private residence Living Arrangements: Alone Available Help at Discharge: Family;Personal care attendant;Available PRN/intermittently Type of Home: Apartment Home Access: Level entry     Home Layout: One level Home Equipment: Walker - 4 wheels;Shower seat;Grab bars - toilet;Grab bars - tub/shower      Prior Function Level of Independence: Needs assistance   Gait / Transfers Assistance Needed: pt uses rollator, doesn't drive anymore  ADL's / Homemaking Assistance Needed: pt reports someone supervises her while she showers but she does it herself, assist for meal prep as well        Hand Dominance   Dominant Hand: Right    Extremity/Trunk Assessment   Upper Extremity Assessment Upper Extremity Assessment: Generalized weakness    Lower Extremity Assessment Lower Extremity Assessment: Generalized weakness    Cervical / Trunk Assessment Cervical / Trunk Assessment: Kyphotic  Communication   Communication: HOH  Cognition Arousal/Alertness: Awake/alert Behavior During Therapy: WFL for tasks assessed/performed Overall Cognitive Status: Within Functional Limits for tasks assessed  General Comments: aware PMH says dementia however pt alert, oriented and accurate with PLOF and home set up support      General Comments General comments (skin integrity, edema, etc.): Per RN pts HR increased to 130s from 117 during amb, aware pt is in a-fib with RVR    Exercises     Assessment/Plan    PT Assessment Patient needs continued PT services  PT Problem  List Decreased strength;Decreased activity tolerance;Decreased balance;Decreased mobility;Decreased coordination;Decreased cognition;Decreased knowledge of use of DME;Decreased safety awareness;Decreased knowledge of precautions;Cardiopulmonary status limiting activity       PT Treatment Interventions DME instruction;Gait training;Functional mobility training;Therapeutic activities;Therapeutic exercise;Balance training;Patient/family education    PT Goals (Current goals can be found in the Care Plan section)  Acute Rehab PT Goals PT Goal Formulation: With patient/family Time For Goal Achievement: 11/16/20 Potential to Achieve Goals: Good    Frequency Min 2X/week   Barriers to discharge        Co-evaluation               AM-PAC PT "6 Clicks" Mobility  Outcome Measure Help needed turning from your back to your side while in a flat bed without using bedrails?: A Little Help needed moving from lying on your back to sitting on the side of a flat bed without using bedrails?: A Little Help needed moving to and from a bed to a chair (including a wheelchair)?: A Little Help needed standing up from a chair using your arms (e.g., wheelchair or bedside chair)?: A Little Help needed to walk in hospital room?: A Little Help needed climbing 3-5 steps with a railing? : A Little 6 Click Score: 18    End of Session Equipment Utilized During Treatment: Gait belt Activity Tolerance: Patient tolerated treatment well Patient left: in chair;with call bell/phone within reach;with chair alarm set Nurse Communication: Mobility status PT Visit Diagnosis: Muscle weakness (generalized) (M62.81);Difficulty in walking, not elsewhere classified (R26.2)    Time: [id number] PT Time Calculation (min) (ACUTE ONLY): 21 min   Charges:   PT Evaluation $PT Eval Low Complexity: 1 Low          Kittie Plater, PT, DPT Acute Rehabilitation Services Pager #: [phone] Office #: [phone]   Berline Lopes 11/01/2020, 9:33 AM

## 2020-11-01 NOTE — TOC Transition Note (Addendum)
Transition of Care [facility]) - CM/SW Discharge Note   Patient Details  Name: [name] MRN: [id number] Date of Birth: June 08, 1929  [facility] CM/SW Contact:  Zenon Mayo, RN Phone Number: 11/01/2020, 11:57 AM   Clinical Narrative:    NCM spoke with patient at the bedside, she states to call her daughter she has [employer] [facility] [employer] services but could not remember the name of the agency.  NCM contacted [name] the [place], she states patient is with [facility] and they would like to stay with [facility]. NCM confirmed with [name].  Services will resume at dc. Her daughters will provide 24 hr care at home for patient. [name] , states she will transport patient home.   Final next level of care: [place] Barriers to Discharge: No Barriers Identified   Patient Goals and CMS Choice Patient states their goals for this hospitalization and ongoing recovery are:: home with daughter with her CMS Medicare.gov Compare Post Acute Care list provided to:: Patient Represenative (must comment) Choice offered to / list presented to : [place] / Guardian  Discharge Placement                       Discharge Plan and Services                  DME Agency: NA       HH Arranged: RN, PT, OT, Nurse's Aide [place] Agency: [facility] Date [facility] Agency Contacted: 10/26/20 Time [place]: [id number] Representative spoke with at [place]: [place] Determinants of Health ([place]) Interventions     Readmission Risk Interventions No flowsheet data found.

## 2020-11-01 NOTE — Progress Notes (Signed)
[name] to be D/C'd  per MD order.  Discussed with the patient and all questions fully answered.  VSS, Skin clean, dry and intact without evidence of skin break down, no evidence of skin tears noted.  Gave Flagyl before discontinued IV, patient tolerated well.  IV catheter discontinued intact. Site without signs and symptoms of complications. Dressing and pressure applied.  Went over discharge summary with daughter via telephone. An After Visit Summary was printed and given to the patient and daughter [name].   Patient instructed to return to ED, call 911, or call MD for any changes in condition.   Patient to be escorted via [place], and D/C home via private auto.

## 2020-11-01 NOTE — Progress Notes (Signed)
Pt convert back to A-Fib at 0632 this morning.HR 117, while walking with PT, HR 135. Will continue to monitor.

## 2020-11-02 ENCOUNTER — Other Ambulatory Visit: Payer: Self-pay | Admitting: Cardiovascular Disease

## 2020-11-02 ENCOUNTER — Telehealth: Payer: Self-pay | Admitting: Student

## 2020-11-02 ENCOUNTER — Encounter (HOSPITAL_COMMUNITY): Payer: Self-pay | Admitting: Gastroenterology

## 2020-11-02 NOTE — Telephone Encounter (Signed)
Spoke with patient's daughter, [name], and have scheduled a Palliative f/u visit for 11/07/20 @ 11 AM (post hospital discharged on 11/01/20).

## 2020-11-05 NOTE — Discharge Summary (Signed)
[facility] Discharge Summary   [name] [id number]  PCP: Ria Bush, MD  Date of admission: 10/27/2020   Date of discharge: 11/01/2020      Discharge Diagnoses:  Principal Problem:   Choledocholithiasis Active Problems:   HLD (hyperlipidemia)   Essential hypertension   GERD   Pulmonary hypertension, unspecified ([place])   CAD (coronary artery disease), native coronary artery   CKD (chronic kidney disease) stage 3, GFR 30-59 ml/min (HCC)   Chronic diastolic CHF (congestive heart failure) ([place])   DNR (do not resuscitate)   Osteomyelitis ([place])   COVID-19 virus infection   Bacteremia due to Gram-negative bacteria   Admitted From: home Disposition:  Home with home health  Recommendations for Outpatient Follow-up:  PCP: Follow-up with PCP in 1 to 2 weeks.  Follow-up with surgery as needed.   Follow-up Information     [facility] [place] Follow up today.   Specialty: General Surgery Why: As needed Contact information: [place] [address] [phone]         Ria Bush, MD. Schedule an appointment as soon as possible for a visit in 2 week(s).   Specialty: Family Medicine Contact information: [place] [address] [place], [place] [facility] Follow up.   Specialty: [place] Why: They will call you to set up apt times Contact information: [place] [address] [phone]         [facility] Follow up.   Why: currently enrolled in outpatient palliative services Contact information: [place] [phone]               Discharge Instructions     Diet - low sodium heart healthy   Complete by: As directed    Increase activity slowly   Complete by: As directed    No wound care   Complete by: As directed        Diet recommendation:  Cardiac diet  Activity: The patient is advised to gradually reintroduce usual activities, as tolerated  Discharge Condition: stable  Code Status: DNR   History of present illness: As per the H and P dictated on admission, "[name] is a 87 y.o. female with medical history significant of [name] is an 87 y.o. female with a history of CAD, HTN, chronic AFib, PVD, right 5th toe/metatarsal osteoarthritis on doxycycline, covid-19 diagnosed 10/02/2020 treated with paxlovid, and cholelithiasis reported to the ED from home 7/20 after 2 falls and generalized weakness. Work up revealed elevated LFTs (AST 609, ALT 505, TBili 2.4), lacitc acid elevated to 3.4, leukocytosis to 15.2k. Right foot XR confirmed erosive bony changes consistent with osteomyelitis of the 5th digit. RUQ U/S demonstrated gallstone of 1.2cm, and sludge ball of 3.2cm without wall thickening or pericholecystic fluid. Blood cultures were drawn, patient placed on zosyn with surgery consulted. Blood cultures grew E. coli for which ceftriaxone is continued. Bilirubin increased, so MRCP was performed revealing no cholecystitis, but evidence of choledocholithiasis for which ERCP is recommended and flagyl is added. The patient is now transferred to [facility] for formal GI evaluation as no ERCP capabilities are available at this time at [facility] Course:  Summary of her active problems in the hospital is as following. 1.  Ascending cholangitis E. coli bacteremia Currently on ceftriaxone and Flagyl.  We  will discontinue Flagyl. GI consulted. Underwent ERCP. No CBD stone but some bile duct sludge.  Sphincterotomy performed. Tolerating soft diet. No evidence of pancreatitis clinically as well as on labs. General surgery consulted.  Patient does not want to go through surgery and family also does not want to pursue surgery.  2.  Cholelithiasis General surgery consulted. Patient does not have any interest in  surgery. Monitor outpatient.   3.  HTN Chronic A. Fib Hypotension Rate controlled with amiodarone and metoprolol. Blood pressure was rather high in the morning. Later in the day after receiving lisinopril and Lopressor patient blood pressure dropped. So far no other etiology other than blood pressure medications themself being a problem.  Could be associated with her fall at home as well. Will discontinue lisinopril and switch Lopressor to Toprol-XL to avoid peak-effect. Hemoglobin stable. On therapeutic anticoagulation. Resume Eliquis.   4.  Anemia. Likely dilutional in nature. Hemoglobin was 11 for last 2 checks. On repeat 9.  Further repeat labs showed stable hemoglobin numbers around 10.   5.  Neuropathy Restless leg syndrome Continue gabapentin.  Continue Requip.   6.  PVD Chronic wound On chronic doxycycline.  Currently on hold. Resume at discharge. Follow-up with podiatry outpatient. Probiotics added.   7.  CKD 3A Serum creatinine stable.  Monitor.   8. History of dementia No behavioral issues. On memantine.  Continue.  Patient was seen by physical therapy, who recommended Home Health, . On the day of the discharge the patient's vitals were stable, and no other new acute medical condition were reported. The patient was felt safe to be discharge at Home with Home health.  Consultants: Gastroenterology General surgery  Procedures: ERCP with sphincterotomy  DISCHARGE MEDICATION: Allergies as of 11/01/2020       Reactions   Penicillins Rash, Other (See Comments)   Childhood Allergy Has patient had a PCN reaction causing immediate rash, facial/tongue/throat swelling, SOB or lightheadedness with hypotension: Yes Has patient had a PCN reaction causing severe rash involving mucus membranes or skin necrosis: Unknown Has patient had a PCN reaction that required hospitalization: No Has patient had a PCN reaction occurring within the last 10 years: No If all of the  above answers are "NO", then may proceed with Cephalosporin use.        Medication List     STOP taking these medications    lisinopril 10 MG tablet Commonly known as: ZESTRIL   metoprolol tartrate 25 MG tablet Commonly known as: LOPRESSOR   metroNIDAZOLE 500 MG/100ML Commonly known as: FLAGYL   sennosides-docusate sodium 8.6-50 MG tablet Commonly known as: SENOKOT-S       TAKE these medications    acetaminophen 500 MG tablet Commonly known as: TYLENOL Take 500 mg by mouth every 6 (six) hours as needed for mild pain.   acyclovir 400 MG tablet Commonly known as: ZOVIRAX Take 400 mg by mouth 2 (two) times daily.   Alpha-Lipoic Acid 600 MG Tabs Take 1 tablet by mouth daily.   amiodarone 100 MG tablet Commonly known as: PACERONE Take 100 mg by mouth daily.   aspirin EC 81 MG tablet Take 81 mg by mouth daily. Swallow whole.   atorvastatin 20 MG tablet Commonly known as: LIPITOR Take 20 mg by mouth daily.   cefpodoxime 200 MG tablet Commonly known as: VANTIN Take 1 tablet (200 mg total) by mouth 2 (two) times daily for 10 days.   doxycycline 100 MG tablet Commonly known as: VIBRA-TABS Take 1 tablet (100  mg total) by mouth 2 (two) times daily.   DULoxetine 60 MG capsule Commonly known as: CYMBALTA TAKE 1 CAPSULE BY MOUTH ONCE DAILY   Eliquis 2.5 MG Tabs tablet Generic drug: apixaban Take 2.5 mg by mouth 2 (two) times daily.   gabapentin 400 MG capsule Commonly known as: Neurontin Take 1 capsule (400 mg total) by mouth at bedtime. With second dose during day as needed What changed:  when to take this additional instructions   memantine 5 MG tablet Commonly known as: NAMENDA TAKE 1 TABLET BY MOUTH TWICE DAILY   metoprolol succinate 25 MG 24 hr tablet Commonly known as: TOPROL-XL Take 1 tablet (25 mg total) by mouth daily.   pantoprazole 40 MG tablet Commonly known as: PROTONIX TAKE 1 TABLET BY MOUTH EVERY DAY   polyethylene glycol 17 g  packet Commonly known as: MIRALAX / GLYCOLAX Take 17 g by mouth daily as needed for mild constipation.   prednisoLONE acetate 1 % ophthalmic suspension Commonly known as: PRED FORTE Place 1 drop into the left eye daily.   rOPINIRole 1 MG tablet Commonly known as: REQUIP Take 2.5 tablets (2.5 mg total) by mouth at bedtime.   saccharomyces boulardii 250 MG capsule Commonly known as: Florastor Take 1 capsule (250 mg total) by mouth 2 (two) times daily for 14 days.   silver sulfADIAZINE 1 % cream Commonly known as: SILVADENE Apply 1 application topically daily. What changed: additional instructions   tiZANidine 2 MG tablet Commonly known as: ZANAFLEX Take 2 mg by mouth daily.   Vitamin D (Ergocalciferol) 1.25 MG (50000 UNIT) Caps capsule Commonly known as: DRISDOL TAKE 1 CAPSULE BY MOUTH ONCE WEEKLY What changed: when to take this        Discharge Exam: There were no vitals filed for this visit. Vitals:   11/01/20 1140 11/01/20 1200  BP:  106/78  Pulse: (!) 108   Resp: 16   Temp: 97.8 F (36.6 C)   SpO2: 92%    General: Appear in mild distress, no Rash; Oral Mucosa Clear, moist. no Abnormal Neck Mass Or lumps, Conjunctiva normal  Cardiovascular: S1 and S2 Present, no Murmur, Respiratory: good respiratory effort, Bilateral Air entry present and CTA, no Crackles, no wheezes Abdomen: Bowel Sound present, Soft and no tenderness Extremities: no Pedal edema Neurology: alert and oriented to time, place, and person affect appropriate. no new focal deficit Gait not checked due to patient safety concerns  The results of significant diagnostics from this hospitalization (including imaging, microbiology, ancillary and laboratory) are listed below for reference.    Significant Diagnostic Studies: DG Chest 1 View  Result Date: 10/25/2020 CLINICAL DATA:  Weakness. EXAM: CHEST  1 VIEW COMPARISON:  01/19/2020.  CT 07/06/2019. FINDINGS: Cardiomegaly. No pulmonary venous  congestion. Low lung volumes. Mild left base atelectasis/scarring. No focal infiltrate. No pleural effusion or pneumothorax. Degenerative change and scoliosis thoracic spine. Reference is made to prior chest CT report for discussion of thoracic spine compression fractures. Surgical clips right chest IMPRESSION: 1.  Cardiomegaly.  No pulmonary venous congestion. 2.  No acute pulmonary disease. Electronically Signed   By: Marcello Moores  Register   On: 10/25/2020 08:18   CT Head Wo Contrast  Result Date: 10/25/2020 CLINICAL DATA:  Head trauma, neck trauma EXAM: CT HEAD WITHOUT CONTRAST CT CERVICAL SPINE WITHOUT CONTRAST TECHNIQUE: Multidetector CT imaging of the head and cervical spine was performed following the standard protocol without intravenous contrast. Multiplanar CT image reconstructions of the cervical spine were also generated. COMPARISON:  07/03/2018 FINDINGS: CT HEAD FINDINGS Brain: No evidence of acute infarction, hemorrhage, hydrocephalus, extra-axial collection or mass lesion/mass effect. Moderate low-density changes within the periventricular and subcortical white matter compatible with chronic microvascular ischemic change. Mild diffuse cerebral volume loss. Vascular: Atherosclerotic calcifications involving the large vessels of the skull base. No unexpected hyperdense vessel. Skull: Normal. Negative for fracture or focal lesion. Sinuses/Orbits: No acute finding. Other: Negative for scalp hematoma. CT CERVICAL SPINE FINDINGS Alignment: Facet joints are aligned without dislocation or traumatic listhesis. Dens and lateral masses are aligned. Straightening of the cervical lordosis. Similar degree of grade 1 anterolisthesis of C3 on C4. Slight retrolisthesis of C5 on C6. Skull base and vertebrae: No acute fracture. No primary bone lesion or focal pathologic process. Soft tissues and spinal canal: No prevertebral fluid or swelling. No visible canal hematoma. Disc levels: Multilevel degenerative disc disease,  most pronounced at C6-7. Multilevel bilateral facet joint arthropathy, slightly more pronounced on the right. No significant interval progression from prior. Upper chest: Included lung apices demonstrate biapical pleuroparenchymal scarring, similar to prior. Other: Bilateral carotid atherosclerosis. IMPRESSION: 1. No acute intracranial findings. 2. No evidence of acute traumatic injury to the cervical spine. 3. Moderate chronic microvascular ischemic changes and cerebral volume loss. 4. Multilevel cervical spondylosis, similar to prior. Electronically Signed   By: Davina Poke D.O.   On: 10/25/2020 08:21   CT Cervical Spine Wo Contrast  Result Date: 10/25/2020 CLINICAL DATA:  Head trauma, neck trauma EXAM: CT HEAD WITHOUT CONTRAST CT CERVICAL SPINE WITHOUT CONTRAST TECHNIQUE: Multidetector CT imaging of the head and cervical spine was performed following the standard protocol without intravenous contrast. Multiplanar CT image reconstructions of the cervical spine were also generated. COMPARISON:  07/03/2018 FINDINGS: CT HEAD FINDINGS Brain: No evidence of acute infarction, hemorrhage, hydrocephalus, extra-axial collection or mass lesion/mass effect. Moderate low-density changes within the periventricular and subcortical white matter compatible with chronic microvascular ischemic change. Mild diffuse cerebral volume loss. Vascular: Atherosclerotic calcifications involving the large vessels of the skull base. No unexpected hyperdense vessel. Skull: Normal. Negative for fracture or focal lesion. Sinuses/Orbits: No acute finding. Other: Negative for scalp hematoma. CT CERVICAL SPINE FINDINGS Alignment: Facet joints are aligned without dislocation or traumatic listhesis. Dens and lateral masses are aligned. Straightening of the cervical lordosis. Similar degree of grade 1 anterolisthesis of C3 on C4. Slight retrolisthesis of C5 on C6. Skull base and vertebrae: No acute fracture. No primary bone lesion or focal  pathologic process. Soft tissues and spinal canal: No prevertebral fluid or swelling. No visible canal hematoma. Disc levels: Multilevel degenerative disc disease, most pronounced at C6-7. Multilevel bilateral facet joint arthropathy, slightly more pronounced on the right. No significant interval progression from prior. Upper chest: Included lung apices demonstrate biapical pleuroparenchymal scarring, similar to prior. Other: Bilateral carotid atherosclerosis. IMPRESSION: 1. No acute intracranial findings. 2. No evidence of acute traumatic injury to the cervical spine. 3. Moderate chronic microvascular ischemic changes and cerebral volume loss. 4. Multilevel cervical spondylosis, similar to prior. Electronically Signed   By: Davina Poke D.O.   On: 10/25/2020 08:21   MR 3D Recon At Scanner  Result Date: 10/26/2020 CLINICAL DATA:  Jaundice, cholelithiasis EXAM: MRI ABDOMEN WITHOUT AND WITH CONTRAST (INCLUDING MRCP) TECHNIQUE: Multiplanar multisequence MR imaging of the abdomen was performed both before and after the administration of intravenous contrast. Heavily T2-weighted images of the biliary and pancreatic ducts were obtained, and three-dimensional MRCP images were rendered by post processing. CONTRAST:  11m GADAVIST GADOBUTROL 1 MMOL/ML IV  SOLN COMPARISON:  Right upper quadrant ultrasound dated 10/25/2020 FINDINGS: Motion degraded images. Lower chest: Lung bases are clear. Hepatobiliary: Signal loss in the liver on inphase imaging, suggesting iron deposition. No suspicious/enhancing hepatic lesions. Distended gallbladder with a 13 mm gallstone and gallbladder sludge (series 17/image 20). No associated inflammatory changes. No intrahepatic or extrahepatic ductal dilatation. Common duct measures 7 mm. Associated 5-6 mm mid CBD stone (series 3/image 18). Pancreas:  Within normal limits. Spleen: Signal loss in the spleen on inphase imaging, suggesting iron deposition. Adrenals/Urinary Tract:  Adrenal glands  are within normal limits. Kidneys are within normal limits.  No hydronephrosis. Stomach/Bowel: Stomach is notable for a large hiatal hernia/inverted intrathoracic stomach. Visualized bowel is grossly unremarkable. Vascular/Lymphatic:  No evidence of abdominal aortic aneurysm. No suspicious abdominal lymphadenopathy. Other:  No abdominal ascites. Musculoskeletal: Degenerative changes of the lumbar spine. IMPRESSION: Motion degraded images. Cholelithiasis with a suspected 5-6 mm mid CBD stone. No intrahepatic or extrahepatic ductal dilatation. Common duct measures 7 mm. Consider ERCP as clinically warranted. Cholelithiasis, without evidence of acute cholecystitis. Suspected iron deposition in the liver and spleen. Electronically Signed   By: Julian Hy M.D.   On: 10/26/2020 15:59   DG Chest Port 1 View  Result Date: 10/27/2020 CLINICAL DATA:  Shortness of breath EXAM: PORTABLE CHEST 1 VIEW COMPARISON:  10/25/2020 FINDINGS: Mild interstitial pulmonary edema. No pleural effusion or pneumothorax. No focal consolidation. Normal cardiomediastinal contours. IMPRESSION: Mild interstitial pulmonary edema. Electronically Signed   By: Ulyses Jarred M.D.   On: 10/27/2020 01:35   DG ERCP BILIARY & PANCREATIC DUCTS  Result Date: 10/30/2020 CLINICAL DATA:  Choledocholithiasis EXAM: ERCP TECHNIQUE: Multiple spot images obtained with the fluoroscopic device and submitted for interpretation post-procedure. FLUOROSCOPY TIME:  Fluoroscopy Time:  1 minutes 23 seconds Radiation Exposure Index (if provided by the fluoroscopic device): 14.26 mGy Number of Acquired Spot Images: 0 COMPARISON:  MRCP 10/26/2020 FINDINGS: A total of 5 intraoperative spot images are submitted for review. The images demonstrate a flexible duodenal scope in the descending duodenum with wire cannulation of the common bile duct. The cystic duct is patent as there is contrast material within the gallbladder. IMPRESSION: Intraoperative saved images  obtained during ERCP. These images were submitted for radiologic interpretation only. Please see the procedural report for the amount of contrast and the fluoroscopy time utilized. Electronically Signed   By: Jacqulynn Cadet M.D.   On: 10/30/2020 10:45   DG Foot Complete Left  Result Date: 10/25/2020 CLINICAL DATA:  Soft tissue wound, possible osteomyelitis EXAM: LEFT FOOT - COMPLETE 3+ VIEW COMPARISON:  None. FINDINGS: Small soft tissue defect is noted laterally adjacent to the head of the fifth metatarsal. No underlying bony abnormality is seen. No acute fracture or dislocation is noted. IMPRESSION: Soft tissue changes without acute erosive change to suggest osteomyelitis. Electronically Signed   By: Inez Catalina M.D.   On: 10/25/2020 09:16   DG Foot Complete Right  Result Date: 10/25/2020 CLINICAL DATA:  Evaluation for osteomyelitis. EXAM: RIGHT FOOT COMPLETE - 3+ VIEW COMPARISON:  MRI 11/01/2019.  Right foot series 10/23/2019. FINDINGS: Erosive changes noted the distal aspect of the right fifth metatarsal and at the base of the proximal phalanx of the right fifth digit. Findings consistent with septic arthritis/osteomyelitis. No fracture or dislocation. No radiopaque foreign body. IMPRESSION: Erosive changes noted about the distal aspect of the right fifth metatarsal and at the base of the proximal phalanx of the right fifth digit. Findings consistent with septic arthritis/osteomyelitis. Electronically  Signed   By: Marcello Moores  Register   On: 10/25/2020 09:17   DG Foot Complete Right  Result Date: 10/12/2020 CLINICAL DATA:  Chronic wound on the lateral aspect of the right foot. Pain and swelling. EXAM: RIGHT FOOT COMPLETE - 3+ VIEW COMPARISON:  Plain films right foot 10/23/2019. FINDINGS: There is bony destructive change in the distal 1.3 cm of the fifth metatarsal consistent with osteomyelitis. Bony destructive change is also seen in the articular surface of the base of the fifth toe consistent with  osteomyelitis. There appears to be a nondisplaced fracture through the neck of the fifth toe. No other acute bony abnormality is identified. Scattered interphalangeal joint osteoarthritis is noted. IMPRESSION: Findings consistent with osteomyelitis in the distal fifth metatarsal and base of the proximal phalanx of the fifth toe. Likely transverse fracture through the neck of the proximal phalanx of the fifth toe is also identified and may be secondary to infection as no history of trauma. Electronically Signed   By: Inge Rise M.D.   On: 10/12/2020 13:39   MR ABDOMEN MRCP W WO CONTAST  Result Date: 10/26/2020 CLINICAL DATA:  Jaundice, cholelithiasis EXAM: MRI ABDOMEN WITHOUT AND WITH CONTRAST (INCLUDING MRCP) TECHNIQUE: Multiplanar multisequence MR imaging of the abdomen was performed both before and after the administration of intravenous contrast. Heavily T2-weighted images of the biliary and pancreatic ducts were obtained, and three-dimensional MRCP images were rendered by post processing. CONTRAST:  72m GADAVIST GADOBUTROL 1 MMOL/ML IV SOLN COMPARISON:  Right upper quadrant ultrasound dated 10/25/2020 FINDINGS: Motion degraded images. Lower chest: Lung bases are clear. Hepatobiliary: Signal loss in the liver on inphase imaging, suggesting iron deposition. No suspicious/enhancing hepatic lesions. Distended gallbladder with a 13 mm gallstone and gallbladder sludge (series 17/image 20). No associated inflammatory changes. No intrahepatic or extrahepatic ductal dilatation. Common duct measures 7 mm. Associated 5-6 mm mid CBD stone (series 3/image 18). Pancreas:  Within normal limits. Spleen: Signal loss in the spleen on inphase imaging, suggesting iron deposition. Adrenals/Urinary Tract:  Adrenal glands are within normal limits. Kidneys are within normal limits.  No hydronephrosis. Stomach/Bowel: Stomach is notable for a large hiatal hernia/inverted intrathoracic stomach. Visualized bowel is grossly  unremarkable. Vascular/Lymphatic:  No evidence of abdominal aortic aneurysm. No suspicious abdominal lymphadenopathy. Other:  No abdominal ascites. Musculoskeletal: Degenerative changes of the lumbar spine. IMPRESSION: Motion degraded images. Cholelithiasis with a suspected 5-6 mm mid CBD stone. No intrahepatic or extrahepatic ductal dilatation. Common duct measures 7 mm. Consider ERCP as clinically warranted. Cholelithiasis, without evidence of acute cholecystitis. Suspected iron deposition in the liver and spleen. Electronically Signed   By: SJulian HyM.D.   On: 10/26/2020 15:59   [place]ABDOMEN LIMITED RUQ (LIVER/GB)  Result Date: 10/25/2020 CLINICAL DATA:  Nausea. EXAM: ULTRASOUND ABDOMEN LIMITED RIGHT UPPER QUADRANT COMPARISON:  CT 07/06/2019. FINDINGS: Gallbladder: 1.2 cm gallstone noted. 3.2 cm sludge ball noted. Gallbladder wall thickness normal. No pericholecystic fluid. Common bile duct: Diameter: 4 mm Liver: No focal lesion identified. Within normal limits in parenchymal echogenicity. Portal vein is patent on color Doppler imaging with normal direction of blood flow towards the liver. Other: None. IMPRESSION: Prominent gallstone and sludge ball within the gallbladder noted. Gallbladder wall thickness normal. No biliary distention. Electronically Signed   By: TMarcello Moores Register   On: 10/25/2020 11:03    Microbiology: No results found for this or any previous visit (from the past 240 hour(s)).   Labs: CBC: Recent Labs  Lab 10/30/20 0042 10/31/20 0255 10/31/20 1254 10/31/20  1750 11/01/20 0338  WBC 6.9 7.9 7.1 7.7 8.0  NEUTROABS 5.2 6.7  --   --   --   HGB 11.0* 10.8* 8.9* 10.3* 10.4*  HCT 34.3* 32.8* 28.1* 31.4* 32.4*  MCV 102.1* 101.5* 104.1* 104.3* 102.9*  PLT 238 287 239 261 [id number]   Basic Metabolic Panel: Recent Labs  Lab 10/30/20 0042 10/31/20 0255 11/01/20 0338  NA 135 136 139  K 3.7 4.1 3.3*  CL 105 106 108  CO2 '22 24 25  '$ GLUCOSE 95 131* 125*  BUN 5* 6* 11   CREATININE 0.66 0.72 0.96  CALCIUM 8.0* 8.0* 8.3*   Liver Function Tests: Recent Labs  Lab 10/30/20 0042 10/31/20 0255 11/01/20 0338  AST '23 19 23  '$ ALT 119* 86* 71*  ALKPHOS 181* 161* 151*  BILITOT 0.9 0.6 0.6  PROT 5.4* 5.1* 4.9*  ALBUMIN 2.6* 2.5* 2.5*   CBG: No results for input(s): GLUCAP in the last 168 hours.  Time spent: 35 minutes  Signed:  Berle Mull  [facility] 11/01/2020

## 2020-11-06 ENCOUNTER — Encounter: Payer: Self-pay | Admitting: Family Medicine

## 2020-11-06 ENCOUNTER — Telehealth: Payer: Self-pay

## 2020-11-06 ENCOUNTER — Ambulatory Visit (INDEPENDENT_AMBULATORY_CARE_PROVIDER_SITE_OTHER): Payer: Medicare Other | Admitting: Family Medicine

## 2020-11-06 ENCOUNTER — Other Ambulatory Visit: Payer: Self-pay

## 2020-11-06 VITALS — BP 164/90 | HR 72 | Temp 98.7°F | Ht 61.0 in | Wt 127.1 lb

## 2020-11-06 DIAGNOSIS — M5136 Other intervertebral disc degeneration, lumbar region: Secondary | ICD-10-CM | POA: Diagnosis not present

## 2020-11-06 DIAGNOSIS — R296 Repeated falls: Secondary | ICD-10-CM | POA: Diagnosis not present

## 2020-11-06 DIAGNOSIS — E538 Deficiency of other specified B group vitamins: Secondary | ICD-10-CM | POA: Diagnosis not present

## 2020-11-06 DIAGNOSIS — M86671 Other chronic osteomyelitis, right ankle and foot: Secondary | ICD-10-CM

## 2020-11-06 DIAGNOSIS — Z515 Encounter for palliative care: Secondary | ICD-10-CM | POA: Diagnosis not present

## 2020-11-06 DIAGNOSIS — U071 COVID-19: Secondary | ICD-10-CM

## 2020-11-06 DIAGNOSIS — I4891 Unspecified atrial fibrillation: Secondary | ICD-10-CM | POA: Diagnosis not present

## 2020-11-06 DIAGNOSIS — R7881 Bacteremia: Secondary | ICD-10-CM

## 2020-11-06 DIAGNOSIS — K219 Gastro-esophageal reflux disease without esophagitis: Secondary | ICD-10-CM | POA: Diagnosis not present

## 2020-11-06 DIAGNOSIS — K8309 Other cholangitis: Secondary | ICD-10-CM

## 2020-11-06 DIAGNOSIS — N1831 Chronic kidney disease, stage 3a: Secondary | ICD-10-CM

## 2020-11-06 DIAGNOSIS — I10 Essential (primary) hypertension: Secondary | ICD-10-CM

## 2020-11-06 DIAGNOSIS — I130 Hypertensive heart and chronic kidney disease with heart failure and stage 1 through stage 4 chronic kidney disease, or unspecified chronic kidney disease: Secondary | ICD-10-CM | POA: Diagnosis not present

## 2020-11-06 DIAGNOSIS — M1990 Unspecified osteoarthritis, unspecified site: Secondary | ICD-10-CM | POA: Diagnosis not present

## 2020-11-06 DIAGNOSIS — Z7982 Long term (current) use of aspirin: Secondary | ICD-10-CM | POA: Diagnosis not present

## 2020-11-06 DIAGNOSIS — I2510 Atherosclerotic heart disease of native coronary artery without angina pectoris: Secondary | ICD-10-CM | POA: Diagnosis not present

## 2020-11-06 DIAGNOSIS — I739 Peripheral vascular disease, unspecified: Secondary | ICD-10-CM | POA: Diagnosis not present

## 2020-11-06 DIAGNOSIS — G8929 Other chronic pain: Secondary | ICD-10-CM

## 2020-11-06 DIAGNOSIS — I5032 Chronic diastolic (congestive) heart failure: Secondary | ICD-10-CM | POA: Diagnosis not present

## 2020-11-06 DIAGNOSIS — R531 Weakness: Secondary | ICD-10-CM | POA: Diagnosis not present

## 2020-11-06 DIAGNOSIS — G2581 Restless legs syndrome: Secondary | ICD-10-CM | POA: Diagnosis not present

## 2020-11-06 DIAGNOSIS — M810 Age-related osteoporosis without current pathological fracture: Secondary | ICD-10-CM | POA: Diagnosis not present

## 2020-11-06 DIAGNOSIS — I4819 Other persistent atrial fibrillation: Secondary | ICD-10-CM

## 2020-11-06 DIAGNOSIS — I272 Pulmonary hypertension, unspecified: Secondary | ICD-10-CM | POA: Diagnosis not present

## 2020-11-06 DIAGNOSIS — M79604 Pain in right leg: Secondary | ICD-10-CM | POA: Diagnosis not present

## 2020-11-06 DIAGNOSIS — Z9181 History of falling: Secondary | ICD-10-CM | POA: Diagnosis not present

## 2020-11-06 DIAGNOSIS — Z7901 Long term (current) use of anticoagulants: Secondary | ICD-10-CM | POA: Diagnosis not present

## 2020-11-06 DIAGNOSIS — I872 Venous insufficiency (chronic) (peripheral): Secondary | ICD-10-CM | POA: Diagnosis not present

## 2020-11-06 DIAGNOSIS — G6289 Other specified polyneuropathies: Secondary | ICD-10-CM

## 2020-11-06 DIAGNOSIS — E7849 Other hyperlipidemia: Secondary | ICD-10-CM | POA: Diagnosis not present

## 2020-11-06 DIAGNOSIS — K8050 Calculus of bile duct without cholangitis or cholecystitis without obstruction: Secondary | ICD-10-CM

## 2020-11-06 DIAGNOSIS — E440 Moderate protein-calorie malnutrition: Secondary | ICD-10-CM

## 2020-11-06 DIAGNOSIS — M79605 Pain in left leg: Secondary | ICD-10-CM

## 2020-11-06 DIAGNOSIS — G9009 Other idiopathic peripheral autonomic neuropathy: Secondary | ICD-10-CM | POA: Diagnosis not present

## 2020-11-06 MED ORDER — VITAMIN B-12 1000 MCG PO TABS: 1000.0000 ug | ORAL_TABLET | Freq: Every day | ORAL | Status: DC

## 2020-11-06 NOTE — Telephone Encounter (Signed)
Prolia VOB initiated via [email or web address]  Last OV: 10/11/20 Next OV: 11/06/20 Last Prolia inj: 07/04/20 Next Prolia inj DUE:  01/05/21

## 2020-11-06 NOTE — Patient Instructions (Addendum)
You are doing well today - good to see you Blood pressures are staying elevated - start monitoring blood pressures at home and let me know if consistently >150/90 to discuss changing blood pressure medicines. Return as needed or in 3 months for follow up visit.

## 2020-11-06 NOTE — Progress Notes (Signed)
Patient ID: [name], female    DOB: July 20, 1929, 87 y.o.   MRN: [id number]  This visit was conducted in person.  BP (!) 164/90   Pulse 72   Temp 98.7 F (37.1 C) (Temporal)   Ht '5\' 1"'$  (1.549 m)   Wt 127 lb 2 oz (57.7 kg)   SpO2 96%   BMI 24.02 kg/m   BP Readings from Last 3 Encounters:  11/06/20 (!) 164/90  11/01/20 106/78  10/27/20 (!) 151/64  180/80 on retesting   CC: hosp f/u visit  Subjective:   HPI: [name] is a 87 y.o. female presenting on 11/06/2020 for Follow-up (Here for 2 wk f/u.  Pt accompanied by daughter, [name]- temp 98.4.)   Recent hospitalization for generalized weakness with falls. Found to have marked transaminitis (AST 600s, ALT 500s, Tbili 2.4, lactate 3.4 and WBC 15k) and gallstone with gallbladder sludge, Ecoli bacteremia thought from GI source, treated with IV ceftriaxone. MRCP revealed choledocholithiasis, flagyl added given concern for ascending cholangitis. Transferred to [facility] hospital to consider further evaluation. She did undergo ERCP which didn't show CBD stone but bile duct sludge s/p sphincterotomy with subsequent improvement in liver enzymes. General surgery discussed indications for cholecystectomy - patient decided against this which was reasonable. Discharged on cefpodoxime (Vantin) 10d course which she has since completed.   [facility] coming out today.  Home palliative care visit scheduled for 11/07/2020.   Covid illness 10/02/2020 s/p Paxlovid treatment.   Osteomyelitis - continues oral doxycycline course x6 wks. Chronic R foot wound has finally healed. Legs looking the best they have in a long time.   Since home, feeling well, today using walker for ambulation, appetite is better. Not using boost/ensure - encouraged to restart this given low albumin levels. Ongoing generalized weakness especially noted in the legs.  Marked BP elevation noted in office. They have not been checking at home.  Family trying to work out being  available 24/7 for patient.  ACEI stopped due to hypotension, BB changed to Toprol XL. She had some afib episodes while hospitalized.    Admit date: 10/25/2020 Discharge date: 10/27/2020 -> transfer to [facility] TCM phone call not performed.   Discharge Diagnoses:  Principal Problem:   Generalized weakness Active Problems:   Essential hypertension   PAD (peripheral artery disease) (HCC)   CAD (coronary artery disease), native coronary artery   CKD (chronic kidney disease) stage 3, GFR 30-59 ml/min (HCC)   Chronic diastolic CHF (congestive heart failure) (HCC)   Osteomyelitis (HCC)   Frequent falls   Transaminitis   Lactic acidosis  Admit date: 10/27/2020 Discharge date: 11/01/2020  Discharge Diagnoses:  Principal Problem:   Choledocholithiasis Active Problems:   HLD (hyperlipidemia)   Essential hypertension   GERD   Pulmonary hypertension, unspecified (HCC)   CAD (coronary artery disease), native coronary artery   CKD (chronic kidney disease) stage 3, GFR 30-59 ml/min (HCC)   Chronic diastolic CHF (congestive heart failure) ([place])   DNR (do not resuscitate)   Osteomyelitis ([place])   COVID-19 virus infection   Bacteremia due to Gram-negative bacteria  Admitted From: home Disposition:  Home with home health Consultants: Gastroenterology General surgery  Procedures: ERCP with sphincterotomy Recommendations for Outpatient Follow-up:  PCP: Follow-up with PCP in 1 to 2 weeks.  Follow-up with surgery as needed.     Relevant past medical, surgical, family and social history reviewed and updated as indicated. Interim medical history since our last visit reviewed. Allergies and medications  reviewed and updated. Outpatient Medications Prior to Visit  Medication Sig Dispense Refill   acetaminophen (TYLENOL) 500 MG tablet Take 500 mg by mouth every 6 (six) hours as needed for mild pain.     acyclovir (ZOVIRAX) 400 MG tablet Take 400 mg by mouth 2 (two) times daily.     Alpha-Lipoic Acid  600 MG TABS Take 1 tablet by mouth daily.     amiodarone (PACERONE) 100 MG tablet Take 100 mg by mouth daily.     apixaban (ELIQUIS) 2.5 MG TABS tablet Take 2.5 mg by mouth 2 (two) times daily.     aspirin EC 81 MG tablet Take 81 mg by mouth daily. Swallow whole.     atorvastatin (LIPITOR) 20 MG tablet Take 20 mg by mouth daily.     cefpodoxime (VANTIN) 200 MG tablet Take 1 tablet (200 mg total) by mouth 2 (two) times daily for 10 days. 20 tablet 0   Docusate Calcium (STOOL SOFTENER PO) Take by mouth daily.     doxycycline (VIBRA-TABS) 100 MG tablet Take 1 tablet (100 mg total) by mouth 2 (two) times daily. 84 tablet 0   DULoxetine (CYMBALTA) 60 MG capsule TAKE 1 CAPSULE BY MOUTH ONCE DAILY 90 capsule 1   gabapentin (NEURONTIN) 400 MG capsule Take 1 capsule (400 mg total) by mouth at bedtime. With second dose during day as needed 150 capsule 1   memantine (NAMENDA) 5 MG tablet TAKE 1 TABLET BY MOUTH TWICE DAILY 60 tablet 6   metoprolol succinate (TOPROL-XL) 25 MG 24 hr tablet Take 1 tablet (25 mg total) by mouth daily. 30 tablet 0   pantoprazole (PROTONIX) 40 MG tablet TAKE 1 TABLET BY MOUTH EVERY DAY 90 tablet 1   polyethylene glycol (MIRALAX / GLYCOLAX) 17 g packet Take 17 g by mouth daily as needed for mild constipation. 14 each 0   prednisoLONE acetate (PRED FORTE) 1 % ophthalmic suspension Place 1 drop into the left eye daily.      rOPINIRole (REQUIP) 1 MG tablet Take 2.5 tablets (2.5 mg total) by mouth at bedtime. 225 tablet 1   saccharomyces boulardii (FLORASTOR) 250 MG capsule Take 1 capsule (250 mg total) by mouth 2 (two) times daily for 14 days. 28 capsule 0   silver sulfADIAZINE (SILVADENE) 1 % cream Apply 1 application topically daily. 50 g 0   tiZANidine (ZANAFLEX) 2 MG tablet Take 2 mg by mouth daily.     Vitamin D, Ergocalciferol, (DRISDOL) 1.25 MG (50000 UNIT) CAPS capsule TAKE 1 CAPSULE BY MOUTH ONCE WEEKLY 12 capsule 3   amiodarone (PACERONE) 200 MG tablet TAKE 1/2 TABLET  ('100MG'$ ) BY MOUTH ONCE DAILY 15 tablet 0   No facility-administered medications prior to visit.     Per HPI unless specifically indicated in ROS section below Review of Systems  Objective:  BP (!) 164/90   Pulse 72   Temp 98.7 F (37.1 C) (Temporal)   Ht '5\' 1"'$  (1.549 m)   Wt 127 lb 2 oz (57.7 kg)   SpO2 96%   BMI 24.02 kg/m   Wt Readings from Last 3 Encounters:  11/06/20 127 lb 2 oz (57.7 kg)  10/25/20 130 lb (59 kg)  09/21/20 132 lb (59.9 kg)      Physical Exam Vitals and nursing note reviewed.  Constitutional:      Appearance: Normal appearance. She is not ill-appearing.     Comments: Ambulates with rollater  Cardiovascular:     Rate and Rhythm: Normal rate and  regular rhythm.     Pulses: Normal pulses.     Heart sounds: Normal heart sounds. No murmur heard.    Comments: Sounds regular Pulmonary:     Effort: Pulmonary effort is normal. No respiratory distress.     Breath sounds: Normal breath sounds. No wheezing, rhonchi or rales.  Musculoskeletal:     Right lower leg: No edema.     Left lower leg: Edema (1+ to ankle) present.     Comments:  Marked improvement in color (purple to pink) to RLE Persistent violaceous hue to LLE  Skin:    Comments: R lateral foot wound has healed over  Neurological:     Mental Status: She is alert.  Psychiatric:        Mood and Affect: Mood normal.        Behavior: Behavior normal.      Lab Results  Component Value Date   CREATININE 0.96 11/01/2020   BUN 11 11/01/2020   NA 139 11/01/2020   K 3.3 (L) 11/01/2020   CL 108 11/01/2020   CO2 25 11/01/2020    Lab Results  Component Value Date   ALT 71 (H) 11/01/2020   AST 23 11/01/2020   ALKPHOS 151 (H) 11/01/2020   BILITOT 0.6 11/01/2020    Lab Results  Component Value Date   WBC 8.0 11/01/2020   HGB 10.4 (L) 11/01/2020   HCT 32.4 (L) 11/01/2020   MCV 102.9 (H) 11/01/2020   PLT 263 11/01/2020    Lab Results  Component Value Date   VITAMINB12 249 06/15/2020     Assessment & Plan:  This visit occurred during the SARS-CoV-2 public health emergency.  Safety protocols were in place, including screening questions prior to the visit, additional usage of staff PPE, and extensive cleaning of exam room while observing appropriate contact time as indicated for disinfecting solutions.   Problem List Items Addressed This Visit     Essential hypertension    Chronic. Marked hypertension noted today, only on Toprol XL '25mg'$  daily. Lisinopril was stopped during hospitalization ?hypotension. I did ask they start monitoring BP at home and call me with readings - if staying persistently high, low threshold to restart ACEI vs ARB       Persistent atrial fibrillation (HCC)    Episodes of afib during hospitalization.  Continue Toprol, renal dose eliquis, aspirin.        Chronic leg pain    This is improved today.        Peripheral neuropathy    Continues gabapentin '400mg'$  nightly       Vitamin B12 deficiency    Not yet due for monthly B12 - will recommend starting oral b12 daily. Check levels next labs.        PAD (peripheral artery disease) (HCC)   CKD (chronic kidney disease) stage 3, GFR 30-59 ml/min (HCC)    Latest GFR 57       Palliative care by specialist    Pending palliative care eval tomorrow       Osteomyelitis ([place])    Continues doxycycline for total 6 wk course - started early 10/2020. Wound has healed significantly well. Seeing podiatry Posey Pronto).        COVID-19 virus infection    Seems to have resolved from this.  Didn't have significant respiratory symptoms       Generalized weakness   Frequent falls   Choledocholithiasis    S/p MRCP then ERCP 10/2020       Bacteremia  due to Gram-negative bacteria    E coli bacteremia presumed from ascending cholangitis s/p treatment with IV abx while hospitalized, then completed outpatient Vantin course.        Malnutrition of moderate degree (HCC)    Weight loss noted. Appetite  slowly picking up. Discussed protein supplement use (boost vs ensure).        Ascending cholangitis - Primary    Blcx grew E coli. Recent hospitalization s/p ERCP with sphincterotomy. Treated with IV abx (rocephin, flagyl) then transitioned to oral Vantin 10d course and symptoms largely resolved with liver function significantly normalizing prior to discharge. They decided against gallbladder surgery given age and comorbidities.          Meds ordered this encounter  Medications   vitamin B-12 (CYANOCOBALAMIN) 1000 MCG tablet    Sig: Take 1 tablet (1,000 mcg total) by mouth daily.    No orders of the defined types were placed in this encounter.   Patient Instructions  You are doing well today - good to see you Blood pressures are staying elevated - start monitoring blood pressures at home and let me know if consistently >150/90 to discuss changing blood pressure medicines. Return as needed or in 3 months for follow up visit.  Follow up plan: Return in about 3 months (around 02/06/2021) for follow up visit.  Ria Bush, MD

## 2020-11-07 ENCOUNTER — Encounter: Payer: Self-pay | Admitting: Family Medicine

## 2020-11-07 ENCOUNTER — Other Ambulatory Visit: Payer: Medicare Other | Admitting: Student

## 2020-11-07 ENCOUNTER — Encounter: Payer: Medicare Other | Admitting: Dermatology

## 2020-11-07 DIAGNOSIS — S90821D Blister (nonthermal), right foot, subsequent encounter: Secondary | ICD-10-CM | POA: Diagnosis not present

## 2020-11-07 DIAGNOSIS — K8050 Calculus of bile duct without cholangitis or cholecystitis without obstruction: Secondary | ICD-10-CM

## 2020-11-07 DIAGNOSIS — E440 Moderate protein-calorie malnutrition: Secondary | ICD-10-CM | POA: Insufficient documentation

## 2020-11-07 DIAGNOSIS — K8309 Other cholangitis: Secondary | ICD-10-CM | POA: Insufficient documentation

## 2020-11-07 DIAGNOSIS — Z515 Encounter for palliative care: Secondary | ICD-10-CM | POA: Diagnosis not present

## 2020-11-07 DIAGNOSIS — R531 Weakness: Secondary | ICD-10-CM | POA: Diagnosis not present

## 2020-11-07 NOTE — Assessment & Plan Note (Signed)
Latest GFR 57

## 2020-11-07 NOTE — Assessment & Plan Note (Signed)
Continues doxycycline for total 6 wk course - started early 10/2020. Wound has healed significantly well. Seeing podiatry Posey Pronto).

## 2020-11-07 NOTE — Assessment & Plan Note (Deleted)
This is improved today.

## 2020-11-07 NOTE — Assessment & Plan Note (Signed)
S/p MRCP then ERCP 10/2020

## 2020-11-07 NOTE — Assessment & Plan Note (Signed)
Seems to have resolved from this.  Didn't have significant respiratory symptoms

## 2020-11-07 NOTE — Assessment & Plan Note (Signed)
E coli bacteremia presumed from ascending cholangitis s/p treatment with IV abx while hospitalized, then completed outpatient Vantin course.

## 2020-11-07 NOTE — Assessment & Plan Note (Signed)
Continues gabapentin '400mg'$  nightly

## 2020-11-07 NOTE — Assessment & Plan Note (Signed)
Not yet due for monthly B12 - will recommend starting oral b12 daily. Check levels next labs.

## 2020-11-07 NOTE — Assessment & Plan Note (Signed)
Episodes of afib during hospitalization.  Continue Toprol, renal dose eliquis, aspirin.

## 2020-11-07 NOTE — Assessment & Plan Note (Addendum)
Pending palliative care eval tomorrow

## 2020-11-07 NOTE — Assessment & Plan Note (Signed)
Chronic. Marked hypertension noted today, only on Toprol XL '25mg'$  daily. Lisinopril was stopped during hospitalization ?hypotension. I did ask they start monitoring BP at home and call me with readings - if staying persistently high, low threshold to restart ACEI vs ARB

## 2020-11-07 NOTE — Assessment & Plan Note (Signed)
This is improved today.

## 2020-11-07 NOTE — Assessment & Plan Note (Addendum)
Blcx grew E coli. Recent hospitalization s/p ERCP with sphincterotomy. Treated with IV abx (rocephin, flagyl) then transitioned to oral Vantin 10d course and symptoms largely resolved with liver function significantly normalizing prior to discharge. They decided against gallbladder surgery given age and comorbidities.

## 2020-11-07 NOTE — Assessment & Plan Note (Signed)
Weight loss noted. Appetite slowly picking up. Discussed protein supplement use (boost vs ensure).

## 2020-11-07 NOTE — Progress Notes (Signed)
[facility] Palliative [facility] Consult Note Telephone: [phone]  Fax: [phone]    Date of encounter: 11/07/20 PATIENT NAME: [name] [address] [place] [address]   [phone] (home)  DOB: 1929-06-29 MRN: [id number] PRIMARY CARE PROVIDER:    Ria Bush, MD,  [place] [address] [phone]  REFERRING PROVIDER:   Ria Bush, MD [address] [place],  [place] [address] [phone]  RESPONSIBLE PARTY:    Contact Information     Name Relation Home Work [place] Daughter [phone]     [name] Daughter [phone]  [phone]   [name] Daughter [phone]  [phone]   [place] Daughter   [phone]        I met face to face with patient and family in the home. Palliative Care was asked to follow this patient by consultation request of  Ria Bush, MD to address advance care planning and complex medical decision making. This is a follow up visit.                                   ASSESSMENT AND PLAN / RECOMMENDATIONS:   Advance Care Planning/Goals of Care: Goals include to maximize quality of life and symptom management.   CODE STATUS: DNR  Symptom Management/Plan:  Generalized weakness- patient with generalized weakness, diastolic HF, recent hospitalization. Requires assistance with ambulation. Recommend PT/ OT as directed. Use walker for ambulation.  Cholelithiasis-patient has declined to have surgery. Continued monitoring.  Wounds-patient with hx of osteomyelitis; right lateral foot wound healing. Continue daily wound care with betadine, cover with dressing. Wound to right shin healing, now being [place]. Continue doxycycline as directed. Follow up with PCP, podiatry as scheduled  Follow up Palliative Care Visit: Palliative care will continue to follow for complex medical decision making, advance care planning, and  clarification of goals. Return in 6-8 weeks or prn.  I spent 40 minutes providing this consultation. More than 50% of the time in this consultation was spent in counseling and care coordination.    PPS: 40%  HOSPICE ELIGIBILITY/DIAGNOSIS: TBD  Chief Complaint: Palliative Medicine visit.   HISTORY OF PRESENT ILLNESS:  [name] is a 87 y.o. year old female  with PVD, chronic ulcer to right foot, osteomyelitis, chronic diastolic heart failure, mild dementia, polyneuropathy, RLS, hypertension, atrial fibrillation, hyperlipidemia, CKD stage 3. Patient recently hospitalized 7/22-7/27/2022 due to choledocholithiasis. She has declined to have surgery.  Patient resides in [employer] apartment. Her daughter [name] has been staying with her recently. Patient states she is doing better. She states her appetite is improving. She denies pain, nausea, vomiting, constipation. Her wounds to right foot have improved. [facility] is to have [facility], PT/OT/ bath aid to come out. She does endorse weakness, occasional shortness of breath with exertion. She uses rollator for ambulation.   Patient is received resting in recliner; pleasant affect. She is non-ill appearing. Wound to right lateral foot is improved from last visit. Two superficial wounds to right shin are healing and will be left open to air.   History obtained from review of EMR, discussion with primary team, and interview with family, facility staff/caregiver and/or [name].  I reviewed available labs, medications, imaging, studies and related documents from the EMR.  Records reviewed and summarized above.   ROS  General: NAD EYES: denies vision changes ENMT: denies dysphagia  Cardiovascular: denies chest pain  Pulmonary: denies cough, occasional SOB w/exertion Abdomen: endorses fair appetite, denies constipation, endorses continence of bowel GU: denies dysuria MSK: weakness Skin: wounds to right foot, right legs Neurological:  denies pain, denies insomnia Psych: Endorses positive mood Heme/lymph/immuno: denies bruises, abnormal bleeding  Physical Exam: Weight: 127.2 pounds Pulse 72, resp 16, b/p 132/77. Sats 95% on room air  Constitutional: NAD General: frail appearing, thin EYES: anicteric sclera, lids intact, no discharge  ENMT: intact hearing, oral mucous membranes moist, dentition intact CV: S1S2, RRR, mild left foot pedal edema Pulmonary: LCTA, no increased work of breathing Abdomen:  normo-active BS + 4 quadrants, soft and non tender GU: deferred MSK: moves all extremities, ambulatory Skin: warm and dry Neuro: generalized weakness, A & O x 3 Psych: non-anxious affect Hem/lymph/immuno: no widespread bruising   Thank you for the opportunity to participate in the care of [name].  The palliative care team will continue to follow. Please call our office at [phone] if we can be of additional assistance.   Ezekiel Slocumb, NP   COVID-19 PATIENT SCREENING TOOL Asked and negative response unless otherwise noted:   Have you had symptoms of covid, tested positive or been in contact with someone with symptoms/positive test in the past 5-10 days? No

## 2020-11-09 ENCOUNTER — Ambulatory Visit: Payer: Medicare Other | Admitting: Podiatry

## 2020-11-09 DIAGNOSIS — I5032 Chronic diastolic (congestive) heart failure: Secondary | ICD-10-CM | POA: Diagnosis not present

## 2020-11-09 DIAGNOSIS — I2510 Atherosclerotic heart disease of native coronary artery without angina pectoris: Secondary | ICD-10-CM | POA: Diagnosis not present

## 2020-11-09 DIAGNOSIS — E538 Deficiency of other specified B group vitamins: Secondary | ICD-10-CM | POA: Diagnosis not present

## 2020-11-09 DIAGNOSIS — G2581 Restless legs syndrome: Secondary | ICD-10-CM | POA: Diagnosis not present

## 2020-11-09 DIAGNOSIS — Z9181 History of falling: Secondary | ICD-10-CM | POA: Diagnosis not present

## 2020-11-09 DIAGNOSIS — I4891 Unspecified atrial fibrillation: Secondary | ICD-10-CM | POA: Diagnosis not present

## 2020-11-09 DIAGNOSIS — M1990 Unspecified osteoarthritis, unspecified site: Secondary | ICD-10-CM | POA: Diagnosis not present

## 2020-11-09 DIAGNOSIS — M5136 Other intervertebral disc degeneration, lumbar region: Secondary | ICD-10-CM | POA: Diagnosis not present

## 2020-11-09 DIAGNOSIS — E7849 Other hyperlipidemia: Secondary | ICD-10-CM | POA: Diagnosis not present

## 2020-11-09 DIAGNOSIS — Z7901 Long term (current) use of anticoagulants: Secondary | ICD-10-CM | POA: Diagnosis not present

## 2020-11-09 DIAGNOSIS — N1831 Chronic kidney disease, stage 3a: Secondary | ICD-10-CM | POA: Diagnosis not present

## 2020-11-09 DIAGNOSIS — I130 Hypertensive heart and chronic kidney disease with heart failure and stage 1 through stage 4 chronic kidney disease, or unspecified chronic kidney disease: Secondary | ICD-10-CM | POA: Diagnosis not present

## 2020-11-09 DIAGNOSIS — I272 Pulmonary hypertension, unspecified: Secondary | ICD-10-CM | POA: Diagnosis not present

## 2020-11-09 DIAGNOSIS — K219 Gastro-esophageal reflux disease without esophagitis: Secondary | ICD-10-CM | POA: Diagnosis not present

## 2020-11-09 DIAGNOSIS — I872 Venous insufficiency (chronic) (peripheral): Secondary | ICD-10-CM | POA: Diagnosis not present

## 2020-11-09 DIAGNOSIS — G9009 Other idiopathic peripheral autonomic neuropathy: Secondary | ICD-10-CM | POA: Diagnosis not present

## 2020-11-09 DIAGNOSIS — Z7982 Long term (current) use of aspirin: Secondary | ICD-10-CM | POA: Diagnosis not present

## 2020-11-09 DIAGNOSIS — M810 Age-related osteoporosis without current pathological fracture: Secondary | ICD-10-CM | POA: Diagnosis not present

## 2020-11-10 ENCOUNTER — Telehealth: Payer: Self-pay

## 2020-11-10 MED ORDER — LISINOPRIL 10 MG PO TABS: 10.0000 mg | ORAL_TABLET | Freq: Every day | ORAL | 3 refills | Status: DC

## 2020-11-10 NOTE — Telephone Encounter (Signed)
Spoke [name] relaying Dr. Synthia Innocent message.  She verbalizes understanding.

## 2020-11-10 NOTE — Telephone Encounter (Signed)
Patient's daughter [name] called back and was advised as instructed.and she verbalized understanding.  [name] stated that she will start her mom back on the Lisinopril today. Saralyn Pilar to keep Dr. Danise Mina updated on her BP and pulse readings. [name] stated that she was really concerned with the high blood pressure reading that she got this morning. Patient's daughter was given ER precautions and she verbalized understanding.

## 2020-11-10 NOTE — Telephone Encounter (Signed)
Recommend restart lisinopril '10mg'$  daily - new Rx sent to pharmacy.  She was previously on this, but it was stopped during latest hospitalization due to low BPs.  Keep [place] updated with how blood pressures run. Also would like them to check pulse occasionally.

## 2020-11-10 NOTE — Telephone Encounter (Signed)
[name] (DPR signed) that pt was recently hospitalized and d/c from hospital with a visit with DR G on 11/06/20. [name] was to cb with BP readings for pt. 11/08/20 BP 150/78  and 164/77 11/09/20 BP 158/78 11/10/20 BP 201/88 before taking med 11/10/20 BP 217/88 one hr after taking med Pt is presently taking metoprolol XL 25 mg one daily. [name] request cb after reviewed by DR G to know what to do.

## 2020-11-10 NOTE — Telephone Encounter (Signed)
Plz have her check BP after starting lisinopril to ensure coming down.

## 2020-11-14 ENCOUNTER — Ambulatory Visit: Payer: Medicare Other | Admitting: Podiatry

## 2020-11-14 DIAGNOSIS — Z7901 Long term (current) use of anticoagulants: Secondary | ICD-10-CM | POA: Diagnosis not present

## 2020-11-14 DIAGNOSIS — I2510 Atherosclerotic heart disease of native coronary artery without angina pectoris: Secondary | ICD-10-CM | POA: Diagnosis not present

## 2020-11-14 DIAGNOSIS — G9009 Other idiopathic peripheral autonomic neuropathy: Secondary | ICD-10-CM | POA: Diagnosis not present

## 2020-11-14 DIAGNOSIS — M5136 Other intervertebral disc degeneration, lumbar region: Secondary | ICD-10-CM | POA: Diagnosis not present

## 2020-11-14 DIAGNOSIS — I5032 Chronic diastolic (congestive) heart failure: Secondary | ICD-10-CM | POA: Diagnosis not present

## 2020-11-14 DIAGNOSIS — N1831 Chronic kidney disease, stage 3a: Secondary | ICD-10-CM | POA: Diagnosis not present

## 2020-11-14 DIAGNOSIS — G2581 Restless legs syndrome: Secondary | ICD-10-CM | POA: Diagnosis not present

## 2020-11-14 DIAGNOSIS — I272 Pulmonary hypertension, unspecified: Secondary | ICD-10-CM | POA: Diagnosis not present

## 2020-11-14 DIAGNOSIS — I872 Venous insufficiency (chronic) (peripheral): Secondary | ICD-10-CM | POA: Diagnosis not present

## 2020-11-14 DIAGNOSIS — I130 Hypertensive heart and chronic kidney disease with heart failure and stage 1 through stage 4 chronic kidney disease, or unspecified chronic kidney disease: Secondary | ICD-10-CM | POA: Diagnosis not present

## 2020-11-14 DIAGNOSIS — Z7982 Long term (current) use of aspirin: Secondary | ICD-10-CM | POA: Diagnosis not present

## 2020-11-14 DIAGNOSIS — Z9181 History of falling: Secondary | ICD-10-CM | POA: Diagnosis not present

## 2020-11-14 DIAGNOSIS — R1310 Dysphagia, unspecified: Secondary | ICD-10-CM | POA: Diagnosis not present

## 2020-11-14 DIAGNOSIS — M1990 Unspecified osteoarthritis, unspecified site: Secondary | ICD-10-CM | POA: Diagnosis not present

## 2020-11-14 DIAGNOSIS — I4891 Unspecified atrial fibrillation: Secondary | ICD-10-CM | POA: Diagnosis not present

## 2020-11-14 DIAGNOSIS — K219 Gastro-esophageal reflux disease without esophagitis: Secondary | ICD-10-CM | POA: Diagnosis not present

## 2020-11-14 DIAGNOSIS — E538 Deficiency of other specified B group vitamins: Secondary | ICD-10-CM | POA: Diagnosis not present

## 2020-11-14 DIAGNOSIS — E7849 Other hyperlipidemia: Secondary | ICD-10-CM | POA: Diagnosis not present

## 2020-11-14 DIAGNOSIS — M810 Age-related osteoporosis without current pathological fracture: Secondary | ICD-10-CM | POA: Diagnosis not present

## 2020-11-15 ENCOUNTER — Telehealth: Payer: Self-pay | Admitting: *Deleted

## 2020-11-15 DIAGNOSIS — M1990 Unspecified osteoarthritis, unspecified site: Secondary | ICD-10-CM | POA: Diagnosis not present

## 2020-11-15 DIAGNOSIS — M810 Age-related osteoporosis without current pathological fracture: Secondary | ICD-10-CM | POA: Diagnosis not present

## 2020-11-15 DIAGNOSIS — G9009 Other idiopathic peripheral autonomic neuropathy: Secondary | ICD-10-CM | POA: Diagnosis not present

## 2020-11-15 DIAGNOSIS — I272 Pulmonary hypertension, unspecified: Secondary | ICD-10-CM | POA: Diagnosis not present

## 2020-11-15 DIAGNOSIS — Z9181 History of falling: Secondary | ICD-10-CM | POA: Diagnosis not present

## 2020-11-15 DIAGNOSIS — I130 Hypertensive heart and chronic kidney disease with heart failure and stage 1 through stage 4 chronic kidney disease, or unspecified chronic kidney disease: Secondary | ICD-10-CM | POA: Diagnosis not present

## 2020-11-15 DIAGNOSIS — E7849 Other hyperlipidemia: Secondary | ICD-10-CM | POA: Diagnosis not present

## 2020-11-15 DIAGNOSIS — Z7982 Long term (current) use of aspirin: Secondary | ICD-10-CM | POA: Diagnosis not present

## 2020-11-15 DIAGNOSIS — G2581 Restless legs syndrome: Secondary | ICD-10-CM | POA: Diagnosis not present

## 2020-11-15 DIAGNOSIS — K219 Gastro-esophageal reflux disease without esophagitis: Secondary | ICD-10-CM | POA: Diagnosis not present

## 2020-11-15 DIAGNOSIS — R1310 Dysphagia, unspecified: Secondary | ICD-10-CM | POA: Diagnosis not present

## 2020-11-15 DIAGNOSIS — I872 Venous insufficiency (chronic) (peripheral): Secondary | ICD-10-CM | POA: Diagnosis not present

## 2020-11-15 DIAGNOSIS — I5032 Chronic diastolic (congestive) heart failure: Secondary | ICD-10-CM | POA: Diagnosis not present

## 2020-11-15 DIAGNOSIS — Z7901 Long term (current) use of anticoagulants: Secondary | ICD-10-CM | POA: Diagnosis not present

## 2020-11-15 DIAGNOSIS — M5136 Other intervertebral disc degeneration, lumbar region: Secondary | ICD-10-CM | POA: Diagnosis not present

## 2020-11-15 DIAGNOSIS — I2510 Atherosclerotic heart disease of native coronary artery without angina pectoris: Secondary | ICD-10-CM | POA: Diagnosis not present

## 2020-11-15 DIAGNOSIS — N1831 Chronic kidney disease, stage 3a: Secondary | ICD-10-CM | POA: Diagnosis not present

## 2020-11-15 DIAGNOSIS — E538 Deficiency of other specified B group vitamins: Secondary | ICD-10-CM | POA: Diagnosis not present

## 2020-11-15 DIAGNOSIS — I4891 Unspecified atrial fibrillation: Secondary | ICD-10-CM | POA: Diagnosis not present

## 2020-11-15 NOTE — Telephone Encounter (Signed)
Does she need new referral for ST? What's reason for ST?  Ok to do both.

## 2020-11-15 NOTE — Telephone Encounter (Signed)
Stephanie/OT/[facility] left a voicemail stating that she would like verbal orders for OT once a week for 5 weeks. Colletta [place] also stated that she would like an order for a speech therapy consult.

## 2020-11-16 ENCOUNTER — Ambulatory Visit: Payer: Medicare Other | Admitting: Dermatology

## 2020-11-16 ENCOUNTER — Other Ambulatory Visit: Payer: Self-pay

## 2020-11-16 ENCOUNTER — Ambulatory Visit: Payer: Medicare Other | Admitting: Podiatry

## 2020-11-16 ENCOUNTER — Encounter: Payer: Self-pay | Admitting: Podiatry

## 2020-11-16 DIAGNOSIS — I739 Peripheral vascular disease, unspecified: Secondary | ICD-10-CM

## 2020-11-16 DIAGNOSIS — S90821A Blister (nonthermal), right foot, initial encounter: Secondary | ICD-10-CM | POA: Diagnosis not present

## 2020-11-16 NOTE — Progress Notes (Signed)
Subjective:  Patient ID: [name], female    DOB: [birth year],  MRN: [id number]  Chief Complaint  Patient presents with   Wound Check    "It's all healed up."    87 y.o. female presents with the above complaint.  Patient presents with follow-up to right lateral fifth metatarsal blister.  She states this has completely healed and she is doing well.  No acute complaints.  She states and denies any acute issues.   Review of Systems: Negative except as noted in the HPI. Denies N/V/F/Ch.  Past Medical History:  Diagnosis Date   Abdominal aortic atherosclerosis ([place]) 09/2015   by xray   Anxiety    BCC (basal cell carcinoma of skin) 05/2011   on nose   BCC (basal cell carcinoma of skin) 08/09/2020   upper chest   Breast lesion    a. diagnosed with complex sclerosing lesion with calcifications of the right breast in 08/2016   Chest pain    a. nuclear stress test 03/2012: normal; b. 09/2018 MV: EF >65%, no isch/scar.    DDD (degenerative disc disease), lumbosacral 12/06/1992   Gallstones 09/2015   incidentally by xray   GERD (gastroesophageal reflux disease) 05/1999   HERPES ZOSTER 04/13/2007   Qualifier: Diagnosis of  By: [name], [name]    Hiatal hernia    History of shingles    ophthalmic, takes acyclovir daily   Hyperlipidemia 12/2003   Hypertension 1990   Mitral regurgitation    a. echo 03/2012: EF 60-65%, no RWMA, mild to mod AI/MR, mod TR, PASP 37 mmHg   Osteoarthritis 08/21/1989   Osteoporosis with fracture 11/1997   with compression fracture T12   Persistent atrial fibrillation ([place]) 03/16/2012   a. CHADS2VASc => 5 (HTN, age x 2, vascular disease, sex category)-->Eliquis 5 BID;  b. Recurrent AF in 06/2016;  c. 01/2017 s/p DCCV;  d. 02/2017 recurrent AFib->Amio added->DCCV; e. 03/2017 recurrent AFib, s/p reload of amio and DCCV 04/02/2017   POSTHERPETIC NEURALGIA 04/20/2007   Qualifier: Diagnosis of  By: [name], Billie-Lynn Daniels    Rheumatoid arthritis  (HCC)    RLS (restless legs syndrome)     Current Outpatient Medications:    acetaminophen (TYLENOL) 500 MG tablet, Take 500 mg by mouth every 6 (six) hours as needed for mild pain., Disp: , Rfl:    acyclovir (ZOVIRAX) 400 MG tablet, Take 400 mg by mouth 2 (two) times daily., Disp: , Rfl:    Alpha-Lipoic Acid 600 MG TABS, Take 1 tablet by mouth daily., Disp: , Rfl:    amiodarone (PACERONE) 100 MG tablet, Take 100 mg by mouth daily., Disp: , Rfl:    apixaban (ELIQUIS) 2.5 MG TABS tablet, TAKE 1 TABLET BY MOUTH TWICE DAILY, Disp: 180 tablet, Rfl: 1   aspirin EC 81 MG tablet, Take 81 mg by mouth daily. Swallow whole., Disp: , Rfl:    atorvastatin (LIPITOR) 20 MG tablet, Take 20 mg by mouth daily., Disp: , Rfl:    Docusate Calcium (STOOL SOFTENER PO), Take by mouth daily., Disp: , Rfl:    doxycycline (VIBRA-TABS) 100 MG tablet, Take 1 tablet (100 mg total) by mouth 2 (two) times daily., Disp: 84 tablet, Rfl: 0   DULoxetine (CYMBALTA) 60 MG capsule, TAKE 1 CAPSULE BY MOUTH ONCE DAILY, Disp: 90 capsule, Rfl: 0   gabapentin (NEURONTIN) 400 MG capsule, Take 1 capsule (400 mg total) by mouth at bedtime. With second dose during day as needed, Disp: 150 capsule, Rfl: 1  lisinopril (ZESTRIL) 10 MG tablet, Take 1 tablet (10 mg total) by mouth daily., Disp: 90 tablet, Rfl: 3   memantine (NAMENDA) 5 MG tablet, TAKE 1 TABLET BY MOUTH TWICE DAILY, Disp: 60 tablet, Rfl: 6   metoprolol succinate (TOPROL-XL) 25 MG 24 hr tablet, Take 1 tablet (25 mg total) by mouth daily., Disp: 30 tablet, Rfl: 0   pantoprazole (PROTONIX) 40 MG tablet, TAKE 1 TABLET BY MOUTH ONCE DAILY, Disp: 90 tablet, Rfl: 0   polyethylene glycol (MIRALAX / GLYCOLAX) 17 g packet, Take 17 g by mouth daily as needed for mild constipation., Disp: 14 each, Rfl: 0   prednisoLONE acetate (PRED FORTE) 1 % ophthalmic suspension, Place 1 drop into the left eye daily. , Disp: , Rfl:    rOPINIRole (REQUIP) 1 MG tablet, Take 2.5 tablets (2.5 mg total) by  mouth at bedtime., Disp: 225 tablet, Rfl: 1   silver sulfADIAZINE (SILVADENE) 1 % cream, Apply 1 application topically daily., Disp: 50 g, Rfl: 0   tiZANidine (ZANAFLEX) 2 MG tablet, Take 2 mg by mouth daily., Disp: , Rfl:    vitamin B-12 (CYANOCOBALAMIN) 1000 MCG tablet, Take 1 tablet (1,000 mcg total) by mouth daily., Disp: , Rfl:    Vitamin D, Ergocalciferol, (DRISDOL) 1.25 MG (50000 UNIT) CAPS capsule, TAKE 1 CAPSULE BY MOUTH ONCE WEEKLY, Disp: 12 capsule, Rfl: 3  Social History   Tobacco Use  Smoking Status Former   Packs/day: 0.25   Years: 1.00   Pack years: 0.25   Types: Cigarettes   Quit date: 04/08/1976   Years since quitting: 44.6  Smokeless Tobacco Never  Tobacco Comments   pt smokes occasionally "socially"    Allergies  Allergen Reactions   Penicillins Rash and Other (See Comments)    Childhood Allergy Has patient had a PCN reaction causing immediate rash, facial/tongue/throat swelling, SOB or lightheadedness with hypotension: Yes Has patient had a PCN reaction causing severe rash involving mucus membranes or skin necrosis: Unknown Has patient had a PCN reaction that required hospitalization: No Has patient had a PCN reaction occurring within the last 10 years: No If all of the above answers are "NO", then may proceed with Cephalosporin use.    Objective:  There were no vitals filed for this visit. There is no height or weight on file to calculate BMI. Constitutional Well developed. Well nourished.  Vascular Dorsalis pedis pulses palpable bilaterally. Posterior tibial pulses palpable bilaterally. Capillary refill normal to all digits.  No cyanosis or clubbing noted. Pedal hair growth normal.  Neurologic Normal speech. Oriented to person, place, and time. Epicritic sensation to light touch grossly present bilaterally.  Dermatologic No further blood blister/friction blister was drained to the right lateral side.  No purulent drainage noted.  No granular wound  noted underneath it.  Now maceration present.  No malodor present.  Orthopedic: Normal joint ROM without pain or crepitus bilaterally. No visible deformities. No bony tenderness.   Radiographs: None Assessment:   1. Peripheral vascular disease ([place])   2. Friction blister of the foot, right, initial encounter     Plan:  Patient was evaluated and treated and all questions answered.  Right lateral fifth metatarsal blood blister/friction blister -I clinically healed.  At this time patient can stop doing dressing changes.  She brought up the idea of continuing doxycycline per her primary care physician.  I discussed with her that it is completely fine with me to keep her on doxycycline for a little bit longer for bone infection.  No follow-ups on file.

## 2020-11-16 NOTE — Telephone Encounter (Signed)
[name] called in and stated that technician  levels have decreased and is needed

## 2020-11-16 NOTE — Telephone Encounter (Signed)
Lvm asking Colletta Maryland to call back.  Need to get answer to Dr. Synthia Innocent questions and inform her Dr. Darnell Level is giving verbal orders for both OT and ST.

## 2020-11-17 DIAGNOSIS — N1831 Chronic kidney disease, stage 3a: Secondary | ICD-10-CM | POA: Diagnosis not present

## 2020-11-17 DIAGNOSIS — Z7982 Long term (current) use of aspirin: Secondary | ICD-10-CM | POA: Diagnosis not present

## 2020-11-17 DIAGNOSIS — I872 Venous insufficiency (chronic) (peripheral): Secondary | ICD-10-CM | POA: Diagnosis not present

## 2020-11-17 DIAGNOSIS — M1990 Unspecified osteoarthritis, unspecified site: Secondary | ICD-10-CM | POA: Diagnosis not present

## 2020-11-17 DIAGNOSIS — G9009 Other idiopathic peripheral autonomic neuropathy: Secondary | ICD-10-CM | POA: Diagnosis not present

## 2020-11-17 DIAGNOSIS — I2510 Atherosclerotic heart disease of native coronary artery without angina pectoris: Secondary | ICD-10-CM | POA: Diagnosis not present

## 2020-11-17 DIAGNOSIS — E538 Deficiency of other specified B group vitamins: Secondary | ICD-10-CM | POA: Diagnosis not present

## 2020-11-17 DIAGNOSIS — Z7901 Long term (current) use of anticoagulants: Secondary | ICD-10-CM | POA: Diagnosis not present

## 2020-11-17 DIAGNOSIS — I5032 Chronic diastolic (congestive) heart failure: Secondary | ICD-10-CM | POA: Diagnosis not present

## 2020-11-17 DIAGNOSIS — I272 Pulmonary hypertension, unspecified: Secondary | ICD-10-CM | POA: Diagnosis not present

## 2020-11-17 DIAGNOSIS — M810 Age-related osteoporosis without current pathological fracture: Secondary | ICD-10-CM | POA: Diagnosis not present

## 2020-11-17 DIAGNOSIS — K219 Gastro-esophageal reflux disease without esophagitis: Secondary | ICD-10-CM | POA: Diagnosis not present

## 2020-11-17 DIAGNOSIS — R1310 Dysphagia, unspecified: Secondary | ICD-10-CM | POA: Diagnosis not present

## 2020-11-17 DIAGNOSIS — I130 Hypertensive heart and chronic kidney disease with heart failure and stage 1 through stage 4 chronic kidney disease, or unspecified chronic kidney disease: Secondary | ICD-10-CM | POA: Diagnosis not present

## 2020-11-17 DIAGNOSIS — Z9181 History of falling: Secondary | ICD-10-CM | POA: Diagnosis not present

## 2020-11-17 DIAGNOSIS — I4891 Unspecified atrial fibrillation: Secondary | ICD-10-CM | POA: Diagnosis not present

## 2020-11-17 DIAGNOSIS — E7849 Other hyperlipidemia: Secondary | ICD-10-CM | POA: Diagnosis not present

## 2020-11-17 DIAGNOSIS — M5136 Other intervertebral disc degeneration, lumbar region: Secondary | ICD-10-CM | POA: Diagnosis not present

## 2020-11-17 DIAGNOSIS — G2581 Restless legs syndrome: Secondary | ICD-10-CM | POA: Diagnosis not present

## 2020-11-17 NOTE — Telephone Encounter (Signed)
[name] can you call [name] and clarify above message?

## 2020-11-17 NOTE — Telephone Encounter (Signed)
Ok. Do they need new referral or can they accept verbal order?

## 2020-11-17 NOTE — Telephone Encounter (Signed)
Spoke with Colletta Maryland to clarify reason for ST.  States dementia is progressing rapidly and they want to work with pt to keep her speech and mind as active and sharp as they can.

## 2020-11-17 NOTE — Telephone Encounter (Signed)
This morning she said she got the verbal orders yesterday for both services.  She didn't mention a needing anything else.

## 2020-11-20 ENCOUNTER — Other Ambulatory Visit: Payer: Self-pay | Admitting: Internal Medicine

## 2020-11-20 ENCOUNTER — Other Ambulatory Visit: Payer: Self-pay | Admitting: Cardiovascular Disease

## 2020-11-20 ENCOUNTER — Other Ambulatory Visit: Payer: Self-pay | Admitting: Family Medicine

## 2020-11-20 DIAGNOSIS — I130 Hypertensive heart and chronic kidney disease with heart failure and stage 1 through stage 4 chronic kidney disease, or unspecified chronic kidney disease: Secondary | ICD-10-CM | POA: Diagnosis not present

## 2020-11-20 DIAGNOSIS — I4891 Unspecified atrial fibrillation: Secondary | ICD-10-CM | POA: Diagnosis not present

## 2020-11-20 DIAGNOSIS — Z9181 History of falling: Secondary | ICD-10-CM | POA: Diagnosis not present

## 2020-11-20 DIAGNOSIS — M62830 Muscle spasm of back: Secondary | ICD-10-CM | POA: Diagnosis not present

## 2020-11-20 DIAGNOSIS — R1310 Dysphagia, unspecified: Secondary | ICD-10-CM | POA: Diagnosis not present

## 2020-11-20 DIAGNOSIS — M1990 Unspecified osteoarthritis, unspecified site: Secondary | ICD-10-CM | POA: Diagnosis not present

## 2020-11-20 DIAGNOSIS — M5416 Radiculopathy, lumbar region: Secondary | ICD-10-CM | POA: Diagnosis not present

## 2020-11-20 DIAGNOSIS — E538 Deficiency of other specified B group vitamins: Secondary | ICD-10-CM | POA: Diagnosis not present

## 2020-11-20 DIAGNOSIS — Z7982 Long term (current) use of aspirin: Secondary | ICD-10-CM | POA: Diagnosis not present

## 2020-11-20 DIAGNOSIS — G9009 Other idiopathic peripheral autonomic neuropathy: Secondary | ICD-10-CM | POA: Diagnosis not present

## 2020-11-20 DIAGNOSIS — G2581 Restless legs syndrome: Secondary | ICD-10-CM | POA: Diagnosis not present

## 2020-11-20 DIAGNOSIS — K219 Gastro-esophageal reflux disease without esophagitis: Secondary | ICD-10-CM | POA: Diagnosis not present

## 2020-11-20 DIAGNOSIS — I872 Venous insufficiency (chronic) (peripheral): Secondary | ICD-10-CM | POA: Diagnosis not present

## 2020-11-20 DIAGNOSIS — E7849 Other hyperlipidemia: Secondary | ICD-10-CM | POA: Diagnosis not present

## 2020-11-20 DIAGNOSIS — I2510 Atherosclerotic heart disease of native coronary artery without angina pectoris: Secondary | ICD-10-CM | POA: Diagnosis not present

## 2020-11-20 DIAGNOSIS — M5136 Other intervertebral disc degeneration, lumbar region: Secondary | ICD-10-CM | POA: Diagnosis not present

## 2020-11-20 DIAGNOSIS — I272 Pulmonary hypertension, unspecified: Secondary | ICD-10-CM | POA: Diagnosis not present

## 2020-11-20 DIAGNOSIS — Z7901 Long term (current) use of anticoagulants: Secondary | ICD-10-CM | POA: Diagnosis not present

## 2020-11-20 DIAGNOSIS — I5032 Chronic diastolic (congestive) heart failure: Secondary | ICD-10-CM | POA: Diagnosis not present

## 2020-11-20 DIAGNOSIS — M48062 Spinal stenosis, lumbar region with neurogenic claudication: Secondary | ICD-10-CM | POA: Diagnosis not present

## 2020-11-20 DIAGNOSIS — N1831 Chronic kidney disease, stage 3a: Secondary | ICD-10-CM | POA: Diagnosis not present

## 2020-11-20 DIAGNOSIS — M810 Age-related osteoporosis without current pathological fracture: Secondary | ICD-10-CM | POA: Diagnosis not present

## 2020-11-20 NOTE — Telephone Encounter (Signed)
Refill request

## 2020-11-20 NOTE — Telephone Encounter (Signed)
LOV 11/06/20 Follow up visit No future appointments.  Last filled: Requip 03/30/21 #225 with 1 refill                  Namenda 04/07/21 #60 with 6 refills

## 2020-11-20 NOTE — Telephone Encounter (Signed)
Prescription refill request for Eliquis received. Indication:afib Last office visit:visser 06/12/20 Scr: 0.96 11/01/20 Age: 25fWeight:57.7kg

## 2020-11-21 NOTE — Telephone Encounter (Signed)
Patient's daughter [name] left a voicemail stating that [employer] sent over a request for refills yesterday and has not heard anything back. [name] stated that they also need a refill on Pantoprazole and Doxycycline. Last refill Doxycycline last refill 10/10/20 #84

## 2020-11-21 NOTE — Telephone Encounter (Addendum)
Pantoprazole was refilled yesterday.  Namenda, requip refilled today.  Plan was doxycycline x 6 wks - she is completing this course.  How is foot? If fully recovered, may not need further abx.  Would suggest OV for evaluation if they want to continue doxycycline abx.

## 2020-11-22 ENCOUNTER — Telehealth: Payer: Self-pay

## 2020-11-22 ENCOUNTER — Telehealth: Payer: Self-pay | Admitting: *Deleted

## 2020-11-22 DIAGNOSIS — M1990 Unspecified osteoarthritis, unspecified site: Secondary | ICD-10-CM | POA: Diagnosis not present

## 2020-11-22 DIAGNOSIS — E7849 Other hyperlipidemia: Secondary | ICD-10-CM | POA: Diagnosis not present

## 2020-11-22 DIAGNOSIS — I872 Venous insufficiency (chronic) (peripheral): Secondary | ICD-10-CM | POA: Diagnosis not present

## 2020-11-22 DIAGNOSIS — K219 Gastro-esophageal reflux disease without esophagitis: Secondary | ICD-10-CM | POA: Diagnosis not present

## 2020-11-22 DIAGNOSIS — E538 Deficiency of other specified B group vitamins: Secondary | ICD-10-CM | POA: Diagnosis not present

## 2020-11-22 DIAGNOSIS — Z7982 Long term (current) use of aspirin: Secondary | ICD-10-CM | POA: Diagnosis not present

## 2020-11-22 DIAGNOSIS — M810 Age-related osteoporosis without current pathological fracture: Secondary | ICD-10-CM | POA: Diagnosis not present

## 2020-11-22 DIAGNOSIS — M5136 Other intervertebral disc degeneration, lumbar region: Secondary | ICD-10-CM | POA: Diagnosis not present

## 2020-11-22 DIAGNOSIS — Z7901 Long term (current) use of anticoagulants: Secondary | ICD-10-CM | POA: Diagnosis not present

## 2020-11-22 DIAGNOSIS — Z9181 History of falling: Secondary | ICD-10-CM | POA: Diagnosis not present

## 2020-11-22 DIAGNOSIS — I272 Pulmonary hypertension, unspecified: Secondary | ICD-10-CM | POA: Diagnosis not present

## 2020-11-22 DIAGNOSIS — I4891 Unspecified atrial fibrillation: Secondary | ICD-10-CM | POA: Diagnosis not present

## 2020-11-22 DIAGNOSIS — N1831 Chronic kidney disease, stage 3a: Secondary | ICD-10-CM | POA: Diagnosis not present

## 2020-11-22 DIAGNOSIS — I5032 Chronic diastolic (congestive) heart failure: Secondary | ICD-10-CM | POA: Diagnosis not present

## 2020-11-22 DIAGNOSIS — R1310 Dysphagia, unspecified: Secondary | ICD-10-CM | POA: Diagnosis not present

## 2020-11-22 DIAGNOSIS — I130 Hypertensive heart and chronic kidney disease with heart failure and stage 1 through stage 4 chronic kidney disease, or unspecified chronic kidney disease: Secondary | ICD-10-CM | POA: Diagnosis not present

## 2020-11-22 DIAGNOSIS — I2510 Atherosclerotic heart disease of native coronary artery without angina pectoris: Secondary | ICD-10-CM | POA: Diagnosis not present

## 2020-11-22 DIAGNOSIS — G9009 Other idiopathic peripheral autonomic neuropathy: Secondary | ICD-10-CM | POA: Diagnosis not present

## 2020-11-22 DIAGNOSIS — G2581 Restless legs syndrome: Secondary | ICD-10-CM | POA: Diagnosis not present

## 2020-11-22 MED ORDER — METOPROLOL SUCCINATE ER 50 MG PO TB24: 50.0000 mg | ORAL_TABLET | Freq: Every day | ORAL | 6 refills | Status: DC

## 2020-11-22 NOTE — Telephone Encounter (Signed)
[name], patient's daughter, dropped off a letter with B/P readings and notes for Dr Darnell Level to review and respond. I placed this in Dr Synthia Innocent inbox for review.

## 2020-11-22 NOTE — Telephone Encounter (Signed)
Kenney Houseman OT with [facility] called stating that she got a couple of elevated blood pressure readings on patient this am. Kenney Houseman stated that these reading were with her being seated at 10:12 am 170/84 and 10:25 am 164/82. Kenney Houseman stated that patient had all of her am medications prior to the readings. Kenney Houseman stated that patient did not have any symptoms and told her that she felt just fine.

## 2020-11-22 NOTE — Telephone Encounter (Signed)
I also reviewed letter from patient with log showing BP ranging 130-170s/70-100s, HR 100s. Highest 8/10 to 200/110, HR 115.  BP Readings from Last 3 Encounters:  11/06/20 (!) 164/90  11/01/20 106/78  10/27/20 (!) 151/64    Pulse Readings from Last 3 Encounters:  11/06/20 72  11/01/20 (!) 108  10/27/20 75     Recommend we increase metoprolol to '50mg'$  daily. May take 2 of current dose, new Rx will be at pharmacy for metoprolol succinate '50mg'$  daily.   Keep [place] updated with BP readings.   Recommend OV to review need for antibiotic.

## 2020-11-22 NOTE — Telephone Encounter (Signed)
Spoke with pt's daughter, [name] (on dpr), relaying Dr. Synthia Innocent message.  Verbalizes understanding.  States pt's foot is improving.  Seen by podiatry and they also think it's a good idea to continue abx.

## 2020-11-22 NOTE — Telephone Encounter (Signed)
See other phone note

## 2020-11-23 NOTE — Telephone Encounter (Signed)
Spoke with pt's daughter, [name] (on dpr), relaying Dr. Synthia Innocent message.  Verbalizes understanding but will have to call back to schedule OV to discuss ongoing abx tx for pt.

## 2020-11-24 DIAGNOSIS — N1831 Chronic kidney disease, stage 3a: Secondary | ICD-10-CM | POA: Diagnosis not present

## 2020-11-24 DIAGNOSIS — I130 Hypertensive heart and chronic kidney disease with heart failure and stage 1 through stage 4 chronic kidney disease, or unspecified chronic kidney disease: Secondary | ICD-10-CM | POA: Diagnosis not present

## 2020-11-24 DIAGNOSIS — Z9181 History of falling: Secondary | ICD-10-CM | POA: Diagnosis not present

## 2020-11-24 DIAGNOSIS — I4891 Unspecified atrial fibrillation: Secondary | ICD-10-CM | POA: Diagnosis not present

## 2020-11-24 DIAGNOSIS — K219 Gastro-esophageal reflux disease without esophagitis: Secondary | ICD-10-CM | POA: Diagnosis not present

## 2020-11-24 DIAGNOSIS — M5136 Other intervertebral disc degeneration, lumbar region: Secondary | ICD-10-CM | POA: Diagnosis not present

## 2020-11-24 DIAGNOSIS — G9009 Other idiopathic peripheral autonomic neuropathy: Secondary | ICD-10-CM | POA: Diagnosis not present

## 2020-11-24 DIAGNOSIS — I2510 Atherosclerotic heart disease of native coronary artery without angina pectoris: Secondary | ICD-10-CM | POA: Diagnosis not present

## 2020-11-24 DIAGNOSIS — Z7901 Long term (current) use of anticoagulants: Secondary | ICD-10-CM | POA: Diagnosis not present

## 2020-11-24 DIAGNOSIS — E7849 Other hyperlipidemia: Secondary | ICD-10-CM | POA: Diagnosis not present

## 2020-11-24 DIAGNOSIS — Z7982 Long term (current) use of aspirin: Secondary | ICD-10-CM | POA: Diagnosis not present

## 2020-11-24 DIAGNOSIS — M810 Age-related osteoporosis without current pathological fracture: Secondary | ICD-10-CM | POA: Diagnosis not present

## 2020-11-24 DIAGNOSIS — I5032 Chronic diastolic (congestive) heart failure: Secondary | ICD-10-CM | POA: Diagnosis not present

## 2020-11-24 DIAGNOSIS — I872 Venous insufficiency (chronic) (peripheral): Secondary | ICD-10-CM | POA: Diagnosis not present

## 2020-11-24 DIAGNOSIS — R1310 Dysphagia, unspecified: Secondary | ICD-10-CM | POA: Diagnosis not present

## 2020-11-24 DIAGNOSIS — E538 Deficiency of other specified B group vitamins: Secondary | ICD-10-CM | POA: Diagnosis not present

## 2020-11-24 DIAGNOSIS — I272 Pulmonary hypertension, unspecified: Secondary | ICD-10-CM | POA: Diagnosis not present

## 2020-11-24 DIAGNOSIS — M1990 Unspecified osteoarthritis, unspecified site: Secondary | ICD-10-CM | POA: Diagnosis not present

## 2020-11-24 DIAGNOSIS — G2581 Restless legs syndrome: Secondary | ICD-10-CM | POA: Diagnosis not present

## 2020-11-27 ENCOUNTER — Encounter: Payer: Self-pay | Admitting: Family Medicine

## 2020-11-27 ENCOUNTER — Other Ambulatory Visit: Payer: Self-pay | Admitting: Family Medicine

## 2020-11-27 DIAGNOSIS — E7849 Other hyperlipidemia: Secondary | ICD-10-CM | POA: Diagnosis not present

## 2020-11-27 DIAGNOSIS — I4891 Unspecified atrial fibrillation: Secondary | ICD-10-CM | POA: Diagnosis not present

## 2020-11-27 DIAGNOSIS — G2581 Restless legs syndrome: Secondary | ICD-10-CM | POA: Diagnosis not present

## 2020-11-27 DIAGNOSIS — K219 Gastro-esophageal reflux disease without esophagitis: Secondary | ICD-10-CM | POA: Diagnosis not present

## 2020-11-27 DIAGNOSIS — G9009 Other idiopathic peripheral autonomic neuropathy: Secondary | ICD-10-CM | POA: Diagnosis not present

## 2020-11-27 DIAGNOSIS — Z9181 History of falling: Secondary | ICD-10-CM | POA: Diagnosis not present

## 2020-11-27 DIAGNOSIS — M5136 Other intervertebral disc degeneration, lumbar region: Secondary | ICD-10-CM | POA: Diagnosis not present

## 2020-11-27 DIAGNOSIS — N1831 Chronic kidney disease, stage 3a: Secondary | ICD-10-CM | POA: Diagnosis not present

## 2020-11-27 DIAGNOSIS — I872 Venous insufficiency (chronic) (peripheral): Secondary | ICD-10-CM | POA: Diagnosis not present

## 2020-11-27 DIAGNOSIS — M1990 Unspecified osteoarthritis, unspecified site: Secondary | ICD-10-CM | POA: Diagnosis not present

## 2020-11-27 DIAGNOSIS — I130 Hypertensive heart and chronic kidney disease with heart failure and stage 1 through stage 4 chronic kidney disease, or unspecified chronic kidney disease: Secondary | ICD-10-CM | POA: Diagnosis not present

## 2020-11-27 DIAGNOSIS — Z7982 Long term (current) use of aspirin: Secondary | ICD-10-CM | POA: Diagnosis not present

## 2020-11-27 DIAGNOSIS — E538 Deficiency of other specified B group vitamins: Secondary | ICD-10-CM | POA: Diagnosis not present

## 2020-11-27 DIAGNOSIS — I5032 Chronic diastolic (congestive) heart failure: Secondary | ICD-10-CM | POA: Diagnosis not present

## 2020-11-27 DIAGNOSIS — M810 Age-related osteoporosis without current pathological fracture: Secondary | ICD-10-CM | POA: Diagnosis not present

## 2020-11-27 DIAGNOSIS — I2510 Atherosclerotic heart disease of native coronary artery without angina pectoris: Secondary | ICD-10-CM | POA: Diagnosis not present

## 2020-11-27 DIAGNOSIS — I272 Pulmonary hypertension, unspecified: Secondary | ICD-10-CM | POA: Diagnosis not present

## 2020-11-27 DIAGNOSIS — Z7901 Long term (current) use of anticoagulants: Secondary | ICD-10-CM | POA: Diagnosis not present

## 2020-11-27 DIAGNOSIS — R1310 Dysphagia, unspecified: Secondary | ICD-10-CM | POA: Diagnosis not present

## 2020-11-27 MED ORDER — METOPROLOL SUCCINATE ER 25 MG PO TB24: 25.0000 mg | ORAL_TABLET | Freq: Every day | ORAL | 6 refills | Status: DC

## 2020-11-27 MED ORDER — AMLODIPINE BESYLATE 5 MG PO TABS
5.0000 mg | ORAL_TABLET | Freq: Every day | ORAL | 6 refills | Status: DC
Start: 2020-11-27 — End: 2021-01-12

## 2020-11-27 NOTE — Telephone Encounter (Signed)
Patient's daughter left a voicemail stating that she wanted to make sure that Dr. Danise Mina sees this my chart message about her mom.[name] stated that her mom's blood pressure continues to run high.

## 2020-11-28 ENCOUNTER — Ambulatory Visit (INDEPENDENT_AMBULATORY_CARE_PROVIDER_SITE_OTHER): Payer: Medicare Other | Admitting: Vascular Surgery

## 2020-11-28 ENCOUNTER — Encounter (INDEPENDENT_AMBULATORY_CARE_PROVIDER_SITE_OTHER): Payer: Medicare Other

## 2020-11-28 DIAGNOSIS — I2510 Atherosclerotic heart disease of native coronary artery without angina pectoris: Secondary | ICD-10-CM | POA: Diagnosis not present

## 2020-11-28 DIAGNOSIS — M1990 Unspecified osteoarthritis, unspecified site: Secondary | ICD-10-CM | POA: Diagnosis not present

## 2020-11-28 DIAGNOSIS — E7849 Other hyperlipidemia: Secondary | ICD-10-CM | POA: Diagnosis not present

## 2020-11-28 DIAGNOSIS — G2581 Restless legs syndrome: Secondary | ICD-10-CM | POA: Diagnosis not present

## 2020-11-28 DIAGNOSIS — Z9181 History of falling: Secondary | ICD-10-CM | POA: Diagnosis not present

## 2020-11-28 DIAGNOSIS — I872 Venous insufficiency (chronic) (peripheral): Secondary | ICD-10-CM | POA: Diagnosis not present

## 2020-11-28 DIAGNOSIS — I130 Hypertensive heart and chronic kidney disease with heart failure and stage 1 through stage 4 chronic kidney disease, or unspecified chronic kidney disease: Secondary | ICD-10-CM | POA: Diagnosis not present

## 2020-11-28 DIAGNOSIS — Z7901 Long term (current) use of anticoagulants: Secondary | ICD-10-CM | POA: Diagnosis not present

## 2020-11-28 DIAGNOSIS — M810 Age-related osteoporosis without current pathological fracture: Secondary | ICD-10-CM | POA: Diagnosis not present

## 2020-11-28 DIAGNOSIS — R1310 Dysphagia, unspecified: Secondary | ICD-10-CM | POA: Diagnosis not present

## 2020-11-28 DIAGNOSIS — E538 Deficiency of other specified B group vitamins: Secondary | ICD-10-CM | POA: Diagnosis not present

## 2020-11-28 DIAGNOSIS — M5136 Other intervertebral disc degeneration, lumbar region: Secondary | ICD-10-CM | POA: Diagnosis not present

## 2020-11-28 DIAGNOSIS — I272 Pulmonary hypertension, unspecified: Secondary | ICD-10-CM | POA: Diagnosis not present

## 2020-11-28 DIAGNOSIS — I4891 Unspecified atrial fibrillation: Secondary | ICD-10-CM | POA: Diagnosis not present

## 2020-11-28 DIAGNOSIS — I5032 Chronic diastolic (congestive) heart failure: Secondary | ICD-10-CM | POA: Diagnosis not present

## 2020-11-28 DIAGNOSIS — G9009 Other idiopathic peripheral autonomic neuropathy: Secondary | ICD-10-CM | POA: Diagnosis not present

## 2020-11-28 DIAGNOSIS — Z7982 Long term (current) use of aspirin: Secondary | ICD-10-CM | POA: Diagnosis not present

## 2020-11-28 DIAGNOSIS — N1831 Chronic kidney disease, stage 3a: Secondary | ICD-10-CM | POA: Diagnosis not present

## 2020-11-28 DIAGNOSIS — K219 Gastro-esophageal reflux disease without esophagitis: Secondary | ICD-10-CM | POA: Diagnosis not present

## 2020-11-29 DIAGNOSIS — I130 Hypertensive heart and chronic kidney disease with heart failure and stage 1 through stage 4 chronic kidney disease, or unspecified chronic kidney disease: Secondary | ICD-10-CM | POA: Diagnosis not present

## 2020-11-29 DIAGNOSIS — I2510 Atherosclerotic heart disease of native coronary artery without angina pectoris: Secondary | ICD-10-CM | POA: Diagnosis not present

## 2020-11-29 DIAGNOSIS — Z9181 History of falling: Secondary | ICD-10-CM | POA: Diagnosis not present

## 2020-11-29 DIAGNOSIS — I272 Pulmonary hypertension, unspecified: Secondary | ICD-10-CM | POA: Diagnosis not present

## 2020-11-29 DIAGNOSIS — N1831 Chronic kidney disease, stage 3a: Secondary | ICD-10-CM | POA: Diagnosis not present

## 2020-11-29 DIAGNOSIS — Z7901 Long term (current) use of anticoagulants: Secondary | ICD-10-CM | POA: Diagnosis not present

## 2020-11-29 DIAGNOSIS — I4891 Unspecified atrial fibrillation: Secondary | ICD-10-CM | POA: Diagnosis not present

## 2020-11-29 DIAGNOSIS — E538 Deficiency of other specified B group vitamins: Secondary | ICD-10-CM | POA: Diagnosis not present

## 2020-11-29 DIAGNOSIS — M1990 Unspecified osteoarthritis, unspecified site: Secondary | ICD-10-CM | POA: Diagnosis not present

## 2020-11-29 DIAGNOSIS — M810 Age-related osteoporosis without current pathological fracture: Secondary | ICD-10-CM | POA: Diagnosis not present

## 2020-11-29 DIAGNOSIS — E7849 Other hyperlipidemia: Secondary | ICD-10-CM | POA: Diagnosis not present

## 2020-11-29 DIAGNOSIS — G9009 Other idiopathic peripheral autonomic neuropathy: Secondary | ICD-10-CM | POA: Diagnosis not present

## 2020-11-29 DIAGNOSIS — Z7982 Long term (current) use of aspirin: Secondary | ICD-10-CM | POA: Diagnosis not present

## 2020-11-29 DIAGNOSIS — M5136 Other intervertebral disc degeneration, lumbar region: Secondary | ICD-10-CM | POA: Diagnosis not present

## 2020-11-29 DIAGNOSIS — K219 Gastro-esophageal reflux disease without esophagitis: Secondary | ICD-10-CM | POA: Diagnosis not present

## 2020-11-29 DIAGNOSIS — R1310 Dysphagia, unspecified: Secondary | ICD-10-CM | POA: Diagnosis not present

## 2020-11-29 DIAGNOSIS — I872 Venous insufficiency (chronic) (peripheral): Secondary | ICD-10-CM | POA: Diagnosis not present

## 2020-11-29 DIAGNOSIS — G2581 Restless legs syndrome: Secondary | ICD-10-CM | POA: Diagnosis not present

## 2020-11-29 DIAGNOSIS — I5032 Chronic diastolic (congestive) heart failure: Secondary | ICD-10-CM | POA: Diagnosis not present

## 2020-11-29 NOTE — Telephone Encounter (Signed)
Pt ready for scheduling on or after 01/05/21  Out-of-pocket cost due at time of visit: $285  Primary: [facility] Medicare [place] co-insurance: 20% (approximately $255) Admin fee co-insurance: $30  Secondary: n/a [place] co-insurance:  Admin fee co-insurance:   Deductible: does not apply  Prior Auth: APPROVED PA# [id number] Valid: 11/29/20-11/29/21

## 2020-11-29 NOTE — Telephone Encounter (Signed)
Benefits submitted. OOP cost is $300. PA not needed - reference [id number]. Spoke with [name], patient's daughter, and since she is not due till 01/05/21 asked for me to call back and follow up in about 2 weeks.

## 2020-11-30 ENCOUNTER — Telehealth: Payer: Self-pay | Admitting: Cardiovascular Disease

## 2020-11-30 ENCOUNTER — Telehealth: Payer: Self-pay

## 2020-11-30 ENCOUNTER — Other Ambulatory Visit: Payer: Self-pay

## 2020-11-30 ENCOUNTER — Encounter: Payer: Self-pay | Admitting: Cardiovascular Disease

## 2020-11-30 ENCOUNTER — Ambulatory Visit (INDEPENDENT_AMBULATORY_CARE_PROVIDER_SITE_OTHER): Payer: Medicare Other | Admitting: Cardiovascular Disease

## 2020-11-30 VITALS — BP 160/110 | HR 128 | Ht 62.0 in | Wt 125.4 lb

## 2020-11-30 DIAGNOSIS — I4891 Unspecified atrial fibrillation: Secondary | ICD-10-CM | POA: Diagnosis not present

## 2020-11-30 DIAGNOSIS — E785 Hyperlipidemia, unspecified: Secondary | ICD-10-CM

## 2020-11-30 DIAGNOSIS — M1990 Unspecified osteoarthritis, unspecified site: Secondary | ICD-10-CM | POA: Diagnosis not present

## 2020-11-30 DIAGNOSIS — G9009 Other idiopathic peripheral autonomic neuropathy: Secondary | ICD-10-CM | POA: Diagnosis not present

## 2020-11-30 DIAGNOSIS — I4819 Other persistent atrial fibrillation: Secondary | ICD-10-CM

## 2020-11-30 DIAGNOSIS — M810 Age-related osteoporosis without current pathological fracture: Secondary | ICD-10-CM | POA: Diagnosis not present

## 2020-11-30 DIAGNOSIS — E7849 Other hyperlipidemia: Secondary | ICD-10-CM | POA: Diagnosis not present

## 2020-11-30 DIAGNOSIS — I272 Pulmonary hypertension, unspecified: Secondary | ICD-10-CM | POA: Diagnosis not present

## 2020-11-30 DIAGNOSIS — R1310 Dysphagia, unspecified: Secondary | ICD-10-CM | POA: Diagnosis not present

## 2020-11-30 DIAGNOSIS — I2510 Atherosclerotic heart disease of native coronary artery without angina pectoris: Secondary | ICD-10-CM | POA: Diagnosis not present

## 2020-11-30 DIAGNOSIS — I10 Essential (primary) hypertension: Secondary | ICD-10-CM | POA: Diagnosis not present

## 2020-11-30 DIAGNOSIS — K219 Gastro-esophageal reflux disease without esophagitis: Secondary | ICD-10-CM | POA: Diagnosis not present

## 2020-11-30 DIAGNOSIS — I4892 Unspecified atrial flutter: Secondary | ICD-10-CM | POA: Diagnosis not present

## 2020-11-30 DIAGNOSIS — Z7982 Long term (current) use of aspirin: Secondary | ICD-10-CM | POA: Diagnosis not present

## 2020-11-30 DIAGNOSIS — Z9181 History of falling: Secondary | ICD-10-CM | POA: Diagnosis not present

## 2020-11-30 DIAGNOSIS — I5032 Chronic diastolic (congestive) heart failure: Secondary | ICD-10-CM

## 2020-11-30 DIAGNOSIS — I739 Peripheral vascular disease, unspecified: Secondary | ICD-10-CM

## 2020-11-30 DIAGNOSIS — I130 Hypertensive heart and chronic kidney disease with heart failure and stage 1 through stage 4 chronic kidney disease, or unspecified chronic kidney disease: Secondary | ICD-10-CM | POA: Diagnosis not present

## 2020-11-30 DIAGNOSIS — E538 Deficiency of other specified B group vitamins: Secondary | ICD-10-CM | POA: Diagnosis not present

## 2020-11-30 DIAGNOSIS — G2581 Restless legs syndrome: Secondary | ICD-10-CM | POA: Diagnosis not present

## 2020-11-30 DIAGNOSIS — I872 Venous insufficiency (chronic) (peripheral): Secondary | ICD-10-CM | POA: Diagnosis not present

## 2020-11-30 DIAGNOSIS — Z7901 Long term (current) use of anticoagulants: Secondary | ICD-10-CM | POA: Diagnosis not present

## 2020-11-30 DIAGNOSIS — M5136 Other intervertebral disc degeneration, lumbar region: Secondary | ICD-10-CM | POA: Diagnosis not present

## 2020-11-30 DIAGNOSIS — N1831 Chronic kidney disease, stage 3a: Secondary | ICD-10-CM | POA: Diagnosis not present

## 2020-11-30 MED ORDER — AMIODARONE HCL 200 MG PO TABS: 200.0000 mg | ORAL_TABLET | Freq: Two times a day (BID) | ORAL | 1 refills | Status: DC

## 2020-11-30 MED ORDER — METOPROLOL SUCCINATE ER 25 MG PO TB24: 25.0000 mg | ORAL_TABLET | Freq: Two times a day (BID) | ORAL | 1 refills | Status: DC

## 2020-11-30 MED ORDER — LISINOPRIL 20 MG PO TABS: 20.0000 mg | ORAL_TABLET | Freq: Every day | ORAL | 1 refills | Status: DC

## 2020-11-30 NOTE — Telephone Encounter (Signed)
Christine RN with [employer] called to report patient's b/p due to its outside of normal perimeters and reading is 180/104 and Pulse is 114-irregular due to afib per Altha Harm and patient is on medication for afib. Altha Harm is with patient right now. Patient is asymptomatic and her legs are elevated. Dr Darnell Level is not in the office and patient does have an appointment with her cardiologist Dr Fletcher Anon today at 1:20 pm. I spoke with Ozzie Hoyle, RN and per that conversation asked Altha Harm to call Dr Tyrell Antonio office to notify them also since hse has an appointment with them today to make sure it is safe to wait and come see them later today and any other recommendations they may have for the patient. Per recent mychart notes b/p has been running high the last few days.  Since Dr Darnell Level is not in the office to consult with Altha Harm will call Dr Fletcher Anon. FYI to PCP

## 2020-11-30 NOTE — Patient Instructions (Signed)
Medication Instructions:  Your physician has recommended you make the following change in your medication:   1) INCREASE Amiodarone to 200 mg twice daily. An Rx has been sent to your pharmacy.  2) INCREASE Metoprolol Succinate to 25 mg twice daily. An Rx has been sent to your pharmacy.  3) INCREASE Lisinopril to 20 mg daily. An Rx has been sent to your pharmacy.    *If you need a refill on your cardiac medications before your next appointment, please call your pharmacy*   Lab Work: Bmp, Tsh today If you have labs (blood work) drawn today and your tests are completely normal, you will receive your results only by: [place] (if you have MyChart) OR A paper copy in the mail If you have any lab test that is abnormal or we need to change your treatment, we will call you to review the results.   Testing/Procedures: None ordered   Follow-Up: At [facility], you and your health needs are our priority.  As part of our continuing mission to provide you with exceptional heart care, we have created designated Provider Care Teams.  These Care Teams include your primary Cardiologist (physician) and Advanced Practice Providers (APPs -  Physician Assistants and Nurse Practitioners) who all work together to provide you with the care you need, when you need it.  We recommend signing up for the patient portal called "MyChart".  Sign up information is provided on this After Visit Summary.  MyChart is used to connect with patients for Virtual Visits (Telemedicine).  Patients are able to view lab/test results, encounter notes, upcoming appointments, etc.  Non-urgent messages can be sent to your provider as well.   To learn more about what you can do with MyChart, go to [email or web address].    Your next appointment:   2 week(s)  The format for your next appointment:   In Person  Provider:   You may see [name], MD or one of the following Advanced Practice Providers on your  designated Care Team:   Murray Hodgkins, NP Christell Faith, PA-C Marrianne Mood, PA-C Cadence Kathlen Mody, [place]   Other Instructions N/A

## 2020-11-30 NOTE — Telephone Encounter (Signed)
Incoming call received from [employer] with [employer]. Altha Harm called to report patient's b/p due to its outside of normal perimeters and reading is 180/104 and Pulse is 114-irregular due to afib.  Per Altha Harm the patient is asymptomatic. The patient has taken her medications this morning.  Altha Harm sts that the patient has been keep a log of her BP and that the patients pcp has recently made adjustments to the pt bp meds.  Patient is schedule to see Dr. Fletcher Anon today at 1:20 pm. Moss Mc that if the patient remains asymptomatic she should keep the scheduled appt and bring her bp log with her. If in the interim the pt develops care she should seek emergent care in the ED.  Christine agreed with the plan and will share the recommendation with the patient.

## 2020-11-30 NOTE — Telephone Encounter (Signed)
Patient has an appt today at 1:40 with DR. Arida, home health nurse is calling with patient's BP now is 180/104 HR 114 "afib"

## 2020-11-30 NOTE — Telephone Encounter (Signed)
Noted. Pt seen today in afib with increase in lisinopril and toprol xl and amiodarone

## 2020-11-30 NOTE — Progress Notes (Signed)
Cardiology Office Note   Date:  11/30/2020   ID:  [name], DOB [birth year], MRN [id number]  PCP:  Crystal Bush, Payne  Cardiologist:   Crystal Sacramento, Payne   Chief Complaint  Patient presents with   Other    4 month f/u c/o elevated BP and edema ankles/redness.  Meds reviewed verbally with pt.      History of Present Illness: [name] is a 87 y.o. female who is here today for follow-up visit regarding persistent atrial fibrillation maintaining sinus rhythm with amiodarone, mild nonobstructive coronary artery disease and chronic diastolic heart failure.  Other medical problems include chronic dyspnea and fatigue, essential hypertension, hyperlipidemia, rheumatoid arthritis and peripheral arterial disease.   Most recent echocardiogram in February 2021 showed normal LV systolic function, grade 2 diastolic dysfunction, severe pulmonary hypertension, moderate mitral regurgitation and moderate tricuspid regurgitation. Right and left cardiac catheterization was done in April 2021.  It showed mild to moderate nonobstructive coronary artery disease.  Worst stenosis was 50% in the ostial right coronary artery which was not significant by fractional flow reserve.  Right heart catheterization at that time showed mildly elevated filling pressures, mild pulmonary hypertension and low normal cardiac output. She was hospitalized in July after a fall and fracture of the right wrist.  She had acute on chronic renal failure which improved after discontinuing furosemide.  She had lower extremity arterial angiogram done by Dr. Lucky Payne with endovascular intervention on tibial vessels bilaterally.  She had COVID-19 infection in June of this year and was treated with PACs [name].  She was hospitalized in July with cholelithiasis.  She was found to have elevated LFTs and mild lactic acidosis.  She was septic with E. coli.  She was diagnosed with ascending cholangitis and was treated with antibiotics.   The patient and family declined cholecystectomy.  She did have intermittent atrial fibrillation during hospitalization.  She has done well since then but was noted to be tachycardic today by the visiting nurse and thus she is here for evaluation.  She is noted to be in atrial fibrillation with rapid ventricular response.  In spite of this, she denies chest pain, shortness of breath or palpitations.     Past Medical History:  Diagnosis Date   Abdominal aortic atherosclerosis ([place]) 09/2015   by xray   Anxiety    BCC (basal cell carcinoma of skin) 05/2011   on nose   BCC (basal cell carcinoma of skin) 08/09/2020   upper chest   Breast lesion    a. diagnosed with complex sclerosing lesion with calcifications of the right breast in 08/2016   Chest pain    a. nuclear stress test 03/2012: normal; b. 09/2018 MV: EF >65%, no isch/scar.    DDD (degenerative disc disease), lumbosacral 12/06/1992   Gallstones 09/2015   incidentally by xray   GERD (gastroesophageal reflux disease) 05/1999   HERPES ZOSTER 04/13/2007   Qualifier: Diagnosis of  By: [name]    Hiatal hernia    History of shingles    ophthalmic, takes acyclovir daily   Hyperlipidemia 12/2003   Hypertension 1990   Mitral regurgitation    a. echo 03/2012: EF 60-65%, no RWMA, mild to mod AI/MR, mod TR, PASP 37 mmHg   Osteoarthritis 08/21/1989   Osteoporosis with fracture 11/1997   with compression fracture T12   Persistent atrial fibrillation ([place]) 03/16/2012   a. CHADS2VASc => 5 (HTN, age x 2, vascular disease, sex category)-->Eliquis 5 BID;  b. Recurrent AF in 06/2016;  c. 01/2017 s/p DCCV;  d. 02/2017 recurrent AFib->Amio added->DCCV; e. 03/2017 recurrent AFib, s/p reload of amio and DCCV 04/02/2017   POSTHERPETIC NEURALGIA 04/20/2007   Qualifier: Diagnosis of  By: [name]    Rheumatoid arthritis ([place])    RLS (restless legs syndrome)     Past Surgical History:  Procedure Laterality Date    ABDOMINAL HYSTERECTOMY  Age 55 - 38   S/P TAH   abdominal ultrasound  04/23/2004   Gallstones   BREAST EXCISIONAL BIOPSY Right 2018   neg/benign complex sclerosing lesion   BREAST LUMPECTOMY WITH RADIOACTIVE SEED LOCALIZATION Right 10/2016   breast lumpectomy with radioactive seed localization and margin assessment for complex sclerosing lesion ([place])   BREAST LUMPECTOMY WITH RADIOACTIVE SEED LOCALIZATION Right 11/05/2016   Procedure: RIGHT BREAST LUMPECTOMY WITH RADIOACTIVE SEED LOCALIZATION;  Surgeon: [name];  Location: [facility];  Service: General;  Laterality: Right;   CARDIAC CATHETERIZATION     CARDIOVERSION N/A 01/31/2017   Procedure: CARDIOVERSION;  Surgeon: [name];  Location: [facility];  Service: Cardiovascular;  Laterality: N/A;   CARDIOVERSION N/A 03/03/2017   Procedure: CARDIOVERSION;  Surgeon: [name];  Location: [facility];  Service: Cardiovascular;  Laterality: N/A;   CARDIOVERSION N/A 04/02/2017   Procedure: CARDIOVERSION;  Surgeon: [name];  Location: [facility];  Service: Cardiovascular;  Laterality: N/A;   CARDIOVERSION N/A 01/16/2018   Procedure: CARDIOVERSION;  Surgeon: [name];  Location: [facility];  Service: Cardiovascular;  Laterality: N/A;   CATARACT EXTRACTION  05/2010   left eye   CERVICAL DISCECTOMY  1992   Fusion ([name])   ERCP N/A 10/30/2020   Procedure: ENDOSCOPIC RETROGRADE CHOLANGIOPANCREATOGRAPHY (ERCP);  Surgeon: [name];  Location: [facility] ENDOSCOPY;  Service: Endoscopy;  Laterality: N/A;   ESI Bilateral 01/2016   S1 transforaminal ESI x3   ESOPHAGOGASTRODUODENOSCOPY  01/01/2002   with ulcer, bx. neg; + stricture gastric ulcer with hemorrhage   ESOPHAGOGASTRODUODENOSCOPY  04/23/2004   Esophageal stricture -- dilated   INTRAVASCULAR PRESSURE WIRE/FFR STUDY N/A 07/12/2019   Procedure: INTRAVASCULAR PRESSURE WIRE/FFR STUDY;  Surgeon: [name];   Location: [place] [facility];  Service: Cardiovascular;  Laterality: N/A;   LOWER EXTREMITY ANGIOGRAPHY Left 02/01/2019   LOWER EXTREMITY ANGIOGRAPHY;  Surgeon: Crystal Huxley, Payne   LOWER EXTREMITY ANGIOGRAPHY Right 03/25/2019   Procedure: LOWER EXTREMITY ANGIOGRAPHY;  Surgeon: Crystal Huxley, Payne;  Location: [place] [facility];  Service: Cardiovascular;  Laterality: Right;   LOWER EXTREMITY ANGIOGRAPHY Right 08/02/2019   Procedure: LOWER EXTREMITY ANGIOGRAPHY;  Surgeon: Crystal Huxley, Payne;  Location: [place] [facility];  Service: Cardiovascular;  Laterality: Right;   nuclear stress test  03/2012   no ischemia   PERIPHERAL VASCULAR BALLOON ANGIOPLASTY Left 01/2019   Percutaneous transluminal angioplasty of left anterior and posterior tibial artery with 2.5 mm diameter by 22 cm length angioplasty balloon ([place])   REMOVAL OF STONES  10/30/2020   Procedure: REMOVAL OF STONES;  Surgeon: [name];  Location: [facility];  Service: Endoscopy;;   RIGHT/LEFT HEART CATH AND CORONARY ANGIOGRAPHY Bilateral 07/12/2019   Procedure: RIGHT/LEFT HEART CATH AND CORONARY ANGIOGRAPHY;  Surgeon: [name];  Location: [place] [facility];  Service: Cardiovascular;  Laterality: Bilateral;   SKIN CANCER EXCISION  05/20/2011   BCC from nose   SPHINCTEROTOMY  10/30/2020   Procedure: SPHINCTEROTOMY;  Surgeon:  [name];  Location: [facility] END[facility];  Service: Endoscopy;;   US ECHOCARDIOGRAPHY  03/2012   in flutter, EF 60%, mild-mod aortic, mitral, tricuspid regurg, mildly dilated LA     Current Outpatient Medications  Medication Sig Dispense Refill   acetaminophen (TYLENOL) 500 MG tablet Take 500 mg by mouth every 6 (six) hours as needed for mild pain.     acyclovir (ZOVIRAX) 400 MG tablet Take 400 mg by mouth 2 (two) times daily.     Alpha-Lipoic Acid 600 MG TABS Take 1 tablet by mouth daily.     amiodarone (PACERONE) 100 MG tablet Take 100 mg by mouth daily.     amLODipine (NORVASC) 5 MG  tablet Take 1 tablet (5 mg total) by mouth daily. 30 tablet 6   apixaban (ELIQUIS) 2.5 MG TABS tablet TAKE 1 TABLET BY MOUTH TWICE DAILY 180 tablet 1   aspirin EC 81 MG tablet Take 81 mg by mouth daily. Swallow whole.     atorvastatin (LIPITOR) 20 MG tablet TAKE 1 TABLET BY MOUTH ONCE DAILY 90 tablet 3   Docusate Calcium (STOOL SOFTENER PO) Take by mouth daily.     DULoxetine (CYMBALTA) 60 MG capsule TAKE 1 CAPSULE BY MOUTH ONCE DAILY 90 capsule 0   gabapentin (NEURONTIN) 400 MG capsule Take 1 capsule (400 mg total) by mouth at bedtime. With second dose during day as needed 150 capsule 1   lisinopril (ZESTRIL) 10 MG tablet Take 1 tablet (10 mg total) by mouth daily. 90 tablet 3   memantine (NAMENDA) 5 MG tablet TAKE 1 TABLET BY MOUTH TWICE DAILY 60 tablet 6   metoprolol succinate (TOPROL-XL) 25 MG 24 hr tablet Take 1 tablet (25 mg total) by mouth daily. 30 tablet 6   pantoprazole (PROTONIX) 40 MG tablet TAKE 1 TABLET BY MOUTH ONCE DAILY 90 tablet 0   polyethylene glycol (MIRALAX / GLYCOLAX) 17 g packet Take 17 g by mouth daily as needed for mild constipation. 14 each 0   prednisoLONE acetate (PRED FORTE) 1 % ophthalmic suspension Place 1 drop into the left eye daily.      rOPINIRole (REQUIP) 1 MG tablet TAKE 2 AND 1/2 TABLETS BY MOUTH AT BEDTIME 225 tablet 1   tiZANidine (ZANAFLEX) 2 MG tablet Take 2 mg by mouth daily.     vitamin B-12 (CYANOCOBALAMIN) 1000 MCG tablet Take 1 tablet (1,000 mcg total) by mouth daily.     Vitamin D, Ergocalciferol, (DRISDOL) 1.25 MG (50000 UNIT) CAPS capsule TAKE 1 CAPSULE BY MOUTH ONCE WEEKLY 12 capsule 3   doxycycline (VIBRA-TABS) 100 MG tablet Take 1 tablet (100 mg total) by mouth 2 (two) times daily. (Patient not taking: Reported on 11/30/2020) 84 tablet 0   silver sulfADIAZINE (SILVADENE) 1 % cream Apply 1 application topically daily. (Patient not taking: Reported on 11/30/2020) 50 g 0   No current facility-administered medications for this visit.     Allergies:   Penicillins    Social History:  The patient  reports that she quit smoking about 44 years ago. Her smoking use included cigarettes. She has a 0.25 pack-year smoking history. She has never used smokeless tobacco. She reports that she does not drink alcohol and does not use drugs.   Family History:  The patient's family history includes Cancer in her brother; Diabetes in her brother, brother, brother, and sister; Emphysema in her mother; Heart disease in her brother and father; Hypertension in her brother; Stroke in her brother, father, and sister.  ROS:  Please see the history of present illness.   Otherwise, review of systems are positive for none.   All other systems are reviewed and negative.    PHYSICAL EXAM: VS:  BP (!) 160/110 (BP Location: Left Arm, Patient Position: Sitting, Cuff Size: Normal)   Pulse (!) 128   Ht '5\' 2"'$  (1.575 m)   Wt 125 lb 6 oz (56.9 kg)   SpO2 98%   BMI 22.93 kg/m  , BMI Body mass index is 22.93 kg/m. GEN: Well nourished, well developed, in no acute distress  HEENT: normal  Neck: no JVD, carotid bruits, or masses Cardiac: Irregularly irregular,no murmurs, rubs, or gallops, mild bilateral leg edema worse on the left side with chronic stasis dermatitis. Respiratory:  clear to auscultation bilaterally, normal work of breathing GI: soft, nontender, nondistended, + BS MS: no deformity or atrophy  Skin: warm and dry, no rash Neuro:  Strength and sensation are intact Psych: euthymic mood, full affect  EKG:  EKG is ordered today. The ekg ordered today demonstrates atrial fibrillation with ventricular rate 128 bpm.  Nonspecific ST changes.   Recent Labs: 02/21/2020: TSH 3.62 10/25/2020: Magnesium 1.6 11/01/2020: ALT 71; BUN 11; Creatinine, Ser 0.96; Hemoglobin 10.4; Platelets 263; Potassium 3.3; Sodium 139    Lipid Panel    Component Value Date/Time   CHOL 133 06/15/2020 1012   TRIG 70.0 06/15/2020 1012   HDL 71.70 06/15/2020 1012    CHOLHDL 2 06/15/2020 1012   VLDL 14.0 06/15/2020 1012   LDLCALC 47 06/15/2020 1012   LDLDIRECT 173.1 02/19/2010 0908      Wt Readings from Last 3 Encounters:  11/30/20 125 lb 6 oz (56.9 kg)  11/06/20 127 lb 2 oz (57.7 kg)  10/25/20 130 lb (59 kg)       No flowsheet data found.    ASSESSMENT AND PLAN:  1.  Persistent atrial fibrillation: The patient was maintaining in sinus rhythm with small dose amiodarone.  However, she is noted to be in A. fib with RVR today.  She is on anticoagulation with low-dose Eliquis given her age and weight.  Atrial fibrillation in the past was associated with significant heart failure and thus I think it is important to get her back in sinus rhythm.  Thus, I elected to increase amiodarone to 200 mg twice daily and Toprol to 25 mg twice daily.  She was mildly hypokalemic recently and thus I requested basic metabolic profile and will also check TSH given that she is on amiodarone.   The patient will follow-up in 2 weeks and if she is still in A. fib, recommend proceeding with cardioversion at that time.  I am hoping she will convert chemically.  2. Chronic diastolic heart failure: In spite of being in atrial fibrillation, she does not seem to be in heart failure.  We will monitor her symptoms closely and add a loop diuretic in the near future if needed.  3. Essential hypertension: Blood pressure has been elevated and thus I increase Toprol and also increase lisinopril to 20 mg daily.  3. Hyperlipidemia: Currently on atorvastatin 20 mg once daily with most recent LDL of 50.   5.  Peripheral arterial disease: Status post tibial vessel intervention managed by Dr. Lucky Payne.  Long discussion with the patient and her daughter who was present at the visit.  In addition, I spoke with her other daughter on the phone.  Total encounter time of 35 minutes . Greater than 50% was spent in counseling and  coordination of care with the patient and family  Disposition:   FU in 2  weeks  Signed,  Crystal Sacramento, Payne  11/30/2020 1:21 PM    [place]

## 2020-12-01 ENCOUNTER — Telehealth: Payer: Self-pay | Admitting: Family Medicine

## 2020-12-01 DIAGNOSIS — M810 Age-related osteoporosis without current pathological fracture: Secondary | ICD-10-CM | POA: Diagnosis not present

## 2020-12-01 DIAGNOSIS — I2510 Atherosclerotic heart disease of native coronary artery without angina pectoris: Secondary | ICD-10-CM | POA: Diagnosis not present

## 2020-12-01 DIAGNOSIS — M5136 Other intervertebral disc degeneration, lumbar region: Secondary | ICD-10-CM | POA: Diagnosis not present

## 2020-12-01 DIAGNOSIS — I272 Pulmonary hypertension, unspecified: Secondary | ICD-10-CM | POA: Diagnosis not present

## 2020-12-01 DIAGNOSIS — E538 Deficiency of other specified B group vitamins: Secondary | ICD-10-CM | POA: Diagnosis not present

## 2020-12-01 DIAGNOSIS — I5032 Chronic diastolic (congestive) heart failure: Secondary | ICD-10-CM | POA: Diagnosis not present

## 2020-12-01 DIAGNOSIS — G9009 Other idiopathic peripheral autonomic neuropathy: Secondary | ICD-10-CM | POA: Diagnosis not present

## 2020-12-01 DIAGNOSIS — N1831 Chronic kidney disease, stage 3a: Secondary | ICD-10-CM | POA: Diagnosis not present

## 2020-12-01 DIAGNOSIS — Z7982 Long term (current) use of aspirin: Secondary | ICD-10-CM | POA: Diagnosis not present

## 2020-12-01 DIAGNOSIS — Z9181 History of falling: Secondary | ICD-10-CM | POA: Diagnosis not present

## 2020-12-01 DIAGNOSIS — R1310 Dysphagia, unspecified: Secondary | ICD-10-CM | POA: Diagnosis not present

## 2020-12-01 DIAGNOSIS — K219 Gastro-esophageal reflux disease without esophagitis: Secondary | ICD-10-CM | POA: Diagnosis not present

## 2020-12-01 DIAGNOSIS — E7849 Other hyperlipidemia: Secondary | ICD-10-CM | POA: Diagnosis not present

## 2020-12-01 DIAGNOSIS — Z7901 Long term (current) use of anticoagulants: Secondary | ICD-10-CM | POA: Diagnosis not present

## 2020-12-01 DIAGNOSIS — I130 Hypertensive heart and chronic kidney disease with heart failure and stage 1 through stage 4 chronic kidney disease, or unspecified chronic kidney disease: Secondary | ICD-10-CM | POA: Diagnosis not present

## 2020-12-01 DIAGNOSIS — G2581 Restless legs syndrome: Secondary | ICD-10-CM | POA: Diagnosis not present

## 2020-12-01 DIAGNOSIS — I4891 Unspecified atrial fibrillation: Secondary | ICD-10-CM | POA: Diagnosis not present

## 2020-12-01 DIAGNOSIS — I872 Venous insufficiency (chronic) (peripheral): Secondary | ICD-10-CM | POA: Diagnosis not present

## 2020-12-01 DIAGNOSIS — M1990 Unspecified osteoarthritis, unspecified site: Secondary | ICD-10-CM | POA: Diagnosis not present

## 2020-12-01 LAB — BASIC METABOLIC PANEL
BUN/Creatinine Ratio: 18 (ref 12–28)
BUN: 15 mg/dL (ref 8–27)
CO2: 23 mmol/L (ref 20–29)
Calcium: 9.8 mg/dL (ref 8.7–10.3)
Chloride: 102 mmol/L (ref 96–106)
Creatinine, Ser: 0.84 mg/dL (ref 0.57–1.00)
Glucose: 98 mg/dL (ref 65–99)
Potassium: 4 mmol/L (ref 3.5–5.2)
Sodium: 141 mmol/L (ref 134–144)
eGFR: 66 mL/min/{1.73_m2} (ref >59)

## 2020-12-01 LAB — TSH: TSH: 4.44 u[IU]/mL (ref 0.450–4.500)

## 2020-12-01 NOTE — Telephone Encounter (Signed)
Opened in error

## 2020-12-04 DIAGNOSIS — K219 Gastro-esophageal reflux disease without esophagitis: Secondary | ICD-10-CM | POA: Diagnosis not present

## 2020-12-04 DIAGNOSIS — M810 Age-related osteoporosis without current pathological fracture: Secondary | ICD-10-CM | POA: Diagnosis not present

## 2020-12-04 DIAGNOSIS — I130 Hypertensive heart and chronic kidney disease with heart failure and stage 1 through stage 4 chronic kidney disease, or unspecified chronic kidney disease: Secondary | ICD-10-CM | POA: Diagnosis not present

## 2020-12-04 DIAGNOSIS — Z9181 History of falling: Secondary | ICD-10-CM | POA: Diagnosis not present

## 2020-12-04 DIAGNOSIS — E7849 Other hyperlipidemia: Secondary | ICD-10-CM | POA: Diagnosis not present

## 2020-12-04 DIAGNOSIS — I272 Pulmonary hypertension, unspecified: Secondary | ICD-10-CM | POA: Diagnosis not present

## 2020-12-04 DIAGNOSIS — Z7982 Long term (current) use of aspirin: Secondary | ICD-10-CM | POA: Diagnosis not present

## 2020-12-04 DIAGNOSIS — I4891 Unspecified atrial fibrillation: Secondary | ICD-10-CM | POA: Diagnosis not present

## 2020-12-04 DIAGNOSIS — M5136 Other intervertebral disc degeneration, lumbar region: Secondary | ICD-10-CM | POA: Diagnosis not present

## 2020-12-04 DIAGNOSIS — I5032 Chronic diastolic (congestive) heart failure: Secondary | ICD-10-CM | POA: Diagnosis not present

## 2020-12-04 DIAGNOSIS — G9009 Other idiopathic peripheral autonomic neuropathy: Secondary | ICD-10-CM | POA: Diagnosis not present

## 2020-12-04 DIAGNOSIS — M1990 Unspecified osteoarthritis, unspecified site: Secondary | ICD-10-CM | POA: Diagnosis not present

## 2020-12-04 DIAGNOSIS — N1831 Chronic kidney disease, stage 3a: Secondary | ICD-10-CM | POA: Diagnosis not present

## 2020-12-04 DIAGNOSIS — R1310 Dysphagia, unspecified: Secondary | ICD-10-CM | POA: Diagnosis not present

## 2020-12-04 DIAGNOSIS — I872 Venous insufficiency (chronic) (peripheral): Secondary | ICD-10-CM | POA: Diagnosis not present

## 2020-12-04 DIAGNOSIS — Z7901 Long term (current) use of anticoagulants: Secondary | ICD-10-CM | POA: Diagnosis not present

## 2020-12-04 DIAGNOSIS — E538 Deficiency of other specified B group vitamins: Secondary | ICD-10-CM | POA: Diagnosis not present

## 2020-12-04 DIAGNOSIS — I2510 Atherosclerotic heart disease of native coronary artery without angina pectoris: Secondary | ICD-10-CM | POA: Diagnosis not present

## 2020-12-04 DIAGNOSIS — G2581 Restless legs syndrome: Secondary | ICD-10-CM | POA: Diagnosis not present

## 2020-12-05 DIAGNOSIS — Z7901 Long term (current) use of anticoagulants: Secondary | ICD-10-CM | POA: Diagnosis not present

## 2020-12-05 DIAGNOSIS — E7849 Other hyperlipidemia: Secondary | ICD-10-CM | POA: Diagnosis not present

## 2020-12-05 DIAGNOSIS — I872 Venous insufficiency (chronic) (peripheral): Secondary | ICD-10-CM | POA: Diagnosis not present

## 2020-12-05 DIAGNOSIS — Z9181 History of falling: Secondary | ICD-10-CM | POA: Diagnosis not present

## 2020-12-05 DIAGNOSIS — I5032 Chronic diastolic (congestive) heart failure: Secondary | ICD-10-CM | POA: Diagnosis not present

## 2020-12-05 DIAGNOSIS — M810 Age-related osteoporosis without current pathological fracture: Secondary | ICD-10-CM | POA: Diagnosis not present

## 2020-12-05 DIAGNOSIS — G2581 Restless legs syndrome: Secondary | ICD-10-CM | POA: Diagnosis not present

## 2020-12-05 DIAGNOSIS — Z7982 Long term (current) use of aspirin: Secondary | ICD-10-CM | POA: Diagnosis not present

## 2020-12-05 DIAGNOSIS — E538 Deficiency of other specified B group vitamins: Secondary | ICD-10-CM | POA: Diagnosis not present

## 2020-12-05 DIAGNOSIS — K219 Gastro-esophageal reflux disease without esophagitis: Secondary | ICD-10-CM | POA: Diagnosis not present

## 2020-12-05 DIAGNOSIS — M1990 Unspecified osteoarthritis, unspecified site: Secondary | ICD-10-CM | POA: Diagnosis not present

## 2020-12-05 DIAGNOSIS — G9009 Other idiopathic peripheral autonomic neuropathy: Secondary | ICD-10-CM | POA: Diagnosis not present

## 2020-12-05 DIAGNOSIS — I272 Pulmonary hypertension, unspecified: Secondary | ICD-10-CM | POA: Diagnosis not present

## 2020-12-05 DIAGNOSIS — R1310 Dysphagia, unspecified: Secondary | ICD-10-CM | POA: Diagnosis not present

## 2020-12-05 DIAGNOSIS — N1831 Chronic kidney disease, stage 3a: Secondary | ICD-10-CM | POA: Diagnosis not present

## 2020-12-05 DIAGNOSIS — I2510 Atherosclerotic heart disease of native coronary artery without angina pectoris: Secondary | ICD-10-CM | POA: Diagnosis not present

## 2020-12-05 DIAGNOSIS — I4891 Unspecified atrial fibrillation: Secondary | ICD-10-CM | POA: Diagnosis not present

## 2020-12-05 DIAGNOSIS — M5136 Other intervertebral disc degeneration, lumbar region: Secondary | ICD-10-CM | POA: Diagnosis not present

## 2020-12-05 DIAGNOSIS — I130 Hypertensive heart and chronic kidney disease with heart failure and stage 1 through stage 4 chronic kidney disease, or unspecified chronic kidney disease: Secondary | ICD-10-CM | POA: Diagnosis not present

## 2020-12-06 DIAGNOSIS — I4891 Unspecified atrial fibrillation: Secondary | ICD-10-CM | POA: Diagnosis not present

## 2020-12-06 DIAGNOSIS — I272 Pulmonary hypertension, unspecified: Secondary | ICD-10-CM | POA: Diagnosis not present

## 2020-12-06 DIAGNOSIS — K219 Gastro-esophageal reflux disease without esophagitis: Secondary | ICD-10-CM | POA: Diagnosis not present

## 2020-12-06 DIAGNOSIS — E7849 Other hyperlipidemia: Secondary | ICD-10-CM | POA: Diagnosis not present

## 2020-12-06 DIAGNOSIS — Z7901 Long term (current) use of anticoagulants: Secondary | ICD-10-CM | POA: Diagnosis not present

## 2020-12-06 DIAGNOSIS — G9009 Other idiopathic peripheral autonomic neuropathy: Secondary | ICD-10-CM | POA: Diagnosis not present

## 2020-12-06 DIAGNOSIS — N1831 Chronic kidney disease, stage 3a: Secondary | ICD-10-CM | POA: Diagnosis not present

## 2020-12-06 DIAGNOSIS — M810 Age-related osteoporosis without current pathological fracture: Secondary | ICD-10-CM | POA: Diagnosis not present

## 2020-12-06 DIAGNOSIS — Z9181 History of falling: Secondary | ICD-10-CM | POA: Diagnosis not present

## 2020-12-06 DIAGNOSIS — I5032 Chronic diastolic (congestive) heart failure: Secondary | ICD-10-CM | POA: Diagnosis not present

## 2020-12-06 DIAGNOSIS — M1990 Unspecified osteoarthritis, unspecified site: Secondary | ICD-10-CM | POA: Diagnosis not present

## 2020-12-06 DIAGNOSIS — I872 Venous insufficiency (chronic) (peripheral): Secondary | ICD-10-CM | POA: Diagnosis not present

## 2020-12-06 DIAGNOSIS — Z7982 Long term (current) use of aspirin: Secondary | ICD-10-CM | POA: Diagnosis not present

## 2020-12-06 DIAGNOSIS — I130 Hypertensive heart and chronic kidney disease with heart failure and stage 1 through stage 4 chronic kidney disease, or unspecified chronic kidney disease: Secondary | ICD-10-CM | POA: Diagnosis not present

## 2020-12-06 DIAGNOSIS — R1310 Dysphagia, unspecified: Secondary | ICD-10-CM | POA: Diagnosis not present

## 2020-12-06 DIAGNOSIS — M5136 Other intervertebral disc degeneration, lumbar region: Secondary | ICD-10-CM | POA: Diagnosis not present

## 2020-12-06 DIAGNOSIS — G2581 Restless legs syndrome: Secondary | ICD-10-CM | POA: Diagnosis not present

## 2020-12-06 DIAGNOSIS — I2510 Atherosclerotic heart disease of native coronary artery without angina pectoris: Secondary | ICD-10-CM | POA: Diagnosis not present

## 2020-12-06 DIAGNOSIS — E538 Deficiency of other specified B group vitamins: Secondary | ICD-10-CM | POA: Diagnosis not present

## 2020-12-07 DIAGNOSIS — Z7982 Long term (current) use of aspirin: Secondary | ICD-10-CM | POA: Diagnosis not present

## 2020-12-07 DIAGNOSIS — M1990 Unspecified osteoarthritis, unspecified site: Secondary | ICD-10-CM | POA: Diagnosis not present

## 2020-12-07 DIAGNOSIS — I130 Hypertensive heart and chronic kidney disease with heart failure and stage 1 through stage 4 chronic kidney disease, or unspecified chronic kidney disease: Secondary | ICD-10-CM | POA: Diagnosis not present

## 2020-12-07 DIAGNOSIS — K219 Gastro-esophageal reflux disease without esophagitis: Secondary | ICD-10-CM | POA: Diagnosis not present

## 2020-12-07 DIAGNOSIS — R1310 Dysphagia, unspecified: Secondary | ICD-10-CM | POA: Diagnosis not present

## 2020-12-07 DIAGNOSIS — I272 Pulmonary hypertension, unspecified: Secondary | ICD-10-CM | POA: Diagnosis not present

## 2020-12-07 DIAGNOSIS — I4891 Unspecified atrial fibrillation: Secondary | ICD-10-CM | POA: Diagnosis not present

## 2020-12-07 DIAGNOSIS — G2581 Restless legs syndrome: Secondary | ICD-10-CM | POA: Diagnosis not present

## 2020-12-07 DIAGNOSIS — Z7901 Long term (current) use of anticoagulants: Secondary | ICD-10-CM | POA: Diagnosis not present

## 2020-12-07 DIAGNOSIS — E7849 Other hyperlipidemia: Secondary | ICD-10-CM | POA: Diagnosis not present

## 2020-12-07 DIAGNOSIS — I2510 Atherosclerotic heart disease of native coronary artery without angina pectoris: Secondary | ICD-10-CM | POA: Diagnosis not present

## 2020-12-07 DIAGNOSIS — E538 Deficiency of other specified B group vitamins: Secondary | ICD-10-CM | POA: Diagnosis not present

## 2020-12-07 DIAGNOSIS — M5136 Other intervertebral disc degeneration, lumbar region: Secondary | ICD-10-CM | POA: Diagnosis not present

## 2020-12-07 DIAGNOSIS — G9009 Other idiopathic peripheral autonomic neuropathy: Secondary | ICD-10-CM | POA: Diagnosis not present

## 2020-12-07 DIAGNOSIS — M810 Age-related osteoporosis without current pathological fracture: Secondary | ICD-10-CM | POA: Diagnosis not present

## 2020-12-07 DIAGNOSIS — I872 Venous insufficiency (chronic) (peripheral): Secondary | ICD-10-CM | POA: Diagnosis not present

## 2020-12-07 DIAGNOSIS — N1831 Chronic kidney disease, stage 3a: Secondary | ICD-10-CM | POA: Diagnosis not present

## 2020-12-07 DIAGNOSIS — Z9181 History of falling: Secondary | ICD-10-CM | POA: Diagnosis not present

## 2020-12-07 DIAGNOSIS — I5032 Chronic diastolic (congestive) heart failure: Secondary | ICD-10-CM | POA: Diagnosis not present

## 2020-12-08 DIAGNOSIS — E538 Deficiency of other specified B group vitamins: Secondary | ICD-10-CM | POA: Diagnosis not present

## 2020-12-08 DIAGNOSIS — G9009 Other idiopathic peripheral autonomic neuropathy: Secondary | ICD-10-CM | POA: Diagnosis not present

## 2020-12-08 DIAGNOSIS — Z9181 History of falling: Secondary | ICD-10-CM | POA: Diagnosis not present

## 2020-12-08 DIAGNOSIS — K219 Gastro-esophageal reflux disease without esophagitis: Secondary | ICD-10-CM | POA: Diagnosis not present

## 2020-12-08 DIAGNOSIS — N1831 Chronic kidney disease, stage 3a: Secondary | ICD-10-CM | POA: Diagnosis not present

## 2020-12-08 DIAGNOSIS — I5032 Chronic diastolic (congestive) heart failure: Secondary | ICD-10-CM | POA: Diagnosis not present

## 2020-12-08 DIAGNOSIS — E7849 Other hyperlipidemia: Secondary | ICD-10-CM | POA: Diagnosis not present

## 2020-12-08 DIAGNOSIS — M5136 Other intervertebral disc degeneration, lumbar region: Secondary | ICD-10-CM | POA: Diagnosis not present

## 2020-12-08 DIAGNOSIS — I130 Hypertensive heart and chronic kidney disease with heart failure and stage 1 through stage 4 chronic kidney disease, or unspecified chronic kidney disease: Secondary | ICD-10-CM | POA: Diagnosis not present

## 2020-12-08 DIAGNOSIS — I272 Pulmonary hypertension, unspecified: Secondary | ICD-10-CM | POA: Diagnosis not present

## 2020-12-08 DIAGNOSIS — R1310 Dysphagia, unspecified: Secondary | ICD-10-CM | POA: Diagnosis not present

## 2020-12-08 DIAGNOSIS — I4891 Unspecified atrial fibrillation: Secondary | ICD-10-CM | POA: Diagnosis not present

## 2020-12-08 DIAGNOSIS — G2581 Restless legs syndrome: Secondary | ICD-10-CM | POA: Diagnosis not present

## 2020-12-08 DIAGNOSIS — M810 Age-related osteoporosis without current pathological fracture: Secondary | ICD-10-CM | POA: Diagnosis not present

## 2020-12-08 DIAGNOSIS — M1990 Unspecified osteoarthritis, unspecified site: Secondary | ICD-10-CM | POA: Diagnosis not present

## 2020-12-08 DIAGNOSIS — Z7901 Long term (current) use of anticoagulants: Secondary | ICD-10-CM | POA: Diagnosis not present

## 2020-12-08 DIAGNOSIS — I872 Venous insufficiency (chronic) (peripheral): Secondary | ICD-10-CM | POA: Diagnosis not present

## 2020-12-08 DIAGNOSIS — Z7982 Long term (current) use of aspirin: Secondary | ICD-10-CM | POA: Diagnosis not present

## 2020-12-08 DIAGNOSIS — I2510 Atherosclerotic heart disease of native coronary artery without angina pectoris: Secondary | ICD-10-CM | POA: Diagnosis not present

## 2020-12-11 DIAGNOSIS — Z9181 History of falling: Secondary | ICD-10-CM | POA: Diagnosis not present

## 2020-12-11 DIAGNOSIS — K219 Gastro-esophageal reflux disease without esophagitis: Secondary | ICD-10-CM | POA: Diagnosis not present

## 2020-12-11 DIAGNOSIS — E538 Deficiency of other specified B group vitamins: Secondary | ICD-10-CM | POA: Diagnosis not present

## 2020-12-11 DIAGNOSIS — M1990 Unspecified osteoarthritis, unspecified site: Secondary | ICD-10-CM | POA: Diagnosis not present

## 2020-12-11 DIAGNOSIS — G9009 Other idiopathic peripheral autonomic neuropathy: Secondary | ICD-10-CM | POA: Diagnosis not present

## 2020-12-11 DIAGNOSIS — I5032 Chronic diastolic (congestive) heart failure: Secondary | ICD-10-CM | POA: Diagnosis not present

## 2020-12-11 DIAGNOSIS — I130 Hypertensive heart and chronic kidney disease with heart failure and stage 1 through stage 4 chronic kidney disease, or unspecified chronic kidney disease: Secondary | ICD-10-CM | POA: Diagnosis not present

## 2020-12-11 DIAGNOSIS — R1310 Dysphagia, unspecified: Secondary | ICD-10-CM | POA: Diagnosis not present

## 2020-12-11 DIAGNOSIS — E7849 Other hyperlipidemia: Secondary | ICD-10-CM | POA: Diagnosis not present

## 2020-12-11 DIAGNOSIS — I2510 Atherosclerotic heart disease of native coronary artery without angina pectoris: Secondary | ICD-10-CM | POA: Diagnosis not present

## 2020-12-11 DIAGNOSIS — I872 Venous insufficiency (chronic) (peripheral): Secondary | ICD-10-CM | POA: Diagnosis not present

## 2020-12-11 DIAGNOSIS — Z7982 Long term (current) use of aspirin: Secondary | ICD-10-CM | POA: Diagnosis not present

## 2020-12-11 DIAGNOSIS — I272 Pulmonary hypertension, unspecified: Secondary | ICD-10-CM | POA: Diagnosis not present

## 2020-12-11 DIAGNOSIS — I4891 Unspecified atrial fibrillation: Secondary | ICD-10-CM | POA: Diagnosis not present

## 2020-12-11 DIAGNOSIS — M810 Age-related osteoporosis without current pathological fracture: Secondary | ICD-10-CM | POA: Diagnosis not present

## 2020-12-11 DIAGNOSIS — M5136 Other intervertebral disc degeneration, lumbar region: Secondary | ICD-10-CM | POA: Diagnosis not present

## 2020-12-11 DIAGNOSIS — Z7901 Long term (current) use of anticoagulants: Secondary | ICD-10-CM | POA: Diagnosis not present

## 2020-12-11 DIAGNOSIS — G2581 Restless legs syndrome: Secondary | ICD-10-CM | POA: Diagnosis not present

## 2020-12-11 DIAGNOSIS — N1831 Chronic kidney disease, stage 3a: Secondary | ICD-10-CM | POA: Diagnosis not present

## 2020-12-12 ENCOUNTER — Other Ambulatory Visit: Payer: Self-pay

## 2020-12-12 ENCOUNTER — Other Ambulatory Visit: Payer: Medicare Other | Admitting: Student

## 2020-12-12 DIAGNOSIS — I10 Essential (primary) hypertension: Secondary | ICD-10-CM | POA: Diagnosis not present

## 2020-12-12 DIAGNOSIS — R5383 Other fatigue: Secondary | ICD-10-CM | POA: Diagnosis not present

## 2020-12-12 DIAGNOSIS — Z515 Encounter for palliative care: Secondary | ICD-10-CM | POA: Diagnosis not present

## 2020-12-12 DIAGNOSIS — I4819 Other persistent atrial fibrillation: Secondary | ICD-10-CM | POA: Diagnosis not present

## 2020-12-12 NOTE — Progress Notes (Signed)
[facility] Pall[facility] Consult Note Telephone: [phone]  Fax: [phone]    Date of encounter: 12/12/20 11:13 AM PATIENT NAME: [name] [address] [place] [address]   [phone] (home)  DOB: 1930/01/03 MRN: [id number] PRIMARY CARE PROVIDER:    Ria Bush, MD,  [place] [address] [phone]  REFERRING PROVIDER:   Ria Bush, MD [address] [place],  [place] [address] [phone]  RESPONSIBLE PARTY:    Contact Information     Name Relation Home Work [place] Daughter [phone]     [facility] Daughter [phone]  [phone]   [name] Daughter [phone]  [phone]   [place] Daughter   [phone]        I met face to face with patient and caregiver in the home. Palliative Care was asked to follow this patient by consultation request of  Crystal Bush, MD to address advance care planning and complex medical decision making. This is a follow up visit.                                   ASSESSMENT AND PLAN / RECOMMENDATIONS:   Advance Care Planning/Goals of Care: Goals include to maximize quality of life and symptom management.  CODE STATUS: DNR  Symptom Management/Plan:  Fatigue-likely secondary to her atrial fibrillation, chronic diastolic heart failure. Continue ambulating as tolerated. Continue well balanced diet. Follow up with cardiology as scheduled.   Persistent Atrial fibrillation-patient continues to have tachycardia, although pulse rate has come down from the 120's. Per log in the home, her pulse has consistently been around 110 since medication changes and seeing cardiology. She is open to cardioversion if needed. Continue amiodarone, Toprol and Eliquis as directed. Follow up with cardiology on Friday as scheduled.   Hypertension-continue Toprol and lisinopril as directed. Continue to check blood  pressures routinely in the home.   Follow up Palliative Care Visit: Palliative care will continue to follow for complex medical decision making, advance care planning, and clarification of goals. Return in 8 weeks or prn.  I spent 40 minutes providing this consultation. More than 50% of the time in this consultation was spent in counseling and care coordination.   PPS: 40%  HOSPICE ELIGIBILITY/DIAGNOSIS: TBD  Chief Complaint: Palliative Medicine follow up visit.   HISTORY OF PRESENT ILLNESS:  [name] is a 87 y.o. year old female  with PVD, chronic ulcer to right foot, osteomyelitis, chronic diastolic heart failure, mild dementia, polyneuropathy, RLS, hypertension, atrial fibrillation, hyperlipidemia, CKD stage 3.   Patient resides in [employer] apartment. She reports doing better overall. She was seen by Cardiology on 12/03/20 due to her increased blood pressures; she was found to be in atrial fibrillation. She amiodarone and Toprol were increased. She denies chest pain, palpitations; she does endorse fatigue. She has been receiving therapy. PT has completed; she is awaiting to see if she OT will continue. She has been using walker for ambulation. Reports appetite improving. She is sleeping well at night. Wounds to right foot and legs have healed. A 10-point review of systems is negative, except for the pertinent positives and negatives detailed in the HPI.     History obtained from review of EMR, discussion with primary team, and interview with family, facility staff/caregiver and/or [name].  I reviewed available labs, medications, imaging, studies and related documents from  the EMR.  Records reviewed and summarized above.    Physical Exam: Weight: 129.6 pounds yesterday Pulse 108, resp 16, b/p160/98, sats 96% on room air Constitutional: NAD General: frail appearing  EYES: anicteric sclera, lids intact, no discharge  ENMT: intact hearing, oral mucous membranes moist,  dentition intact CV: S1S2, RRR, trace pedal edema Pulmonary: LCTA, no increased work of breathing, no cough Abdomen: normo-active BS + 4 quadrants, soft and non tender GU: deferred MSK: moves all extremities, ambulatory with walker Skin: warm and dry, no rashes or wounds on visible skin Neuro: generalized weakness, A & O x 3 Psych: non-anxious affect, pleasant Hem/lymph/immuno: no widespread bruising   Thank you for the opportunity to participate in the care of [name].  The palliative care team will continue to follow. Please call our office at [phone] if we can be of additional assistance.   Ezekiel Slocumb, NP   COVID-19 PATIENT SCREENING TOOL Asked and negative response unless otherwise noted:   Have you had symptoms of covid, tested positive or been in contact with someone with symptoms/positive test in the past 5-10 days? No

## 2020-12-13 ENCOUNTER — Telehealth: Payer: Self-pay | Admitting: Family Medicine

## 2020-12-13 DIAGNOSIS — Z9181 History of falling: Secondary | ICD-10-CM | POA: Diagnosis not present

## 2020-12-13 DIAGNOSIS — M5136 Other intervertebral disc degeneration, lumbar region: Secondary | ICD-10-CM | POA: Diagnosis not present

## 2020-12-13 DIAGNOSIS — E7849 Other hyperlipidemia: Secondary | ICD-10-CM | POA: Diagnosis not present

## 2020-12-13 DIAGNOSIS — M810 Age-related osteoporosis without current pathological fracture: Secondary | ICD-10-CM | POA: Diagnosis not present

## 2020-12-13 DIAGNOSIS — I5032 Chronic diastolic (congestive) heart failure: Secondary | ICD-10-CM | POA: Diagnosis not present

## 2020-12-13 DIAGNOSIS — I872 Venous insufficiency (chronic) (peripheral): Secondary | ICD-10-CM | POA: Diagnosis not present

## 2020-12-13 DIAGNOSIS — N1831 Chronic kidney disease, stage 3a: Secondary | ICD-10-CM | POA: Diagnosis not present

## 2020-12-13 DIAGNOSIS — M1990 Unspecified osteoarthritis, unspecified site: Secondary | ICD-10-CM | POA: Diagnosis not present

## 2020-12-13 DIAGNOSIS — Z7901 Long term (current) use of anticoagulants: Secondary | ICD-10-CM | POA: Diagnosis not present

## 2020-12-13 DIAGNOSIS — G2581 Restless legs syndrome: Secondary | ICD-10-CM | POA: Diagnosis not present

## 2020-12-13 DIAGNOSIS — R1310 Dysphagia, unspecified: Secondary | ICD-10-CM | POA: Diagnosis not present

## 2020-12-13 DIAGNOSIS — E538 Deficiency of other specified B group vitamins: Secondary | ICD-10-CM | POA: Diagnosis not present

## 2020-12-13 DIAGNOSIS — G9009 Other idiopathic peripheral autonomic neuropathy: Secondary | ICD-10-CM | POA: Diagnosis not present

## 2020-12-13 DIAGNOSIS — I272 Pulmonary hypertension, unspecified: Secondary | ICD-10-CM | POA: Diagnosis not present

## 2020-12-13 DIAGNOSIS — I2510 Atherosclerotic heart disease of native coronary artery without angina pectoris: Secondary | ICD-10-CM | POA: Diagnosis not present

## 2020-12-13 DIAGNOSIS — K219 Gastro-esophageal reflux disease without esophagitis: Secondary | ICD-10-CM | POA: Diagnosis not present

## 2020-12-13 DIAGNOSIS — I4891 Unspecified atrial fibrillation: Secondary | ICD-10-CM | POA: Diagnosis not present

## 2020-12-13 DIAGNOSIS — Z7982 Long term (current) use of aspirin: Secondary | ICD-10-CM | POA: Diagnosis not present

## 2020-12-13 DIAGNOSIS — I130 Hypertensive heart and chronic kidney disease with heart failure and stage 1 through stage 4 chronic kidney disease, or unspecified chronic kidney disease: Secondary | ICD-10-CM | POA: Diagnosis not present

## 2020-12-13 NOTE — Telephone Encounter (Signed)
Mrs. [name] called in and wanted to report  High BP reading 167/111.    Home Health verbal orders Caller Name: [place] Name: [facility]   Callback number: [phone]  Requesting OT/PT/Skilled nursing/Social Work/Speech: OT  Reason: INCREASE MOBILITY and ADLS  Frequency: 1W4  Please forward to [facility] pool or providers CMA

## 2020-12-14 ENCOUNTER — Telehealth: Payer: Self-pay | Admitting: Cardiovascular Disease

## 2020-12-14 DIAGNOSIS — R1310 Dysphagia, unspecified: Secondary | ICD-10-CM | POA: Diagnosis not present

## 2020-12-14 DIAGNOSIS — G2581 Restless legs syndrome: Secondary | ICD-10-CM | POA: Diagnosis not present

## 2020-12-14 DIAGNOSIS — E538 Deficiency of other specified B group vitamins: Secondary | ICD-10-CM | POA: Diagnosis not present

## 2020-12-14 DIAGNOSIS — I872 Venous insufficiency (chronic) (peripheral): Secondary | ICD-10-CM | POA: Diagnosis not present

## 2020-12-14 DIAGNOSIS — I272 Pulmonary hypertension, unspecified: Secondary | ICD-10-CM | POA: Diagnosis not present

## 2020-12-14 DIAGNOSIS — E7849 Other hyperlipidemia: Secondary | ICD-10-CM | POA: Diagnosis not present

## 2020-12-14 DIAGNOSIS — Z7901 Long term (current) use of anticoagulants: Secondary | ICD-10-CM | POA: Diagnosis not present

## 2020-12-14 DIAGNOSIS — M1990 Unspecified osteoarthritis, unspecified site: Secondary | ICD-10-CM | POA: Diagnosis not present

## 2020-12-14 DIAGNOSIS — G9009 Other idiopathic peripheral autonomic neuropathy: Secondary | ICD-10-CM | POA: Diagnosis not present

## 2020-12-14 DIAGNOSIS — I130 Hypertensive heart and chronic kidney disease with heart failure and stage 1 through stage 4 chronic kidney disease, or unspecified chronic kidney disease: Secondary | ICD-10-CM | POA: Diagnosis not present

## 2020-12-14 DIAGNOSIS — K219 Gastro-esophageal reflux disease without esophagitis: Secondary | ICD-10-CM | POA: Diagnosis not present

## 2020-12-14 DIAGNOSIS — I5032 Chronic diastolic (congestive) heart failure: Secondary | ICD-10-CM | POA: Diagnosis not present

## 2020-12-14 DIAGNOSIS — M810 Age-related osteoporosis without current pathological fracture: Secondary | ICD-10-CM | POA: Diagnosis not present

## 2020-12-14 DIAGNOSIS — Z7982 Long term (current) use of aspirin: Secondary | ICD-10-CM | POA: Diagnosis not present

## 2020-12-14 DIAGNOSIS — N1831 Chronic kidney disease, stage 3a: Secondary | ICD-10-CM | POA: Diagnosis not present

## 2020-12-14 DIAGNOSIS — I2510 Atherosclerotic heart disease of native coronary artery without angina pectoris: Secondary | ICD-10-CM | POA: Diagnosis not present

## 2020-12-14 DIAGNOSIS — I4891 Unspecified atrial fibrillation: Secondary | ICD-10-CM | POA: Diagnosis not present

## 2020-12-14 DIAGNOSIS — Z9181 History of falling: Secondary | ICD-10-CM | POA: Diagnosis not present

## 2020-12-14 DIAGNOSIS — M5136 Other intervertebral disc degeneration, lumbar region: Secondary | ICD-10-CM | POA: Diagnosis not present

## 2020-12-14 NOTE — Telephone Encounter (Signed)
Nurse with Home health called HR is 110 BP is 164/98.  Pt c/o BP issue: STAT if pt c/o blurred vision, one-sided weakness or slurred speech  1. What are your last 5 BP readings?  Today 164/98 HR 110 Yesterday 167/111 9/5 122/70 9/2 168/78  2. Are you having any other symptoms (ex. Dizziness, headache, blurred vision, passed out)? No other symptoms  3. What is your BP issue? elevated  Patient is being seen tomorrow by Marrianne Mood, PA

## 2020-12-14 NOTE — Telephone Encounter (Signed)
Spoke with Shirlean Mylar, RN patient home health nurse. VS provided below noted.  Patient is in no distress. Patients VS are similar to her last o/v. Patient has sevearl med changes at last visit. [place] that the patient is to keep her appt tomorrow for further recommendation. Shirlean Mylar agrees with the plan and voiced appreciation for the call back.

## 2020-12-14 NOTE — Telephone Encounter (Addendum)
Lvm asking Colletta [place] to call back.  Need to inform her Dr. Darnell Level is giving verbal orders for services requested for pt.  Spoke with pt's daughter, [name] (on dpr), relaying Dr. Synthia Innocent request BP readings.  Says she is driving now but will send readings in [place].  Also, [name] confirmed pt is on new BP med regimen.

## 2020-12-14 NOTE — Telephone Encounter (Signed)
Agree with [facility] OT request.  Can we call daughter to get some more BP readings?  Plz verify she's taking new regimen of toprol XL '25mg'$  bid, lisinopril '20mg'$  daily, and amlodipine '5mg'$  daily.

## 2020-12-15 ENCOUNTER — Ambulatory Visit (INDEPENDENT_AMBULATORY_CARE_PROVIDER_SITE_OTHER): Payer: Medicare Other | Admitting: Physician Assistant

## 2020-12-15 ENCOUNTER — Other Ambulatory Visit: Payer: Self-pay

## 2020-12-15 ENCOUNTER — Encounter: Payer: Self-pay | Admitting: Physician Assistant

## 2020-12-15 VITALS — BP 140/78 | HR 107 | Ht 62.0 in | Wt 129.1 lb

## 2020-12-15 DIAGNOSIS — I5032 Chronic diastolic (congestive) heart failure: Secondary | ICD-10-CM | POA: Diagnosis not present

## 2020-12-15 DIAGNOSIS — I739 Peripheral vascular disease, unspecified: Secondary | ICD-10-CM

## 2020-12-15 DIAGNOSIS — I10 Essential (primary) hypertension: Secondary | ICD-10-CM | POA: Diagnosis not present

## 2020-12-15 DIAGNOSIS — I2721 Secondary pulmonary arterial hypertension: Secondary | ICD-10-CM

## 2020-12-15 DIAGNOSIS — I4892 Unspecified atrial flutter: Secondary | ICD-10-CM

## 2020-12-15 DIAGNOSIS — I4819 Other persistent atrial fibrillation: Secondary | ICD-10-CM

## 2020-12-15 DIAGNOSIS — E785 Hyperlipidemia, unspecified: Secondary | ICD-10-CM | POA: Diagnosis not present

## 2020-12-15 MED ORDER — FUROSEMIDE 20 MG PO TABS: 20.0000 mg | ORAL_TABLET | ORAL | 3 refills | Status: DC | PRN

## 2020-12-15 NOTE — H&P (View-Only) (Signed)
Office Visit    Patient Name: [name] Date of Encounter: 12/15/2020  PCP:  Ria Bush, [facility]  Cardiologist:  [name], MD  Advanced Practice Provider:  Arvil Chaco, PA-C Electrophysiologist:  None [phone]   Chief Complaint    Chief Complaint  Patient presents with   Other    2 wk f/u c/o sob, edema ankles and fatigue. Meds reviewed verbally with pt.    87 y.o. female with history of atrial flutter/ fibrillation on reduced dose Eliquis 2.5 mg twice daily (see below regarding reduced dosing), HFpEF, pulmonary hypertension, MR/TR/AR, essential hypertension, hyperlipidemia, PAD with recent angiography of tibial disease and underwent angioplasty on bilateral tibial vessels, chronic fatigue, recent 3/30 CT showing vertebral body compression fractures at T5 and L1, and here for follow-up after recent presentation in Afib with SOB and sx of overload.  Past Medical History    Past Medical History:  Diagnosis Date   Abdominal aortic atherosclerosis ([place]) 09/2015   by xray   Anxiety    BCC (basal cell carcinoma of skin) 05/2011   on nose   BCC (basal cell carcinoma of skin) 08/09/2020   upper chest   Breast lesion    a. diagnosed with complex sclerosing lesion with calcifications of the right breast in 08/2016   Chest pain    a. nuclear stress test 03/2012: normal; b. 09/2018 MV: EF >65%, no isch/scar.    DDD (degenerative disc disease), lumbosacral 12/06/1992   Gallstones 09/2015   incidentally by xray   GERD (gastroesophageal reflux disease) 05/1999   HERPES ZOSTER 04/13/2007   Qualifier: Diagnosis of  By: [name], [name]    Hiatal hernia    History of shingles    ophthalmic, takes acyclovir daily   Hyperlipidemia 12/2003   Hypertension 1990   Mitral regurgitation    a. echo 03/2012: EF 60-65%, no RWMA, mild to mod AI/MR, mod TR, PASP 37 mmHg   Osteoarthritis 08/21/1989   Osteoporosis with fracture  11/1997   with compression fracture T12   Persistent atrial fibrillation ([place]) 03/16/2012   a. CHADS2VASc => 5 (HTN, age x 2, vascular disease, sex category)-->Eliquis 5 BID;  b. Recurrent AF in 06/2016;  c. 01/2017 s/p DCCV;  d. 02/2017 recurrent AFib->Amio added->DCCV; e. 03/2017 recurrent AFib, s/p reload of amio and DCCV 04/02/2017   POSTHERPETIC NEURALGIA 04/20/2007   Qualifier: Diagnosis of  By: [name], Billie-Lynn Daniels    Rheumatoid arthritis ([place])    RLS (restless legs syndrome)    Past Surgical History:  Procedure Laterality Date   ABDOMINAL HYSTERECTOMY  Age 68 - 67   S/P TAH   abdominal ultrasound  04/23/2004   Gallstones   BREAST EXCISIONAL BIOPSY Right 2018   neg/benign complex sclerosing lesion   BREAST LUMPECTOMY WITH RADIOACTIVE SEED LOCALIZATION Right 10/2016   breast lumpectomy with radioactive seed localization and margin assessment for complex sclerosing lesion ([place])   BREAST LUMPECTOMY WITH RADIOACTIVE SEED LOCALIZATION Right 11/05/2016   Procedure: RIGHT BREAST LUMPECTOMY WITH RADIOACTIVE SEED LOCALIZATION;  Surgeon: Fanny Skates, MD;  Location: [place];  Service: General;  Laterality: Right;   CARDIAC CATHETERIZATION     CARDIOVERSION N/A 01/31/2017   Procedure: CARDIOVERSION;  Surgeon: Wellington Hampshire, MD;  Location: [facility];  Service: Cardiovascular;  Laterality: N/A;   CARDIOVERSION N/A 03/03/2017   Procedure: CARDIOVERSION;  Surgeon: Wellington Hampshire, MD;  Location: [facility];  Service: Cardiovascular;  Laterality: N/A;   CARDIOVERSION N/A 04/02/2017   Procedure: CARDIOVERSION;  Surgeon: Wellington Hampshire, MD;  Location: [place] [facility];  Service: Cardiovascular;  Laterality: N/A;   CARDIOVERSION N/A 01/16/2018   Procedure: CARDIOVERSION;  Surgeon: Wellington Hampshire, MD;  Location: [facility];  Service: Cardiovascular;  Laterality: N/A;   CATARACT EXTRACTION  05/2010   left eye   CERVICAL DISCECTOMY  1992   Fusion (Dr. Joya Salm)   ERCP  N/A 10/30/2020   Procedure: ENDOSCOPIC RETROGRADE CHOLANGIOPANCREATOGRAPHY (ERCP);  Surgeon: Jackquline Denmark, MD;  Location: [facility];  Service: Endoscopy;  Laterality: N/A;   ESI Bilateral 01/2016   S1 transforaminal ESI x3   ESOPHAGOGASTRODUODENOSCOPY  01/01/2002   with ulcer, bx. neg; + stricture gastric ulcer with hemorrhage   ESOPHAGOGASTRODUODENOSCOPY  04/23/2004   Esophageal stricture -- dilated   INTRAVASCULAR PRESSURE WIRE/FFR STUDY N/A 07/12/2019   Procedure: INTRAVASCULAR PRESSURE WIRE/FFR STUDY;  Surgeon: Wellington Hampshire, MD;  Location: [place] [facility];  Service: Cardiovascular;  Laterality: N/A;   LOWER EXTREMITY ANGIOGRAPHY Left 02/01/2019   LOWER EXTREMITY ANGIOGRAPHY;  Surgeon: Algernon Huxley, MD   LOWER EXTREMITY ANGIOGRAPHY Right 03/25/2019   Procedure: LOWER EXTREMITY ANGIOGRAPHY;  Surgeon: Algernon Huxley, MD;  Location: [place] [facility];  Service: Cardiovascular;  Laterality: Right;   LOWER EXTREMITY ANGIOGRAPHY Right 08/02/2019   Procedure: LOWER EXTREMITY ANGIOGRAPHY;  Surgeon: Algernon Huxley, MD;  Location: [place] [facility];  Service: Cardiovascular;  Laterality: Right;   nuclear stress test  03/2012   no ischemia   PERIPHERAL VASCULAR BALLOON ANGIOPLASTY Left 01/2019   Percutaneous transluminal angioplasty of left anterior and posterior tibial artery with 2.5 mm diameter by 22 cm length angioplasty balloon ([place])   REMOVAL OF STONES  10/30/2020   Procedure: REMOVAL OF STONES;  Surgeon: Jackquline Denmark, MD;  Location: [facility] ENDOSCOPY;  Service: Endoscopy;;   RIGHT/LEFT HEART CATH AND CORONARY ANGIOGRAPHY Bilateral 07/12/2019   Procedure: RIGHT/LEFT HEART CATH AND CORONARY ANGIOGRAPHY;  Surgeon: Wellington Hampshire, MD;  Location: [place] [facility];  Service: Cardiovascular;  Laterality: Bilateral;   SKIN CANCER EXCISION  05/20/2011   BCC from nose   SPHINCTEROTOMY  10/30/2020   Procedure: SPHINCTEROTOMY;  Surgeon: Jackquline Denmark, MD;  Location: [facility] ENDOSCOPY;  Service:  Endoscopy;;   US ECHOCARDIOGRAPHY  03/2012   in flutter, EF 60%, mild-mod aortic, mitral, tricuspid regurg, mildly dilated LA    Allergies  Allergies  Allergen Reactions   Penicillins Rash and Other (See Comments)    Childhood Allergy Has patient had a PCN reaction causing immediate rash, facial/tongue/throat swelling, SOB or lightheadedness with hypotension: Yes Has patient had a PCN reaction causing severe rash involving mucus membranes or skin necrosis: Unknown Has patient had a PCN reaction that required hospitalization: No Has patient had a PCN reaction occurring within the last 10 years: No If all of the above answers are "NO", then may proceed with Cephalosporin use.     History of Present Illness    BLAIR MESINA is a 87 y.o. female with PMH as above. TALEISHA KACZYNSKI is a 87 y.o. female with PMH as above. She has a history of hearing loss.  She has a known history of persistent atrial fibrillation and flutter maintaining sinus rhythm with amiodarone, chronic dyspnea and fatigue, essential hypertension, hyperlipidemia, PAD, and rheumatoid arthritis.   She underwent angioplasty on bilateral tibial vessels with improvement in symptoms.     05/2019 echo showed nl LVSF, G2DD, severe pulmonary HTN, moderate  MR, moderate TR.   Seen in clinic with ongoing and progressive DOE/fatigue, as well as concern for worsening PAD.  She became significantly short of breath when she attempted to stand during her visit . [facility] appointment was recommended, given following her recent revascularization by [facility], she initially had pink feet that were warm at night but had since turned cyanotic and cold (with continued numbness and weakness).    [facility] 07/12/2019 showed mild to moderate nonobstructive CAD.  The worst stenosis was in the ostium of the RCA, which was calcified and eccentric.  Right heart cardiac catheterization showed mildly elevated left sided filling pressure with PCWP 15 mmHg and mild pulmonary  hypertension at 41/13 mmHg with low normal cardiac output at 4.02 and cardiac index 2.48.  Pulmonary vascular resistance was 2.74 Woods unit.  She was continued on her same dose of furosemide.   In July, she suffered a fracture to her R wrist after a fall. She had AOC renal failure, improving after discontinuing lasix. She had LE arterial angiogram by Dr. Lucky Cowboy with endovascular intervention and tibial vessels bilaterally.    When seen in clinic thereafter, she noted another fall and back discomfort. She continued to note DOE and some swelling in her leg, improving with elevation. She was continued on medical management without a diuretic, though it was noted she could consider lasix 49m once daily if needed. It was noted she could consider Coreg if BP continued to be elevated. She was kept at Eliquis 2.588mBID, given her recurrent falls (as no longer met criteria for reduced dosing). It was noted that there was no clear cardiac explanation for her DOE. PFTs could be considered, though given her chronic pain, it was doubtful she could give a good effort.    When seen in clinic 8/25 by her primary cardiologist, she was noted to be in Afib after maintaining NSR on small dose amiodarone.  She was continued on reduced dose of Eliquis, given her age and weight.  It was noted that atrial fibrillation in the past was associated with significant heart failure, thus return of NSR was stressed.  She is increased amiodarone 200 mg twice daily and Toprol 25 mg twice daily.  BMET and TSH checked and WNL.  Recommendation was to return to clinic in 2 weeks, and if still in atrial fibrillation at that time, repeat DCCV on amiodarone.  The hope is that she would undergo pharmacologic conversion before that time.  Today, 12/15/2020, she returns to clinic and is noted to be in 2-1 atrial flutter with ventricular rate 107 bpm.  She notes extreme fatigue.  In addition, she has shortness of breath, dyspnea, and ankle/pedal edema.   She has been taking her amiodarone 200 mg twice daily and increased dose Toprol.  She brings BP and heart rate log with her with SBP predominantly 150s to 160s with rare SBP 180 and DBP predominantly 80s to 90s with rare DBP 110s.  She reports that her knee feels better but she continues to have ankle swelling, worse on the left, and improving with elevation.  She notes improvement in her appetite and subsequent weight gain.  She denies any chest pain, tachypalpitations, presyncope, syncope's, no recent falls.  No signs or symptoms of bleeding.  She has not missed any doses of anticoagulation.  Weight today noted to be up.  Ventricular rate is slightly improved, as well as BP.  She continues to report a relatively sedentary lifestyle.  DCCV discussed as below.  Home Medications   Current Outpatient Medications  Medication Instructions   acetaminophen (TYLENOL) 500 mg, Oral, Every 6 hours PRN   acyclovir (ZOVIRAX) 400 mg, Oral, 2 times daily,     Alpha-Lipoic Acid 600 MG TABS 1 tablet, Oral, Daily   amiodarone (PACERONE) 200 mg, Oral, 2 times daily   amLODipine (NORVASC) 5 mg, Oral, Daily   apixaban (ELIQUIS) 2.5 MG TABS tablet TAKE 1 TABLET BY MOUTH TWICE DAILY   aspirin EC 81 mg, Oral, Daily, Swallow whole.   atorvastatin (LIPITOR) 20 MG tablet TAKE 1 TABLET BY MOUTH ONCE DAILY   Cyanocobalamin (B-12 COMPLIANCE INJECTION) 1000 MCG/ML KIT Injection, Every 30 days   Docusate Calcium (STOOL SOFTENER PO) Oral, Daily   doxycycline (VIBRA-TABS) 100 mg, Oral, 2 times daily   DULoxetine (CYMBALTA) 60 MG capsule TAKE 1 CAPSULE BY MOUTH ONCE DAILY   gabapentin (NEURONTIN) 400 mg, Oral, Daily at bedtime, With second dose during day as needed   lisinopril (ZESTRIL) 20 mg, Oral, Daily   memantine (NAMENDA) 5 MG tablet TAKE 1 TABLET BY MOUTH TWICE DAILY   metoprolol succinate (TOPROL-XL) 25 mg, Oral, 2 times daily   pantoprazole (PROTONIX) 40 MG tablet TAKE 1 TABLET BY MOUTH ONCE DAILY   polyethylene  glycol (MIRALAX / GLYCOLAX) 17 g, Oral, Daily PRN   prednisoLONE acetate (PRED FORTE) 1 % ophthalmic suspension 1 drop, Left Eye, Daily   rOPINIRole (REQUIP) 1 MG tablet TAKE 2 AND 1/2 TABLETS BY MOUTH AT BEDTIME   silver sulfADIAZINE (SILVADENE) 1 % cream 1 application, Topical, Daily   tiZANidine (ZANAFLEX) 2 mg, Oral, Daily   Vitamin D, Ergocalciferol, (DRISDOL) 1.25 MG (50000 UNIT) CAPS capsule TAKE 1 CAPSULE BY MOUTH ONCE WEEKLY     Review of Systems    She reports SOB, DOE, edema. She denies chest pain, palpitations, pnd, orthopnea, n, v, dizziness, syncope, or early satiety.  She notes improved appetite and attributes weight gain to caloric intake, though noted to Weight of 4 pounds over the last 2 weeks.   All other systems reviewed and are otherwise negative except as noted above.  Physical Exam    VS:  BP 140/78 (BP Location: Right Arm, Patient Position: Sitting, Cuff Size: Normal)   Pulse (!) 107   Ht _0  (1.575 m)   Wt 129 lb 2 oz (58.6 kg)   SpO2 98%   BMI 23.62 kg/m  , BMI Body mass index is 23.62 kg/m. GEN: Well nourished, well developed, in no acute distress. HEENT: normal. Neck: Supple, no JVD, carotid bruits, or masses. Cardiac: tachycardic and irregular, no murmurs, rubs, or gallops. No clubbing, cyanosis.  Mild bilateral edema, worse on the left side.  Chronic stasis dermatitis.  Radials 2+ and equal bilaterally.  Respiratory:  Respirations regular and unlabored, slightly reduced bilateral breath sounds but otherwise clear to auscultation bilaterally. GI: Soft, nontender, nondistended, BS + x 4. MS: no deformity or atrophy. Skin: warm and dry, no rash. Neuro:  Strength and sensation are intact. Psych: Normal affect.  Accessory Clinical Findings    ECG personally reviewed by me today -atrial flutter with 2-1 conduction, 107bpm, TWI inferior leads- no acute changes.  VITALS Reviewed today   Temp Readings from Last 3 Encounters:  11/06/20 98.7 F (37.1 C)  (Temporal)  11/01/20 97.8 F (36.6 C) (Oral)  10/27/20 98.8 F (37.1 C) (Oral)   BP Readings from Last 3 Encounters:  12/15/20 140/78  11/30/20 (!) 160/110  11/06/20 (!) 164/90   Pulse Readings from  Last 3 Encounters:  12/15/20 (!) 107  11/30/20 (!) 128  11/06/20 72    Wt Readings from Last 3 Encounters:  12/15/20 129 lb 2 oz (58.6 kg)  11/30/20 125 lb 6 oz (56.9 kg)  11/06/20 127 lb 2 oz (57.7 kg)     LABS  reviewed today   Lab Results  Component Value Date   WBC 8.0 11/01/2020   HGB 10.4 (L) 11/01/2020   HCT 32.4 (L) 11/01/2020   MCV 102.9 (H) 11/01/2020   PLT 263 11/01/2020   Lab Results  Component Value Date   CREATININE 0.84 11/30/2020   BUN 15 11/30/2020   NA 141 11/30/2020   K 4.0 11/30/2020   CL 102 11/30/2020   CO2 23 11/30/2020   Lab Results  Component Value Date   ALT 71 (H) 11/01/2020   AST 23 11/01/2020   ALKPHOS 151 (H) 11/01/2020   BILITOT 0.6 11/01/2020   Lab Results  Component Value Date   CHOL 133 06/15/2020   HDL 71.70 06/15/2020   LDLCALC 47 06/15/2020   LDLDIRECT 173.1 02/19/2010   TRIG 70.0 06/15/2020   CHOLHDL 2 06/15/2020    No results found for: HGBA1C Lab Results  Component Value Date   TSH 4.440 11/30/2020     STUDIES/PROCEDURES reviewed today   [facility] 07/12/19 Ost LAD lesion is 40% stenosed. Mid LAD lesion is 30% stenosed. The left ventricular systolic function is normal. LV end diastolic pressure is mildly elevated. The left ventricular ejection fraction is 55-65% by visual estimate. Ost RCA to Prox RCA lesion is 50% stenosed 1.  Mild to moderate nonobstructive coronary artery disease.  The worst stenosis was 50% in the ostium of the right coronary artery which was calcified and eccentric.  Fractional flow reserve was done on this lesion and was not significant with an iFR ratio of 0.98. 2.  Normal LV systolic function. 3.  Right heart catheterization showed mildly elevated left-sided filling pressure with pulmonary  capillary wedge pressure 15 mmHg, mild pulmonary hypertension at 41 over 13 mmHg and low normal cardiac output at 4.02 with a cardiac index of 2.48.  Pulmonary vascular resistance is 2.74 Woods unit Recommendations: Recommend medical therapy for nonobstructive coronary artery disease. Volume status appears reasonable and recommend continuing same dose of furosemide. The patient seems to be reasonably optimized from a cardiac standpoint. Avoid catheterization via the right radial artery in the future if needed given small vessel diameter which is diffusely diseased.   3/30 CT IMPRESSION: VASCULAR 1. Mild atherosclerotic disease throughout the aorta without evidence for an aortic aneurysm or dissection. Aortic Atherosclerosis (ICD10-I70.0). 2. No evidence for inflow or outflow disease. 3. Concern for bilateral runoff disease but limited evaluation of the runoff vessels bilaterally. Minimal flow in the distal runoff vessels but this could be related to technical factors and timing of the study. 4. At least mild stenosis involving the main renal arteries. 5. No evidence for a large or central pulmonary embolism. NON-VASCULAR 1. Vertebral body compression fractures at T5 and L1. Age of these fractures are unknown but the fractures could be associated with the patient's pain. 2. Cholelithiasis with distended gallbladder. No evidence for gallbladder inflammation. 3. Large amount of stool in the colon. 4. Moderate sized hiatal hernia. 5. No acute chest abnormality.   07/06/19 Bilateral ABIs appear essentially unchanged compared to prior study on  04/23/2019. Bilateral TBIs appear essentially unchanged compared to prior  study on 1/15/20212.  Summary:  Right: Resting right ankle-brachial index is  within normal range. No  evidence of significant right lower extremity arterial disease. The right  toe-brachial index is abnormal.  Left: Resting left ankle-brachial index is within normal  range. No  evidence of significant left lower extremity arterial disease. The left  toe-brachial index is abnormal.   05/28/19 Echo  1. Left ventricular ejection fraction, by estimation, is 60 to 65%. The  left ventricle has normal function. The left ventricle has no regional  wall motion abnormalities. There is mild left ventricular hypertrophy.  Left ventricular diastolic parameters  are consistent with Grade II diastolic dysfunction (pseudonormalization).   2. Right ventricular systolic function is normal. The right ventricular  size is mildly enlarged. mildly increased right ventricular wall  thickness. There is severely elevated pulmonary artery systolic pressure.   3. Left atrial size was mild to moderately dilated.   4. Right atrial size was mildly dilated.   5. Moderate mitral valve regurgitation.   6. Tricuspid valve regurgitation is moderate.   7. Aortic valve regurgitation is mild to moderate.   8. The inferior vena cava is normal in size with <50% respiratory  variability, suggesting right atrial pressure of 8 mmHg.    NM 09/2018 Normal pharmacologic myocardial perfusion stress test without significant ischemia or scar. The left ventricular ejection fraction is normal (>65%). This is a low risk study. Attenuation correction CT is notable for coronary artery and aortic atherosclerotic calcification, as well as aortic valve and mitral annular calcification. A moderate-sided hiatal hernia is present. Hyperdensity of the liver is noted, which can be seen with long-term amiodarone use. Clinical/laboratory correlation recommended. Splenic calcifications suggest prior granulomatous disease.  Assessment & Plan    Persistent atrial flutter / atrial fibrillation --EKG shows 2:1 atrial flutter with RVR today.  She is asymptomatic in atrial flutter and denies any tachypalpitations.  She is taking amiodarone 200 mg BID and Toprol 25 mg twice daily.  Ventricular rate remains elevated  but under 110 bpm at rest.  Given plan for increased diuresis as below, we will hold off on increasing her Toprol today and reassess via a patient MyChart message on Monday 9/12 with patient and family agreement.  Continue low-dose Eliquis 2.5 mg daily, given age and weight.  We will schedule for DCCV at first available appointment with patient agreement to proceed after review of risks and benefits of the procedure.  Shared Decision Making/Informed Consent The risks (stroke, cardiac arrhythmias rarely resulting in the need for a temporary or permanent pacemaker, skin irritation or burns and complications associated with conscious sedation including aspiration, arrhythmia, respiratory failure and death), benefits (restoration of normal sinus rhythm) and alternatives of a direct current cardioversion were explained in detail to Ms. Bosko and she agrees to proceed.    Chronic diastolic heart failure, Mild pulmonary hypertension Valvular Dz: Moderate MR/TR and mild to moderate AR DOE, SOB --Very slightly volume up, though suspect she will quickly diurese given diuretic nave.  Suspect volume increase in the setting of RVR.  Breathing difficulties likely multifactorial in the setting of volume overload, rapid ventricular rate, deconditioning, and valvular dz. Start lasix 18m daily for at least 2 days then reassess if should continue daily dosing or drop down to as needed based on 3 pound weight gain overnight or 5 pound weight gain in 1 week as discussed in detail today.  The patient will send a MyChart message on Monday to let [place] weights, heart rate, BP, and symptoms.on Monday 9/12, increase BB if needed for rate  control s/p diuresis.  As above, will plan for DCCV at first available appointment.   Essential hypertension --BP suboptimal with plan to increase diuresis as above. Continue current BB, lisinopril for now.   HLD --Continue current statin.     Peripheral arterial disease  --S/p  intervention with further recommendations per vascular surgery.  Continue ASA and statin.  ==============  Medication changes:  --Start Lasix 20 mg x 2 days and reassess on 9/12.   --Based on pt 9/12 MyChart message with vitals, will either proceed with Lasix 20 mg daily thereafter or drop down to as needed/sliding scale dosing after that time.  Will also decide if incr dose beta-blocker needed at that time for ventricular rate control.  Labs ordered: BMET Studies / Imaging ordered: DCCV Future considerations: ?Increase BB after weekend? ?Continue diuresis? (Pt will send MyChart msg) Disposition: RTC after DCCV  *Please be aware that the above documentation was completed voice recognition software and may contain dictation errors.    Arvil Chaco, PA-C 12/15/2020

## 2020-12-15 NOTE — Patient Instructions (Addendum)
Medication Instructions:  lasix '20mg'$  for the next two days  Then take only as needed   *If you need a refill on your cardiac medications before your next appointment, please call your pharmacy*   Lab Work: None  Testing/Procedures: Cardioversion (possible 9/19 with Dr. April Holding) Will call Monday to have date and time confirmed  Follow-Up: At [facility], you and your health needs are our priority.  As part of our continuing mission to provide you with exceptional heart care, we have created designated Provider Care Teams.  These Care Teams include your primary Cardiologist (physician) and Advanced Practice Providers (APPs -  Physician Assistants and Nurse Practitioners) who all work together to provide you with the care you need, when you need it.   Your next appointment:   After cardioversion  The format for your next appointment:   In Person  Provider:   [name], MD or Marrianne Mood, PA-C   Other Instructions  Take the lasix '20mg'$  for the next two days and call the office on Monday to let us know how you feel and if your weight or blood pressure has improved. Let us know your heart rate as well. After those three days, only take your lasix "as needed" or based on your weight (see below).   Sliding scale Lasix: Weigh yourself daily in the Morning. If you gain more than 3 pounds overnight, take 1 Lasix per day and call the office if your weight, BP, or symptoms do not change after trying this for three days. If weight gain is greater than 5 pounds in 2 days: 1 Lasix per day and call the office if your weight, BP, or symptoms do not change after trying this for three days.  We recommend a maximum of 2g sodium per day and 2L total fluid per day. Fluids include coffee, tea, water, and juice.  In addition, we recommend you monitor both your daily weight and daily BP at the same time each day - bring this long into the office.

## 2020-12-15 NOTE — Telephone Encounter (Signed)
Spoke with [name] informing her Dr. Darnell Level is giving verbal orders for services requested.  Expresses her thanks.

## 2020-12-15 NOTE — Progress Notes (Signed)
Office Visit    Patient Name: [name] Date of Encounter: 12/15/2020  PCP:  Ria Bush, [facility]  Cardiologist:  [name], MD  Advanced Practice Provider:  Arvil Chaco, PA-C Electrophysiologist:  None [phone]   Chief Complaint    Chief Complaint  Patient presents with   Other    2 wk f/u c/o sob, edema ankles and fatigue. Meds reviewed verbally with pt.    87 y.o. female with history of atrial flutter/ fibrillation on reduced dose Eliquis 2.5 mg twice daily (see below regarding reduced dosing), HFpEF, pulmonary hypertension, MR/TR/AR, essential hypertension, hyperlipidemia, PAD with recent angiography of tibial disease and underwent angioplasty on bilateral tibial vessels, chronic fatigue, recent 3/30 CT showing vertebral body compression fractures at T5 and L1, and here for follow-up after recent presentation in Afib with SOB and sx of overload.  Past Medical History    Past Medical History:  Diagnosis Date   Abdominal aortic atherosclerosis ([place]) 09/2015   by xray   Anxiety    BCC (basal cell carcinoma of skin) 05/2011   on nose   BCC (basal cell carcinoma of skin) 08/09/2020   upper chest   Breast lesion    a. diagnosed with complex sclerosing lesion with calcifications of the right breast in 08/2016   Chest pain    a. nuclear stress test 03/2012: normal; b. 09/2018 MV: EF >65%, no isch/scar.    DDD (degenerative disc disease), lumbosacral 12/06/1992   Gallstones 09/2015   incidentally by xray   GERD (gastroesophageal reflux disease) 05/1999   HERPES ZOSTER 04/13/2007   Qualifier: Diagnosis of  By: [name], [name]    Hiatal hernia    History of shingles    ophthalmic, takes acyclovir daily   Hyperlipidemia 12/2003   Hypertension 1990   Mitral regurgitation    a. echo 03/2012: EF 60-65%, no RWMA, mild to mod AI/MR, mod TR, PASP 37 mmHg   Osteoarthritis 08/21/1989   Osteoporosis with fracture  11/1997   with compression fracture T12   Persistent atrial fibrillation ([place]) 03/16/2012   a. CHADS2VASc => 5 (HTN, age x 2, vascular disease, sex category)-->Eliquis 5 BID;  b. Recurrent AF in 06/2016;  c. 01/2017 s/p DCCV;  d. 02/2017 recurrent AFib->Amio added->DCCV; e. 03/2017 recurrent AFib, s/p reload of amio and DCCV 04/02/2017   POSTHERPETIC NEURALGIA 04/20/2007   Qualifier: Diagnosis of  By: [name], Billie-Lynn Daniels    Rheumatoid arthritis ([place])    RLS (restless legs syndrome)    Past Surgical History:  Procedure Laterality Date   ABDOMINAL HYSTERECTOMY  Age 68 - 67   S/P TAH   abdominal ultrasound  04/23/2004   Gallstones   BREAST EXCISIONAL BIOPSY Right 2018   neg/benign complex sclerosing lesion   BREAST LUMPECTOMY WITH RADIOACTIVE SEED LOCALIZATION Right 10/2016   breast lumpectomy with radioactive seed localization and margin assessment for complex sclerosing lesion ([place])   BREAST LUMPECTOMY WITH RADIOACTIVE SEED LOCALIZATION Right 11/05/2016   Procedure: RIGHT BREAST LUMPECTOMY WITH RADIOACTIVE SEED LOCALIZATION;  Surgeon: Fanny Skates, MD;  Location: [place];  Service: General;  Laterality: Right;   CARDIAC CATHETERIZATION     CARDIOVERSION N/A 01/31/2017   Procedure: CARDIOVERSION;  Surgeon: Wellington Hampshire, MD;  Location: [facility];  Service: Cardiovascular;  Laterality: N/A;   CARDIOVERSION N/A 03/03/2017   Procedure: CARDIOVERSION;  Surgeon: Wellington Hampshire, MD;  Location: [facility];  Service: Cardiovascular;  Laterality: N/A;   CARDIOVERSION N/A 04/02/2017   Procedure: CARDIOVERSION;  Surgeon: Wellington Hampshire, MD;  Location: [place] [facility];  Service: Cardiovascular;  Laterality: N/A;   CARDIOVERSION N/A 01/16/2018   Procedure: CARDIOVERSION;  Surgeon: Wellington Hampshire, MD;  Location: [facility];  Service: Cardiovascular;  Laterality: N/A;   CATARACT EXTRACTION  05/2010   left eye   CERVICAL DISCECTOMY  1992   Fusion (Dr. Joya Salm)   ERCP  N/A 10/30/2020   Procedure: ENDOSCOPIC RETROGRADE CHOLANGIOPANCREATOGRAPHY (ERCP);  Surgeon: Jackquline Denmark, MD;  Location: [facility];  Service: Endoscopy;  Laterality: N/A;   ESI Bilateral 01/2016   S1 transforaminal ESI x3   ESOPHAGOGASTRODUODENOSCOPY  01/01/2002   with ulcer, bx. neg; + stricture gastric ulcer with hemorrhage   ESOPHAGOGASTRODUODENOSCOPY  04/23/2004   Esophageal stricture -- dilated   INTRAVASCULAR PRESSURE WIRE/FFR STUDY N/A 07/12/2019   Procedure: INTRAVASCULAR PRESSURE WIRE/FFR STUDY;  Surgeon: Wellington Hampshire, MD;  Location: [place] [facility];  Service: Cardiovascular;  Laterality: N/A;   LOWER EXTREMITY ANGIOGRAPHY Left 02/01/2019   LOWER EXTREMITY ANGIOGRAPHY;  Surgeon: Algernon Huxley, MD   LOWER EXTREMITY ANGIOGRAPHY Right 03/25/2019   Procedure: LOWER EXTREMITY ANGIOGRAPHY;  Surgeon: Algernon Huxley, MD;  Location: [place] [facility];  Service: Cardiovascular;  Laterality: Right;   LOWER EXTREMITY ANGIOGRAPHY Right 08/02/2019   Procedure: LOWER EXTREMITY ANGIOGRAPHY;  Surgeon: Algernon Huxley, MD;  Location: [place] [facility];  Service: Cardiovascular;  Laterality: Right;   nuclear stress test  03/2012   no ischemia   PERIPHERAL VASCULAR BALLOON ANGIOPLASTY Left 01/2019   Percutaneous transluminal angioplasty of left anterior and posterior tibial artery with 2.5 mm diameter by 22 cm length angioplasty balloon ([place])   REMOVAL OF STONES  10/30/2020   Procedure: REMOVAL OF STONES;  Surgeon: Jackquline Denmark, MD;  Location: [facility] ENDOSCOPY;  Service: Endoscopy;;   RIGHT/LEFT HEART CATH AND CORONARY ANGIOGRAPHY Bilateral 07/12/2019   Procedure: RIGHT/LEFT HEART CATH AND CORONARY ANGIOGRAPHY;  Surgeon: Wellington Hampshire, MD;  Location: [place] [facility];  Service: Cardiovascular;  Laterality: Bilateral;   SKIN CANCER EXCISION  05/20/2011   BCC from nose   SPHINCTEROTOMY  10/30/2020   Procedure: SPHINCTEROTOMY;  Surgeon: Jackquline Denmark, MD;  Location: [facility] ENDOSCOPY;  Service:  Endoscopy;;   US ECHOCARDIOGRAPHY  03/2012   in flutter, EF 60%, mild-mod aortic, mitral, tricuspid regurg, mildly dilated LA    Allergies  Allergies  Allergen Reactions   Penicillins Rash and Other (See Comments)    Childhood Allergy Has patient had a PCN reaction causing immediate rash, facial/tongue/throat swelling, SOB or lightheadedness with hypotension: Yes Has patient had a PCN reaction causing severe rash involving mucus membranes or skin necrosis: Unknown Has patient had a PCN reaction that required hospitalization: No Has patient had a PCN reaction occurring within the last 10 years: No If all of the above answers are "NO", then may proceed with Cephalosporin use.     History of Present Illness    [name] is a 87 y.o. female with PMH as above. [name] is a 87 y.o. female with PMH as above. She has a history of hearing loss.  She has a known history of persistent atrial fibrillation and flutter maintaining sinus rhythm with amiodarone, chronic dyspnea and fatigue, essential hypertension, hyperlipidemia, PAD, and rheumatoid arthritis.   She underwent angioplasty on bilateral tibial vessels with improvement in symptoms.     05/2019 echo showed nl LVSF, G2DD, severe pulmonary HTN, moderate  MR, moderate TR.   Seen in clinic with ongoing and progressive DOE/fatigue, as well as concern for worsening PAD.  She became significantly short of breath when she attempted to stand during her visit . [facility] appointment was recommended, given following her recent revascularization by [facility], she initially had pink feet that were warm at night but had since turned cyanotic and cold (with continued numbness and weakness).    [facility] 07/12/2019 showed mild to moderate nonobstructive CAD.  The worst stenosis was in the ostium of the RCA, which was calcified and eccentric.  Right heart cardiac catheterization showed mildly elevated left sided filling pressure with PCWP 15 mmHg and mild pulmonary  hypertension at 41/13 mmHg with low normal cardiac output at 4.02 and cardiac index 2.48.  Pulmonary vascular resistance was 2.74 Woods unit.  She was continued on her same dose of furosemide.   In July, she suffered a fracture to her R wrist after a fall. She had AOC renal failure, improving after discontinuing lasix. She had LE arterial angiogram by Dr. Lucky Cowboy with endovascular intervention and tibial vessels bilaterally.    When seen in clinic thereafter, she noted another fall and back discomfort. She continued to note DOE and some swelling in her leg, improving with elevation. She was continued on medical management without a diuretic, though it was noted she could consider lasix 49m once daily if needed. It was noted she could consider Coreg if BP continued to be elevated. She was kept at Eliquis 2.588mBID, given her recurrent falls (as no longer met criteria for reduced dosing). It was noted that there was no clear cardiac explanation for her DOE. PFTs could be considered, though given her chronic pain, it was doubtful she could give a good effort.    When seen in clinic 8/25 by her primary cardiologist, she was noted to be in Afib after maintaining NSR on small dose amiodarone.  She was continued on reduced dose of Eliquis, given her age and weight.  It was noted that atrial fibrillation in the past was associated with significant heart failure, thus return of NSR was stressed.  She is increased amiodarone 200 mg twice daily and Toprol 25 mg twice daily.  BMET and TSH checked and WNL.  Recommendation was to return to clinic in 2 weeks, and if still in atrial fibrillation at that time, repeat DCCV on amiodarone.  The hope is that she would undergo pharmacologic conversion before that time.  Today, 12/15/2020, she returns to clinic and is noted to be in 2-1 atrial flutter with ventricular rate 107 bpm.  She notes extreme fatigue.  In addition, she has shortness of breath, dyspnea, and ankle/pedal edema.   She has been taking her amiodarone 200 mg twice daily and increased dose Toprol.  She brings BP and heart rate log with her with SBP predominantly 150s to 160s with rare SBP 180 and DBP predominantly 80s to 90s with rare DBP 110s.  She reports that her knee feels better but she continues to have ankle swelling, worse on the left, and improving with elevation.  She notes improvement in her appetite and subsequent weight gain.  She denies any chest pain, tachypalpitations, presyncope, syncope's, no recent falls.  No signs or symptoms of bleeding.  She has not missed any doses of anticoagulation.  Weight today noted to be up.  Ventricular rate is slightly improved, as well as BP.  She continues to report a relatively sedentary lifestyle.  DCCV discussed as below.  Home Medications   Current Outpatient Medications  Medication Instructions   acetaminophen (TYLENOL) 500 mg, Oral, Every 6 hours PRN   acyclovir (ZOVIRAX) 400 mg, Oral, 2 times daily,     Alpha-Lipoic Acid 600 MG TABS 1 tablet, Oral, Daily   amiodarone (PACERONE) 200 mg, Oral, 2 times daily   amLODipine (NORVASC) 5 mg, Oral, Daily   apixaban (ELIQUIS) 2.5 MG TABS tablet TAKE 1 TABLET BY MOUTH TWICE DAILY   aspirin EC 81 mg, Oral, Daily, Swallow whole.   atorvastatin (LIPITOR) 20 MG tablet TAKE 1 TABLET BY MOUTH ONCE DAILY   Cyanocobalamin (B-12 COMPLIANCE INJECTION) 1000 MCG/ML KIT Injection, Every 30 days   Docusate Calcium (STOOL SOFTENER PO) Oral, Daily   doxycycline (VIBRA-TABS) 100 mg, Oral, 2 times daily   DULoxetine (CYMBALTA) 60 MG capsule TAKE 1 CAPSULE BY MOUTH ONCE DAILY   gabapentin (NEURONTIN) 400 mg, Oral, Daily at bedtime, With second dose during day as needed   lisinopril (ZESTRIL) 20 mg, Oral, Daily   memantine (NAMENDA) 5 MG tablet TAKE 1 TABLET BY MOUTH TWICE DAILY   metoprolol succinate (TOPROL-XL) 25 mg, Oral, 2 times daily   pantoprazole (PROTONIX) 40 MG tablet TAKE 1 TABLET BY MOUTH ONCE DAILY   polyethylene  glycol (MIRALAX / GLYCOLAX) 17 g, Oral, Daily PRN   prednisoLONE acetate (PRED FORTE) 1 % ophthalmic suspension 1 drop, Left Eye, Daily   rOPINIRole (REQUIP) 1 MG tablet TAKE 2 AND 1/2 TABLETS BY MOUTH AT BEDTIME   silver sulfADIAZINE (SILVADENE) 1 % cream 1 application, Topical, Daily   tiZANidine (ZANAFLEX) 2 mg, Oral, Daily   Vitamin D, Ergocalciferol, (DRISDOL) 1.25 MG (50000 UNIT) CAPS capsule TAKE 1 CAPSULE BY MOUTH ONCE WEEKLY     Review of Systems    She reports SOB, DOE, edema. She denies chest pain, palpitations, pnd, orthopnea, n, v, dizziness, syncope, or early satiety.  She notes improved appetite and attributes weight gain to caloric intake, though noted to Weight of 4 pounds over the last 2 weeks.   All other systems reviewed and are otherwise negative except as noted above.  Physical Exam    VS:  BP 140/78 (BP Location: Right Arm, Patient Position: Sitting, Cuff Size: Normal)   Pulse (!) 107   Ht _0  (1.575 m)   Wt 129 lb 2 oz (58.6 kg)   SpO2 98%   BMI 23.62 kg/m  , BMI Body mass index is 23.62 kg/m. GEN: Well nourished, well developed, in no acute distress. HEENT: normal. Neck: Supple, no JVD, carotid bruits, or masses. Cardiac: tachycardic and irregular, no murmurs, rubs, or gallops. No clubbing, cyanosis.  Mild bilateral edema, worse on the left side.  Chronic stasis dermatitis.  Radials 2+ and equal bilaterally.  Respiratory:  Respirations regular and unlabored, slightly reduced bilateral breath sounds but otherwise clear to auscultation bilaterally. GI: Soft, nontender, nondistended, BS + x 4. MS: no deformity or atrophy. Skin: warm and dry, no rash. Neuro:  Strength and sensation are intact. Psych: Normal affect.  Accessory Clinical Findings    ECG personally reviewed by me today -atrial flutter with 2-1 conduction, 107bpm, TWI inferior leads- no acute changes.  VITALS Reviewed today   Temp Readings from Last 3 Encounters:  11/06/20 98.7 F (37.1 C)  (Temporal)  11/01/20 97.8 F (36.6 C) (Oral)  10/27/20 98.8 F (37.1 C) (Oral)   BP Readings from Last 3 Encounters:  12/15/20 140/78  11/30/20 (!) 160/110  11/06/20 (!) 164/90   Pulse Readings from  Last 3 Encounters:  12/15/20 (!) 107  11/30/20 (!) 128  11/06/20 72    Wt Readings from Last 3 Encounters:  12/15/20 129 lb 2 oz (58.6 kg)  11/30/20 125 lb 6 oz (56.9 kg)  11/06/20 127 lb 2 oz (57.7 kg)     LABS  reviewed today   Lab Results  Component Value Date   WBC 8.0 11/01/2020   HGB 10.4 (L) 11/01/2020   HCT 32.4 (L) 11/01/2020   MCV 102.9 (H) 11/01/2020   PLT 263 11/01/2020   Lab Results  Component Value Date   CREATININE 0.84 11/30/2020   BUN 15 11/30/2020   NA 141 11/30/2020   K 4.0 11/30/2020   CL 102 11/30/2020   CO2 23 11/30/2020   Lab Results  Component Value Date   ALT 71 (H) 11/01/2020   AST 23 11/01/2020   ALKPHOS 151 (H) 11/01/2020   BILITOT 0.6 11/01/2020   Lab Results  Component Value Date   CHOL 133 06/15/2020   HDL 71.70 06/15/2020   LDLCALC 47 06/15/2020   LDLDIRECT 173.1 02/19/2010   TRIG 70.0 06/15/2020   CHOLHDL 2 06/15/2020    No results found for: HGBA1C Lab Results  Component Value Date   TSH 4.440 11/30/2020     STUDIES/PROCEDURES reviewed today   [facility] 07/12/19 Ost LAD lesion is 40% stenosed. Mid LAD lesion is 30% stenosed. The left ventricular systolic function is normal. LV end diastolic pressure is mildly elevated. The left ventricular ejection fraction is 55-65% by visual estimate. Ost RCA to Prox RCA lesion is 50% stenosed 1.  Mild to moderate nonobstructive coronary artery disease.  The worst stenosis was 50% in the ostium of the right coronary artery which was calcified and eccentric.  Fractional flow reserve was done on this lesion and was not significant with an iFR ratio of 0.98. 2.  Normal LV systolic function. 3.  Right heart catheterization showed mildly elevated left-sided filling pressure with pulmonary  capillary wedge pressure 15 mmHg, mild pulmonary hypertension at 41 over 13 mmHg and low normal cardiac output at 4.02 with a cardiac index of 2.48.  Pulmonary vascular resistance is 2.74 Woods unit Recommendations: Recommend medical therapy for nonobstructive coronary artery disease. Volume status appears reasonable and recommend continuing same dose of furosemide. The patient seems to be reasonably optimized from a cardiac standpoint. Avoid catheterization via the right radial artery in the future if needed given small vessel diameter which is diffusely diseased.   3/30 CT IMPRESSION: VASCULAR 1. Mild atherosclerotic disease throughout the aorta without evidence for an aortic aneurysm or dissection. Aortic Atherosclerosis (ICD10-I70.0). 2. No evidence for inflow or outflow disease. 3. Concern for bilateral runoff disease but limited evaluation of the runoff vessels bilaterally. Minimal flow in the distal runoff vessels but this could be related to technical factors and timing of the study. 4. At least mild stenosis involving the main renal arteries. 5. No evidence for a large or central pulmonary embolism. NON-VASCULAR 1. Vertebral body compression fractures at T5 and L1. Age of these fractures are unknown but the fractures could be associated with the patient's pain. 2. Cholelithiasis with distended gallbladder. No evidence for gallbladder inflammation. 3. Large amount of stool in the colon. 4. Moderate sized hiatal hernia. 5. No acute chest abnormality.   07/06/19 Bilateral ABIs appear essentially unchanged compared to prior study on  04/23/2019. Bilateral TBIs appear essentially unchanged compared to prior  study on 1/15/20212.  Summary:  Right: Resting right ankle-brachial index is  within normal range. No  evidence of significant right lower extremity arterial disease. The right  toe-brachial index is abnormal.  Left: Resting left ankle-brachial index is within normal  range. No  evidence of significant left lower extremity arterial disease. The left  toe-brachial index is abnormal.   05/28/19 Echo  1. Left ventricular ejection fraction, by estimation, is 60 to 65%. The  left ventricle has normal function. The left ventricle has no regional  wall motion abnormalities. There is mild left ventricular hypertrophy.  Left ventricular diastolic parameters  are consistent with Grade II diastolic dysfunction (pseudonormalization).   2. Right ventricular systolic function is normal. The right ventricular  size is mildly enlarged. mildly increased right ventricular wall  thickness. There is severely elevated pulmonary artery systolic pressure.   3. Left atrial size was mild to moderately dilated.   4. Right atrial size was mildly dilated.   5. Moderate mitral valve regurgitation.   6. Tricuspid valve regurgitation is moderate.   7. Aortic valve regurgitation is mild to moderate.   8. The inferior vena cava is normal in size with <50% respiratory  variability, suggesting right atrial pressure of 8 mmHg.    NM 09/2018 Normal pharmacologic myocardial perfusion stress test without significant ischemia or scar. The left ventricular ejection fraction is normal (>65%). This is a low risk study. Attenuation correction CT is notable for coronary artery and aortic atherosclerotic calcification, as well as aortic valve and mitral annular calcification. A moderate-sided hiatal hernia is present. Hyperdensity of the liver is noted, which can be seen with long-term amiodarone use. Clinical/laboratory correlation recommended. Splenic calcifications suggest prior granulomatous disease.  Assessment & Plan    Persistent atrial flutter / atrial fibrillation --EKG shows 2:1 atrial flutter with RVR today.  She is asymptomatic in atrial flutter and denies any tachypalpitations.  She is taking amiodarone 200 mg BID and Toprol 25 mg twice daily.  Ventricular rate remains elevated  but under 110 bpm at rest.  Given plan for increased diuresis as below, we will hold off on increasing her Toprol today and reassess via a patient MyChart message on Monday 9/12 with patient and family agreement.  Continue low-dose Eliquis 2.5 mg daily, given age and weight.  We will schedule for DCCV at first available appointment with patient agreement to proceed after review of risks and benefits of the procedure.  Shared Decision Making/Informed Consent The risks (stroke, cardiac arrhythmias rarely resulting in the need for a temporary or permanent pacemaker, skin irritation or burns and complications associated with conscious sedation including aspiration, arrhythmia, respiratory failure and death), benefits (restoration of normal sinus rhythm) and alternatives of a direct current cardioversion were explained in detail to Ms. Bosko and she agrees to proceed.    Chronic diastolic heart failure, Mild pulmonary hypertension Valvular Dz: Moderate MR/TR and mild to moderate AR DOE, SOB --Very slightly volume up, though suspect she will quickly diurese given diuretic nave.  Suspect volume increase in the setting of RVR.  Breathing difficulties likely multifactorial in the setting of volume overload, rapid ventricular rate, deconditioning, and valvular dz. Start lasix 18m daily for at least 2 days then reassess if should continue daily dosing or drop down to as needed based on 3 pound weight gain overnight or 5 pound weight gain in 1 week as discussed in detail today.  The patient will send a MyChart message on Monday to let [place] weights, heart rate, BP, and symptoms.on Monday 9/12, increase BB if needed for rate  control s/p diuresis.  As above, will plan for DCCV at first available appointment.   Essential hypertension --BP suboptimal with plan to increase diuresis as above. Continue current BB, lisinopril for now.   HLD --Continue current statin.     Peripheral arterial disease  --S/p  intervention with further recommendations per vascular surgery.  Continue ASA and statin.  ==============  Medication changes:  --Start Lasix 20 mg x 2 days and reassess on 9/12.   --Based on pt 9/12 MyChart message with vitals, will either proceed with Lasix 20 mg daily thereafter or drop down to as needed/sliding scale dosing after that time.  Will also decide if incr dose beta-blocker needed at that time for ventricular rate control.  Labs ordered: BMET Studies / Imaging ordered: DCCV Future considerations: ?Increase BB after weekend? ?Continue diuresis? (Pt will send MyChart msg) Disposition: RTC after DCCV  *Please be aware that the above documentation was completed voice recognition software and may contain dictation errors.    Arvil Chaco, PA-C 12/15/2020

## 2020-12-17 ENCOUNTER — Encounter: Payer: Self-pay | Admitting: Physician Assistant

## 2020-12-18 ENCOUNTER — Other Ambulatory Visit: Payer: Self-pay | Admitting: Internal Medicine

## 2020-12-18 ENCOUNTER — Other Ambulatory Visit: Payer: Self-pay

## 2020-12-18 ENCOUNTER — Telehealth: Payer: Self-pay

## 2020-12-18 DIAGNOSIS — G9009 Other idiopathic peripheral autonomic neuropathy: Secondary | ICD-10-CM | POA: Diagnosis not present

## 2020-12-18 DIAGNOSIS — Z7982 Long term (current) use of aspirin: Secondary | ICD-10-CM | POA: Diagnosis not present

## 2020-12-18 DIAGNOSIS — I272 Pulmonary hypertension, unspecified: Secondary | ICD-10-CM | POA: Diagnosis not present

## 2020-12-18 DIAGNOSIS — N1831 Chronic kidney disease, stage 3a: Secondary | ICD-10-CM | POA: Diagnosis not present

## 2020-12-18 DIAGNOSIS — E7849 Other hyperlipidemia: Secondary | ICD-10-CM | POA: Diagnosis not present

## 2020-12-18 DIAGNOSIS — E538 Deficiency of other specified B group vitamins: Secondary | ICD-10-CM | POA: Diagnosis not present

## 2020-12-18 DIAGNOSIS — I5032 Chronic diastolic (congestive) heart failure: Secondary | ICD-10-CM | POA: Diagnosis not present

## 2020-12-18 DIAGNOSIS — K219 Gastro-esophageal reflux disease without esophagitis: Secondary | ICD-10-CM | POA: Diagnosis not present

## 2020-12-18 DIAGNOSIS — M1990 Unspecified osteoarthritis, unspecified site: Secondary | ICD-10-CM | POA: Diagnosis not present

## 2020-12-18 DIAGNOSIS — Z01810 Encounter for preprocedural cardiovascular examination: Secondary | ICD-10-CM

## 2020-12-18 DIAGNOSIS — I872 Venous insufficiency (chronic) (peripheral): Secondary | ICD-10-CM | POA: Diagnosis not present

## 2020-12-18 DIAGNOSIS — I2510 Atherosclerotic heart disease of native coronary artery without angina pectoris: Secondary | ICD-10-CM | POA: Diagnosis not present

## 2020-12-18 DIAGNOSIS — I130 Hypertensive heart and chronic kidney disease with heart failure and stage 1 through stage 4 chronic kidney disease, or unspecified chronic kidney disease: Secondary | ICD-10-CM | POA: Diagnosis not present

## 2020-12-18 DIAGNOSIS — Z9181 History of falling: Secondary | ICD-10-CM | POA: Diagnosis not present

## 2020-12-18 DIAGNOSIS — G2581 Restless legs syndrome: Secondary | ICD-10-CM | POA: Diagnosis not present

## 2020-12-18 DIAGNOSIS — I4891 Unspecified atrial fibrillation: Secondary | ICD-10-CM | POA: Diagnosis not present

## 2020-12-18 DIAGNOSIS — M810 Age-related osteoporosis without current pathological fracture: Secondary | ICD-10-CM | POA: Diagnosis not present

## 2020-12-18 DIAGNOSIS — R1310 Dysphagia, unspecified: Secondary | ICD-10-CM | POA: Diagnosis not present

## 2020-12-18 DIAGNOSIS — Z7901 Long term (current) use of anticoagulants: Secondary | ICD-10-CM | POA: Diagnosis not present

## 2020-12-18 DIAGNOSIS — M5136 Other intervertebral disc degeneration, lumbar region: Secondary | ICD-10-CM | POA: Diagnosis not present

## 2020-12-18 NOTE — Telephone Encounter (Signed)
Was able to call pt's daughter, [name] (DPR approved). Confirmed date and time of pt's cardioversion with Dr. Fletcher Anon as discussed at her [place] on Friday 9/9 with Blanche East, PA-C.  Labs 9/15 at 3:30 in office.  Will upload instruction with date and time to pt's MyChart for review.  Daughter very thankful for call, will call back for any further questions.

## 2020-12-19 DIAGNOSIS — I2510 Atherosclerotic heart disease of native coronary artery without angina pectoris: Secondary | ICD-10-CM | POA: Diagnosis not present

## 2020-12-19 DIAGNOSIS — I4891 Unspecified atrial fibrillation: Secondary | ICD-10-CM | POA: Diagnosis not present

## 2020-12-19 DIAGNOSIS — E7849 Other hyperlipidemia: Secondary | ICD-10-CM | POA: Diagnosis not present

## 2020-12-19 DIAGNOSIS — K219 Gastro-esophageal reflux disease without esophagitis: Secondary | ICD-10-CM | POA: Diagnosis not present

## 2020-12-19 DIAGNOSIS — N1831 Chronic kidney disease, stage 3a: Secondary | ICD-10-CM | POA: Diagnosis not present

## 2020-12-19 DIAGNOSIS — G9009 Other idiopathic peripheral autonomic neuropathy: Secondary | ICD-10-CM | POA: Diagnosis not present

## 2020-12-19 DIAGNOSIS — Z7901 Long term (current) use of anticoagulants: Secondary | ICD-10-CM | POA: Diagnosis not present

## 2020-12-19 DIAGNOSIS — I5032 Chronic diastolic (congestive) heart failure: Secondary | ICD-10-CM | POA: Diagnosis not present

## 2020-12-19 DIAGNOSIS — Z7982 Long term (current) use of aspirin: Secondary | ICD-10-CM | POA: Diagnosis not present

## 2020-12-19 DIAGNOSIS — I272 Pulmonary hypertension, unspecified: Secondary | ICD-10-CM | POA: Diagnosis not present

## 2020-12-19 DIAGNOSIS — I872 Venous insufficiency (chronic) (peripheral): Secondary | ICD-10-CM | POA: Diagnosis not present

## 2020-12-19 DIAGNOSIS — G2581 Restless legs syndrome: Secondary | ICD-10-CM | POA: Diagnosis not present

## 2020-12-19 DIAGNOSIS — E538 Deficiency of other specified B group vitamins: Secondary | ICD-10-CM | POA: Diagnosis not present

## 2020-12-19 DIAGNOSIS — R1310 Dysphagia, unspecified: Secondary | ICD-10-CM | POA: Diagnosis not present

## 2020-12-19 DIAGNOSIS — M810 Age-related osteoporosis without current pathological fracture: Secondary | ICD-10-CM | POA: Diagnosis not present

## 2020-12-19 DIAGNOSIS — M5136 Other intervertebral disc degeneration, lumbar region: Secondary | ICD-10-CM | POA: Diagnosis not present

## 2020-12-19 DIAGNOSIS — I130 Hypertensive heart and chronic kidney disease with heart failure and stage 1 through stage 4 chronic kidney disease, or unspecified chronic kidney disease: Secondary | ICD-10-CM | POA: Diagnosis not present

## 2020-12-19 DIAGNOSIS — Z9181 History of falling: Secondary | ICD-10-CM | POA: Diagnosis not present

## 2020-12-19 DIAGNOSIS — M1990 Unspecified osteoarthritis, unspecified site: Secondary | ICD-10-CM | POA: Diagnosis not present

## 2020-12-21 ENCOUNTER — Other Ambulatory Visit (INDEPENDENT_AMBULATORY_CARE_PROVIDER_SITE_OTHER): Payer: Medicare Other

## 2020-12-21 ENCOUNTER — Other Ambulatory Visit: Payer: Self-pay

## 2020-12-21 DIAGNOSIS — Z9181 History of falling: Secondary | ICD-10-CM | POA: Diagnosis not present

## 2020-12-21 DIAGNOSIS — I130 Hypertensive heart and chronic kidney disease with heart failure and stage 1 through stage 4 chronic kidney disease, or unspecified chronic kidney disease: Secondary | ICD-10-CM | POA: Diagnosis not present

## 2020-12-21 DIAGNOSIS — I872 Venous insufficiency (chronic) (peripheral): Secondary | ICD-10-CM | POA: Diagnosis not present

## 2020-12-21 DIAGNOSIS — I4891 Unspecified atrial fibrillation: Secondary | ICD-10-CM | POA: Diagnosis not present

## 2020-12-21 DIAGNOSIS — E538 Deficiency of other specified B group vitamins: Secondary | ICD-10-CM | POA: Diagnosis not present

## 2020-12-21 DIAGNOSIS — Z7982 Long term (current) use of aspirin: Secondary | ICD-10-CM | POA: Diagnosis not present

## 2020-12-21 DIAGNOSIS — Z7901 Long term (current) use of anticoagulants: Secondary | ICD-10-CM | POA: Diagnosis not present

## 2020-12-21 DIAGNOSIS — G9009 Other idiopathic peripheral autonomic neuropathy: Secondary | ICD-10-CM | POA: Diagnosis not present

## 2020-12-21 DIAGNOSIS — I5032 Chronic diastolic (congestive) heart failure: Secondary | ICD-10-CM | POA: Diagnosis not present

## 2020-12-21 DIAGNOSIS — M1990 Unspecified osteoarthritis, unspecified site: Secondary | ICD-10-CM | POA: Diagnosis not present

## 2020-12-21 DIAGNOSIS — E7849 Other hyperlipidemia: Secondary | ICD-10-CM | POA: Diagnosis not present

## 2020-12-21 DIAGNOSIS — G2581 Restless legs syndrome: Secondary | ICD-10-CM | POA: Diagnosis not present

## 2020-12-21 DIAGNOSIS — M5136 Other intervertebral disc degeneration, lumbar region: Secondary | ICD-10-CM | POA: Diagnosis not present

## 2020-12-21 DIAGNOSIS — Z01810 Encounter for preprocedural cardiovascular examination: Secondary | ICD-10-CM | POA: Diagnosis not present

## 2020-12-21 DIAGNOSIS — M810 Age-related osteoporosis without current pathological fracture: Secondary | ICD-10-CM | POA: Diagnosis not present

## 2020-12-21 DIAGNOSIS — I2510 Atherosclerotic heart disease of native coronary artery without angina pectoris: Secondary | ICD-10-CM | POA: Diagnosis not present

## 2020-12-21 DIAGNOSIS — R1310 Dysphagia, unspecified: Secondary | ICD-10-CM | POA: Diagnosis not present

## 2020-12-21 DIAGNOSIS — N1831 Chronic kidney disease, stage 3a: Secondary | ICD-10-CM | POA: Diagnosis not present

## 2020-12-21 DIAGNOSIS — I272 Pulmonary hypertension, unspecified: Secondary | ICD-10-CM | POA: Diagnosis not present

## 2020-12-21 DIAGNOSIS — K219 Gastro-esophageal reflux disease without esophagitis: Secondary | ICD-10-CM | POA: Diagnosis not present

## 2020-12-22 ENCOUNTER — Encounter: Payer: Self-pay | Admitting: Family Medicine

## 2020-12-22 DIAGNOSIS — M810 Age-related osteoporosis without current pathological fracture: Secondary | ICD-10-CM | POA: Diagnosis not present

## 2020-12-22 DIAGNOSIS — M1990 Unspecified osteoarthritis, unspecified site: Secondary | ICD-10-CM | POA: Diagnosis not present

## 2020-12-22 DIAGNOSIS — I4819 Other persistent atrial fibrillation: Secondary | ICD-10-CM

## 2020-12-22 DIAGNOSIS — I130 Hypertensive heart and chronic kidney disease with heart failure and stage 1 through stage 4 chronic kidney disease, or unspecified chronic kidney disease: Secondary | ICD-10-CM | POA: Diagnosis not present

## 2020-12-22 DIAGNOSIS — I272 Pulmonary hypertension, unspecified: Secondary | ICD-10-CM | POA: Diagnosis not present

## 2020-12-22 DIAGNOSIS — Z9181 History of falling: Secondary | ICD-10-CM | POA: Diagnosis not present

## 2020-12-22 DIAGNOSIS — R1310 Dysphagia, unspecified: Secondary | ICD-10-CM | POA: Diagnosis not present

## 2020-12-22 DIAGNOSIS — I5032 Chronic diastolic (congestive) heart failure: Secondary | ICD-10-CM | POA: Diagnosis not present

## 2020-12-22 DIAGNOSIS — G9009 Other idiopathic peripheral autonomic neuropathy: Secondary | ICD-10-CM | POA: Diagnosis not present

## 2020-12-22 DIAGNOSIS — N1831 Chronic kidney disease, stage 3a: Secondary | ICD-10-CM | POA: Diagnosis not present

## 2020-12-22 DIAGNOSIS — Z7901 Long term (current) use of anticoagulants: Secondary | ICD-10-CM | POA: Diagnosis not present

## 2020-12-22 DIAGNOSIS — E7849 Other hyperlipidemia: Secondary | ICD-10-CM | POA: Diagnosis not present

## 2020-12-22 DIAGNOSIS — E538 Deficiency of other specified B group vitamins: Secondary | ICD-10-CM | POA: Diagnosis not present

## 2020-12-22 DIAGNOSIS — I4891 Unspecified atrial fibrillation: Secondary | ICD-10-CM | POA: Diagnosis not present

## 2020-12-22 DIAGNOSIS — I2510 Atherosclerotic heart disease of native coronary artery without angina pectoris: Secondary | ICD-10-CM | POA: Diagnosis not present

## 2020-12-22 DIAGNOSIS — Z7982 Long term (current) use of aspirin: Secondary | ICD-10-CM | POA: Diagnosis not present

## 2020-12-22 DIAGNOSIS — I872 Venous insufficiency (chronic) (peripheral): Secondary | ICD-10-CM | POA: Diagnosis not present

## 2020-12-22 DIAGNOSIS — M5136 Other intervertebral disc degeneration, lumbar region: Secondary | ICD-10-CM | POA: Diagnosis not present

## 2020-12-22 DIAGNOSIS — K219 Gastro-esophageal reflux disease without esophagitis: Secondary | ICD-10-CM | POA: Diagnosis not present

## 2020-12-22 DIAGNOSIS — G2581 Restless legs syndrome: Secondary | ICD-10-CM | POA: Diagnosis not present

## 2020-12-22 LAB — BASIC METABOLIC PANEL
BUN/Creatinine Ratio: 15 (ref 12–28)
BUN: 15 mg/dL (ref 8–27)
CO2: 22 mmol/L (ref 20–29)
Calcium: 9.1 mg/dL (ref 8.7–10.3)
Chloride: 104 mmol/L (ref 96–106)
Creatinine, Ser: 0.97 mg/dL (ref 0.57–1.00)
Glucose: 94 mg/dL (ref 65–99)
Potassium: 4.3 mmol/L (ref 3.5–5.2)
Sodium: 143 mmol/L (ref 134–144)
eGFR: 56 mL/min/{1.73_m2} — ABNORMAL LOW (ref >59)

## 2020-12-22 LAB — CBC
Hematocrit: 38.4 % (ref 34.0–46.6)
Hemoglobin: 13 g/dL (ref 11.1–15.9)
MCH: 32.8 pg (ref 26.6–33.0)
MCHC: 33.9 g/dL (ref 31.5–35.7)
MCV: 97 fL (ref 79–97)
Platelets: 237 10*3/uL (ref 150–450)
RBC: 3.96 x10E6/uL (ref 3.77–5.28)
RDW: 12.4 % (ref 11.7–15.4)
WBC: 7.2 10*3/uL (ref 3.4–10.8)

## 2020-12-22 MED ORDER — METOPROLOL SUCCINATE ER 25 MG PO TB24: 25.0000 mg | ORAL_TABLET | Freq: Three times a day (TID) | ORAL | 1 refills | Status: DC

## 2020-12-25 ENCOUNTER — Telehealth: Payer: Self-pay | Admitting: Family Medicine

## 2020-12-25 ENCOUNTER — Encounter: Payer: Self-pay | Admitting: Cardiovascular Disease

## 2020-12-25 ENCOUNTER — Ambulatory Visit
Admission: RE | Admit: 2020-12-25 | Discharge: 2020-12-25 | Disposition: A | Discharge status: Home / Self Care | Payer: Medicare Other | Attending: Cardiovascular Disease | Admitting: Cardiovascular Disease

## 2020-12-25 ENCOUNTER — Telehealth: Payer: Self-pay

## 2020-12-25 ENCOUNTER — Encounter
Admission: RE | Disposition: A | Discharge status: Home / Self Care | Payer: Self-pay | Source: Home / Self Care | Attending: Cardiovascular Disease

## 2020-12-25 ENCOUNTER — Ambulatory Visit: Payer: Medicare Other | Admitting: Certified Registered Nurse Anesthetist

## 2020-12-25 DIAGNOSIS — E785 Hyperlipidemia, unspecified: Secondary | ICD-10-CM | POA: Diagnosis not present

## 2020-12-25 DIAGNOSIS — I4819 Other persistent atrial fibrillation: Secondary | ICD-10-CM | POA: Insufficient documentation

## 2020-12-25 DIAGNOSIS — Z7982 Long term (current) use of aspirin: Secondary | ICD-10-CM | POA: Insufficient documentation

## 2020-12-25 DIAGNOSIS — I110 Hypertensive heart disease with heart failure: Secondary | ICD-10-CM | POA: Insufficient documentation

## 2020-12-25 DIAGNOSIS — M4854XA Collapsed vertebra, not elsewhere classified, thoracic region, initial encounter for fracture: Secondary | ICD-10-CM | POA: Diagnosis not present

## 2020-12-25 DIAGNOSIS — M4856XA Collapsed vertebra, not elsewhere classified, lumbar region, initial encounter for fracture: Secondary | ICD-10-CM | POA: Insufficient documentation

## 2020-12-25 DIAGNOSIS — Z9861 Coronary angioplasty status: Secondary | ICD-10-CM | POA: Diagnosis not present

## 2020-12-25 DIAGNOSIS — I5032 Chronic diastolic (congestive) heart failure: Secondary | ICD-10-CM | POA: Diagnosis not present

## 2020-12-25 DIAGNOSIS — R5382 Chronic fatigue, unspecified: Secondary | ICD-10-CM | POA: Insufficient documentation

## 2020-12-25 DIAGNOSIS — I4892 Unspecified atrial flutter: Secondary | ICD-10-CM | POA: Insufficient documentation

## 2020-12-25 DIAGNOSIS — K219 Gastro-esophageal reflux disease without esophagitis: Secondary | ICD-10-CM | POA: Diagnosis not present

## 2020-12-25 DIAGNOSIS — I739 Peripheral vascular disease, unspecified: Secondary | ICD-10-CM | POA: Diagnosis not present

## 2020-12-25 DIAGNOSIS — Z79899 Other long term (current) drug therapy: Secondary | ICD-10-CM | POA: Diagnosis not present

## 2020-12-25 DIAGNOSIS — Z87891 Personal history of nicotine dependence: Secondary | ICD-10-CM | POA: Diagnosis not present

## 2020-12-25 DIAGNOSIS — Z880 Allergy status to penicillin: Secondary | ICD-10-CM | POA: Diagnosis not present

## 2020-12-25 DIAGNOSIS — I4891 Unspecified atrial fibrillation: Secondary | ICD-10-CM | POA: Diagnosis not present

## 2020-12-25 DIAGNOSIS — Z7901 Long term (current) use of anticoagulants: Secondary | ICD-10-CM | POA: Diagnosis not present

## 2020-12-25 DIAGNOSIS — I272 Pulmonary hypertension, unspecified: Secondary | ICD-10-CM | POA: Insufficient documentation

## 2020-12-25 HISTORY — PX: CARDIOVERSION: SHX1299

## 2020-12-25 SURGERY — CARDIOVERSION
Anesthesia: General

## 2020-12-25 MED ORDER — SODIUM CHLORIDE 0.9 % IV SOLN
INTRAVENOUS | Status: DC | PRN
Start: 2020-12-25 — End: 2020-12-25

## 2020-12-25 MED ORDER — PROPOFOL 10 MG/ML IV BOLUS
INTRAVENOUS | Status: DC | PRN
  Administered 2020-12-25: 40 mg via INTRAVENOUS

## 2020-12-25 MED ORDER — AMIODARONE HCL 200 MG PO TABS: 200.0000 mg | ORAL_TABLET | Freq: Every day | ORAL | 1 refills | Status: DC

## 2020-12-25 MED ORDER — PHENYLEPHRINE HCL (PRESSORS) 10 MG/ML IV SOLN
INTRAVENOUS | Status: AC
  Filled 2020-12-25: qty 1

## 2020-12-25 MED ORDER — PROPOFOL 10 MG/ML IV BOLUS
INTRAVENOUS | Status: AC
  Filled 2020-12-25: qty 20

## 2020-12-25 NOTE — CV Procedure (Signed)
Cardioversion note: A standard informed consent was obtained. Timeout was performed. The pads were placed in the anterior posterior fashion. The patient was given propofol by the anesthesia team.  Successful cardioversion was performed with a 150 J. The patient converted to sinus rhythm. Pre-and post EKGs were reviewed. The patient tolerated the procedure with no immediate complications.  Recommendations: Continue same medications and follow-up in 2-3 weeks.  

## 2020-12-25 NOTE — Transfer of Care (Signed)
Immediate Anesthesia Transfer of Care Note  [name]  Procedure(s) Performed: CARDIOVERSION  Patient Location: Cath Lab  Anesthesia Type:General  Level of Consciousness: awake, alert  and oriented  Airway & Oxygen Therapy: Patient Spontanous Breathing and Patient connected to nasal cannula oxygen  Post-op Assessment: Report given to RN and Post -op Vital signs reviewed and stable  Post vital signs: Reviewed and stable  Last Vitals:  Vitals Value Taken Time  BP 170/63 12/25/20 0742  Temp    Pulse 47 12/25/20 0744  Resp 15 12/25/20 0743  SpO2 98 % 12/25/20 0744    Last Pain:  Vitals:   12/25/20 0707  TempSrc: Oral  PainSc: 0-No pain         Complications: No notable events documented.

## 2020-12-25 NOTE — Anesthesia Postprocedure Evaluation (Signed)
Anesthesia Post Note  [name]  Procedure(s) Performed: CARDIOVERSION  Patient location during evaluation: [facility] Recovery Anesthesia Type: General Level of consciousness: awake and alert Pain management: pain level controlled Vital Signs Assessment: post-procedure vital signs reviewed and stable Respiratory status: spontaneous breathing, nonlabored ventilation, respiratory function stable and patient connected to nasal cannula oxygen Cardiovascular status: blood pressure returned to baseline and stable Postop Assessment: no apparent nausea or vomiting Anesthetic complications: no   No notable events documented.   Last Vitals:  Vitals:   12/25/20 0815 12/25/20 0830  BP: (!) 180/74 (!) 178/80  Pulse: (!) 52 (!) 51  Resp: 16 15  Temp:    SpO2: 100% 95%    Last Pain:  Vitals:   12/25/20 0830  TempSrc:   PainSc: 0-No pain                 Precious Haws Donnia [place]

## 2020-12-25 NOTE — Telephone Encounter (Signed)
[name] called stating she needs a verbal order for speech therapy for 1 time a week for 2 weeks

## 2020-12-25 NOTE — Telephone Encounter (Signed)
Agree with this. Thank you.  

## 2020-12-25 NOTE — Anesthesia Preprocedure Evaluation (Signed)
Anesthesia Evaluation  Patient identified by MRN, date of birth, ID band Patient awake    Reviewed: Allergy & Precautions, H&P , NPO status , Patient's Chart, lab work & pertinent test results, reviewed documented beta blocker date and time   Airway Mallampati: III  TM Distance: >3 FB Neck ROM: Full    Dental no notable dental hx. (+) Upper Dentures, Lower Dentures, Dental Advisory Given   Pulmonary neg shortness of breath, former smoker,    Pulmonary exam normal breath sounds clear to auscultation       Cardiovascular Exercise Tolerance: Good hypertension, Pt. on medications and Pt. on home beta blockers + Peripheral Vascular Disease  + dysrhythmias Atrial Fibrillation + Valvular Problems/Murmurs MR  Rhythm:Irregular Rate:Normal     Neuro/Psych PSYCHIATRIC DISORDERS Anxiety Depression    GI/Hepatic Neg liver ROS, PUD, GERD  Medicated and Controlled,  Endo/Other  negative endocrine ROS  Renal/GU negative Renal ROS  negative genitourinary   Musculoskeletal  (+) Arthritis , Osteoarthritis,    Abdominal   Peds negative pediatric ROS (+)  Hematology negative hematology ROS (+)   Anesthesia Other Findings Past Medical History: 09/2015: Abdominal aortic atherosclerosis (HCC)     Comment:  by xray No date: Anxiety 05/2011: BCC (basal cell carcinoma of skin)     Comment:  on nose No date: Breast lesion     Comment:  a. diagnosed with complex sclerosing lesion with               calcifications of the right breast in 08/2016 No date: Chest pain     Comment:  a. nuclear stress test 03/2012: normal 12/06/1992: DDD (degenerative disc disease), lumbosacral 09/2015: Gallstones     Comment:  incidentally by xray 05/1999: GERD (gastroesophageal reflux disease) 04/13/2007: HERPES ZOSTER     Comment:  Qualifier: Diagnosis of  By: [name], [name]  No date: History of shingles     Comment:  ophthalmic,  takes acyclovir daily 12/2003: Hyperlipidemia 1990: Hypertension No date: Mitral regurgitation     Comment:  a. echo 03/2012: EF 60-65%, no RWMA, mild to mod AI/MR,               mod TR, PASP 37 mmHg 08/21/1989: Osteoarthritis 11/1997: Osteoporosis with fracture     Comment:  with compression fracture T12 03/16/2012: Persistent atrial fibrillation     Comment:  a. CHADS2VASc => 5 (HTN, age x 2, vascular disease, sex               category)-->Eliquis 5 BID;  b. Recurrent AF in 06/2016;                c. 01/2017 s/p DCCV;  d. 02/2017 recurrent AFib->Amio               added->DCCV; e. 03/2017 recurrent AFib, s/p reload of               amio and DCCV 04/02/2017 No date: Rheumatoid arthritis ([place]) No date: RLS (restless legs syndrome)   Reproductive/Obstetrics negative OB ROS                             Anesthesia Physical  Anesthesia Plan  ASA: III  Anesthesia Plan: General   Post-op Pain Management:    Induction: Intravenous  PONV Risk Score and Plan: 4 or greater and Ondansetron, Dexamethasone and Treatment  may vary due to age or medical condition  Airway Management Planned: Nasal Cannula  Additional Equipment:   Intra-op Plan:   Post-operative Plan:   Informed Consent: I have reviewed the patients History and Physical, chart, labs and discussed the procedure including the risks, benefits and alternatives for the proposed anesthesia with the patient or authorized representative who has indicated his/her understanding and acceptance.     Dental advisory given  Plan Discussed with: CRNA and Surgeon  Anesthesia Plan Comments: (Patient consented for risks of anesthesia including but not limited to:  - adverse reactions to medications - risk of intubation if required - damage to teeth, lips or other oral mucosa - sore throat or hoarseness - Damage to heart, brain, lungs or loss of life  Patient voiced understanding.)        Anesthesia  Quick Evaluation

## 2020-12-25 NOTE — Telephone Encounter (Signed)
Patient's daughter called about up coming procedure with Dr. Laurence Ferrari this week. She just had a cardioversion done today and her cardiologist was okay with her keeping the appointment with [place]. Are you okay with this? She is also on some blood thinners that she can not stop at this time due to what is going on.

## 2020-12-25 NOTE — Anesthesia Procedure Notes (Signed)
Date/Time: 12/25/2020 7:36 AM Performed by: Lily Peer, Emon Miggins, CRNA Pre-anesthesia Checklist: Patient identified, Emergency Drugs available, Suction available, Patient being monitored and Timeout performed Patient Re-evaluated:Patient Re-evaluated prior to induction Oxygen Delivery Method: Nasal cannula Induction Type: IV induction

## 2020-12-25 NOTE — Interval H&P Note (Signed)
History and Physical Interval Note:  12/25/2020 7:41 AM  [name]  has presented today for surgery, with the diagnosis of Cardioversion  Afib.  The various methods of treatment have been discussed with the patient and family. After consideration of risks, benefits and other options for treatment, the patient has consented to  Procedure(s): CARDIOVERSION (N/A) as a surgical intervention.  The patient's history has been reviewed, patient examined, no change in status, stable for surgery.  I have reviewed the patient's chart and labs.  Questions were answered to the patient's satisfaction.     [name]

## 2020-12-25 NOTE — Telephone Encounter (Signed)
Left a message on voicemail for Di Kindle to call the office back.

## 2020-12-26 DIAGNOSIS — N1831 Chronic kidney disease, stage 3a: Secondary | ICD-10-CM | POA: Diagnosis not present

## 2020-12-26 DIAGNOSIS — R1310 Dysphagia, unspecified: Secondary | ICD-10-CM | POA: Diagnosis not present

## 2020-12-26 DIAGNOSIS — I872 Venous insufficiency (chronic) (peripheral): Secondary | ICD-10-CM | POA: Diagnosis not present

## 2020-12-26 DIAGNOSIS — Z7901 Long term (current) use of anticoagulants: Secondary | ICD-10-CM | POA: Diagnosis not present

## 2020-12-26 DIAGNOSIS — K219 Gastro-esophageal reflux disease without esophagitis: Secondary | ICD-10-CM | POA: Diagnosis not present

## 2020-12-26 DIAGNOSIS — I5032 Chronic diastolic (congestive) heart failure: Secondary | ICD-10-CM | POA: Diagnosis not present

## 2020-12-26 DIAGNOSIS — E7849 Other hyperlipidemia: Secondary | ICD-10-CM | POA: Diagnosis not present

## 2020-12-26 DIAGNOSIS — M5136 Other intervertebral disc degeneration, lumbar region: Secondary | ICD-10-CM | POA: Diagnosis not present

## 2020-12-26 DIAGNOSIS — M1990 Unspecified osteoarthritis, unspecified site: Secondary | ICD-10-CM | POA: Diagnosis not present

## 2020-12-26 DIAGNOSIS — I130 Hypertensive heart and chronic kidney disease with heart failure and stage 1 through stage 4 chronic kidney disease, or unspecified chronic kidney disease: Secondary | ICD-10-CM | POA: Diagnosis not present

## 2020-12-26 DIAGNOSIS — G2581 Restless legs syndrome: Secondary | ICD-10-CM | POA: Diagnosis not present

## 2020-12-26 DIAGNOSIS — G9009 Other idiopathic peripheral autonomic neuropathy: Secondary | ICD-10-CM | POA: Diagnosis not present

## 2020-12-26 DIAGNOSIS — Z9181 History of falling: Secondary | ICD-10-CM | POA: Diagnosis not present

## 2020-12-26 DIAGNOSIS — M810 Age-related osteoporosis without current pathological fracture: Secondary | ICD-10-CM | POA: Diagnosis not present

## 2020-12-26 DIAGNOSIS — I4891 Unspecified atrial fibrillation: Secondary | ICD-10-CM | POA: Diagnosis not present

## 2020-12-26 DIAGNOSIS — Z7982 Long term (current) use of aspirin: Secondary | ICD-10-CM | POA: Diagnosis not present

## 2020-12-26 DIAGNOSIS — E538 Deficiency of other specified B group vitamins: Secondary | ICD-10-CM | POA: Diagnosis not present

## 2020-12-26 DIAGNOSIS — I2510 Atherosclerotic heart disease of native coronary artery without angina pectoris: Secondary | ICD-10-CM | POA: Diagnosis not present

## 2020-12-26 DIAGNOSIS — I272 Pulmonary hypertension, unspecified: Secondary | ICD-10-CM | POA: Diagnosis not present

## 2020-12-26 NOTE — Telephone Encounter (Signed)
Di Kindle from [facility] returned call regarding verbal Orders for speech would like a call back #[phone]

## 2020-12-26 NOTE — Telephone Encounter (Signed)
That is fine. She should continue her blood thinners.  I would like to confirm she does not have a pacemaker or defibrillator? Thank you!

## 2020-12-26 NOTE — Telephone Encounter (Signed)
Dr Darnell Level, I wanted to follow up on the patient in regards to the Prolia injection. She has had recent cardioversion and per notes will be having skin removal procedure done soon, do we need to post pone until October or any other recommendations?

## 2020-12-26 NOTE — Telephone Encounter (Signed)
I spoke with Di Kindle nurse with [facility] and per note gave VO for speech therapy 1 x a wk for 2 wks. Di Kindle voiced understanding and nothing further needed.

## 2020-12-26 NOTE — Telephone Encounter (Signed)
Yes plz schedule for the 2nd week of October to give her some time from recent procedures.

## 2020-12-26 NOTE — Telephone Encounter (Signed)
Confirmed with daughter she does not have a pacemaker or defibrillator.   Patient will be here tomorrow.

## 2020-12-27 ENCOUNTER — Encounter: Payer: Self-pay | Admitting: Dermatology

## 2020-12-27 ENCOUNTER — Other Ambulatory Visit: Payer: Self-pay

## 2020-12-27 ENCOUNTER — Ambulatory Visit: Payer: Medicare Other | Admitting: Dermatology

## 2020-12-27 DIAGNOSIS — C44519 Basal cell carcinoma of skin of other part of trunk: Secondary | ICD-10-CM

## 2020-12-27 DIAGNOSIS — Z872 Personal history of diseases of the skin and subcutaneous tissue: Secondary | ICD-10-CM | POA: Diagnosis not present

## 2020-12-27 DIAGNOSIS — L821 Other seborrheic keratosis: Secondary | ICD-10-CM

## 2020-12-27 MED ORDER — MUPIROCIN 2 % EX OINT: 1.0000 "application " | TOPICAL_OINTMENT | Freq: Every day | CUTANEOUS | 0 refills | Status: DC

## 2020-12-27 NOTE — Telephone Encounter (Signed)
Left message for [name] to call me back to let me know if they are ok to go ahead and schedule patient for October or have any other preferences or concerns.

## 2020-12-27 NOTE — Telephone Encounter (Signed)
Cr Cl 36.37 mL/min Labs were done on 12/21/20 Calcium 9.1 normal

## 2020-12-27 NOTE — Patient Instructions (Addendum)
Wound Care Instructions  Cleanse wound gently with soap and water once a day then pat dry with clean gauze. Apply a thing coat of mupirocin over the wound. Do not pick or remove scabs. Do not remove the yellow or white "healing tissue" from the base of the wound.  Cover the wound with fresh, clean, nonstick gauze and secure with paper tape. You may use Band-Aids in place of gauze and tape if the would is small enough, but would recommend trimming much of the tape off as there is often too much. Sometimes Band-Aids can irritate the skin.  You should call the office for your biopsy report after 1 week if you have not already been contacted.  If you experience any problems, such as abnormal amounts of bleeding, swelling, significant bruising, significant pain, or evidence of infection, please call the office immediately.  FOR ADULT SURGERY PATIENTS: If you need something for pain relief you may take 1 extra strength Tylenol (acetaminophen) AND 2 Ibuprofen (200mg  each) together every 4 hours as needed for pain. (do not take these if you are allergic to them or if you have a reason you should not take them.) Typically, you may only need pain medication for 1 to 3 days.    If you have any questions or concerns for your doctor, please call our main line at [phone] and press option 4 to reach your doctor's medical assistant. If no one answers, please leave a voicemail as directed and we will return your call as soon as possible. Messages left after 4 pm will be answered the following business day.   You may also send [place] a message via [place]. We typically respond to MyChart messages within 1-2 business days.  For prescription refills, please ask your pharmacy to contact our office. Our fax number is [phone].  If you have an urgent issue when the clinic is closed that cannot wait until the next business day, you can page your doctor at the number below.    Please note that while we do our best  to be available for urgent issues outside of office hours, we are not available 24/7.   If you have an urgent issue and are unable to reach [place], you may choose to seek medical care at your doctor's office, retail clinic, urgent care center, or emergency room.  If you have a medical emergency, please immediately call 911 or go to the emergency department.  Pager Numbers  - Dr. Nehemiah Massed: [phone]  - Dr. Laurence Ferrari: [phone]  - Dr. Nicole Kindred: [phone]  In the event of inclement weather, please call our main line at [phone] for an update on the status of any delays or closures.  Dermatology Medication Tips: Please keep the boxes that topical medications come in in order to help keep track of the instructions about where and how to use these. Pharmacies typically print the medication instructions only on the boxes and not directly on the medication tubes.   If your medication is too expensive, please contact our office at [phone] option 4 or send [place] a message through [place].   We are unable to tell what your co-pay for medications will be in advance as this is different depending on your insurance coverage. However, we may be able to find a substitute medication at lower cost or fill out paperwork to get insurance to cover a needed medication.   If a prior authorization is required to get your medication covered by your insurance company, please allow  [place] 1-2 business days to complete this process.  Drug prices often vary depending on where the prescription is filled and some pharmacies may offer cheaper prices.  The website www.goodrx.com contains coupons for medications through different pharmacies. The prices here do not account for what the cost may be with help from insurance (it may be cheaper with your insurance), but the website can give you the price if you did not use any insurance.  - You can print the associated coupon and take it with your prescription to the  pharmacy.  - You may also stop by our office during regular business hours and pick up a GoodRx coupon card.  - If you need your prescription sent electronically to a different pharmacy, notify our office through [facility] or by phone at [phone] option 4.

## 2020-12-27 NOTE — Progress Notes (Signed)
   Follow-Up Visit   Subjective  [name] is a 87 y.o. female who presents for the following: Procedure (Patient here today for excision of bx proven BCC at upper chest. ).  Patient accompanied by daughter.   The following portions of the chart were reviewed this encounter and updated as appropriate:   Tobacco  Allergies  Meds  Problems  Med Hx  Surg Hx  Fam Hx      Review of Systems:  No other skin or systemic complaints except as noted in HPI or Assessment and Plan.  Objective  Well appearing patient in no apparent distress; mood and affect are within normal limits.  A focused examination was performed including face, chest. Relevant physical exam findings are noted in the Assessment and Plan.  upper chest Firm pink papule   Assessment & Plan  Basal cell carcinoma (BCC) of skin of other part of torso upper chest  Destruction of lesion  Destruction method: electrodesiccation and curettage   Informed consent: discussed and consent obtained   Timeout:  patient name, date of birth, surgical site, and procedure verified Patient was prepped and draped in usual sterile fashion: area prepped with isopropyl alcohol. Anesthesia: the lesion was anesthetized in a standard fashion   Anesthetic:  1% lidocaine w/ epinephrine 1-100,000 buffered w/ 8.4% NaHCO3 Curettage performed in three different directions: Yes   Electrodesiccation performed over the curetted area: Yes   Curettage cycles:  3 Final wound size (cm):  1.1 Hemostasis achieved with:  electrodesiccation Outcome: patient tolerated procedure well with no complications   Post-procedure details: wound care instructions given   Additional details:  Mupirocin and a pressure dressing applied  mupirocin ointment (BACTROBAN) 2 % Apply 1 application topically daily. With dressing changes  Discussed treatment options at length including Mohs (98-99% cure rate), Excision (typically 92-93% cure rate but could be lower  with infiltrative type), ED&C (typically 85% cure rate but likely lower with infiltrative type). Discussed if it came back, we could cut it out then. Discussed rare risk of invasion into bone if it recurs and does not get treated in a timely fashion and incredibly rare risk of metastasis if this recurred and grew extremely very large and was never treated. Discussed typically these are slow growing and it's extremely uncommon for these to become life threatening.  Patient has had health issues with Afib and cardioversion in the last several days.  They prefer ED&C today.  History of PreCancerous Actinic Keratosis  - site(s) of PreCancerous Actinic Keratosis clear today. - these may recur and new lesions may form requiring treatment to prevent transformation into skin cancer - observe for new or changing spots and contact [facility] for appointment if occur - photoprotection with sun protective clothing; sunglasses and broad spectrum sunscreen with SPF of at least 30 + and frequent self skin exams recommended - yearly exams by a dermatologist recommended for persons with history of PreCancerous Actinic Keratoses  Seborrheic Keratoses - Stuck-on, waxy, tan-brown papules and/or plaques  - Benign-appearing - Discussed benign etiology and prognosis. - Observe - Call for any changes   Return for TBSE 2-3 months.  Graciella Belton, RMA, am acting as scribe for Forest Gleason, MD .  Documentation: I have reviewed the above documentation for accuracy and completeness, and I agree with the above.  Forest Gleason, MD

## 2020-12-28 DIAGNOSIS — E538 Deficiency of other specified B group vitamins: Secondary | ICD-10-CM | POA: Diagnosis not present

## 2020-12-28 DIAGNOSIS — Z7982 Long term (current) use of aspirin: Secondary | ICD-10-CM | POA: Diagnosis not present

## 2020-12-28 DIAGNOSIS — N1831 Chronic kidney disease, stage 3a: Secondary | ICD-10-CM | POA: Diagnosis not present

## 2020-12-28 DIAGNOSIS — G2581 Restless legs syndrome: Secondary | ICD-10-CM | POA: Diagnosis not present

## 2020-12-28 DIAGNOSIS — I2510 Atherosclerotic heart disease of native coronary artery without angina pectoris: Secondary | ICD-10-CM | POA: Diagnosis not present

## 2020-12-28 DIAGNOSIS — M810 Age-related osteoporosis without current pathological fracture: Secondary | ICD-10-CM | POA: Diagnosis not present

## 2020-12-28 DIAGNOSIS — E7849 Other hyperlipidemia: Secondary | ICD-10-CM | POA: Diagnosis not present

## 2020-12-28 DIAGNOSIS — Z7901 Long term (current) use of anticoagulants: Secondary | ICD-10-CM | POA: Diagnosis not present

## 2020-12-28 DIAGNOSIS — I5032 Chronic diastolic (congestive) heart failure: Secondary | ICD-10-CM | POA: Diagnosis not present

## 2020-12-28 DIAGNOSIS — I4891 Unspecified atrial fibrillation: Secondary | ICD-10-CM | POA: Diagnosis not present

## 2020-12-28 DIAGNOSIS — Z9181 History of falling: Secondary | ICD-10-CM | POA: Diagnosis not present

## 2020-12-28 DIAGNOSIS — K219 Gastro-esophageal reflux disease without esophagitis: Secondary | ICD-10-CM | POA: Diagnosis not present

## 2020-12-28 DIAGNOSIS — I272 Pulmonary hypertension, unspecified: Secondary | ICD-10-CM | POA: Diagnosis not present

## 2020-12-28 DIAGNOSIS — R1310 Dysphagia, unspecified: Secondary | ICD-10-CM | POA: Diagnosis not present

## 2020-12-28 DIAGNOSIS — G9009 Other idiopathic peripheral autonomic neuropathy: Secondary | ICD-10-CM | POA: Diagnosis not present

## 2020-12-28 DIAGNOSIS — M1990 Unspecified osteoarthritis, unspecified site: Secondary | ICD-10-CM | POA: Diagnosis not present

## 2020-12-28 DIAGNOSIS — I130 Hypertensive heart and chronic kidney disease with heart failure and stage 1 through stage 4 chronic kidney disease, or unspecified chronic kidney disease: Secondary | ICD-10-CM | POA: Diagnosis not present

## 2020-12-28 DIAGNOSIS — I872 Venous insufficiency (chronic) (peripheral): Secondary | ICD-10-CM | POA: Diagnosis not present

## 2020-12-28 DIAGNOSIS — M5136 Other intervertebral disc degeneration, lumbar region: Secondary | ICD-10-CM | POA: Diagnosis not present

## 2020-12-29 DIAGNOSIS — E538 Deficiency of other specified B group vitamins: Secondary | ICD-10-CM | POA: Diagnosis not present

## 2020-12-29 DIAGNOSIS — K219 Gastro-esophageal reflux disease without esophagitis: Secondary | ICD-10-CM | POA: Diagnosis not present

## 2020-12-29 DIAGNOSIS — M5136 Other intervertebral disc degeneration, lumbar region: Secondary | ICD-10-CM | POA: Diagnosis not present

## 2020-12-29 DIAGNOSIS — M1990 Unspecified osteoarthritis, unspecified site: Secondary | ICD-10-CM | POA: Diagnosis not present

## 2020-12-29 DIAGNOSIS — M810 Age-related osteoporosis without current pathological fracture: Secondary | ICD-10-CM | POA: Diagnosis not present

## 2020-12-29 DIAGNOSIS — I872 Venous insufficiency (chronic) (peripheral): Secondary | ICD-10-CM | POA: Diagnosis not present

## 2020-12-29 DIAGNOSIS — G2581 Restless legs syndrome: Secondary | ICD-10-CM | POA: Diagnosis not present

## 2020-12-29 DIAGNOSIS — I5032 Chronic diastolic (congestive) heart failure: Secondary | ICD-10-CM | POA: Diagnosis not present

## 2020-12-29 DIAGNOSIS — I4891 Unspecified atrial fibrillation: Secondary | ICD-10-CM | POA: Diagnosis not present

## 2020-12-29 DIAGNOSIS — N1831 Chronic kidney disease, stage 3a: Secondary | ICD-10-CM | POA: Diagnosis not present

## 2020-12-29 DIAGNOSIS — I2510 Atherosclerotic heart disease of native coronary artery without angina pectoris: Secondary | ICD-10-CM | POA: Diagnosis not present

## 2020-12-29 DIAGNOSIS — R1310 Dysphagia, unspecified: Secondary | ICD-10-CM | POA: Diagnosis not present

## 2020-12-29 DIAGNOSIS — Z7901 Long term (current) use of anticoagulants: Secondary | ICD-10-CM | POA: Diagnosis not present

## 2020-12-29 DIAGNOSIS — Z7982 Long term (current) use of aspirin: Secondary | ICD-10-CM | POA: Diagnosis not present

## 2020-12-29 DIAGNOSIS — I130 Hypertensive heart and chronic kidney disease with heart failure and stage 1 through stage 4 chronic kidney disease, or unspecified chronic kidney disease: Secondary | ICD-10-CM | POA: Diagnosis not present

## 2020-12-29 DIAGNOSIS — G9009 Other idiopathic peripheral autonomic neuropathy: Secondary | ICD-10-CM | POA: Diagnosis not present

## 2020-12-29 DIAGNOSIS — E7849 Other hyperlipidemia: Secondary | ICD-10-CM | POA: Diagnosis not present

## 2020-12-29 DIAGNOSIS — I272 Pulmonary hypertension, unspecified: Secondary | ICD-10-CM | POA: Diagnosis not present

## 2020-12-29 DIAGNOSIS — Z9181 History of falling: Secondary | ICD-10-CM | POA: Diagnosis not present

## 2021-01-01 DIAGNOSIS — I5032 Chronic diastolic (congestive) heart failure: Secondary | ICD-10-CM | POA: Diagnosis not present

## 2021-01-01 DIAGNOSIS — Z7982 Long term (current) use of aspirin: Secondary | ICD-10-CM | POA: Diagnosis not present

## 2021-01-01 DIAGNOSIS — G2581 Restless legs syndrome: Secondary | ICD-10-CM | POA: Diagnosis not present

## 2021-01-01 DIAGNOSIS — M1990 Unspecified osteoarthritis, unspecified site: Secondary | ICD-10-CM | POA: Diagnosis not present

## 2021-01-01 DIAGNOSIS — I2510 Atherosclerotic heart disease of native coronary artery without angina pectoris: Secondary | ICD-10-CM | POA: Diagnosis not present

## 2021-01-01 DIAGNOSIS — G9009 Other idiopathic peripheral autonomic neuropathy: Secondary | ICD-10-CM | POA: Diagnosis not present

## 2021-01-01 DIAGNOSIS — N1831 Chronic kidney disease, stage 3a: Secondary | ICD-10-CM | POA: Diagnosis not present

## 2021-01-01 DIAGNOSIS — E7849 Other hyperlipidemia: Secondary | ICD-10-CM | POA: Diagnosis not present

## 2021-01-01 DIAGNOSIS — I4891 Unspecified atrial fibrillation: Secondary | ICD-10-CM | POA: Diagnosis not present

## 2021-01-01 DIAGNOSIS — Z7901 Long term (current) use of anticoagulants: Secondary | ICD-10-CM | POA: Diagnosis not present

## 2021-01-01 DIAGNOSIS — M5136 Other intervertebral disc degeneration, lumbar region: Secondary | ICD-10-CM | POA: Diagnosis not present

## 2021-01-01 DIAGNOSIS — I872 Venous insufficiency (chronic) (peripheral): Secondary | ICD-10-CM | POA: Diagnosis not present

## 2021-01-01 DIAGNOSIS — Z9181 History of falling: Secondary | ICD-10-CM | POA: Diagnosis not present

## 2021-01-01 DIAGNOSIS — R1310 Dysphagia, unspecified: Secondary | ICD-10-CM | POA: Diagnosis not present

## 2021-01-01 DIAGNOSIS — E538 Deficiency of other specified B group vitamins: Secondary | ICD-10-CM | POA: Diagnosis not present

## 2021-01-01 DIAGNOSIS — I272 Pulmonary hypertension, unspecified: Secondary | ICD-10-CM | POA: Diagnosis not present

## 2021-01-01 DIAGNOSIS — I130 Hypertensive heart and chronic kidney disease with heart failure and stage 1 through stage 4 chronic kidney disease, or unspecified chronic kidney disease: Secondary | ICD-10-CM | POA: Diagnosis not present

## 2021-01-01 DIAGNOSIS — K219 Gastro-esophageal reflux disease without esophagitis: Secondary | ICD-10-CM | POA: Diagnosis not present

## 2021-01-01 DIAGNOSIS — M810 Age-related osteoporosis without current pathological fracture: Secondary | ICD-10-CM | POA: Diagnosis not present

## 2021-01-01 NOTE — Telephone Encounter (Signed)
Spoke with [name]. She understands the benefits of getting Prolia injection and the reason but she is wanting to know if Dr Darnell Level thinks this is a necessity for the patient at this age and stage of her health conditions/care? [name] is looking at quality vs quantity of life for the patient. Would like to get Dr Synthia Innocent feedback on this -pros/cons/  Also, last B12 injection I saw in the chart was in July 2022, [name] wanted to know if patient needs this injection now? Monthly still? Does patient need to get labs ahead of time?

## 2021-01-02 DIAGNOSIS — M5136 Other intervertebral disc degeneration, lumbar region: Secondary | ICD-10-CM | POA: Diagnosis not present

## 2021-01-02 DIAGNOSIS — N1831 Chronic kidney disease, stage 3a: Secondary | ICD-10-CM | POA: Diagnosis not present

## 2021-01-02 DIAGNOSIS — I130 Hypertensive heart and chronic kidney disease with heart failure and stage 1 through stage 4 chronic kidney disease, or unspecified chronic kidney disease: Secondary | ICD-10-CM | POA: Diagnosis not present

## 2021-01-02 DIAGNOSIS — E538 Deficiency of other specified B group vitamins: Secondary | ICD-10-CM | POA: Diagnosis not present

## 2021-01-02 DIAGNOSIS — I2510 Atherosclerotic heart disease of native coronary artery without angina pectoris: Secondary | ICD-10-CM | POA: Diagnosis not present

## 2021-01-02 DIAGNOSIS — M810 Age-related osteoporosis without current pathological fracture: Secondary | ICD-10-CM | POA: Diagnosis not present

## 2021-01-02 DIAGNOSIS — I272 Pulmonary hypertension, unspecified: Secondary | ICD-10-CM | POA: Diagnosis not present

## 2021-01-02 DIAGNOSIS — Z9181 History of falling: Secondary | ICD-10-CM | POA: Diagnosis not present

## 2021-01-02 DIAGNOSIS — R1310 Dysphagia, unspecified: Secondary | ICD-10-CM | POA: Diagnosis not present

## 2021-01-02 DIAGNOSIS — E7849 Other hyperlipidemia: Secondary | ICD-10-CM | POA: Diagnosis not present

## 2021-01-02 DIAGNOSIS — Z7901 Long term (current) use of anticoagulants: Secondary | ICD-10-CM | POA: Diagnosis not present

## 2021-01-02 DIAGNOSIS — I5032 Chronic diastolic (congestive) heart failure: Secondary | ICD-10-CM | POA: Diagnosis not present

## 2021-01-02 DIAGNOSIS — G9009 Other idiopathic peripheral autonomic neuropathy: Secondary | ICD-10-CM | POA: Diagnosis not present

## 2021-01-02 DIAGNOSIS — M1990 Unspecified osteoarthritis, unspecified site: Secondary | ICD-10-CM | POA: Diagnosis not present

## 2021-01-02 DIAGNOSIS — I872 Venous insufficiency (chronic) (peripheral): Secondary | ICD-10-CM | POA: Diagnosis not present

## 2021-01-02 DIAGNOSIS — G2581 Restless legs syndrome: Secondary | ICD-10-CM | POA: Diagnosis not present

## 2021-01-02 DIAGNOSIS — K219 Gastro-esophageal reflux disease without esophagitis: Secondary | ICD-10-CM | POA: Diagnosis not present

## 2021-01-02 DIAGNOSIS — I4891 Unspecified atrial fibrillation: Secondary | ICD-10-CM | POA: Diagnosis not present

## 2021-01-02 DIAGNOSIS — Z7982 Long term (current) use of aspirin: Secondary | ICD-10-CM | POA: Diagnosis not present

## 2021-01-04 NOTE — Telephone Encounter (Addendum)
How has she been doing these last 2 months since I last saw her? Has home health care ended?   Due to difficulty getting into office, plan was to do daily oral b12 replacement starting in August 2022 and recheck b12 levels at next lab visit.   Regarding prolia, I think she does still benefit from injection - however could be reasonable to stop with comorbidities. Decision up to them and if decided to stop, would want them to be aware of increased risk of fracture.   What does palliative care think?

## 2021-01-05 DIAGNOSIS — E7849 Other hyperlipidemia: Secondary | ICD-10-CM | POA: Diagnosis not present

## 2021-01-05 DIAGNOSIS — Z7982 Long term (current) use of aspirin: Secondary | ICD-10-CM | POA: Diagnosis not present

## 2021-01-05 DIAGNOSIS — K219 Gastro-esophageal reflux disease without esophagitis: Secondary | ICD-10-CM | POA: Diagnosis not present

## 2021-01-05 DIAGNOSIS — I4891 Unspecified atrial fibrillation: Secondary | ICD-10-CM | POA: Diagnosis not present

## 2021-01-05 DIAGNOSIS — M5136 Other intervertebral disc degeneration, lumbar region: Secondary | ICD-10-CM | POA: Diagnosis not present

## 2021-01-05 DIAGNOSIS — I5032 Chronic diastolic (congestive) heart failure: Secondary | ICD-10-CM | POA: Diagnosis not present

## 2021-01-05 DIAGNOSIS — R1310 Dysphagia, unspecified: Secondary | ICD-10-CM | POA: Diagnosis not present

## 2021-01-05 DIAGNOSIS — I872 Venous insufficiency (chronic) (peripheral): Secondary | ICD-10-CM | POA: Diagnosis not present

## 2021-01-05 DIAGNOSIS — G9009 Other idiopathic peripheral autonomic neuropathy: Secondary | ICD-10-CM | POA: Diagnosis not present

## 2021-01-05 DIAGNOSIS — M1990 Unspecified osteoarthritis, unspecified site: Secondary | ICD-10-CM | POA: Diagnosis not present

## 2021-01-05 DIAGNOSIS — G2581 Restless legs syndrome: Secondary | ICD-10-CM | POA: Diagnosis not present

## 2021-01-05 DIAGNOSIS — N1831 Chronic kidney disease, stage 3a: Secondary | ICD-10-CM | POA: Diagnosis not present

## 2021-01-05 DIAGNOSIS — Z7901 Long term (current) use of anticoagulants: Secondary | ICD-10-CM | POA: Diagnosis not present

## 2021-01-05 DIAGNOSIS — M810 Age-related osteoporosis without current pathological fracture: Secondary | ICD-10-CM | POA: Diagnosis not present

## 2021-01-05 DIAGNOSIS — I130 Hypertensive heart and chronic kidney disease with heart failure and stage 1 through stage 4 chronic kidney disease, or unspecified chronic kidney disease: Secondary | ICD-10-CM | POA: Diagnosis not present

## 2021-01-05 DIAGNOSIS — I272 Pulmonary hypertension, unspecified: Secondary | ICD-10-CM | POA: Diagnosis not present

## 2021-01-05 DIAGNOSIS — E538 Deficiency of other specified B group vitamins: Secondary | ICD-10-CM | POA: Diagnosis not present

## 2021-01-05 DIAGNOSIS — Z9181 History of falling: Secondary | ICD-10-CM | POA: Diagnosis not present

## 2021-01-05 DIAGNOSIS — I2510 Atherosclerotic heart disease of native coronary artery without angina pectoris: Secondary | ICD-10-CM | POA: Diagnosis not present

## 2021-01-07 DIAGNOSIS — K219 Gastro-esophageal reflux disease without esophagitis: Secondary | ICD-10-CM | POA: Diagnosis not present

## 2021-01-07 DIAGNOSIS — N1831 Chronic kidney disease, stage 3a: Secondary | ICD-10-CM | POA: Diagnosis not present

## 2021-01-07 DIAGNOSIS — Z7982 Long term (current) use of aspirin: Secondary | ICD-10-CM | POA: Diagnosis not present

## 2021-01-07 DIAGNOSIS — Z9181 History of falling: Secondary | ICD-10-CM | POA: Diagnosis not present

## 2021-01-07 DIAGNOSIS — E538 Deficiency of other specified B group vitamins: Secondary | ICD-10-CM | POA: Diagnosis not present

## 2021-01-07 DIAGNOSIS — I4891 Unspecified atrial fibrillation: Secondary | ICD-10-CM | POA: Diagnosis not present

## 2021-01-07 DIAGNOSIS — M1990 Unspecified osteoarthritis, unspecified site: Secondary | ICD-10-CM | POA: Diagnosis not present

## 2021-01-07 DIAGNOSIS — E7849 Other hyperlipidemia: Secondary | ICD-10-CM | POA: Diagnosis not present

## 2021-01-07 DIAGNOSIS — I272 Pulmonary hypertension, unspecified: Secondary | ICD-10-CM | POA: Diagnosis not present

## 2021-01-07 DIAGNOSIS — M810 Age-related osteoporosis without current pathological fracture: Secondary | ICD-10-CM | POA: Diagnosis not present

## 2021-01-07 DIAGNOSIS — I2510 Atherosclerotic heart disease of native coronary artery without angina pectoris: Secondary | ICD-10-CM | POA: Diagnosis not present

## 2021-01-07 DIAGNOSIS — I130 Hypertensive heart and chronic kidney disease with heart failure and stage 1 through stage 4 chronic kidney disease, or unspecified chronic kidney disease: Secondary | ICD-10-CM | POA: Diagnosis not present

## 2021-01-07 DIAGNOSIS — M5136 Other intervertebral disc degeneration, lumbar region: Secondary | ICD-10-CM | POA: Diagnosis not present

## 2021-01-07 DIAGNOSIS — Z7901 Long term (current) use of anticoagulants: Secondary | ICD-10-CM | POA: Diagnosis not present

## 2021-01-07 DIAGNOSIS — I5032 Chronic diastolic (congestive) heart failure: Secondary | ICD-10-CM | POA: Diagnosis not present

## 2021-01-07 DIAGNOSIS — G9009 Other idiopathic peripheral autonomic neuropathy: Secondary | ICD-10-CM | POA: Diagnosis not present

## 2021-01-07 DIAGNOSIS — R1310 Dysphagia, unspecified: Secondary | ICD-10-CM | POA: Diagnosis not present

## 2021-01-07 DIAGNOSIS — I872 Venous insufficiency (chronic) (peripheral): Secondary | ICD-10-CM | POA: Diagnosis not present

## 2021-01-07 DIAGNOSIS — G2581 Restless legs syndrome: Secondary | ICD-10-CM | POA: Diagnosis not present

## 2021-01-08 DIAGNOSIS — I872 Venous insufficiency (chronic) (peripheral): Secondary | ICD-10-CM | POA: Diagnosis not present

## 2021-01-08 DIAGNOSIS — I272 Pulmonary hypertension, unspecified: Secondary | ICD-10-CM | POA: Diagnosis not present

## 2021-01-08 DIAGNOSIS — M810 Age-related osteoporosis without current pathological fracture: Secondary | ICD-10-CM | POA: Diagnosis not present

## 2021-01-08 DIAGNOSIS — R1310 Dysphagia, unspecified: Secondary | ICD-10-CM | POA: Diagnosis not present

## 2021-01-08 DIAGNOSIS — I5032 Chronic diastolic (congestive) heart failure: Secondary | ICD-10-CM | POA: Diagnosis not present

## 2021-01-08 DIAGNOSIS — K219 Gastro-esophageal reflux disease without esophagitis: Secondary | ICD-10-CM | POA: Diagnosis not present

## 2021-01-08 DIAGNOSIS — I130 Hypertensive heart and chronic kidney disease with heart failure and stage 1 through stage 4 chronic kidney disease, or unspecified chronic kidney disease: Secondary | ICD-10-CM | POA: Diagnosis not present

## 2021-01-08 DIAGNOSIS — M5136 Other intervertebral disc degeneration, lumbar region: Secondary | ICD-10-CM | POA: Diagnosis not present

## 2021-01-08 DIAGNOSIS — M1990 Unspecified osteoarthritis, unspecified site: Secondary | ICD-10-CM | POA: Diagnosis not present

## 2021-01-08 DIAGNOSIS — N1831 Chronic kidney disease, stage 3a: Secondary | ICD-10-CM | POA: Diagnosis not present

## 2021-01-08 DIAGNOSIS — E538 Deficiency of other specified B group vitamins: Secondary | ICD-10-CM | POA: Diagnosis not present

## 2021-01-08 DIAGNOSIS — Z9181 History of falling: Secondary | ICD-10-CM | POA: Diagnosis not present

## 2021-01-08 DIAGNOSIS — E7849 Other hyperlipidemia: Secondary | ICD-10-CM | POA: Diagnosis not present

## 2021-01-08 DIAGNOSIS — Z7901 Long term (current) use of anticoagulants: Secondary | ICD-10-CM | POA: Diagnosis not present

## 2021-01-08 DIAGNOSIS — I4891 Unspecified atrial fibrillation: Secondary | ICD-10-CM | POA: Diagnosis not present

## 2021-01-08 DIAGNOSIS — I2510 Atherosclerotic heart disease of native coronary artery without angina pectoris: Secondary | ICD-10-CM | POA: Diagnosis not present

## 2021-01-08 DIAGNOSIS — G9009 Other idiopathic peripheral autonomic neuropathy: Secondary | ICD-10-CM | POA: Diagnosis not present

## 2021-01-08 DIAGNOSIS — Z7982 Long term (current) use of aspirin: Secondary | ICD-10-CM | POA: Diagnosis not present

## 2021-01-08 DIAGNOSIS — G2581 Restless legs syndrome: Secondary | ICD-10-CM | POA: Diagnosis not present

## 2021-01-08 NOTE — Telephone Encounter (Signed)
Spoke with Patricia-patient's daughter, advised of everything. Proceeding with Prolia injections at this time-scheduled 01/23/21. [name] states Home care ended today 01/08/21. Palliative care provider suppose to come to see patient in November and [name] will speak with them about things also.   [name] was advised that it looked like Dr Darnell Level wanted to see patient for follow up in November. [name] would like to know Dr Synthia Innocent thoughts on what patient needs to be caught up on like Flu shot, Shingrix, Pneumonia vaccine? Should patient get the new Covid booster vaccine with everything that patient has had happening recently? Any vaccines that patient cant get done when she gets Prolia on 10/18? (Not sure if anything can be co administer at the same time)  [name] wanted to let Dr Darnell Level know that the cardioversion went well and patient's b/p has been great but have notived heart rate has been increasing again- 101, 102 and 103 the last 2 to 3 days. Patient has follow up with cardiologist on 01/12/21 to discuss this.

## 2021-01-09 NOTE — Telephone Encounter (Addendum)
Glad to hear cardioversion went well.  Would offer OV this month to go over everything instead of waiting to next month (no appt scheduled yet).  She should be able to get flu shot when she comes in for prolia.  She is eligible for Shingrix - but would have to be through pharmacy.  Regarding new bivalent COVID booster, reasonable to get, would wait a bit out from cardioversion prior to receiving (maybe 1 month).

## 2021-01-10 ENCOUNTER — Telehealth: Payer: Self-pay

## 2021-01-10 NOTE — Telephone Encounter (Signed)
ERROR

## 2021-01-11 NOTE — Telephone Encounter (Signed)
Spoke with pt's daughter, [name] (on dpr), relaying Dr. Synthia Innocent message.  Verbalizes understanding and scheduled OV for f/u on 10/25 at 4:00.

## 2021-01-12 ENCOUNTER — Encounter: Payer: Self-pay | Admitting: Physician Assistant

## 2021-01-12 ENCOUNTER — Ambulatory Visit (INDEPENDENT_AMBULATORY_CARE_PROVIDER_SITE_OTHER): Payer: Medicare Other | Admitting: Physician Assistant

## 2021-01-12 ENCOUNTER — Other Ambulatory Visit: Payer: Self-pay

## 2021-01-12 VITALS — BP 130/70 | HR 104 | Ht 62.0 in | Wt 132.0 lb

## 2021-01-12 DIAGNOSIS — I739 Peripheral vascular disease, unspecified: Secondary | ICD-10-CM

## 2021-01-12 DIAGNOSIS — I272 Pulmonary hypertension, unspecified: Secondary | ICD-10-CM

## 2021-01-12 DIAGNOSIS — I10 Essential (primary) hypertension: Secondary | ICD-10-CM

## 2021-01-12 DIAGNOSIS — I4892 Unspecified atrial flutter: Secondary | ICD-10-CM | POA: Diagnosis not present

## 2021-01-12 DIAGNOSIS — E785 Hyperlipidemia, unspecified: Secondary | ICD-10-CM | POA: Diagnosis not present

## 2021-01-12 DIAGNOSIS — I4819 Other persistent atrial fibrillation: Secondary | ICD-10-CM | POA: Diagnosis not present

## 2021-01-12 MED ORDER — METOPROLOL SUCCINATE ER 25 MG PO TB24: ORAL_TABLET | ORAL | 1 refills | Status: DC

## 2021-01-12 MED ORDER — METOPROLOL SUCCINATE ER 50 MG PO TB24: ORAL_TABLET | ORAL | 1 refills | Status: DC

## 2021-01-12 NOTE — Patient Instructions (Addendum)
Medication Instructions:  - Your physician has recommended you make the following change in your medication:   1) INCREASE metoprolol succinate 50 mg: - take 1 tablet by mouth TWICE daily   2) STOP amlodipine  3) STOP amiodarone  *If you need a refill on your cardiac medications before your next appointment, please call your pharmacy*   Lab Work: - none ordered  If you have labs (blood work) drawn today and your tests are completely normal, you will receive your results only by: [place] (if you have MyChart) OR A paper copy in the mail If you have any lab test that is abnormal or we need to change your treatment, we will call you to review the results.   Testing/Procedures: - none ordered   Follow-Up: At [facility], you and your health needs are our priority.  As part of our continuing mission to provide you with exceptional heart care, we have created designated Provider Care Teams.  These Care Teams include your primary Cardiologist (physician) and Advanced Practice Providers (APPs -  Physician Assistants and Nurse Practitioners) who all work together to provide you with the care you need, when you need it.  We recommend signing up for the patient portal called "MyChart".  Sign up information is provided on this After Visit Summary.  MyChart is used to connect with patients for Virtual Visits (Telemedicine).  Patients are able to view lab/test results, encounter notes, upcoming appointments, etc.  Non-urgent messages can be sent to your provider as well.   To learn more about what you can do with MyChart, go to [email or web address].    Your next appointment:   2-3 month(s)  The format for your next appointment:   In Person  Provider:   [name], MD (only)  Other Instructions N/a

## 2021-01-12 NOTE — Progress Notes (Signed)
Office Visit    Patient Name: [name] Date of Encounter: 01/12/2021  PCP:  Ria Bush, [place]  Cardiologist:  None  Advanced Practice Provider:  Arvil Chaco, PA-C Electrophysiologist:  None [phone]   Chief Complaint    Chief Complaint  Patient presents with   Follow-up    Post cardioversion follow up and medications verbally reviewed with patient and daughter.     87 y.o. female with history of atrial flutter/ fibrillation on reduced dose Eliquis 2.5 mg twice daily (see below regarding reduced dosing), HFpEF, pulmonary hypertension, MR/TR/AR, essential hypertension, hyperlipidemia, PAD with recent angiography of tibial disease and underwent angioplasty on bilateral tibial vessels, chronic fatigue, recent 3/30 CT showing vertebral body compression fractures at T5 and L1, and here for follow-up after recent DCCV.  Past Medical History    Past Medical History:  Diagnosis Date   Abdominal aortic atherosclerosis ([place]) 09/2015   by xray   Anxiety    BCC (basal cell carcinoma of skin) 05/2011   on nose   BCC (basal cell carcinoma of skin) 08/09/2020   upper chest, [facility] 12/27/20   Breast lesion    a. diagnosed with complex sclerosing lesion with calcifications of the right breast in 08/2016   Chest pain    a. nuclear stress test 03/2012: normal; b. 09/2018 MV: EF >65%, no isch/scar.    DDD (degenerative disc disease), lumbosacral 12/06/1992   Gallstones 09/2015   incidentally by xray   GERD (gastroesophageal reflux disease) 05/1999   HERPES ZOSTER 04/13/2007   Qualifier: Diagnosis of  By: [name], [name]    Hiatal hernia    History of shingles    ophthalmic, takes acyclovir daily   Hyperlipidemia 12/2003   Hypertension 1990   Mitral regurgitation    a. echo 03/2012: EF 60-65%, no RWMA, mild to mod AI/MR, mod TR, PASP 37 mmHg   Osteoarthritis 08/21/1989   Osteoporosis with fracture 11/1997   with  compression fracture T12   Persistent atrial fibrillation ([place]) 03/16/2012   a. CHADS2VASc => 5 (HTN, age x 2, vascular disease, sex category)-->Eliquis 5 BID;  b. Recurrent AF in 06/2016;  c. 01/2017 s/p DCCV;  d. 02/2017 recurrent AFib->Amio added->DCCV; e. 03/2017 recurrent AFib, s/p reload of amio and DCCV 04/02/2017   POSTHERPETIC NEURALGIA 04/20/2007   Qualifier: Diagnosis of  By: [name], Billie-Lynn Daniels    Rheumatoid arthritis ([place])    RLS (restless legs syndrome)    Past Surgical History:  Procedure Laterality Date   ABDOMINAL HYSTERECTOMY  Age 23 - 68   S/P TAH   abdominal ultrasound  04/23/2004   Gallstones   BREAST EXCISIONAL BIOPSY Right 2018   neg/benign complex sclerosing lesion   BREAST LUMPECTOMY WITH RADIOACTIVE SEED LOCALIZATION Right 10/2016   breast lumpectomy with radioactive seed localization and margin assessment for complex sclerosing lesion ([place])   BREAST LUMPECTOMY WITH RADIOACTIVE SEED LOCALIZATION Right 11/05/2016   Procedure: RIGHT BREAST LUMPECTOMY WITH RADIOACTIVE SEED LOCALIZATION;  Surgeon: Fanny Skates, MD;  Location: ;  Service: General;  Laterality: Right;   CARDIAC CATHETERIZATION     CARDIOVERSION N/A 01/31/2017   Procedure: CARDIOVERSION;  Surgeon: Wellington Hampshire, MD;  Location: [facility];  Service: Cardiovascular;  Laterality: N/A;   CARDIOVERSION N/A 03/03/2017   Procedure: CARDIOVERSION;  Surgeon: Wellington Hampshire, MD;  Location: [facility];  Service: Cardiovascular;  Laterality: N/A;   CARDIOVERSION N/A 04/02/2017  Procedure: CARDIOVERSION;  Surgeon: Wellington Hampshire, MD;  Location: [facility];  Service: Cardiovascular;  Laterality: N/A;   CARDIOVERSION N/A 01/16/2018   Procedure: CARDIOVERSION;  Surgeon: Wellington Hampshire, MD;  Location: [place] [facility];  Service: Cardiovascular;  Laterality: N/A;   CARDIOVERSION N/A 12/25/2020   Procedure: CARDIOVERSION;  Surgeon: Wellington Hampshire, MD;  Location: [facility];   Service: Cardiovascular;  Laterality: N/A;   CATARACT EXTRACTION  05/2010   left eye   CERVICAL DISCECTOMY  1992   Fusion (Dr. Joya Salm)   ERCP N/A 10/30/2020   Procedure: ENDOSCOPIC RETROGRADE CHOLANGIOPANCREATOGRAPHY (ERCP);  Surgeon: Jackquline Denmark, MD;  Location: [facility] ENDOSCOPY;  Service: Endoscopy;  Laterality: N/A;   ESI Bilateral 01/2016   S1 transforaminal ESI x3   ESOPHAGOGASTRODUODENOSCOPY  01/01/2002   with ulcer, bx. neg; + stricture gastric ulcer with hemorrhage   ESOPHAGOGASTRODUODENOSCOPY  04/23/2004   Esophageal stricture -- dilated   INTRAVASCULAR PRESSURE WIRE/FFR STUDY N/A 07/12/2019   Procedure: INTRAVASCULAR PRESSURE WIRE/FFR STUDY;  Surgeon: Wellington Hampshire, MD;  Location: [place] [facility];  Service: Cardiovascular;  Laterality: N/A;   LOWER EXTREMITY ANGIOGRAPHY Left 02/01/2019   LOWER EXTREMITY ANGIOGRAPHY;  Surgeon: Algernon Huxley, MD   LOWER EXTREMITY ANGIOGRAPHY Right 03/25/2019   Procedure: LOWER EXTREMITY ANGIOGRAPHY;  Surgeon: Algernon Huxley, MD;  Location: [place] [facility];  Service: Cardiovascular;  Laterality: Right;   LOWER EXTREMITY ANGIOGRAPHY Right 08/02/2019   Procedure: LOWER EXTREMITY ANGIOGRAPHY;  Surgeon: Algernon Huxley, MD;  Location: [place] [facility];  Service: Cardiovascular;  Laterality: Right;   nuclear stress test  03/2012   no ischemia   PERIPHERAL VASCULAR BALLOON ANGIOPLASTY Left 01/2019   Percutaneous transluminal angioplasty of left anterior and posterior tibial artery with 2.5 mm diameter by 22 cm length angioplasty balloon ([place])   REMOVAL OF STONES  10/30/2020   Procedure: REMOVAL OF STONES;  Surgeon: Jackquline Denmark, MD;  Location: [facility] ENDOSCOPY;  Service: Endoscopy;;   RIGHT/LEFT HEART CATH AND CORONARY ANGIOGRAPHY Bilateral 07/12/2019   Procedure: RIGHT/LEFT HEART CATH AND CORONARY ANGIOGRAPHY;  Surgeon: Wellington Hampshire, MD;  Location: [place] [facility];  Service: Cardiovascular;  Laterality: Bilateral;   SKIN CANCER EXCISION   05/20/2011   BCC from nose   SPHINCTEROTOMY  10/30/2020   Procedure: SPHINCTEROTOMY;  Surgeon: Jackquline Denmark, MD;  Location: [facility] ENDOSCOPY;  Service: Endoscopy;;   US ECHOCARDIOGRAPHY  03/2012   in flutter, EF 60%, mild-mod aortic, mitral, tricuspid regurg, mildly dilated LA    Allergies  Allergies  Allergen Reactions   Penicillins Rash and Other (See Comments)    Childhood Allergy Has patient had a PCN reaction causing immediate rash, facial/tongue/throat swelling, SOB or lightheadedness with hypotension: Yes Has patient had a PCN reaction causing severe rash involving mucus membranes or skin necrosis: Unknown Has patient had a PCN reaction that required hospitalization: No Has patient had a PCN reaction occurring within the last 10 years: No If all of the above answers are "NO", then may proceed with Cephalosporin use.     History of Present Illness    [name] is a 87 y.o. female with PMH as above. [name] is a 87 y.o. female with PMH as above. She has a history of hearing loss.  She has a known history of persistent atrial fibrillation and flutter maintaining sinus rhythm with amiodarone, chronic dyspnea and fatigue, essential hypertension, hyperlipidemia, PAD, and rheumatoid arthritis.   She underwent angioplasty on bilateral tibial vessels with improvement  in symptoms.     05/2019 echo showed nl LVSF, G2DD, severe pulmonary HTN, moderate MR, moderate TR.   Seen in clinic with ongoing and progressive DOE/fatigue, as well as concern for worsening PAD.  She became significantly short of breath when she attempted to stand during her visit . [facility] appointment was recommended, given following her recent revascularization by [facility], she initially had pink feet that were warm at night but had since turned cyanotic and cold (with continued numbness and weakness).    [facility] 07/12/2019 showed mild to moderate nonobstructive CAD.  The worst stenosis was in the ostium of the RCA, which was  calcified and eccentric.  Right heart cardiac catheterization showed mildly elevated left sided filling pressure with PCWP 15 mmHg and mild pulmonary hypertension at 41/13 mmHg with low normal cardiac output at 4.02 and cardiac index 2.48.  Pulmonary vascular resistance was 2.74 Woods unit.  She was continued on her same dose of furosemide.   In July, she suffered a fracture to her R wrist after a fall. She had AOC renal failure, improving after discontinuing lasix. She had LE arterial angiogram by Dr. Lucky Cowboy with endovascular intervention and tibial vessels bilaterally.    When seen in clinic thereafter, she noted another fall and back discomfort. She continued to note DOE and some swelling in her leg, improving with elevation. She was continued on medical management without a diuretic, though it was noted she could consider lasix 10m once daily if needed. It was noted she could consider Coreg if BP continued to be elevated. She was kept at Eliquis 2.548mBID, given her recurrent falls (as no longer met criteria for reduced dosing). It was noted that there was no clear cardiac explanation for her DOE. PFTs could be considered, though given her chronic pain, it was doubtful she could give a good effort.    When seen in clinic 8/25 by her primary cardiologist, she was noted to be in Afib after maintaining NSR on small dose amiodarone.  She was continued on reduced dose of Eliquis, given her age and weight.  It was noted that atrial fibrillation in the past was associated with significant heart failure, thus return of NSR was stressed.  She is increased amiodarone 200 mg twice daily and Toprol 25 mg twice daily.  BMET and TSH checked and WNL.  Recommendation was to return to clinic in 2 weeks, and if still in atrial fibrillation at that time, repeat DCCV on amiodarone.  The hope is that she would undergo pharmacologic conversion before that time.  Seen at follow-up and still in atrial flutter with extreme  fatigue. She also had shortness of breath, dyspnea, and ankle/pedal edema.  DCCV scheduled and completed with return of NSR.  Today, 01/15/21, she RTC and is back in atrial flutter with RVR.  She reports taking metoprolol 25 mg twice per day.  She has not needed any Lasix between visits with BP, heart rate, and weight log presented.  She notes some swelling as per usual and her left foot and due to her vascular disease and not increased from baseline.  She notes elevated ventricular rates at home shortly after her cardioversion with ventricular rates 105, 101, 94, and 100 bpm.  She is trying to walk a little bit each day without any exertional symptoms.  She denies any chest pain, shortness of breath, palpitations, presyncope, syncope, or recent falls.  No signs or symptoms of bleeding.  She reports medication compliance.  She feels very  well today.  In fact, she was able to get up and do her make-up.  Overall, she feels great since her diuresis and last rx adjustment.  Home Medications   Current Outpatient Medications  Medication Instructions   acetaminophen (TYLENOL) 500 mg, Oral, Every 6 hours PRN   acyclovir (ZOVIRAX) 400 mg, Oral, 2 times daily,     Alpha-Lipoic Acid 600 mg, Oral, Daily   amiodarone (PACERONE) 200 mg, Oral, Daily   amLODipine (NORVASC) 5 mg, Oral, Daily   apixaban (ELIQUIS) 2.5 MG TABS tablet TAKE 1 TABLET BY MOUTH TWICE DAILY   atorvastatin (LIPITOR) 20 MG tablet TAKE 1 TABLET BY MOUTH ONCE DAILY   B-12 Compliance Injection 1,000 mcg, Injection, Every 30 days   Docusate Calcium (STOOL SOFTENER PO) 1 capsule, Oral, Daily   DULoxetine (CYMBALTA) 60 MG capsule TAKE 1 CAPSULE BY MOUTH ONCE DAILY   furosemide (LASIX) 20 mg, Oral, As needed   gabapentin (NEURONTIN) 400 mg, Oral, Daily at bedtime, With second dose during day as needed   lisinopril (ZESTRIL) 20 mg, Oral, Daily   memantine (NAMENDA) 5 MG tablet TAKE 1 TABLET BY MOUTH TWICE DAILY   metoprolol succinate (TOPROL-XL)  25 mg, Oral, 3 times daily   mupirocin ointment (BACTROBAN) 2 % 1 application, Topical, Daily, With dressing changes   pantoprazole (PROTONIX) 40 MG tablet TAKE 1 TABLET BY MOUTH ONCE DAILY   polyethylene glycol (MIRALAX / GLYCOLAX) 17 g, Oral, Daily PRN   prednisoLONE acetate (PRED FORTE) 1 % ophthalmic suspension 1 drop, Left Eye, Daily   rOPINIRole (REQUIP) 1 MG tablet TAKE 2 AND 1/2 TABLETS BY MOUTH AT BEDTIME   silver sulfADIAZINE (SILVADENE) 1 % cream 1 application, Topical, Daily   tiZANidine (ZANAFLEX) 2 mg, Oral, Daily   Vitamin D, Ergocalciferol, (DRISDOL) 1.25 MG (50000 UNIT) CAPS capsule TAKE 1 CAPSULE BY MOUTH ONCE WEEKLY     Review of Systems    She reports resolution of previous SOB, DOE. LLE edema is chronic. She denies chest pain, palpitations, pnd, orthopnea, n, v, dizziness, syncope, or early satiety.   All other systems reviewed and are otherwise negative except as noted above.  Physical Exam    VS:  BP 130/70 (BP Location: Right Arm, Patient Position: Sitting, Cuff Size: Normal)   Pulse (!) 104   Ht '5\' 2"'  (1.575 m)   Wt 132 lb (59.9 kg)   SpO2 97%   BMI 24.14 kg/m  , BMI Body mass index is 24.14 kg/m. GEN: Well nourished, well developed, in no acute distress. HEENT: normal. Neck: Supple, no JVD, carotid bruits, or masses. Cardiac: tachycardic and irregular, no murmurs, rubs, or gallops. No clubbing, cyanosis.  Mild bilateral edema, worse on the left side associated with vascular dz.  Chronic stasis dermatitis.  Radials 2+ and equal bilaterally.  Respiratory:  Respirations regular and unlabored, CTAB GI: Soft, nontender, nondistended, BS + x 4. MS: no deformity or atrophy. Skin: warm and dry, no rash. Neuro:  Strength and sensation are intact. Psych: Normal affect.  Accessory Clinical Findings    ECG personally reviewed by me today -atrial flutter with 2-1 conduction, 104bpm, TWI inferior leads- no acute changes.  VITALS Reviewed today   Temp Readings  from Last 3 Encounters:  12/25/20 98.2 F (36.8 C) (Oral)  11/06/20 98.7 F (37.1 C) (Temporal)  11/01/20 97.8 F (36.6 C) (Oral)   BP Readings from Last 3 Encounters:  12/25/20 (!) 178/80  12/15/20 140/78  11/30/20 (!) 160/110   Pulse Readings from  Last 3 Encounters:  12/25/20 (!) 51  12/15/20 (!) 107  11/30/20 (!) 128    Wt Readings from Last 3 Encounters:  12/25/20 129 lb 3 oz (58.6 kg)  12/15/20 129 lb 2 oz (58.6 kg)  11/30/20 125 lb 6 oz (56.9 kg)     LABS  reviewed today   Lab Results  Component Value Date   WBC 7.2 12/21/2020   HGB 13.0 12/21/2020   HCT 38.4 12/21/2020   MCV 97 12/21/2020   PLT 237 12/21/2020   Lab Results  Component Value Date   CREATININE 0.97 12/21/2020   BUN 15 12/21/2020   NA 143 12/21/2020   K 4.3 12/21/2020   CL 104 12/21/2020   CO2 22 12/21/2020   Lab Results  Component Value Date   ALT 71 (H) 11/01/2020   AST 23 11/01/2020   ALKPHOS 151 (H) 11/01/2020   BILITOT 0.6 11/01/2020   Lab Results  Component Value Date   CHOL 133 06/15/2020   HDL 71.70 06/15/2020   LDLCALC 47 06/15/2020   LDLDIRECT 173.1 02/19/2010   TRIG 70.0 06/15/2020   CHOLHDL 2 06/15/2020    No results found for: HGBA1C Lab Results  Component Value Date   TSH 4.440 11/30/2020     STUDIES/PROCEDURES reviewed today   [facility] 07/12/19 Ost LAD lesion is 40% stenosed. Mid LAD lesion is 30% stenosed. The left ventricular systolic function is normal. LV end diastolic pressure is mildly elevated. The left ventricular ejection fraction is 55-65% by visual estimate. Ost RCA to Prox RCA lesion is 50% stenosed 1.  Mild to moderate nonobstructive coronary artery disease.  The worst stenosis was 50% in the ostium of the right coronary artery which was calcified and eccentric.  Fractional flow reserve was done on this lesion and was not significant with an iFR ratio of 0.98. 2.  Normal LV systolic function. 3.  Right heart catheterization showed mildly elevated  left-sided filling pressure with pulmonary capillary wedge pressure 15 mmHg, mild pulmonary hypertension at 41 over 13 mmHg and low normal cardiac output at 4.02 with a cardiac index of 2.48.  Pulmonary vascular resistance is 2.74 Woods unit Recommendations: Recommend medical therapy for nonobstructive coronary artery disease. Volume status appears reasonable and recommend continuing same dose of furosemide. The patient seems to be reasonably optimized from a cardiac standpoint. Avoid catheterization via the right radial artery in the future if needed given small vessel diameter which is diffusely diseased.   3/30 CT IMPRESSION: VASCULAR 1. Mild atherosclerotic disease throughout the aorta without evidence for an aortic aneurysm or dissection. Aortic Atherosclerosis (ICD10-I70.0). 2. No evidence for inflow or outflow disease. 3. Concern for bilateral runoff disease but limited evaluation of the runoff vessels bilaterally. Minimal flow in the distal runoff vessels but this could be related to technical factors and timing of the study. 4. At least mild stenosis involving the main renal arteries. 5. No evidence for a large or central pulmonary embolism. NON-VASCULAR 1. Vertebral body compression fractures at T5 and L1. Age of these fractures are unknown but the fractures could be associated with the patient's pain. 2. Cholelithiasis with distended gallbladder. No evidence for gallbladder inflammation. 3. Large amount of stool in the colon. 4. Moderate sized hiatal hernia. 5. No acute chest abnormality.   07/06/19 Bilateral ABIs appear essentially unchanged compared to prior study on  04/23/2019. Bilateral TBIs appear essentially unchanged compared to prior  study on 1/15/20212.  Summary:  Right: Resting right ankle-brachial index is within normal  range. No  evidence of significant right lower extremity arterial disease. The right  toe-brachial index is abnormal.  Left: Resting left  ankle-brachial index is within normal range. No  evidence of significant left lower extremity arterial disease. The left  toe-brachial index is abnormal.   05/28/19 Echo  1. Left ventricular ejection fraction, by estimation, is 60 to 65%. The  left ventricle has normal function. The left ventricle has no regional  wall motion abnormalities. There is mild left ventricular hypertrophy.  Left ventricular diastolic parameters  are consistent with Grade II diastolic dysfunction (pseudonormalization).   2. Right ventricular systolic function is normal. The right ventricular  size is mildly enlarged. mildly increased right ventricular wall  thickness. There is severely elevated pulmonary artery systolic pressure.   3. Left atrial size was mild to moderately dilated.   4. Right atrial size was mildly dilated.   5. Moderate mitral valve regurgitation.   6. Tricuspid valve regurgitation is moderate.   7. Aortic valve regurgitation is mild to moderate.   8. The inferior vena cava is normal in size with <50% respiratory  variability, suggesting right atrial pressure of 8 mmHg.    NM 09/2018 Normal pharmacologic myocardial perfusion stress test without significant ischemia or scar. The left ventricular ejection fraction is normal (>65%). This is a low risk study. Attenuation correction CT is notable for coronary artery and aortic atherosclerotic calcification, as well as aortic valve and mitral annular calcification. A moderate-sided hiatal hernia is present. Hyperdensity of the liver is noted, which can be seen with long-term amiodarone use. Clinical/laboratory correlation recommended. Splenic calcifications suggest prior granulomatous disease.  Assessment & Plan    Persistent atrial flutter / atrial fibrillation --S/p DCCV with return of atrial flutter. She is asymptomatic in atrial flutter and denies any tachypalpitations.  She is taking amiodarone 200 mg BID and Toprol 25 mg twice daily. Plan  for rate control only.  Ventricular rate remains elevated but under 110 bpm at rest.  Discontinue amlodipine and amiodarone. Increase to Toprol 64m BID. Continue low-dose Eliquis 2.5 mg daily, given age and weight.     Chronic diastolic heart failure, Mild pulmonary hypertension Valvular Dz: Moderate MR/TR and mild to moderate AR DOE, SOB --Improved sx s/p recent diuresis with PRN diuresis thereafter. Continue PRN diuresis as needed for wt gain.   Essential hypertension --Continue current BB, lisinopril   HLD --Continue current statin.     Peripheral arterial disease  --S/p intervention with further recommendations per vascular surgery.  Continue ASA and statin.  ==============  Medication changes:  --Discontinue amiodarone --Discontinue amlodipine --Increase to Toprol 537mBID  Labs ordered: BMET Studies / Imaging ordered: None Disposition: RTC 2-3 mo  *Please be aware that the above documentation was completed voice recognition software and may contain dictation errors.    JaArvil ChacoPA-C 01/12/2021

## 2021-01-16 ENCOUNTER — Telehealth: Payer: Self-pay | Admitting: Family Medicine

## 2021-01-16 NOTE — Progress Notes (Signed)
  Chronic Care Management   Note  01/16/2021 Name: [name] MRN: [id number] DOB: [birth year]  [name] is a 87 y.o. year old female who is a primary care patient of Ria Bush, MD. I reached out to [name] by phone today in response to a referral sent by Ms. Joline Salt [id number] PCP, Ria Bush, MD.   [name] was given information about Chronic Care Management services today including:  CCM service includes personalized support from designated clinical staff supervised by her physician, including individualized plan of care and coordination with other care providers 24/7 contact phone numbers for assistance for urgent and routine care needs. Service will only be billed when office clinical staff spend 20 minutes or more in a month to coordinate care. Only one practitioner may furnish and bill the service in a calendar month. The patient may stop CCM services at any time (effective at the end of the month) by phone call to the office staff.   PATRICIA COLEMAN/DAUGHTER verbally agreed to assistance and services provided by embedded care coordination/care management team today.  Follow up plan:   Tatjana Secretary/administrator

## 2021-01-16 NOTE — Chronic Care Management (AMB) (Signed)
  Chronic Care Management   Outreach Note  01/16/2021 Name: [name] MRN: [id number] DOB: [birth year]  Referred by: Ria Bush, MD Reason for referral : No chief complaint on file.   An unsuccessful telephone outreach was attempted today. The patient was referred to the pharmacist for assistance with care management and care coordination.   Follow Up Plan:   Tatjana Dellinger Upstream Scheduler

## 2021-01-23 ENCOUNTER — Ambulatory Visit (INDEPENDENT_AMBULATORY_CARE_PROVIDER_SITE_OTHER): Payer: Medicare Other

## 2021-01-23 ENCOUNTER — Other Ambulatory Visit: Payer: Self-pay

## 2021-01-23 ENCOUNTER — Ambulatory Visit (INDEPENDENT_AMBULATORY_CARE_PROVIDER_SITE_OTHER): Payer: Medicare Other | Admitting: Podiatry

## 2021-01-23 ENCOUNTER — Telehealth: Payer: Self-pay | Admitting: Family Medicine

## 2021-01-23 DIAGNOSIS — M79675 Pain in left toe(s): Secondary | ICD-10-CM | POA: Diagnosis not present

## 2021-01-23 DIAGNOSIS — M810 Age-related osteoporosis without current pathological fracture: Secondary | ICD-10-CM | POA: Diagnosis not present

## 2021-01-23 DIAGNOSIS — M79674 Pain in right toe(s): Secondary | ICD-10-CM | POA: Diagnosis not present

## 2021-01-23 DIAGNOSIS — Z23 Encounter for immunization: Secondary | ICD-10-CM | POA: Diagnosis not present

## 2021-01-23 DIAGNOSIS — B351 Tinea unguium: Secondary | ICD-10-CM

## 2021-01-23 DIAGNOSIS — I739 Peripheral vascular disease, unspecified: Secondary | ICD-10-CM

## 2021-01-23 MED ORDER — DENOSUMAB 60 MG/ML ~~LOC~~ SOSY
60.0000 mg | PREFILLED_SYRINGE | Freq: Once | SUBCUTANEOUS | Status: AC
  Administered 2021-01-23: 60 mg via SUBCUTANEOUS

## 2021-01-23 NOTE — Telephone Encounter (Signed)
Pt daughter called and wanted to know if gabapentin 400mg  can be increased or if another medication can be called in for leg pain at night

## 2021-01-23 NOTE — Telephone Encounter (Signed)
Watch gabapentin effect as prior higher dose caused worsening leg swelling.

## 2021-01-23 NOTE — Telephone Encounter (Signed)
Spoke with pt's daughter, [name] (on dpr), asking how pt is taking gabapentin.  States pt takes 1 tab at night. I relayed Dr. Synthia Innocent message.  [name] states they will try adding the 1 cap in the afternoon to see if that helps.  She will let Dr. Darnell Level know.

## 2021-01-23 NOTE — Telephone Encounter (Addendum)
Is she currently taking gabapentin once or twice at night? She could try gabapentin 400mg  in afternoon and another dose at night time.  Alternatively she could take 600mg  at night time - we would need to change capsule dose though.

## 2021-01-23 NOTE — Progress Notes (Signed)
Per orders of Dr. Danise Mina, injection of Prolia and High Dose Flu were given by Ophelia Shoulder. Patient tolerated both injections well.

## 2021-01-24 ENCOUNTER — Ambulatory Visit: Payer: Medicare Other

## 2021-01-24 NOTE — Telephone Encounter (Signed)
Spoke with [name] relaying Dr. Synthia Innocent message.  Verbalizes understanding and expresses her thanks.

## 2021-01-25 NOTE — Progress Notes (Signed)
  Subjective:  Patient ID: [name], female    DOB: [birth year],  MRN: [id number]  Chief Complaint  Patient presents with   Nail Problem    Nail trim    87 y.o. female returns for the above complaint.  Patient presents with thickened elongated dystrophic toenails x10.  Patient states that they are painful to touch.  She is not able to take care of her self.  Patient has a history of peripheral vascular disease without good circulation to both lower extremity.   Objective:  There were no vitals filed for this visit. Podiatric Exam: Vascular: dorsalis pedis and posterior tibial pulses are non palpable bilateral. Capillary return is sluggish temperature gradient is WNL. Skin turgor WNL.  Dependent rubor noted to bilateral lower extremity Sensorium: Normal Semmes Weinstein monofilament test. Normal tactile sensation bilaterally. Nail Exam: Pt has thick disfigured discolored nails with subungual debris noted bilateral entire nail hallux through fifth toenails.  Pain on palpation to the nails. Ulcer Exam: There is no evidence of ulcer or pre-ulcerative changes or infection. Orthopedic Exam: Muscle tone and strength are WNL. No limitations in general ROM. No crepitus or effusions noted. HAV  B/L.  Hammer toes 2-5  B/L. Skin: No Porokeratosis. No infection or ulcers    Assessment & Plan:   1. Peripheral vascular disease ([place])   2. Pain due to onychomycosis of toenails of both feet      Patient was evaluated and treated and all questions answered.  Right lateral submetatarsal 5 ulceration -This wound is primarily managed by the wound care center.  I will defer further recommendation to them.  Onychomycosis with pain  -Nails palliatively debrided as below. -Educated on self-care  Procedure: Nail Debridement Rationale: pain  Type of Debridement: manual, sharp debridement. Instrumentation: Nail nipper, rotary burr. Number of Nails: 10  Procedures and Treatment: Consent by patient  was obtained for treatment procedures. The patient understood the discussion of treatment and procedures well. All questions were answered thoroughly reviewed. Debridement of mycotic and hypertrophic toenails, 1 through 5 bilateral and clearing of subungual debris. No ulceration, no infection noted.  Return Visit-Office Procedure: Patient instructed to return to the office for a follow up visit 3 months for continued evaluation and treatment.  Boneta Lucks, DPM    No follow-ups on file.

## 2021-01-30 ENCOUNTER — Ambulatory Visit (INDEPENDENT_AMBULATORY_CARE_PROVIDER_SITE_OTHER): Payer: Medicare Other | Admitting: Family Medicine

## 2021-01-30 ENCOUNTER — Encounter: Payer: Self-pay | Admitting: Family Medicine

## 2021-01-30 ENCOUNTER — Other Ambulatory Visit: Payer: Self-pay

## 2021-01-30 VITALS — BP 148/100 | HR 107 | Temp 97.7°F | Ht 62.0 in | Wt 133.0 lb

## 2021-01-30 DIAGNOSIS — I10 Essential (primary) hypertension: Secondary | ICD-10-CM

## 2021-01-30 DIAGNOSIS — G2581 Restless legs syndrome: Secondary | ICD-10-CM | POA: Diagnosis not present

## 2021-01-30 DIAGNOSIS — Z8739 Personal history of other diseases of the musculoskeletal system and connective tissue: Secondary | ICD-10-CM

## 2021-01-30 DIAGNOSIS — I4819 Other persistent atrial fibrillation: Secondary | ICD-10-CM

## 2021-01-30 DIAGNOSIS — N1831 Chronic kidney disease, stage 3a: Secondary | ICD-10-CM

## 2021-01-30 DIAGNOSIS — G6289 Other specified polyneuropathies: Secondary | ICD-10-CM | POA: Diagnosis not present

## 2021-01-30 DIAGNOSIS — F331 Major depressive disorder, recurrent, moderate: Secondary | ICD-10-CM | POA: Diagnosis not present

## 2021-01-30 DIAGNOSIS — M86671 Other chronic osteomyelitis, right ankle and foot: Secondary | ICD-10-CM

## 2021-01-30 DIAGNOSIS — I739 Peripheral vascular disease, unspecified: Secondary | ICD-10-CM

## 2021-01-30 NOTE — Progress Notes (Signed)
Patient ID: [name], female    DOB: 1929-09-02, 87 y.o.   MRN: [id number]  This visit was conducted in person.  BP (!) 148/100 (BP Location: Left Arm, Cuff Size: Normal)   Pulse (!) 107   Temp 97.7 F (36.5 C) (Temporal)   Ht '5\' 2"'  (1.575 m)   Wt 133 lb (60.3 kg)   SpO2 94%   BMI 24.33 kg/m    CC: f/u visit  Subjective:   HPI: [name] is a 87 y.o. female presenting on 01/30/2021 for Follow-up   BP staying elevated - brings log showing BP nadir 98/67 HR 91, peak 160s/100s HR 90s. Last few days 140-150/90-100s.   On gabapentin 46m early afternoon and bedtime, for worsening evening leg pain.   Persistent afib/aflutter - s/p unsuccessful DCCV 12/2020. Recently amiodarone was stopped, toprol XL was increased to 539mBID. She continues low dose eliquis 2.69m3mid.   Continues PRN lasix for diuresis - has not recently needed. Weight gain noted.   Known PAD s/p tibial vessel intervention managed by Dr DewLucky Cowboyhis did help leg pains.    [facility]   Recent prolia injection 01/23/2021.   Wound - released from wound clinic last month. No further open wounds.   Osteomyelitis - treated with 6 wk doxycycline course summer 2022.      Relevant past medical, surgical, family and social history reviewed and updated as indicated. Interim medical history since our last visit reviewed. Allergies and medications reviewed and updated. Outpatient Medications Prior to Visit  Medication Sig Dispense Refill   acetaminophen (TYLENOL) 500 MG tablet Take 500 mg by mouth every 6 (six) hours as needed for mild pain.     acyclovir (ZOVIRAX) 400 MG tablet Take 400 mg by mouth 2 (two) times daily.     Alpha-Lipoic Acid 600 MG TABS Take 600 mg by mouth daily.     apixaban (ELIQUIS) 2.5 MG TABS tablet TAKE 1 TABLET BY MOUTH TWICE DAILY 180 tablet 1   atorvastatin (LIPITOR) 20 MG tablet TAKE 1 TABLET BY MOUTH ONCE DAILY 90 tablet 3   Cyanocobalamin  (B-12 COMPLIANCE INJECTION) 1000 MCG/ML KIT Inject 1,000 mcg as directed every 30 (thirty) days.     Docusate Calcium (STOOL SOFTENER PO) Take 1 capsule by mouth daily.     DULoxetine (CYMBALTA) 60 MG capsule TAKE 1 CAPSULE BY MOUTH ONCE DAILY 90 capsule 0   furosemide (LASIX) 20 MG tablet Take 1 tablet (20 mg total) by mouth as needed. 90 tablet 3   gabapentin (NEURONTIN) 400 MG capsule Take 1 capsule (400 mg total) by mouth at bedtime. With second dose during day as needed 150 capsule 1   lisinopril (ZESTRIL) 20 MG tablet Take 1 tablet (20 mg total) by mouth daily. 30 tablet 1   memantine (NAMENDA) 5 MG tablet TAKE 1 TABLET BY MOUTH TWICE DAILY 60 tablet 6   metoprolol succinate (TOPROL-XL) 50 MG 24 hr tablet Take 1 tablet (50 mg) by mouth twice daily. Take with or immediately following a meal. 180 tablet 1   mupirocin ointment (BACTROBAN) 2 % Apply 1 application topically daily. With dressing changes 22 g 0   pantoprazole (PROTONIX) 40 MG tablet TAKE 1 TABLET BY MOUTH ONCE DAILY 90 tablet 0   polyethylene glycol (MIRALAX / GLYCOLAX) 17 g packet Take 17 g by mouth daily as needed for mild constipation. 14 each 0   prednisoLONE acetate (PRED FORTE) 1 %  ophthalmic suspension Place 1 drop into the left eye daily.      tiZANidine (ZANAFLEX) 2 MG tablet Take 2 mg by mouth daily.     traMADol (ULTRAM) 50 MG tablet Take 50-100 mg by mouth at bedtime as needed.     Vitamin D, Ergocalciferol, (DRISDOL) 1.25 MG (50000 UNIT) CAPS capsule TAKE 1 CAPSULE BY MOUTH ONCE WEEKLY 12 capsule 3   rOPINIRole (REQUIP) 1 MG tablet TAKE 2 AND 1/2 TABLETS BY MOUTH AT BEDTIME 225 tablet 1   rOPINIRole (REQUIP) 1 MG tablet Take 2.5 tablets (2.5 mg total) by mouth at bedtime.     No facility-administered medications prior to visit.     Per HPI unless specifically indicated in ROS section below Review of Systems  Objective:  BP (!) 148/100 (BP Location: Left Arm, Cuff Size: Normal)   Pulse (!) 107   Temp 97.7 F  (36.5 C) (Temporal)   Ht '5\' 2"'  (1.575 m)   Wt 133 lb (60.3 kg)   SpO2 94%   BMI 24.33 kg/m   Wt Readings from Last 3 Encounters:  01/30/21 133 lb (60.3 kg)  01/12/21 132 lb (59.9 kg)  12/25/20 129 lb 3 oz (58.6 kg)      Physical Exam Vitals and nursing note reviewed.  Constitutional:      Appearance: Normal appearance. She is not ill-appearing.  Cardiovascular:     Rate and Rhythm: Normal rate and regular rhythm.     Pulses: Normal pulses.     Heart sounds: Normal heart sounds. No murmur heard. Pulmonary:     Effort: Pulmonary effort is normal. No respiratory distress.     Breath sounds: Normal breath sounds. No wheezing, rhonchi or rales.  Neurological:     Mental Status: She is alert.  Psychiatric:        Attention and Perception: Attention normal.        Mood and Affect: Mood is depressed.        Behavior: Behavior normal.        Thought Content: Thought content normal.        Cognition and Memory: Cognition and memory normal.      Results for orders placed or performed in visit on 12/21/20  CBC  Result Value Ref Range   WBC 7.2 3.4 - 10.8 x10E3/uL   RBC 3.96 3.77 - 5.28 x10E6/uL   Hemoglobin 13.0 11.1 - 15.9 g/dL   Hematocrit 38.4 34.0 - 46.6 %   MCV 97 79 - 97 fL   MCH 32.8 26.6 - 33.0 pg   MCHC 33.9 31.5 - 35.7 g/dL   RDW 12.4 11.7 - 15.4 %   Platelets 237 150 - 450 [id number]/[id number]  Basic metabolic panel  Result Value Ref Range   Glucose 94 65 - 99 mg/dL   BUN 15 8 - 27 mg/dL   Creatinine, Ser 0.97 0.57 - 1.00 mg/dL   eGFR 56 (L) >59 mL/min/1.73   BUN/Creatinine Ratio 15 12 - 28   Sodium 143 134 - 144 mmol/L   Potassium 4.3 3.5 - 5.2 mmol/L   Chloride 104 96 - 106 mmol/L   CO2 22 20 - 29 mmol/L   Calcium 9.1 8.7 - 10.3 mg/dL   *Note: Due to a large number of results and/or encounters for the requested time period, some results have not been displayed. A complete set of results can be found in Results Review.    Assessment & Plan:  This visit occurred  during the SARS-CoV-2 public  health emergency.  Safety protocols were in place, including screening questions prior to the visit, additional usage of staff PPE, and extensive cleaning of exam room while observing appropriate contact time as indicated for disinfecting solutions.   Problem List Items Addressed This Visit     MDD (major depressive disorder), recurrent episode ([place])    Chronic, deteriorated despite daily cymbalta. Struggles with physical limitations. Discussed adjuvant remeron however no significant trouble with sleep or appetite. They decline for now. Encouraged ongoing social engagement.       Essential hypertension - Primary    Chronic, deteriorated despite regularly taking lisinopril 24m daily and toprol XL 539mbid. Rare lasix use. Consider increasing BB dose.       RLS (restless legs syndrome)    Notes worsening symptoms.  Discussed option to increase requip to 41m57mightly PRN.       Persistent atrial fibrillation (HCC)    Continue toprol XL, aspirin, low dose eliquis      Peripheral neuropathy    Now on gabapentin 400m57mD. Seems to be tolerating well.       Relevant Medications   rOPINIRole (REQUIP) 1 MG tablet   PAD (peripheral artery disease) (HCC)[place] S/p tibial intervention. Followed by [facility].       CKD (chronic kidney disease) stage 3, GFR 30-59 ml/min (HCC)    Continue to monitor renal function. Update levels today after recent prolia injection.       Relevant Orders   Renal function panel   History of osteomyelitis    Update inflammatory markers.       Relevant Orders   CBC with Differential/Platelet   Sedimentation rate   C-reactive protein     No orders of the defined types were placed in this encounter.  Orders Placed This Encounter  Procedures   Renal function panel   CBC with Differential/Platelet   Sedimentation rate   C-reactive protein     Patient Instructions  Labs today  Ok to continue gabapentin 400 twice dialy for  now If restless leg symptoms worsen, we could try higher requip dose up to 41mg 17mhtly.  Good to see you today.  Return as needed.   Follow up plan: Return in about 4 months (around 06/02/2021) for follow up visit.  JavieRia Payne

## 2021-01-30 NOTE — Patient Instructions (Addendum)
Labs today  Ok to continue gabapentin 400 twice dialy for now If restless leg symptoms worsen, we could try higher requip dose up to 3mg  nightly.  Good to see you today.  Return as needed.

## 2021-01-31 LAB — CBC WITH DIFFERENTIAL/PLATELET
Basophils Absolute: 0.1 10*3/uL (ref 0.0–0.1)
Basophils Relative: 1.4 % (ref 0.0–3.0)
Eosinophils Absolute: 0 10*3/uL (ref 0.0–0.7)
Eosinophils Relative: 0.4 % (ref 0.0–5.0)
HCT: 40.9 % (ref 36.0–46.0)
Hemoglobin: 13.5 g/dL (ref 12.0–15.0)
Lymphocytes Relative: 21 % (ref 12.0–46.0)
Lymphs Abs: 1.4 10*3/uL (ref 0.7–4.0)
MCHC: 33 g/dL (ref 30.0–36.0)
MCV: 98.9 fl (ref 78.0–100.0)
Monocytes Absolute: 0.5 10*3/uL (ref 0.1–1.0)
Monocytes Relative: 7.1 % (ref 3.0–12.0)
Neutro Abs: 4.6 10*3/uL (ref 1.4–7.7)
Neutrophils Relative %: 70.1 % (ref 43.0–77.0)
Platelets: 239 10*3/uL (ref 150.0–400.0)
RBC: 4.13 Mil/uL (ref 3.87–5.11)
RDW: 14.4 % (ref 11.5–15.5)
WBC: 6.5 10*3/uL (ref 4.0–10.5)

## 2021-01-31 LAB — RENAL FUNCTION PANEL
Albumin: 4.1 g/dL (ref 3.5–5.2)
BUN: 16 mg/dL (ref 6–23)
CO2: 28 mEq/L (ref 19–32)
Calcium: 9.2 mg/dL (ref 8.4–10.5)
Chloride: 104 mEq/L (ref 96–112)
Creatinine, Ser: 0.97 mg/dL (ref 0.40–1.20)
GFR: 51.75 mL/min — ABNORMAL LOW (ref >60.00)
Glucose, Bld: 103 mg/dL — ABNORMAL HIGH (ref 70–99)
Phosphorus: 4.5 mg/dL (ref 2.3–4.6)
Potassium: 4.2 mEq/L (ref 3.5–5.1)
Sodium: 140 mEq/L (ref 135–145)

## 2021-01-31 LAB — SEDIMENTATION RATE: Sed Rate: 16 mm/hr (ref 0–30)

## 2021-01-31 LAB — C-REACTIVE PROTEIN: CRP: <1 mg/dL (ref 0.5–20.0)

## 2021-01-31 NOTE — Assessment & Plan Note (Signed)
Chronic, deteriorated despite daily cymbalta. Struggles with physical limitations. Discussed adjuvant remeron however no significant trouble with sleep or appetite. They decline for now. Encouraged ongoing social engagement.

## 2021-01-31 NOTE — Assessment & Plan Note (Signed)
Chronic, deteriorated despite regularly taking lisinopril 20mg  daily and toprol XL 50mg  bid. Rare lasix use. Consider increasing BB dose.

## 2021-01-31 NOTE — Assessment & Plan Note (Signed)
Now on gabapentin 400mg  BID. Seems to be tolerating well.

## 2021-01-31 NOTE — Assessment & Plan Note (Addendum)
Notes worsening symptoms.  Discussed option to increase requip to 3mg  nightly PRN.

## 2021-01-31 NOTE — Assessment & Plan Note (Signed)
Update inflammatory markers.

## 2021-01-31 NOTE — Assessment & Plan Note (Signed)
S/p tibial intervention. Followed by [facility].

## 2021-01-31 NOTE — Assessment & Plan Note (Addendum)
Continue to monitor renal function. Update levels today after recent prolia injection.

## 2021-01-31 NOTE — Assessment & Plan Note (Signed)
Continue toprol XL, aspirin, low dose eliquis

## 2021-02-07 ENCOUNTER — Telehealth: Payer: Self-pay | Admitting: Family Medicine

## 2021-02-07 NOTE — Telephone Encounter (Signed)
Spoke with pt's daughter, [name] (on dpr), asking about pt's foot.  States right foot is swollen, has some redness, warm to the touch and painful.  Says pt was up on her feet yesterday and she usually swells when active.  Says wound has healed.  Plz advise.

## 2021-02-07 NOTE — Telephone Encounter (Addendum)
Recommend she try taking her lasix for next 2-3 days. Recommend leg elevation as able.  If concern for cellulitis, recommend schedule OV for evaluation.   Lab Results  Component Value Date   LABURIC 2.7 01/30/2018  No h/o gout.

## 2021-02-07 NOTE — Telephone Encounter (Signed)
Pt daughter called in stating that pt right foot is swollen and red. Pt can not put weight on it and it looks like its going to pop. Pt would like to talk to someone regarding this either the provider or assistant

## 2021-02-08 NOTE — Telephone Encounter (Signed)
Spoke with [name] relaying Dr. Synthia Innocent message.  Verbalizes understanding.

## 2021-02-13 ENCOUNTER — Other Ambulatory Visit: Payer: Self-pay | Admitting: Family Medicine

## 2021-02-13 DIAGNOSIS — K219 Gastro-esophageal reflux disease without esophagitis: Secondary | ICD-10-CM

## 2021-02-13 DIAGNOSIS — F331 Major depressive disorder, recurrent, moderate: Secondary | ICD-10-CM

## 2021-02-20 ENCOUNTER — Encounter: Payer: Self-pay | Admitting: Family Medicine

## 2021-02-21 ENCOUNTER — Telehealth: Payer: Self-pay | Admitting: Family Medicine

## 2021-02-21 MED ORDER — METOPROLOL SUCCINATE ER 50 MG PO TB24: 75.0000 mg | ORAL_TABLET | Freq: Two times a day (BID) | ORAL | 1 refills | Status: DC

## 2021-02-21 NOTE — Telephone Encounter (Signed)
Pt daughter called stating that pt eye is red and would like some advice on it . Please advise.

## 2021-02-21 NOTE — Telephone Encounter (Signed)
See Pt Msg, 02/20/21.

## 2021-02-21 NOTE — Telephone Encounter (Signed)
Pt's daughter has seen message and responded.

## 2021-02-21 NOTE — Telephone Encounter (Addendum)
Replied via mychart. Plz notify daughter

## 2021-03-14 ENCOUNTER — Telehealth: Payer: Medicare Other

## 2021-03-19 ENCOUNTER — Other Ambulatory Visit: Payer: Self-pay | Admitting: Cardiovascular Disease

## 2021-03-19 ENCOUNTER — Encounter: Payer: Self-pay | Admitting: Family Medicine

## 2021-03-19 NOTE — Telephone Encounter (Signed)
Printed BP log and placed in Dr. Synthia Innocent box.

## 2021-03-22 MED ORDER — AMLODIPINE BESYLATE 5 MG PO TABS: 5.0000 mg | ORAL_TABLET | Freq: Every day | ORAL | 3 refills | Status: DC

## 2021-03-22 NOTE — Addendum Note (Signed)
Addended by: Ria Bush on: 03/22/2021 10:24 AM   Modules accepted: Orders

## 2021-03-23 ENCOUNTER — Encounter (INDEPENDENT_AMBULATORY_CARE_PROVIDER_SITE_OTHER): Payer: Medicare Other

## 2021-03-23 ENCOUNTER — Ambulatory Visit (INDEPENDENT_AMBULATORY_CARE_PROVIDER_SITE_OTHER): Payer: Medicare Other | Admitting: Nurse Practitioner

## 2021-04-04 ENCOUNTER — Ambulatory Visit (INDEPENDENT_AMBULATORY_CARE_PROVIDER_SITE_OTHER): Payer: Medicare Other | Admitting: Nurse Practitioner

## 2021-04-04 ENCOUNTER — Encounter (INDEPENDENT_AMBULATORY_CARE_PROVIDER_SITE_OTHER): Payer: Self-pay | Admitting: Nurse Practitioner

## 2021-04-04 ENCOUNTER — Ambulatory Visit (INDEPENDENT_AMBULATORY_CARE_PROVIDER_SITE_OTHER): Payer: Medicare Other

## 2021-04-04 VITALS — BP 134/73 | HR 59 | Ht 62.0 in | Wt 139.0 lb

## 2021-04-04 DIAGNOSIS — I739 Peripheral vascular disease, unspecified: Secondary | ICD-10-CM | POA: Diagnosis not present

## 2021-04-04 DIAGNOSIS — I10 Essential (primary) hypertension: Secondary | ICD-10-CM

## 2021-04-04 DIAGNOSIS — E782 Mixed hyperlipidemia: Secondary | ICD-10-CM | POA: Diagnosis not present

## 2021-04-12 ENCOUNTER — Encounter: Payer: Self-pay | Admitting: Cardiovascular Disease

## 2021-04-12 ENCOUNTER — Other Ambulatory Visit: Payer: Self-pay

## 2021-04-12 ENCOUNTER — Ambulatory Visit: Payer: Medicare Other | Admitting: Cardiovascular Disease

## 2021-04-12 VITALS — BP 120/64 | HR 97 | Ht 62.0 in | Wt 139.2 lb

## 2021-04-12 DIAGNOSIS — I5032 Chronic diastolic (congestive) heart failure: Secondary | ICD-10-CM | POA: Diagnosis not present

## 2021-04-12 DIAGNOSIS — I10 Essential (primary) hypertension: Secondary | ICD-10-CM

## 2021-04-12 DIAGNOSIS — I739 Peripheral vascular disease, unspecified: Secondary | ICD-10-CM

## 2021-04-12 DIAGNOSIS — E785 Hyperlipidemia, unspecified: Secondary | ICD-10-CM | POA: Diagnosis not present

## 2021-04-12 DIAGNOSIS — I4821 Permanent atrial fibrillation: Secondary | ICD-10-CM | POA: Diagnosis not present

## 2021-04-12 MED ORDER — AMLODIPINE BESYLATE 2.5 MG PO TABS: 2.5000 mg | ORAL_TABLET | Freq: Every day | ORAL | 1 refills | Status: DC

## 2021-04-12 MED ORDER — METOPROLOL SUCCINATE ER 50 MG PO TB24: ORAL_TABLET | ORAL | 1 refills | Status: DC

## 2021-04-12 NOTE — Progress Notes (Signed)
Cardiology Office Note   Date:  04/12/2021   ID:  [name], DOB [birth year], MRN [id number]  PCP:  Ria Bush, MD  Cardiologist:   [name], MD   Chief Complaint  Patient presents with   Other    3 month f/u c/o sob, fatigue and edema feet/ankles. Meds reviewed verbally with pt.      History of Present Illness: [name] is a 88 y.o. female who is here today for follow-up visit regarding persistent atrial fibrillation maintaining sinus rhythm with amiodarone, mild nonobstructive coronary artery disease and chronic diastolic heart failure.  Other medical problems include chronic dyspnea and fatigue, essential hypertension, hyperlipidemia, rheumatoid arthritis and peripheral arterial disease.   Most recent echocardiogram in February 2021 showed normal LV systolic function, grade 2 diastolic dysfunction, severe pulmonary hypertension, moderate mitral regurgitation and moderate tricuspid regurgitation. Right and left cardiac catheterization was done in April 2021.  It showed mild to moderate nonobstructive coronary artery disease.  Worst stenosis was 50% in the ostial right coronary artery which was not significant by fractional flow reserve.  Right heart catheterization at that time showed mildly elevated filling pressures, mild pulmonary hypertension and low normal cardiac output. She was hospitalized in July after a fall and fracture of the right wrist.  She had acute on chronic renal failure which improved after discontinuing furosemide.  She had lower extremity arterial angiogram done by Dr. Lucky Cowboy with endovascular intervention on tibial vessels bilaterally.  She had COVID-19 infection in June of 2022. She was hospitalized in July , 2022 with cholelithiasis.  She was found to have elevated LFTs and mild lactic acidosis.  She was septic with E. coli.  She was diagnosed with ascending cholangitis and was treated with antibiotics.    She was noted to be in atrial  fibrillation with RVR and September and thus the dose of amiodarone was increased and I proceeded with successful cardioversion.  However, she was noted to be back in atrial fibrillation upon follow-up and thus we elected to pursue rate control.  Amiodarone was discontinued and the dose of metoprolol was gradually increased.  She feels reasonably well although she continues to complain of fatigue and shortness of breath.  She does have left leg swelling.  Recent ABI was normal.    Past Medical History:  Diagnosis Date   Abdominal aortic atherosclerosis ([place]) 09/2015   by xray   Anxiety    BCC (basal cell carcinoma of skin) 05/2011   on nose   BCC (basal cell carcinoma of skin) 08/09/2020   upper chest, [facility] 12/27/20   Breast lesion    a. diagnosed with complex sclerosing lesion with calcifications of the right breast in 08/2016   Chest pain    a. nuclear stress test 03/2012: normal; b. 09/2018 MV: EF >65%, no isch/scar.    DDD (degenerative disc disease), lumbosacral 12/06/1992   Gallstones 09/2015   incidentally by xray   GERD (gastroesophageal reflux disease) 05/1999   HERPES ZOSTER 04/13/2007   Qualifier: Diagnosis of  By: [name], [name]    Hiatal hernia    History of shingles    ophthalmic, takes acyclovir daily   Hyperlipidemia 12/2003   Hypertension 1990   Mitral regurgitation    a. echo 03/2012: EF 60-65%, no RWMA, mild to mod AI/MR, mod TR, PASP 37 mmHg   Osteoarthritis 08/21/1989   Osteoporosis with fracture 11/1997   with compression fracture T12   Persistent atrial fibrillation ([place]) 03/16/2012  a. CHADS2VASc => 5 (HTN, age x 2, vascular disease, sex category)-->Eliquis 5 BID;  b. Recurrent AF in 06/2016;  c. 01/2017 s/p DCCV;  d. 02/2017 recurrent AFib->Amio added->DCCV; e. 03/2017 recurrent AFib, s/p reload of amio and DCCV 04/02/2017   POSTHERPETIC NEURALGIA 04/20/2007   Qualifier: Diagnosis of  By: [name], Billie-Lynn Daniels    Rheumatoid arthritis  ([place])    RLS (restless legs syndrome)     Past Surgical History:  Procedure Laterality Date   ABDOMINAL HYSTERECTOMY  Age 89 - 14   S/P TAH   abdominal ultrasound  04/23/2004   Gallstones   BREAST EXCISIONAL BIOPSY Right 2018   neg/benign complex sclerosing lesion   BREAST LUMPECTOMY WITH RADIOACTIVE SEED LOCALIZATION Right 10/2016   breast lumpectomy with radioactive seed localization and margin assessment for complex sclerosing lesion ([place])   BREAST LUMPECTOMY WITH RADIOACTIVE SEED LOCALIZATION Right 11/05/2016   Procedure: RIGHT BREAST LUMPECTOMY WITH RADIOACTIVE SEED LOCALIZATION;  Surgeon: Fanny Skates, MD;  Location: [place];  Service: General;  Laterality: Right;   CARDIAC CATHETERIZATION     CARDIOVERSION N/A 01/31/2017   Procedure: CARDIOVERSION;  Surgeon: Wellington Hampshire, MD;  Location: [facility];  Service: Cardiovascular;  Laterality: N/A;   CARDIOVERSION N/A 03/03/2017   Procedure: CARDIOVERSION;  Surgeon: Wellington Hampshire, MD;  Location: [facility];  Service: Cardiovascular;  Laterality: N/A;   CARDIOVERSION N/A 04/02/2017   Procedure: CARDIOVERSION;  Surgeon: Wellington Hampshire, MD;  Location: [facility];  Service: Cardiovascular;  Laterality: N/A;   CARDIOVERSION N/A 01/16/2018   Procedure: CARDIOVERSION;  Surgeon: Wellington Hampshire, MD;  Location: [facility];  Service: Cardiovascular;  Laterality: N/A;   CARDIOVERSION N/A 12/25/2020   Procedure: CARDIOVERSION;  Surgeon: Wellington Hampshire, MD;  Location: [facility];  Service: Cardiovascular;  Laterality: N/A;   CATARACT EXTRACTION  05/2010   left eye   CERVICAL DISCECTOMY  1992   Fusion (Dr. Joya Salm)   ERCP N/A 10/30/2020   Procedure: ENDOSCOPIC RETROGRADE CHOLANGIOPANCREATOGRAPHY (ERCP);  Surgeon: Jackquline Denmark, MD;  [facility];  Service: Endoscopy;  Laterality: N/A;   ESI Bilateral 01/2016   S1 transforaminal ESI x3   ESOPHAGOGASTRODUODENOSCOPY  01/01/2002   with ulcer, bx. neg; +  stricture gastric ulcer with hemorrhage   ESOPHAGOGASTRODUODENOSCOPY  04/23/2004   Esophageal stricture -- dilated   INTRAVASCULAR PRESSURE WIRE/FFR STUDY N/A 07/12/2019   Procedure: INTRAVASCULAR PRESSURE WIRE/FFR STUDY;  Surgeon: Wellington Hampshire, MD;  Location: [place] [facility];  Service: Cardiovascular;  Laterality: N/A;   LOWER EXTREMITY ANGIOGRAPHY Left 02/01/2019   LOWER EXTREMITY ANGIOGRAPHY;  Surgeon: Algernon Huxley, MD   LOWER EXTREMITY ANGIOGRAPHY Right 03/25/2019   Procedure: LOWER EXTREMITY ANGIOGRAPHY;  Surgeon: Algernon Huxley, MD;  Location: [place] [facility];  Service: Cardiovascular;  Laterality: Right;   LOWER EXTREMITY ANGIOGRAPHY Right 08/02/2019   Procedure: LOWER EXTREMITY ANGIOGRAPHY;  Surgeon: Algernon Huxley, MD;  Location: [place] [facility];  Service: Cardiovascular;  Laterality: Right;   nuclear stress test  03/2012   no ischemia   PERIPHERAL VASCULAR BALLOON ANGIOPLASTY Left 01/2019   Percutaneous transluminal angioplasty of left anterior and posterior tibial artery with 2.5 mm diameter by 22 cm length angioplasty balloon ([place])   REMOVAL OF STONES  10/30/2020   Procedure: REMOVAL OF STONES;  Surgeon: Jackquline Denmark, MD;  Location: [facility] END[facility];  Service: Endoscopy;;   RIGHT/LEFT HEART CATH AND CORONARY ANGIOGRAPHY Bilateral 07/12/2019   Procedure: RIGHT/LEFT HEART CATH AND CORONARY ANGIOGRAPHY;  Surgeon: Wellington Hampshire, MD;  Location: [place] [facility];  Service: Cardiovascular;  Laterality: Bilateral;   SKIN CANCER EXCISION  05/20/2011   BCC from nose   SPHINCTEROTOMY  10/30/2020   Procedure: SPHINCTEROTOMY;  Surgeon: Jackquline Denmark, MD;  Location: [facility] ENDOSCOPY;  Service: Endoscopy;;   US ECHOCARDIOGRAPHY  03/2012   in flutter, EF 60%, mild-mod aortic, mitral, tricuspid regurg, mildly dilated LA     Current Outpatient Medications  Medication Sig Dispense Refill   acetaminophen (TYLENOL) 500 MG tablet Take 500 mg by mouth every 6 (six) hours as needed for  mild pain.     acyclovir (ZOVIRAX) 400 MG tablet Take 400 mg by mouth 2 (two) times daily.     Alpha-Lipoic Acid 600 MG TABS Take 600 mg by mouth daily.     amLODipine (NORVASC) 5 MG tablet Take 1 tablet (5 mg total) by mouth daily. 90 tablet 3   apixaban (ELIQUIS) 2.5 MG TABS tablet TAKE 1 TABLET BY MOUTH TWICE DAILY 180 tablet 1   atorvastatin (LIPITOR) 20 MG tablet TAKE 1 TABLET BY MOUTH ONCE DAILY 90 tablet 3   Docusate Calcium (STOOL SOFTENER PO) Take 1 capsule by mouth daily.     DULoxetine (CYMBALTA) 60 MG capsule TAKE 1 CAPSULE BY MOUTH ONCE DAILY 90 capsule 1   gabapentin (NEURONTIN) 400 MG capsule Take 1 capsule (400 mg total) by mouth at bedtime. With second dose during day as needed 150 capsule 1   lisinopril (ZESTRIL) 20 MG tablet TAKE 1 TABLET BY MOUTH ONCE DAILY 30 tablet 1   memantine (NAMENDA) 5 MG tablet TAKE 1 TABLET BY MOUTH TWICE DAILY 60 tablet 6   metoprolol succinate (TOPROL-XL) 50 MG 24 hr tablet Take 1.5 tablets (75 mg total) by mouth in the morning and at bedtime. Take with or immediately following a meal. 270 tablet 1   pantoprazole (PROTONIX) 40 MG tablet TAKE 1 TABLET BY MOUTH ONCE DAILY 90 tablet 1   polyethylene glycol (MIRALAX / GLYCOLAX) 17 g packet Take 17 g by mouth daily as needed for mild constipation. 14 each 0   prednisoLONE acetate (PRED FORTE) 1 % ophthalmic suspension Place 1 drop into the left eye daily.      rOPINIRole (REQUIP) 1 MG tablet Take 2.5 tablets (2.5 mg total) by mouth at bedtime.     tiZANidine (ZANAFLEX) 2 MG tablet Take 2 mg by mouth daily.     traMADol (ULTRAM) 50 MG tablet Take 50-100 mg by mouth at bedtime as needed.     Vitamin D, Ergocalciferol, (DRISDOL) 1.25 MG (50000 UNIT) CAPS capsule TAKE 1 CAPSULE BY MOUTH ONCE WEEKLY 12 capsule 3   furosemide (LASIX) 20 MG tablet Take 1 tablet (20 mg total) by mouth as needed. (Patient not taking: Reported on 04/12/2021) 90 tablet 3   No current facility-administered medications for this visit.     Allergies:   Penicillins    Social History:  The patient  reports that she quit smoking about 45 years ago. Her smoking use included cigarettes. She has a 0.25 pack-year smoking history. She has never used smokeless tobacco. She reports that she does not drink alcohol and does not use drugs.   Family History:  The patient's family history includes Cancer in her brother; Diabetes in her brother, brother, brother, and sister; Emphysema in her mother; Heart disease in her brother and father; Hypertension in her brother; Stroke in her brother, father, and sister.    ROS:  Please see the  history of present illness.   Otherwise, review of systems are positive for none.   All other systems are reviewed and negative.    PHYSICAL EXAM: VS:  BP 120/64 (BP Location: Left Arm, Patient Position: Sitting, Cuff Size: Normal)    Pulse 97    Ht 5\' 2"  (1.575 m)    Wt 139 lb 4 oz (63.2 kg)    SpO2 96%    BMI 25.47 kg/m  , BMI Body mass index is 25.47 kg/m. GEN: Well nourished, well developed, in no acute distress  HEENT: normal  Neck: no JVD, carotid bruits, or masses Cardiac: Irregularly irregular,no murmurs, rubs, or gallops, mild bilateral leg edema worse on the left side with chronic stasis dermatitis. Respiratory:  clear to auscultation bilaterally, normal work of breathing GI: soft, nontender, nondistended, + BS MS: no deformity or atrophy  Skin: warm and dry, no rash Neuro:  Strength and sensation are intact Psych: euthymic mood, full affect  EKG:  EKG is ordered today. The ekg ordered today demonstrates atrial fibrillation with poor R wave progression in the anterior leads.  Ventricular rate is 97 bpm.  Recent Labs: 10/25/2020: Magnesium 1.6 11/01/2020: ALT 71 11/30/2020: TSH 4.440 01/30/2021: BUN 16; Creatinine, Ser 0.97; Hemoglobin 13.5; Platelets 239.0; Potassium 4.2; Sodium 140    Lipid Panel    Component Value Date/Time   CHOL 133 06/15/2020 1012   TRIG 70.0 06/15/2020 1012    HDL 71.70 06/15/2020 1012   CHOLHDL 2 06/15/2020 1012   VLDL 14.0 06/15/2020 1012   LDLCALC 47 06/15/2020 1012   LDLDIRECT 173.1 02/19/2010 0908      Wt Readings from Last 3 Encounters:  04/12/21 139 lb 4 oz (63.2 kg)  04/04/21 139 lb (63 kg)  01/30/21 133 lb (60.3 kg)       No flowsheet data found.    ASSESSMENT AND PLAN:  1.  Permanent atrial fibrillation: The patient likely transition to permanent atrial fibrillation.  Currently being treated with rate control and anticoagulation.  Her ventricular rate is still not optimally controlled and thus I elected to increase Toprol to 100 mg in the morning and 50 mg in the afternoon.  Continue anticoagulation with Eliquis 2.5 mg twice daily.  Her weight has fluctuated recently with current weight of 63.2 kg but her normal weight is usually below 60 and thus I am hesitant to increase the dose of Eliquis.  2. Chronic diastolic heart failure: She is likely mildly volume overloaded given increased weight of 7 pounds since October.  Her oral intake has been poor and thus her daughter has not been giving her furosemide.  Continue to monitor for now.  3. Essential hypertension: Blood pressure is controlled but given the increased dose of Toprol, I elected to decrease amlodipine to 2.5 mg daily.  4. Hyperlipidemia: Currently on atorvastatin 20 mg once daily with most recent LDL of 50.  5.  Peripheral arterial disease: Status post tibial vessel intervention managed by Dr. Lucky Cowboy.   Disposition:   FU in 3 months.  Signed,  [name], MD  04/12/2021 4:06 PM    [facility]

## 2021-04-12 NOTE — Patient Instructions (Signed)
Medication Instructions:  Your physician has recommended you make the following change in your medication:   REDUCE Amlodipine to 2.5 mg daily. An Rx has been sent to your pharmacy  CHANGE Metoprolol to 100 mg in the morning and 50 mg in the evening  *If you need a refill on your cardiac medications before your next appointment, please call your pharmacy*   Lab Work: None ordered If you have labs (blood work) drawn today and your tests are completely normal, you will receive your results only by: [place] (if you have MyChart) OR A paper copy in the mail If you have any lab test that is abnormal or we need to change your treatment, we will call you to review the results.   Testing/Procedures: None ordered   Follow-Up: At [facility], you and your health needs are our priority.  As part of our continuing mission to provide you with exceptional heart care, we have created designated Provider Care Teams.  These Care Teams include your primary Cardiologist (physician) and Advanced Practice Providers (APPs -  Physician Assistants and Nurse Practitioners) who all work together to provide you with the care you need, when you need it.  We recommend signing up for the patient portal called "MyChart".  Sign up information is provided on this After Visit Summary.  MyChart is used to connect with patients for Virtual Visits (Telemedicine).  Patients are able to view lab/test results, encounter notes, upcoming appointments, etc.  Non-urgent messages can be sent to your provider as well.   To learn more about what you can do with MyChart, go to [email or web address].    Your next appointment:   3 month(s)  The format for your next appointment:   In Person  Provider:   You may see [name], MD or one of the following Advanced Practice Providers on your designated Care Team:   Murray Hodgkins, NP Christell Faith, PA-C Cadence Kathlen Mody, PA-C{    Other Instructions N/A

## 2021-04-15 ENCOUNTER — Encounter (INDEPENDENT_AMBULATORY_CARE_PROVIDER_SITE_OTHER): Payer: Self-pay | Admitting: Nurse Practitioner

## 2021-04-15 NOTE — Progress Notes (Signed)
Subjective:    Patient ID: [name], female    DOB: 1930/02/10, 88 y.o.   MRN: [id number] Chief Complaint  Patient presents with   Follow-up    6 Mo U/S    [name] is an 88 year old female that returns to the office for followup and review of the noninvasive studies. There have been no interval changes in lower extremity symptoms. No interval shortening of the patient's claudication distance or development of rest pain symptoms. No new ulcers or wounds have occurred since the last visit.  There have been no significant changes to the patient's overall health care.  The patient denies amaurosis fugax or recent TIA symptoms. There are no recent neurological changes noted. The patient denies history of DVT, PE or superficial thrombophlebitis. The patient denies recent episodes of angina or shortness of breath.   ABI Rt=1.18 and Lt=1.18  (previous ABI's Rt=1.15 and Lt=1.03) Duplex ultrasound of the bilateral tibial arteries reveals biphasic waveforms with good toe waveforms bilaterally   Review of Systems  Cardiovascular:  Positive for leg swelling.  Skin:  Negative for wound.  All other systems reviewed and are negative.     Objective:   Physical Exam Vitals reviewed.  HENT:     Head: Normocephalic.  Cardiovascular:     Rate and Rhythm: Normal rate.     Pulses:          Dorsalis pedis pulses are 1+ on the right side and 1+ on the left side.       Posterior tibial pulses are 1+ on the right side and 1+ on the left side.  Pulmonary:     Effort: Pulmonary effort is normal.  Skin:    General: Skin is warm and dry.  Neurological:     Mental Status: She is alert and oriented to person, place, and time.  Psychiatric:        Mood and Affect: Mood normal.        Behavior: Behavior normal.        Thought Content: Thought content normal.        Judgment: Judgment normal.    BP 134/73    Pulse (!) 59    Ht 5\' 2"  (1.575 m)    Wt 139 lb (63 kg)    BMI 25.42 kg/m    Past Medical History:  Diagnosis Date   Abdominal aortic atherosclerosis ([place]) 09/2015   by xray   Anxiety    BCC (basal cell carcinoma of skin) 05/2011   on nose   BCC (basal cell carcinoma of skin) 08/09/2020   upper chest, [facility] 12/27/20   Breast lesion    a. diagnosed with complex sclerosing lesion with calcifications of the right breast in 08/2016   Chest pain    a. nuclear stress test 03/2012: normal; b. 09/2018 MV: EF >65%, no isch/scar.    DDD (degenerative disc disease), lumbosacral 12/06/1992   Gallstones 09/2015   incidentally by xray   GERD (gastroesophageal reflux disease) 05/1999   HERPES ZOSTER 04/13/2007   Qualifier: Diagnosis of  By: [name], [name]    Hiatal hernia    History of shingles    ophthalmic, takes acyclovir daily   Hyperlipidemia 12/2003   Hypertension 1990   Mitral regurgitation    a. echo 03/2012: EF 60-65%, no RWMA, mild to mod AI/MR, mod TR, PASP 37 mmHg   Osteoarthritis 08/21/1989   Osteoporosis with fracture 11/1997   with compression fracture T12  Persistent atrial fibrillation ([place]) 03/16/2012   a. CHADS2VASc => 5 (HTN, age x 2, vascular disease, sex category)-->Eliquis 5 BID;  b. Recurrent AF in 06/2016;  c. 01/2017 s/p DCCV;  d. 02/2017 recurrent AFib->Amio added->DCCV; e. 03/2017 recurrent AFib, s/p reload of amio and DCCV 04/02/2017   POSTHERPETIC NEURALGIA 04/20/2007   Qualifier: Diagnosis of  By: [name], Billie-Lynn Daniels    Rheumatoid arthritis ([place])    RLS (restless legs syndrome)     Social History   Socioeconomic History   Marital status: Widowed    Spouse name: Not on file   Number of children: 5   Years of education: Not on file   Highest education level: Not on file  Occupational History   Occupation: Retired - Occupational psychologist: RETIRED  Tobacco Use   Smoking status: Former    Packs/day: 0.25    Years: 1.00    Pack years: 0.25    Types: Cigarettes    Quit date: 04/08/1976    Years since quitting:  45.0   Smokeless tobacco: Never   Tobacco comments:    pt smokes occasionally "socially"  Vaping Use   Vaping Use: Never used  Substance and Sexual Activity   Alcohol use: No    Alcohol/week: 1.0 standard drink    Types: 1 Glasses of wine per week   Drug use: No   Sexual activity: Never  Other Topics Concern   Not on file  Social History Narrative   Lives with grandson who smokes, dog   From [place].  Widowed 01/1999; 1st marriage 25 years / 2nd marriage 13 years.   One son deceased 05-Feb-2023.   Activity: active with dog   Diet: good water, fruits/vegetables daily   Social Determinants of Health   Financial Resource Strain: Not on file  Food Insecurity: Not on file  Transportation Needs: Not on file  Physical Activity: Not on file  Stress: Not on file  Social Connections: Not on file  Intimate Partner Violence: Not on file    Past Surgical History:  Procedure Laterality Date   ABDOMINAL HYSTERECTOMY  Age 5 - 15   S/P TAH   abdominal ultrasound  04/23/2004   Gallstones   BREAST EXCISIONAL BIOPSY Right 2018   neg/benign complex sclerosing lesion   BREAST LUMPECTOMY WITH RADIOACTIVE SEED LOCALIZATION Right 10/2016   breast lumpectomy with radioactive seed localization and margin assessment for complex sclerosing lesion ([place])   BREAST LUMPECTOMY WITH RADIOACTIVE SEED LOCALIZATION Right 11/05/2016   Procedure: RIGHT BREAST LUMPECTOMY WITH RADIOACTIVE SEED LOCALIZATION;  Surgeon: Fanny Skates, MD;  Location: [place];  Service: General;  Laterality: Right;   CARDIAC CATHETERIZATION     CARDIOVERSION N/A 01/31/2017   Procedure: CARDIOVERSION;  Surgeon: Wellington Hampshire, MD;  Location: [facility];  Service: Cardiovascular;  Laterality: N/A;   CARDIOVERSION N/A 03/03/2017   Procedure: CARDIOVERSION;  Surgeon: Wellington Hampshire, MD;  Location: [facility];  Service: Cardiovascular;  Laterality: N/A;   CARDIOVERSION N/A 04/02/2017   Procedure: CARDIOVERSION;   Surgeon: Wellington Hampshire, MD;  Location: [facility];  Service: Cardiovascular;  Laterality: N/A;   CARDIOVERSION N/A 01/16/2018   Procedure: CARDIOVERSION;  Surgeon: Wellington Hampshire, MD;  Location: [facility];  Service: Cardiovascular;  Laterality: N/A;   CARDIOVERSION N/A 12/25/2020   Procedure: CARDIOVERSION;  Surgeon: Wellington Hampshire, MD;  Location: [facility];  Service: Cardiovascular;  Laterality: N/A;   CATARACT EXTRACTION  05/2010   left eye  CERVICAL DISCECTOMY  1992   Fusion (Dr. Joya Salm)   ERCP N/A 10/30/2020   Procedure: ENDOSCOPIC RETROGRADE CHOLANGIOPANCREATOGRAPHY (ERCP);  Surgeon: Jackquline Denmark, MD;  Location: [facility];  Service: Endoscopy;  Laterality: N/A;   ESI Bilateral 01/2016   S1 transforaminal ESI x3   ESOPHAGOGASTRODUODENOSCOPY  01/01/2002   with ulcer, bx. neg; + stricture gastric ulcer with hemorrhage   ESOPHAGOGASTRODUODENOSCOPY  04/23/2004   Esophageal stricture -- dilated   INTRAVASCULAR PRESSURE WIRE/FFR STUDY N/A 07/12/2019   Procedure: INTRAVASCULAR PRESSURE WIRE/FFR STUDY;  Surgeon: Wellington Hampshire, MD;  Location: [place] [facility];  Service: Cardiovascular;  Laterality: N/A;   LOWER EXTREMITY ANGIOGRAPHY Left 02/01/2019   LOWER EXTREMITY ANGIOGRAPHY;  Surgeon: Algernon Huxley, MD   LOWER EXTREMITY ANGIOGRAPHY Right 03/25/2019   Procedure: LOWER EXTREMITY ANGIOGRAPHY;  Surgeon: Algernon Huxley, MD;  Location: [place] [facility];  Service: Cardiovascular;  Laterality: Right;   LOWER EXTREMITY ANGIOGRAPHY Right 08/02/2019   Procedure: LOWER EXTREMITY ANGIOGRAPHY;  Surgeon: Algernon Huxley, MD;  Location: [place] [facility];  Service: Cardiovascular;  Laterality: Right;   nuclear stress test  03/2012   no ischemia   PERIPHERAL VASCULAR BALLOON ANGIOPLASTY Left 01/2019   Percutaneous transluminal angioplasty of left anterior and posterior tibial artery with 2.5 mm diameter by 22 cm length angioplasty balloon ([place])   REMOVAL OF STONES  10/30/2020   Procedure:  REMOVAL OF STONES;  Surgeon: Jackquline Denmark, MD;  Location: [facility] ENDOSCOPY;  Service: Endoscopy;;   RIGHT/LEFT HEART CATH AND CORONARY ANGIOGRAPHY Bilateral 07/12/2019   Procedure: RIGHT/LEFT HEART CATH AND CORONARY ANGIOGRAPHY;  Surgeon: Wellington Hampshire, MD;  Location: [place] [facility];  Service: Cardiovascular;  Laterality: Bilateral;   SKIN CANCER EXCISION  05/20/2011   BCC from nose   SPHINCTEROTOMY  10/30/2020   Procedure: SPHINCTEROTOMY;  Surgeon: Jackquline Denmark, MD;  Location: [facility];  Service: Endoscopy;;   US ECHOCARDIOGRAPHY  03/2012   in flutter, EF 60%, mild-mod aortic, mitral, tricuspid regurg, mildly dilated LA    Family History  Problem Relation Age of Onset   Emphysema Mother    Heart disease Father        MI   Stroke Father        first at age 9.   Stroke Sister    Diabetes Sister    Hypertension Brother    Heart disease Brother        Heart Valve   Stroke Brother    Diabetes Brother    Cancer Brother        esophageal   Diabetes Brother    Diabetes Brother    Colon cancer Neg Hx     Allergies  Allergen Reactions   Penicillins Rash and Other (See Comments)    Childhood Allergy Has patient had a PCN reaction causing immediate rash, facial/tongue/throat swelling, SOB or lightheadedness with hypotension: Yes Has patient had a PCN reaction causing severe rash involving mucus membranes or skin necrosis: Unknown Has patient had a PCN reaction that required hospitalization: No Has patient had a PCN reaction occurring within the last 10 years: No If all of the above answers are "NO", then may proceed with Cephalosporin use.     CBC Latest Ref Rng & Units 01/30/2021 12/21/2020 11/01/2020  WBC 4.0 - 10.5 K/uL 6.5 7.2 8.0  Hemoglobin 12.0 - 15.0 g/dL 13.5 13.0 10.4(L)  Hematocrit 36.0 - 46.0 % 40.9 38.4 32.4(L)  Platelets 150.0 - 400.0 K/uL 239.0 237 263  CMP     Component Value Date/Time   NA 140 01/30/2021 1601   NA 143 12/21/2020 1546   K 4.2  01/30/2021 1601   CL 104 01/30/2021 1601   CO2 28 01/30/2021 1601   GLUCOSE 103 (H) 01/30/2021 1601   BUN 16 01/30/2021 1601   BUN 15 12/21/2020 1546   CREATININE 0.97 01/30/2021 1601   CREATININE 0.81 01/30/2018 1558   CALCIUM 9.2 01/30/2021 1601   PROT 4.9 (L) 11/01/2020 0338   PROT 6.1 01/07/2018 1515   ALBUMIN 4.1 01/30/2021 1601   ALBUMIN 4.1 01/07/2018 1515   AST 23 11/01/2020 0338   ALT 71 (H) 11/01/2020 0338   ALKPHOS 151 (H) 11/01/2020 0338   BILITOT 0.6 11/01/2020 0338   BILITOT 0.5 01/07/2018 1515   GFRNONAA 57 (L) 11/01/2020 0338   GFRAA 43 (L) 10/27/2019 0452     VAS [place] ABI WITH/WO TBI  Result Date: 04/06/2021  LOWER EXTREMITY DOPPLER STUDY Patient Name:  PATRICIA FARGO  Date of Exam:   04/04/2021 Medical Rec #: [id number]        Accession #:    [id number] Date of Birth: 22-Aug-1929        Patient Gender: F Patient Age:   46 years Exam Location:  [facility] Procedure:      VAS [place] ABI WITH/WO TBI Referring Phys: --------------------------------------------------------------------------------  Indications: Peripheral artery disease.  Vascular Interventions: 08/02/19 rt PTA of calf vessels. Performing Technologist: Concha Norway RVT  Examination Guidelines: A complete evaluation includes at minimum, Doppler waveform signals and systolic blood pressure reading at the level of bilateral brachial, anterior tibial, and posterior tibial arteries, when vessel segments are accessible. Bilateral testing is considered an integral part of a complete examination. Photoelectric Plethysmograph (PPG) waveforms and toe systolic pressure readings are included as required and additional duplex testing as needed. Limited examinations for reoccurring indications may be performed as noted.  ABI Findings: +---------+------------------+-----+--------+--------+  Right     Rt Pressure (mmHg) Index Waveform Comment   +---------+------------------+-----+--------+--------+  Brachial  148                                          +---------+------------------+-----+--------+--------+  ATA       170                      biphasic 1.15      +---------+------------------+-----+--------+--------+  PTA       174                1.18  biphasic           +---------+------------------+-----+--------+--------+  Great Toe 101                0.68  Normal             +---------+------------------+-----+--------+--------+ +---------+------------------+-----+--------+-------+  Left      Lt Pressure (mmHg) Index Waveform Comment  +---------+------------------+-----+--------+-------+  ATA       169                      biphasic 1.14     +---------+------------------+-----+--------+-------+  PTA       174                1.18  biphasic          +---------+------------------+-----+--------+-------+  Great Toe 64  0.43  Abnormal          +---------+------------------+-----+--------+-------+ +-------+-----------+-----------+------------+------------+  ABI/TBI Today's ABI Today's TBI Previous ABI Previous TBI  +-------+-----------+-----------+------------+------------+  Right   1.18        .68         1.15         .66           +-------+-----------+-----------+------------+------------+  Left    1.18        .43         1.03         .43           +-------+-----------+-----------+------------+------------+ Bilateral ABIs appear essentially unchanged compared to prior study on 09/2020.  Summary: Right: Resting right ankle-brachial index is within normal range. No evidence of significant right lower extremity arterial disease. The right toe-brachial index is normal. Left: Resting left ankle-brachial index is within normal range. No evidence of significant left lower extremity arterial disease. The left toe-brachial index is abnormal.  *See table(s) above for measurements and observations.  Electronically signed by Leotis Pain MD on 04/06/2021 at 10:02:59 AM.    Final        Assessment & Plan:   1. PAD (peripheral artery  disease) (HCC)  Recommend:  The patient has evidence of atherosclerosis of the lower extremities with claudication.  The patient does not voice lifestyle limiting changes at this point in time.  Noninvasive studies do not suggest clinically significant change.  No invasive studies, angiography or surgery at this time The patient should continue walking and begin a more formal exercise program.  The patient should continue antiplatelet therapy and aggressive treatment of the lipid abnormalities  No changes in the patient's medications at this time  The patient should continue wearing graduated compression socks 10-15 mmHg strength to control the mild edema.    2. Essential hypertension Continue antihypertensive medications as already ordered, these medications have been reviewed and there are no changes at this time.   3. Mixed hyperlipidemia Continue statin as ordered and reviewed, no changes at this time    Current Outpatient Medications on File Prior to Visit  Medication Sig Dispense Refill   acetaminophen (TYLENOL) 500 MG tablet Take 500 mg by mouth every 6 (six) hours as needed for mild pain.     acyclovir (ZOVIRAX) 400 MG tablet Take 400 mg by mouth 2 (two) times daily.     Alpha-Lipoic Acid 600 MG TABS Take 600 mg by mouth daily.     apixaban (ELIQUIS) 2.5 MG TABS tablet TAKE 1 TABLET BY MOUTH TWICE DAILY 180 tablet 1   atorvastatin (LIPITOR) 20 MG tablet TAKE 1 TABLET BY MOUTH ONCE DAILY 90 tablet 3   Docusate Calcium (STOOL SOFTENER PO) Take 1 capsule by mouth daily.     DULoxetine (CYMBALTA) 60 MG capsule TAKE 1 CAPSULE BY MOUTH ONCE DAILY 90 capsule 1   gabapentin (NEURONTIN) 400 MG capsule Take 1 capsule (400 mg total) by mouth at bedtime. With second dose during day as needed 150 capsule 1   lisinopril (ZESTRIL) 20 MG tablet TAKE 1 TABLET BY MOUTH ONCE DAILY 30 tablet 1   memantine (NAMENDA) 5 MG tablet TAKE 1 TABLET BY MOUTH TWICE DAILY 60 tablet 6   pantoprazole  (PROTONIX) 40 MG tablet TAKE 1 TABLET BY MOUTH ONCE DAILY 90 tablet 1   polyethylene glycol (MIRALAX / GLYCOLAX) 17 g packet Take 17 g by mouth daily as needed for mild constipation. 14 each 0  prednisoLONE acetate (PRED FORTE) 1 % ophthalmic suspension Place 1 drop into the left eye daily.      rOPINIRole (REQUIP) 1 MG tablet Take 2.5 tablets (2.5 mg total) by mouth at bedtime.     tiZANidine (ZANAFLEX) 2 MG tablet Take 2 mg by mouth daily.     traMADol (ULTRAM) 50 MG tablet Take 50-100 mg by mouth at bedtime as needed.     Vitamin D, Ergocalciferol, (DRISDOL) 1.25 MG (50000 UNIT) CAPS capsule TAKE 1 CAPSULE BY MOUTH ONCE WEEKLY 12 capsule 3   furosemide (LASIX) 20 MG tablet Take 1 tablet (20 mg total) by mouth as needed. (Patient not taking: Reported on 04/12/2021) 90 tablet 3   No current facility-administered medications on file prior to visit.    There are no Patient Instructions on file for this visit. No follow-ups on file.   Kris Hartmann, NP

## 2021-04-16 ENCOUNTER — Other Ambulatory Visit: Payer: Self-pay | Admitting: Family Medicine

## 2021-04-16 NOTE — Telephone Encounter (Signed)
Refill request gabapentin Last office visit 01/30/21 Last refill 08/18/20 #150/1

## 2021-04-26 ENCOUNTER — Encounter: Payer: Self-pay | Admitting: Dermatology

## 2021-04-26 ENCOUNTER — Other Ambulatory Visit: Payer: Self-pay

## 2021-04-26 ENCOUNTER — Ambulatory Visit: Payer: Medicare Other | Admitting: Dermatology

## 2021-04-26 DIAGNOSIS — Z1283 Encounter for screening for malignant neoplasm of skin: Secondary | ICD-10-CM

## 2021-04-26 DIAGNOSIS — L578 Other skin changes due to chronic exposure to nonionizing radiation: Secondary | ICD-10-CM | POA: Diagnosis not present

## 2021-04-26 DIAGNOSIS — D492 Neoplasm of unspecified behavior of bone, soft tissue, and skin: Secondary | ICD-10-CM

## 2021-04-26 DIAGNOSIS — L905 Scar conditions and fibrosis of skin: Secondary | ICD-10-CM | POA: Diagnosis not present

## 2021-04-26 DIAGNOSIS — B353 Tinea pedis: Secondary | ICD-10-CM | POA: Diagnosis not present

## 2021-04-26 DIAGNOSIS — L814 Other melanin hyperpigmentation: Secondary | ICD-10-CM

## 2021-04-26 DIAGNOSIS — D1800 Hemangioma unspecified site: Secondary | ICD-10-CM | POA: Diagnosis not present

## 2021-04-26 DIAGNOSIS — L821 Other seborrheic keratosis: Secondary | ICD-10-CM | POA: Diagnosis not present

## 2021-04-26 DIAGNOSIS — D229 Melanocytic nevi, unspecified: Secondary | ICD-10-CM

## 2021-04-26 DIAGNOSIS — Z85828 Personal history of other malignant neoplasm of skin: Secondary | ICD-10-CM | POA: Diagnosis not present

## 2021-04-26 DIAGNOSIS — L570 Actinic keratosis: Secondary | ICD-10-CM

## 2021-04-26 DIAGNOSIS — C44529 Squamous cell carcinoma of skin of other part of trunk: Secondary | ICD-10-CM | POA: Diagnosis not present

## 2021-04-26 NOTE — Patient Instructions (Addendum)
Cryotherapy Aftercare  Wash gently with soap and water everyday.   Apply Vaseline and Band-Aid daily until healed.   Prior to procedure, discussed risks of blister formation, small wound, skin dyspigmentation, or rare scar following cryotherapy. Recommend Vaseline ointment to treated areas while healing.   Wound Care Instructions  Cleanse wound gently with soap and water once a day then pat dry with clean gauze. Apply a thing coat of Petrolatum (petroleum jelly, "Vaseline") over the wound (unless you have an allergy to this). We recommend that you use a new, sterile tube of Vaseline. Do not pick or remove scabs. Do not remove the yellow or white "healing tissue" from the base of the wound.  Cover the wound with fresh, clean, nonstick gauze and secure with paper tape. You may use Band-Aids in place of gauze and tape if the would is small enough, but would recommend trimming much of the tape off as there is often too much. Sometimes Band-Aids can irritate the skin.  You should call the office for your biopsy report after 1 week if you have not already been contacted.  If you experience any problems, such as abnormal amounts of bleeding, swelling, significant bruising, significant pain, or evidence of infection, please call the office immediately.  FOR ADULT SURGERY PATIENTS: If you need something for pain relief you may take 1 extra strength Tylenol (acetaminophen) AND 2 Ibuprofen (200mg  each) together every 4 hours as needed for pain. (do not take these if you are allergic to them or if you have a reason you should not take them.) Typically, you may only need pain medication for 1 to 3 days.   Melanoma ABCDEs  Melanoma is the most dangerous type of skin cancer, and is the leading cause of death from skin disease.  You are more likely to develop melanoma if you: Have light-colored skin, light-colored eyes, or red or blond hair Spend a lot of time in the sun Tan regularly, either outdoors or in  a tanning bed Have had blistering sunburns, especially during childhood Have a close family member who has had a melanoma Have atypical moles or large birthmarks  Early detection of melanoma is key since treatment is typically straightforward and cure rates are extremely high if we catch it early.   The first sign of melanoma is often a change in a mole or a new dark spot.  The ABCDE system is a way of remembering the signs of melanoma.  A for asymmetry:  The two halves do not match. B for border:  The edges of the growth are irregular. C for color:  A mixture of colors are present instead of an even brown color. D for diameter:  Melanomas are usually (but not always) greater than 66mm - the size of a pencil eraser. E for evolution:  The spot keeps changing in size, shape, and color.  Please check your skin once per month between visits. You can use a small mirror in front and a large mirror behind you to keep an eye on the back side or your body.   If you see any new or changing lesions before your next follow-up, please call to schedule a visit.  Please continue daily skin protection including broad spectrum sunscreen SPF 30+ to sun-exposed areas, reapplying every 2 hours as needed when you're outdoors.   Staying in the shade or wearing long sleeves, sun glasses (UVA+UVB protection) and wide brim hats (4-inch brim around the entire circumference of the hat) are  also recommended for sun protection.    If You Need Anything After Your Visit  If you have any questions or concerns for your doctor, please call our main line at [phone] and press option 4 to reach your doctor's medical assistant. If no one answers, please leave a voicemail as directed and we will return your call as soon as possible. Messages left after 4 pm will be answered the following business day.   You may also send [place] a message via [place]. We typically respond to MyChart messages within 1-2 business days.  For  prescription refills, please ask your pharmacy to contact our office. Our fax number is [phone].  If you have an urgent issue when the clinic is closed that cannot wait until the next business day, you can page your doctor at the number below.    Please note that while we do our best to be available for urgent issues outside of office hours, we are not available 24/7.   If you have an urgent issue and are unable to reach [place], you may choose to seek medical care at your doctor's office, retail clinic, urgent care center, or emergency room.  If you have a medical emergency, please immediately call 911 or go to the emergency department.  Pager Numbers  - Dr. Nehemiah Massed: [phone]  - Dr. Laurence Ferrari: [phone]  - Dr. Nicole Kindred: [phone]  In the event of inclement weather, please call our main line at [phone] for an update on the status of any delays or closures.  Dermatology Medication Tips: Please keep the boxes that topical medications come in in order to help keep track of the instructions about where and how to use these. Pharmacies typically print the medication instructions only on the boxes and not directly on the medication tubes.   If your medication is too expensive, please contact our office at [phone] option 4 or send [place] a message through [place].   We are unable to tell what your co-pay for medications will be in advance as this is different depending on your insurance coverage. However, we may be able to find a substitute medication at lower cost or fill out paperwork to get insurance to cover a needed medication.   If a prior authorization is required to get your medication covered by your insurance company, please allow [place] 1-2 business days to complete this process.  Drug prices often vary depending on where the prescription is filled and some pharmacies may offer cheaper prices.  The website www.goodrx.com contains coupons for medications through different  pharmacies. The prices here do not account for what the cost may be with help from insurance (it may be cheaper with your insurance), but the website can give you the price if you did not use any insurance.  - You can print the associated coupon and take it with your prescription to the pharmacy.  - You may also stop by our office during regular business hours and pick up a GoodRx coupon card.  - If you need your prescription sent electronically to a different pharmacy, notify our office through [facility] or by phone at [phone] option 4.     Si Usted Necesita Algo Despus de Su Visita  Tambin puede enviarnos un mensaje a travs de Pharmacist, community. Por lo general respondemos a los mensajes de MyChart en el transcurso de 1 a 2 das hbiles.  Para renovar recetas, por favor pida a su farmacia que se ponga en contacto con nuestra oficina. Oswald Hillock  [facility] [phone].  Si tiene un asunto urgente cuando la clnica est cerrada y que no puede esperar hasta el siguiente da hbil, puede llamar/localizar a su doctor(a) al nmero que aparece a continuacin.   Por favor, tenga en cuenta que aunque hacemos todo lo posible para estar disponibles para asuntos urgentes fuera del horario de [place], no estamos disponibles las 24 horas del da, los 7 das de la [place].   Si tiene un problema urgente y no puede comunicarse con nosotros, puede optar por buscar atencin mdica  en el consultorio de su doctor(a), en una clnica privada, en un centro de atencin urgente o en una sala de emergencias.  Si tiene Engineering geologist, por favor llame inmediatamente al 911 o vaya a la sala de emergencias.  Nmeros de bper  - Dr. Nehemiah Massed: [phone]  - Dra. Moye: [phone]  - Dra. Nicole Kindred: [phone]  En caso de inclemencias del [place], por favor llame a Johnsie Kindred principal al [phone] para una actualizacin sobre el [place] de cualquier retraso o cierre.  Consejos para la  medicacin en dermatologa: Por favor, guarde las cajas en las que vienen los medicamentos de uso tpico para ayudarle a seguir las instrucciones sobre dnde y cmo usarlos. Las farmacias generalmente imprimen las instrucciones del medicamento slo en las cajas y no directamente en los tubos del [place].   Si su medicamento es muy caro, por favor, pngase en contacto con Zigmund Daniel llamando al [phone] y presione la opcin 4 o envenos un mensaje a travs de Pharmacist, community.   No podemos decirle cul ser su copago por los medicamentos por adelantado ya que esto es diferente dependiendo de la cobertura de su seguro. Sin embargo, es posible que podamos encontrar un medicamento sustituto a Electrical engineer un formulario para que el seguro cubra el medicamento que se considera necesario.   Si se requiere una autorizacin previa para que su compaa de seguros [place] su medicamento, por favor permtanos de 1 a 2 das hbiles para completar este proceso.  Los precios de los medicamentos varan con frecuencia dependiendo del Environmental consultant de dnde se surte la receta y alguna farmacias pueden ofrecer precios ms baratos.  El sitio web www.goodrx.com tiene cupones para medicamentos de Airline pilot. Los precios aqu no tienen en cuenta lo que podra costar con la ayuda del seguro (puede ser ms barato con su seguro), pero el sitio web puede darle el precio si no utiliz Research scientist (physical sciences).  - Puede imprimir el cupn correspondiente y llevarlo con su receta a la farmacia.  - Tambin puede pasar por nuestra oficina durante el horario de atencin regular y Charity fundraiser una tarjeta de cupones de GoodRx.  - Si necesita que su receta se enve electrnicamente a una farmacia diferente, informe a nuestra oficina a travs de MyChart de [place] o por telfono llamando al [phone] y presione la opcin 4.

## 2021-04-26 NOTE — Progress Notes (Signed)
Follow-Up Visit   Subjective  [name] is a 88 y.o. female who presents for the following: Annual Exam (Here for skin cancer screening. Hx of BCC's).  Daughter with patient.   The following portions of the chart were reviewed this encounter and updated as appropriate:  Tobacco   Allergies   Meds   Problems   Med Hx   Surg Hx   Fam Hx       Review of Systems: No other skin or systemic complaints except as noted in HPI or Assessment and Plan.   Objective  Well appearing patient in no apparent distress; mood and affect are within normal limits.  A full examination was performed including scalp, head, eyes, ears, nose, lips, neck, chest, axillae, abdomen, back, buttocks, bilateral upper extremities, bilateral lower extremities, hands, feet, fingers, toes, fingernails, and toenails. All findings within normal limits unless otherwise noted below.  Right Dorsal Hand x2, right forearm x1, left upper back (hypertrophic) x2 (5) Erythematous thin papules/macules with gritty scale.   Right chest Indurated plaque  B/L feet Scaling and maceration web spaces and over distal and lateral soles.   Right Posterior Flank inferior to axilla 0.8cm pink papule with cutaneous horn       Assessment & Plan  AK (actinic keratosis) (5) Right Dorsal Hand x2, right forearm x1, left upper back (hypertrophic) x2  Hypertrophic   Actinic keratoses are precancerous spots that appear secondary to cumulative UV radiation exposure/sun exposure over time. They are chronic with expected duration over 1 year. A portion of actinic keratoses will progress to squamous cell carcinoma of the skin. It is not possible to reliably predict which spots will progress to skin cancer and so treatment is recommended to prevent development of skin cancer.  Recommend daily broad spectrum sunscreen SPF 30+ to sun-exposed areas, reapply every 2 hours as needed.  Recommend staying in the shade or wearing long sleeves,  sun glasses (UVA+UVB protection) and wide brim hats (4-inch brim around the entire circumference of the hat). Call for new or changing lesions.  Prior to procedure, discussed risks of blister formation, small wound, skin dyspigmentation, or rare scar following cryotherapy. Recommend Vaseline ointment to treated areas while healing.   RTC 4-6 weeks if not resolved.   Destruction of lesion - Right Dorsal Hand x2, right forearm x1, left upper back (hypertrophic) x2  Destruction method: cryotherapy   Informed consent: discussed and consent obtained   Lesion destroyed using liquid nitrogen: Yes   Outcome: patient tolerated procedure well with no complications   Post-procedure details: wound care instructions given    Scar Right chest  Hypertrophic scar secondary to fall  Not bothersome, defer treatment  Tinea pedis of left foot B/L feet  Patient thinks she has Rx topical antifungal at home. Will call with name of medication. If she cannot find this medication she will call and we will send ciclopirox cream daily until clear then one additional week, then once a week for maintenance.   Neoplasm of skin Right Posterior Flank inferior to axilla  Skin / nail biopsy Type of biopsy: tangential   Informed consent: discussed and consent obtained   Anesthesia: the lesion was anesthetized in a standard fashion   Anesthesia comment:  Area prepped with alcohol Anesthetic:  1% lidocaine w/ epinephrine 1-100,000 buffered w/ 8.4% NaHCO3 Instrument used: flexible razor blade   Hemostasis achieved with: pressure, aluminum chloride and electrodesiccation   Outcome: patient tolerated procedure well   Post-procedure details: wound  care instructions given   Post-procedure details comment:  Ointment and small bandage applied  Specimen 1 - Surgical pathology Differential Diagnosis: R/O SCC  Check Margins: No   Lentigines - Scattered tan macules - Due to sun exposure - Benign-appearing,  observe - Recommend daily broad spectrum sunscreen SPF 30+ to sun-exposed areas, reapply every 2 hours as needed. - Call for any changes  Seborrheic Keratoses - Stuck-on, waxy, tan-brown papules and/or plaques  - Benign-appearing - Discussed benign etiology and prognosis. - Observe - Call for any changes  Melanocytic Nevi - Tan-brown and/or pink-flesh-colored symmetric macules and papules - Benign appearing on exam today - Observation - Call clinic for new or changing moles - Recommend daily use of broad spectrum spf 30+ sunscreen to sun-exposed areas.   Hemangiomas - Red papules - Discussed benign nature - Observe - Call for any changes  Actinic Damage - Chronic condition, secondary to cumulative UV/sun exposure - diffuse scaly erythematous macules with underlying dyspigmentation - Recommend daily broad spectrum sunscreen SPF 30+ to sun-exposed areas, reapply every 2 hours as needed.  - Staying in the shade or wearing long sleeves, sun glasses (UVA+UVB protection) and wide brim hats (4-inch brim around the entire circumference of the hat) are also recommended for sun protection.  - Call for new or changing lesions.  Skin cancer screening performed today.  History of Basal Cell Carcinoma of the Skin - No evidence of recurrence today at nose and upper chest - Recommend regular full body skin exams - Recommend daily broad spectrum sunscreen SPF 30+ to sun-exposed areas, reapply every 2 hours as needed.  - Call if any new or changing lesions are noted between office visits    Return in about 6 months (around 10/24/2021) for TBSE.   I, Emelia Salisbury, CMA, am acting as scribe for Forest Gleason, MD.  Documentation: I have reviewed the above documentation for accuracy and completeness, and I agree with the above.  Forest Gleason, MD

## 2021-04-30 ENCOUNTER — Other Ambulatory Visit: Payer: Self-pay | Admitting: Family Medicine

## 2021-04-30 NOTE — Telephone Encounter (Signed)
Refill request Vitamin D Last refill 04/25/20 #12/3 Last office visit 01/30/21 Please confirm that patient is to continue weekly Vitamin D No upcoming appointment scheduled

## 2021-05-01 ENCOUNTER — Other Ambulatory Visit: Payer: Self-pay

## 2021-05-01 ENCOUNTER — Ambulatory Visit: Payer: Medicare Other | Admitting: Dermatology

## 2021-05-01 ENCOUNTER — Ambulatory Visit: Payer: Medicare Other | Admitting: Podiatry

## 2021-05-01 ENCOUNTER — Telehealth: Payer: Self-pay

## 2021-05-01 DIAGNOSIS — M79675 Pain in left toe(s): Secondary | ICD-10-CM | POA: Diagnosis not present

## 2021-05-01 DIAGNOSIS — B351 Tinea unguium: Secondary | ICD-10-CM

## 2021-05-01 DIAGNOSIS — I739 Peripheral vascular disease, unspecified: Secondary | ICD-10-CM

## 2021-05-01 DIAGNOSIS — C4492 Squamous cell carcinoma of skin, unspecified: Secondary | ICD-10-CM

## 2021-05-01 DIAGNOSIS — M79674 Pain in right toe(s): Secondary | ICD-10-CM | POA: Diagnosis not present

## 2021-05-01 HISTORY — DX: Squamous cell carcinoma of skin, unspecified: C44.92

## 2021-05-01 NOTE — Progress Notes (Addendum)
Entered in error

## 2021-05-01 NOTE — Telephone Encounter (Addendum)
Called patient and spoke to patient's daughter concerning biopsy results. Patient's daughter would like more information about diagnosis. Offered pamphlet regarding results. Patient scheduled for [facility]. Patient's daughter will come by and pick up.   Daughter denied further questions at this time.     ----- Message from Alfonso Patten, MD sent at 05/01/2021 10:58 AM EST ----- Skin , right posterior flank inferior to axilla WELL DIFFERENTIATED SQUAMOUS CELL CARCINOMA WITH SUPERFICIAL INFILTRATION, BASE INVOLVED --> ED&C within 2 months  Would also recheck Aks treated at last visit at same visit  MAs please call and schedule. Thank you!

## 2021-05-07 ENCOUNTER — Encounter: Payer: Self-pay | Admitting: Dermatology

## 2021-05-08 ENCOUNTER — Encounter: Payer: Self-pay | Admitting: Podiatry

## 2021-05-08 NOTE — Progress Notes (Signed)
°  Subjective:  Patient ID: [name], female    DOB: 05-Jan-1930,  MRN: [id number]  Chief Complaint  Patient presents with   Nail Problem    Nail trim    88 y.o. female returns for the above complaint.  Patient presents with thickened elongated dystrophic toenails x10.  Patient states that they are painful to touch.  She is not able to take care of her self.  Patient has a history of peripheral vascular disease without good circulation to both lower extremity.   Objective:  There were no vitals filed for this visit. Podiatric Exam: Vascular: dorsalis pedis and posterior tibial pulses are non palpable bilateral. Capillary return is sluggish temperature gradient is WNL. Skin turgor WNL.  Dependent rubor noted to bilateral lower extremity Sensorium: Normal Semmes Weinstein monofilament test. Normal tactile sensation bilaterally. Nail Exam: Pt has thick disfigured discolored nails with subungual debris noted bilateral entire nail hallux through fifth toenails.  Pain on palpation to the nails. Ulcer Exam: There is no evidence of ulcer or pre-ulcerative changes or infection. Orthopedic Exam: Muscle tone and strength are WNL. No limitations in general ROM. No crepitus or effusions noted. HAV  B/L.  Hammer toes 2-5  B/L. Skin: No Porokeratosis. No infection or ulcers    Assessment & Plan:   1. Pain due to onychomycosis of toenails of both feet   2. Peripheral vascular disease ([place])       Patient was evaluated and treated and all questions answered.  Right lateral submetatarsal 5 ulceration -This wound is primarily managed by the wound care center.  I will defer further recommendation to them.  Onychomycosis with pain  -Nails palliatively debrided as below. -Educated on self-care  Procedure: Nail Debridement Rationale: pain  Type of Debridement: manual, sharp debridement. Instrumentation: Nail nipper, rotary burr. Number of Nails: 10  Procedures and Treatment: Consent by  patient was obtained for treatment procedures. The patient understood the discussion of treatment and procedures well. All questions were answered thoroughly reviewed. Debridement of mycotic and hypertrophic toenails, 1 through 5 bilateral and clearing of subungual debris. No ulceration, no infection noted.  Return Visit-Office Procedure: Patient instructed to return to the office for a follow up visit 3 months for continued evaluation and treatment.  Boneta Lucks, DPM    No follow-ups on file.

## 2021-05-10 ENCOUNTER — Telehealth: Payer: Self-pay | Admitting: Family Medicine

## 2021-05-10 NOTE — Chronic Care Management (AMB) (Signed)
°  Chronic Care Management   Note  05/10/2021 Name: KEYSHAWNA PROUSE MRN: [id number] DOB: [birth year]  CHANIQUA BRISBY is a 88 y.o. year old female who is a primary care patient of Ria Bush, MD. I reached out to [name] by phone today in response to a referral sent by Ms. Joline Salt [id number]'P PCP, Ria Bush, MD.   Ms. Koenigsberg was given information about Chronic Care Management services today including:  CCM service includes personalized support from designated clinical staff supervised by her physician, including individualized plan of care and coordination with other care providers 24/7 contact phone numbers for assistance for urgent and routine care needs. Service will only be billed when office clinical staff spend 20 minutes or more in a month to coordinate care. Only one practitioner may furnish and bill the service in a calendar month. The patient may stop CCM services at any time (effective at the end of the month) by phone call to the office staff.   PATRICIA/ DAUGHTER  verbally agreed to assistance and services provided by embedded care coordination/care management team today.  Follow up plan:   Tatjana Secretary/administrator

## 2021-05-15 ENCOUNTER — Other Ambulatory Visit: Payer: Self-pay | Admitting: Cardiovascular Disease

## 2021-05-15 ENCOUNTER — Other Ambulatory Visit: Payer: Self-pay | Admitting: Family Medicine

## 2021-05-17 NOTE — Telephone Encounter (Signed)
ERx 

## 2021-05-17 NOTE — Telephone Encounter (Signed)
Requip Last filled:  02/13/21, #225 Last OV:  01/30/21, f/u Next OV:  none

## 2021-05-30 ENCOUNTER — Ambulatory Visit (INDEPENDENT_AMBULATORY_CARE_PROVIDER_SITE_OTHER)
Admission: RE | Admit: 2021-05-30 | Discharge: 2021-05-30 | Disposition: A | Discharge status: Home / Self Care | Payer: Medicare Other | Source: Ambulatory Visit | Attending: Family Medicine | Admitting: Family Medicine

## 2021-05-30 ENCOUNTER — Ambulatory Visit (INDEPENDENT_AMBULATORY_CARE_PROVIDER_SITE_OTHER): Payer: Medicare Other | Admitting: Family Medicine

## 2021-05-30 ENCOUNTER — Other Ambulatory Visit: Payer: Self-pay

## 2021-05-30 ENCOUNTER — Encounter: Payer: Self-pay | Admitting: Family Medicine

## 2021-05-30 VITALS — BP 132/76 | HR 72 | Temp 97.5°F | Wt 140.0 lb

## 2021-05-30 DIAGNOSIS — I272 Pulmonary hypertension, unspecified: Secondary | ICD-10-CM

## 2021-05-30 DIAGNOSIS — R0609 Other forms of dyspnea: Secondary | ICD-10-CM

## 2021-05-30 DIAGNOSIS — K649 Unspecified hemorrhoids: Secondary | ICD-10-CM | POA: Diagnosis not present

## 2021-05-30 DIAGNOSIS — I4819 Other persistent atrial fibrillation: Secondary | ICD-10-CM

## 2021-05-30 DIAGNOSIS — E538 Deficiency of other specified B group vitamins: Secondary | ICD-10-CM | POA: Diagnosis not present

## 2021-05-30 DIAGNOSIS — G2581 Restless legs syndrome: Secondary | ICD-10-CM | POA: Diagnosis not present

## 2021-05-30 DIAGNOSIS — I10 Essential (primary) hypertension: Secondary | ICD-10-CM | POA: Diagnosis not present

## 2021-05-30 DIAGNOSIS — I5032 Chronic diastolic (congestive) heart failure: Secondary | ICD-10-CM | POA: Diagnosis not present

## 2021-05-30 DIAGNOSIS — R5383 Other fatigue: Secondary | ICD-10-CM

## 2021-05-30 DIAGNOSIS — N1831 Chronic kidney disease, stage 3a: Secondary | ICD-10-CM | POA: Diagnosis not present

## 2021-05-30 DIAGNOSIS — D509 Iron deficiency anemia, unspecified: Secondary | ICD-10-CM | POA: Diagnosis not present

## 2021-05-30 DIAGNOSIS — K5909 Other constipation: Secondary | ICD-10-CM

## 2021-05-30 DIAGNOSIS — R0600 Dyspnea, unspecified: Secondary | ICD-10-CM | POA: Diagnosis not present

## 2021-05-30 DIAGNOSIS — I517 Cardiomegaly: Secondary | ICD-10-CM | POA: Diagnosis not present

## 2021-05-30 LAB — CBC WITH DIFFERENTIAL/PLATELET
Basophils Absolute: 0.1 10*3/uL (ref 0.0–0.1)
Basophils Relative: 1.1 % (ref 0.0–3.0)
Eosinophils Absolute: 0.1 10*3/uL (ref 0.0–0.7)
Eosinophils Relative: 1.7 % (ref 0.0–5.0)
HCT: 40.9 % (ref 36.0–46.0)
Hemoglobin: 13.5 g/dL (ref 12.0–15.0)
Lymphocytes Relative: 21.5 % (ref 12.0–46.0)
Lymphs Abs: 1.5 10*3/uL (ref 0.7–4.0)
MCHC: 32.9 g/dL (ref 30.0–36.0)
MCV: 96.5 fl (ref 78.0–100.0)
Monocytes Absolute: 0.5 10*3/uL (ref 0.1–1.0)
Monocytes Relative: 6.7 % (ref 3.0–12.0)
Neutro Abs: 4.9 10*3/uL (ref 1.4–7.7)
Neutrophils Relative %: 69 % (ref 43.0–77.0)
Platelets: 232 10*3/uL (ref 150.0–400.0)
RBC: 4.24 Mil/uL (ref 3.87–5.11)
RDW: 13.8 % (ref 11.5–15.5)
WBC: 7.1 10*3/uL (ref 4.0–10.5)

## 2021-05-30 LAB — IRON: Iron: 54 ug/dL (ref 42–145)

## 2021-05-30 LAB — COMPREHENSIVE METABOLIC PANEL
ALT: 10 U/L (ref 0–35)
AST: 13 U/L (ref 0–37)
Albumin: 3.9 g/dL (ref 3.5–5.2)
Alkaline Phosphatase: 55 U/L (ref 39–117)
BUN: 13 mg/dL (ref 6–23)
CO2: 33 mEq/L — ABNORMAL HIGH (ref 19–32)
Calcium: 9.1 mg/dL (ref 8.4–10.5)
Chloride: 103 mEq/L (ref 96–112)
Creatinine, Ser: 0.84 mg/dL (ref 0.40–1.20)
GFR: 61.37 mL/min (ref >60.00)
Glucose, Bld: 93 mg/dL (ref 70–99)
Potassium: 4.4 mEq/L (ref 3.5–5.1)
Sodium: 139 mEq/L (ref 135–145)
Total Bilirubin: 0.6 mg/dL (ref 0.2–1.2)
Total Protein: 6.2 g/dL (ref 6.0–8.3)

## 2021-05-30 LAB — BRAIN NATRIURETIC PEPTIDE: Pro B Natriuretic peptide (BNP): 305 pg/mL — ABNORMAL HIGH (ref 0.0–100.0)

## 2021-05-30 LAB — VITAMIN B12: Vitamin B-12: 1183 pg/mL — ABNORMAL HIGH (ref 211–911)

## 2021-05-30 LAB — TSH: TSH: 2.44 u[IU]/mL (ref 0.35–5.50)

## 2021-05-30 MED ORDER — HYDROCORTISONE (PERIANAL) 2.5 % EX CREA: 1.0000 "application " | TOPICAL_CREAM | Freq: Every day | CUTANEOUS | 0 refills | Status: DC | PRN

## 2021-05-30 NOTE — Assessment & Plan Note (Signed)
Lab today.

## 2021-05-30 NOTE — Patient Instructions (Addendum)
Labs today   Chest xray today   Take one lasix today to see if it helps the shortness of breath   Use miralax Try not to strain with bowel movements   If suddenly worse-please go to the ER

## 2021-05-30 NOTE — Assessment & Plan Note (Signed)
More sob recently  Record reviewed  Under care of cardiology

## 2021-05-30 NOTE — Progress Notes (Signed)
Subjective:    Patient ID: [name], female    DOB: 17-Aug-1929, 88 y.o.   MRN: [id number]  This visit occurred during the SARS-CoV-2 public health emergency.  Safety protocols were in place, including screening questions prior to the visit, additional usage of staff PPE, and extensive cleaning of exam room while observing appropriate contact time as indicated for disinfecting solutions.   HPI 88 yo pt of Dr Darnell Level presents with breathing problems   Wt Readings from Last 3 Encounters:  04/12/21 139 lb 4 oz (63.2 kg)  04/04/21 139 lb (63 kg)  01/30/21 133 lb (60.3 kg)   Has had weight gain in the past several weeks  Wt today is 140 lb    6 weeks  Sometimes she has a hard time getting a good deep breath  More fatigued (quicker)  02 sats have been good  If long conversation she gets sob after about 4 minutes   Walking up and down hall gets quite sob  Nothing hurts   Occ a little cough (when she tries to clear her throat)    H/o CAD and Diastolic dysfunction and HTN and a fib and pulm HTN  Also CKD and iron def anemia   Records reviewed  In and out of a fib / cardioversion did not work   Has lasix on med list  Not taking it recently due to less fluid intake and also ankle swelling is less recently  She dislikes it  It makes her urinate   BP Readings from Last 3 Encounters:  05/30/21 132/76  04/12/21 120/64  04/04/21 134/73   Pulse Readings from Last 3 Encounters:  05/30/21 72  04/12/21 97  04/04/21 (!) 59    Lab Results  Component Value Date   CREATININE 0.97 01/30/2021   BUN 16 01/30/2021   NA 140 01/30/2021   K 4.2 01/30/2021   CL 104 01/30/2021   CO2 28 01/30/2021   Lab Results  Component Value Date   WBC 6.5 01/30/2021   HGB 13.5 01/30/2021   HCT 40.9 01/30/2021   MCV 98.9 01/30/2021   PLT 239.0 01/30/2021   Rls is worse this week  Has had iron infusions in the past   Due to see cardiology in 6-8 weeks   Hemorrhoids  Has a hemorrhoid that  pops out now and then No pain  No bleeding  Strains for bm occ   Constant fatigue   Patient Active Problem List   Diagnosis Date Noted   Malnutrition of moderate degree ([place]) 11/07/2020   Ascending cholangitis 11/07/2020   Choledocholithiasis 10/27/2020   Bacteremia due to Gram-negative bacteria 10/27/2020   Generalized weakness 10/25/2020   Frequent falls 10/25/2020   Nondisplaced fracture of proximal phalanx of right lesser toe(s), initial encounter for closed fracture 10/13/2020   COVID-19 virus infection 10/12/2020   History of osteomyelitis 10/10/2020   Pedal edema 09/22/2020   Skin cancer (melanoma) ([place]) 07/19/2020   IDA (iron deficiency anemia) 06/27/2020   MCI (mild cognitive impairment) with memory loss 12/28/2019   Chronic diastolic CHF (congestive heart failure) ([place]) 10/25/2019   DNR (do not resuscitate) 10/25/2019   Closed fracture of left distal radius and ulna 10/25/2019   Closed fracture of right wrist    Palliative care by specialist    CKD (chronic kidney disease) stage 3, GFR 30-59 ml/min ([place]) 10/24/2019   Ulcer of right foot ([place]) 08/14/2019   CAD (coronary artery disease), native coronary artery 07/13/2019   Dyspnea  on exertion    Pulmonary hypertension, unspecified (HCC)    Urge incontinence 05/06/2019   PAD (peripheral artery disease) ([place]) 03/12/2019   Hypertrophic toenail 01/27/2019   Cyanosis 01/12/2019   Hemorrhoid 11/25/2018   Vitamin B12 deficiency 08/04/2018   Vitamin D deficiency 02/25/2018   Peripheral neuropathy 12/04/2017   General unsteadiness 10/01/2017   Chronic constipation 10/01/2017   Esophageal dysphagia 04/08/2017   Chronic radicular pain of lower back 04/08/2017   Fall with injury 03/04/2017   Encounter for chronic pain management 11/11/2016   Breast mass, right 09/29/2016   Abdominal aortic atherosclerosis ([place]) 09/07/2015   Insomnia 06/08/2015   Health maintenance examination 12/08/2014   Advanced care  planning/counseling discussion 12/08/2014   Chronic leg pain 10/13/2014   Fatigue 10/28/2013   Persistent atrial fibrillation ([place]) 04/24/2013   Medicare annual wellness visit, subsequent 04/14/2012   Chest tightness 03/16/2012   RLS (restless legs syndrome) 02/07/2012   DDD (degenerative disc disease), lumbar 12/18/2009   MDD (major depressive disorder), recurrent episode ([place]) 12/10/2007   HLD (hyperlipidemia) 11/24/2006   PLMD (periodic limb movement disorder) 11/24/2006   Essential hypertension 11/24/2006   GERD 11/24/2006   GASTRIC ULCER 11/24/2006   Osteoarthritis 11/24/2006   Osteoporosis 11/24/2006   Past Medical History:  Diagnosis Date   Abdominal aortic atherosclerosis ([place]) 09/2015   by xray   Anxiety    BCC (basal cell carcinoma of skin) 05/2011   on nose   BCC (basal cell carcinoma of skin) 08/09/2020   upper chest, [facility] 12/27/20   Breast lesion    a. diagnosed with complex sclerosing lesion with calcifications of the right breast in 08/2016   Chest pain    a. nuclear stress test 03/2012: normal; b. 09/2018 MV: EF >65%, no isch/scar.    DDD (degenerative disc disease), lumbosacral 12/06/1992   Gallstones 09/2015   incidentally by xray   GERD (gastroesophageal reflux disease) 05/1999   HERPES ZOSTER 04/13/2007   Qualifier: Diagnosis of  By: [name], [name]    Hiatal hernia    History of shingles    ophthalmic, takes acyclovir daily   Hyperlipidemia 12/2003   Hypertension 1990   Mitral regurgitation    a. echo 03/2012: EF 60-65%, no RWMA, mild to mod AI/MR, mod TR, PASP 37 mmHg   Osteoarthritis 08/21/1989   Osteoporosis with fracture 11/1997   with compression fracture T12   Persistent atrial fibrillation ([place]) 03/16/2012   a. CHADS2VASc => 5 (HTN, age x 2, vascular disease, sex category)-->Eliquis 5 BID;  b. Recurrent AF in 06/2016;  c. 01/2017 s/p DCCV;  d. 02/2017 recurrent AFib->Amio added->DCCV; e. 03/2017 recurrent AFib, s/p reload of amio and  DCCV 04/02/2017   POSTHERPETIC NEURALGIA 04/20/2007   Qualifier: Diagnosis of  By: [name], Billie-Lynn Daniels    Rheumatoid arthritis ([place])    RLS (restless legs syndrome)    SCC (squamous cell carcinoma) 05/01/2021   right posterior flank inferior to axilla - well differentiated squamous cell carcinoma with superificial infiltration, base involved.   Past Surgical History:  Procedure Laterality Date   ABDOMINAL HYSTERECTOMY  Age 88 - 47   S/P TAH   abdominal ultrasound  04/23/2004   Gallstones   BREAST EXCISIONAL BIOPSY Right 2018   neg/benign complex sclerosing lesion   BREAST LUMPECTOMY WITH RADIOACTIVE SEED LOCALIZATION Right 10/2016   breast lumpectomy with radioactive seed localization and margin assessment for complex sclerosing lesion [facility])   BREAST LUMPECTOMY WITH RADIOACTIVE SEED LOCALIZATION Right 11/05/2016  Procedure: RIGHT BREAST LUMPECTOMY WITH RADIOACTIVE SEED LOCALIZATION;  Surgeon: Fanny Skates, MD;  Location: [place];  Service: General;  Laterality: Right;   CARDIAC CATHETERIZATION     CARDIOVERSION N/A 01/31/2017   Procedure: CARDIOVERSION;  Surgeon: Wellington Hampshire, MD;  Location: [facility];  Service: Cardiovascular;  Laterality: N/A;   CARDIOVERSION N/A 03/03/2017   Procedure: CARDIOVERSION;  Surgeon: Wellington Hampshire, MD;  Location: [facility];  Service: Cardiovascular;  Laterality: N/A;   CARDIOVERSION N/A 04/02/2017   Procedure: CARDIOVERSION;  Surgeon: Wellington Hampshire, MD;  Location: [facility];  Service: Cardiovascular;  Laterality: N/A;   CARDIOVERSION N/A 01/16/2018   Procedure: CARDIOVERSION;  Surgeon: Wellington Hampshire, MD;  Location: [facility];  Service: Cardiovascular;  Laterality: N/A;   CARDIOVERSION N/A 12/25/2020   Procedure: CARDIOVERSION;  Surgeon: Wellington Hampshire, MD;  Location: [facility];  Service: Cardiovascular;  Laterality: N/A;   CATARACT EXTRACTION  05/2010   left eye   CERVICAL DISCECTOMY  1992   Fusion (Dr.  Joya Salm)   ERCP N/A 10/30/2020   Procedure: ENDOSCOPIC RETROGRADE CHOLANGIOPANCREATOGRAPHY (ERCP);  Surgeon: Jackquline Denmark, MD;  Location[facility] ENDOSCOPY;  Service: Endoscopy;  Laterality: N/A;   ESI Bilateral 01/2016   S1 transforaminal ESI x3   ESOPHAGOGASTRODUODENOSCOPY  01/01/2002   with ulcer, bx. neg; + stricture gastric ulcer with hemorrhage   ESOPHAGOGASTRODUODENOSCOPY  04/23/2004   Esophageal stricture -- dilated   INTRAVASCULAR PRESSURE WIRE/FFR STUDY N/A 07/12/2019   Procedure: INTRAVASCULAR PRESSURE WIRE/FFR STUDY;  Surgeon: Wellington Hampshire, MD;  Location: [place] CV LAB;  Service: Cardiovascular;  Laterality: N/A;   LOWER EXTREMITY ANGIOGRAPHY Left 02/01/2019   LOWER EXTREMITY ANGIOGRAPHY;  Surgeon: Algernon Huxley, MD   LOWER EXTREMITY ANGIOGRAPHY Right 03/25/2019   Procedure: LOWER EXTREMITY ANGIOGRAPHY;  Surgeon: Algernon Huxley, MD;  Location: [place] CV LAB;  Service: Cardiovascular;  Laterality: Right;   LOWER EXTREMITY ANGIOGRAPHY Right 08/02/2019   Procedure: LOWER EXTREMITY ANGIOGRAPHY;  Surgeon: Algernon Huxley, MD;  Location: [place] CV LAB;  Service: Cardiovascular;  Laterality: Right;   nuclear stress test  03/2012   no ischemia   PERIPHERAL VASCULAR BALLOON ANGIOPLASTY Left 01/2019   Percutaneous transluminal angioplasty of left anterior and posterior tibial artery with 2.5 mm diameter by 22 cm length angioplasty balloon ([place])   REMOVAL OF STONES  10/30/2020   Procedure: REMOVAL OF STONES;  Surgeon: Jackquline Denmark, MD;  Location[facility] ENDOSCOPY;  Service: Endoscopy;;   RIGHT/LEFT HEART CATH AND CORONARY ANGIOGRAPHY Bilateral 07/12/2019   Procedure: RIGHT/LEFT HEART CATH AND CORONARY ANGIOGRAPHY;  Surgeon: Wellington Hampshire, MD;  Location: [place] CV LAB;  Service: Cardiovascular;  Laterality: Bilateral;   SKIN CANCER EXCISION  05/20/2011   BCC from nose   SPHINCTEROTOMY  10/30/2020   Procedure: SPHINCTEROTOMY;  Surgeon: Jackquline Denmark, MD;  Location: [facility]  ENDOSCOPY;  Service: Endoscopy;;   US ECHOCARDIOGRAPHY  03/2012   in flutter, EF 60%, mild-mod aortic, mitral, tricuspid regurg, mildly dilated LA   Social History   Tobacco Use   Smoking status: Former    Packs/day: 0.25    Years: 1.00    Pack years: 0.25    Types: Cigarettes    Quit date: 04/08/1976    Years since quitting: 45.1   Smokeless tobacco: Never   Tobacco comments:    pt smokes occasionally "socially"  Vaping Use   Vaping Use: Never used  Substance Use Topics   Alcohol use: No    Alcohol/week:  1.0 standard drink    Types: 1 Glasses of wine per week   Drug use: No   Family History  Problem Relation Age of Onset   Emphysema Mother    Heart disease Father        MI   Stroke Father        first at age 21.   Stroke Sister    Diabetes Sister    Hypertension Brother    Heart disease Brother        Heart Valve   Stroke Brother    Diabetes Brother    Cancer Brother        esophageal   Diabetes Brother    Diabetes Brother    Colon cancer Neg Hx    Allergies  Allergen Reactions   Penicillins Rash and Other (See Comments)    Childhood Allergy Has patient had a PCN reaction causing immediate rash, facial/tongue/throat swelling, SOB or lightheadedness with hypotension: Yes Has patient had a PCN reaction causing severe rash involving mucus membranes or skin necrosis: Unknown Has patient had a PCN reaction that required hospitalization: No Has patient had a PCN reaction occurring within the last 10 years: No If all of the above answers are "NO", then may proceed with Cephalosporin use.    Current Outpatient Medications on File Prior to Visit  Medication Sig Dispense Refill   acetaminophen (TYLENOL) 500 MG tablet Take 500 mg by mouth every 6 (six) hours as needed for mild pain.     acyclovir (ZOVIRAX) 400 MG tablet Take 400 mg by mouth 2 (two) times daily.     Alpha-Lipoic Acid 600 MG TABS Take 600 mg by mouth daily.     amLODipine (NORVASC) 2.5 MG tablet Take 1  tablet (2.5 mg total) by mouth daily. 90 tablet 1   apixaban (ELIQUIS) 2.5 MG TABS tablet TAKE 1 TABLET BY MOUTH TWICE DAILY 180 tablet 1   atorvastatin (LIPITOR) 20 MG tablet TAKE 1 TABLET BY MOUTH ONCE DAILY 90 tablet 3   Docusate Calcium (STOOL SOFTENER PO) Take 1 capsule by mouth daily.     DULoxetine (CYMBALTA) 60 MG capsule TAKE 1 CAPSULE BY MOUTH ONCE DAILY 90 capsule 1   gabapentin (NEURONTIN) 400 MG capsule TAKE 1 CAPSULE BY MOUTH AT BEDTIME AND SECOND DOSE DURING THE DAY AS NEEDED 150 capsule 1   lisinopril (ZESTRIL) 20 MG tablet TAKE 1 TABLET BY MOUTH ONCE DAILY 30 tablet 1   memantine (NAMENDA) 5 MG tablet TAKE 1 TABLET BY MOUTH TWICE DAILY 60 tablet 6   metoprolol succinate (TOPROL-XL) 50 MG 24 hr tablet Take 2 tablets (100 mg) in the morning and 1 tablet (50 mg) in the evening 270 tablet 1   pantoprazole (PROTONIX) 40 MG tablet TAKE 1 TABLET BY MOUTH ONCE DAILY 90 tablet 1   polyethylene glycol (MIRALAX / GLYCOLAX) 17 g packet Take 17 g by mouth daily as needed for mild constipation. 14 each 0   prednisoLONE acetate (PRED FORTE) 1 % ophthalmic suspension Place 1 drop into the left eye daily.      rOPINIRole (REQUIP) 1 MG tablet TAKE 2 AND 1/2 TABLETS BY MOUTH AT BEDTIME 225 tablet 0   tiZANidine (ZANAFLEX) 2 MG tablet Take 2 mg by mouth daily.     traMADol (ULTRAM) 50 MG tablet Take 50-100 mg by mouth at bedtime as needed.     Vitamin D, Ergocalciferol, (DRISDOL) 1.25 MG (50000 UNIT) CAPS capsule TAKE 1 CAPSULE BY MOUTH ONCE WEEKLY (EVERY 7  DAYS) 12 capsule 1   furosemide (LASIX) 20 MG tablet Take 1 tablet (20 mg total) by mouth as needed. (Patient not taking: Reported on 04/12/2021) 90 tablet 3   No current facility-administered medications on file prior to visit.     Review of Systems  Constitutional:  Positive for fatigue. Negative for activity change, appetite change, fever and unexpected weight change.  HENT:  Negative for congestion, ear pain, rhinorrhea, sinus pressure and  sore throat.   Eyes:  Negative for pain, redness and visual disturbance.  Respiratory:  Positive for shortness of breath. Negative for cough, chest tightness, wheezing and stridor.   Cardiovascular:  Negative for chest pain, palpitations and leg swelling.  Gastrointestinal:  Negative for abdominal pain, blood in stool, constipation and diarrhea.  Endocrine: Negative for polydipsia and polyuria.  Genitourinary:  Negative for dysuria, frequency and urgency.  Musculoskeletal:  Negative for arthralgias, back pain and myalgias.  Skin:  Negative for pallor and rash.  Allergic/Immunologic: Negative for environmental allergies.  Neurological:  Negative for dizziness, syncope and headaches.       Worse RLS  Hematological:  Negative for adenopathy. Does not bruise/bleed easily.  Psychiatric/Behavioral:  Negative for decreased concentration and dysphoric mood. The patient is not nervous/anxious.       Objective:   Physical Exam Constitutional:      General: She is not in acute distress.    Appearance: Normal appearance. She is well-developed and normal weight. She is not ill-appearing or diaphoretic.     Comments: Frail appearing Wheelchair   HENT:     Head: Normocephalic and atraumatic.  Eyes:     Conjunctiva/sclera: Conjunctivae normal.     Pupils: Pupils are equal, round, and reactive to light.  Neck:     Thyroid: No thyromegaly.     Vascular: No carotid bruit or JVD.  Cardiovascular:     Rate and Rhythm: Normal rate. Rhythm irregular.     Heart sounds: Normal heart sounds.    No gallop.  Pulmonary:     Effort: Pulmonary effort is normal. No respiratory distress.     Breath sounds: Normal breath sounds. No stridor. No wheezing, rhonchi or rales.     Comments: Bs are distant  No crackles  Abdominal:     General: Bowel sounds are normal. There is no distension or abdominal bruit.     Palpations: Abdomen is soft. There is no mass.     Tenderness: There is no abdominal tenderness.   Musculoskeletal:     Cervical back: Normal range of motion and neck supple.     Right lower leg: Edema present.     Left lower leg: Edema (trace pedal edema) present.  Lymphadenopathy:     Cervical: No cervical adenopathy.  Skin:    General: Skin is warm and dry.     Coloration: Skin is not pale.     Findings: No rash.  Neurological:     Mental Status: She is alert.     Cranial Nerves: No cranial nerve deficit.     Coordination: Coordination normal.     Deep Tendon Reflexes: Reflexes are normal and symmetric. Reflexes normal.  Psychiatric:        Mood and Affect: Mood is anxious.          Assessment & Plan:   Problem List Items Addressed This Visit       Cardiovascular and Mediastinum   Chronic diastolic CHF (congestive heart failure) ([place])    Pt is new to  me  Unsure if this is causing her sob  Records reviewed cxr and bnp ordered  inst to take a lasix today Will likely need early f/u with cardiology      Relevant Orders   Brain natriuretic peptide   Essential hypertension    bp is not elevated today      Hemorrhoid    Symptoms consistent with  anusol hc px for topical use  F/u if not improved  Encouraged miralax to reduce need to strain       Persistent atrial fibrillation ([place])    Noted on exam       Pulmonary hypertension, unspecified ([place])    More sob recently  Record reviewed  Under care of cardiology         Digestive   Chronic constipation     Genitourinary   CKD (chronic kidney disease) stage 3, GFR 30-59 ml/min (HCC)    Lab today      Relevant Orders   Comprehensive metabolic panel     Other   Dyspnea on exertion - Primary    Much worse lately  She has not alerted her cardiologist New to me-h/o CAD and CHF and pulm HTN and afib  Also iron def (recent RLS worse) Reassuring exam  Lab and CXR ordered         Relevant Orders   Brain natriuretic peptide   DG Chest 2 View (Completed)   Fatigue    More fatigued Sob with  exertion  New to me /record reviewed  Lab ordered (also RLS worse-? If iron is down)        Relevant Orders   CBC with Differential/Platelet   Comprehensive metabolic panel   TSH   IDA (iron deficiency anemia)    Cbc and iron drawn No iron inf in a while  RLS worse lately  Very tired Sob on exertion       Relevant Orders   CBC with Differential/Platelet   Iron   RLS (restless legs syndrome)    Worse lately  Cbc with iron ordered      Vitamin B12 deficiency    More tired B12 ordered       Relevant Orders   Vitamin B12

## 2021-05-30 NOTE — Assessment & Plan Note (Signed)
Much worse lately  She has not alerted her cardiologist New to me-h/o CAD and CHF and pulm HTN and afib  Also iron def (recent RLS worse) Reassuring exam  Lab and CXR ordered

## 2021-05-30 NOTE — Assessment & Plan Note (Addendum)
Symptoms consistent with  anusol hc px for topical use  F/u if not improved  Encouraged miralax to reduce need to strain

## 2021-05-30 NOTE — Assessment & Plan Note (Signed)
Cbc and iron drawn No iron inf in a while  RLS worse lately  Very tired Sob on exertion

## 2021-05-30 NOTE — Assessment & Plan Note (Signed)
Pt is new to me  Unsure if this is causing her sob  Records reviewed cxr and bnp ordered  inst to take a lasix today Will likely need early f/u with cardiology

## 2021-05-30 NOTE — Assessment & Plan Note (Signed)
Noted on exam

## 2021-05-30 NOTE — Assessment & Plan Note (Signed)
More fatigued Sob with exertion  New to me /record reviewed  Lab ordered (also RLS worse-? If iron is down)

## 2021-05-30 NOTE — Assessment & Plan Note (Signed)
bp is not elevated today

## 2021-05-30 NOTE — Assessment & Plan Note (Signed)
Worse lately  Cbc with iron ordered

## 2021-05-30 NOTE — Assessment & Plan Note (Signed)
More tired B12 ordered

## 2021-05-31 ENCOUNTER — Telehealth: Payer: Self-pay

## 2021-05-31 ENCOUNTER — Telehealth: Payer: Self-pay | Admitting: *Deleted

## 2021-05-31 MED ORDER — FUROSEMIDE 20 MG PO TABS: 20.0000 mg | ORAL_TABLET | Freq: Every day | ORAL | 3 refills | Status: DC

## 2021-05-31 NOTE — Telephone Encounter (Signed)
-----   Message from Wellington Hampshire, MD sent at 05/31/2021 11:49 AM EST ----- She does have chronic diastolic heart failure but last time when I saw her, she reported that she was not taking furosemide no regular basis due to poor oral intake.  [name],  Please instruct the patient to take furosemide 20 mg once daily to see if her shortness of breath will improve.  She has a follow-up appointment with me in April.

## 2021-05-31 NOTE — Telephone Encounter (Signed)
DPR on file. Spoke with the patients daughter [name]. [name] that Dr. Fletcher Anon has been update by the patients pcp regarding recent lab results and the patients complaint of increased sob. [name] made aware of Dr. Tyrell Antonio recommendation to change lasix to 20 mg daily instead of prn. Patient to f/u as planned in April. [name] if symptoms do not improved with daily lasix she should call to provide Dr. Fletcher Anon an update. [name] verbalized understanding and voiced appreciation for the call.

## 2021-05-31 NOTE — Telephone Encounter (Signed)
11:54a Received a communication that patient's daughter [name] was contacted by an Art gallery manager and daughter requested that a nurse call to schedule a visit.  I called and spoke with [name] who states that they had to cancel their last visit that was scheduled with her palliative care provider [facility] and she requests that patient get back in rotation. She was being seen every 3 months. She states that she notices a decline in patient and knows she will need [place] more in the future. Advised that I would get with Latoya to see what the best day/time will be for her to visit. Daughter requests call back next week.

## 2021-06-05 ENCOUNTER — Other Ambulatory Visit: Payer: Self-pay | Admitting: Cardiovascular Disease

## 2021-06-05 ENCOUNTER — Other Ambulatory Visit: Payer: Self-pay | Admitting: Family Medicine

## 2021-06-06 ENCOUNTER — Other Ambulatory Visit: Payer: Self-pay

## 2021-06-06 ENCOUNTER — Encounter: Payer: Self-pay | Admitting: Dermatology

## 2021-06-06 ENCOUNTER — Ambulatory Visit: Payer: Medicare Other | Admitting: Dermatology

## 2021-06-06 ENCOUNTER — Telehealth: Payer: Self-pay

## 2021-06-06 DIAGNOSIS — L821 Other seborrheic keratosis: Secondary | ICD-10-CM | POA: Diagnosis not present

## 2021-06-06 DIAGNOSIS — L570 Actinic keratosis: Secondary | ICD-10-CM

## 2021-06-06 DIAGNOSIS — C44529 Squamous cell carcinoma of skin of other part of trunk: Secondary | ICD-10-CM | POA: Diagnosis not present

## 2021-06-06 DIAGNOSIS — C4492 Squamous cell carcinoma of skin, unspecified: Secondary | ICD-10-CM

## 2021-06-06 NOTE — Patient Instructions (Signed)
Electrodesiccation and Curettage (Scrape and Burn) Wound Care Instructions  Leave the original bandage on for 24 hours if possible.  If the bandage becomes soaked or soiled before that time, it is OK to remove it and examine the wound.  A small amount of post-operative bleeding is normal.  If excessive bleeding occurs, remove the bandage, place gauze over the site and apply continuous pressure (no peeking) over the area for 30 minutes. If this does not work, please call our clinic as soon as possible or page your doctor if it is after hours.   Once a day, cleanse the wound with soap and water. It is fine to shower. If a thick crust develops you may use a Q-tip dipped into dilute hydrogen peroxide (mix 1:1 with water) to dissolve it.  Hydrogen peroxide can slow the healing process, so use it only as needed.    After washing, apply petroleum jelly (Vaseline) or an antibiotic ointment if your doctor prescribed one for you, followed by a bandage.    For best healing, the wound should be covered with a layer of ointment at all times. If you are not able to keep the area covered with a bandage to hold the ointment in place, this may mean re-applying the ointment several times a day.  Continue this wound care until the wound has healed and is no longer open. It may take several weeks for the wound to heal and close.  Itching and mild discomfort is normal during the healing process.  If you have any discomfort, you can take Tylenol (acetaminophen) or ibuprofen as directed on the bottle. (Please do not take these if you have an allergy to them or cannot take them for another reason).  Some redness, tenderness and white or yellow material in the wound is normal healing.  If the area becomes very sore and red, or develops a thick yellow-green material (pus), it may be infected; please notify us.    Wound healing continues for up to one year following surgery. It is not unusual to experience pain in the scar  from time to time during the interval.  If the pain becomes severe or the scar thickens, you should notify the office.    A slight amount of redness in a scar is expected for the first six months.  After six months, the redness will fade and the scar will soften and fade.  The color difference becomes less noticeable with time.  If there are any problems, return for a post-op surgery check at your earliest convenience.  To improve the appearance of the scar, you can use silicone scar gel, cream, or sheets (such as Mederma or Serica) every night for up to one year. These are available over the counter (without a prescription).  Please call our office at [phone] for any questions or concerns.   Actinic keratoses are precancerous spots that appear secondary to cumulative UV radiation exposure/sun exposure over time. They are chronic with expected duration over 1 year. A portion of actinic keratoses will progress to squamous cell carcinoma of the skin. It is not possible to reliably predict which spots will progress to skin cancer and so treatment is recommended to prevent development of skin cancer.  Recommend daily broad spectrum sunscreen SPF 30+ to sun-exposed areas, reapply every 2 hours as needed.  Recommend staying in the shade or wearing long sleeves, sun glasses (UVA+UVB protection) and wide brim hats (4-inch brim around the entire circumference of the hat). Call  for new or changing lesions.   Cryotherapy Aftercare  Wash gently with soap and water everyday.   Apply Vaseline and Band-Aid daily until healed.   If You Need Anything After Your Visit  If you have any questions or concerns for your doctor, please call our main line at [phone] and press option 4 to reach your doctor's medical assistant. If no one answers, please leave a voicemail as directed and we will return your call as soon as possible. Messages left after 4 pm will be answered the following business day.   You  may also send [place] a message via [place]. We typically respond to MyChart messages within 1-2 business days.  For prescription refills, please ask your pharmacy to contact our office. Our fax number is [phone].  If you have an urgent issue when the clinic is closed that cannot wait until the next business day, you can page your doctor at the number below.    Please note that while we do our best to be available for urgent issues outside of office hours, we are not available 24/7.   If you have an urgent issue and are unable to reach [place], you may choose to seek medical care at your doctor's office, retail clinic, urgent care center, or emergency room.  If you have a medical emergency, please immediately call 911 or go to the emergency department.  Pager Numbers  - Dr. Nehemiah Massed: [phone]  - Dr. Laurence Ferrari: [phone]  - Dr. Nicole Kindred: [phone]  In the event of inclement weather, please call our main line at [phone] for an update on the status of any delays or closures.  Dermatology Medication Tips: Please keep the boxes that topical medications come in in order to help keep track of the instructions about where and how to use these. Pharmacies typically print the medication instructions only on the boxes and not directly on the medication tubes.   If your medication is too expensive, please contact our office at [phone] option 4 or send [place] a message through [place].   We are unable to tell what your co-pay for medications will be in advance as this is different depending on your insurance coverage. However, we may be able to find a substitute medication at lower cost or fill out paperwork to get insurance to cover a needed medication.   If a prior authorization is required to get your medication covered by your insurance company, please allow [place] 1-2 business days to complete this process.  Drug prices often vary depending on where the prescription is filled and some  pharmacies may offer cheaper prices.  The website www.goodrx.com contains coupons for medications through different pharmacies. The prices here do not account for what the cost may be with help from insurance (it may be cheaper with your insurance), but the website can give you the price if you did not use any insurance.  - You can print the associated coupon and take it with your prescription to the pharmacy.  - You may also stop by our office during regular business hours and pick up a GoodRx coupon card.  - If you need your prescription sent electronically to a different pharmacy, notify our office through [facility] or by phone at [phone] option 4.     Si Usted Necesita Algo Despus de Su Visita  Tambin puede enviarnos un mensaje a travs de Pharmacist, community. Por lo general respondemos a los mensajes de MyChart en el transcurso de 1 a 2 das  hbiles.  Para renovar recetas, por favor pida a su farmacia que se ponga en contacto con nuestra oficina. Harland Dingwall de fax es [place] [phone].  Si tiene un asunto urgente cuando la clnica est cerrada y que no puede esperar hasta el siguiente da hbil, puede llamar/localizar a su doctor(a) al nmero que aparece a continuacin.   Por favor, tenga en cuenta que aunque hacemos todo lo posible para estar disponibles para asuntos urgentes fuera del horario de [place], no estamos disponibles las 24 horas del da, los 7 das de la [place].   Si tiene un problema urgente y no puede comunicarse con nosotros, puede optar por buscar atencin mdica  en el consultorio de su doctor(a), en una clnica privada, en un centro de atencin urgente o en una sala de emergencias.  Si tiene Engineering geologist, por favor llame inmediatamente al 911 o vaya a la sala de emergencias.  Nmeros de bper  - Dr. Nehemiah Massed: [phone]  - Dra. Moye: [phone]  - Dra. Nicole Kindred: [phone]  En caso de inclemencias del [place], por favor llame a Johnsie Kindred  principal al [phone] para una actualizacin sobre el [place] de cualquier retraso o cierre.  Consejos para la medicacin en dermatologa: Por favor, guarde las cajas en las que vienen los medicamentos de uso tpico para ayudarle a seguir las instrucciones sobre dnde y cmo usarlos. Las farmacias generalmente imprimen las instrucciones del medicamento slo en las cajas y no directamente en los tubos del [place].   Si su medicamento es muy caro, por favor, pngase en contacto con Zigmund Daniel llamando al [phone] y presione la opcin 4 o envenos un mensaje a travs de Pharmacist, community.   No podemos decirle cul ser su copago por los medicamentos por adelantado ya que esto es diferente dependiendo de la cobertura de su seguro. Sin embargo, es posible que podamos encontrar un medicamento sustituto a Electrical engineer un formulario para que el seguro cubra el medicamento que se considera necesario.   Si se requiere una autorizacin previa para que su compaa de seguros [place] su medicamento, por favor permtanos de 1 a 2 das hbiles para completar este proceso.  Los precios de los medicamentos varan con frecuencia dependiendo del Environmental consultant de dnde se surte la receta y alguna farmacias pueden ofrecer precios ms baratos.  El sitio web www.goodrx.com tiene cupones para medicamentos de Airline pilot. Los precios aqu no tienen en cuenta lo que podra costar con la ayuda del seguro (puede ser ms barato con su seguro), pero el sitio web puede darle el precio si no utiliz Research scientist (physical sciences).  - Puede imprimir el cupn correspondiente y llevarlo con su receta a la farmacia.  - Tambin puede pasar por nuestra oficina durante el horario de atencin regular y Charity fundraiser una tarjeta de cupones de GoodRx.  - Si necesita que su receta se enve electrnicamente a una farmacia diferente, informe a nuestra oficina a travs de MyChart de [place] o por telfono llamando al [phone] y presione la opcin  4.

## 2021-06-06 NOTE — Progress Notes (Signed)
? ? ?Chronic Care Management ?Pharmacy Assistant  ? ?Name: [name]  MRN: [id number] DOB: 06-18-1929 ? ?Reason for Encounter: CCM (Initial Questions) ?  ?Recent office visits:  ?02/21/2021 - Ria Bush, MD - Message - Due to blood pressure running high - Start Amlodipine 5 mg daily.  ?02/20/2021 Ria Bush, MD - Message - Due to bloodshot eye (subconjunctival hemorrhage) increase Metoprolol XL to 75 mg twice daily.  ?02/07/2021 Ria Bush, MD - Telephone - Take Lasix for next 2-3 days due to leg swelling and redness.  ?01/30/2021 - Ria Bush, MD - Patient presented for hypertension. Labs: Renal Function, C-Reactive Protein, CBC and Sedimentation Rate: ""blood counts and inflammatory markers returned to normal and kidney function remains mildly impaired but overall very stable". Change: rOPINIRole (REQUIP) 1 MG tablet - take 2.5 oral at bedtime.   ?01/23/2021 Dalia Heading, CMA - Clinical Support - Patient presented for denosumab (PROLIA) injection 60 mg.  ?01/23/2021 - Ria Bush, MD - Telephone - Increase Gabapentin - add 1 tab in afternoon due to leg swelling. Take 1 tab in afternoon and 1 tab at night.  ? ?Recent consult visits:  ?05/31/2021 - [name], MD - Cardiology - Telephone Results - Patient has chronic diastolic heart failure. Start: Furosemide 20 mg once daily to improve shortness of breath.  ?05/30/2021 - Loura Pardon, MD - Family Medicine - Patient presented for dyspnea. Labs: Brain natriuretic peptide, CMP, Vitamin B12, CBC, Iron, TSH and DG Chest 2 view. Results: "Labs are overall stable including thyroid and blood count and iron level and chemistries. Iron level is good  ?The BNP (test for heart failure) is very mildly elevated (unsure if significant). Chest xray was reassuring". Start: hydrocortisone (ANUSOL-HC) 2.5 % rectal cream due to hemorrhoid.  ?05/01/2021 - Boneta Lucks, DPM - Podiatry - Patient presented for peripheral vascular disease.  Procedure: Debridement of mycotic and hypertrophic toenails, 1 through 5 bilateral and clearing of subungual debris. ?04/26/2021 - Alfonso Patten, MD - Dermatology - Patient presented for actinic keratosis. Patient had lesions removed from right dorsal hand x2, right forearms x1, left upper back x2 by cryotherapy. Lesions sent to pathology. Results: Skin , right posterior flank inferior to axilla ?WELL DIFFERENTIATED SQUAMOUS CELL CARCINOMA WITH SUPERFICIAL INFILTRATION, BASE INVOLVED. ?04/12/2021 - [name], MD - Cardiology - Patient presented for permanent atrial fibrillation. Ordered: EKG - demonstrates atrial fibrillation with poor R wave progression in the anterior leads.  Ventricular rate is 97 bpm. -Change: amLODipine (NORVASC) 2.5 MG tablet vs. 5 mg. -Change: metoprolol succinate (TOPROL-XL) 50 MG 24 hr tablet vs. 75 mg. -Stop due to completed course: Cyanocobalamin (B-12 COMPLIANCE INJECTION) 1000 MCG/ML KIT. -Stop due to completed course: mupirocin ointment (BACTROBAN) 2 %.  ?04/04/2021 - Eulogio Ditch, NP - Vascular Surgery - Patient presented for peripheral artery disease. No medication changes.  ?04/04/2021 - [facility] - Performed: VAS [place] ABI with/wo TBI ?01/23/2021 - Boneta Lucks, DPM - Podiatry - Patient presented for peripheral vascular disease. Procedure: Debridement of mycotic and hypertrophic toenails, 1 through 5 bilateral and clearing of subungual debris. ?01/12/2021 - Marrianne Mood, PA - Cardiology - Patient presented for atrial flutter. Ordered: EKG. --Discontinue amiodarone --Discontinue amlodipine --Increase to Toprol 46m BID due to persistent atrial flutter / atrial fibrillation. -Stop due to patient not taking silver sulfADIAZINE (SILVADENE) 1 % cream. ?12/27/2020 - VAlfonso Patten MD - Dermatology - Patient presented for basal cell carcinoma. Patient had lesion on upper chest removed.  ?12/22/2020 -  Marrianne Mood, PA - Cardiology - Message - Change:  metoprolol succinate (TOPROL-XL) 25 MG 24 hr tablet - 3 times daily vs. 2 times daily due to upcoming Cardioversion.  ?12/15/2020 - Marrianne Mood, PA - Cardiology - Patient presented for atrial flutter. Ordered: EKG shows 2:1 atrial flutter with RVR. Start: furosemide (LASIX) 20 MG tablet - 1 tablet daily. Stop due to completed course: vitamin B-12 (CYANOCOBALAMIN) 1000 MCG tablet. Start: Cyanocobalamin (B-12 COMPLIANCE INJECTION) 1000 MCG/ML KIT - inject every 30 days.  ? ?Hospital visits:  ?12/25/2020 [facility] - Patient presented for Cardioversion.  ?Change: amiodarone (PACERONE) 200 MG tablet 1 tab by mouth daily.  ?Stop: Aspirin EC 81 mg.  ?Discharged same day. ? ?Medications: ?Outpatient Encounter Medications as of 06/06/2021  ?Medication Sig  ? acetaminophen (TYLENOL) 500 MG tablet Take 500 mg by mouth every 6 (six) hours as needed for mild pain.  ? acyclovir (ZOVIRAX) 400 MG tablet Take 400 mg by mouth 2 (two) times daily.  ? Alpha-Lipoic Acid 600 MG TABS Take 600 mg by mouth daily.  ? amLODipine (NORVASC) 2.5 MG tablet Take 1 tablet (2.5 mg total) by mouth daily.  ? apixaban (ELIQUIS) 2.5 MG TABS tablet TAKE 1 TABLET BY MOUTH TWICE DAILY  ? atorvastatin (LIPITOR) 20 MG tablet TAKE 1 TABLET BY MOUTH ONCE DAILY  ? Docusate Calcium (STOOL SOFTENER PO) Take 1 capsule by mouth daily.  ? DULoxetine (CYMBALTA) 60 MG capsule TAKE 1 CAPSULE BY MOUTH ONCE DAILY  ? furosemide (LASIX) 20 MG tablet Take 1 tablet (20 mg total) by mouth daily.  ? gabapentin (NEURONTIN) 400 MG capsule TAKE 1 CAPSULE BY MOUTH AT BEDTIME AND SECOND DOSE DURING THE DAY AS NEEDED  ? hydrocortisone (ANUSOL-HC) 2.5 % rectal cream Place 1 application rectally daily as needed for hemorrhoids or anal itching. For hemmorhoid  ? lisinopril (ZESTRIL) 20 MG tablet TAKE 1 TABLET BY MOUTH ONCE DAILY  ? memantine (NAMENDA) 5 MG tablet TAKE 1 TABLET BY MOUTH TWICE DAILY  ? metoprolol succinate (TOPROL-XL) 50 MG 24 hr tablet Take 2 tablets (100  mg) in the morning and 1 tablet (50 mg) in the evening  ? pantoprazole (PROTONIX) 40 MG tablet TAKE 1 TABLET BY MOUTH ONCE DAILY  ? polyethylene glycol (MIRALAX / GLYCOLAX) 17 g packet Take 17 g by mouth daily as needed for mild constipation.  ? prednisoLONE acetate (PRED FORTE) 1 % ophthalmic suspension Place 1 drop into the left eye daily.   ? rOPINIRole (REQUIP) 1 MG tablet TAKE 2 AND 1/2 TABLETS BY MOUTH AT BEDTIME  ? tiZANidine (ZANAFLEX) 2 MG tablet Take 2 mg by mouth daily.  ? traMADol (ULTRAM) 50 MG tablet Take 50-100 mg by mouth at bedtime as needed.  ? Vitamin D, Ergocalciferol, (DRISDOL) 1.25 MG (50000 UNIT) CAPS capsule TAKE 1 CAPSULE BY MOUTH ONCE WEEKLY (EVERY 7 DAYS)  ? ?No facility-administered encounter medications on file as of 06/06/2021.  ? ?Lab Results  ?Component Value Date/Time  ? MICROALBUR 1.3 06/15/2020 10:25 AM  ? MICROALBUR 1.6 04/28/2019 01:41 PM  ?  ?BP Readings from Last 3 Encounters:  ?05/30/21 132/76  ?04/12/21 120/64  ?04/04/21 134/73  ? ?Unsuccessful attempt to reach patient. Left patient message to have all medications, supplements and any blood glucose and blood pressure readings available for review at appointment.  03/02 1:30 ? ?Star Rating Drugs:  ?Medication:  Last Fill: Day Supply ?Lisinopril 20 mg 05/15/2021 30 ?Atorvastatin 20 mg 05/28/2021 100  ? ?Care Gaps: ?Annual wellness  visit in last year? Yes 06/21/2020 ?Most Recent BP reading: 132/76 on 05/30/2021 ? ?Charlene Brooke, CPP notified ? ?Marijean Niemann, RMA ?Clinical Pharmacy Assistant ?[phone] ? ?Time Spent: 60 Minutes ? ? ? ? ?

## 2021-06-06 NOTE — Progress Notes (Signed)
? ?Follow-Up Visit ?  ?Subjective  ?[name] is a 88 y.o. female who presents for the following: Follow-up (Patient here today for follow up of aks, ED&C treatment of bcc at right posterior flank inferior to axilla. Patient also reports area under right eye that feels a different. ). ? ?The following portions of the chart were reviewed this encounter and updated as appropriate:  Tobacco  Allergies  Meds  Problems  Med Hx  Surg Hx  Fam Hx   ?  ? ?Review of Systems: No other skin or systemic complaints except as noted in HPI or Assessment and Plan. ? ? ?Objective  ?Well appearing patient in no apparent distress; mood and affect are within normal limits. ? ?A focused examination was performed including right lower eyelid, right flank, bilateral hands, arms face . Relevant physical exam findings are noted in the Assessment and Plan. ? ?right posterior flank inferior to axilla ?Pink papule ? ?Right Dorsal Hand x 2 (2) ?Erythematous thin papules/macules with thick scale ? ?Right Lower Eyelid ?Erythematous stuck-on, waxy papule slightly adjacent depression without features suspicious for malignancy on dermoscopy ? ? ? ?Assessment & Plan  ?SCC (squamous cell carcinoma) ?right posterior flank inferior to axilla ? ?Destruction of lesion ? ?Destruction method: electrodesiccation and curettage   ?Informed consent: discussed and consent obtained   ?Timeout:  patient name, date of birth, surgical site, and procedure verified ?Patient was prepped and draped in usual sterile fashion: area prepped with isopropyl alcohol. ?Anesthesia: the lesion was anesthetized in a standard fashion   ?Anesthetic:  1% lidocaine w/ epinephrine 1-100,000 buffered w/ 8.4% NaHCO3 ?Curettage performed in three different directions: Yes   ?Electrodesiccation performed over the curetted area: Yes   ?Curettage cycles:  3 ?Final wound size (cm):  1.1 ?Hemostasis achieved with:  electrodesiccation ?Outcome: patient tolerated procedure well with  no complications   ?Post-procedure details: wound care instructions given   ?Additional details:  Mupirocin and a pressure dressing applied ? ?WELL DIFFERENTIATED SQUAMOUS CELL CARCINOMA WITH SUPERFICIAL INFILTRATION, BASE INVOLVED  ? ?Patient preferred ED&C ? ? ? ? ? ?Actinic keratosis (2) ?Right Dorsal Hand x 2 ? ?Hypertrophic AKs at right dorsal hands ? ?Actinic keratoses are precancerous spots that appear secondary to cumulative UV radiation exposure/sun exposure over time. They are chronic with expected duration over 1 year. A portion of actinic keratoses will progress to squamous cell carcinoma of the skin. It is not possible to reliably predict which spots will progress to skin cancer and so treatment is recommended to prevent development of skin cancer. ? ?Recommend daily broad spectrum sunscreen SPF 30+ to sun-exposed areas, reapply every 2 hours as needed.  ?Recommend staying in the shade or wearing long sleeves, sun glasses (UVA+UVB protection) and wide brim hats (4-inch brim around the entire circumference of the hat). ?Call for new or changing lesions. ? ?Destruction of lesion - Right Dorsal Hand x 2 ? ?Destruction method: cryotherapy   ?Informed consent: discussed and consent obtained   ?Lesion destroyed using liquid nitrogen: Yes   ?Cryotherapy cycles:  2 ?Outcome: patient tolerated procedure well with no complications   ?Post-procedure details: wound care instructions given   ?Additional details:  Prior to procedure, discussed risks of blister formation, small wound, skin dyspigmentation, or rare scar following cryotherapy. Recommend Vaseline ointment to treated areas while healing. ? ? ?Seborrheic keratosis ?Right Lower Eyelid ? ?With adjacent focal slight depression without suspicious features on dermoscopy ? ?Will recheck at next 6 month follow up ? ?  Patient advised if notices anything changing to call us ? ? ?Return for 3 month follow up recheck ED&C and aks, 6 month tbse h/o bcc and aks . ?I,  Ruthell Rummage, CMA, am acting as scribe for Forest Gleason, MD. ? ?Documentation: I have reviewed the above documentation for accuracy and completeness, and I agree with the above. ? ?Forest Gleason, MD ? ?

## 2021-06-07 ENCOUNTER — Ambulatory Visit (INDEPENDENT_AMBULATORY_CARE_PROVIDER_SITE_OTHER): Payer: Medicare Other | Admitting: Pharmacist

## 2021-06-07 ENCOUNTER — Other Ambulatory Visit: Payer: Self-pay | Admitting: Cardiovascular Disease

## 2021-06-07 DIAGNOSIS — I4819 Other persistent atrial fibrillation: Secondary | ICD-10-CM

## 2021-06-07 DIAGNOSIS — M810 Age-related osteoporosis without current pathological fracture: Secondary | ICD-10-CM

## 2021-06-07 DIAGNOSIS — G2581 Restless legs syndrome: Secondary | ICD-10-CM

## 2021-06-07 DIAGNOSIS — F331 Major depressive disorder, recurrent, moderate: Secondary | ICD-10-CM

## 2021-06-07 DIAGNOSIS — I10 Essential (primary) hypertension: Secondary | ICD-10-CM

## 2021-06-07 DIAGNOSIS — K219 Gastro-esophageal reflux disease without esophagitis: Secondary | ICD-10-CM

## 2021-06-07 DIAGNOSIS — I5032 Chronic diastolic (congestive) heart failure: Secondary | ICD-10-CM

## 2021-06-07 DIAGNOSIS — E782 Mixed hyperlipidemia: Secondary | ICD-10-CM

## 2021-06-07 DIAGNOSIS — I700 Atherosclerosis of aorta: Secondary | ICD-10-CM

## 2021-06-07 NOTE — Patient Instructions (Signed)
Visit Information  Phone number for Pharmacist: [phone]  Thank you for meeting with me to discuss your medications! I look forward to working with you to achieve your health care goals. Below is a summary of what we talked about during the visit:   Goals Addressed   None     Care Plan : [place]  Updates made by Charlton Haws, RPH since 06/07/2021 12:00 AM     Problem: Hypertension, Hyperlipidemia, Atrial Fibrillation, Heart Failure, Coronary Artery Disease, GERD, Depression, and Osteoporosis   Priority: High     Long-Range Goal: Disease mgmt   Start Date: 06/07/2021  Expected End Date: 06/08/2022  This Visit's Progress: On track  Priority: High  Note:   Current Barriers:  None identified  Pharmacist Clinical Goal(s):  Patient will contact provider office for questions/concerns as evidenced notation of same in electronic health record through collaboration with PharmD and provider.   Interventions: 1:1 collaboration with Ria Bush, MD regarding development and update of comprehensive plan of care as evidenced by provider attestation and co-signature Inter-disciplinary care team collaboration (see longitudinal plan of care) Comprehensive medication review performed; medication list updated in electronic medical record  Hypertension / Heart Failure (BP goal <140/90) -Controlled - pt recently complaining of breathing issues, which is why lasix was started recently; it is an effort for her to get up and use bathroom so family will be monitoring closely for improvement in breathing; caregivers are checking BP twice daily -Last ejection fraction: 60-65% (Date: 05/2019) -HF type: Diastolic -NYHA Class: II (slight limitation of activity) -Current home BP readings: 130s-140/80, HR 80s, O2 90-93 -Current treatment: Amlodipine 2.5 mg daily AM - Appropriate, Effective, Safe, Accessible Lisinopril 20 mg daily AM -Appropriate, Effective, Safe,  Accessible Metoprolol succinate 50 mg - 2 tab AM, 1 tab PM -Appropriate, Effective, Safe, Accessible Furosemide 20 mg daily -Appropriate, Query Effective, Safe, Accessible -Medications previously tried: n/a  -Educated on BP goals and benefits of medications for prevention of heart attack, stroke and kidney damage; discussed purpose of furosemide and expected duration of action ~6 hours -Counseled to monitor BP at home 2x daily, document, and provide log at future appointments -Recommended to continue current medication  Hyperlipidemia: (LDL goal < 70) -Controlled - LDL at goal -Hx PAD, CAD, aortic atherosclerosis -Current treatment: Atorvastatin 20 mg daily -Appropriate, Effective, Safe, Accessible -Medications previously tried: n/a  -Educated on Cholesterol goals;  -Recommended to continue current medication  Atrial Fibrillation (Goal: prevent stroke and major bleeding) -Controlled - pt has persistent Afib, HR is at goal at home and pt denies bleeding concerns with Eliquis -CHADSVASC: 6; last cardioversion 12/25/20. -Current treatment: Metoprolol succinate 50 mg - 2 tab AM, 1 tab PM -Appropriate, Effective, Safe, Accessible Eliquis 2.5 mg BID -Appropriate, Effective, Safe, Accessible -Counseled on increased risk of stroke due to Afib and benefits of anticoagulation for stroke prevention; importance of adherence to anticoagulant exactly as prescribed; bleeding risk associated with Eliquis and importance of self-monitoring for signs/symptoms of bleeding; -Recommended to continue current medication  Depression (Goal: manage symptoms) -Controlled - pt reports mood is relatively stable -PHQ9: 3 (01/2021) -Connected with PCP for mental health support -Current treatment: Duloxetine 60 mg daily -Appropriate, Effective, Safe, Accessible -Medications previously tried/failed: n/a -Educated on Benefits of medication for symptom control -Recommended to continue current medication  Cognitive  impairment (Goal: slow progression) -Controlled -Current treatment  Memantine 5 mg BID -Appropriate, Effective, Safe, Accessible -Medications previously tried: n/a -Discussed benefits of memantine for slowing progression  of dementia, not necessarily improving memory  -Recommended to continue current medication  RLS (Goal: manage symptoms) -Controlled - pt did notice improvement with ALA supplement; also uses an OTC "blue goo" that helps; sometimes heating pad helps as well -Current treatment  Ropinirole 1 mg - 2.5 tab HS -Appropriate, Effective, Safe, Accessible Gabapentin 400 mg HS (with extra in PM PRN) -Appropriate, Effective, Safe, Accessible Alpha-lipoic acid 600 mg -Appropriate, Effective, Safe, Accessible -Medications previously tried: n/a  -Recommended to continue current medication  Pain (Goal: manage symptoms) -Controlled - using Tylenol daily, issues with wrists/legs; very rarely uses tramadol; does use tizanidine and gabapentin nightly -Lumbar degenerative disc disease -Current treatment  Gabapentin 400 mg HS (with extra in PM PRN) -Appropriate, Effective, Safe, Accessible Tizanidine 2 mg daily HS - Appropriate, Effective, Safe, Accessible Tylenol 500 mg daily PRN - Appropriate, Effective, Safe, Accessible Tramadol 50 mg - not using much -Appropriate, Effective, Safe, Accessible -Medications previously tried: n/a -Discussed overall combined effect of sedation, pt does take all sedating meds at bedtime and denies sedation during the day  -Recommended to continue current medication  Osteoporosis (Goal prevent fractures) -Controlled -Last DEXA Scan: 06/2019   T-Score femoral neck: -2.3  T-Score forearm radius: -2.4  10-year probability of major osteoporotic fracture: 15.6%  10-year probability of hip fracture: 5.5% -Patient is a candidate for pharmacologic treatment due to T-Score -1.0 to -2.5 and 10-year risk of hip fracture > 3% -Current treatment  Vitamin D 50,000 IU  weekly - Appropriate, Effective, Safe, Accessible Prolia 60 mg q6 months (started 05/2017, last given 01/23/21) - Appropriate, Effective, Safe, Accessible -Medications previously tried: n/a  -Pt has been on high-dose weekly Vitamin D since 06/2018; most recent Vitamin D level was 76 (06/2020) -Recommended to continue current medication  GERD (Goal: manage symptoms) -Controlled - per pt report -Hx gastric ulcer -Current treatment  Pantoprazole 40 mg daily - Appropriate, Effective, Safe, Accessible -Medications previously tried: n/a  -Recommended to continue current medication  Health Maintenance -Vaccine gaps: Shingrix, covid booster -Current therapy:  Docusate 100 mg daily AM Miralax - 1/2 dose daily Hydrocortisone rectal cream Pred Forte eye drops Acyclovir 400 mg BID (Shingles) +Vitamin B12 1000 mcg daily -Patient is satisfied with current therapy and denies issues -Recommended to continue current medication  Patient Goals/Self-Care Activities Patient will:  - take medications as prescribed as evidenced by patient report and record review focus on medication adherence by pill box check blood pressure 2x daily, document, and provide at future appointments      Ms. Mccomb was given information about Chronic Care Management services today including:  CCM service includes personalized support from designated clinical staff supervised by her physician, including individualized plan of care and coordination with other care providers 24/7 contact phone numbers for assistance for urgent and routine care needs. Standard insurance, coinsurance, copays and deductibles apply for chronic care management only during months in which we provide at least 20 minutes of these services. Most insurances cover these services at 100%, however patients may be responsible for any copay, coinsurance and/or deductible if applicable. This service may help you avoid the need for more expensive face-to-face  services. Only one practitioner may furnish and bill the service in a calendar month. The patient may stop CCM services at any time (effective at the end of the month) by phone call to the office staff.  Patient agreed to services and verbal consent obtained.   Patient verbalizes understanding of instructions and care plan provided today and agrees  to view in [place]. Active MyChart status confirmed with patient.   The patient has been provided with contact information for the care management team and has been advised to call with any health related questions or concerns.   Charlene Brooke, PharmD, BCACP Clinical Pharmacist [facility] [phone]

## 2021-06-07 NOTE — Progress Notes (Signed)
Chronic Care Management Pharmacy Note  06/07/2021 Name:  [name] MRN:  [id number] DOB:  02-24-1930  Summary: CCM Initial visit -Spoke with patient's daughter [name] who handles her medications; she sets up the pill box each week and patient has sitter/other family with her at all times to ensure she takes medications as prescribed -Patient recently started daily Lasix to try to improve DOE; pt also struggles to get up to use bathroom more often; pt and family plan to monitor closely to make sure improvement in breathing is worth extra bathroom trips  Recommendations/Changes made from today's visit: -No med changes; advised pt to contact cardiologist if lasix is not effective  Plan: -The patient has been provided with contact information for the care management team and has been advised to call with any health related questions or concerns.  -PCP annual visit due 01/2023   Subjective: [name] is an 88 y.o. year old female who is a primary patient of Crystal Bush, MD.  The CCM team was consulted for assistance with disease management and care coordination needs.    Engaged with patient by telephone for initial visit in response to provider referral for pharmacy case management and/or care coordination services.   Consent to Services:  The patient was given the following information about Chronic Care Management services today, agreed to services, and gave verbal consent: 1. CCM service includes personalized support from designated clinical staff supervised by the primary care provider, including individualized plan of care and coordination with other care providers 2. 24/7 contact phone numbers for assistance for urgent and routine care needs. 3. Service will only be billed when office clinical staff spend 20 minutes or more in a month to coordinate care. 4. Only one practitioner may furnish and bill the service in a calendar month. 5.The patient may stop CCM services at  any time (effective at the end of the month) by phone call to the office staff. 6. The patient will be responsible for cost sharing (co-pay) of up to 20% of the service fee (after annual deductible is met). Patient agreed to services and consent obtained.  Patient Care Team: Crystal Bush, MD as PCP - General (Family Medicine) Wellington Hampshire, MD as Consulting Physician (Cardiology) Leandrew Koyanagi, MD as Referring Physician (Ophthalmology) Sharlet Salina, MD as Referring Physician (Physical Medicine and Rehabilitation) Arvil Chaco, PA-C (Inactive) as Physician Assistant (Cardiology) Charlton Haws, [facility] as Pharmacist (Pharmacist)  Patient lives alone but always has someone there for her between hired sitter and family members.  Recent office visits: 05/30/2021 - Crystal Pardon, MD - Family Medicine - Patient presented for dyspnea. Labs: Brain natriuretic peptide, CMP, Vitamin B12, CBC, Iron, TSH and DG Chest 2 view. Results: "Labs are overall stable including thyroid and blood count and iron level and chemistries. Iron level is good"  02/21/2021 Crystal Bush, MD - Message - Due to blood pressure running high - Start Amlodipine 5 mg daily.  02/20/2021 - Crystal Bush, MD - Message - Due to bloodshot eye (subconjunctival hemorrhage) increase Metoprolol XL to 75 mg twice daily.  02/07/2021 Crystal Bush, MD - Telephone - Take Lasix for next 2-3 days due to leg swelling and redness.  01/30/2021 - Crystal Bush, MD - Patient presented for hypertension. Labs: Renal Function, C-Reactive Protein, CBC and Sedimentation Rate: "blood counts and inflammatory markers returned to normal and kidney function remains mildly impaired but overall very stable". Change: rOPINIRole (REQUIP) 1 MG tablet - take  2.5 oral at bedtime.   01/23/2021 Dalia Heading, CMA - Clinical Support - Patient presented for denosumab (PROLIA) injection 60 mg.  01/23/2021 - Crystal Bush, MD  - Telephone - Increase Gabapentin - add 1 tab in afternoon due to leg swelling. Take 1 tab in afternoon and 1 tab at night.   Recent consult visits: 05/31/2021 - Crystal Sacramento, MD - Cardiology - Telephone Results - Patient has chronic diastolic heart failure. Start: Furosemide 20 mg once daily to improve shortness of breath. "The BNP (test for heart failure) is very mildly elevated (unsure if significant). Chest xray was reassuring". Start: hydrocortisone (ANUSOL-HC) 2.5 % rectal cream due to hemorrhoid.  05/01/2021 - [name], DPM - Podiatry - Patient presented for peripheral vascular disease. Procedure: Debridement of mycotic and hypertrophic toenails, 1 through 5 bilateral and clearing of subungual debris. 04/26/2021 - Crystal Patten, MD - Dermatology - Patient presented for actinic keratosis. Lesions removed. 04/12/2021 - Crystal Sacramento, MD - Cardiology - Patient presented for permanent atrial fibrillation. Ordered: EKG - demonstrates atrial fibrillation with poor R wave progression in the anterior leads.  Ventricular rate is 97 bpm. -Change: amLODipine 2.5 MG tablet vs. 5 mg. -Change: metoprolol succinate 50 MG 24 hr tablet vs. 75 mg. -Stop due to completed course: B12 injection, bactroban 04/04/2021 - Crystal Ditch, NP - Vascular Surgery - Patient presented for peripheral artery disease. No medication changes.  04/04/2021 - [name] - Performed: VAS [place] ABI with/wo TBI 01/23/2021 - [name], DPM - Podiatry - Patient presented for peripheral vascular disease. Procedure: Debridement of mycotic and hypertrophic toenails, 1 through 5 bilateral and clearing of subungual debris. 01/12/2021 - Crystal Mood, PA - Cardiology - Patient presented for atrial flutter. Ordered: EKG. --Discontinue amiodarone --Discontinue amlodipine --Increase to Toprol 37m BID due to persistent atrial flutter / atrial fibrillation. -Stop due to patient not taking silver sulfADIAZINE (SILVADENE) 1  % cream. 12/27/2020 - VAlfonso Patten MD - Dermatology - Patient presented for basal cell carcinoma. Patient had lesion on upper chest removed.  12/22/2020 - JMarrianne Mood PA - Cardiology - Message - Change: metoprolol succinate (TOPROL-XL) 25 MG 24 hr tablet - 3 times daily vs. 2 times daily due to upcoming Cardioversion.  12/15/2020 - JMarrianne Mood PA - Cardiology - Patient presented for atrial flutter. Ordered: EKG shows 2:1 atrial flutter with RVR. Start: furosemide (LASIX) 20 MG tablet - 1 tablet daily.   Hospital visits: 12/25/2020 -[facility]- Patient presented for Cardioversion.  Change: amiodarone (PACERONE) 200 MG tablet 1 tab by mouth daily.  Stop: Aspirin EC 81 mg.  Discharged same day.   Objective:  Lab Results  Component Value Date   CREATININE 0.84 05/30/2021   BUN 13 05/30/2021   GFR 61.37 05/30/2021   EGFR 56 (L) 12/21/2020   GFRNONAA 57 (L) 11/01/2020   GFRAA 43 (L) 10/27/2019   NA 139 05/30/2021   K 4.4 05/30/2021   CALCIUM 9.1 05/30/2021   CO2 33 (H) 05/30/2021   GLUCOSE 93 05/30/2021    Lab Results  Component Value Date/Time   GFR 61.37 05/30/2021 12:53 PM   GFR 51.75 (L) 01/30/2021 04:01 PM   MICROALBUR 1.3 06/15/2020 10:25 AM   MICROALBUR 1.6 04/28/2019 01:41 PM    Last diabetic Eye exam: No results found for: HMDIABEYEEXA  Last diabetic Foot exam: No results found for: HMDIABFOOTEX   Lab Results  Component Value Date   CHOL 133 06/15/2020   HDL 71.70 06/15/2020  LDLCALC 47 06/15/2020   LDLDIRECT 173.1 02/19/2010   TRIG 70.0 06/15/2020   CHOLHDL 2 06/15/2020    Hepatic Function Latest Ref Rng & Units 05/30/2021 01/30/2021 11/01/2020  Total Protein 6.0 - 8.3 g/dL 6.2 - 4.9(L)  Albumin 3.5 - 5.2 g/dL 3.9 4.1 2.5(L)  AST 0 - 37 U/L 13 - 23  ALT 0 - 35 U/L 10 - 71(H)  Alk Phosphatase 39 - 117 U/L 55 - 151(H)  Total Bilirubin 0.2 - 1.2 mg/dL 0.6 - 0.6  Bilirubin, Direct 0.0 - 0.2 mg/dL - - -    Lab Results  Component Value  Date/Time   TSH 2.44 05/30/2021 12:53 PM   TSH 4.440 11/30/2020 01:50 PM   FREET4 0.79 11/18/2013 09:43 AM   FREET4 0.65 03/09/2012 09:53 AM    CBC Latest Ref Rng & Units 05/30/2021 01/30/2021 12/21/2020  WBC 4.0 - 10.5 K/uL 7.1 6.5 7.2  Hemoglobin 12.0 - 15.0 g/dL 13.5 13.5 13.0  Hematocrit 36.0 - 46.0 % 40.9 40.9 38.4  Platelets 150.0 - 400.0 K/uL 232.0 239.0 237    Lab Results  Component Value Date/Time   VD25OH 76.06 06/15/2020 10:12 AM   VD25OH 47.19 04/28/2019 01:31 PM    Clinical ASCVD: Yes  The ASCVD Risk score (Arnett DK, et al., 2019) failed to calculate for the following reasons:   The 2019 ASCVD risk score is only valid for ages 56 to 74    Depression screen PHQ 2/9 01/30/2021 06/21/2020  Decreased Interest 1 3  Down, Depressed, Hopeless 1 1  PHQ - 2 Score 2 4  Altered sleeping 0 3  Tired, decreased energy 1 3  Change in appetite 0 3  Feeling bad or failure about yourself  0 0  Trouble concentrating 0 1  Moving slowly or fidgety/restless 0 1  Suicidal thoughts 0 0  PHQ-9 Score 3 15  Some recent data might be hidden    CHA2DS2/VAS Stroke Risk Points  Current as of 4 hours ago     6 >= 2 Points: High Risk  1 - 1.99 Points: Medium Risk  0 Points: Low Risk    Last Change: N/A     Points Metrics  1 Has Congestive Heart Failure:  Yes    Current as of 4 hours ago  1 Has Vascular Disease:  Yes    Current as of 4 hours ago  1 Has Hypertension:  Yes    Current as of 4 hours ago  2 Age:  14    Current as of 4 hours ago  0 Has Diabetes:  No    Current as of 4 hours ago  0 Had Stroke:  No  Had TIA:  No  Had Thromboembolism:  No    Current as of 4 hours ago  1 Female:  Yes    Current as of 4 hours ago    Social History   Tobacco Use  Smoking Status Former   Packs/day: 0.25   Years: 1.00   Pack years: 0.25   Types: Cigarettes   Quit date: 04/08/1976   Years since quitting: 45.1  Smokeless Tobacco Never  Tobacco Comments   pt smokes occasionally  "socially"   BP Readings from Last 3 Encounters:  05/30/21 132/76  04/12/21 120/64  04/04/21 134/73   Pulse Readings from Last 3 Encounters:  05/30/21 72  04/12/21 97  04/04/21 (!) 59   Wt Readings from Last 3 Encounters:  05/30/21 140 lb (63.5 kg)  04/12/21 139 lb  4 oz (63.2 kg)  04/04/21 139 lb (63 kg)   BMI Readings from Last 3 Encounters:  05/30/21 25.61 kg/m  04/12/21 25.47 kg/m  04/04/21 25.42 kg/m    Assessment/Interventions: Review of patient past medical history, allergies, medications, health status, including review of consultants reports, laboratory and other test data, was performed as part of comprehensive evaluation and provision of chronic care management services.   SDOH:  (Social Determinants of Health) assessments and interventions performed: Yes SDOH Interventions    Flowsheet Row Most Recent Value  SDOH Interventions   Food Insecurity Interventions Intervention Not Indicated  Financial Strain Interventions Intervention Not Indicated      SDOH Screenings   Alcohol Screen: Not on file  Depression (PHQ2-9): Low Risk    PHQ-2 Score: 3  Financial Resource Strain: Low Risk    Difficulty of Paying Living Expenses: Not hard at all  Food Insecurity: No Food Insecurity   Worried About Charity fundraiser in the Last Year: Never true   Ran Out of Food in the Last Year: Never true  Housing: Not on file  Physical Activity: Not on file  Social Connections: Not on file  Stress: Not on file  Tobacco Use: Medium Risk   Smoking Tobacco Use: Former   Smokeless Tobacco Use: Never   Passive Exposure: Not on file  Transportation Needs: Not on file    [place]  Allergies  Allergen Reactions   Penicillins Rash and Other (See Comments)    Childhood Allergy Has patient had a PCN reaction causing immediate rash, facial/tongue/throat swelling, SOB or lightheadedness with hypotension: Yes Has patient had a PCN reaction causing severe rash involving mucus  membranes or skin necrosis: Unknown Has patient had a PCN reaction that required hospitalization: No Has patient had a PCN reaction occurring within the last 10 years: No If all of the above answers are "NO", then may proceed with Cephalosporin use.     Medications Reviewed Today     Reviewed by Charlton Haws, [facility] (Pharmacist) on 06/07/21 at 1427  Med List Status: <None>   Medication Order Taking? Sig Documenting Provider Last Dose Status Informant  acetaminophen (TYLENOL) 500 MG tablet [id number] Yes Take 500 mg by mouth every 6 (six) hours as needed for mild pain. [provider] Taking Active Child  acyclovir (ZOVIRAX) 400 MG tablet [id number] Yes Take 400 mg by mouth 2 (two) times daily. [provider] Taking Active Child           Med Note Royann Shivers A   Mon Dec 04, 2015  2:48 PM)    Alpha-Lipoic Acid 600 MG TABS [id number] Yes Take 600 mg by mouth daily. [provider] Taking Active Child  amLODipine (NORVASC) 2.5 MG tablet [id number] Yes Take 1 tablet (2.5 mg total) by mouth daily. Wellington Hampshire, MD Taking Active   apixaban (ELIQUIS) 2.5 MG TABS tablet [id number] Yes TAKE 1 TABLET BY MOUTH TWICE DAILY [name] D, PA-C Taking Active Child  atorvastatin (LIPITOR) 20 MG tablet [id number] Yes TAKE 1 TABLET BY MOUTH ONCE DAILY Crystal Bush, MD Taking Active Child  Docusate Calcium (STOOL SOFTENER PO) [id number] Yes Take 1 capsule by mouth daily. [provider] Taking Active Child  DULoxetine (CYMBALTA) 60 MG capsule [id number] Yes TAKE 1 CAPSULE BY MOUTH ONCE DAILY Crystal Bush, MD Taking Active   furosemide (LASIX) 20 MG tablet [id number] Yes Take 1 tablet (20 mg total) by mouth daily. Wellington Hampshire, MD Taking Active  gabapentin (NEURONTIN) 400 MG capsule [id number] Yes TAKE 1 CAPSULE BY MOUTH AT BEDTIME AND SECOND DOSE DURING THE DAY AS NEEDED Crystal Bush, MD Taking Active   hydrocortisone (ANUSOL-HC) 2.5 % rectal  cream [id number] Yes Place 1 application rectally daily as needed for hemorrhoids or anal itching. For hemmorhoid [place], Wynelle Fanny, MD Taking Active   lisinopril (ZESTRIL) 20 MG tablet [id number] Yes TAKE 1 TABLET BY MOUTH ONCE DAILY Wellington Hampshire, MD Taking Active   memantine (NAMENDA) 5 MG tablet [id number] Yes TAKE 1 TABLET BY MOUTH TWICE DAILY Crystal Bush, MD Taking Active   metoprolol succinate (TOPROL-XL) 50 MG 24 hr tablet [id number] Yes Take 2 tablets (100 mg) in the morning and 1 tablet (50 mg) in the evening Wellington Hampshire, MD Taking Active   pantoprazole (PROTONIX) 40 MG tablet [id number] Yes TAKE 1 TABLET BY MOUTH ONCE DAILY Crystal Bush, MD Taking Active   polyethylene glycol (MIRALAX / GLYCOLAX) 17 g packet [id number] Yes Take 17 g by mouth daily as needed for mild constipation. Nolberto Hanlon, MD Taking Active Child  prednisoLONE acetate (PRED FORTE) 1 % ophthalmic suspension [id number] Yes Place 1 drop into the left eye daily.  [provider] Taking Active Child  rOPINIRole (REQUIP) 1 MG tablet [id number] Yes TAKE 2 AND 1/2 TABLETS BY MOUTH AT BEDTIME Crystal Bush, MD Taking Active   tiZANidine (ZANAFLEX) 2 MG tablet [id number] Yes Take 2 mg by mouth daily. [provider] Taking Active Child  traMADol (ULTRAM) 50 MG tablet [id number] Yes Take 50-100 mg by mouth at bedtime as needed. [provider] Taking Active   vitamin B-12 (CYANOCOBALAMIN) 1000 MCG tablet [id number] Yes Take 1,000 mcg by mouth daily. [provider] Taking Active   Vitamin D, Ergocalciferol, (DRISDOL) 1.25 MG (50000 UNIT) CAPS capsule [id number] Yes TAKE 1 CAPSULE BY MOUTH ONCE WEEKLY (EVERY 7 DAYS) Crystal Bush, MD Taking Active   Med List Note Willa Frater, CPhT 07/08/19 1228): Med rec done for pre admission on 07/19/2019, will take all meds except eliquis, furosemide             Patient Active Problem List   Diagnosis Date Noted   Malnutrition of  moderate degree ([place]) 11/07/2020   Ascending cholangitis 11/07/2020   Choledocholithiasis 10/27/2020   Bacteremia due to Gram-negative bacteria 10/27/2020   Generalized weakness 10/25/2020   Frequent falls 10/25/2020   Nondisplaced fracture of proximal phalanx of right lesser toe(s), initial encounter for closed fracture 10/13/2020   COVID-19 virus infection 10/12/2020   History of osteomyelitis 10/10/2020   Pedal edema 09/22/2020   Skin cancer (melanoma) ([place]) 07/19/2020   IDA (iron deficiency anemia) 06/27/2020   MCI (mild cognitive impairment) with memory loss 12/28/2019   Chronic diastolic CHF (congestive heart failure) ([place]) 10/25/2019   DNR (do not resuscitate) 10/25/2019   Closed fracture of left distal radius and ulna 10/25/2019   Closed fracture of right wrist    Palliative care by specialist    CKD (chronic kidney disease) stage 3, GFR 30-59 ml/min ([place]) 10/24/2019   Ulcer of right foot ([place]) 08/14/2019   CAD (coronary artery disease), native coronary artery 07/13/2019   Dyspnea on exertion    Pulmonary hypertension, unspecified ([place])    Urge incontinence 05/06/2019   PAD (peripheral artery disease) ([place]) 03/12/2019   Hypertrophic toenail 01/27/2019   Cyanosis 01/12/2019   Hemorrhoid 11/25/2018   Vitamin B12 deficiency 08/04/2018   Vitamin D deficiency 02/25/2018   Peripheral neuropathy 12/04/2017   General unsteadiness  10/01/2017   Chronic constipation 10/01/2017   Esophageal dysphagia 04/08/2017   Chronic radicular pain of lower back 04/08/2017   Fall with injury 03/04/2017   Encounter for chronic pain management 11/11/2016   Breast mass, right 09/29/2016   Abdominal aortic atherosclerosis ([place]) 09/07/2015   Insomnia 06/08/2015   Health maintenance examination 12/08/2014   Advanced care planning/counseling discussion 12/08/2014   Chronic leg pain 10/13/2014   Fatigue 10/28/2013   Persistent atrial fibrillation ([place]) 04/24/2013   Medicare annual wellness visit,  subsequent 04/14/2012   Chest tightness 03/16/2012   RLS (restless legs syndrome) 02/07/2012   DDD (degenerative disc disease), lumbar 12/18/2009   MDD (major depressive disorder), recurrent episode ([place]) 12/10/2007   HLD (hyperlipidemia) 11/24/2006   PLMD (periodic limb movement disorder) 11/24/2006   Essential hypertension 11/24/2006   GERD 11/24/2006   GASTRIC ULCER 11/24/2006   Osteoarthritis 11/24/2006   Osteoporosis 11/24/2006    Immunization History  Administered Date(s) Administered   Fluad Quad(high Dose 65+) 01/19/2020, 01/23/2021   Influenza Split 03/20/2011   Influenza, High Dose Seasonal PF 02/16/2019   Influenza,inj,Quad PF,6+ Mos 12/08/2014, 12/04/2015, 01/07/2017, 01/05/2018   Influenza-Unspecified 03/03/2013   PFIZER(Purple Top)SARS-COV-2 Vaccination 06/01/2019, 06/22/2019   Pneumococcal Conjugate-13 01/18/2015   Pneumococcal Polysaccharide-23 12/08/2003, 06/21/2020   Td 12/08/2003    Conditions to be addressed/monitored:  Hypertension, Hyperlipidemia, Atrial Fibrillation, Heart Failure, Coronary Artery Disease, GERD, Depression, and Osteoporosis  Care Plan : [place]  Updates made by Charlton Haws, [place] since 06/07/2021 12:00 AM     Problem: Hypertension, Hyperlipidemia, Atrial Fibrillation, Heart Failure, Coronary Artery Disease, GERD, Depression, and Osteoporosis   Priority: High     Long-Range Goal: Disease mgmt   Start Date: 06/07/2021  Expected End Date: 06/08/2022  This Visit's Progress: On track  Priority: High  Note:   Current Barriers:  None identified  Pharmacist Clinical Goal(s):  Patient will contact provider office for questions/concerns as evidenced notation of same in electronic health record through collaboration with PharmD and provider.   Interventions: 1:1 collaboration with Crystal Bush, MD regarding development and update of comprehensive plan of care as evidenced by provider attestation and  co-signature Inter-disciplinary care team collaboration (see longitudinal plan of care) Comprehensive medication review performed; medication list updated in electronic medical record  Hypertension / Heart Failure (BP goal <140/90) -Controlled - pt recently complaining of breathing issues, which is why lasix was started recently; it is an effort for her to get up and use bathroom so family will be monitoring closely for improvement in breathing; caregivers are checking BP twice daily -Last ejection fraction: 60-65% (Date: 05/2019) -HF type: Diastolic -NYHA Class: II (slight limitation of activity) -Current home BP readings: 130s-140/80, HR 80s, O2 90-93 -Current treatment: Amlodipine 2.5 mg daily AM - Appropriate, Effective, Safe, Accessible Lisinopril 20 mg daily AM -Appropriate, Effective, Safe, Accessible Metoprolol succinate 50 mg - 2 tab AM, 1 tab PM -Appropriate, Effective, Safe, Accessible Furosemide 20 mg daily -Appropriate, Query Effective, Safe, Accessible -Medications previously tried: n/a  -Educated on BP goals and benefits of medications for prevention of heart attack, stroke and kidney damage; discussed purpose of furosemide and expected duration of action ~6 hours -Counseled to monitor BP at home 2x daily, document, and provide log at future appointments -Recommended to continue current medication  Hyperlipidemia: (LDL goal < 70) -Controlled - LDL at goal -Hx PAD, CAD, aortic atherosclerosis -Current treatment: Atorvastatin 20 mg daily -Appropriate, Effective, Safe, Accessible -Medications previously tried: n/a  -Educated on Cholesterol  goals;  -Recommended to continue current medication  Atrial Fibrillation (Goal: prevent stroke and major bleeding) -Controlled - pt has persistent Afib, HR is at goal at home and pt denies bleeding concerns with Eliquis -CHADSVASC: 6; last cardioversion 12/25/20. -Current treatment: Metoprolol succinate 50 mg - 2 tab AM, 1 tab PM  -Appropriate, Effective, Safe, Accessible Eliquis 2.5 mg BID -Appropriate, Effective, Safe, Accessible -Counseled on increased risk of stroke due to Afib and benefits of anticoagulation for stroke prevention; importance of adherence to anticoagulant exactly as prescribed; bleeding risk associated with Eliquis and importance of self-monitoring for signs/symptoms of bleeding; -Recommended to continue current medication  Depression (Goal: manage symptoms) -Controlled - pt reports Payne is relatively stable -PHQ9: 3 (01/2021) -Connected with PCP for mental health support -Current treatment: Duloxetine 60 mg daily -Appropriate, Effective, Safe, Accessible -Medications previously tried/failed: n/a -Educated on Benefits of medication for symptom control -Recommended to continue current medication  Cognitive impairment (Goal: slow progression) -Controlled -Current treatment  Memantine 5 mg BID -Appropriate, Effective, Safe, Accessible -Medications previously tried: n/a -Discussed benefits of memantine for slowing progression of dementia, not necessarily improving memory  -Recommended to continue current medication  RLS (Goal: manage symptoms) -Controlled - pt did notice improvement with ALA supplement; also uses an OTC "blue goo" that helps; sometimes heating pad helps as well -Current treatment  Ropinirole 1 mg - 2.5 tab HS -Appropriate, Effective, Safe, Accessible Gabapentin 400 mg HS (with extra in PM PRN) -Appropriate, Effective, Safe, Accessible Alpha-lipoic acid 600 mg -Appropriate, Effective, Safe, Accessible -Medications previously tried: n/a  -Recommended to continue current medication  Pain (Goal: manage symptoms) -Controlled - using Tylenol daily, issues with wrists/legs; very rarely uses tramadol; does use tizanidine and gabapentin nightly -Lumbar degenerative disc disease -Current treatment  Gabapentin 400 mg HS (with extra in PM PRN) -Appropriate, Effective, Safe,  Accessible Tizanidine 2 mg daily HS - Appropriate, Effective, Safe, Accessible Tylenol 500 mg daily PRN - Appropriate, Effective, Safe, Accessible Tramadol 50 mg - not using much -Appropriate, Effective, Safe, Accessible -Medications previously tried: n/a -Discussed overall combined effect of sedation, pt does take all sedating meds at bedtime and denies sedation during the day  -Recommended to continue current medication  Osteoporosis (Goal prevent fractures) -Controlled -Last DEXA Scan: 06/2019   T-Score femoral neck: -2.3  T-Score forearm radius: -2.4  10-year probability of major osteoporotic fracture: 15.6%  10-year probability of hip fracture: 5.5% -Patient is a candidate for pharmacologic treatment due to T-Score -1.0 to -2.5 and 10-year risk of hip fracture > 3% -Current treatment  Vitamin D 50,000 IU weekly - Appropriate, Effective, Safe, Accessible Prolia 60 mg q6 months (started 05/2017, last given 01/23/21) - Appropriate, Effective, Safe, Accessible -Medications previously tried: n/a  -Pt has been on high-dose weekly Vitamin D since 06/2018; most recent Vitamin D level was 76 (06/2020) -Recommended to continue current medication  GERD (Goal: manage symptoms) -Controlled - per pt report -Hx gastric ulcer -Current treatment  Pantoprazole 40 mg daily - Appropriate, Effective, Safe, Accessible -Medications previously tried: n/a  -Recommended to continue current medication  Health Maintenance -Vaccine gaps: Shingrix, covid booster -Current therapy:  Docusate 100 mg daily AM Miralax - 1/2 dose daily Hydrocortisone rectal cream Pred Forte eye drops Acyclovir 400 mg BID (Shingles) +Vitamin B12 1000 mcg daily -Patient is satisfied with current therapy and denies issues -Recommended to continue current medication  Patient Goals/Self-Care Activities Patient will:  - take medications as prescribed as evidenced by patient report and record review focus on medication  adherence  by pill box check blood pressure 2x daily, document, and provide at future appointments      Medication Assistance: None required.  Patient affirms current coverage meets needs.  Compliance/Adherence/Medication fill history: Care Gaps: None  Star-Rating Drugs: Lisinopril - PDC 73%; last fill 06/05/21 x 30 ds Atorvastatin - PDC 96%  Patient's preferred pharmacy is:  [facility] [place] [address] [place] [address] Phone: [phone] Fax: [phone]  Uses pill box? Yes Pt endorses 100% compliance  We discussed: Current pharmacy is preferred with insurance plan and patient is satisfied with pharmacy services Patient decided to: Continue current medication management strategy  Care Plan and Follow Up Patient Decision:  Patient agrees to Care Plan and Follow-up.  Plan: The patient has been provided with contact information for the care management team and has been advised to call with any health related questions or concerns.   Charlene Brooke, PharmD, BCACP Clinical Pharmacist [facility] at [facility] [phone]

## 2021-07-02 ENCOUNTER — Other Ambulatory Visit: Payer: Self-pay

## 2021-07-02 MED ORDER — APIXABAN 2.5 MG PO TABS
2.5000 mg | ORAL_TABLET | Freq: Two times a day (BID) | ORAL | 1 refills | Status: DC
Start: 2021-07-02 — End: 2021-09-04

## 2021-07-02 NOTE — Telephone Encounter (Signed)
Eliquis 2.5 mg refill request received. Patient is 88 years old, weight- 63.5 kg, Crea- 0.84 on 05/30/21, Diagnosis- afib, and last seen by Dr. Fletcher Anon on 04/12/21. Dose is appropriate based on dosing criteria. Will send in refill to requested pharmacy.   ?

## 2021-07-06 DIAGNOSIS — I5032 Chronic diastolic (congestive) heart failure: Secondary | ICD-10-CM

## 2021-07-06 DIAGNOSIS — I4819 Other persistent atrial fibrillation: Secondary | ICD-10-CM | POA: Diagnosis not present

## 2021-07-06 DIAGNOSIS — F331 Major depressive disorder, recurrent, moderate: Secondary | ICD-10-CM | POA: Diagnosis not present

## 2021-07-06 DIAGNOSIS — M810 Age-related osteoporosis without current pathological fracture: Secondary | ICD-10-CM

## 2021-07-06 DIAGNOSIS — E782 Mixed hyperlipidemia: Secondary | ICD-10-CM | POA: Diagnosis not present

## 2021-07-06 DIAGNOSIS — I10 Essential (primary) hypertension: Secondary | ICD-10-CM

## 2021-07-09 ENCOUNTER — Telehealth: Payer: Self-pay

## 2021-07-09 ENCOUNTER — Other Ambulatory Visit: Payer: Self-pay | Admitting: Cardiovascular Disease

## 2021-07-09 DIAGNOSIS — M810 Age-related osteoporosis without current pathological fracture: Secondary | ICD-10-CM

## 2021-07-09 NOTE — Telephone Encounter (Signed)
Benefits submitted-pending ?Next injection due 07/25/21 or after ?

## 2021-07-24 ENCOUNTER — Ambulatory Visit: Payer: Medicare Other | Admitting: Cardiovascular Disease

## 2021-07-27 ENCOUNTER — Other Ambulatory Visit: Payer: Self-pay | Admitting: Podiatry

## 2021-07-27 ENCOUNTER — Telehealth: Payer: Self-pay | Admitting: Podiatry

## 2021-07-27 MED ORDER — DOXYCYCLINE HYCLATE 100 MG PO TABS: 100.0000 mg | ORAL_TABLET | Freq: Two times a day (BID) | ORAL | 0 refills | Status: AC

## 2021-07-27 NOTE — Telephone Encounter (Signed)
Patient daughter called there is a spot on her foot that is becoming infected, does she need an antibiotic or should they wait until  she comes in for her follow up appointment?

## 2021-07-30 NOTE — Telephone Encounter (Signed)
Called daughter [name] back and let her know medication was called in

## 2021-07-31 ENCOUNTER — Ambulatory Visit: Payer: Medicare Other | Admitting: Podiatry

## 2021-07-31 DIAGNOSIS — L97512 Non-pressure chronic ulcer of other part of right foot with fat layer exposed: Secondary | ICD-10-CM

## 2021-08-02 ENCOUNTER — Other Ambulatory Visit: Payer: Medicare Other | Admitting: Student

## 2021-08-02 ENCOUNTER — Telehealth: Payer: Self-pay | Admitting: Podiatry

## 2021-08-02 DIAGNOSIS — J309 Allergic rhinitis, unspecified: Secondary | ICD-10-CM | POA: Diagnosis not present

## 2021-08-02 DIAGNOSIS — I4819 Other persistent atrial fibrillation: Secondary | ICD-10-CM | POA: Diagnosis not present

## 2021-08-02 DIAGNOSIS — I5032 Chronic diastolic (congestive) heart failure: Secondary | ICD-10-CM

## 2021-08-02 DIAGNOSIS — Z515 Encounter for palliative care: Secondary | ICD-10-CM | POA: Diagnosis not present

## 2021-08-02 DIAGNOSIS — S91302D Unspecified open wound, left foot, subsequent encounter: Secondary | ICD-10-CM

## 2021-08-02 DIAGNOSIS — L97512 Non-pressure chronic ulcer of other part of right foot with fat layer exposed: Secondary | ICD-10-CM

## 2021-08-02 NOTE — Telephone Encounter (Signed)
Pt's daughter,[name] called in for 2 issues concerning mom, [name]. Mom was seen in the office on 4/25 by dr. Posey Pronto. Referral is needed for [facility] appt has been made for 5/9 but there office still needs referral sent. They said it can be faxed or put in Epic. There num is [phone], fax num was not given. Also, mom's wound is open and daughter wants to know how to treat wound until mom is seen at the wound care. Please advise. ?

## 2021-08-02 NOTE — Progress Notes (Signed)
? ? ?Manufacturing engineer ?Community Palliative Care Consult Note ?Telephone: [phone]  ?Fax: [phone]  ? ? ?Date of encounter: 08/02/21 ?10:18 AM ?PATIENT NAME: [name] ?[place] [id number]   ?[phone] (home)  ?DOB: 1929-08-24 ?MRN: [id number] ?PRIMARY CARE PROVIDER:    ?Ria Bush, MD,  ?[address] [place] [address] ?[phone] ? ?REFERRING PROVIDER:   ?Ria Bush, MD ?[address] ?[place],  [place] [address] ?[phone] ? ?RESPONSIBLE PARTY:    ?Contact Information   ? ? Name Relation Home Work Mobile  ? Coleman,Patricia Daughter [phone]    ? Zimmerman,June Daughter [phone]  [phone]  ? Coble,Tina Daughter [phone]  [phone]  ? Capps,Susan Daughter   [phone]  ? ?  ? ? ? ?I met face to face with patient and family in the home. Palliative Care was asked to follow this patient by consultation request of  Ria Bush, MD to address advance care planning and complex medical decision making. This is a follow up visit. ? ?                                 ASSESSMENT AND PLAN / RECOMMENDATIONS:  ? ?Advance Care Planning/Goals of Care: Goals include to maximize quality of life and symptom management. Patient/health care surrogate gave his/her permission to discuss. ?Our advance care planning conversation included a discussion about:    ?The value and importance of advance care planning  ?Experiences with loved ones who have been seriously ill or have died  ?Exploration of personal, cultural or spiritual beliefs that might influence medical decisions  ?Exploration of goals of care in the event of a sudden injury or illness  ?CODE STATUS: DNR ? ?Palliative Medicine will continue to provide ongoing support, symptom management.  ? ?Symptom Management/Plan: ? ?Heart failure-patient endorses shortness of breath with exertion, fatigue. Monitor for LE edema, continue to elevate legs. Continue furosemide,  metoprolol as directed. Daily weights.  ? ?Atrial fibrillation-patient had cardioversion 12/2020. Continues on metoprolol, Eliquis as directed. Follow up with cardiology as scheduled. ? ?Left foot wounds-patient has area to right lateral foot and 3rd toe where she trimmed nail earlier this week. She has been started on doxycycline and is to follow up with wound clinic. Family currently applying Vaseline gauze to right lateral foot. Patient advised not to trim nails.  ? ?Allergic rhinitis-symptoms consistent with allergic rhinitis. Start loratadine 10 mg daily.  ? ?Follow up Palliative Care Visit: Palliative care will continue to follow for complex medical decision making, advance care planning, and clarification of goals. Return in 6-8 weeks or prn. ? ? ?This visit was coded based on medical decision making (MDM). ? ?PPS: 40% ? ?HOSPICE ELIGIBILITY/DIAGNOSIS: TBD ? ?Chief Complaint: Palliative Medicine  ? ?HISTORY OF PRESENT ILLNESS:  [name] is a 88 y.o. year old female  with PVD, chronic ulcer to right foot, osteomyelitis, chronic diastolic heart failure, mild dementia, polyneuropathy, RLS, hypertension, atrial fibrillation, hyperlipidemia, CKD stage 3.   ? ?Patient has been doing well. She has caregiver daily and good family support. Endorses fatigue, shortness of breath with exertion. Using walker for ambulation. Good appetite. Patient reports clearing throat more, clear nasal drainage. Patient had nails trimmed recently by podiatry. She later trimmed nail and nicked herself. She also had shoe rub to right lateral foot, area blistered and is now changing in appearance. Daughter reports patient having increased  forgetfulness. A 10-point ROS is negative, except for the pertinent positives and negatives detailed per the HPI.  ? ? ?History obtained from review of EMR, discussion with primary team, and interview with family, facility staff/caregiver and/or [name].  ?I reviewed available labs, medications,  imaging, studies and related documents from the EMR.  Records reviewed and summarized above.  ? ?Physical Exam: ? ?Pulse 84, resp 20, b/p 132/78, sats 91% on room air ?Constitutional: NAD ?General: frail appearing ?EYES: anicteric sclera, lids intact, no discharge  ?ENMT: intact hearing, oral mucous membranes moist, dentition intact ?CV: S1S2, RRR, trace pedal edema ?Pulmonary: LCTA, no increased work of breathing, no cough, room air ?Abdomen:  normo-active BS + 4 quadrants, soft and non tender, no ascites ?GU: deferred ?MSK: moves all extremities, ambulatory with rollator ?Skin: warm and dry, ?Neuro: generalized weakness, A & O x 3, forgetful ?Psych: non-anxious affect, pleasant ?Hem/lymph/immuno: no widespread bruising ? ? ?Thank you for the opportunity to participate in the care of [name].  The palliative care team will continue to follow. Please call our office at [phone] if we can be of additional assistance.  ? ?Ezekiel Slocumb, NP  ? ?COVID-19 PATIENT SCREENING TOOL ?Asked and negative response unless otherwise noted:  ? ?Have you had symptoms of covid, tested positive or been in contact with someone with symptoms/positive test in the past 5-10 days? No ? ?

## 2021-08-03 ENCOUNTER — Encounter: Payer: Medicare Other | Attending: Physician Assistant | Admitting: Physician Assistant

## 2021-08-03 DIAGNOSIS — I480 Paroxysmal atrial fibrillation: Secondary | ICD-10-CM | POA: Diagnosis not present

## 2021-08-03 DIAGNOSIS — G603 Idiopathic progressive neuropathy: Secondary | ICD-10-CM | POA: Insufficient documentation

## 2021-08-03 DIAGNOSIS — I7389 Other specified peripheral vascular diseases: Secondary | ICD-10-CM | POA: Insufficient documentation

## 2021-08-03 DIAGNOSIS — Z7901 Long term (current) use of anticoagulants: Secondary | ICD-10-CM | POA: Insufficient documentation

## 2021-08-03 DIAGNOSIS — L97512 Non-pressure chronic ulcer of other part of right foot with fat layer exposed: Secondary | ICD-10-CM | POA: Insufficient documentation

## 2021-08-03 DIAGNOSIS — L89896 Pressure-induced deep tissue damage of other site: Secondary | ICD-10-CM | POA: Diagnosis not present

## 2021-08-03 NOTE — Progress Notes (Signed)
DESEREE, ZEMAITIS ([id number]) ?Visit Report for 08/03/2021 ?Abuse Risk Screen Details ?Patient Name: [name], [name] ?Date of Service: 08/03/2021 12:45 PM ?Medical Record Number: [id number] ?Patient Account Number: [id number] ?Date of Birth/Sex: 03/29/1930 (88 y.o. F) ?Treating RN: Levora Dredge ?Primary Care Breyah Akhter: Ria Bush Other Clinician: ?Referring Shadee Rathod: Boneta Lucks ?Treating Petrice Beedy/Extender: Jeri Cos ?Weeks in Treatment: 0 ?Abuse Risk Screen Items ?Answer ?ABUSE RISK SCREEN: ?Has anyone close to you tried to hurt or harm you recentlyo No ?Do you feel uncomfortable with anyone in your familyo No ?Has anyone forced you do things that you didnot want to doo No ?Electronic Signature(s) ?Signed: 08/03/2021 3:54:02 PM By: Levora Dredge ?Entered By: Levora Dredge on 08/03/2021 13:02:50 ?BRYAH, OCHELTREE ([id number]) ?-------------------------------------------------------------------------------- ?Activities of Daily Living Details ?Patient Name: [name], [name] ?Date of Service: 08/03/2021 12:45 PM ?Medical Record Number: [id number] ?Patient Account Number: [id number] ?Date of Birth/Sex: 04-24-1929 (88 y.o. F) ?Treating RN: Levora Dredge ?Primary Care Donya Hitch: Ria Bush Other Clinician: ?Referring Quiana Cobaugh: Boneta Lucks ?Treating Marius Betts/Extender: Jeri Cos ?Weeks in Treatment: 0 ?Activities of Daily Living Items ?Answer ?Activities of Daily Living (Please select one for each item) ?Drive Automobile Not Able ?Take Medications Need Assistance ?Use Telephone Completely Able ?Care for Appearance Need Assistance ?Use Toilet Completely Able ?Bath / Shower Need Assistance ?Dress Self Need Assistance ?Feed Self Completely Able ?Walk Need Assistance ?Get In / Out Bed Need Assistance ?Housework Need Assistance ?Prepare Meals Need Assistance ?Handle Money Need Assistance ?Shop for Self Need Assistance ?Electronic Signature(s) ?Signed: 08/03/2021 3:54:02 PM By: Levora Dredge ?Entered By:  Levora Dredge on 08/03/2021 13:03:40 ?AMBRA, HAVERSTICK ([id number]) ?-------------------------------------------------------------------------------- ?Education Screening Details ?Patient Name: [name], [name] ?Date of Service: 08/03/2021 12:45 PM ?Medical Record Number: [id number] ?Patient Account Number: [id number] ?Date of Birth/Sex: 1929/09/28 (88 y.o. F) ?Treating RN: Levora Dredge ?Primary Care Dorene Bruni: Ria Bush Other Clinician: ?Referring Teka Chanda: Boneta Lucks ?Treating Rosio Weiss/Extender: Jeri Cos ?Weeks in Treatment: 0 ?Learning Preferences/Education Level/Primary Language ?Learning Preference: Explanation, Demonstration, Video, Communication Board, Printed Material ?Preferred Language: English ?Cognitive Barrier ?Language Barrier: No ?Translator Needed: No ?Memory Deficit: No ?Emotional Barrier: No ?Cultural/Religious Beliefs Affecting Medical Care: No ?Physical Barrier ?Impaired Vision: No ?Impaired Hearing: No ?Decreased Hand dexterity: No ?Knowledge/Comprehension ?Knowledge Level: High ?Comprehension Level: High ?Ability to understand written instructions: High ?Ability to understand verbal instructions: High ?Motivation ?Anxiety Level: Calm ?Cooperation: Cooperative ?Education Importance: Acknowledges Need ?Interest in Health Problems: Asks Questions ?Perception: Coherent ?Willingness to Engage in Self-Management ?High ?Activities: ?Readiness to Engage in Self-Management ?High ?Activities: ?Electronic Signature(s) ?Signed: 08/03/2021 3:54:02 PM By: Levora Dredge ?Entered By: Levora Dredge on 08/03/2021 13:04:16 ?RATEEL, BELDIN ([id number]) ?-------------------------------------------------------------------------------- ?Fall Risk Assessment Details ?Patient Name: [name], [name] ?Date of Service: 08/03/2021 12:45 PM ?Medical Record Number: [id number] ?Patient Account Number: [id number] ?Date of Birth/Sex: Jun 21, 1929 (88 y.o. F) ?Treating RN: Levora Dredge ?Primary Care Eduar Kumpf:  Ria Bush Other Clinician: ?Referring Rosanna Bickle: Boneta Lucks ?Treating Sante Biedermann/Extender: Jeri Cos ?Weeks in Treatment: 0 ?Fall Risk Assessment Items ?Have you had 2 or more falls in the last 12 monthso 0 Yes ?Have you had any fall that resulted in injury in the last 12 monthso 0 Yes ?FALLS RISK SCREEN ?History of falling - immediate or within 3 months 0 No ?Secondary diagnosis (Do you have 2 or more medical diagnoseso) 0 No ?Ambulatory aid ?None/bed rest/wheelchair/nurse 0 No ?Crutches/cane/walker 15 Yes ?Furniture 0 No ?Intravenous therapy Access/Saline/Heparin Lock 0 No ?Gait/Transferring ?Normal/ bed rest/ wheelchair 0 No ?Weak (short steps with or without shuffle, stooped but able to  lift head while walking, may ?10 Yes ?seek support from furniture) ?Impaired (short steps with shuffle, may have difficulty arising from chair, head down, impaired ?0 No ?balance) ?Mental Status ?Oriented to own ability 0 Yes ?Electronic Signature(s) ?Signed: 08/03/2021 3:54:02 PM By: Levora Dredge ?Entered By: Levora Dredge on 08/03/2021 13:05:45 ?LEKESHA, CLAW ([id number]) ?-------------------------------------------------------------------------------- ?Foot Assessment Details ?Patient Name: [name], [name] ?Date of Service: 08/03/2021 12:45 PM ?Medical Record Number: [id number] ?Patient Account Number: [id number] ?Date of Birth/Sex: 12/04/1929 (88 y.o. F) ?Treating RN: Levora Dredge ?Primary Care Claron Rosencrans: Ria Bush Other Clinician: ?Referring Dream Harman: Boneta Lucks ?Treating Blenda Wisecup/Extender: Jeri Cos ?Weeks in Treatment: 0 ?Foot Assessment Items ?Site Locations ?+ = Sensation present, - = Sensation absent, C = Callus, U = Ulcer ?R = Redness, W = Warmth, M = Maceration, PU = Pre-ulcerative lesion ?F = Fissure, S = Swelling, D = Dryness ?Assessment ?Right: Left: ?Other Deformity: No No ?Prior Foot Ulcer: No No ?Prior Amputation: No No ?Charcot Joint: No No ?Ambulatory Status: Ambulatory With  Help ?Assistance Device: Walker ?Gait: Steady ?Electronic Signature(s) ?Signed: 08/03/2021 3:54:02 PM By: Levora Dredge ?Entered By: Levora Dredge on 08/03/2021 13:17:52 ?ANNE-MARIE, GENSON ([id number]) ?-------------------------------------------------------------------------------- ?Nutrition Risk Screening Details ?Patient Name: [name], [name] ?Date of Service: 08/03/2021 12:45 PM ?Medical Record Number: [id number] ?Patient Account Number: [id number] ?Date of Birth/Sex: Sep 26, 1929 (88 y.o. F) ?Treating RN: Levora Dredge ?Primary Care Maryem Shuffler: Ria Bush Other Clinician: ?Referring Sherlyn Ebbert: Boneta Lucks ?Treating Legrand Lasser/Extender: Jeri Cos ?Weeks in Treatment: 0 ?Height (in): 62 ?Weight (lbs): 140 ?Body Mass Index (BMI): 25.6 ?Nutrition Risk Screening Items ?Score Screening ?NUTRITION RISK SCREEN: ?I have an illness or condition that made me change the kind and/or amount of food I eat 0 No ?I eat fewer than two meals per day 0 No ?I eat few fruits and vegetables, or milk products 0 No ?I have three or more drinks of beer, liquor or wine almost every day 0 No ?I have tooth or mouth problems that make it hard for me to eat 0 No ?I don't always have enough money to buy the food I need 0 No ?I eat alone most of the time 0 No ?I take three or more different prescribed or over-the-counter drugs a day 0 No ?Without wanting to, I have lost or gained 10 pounds in the last six months 0 No ?I am not always physically able to shop, cook and/or feed myself 0 No ?Nutrition Protocols ?Good Risk Protocol 0 No interventions needed ?Moderate Risk Protocol ?High Risk Proctocol ?Risk Level: Good Risk ?Score: 0 ?Electronic Signature(s) ?Signed: 08/03/2021 3:54:02 PM By: Levora Dredge ?Entered By: Levora Dredge on 08/03/2021 13:06:13 ?

## 2021-08-03 NOTE — Progress Notes (Signed)
RAIMI, GUILLERMO ([id number]) ?Visit Report for 08/03/2021 ?Allergy List Details ?Patient Name: RAILEY, GLAD ?Date of Service: 08/03/2021 12:45 PM ?Medical Record Number: [id number] ?Patient Account Number: [id number] ?Date of Birth/Sex: 1929-10-25 (88 y.o. F) ?Treating RN: Levora Dredge ?Primary Care Tersa Fotopoulos: Ria Bush Other Clinician: ?Referring Tydarius Yawn: Boneta Lucks ?Treating Pansie Guggisberg/Extender: Jeri Cos ?Weeks in Treatment: 0 ?Allergies ?Active Allergies ?penicillin ?Allergy Notes ?Electronic Signature(s) ?Signed: 08/03/2021 3:54:02 PM By: Levora Dredge ?Entered By: Levora Dredge on 08/03/2021 12:58:02 ?KALIOPE, QUINONEZ ([id number]) ?-------------------------------------------------------------------------------- ?Arrival Information Details ?Patient Name: LEYLA, SOLIZ ?Date of Service: 08/03/2021 12:45 PM ?Medical Record Number: [id number] ?Patient Account Number: [id number] ?Date of Birth/Sex: 1929-11-24 (88 y.o. F) ?Treating RN: Levora Dredge ?Primary Care Tilia Faso: Ria Bush Other Clinician: ?Referring Alfredia Desanctis: Boneta Lucks ?Treating Braden Deloach/Extender: Jeri Cos ?Weeks in Treatment: 0 ?Visit Information ?Patient Arrived: Wheel Chair ?Arrival Time: 12:56 ?Accompanied By: family ?Transfer Assistance: EasyPivot Patient Lift ?Patient Identification Verified: Yes ?Secondary Verification Process Completed: Yes ?Patient Has Alerts: Yes ?Patient Alerts: Patient on Blood Thinner ?ABI12/28/22 L 1.18 R 1.18 ?History Since Last Visit ?Added or deleted any medications: No ?Any new allergies or adverse reactions: No ?Had a fall or experienced change in activities of daily living that may affect risk of falls: No ?Hospitalized since last visit: No ?Has Dressing in Place as Prescribed: Yes ?Electronic Signature(s) ?Signed: 08/03/2021 3:54:02 PM By: Levora Dredge ?Entered By: Levora Dredge on 08/03/2021 13:43:05 ?NITZA, SCHMID  ([id number]) ?-------------------------------------------------------------------------------- ?Clinic Level of Care Assessment Details ?Patient Name: MAIRIM, BADE ?Date of Service: 08/03/2021 12:45 PM ?Medical Record Number: [id number] ?Patient Account Number: [id number] ?Date of Birth/Sex: November 24, 1929 (88 y.o. F) ?Treating RN: Levora Dredge ?Primary Care Hans Rusher: Ria Bush Other Clinician: ?Referring Cathlyn Tersigni: Boneta Lucks ?Treating Basim Bartnik/Extender: Jeri Cos ?Weeks in Treatment: 0 ?Clinic Level of Care Assessment Items ?TOOL 1 Quantity Score ?'[]'$  - Use when EandM and Procedure is performed on INITIAL visit 0 ?ASSESSMENTS - Nursing Assessment / Reassessment ?X - General Physical Exam (combine w/ comprehensive assessment (listed just below) when performed on new ?1 20 ?pt. evals) ?X- 1 25 ?Comprehensive Assessment (HX, ROS, Risk Assessments, Wounds Hx, etc.) ?ASSESSMENTS - Wound and Skin Assessment / Reassessment ?'[]'$  - Dermatologic / Skin Assessment (not related to wound area) 0 ?ASSESSMENTS - Ostomy and/or Continence Assessment and Care ?'[]'$  - Incontinence Assessment and Management 0 ?'[]'$  - 0 ?Ostomy Care Assessment and Management (repouching, etc.) ?PROCESS - Coordination of Care ?X - Simple Patient / Family Education for ongoing care 1 15 ?'[]'$  - 0 ?Complex (extensive) Patient / Family Education for ongoing care ?X- 1 10 ?Staff obtains Consents, Records, Test Results / Process Orders ?'[]'$  - 0 ?Staff telephones HHA, Nursing Homes / Clarify orders / etc ?'[]'$  - 0 ?Routine Transfer to another Facility (non-emergent condition) ?'[]'$  - 0 ?Routine Hospital Admission (non-emergent condition) ?X- 1 15 ?New Admissions / Biomedical engineer / Ordering NPWT, Apligraf, etc. ?'[]'$  - 0 ?Emergency Hospital Admission (emergent condition) ?PROCESS - Special Needs ?'[]'$  - Pediatric / Minor Patient Management 0 ?'[]'$  - 0 ?Isolation Patient Management ?'[]'$  - 0 ?Hearing / Language / Visual special needs ?'[]'$  - 0 ?Assessment of  Community assistance (transportation, D/C planning, etc.) ?'[]'$  - 0 ?Additional assistance / Altered mentation ?'[]'$  - 0 ?Support Surface(s) Assessment (bed, cushion, seat, etc.) ?INTERVENTIONS - Miscellaneous ?'[]'$  - External ear exam 0 ?'[]'$  - 0 ?Patient Transfer (multiple staff / Civil Service fast streamer / Similar devices) ?'[]'$  - 0 ?Simple Staple / Suture removal (25 or less) ?'[]'$  - 0 ?Complex Staple /  Suture removal (26 or more) ?'[]'$  - 0 ?Hypo/Hyperglycemic Management (do not check if billed separately) ?'[]'$  - 0 ?Ankle / Brachial Index (ABI) - do not check if billed separately ?Has the patient been seen at the hospital within the last three years: Yes ?Total Score: 85 ?Level Of Care: New/Established - Level ?3 ?DESERAI, CANSLER ([id number]) ?Electronic Signature(s) ?Signed: 08/03/2021 3:54:02 PM By: Levora Dredge ?Entered By: Levora Dredge on 08/03/2021 14:06:05 ?DESI, CARBY ([id number]) ?-------------------------------------------------------------------------------- ?Encounter Discharge Information Details ?Patient Name: JOANIE, DUPREY ?Date of Service: 08/03/2021 12:45 PM ?Medical Record Number: [id number] ?Patient Account Number: [id number] ?Date of Birth/Sex: 18-Mar-1930 (88 y.o. F) ?Treating RN: Levora Dredge ?Primary Care Emaleigh Guimond: Ria Bush Other Clinician: ?Referring Okechukwu Regnier: Boneta Lucks ?Treating Travia Onstad/Extender: Jeri Cos ?Weeks in Treatment: 0 ?Encounter Discharge Information Items ?Discharge Condition: Stable ?Ambulatory Status: Wheelchair ?Discharge Destination: Home ?Transportation: Private Auto ?Accompanied By: family ?Schedule Follow-up Appointment: No ?Clinical Summary of Care: Patient Declined ?Notes ?consult only for todays visit, no open area. ?Electronic Signature(s) ?Signed: 08/03/2021 3:58:53 PM By: Levora Dredge ?Previous Signature: 08/03/2021 3:54:02 PM Version By: Levora Dredge ?Entered By: Levora Dredge on 08/03/2021 15:58:53 ?KARALYN, KADEL  ([id number]) ?-------------------------------------------------------------------------------- ?Lower Extremity Assessment Details ?Patient Name: BRITTNAE, ASCHENBRENNER ?Date of Service: 08/03/2021 12:45 PM ?Medical Record Number: [id number] ?Patient Account Number: [id number] ?Date of Birth/Sex: Mar 03, 1930 (88 y.o. F) ?Treating RN: Levora Dredge ?Primary Care Kaniel Kiang: Ria Bush Other Clinician: ?Referring Jaxsen Bernhart: Boneta Lucks ?Treating Journie Howson/Extender: Jeri Cos ?Weeks in Treatment: 0 ?Edema Assessment ?Assessed: [Left: No] [Right: No] ?Edema: [Left: N] [Right: o] ?Calf ?Left: Right: ?Point of Measurement: 31 cm From Medial Instep 30 cm ?Ankle ?Left: Right: ?Point of Measurement: 10 cm From Medial Instep 18 cm ?Vascular Assessment ?Pulses: ?Dorsalis Pedis ?Palpable: [Right:Yes] ?Posterior Tibial ?Palpable: [Right:Yes] ?Electronic Signature(s) ?Signed: 08/03/2021 3:54:02 PM By: Levora Dredge ?Entered By: Levora Dredge on 08/03/2021 13:21:06 ?JAYONA, MCCAIG ([id number]) ?-------------------------------------------------------------------------------- ?Multi Wound Chart Details ?Patient Name: JILLIAM, BELLMORE ?Date of Service: 08/03/2021 12:45 PM ?Medical Record Number: [id number] ?Patient Account Number: [id number] ?Date of Birth/Sex: 03-09-1930 (88 y.o. F) ?Treating RN: Levora Dredge ?Primary Care Gladyes Kudo: Ria Bush Other Clinician: ?Referring Jaqlyn Gruenhagen: Boneta Lucks ?Treating Jacquese Hackman/Extender: Jeri Cos ?Weeks in Treatment: 0 ?Vital Signs ?Height(in): 62 ?Pulse(bpm): 68 ?Weight(lbs): 140 ?Blood Pressure(mmHg): 122/78 ?Body Mass Index(BMI): 25.6 ?Temperature(??F): 98.1 ?Respiratory Rate(breaths/min): 18 ?Wound Assessments ?Treatment Notes ?Electronic Signature(s) ?Signed: 08/03/2021 3:54:02 PM By: Levora Dredge ?Entered By: Levora Dredge on 08/03/2021 14:05:41 ?VINAYA, SANCHO ([id number]) ?-------------------------------------------------------------------------------- ?Multi-Disciplinary  Care Plan Details ?Patient Name: SCARLETH, BRAME ?Date of Service: 08/03/2021 12:45 PM ?Medical Record Number: [id number] ?Patient Account Number: [id number] ?Date of Birth/Sex: October 24, 1929 (88 y.o. F) ?Treating RN: Levora Dredge ?Primary Care Reubin Bushnell: Ria Bush Other Clinician: ?Referring Provid

## 2021-08-03 NOTE — Progress Notes (Signed)
REIA, VIERNES ([id number]) ?Visit Report for 08/03/2021 ?Chief Complaint Document Details ?Patient Name: [name], [name] ?Date of Service: 08/03/2021 12:45 PM ?Medical Record Number: [id number] ?Patient Account Number: [id number] ?Date of Birth/Sex: 1929/10/24 (88 y.o. F) ?Treating RN: Levora Dredge ?Primary Care Provider: Ria Bush Other Clinician: ?Referring Provider: Boneta Lucks ?Treating Provider/Extender: Jeri Cos ?Weeks in Treatment: 0 ?Information Obtained from: Patient ?Chief Complaint ?Right Lateral Foot Ulcer ?Electronic Signature(s) ?Signed: 08/03/2021 1:41:22 PM By: Worthy Keeler PA-C ?Entered By: Worthy Keeler on 08/03/2021 13:41:22 ?KALEISHA, BHARGAVA ([id number]) ?-------------------------------------------------------------------------------- ?HPI Details ?Patient Name: Crystal, Payne ?Date of Service: 08/03/2021 12:45 PM ?Medical Record Number: [id number] ?Patient Account Number: [id number] ?Date of Birth/Sex: [birth year] (88 y.o. F) ?Treating RN: Levora Dredge ?Primary Care Provider: Ria Bush Other Clinician: ?Referring Provider: Boneta Lucks ?Treating Provider/Extender: Jeri Cos ?Weeks in Treatment: 0 ?History of Present Illness ?HPI Description: 10/12/2019 upon evaluation today patient appears to be doing somewhat poorly in regard to her wound at this point. She has a ?ulcer over the right fifth metatarsal region. This has been managed by podiatry up to this point. Unfortunately the patient just does not seem to ?be making the progress that they were hoping for an subsequently the patient was referred to [place] by [facility] Dr. Posey Pronto. With ?that being said I did note that the patient does have peripheral vascular disease which has been managed by Dr. Lazaro Arms her most recent angiogram ?with intervention was on 08/02/2019. The ABIs following were on the right 1.30 with a TBI of 0.61 normal left 1.31 with a TBI of 0.58. Obviously ?things seem to be doing somewhat  better at this point hopefully enough to allow her to heal. She is on anticoagulant therapy due to atrial ?fibrillation long-term. With that being said the patient is unfortunately having quite a bit of discomfort at this time secondary to the wound. She ?also has erythema which is very likely secondary to infection. I am going obtain a culture of the area today once debridement is complete. ?Apparently the patient also had a x-ray that was performed by Dr. Posey Pronto on 09/21/2019 and this showed that the patient had no obvious signs on ?x-ray of cortical irregularity and no osteomyelitis obviously noted. With that being said as I discussed with the patient's daughter as well this does ?not indicate that the patient absolutely has no osteomyelitis as x-rays are only at best 50% helpful in identifying this complication. Nonetheless I ?do believe that we need to have the patient continue to have this further evaluated specifically I am to recommend that we check into an MRI ?which I think would be ideal to evaluate whether or not there is osteomyelitis present. ?7/16; this is a patient with an ulcer over the right fifth metatarsal head. She follows with Dr. Lucky Cowboy [facility]. She is due to have an MRI ?of the foot on 7/26. Using Santyl to the wound ?11/04/2019 on evaluation today patient appears to be doing okay in regard to her wound at this time. Unfortunately since I last saw her which was ?actually her second visit here in the clinic she has moved into a new apartment and then subsequently had a fall. She ended up in the hospital she ?did fracture her left arm and subsequently has also been dealing with a lot of dizziness she has been at [employer]. She tells me that ?getting in the change the dressing has been somewhat difficult as far as daily dressing  changes are concerned with the Santyl. Nonetheless when ?they do change it that is what is been used and her daughter-in-law has also been coming in to  help out as well with the dressing changes to try ?to make sure it gets done daily. Fortunately there is no signs of active infection at this time. No fever chills noted. I do believe the Santyl has been ?beneficial and is likely still the best way to go at this point for the patient. ?I did review the patient's MRI which showed some potential for reactive osteitis but did not show any signs of osteomyelitis overtly. That cannot ?be excluded but again right now it does not appear that is very likely present as well. This is good news. I also did review her culture she has been ?on the Bactrim she tells me she has taken this and again that was appropriate based on the results of her culture. ?11/19/2019 upon evaluation today patient appears to be doing decently well in regard to her wound. Fortunately there is no signs of active ?infection at this time wound is not significantly larger in fact is roughly about the same at this point. With that being said there is not as much ?erythema as previously noted I think the infection seems to be under much better control which is great news. Unfortunately she has fallen since I ?last saw her and broken her left arm she has an appointment with them following the appointment with me. ?8/25; patient was worked in acutely today at the request of home health and was concerned about her right heel, generalized erythema in her ?feet bilaterally. According to her daughter things have been getting worse over the last for 5 days but not precisely from a wound care point of ?view. Her wound is on the lateral part of the right fifth metatarsal head looks about the same as I remember this. She has developed dryness and ?flaking of the skin on her plantar feet. As well a very tender area localized over the insertion point of the Achilles tendon. She has restless leg ?syndrome. Does not specifically offload the area at night. She wears socks at home and does not really give me a pressure on  the right heel issue. ?I reviewed her vascular status. She underwent her last revascularization on 08/02/2019 by Dr. Lucky Cowboy. This included an angioplasty of the right ?peroneal artery as well as the right posterior tibial artery. Her last noninvasive arterial study was on 11/23/2019. On the right her ABI was 1.08 ?TBI of 0.48. On the left ABI at 1.21 with a TBI of 0.54. Family relates that Dr. Lucky Cowboy felt that he had done the best he could and this. ?We have been using Santyl to the one wound she has on the right lateral fifth metatarsal head. She does not have a new wound today which is ?the concern that letter coming into the clinic ?12/09/2019 on evaluation today patient appears to be doing decently well in regard to her foot ulcer at this point. She does note that with the ?compression wrap that the home health nurse put on after contacting our clinic that she actually did much better and felt much better. Her heel is ?also improved. Fortunately there is no signs of active infection at this time. No fevers, chills, nausea, vomiting, or diarrhea. ?12/24/2019 upon evaluation today patient appears to be doing somewhat poorly still in regard to her foot. Unfortunately this is just not showing ?the signs of improvement that I  would like to see it is a little bit cleaner but also appears to be erythematous and I believe there is infection noted ?at this point. We will get a need to address this in my opinion. I did actually recommend that we switch back to the Iodoflex as well apparently the ?home health nurse had switch this away that not really what we want to do at this point in my opinion. They were using silver alginate I think ?most recently. ?01/06/2020 upon evaluation today patient actually appears to be doing much better in regard to her wound. We have switched back to the silver ?alginate after her home health nurse contacted me and honestly I feel like she is doing quite well in that regard. She does have a lot of  drainage ?and therefore I think that is probably a better option for. With that being said she is tolerating the compression wrap without complication that ?also seems to be helping. ?01/28/2020 on evaluati

## 2021-08-03 NOTE — Progress Notes (Signed)
?Subjective:  ?Patient ID: [name], female    DOB: 1929/08/07,  MRN: [id number] ? ?Chief Complaint  ?Patient presents with  ? Nail Problem  ?  Nail trim   ? ? ?88 y.o. female presents with the above complaint.  Patient presents with recurrence of right lateral fifth MPJ wound.  Patient states that it started back up again and came out of nowhere.  She wanted to get it evaluated.  Last time she ended up at the wound care center for which she would like to be referred back again to the wound care center to help healing.  She denies any other acute complaints. ? ? ?Review of Systems: Negative except as noted in the HPI. Denies N/V/F/Ch. ? ?Past Medical History:  ?Diagnosis Date  ? Abdominal aortic atherosclerosis ([place]) 09/2015  ? by xray  ? Anxiety   ? BCC (basal cell carcinoma of skin) 05/2011  ? on nose  ? BCC (basal cell carcinoma of skin) 08/09/2020  ? upper chest, [facility] 12/27/20  ? Breast lesion   ? a. diagnosed with complex sclerosing lesion with calcifications of the right breast in 08/2016  ? Chest pain   ? a. nuclear stress test 03/2012: normal; b. 09/2018 MV: EF >65%, no isch/scar.   ? DDD (degenerative disc disease), lumbosacral 12/06/1992  ? Gallstones 09/2015  ? incidentally by xray  ? GERD (gastroesophageal reflux disease) 05/1999  ? HERPES ZOSTER 04/13/2007  ? Qualifier: Diagnosis of  By: [name], [name]   ? Hiatal hernia   ? History of shingles   ? ophthalmic, takes acyclovir daily  ? Hyperlipidemia 12/2003  ? Hypertension 1990  ? Mitral regurgitation   ? a. echo 03/2012: EF 60-65%, no RWMA, mild to mod AI/MR, mod TR, PASP 37 mmHg  ? Osteoarthritis 08/21/1989  ? Osteoporosis with fracture 11/1997  ? with compression fracture T12  ? Persistent atrial fibrillation ([place]) 03/16/2012  ? a. CHADS2VASc => 5 (HTN, age x 2, vascular disease, sex category)-->Eliquis 5 BID;  b. Recurrent AF in 06/2016;  c. 01/2017 s/p DCCV;  d. 02/2017 recurrent AFib->Amio added->DCCV; e. 03/2017 recurrent AFib,  s/p reload of amio and DCCV 04/02/2017  ? POSTHERPETIC NEURALGIA 04/20/2007  ? Qualifier: Diagnosis of  By: [name], [name]   ? Rheumatoid arthritis ([place])   ? RLS (restless legs syndrome)   ? SCC (squamous cell carcinoma) 05/01/2021  ? right posterior flank inferior to axilla - well differentiated squamous cell carcinoma with superificial infiltration, base involved. [facility] 06/06/2021  ? ? ?Current Outpatient Medications:  ?  acetaminophen (TYLENOL) 500 MG tablet, Take 500 mg by mouth every 6 (six) hours as needed for mild pain., Disp: , Rfl:  ?  acyclovir (ZOVIRAX) 400 MG tablet, Take 400 mg by mouth 2 (two) times daily., Disp: , Rfl:  ?  Alpha-Lipoic Acid 600 MG TABS, Take 600 mg by mouth daily., Disp: , Rfl:  ?  amLODipine (NORVASC) 2.5 MG tablet, Take 1 tablet (2.5 mg total) by mouth daily., Disp: 90 tablet, Rfl: 1 ?  apixaban (ELIQUIS) 2.5 MG TABS tablet, Take 1 tablet (2.5 mg total) by mouth 2 (two) times daily., Disp: 180 tablet, Rfl: 1 ?  atorvastatin (LIPITOR) 20 MG tablet, TAKE 1 TABLET BY MOUTH ONCE DAILY, Disp: 90 tablet, Rfl: 3 ?  Docusate Calcium (STOOL SOFTENER PO), Take 1 capsule by mouth daily., Disp: , Rfl:  ?  doxycycline (VIBRA-TABS) 100 MG tablet, Take 1 tablet (100 mg total) by mouth 2 (  two) times daily for 7 days., Disp: 14 tablet, Rfl: 0 ?  DULoxetine (CYMBALTA) 60 MG capsule, TAKE 1 CAPSULE BY MOUTH ONCE DAILY, Disp: 90 capsule, Rfl: 1 ?  furosemide (LASIX) 20 MG tablet, Take 1 tablet (20 mg total) by mouth daily., Disp: 90 tablet, Rfl: 3 ?  gabapentin (NEURONTIN) 400 MG capsule, TAKE 1 CAPSULE BY MOUTH AT BEDTIME AND SECOND DOSE DURING THE DAY AS NEEDED, Disp: 150 capsule, Rfl: 1 ?  hydrocortisone (ANUSOL-HC) 2.5 % rectal cream, Place 1 application rectally daily as needed for hemorrhoids or anal itching. For hemmorhoid, Disp: 30 g, Rfl: 0 ?  lisinopril (ZESTRIL) 20 MG tablet, TAKE 1 TABLET BY MOUTH ONCE DAILY, Disp: 30 tablet, Rfl: 1 ?  memantine (NAMENDA) 5 MG tablet, TAKE 1  TABLET BY MOUTH TWICE DAILY, Disp: 60 tablet, Rfl: 1 ?  metoprolol succinate (TOPROL-XL) 50 MG 24 hr tablet, Take 2 tablets (100 mg) in the morning and 1 tablet (50 mg) in the evening, Disp: 270 tablet, Rfl: 1 ?  pantoprazole (PROTONIX) 40 MG tablet, TAKE 1 TABLET BY MOUTH ONCE DAILY, Disp: 90 tablet, Rfl: 1 ?  polyethylene glycol (MIRALAX / GLYCOLAX) 17 g packet, Take 17 g by mouth daily as needed for mild constipation., Disp: 14 each, Rfl: 0 ?  prednisoLONE acetate (PRED FORTE) 1 % ophthalmic suspension, Place 1 drop into the left eye daily. , Disp: , Rfl:  ?  rOPINIRole (REQUIP) 1 MG tablet, TAKE 2 AND 1/2 TABLETS BY MOUTH AT BEDTIME, Disp: 225 tablet, Rfl: 0 ?  tiZANidine (ZANAFLEX) 2 MG tablet, Take 2 mg by mouth daily., Disp: , Rfl:  ?  traMADol (ULTRAM) 50 MG tablet, Take 50-100 mg by mouth at bedtime as needed., Disp: , Rfl:  ?  vitamin B-12 (CYANOCOBALAMIN) 1000 MCG tablet, Take 1,000 mcg by mouth daily., Disp: , Rfl:  ?  Vitamin D, Ergocalciferol, (DRISDOL) 1.25 MG (50000 UNIT) CAPS capsule, TAKE 1 CAPSULE BY MOUTH ONCE WEEKLY (EVERY 7 DAYS), Disp: 12 capsule, Rfl: 1 ? ?Social History  ? ?Tobacco Use  ?Smoking Status Former  ? Packs/day: 0.25  ? Years: 1.00  ? Pack years: 0.25  ? Types: Cigarettes  ? Quit date: 04/08/1976  ? Years since quitting: 45.3  ?Smokeless Tobacco Never  ?Tobacco Comments  ? pt smokes occasionally "socially"  ? ? ?Allergies  ?Allergen Reactions  ? Penicillins Rash and Other (See Comments)  ?  Childhood Allergy ?Has patient had a PCN reaction causing immediate rash, facial/tongue/throat swelling, SOB or lightheadedness with hypotension: Yes ?Has patient had a PCN reaction causing severe rash involving mucus membranes or skin necrosis: Unknown ?Has patient had a PCN reaction that required hospitalization: No ?Has patient had a PCN reaction occurring within the last 10 years: No ?If all of the above answers are "NO", then may proceed with Cephalosporin use. ?  ? ?Objective:  ?There were  no vitals filed for this visit. ?There is no height or weight on file to calculate BMI. ?Constitutional Well developed. ?Well nourished.  ?Vascular Dorsalis pedis pulses palpable bilaterally. ?Posterior tibial pulses palpable bilaterally. ?Capillary refill normal to all digits.  ?No cyanosis or clubbing noted. ?Pedal hair growth normal.  ?Neurologic Normal speech. ?Oriented to person, place, and time. ?Epicritic sensation to light touch grossly present bilaterally.  ?Dermatologic Right fifth metatarsal phalangeal joint lateral wound.  Probing down to subcutaneous tissue.  No redness noted.  No erythema noted.  No malodor present no purulent drainage noted.  ?Orthopedic: Normal joint ROM  without pain or crepitus bilaterally. ?No visible deformities. ?No bony tenderness.  ? ?Radiographs: None ?Assessment:  ? ?1. Ulcerated, foot, right, with fat layer exposed ([place])   ? ?Plan:  ?Patient was evaluated and treated and all questions answered. ? ?Right lateral fifth metatarsal MPJ wound ?-All questions and concerns were discussed with the patient in extensive detail ?-Minimally excisionally debrided the wound.  No purulent drainage noted.  Clinically it is not infected.  Given the recurrence of the wound I will believe she will benefit from referral to the wound care center to allow it to heal back up again. ?-Betadine wet-to-dry dressing change daily ?-Place herself back in the surgical shoe to offload the fifth MPJ wound. ?-I will place a referral into the [place] wound care center. ? ?No follow-ups on file. ?

## 2021-08-06 ENCOUNTER — Other Ambulatory Visit: Payer: Self-pay | Admitting: Family Medicine

## 2021-08-06 NOTE — Telephone Encounter (Signed)
Refill request Requip ?Last refill 05/17/21 #225 ?Last office visit 05/30/21 acute ? ?

## 2021-08-06 NOTE — Telephone Encounter (Signed)
Dr Darnell Level, I wanted to make sure it is ok for patient to proceed with Prolia injection at this time. Patient is in palliative care. ?I have not contacted the daughter yet. ?

## 2021-08-07 NOTE — Telephone Encounter (Signed)
Ok to proceed if pt interested.  ?

## 2021-08-07 NOTE — Telephone Encounter (Signed)
Spoke with [name] and discussed this. She will discuss with patient and palliative care and I will follow up in about a week. Patient is doing well at this time with her health. ?

## 2021-08-09 ENCOUNTER — Ambulatory Visit: Payer: Medicare Other | Admitting: Podiatry

## 2021-08-13 ENCOUNTER — Telehealth: Payer: Self-pay | Admitting: Cardiovascular Disease

## 2021-08-13 ENCOUNTER — Other Ambulatory Visit: Payer: Self-pay | Admitting: Family Medicine

## 2021-08-13 DIAGNOSIS — F331 Major depressive disorder, recurrent, moderate: Secondary | ICD-10-CM

## 2021-08-13 DIAGNOSIS — K219 Gastro-esophageal reflux disease without esophagitis: Secondary | ICD-10-CM

## 2021-08-13 NOTE — Telephone Encounter (Signed)
Spoke with the patients daughter [name]. ?Rescheduled the pt appt for 09/04/21 @ 3:20 pm with Dr. Fletcher Anon. ?[name] aware of the appt date and time. [name] voiced appreciation for the assistance. ?

## 2021-08-13 NOTE — Telephone Encounter (Signed)
Patient's daughter wants to know if it's okay for patient to wait until July 13th  to be seen by Dr. Fletcher Anon  or does she need to see a PA before that.  Daughter states her mother is doing fine at this time. Her appt was orginially schedule for 5/16, but daughter can't bring her in as she will be out of town that day. Please advise.  ?

## 2021-08-14 ENCOUNTER — Ambulatory Visit: Payer: Medicare Other | Admitting: Physician Assistant

## 2021-08-15 NOTE — Telephone Encounter (Signed)
Namenda ?Last filled:  07/09/21 #60 ?Last OV:  01/30/21, f/u ?Next OV:  none ? ?Cymbalta last filled:  05/15/21, #90 ?Protonix last filled:  05/28/21, #90 ?

## 2021-08-16 NOTE — Telephone Encounter (Signed)
ERx 

## 2021-08-21 ENCOUNTER — Ambulatory Visit: Payer: Medicare Other | Admitting: Cardiovascular Disease

## 2021-08-27 ENCOUNTER — Other Ambulatory Visit: Payer: Self-pay

## 2021-08-27 ENCOUNTER — Ambulatory Visit (INDEPENDENT_AMBULATORY_CARE_PROVIDER_SITE_OTHER): Payer: Medicare Other

## 2021-08-27 DIAGNOSIS — Z0000 Encounter for general adult medical examination without abnormal findings: Secondary | ICD-10-CM

## 2021-08-27 NOTE — Progress Notes (Signed)
Virtual Visit via Telephone Note  I connected with  [name] on 08/27/21 at  1:00 PM EDT by telephone and verified that I am speaking with the correct person using two identifiers. Talked with her daughter [name]  Location: Patient: home Provider: [place] Persons participating in the virtual visit: patient/Nurse Health Advisor   I discussed the limitations, risks, security and privacy concerns of performing an evaluation and management service by telephone and the availability of in person appointments. The patient expressed understanding and agreed to proceed.  Interactive audio and video telecommunications were attempted between this nurse and patient, however failed, due to patient having technical difficulties OR patient did not have access to video capability.  We continued and completed visit with audio only.  Some vital signs may be absent or patient reported.   Crystal David, LPN  Subjective:   [name] is a 88 y.o. female who presents for Medicare Annual (Subsequent) preventive examination.  Review of Systems           Objective:    There were no vitals filed for this visit. There is no height or weight on file to calculate BMI.     10/30/2020    8:34 AM 10/25/2020    9:34 PM 04/02/2020    4:33 AM 10/25/2019    2:29 AM 10/23/2019   12:01 PM 08/02/2019    7:11 AM 07/06/2019    2:12 PM  Advanced Directives  Does Patient Have a Medical Advance Directive? Yes Yes No No Yes Yes No  Type of Paramedic of [place];Living will [place];Living will   [place];Living will Living will   Does patient want to make changes to medical advance directive?  No - Patient declined  No - Patient declined No - Patient declined No - Patient declined   Copy of [place] in Chart?  No - copy requested   No - copy requested    Would patient like information on creating a medical  advance directive?  No - Patient declined No - Patient declined No - Guardian declined   No - Patient declined    Current Medications (verified) Outpatient Encounter Medications as of 08/27/2021  Medication Sig   acetaminophen (TYLENOL) 500 MG tablet Take 500 mg by mouth every 6 (six) hours as needed for mild pain.   acyclovir (ZOVIRAX) 400 MG tablet Take 400 mg by mouth 2 (two) times daily.   Alpha-Lipoic Acid 600 MG TABS Take 600 mg by mouth daily.   amLODipine (NORVASC) 2.5 MG tablet Take 1 tablet (2.5 mg total) by mouth daily.   apixaban (ELIQUIS) 2.5 MG TABS tablet Take 1 tablet (2.5 mg total) by mouth 2 (two) times daily.   atorvastatin (LIPITOR) 20 MG tablet TAKE 1 TABLET BY MOUTH ONCE DAILY   Docusate Calcium (STOOL SOFTENER PO) Take 1 capsule by mouth daily.   DULoxetine (CYMBALTA) 60 MG capsule TAKE 1 CAPSULE BY MOUTH ONCE DAILY   furosemide (LASIX) 20 MG tablet Take 1 tablet (20 mg total) by mouth daily.   gabapentin (NEURONTIN) 400 MG capsule TAKE 1 CAPSULE BY MOUTH AT BEDTIME AND SECOND DOSE DURING THE DAY AS NEEDED   hydrocortisone (ANUSOL-HC) 2.5 % rectal cream Place 1 application rectally daily as needed for hemorrhoids or anal itching. For hemmorhoid   lisinopril (ZESTRIL) 20 MG tablet TAKE 1 TABLET BY MOUTH ONCE DAILY   loratadine (CLARITIN) 10 MG tablet Take 10  mg by mouth daily as needed.   memantine (NAMENDA) 5 MG tablet TAKE 1 TABLET BY MOUTH TWICE DAILY   metoprolol succinate (TOPROL-XL) 50 MG 24 hr tablet Take 2 tablets (100 mg) in the morning and 1 tablet (50 mg) in the evening   pantoprazole (PROTONIX) 40 MG tablet TAKE 1 TABLET BY MOUTH ONCE DAILY   polyethylene glycol (MIRALAX / GLYCOLAX) 17 g packet Take 17 g by mouth daily as needed for mild constipation.   prednisoLONE acetate (PRED FORTE) 1 % ophthalmic suspension Place 1 drop into the left eye daily.    rOPINIRole (REQUIP) 1 MG tablet TAKE 2 AND 1/2 TABLETS BY MOUTH AT BEDTIME   tiZANidine (ZANAFLEX) 2 MG  tablet Take 2 mg by mouth daily.   traMADol (ULTRAM) 50 MG tablet Take 50-100 mg by mouth at bedtime as needed.   vitamin B-12 (CYANOCOBALAMIN) 1000 MCG tablet Take 1,000 mcg by mouth daily.   Vitamin D, Ergocalciferol, (DRISDOL) 1.25 MG (50000 UNIT) CAPS capsule TAKE 1 CAPSULE BY MOUTH ONCE WEEKLY (EVERY 7 DAYS)   No facility-administered encounter medications on file as of 08/27/2021.    Allergies (verified) Penicillins   History: Past Medical History:  Diagnosis Date   Abdominal aortic atherosclerosis ([place]) 09/2015   by xray   Anxiety    BCC (basal cell carcinoma of skin) 05/2011   on nose   BCC (basal cell carcinoma of skin) 08/09/2020   upper chest, [facility] 12/27/20   Breast lesion    a. diagnosed with complex sclerosing lesion with calcifications of the right breast in 08/2016   Chest pain    a. nuclear stress test 03/2012: normal; b. 09/2018 MV: EF >65%, no isch/scar.    DDD (degenerative disc disease), lumbosacral 12/06/1992   Gallstones 09/2015   incidentally by xray   GERD (gastroesophageal reflux disease) 05/1999   HERPES ZOSTER 04/13/2007   Qualifier: Diagnosis of  By: [name], [name]    Hiatal hernia    History of shingles    ophthalmic, takes acyclovir daily   Hyperlipidemia 12/2003   Hypertension 1990   Mitral regurgitation    a. echo 03/2012: EF 60-65%, no RWMA, mild to mod AI/MR, mod TR, PASP 37 mmHg   Osteoarthritis 08/21/1989   Osteoporosis with fracture 11/1997   with compression fracture T12   Persistent atrial fibrillation ([place]) 03/16/2012   a. CHADS2VASc => 5 (HTN, age x 2, vascular disease, sex category)-->Eliquis 5 BID;  b. Recurrent AF in 06/2016;  c. 01/2017 s/p DCCV;  d. 02/2017 recurrent AFib->Amio added->DCCV; e. 03/2017 recurrent AFib, s/p reload of amio and DCCV 04/02/2017   POSTHERPETIC NEURALGIA 04/20/2007   Qualifier: Diagnosis of  By: [name], Billie-Lynn Daniels    Rheumatoid arthritis ([place])    RLS (restless legs syndrome)     SCC (squamous cell carcinoma) 05/01/2021   right posterior flank inferior to axilla - well differentiated squamous cell carcinoma with superificial infiltration, base involved. [facility] 06/06/2021   Past Surgical History:  Procedure Laterality Date   ABDOMINAL HYSTERECTOMY  Age 63 - 89   S/P TAH   abdominal ultrasound  04/23/2004   Gallstones   BREAST EXCISIONAL BIOPSY Right 2018   neg/benign complex sclerosing lesion   BREAST LUMPECTOMY WITH RADIOACTIVE SEED LOCALIZATION Right 10/2016   breast lumpectomy with radioactive seed localization and margin assessment for complex sclerosing lesion Renelda Loma)   BREAST LUMPECTOMY WITH RADIOACTIVE SEED LOCALIZATION Right 11/05/2016   Procedure: RIGHT BREAST LUMPECTOMY WITH RADIOACTIVE SEED LOCALIZATION;  Surgeon:  Fanny Skates, MD;  Location: [place];  Service: General;  Laterality: Right;   CARDIAC CATHETERIZATION     CARDIOVERSION N/A 01/31/2017   Procedure: CARDIOVERSION;  Surgeon: Wellington Hampshire, MD;  Location: [facility];  Service: Cardiovascular;  Laterality: N/A;   CARDIOVERSION N/A 03/03/2017   Procedure: CARDIOVERSION;  Surgeon: Wellington Hampshire, MD;  Location: [facility];  Service: Cardiovascular;  Laterality: N/A;   CARDIOVERSION N/A 04/02/2017   Procedure: CARDIOVERSION;  Surgeon: Wellington Hampshire, MD;  Location: [facility];  Service: Cardiovascular;  Laterality: N/A;   CARDIOVERSION N/A 01/16/2018   Procedure: CARDIOVERSION;  Surgeon: Wellington Hampshire, MD;  Location: [facility];  Service: Cardiovascular;  Laterality: N/A;   CARDIOVERSION N/A 12/25/2020   Procedure: CARDIOVERSION;  Surgeon: Wellington Hampshire, MD;  Location: [facility];  Service: Cardiovascular;  Laterality: N/A;   CATARACT EXTRACTION  05/2010   left eye   CERVICAL DISCECTOMY  1992   Fusion (Dr. Joya Salm)   ERCP N/A 10/30/2020   Procedure: ENDOSCOPIC RETROGRADE CHOLANGIOPANCREATOGRAPHY (ERCP);  Surgeon: Jackquline Denmark, MD;  Location: [facility] END[facility];  Service:  Endoscopy;  Laterality: N/A;   ESI Bilateral 01/2016   S1 transforaminal ESI x3   ESOPHAGOGASTRODUODENOSCOPY  01/01/2002   with ulcer, bx. neg; + stricture gastric ulcer with hemorrhage   ESOPHAGOGASTRODUODENOSCOPY  04/23/2004   Esophageal stricture -- dilated   INTRAVASCULAR PRESSURE WIRE/FFR STUDY N/A 07/12/2019   Procedure: INTRAVASCULAR PRESSURE WIRE/FFR STUDY;  Surgeon: Wellington Hampshire, MD;  Location: [place] [facility];  Service: Cardiovascular;  Laterality: N/A;   LOWER EXTREMITY ANGIOGRAPHY Left 02/01/2019   LOWER EXTREMITY ANGIOGRAPHY;  Surgeon: Algernon Huxley, MD   LOWER EXTREMITY ANGIOGRAPHY Right 03/25/2019   Procedure: LOWER EXTREMITY ANGIOGRAPHY;  Surgeon: Algernon Huxley, MD;  Location: [place] [facility];  Service: Cardiovascular;  Laterality: Right;   LOWER EXTREMITY ANGIOGRAPHY Right 08/02/2019   Procedure: LOWER EXTREMITY ANGIOGRAPHY;  Surgeon: Algernon Huxley, MD;  Location: [place] [facility];  Service: Cardiovascular;  Laterality: Right;   nuclear stress test  03/2012   no ischemia   PERIPHERAL VASCULAR BALLOON ANGIOPLASTY Left 01/2019   Percutaneous transluminal angioplasty of left anterior and posterior tibial artery with 2.5 mm diameter by 22 cm length angioplasty balloon ([place])   REMOVAL OF STONES  10/30/2020   Procedure: REMOVAL OF STONES;  Surgeon: Jackquline Denmark, MD;  Location: [facility] ENDOSCOPY;  Service: Endoscopy;;   RIGHT/LEFT HEART CATH AND CORONARY ANGIOGRAPHY Bilateral 07/12/2019   Procedure: RIGHT/LEFT HEART CATH AND CORONARY ANGIOGRAPHY;  Surgeon: Wellington Hampshire, MD;  Location: [place] [facility];  Service: Cardiovascular;  Laterality: Bilateral;   SKIN CANCER EXCISION  05/20/2011   BCC from nose   SPHINCTEROTOMY  10/30/2020   Procedure: SPHINCTEROTOMY;  Surgeon: Jackquline Denmark, MD;  Location: [facility] ENDOSCOPY;  Service: Endoscopy;;   US ECHOCARDIOGRAPHY  03/2012   in flutter, EF 60%, mild-mod aortic, mitral, tricuspid regurg, mildly dilated LA   Family History   Problem Relation Age of Onset   Emphysema Mother    Heart disease Father        MI   Stroke Father        first at age 34.   Stroke Sister    Diabetes Sister    Hypertension Brother    Heart disease Brother        Heart Valve   Stroke Brother    Diabetes Brother    Cancer Brother        esophageal  Diabetes Brother    Diabetes Brother    Colon cancer Neg Hx    Social History   Socioeconomic History   Marital status: Widowed    Spouse name: Not on file   Number of children: 5   Years of education: Not on file   Highest education level: Not on file  Occupational History   Occupation: Retired - Occupational psychologist: RETIRED  Tobacco Use   Smoking status: Former    Packs/day: 0.25    Years: 1.00    Pack years: 0.25    Types: Cigarettes    Quit date: 04/08/1976    Years since quitting: 45.4   Smokeless tobacco: Never   Tobacco comments:    pt smokes occasionally "socially"  Vaping Use   Vaping Use: Never used  Substance and Sexual Activity   Alcohol use: No    Alcohol/week: 1.0 standard drink    Types: 1 Glasses of wine per week   Drug use: No   Sexual activity: Never  Other Topics Concern   Not on file  Social History Narrative   Lives with grandson who smokes, dog   From [place].  Widowed 01/1999; 1st marriage 25 years / 2nd marriage 13 years.   One son deceased 2023/01/18.   Activity: active with dog   Diet: good water, fruits/vegetables daily   Social Determinants of Health   Financial Resource Strain: Low Risk    Difficulty of Paying Living Expenses: Not hard at all  Food Insecurity: No Food Insecurity   Worried About Charity fundraiser in the Last Year: Never true   Arboriculturist in the Last Year: Never true  Transportation Needs: Not on file  Physical Activity: Not on file  Stress: Not on file  Social Connections: Not on file    Tobacco Counseling Counseling given: Not Answered Tobacco comments: pt smokes occasionally "socially"   Clinical  Intake:  Pre-visit preparation completed: Yes  Pain : No/denies pain     Diabetes: No  How often do you need to have someone help you when you read instructions, pamphlets, or other written materials from your doctor or pharmacy?: 1 - Never  Diabetic?no  Interpreter Needed?: No  Information entered by :: Kirke Shaggy, LPN   Activities of Daily Living    12/25/2020    7:32 AM 10/25/2020    9:34 PM  In your present state of health, do you have any difficulty performing the following activities:  Hearing? 0 1  Vision? 0 1  Difficulty concentrating or making decisions? 0 0  Walking or climbing stairs? 0 1  Dressing or bathing? 0 1  Doing errands, shopping?  1    Patient Care Team: Ria Bush, MD as PCP - General (Family Medicine) Wellington Hampshire, MD as Consulting Physician (Cardiology) Leandrew Koyanagi, MD as Referring Physician (Ophthalmology) Sharlet Salina, MD as Referring Physician (Physical Medicine and Rehabilitation) Arvil Chaco, PA-C (Inactive) as Physician Assistant (Cardiology) Charlton Haws, [facility] as Pharmacist (Pharmacist)  Indicate any recent Medical Services you may have received from other than [facility] providers in the past year (date may be approximate).     Assessment:   This is a routine wellness examination for Josceline.  Hearing/Vision screen No results found.  Dietary issues and exercise activities discussed:     Goals Addressed   None    Depression Screen    01/30/2021    3:30 PM 06/21/2020    3:06 PM 04/28/2019  12:18 PM 04/22/2018   12:44 PM 05/28/2016   12:03 PM 12/08/2014   10:45 AM 04/14/2012   11:04 AM  PHQ 2/9 Scores  PHQ - 2 Score 2 4 0 0 0 0 0  PHQ- 9 Score 3 15 0 0       Fall Risk    06/21/2020    2:11 PM 04/28/2019   12:13 PM 04/22/2018   12:44 PM 11/11/2016    3:41 PM 05/28/2016   12:03 PM  [place] in the past year? '1 1 1 '$ No Yes  Comment  Balance issues   pt reports multiple falls while  gardening or doing other yard work  Number falls in past yr: '1 1 1  2 '$ or more  Injury with Fall? 1 0 1  No  Comment Left arm abrasion      Risk for fall due to :  Medication side effect;History of fall(s);Impaired balance/gait Impaired balance/gait;Impaired mobility;History of fall(s)    Follow up  Falls evaluation completed;Falls prevention discussed       FALL RISK PREVENTION PERTAINING TO THE HOME:  Any stairs in or around the home? No  If so, are there any without handrails? No  Home free of loose throw rugs in walkways, pet beds, electrical cords, etc? Yes  Adequate lighting in your home to reduce risk of falls? Yes   ASSISTIVE DEVICES UTILIZED TO PREVENT FALLS:  Life alert? Yes  Use of a cane, walker or w/c? Yes  Grab bars in the bathroom? Yes  Shower chair or bench in shower? Yes  Elevated toilet seat or a handicapped toilet? No     Cognitive Function: pt unavailable       04/28/2019   12:23 PM 04/22/2018    3:43 PM 05/28/2016   12:13 PM  MMSE - Mini Mental State Exam  Orientation to time '5 5 5  '$ Orientation to Place '5 5 5  '$ Registration '3 3 3  '$ Attention/ Calculation 5 0 0  Recall '3 3 3  '$ Language- name 2 objects  0 0  Language- repeat 0 1 1  Language- follow 3 step command  3 3  Language- read & follow direction  0 0  Write a sentence  0 0  Copy design  0 0  Total score  20 20        Immunizations Immunization History  Administered Date(s) Administered   Fluad Quad(high Dose 65+) 01/19/2020, 01/23/2021   Influenza Split 03/20/2011   Influenza, High Dose Seasonal PF 02/16/2019   Influenza,inj,Quad PF,6+ Mos 12/08/2014, 12/04/2015, 01/07/2017, 01/05/2018   Influenza-Unspecified 03/03/2013   PFIZER(Purple Top)SARS-COV-2 Vaccination 06/01/2019, 06/22/2019   Pneumococcal Conjugate-13 01/18/2015   Pneumococcal Polysaccharide-23 12/08/2003, 06/21/2020   Td 12/08/2003    TDAP status: Due, Education has been provided regarding the importance of this vaccine.  Advised may receive this vaccine at local pharmacy or Health Dept. Aware to provide a copy of the vaccination record if obtained from local pharmacy or Health Dept. Verbalized acceptance and understanding.  Flu Vaccine status: Up to date  Pneumococcal vaccine status: Up to date  Covid-19 vaccine status: Completed vaccines  Qualifies for Shingles Vaccine? Yes   Zostavax completed No   Shingrix Completed?: No.    Education has been provided regarding the importance of this vaccine. Patient has been advised to call insurance company to determine out of pocket expense if they have not yet received this vaccine. Advised may also receive vaccine at local pharmacy or Health  Dept. Verbalized acceptance and understanding.  Screening Tests Health Maintenance  Topic Date Due   Zoster Vaccines- Shingrix (1 of 2) Never done   COVID-19 Vaccine (3 - Pfizer risk series) 07/20/2019   MAMMOGRAM  06/07/2021   TETANUS/TDAP  12/07/2023 (Originally 12/07/2013)   INFLUENZA VACCINE  11/06/2021   Pneumonia Vaccine 29+ Years old  Completed   DEXA SCAN  Completed   HPV VACCINES  Aged Out    Health Maintenance  Health Maintenance Due  Topic Date Due   Zoster Vaccines- Shingrix (1 of 2) Never done   COVID-19 Vaccine (3 - Pfizer risk series) 07/20/2019   MAMMOGRAM  06/07/2021    Colorectal cancer screening: No longer required.   Mammogram status: No longer required due to age.  Aged out BDS  Lung Cancer Screening: (Low Dose CT Chest recommended if Age 24-80 years, 30 pack-year currently smoking OR have quit w/in 15years.) does not qualify.    Additional Screening:  Hepatitis C Screening: does not qualify; Completed no  Vision Screening: Recommended annual ophthalmology exams for early detection of glaucoma and other disorders of the eye. Is the patient up to date with their annual eye exam?  Yes  Who is the provider or what is the name of the office in which the patient attends annual eye exams?  [facility] If pt is not established with a provider, would they like to be referred to a provider to establish care? No .   Dental Screening: Recommended annual dental exams for proper oral hygiene  Community Resource Referral / Chronic Care Management: CRR required this visit?  No   CCM required this visit?  No      Plan:     I have personally reviewed and noted the following in the patient's chart:   Medical and social history Use of alcohol, tobacco or illicit drugs  Current medications and supplements including opioid prescriptions.  Functional ability and status Nutritional status Physical activity Advanced directives List of other physicians Hospitalizations, surgeries, and ER visits in previous 12 months Vitals Screenings to include cognitive, depression, and falls Referrals and appointments  In addition, I have reviewed and discussed with patient certain preventive protocols, quality metrics, and best practice recommendations. A written personalized care plan for preventive services as well as general preventive health recommendations were provided to patient.     Crystal David, LPN   04/10/7251   Nurse Notes: none

## 2021-08-27 NOTE — Patient Instructions (Signed)
[name] , Thank you for taking time to come for your Medicare Wellness Visit. I appreciate your ongoing commitment to your health goals. Please review the following plan we discussed and let me know if I can assist you in the future.   Screening recommendations/referrals: Colonoscopy: aged out Mammogram: aged out Bone Density: aged out Recommended yearly ophthalmology/optometry visit for glaucoma screening and checkup Recommended yearly dental visit for hygiene and checkup  Vaccinations: Influenza vaccine: 01/23/21 Pneumococcal vaccine: 06/21/20 Tdap vaccine: 12/08/03, due Shingles vaccine: n/d   Covid-19:06/01/19, 06/22/19  Advanced directives: yes  Conditions/risks identified: none  Next appointment: Follow up in one year for your annual wellness visit - declined   Preventive Care 75 Years and Older, Female Preventive care refers to lifestyle choices and visits with your health care provider that can promote health and wellness. What does preventive care include? A yearly physical exam. This is also called an annual well check. Dental exams once or twice a year. Routine eye exams. Ask your health care provider how often you should have your eyes checked. Personal lifestyle choices, including: Daily care of your teeth and gums. Regular physical activity. Eating a healthy diet. Avoiding tobacco and drug use. Limiting alcohol use. Practicing safe sex. Taking low-dose aspirin every day. Taking vitamin and mineral supplements as recommended by your health care provider. What happens during an annual well check? The services and screenings done by your health care provider during your annual well check will depend on your age, overall health, lifestyle risk factors, and family history of disease. Counseling  Your health care provider may ask you questions about your: Alcohol use. Tobacco use. Drug use. Emotional well-being. Home and relationship well-being. Sexual  activity. Eating habits. History of falls. Memory and ability to understand (cognition). Work and work Statistician. Reproductive health. Screening  You may have the following tests or measurements: Height, weight, and BMI. Blood pressure. Lipid and cholesterol levels. These may be checked every 5 years, or more frequently if you are over 85 years old. Skin check. Lung cancer screening. You may have this screening every year starting at age 39 if you have a 30-pack-year history of smoking and currently smoke or have quit within the past 15 years. Fecal occult blood test (FOBT) of the stool. You may have this test every year starting at age 73. Flexible sigmoidoscopy or colonoscopy. You may have a sigmoidoscopy every 5 years or a colonoscopy every 10 years starting at age 68. Hepatitis C blood test. Hepatitis B blood test. Sexually transmitted disease (STD) testing. Diabetes screening. This is done by checking your blood sugar (glucose) after you have not eaten for a while (fasting). You may have this done every 1-3 years. Bone density scan. This is done to screen for osteoporosis. You may have this done starting at age 74. Mammogram. This may be done every 1-2 years. Talk to your health care provider about how often you should have regular mammograms. Talk with your health care provider about your test results, treatment options, and if necessary, the need for more tests. Vaccines  Your health care provider may recommend certain vaccines, such as: Influenza vaccine. This is recommended every year. Tetanus, diphtheria, and acellular pertussis (Tdap, Td) vaccine. You may need a Td booster every 10 years. Zoster vaccine. You may need this after age 70. Pneumococcal 13-valent conjugate (PCV13) vaccine. One dose is recommended after age 59. Pneumococcal polysaccharide (PPSV23) vaccine. One dose is recommended after age 78. Talk to your health care provider  about which screenings and vaccines  you need and how often you need them. This information is not intended to replace advice given to you by your health care provider. Make sure you discuss any questions you have with your health care provider. Document Released: 04/21/2015 Document Revised: 12/13/2015 Document Reviewed: 01/24/2015 Elsevier Interactive Patient Education  2017 [place] Prevention in the Home Falls can cause injuries. They can happen to people of all ages. There are many things you can do to make your home safe and to help prevent falls. What can I do on the outside of my home? Regularly fix the edges of walkways and driveways and fix any cracks. Remove anything that might make you trip as you walk through a door, such as a raised step or threshold. Trim any bushes or trees on the path to your home. Use bright outdoor lighting. Clear any walking paths of anything that might make someone trip, such as rocks or tools. Regularly check to see if handrails are loose or broken. Make sure that both sides of any steps have handrails. Any raised decks and porches should have guardrails on the edges. Have any leaves, snow, or ice cleared regularly. Use sand or salt on walking paths during winter. Clean up any spills in your garage right away. This includes oil or grease spills. What can I do in the bathroom? Use night lights. Install grab bars by the toilet and in the tub and shower. Do not use towel bars as grab bars. Use non-skid mats or decals in the tub or shower. If you need to sit down in the shower, use a plastic, non-slip stool. Keep the floor dry. Clean up any water that spills on the floor as soon as it happens. Remove soap buildup in the tub or shower regularly. Attach bath mats securely with double-sided non-slip rug tape. Do not have throw rugs and other things on the floor that can make you trip. What can I do in the bedroom? Use night lights. Make sure that you have a light by your bed that  is easy to reach. Do not use any sheets or blankets that are too big for your bed. They should not hang down onto the floor. Have a firm chair that has side arms. You can use this for support while you get dressed. Do not have throw rugs and other things on the floor that can make you trip. What can I do in the kitchen? Clean up any spills right away. Avoid walking on wet floors. Keep items that you use a lot in easy-to-reach places. If you need to reach something above you, use a strong step stool that has a grab bar. Keep electrical cords out of the way. Do not use floor polish or wax that makes floors slippery. If you must use wax, use non-skid floor wax. Do not have throw rugs and other things on the floor that can make you trip. What can I do with my stairs? Do not leave any items on the stairs. Make sure that there are handrails on both sides of the stairs and use them. Fix handrails that are broken or loose. Make sure that handrails are as long as the stairways. Check any carpeting to make sure that it is firmly attached to the stairs. Fix any carpet that is loose or worn. Avoid having throw rugs at the top or bottom of the stairs. If you do have throw rugs, attach them to the floor  with carpet tape. Make sure that you have a light switch at the top of the stairs and the bottom of the stairs. If you do not have them, ask someone to add them for you. What else can I do to help prevent falls? Wear shoes that: Do not have high heels. Have rubber bottoms. Are comfortable and fit you well. Are closed at the toe. Do not wear sandals. If you use a stepladder: Make sure that it is fully opened. Do not climb a closed stepladder. Make sure that both sides of the stepladder are locked into place. Ask someone to hold it for you, if possible. Clearly mark and make sure that you can see: Any grab bars or handrails. First and last steps. Where the edge of each step is. Use tools that help you  move around (mobility aids) if they are needed. These include: Canes. Walkers. Scooters. Crutches. Turn on the lights when you go into a dark area. Replace any light bulbs as soon as they burn out. Set up your furniture so you have a clear path. Avoid moving your furniture around. If any of your floors are uneven, fix them. If there are any pets around you, be aware of where they are. Review your medicines with your doctor. Some medicines can make you feel dizzy. This can increase your chance of falling. Ask your doctor what other things that you can do to help prevent falls. This information is not intended to replace advice given to you by your health care provider. Make sure you discuss any questions you have with your health care provider. Document Released: 01/19/2009 Document Revised: 08/31/2015 Document Reviewed: 04/29/2014 Elsevier Interactive Patient Education  2017 [employer].

## 2021-08-30 ENCOUNTER — Encounter: Payer: Self-pay | Admitting: Podiatry

## 2021-08-30 ENCOUNTER — Ambulatory Visit (INDEPENDENT_AMBULATORY_CARE_PROVIDER_SITE_OTHER): Payer: Medicare Other | Admitting: Podiatry

## 2021-08-30 ENCOUNTER — Telehealth: Payer: Self-pay

## 2021-08-30 ENCOUNTER — Other Ambulatory Visit (INDEPENDENT_AMBULATORY_CARE_PROVIDER_SITE_OTHER): Payer: Medicare Other

## 2021-08-30 DIAGNOSIS — L97512 Non-pressure chronic ulcer of other part of right foot with fat layer exposed: Secondary | ICD-10-CM

## 2021-08-30 DIAGNOSIS — M810 Age-related osteoporosis without current pathological fracture: Secondary | ICD-10-CM

## 2021-08-30 DIAGNOSIS — I739 Peripheral vascular disease, unspecified: Secondary | ICD-10-CM | POA: Diagnosis not present

## 2021-08-30 NOTE — Telephone Encounter (Signed)
I spoke with [name](DPR signed); Pt had taken her med and then care giver came in and gave med again; when [name] had spoken with pt the pt could not remember taking her med. [name] went to pts home later in the day and pt had already taken her bedtime med early. [name] spoke with poison control and after review poison control advised that pt had not exceeded maximum daily allowance for pts meds; pt was to be monitored and BP taken. [name] said BP was 132/80 P 80. Pt ate normally and slept well until had fire alarm and after all clear pt went back to bed and went to sleep again. Pt does not seem to have any problems today. Pt has a caregiver for several hours during the day and the care giver usually gives pt her meds. Advised might be good idea since care giver gives pts meds to move the meds so not accessible to pt since she may not remember taking med. [name] wanted Dr Darnell Level to know that pt has appt to see podiatrist today due to reddened area on foot again. [name] scheduled FU appt from the 01/30/2021 visit for 09/12/21 at 3 PM with Dr Francetta Found said pts memory seems to be worsening recently.offered sooner appt but did not work with [name]'s schedule. UC & ED precautions given and [name] voiced understanding. [name] would also like pt to get Prolia injection at the 09/12/21 visit. Will send note to Dr Valinda Hoar CMA and Azalee Course CMA about prolia injection.

## 2021-08-30 NOTE — Telephone Encounter (Signed)
Spoke with [name] to let her know we need to get labs done ahead of 09/12/21 appointment. They will come today. Orders placed

## 2021-08-30 NOTE — Addendum Note (Signed)
Addended by: Kris Mouton on: 08/30/2021 11:37 AM   Modules accepted: Orders

## 2021-08-30 NOTE — Progress Notes (Signed)
Subjective:  Patient ID: [name], female    DOB: May 03, 1929,  MRN: [id number]  Chief Complaint  Patient presents with   Foot Ulcer    88 y.o. female presents with the above complaint.  Patient presents with follow-up of right lateral fifth MPJ wound.  Patient states that she went to the wound care center at that time he was healed however it started opening back up again and there is some drainage coming out of it.  She wanted get evaluated she is not on any antibiotics.  She denies any other acute complaints.   Review of Systems: Negative except as noted in the HPI. Denies N/V/F/Ch.  Past Medical History:  Diagnosis Date   Abdominal aortic atherosclerosis ([place]) 09/2015   by xray   Anxiety    BCC (basal cell carcinoma of skin) 05/2011   on nose   BCC (basal cell carcinoma of skin) 08/09/2020   upper chest, [facility] 12/27/20   Breast lesion    a. diagnosed with complex sclerosing lesion with calcifications of the right breast in 08/2016   Chest pain    a. nuclear stress test 03/2012: normal; b. 09/2018 MV: EF >65%, no isch/scar.    DDD (degenerative disc disease), lumbosacral 12/06/1992   Gallstones 09/2015   incidentally by xray   GERD (gastroesophageal reflux disease) 05/1999   HERPES ZOSTER 04/13/2007   Qualifier: Diagnosis of  By: [name], [name]    Hiatal hernia    History of shingles    ophthalmic, takes acyclovir daily   Hyperlipidemia 12/2003   Hypertension 1990   Mitral regurgitation    a. echo 03/2012: EF 60-65%, no RWMA, mild to mod AI/MR, mod TR, PASP 37 mmHg   Osteoarthritis 08/21/1989   Osteoporosis with fracture 11/1997   with compression fracture T12   Persistent atrial fibrillation ([place]) 03/16/2012   a. CHADS2VASc => 5 (HTN, age x 2, vascular disease, sex category)-->Eliquis 5 BID;  b. Recurrent AF in 06/2016;  c. 01/2017 s/p DCCV;  d. 02/2017 recurrent AFib->Amio added->DCCV; e. 03/2017 recurrent AFib, s/p reload of amio and DCCV 04/02/2017    POSTHERPETIC NEURALGIA 04/20/2007   Qualifier: Diagnosis of  By: [name], Billie-Lynn Daniels    Rheumatoid arthritis ([place])    RLS (restless legs syndrome)    SCC (squamous cell carcinoma) 05/01/2021   right posterior flank inferior to axilla - well differentiated squamous cell carcinoma with superificial infiltration, base involved. [facility] 06/06/2021    Current Outpatient Medications:    acetaminophen (TYLENOL) 500 MG tablet, Take 500 mg by mouth every 6 (six) hours as needed for mild pain., Disp: , Rfl:    acyclovir (ZOVIRAX) 400 MG tablet, Take 400 mg by mouth 2 (two) times daily., Disp: , Rfl:    Alpha-Lipoic Acid 600 MG TABS, Take 600 mg by mouth daily., Disp: , Rfl:    amLODipine (NORVASC) 2.5 MG tablet, Take 1 tablet (2.5 mg total) by mouth daily., Disp: 90 tablet, Rfl: 1   apixaban (ELIQUIS) 2.5 MG TABS tablet, Take 1 tablet (2.5 mg total) by mouth 2 (two) times daily., Disp: 180 tablet, Rfl: 1   atorvastatin (LIPITOR) 20 MG tablet, TAKE 1 TABLET BY MOUTH ONCE DAILY, Disp: 90 tablet, Rfl: 3   Docusate Calcium (STOOL SOFTENER PO), Take 1 capsule by mouth daily., Disp: , Rfl:    DULoxetine (CYMBALTA) 60 MG capsule, TAKE 1 CAPSULE BY MOUTH ONCE DAILY, Disp: 90 capsule, Rfl: 1   furosemide (LASIX) 20 MG tablet, Take 1  tablet (20 mg total) by mouth daily., Disp: 90 tablet, Rfl: 3   gabapentin (NEURONTIN) 400 MG capsule, TAKE 1 CAPSULE BY MOUTH AT BEDTIME AND SECOND DOSE DURING THE DAY AS NEEDED, Disp: 150 capsule, Rfl: 1   hydrocortisone (ANUSOL-HC) 2.5 % rectal cream, Place 1 application rectally daily as needed for hemorrhoids or anal itching. For hemmorhoid, Disp: 30 g, Rfl: 0   lisinopril (ZESTRIL) 20 MG tablet, TAKE 1 TABLET BY MOUTH ONCE DAILY, Disp: 30 tablet, Rfl: 1   loratadine (CLARITIN) 10 MG tablet, Take 10 mg by mouth daily as needed., Disp: , Rfl:    memantine (NAMENDA) 5 MG tablet, TAKE 1 TABLET BY MOUTH TWICE DAILY, Disp: 180 tablet, Rfl: 1   metoprolol succinate (TOPROL-XL) 50  MG 24 hr tablet, Take 2 tablets (100 mg) in the morning and 1 tablet (50 mg) in the evening, Disp: 270 tablet, Rfl: 1   pantoprazole (PROTONIX) 40 MG tablet, TAKE 1 TABLET BY MOUTH ONCE DAILY, Disp: 90 tablet, Rfl: 1   polyethylene glycol (MIRALAX / GLYCOLAX) 17 g packet, Take 17 g by mouth daily as needed for mild constipation., Disp: 14 each, Rfl: 0   prednisoLONE acetate (PRED FORTE) 1 % ophthalmic suspension, Place 1 drop into the left eye daily. , Disp: , Rfl:    rOPINIRole (REQUIP) 1 MG tablet, TAKE 2 AND 1/2 TABLETS BY MOUTH AT BEDTIME, Disp: 225 tablet, Rfl: 0   tiZANidine (ZANAFLEX) 2 MG tablet, Take 2 mg by mouth daily., Disp: , Rfl:    traMADol (ULTRAM) 50 MG tablet, Take 50-100 mg by mouth at bedtime as needed., Disp: , Rfl:    vitamin B-12 (CYANOCOBALAMIN) 1000 MCG tablet, Take 1,000 mcg by mouth daily., Disp: , Rfl:    Vitamin D, Ergocalciferol, (DRISDOL) 1.25 MG (50000 UNIT) CAPS capsule, TAKE 1 CAPSULE BY MOUTH ONCE WEEKLY (EVERY 7 DAYS), Disp: 12 capsule, Rfl: 1  Social History   Tobacco Use  Smoking Status Former   Packs/day: 0.25   Years: 1.00   Pack years: 0.25   Types: Cigarettes   Quit date: 04/08/1976   Years since quitting: 45.4  Smokeless Tobacco Never  Tobacco Comments   pt smokes occasionally "socially"    Allergies  Allergen Reactions   Penicillins Rash and Other (See Comments)    Childhood Allergy Has patient had a PCN reaction causing immediate rash, facial/tongue/throat swelling, SOB or lightheadedness with hypotension: Yes Has patient had a PCN reaction causing severe rash involving mucus membranes or skin necrosis: Unknown Has patient had a PCN reaction that required hospitalization: No Has patient had a PCN reaction occurring within the last 10 years: No If all of the above answers are "NO", then may proceed with Cephalosporin use.    Objective:  There were no vitals filed for this visit. There is no height or weight on file to calculate  BMI. Constitutional Well developed. Well nourished.  Vascular Dorsalis pedis pulses palpable bilaterally. Posterior tibial pulses palpable bilaterally. Capillary refill normal to all digits.  No cyanosis or clubbing noted. Pedal hair growth normal.  Neurologic Normal speech. Oriented to person, place, and time. Epicritic sensation to light touch grossly present bilaterally.  Dermatologic Right fifth metatarsal phalangeal joint lateral wound.  Probing down to subcutaneous tissue.  Some purulent drainage noted no redness noted.  No erythema noted.  No malodor present   Orthopedic: Normal joint ROM without pain or crepitus bilaterally. No visible deformities. No bony tenderness.   Radiographs: None Assessment:   1.  Peripheral vascular disease ([place])   2. Ulcerated, foot, right, with fat layer exposed ([place])     Plan:  Patient was evaluated and treated and all questions answered.  Right lateral fifth metatarsal MPJ wound -All questions and concerns were discussed with the patient in extensive detail -There is superficial purulent drainage that was expressed.  All drainage was expressed.  There is redness associated with it I believe patient would benefit from doxycycline sent 14 days of doxycycline to her pharmacy I encouraged him to go to the emergency room if the redness gets worse or if the wound larger and increase in size.  Patient agrees with the plan and would like to proceed with antibiotic therapy -She is a high risk of losing the foot and the leg given the nature of underlying peripheral vascular disease/small vessel disease No follow-ups on file.

## 2021-08-30 NOTE — Telephone Encounter (Signed)
Noted. Thank you. Agree with recommendations given.

## 2021-08-30 NOTE — Telephone Encounter (Signed)
[place] Night - Client TELEPHONE ADVICE RECORD [facility] Patient Name: [name] Gender: Female DOB: 1930/01/30 Age: 88 Y 2 M 27 D Return Phone Number: [phone] (Primary) Address: City/ State/ Zip: [place]   [address] Client [facility] Night - Client Client Site [place] Provider Ria Bush - MD Contact Type Call Who Is Calling Patient / Member / Family / Caregiver Call Type Triage / Clinical Caller Name PATRICIA COLEMAN Relationship To Patient Daughter Return Phone Number [phone] (Primary) Chief Complaint OVERDOSE took too much medication at once Reason for Call Symptomatic / Request for [place] states she has questions about medications for her mother who has taken her morning medications twice today. Translation No Nurse Assessment Nurse: Phoebe Perch, RN, Dagoberto Date/Time (Eastern Time): 08/29/2021 5:56:13 PM Confirm and document reason for call. If symptomatic, describe symptoms. ---Caller states she has questions about medications for her mother who has taken her morning medications twice today. She takes 14 pills every day and she took all 14 pills each twice. As per caller, patient now sounds fine and is doing fine. Does the patient have any new or worsening symptoms? ---Yes Will a triage be completed? ---Yes Related visit to physician within the last 2 weeks? ---No Does the PT have any chronic conditions? (i.e. diabetes, asthma, this includes High risk factors for pregnancy, etc.) ---Unknown Is this a behavioral health or substance abuse call? ---No Guidelines Guideline Title Affirmed Question Affirmed Notes Nurse Date/Time (Eastern Time) Poisoning Triager unable to answer question Phoebe Perch, RN, Dagoberto 08/29/2021 5:58:43 PM Disp. Time Eilene Ghazi Time) Disposition Final User 08/29/2021 5:55:34 PM Send to  Urgent Natale [place], [place] 08/29/2021 6:01:34 PM Call [place] Now Yes Cervantes, RN, Dagoberto PLEASE NOTE: All timestamps contained within this report are represented as [place] Standard Time. CONFIDENTIALTY NOTICE: This fax transmission is intended only for the addressee. It contains information that is legally privileged, confidential or otherwise protected from use or disclosure. If you are not the intended recipient, you are strictly prohibited from reviewing, disclosing, copying using or disseminating any of this information or taking any action in reliance on or regarding this information. If you have received this fax in error, please notify us immediately by telephone so that we can arrange for its return to [place]. Phone: [phone], Toll-Free: [phone], Fax: [phone] Page: 2 of 2 Call Id: [id number] [place] Disagree/Comply Comply Caller Understands Yes PreDisposition Did not know what to do Care Advice Given Per Guideline [place]: * You need to call the [facility] now. [facility] advice is a free service. [place] NUMBER: [place]: [phone] * This number will automatically connect you with your local poison center. CARE ADVICE given per Poisoning (Adult) guideline. Comments User: Zenaida Deed, RN Date/Time Eilene Ghazi Time): 08/29/2021 6:02:11 PM As per caller, we cannot have patient join the call because she is hard of hearing. As per caller, she talked to patient earlier during the day and patient stated she was fine and had no symptoms. Triage had to be done without patient present

## 2021-08-30 NOTE — Addendum Note (Signed)
Addended by: Kris Mouton on: 08/30/2021 11:55 AM   Modules accepted: Orders

## 2021-08-31 ENCOUNTER — Telehealth: Payer: Self-pay | Admitting: Podiatry

## 2021-08-31 ENCOUNTER — Other Ambulatory Visit: Payer: Self-pay

## 2021-08-31 LAB — BASIC METABOLIC PANEL
BUN: 14 mg/dL (ref 6–23)
CO2: 31 mEq/L (ref 19–32)
Calcium: 9.6 mg/dL (ref 8.4–10.5)
Chloride: 102 mEq/L (ref 96–112)
Creatinine, Ser: 1.03 mg/dL (ref 0.40–1.20)
GFR: 47.96 mL/min — ABNORMAL LOW (ref >60.00)
Glucose, Bld: 94 mg/dL (ref 70–99)
Potassium: 4.2 mEq/L (ref 3.5–5.1)
Sodium: 143 mEq/L (ref 135–145)

## 2021-08-31 MED ORDER — DOXYCYCLINE HYCLATE 100 MG PO TABS: 100.0000 mg | ORAL_TABLET | Freq: Two times a day (BID) | ORAL | 0 refills | Status: DC

## 2021-08-31 NOTE — Telephone Encounter (Signed)
Pt's daughter, [name] called in about her mother's 14 day Doxycycline RX. Pt was seen in the office on 5/25. She would like the RX to sent [address] in [place], [place], [place]. Please advise.

## 2021-09-04 ENCOUNTER — Ambulatory Visit: Payer: Medicare Other | Admitting: Cardiovascular Disease

## 2021-09-04 ENCOUNTER — Encounter: Payer: Self-pay | Admitting: Cardiovascular Disease

## 2021-09-04 VITALS — BP 122/80 | HR 97 | Ht 62.0 in | Wt 140.0 lb

## 2021-09-04 DIAGNOSIS — I4821 Permanent atrial fibrillation: Secondary | ICD-10-CM | POA: Diagnosis not present

## 2021-09-04 DIAGNOSIS — E785 Hyperlipidemia, unspecified: Secondary | ICD-10-CM | POA: Diagnosis not present

## 2021-09-04 DIAGNOSIS — I10 Essential (primary) hypertension: Secondary | ICD-10-CM | POA: Diagnosis not present

## 2021-09-04 DIAGNOSIS — I5032 Chronic diastolic (congestive) heart failure: Secondary | ICD-10-CM

## 2021-09-04 DIAGNOSIS — I739 Peripheral vascular disease, unspecified: Secondary | ICD-10-CM | POA: Diagnosis not present

## 2021-09-04 MED ORDER — APIXABAN 5 MG PO TABS: 5.0000 mg | ORAL_TABLET | Freq: Two times a day (BID) | ORAL | 0 refills | Status: DC

## 2021-09-04 MED ORDER — METOPROLOL SUCCINATE ER 100 MG PO TB24: 100.0000 mg | ORAL_TABLET | Freq: Two times a day (BID) | ORAL | 1 refills | Status: DC

## 2021-09-04 NOTE — Patient Instructions (Signed)
Medication Instructions:  Your physician has recommended you make the following change in your medication:   - STOP Amlodipine  -INCREASE Metoprolol to 100 mg twice a day. An Rx has been sent to your pharmacy  -INCREASE Eliquis to 5 mg twice a day. An Rx has been sent to your pharmacy  *If you need a refill on your cardiac medications before your next appointment, please call your pharmacy*   Lab Work: None ordered  If you have labs (blood work) drawn today and your tests are completely normal, you will receive your results only by: [place] (if you have MyChart) OR A paper copy in the mail If you have any lab test that is abnormal or we need to change your treatment, we will call you to review the results.   Testing/Procedures: None ordered   Follow-Up: At [facility], you and your health needs are our priority.  As part of our continuing mission to provide you with exceptional heart care, we have created designated Provider Care Teams.  These Care Teams include your primary Cardiologist (physician) and Advanced Practice Providers (APPs -  Physician Assistants and Nurse Practitioners) who all work together to provide you with the care you need, when you need it.  We recommend signing up for the patient portal called "MyChart".  Sign up information is provided on this After Visit Summary.  MyChart is used to connect with patients for Virtual Visits (Telemedicine).  Patients are able to view lab/test results, encounter notes, upcoming appointments, etc.  Non-urgent messages can be sent to your provider as well.   To learn more about what you can do with MyChart, go to [email or web address].    Your next appointment:   4 month(s)  The format for your next appointment:   In Person  Provider:   You may see [name], MD or one of the following Advanced Practice Providers on your designated Care Team:   Murray Hodgkins, NP Christell Faith, PA-C Cadence Kathlen Mody,  [place]   Other Instructions N/A  Important Information About Sugar

## 2021-09-04 NOTE — Progress Notes (Signed)
Cardiology Office Note   Date:  09/04/2021   ID:  [name], DOB [birth year], MRN [id number]  PCP:  Ria Bush, MD  Cardiologist:   [name], MD   Chief Complaint  Patient presents with   Follow-up    3 month follow up. Patient states that she experiences shortness of breath. Feet and ankles swell when not elevated.      History of Present Illness: [name] is a 88 y.o. female who is here today for follow-up visit regarding atrial fibrillation , mild nonobstructive coronary artery disease and chronic diastolic heart failure.  Other medical problems include chronic dyspnea and fatigue, essential hypertension, hyperlipidemia, rheumatoid arthritis and peripheral arterial disease.   Most recent echocardiogram in February 2021 showed normal LV systolic function, grade 2 diastolic dysfunction, severe pulmonary hypertension, moderate mitral regurgitation and moderate tricuspid regurgitation. Right and left cardiac catheterization was done in April 2021.  It showed mild to moderate nonobstructive coronary artery disease.  Worst stenosis was 50% in the ostial right coronary artery which was not significant by fractional flow reserve.  Right heart catheterization at that time showed mildly elevated filling pressures, mild pulmonary hypertension and low normal cardiac output. She was hospitalized in July after a fall and fracture of the right wrist.  She had acute on chronic renal failure which improved after discontinuing furosemide.  She had lower extremity arterial angiogram done by Dr. Lucky Cowboy with endovascular intervention on tibial vessels bilaterally.  She had COVID-19 infection in June of 2022. She was hospitalized in July , 2022 with cholelithiasis.  She was found to have elevated LFTs and mild lactic acidosis.  She was septic with E. coli.  She was diagnosed with ascending cholangitis and was treated with antibiotics.    She was noted to be in atrial fibrillation with  RVR and September and thus the dose of amiodarone was increased and I proceeded with successful cardioversion.  However, she was noted to be back in atrial fibrillation upon follow-up and thus we elected to pursue rate control.  Amiodarone was discontinued and the dose of metoprolol was gradually increased.  She has been doing reasonably well overall with improved shortness of breath but she continues to have exertional palpitations.  No chest pain.  She has mild bilateral leg edema.  Her appetite has improved and she gained some weight.    Past Medical History:  Diagnosis Date   Abdominal aortic atherosclerosis ([place]) 09/2015   by xray   Anxiety    BCC (basal cell carcinoma of skin) 05/2011   on nose   BCC (basal cell carcinoma of skin) 08/09/2020   upper chest, [facility] 12/27/20   Breast lesion    a. diagnosed with complex sclerosing lesion with calcifications of the right breast in 08/2016   Chest pain    a. nuclear stress test 03/2012: normal; b. 09/2018 MV: EF >65%, no isch/scar.    DDD (degenerative disc disease), lumbosacral 12/06/1992   Gallstones 09/2015   incidentally by xray   GERD (gastroesophageal reflux disease) 05/1999   HERPES ZOSTER 04/13/2007   Qualifier: Diagnosis of  By: [name], [name]    Hiatal hernia    History of shingles    ophthalmic, takes acyclovir daily   Hyperlipidemia 12/2003   Hypertension 1990   Mitral regurgitation    a. echo 03/2012: EF 60-65%, no RWMA, mild to mod AI/MR, mod TR, PASP 37 mmHg   Osteoarthritis 08/21/1989   Osteoporosis with fracture 11/1997  with compression fracture T12   Persistent atrial fibrillation ([place]) 03/16/2012   a. CHADS2VASc => 5 (HTN, age x 2, vascular disease, sex category)-->Eliquis 5 BID;  b. Recurrent AF in 06/2016;  c. 01/2017 s/p DCCV;  d. 02/2017 recurrent AFib->Amio added->DCCV; e. 03/2017 recurrent AFib, s/p reload of amio and DCCV 04/02/2017   POSTHERPETIC NEURALGIA 04/20/2007   Qualifier: Diagnosis of   By: [name], Billie-Lynn Daniels    Rheumatoid arthritis ([place])    RLS (restless legs syndrome)    SCC (squamous cell carcinoma) 05/01/2021   right posterior flank inferior to axilla - well differentiated squamous cell carcinoma with superificial infiltration, base involved. [facility] 06/06/2021    Past Surgical History:  Procedure Laterality Date   ABDOMINAL HYSTERECTOMY  Age 10 - 41   S/P TAH   abdominal ultrasound  04/23/2004   Gallstones   BREAST EXCISIONAL BIOPSY Right 2018   neg/benign complex sclerosing lesion   BREAST LUMPECTOMY WITH RADIOACTIVE SEED LOCALIZATION Right 10/2016   breast lumpectomy with radioactive seed localization and margin assessment for complex sclerosing lesion Renelda Loma)   BREAST LUMPECTOMY WITH RADIOACTIVE SEED LOCALIZATION Right 11/05/2016   Procedure: RIGHT BREAST LUMPECTOMY WITH RADIOACTIVE SEED LOCALIZATION;  Surgeon: Fanny Skates, MD;  Location: [place];  Service: General;  Laterality: Right;   CARDIAC CATHETERIZATION     CARDIOVERSION N/A 01/31/2017   Procedure: CARDIOVERSION;  Surgeon: Wellington Hampshire, MD;  Location: [facility];  Service: Cardiovascular;  Laterality: N/A;   CARDIOVERSION N/A 03/03/2017   Procedure: CARDIOVERSION;  Surgeon: Wellington Hampshire, MD;  Location: [facility];  Service: Cardiovascular;  Laterality: N/A;   CARDIOVERSION N/A 04/02/2017   Procedure: CARDIOVERSION;  Surgeon: Wellington Hampshire, MD;  Location: [facility];  Service: Cardiovascular;  Laterality: N/A;   CARDIOVERSION N/A 01/16/2018   Procedure: CARDIOVERSION;  Surgeon: Wellington Hampshire, MD;  Location: [facility];  Service: Cardiovascular;  Laterality: N/A;   CARDIOVERSION N/A 12/25/2020   Procedure: CARDIOVERSION;  Surgeon: Wellington Hampshire, MD;  Location: [facility];  Service: Cardiovascular;  Laterality: N/A;   CATARACT EXTRACTION  05/2010   left eye   CERVICAL DISCECTOMY  1992   Fusion (Dr. Joya Salm)   ERCP N/A 10/30/2020   Procedure: ENDOSCOPIC RETROGRADE  CHOLANGIOPANCREATOGRAPHY (ERCP);  Surgeon: Jackquline Denmark, MD;  Location: [facility];  Service: Endoscopy;  Laterality: N/A;   ESI Bilateral 01/2016   S1 transforaminal ESI x3   ESOPHAGOGASTRODUODENOSCOPY  01/01/2002   with ulcer, bx. neg; + stricture gastric ulcer with hemorrhage   ESOPHAGOGASTRODUODENOSCOPY  04/23/2004   Esophageal stricture -- dilated   INTRAVASCULAR PRESSURE WIRE/FFR STUDY N/A 07/12/2019   Procedure: INTRAVASCULAR PRESSURE WIRE/FFR STUDY;  Surgeon: Wellington Hampshire, MD;  Location: [place] CV LAB;  Service: Cardiovascular;  Laterality: N/A;   LOWER EXTREMITY ANGIOGRAPHY Left 02/01/2019   LOWER EXTREMITY ANGIOGRAPHY;  Surgeon: Algernon Huxley, MD   LOWER EXTREMITY ANGIOGRAPHY Right 03/25/2019   Procedure: LOWER EXTREMITY ANGIOGRAPHY;  Surgeon: Algernon Huxley, MD;  Location: [place] CV LAB;  Service: Cardiovascular;  Laterality: Right;   LOWER EXTREMITY ANGIOGRAPHY Right 08/02/2019   Procedure: LOWER EXTREMITY ANGIOGRAPHY;  Surgeon: Algernon Huxley, MD;  Location: [place] CV [facility];  Service: Cardiovascular;  Laterality: Right;   nuclear stress test  03/2012   no ischemia   PERIPHERAL VASCULAR BALLOON ANGIOPLASTY Left 01/2019   Percutaneous transluminal angioplasty of left anterior and posterior tibial artery with 2.5 mm diameter by 22 cm length angioplasty balloon ([place])   REMOVAL OF  STONES  10/30/2020   Procedure: REMOVAL OF STONES;  Surgeon: Jackquline Denmark, MD;  Location: [place];  Service: Endoscopy;;   RIGHT/LEFT HEART CATH AND CORONARY ANGIOGRAPHY Bilateral 07/12/2019   Procedure: RIGHT/LEFT HEART CATH AND CORONARY ANGIOGRAPHY;  Surgeon: Wellington Hampshire, MD;  Location: [place] CV [facility];  Service: Cardiovascular;  Laterality: Bilateral;   SKIN CANCER EXCISION  05/20/2011   BCC from nose   SPHINCTEROTOMY  10/30/2020   Procedure: SPHINCTEROTOMY;  Surgeon: Jackquline Denmark, MD;  Location: [facility]OSCOPY;  Service: Endoscopy;;   US ECHOCARDIOGRAPHY  03/2012   in  flutter, EF 60%, mild-mod aortic, mitral, tricuspid regurg, mildly dilated LA     Current Outpatient Medications  Medication Sig Dispense Refill   acetaminophen (TYLENOL) 500 MG tablet Take 500 mg by mouth every 6 (six) hours as needed for mild pain.     acyclovir (ZOVIRAX) 400 MG tablet Take 400 mg by mouth 2 (two) times daily.     Alpha-Lipoic Acid 600 MG TABS Take 600 mg by mouth daily.     amLODipine (NORVASC) 2.5 MG tablet Take 1 tablet (2.5 mg total) by mouth daily. 90 tablet 1   apixaban (ELIQUIS) 2.5 MG TABS tablet Take 1 tablet (2.5 mg total) by mouth 2 (two) times daily. 180 tablet 1   atorvastatin (LIPITOR) 20 MG tablet TAKE 1 TABLET BY MOUTH ONCE DAILY 90 tablet 3   Docusate Calcium (STOOL SOFTENER PO) Take 1 capsule by mouth daily.     doxycycline (VIBRA-TABS) 100 MG tablet Take 1 tablet (100 mg total) by mouth 2 (two) times daily for 14 days. 28 tablet 0   DULoxetine (CYMBALTA) 60 MG capsule TAKE 1 CAPSULE BY MOUTH ONCE DAILY 90 capsule 1   furosemide (LASIX) 20 MG tablet Take 1 tablet (20 mg total) by mouth daily. 90 tablet 3   gabapentin (NEURONTIN) 400 MG capsule TAKE 1 CAPSULE BY MOUTH AT BEDTIME AND SECOND DOSE DURING THE DAY AS NEEDED 150 capsule 1   hydrocortisone (ANUSOL-HC) 2.5 % rectal cream Place 1 application rectally daily as needed for hemorrhoids or anal itching. For hemmorhoid 30 g 0   lisinopril (ZESTRIL) 20 MG tablet TAKE 1 TABLET BY MOUTH ONCE DAILY 30 tablet 1   memantine (NAMENDA) 5 MG tablet TAKE 1 TABLET BY MOUTH TWICE DAILY 180 tablet 1   metoprolol succinate (TOPROL-XL) 50 MG 24 hr tablet Take 2 tablets (100 mg) in the morning and 1 tablet (50 mg) in the evening 270 tablet 1   pantoprazole (PROTONIX) 40 MG tablet TAKE 1 TABLET BY MOUTH ONCE DAILY 90 tablet 1   polyethylene glycol (MIRALAX / GLYCOLAX) 17 g packet Take 17 g by mouth daily as needed for mild constipation. 14 each 0   prednisoLONE acetate (PRED FORTE) 1 % ophthalmic suspension Place 1 drop  into the left eye daily.      rOPINIRole (REQUIP) 1 MG tablet TAKE 2 AND 1/2 TABLETS BY MOUTH AT BEDTIME 225 tablet 0   tiZANidine (ZANAFLEX) 2 MG tablet Take 2 mg by mouth daily.     traMADol (ULTRAM) 50 MG tablet Take 50-100 mg by mouth at bedtime as needed.     vitamin B-12 (CYANOCOBALAMIN) 1000 MCG tablet Take 1,000 mcg by mouth daily.     Vitamin D, Ergocalciferol, (DRISDOL) 1.25 MG (50000 UNIT) CAPS capsule TAKE 1 CAPSULE BY MOUTH ONCE WEEKLY (EVERY 7 DAYS) 12 capsule 1   No current facility-administered medications for this visit.    Allergies:   Penicillins  Social History:  The patient  reports that she quit smoking about 45 years ago. Her smoking use included cigarettes. She has a 0.25 pack-year smoking history. She has never used smokeless tobacco. She reports that she does not drink alcohol and does not use drugs.   Family History:  The patient's family history includes Cancer in her brother; Diabetes in her brother, brother, brother, and sister; Emphysema in her mother; Heart disease in her brother and father; Hypertension in her brother; Stroke in her brother, father, and sister.    ROS:  Please see the history of present illness.   Otherwise, review of systems are positive for none.   All other systems are reviewed and negative.    PHYSICAL EXAM: VS:  BP 122/80 (BP Location: Left Arm, Patient Position: Sitting, Cuff Size: Normal)   Pulse 97   Ht '5\' 2"'$  (1.575 m)   Wt 140 lb (63.5 kg)   SpO2 97%   BMI 25.61 kg/m  , BMI Body mass index is 25.61 kg/m. GEN: Well nourished, well developed, in no acute distress  HEENT: normal  Neck: no JVD, carotid bruits, or masses Cardiac: Irregularly irregular,no murmurs, rubs, or gallops, mild bilateral leg edema worse on the left side with chronic stasis dermatitis. Respiratory:  clear to auscultation bilaterally, normal work of breathing GI: soft, nontender, nondistended, + BS MS: no deformity or atrophy  Skin: warm and dry, no  rash Neuro:  Strength and sensation are intact Psych: euthymic mood, full affect  EKG:  EKG is ordered today. The ekg ordered today demonstrates atrial fibrillation with ventricular rate of 97 bpm.  Poor R wave progression in the anterior leads and possible old inferior infarct.   Recent Labs: 10/25/2020: Magnesium 1.6 05/30/2021: ALT 10; Hemoglobin 13.5; Platelets 232.0; Pro B Natriuretic peptide (BNP) 305.0; TSH 2.44 08/30/2021: BUN 14; Creatinine, Ser 1.03; Potassium 4.2; Sodium 143    Lipid Panel    Component Value Date/Time   CHOL 133 06/15/2020 1012   TRIG 70.0 06/15/2020 1012   HDL 71.70 06/15/2020 1012   CHOLHDL 2 06/15/2020 1012   VLDL 14.0 06/15/2020 1012   LDLCALC 47 06/15/2020 1012   LDLDIRECT 173.1 02/19/2010 0908      Wt Readings from Last 3 Encounters:  09/04/21 140 lb (63.5 kg)  05/30/21 140 lb (63.5 kg)  04/12/21 139 lb 4 oz (63.2 kg)           View : No data to display.            ASSESSMENT AND PLAN:  1.  Permanent atrial fibrillation: Ventricular rate is more controlled than before but still not optimal.  I elected to increase Toprol to 100 mg twice daily.  Her weight is now consistently above 60 kg and thus I increased Eliquis back to 5 mg twice daily.  Her creatinine is below 1.5.    2. Chronic diastolic heart failure: She appears to be euvolemic on small dose furosemide 20 mg daily.  3. Essential hypertension: Due to increase of the dose of Toprol, I elected to discontinue amlodipine today.  4. Hyperlipidemia: Currently on atorvastatin 20 mg once daily with most recent LDL of 50.  5.  Peripheral arterial disease: Status post tibial vessel intervention managed by Dr. Lucky Cowboy.   Disposition:   FU in 4 months.  Signed,  [name], MD  09/04/2021 3:34 PM    [place]

## 2021-09-04 NOTE — Telephone Encounter (Signed)
Spoke with [name]-see other phone note. Proceed with Prolia. Labs done on 08/30/21. Cr Cl is 36.39 mL/min. Calcium normal at 9.6 OV 09/12/21

## 2021-09-10 ENCOUNTER — Ambulatory Visit: Payer: Medicare Other | Admitting: Dermatology

## 2021-09-10 DIAGNOSIS — Z85828 Personal history of other malignant neoplasm of skin: Secondary | ICD-10-CM

## 2021-09-10 DIAGNOSIS — L821 Other seborrheic keratosis: Secondary | ICD-10-CM | POA: Diagnosis not present

## 2021-09-10 DIAGNOSIS — L820 Inflamed seborrheic keratosis: Secondary | ICD-10-CM

## 2021-09-10 DIAGNOSIS — L578 Other skin changes due to chronic exposure to nonionizing radiation: Secondary | ICD-10-CM | POA: Diagnosis not present

## 2021-09-10 DIAGNOSIS — L570 Actinic keratosis: Secondary | ICD-10-CM | POA: Diagnosis not present

## 2021-09-10 DIAGNOSIS — L814 Other melanin hyperpigmentation: Secondary | ICD-10-CM

## 2021-09-10 NOTE — Patient Instructions (Signed)
Cryotherapy Aftercare  Wash gently with soap and water everyday.   Apply Vaseline and Band-Aid daily until healed.     Due to recent changes in healthcare laws, you may see results of your pathology and/or laboratory studies on MyChart before the doctors have had a chance to review them. We understand that in some cases there may be results that are confusing or concerning to you. Please understand that not all results are received at the same time and often the doctors may need to interpret multiple results in order to provide you with the best plan of care or course of treatment. Therefore, we ask that you please give us 2 business days to thoroughly review all your results before contacting the office for clarification. Should we see a critical lab result, you will be contacted sooner.   If You Need Anything After Your Visit  If you have any questions or concerns for your doctor, please call our main line at [phone] and press option 4 to reach your doctor's medical assistant. If no one answers, please leave a voicemail as directed and we will return your call as soon as possible. Messages left after 4 pm will be answered the following business day.   You may also send us a message via MyChart. We typically respond to MyChart messages within 1-2 business days.  For prescription refills, please ask your pharmacy to contact our office. Our fax number is [phone].  If you have an urgent issue when the clinic is closed that cannot wait until the next business day, you can page your doctor at the number below.    Please note that while we do our best to be available for urgent issues outside of office hours, we are not available 24/7.   If you have an urgent issue and are unable to reach us, you may choose to seek medical care at your doctor's office, retail clinic, urgent care center, or emergency room.  If you have a medical emergency, please immediately call 911 or go to the  emergency department.  Pager Numbers  - Dr. Kowalski: [phone]  - Dr. Moye: [phone]  - Dr. Stewart: [phone]  In the event of inclement weather, please call our main line at [phone] for an update on the status of any delays or closures.  Dermatology Medication Tips: Please keep the boxes that topical medications come in in order to help keep track of the instructions about where and how to use these. Pharmacies typically print the medication instructions only on the boxes and not directly on the medication tubes.   If your medication is too expensive, please contact our office at [phone] option 4 or send us a message through MyChart.   We are unable to tell what your co-pay for medications will be in advance as this is different depending on your insurance coverage. However, we may be able to find a substitute medication at lower cost or fill out paperwork to get insurance to cover a needed medication.   If a prior authorization is required to get your medication covered by your insurance company, please allow us 1-2 business days to complete this process.  Drug prices often vary depending on where the prescription is filled and some pharmacies may offer cheaper prices.  The website www.goodrx.com contains coupons for medications through different pharmacies. The prices here do not account for what the cost may be with help from insurance (it may be cheaper with your insurance), but the website can   give you the price if you did not use any insurance.  - You can print the associated coupon and take it with your prescription to the pharmacy.  - You may also stop by our office during regular business hours and pick up a GoodRx coupon card.  - If you need your prescription sent electronically to a different pharmacy, notify our office through [place] MyChart or by phone at [phone] option 4.     Si Usted Necesita Algo Despus de Su Visita  Tambin puede  enviarnos un mensaje a travs de MyChart. Por lo general respondemos a los mensajes de MyChart en el transcurso de 1 a 2 das hbiles.  Para renovar recetas, por favor pida a su farmacia que se ponga en contacto con nuestra oficina. Nuestro nmero de fax es el [phone].  Si tiene un asunto urgente cuando la clnica est cerrada y que no puede esperar hasta el siguiente da hbil, puede llamar/localizar a su doctor(a) al nmero que aparece a continuacin.   Por favor, tenga en cuenta que aunque hacemos todo lo posible para estar disponibles para asuntos urgentes fuera del horario de oficina, no estamos disponibles las 24 horas del da, los 7 das de la semana.   Si tiene un problema urgente y no puede comunicarse con nosotros, puede optar por buscar atencin mdica  en el consultorio de su doctor(a), en una clnica privada, en un centro de atencin urgente o en una sala de emergencias.  Si tiene una emergencia mdica, por favor llame inmediatamente al 911 o vaya a la sala de emergencias.  Nmeros de bper  - Dr. Kowalski: [phone]  - Dra. Moye: [phone]  - Dra. Stewart: [phone]  En caso de inclemencias del tiempo, por favor llame a nuestra lnea principal al [phone] para una actualizacin sobre el estado de cualquier retraso o cierre.  Consejos para la medicacin en dermatologa: Por favor, guarde las cajas en las que vienen los medicamentos de uso tpico para ayudarle a seguir las instrucciones sobre dnde y cmo usarlos. Las farmacias generalmente imprimen las instrucciones del medicamento slo en las cajas y no directamente en los tubos del medicamento.   Si su medicamento es muy caro, por favor, pngase en contacto con nuestra oficina llamando al [phone] y presione la opcin 4 o envenos un mensaje a travs de MyChart.   No podemos decirle cul ser su copago por los medicamentos por adelantado ya que esto es diferente dependiendo de la cobertura de su seguro.  Sin embargo, es posible que podamos encontrar un medicamento sustituto a menor costo o llenar un formulario para que el seguro cubra el medicamento que se considera necesario.   Si se requiere una autorizacin previa para que su compaa de seguros cubra su medicamento, por favor permtanos de 1 a 2 das hbiles para completar este proceso.  Los precios de los medicamentos varan con frecuencia dependiendo del lugar de dnde se surte la receta y alguna farmacias pueden ofrecer precios ms baratos.  El sitio web www.goodrx.com tiene cupones para medicamentos de diferentes farmacias. Los precios aqu no tienen en cuenta lo que podra costar con la ayuda del seguro (puede ser ms barato con su seguro), pero el sitio web puede darle el precio si no utiliz ningn seguro.  - Puede imprimir el cupn correspondiente y llevarlo con su receta a la farmacia.  - Tambin puede pasar por nuestra oficina durante el horario de atencin regular y recoger una tarjeta de cupones de GoodRx.  -   Si necesita que su receta se enve electrnicamente a una farmacia diferente, informe a nuestra oficina a travs de MyChart de [place] o por telfono llamando al [phone] y presione la opcin 4.  

## 2021-09-10 NOTE — Progress Notes (Signed)
Follow-Up Visit   Subjective  [name] is a 88 y.o. female who presents for the following: Follow-up (Patient here to recheck [facility] site for bx proven SCC at right posterior flank inferior to axilla and AK's at right dorsal hand. ).  She has a irritated growth under her eye which she would like removed.  Patient accompanied by daughter, [name], who contributes to history.   The following portions of the chart were reviewed this encounter and updated as appropriate:       Review of Systems:  No other skin or systemic complaints except as noted in HPI or Assessment and Plan.  Objective  Well appearing patient in no apparent distress; mood and affect are within normal limits.  A focused examination was performed including hands, back, face. Relevant physical exam findings are noted in the Assessment and Plan.  right hand dorsum at proximal 3 MCP Keratotic macule  right infraocular Erythematous stuck-on, waxy papule    Assessment & Plan  AK (actinic keratosis) right hand dorsum at proximal 3 MCP  Actinic keratoses are precancerous spots that appear secondary to cumulative UV radiation exposure/sun exposure over time. They are chronic with expected duration over 1 year. A portion of actinic keratoses will progress to squamous cell carcinoma of the skin. It is not possible to reliably predict which spots will progress to skin cancer and so treatment is recommended to prevent development of skin cancer.  Recommend daily broad spectrum sunscreen SPF 30+ to sun-exposed areas, reapply every 2 hours as needed.  Recommend staying in the shade or wearing long sleeves, sun glasses (UVA+UVB protection) and wide brim hats (4-inch brim around the entire circumference of the hat). Call for new or changing lesions.  Destruction of lesion - right hand dorsum at proximal 3 MCP  Destruction method: cryotherapy   Informed consent: discussed and consent obtained   Lesion destroyed using  liquid nitrogen: Yes   Region frozen until ice ball extended beyond lesion: Yes   Outcome: patient tolerated procedure well with no complications   Post-procedure details: wound care instructions given   Additional details:  Prior to procedure, discussed risks of blister formation, small wound, skin dyspigmentation, or rare scar following cryotherapy. Recommend Vaseline ointment to treated areas while healing.   Inflamed seborrheic keratosis right infraocular  Symptomatic, irritating, patient would like treated.   Destruction of lesion - right infraocular  Destruction method: cryotherapy   Informed consent: discussed and consent obtained   Lesion destroyed using liquid nitrogen: Yes   Region frozen until ice ball extended beyond lesion: Yes   Outcome: patient tolerated procedure well with no complications   Post-procedure details: wound care instructions given   Additional details:  Prior to procedure, discussed risks of blister formation, small wound, skin dyspigmentation, or rare scar following cryotherapy. Recommend Vaseline ointment to treated areas while healing.    History of Squamous Cell Carcinoma of the Skin - No evidence of recurrence today at right posterior flank inferior to axilla - Recommend regular full body skin exams - Recommend daily broad spectrum sunscreen SPF 30+ to sun-exposed areas, reapply every 2 hours as needed.  - Call if any new or changing lesions are noted between office visits   Seborrheic Keratoses - Stuck-on, waxy, tan-brown papules and/or plaques  - Benign-appearing - Discussed benign etiology and prognosis. - Observe - Call for any changes  Lentigines - Scattered tan macules - Due to sun exposure - Benign-appering, observe - Recommend daily broad spectrum sunscreen SPF  30+ to sun-exposed areas, reapply every 2 hours as needed. - Call for any changes  Actinic Damage - chronic, secondary to cumulative UV radiation exposure/sun exposure over  time - diffuse scaly erythematous macules with underlying dyspigmentation - Recommend daily broad spectrum sunscreen SPF 30+ to sun-exposed areas, reapply every 2 hours as needed.  - Recommend staying in the shade or wearing long sleeves, sun glasses (UVA+UVB protection) and wide brim hats (4-inch brim around the entire circumference of the hat). - Call for new or changing lesions.   Return for as scheduled with Dr. Laurence Ferrari.  Graciella Belton, RMA, am acting as scribe for Brendolyn Patty, MD .  Documentation: I have reviewed the above documentation for accuracy and completeness, and I agree with the above.  Brendolyn Patty MD

## 2021-09-11 ENCOUNTER — Telehealth: Payer: Self-pay | Admitting: Student

## 2021-09-11 NOTE — Telephone Encounter (Signed)
Rec'd a voicemail from patient's daughter [name], wanting to reschedule the Palliative f/u visit on 09/13/21.  Ret'd call to daughter and have reschedule visit for 10/02/21 @ 2 PM

## 2021-09-12 ENCOUNTER — Other Ambulatory Visit: Payer: Self-pay | Admitting: Family Medicine

## 2021-09-12 ENCOUNTER — Encounter: Payer: Self-pay | Admitting: Family Medicine

## 2021-09-12 ENCOUNTER — Ambulatory Visit (INDEPENDENT_AMBULATORY_CARE_PROVIDER_SITE_OTHER): Payer: Medicare Other | Admitting: Family Medicine

## 2021-09-12 VITALS — BP 140/84 | HR 84 | Temp 97.7°F | Ht 62.0 in | Wt 141.1 lb

## 2021-09-12 DIAGNOSIS — E782 Mixed hyperlipidemia: Secondary | ICD-10-CM

## 2021-09-12 DIAGNOSIS — G2581 Restless legs syndrome: Secondary | ICD-10-CM

## 2021-09-12 DIAGNOSIS — F331 Major depressive disorder, recurrent, moderate: Secondary | ICD-10-CM

## 2021-09-12 DIAGNOSIS — N1831 Chronic kidney disease, stage 3a: Secondary | ICD-10-CM | POA: Diagnosis not present

## 2021-09-12 DIAGNOSIS — E559 Vitamin D deficiency, unspecified: Secondary | ICD-10-CM

## 2021-09-12 DIAGNOSIS — M79605 Pain in left leg: Secondary | ICD-10-CM

## 2021-09-12 DIAGNOSIS — I700 Atherosclerosis of aorta: Secondary | ICD-10-CM

## 2021-09-12 DIAGNOSIS — M79604 Pain in right leg: Secondary | ICD-10-CM | POA: Diagnosis not present

## 2021-09-12 DIAGNOSIS — K219 Gastro-esophageal reflux disease without esophagitis: Secondary | ICD-10-CM

## 2021-09-12 DIAGNOSIS — Z0000 Encounter for general adult medical examination without abnormal findings: Secondary | ICD-10-CM | POA: Diagnosis not present

## 2021-09-12 DIAGNOSIS — I10 Essential (primary) hypertension: Secondary | ICD-10-CM

## 2021-09-12 DIAGNOSIS — I4819 Other persistent atrial fibrillation: Secondary | ICD-10-CM | POA: Diagnosis not present

## 2021-09-12 DIAGNOSIS — G8929 Other chronic pain: Secondary | ICD-10-CM

## 2021-09-12 DIAGNOSIS — R2681 Unsteadiness on feet: Secondary | ICD-10-CM

## 2021-09-12 DIAGNOSIS — E538 Deficiency of other specified B group vitamins: Secondary | ICD-10-CM

## 2021-09-12 DIAGNOSIS — L97519 Non-pressure chronic ulcer of other part of right foot with unspecified severity: Secondary | ICD-10-CM

## 2021-09-12 DIAGNOSIS — G6289 Other specified polyneuropathies: Secondary | ICD-10-CM

## 2021-09-12 DIAGNOSIS — W19XXXA Unspecified fall, initial encounter: Secondary | ICD-10-CM

## 2021-09-12 DIAGNOSIS — G3184 Mild cognitive impairment, so stated: Secondary | ICD-10-CM

## 2021-09-12 DIAGNOSIS — I5032 Chronic diastolic (congestive) heart failure: Secondary | ICD-10-CM

## 2021-09-12 DIAGNOSIS — M810 Age-related osteoporosis without current pathological fracture: Secondary | ICD-10-CM | POA: Diagnosis not present

## 2021-09-12 DIAGNOSIS — I739 Peripheral vascular disease, unspecified: Secondary | ICD-10-CM

## 2021-09-12 MED ORDER — DENOSUMAB 60 MG/ML ~~LOC~~ SOSY
60.0000 mg | PREFILLED_SYRINGE | Freq: Once | SUBCUTANEOUS | Status: AC
  Administered 2021-09-12: 60 mg via SUBCUTANEOUS

## 2021-09-12 MED ORDER — MEMANTINE HCL 5 MG PO TABS: ORAL_TABLET | ORAL | 1 refills | Status: DC

## 2021-09-12 MED ORDER — ATORVASTATIN CALCIUM 20 MG PO TABS: 20.0000 mg | ORAL_TABLET | Freq: Every day | ORAL | 3 refills | Status: DC

## 2021-09-12 NOTE — Progress Notes (Signed)
Patient ID: [name], female    DOB: [birth year], 88 y.o.   MRN: [id number]  This visit was conducted in person.  BP 140/84   Pulse 84   Temp 97.7 F (36.5 C) (Temporal)   Ht '5\' 2"'$  (1.575 m)   Wt 141 lb 2 oz (64 kg)   SpO2 97%   BMI 25.81 kg/m    CC: CPE Subjective:   HPI: [name] is a 88 y.o. female presenting on 09/12/2021 for Annual Exam [facility] prt 2. Pt accompanied by daughter, [name]. )   Saw health advisor last month for medicare wellness visit. Note reviewed.  Cognitive assessment not performed.   No results found.  Flowsheet Row Clinical Support from 08/27/2021 in [place] at [facility]  PHQ-2 Total Score 0          08/27/2021    1:23 PM 06/21/2020    2:11 PM 04/28/2019   12:13 PM 04/22/2018   12:44 PM 11/11/2016    3:41 PM  Fall Risk   Falls in the past year? '1 1 1 1 '$ No  Comment   Balance issues    Number falls in past yr: 0 '1 1 1   '$ Injury with Fall? 0 1 0 1   Comment  Left arm abrasion     Risk for fall due to : History of fall(s)  Medication side effect;History of fall(s);Impaired balance/gait Impaired balance/gait;Impaired mobility;History of fall(s)   Follow up Falls prevention discussed  Falls evaluation completed;Falls prevention discussed    1-2 falls this past year, no injury. Latest fall happened 1 month ago when she fell in the bathroom. She does have life alert but doesn't always use.  Completed HHPT 03/2020  Seeing podiatry for PAD with R foot ulcer, has also seen wound clinic. Recently started on doxycycline course. Wound seems to be healing well.   Saw cardiology for h/o atrial fibrillation with RVR s/p cardioversion with afib recurrence, now on metoprolol '100mg'$  bid and eliquis. Most recently amlodipine was stopped.  Notes BPs continue to fluctuate - brings home log showing: 118-169/80-107, nadir 94/68, HR 60-80s, O2 sat 91-96.  Notes ongoing fatigue - with low energy. She does nap during the day.   Constipation - stable  on current regimen of miralax and senna.  Notes discomfort behind R breast (ribcage) since fall 4 wks ago.   Memory - notes more difficulty with remembering names as well as short term memory difficulty. Leaves fridge door open, forgets names. No longer cooking or driving. Continues namenda '5mg'$  bid (started 12/2019).   Only on gabapentin '400mg'$  nightly, RLS symptoms continue. Continues requip 2.'5mg'$  nightly.   NCS - Abnormal study (2020). There is electrodiagnostic evidence of a chronic, severe, mixed polyneuropathy in the lower extremities.   Has seen [facility] for bilateral leg/foot pain s/p angiogram with intervention (ballon angioplasty) - with improvement in LE symptoms.    Preventative: Colon cancer screening - age out. Normal iFOBs last 05/2012. Denies blood in stool or urine.  Well woman - s/p hysterectomy, aged out of further screening.  Mammogram abnormal 2018 s/p R breast lumpectomy of benign complex sclerosing lesion (Dr Renelda Loma). Last mammogram @ [place] 06/2019 Birads1. Does breast exams at home. Undergoing Q2 yrs - will call and schedule.  DEXA 07/2016 - T -2.7 spine, -2.6 hip. Known osteoporosis with compression fractures, latest after fall 02/2017 now on prolia (started 08/2017)  DEXA 06/2019 - osteopenia T -2.4 L forearm. FRAX - hip fx risk >  3%. Continues prolia shots - today Lung cancer screen - not eligible  Flu - yearly [place] 05/2019, 06/2019, no booster Td - 2005 Pneumovax 2005, 06/2020, prevnar-13 01/2015 shingrix - discussed - h/o ocular shingles.  Advanced planning - Living will scanned 04/2017. No HCPOA form. Asked to bring me copy - son [name] is HCPOA. Does not want extended life support.  Seat belt use discussed Sunscreen use discussed. Now sees dermatology regularly. Ex smoker remotely quit Alcohol - seldom Dentist - has dentures - upcoming appt to check them Eye exam yearly - upcoming Bowel - constipation managed with miralax  Bladder - urge incontinence with  mild stress symptoms - not too bothersome this year   Lives with grandson who smokes, dog From [place].  Widowed 01/1999; 1st marriage 25 years / 2nd marriage 13 years. One son deceased 2023/02/05. Activity: active with dog Diet: good water, fruits/vegetables daily     Relevant past medical, surgical, family and social history reviewed and updated as indicated. Interim medical history since our last visit reviewed. Allergies and medications reviewed and updated. Outpatient Medications Prior to Visit  Medication Sig Dispense Refill   acetaminophen (TYLENOL) 500 MG tablet Take 500 mg by mouth every 6 (six) hours as needed for mild pain.     acyclovir (ZOVIRAX) 400 MG tablet Take 400 mg by mouth 2 (two) times daily.     Alpha-Lipoic Acid 600 MG TABS Take 600 mg by mouth daily.     apixaban (ELIQUIS) 5 MG TABS tablet Take 1 tablet (5 mg total) by mouth 2 (two) times daily. 180 tablet 0   Docusate Calcium (STOOL SOFTENER PO) Take 1 capsule by mouth daily.     DULoxetine (CYMBALTA) 60 MG capsule TAKE 1 CAPSULE BY MOUTH ONCE DAILY 90 capsule 1   furosemide (LASIX) 20 MG tablet Take 1 tablet (20 mg total) by mouth daily. 90 tablet 3   hydrocortisone (ANUSOL-HC) 2.5 % rectal cream Place 1 application rectally daily as needed for hemorrhoids or anal itching. For hemmorhoid 30 g 0   lisinopril (ZESTRIL) 20 MG tablet TAKE 1 TABLET BY MOUTH ONCE DAILY 30 tablet 1   metoprolol succinate (TOPROL-XL) 100 MG 24 hr tablet Take 1 tablet (100 mg total) by mouth 2 (two) times daily. 180 tablet 1   polyethylene glycol (MIRALAX / GLYCOLAX) 17 g packet Take 17 g by mouth daily as needed for mild constipation. 14 each 0   prednisoLONE acetate (PRED FORTE) 1 % ophthalmic suspension Place 1 drop into the left eye daily.      tiZANidine (ZANAFLEX) 2 MG tablet Take 2 mg by mouth daily.     traMADol (ULTRAM) 50 MG tablet Take 50-100 mg by mouth at bedtime as needed.     vitamin B-12 (CYANOCOBALAMIN) 1000 MCG tablet Take  1,000 mcg by mouth daily.     atorvastatin (LIPITOR) 20 MG tablet TAKE 1 TABLET BY MOUTH ONCE DAILY 90 tablet 3   gabapentin (NEURONTIN) 400 MG capsule TAKE 1 CAPSULE BY MOUTH AT BEDTIME AND SECOND DOSE DURING THE DAY AS NEEDED 150 capsule 1   memantine (NAMENDA) 5 MG tablet TAKE 1 TABLET BY MOUTH TWICE DAILY 180 tablet 1   pantoprazole (PROTONIX) 40 MG tablet TAKE 1 TABLET BY MOUTH ONCE DAILY 90 tablet 1   rOPINIRole (REQUIP) 1 MG tablet TAKE 2 AND 1/2 TABLETS BY MOUTH AT BEDTIME 225 tablet 0   Vitamin D, Ergocalciferol, (DRISDOL) 1.25 MG (50000 UNIT) CAPS capsule TAKE 1 CAPSULE BY  MOUTH ONCE WEEKLY (EVERY 7 DAYS) 12 capsule 1   doxycycline (VIBRA-TABS) 100 MG tablet Take 1 tablet (100 mg total) by mouth 2 (two) times daily for 14 days. 28 tablet 0   No facility-administered medications prior to visit.     Per HPI unless specifically indicated in ROS section below Review of Systems  Objective:  BP 140/84   Pulse 84   Temp 97.7 F (36.5 C) (Temporal)   Ht '5\' 2"'$  (1.575 m)   Wt 141 lb 2 oz (64 kg)   SpO2 97%   BMI 25.81 kg/m   Wt Readings from Last 3 Encounters:  09/12/21 141 lb 2 oz (64 kg)  09/04/21 140 lb (63.5 kg)  05/30/21 140 lb (63.5 kg)      Physical Exam Vitals and nursing note reviewed.  Constitutional:      Appearance: Normal appearance. She is not ill-appearing.  HENT:     Head: Normocephalic and atraumatic.     Right Ear: Tympanic membrane, ear canal and external ear normal. There is no impacted cerumen.     Left Ear: Tympanic membrane, ear canal and external ear normal. There is no impacted cerumen.  Eyes:     General:        Right eye: No discharge.        Left eye: No discharge.     Extraocular Movements: Extraocular movements intact.     Conjunctiva/sclera: Conjunctivae normal.     Pupils: Pupils are equal, round, and reactive to light.  Neck:     Thyroid: No thyroid mass or thyromegaly.  Cardiovascular:     Rate and Rhythm: Normal rate and regular  rhythm.     Pulses: Normal pulses.     Heart sounds: Normal heart sounds. No murmur heard. Pulmonary:     Effort: Pulmonary effort is normal. No respiratory distress.     Breath sounds: Normal breath sounds. No wheezing, rhonchi or rales.  Abdominal:     General: Bowel sounds are normal. There is no distension.     Palpations: Abdomen is soft. There is no mass.     Tenderness: There is no abdominal tenderness. There is no guarding or rebound.     Hernia: No hernia is present.  Musculoskeletal:     Cervical back: Normal range of motion and neck supple. No rigidity.     Right lower leg: No edema.     Left lower leg: No edema.  Lymphadenopathy:     Cervical: No cervical adenopathy.  Skin:    General: Skin is warm and dry.     Findings: No rash.  Neurological:     General: No focal deficit present.     Mental Status: She is alert. Mental status is at baseline.  Psychiatric:        Mood and Affect: Mood normal.        Behavior: Behavior normal.       Results for orders placed or performed in visit on 04/16/30  Basic Metabolic Panel  Result Value Ref Range   Sodium 143 135 - 145 mEq/L   Potassium 4.2 3.5 - 5.1 mEq/L   Chloride 102 96 - 112 mEq/L   CO2 31 19 - 32 mEq/L   Glucose, Bld 94 70 - 99 mg/dL   BUN 14 6 - 23 mg/dL   Creatinine, Ser 1.03 0.40 - 1.20 mg/dL   GFR 47.96 (L) >60.00 mL/min   Calcium 9.6 8.4 - 10.5 mg/dL   *Note: Due to  a large number of results and/or encounters for the requested time period, some results have not been displayed. A complete set of results can be found in Results Review.      08/27/2021    1:18 PM 01/30/2021    3:30 PM 06/21/2020    3:06 PM 04/28/2019   12:18 PM 04/22/2018   12:44 PM  Depression screen PHQ 2/9  Decreased Interest 0 1 3 0 0  Down, Depressed, Hopeless 0 1 1 0 0  PHQ - 2 Score 0 2 4 0 0  Altered sleeping  0 3 0 0  Tired, decreased energy  1 3 0 0  Change in appetite  0 3 0 0  Feeling bad or failure about yourself   0 0 0  0  Trouble concentrating  0 1 0 0  Moving slowly or fidgety/restless  0 1 0 0  Suicidal thoughts  0 0 0 0  PHQ-9 Score  3 15 0 0  Difficult doing work/chores    Not difficult at all Not difficult at all       06/21/2020    3:07 PM  GAD 7 : Generalized Anxiety Score  Nervous, Anxious, on Edge 2  Control/stop worrying 3  Worry too much - different things 3  Trouble relaxing 3  Restless 2  Easily annoyed or irritable 2  Afraid - awful might happen 1  Total GAD 7 Score 16   Assessment & Plan:   Problem List Items Addressed This Visit     GERD    Stable period on pantoprazole '40mg'$  daily.       Relevant Medications   pantoprazole (PROTONIX) 40 MG tablet   Health maintenance examination - Primary (Chronic)    Preventative protocols reviewed and updated unless pt declined. Discussed healthy diet and lifestyle.       Encounter for chronic pain management (Chronic)    Has tramadol prn, rare use.       HLD (hyperlipidemia)    Update FLP on atorvastatin  The ASCVD Risk score (Arnett DK, et al., 2019) failed to calculate for the following reasons:   The 2019 ASCVD risk score is only valid for ages 61 to 76       Relevant Medications   atorvastatin (LIPITOR) 20 MG tablet   Other Relevant Orders   Lipid panel   TSH   Comprehensive metabolic panel   MDD (major depressive disorder), recurrent episode (HCC)    Chronic, continue duloxetine '60mg'$  daily.       Essential hypertension    Chronic, adequate based on average readings at home - continue current regimen per cardiology, recently metoprolol increased and amlodipine stopped.       Relevant Medications   atorvastatin (LIPITOR) 20 MG tablet   Osteoporosis    Latest DEXA 2021 showing osteopenia (T -2.4 at L forearm), but elevated fracture risk persists. Continue prolia, most recent injection today.       Relevant Medications   Vitamin D, Ergocalciferol, (DRISDOL) 1.25 MG (50000 UNIT) CAPS capsule   RLS (restless legs  syndrome)    Continue requip with nightly gabapentin.       Relevant Orders   Ferritin   IBC panel   CBC with Differential/Platelet   Persistent atrial fibrillation [facility])    Appreciate cardiology care. Rate controlled, continues eliquis and metoprolol.       Relevant Medications   atorvastatin (LIPITOR) 20 MG tablet   Chronic leg pain   Relevant Medications  gabapentin (NEURONTIN) 400 MG capsule   Abdominal aortic atherosclerosis (HCC)    Continue statin.       Relevant Medications   atorvastatin (LIPITOR) 20 MG tablet   Fall with injury    Continued falls noted despite use of rollator. Encouraged life alert use.       General unsteadiness   Peripheral neuropathy    Continues gabapentin '400mg'$  nightly and ALA supplementation .      Relevant Medications   memantine (NAMENDA) 5 MG tablet   gabapentin (NEURONTIN) 400 MG capsule   rOPINIRole (REQUIP) 1 MG tablet   Vitamin D deficiency    Continue weekly replacement.       Relevant Orders   VITAMIN D 25 Hydroxy (Vit-D Deficiency, Fractures)   Vitamin B12 deficiency    Continue daily oral replacement       Relevant Orders   Vitamin B12   PAD (peripheral artery disease) (HCC)    Appreciate [facility] care.       Relevant Medications   atorvastatin (LIPITOR) 20 MG tablet   Ulcer of right foot ([place])    Followed by podiatry.       CKD (chronic kidney disease) stage 3, GFR 30-59 ml/min (HCC)    Update renal panel       Relevant Orders   Comprehensive metabolic panel   CBC with Differential/Platelet   Chronic diastolic CHF (congestive heart failure) (HCC)    Seems euvolemic.       Relevant Medications   atorvastatin (LIPITOR) 20 MG tablet   MCI (mild cognitive impairment) with memory loss    Noted ongoing difficulty.  Will slowly titrate namenda - to 5/'10mg'$  daily.  RTC 4 mo f/u, consider MMSE at that time.         Meds ordered this encounter  Medications   atorvastatin (LIPITOR) 20 MG tablet    Sig: Take  1 tablet (20 mg total) by mouth daily.    Dispense:  90 tablet    Refill:  3   memantine (NAMENDA) 5 MG tablet    Sig: Take 1 tablet (5 mg total) by mouth daily AND 2 tablets (10 mg total) at bedtime.    Dispense:  270 tablet    Refill:  1   denosumab (PROLIA) injection 60 mg    Order Specific Question:   Patient is enrolled in REMS program for this medication and I have provided a copy of the Prolia Medication Guide and Patient Brochure.    Answer:   No    Order Specific Question:   I have reviewed with the patient the information in the Prolia Medication Guide and Patient Counseling Chart including the serious risks of Prolia and symptoms of each risk.    Answer:   No    Order Specific Question:   I have advised the patient to seek medical attention if they have signs or symptoms of any of the serious risks.    Answer:   No   gabapentin (NEURONTIN) 400 MG capsule    Sig: TAKE 1 CAPSULE BY MOUTH AT BEDTIME AND SECOND DOSE DURING THE DAY AS NEEDED    Dispense:  120 capsule    Refill:  3   pantoprazole (PROTONIX) 40 MG tablet    Sig: Take 1 tablet (40 mg total) by mouth daily.    Dispense:  90 tablet    Refill:  3   Vitamin D, Ergocalciferol, (DRISDOL) 1.25 MG (50000 UNIT) CAPS capsule    Sig: Take 1  capsule (50,000 Units total) by mouth every 7 (seven) days.    Dispense:  12 capsule    Refill:  3   rOPINIRole (REQUIP) 1 MG tablet    Sig: Take 2.5 tablets (2.5 mg total) by mouth at bedtime.    Dispense:  225 tablet    Refill:  3   Orders Placed This Encounter  Procedures   Vitamin B12   Ferritin   IBC panel   VITAMIN D 25 Hydroxy (Vit-D Deficiency, Fractures)   Lipid panel   TSH   Comprehensive metabolic panel   CBC with Differential/Platelet     Patient instructions: Lab work today.  Prolia injection today.  Increase namenda to '5mg'$  1 tablet in the morning and 2 tablets at night time.  If interested, check with pharmacy about new 2 shot shingles series (shingrix).   Return in 4-6 months for follow up visit   Reviewed 4 core lifestyle modifications to support a healthy mind:  1. Nutritious well balance diet.  2. Regular physical activity routine.  3. Regular mental activity such as reading books, word puzzles, math puzzles, jigsaw puzzles.  4. Social engagement.  Also ensure good blood pressure control, limit alcohol, no smoking.    Follow up plan: Return in about 4 months (around 01/12/2022) for follow up visit.  Ria Bush, MD

## 2021-09-12 NOTE — Patient Instructions (Addendum)
Lab work today then [employer] injection today.  Increase namenda to '5mg'$  1 tablet in the morning and 2 tablets at night time.  If interested, check with pharmacy about new 2 shot shingles series (shingrix).  Return in 4-6 months for follow up visit   Reviewed 4 core lifestyle modifications to support a healthy mind:  1. Nutritious well balance diet.  2. Regular physical activity routine.  3. Regular mental activity such as reading books, word puzzles, math puzzles, jigsaw puzzles.  4. Social engagement.  Also ensure good blood pressure control, limit alcohol, no smoking.    Health Maintenance After Age 43 After age 70, you are at a higher risk for certain long-term diseases and infections as well as injuries from falls. Falls are a major cause of broken bones and head injuries in people who are older than age 67. Getting regular preventive care can help to keep you healthy and well. Preventive care includes getting regular testing and making lifestyle changes as recommended by your health care provider. Talk with your health care provider about: Which screenings and tests you should have. A screening is a test that checks for a disease when you have no symptoms. A diet and exercise plan that is right for you. What should I know about screenings and tests to prevent falls? Screening and testing are the best ways to find a health problem early. Early diagnosis and treatment give you the best chance of managing medical conditions that are common after age 69. Certain conditions and lifestyle choices may make you more likely to have a fall. Your health care provider may recommend: Regular vision checks. Poor vision and conditions such as cataracts can make you more likely to have a fall. If you wear glasses, make sure to get your prescription updated if your vision changes. Medicine review. Work with your health care provider to regularly review all of the medicines you are taking, including  over-the-counter medicines. Ask your health care provider about any side effects that may make you more likely to have a fall. Tell your health care provider if any medicines that you take make you feel dizzy or sleepy. Strength and balance checks. Your health care provider may recommend certain tests to check your strength and balance while standing, walking, or changing positions. Foot health exam. Foot pain and numbness, as well as not wearing proper footwear, can make you more likely to have a fall. Screenings, including: Osteoporosis screening. Osteoporosis is a condition that causes the bones to get weaker and break more easily. Blood pressure screening. Blood pressure changes and medicines to control blood pressure can make you feel dizzy. Depression screening. You may be more likely to have a fall if you have a fear of falling, feel depressed, or feel unable to do activities that you used to do. Alcohol use screening. Using too much alcohol can affect your balance and may make you more likely to have a fall. Follow these instructions at home: Lifestyle Do not drink alcohol if: Your health care provider tells you not to drink. If you drink alcohol: Limit how much you have to: 0-1 drink a day for women. 0-2 drinks a day for men. Know how much alcohol is in your drink. In the [place] one drink equals one 12 oz bottle of beer (355 mL), one 5 oz glass of wine (148 mL), or one 1 oz glass of hard liquor (44 mL). Do not use any products that contain nicotine or tobacco. These products include  cigarettes, chewing tobacco, and vaping devices, such as e-cigarettes. If you need help quitting, ask your health care provider. Activity  Follow a regular exercise program to stay fit. This will help you maintain your balance. Ask your health care provider what types of exercise are appropriate for you. If you need a cane or walker, use it as recommended by your health care provider. Wear supportive shoes  that have nonskid soles. Safety  Remove any tripping hazards, such as rugs, cords, and clutter. Install safety equipment such as grab bars in bathrooms and safety rails on stairs. Keep rooms and walkways well-lit. General instructions Talk with your health care provider about your risks for falling. Tell your health care provider if: You fall. Be sure to tell your health care provider about all falls, even ones that seem minor. You feel dizzy, tiredness (fatigue), or off-balance. Take over-the-counter and prescription medicines only as told by your health care provider. These include supplements. Eat a healthy diet and maintain a healthy weight. A healthy diet includes low-fat dairy products, low-fat (lean) meats, and fiber from whole grains, beans, and lots of fruits and vegetables. Stay current with your vaccines. Schedule regular health, dental, and eye exams. Summary Having a healthy lifestyle and getting preventive care can help to protect your health and wellness after age 27. Screening and testing are the best way to find a health problem early and help you avoid having a fall. Early diagnosis and treatment give you the best chance for managing medical conditions that are more common for people who are older than age 24. Falls are a major cause of broken bones and head injuries in people who are older than age 100. Take precautions to prevent a fall at home. Work with your health care provider to learn what changes you can make to improve your health and wellness and to prevent falls. This information is not intended to replace advice given to you by your health care provider. Make sure you discuss any questions you have with your health care provider. Document Revised: 08/14/2020 Document Reviewed: 08/14/2020 Elsevier Patient Education  [place].

## 2021-09-13 ENCOUNTER — Ambulatory Visit: Payer: Medicare Other | Admitting: Podiatry

## 2021-09-13 ENCOUNTER — Other Ambulatory Visit: Payer: Medicare Other | Admitting: Student

## 2021-09-13 DIAGNOSIS — I739 Peripheral vascular disease, unspecified: Secondary | ICD-10-CM

## 2021-09-13 DIAGNOSIS — L97512 Non-pressure chronic ulcer of other part of right foot with fat layer exposed: Secondary | ICD-10-CM | POA: Diagnosis not present

## 2021-09-13 LAB — LIPID PANEL
Cholesterol: 148 mg/dL (ref 0–200)
HDL: 52.4 mg/dL (ref >39.00)
LDL Cholesterol: 70 mg/dL (ref 0–99)
NonHDL: 95.11
Total CHOL/HDL Ratio: 3
Triglycerides: 124 mg/dL (ref 0.0–149.0)
VLDL: 24.8 mg/dL (ref 0.0–40.0)

## 2021-09-13 LAB — CBC WITH DIFFERENTIAL/PLATELET
Basophils Absolute: 0.1 10*3/uL (ref 0.0–0.1)
Basophils Relative: 1.2 % (ref 0.0–3.0)
Eosinophils Absolute: 0.1 10*3/uL (ref 0.0–0.7)
Eosinophils Relative: 1.6 % (ref 0.0–5.0)
HCT: 44.4 % (ref 36.0–46.0)
Hemoglobin: 15 g/dL (ref 12.0–15.0)
Lymphocytes Relative: 29.7 % (ref 12.0–46.0)
Lymphs Abs: 2.2 10*3/uL (ref 0.7–4.0)
MCHC: 33.7 g/dL (ref 30.0–36.0)
MCV: 95.9 fl (ref 78.0–100.0)
Monocytes Absolute: 0.7 10*3/uL (ref 0.1–1.0)
Monocytes Relative: 9.1 % (ref 3.0–12.0)
Neutro Abs: 4.3 10*3/uL (ref 1.4–7.7)
Neutrophils Relative %: 58.4 % (ref 43.0–77.0)
Platelets: 228 10*3/uL (ref 150.0–400.0)
RBC: 4.63 Mil/uL (ref 3.87–5.11)
RDW: 14 % (ref 11.5–15.5)
WBC: 7.3 10*3/uL (ref 4.0–10.5)

## 2021-09-13 LAB — IBC PANEL
Iron: 102 ug/dL (ref 42–145)
Saturation Ratios: 33.6 % (ref 20.0–50.0)
TIBC: 303.8 ug/dL (ref 250.0–450.0)
Transferrin: 217 mg/dL (ref 212.0–360.0)

## 2021-09-13 LAB — COMPREHENSIVE METABOLIC PANEL
ALT: 10 U/L (ref 0–35)
AST: 15 U/L (ref 0–37)
Albumin: 4 g/dL (ref 3.5–5.2)
Alkaline Phosphatase: 70 U/L (ref 39–117)
BUN: 20 mg/dL (ref 6–23)
CO2: 32 mEq/L (ref 19–32)
Calcium: 9.7 mg/dL (ref 8.4–10.5)
Chloride: 98 mEq/L (ref 96–112)
Creatinine, Ser: 1.24 mg/dL — ABNORMAL HIGH (ref 0.40–1.20)
GFR: 38.38 mL/min — ABNORMAL LOW (ref >60.00)
Glucose, Bld: 90 mg/dL (ref 70–99)
Potassium: 3.8 mEq/L (ref 3.5–5.1)
Sodium: 139 mEq/L (ref 135–145)
Total Bilirubin: 0.7 mg/dL (ref 0.2–1.2)
Total Protein: 6.9 g/dL (ref 6.0–8.3)

## 2021-09-13 LAB — FERRITIN: Ferritin: 47 ng/mL (ref 10.0–291.0)

## 2021-09-13 LAB — VITAMIN D 25 HYDROXY (VIT D DEFICIENCY, FRACTURES): VITD: 93.91 ng/mL (ref 30.00–100.00)

## 2021-09-13 LAB — VITAMIN B12: Vitamin B-12: >1504 pg/mL — ABNORMAL HIGH (ref 211–911)

## 2021-09-13 LAB — TSH: TSH: 2.34 u[IU]/mL (ref 0.35–5.50)

## 2021-09-13 MED ORDER — GABAPENTIN 400 MG PO CAPS
ORAL_CAPSULE | ORAL | 3 refills | Status: DC
Start: 2021-09-13 — End: 2022-07-09

## 2021-09-13 MED ORDER — PANTOPRAZOLE SODIUM 40 MG PO TBEC: 40.0000 mg | DELAYED_RELEASE_TABLET | Freq: Every day | ORAL | 3 refills | Status: DC

## 2021-09-13 MED ORDER — ROPINIROLE HCL 1 MG PO TABS: 2.5000 mg | ORAL_TABLET | Freq: Every day | ORAL | 3 refills | Status: DC

## 2021-09-13 MED ORDER — VITAMIN D (ERGOCALCIFEROL) 1.25 MG (50000 UNIT) PO CAPS: 50000.0000 [IU] | ORAL_CAPSULE | ORAL | 3 refills | Status: DC

## 2021-09-13 NOTE — Assessment & Plan Note (Signed)
Preventative protocols reviewed and updated unless pt declined. Discussed healthy diet and lifestyle.  

## 2021-09-13 NOTE — Assessment & Plan Note (Signed)
Continue weekly replacement.  

## 2021-09-13 NOTE — Assessment & Plan Note (Signed)
Continue daily oral replacement

## 2021-09-13 NOTE — Assessment & Plan Note (Signed)
Appreciate vvs care.

## 2021-09-13 NOTE — Assessment & Plan Note (Signed)
Chronic, continue duloxetine '60mg'$  daily.

## 2021-09-13 NOTE — Assessment & Plan Note (Signed)
Stable period on pantoprazole 40mg daily  

## 2021-09-13 NOTE — Assessment & Plan Note (Signed)
Continues gabapentin '400mg'$  nightly and ALA supplementation .

## 2021-09-13 NOTE — Assessment & Plan Note (Signed)
Latest DEXA 2021 showing osteopenia (T -2.4 at L forearm), but elevated fracture risk persists. Continue prolia, most recent injection today.

## 2021-09-13 NOTE — Assessment & Plan Note (Signed)
Seems euvolemic.  

## 2021-09-13 NOTE — Assessment & Plan Note (Signed)
Noted ongoing difficulty.  Will slowly titrate namenda - to 5/'10mg'$  daily.  RTC 4 mo f/u, consider MMSE at that time.

## 2021-09-13 NOTE — Assessment & Plan Note (Signed)
Continue statin. 

## 2021-09-13 NOTE — Assessment & Plan Note (Signed)
Update FLP on atorvastatin  The ASCVD Risk score (Arnett DK, et al., 2019) failed to calculate for the following reasons:   The 2019 ASCVD risk score is only valid for ages 32 to 61

## 2021-09-13 NOTE — Assessment & Plan Note (Signed)
Chronic, adequate based on average readings at home - continue current regimen per cardiology, recently metoprolol increased and amlodipine stopped.

## 2021-09-13 NOTE — Assessment & Plan Note (Signed)
Continue requip with nightly gabapentin.

## 2021-09-13 NOTE — Assessment & Plan Note (Signed)
Followed by podiatry 

## 2021-09-13 NOTE — Assessment & Plan Note (Signed)
Update renal panel 

## 2021-09-13 NOTE — Assessment & Plan Note (Addendum)
Appreciate cardiology care. Rate controlled, continues eliquis and metoprolol.

## 2021-09-13 NOTE — Assessment & Plan Note (Addendum)
Continued falls noted despite use of rollator. Encouraged life alert use.

## 2021-09-13 NOTE — Assessment & Plan Note (Addendum)
Has tramadol prn, rare use.

## 2021-09-17 ENCOUNTER — Other Ambulatory Visit: Payer: Self-pay | Admitting: Family Medicine

## 2021-09-18 NOTE — Progress Notes (Signed)
Subjective:  Patient ID: [name], female    DOB: 01-08-1930,  MRN: [id number]  Chief Complaint  Patient presents with   Foot Ulcer     Right foot ulcer    88 y.o. female presents with the above complaint.  Patient presents with follow-up of right lateral fifth MPJ wound.  She states she is doing well.  She states is doing a lot better.  She denies any other acute complaints.  Review of Systems: Negative except as noted in the HPI. Denies N/V/F/Ch.  Past Medical History:  Diagnosis Date   Abdominal aortic atherosclerosis ([place]) 09/2015   by xray   Anxiety    BCC (basal cell carcinoma of skin) 05/2011   on nose   BCC (basal cell carcinoma of skin) 08/09/2020   upper chest, [facility] 12/27/20   Breast lesion    a. diagnosed with complex sclerosing lesion with calcifications of the right breast in 08/2016   Chest pain    a. nuclear stress test 03/2012: normal; b. 09/2018 MV: EF >65%, no isch/scar.    DDD (degenerative disc disease), lumbosacral 12/06/1992   Gallstones 09/2015   incidentally by xray   GERD (gastroesophageal reflux disease) 05/1999   HERPES ZOSTER 04/13/2007   Qualifier: Diagnosis of  By: [name], [name]    Hiatal hernia    History of shingles    ophthalmic, takes acyclovir daily   Hyperlipidemia 12/2003   Hypertension 1990   Mitral regurgitation    a. echo 03/2012: EF 60-65%, no RWMA, mild to mod AI/MR, mod TR, PASP 37 mmHg   Osteoarthritis 08/21/1989   Osteoporosis with fracture 11/1997   with compression fracture T12   Persistent atrial fibrillation ([place]) 03/16/2012   a. CHADS2VASc => 5 (HTN, age x 2, vascular disease, sex category)-->Eliquis 5 BID;  b. Recurrent AF in 06/2016;  c. 01/2017 s/p DCCV;  d. 02/2017 recurrent AFib->Amio added->DCCV; e. 03/2017 recurrent AFib, s/p reload of amio and DCCV 04/02/2017   POSTHERPETIC NEURALGIA 04/20/2007   Qualifier: Diagnosis of  By: [name], Billie-Lynn Daniels    Rheumatoid arthritis ([place])    RLS  (restless legs syndrome)    SCC (squamous cell carcinoma) 05/01/2021   right posterior flank inferior to axilla - well differentiated squamous cell carcinoma with superificial infiltration, base involved. [facility] 06/06/2021    Current Outpatient Medications:    acetaminophen (TYLENOL) 500 MG tablet, Take 500 mg by mouth every 6 (six) hours as needed for mild pain., Disp: , Rfl:    acyclovir (ZOVIRAX) 400 MG tablet, Take 400 mg by mouth 2 (two) times daily., Disp: , Rfl:    Alpha-Lipoic Acid 600 MG TABS, Take 600 mg by mouth daily., Disp: , Rfl:    apixaban (ELIQUIS) 5 MG TABS tablet, Take 1 tablet (5 mg total) by mouth 2 (two) times daily., Disp: 180 tablet, Rfl: 0   atorvastatin (LIPITOR) 20 MG tablet, Take 1 tablet (20 mg total) by mouth daily., Disp: 90 tablet, Rfl: 3   Docusate Calcium (STOOL SOFTENER PO), Take 1 capsule by mouth daily., Disp: , Rfl:    DULoxetine (CYMBALTA) 60 MG capsule, TAKE 1 CAPSULE BY MOUTH ONCE DAILY, Disp: 90 capsule, Rfl: 1   furosemide (LASIX) 20 MG tablet, Take 1 tablet (20 mg total) by mouth daily., Disp: 90 tablet, Rfl: 3   gabapentin (NEURONTIN) 400 MG capsule, TAKE 1 CAPSULE BY MOUTH AT BEDTIME AND SECOND DOSE DURING THE DAY AS NEEDED, Disp: 120 capsule, Rfl: 3   hydrocortisone (  ANUSOL-HC) 2.5 % rectal cream, Place 1 application rectally daily as needed for hemorrhoids or anal itching. For hemmorhoid, Disp: 30 g, Rfl: 0   lisinopril (ZESTRIL) 20 MG tablet, TAKE 1 TABLET BY MOUTH ONCE DAILY, Disp: 30 tablet, Rfl: 1   memantine (NAMENDA) 5 MG tablet, Take 1 tablet (5 mg total) by mouth daily AND 2 tablets (10 mg total) at bedtime., Disp: 270 tablet, Rfl: 1   metoprolol succinate (TOPROL-XL) 100 MG 24 hr tablet, Take 1 tablet (100 mg total) by mouth 2 (two) times daily., Disp: 180 tablet, Rfl: 1   pantoprazole (PROTONIX) 40 MG tablet, Take 1 tablet (40 mg total) by mouth daily., Disp: 90 tablet, Rfl: 3   polyethylene glycol (MIRALAX / GLYCOLAX) 17 g packet, Take 17 g by  mouth daily as needed for mild constipation., Disp: 14 each, Rfl: 0   prednisoLONE acetate (PRED FORTE) 1 % ophthalmic suspension, Place 1 drop into the left eye daily. , Disp: , Rfl:    rOPINIRole (REQUIP) 1 MG tablet, Take 2.5 tablets (2.5 mg total) by mouth at bedtime., Disp: 225 tablet, Rfl: 3   tiZANidine (ZANAFLEX) 2 MG tablet, Take 2 mg by mouth daily., Disp: , Rfl:    traMADol (ULTRAM) 50 MG tablet, Take 50-100 mg by mouth at bedtime as needed., Disp: , Rfl:    Vitamin D, Ergocalciferol, (DRISDOL) 1.25 MG (50000 UNIT) CAPS capsule, Take 1 capsule (50,000 Units total) by mouth every 7 (seven) days., Disp: 12 capsule, Rfl: 3  Social History   Tobacco Use  Smoking Status Former   Packs/day: 0.25   Years: 1.00   Total pack years: 0.25   Types: Cigarettes   Quit date: 04/08/1976   Years since quitting: 45.4  Smokeless Tobacco Never  Tobacco Comments   pt smokes occasionally "socially"    Allergies  Allergen Reactions   Penicillins Rash and Other (See Comments)    Childhood Allergy Has patient had a PCN reaction causing immediate rash, facial/tongue/throat swelling, SOB or lightheadedness with hypotension: Yes Has patient had a PCN reaction causing severe rash involving mucus membranes or skin necrosis: Unknown Has patient had a PCN reaction that required hospitalization: No Has patient had a PCN reaction occurring within the last 10 years: No If all of the above answers are "NO", then may proceed with Cephalosporin use.    Objective:  There were no vitals filed for this visit. There is no height or weight on file to calculate BMI. Constitutional Well developed. Well nourished.  Vascular Dorsalis pedis pulses palpable bilaterally. Posterior tibial pulses palpable bilaterally. Capillary refill normal to all digits.  No cyanosis or clubbing noted. Pedal hair growth normal.  Neurologic Normal speech. Oriented to person, place, and time. Epicritic sensation to light touch  grossly present bilaterally.  Dermatologic No further right fifth metatarsal phalangeal joint lateral wound.  No further probing down to subcutaneous tissue no further purulent drainage noted no redness noted.  No erythema noted.  No malodor present   Orthopedic: Normal joint ROM without pain or crepitus bilaterally. No visible deformities. No bony tenderness.   Radiographs: None Assessment:   No diagnosis found.   Plan:  Patient was evaluated and treated and all questions answered.  Right lateral fifth metatarsal MPJ wound -All questions and concerns were discussed with the patient in extensive detail -Clinically the wound has reepithelialized.  No signs of infection noted.  I encouraged her to continue wearing wider shoes and making shoe gear modification if it reopens again  I have asked her to come back and see me.  She states understanding.

## 2021-09-20 DIAGNOSIS — M5416 Radiculopathy, lumbar region: Secondary | ICD-10-CM | POA: Diagnosis not present

## 2021-09-20 DIAGNOSIS — M48062 Spinal stenosis, lumbar region with neurogenic claudication: Secondary | ICD-10-CM | POA: Diagnosis not present

## 2021-09-20 DIAGNOSIS — M62830 Muscle spasm of back: Secondary | ICD-10-CM | POA: Diagnosis not present

## 2021-09-20 DIAGNOSIS — M5136 Other intervertebral disc degeneration, lumbar region: Secondary | ICD-10-CM | POA: Diagnosis not present

## 2021-09-20 DIAGNOSIS — B0233 Zoster keratitis: Secondary | ICD-10-CM | POA: Diagnosis not present

## 2021-09-20 DIAGNOSIS — Z961 Presence of intraocular lens: Secondary | ICD-10-CM | POA: Diagnosis not present

## 2021-10-02 ENCOUNTER — Other Ambulatory Visit: Payer: Medicare Other | Admitting: Student

## 2021-10-02 ENCOUNTER — Other Ambulatory Visit (INDEPENDENT_AMBULATORY_CARE_PROVIDER_SITE_OTHER): Payer: Self-pay | Admitting: Vascular Surgery

## 2021-10-02 DIAGNOSIS — I5032 Chronic diastolic (congestive) heart failure: Secondary | ICD-10-CM

## 2021-10-02 DIAGNOSIS — L97519 Non-pressure chronic ulcer of other part of right foot with unspecified severity: Secondary | ICD-10-CM | POA: Diagnosis not present

## 2021-10-02 DIAGNOSIS — Z515 Encounter for palliative care: Secondary | ICD-10-CM | POA: Diagnosis not present

## 2021-10-02 DIAGNOSIS — I739 Peripheral vascular disease, unspecified: Secondary | ICD-10-CM

## 2021-10-02 DIAGNOSIS — G2581 Restless legs syndrome: Secondary | ICD-10-CM | POA: Diagnosis not present

## 2021-10-02 NOTE — Progress Notes (Signed)
[facility] Consult Note Telephone: [phone]  Fax: [phone]    Date of encounter: 10/02/21 2:02 PM PATIENT NAME: [name] [address] [place] [address]   [phone] (home)  DOB: July 26, 1929 MRN: [id number] PRIMARY CARE PROVIDER:    Ria Bush, MD,  [place] [address] [phone]  REFERRING PROVIDER:   Ria Bush, [place],  [place] [address] [phone]  RESPONSIBLE PARTY:    Contact Information     Name Relation Home Work [place] Daughter [phone]     [name] Daughter [phone]  [phone]   [name] Daughter [phone]  [phone]        I met face to face with patient and family in the home. Palliative Care was asked to follow this patient by consultation request of  Ria Bush, MD to address advance care planning and complex medical decision making. This is a follow up visit.                                   ASSESSMENT AND PLAN / RECOMMENDATIONS:   Advance Care Planning/Goals of Care: Goals include to maximize quality of life and symptom management. Patient/health care surrogate gave his/her permission to discuss. Our advance care planning conversation included a discussion about:    The value and importance of advance care planning  Experiences with loved ones who have been seriously ill or have died  Exploration of personal, cultural or spiritual beliefs that might influence medical decisions  Exploration of goals of care in the event of a sudden injury or illness  CODE STATUS: DNR  Palliative Medicine will continue to provide ongoing support, symptom management.   Symptom Management/Plan:  Right lateral foot-patient with recurrent wounds to area. Currently with pinpoint black area; left open to air. Monitoring area for any changes, erythema; she is to follow up with  podiatry as needed.  Heart failure-stable; endorses shortness of breath with exertion. LE edema has been better, she is keeping her feet elevated. Continue furosemide, metoprolol as directed. Continue daily weights.   Restless leg syndrome- continue Requip, gabapentin QHS.   Follow up Palliative Care Visit: Palliative care will continue to follow for complex medical decision making, advance care planning, and clarification of goals. Return in 8 weeks or prn.   This visit was coded based on medical decision making (MDM).  PPS: 40%  HOSPICE ELIGIBILITY/DIAGNOSIS: TBD  Chief Complaint: Palliative Medicine follow up visit.   HISTORY OF PRESENT ILLNESS:  [name] is a 88 y.o. year old female  with PVD, chronic ulcer to right foot, osteomyelitis, chronic diastolic heart failure, mild dementia, polyneuropathy, RLS, hypertension, atrial fibrillation, hyperlipidemia, CKD stage 3.     Patient reports doing well. She has a caregiver coming 9-1 pm five days a week. Requires assist with bathing. Using walker for ambulation. She does endorse shortness of breath with exertion; no LE edema. Sleeping well at night; taking Requip for restless leg. Has a pinpoint area to right lateral foot, left open to air. No recent infections. A 10-point ROS is negative, except for the pertinent positives and negatives detailed per the HPI.   History obtained from review of EMR, discussion with primary team, and interview with family, facility staff/caregiver and/or [name].  I reviewed available labs, medications, imaging, studies and related documents from the EMR.  Records reviewed and summarized above.    Physical Exam: Pulse 68 apical, irregular, resp 16, b/p 148/90, sats 95% Constitutional: NAD General: frail appearing EYES: anicteric sclera, lids intact, no discharge  ENMT: intact hearing, oral mucous membranes moist, dentition intact CV: S1S2, no LE edema Pulmonary: LCTA, no increased work of  breathing, no cough, room air Abdomen:  normo-active BS + 4 quadrants, soft and non tender GU: deferred MSK: moves all extremities, ambulatory Skin: warm and dry, no rashes or wounds on visible skin Neuro:  no generalized weakness,  A & O x 3, forgetful Psych: non-anxious affect Hem/lymph/immuno: no widespread bruising   Thank you for the opportunity to participate in the care of [name].  The palliative care team will continue to follow. Please call our office at [phone] if we can be of additional assistance.   Ezekiel Slocumb, NP   COVID-19 PATIENT SCREENING TOOL Asked and negative response unless otherwise noted:   Have you had symptoms of covid, tested positive or been in contact with someone with symptoms/positive test in the past 5-10 days? No

## 2021-10-03 ENCOUNTER — Other Ambulatory Visit: Payer: Self-pay | Admitting: Family Medicine

## 2021-10-03 ENCOUNTER — Ambulatory Visit (INDEPENDENT_AMBULATORY_CARE_PROVIDER_SITE_OTHER): Payer: Medicare Other

## 2021-10-03 ENCOUNTER — Ambulatory Visit (INDEPENDENT_AMBULATORY_CARE_PROVIDER_SITE_OTHER): Payer: Medicare Other | Admitting: Nurse Practitioner

## 2021-10-03 ENCOUNTER — Encounter (INDEPENDENT_AMBULATORY_CARE_PROVIDER_SITE_OTHER): Payer: Self-pay | Admitting: Nurse Practitioner

## 2021-10-03 VITALS — BP 145/86 | HR 75 | Resp 16

## 2021-10-03 DIAGNOSIS — Z9889 Other specified postprocedural states: Secondary | ICD-10-CM | POA: Diagnosis not present

## 2021-10-03 DIAGNOSIS — I10 Essential (primary) hypertension: Secondary | ICD-10-CM

## 2021-10-03 DIAGNOSIS — Z1231 Encounter for screening mammogram for malignant neoplasm of breast: Secondary | ICD-10-CM

## 2021-10-03 DIAGNOSIS — E782 Mixed hyperlipidemia: Secondary | ICD-10-CM | POA: Diagnosis not present

## 2021-10-03 DIAGNOSIS — I739 Peripheral vascular disease, unspecified: Secondary | ICD-10-CM

## 2021-10-04 ENCOUNTER — Telehealth: Payer: Self-pay | Admitting: Podiatry

## 2021-10-04 MED ORDER — DOXYCYCLINE HYCLATE 100 MG PO TABS: 100.0000 mg | ORAL_TABLET | Freq: Two times a day (BID) | ORAL | 0 refills | Status: DC

## 2021-10-04 NOTE — Telephone Encounter (Signed)
Patient  daughter called  the wound on her R foot has opened again , its is draining globs of white pus out of it, what do you recommend that they do?  Patient left message on voicemail for triage nurse .

## 2021-10-08 ENCOUNTER — Telehealth: Payer: Self-pay | Admitting: Cardiovascular Disease

## 2021-10-08 NOTE — Telephone Encounter (Signed)
BP readings placed on Dr. Tyrell Antonio desk for him to review.

## 2021-10-08 NOTE — Telephone Encounter (Signed)
Patient daughter came by  Dropped off BP readings to be reviewed  Placed in nurse box

## 2021-10-10 ENCOUNTER — Other Ambulatory Visit: Payer: Self-pay | Admitting: Cardiovascular Disease

## 2021-10-11 MED ORDER — AMLODIPINE BESYLATE 2.5 MG PO TABS
2.5000 mg | ORAL_TABLET | Freq: Every day | ORAL | 1 refills | Status: DC
Start: 2021-10-11 — End: 2022-04-24

## 2021-10-11 NOTE — Telephone Encounter (Signed)
Patients daughter [name] made aware of Dr. Tyrell Antonio recommendation. Pt will resume Amlodipine 2.5 mg daily. Teresa Pelton to continue to monitor the pt BP and HR and to let us know if the readings are consistently elevated. Pt daughter verbalized understanding and voiced appreciation for the call.

## 2021-10-11 NOTE — Telephone Encounter (Signed)
-----   Message from Wellington Hampshire, MD sent at 10/11/2021  1:34 PM EDT ----- I reviewed her blood pressure readings and they seem to be running high.  Heart rate improved with increasing metoprolol.  I recommend resuming amlodipine 2.5 mg once daily.  The daughter asked about appropriate diet.  I recommend low-sodium diet.

## 2021-10-18 ENCOUNTER — Ambulatory Visit: Payer: Medicare Other | Admitting: Cardiovascular Disease

## 2021-10-20 ENCOUNTER — Encounter (INDEPENDENT_AMBULATORY_CARE_PROVIDER_SITE_OTHER): Payer: Self-pay | Admitting: Nurse Practitioner

## 2021-10-20 NOTE — Progress Notes (Signed)
Subjective:    Patient ID: [name], female    DOB: June 02, 1929, 88 y.o.   MRN: [id number] Chief Complaint  Patient presents with   Follow-up    Ultrasound follow up    The patient returns to the office for followup and review of the noninvasive studies.   There have been no interval changes in lower extremity symptoms. No interval shortening of the patient's claudication distance or development of rest pain symptoms. No new ulcers or wounds have occurred since the last visit.  There have been no significant changes to the patient's overall health care.  The patient denies amaurosis fugax or recent TIA symptoms. There are no documented recent neurological changes noted. There is no history of DVT, PE or superficial thrombophlebitis. The patient denies recent episodes of angina or shortness of breath.   ABI Rt=1.04 and Lt=1.36  (previous ABI's Rt=1.18 and Lt=1.18) Duplex ultrasound of the bilateral tibial artery reveals triphasic/biphasic waveforms.  Left lower extremity has diminished TBI's with reduced toe waveforms however.    Review of Systems  Skin:  Positive for color change.  All other systems reviewed and are negative.      Objective:   Physical Exam Vitals reviewed.  HENT:     Head: Normocephalic.  Cardiovascular:     Rate and Rhythm: Normal rate.     Pulses:          Dorsalis pedis pulses are detected w/ Doppler on the right side and detected w/ Doppler on the left side.       Posterior tibial pulses are detected w/ Doppler on the right side and detected w/ Doppler on the left side.  Pulmonary:     Effort: Pulmonary effort is normal.  Skin:    General: Skin is warm and dry.  Neurological:     Mental Status: She is alert and oriented to person, place, and time.  Psychiatric:        Mood and Affect: Mood normal.        Behavior: Behavior normal.        Thought Content: Thought content normal.        Judgment: Judgment normal.     BP (!) 145/86 (BP  Location: Left Arm)   Pulse 75   Resp 16   Past Medical History:  Diagnosis Date   Abdominal aortic atherosclerosis ([place]) 09/2015   by xray   Anxiety    BCC (basal cell carcinoma of skin) 05/2011   on nose   BCC (basal cell carcinoma of skin) 08/09/2020   upper chest, [facility] 12/27/20   Breast lesion    a. diagnosed with complex sclerosing lesion with calcifications of the right breast in 08/2016   Chest pain    a. nuclear stress test 03/2012: normal; b. 09/2018 MV: EF >65%, no isch/scar.    DDD (degenerative disc disease), lumbosacral 12/06/1992   Gallstones 09/2015   incidentally by xray   GERD (gastroesophageal reflux disease) 05/1999   HERPES ZOSTER 04/13/2007   Qualifier: Diagnosis of  By: [name], [name]    Hiatal hernia    History of shingles    ophthalmic, takes acyclovir daily   Hyperlipidemia 12/2003   Hypertension 1990   Mitral regurgitation    a. echo 03/2012: EF 60-65%, no RWMA, mild to mod AI/MR, mod TR, PASP 37 mmHg   Osteoarthritis 08/21/1989   Osteoporosis with fracture 11/1997   with compression fracture T12   Persistent atrial fibrillation ([place]) 03/16/2012  a. CHADS2VASc => 5 (HTN, age x 2, vascular disease, sex category)-->Eliquis 5 BID;  b. Recurrent AF in 06/2016;  c. 01/2017 s/p DCCV;  d. 02/2017 recurrent AFib->Amio added->DCCV; e. 03/2017 recurrent AFib, s/p reload of amio and DCCV 04/02/2017   POSTHERPETIC NEURALGIA 04/20/2007   Qualifier: Diagnosis of  By: [name], Billie-Lynn Daniels    Rheumatoid arthritis ([place])    RLS (restless legs syndrome)    SCC (squamous cell carcinoma) 05/01/2021   right posterior flank inferior to axilla - well differentiated squamous cell carcinoma with superificial infiltration, base involved. [facility] 06/06/2021    Social History   Socioeconomic History   Marital status: Widowed    Spouse name: Not on file   Number of children: 5   Years of education: Not on file   Highest education level: Not on file   Occupational History   Occupation: Retired - Occupational psychologist: RETIRED  Tobacco Use   Smoking status: Former    Packs/day: 0.25    Years: 1.00    Total pack years: 0.25    Types: Cigarettes    Quit date: 04/08/1976    Years since quitting: 45.5   Smokeless tobacco: Never   Tobacco comments:    pt smokes occasionally "socially"  Vaping Use   Vaping Use: Never used  Substance and Sexual Activity   Alcohol use: No    Alcohol/week: 1.0 standard drink of alcohol    Types: 1 Glasses of wine per week   Drug use: No   Sexual activity: Never  Other Topics Concern   Not on file  Social History Narrative   Lives with grandson who smokes, dog   From [place].  Widowed 01/1999; 1st marriage 25 years / 2nd marriage 13 years.   One son deceased Jan 29, 2023.   Activity: active with dog   Diet: good water, fruits/vegetables daily   Social Determinants of Health   Financial Resource Strain: Low Risk  (08/27/2021)   Overall Financial Resource Strain (CARDIA)    Difficulty of Paying Living Expenses: Not very hard  Food Insecurity: No Food Insecurity (08/27/2021)   Hunger Vital Sign    Worried About Running Out of Food in the Last Year: Never true    Ran Out of Food in the Last Year: Never true  Transportation Needs: No Transportation Needs (08/27/2021)   PRAPARE - Hydrologist (Medical): No    Lack of Transportation (Non-Medical): No  Physical Activity: Inactive (08/27/2021)   Exercise Vital Sign    Days of Exercise per Week: 0 days    Minutes of Exercise per Session: 0 min  Stress: No Stress Concern Present (08/27/2021)   [place]    Feeling of Stress : Only a little  Social Connections: Socially Isolated (08/27/2021)   Social Connection and Isolation Panel [NHANES]    Frequency of Communication with Friends and Family: More than three times a week    Frequency of Social Gatherings with Friends  and Family: More than three times a week    Attends Religious Services: Never    Marine scientist or Organizations: No    Attends Archivist Meetings: Never    Marital Status: Widowed  Intimate Partner Violence: Not At Risk (08/27/2021)   Humiliation, Afraid, Rape, and Kick questionnaire    Fear of Current or Ex-Partner: No    Emotionally Abused: No    Physically Abused: No  Sexually Abused: No    Past Surgical History:  Procedure Laterality Date   ABDOMINAL HYSTERECTOMY  Age 41 - 6   S/P TAH   abdominal ultrasound  04/23/2004   Gallstones   BREAST EXCISIONAL BIOPSY Right 2018   neg/benign complex sclerosing lesion   BREAST LUMPECTOMY WITH RADIOACTIVE SEED LOCALIZATION Right 10/2016   breast lumpectomy with radioactive seed localization and margin assessment for complex sclerosing lesion Renelda Loma)   BREAST LUMPECTOMY WITH RADIOACTIVE SEED LOCALIZATION Right 11/05/2016   Procedure: RIGHT BREAST LUMPECTOMY WITH RADIOACTIVE SEED LOCALIZATION;  Surgeon: Fanny Skates, MD;  Location: [place];  Service: General;  Laterality: Right;   CARDIAC CATHETERIZATION     CARDIOVERSION N/A 01/31/2017   Procedure: CARDIOVERSION;  Surgeon: Wellington [place], MD;  Location: [facility];  Service: Cardiovascular;  Laterality: N/A;   CARDIOVERSION N/A 03/03/2017   Procedure: CARDIOVERSION;  Surgeon: Wellington [place], MD;  Location: [facility];  Service: Cardiovascular;  Laterality: N/A;   CARDIOVERSION N/A 04/02/2017   Procedure: CARDIOVERSION;  Surgeon: Wellington [place], MD;  Location: [facility];  Service: Cardiovascular;  Laterality: N/A;   CARDIOVERSION N/A 01/16/2018   Procedure: CARDIOVERSION;  Surgeon: Wellington [place], MD;  Location: [facility];  Service: Cardiovascular;  Laterality: N/A;   CARDIOVERSION N/A 12/25/2020   Procedure: CARDIOVERSION;  Surgeon: Wellington [place], MD;  Location: [facility];  Service: Cardiovascular;  Laterality: N/A;   CATARACT  EXTRACTION  05/2010   left eye   CERVICAL DISCECTOMY  1992   Fusion (Dr. Joya Salm)   ERCP N/A 10/30/2020   Procedure: ENDOSCOPIC RETROGRADE CHOLANGIOPANCREATOGRAPHY (ERCP);  Surgeon: Jackquline Denmark, MD;  Location: [facility]COPY;  Service: Endoscopy;  Laterality: N/A;   ESI Bilateral 01/2016   S1 transforaminal ESI x3   ESOPHAGOGASTRODUODENOSCOPY  01/01/2002   with ulcer, bx. neg; + stricture gastric ulcer with hemorrhage   ESOPHAGOGASTRODUODENOSCOPY  04/23/2004   Esophageal stricture -- dilated   INTRAVASCULAR PRESSURE WIRE/FFR STUDY N/A 07/12/2019   Procedure: INTRAVASCULAR PRESSURE WIRE/FFR STUDY;  Surgeon: Wellington [place], MD;  Location: [place] [facility];  Service: Cardiovascular;  Laterality: N/A;   LOWER EXTREMITY ANGIOGRAPHY Left 02/01/2019   LOWER EXTREMITY ANGIOGRAPHY;  Surgeon: Algernon Huxley, MD   LOWER EXTREMITY ANGIOGRAPHY Right 03/25/2019   Procedure: LOWER EXTREMITY ANGIOGRAPHY;  Surgeon: Algernon Huxley, MD;  Location: [place] [facility];  Service: Cardiovascular;  Laterality: Right;   LOWER EXTREMITY ANGIOGRAPHY Right 08/02/2019   Procedure: LOWER EXTREMITY ANGIOGRAPHY;  Surgeon: Algernon Huxley, MD;  Location: [place] [facility];  Service: Cardiovascular;  Laterality: Right;   nuclear stress test  03/2012   no ischemia   PERIPHERAL VASCULAR BALLOON ANGIOPLASTY Left 01/2019   Percutaneous transluminal angioplasty of left anterior and posterior tibial artery with 2.5 mm diameter by 22 cm length angioplasty balloon ([place])   REMOVAL OF STONES  10/30/2020   Procedure: REMOVAL OF STONES;  Surgeon: Jackquline Denmark, MD;  Location: [facility] END[facility];  Service: Endoscopy;;   RIGHT/LEFT HEART CATH AND CORONARY ANGIOGRAPHY Bilateral 07/12/2019   Procedure: RIGHT/LEFT HEART CATH AND CORONARY ANGIOGRAPHY;  Surgeon: Wellington [place], MD;  Location: [place] [facility];  Service: Cardiovascular;  Laterality: Bilateral;   SKIN CANCER EXCISION  05/20/2011   BCC from nose   SPHINCTEROTOMY  10/30/2020    Procedure: SPHINCTEROTOMY;  Surgeon: Jackquline Denmark, MD;  Location: [facility] ENDOSCOPY;  Service: Endoscopy;;   US ECHOCARDIOGRAPHY  03/2012   in flutter, EF 60%, mild-mod aortic, mitral, tricuspid regurg, mildly dilated  LA    Family History  Problem Relation Age of Onset   Emphysema Mother    Heart disease Father        MI   Stroke Father        first at age 48.   Stroke Sister    Diabetes Sister    Hypertension Brother    Heart disease Brother        Heart Valve   Stroke Brother    Diabetes Brother    Cancer Brother        esophageal   Diabetes Brother    Diabetes Brother    Colon cancer Neg Hx     Allergies  Allergen Reactions   Penicillins Rash and Other (See Comments)    Childhood Allergy Has patient had a PCN reaction causing immediate rash, facial/tongue/throat swelling, SOB or lightheadedness with hypotension: Yes Has patient had a PCN reaction causing severe rash involving mucus membranes or skin necrosis: Unknown Has patient had a PCN reaction that required hospitalization: No Has patient had a PCN reaction occurring within the last 10 years: No If all of the above answers are "NO", then may proceed with Cephalosporin use.        Latest Ref Rng & Units 09/12/2021    4:12 PM 05/30/2021   12:53 PM 01/30/2021    4:01 PM  CBC  WBC 4.0 - 10.5 K/uL 7.3  7.1  6.5   Hemoglobin 12.0 - 15.0 g/dL 15.0  13.5  13.5   Hematocrit 36.0 - 46.0 % 44.4  40.9  40.9   Platelets 150.0 - 400.0 K/uL 228.0  232.0  239.0       CMP     Component Value Date/Time   NA 139 09/12/2021 1612   NA 143 12/21/2020 1546   K 3.8 09/12/2021 1612   CL 98 09/12/2021 1612   CO2 32 09/12/2021 1612   GLUCOSE 90 09/12/2021 1612   BUN 20 09/12/2021 1612   BUN 15 12/21/2020 1546   CREATININE 1.24 (H) 09/12/2021 1612   CREATININE 0.81 01/30/2018 1558   CALCIUM 9.7 09/12/2021 1612   PROT 6.9 09/12/2021 1612   PROT 6.1 01/07/2018 1515   ALBUMIN 4.0 09/12/2021 1612   ALBUMIN 4.1 01/07/2018 1515    AST 15 09/12/2021 1612   ALT 10 09/12/2021 1612   ALKPHOS 70 09/12/2021 1612   BILITOT 0.7 09/12/2021 1612   BILITOT 0.5 01/07/2018 1515   GFRNONAA 57 (L) 11/01/2020 0338   GFRAA 43 (L) 10/27/2019 0452     VAS [place] ABI WITH/WO TBI  Result Date: 10/05/2021  LOWER EXTREMITY DOPPLER STUDY Patient Name:  [name]  Date of Exam:   10/03/2021 Medical Rec #: [id number]        Accession #:    [id number] Date of Birth: 1929-10-07        Patient Gender: F Patient Age:   37 years Exam Location:  [facility] Procedure:      VAS [place] ABI WITH/WO TBI Referring Phys: Leotis Pain --------------------------------------------------------------------------------  Indications: Peripheral artery disease.  Performing Technologist: Almira Coaster RVS  Examination Guidelines: A complete evaluation includes at minimum, Doppler waveform signals and systolic blood pressure reading at the level of bilateral brachial, anterior tibial, and posterior tibial arteries, when vessel segments are accessible. Bilateral testing is considered an integral part of a complete examination. Photoelectric Plethysmograph (PPG) waveforms and toe systolic pressure readings are included as required and additional duplex testing as needed. Limited examinations for  reoccurring indications may be performed as noted.  ABI Findings: +---------+------------------+-----+---------+--------+ Right    Rt Pressure (mmHg)IndexWaveform Comment  +---------+------------------+-----+---------+--------+ Brachial 134                                      +---------+------------------+-----+---------+--------+ ATA      144               1.04 triphasic         +---------+------------------+-----+---------+--------+ PTA      123               0.88 biphasic          +---------+------------------+-----+---------+--------+ Great Toe128               0.92 Normal            +---------+------------------+-----+---------+--------+  +---------+------------------+-----+---------+-------+ Left     Lt Pressure (mmHg)IndexWaveform Comment +---------+------------------+-----+---------+-------+ Brachial 139                                     +---------+------------------+-----+---------+-------+ ATA      152               1.09 triphasic        +---------+------------------+-----+---------+-------+ PTA      189               1.36 triphasic        +---------+------------------+-----+---------+-------+ Great Toe23                0.17 Abnormal         +---------+------------------+-----+---------+-------+ +-------+-----------+-----------+------------+------------+ ABI/TBIToday's ABIToday's TBIPrevious ABIPrevious TBI +-------+-----------+-----------+------------+------------+ Right  1.04       .92        1.18        .68          +-------+-----------+-----------+------------+------------+ Left   1.36       .17        1.18        .43          +-------+-----------+-----------+------------+------------+ Bilateral ABIs appear essentially unchanged compared to prior study on 04/04/2021. Right TBIs appear increased compared to prior study on 04/04/2021. Lt TBIs appear to be decreased compared to prior study on 04/04/2021.  Summary: Right: Resting right ankle-brachial index is within normal range. No evidence of significant right lower extremity arterial disease. The right toe-brachial index is normal. Left: Resting left ankle-brachial index is within normal range. No evidence of significant left lower extremity arterial disease. The left toe-brachial index is abnormal.  *See table(s) above for measurements and observations.  Electronically signed by Leotis Pain MD on 10/05/2021 at 11:33:27 AM.    Final        Assessment & Plan:   1. Peripheral arterial disease with history of revascularization (HCC)  Recommend:  The patient has evidence of atherosclerosis of the lower extremities with claudication.  The  patient does not voice lifestyle limiting changes at this point in time.  The patient does have decreased toe waveforms in the left lower extremity.  However, she does not note any extensive pain or open wounds or ulcerations.  She does have discoloration but the patient notes that this discoloration has been present for years.  Given the patient's advanced age, we will proceed conservatively.  She will return in 3 months for reevaluation. - VAS  [place] ABI WITH/WO TBI  2. Essential hypertension Continue antihypertensive medications as already ordered, these medications have been reviewed and there are no changes at this time.   3. Mixed hyperlipidemia Continue statin as ordered and reviewed, no changes at this time    Current Outpatient Medications on File Prior to Visit  Medication Sig Dispense Refill   acetaminophen (TYLENOL) 500 MG tablet Take 500 mg by mouth every 6 (six) hours as needed for mild pain.     acyclovir (ZOVIRAX) 400 MG tablet Take 400 mg by mouth 2 (two) times daily.     Alpha-Lipoic Acid 600 MG TABS Take 600 mg by mouth daily.     apixaban (ELIQUIS) 5 MG TABS tablet Take 1 tablet (5 mg total) by mouth 2 (two) times daily. 180 tablet 0   atorvastatin (LIPITOR) 20 MG tablet Take 1 tablet (20 mg total) by mouth daily. 90 tablet 3   Docusate Calcium (STOOL SOFTENER PO) Take 1 capsule by mouth daily.     DULoxetine (CYMBALTA) 60 MG capsule TAKE 1 CAPSULE BY MOUTH ONCE DAILY 90 capsule 1   furosemide (LASIX) 20 MG tablet Take 1 tablet (20 mg total) by mouth daily. 90 tablet 3   gabapentin (NEURONTIN) 400 MG capsule TAKE 1 CAPSULE BY MOUTH AT BEDTIME AND SECOND DOSE DURING THE DAY AS NEEDED 120 capsule 3   hydrocortisone (ANUSOL-HC) 2.5 % rectal cream Place 1 application rectally daily as needed for hemorrhoids or anal itching. For hemmorhoid 30 g 0   memantine (NAMENDA) 5 MG tablet Take 1 tablet (5 mg total) by mouth daily AND 2 tablets (10 mg total) at bedtime. 270 tablet 1    metoprolol succinate (TOPROL-XL) 100 MG 24 hr tablet Take 1 tablet (100 mg total) by mouth 2 (two) times daily. 180 tablet 1   pantoprazole (PROTONIX) 40 MG tablet Take 1 tablet (40 mg total) by mouth daily. 90 tablet 3   polyethylene glycol (MIRALAX / GLYCOLAX) 17 g packet Take 17 g by mouth daily as needed for mild constipation. 14 each 0   prednisoLONE acetate (PRED FORTE) 1 % ophthalmic suspension Place 1 drop into the left eye daily.      rOPINIRole (REQUIP) 1 MG tablet Take 2.5 tablets (2.5 mg total) by mouth at bedtime. 225 tablet 3   tiZANidine (ZANAFLEX) 2 MG tablet Take 2 mg by mouth daily.     traMADol (ULTRAM) 50 MG tablet Take 50-100 mg by mouth at bedtime as needed.     Vitamin D, Ergocalciferol, (DRISDOL) 1.25 MG (50000 UNIT) CAPS capsule Take 1 capsule (50,000 Units total) by mouth every 7 (seven) days. 12 capsule 3   No current facility-administered medications on file prior to visit.    There are no Patient Instructions on file for this visit. No follow-ups on file.   Kris Hartmann, NP

## 2021-10-30 ENCOUNTER — Ambulatory Visit
Admission: RE | Admit: 2021-10-30 | Discharge: 2021-10-30 | Disposition: A | Discharge status: Home / Self Care | Payer: Medicare Other | Source: Ambulatory Visit | Attending: Family Medicine | Admitting: Family Medicine

## 2021-10-30 DIAGNOSIS — Z1231 Encounter for screening mammogram for malignant neoplasm of breast: Secondary | ICD-10-CM | POA: Diagnosis not present

## 2021-11-01 ENCOUNTER — Ambulatory Visit: Payer: Medicare Other | Admitting: Dermatology

## 2021-11-01 DIAGNOSIS — Z578 Occupational exposure to other risk factors: Secondary | ICD-10-CM

## 2021-11-01 DIAGNOSIS — Z85828 Personal history of other malignant neoplasm of skin: Secondary | ICD-10-CM | POA: Diagnosis not present

## 2021-11-01 DIAGNOSIS — L570 Actinic keratosis: Secondary | ICD-10-CM | POA: Diagnosis not present

## 2021-11-01 NOTE — Patient Instructions (Signed)
Due to recent changes in healthcare laws, you may see results of your pathology and/or laboratory studies on MyChart before the doctors have had a chance to review them. We understand that in some cases there may be results that are confusing or concerning to you. Please understand that not all results are received at the same time and often the doctors may need to interpret multiple results in order to provide you with the best plan of care or course of treatment. Therefore, we ask that you please give us 2 business days to thoroughly review all your results before contacting the office for clarification. Should we see a critical lab result, you will be contacted sooner.   If You Need Anything After Your Visit  If you have any questions or concerns for your doctor, please call our main line at [phone] and press option 4 to reach your doctor's medical assistant. If no one answers, please leave a voicemail as directed and we will return your call as soon as possible. Messages left after 4 pm will be answered the following business day.   You may also send us a message via MyChart. We typically respond to MyChart messages within 1-2 business days.  For prescription refills, please ask your pharmacy to contact our office. Our fax number is [phone].  If you have an urgent issue when the clinic is closed that cannot wait until the next business day, you can page your doctor at the number below.    Please note that while we do our best to be available for urgent issues outside of office hours, we are not available 24/7.   If you have an urgent issue and are unable to reach us, you may choose to seek medical care at your doctor's office, retail clinic, urgent care center, or emergency room.  If you have a medical emergency, please immediately call 911 or go to the emergency department.  Pager Numbers  - Dr. Kowalski: [phone]  - Dr. Moye: [phone]  - Dr. Stewart:  [phone]  In the event of inclement weather, please call our main line at [phone] for an update on the status of any delays or closures.  Dermatology Medication Tips: Please keep the boxes that topical medications come in in order to help keep track of the instructions about where and how to use these. Pharmacies typically print the medication instructions only on the boxes and not directly on the medication tubes.   If your medication is too expensive, please contact our office at [phone] option 4 or send us a message through MyChart.   We are unable to tell what your co-pay for medications will be in advance as this is different depending on your insurance coverage. However, we may be able to find a substitute medication at lower cost or fill out paperwork to get insurance to cover a needed medication.   If a prior authorization is required to get your medication covered by your insurance company, please allow us 1-2 business days to complete this process.  Drug prices often vary depending on where the prescription is filled and some pharmacies may offer cheaper prices.  The website www.goodrx.com contains coupons for medications through different pharmacies. The prices here do not account for what the cost may be with help from insurance (it may be cheaper with your insurance), but the website can give you the price if you did not use any insurance.  - You can print the associated coupon and take it with   your prescription to the pharmacy.  - You may also stop by our office during regular business hours and pick up a GoodRx coupon card.  - If you need your prescription sent electronically to a different pharmacy, notify our office through [facility] MyChart or by phone at [phone] option 4.     Si Usted Necesita Algo Despus de Su Visita  Tambin puede enviarnos un mensaje a travs de MyChart. Por lo general respondemos a los mensajes de MyChart en el transcurso de 1 a 2  das hbiles.  Para renovar recetas, por favor pida a su farmacia que se ponga en contacto con nuestra oficina. Nuestro nmero de fax es el [phone].  Si tiene un asunto urgente cuando la clnica est cerrada y que no puede esperar hasta el siguiente da hbil, puede llamar/localizar a su doctor(a) al nmero que aparece a continuacin.   Por favor, tenga en cuenta que aunque hacemos todo lo posible para estar disponibles para asuntos urgentes fuera del horario de oficina, no estamos disponibles las 24 horas del da, los 7 das de la semana.   Si tiene un problema urgente y no puede comunicarse con nosotros, puede optar por buscar atencin mdica  en el consultorio de su doctor(a), en una clnica privada, en un centro de atencin urgente o en una sala de emergencias.  Si tiene una emergencia mdica, por favor llame inmediatamente al 911 o vaya a la sala de emergencias.  Nmeros de bper  - Dr. Kowalski: [phone]  - Dra. Moye: [phone]  - Dra. Stewart: [phone]  En caso de inclemencias del tiempo, por favor llame a nuestra lnea principal al [phone] para una actualizacin sobre el estado de cualquier retraso o cierre.  Consejos para la medicacin en dermatologa: Por favor, guarde las cajas en las que vienen los medicamentos de uso tpico para ayudarle a seguir las instrucciones sobre dnde y cmo usarlos. Las farmacias generalmente imprimen las instrucciones del medicamento slo en las cajas y no directamente en los tubos del medicamento.   Si su medicamento es muy caro, por favor, pngase en contacto con nuestra oficina llamando al [phone] y presione la opcin 4 o envenos un mensaje a travs de MyChart.   No podemos decirle cul ser su copago por los medicamentos por adelantado ya que esto es diferente dependiendo de la cobertura de su seguro. Sin embargo, es posible que podamos encontrar un medicamento sustituto a menor costo o llenar un formulario para que el  seguro cubra el medicamento que se considera necesario.   Si se requiere una autorizacin previa para que su compaa de seguros cubra su medicamento, por favor permtanos de 1 a 2 das hbiles para completar este proceso.  Los precios de los medicamentos varan con frecuencia dependiendo del lugar de dnde se surte la receta y alguna farmacias pueden ofrecer precios ms baratos.  El sitio web www.goodrx.com tiene cupones para medicamentos de diferentes farmacias. Los precios aqu no tienen en cuenta lo que podra costar con la ayuda del seguro (puede ser ms barato con su seguro), pero el sitio web puede darle el precio si no utiliz ningn seguro.  - Puede imprimir el cupn correspondiente y llevarlo con su receta a la farmacia.  - Tambin puede pasar por nuestra oficina durante el horario de atencin regular y recoger una tarjeta de cupones de GoodRx.  - Si necesita que su receta se enve electrnicamente a una farmacia diferente, informe a nuestra oficina a travs de MyChart de [facility]   o por telfono llamando al [phone] y presione la opcin 4.  

## 2021-11-01 NOTE — Progress Notes (Signed)
   Follow-Up Visit   Subjective  [name] is a 88 y.o. female who presents for the following: Follow-up (Patient here today for AK follow up. Patient had AK's treated at right hand dorsum at proximal 3 MCP at last visit. She does have a hx of SCC and BCC. ).  Patient accompanied by daughter who contributes to history.   The following portions of the chart were reviewed this encounter and updated as appropriate:   Tobacco  Allergies  Meds  Problems  Med Hx  Surg Hx  Fam Hx      Review of Systems:  No other skin or systemic complaints except as noted in HPI or Assessment and Plan.  Objective  Well appearing patient in no apparent distress; mood and affect are within normal limits.  A focused examination was performed including hands. Relevant physical exam findings are noted in the Assessment and Plan.  Right Hand x 2 Erythematous thin papules/macules with gritty scale.     Assessment & Plan  AK (actinic keratosis) Right Hand x 2  Actinic keratoses are precancerous spots that appear secondary to cumulative UV radiation exposure/sun exposure over time. They are chronic with expected duration over 1 year. A portion of actinic keratoses will progress to squamous cell carcinoma of the skin. It is not possible to reliably predict which spots will progress to skin cancer and so treatment is recommended to prevent development of skin cancer.  Recommend daily broad spectrum sunscreen SPF 30+ to sun-exposed areas, reapply every 2 hours as needed.  Recommend staying in the shade or wearing long sleeves, sun glasses (UVA+UVB protection) and wide brim hats (4-inch brim around the entire circumference of the hat). Call for new or changing lesions.  Pt defers treatment at this time. Will monitor. Patient to RTC if changes.    History of Squamous Cell Carcinoma of the Skin - No evidence of recurrence today - No lymphadenopathy - Recommend regular full body skin exams - Recommend  daily broad spectrum sunscreen SPF 30+ to sun-exposed areas, reapply every 2 hours as needed.  - Call if any new or changing lesions are noted between office visits  Actinic Damage - chronic, secondary to cumulative UV radiation exposure/sun exposure over time - diffuse scaly erythematous macules with underlying dyspigmentation - Recommend daily broad spectrum sunscreen SPF 30+ to sun-exposed areas, reapply every 2 hours as needed.  - Recommend staying in the shade or wearing long sleeves, sun glasses (UVA+UVB protection) and wide brim hats (4-inch brim around the entire circumference of the hat). - Call for new or changing lesions.    Return in about 6 months (around 05/04/2022) for AK follow up.  [name], RMA, am acting as scribe for Crystal Gleason, MD .  Documentation: I have reviewed the above documentation for accuracy and completeness, and I agree with the above.  Crystal Gleason, MD

## 2021-11-05 ENCOUNTER — Encounter: Payer: Self-pay | Admitting: Dermatology

## 2021-11-14 ENCOUNTER — Telehealth: Payer: Self-pay | Admitting: Family Medicine

## 2021-11-14 MED ORDER — TRAMADOL HCL 50 MG PO TABS: 50.0000 mg | ORAL_TABLET | Freq: Every evening | ORAL | 0 refills | Status: DC | PRN

## 2021-11-14 NOTE — Telephone Encounter (Addendum)
What kind of pain is she having? Is it more restless leg symptoms or actual leg pain? What time is she taking the Requip?  Ideal time is 1 to 3 hours before bedtime to give medicine time to act. Okay to take tramadol which I sent to her pharmacy. Could also try tizanidine if it's more muscle spasms.

## 2021-11-14 NOTE — Telephone Encounter (Signed)
Pt's daughter [name]) is wanting a call back concerning her mom's meds.  gabapentin (NEURONTIN) 400 MG capsule [[id number]]  memantine (NAMENDA) 5 MG tablet [[id number]]  rOPINIRole (REQUIP) 1 MG tablet [[id number]]  traMADol (ULTRAM) 50 MG tablet [[id number]].  Her mom has been having pain after dinner and she can not got to sleep until 1-2:00am for the past week. Her day is fine until after dinner.  Patricia's [phone]

## 2021-11-14 NOTE — Telephone Encounter (Signed)
Rtn pt's daughter's, [name] (on dpr).  States pt's RLS is starting about 6-7 PM almost daily disturbing her sleep. Then pt sleeps during the day due to leg pain keeping her up at night.  States she has not been giving pt tramadol b/c pt has not needed it.  Originally, prescribed by back doc.  However, she is asking if pt should be given her tramadol for the leg pain.  If so, pt will need rx sent to [facility].  Plz advise.  Says she will keep watch in pt's MyChart for response.

## 2021-11-14 NOTE — Addendum Note (Signed)
Addended by: Ria Bush on: 11/14/2021 05:54 PM   Modules accepted: Orders

## 2021-11-15 NOTE — Telephone Encounter (Signed)
Spoke with [name] asking Dr. Synthia Innocent questions.  States sxs seem to be RLS related.  Pain is burning sensation and shoots from knee to ankle, then up thigh. Takes Requip 10- 10:30 PM.  I relayed Dr. Synthia Innocent message.  [name] verbalizes understanding and expresses there thanks.

## 2021-11-16 ENCOUNTER — Telehealth: Payer: Self-pay | Admitting: Podiatry

## 2021-11-16 NOTE — Telephone Encounter (Signed)
Patient daughter called and stated that her mom is requesting another round of doxycyline due to the sore on her foot. She also has developed another one. He daughter wanted to get another round of antibiotics since she will be leaving mom to go out of ton next week.  Please advise

## 2021-11-17 MED ORDER — DOXYCYCLINE HYCLATE 100 MG PO TABS: 100.0000 mg | ORAL_TABLET | Freq: Two times a day (BID) | ORAL | 0 refills | Status: DC

## 2021-11-19 NOTE — Telephone Encounter (Signed)
Please advise. Medication has been sent 0812/23

## 2021-11-22 ENCOUNTER — Other Ambulatory Visit: Payer: Self-pay | Admitting: Cardiovascular Disease

## 2021-11-22 NOTE — Telephone Encounter (Signed)
Prescription refill request for Eliquis received. Indication:Afib Last office visit:5/23 Scr:1.2 Age: 88 Weight:64 kg  Prescription refilled

## 2021-11-22 NOTE — Telephone Encounter (Signed)
Refill request

## 2021-12-04 ENCOUNTER — Other Ambulatory Visit: Payer: Medicare Other | Admitting: Student

## 2021-12-19 ENCOUNTER — Ambulatory Visit: Payer: Medicare Other | Admitting: Dermatology

## 2021-12-19 DIAGNOSIS — C44622 Squamous cell carcinoma of skin of right upper limb, including shoulder: Secondary | ICD-10-CM

## 2021-12-19 DIAGNOSIS — D489 Neoplasm of uncertain behavior, unspecified: Secondary | ICD-10-CM

## 2021-12-19 DIAGNOSIS — L570 Actinic keratosis: Secondary | ICD-10-CM | POA: Diagnosis not present

## 2021-12-19 DIAGNOSIS — L578 Other skin changes due to chronic exposure to nonionizing radiation: Secondary | ICD-10-CM | POA: Diagnosis not present

## 2021-12-19 DIAGNOSIS — L821 Other seborrheic keratosis: Secondary | ICD-10-CM | POA: Diagnosis not present

## 2021-12-19 DIAGNOSIS — C4492 Squamous cell carcinoma of skin, unspecified: Secondary | ICD-10-CM

## 2021-12-19 HISTORY — DX: Squamous cell carcinoma of skin, unspecified: C44.92

## 2021-12-19 NOTE — Patient Instructions (Addendum)
For any bleeding, sprinkle on BleedStop powder and hold pressure for 5-10 minutes. Then sprinkle a little more BleedStop powder before bandaging.   [name] is available online and works similarly to [employer].   Biopsy Wound Care Instructions  Leave the original bandage on for 24 hours if possible.  If the bandage becomes soaked or soiled before that time, it is OK to remove it and examine the wound.  A small amount of post-operative bleeding is normal.  If excessive bleeding occurs, remove the bandage, place gauze over the site and apply continuous pressure (no peeking) over the area for 30 minutes. If this does not work, please call our clinic as soon as possible or page your doctor if it is after hours.   Once a day, cleanse the wound with soap and water. It is fine to shower. If a thick crust develops you may use a Q-tip dipped into dilute hydrogen peroxide (mix 1:1 with water) to dissolve it.  Hydrogen peroxide can slow the healing process, so use it only as needed.    After washing, apply petroleum jelly (Vaseline) or an antibiotic ointment if your doctor prescribed one for you, followed by a bandage.    For best healing, the wound should be covered with a layer of ointment at all times. If you are not able to keep the area covered with a bandage to hold the ointment in place, this may mean re-applying the ointment several times a day.  Continue this wound care until the wound has healed and is no longer open.   Itching and mild discomfort is normal during the healing process. However, if you develop pain or severe itching, please call our office.   If you have any discomfort, you can take Tylenol (acetaminophen) or ibuprofen as directed on the bottle. (Please do not take these if you have an allergy to them or cannot take them for another reason).  Some redness, tenderness and white or yellow material in the wound is normal healing.  If the area becomes very sore and red, or develops a  thick yellow-green material (pus), it may be infected; please notify us.    If you have stitches, return to clinic as directed to have the stitches removed. You will continue wound care for 2-3 days after the stitches are removed.   Wound healing continues for up to one year following surgery. It is not unusual to experience pain in the scar from time to time during the interval.  If the pain becomes severe or the scar thickens, you should notify the office.    A slight amount of redness in a scar is expected for the first six months.  After six months, the redness will fade and the scar will soften and fade.  The color difference becomes less noticeable with time.  If there are any problems, return for a post-op surgery check at your earliest convenience.  To improve the appearance of the scar, you can use silicone scar gel, cream, or sheets (such as Mederma or Serica) every night for up to one year. These are available over the counter (without a prescription).  Please call our office at [phone] for any questions or concerns.    Electrodesiccation and Curettage ("Scrape and Burn") Wound Care Instructions  Leave the original bandage on for 24 hours if possible.  If the bandage becomes soaked or soiled before that time, it is OK to remove it and examine the wound.  A small amount of post-operative  bleeding is normal.  If excessive bleeding occurs, remove the bandage, place gauze over the site and apply continuous pressure (no peeking) over the area for 30 minutes. If this does not work, please call our clinic as soon as possible or page your doctor if it is after hours.   Once a day, cleanse the wound with soap and water. It is fine to shower. If a thick crust develops you may use a Q-tip dipped into dilute hydrogen peroxide (mix 1:1 with water) to dissolve it.  Hydrogen peroxide can slow the healing process, so use it only as needed.    After washing, apply petroleum jelly (Vaseline) or an  antibiotic ointment if your doctor prescribed one for you, followed by a bandage.    For best healing, the wound should be covered with a layer of ointment at all times. If you are not able to keep the area covered with a bandage to hold the ointment in place, this may mean re-applying the ointment several times a day.  Continue this wound care until the wound has healed and is no longer open. It may take several weeks for the wound to heal and close.  Itching and mild discomfort is normal during the healing process.  If you have any discomfort, you can take Tylenol (acetaminophen) or ibuprofen as directed on the bottle. (Please do not take these if you have an allergy to them or cannot take them for another reason).  Some redness, tenderness and white or yellow material in the wound is normal healing.  If the area becomes very sore and red, or develops a thick yellow-green material (pus), it may be infected; please notify us.    Wound healing continues for up to one year following surgery. It is not unusual to experience pain in the scar from time to time during the interval.  If the pain becomes severe or the scar thickens, you should notify the office.    A slight amount of redness in a scar is expected for the first six months.  After six months, the redness will fade and the scar will soften and fade.  The color difference becomes less noticeable with time.  If there are any problems, return for a post-op surgery check at your earliest convenience.  To improve the appearance of the scar, you can use silicone scar gel, cream, or sheets (such as Mederma or Serica) every night for up to one year. These are available over the counter (without a prescription).  Please call our office at [phone] for any questions or concerns.     Cryotherapy Aftercare  Wash gently with soap and water everyday.   Apply Vaseline and Band-Aid daily until healed.   Actinic keratoses are precancerous spots  that appear secondary to cumulative UV radiation exposure/sun exposure over time. They are chronic with expected duration over 1 year. A portion of actinic keratoses will progress to squamous cell carcinoma of the skin. It is not possible to reliably predict which spots will progress to skin cancer and so treatment is recommended to prevent development of skin cancer.  Recommend daily broad spectrum sunscreen SPF 30+ to sun-exposed areas, reapply every 2 hours as needed.  Recommend staying in the shade or wearing long sleeves, sun glasses (UVA+UVB protection) and wide brim hats (4-inch brim around the entire circumference of the hat). Call for new or changing lesions.   Seborrheic Keratosis  What causes seborrheic keratoses? Seborrheic keratoses are harmless, common skin growths that first  appear during adult life.  As time goes by, more growths appear.  Some people may develop a large number of them.  Seborrheic keratoses appear on both covered and uncovered body parts.  They are not caused by sunlight.  The tendency to develop seborrheic keratoses can be inherited.  They vary in color from skin-colored to gray, brown, or even black.  They can be either smooth or have a rough, warty surface.   Seborrheic keratoses are superficial and look as if they were stuck on the skin.  Under the microscope this type of keratosis looks like layers upon layers of skin.  That is why at times the top layer may seem to fall off, but the rest of the growth remains and re-grows.    Treatment Seborrheic keratoses do not need to be treated, but can easily be removed in the office.  Seborrheic keratoses often cause symptoms when they rub on clothing or jewelry.  Lesions can be in the way of shaving.  If they become inflamed, they can cause itching, soreness, or burning.  Removal of a seborrheic keratosis can be accomplished by freezing, burning, or surgery. If any spot bleeds, scabs, or grows rapidly, please return to have  it checked, as these can be an indication of a skin cancer.   Due to recent changes in healthcare laws, you may see results of your pathology and/or laboratory studies on MyChart before the doctors have had a chance to review them. We understand that in some cases there may be results that are confusing or concerning to you. Please understand that not all results are received at the same time and often the doctors may need to interpret multiple results in order to provide you with the best plan of care or course of treatment. Therefore, we ask that you please give [place] 2 business days to thoroughly review all your results before contacting the office for clarification. Should we see a critical lab result, you will be contacted sooner.   If You Need Anything After Your Visit  If you have any questions or concerns for your doctor, please call our main line at [phone] and press option 4 to reach your doctor's medical assistant. If no one answers, please leave a voicemail as directed and we will return your call as soon as possible. Messages left after 4 pm will be answered the following business day.   You may also send [place] a message via [place]. We typically respond to MyChart messages within 1-2 business days.  For prescription refills, please ask your pharmacy to contact our office. Our fax number is [phone].  If you have an urgent issue when the clinic is closed that cannot wait until the next business day, you can page your doctor at the number below.    Please note that while we do our best to be available for urgent issues outside of office hours, we are not available 24/7.   If you have an urgent issue and are unable to reach [place], you may choose to seek medical care at your doctor's office, retail clinic, urgent care center, or emergency room.  If you have a medical emergency, please immediately call 911 or go to the emergency department.  Pager Numbers  - Dr. Nehemiah Massed:  [phone]  - Dr. Laurence Ferrari: [phone]  - Dr. Nicole Kindred: [phone]  In the event of inclement weather, please call our main line at [phone] for an update on the status of any delays or closures.  Dermatology  Medication Tips: Please keep the boxes that topical medications come in in order to help keep track of the instructions about where and how to use these. Pharmacies typically print the medication instructions only on the boxes and not directly on the medication tubes.   If your medication is too expensive, please contact our office at [phone] option 4 or send [place] a message through [place].   We are unable to tell what your co-pay for medications will be in advance as this is different depending on your insurance coverage. However, we may be able to find a substitute medication at lower cost or fill out paperwork to get insurance to cover a needed medication.   If a prior authorization is required to get your medication covered by your insurance company, please allow [place] 1-2 business days to complete this process.  Drug prices often vary depending on where the prescription is filled and some pharmacies may offer cheaper prices.  The website www.goodrx.com contains coupons for medications through different pharmacies. The prices here do not account for what the cost may be with help from insurance (it may be cheaper with your insurance), but the website can give you the price if you did not use any insurance.  - You can print the associated coupon and take it with your prescription to the pharmacy.  - You may also stop by our office during regular business hours and pick up a GoodRx coupon card.  - If you need your prescription sent electronically to a different pharmacy, notify our office through [facility] or by phone at [phone] option 4.     Si Usted Necesita Algo Despus de Su Visita  Tambin puede enviarnos un mensaje a travs de Pharmacist, community. Por lo general  respondemos a los mensajes de MyChart en el transcurso de 1 a 2 das hbiles.  Para renovar recetas, por favor pida a su farmacia que se ponga en contacto con nuestra oficina. Harland Dingwall de fax es [place] [phone].  Si tiene un asunto urgente cuando la clnica est cerrada y que no puede esperar hasta el siguiente da hbil, puede llamar/localizar a su doctor(a) al nmero que aparece a continuacin.   Por favor, tenga en cuenta que aunque hacemos todo lo posible para estar disponibles para asuntos urgentes fuera del horario de [place], no estamos disponibles las 24 horas del da, los 7 das de la [place].   Si tiene un problema urgente y no puede comunicarse con nosotros, puede optar por buscar atencin mdica  en el consultorio de su doctor(a), en una clnica privada, en un centro de atencin urgente o en una sala de emergencias.  Si tiene Engineering geologist, por favor llame inmediatamente al 911 o vaya a la sala de emergencias.  Nmeros de bper  - Dr. Nehemiah Massed: [phone]  - Dra. Moye: [phone]  - Dra. Nicole Kindred: [phone]  En caso de inclemencias del [place], por favor llame a Johnsie Kindred principal al [phone] para una actualizacin sobre el [place] de cualquier retraso o cierre.  Consejos para la medicacin en dermatologa: Por favor, guarde las cajas en las que vienen los medicamentos de uso tpico para ayudarle a seguir las instrucciones sobre dnde y cmo usarlos. Las farmacias generalmente imprimen las instrucciones del medicamento slo en las cajas y no directamente en los tubos del [place].   Si su medicamento es muy caro, por favor, pngase en contacto con Zigmund Daniel llamando al [phone] y presione la opcin 4 o envenos un mensaje a  travs de MyChart.   No podemos decirle cul ser su copago por los medicamentos por adelantado ya que esto es diferente dependiendo de la cobertura de su seguro. Sin embargo, es posible que podamos encontrar un  medicamento sustituto a Electrical engineer un formulario para que el seguro cubra el medicamento que se considera necesario.   Si se requiere una autorizacin previa para que su compaa de seguros [place] su medicamento, por favor permtanos de 1 a 2 das hbiles para completar este proceso.  Los precios de los medicamentos varan con frecuencia dependiendo del Environmental consultant de dnde se surte la receta y alguna farmacias pueden ofrecer precios ms baratos.  El sitio web www.goodrx.com tiene cupones para medicamentos de Airline pilot. Los precios aqu no tienen en cuenta lo que podra costar con la ayuda del seguro (puede ser ms barato con su seguro), pero el sitio web puede darle el precio si no utiliz Research scientist (physical sciences).  - Puede imprimir el cupn correspondiente y llevarlo con su receta a la farmacia.  - Tambin puede pasar por nuestra oficina durante el horario de atencin regular y Charity fundraiser una tarjeta de cupones de GoodRx.  - Si necesita que su receta se enve electrnicamente a una farmacia diferente, informe a nuestra oficina a travs de MyChart de [place] o por telfono llamando al [phone] y presione la opcin 4.

## 2021-12-19 NOTE — Progress Notes (Signed)
Follow-Up Visit   Subjective  [name] is a 88 y.o. female who presents for the following: Follow-up (Patient here with daughter today concerning some places on right hand and face she would like checked.).  The patient has spots, moles and lesions to be evaluated, some may be new or changing and the patient has concerns that these could be cancer.  The following portions of the chart were reviewed this encounter and updated as appropriate:  Tobacco  Allergies  Meds  Problems  Med Hx  Surg Hx  Fam Hx      Review of Systems: No other skin or systemic complaints except as noted in HPI or Assessment and Plan.   Objective  Well appearing patient in no apparent distress; mood and affect are within normal limits.  A focused examination was performed including right temple, left cheek, right dorsal hand, . Relevant physical exam findings are noted in the Assessment and Plan.  right temple x 1, left cheek x 1 , right dorsal hand x 2 (4) Erythematous thin papules/macules with gritty scale.   proximal right dorsal hand 1.0 cm scaly pink nodule        distal right dorsal hand  1.2 scaly pink nodule         Assessment & Plan  Actinic keratosis (4) right temple x 1, left cheek x 1 , right dorsal hand x 2  Actinic keratoses are precancerous spots that appear secondary to cumulative UV radiation exposure/sun exposure over time. They are chronic with expected duration over 1 year. A portion of actinic keratoses will progress to squamous cell carcinoma of the skin. It is not possible to reliably predict which spots will progress to skin cancer and so treatment is recommended to prevent development of skin cancer.  Recommend daily broad spectrum sunscreen SPF 30+ to sun-exposed areas, reapply every 2 hours as needed.  Recommend staying in the shade or wearing long sleeves, sun glasses (UVA+UVB protection) and wide brim hats (4-inch brim around the entire circumference of  the hat). Call for new or changing lesions.  Destruction of lesion - right temple x 1, left cheek x 1 , right dorsal hand x 2  Destruction method: cryotherapy   Informed consent: discussed and consent obtained   Lesion destroyed using liquid nitrogen: Yes   Cryotherapy cycles:  2 Outcome: patient tolerated procedure well with no complications   Post-procedure details: wound care instructions given   Additional details:  Prior to procedure, discussed risks of blister formation, small wound, skin dyspigmentation, or rare scar following cryotherapy. Recommend Vaseline ointment to treated areas while healing.   Neoplasm of uncertain behavior (2) proximal right dorsal hand  Epidermal / dermal shaving  Lesion diameter (cm):  1 Informed consent: discussed and consent obtained   Timeout: patient name, date of birth, surgical site, and procedure verified   Anesthesia: the lesion was anesthetized in a standard fashion   Anesthetic:  1% lidocaine w/ epinephrine 1-100,000 local infiltration Instrument used: flexible razor blade   Hemostasis achieved with: aluminum chloride   Outcome: patient tolerated procedure well   Post-procedure details: wound care instructions given   Additional details:  Mupirocin and a bandage applied  Destruction of lesion  Destruction method: electrodesiccation and curettage   Informed consent: discussed and consent obtained   Timeout:  patient name, date of birth, surgical site, and procedure verified Patient was prepped and draped in usual sterile fashion: area prepped with isopropyl alcohol. Anesthesia: the lesion was anesthetized  in a standard fashion   Anesthetic:  1% lidocaine w/ epinephrine 1-100,000 buffered w/ 8.4% NaHCO3 Curettage performed in three different directions: Yes   Electrodesiccation performed over the curetted area: Yes   Curettage cycles:  3 Final wound size (cm):  1 Hemostasis achieved with:  electrodesiccation Outcome: patient  tolerated procedure well with no complications   Post-procedure details: wound care instructions given   Additional details:  Mupirocin and a pressure dressing applied  Specimen 1 - Surgical pathology Differential Diagnosis: r/o scc  Check Margins: No  distal right dorsal hand  Epidermal / dermal shaving  Lesion diameter (cm):  1.2 Informed consent: discussed and consent obtained   Timeout: patient name, date of birth, surgical site, and procedure verified   Patient was prepped and draped in usual sterile fashion: area prepped with isopropyl alcohol. Anesthesia: the lesion was anesthetized in a standard fashion   Anesthetic:  1% lidocaine w/ epinephrine 1-100,000 buffered w/ 8.4% NaHCO3 Instrument used: flexible razor blade   Hemostasis achieved with: aluminum chloride   Outcome: patient tolerated procedure well   Post-procedure details: wound care instructions given   Additional details:  Mupirocin and a bandage applied  Destruction of lesion  Destruction method: electrodesiccation and curettage   Informed consent: discussed and consent obtained   Timeout:  patient name, date of birth, surgical site, and procedure verified Patient was prepped and draped in usual sterile fashion: area prepped with isopropyl alcohol. Anesthesia: the lesion was anesthetized in a standard fashion   Anesthetic:  1% lidocaine w/ epinephrine 1-100,000 buffered w/ 8.4% NaHCO3 Curettage performed in three different directions: Yes   Electrodesiccation performed over the curetted area: Yes   Curettage cycles:  3 Final wound size (cm):  1.2 Hemostasis achieved with:  electrodesiccation Outcome: patient tolerated procedure well with no complications   Post-procedure details: wound care instructions given   Additional details:  Mupirocin and a pressure dressing applied  Specimen 2 - Surgical pathology Differential Diagnosis: r/o scc  Check Margins: No  R/o scc   ED&C     Seborrheic  Keratoses - Stuck-on, waxy, tan-brown papules and/or plaques  - Benign-appearing - Discussed benign etiology and prognosis. - Observe - Call for any changes  Actinic Damage - chronic, secondary to cumulative UV radiation exposure/sun exposure over time - diffuse scaly erythematous macules with underlying dyspigmentation - Recommend daily broad spectrum sunscreen SPF 30+ to sun-exposed areas, reapply every 2 hours as needed.  - Recommend staying in the shade or wearing long sleeves, sun glasses (UVA+UVB protection) and wide brim hats (4-inch brim around the entire circumference of the hat). - Call for new or changing lesions.  Return for 3 - 4 month recheck ed&c and aks.  I, Ruthell Rummage, CMA, am acting as scribe for Forest Gleason, MD.  Documentation: I have reviewed the above documentation for accuracy and completeness, and I agree with the above.  Forest Gleason, MD

## 2021-12-24 ENCOUNTER — Telehealth: Payer: Self-pay

## 2021-12-24 NOTE — Telephone Encounter (Signed)
Discussed pathology results with patient's daughter, [name]. [place] for recheck. JP

## 2021-12-24 NOTE — Telephone Encounter (Signed)
-----   Message from Alfonso Patten, MD sent at 12/21/2021  9:39 AM EDT ----- 1. Skin , proximal right dorsal hand SQUAMOUS CELL CARCINOMA, KERATOACANTHOMA TYPE 2. Skin , distal right dorsal hand SQUAMOUS CELL CARCINOMA, KERATOACANTHOMA TYPE  Both already treated with ED&C. Will recheck in clinic at her scheduled follow-up.  MAs please call. Thank you!

## 2021-12-27 ENCOUNTER — Encounter: Payer: Self-pay | Admitting: Dermatology

## 2021-12-31 ENCOUNTER — Other Ambulatory Visit: Payer: Medicare Other | Admitting: Student

## 2021-12-31 DIAGNOSIS — Z515 Encounter for palliative care: Secondary | ICD-10-CM

## 2021-12-31 DIAGNOSIS — L97519 Non-pressure chronic ulcer of other part of right foot with unspecified severity: Secondary | ICD-10-CM | POA: Diagnosis not present

## 2021-12-31 DIAGNOSIS — G2581 Restless legs syndrome: Secondary | ICD-10-CM

## 2021-12-31 DIAGNOSIS — M79606 Pain in leg, unspecified: Secondary | ICD-10-CM | POA: Diagnosis not present

## 2021-12-31 DIAGNOSIS — I5032 Chronic diastolic (congestive) heart failure: Secondary | ICD-10-CM | POA: Diagnosis not present

## 2021-12-31 NOTE — Progress Notes (Signed)
[facility] Consult Note Telephone: [phone]  Fax: [phone]    Date of encounter: 12/31/21 1:48 PM PATIENT NAME: [name] [address] [place] [address]   [phone] (home)  DOB: [birth year] MRN: [id number] PRIMARY CARE PROVIDER:    Ria Bush, MD,  [place] [address] [phone]  REFERRING PROVIDER:   Ria Bush, [place],  [place] [address] [phone]  RESPONSIBLE PARTY:    Contact Information     Name Relation Home Work [place] Daughter [phone]     [name] Daughter [phone]  [phone]   [name] Daughter [phone]  [phone]        I met face to face with patient and caregiver in the home. Palliative Care was asked to follow this patient by consultation request of  Ria Bush, MD to address advance care planning and complex medical decision making. This is a follow up visit.                                   ASSESSMENT AND PLAN / RECOMMENDATIONS:   Advance Care Planning/Goals of Care: Goals include to maximize quality of life and symptom management. Patient/health care surrogate gave his/her permission to discuss. Our advance care planning conversation included a discussion about:    The value and importance of advance care planning  Experiences with loved ones who have been seriously ill or have died  Exploration of personal, cultural or spiritual beliefs that might influence medical decisions  Exploration of goals of care in the event of a sudden injury or illness  CODE STATUS: DNR  Education provided on Palliative Medicine.   Symptom Management/Plan:  Right lateral foot- patient with recurrent wounds to area. Area is currently with pinpoint scab. No erythema or drainage. She does express mild tenderness to area. Family has been applying betadine to area. Monitor for  worsening, signs of infection. Follow up with podiatry as needed.   Left 5th toe-pinpoint black area present, first noticed in the past couple of days. Recommend applying skin prep to area twice daily. Recommend keeping shoes off when sitting, no pressure on area.   Heart failure-shortness of breath with exertion, no LE edema. Continue furosemide, metoprolol as directed. Continue daily weights.   Restless leg syndrome, leg pain- continue Requip, gabapentin twice a day.  Follow up Palliative Care Visit: Palliative care will continue to follow for complex medical decision making, advance care planning, and clarification of goals. Return as needed.  This visit was coded based on medical decision making (MDM).  PPS: 40%  HOSPICE ELIGIBILITY/DIAGNOSIS: TBD  Chief Complaint: Palliative Medicine follow up visit.   HISTORY OF PRESENT ILLNESS:  [name] is a 88 y.o. year old female  with PVD, chronic ulcer to right foot, osteomyelitis, chronic diastolic heart failure, mild dementia, polyneuropathy, RLS, hypertension, atrial fibrillation, hyperlipidemia, CKD stage 3.   Patient has been doing well. Denies pain, chest pain.no worsening fatigue. Gabapentin added in the afternoon added for worsening pain/restlessness. Reports some improvement with increased gabapentin. Appeitte has been good. Area to right lateral foot-applying betadine. Has a new black pinpoint area to left; first noticed in the last couple of days. She had two actinic keratosis areas removed to right hand last week. Vaseline being applied to areas, covered with dressing. No recent falls or injury; using walker  for ambulation.  Patient received resting in recliner. Pleasant affect. Bilateral feet blue/purple in color; blanches to touch. She has pinpoint black area to 5th left lateral toe. Scab to right lateral foot; no erythema, swelling. She does express mild tenderness with palpation. Two areas to right dorsal surface of hand. She  has regular caregivers, supportive family.   History obtained from review of EMR, discussion with primary team, and interview with family, facility staff/caregiver and/or [name].  I reviewed available labs, medications, imaging, studies and related documents from the EMR.  Records reviewed and summarized above.   ROS  A 10-point ROS is negative, except for the pertinent positives and negatives detailed per the HPI.  Physical Exam: Pulse 76, resp 16, bp 158/76 Constitutional: NAD General: frail appearing EYES: anicteric sclera, lids intact, no discharge  ENMT: intact hearing, oral mucous membranes moist, dentition intact CV: S1S2, irregular rhythm, no LE edema Pulmonary: LCTA, no increased work of breathing, no cough, room air Abdomen: normo-active BS + 4 quadrants, soft and non tender GU: deferred MSK: moves all extremities, ambulatory Skin: warm and dry Neuro: + generalized weakness, A & O x 3, forgetful Psych: non-anxious affect Hem/lymph/immuno: no widespread bruising   Thank you for the opportunity to participate in the care of Ms. Levit.  The palliative care team will continue to follow. Please call our office at [phone] if we can be of additional assistance.   Ezekiel Slocumb, NP   COVID-19 PATIENT SCREENING TOOL Asked and negative response unless otherwise noted:   Have you had symptoms of covid, tested positive or been in contact with someone with symptoms/positive test in the past 5-10 days? No

## 2022-01-01 ENCOUNTER — Telehealth: Payer: Self-pay | Admitting: Student

## 2022-01-02 NOTE — Telephone Encounter (Signed)
NP discussed palliative visit. Patient has been stable. Daughter [name] is made aware of upcoming program changes.

## 2022-01-08 ENCOUNTER — Ambulatory Visit: Payer: Medicare Other | Admitting: Cardiovascular Disease

## 2022-01-11 ENCOUNTER — Other Ambulatory Visit: Payer: Self-pay | Admitting: Cardiovascular Disease

## 2022-01-14 ENCOUNTER — Ambulatory Visit: Payer: Medicare Other | Admitting: Family Medicine

## 2022-01-15 ENCOUNTER — Ambulatory Visit: Payer: Medicare Other | Admitting: Cardiovascular Disease

## 2022-01-22 ENCOUNTER — Encounter (INDEPENDENT_AMBULATORY_CARE_PROVIDER_SITE_OTHER): Payer: Medicare Other

## 2022-01-22 ENCOUNTER — Ambulatory Visit (INDEPENDENT_AMBULATORY_CARE_PROVIDER_SITE_OTHER): Payer: Medicare Other | Admitting: Vascular Surgery

## 2022-01-26 ENCOUNTER — Other Ambulatory Visit: Payer: Self-pay | Admitting: Cardiovascular Disease

## 2022-01-26 ENCOUNTER — Other Ambulatory Visit: Payer: Self-pay | Admitting: Family Medicine

## 2022-01-26 DIAGNOSIS — F331 Major depressive disorder, recurrent, moderate: Secondary | ICD-10-CM

## 2022-01-26 DIAGNOSIS — K219 Gastro-esophageal reflux disease without esophagitis: Secondary | ICD-10-CM

## 2022-01-28 NOTE — Telephone Encounter (Signed)
Prescription refill request for Eliquis received. Indication:Afib Last office visit:5/23 Scr:1.2 Age: 88 Weight:64 kg  Prescription refilled

## 2022-01-28 NOTE — Telephone Encounter (Signed)
E-scribed duloxetine refill.  Too early for pantoprazole refill.  Last rx sent 09/13/21, #90/3.  Pantoprazole request denied.

## 2022-01-29 ENCOUNTER — Ambulatory Visit: Payer: Medicare Other | Admitting: Family Medicine

## 2022-01-30 ENCOUNTER — Ambulatory Visit (INDEPENDENT_AMBULATORY_CARE_PROVIDER_SITE_OTHER): Payer: Medicare Other | Admitting: Family Medicine

## 2022-01-30 ENCOUNTER — Other Ambulatory Visit: Payer: Self-pay | Admitting: Cardiovascular Disease

## 2022-01-30 ENCOUNTER — Encounter: Payer: Self-pay | Admitting: Family Medicine

## 2022-01-30 VITALS — BP 150/82 | HR 81 | Temp 97.7°F | Ht 62.0 in | Wt 144.5 lb

## 2022-01-30 DIAGNOSIS — G3184 Mild cognitive impairment, so stated: Secondary | ICD-10-CM | POA: Diagnosis not present

## 2022-01-30 DIAGNOSIS — Z23 Encounter for immunization: Secondary | ICD-10-CM

## 2022-01-30 DIAGNOSIS — G6289 Other specified polyneuropathies: Secondary | ICD-10-CM

## 2022-01-30 DIAGNOSIS — I10 Essential (primary) hypertension: Secondary | ICD-10-CM | POA: Diagnosis not present

## 2022-01-30 DIAGNOSIS — R630 Anorexia: Secondary | ICD-10-CM

## 2022-01-30 DIAGNOSIS — Z515 Encounter for palliative care: Secondary | ICD-10-CM

## 2022-01-30 DIAGNOSIS — Z66 Do not resuscitate: Secondary | ICD-10-CM | POA: Diagnosis not present

## 2022-01-30 DIAGNOSIS — G2581 Restless legs syndrome: Secondary | ICD-10-CM

## 2022-01-30 MED ORDER — MEMANTINE HCL 10 MG PO TABS: 10.0000 mg | ORAL_TABLET | Freq: Two times a day (BID) | ORAL | 3 refills | Status: DC

## 2022-01-30 NOTE — Assessment & Plan Note (Addendum)
Appreciate palliative care assistance.  Daughter informs me outpatient palliative care has ended due to restructuring of [facility].

## 2022-01-30 NOTE — Progress Notes (Unsigned)
Patient ID: [name], female    DOB: June 26, 1929, 88 y.o.   MRN: [id number]  This visit was conducted in person.  BP (!) 150/82   Pulse 81   Temp 97.7 F (36.5 C) (Temporal)   Ht '5\' 2"'$  (1.575 m)   Wt 144 lb 8 oz (65.5 kg)   SpO2 95%   BMI 26.43 kg/m   BP Readings from Last 3 Encounters:  01/30/22 (!) 150/82  10/03/21 (!) 145/86  09/12/21 140/84  Brings BP log from home - averaging 130-140 over the past 3 wks   CC: geriatric assessment Subjective:   HPI: [name] is a 88 y.o. female presenting on 01/30/2022 for Memory Loss (Here for 4 mo memory f/u. Pt accompanied by daughter, [name]. )   Upcoming trip to the beach this weekend.  She enjoys playing bingo once a week.   Memory difficulty - continues namenda '5mg'$  BID (started 12/2019). She notes difficulty remembering names.   Geriatric Assessment: Activities of Daily Living:     Bathing- dependent    Dressing- independent    Eating- independent    Toileting- independent    Transferring- independent    Continence- independent Overall Assessment: independent  Instrumental Activities of Daily Living:     Transportation- dependent     Meal/Food Preparation- dependent     Shopping Errands- dependent    Housekeeping/Chores- dependent    Money Management/Finances- dependent    Medication Management- dependent    Ability to Use Telephone- independent    Laundry- dependent Overall Assessment: dependent   Mental Status Exam: 27/30 (value/max value)     Clock Drawing Score: 3/4  Notes ongoing fatigue, easy exertion.   Has been followed by [facility] palliative care at home - last visit 12/2021, note reviewed. Daughter informs me outpatient palliative care services are ending.   On PRN tramadol for chronic leg pain. On requip for RLS - should take 1-3 hours before bedtime. Gabapentin '400mg'$  BID is also helping.   Has seen PM&R for know acute on chronic lower back pain with radiation to bilateral buttocks  and groin. Thought due to spinal stenosis, disc herniations, lumbar radiculitis. Last seen 09/2021, received bilateral gluteal trigger point injections.   Continues eliquis for afib.  She notes pills can be difficult to swallow.   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   She's completed shingrix vaccine     Relevant past medical, surgical, family and social history reviewed and updated as indicated. Interim medical history since our last visit reviewed. Allergies and medications reviewed and updated. Outpatient Medications Prior to Visit  Medication Sig Dispense Refill   acetaminophen (TYLENOL) 500 MG tablet Take 500 mg by mouth every 6 (six) hours as needed for mild pain.     acyclovir (ZOVIRAX) 400 MG tablet Take 400 mg by mouth 2 (two) times daily.     Alpha-Lipoic Acid 600 MG TABS Take 600 mg by mouth daily.     amLODipine (NORVASC) 2.5 MG tablet Take 1 tablet (2.5 mg total) by mouth daily. 90 tablet 1   atorvastatin (LIPITOR) 20 MG tablet Take 1 tablet (20 mg total) by mouth daily. 90 tablet 3   Docusate Calcium (STOOL SOFTENER PO) Take 1 capsule by mouth daily.     DULoxetine (CYMBALTA) 60 MG capsule TAKE 1 CAPSULE BY MOUTH ONCE DAILY 90 capsule 2   ELIQUIS 5 MG TABS tablet TAKE 1 TABLET BY MOUTH TWICE DAILY 180 tablet  1   furosemide (LASIX) 20 MG tablet Take 1 tablet (20 mg total) by mouth daily. 90 tablet 0   gabapentin (NEURONTIN) 400 MG capsule TAKE 1 CAPSULE BY MOUTH AT BEDTIME AND SECOND DOSE DURING THE DAY AS NEEDED 120 capsule 3   hydrocortisone (ANUSOL-HC) 2.5 % rectal cream Place 1 application rectally daily as needed for hemorrhoids or anal itching. For hemmorhoid 30 g 0   lisinopril (ZESTRIL) 20 MG tablet TAKE ONE TABLET BY MOUTH ONCE DAILY 90 tablet 0   metoprolol succinate (TOPROL-XL) 100 MG 24 hr tablet Take 1 tablet (100 mg total) by mouth 2 (two) times daily. 180 tablet 1   pantoprazole (PROTONIX) 40 MG tablet Take 1 tablet (40 mg  total) by mouth daily. 90 tablet 3   polyethylene glycol (MIRALAX / GLYCOLAX) 17 g packet Take 17 g by mouth daily as needed for mild constipation. 14 each 0   prednisoLONE acetate (PRED FORTE) 1 % ophthalmic suspension Place 1 drop into the left eye daily.      rOPINIRole (REQUIP) 1 MG tablet Take 2.5 tablets (2.5 mg total) by mouth at bedtime. 225 tablet 3   tiZANidine (ZANAFLEX) 2 MG tablet Take 2 mg by mouth daily.     traMADol (ULTRAM) 50 MG tablet Take 1-2 tablets (50-100 mg total) by mouth at bedtime as needed. 30 tablet 0   Vitamin D, Ergocalciferol, (DRISDOL) 1.25 MG (50000 UNIT) CAPS capsule Take 1 capsule (50,000 Units total) by mouth every 7 (seven) days. 12 capsule 3   memantine (NAMENDA) 5 MG tablet Take 1 tablet (5 mg total) by mouth daily AND 2 tablets (10 mg total) at bedtime. 270 tablet 1   doxycycline (VIBRA-TABS) 100 MG tablet Take 1 tablet (100 mg total) by mouth 2 (two) times daily. 28 tablet 0   No facility-administered medications prior to visit.     Per HPI unless specifically indicated in ROS section below Review of Systems  Objective:  BP (!) 150/82   Pulse 81   Temp 97.7 F (36.5 C) (Temporal)   Ht '5\' 2"'$  (1.575 m)   Wt 144 lb 8 oz (65.5 kg)   SpO2 95%   BMI 26.43 kg/m   Wt Readings from Last 3 Encounters:  01/30/22 144 lb 8 oz (65.5 kg)  09/12/21 141 lb 2 oz (64 kg)  09/04/21 140 lb (63.5 kg)      Physical Exam Vitals and nursing note reviewed.  Constitutional:      Appearance: Normal appearance. She is not ill-appearing.  Cardiovascular:     Rate and Rhythm: Normal rate and regular rhythm.     Pulses: Normal pulses.     Heart sounds: Normal heart sounds. No murmur heard. Pulmonary:     Effort: Pulmonary effort is normal. No respiratory distress.     Breath sounds: Normal breath sounds. No wheezing, rhonchi or rales.  Musculoskeletal:     Right lower leg: No edema.     Left lower leg: No edema.     Comments: Chronically discolored lower  extremities, R>L - purplish hue  Skin:    General: Skin is cool and dry.     Findings: No rash.  Neurological:     Mental Status: She is alert.  Psychiatric:        Mood and Affect: Mood normal.        Behavior: Behavior normal.       Results for orders placed or performed in visit on 09/12/21  Vitamin B12  Result Value Ref Range   Vitamin B-12 >1504 (H) 211 - 911 pg/mL  Ferritin  Result Value Ref Range   Ferritin 47.0 10.0 - 291.0 ng/mL  IBC panel  Result Value Ref Range   Iron 102 42 - 145 ug/dL   Transferrin 217.0 212.0 - 360.0 mg/dL   Saturation Ratios 33.6 20.0 - 50.0 %   TIBC 303.8 250.0 - 450.0 mcg/dL  VITAMIN D 25 Hydroxy (Vit-D Deficiency, Fractures)  Result Value Ref Range   VITD 93.91 30.00 - 100.00 ng/mL  Lipid panel  Result Value Ref Range   Cholesterol 148 0 - 200 mg/dL   Triglycerides 124.0 0.0 - 149.0 mg/dL   HDL 52.40 >39.00 mg/dL   VLDL 24.8 0.0 - 40.0 mg/dL   LDL Cholesterol 70 0 - 99 mg/dL   Total CHOL/HDL Ratio 3    NonHDL 95.11   TSH  Result Value Ref Range   TSH 2.34 0.35 - 5.50 uIU/mL  Comprehensive metabolic panel  Result Value Ref Range   Sodium 139 135 - 145 mEq/L   Potassium 3.8 3.5 - 5.1 mEq/L   Chloride 98 96 - 112 mEq/L   CO2 32 19 - 32 mEq/L   Glucose, Bld 90 70 - 99 mg/dL   BUN 20 6 - 23 mg/dL   Creatinine, Ser 1.24 (H) 0.40 - 1.20 mg/dL   Total Bilirubin 0.7 0.2 - 1.2 mg/dL   Alkaline Phosphatase 70 39 - 117 U/L   AST 15 0 - 37 U/L   ALT 10 0 - 35 U/L   Total Protein 6.9 6.0 - 8.3 g/dL   Albumin 4.0 3.5 - 5.2 g/dL   GFR 38.38 (L) >60.00 mL/min   Calcium 9.7 8.4 - 10.5 mg/dL  CBC with Differential/Platelet  Result Value Ref Range   WBC 7.3 4.0 - 10.5 K/uL   RBC 4.63 3.87 - 5.11 Mil/uL   Hemoglobin 15.0 12.0 - 15.0 g/dL   HCT 44.4 36.0 - 46.0 %   MCV 95.9 78.0 - 100.0 fl   MCHC 33.7 30.0 - 36.0 g/dL   RDW 14.0 11.5 - 15.5 %   Platelets 228.0 150.0 - 400.0 K/uL   Neutrophils Relative % 58.4 43.0 - 77.0 %   Lymphocytes  Relative 29.7 12.0 - 46.0 %   Monocytes Relative 9.1 3.0 - 12.0 %   Eosinophils Relative 1.6 0.0 - 5.0 %   Basophils Relative 1.2 0.0 - 3.0 %   Neutro Abs 4.3 1.4 - 7.7 K/uL   Lymphs Abs 2.2 0.7 - 4.0 K/uL   Monocytes Absolute 0.7 0.1 - 1.0 K/uL   Eosinophils Absolute 0.1 0.0 - 0.7 K/uL   Basophils Absolute 0.1 0.0 - 0.1 K/uL   *Note: Due to a large number of results and/or encounters for the requested time period, some results have not been displayed. A complete set of results can be found in Results Review.    Assessment & Plan:   Problem List Items Addressed This Visit     Palliative care by specialist (Chronic)    Appreciate palliative care assistance.  Daughter informs me outpatient palliative care has ended due to restructuring of [facility].      DNR (do not resuscitate) (Chronic)    DNR confirmed - they have goldenrod form.       Essential hypertension    BP mildly elevated, overall stable - continue low dose amlodipine, lasix, lisinopril and toprol XL '100mg'$  daily      RLS (restless legs syndrome)  Continue requip 2.'5mg'$  nighty, dosing 1-3 hours before bedtime      Peripheral neuropathy    Chronic severe mixed polyneuropathy on NCS 2020.  Continue gabapentin '400mg'$  in afternoon and evenings, cymbalta, ALA, with tramadol PRN       Relevant Medications   memantine (NAMENDA) 10 MG tablet   MCI (mild cognitive impairment) with memory loss - Primary    Reviewed latest MMSE actually showing improvement over 2 years ago.  Discussed uncertain benefits of namenda at this stage and option to discontinue. However given noted improvement, pt desires to continue medication - will increase to '10mg'$  BID.       Anorexia    Discussed noted decreased appetite, recommend continue to encourage regular meals as able.  Maintaining weight.       Other Visit Diagnoses     Need for influenza vaccination       Relevant Orders   Flu Vaccine QUAD High Dose(Fluad) (Completed)         Meds ordered this encounter  Medications   memantine (NAMENDA) 10 MG tablet    Sig: Take 1 tablet (10 mg total) by mouth 2 (two) times daily.    Dispense:  180 tablet    Refill:  3   Orders Placed This Encounter  Procedures   Flu Vaccine QUAD High Dose(Fluad)     Patient Instructions  Flu shot today  Memory testing is doing well. Let's increase namenda to '10mg'$  twice daily - new dose sent to pharmacy.  Good to see you today  Return as needed or in 4-6 months for follow up visit   Follow up plan: Return in about 4 months (around 06/02/2022) for follow up visit.  Ria Bush, MD

## 2022-01-30 NOTE — Assessment & Plan Note (Signed)
DNR confirmed.

## 2022-01-30 NOTE — Patient Instructions (Addendum)
Flu shot today  Memory testing is doing well. Let's increase namenda to '10mg'$  twice daily - new dose sent to pharmacy.  Good to see you today  Return as needed or in 4-6 months for follow up visit

## 2022-01-31 DIAGNOSIS — R630 Anorexia: Secondary | ICD-10-CM | POA: Insufficient documentation

## 2022-01-31 NOTE — Assessment & Plan Note (Signed)
BP mildly elevated, overall stable - continue low dose amlodipine, lasix, lisinopril and toprol XL '100mg'$  daily

## 2022-01-31 NOTE — Assessment & Plan Note (Addendum)
Discussed noted decreased appetite, recommend continue to encourage regular meals as able.  Maintaining weight.

## 2022-01-31 NOTE — Assessment & Plan Note (Signed)
Reviewed latest MMSE actually showing improvement over 2 years ago.  Discussed uncertain benefits of namenda at this stage and option to discontinue. However given noted improvement, pt desires to continue medication - will increase to '10mg'$  BID.

## 2022-01-31 NOTE — Assessment & Plan Note (Addendum)
Continue requip 2.'5mg'$  nighty, dosing 1-3 hours before bedtime

## 2022-01-31 NOTE — Assessment & Plan Note (Signed)
Chronic severe mixed polyneuropathy on NCS 2020.  Continue gabapentin '400mg'$  in afternoon and evenings, cymbalta, ALA, with tramadol PRN

## 2022-02-04 ENCOUNTER — Encounter (INDEPENDENT_AMBULATORY_CARE_PROVIDER_SITE_OTHER): Payer: Self-pay

## 2022-02-15 ENCOUNTER — Ambulatory Visit: Payer: Medicare Other | Attending: Cardiovascular Disease | Admitting: Nurse Practitioner

## 2022-02-15 ENCOUNTER — Other Ambulatory Visit
Admission: RE | Admit: 2022-02-15 | Discharge: 2022-02-15 | Disposition: A | Discharge status: Home / Self Care | Payer: Medicare Other | Source: Ambulatory Visit | Attending: Cardiovascular Disease | Admitting: Cardiovascular Disease

## 2022-02-15 ENCOUNTER — Encounter: Payer: Self-pay | Admitting: Nurse Practitioner

## 2022-02-15 VITALS — BP 130/84 | HR 76 | Ht 63.0 in | Wt 147.0 lb

## 2022-02-15 DIAGNOSIS — E782 Mixed hyperlipidemia: Secondary | ICD-10-CM

## 2022-02-15 DIAGNOSIS — I2510 Atherosclerotic heart disease of native coronary artery without angina pectoris: Secondary | ICD-10-CM | POA: Diagnosis not present

## 2022-02-15 DIAGNOSIS — I739 Peripheral vascular disease, unspecified: Secondary | ICD-10-CM | POA: Diagnosis not present

## 2022-02-15 DIAGNOSIS — R5383 Other fatigue: Secondary | ICD-10-CM | POA: Diagnosis not present

## 2022-02-15 DIAGNOSIS — I4821 Permanent atrial fibrillation: Secondary | ICD-10-CM | POA: Insufficient documentation

## 2022-02-15 DIAGNOSIS — R0609 Other forms of dyspnea: Secondary | ICD-10-CM | POA: Diagnosis not present

## 2022-02-15 DIAGNOSIS — I10 Essential (primary) hypertension: Secondary | ICD-10-CM | POA: Diagnosis not present

## 2022-02-15 DIAGNOSIS — I5032 Chronic diastolic (congestive) heart failure: Secondary | ICD-10-CM

## 2022-02-15 DIAGNOSIS — I340 Nonrheumatic mitral (valve) insufficiency: Secondary | ICD-10-CM | POA: Diagnosis not present

## 2022-02-15 LAB — CBC
HCT: 42.1 % (ref 36.0–46.0)
Hemoglobin: 14.3 g/dL (ref 12.0–15.0)
MCH: 32.8 pg (ref 26.0–34.0)
MCHC: 34 g/dL (ref 30.0–36.0)
MCV: 96.6 fL (ref 80.0–100.0)
Platelets: 217 10*3/uL (ref 150–400)
RBC: 4.36 MIL/uL (ref 3.87–5.11)
RDW: 12.8 % (ref 11.5–15.5)
WBC: 7.3 10*3/uL (ref 4.0–10.5)
nRBC: 0 % (ref 0.0–0.2)

## 2022-02-15 LAB — BASIC METABOLIC PANEL
Anion gap: 9 (ref 5–15)
BUN: 14 mg/dL (ref 8–23)
CO2: 26 mmol/L (ref 22–32)
Calcium: 8.8 mg/dL — ABNORMAL LOW (ref 8.9–10.3)
Chloride: 106 mmol/L (ref 98–111)
Creatinine, Ser: 0.88 mg/dL (ref 0.44–1.00)
GFR, Estimated: >60 mL/min (ref >60)
Glucose, Bld: 99 mg/dL (ref 70–99)
Potassium: 3.7 mmol/L (ref 3.5–5.1)
Sodium: 141 mmol/L (ref 135–145)

## 2022-02-15 LAB — TSH: TSH: 2.271 u[IU]/mL (ref 0.350–4.500)

## 2022-02-15 NOTE — Patient Instructions (Signed)
Medication Instructions:   Your physician recommends that you continue on your current medications as directed. Please refer to the Current Medication list given to you today.  *If you need a refill on your cardiac medications before your next appointment, please call your pharmacy*   Lab Work:  Your physician recommends you have lab work at the medical mall -- CBC, BMP, TSH  If you have labs (blood work) drawn today and your tests are completely normal, you will receive your results only by: MyChart Message (if you have MyChart) OR A paper copy in the mail If you have any lab test that is abnormal or we need to change your treatment, we will call you to review the results.   Testing/Procedures:  Echocardiogram   Your physician has requested that you have an echocardiogram. Echocardiography is a painless test that uses sound waves to create images of your heart. It provides your doctor with information about the size and shape of your heart and how well your heart's chambers and valves are working. This procedure takes approximately one hour. There are no restrictions for this procedure. Please note; depending on visual quality an IV may need to be placed.     Follow-Up: At [facility], you and your health needs are our priority.  As part of our continuing mission to provide you with exceptional heart care, we have created designated Provider Care Teams.  These Care Teams include your primary Cardiologist (physician) and Advanced Practice Providers (APPs -  Physician Assistants and Nurse Practitioners) who all work together to provide you with the care you need, when you need it.  We recommend signing up for the patient portal called "MyChart".  Sign up information is provided on this After Visit Summary.  MyChart is used to connect with patients for Virtual Visits (Telemedicine).  Patients are able to view lab/test results, encounter notes, upcoming appointments, etc.   Non-urgent messages can be sent to your provider as well.   To learn more about what you can do with MyChart, go to [email or web address].    Your next appointment:  As scheduled

## 2022-02-15 NOTE — Progress Notes (Signed)
Office Visit    Patient Name: [name] Date of Encounter: 02/15/2022  Primary Care Provider:  Ria Bush, MD Primary Cardiologist:  [name], MD  Chief Complaint    88 year old female with a history of permanent atrial fibrillation, nonobstructive CAD, hypertension, hyperlipidemia, peripheral arterial disease, moderate mitral regurgitation, permanent atrial fibrillation, and rheumatoid arthritis presents for follow-up related to fatigue and dyspnea.  Past Medical History    Past Medical History:  Diagnosis Date   Abdominal aortic atherosclerosis ([place]) 09/2015   by xray   Anxiety    BCC (basal cell carcinoma of skin) 05/2011   on nose   BCC (basal cell carcinoma of skin) 08/09/2020   upper chest, [facility] 12/27/20   Breast lesion    a. diagnosed with complex sclerosing lesion with calcifications of the right breast in 08/2016   Chest pain    a. nuclear stress test 03/2012: normal; b. 09/2018 MV: EF >65%, no isch/scar; c. 07/2019 Cath: LM nl, LAD 40ost, 74m LCX nl, RCA 50ost/p (iFR 0.98). EF 55-65% ->Med Rx.   DDD (degenerative disc disease), lumbosacral 12/06/1992   Gallstones 09/2015   incidentally by xray   GERD (gastroesophageal reflux disease) 05/1999   HERPES ZOSTER 04/13/2007   Qualifier: Diagnosis of  By: BMaxie BetterFNP, BRosalita Levan   Hiatal hernia    History of shingles    ophthalmic, takes acyclovir daily   Hyperlipidemia 12/2003   Hypertension 1990   Mitral regurgitation    a. echo 03/2012: EF 60-65%, no RWMA, mild to mod AI/MR, mod TR, PASP 37 mmHg; b. 05/2019 Echo: EF 60-65%, mod MR.   Osteoarthritis 08/21/1989   Osteoporosis with fracture 11/1997   with compression fracture T12   PAD (peripheral artery disease) ([place]    a. 01/2020 PTA of R Peroneal and R PT; b. 09/2021 ABI/TBI: R 1.04/09.2, L 1.36/0.17.   Permanent atrial fibrillation ([place] 03/16/2012   a. CHADS2VASc => 5 (HTN, age x 2, vascular disease, sex category)-->Eliquis 5 BID;  b.  Recurrent AF in 06/2016;  c. 01/2017 s/p DCCV;  d. 02/2017 recurrent AFib->Amio added->DCCV; e. 03/2017 recurrent AFib, s/p reload of amio and DCCV; f. 12/2020 DCCV-->recurrent Afib, Amio d/c-->rate control.   POSTHERPETIC NEURALGIA 04/20/2007   Qualifier: Diagnosis of  By: BMaxie BetterFNP, BRosalita Levan   Rheumatoid arthritis ([place]    RLS (restless legs syndrome)    SCC (squamous cell carcinoma) 05/01/2021   right posterior flank inferior to axilla - well differentiated squamous cell carcinoma with superificial infiltration, base involved. EDC 06/06/2021   Squamous cell carcinoma of skin 12/19/2021   [facility]ximal right dorsal hand. [facility]   Squamous cell carcinoma of skin 12/19/2021   Distal right dorsal [facility]. [facility]   Past Surgical History:  Procedure Laterality Date   ABDOMINAL HYSTERECTOMY  Age 88-- 23  S/P TAH   abdominal ultrasound  04/23/2004   Gallstones   BREAST EXCISIONAL BIOPSY Right 2018   neg/benign complex sclerosing lesion   BREAST LUMPECTOMY WITH RADIOACTIVE SEED LOCALIZATION Right 10/2016   breast lumpectomy with radioactive seed localization and margin assessment for complex sclerosing lesion ([place]   BREAST LUMPECTOMY WITH RADIOACTIVE SEED LOCALIZATION Right 11/05/2016   Procedure: RIGHT BREAST LUMPECTOMY WITH RADIOACTIVE SEED LOCALIZATION;  Surgeon: IFanny Skates MD;  [place]  Service: General;  Laterality: Right;   CARDIAC CATHETERIZATION     CARDIOVERSION N/A 01/31/2017   Procedure: CARDIOVERSION;  Surgeon: AKathlyn Sacramento  A, MD;  Location: [facility];  Service: Cardiovascular;  Laterality: N/A;   CARDIOVERSION N/A 03/03/2017   Procedure: CARDIOVERSION;  Surgeon: Wellington Hampshire, MD;  Location: [facility];  Service: Cardiovascular;  Laterality: N/A;   CARDIOVERSION N/A 04/02/2017   Procedure: CARDIOVERSION;  Surgeon: Wellington Hampshire, MD;  Location: [place] [facility];  Service: Cardiovascular;  Laterality: N/A;    CARDIOVERSION N/A 01/16/2018   Procedure: CARDIOVERSION;  Surgeon: Wellington Hampshire, MD;  Location: [facility];  Service: Cardiovascular;  Laterality: N/A;   CARDIOVERSION N/A 12/25/2020   Procedure: CARDIOVERSION;  Surgeon: Wellington Hampshire, MD;  Location: [facility];  Service: Cardiovascular;  Laterality: N/A;   CATARACT EXTRACTION  05/2010   left eye   CERVICAL DISCECTOMY  1992   Fusion (Dr. Joya Salm)   ERCP N/A 10/30/2020   Procedure: ENDOSCOPIC RETROGRADE CHOLANGIOPANCREATOGRAPHY (ERCP);  Surgeon: Jackquline Denmark, MD;  Location[facility] END[facility];  Service: Endoscopy;  Laterality: N/A;   ESI Bilateral 01/2016   S1 transforaminal ESI x3   ESOPHAGOGASTRODUODENOSCOPY  01/01/2002   with ulcer, bx. neg; + stricture gastric ulcer with hemorrhage   ESOPHAGOGASTRODUODENOSCOPY  04/23/2004   Esophageal stricture -- dilated   INTRAVASCULAR PRESSURE WIRE/FFR STUDY N/A 07/12/2019   Procedure: INTRAVASCULAR PRESSURE WIRE/FFR STUDY;  Surgeon: Wellington Hampshire, MD;  Location: [place] CV LAB;  Service: Cardiovascular;  Laterality: N/A;   LOWER EXTREMITY ANGIOGRAPHY Left 02/01/2019   LOWER EXTREMITY ANGIOGRAPHY;  Surgeon: Algernon Huxley, MD   LOWER EXTREMITY ANGIOGRAPHY Right 03/25/2019   Procedure: LOWER EXTREMITY ANGIOGRAPHY;  Surgeon: Algernon Huxley, MD;  Location: [place] CV LAB;  Service: Cardiovascular;  Laterality: Right;   LOWER EXTREMITY ANGIOGRAPHY Right 08/02/2019   Procedure: LOWER EXTREMITY ANGIOGRAPHY;  Surgeon: Algernon Huxley, MD;  Location: [place] CV LAB;  Service: Cardiovascular;  Laterality: Right;   nuclear stress test  03/2012   no ischemia   PERIPHERAL VASCULAR BALLOON ANGIOPLASTY Left 01/2019   Percutaneous transluminal angioplasty of left anterior and posterior tibial artery with 2.5 mm diameter by 22 cm length angioplasty balloon ([place])   REMOVAL OF STONES  10/30/2020   Procedure: REMOVAL OF STONES;  Surgeon: Jackquline Denmark, MD;  [facility];  Service: Endoscopy;;    RIGHT/LEFT HEART CATH AND CORONARY ANGIOGRAPHY Bilateral 07/12/2019   Procedure: RIGHT/LEFT HEART CATH AND CORONARY ANGIOGRAPHY;  Surgeon: Wellington Hampshire, MD;  Location: [place] CV LAB;  Service: Cardiovascular;  Laterality: Bilateral;   SKIN CANCER EXCISION  05/20/2011   BCC from nose   SPHINCTEROTOMY  10/30/2020   Procedure: SPHINCTEROTOMY;  Surgeon: Jackquline Denmark, MD;  Location: [facility] ENDOSCOPY;  Service: Endoscopy;;   US ECHOCARDIOGRAPHY  03/2012   in flutter, EF 60%, mild-mod aortic, mitral, tricuspid regurg, mildly dilated LA    Allergies  Allergies  Allergen Reactions   Penicillins Rash and Other (See Comments)    Childhood Allergy Has patient had a PCN reaction causing immediate rash, facial/tongue/throat swelling, SOB or lightheadedness with hypotension: Yes Has patient had a PCN reaction causing severe rash involving mucus membranes or skin necrosis: Unknown Has patient had a PCN reaction that required hospitalization: No Has patient had a PCN reaction occurring within the last 10 years: No If all of the above answers are "NO", then may proceed with Cephalosporin use.     History of Present Illness    88 year old female with a history of permanent atrial fibrillation, nonobstructive CAD, hypertension, hyperlipidemia, peripheral arterial disease, moderate mitral regurgitation, permanent atrial fibrillation, and rheumatoid arthritis.  Echocardiogram in fibber 2021 showed normal LV systolic function with grade 2 diastolic dysfunction, severe pulmonary hypertension, moderate MR, and moderate TR.  She underwent diagnostic catheterization in April 2021 which showed mild to moderate nonobstructive CAD including a 50% ostial RCA stenosis that was not significant by fractional flow reserve.  Right heart catheterization showed mildly elevated filling pressures with mild pulmonary hypertension and low normal cardiac output.  In July 2021, she fell and fractured her right wrist.  She had  acute on chronic renal failure requiring discontinuation of furosemide therapy.  In October 2021, she underwent PTA of the right peroneal and posterior tibial arteries by vascular surgery.  In June 2022, she had COVID and then was subsequently hospitalized in July 2022 with cholelithiasis.  She was found to have elevated LFTs and mild lactic acidosis.  She was septic with E. coli.  She was diagnosed with ascending cholangitis and was treated with antibiotics.  In September 2022, she was noted to be in atrial fibrillation and her amiodarone dose was increased and she subsequently underwent successful cardioversion.  However, upon follow-up, she was noted to be back in atrial fibrillation and decision was made to pursue rate control.  Amiodarone was discontinued at that time and metoprolol has since been titrated.  Ms. Knisley was last seen in cardiology clinic in May 2023 at which time she noted improved dyspnea with ongoing exertional palpitations and mild lower extremity swelling.  Metoprolol XL was increased to 100 mg twice daily and Eliquis was increased to 5 mg twice daily in the setting of higher weights and stable creatinine less than 1.5.  Amlodipine therapy was discontinued given titration of beta-blocker.  With these medication changes, she has not experienced any recurrence of palpitations and has also noted improvement in lower extremity swelling.  She might still swell if she keeps her legs in a dependent position for prolonged period of time but overall, this has improved.  Over the past several months, she has noted worsening fatigue as well as dyspnea on exertion.  She can do very little before tiring.  She believes that she sleeps well at night and feels well rested in the morning.  She denies chest pain, PND, orthopnea, dizziness, syncope, or early satiety.  Home Medications    Current Outpatient Medications  Medication Sig Dispense Refill   acetaminophen (TYLENOL) 500 MG tablet Take 500  mg by mouth every 6 (six) hours as needed for mild pain.     acyclovir (ZOVIRAX) 400 MG tablet Take 400 mg by mouth 2 (two) times daily.     Alpha-Lipoic Acid 600 MG TABS Take 600 mg by mouth daily.     amLODipine (NORVASC) 2.5 MG tablet Take 1 tablet (2.5 mg total) by mouth daily. 90 tablet 1   atorvastatin (LIPITOR) 20 MG tablet Take 1 tablet (20 mg total) by mouth daily. 90 tablet 3   Docusate Calcium (STOOL SOFTENER PO) Take 1 capsule by mouth daily.     DULoxetine (CYMBALTA) 60 MG capsule TAKE 1 CAPSULE BY MOUTH ONCE DAILY 90 capsule 2   ELIQUIS 5 MG TABS tablet TAKE 1 TABLET BY MOUTH TWICE DAILY 180 tablet 1   furosemide (LASIX) 20 MG tablet Take 1 tablet (20 mg total) by mouth daily. 90 tablet 0   gabapentin (NEURONTIN) 400 MG capsule TAKE 1 CAPSULE BY MOUTH AT BEDTIME AND SECOND DOSE DURING THE DAY AS NEEDED 120 capsule 3   hydrocortisone (ANUSOL-HC) 2.5 % rectal cream Place 1 application rectally daily  as needed for hemorrhoids or anal itching. For hemmorhoid 30 g 0   lisinopril (ZESTRIL) 20 MG tablet TAKE ONE TABLET BY MOUTH ONCE DAILY 90 tablet 0   memantine (NAMENDA) 10 MG tablet Take 1 tablet (10 mg total) by mouth 2 (two) times daily. 180 tablet 3   metoprolol succinate (TOPROL-XL) 100 MG 24 hr tablet Take 1 tablet (100 mg total) by mouth 2 (two) times daily. 180 tablet 1   pantoprazole (PROTONIX) 40 MG tablet Take 1 tablet (40 mg total) by mouth daily. 90 tablet 3   polyethylene glycol (MIRALAX / GLYCOLAX) 17 g packet Take 17 g by mouth daily as needed for mild constipation. 14 each 0   prednisoLONE acetate (PRED FORTE) 1 % ophthalmic suspension Place 1 drop into the left eye daily.      rOPINIRole (REQUIP) 1 MG tablet Take 2.5 tablets (2.5 mg total) by mouth at bedtime. 225 tablet 3   tiZANidine (ZANAFLEX) 2 MG tablet Take 2 mg by mouth daily.     traMADol (ULTRAM) 50 MG tablet Take 1-2 tablets (50-100 mg total) by mouth at bedtime as needed. 30 tablet 0   Vitamin D,  Ergocalciferol, (DRISDOL) 1.25 MG (50000 UNIT) CAPS capsule Take 1 capsule (50,000 Units total) by mouth every 7 (seven) days. 12 capsule 3   No current facility-administered medications for this visit.     Review of Systems    Some dependent edema but overall much improved since stopping amlodipine.  Worsening fatigue and dyspnea on exertion as outlined above.  She denies chest pain, palpitations, PND, orthopnea, dizziness, syncope, or early satiety.  All other systems reviewed and are otherwise negative except as noted above.    Physical Exam    VS:  BP 130/84 (BP Location: Left Arm, Patient Position: Sitting, Cuff Size: Normal)   Pulse 76   Ht '5\' 3"'$  (1.6 m)   Wt 147 lb (66.7 kg)   SpO2 93%   BMI 26.04 kg/m  , BMI Body mass index is 26.04 kg/m.     GEN: Well nourished, well developed, in no acute distress. HEENT: normal. Neck: Supple, no JVD, carotid bruits, or masses. Cardiac: Irregularly irregular, distant, no murmurs, rubs, or gallops. No clubbing, cyanosis, edema.  Radials/PT 2+ and equal bilaterally.  Respiratory:  Respirations regular and unlabored, clear to auscultation bilaterally. GI: Soft, nontender, nondistended, BS + x 4. MS: no deformity or atrophy. Skin: warm and dry, no rash. Neuro:  Strength and sensation are intact. Psych: Normal affect.  Accessory Clinical Findings    ECG personally reviewed by me today -atrial fibrillation, 76, slightly more pronounced T wave inversion V1 V2 with mild anterolateral ST depression.  Lab Results  Component Value Date   WBC 7.3 02/15/2022   HGB 14.3 02/15/2022   HCT 42.1 02/15/2022   MCV 96.6 02/15/2022   PLT 217 02/15/2022   Lab Results  Component Value Date   CREATININE 0.88 02/15/2022   BUN 14 02/15/2022   NA 141 02/15/2022   K 3.7 02/15/2022   CL 106 02/15/2022   CO2 26 02/15/2022   Lab Results  Component Value Date   ALT 10 09/12/2021   AST 15 09/12/2021   ALKPHOS 70 09/12/2021   BILITOT 0.7 09/12/2021    Lab Results  Component Value Date   CHOL 148 09/12/2021   HDL 52.40 09/12/2021   LDLCALC 70 09/12/2021   LDLDIRECT 173.1 02/19/2010   TRIG 124.0 09/12/2021   CHOLHDL 3 09/12/2021    Lab  Results  Component Value Date   TSH 2.271 02/15/2022    Assessment & Plan    1.  Dyspnea on exertion/fatigue: Patient with progressive dyspnea exertion and fatigue over the past several months.  She has had improvement in lower extremity swelling since discontinuation of amlodipine.  She does have a history of moderate mitral regurgitation.  She is euvolemic on examination.  I obtain lab work today which shows normal H&H, renal function, electrolytes, and TSH.  I will follow-up a 2D echocardiogram to reevaluate LV function and mitral regurgitation.  2.  Permanent atrial fibrillation: Now permanent following ERAF after cardioversion in September 2022.  Well rate controlled at 76 bpm on metoprolol therapy.  She is anticoagulated with Eliquis with stable renal function and blood counts today.  Unclear as to the extent that this may be playing a role in her dyspnea and fatigue.  Follow-up echo as above.  3.  Chronic HFpEF: Progressive dyspnea though she is euvolemic on examination with stable heart rate and blood pressure.  She is on low-dose Lasix with stable lab work today.  Follow-up echo as outlined above.  4.  Moderate mitral regurgitation: No significant murmur on examination.  Follow-up echo.  5.  Essential hypertension: Stable.  6.  Hyperlipidemia: LDL of 70 in June 2023, with normal LFTs.  Continue statin therapy.  7.  Nonobstructive CAD: Status post catheterization 2021 with moderate, nonobstructive disease.  She has not been having any chest pain.  ECG with slightly more pronounced T wave inversion in V1 and V2.  Following up echo as outlined above.  She remains on beta-blocker, calcium channel blocker, ACE inhibitor, and statin therapy.  No aspirin in the setting of chronic Eliquis.  8.   Peripheral arterial disease: Followed by vascular surgery.  Denies claudication.  She remains on statin therapy.  9.  Disposition: Follow-up 2D echocardiogram.  Follow-up in clinic in 1 month or sooner if necessary.  Murray Hodgkins, NP 02/15/2022, 4:22 PM

## 2022-02-20 ENCOUNTER — Other Ambulatory Visit (INDEPENDENT_AMBULATORY_CARE_PROVIDER_SITE_OTHER): Payer: Self-pay | Admitting: Vascular Surgery

## 2022-02-20 ENCOUNTER — Telehealth: Payer: Self-pay | Admitting: Family Medicine

## 2022-02-20 ENCOUNTER — Ambulatory Visit (INDEPENDENT_AMBULATORY_CARE_PROVIDER_SITE_OTHER): Payer: Medicare Other | Admitting: Nurse Practitioner

## 2022-02-20 ENCOUNTER — Ambulatory Visit (INDEPENDENT_AMBULATORY_CARE_PROVIDER_SITE_OTHER): Payer: Medicare Other

## 2022-02-20 ENCOUNTER — Encounter (INDEPENDENT_AMBULATORY_CARE_PROVIDER_SITE_OTHER): Payer: Self-pay | Admitting: Nurse Practitioner

## 2022-02-20 VITALS — BP 177/85 | HR 62 | Resp 16 | Wt 146.2 lb

## 2022-02-20 DIAGNOSIS — I739 Peripheral vascular disease, unspecified: Secondary | ICD-10-CM

## 2022-02-20 DIAGNOSIS — Z9889 Other specified postprocedural states: Secondary | ICD-10-CM | POA: Diagnosis not present

## 2022-02-20 DIAGNOSIS — I10 Essential (primary) hypertension: Secondary | ICD-10-CM

## 2022-02-20 DIAGNOSIS — E782 Mixed hyperlipidemia: Secondary | ICD-10-CM | POA: Diagnosis not present

## 2022-02-20 NOTE — Telephone Encounter (Signed)
I usually strongly recommend it for those with lung issues or immunocompromised.  Otherwise it's pt preference.  In her case, reasonable to get shot reasonable also to defer this year.

## 2022-02-20 NOTE — Telephone Encounter (Signed)
Patient daughter called and stated if patient needs to take the RSV shot. Call back [phone].

## 2022-02-20 NOTE — Telephone Encounter (Signed)
Spoke with pt's daughter, [name] (on dpr), relaying Dr. Synthia Innocent message.  Verbalizes understanding and will discuss with pt to make a decision.

## 2022-03-10 ENCOUNTER — Encounter (INDEPENDENT_AMBULATORY_CARE_PROVIDER_SITE_OTHER): Payer: Self-pay | Admitting: Nurse Practitioner

## 2022-03-10 NOTE — Progress Notes (Signed)
Subjective:    Patient ID: [name], female    DOB: 09/11/1929, 88 y.o.   MRN: [id number] Chief Complaint  Patient presents with   Follow-up    Ultrasound follow up    The patient returns to the office for followup and review of the noninvasive studies.   There have been no interval changes in lower extremity symptoms. No interval shortening of the patient's claudication distance or development of rest pain symptoms. No new ulcers or wounds have occurred since the last visit.  The patient does have some discoloration of her lower extremities however this is baseline for the patient has been present for a long time.  There have been no significant changes to the patient's overall health care.  The patient denies amaurosis fugax or recent TIA symptoms. There are no documented recent neurological changes noted. There is no history of DVT, PE or superficial thrombophlebitis. The patient denies recent episodes of angina or shortness of breath.   ABI Rt=0.96 and Lt=1.41  (previous ABI's Rt=1.04 and Lt=1.36) Arterial duplex of the left lower extremity shows biphasic waveforms throughout.  The patient has triphasic/biphasic waveforms in the right lower extremity tibial vessels.    Review of Systems  Skin:  Positive for color change. Negative for wound.  Neurological:  Positive for weakness.  All other systems reviewed and are negative.      Objective:   Physical Exam Vitals reviewed.  HENT:     Head: Normocephalic.  Cardiovascular:     Rate and Rhythm: Normal rate.     Pulses:          Dorsalis pedis pulses are detected w/ Doppler on the right side and detected w/ Doppler on the left side.       Posterior tibial pulses are detected w/ Doppler on the right side and detected w/ Doppler on the left side.  Pulmonary:     Effort: Pulmonary effort is normal.  Skin:    General: Skin is warm and dry.  Neurological:     Mental Status: She is alert and oriented to person, place,  and time.  Psychiatric:        Mood and Affect: Mood normal.        Behavior: Behavior normal.        Thought Content: Thought content normal.        Judgment: Judgment normal.     BP (!) 177/85 (BP Location: Right Arm)   Pulse 62   Resp 16   Wt 146 lb 3.2 oz (66.3 kg)   BMI 25.90 kg/m   Past Medical History:  Diagnosis Date   Abdominal aortic atherosclerosis ([place]) 09/2015   by xray   Anxiety    BCC (basal cell carcinoma of skin) 05/2011   on nose   BCC (basal cell carcinoma of skin) 08/09/2020   upper chest, [facility] 12/27/20   Breast lesion    a. diagnosed with complex sclerosing lesion with calcifications of the right breast in 08/2016   Chest pain    a. nuclear stress test 03/2012: normal; b. 09/2018 MV: EF >65%, no isch/scar; c. 07/2019 Cath: LM nl, LAD 40ost, 53m LCX nl, RCA 50ost/p (iFR 0.98). EF 55-65% ->Med Rx.   DDD (degenerative disc disease), lumbosacral 12/06/1992   Gallstones 09/2015   incidentally by xray   GERD (gastroesophageal reflux disease) 05/1999   HERPES ZOSTER 04/13/2007   Qualifier: Diagnosis of  By: BMaxie BetterFNP, BRosalita Levan   Hiatal hernia  History of shingles    ophthalmic, takes acyclovir daily   Hyperlipidemia 12/2003   Hypertension 1990   Mitral regurgitation    a. echo 03/2012: EF 60-65%, no RWMA, mild to mod AI/MR, mod TR, PASP 37 mmHg; b. 05/2019 Echo: EF 60-65%, mod MR.   Osteoarthritis 08/21/1989   Osteoporosis with fracture 11/1997   with compression fracture T12   PAD (peripheral artery disease) ([place])    a. 01/2020 PTA of R Peroneal and R PT; b. 09/2021 ABI/TBI: R 1.04/09.2, L 1.36/0.17.   Permanent atrial fibrillation ([place]) 03/16/2012   a. CHADS2VASc => 5 (HTN, age x 2, vascular disease, sex category)-->Eliquis 5 BID;  b. Recurrent AF in 06/2016;  c. 01/2017 s/p DCCV;  d. 02/2017 recurrent AFib->Amio added->DCCV; e. 03/2017 recurrent AFib, s/p reload of amio and DCCV; f. 12/2020 DCCV-->recurrent Afib, Amio d/c-->rate control.    POSTHERPETIC NEURALGIA 04/20/2007   Qualifier: Diagnosis of  By: [name], [name]    Rheumatoid arthritis ([place])    RLS (restless legs syndrome)    SCC (squamous cell carcinoma) 05/01/2021   right posterior flank inferior to axilla - well differentiated squamous cell carcinoma with superificial infiltration, base involved. [facility] 06/06/2021   Squamous cell carcinoma of skin 12/19/2021   Proximal right dorsal hand. KA type. [facility] 12/19/2021   Squamous cell carcinoma of skin 12/19/2021   Distal right dorsal hand. KA type. [facility] 12/19/2021    Social History   Socioeconomic History   Marital status: Widowed    Spouse name: Not on file   Number of children: 5   Years of education: Not on file   Highest education level: Not on file  Occupational History   Occupation: Retired - Occupational psychologist: RETIRED  Tobacco Use   Smoking status: Former    Packs/day: 0.25    Years: 1.00    Total pack years: 0.25    Types: Cigarettes    Quit date: 04/08/1976    Years since quitting: 45.9   Smokeless tobacco: Never   Tobacco comments:    pt smokes occasionally "socially"  Vaping Use   Vaping Use: Never used  Substance and Sexual Activity   Alcohol use: No    Alcohol/week: 1.0 standard drink of alcohol    Types: 1 Glasses of wine per week   Drug use: No   Sexual activity: Never  Other Topics Concern   Not on file  Social History Narrative   Lives with grandson who smokes, dog   From [place].  Widowed 01/1999; 1st marriage 25 years / 2nd marriage 13 years.   One son deceased January 23, 2023.   Activity: active with dog   Diet: good water, fruits/vegetables daily   Social Determinants of Health   Financial Resource Strain: Low Risk  (08/27/2021)   Overall Financial Resource Strain (CARDIA)    Difficulty of Paying Living Expenses: Not very hard  Food Insecurity: No Food Insecurity (08/27/2021)   Hunger Vital Sign    Worried About Running Out of Food in the Last Year: Never true    Ran Out of  Food in the Last Year: Never true  Transportation Needs: No Transportation Needs (08/27/2021)   PRAPARE - Hydrologist (Medical): No    Lack of Transportation (Non-Medical): No  Physical Activity: Inactive (08/27/2021)   Exercise Vital Sign    Days of Exercise per Week: 0 days    Minutes of Exercise per Session: 0 min  Stress: No Stress Concern  Present (08/27/2021)   [place]    Feeling of Stress : Only a little  Social Connections: Socially Isolated (08/27/2021)   Social Connection and Isolation Panel [NHANES]    Frequency of Communication with Friends and Family: More than three times a week    Frequency of Social Gatherings with Friends and Family: More than three times a week    Attends Religious Services: Never    Marine scientist or Organizations: No    Attends Archivist Meetings: Never    Marital Status: Widowed  Intimate Partner Violence: Not At Risk (08/27/2021)   Humiliation, Afraid, Rape, and Kick questionnaire    Fear of Current or Ex-Partner: No    Emotionally Abused: No    Physically Abused: No    Sexually Abused: No    Past Surgical History:  Procedure Laterality Date   ABDOMINAL HYSTERECTOMY  Age 69 - 50   S/P TAH   abdominal ultrasound  04/23/2004   Gallstones   BREAST EXCISIONAL BIOPSY Right 2018   neg/benign complex sclerosing lesion   BREAST LUMPECTOMY WITH RADIOACTIVE SEED LOCALIZATION Right 10/2016   breast lumpectomy with radioactive seed localization and margin assessment for complex sclerosing lesion ([place])   BREAST LUMPECTOMY WITH RADIOACTIVE SEED LOCALIZATION Right 11/05/2016   Procedure: RIGHT BREAST LUMPECTOMY WITH RADIOACTIVE SEED LOCALIZATION;  Surgeon: Fanny Skates, MD;  Location: [place];  Service: General;  Laterality: Right;   CARDIAC CATHETERIZATION     CARDIOVERSION N/A 01/31/2017   Procedure: CARDIOVERSION;   Surgeon: Wellington [place], MD;  Location: [facility];  Service: Cardiovascular;  Laterality: N/A;   CARDIOVERSION N/A 03/03/2017   Procedure: CARDIOVERSION;  Surgeon: Wellington [place], MD;  Location: [facility];  Service: Cardiovascular;  Laterality: N/A;   CARDIOVERSION N/A 04/02/2017   Procedure: CARDIOVERSION;  Surgeon: Wellington [place], MD;  Location: [facility];  Service: Cardiovascular;  Laterality: N/A;   CARDIOVERSION N/A 01/16/2018   Procedure: CARDIOVERSION;  Surgeon: Wellington [place], MD;  Location: [facility];  Service: Cardiovascular;  Laterality: N/A;   CARDIOVERSION N/A 12/25/2020   Procedure: CARDIOVERSION;  Surgeon: Wellington [place], MD;  Location: [facility];  Service: Cardiovascular;  Laterality: N/A;   CATARACT EXTRACTION  05/2010   left eye   CERVICAL DISCECTOMY  1992   Fusion (Dr. Joya Salm)   ERCP N/A 10/30/2020   Procedure: ENDOSCOPIC RETROGRADE CHOLANGIOPANCREATOGRAPHY (ERCP);  Surgeon: Jackquline [place], MD;  Location: [facility];  Service: Endoscopy;  Laterality: N/A;   ESI Bilateral 01/2016   S1 transforaminal ESI x3   ESOPHAGOGASTRODUODENOSCOPY  01/01/2002   with ulcer, bx. neg; + stricture gastric ulcer with hemorrhage   ESOPHAGOGASTRODUODENOSCOPY  04/23/2004   Esophageal stricture -- dilated   INTRAVASCULAR PRESSURE WIRE/FFR STUDY N/A 07/12/2019   Procedure: INTRAVASCULAR PRESSURE WIRE/FFR STUDY;  Surgeon: Wellington [place], MD;  Location: [facility];  Service: Cardiovascular;  Laterality: N/A;   LOWER EXTREMITY ANGIOGRAPHY Left 02/01/2019   LOWER EXTREMITY ANGIOGRAPHY;  Surgeon: Algernon Huxley, MD   LOWER EXTREMITY ANGIOGRAPHY Right 03/25/2019   Procedure: LOWER EXTREMITY ANGIOGRAPHY;  Surgeon: Algernon Huxley, MD;  Location: [place] [facility];  Service: Cardiovascular;  Laterality: Right;   LOWER EXTREMITY ANGIOGRAPHY Right 08/02/2019   Procedure: LOWER EXTREMITY ANGIOGRAPHY;  Surgeon: Algernon Huxley, MD;  Location: [place] [facility];  Service: Cardiovascular;   Laterality: Right;   nuclear stress test  03/2012   no ischemia   PERIPHERAL  VASCULAR BALLOON ANGIOPLASTY Left 01/2019   Percutaneous transluminal angioplasty of left anterior and posterior tibial artery with 2.5 mm diameter by 22 cm length angioplasty balloon ([place])   REMOVAL OF STONES  10/30/2020   Procedure: REMOVAL OF STONES;  Surgeon: Jackquline Denmark, MD;  Location: [facility] END[facility];  Service: Endoscopy;;   RIGHT/LEFT HEART CATH AND CORONARY ANGIOGRAPHY Bilateral 07/12/2019   Procedure: RIGHT/LEFT HEART CATH AND CORONARY ANGIOGRAPHY;  Surgeon: Wellington [place], MD;  Location: [place] [facility];  Service: Cardiovascular;  Laterality: Bilateral;   SKIN CANCER EXCISION  05/20/2011   BCC from nose   SPHINCTEROTOMY  10/30/2020   Procedure: SPHINCTEROTOMY;  Surgeon: Jackquline Denmark, MD;  Location: [facility] END[facility];  Service: Endoscopy;;   US ECHOCARDIOGRAPHY  03/2012   in flutter, EF 60%, mild-mod aortic, mitral, tricuspid regurg, mildly dilated LA    Family History  Problem Relation Age of Onset   Emphysema Mother    Heart disease Father        MI   Stroke Father        first at age 1.   Stroke Sister    Diabetes Sister    Hypertension Brother    Heart disease Brother        Heart Valve   Stroke Brother    Diabetes Brother    Cancer Brother        esophageal   Diabetes Brother    Diabetes Brother    Colon cancer Neg Hx     Allergies  Allergen Reactions   Penicillins Rash and Other (See Comments)    Childhood Allergy Has patient had a PCN reaction causing immediate rash, facial/tongue/throat swelling, SOB or lightheadedness with hypotension: Yes Has patient had a PCN reaction causing severe rash involving mucus membranes or skin necrosis: Unknown Has patient had a PCN reaction that required hospitalization: No Has patient had a PCN reaction occurring within the last 10 years: No If all of the above answers are "NO", then may proceed with Cephalosporin use.        Latest Ref Rng &  Units 02/15/2022    3:15 PM 09/12/2021    4:12 PM 05/30/2021   12:53 PM  CBC  WBC 4.0 - 10.5 K/uL 7.3  7.3  7.1   Hemoglobin 12.0 - 15.0 g/dL 14.3  15.0  13.5   Hematocrit 36.0 - 46.0 % 42.1  44.4  40.9   Platelets 150 - 400 K/uL 217  228.0  232.0       CMP     Component Value Date/Time   NA 141 02/15/2022 1515   NA 143 12/21/2020 1546   K 3.7 02/15/2022 1515   CL 106 02/15/2022 1515   CO2 26 02/15/2022 1515   GLUCOSE 99 02/15/2022 1515   BUN 14 02/15/2022 1515   BUN 15 12/21/2020 1546   CREATININE 0.88 02/15/2022 1515   CREATININE 0.81 01/30/2018 1558   CALCIUM 8.8 (L) 02/15/2022 1515   PROT 6.9 09/12/2021 1612   PROT 6.1 01/07/2018 1515   ALBUMIN 4.0 09/12/2021 1612   ALBUMIN 4.1 01/07/2018 1515   AST 15 09/12/2021 1612   ALT 10 09/12/2021 1612   ALKPHOS 70 09/12/2021 1612   BILITOT 0.7 09/12/2021 1612   BILITOT 0.5 01/07/2018 1515   GFRNONAA >60 02/15/2022 1515   GFRAA 43 (L) 10/27/2019 0452     VAS [place] ABI WITH/WO TBI  Result Date: 10/05/2021  LOWER EXTREMITY DOPPLER STUDY Patient Name:  [name]  Date of Exam:  10/03/2021 Medical Rec #: [id number]        Accession #:    [id number] Date of Birth: 02/14/1930        Patient Gender: F Patient Age:   73 years Exam Location:  [facility] Procedure:      VAS [place] ABI WITH/WO TBI Referring Phys: Leotis Pain --------------------------------------------------------------------------------  Indications: Peripheral artery disease.  Performing Technologist: Almira Coaster RVS  Examination Guidelines: A complete evaluation includes at minimum, Doppler waveform signals and systolic blood pressure reading at the level of bilateral brachial, anterior tibial, and posterior tibial arteries, when vessel segments are accessible. Bilateral testing is considered an integral part of a complete examination. Photoelectric Plethysmograph (PPG) waveforms and toe systolic pressure readings are included as required and additional duplex  testing as needed. Limited examinations for reoccurring indications may be performed as noted.  ABI Findings: +---------+------------------+-----+---------+--------+ Right    Rt Pressure (mmHg)IndexWaveform Comment  +---------+------------------+-----+---------+--------+ Brachial 134                                      +---------+------------------+-----+---------+--------+ ATA      144               1.04 triphasic         +---------+------------------+-----+---------+--------+ PTA      123               0.88 biphasic          +---------+------------------+-----+---------+--------+ Great Toe128               0.92 Normal            +---------+------------------+-----+---------+--------+ +---------+------------------+-----+---------+-------+ Left     Lt Pressure (mmHg)IndexWaveform Comment +---------+------------------+-----+---------+-------+ Brachial 139                                     +---------+------------------+-----+---------+-------+ ATA      152               1.09 triphasic        +---------+------------------+-----+---------+-------+ PTA      189               1.36 triphasic        +---------+------------------+-----+---------+-------+ Great Toe23                0.17 Abnormal         +---------+------------------+-----+---------+-------+ +-------+-----------+-----------+------------+------------+ ABI/TBIToday's ABIToday's TBIPrevious ABIPrevious TBI +-------+-----------+-----------+------------+------------+ Right  1.04       .92        1.18        .68          +-------+-----------+-----------+------------+------------+ Left   1.36       .17        1.18        .43          +-------+-----------+-----------+------------+------------+ Bilateral ABIs appear essentially unchanged compared to prior study on 04/04/2021. Right TBIs appear increased compared to prior study on 04/04/2021. Lt TBIs appear to be decreased compared to prior  study on 04/04/2021.  Summary: Right: Resting right ankle-brachial index is within normal range. No evidence of significant right lower extremity arterial disease. The right toe-brachial index is normal. Left: Resting left ankle-brachial index is within normal range. No evidence of significant left lower extremity arterial disease. The left toe-brachial index is abnormal.  *  See table(s) above for measurements and observations.  Electronically signed by Leotis Pain MD on 10/05/2021 at 11:33:27 AM.    Final        Assessment & Plan:   1. Peripheral arterial disease with history of revascularization (HCC)  Recommend:  The patient has evidence of atherosclerosis of the lower extremities with claudication.  The patient does not voice lifestyle limiting changes at this point in time.  The patient does have decreased toe waveforms in the left lower extremity.  However, she does not note any extensive pain or open wounds or ulcerations.  She does have discoloration but the patient notes that this discoloration has been present for years.  Given the patient's advanced age, we will proceed conservatively.  She will return in 3 months for reevaluation. - VAS [place] ABI WITH/WO TBI  2. Essential hypertension Continue antihypertensive medications as already ordered, these medications have been reviewed and there are no changes at this time.   3. Mixed hyperlipidemia Continue statin as ordered and reviewed, no changes at this time    Current Outpatient Medications on File Prior to Visit  Medication Sig Dispense Refill   acetaminophen (TYLENOL) 500 MG tablet Take 500 mg by mouth every 6 (six) hours as needed for mild pain.     acyclovir (ZOVIRAX) 400 MG tablet Take 400 mg by mouth 2 (two) times daily.     Alpha-Lipoic Acid 600 MG TABS Take 600 mg by mouth daily.     amLODipine (NORVASC) 2.5 MG tablet Take 1 tablet (2.5 mg total) by mouth daily. 90 tablet 1   atorvastatin (LIPITOR) 20 MG tablet Take 1 tablet (20  mg total) by mouth daily. 90 tablet 3   Docusate Calcium (STOOL SOFTENER PO) Take 1 capsule by mouth daily.     DULoxetine (CYMBALTA) 60 MG capsule TAKE 1 CAPSULE BY MOUTH ONCE DAILY 90 capsule 2   ELIQUIS 5 MG TABS tablet TAKE 1 TABLET BY MOUTH TWICE DAILY 180 tablet 1   furosemide (LASIX) 20 MG tablet Take 1 tablet (20 mg total) by mouth daily. 90 tablet 0   gabapentin (NEURONTIN) 400 MG capsule TAKE 1 CAPSULE BY MOUTH AT BEDTIME AND SECOND DOSE DURING THE DAY AS NEEDED 120 capsule 3   hydrocortisone (ANUSOL-HC) 2.5 % rectal cream Place 1 application rectally daily as needed for hemorrhoids or anal itching. For hemmorhoid 30 g 0   lisinopril (ZESTRIL) 20 MG tablet TAKE ONE TABLET BY MOUTH ONCE DAILY 90 tablet 0   memantine (NAMENDA) 10 MG tablet Take 1 tablet (10 mg total) by mouth 2 (two) times daily. 180 tablet 3   metoprolol succinate (TOPROL-XL) 100 MG 24 hr tablet Take 1 tablet (100 mg total) by mouth 2 (two) times daily. 180 tablet 1   pantoprazole (PROTONIX) 40 MG tablet Take 1 tablet (40 mg total) by mouth daily. 90 tablet 3   polyethylene glycol (MIRALAX / GLYCOLAX) 17 g packet Take 17 g by mouth daily as needed for mild constipation. 14 each 0   prednisoLONE acetate (PRED FORTE) 1 % ophthalmic suspension Place 1 drop into the left eye daily.      rOPINIRole (REQUIP) 1 MG tablet Take 2.5 tablets (2.5 mg total) by mouth at bedtime. 225 tablet 3   tiZANidine (ZANAFLEX) 2 MG tablet Take 2 mg by mouth daily.     traMADol (ULTRAM) 50 MG tablet Take 1-2 tablets (50-100 mg total) by mouth at bedtime as needed. 30 tablet 0   Vitamin D, Ergocalciferol, (  DRISDOL) 1.25 MG (50000 UNIT) CAPS capsule Take 1 capsule (50,000 Units total) by mouth every 7 (seven) days. 12 capsule 3   No current facility-administered medications on file prior to visit.    There are no Patient Instructions on file for this visit. No follow-ups on file.   Kris Hartmann, NP

## 2022-03-12 ENCOUNTER — Telehealth: Payer: Self-pay

## 2022-03-12 ENCOUNTER — Other Ambulatory Visit (HOSPITAL_COMMUNITY): Payer: Self-pay

## 2022-03-12 NOTE — Telephone Encounter (Signed)
Pharmacy Patient Advocate Encounter  Prolia due 03/15/22.  Insurance verification completed.    The patient is insured through [employer] for: Prolia.  Pharmacy benefit copay: $433.85 (in coverage gap)   Prolia verification of benefits submitted.

## 2022-03-18 ENCOUNTER — Other Ambulatory Visit: Payer: Self-pay | Admitting: Cardiovascular Disease

## 2022-03-19 ENCOUNTER — Telehealth: Payer: Self-pay | Admitting: Family Medicine

## 2022-03-19 NOTE — Telephone Encounter (Signed)
Reasonable to get all 3 - would start with RSV and COVID, then proceed with shingles series. All can be done at local pharmacy.   Regarding acyclovir, I think ok to continue with shingles shot. Would have her double check with Dr Wallace Going who prescribes it.

## 2022-03-19 NOTE — Telephone Encounter (Signed)
Patient daughter called and stated does patient need RSV, Shingles, Covid shot, and what order does it need to be in and can patient stop theacyclovir (ZOVIRAX) 400 MG tablet  with the Shingle shot. Call back number [phone].

## 2022-03-20 NOTE — Telephone Encounter (Signed)
Spoke with pt's daughter, [name] (on dpr) relaying Dr. Synthia Innocent message. Verbalizes understanding and expresses her thanks.

## 2022-03-25 ENCOUNTER — Encounter: Payer: Self-pay | Admitting: Dermatology

## 2022-03-26 NOTE — Telephone Encounter (Signed)
Called and spoke to Daughter ( on [place]) she would like to wait until first of year.

## 2022-04-09 ENCOUNTER — Other Ambulatory Visit: Payer: Self-pay | Admitting: Family Medicine

## 2022-04-12 NOTE — Telephone Encounter (Signed)
[facility] Last filled: 01/11/22, #[id number] Last OV: 01/30/22, 4 mo [facility] f/u Next OV: 06/03/22, 4 mo f/u

## 2022-04-17 ENCOUNTER — Ambulatory Visit: Payer: Medicare Other | Admitting: Dermatology

## 2022-04-17 ENCOUNTER — Encounter: Payer: Self-pay | Admitting: Dermatology

## 2022-04-17 VITALS — BP 146/85 | HR 79

## 2022-04-17 DIAGNOSIS — D492 Neoplasm of unspecified behavior of bone, soft tissue, and skin: Secondary | ICD-10-CM

## 2022-04-17 DIAGNOSIS — L570 Actinic keratosis: Secondary | ICD-10-CM

## 2022-04-17 DIAGNOSIS — D0461 Carcinoma in situ of skin of right upper limb, including shoulder: Secondary | ICD-10-CM

## 2022-04-17 DIAGNOSIS — L821 Other seborrheic keratosis: Secondary | ICD-10-CM

## 2022-04-17 DIAGNOSIS — D099 Carcinoma in situ, unspecified: Secondary | ICD-10-CM

## 2022-04-17 HISTORY — DX: Carcinoma in situ, unspecified: D09.9

## 2022-04-17 NOTE — Patient Instructions (Addendum)
Wound Care Instructions  Cleanse wound gently with soap and water once a day then pat dry with clean gauze. Apply a thin coat of Petrolatum (petroleum jelly, "Vaseline") over the wound (unless you have an allergy to this). We recommend that you use a new, sterile tube of Vaseline. Do not pick or remove scabs. Do not remove the yellow or white "healing tissue" from the base of the wound.  Cover the wound with fresh, clean, nonstick gauze and secure with paper tape. You may use Band-Aids in place of gauze and tape if the wound is small enough, but would recommend trimming much of the tape off as there is often too much. Sometimes Band-Aids can irritate the skin.  You should call the office for your biopsy report after 1 week if you have not already been contacted.  If you experience any problems, such as abnormal amounts of bleeding, swelling, significant bruising, significant pain, or evidence of infection, please call the office immediately.  FOR ADULT SURGERY PATIENTS: If you need something for pain relief you may take 1 extra strength Tylenol (acetaminophen) AND 2 Ibuprofen (200mg each) together every 4 hours as needed for pain. (do not take these if you are allergic to them or if you have a reason you should not take them.) Typically, you may only need pain medication for 1 to 3 days.     Cryotherapy Aftercare  Wash gently with soap and water everyday.   Apply Vaseline and Band-Aid daily until healed.    Due to recent changes in healthcare laws, you may see results of your pathology and/or laboratory studies on MyChart before the doctors have had a chance to review them. We understand that in some cases there may be results that are confusing or concerning to you. Please understand that not all results are received at the same time and often the doctors may need to interpret multiple results in order to provide you with the best plan of care or course of treatment. Therefore, we ask that you  please give us 2 business days to thoroughly review all your results before contacting the office for clarification. Should we see a critical lab result, you will be contacted sooner.   If You Need Anything After Your Visit  If you have any questions or concerns for your doctor, please call our main line at [phone] and press option 4 to reach your doctor's medical assistant. If no one answers, please leave a voicemail as directed and we will return your call as soon as possible. Messages left after 4 pm will be answered the following business day.   You may also send us a message via MyChart. We typically respond to MyChart messages within 1-2 business days.  For prescription refills, please ask your pharmacy to contact our office. Our fax number is [phone].  If you have an urgent issue when the clinic is closed that cannot wait until the next business day, you can page your doctor at the number below.    Please note that while we do our best to be available for urgent issues outside of office hours, we are not available 24/7.   If you have an urgent issue and are unable to reach us, you may choose to seek medical care at your doctor's office, retail clinic, urgent care center, or emergency room.  If you have a medical emergency, please immediately call 911 or go to the emergency department.  Pager Numbers  - Dr. Kowalski: [phone]  -   Dr. Moye: [phone]  - Dr. Stewart: [phone]  In the event of inclement weather, please call our main line at [phone] for an update on the status of any delays or closures.  Dermatology Medication Tips: Please keep the boxes that topical medications come in in order to help keep track of the instructions about where and how to use these. Pharmacies typically print the medication instructions only on the boxes and not directly on the medication tubes.   If your medication is too expensive, please contact our office at  [phone] option 4 or send us a message through MyChart.   We are unable to tell what your co-pay for medications will be in advance as this is different depending on your insurance coverage. However, we may be able to find a substitute medication at lower cost or fill out paperwork to get insurance to cover a needed medication.   If a prior authorization is required to get your medication covered by your insurance company, please allow us 1-2 business days to complete this process.  Drug prices often vary depending on where the prescription is filled and some pharmacies may offer cheaper prices.  The website www.goodrx.com contains coupons for medications through different pharmacies. The prices here do not account for what the cost may be with help from insurance (it may be cheaper with your insurance), but the website can give you the price if you did not use any insurance.  - You can print the associated coupon and take it with your prescription to the pharmacy.  - You may also stop by our office during regular business hours and pick up a GoodRx coupon card.  - If you need your prescription sent electronically to a different pharmacy, notify our office through [facility] MyChart or by phone at [phone] option 4.     Si Usted Necesita Algo Despus de Su Visita  Tambin puede enviarnos un mensaje a travs de MyChart. Por lo general respondemos a los mensajes de MyChart en el transcurso de 1 a 2 das hbiles.  Para renovar recetas, por favor pida a su farmacia que se ponga en contacto con nuestra oficina. Nuestro nmero de fax es el [phone].  Si tiene un asunto urgente cuando la clnica est cerrada y que no puede esperar hasta el siguiente da hbil, puede llamar/localizar a su doctor(a) al nmero que aparece a continuacin.   Por favor, tenga en cuenta que aunque hacemos todo lo posible para estar disponibles para asuntos urgentes fuera del horario de oficina, no estamos  disponibles las 24 horas del da, los 7 das de la semana.   Si tiene un problema urgente y no puede comunicarse con nosotros, puede optar por buscar atencin mdica  en el consultorio de su doctor(a), en una clnica privada, en un centro de atencin urgente o en una sala de emergencias.  Si tiene una emergencia mdica, por favor llame inmediatamente al 911 o vaya a la sala de emergencias.  Nmeros de bper  - Dr. Kowalski: [phone]  - Dra. Moye: [phone]  - Dra. Stewart: [phone]  En caso de inclemencias del tiempo, por favor llame a nuestra lnea principal al [phone] para una actualizacin sobre el estado de cualquier retraso o cierre.  Consejos para la medicacin en dermatologa: Por favor, guarde las cajas en las que vienen los medicamentos de uso tpico para ayudarle a seguir las instrucciones sobre dnde y cmo usarlos. Las farmacias generalmente imprimen las instrucciones del medicamento slo en las cajas y   no directamente en los tubos del medicamento.   Si su medicamento es muy caro, por favor, pngase en contacto con nuestra oficina llamando al [phone] y presione la opcin 4 o envenos un mensaje a travs de MyChart.   No podemos decirle cul ser su copago por los medicamentos por adelantado ya que esto es diferente dependiendo de la cobertura de su seguro. Sin embargo, es posible que podamos encontrar un medicamento sustituto a menor costo o llenar un formulario para que el seguro cubra el medicamento que se considera necesario.   Si se requiere una autorizacin previa para que su compaa de seguros cubra su medicamento, por favor permtanos de 1 a 2 das hbiles para completar este proceso.  Los precios de los medicamentos varan con frecuencia dependiendo del lugar de dnde se surte la receta y alguna farmacias pueden ofrecer precios ms baratos.  El sitio web www.goodrx.com tiene cupones para medicamentos de diferentes farmacias. Los precios aqu no  tienen en cuenta lo que podra costar con la ayuda del seguro (puede ser ms barato con su seguro), pero el sitio web puede darle el precio si no utiliz ningn seguro.  - Puede imprimir el cupn correspondiente y llevarlo con su receta a la farmacia.  - Tambin puede pasar por nuestra oficina durante el horario de atencin regular y recoger una tarjeta de cupones de GoodRx.  - Si necesita que su receta se enve electrnicamente a una farmacia diferente, informe a nuestra oficina a travs de MyChart de [facility] o por telfono llamando al [phone] y presione la opcin 4.  

## 2022-04-17 NOTE — Progress Notes (Signed)
Follow-Up Visit   Subjective  [name] is a 89 y.o. female who presents for the following: Actinic Keratosis (4 month folllow up. Right temple, left cheek, right dorsal hand. Tx with LN2 at last visit) and Follow-up (Right dorsal hand proximal and distal. Both were SCC's-KA type. Proximal lesion appears to be growing back).  The patient has spots, moles and lesions to be evaluated, some may be new or changing and the patient has concerns that these could be cancer.  The following portions of the chart were reviewed this encounter and updated as appropriate:  Tobacco  Allergies  Meds  Problems  Med Hx  Surg Hx  Fam Hx      Review of Systems: No other skin or systemic complaints except as noted in HPI or Assessment and Plan.   Objective  Well appearing patient in no apparent distress; mood and affect are within normal limits.  A focused examination was performed including face and hands. Relevant physical exam findings are noted in the Assessment and Plan.  Right Dorsal Hand Proximal 0.8 cm firm pink papule with cutaneous horn     Left Dorsal Forearm (hypertrophic) x2. left cheek x2 (4) Erythematous thin papules/macules with gritty scale.    Assessment & Plan  Neoplasm of skin Right Dorsal Hand Proximal  Skin / nail biopsy Type of biopsy: tangential   Informed consent: discussed and consent obtained   Anesthesia: the lesion was anesthetized in a standard fashion   Anesthesia comment:  Area prepped with alcohol Anesthetic:  1% lidocaine w/ epinephrine 1-100,000 buffered w/ 8.4% NaHCO3 Instrument used: flexible razor blade   Hemostasis achieved with: pressure, aluminum chloride and electrodesiccation   Outcome: patient tolerated procedure well   Post-procedure details: wound care instructions given   Post-procedure details comment:  Ointment and small bandage applied  Specimen 1 - Surgical pathology Differential Diagnosis: R/O recurrent SCC, KA-type  Check  Margins: No  Previous Bx: [id number]  R/o Recurrent SCC, KA-type  ED&C has about an 85% cure rate and leaves a round wound the size of the skin cancer which is healed with ointment and a bandage over a few weeks time. It leaves a round white scar. No additional pathology is done. If the skin cancer were to come back, we would need to do a surgery to remove it.   Excision involves cutting out the spot with an area of normal looking skin around it followed by closing it with stitches. The cure rate is approximately 92-93%. It leaves a line scar, and you must take it easy for two weeks after surgery (no lifting over 10-15 lbs, avoid activity to get your heart rate and blood pressure up). There is a slightly higher risk of infection, bleeding or the wound opening up with this compared to the scrape and burn.   Mohs involves cutting out right around the spot and then checking under the microscope to be sure the whole skin cancer is out. The cure rate is about 98-99%. It is done at another office outside of [facility] ([place], [place], or [place]). Once the Mohs surgeon confirms the skin cancer is out, they will discuss the options to repair or heal the area. You must take it easy for about two weeks after surgery (no lifting over 10-15 lbs, avoid activity to get your heart rate and blood pressure up).    Patient prefers surgical excision in our office. Scheduled for 05/08/22 at 11am.   AK (actinic keratosis) (4) Left Dorsal  Forearm (hypertrophic) x2. left cheek x2  Hypertrophic   Actinic keratoses are precancerous spots that appear secondary to cumulative UV radiation exposure/sun exposure over time. They are chronic with expected duration over 1 year. A portion of actinic keratoses will progress to squamous cell carcinoma of the skin. It is not possible to reliably predict which spots will progress to skin cancer and so treatment is recommended to prevent development of skin  cancer.  Recommend daily broad spectrum sunscreen SPF 30+ to sun-exposed areas, reapply every 2 hours as needed.  Recommend staying in the shade or wearing long sleeves, sun glasses (UVA+UVB protection) and wide brim hats (4-inch brim around the entire circumference of the hat). Call for new or changing lesions.  Destruction of lesion - Left Dorsal Forearm (hypertrophic) x2. left cheek x2  Destruction method: cryotherapy   Informed consent: discussed and consent obtained   Lesion destroyed using liquid nitrogen: Yes   Region frozen until ice ball extended beyond lesion: Yes   Outcome: patient tolerated procedure well with no complications   Post-procedure details: wound care instructions given   Additional details:  Prior to procedure, discussed risks of blister formation, small wound, skin dyspigmentation, or rare scar following cryotherapy. Recommend Vaseline ointment to treated areas while healing.    Seborrheic Keratoses - Stuck-on, waxy, tan-brown papules and/or plaques  - Benign-appearing - Discussed benign etiology and prognosis. - Observe - Call for any changes  Return for Surgery As Scheduled .  I, Emelia Salisbury, CMA, am acting as scribe for Forest Gleason, MD.  Documentation: I have reviewed the above documentation for accuracy and completeness, and I agree with the above.  Forest Gleason, MD

## 2022-04-18 ENCOUNTER — Ambulatory Visit: Payer: Medicare Other | Attending: Cardiovascular Disease

## 2022-04-18 DIAGNOSIS — R5383 Other fatigue: Secondary | ICD-10-CM

## 2022-04-18 DIAGNOSIS — I4821 Permanent atrial fibrillation: Secondary | ICD-10-CM | POA: Diagnosis not present

## 2022-04-18 LAB — ECHOCARDIOGRAM COMPLETE
AR max vel: 1.56 cm2
AV Area VTI: 1.35 cm2
AV Area mean vel: 1.42 cm2
AV Mean grad: 4 mmHg
AV Peak grad: 7.3 mmHg
AV Vena cont: 0.6 cm
Ao pk vel: 1.36 m/s
Calc EF: 52.6 %
S' Lateral: 2.7 cm
Single Plane A2C EF: 52.9 %
Single Plane A4C EF: 53.5 %

## 2022-04-23 ENCOUNTER — Telehealth: Payer: Self-pay

## 2022-04-23 NOTE — Telephone Encounter (Signed)
-----  Message from Alfonso Patten, MD sent at 04/23/2022  2:31 PM EST ----- Skin , right dorsal hand proximal SQUAMOUS CELL CARCINOMA IN SITU, INVERTED --> scheduled for excision for recurrent SCC already  MAs please call. Thank you!

## 2022-04-23 NOTE — Telephone Encounter (Signed)
Discussed pathology results with daughter, [name]. [place] for surgical excision.

## 2022-04-24 ENCOUNTER — Other Ambulatory Visit: Payer: Self-pay | Admitting: Cardiovascular Disease

## 2022-04-29 ENCOUNTER — Encounter: Payer: Self-pay | Admitting: Dermatology

## 2022-04-30 NOTE — Telephone Encounter (Signed)
Please reverify ins for this year's benefits.  Thank you

## 2022-05-01 ENCOUNTER — Encounter: Payer: Self-pay | Admitting: Cardiovascular Disease

## 2022-05-01 ENCOUNTER — Ambulatory Visit: Payer: Medicare Other | Admitting: Cardiovascular Disease

## 2022-05-01 ENCOUNTER — Ambulatory Visit: Payer: Medicare Other | Attending: Cardiovascular Disease | Admitting: Cardiovascular Disease

## 2022-05-01 VITALS — BP 112/60 | HR 89 | Ht 62.0 in | Wt 148.0 lb

## 2022-05-01 DIAGNOSIS — I739 Peripheral vascular disease, unspecified: Secondary | ICD-10-CM

## 2022-05-01 DIAGNOSIS — I4821 Permanent atrial fibrillation: Secondary | ICD-10-CM

## 2022-05-01 DIAGNOSIS — E785 Hyperlipidemia, unspecified: Secondary | ICD-10-CM

## 2022-05-01 DIAGNOSIS — I10 Essential (primary) hypertension: Secondary | ICD-10-CM | POA: Diagnosis not present

## 2022-05-01 DIAGNOSIS — I5032 Chronic diastolic (congestive) heart failure: Secondary | ICD-10-CM

## 2022-05-01 NOTE — Progress Notes (Signed)
Cardiology Office Note   Date:  05/01/2022   ID:  INIKA BELLANGER, DOB 02-08-1930, MRN [id number]  PCP:  Ria Bush, MD  Cardiologist:   [name], MD   Chief Complaint  Patient presents with   Other    6 month f/u c/o sob. Meds reviewed verbally with pt.      History of Present Illness: [name] is a 89 y.o. female who is here today for follow-up visit regarding atrial fibrillation , mild nonobstructive coronary artery disease and chronic diastolic heart failure.  Other medical problems include chronic dyspnea and fatigue, essential hypertension, hyperlipidemia, rheumatoid arthritis and peripheral arterial disease. Right and left cardiac catheterization was done in April 2021.  It showed mild to moderate nonobstructive coronary artery disease.  Worst stenosis was 50% in the ostial right coronary artery which was not significant by fractional flow reserve.  Right heart catheterization at that time showed mildly elevated filling pressures, mild pulmonary hypertension and low normal cardiac output. She was hospitalized in July after a fall and fracture of the right wrist.  She had acute on chronic renal failure which improved after discontinuing furosemide.  She had lower extremity arterial angiogram done by Dr. Lucky Cowboy with endovascular intervention on tibial vessels bilaterally.  She had COVID-19 infection in June of 2022. She was hospitalized in July , 2022 with cholelithiasis.  She was found to have elevated LFTs and mild lactic acidosis.  She was septic with E. coli.  She was diagnosed with ascending cholangitis and was treated with antibiotics.    She had previous cardioversion but had recurrent atrial fibrillation in spite of treatment with amiodarone and thus she has been treated with rate control.  I increased the dose of Toprol last year up to 100 mg twice daily and discontinued amlodipine.  Since then, she felt better.  Her balance is not great and she has to use a  walker.  She uses a wheelchair frequently as well.   Past Medical History:  Diagnosis Date   Abdominal aortic atherosclerosis ([place]) 09/2015   by xray   Anxiety    BCC (basal cell carcinoma of skin) 05/2011   on nose   BCC (basal cell carcinoma of skin) 08/09/2020   upper chest, [facility] 12/27/20   Breast lesion    a. diagnosed with complex sclerosing lesion with calcifications of the right breast in 08/2016   Chest pain    a. nuclear stress test 03/2012: normal; b. 09/2018 MV: EF >65%, no isch/scar; c. 07/2019 Cath: LM nl, LAD 40ost, 59m LCX nl, RCA 50ost/p (iFR 0.98). EF 55-65% ->Med Rx.   DDD (degenerative disc disease), lumbosacral 12/06/1992   Gallstones 09/2015   incidentally by xray   GERD (gastroesophageal reflux disease) 05/1999   HERPES ZOSTER 04/13/2007   Qualifier: Diagnosis of  By: BMaxie BetterFNP, BRosalita Levan   Hiatal hernia    History of shingles    ophthalmic, takes acyclovir daily   Hyperlipidemia 12/2003   Hypertension 1990   Mitral regurgitation    a. echo 03/2012: EF 60-65%, no RWMA, mild to mod AI/MR, mod TR, PASP 37 mmHg; b. 05/2019 Echo: EF 60-65%, mod MR.   Osteoarthritis 08/21/1989   Osteoporosis with fracture 11/1997   with compression fracture T12   PAD (peripheral artery disease) [place]    a. 01/2020 PTA of R Peroneal and R PT; b. 09/2021 ABI/TBI: R 1.04/09.2, L 1.36/0.17.   Permanent atrial fibrillation ([place] 03/16/2012   a. CHADS2VASc =>  5 (HTN, age x 2, vascular disease, sex category)-->Eliquis 5 BID;  b. Recurrent AF in 06/2016;  c. 01/2017 s/p DCCV;  d. 02/2017 recurrent AFib->Amio added->DCCV; e. 03/2017 recurrent AFib, s/p reload of amio and DCCV; f. 12/2020 DCCV-->recurrent Afib, Amio d/c-->rate control.   POSTHERPETIC NEURALGIA 04/20/2007   Qualifier: Diagnosis of  By: [name], [name]    Rheumatoid arthritis ([place])    RLS (restless legs syndrome)    SCC (squamous cell carcinoma) 05/01/2021   right posterior flank inferior to axilla - well  differentiated squamous cell carcinoma with superificial infiltration, base involved. EDC 06/06/2021   Squamous cell carcinoma in situ 04/17/2022   Right dorsal hand, proximal. SCCis, inverted. Excision pending.   Squamous cell carcinoma of skin 12/19/2021   Proximal right dorsal hand. KA type. [facility] 12/19/2021   Squamous cell carcinoma of skin 12/19/2021   Distal right dorsal hand. KA type. [facility] 12/19/2021    Past Surgical History:  Procedure Laterality Date   ABDOMINAL HYSTERECTOMY  Age 8 - 70   S/P TAH   abdominal ultrasound  04/23/2004   Gallstones   BREAST EXCISIONAL BIOPSY Right 2018   neg/benign complex sclerosing lesion   BREAST LUMPECTOMY WITH RADIOACTIVE SEED LOCALIZATION Right 10/2016   breast lumpectomy with radioactive seed localization and margin assessment for complex sclerosing lesion Renelda Loma)   BREAST LUMPECTOMY WITH RADIOACTIVE SEED LOCALIZATION Right 11/05/2016   Procedure: RIGHT BREAST LUMPECTOMY WITH RADIOACTIVE SEED LOCALIZATION;  Surgeon: Fanny Skates, MD;  Location: [place];  Service: General;  Laterality: Right;   CARDIAC CATHETERIZATION     CARDIOVERSION N/A 01/31/2017   Procedure: CARDIOVERSION;  Surgeon: Wellington Hampshire, MD;  Location: [facility];  Service: Cardiovascular;  Laterality: N/A;   CARDIOVERSION N/A 03/03/2017   Procedure: CARDIOVERSION;  Surgeon: Wellington Hampshire, MD;  Location: [facility];  Service: Cardiovascular;  Laterality: N/A;   CARDIOVERSION N/A 04/02/2017   Procedure: CARDIOVERSION;  Surgeon: Wellington Hampshire, MD;  Location: [facility];  Service: Cardiovascular;  Laterality: N/A;   CARDIOVERSION N/A 01/16/2018   Procedure: CARDIOVERSION;  Surgeon: Wellington Hampshire, MD;  Location: [facility];  Service: Cardiovascular;  Laterality: N/A;   CARDIOVERSION N/A 12/25/2020   Procedure: CARDIOVERSION;  Surgeon: Wellington Hampshire, MD;  Location: [facility];  Service: Cardiovascular;  Laterality: N/A;   CATARACT EXTRACTION  05/2010    left eye   CERVICAL DISCECTOMY  1992   Fusion (Dr. Joya Salm)   ERCP N/A 10/30/2020   Procedure: ENDOSCOPIC RETROGRADE CHOLANGIOPANCREATOGRAPHY (ERCP);  Surgeon: Jackquline Denmark, MD;  Location[facility];  Service: Endoscopy;  Laterality: N/A;   ESI Bilateral 01/2016   S1 transforaminal ESI x3   ESOPHAGOGASTRODUODENOSCOPY  01/01/2002   with ulcer, bx. neg; + stricture gastric ulcer with hemorrhage   ESOPHAGOGASTRODUODENOSCOPY  04/23/2004   Esophageal stricture -- dilated   INTRAVASCULAR PRESSURE WIRE/FFR STUDY N/A 07/12/2019   Procedure: INTRAVASCULAR PRESSURE WIRE/FFR STUDY;  Surgeon: Wellington Hampshire, MD;  Location: [place] [facility];  Service: Cardiovascular;  Laterality: N/A;   LOWER EXTREMITY ANGIOGRAPHY Left 02/01/2019   LOWER EXTREMITY ANGIOGRAPHY;  Surgeon: Algernon Huxley, MD   LOWER EXTREMITY ANGIOGRAPHY Right 03/25/2019   Procedure: LOWER EXTREMITY ANGIOGRAPHY;  Surgeon: Algernon Huxley, MD;  Location: [place] [facility];  Service: Cardiovascular;  Laterality: Right;   LOWER EXTREMITY ANGIOGRAPHY Right 08/02/2019   Procedure: LOWER EXTREMITY ANGIOGRAPHY;  Surgeon: Algernon Huxley, MD;  Location: [place] [facility];  Service: Cardiovascular;  Laterality: Right;  nuclear stress test  03/2012   no ischemia   PERIPHERAL VASCULAR BALLOON ANGIOPLASTY Left 01/2019   Percutaneous transluminal angioplasty of left anterior and posterior tibial artery with 2.5 mm diameter by 22 cm length angioplasty balloon ([place])   REMOVAL OF STONES  10/30/2020   Procedure: REMOVAL OF STONES;  Surgeon: Jackquline Denmark, MD;  Location: [facility]COPY;  Service: Endoscopy;;   RIGHT/LEFT HEART CATH AND CORONARY ANGIOGRAPHY Bilateral 07/12/2019   Procedure: RIGHT/LEFT HEART CATH AND CORONARY ANGIOGRAPHY;  Surgeon: Wellington Hampshire, MD;  Location: [place] [facility];  Service: Cardiovascular;  Laterality: Bilateral;   SKIN CANCER EXCISION  05/20/2011   BCC from nose   SPHINCTEROTOMY  10/30/2020   Procedure:  SPHINCTEROTOMY;  Surgeon: Jackquline Denmark, MD;  Location: [facility]COPY;  Service: Endoscopy;;   US ECHOCARDIOGRAPHY  03/2012   in flutter, EF 60%, mild-mod aortic, mitral, tricuspid regurg, mildly dilated LA     Current Outpatient Medications  Medication Sig Dispense Refill   acetaminophen (TYLENOL) 500 MG tablet Take 500 mg by mouth every 6 (six) hours as needed for mild pain.     acyclovir (ZOVIRAX) 400 MG tablet Take 400 mg by mouth 2 (two) times daily.     Alpha-Lipoic Acid 600 MG TABS Take 600 mg by mouth daily.     amLODipine (NORVASC) 2.5 MG tablet TAKE ONE TABLET BY MOUTH ONCE DAILY 90 tablet 0   atorvastatin (LIPITOR) 20 MG tablet Take 1 tablet (20 mg total) by mouth daily. 90 tablet 3   Docusate Calcium (STOOL SOFTENER PO) Take 1 capsule by mouth daily.     DULoxetine (CYMBALTA) 60 MG capsule TAKE 1 CAPSULE BY MOUTH ONCE DAILY 90 capsule 2   ELIQUIS 5 MG TABS tablet TAKE 1 TABLET BY MOUTH TWICE DAILY 180 tablet 1   furosemide (LASIX) 20 MG tablet Take 1 tablet (20 mg total) by mouth daily. 90 tablet 0   gabapentin (NEURONTIN) 400 MG capsule TAKE 1 CAPSULE BY MOUTH AT BEDTIME AND SECOND DOSE DURING THE DAY AS NEEDED 120 capsule 3   hydrocortisone (ANUSOL-HC) 2.5 % rectal cream Place 1 application rectally daily as needed for hemorrhoids or anal itching. For hemmorhoid 30 g 0   lisinopril (ZESTRIL) 20 MG tablet TAKE ONE TABLET BY MOUTH ONCE DAILY 90 tablet 0   memantine (NAMENDA) 10 MG tablet Take 1 tablet (10 mg total) by mouth 2 (two) times daily. 180 tablet 3   metoprolol succinate (TOPROL-XL) 100 MG 24 hr tablet TAKE 1 TABLET BY MOUTH TWICE DAILY 180 tablet 0   pantoprazole (PROTONIX) 40 MG tablet Take 1 tablet (40 mg total) by mouth daily. 90 tablet 3   polyethylene glycol (MIRALAX / GLYCOLAX) 17 g packet Take 17 g by mouth daily as needed for mild constipation. 14 each 0   prednisoLONE acetate (PRED FORTE) 1 % ophthalmic suspension Place 1 drop into the left eye daily.       rOPINIRole (REQUIP) 1 MG tablet Take 2.5 tablets (2.5 mg total) by mouth at bedtime. 225 tablet 3   tiZANidine (ZANAFLEX) 2 MG tablet Take 2 mg by mouth daily.     traMADol (ULTRAM) 50 MG tablet Take 1-2 tablets (50-100 mg total) by mouth at bedtime as needed. 30 tablet 0   Vitamin D, Ergocalciferol, (DRISDOL) 1.25 MG (50000 UNIT) CAPS capsule Take 1 capsule (50,000 Units total) by mouth every 7 (seven) days. 12 capsule 3   No current facility-administered medications for this visit.    Allergies:  Penicillins    Social History:  The patient  reports that she quit smoking about 46 years ago. Her smoking use included cigarettes. She has a 0.25 pack-year smoking history. She has never used smokeless tobacco. She reports that she does not drink alcohol and does not use drugs.   Family History:  The patient's family history includes Cancer in her brother; Diabetes in her brother, brother, brother, and sister; Emphysema in her mother; Heart disease in her brother and father; Hypertension in her brother; Stroke in her brother, father, and sister.    ROS:  Please see the history of present illness.   Otherwise, review of systems are positive for none.   All other systems are reviewed and negative.    PHYSICAL EXAM: VS:  BP 112/60 (BP Location: Left Arm, Patient Position: Sitting, Cuff Size: Normal)   Pulse 89   Ht '5\' 2"'$  (1.575 m)   Wt 148 lb (67.1 kg)   SpO2 96%   BMI 27.07 kg/m  , BMI Body mass index is 27.07 kg/m. GEN: Well nourished, well developed, in no acute distress  HEENT: normal  Neck: no JVD, carotid bruits, or masses Cardiac: Irregularly irregular,no murmurs, rubs, or gallops, no lower extremity edema.  chronic stasis dermatitis. Respiratory:  clear to auscultation bilaterally, normal work of breathing GI: soft, nontender, nondistended, + BS MS: no deformity or atrophy  Skin: warm and dry, no rash Neuro:  Strength and sensation are intact Psych: euthymic mood, full  affect  EKG:  EKG is ordered today. The ekg ordered today demonstrates atrial fibrillation with ventricular rate of 89 bpm.  Poor R wave progression in the anterior leads.   Recent Labs: 05/30/2021: Pro B Natriuretic peptide (BNP) 305.0 09/12/2021: ALT 10 02/15/2022: BUN 14; Creatinine, Ser 0.88; Hemoglobin 14.3; Platelets 217; Potassium 3.7; Sodium 141; TSH 2.271    Lipid Panel    Component Value Date/Time   CHOL 148 09/12/2021 1612   TRIG 124.0 09/12/2021 1612   HDL 52.40 09/12/2021 1612   CHOLHDL 3 09/12/2021 1612   VLDL 24.8 09/12/2021 1612   LDLCALC 70 09/12/2021 1612   LDLDIRECT 173.1 02/19/2010 0908      Wt Readings from Last 3 Encounters:  05/01/22 148 lb (67.1 kg)  02/20/22 146 lb 3.2 oz (66.3 kg)  02/15/22 147 lb (66.7 kg)           No data to display            ASSESSMENT AND PLAN:  1.  Permanent atrial fibrillation: Ventricular rate is now well-controlled on Toprol 100 mg twice daily.  His creatinine is less than 1 and thus we will continue with Eliquis 5 mg twice daily.  2. Chronic diastolic heart failure: She appears to be euvolemic on small dose furosemide 20 mg daily.  3. Essential hypertension: Amlodipine is still on her medication list but I instructed her not to take this as it was discontinued last year.  4. Hyperlipidemia: Currently on atorvastatin 20 mg once daily with most recent LDL of 50.  5.  Peripheral arterial disease: Status post tibial vessel intervention managed by Dr. Lucky Cowboy.   Disposition:   FU in 6 months.  Signed,  [name], MD  05/01/2022 4:21 PM    [place]

## 2022-05-01 NOTE — Patient Instructions (Signed)
Medication Instructions:  STOP the Amlodipine  *If you need a refill on your cardiac medications before your next appointment, please call your pharmacy*   Lab Work: None ordered If you have labs (blood work) drawn today and your tests are completely normal, you will receive your results only by: MyChart Message (if you have MyChart) OR A paper copy in the mail If you have any lab test that is abnormal or we need to change your treatment, we will call you to review the results.   Testing/Procedures: None ordered   Follow-Up: At [facility], you and your health needs are our priority.  As part of our continuing mission to provide you with exceptional heart care, we have created designated Provider Care Teams.  These Care Teams include your primary Cardiologist (physician) and Advanced Practice Providers (APPs -  Physician Assistants and Nurse Practitioners) who all work together to provide you with the care you need, when you need it.  We recommend signing up for the patient portal called "MyChart".  Sign up information is provided on this After Visit Summary.  MyChart is used to connect with patients for Virtual Visits (Telemedicine).  Patients are able to view lab/test results, encounter notes, upcoming appointments, etc.  Non-urgent messages can be sent to your provider as well.   To learn more about what you can do with MyChart, go to [email or web address].    Your next appointment:   6 month(s)  Provider:   You may see Muhammad Arida, MD or one of the following Advanced Practice Providers on your designated Care Team:   Christopher Berge, NP Ryan Dunn, PA-C Cadence Furth, PA-C Sheri Hammock, NP     

## 2022-05-03 ENCOUNTER — Other Ambulatory Visit (HOSPITAL_COMMUNITY): Payer: Self-pay

## 2022-05-03 DIAGNOSIS — T161XXA Foreign body in right ear, initial encounter: Secondary | ICD-10-CM | POA: Diagnosis not present

## 2022-05-03 DIAGNOSIS — H903 Sensorineural hearing loss, bilateral: Secondary | ICD-10-CM | POA: Diagnosis not present

## 2022-05-03 DIAGNOSIS — H6063 Unspecified chronic otitis externa, bilateral: Secondary | ICD-10-CM | POA: Diagnosis not present

## 2022-05-03 DIAGNOSIS — H6123 Impacted cerumen, bilateral: Secondary | ICD-10-CM | POA: Diagnosis not present

## 2022-05-03 NOTE — Telephone Encounter (Signed)
Pharmacy Patient Advocate Encounter  Insurance verification completed.    The patient is insured through [email or web address]   Ran test claims for: Prolia '60mg'$ .  Pharmacy benefit copay: $300.00

## 2022-05-03 NOTE — Telephone Encounter (Signed)
Prolia VOB initiated via [email or web address]

## 2022-05-08 ENCOUNTER — Telehealth: Payer: Self-pay

## 2022-05-08 ENCOUNTER — Encounter: Payer: Self-pay | Admitting: Dermatology

## 2022-05-08 ENCOUNTER — Ambulatory Visit (INDEPENDENT_AMBULATORY_CARE_PROVIDER_SITE_OTHER): Payer: Medicare Other | Admitting: Dermatology

## 2022-05-08 VITALS — BP 152/80 | HR 81

## 2022-05-08 DIAGNOSIS — D0461 Carcinoma in situ of skin of right upper limb, including shoulder: Secondary | ICD-10-CM

## 2022-05-08 DIAGNOSIS — D099 Carcinoma in situ, unspecified: Secondary | ICD-10-CM

## 2022-05-08 DIAGNOSIS — L988 Other specified disorders of the skin and subcutaneous tissue: Secondary | ICD-10-CM | POA: Diagnosis not present

## 2022-05-08 MED ORDER — DOXYCYCLINE MONOHYDRATE 100 MG PO CAPS: 100.0000 mg | ORAL_CAPSULE | Freq: Two times a day (BID) | ORAL | 0 refills | Status: DC

## 2022-05-08 MED ORDER — MUPIROCIN 2 % EX OINT: 1.0000 | TOPICAL_OINTMENT | Freq: Every day | CUTANEOUS | 0 refills | Status: DC

## 2022-05-08 NOTE — Telephone Encounter (Signed)
Called to check on patient following today's surgery. Patient's daughter, [name], said patient was having some mild discomfort and was getting ready to take some tylenol. I'll call and check on her again tomorrow but told her to please call us if they need anything sooner.  Lurlean Horns., RMA

## 2022-05-08 NOTE — Patient Instructions (Addendum)
Doxycycline should be taken with food to prevent nausea. Do not lay down for 30 minutes after taking. Be cautious with sun exposure and use good sun protection while on this medication. Pregnant women should not take this medication.    Wound Care Instructions for After Surgery  On the day following your surgery, you should begin doing daily dressing changes until your sutures are removed: Remove the bandage. Cleanse the wound gently with soap and water.  Make sure you then dry the skin surrounding the wound completely or the tape will not stick to the skin. Do not use cotton balls on the wound. After the wound is clean and dry, apply the ointment (either prescription antibiotic prescribed by your doctor or plain Vaseline if nothing was prescribed) gently with a Q-tip. If you are using a bandaid to cover: Apply a bandaid large enough to cover the entire wound. If you do not have a bandaid large enough to cover the wound OR if you are sensitive to bandaid adhesive: Cut a non-stick pad (such as Telfa) to fit the size of the wound.  Cover the wound with the non-stick pad. If the wound is draining, you may want to add a small amount of gauze on top of the non-stick pad for a little added compression to the area. Use tape to seal the area completely.  For the next 1-2 weeks: Be sure to keep the wound moist with ointment 24/7 to ensure best healing. If you are unable to cover the wound with a bandage to hold the ointment in place, you may need to reapply the ointment several times a day. Do not bend over or lift heavy items to reduce the chance of elevated blood pressure to the wound. Do not participate in particularly strenuous activities.  Below is a list of dressing supplies you might need.  Cotton-tipped applicators - Q-tips Gauze pads (2x2 and/or 4x4) - All-Purpose Sponges New and clean tube of petroleum jelly (Vaseline) OR prescription antibiotic ointment if prescribed Either a bandaid  large enough to cover the entire wound OR non-stick dressing material (Telfa) and Tape (Paper or Hypafix)  FOR ADULT SURGERY PATIENTS: If you need something for pain relief, you may take 1 extra strength Tylenol (acetaminophen) and 2 ibuprofen (200 mg) together every 4 hours as needed. (Do not take these medications if you are allergic to them or if you know you cannot take them for any other reason). Typically you may only need pain medication for 1-3 days.   Comments on the Post-Operative Period Slight swelling and redness often appear around the wound. This is normal and will disappear within several days following the surgery. The healing wound will drain a brownish-red-yellow discharge during healing. This is a normal phase of wound healing. As the wound begins to heal, the drainage may increase in amount. Again, this drainage is normal. Notify us if the drainage becomes persistently bloody, excessively swollen, or intensely painful or develops a foul odor or red streaks.  The healing wound will also typically be itchy. This is normal. If you have severe or persistent pain, Notify us if the discomfort is severe or persistent. Avoid alcoholic beverages when taking pain medicine.  In Case of Wound Hemorrhage A wound hemorrhage is when the bandage suddenly becomes soaked with bright red blood and flows profusely. If this happens, sit down or lie down with your head elevated. If the wound has a dressing on it, do not remove the dressing. Apply pressure to the  existing gauze. If the wound is not covered, use a gauze pad to apply pressure and continue applying the pressure for 20 minutes without peeking. DO NOT COVER THE WOUND WITH A LARGE TOWEL OR [place] CLOTH. Release your hand from the wound site but do not remove the dressing. If the bleeding has stopped, gently clean around the wound. Leave the dressing in place for 24 hours if possible. This wait time allows the blood vessels to close off so that you  do not spark a new round of bleeding by disrupting the newly clotted blood vessels with an immediate dressing change. If the bleeding does not subside, continue to hold pressure for 40 minutes. If bleeding continues, page your physician, contact an After Hours clinic or go to the Emergency Room.  Due to recent changes in healthcare laws, you may see results of your pathology and/or laboratory studies on MyChart before the doctors have had a chance to review them. We understand that in some cases there may be results that are confusing or concerning to you. Please understand that not all results are received at the same time and often the doctors may need to interpret multiple results in order to provide you with the best plan of care or course of treatment. Therefore, we ask that you please give [place] 2 business days to thoroughly review all your results before contacting the office for clarification. Should we see a critical lab result, you will be contacted sooner.   If You Need Anything After Your Visit  If you have any questions or concerns for your doctor, please call our main line at [phone] and press option 4 to reach your doctor's medical assistant. If no one answers, please leave a voicemail as directed and we will return your call as soon as possible. Messages left after 4 pm will be answered the following business day.   You may also send [place] a message via [place]. We typically respond to MyChart messages within 1-2 business days.  For prescription refills, please ask your pharmacy to contact our office. Our fax number is [phone].  If you have an urgent issue when the clinic is closed that cannot wait until the next business day, you can page your doctor at the number below.    Please note that while we do our best to be available for urgent issues outside of office hours, we are not available 24/7.   If you have an urgent issue and are unable to reach [place], you may choose to seek  medical care at your doctor's office, retail clinic, urgent care center, or emergency room.  If you have a medical emergency, please immediately call 911 or go to the emergency department.  Pager Numbers  - Dr. Nehemiah Massed: [phone]  - Dr. Laurence Ferrari: [phone]  - Dr. Nicole Kindred: [phone]  In the event of inclement weather, please call our main line at [phone] for an update on the status of any delays or closures.  Dermatology Medication Tips: Please keep the boxes that topical medications come in in order to help keep track of the instructions about where and how to use these. Pharmacies typically print the medication instructions only on the boxes and not directly on the medication tubes.   If your medication is too expensive, please contact our office at [phone] option 4 or send [place] a message through [place].   We are unable to tell what your co-pay for medications will be in advance as this is different depending on  your insurance coverage. However, we may be able to find a substitute medication at lower cost or fill out paperwork to get insurance to cover a needed medication.   If a prior authorization is required to get your medication covered by your insurance company, please allow [place] 1-2 business days to complete this process.  Drug prices often vary depending on where the prescription is filled and some pharmacies may offer cheaper prices.  The website www.goodrx.com contains coupons for medications through different pharmacies. The prices here do not account for what the cost may be with help from insurance (it may be cheaper with your insurance), but the website can give you the price if you did not use any insurance.  - You can print the associated coupon and take it with your prescription to the pharmacy.  - You may also stop by our office during regular business hours and pick up a GoodRx coupon card.  - If you need your prescription sent electronically to a  different pharmacy, notify our office through [facility] or by phone at [phone] option 4.     Si Usted Necesita Algo Despus de Su Visita  Tambin puede enviarnos un mensaje a travs de Pharmacist, community. Por lo general respondemos a los mensajes de MyChart en el transcurso de 1 a 2 das hbiles.  Para renovar recetas, por favor pida a su farmacia que se ponga en contacto con nuestra oficina. Harland Dingwall de fax es [place] [phone].  Si tiene un asunto urgente cuando la clnica est cerrada y que no puede esperar hasta el siguiente da hbil, puede llamar/localizar a su doctor(a) al nmero que aparece a continuacin.   Por favor, tenga en cuenta que aunque hacemos todo lo posible para estar disponibles para asuntos urgentes fuera del horario de [place], no estamos disponibles las 24 horas del da, los 7 das de la [place].   Si tiene un problema urgente y no puede comunicarse con nosotros, puede optar por buscar atencin mdica  en el consultorio de su doctor(a), en una clnica privada, en un centro de atencin urgente o en una sala de emergencias.  Si tiene Engineering geologist, por favor llame inmediatamente al 911 o vaya a la sala de emergencias.  Nmeros de bper  - Dr. Nehemiah Massed: [phone]  - Dra. Moye: [phone]  - Dra. Nicole Kindred: [phone]  En caso de inclemencias del [place], por favor llame a Johnsie Kindred principal al [phone] para una actualizacin sobre el [place] de cualquier retraso o cierre.  Consejos para la medicacin en dermatologa: Por favor, guarde las cajas en las que vienen los medicamentos de uso tpico para ayudarle a seguir las instrucciones sobre dnde y cmo usarlos. Las farmacias generalmente imprimen las instrucciones del medicamento slo en las cajas y no directamente en los tubos del [place].   Si su medicamento es muy caro, por favor, pngase en contacto con Zigmund Daniel llamando al [phone] y presione la opcin 4 o envenos un  mensaje a travs de Pharmacist, community.   No podemos decirle cul ser su copago por los medicamentos por adelantado ya que esto es diferente dependiendo de la cobertura de su seguro. Sin embargo, es posible que podamos encontrar un medicamento sustituto a Electrical engineer un formulario para que el seguro cubra el medicamento que se considera necesario.   Si se requiere una autorizacin previa para que su compaa de seguros [place] su medicamento, por favor permtanos de 1 a 2 das hbiles para completar este proceso.  Los precios de los medicamentos varan con frecuencia dependiendo del Environmental consultant de dnde se surte la receta y alguna farmacias pueden ofrecer precios ms baratos.  El sitio web www.goodrx.com tiene cupones para medicamentos de Airline pilot. Los precios aqu no tienen en cuenta lo que podra costar con la ayuda del seguro (puede ser ms barato con su seguro), pero el sitio web puede darle el precio si no utiliz Research scientist (physical sciences).  - Puede imprimir el cupn correspondiente y llevarlo con su receta a la farmacia.  - Tambin puede pasar por nuestra oficina durante el horario de atencin regular y Charity fundraiser una tarjeta de cupones de GoodRx.  - Si necesita que su receta se enve electrnicamente a una farmacia diferente, informe a nuestra oficina a travs de MyChart de [place] o por telfono llamando al [phone] y presione la opcin 4.

## 2022-05-08 NOTE — Progress Notes (Signed)
Follow-Up Visit   Subjective  [name] is a 89 y.o. female who presents for the following: Procedure (Patient here today for excision of bx proven SCCis at right dorsal hand proximal.).  Patient accompanied by daughter.   The following portions of the chart were reviewed this encounter and updated as appropriate:   Tobacco  Allergies  Meds  Problems  Med Hx  Surg Hx  Fam Hx      Review of Systems:  No other skin or systemic complaints except as noted in HPI or Assessment and Plan.  Objective  Well appearing patient in no apparent distress; mood and affect are within normal limits.  A focused examination was performed including right hand. Relevant physical exam findings are noted in the Assessment and Plan.  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  Skin excision  Lesion length (cm):  1.2 Margin per side (cm):  0.5 Total excision diameter (cm):  2.2 Informed consent: discussed and consent obtained   Timeout: patient name, date of birth, surgical site, and procedure verified   Procedure prep:  Patient was prepped and draped in usual sterile fashion Prep type:  Chlorhexidine Anesthesia: the lesion was anesthetized in a standard fashion   Anesthetic:  1% lidocaine w/ epinephrine 1-100,000 buffered w/ 8.4% NaHCO3 (6 cc lido w/epi, 3 cc bupivicaine) Instrument used comment:  15c Hemostasis achieved with: pressure and electrodesiccation    doxycycline (MONODOX) 100 MG capsule Take 1 capsule (100 mg total) by mouth 2 (two) times daily. Take with food  mupirocin ointment (BACTROBAN) 2 % Apply 1 Application topically daily.  Skin repair Complexity:  Complex Final length (cm):  4.8 Informed consent: discussed and consent obtained   Timeout: patient name, date of birth, surgical site, and procedure verified   Procedure prep:  Patient was prepped and draped in usual sterile  fashion Prep type:  Chlorhexidine Anesthesia: the lesion was anesthetized in a standard fashion   Anesthetic:  1% lidocaine w/ epinephrine 1-100,000 local infiltration Reason for type of repair: reduce tension to allow closure, reduce the risk of dehiscence, infection, and necrosis, allow closure of the large defect, allow side-to-side closure without requiring a flap or graft, compensate for the inelasticity of skin in this area, compensate for the surrounding damaged skin and enhance both functionality and cosmetic results   Undermining: edges undermined   Subcutaneous layers (deep stitches):  Suture size:  3-0 Suture type: Vicryl (polyglactin 910)   Stitches:  Buried vertical mattress Fine/surface layer approximation (top stitches):  Suture size:  4-0 Suture type: Prolene (polypropylene)   Hemostasis achieved with: suture, pressure and electrodesiccation Outcome: patient tolerated procedure well with no complications   Post-procedure details: wound care instructions given   Additional details:  Fascial plication sutures placed to help close the large defect Due to significant thinness of the dermis on the dorsal hand, the central portion of the wound would not support subcutaneous sutures despite minimal tension remaining at this area. Rather than leave the center open, simple interrupted sutures were placed through this area with the understanding that this area may open back up and need to be healed by secondary intention when we remove sutures in 2 weeks. Also discussed the possibility of skin tearing and the wound opening at this area. They will let me know if she has any issues.   Mupirocin and a pressure dressing applied.   Specimen 1 -  Surgical pathology Differential Diagnosis: Bx proven SCCis  Check Margins: yes Healing biopsy site [id number]  Recurrent after ED&C of SCC Start Doxy 100 bid x 7 days Mupirocin ointment daily   Return in about 2 weeks (around 05/22/2022) for  Suture Removal.  Graciella Belton, RMA, am acting as scribe for Forest Gleason, MD .  Documentation: I have reviewed the above documentation for accuracy and completeness, and I agree with the above.  Forest Gleason, MD

## 2022-05-09 NOTE — Telephone Encounter (Signed)
I spoke to patient's daughter, [name].  She will call me back next week to sch Prolia inj.

## 2022-05-09 NOTE — Telephone Encounter (Signed)
Called patient's daughter to see how she was doing today but no answer. LVM on patient's daughters phone to let us know if they need anything or if office is closed they can page Dr. Laurence Ferrari. Lurlean Horns., RMA

## 2022-05-15 ENCOUNTER — Other Ambulatory Visit: Payer: Self-pay | Admitting: Cardiovascular Disease

## 2022-05-15 ENCOUNTER — Telehealth: Payer: Self-pay

## 2022-05-15 DIAGNOSIS — H903 Sensorineural hearing loss, bilateral: Secondary | ICD-10-CM | POA: Diagnosis not present

## 2022-05-15 DIAGNOSIS — H6062 Unspecified chronic otitis externa, left ear: Secondary | ICD-10-CM | POA: Diagnosis not present

## 2022-05-15 NOTE — Telephone Encounter (Signed)
-----   Message from [place], MD sent at 05/15/2022  4:39 PM EST ----- Skin (M), right dorsal hand proximal NO RESIDUAL SQUAMOUS CELL CARCINOMA IN SITU, MARGINS FREE --> Entire lesion appears to be out. No additional treatment needed at this time. Please call our office [phone] with any questions.    MAs please call. Thank you!

## 2022-05-15 NOTE — Telephone Encounter (Signed)
Patient and patient's daughter advised pathology showed margins free. Lurlean Horns., RMA

## 2022-05-17 ENCOUNTER — Other Ambulatory Visit (HOSPITAL_COMMUNITY): Payer: Self-pay

## 2022-05-17 NOTE — Telephone Encounter (Signed)
Pt ready for scheduling on or after 05/17/22  Out-of-pocket cost due at time of visit: $332  Primary: [facility] Medicare Prolia co-insurance: 20% (approximately $302) Admin fee co-insurance: $30   Deductible:   Secondary:  Prolia co-insurance:  Admin fee co-insurance:   Deductible:   Prior Auth: Approved PA# [id number] Valid: 05/17/2022-05/18/2023  ** This summary of benefits is an estimation of the patient's out-of-pocket cost. Exact cost may vary based on individual plan coverage.

## 2022-05-21 NOTE — Telephone Encounter (Signed)
Noted. Will discuss at that time.

## 2022-05-21 NOTE — Telephone Encounter (Signed)
I spoke to patient's daughter, [name], about scheduling Prolia inj.  She wants to wait until Ms Virrueta's appt on 06/25/22 to speak to Dr Darnell Level to see if this inj is still appropriate due to patient's age and decreased mobility.

## 2022-05-22 ENCOUNTER — Ambulatory Visit (INDEPENDENT_AMBULATORY_CARE_PROVIDER_SITE_OTHER): Payer: Medicare Other | Admitting: Dermatology

## 2022-05-22 DIAGNOSIS — Z4802 Encounter for removal of sutures: Secondary | ICD-10-CM

## 2022-05-22 NOTE — Patient Instructions (Addendum)
Wash gently with hibiclens. Use hydrocolloid bandages. Can leave on up to 3 days.    Due to recent changes in healthcare laws, you may see results of your pathology and/or laboratory studies on MyChart before the doctors have had a chance to review them. We understand that in some cases there may be results that are confusing or concerning to you. Please understand that not all results are received at the same time and often the doctors may need to interpret multiple results in order to provide you with the best plan of care or course of treatment. Therefore, we ask that you please give [place] 2 business days to thoroughly review all your results before contacting the office for clarification. Should we see a critical lab result, you will be contacted sooner.   If You Need Anything After Your Visit  If you have any questions or concerns for your doctor, please call our main line at [phone] and press option 4 to reach your doctor's medical assistant. If no one answers, please leave a voicemail as directed and we will return your call as soon as possible. Messages left after 4 pm will be answered the following business day.   You may also send [place] a message via [place]. We typically respond to MyChart messages within 1-2 business days.  For prescription refills, please ask your pharmacy to contact our office. Our fax number is [phone].  If you have an urgent issue when the clinic is closed that cannot wait until the next business day, you can page your doctor at the number below.    Please note that while we do our best to be available for urgent issues outside of office hours, we are not available 24/7.   If you have an urgent issue and are unable to reach [place], you may choose to seek medical care at your doctor's office, retail clinic, urgent care center, or emergency room.  If you have a medical emergency, please immediately call 911 or go to the emergency department.  Pager Numbers  -  Dr. Nehemiah Massed: [phone]  - Dr. Laurence Ferrari: [phone]  - Dr. Nicole Kindred: [phone]  In the event of inclement weather, please call our main line at [phone] for an update on the status of any delays or closures.  Dermatology Medication Tips: Please keep the boxes that topical medications come in in order to help keep track of the instructions about where and how to use these. Pharmacies typically print the medication instructions only on the boxes and not directly on the medication tubes.   If your medication is too expensive, please contact our office at [phone] option 4 or send [place] a message through [place].   We are unable to tell what your co-pay for medications will be in advance as this is different depending on your insurance coverage. However, we may be able to find a substitute medication at lower cost or fill out paperwork to get insurance to cover a needed medication.   If a prior authorization is required to get your medication covered by your insurance company, please allow [place] 1-2 business days to complete this process.  Drug prices often vary depending on where the prescription is filled and some pharmacies may offer cheaper prices.  The website www.goodrx.com contains coupons for medications through different pharmacies. The prices here do not account for what the cost may be with help from insurance (it may be cheaper with your insurance), but the website can give you the price if you  did not use any insurance.  - You can print the associated coupon and take it with your prescription to the pharmacy.  - You may also stop by our office during regular business hours and pick up a GoodRx coupon card.  - If you need your prescription sent electronically to a different pharmacy, notify our office through [facility] or by phone at [phone] option 4.     Si Usted Necesita Algo Despus de Su Visita  Tambin puede enviarnos un mensaje a travs de Pharmacist, community. Por  lo general respondemos a los mensajes de MyChart en el transcurso de 1 a 2 das hbiles.  Para renovar recetas, por favor pida a su farmacia que se ponga en contacto con nuestra oficina. Harland Dingwall de fax es [place] [phone].  Si tiene un asunto urgente cuando la clnica est cerrada y que no puede esperar hasta el siguiente da hbil, puede llamar/localizar a su doctor(a) al nmero que aparece a continuacin.   Por favor, tenga en cuenta que aunque hacemos todo lo posible para estar disponibles para asuntos urgentes fuera del horario de [place], no estamos disponibles las 24 horas del da, los 7 das de la [place].   Si tiene un problema urgente y no puede comunicarse con nosotros, puede optar por buscar atencin mdica  en el consultorio de su doctor(a), en una clnica privada, en un centro de atencin urgente o en una sala de emergencias.  Si tiene Engineering geologist, por favor llame inmediatamente al 911 o vaya a la sala de emergencias.  Nmeros de bper  - Dr. Nehemiah Massed: [phone]  - Dra. Moye: [phone]  - Dra. Nicole Kindred: [phone]  En caso de inclemencias del [place], por favor llame a Johnsie Kindred principal al [phone] para una actualizacin sobre el [place] de cualquier retraso o cierre.  Consejos para la medicacin en dermatologa: Por favor, guarde las cajas en las que vienen los medicamentos de uso tpico para ayudarle a seguir las instrucciones sobre dnde y cmo usarlos. Las farmacias generalmente imprimen las instrucciones del medicamento slo en las cajas y no directamente en los tubos del [place].   Si su medicamento es muy caro, por favor, pngase en contacto con Zigmund Daniel llamando al [phone] y presione la opcin 4 o envenos un mensaje a travs de Pharmacist, community.   No podemos decirle cul ser su copago por los medicamentos por adelantado ya que esto es diferente dependiendo de la cobertura de su seguro. Sin embargo, es posible que podamos encontrar  un medicamento sustituto a Electrical engineer un formulario para que el seguro cubra el medicamento que se considera necesario.   Si se requiere una autorizacin previa para que su compaa de seguros [place] su medicamento, por favor permtanos de 1 a 2 das hbiles para completar este proceso.  Los precios de los medicamentos varan con frecuencia dependiendo del Environmental consultant de dnde se surte la receta y alguna farmacias pueden ofrecer precios ms baratos.  El sitio web www.goodrx.com tiene cupones para medicamentos de Airline pilot. Los precios aqu no tienen en cuenta lo que podra costar con la ayuda del seguro (puede ser ms barato con su seguro), pero el sitio web puede darle el precio si no utiliz Research scientist (physical sciences).  - Puede imprimir el cupn correspondiente y llevarlo con su receta a la farmacia.  - Tambin puede pasar por nuestra oficina durante el horario de atencin regular y Charity fundraiser una tarjeta de cupones de GoodRx.  - Si necesita que su receta se  enve electrnicamente a una farmacia diferente, informe a nuestra oficina a travs de MyChart de [place] o por telfono llamando al [phone] y presione la opcin 4.

## 2022-05-22 NOTE — Progress Notes (Signed)
   Follow-Up Visit   Subjective  [name] is a 89 y.o. female who presents for the following: Suture / Staple Removal (Right dorsal hand).  Daughter with patient   The following portions of the chart were reviewed this encounter and updated as appropriate:  Tobacco  Allergies  Meds  Problems  Med Hx  Surg Hx  Fam Hx      Review of Systems: No other skin or systemic complaints except as noted in HPI or Assessment and Plan.   Objective  Well appearing patient in no apparent distress; mood and affect are within normal limits.  A focused examination was performed including right dorsal hand. Relevant physical exam findings are noted in the Assessment and Plan.   Assessment & Plan   Encounter for Removal of Sutures Superficial dehiscence at center of would, as we discussed may happen - Incision site at the right dorsal hand is clean - Wound cleansed, sutures removed.  - After care instructions reviewed. Use hydrocolloid bandages. Can leave on up to 3 days.  - Discussed pathology results showing no residual SCC  - Scars remodel for a full year. - Once fully healed, can apply over-the-counter silicone scar cream each night to help with scar remodeling if desired. - Patient advised to call with any concerns or if they notice any new or changing lesions.    Return in about 2 weeks (around 06/05/2022) for excision site recheck.  I, Emelia Salisbury, CMA, am acting as scribe for Forest Gleason, MD.  Documentation: I have reviewed the above documentation for accuracy and completeness, and I agree with the above.  Forest Gleason, MD

## 2022-05-23 ENCOUNTER — Telehealth: Payer: Self-pay

## 2022-05-23 NOTE — Telephone Encounter (Signed)
Patient's daughter [name] called to say that she has noticed a bubble that has come up under the hydrocolloid bandage that was put on yesterday. She would like to know if she should leave this bandage on for the additional 2 days as instructed or should she go ahead and change it now.

## 2022-05-23 NOTE — Telephone Encounter (Signed)
It is fine to leave it for 2 more days. That is normal as it absorbs a small amount of drainage from the wound. Thank you!

## 2022-05-28 ENCOUNTER — Encounter: Payer: Self-pay | Admitting: Dermatology

## 2022-06-02 ENCOUNTER — Emergency Department: Payer: Medicare Other

## 2022-06-02 ENCOUNTER — Inpatient Hospital Stay
Admission: EM | Admit: 2022-06-02 | Discharge: 2022-06-11 | DRG: 871 | Disposition: A | Discharge status: Home Health | Payer: Medicare Other | Attending: Obstetrics and Gynecology | Admitting: Obstetrics and Gynecology

## 2022-06-02 DIAGNOSIS — I499 Cardiac arrhythmia, unspecified: Secondary | ICD-10-CM | POA: Diagnosis not present

## 2022-06-02 DIAGNOSIS — J810 Acute pulmonary edema: Secondary | ICD-10-CM | POA: Diagnosis not present

## 2022-06-02 DIAGNOSIS — I2510 Atherosclerotic heart disease of native coronary artery without angina pectoris: Secondary | ICD-10-CM | POA: Diagnosis not present

## 2022-06-02 DIAGNOSIS — G629 Polyneuropathy, unspecified: Secondary | ICD-10-CM | POA: Diagnosis present

## 2022-06-02 DIAGNOSIS — I071 Rheumatic tricuspid insufficiency: Secondary | ICD-10-CM | POA: Insufficient documentation

## 2022-06-02 DIAGNOSIS — Z90710 Acquired absence of both cervix and uterus: Secondary | ICD-10-CM

## 2022-06-02 DIAGNOSIS — A4189 Other specified sepsis: Principal | ICD-10-CM | POA: Diagnosis present

## 2022-06-02 DIAGNOSIS — Z7901 Long term (current) use of anticoagulants: Secondary | ICD-10-CM

## 2022-06-02 DIAGNOSIS — Z720 Tobacco use: Secondary | ICD-10-CM

## 2022-06-02 DIAGNOSIS — I4892 Unspecified atrial flutter: Secondary | ICD-10-CM | POA: Diagnosis present

## 2022-06-02 DIAGNOSIS — I4821 Permanent atrial fibrillation: Secondary | ICD-10-CM | POA: Diagnosis not present

## 2022-06-02 DIAGNOSIS — H9190 Unspecified hearing loss, unspecified ear: Secondary | ICD-10-CM | POA: Diagnosis present

## 2022-06-02 DIAGNOSIS — Z66 Do not resuscitate: Secondary | ICD-10-CM | POA: Diagnosis present

## 2022-06-02 DIAGNOSIS — E785 Hyperlipidemia, unspecified: Secondary | ICD-10-CM | POA: Diagnosis not present

## 2022-06-02 DIAGNOSIS — T380X5A Adverse effect of glucocorticoids and synthetic analogues, initial encounter: Secondary | ICD-10-CM | POA: Diagnosis not present

## 2022-06-02 DIAGNOSIS — R0689 Other abnormalities of breathing: Secondary | ICD-10-CM | POA: Diagnosis not present

## 2022-06-02 DIAGNOSIS — M810 Age-related osteoporosis without current pathological fracture: Secondary | ICD-10-CM | POA: Diagnosis not present

## 2022-06-02 DIAGNOSIS — K449 Diaphragmatic hernia without obstruction or gangrene: Secondary | ICD-10-CM | POA: Diagnosis not present

## 2022-06-02 DIAGNOSIS — J9601 Acute respiratory failure with hypoxia: Secondary | ICD-10-CM | POA: Diagnosis present

## 2022-06-02 DIAGNOSIS — E872 Acidosis, unspecified: Secondary | ICD-10-CM | POA: Diagnosis not present

## 2022-06-02 DIAGNOSIS — R739 Hyperglycemia, unspecified: Secondary | ICD-10-CM | POA: Diagnosis not present

## 2022-06-02 DIAGNOSIS — Z825 Family history of asthma and other chronic lower respiratory diseases: Secondary | ICD-10-CM

## 2022-06-02 DIAGNOSIS — I5033 Acute on chronic diastolic (congestive) heart failure: Secondary | ICD-10-CM | POA: Diagnosis not present

## 2022-06-02 DIAGNOSIS — I4819 Other persistent atrial fibrillation: Secondary | ICD-10-CM | POA: Diagnosis present

## 2022-06-02 DIAGNOSIS — I959 Hypotension, unspecified: Secondary | ICD-10-CM | POA: Diagnosis present

## 2022-06-02 DIAGNOSIS — Z8711 Personal history of peptic ulcer disease: Secondary | ICD-10-CM

## 2022-06-02 DIAGNOSIS — Z8619 Personal history of other infectious and parasitic diseases: Secondary | ICD-10-CM

## 2022-06-02 DIAGNOSIS — Z833 Family history of diabetes mellitus: Secondary | ICD-10-CM

## 2022-06-02 DIAGNOSIS — R6889 Other general symptoms and signs: Secondary | ICD-10-CM | POA: Diagnosis not present

## 2022-06-02 DIAGNOSIS — Z880 Allergy status to penicillin: Secondary | ICD-10-CM

## 2022-06-02 DIAGNOSIS — K219 Gastro-esophageal reflux disease without esophagitis: Secondary | ICD-10-CM | POA: Diagnosis present

## 2022-06-02 DIAGNOSIS — I5032 Chronic diastolic (congestive) heart failure: Secondary | ICD-10-CM | POA: Diagnosis present

## 2022-06-02 DIAGNOSIS — E876 Hypokalemia: Secondary | ICD-10-CM | POA: Diagnosis present

## 2022-06-02 DIAGNOSIS — I10 Essential (primary) hypertension: Secondary | ICD-10-CM | POA: Diagnosis present

## 2022-06-02 DIAGNOSIS — I110 Hypertensive heart disease with heart failure: Secondary | ICD-10-CM | POA: Diagnosis present

## 2022-06-02 DIAGNOSIS — Z8249 Family history of ischemic heart disease and other diseases of the circulatory system: Secondary | ICD-10-CM

## 2022-06-02 DIAGNOSIS — Z823 Family history of stroke: Secondary | ICD-10-CM

## 2022-06-02 DIAGNOSIS — Z85828 Personal history of other malignant neoplasm of skin: Secondary | ICD-10-CM

## 2022-06-02 DIAGNOSIS — Z79899 Other long term (current) drug therapy: Secondary | ICD-10-CM

## 2022-06-02 DIAGNOSIS — R531 Weakness: Secondary | ICD-10-CM | POA: Diagnosis not present

## 2022-06-02 DIAGNOSIS — G2581 Restless legs syndrome: Secondary | ICD-10-CM | POA: Diagnosis not present

## 2022-06-02 DIAGNOSIS — M069 Rheumatoid arthritis, unspecified: Secondary | ICD-10-CM | POA: Diagnosis not present

## 2022-06-02 DIAGNOSIS — A419 Sepsis, unspecified organism: Secondary | ICD-10-CM | POA: Diagnosis not present

## 2022-06-02 DIAGNOSIS — I739 Peripheral vascular disease, unspecified: Secondary | ICD-10-CM | POA: Diagnosis present

## 2022-06-02 DIAGNOSIS — I4891 Unspecified atrial fibrillation: Secondary | ICD-10-CM | POA: Diagnosis not present

## 2022-06-02 DIAGNOSIS — I079 Rheumatic tricuspid valve disease, unspecified: Secondary | ICD-10-CM | POA: Diagnosis present

## 2022-06-02 DIAGNOSIS — U071 COVID-19: Secondary | ICD-10-CM | POA: Diagnosis present

## 2022-06-02 DIAGNOSIS — I2489 Other forms of acute ischemic heart disease: Secondary | ICD-10-CM | POA: Diagnosis present

## 2022-06-02 DIAGNOSIS — Z86008 Personal history of in-situ neoplasm of other site: Secondary | ICD-10-CM

## 2022-06-02 DIAGNOSIS — J811 Chronic pulmonary edema: Secondary | ICD-10-CM | POA: Diagnosis present

## 2022-06-02 DIAGNOSIS — Z743 Need for continuous supervision: Secondary | ICD-10-CM | POA: Diagnosis not present

## 2022-06-02 MED ORDER — LACTATED RINGERS IV BOLUS (SEPSIS)
1000.0000 mL | Freq: Once | INTRAVENOUS | Status: AC
  Administered 2022-06-03: 1000 mL via INTRAVENOUS

## 2022-06-02 MED ORDER — SODIUM CHLORIDE 0.9 % IV SOLN
2.0000 g | Freq: Once | INTRAVENOUS | Status: AC
  Administered 2022-06-03: 2 g via INTRAVENOUS
  Filled 2022-06-02: qty 10

## 2022-06-02 MED ORDER — LACTATED RINGERS IV SOLN: INTRAVENOUS | Status: DC

## 2022-06-02 MED ORDER — VANCOMYCIN HCL IN DEXTROSE 1-5 GM/200ML-% IV SOLN
1000.0000 mg | Freq: Once | INTRAVENOUS | Status: AC
  Administered 2022-06-03: 1000 mg via INTRAVENOUS
  Filled 2022-06-02: qty 200

## 2022-06-02 MED ORDER — METRONIDAZOLE 500 MG/100ML IV SOLN
500.0000 mg | Freq: Once | INTRAVENOUS | Status: AC
  Administered 2022-06-03: 500 mg via INTRAVENOUS
  Filled 2022-06-02: qty 100

## 2022-06-02 MED ORDER — ACETAMINOPHEN 500 MG PO TABS
1000.0000 mg | ORAL_TABLET | Freq: Once | ORAL | Status: AC
  Administered 2022-06-03: 1000 mg via ORAL
  Filled 2022-06-02: qty 2

## 2022-06-02 NOTE — ED Triage Notes (Signed)
Medic called out by daughter for lift assistance. Brought in via medic for new onset weakness, new onset of inability to ambulate, strong urine smell, decrease PO intake, decrease urine.

## 2022-06-02 NOTE — ED Provider Notes (Signed)
[facility] Provider Note    Event Date/Time   First MD Initiated Contact with Patient 06/02/22 2321     (approximate)   History   Weakness   HPI  [name] is a 89 y.o. female with history of hypertension, hyperlipidemia, rheumatoid arthritis who lives at home with her daughter presents today with EMS for generalized weakness.  Reports she has had a productive cough.  She is febrile here but was not aware that she was having fevers.  No chest pain, shortness of breath, vomiting, diarrhea, dysuria.  EMS reports a strong smell of urine.  EMS was called for lift support from her daughter.  Patient normally able to ambulate.  No recent falls.  Does not wear oxygen at home.   History provided by patient, EMS.    Past Medical History:  Diagnosis Date   Abdominal aortic atherosclerosis ([place]) 09/2015   by xray   Anxiety    BCC (basal cell carcinoma of skin) 05/2011   on nose   BCC (basal cell carcinoma of skin) 08/09/2020   upper chest, [facility] 12/27/20   Breast lesion    a. diagnosed with complex sclerosing lesion with calcifications of the right breast in 08/2016   Chest pain    a. nuclear stress test 03/2012: normal; b. 09/2018 MV: EF >65%, no isch/scar; c. 07/2019 Cath: LM nl, LAD 40ost, 58m LCX nl, RCA 50ost/p (iFR 0.98). EF 55-65% ->Med Rx.   DDD (degenerative disc disease), lumbosacral 12/06/1992   Gallstones 09/2015   incidentally by xray   GERD (gastroesophageal reflux disease) 05/1999   HERPES ZOSTER 04/13/2007   Qualifier: Diagnosis of  By: BMaxie BetterFNP, BRosalita Levan   Hiatal hernia    History of shingles    ophthalmic, takes acyclovir daily   Hyperlipidemia 12/2003   Hypertension 1990   Mitral regurgitation    a. echo 03/2012: EF 60-65%, no RWMA, mild to mod AI/MR, mod TR, PASP 37 mmHg; b. 05/2019 Echo: EF 60-65%, mod MR.   Osteoarthritis 08/21/1989   Osteoporosis with fracture 11/1997   with compression fracture T12   PAD  (peripheral artery disease) ([place]    a. 01/2020 PTA of R Peroneal and R PT; b. 09/2021 ABI/TBI: R 1.04/09.2, L 1.36/0.17.   Permanent atrial fibrillation ([place] 03/16/2012   a. CHADS2VASc => 5 (HTN, age x 2, vascular disease, sex category)-->Eliquis 5 BID;  b. Recurrent AF in 06/2016;  c. 01/2017 s/p DCCV;  d. 02/2017 recurrent AFib->Amio added->DCCV; e. 03/2017 recurrent AFib, s/p reload of amio and DCCV; f. 12/2020 DCCV-->recurrent Afib, Amio d/c-->rate control.   POSTHERPETIC NEURALGIA 04/20/2007   Qualifier: Diagnosis of  By: BMaxie BetterFNP, BRosalita Levan   Rheumatoid arthritis ([place]    RLS (restless legs syndrome)    SCC (squamous cell carcinoma) 05/01/2021   right posterior flank inferior to axilla - well differentiated squamous cell carcinoma with superificial infiltration, base involved. EDC 06/06/2021   Squamous cell carcinoma in situ 04/17/2022   Right dorsal hand, proximal. SCCis, inverted. Excised 05/08/2022   Squamous cell carcinoma of skin 12/19/2021   Proximal right dorsal hand. K[facility] type[facility]   Squamous cell carcinoma of skin 12/19/2021   Distal right [facility]. [facility]    Past Surgical History:  Procedure Laterality Date   ABDOMINAL HYSTERECTOMY  Age 89- 16  S/P TAH   abdominal ultrasound  04/23/2004   Gallstones   BREAST EXCISIONAL BIOPSY Right 2018  neg/benign complex sclerosing lesion   BREAST LUMPECTOMY WITH RADIOACTIVE SEED LOCALIZATION Right 10/2016   breast lumpectomy with radioactive seed localization and margin assessment for complex sclerosing lesion Renelda Loma)   BREAST LUMPECTOMY WITH RADIOACTIVE SEED LOCALIZATION Right 11/05/2016   Procedure: RIGHT BREAST LUMPECTOMY WITH RADIOACTIVE SEED LOCALIZATION;  Surgeon: Fanny Skates, MD;  Location: [place];  Service: General;  Laterality: Right;   CARDIAC CATHETERIZATION     CARDIOVERSION N/A 01/31/2017   Procedure: CARDIOVERSION;  Surgeon: Wellington Hampshire, MD;   Location: [facility];  Service: Cardiovascular;  Laterality: N/A;   CARDIOVERSION N/A 03/03/2017   Procedure: CARDIOVERSION;  Surgeon: Wellington Hampshire, MD;  Location: [facility];  Service: Cardiovascular;  Laterality: N/A;   CARDIOVERSION N/A 04/02/2017   Procedure: CARDIOVERSION;  Surgeon: Wellington Hampshire, MD;  Location: [facility];  Service: Cardiovascular;  Laterality: N/A;   CARDIOVERSION N/A 01/16/2018   Procedure: CARDIOVERSION;  Surgeon: Wellington Hampshire, MD;  Location: [facility];  Service: Cardiovascular;  Laterality: N/A;   CARDIOVERSION N/A 12/25/2020   Procedure: CARDIOVERSION;  Surgeon: Wellington Hampshire, MD;  Location: [facility];  Service: Cardiovascular;  Laterality: N/A;   CATARACT EXTRACTION  05/2010   left eye   CERVICAL DISCECTOMY  1992   Fusion (Dr. Joya Salm)   ERCP N/A 10/30/2020   Procedure: ENDOSCOPIC RETROGRADE CHOLANGIOPANCREATOGRAPHY (ERCP);  Surgeon: Jackquline Denmark, MD;  Location: [facility] END[facility];  Service: Endoscopy;  Laterality: N/A;   ESI Bilateral 01/2016   S1 transforaminal ESI x3   ESOPHAGOGASTRODUODENOSCOPY  01/01/2002   with ulcer, bx. neg; + stricture gastric ulcer with hemorrhage   ESOPHAGOGASTRODUODENOSCOPY  04/23/2004   Esophageal stricture -- dilated   INTRAVASCULAR PRESSURE WIRE/FFR STUDY N/A 07/12/2019   Procedure: INTRAVASCULAR PRESSURE WIRE/FFR STUDY;  Surgeon: Wellington Hampshire, MD;  Location: [place] CV LAB;  Service: Cardiovascular;  Laterality: N/A;   LOWER EXTREMITY ANGIOGRAPHY Left 02/01/2019   LOWER EXTREMITY ANGIOGRAPHY;  Surgeon: Algernon Huxley, MD   LOWER EXTREMITY ANGIOGRAPHY Right 03/25/2019   Procedure: LOWER EXTREMITY ANGIOGRAPHY;  Surgeon: Algernon Huxley, MD;  Location: [place] CV LAB;  Service: Cardiovascular;  Laterality: Right;   LOWER EXTREMITY ANGIOGRAPHY Right 08/02/2019   Procedure: LOWER EXTREMITY ANGIOGRAPHY;  Surgeon: Algernon Huxley, MD;  Location: [place] CV LAB;  Service: Cardiovascular;  Laterality: Right;   nuclear  stress test  03/2012   no ischemia   PERIPHERAL VASCULAR BALLOON ANGIOPLASTY Left 01/2019   Percutaneous transluminal angioplasty of left anterior and posterior tibial artery with 2.5 mm diameter by 22 cm length angioplasty balloon ([place])   REMOVAL OF STONES  10/30/2020   Procedure: REMOVAL OF STONES;  Surgeon: Jackquline Denmark, MD;  Location: [facility] ENDOSCOPY;  Service: Endoscopy;;   RIGHT/LEFT HEART CATH AND CORONARY ANGIOGRAPHY Bilateral 07/12/2019   Procedure: RIGHT/LEFT HEART CATH AND CORONARY ANGIOGRAPHY;  Surgeon: Wellington Hampshire, MD;  Location: [place] CV LAB;  Service: Cardiovascular;  Laterality: Bilateral;   SKIN CANCER EXCISION  05/20/2011   BCC from nose   SPHINCTEROTOMY  10/30/2020   Procedure: SPHINCTEROTOMY;  Surgeon: Jackquline Denmark, MD;  Location: [facility] ENDOSCOPY;  Service: Endoscopy;;   US ECHOCARDIOGRAPHY  03/2012   in flutter, EF 60%, mild-mod aortic, mitral, tricuspid regurg, mildly dilated LA    MEDICATIONS:  Prior to Admission medications   Medication Sig Start Date End Date Taking? Authorizing Provider  acyclovir (ZOVIRAX) 400 MG tablet Take 400 mg by mouth 2 (two) times daily.   Yes [provider]  Alpha-Lipoic  Acid 600 MG TABS Take 600 mg by mouth daily.   Yes [provider]  atorvastatin (LIPITOR) 20 MG tablet Take 1 tablet (20 mg total) by mouth daily. 09/12/21  Yes Ria Bush, MD  Docusate Calcium (STOOL SOFTENER PO) Take 1 capsule by mouth daily.   Yes [provider]  DULoxetine (CYMBALTA) 60 MG capsule TAKE 1 CAPSULE BY MOUTH ONCE DAILY 01/28/22  Yes Ria Bush, MD  ELIQUIS 5 MG TABS tablet TAKE 1 TABLET BY MOUTH TWICE DAILY 01/28/22  Yes Wellington Hampshire, MD  furosemide (LASIX) 20 MG tablet TAKE ONE TABLET BY MOUTH ONCE DAILY 05/15/22  Yes Wellington Hampshire, MD  gabapentin (NEURONTIN) 400 MG capsule TAKE 1 CAPSULE BY MOUTH AT BEDTIME AND SECOND DOSE DURING THE DAY AS NEEDED 09/13/21  Yes Ria Bush, MD  lisinopril  (ZESTRIL) 20 MG tablet TAKE ONE TABLET BY MOUTH ONCE DAILY 04/24/22  Yes Wellington Hampshire, MD  memantine (NAMENDA) 10 MG tablet Take 1 tablet (10 mg total) by mouth 2 (two) times daily. 04/14/22  Yes Ria Bush, MD  metoprolol succinate (TOPROL-XL) 100 MG 24 hr tablet TAKE 1 TABLET BY MOUTH TWICE DAILY 03/18/22  Yes Wellington Hampshire, MD  mometasone (ELOCON) 0.1 % lotion 2-4 drops daily. 05/15/22  Yes [provider]  pantoprazole (PROTONIX) 40 MG tablet Take 1 tablet (40 mg total) by mouth daily. 09/13/21  Yes Ria Bush, MD  prednisoLONE acetate (PRED FORTE) 1 % ophthalmic suspension Place 1 drop into the left eye daily.    Yes [provider]  rOPINIRole (REQUIP) 1 MG tablet Take 2.5 tablets (2.5 mg total) by mouth at bedtime. 09/13/21  Yes Ria Bush, MD  tiZANidine (ZANAFLEX) 2 MG tablet Take 2 mg by mouth daily.   Yes [provider]  Vitamin D, Ergocalciferol, (DRISDOL) 1.25 MG (50000 UNIT) CAPS capsule Take 1 capsule (50,000 Units total) by mouth every 7 (seven) days. 09/13/21  Yes Ria Bush, MD  acetaminophen (TYLENOL) 500 MG tablet Take 500 mg by mouth every 6 (six) hours as needed for mild pain.    [provider]  doxycycline (MONODOX) 100 MG capsule Take 1 capsule (100 mg total) by mouth 2 (two) times daily. Take with food Patient not taking: Reported on 06/02/2022 05/08/22   Laurence Ferrari, [place], MD  hydrocortisone (ANUSOL-HC) 2.5 % rectal cream Place 1 application rectally daily as needed for hemorrhoids or anal itching. For hemmorhoid 05/30/21   Tower, Wynelle Fanny, MD  mupirocin ointment (BACTROBAN) 2 % Apply 1 Application topically daily. Patient not taking: Reported on 06/02/2022 05/08/22   Laurence Ferrari, [place], MD  polyethylene glycol (MIRALAX / GLYCOLAX) 17 g packet Take 17 g by mouth daily as needed for mild constipation. 10/27/19   Nolberto Hanlon, MD  traMADol (ULTRAM) 50 MG tablet Take 1-2 tablets (50-100 mg total) by mouth at bedtime as needed.  11/14/21   Ria Bush, MD    Physical Exam   Triage Vital Signs: ED Triage Vitals [06/02/22 2327]  Enc Vitals Group     BP (!) 154/66     Pulse Rate 78     Resp (!) 28     Temp (!) 101.7 F (38.7 C)     Temp Source Rectal     SpO2 94 %     Weight      Height      Head Circumference      Peak Flow      Pain Score  Pain Loc      Pain Edu?      Excl. in [place]?     Most recent vital signs: Vitals:   06/03/22 0015 06/03/22 0030  BP: 127/80   Pulse: 76   Resp:  (!) 26  Temp:    SpO2: 93%     CONSTITUTIONAL: Alert, responds appropriately to questions.  Elderly, hard of hearing HEAD: Normocephalic, atraumatic EYES: Conjunctivae clear, pupils appear equal, sclera nonicteric ENT: normal nose; moist mucous membranes NECK: Supple, normal ROM CARD: Irregular and rate controlled; S1 and S2 appreciated RESP: Slightly tachypneic.  Sats 90% on room air.  Rhonchorous breath sounds.  No wheezing, rales. ABD/GI: Non-distended; soft, non-tender, no rebound, no guarding, no peritoneal signs BACK: The back appears normal EXT: Normal ROM in all joints; no deformity noted, no edema SKIN: Normal color for age and race; warm; no rash on exposed skin NEURO: Moves all extremities equally, normal speech no facial asymmetry PSYCH: The patient's mood and manner are appropriate.   ED Results / Procedures / Treatments   LABS: (all labs ordered are listed, but only abnormal results are displayed) Labs Reviewed  RESP PANEL BY RT-PCR (RSV, FLU A&B, COVID)  [id number] - Abnormal; Notable for the following components:      Result Value   SARS Coronavirus 2 by RT PCR POSITIVE (*)    All other components within normal limits  COMPREHENSIVE METABOLIC PANEL - Abnormal; Notable for the following components:   Potassium 2.5 (*)    Chloride 96 (*)    Glucose, Bld 114 (*)    GFR, Estimated 55 (*)    All other components within normal limits  PROTIME-INR - Abnormal; Notable for the following  components:   Prothrombin Time 19.5 (*)    INR 1.7 (*)    All other components within normal limits  URINALYSIS, COMPLETE (UACMP) WITH MICROSCOPIC - Abnormal; Notable for the following components:   Color, Urine YELLOW (*)    APPearance CLEAR (*)    All other components within normal limits  CULTURE, BLOOD (ROUTINE X 2)  CULTURE, BLOOD (ROUTINE X 2)  URINE CULTURE  LACTIC ACID, PLASMA  CBC WITH DIFFERENTIAL/PLATELET  APTT  PROCALCITONIN  MAGNESIUM  LACTIC ACID, PLASMA  TROPONIN I (HIGH SENSITIVITY)     EKG:  EKG Interpretation  Date/Time:  Sunday June 02 2022 23:24:18 EST Ventricular Rate:  85 PR Interval:    QRS Duration: 91 QT Interval:  410 QTC Calculation: 488 R Axis:   43 Text Interpretation: Atrial fibrillation Ventricular premature complex Repol abnrm, severe global ischemia (LM/MVD) Baseline wander in lead(s) V3 Confirmed by Pryor Curia [phone]) on 06/02/2022 11:48:18 PM         RADIOLOGY: My personal review and interpretation of imaging: X-ray clear.  I have personally reviewed all radiology reports.   DG Chest Port 1 View  Result Date: 06/03/2022 CLINICAL DATA:  Sepsis EXAM: PORTABLE CHEST 1 VIEW COMPARISON:  05/30/2021 FINDINGS: The lungs are symmetrically well expanded. Right cardiophrenic angle opacity represents a moderate hiatal hernia better seen on CT examination of 07/06/2019. Lungs are otherwise clear. No pneumothorax or pleural effusion. Cardiac size is at the upper limits of normal, likely accentuated by semi-erect positioning. Pulmonary vascularity is normal. No acute bone abnormality. IMPRESSION: 1. No active disease. 2. Moderate hiatal hernia. Electronically Signed   By: Fidela Salisbury M.D.   On: 06/03/2022 00:04     PROCEDURES:  Critical Care performed: Yes, see critical care procedure note(s)   CRITICAL  CARE Performed by: Pryor Curia   Total critical care time: 45 minutes  Critical care time was exclusive of separately billable  procedures and treating other patients.  Critical care was necessary to treat or prevent imminent or life-threatening deterioration.  Critical care was time spent personally by me on the following activities: development of treatment plan with patient and/or surrogate as well as nursing, discussions with consultants, evaluation of patient's response to treatment, examination of patient, obtaining history from patient or surrogate, ordering and performing treatments and interventions, ordering and review of laboratory studies, ordering and review of radiographic studies, pulse oximetry and re-evaluation of patient's condition.   Marland Kitchen1-3 Lead EKG Interpretation  Performed by: Kristy Schomburg, Delice Bison, DO Authorized by: Tamira Ryland, Delice Bison, DO     Interpretation: abnormal     ECG rate:  78   ECG rate assessment: normal     Rhythm: atrial fibrillation     Ectopy: none     Conduction: normal       IMPRESSION / MDM / ASSESSMENT AND PLAN / ED COURSE  I reviewed the triage vital signs and the nursing notes.    Patient here generalized weakness.  Found to be febrile, tachypneic with low oxygen sats.  The patient is on the cardiac monitor to evaluate for evidence of arrhythmia and/or significant heart rate changes.   DIFFERENTIAL DIAGNOSIS (includes but not limited to):   Pneumonia, UTI, COVID, flu, other viral URI, anemia, electrolyte derangement, dehydration, bacteremia, sepsis   Patient's presentation is most consistent with acute presentation with potential threat to life or bodily function.   PLAN: Will obtain septic workup with labs, cultures, urine, chest x-ray, COVID and flu swab.  Will start with 1 L IV fluids given no signs of hypotension currently.  Will give broad-spectrum antibiotics.  No focal neurologic deficits to suggest stroke.   MEDICATIONS GIVEN IN ED: Medications  lactated ringers infusion (has no administration in time range)  metroNIDAZOLE (FLAGYL) IVPB 500 mg (500 mg  Intravenous New Bag/Given 06/03/22 0100)  vancomycin (VANCOCIN) IVPB 1000 mg/200 mL premix (has no administration in time range)  potassium chloride 10 mEq in 100 mL IVPB (10 mEq Intravenous New Bag/Given 06/03/22 0112)  lactated ringers bolus 1,000 mL (1,000 mLs Intravenous New Bag/Given 06/03/22 0014)  aztreonam (AZACTAM) 2 g in sodium chloride 0.9 % 100 mL IVPB (0 g Intravenous Stopped 06/03/22 0056)  acetaminophen (TYLENOL) tablet 1,000 mg (1,000 mg Oral Given 06/03/22 0015)  potassium chloride SA (KLOR-CON M) CR tablet 40 mEq (40 mEq Oral Given 06/03/22 0109)     ED COURSE: Patient has no leukocytosis.  Normal hemoglobin.  Potassium of 2.5.  Will give IV replacement.  Lactic is 1.5.  Procalcitonin negative.  Patient is COVID positive and hypoxic here.  Chest x-ray reviewed and interpreted by myself and the radiologist is unremarkable.  Urine does not appear infected.  Troponin negative.  Will discuss with hospitalist for admission.  Daughter at bedside has been updated.   CONSULTS:  Consulted and discussed patient's case with hospitalist, Dr. Sidney Ace.  I have recommended admission and consulting physician agrees and will place admission orders.  Patient (and family if present) agree with this plan.   I reviewed all nursing notes, vitals, pertinent previous records.  All labs, EKGs, imaging ordered have been independently reviewed and interpreted by myself.    OUTSIDE RECORDS REVIEWED: Reviewed last PMR note on 09/20/2021.       FINAL CLINICAL IMPRESSION(S) / ED DIAGNOSES   Final diagnoses:  Acute sepsis (HCC)  Generalized weakness  COVID-19  Hypokalemia  Acute respiratory failure with hypoxia ([place])     Rx / DC Orders   ED Discharge Orders     None        Note:  This document was prepared using Dragon voice recognition software and may include unintentional dictation errors.   Verley Pariseau, Delice Bison, DO 06/03/22 [phone]

## 2022-06-02 NOTE — Progress Notes (Signed)
CODE SEPSIS - PHARMACY COMMUNICATION  **Broad Spectrum Antibiotics should be administered within 1 hour of Sepsis diagnosis**  Time Code Sepsis Called/Page Received: 2351  Antibiotics Ordered: Aztreonam & Vancomycin  Time of 1st antibiotic administration: 0008  Renda Rolls, PharmD, [facility] 06/02/2022 11:51 PM

## 2022-06-03 ENCOUNTER — Ambulatory Visit: Payer: Medicare Other | Admitting: Family Medicine

## 2022-06-03 ENCOUNTER — Encounter: Payer: Self-pay | Admitting: Family Medicine

## 2022-06-03 ENCOUNTER — Other Ambulatory Visit: Payer: Self-pay

## 2022-06-03 DIAGNOSIS — I959 Hypotension, unspecified: Secondary | ICD-10-CM | POA: Insufficient documentation

## 2022-06-03 DIAGNOSIS — R739 Hyperglycemia, unspecified: Secondary | ICD-10-CM | POA: Diagnosis not present

## 2022-06-03 DIAGNOSIS — H9201 Otalgia, right ear: Secondary | ICD-10-CM | POA: Diagnosis not present

## 2022-06-03 DIAGNOSIS — E872 Acidosis, unspecified: Secondary | ICD-10-CM | POA: Diagnosis not present

## 2022-06-03 DIAGNOSIS — I5033 Acute on chronic diastolic (congestive) heart failure: Secondary | ICD-10-CM | POA: Diagnosis not present

## 2022-06-03 DIAGNOSIS — H903 Sensorineural hearing loss, bilateral: Secondary | ICD-10-CM | POA: Diagnosis not present

## 2022-06-03 DIAGNOSIS — E876 Hypokalemia: Secondary | ICD-10-CM

## 2022-06-03 DIAGNOSIS — I739 Peripheral vascular disease, unspecified: Secondary | ICD-10-CM | POA: Diagnosis present

## 2022-06-03 DIAGNOSIS — I10 Essential (primary) hypertension: Secondary | ICD-10-CM

## 2022-06-03 DIAGNOSIS — U071 COVID-19: Secondary | ICD-10-CM | POA: Diagnosis present

## 2022-06-03 DIAGNOSIS — I2510 Atherosclerotic heart disease of native coronary artery without angina pectoris: Secondary | ICD-10-CM | POA: Diagnosis present

## 2022-06-03 DIAGNOSIS — K449 Diaphragmatic hernia without obstruction or gangrene: Secondary | ICD-10-CM | POA: Diagnosis not present

## 2022-06-03 DIAGNOSIS — I4821 Permanent atrial fibrillation: Secondary | ICD-10-CM | POA: Diagnosis present

## 2022-06-03 DIAGNOSIS — R0902 Hypoxemia: Secondary | ICD-10-CM | POA: Diagnosis not present

## 2022-06-03 DIAGNOSIS — T380X5A Adverse effect of glucocorticoids and synthetic analogues, initial encounter: Secondary | ICD-10-CM | POA: Diagnosis not present

## 2022-06-03 DIAGNOSIS — J9601 Acute respiratory failure with hypoxia: Secondary | ICD-10-CM | POA: Diagnosis not present

## 2022-06-03 DIAGNOSIS — J810 Acute pulmonary edema: Secondary | ICD-10-CM | POA: Diagnosis not present

## 2022-06-03 DIAGNOSIS — I110 Hypertensive heart disease with heart failure: Secondary | ICD-10-CM | POA: Diagnosis present

## 2022-06-03 DIAGNOSIS — G629 Polyneuropathy, unspecified: Secondary | ICD-10-CM | POA: Diagnosis present

## 2022-06-03 DIAGNOSIS — I4892 Unspecified atrial flutter: Secondary | ICD-10-CM | POA: Diagnosis present

## 2022-06-03 DIAGNOSIS — K219 Gastro-esophageal reflux disease without esophagitis: Secondary | ICD-10-CM | POA: Diagnosis present

## 2022-06-03 DIAGNOSIS — Z66 Do not resuscitate: Secondary | ICD-10-CM | POA: Diagnosis present

## 2022-06-03 DIAGNOSIS — J811 Chronic pulmonary edema: Secondary | ICD-10-CM | POA: Diagnosis present

## 2022-06-03 DIAGNOSIS — E785 Hyperlipidemia, unspecified: Secondary | ICD-10-CM | POA: Diagnosis not present

## 2022-06-03 DIAGNOSIS — A419 Sepsis, unspecified organism: Secondary | ICD-10-CM | POA: Diagnosis not present

## 2022-06-03 DIAGNOSIS — M069 Rheumatoid arthritis, unspecified: Secondary | ICD-10-CM | POA: Diagnosis present

## 2022-06-03 DIAGNOSIS — I079 Rheumatic tricuspid valve disease, unspecified: Secondary | ICD-10-CM | POA: Diagnosis present

## 2022-06-03 DIAGNOSIS — R531 Weakness: Secondary | ICD-10-CM

## 2022-06-03 DIAGNOSIS — I4891 Unspecified atrial fibrillation: Secondary | ICD-10-CM | POA: Diagnosis not present

## 2022-06-03 DIAGNOSIS — G2581 Restless legs syndrome: Secondary | ICD-10-CM | POA: Diagnosis present

## 2022-06-03 DIAGNOSIS — A4189 Other specified sepsis: Secondary | ICD-10-CM | POA: Diagnosis present

## 2022-06-03 DIAGNOSIS — M810 Age-related osteoporosis without current pathological fracture: Secondary | ICD-10-CM | POA: Diagnosis present

## 2022-06-03 DIAGNOSIS — I2489 Other forms of acute ischemic heart disease: Secondary | ICD-10-CM | POA: Diagnosis present

## 2022-06-03 LAB — URINALYSIS, COMPLETE (UACMP) WITH MICROSCOPIC
Bacteria, UA: NONE SEEN
Bilirubin Urine: NEGATIVE
Glucose, UA: NEGATIVE mg/dL
Hgb urine dipstick: NEGATIVE
Ketones, ur: NEGATIVE mg/dL
Leukocytes,Ua: NEGATIVE
Nitrite: NEGATIVE
Protein, ur: NEGATIVE mg/dL
Specific Gravity, Urine: 1.015 (ref 1.005–1.030)
pH: 6 (ref 5.0–8.0)

## 2022-06-03 LAB — COMPREHENSIVE METABOLIC PANEL
ALT: 11 U/L (ref 0–44)
ALT: 14 U/L (ref 0–44)
AST: 19 U/L (ref 15–41)
AST: 25 U/L (ref 15–41)
Albumin: 3.2 g/dL — ABNORMAL LOW (ref 3.5–5.0)
Albumin: 3.6 g/dL (ref 3.5–5.0)
Alkaline Phosphatase: 60 U/L (ref 38–126)
Alkaline Phosphatase: 63 U/L (ref 38–126)
Anion gap: 14 (ref 5–15)
Anion gap: 8 (ref 5–15)
BUN: 11 mg/dL (ref 8–23)
BUN: 11 mg/dL (ref 8–23)
CO2: 28 mmol/L (ref 22–32)
CO2: 29 mmol/L (ref 22–32)
Calcium: 8.8 mg/dL — ABNORMAL LOW (ref 8.9–10.3)
Calcium: 9.2 mg/dL (ref 8.9–10.3)
Chloride: 103 mmol/L (ref 98–111)
Chloride: 96 mmol/L — ABNORMAL LOW (ref 98–111)
Creatinine, Ser: 0.83 mg/dL (ref 0.44–1.00)
Creatinine, Ser: 0.98 mg/dL (ref 0.44–1.00)
GFR, Estimated: 55 mL/min — ABNORMAL LOW (ref >60)
GFR, Estimated: >60 mL/min (ref >60)
Glucose, Bld: 114 mg/dL — ABNORMAL HIGH (ref 70–99)
Glucose, Bld: 127 mg/dL — ABNORMAL HIGH (ref 70–99)
Potassium: 2.5 mmol/L — CL (ref 3.5–5.1)
Potassium: 4.3 mmol/L (ref 3.5–5.1)
Sodium: 139 mmol/L (ref 135–145)
Sodium: 139 mmol/L (ref 135–145)
Total Bilirubin: 1 mg/dL (ref 0.3–1.2)
Total Bilirubin: 1.2 mg/dL (ref 0.3–1.2)
Total Protein: 6.5 g/dL (ref 6.5–8.1)
Total Protein: 6.9 g/dL (ref 6.5–8.1)

## 2022-06-03 LAB — TROPONIN I (HIGH SENSITIVITY)
Troponin I (High Sensitivity): 12 ng/L (ref <18)
Troponin I (High Sensitivity): 12 ng/L (ref <18)
Troponin I (High Sensitivity): 14 ng/L (ref <18)

## 2022-06-03 LAB — CBC WITH DIFFERENTIAL/PLATELET
Abs Immature Granulocytes: 0.02 10*3/uL (ref 0.00–0.07)
Abs Immature Granulocytes: 0.03 10*3/uL (ref 0.00–0.07)
Basophils Absolute: 0 10*3/uL (ref 0.0–0.1)
Basophils Absolute: 0.1 10*3/uL (ref 0.0–0.1)
Basophils Relative: 1 %
Basophils Relative: 1 %
Eosinophils Absolute: 0 10*3/uL (ref 0.0–0.5)
Eosinophils Absolute: 0.1 10*3/uL (ref 0.0–0.5)
Eosinophils Relative: 0 %
Eosinophils Relative: 1 %
HCT: 39 % (ref 36.0–46.0)
HCT: 40.8 % (ref 36.0–46.0)
Hemoglobin: 12.9 g/dL (ref 12.0–15.0)
Hemoglobin: 13.7 g/dL (ref 12.0–15.0)
Immature Granulocytes: 0 %
Immature Granulocytes: 0 %
Lymphocytes Relative: 14 %
Lymphocytes Relative: 16 %
Lymphs Abs: 1.1 10*3/uL (ref 0.7–4.0)
Lymphs Abs: 1.6 10*3/uL (ref 0.7–4.0)
MCH: 31.9 pg (ref 26.0–34.0)
MCH: 32 pg (ref 26.0–34.0)
MCHC: 33.1 g/dL (ref 30.0–36.0)
MCHC: 33.6 g/dL (ref 30.0–36.0)
MCV: 95.3 fL (ref 80.0–100.0)
MCV: 96.5 fL (ref 80.0–100.0)
Monocytes Absolute: 0.2 10*3/uL (ref 0.1–1.0)
Monocytes Absolute: 0.9 10*3/uL (ref 0.1–1.0)
Monocytes Relative: 3 %
Monocytes Relative: 9 %
Neutro Abs: 6.1 10*3/uL (ref 1.7–7.7)
Neutro Abs: 7 10*3/uL (ref 1.7–7.7)
Neutrophils Relative %: 73 %
Neutrophils Relative %: 82 %
Platelets: 203 10*3/uL (ref 150–400)
Platelets: 240 10*3/uL (ref 150–400)
RBC: 4.04 MIL/uL (ref 3.87–5.11)
RBC: 4.28 MIL/uL (ref 3.87–5.11)
RDW: 13.7 % (ref 11.5–15.5)
RDW: 13.8 % (ref 11.5–15.5)
WBC: 7.4 10*3/uL (ref 4.0–10.5)
WBC: 9.6 10*3/uL (ref 4.0–10.5)
nRBC: 0 % (ref 0.0–0.2)
nRBC: 0 % (ref 0.0–0.2)

## 2022-06-03 LAB — APTT: aPTT: 36 seconds (ref 24–36)

## 2022-06-03 LAB — RESP PANEL BY RT-PCR (RSV, FLU A&B, COVID)  RVPGX2
Influenza A by PCR: NEGATIVE
Influenza B by PCR: NEGATIVE
Resp Syncytial Virus by PCR: NEGATIVE
SARS Coronavirus 2 by RT PCR: POSITIVE — AB

## 2022-06-03 LAB — LACTIC ACID, PLASMA
Lactic Acid, Venous: 1.5 mmol/L (ref 0.5–1.9)
Lactic Acid, Venous: 1.5 mmol/L (ref 0.5–1.9)

## 2022-06-03 LAB — PHOSPHORUS: Phosphorus: 4.2 mg/dL (ref 2.5–4.6)

## 2022-06-03 LAB — FERRITIN: Ferritin: 48 ng/mL (ref 11–307)

## 2022-06-03 LAB — PROTIME-INR
INR: 1.7 — ABNORMAL HIGH (ref 0.8–1.2)
Prothrombin Time: 19.5 seconds — ABNORMAL HIGH (ref 11.4–15.2)

## 2022-06-03 LAB — MAGNESIUM
Magnesium: 1.9 mg/dL (ref 1.7–2.4)
Magnesium: 1.9 mg/dL (ref 1.7–2.4)

## 2022-06-03 LAB — PROCALCITONIN: Procalcitonin: <0.1 ng/mL

## 2022-06-03 LAB — LACTATE DEHYDROGENASE: LDH: 137 U/L (ref 98–192)

## 2022-06-03 LAB — C-REACTIVE PROTEIN: CRP: 1.9 mg/dL — ABNORMAL HIGH (ref <1.0)

## 2022-06-03 MED ORDER — POLYETHYLENE GLYCOL 3350 17 G PO PACK: 17.0000 g | PACK | Freq: Every day | ORAL | Status: DC | PRN

## 2022-06-03 MED ORDER — TIZANIDINE HCL 2 MG PO TABS
2.0000 mg | ORAL_TABLET | Freq: Every day | ORAL | Status: DC
  Administered 2022-06-03 – 2022-06-11 (×9): 2 mg via ORAL
  Filled 2022-06-03 (×9): qty 1

## 2022-06-03 MED ORDER — GABAPENTIN 400 MG PO CAPS
400.0000 mg | ORAL_CAPSULE | Freq: Every day | ORAL | Status: DC
  Administered 2022-06-03 – 2022-06-10 (×8): 400 mg via ORAL
  Filled 2022-06-03 (×8): qty 1

## 2022-06-03 MED ORDER — MOMETASONE FUROATE 0.1 % EX SOLN: 2.0000 [drp] | Freq: Every day | CUTANEOUS | Status: DC

## 2022-06-03 MED ORDER — GABAPENTIN 300 MG PO CAPS: 400.0000 mg | ORAL_CAPSULE | Freq: Two times a day (BID) | ORAL | Status: DC

## 2022-06-03 MED ORDER — ACYCLOVIR 200 MG PO CAPS
400.0000 mg | ORAL_CAPSULE | Freq: Two times a day (BID) | ORAL | Status: DC
  Administered 2022-06-03 – 2022-06-11 (×17): 400 mg via ORAL
  Filled 2022-06-03 (×18): qty 2

## 2022-06-03 MED ORDER — MAGNESIUM HYDROXIDE 400 MG/5ML PO SUSP: 30.0000 mL | Freq: Every day | ORAL | Status: DC | PRN

## 2022-06-03 MED ORDER — METHYLPREDNISOLONE SODIUM SUCC 125 MG IJ SOLR
1.0000 mg/kg | Freq: Once | INTRAMUSCULAR | Status: AC
  Administered 2022-06-03: 67.5 mg via INTRAVENOUS
  Filled 2022-06-03: qty 2

## 2022-06-03 MED ORDER — POTASSIUM CHLORIDE CRYS ER 20 MEQ PO TBCR
40.0000 meq | EXTENDED_RELEASE_TABLET | Freq: Once | ORAL | Status: AC
  Administered 2022-06-03: 40 meq via ORAL
  Filled 2022-06-03: qty 2

## 2022-06-03 MED ORDER — VITAMIN D (ERGOCALCIFEROL) 1.25 MG (50000 UNIT) PO CAPS
50000.0000 [IU] | ORAL_CAPSULE | ORAL | Status: DC
  Administered 2022-06-10: 50000 [IU] via ORAL
  Filled 2022-06-03 (×2): qty 1

## 2022-06-03 MED ORDER — TRAMADOL HCL 50 MG PO TABS
50.0000 mg | ORAL_TABLET | Freq: Every evening | ORAL | Status: DC | PRN
  Administered 2022-06-05 – 2022-06-06 (×2): 100 mg via ORAL
  Administered 2022-06-08 – 2022-06-10 (×3): 50 mg via ORAL
  Filled 2022-06-03: qty 2
  Filled 2022-06-03 (×2): qty 1
  Filled 2022-06-03: qty 2
  Filled 2022-06-03: qty 1

## 2022-06-03 MED ORDER — DOCUSATE SODIUM 100 MG PO CAPS
100.0000 mg | ORAL_CAPSULE | Freq: Every day | ORAL | Status: DC
  Administered 2022-06-03 – 2022-06-11 (×9): 100 mg via ORAL
  Filled 2022-06-03 (×9): qty 1

## 2022-06-03 MED ORDER — ALPHA-LIPOIC ACID 600 MG PO TABS: 600.0000 mg | ORAL_TABLET | Freq: Every day | ORAL | Status: DC

## 2022-06-03 MED ORDER — TRAZODONE HCL 50 MG PO TABS
25.0000 mg | ORAL_TABLET | Freq: Every evening | ORAL | Status: DC | PRN
  Administered 2022-06-04 – 2022-06-10 (×7): 25 mg via ORAL
  Filled 2022-06-03 (×7): qty 1

## 2022-06-03 MED ORDER — ACETAMINOPHEN 325 MG PO TABS
650.0000 mg | ORAL_TABLET | Freq: Four times a day (QID) | ORAL | Status: DC | PRN
  Filled 2022-06-03: qty 2

## 2022-06-03 MED ORDER — HYDROCOD POLI-CHLORPHE POLI ER 10-8 MG/5ML PO SUER
5.0000 mL | Freq: Two times a day (BID) | ORAL | Status: DC | PRN
  Administered 2022-06-07: 5 mL via ORAL
  Filled 2022-06-03: qty 5

## 2022-06-03 MED ORDER — SODIUM CHLORIDE 0.9 % IV BOLUS
500.0000 mL | Freq: Once | INTRAVENOUS | Status: AC
  Administered 2022-06-03: 500 mL via INTRAVENOUS

## 2022-06-03 MED ORDER — DULOXETINE HCL 30 MG PO CPEP
60.0000 mg | ORAL_CAPSULE | Freq: Every day | ORAL | Status: DC
  Administered 2022-06-03 – 2022-06-11 (×9): 60 mg via ORAL
  Filled 2022-06-03: qty 1
  Filled 2022-06-03 (×8): qty 2

## 2022-06-03 MED ORDER — HYDROCORTISONE (PERIANAL) 2.5 % EX CREA: 1.0000 | TOPICAL_CREAM | Freq: Every day | CUTANEOUS | Status: DC | PRN

## 2022-06-03 MED ORDER — APIXABAN 5 MG PO TABS
5.0000 mg | ORAL_TABLET | Freq: Two times a day (BID) | ORAL | Status: DC
  Administered 2022-06-03: 5 mg via ORAL
  Filled 2022-06-03: qty 1

## 2022-06-03 MED ORDER — PREDNISOLONE ACETATE 1 % OP SUSP
1.0000 [drp] | Freq: Every day | OPHTHALMIC | Status: DC
  Administered 2022-06-03 – 2022-06-11 (×9): 1 [drp] via OPHTHALMIC
  Filled 2022-06-03: qty 1
  Filled 2022-06-03: qty 5

## 2022-06-03 MED ORDER — DOXYCYCLINE MONOHYDRATE 100 MG PO CAPS: 100.0000 mg | ORAL_CAPSULE | Freq: Two times a day (BID) | ORAL | Status: DC

## 2022-06-03 MED ORDER — ATORVASTATIN CALCIUM 20 MG PO TABS: 20.0000 mg | ORAL_TABLET | Freq: Every day | ORAL | Status: DC

## 2022-06-03 MED ORDER — ROPINIROLE HCL 1 MG PO TABS
2.5000 mg | ORAL_TABLET | Freq: Every day | ORAL | Status: DC
  Administered 2022-06-03 – 2022-06-10 (×8): 2.5 mg via ORAL
  Filled 2022-06-03 (×8): qty 3

## 2022-06-03 MED ORDER — METHYLPREDNISOLONE SODIUM SUCC 125 MG IJ SOLR
1.0000 mg/kg | Freq: Two times a day (BID) | INTRAMUSCULAR | Status: AC
  Administered 2022-06-03 – 2022-06-05 (×5): 67.5 mg via INTRAVENOUS
  Filled 2022-06-03 (×5): qty 2

## 2022-06-03 MED ORDER — APIXABAN 2.5 MG PO TABS
2.5000 mg | ORAL_TABLET | Freq: Two times a day (BID) | ORAL | Status: DC
  Administered 2022-06-03 – 2022-06-06 (×6): 2.5 mg via ORAL
  Filled 2022-06-03 (×6): qty 1

## 2022-06-03 MED ORDER — POTASSIUM CHLORIDE 10 MEQ/100ML IV SOLN
10.0000 meq | INTRAVENOUS | Status: AC
  Administered 2022-06-03 (×2): 10 meq via INTRAVENOUS
  Filled 2022-06-03 (×2): qty 100

## 2022-06-03 MED ORDER — ONDANSETRON HCL 4 MG PO TABS: 4.0000 mg | ORAL_TABLET | Freq: Four times a day (QID) | ORAL | Status: DC | PRN

## 2022-06-03 MED ORDER — ZINC SULFATE 220 (50 ZN) MG PO CAPS
220.0000 mg | ORAL_CAPSULE | Freq: Every day | ORAL | Status: DC
  Administered 2022-06-03 – 2022-06-11 (×9): 220 mg via ORAL
  Filled 2022-06-03 (×9): qty 1

## 2022-06-03 MED ORDER — GABAPENTIN 400 MG PO CAPS: 400.0000 mg | ORAL_CAPSULE | Freq: Every day | ORAL | Status: DC | PRN

## 2022-06-03 MED ORDER — FAMOTIDINE 20 MG PO TABS
10.0000 mg | ORAL_TABLET | Freq: Two times a day (BID) | ORAL | Status: DC
  Administered 2022-06-03 – 2022-06-11 (×17): 10 mg via ORAL
  Filled 2022-06-03 (×17): qty 1

## 2022-06-03 MED ORDER — LISINOPRIL 20 MG PO TABS
20.0000 mg | ORAL_TABLET | Freq: Every day | ORAL | Status: DC
  Administered 2022-06-03: 20 mg via ORAL
  Filled 2022-06-03: qty 2

## 2022-06-03 MED ORDER — NIRMATRELVIR/RITONAVIR (PAXLOVID)TABLET: 3.0000 | ORAL_TABLET | Freq: Two times a day (BID) | ORAL | Status: DC

## 2022-06-03 MED ORDER — ONDANSETRON HCL 4 MG/2ML IJ SOLN: 4.0000 mg | Freq: Four times a day (QID) | INTRAMUSCULAR | Status: DC | PRN

## 2022-06-03 MED ORDER — PANTOPRAZOLE SODIUM 40 MG PO TBEC
40.0000 mg | DELAYED_RELEASE_TABLET | Freq: Every day | ORAL | Status: DC
  Administered 2022-06-03 – 2022-06-11 (×9): 40 mg via ORAL
  Filled 2022-06-03 (×9): qty 1

## 2022-06-03 MED ORDER — MEMANTINE HCL 5 MG PO TABS
10.0000 mg | ORAL_TABLET | Freq: Two times a day (BID) | ORAL | Status: DC
  Administered 2022-06-03 – 2022-06-11 (×17): 10 mg via ORAL
  Filled 2022-06-03 (×17): qty 2

## 2022-06-03 MED ORDER — PREDNISONE 50 MG PO TABS
50.0000 mg | ORAL_TABLET | Freq: Every day | ORAL | Status: DC
  Administered 2022-06-06: 50 mg via ORAL
  Filled 2022-06-03: qty 1

## 2022-06-03 MED ORDER — POTASSIUM CHLORIDE IN NACL 40-0.9 MEQ/L-% IV SOLN
INTRAVENOUS | Status: DC
  Filled 2022-06-03 (×2): qty 1000

## 2022-06-03 MED ORDER — GUAIFENESIN-DM 100-10 MG/5ML PO SYRP
10.0000 mL | ORAL_SOLUTION | ORAL | Status: DC | PRN
  Administered 2022-06-05: 10 mL via ORAL
  Filled 2022-06-03 (×2): qty 10

## 2022-06-03 MED ORDER — NIRMATRELVIR/RITONAVIR (PAXLOVID) TABLET (RENAL DOSING)
2.0000 | ORAL_TABLET | Freq: Two times a day (BID) | ORAL | Status: DC
  Administered 2022-06-03 – 2022-06-05 (×5): 2 via ORAL
  Filled 2022-06-03: qty 20

## 2022-06-03 MED ORDER — VITAMIN C 500 MG PO TABS
500.0000 mg | ORAL_TABLET | Freq: Every day | ORAL | Status: DC
  Administered 2022-06-03 – 2022-06-11 (×9): 500 mg via ORAL
  Filled 2022-06-03 (×9): qty 1

## 2022-06-03 MED ORDER — METOPROLOL SUCCINATE ER 100 MG PO TB24
100.0000 mg | ORAL_TABLET | Freq: Two times a day (BID) | ORAL | Status: DC
  Administered 2022-06-03 – 2022-06-11 (×17): 100 mg via ORAL
  Filled 2022-06-03 (×12): qty 1
  Filled 2022-06-03: qty 2
  Filled 2022-06-03 (×4): qty 1

## 2022-06-03 NOTE — Progress Notes (Signed)
Pt being followed by [facility] for Sepsis protocol. 

## 2022-06-03 NOTE — ED Notes (Signed)
Consulted Ovid Curd, Pharmacist about intervention for possible IV infiltration of potassium. No pharmaceutical interventions needed at the time. RN will place a warm compress on site.

## 2022-06-03 NOTE — Assessment & Plan Note (Signed)
-   We will continue his antihypertensives. 

## 2022-06-03 NOTE — Assessment & Plan Note (Signed)
-   We will continue statin therapy. 

## 2022-06-03 NOTE — Evaluation (Signed)
Physical Therapy Evaluation Patient Details Name: [name] MRN: [id number] DOB: December 09, 1929 Today's Date: 06/03/2022  History of Present Illness  presented to ER secondary to progressive weakness, inability to walk and fever; admitted for mangement of sepsis, acute respiratory failure with hypoxia related to COVID-19  Clinical Impression  Patient resting in bed upon arrival to session; alert and oriented to basic information, follows commands and agreeable to participation with session.  Denies pain.  Generally deconditioned due to acute illness, but bilat UE/LE strength and ROM grossly symmetrical and WFL for basic transfers and gait.  Able to complete bed mobility with mod indep; sit/stand, basic transfers and gait (24') with RW, cga/min assist.  Demonstrates partially reciprocal stepping pattern; slow and deliberate, often ceasing gait progression to participate with conversation.  Broad turning radius; increased time/effort for walker management. Sats >94% on RA with gait efforts Would benefit from skilled PT to address above deficits and promote optimal return to PLOF; Recommend transition to [place] upon discharge from acute hospitalization.      Recommendations for follow up therapy are one component of a multi-disciplinary discharge planning process, led by the attending physician.  Recommendations may be updated based on patient status, additional functional criteria and insurance authorization.  Follow Up Recommendations Home health PT      Assistance Recommended at Discharge Frequent or constant Supervision/Assistance  Patient can return home with the following  A little help with walking and/or transfers;A little help with bathing/dressing/bathroom    Equipment Recommendations    Recommendations for Other Services       Functional Status Assessment Patient has had a recent decline in their functional status and demonstrates the ability to make significant improvements in  function in a reasonable and predictable amount of time.     Precautions / Restrictions Precautions Precautions: Fall Restrictions Weight Bearing Restrictions: No      Mobility  Bed Mobility Overal bed mobility: Modified Independent                  Transfers Overall transfer level: Needs assistance Equipment used: Rolling walker (2 wheels) Transfers: Sit to/from Stand Sit to Stand: Min assist, Min guard                Ambulation/Gait Ambulation/Gait assistance: Min guard, Min assist Gait Distance (Feet): 24 Feet Assistive device: Rolling walker (2 wheels)         General Gait Details: partially reciprocal stepping pattern; slow and deliberate, often ceasing gait progression to participate with conversation.  Broad turning radius; increased time/effort for walker management. Sats >94% on RA with gait efforts  Stairs            Wheelchair Mobility    Modified Rankin (Stroke Patients Only)       Balance Overall balance assessment: Needs assistance Sitting-balance support: Feet supported, No upper extremity supported Sitting balance-Leahy Scale: Good     Standing balance support: Bilateral upper extremity supported Standing balance-Leahy Scale: Fair                               Pertinent Vitals/Pain Pain Assessment Pain Assessment: No/denies pain    Home Living Family/patient expects to be discharged to:: Private residence Living Arrangements: Alone Available Help at Discharge: Family;Personal care attendant;Available PRN/intermittently Type of Home: Apartment Home Access: Level entry       Home Layout: One level Home Equipment: Rollator (4 wheels)      Prior  Function Prior Level of Function : Independent/Modified Independent             Mobility Comments: Sup/mod indep for household mobilization with 4WRW; no home O2. ADLs Comments: Assist from PCA (9-1, M-F) for ADLs, household activities; received Meals on  Wheels for meal support; daughter stay with overnight for additional care needs     Hand Dominance   Dominant Hand: Right    Extremity/Trunk Assessment   Upper Extremity Assessment Upper Extremity Assessment: Generalized weakness    Lower Extremity Assessment Lower Extremity Assessment: Generalized weakness (grossly 4/5 throughout; no focal weakness appreciated)       Communication   Communication: HOH  Cognition Arousal/Alertness: Awake/alert Behavior During Therapy: WFL for tasks assessed/performed Overall Cognitive Status: Within Functional Limits for tasks assessed                                          General Comments      Exercises     Assessment/Plan    PT Assessment Patient needs continued PT services  PT Problem List Decreased activity tolerance;Decreased balance;Decreased mobility;Cardiopulmonary status limiting activity       PT Treatment Interventions DME instruction;Gait training;Therapeutic activities;Therapeutic exercise;Functional mobility training;Balance training;Patient/family education    PT Goals (Current goals can be found in the Care Plan section)  Acute Rehab PT Goals Patient Stated Goal: to get stronger and return home PT Goal Formulation: With patient Time For Goal Achievement: 06/17/22 Potential to Achieve Goals: Good    Frequency Min 2X/week     Co-evaluation               AM-PAC PT "6 Clicks" Mobility  Outcome Measure Help needed turning from your back to your side while in a flat bed without using bedrails?: None Help needed moving from lying on your back to sitting on the side of a flat bed without using bedrails?: None Help needed moving to and from a bed to a chair (including a wheelchair)?: A Little Help needed standing up from a chair using your arms (e.g., wheelchair or bedside chair)?: A Little Help needed to walk in hospital room?: A Little Help needed climbing 3-5 steps with a railing? : A  Little 6 Click Score: 20    End of Session Equipment Utilized During Treatment: Gait belt Activity Tolerance: Patient tolerated treatment well Patient left: in chair;with call bell/phone within reach;with chair alarm set Nurse Communication: Mobility status PT Visit Diagnosis: Muscle weakness (generalized) (M62.81);Difficulty in walking, not elsewhere classified (R26.2)    Time: [id number] PT Time Calculation (min) (ACUTE ONLY): 36 min   Charges:   PT Evaluation $PT Eval Moderate Complexity: 1 Mod PT Treatments $Therapeutic Activity: 8-22 mins        Jahvier Aldea H. Owens Shark, PT, DPT, NCS 06/03/22, 10:01 PM [phone]

## 2022-06-03 NOTE — Assessment & Plan Note (Addendum)
-   The patient has associated acute respiratory failure with hypoxia. - Sepsis manifested by fever, tachycardia and tachypnea. - She will be admitted to a medical telemetry bed. - We will continue hydration. - We will place her on p.o. Paxlovid given her age, history of coronary artery disease and acuity of her symptoms including profound weakness and hypoxia within less than 24 hours. - We will follow inflammatory markers. - She will be on vitamin C and zinc sulfate

## 2022-06-03 NOTE — Assessment & Plan Note (Signed)
-   We will continue Neurontin. ?

## 2022-06-03 NOTE — Assessment & Plan Note (Addendum)
-   This is clearly secondary to her COVID-19 infection as well as hypokalemia. - Management as above. - PT consult will be obtained later on.

## 2022-06-03 NOTE — Progress Notes (Signed)
  Progress Note   [name] [id number] DOB: [birth year] DOA: 06/02/2022     0 DOS: the patient was seen and examined on 06/03/2022   Brief hospital course: [name] is a 89 y.o. Caucasian female with medical history significant for hypertension, dyslipidemia, rheumatoid arthritis, RLS,, who presented to emergency room with acute onset of generalized weakness with inability to walk.  She had temperature 101.7, heart rate 94, she was hypoxemic with 89% on room air, was placed on 2 L oxygen.  Her blood pressure was also low, she received fluid bolus.  COVID came back positive.   Principal Problem:   Sepsis due to COVID-19 [facility]) Active Problems:   Hypokalemia   Generalized weakness   Essential hypertension   Peripheral neuropathy   Acute respiratory failure with hypoxia (HCC)   Dyslipidemia   GERD without esophagitis   Hypotension   Assessment and Plan:  * Sepsis due to COVID-19 [facility]) Acute hypoxemic respite failure secondary to COVID infection. Patient currently is hemodynamically stable, blood pressure is better.  I personally reviewed patient chest x-ray image, there is no pneumonia. No evidence of bacterial pneumonia. No need for antibiotics. Due to hypoxemia, continue Paxlovid as well as steroids.  Essential hypertension Hypotension. Patient received IV fluid bolus for hypotension, will discontinue lisinopril for now.  Hypokalemia Potassium had normalized after giving IV potassium and fluids.  Generalized weakness Pending PT eval.  GERD without esophagitis PPI.  Dyslipidemia - We will continue statin therapy.  Peripheral neuropathy - We will continue Neurontin.  .      Subjective:  Patient feel better today, on 2 L oxygen, denies any short of breath or cough.  No diarrhea.  Physical Exam: Vitals:   06/03/22 1010 06/03/22 1108 06/03/22 1130 06/03/22 1228  BP:  (!) 84/49 92/60 (!) 100/57  Pulse:  72 79 70  Resp:  (!) 22 (!) 25 19   Temp: (!) 96.1 F (35.6 C)   (!) 97.5 F (36.4 C)  TempSrc: Axillary   Oral  SpO2:  96% 92% 92%  Weight:      Height:       General exam: Appears calm and comfortable  Respiratory system: Clear to auscultation. Respiratory effort normal. Cardiovascular system: S1 & S2 heard, RRR. No JVD, murmurs, rubs, gallops or clicks. No pedal edema. Gastrointestinal system: Abdomen is nondistended, soft and nontender. No organomegaly or masses felt. Normal bowel sounds heard. Central nervous system: Alert and oriented. No focal neurological deficits. Extremities: Symmetric 5 x 5 power. Skin: No rashes, lesions or ulcers Psychiatry: Judgement and insight appear normal. Mood & affect appropriate.    Data Reviewed:  Reviewed chest x-ray, lab results.  Family Communication: Not able to reach daughters.   Disposition: Status is: Inpatient Remains inpatient appropriate because: Severity of disease, IV treatment.     Time spent: no charge  minutes  Author: Sharen Hones, MD 06/03/2022 1:43 PM  For on call review [email or web address].

## 2022-06-03 NOTE — H&P (Signed)
[place]   PATIENT NAME: [name]    MR#:  [id number]  DATE OF BIRTH:  1929-07-27  DATE OF ADMISSION:  06/02/2022  PRIMARY CARE PHYSICIAN: Crystal Bush, MD   Patient is coming from: Home  REQUESTING/REFERRING PHYSICIAN: Ward, Delice Bison, DO  CHIEF COMPLAINT:   Chief Complaint  Patient presents with   Weakness    HISTORY OF PRESENT ILLNESS:  [name] is a 89 y.o. Caucasian female with medical history significant for hypertension, dyslipidemia, rheumatoid arthritis, RLS,, who presented to emergency room with acute onset of generalized weakness with inability to walk.  She woke up yesterday morning with mild rhinorrhea and nasal congestion.  She did not have any nausea or vomiting or diarrhea.  No loss of taste or smell.  She developed low-grade fever of 99.7 by dinnertime and then started having worsening dyspnea with associated mild cough without chest pain or palpitations or wheezing.  She had diminished urine output throughout the day and has not been having any appetite for solid food or fluids.  She became significantly weak as mentioned above before coming to the ER.  She was noted to be hypoxic on room air.  ED Course: Upon arrival to the ER, temperature was 101.7 with BP 154/66, heart rate of 78 and later 94 respiratory rate of 28 and pulse 70 was initially 94 and later 89% on room air and 96% on 2 L of O2 via nasal cannula.  Labs revealed hypokalemia of 2.5 with otherwise unremarkable CMP.  High sensitive troponin I was 1214.  Lactic acid was 1.5 and procalcitonin less than 0.1.  CBC was within normal.  INR was 1.7 with PT of 19.7 and PTT 36.  COVID-19 PCR came back positive and influenza and RSV came back negative.  UA was unremarkable.  Blood cultures were drawn as well as urine culture. EKG as reviewed by me : Atrial flutter with predominantly 2-1 AV block, with a rate of 95, borderline low voltage QRS and repolarization abnormality and prolonged QT  interval with QTc of 516 MS. Imaging: Portable chest x-ray showed moderate hiatal hernia with no acute cardiopulmonary disease.  The patient was given broad-spectrum antibiotic therapy initially with Azactam, vancomycin and Flagyl as well as 1 L bolus of IV lactated Ringer followed by 150 mill per hour, 1 g of p.o. Tylenol and 40 mill equivalent p.o. potassium chloride.  She will be admitted to a medical telemetry bed for further evaluation and management. PAST MEDICAL HISTORY:   Past Medical History:  Diagnosis Date   Abdominal aortic atherosclerosis ([place]) 09/2015   by xray   Anxiety    BCC (basal cell carcinoma of skin) 05/2011   on nose   BCC (basal cell carcinoma of skin) 08/09/2020   upper chest, [facility] 12/27/20   Breast lesion    a. diagnosed with complex sclerosing lesion with calcifications of the right breast in 08/2016   Chest pain    a. nuclear stress test 03/2012: normal; b. 09/2018 MV: EF >65%, no isch/scar; c. 07/2019 Cath: LM nl, LAD 40ost, 39m LCX nl, RCA 50ost/p (iFR 0.98). EF 55-65% ->Med Rx.   DDD (degenerative disc disease), lumbosacral 12/06/1992   Gallstones 09/2015   incidentally by xray   GERD (gastroesophageal reflux disease) 05/1999   HERPES ZOSTER 04/13/2007   Qualifier: Diagnosis of  By: [name]   Hiatal hernia    History of shingles    ophthalmic, takes acyclovir daily  Hyperlipidemia 12/2003   Hypertension 1990   Mitral regurgitation    a. echo 03/2012: EF 60-65%, no RWMA, mild to mod AI/MR, mod TR, PASP 37 mmHg; b. 05/2019 Echo: EF 60-65%, mod MR.   Osteoarthritis 08/21/1989   Osteoporosis with fracture 11/1997   with compression fracture T12   PAD (peripheral artery disease) ([place])    a. 01/2020 PTA of R Peroneal and R PT; b. 09/2021 ABI/TBI: R 1.04/09.2, L 1.36/0.17.   Permanent atrial fibrillation ([place]) 03/16/2012   a. CHADS2VASc => 5 (HTN, age x 2, vascular disease, sex category)-->Eliquis 5 BID;  b. Recurrent AF in 06/2016;  c.  01/2017 s/p DCCV;  d. 02/2017 recurrent AFib->Amio added->DCCV; e. 03/2017 recurrent AFib, s/p reload of amio and DCCV; f. 12/2020 DCCV-->recurrent Afib, Amio d/c-->rate control.   POSTHERPETIC NEURALGIA 04/20/2007   Qualifier: Diagnosis of  By: [name]    Rheumatoid arthritis ([place])    RLS (restless legs syndrome)    SCC (squamous cell carcinoma) 05/01/2021   right posterior flank inferior to axilla - well differentiated squamous cell carcinoma with superificial infiltration, base involved. EDC 06/06/2021   Squamous cell carcinoma in situ 04/17/2022   Right dorsal hand, proximal. SCCis, inverted. Excised 05/08/2022   Squamous cell carcinoma of skin 12/19/2021   Proximal right dorsal hand. KA type. [facility] 12/19/2021   Squamous cell carcinoma of skin 12/19/2021   Distal right dorsal hand. KA type. [facility] 12/19/2021    PAST SURGICAL HISTORY:   Past Surgical History:  Procedure Laterality Date   ABDOMINAL HYSTERECTOMY  Age 59 - 79   S/P TAH   abdominal ultrasound  04/23/2004   Gallstones   BREAST EXCISIONAL BIOPSY Right 2018   neg/benign complex sclerosing lesion   BREAST LUMPECTOMY WITH RADIOACTIVE SEED LOCALIZATION Right 10/2016   breast lumpectomy with radioactive seed localization and margin assessment for complex sclerosing lesion Renelda Loma)   BREAST LUMPECTOMY WITH RADIOACTIVE SEED LOCALIZATION Right 11/05/2016   Procedure: RIGHT BREAST LUMPECTOMY WITH RADIOACTIVE SEED LOCALIZATION;  Surgeon: Fanny Skates, MD;  Location: [place];  Service: General;  Laterality: Right;   CARDIAC CATHETERIZATION     CARDIOVERSION N/A 01/31/2017   Procedure: CARDIOVERSION;  Surgeon: Wellington Hampshire, MD;  Location: [facility];  Service: Cardiovascular;  Laterality: N/A;   CARDIOVERSION N/A 03/03/2017   Procedure: CARDIOVERSION;  Surgeon: Wellington Hampshire, MD;  Location: [facility];  Service: Cardiovascular;  Laterality: N/A;   CARDIOVERSION N/A 04/02/2017   Procedure:  CARDIOVERSION;  Surgeon: Wellington Hampshire, MD;  Location: [facility];  Service: Cardiovascular;  Laterality: N/A;   CARDIOVERSION N/A 01/16/2018   Procedure: CARDIOVERSION;  Surgeon: Wellington Hampshire, MD;  Location: [facility];  Service: Cardiovascular;  Laterality: N/A;   CARDIOVERSION N/A 12/25/2020   Procedure: CARDIOVERSION;  Surgeon: Wellington Hampshire, MD;  Location: [facility];  Service: Cardiovascular;  Laterality: N/A;   CATARACT EXTRACTION  05/2010   left eye   CERVICAL DISCECTOMY  1992   Fusion (Dr. Joya Salm)   ERCP N/A 10/30/2020   Procedure: ENDOSCOPIC RETROGRADE CHOLANGIOPANCREATOGRAPHY (ERCP);  Surgeon: Jackquline Denmark, MD;  Location: [facility] ENDOSCOPY;  Service: Endoscopy;  Laterality: N/A;   ESI Bilateral 01/2016   S1 transforaminal ESI x3   ESOPHAGOGASTRODUODENOSCOPY  01/01/2002   with ulcer, bx. neg; + stricture gastric ulcer with hemorrhage   ESOPHAGOGASTRODUODENOSCOPY  04/23/2004   Esophageal stricture -- dilated   INTRAVASCULAR PRESSURE WIRE/FFR STUDY N/A 07/12/2019   Procedure: INTRAVASCULAR PRESSURE WIRE/FFR STUDY;  Surgeon: Wellington Hampshire,  MD;  Location: [place] CV LAB;  Service: Cardiovascular;  Laterality: N/A;   LOWER EXTREMITY ANGIOGRAPHY Left 02/01/2019   LOWER EXTREMITY ANGIOGRAPHY;  Surgeon: Algernon Huxley, MD   LOWER EXTREMITY ANGIOGRAPHY Right 03/25/2019   Procedure: LOWER EXTREMITY ANGIOGRAPHY;  Surgeon: Algernon Huxley, MD;  Location: [place] CV LAB;  Service: Cardiovascular;  Laterality: Right;   LOWER EXTREMITY ANGIOGRAPHY Right 08/02/2019   Procedure: LOWER EXTREMITY ANGIOGRAPHY;  Surgeon: Algernon Huxley, MD;  Location: [place] CV LAB;  Service: Cardiovascular;  Laterality: Right;   nuclear stress test  03/2012   no ischemia   PERIPHERAL VASCULAR BALLOON ANGIOPLASTY Left 01/2019   Percutaneous transluminal angioplasty of left anterior and posterior tibial artery with 2.5 mm diameter by 22 cm length angioplasty balloon ([place])   REMOVAL OF STONES  10/30/2020    Procedure: REMOVAL OF STONES;  Surgeon: Jackquline Denmark, MD;  Location: [facility] ENDOSCOPY;  Service: Endoscopy;;   RIGHT/LEFT HEART CATH AND CORONARY ANGIOGRAPHY Bilateral 07/12/2019   Procedure: RIGHT/LEFT HEART CATH AND CORONARY ANGIOGRAPHY;  Surgeon: Wellington Hampshire, MD;  Location: [place] CV LAB;  Service: Cardiovascular;  Laterality: Bilateral;   SKIN CANCER EXCISION  05/20/2011   BCC from nose   SPHINCTEROTOMY  10/30/2020   Procedure: SPHINCTEROTOMY;  Surgeon: Jackquline Denmark, MD;  Location: [facility] ENDOSCOPY;  Service: Endoscopy;;   US ECHOCARDIOGRAPHY  03/2012   in flutter, EF 60%, mild-mod aortic, mitral, tricuspid regurg, mildly dilated LA    SOCIAL HISTORY:   Social History   Tobacco Use   Smoking status: Former    Packs/day: 0.25    Years: 1.00    Total pack years: 0.25    Types: Cigarettes    Quit date: 04/08/1976    Years since quitting: 46.1   Smokeless tobacco: Never   Tobacco comments:    pt smokes occasionally "socially"  Substance Use Topics   Alcohol use: No    Alcohol/week: 1.0 standard drink of alcohol    Types: 1 Glasses of wine per week    FAMILY HISTORY:   Family History  Problem Relation Age of Onset   Emphysema Mother    Heart disease Father        MI   Stroke Father        first at age 25.   Stroke Sister    Diabetes Sister    Hypertension Brother    Heart disease Brother        Heart Valve   Stroke Brother    Diabetes Brother    Cancer Brother        esophageal   Diabetes Brother    Diabetes Brother    Colon cancer Neg Hx     DRUG ALLERGIES:   Allergies  Allergen Reactions   Penicillins Rash and Other (See Comments)    Childhood Allergy Has patient had a PCN reaction causing immediate rash, facial/tongue/throat swelling, SOB or lightheadedness with hypotension: Yes Has patient had a PCN reaction causing severe rash involving mucus membranes or skin necrosis: Unknown Has patient had a PCN reaction that required hospitalization: No Has  patient had a PCN reaction occurring within the last 10 years: No If all of the above answers are "NO", then may proceed with Cephalosporin use.     REVIEW OF SYSTEMS:   ROS As per history of present illness. All pertinent systems were reviewed above. Constitutional, HEENT, cardiovascular, respiratory, GI, GU, musculoskeletal, neuro, psychiatric, endocrine, integumentary and hematologic systems were reviewed and are otherwise  negative/unremarkable except for positive findings mentioned above in the HPI.   MEDICATIONS AT HOME:   Prior to Admission medications   Medication Sig Start Date End Date Taking? Authorizing Provider  acyclovir (ZOVIRAX) 400 MG tablet Take 400 mg by mouth 2 (two) times daily.   Yes [provider]  Alpha-Lipoic Acid 600 MG TABS Take 600 mg by mouth daily.   Yes [provider]  atorvastatin (LIPITOR) 20 MG tablet Take 1 tablet (20 mg total) by mouth daily. 09/12/21  Yes Crystal Bush, MD  Docusate Calcium (STOOL SOFTENER PO) Take 1 capsule by mouth daily.   Yes [provider]  DULoxetine (CYMBALTA) 60 MG capsule TAKE 1 CAPSULE BY MOUTH ONCE DAILY 01/28/22  Yes Crystal Bush, MD  ELIQUIS 5 MG TABS tablet TAKE 1 TABLET BY MOUTH TWICE DAILY 01/28/22  Yes Wellington Hampshire, MD  furosemide (LASIX) 20 MG tablet TAKE ONE TABLET BY MOUTH ONCE DAILY 05/15/22  Yes Wellington Hampshire, MD  gabapentin (NEURONTIN) 400 MG capsule TAKE 1 CAPSULE BY MOUTH AT BEDTIME AND SECOND DOSE DURING THE DAY AS NEEDED 09/13/21  Yes Crystal Bush, MD  lisinopril (ZESTRIL) 20 MG tablet TAKE ONE TABLET BY MOUTH ONCE DAILY 04/24/22  Yes Wellington Hampshire, MD  memantine (NAMENDA) 10 MG tablet Take 1 tablet (10 mg total) by mouth 2 (two) times daily. 04/14/22  Yes Crystal Bush, MD  metoprolol succinate (TOPROL-XL) 100 MG 24 hr tablet TAKE 1 TABLET BY MOUTH TWICE DAILY 03/18/22  Yes Wellington Hampshire, MD  mometasone (ELOCON) 0.1 % lotion 2-4 drops daily. 05/15/22  Yes  [provider]  pantoprazole (PROTONIX) 40 MG tablet Take 1 tablet (40 mg total) by mouth daily. 09/13/21  Yes Crystal Bush, MD  prednisoLONE acetate (PRED FORTE) 1 % ophthalmic suspension Place 1 drop into the left eye daily.    Yes [provider]  rOPINIRole (REQUIP) 1 MG tablet Take 2.5 tablets (2.5 mg total) by mouth at bedtime. 09/13/21  Yes Crystal Bush, MD  tiZANidine (ZANAFLEX) 2 MG tablet Take 2 mg by mouth daily.   Yes [provider]  Vitamin D, Ergocalciferol, (DRISDOL) 1.25 MG (50000 UNIT) CAPS capsule Take 1 capsule (50,000 Units total) by mouth every 7 (seven) days. 09/13/21  Yes Crystal Bush, MD  acetaminophen (TYLENOL) 500 MG tablet Take 500 mg by mouth every 6 (six) hours as needed for mild pain.    [provider]  doxycycline (MONODOX) 100 MG capsule Take 1 capsule (100 mg total) by mouth 2 (two) times daily. Take with food Patient not taking: Reported on 06/02/2022 05/08/22   Laurence Ferrari, [place], MD  hydrocortisone (ANUSOL-HC) 2.5 % rectal cream Place 1 application rectally daily as needed for hemorrhoids or anal itching. For hemmorhoid 05/30/21   Tower, Wynelle Fanny, MD  mupirocin ointment (BACTROBAN) 2 % Apply 1 Application topically daily. Patient not taking: Reported on 06/02/2022 05/08/22   Laurence Ferrari, [place], MD  polyethylene glycol (MIRALAX / GLYCOLAX) 17 g packet Take 17 g by mouth daily as needed for mild constipation. 10/27/19   Nolberto Hanlon, MD  traMADol (ULTRAM) 50 MG tablet Take 1-2 tablets (50-100 mg total) by mouth at bedtime as needed. 11/14/21   Crystal Bush, MD      VITAL SIGNS:  Blood pressure 127/80, pulse 76, temperature (!) 101.7 F (38.7 C), temperature source Rectal, resp. rate (!) 26, height '5\' 2"'$  (1.575 m), weight 67.6 kg, SpO2 93 %.  PHYSICAL EXAMINATION:  Physical Exam  GENERAL:  89  y.o.-year-old patient lying in the bed with no acute distress.  She was somnolent but easily arousable. EYES: Pupils equal, round,  reactive to light and accommodation. No scleral icterus. Extraocular muscles intact.  HEENT: Head atraumatic, normocephalic. Oropharynx and nasopharynx clear.  NECK:  Supple, no jugular venous distention. No thyroid enlargement, no tenderness.  LUNGS: Normal breath sounds bilaterally, no wheezing, rales,rhonchi or crepitation. No use of accessory muscles of respiration.  CARDIOVASCULAR: Regular rate and rhythm, S1, S2 normal. No murmurs, rubs, or gallops.  ABDOMEN: Soft, nondistended, nontender. Bowel sounds present. No organomegaly or mass.  EXTREMITIES: No pedal edema, cyanosis, or clubbing.  NEUROLOGIC: Cranial nerves II through XII are intact. Muscle strength 5/5 in all extremities. Sensation intact. Gait not checked.  PSYCHIATRIC: The patient is alert and oriented x 3.  Normal affect and good eye contact. SKIN: No obvious rash, lesion, or ulcer.   LABORATORY PANEL:   CBC Recent Labs  Lab 06/02/22 2353  WBC 9.6  HGB 13.7  HCT 40.8  PLT 240   ------------------------------------------------------------------------------------------------------------------  Chemistries  Recent Labs  Lab 06/02/22 2350 06/02/22 2353  NA  --  139  K  --  2.5*  CL  --  96*  CO2  --  29  GLUCOSE  --  114*  BUN  --  11  CREATININE  --  0.98  CALCIUM  --  9.2  MG 1.9  --   AST  --  19  ALT  --  14  ALKPHOS  --  63  BILITOT  --  1.0   ------------------------------------------------------------------------------------------------------------------  Cardiac Enzymes No results for input(s): "TROPONINI" in the last 168 hours. ------------------------------------------------------------------------------------------------------------------  RADIOLOGY:  DG Chest Port 1 View  Result Date: 06/03/2022 CLINICAL DATA:  Sepsis EXAM: PORTABLE CHEST 1 VIEW COMPARISON:  05/30/2021 FINDINGS: The lungs are symmetrically well expanded. Right cardiophrenic angle opacity represents a moderate hiatal hernia  better seen on CT examination of 07/06/2019. Lungs are otherwise clear. No pneumothorax or pleural effusion. Cardiac size is at the upper limits of normal, likely accentuated by semi-erect positioning. Pulmonary vascularity is normal. No acute bone abnormality. IMPRESSION: 1. No active disease. 2. Moderate hiatal hernia. Electronically Signed   By: Fidela Salisbury M.D.   On: 06/03/2022 00:04      IMPRESSION AND PLAN:  Assessment and Plan: * Sepsis due to COVID-19 [facility]) - The patient has associated acute respiratory failure with hypoxia. - Sepsis manifested by fever, tachycardia and tachypnea. - She will be admitted to a medical telemetry bed. - We will continue hydration. - We will place her on p.o. Paxlovid given her age, history of coronary artery disease and acuity of her symptoms including profound weakness and hypoxia within less than 24 hours. - We will follow inflammatory markers. - She will be on vitamin C and zinc sulfate  Hypokalemia - Potassium will be replaced and followed.  Generalized weakness - This is clearly secondary to her COVID-19 infection as well as hypokalemia. - Management as above. - PT consult will be obtained later on.  GERD without esophagitis - We will continue PPI therapy.  Dyslipidemia - We will continue statin therapy.  Peripheral neuropathy - We will continue Neurontin.  Essential hypertension - We will continue his antihypertensives.   DVT prophylaxis: Lovenox.  Advanced Care Planning:  Code Status: The patient is DNR and DNI.  This was discussed with her and her daughter. Family Communication:  The plan of care was discussed in details with the patient (and family).  I answered all questions. The patient agreed to proceed with the above mentioned plan. Further management will depend upon hospital course. Disposition Plan: Back to previous home environment Consults called: none.  All the records are reviewed and case discussed with ED  provider.  Status is: Inpatient   At the time of the admission, it appears that the appropriate admission status for this patient is inpatient.  This is judged to be reasonable and necessary in order to provide the required intensity of service to ensure the patient's safety given the presenting symptoms, physical exam findings and initial radiographic and laboratory data in the context of comorbid conditions.  The patient requires inpatient status due to high intensity of service, high risk of further deterioration and high frequency of surveillance required.  I certify that at the time of admission, it is my clinical judgment that the patient will require inpatient hospital care extending more than 2 midnights.                            Dispo: The patient is from: Home              Anticipated d/c is to: Home              Patient currently is not medically stable to d/c.              Difficult to place patient: No  Christel Mormon M.D on 06/03/2022 at 5:13 AM  [facility]   From 7 PM-7 AM, contact night-coverage [email or web address]  CC: Primary care physician; Crystal Bush, MD

## 2022-06-03 NOTE — Hospital Course (Signed)
[name] is a 89 y.o. Caucasian female with medical history significant for hypertension, dyslipidemia, rheumatoid arthritis, RLS,, who presented to emergency room with acute onset of generalized weakness with inability to walk.  She had temperature 101.7, heart rate 94, she was hypoxemic with 89% on room air, was placed on 2 L oxygen.  Her blood pressure was also low, she received fluid bolus.  COVID came back positive.

## 2022-06-03 NOTE — Assessment & Plan Note (Signed)
-   We will continue PPI therapy. 

## 2022-06-03 NOTE — Progress Notes (Signed)
BRUISING NOTED TO L ARM

## 2022-06-03 NOTE — Progress Notes (Signed)
   06/03/22 0900  Spiritual Encounters  Type of Visit Initial  Care provided to: Pt and family  Referral source Code page  Reason for visit Routine spiritual support  OnCall Visit Yes   Chaplain responded to nurse consult. Chaplain provided compassionate presence and reflective listening as patient and family spoke about health challenges. Chaplain also provided education regarding advance directives and Chaplain services. Patient and family appreciated Chaplain visit.

## 2022-06-03 NOTE — Progress Notes (Signed)
PHARMACIST - PHYSICIAN ORDER COMMUNICATION  CONCERNING: P&T Medication Policy on Herbal Medications  DESCRIPTION:  This patient's order(s) for:  Alpha-Lipoic Acid 600 MG TABS  has been noted.  This product(s) is classified as an "herbal" or natural product. Due to a lack of definitive safety studies or FDA approval, nonstandard manufacturing practices, plus the potential risk of unknown drug-drug interactions while on inpatient medications, the Pharmacy and Therapeutics Committee does not permit the use of "herbal" or natural products of this type within [facility].   ACTION TAKEN: The pharmacy department is unable to verify this order at this time.  Please reevaluate patient's clinical condition at discharge and address if the herbal or natural product(s) should be resumed at that time.  Renda Rolls, PharmD, [facility] 06/03/2022 2:43 AM

## 2022-06-03 NOTE — Assessment & Plan Note (Signed)
-   Potassium will be replaced and followed.

## 2022-06-04 ENCOUNTER — Encounter (INDEPENDENT_AMBULATORY_CARE_PROVIDER_SITE_OTHER): Payer: Medicare Other

## 2022-06-04 ENCOUNTER — Ambulatory Visit (INDEPENDENT_AMBULATORY_CARE_PROVIDER_SITE_OTHER): Payer: Medicare Other | Admitting: Vascular Surgery

## 2022-06-04 ENCOUNTER — Inpatient Hospital Stay: Payer: Medicare Other

## 2022-06-04 DIAGNOSIS — I071 Rheumatic tricuspid insufficiency: Secondary | ICD-10-CM | POA: Insufficient documentation

## 2022-06-04 DIAGNOSIS — A4189 Other specified sepsis: Secondary | ICD-10-CM | POA: Diagnosis not present

## 2022-06-04 DIAGNOSIS — J810 Acute pulmonary edema: Secondary | ICD-10-CM | POA: Diagnosis not present

## 2022-06-04 DIAGNOSIS — I5033 Acute on chronic diastolic (congestive) heart failure: Secondary | ICD-10-CM

## 2022-06-04 DIAGNOSIS — I4891 Unspecified atrial fibrillation: Secondary | ICD-10-CM

## 2022-06-04 DIAGNOSIS — J9601 Acute respiratory failure with hypoxia: Secondary | ICD-10-CM | POA: Diagnosis not present

## 2022-06-04 DIAGNOSIS — U071 COVID-19: Secondary | ICD-10-CM | POA: Diagnosis not present

## 2022-06-04 LAB — GLUCOSE, CAPILLARY
Glucose-Capillary: 149 mg/dL — ABNORMAL HIGH (ref 70–99)
Glucose-Capillary: 174 mg/dL — ABNORMAL HIGH (ref 70–99)
Glucose-Capillary: 217 mg/dL — ABNORMAL HIGH (ref 70–99)
Glucose-Capillary: 266 mg/dL — ABNORMAL HIGH (ref 70–99)

## 2022-06-04 LAB — CBC WITH DIFFERENTIAL/PLATELET
Abs Immature Granulocytes: 0.09 10*3/uL — ABNORMAL HIGH (ref 0.00–0.07)
Basophils Absolute: 0 10*3/uL (ref 0.0–0.1)
Basophils Relative: 0 %
Eosinophils Absolute: 0.1 10*3/uL (ref 0.0–0.5)
Eosinophils Relative: 1 %
HCT: 39.5 % (ref 36.0–46.0)
Hemoglobin: 13.1 g/dL (ref 12.0–15.0)
Immature Granulocytes: 1 %
Lymphocytes Relative: 5 %
Lymphs Abs: 0.8 10*3/uL (ref 0.7–4.0)
MCH: 31.6 pg (ref 26.0–34.0)
MCHC: 33.2 g/dL (ref 30.0–36.0)
MCV: 95.2 fL (ref 80.0–100.0)
Monocytes Absolute: 0.6 10*3/uL (ref 0.1–1.0)
Monocytes Relative: 4 %
Neutro Abs: 13.7 10*3/uL — ABNORMAL HIGH (ref 1.7–7.7)
Neutrophils Relative %: 89 %
Platelets: 222 10*3/uL (ref 150–400)
RBC: 4.15 MIL/uL (ref 3.87–5.11)
RDW: 13.6 % (ref 11.5–15.5)
WBC: 15.3 10*3/uL — ABNORMAL HIGH (ref 4.0–10.5)
nRBC: 0 % (ref 0.0–0.2)

## 2022-06-04 LAB — BLOOD GAS, VENOUS
Acid-Base Excess: 5 mmol/L — ABNORMAL HIGH (ref 0.0–2.0)
Acid-base deficit: 14.6 mmol/L — ABNORMAL HIGH (ref 0.0–2.0)
Bicarbonate: 15.4 mmol/L — ABNORMAL LOW (ref 20.0–28.0)
Bicarbonate: 29.9 mmol/L — ABNORMAL HIGH (ref 20.0–28.0)
O2 Saturation: 86.5 %
O2 Saturation: 92.5 %
Patient temperature: 37
Patient temperature: 37
pCO2, Ven: 52 mmHg (ref 44–60)
pCO2, Ven: 44 mmHg (ref 44–60)
pH, Ven: 7.08 — CL (ref 7.25–7.43)
pH, Ven: 7.44 — ABNORMAL HIGH (ref 7.25–7.43)
pO2, Ven: 63 mmHg — ABNORMAL HIGH (ref 32–45)
pO2, Ven: 61 mmHg — ABNORMAL HIGH (ref 32–45)

## 2022-06-04 LAB — D-DIMER, QUANTITATIVE
D-Dimer, Quant: 1.52 ug/mL-FEU — ABNORMAL HIGH (ref 0.00–0.50)
D-Dimer, Quant: 1.63 ug/mL-FEU — ABNORMAL HIGH (ref 0.00–0.50)

## 2022-06-04 LAB — MAGNESIUM: Magnesium: 1.8 mg/dL (ref 1.7–2.4)

## 2022-06-04 LAB — PROCALCITONIN: Procalcitonin: <0.1 ng/mL

## 2022-06-04 LAB — URINE CULTURE: Culture: NO GROWTH

## 2022-06-04 LAB — LACTIC ACID, PLASMA: Lactic Acid, Venous: 2.7 mmol/L (ref 0.5–1.9)

## 2022-06-04 LAB — COMPREHENSIVE METABOLIC PANEL
ALT: 31 U/L (ref 0–44)
AST: 46 U/L — ABNORMAL HIGH (ref 15–41)
Albumin: 3.1 g/dL — ABNORMAL LOW (ref 3.5–5.0)
Alkaline Phosphatase: 69 U/L (ref 38–126)
Anion gap: 12 (ref 5–15)
BUN: 16 mg/dL (ref 8–23)
CO2: 27 mmol/L (ref 22–32)
Calcium: 8.7 mg/dL — ABNORMAL LOW (ref 8.9–10.3)
Chloride: 104 mmol/L (ref 98–111)
Creatinine, Ser: 0.95 mg/dL (ref 0.44–1.00)
GFR, Estimated: 57 mL/min — ABNORMAL LOW (ref >60)
Glucose, Bld: 182 mg/dL — ABNORMAL HIGH (ref 70–99)
Potassium: 3.4 mmol/L — ABNORMAL LOW (ref 3.5–5.1)
Sodium: 143 mmol/L (ref 135–145)
Total Bilirubin: 0.6 mg/dL (ref 0.3–1.2)
Total Protein: 6.2 g/dL — ABNORMAL LOW (ref 6.5–8.1)

## 2022-06-04 LAB — BRAIN NATRIURETIC PEPTIDE: B Natriuretic Peptide: 710.6 pg/mL — ABNORMAL HIGH (ref 0.0–100.0)

## 2022-06-04 LAB — FERRITIN: Ferritin: 98 ng/mL (ref 11–307)

## 2022-06-04 LAB — C-REACTIVE PROTEIN: CRP: 2 mg/dL — ABNORMAL HIGH (ref <1.0)

## 2022-06-04 MED ORDER — FUROSEMIDE 10 MG/ML IJ SOLN
40.0000 mg | Freq: Two times a day (BID) | INTRAMUSCULAR | Status: DC
  Administered 2022-06-04 – 2022-06-07 (×7): 40 mg via INTRAVENOUS
  Filled 2022-06-04 (×7): qty 4

## 2022-06-04 MED ORDER — MORPHINE SULFATE (PF) 2 MG/ML IV SOLN: 0.5000 mg | Freq: Once | INTRAVENOUS | Status: DC

## 2022-06-04 MED ORDER — SODIUM BICARBONATE 8.4 % IV SOLN
50.0000 meq | Freq: Once | INTRAVENOUS | Status: AC
  Administered 2022-06-04: 50 meq via INTRAVENOUS
  Filled 2022-06-04: qty 50

## 2022-06-04 MED ORDER — INSULIN ASPART 100 UNIT/ML IJ SOLN
0.0000 [IU] | Freq: Every day | INTRAMUSCULAR | Status: DC
  Administered 2022-06-05: 2 [IU] via SUBCUTANEOUS
  Administered 2022-06-08: 3 [IU] via SUBCUTANEOUS
  Filled 2022-06-04: qty 1

## 2022-06-04 MED ORDER — CLONAZEPAM 0.25 MG PO TBDP: 0.2500 mg | ORAL_TABLET | Freq: Once | ORAL | Status: DC

## 2022-06-04 MED ORDER — METOPROLOL TARTRATE 5 MG/5ML IV SOLN: 5.0000 mg | Freq: Once | INTRAVENOUS | Status: DC

## 2022-06-04 MED ORDER — POTASSIUM CHLORIDE 20 MEQ PO PACK
40.0000 meq | PACK | Freq: Once | ORAL | Status: AC
  Administered 2022-06-04: 40 meq via ORAL
  Filled 2022-06-04: qty 2

## 2022-06-04 MED ORDER — FUROSEMIDE 10 MG/ML IJ SOLN
INTRAMUSCULAR | Status: AC
  Administered 2022-06-04: 20 mg via INTRAVENOUS
  Filled 2022-06-04: qty 4

## 2022-06-04 MED ORDER — FUROSEMIDE 10 MG/ML IJ SOLN: 20.0000 mg | Freq: Once | INTRAMUSCULAR | Status: AC

## 2022-06-04 MED ORDER — METOPROLOL TARTRATE 5 MG/5ML IV SOLN: 5.0000 mg | Freq: Once | INTRAVENOUS | Status: DC | PRN

## 2022-06-04 MED ORDER — INSULIN ASPART 100 UNIT/ML IJ SOLN
0.0000 [IU] | Freq: Three times a day (TID) | INTRAMUSCULAR | Status: DC
  Administered 2022-06-04: 2 [IU] via SUBCUTANEOUS
  Administered 2022-06-05: 3 [IU] via SUBCUTANEOUS
  Administered 2022-06-05: 2 [IU] via SUBCUTANEOUS
  Administered 2022-06-06: 3 [IU] via SUBCUTANEOUS
  Administered 2022-06-06: 1 [IU] via SUBCUTANEOUS
  Administered 2022-06-06: 2 [IU] via SUBCUTANEOUS
  Administered 2022-06-07 (×2): 1 [IU] via SUBCUTANEOUS
  Administered 2022-06-07: 5 [IU] via SUBCUTANEOUS
  Administered 2022-06-08: 2 [IU] via SUBCUTANEOUS
  Administered 2022-06-08: 1 [IU] via SUBCUTANEOUS
  Filled 2022-06-04 (×11): qty 1

## 2022-06-04 MED ORDER — POTASSIUM CHLORIDE 10 MEQ/100ML IV SOLN
10.0000 meq | Freq: Once | INTRAVENOUS | Status: AC
  Administered 2022-06-04: 10 meq via INTRAVENOUS
  Filled 2022-06-04: qty 100

## 2022-06-04 NOTE — Progress Notes (Signed)
Patient's lactic acid is 2.7, infromed to Dr. Roosevelt Locks, [facility], he said will order something if needed.

## 2022-06-04 NOTE — Progress Notes (Addendum)
0030- Called into pt room for c/o SOB, on assessment pt has audible congestion, respiratory rate 28, o2 sats 74% after replacing o2 at 2l, telemetry calling for heart rate 130-140's. Rapid response called , Dr. Otilio Miu and [facility] on unit to evaluate pt. Orders given and initiated, pt transferred to room 235 on BIPAP, report given to [facility]. Daughter [name] called with no answer, message left on voicemail.

## 2022-06-04 NOTE — Inpatient Diabetes Management (Signed)
Inpatient Diabetes Program Recommendations  AACE/ADA: New Consensus Statement on Inpatient Glycemic Control  Target Ranges:  Prepandial:   less than 140 mg/dL      Peak postprandial:   less than 180 mg/dL (1-2 hours)      Critically ill patients:  140 - 180 mg/dL    Latest Reference Range & Units 06/04/22 00:44  Glucose-Capillary 70 - 99 mg/dL 266 (H)    Latest Reference Range & Units 06/02/22 23:53 06/03/22 08:18 06/04/22 06:52  Glucose 70 - 99 mg/dL 114 (H) 127 (H) 182 (H)   Review of Glycemic Control  Diabetes history: No Outpatient Diabetes medications: NA Current orders for Inpatient glycemic control: None; Solumedrol 67.5 mg Q12H; changing to Prednisone 50 mg daily on 2/29  Inpatient Diabetes Program Recommendations:    Insulin: While ordered steroids, please consider ordering Novolog 0-9 units TID with meals and Novolog 0-5 units QHS.  Thanks, Barnie Alderman, RN, MSN, [place] Diabetes Coordinator Inpatient Diabetes Program [phone] (Team Pager from 8am to [place])

## 2022-06-04 NOTE — Progress Notes (Signed)
       CROSS COVER NOTE  NAME: KYMARI BUCKSON MRN: [id number] DOB : 02/20/1930 ATTENDING PHYSICIAN: Christel Mormon, MD    Date of Service   06/04/2022   HPI/Events of Note   Rapid Response called on M(r)s [facility] for tachypnea, tachycardia, hypoxia, dyspnea, and desaturation to 74% now on NRB.  On bedside evaluation M(r)s Semler is drowsy and oriented x4. She is able to follow commands and protect her airway. Breath sounds are rhonchorous with crackles in bilateral bases. S1, S2 heart tones heard with no murmurs, rubs, or gallops. She has +2 radial and +1 pedal pulses. Extremities are warm to the touch. Patient placed on bipap with low dose klonopin for bipap anxiety, blood gas ordered, and CXR ordered.  M(r)s Kono is a 89 yo F with PMH of HTN, HLD, RA, RLS, CAD and chronic diastolic heart failure. She presented to [facility] ED with generalized weakness and inability to walk. She was found to be COVID + and was started on Paxlovid.    Dr Sidney Ace later presented to bedside and recommended a D-Dimer and one time morphine dose.  Interventions   Assessment/Plan:  Acute Hypoxic Respiratory Failure secondary to COVID and CHF Bipap 20 mg IV Lasix Morphine 0.5 mg VBG--> 7.08/52/63/15.4   Procalcitonin -- <0.10 CXR --Asymmetric pulmonary infiltrate more sever on (R) side. Infection vs Edema D-Dimer -->1.52; CTA PE pending, will transport when stable  Metabolic Acidosis 2 amps bicarb  Atrial Fibrillation - HR 132  RVR resolved spontaneously before IV Metoprolol given       To reach the provider On-Call:   7AM- 7PM see care teams to locate the attending and reach out to them via [email or web address] Password: [email or web address] 7PM-7AM contact night-coverage If you still have difficulty reaching the appropriate provider, please page the [facility] (Director on Call) for [facility] on amion for assistance  This document was prepared using Systems analyst and may include unintentional  dictation errors.  Neomia Glass DNP, MBA, FNP-BC, PMHNP-BC Nurse Practitioner [facility] Pager [phone]

## 2022-06-04 NOTE — Progress Notes (Signed)
[facility]) Hospital Liaison Note  Notified by Margarite Gouge of patient/family request for [facility] Palliative services at home after discharge.   [place] liaison will follow patient for discharge disposition.   Please call with any Hospice/Palliative related questions or concerns.   Thank you for the opportunity to participate in this patient's care  Jhonnie Garner RN, [facility] [phone]

## 2022-06-04 NOTE — Progress Notes (Addendum)
Critical care note:  Date of note: 06/04/2022  Subjective: Rapid response was called as the patient was having worsening respiratory distress with increased work of breathing in the setting.  No reported chest pain.  She was tachycardic and tachypneic with diffusely coarse breath sounds.  No reported fever or chills.  No reported nausea or vomiting.  Objective: Physical examination: Generally: Acutely ill elderly Caucasian female in moderate respiratory distress on Ventimask and later placed on BiPAP. Vital signs: BP 166/109, heart rate 130, respiratory rate 28 and pulse oximetry was 74% on 2 L of O2 nasal cannula on the temperature 97.7.  On BiPAP oximetry was up to 100% on initially 100% FiO2 and later 60%. Head - atraumatic, normocephalic.  Pupils - equal, round and reactive to light and accommodation. Extraocular movements are intact. No scleral icterus.  Oropharynx - moist mucous membranes and tongue. No pharyngeal erythema or exudate.  Neck - supple. No JVD. Carotid pulses 2+ bilaterally. No carotid bruits. No palpable thyromegaly or lymphadenopathy. Cardiovascular - regular rate and rhythm. Normal S1 and S2. No murmurs, gallops or rubs.  Lungs -diffusely coarse breath sounds with bibasal and midlung zone crackles. Abdomen - soft and nontender. Positive bowel sounds. No palpable organomegaly or masses.  Extremities - no pitting edema, clubbing or cyanosis.  Neuro - grossly non-focal. Skin - no rashes. Breast, pelvic and rectal - deferred.  Labs and notes were reviewed. - Stat portable chest x-ray was reviewed by myself and it revealed diffuse bilateral infiltrates concerning for interstitial pulmonary edema.  I cannot rule out multifocal/atypical pneumonia developing specially given her COVID-19 infection.  Assessment/plan: 1.  Acute history failure with hypoxia, secondary to interstitial pulmonary edema with inability to rule out multifocal pneumonia it could be due to COVID-19 with  associated sepsis. - The patient was placed on BiPAP with clinical improvement. - O2 protocol will be followed. - She was given 40 mg of IV Lasix. - She will be given IV morphine sulfate. - ABG was ordered and is currently pending. - D-dimer was added and is currently pending. - We will continue mucolytic therapy and bronchodilator therapy with DuoNebs 4 times daily and every 4 hours as needed. - I still doubt bacterial infection in her case as her procalcitonin was less than 0.1 yesterday with a lactic acid of 1.5. - We will continue to closely monitor her tonight. - We will continue other current plan of care.  Authorized and performed by: Eugenie Norrie, MD Total critical care time: Approximately   30    minutes. Due to a high probability of clinically significant, life-threatening deterioration, the patient required my highest level of preparedness to intervene emergently and I personally spent this critical care time directly and personally managing the patient.  This critical care time included obtaining a history, examining the patient, pulse oximetry, ordering and review of studies, arranging urgent treatment with development of management plan, evaluation of patient's response to treatment, frequent reassessment, and discussions with other providers. This critical care time was performed to assess and manage the high probability of imminent, life-threatening deterioration that could result in multiorgan failure.  It was exclusive of separately billable procedures and treating other patients and teaching time.

## 2022-06-04 NOTE — Progress Notes (Addendum)
Progress Note   [name] [id number] DOB: 06-24-1929 DOA: 06/02/2022     1 DOS: the patient was seen and examined on 06/04/2022   Brief hospital course: DELMA COCCO is a 89 y.o. Caucasian female with medical history significant for hypertension, dyslipidemia, rheumatoid arthritis, RLS,, who presented to emergency room with acute onset of generalized weakness with inability to walk.  She had temperature 101.7, heart rate 94, she was hypoxemic with 89% on room air, was placed on 2 L oxygen.  Her blood pressure was also low, she received fluid bolus.  COVID came back positive.  Patient treated with steroids and Paxlovid. Patient developed significant respiratory distress early morning on 2/27, diagnosed with acute on chronic diastolic congestive heart failure, started on IV Lasix.    Principal Problem:   Sepsis due to COVID-19 [facility]) Active Problems:   Hypokalemia   Generalized weakness   Essential hypertension   Peripheral neuropathy   Acute respiratory failure with hypoxia (HCC)   Acute on chronic diastolic CHF (congestive heart failure) (HCC)   Dyslipidemia   GERD without esophagitis   Hypotension   Moderate tricuspid regurgitation   Assessment and Plan: * Sepsis due to COVID-19 [facility]) Acute hypoxemic respite failure secondary to COVID infection. Diastolic congestive heart failure Moderate tricuspid regurgitation. Treated with Paxlovid and IV steroids.  She developed volume overload with severe respiratory distress on the morning of 2/27, chest x-ray showed evidence of volume overload.  BNP elevated.  She received IV Lasix last night, patient will continue IV Lasix. Reviewed echocardiogram performed in 04/2022, ejection fraction 50 to 55%, moderate tricuspid regurgitation, diastolic function indeterminant. Continue IV Lasix at 40 mg twice a day. Due to clear evidence of congestive heart failure, I do not suspect a PE, no need for CT scan of the chest.  Patient  also chronically on Eliquis.   Essential hypertension Hypotension. Patient received IV fluid bolus for hypotension.  Blood pressure is more stable.  Discontinued lisinopril due to hypotension.   Hypokalemia Potassium 3.4, will give 10 mEq KCl IV and 40 mEq oral.   Generalized weakness Pending PT eval.   GERD without esophagitis PPI.   Dyslipidemia - We will continue statin therapy.   Peripheral neuropathy  We will continue Neurontin.      Subjective:  Patient had a worsening shortness of breath last night, she has no cough.  She feels better this morning, still requiring oxygen.  Physical Exam: Vitals:   06/04/22 0857 06/04/22 0858 06/04/22 0907 06/04/22 1016  BP:  (!) 175/99    Pulse:  90    Resp:  20    Temp:  98.6 F (37 C)    TempSrc:  Oral    SpO2: (!) 87% (!) 87% (!) 89% 94%  Weight:      Height:       General exam: Appears calm and comfortable  Respiratory system: Coarse breath sounds with crackles in the bases bilaterally. Respiratory effort normal. Cardiovascular system: S1 & S2 heard, RRR. No JVD, murmurs, rubs, gallops or clicks. No pedal edema. Gastrointestinal system: Abdomen is nondistended, soft and nontender. No organomegaly or masses felt. Normal bowel sounds heard. Central nervous system: Alert and oriented x3. No focal neurological deficits. Extremities: Symmetric 5 x 5 power. Skin: No rashes, lesions or ulcers Psychiatry: Mood & affect appropriate.    Data Reviewed:  There are no new results to review at this time.  Family Communication: Daughter updated, all questions answered.  Disposition: Status is:  Inpatient Remains inpatient appropriate because:, Severity of disease, IV treatment. Patient has been seen by PT/OT, recommend home health.  However, due to worsening respiratory status, will need to reeval to decide if patient still appropriate for home health.     Time spent: 55 minutes  Author: Sharen Hones, MD 06/04/2022 11:07  AM  For on call review [email or web address].

## 2022-06-04 NOTE — TOC Initial Note (Signed)
Transition of Care [facility]) - Initial/Assessment Note    Patient Details  Name: [name] MRN: [id number] Date of Birth: [birth year]  Transition of Care [facility]) CM/SW Contact:    Laurena Slimmer, RN Phone Number: 06/04/2022, 11:30 AM  Clinical Narrative:                 Spoke with patient's daughter, [name] regarding discharge plan. Patient daughter is agreeable to [facility] via [place]. She is also requesting palliative care via [facility].  Referral sent to  [facility].  Referral accepted by [facility].        Patient Goals and CMS Choice            Expected Discharge Plan and Services                                              Prior Living Arrangements/Services                       Activities of Daily Living Home Assistive Devices/Equipment: None ADL Screening (condition at time of admission) Patient's cognitive ability adequate to safely complete daily activities?: Yes Is the patient deaf or have difficulty hearing?: No Does the patient have difficulty seeing, even when wearing glasses/contacts?: Yes Does the patient have difficulty concentrating, remembering, or making decisions?: No Patient able to express need for assistance with ADLs?: Yes Does the patient have difficulty dressing or bathing?: Yes Independently performs ADLs?: No Communication: Independent Dressing (OT): Needs assistance (WEAK R/T COVID) Is this a change from baseline?: Change from baseline, expected to last <3days Grooming: Needs assistance Is this a change from baseline?: Change from baseline, expected to last <3 days Feeding: Independent Bathing: Needs assistance Is this a change from baseline?: Change from baseline, expected to last <3 days Toileting: Needs assistance Is this a change from baseline?: Change from baseline, expected to last <3 days In/Out Bed: Needs assistance Is this a change from baseline?: Change from baseline, expected to last <3  days Walks in Home: Needs assistance Is this a change from baseline?: Change from baseline, expected to last <3 days Does the patient have difficulty walking or climbing stairs?: Yes Weakness of Legs: Both Weakness of Arms/Hands: Both  Permission Sought/Granted                  Emotional Assessment              Admission diagnosis:  Hypokalemia [E87.6] Acute respiratory failure with hypoxia ([place]) [J96.01] Generalized weakness [R53.1] Acute sepsis ([place]) [A41.9] Sepsis due to COVID-19 ([place]) [U07.1, A41.89] COVID-19 [U07.1] Patient Active Problem List   Diagnosis Date Noted   Moderate tricuspid regurgitation 06/04/2022   Sepsis due to COVID-19 ([place]) 06/03/2022   Hypokalemia 06/03/2022   Dyslipidemia 06/03/2022   GERD without esophagitis 06/03/2022   Hypotension 06/03/2022   Anorexia 01/31/2022   Malnutrition of moderate degree ([place]) 11/07/2020   Ascending cholangitis 11/07/2020   Choledocholithiasis 10/27/2020   Bacteremia due to Gram-negative bacteria 10/27/2020   Generalized weakness 10/25/2020   Frequent falls 10/25/2020   Nondisplaced fracture of proximal phalanx of right lesser toe(s), initial encounter for closed fracture 10/13/2020   COVID-19 virus infection 10/12/2020   History of osteomyelitis 10/10/2020   Pedal edema 09/22/2020   Skin cancer (melanoma) ([place]) 07/19/2020   IDA (iron deficiency anemia) 06/27/2020  MCI (mild cognitive impairment) with memory loss 12/28/2019   Acute respiratory failure with hypoxia (HCC) 10/25/2019   Acute on chronic diastolic CHF (congestive heart failure) ([place]) 10/25/2019   DNR (do not resuscitate) 10/25/2019   Closed fracture of left distal radius and ulna 10/25/2019   Closed fracture of right wrist    Palliative care by specialist    CKD (chronic kidney disease) stage 3, GFR 30-59 ml/min ([place]) 10/24/2019   Ulcer of right foot ([place]) 08/14/2019   CAD (coronary artery disease), native coronary artery 07/13/2019    Dyspnea on exertion    Pulmonary hypertension, unspecified ([place])    Urge incontinence 05/06/2019   PAD (peripheral artery disease) ([place]) 03/12/2019   Hypertrophic toenail 01/27/2019   Cyanosis 01/12/2019   Hemorrhoid 11/25/2018   Vitamin B12 deficiency 08/04/2018   Vitamin D deficiency 02/25/2018   Peripheral neuropathy 12/04/2017   General unsteadiness 10/01/2017   Chronic constipation 10/01/2017   Esophageal dysphagia 04/08/2017   Chronic radicular pain of lower back 04/08/2017   Fall with injury 03/04/2017   Encounter for chronic pain management 11/11/2016   Breast mass, right 09/29/2016   Abdominal aortic atherosclerosis ([place]) 09/07/2015   Insomnia 06/08/2015   Health maintenance examination 12/08/2014   Advanced care planning/counseling discussion 12/08/2014   Chronic leg pain 10/13/2014   Fatigue 10/28/2013   Persistent atrial fibrillation ([place]) 04/24/2013   Medicare annual wellness visit, subsequent 04/14/2012   Chest tightness 03/16/2012   RLS (restless legs syndrome) 02/07/2012   DDD (degenerative disc disease), lumbar 12/18/2009   MDD (major depressive disorder), recurrent episode ([place]) 12/10/2007   HLD (hyperlipidemia) 11/24/2006   PLMD (periodic limb movement disorder) 11/24/2006   Essential hypertension 11/24/2006   GERD 11/24/2006   GASTRIC ULCER 11/24/2006   Osteoarthritis 11/24/2006   Osteoporosis 11/24/2006   PCP:  Ria Bush, MD Pharmacy:   [facility] [place] [address] [place] [address] [place] [address] Phone: [phone] Fax: [phone]     Social Determinants of Health (SDOH) Social History: SDOH Screenings   Food Insecurity: No Food Insecurity (06/03/2022)  Housing: Low Risk  (06/03/2022)  Transportation Needs: No Transportation Needs (06/03/2022)  Utilities: Not At Risk (06/03/2022)  Alcohol Screen: Low Risk  (08/27/2021)  Depression (PHQ2-9): Low Risk   (08/27/2021)  Financial Resource Strain: Low Risk  (08/27/2021)  Physical Activity: Inactive (08/27/2021)  Social Connections: Socially Isolated (08/27/2021)  Stress: No Stress Concern Present (08/27/2021)  Tobacco Use: Medium Risk (06/03/2022)   SDOH Interventions:     Readmission Risk Interventions     No data to display

## 2022-06-05 ENCOUNTER — Encounter: Payer: Self-pay | Admitting: Family Medicine

## 2022-06-05 ENCOUNTER — Inpatient Hospital Stay: Payer: Medicare Other

## 2022-06-05 DIAGNOSIS — U071 COVID-19: Secondary | ICD-10-CM | POA: Diagnosis not present

## 2022-06-05 DIAGNOSIS — A4189 Other specified sepsis: Secondary | ICD-10-CM | POA: Diagnosis not present

## 2022-06-05 LAB — CBC WITH DIFFERENTIAL/PLATELET
Abs Immature Granulocytes: 0.1 10*3/uL — ABNORMAL HIGH (ref 0.00–0.07)
Basophils Absolute: 0 10*3/uL (ref 0.0–0.1)
Basophils Relative: 0 %
Eosinophils Absolute: 0 10*3/uL (ref 0.0–0.5)
Eosinophils Relative: 0 %
HCT: 37.8 % (ref 36.0–46.0)
Hemoglobin: 12.7 g/dL (ref 12.0–15.0)
Immature Granulocytes: 1 %
Lymphocytes Relative: 7 %
Lymphs Abs: 1.1 10*3/uL (ref 0.7–4.0)
MCH: 32.1 pg (ref 26.0–34.0)
MCHC: 33.6 g/dL (ref 30.0–36.0)
MCV: 95.5 fL (ref 80.0–100.0)
Monocytes Absolute: 0.2 10*3/uL (ref 0.1–1.0)
Monocytes Relative: 1 %
Neutro Abs: 15 10*3/uL — ABNORMAL HIGH (ref 1.7–7.7)
Neutrophils Relative %: 91 %
Platelets: 236 10*3/uL (ref 150–400)
RBC: 3.96 MIL/uL (ref 3.87–5.11)
RDW: 13.9 % (ref 11.5–15.5)
WBC: 16.5 10*3/uL — ABNORMAL HIGH (ref 4.0–10.5)
nRBC: 0 % (ref 0.0–0.2)

## 2022-06-05 LAB — COMPREHENSIVE METABOLIC PANEL
ALT: 29 U/L (ref 0–44)
AST: 38 U/L (ref 15–41)
Albumin: 3.4 g/dL — ABNORMAL LOW (ref 3.5–5.0)
Alkaline Phosphatase: 65 U/L (ref 38–126)
Anion gap: 14 (ref 5–15)
BUN: 20 mg/dL (ref 8–23)
CO2: 29 mmol/L (ref 22–32)
Calcium: 9.1 mg/dL (ref 8.9–10.3)
Chloride: 100 mmol/L (ref 98–111)
Creatinine, Ser: 0.87 mg/dL (ref 0.44–1.00)
GFR, Estimated: >60 mL/min (ref >60)
Glucose, Bld: 155 mg/dL — ABNORMAL HIGH (ref 70–99)
Potassium: 3.3 mmol/L — ABNORMAL LOW (ref 3.5–5.1)
Sodium: 143 mmol/L (ref 135–145)
Total Bilirubin: 0.7 mg/dL (ref 0.3–1.2)
Total Protein: 6.8 g/dL (ref 6.5–8.1)

## 2022-06-05 LAB — MAGNESIUM: Magnesium: 2 mg/dL (ref 1.7–2.4)

## 2022-06-05 LAB — TROPONIN I (HIGH SENSITIVITY)
Troponin I (High Sensitivity): 97 ng/L — ABNORMAL HIGH (ref <18)
Troponin I (High Sensitivity): 86 ng/L — ABNORMAL HIGH (ref <18)

## 2022-06-05 LAB — GLUCOSE, CAPILLARY
Glucose-Capillary: 163 mg/dL — ABNORMAL HIGH (ref 70–99)
Glucose-Capillary: 227 mg/dL — ABNORMAL HIGH (ref 70–99)
Glucose-Capillary: 100 mg/dL — ABNORMAL HIGH (ref 70–99)
Glucose-Capillary: 240 mg/dL — ABNORMAL HIGH (ref 70–99)

## 2022-06-05 LAB — FERRITIN: Ferritin: 104 ng/mL (ref 11–307)

## 2022-06-05 LAB — C-REACTIVE PROTEIN: CRP: 5.2 mg/dL — ABNORMAL HIGH

## 2022-06-05 LAB — D-DIMER, QUANTITATIVE: D-Dimer, Quant: 0.71 ug/mL-FEU — ABNORMAL HIGH (ref 0.00–0.50)

## 2022-06-05 MED ORDER — IOHEXOL 350 MG/ML SOLN
75.0000 mL | Freq: Once | INTRAVENOUS | Status: AC | PRN
  Administered 2022-06-05: 75 mL via INTRAVENOUS

## 2022-06-05 MED ORDER — POTASSIUM CHLORIDE CRYS ER 20 MEQ PO TBCR
40.0000 meq | EXTENDED_RELEASE_TABLET | Freq: Once | ORAL | Status: AC
  Administered 2022-06-05: 40 meq via ORAL
  Filled 2022-06-05: qty 2

## 2022-06-05 NOTE — NC FL2 (Signed)
[place] LEVEL OF CARE FORM     IDENTIFICATION  Patient Name: [name] Birthdate: 05-Apr-1930 Sex: female Admission Date (Current Location): 06/02/2022  [facility] and [place] Number:  Engineering geologist and Address:  [facility], [address], [place], [place] [address]      Provider Number: [id number]  Attending Physician Name and Address:  Gwynne Edinger, MD  Relative Name and Phone Number:  [name] (Daughter) [phone]    Current Level of Care: Hospital Recommended Level of Care: [place] Prior Approval Number:    Date Approved/Denied:   PASRR Number: [id number]  Discharge Plan: SNF    Current Diagnoses: Patient Active Problem List   Diagnosis Date Noted   Moderate tricuspid regurgitation 06/04/2022   Sepsis due to COVID-19 [facility]) 06/03/2022   Hypokalemia 06/03/2022   Dyslipidemia 06/03/2022   GERD without esophagitis 06/03/2022   Hypotension 06/03/2022   Anorexia 01/31/2022   Malnutrition of moderate degree ([place]) 11/07/2020   Ascending cholangitis 11/07/2020   Choledocholithiasis 10/27/2020   Bacteremia due to Gram-negative bacteria 10/27/2020   Generalized weakness 10/25/2020   Frequent falls 10/25/2020   Nondisplaced fracture of proximal phalanx of right lesser toe(s), initial encounter for closed fracture 10/13/2020   COVID-19 virus infection 10/12/2020   History of osteomyelitis 10/10/2020   Pedal edema 09/22/2020   Skin cancer (melanoma) ([place]) 07/19/2020   IDA (iron deficiency anemia) 06/27/2020   MCI (mild cognitive impairment) with memory loss 12/28/2019   Acute respiratory failure with hypoxia ([place]) 10/25/2019   Acute on chronic diastolic CHF (congestive heart failure) ([place]) 10/25/2019   DNR (do not resuscitate) 10/25/2019   Closed fracture of left distal radius and ulna 10/25/2019   Closed fracture of right wrist    Palliative care by specialist    CKD (chronic kidney  disease) stage 3, GFR 30-59 ml/min ([place]) 10/24/2019   Ulcer of right foot ([place]) 08/14/2019   CAD (coronary artery disease), native coronary artery 07/13/2019   Dyspnea on exertion    Pulmonary hypertension, unspecified ([place])    Urge incontinence 05/06/2019   PAD (peripheral artery disease) ([place]) 03/12/2019   Hypertrophic toenail 01/27/2019   Cyanosis 01/12/2019   Hemorrhoid 11/25/2018   Vitamin B12 deficiency 08/04/2018   Vitamin D deficiency 02/25/2018   Peripheral neuropathy 12/04/2017   General unsteadiness 10/01/2017   Chronic constipation 10/01/2017   Esophageal dysphagia 04/08/2017   Chronic radicular pain of lower back 04/08/2017   Fall with injury 03/04/2017   Encounter for chronic pain management 11/11/2016   Breast mass, right 09/29/2016   Abdominal aortic atherosclerosis ([place]) 09/07/2015   Insomnia 06/08/2015   Health maintenance examination 12/08/2014   Advanced care planning/counseling discussion 12/08/2014   Chronic leg pain 10/13/2014   Fatigue 10/28/2013   Persistent atrial fibrillation ([place]) 04/24/2013   Medicare annual wellness visit, subsequent 04/14/2012   Chest tightness 03/16/2012   RLS (restless legs syndrome) 02/07/2012   DDD (degenerative disc disease), lumbar 12/18/2009   MDD (major depressive disorder), recurrent episode ([place]) 12/10/2007   HLD (hyperlipidemia) 11/24/2006   PLMD (periodic limb movement disorder) 11/24/2006   Essential hypertension 11/24/2006   GERD 11/24/2006   GASTRIC ULCER 11/24/2006   Osteoarthritis 11/24/2006   Osteoporosis 11/24/2006    Orientation RESPIRATION BLADDER Height & Weight        O2 (HFNC 7L/ min) External catheter Weight: 67.6 kg Height:  '5\' 2"'$  (157.5 cm)  BEHAVIORAL SYMPTOMS/MOOD NEUROLOGICAL BOWEL NUTRITION STATUS     (n/a) Continent  Diet (Carb modified)  AMBULATORY STATUS COMMUNICATION OF NEEDS Skin   Limited Assist Verbally Bruising (Bilateral arms)                       Personal Care Assistance  Level of Assistance  Bathing, Dressing Bathing Assistance: Limited assistance   Dressing Assistance: Limited assistance     Functional Limitations Info  Hearing   Hearing Info: Impaired      SPECIAL CARE FACTORS FREQUENCY  PT (By licensed PT), OT (By licensed OT)     PT Frequency: Min 2x weekly OT Frequency: Min 2x weekly            Contractures Contractures Info: Not present    Additional Factors Info  Code Status, Allergies Code Status Info: DNR Allergies Info: Penicillins           Current Medications (06/05/2022):  This is the current hospital active medication list Current Facility-Administered Medications  Medication Dose Route Frequency Provider Last Rate Last Admin   acetaminophen (TYLENOL) tablet 650 mg  650 mg Oral Q6H PRN Mansy, Jan A, MD       acyclovir (ZOVIRAX) 200 MG capsule 400 mg  400 mg Oral BID Mansy, Jan A, MD   400 mg at 06/05/22 0856   apixaban (ELIQUIS) tablet 2.5 mg  2.5 mg Oral BID Tammera, Cumplido, RPH   2.5 mg at 06/05/22 [id number]   ascorbic acid (VITAMIN C) tablet 500 mg  500 mg Oral Daily Mansy, Jan A, MD   500 mg at 06/05/22 0857   chlorpheniramine-HYDROcodone (TUSSIONEX) 10-8 MG/5ML suspension 5 mL  5 mL Oral Q12H PRN Mansy, Jan A, MD       docusate sodium (COLACE) capsule 100 mg  100 mg Oral Daily Mansy, Jan A, MD   100 mg at 06/05/22 0857   DULoxetine (CYMBALTA) DR capsule 60 mg  60 mg Oral Daily Mansy, Jan A, MD   60 mg at 06/05/22 0857   famotidine (PEPCID) tablet 10 mg  10 mg Oral BID Mansy, Jan A, MD   10 mg at 06/05/22 0856   furosemide (LASIX) injection 40 mg  40 mg Intravenous Q12H Sharen Hones, MD   40 mg at 06/05/22 1216   gabapentin (NEURONTIN) capsule 400 mg  400 mg Oral QHS Renda Rolls, RPH   400 mg at 06/04/22 2210   And   gabapentin (NEURONTIN) capsule 400 mg  400 mg Oral Daily PRN Renda Rolls, RPH       guaiFENesin-dextromethorphan (ROBITUSSIN DM) 100-10 MG/5ML syrup 10 mL  10 mL Oral Q4H PRN Mansy, Jan A, MD        hydrocortisone (ANUSOL-HC) 2.5 % rectal cream 1 Application  1 Application Rectal Daily PRN Mansy, Jan A, MD       insulin aspart (novoLOG) injection 0-5 Units  0-5 Units Subcutaneous QHS Sharen Hones, MD       insulin aspart (novoLOG) injection 0-9 Units  0-9 Units Subcutaneous TID WC Sharen Hones, MD   3 Units at 06/05/22 1216   magnesium hydroxide (MILK OF MAGNESIA) suspension 30 mL  30 mL Oral Daily PRN Dayanne, [place], RPH       memantine [facility]) tablet 10 mg  10 mg Oral BID Mansy, Jan A, MD   10 mg at 06/05/22 0856   metoprolol succinate (TOPROL-XL) 24 hr tablet 100 mg  100 mg Oral BID Mansy, Jan A, MD   100 mg at 06/05/22 0856   metoprolol  tartrate (LOPRESSOR) injection 5 mg  5 mg Intravenous Once PRN Foust, Katy L, NP       morphine (PF) 2 MG/ML injection 0.5 mg  0.5 mg Intravenous Once Foust, Katy L, NP       ondansetron (ZOFRAN) tablet 4 mg  4 mg Oral Q6H PRN Mansy, Jan A, MD       Or   ondansetron [facility]) injection 4 mg  4 mg Intravenous Q6H PRN Mansy, Jan A, MD       pantoprazole (PROTONIX) EC tablet 40 mg  40 mg Oral Daily Mansy, Jan A, MD   40 mg at 06/05/22 0856   polyethylene glycol (MIRALAX / GLYCOLAX) packet 17 g  17 g Oral Daily PRN Mansy, Jan A, MD       prednisoLONE acetate (PRED FORTE) 1 % ophthalmic suspension 1 drop  1 drop Left Eye Daily Mansy, Jan A, MD   1 drop at 06/05/22 0904   [START ON 06/06/2022] predniSONE (DELTASONE) tablet 50 mg  50 mg Oral Daily Mansy, Jan A, MD       rOPINIRole (REQUIP) tablet 2.5 mg  2.5 mg Oral QHS Mansy, Jan A, MD   2.5 mg at 06/04/22 2210   tiZANidine (ZANAFLEX) tablet 2 mg  2 mg Oral Daily Mansy, Jan A, MD   2 mg at 06/05/22 [id number]   traMADol (ULTRAM) tablet 50-100 mg  50-100 mg Oral QHS PRN Mansy, Jan A, MD       traZODone (DESYREL) tablet 25 mg  25 mg Oral QHS PRN Mansy, Jan A, MD   25 mg at 06/04/22 2218   Vitamin D (Ergocalciferol) (DRISDOL) 1.25 MG (50000 UNIT) capsule 50,000 Units  50,000 Units Oral Q7 days Mansy, Jan A, MD        zinc sulfate capsule 220 mg  220 mg Oral Daily Mansy, Jan A, MD   220 mg at 06/05/22 [id number]     Discharge Medications: Please see discharge summary for a list of discharge medications.  Relevant Imaging Results:  Relevant Lab Results:   Additional Information SS# [id number]  Laurena Slimmer, RN

## 2022-06-05 NOTE — Progress Notes (Signed)
Physical Therapy Treatment Patient Details Name: [name] MRN: [id number] DOB: 04-06-1930 Today's Date: 06/05/2022   History of Present Illness presented to ER secondary to progressive weakness, inability to walk and fever; admitted for mangement of sepsis, acute respiratory failure with hypoxia related to COVID-19    PT Comments    Patient received in bed, she is agreeable to PT. States she is feeling great. Daughter in room. Patient has decreased awareness to her respiratory limitations. She required min guard and heavy use of bed rails for supine to sit and once sitting she became VERY sob and was clutching chest. Required cues to calm and perform PLB. Patient was able to settle after a couple of minutes seated edge of bed. HR up to 135-140. Was unable to get O2 saturation reading. Patient was then able to step pivot to recliner with min/mod A. Ambulation held due to her SOB. O2 sats at 97-98% once resting in recliner. Patient will continue to benefit from skilled PT to improve functional independence, activity tolerance and safety with mobility.       Recommendations for follow up therapy are one component of a multi-disciplinary discharge planning process, led by the attending physician.  Recommendations may be updated based on patient status, additional functional criteria and insurance authorization.  Follow Up Recommendations  Skilled nursing-short term rehab (<3 hours/day)     Assistance Recommended at Discharge Frequent or constant Supervision/Assistance  Patient can return home with the following A lot of help with walking and/or transfers;A lot of help with bathing/dressing/bathroom;Assist for transportation;Assistance with cooking/housework   Equipment Recommendations  None recommended by PT    Recommendations for Other Services       Precautions / Restrictions Precautions Precautions: Fall Restrictions Weight Bearing Restrictions: No     Mobility  Bed  Mobility Overal bed mobility: Needs Assistance Bed Mobility: Supine to Sit     Supine to sit: Min guard     General bed mobility comments: increased effort required to get to sitting, heavy use of bed rail. Once sitting patient visibly VERY SOB, clutching chest, pulse Ox not picking up. HR up to 135-140. Required calming and cues for PLB to settle.    Transfers Overall transfer level: Needs assistance Equipment used: 1 person hand held assist Transfers: Sit to/from Stand, Bed to chair/wheelchair/BSC Sit to Stand: Min assist   Step pivot transfers: Mod assist            Ambulation/Gait               General Gait Details: did not ambulate today since she was so SOB with bed mobility.   Stairs             Wheelchair Mobility    Modified Rankin (Stroke Patients Only)       Balance Overall balance assessment: Needs assistance Sitting-balance support: Feet supported Sitting balance-Leahy Scale: Good     Standing balance support: Single extremity supported, During functional activity, Reliant on assistive device for balance Standing balance-Leahy Scale: Fair                              Cognition Arousal/Alertness: Awake/alert Behavior During Therapy: WFL for tasks assessed/performed Overall Cognitive Status: Within Functional Limits for tasks assessed  Exercises      General Comments        Pertinent Vitals/Pain Pain Assessment Pain Assessment: No/denies pain    Home Living                          Prior Function            PT Goals (current goals can now be found in the care plan section) Acute Rehab PT Goals Patient Stated Goal: to get stronger and return home PT Goal Formulation: With patient/family Time For Goal Achievement: 06/17/22 Potential to Achieve Goals: Fair Progress towards PT goals: Progressing toward goals    Frequency    Min  2X/week      PT Plan Discharge plan needs to be updated    Co-evaluation              AM-PAC PT "6 Clicks" Mobility   Outcome Measure  Help needed turning from your back to your side while in a flat bed without using bedrails?: A Little Help needed moving from lying on your back to sitting on the side of a flat bed without using bedrails?: A Little Help needed moving to and from a bed to a chair (including a wheelchair)?: A Lot Help needed standing up from a chair using your arms (e.g., wheelchair or bedside chair)?: A Little Help needed to walk in hospital room?: A Lot Help needed climbing 3-5 steps with a railing? : Total 6 Click Score: 14    End of Session Equipment Utilized During Treatment: Oxygen Activity Tolerance: Treatment limited secondary to medical complications (Comment);Other (comment) (SOB) Patient left: in chair;with call bell/phone within reach;with family/visitor present Nurse Communication: Mobility status PT Visit Diagnosis: Muscle weakness (generalized) (M62.81);Difficulty in walking, not elsewhere classified (R26.2);Other abnormalities of gait and mobility (R26.89);Unsteadiness on feet (R26.81)     Time: [id number] PT Time Calculation (min) (ACUTE ONLY): 30 min  Charges:  $Therapeutic Activity: 23-37 mins                     Scotland Korver, PT, GCS 06/05/22,11:16 AM

## 2022-06-05 NOTE — TOC Progression Note (Signed)
Transition of Care [facility]) - Progression Note    Patient Details  Name: [name] MRN: [id number] Date of Birth: 10-30-1929  Transition of Care [facility]) CM/SW Contact  Laurena Slimmer, RN Phone Number: 06/05/2022, 8:33 PM  Clinical Narrative:    [place] PASSR retrieved  FL2 completed          Expected Discharge Plan and Services                                               Social Determinants of Health (SDOH) Interventions SDOH Screenings   Food Insecurity: No Food Insecurity (06/03/2022)  Housing: Low Risk  (06/03/2022)  Transportation Needs: No Transportation Needs (06/03/2022)  Utilities: Not At Risk (06/03/2022)  Alcohol Screen: Low Risk  (08/27/2021)  Depression (PHQ2-9): Low Risk  (08/27/2021)  Financial Resource Strain: Low Risk  (08/27/2021)  Physical Activity: Inactive (08/27/2021)  Social Connections: Socially Isolated (08/27/2021)  Stress: No Stress Concern Present (08/27/2021)  Tobacco Use: Medium Risk (06/05/2022)    Readmission Risk Interventions     No data to display

## 2022-06-05 NOTE — Plan of Care (Signed)
  Problem: Education: Goal: Knowledge of risk factors and measures for prevention of condition will improve Outcome: Progressing   Problem: Coping: Goal: Psychosocial and spiritual needs will be supported Outcome: Progressing   Problem: Respiratory: Goal: Will maintain a patent airway Outcome: Progressing Goal: Complications related to the disease process, condition or treatment will be avoided or minimized Outcome: Progressing   Problem: Education: Goal: Knowledge of General Education information will improve Description: Including pain rating scale, medication(s)/side effects and non-pharmacologic comfort measures Outcome: Progressing   Problem: Health Behavior/Discharge Planning: Goal: Ability to manage health-related needs will improve Outcome: Progressing   Problem: Clinical Measurements: Goal: Ability to maintain clinical measurements within normal limits will improve Outcome: Progressing Goal: Will remain free from infection Outcome: Progressing Goal: Diagnostic test results will improve Outcome: Progressing Goal: Respiratory complications will improve Outcome: Progressing Goal: Cardiovascular complication will be avoided Outcome: Progressing   Problem: Activity: Goal: Risk for activity intolerance will decrease Outcome: Progressing   Problem: Nutrition: Goal: Adequate nutrition will be maintained Outcome: Progressing   Problem: Coping: Goal: Level of anxiety will decrease Outcome: Progressing   Problem: Elimination: Goal: Will not experience complications related to bowel motility Outcome: Progressing Goal: Will not experience complications related to urinary retention Outcome: Progressing   Problem: Pain Managment: Goal: General experience of comfort will improve Outcome: Progressing   Problem: Safety: Goal: Ability to remain free from injury will improve Outcome: Progressing   Problem: Skin Integrity: Goal: Risk for impaired skin integrity will  decrease Outcome: Progressing   Problem: Education: Goal: Ability to describe self-care measures that may prevent or decrease complications (Diabetes Survival Skills Education) will improve Outcome: Progressing Goal: Individualized Educational Video(s) Outcome: Progressing   Problem: Coping: Goal: Ability to adjust to condition or change in health will improve Outcome: Progressing   Problem: Fluid Volume: Goal: Ability to maintain a balanced intake and output will improve Outcome: Progressing   Problem: Health Behavior/Discharge Planning: Goal: Ability to identify and utilize available resources and services will improve Outcome: Progressing Goal: Ability to manage health-related needs will improve Outcome: Progressing   Problem: Metabolic: Goal: Ability to maintain appropriate glucose levels will improve Outcome: Progressing   Problem: Nutritional: Goal: Maintenance of adequate nutrition will improve Outcome: Progressing Goal: Progress toward achieving an optimal weight will improve Outcome: Progressing   Problem: Skin Integrity: Goal: Risk for impaired skin integrity will decrease Outcome: Progressing   Problem: Tissue Perfusion: Goal: Adequacy of tissue perfusion will improve Outcome: Progressing   

## 2022-06-05 NOTE — Progress Notes (Addendum)
  Progress Note   [name] [id number] DOB: 02/28/1930 DOA: 06/02/2022     2 DOS: the patient was seen and examined on 06/05/2022   Brief hospital course: No notes on file    Principal Problem:   Sepsis due to COVID-19 [facility]) Active Problems:   Hypokalemia   Generalized weakness   Essential hypertension   Peripheral neuropathy   Acute respiratory failure with hypoxia (HCC)   Acute on chronic diastolic CHF (congestive heart failure) (HCC)   Dyslipidemia   GERD without esophagitis   Hypotension   Moderate tricuspid regurgitation   Assessment and Plan: * Sepsis due to COVID-19 ([place]) Acute hypoxemic respite failure secondary to COVID infection. Diastolic congestive heart failure Moderate tricuspid regurgitation. Treated with Paxlovid and IV steroids.  She developed volume overload with severe respiratory distress on the morning of 2/27, chest x-ray showed evidence of volume overload.  BNP elevated.  She received IV Lasix last night, patient will continue IV Lasix. Reviewed echocardiogram performed in 04/2022, ejection fraction 50 to 55%, moderate tricuspid regurgitation, diastolic function indeterminant. Continue IV Lasix at 40 mg twice a day. Stop paxlovid given hypoxia Check CTA, troponin Cont [place] O2, currently 7 liters, breathing comfortably   Essential hypertension Hypotension. Patient received IV fluid bolus for hypotension.  Blood pressure is more stable.  Discontinued lisinopril due to hypotension.   Hypokalemia repleting   Generalized weakness Pt advising snf, have consulted toc   GERD without esophagitis PPI.   Dyslipidemia - We will continue statin therapy.   Peripheral neuropathy  We will continue Neurontin.  Hyperglycemia 2/2 steroids - continue SSI, switch to carb modified diet  A-fib, permanent - cont apixaban      Subjective:  Dyspnea improving, tolerating diet  Physical Exam: Vitals:   06/04/22 2347 06/05/22 0452  06/05/22 0838 06/05/22 1213  BP: (!) 166/94 (!) 138/94 (!) 176/87 139/84  Pulse: 96 (!) 105 (!) 101 85  Resp: 18 20 18 17  $ Temp: 98.7 F (37.1 C) 98.1 F (36.7 C) 97.7 F (36.5 C) 97.7 F (36.5 C)  TempSrc: Oral Oral Oral Oral  SpO2: 93% 96% 97% 93%  Weight:      Height:       General exam: Appears calm and comfortable  Respiratory system: Coarse breath sounds with crackles in the bases bilaterally. Respiratory effort normal. Cardiovascular system: S1 & S2 heard, RRR. No JVD, murmurs, rubs, gallops or clicks. No pedal edema. Gastrointestinal system: Abdomen is nondistended, soft and nontender. No organomegaly or masses felt. Normal bowel sounds heard. Central nervous system: Alert and oriented x3. No focal neurological deficits. Extremities: Symmetric 5 x 5 power. Skin: No rashes, lesions or ulcers Psychiatry: Mood & affect appropriate.    Data Reviewed:  There are no new results to review at this time.  Family Communication: Daughter updated at bedside 2/28  Disposition: Status is: Inpatient Remains inpatient appropriate because:, Severity of disease, IV treatment. Dispo: SNF   Time spent: 45 minutes  Author: Desma Maxim, MD 06/05/2022 1:15 PM  For on call review [email or web address].

## 2022-06-06 DIAGNOSIS — A4189 Other specified sepsis: Secondary | ICD-10-CM | POA: Diagnosis not present

## 2022-06-06 DIAGNOSIS — U071 COVID-19: Secondary | ICD-10-CM | POA: Diagnosis not present

## 2022-06-06 LAB — CBC WITH DIFFERENTIAL/PLATELET
Abs Immature Granulocytes: 0.08 10*3/uL — ABNORMAL HIGH (ref 0.00–0.07)
Basophils Absolute: 0 10*3/uL (ref 0.0–0.1)
Basophils Relative: 0 %
Eosinophils Absolute: 0 10*3/uL (ref 0.0–0.5)
Eosinophils Relative: 0 %
HCT: 35.5 % — ABNORMAL LOW (ref 36.0–46.0)
Hemoglobin: 11.7 g/dL — ABNORMAL LOW (ref 12.0–15.0)
Immature Granulocytes: 1 %
Lymphocytes Relative: 7 %
Lymphs Abs: 0.8 10*3/uL (ref 0.7–4.0)
MCH: 31.6 pg (ref 26.0–34.0)
MCHC: 33 g/dL (ref 30.0–36.0)
MCV: 95.9 fL (ref 80.0–100.0)
Monocytes Absolute: 0.3 10*3/uL (ref 0.1–1.0)
Monocytes Relative: 3 %
Neutro Abs: 11 10*3/uL — ABNORMAL HIGH (ref 1.7–7.7)
Neutrophils Relative %: 89 %
Platelets: 216 10*3/uL (ref 150–400)
RBC: 3.7 MIL/uL — ABNORMAL LOW (ref 3.87–5.11)
RDW: 14 % (ref 11.5–15.5)
WBC: 12.3 10*3/uL — ABNORMAL HIGH (ref 4.0–10.5)
nRBC: 0 % (ref 0.0–0.2)

## 2022-06-06 LAB — GLUCOSE, CAPILLARY
Glucose-Capillary: 151 mg/dL — ABNORMAL HIGH (ref 70–99)
Glucose-Capillary: 210 mg/dL — ABNORMAL HIGH (ref 70–99)
Glucose-Capillary: 150 mg/dL — ABNORMAL HIGH (ref 70–99)
Glucose-Capillary: 159 mg/dL — ABNORMAL HIGH (ref 70–99)

## 2022-06-06 LAB — COMPREHENSIVE METABOLIC PANEL
ALT: 26 U/L (ref 0–44)
AST: 27 U/L (ref 15–41)
Albumin: 3.2 g/dL — ABNORMAL LOW (ref 3.5–5.0)
Alkaline Phosphatase: 61 U/L (ref 38–126)
Anion gap: 12 (ref 5–15)
BUN: 29 mg/dL — ABNORMAL HIGH (ref 8–23)
CO2: 33 mmol/L — ABNORMAL HIGH (ref 22–32)
Calcium: 9.2 mg/dL (ref 8.9–10.3)
Chloride: 98 mmol/L (ref 98–111)
Creatinine, Ser: 0.88 mg/dL (ref 0.44–1.00)
GFR, Estimated: >60 mL/min (ref >60)
Glucose, Bld: 146 mg/dL — ABNORMAL HIGH (ref 70–99)
Potassium: 3.3 mmol/L — ABNORMAL LOW (ref 3.5–5.1)
Sodium: 143 mmol/L (ref 135–145)
Total Bilirubin: 0.8 mg/dL (ref 0.3–1.2)
Total Protein: 6.4 g/dL — ABNORMAL LOW (ref 6.5–8.1)

## 2022-06-06 LAB — C-REACTIVE PROTEIN: CRP: 2.4 mg/dL — ABNORMAL HIGH (ref <1.0)

## 2022-06-06 LAB — D-DIMER, QUANTITATIVE: D-Dimer, Quant: 0.52 ug/mL-FEU — ABNORMAL HIGH (ref 0.00–0.50)

## 2022-06-06 LAB — HEMOGLOBIN A1C
Hgb A1c MFr Bld: 6.5 % — ABNORMAL HIGH (ref 4.8–5.6)
Mean Plasma Glucose: 140 mg/dL

## 2022-06-06 MED ORDER — PREDNISONE 20 MG PO TABS
40.0000 mg | ORAL_TABLET | Freq: Every day | ORAL | Status: DC
  Administered 2022-06-07 – 2022-06-11 (×5): 40 mg via ORAL
  Filled 2022-06-06 (×6): qty 2

## 2022-06-06 MED ORDER — APIXABAN 5 MG PO TABS
5.0000 mg | ORAL_TABLET | Freq: Two times a day (BID) | ORAL | Status: DC
  Administered 2022-06-06 – 2022-06-11 (×10): 5 mg via ORAL
  Filled 2022-06-06 (×10): qty 1

## 2022-06-06 MED ORDER — POTASSIUM CHLORIDE CRYS ER 20 MEQ PO TBCR
60.0000 meq | EXTENDED_RELEASE_TABLET | Freq: Once | ORAL | Status: AC
  Administered 2022-06-06: 60 meq via ORAL
  Filled 2022-06-06: qty 3

## 2022-06-06 NOTE — Progress Notes (Signed)
MEDICATION RELATED NOTE  Apixaban for Afib  Patient Measurements: Height: '5\' 2"'$  (157.5 cm) Weight: 67.6 kg (149 lb) IBW/kg (Calculated) : 50.1   Vital Signs: Temp: 97.5 F (36.4 C) (02/29 0803) Temp Source: Oral (02/29 0422) BP: 185/120 (02/29 0803) Pulse Rate: 95 (02/29 0803)   Labs: Recent Labs    06/04/22 0652 06/05/22 0310 06/06/22 0659  WBC 15.3* 16.5* 12.3*  HGB 13.1 12.7 11.7*  HCT 39.5 37.8 35.5*  PLT 222 236 216  CREATININE 0.95 0.87 0.88  MG 1.8 2.0  --   ALBUMIN 3.1* 3.4* 3.2*  PROT 6.2* 6.8 6.4*  AST 46* 38 27  ALT '31 29 26  '$ ALKPHOS 69 65 61  BILITOT 0.6 0.7 0.8   Estimated Creatinine Clearance: 37.5 mL/min (by C-G formula based on SCr of 0.88 mg/dL).   Assessment: DDI with Paxlovid on admission and Eliquis dose decreased from '5mg'$  po BID (PTA dose) to 2.'5mg'$  po BID. Paxlovid stopped 2/28.   Plan:  DDI resolved with last Paxlovid dose given 2/28 @ 1258. Will resume Eliquis (Apixaban) '5mg'$  po BID as previously prescribed for Afib (Age 4, Wt 67.6kg. Scr 0.88)  Delcia Spitzley Rodriguez-Guzman PharmD, BCPS 06/06/2022 10:11 AM

## 2022-06-06 NOTE — TOC Progression Note (Addendum)
Transition of Care [facility]) - Progression Note    Patient Details  Name: [name] MRN: [id number] Date of Birth: 1929-09-06  Transition of Care [facility]) CM/SW [place], LCSW Phone Number: 06/06/2022, 1:06 PM  Clinical Narrative: Patient on airborne precautions. Called into the room and daughter [name] answered. Discussed SNF recommendation and they are agreeable. Top preferences are [facility] and [facility]. Asked [facility] admissions coordinator to review. Discussed COVID isolation timeframe. Earliest patient may be able to discharge is Wednesday 3/6.    4:17 pm: [facility] and [facility] will have to wait until closer to discharge before making decision.  Expected Discharge Plan and Services                                               Social Determinants of Health (SDOH) Interventions SDOH Screenings   Food Insecurity: No Food Insecurity (06/03/2022)  Housing: Low Risk  (06/03/2022)  Transportation Needs: No Transportation Needs (06/03/2022)  Utilities: Not At Risk (06/03/2022)  Alcohol Screen: Low Risk  (08/27/2021)  Depression (PHQ2-9): Low Risk  (08/27/2021)  Financial Resource Strain: Low Risk  (08/27/2021)  Physical Activity: Inactive (08/27/2021)  Social Connections: Socially Isolated (08/27/2021)  Stress: No Stress Concern Present (08/27/2021)  Tobacco Use: Medium Risk (06/05/2022)    Readmission Risk Interventions     No data to display

## 2022-06-06 NOTE — Progress Notes (Signed)
Physical Therapy Treatment Patient Details Name: [name] MRN: [id number] DOB: 1929/12/29 Today's Date: 06/06/2022   History of Present Illness presented to ER secondary to progressive weakness, inability to walk and fever; admitted for mangement of sepsis, acute respiratory failure with hypoxia related to COVID-19    PT Comments    Patient received in recliner, RN in room. Daughter in room. She is agreeable to PT session. Patient is able to stand from recliner with cues and supervision. Ambulated 5 feet forward and backward with RW and min guard. She then stood again after rest and step pivoted back to bed. She performed supine LE exercises with cues. No overt sob this session O2 sats at end of session 97-98% on 5 liters. Patient will continue to benefit from skilled PT to improve strength and activity tolerance.       Recommendations for follow up therapy are one component of a multi-disciplinary discharge planning process, led by the attending physician.  Recommendations may be updated based on patient status, additional functional criteria and insurance authorization.  Follow Up Recommendations  Skilled nursing-short term rehab (<3 hours/day) Can patient physically be transported by private vehicle: Yes   Assistance Recommended at Discharge Frequent or constant Supervision/Assistance  Patient can return home with the following A little help with walking and/or transfers;A little help with bathing/dressing/bathroom;Assist for transportation;Help with stairs or ramp for entrance;Assistance with cooking/housework   Equipment Recommendations  None recommended by PT    Recommendations for Other Services       Precautions / Restrictions Precautions Precautions: Fall Restrictions Weight Bearing Restrictions: No     Mobility  Bed Mobility Overal bed mobility: Modified Independent Bed Mobility: Sit to Supine       Sit to supine: Modified independent (Device/Increase time)    General bed mobility comments: Patient able to get legs into bed with extra time. Cues for postitioning in bed. Improved respiratory tolerance this session. No overt signs of extreme sob.    Transfers Overall transfer level: Needs assistance Equipment used: Rolling walker (2 wheels) Transfers: Sit to/from Stand Sit to Stand: Supervision           General transfer comment: performed sit to stand x2, cues for hand placement    Ambulation/Gait Ambulation/Gait assistance: Min guard Gait Distance (Feet): 7 Feet Assistive device: Rolling walker (2 wheels) Gait Pattern/deviations: Step-through pattern, Decreased step length - right, Decreased step length - left Gait velocity: decr     General Gait Details: patient able to ambulate short distance today, she was less sob, but limited by O2 line. She could have gon further I believe this session.   Stairs             Wheelchair Mobility    Modified Rankin (Stroke Patients Only)       Balance Overall balance assessment: Needs assistance Sitting-balance support: Feet supported Sitting balance-Leahy Scale: Good     Standing balance support: Bilateral upper extremity supported, During functional activity, Reliant on assistive device for balance Standing balance-Leahy Scale: Good                              Cognition Arousal/Alertness: Awake/alert Behavior During Therapy: WFL for tasks assessed/performed Overall Cognitive Status: Within Functional Limits for tasks assessed  Exercises Total Joint Exercises Heel Slides: AROM, Both, 10 reps Hip ABduction/ADduction: AROM, Both, 10 reps Straight Leg Raises: AROM, Both, 10 reps    General Comments        Pertinent Vitals/Pain Pain Assessment Pain Assessment: No/denies pain    Home Living                          Prior Function            PT Goals (current goals can now be  found in the care plan section) Acute Rehab PT Goals Patient Stated Goal: to get stronger and return home PT Goal Formulation: With patient/family Time For Goal Achievement: 06/17/22 Potential to Achieve Goals: Fair Progress towards PT goals: Progressing toward goals    Frequency    Min 2X/week      PT Plan Current plan remains appropriate    Co-evaluation              AM-PAC PT "6 Clicks" Mobility   Outcome Measure  Help needed turning from your back to your side while in a flat bed without using bedrails?: A Little Help needed moving from lying on your back to sitting on the side of a flat bed without using bedrails?: A Little Help needed moving to and from a bed to a chair (including a wheelchair)?: A Little Help needed standing up from a chair using your arms (e.g., wheelchair or bedside chair)?: A Little Help needed to walk in hospital room?: A Little Help needed climbing 3-5 steps with a railing? : A Lot 6 Click Score: 17    End of Session Equipment Utilized During Treatment: Oxygen Activity Tolerance: Patient tolerated treatment well Patient left: in bed;with call bell/phone within reach;with bed alarm set;with family/visitor present Nurse Communication: Mobility status PT Visit Diagnosis: Muscle weakness (generalized) (M62.81);Difficulty in walking, not elsewhere classified (R26.2);Other abnormalities of gait and mobility (R26.89);Unsteadiness on feet (R26.81)     Time: [id number] PT Time Calculation (min) (ACUTE ONLY): 21 min  Charges:  $Therapeutic Exercise: 8-22 mins                     Abella Shugart, PT, GCS 06/06/22,3:09 PM

## 2022-06-06 NOTE — Progress Notes (Signed)
       CROSS COVER NOTE  NAME: [name] MRN: [id number] DOB : October 17, 1929    HPI/Events of Note   Nurse reports patient lost tip of her hearing aid in her right ear  Assessment and  Interventions   Assessment: Patient states she can feel it. Denies pain  Plan: Consulted ENT Dr Pryor Ochoa - discussed on phone - he will be in to see her in the morning to remove       Kathlene Cote NP [facility]

## 2022-06-06 NOTE — Progress Notes (Signed)
Progress Note   [name] [id number] DOB: Jan 10, 1930 DOA: 06/02/2022     2 DOS: the patient was seen and examined on 06/05/2022   Brief hospital course: [name] is a 89 y.o. Caucasian female with medical history significant for hypertension, dyslipidemia, rheumatoid arthritis, RLS,, who presented to emergency room with acute onset of generalized weakness with inability to walk.  She had temperature 101.7, heart rate 94, she was hypoxemic with 89% on room air, was placed on 2 L oxygen.  Her blood pressure was also low, she received fluid bolus.  COVID came back positive.  Patient treated with steroids and Paxlovid. Patient developed significant respiratory distress early morning on 2/27, diagnosed with acute on chronic diastolic congestive heart failure, started on IV Lasix.    Principal Problem:   Sepsis due to COVID-19 [name]) Active Problems:   Hypokalemia   Generalized weakness   Essential hypertension   Peripheral neuropathy   Acute respiratory failure with hypoxia (HCC)   Acute on chronic diastolic CHF (congestive heart failure) (HCC)   Dyslipidemia   GERD without esophagitis   Hypotension   Moderate tricuspid regurgitation   Assessment and Plan: * Sepsis due to COVID-19 [facility]) Acute hypoxemic respite failure secondary to COVID infection. Diastolic congestive heart failure Moderate tricuspid regurgitation. Treated with Paxlovid and IV steroids.  She developed volume overload with severe respiratory distress on the morning of 2/27, chest x-ray showed evidence of volume overload.  BNP elevated.   Reviewed echocardiogram performed in 04/2022, ejection fraction 50 to 55%, moderate tricuspid regurgitation, diastolic function indeterminant. Continue IV Lasix at 40 mg twice a day as kidney function allows Have stopped paxlovid given hypoxia Cont [name], currently 5 liters, breathing comfortably, down from 7 yesterday  Demand ischemia Mild trop elevation  yesterday, 90s>80s, asymptomatic   Essential hypertension Controlled on current regimen   Hypokalemia repleting   Generalized weakness Pt advising snf, have consulted toc   GERD without esophagitis PPI.   Dyslipidemia - We will continue statin therapy.   Peripheral neuropathy  We will continue Neurontin.  Hyperglycemia 2/2 steroids - continue SSI, carb modified diet  A-fib, permanent - cont apixaban      Subjective:  Dyspnea improving, tolerating diet, daughter thinks improving  Physical Exam: Vitals:   06/04/22 2347 06/05/22 0452 06/05/22 0838 06/05/22 1213  BP: (!) 166/94 (!) 138/94 (!) 176/87 139/84  Pulse: 96 (!) 105 (!) 101 85  Resp: '18 20 18 17  '$ Temp: 98.7 F (37.1 C) 98.1 F (36.7 C) 97.7 F (36.5 C) 97.7 F (36.5 C)  TempSrc: Oral Oral Oral Oral  SpO2: 93% 96% 97% 93%  Weight:      Height:       General exam: Appears calm and comfortable  Respiratory system: Coarse breath sounds with crackles in the bases bilaterally. Respiratory effort normal. Cardiovascular system: S1 & S2 heard, RRR. No JVD, murmurs, rubs, gallops or clicks. No pedal edema. Gastrointestinal system: Abdomen is nondistended, soft and nontender. No organomegaly or masses felt. Normal bowel sounds heard. Central nervous system: Alert and oriented x3. No focal neurological deficits. Extremities: Symmetric 5 x 5 power. Skin: No rashes, lesions or ulcers Psychiatry: Mood & affect appropriate.    Data Reviewed:  There are no new results to review at this time.  Family Communication: Daughter updated at bedside 2/29  Disposition: Status is: Inpatient Remains inpatient appropriate because:, Severity of disease  Dispo: SNF   Time spent: 35 minutes  Author: Patsy Payne  Crystal Raider, MD 06/05/2022 1:15 PM  For on call review [email or web address].

## 2022-06-06 NOTE — NC FL2 (Signed)
[place] LEVEL OF CARE FORM     IDENTIFICATION  Patient Name: [name] Birthdate: 06-29-1929 Sex: female Admission Date (Current Location): 06/02/2022  [facility] and [place] Number:  Engineering geologist and Address:  [facility], [address], [place], [place] [address]      Provider Number: [id number]  Attending Physician Name and Address:  Gwynne Edinger, MD  Relative Name and Phone Number:  [name] (Daughter) [phone]    Current Level of Care: Hospital Recommended Level of Care: [place] Prior Approval Number:    Date Approved/Denied:   PASRR Number: [id number]  Discharge Plan: SNF    Current Diagnoses: Patient Active Problem List   Diagnosis Date Noted   Moderate tricuspid regurgitation 06/04/2022   Sepsis due to COVID-19 [facility]) 06/03/2022   Hypokalemia 06/03/2022   Dyslipidemia 06/03/2022   GERD without esophagitis 06/03/2022   Hypotension 06/03/2022   Anorexia 01/31/2022   Malnutrition of moderate degree ([place]) 11/07/2020   Ascending cholangitis 11/07/2020   Choledocholithiasis 10/27/2020   Bacteremia due to Gram-negative bacteria 10/27/2020   Generalized weakness 10/25/2020   Frequent falls 10/25/2020   Nondisplaced fracture of proximal phalanx of right lesser toe(s), initial encounter for closed fracture 10/13/2020   COVID-19 virus infection 10/12/2020   History of osteomyelitis 10/10/2020   Pedal edema 09/22/2020   Skin cancer (melanoma) ([place]) 07/19/2020   IDA (iron deficiency anemia) 06/27/2020   MCI (mild cognitive impairment) with memory loss 12/28/2019   Acute respiratory failure with hypoxia ([place]) 10/25/2019   Acute on chronic diastolic CHF (congestive heart failure) ([place]) 10/25/2019   DNR (do not resuscitate) 10/25/2019   Closed fracture of left distal radius and ulna 10/25/2019   Closed fracture of right wrist    Palliative care by specialist    CKD (chronic kidney  disease) stage 3, GFR 30-59 ml/min ([place]) 10/24/2019   Ulcer of right foot ([place]) 08/14/2019   CAD (coronary artery disease), native coronary artery 07/13/2019   Dyspnea on exertion    Pulmonary hypertension, unspecified ([place])    Urge incontinence 05/06/2019   PAD (peripheral artery disease) ([place]) 03/12/2019   Hypertrophic toenail 01/27/2019   Cyanosis 01/12/2019   Hemorrhoid 11/25/2018   Vitamin B12 deficiency 08/04/2018   Vitamin D deficiency 02/25/2018   Peripheral neuropathy 12/04/2017   General unsteadiness 10/01/2017   Chronic constipation 10/01/2017   Esophageal dysphagia 04/08/2017   Chronic radicular pain of lower back 04/08/2017   Fall with injury 03/04/2017   Encounter for chronic pain management 11/11/2016   Breast mass, right 09/29/2016   Abdominal aortic atherosclerosis ([place]) 09/07/2015   Insomnia 06/08/2015   Health maintenance examination 12/08/2014   Advanced care planning/counseling discussion 12/08/2014   Chronic leg pain 10/13/2014   Fatigue 10/28/2013   Persistent atrial fibrillation ([place]) 04/24/2013   Medicare annual wellness visit, subsequent 04/14/2012   Chest tightness 03/16/2012   RLS (restless legs syndrome) 02/07/2012   DDD (degenerative disc disease), lumbar 12/18/2009   MDD (major depressive disorder), recurrent episode ([place]) 12/10/2007   HLD (hyperlipidemia) 11/24/2006   PLMD (periodic limb movement disorder) 11/24/2006   Essential hypertension 11/24/2006   GERD 11/24/2006   GASTRIC ULCER 11/24/2006   Osteoarthritis 11/24/2006   Osteoporosis 11/24/2006    Orientation RESPIRATION BLADDER Height & Weight     Self, Time, Situation, Place  O2 (Nasal Cannula 5 L. Weaning as tolerated. Last used bipap on morning on 2/27: 15/8 at 55%. MD does not think she will need it  at discharge.) Continent, External catheter Weight: 149 lb (67.6 kg) Height:  '5\' 2"'$  (157.5 cm)  BEHAVIORAL SYMPTOMS/MOOD NEUROLOGICAL BOWEL NUTRITION STATUS   (None)  (None)  Continent Diet (Carb modified)  AMBULATORY STATUS COMMUNICATION OF NEEDS Skin   Limited Assist Verbally Bruising                       Personal Care Assistance Level of Assistance  Bathing, Dressing Bathing Assistance: Limited assistance   Dressing Assistance: Limited assistance     Functional Limitations Info  Sight, Hearing, Speech Sight Info: Adequate Hearing Info: Adequate Speech Info: Adequate    SPECIAL CARE FACTORS FREQUENCY  PT (By licensed PT), OT (By licensed OT)     PT Frequency: 5 x week OT Frequency: 5 x week            Contractures Contractures Info: Not present    Additional Factors Info  Code Status, Allergies Code Status Info: DNR Allergies Info: Penicillins           Current Medications (06/06/2022):  This is the current hospital active medication list Current Facility-Administered Medications  Medication Dose Route Frequency Provider Last Rate Last Admin   acetaminophen (TYLENOL) tablet 650 mg  650 mg Oral Q6H PRN Mansy, Jan A, MD       acyclovir (ZOVIRAX) 200 MG capsule 400 mg  400 mg Oral BID Mansy, Jan A, MD   400 mg at 06/06/22 1100   apixaban (ELIQUIS) tablet 5 mg  5 mg Oral BID Wouk, Ailene Rud, MD       ascorbic acid (VITAMIN C) tablet 500 mg  500 mg Oral Daily Mansy, Jan A, MD   500 mg at 06/06/22 1101   chlorpheniramine-HYDROcodone (TUSSIONEX) 10-8 MG/5ML suspension 5 mL  5 mL Oral Q12H PRN Mansy, Jan A, MD       docusate sodium (COLACE) capsule 100 mg  100 mg Oral Daily Mansy, Jan A, MD   100 mg at 06/06/22 1059   DULoxetine (CYMBALTA) DR capsule 60 mg  60 mg Oral Daily Mansy, Jan A, MD   60 mg at 06/06/22 1059   famotidine (PEPCID) tablet 10 mg  10 mg Oral BID Mansy, Jan A, MD   10 mg at 06/06/22 1101   furosemide (LASIX) injection 40 mg  40 mg Intravenous Q12H Sharen Hones, MD   40 mg at 06/05/22 2330   gabapentin (NEURONTIN) capsule 400 mg  400 mg Oral QHS Renda Rolls, RPH   400 mg at 06/05/22 2233   And   gabapentin  (NEURONTIN) capsule 400 mg  400 mg Oral Daily PRN Renda Rolls, RPH       guaiFENesin-dextromethorphan (ROBITUSSIN DM) 100-10 MG/5ML syrup 10 mL  10 mL Oral Q4H PRN Mansy, Jan A, MD   10 mL at 06/05/22 2234   hydrocortisone (ANUSOL-HC) 2.5 % rectal cream 1 Application  1 Application Rectal Daily PRN Mansy, Jan A, MD       insulin aspart (novoLOG) injection 0-5 Units  0-5 Units Subcutaneous QHS Sharen Hones, MD   2 Units at 06/05/22 2235   insulin aspart (novoLOG) injection 0-9 Units  0-9 Units Subcutaneous TID WC Sharen Hones, MD   2 Units at 06/06/22 1105   magnesium hydroxide (MILK OF MAGNESIA) suspension 30 mL  30 mL Oral Daily PRN Dannan, Espinal, RPH       memantine [facility]) tablet 10 mg  10 mg Oral BID Mansy, Arvella Merles, MD  10 mg at 06/06/22 1059   metoprolol succinate (TOPROL-XL) 24 hr tablet 100 mg  100 mg Oral BID Mansy, Jan A, MD   100 mg at 06/06/22 1101   ondansetron (ZOFRAN) tablet 4 mg  4 mg Oral Q6H PRN Mansy, Jan A, MD       Or   ondansetron [facility] injection 4 mg  4 mg Intravenous Q6H PRN Mansy, Jan A, MD       pantoprazole (PROTONIX) EC tablet 40 mg  40 mg Oral Daily Mansy, Jan A, MD   40 mg at 06/06/22 1059   potassium chloride SA (KLOR-CON M) CR tablet 60 mEq  60 mEq Oral Once Gwynne Edinger, MD       prednisoLONE acetate (PRED FORTE) 1 % ophthalmic suspension 1 drop  1 drop Left Eye Daily Mansy, Jan A, MD   1 drop at 06/06/22 1103   [START ON 06/07/2022] predniSONE (DELTASONE) tablet 40 mg  40 mg Oral Daily Wouk, Ailene Rud, MD       rOPINIRole (REQUIP) tablet 2.5 mg  2.5 mg Oral QHS Mansy, Jan A, MD   2.5 mg at 06/05/22 2233   tiZANidine (ZANAFLEX) tablet 2 mg  2 mg Oral Daily Mansy, Jan A, MD   2 mg at 06/06/22 1103   traMADol (ULTRAM) tablet 50-100 mg  50-100 mg Oral QHS PRN Mansy, Jan A, MD   100 mg at 06/05/22 2233   traZODone (DESYREL) tablet 25 mg  25 mg Oral QHS PRN Mansy, Jan A, MD   25 mg at 06/05/22 2234   Vitamin D (Ergocalciferol) (DRISDOL) 1.25 MG (50000  UNIT) capsule 50,000 Units  50,000 Units Oral Q7 days Mansy, Jan A, MD       zinc sulfate capsule 220 mg  220 mg Oral Daily Mansy, Jan A, MD   220 mg at 06/06/22 1101     Discharge Medications: Please see discharge summary for a list of discharge medications.  Relevant Imaging Results:  Relevant Lab Results:   Additional Information SS#: [id number]. Tested positive for COVID on 2/25. Day 10 will be 3/6.  Candie Chroman, LCSW

## 2022-06-07 DIAGNOSIS — A4189 Other specified sepsis: Secondary | ICD-10-CM | POA: Diagnosis not present

## 2022-06-07 DIAGNOSIS — U071 COVID-19: Secondary | ICD-10-CM | POA: Diagnosis not present

## 2022-06-07 LAB — GLUCOSE, CAPILLARY
Glucose-Capillary: 129 mg/dL — ABNORMAL HIGH (ref 70–99)
Glucose-Capillary: 180 mg/dL — ABNORMAL HIGH (ref 70–99)
Glucose-Capillary: 263 mg/dL — ABNORMAL HIGH (ref 70–99)
Glucose-Capillary: 166 mg/dL — ABNORMAL HIGH (ref 70–99)

## 2022-06-07 LAB — BASIC METABOLIC PANEL
Anion gap: 8 (ref 5–15)
BUN: 36 mg/dL — ABNORMAL HIGH (ref 8–23)
CO2: 37 mmol/L — ABNORMAL HIGH (ref 22–32)
Calcium: 8.9 mg/dL (ref 8.9–10.3)
Chloride: 98 mmol/L (ref 98–111)
Creatinine, Ser: 1.09 mg/dL — ABNORMAL HIGH (ref 0.44–1.00)
GFR, Estimated: 48 mL/min — ABNORMAL LOW (ref >60)
Glucose, Bld: 142 mg/dL — ABNORMAL HIGH (ref 70–99)
Potassium: 4.2 mmol/L (ref 3.5–5.1)
Sodium: 143 mmol/L (ref 135–145)

## 2022-06-07 NOTE — Evaluation (Signed)
Occupational Therapy Evaluation Patient Details Name: [name] MRN: [id number] DOB: 12/03/1929 Today's Date: 06/07/2022   History of Present Illness presented to ER secondary to progressive weakness, inability to walk and fever; admitted for mangement of sepsis, acute respiratory failure with hypoxia related to COVID-19   Clinical Impression   [name] presents with generalized weakness, limited endurance, and SOB, 2/2 Covid. She lives alone in a single-story apartment, has a personal care aide 4 hrs/day M-F, who assists her with bathing, cooking, shopping, cleaning. Pt has 3 daughters and 2 granddaughters; they share overnight duties at pt's house, so that someone is staying with the pt every evening. Pt receives Meals on Wheels 5 days/wk. At baseline she ambulates with a rollator, denies any recent falls history, and does not use supplemental O2. During today's evaluation, pt is able to perform bed mobility, transfers, ambulation w/ RW, with Mod I-SUPV, while keeping O2 sats at 90% or above (largely 95% or above) on 5L O2. O2 sats do drop into mid 80s when pt performs shampooing/brushing hair vigorously while seated EOB, but recover quickly with cueing for PLB. Pt is able to perform therex in standing for ~ 5 minutes w/o drop in O2 and w/o LOB. Reduced O2 to 4L w/ pt still able to maintain O2 sats; pt denies any feeling of SOB. Pt left in recliner, call bell w/in reach, RN informed. Pt has made good progress in previous few days, do not anticipate at this point that she will need STR. Recommend DC home with [place].   Recommendations for follow up therapy are one component of a multi-disciplinary discharge planning process, led by the attending physician.  Recommendations may be updated based on patient status, additional functional criteria and insurance authorization.   Follow Up Recommendations  Home health OT     Assistance Recommended at Discharge Intermittent Supervision/Assistance   Patient can return home with the following A little help with bathing/dressing/bathroom;Assistance with cooking/housework;Assist for transportation    Functional Status Assessment  Patient has had a recent decline in their functional status and demonstrates the ability to make significant improvements in function in a reasonable and predictable amount of time.  Equipment Recommendations  None recommended by OT    Recommendations for Other Services       Precautions / Restrictions Precautions Precautions: Fall Restrictions Weight Bearing Restrictions: No      Mobility Bed Mobility Overal bed mobility: Modified Independent Bed Mobility: Sit to Supine       Sit to supine: Modified independent (Device/Increase time)   General bed mobility comments: Increased time/effort but no physical assist required    Transfers Overall transfer level: Needs assistance Equipment used: Rolling walker (2 wheels) Transfers: Sit to/from Stand, Bed to chair/wheelchair/BSC Sit to Stand: Supervision           General transfer comment: Increased time/effort but no physical assist required. Able to perform sit<>stand x 3 while keeping O2 sats 90% or above      Balance Overall balance assessment: Needs assistance Sitting-balance support: Feet supported Sitting balance-Leahy Scale: Good     Standing balance support: Bilateral upper extremity supported, During functional activity, Reliant on assistive device for balance Standing balance-Leahy Scale: Good Standing balance comment: able to perform standing therex w/ no LOB, no significant drop in O2 sats                           ADL either performed or assessed with clinical  judgement   ADL Overall ADL's : Needs assistance/impaired Eating/Feeding: Modified independent;Sitting   Grooming: Wash/dry hands;Oral care;Brushing hair;Sitting                               Functional mobility during ADLs: Rolling walker  (2 wheels);Supervision/safety       Vision         Perception     Praxis      Pertinent Vitals/Pain Pain Assessment Pain Assessment: No/denies pain     Hand Dominance Right   Extremity/Trunk Assessment Upper Extremity Assessment Upper Extremity Assessment: Generalized weakness   Lower Extremity Assessment Lower Extremity Assessment: Generalized weakness   Cervical / Trunk Assessment Cervical / Trunk Assessment: Normal   Communication Communication Communication: HOH   Cognition Arousal/Alertness: Awake/alert Behavior During Therapy: WFL for tasks assessed/performed Overall Cognitive Status: Within Functional Limits for tasks assessed                                       General Comments       Exercises Other Exercises Other Exercises: Educ re: HEP, DC recs, safety awareness   Shoulder Instructions      Home Living Family/patient expects to be discharged to:: Private residence Living Arrangements: Alone Available Help at Discharge: Family;Personal care attendant;Available PRN/intermittently Type of Home: Apartment Home Access: Level entry     Home Layout: One level     Bathroom Shower/Tub: Teacher, early years/pre: Standard     Home Equipment: Rollator (4 wheels)   Additional Comments: has private caregiver 4 hrs/day, 5 days/week; has someone overnight with her every night      Prior Functioning/Environment Prior Level of Function : Needs assist             Mobility Comments: SUPV/Mod I for household ambulation w/ rollator, no home O2, no recent falls ADLs Comments: Caregiver assists with bathing, toileting, cooking, cleaning, shopping. [employer] on [employer]. A daugther or granddaughter stays with pt overnight.        OT Problem List: Decreased strength;Decreased activity tolerance;Impaired balance (sitting and/or standing);Cardiopulmonary status limiting activity      OT Treatment/Interventions:  Self-care/ADL training;Patient/family education;Balance training;Energy conservation;Therapeutic activities;DME and/or AE instruction    OT Goals(Current goals can be found in the care plan section) Acute Rehab OT Goals Patient Stated Goal: to get home soon and have a birthday party OT Goal Formulation: With patient Time For Goal Achievement: 06/21/22 Potential to Achieve Goals: Good ADL Goals Pt Will Perform Grooming: standing;sitting;with supervision (in sitting or standing, for 5+ minutes, without becoming SOB) Pt Will Transfer to Toilet: ambulating;regular height toilet;with supervision (ambulating to/from bathroom w/ RW and w/o becoming SOB) Pt/caregiver will Perform Home Exercise Program: Increased ROM;Increased strength (to increase strength, endurance, stamina, for return to PLOF)  OT Frequency: Min 2X/week    Co-evaluation              AM-PAC OT "6 Clicks" Daily Activity     Outcome Measure Help from another person eating meals?: None Help from another person taking care of personal grooming?: A Little Help from another person toileting, which includes using toliet, bedpan, or urinal?: A Little Help from another person bathing (including washing, rinsing, drying)?: A Lot Help from another person to put on and taking off regular upper body clothing?: None Help from another person to  put on and taking off regular lower body clothing?: A Little 6 Click Score: 19   End of Session Equipment Utilized During Treatment: Rolling walker (2 wheels) Nurse Communication: Mobility status  Activity Tolerance: Patient tolerated treatment well Patient left: in chair;with call bell/phone within reach  OT Visit Diagnosis: Unsteadiness on feet (R26.81);Muscle weakness (generalized) (M62.81)                Time: [id number] OT Time Calculation (min): 31 min Charges:  OT General Charges $OT Visit: 1 Visit OT Evaluation $OT Eval Low Complexity: 1 Low OT Treatments $Self Care/Home  Management : 23-37 mins Josiah Lobo, PhD, MS, OTR/L 06/07/22, 10:10 AM

## 2022-06-07 NOTE — Consult Note (Signed)
..[name], [name] [id number] Jan 16, 1930 Gwynne Edinger, MD  Reason for Consult: foreign body in ear  HPI: 89 year old female admitted for COVID sepsis known to [facility] ENT with hearing loss.  Called emergently on 1/29 at 9:41 p.m. for evaluation of hearing aid part in ear.  Per report patient can feel piece in ear.  No significant ear pain.  No drainage.  Patient has baseline decreased hearing and no significant change.  She has prior history of hearing aid pieces breaking off into ear.  Allergies:  Allergies  Allergen Reactions   Penicillins Rash and Other (See Comments)    Childhood Allergy Has patient had a PCN reaction causing immediate rash, facial/tongue/throat swelling, SOB or lightheadedness with hypotension: Yes Has patient had a PCN reaction causing severe rash involving mucus membranes or skin necrosis: Unknown Has patient had a PCN reaction that required hospitalization: No Has patient had a PCN reaction occurring within the last 10 years: No If all of the above answers are "NO", then may proceed with Cephalosporin use.     ROS: Review of systems normal other than 12 systems except per HPI.  PMH:  Past Medical History:  Diagnosis Date   Abdominal aortic atherosclerosis ([place]) 09/2015   by xray   Anxiety    BCC (basal cell carcinoma of skin) 05/2011   on nose   BCC (basal cell carcinoma of skin) 08/09/2020   upper chest, [facility] 12/27/20   Breast lesion    a. diagnosed with complex sclerosing lesion with calcifications of the right breast in 08/2016   Chest pain    a. nuclear stress test 03/2012: normal; b. 09/2018 MV: EF >65%, no isch/scar; c. 07/2019 Cath: LM nl, LAD 40ost, 47m LCX nl, RCA 50ost/p (iFR 0.98). EF 55-65% ->Med Rx.   DDD (degenerative disc disease), lumbosacral 12/06/1992   Gallstones 09/2015   incidentally by xray   GERD (gastroesophageal reflux disease) 05/1999   HERPES ZOSTER 04/13/2007   Qualifier: Diagnosis of  By: BMaxie BetterFNP, BRosalita Levan    Hiatal hernia    History of shingles    ophthalmic, takes acyclovir daily   Hyperlipidemia 12/2003   Hypertension 1990   Mitral regurgitation    a. echo 03/2012: EF 60-65%, no RWMA, mild to mod AI/MR, mod TR, PASP 37 mmHg; b. 05/2019 Echo: EF 60-65%, mod MR.   Osteoarthritis 08/21/1989   Osteoporosis with fracture 11/1997   with compression fracture T12   PAD (peripheral artery disease) ([place]    a. 01/2020 PTA of R Peroneal and R PT; b. 09/2021 ABI/TBI: R 1.04/09.2, L 1.36/0.17.   Permanent atrial fibrillation ([place] 03/16/2012   a. CHADS2VASc => 5 (HTN, age x 2, vascular disease, sex category)-->Eliquis 5 BID;  b. Recurrent AF in 06/2016;  c. 01/2017 s/p DCCV;  d. 02/2017 recurrent AFib->Amio added->DCCV; e. 03/2017 recurrent AFib, s/p reload of amio and DCCV; f. 12/2020 DCCV-->recurrent Afib, Amio d/c-->rate control.   POSTHERPETIC NEURALGIA 04/20/2007   Qualifier: Diagnosis of  By: BMaxie BetterFNP, BRosalita Levan   Rheumatoid arthritis ([place]    RLS (restless legs syndrome)    SCC (squamous cell carcinoma) 05/01/2021   right posterior flank inferior to axilla - well differentiated squamous cell carcinoma with superificial infiltration, base involved. EDC 06/06/2021   Squamous cell carcinoma in situ 04/17/2022   Right dorsal hand, proximal. SCCis, inverted. Excised 05/08/2022   Squamous cell carcinoma of skin 12/19/2021   [facility]ximal right dorsal [facility]. [facility]   Squamous cell  carcinoma of skin 12/19/2021   Distal right dorsal hand. KA type. [facility] 12/19/2021    FH:  Family History  Problem Relation Age of Onset   Emphysema Mother    Heart disease Father        MI   Stroke Father        first at age 35.   Stroke Sister    Diabetes Sister    Hypertension Brother    Heart disease Brother        Heart Valve   Stroke Brother    Diabetes Brother    Cancer Brother        esophageal   Diabetes Brother    Diabetes Brother    Colon cancer Neg Hx     SH:  Social History    Socioeconomic History   Marital status: Widowed    Spouse name: Not on file   Number of children: 5   Years of education: Not on file   Highest education level: Not on file  Occupational History   Occupation: Retired - Occupational psychologist: RETIRED  Tobacco Use   Smoking status: Former    Packs/day: 0.25    Years: 1.00    Total pack years: 0.25    Types: Cigarettes    Quit date: 04/08/1976    Years since quitting: 46.1   Smokeless tobacco: Never   Tobacco comments:    pt smokes occasionally "socially"  Vaping Use   Vaping Use: Never used  Substance and Sexual Activity   Alcohol use: No    Alcohol/week: 1.0 standard drink of alcohol    Types: 1 Glasses of wine per week   Drug use: No   Sexual activity: Never  Other Topics Concern   Not on file  Social History Narrative   Lives with grandson who smokes, dog   From [place].  Widowed 01/1999; 1st marriage 25 years / 2nd marriage 13 years.   One son deceased 02-11-23.   Activity: active with dog   Diet: good water, fruits/vegetables daily   Social Determinants of Health   Financial Resource Strain: Low Risk  (08/27/2021)   Overall Financial Resource Strain (CARDIA)    Difficulty of Paying Living Expenses: Not very hard  Food Insecurity: No Food Insecurity (06/03/2022)   Hunger Vital Sign    Worried About Running Out of Food in the Last Year: Never true    Ran Out of Food in the Last Year: Never true  Transportation Needs: No Transportation Needs (06/03/2022)   PRAPARE - Hydrologist (Medical): No    Lack of Transportation (Non-Medical): No  Physical Activity: Inactive (08/27/2021)   Exercise Vital Sign    Days of Exercise per Week: 0 days    Minutes of Exercise per Session: 0 min  Stress: No Stress Concern Present (08/27/2021)   [place]    Feeling of Stress : Only a little  Social Connections: Socially Isolated (08/27/2021)    Social Connection and Isolation Panel [NHANES]    Frequency of Communication with Friends and Family: More than three times a week    Frequency of Social Gatherings with Friends and Family: More than three times a week    Attends Religious Services: Never    Marine scientist or Organizations: No    Attends Archivist Meetings: Never    Marital Status: Widowed  Intimate Partner Violence: Not At Risk (06/03/2022)  Humiliation, Afraid, Rape, and Kick questionnaire    Fear of Current or Ex-Partner: No    Emotionally Abused: No    Physically Abused: No    Sexually Abused: No    PSH:  Past Surgical History:  Procedure Laterality Date   ABDOMINAL HYSTERECTOMY  Age 74 - 40   S/P TAH   abdominal ultrasound  04/23/2004   Gallstones   BREAST EXCISIONAL BIOPSY Right 2018   neg/benign complex sclerosing lesion   BREAST LUMPECTOMY WITH RADIOACTIVE SEED LOCALIZATION Right 10/2016   breast lumpectomy with radioactive seed localization and margin assessment for complex sclerosing lesion ([place])   BREAST LUMPECTOMY WITH RADIOACTIVE SEED LOCALIZATION Right 11/05/2016   Procedure: RIGHT BREAST LUMPECTOMY WITH RADIOACTIVE SEED LOCALIZATION;  Surgeon: Fanny Skates, MD;  Location: [place];  Service: General;  Laterality: Right;   CARDIAC CATHETERIZATION     CARDIOVERSION N/A 01/31/2017   Procedure: CARDIOVERSION;  Surgeon: Wellington [place], MD;  Location: [facility];  Service: Cardiovascular;  Laterality: N/A;   CARDIOVERSION N/A 03/03/2017   Procedure: CARDIOVERSION;  Surgeon: Wellington [place], MD;  Location: [facility];  Service: Cardiovascular;  Laterality: N/A;   CARDIOVERSION N/A 04/02/2017   Procedure: CARDIOVERSION;  Surgeon: Wellington [place], MD;  Location: [facility];  Service: Cardiovascular;  Laterality: N/A;   CARDIOVERSION N/A 01/16/2018   Procedure: CARDIOVERSION;  Surgeon: Wellington [place], MD;  Location: [facility];  Service: Cardiovascular;   Laterality: N/A;   CARDIOVERSION N/A 12/25/2020   Procedure: CARDIOVERSION;  Surgeon: Wellington [place], MD;  Location: [facility];  Service: Cardiovascular;  Laterality: N/A;   CATARACT EXTRACTION  05/2010   left eye   CERVICAL DISCECTOMY  1992   Fusion (Dr. Joya Salm)   ERCP N/A 10/30/2020   Procedure: ENDOSCOPIC RETROGRADE CHOLANGIOPANCREATOGRAPHY (ERCP);  Surgeon: Jackquline Denmark, MD;  Location: [facility];  Service: Endoscopy;  Laterality: N/A;   ESI Bilateral 01/2016   S1 transforaminal ESI x3   ESOPHAGOGASTRODUODENOSCOPY  01/01/2002   with ulcer, bx. neg; + stricture gastric ulcer with hemorrhage   ESOPHAGOGASTRODUODENOSCOPY  04/23/2004   Esophageal stricture -- dilated   INTRAVASCULAR PRESSURE WIRE/FFR STUDY N/A 07/12/2019   Procedure: INTRAVASCULAR PRESSURE WIRE/FFR STUDY;  Surgeon: Wellington [place], MD;  Location: [place] [facility];  Service: Cardiovascular;  Laterality: N/A;   LOWER EXTREMITY ANGIOGRAPHY Left 02/01/2019   LOWER EXTREMITY ANGIOGRAPHY;  Surgeon: Algernon Huxley, MD   LOWER EXTREMITY ANGIOGRAPHY Right 03/25/2019   Procedure: LOWER EXTREMITY ANGIOGRAPHY;  Surgeon: Algernon Huxley, MD;  Location: [place] [facility];  Service: Cardiovascular;  Laterality: Right;   LOWER EXTREMITY ANGIOGRAPHY Right 08/02/2019   Procedure: LOWER EXTREMITY ANGIOGRAPHY;  Surgeon: Algernon Huxley, MD;  Location: [place] [facility];  Service: Cardiovascular;  Laterality: Right;   nuclear stress test  03/2012   no ischemia   PERIPHERAL VASCULAR BALLOON ANGIOPLASTY Left 01/2019   Percutaneous transluminal angioplasty of left anterior and posterior tibial artery with 2.5 mm diameter by 22 cm length angioplasty balloon ([place])   REMOVAL OF STONES  10/30/2020   Procedure: REMOVAL OF STONES;  Surgeon: Jackquline Denmark, MD;  Location: [facility] ENDOSCOPY;  Service: Endoscopy;;   RIGHT/LEFT HEART CATH AND CORONARY ANGIOGRAPHY Bilateral 07/12/2019   Procedure: RIGHT/LEFT HEART CATH AND CORONARY ANGIOGRAPHY;  Surgeon:  Wellington [place], MD;  Location: [place] [facility];  Service: Cardiovascular;  Laterality: Bilateral;   SKIN CANCER EXCISION  05/20/2011   BCC from nose   SPHINCTEROTOMY  10/30/2020   Procedure:  SPHINCTEROTOMY;  Surgeon: Jackquline Denmark, MD;  Location: [facility] ENDOSCOPY;  Service: Endoscopy;;   US ECHOCARDIOGRAPHY  03/2012   in flutter, EF 60%, mild-mod aortic, mitral, tricuspid regurg, mildly dilated LA    Physical  Exam:  GEN-  sleeping soundly but aroused easily NEURO- CN 2-12 grossly intact and symmetric decreased hearing NOSE- clear anteriorly, [place] in place OC/OP- no masses or lesions EARS-  small amount of wax bilaterally no foreign body seen, TMs intact with no perforation or effusion NECK- supple with no LAD RESP- unlabored   A/P: No foreign body in ear with normal ear exam.  Will have office contact patient regarding fixing of bell on right hearing aid so she can use it again.  Plan:  Follow up as needed.   Carloyn Manner 06/07/2022 8:27 AM

## 2022-06-07 NOTE — Progress Notes (Signed)
Progress Note   [name] [id number] DOB: 11-25-1929 DOA: 06/02/2022     4 DOS: the patient was seen and examined on 06/07/2022   Brief hospital course: [name] is a 89 y.o. Caucasian female with medical history significant for hypertension, dyslipidemia, rheumatoid arthritis, RLS,, who presented to emergency room with acute onset of generalized weakness with inability to walk.  She had temperature 101.7, heart rate 94, she was hypoxemic with 89% on room air, was placed on 2 L oxygen.  Her blood pressure was also low, she received fluid bolus.  COVID came back positive.  Patient treated with steroids and Paxlovid. Patient developed significant respiratory distress early morning on 2/27, diagnosed with acute on chronic diastolic congestive heart failure, started on IV Lasix.    Principal Problem:   Sepsis due to COVID-19 [facility]) Active Problems:   Hypokalemia   Generalized weakness   Essential hypertension   Persistent atrial fibrillation (HCC)   Peripheral neuropathy   Acute respiratory failure with hypoxia (HCC)   Acute on chronic diastolic CHF (congestive heart failure) (HCC)   Dyslipidemia   GERD without esophagitis   Hypotension   Moderate tricuspid regurgitation   Assessment and Plan: * Sepsis due to COVID-19 [facility]) Acute hypoxemic respite failure secondary to COVID infection. Diastolic congestive heart failure Moderate tricuspid regurgitation. Treated with Paxlovid and IV steroids.  She developed volume overload with severe respiratory distress on the morning of 2/27, chest x-ray showed evidence of volume overload.  BNP elevated.   Reviewed echocardiogram performed in 04/2022, ejection fraction 50 to 55%, moderate tricuspid regurgitation, diastolic function indeterminant. Stop lasix given bump in creatinine and bun Have stopped paxlovid given hypoxia Cont [place] O2, currently 2 liters, breathing comfortably, down from 5 yesterday  Demand ischemia Mild  trop elevation yesterday, 90s>80s, asymptomatic   Essential hypertension Controlled on current regimen   Hypokalemia repleting   Generalized weakness Pt advising snf, have consulted toc, per toc earliest d/c date would be 3/6   GERD without esophagitis PPI.   Dyslipidemia - We will continue statin therapy.   Peripheral neuropathy  We will continue Neurontin.  Hyperglycemia 2/2 steroids - continue SSI, carb modified diet  A-fib, permanent - cont apixaban      Subjective:  Dyspnea much improved, tolerating diet   Physical Exam: Vitals:   06/07/22 0500 06/07/22 0820 06/07/22 1012 06/07/22 1145  BP:  (!) 124/90  98/63  Pulse:  (!) 102  81  Resp:  18  16  Temp:  98.2 F (36.8 C)  97.7 F (36.5 C)  TempSrc:  Oral  Oral  SpO2:  96% 100% 98%  Weight: 68 kg     Height:       General exam: Appears calm and comfortable  Respiratory system: Coarse breath sounds with crackles in the bases bilaterally. Respiratory effort normal. Cardiovascular system: S1 & S2 heard, RRR. No JVD, murmurs, rubs, gallops or clicks. No pedal edema. Gastrointestinal system: Abdomen is nondistended, soft and nontender. No organomegaly or masses felt. Normal bowel sounds heard. Central nervous system: Alert and oriented x3. No focal neurological deficits. Extremities: Symmetric 5 x 5 power. Skin: No rashes, lesions or ulcers Psychiatry: Mood & affect appropriate.    Data Reviewed:  There are no new results to review at this time.  Family Communication: Daughter updated at bedside 3/1  Disposition: Status is: Inpatient Remains inpatient appropriate because: needs to complete 10 day quarantine before going to snf Dispo: SNF    Time  spent: 35 minutes  Author: Desma Maxim, MD 06/07/2022 2:33 PM  For on call review [email or web address].

## 2022-06-08 DIAGNOSIS — A4189 Other specified sepsis: Secondary | ICD-10-CM | POA: Diagnosis not present

## 2022-06-08 DIAGNOSIS — U071 COVID-19: Secondary | ICD-10-CM | POA: Diagnosis not present

## 2022-06-08 LAB — BASIC METABOLIC PANEL
Anion gap: 9 (ref 5–15)
BUN: 43 mg/dL — ABNORMAL HIGH (ref 8–23)
CO2: 32 mmol/L (ref 22–32)
Calcium: 8.9 mg/dL (ref 8.9–10.3)
Chloride: 97 mmol/L — ABNORMAL LOW (ref 98–111)
Creatinine, Ser: 0.97 mg/dL (ref 0.44–1.00)
GFR, Estimated: 55 mL/min — ABNORMAL LOW (ref >60)
Glucose, Bld: 107 mg/dL — ABNORMAL HIGH (ref 70–99)
Potassium: 3.7 mmol/L (ref 3.5–5.1)
Sodium: 138 mmol/L (ref 135–145)

## 2022-06-08 LAB — CULTURE, BLOOD (ROUTINE X 2)
Culture: NO GROWTH
Culture: NO GROWTH
Special Requests: ADEQUATE

## 2022-06-08 LAB — GLUCOSE, CAPILLARY
Glucose-Capillary: 109 mg/dL — ABNORMAL HIGH (ref 70–99)
Glucose-Capillary: 135 mg/dL — ABNORMAL HIGH (ref 70–99)
Glucose-Capillary: 169 mg/dL — ABNORMAL HIGH (ref 70–99)
Glucose-Capillary: 263 mg/dL — ABNORMAL HIGH (ref 70–99)

## 2022-06-08 NOTE — Progress Notes (Signed)
Progress Note   [name] [id number] DOB: 1929-08-29 DOA: 06/02/2022     5 DOS: the patient was seen and examined on 06/08/2022   Brief hospital course: [name] is a 89 y.o. Caucasian female with medical history significant for hypertension, dyslipidemia, rheumatoid arthritis, RLS,, who presented to emergency room with acute onset of generalized weakness with inability to walk.  She had temperature 101.7, heart rate 94, she was hypoxemic with 89% on room air, was placed on 2 L oxygen.  Her blood pressure was also low, she received fluid bolus.  COVID came back positive.  Patient treated with steroids and Paxlovid. Patient developed significant respiratory distress early morning on 2/27, diagnosed with acute on chronic diastolic congestive heart failure, started on IV Lasix.    Principal Problem:   Sepsis due to COVID-19 [facility]) Active Problems:   Hypokalemia   Generalized weakness   Essential hypertension   Persistent atrial fibrillation (HCC)   Peripheral neuropathy   Acute respiratory failure with hypoxia (HCC)   Acute on chronic diastolic CHF (congestive heart failure) (HCC)   Dyslipidemia   GERD without esophagitis   Hypotension   Moderate tricuspid regurgitation   Assessment and Plan: * Sepsis due to COVID-19 [facility]) Acute hypoxemic respite failure secondary to COVID infection. Diastolic congestive heart failure Moderate tricuspid regurgitation. Treated with Paxlovid and IV steroids.  She developed volume overload with severe respiratory distress on the morning of 2/27, chest x-ray showed evidence of volume overload.  BNP elevated.   Reviewed echocardiogram performed in 04/2022, ejection fraction 50 to 55%, moderate tricuspid regurgitation, diastolic function indeterminant. Stop lasix given bump in creatinine and bun Have stopped paxlovid given hypoxia Now weaned o2 as of this morning  Demand ischemia Mild trop elevation in setting of acute illness,  trops 90s>80s, asymptomatic   Essential hypertension Controlled on current regimen   Hypokalemia Wnl today, monitor   Generalized weakness Pt advising snf, have consulted toc, per toc earliest d/c date would be 3/6   GERD without esophagitis PPI.   Dyslipidemia We will continue statin therapy.   Peripheral neuropathy  We will continue Neurontin.  Hyperglycemia 2/2 steroids - continue SSI, carb modified diet  A-fib, permanent - cont apixaban      Subjective:  Dyspnea much improved, tolerating diet, had BM today  Physical Exam: Vitals:   06/07/22 2350 06/08/22 0500 06/08/22 0741 06/08/22 1115  BP: 118/88 123/80 125/78 (!) 137/91  Pulse: 99 85 77 (!) 119  Resp: '16 17 16   '$ Temp: 97.8 F (36.6 C) 97.8 F (36.6 C) 98 F (36.7 C) 98 F (36.7 C)  TempSrc: Oral Oral  Oral  SpO2: 95% 96% 93% 93%  Weight:      Height:       General exam: Appears calm and comfortable  Respiratory system: Coarse breath sounds with crackles in the bases bilaterally. Respiratory effort normal. Cardiovascular system: S1 & S2 heard, RRR. No JVD, murmurs, rubs, gallops or clicks. No pedal edema. Gastrointestinal system: Abdomen is nondistended, soft and nontender. No organomegaly or masses felt. Normal bowel sounds heard. Central nervous system: Alert and oriented x3. No focal neurological deficits. Extremities: Symmetric 5 x 5 power. Skin: No rashes, lesions or ulcers Psychiatry: Mood & affect appropriate.    Data Reviewed:  There are no new results to review at this time.  Family Communication: Daughter updated at bedside 3/2  Disposition: Status is: Inpatient Remains inpatient appropriate because: needs to complete 10 day quarantine before going  to snf Dispo: SNF    Time spent: 25 minutes  Author: Desma Maxim, MD 06/08/2022 12:44 PM  For on call review [email or web address].

## 2022-06-09 DIAGNOSIS — U071 COVID-19: Secondary | ICD-10-CM | POA: Diagnosis not present

## 2022-06-09 DIAGNOSIS — A4189 Other specified sepsis: Secondary | ICD-10-CM | POA: Diagnosis not present

## 2022-06-09 LAB — GLUCOSE, CAPILLARY
Glucose-Capillary: 98 mg/dL (ref 70–99)
Glucose-Capillary: 89 mg/dL (ref 70–99)
Glucose-Capillary: 108 mg/dL — ABNORMAL HIGH (ref 70–99)
Glucose-Capillary: 181 mg/dL — ABNORMAL HIGH (ref 70–99)

## 2022-06-09 NOTE — Progress Notes (Addendum)
Progress Note   Patient: MANHATTAN CONSTANCIO [id number] DOB: 1930/01/10 DOA: 06/02/2022     6 DOS: the patient was seen and examined on 06/09/2022   Brief hospital course: JASIE FUCCI is a 89 y.o. Caucasian female with medical history significant for hypertension, dyslipidemia, rheumatoid arthritis, RLS,, who presented to emergency room with acute onset of generalized weakness with inability to walk.  She had temperature 101.7, heart rate 94, she was hypoxemic with 89% on room air, was placed on 2 L oxygen.  Her blood pressure was also low, she received fluid bolus.  COVID came back positive.  Patient treated with steroids and Paxlovid. Patient developed significant respiratory distress early morning on 2/27, diagnosed with acute on chronic diastolic congestive heart failure, started on IV Lasix.    Principal Problem:   Sepsis due to COVID-19 [facility]) Active Problems:   Hypokalemia   Generalized weakness   Essential hypertension   Persistent atrial fibrillation (HCC)   Peripheral neuropathy   Acute respiratory failure with hypoxia (HCC)   Acute on chronic diastolic CHF (congestive heart failure) (HCC)   Dyslipidemia   GERD without esophagitis   Hypotension   Moderate tricuspid regurgitation   Assessment and Plan: * Sepsis due to COVID-19 [facility]) Acute hypoxemic respite failure secondary to COVID infection. Diastolic congestive heart failure Moderate tricuspid regurgitation. Treated with Paxlovid and IV steroids.  She developed volume overload with severe respiratory distress on the morning of 2/27, chest x-ray showed evidence of volume overload.  BNP elevated.   Reviewed echocardiogram performed in 04/2022, ejection fraction 50 to 55%, moderate tricuspid regurgitation, diastolic function indeterminant. Stop lasix given bump in creatinine and bun Have stopped paxlovid given hypoxia Now weaned o2 as of this morning  Demand ischemia Mild trop elevation in setting of acute illness,  trops 90s>80s, asymptomatic   Essential hypertension Controlled on current regimen   Hypokalemia Wnl today, monitor   Generalized weakness Pt advising snf, have consulted toc, per toc earliest d/c date would be 3/6   GERD without esophagitis PPI.   Dyslipidemia We will continue statin therapy.   Peripheral neuropathy  We will continue Neurontin.  Hyperglycemia 2/2 steroids, resolved - stop ssi, will monitor daily fasting sugars  A-fib, permanent - cont apixaban      Subjective:  Tolerating diet, no complaints  Physical Exam: Vitals:   06/09/22 0031 06/09/22 0554 06/09/22 0845 06/09/22 1241  BP: (!) 139/101 139/89 (!) 147/95 126/77  Pulse: 95 (!) 48 93 83  Resp: '18 16 16 16  '$ Temp: 98.7 F (37.1 C) 98 F (36.7 C) 98.2 F (36.8 C) 97.8 F (36.6 C)  TempSrc:   Oral Oral  SpO2: 95% 93% 96% 96%  Weight:  69.5 kg    Height:       General exam: Appears calm and comfortable  Respiratory system: Coarse breath sounds with crackles in the bases bilaterally. Respiratory effort normal. Cardiovascular system: S1 & S2 heard, RRR. No JVD, murmurs, rubs, gallops or clicks. No pedal edema. Gastrointestinal system: Abdomen is nondistended, soft and nontender. No organomegaly or masses felt. Normal bowel sounds heard. Central nervous system: Alert and oriented x3. No focal neurological deficits. Extremities: Symmetric 5 x 5 power. Skin: No rashes, lesions or ulcers Psychiatry: Mood & affect appropriate.    Data Reviewed:  There are no new results to review at this time.  Family Communication: Daughter updated telephonically 3/3  Disposition: Status is: Inpatient Remains inpatient appropriate because: needs to complete 10 day quarantine before going  to snf Dispo: SNF    Time spent: 25 minutes  Author: Desma Maxim, MD 06/09/2022 1:42 PM  For on call review [email or web address].

## 2022-06-10 ENCOUNTER — Telehealth: Payer: Self-pay | Admitting: Family Medicine

## 2022-06-10 DIAGNOSIS — A4189 Other specified sepsis: Secondary | ICD-10-CM | POA: Diagnosis not present

## 2022-06-10 DIAGNOSIS — U071 COVID-19: Secondary | ICD-10-CM | POA: Diagnosis not present

## 2022-06-10 LAB — GLUCOSE, CAPILLARY: Glucose-Capillary: 119 mg/dL — ABNORMAL HIGH (ref 70–99)

## 2022-06-10 MED ORDER — METOPROLOL TARTRATE 5 MG/5ML IV SOLN
5.0000 mg | Freq: Once | INTRAVENOUS | Status: DC
  Filled 2022-06-10: qty 5

## 2022-06-10 NOTE — Progress Notes (Signed)
Physical Therapy Treatment Patient Details Name: [name] MRN: [id number] DOB: 1929/09/03 Today's Date: 06/10/2022   History of Present Illness presented to ER secondary to progressive weakness, inability to walk and fever; admitted for mangement of sepsis, acute respiratory failure with hypoxia related to COVID-19    PT Comments    Pt is making gradual progress towards goals with ability to ambulate further distance in room this date. All mobility performed on RA with sats monitored. Pt reports no dyspnea and is excited to return home. Reports great family support. Recommend continued use of RW and close monitoring with ambulation, with progression of mobility based on endurance. Will continue to progress as able. Updated care team via secure chat.  Recommendations for follow up therapy are one component of a multi-disciplinary discharge planning process, led by the attending physician.  Recommendations may be updated based on patient status, additional functional criteria and insurance authorization.  Follow Up Recommendations  Home health PT Can patient physically be transported by private vehicle: Yes   Assistance Recommended at Discharge Intermittent Supervision/Assistance  Patient can return home with the following A little help with walking and/or transfers;A little help with bathing/dressing/bathroom;Assist for transportation;Help with stairs or ramp for entrance;Assistance with cooking/housework   Equipment Recommendations  None recommended by PT    Recommendations for Other Services       Precautions / Restrictions Precautions Precautions: Fall Restrictions Weight Bearing Restrictions: No     Mobility  Bed Mobility Overal bed mobility: Needs Assistance Bed Mobility: Supine to Sit     Supine to sit: Min guard     General bed mobility comments: cues for sequencing. No SOB symptoms noted. O2 sats at 93% on RA    Transfers Overall transfer level: Needs  assistance Equipment used: Rolling walker (2 wheels) Transfers: Sit to/from Stand, Bed to chair/wheelchair/BSC Sit to Stand: Min assist           General transfer comment: needs assist from low surface. Once standing, does complain of dizziness, quickly improving within a minute    Ambulation/Gait Ambulation/Gait assistance: Min guard Gait Distance (Feet): 40 Feet Assistive device: Rolling walker (2 wheels) Gait Pattern/deviations: Step-through pattern, Decreased step length - right, Decreased step length - left       General Gait Details: very slow gait pattern with RW too far away from body. No LOB noted, however would recommend close supervision for safe progression. O2 sats decreased to 86% on RA. No SOB symptoms. HR increased to 111bpm. Sats quickly improved to 93% with seated rest break   Stairs             Wheelchair Mobility    Modified Rankin (Stroke Patients Only)       Balance Overall balance assessment: Needs assistance Sitting-balance support: Feet supported Sitting balance-Leahy Scale: Good     Standing balance support: Bilateral upper extremity supported, During functional activity, Reliant on assistive device for balance Standing balance-Leahy Scale: Good                              Cognition Arousal/Alertness: Awake/alert Behavior During Therapy: WFL for tasks assessed/performed Overall Cognitive Status: Within Functional Limits for tasks assessed                                 General Comments: pleasant and agreeable to session  Exercises      General Comments        Pertinent Vitals/Pain Pain Assessment Pain Assessment: No/denies pain    Home Living                          Prior Function            PT Goals (current goals can now be found in the care plan section) Acute Rehab PT Goals Patient Stated Goal: to get stronger and return home PT Goal Formulation: With  patient/family Time For Goal Achievement: 06/17/22 Potential to Achieve Goals: Fair Progress towards PT goals: Progressing toward goals    Frequency    Min 2X/week      PT Plan Discharge plan needs to be updated    Co-evaluation              AM-PAC PT "6 Clicks" Mobility   Outcome Measure  Help needed turning from your back to your side while in a flat bed without using bedrails?: A Little Help needed moving from lying on your back to sitting on the side of a flat bed without using bedrails?: A Little Help needed moving to and from a bed to a chair (including a wheelchair)?: A Little Help needed standing up from a chair using your arms (e.g., wheelchair or bedside chair)?: A Little Help needed to walk in hospital room?: A Little Help needed climbing 3-5 steps with a railing? : A Lot 6 Click Score: 17    End of Session   Activity Tolerance: Patient tolerated treatment well Patient left: in chair;with chair alarm set Nurse Communication: Mobility status PT Visit Diagnosis: Muscle weakness (generalized) (M62.81);Difficulty in walking, not elsewhere classified (R26.2);Other abnormalities of gait and mobility (R26.89);Unsteadiness on feet (R26.81)     Time: [id number] PT Time Calculation (min) (ACUTE ONLY): 18 min  Charges:  $Gait Training: 8-22 mins                     Greggory Stallion, PT, DPT, GCS [phone]    Sohum Delillo 06/10/2022, 10:09 AM

## 2022-06-10 NOTE — Telephone Encounter (Signed)
Noted! Thank you

## 2022-06-10 NOTE — TOC Progression Note (Addendum)
Transition of Care [facility]) - Progression Note    Patient Details  Name: [name] MRN: [id number] Date of Birth: 1929/08/20  Transition of Care [facility]) CM/SW [place], LCSW Phone Number: 06/10/2022, 11:04 AM  Clinical Narrative:   Therapy now recommending home health. Left message for [facility] liaison to see if they can still accept referral for PT, OT, RN, aide. Daughter [name] is aware. Tried calling patient in the room but no answer.  12:43 pm: [facility] confirmed they can accept referral.  3:42 pm: Called and updated daughter.  Expected Discharge Plan and Services                                               Social Determinants of Health (SDOH) Interventions SDOH Screenings   Food Insecurity: No Food Insecurity (06/03/2022)  Housing: Low Risk  (06/03/2022)  Transportation Needs: No Transportation Needs (06/03/2022)  Utilities: Not At Risk (06/03/2022)  Alcohol Screen: Low Risk  (08/27/2021)  Depression (PHQ2-9): Low Risk  (08/27/2021)  Financial Resource Strain: Low Risk  (08/27/2021)  Physical Activity: Inactive (08/27/2021)  Social Connections: Socially Isolated (08/27/2021)  Stress: No Stress Concern Present (08/27/2021)  Tobacco Use: Medium Risk (06/05/2022)    Readmission Risk Interventions     No data to display

## 2022-06-10 NOTE — Progress Notes (Addendum)
Progress Note   [name] [id number] DOB: 12/26/1929 DOA: 06/02/2022     7 DOS: the patient was seen and examined on 06/10/2022   Brief hospital course: [name] is a 89 y.o. Caucasian female with medical history significant for hypertension, dyslipidemia, rheumatoid arthritis, RLS,, who presented to emergency room with acute onset of generalized weakness with inability to walk.  She had temperature 101.7, heart rate 94, she was hypoxemic with 89% on room air, was placed on 2 L oxygen.  Her blood pressure was also low, she received fluid bolus.  COVID came back positive.  Patient treated with steroids and Paxlovid. Patient developed significant respiratory distress early morning on 2/27, diagnosed with acute on chronic diastolic congestive heart failure, started on IV Lasix.    Principal Problem:   Sepsis due to COVID-19 [facility]) Active Problems:   Hypokalemia   Generalized weakness   Essential hypertension   Persistent atrial fibrillation (HCC)   Peripheral neuropathy   Acute respiratory failure with hypoxia (HCC)   Acute on chronic diastolic CHF (congestive heart failure) (HCC)   Dyslipidemia   GERD without esophagitis   Hypotension   Moderate tricuspid regurgitation   Assessment and Plan: * Sepsis due to COVID-19 [facility]) Acute hypoxemic respite failure secondary to COVID infection. Diastolic congestive heart failure Moderate tricuspid regurgitation. Treated with Paxlovid and IV steroids.  She developed volume overload with severe respiratory distress on the morning of 2/27, chest x-ray showed evidence of volume overload.  BNP elevated.   Reviewed echocardiogram performed in 04/2022, ejection fraction 50 to 55%, moderate tricuspid regurgitation, diastolic function indeterminant. Have stopped paxlovid given hypoxia Now weaned o2 to room air  Demand ischemia Mild trop elevation in setting of acute illness, trops 90s>80s, asymptomatic   Essential  hypertension Controlled on current regimen   Hypokalemia Wnl today, monitor   Generalized weakness Pt/ot revised recs home with home health, plan on that tomorrow, family can't take today (out of town). Hh pt/ot/rn/aide ordered.   GERD without esophagitis PPI.   Dyslipidemia We will continue statin therapy.   Peripheral neuropathy  We will continue Neurontin.  Hyperglycemia 2/2 steroids, resolved - stop ssi, will monitor daily fasting sugars  A-fib, permanent - cont apixaban      Subjective:  Tolerating diet, no complaints  Physical Exam: Vitals:   06/09/22 2359 06/10/22 0339 06/10/22 0426 06/10/22 0825  BP: 135/87 128/74 128/82 (!) 158/92  Pulse: 83 86 83 93  Resp:   16 18  Temp: 98.2 F (36.8 C) 98 F (36.7 C) 98 F (36.7 C) 98.1 F (36.7 C)  TempSrc: Oral Oral    SpO2: 96% 96% 97% 95%  Weight:      Height:       General exam: Appears calm and comfortable  Respiratory system: Coarse breath sounds with crackles in the bases bilaterally. Respiratory effort normal. Cardiovascular system: S1 & S2 heard, RRR. No JVD, murmurs, rubs, gallops or clicks. No pedal edema. Gastrointestinal system: Abdomen is nondistended, soft and nontender. No organomegaly or masses felt. Normal bowel sounds heard. Central nervous system: Alert and oriented x3. No focal neurological deficits. Extremities: Symmetric 5 x 5 power. Skin: No rashes, lesions or ulcers Psychiatry: Mood & affect appropriate.    Data Reviewed:  There are no new results to review at this time.  Family Communication: Daughter updated telephonically 3/4  Disposition: Status is: Inpatient Remains inpatient appropriate because: unsafe d/c plan, family can't take today Dispo: home w/ home health  Time spent: 25 minutes  Author: Desma Maxim, MD 06/10/2022 11:10 AM  For on call review [email or web address].

## 2022-06-10 NOTE — Care Management Important Message (Signed)
Important Message  Patient Details  Name: [name] MRN: [id number] Date of Birth: [birth year]   Medicare Important Message Given:  Yes     Loann Quill 06/10/2022, 4:06 PM

## 2022-06-10 NOTE — Progress Notes (Signed)
Occupational Therapy Treatment Patient Details Name: MACAYLE CACKOWSKI MRN: [id number] DOB: 1930/02/11 Today's Date: 06/10/2022   History of present illness presented to ER secondary to progressive weakness, inability to walk and fever; admitted for mangement of sepsis, acute respiratory failure with hypoxia related to COVID-19   OT comments  Ms. Luhrsen demonstrated good progress today. She is now on room air and was able to ambulate within room w/ RW while keeping O2 sats largely between 93-96%. She walked to/from the bathroom and was able to get herself up and down from the toilet and manage clothing, w/ one drop of O2 sats briefly to 89%, then quick recovery. Pt was tired and felt SOB after ambulation, requested returning to bed rather than back to recliner. Mod I for bed mobility. Pt denies pain, reports she feels she will be safe at home with support from her children PRN. Provided educ re: ECS and taking rest breaks as needed, w/ pt voicing understanding.    Recommendations for follow up therapy are one component of a multi-disciplinary discharge planning process, led by the attending physician.  Recommendations may be updated based on patient status, additional functional criteria and insurance authorization.    Follow Up Recommendations  Home health OT     Assistance Recommended at Discharge Intermittent Supervision/Assistance  Patient can return home with the following  A little help with bathing/dressing/bathroom;Assistance with cooking/housework;Assist for transportation;A little help with walking and/or transfers   Equipment Recommendations  None recommended by OT    Recommendations for Other Services      Precautions / Restrictions Precautions Precautions: Fall Restrictions Weight Bearing Restrictions: No       Mobility Bed Mobility Overal bed mobility: Modified Independent Bed Mobility: Sit to Supine       Sit to supine: Modified independent (Device/Increase  time)        Transfers Overall transfer level: Needs assistance Equipment used: Rolling walker (2 wheels) Transfers: Sit to/from Stand Sit to Stand: Min guard                 Balance Overall balance assessment: Needs assistance Sitting-balance support: Feet supported Sitting balance-Leahy Scale: Good     Standing balance support: Bilateral upper extremity supported, During functional activity, Reliant on assistive device for balance Standing balance-Leahy Scale: Good                             ADL either performed or assessed with clinical judgement   ADL Overall ADL's : Needs assistance/impaired Eating/Feeding: Modified independent;Sitting                       Toilet Transfer: Supervision/safety;Minimal assistance;Rolling walker (2 wheels);Regular Glass blower/designer Details (indicate cue type and reason): SUPV for lowering onto low toilet; required Min A to come into standing. Anticipate that with comfort height toilet, could perform w/o assist Toileting- Clothing Manipulation and Hygiene: Supervision/safety       Functional mobility during ADLs: Rolling walker (2 wheels);Supervision/safety      Extremity/Trunk Assessment Upper Extremity Assessment Upper Extremity Assessment: Generalized weakness   Lower Extremity Assessment Lower Extremity Assessment: Generalized weakness   Cervical / Trunk Assessment Cervical / Trunk Assessment: Normal    Vision       Perception     Praxis      Cognition Arousal/Alertness: Awake/alert Behavior During Therapy: WFL for tasks assessed/performed Overall Cognitive Status: Within Functional Limits for tasks assessed  Exercises Other Exercises Other Exercises: Educ re: energy conservation strategies, taking rest breaks when feeling SOB or fatigued    Shoulder Instructions       General Comments O2 sats remain at 89% or higher on  RA w/ fxl mobility    Pertinent Vitals/ Pain       Pain Assessment Pain Assessment: No/denies pain  Home Living                                          Prior Functioning/Environment              Frequency  Min 2X/week        Progress Toward Goals  OT Goals(current goals can now be found in the care plan section)  Progress towards OT goals: Progressing toward goals  Acute Rehab OT Goals OT Goal Formulation: With patient Time For Goal Achievement: 06/21/22 Potential to Achieve Goals: Good  Plan Discharge plan remains appropriate;Frequency remains appropriate    Co-evaluation                 AM-PAC OT "6 Clicks" Daily Activity     Outcome Measure   Help from another person eating meals?: None Help from another person taking care of personal grooming?: A Little Help from another person toileting, which includes using toliet, bedpan, or urinal?: A Little Help from another person bathing (including washing, rinsing, drying)?: A Lot Help from another person to put on and taking off regular upper body clothing?: None Help from another person to put on and taking off regular lower body clothing?: A Little 6 Click Score: 19    End of Session Equipment Utilized During Treatment: Rolling walker (2 wheels)  OT Visit Diagnosis: Unsteadiness on feet (R26.81);Muscle weakness (generalized) (M62.81)   Activity Tolerance Patient tolerated treatment well   Patient Left in bed;with call bell/phone within reach;with bed alarm set   Nurse Communication Mobility status        Time: [id number] OT Time Calculation (min): 24 min  Charges: OT General Charges $OT Visit: 1 Visit OT Treatments $Self Care/Home Management : 23-37 mins Josiah Lobo, PhD, MS, OTR/L 06/10/22, 11:45 AM

## 2022-06-10 NOTE — Telephone Encounter (Signed)
Pt's daughter, patricia, called to let Dr. Darnell Level know that the pt is currently hospitalized for Covid & will discharged tomorrow, 3/5. [name] stated the pt will now be having [place] care with [facility]. Call back # [phone]

## 2022-06-11 ENCOUNTER — Ambulatory Visit: Payer: Medicare Other | Admitting: Dermatology

## 2022-06-11 DIAGNOSIS — A4189 Other specified sepsis: Secondary | ICD-10-CM | POA: Diagnosis not present

## 2022-06-11 DIAGNOSIS — U071 COVID-19: Secondary | ICD-10-CM | POA: Diagnosis not present

## 2022-06-11 NOTE — TOC Transition Note (Signed)
Transition of Care [facility]) - CM/SW Discharge Note   Patient Details  Name: [name] MRN: [id number] Date of Birth: 1930-01-21  Transition of Care [facility]) CM/SW Contact:  Laurena Slimmer, RN Phone Number: 06/11/2022, 11:38 AM   Clinical Narrative:    Spoke with patient's daughter, [name] regarding discharge home Patient daughter advised someone from [place] would be in contact with her to schedule an appointment for [facility].  Corene Cornea from [facility] notified Patient daughter will transport her home.   TOC signing off.          Patient Goals and CMS Choice      Discharge Placement                         Discharge Plan and Services Additional resources added to the After Visit Summary for                                       Social Determinants of Health (SDOH) Interventions SDOH Screenings   Food Insecurity: No Food Insecurity (06/03/2022)  Housing: Low Risk  (06/03/2022)  Transportation Needs: No Transportation Needs (06/03/2022)  Utilities: Not At Risk (06/03/2022)  Alcohol Screen: Low Risk  (08/27/2021)  Depression (PHQ2-9): Low Risk  (08/27/2021)  Financial Resource Strain: Low Risk  (08/27/2021)  Physical Activity: Inactive (08/27/2021)  Social Connections: Socially Isolated (08/27/2021)  Stress: No Stress Concern Present (08/27/2021)  Tobacco Use: Medium Risk (06/05/2022)     Readmission Risk Interventions     No data to display

## 2022-06-11 NOTE — Discharge Summary (Signed)
[name] [id number] DOB: 06-14-1929 DOA: 06/02/2022  PCP: Ria Bush, MD  Admit date: 06/02/2022 Discharge date: 06/11/2022  Time spent: 35 minutes  Recommendations for Outpatient Follow-up:  Pcp f/u     Discharge Diagnoses:  Principal Problem:   Sepsis due to COVID-19 [facility]) Active Problems:   Hypokalemia   Generalized weakness   Essential hypertension   Persistent atrial fibrillation (HCC)   Peripheral neuropathy   Acute respiratory failure with hypoxia (HCC)   Acute on chronic diastolic CHF (congestive heart failure) ([place])   Dyslipidemia   GERD without esophagitis   Hypotension   Moderate tricuspid regurgitation   Discharge Condition: improved  Diet recommendation: heart healthy  Filed Weights   06/09/22 0554 06/11/22 0015 06/11/22 0900  Weight: 69.5 kg 67 kg 66.6 kg    History of present illness:  From admission h and p [name] is a 89 y.o. Caucasian female with medical history significant for hypertension, dyslipidemia, rheumatoid arthritis, RLS,, who presented to emergency room with acute onset of generalized weakness with inability to walk.  She woke up yesterday morning with mild rhinorrhea and nasal congestion.  She did not have any nausea or vomiting or diarrhea.  No loss of taste or smell.  She developed low-grade fever of 99.7 by dinnertime and then started having worsening dyspnea with associated mild cough without chest pain or palpitations or wheezing.  She had diminished urine output throughout the day and has not been having any appetite for solid food or fluids.  She became significantly weak as mentioned above before coming to the ER.  She was noted to be hypoxic on room air.   Hospital Course:  Sepsis due to COVID-19 [facility]) Acute hypoxemic respite failure secondary to COVID infection. Initially treated with Paxlovid and IV steroids.  She developed volume overload with severe respiratory distress on the morning of 2/27, chest x-ray  showed evidence of volume overload.  BNP elevated.   Reviewed echocardiogram performed in 04/2022, ejection fraction 50 to 55%, moderate tricuspid regurgitation, diastolic function indeterminant. Have stopped paxlovid given hypoxia Treated with steroids Now weaned o2 to room air Symptoms resolved   Demand ischemia Mild trop elevation in setting of acute illness, trops 90s>80s, asymptomatic   Essential hypertension Controlled on current regimen   Generalized weakness Pt/ot revised recs home with home health Hh pt/ot/rn/aide ordered.  Hyperglycemia 2/2 steroids, resolved   A-fib, permanent - cont apixaban, metop  Procedures: none   Consultations: none  Discharge Exam: Vitals:   06/11/22 0342 06/11/22 0840  BP: (!) 122/93 (!) 169/88  Pulse: 95 69  Resp: 16 19  Temp: 97.6 F (36.4 C)   SpO2: 94% 98%    General exam: Appears calm and comfortable  Respiratory system: Respiratory effort normal. clear Cardiovascular system: S1 & S2 heard, RRR. No JVD, murmurs, rubs, gallops or clicks. No pedal edema. Gastrointestinal system: Abdomen is nondistended, soft and nontender. No organomegaly or masses felt. Normal bowel sounds heard. Central nervous system: Alert and oriented x3. No focal neurological deficits. Extremities: Symmetric 5 x 5 power. Skin: No rashes, lesions or ulcers Psychiatry: Mood & affect appropriate.   Discharge Instructions   Discharge Instructions     Diet - low sodium heart healthy   Complete by: As directed    Increase activity slowly   Complete by: As directed       Allergies as of 06/11/2022       Reactions   Penicillins Rash, Other (See Comments)   Childhood  Allergy Has patient had a PCN reaction causing immediate rash, facial/tongue/throat swelling, SOB or lightheadedness with hypotension: Yes Has patient had a PCN reaction causing severe rash involving mucus membranes or skin necrosis: Unknown Has patient had a PCN reaction that required  hospitalization: No Has patient had a PCN reaction occurring within the last 10 years: No If all of the above answers are "NO", then may proceed with Cephalosporin use.        Medication List     STOP taking these medications    doxycycline 100 MG capsule Commonly known as: MONODOX   mupirocin ointment 2 % Commonly known as: BACTROBAN       TAKE these medications    acetaminophen 500 MG tablet Commonly known as: TYLENOL Take 500 mg by mouth every 6 (six) hours as needed for mild pain.   acyclovir 400 MG tablet Commonly known as: ZOVIRAX Take 400 mg by mouth 2 (two) times daily.   Alpha-Lipoic Acid 600 MG Tabs Take 600 mg by mouth daily.   atorvastatin 20 MG tablet Commonly known as: LIPITOR Take 1 tablet (20 mg total) by mouth daily.   DULoxetine 60 MG capsule Commonly known as: CYMBALTA TAKE 1 CAPSULE BY MOUTH ONCE DAILY   Eliquis 5 MG Tabs tablet Generic drug: apixaban TAKE 1 TABLET BY MOUTH TWICE DAILY   furosemide 20 MG tablet Commonly known as: LASIX TAKE ONE TABLET BY MOUTH ONCE DAILY   gabapentin 400 MG capsule Commonly known as: NEURONTIN TAKE 1 CAPSULE BY MOUTH AT BEDTIME AND SECOND DOSE DURING THE DAY AS NEEDED   hydrocortisone 2.5 % rectal cream Commonly known as: Anusol-HC Place 1 application rectally daily as needed for hemorrhoids or anal itching. For hemmorhoid   lisinopril 20 MG tablet Commonly known as: ZESTRIL TAKE ONE TABLET BY MOUTH ONCE DAILY   memantine 10 MG tablet Commonly known as: NAMENDA Take 1 tablet (10 mg total) by mouth 2 (two) times daily.   metoprolol succinate 100 MG 24 hr tablet Commonly known as: TOPROL-XL TAKE 1 TABLET BY MOUTH TWICE DAILY   mometasone 0.1 % lotion Commonly known as: ELOCON 2-4 drops daily.   pantoprazole 40 MG tablet Commonly known as: PROTONIX Take 1 tablet (40 mg total) by mouth daily.   polyethylene glycol 17 g packet Commonly known as: MIRALAX / GLYCOLAX Take 17 g by mouth daily  as needed for mild constipation.   prednisoLONE acetate 1 % ophthalmic suspension Commonly known as: PRED FORTE Place 1 drop into the left eye daily.   rOPINIRole 1 MG tablet Commonly known as: REQUIP Take 2.5 tablets (2.5 mg total) by mouth at bedtime.   STOOL SOFTENER PO Take 1 capsule by mouth daily.   tiZANidine 2 MG tablet Commonly known as: ZANAFLEX Take 2 mg by mouth daily.   traMADol 50 MG tablet Commonly known as: ULTRAM Take 1-2 tablets (50-100 mg total) by mouth at bedtime as needed.   Vitamin D (Ergocalciferol) 1.25 MG (50000 UNIT) Caps capsule Commonly known as: DRISDOL Take 1 capsule (50,000 Units total) by mouth every 7 (seven) days.       Allergies  Allergen Reactions   Penicillins Rash and Other (See Comments)    Childhood Allergy Has patient had a PCN reaction causing immediate rash, facial/tongue/throat swelling, SOB or lightheadedness with hypotension: Yes Has patient had a PCN reaction causing severe rash involving mucus membranes or skin necrosis: Unknown Has patient had a PCN reaction that required hospitalization: No Has patient had a PCN  reaction occurring within the last 10 years: No If all of the above answers are "NO", then may proceed with Cephalosporin use.     Follow-up Information     [facility] Follow up.   Why: They will follow up with you for your home health needs.        Ria Bush, MD Follow up.   Specialty: Family Medicine Contact information: [place] [address] [phone]                  The results of significant diagnostics from this hospitalization (including imaging, microbiology, ancillary and laboratory) are listed below for reference.    Significant Diagnostic Studies: CT Angio Chest Pulmonary Embolism (PE) W or WO Contrast  Result Date: 06/05/2022 CLINICAL DATA:  Hypoxia.  Sepsis.  Coronavirus infection. EXAM: CT ANGIOGRAPHY CHEST WITH CONTRAST TECHNIQUE:  Multidetector CT imaging of the chest was performed using the standard protocol during bolus administration of intravenous contrast. Multiplanar CT image reconstructions and MIPs were obtained to evaluate the vascular anatomy. RADIATION DOSE REDUCTION: This exam was performed according to the departmental dose-optimization program which includes automated exposure control, adjustment of the mA and/or kV according to patient size and/or use of iterative reconstruction technique. CONTRAST:  60m OMNIPAQUE IOHEXOL 350 MG/ML SOLN COMPARISON:  Chest CT yesterday.  CT 07/06/2019. FINDINGS: Cardiovascular: Mild cardiomegaly. No pericardial fluid. Coronary artery calcification and aortic atherosclerotic calcification are present. Pulmonary arterial opacification is good. There are no pulmonary emboli. Mediastinum/Nodes: No mass or lymphadenopathy. Moderate to large hiatal hernia. Lungs/Pleura: Patchy but widespread bilateral pulmonary infiltrates consistent with bronchopneumonia. No dense consolidation or lobar collapse. Small amount of pleural fluid layering dependently, right more than left. Upper Abdomen: No acute upper abdominal finding. Air in the biliary tree presumed secondary to previous sphincterotomy. Musculoskeletal: Scoliosis and chronic degenerative change of the spine. Few presumed old superior endplate fractures in the thoracic region. Review of the MIP images confirms the above findings. IMPRESSION: 1. No pulmonary emboli. 2. Patchy but widespread bilateral pulmonary infiltrates consistent with bronchopneumonia. No dense consolidation or lobar collapse. Small amount of pleural fluid layering dependently, right more than left. 3. Cardiomegaly. Coronary artery calcification. Aortic atherosclerosis. 4. Moderate to large hiatal hernia. Aortic Atherosclerosis (ICD10-I70.0). Electronically Signed   By: MNelson ChimesM.D.   On: 06/05/2022 14:51   DG Chest Port 1 View  Result Date: 06/04/2022 CLINICAL DATA:   Hypoxia, COVID positive EXAM: PORTABLE CHEST 1 VIEW COMPARISON:  06/02/2022 FINDINGS: The lungs are symmetrically well expanded. Diffuse interstitial and alveolar pulmonary infiltrate, asymmetrically more severe throughout the right lung, is present, infection versus asymmetric pulmonary edema. No pneumothorax or pleural effusion. Cardiac size at the upper limits of normal. Moderate hiatal hernia again noted. No acute bone abnormality. IMPRESSION: 1. Interval development of diffuse interstitial and alveolar pulmonary infiltrate, asymmetrically more severe throughout the right lung, infection versus asymmetric pulmonary edema. Electronically Signed   By: AFidela SalisburyM.D.   On: 06/04/2022 01:36   DG Chest Port 1 View  Result Date: 06/03/2022 CLINICAL DATA:  Sepsis EXAM: PORTABLE CHEST 1 VIEW COMPARISON:  05/30/2021 FINDINGS: The lungs are symmetrically well expanded. Right cardiophrenic angle opacity represents a moderate hiatal hernia better seen on CT examination of 07/06/2019. Lungs are otherwise clear. No pneumothorax or pleural effusion. Cardiac size is at the upper limits of normal, likely accentuated by semi-erect positioning. Pulmonary vascularity is normal. No acute bone abnormality. IMPRESSION: 1. No active disease. 2. Moderate hiatal  hernia. Electronically Signed   By: Fidela Salisbury M.D.   On: 06/03/2022 00:04    Microbiology: Recent Results (from the past 240 hour(s))  Resp panel by RT-PCR (RSV, Flu A&B, Covid) Anterior Nasal Swab     Status: Abnormal   Collection Time: 06/02/22 11:53 PM   Specimen: Anterior Nasal Swab  Result Value Ref Range Status   SARS Coronavirus 2 by RT PCR POSITIVE (A) NEGATIVE Final    Comment: (NOTE) SARS-CoV-2 target nucleic acids are DETECTED.  The SARS-CoV-2 RNA is generally detectable in upper respiratory specimens during the acute phase of infection. Positive results are indicative of the presence of the identified virus, but do not rule out bacterial  infection or co-infection with other pathogens not detected by the test. Clinical correlation with patient history and other diagnostic information is necessary to determine patient infection status. The expected result is Negative.  Fact Sheet for Patients: EntrepreneurPulse.com.au  Fact Sheet for Healthcare Providers: IncredibleEmployment.be  This test is not yet approved or cleared by the [place] FDA and  has been authorized for detection and/or diagnosis of SARS-CoV-2 by FDA under an Emergency Use Authorization (EUA).  This EUA will remain in effect (meaning this test can be used) for the duration of  the COVID-19 declaration under Section 564(b)(1) of the A ct, 21 U.S.C. section 360bbb-3(b)(1), unless the authorization is terminated or revoked sooner.     Influenza A by PCR NEGATIVE NEGATIVE Final   Influenza B by PCR NEGATIVE NEGATIVE Final    Comment: (NOTE) The Xpert Xpress SARS-CoV-2/FLU/RSV plus assay is intended as an aid in the diagnosis of influenza from Nasopharyngeal swab specimens and should not be used as a sole basis for treatment. Nasal washings and aspirates are unacceptable for Xpert Xpress SARS-CoV-2/FLU/RSV testing.  Fact Sheet for Patients: EntrepreneurPulse.com.au  Fact Sheet for Healthcare Providers: IncredibleEmployment.be  This test is not yet approved or cleared by the [place] FDA and has been authorized for detection and/or diagnosis of SARS-CoV-2 by FDA under an Emergency Use Authorization (EUA). This EUA will remain in effect (meaning this test can be used) for the duration of the COVID-19 declaration under Section 564(b)(1) of the Act, 21 U.S.C. section 360bbb-3(b)(1), unless the authorization is terminated or revoked.     Resp Syncytial Virus by PCR NEGATIVE NEGATIVE Final    Comment: (NOTE) Fact Sheet for  Patients: EntrepreneurPulse.com.au  Fact Sheet for Healthcare Providers: IncredibleEmployment.be  This test is not yet approved or cleared by the [place] FDA and has been authorized for detection and/or diagnosis of SARS-CoV-2 by FDA under an Emergency Use Authorization (EUA). This EUA will remain in effect (meaning this test can be used) for the duration of the COVID-19 declaration under Section 564(b)(1) of the Act, 21 U.S.C. section 360bbb-3(b)(1), unless the authorization is terminated or revoked.  Performed at [facility], [address] [place], [place] [address]   Blood Culture (routine x 2)     Status: None   Collection Time: 06/02/22 11:53 PM   Specimen: BLOOD  Result Value Ref Range Status   Specimen Description BLOOD BLOOD RIGHT ARM rIGHT ANTECUBITAL  Final   Special Requests   Final    BOTTLES DRAWN AEROBIC AND ANAEROBIC Blood Culture results may not be optimal due to an excessive volume of blood received in culture bottles   Culture   Final    NO GROWTH 5 DAYS Performed at [facility], [address] [place], [place] [address]  Report Status 06/08/2022 FINAL  Final  Blood Culture (routine x 2)     Status: None   Collection Time: 06/02/22 11:53 PM   Specimen: BLOOD  Result Value Ref Range Status   Specimen Description BLOOD BLOOD LEFT ARM LEFT FOREARM  Final   Special Requests   Final    BOTTLES DRAWN AEROBIC AND ANAEROBIC Blood Culture adequate volume   Culture   Final    NO GROWTH 5 DAYS Performed at [facility], [address], [place], [place] [address]    Report Status 06/08/2022 FINAL  Final  Urine Culture (for pregnant, neutropenic or urologic patients or patients with an indwelling urinary catheter)     Status: None   Collection Time: 06/02/22 11:53 PM   Specimen: Urine, Clean Catch  Result Value Ref Range Status   Specimen Description   Final    URINE, CLEAN  CATCH Performed at [facility], [address], [place], [place] [address]    Special Requests   Final    NONE Performed at [facility], [address], [place], [place] [address]    Culture   Final    NO GROWTH Performed at [facility], [place] [address], [place], [place] [address]    Report Status 06/04/2022 FINAL  Final     Labs: Basic Metabolic Panel: Recent Labs  Lab 06/05/22 0310 06/06/22 0659 06/07/22 0551 06/08/22 0615  NA 143 143 143 138  K 3.3* 3.3* 4.2 3.7  CL 100 98 98 97*  CO2 29 33* 37* 32  GLUCOSE 155* 146* 142* 107*  BUN 20 29* 36* 43*  CREATININE 0.87 0.88 1.09* 0.97  CALCIUM 9.1 9.2 8.9 8.9  MG 2.0  --   --   --    Liver Function Tests: Recent Labs  Lab 06/05/22 0310 06/06/22 0659  AST 38 27  ALT 29 26  ALKPHOS 65 61  BILITOT 0.7 0.8  PROT 6.8 6.4*  ALBUMIN 3.4* 3.2*   No results for input(s): "LIPASE", "AMYLASE" in the last 168 hours. No results for input(s): "AMMONIA" in the last 168 hours. CBC: Recent Labs  Lab 06/05/22 0310 06/06/22 0659  WBC 16.5* 12.3*  NEUTROABS 15.0* 11.0*  HGB 12.7 11.7*  HCT 37.8 35.5*  MCV 95.5 95.9  PLT 236 216   Cardiac Enzymes: No results for input(s): "CKTOTAL", "CKMB", "CKMBINDEX", "TROPONINI" in the last 168 hours. BNP: BNP (last 3 results) Recent Labs    06/04/22 0637  BNP 710.6*    ProBNP (last 3 results) No results for input(s): "PROBNP" in the last 8760 hours.  CBG: Recent Labs  Lab 06/09/22 0825 06/09/22 0844 06/09/22 1151 06/09/22 1719 06/10/22 0513  GLUCAP 98 89 108* 181* 119*       Signed:  Desma Maxim MD.  [facility] 06/11/2022, 10:45 AM

## 2022-06-11 NOTE — Progress Notes (Signed)
       CROSS COVER NOTE  NAME: ELENOA SPRAGGINS MRN: [id number] DOB : Sep 28, 1929 ATTENDING PHYSICIAN: Gwynne Edinger, MD    Date of Service   06/11/2022   HPI/Events of Note   Report *** On Review of chart *** Bedside eval*** HPI***  Interventions   Assessment/Plan:  Atrial Fibrillation with Rapid Ventricular Response- HR 142 5 mg IV Metoprolol X X    *** professional thanks      To reach the provider On-Call:   7AM- 7PM see care teams to locate the attending and reach out to them via [email or web address] Password: [email or web address] 7PM-7AM contact night-coverage If you still have difficulty reaching the appropriate provider, please page the [facility] (Director on Call) for [facility] on amion for assistance  This document was prepared using Systems analyst and may include unintentional dictation errors.  Neomia Glass DNP, MBA, FNP-BC, PMHNP-BC Nurse Practitioner [facility] Pager [phone]

## 2022-06-12 ENCOUNTER — Ambulatory Visit: Payer: Self-pay

## 2022-06-12 ENCOUNTER — Telehealth: Payer: Self-pay | Admitting: *Deleted

## 2022-06-12 ENCOUNTER — Telehealth: Payer: Self-pay | Admitting: Family Medicine

## 2022-06-12 DIAGNOSIS — U071 COVID-19: Secondary | ICD-10-CM | POA: Diagnosis not present

## 2022-06-12 DIAGNOSIS — E876 Hypokalemia: Secondary | ICD-10-CM | POA: Diagnosis not present

## 2022-06-12 DIAGNOSIS — A419 Sepsis, unspecified organism: Secondary | ICD-10-CM | POA: Diagnosis not present

## 2022-06-12 DIAGNOSIS — E785 Hyperlipidemia, unspecified: Secondary | ICD-10-CM | POA: Diagnosis not present

## 2022-06-12 DIAGNOSIS — I110 Hypertensive heart disease with heart failure: Secondary | ICD-10-CM | POA: Diagnosis not present

## 2022-06-12 DIAGNOSIS — G2581 Restless legs syndrome: Secondary | ICD-10-CM | POA: Diagnosis not present

## 2022-06-12 DIAGNOSIS — K449 Diaphragmatic hernia without obstruction or gangrene: Secondary | ICD-10-CM | POA: Diagnosis not present

## 2022-06-12 DIAGNOSIS — M1990 Unspecified osteoarthritis, unspecified site: Secondary | ICD-10-CM | POA: Diagnosis not present

## 2022-06-12 DIAGNOSIS — J9601 Acute respiratory failure with hypoxia: Secondary | ICD-10-CM | POA: Diagnosis not present

## 2022-06-12 DIAGNOSIS — M5137 Other intervertebral disc degeneration, lumbosacral region: Secondary | ICD-10-CM | POA: Diagnosis not present

## 2022-06-12 DIAGNOSIS — I2510 Atherosclerotic heart disease of native coronary artery without angina pectoris: Secondary | ICD-10-CM | POA: Diagnosis not present

## 2022-06-12 DIAGNOSIS — I959 Hypotension, unspecified: Secondary | ICD-10-CM | POA: Diagnosis not present

## 2022-06-12 DIAGNOSIS — M810 Age-related osteoporosis without current pathological fracture: Secondary | ICD-10-CM | POA: Diagnosis not present

## 2022-06-12 DIAGNOSIS — K219 Gastro-esophageal reflux disease without esophagitis: Secondary | ICD-10-CM | POA: Diagnosis not present

## 2022-06-12 DIAGNOSIS — G629 Polyneuropathy, unspecified: Secondary | ICD-10-CM | POA: Diagnosis not present

## 2022-06-12 DIAGNOSIS — I4821 Permanent atrial fibrillation: Secondary | ICD-10-CM | POA: Diagnosis not present

## 2022-06-12 DIAGNOSIS — Z87891 Personal history of nicotine dependence: Secondary | ICD-10-CM | POA: Diagnosis not present

## 2022-06-12 DIAGNOSIS — I5033 Acute on chronic diastolic (congestive) heart failure: Secondary | ICD-10-CM | POA: Diagnosis not present

## 2022-06-12 DIAGNOSIS — I739 Peripheral vascular disease, unspecified: Secondary | ICD-10-CM | POA: Diagnosis not present

## 2022-06-12 DIAGNOSIS — R739 Hyperglycemia, unspecified: Secondary | ICD-10-CM | POA: Diagnosis not present

## 2022-06-12 DIAGNOSIS — Z7901 Long term (current) use of anticoagulants: Secondary | ICD-10-CM | POA: Diagnosis not present

## 2022-06-12 DIAGNOSIS — M069 Rheumatoid arthritis, unspecified: Secondary | ICD-10-CM | POA: Diagnosis not present

## 2022-06-12 DIAGNOSIS — I071 Rheumatic tricuspid insufficiency: Secondary | ICD-10-CM | POA: Diagnosis not present

## 2022-06-12 DIAGNOSIS — Z85828 Personal history of other malignant neoplasm of skin: Secondary | ICD-10-CM | POA: Diagnosis not present

## 2022-06-12 NOTE — Chronic Care Management (AMB) (Signed)
   06/12/2022  [name] [birth year] [id number]   Reason for Encounter: Change in CCM enrollment status   Horris Latino RN Care Manager/Chronic Care Management [phone]

## 2022-06-12 NOTE — Telephone Encounter (Signed)
Agree with this. Thanks.  

## 2022-06-12 NOTE — Transitions of Care (Post Inpatient/ED Visit) (Signed)
   06/12/2022  Name: [name] MRN: [id number] DOB: 04/21/1929  Today's TOC FU Call Status: Today's TOC FU Call Status:: Successful TOC FU Call Competed TOC FU Call Complete Date: 06/12/22  Transition Care Management Follow-up Telephone Call Date of Discharge: 06/11/22 Discharge Facility: [facility]) Type of Discharge: Inpatient Admission Primary Inpatient Discharge Diagnosis:: Sepsis due to COVID How have you been since you were released from the hospital?: Same (Patient had a restless night but is sitting up in the chair now. [facility] nurse has arrived) Any questions or concerns?: No  Items Reviewed: Did you receive and understand the discharge instructions provided?: Yes Medications obtained and verified?: Yes (Medications Reviewed) Any new allergies since your discharge?: No Dietary orders reviewed?: No Do you have support at home?: Yes People in Home: child(ren), adult Name of Support/Comfort Primary Source: [facility] and Equipment/Supplies: [place] Ordered?: Yes Name of [place]:: [place] Has Agency set up a time to come to your home?: Yes Sun Visit Date: 06/12/22 Any new equipment or medical supplies ordered?: No  Functional Questionnaire: Do you need assistance with bathing/showering or dressing?: Yes Do you need assistance with meal preparation?: Yes Do you need assistance with eating?: No Do you have difficulty maintaining continence: Yes Do you need assistance with getting out of bed/getting out of a chair/moving?: Yes Do you have difficulty managing or taking your medications?: Yes  Folllow up appointments reviewed: PCP Follow-up appointment confirmed?: Yes Date of PCP follow-up appointment?: 06/19/22 Follow-up Provider: Dr Danise Mina [facility] Follow-up appointment confirmed?: NA Do you need transportation to your follow-up appointment?: No Do you understand care  options if your condition(s) worsen?: Yes-patient verbalized understanding  SDOH Interventions Today    Flowsheet Row Most Recent Value  SDOH Interventions   Food Insecurity Interventions Intervention Not Indicated  Housing Interventions Intervention Not Indicated  Transportation Interventions Intervention Not Indicated      Interventions Today    Flowsheet Row Most Recent Value  General Interventions   General Interventions Discussed/Reviewed Doctor Visits  Doctor Visits Discussed/Reviewed Doctor Visits Reviewed, Doctor Visits Discussed      TOC Interventions Today    Flowsheet Row Most Recent Value  TOC Interventions   TOC Interventions Discussed/Reviewed TOC Interventions Discussed  [[facility] has arrived and is there with the patient now]        [facility] [phone]

## 2022-06-12 NOTE — Telephone Encounter (Signed)
Spoke with [name] informing her Dr. Darnell Level is giving verbal orders for services requested for pt.

## 2022-06-12 NOTE — Telephone Encounter (Signed)
Home Health verbal orders Caller Name: cheryl  Agency Name: [facility] [place] number: [id number], secured   Requesting OT/PT/Skilled nursing/Social Work/Speech: skilled nursing   Reason:  Frequency:one week nine   Please forward to [facility] pool or providers CMA

## 2022-06-13 ENCOUNTER — Telehealth (INDEPENDENT_AMBULATORY_CARE_PROVIDER_SITE_OTHER): Payer: Medicare Other | Admitting: Family Medicine

## 2022-06-13 ENCOUNTER — Telehealth: Payer: Self-pay | Admitting: Family Medicine

## 2022-06-13 DIAGNOSIS — I4819 Other persistent atrial fibrillation: Secondary | ICD-10-CM

## 2022-06-13 DIAGNOSIS — R531 Weakness: Secondary | ICD-10-CM

## 2022-06-13 DIAGNOSIS — R8290 Unspecified abnormal findings in urine: Secondary | ICD-10-CM | POA: Diagnosis not present

## 2022-06-13 DIAGNOSIS — Z515 Encounter for palliative care: Secondary | ICD-10-CM

## 2022-06-13 DIAGNOSIS — I5033 Acute on chronic diastolic (congestive) heart failure: Secondary | ICD-10-CM

## 2022-06-13 DIAGNOSIS — E440 Moderate protein-calorie malnutrition: Secondary | ICD-10-CM

## 2022-06-13 DIAGNOSIS — Z66 Do not resuscitate: Secondary | ICD-10-CM

## 2022-06-13 NOTE — Telephone Encounter (Signed)
Noted. Thank you. Will see her next week.  I did place home palliative care order today for home care as well.

## 2022-06-13 NOTE — Telephone Encounter (Signed)
Rtn pt's daughter, Patricia's (on dpr), call. States pt spent 8 days in hosp for Covid. D/c from rehab 2 days ago. Still waiting on PT and OT to be scheduled. HH is authorized for for once a wk for with some PRN visits. However, she wants to let Dr. Darnell Level know pt is so weak. Having trouble staying alert to take meds, loss of appetite and following through with simple instructions. Says she was told by hosp and rehab this may happen due to pt's age and longer recovery time. Told to expect fatigue and brain fog to "last a while". [name] is concerned due to not seeing pt in this state but understands due to pt's age. Also, states she will notify [facility] nurse of above info. Expresses her thanks for listening.

## 2022-06-13 NOTE — Telephone Encounter (Signed)
Pt's daughter, [name], called requesting a call back from [name] to discuss some changes within the pt's health. [name] stated the pt has "took a turn for the worse". [name] wanted to give Dr. Darnell Level a update on the pt & have some questions answered. Call back # [phone]

## 2022-06-13 NOTE — Telephone Encounter (Signed)
Received request for palliative care consult at home. Order placed.

## 2022-06-14 ENCOUNTER — Telehealth: Payer: Self-pay | Admitting: Family Medicine

## 2022-06-14 ENCOUNTER — Other Ambulatory Visit: Payer: Self-pay

## 2022-06-14 DIAGNOSIS — J9601 Acute respiratory failure with hypoxia: Secondary | ICD-10-CM | POA: Diagnosis not present

## 2022-06-14 DIAGNOSIS — U071 COVID-19: Secondary | ICD-10-CM | POA: Diagnosis not present

## 2022-06-14 DIAGNOSIS — I739 Peripheral vascular disease, unspecified: Secondary | ICD-10-CM | POA: Diagnosis not present

## 2022-06-14 DIAGNOSIS — I959 Hypotension, unspecified: Secondary | ICD-10-CM | POA: Diagnosis not present

## 2022-06-14 DIAGNOSIS — A419 Sepsis, unspecified organism: Secondary | ICD-10-CM | POA: Diagnosis not present

## 2022-06-14 DIAGNOSIS — M810 Age-related osteoporosis without current pathological fracture: Secondary | ICD-10-CM | POA: Diagnosis not present

## 2022-06-14 DIAGNOSIS — R739 Hyperglycemia, unspecified: Secondary | ICD-10-CM | POA: Diagnosis not present

## 2022-06-14 DIAGNOSIS — K219 Gastro-esophageal reflux disease without esophagitis: Secondary | ICD-10-CM | POA: Diagnosis not present

## 2022-06-14 DIAGNOSIS — I4821 Permanent atrial fibrillation: Secondary | ICD-10-CM | POA: Diagnosis not present

## 2022-06-14 DIAGNOSIS — M069 Rheumatoid arthritis, unspecified: Secondary | ICD-10-CM | POA: Diagnosis not present

## 2022-06-14 DIAGNOSIS — I2510 Atherosclerotic heart disease of native coronary artery without angina pectoris: Secondary | ICD-10-CM | POA: Diagnosis not present

## 2022-06-14 DIAGNOSIS — G2581 Restless legs syndrome: Secondary | ICD-10-CM | POA: Diagnosis not present

## 2022-06-14 DIAGNOSIS — I5033 Acute on chronic diastolic (congestive) heart failure: Secondary | ICD-10-CM | POA: Diagnosis not present

## 2022-06-14 DIAGNOSIS — G629 Polyneuropathy, unspecified: Secondary | ICD-10-CM | POA: Diagnosis not present

## 2022-06-14 DIAGNOSIS — Z87891 Personal history of nicotine dependence: Secondary | ICD-10-CM | POA: Diagnosis not present

## 2022-06-14 DIAGNOSIS — E876 Hypokalemia: Secondary | ICD-10-CM | POA: Diagnosis not present

## 2022-06-14 DIAGNOSIS — Z85828 Personal history of other malignant neoplasm of skin: Secondary | ICD-10-CM | POA: Diagnosis not present

## 2022-06-14 DIAGNOSIS — M1990 Unspecified osteoarthritis, unspecified site: Secondary | ICD-10-CM | POA: Diagnosis not present

## 2022-06-14 DIAGNOSIS — I071 Rheumatic tricuspid insufficiency: Secondary | ICD-10-CM | POA: Diagnosis not present

## 2022-06-14 DIAGNOSIS — I110 Hypertensive heart disease with heart failure: Secondary | ICD-10-CM | POA: Diagnosis not present

## 2022-06-14 DIAGNOSIS — R8290 Unspecified abnormal findings in urine: Secondary | ICD-10-CM

## 2022-06-14 DIAGNOSIS — K449 Diaphragmatic hernia without obstruction or gangrene: Secondary | ICD-10-CM | POA: Diagnosis not present

## 2022-06-14 DIAGNOSIS — M5137 Other intervertebral disc degeneration, lumbosacral region: Secondary | ICD-10-CM | POA: Diagnosis not present

## 2022-06-14 DIAGNOSIS — E785 Hyperlipidemia, unspecified: Secondary | ICD-10-CM | POA: Diagnosis not present

## 2022-06-14 DIAGNOSIS — Z7901 Long term (current) use of anticoagulants: Secondary | ICD-10-CM | POA: Diagnosis not present

## 2022-06-14 LAB — POC URINALSYSI DIPSTICK (AUTOMATED)
Bilirubin, UA: NEGATIVE
Blood, UA: NEGATIVE
Glucose, UA: NEGATIVE
Nitrite, UA: NEGATIVE
Protein, UA: NEGATIVE
Spec Grav, UA: 1.01 (ref 1.010–1.025)
Urobilinogen, UA: 1 E.U./dL
pH, UA: 7 (ref 5.0–8.0)

## 2022-06-14 NOTE — Addendum Note (Signed)
Addended by: Ria Bush on: 06/14/2022 04:38 PM   Modules accepted: Orders

## 2022-06-14 NOTE — Telephone Encounter (Signed)
Agree with this. Thanks.  

## 2022-06-14 NOTE — Telephone Encounter (Signed)
Spoke with [name] informing her Dr. G is giving verbal orders for services requested.  

## 2022-06-14 NOTE — Addendum Note (Signed)
Addended by: Brenton Grills on: [id number] PM   Modules accepted: Orders

## 2022-06-14 NOTE — Telephone Encounter (Signed)
Pt's daughter, [name], requested a call back from [place] regarding some f/u? Call back # [phone]

## 2022-06-14 NOTE — Addendum Note (Signed)
Addended by: Pilar Grammes on: 06/14/2022 04:46 PM   Modules accepted: Orders

## 2022-06-14 NOTE — Telephone Encounter (Signed)
Called and spoke to patient's daughter and was advised that the home health RN was there yesterday. [name] stated that her mom has an odor to her urine. [name] stated the aid that is with her mom told her yesterday that her urine is dark and has an odor. [name] stated that the RN told her that she can see about getting an order for a UA from Dr. Danise Mina next week when she comes back. [name] stated that she does not know when the RN plans to come next week. [name] stated that her mom was in the hospital really sick and has only been home for two days. Patient's daughter stated if her mom has a UTI she does not want this to go thru the weekend without treatment and that is why she has been staying on  top of this. After speaking with Dr. Danise Mina patient's daughter was advised she can drop a urine off today to be checked . Patient's daughter was advised that she will need to sterilize a jar and she verbalized understanding. Patient's daughter stated that she will get the urine sample here as soon as she can. [name] was advised of the office hours.

## 2022-06-14 NOTE — Telephone Encounter (Addendum)
UA not conclusive for infection - UCx sent, will await results.  Notified pt via mychart.

## 2022-06-14 NOTE — Telephone Encounter (Signed)
Home Health verbal orders Caller Name: [place] Name: Joline Maxcy Quinby number: [phone], secured   Requesting OT/PT/Skilled nursing/Social Work/Speech: OT   Reason:  Frequency: one week one , two week three   Please forward to [facility] pool or providers CMA

## 2022-06-14 NOTE — Addendum Note (Signed)
Addended by: Pilar Grammes on: 06/14/2022 04:49 PM   Modules accepted: Orders

## 2022-06-14 NOTE — Telephone Encounter (Signed)
Pt's daughter, [name], dropped off urine sample.   Urine dipped and results documented.

## 2022-06-15 LAB — URINE CULTURE
MICRO NUMBER:: 14668406
SPECIMEN QUALITY:: ADEQUATE

## 2022-06-17 ENCOUNTER — Telehealth: Payer: Self-pay

## 2022-06-17 DIAGNOSIS — I739 Peripheral vascular disease, unspecified: Secondary | ICD-10-CM | POA: Diagnosis not present

## 2022-06-17 DIAGNOSIS — I5033 Acute on chronic diastolic (congestive) heart failure: Secondary | ICD-10-CM | POA: Diagnosis not present

## 2022-06-17 DIAGNOSIS — K449 Diaphragmatic hernia without obstruction or gangrene: Secondary | ICD-10-CM | POA: Diagnosis not present

## 2022-06-17 DIAGNOSIS — I110 Hypertensive heart disease with heart failure: Secondary | ICD-10-CM | POA: Diagnosis not present

## 2022-06-17 DIAGNOSIS — R739 Hyperglycemia, unspecified: Secondary | ICD-10-CM | POA: Diagnosis not present

## 2022-06-17 DIAGNOSIS — I959 Hypotension, unspecified: Secondary | ICD-10-CM | POA: Diagnosis not present

## 2022-06-17 DIAGNOSIS — M810 Age-related osteoporosis without current pathological fracture: Secondary | ICD-10-CM | POA: Diagnosis not present

## 2022-06-17 DIAGNOSIS — E876 Hypokalemia: Secondary | ICD-10-CM | POA: Diagnosis not present

## 2022-06-17 DIAGNOSIS — Z85828 Personal history of other malignant neoplasm of skin: Secondary | ICD-10-CM | POA: Diagnosis not present

## 2022-06-17 DIAGNOSIS — M1990 Unspecified osteoarthritis, unspecified site: Secondary | ICD-10-CM | POA: Diagnosis not present

## 2022-06-17 DIAGNOSIS — M5137 Other intervertebral disc degeneration, lumbosacral region: Secondary | ICD-10-CM | POA: Diagnosis not present

## 2022-06-17 DIAGNOSIS — M069 Rheumatoid arthritis, unspecified: Secondary | ICD-10-CM | POA: Diagnosis not present

## 2022-06-17 DIAGNOSIS — I2510 Atherosclerotic heart disease of native coronary artery without angina pectoris: Secondary | ICD-10-CM | POA: Diagnosis not present

## 2022-06-17 DIAGNOSIS — E785 Hyperlipidemia, unspecified: Secondary | ICD-10-CM | POA: Diagnosis not present

## 2022-06-17 DIAGNOSIS — Z7901 Long term (current) use of anticoagulants: Secondary | ICD-10-CM | POA: Diagnosis not present

## 2022-06-17 DIAGNOSIS — A419 Sepsis, unspecified organism: Secondary | ICD-10-CM | POA: Diagnosis not present

## 2022-06-17 DIAGNOSIS — K219 Gastro-esophageal reflux disease without esophagitis: Secondary | ICD-10-CM | POA: Diagnosis not present

## 2022-06-17 DIAGNOSIS — G2581 Restless legs syndrome: Secondary | ICD-10-CM | POA: Diagnosis not present

## 2022-06-17 DIAGNOSIS — J9601 Acute respiratory failure with hypoxia: Secondary | ICD-10-CM | POA: Diagnosis not present

## 2022-06-17 DIAGNOSIS — G629 Polyneuropathy, unspecified: Secondary | ICD-10-CM | POA: Diagnosis not present

## 2022-06-17 DIAGNOSIS — Z87891 Personal history of nicotine dependence: Secondary | ICD-10-CM | POA: Diagnosis not present

## 2022-06-17 DIAGNOSIS — I071 Rheumatic tricuspid insufficiency: Secondary | ICD-10-CM | POA: Diagnosis not present

## 2022-06-17 DIAGNOSIS — I4821 Permanent atrial fibrillation: Secondary | ICD-10-CM | POA: Diagnosis not present

## 2022-06-17 DIAGNOSIS — U071 COVID-19: Secondary | ICD-10-CM | POA: Diagnosis not present

## 2022-06-17 NOTE — Telephone Encounter (Signed)
1030 am. New Palliative Care referral received.  Phone call made to daughter and visit scheduled for Thursday @ 1230.

## 2022-06-18 ENCOUNTER — Telehealth: Payer: Self-pay | Admitting: Family Medicine

## 2022-06-18 DIAGNOSIS — M069 Rheumatoid arthritis, unspecified: Secondary | ICD-10-CM | POA: Diagnosis not present

## 2022-06-18 DIAGNOSIS — U071 COVID-19: Secondary | ICD-10-CM | POA: Diagnosis not present

## 2022-06-18 DIAGNOSIS — Z85828 Personal history of other malignant neoplasm of skin: Secondary | ICD-10-CM | POA: Diagnosis not present

## 2022-06-18 DIAGNOSIS — E785 Hyperlipidemia, unspecified: Secondary | ICD-10-CM | POA: Diagnosis not present

## 2022-06-18 DIAGNOSIS — R739 Hyperglycemia, unspecified: Secondary | ICD-10-CM | POA: Diagnosis not present

## 2022-06-18 DIAGNOSIS — G2581 Restless legs syndrome: Secondary | ICD-10-CM | POA: Diagnosis not present

## 2022-06-18 DIAGNOSIS — I959 Hypotension, unspecified: Secondary | ICD-10-CM | POA: Diagnosis not present

## 2022-06-18 DIAGNOSIS — I739 Peripheral vascular disease, unspecified: Secondary | ICD-10-CM | POA: Diagnosis not present

## 2022-06-18 DIAGNOSIS — I5033 Acute on chronic diastolic (congestive) heart failure: Secondary | ICD-10-CM | POA: Diagnosis not present

## 2022-06-18 DIAGNOSIS — M5137 Other intervertebral disc degeneration, lumbosacral region: Secondary | ICD-10-CM | POA: Diagnosis not present

## 2022-06-18 DIAGNOSIS — I4821 Permanent atrial fibrillation: Secondary | ICD-10-CM | POA: Diagnosis not present

## 2022-06-18 DIAGNOSIS — I2510 Atherosclerotic heart disease of native coronary artery without angina pectoris: Secondary | ICD-10-CM | POA: Diagnosis not present

## 2022-06-18 DIAGNOSIS — I110 Hypertensive heart disease with heart failure: Secondary | ICD-10-CM | POA: Diagnosis not present

## 2022-06-18 DIAGNOSIS — E876 Hypokalemia: Secondary | ICD-10-CM | POA: Diagnosis not present

## 2022-06-18 DIAGNOSIS — Z7901 Long term (current) use of anticoagulants: Secondary | ICD-10-CM | POA: Diagnosis not present

## 2022-06-18 DIAGNOSIS — Z87891 Personal history of nicotine dependence: Secondary | ICD-10-CM | POA: Diagnosis not present

## 2022-06-18 DIAGNOSIS — M810 Age-related osteoporosis without current pathological fracture: Secondary | ICD-10-CM | POA: Diagnosis not present

## 2022-06-18 DIAGNOSIS — I071 Rheumatic tricuspid insufficiency: Secondary | ICD-10-CM | POA: Diagnosis not present

## 2022-06-18 DIAGNOSIS — M1990 Unspecified osteoarthritis, unspecified site: Secondary | ICD-10-CM | POA: Diagnosis not present

## 2022-06-18 DIAGNOSIS — A419 Sepsis, unspecified organism: Secondary | ICD-10-CM | POA: Diagnosis not present

## 2022-06-18 DIAGNOSIS — K449 Diaphragmatic hernia without obstruction or gangrene: Secondary | ICD-10-CM | POA: Diagnosis not present

## 2022-06-18 DIAGNOSIS — K219 Gastro-esophageal reflux disease without esophagitis: Secondary | ICD-10-CM | POA: Diagnosis not present

## 2022-06-18 DIAGNOSIS — J9601 Acute respiratory failure with hypoxia: Secondary | ICD-10-CM | POA: Diagnosis not present

## 2022-06-18 DIAGNOSIS — G629 Polyneuropathy, unspecified: Secondary | ICD-10-CM | POA: Diagnosis not present

## 2022-06-18 NOTE — Telephone Encounter (Signed)
Home Health verbal orders [place] Name: [place] number: [phone]  Requesting OT/PT/Skilled nursing/Social Work/Speech:  Reason:PT  Frequency:2 x w 4 w , 1 x w  4 w  Please forward to [facility] pool or providers CMA

## 2022-06-18 NOTE — Telephone Encounter (Signed)
Agree with this. Thanks.  

## 2022-06-19 ENCOUNTER — Encounter: Payer: Self-pay | Admitting: Family Medicine

## 2022-06-19 ENCOUNTER — Telehealth: Payer: Self-pay | Admitting: Family Medicine

## 2022-06-19 ENCOUNTER — Ambulatory Visit (INDEPENDENT_AMBULATORY_CARE_PROVIDER_SITE_OTHER): Payer: Medicare Other | Admitting: Family Medicine

## 2022-06-19 VITALS — BP 104/74 | HR 82 | Temp 97.5°F | Ht 62.0 in

## 2022-06-19 DIAGNOSIS — G2581 Restless legs syndrome: Secondary | ICD-10-CM | POA: Diagnosis not present

## 2022-06-19 DIAGNOSIS — Z87891 Personal history of nicotine dependence: Secondary | ICD-10-CM | POA: Diagnosis not present

## 2022-06-19 DIAGNOSIS — M1990 Unspecified osteoarthritis, unspecified site: Secondary | ICD-10-CM | POA: Diagnosis not present

## 2022-06-19 DIAGNOSIS — I10 Essential (primary) hypertension: Secondary | ICD-10-CM | POA: Diagnosis not present

## 2022-06-19 DIAGNOSIS — J9601 Acute respiratory failure with hypoxia: Secondary | ICD-10-CM | POA: Diagnosis not present

## 2022-06-19 DIAGNOSIS — R627 Adult failure to thrive: Secondary | ICD-10-CM

## 2022-06-19 DIAGNOSIS — K449 Diaphragmatic hernia without obstruction or gangrene: Secondary | ICD-10-CM | POA: Diagnosis not present

## 2022-06-19 DIAGNOSIS — Z515 Encounter for palliative care: Secondary | ICD-10-CM | POA: Diagnosis not present

## 2022-06-19 DIAGNOSIS — R739 Hyperglycemia, unspecified: Secondary | ICD-10-CM | POA: Diagnosis not present

## 2022-06-19 DIAGNOSIS — A419 Sepsis, unspecified organism: Secondary | ICD-10-CM | POA: Diagnosis not present

## 2022-06-19 DIAGNOSIS — E876 Hypokalemia: Secondary | ICD-10-CM | POA: Diagnosis not present

## 2022-06-19 DIAGNOSIS — K219 Gastro-esophageal reflux disease without esophagitis: Secondary | ICD-10-CM | POA: Diagnosis not present

## 2022-06-19 DIAGNOSIS — I5033 Acute on chronic diastolic (congestive) heart failure: Secondary | ICD-10-CM | POA: Diagnosis not present

## 2022-06-19 DIAGNOSIS — I4819 Other persistent atrial fibrillation: Secondary | ICD-10-CM | POA: Diagnosis not present

## 2022-06-19 DIAGNOSIS — I2510 Atherosclerotic heart disease of native coronary artery without angina pectoris: Secondary | ICD-10-CM | POA: Diagnosis not present

## 2022-06-19 DIAGNOSIS — U071 COVID-19: Secondary | ICD-10-CM

## 2022-06-19 DIAGNOSIS — I4821 Permanent atrial fibrillation: Secondary | ICD-10-CM | POA: Diagnosis not present

## 2022-06-19 DIAGNOSIS — M5137 Other intervertebral disc degeneration, lumbosacral region: Secondary | ICD-10-CM | POA: Diagnosis not present

## 2022-06-19 DIAGNOSIS — U099 Post covid-19 condition, unspecified: Secondary | ICD-10-CM | POA: Diagnosis not present

## 2022-06-19 DIAGNOSIS — G3184 Mild cognitive impairment, so stated: Secondary | ICD-10-CM

## 2022-06-19 DIAGNOSIS — Z7901 Long term (current) use of anticoagulants: Secondary | ICD-10-CM | POA: Diagnosis not present

## 2022-06-19 DIAGNOSIS — Z8619 Personal history of other infectious and parasitic diseases: Secondary | ICD-10-CM | POA: Diagnosis not present

## 2022-06-19 DIAGNOSIS — I739 Peripheral vascular disease, unspecified: Secondary | ICD-10-CM | POA: Diagnosis not present

## 2022-06-19 DIAGNOSIS — R531 Weakness: Secondary | ICD-10-CM

## 2022-06-19 DIAGNOSIS — Z85828 Personal history of other malignant neoplasm of skin: Secondary | ICD-10-CM | POA: Diagnosis not present

## 2022-06-19 DIAGNOSIS — Z66 Do not resuscitate: Secondary | ICD-10-CM

## 2022-06-19 DIAGNOSIS — G629 Polyneuropathy, unspecified: Secondary | ICD-10-CM | POA: Diagnosis not present

## 2022-06-19 DIAGNOSIS — M810 Age-related osteoporosis without current pathological fracture: Secondary | ICD-10-CM | POA: Diagnosis not present

## 2022-06-19 DIAGNOSIS — I959 Hypotension, unspecified: Secondary | ICD-10-CM | POA: Diagnosis not present

## 2022-06-19 DIAGNOSIS — M069 Rheumatoid arthritis, unspecified: Secondary | ICD-10-CM | POA: Diagnosis not present

## 2022-06-19 DIAGNOSIS — I110 Hypertensive heart disease with heart failure: Secondary | ICD-10-CM | POA: Diagnosis not present

## 2022-06-19 DIAGNOSIS — I071 Rheumatic tricuspid insufficiency: Secondary | ICD-10-CM | POA: Diagnosis not present

## 2022-06-19 DIAGNOSIS — E785 Hyperlipidemia, unspecified: Secondary | ICD-10-CM | POA: Diagnosis not present

## 2022-06-19 NOTE — Telephone Encounter (Signed)
Left another message on voicemail for [name] to call the office back. Office hours given.

## 2022-06-19 NOTE — Telephone Encounter (Signed)
Will evaluate today in office.

## 2022-06-19 NOTE — Patient Instructions (Addendum)
Drop lisinopril to '10mg'$  daily in am for now. Continue monitoring blood pressures.  Drop ropinirole to '2mg'$  nightly Will await palliative care eval tomorrow. Touch base with home health social worker about possible need for further rehab at nursing facility. If needed I can fill out FL2 form.  Good to see you today.  Keep appointment in April unless symptoms much better.

## 2022-06-19 NOTE — Telephone Encounter (Signed)
Left a message on voicemail for [place] to call the office back.

## 2022-06-19 NOTE — Progress Notes (Unsigned)
Patient ID: [name], female    DOB: 1929/12/08, 89 y.o.   MRN: [id number]  This visit was conducted in person.  BP 104/74   Pulse 82   Temp (!) 97.5 F (36.4 C) (Temporal)   Ht '5\' 2"'$  (1.575 m)   SpO2 93%   BMI 26.85 kg/m   BP Readings from Last 3 Encounters:  06/19/22 104/74  06/11/22 (!) 145/77  05/08/22 (!) 152/80    CC: hosp f/u visit  Subjective:   HPI: [name] is a 89 y.o. female presenting on 06/19/2022 for Hospitalization Follow-up (Admitted on 06/02/22 at [facility], dx acute sepsis; generalized weakness; hypokalemia; acute respiratory failure w/ hypoxia. Pt brought in home BP monitor and pulse Ox to compare. Reading in office today:  BP- 103/79, O2- 98. Pt accompanied by daughter, [name]. )   Recent hospitalization for acute onset generalized weakness and inability to walk - found to have marked hypokalemia along with testing positive for COVID (sepsis) complicated by hypoxia.  Treated with paxlovid and IV steroids, complicated by volume overload leading to severe respiratory distress. Paxlovid was discontinued due to hypoxia.  Hospital records reviewed. Med rec performed.   Since home "feeling well".   Having episodes of dyspnea associated with AMS and mental fog - very slowed during this time,  Marked decreased conversationality since COVID illness. New apraxia.  BP significantly lower than previously.  Much more unsure of herself, needing instructions.  She is sleeping a lot - but sleeping restlessly.  Dry cough persists, ongoing intermittent dyspnea episodes as per above.  No fevers/chills, dysuria.  No one sided weakness/numbness, slurred speech, vision changes, double vision, headache.   Gabapentin, tizanidine held by [facility]. Doing well without neuropathy symptoms.  Also noted improvement in RLS symptoms even prior to COVID.  Friend of family is caregiver aide.   Home BP readings running 104-124/64-94, HR 70-200, O2 91-93% RA.   Home health was  set up with [facility].  Other follow up appointments scheduled: palliative care home appt planned tomorrow.  ______________________________________________________________________ Hospital admission: 06/02/2022 Hospital discharge: 06/11/2022 TCM f/u phone call:  performed [id number]  Recommendations for Outpatient Follow-up:  Pcp f/u    Discharge Diagnoses:  Principal Problem:   Sepsis due to COVID-19 [facility]) Active Problems:   Hypokalemia   Generalized weakness   Essential hypertension   Persistent atrial fibrillation (HCC)   Peripheral neuropathy   Acute respiratory failure with hypoxia (HCC)   Acute on chronic diastolic CHF (congestive heart failure) (HCC)   Dyslipidemia   GERD without esophagitis   Hypotension   Moderate tricuspid regurgitation   Discharge Condition: improved Diet recommendation: heart healthy     Relevant past medical, surgical, family and social history reviewed and updated as indicated. Interim medical history since our last visit reviewed. Allergies and medications reviewed and updated. Outpatient Medications Prior to Visit  Medication Sig Dispense Refill   acetaminophen (TYLENOL) 500 MG tablet Take 500 mg by mouth every 6 (six) hours as needed for mild pain.     acyclovir (ZOVIRAX) 400 MG tablet Take 400 mg by mouth 2 (two) times daily.     Alpha-Lipoic Acid 600 MG TABS Take 600 mg by mouth daily.     atorvastatin (LIPITOR) 20 MG tablet Take 1 tablet (20 mg total) by mouth daily. 90 tablet 3   Docusate Calcium (STOOL SOFTENER PO) Take 1 capsule by mouth daily.     DULoxetine (CYMBALTA) 60 MG capsule TAKE 1 CAPSULE BY MOUTH ONCE  DAILY 90 capsule 2   ELIQUIS 5 MG TABS tablet TAKE 1 TABLET BY MOUTH TWICE DAILY 180 tablet 1   furosemide (LASIX) 20 MG tablet TAKE ONE TABLET BY MOUTH ONCE DAILY 90 tablet 1   hydrocortisone (ANUSOL-HC) 2.5 % rectal cream Place 1 application rectally daily as needed for hemorrhoids or anal itching. For hemmorhoid 30 g 0    memantine (NAMENDA) 10 MG tablet Take 1 tablet (10 mg total) by mouth 2 (two) times daily. 180 tablet 3   metoprolol succinate (TOPROL-XL) 100 MG 24 hr tablet TAKE 1 TABLET BY MOUTH TWICE DAILY 180 tablet 0   mometasone (ELOCON) 0.1 % lotion 2-4 drops daily.     pantoprazole (PROTONIX) 40 MG tablet Take 1 tablet (40 mg total) by mouth daily. 90 tablet 3   polyethylene glycol (MIRALAX / GLYCOLAX) 17 g packet Take 17 g by mouth daily as needed for mild constipation. 14 each 0   prednisoLONE acetate (PRED FORTE) 1 % ophthalmic suspension Place 1 drop into the left eye daily.      Vitamin D, Ergocalciferol, (DRISDOL) 1.25 MG (50000 UNIT) CAPS capsule Take 1 capsule (50,000 Units total) by mouth every 7 (seven) days. 12 capsule 3   lisinopril (ZESTRIL) 20 MG tablet TAKE ONE TABLET BY MOUTH ONCE DAILY 90 tablet 0   rOPINIRole (REQUIP) 1 MG tablet Take 2.5 tablets (2.5 mg total) by mouth at bedtime. 225 tablet 3   gabapentin (NEURONTIN) 400 MG capsule TAKE 1 CAPSULE BY MOUTH AT BEDTIME AND SECOND DOSE DURING THE DAY AS NEEDED (Patient not taking: Reported on 06/19/2022) 120 capsule 3   lisinopril (ZESTRIL) 20 MG tablet Take 0.5 tablets (10 mg total) by mouth daily.     rOPINIRole (REQUIP) 1 MG tablet Take 2 tablets (2 mg total) by mouth at bedtime.     traMADol (ULTRAM) 50 MG tablet Take 1-2 tablets (50-100 mg total) by mouth at bedtime as needed. (Patient not taking: Reported on 06/19/2022) 30 tablet 0   tiZANidine (ZANAFLEX) 2 MG tablet Take 2 mg by mouth daily. (Patient not taking: Reported on 06/19/2022)     No facility-administered medications prior to visit.     Per HPI unless specifically indicated in ROS section below Review of Systems  Objective:  BP 104/74   Pulse 82   Temp (!) 97.5 F (36.4 C) (Temporal)   Ht '5\' 2"'$  (1.575 m)   SpO2 93%   BMI 26.85 kg/m   Wt Readings from Last 3 Encounters:  06/11/22 146 lb 13.2 oz (66.6 kg)  05/01/22 148 lb (67.1 kg)  02/20/22 146 lb 3.2 oz (66.3  kg)      Physical Exam Vitals and nursing note reviewed.  Constitutional:      Appearance: Normal appearance. She is not ill-appearing.     Comments:  Sitting in transport chair Slowed responses  HENT:     Head: Normocephalic and atraumatic.     Mouth/Throat:     Mouth: Mucous membranes are moist.     Pharynx: Oropharynx is clear. No oropharyngeal exudate or posterior oropharyngeal erythema.  Eyes:     Extraocular Movements: Extraocular movements intact.     Conjunctiva/sclera: Conjunctivae normal.     Pupils: Pupils are equal, round, and reactive to light.  Cardiovascular:     Rate and Rhythm: Normal rate and regular rhythm.     Pulses: Normal pulses.     Heart sounds: Normal heart sounds. No murmur heard. Pulmonary:     Effort: Pulmonary  effort is normal. No respiratory distress.     Breath sounds: Normal breath sounds. No wheezing, rhonchi or rales.  Musculoskeletal:     Right lower leg: No edema.     Left lower leg: No edema.     Comments: Diminished DP bilaterally  Skin:    General: Skin is warm and dry.     Findings: No rash.  Neurological:     Mental Status: She is alert.     Comments: Slowly stands unassisted but unable to bear weight on her own and has to immediately sit back down  Psychiatric:        Mood and Affect: Mood normal.        Behavior: Behavior normal.       Results for orders placed or performed in visit on 06/14/22  Urine Culture   Specimen: Urine  Result Value Ref Range   MICRO NUMBER: [id number]    SPECIMEN QUALITY: Adequate    Sample Source URINE    STATUS: FINAL    Result:      Mixed genital flora isolated. These superficial bacteria are not indicative of a urinary tract infection. No further organism identification is warranted on this specimen. If clinically indicated, recollect clean-catch, mid-stream urine and transfer  immediately to Urine Culture Transport Tube.    *Note: Due to a large number of results and/or encounters for the  requested time period, some results have not been displayed. A complete set of results can be found in Results Review.    Assessment & Plan:   Problem List Items Addressed This Visit     Palliative care by specialist (Chronic)    Pending outpatient palliative care evaluation for tomorrow 0 appreciate their care      DNR (do not resuscitate) (Chronic)   Essential hypertension    BP currently running low for her (previously ran A999333 systolic) - hypoperfusion could contribute to noted AMS - will drop lisinopril to '10mg'$  daily, continue Toprol XL '100mg'$  bid      Relevant Medications   lisinopril (ZESTRIL) 20 MG tablet   RLS (restless legs syndrome)    This is actually better - will drop requip dose to '2mg'$  nightly.       Persistent atrial fibrillation (HCC)    Continue eliquis and metoprolol      Relevant Medications   lisinopril (ZESTRIL) 20 MG tablet   MCI (mild cognitive impairment) with memory loss    Worsened after COVID. Continue namenda '10mg'$  bid.       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  Ddx includes CVA due to ischemic insult or post-COVID hypercoagulable state - no head imaging performed while in hospital. Discussed possibly checking head imaging if no improvement noted.  Daughter remains worried about marked weakness with concern over safety at home - see below.  Will keep close f/u April 2024. Agree with holding gabapentin and tizanidine for now.       FTT (failure to thrive) in adult    Struggling at home after COVID hospitalization. Limited benefit noted with [facility] therapy, daughter feels strength is actually worsening.  Discussed possibility of temporary SNF placement for more intense therapy and  further assistance. She will look into  this option - I will ask to add [facility] social work for this reason.       RESOLVED: Sepsis due to COVID-19 [facility])    Sepsis physiology has resolved. Post-covid symptoms persist as per below.         No orders of the defined types were placed in this encounter.   No orders of the defined types were placed in this encounter.   Patient Instructions  Drop lisinopril to '10mg'$  daily in am for now. Continue monitoring blood pressures.  Drop ropinirole to '2mg'$  nightly Will await palliative care eval tomorrow. Touch base with home health social worker about possible need for further rehab at nursing facility. If needed I can fill out FL2 form.  Good to see you today.  Keep appointment in April unless symptoms much better.   Follow up plan: Return if symptoms worsen or fail to improve.  Ria Bush, MD

## 2022-06-19 NOTE — Telephone Encounter (Signed)
Pt's daughter sent this list of concerns for the pt's ov today, 3/13 @ 3. Call back # [phone]

## 2022-06-19 NOTE — Telephone Encounter (Signed)
Esther OT from [facility] called in to make PCP aware that pt had a brief period of unconsciousness and seem weak  # [phone]

## 2022-06-20 ENCOUNTER — Encounter: Payer: Self-pay | Admitting: Family Medicine

## 2022-06-20 ENCOUNTER — Other Ambulatory Visit: Payer: Medicare Other

## 2022-06-20 VITALS — BP 92/68 | HR 93 | Temp 97.6°F

## 2022-06-20 DIAGNOSIS — U099 Post covid-19 condition, unspecified: Secondary | ICD-10-CM | POA: Insufficient documentation

## 2022-06-20 DIAGNOSIS — Z515 Encounter for palliative care: Secondary | ICD-10-CM

## 2022-06-20 DIAGNOSIS — R627 Adult failure to thrive: Secondary | ICD-10-CM | POA: Insufficient documentation

## 2022-06-20 NOTE — Assessment & Plan Note (Signed)
Pending outpatient palliative care evaluation for tomorrow 0 appreciate their care

## 2022-06-20 NOTE — Assessment & Plan Note (Addendum)
Continue eliquis and metoprolol. 

## 2022-06-20 NOTE — Assessment & Plan Note (Signed)
This is actually better - will drop requip dose to '2mg'$  nightly.

## 2022-06-20 NOTE — Assessment & Plan Note (Signed)
Post-COVID related weakness

## 2022-06-20 NOTE — Assessment & Plan Note (Addendum)
Many of her current symptoms likely related to post-COVID sequelae in setting of slower recovery due to advanced age - ongoing dyspnea without hypoxia, dry cough, mental fog with slowed cognition.  Ddx includes CVA due to ischemic insult or post-COVID hypercoagulable state - no head imaging performed while in hospital. Discussed possibly checking head imaging if no improvement noted.  Daughter remains worried about marked weakness with concern over safety at home - see below.  Will keep close f/u April 2024. Agree with holding gabapentin and tizanidine for now.

## 2022-06-20 NOTE — Assessment & Plan Note (Addendum)
Sepsis physiology has resolved. Post-covid symptoms persist as per below.

## 2022-06-20 NOTE — Progress Notes (Signed)
PATIENT NAME: [name] DOB: 1929/04/29 MRN: [id number]  PRIMARY CARE PROVIDER: Ria Bush, MD  RESPONSIBLE PARTY:  Acct ID - Guarantor Home Phone Work Phone Relationship Acct Type  [id number] Lewie [phone]  Self P/F     [address] [place], [place], [place] [address]    Home visit completed with patient, daughter-Patricia and private caregiver.   Met with daughter in lobby prior to seeing patient.  She shared patient's decline over the last month.  Appetite:  Decrease in appetite reported.  Ate a small bowl of sherbet ice, bites of a sandwich and a few cheese puffs.  Sipping on liquids during this visit.   ACP:  DNR in place.  Daughter holds HCPOA and POA for patient.  We discussed goals of care.  Daughter would like for patient to remain in her apartment as long as possible but acknowledges they may not be able to sustain this financially.  There has been discussion on rehab placement.  She has requested [place] SW to complete an assessment to see if this would be appropriate.  Uncertain when this will occur.  We discussed patient's inability to progress with home health and she is 2 weeks into services.  Discussed patient's quality of life vs quantity.  Daughter acknowledges she will need to think about this more and discuss with her siblings.  She sees patient is struggling with therapy and is uncertain how much longer patient will be able to remain with services.  Daughter would like to continue at this time.  She has a follow up visit with PCP again in 2 weeks to reassess patient's overall condition. I will schedule a follow up visit at the end of this month to reassess.   Cognitive Decline:  Daughter reports decline since hospitalization with COVID-19.  Brain fog, difficulty following directions without physical cues and confusion.  Uncertain if this will be patient's new baseline or if she will recover to previous baseline.  Daughter questioned if patient possibly had  a CVA.  She has discussed with PCP and may pursue CT of head to rule out CVA.  Functional Decline:  Needs assistance with ADL's.  Able to feed herself.  Now using a bedside commode as she is having difficulty ambulating long distances.  Patient is no longer able to remain in the home by herself.  Family is staying with patient at night.  She has private sitters in the day.  Hospice Care: Discussed hospice at length with daughter.  Explained philosophy, services provided and support this will offer.  Daughter is open to services if patient does not improve.    Palliative Care:  Services explained to daughter and consent signed.   Shortness of breath:  Patient having spells of shortness of breath/weakness only when she is exerting herself.  Attempted to ambulate patient to the scale to obtain weight.  She took 2 steps became short of breath and needed to sit.  Assisted patient back to her recliner chair.  Daughter reports this has occurred 5x this week only with exertion.  She advised OT witnessed this on last visit.  Obtained blood pressure 102/68 pulse 83 and O2 sats 99%.     CODE STATUS: DNR-form in the home. ADVANCED DIRECTIVES: Yes MOST FORM: No PPS: 40%   PHYSICAL EXAM:   VITALS: Today's Vitals   06/20/22 1311  BP: 92/68  Pulse: 93  Temp: 97.6 F (36.4 C)  SpO2: 92%    LUNGS: clear to auscultation  CARDIAC:  Cor irreg, irreg RRR} EXTREMITIES: -for edema SKIN: Skin color, texture, turgor normal. No rashes or lesions or normal  NEURO: positive for gait problems, memory problems, and weakness       Lorenza Burton, RN

## 2022-06-20 NOTE — Assessment & Plan Note (Signed)
Struggling at home after [place] hospitalization. Limited benefit noted with [facility] therapy, daughter feels strength is actually worsening.  Discussed possibility of temporary SNF placement for more intense therapy and further assistance. She will look into this option - I will ask to add [facility] social work for this reason.

## 2022-06-20 NOTE — Assessment & Plan Note (Signed)
Worsened after COVID. Continue namenda '10mg'$  bid.

## 2022-06-20 NOTE — Telephone Encounter (Signed)
Lvm (on secured box, per recording) for [place] him Dr. Darnell Level is giving verbal order for services requested for pt.

## 2022-06-20 NOTE — Assessment & Plan Note (Addendum)
BP currently running low for her (previously ran A999333 systolic) - hypoperfusion could contribute to noted AMS - will drop lisinopril to '10mg'$  daily, continue Toprol XL '100mg'$  bid

## 2022-06-21 ENCOUNTER — Telehealth: Payer: Self-pay

## 2022-06-21 DIAGNOSIS — I4821 Permanent atrial fibrillation: Secondary | ICD-10-CM | POA: Diagnosis not present

## 2022-06-21 DIAGNOSIS — I110 Hypertensive heart disease with heart failure: Secondary | ICD-10-CM | POA: Diagnosis not present

## 2022-06-21 DIAGNOSIS — R739 Hyperglycemia, unspecified: Secondary | ICD-10-CM | POA: Diagnosis not present

## 2022-06-21 DIAGNOSIS — M5137 Other intervertebral disc degeneration, lumbosacral region: Secondary | ICD-10-CM | POA: Diagnosis not present

## 2022-06-21 DIAGNOSIS — U071 COVID-19: Secondary | ICD-10-CM | POA: Diagnosis not present

## 2022-06-21 DIAGNOSIS — I5033 Acute on chronic diastolic (congestive) heart failure: Secondary | ICD-10-CM | POA: Diagnosis not present

## 2022-06-21 DIAGNOSIS — G629 Polyneuropathy, unspecified: Secondary | ICD-10-CM | POA: Diagnosis not present

## 2022-06-21 DIAGNOSIS — Z87891 Personal history of nicotine dependence: Secondary | ICD-10-CM | POA: Diagnosis not present

## 2022-06-21 DIAGNOSIS — J9601 Acute respiratory failure with hypoxia: Secondary | ICD-10-CM | POA: Diagnosis not present

## 2022-06-21 DIAGNOSIS — M1990 Unspecified osteoarthritis, unspecified site: Secondary | ICD-10-CM | POA: Diagnosis not present

## 2022-06-21 DIAGNOSIS — I739 Peripheral vascular disease, unspecified: Secondary | ICD-10-CM | POA: Diagnosis not present

## 2022-06-21 DIAGNOSIS — A419 Sepsis, unspecified organism: Secondary | ICD-10-CM | POA: Diagnosis not present

## 2022-06-21 DIAGNOSIS — Z85828 Personal history of other malignant neoplasm of skin: Secondary | ICD-10-CM | POA: Diagnosis not present

## 2022-06-21 DIAGNOSIS — I959 Hypotension, unspecified: Secondary | ICD-10-CM | POA: Diagnosis not present

## 2022-06-21 DIAGNOSIS — I071 Rheumatic tricuspid insufficiency: Secondary | ICD-10-CM | POA: Diagnosis not present

## 2022-06-21 DIAGNOSIS — E785 Hyperlipidemia, unspecified: Secondary | ICD-10-CM | POA: Diagnosis not present

## 2022-06-21 DIAGNOSIS — K219 Gastro-esophageal reflux disease without esophagitis: Secondary | ICD-10-CM | POA: Diagnosis not present

## 2022-06-21 DIAGNOSIS — E876 Hypokalemia: Secondary | ICD-10-CM | POA: Diagnosis not present

## 2022-06-21 DIAGNOSIS — I2510 Atherosclerotic heart disease of native coronary artery without angina pectoris: Secondary | ICD-10-CM | POA: Diagnosis not present

## 2022-06-21 DIAGNOSIS — K449 Diaphragmatic hernia without obstruction or gangrene: Secondary | ICD-10-CM | POA: Diagnosis not present

## 2022-06-21 DIAGNOSIS — M069 Rheumatoid arthritis, unspecified: Secondary | ICD-10-CM | POA: Diagnosis not present

## 2022-06-21 DIAGNOSIS — M810 Age-related osteoporosis without current pathological fracture: Secondary | ICD-10-CM | POA: Diagnosis not present

## 2022-06-21 DIAGNOSIS — Z7901 Long term (current) use of anticoagulants: Secondary | ICD-10-CM | POA: Diagnosis not present

## 2022-06-21 DIAGNOSIS — G2581 Restless legs syndrome: Secondary | ICD-10-CM | POA: Diagnosis not present

## 2022-06-21 NOTE — Telephone Encounter (Signed)
Discussed at [place].

## 2022-06-21 NOTE — Telephone Encounter (Signed)
Wanted to verify that we are stopping prolia due to patient current medical conditions

## 2022-06-24 ENCOUNTER — Other Ambulatory Visit: Payer: Medicare Other

## 2022-06-24 DIAGNOSIS — I959 Hypotension, unspecified: Secondary | ICD-10-CM | POA: Diagnosis not present

## 2022-06-24 DIAGNOSIS — G629 Polyneuropathy, unspecified: Secondary | ICD-10-CM | POA: Diagnosis not present

## 2022-06-24 DIAGNOSIS — K449 Diaphragmatic hernia without obstruction or gangrene: Secondary | ICD-10-CM | POA: Diagnosis not present

## 2022-06-24 DIAGNOSIS — M810 Age-related osteoporosis without current pathological fracture: Secondary | ICD-10-CM | POA: Diagnosis not present

## 2022-06-24 DIAGNOSIS — J9601 Acute respiratory failure with hypoxia: Secondary | ICD-10-CM | POA: Diagnosis not present

## 2022-06-24 DIAGNOSIS — Z85828 Personal history of other malignant neoplasm of skin: Secondary | ICD-10-CM | POA: Diagnosis not present

## 2022-06-24 DIAGNOSIS — I5033 Acute on chronic diastolic (congestive) heart failure: Secondary | ICD-10-CM | POA: Diagnosis not present

## 2022-06-24 DIAGNOSIS — Z515 Encounter for palliative care: Secondary | ICD-10-CM

## 2022-06-24 DIAGNOSIS — I071 Rheumatic tricuspid insufficiency: Secondary | ICD-10-CM | POA: Diagnosis not present

## 2022-06-24 DIAGNOSIS — E785 Hyperlipidemia, unspecified: Secondary | ICD-10-CM | POA: Diagnosis not present

## 2022-06-24 DIAGNOSIS — Z87891 Personal history of nicotine dependence: Secondary | ICD-10-CM | POA: Diagnosis not present

## 2022-06-24 DIAGNOSIS — K219 Gastro-esophageal reflux disease without esophagitis: Secondary | ICD-10-CM | POA: Diagnosis not present

## 2022-06-24 DIAGNOSIS — I110 Hypertensive heart disease with heart failure: Secondary | ICD-10-CM | POA: Diagnosis not present

## 2022-06-24 DIAGNOSIS — G2581 Restless legs syndrome: Secondary | ICD-10-CM | POA: Diagnosis not present

## 2022-06-24 DIAGNOSIS — M069 Rheumatoid arthritis, unspecified: Secondary | ICD-10-CM | POA: Diagnosis not present

## 2022-06-24 DIAGNOSIS — Z7901 Long term (current) use of anticoagulants: Secondary | ICD-10-CM | POA: Diagnosis not present

## 2022-06-24 DIAGNOSIS — U071 COVID-19: Secondary | ICD-10-CM | POA: Diagnosis not present

## 2022-06-24 DIAGNOSIS — M5137 Other intervertebral disc degeneration, lumbosacral region: Secondary | ICD-10-CM | POA: Diagnosis not present

## 2022-06-24 DIAGNOSIS — E876 Hypokalemia: Secondary | ICD-10-CM | POA: Diagnosis not present

## 2022-06-24 DIAGNOSIS — I2510 Atherosclerotic heart disease of native coronary artery without angina pectoris: Secondary | ICD-10-CM | POA: Diagnosis not present

## 2022-06-24 DIAGNOSIS — A419 Sepsis, unspecified organism: Secondary | ICD-10-CM | POA: Diagnosis not present

## 2022-06-24 DIAGNOSIS — M1990 Unspecified osteoarthritis, unspecified site: Secondary | ICD-10-CM | POA: Diagnosis not present

## 2022-06-24 DIAGNOSIS — I739 Peripheral vascular disease, unspecified: Secondary | ICD-10-CM | POA: Diagnosis not present

## 2022-06-24 DIAGNOSIS — I4821 Permanent atrial fibrillation: Secondary | ICD-10-CM | POA: Diagnosis not present

## 2022-06-24 DIAGNOSIS — R739 Hyperglycemia, unspecified: Secondary | ICD-10-CM | POA: Diagnosis not present

## 2022-06-24 NOTE — Progress Notes (Addendum)
PATIENT NAME: [name] DOB: 11-16-1929 MRN: [id number]  PRIMARY CARE PROVIDER: Ria Bush, MD  RESPONSIBLE PARTY:  Acct ID - Guarantor Home Phone Work Phone Relationship Acct Type  [id number] THANYA, [phone]  Self P/F     [address] [place], [place], [place] [address]   I connected with Patricia-daughter for  [name] on 06/24/22 by telephone and verified that I am speaking with the correct person using two identifiers.   I discussed the limitations of evaluation and management by telemedicine. The patient expressed understanding and agreed to proceed.   Follow up call made to daughter [name] to check on patient status.  Daughter advised patient continues to decline in overall status.  They are using the transport wheelchair more often than the walker.  EMS was contacted yesterday because patient was unable to get up from her chair.  Lift assistance was needed by family.  Po intake remains poor.  Increase in sleeping.  Discussed hospice evaluation with daughter.  She would like patient to be seen by OT at 1230 and then contact me.  She does not feel there will be any changes in patient but wanted to give her another opportunity.  Daughter will call me this after to give me the okay to request hospice referral.  5 pm. Incoming call from [place].  Patient did well today with therapy.  Was able to ambulate and get dressed with assistance.  Daughter uncertain about requesting a hospice referral.  Will let me know tomorrow.  She may want to allow patient this week to work with therapy to see if she continues to improve. Will await call from daughter.    Lorenza Burton, RN

## 2022-06-25 ENCOUNTER — Ambulatory Visit: Payer: Medicare Other | Admitting: Family Medicine

## 2022-06-25 ENCOUNTER — Telehealth: Payer: Self-pay | Admitting: Family Medicine

## 2022-06-25 DIAGNOSIS — I4821 Permanent atrial fibrillation: Secondary | ICD-10-CM | POA: Diagnosis not present

## 2022-06-25 DIAGNOSIS — I071 Rheumatic tricuspid insufficiency: Secondary | ICD-10-CM | POA: Diagnosis not present

## 2022-06-25 DIAGNOSIS — M5137 Other intervertebral disc degeneration, lumbosacral region: Secondary | ICD-10-CM | POA: Diagnosis not present

## 2022-06-25 DIAGNOSIS — R739 Hyperglycemia, unspecified: Secondary | ICD-10-CM | POA: Diagnosis not present

## 2022-06-25 DIAGNOSIS — M810 Age-related osteoporosis without current pathological fracture: Secondary | ICD-10-CM | POA: Diagnosis not present

## 2022-06-25 DIAGNOSIS — Z85828 Personal history of other malignant neoplasm of skin: Secondary | ICD-10-CM | POA: Diagnosis not present

## 2022-06-25 DIAGNOSIS — A419 Sepsis, unspecified organism: Secondary | ICD-10-CM | POA: Diagnosis not present

## 2022-06-25 DIAGNOSIS — I110 Hypertensive heart disease with heart failure: Secondary | ICD-10-CM | POA: Diagnosis not present

## 2022-06-25 DIAGNOSIS — E876 Hypokalemia: Secondary | ICD-10-CM | POA: Diagnosis not present

## 2022-06-25 DIAGNOSIS — I739 Peripheral vascular disease, unspecified: Secondary | ICD-10-CM | POA: Diagnosis not present

## 2022-06-25 DIAGNOSIS — M1990 Unspecified osteoarthritis, unspecified site: Secondary | ICD-10-CM | POA: Diagnosis not present

## 2022-06-25 DIAGNOSIS — M069 Rheumatoid arthritis, unspecified: Secondary | ICD-10-CM | POA: Diagnosis not present

## 2022-06-25 DIAGNOSIS — U071 COVID-19: Secondary | ICD-10-CM | POA: Diagnosis not present

## 2022-06-25 DIAGNOSIS — Z87891 Personal history of nicotine dependence: Secondary | ICD-10-CM | POA: Diagnosis not present

## 2022-06-25 DIAGNOSIS — G629 Polyneuropathy, unspecified: Secondary | ICD-10-CM | POA: Diagnosis not present

## 2022-06-25 DIAGNOSIS — K219 Gastro-esophageal reflux disease without esophagitis: Secondary | ICD-10-CM | POA: Diagnosis not present

## 2022-06-25 DIAGNOSIS — E785 Hyperlipidemia, unspecified: Secondary | ICD-10-CM | POA: Diagnosis not present

## 2022-06-25 DIAGNOSIS — Z7901 Long term (current) use of anticoagulants: Secondary | ICD-10-CM | POA: Diagnosis not present

## 2022-06-25 DIAGNOSIS — J9601 Acute respiratory failure with hypoxia: Secondary | ICD-10-CM | POA: Diagnosis not present

## 2022-06-25 DIAGNOSIS — K449 Diaphragmatic hernia without obstruction or gangrene: Secondary | ICD-10-CM | POA: Diagnosis not present

## 2022-06-25 DIAGNOSIS — I959 Hypotension, unspecified: Secondary | ICD-10-CM | POA: Diagnosis not present

## 2022-06-25 DIAGNOSIS — I2510 Atherosclerotic heart disease of native coronary artery without angina pectoris: Secondary | ICD-10-CM | POA: Diagnosis not present

## 2022-06-25 DIAGNOSIS — G2581 Restless legs syndrome: Secondary | ICD-10-CM | POA: Diagnosis not present

## 2022-06-25 DIAGNOSIS — I5033 Acute on chronic diastolic (congestive) heart failure: Secondary | ICD-10-CM | POA: Diagnosis not present

## 2022-06-25 NOTE — Telephone Encounter (Addendum)
Spoke to [place] at [facility] and was advised that the only symptoms that the daughter told her was urine odor. Tiffany stated that they are going out to see the patient tomorrow and and collect a urine if Dr. Danise Mina orders it and gives a diagnosis. Tiffany stated that if they need to they can do an in and out catheter to make sure a clean catch is gotten.

## 2022-06-25 NOTE — Telephone Encounter (Signed)
Called and spoke to [name] patient's daughter and was advised that the odor has gotten stronger. [name] stated that the workers have told her that the urine smells stronger.  Patient's daughter stated that her mom is not urinating but also not drinking much. [name] stated that she can bring another sample here to be checked.  [name] was advised that we really need to make sure that the urine sample is a clear catch. [name] stated that she can get a sterile cup from the pharmacy and try to do it herself. [name] denies that patient has had a fever.  [name] stated that she continues to get calls about her mom being evaluated for [facility]/Palliative Care but she is not sure that she is ready for that. [name] wants to know what Dr. Danise Mina thinks about the services.  Patient's daughter stated that her mom's cognitive impairment is getting much worse and not sure if it is related to covid fog or something else.

## 2022-06-25 NOTE — Telephone Encounter (Signed)
Tiffiany from [facility] home health  called in stated pt daughter would like pt urine to be tested for possible UTI  . Advise pt would need an appointment . # [id number]

## 2022-06-25 NOTE — Telephone Encounter (Signed)
Just seen last week - please call to inquire about new symptoms.  Ok to bring urine sample in sterile container to run.

## 2022-06-25 NOTE — Telephone Encounter (Signed)
Called and order for urine collection UA and culture given to [place] at [facility] as instructed by Dr,. Danise Mina.  Called and advised patient's daughter that home health nurse will be doing a urine collection tomorrow when she comes out. [name] was given Dr. Bosie Clos message regarding palliative care and she verbalized understanding. Patient's daughter stated that she appreciated the help with this.

## 2022-06-25 NOTE — Telephone Encounter (Signed)
Ok to do urine collection for AMS. May do I/O cath if needed.  If pt doesn't improve with HH as hoped for, I think palliative care is very reasonable.

## 2022-06-26 ENCOUNTER — Telehealth: Payer: Self-pay | Admitting: Family Medicine

## 2022-06-26 DIAGNOSIS — M5137 Other intervertebral disc degeneration, lumbosacral region: Secondary | ICD-10-CM | POA: Diagnosis not present

## 2022-06-26 DIAGNOSIS — I959 Hypotension, unspecified: Secondary | ICD-10-CM | POA: Diagnosis not present

## 2022-06-26 DIAGNOSIS — I071 Rheumatic tricuspid insufficiency: Secondary | ICD-10-CM | POA: Diagnosis not present

## 2022-06-26 DIAGNOSIS — R739 Hyperglycemia, unspecified: Secondary | ICD-10-CM | POA: Diagnosis not present

## 2022-06-26 DIAGNOSIS — K219 Gastro-esophageal reflux disease without esophagitis: Secondary | ICD-10-CM | POA: Diagnosis not present

## 2022-06-26 DIAGNOSIS — I2510 Atherosclerotic heart disease of native coronary artery without angina pectoris: Secondary | ICD-10-CM | POA: Diagnosis not present

## 2022-06-26 DIAGNOSIS — I739 Peripheral vascular disease, unspecified: Secondary | ICD-10-CM | POA: Diagnosis not present

## 2022-06-26 DIAGNOSIS — K449 Diaphragmatic hernia without obstruction or gangrene: Secondary | ICD-10-CM | POA: Diagnosis not present

## 2022-06-26 DIAGNOSIS — M069 Rheumatoid arthritis, unspecified: Secondary | ICD-10-CM | POA: Diagnosis not present

## 2022-06-26 DIAGNOSIS — E785 Hyperlipidemia, unspecified: Secondary | ICD-10-CM | POA: Diagnosis not present

## 2022-06-26 DIAGNOSIS — Z87891 Personal history of nicotine dependence: Secondary | ICD-10-CM | POA: Diagnosis not present

## 2022-06-26 DIAGNOSIS — Z7901 Long term (current) use of anticoagulants: Secondary | ICD-10-CM | POA: Diagnosis not present

## 2022-06-26 DIAGNOSIS — A419 Sepsis, unspecified organism: Secondary | ICD-10-CM | POA: Diagnosis not present

## 2022-06-26 DIAGNOSIS — I110 Hypertensive heart disease with heart failure: Secondary | ICD-10-CM | POA: Diagnosis not present

## 2022-06-26 DIAGNOSIS — Z85828 Personal history of other malignant neoplasm of skin: Secondary | ICD-10-CM | POA: Diagnosis not present

## 2022-06-26 DIAGNOSIS — G2581 Restless legs syndrome: Secondary | ICD-10-CM | POA: Diagnosis not present

## 2022-06-26 DIAGNOSIS — U071 COVID-19: Secondary | ICD-10-CM | POA: Diagnosis not present

## 2022-06-26 DIAGNOSIS — E876 Hypokalemia: Secondary | ICD-10-CM | POA: Diagnosis not present

## 2022-06-26 DIAGNOSIS — M1990 Unspecified osteoarthritis, unspecified site: Secondary | ICD-10-CM | POA: Diagnosis not present

## 2022-06-26 DIAGNOSIS — J9601 Acute respiratory failure with hypoxia: Secondary | ICD-10-CM | POA: Diagnosis not present

## 2022-06-26 DIAGNOSIS — I4821 Permanent atrial fibrillation: Secondary | ICD-10-CM | POA: Diagnosis not present

## 2022-06-26 DIAGNOSIS — G629 Polyneuropathy, unspecified: Secondary | ICD-10-CM | POA: Diagnosis not present

## 2022-06-26 DIAGNOSIS — I5033 Acute on chronic diastolic (congestive) heart failure: Secondary | ICD-10-CM | POA: Diagnosis not present

## 2022-06-26 DIAGNOSIS — M810 Age-related osteoporosis without current pathological fracture: Secondary | ICD-10-CM | POA: Diagnosis not present

## 2022-06-26 NOTE — Telephone Encounter (Signed)
Agree with this. Thanks.  

## 2022-06-26 NOTE — Telephone Encounter (Signed)
Noted. Agree.

## 2022-06-26 NOTE — Telephone Encounter (Signed)
Christin rtn call. I informed her Dr. Darnell Level is giving verbal orders for ST. She verbalizes understanding.

## 2022-06-26 NOTE — Telephone Encounter (Signed)
Lvm asking Altha Harm (of [facility]) to call back. Need to inform her Dr. Darnell Level is giving verbal orders for services requested for pt.

## 2022-06-26 NOTE — Telephone Encounter (Signed)
[name] form [facility] called in to let Dr Darnell Level know that she did attempt a catherization on patient,she wasn't able to obtain any urine.Daughter would not like her to attempt again since her results last week were negative.

## 2022-06-26 NOTE — Telephone Encounter (Signed)
Home Health verbal orders [place] Name: [place] number: [phone]  Requesting OT/PT/Skilled nursing/Social Work/Speech: Speech Therapy  Reason:Cognitive Training  Frequency: Evaluation  Please forward to [facility] pool or providers CMA

## 2022-06-27 DIAGNOSIS — A419 Sepsis, unspecified organism: Secondary | ICD-10-CM | POA: Diagnosis not present

## 2022-06-27 DIAGNOSIS — I2510 Atherosclerotic heart disease of native coronary artery without angina pectoris: Secondary | ICD-10-CM | POA: Diagnosis not present

## 2022-06-27 DIAGNOSIS — J9601 Acute respiratory failure with hypoxia: Secondary | ICD-10-CM | POA: Diagnosis not present

## 2022-06-27 DIAGNOSIS — U071 COVID-19: Secondary | ICD-10-CM | POA: Diagnosis not present

## 2022-06-27 DIAGNOSIS — R739 Hyperglycemia, unspecified: Secondary | ICD-10-CM | POA: Diagnosis not present

## 2022-06-27 DIAGNOSIS — K449 Diaphragmatic hernia without obstruction or gangrene: Secondary | ICD-10-CM | POA: Diagnosis not present

## 2022-06-27 DIAGNOSIS — Z7901 Long term (current) use of anticoagulants: Secondary | ICD-10-CM | POA: Diagnosis not present

## 2022-06-27 DIAGNOSIS — G629 Polyneuropathy, unspecified: Secondary | ICD-10-CM | POA: Diagnosis not present

## 2022-06-27 DIAGNOSIS — I739 Peripheral vascular disease, unspecified: Secondary | ICD-10-CM | POA: Diagnosis not present

## 2022-06-27 DIAGNOSIS — M069 Rheumatoid arthritis, unspecified: Secondary | ICD-10-CM | POA: Diagnosis not present

## 2022-06-27 DIAGNOSIS — E876 Hypokalemia: Secondary | ICD-10-CM | POA: Diagnosis not present

## 2022-06-27 DIAGNOSIS — I071 Rheumatic tricuspid insufficiency: Secondary | ICD-10-CM | POA: Diagnosis not present

## 2022-06-27 DIAGNOSIS — M810 Age-related osteoporosis without current pathological fracture: Secondary | ICD-10-CM | POA: Diagnosis not present

## 2022-06-27 DIAGNOSIS — Z85828 Personal history of other malignant neoplasm of skin: Secondary | ICD-10-CM | POA: Diagnosis not present

## 2022-06-27 DIAGNOSIS — G2581 Restless legs syndrome: Secondary | ICD-10-CM | POA: Diagnosis not present

## 2022-06-27 DIAGNOSIS — M1990 Unspecified osteoarthritis, unspecified site: Secondary | ICD-10-CM | POA: Diagnosis not present

## 2022-06-27 DIAGNOSIS — I110 Hypertensive heart disease with heart failure: Secondary | ICD-10-CM | POA: Diagnosis not present

## 2022-06-27 DIAGNOSIS — M5137 Other intervertebral disc degeneration, lumbosacral region: Secondary | ICD-10-CM | POA: Diagnosis not present

## 2022-06-27 DIAGNOSIS — K219 Gastro-esophageal reflux disease without esophagitis: Secondary | ICD-10-CM | POA: Diagnosis not present

## 2022-06-27 DIAGNOSIS — I5033 Acute on chronic diastolic (congestive) heart failure: Secondary | ICD-10-CM | POA: Diagnosis not present

## 2022-06-27 DIAGNOSIS — E785 Hyperlipidemia, unspecified: Secondary | ICD-10-CM | POA: Diagnosis not present

## 2022-06-27 DIAGNOSIS — Z87891 Personal history of nicotine dependence: Secondary | ICD-10-CM | POA: Diagnosis not present

## 2022-06-27 DIAGNOSIS — I4821 Permanent atrial fibrillation: Secondary | ICD-10-CM | POA: Diagnosis not present

## 2022-06-27 DIAGNOSIS — I959 Hypotension, unspecified: Secondary | ICD-10-CM | POA: Diagnosis not present

## 2022-06-28 ENCOUNTER — Telehealth: Payer: Self-pay | Admitting: Family Medicine

## 2022-06-28 DIAGNOSIS — I959 Hypotension, unspecified: Secondary | ICD-10-CM

## 2022-06-28 DIAGNOSIS — M1990 Unspecified osteoarthritis, unspecified site: Secondary | ICD-10-CM

## 2022-06-28 DIAGNOSIS — I110 Hypertensive heart disease with heart failure: Secondary | ICD-10-CM

## 2022-06-28 DIAGNOSIS — F419 Anxiety disorder, unspecified: Secondary | ICD-10-CM

## 2022-06-28 DIAGNOSIS — I071 Rheumatic tricuspid insufficiency: Secondary | ICD-10-CM

## 2022-06-28 DIAGNOSIS — K449 Diaphragmatic hernia without obstruction or gangrene: Secondary | ICD-10-CM

## 2022-06-28 DIAGNOSIS — Z85828 Personal history of other malignant neoplasm of skin: Secondary | ICD-10-CM

## 2022-06-28 DIAGNOSIS — I2510 Atherosclerotic heart disease of native coronary artery without angina pectoris: Secondary | ICD-10-CM

## 2022-06-28 DIAGNOSIS — Z9181 History of falling: Secondary | ICD-10-CM

## 2022-06-28 DIAGNOSIS — M810 Age-related osteoporosis without current pathological fracture: Secondary | ICD-10-CM

## 2022-06-28 DIAGNOSIS — E876 Hypokalemia: Secondary | ICD-10-CM | POA: Diagnosis not present

## 2022-06-28 DIAGNOSIS — J9601 Acute respiratory failure with hypoxia: Secondary | ICD-10-CM | POA: Diagnosis not present

## 2022-06-28 DIAGNOSIS — G2581 Restless legs syndrome: Secondary | ICD-10-CM

## 2022-06-28 DIAGNOSIS — R739 Hyperglycemia, unspecified: Secondary | ICD-10-CM

## 2022-06-28 DIAGNOSIS — E785 Hyperlipidemia, unspecified: Secondary | ICD-10-CM | POA: Diagnosis not present

## 2022-06-28 DIAGNOSIS — K219 Gastro-esophageal reflux disease without esophagitis: Secondary | ICD-10-CM | POA: Diagnosis not present

## 2022-06-28 DIAGNOSIS — Z87891 Personal history of nicotine dependence: Secondary | ICD-10-CM

## 2022-06-28 DIAGNOSIS — I4821 Permanent atrial fibrillation: Secondary | ICD-10-CM

## 2022-06-28 DIAGNOSIS — M5137 Other intervertebral disc degeneration, lumbosacral region: Secondary | ICD-10-CM

## 2022-06-28 DIAGNOSIS — I739 Peripheral vascular disease, unspecified: Secondary | ICD-10-CM | POA: Diagnosis not present

## 2022-06-28 DIAGNOSIS — U071 COVID-19: Secondary | ICD-10-CM

## 2022-06-28 DIAGNOSIS — Z7901 Long term (current) use of anticoagulants: Secondary | ICD-10-CM

## 2022-06-28 DIAGNOSIS — I5033 Acute on chronic diastolic (congestive) heart failure: Secondary | ICD-10-CM

## 2022-06-28 DIAGNOSIS — A419 Sepsis, unspecified organism: Secondary | ICD-10-CM

## 2022-06-28 DIAGNOSIS — M069 Rheumatoid arthritis, unspecified: Secondary | ICD-10-CM | POA: Diagnosis not present

## 2022-06-28 DIAGNOSIS — G629 Polyneuropathy, unspecified: Secondary | ICD-10-CM | POA: Diagnosis not present

## 2022-06-28 NOTE — Telephone Encounter (Signed)
Agree with this. Thanks.  

## 2022-06-28 NOTE — Telephone Encounter (Signed)
Spoke with [name] informing her Dr. Darnell Level is giving verbal orders for ST.

## 2022-06-28 NOTE — Telephone Encounter (Signed)
Home Health verbal orders Caller Name:Cathy [place] Name: Dan Maker  Callback number: [phone]  Requesting OT/PT/Skilled nursing/Social Work/Speech: SPEECH  Reason:MOVE SPEECH THERAPY EVAL TO NEXT WEEK  Frequency:  Please forward to [facility] pool or providers CMA

## 2022-07-01 DIAGNOSIS — I959 Hypotension, unspecified: Secondary | ICD-10-CM | POA: Diagnosis not present

## 2022-07-01 DIAGNOSIS — I071 Rheumatic tricuspid insufficiency: Secondary | ICD-10-CM | POA: Diagnosis not present

## 2022-07-01 DIAGNOSIS — Z87891 Personal history of nicotine dependence: Secondary | ICD-10-CM | POA: Diagnosis not present

## 2022-07-01 DIAGNOSIS — A419 Sepsis, unspecified organism: Secondary | ICD-10-CM | POA: Diagnosis not present

## 2022-07-01 DIAGNOSIS — R739 Hyperglycemia, unspecified: Secondary | ICD-10-CM | POA: Diagnosis not present

## 2022-07-01 DIAGNOSIS — G2581 Restless legs syndrome: Secondary | ICD-10-CM | POA: Diagnosis not present

## 2022-07-01 DIAGNOSIS — E876 Hypokalemia: Secondary | ICD-10-CM | POA: Diagnosis not present

## 2022-07-01 DIAGNOSIS — G629 Polyneuropathy, unspecified: Secondary | ICD-10-CM | POA: Diagnosis not present

## 2022-07-01 DIAGNOSIS — M069 Rheumatoid arthritis, unspecified: Secondary | ICD-10-CM | POA: Diagnosis not present

## 2022-07-01 DIAGNOSIS — J9601 Acute respiratory failure with hypoxia: Secondary | ICD-10-CM | POA: Diagnosis not present

## 2022-07-01 DIAGNOSIS — K449 Diaphragmatic hernia without obstruction or gangrene: Secondary | ICD-10-CM | POA: Diagnosis not present

## 2022-07-01 DIAGNOSIS — U071 COVID-19: Secondary | ICD-10-CM | POA: Diagnosis not present

## 2022-07-01 DIAGNOSIS — Z7901 Long term (current) use of anticoagulants: Secondary | ICD-10-CM | POA: Diagnosis not present

## 2022-07-01 DIAGNOSIS — I4821 Permanent atrial fibrillation: Secondary | ICD-10-CM | POA: Diagnosis not present

## 2022-07-01 DIAGNOSIS — E785 Hyperlipidemia, unspecified: Secondary | ICD-10-CM | POA: Diagnosis not present

## 2022-07-01 DIAGNOSIS — M5137 Other intervertebral disc degeneration, lumbosacral region: Secondary | ICD-10-CM | POA: Diagnosis not present

## 2022-07-01 DIAGNOSIS — I739 Peripheral vascular disease, unspecified: Secondary | ICD-10-CM | POA: Diagnosis not present

## 2022-07-01 DIAGNOSIS — M810 Age-related osteoporosis without current pathological fracture: Secondary | ICD-10-CM | POA: Diagnosis not present

## 2022-07-01 DIAGNOSIS — I110 Hypertensive heart disease with heart failure: Secondary | ICD-10-CM | POA: Diagnosis not present

## 2022-07-01 DIAGNOSIS — Z85828 Personal history of other malignant neoplasm of skin: Secondary | ICD-10-CM | POA: Diagnosis not present

## 2022-07-01 DIAGNOSIS — I5033 Acute on chronic diastolic (congestive) heart failure: Secondary | ICD-10-CM | POA: Diagnosis not present

## 2022-07-01 DIAGNOSIS — I2510 Atherosclerotic heart disease of native coronary artery without angina pectoris: Secondary | ICD-10-CM | POA: Diagnosis not present

## 2022-07-01 DIAGNOSIS — M1990 Unspecified osteoarthritis, unspecified site: Secondary | ICD-10-CM | POA: Diagnosis not present

## 2022-07-01 DIAGNOSIS — K219 Gastro-esophageal reflux disease without esophagitis: Secondary | ICD-10-CM | POA: Diagnosis not present

## 2022-07-02 ENCOUNTER — Other Ambulatory Visit: Payer: Self-pay | Admitting: Cardiovascular Disease

## 2022-07-02 DIAGNOSIS — Z85828 Personal history of other malignant neoplasm of skin: Secondary | ICD-10-CM | POA: Diagnosis not present

## 2022-07-02 DIAGNOSIS — I2510 Atherosclerotic heart disease of native coronary artery without angina pectoris: Secondary | ICD-10-CM | POA: Diagnosis not present

## 2022-07-02 DIAGNOSIS — I5033 Acute on chronic diastolic (congestive) heart failure: Secondary | ICD-10-CM | POA: Diagnosis not present

## 2022-07-02 DIAGNOSIS — J9601 Acute respiratory failure with hypoxia: Secondary | ICD-10-CM | POA: Diagnosis not present

## 2022-07-02 DIAGNOSIS — I110 Hypertensive heart disease with heart failure: Secondary | ICD-10-CM | POA: Diagnosis not present

## 2022-07-02 DIAGNOSIS — E876 Hypokalemia: Secondary | ICD-10-CM | POA: Diagnosis not present

## 2022-07-02 DIAGNOSIS — Z7901 Long term (current) use of anticoagulants: Secondary | ICD-10-CM | POA: Diagnosis not present

## 2022-07-02 DIAGNOSIS — K449 Diaphragmatic hernia without obstruction or gangrene: Secondary | ICD-10-CM | POA: Diagnosis not present

## 2022-07-02 DIAGNOSIS — E785 Hyperlipidemia, unspecified: Secondary | ICD-10-CM | POA: Diagnosis not present

## 2022-07-02 DIAGNOSIS — I4821 Permanent atrial fibrillation: Secondary | ICD-10-CM | POA: Diagnosis not present

## 2022-07-02 DIAGNOSIS — M810 Age-related osteoporosis without current pathological fracture: Secondary | ICD-10-CM | POA: Diagnosis not present

## 2022-07-02 DIAGNOSIS — R739 Hyperglycemia, unspecified: Secondary | ICD-10-CM | POA: Diagnosis not present

## 2022-07-02 DIAGNOSIS — G2581 Restless legs syndrome: Secondary | ICD-10-CM | POA: Diagnosis not present

## 2022-07-02 DIAGNOSIS — K219 Gastro-esophageal reflux disease without esophagitis: Secondary | ICD-10-CM | POA: Diagnosis not present

## 2022-07-02 DIAGNOSIS — I071 Rheumatic tricuspid insufficiency: Secondary | ICD-10-CM | POA: Diagnosis not present

## 2022-07-02 DIAGNOSIS — U071 COVID-19: Secondary | ICD-10-CM | POA: Diagnosis not present

## 2022-07-02 DIAGNOSIS — A419 Sepsis, unspecified organism: Secondary | ICD-10-CM | POA: Diagnosis not present

## 2022-07-02 DIAGNOSIS — G629 Polyneuropathy, unspecified: Secondary | ICD-10-CM | POA: Diagnosis not present

## 2022-07-02 DIAGNOSIS — M069 Rheumatoid arthritis, unspecified: Secondary | ICD-10-CM | POA: Diagnosis not present

## 2022-07-02 DIAGNOSIS — M1990 Unspecified osteoarthritis, unspecified site: Secondary | ICD-10-CM | POA: Diagnosis not present

## 2022-07-02 DIAGNOSIS — I739 Peripheral vascular disease, unspecified: Secondary | ICD-10-CM | POA: Diagnosis not present

## 2022-07-02 DIAGNOSIS — M5137 Other intervertebral disc degeneration, lumbosacral region: Secondary | ICD-10-CM | POA: Diagnosis not present

## 2022-07-02 DIAGNOSIS — Z87891 Personal history of nicotine dependence: Secondary | ICD-10-CM | POA: Diagnosis not present

## 2022-07-02 DIAGNOSIS — I959 Hypotension, unspecified: Secondary | ICD-10-CM | POA: Diagnosis not present

## 2022-07-02 NOTE — Telephone Encounter (Signed)
Past notes looked like you were going to discuss at office visit. But I do not see were she has had appointment. Would you like [place] to reach out to patient to see if she would like to continue?

## 2022-07-03 ENCOUNTER — Other Ambulatory Visit (INDEPENDENT_AMBULATORY_CARE_PROVIDER_SITE_OTHER): Payer: Self-pay | Admitting: Nurse Practitioner

## 2022-07-03 DIAGNOSIS — M1990 Unspecified osteoarthritis, unspecified site: Secondary | ICD-10-CM | POA: Diagnosis not present

## 2022-07-03 DIAGNOSIS — M5137 Other intervertebral disc degeneration, lumbosacral region: Secondary | ICD-10-CM | POA: Diagnosis not present

## 2022-07-03 DIAGNOSIS — K219 Gastro-esophageal reflux disease without esophagitis: Secondary | ICD-10-CM | POA: Diagnosis not present

## 2022-07-03 DIAGNOSIS — G629 Polyneuropathy, unspecified: Secondary | ICD-10-CM | POA: Diagnosis not present

## 2022-07-03 DIAGNOSIS — G2581 Restless legs syndrome: Secondary | ICD-10-CM | POA: Diagnosis not present

## 2022-07-03 DIAGNOSIS — I2510 Atherosclerotic heart disease of native coronary artery without angina pectoris: Secondary | ICD-10-CM | POA: Diagnosis not present

## 2022-07-03 DIAGNOSIS — J9601 Acute respiratory failure with hypoxia: Secondary | ICD-10-CM | POA: Diagnosis not present

## 2022-07-03 DIAGNOSIS — I071 Rheumatic tricuspid insufficiency: Secondary | ICD-10-CM | POA: Diagnosis not present

## 2022-07-03 DIAGNOSIS — Z7901 Long term (current) use of anticoagulants: Secondary | ICD-10-CM | POA: Diagnosis not present

## 2022-07-03 DIAGNOSIS — Z87891 Personal history of nicotine dependence: Secondary | ICD-10-CM | POA: Diagnosis not present

## 2022-07-03 DIAGNOSIS — E785 Hyperlipidemia, unspecified: Secondary | ICD-10-CM | POA: Diagnosis not present

## 2022-07-03 DIAGNOSIS — K449 Diaphragmatic hernia without obstruction or gangrene: Secondary | ICD-10-CM | POA: Diagnosis not present

## 2022-07-03 DIAGNOSIS — M810 Age-related osteoporosis without current pathological fracture: Secondary | ICD-10-CM | POA: Diagnosis not present

## 2022-07-03 DIAGNOSIS — I5033 Acute on chronic diastolic (congestive) heart failure: Secondary | ICD-10-CM | POA: Diagnosis not present

## 2022-07-03 DIAGNOSIS — I959 Hypotension, unspecified: Secondary | ICD-10-CM | POA: Diagnosis not present

## 2022-07-03 DIAGNOSIS — E876 Hypokalemia: Secondary | ICD-10-CM | POA: Diagnosis not present

## 2022-07-03 DIAGNOSIS — I739 Peripheral vascular disease, unspecified: Secondary | ICD-10-CM | POA: Diagnosis not present

## 2022-07-03 DIAGNOSIS — Z9889 Other specified postprocedural states: Secondary | ICD-10-CM

## 2022-07-03 DIAGNOSIS — A419 Sepsis, unspecified organism: Secondary | ICD-10-CM | POA: Diagnosis not present

## 2022-07-03 DIAGNOSIS — Z85828 Personal history of other malignant neoplasm of skin: Secondary | ICD-10-CM | POA: Diagnosis not present

## 2022-07-03 DIAGNOSIS — U071 COVID-19: Secondary | ICD-10-CM | POA: Diagnosis not present

## 2022-07-03 DIAGNOSIS — I4821 Permanent atrial fibrillation: Secondary | ICD-10-CM | POA: Diagnosis not present

## 2022-07-03 DIAGNOSIS — R739 Hyperglycemia, unspecified: Secondary | ICD-10-CM | POA: Diagnosis not present

## 2022-07-03 DIAGNOSIS — I110 Hypertensive heart disease with heart failure: Secondary | ICD-10-CM | POA: Diagnosis not present

## 2022-07-03 DIAGNOSIS — M069 Rheumatoid arthritis, unspecified: Secondary | ICD-10-CM | POA: Diagnosis not present

## 2022-07-04 DIAGNOSIS — I4821 Permanent atrial fibrillation: Secondary | ICD-10-CM | POA: Diagnosis not present

## 2022-07-04 DIAGNOSIS — I959 Hypotension, unspecified: Secondary | ICD-10-CM | POA: Diagnosis not present

## 2022-07-04 DIAGNOSIS — M810 Age-related osteoporosis without current pathological fracture: Secondary | ICD-10-CM | POA: Diagnosis not present

## 2022-07-04 DIAGNOSIS — I071 Rheumatic tricuspid insufficiency: Secondary | ICD-10-CM | POA: Diagnosis not present

## 2022-07-04 DIAGNOSIS — I5033 Acute on chronic diastolic (congestive) heart failure: Secondary | ICD-10-CM | POA: Diagnosis not present

## 2022-07-04 DIAGNOSIS — Z87891 Personal history of nicotine dependence: Secondary | ICD-10-CM | POA: Diagnosis not present

## 2022-07-04 DIAGNOSIS — A419 Sepsis, unspecified organism: Secondary | ICD-10-CM | POA: Diagnosis not present

## 2022-07-04 DIAGNOSIS — M069 Rheumatoid arthritis, unspecified: Secondary | ICD-10-CM | POA: Diagnosis not present

## 2022-07-04 DIAGNOSIS — G629 Polyneuropathy, unspecified: Secondary | ICD-10-CM | POA: Diagnosis not present

## 2022-07-04 DIAGNOSIS — I2510 Atherosclerotic heart disease of native coronary artery without angina pectoris: Secondary | ICD-10-CM | POA: Diagnosis not present

## 2022-07-04 DIAGNOSIS — E785 Hyperlipidemia, unspecified: Secondary | ICD-10-CM | POA: Diagnosis not present

## 2022-07-04 DIAGNOSIS — Z7901 Long term (current) use of anticoagulants: Secondary | ICD-10-CM | POA: Diagnosis not present

## 2022-07-04 DIAGNOSIS — Z85828 Personal history of other malignant neoplasm of skin: Secondary | ICD-10-CM | POA: Diagnosis not present

## 2022-07-04 DIAGNOSIS — K449 Diaphragmatic hernia without obstruction or gangrene: Secondary | ICD-10-CM | POA: Diagnosis not present

## 2022-07-04 DIAGNOSIS — G2581 Restless legs syndrome: Secondary | ICD-10-CM | POA: Diagnosis not present

## 2022-07-04 DIAGNOSIS — I110 Hypertensive heart disease with heart failure: Secondary | ICD-10-CM | POA: Diagnosis not present

## 2022-07-04 DIAGNOSIS — M1990 Unspecified osteoarthritis, unspecified site: Secondary | ICD-10-CM | POA: Diagnosis not present

## 2022-07-04 DIAGNOSIS — I739 Peripheral vascular disease, unspecified: Secondary | ICD-10-CM | POA: Diagnosis not present

## 2022-07-04 DIAGNOSIS — K219 Gastro-esophageal reflux disease without esophagitis: Secondary | ICD-10-CM | POA: Diagnosis not present

## 2022-07-04 DIAGNOSIS — U071 COVID-19: Secondary | ICD-10-CM | POA: Diagnosis not present

## 2022-07-04 DIAGNOSIS — M5137 Other intervertebral disc degeneration, lumbosacral region: Secondary | ICD-10-CM | POA: Diagnosis not present

## 2022-07-04 DIAGNOSIS — E876 Hypokalemia: Secondary | ICD-10-CM | POA: Diagnosis not present

## 2022-07-04 DIAGNOSIS — J9601 Acute respiratory failure with hypoxia: Secondary | ICD-10-CM | POA: Diagnosis not present

## 2022-07-04 DIAGNOSIS — R739 Hyperglycemia, unspecified: Secondary | ICD-10-CM | POA: Diagnosis not present

## 2022-07-05 ENCOUNTER — Other Ambulatory Visit: Payer: Medicare Other

## 2022-07-05 VITALS — BP 122/78 | HR 75 | Temp 97.4°F | Wt 136.4 lb

## 2022-07-05 DIAGNOSIS — Z515 Encounter for palliative care: Secondary | ICD-10-CM

## 2022-07-05 NOTE — Progress Notes (Signed)
PATIENT NAME: [name] DOB: [birth year] MRN: [id number]  PRIMARY CARE PROVIDER: Ria Bush, MD  RESPONSIBLE PARTY:  Acct ID - Guarantor Home Phone Work Phone Relationship Acct Type  [id number] Lewie [phone]  Self P/F     [address] [place], [place], [place] [address]    Home visit completed with patient, daughter [name] and caregiver.  Appetite:  Has improved since my last visit. Averaging 2 good meals a day and eating small snacks during the day.   Weight in February 148.4 lb and this week patient is 136.4 lbs. 12 lb weight loss in the last month.  Current BMI is 24.9.   Daughter mostly concerned about liquid intake as it has been difficulty to encourage patient to drink.  They are keeping liquids beside patient and encouraging.   Functional Status:  Continues to fluctuate weekly.  Patient has been ambulatory with her rolling walker but today requires the transport chair.  Patient requires lots of prompting and cues to complete ADL'S.  Daughter reports is more incontinent of urine, making less trips to the bathroom.  Hospice vs Palliative Care:  Daughter aware we can request a hospice referral at any point.  Will try to maximize therapy at this time especially with ST.  Should patient decline daughter aware we can request a hold on home health for hospice to evaluate.  Daughter verbalized this being a difficult decision.  Up until 2-3 days ago, patient was doing more functionally but cognitive status has not improved.  Daughter has spoken to therapy about hospice and has received different opinions.  Further discussion completed with daughter regarding this.   HTN:  Lisinopril decreased to 10 mg about 3 weeks ago.  Caregivers are obtaining blood pressures daily.  Ranges from 95/78-135/70 since 06/19/22.  Patient has some dizziness when first rising from the bed or chair.   Medication Management:  Having more difficulty taking medications, starting to refuse them.    Patient to see PCP next week and daughter will work on decreasing pill burden at that time.   Resources:  Continues with home health.  OT has discharged.  ST now working with patient and PT continues 2x weekly.  Will have Palliative Care nurse navigator follow up with daughter next week and I will see patient in 2 weeks.     CODE STATUS: DNR ADVANCED DIRECTIVES: Yes MOST FORM: No PPS: 40%   PHYSICAL EXAM:   VITALS: Today's Vitals   07/05/22 1112  BP: 122/78  Pulse: 75  Temp: (!) 97.4 F (36.3 C)  SpO2: 90%  Weight: 134 lb 9.6 oz (61.1 kg)    LUNGS: clear to auscultation  CARDIAC: Cor irreg, irreg RRR}  EXTREMITIES: - for edema SKIN: Skin color, texture, turgor normal. No rashes or lesions or normal  NEURO: positive for dizziness, gait problems, and memory problems       Lorenza Burton, RN

## 2022-07-08 DIAGNOSIS — M5137 Other intervertebral disc degeneration, lumbosacral region: Secondary | ICD-10-CM | POA: Diagnosis not present

## 2022-07-08 DIAGNOSIS — K449 Diaphragmatic hernia without obstruction or gangrene: Secondary | ICD-10-CM | POA: Diagnosis not present

## 2022-07-08 DIAGNOSIS — I739 Peripheral vascular disease, unspecified: Secondary | ICD-10-CM | POA: Diagnosis not present

## 2022-07-08 DIAGNOSIS — I110 Hypertensive heart disease with heart failure: Secondary | ICD-10-CM | POA: Diagnosis not present

## 2022-07-08 DIAGNOSIS — U071 COVID-19: Secondary | ICD-10-CM | POA: Diagnosis not present

## 2022-07-08 DIAGNOSIS — Z7901 Long term (current) use of anticoagulants: Secondary | ICD-10-CM | POA: Diagnosis not present

## 2022-07-08 DIAGNOSIS — A419 Sepsis, unspecified organism: Secondary | ICD-10-CM | POA: Diagnosis not present

## 2022-07-08 DIAGNOSIS — Z85828 Personal history of other malignant neoplasm of skin: Secondary | ICD-10-CM | POA: Diagnosis not present

## 2022-07-08 DIAGNOSIS — Z87891 Personal history of nicotine dependence: Secondary | ICD-10-CM | POA: Diagnosis not present

## 2022-07-08 DIAGNOSIS — E785 Hyperlipidemia, unspecified: Secondary | ICD-10-CM | POA: Diagnosis not present

## 2022-07-08 DIAGNOSIS — I2510 Atherosclerotic heart disease of native coronary artery without angina pectoris: Secondary | ICD-10-CM | POA: Diagnosis not present

## 2022-07-08 DIAGNOSIS — J9601 Acute respiratory failure with hypoxia: Secondary | ICD-10-CM | POA: Diagnosis not present

## 2022-07-08 DIAGNOSIS — I4821 Permanent atrial fibrillation: Secondary | ICD-10-CM | POA: Diagnosis not present

## 2022-07-08 DIAGNOSIS — K219 Gastro-esophageal reflux disease without esophagitis: Secondary | ICD-10-CM | POA: Diagnosis not present

## 2022-07-08 DIAGNOSIS — I959 Hypotension, unspecified: Secondary | ICD-10-CM | POA: Diagnosis not present

## 2022-07-08 DIAGNOSIS — I071 Rheumatic tricuspid insufficiency: Secondary | ICD-10-CM | POA: Diagnosis not present

## 2022-07-08 DIAGNOSIS — G629 Polyneuropathy, unspecified: Secondary | ICD-10-CM | POA: Diagnosis not present

## 2022-07-08 DIAGNOSIS — M810 Age-related osteoporosis without current pathological fracture: Secondary | ICD-10-CM | POA: Diagnosis not present

## 2022-07-08 DIAGNOSIS — I5033 Acute on chronic diastolic (congestive) heart failure: Secondary | ICD-10-CM | POA: Diagnosis not present

## 2022-07-08 DIAGNOSIS — G2581 Restless legs syndrome: Secondary | ICD-10-CM | POA: Diagnosis not present

## 2022-07-08 DIAGNOSIS — M1990 Unspecified osteoarthritis, unspecified site: Secondary | ICD-10-CM | POA: Diagnosis not present

## 2022-07-08 DIAGNOSIS — R739 Hyperglycemia, unspecified: Secondary | ICD-10-CM | POA: Diagnosis not present

## 2022-07-08 DIAGNOSIS — E876 Hypokalemia: Secondary | ICD-10-CM | POA: Diagnosis not present

## 2022-07-08 DIAGNOSIS — M069 Rheumatoid arthritis, unspecified: Secondary | ICD-10-CM | POA: Diagnosis not present

## 2022-07-09 ENCOUNTER — Ambulatory Visit (INDEPENDENT_AMBULATORY_CARE_PROVIDER_SITE_OTHER): Payer: Medicare Other | Admitting: Family Medicine

## 2022-07-09 ENCOUNTER — Encounter: Payer: Self-pay | Admitting: Family Medicine

## 2022-07-09 VITALS — BP 130/78 | HR 92 | Temp 98.2°F | Ht 62.0 in | Wt 137.1 lb

## 2022-07-09 DIAGNOSIS — I4819 Other persistent atrial fibrillation: Secondary | ICD-10-CM | POA: Diagnosis not present

## 2022-07-09 DIAGNOSIS — N1831 Chronic kidney disease, stage 3a: Secondary | ICD-10-CM | POA: Diagnosis not present

## 2022-07-09 DIAGNOSIS — I5032 Chronic diastolic (congestive) heart failure: Secondary | ICD-10-CM

## 2022-07-09 DIAGNOSIS — I10 Essential (primary) hypertension: Secondary | ICD-10-CM | POA: Diagnosis not present

## 2022-07-09 DIAGNOSIS — U099 Post covid-19 condition, unspecified: Secondary | ICD-10-CM

## 2022-07-09 DIAGNOSIS — F331 Major depressive disorder, recurrent, moderate: Secondary | ICD-10-CM | POA: Diagnosis not present

## 2022-07-09 DIAGNOSIS — R531 Weakness: Secondary | ICD-10-CM

## 2022-07-09 DIAGNOSIS — G3184 Mild cognitive impairment, so stated: Secondary | ICD-10-CM | POA: Diagnosis not present

## 2022-07-09 DIAGNOSIS — M810 Age-related osteoporosis without current pathological fracture: Secondary | ICD-10-CM | POA: Diagnosis not present

## 2022-07-09 DIAGNOSIS — R1319 Other dysphagia: Secondary | ICD-10-CM

## 2022-07-09 DIAGNOSIS — G2581 Restless legs syndrome: Secondary | ICD-10-CM

## 2022-07-09 DIAGNOSIS — B0230 Zoster ocular disease, unspecified: Secondary | ICD-10-CM

## 2022-07-09 DIAGNOSIS — R0609 Other forms of dyspnea: Secondary | ICD-10-CM | POA: Diagnosis not present

## 2022-07-09 DIAGNOSIS — R627 Adult failure to thrive: Secondary | ICD-10-CM | POA: Diagnosis not present

## 2022-07-09 MED ORDER — DULOXETINE HCL 30 MG PO CPEP: 30.0000 mg | ORAL_CAPSULE | Freq: Every day | ORAL | 6 refills | Status: DC

## 2022-07-09 NOTE — Patient Instructions (Addendum)
Lab today  Check with eye doctor about ongoing need/dosing of acyclovir. If you come off acyclovir, consider shingrix shot series.  Drop cymbalta to 30mg  daily -new dose sent to pharmacy  Try dropping lasix dose to 20mg  MWF (3d/wk) Check with heart doctor about metoprolol succinate dosing/frequency/formulation.  Good to see you today Return as needed or in 3 months for follow up visit.

## 2022-07-09 NOTE — Progress Notes (Signed)
Patient ID: [name], female    DOB: 1929-05-18, 89 y.o.   MRN: [id number]  This visit was conducted in person.  BP 130/78   Pulse 92   Temp 98.2 F (36.8 C) (Temporal)   Ht 5\' 2"  (1.575 m)   Wt 137 lb 2 oz (62.2 kg)   SpO2 96%   BMI 25.08 kg/m    CC: 1 mo f/u visit  Subjective:   HPI: Crystal LimerickCarol S Kozikowski is a 89 y.o. female presenting on 07/09/2022 for Medical Management of Chronic Issues (Here for 4 mo f/u. Pt accompanied by daughter, Elease Hashimotoatricia. Per Elease HashimotoPatricia, pt has difficulty eating, taking meds, etc.)   See prior note for details. Hospitalized with COVID infection 05/2022 x 10 days as well as generalized weakness.  Dyspnea, altered mental status, mental fog has improved. Still staying tired, weak and more fatigued than previously - using transport chair regularly now. ?ongoing post-COVID effect vs progressive memory impairment. Daughter notes weakness worsens towards end of the day. Appetite is overall improved, sleep is more restful. They note taste preferences have changed. Limited fluid intake - about 16 oz/day.   [facility] palliative care team involved, latest note reviewed. Interested in decreasing pill burden. Now eating meds in apple sauce  Now off gabapentin, alpha lipoic acid, tizanidine.  Cutting acyclovir and metoprolol succinate tablets in half due to progressive difficulty swallowing.   HH continues - speech therapy and physical therapy, has completed OT.   RLS - back on ropinirole 2.5mg  nightly due to worsening symptoms.      Relevant past medical, surgical, family and social history reviewed and updated as indicated. Interim medical history since our last visit reviewed. Allergies and medications reviewed and updated. Outpatient Medications Prior to Visit  Medication Sig Dispense Refill   acetaminophen (TYLENOL) 500 MG tablet Take 500 mg by mouth every 6 (six) hours as needed for mild pain.     acyclovir (ZOVIRAX) 400 MG tablet Take 400 mg by mouth 2  (two) times daily.     atorvastatin (LIPITOR) 20 MG tablet Take 1 tablet (20 mg total) by mouth daily. 90 tablet 3   Docusate Calcium (STOOL SOFTENER PO) Take 1 capsule by mouth daily.     ELIQUIS 5 MG TABS tablet TAKE 1 TABLET BY MOUTH TWICE DAILY 180 tablet 1   furosemide (LASIX) 20 MG tablet TAKE ONE TABLET BY MOUTH ONCE DAILY 90 tablet 1   hydrocortisone (ANUSOL-HC) 2.5 % rectal cream Place 1 application rectally daily as needed for hemorrhoids or anal itching. For hemmorhoid 30 g 0   lisinopril (ZESTRIL) 20 MG tablet Take 0.5 tablets (10 mg total) by mouth daily.     memantine (NAMENDA) 10 MG tablet Take 1 tablet (10 mg total) by mouth 2 (two) times daily. 180 tablet 3   metoprolol succinate (TOPROL-XL) 100 MG 24 hr tablet Take 1 tablet by mouth twice daily 180 tablet 0   mometasone (ELOCON) 0.1 % lotion 2-4 drops daily.     pantoprazole (PROTONIX) 40 MG tablet Take 1 tablet (40 mg total) by mouth daily. 90 tablet 3   polyethylene glycol (MIRALAX / GLYCOLAX) 17 g packet Take 17 g by mouth daily as needed for mild constipation. 14 each 0   prednisoLONE acetate (PRED FORTE) 1 % ophthalmic suspension Place 1 drop into the left eye daily.      rOPINIRole (REQUIP) 1 MG tablet Take 2 mg by mouth at bedtime. Takes 2.5 tablets at bedtime  tizanidine (ZANAFLEX) 2 MG capsule Take 2 mg by mouth at bedtime.     traMADol (ULTRAM) 50 MG tablet Take 1-2 tablets (50-100 mg total) by mouth at bedtime as needed. 30 tablet 0   Vitamin D, Ergocalciferol, (DRISDOL) 1.25 MG (50000 UNIT) CAPS capsule Take 1 capsule (50,000 Units total) by mouth every 7 (seven) days. 12 capsule 3   DULoxetine (CYMBALTA) 60 MG capsule TAKE 1 CAPSULE BY MOUTH ONCE DAILY 90 capsule 2   Alpha-Lipoic Acid 600 MG TABS Take 600 mg by mouth daily.     gabapentin (NEURONTIN) 400 MG capsule TAKE 1 CAPSULE BY MOUTH AT BEDTIME AND SECOND DOSE DURING THE DAY AS NEEDED 120 capsule 3   No facility-administered medications prior to visit.      Per HPI unless specifically indicated in ROS section below Review of Systems  Objective:  BP 130/78   Pulse 92   Temp 98.2 F (36.8 C) (Temporal)   Ht 5\' 2"  (1.575 m)   Wt 137 lb 2 oz (62.2 kg)   SpO2 96%   BMI 25.08 kg/m   Wt Readings from Last 3 Encounters:  07/09/22 137 lb 2 oz (62.2 kg)  07/05/22 136 lb 6.4 oz (61.9 kg)  06/11/22 146 lb 13.2 oz (66.6 kg)      Physical Exam Vitals and nursing note reviewed.  Constitutional:      Appearance: Normal appearance. She is not ill-appearing.  Cardiovascular:     Rate and Rhythm: Normal rate and regular rhythm.     Pulses: Normal pulses.     Heart sounds: Normal heart sounds. No murmur heard. Pulmonary:     Effort: Pulmonary effort is normal. No respiratory distress.     Breath sounds: Normal breath sounds. No wheezing, rhonchi or rales.  Musculoskeletal:     Right lower leg: No edema.     Left lower leg: No edema.     Comments: Diminished pedal pulses  Skin:    General: Skin is cool and dry.     Findings: No rash.     Comments: Purplish hue to bilateral feet  Neurological:     Mental Status: She is alert.  Psychiatric:        Mood and Affect: Mood normal.        Behavior: Behavior normal.       Results for orders placed or performed in visit on 07/09/22  Renal function panel  Result Value Ref Range   Sodium 141 135 - 145 mEq/L   Potassium 3.1 (L) 3.5 - 5.1 mEq/L   Chloride 97 96 - 112 mEq/L   CO2 33 (H) 19 - 32 mEq/L   Albumin 3.9 3.5 - 5.2 g/dL   BUN 15 6 - 23 mg/dL   Creatinine, Ser 6.96 0.40 - 1.20 mg/dL   Glucose, Bld 295 (H) 70 - 99 mg/dL   Phosphorus 4.5 2.3 - 4.6 mg/dL   GFR 28.41 (L) >32.44 mL/min   Calcium 9.9 8.4 - 10.5 mg/dL   *Note: Due to a large number of results and/or encounters for the requested time period, some results have not been displayed. A complete set of results can be found in Results Review.  CrCl = 36  Assessment & Plan:   Problem List Items Addressed This Visit     MDD  (major depressive disorder), recurrent episode    Mood overall stable - will drop duloxetine to 30mg  daily in an effort to slowly taper medications.  Relevant Medications   DULoxetine (CYMBALTA) 30 MG capsule   Essential hypertension    Chronic, stable. Continue current regimen including lower lisinopril dose , toprol XL  daily. .       Osteoporosis    Overdue for rpt prolia (last done 09/2021) - check renal panel then will offer rpt.  Latest DEXA 2021 with elevated fracture risk, followed by newly noted compression fracture T5/L1 on imaging.  CrCl = 36 - ok to proceed with prolia.       Relevant Orders   Renal function panel (Completed)   RLS (restless legs syndrome)    Back up to 2.5mg  nightly requip due to worsening RLS symptoms on  dose      Persistent atrial fibrillation    She continues eliquis and toprol XL.  I asked her to check with cardiology about changing beta blockers due to size of Toprol XL pill.       Esophageal dysphagia    Possibly worsening recently - will work towards minimizing medications.       Dyspnea on exertion    Worsened post-COVID - this seems to now have improved      CKD (chronic kidney disease) stage 3, GFR 30-59 ml/min    Update renal panel in setting of needing to repeat prolia injection.       Chronic heart failure with preserved ejection fraction (HFpEF)    Seems euvolemic - discussed trying dropping lasix dose to MWF in an effort to simplify medication regimen, monitoring weight and respiratory status on lower dose.       MCI (mild cognitive impairment) with memory loss    Still worse - will continue to monitor.       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 Will continue to monitor. She remains off gabapentin.       FTT (failure to thrive) in adult    Some  improvement however not back to baseline prior to COVID infection/hospitalization.  Will continue to monitor for improvement.       Ophthalmic herpes zoster    I asked them to check with ophthalmologist about ongoing need for acyclovir vs different formulation as pt is struggling with swallowing pill due to size of tablets      Relevant Medications   tizanidine (ZANAFLEX) 2 MG capsule   DULoxetine (CYMBALTA) 30 MG capsule     Meds ordered this encounter  Medications   DULoxetine (CYMBALTA) 30 MG capsule    Sig: Take 1 capsule (30 mg total) by mouth daily. For mood and neuropathy    Dispense:  30 capsule    Refill:  6    Note new dose    Orders Placed This Encounter  Procedures   Renal function panel    Patient Instructions  Lab today  Check with eye doctor about ongoing need/dosing of acyclovir. If you come off acyclovir, consider shingrix shot series.  Drop cymbalta to  daily -new dose sent to pharmacy  Try dropping lasix dose to  MWF (3d/wk) Check with heart doctor about metoprolol succinate dosing/frequency/formulation.  Good to see you today Return as needed or in 3 months for follow up visit.   Follow up plan: Return in about 3 months (around 10/08/2022), or if symptoms worsen or fail to improve, for follow up visit.  Eustaquio Boyden, MD

## 2022-07-09 NOTE — Assessment & Plan Note (Addendum)
Overdue for rpt prolia (last done 09/2021) - check renal panel then will offer rpt.  Latest DEXA 2021 with elevated fracture risk, followed by newly noted compression fracture T5/L1 on imaging.  CrCl = 36 - ok to proceed with prolia.

## 2022-07-10 DIAGNOSIS — E785 Hyperlipidemia, unspecified: Secondary | ICD-10-CM | POA: Diagnosis not present

## 2022-07-10 DIAGNOSIS — R739 Hyperglycemia, unspecified: Secondary | ICD-10-CM | POA: Diagnosis not present

## 2022-07-10 DIAGNOSIS — M810 Age-related osteoporosis without current pathological fracture: Secondary | ICD-10-CM | POA: Diagnosis not present

## 2022-07-10 DIAGNOSIS — I071 Rheumatic tricuspid insufficiency: Secondary | ICD-10-CM | POA: Diagnosis not present

## 2022-07-10 DIAGNOSIS — I110 Hypertensive heart disease with heart failure: Secondary | ICD-10-CM | POA: Diagnosis not present

## 2022-07-10 DIAGNOSIS — G2581 Restless legs syndrome: Secondary | ICD-10-CM | POA: Diagnosis not present

## 2022-07-10 DIAGNOSIS — Z87891 Personal history of nicotine dependence: Secondary | ICD-10-CM | POA: Diagnosis not present

## 2022-07-10 DIAGNOSIS — K449 Diaphragmatic hernia without obstruction or gangrene: Secondary | ICD-10-CM | POA: Diagnosis not present

## 2022-07-10 DIAGNOSIS — M069 Rheumatoid arthritis, unspecified: Secondary | ICD-10-CM | POA: Diagnosis not present

## 2022-07-10 DIAGNOSIS — M1990 Unspecified osteoarthritis, unspecified site: Secondary | ICD-10-CM | POA: Diagnosis not present

## 2022-07-10 DIAGNOSIS — K219 Gastro-esophageal reflux disease without esophagitis: Secondary | ICD-10-CM | POA: Diagnosis not present

## 2022-07-10 DIAGNOSIS — I4821 Permanent atrial fibrillation: Secondary | ICD-10-CM | POA: Diagnosis not present

## 2022-07-10 DIAGNOSIS — Z85828 Personal history of other malignant neoplasm of skin: Secondary | ICD-10-CM | POA: Diagnosis not present

## 2022-07-10 DIAGNOSIS — I739 Peripheral vascular disease, unspecified: Secondary | ICD-10-CM | POA: Diagnosis not present

## 2022-07-10 DIAGNOSIS — G629 Polyneuropathy, unspecified: Secondary | ICD-10-CM | POA: Diagnosis not present

## 2022-07-10 DIAGNOSIS — A419 Sepsis, unspecified organism: Secondary | ICD-10-CM | POA: Diagnosis not present

## 2022-07-10 DIAGNOSIS — I2510 Atherosclerotic heart disease of native coronary artery without angina pectoris: Secondary | ICD-10-CM | POA: Diagnosis not present

## 2022-07-10 DIAGNOSIS — M5137 Other intervertebral disc degeneration, lumbosacral region: Secondary | ICD-10-CM | POA: Diagnosis not present

## 2022-07-10 DIAGNOSIS — I5033 Acute on chronic diastolic (congestive) heart failure: Secondary | ICD-10-CM | POA: Diagnosis not present

## 2022-07-10 DIAGNOSIS — U071 COVID-19: Secondary | ICD-10-CM | POA: Diagnosis not present

## 2022-07-10 DIAGNOSIS — Z7901 Long term (current) use of anticoagulants: Secondary | ICD-10-CM | POA: Diagnosis not present

## 2022-07-10 DIAGNOSIS — E876 Hypokalemia: Secondary | ICD-10-CM | POA: Diagnosis not present

## 2022-07-10 DIAGNOSIS — I959 Hypotension, unspecified: Secondary | ICD-10-CM | POA: Diagnosis not present

## 2022-07-10 DIAGNOSIS — J9601 Acute respiratory failure with hypoxia: Secondary | ICD-10-CM | POA: Diagnosis not present

## 2022-07-10 LAB — RENAL FUNCTION PANEL
Albumin: 3.9 g/dL (ref 3.5–5.2)
BUN: 15 mg/dL (ref 6–23)
CO2: 33 mEq/L — ABNORMAL HIGH (ref 19–32)
Calcium: 9.9 mg/dL (ref 8.4–10.5)
Chloride: 97 mEq/L (ref 96–112)
Creatinine, Ser: 0.99 mg/dL (ref 0.40–1.20)
GFR: 49.99 mL/min — ABNORMAL LOW (ref >60.00)
Glucose, Bld: 103 mg/dL — ABNORMAL HIGH (ref 70–99)
Phosphorus: 4.5 mg/dL (ref 2.3–4.6)
Potassium: 3.1 mEq/L — ABNORMAL LOW (ref 3.5–5.1)
Sodium: 141 mEq/L (ref 135–145)

## 2022-07-11 DIAGNOSIS — I739 Peripheral vascular disease, unspecified: Secondary | ICD-10-CM | POA: Diagnosis not present

## 2022-07-11 DIAGNOSIS — K219 Gastro-esophageal reflux disease without esophagitis: Secondary | ICD-10-CM | POA: Diagnosis not present

## 2022-07-11 DIAGNOSIS — J9601 Acute respiratory failure with hypoxia: Secondary | ICD-10-CM | POA: Diagnosis not present

## 2022-07-11 DIAGNOSIS — M069 Rheumatoid arthritis, unspecified: Secondary | ICD-10-CM | POA: Diagnosis not present

## 2022-07-11 DIAGNOSIS — I2510 Atherosclerotic heart disease of native coronary artery without angina pectoris: Secondary | ICD-10-CM | POA: Diagnosis not present

## 2022-07-11 DIAGNOSIS — M5137 Other intervertebral disc degeneration, lumbosacral region: Secondary | ICD-10-CM | POA: Diagnosis not present

## 2022-07-11 DIAGNOSIS — Z7901 Long term (current) use of anticoagulants: Secondary | ICD-10-CM | POA: Diagnosis not present

## 2022-07-11 DIAGNOSIS — E785 Hyperlipidemia, unspecified: Secondary | ICD-10-CM | POA: Diagnosis not present

## 2022-07-11 DIAGNOSIS — M810 Age-related osteoporosis without current pathological fracture: Secondary | ICD-10-CM | POA: Diagnosis not present

## 2022-07-11 DIAGNOSIS — I5033 Acute on chronic diastolic (congestive) heart failure: Secondary | ICD-10-CM | POA: Diagnosis not present

## 2022-07-11 DIAGNOSIS — I4821 Permanent atrial fibrillation: Secondary | ICD-10-CM | POA: Diagnosis not present

## 2022-07-11 DIAGNOSIS — M1990 Unspecified osteoarthritis, unspecified site: Secondary | ICD-10-CM | POA: Diagnosis not present

## 2022-07-11 DIAGNOSIS — A419 Sepsis, unspecified organism: Secondary | ICD-10-CM | POA: Diagnosis not present

## 2022-07-11 DIAGNOSIS — Z87891 Personal history of nicotine dependence: Secondary | ICD-10-CM | POA: Diagnosis not present

## 2022-07-11 DIAGNOSIS — I071 Rheumatic tricuspid insufficiency: Secondary | ICD-10-CM | POA: Diagnosis not present

## 2022-07-11 DIAGNOSIS — I110 Hypertensive heart disease with heart failure: Secondary | ICD-10-CM | POA: Diagnosis not present

## 2022-07-11 DIAGNOSIS — R739 Hyperglycemia, unspecified: Secondary | ICD-10-CM | POA: Diagnosis not present

## 2022-07-11 DIAGNOSIS — E876 Hypokalemia: Secondary | ICD-10-CM | POA: Diagnosis not present

## 2022-07-11 DIAGNOSIS — G629 Polyneuropathy, unspecified: Secondary | ICD-10-CM | POA: Diagnosis not present

## 2022-07-11 DIAGNOSIS — K449 Diaphragmatic hernia without obstruction or gangrene: Secondary | ICD-10-CM | POA: Diagnosis not present

## 2022-07-11 DIAGNOSIS — I959 Hypotension, unspecified: Secondary | ICD-10-CM | POA: Diagnosis not present

## 2022-07-11 DIAGNOSIS — Z85828 Personal history of other malignant neoplasm of skin: Secondary | ICD-10-CM | POA: Diagnosis not present

## 2022-07-11 DIAGNOSIS — U071 COVID-19: Secondary | ICD-10-CM | POA: Diagnosis not present

## 2022-07-11 DIAGNOSIS — G2581 Restless legs syndrome: Secondary | ICD-10-CM | POA: Diagnosis not present

## 2022-07-12 ENCOUNTER — Telehealth: Payer: Self-pay | Admitting: Family Medicine

## 2022-07-12 NOTE — Telephone Encounter (Signed)
Left a message on voicemail for [name] to call the office back.

## 2022-07-12 NOTE — Telephone Encounter (Signed)
Agree with this. Thanks.  

## 2022-07-12 NOTE — Telephone Encounter (Signed)
Verbal order given to [facility] by telephone as instructed.

## 2022-07-12 NOTE — Telephone Encounter (Signed)
[name] called our office back, I let her know I would send a message back and someone will be in touch with her. [phone]

## 2022-07-12 NOTE — Telephone Encounter (Signed)
Home Health verbal orders Caller Name:Cathy Agency Name: Girtha Hake  Callback number: [phone]  Requesting OT/PT/Skilled nursing/Social Work/Speech: speech therapy  Reason:cognition  Frequency:1 wk 5 wk  Please forward to [facility] pool or providers CMA

## 2022-07-12 NOTE — Telephone Encounter (Signed)
Called and spoke to patient daughter. Want to verify that for in office the copay will be 300? I see where for pharmacy that is what it is showing

## 2022-07-15 ENCOUNTER — Encounter: Payer: Self-pay | Admitting: Family Medicine

## 2022-07-15 DIAGNOSIS — B0230 Zoster ocular disease, unspecified: Secondary | ICD-10-CM | POA: Insufficient documentation

## 2022-07-15 NOTE — Assessment & Plan Note (Signed)
I asked them to check with ophthalmologist about ongoing need for acyclovir vs different formulation as pt is struggling with swallowing pill due to size of tablets

## 2022-07-15 NOTE — Assessment & Plan Note (Signed)
Still worse - will continue to monitor.

## 2022-07-15 NOTE — Telephone Encounter (Signed)
Discussed at recent OV -  CrCl = 36 - ok to proceed with prolia if daughter agrees.

## 2022-07-15 NOTE — Assessment & Plan Note (Signed)
Worsened post-COVID - this seems to now have improved

## 2022-07-15 NOTE — Assessment & Plan Note (Addendum)
She continues eliquis and toprol XL.  I asked her to check with cardiology about changing beta blockers due to size of Toprol XL pill.

## 2022-07-15 NOTE — Assessment & Plan Note (Signed)
Mood overall stable - will drop duloxetine to 30mg  daily in an effort to slowly taper medications.

## 2022-07-15 NOTE — Assessment & Plan Note (Signed)
Update renal panel in setting of needing to repeat prolia injection.

## 2022-07-15 NOTE — Assessment & Plan Note (Signed)
Possibly worsening recently - will work towards minimizing medications.

## 2022-07-15 NOTE — Assessment & Plan Note (Signed)
Back up to 2.5mg  nightly requip due to worsening RLS symptoms on 2mg  dose

## 2022-07-15 NOTE — Assessment & Plan Note (Addendum)
Seems euvolemic - discussed trying dropping lasix dose to MWF in an effort to simplify medication regimen, monitoring weight and respiratory status on lower dose.

## 2022-07-15 NOTE — Assessment & Plan Note (Addendum)
This seems to continue slowly improving however cognition remains impaired compared to baseline prior to COVID. Will continue to monitor. She remains off gabapentin.

## 2022-07-15 NOTE — Assessment & Plan Note (Signed)
Some improvement however not back to baseline prior to COVID infection/hospitalization.  Will continue to monitor for improvement.

## 2022-07-15 NOTE — Assessment & Plan Note (Addendum)
Chronic, stable. Continue current regimen including lower lisinopril dose 10mg , toprol XL 100mg  daily. Marland Kitchen

## 2022-07-16 ENCOUNTER — Encounter (INDEPENDENT_AMBULATORY_CARE_PROVIDER_SITE_OTHER): Payer: Self-pay | Admitting: Vascular Surgery

## 2022-07-16 ENCOUNTER — Encounter (INDEPENDENT_AMBULATORY_CARE_PROVIDER_SITE_OTHER): Payer: Medicare Other

## 2022-07-16 ENCOUNTER — Ambulatory Visit (INDEPENDENT_AMBULATORY_CARE_PROVIDER_SITE_OTHER): Payer: Medicare Other | Admitting: Vascular Surgery

## 2022-07-16 ENCOUNTER — Ambulatory Visit (INDEPENDENT_AMBULATORY_CARE_PROVIDER_SITE_OTHER): Payer: Medicare Other

## 2022-07-16 VITALS — BP 127/81 | HR 81 | Resp 18 | Ht 62.0 in | Wt 138.0 lb

## 2022-07-16 DIAGNOSIS — U071 COVID-19: Secondary | ICD-10-CM | POA: Diagnosis not present

## 2022-07-16 DIAGNOSIS — M5137 Other intervertebral disc degeneration, lumbosacral region: Secondary | ICD-10-CM | POA: Diagnosis not present

## 2022-07-16 DIAGNOSIS — I739 Peripheral vascular disease, unspecified: Secondary | ICD-10-CM

## 2022-07-16 DIAGNOSIS — A419 Sepsis, unspecified organism: Secondary | ICD-10-CM | POA: Diagnosis not present

## 2022-07-16 DIAGNOSIS — Z7901 Long term (current) use of anticoagulants: Secondary | ICD-10-CM | POA: Diagnosis not present

## 2022-07-16 DIAGNOSIS — I4821 Permanent atrial fibrillation: Secondary | ICD-10-CM | POA: Diagnosis not present

## 2022-07-16 DIAGNOSIS — M069 Rheumatoid arthritis, unspecified: Secondary | ICD-10-CM | POA: Diagnosis not present

## 2022-07-16 DIAGNOSIS — I071 Rheumatic tricuspid insufficiency: Secondary | ICD-10-CM | POA: Diagnosis not present

## 2022-07-16 DIAGNOSIS — E785 Hyperlipidemia, unspecified: Secondary | ICD-10-CM

## 2022-07-16 DIAGNOSIS — N1831 Chronic kidney disease, stage 3a: Secondary | ICD-10-CM | POA: Diagnosis not present

## 2022-07-16 DIAGNOSIS — I2510 Atherosclerotic heart disease of native coronary artery without angina pectoris: Secondary | ICD-10-CM | POA: Diagnosis not present

## 2022-07-16 DIAGNOSIS — K219 Gastro-esophageal reflux disease without esophagitis: Secondary | ICD-10-CM | POA: Diagnosis not present

## 2022-07-16 DIAGNOSIS — Z85828 Personal history of other malignant neoplasm of skin: Secondary | ICD-10-CM | POA: Diagnosis not present

## 2022-07-16 DIAGNOSIS — M810 Age-related osteoporosis without current pathological fracture: Secondary | ICD-10-CM | POA: Diagnosis not present

## 2022-07-16 DIAGNOSIS — G629 Polyneuropathy, unspecified: Secondary | ICD-10-CM | POA: Diagnosis not present

## 2022-07-16 DIAGNOSIS — Z87891 Personal history of nicotine dependence: Secondary | ICD-10-CM | POA: Diagnosis not present

## 2022-07-16 DIAGNOSIS — I10 Essential (primary) hypertension: Secondary | ICD-10-CM | POA: Diagnosis not present

## 2022-07-16 DIAGNOSIS — I959 Hypotension, unspecified: Secondary | ICD-10-CM | POA: Diagnosis not present

## 2022-07-16 DIAGNOSIS — E876 Hypokalemia: Secondary | ICD-10-CM | POA: Diagnosis not present

## 2022-07-16 DIAGNOSIS — I5033 Acute on chronic diastolic (congestive) heart failure: Secondary | ICD-10-CM | POA: Diagnosis not present

## 2022-07-16 DIAGNOSIS — M1990 Unspecified osteoarthritis, unspecified site: Secondary | ICD-10-CM | POA: Diagnosis not present

## 2022-07-16 DIAGNOSIS — G2581 Restless legs syndrome: Secondary | ICD-10-CM | POA: Diagnosis not present

## 2022-07-16 DIAGNOSIS — J9601 Acute respiratory failure with hypoxia: Secondary | ICD-10-CM | POA: Diagnosis not present

## 2022-07-16 DIAGNOSIS — I4819 Other persistent atrial fibrillation: Secondary | ICD-10-CM | POA: Diagnosis not present

## 2022-07-16 DIAGNOSIS — I110 Hypertensive heart disease with heart failure: Secondary | ICD-10-CM | POA: Diagnosis not present

## 2022-07-16 DIAGNOSIS — R739 Hyperglycemia, unspecified: Secondary | ICD-10-CM | POA: Diagnosis not present

## 2022-07-16 DIAGNOSIS — Z9889 Other specified postprocedural states: Secondary | ICD-10-CM | POA: Diagnosis not present

## 2022-07-16 DIAGNOSIS — K449 Diaphragmatic hernia without obstruction or gangrene: Secondary | ICD-10-CM | POA: Diagnosis not present

## 2022-07-16 NOTE — Telephone Encounter (Signed)
Can we follow up on cost patient did not think she had to pay that much last time.

## 2022-07-16 NOTE — Assessment & Plan Note (Signed)
Avoid contrast unless absolutely necessary. 

## 2022-07-16 NOTE — Progress Notes (Signed)
MRN : [id number]  [name] is a 89 y.o. (1930-02-25) female who presents with chief complaint of  Chief Complaint  Patient presents with   Follow-up    f/u in 3 months with abi  .  History of Present Illness: Patient returns today in follow up of her PAD.  About 3 years ago, we performed interventions for nonhealing ulcerations with PAD.  Her feet and legs are doing well.  They are still discolored and cool, but not really having any pain or ulceration.  Her ABIs today were 1.11 on the right and 1.19 left phasic waveforms and digital pressures of 104 on the right and 100 on the left.  Current Outpatient Medications  Medication Sig Dispense Refill   acetaminophen (TYLENOL) 500 MG tablet Take 500 mg by mouth every 6 (six) hours as needed for mild pain.     acyclovir (ZOVIRAX) 400 MG tablet Take 400 mg by mouth 2 (two) times daily.     atorvastatin (LIPITOR) 20 MG tablet Take 1 tablet (20 mg total) by mouth daily. 90 tablet 3   Docusate Calcium (STOOL SOFTENER PO) Take 1 capsule by mouth daily.     DULoxetine (CYMBALTA) 30 MG capsule Take 1 capsule (30 mg total) by mouth daily. For mood and neuropathy 30 capsule 6   ELIQUIS 5 MG TABS tablet TAKE 1 TABLET BY MOUTH TWICE DAILY 180 tablet 1   furosemide (LASIX) 20 MG tablet TAKE ONE TABLET BY MOUTH ONCE DAILY 90 tablet 1   hydrocortisone (ANUSOL-HC) 2.5 % rectal cream Place 1 application rectally daily as needed for hemorrhoids or anal itching. For hemmorhoid 30 g 0   lisinopril (ZESTRIL) 20 MG tablet Take 0.5 tablets (10 mg total) by mouth daily.     memantine (NAMENDA) 10 MG tablet Take 1 tablet (10 mg total) by mouth 2 (two) times daily. 180 tablet 3   metoprolol succinate (TOPROL-XL) 100 MG 24 hr tablet Take 1 tablet by mouth twice daily 180 tablet 0   mometasone (ELOCON) 0.1 % lotion 2-4 drops daily.     pantoprazole (PROTONIX) 40 MG tablet Take 1 tablet (40 mg total) by mouth daily. 90 tablet 3   polyethylene glycol (MIRALAX /  GLYCOLAX) 17 g packet Take 17 g by mouth daily as needed for mild constipation. 14 each 0   prednisoLONE acetate (PRED FORTE) 1 % ophthalmic suspension Place 1 drop into the left eye daily.      rOPINIRole (REQUIP) 1 MG tablet Take 2 mg by mouth at bedtime. Takes 2.5 tablets at bedtime     tizanidine (ZANAFLEX) 2 MG capsule Take 2 mg by mouth at bedtime.     traMADol (ULTRAM) 50 MG tablet Take 1-2 tablets (50-100 mg total) by mouth at bedtime as needed. 30 tablet 0   Vitamin D, Ergocalciferol, (DRISDOL) 1.25 MG (50000 UNIT) CAPS capsule Take 1 capsule (50,000 Units total) by mouth every 7 (seven) days. 12 capsule 3   No current facility-administered medications for this visit.    Past Medical History:  Diagnosis Date   Abdominal aortic atherosclerosis 09/2015   by xray   Anxiety    BCC (basal cell carcinoma of skin) 05/2011   on nose   BCC (basal cell carcinoma of skin) 08/09/2020   upper chest, [facility] 12/27/20   Breast lesion    a. diagnosed with complex sclerosing lesion with calcifications of the right breast in 08/2016   Chest pain    a. nuclear stress test  03/2012: normal; b. 09/2018 MV: EF >65%, no isch/scar; c. 07/2019 Cath: LM nl, LAD 40ost, 5653m, LCX nl, RCA 50ost/p (iFR 0.98). EF 55-65% ->Med Rx.   COVID-19 virus infection 10/12/2020   11/2020, 05/2022   DDD (degenerative disc disease), lumbosacral 12/06/1992   Gallstones 09/2015   incidentally by xray   GERD (gastroesophageal reflux disease) 05/1999   Hiatal hernia    History of shingles 2009   ophthalmic, takes acyclovir daily   Hyperlipidemia 12/2003   Hypertension 1990   Mitral regurgitation    a. echo 03/2012: EF 60-65%, no RWMA, mild to mod AI/MR, mod TR, PASP 37 mmHg; b. 05/2019 Echo: EF 60-65%, mod MR.   Osteoarthritis 08/21/1989   Osteoporosis with fracture 11/1997   with compression fracture T12   PAD (peripheral artery disease)    a. 01/2020 PTA of R Peroneal and R PT; b. 09/2021 ABI/TBI: R 1.04/09.2, L 1.36/0.17.    Permanent atrial fibrillation 03/16/2012   a. CHADS2VASc => 5 (HTN, age x 2, vascular disease, sex category)-->Eliquis 5 BID;  b. Recurrent AF in 06/2016;  c. 01/2017 s/p DCCV;  d. 02/2017 recurrent AFib->Amio added->DCCV; e. 03/2017 recurrent AFib, s/p reload of amio and DCCV; f. 12/2020 DCCV-->recurrent Afib, Amio d/c-->rate control.   POSTHERPETIC NEURALGIA 04/20/2007   Qualifier: Diagnosis of  By: Jillyn HiddenBean FNP, Mcarthur RossettiBillie-Lynn Daniels    Rheumatoid arthritis    RLS (restless legs syndrome)    SCC (squamous cell carcinoma) 05/01/2021   right posterior flank inferior to axilla - well differentiated squamous cell carcinoma with superificial infiltration, base involved. EDC 06/06/2021   Squamous cell carcinoma in situ 04/17/2022   Right dorsal hand, proximal. SCCis, inverted. Excised 05/08/2022   Squamous cell carcinoma of skin 12/19/2021   Proximal right dorsal hand. KA type. [facility]   Squamous cell carcinoma of skin 12/19/2021   Distal right dorsal hand. K[facility] type. [facility]    Past Surgical History:  Procedure Laterality Date   ABDOMINAL HYSTERECTOMY  Age 89 [birth year]- 1969   S/P TAH   abdominal ultrasound  04/23/2004   Gallstones   BREAST EXCISIONAL BIOPSY Right 2018   neg/benign complex sclerosing lesion   BREAST LUMPECTOMY WITH RADIOACTIVE SEED LOCALIZATION Right 10/2016   breast lumpectomy with radioactive seed localization and margin assessment for complex sclerosing lesion Mikey Bussing(Hay[place])   BREAST LUMPECTOMY WITH RADIOACTIVE SEED LOCALIZATION Right 11/05/2016   Procedure: RIGHT BREAST LUMPECTOMY WITH RADIOACTIVE SEED LOCALIZATION;  Surgeon: Claud KelpIngram, [place], MD;  Location: [facility];  Service: General;  Laterality: Right;   CARDIAC CATHETERIZATION     CARDIOVERSION N/A 01/31/2017   Procedure: CARDIOVERSION;  Surgeon: Iran OuchArida, Muhammad A, MD;  Location: [facility];  Service: Cardiovascular;  Laterality: N/A;   CARDIOVERSION N/A 03/03/2017   Procedure: CARDIOVERSION;  Surgeon: Iran OuchArida,  Muhammad A, MD;  Location: [facility];  Service: Cardiovascular;  Laterality: N/A;   CARDIOVERSION N/A 04/02/2017   Procedure: CARDIOVERSION;  Surgeon: Iran OuchArida, Muhammad A, MD;  Location: [facility];  Service: Cardiovascular;  Laterality: N/A;   CARDIOVERSION N/A 01/16/2018   Procedure: CARDIOVERSION;  Surgeon: Iran OuchArida, Muhammad A, MD;  Location: [facility];  Service: Cardiovascular;  Laterality: N/A;   CARDIOVERSION N/A 12/25/2020   Procedure: CARDIOVERSION;  Surgeon: Iran OuchArida, Muhammad A, MD;  Location: [facility];  Service: Cardiovascular;  Laterality: N/A;   CATARACT EXTRACTION  05/2010   left eye   CERVICAL DISCECTOMY  1992   Fusion (Dr. Jeral FruitBotero)   CORONARY PRESSURE/FFR STUDY N/A 07/12/2019   Procedure: INTRAVASCULAR PRESSURE WIRE/FFR STUDY;  Surgeon: Iran Ouch, MD;  Location: [facility];  Service: Cardiovascular;  Laterality: N/A;   ERCP N/A 10/30/2020   Procedure: ENDOSCOPIC RETROGRADE CHOLANGIOPANCREATOGRAPHY (ERCP);  Surgeon: Lynann Bologna, MD;  Location: [facility];  Service: Endoscopy;  Laterality: N/A;   ESI Bilateral 01/2016   S1 transforaminal ESI x3   ESOPHAGOGASTRODUODENOSCOPY  01/01/2002   with ulcer, bx. neg; + stricture gastric ulcer with hemorrhage   ESOPHAGOGASTRODUODENOSCOPY  04/23/2004   Esophageal stricture -- dilated   LOWER EXTREMITY ANGIOGRAPHY Left 02/01/2019   LOWER EXTREMITY ANGIOGRAPHY;  Surgeon: Annice Needy, MD   LOWER EXTREMITY ANGIOGRAPHY Right 03/25/2019   Procedure: LOWER EXTREMITY ANGIOGRAPHY;  Surgeon: Annice Needy, MD;  Location: [facility];  Service: Cardiovascular;  Laterality: Right;   LOWER EXTREMITY ANGIOGRAPHY Right 08/02/2019   Procedure: LOWER EXTREMITY ANGIOGRAPHY;  Surgeon: Annice Needy, MD;  Location: [facility];  Service: Cardiovascular;  Laterality: Right;   nuclear stress test  03/2012   no ischemia   PERIPHERAL VASCULAR BALLOON ANGIOPLASTY Left 01/2019   Percutaneous transluminal angioplasty of left anterior and  posterior tibial artery with 2.5 mm diameter by 22 cm length angioplasty balloon (Karder Goodin)   REMOVAL OF STONES  10/30/2020   Procedure: REMOVAL OF STONES;  Surgeon: Lynann Bologna, MD;  Location: [facility] ENDOSCOPY;  Service: Endoscopy;;   RIGHT/LEFT HEART CATH AND CORONARY ANGIOGRAPHY Bilateral 07/12/2019   Procedure: RIGHT/LEFT HEART CATH AND CORONARY ANGIOGRAPHY;  Surgeon: Iran Ouch, MD;  Location: [facility];  Service: Cardiovascular;  Laterality: Bilateral;   SKIN CANCER EXCISION  05/20/2011   BCC from nose   SPHINCTEROTOMY  10/30/2020   Procedure: SPHINCTEROTOMY;  Surgeon: Lynann Bologna, MD;  Location: [facility] END[facility]COPY;  Service: Endoscopy;;   US ECHOCARDIOGRAPHY  03/2012   in flutter, EF 60%, mild-mod aortic, mitral, tricuspid regurg, mildly dilated LA     Social History   Tobacco Use   Smoking status: Former    Packs/day: 0.25    Years: 1.00    Additional pack years: 0.00    Total pack years: 0.25    Types: Cigarettes    Quit date: 04/08/1976    Years since quitting: 46.3   Smokeless tobacco: Never   Tobacco comments:    pt smokes occasionally "socially"  Vaping Use   Vaping Use: Never used  Substance Use Topics   Alcohol use: No    Alcohol/week: 1.0 standard drink of alcohol    Types: 1 Glasses of wine per week   Drug use: No      Family History  Problem Relation Age of Onset   Emphysema Mother    Heart disease Father        MI   Stroke Father        first at age 76.   Stroke Sister    Diabetes Sister    Hypertension Brother    Heart disease Brother        Heart Valve   Stroke Brother    Diabetes Brother    Cancer Brother        esophageal   Diabetes Brother    Diabetes Brother    Colon cancer Neg Hx      Allergies  Allergen Reactions   Penicillins Rash and Other (See Comments)    Childhood Allergy Has patient had a PCN reaction causing immediate rash, facial/tongue/throat swelling, SOB or lightheadedness with hypotension: Yes Has patient had a  PCN reaction causing severe rash involving mucus  membranes or skin necrosis: Unknown Has patient had a PCN reaction that required hospitalization: No Has patient had a PCN reaction occurring within the last 10 years: No If all of the above answers are "NO", then may proceed with Cephalosporin use.      REVIEW OF SYSTEMS (Negative unless checked)   Constitutional: [] Weight loss  [] Fever  [] Chills Cardiac: [] Chest pain   [] Chest pressure   [x] Palpitations   [] Shortness of breath when laying flat   [] Shortness of breath at rest   [x] Shortness of breath with exertion. Vascular:  [x] Pain in legs with walking   [x] Pain in legs at rest   [] Pain in legs when laying flat   [] Claudication   [] Pain in feet when walking  [] Pain in feet at rest  [] Pain in feet when laying flat   [] History of DVT   [] Phlebitis   [x] Swelling in legs   [] Varicose veins   [] Non-healing ulcers Pulmonary:   [] Uses home oxygen   [] Productive cough   [] Hemoptysis   [] Wheeze  [] COPD   [] Asthma Neurologic:  [] Dizziness  [] Blackouts   [] Seizures   [] History of stroke   [] History of TIA  [] Aphasia   [] Temporary blindness   [] Dysphagia   [] Weakness or numbness in arms   [x] Weakness or numbness in legs Musculoskeletal:  [x] Arthritis   [] Joint swelling   [] Joint pain   [] Low back pain Hematologic:  [] Easy bruising  [] Easy bleeding   [] Hypercoagulable state   [] Anemic  [] Hepatitis Gastrointestinal:  [] Blood in stool   [] Vomiting blood  [] Gastroesophageal reflux/heartburn   [] Abdominal pain Genitourinary:  [] Chronic kidney disease   [] Difficult urination  [] Frequent urination  [] Burning with urination   [] Hematuria Skin:  [] Rashes   [] Ulcers   [] Wounds Psychological:  [] History of anxiety   []  History of major depression.  Physical Examination  BP 127/81 (BP Location: Right Arm)   Pulse 81   Resp 18   Ht 5\' 2"  (1.575 m)   Wt 138 lb (62.6 kg)   BMI 25.24 kg/m  Gen:  WD/WN, NAD.  Appears younger than stated age. Head: Anna/AT, No  temporalis wasting. Ear/Nose/Throat: Hearing grossly intact, nares w/o erythema or drainage Eyes: Conjunctiva clear. Sclera non-icteric Neck: Supple.  Trachea midline Pulmonary:  Good air movement, no use of accessory muscles.  Cardiac: irregular Vascular:  Vessel Right Left  Radial Palpable Palpable                          PT Palpable Palpable  DP Palpable Palpable   Gastrointestinal: soft, non-tender/non-distended. No guarding/reflex.  Musculoskeletal: M/S 5/5 throughout.  No deformity or atrophy. No edema. Neurologic: Sensation grossly intact in extremities.  Symmetrical.  Speech is fluent.  Psychiatric: Judgment intact, Mood & affect appropriate for pt's clinical situation. Dermatologic: No rashes or ulcers noted.  No cellulitis or open wounds.      Labs Recent Results (from the past 2160 hour(s))  ECHOCARDIOGRAM COMPLETE     Status: None   Collection Time: 04/18/22  2:41 PM  Result Value Ref Range   AR max vel 1.56 cm2   AV Peak grad 7.3 mmHg   Ao pk vel 1.36 m/s   S' Lateral 2.70 cm   AV Area VTI 1.35 cm2   AV Mean grad 4.0 mmHg   Single Plane A4C EF 53.5 %   Single Plane A2C EF 52.9 %   Calc EF 52.6 %   AV Area mean vel 1.42 cm2  AV Vena cont 0.60 cm   Est EF 50 - 55%   Magnesium     Status: None   Collection Time: 06/02/22 11:50 PM  Result Value Ref Range   Magnesium 1.9 1.7 - 2.4 mg/dL    Comment: Performed at [facility], [address] [place], [place] [address]  Resp panel by RT-PCR (RSV, Flu A&B, Covid) Anterior Nasal Swab     Status: Abnormal   Collection Time: 06/02/22 11:53 PM   Specimen: Anterior Nasal Swab  Result Value Ref Range   SARS Coronavirus 2 by RT PCR POSITIVE (A) NEGATIVE    Comment: (NOTE) SARS-CoV-2 target nucleic acids are DETECTED.  The SARS-CoV-2 RNA is generally detectable in upper respiratory specimens during the acute phase of infection. Positive results are indicative of the presence of the identified  virus, but do not rule out bacterial infection or co-infection with other pathogens not detected by the test. Clinical correlation with patient history and other diagnostic information is necessary to determine patient infection status. The expected result is Negative.  Fact Sheet for Patients: BloggerCourse.com  Fact Sheet for Healthcare Providers: SeriousBroker.it  This test is not yet approved or cleared by the [place] FDA and  has been authorized for detection and/or diagnosis of SARS-CoV-2 by FDA under an Emergency Use Authorization (EUA).  This EUA will remain in effect (meaning this test can be used) for the duration of  the COVID-19 declaration under Section 564(b)(1) of the A ct, 21 U.S.C. section 360bbb-3(b)(1), unless the authorization is terminated or revoked sooner.     Influenza A by PCR NEGATIVE NEGATIVE   Influenza B by PCR NEGATIVE NEGATIVE    Comment: (NOTE) The Xpert Xpress SARS-CoV-2/FLU/RSV plus assay is intended as an aid in the diagnosis of influenza from Nasopharyngeal swab specimens and should not be used as a sole basis for treatment. Nasal washings and aspirates are unacceptable for Xpert Xpress SARS-CoV-2/FLU/RSV testing.  Fact Sheet for Patients: BloggerCourse.com  Fact Sheet for Healthcare Providers: SeriousBroker.it  This test is not yet approved or cleared by the [place] FDA and has been authorized for detection and/or diagnosis of SARS-CoV-2 by FDA under an Emergency Use Authorization (EUA). This EUA will remain in effect (meaning this test can be used) for the duration of the COVID-19 declaration under Section 564(b)(1) of the Act, 21 U.S.C. section 360bbb-3(b)(1), unless the authorization is terminated or revoked.     Resp Syncytial Virus by PCR NEGATIVE NEGATIVE    Comment: (NOTE) Fact Sheet for  Patients: BloggerCourse.com  Fact Sheet for Healthcare Providers: SeriousBroker.it  This test is not yet approved or cleared by the [place] FDA and has been authorized for detection and/or diagnosis of SARS-CoV-2 by FDA under an Emergency Use Authorization (EUA). This EUA will remain in effect (meaning this test can be used) for the duration of the COVID-19 declaration under Section 564(b)(1) of the Act, 21 U.S.C. section 360bbb-3(b)(1), unless the authorization is terminated or revoked.  Performed at [facility], [address] [place], [place] [address]   Lactic acid, plasma     Status: None   Collection Time: 06/02/22 11:53 PM  Result Value Ref Range   Lactic Acid, Venous 1.5 0.5 - 1.9 mmol/L    Comment: Performed at [facility], [address] [place], [place] [address]  Comprehensive metabolic panel     Status: Abnormal   Collection Time: 06/02/22 11:53 PM  Result Value Ref Range   Sodium 139 135 -  145 mmol/L   Potassium 2.5 (LL) 3.5 - 5.1 mmol/L    Comment: CRITICAL RESULT CALLED TO, READ BACK BY AND VERIFIED WITH TIFFANY BROWN AT 0032 06/03/2022 DLB    Chloride 96 (L) 98 - 111 mmol/L   CO2 29 22 - 32 mmol/L   Glucose, Bld 114 (H) 70 - 99 mg/dL    Comment: Glucose reference range applies only to samples taken after fasting for at least 8 hours.   BUN 11 8 - 23 mg/dL   Creatinine, Ser 1.61 0.44 - 1.00 mg/dL   Calcium 9.2 8.9 - 09.6 mg/dL   Total Protein 6.9 6.5 - 8.1 g/dL   Albumin 3.6 3.5 - 5.0 g/dL   AST 19 15 - 41 U/L   ALT 14 0 - 44 U/L   Alkaline Phosphatase 63 38 - 126 U/L   Total Bilirubin 1.0 0.3 - 1.2 mg/dL   GFR, Estimated 55 (L) >60 mL/min    Comment: (NOTE) Calculated using the CKD-EPI Creatinine Equation (2021)    Anion gap 14 5 - 15    Comment: Performed at [facility], [address] [place], [place] [address]  CBC with Differential     Status: None    Collection Time: 06/02/22 11:53 PM  Result Value Ref Range   WBC 9.6 4.0 - 10.5 K/uL   RBC 4.28 3.87 - 5.11 MIL/uL   Hemoglobin 13.7 12.0 - 15.0 g/dL   HCT 98.1 19.1 - 47.8 %   MCV 95.3 80.0 - 100.0 fL   MCH 32.0 26.0 - 34.0 pg   MCHC 33.6 30.0 - 36.0 g/dL   RDW 29.5 62.1 - 30.8 %   Platelets 240 150 - 400 K/uL   nRBC 0.0 0.0 - 0.2 %   Neutrophils Relative % 73 %   Neutro Abs 7.0 1.7 - 7.7 K/uL   Lymphocytes Relative 16 %   Lymphs Abs 1.6 0.7 - 4.0 K/uL   Monocytes Relative 9 %   Monocytes Absolute 0.9 0.1 - 1.0 K/uL   Eosinophils Relative 1 %   Eosinophils Absolute 0.1 0.0 - 0.5 K/uL   Basophils Relative 1 %   Basophils Absolute 0.1 0.0 - 0.1 K/uL   Immature Granulocytes 0 %   Abs Immature Granulocytes 0.02 0.00 - 0.07 K/uL    Comment: Performed at [facility], [address] [place], [place] [address]  Protime-INR     Status: Abnormal   Collection Time: 06/02/22 11:53 PM  Result Value Ref Range   Prothrombin Time 19.5 (H) 11.4 - 15.2 seconds   INR 1.7 (H) 0.8 - 1.2    Comment: (NOTE) INR goal varies based on device and disease states. Performed at [facility], [address] [place], [place] [address]   APTT     Status: None   Collection Time: 06/02/22 11:53 PM  Result Value Ref Range   aPTT 36 24 - 36 seconds    Comment: Performed at [facility], [address] [place], [place] [address]  Blood Culture (routine x 2)     Status: None   Collection Time: 06/02/22 11:53 PM   Specimen: BLOOD  Result Value Ref Range   Specimen Description BLOOD BLOOD RIGHT ARM rIGHT ANTECUBITAL    Special Requests      BOTTLES DRAWN AEROBIC AND ANAEROBIC Blood Culture results may not be optimal due to an excessive volume of blood received in culture bottles   Culture      NO GROWTH  5 DAYS Performed at [facility], [address] [place], [place] [address]    Report Status 06/08/2022 FINAL   Blood Culture (routine x 2)     Status: None    Collection Time: 06/02/22 11:53 PM   Specimen: BLOOD  Result Value Ref Range   Specimen Description BLOOD BLOOD LEFT ARM LEFT FOREARM    Special Requests      BOTTLES DRAWN AEROBIC AND ANAEROBIC Blood Culture adequate volume   Culture      NO GROWTH 5 DAYS Performed at [facility], [address] [place], [place] [address]    Report Status 06/08/2022 FINAL   Urinalysis, Complete w Microscopic -Urine, Clean Catch     Status: Abnormal   Collection Time: 06/02/22 11:53 PM  Result Value Ref Range   Color, Urine YELLOW (A) YELLOW   APPearance CLEAR (A) CLEAR   Specific Gravity, Urine 1.015 1.005 - 1.030   pH 6.0 5.0 - 8.0   Glucose, UA NEGATIVE NEGATIVE mg/dL   Hgb urine dipstick NEGATIVE NEGATIVE   Bilirubin Urine NEGATIVE NEGATIVE   Ketones, ur NEGATIVE NEGATIVE mg/dL   Protein, ur NEGATIVE NEGATIVE mg/dL   Nitrite NEGATIVE NEGATIVE   Leukocytes,Ua NEGATIVE NEGATIVE   RBC / HPF 0-5 0 - 5 RBC/hpf   WBC, UA 0-5 0 - 5 WBC/hpf   Bacteria, UA NONE SEEN NONE SEEN   Squamous Epithelial / HPF 0-5 0 - 5 /HPF   Mucus PRESENT     Comment: Performed at [facility], [address], [place], [place] [address]  Urine Culture (for pregnant, neutropenic or urologic patients or patients with an indwelling urinary catheter)     Status: None   Collection Time: 06/02/22 11:53 PM   Specimen: Urine, Clean Catch  Result Value Ref Range   Specimen Description      URINE, CLEAN CATCH Performed at [facility], [address] [place], [place] [address]    Special Requests      NONE Performed at [facility], [address] [place], [place] [address]    Culture      NO GROWTH Performed at [facility], [address], [place], [place] [address]    Report Status 06/04/2022 FINAL   Procalcitonin     Status: None   Collection Time: 06/02/22 11:53 PM  Result Value Ref Range   Procalcitonin <0.10 ng/mL    Comment:        Interpretation: PCT  (Procalcitonin) <= 0.5 ng/mL: Systemic infection (sepsis) is not likely. Local bacterial infection is possible. (NOTE)       Sepsis PCT Algorithm           Lower Respiratory Tract                                      Infection PCT Algorithm    ----------------------------     ----------------------------         PCT < 0.25 ng/mL                PCT < 0.10 ng/mL          Strongly encourage             Strongly discourage   discontinuation of antibiotics    initiation of antibiotics    ----------------------------     -----------------------------       PCT 0.25 -  0.50 ng/mL            PCT 0.10 - 0.25 ng/mL               OR       >80% decrease in PCT            Discourage initiation of                                            antibiotics      Encourage discontinuation           of antibiotics    ----------------------------     -----------------------------         PCT >= 0.50 ng/mL              PCT 0.26 - 0.50 ng/mL               AND        <80% decrease in PCT             Encourage initiation of                                             antibiotics       Encourage continuation           of antibiotics    ----------------------------     -----------------------------        PCT >= 0.50 ng/mL                  PCT > 0.50 ng/mL               AND         increase in PCT                  Strongly encourage                                      initiation of antibiotics    Strongly encourage escalation           of antibiotics                                     -----------------------------                                           PCT <= 0.25 ng/mL                                                 OR                                        > 80% decrease in PCT  Discontinue / Do not initiate                                             antibiotics  Performed at [facility], [address] [place], [place] [address]   Troponin I (High  Sensitivity)     Status: None   Collection Time: 06/02/22 11:53 PM  Result Value Ref Range   Troponin I (High Sensitivity) 12 <18 ng/L    Comment: (NOTE) Elevated high sensitivity troponin I (hsTnI) values and significant  changes across serial measurements may suggest ACS but many other  chronic and acute conditions are known to elevate hsTnI results.  Refer to the "Links" section for chest pain algorithms and additional  guidance. Performed at [facility], [address] [place], [place] [address]   Lactic acid, plasma     Status: None   Collection Time: 06/03/22  2:53 AM  Result Value Ref Range   Lactic Acid, Venous 1.5 0.5 - 1.9 mmol/L    Comment: Performed at [facility], [address] [place], [place] [address]  Troponin I (High Sensitivity)     Status: None   Collection Time: 06/03/22  2:53 AM  Result Value Ref Range   Troponin I (High Sensitivity) 14 <18 ng/L    Comment: (NOTE) Elevated high sensitivity troponin I (hsTnI) values and significant  changes across serial measurements may suggest ACS but many other  chronic and acute conditions are known to elevate hsTnI results.  Refer to the "Links" section for chest pain algorithms and additional  guidance. Performed at [facility], [address] [place], [place] [address]   Ferritin     Status: None   Collection Time: 06/03/22  2:53 AM  Result Value Ref Range   Ferritin 48 11 - 307 ng/mL    Comment: Performed at [facility], [address] [place], [place] [address]  Lactate dehydrogenase     Status: None   Collection Time: 06/03/22  2:53 AM  Result Value Ref Range   LDH 137 98 - 192 U/L    Comment: Performed at [facility], [address] [place], [place] [address]  C-reactive protein     Status: Abnormal   Collection Time: 06/03/22  3:16 AM  Result Value Ref Range   CRP 1.9 (H) <1.0 mg/dL    Comment: Performed at [facility], [address], [place], [place] [address]  Troponin I (High Sensitivity)     Status: None   Collection Time: 06/03/22  5:25 AM  Result Value Ref Range   Troponin I (High Sensitivity) 12 <18 ng/L    Comment: (NOTE) Elevated high sensitivity troponin I (hsTnI) values and significant  changes across serial measurements may suggest ACS but many other  chronic and acute conditions are known to elevate hsTnI results.  Refer to the "Links" section for chest pain algorithms and additional  guidance. Performed at [facility], [address] [place], [place] [address]   CBC with Differential/Platelet     Status: None   Collection Time: 06/03/22  8:18 AM  Result Value Ref Range   WBC 7.4 4.0 - 10.5 K/uL   RBC 4.04 3.87 - 5.11 MIL/uL   Hemoglobin 12.9 12.0 - 15.0 g/dL   HCT 28.4 13.2 - 44.0 %   MCV 96.5 80.0 -  100.0 fL   MCH 31.9 26.0 - 34.0 pg   MCHC 33.1 30.0 - 36.0 g/dL   RDW 16.1 09.6 - 04.5 %   Platelets 203 150 - 400 K/uL   nRBC 0.0 0.0 - 0.2 %   Neutrophils Relative % 82 %   Neutro Abs 6.1 1.7 - 7.7 K/uL   Lymphocytes Relative 14 %   Lymphs Abs 1.1 0.7 - 4.0 K/uL   Monocytes Relative 3 %   Monocytes Absolute 0.2 0.1 - 1.0 K/uL   Eosinophils Relative 0 %   Eosinophils Absolute 0.0 0.0 - 0.5 K/uL   Basophils Relative 1 %   Basophils Absolute 0.0 0.0 - 0.1 K/uL   Immature Granulocytes 0 %   Abs Immature Granulocytes 0.03 0.00 - 0.07 K/uL    Comment: Performed at [facility], [address] [place], [place] [address]  Comprehensive metabolic panel     Status: Abnormal   Collection Time: 06/03/22  8:18 AM  Result Value Ref Range   Sodium 139 135 - 145 mmol/L   Potassium 4.3 3.5 - 5.1 mmol/L   Chloride 103 98 - 111 mmol/L   CO2 28 22 - 32 mmol/L   Glucose, Bld 127 (H) 70 - 99 mg/dL    Comment: Glucose reference range applies only to samples taken after fasting for at least 8 hours.   BUN 11 8 - 23 mg/dL   Creatinine, Ser 1.91 0.44 - 1.00 mg/dL   Calcium 8.8 (L) 8.9 -  10.3 mg/dL   Total Protein 6.5 6.5 - 8.1 g/dL   Albumin 3.2 (L) 3.5 - 5.0 g/dL   AST 25 15 - 41 U/L   ALT 11 0 - 44 U/L   Alkaline Phosphatase 60 38 - 126 U/L   Total Bilirubin 1.2 0.3 - 1.2 mg/dL   GFR, Estimated >47 >82 mL/min    Comment: (NOTE) Calculated using the CKD-EPI Creatinine Equation (2021)    Anion gap 8 5 - 15    Comment: Performed at [facility], [address], [place], [place] [address]  Magnesium     Status: None   Collection Time: 06/03/22  8:18 AM  Result Value Ref Range   Magnesium 1.9 1.7 - 2.4 mg/dL    Comment: Performed at [facility], [address], [place], [place] [address]  Phosphorus     Status: None   Collection Time: 06/03/22  8:18 AM  Result Value Ref Range   Phosphorus 4.2 2.5 - 4.6 mg/dL    Comment: Performed at [facility], [address] [place], [place] [address]  Glucose, capillary     Status: Abnormal   Collection Time: 06/04/22 12:44 AM  Result Value Ref Range   Glucose-Capillary 266 (H) 70 - 99 mg/dL    Comment: Glucose reference range applies only to samples taken after fasting for at least 8 hours.  D-dimer, quantitative     Status: Abnormal   Collection Time: 06/04/22  1:33 AM  Result Value Ref Range   D-Dimer, Quant 1.52 (H) 0.00 - 0.50 ug/mL-FEU    Comment: (NOTE) At the manufacturer cut-off value of 0.5 g/mL FEU, this assay has a negative predictive value of 95-100%.This assay is intended for use in conjunction with a clinical pretest probability (PTP) assessment model to exclude pulmonary embolism (PE) and deep venous thrombosis (DVT) in outpatients suspected of PE or DVT. Results should be correlated with clinical presentation. Performed at [facility], [address] [place],  [place] [address]   Blood gas, venous     Status: Abnormal   Collection Time: 06/04/22  1:33 AM  Result Value Ref Range   pH, Ven 7.08 (LL) 7.25 - 7.43    Comment: CRITICAL RESULT CALLED TO, READ BACK  BY AND VERIFIED WITH: KATY FOUST NP AT 0145 ON [id number] HP    pCO2, Ven 52 44 - 60 mmHg   pO2, Ven 63 (H) 32 - 45 mmHg   Bicarbonate 15.4 (L) 20.0 - 28.0 mmol/L   Acid-base deficit 14.6 (H) 0.0 - 2.0 mmol/L   O2 Saturation 86.5 %   Patient temperature 37.0    Collection site VEIN     Comment: Performed at [facility], [address] [place], [place] [address]  Procalcitonin     Status: None   Collection Time: 06/04/22  1:33 AM  Result Value Ref Range   Procalcitonin <0.10 ng/mL    Comment:        Interpretation: PCT (Procalcitonin) <= 0.5 ng/mL: Systemic infection (sepsis) is not likely. Local bacterial infection is possible. (NOTE)       Sepsis PCT Algorithm           Lower Respiratory Tract                                      Infection PCT Algorithm    ----------------------------     ----------------------------         PCT < 0.25 ng/mL                PCT < 0.10 ng/mL          Strongly encourage             Strongly discourage   discontinuation of antibiotics    initiation of antibiotics    ----------------------------     -----------------------------       PCT 0.25 - 0.50 ng/mL            PCT 0.10 - 0.25 ng/mL               OR       >80% decrease in PCT            Discourage initiation of                                            antibiotics      Encourage discontinuation           of antibiotics    ----------------------------     -----------------------------         PCT >= 0.50 ng/mL              PCT 0.26 - 0.50 ng/mL               AND        <80% decrease in PCT             Encourage initiation of                                             antibiotics       Encourage continuation  of antibiotics    ----------------------------     -----------------------------        PCT >= 0.50 ng/mL                  PCT > 0.50 ng/mL               AND         increase in PCT                  Strongly encourage                                       initiation of antibiotics    Strongly encourage escalation           of antibiotics                                     -----------------------------                                           PCT <= 0.25 ng/mL                                                 OR                                        > 80% decrease in PCT                                      Discontinue / Do not initiate                                             antibiotics  Performed at [facility], [address] [place], [place] [address]   Hemoglobin A1c     Status: Abnormal   Collection Time: 06/04/22  1:33 AM  Result Value Ref Range   Hgb A1c MFr Bld 6.5 (H) 4.8 - 5.6 %    Comment: (NOTE)         Prediabetes: 5.7 - 6.4         Diabetes: >6.4         Glycemic control for adults with diabetes: <7.0    Mean Plasma Glucose 140 mg/dL    Comment: (NOTE) Performed At: [facility] [address] [place], [place] [id number] Jolene Schimke MD [id number]   Brain natriuretic peptide     Status: Abnormal   Collection Time: 06/04/22  6:37 AM  Result Value Ref Range   B Natriuretic Peptide 710.6 (H) 0.0 - 100.0 pg/mL    Comment: Performed at [facility], [address] [place], [place] [address]  C-reactive protein     Status: Abnormal   Collection Time: 06/04/22  6:52 AM  Result Value Ref  Range   CRP 2.0 (H) <1.0 mg/dL    Comment: Performed at [facility], [address], [place], [place] [address]  D-dimer, quantitative     Status: Abnormal   Collection Time: 06/04/22  6:52 AM  Result Value Ref Range   D-Dimer, Quant 1.63 (H) 0.00 - 0.50 ug/mL-FEU    Comment: (NOTE) At the manufacturer cut-off value of 0.5 g/mL FEU, this assay has a negative predictive value of 95-100%.This assay is intended for use in conjunction with a clinical pretest probability (PTP) assessment model to exclude pulmonary embolism (PE) and deep venous thrombosis (DVT) in outpatients suspected of  PE or DVT. Results should be correlated with clinical presentation. Performed at [facility], [address] [place], [place] [address]   Ferritin     Status: None   Collection Time: 06/04/22  6:52 AM  Result Value Ref Range   Ferritin 98 11 - 307 ng/mL    Comment: Performed at [facility], [address] [place], [place] [address]  CBC with Differential/Platelet     Status: Abnormal   Collection Time: 06/04/22  6:52 AM  Result Value Ref Range   WBC 15.3 (H) 4.0 - 10.5 K/uL   RBC 4.15 3.87 - 5.11 MIL/uL   Hemoglobin 13.1 12.0 - 15.0 g/dL   HCT 46.9 62.9 - 52.8 %   MCV 95.2 80.0 - 100.0 fL   MCH 31.6 26.0 - 34.0 pg   MCHC 33.2 30.0 - 36.0 g/dL   RDW 41.3 24.4 - 01.0 %   Platelets 222 150 - 400 K/uL   nRBC 0.0 0.0 - 0.2 %   Neutrophils Relative % 89 %   Neutro Abs 13.7 (H) 1.7 - 7.7 K/uL   Lymphocytes Relative 5 %   Lymphs Abs 0.8 0.7 - 4.0 K/uL   Monocytes Relative 4 %   Monocytes Absolute 0.6 0.1 - 1.0 K/uL   Eosinophils Relative 1 %   Eosinophils Absolute 0.1 0.0 - 0.5 K/uL   Basophils Relative 0 %   Basophils Absolute 0.0 0.0 - 0.1 K/uL   Immature Granulocytes 1 %   Abs Immature Granulocytes 0.09 (H) 0.00 - 0.07 K/uL    Comment: Performed at [facility], [address] [place], [place] [address]  Comprehensive metabolic panel     Status: Abnormal   Collection Time: 06/04/22  6:52 AM  Result Value Ref Range   Sodium 143 135 - 145 mmol/L   Potassium 3.4 (L) 3.5 - 5.1 mmol/L   Chloride 104 98 - 111 mmol/L   CO2 27 22 - 32 mmol/L   Glucose, Bld 182 (H) 70 - 99 mg/dL    Comment: Glucose reference range applies only to samples taken after fasting for at least 8 hours.   BUN 16 8 - 23 mg/dL   Creatinine, Ser 6.64 0.44 - 1.00 mg/dL   Calcium 8.7 (L) 8.9 - 10.3 mg/dL   Total Protein 6.2 (L) 6.5 - 8.1 g/dL   Albumin 3.1 (L) 3.5 - 5.0 g/dL   AST 46 (H) 15 - 41 U/L   ALT 31 0 - 44 U/L   Alkaline Phosphatase 69 38 - 126 U/L   Total  Bilirubin 0.6 0.3 - 1.2 mg/dL   GFR, Estimated 57 (L) >60 mL/min    Comment: (NOTE) Calculated using the CKD-EPI Creatinine Equation (2021)    Anion gap 12 5 - 15    Comment: Performed at [facility], [address]  [place], [place] [address]  Magnesium     Status: None   Collection Time: 06/04/22  6:52 AM  Result Value Ref Range   Magnesium 1.8 1.7 - 2.4 mg/dL    Comment: Performed at [facility], [address] [place], [place] [address]  Blood gas, venous     Status: Abnormal   Collection Time: 06/04/22  6:52 AM  Result Value Ref Range   pH, Ven 7.44 (H) 7.25 - 7.43   pCO2, Ven 44 44 - 60 mmHg   pO2, Ven 61 (H) 32 - 45 mmHg   Bicarbonate 29.9 (H) 20.0 - 28.0 mmol/L   Acid-Base Excess 5.0 (H) 0.0 - 2.0 mmol/L   O2 Saturation 92.5 %   Patient temperature 37.0    Collection site VENOUS     Comment: Performed at [facility], [address] [place], [place] [address]  Lactic acid, plasma     Status: Abnormal   Collection Time: 06/04/22  6:52 AM  Result Value Ref Range   Lactic Acid, Venous 2.7 (HH) 0.5 - 1.9 mmol/L    Comment: CRITICAL RESULT CALLED TO, READ BACK BY AND VERIFIED WITH JESSICA [email or web address]  ON 06/04/22 BY HKP Performed at [facility], [address] [place], [place] [address]   Glucose, capillary     Status: Abnormal   Collection Time: 06/04/22 12:40 PM  Result Value Ref Range   Glucose-Capillary 217 (H) 70 - 99 mg/dL    Comment: Glucose reference range applies only to samples taken after fasting for at least 8 hours.  Glucose, capillary     Status: Abnormal   Collection Time: 06/04/22  4:56 PM  Result Value Ref Range   Glucose-Capillary 174 (H) 70 - 99 mg/dL    Comment: Glucose reference range applies only to samples taken after fasting for at least 8 hours.  Glucose, capillary     Status: Abnormal   Collection Time: 06/04/22  9:05 PM  Result Value Ref Range   Glucose-Capillary 149 (H) 70 - 99 mg/dL    Comment:  Glucose reference range applies only to samples taken after fasting for at least 8 hours.  C-reactive protein     Status: Abnormal   Collection Time: 06/05/22  3:10 AM  Result Value Ref Range   CRP 5.2 (H) <1.0 mg/dL    Comment: Performed at [facility], [address] [place], [place] [address]  D-dimer, quantitative     Status: Abnormal   Collection Time: 06/05/22  3:10 AM  Result Value Ref Range   D-Dimer, Quant 0.71 (H) 0.00 - 0.50 ug/mL-FEU    Comment: (NOTE) At the manufacturer cut-off value of 0.5 g/mL FEU, this assay has a negative predictive value of 95-100%.This assay is intended for use in conjunction with a clinical pretest probability (PTP) assessment model to exclude pulmonary embolism (PE) and deep venous thrombosis (DVT) in outpatients suspected of PE or DVT. Results should be correlated with clinical presentation. Performed at [facility], [address] [place], [place] [address]   Ferritin     Status: None   Collection Time: 06/05/22  3:10 AM  Result Value Ref Range   Ferritin 104 11 - 307 ng/mL    Comment: Performed at [facility], [address] [place], [place] [address]  CBC with Differential/Platelet     Status: Abnormal   Collection Time: 06/05/22  3:10 AM  Result Value Ref Range   WBC 16.5 (H) 4.0 - 10.5 K/uL  RBC 3.96 3.87 - 5.11 MIL/uL   Hemoglobin 12.7 12.0 - 15.0 g/dL   HCT 16.1 09.6 - 04.5 %   MCV 95.5 80.0 - 100.0 fL   MCH 32.1 26.0 - 34.0 pg   MCHC 33.6 30.0 - 36.0 g/dL   RDW 40.9 81.1 - 91.4 %   Platelets 236 150 - 400 K/uL   nRBC 0.0 0.0 - 0.2 %   Neutrophils Relative % 91 %   Neutro Abs 15.0 (H) 1.7 - 7.7 K/uL   Lymphocytes Relative 7 %   Lymphs Abs 1.1 0.7 - 4.0 K/uL   Monocytes Relative 1 %   Monocytes Absolute 0.2 0.1 - 1.0 K/uL   Eosinophils Relative 0 %   Eosinophils Absolute 0.0 0.0 - 0.5 K/uL   Basophils Relative 0 %   Basophils Absolute 0.0 0.0 - 0.1 K/uL   Immature Granulocytes 1 %   Abs  Immature Granulocytes 0.10 (H) 0.00 - 0.07 K/uL    Comment: Performed at [facility], [address] [place], [place] [address]  Comprehensive metabolic panel     Status: Abnormal   Collection Time: 06/05/22  3:10 AM  Result Value Ref Range   Sodium 143 135 - 145 mmol/L   Potassium 3.3 (L) 3.5 - 5.1 mmol/L   Chloride 100 98 - 111 mmol/L   CO2 29 22 - 32 mmol/L   Glucose, Bld 155 (H) 70 - 99 mg/dL    Comment: Glucose reference range applies only to samples taken after fasting for at least 8 hours.   BUN 20 8 - 23 mg/dL   Creatinine, Ser 6.21 0.44 - 1.00 mg/dL   Calcium 9.1 8.9 - 30.8 mg/dL   Total Protein 6.8 6.5 - 8.1 g/dL   Albumin 3.4 (L) 3.5 - 5.0 g/dL   AST 38 15 - 41 U/L   ALT 29 0 - 44 U/L   Alkaline Phosphatase 65 38 - 126 U/L   Total Bilirubin 0.7 0.3 - 1.2 mg/dL   GFR, Estimated >65 >78 mL/min    Comment: (NOTE) Calculated using the CKD-EPI Creatinine Equation (2021)    Anion gap 14 5 - 15    Comment: Performed at [facility], [address], [place], [place] [address]  Magnesium     Status: None   Collection Time: 06/05/22  3:10 AM  Result Value Ref Range   Magnesium 2.0 1.7 - 2.4 mg/dL    Comment: Performed at [facility], [address] [place], [place] [address]  Glucose, capillary     Status: Abnormal   Collection Time: 06/05/22  8:34 AM  Result Value Ref Range   Glucose-Capillary 163 (H) 70 - 99 mg/dL    Comment: Glucose reference range applies only to samples taken after fasting for at least 8 hours.  Glucose, capillary     Status: Abnormal   Collection Time: 06/05/22 12:05 PM  Result Value Ref Range   Glucose-Capillary 227 (H) 70 - 99 mg/dL    Comment: Glucose reference range applies only to samples taken after fasting for at least 8 hours.  Troponin I (High Sensitivity)     Status: Abnormal   Collection Time: 06/05/22  1:28 PM  Result Value Ref Range   Troponin I (High Sensitivity) 97 (H) <18 ng/L    Comment: READ BACK  AND VERIFIED WITH LASHARA SINGLETARY AT 1602 ON 06/05/22 BY SS (NOTE) Elevated high sensitivity troponin I (hsTnI) values and significant  changes across serial measurements may suggest  ACS but many other  chronic and acute conditions are known to elevate hsTnI results.  Refer to the "Links" section for chest pain algorithms and additional  guidance. Performed at [facility], [address] [place], [place] [address]   Glucose, capillary     Status: Abnormal   Collection Time: 06/05/22  4:27 PM  Result Value Ref Range   Glucose-Capillary 100 (H) 70 - 99 mg/dL    Comment: Glucose reference range applies only to samples taken after fasting for at least 8 hours.  Troponin I (High Sensitivity)     Status: Abnormal   Collection Time: 06/05/22  5:35 PM  Result Value Ref Range   Troponin I (High Sensitivity) 86 (H) <18 ng/L    Comment: (NOTE) Elevated high sensitivity troponin I (hsTnI) values and significant  changes across serial measurements may suggest ACS but many other  chronic and acute conditions are known to elevate hsTnI results.  Refer to the "Links" section for chest pain algorithms and additional  guidance. Performed at [facility], [address] [place], [place] [address]   Glucose, capillary     Status: Abnormal   Collection Time: 06/05/22  8:53 PM  Result Value Ref Range   Glucose-Capillary 240 (H) 70 - 99 mg/dL    Comment: Glucose reference range applies only to samples taken after fasting for at least 8 hours.   Comment 1 Notify RN   C-reactive protein     Status: Abnormal   Collection Time: 06/06/22  6:59 AM  Result Value Ref Range   CRP 2.4 (H) <1.0 mg/dL    Comment: Performed at [facility], [address], [place], [place] [address]  D-dimer, quantitative     Status: Abnormal   Collection Time: 06/06/22  6:59 AM  Result Value Ref Range   D-Dimer, Quant 0.52 (H) 0.00 - 0.50 ug/mL-FEU    Comment: (NOTE) At the manufacturer  cut-off value of 0.5 g/mL FEU, this assay has a negative predictive value of 95-100%.This assay is intended for use in conjunction with a clinical pretest probability (PTP) assessment model to exclude pulmonary embolism (PE) and deep venous thrombosis (DVT) in outpatients suspected of PE or DVT. Results should be correlated with clinical presentation. Performed at [facility], [address] [place], [place] [address]   CBC with Differential/Platelet     Status: Abnormal   Collection Time: 06/06/22  6:59 AM  Result Value Ref Range   WBC 12.3 (H) 4.0 - 10.5 K/uL   RBC 3.70 (L) 3.87 - 5.11 MIL/uL   Hemoglobin 11.7 (L) 12.0 - 15.0 g/dL   HCT 29.5 (L) 62.1 - 30.8 %   MCV 95.9 80.0 - 100.0 fL   MCH 31.6 26.0 - 34.0 pg   MCHC 33.0 30.0 - 36.0 g/dL   RDW 65.7 84.6 - 96.2 %   Platelets 216 150 - 400 K/uL   nRBC 0.0 0.0 - 0.2 %   Neutrophils Relative % 89 %   Neutro Abs 11.0 (H) 1.7 - 7.7 K/uL   Lymphocytes Relative 7 %   Lymphs Abs 0.8 0.7 - 4.0 K/uL   Monocytes Relative 3 %   Monocytes Absolute 0.3 0.1 - 1.0 K/uL   Eosinophils Relative 0 %   Eosinophils Absolute 0.0 0.0 - 0.5 K/uL   Basophils Relative 0 %   Basophils Absolute 0.0 0.0 - 0.1 K/uL   Immature Granulocytes 1 %   Abs Immature Granulocytes 0.08 (H) 0.00 - 0.07  K/uL    Comment: Performed at [facility], [address] [place], [place] [address]  Comprehensive metabolic panel     Status: Abnormal   Collection Time: 06/06/22  6:59 AM  Result Value Ref Range   Sodium 143 135 - 145 mmol/L   Potassium 3.3 (L) 3.5 - 5.1 mmol/L   Chloride 98 98 - 111 mmol/L   CO2 33 (H) 22 - 32 mmol/L   Glucose, Bld 146 (H) 70 - 99 mg/dL    Comment: Glucose reference range applies only to samples taken after fasting for at least 8 hours.   BUN 29 (H) 8 - 23 mg/dL   Creatinine, Ser 6.04 0.44 - 1.00 mg/dL   Calcium 9.2 8.9 - 54.0 mg/dL   Total Protein 6.4 (L) 6.5 - 8.1 g/dL   Albumin 3.2 (L) 3.5 - 5.0 g/dL   AST 27  15 - 41 U/L   ALT 26 0 - 44 U/L   Alkaline Phosphatase 61 38 - 126 U/L   Total Bilirubin 0.8 0.3 - 1.2 mg/dL   GFR, Estimated >98 >11 mL/min    Comment: (NOTE) Calculated using the CKD-EPI Creatinine Equation (2021)    Anion gap 12 5 - 15    Comment: Performed at [facility], [address] [place], [place] [address]  Glucose, capillary     Status: Abnormal   Collection Time: 06/06/22  8:02 AM  Result Value Ref Range   Glucose-Capillary 151 (H) 70 - 99 mg/dL    Comment: Glucose reference range applies only to samples taken after fasting for at least 8 hours.  Glucose, capillary     Status: Abnormal   Collection Time: 06/06/22 11:58 AM  Result Value Ref Range   Glucose-Capillary 210 (H) 70 - 99 mg/dL    Comment: Glucose reference range applies only to samples taken after fasting for at least 8 hours.  Glucose, capillary     Status: Abnormal   Collection Time: 06/06/22  4:34 PM  Result Value Ref Range   Glucose-Capillary 150 (H) 70 - 99 mg/dL    Comment: Glucose reference range applies only to samples taken after fasting for at least 8 hours.  Glucose, capillary     Status: Abnormal   Collection Time: 06/06/22  8:43 PM  Result Value Ref Range   Glucose-Capillary 159 (H) 70 - 99 mg/dL    Comment: Glucose reference range applies only to samples taken after fasting for at least 8 hours.   Comment 1 Notify RN   Basic metabolic panel     Status: Abnormal   Collection Time: 06/07/22  5:51 AM  Result Value Ref Range   Sodium 143 135 - 145 mmol/L   Potassium 4.2 3.5 - 5.1 mmol/L   Chloride 98 98 - 111 mmol/L   CO2 37 (H) 22 - 32 mmol/L   Glucose, Bld 142 (H) 70 - 99 mg/dL    Comment: Glucose reference range applies only to samples taken after fasting for at least 8 hours.   BUN 36 (H) 8 - 23 mg/dL   Creatinine, Ser 2.95 (H) 0.44 - 1.00 mg/dL   Calcium 8.9 8.9 - 62.1 mg/dL   GFR, Estimated 48 (L) >60 mL/min    Comment: (NOTE) Calculated using the CKD-EPI Creatinine  Equation (2021)    Anion gap 8 5 - 15    Comment: Performed at [facility], [address] [place], [place] [address]  Glucose, capillary     Status: Abnormal  Collection Time: 06/07/22  8:16 AM  Result Value Ref Range   Glucose-Capillary 129 (H) 70 - 99 mg/dL    Comment: Glucose reference range applies only to samples taken after fasting for at least 8 hours.  Glucose, capillary     Status: Abnormal   Collection Time: 06/07/22 11:38 AM  Result Value Ref Range   Glucose-Capillary 180 (H) 70 - 99 mg/dL    Comment: Glucose reference range applies only to samples taken after fasting for at least 8 hours.  Glucose, capillary     Status: Abnormal   Collection Time: 06/07/22  4:28 PM  Result Value Ref Range   Glucose-Capillary 263 (H) 70 - 99 mg/dL    Comment: Glucose reference range applies only to samples taken after fasting for at least 8 hours.  Glucose, capillary     Status: Abnormal   Collection Time: 06/07/22 10:09 PM  Result Value Ref Range   Glucose-Capillary 166 (H) 70 - 99 mg/dL    Comment: Glucose reference range applies only to samples taken after fasting for at least 8 hours.  Basic metabolic panel     Status: Abnormal   Collection Time: 06/08/22  6:15 AM  Result Value Ref Range   Sodium 138 135 - 145 mmol/L   Potassium 3.7 3.5 - 5.1 mmol/L   Chloride 97 (L) 98 - 111 mmol/L   CO2 32 22 - 32 mmol/L   Glucose, Bld 107 (H) 70 - 99 mg/dL    Comment: Glucose reference range applies only to samples taken after fasting for at least 8 hours.   BUN 43 (H) 8 - 23 mg/dL   Creatinine, Ser 9.60 0.44 - 1.00 mg/dL   Calcium 8.9 8.9 - 45.4 mg/dL   GFR, Estimated 55 (L) >60 mL/min    Comment: (NOTE) Calculated using the CKD-EPI Creatinine Equation (2021)    Anion gap 9 5 - 15    Comment: Performed at [facility], [address] [place], [place] [address]  Glucose, capillary     Status: Abnormal   Collection Time: 06/08/22  7:42 AM  Result Value Ref Range    Glucose-Capillary 109 (H) 70 - 99 mg/dL    Comment: Glucose reference range applies only to samples taken after fasting for at least 8 hours.  Glucose, capillary     Status: Abnormal   Collection Time: 06/08/22 11:11 AM  Result Value Ref Range   Glucose-Capillary 135 (H) 70 - 99 mg/dL    Comment: Glucose reference range applies only to samples taken after fasting for at least 8 hours.  Glucose, capillary     Status: Abnormal   Collection Time: 06/08/22  3:28 PM  Result Value Ref Range   Glucose-Capillary 169 (H) 70 - 99 mg/dL    Comment: Glucose reference range applies only to samples taken after fasting for at least 8 hours.  Glucose, capillary     Status: Abnormal   Collection Time: 06/08/22  8:22 PM  Result Value Ref Range   Glucose-Capillary 263 (H) 70 - 99 mg/dL    Comment: Glucose reference range applies only to samples taken after fasting for at least 8 hours.  Glucose, capillary     Status: None   Collection Time: 06/09/22  8:25 AM  Result Value Ref Range   Glucose-Capillary 98 70 - 99 mg/dL    Comment: Glucose reference range applies only to samples taken after fasting for at least 8 hours.  Glucose, capillary     Status:  None   Collection Time: 06/09/22  8:44 AM  Result Value Ref Range   Glucose-Capillary 89 70 - 99 mg/dL    Comment: Glucose reference range applies only to samples taken after fasting for at least 8 hours.  Glucose, capillary     Status: Abnormal   Collection Time: 06/09/22 11:51 AM  Result Value Ref Range   Glucose-Capillary 108 (H) 70 - 99 mg/dL    Comment: Glucose reference range applies only to samples taken after fasting for at least 8 hours.  Glucose, capillary     Status: Abnormal   Collection Time: 06/09/22  5:19 PM  Result Value Ref Range   Glucose-Capillary 181 (H) 70 - 99 mg/dL    Comment: Glucose reference range applies only to samples taken after fasting for at least 8 hours.  Glucose, capillary     Status: Abnormal   Collection Time:  06/10/22  5:13 AM  Result Value Ref Range   Glucose-Capillary 119 (H) 70 - 99 mg/dL    Comment: Glucose reference range applies only to samples taken after fasting for at least 8 hours.  POCT Urinalysis Dipstick (Automated)     Status: Abnormal   Collection Time: 06/14/22  4:24 PM  Result Value Ref Range   Color, UA yellow    Clarity, UA clear    Glucose, UA Negative Negative   Bilirubin, UA negative    Ketones, UA ngative    Spec Grav, UA 1.010 1.010 - 1.025   Blood, UA negative    pH, UA 7.0 5.0 - 8.0   Protein, UA Negative Negative   Urobilinogen, UA 1.0 0.2 or 1.0 E.U./dL   Nitrite, UA negative    Leukocytes, UA Small (1+) (A) Negative    Comment: 70 Leu/uL  Urine Culture     Status: None   Collection Time: 06/14/22  4:50 PM   Specimen: Urine  Result Value Ref Range   MICRO NUMBER: [id number]    SPECIMEN QUALITY: Adequate    Sample Source URINE    STATUS: FINAL    Result:      Mixed genital flora isolated. These superficial bacteria are not indicative of a urinary tract infection. No further organism identification is warranted on this specimen. If clinically indicated, recollect clean-catch, mid-stream urine and transfer  immediately to Urine Culture Transport Tube.   Renal function panel     Status: Abnormal   Collection Time: 07/09/22  3:52 PM  Result Value Ref Range   Sodium 141 135 - 145 mEq/L   Potassium 3.1 (L) 3.5 - 5.1 mEq/L   Chloride 97 96 - 112 mEq/L   CO2 33 (H) 19 - 32 mEq/L   Albumin 3.9 3.5 - 5.2 g/dL   BUN 15 6 - 23 mg/dL   Creatinine, Ser 7.49 0.40 - 1.20 mg/dL   Glucose, Bld 355 (H) 70 - 99 mg/dL   Phosphorus 4.5 2.3 - 4.6 mg/dL   GFR 21.74 (L) >71.59 mL/min    Comment: Calculated using the CKD-EPI Creatinine Equation (2021)   Calcium 9.9 8.4 - 10.5 mg/dL    Radiology No results found.  Assessment/Plan Persistent atrial fibrillation Rate controlled on Pacerone   Essential hypertension blood pressure control important in reducing the  progression of atherosclerotic disease. On appropriate oral medications.  PAD (peripheral artery disease) (HCC) Her ABIs today were 1.11 on the right and 1.19 left with biphasic waveforms and digital pressures of 104 on the right and 100 on the left. Doing well.  No  symptoms.  Recheck in six months.   CKD (chronic kidney disease) stage 3, GFR 30-59 ml/min (HCC) Avoid contrast unless absolutely necessary.  Dyslipidemia lipid control important in reducing the progression of atherosclerotic disease. Continue statin therapy    Festus Barren, MD  07/16/2022 4:17 PM    This note was created with Dragon medical transcription system.  Any errors from dictation are purely unintentional

## 2022-07-16 NOTE — Assessment & Plan Note (Signed)
lipid control important in reducing the progression of atherosclerotic disease. Continue statin therapy  

## 2022-07-16 NOTE — Assessment & Plan Note (Signed)
Her ABIs today were 1.11 on the right and 1.19 left with biphasic waveforms and digital pressures of 104 on the right and 100 on the left. Doing well.  No symptoms.  Recheck in six months.

## 2022-07-17 ENCOUNTER — Other Ambulatory Visit: Payer: Medicare Other

## 2022-07-17 VITALS — BP 118/80 | HR 78 | Temp 97.1°F

## 2022-07-17 DIAGNOSIS — Z515 Encounter for palliative care: Secondary | ICD-10-CM

## 2022-07-17 NOTE — Progress Notes (Signed)
PATIENT NAME: [name] DOB: October 01, 1929 MRN: [id number]  PRIMARY CARE PROVIDER: Eustaquio Boyden, MD  RESPONSIBLE PARTY:  Acct ID - Guarantor Home Phone Work Phone Relationship Acct Type  [id number] Harold Barban* [phone]  Self P/F     [address], [place], [place] [address]   Home visit completed with daughter and patient.  Cognition:  More alert and engaging in conversation. Last 3 days have been very good for patient.  Daughter reports patient still needs cues to complete tasks.   Medication Management:  Decrease in pill burden since my last visit.    HF: Lasix decreased to 3 x weekly.  Education provided on s/s of heart failure.  Daughter will work on getting weights more frequently to monitor edema.  Currently has trace edema to left lateral foot.  Lungs remain clear.  No complaints of shortness of breath reported.   Home Health:  PT continues for 3 more weeks.  OT/ST have discharged.   Resource:  Dementia Guide provided to daughter [name].  She has been doing some research online and has been overwhelmed with the amount of information.  We discussed the condensed version in the Dementia Guide Book.  Daughter appreciative of this resource and will review.  We will discuss more on next visit.  Encouraged her to share with hired caregivers as daughter has concerns with the amount of questions patient is being asked and that she does not have the answers.  We discussed meeting patient where she is at and not focusing so much on correction.  Ensuring patient is safe and secure with caregivers.      CODE STATUS: DNR ADVANCED DIRECTIVES: Yes MOST FORM: No PPS: 40%   PHYSICAL EXAM:   VITALS: Today's Vitals   07/17/22 1603  BP: 118/80  Pulse: 78  Temp: (!) 97.1 F (36.2 C)  SpO2: 92%    LUNGS: clear to auscultation  CARDIAC: Cor irreg, irreg RRR}  EXTREMITIES: trace edema to left lateral foot SKIN: Skin color, texture, turgor normal. No rashes or  lesions or mobility and turgor normal  NEURO: positive for gait problems and memory problems       Truitt Merle, RN

## 2022-07-18 LAB — VAS US ABI WITH/WO TBI
Left ABI: 1.19
Right ABI: 1.11

## 2022-07-19 ENCOUNTER — Other Ambulatory Visit (HOSPITAL_COMMUNITY): Payer: Self-pay

## 2022-07-19 NOTE — Telephone Encounter (Signed)
Checked previous benefit verifications, Coinsurance was also 20% in 2020, 2021, and 2023.

## 2022-07-23 DIAGNOSIS — I959 Hypotension, unspecified: Secondary | ICD-10-CM | POA: Diagnosis not present

## 2022-07-23 DIAGNOSIS — E785 Hyperlipidemia, unspecified: Secondary | ICD-10-CM | POA: Diagnosis not present

## 2022-07-23 DIAGNOSIS — I4821 Permanent atrial fibrillation: Secondary | ICD-10-CM | POA: Diagnosis not present

## 2022-07-23 DIAGNOSIS — M069 Rheumatoid arthritis, unspecified: Secondary | ICD-10-CM | POA: Diagnosis not present

## 2022-07-23 DIAGNOSIS — J9601 Acute respiratory failure with hypoxia: Secondary | ICD-10-CM | POA: Diagnosis not present

## 2022-07-23 DIAGNOSIS — Z85828 Personal history of other malignant neoplasm of skin: Secondary | ICD-10-CM | POA: Diagnosis not present

## 2022-07-23 DIAGNOSIS — M1990 Unspecified osteoarthritis, unspecified site: Secondary | ICD-10-CM | POA: Diagnosis not present

## 2022-07-23 DIAGNOSIS — G629 Polyneuropathy, unspecified: Secondary | ICD-10-CM | POA: Diagnosis not present

## 2022-07-23 DIAGNOSIS — I5033 Acute on chronic diastolic (congestive) heart failure: Secondary | ICD-10-CM | POA: Diagnosis not present

## 2022-07-23 DIAGNOSIS — G2581 Restless legs syndrome: Secondary | ICD-10-CM | POA: Diagnosis not present

## 2022-07-23 DIAGNOSIS — Z7901 Long term (current) use of anticoagulants: Secondary | ICD-10-CM | POA: Diagnosis not present

## 2022-07-23 DIAGNOSIS — K449 Diaphragmatic hernia without obstruction or gangrene: Secondary | ICD-10-CM | POA: Diagnosis not present

## 2022-07-23 DIAGNOSIS — M810 Age-related osteoporosis without current pathological fracture: Secondary | ICD-10-CM | POA: Diagnosis not present

## 2022-07-23 DIAGNOSIS — Z87891 Personal history of nicotine dependence: Secondary | ICD-10-CM | POA: Diagnosis not present

## 2022-07-23 DIAGNOSIS — R739 Hyperglycemia, unspecified: Secondary | ICD-10-CM | POA: Diagnosis not present

## 2022-07-23 DIAGNOSIS — I110 Hypertensive heart disease with heart failure: Secondary | ICD-10-CM | POA: Diagnosis not present

## 2022-07-23 DIAGNOSIS — U071 COVID-19: Secondary | ICD-10-CM | POA: Diagnosis not present

## 2022-07-23 DIAGNOSIS — I2510 Atherosclerotic heart disease of native coronary artery without angina pectoris: Secondary | ICD-10-CM | POA: Diagnosis not present

## 2022-07-23 DIAGNOSIS — I739 Peripheral vascular disease, unspecified: Secondary | ICD-10-CM | POA: Diagnosis not present

## 2022-07-23 DIAGNOSIS — I071 Rheumatic tricuspid insufficiency: Secondary | ICD-10-CM | POA: Diagnosis not present

## 2022-07-23 DIAGNOSIS — A419 Sepsis, unspecified organism: Secondary | ICD-10-CM | POA: Diagnosis not present

## 2022-07-23 DIAGNOSIS — K219 Gastro-esophageal reflux disease without esophagitis: Secondary | ICD-10-CM | POA: Diagnosis not present

## 2022-07-23 DIAGNOSIS — M5137 Other intervertebral disc degeneration, lumbosacral region: Secondary | ICD-10-CM | POA: Diagnosis not present

## 2022-07-23 DIAGNOSIS — E876 Hypokalemia: Secondary | ICD-10-CM | POA: Diagnosis not present

## 2022-07-24 DIAGNOSIS — R739 Hyperglycemia, unspecified: Secondary | ICD-10-CM | POA: Diagnosis not present

## 2022-07-24 DIAGNOSIS — I2510 Atherosclerotic heart disease of native coronary artery without angina pectoris: Secondary | ICD-10-CM | POA: Diagnosis not present

## 2022-07-24 DIAGNOSIS — I739 Peripheral vascular disease, unspecified: Secondary | ICD-10-CM | POA: Diagnosis not present

## 2022-07-24 DIAGNOSIS — Z7901 Long term (current) use of anticoagulants: Secondary | ICD-10-CM | POA: Diagnosis not present

## 2022-07-24 DIAGNOSIS — I959 Hypotension, unspecified: Secondary | ICD-10-CM | POA: Diagnosis not present

## 2022-07-24 DIAGNOSIS — Z87891 Personal history of nicotine dependence: Secondary | ICD-10-CM | POA: Diagnosis not present

## 2022-07-24 DIAGNOSIS — A419 Sepsis, unspecified organism: Secondary | ICD-10-CM | POA: Diagnosis not present

## 2022-07-24 DIAGNOSIS — I4821 Permanent atrial fibrillation: Secondary | ICD-10-CM | POA: Diagnosis not present

## 2022-07-24 DIAGNOSIS — M069 Rheumatoid arthritis, unspecified: Secondary | ICD-10-CM | POA: Diagnosis not present

## 2022-07-24 DIAGNOSIS — I5033 Acute on chronic diastolic (congestive) heart failure: Secondary | ICD-10-CM | POA: Diagnosis not present

## 2022-07-24 DIAGNOSIS — K449 Diaphragmatic hernia without obstruction or gangrene: Secondary | ICD-10-CM | POA: Diagnosis not present

## 2022-07-24 DIAGNOSIS — K219 Gastro-esophageal reflux disease without esophagitis: Secondary | ICD-10-CM | POA: Diagnosis not present

## 2022-07-24 DIAGNOSIS — M5137 Other intervertebral disc degeneration, lumbosacral region: Secondary | ICD-10-CM | POA: Diagnosis not present

## 2022-07-24 DIAGNOSIS — Z85828 Personal history of other malignant neoplasm of skin: Secondary | ICD-10-CM | POA: Diagnosis not present

## 2022-07-24 DIAGNOSIS — E876 Hypokalemia: Secondary | ICD-10-CM | POA: Diagnosis not present

## 2022-07-24 DIAGNOSIS — J9601 Acute respiratory failure with hypoxia: Secondary | ICD-10-CM | POA: Diagnosis not present

## 2022-07-24 DIAGNOSIS — G629 Polyneuropathy, unspecified: Secondary | ICD-10-CM | POA: Diagnosis not present

## 2022-07-24 DIAGNOSIS — E785 Hyperlipidemia, unspecified: Secondary | ICD-10-CM | POA: Diagnosis not present

## 2022-07-24 DIAGNOSIS — I110 Hypertensive heart disease with heart failure: Secondary | ICD-10-CM | POA: Diagnosis not present

## 2022-07-24 DIAGNOSIS — I071 Rheumatic tricuspid insufficiency: Secondary | ICD-10-CM | POA: Diagnosis not present

## 2022-07-24 DIAGNOSIS — U071 COVID-19: Secondary | ICD-10-CM | POA: Diagnosis not present

## 2022-07-24 DIAGNOSIS — M810 Age-related osteoporosis without current pathological fracture: Secondary | ICD-10-CM | POA: Diagnosis not present

## 2022-07-24 DIAGNOSIS — G2581 Restless legs syndrome: Secondary | ICD-10-CM | POA: Diagnosis not present

## 2022-07-24 DIAGNOSIS — M1990 Unspecified osteoarthritis, unspecified site: Secondary | ICD-10-CM | POA: Diagnosis not present

## 2022-07-24 NOTE — Telephone Encounter (Signed)
Patient daughter called on dpr. Reviewed and set up appointments.

## 2022-07-31 ENCOUNTER — Ambulatory Visit (INDEPENDENT_AMBULATORY_CARE_PROVIDER_SITE_OTHER): Payer: Medicare Other

## 2022-07-31 DIAGNOSIS — M5137 Other intervertebral disc degeneration, lumbosacral region: Secondary | ICD-10-CM | POA: Diagnosis not present

## 2022-07-31 DIAGNOSIS — I2510 Atherosclerotic heart disease of native coronary artery without angina pectoris: Secondary | ICD-10-CM | POA: Diagnosis not present

## 2022-07-31 DIAGNOSIS — Z7901 Long term (current) use of anticoagulants: Secondary | ICD-10-CM | POA: Diagnosis not present

## 2022-07-31 DIAGNOSIS — R739 Hyperglycemia, unspecified: Secondary | ICD-10-CM | POA: Diagnosis not present

## 2022-07-31 DIAGNOSIS — Z87891 Personal history of nicotine dependence: Secondary | ICD-10-CM | POA: Diagnosis not present

## 2022-07-31 DIAGNOSIS — I071 Rheumatic tricuspid insufficiency: Secondary | ICD-10-CM | POA: Diagnosis not present

## 2022-07-31 DIAGNOSIS — U071 COVID-19: Secondary | ICD-10-CM | POA: Diagnosis not present

## 2022-07-31 DIAGNOSIS — Z85828 Personal history of other malignant neoplasm of skin: Secondary | ICD-10-CM | POA: Diagnosis not present

## 2022-07-31 DIAGNOSIS — I4821 Permanent atrial fibrillation: Secondary | ICD-10-CM | POA: Diagnosis not present

## 2022-07-31 DIAGNOSIS — E785 Hyperlipidemia, unspecified: Secondary | ICD-10-CM | POA: Diagnosis not present

## 2022-07-31 DIAGNOSIS — J9601 Acute respiratory failure with hypoxia: Secondary | ICD-10-CM | POA: Diagnosis not present

## 2022-07-31 DIAGNOSIS — E876 Hypokalemia: Secondary | ICD-10-CM | POA: Diagnosis not present

## 2022-07-31 DIAGNOSIS — A419 Sepsis, unspecified organism: Secondary | ICD-10-CM | POA: Diagnosis not present

## 2022-07-31 DIAGNOSIS — I959 Hypotension, unspecified: Secondary | ICD-10-CM | POA: Diagnosis not present

## 2022-07-31 DIAGNOSIS — K449 Diaphragmatic hernia without obstruction or gangrene: Secondary | ICD-10-CM | POA: Diagnosis not present

## 2022-07-31 DIAGNOSIS — K219 Gastro-esophageal reflux disease without esophagitis: Secondary | ICD-10-CM | POA: Diagnosis not present

## 2022-07-31 DIAGNOSIS — M810 Age-related osteoporosis without current pathological fracture: Secondary | ICD-10-CM | POA: Diagnosis not present

## 2022-07-31 DIAGNOSIS — M1990 Unspecified osteoarthritis, unspecified site: Secondary | ICD-10-CM | POA: Diagnosis not present

## 2022-07-31 DIAGNOSIS — M069 Rheumatoid arthritis, unspecified: Secondary | ICD-10-CM | POA: Diagnosis not present

## 2022-07-31 DIAGNOSIS — G2581 Restless legs syndrome: Secondary | ICD-10-CM | POA: Diagnosis not present

## 2022-07-31 DIAGNOSIS — I110 Hypertensive heart disease with heart failure: Secondary | ICD-10-CM | POA: Diagnosis not present

## 2022-07-31 DIAGNOSIS — G629 Polyneuropathy, unspecified: Secondary | ICD-10-CM | POA: Diagnosis not present

## 2022-07-31 DIAGNOSIS — I739 Peripheral vascular disease, unspecified: Secondary | ICD-10-CM | POA: Diagnosis not present

## 2022-07-31 DIAGNOSIS — I5033 Acute on chronic diastolic (congestive) heart failure: Secondary | ICD-10-CM | POA: Diagnosis not present

## 2022-07-31 MED ORDER — DENOSUMAB 60 MG/ML ~~LOC~~ SOSY
60.0000 mg | PREFILLED_SYRINGE | Freq: Once | SUBCUTANEOUS | Status: AC
  Administered 2022-07-31: 60 mg via SUBCUTANEOUS

## 2022-07-31 NOTE — Progress Notes (Signed)
Per orders of Dr. Eustaquio Boyden, injection of Prolia 60 mg given  in right arm given by Lewanda Rife. Patient tolerated injection well. Pts daughter will ck back in about 6 months for next appt for prolia.

## 2022-08-01 ENCOUNTER — Telehealth: Payer: Self-pay | Admitting: Family Medicine

## 2022-08-01 DIAGNOSIS — I5033 Acute on chronic diastolic (congestive) heart failure: Secondary | ICD-10-CM | POA: Diagnosis not present

## 2022-08-01 DIAGNOSIS — I739 Peripheral vascular disease, unspecified: Secondary | ICD-10-CM | POA: Diagnosis not present

## 2022-08-01 DIAGNOSIS — Z7901 Long term (current) use of anticoagulants: Secondary | ICD-10-CM | POA: Diagnosis not present

## 2022-08-01 DIAGNOSIS — K219 Gastro-esophageal reflux disease without esophagitis: Secondary | ICD-10-CM | POA: Diagnosis not present

## 2022-08-01 DIAGNOSIS — Z87891 Personal history of nicotine dependence: Secondary | ICD-10-CM | POA: Diagnosis not present

## 2022-08-01 DIAGNOSIS — M810 Age-related osteoporosis without current pathological fracture: Secondary | ICD-10-CM | POA: Diagnosis not present

## 2022-08-01 DIAGNOSIS — Z85828 Personal history of other malignant neoplasm of skin: Secondary | ICD-10-CM | POA: Diagnosis not present

## 2022-08-01 DIAGNOSIS — A419 Sepsis, unspecified organism: Secondary | ICD-10-CM | POA: Diagnosis not present

## 2022-08-01 DIAGNOSIS — M1990 Unspecified osteoarthritis, unspecified site: Secondary | ICD-10-CM | POA: Diagnosis not present

## 2022-08-01 DIAGNOSIS — I071 Rheumatic tricuspid insufficiency: Secondary | ICD-10-CM | POA: Diagnosis not present

## 2022-08-01 DIAGNOSIS — E876 Hypokalemia: Secondary | ICD-10-CM | POA: Diagnosis not present

## 2022-08-01 DIAGNOSIS — J9601 Acute respiratory failure with hypoxia: Secondary | ICD-10-CM | POA: Diagnosis not present

## 2022-08-01 DIAGNOSIS — K449 Diaphragmatic hernia without obstruction or gangrene: Secondary | ICD-10-CM | POA: Diagnosis not present

## 2022-08-01 DIAGNOSIS — I4821 Permanent atrial fibrillation: Secondary | ICD-10-CM | POA: Diagnosis not present

## 2022-08-01 DIAGNOSIS — E785 Hyperlipidemia, unspecified: Secondary | ICD-10-CM | POA: Diagnosis not present

## 2022-08-01 DIAGNOSIS — I110 Hypertensive heart disease with heart failure: Secondary | ICD-10-CM | POA: Diagnosis not present

## 2022-08-01 DIAGNOSIS — I959 Hypotension, unspecified: Secondary | ICD-10-CM | POA: Diagnosis not present

## 2022-08-01 DIAGNOSIS — M5137 Other intervertebral disc degeneration, lumbosacral region: Secondary | ICD-10-CM | POA: Diagnosis not present

## 2022-08-01 DIAGNOSIS — M069 Rheumatoid arthritis, unspecified: Secondary | ICD-10-CM | POA: Diagnosis not present

## 2022-08-01 DIAGNOSIS — G629 Polyneuropathy, unspecified: Secondary | ICD-10-CM | POA: Diagnosis not present

## 2022-08-01 DIAGNOSIS — G2581 Restless legs syndrome: Secondary | ICD-10-CM | POA: Diagnosis not present

## 2022-08-01 DIAGNOSIS — R739 Hyperglycemia, unspecified: Secondary | ICD-10-CM | POA: Diagnosis not present

## 2022-08-01 DIAGNOSIS — I2510 Atherosclerotic heart disease of native coronary artery without angina pectoris: Secondary | ICD-10-CM | POA: Diagnosis not present

## 2022-08-01 DIAGNOSIS — U071 COVID-19: Secondary | ICD-10-CM | POA: Diagnosis not present

## 2022-08-01 NOTE — Telephone Encounter (Signed)
Spoke with pt's daughter, [name] (on dpr), returning her call. Says pt c/o head pain at base of skull off and on. I offered OV tomorrow. She agreed but requested a late PM visit. Scheduled pt with Dr. Para March at 3:30.

## 2022-08-01 NOTE — Telephone Encounter (Signed)
Patients daughter called in stating that her mom(patient )has been experiencing a pain in the back of her head on and off. She doesn't have any other symptoms along with the pain in the back of her head,said that she's felling fine. I offered an appointment ,daughter expressed how difficult it is to get her mom up and going due to her age and would just like a phone call in the mean time with any advice that Dr has ,or any precautions to look out for.

## 2022-08-02 ENCOUNTER — Ambulatory Visit (INDEPENDENT_AMBULATORY_CARE_PROVIDER_SITE_OTHER): Payer: Medicare Other | Admitting: Family Medicine

## 2022-08-02 ENCOUNTER — Ambulatory Visit: Payer: Medicare Other | Admitting: Family Medicine

## 2022-08-02 ENCOUNTER — Encounter: Payer: Self-pay | Admitting: Family Medicine

## 2022-08-02 VITALS — BP 110/76 | HR 91 | Temp 97.3°F

## 2022-08-02 DIAGNOSIS — R519 Headache, unspecified: Secondary | ICD-10-CM | POA: Diagnosis not present

## 2022-08-02 NOTE — Progress Notes (Unsigned)
Patient gets a pain on the right side of skull, behind the R ear. Has been happening since Wednesday off and on; only last a minute or so and stops. Doesn't radiate.  No FCNAVD.  No ear pain.  No new hearing loss.  Has hearing aids at baseline.  No TMJ pain.  No skin changes noted.  Had her hair cut 4 days ago, was leaning forward over the sink.  No neck pain.    She had noted occ SOB on standing but not every time.  Then is resolves with taking a deep breath.  The sensation scares patient but it resolves with calming down.  BP has been controlled.  No voice changes noted during events.    Meds, vitals, and allergies reviewed.   ROS: Per HPI unless specifically indicated in ROS section   Nad Ncat Neck supple, no LA, no stridor.  Voice not hoarse. Rrr Ctab Bilateral mastoids nontender to palpation with normal inspection of the area. TM w/o erythema bilaterally. Neck nontender. Neck not stiff.

## 2022-08-02 NOTE — Patient Instructions (Addendum)
This could be from occipital neuralgia or muscle irritation at the mastoid.  I would try using a cold pack if needed for 5 minutes at a time. Check your pillow and position with sitting.   I would try taking 2 good breaths prior to standing.  See if that helps.    Update [place] as needed.  Take care.  Glad to see you.

## 2022-08-04 DIAGNOSIS — R519 Headache, unspecified: Secondary | ICD-10-CM | POA: Insufficient documentation

## 2022-08-04 NOTE — Assessment & Plan Note (Signed)
No symptoms now.  Discussed anatomy and differential. This could be from occipital neuralgia or muscle irritation at the mastoid, ie from the [facility].  I would try using a cold pack if needed for 5 minutes at a time.  I asked her to check her pillow and position with sitting.   She has a separate issue with her breathing upon standing.  I asked her to try taking two good breaths prior to standing.  She can see if that helps.  She can update [place] as needed.  Lungs are clear.

## 2022-08-06 ENCOUNTER — Telehealth: Payer: Self-pay | Admitting: Family Medicine

## 2022-08-06 NOTE — Telephone Encounter (Signed)
Home Health verbal orders Caller Name:Cheryl Agency Name:[facility]   Callback number: [phone]  Requesting OT/PT/Skilled nursing/Social Work/Speech:  Reason:Skilled Nursing  Frequency: 1 w - 9 w  Please forward to [facility] pool or providers CMA

## 2022-08-06 NOTE — Telephone Encounter (Signed)
Agree with this. Thanks.  

## 2022-08-07 ENCOUNTER — Telehealth: Payer: Self-pay

## 2022-08-07 DIAGNOSIS — K449 Diaphragmatic hernia without obstruction or gangrene: Secondary | ICD-10-CM | POA: Diagnosis not present

## 2022-08-07 DIAGNOSIS — U071 COVID-19: Secondary | ICD-10-CM | POA: Diagnosis not present

## 2022-08-07 DIAGNOSIS — I739 Peripheral vascular disease, unspecified: Secondary | ICD-10-CM | POA: Diagnosis not present

## 2022-08-07 DIAGNOSIS — M069 Rheumatoid arthritis, unspecified: Secondary | ICD-10-CM | POA: Diagnosis not present

## 2022-08-07 DIAGNOSIS — Z85828 Personal history of other malignant neoplasm of skin: Secondary | ICD-10-CM | POA: Diagnosis not present

## 2022-08-07 DIAGNOSIS — I4821 Permanent atrial fibrillation: Secondary | ICD-10-CM | POA: Diagnosis not present

## 2022-08-07 DIAGNOSIS — G2581 Restless legs syndrome: Secondary | ICD-10-CM | POA: Diagnosis not present

## 2022-08-07 DIAGNOSIS — M810 Age-related osteoporosis without current pathological fracture: Secondary | ICD-10-CM | POA: Diagnosis not present

## 2022-08-07 DIAGNOSIS — E785 Hyperlipidemia, unspecified: Secondary | ICD-10-CM | POA: Diagnosis not present

## 2022-08-07 DIAGNOSIS — I2510 Atherosclerotic heart disease of native coronary artery without angina pectoris: Secondary | ICD-10-CM | POA: Diagnosis not present

## 2022-08-07 DIAGNOSIS — R739 Hyperglycemia, unspecified: Secondary | ICD-10-CM | POA: Diagnosis not present

## 2022-08-07 DIAGNOSIS — M5137 Other intervertebral disc degeneration, lumbosacral region: Secondary | ICD-10-CM | POA: Diagnosis not present

## 2022-08-07 DIAGNOSIS — I959 Hypotension, unspecified: Secondary | ICD-10-CM | POA: Diagnosis not present

## 2022-08-07 DIAGNOSIS — Z87891 Personal history of nicotine dependence: Secondary | ICD-10-CM | POA: Diagnosis not present

## 2022-08-07 DIAGNOSIS — I5033 Acute on chronic diastolic (congestive) heart failure: Secondary | ICD-10-CM | POA: Diagnosis not present

## 2022-08-07 DIAGNOSIS — I110 Hypertensive heart disease with heart failure: Secondary | ICD-10-CM | POA: Diagnosis not present

## 2022-08-07 DIAGNOSIS — G629 Polyneuropathy, unspecified: Secondary | ICD-10-CM | POA: Diagnosis not present

## 2022-08-07 DIAGNOSIS — J9601 Acute respiratory failure with hypoxia: Secondary | ICD-10-CM | POA: Diagnosis not present

## 2022-08-07 DIAGNOSIS — K219 Gastro-esophageal reflux disease without esophagitis: Secondary | ICD-10-CM | POA: Diagnosis not present

## 2022-08-07 DIAGNOSIS — E876 Hypokalemia: Secondary | ICD-10-CM | POA: Diagnosis not present

## 2022-08-07 DIAGNOSIS — I071 Rheumatic tricuspid insufficiency: Secondary | ICD-10-CM | POA: Diagnosis not present

## 2022-08-07 DIAGNOSIS — Z7901 Long term (current) use of anticoagulants: Secondary | ICD-10-CM | POA: Diagnosis not present

## 2022-08-07 DIAGNOSIS — M1990 Unspecified osteoarthritis, unspecified site: Secondary | ICD-10-CM | POA: Diagnosis not present

## 2022-08-07 DIAGNOSIS — A419 Sepsis, unspecified organism: Secondary | ICD-10-CM | POA: Diagnosis not present

## 2022-08-07 NOTE — Telephone Encounter (Signed)
1 pm.  Daughter has requested appointment be changed to next week.  Visit rescheduled for 5/10@ 845 am.

## 2022-08-07 NOTE — Telephone Encounter (Signed)
Spoke with Elnita Maxwell, of [facility], notifying her Dr. Reece Agar is giving verbal orders for services requested for pt.

## 2022-08-07 NOTE — Telephone Encounter (Signed)
[name] from [employer] returned  call

## 2022-08-07 NOTE — Telephone Encounter (Signed)
Left message on voicemail for [facility] with Home Health to call back.

## 2022-08-08 ENCOUNTER — Other Ambulatory Visit: Payer: Medicare Other

## 2022-08-08 DIAGNOSIS — G629 Polyneuropathy, unspecified: Secondary | ICD-10-CM | POA: Diagnosis not present

## 2022-08-08 DIAGNOSIS — I2510 Atherosclerotic heart disease of native coronary artery without angina pectoris: Secondary | ICD-10-CM | POA: Diagnosis not present

## 2022-08-08 DIAGNOSIS — M1990 Unspecified osteoarthritis, unspecified site: Secondary | ICD-10-CM | POA: Diagnosis not present

## 2022-08-08 DIAGNOSIS — U071 COVID-19: Secondary | ICD-10-CM | POA: Diagnosis not present

## 2022-08-08 DIAGNOSIS — R739 Hyperglycemia, unspecified: Secondary | ICD-10-CM | POA: Diagnosis not present

## 2022-08-08 DIAGNOSIS — J9601 Acute respiratory failure with hypoxia: Secondary | ICD-10-CM | POA: Diagnosis not present

## 2022-08-08 DIAGNOSIS — K219 Gastro-esophageal reflux disease without esophagitis: Secondary | ICD-10-CM | POA: Diagnosis not present

## 2022-08-08 DIAGNOSIS — Z85828 Personal history of other malignant neoplasm of skin: Secondary | ICD-10-CM | POA: Diagnosis not present

## 2022-08-08 DIAGNOSIS — A419 Sepsis, unspecified organism: Secondary | ICD-10-CM | POA: Diagnosis not present

## 2022-08-08 DIAGNOSIS — Z7901 Long term (current) use of anticoagulants: Secondary | ICD-10-CM | POA: Diagnosis not present

## 2022-08-08 DIAGNOSIS — M069 Rheumatoid arthritis, unspecified: Secondary | ICD-10-CM | POA: Diagnosis not present

## 2022-08-08 DIAGNOSIS — Z87891 Personal history of nicotine dependence: Secondary | ICD-10-CM | POA: Diagnosis not present

## 2022-08-08 DIAGNOSIS — I4821 Permanent atrial fibrillation: Secondary | ICD-10-CM | POA: Diagnosis not present

## 2022-08-08 DIAGNOSIS — M810 Age-related osteoporosis without current pathological fracture: Secondary | ICD-10-CM | POA: Diagnosis not present

## 2022-08-08 DIAGNOSIS — E876 Hypokalemia: Secondary | ICD-10-CM | POA: Diagnosis not present

## 2022-08-08 DIAGNOSIS — I739 Peripheral vascular disease, unspecified: Secondary | ICD-10-CM | POA: Diagnosis not present

## 2022-08-08 DIAGNOSIS — E785 Hyperlipidemia, unspecified: Secondary | ICD-10-CM | POA: Diagnosis not present

## 2022-08-08 DIAGNOSIS — K449 Diaphragmatic hernia without obstruction or gangrene: Secondary | ICD-10-CM | POA: Diagnosis not present

## 2022-08-08 DIAGNOSIS — I071 Rheumatic tricuspid insufficiency: Secondary | ICD-10-CM | POA: Diagnosis not present

## 2022-08-08 DIAGNOSIS — I959 Hypotension, unspecified: Secondary | ICD-10-CM | POA: Diagnosis not present

## 2022-08-08 DIAGNOSIS — G2581 Restless legs syndrome: Secondary | ICD-10-CM | POA: Diagnosis not present

## 2022-08-08 DIAGNOSIS — I5033 Acute on chronic diastolic (congestive) heart failure: Secondary | ICD-10-CM | POA: Diagnosis not present

## 2022-08-08 DIAGNOSIS — M5137 Other intervertebral disc degeneration, lumbosacral region: Secondary | ICD-10-CM | POA: Diagnosis not present

## 2022-08-08 DIAGNOSIS — I110 Hypertensive heart disease with heart failure: Secondary | ICD-10-CM | POA: Diagnosis not present

## 2022-08-12 ENCOUNTER — Other Ambulatory Visit: Payer: Self-pay | Admitting: Cardiovascular Disease

## 2022-08-12 NOTE — Telephone Encounter (Signed)
Prescription refill request for Eliquis received. Indication: AF Last office visit:  05/01/22  Terrilee Files MD Scr: 0.99 on 07/09/22  Epic Age: 89 Weight: 67.1kg  Based on above findings Eliquis 5mg  twice daily is the appropriate dose.  Refill approved.

## 2022-08-13 DIAGNOSIS — I739 Peripheral vascular disease, unspecified: Secondary | ICD-10-CM | POA: Diagnosis not present

## 2022-08-13 DIAGNOSIS — M1990 Unspecified osteoarthritis, unspecified site: Secondary | ICD-10-CM | POA: Diagnosis not present

## 2022-08-13 DIAGNOSIS — M810 Age-related osteoporosis without current pathological fracture: Secondary | ICD-10-CM | POA: Diagnosis not present

## 2022-08-13 DIAGNOSIS — I959 Hypotension, unspecified: Secondary | ICD-10-CM | POA: Diagnosis not present

## 2022-08-13 DIAGNOSIS — I110 Hypertensive heart disease with heart failure: Secondary | ICD-10-CM | POA: Diagnosis not present

## 2022-08-13 DIAGNOSIS — Z85828 Personal history of other malignant neoplasm of skin: Secondary | ICD-10-CM | POA: Diagnosis not present

## 2022-08-13 DIAGNOSIS — Z7952 Long term (current) use of systemic steroids: Secondary | ICD-10-CM | POA: Diagnosis not present

## 2022-08-13 DIAGNOSIS — G2581 Restless legs syndrome: Secondary | ICD-10-CM | POA: Diagnosis not present

## 2022-08-13 DIAGNOSIS — I4821 Permanent atrial fibrillation: Secondary | ICD-10-CM | POA: Diagnosis not present

## 2022-08-13 DIAGNOSIS — E785 Hyperlipidemia, unspecified: Secondary | ICD-10-CM | POA: Diagnosis not present

## 2022-08-13 DIAGNOSIS — Z8616 Personal history of COVID-19: Secondary | ICD-10-CM | POA: Diagnosis not present

## 2022-08-13 DIAGNOSIS — Z7901 Long term (current) use of anticoagulants: Secondary | ICD-10-CM | POA: Diagnosis not present

## 2022-08-13 DIAGNOSIS — M5137 Other intervertebral disc degeneration, lumbosacral region: Secondary | ICD-10-CM | POA: Diagnosis not present

## 2022-08-13 DIAGNOSIS — G629 Polyneuropathy, unspecified: Secondary | ICD-10-CM | POA: Diagnosis not present

## 2022-08-13 DIAGNOSIS — I2510 Atherosclerotic heart disease of native coronary artery without angina pectoris: Secondary | ICD-10-CM | POA: Diagnosis not present

## 2022-08-13 DIAGNOSIS — Z9181 History of falling: Secondary | ICD-10-CM | POA: Diagnosis not present

## 2022-08-13 DIAGNOSIS — M069 Rheumatoid arthritis, unspecified: Secondary | ICD-10-CM | POA: Diagnosis not present

## 2022-08-13 DIAGNOSIS — K219 Gastro-esophageal reflux disease without esophagitis: Secondary | ICD-10-CM | POA: Diagnosis not present

## 2022-08-13 DIAGNOSIS — I5033 Acute on chronic diastolic (congestive) heart failure: Secondary | ICD-10-CM | POA: Diagnosis not present

## 2022-08-13 DIAGNOSIS — Z87891 Personal history of nicotine dependence: Secondary | ICD-10-CM | POA: Diagnosis not present

## 2022-08-13 DIAGNOSIS — I071 Rheumatic tricuspid insufficiency: Secondary | ICD-10-CM | POA: Diagnosis not present

## 2022-08-14 ENCOUNTER — Telehealth: Payer: Self-pay

## 2022-08-14 NOTE — Telephone Encounter (Signed)
1453 Palliative Care Note  RN called and spoke with daughter. Cancelled visit for this week and explained that RN Manson Passey would  call and reschedule. Voiced understanding.  Barbette Merino, RN

## 2022-08-15 ENCOUNTER — Ambulatory Visit: Payer: Medicare Other | Admitting: Dermatology

## 2022-08-15 DIAGNOSIS — I110 Hypertensive heart disease with heart failure: Secondary | ICD-10-CM | POA: Diagnosis not present

## 2022-08-15 DIAGNOSIS — I2510 Atherosclerotic heart disease of native coronary artery without angina pectoris: Secondary | ICD-10-CM | POA: Diagnosis not present

## 2022-08-15 DIAGNOSIS — I071 Rheumatic tricuspid insufficiency: Secondary | ICD-10-CM | POA: Diagnosis not present

## 2022-08-15 DIAGNOSIS — G2581 Restless legs syndrome: Secondary | ICD-10-CM | POA: Diagnosis not present

## 2022-08-15 DIAGNOSIS — Z7901 Long term (current) use of anticoagulants: Secondary | ICD-10-CM | POA: Diagnosis not present

## 2022-08-15 DIAGNOSIS — Z7952 Long term (current) use of systemic steroids: Secondary | ICD-10-CM | POA: Diagnosis not present

## 2022-08-15 DIAGNOSIS — K219 Gastro-esophageal reflux disease without esophagitis: Secondary | ICD-10-CM | POA: Diagnosis not present

## 2022-08-15 DIAGNOSIS — Z8616 Personal history of COVID-19: Secondary | ICD-10-CM | POA: Diagnosis not present

## 2022-08-15 DIAGNOSIS — I5033 Acute on chronic diastolic (congestive) heart failure: Secondary | ICD-10-CM | POA: Diagnosis not present

## 2022-08-15 DIAGNOSIS — E785 Hyperlipidemia, unspecified: Secondary | ICD-10-CM | POA: Diagnosis not present

## 2022-08-15 DIAGNOSIS — M810 Age-related osteoporosis without current pathological fracture: Secondary | ICD-10-CM | POA: Diagnosis not present

## 2022-08-15 DIAGNOSIS — I739 Peripheral vascular disease, unspecified: Secondary | ICD-10-CM | POA: Diagnosis not present

## 2022-08-15 DIAGNOSIS — M5137 Other intervertebral disc degeneration, lumbosacral region: Secondary | ICD-10-CM | POA: Diagnosis not present

## 2022-08-15 DIAGNOSIS — M069 Rheumatoid arthritis, unspecified: Secondary | ICD-10-CM | POA: Diagnosis not present

## 2022-08-15 DIAGNOSIS — Z87891 Personal history of nicotine dependence: Secondary | ICD-10-CM | POA: Diagnosis not present

## 2022-08-15 DIAGNOSIS — I959 Hypotension, unspecified: Secondary | ICD-10-CM | POA: Diagnosis not present

## 2022-08-15 DIAGNOSIS — G629 Polyneuropathy, unspecified: Secondary | ICD-10-CM | POA: Diagnosis not present

## 2022-08-15 DIAGNOSIS — Z9181 History of falling: Secondary | ICD-10-CM | POA: Diagnosis not present

## 2022-08-15 DIAGNOSIS — Z85828 Personal history of other malignant neoplasm of skin: Secondary | ICD-10-CM | POA: Diagnosis not present

## 2022-08-15 DIAGNOSIS — M1990 Unspecified osteoarthritis, unspecified site: Secondary | ICD-10-CM | POA: Diagnosis not present

## 2022-08-15 DIAGNOSIS — I4821 Permanent atrial fibrillation: Secondary | ICD-10-CM | POA: Diagnosis not present

## 2022-08-16 ENCOUNTER — Other Ambulatory Visit: Payer: Medicare Other

## 2022-08-21 DIAGNOSIS — I739 Peripheral vascular disease, unspecified: Secondary | ICD-10-CM | POA: Diagnosis not present

## 2022-08-21 DIAGNOSIS — E785 Hyperlipidemia, unspecified: Secondary | ICD-10-CM | POA: Diagnosis not present

## 2022-08-21 DIAGNOSIS — M1990 Unspecified osteoarthritis, unspecified site: Secondary | ICD-10-CM | POA: Diagnosis not present

## 2022-08-21 DIAGNOSIS — I110 Hypertensive heart disease with heart failure: Secondary | ICD-10-CM | POA: Diagnosis not present

## 2022-08-21 DIAGNOSIS — Z7952 Long term (current) use of systemic steroids: Secondary | ICD-10-CM | POA: Diagnosis not present

## 2022-08-21 DIAGNOSIS — Z87891 Personal history of nicotine dependence: Secondary | ICD-10-CM | POA: Diagnosis not present

## 2022-08-21 DIAGNOSIS — K219 Gastro-esophageal reflux disease without esophagitis: Secondary | ICD-10-CM | POA: Diagnosis not present

## 2022-08-21 DIAGNOSIS — I5033 Acute on chronic diastolic (congestive) heart failure: Secondary | ICD-10-CM | POA: Diagnosis not present

## 2022-08-21 DIAGNOSIS — M5137 Other intervertebral disc degeneration, lumbosacral region: Secondary | ICD-10-CM | POA: Diagnosis not present

## 2022-08-21 DIAGNOSIS — M069 Rheumatoid arthritis, unspecified: Secondary | ICD-10-CM | POA: Diagnosis not present

## 2022-08-21 DIAGNOSIS — Z8616 Personal history of COVID-19: Secondary | ICD-10-CM | POA: Diagnosis not present

## 2022-08-21 DIAGNOSIS — I2510 Atherosclerotic heart disease of native coronary artery without angina pectoris: Secondary | ICD-10-CM | POA: Diagnosis not present

## 2022-08-21 DIAGNOSIS — G629 Polyneuropathy, unspecified: Secondary | ICD-10-CM | POA: Diagnosis not present

## 2022-08-21 DIAGNOSIS — I4821 Permanent atrial fibrillation: Secondary | ICD-10-CM | POA: Diagnosis not present

## 2022-08-21 DIAGNOSIS — I071 Rheumatic tricuspid insufficiency: Secondary | ICD-10-CM | POA: Diagnosis not present

## 2022-08-21 DIAGNOSIS — Z7901 Long term (current) use of anticoagulants: Secondary | ICD-10-CM | POA: Diagnosis not present

## 2022-08-21 DIAGNOSIS — M810 Age-related osteoporosis without current pathological fracture: Secondary | ICD-10-CM | POA: Diagnosis not present

## 2022-08-21 DIAGNOSIS — Z85828 Personal history of other malignant neoplasm of skin: Secondary | ICD-10-CM | POA: Diagnosis not present

## 2022-08-21 DIAGNOSIS — Z9181 History of falling: Secondary | ICD-10-CM | POA: Diagnosis not present

## 2022-08-21 DIAGNOSIS — I959 Hypotension, unspecified: Secondary | ICD-10-CM | POA: Diagnosis not present

## 2022-08-21 DIAGNOSIS — G2581 Restless legs syndrome: Secondary | ICD-10-CM | POA: Diagnosis not present

## 2022-08-23 DIAGNOSIS — Z7901 Long term (current) use of anticoagulants: Secondary | ICD-10-CM | POA: Diagnosis not present

## 2022-08-23 DIAGNOSIS — I739 Peripheral vascular disease, unspecified: Secondary | ICD-10-CM | POA: Diagnosis not present

## 2022-08-23 DIAGNOSIS — I2510 Atherosclerotic heart disease of native coronary artery without angina pectoris: Secondary | ICD-10-CM | POA: Diagnosis not present

## 2022-08-23 DIAGNOSIS — Z87891 Personal history of nicotine dependence: Secondary | ICD-10-CM | POA: Diagnosis not present

## 2022-08-23 DIAGNOSIS — E785 Hyperlipidemia, unspecified: Secondary | ICD-10-CM | POA: Diagnosis not present

## 2022-08-23 DIAGNOSIS — Z8616 Personal history of COVID-19: Secondary | ICD-10-CM | POA: Diagnosis not present

## 2022-08-23 DIAGNOSIS — K219 Gastro-esophageal reflux disease without esophagitis: Secondary | ICD-10-CM | POA: Diagnosis not present

## 2022-08-23 DIAGNOSIS — M069 Rheumatoid arthritis, unspecified: Secondary | ICD-10-CM | POA: Diagnosis not present

## 2022-08-23 DIAGNOSIS — I5033 Acute on chronic diastolic (congestive) heart failure: Secondary | ICD-10-CM | POA: Diagnosis not present

## 2022-08-23 DIAGNOSIS — Z85828 Personal history of other malignant neoplasm of skin: Secondary | ICD-10-CM | POA: Diagnosis not present

## 2022-08-23 DIAGNOSIS — M810 Age-related osteoporosis without current pathological fracture: Secondary | ICD-10-CM | POA: Diagnosis not present

## 2022-08-23 DIAGNOSIS — I071 Rheumatic tricuspid insufficiency: Secondary | ICD-10-CM | POA: Diagnosis not present

## 2022-08-23 DIAGNOSIS — M1990 Unspecified osteoarthritis, unspecified site: Secondary | ICD-10-CM | POA: Diagnosis not present

## 2022-08-23 DIAGNOSIS — I4821 Permanent atrial fibrillation: Secondary | ICD-10-CM | POA: Diagnosis not present

## 2022-08-23 DIAGNOSIS — I959 Hypotension, unspecified: Secondary | ICD-10-CM | POA: Diagnosis not present

## 2022-08-23 DIAGNOSIS — Z7952 Long term (current) use of systemic steroids: Secondary | ICD-10-CM | POA: Diagnosis not present

## 2022-08-23 DIAGNOSIS — I110 Hypertensive heart disease with heart failure: Secondary | ICD-10-CM | POA: Diagnosis not present

## 2022-08-23 DIAGNOSIS — G629 Polyneuropathy, unspecified: Secondary | ICD-10-CM | POA: Diagnosis not present

## 2022-08-23 DIAGNOSIS — M5137 Other intervertebral disc degeneration, lumbosacral region: Secondary | ICD-10-CM | POA: Diagnosis not present

## 2022-08-23 DIAGNOSIS — Z9181 History of falling: Secondary | ICD-10-CM | POA: Diagnosis not present

## 2022-08-23 DIAGNOSIS — G2581 Restless legs syndrome: Secondary | ICD-10-CM | POA: Diagnosis not present

## 2022-08-28 DIAGNOSIS — M1990 Unspecified osteoarthritis, unspecified site: Secondary | ICD-10-CM | POA: Diagnosis not present

## 2022-08-28 DIAGNOSIS — G629 Polyneuropathy, unspecified: Secondary | ICD-10-CM | POA: Diagnosis not present

## 2022-08-28 DIAGNOSIS — Z85828 Personal history of other malignant neoplasm of skin: Secondary | ICD-10-CM | POA: Diagnosis not present

## 2022-08-28 DIAGNOSIS — I110 Hypertensive heart disease with heart failure: Secondary | ICD-10-CM | POA: Diagnosis not present

## 2022-08-28 DIAGNOSIS — M069 Rheumatoid arthritis, unspecified: Secondary | ICD-10-CM | POA: Diagnosis not present

## 2022-08-28 DIAGNOSIS — Z7952 Long term (current) use of systemic steroids: Secondary | ICD-10-CM | POA: Diagnosis not present

## 2022-08-28 DIAGNOSIS — E785 Hyperlipidemia, unspecified: Secondary | ICD-10-CM | POA: Diagnosis not present

## 2022-08-28 DIAGNOSIS — G2581 Restless legs syndrome: Secondary | ICD-10-CM | POA: Diagnosis not present

## 2022-08-28 DIAGNOSIS — K219 Gastro-esophageal reflux disease without esophagitis: Secondary | ICD-10-CM | POA: Diagnosis not present

## 2022-08-28 DIAGNOSIS — I4821 Permanent atrial fibrillation: Secondary | ICD-10-CM | POA: Diagnosis not present

## 2022-08-28 DIAGNOSIS — Z87891 Personal history of nicotine dependence: Secondary | ICD-10-CM | POA: Diagnosis not present

## 2022-08-28 DIAGNOSIS — I071 Rheumatic tricuspid insufficiency: Secondary | ICD-10-CM | POA: Diagnosis not present

## 2022-08-28 DIAGNOSIS — Z7901 Long term (current) use of anticoagulants: Secondary | ICD-10-CM | POA: Diagnosis not present

## 2022-08-28 DIAGNOSIS — I5033 Acute on chronic diastolic (congestive) heart failure: Secondary | ICD-10-CM | POA: Diagnosis not present

## 2022-08-28 DIAGNOSIS — I739 Peripheral vascular disease, unspecified: Secondary | ICD-10-CM | POA: Diagnosis not present

## 2022-08-28 DIAGNOSIS — Z8616 Personal history of COVID-19: Secondary | ICD-10-CM | POA: Diagnosis not present

## 2022-08-28 DIAGNOSIS — I2510 Atherosclerotic heart disease of native coronary artery without angina pectoris: Secondary | ICD-10-CM | POA: Diagnosis not present

## 2022-08-28 DIAGNOSIS — I959 Hypotension, unspecified: Secondary | ICD-10-CM | POA: Diagnosis not present

## 2022-08-28 DIAGNOSIS — Z9181 History of falling: Secondary | ICD-10-CM | POA: Diagnosis not present

## 2022-08-28 DIAGNOSIS — M5137 Other intervertebral disc degeneration, lumbosacral region: Secondary | ICD-10-CM | POA: Diagnosis not present

## 2022-08-28 DIAGNOSIS — M810 Age-related osteoporosis without current pathological fracture: Secondary | ICD-10-CM | POA: Diagnosis not present

## 2022-09-04 ENCOUNTER — Other Ambulatory Visit: Payer: Self-pay | Admitting: Cardiovascular Disease

## 2022-09-04 ENCOUNTER — Encounter: Payer: Self-pay | Admitting: Family Medicine

## 2022-09-04 DIAGNOSIS — I071 Rheumatic tricuspid insufficiency: Secondary | ICD-10-CM | POA: Diagnosis not present

## 2022-09-04 DIAGNOSIS — M1990 Unspecified osteoarthritis, unspecified site: Secondary | ICD-10-CM | POA: Diagnosis not present

## 2022-09-04 DIAGNOSIS — I2510 Atherosclerotic heart disease of native coronary artery without angina pectoris: Secondary | ICD-10-CM | POA: Diagnosis not present

## 2022-09-04 DIAGNOSIS — Z85828 Personal history of other malignant neoplasm of skin: Secondary | ICD-10-CM | POA: Diagnosis not present

## 2022-09-04 DIAGNOSIS — Z7952 Long term (current) use of systemic steroids: Secondary | ICD-10-CM | POA: Diagnosis not present

## 2022-09-04 DIAGNOSIS — G629 Polyneuropathy, unspecified: Secondary | ICD-10-CM | POA: Diagnosis not present

## 2022-09-04 DIAGNOSIS — M810 Age-related osteoporosis without current pathological fracture: Secondary | ICD-10-CM | POA: Diagnosis not present

## 2022-09-04 DIAGNOSIS — Z8616 Personal history of COVID-19: Secondary | ICD-10-CM | POA: Diagnosis not present

## 2022-09-04 DIAGNOSIS — M5137 Other intervertebral disc degeneration, lumbosacral region: Secondary | ICD-10-CM | POA: Diagnosis not present

## 2022-09-04 DIAGNOSIS — I959 Hypotension, unspecified: Secondary | ICD-10-CM | POA: Diagnosis not present

## 2022-09-04 DIAGNOSIS — I739 Peripheral vascular disease, unspecified: Secondary | ICD-10-CM | POA: Diagnosis not present

## 2022-09-04 DIAGNOSIS — Z9181 History of falling: Secondary | ICD-10-CM | POA: Diagnosis not present

## 2022-09-04 DIAGNOSIS — K219 Gastro-esophageal reflux disease without esophagitis: Secondary | ICD-10-CM | POA: Diagnosis not present

## 2022-09-04 DIAGNOSIS — Z87891 Personal history of nicotine dependence: Secondary | ICD-10-CM | POA: Diagnosis not present

## 2022-09-04 DIAGNOSIS — I5033 Acute on chronic diastolic (congestive) heart failure: Secondary | ICD-10-CM | POA: Diagnosis not present

## 2022-09-04 DIAGNOSIS — Z7901 Long term (current) use of anticoagulants: Secondary | ICD-10-CM | POA: Diagnosis not present

## 2022-09-04 DIAGNOSIS — I110 Hypertensive heart disease with heart failure: Secondary | ICD-10-CM | POA: Diagnosis not present

## 2022-09-04 DIAGNOSIS — E785 Hyperlipidemia, unspecified: Secondary | ICD-10-CM | POA: Diagnosis not present

## 2022-09-04 DIAGNOSIS — I4821 Permanent atrial fibrillation: Secondary | ICD-10-CM | POA: Diagnosis not present

## 2022-09-04 DIAGNOSIS — M069 Rheumatoid arthritis, unspecified: Secondary | ICD-10-CM | POA: Diagnosis not present

## 2022-09-04 DIAGNOSIS — G2581 Restless legs syndrome: Secondary | ICD-10-CM | POA: Diagnosis not present

## 2022-09-04 NOTE — Telephone Encounter (Signed)
ERx 

## 2022-09-04 NOTE — Telephone Encounter (Signed)
Refill request

## 2022-09-05 ENCOUNTER — Ambulatory Visit (INDEPENDENT_AMBULATORY_CARE_PROVIDER_SITE_OTHER): Payer: Medicare Other | Admitting: Family Medicine

## 2022-09-05 ENCOUNTER — Encounter: Payer: Self-pay | Admitting: Family Medicine

## 2022-09-05 VITALS — BP 134/86 | HR 77 | Temp 97.5°F | Ht 62.0 in | Wt 142.2 lb

## 2022-09-05 DIAGNOSIS — G2581 Restless legs syndrome: Secondary | ICD-10-CM

## 2022-09-05 DIAGNOSIS — R0609 Other forms of dyspnea: Secondary | ICD-10-CM | POA: Diagnosis not present

## 2022-09-05 DIAGNOSIS — G4700 Insomnia, unspecified: Secondary | ICD-10-CM

## 2022-09-05 DIAGNOSIS — I5032 Chronic diastolic (congestive) heart failure: Secondary | ICD-10-CM | POA: Diagnosis not present

## 2022-09-05 DIAGNOSIS — E876 Hypokalemia: Secondary | ICD-10-CM | POA: Diagnosis not present

## 2022-09-05 DIAGNOSIS — G3184 Mild cognitive impairment, so stated: Secondary | ICD-10-CM

## 2022-09-05 DIAGNOSIS — I272 Pulmonary hypertension, unspecified: Secondary | ICD-10-CM

## 2022-09-05 DIAGNOSIS — F331 Major depressive disorder, recurrent, moderate: Secondary | ICD-10-CM

## 2022-09-05 DIAGNOSIS — R442 Other hallucinations: Secondary | ICD-10-CM

## 2022-09-05 DIAGNOSIS — M25512 Pain in left shoulder: Secondary | ICD-10-CM

## 2022-09-05 DIAGNOSIS — G8929 Other chronic pain: Secondary | ICD-10-CM | POA: Diagnosis not present

## 2022-09-05 DIAGNOSIS — G6289 Other specified polyneuropathies: Secondary | ICD-10-CM

## 2022-09-05 DIAGNOSIS — E538 Deficiency of other specified B group vitamins: Secondary | ICD-10-CM

## 2022-09-05 DIAGNOSIS — R519 Headache, unspecified: Secondary | ICD-10-CM

## 2022-09-05 MED ORDER — MEMANTINE HCL 5 MG PO TABS: 5.0000 mg | ORAL_TABLET | Freq: Two times a day (BID) | ORAL | 6 refills | Status: DC

## 2022-09-05 NOTE — Patient Instructions (Addendum)
Ambulatory pulse ox today.  Drop namenda back to 5mg  twice daily.  Take requip 2-3 hours before bedtime.  Ok to restart gabapentin 400mg  every night.  Ok to leave tramadol as needed for pain.  For shoulder - continue ice/heat, tylenol for pain. If not improving, return for xrays of shoulder.

## 2022-09-05 NOTE — Progress Notes (Addendum)
Ph: [phone] Fax: [phone]   Patient ID: [name], female    DOB: 11-25-1929, 89 y.o.   MRN: [id number]  This visit was conducted in person.  BP 134/86   Pulse 77   Temp (!) 97.5 F (36.4 C) (Temporal)   Ht 5\' 2"  (1.575 m)   Wt 142 lb 4 oz (64.5 kg)   SpO2 94%   BMI 26.02 kg/m   BP Readings from Last 3 Encounters:  09/05/22 134/86  08/02/22 110/76  07/17/22 118/80   CC: R sided head pain, discuss sleep Subjective:   HPI: [name] is a 89 y.o. female presenting on 09/05/2022 for Headache (C/o pain on R-side of head behind ear. Seen previously for same sxs. Also, c/o having bad dreams. Pt accompanied by daughter, [name]. )   Parieto-occipital discomfort "feels heavy," no true pain or discomfort, present for 4 weeks.  No dizziness or vision change, nausea/vomiting.   Notes dyspnea when standing, ongoing for months. Previous DOE worse after COVID.   Left shoulder pain present for a few weeks. No inciting trauma/injury.   Daughter [name] notes worsening trouble getting to sleep over the past 3-4 weeks - very restless, increased vivid dreams and visual hallucinations when she wakes up. Hallucinations only present around sleep. Notes increased irritability. Ongoing short term memory difficulties.  She was off gabapentin and tizanidine. Gabapentin 400mg  QHS restarted last week.  Recurrent restless leg symptoms.   [facility] continues SN weekly.   Recent bilateral ABIs through [facility] ([facility]) reassuring (07/2022).      Relevant past medical, surgical, family and social history reviewed and updated as indicated. Interim medical history since our last visit reviewed. Allergies and medications reviewed and updated. Outpatient Medications Prior to Visit  Medication Sig Dispense Refill   acetaminophen (TYLENOL) 500 MG tablet Take 500 mg by mouth every 6 (six) hours as needed for mild pain.     atorvastatin (LIPITOR) 20 MG tablet Take 1 tablet (20 mg total) by  mouth daily. 90 tablet 3   Docusate Calcium (STOOL SOFTENER PO) Take 1 capsule by mouth daily.     DULoxetine (CYMBALTA) 30 MG capsule Take 1 capsule (30 mg total) by mouth daily. For mood and neuropathy 30 capsule 6   ELIQUIS 5 MG TABS tablet Take 1 tablet by mouth twice daily 180 tablet 1   gabapentin (NEURONTIN) 400 MG capsule Take 1 capsule (400 mg total) by mouth at bedtime.     lisinopril (ZESTRIL) 10 MG tablet Take 1 tablet (10 mg total) by mouth daily. 90 tablet 1   metoprolol succinate (TOPROL-XL) 100 MG 24 hr tablet Take 1 tablet by mouth twice daily 180 tablet 0   pantoprazole (PROTONIX) 40 MG tablet Take 1 tablet (40 mg total) by mouth daily. 90 tablet 3   polyethylene glycol (MIRALAX / GLYCOLAX) 17 g packet Take 17 g by mouth daily as needed for mild constipation. 14 each 0   prednisoLONE acetate (PRED FORTE) 1 % ophthalmic suspension Place 1 drop into the left eye daily.      rOPINIRole (REQUIP) 1 MG tablet Take 2 mg by mouth at bedtime. Takes 2.5 tablets at bedtime     traMADol (ULTRAM) 50 MG tablet Take 1-2 tablets (50-100 mg total) by mouth at bedtime as needed. 30 tablet 0   Vitamin D, Ergocalciferol, (DRISDOL) 1.25 MG (50000 UNIT) CAPS capsule Take 1 capsule (50,000 Units total) by mouth every 7 (seven) days. 12 capsule 3   furosemide (  LASIX) 20 MG tablet TAKE ONE TABLET BY MOUTH ONCE DAILY (Patient taking differently: Take 20 mg by mouth daily. Takes M/W/F) 90 tablet 1   memantine (NAMENDA) 10 MG tablet Take 1 tablet (10 mg total) by mouth 2 (two) times daily. 180 tablet 3   tizanidine (ZANAFLEX) 2 MG capsule Take 2 mg by mouth at bedtime.     furosemide (LASIX) 20 MG tablet Take 1 tablet (20 mg total) by mouth daily. Takes M/W/F     mometasone (ELOCON) 0.1 % lotion 2-4 drops daily. (Patient not taking: Reported on 08/02/2022)     No facility-administered medications prior to visit.     Per HPI unless specifically indicated in ROS section below Review of  Systems  Objective:  BP 134/86   Pulse 77   Temp (!) 97.5 F (36.4 C) (Temporal)   Ht 5\' 2"  (1.575 m)   Wt 142 lb 4 oz (64.5 kg)   SpO2 94%   BMI 26.02 kg/m   Wt Readings from Last 3 Encounters:  09/05/22 142 lb 4 oz (64.5 kg)  07/16/22 138 lb (62.6 kg)  07/09/22 137 lb 2 oz (62.2 kg)      Physical Exam Vitals and nursing note reviewed.  Constitutional:      Appearance: Normal appearance. She is not ill-appearing.     Comments:  Sitting in wheelchair Unable to complete ambulatory pulse ox without walker. See A/P section  HENT:     Head: Normocephalic and atraumatic.      Comments: Tender area, no skin changes or scalp erythema, rash, induration, swelling or warmth    Mouth/Throat:     Mouth: Mucous membranes are moist.     Pharynx: Oropharynx is clear. No oropharyngeal exudate or posterior oropharyngeal erythema.  Eyes:     Extraocular Movements: Extraocular movements intact.     Pupils: Pupils are equal, round, and reactive to light.  Neck:     Vascular: No carotid bruit.     Comments:  No vertebral bruit No significant midline or paraspinous mm discomfort to palpation Cardiovascular:     Rate and Rhythm: Normal rate and regular rhythm.     Pulses: Normal pulses.     Heart sounds: Normal heart sounds. No murmur heard. Pulmonary:     Effort: Pulmonary effort is normal. No respiratory distress.     Breath sounds: Normal breath sounds. No wheezing, rhonchi or rales.  Musculoskeletal:     Cervical back: Normal range of motion and neck supple. No rigidity or tenderness.     Right lower leg: No edema.     Left lower leg: No edema.     Comments:  R shoulder WNL L shoulder exam: No deformity of shoulders on inspection. + pain with palpation of anterior and lateral shoulder and upper arm. Limited ROM in abduction and forward flexion due to discomfort. Discomfort with rotation of humeral head in [facility] joint.  No pain with palpation of shoulder bursa  Lymphadenopathy:      Cervical: No cervical adenopathy.  Skin:    General: Skin is warm and dry.     Findings: No rash.  Neurological:     Mental Status: She is alert.  Psychiatric:        Mood and Affect: Mood normal.        Behavior: Behavior normal.       Results for orders placed or performed in visit on 07/16/22  VAS [place] ABI WITH/WO TBI  Result Value Ref Range  Right ABI 1.11    Left ABI 1.19    *Note: Due to a large number of results and/or encounters for the requested time period, some results have not been displayed. A complete set of results can be found in Results Review.   Lab Results  Component Value Date   CREATININE 0.99 07/09/2022   BUN 15 07/09/2022   NA 141 07/09/2022   K 3.1 (L) 07/09/2022   CL 97 07/09/2022   CO2 33 (H) 07/09/2022    Lab Results  Component Value Date   WBC 12.3 (H) 06/06/2022   HGB 11.7 (L) 06/06/2022   HCT 35.5 (L) 06/06/2022   MCV 95.9 06/06/2022   PLT 216 06/06/2022       09/05/2022    3:01 PM 08/02/2022    3:32 PM 07/09/2022    3:13 PM 06/19/2022    2:56 PM 08/27/2021    1:18 PM  Depression screen PHQ 2/9  Decreased Interest 1 0 2 3 0  Down, Depressed, Hopeless 1 1 0 0 0  PHQ - 2 Score 2 1 2 3  0  Altered sleeping 3 0 0 3   Tired, decreased energy 3 3 3 3    Change in appetite 2 1 2 3    Feeling bad or failure about yourself  1 0 0 0   Trouble concentrating 3 1 3 3    Moving slowly or fidgety/restless 2 1 0 3   Suicidal thoughts 0 0 0 0   PHQ-9 Score 16 7 10 18    Difficult doing work/chores Somewhat difficult Somewhat difficult Somewhat difficult Extremely dIfficult        09/05/2022    3:01 PM 08/02/2022    3:33 PM 07/09/2022    3:14 PM 06/21/2020    3:07 PM  GAD 7 : Generalized Anxiety Score  Nervous, Anxious, on Edge 3 1 0 2  Control/stop worrying 3 1 0 3  Worry too much - different things 3 1 0 3  Trouble relaxing 1 1 0 3  Restless 0 0 0 2  Easily annoyed or irritable 2 0 0 2  Afraid - awful might happen 0 0 0 1  Total GAD 7 Score 12 4  0 16  Anxiety Difficulty  Not difficult at all Not difficult at all    Assessment & Plan:   Problem List Items Addressed This Visit     Encounter for chronic pain management (Chronic)    Continue PRN rare tramadol use.      MDD (major depressive disorder), recurrent episode (HCC)    Noted worsening PHQ9/GAD7.  This in setting of recently decreasing duloxetin from 60mg  to 30mg .  Consider return to higher dose.       RLS (restless legs syndrome)    Chronic, deteriorated despite requip 2mg  nightly - she takes right at bedtime. Recommend dosing 2-3 hours prior to bedtime to take effect.       Insomnia    Chronic, deteriorated.  They've recently restarted gabapentin 400mg  nightly, also tried tizanidine and tramadol.  Continue gabapentin 400mg  nightly with prn tramadol.  Did recommend holding tizanidine at this time.  Will see effect of earlier requip dosing.  Will also drop namenda to 5mg  BID as per below.       Peripheral neuropathy    Gabapentin 400mg  recently nightly restarted.       Relevant Medications   memantine (NAMENDA) 5 MG tablet   gabapentin (NEURONTIN) 400 MG capsule   Vitamin B12 deficiency  She remains off b12 replacement. Lab Results  Component Value Date   VITAMINB12 >1504 (H) 09/12/2021         Dyspnea on exertion    Notes ongoing shortness of breath when first standing. Attempted to do ambulatory pulse ox in office however she was unable to tolerate as we didn't have walker for her to use. Daughter will ambulate her at home and monitor oxygen levels and report back to [place] - seeing if she may benefit from supplemental oxygen.       Pulmonary hypertension, unspecified (HCC)    Stable on latest echo 04/2022      Relevant Medications   furosemide (LASIX) 20 MG tablet   Chronic heart failure with preserved ejection fraction (HFpEF) (HCC)    Seems euvolemic. Weight gain noted since dropping lasix to MWF dosing but ho increased pedal edema or dyspnea  at rest.       Relevant Medications   furosemide (LASIX) 20 MG tablet   MCI (mild cognitive impairment) with memory loss    Ongoing difficulty predominantly short term memory loss.  Daughter notes possible increased agitation, irritability, and now visual hallucinations.  Will drop namenda to 5mg  bid to see if any effect on above symptoms. It was previously increased 01/2022.  Aricept being avoided due to h/o gastric ulcer.       Hypokalemia    Will need to verify she continues OTC potassium with lasix.       Pressure in head - Primary    Notes ongoing intermittent R parieto-occipital pressure/fullness sensation, of unclear etiology.  No abnormality on skin exam of scalp  Nonfocal neurological exam.  No significant neck pain - doubt cervicogenic pain or TTH.  Not consistent with occipital neuralgia.  Will continue to monitor for now, if not improving over time consider rpt head imaging (last head CT done 2022).       Relevant Medications   gabapentin (NEURONTIN) 400 MG capsule   Hypnopompic hallucination    Ddx visual hallucinations includes medication-related, hypoxic, metabolic, psychiatric or dementia related. Current episodes only present surrounding sleep, usually when awakening. Anticipate benign hypnopompic visual hallucinations, don't seem distressing to patient.  Will continue to monitor.  Consider nocturnal oximetry.  See below re: ambulatory pulse ox      Left shoulder pain    Acute left shoulder pain without inciting trauma/injury.  Suspect arthritis/tendonitis.  Rec continue tylenol, ice/heat, tramadol for breakthrough pain.  If not better, offered return for shoulder xrays.         Meds ordered this encounter  Medications   memantine (NAMENDA) 5 MG tablet    Sig: Take 1 tablet (5 mg total) by mouth 2 (two) times daily.    Dispense:  60 tablet    Refill:  6    Note new dose   Potassium 99 MG TABS    Sig: Take 1 tablet (99 mg total) by mouth daily as  needed (with lasix).    No orders of the defined types were placed in this encounter.   Patient Instructions  Ambulatory pulse ox today.  Drop namenda back to 5mg  twice daily.  Take requip 2-3 hours before bedtime.  Ok to restart gabapentin 400mg  every night.  Ok to leave tramadol as needed for pain.  For shoulder - continue ice/heat, tylenol for pain. If not improving, return for xrays of shoulder.   Follow up plan: Return if symptoms worsen or fail to improve.  Eustaquio Boyden, MD

## 2022-09-06 DIAGNOSIS — R442 Other hallucinations: Secondary | ICD-10-CM | POA: Insufficient documentation

## 2022-09-06 DIAGNOSIS — M25512 Pain in left shoulder: Secondary | ICD-10-CM | POA: Insufficient documentation

## 2022-09-06 MED ORDER — POTASSIUM 99 MG PO TABS: 1.0000 | ORAL_TABLET | Freq: Every day | ORAL | Status: DC | PRN

## 2022-09-06 NOTE — Assessment & Plan Note (Addendum)
Gabapentin 400mg  recently nightly restarted.

## 2022-09-06 NOTE — Assessment & Plan Note (Signed)
Noted worsening PHQ9/GAD7.  This in setting of recently decreasing duloxetin from 60mg  to 30mg .  Consider return to higher dose.

## 2022-09-06 NOTE — Assessment & Plan Note (Addendum)
Ongoing difficulty predominantly short term memory loss.  Daughter notes possible increased agitation, irritability, and now visual hallucinations.  Will drop namenda to 5mg  bid to see if any effect on above symptoms. It was previously increased 01/2022.  Aricept being avoided due to h/o gastric ulcer.

## 2022-09-06 NOTE — Assessment & Plan Note (Signed)
Chronic, deteriorated.  They've recently restarted gabapentin 400mg  nightly, also tried tizanidine and tramadol.  Continue gabapentin 400mg  nightly with prn tramadol.  Did recommend holding tizanidine at this time.  Will see effect of earlier requip dosing.  Will also drop namenda to 5mg  BID as per below.

## 2022-09-06 NOTE — Assessment & Plan Note (Addendum)
Stable on latest echo 04/2022

## 2022-09-06 NOTE — Assessment & Plan Note (Signed)
Seems euvolemic. Weight gain noted since dropping lasix to MWF dosing but ho increased pedal edema or dyspnea at rest.

## 2022-09-06 NOTE — Assessment & Plan Note (Signed)
Will need to verify she continues OTC potassium with lasix.

## 2022-09-06 NOTE — Assessment & Plan Note (Addendum)
Notes ongoing shortness of breath when first standing. Attempted to do ambulatory pulse ox in office however she was unable to tolerate as we didn't have walker for her to use. Daughter will ambulate her at home and monitor oxygen levels and report back to [place] - seeing if she may benefit from supplemental oxygen.

## 2022-09-06 NOTE — Assessment & Plan Note (Signed)
Chronic, deteriorated despite requip 2mg  nightly - she takes right at bedtime. Recommend dosing 2-3 hours prior to bedtime to take effect.

## 2022-09-06 NOTE — Assessment & Plan Note (Addendum)
Acute left shoulder pain without inciting trauma/injury.  Suspect arthritis/tendonitis.  Rec continue tylenol, ice/heat, tramadol for breakthrough pain.  If not better, offered return for shoulder xrays.

## 2022-09-06 NOTE — Assessment & Plan Note (Addendum)
She remains off b12 replacement. Lab Results  Component Value Date   VITAMINB12 >1504 (H) 09/12/2021

## 2022-09-06 NOTE — Assessment & Plan Note (Signed)
Notes ongoing intermittent R parieto-occipital pressure/fullness sensation, of unclear etiology.  No abnormality on skin exam of scalp  Nonfocal neurological exam.  No significant neck pain - doubt cervicogenic pain or TTH.  Not consistent with occipital neuralgia.  Will continue to monitor for now, if not improving over time consider rpt head imaging (last head CT done 2022).

## 2022-09-06 NOTE — Addendum Note (Signed)
Addended by: Eustaquio Boyden on: 09/06/2022 10:22 AM   Modules accepted: Level of Service

## 2022-09-06 NOTE — Assessment & Plan Note (Signed)
Continue PRN rare tramadol use.

## 2022-09-06 NOTE — Assessment & Plan Note (Addendum)
Ddx visual hallucinations includes medication-related, hypoxic, metabolic, psychiatric or dementia related. Current episodes only present surrounding sleep, usually when awakening. Anticipate benign hypnopompic visual hallucinations, don't seem distressing to patient.  Will continue to monitor.  Consider nocturnal oximetry.  See below re: ambulatory pulse ox

## 2022-09-10 ENCOUNTER — Telehealth: Payer: Self-pay | Admitting: Cardiovascular Disease

## 2022-09-10 MED ORDER — METOPROLOL SUCCINATE ER 100 MG PO TB24: 100.0000 mg | ORAL_TABLET | Freq: Two times a day (BID) | ORAL | 0 refills | Status: DC

## 2022-09-10 NOTE — Telephone Encounter (Signed)
Requested Prescriptions   Signed Prescriptions Disp Refills   metoprolol succinate (TOPROL-XL) 100 MG 24 hr tablet 180 tablet 0    Sig: Take 1 tablet (100 mg total) by mouth 2 (two) times daily. Please call the office to schedule follow up prior to further refills.    Authorizing Provider: Lorine Bears A    Ordering User: Guerry Minors

## 2022-09-10 NOTE — Telephone Encounter (Deleted)
Last seen 05/01/22 with plan of 6 month f/u  Please call patient to schedule 6 month follow up.  Thanks! 

## 2022-09-10 NOTE — Telephone Encounter (Signed)
*  STAT* If patient is at the pharmacy, call can be transferred to refill team.   1. Which medications need to be refilled? (please list name of each medication and dose if known) metoprolol succinate (TOPROL-XL) 100 MG 24 hr tablet   2. Which pharmacy/location (including street and city if local pharmacy) is medication to be sent to?  [facility] - [place] - [address]     3. Do they need a 30 day or 90 day supply? 90

## 2022-09-10 NOTE — Telephone Encounter (Signed)
Last seen 05/01/22 with plan of 6 month f/u  Please call patient to schedule 6 month follow up.  Thanks!

## 2022-09-11 DIAGNOSIS — K219 Gastro-esophageal reflux disease without esophagitis: Secondary | ICD-10-CM | POA: Diagnosis not present

## 2022-09-11 DIAGNOSIS — G2581 Restless legs syndrome: Secondary | ICD-10-CM | POA: Diagnosis not present

## 2022-09-11 DIAGNOSIS — G629 Polyneuropathy, unspecified: Secondary | ICD-10-CM | POA: Diagnosis not present

## 2022-09-11 DIAGNOSIS — I4821 Permanent atrial fibrillation: Secondary | ICD-10-CM | POA: Diagnosis not present

## 2022-09-11 DIAGNOSIS — M069 Rheumatoid arthritis, unspecified: Secondary | ICD-10-CM | POA: Diagnosis not present

## 2022-09-11 DIAGNOSIS — E785 Hyperlipidemia, unspecified: Secondary | ICD-10-CM | POA: Diagnosis not present

## 2022-09-11 DIAGNOSIS — M5137 Other intervertebral disc degeneration, lumbosacral region: Secondary | ICD-10-CM | POA: Diagnosis not present

## 2022-09-11 DIAGNOSIS — Z85828 Personal history of other malignant neoplasm of skin: Secondary | ICD-10-CM | POA: Diagnosis not present

## 2022-09-11 DIAGNOSIS — I739 Peripheral vascular disease, unspecified: Secondary | ICD-10-CM | POA: Diagnosis not present

## 2022-09-11 DIAGNOSIS — M1990 Unspecified osteoarthritis, unspecified site: Secondary | ICD-10-CM | POA: Diagnosis not present

## 2022-09-11 DIAGNOSIS — I959 Hypotension, unspecified: Secondary | ICD-10-CM | POA: Diagnosis not present

## 2022-09-11 DIAGNOSIS — Z87891 Personal history of nicotine dependence: Secondary | ICD-10-CM | POA: Diagnosis not present

## 2022-09-11 DIAGNOSIS — I071 Rheumatic tricuspid insufficiency: Secondary | ICD-10-CM | POA: Diagnosis not present

## 2022-09-11 DIAGNOSIS — Z7952 Long term (current) use of systemic steroids: Secondary | ICD-10-CM | POA: Diagnosis not present

## 2022-09-11 DIAGNOSIS — I2510 Atherosclerotic heart disease of native coronary artery without angina pectoris: Secondary | ICD-10-CM | POA: Diagnosis not present

## 2022-09-11 DIAGNOSIS — Z7901 Long term (current) use of anticoagulants: Secondary | ICD-10-CM | POA: Diagnosis not present

## 2022-09-11 DIAGNOSIS — M810 Age-related osteoporosis without current pathological fracture: Secondary | ICD-10-CM | POA: Diagnosis not present

## 2022-09-11 DIAGNOSIS — I5033 Acute on chronic diastolic (congestive) heart failure: Secondary | ICD-10-CM | POA: Diagnosis not present

## 2022-09-11 DIAGNOSIS — I110 Hypertensive heart disease with heart failure: Secondary | ICD-10-CM | POA: Diagnosis not present

## 2022-09-11 DIAGNOSIS — Z8616 Personal history of COVID-19: Secondary | ICD-10-CM | POA: Diagnosis not present

## 2022-09-11 DIAGNOSIS — Z9181 History of falling: Secondary | ICD-10-CM | POA: Diagnosis not present

## 2022-09-16 ENCOUNTER — Encounter: Payer: Self-pay | Admitting: Family Medicine

## 2022-09-16 DIAGNOSIS — E876 Hypokalemia: Secondary | ICD-10-CM

## 2022-09-16 DIAGNOSIS — N1831 Chronic kidney disease, stage 3a: Secondary | ICD-10-CM

## 2022-09-16 DIAGNOSIS — G2581 Restless legs syndrome: Secondary | ICD-10-CM

## 2022-09-16 DIAGNOSIS — R519 Headache, unspecified: Secondary | ICD-10-CM

## 2022-09-18 DIAGNOSIS — M069 Rheumatoid arthritis, unspecified: Secondary | ICD-10-CM | POA: Diagnosis not present

## 2022-09-18 DIAGNOSIS — M1990 Unspecified osteoarthritis, unspecified site: Secondary | ICD-10-CM | POA: Diagnosis not present

## 2022-09-18 DIAGNOSIS — I959 Hypotension, unspecified: Secondary | ICD-10-CM | POA: Diagnosis not present

## 2022-09-18 DIAGNOSIS — K219 Gastro-esophageal reflux disease without esophagitis: Secondary | ICD-10-CM | POA: Diagnosis not present

## 2022-09-18 DIAGNOSIS — Z87891 Personal history of nicotine dependence: Secondary | ICD-10-CM | POA: Diagnosis not present

## 2022-09-18 DIAGNOSIS — I4821 Permanent atrial fibrillation: Secondary | ICD-10-CM | POA: Diagnosis not present

## 2022-09-18 DIAGNOSIS — E785 Hyperlipidemia, unspecified: Secondary | ICD-10-CM | POA: Diagnosis not present

## 2022-09-18 DIAGNOSIS — I5033 Acute on chronic diastolic (congestive) heart failure: Secondary | ICD-10-CM | POA: Diagnosis not present

## 2022-09-18 DIAGNOSIS — I2510 Atherosclerotic heart disease of native coronary artery without angina pectoris: Secondary | ICD-10-CM | POA: Diagnosis not present

## 2022-09-18 DIAGNOSIS — Z7901 Long term (current) use of anticoagulants: Secondary | ICD-10-CM | POA: Diagnosis not present

## 2022-09-18 DIAGNOSIS — I071 Rheumatic tricuspid insufficiency: Secondary | ICD-10-CM | POA: Diagnosis not present

## 2022-09-18 DIAGNOSIS — Z85828 Personal history of other malignant neoplasm of skin: Secondary | ICD-10-CM | POA: Diagnosis not present

## 2022-09-18 DIAGNOSIS — G629 Polyneuropathy, unspecified: Secondary | ICD-10-CM | POA: Diagnosis not present

## 2022-09-18 DIAGNOSIS — I739 Peripheral vascular disease, unspecified: Secondary | ICD-10-CM | POA: Diagnosis not present

## 2022-09-18 DIAGNOSIS — Z9181 History of falling: Secondary | ICD-10-CM | POA: Diagnosis not present

## 2022-09-18 DIAGNOSIS — Z7952 Long term (current) use of systemic steroids: Secondary | ICD-10-CM | POA: Diagnosis not present

## 2022-09-18 DIAGNOSIS — I110 Hypertensive heart disease with heart failure: Secondary | ICD-10-CM | POA: Diagnosis not present

## 2022-09-18 DIAGNOSIS — G2581 Restless legs syndrome: Secondary | ICD-10-CM | POA: Diagnosis not present

## 2022-09-18 DIAGNOSIS — M810 Age-related osteoporosis without current pathological fracture: Secondary | ICD-10-CM | POA: Diagnosis not present

## 2022-09-18 DIAGNOSIS — Z8616 Personal history of COVID-19: Secondary | ICD-10-CM | POA: Diagnosis not present

## 2022-09-18 DIAGNOSIS — M5137 Other intervertebral disc degeneration, lumbosacral region: Secondary | ICD-10-CM | POA: Diagnosis not present

## 2022-09-19 DIAGNOSIS — Z8616 Personal history of COVID-19: Secondary | ICD-10-CM | POA: Diagnosis not present

## 2022-09-19 DIAGNOSIS — Z85828 Personal history of other malignant neoplasm of skin: Secondary | ICD-10-CM | POA: Diagnosis not present

## 2022-09-19 DIAGNOSIS — E785 Hyperlipidemia, unspecified: Secondary | ICD-10-CM | POA: Diagnosis not present

## 2022-09-19 DIAGNOSIS — M810 Age-related osteoporosis without current pathological fracture: Secondary | ICD-10-CM | POA: Diagnosis not present

## 2022-09-19 DIAGNOSIS — I5033 Acute on chronic diastolic (congestive) heart failure: Secondary | ICD-10-CM | POA: Diagnosis not present

## 2022-09-19 DIAGNOSIS — I4821 Permanent atrial fibrillation: Secondary | ICD-10-CM | POA: Diagnosis not present

## 2022-09-19 DIAGNOSIS — I959 Hypotension, unspecified: Secondary | ICD-10-CM | POA: Diagnosis not present

## 2022-09-19 DIAGNOSIS — I071 Rheumatic tricuspid insufficiency: Secondary | ICD-10-CM | POA: Diagnosis not present

## 2022-09-19 DIAGNOSIS — I2510 Atherosclerotic heart disease of native coronary artery without angina pectoris: Secondary | ICD-10-CM | POA: Diagnosis not present

## 2022-09-19 DIAGNOSIS — Z7901 Long term (current) use of anticoagulants: Secondary | ICD-10-CM | POA: Diagnosis not present

## 2022-09-19 DIAGNOSIS — G629 Polyneuropathy, unspecified: Secondary | ICD-10-CM | POA: Diagnosis not present

## 2022-09-19 DIAGNOSIS — M5137 Other intervertebral disc degeneration, lumbosacral region: Secondary | ICD-10-CM | POA: Diagnosis not present

## 2022-09-19 DIAGNOSIS — Z87891 Personal history of nicotine dependence: Secondary | ICD-10-CM | POA: Diagnosis not present

## 2022-09-19 DIAGNOSIS — I739 Peripheral vascular disease, unspecified: Secondary | ICD-10-CM | POA: Diagnosis not present

## 2022-09-19 DIAGNOSIS — I110 Hypertensive heart disease with heart failure: Secondary | ICD-10-CM | POA: Diagnosis not present

## 2022-09-19 DIAGNOSIS — Z9181 History of falling: Secondary | ICD-10-CM | POA: Diagnosis not present

## 2022-09-19 DIAGNOSIS — G2581 Restless legs syndrome: Secondary | ICD-10-CM | POA: Diagnosis not present

## 2022-09-19 DIAGNOSIS — M1990 Unspecified osteoarthritis, unspecified site: Secondary | ICD-10-CM | POA: Diagnosis not present

## 2022-09-19 DIAGNOSIS — K219 Gastro-esophageal reflux disease without esophagitis: Secondary | ICD-10-CM | POA: Diagnosis not present

## 2022-09-19 DIAGNOSIS — M069 Rheumatoid arthritis, unspecified: Secondary | ICD-10-CM | POA: Diagnosis not present

## 2022-09-19 DIAGNOSIS — Z7952 Long term (current) use of systemic steroids: Secondary | ICD-10-CM | POA: Diagnosis not present

## 2022-09-19 NOTE — Telephone Encounter (Signed)
Do you want [place] to call and set up follow up in office?

## 2022-09-19 NOTE — Telephone Encounter (Signed)
Patient called in regarding this message.She would like a phone call from Misty Stanley if possible.

## 2022-09-20 ENCOUNTER — Other Ambulatory Visit: Payer: Self-pay | Admitting: Family Medicine

## 2022-09-20 DIAGNOSIS — E559 Vitamin D deficiency, unspecified: Secondary | ICD-10-CM

## 2022-09-20 NOTE — Telephone Encounter (Signed)
E-scribed refill.  Plz schedule CPE and fasting (no food/drink- except water and/or blk coffee 5 hrs prior) lab visits for additional refills.  

## 2022-09-23 ENCOUNTER — Telehealth: Payer: Self-pay | Admitting: Family Medicine

## 2022-09-23 NOTE — Telephone Encounter (Signed)
Called left message for [facility] on secured voice mail.

## 2022-09-23 NOTE — Telephone Encounter (Signed)
Agree with this. Thanks.  

## 2022-09-23 NOTE — Telephone Encounter (Signed)
Spoke with patient daughter. Patient is having labs drawn on Thursday, June 20 and she has a follow up in July. She stated that she will bring her to these appointments and if she still need to have a CPE she will schedule after that appointment. Thank you!

## 2022-09-23 NOTE — Telephone Encounter (Signed)
Noted  

## 2022-09-23 NOTE — Telephone Encounter (Signed)
Home Health verbal orders Caller Name:[place] Agency Name: Melony Overly number: [phone]  Requesting OT/PT/Skilled nursing/Social Work/Speech: PT  Reason:STRENGTHENING ,GATE ,BALANCE  Frequency:1wk 9  Please forward to [facility] pool or providers CMA

## 2022-09-24 DIAGNOSIS — Z9181 History of falling: Secondary | ICD-10-CM

## 2022-09-24 DIAGNOSIS — K219 Gastro-esophageal reflux disease without esophagitis: Secondary | ICD-10-CM | POA: Diagnosis not present

## 2022-09-24 DIAGNOSIS — I959 Hypotension, unspecified: Secondary | ICD-10-CM | POA: Diagnosis not present

## 2022-09-24 DIAGNOSIS — F419 Anxiety disorder, unspecified: Secondary | ICD-10-CM

## 2022-09-24 DIAGNOSIS — I2510 Atherosclerotic heart disease of native coronary artery without angina pectoris: Secondary | ICD-10-CM | POA: Diagnosis not present

## 2022-09-24 DIAGNOSIS — E785 Hyperlipidemia, unspecified: Secondary | ICD-10-CM | POA: Diagnosis not present

## 2022-09-24 DIAGNOSIS — I4821 Permanent atrial fibrillation: Secondary | ICD-10-CM | POA: Diagnosis not present

## 2022-09-24 DIAGNOSIS — M069 Rheumatoid arthritis, unspecified: Secondary | ICD-10-CM | POA: Diagnosis not present

## 2022-09-24 DIAGNOSIS — Z8616 Personal history of COVID-19: Secondary | ICD-10-CM

## 2022-09-24 DIAGNOSIS — U071 COVID-19: Secondary | ICD-10-CM

## 2022-09-24 DIAGNOSIS — Z87891 Personal history of nicotine dependence: Secondary | ICD-10-CM

## 2022-09-24 DIAGNOSIS — G2581 Restless legs syndrome: Secondary | ICD-10-CM | POA: Diagnosis not present

## 2022-09-24 DIAGNOSIS — I071 Rheumatic tricuspid insufficiency: Secondary | ICD-10-CM | POA: Diagnosis not present

## 2022-09-24 DIAGNOSIS — G629 Polyneuropathy, unspecified: Secondary | ICD-10-CM | POA: Diagnosis not present

## 2022-09-24 DIAGNOSIS — Z85828 Personal history of other malignant neoplasm of skin: Secondary | ICD-10-CM

## 2022-09-24 DIAGNOSIS — I110 Hypertensive heart disease with heart failure: Secondary | ICD-10-CM | POA: Diagnosis not present

## 2022-09-24 DIAGNOSIS — M1990 Unspecified osteoarthritis, unspecified site: Secondary | ICD-10-CM

## 2022-09-24 DIAGNOSIS — Z7901 Long term (current) use of anticoagulants: Secondary | ICD-10-CM

## 2022-09-24 DIAGNOSIS — I739 Peripheral vascular disease, unspecified: Secondary | ICD-10-CM | POA: Diagnosis not present

## 2022-09-24 DIAGNOSIS — Z7952 Long term (current) use of systemic steroids: Secondary | ICD-10-CM

## 2022-09-24 DIAGNOSIS — M5137 Other intervertebral disc degeneration, lumbosacral region: Secondary | ICD-10-CM

## 2022-09-24 DIAGNOSIS — M810 Age-related osteoporosis without current pathological fracture: Secondary | ICD-10-CM

## 2022-09-24 DIAGNOSIS — I5033 Acute on chronic diastolic (congestive) heart failure: Secondary | ICD-10-CM | POA: Diagnosis not present

## 2022-09-26 ENCOUNTER — Other Ambulatory Visit (INDEPENDENT_AMBULATORY_CARE_PROVIDER_SITE_OTHER): Payer: Medicare Other

## 2022-09-26 ENCOUNTER — Other Ambulatory Visit: Payer: Self-pay | Admitting: Family Medicine

## 2022-09-26 DIAGNOSIS — K219 Gastro-esophageal reflux disease without esophagitis: Secondary | ICD-10-CM | POA: Diagnosis not present

## 2022-09-26 DIAGNOSIS — E876 Hypokalemia: Secondary | ICD-10-CM

## 2022-09-26 DIAGNOSIS — I4821 Permanent atrial fibrillation: Secondary | ICD-10-CM | POA: Diagnosis not present

## 2022-09-26 DIAGNOSIS — M1990 Unspecified osteoarthritis, unspecified site: Secondary | ICD-10-CM | POA: Diagnosis not present

## 2022-09-26 DIAGNOSIS — I739 Peripheral vascular disease, unspecified: Secondary | ICD-10-CM | POA: Diagnosis not present

## 2022-09-26 DIAGNOSIS — Z87891 Personal history of nicotine dependence: Secondary | ICD-10-CM | POA: Diagnosis not present

## 2022-09-26 DIAGNOSIS — I110 Hypertensive heart disease with heart failure: Secondary | ICD-10-CM | POA: Diagnosis not present

## 2022-09-26 DIAGNOSIS — I2510 Atherosclerotic heart disease of native coronary artery without angina pectoris: Secondary | ICD-10-CM | POA: Diagnosis not present

## 2022-09-26 DIAGNOSIS — N1831 Chronic kidney disease, stage 3a: Secondary | ICD-10-CM | POA: Diagnosis not present

## 2022-09-26 DIAGNOSIS — Z8616 Personal history of COVID-19: Secondary | ICD-10-CM | POA: Diagnosis not present

## 2022-09-26 DIAGNOSIS — M5416 Radiculopathy, lumbar region: Secondary | ICD-10-CM | POA: Diagnosis not present

## 2022-09-26 DIAGNOSIS — Z85828 Personal history of other malignant neoplasm of skin: Secondary | ICD-10-CM | POA: Diagnosis not present

## 2022-09-26 DIAGNOSIS — R519 Headache, unspecified: Secondary | ICD-10-CM | POA: Diagnosis not present

## 2022-09-26 DIAGNOSIS — G2581 Restless legs syndrome: Secondary | ICD-10-CM | POA: Diagnosis not present

## 2022-09-26 DIAGNOSIS — Z7901 Long term (current) use of anticoagulants: Secondary | ICD-10-CM | POA: Diagnosis not present

## 2022-09-26 DIAGNOSIS — I5033 Acute on chronic diastolic (congestive) heart failure: Secondary | ICD-10-CM | POA: Diagnosis not present

## 2022-09-26 DIAGNOSIS — I071 Rheumatic tricuspid insufficiency: Secondary | ICD-10-CM | POA: Diagnosis not present

## 2022-09-26 DIAGNOSIS — Z7952 Long term (current) use of systemic steroids: Secondary | ICD-10-CM | POA: Diagnosis not present

## 2022-09-26 DIAGNOSIS — M069 Rheumatoid arthritis, unspecified: Secondary | ICD-10-CM | POA: Diagnosis not present

## 2022-09-26 DIAGNOSIS — M62830 Muscle spasm of back: Secondary | ICD-10-CM | POA: Diagnosis not present

## 2022-09-26 DIAGNOSIS — U071 COVID-19: Secondary | ICD-10-CM | POA: Diagnosis not present

## 2022-09-26 DIAGNOSIS — M48062 Spinal stenosis, lumbar region with neurogenic claudication: Secondary | ICD-10-CM | POA: Diagnosis not present

## 2022-09-26 DIAGNOSIS — M810 Age-related osteoporosis without current pathological fracture: Secondary | ICD-10-CM | POA: Diagnosis not present

## 2022-09-26 DIAGNOSIS — M7542 Impingement syndrome of left shoulder: Secondary | ICD-10-CM | POA: Diagnosis not present

## 2022-09-26 DIAGNOSIS — M5137 Other intervertebral disc degeneration, lumbosacral region: Secondary | ICD-10-CM | POA: Diagnosis not present

## 2022-09-26 DIAGNOSIS — M5136 Other intervertebral disc degeneration, lumbar region: Secondary | ICD-10-CM

## 2022-09-26 DIAGNOSIS — I959 Hypotension, unspecified: Secondary | ICD-10-CM | POA: Diagnosis not present

## 2022-09-26 DIAGNOSIS — E785 Hyperlipidemia, unspecified: Secondary | ICD-10-CM | POA: Diagnosis not present

## 2022-09-26 DIAGNOSIS — Z9181 History of falling: Secondary | ICD-10-CM | POA: Diagnosis not present

## 2022-09-26 DIAGNOSIS — G629 Polyneuropathy, unspecified: Secondary | ICD-10-CM | POA: Diagnosis not present

## 2022-09-26 LAB — IBC PANEL
Iron: 65 ug/dL (ref 42–145)
Saturation Ratios: 18.1 % — ABNORMAL LOW (ref 20.0–50.0)
TIBC: 358.4 ug/dL (ref 250.0–450.0)
Transferrin: 256 mg/dL (ref 212.0–360.0)

## 2022-09-26 LAB — CBC WITH DIFFERENTIAL/PLATELET
Basophils Absolute: 0.1 10*3/uL (ref 0.0–0.1)
Basophils Relative: 1.8 % (ref 0.0–3.0)
Eosinophils Absolute: 0.2 10*3/uL (ref 0.0–0.7)
Eosinophils Relative: 2.5 % (ref 0.0–5.0)
HCT: 39.6 % (ref 36.0–46.0)
Hemoglobin: 12.8 g/dL (ref 12.0–15.0)
Lymphocytes Relative: 33.7 % (ref 12.0–46.0)
Lymphs Abs: 2.2 10*3/uL (ref 0.7–4.0)
MCHC: 32.5 g/dL (ref 30.0–36.0)
MCV: 93.7 fl (ref 78.0–100.0)
Monocytes Absolute: 0.6 10*3/uL (ref 0.1–1.0)
Monocytes Relative: 8.6 % (ref 3.0–12.0)
Neutro Abs: 3.5 10*3/uL (ref 1.4–7.7)
Neutrophils Relative %: 53.4 % (ref 43.0–77.0)
Platelets: 263 10*3/uL (ref 150.0–400.0)
RBC: 4.23 Mil/uL (ref 3.87–5.11)
RDW: 14.2 % (ref 11.5–15.5)
WBC: 6.5 10*3/uL (ref 4.0–10.5)

## 2022-09-26 LAB — COMPREHENSIVE METABOLIC PANEL
ALT: 11 U/L (ref 0–35)
AST: 15 U/L (ref 0–37)
Albumin: 3.9 g/dL (ref 3.5–5.2)
Alkaline Phosphatase: 62 U/L (ref 39–117)
BUN: 12 mg/dL (ref 6–23)
CO2: 30 mEq/L (ref 19–32)
Calcium: 9.3 mg/dL (ref 8.4–10.5)
Chloride: 107 mEq/L (ref 96–112)
Creatinine, Ser: 0.92 mg/dL (ref 0.40–1.20)
GFR: 54.51 mL/min — ABNORMAL LOW (ref >60.00)
Glucose, Bld: 100 mg/dL — ABNORMAL HIGH (ref 70–99)
Potassium: 4.2 mEq/L (ref 3.5–5.1)
Sodium: 145 mEq/L (ref 135–145)
Total Bilirubin: 0.7 mg/dL (ref 0.2–1.2)
Total Protein: 6.4 g/dL (ref 6.0–8.3)

## 2022-09-26 LAB — MAGNESIUM: Magnesium: 1.9 mg/dL (ref 1.5–2.5)

## 2022-09-26 LAB — FERRITIN: Ferritin: 35.7 ng/mL (ref 10.0–291.0)

## 2022-09-26 LAB — SEDIMENTATION RATE: Sed Rate: 7 mm/hr (ref 0–30)

## 2022-09-27 DIAGNOSIS — M1990 Unspecified osteoarthritis, unspecified site: Secondary | ICD-10-CM | POA: Diagnosis not present

## 2022-09-27 DIAGNOSIS — I110 Hypertensive heart disease with heart failure: Secondary | ICD-10-CM | POA: Diagnosis not present

## 2022-09-27 DIAGNOSIS — G629 Polyneuropathy, unspecified: Secondary | ICD-10-CM | POA: Diagnosis not present

## 2022-09-27 DIAGNOSIS — I071 Rheumatic tricuspid insufficiency: Secondary | ICD-10-CM | POA: Diagnosis not present

## 2022-09-27 DIAGNOSIS — Z7952 Long term (current) use of systemic steroids: Secondary | ICD-10-CM | POA: Diagnosis not present

## 2022-09-27 DIAGNOSIS — I5033 Acute on chronic diastolic (congestive) heart failure: Secondary | ICD-10-CM | POA: Diagnosis not present

## 2022-09-27 DIAGNOSIS — M069 Rheumatoid arthritis, unspecified: Secondary | ICD-10-CM | POA: Diagnosis not present

## 2022-09-27 DIAGNOSIS — Z8616 Personal history of COVID-19: Secondary | ICD-10-CM | POA: Diagnosis not present

## 2022-09-27 DIAGNOSIS — I959 Hypotension, unspecified: Secondary | ICD-10-CM | POA: Diagnosis not present

## 2022-09-27 DIAGNOSIS — Z7901 Long term (current) use of anticoagulants: Secondary | ICD-10-CM | POA: Diagnosis not present

## 2022-09-27 DIAGNOSIS — I2510 Atherosclerotic heart disease of native coronary artery without angina pectoris: Secondary | ICD-10-CM | POA: Diagnosis not present

## 2022-09-27 DIAGNOSIS — M810 Age-related osteoporosis without current pathological fracture: Secondary | ICD-10-CM | POA: Diagnosis not present

## 2022-09-27 DIAGNOSIS — I4821 Permanent atrial fibrillation: Secondary | ICD-10-CM | POA: Diagnosis not present

## 2022-09-27 DIAGNOSIS — K219 Gastro-esophageal reflux disease without esophagitis: Secondary | ICD-10-CM | POA: Diagnosis not present

## 2022-09-27 DIAGNOSIS — E785 Hyperlipidemia, unspecified: Secondary | ICD-10-CM | POA: Diagnosis not present

## 2022-09-27 DIAGNOSIS — Z87891 Personal history of nicotine dependence: Secondary | ICD-10-CM | POA: Diagnosis not present

## 2022-09-27 DIAGNOSIS — U071 COVID-19: Secondary | ICD-10-CM | POA: Diagnosis not present

## 2022-09-27 DIAGNOSIS — Z9181 History of falling: Secondary | ICD-10-CM | POA: Diagnosis not present

## 2022-09-27 DIAGNOSIS — M5137 Other intervertebral disc degeneration, lumbosacral region: Secondary | ICD-10-CM | POA: Diagnosis not present

## 2022-09-27 DIAGNOSIS — I739 Peripheral vascular disease, unspecified: Secondary | ICD-10-CM | POA: Diagnosis not present

## 2022-09-27 DIAGNOSIS — Z85828 Personal history of other malignant neoplasm of skin: Secondary | ICD-10-CM | POA: Diagnosis not present

## 2022-09-27 DIAGNOSIS — G2581 Restless legs syndrome: Secondary | ICD-10-CM | POA: Diagnosis not present

## 2022-09-27 NOTE — Telephone Encounter (Signed)
Name of Medication: Tramadol Name of Pharmacy:  [facility] Last Fill or Written Date and Quantity:  11/14/21, #30 Last Office Visit and Type:  09/05/22, HA Next Office Visit and Type:  10/08/22, 3 mo f/u Last Controlled Substance Agreement Date:  none Last UDS:  none

## 2022-09-30 ENCOUNTER — Ambulatory Visit
Admission: RE | Admit: 2022-09-30 | Discharge: 2022-09-30 | Disposition: A | Discharge status: Home / Self Care | Payer: Medicare Other | Source: Ambulatory Visit | Attending: Family Medicine | Admitting: Family Medicine

## 2022-09-30 DIAGNOSIS — R519 Headache, unspecified: Secondary | ICD-10-CM | POA: Insufficient documentation

## 2022-10-01 MED ORDER — TRAMADOL HCL 50 MG PO TABS: 50.0000 mg | ORAL_TABLET | Freq: Every evening | ORAL | 0 refills | Status: DC | PRN

## 2022-10-01 NOTE — Telephone Encounter (Signed)
ERx 

## 2022-10-02 DIAGNOSIS — K219 Gastro-esophageal reflux disease without esophagitis: Secondary | ICD-10-CM | POA: Diagnosis not present

## 2022-10-02 DIAGNOSIS — M5137 Other intervertebral disc degeneration, lumbosacral region: Secondary | ICD-10-CM | POA: Diagnosis not present

## 2022-10-02 DIAGNOSIS — Z85828 Personal history of other malignant neoplasm of skin: Secondary | ICD-10-CM | POA: Diagnosis not present

## 2022-10-02 DIAGNOSIS — I739 Peripheral vascular disease, unspecified: Secondary | ICD-10-CM | POA: Diagnosis not present

## 2022-10-02 DIAGNOSIS — I4821 Permanent atrial fibrillation: Secondary | ICD-10-CM | POA: Diagnosis not present

## 2022-10-02 DIAGNOSIS — G629 Polyneuropathy, unspecified: Secondary | ICD-10-CM | POA: Diagnosis not present

## 2022-10-02 DIAGNOSIS — M810 Age-related osteoporosis without current pathological fracture: Secondary | ICD-10-CM | POA: Diagnosis not present

## 2022-10-02 DIAGNOSIS — Z87891 Personal history of nicotine dependence: Secondary | ICD-10-CM | POA: Diagnosis not present

## 2022-10-02 DIAGNOSIS — Z9181 History of falling: Secondary | ICD-10-CM | POA: Diagnosis not present

## 2022-10-02 DIAGNOSIS — Z7901 Long term (current) use of anticoagulants: Secondary | ICD-10-CM | POA: Diagnosis not present

## 2022-10-02 DIAGNOSIS — I071 Rheumatic tricuspid insufficiency: Secondary | ICD-10-CM | POA: Diagnosis not present

## 2022-10-02 DIAGNOSIS — I110 Hypertensive heart disease with heart failure: Secondary | ICD-10-CM | POA: Diagnosis not present

## 2022-10-02 DIAGNOSIS — I5033 Acute on chronic diastolic (congestive) heart failure: Secondary | ICD-10-CM | POA: Diagnosis not present

## 2022-10-02 DIAGNOSIS — M069 Rheumatoid arthritis, unspecified: Secondary | ICD-10-CM | POA: Diagnosis not present

## 2022-10-02 DIAGNOSIS — I959 Hypotension, unspecified: Secondary | ICD-10-CM | POA: Diagnosis not present

## 2022-10-02 DIAGNOSIS — G2581 Restless legs syndrome: Secondary | ICD-10-CM | POA: Diagnosis not present

## 2022-10-02 DIAGNOSIS — Z8616 Personal history of COVID-19: Secondary | ICD-10-CM | POA: Diagnosis not present

## 2022-10-02 DIAGNOSIS — U071 COVID-19: Secondary | ICD-10-CM | POA: Diagnosis not present

## 2022-10-02 DIAGNOSIS — M1990 Unspecified osteoarthritis, unspecified site: Secondary | ICD-10-CM | POA: Diagnosis not present

## 2022-10-02 DIAGNOSIS — Z7952 Long term (current) use of systemic steroids: Secondary | ICD-10-CM | POA: Diagnosis not present

## 2022-10-02 DIAGNOSIS — E785 Hyperlipidemia, unspecified: Secondary | ICD-10-CM | POA: Diagnosis not present

## 2022-10-02 DIAGNOSIS — I2510 Atherosclerotic heart disease of native coronary artery without angina pectoris: Secondary | ICD-10-CM | POA: Diagnosis not present

## 2022-10-03 DIAGNOSIS — I959 Hypotension, unspecified: Secondary | ICD-10-CM | POA: Diagnosis not present

## 2022-10-03 DIAGNOSIS — Z9181 History of falling: Secondary | ICD-10-CM | POA: Diagnosis not present

## 2022-10-03 DIAGNOSIS — M069 Rheumatoid arthritis, unspecified: Secondary | ICD-10-CM | POA: Diagnosis not present

## 2022-10-03 DIAGNOSIS — I110 Hypertensive heart disease with heart failure: Secondary | ICD-10-CM | POA: Diagnosis not present

## 2022-10-03 DIAGNOSIS — I5033 Acute on chronic diastolic (congestive) heart failure: Secondary | ICD-10-CM | POA: Diagnosis not present

## 2022-10-03 DIAGNOSIS — I739 Peripheral vascular disease, unspecified: Secondary | ICD-10-CM | POA: Diagnosis not present

## 2022-10-03 DIAGNOSIS — M810 Age-related osteoporosis without current pathological fracture: Secondary | ICD-10-CM | POA: Diagnosis not present

## 2022-10-03 DIAGNOSIS — G629 Polyneuropathy, unspecified: Secondary | ICD-10-CM | POA: Diagnosis not present

## 2022-10-03 DIAGNOSIS — K219 Gastro-esophageal reflux disease without esophagitis: Secondary | ICD-10-CM | POA: Diagnosis not present

## 2022-10-03 DIAGNOSIS — Z87891 Personal history of nicotine dependence: Secondary | ICD-10-CM | POA: Diagnosis not present

## 2022-10-03 DIAGNOSIS — I2510 Atherosclerotic heart disease of native coronary artery without angina pectoris: Secondary | ICD-10-CM | POA: Diagnosis not present

## 2022-10-03 DIAGNOSIS — G2581 Restless legs syndrome: Secondary | ICD-10-CM | POA: Diagnosis not present

## 2022-10-03 DIAGNOSIS — Z7952 Long term (current) use of systemic steroids: Secondary | ICD-10-CM | POA: Diagnosis not present

## 2022-10-03 DIAGNOSIS — M5137 Other intervertebral disc degeneration, lumbosacral region: Secondary | ICD-10-CM | POA: Diagnosis not present

## 2022-10-03 DIAGNOSIS — Z7901 Long term (current) use of anticoagulants: Secondary | ICD-10-CM | POA: Diagnosis not present

## 2022-10-03 DIAGNOSIS — Z85828 Personal history of other malignant neoplasm of skin: Secondary | ICD-10-CM | POA: Diagnosis not present

## 2022-10-03 DIAGNOSIS — Z8616 Personal history of COVID-19: Secondary | ICD-10-CM | POA: Diagnosis not present

## 2022-10-03 DIAGNOSIS — I4821 Permanent atrial fibrillation: Secondary | ICD-10-CM | POA: Diagnosis not present

## 2022-10-03 DIAGNOSIS — E785 Hyperlipidemia, unspecified: Secondary | ICD-10-CM | POA: Diagnosis not present

## 2022-10-03 DIAGNOSIS — I071 Rheumatic tricuspid insufficiency: Secondary | ICD-10-CM | POA: Diagnosis not present

## 2022-10-03 DIAGNOSIS — M1990 Unspecified osteoarthritis, unspecified site: Secondary | ICD-10-CM | POA: Diagnosis not present

## 2022-10-03 DIAGNOSIS — U071 COVID-19: Secondary | ICD-10-CM | POA: Diagnosis not present

## 2022-10-07 DIAGNOSIS — K219 Gastro-esophageal reflux disease without esophagitis: Secondary | ICD-10-CM | POA: Diagnosis not present

## 2022-10-07 DIAGNOSIS — I5033 Acute on chronic diastolic (congestive) heart failure: Secondary | ICD-10-CM | POA: Diagnosis not present

## 2022-10-07 DIAGNOSIS — G629 Polyneuropathy, unspecified: Secondary | ICD-10-CM | POA: Diagnosis not present

## 2022-10-07 DIAGNOSIS — I071 Rheumatic tricuspid insufficiency: Secondary | ICD-10-CM | POA: Diagnosis not present

## 2022-10-07 DIAGNOSIS — Z9181 History of falling: Secondary | ICD-10-CM | POA: Diagnosis not present

## 2022-10-07 DIAGNOSIS — I739 Peripheral vascular disease, unspecified: Secondary | ICD-10-CM | POA: Diagnosis not present

## 2022-10-07 DIAGNOSIS — G2581 Restless legs syndrome: Secondary | ICD-10-CM | POA: Diagnosis not present

## 2022-10-07 DIAGNOSIS — M069 Rheumatoid arthritis, unspecified: Secondary | ICD-10-CM | POA: Diagnosis not present

## 2022-10-07 DIAGNOSIS — M5137 Other intervertebral disc degeneration, lumbosacral region: Secondary | ICD-10-CM | POA: Diagnosis not present

## 2022-10-07 DIAGNOSIS — Z85828 Personal history of other malignant neoplasm of skin: Secondary | ICD-10-CM | POA: Diagnosis not present

## 2022-10-07 DIAGNOSIS — U071 COVID-19: Secondary | ICD-10-CM | POA: Diagnosis not present

## 2022-10-07 DIAGNOSIS — I110 Hypertensive heart disease with heart failure: Secondary | ICD-10-CM | POA: Diagnosis not present

## 2022-10-07 DIAGNOSIS — Z87891 Personal history of nicotine dependence: Secondary | ICD-10-CM | POA: Diagnosis not present

## 2022-10-07 DIAGNOSIS — I4821 Permanent atrial fibrillation: Secondary | ICD-10-CM | POA: Diagnosis not present

## 2022-10-07 DIAGNOSIS — Z7901 Long term (current) use of anticoagulants: Secondary | ICD-10-CM | POA: Diagnosis not present

## 2022-10-07 DIAGNOSIS — M1990 Unspecified osteoarthritis, unspecified site: Secondary | ICD-10-CM | POA: Diagnosis not present

## 2022-10-07 DIAGNOSIS — M810 Age-related osteoporosis without current pathological fracture: Secondary | ICD-10-CM | POA: Diagnosis not present

## 2022-10-07 DIAGNOSIS — Z8616 Personal history of COVID-19: Secondary | ICD-10-CM | POA: Diagnosis not present

## 2022-10-07 DIAGNOSIS — E785 Hyperlipidemia, unspecified: Secondary | ICD-10-CM | POA: Diagnosis not present

## 2022-10-07 DIAGNOSIS — Z7952 Long term (current) use of systemic steroids: Secondary | ICD-10-CM | POA: Diagnosis not present

## 2022-10-07 DIAGNOSIS — I959 Hypotension, unspecified: Secondary | ICD-10-CM | POA: Diagnosis not present

## 2022-10-07 DIAGNOSIS — I2510 Atherosclerotic heart disease of native coronary artery without angina pectoris: Secondary | ICD-10-CM | POA: Diagnosis not present

## 2022-10-08 ENCOUNTER — Ambulatory Visit (INDEPENDENT_AMBULATORY_CARE_PROVIDER_SITE_OTHER): Payer: Medicare Other | Admitting: Family Medicine

## 2022-10-08 ENCOUNTER — Encounter: Payer: Self-pay | Admitting: Family Medicine

## 2022-10-08 VITALS — BP 132/82 | HR 75 | Temp 97.6°F | Ht 62.0 in | Wt 140.1 lb

## 2022-10-08 DIAGNOSIS — M25512 Pain in left shoulder: Secondary | ICD-10-CM | POA: Diagnosis not present

## 2022-10-08 DIAGNOSIS — G4700 Insomnia, unspecified: Secondary | ICD-10-CM | POA: Diagnosis not present

## 2022-10-08 DIAGNOSIS — R5383 Other fatigue: Secondary | ICD-10-CM | POA: Diagnosis not present

## 2022-10-08 DIAGNOSIS — R519 Headache, unspecified: Secondary | ICD-10-CM | POA: Diagnosis not present

## 2022-10-08 DIAGNOSIS — G2581 Restless legs syndrome: Secondary | ICD-10-CM | POA: Diagnosis not present

## 2022-10-08 MED ORDER — IRON (FERROUS SULFATE) 325 (65 FE) MG PO TABS: 325.0000 mg | ORAL_TABLET | ORAL | Status: DC

## 2022-10-08 MED ORDER — ROPINIROLE HCL 2 MG PO TABS: 2.0000 mg | ORAL_TABLET | Freq: Two times a day (BID) | ORAL | 6 refills | Status: DC

## 2022-10-08 MED ORDER — GABAPENTIN 300 MG PO CAPS: 300.0000 mg | ORAL_CAPSULE | Freq: Two times a day (BID) | ORAL | 6 refills | Status: DC

## 2022-10-08 NOTE — Assessment & Plan Note (Signed)
Ongoing sensation. Recent head CT and labwork reassuring.  Today has discomfort to palpation at R paracervical mm and R occipital region - ?MSK vs occ neuralgia. Will try increased gabapentin from 400mg  nightly to 300mg  BID in afternoon/evening, discussed trial tizanidine 2mg  at night and monitor effect on symptoms.

## 2022-10-08 NOTE — Assessment & Plan Note (Signed)
Significant improvement in L shoulder pain after subacromial injection by PMR

## 2022-10-08 NOTE — Assessment & Plan Note (Signed)
Continues requip 2.5mg  2-3 hours prior to bedtime. Concern for augmentation - worsened symptoms, earlier in the day.  Will increase Requip to 2mg  BID early afternoon and again at 7pm dosing. Will also increase gabapentin as per below - 300mg  BID afternoon and evening (currently on 400mg  nightly). Update with effect.  Will also start oral OTC iron tablet twice weekly.  Reassess at f/u visit.

## 2022-10-08 NOTE — Patient Instructions (Addendum)
Add oral iron ferrous sulfate 325mg  (65 FE) 1-2 times weekly  Change requip to 2mg  in early afternoon and repeat 2mg  2-3 hours before bedtime. New dose sent to pharmacy.  May change gabapentin to 300mg  twice daily as well.  New gabapentin dose sent to pharmacy.  May try tizanidine or tramadol (not both) for neck/head pain/discomfort.  Return in 2-3 months for follow up visit

## 2022-10-08 NOTE — Progress Notes (Signed)
Ph: [phone] Fax: [phone]   Patient ID: [name], female    DOB: Mar 30, 1930, 89 y.o.   MRN: [id number]  This visit was conducted in person.  BP 132/82   Pulse 75   Temp 97.6 F (36.4 C) (Temporal)   Ht 5\' 2"  (1.575 m)   Wt 140 lb 2 oz (63.6 kg)   SpO2 96%   BMI 25.63 kg/m    CC: 3 mo DM f/u visit  Subjective:   HPI: [name] is a 89 y.o. female presenting on 10/08/2022 for Medical Management of Chronic Issues (Here for 3 mo f/u. Pt requests a [place] [employer]ing placard. Pt accompanied by daughter, [name]. )   See prior note for details.  She requests handicap placard today.  Latest saw PM&R 6/20, note reviewed. She received L subacromial injection with some benefit. Acute on chronic low back pain with radiation to bilateral buttock, groin. Managed with tylenol 1000mg  BID, tizanidine 2mg  nightly PRN, tramadol 50mg  1-2 tab at bedtime PRN.   Most recently referred to [facility]) for strengthening gait and balance training - planned weekly x 9 wks.   Ongoing anxiety, agitation, restlessness worse at night - recently started melatonin 5mg  nightly for sleep and changed tylenol PM to plain tylenol. She is sleeping better.   RLS - continues requip 2.5mg  nightly. Takes 2-3 hours prior to bedtime - this has improved symptoms. Now having restless leg symptoms during the day.   She continues to feel R parieto-occipital pressure sensation. Describes heavy feeling that hurts with palpation. Denies inciting trauma/injury.  She continues to feel tired.      Relevant past medical, surgical, family and social history reviewed and updated as indicated. Interim medical history since our last visit reviewed. Allergies and medications reviewed and updated. Outpatient Medications Prior to Visit  Medication Sig Dispense Refill   acetaminophen (TYLENOL) 500 MG tablet Take 500 mg by mouth every 6 (six) hours as needed for mild pain.     atorvastatin  (LIPITOR) 20 MG tablet Take 1 tablet (20 mg total) by mouth daily. 90 tablet 3   Docusate Calcium (STOOL SOFTENER PO) Take 1 capsule by mouth daily.     DULoxetine (CYMBALTA) 30 MG capsule Take 1 capsule (30 mg total) by mouth daily. For mood and neuropathy 30 capsule 6   ELIQUIS 5 MG TABS tablet Take 1 tablet by mouth twice daily 180 tablet 1   furosemide (LASIX) 20 MG tablet Take 1 tablet (20 mg total) by mouth daily. Takes M/W/F     lisinopril (ZESTRIL) 10 MG tablet Take 1 tablet (10 mg total) by mouth daily. 90 tablet 1   memantine (NAMENDA) 5 MG tablet Take 1 tablet (5 mg total) by mouth 2 (two) times daily. 60 tablet 6   metoprolol succinate (TOPROL-XL) 100 MG 24 hr tablet Take 1 tablet (100 mg total) by mouth 2 (two) times daily. Please call the office to schedule follow up prior to further refills. 180 tablet 0   pantoprazole (PROTONIX) 40 MG tablet Take 1 tablet (40 mg total) by mouth daily. 90 tablet 3   polyethylene glycol (MIRALAX / GLYCOLAX) 17 g packet Take 17 g by mouth daily as needed for mild constipation. 14 each 0   Potassium 99 MG TABS Take 1 tablet (99 mg total) by mouth daily as needed (with lasix).     prednisoLONE acetate (PRED FORTE) 1 % ophthalmic suspension Place 1 drop into the left eye daily.  traMADol (ULTRAM) 50 MG tablet Take 1-2 tablets (50-100 mg total) by mouth at bedtime as needed. 30 tablet 0   Vitamin D, Ergocalciferol, (DRISDOL) 1.25 MG (50000 UNIT) CAPS capsule TAKE 1 CAPSULE BY MOUTH ONCE WEEKLY (EVERY 7 DAYS) 12 capsule 0   gabapentin (NEURONTIN) 400 MG capsule Take 1 capsule (400 mg total) by mouth at bedtime.     rOPINIRole (REQUIP) 1 MG tablet Take 2 mg by mouth at bedtime. Takes 2.5 tablets at bedtime     No facility-administered medications prior to visit.     Per HPI unless specifically indicated in ROS section below Review of Systems  Objective:  BP 132/82   Pulse 75   Temp 97.6 F (36.4 C) (Temporal)   Ht 5\' 2"  (1.575 m)   Wt 140 lb 2  oz (63.6 kg)   SpO2 96%   BMI 25.63 kg/m   Wt Readings from Last 3 Encounters:  10/08/22 140 lb 2 oz (63.6 kg)  09/05/22 142 lb 4 oz (64.5 kg)  07/16/22 138 lb (62.6 kg)      Physical Exam Vitals and nursing note reviewed.  Constitutional:      Appearance: Normal appearance. She is not ill-appearing.  HENT:     Head: Normocephalic and atraumatic.     Mouth/Throat:     Mouth: Mucous membranes are moist.     Pharynx: Oropharynx is clear. No oropharyngeal exudate or posterior oropharyngeal erythema.  Eyes:     Pupils: Pupils are equal, round, and reactive to light.  Neck:     Comments:  No midline cervical spine tenderness FROM  + R paracervical mm tenderness to palpation as well as tender to palpation at R occiput  Cardiovascular:     Rate and Rhythm: Normal rate and regular rhythm.     Pulses: Normal pulses.     Heart sounds: Normal heart sounds. No murmur heard. Pulmonary:     Effort: Pulmonary effort is normal. No respiratory distress.     Breath sounds: Normal breath sounds. No wheezing, rhonchi or rales.  Musculoskeletal:     Cervical back: Normal range of motion and neck supple. Tenderness present.  Neurological:     Mental Status: She is alert.  Psychiatric:        Mood and Affect: Mood normal.        Behavior: Behavior normal.       Results for orders placed or performed in visit on 09/26/22  Magnesium  Result Value Ref Range   Magnesium 1.9 1.5 - 2.5 mg/dL  Sedimentation rate  Result Value Ref Range   Sed Rate 7 0 - 30 mm/hr  CBC with Differential/Platelet  Result Value Ref Range   WBC 6.5 4.0 - 10.5 K/uL   RBC 4.23 3.87 - 5.11 Mil/uL   Hemoglobin 12.8 12.0 - 15.0 g/dL   HCT 81.1 91.4 - 78.2 %   MCV 93.7 78.0 - 100.0 fl   MCHC 32.5 30.0 - 36.0 g/dL   RDW 95.6 21.3 - 08.6 %   Platelets 263.0 150.0 - 400.0 K/uL   Neutrophils Relative % 53.4 43.0 - 77.0 %   Lymphocytes Relative 33.7 12.0 - 46.0 %   Monocytes Relative 8.6 3.0 - 12.0 %   Eosinophils  Relative 2.5 0.0 - 5.0 %   Basophils Relative 1.8 0.0 - 3.0 %   Neutro Abs 3.5 1.4 - 7.7 K/uL   Lymphs Abs 2.2 0.7 - 4.0 K/uL   Monocytes Absolute 0.6 0.1 - 1.0 K/uL  Eosinophils Absolute 0.2 0.0 - 0.7 K/uL   Basophils Absolute 0.1 0.0 - 0.1 K/uL  Comprehensive metabolic panel  Result Value Ref Range   Sodium 145 135 - 145 mEq/L   Potassium 4.2 3.5 - 5.1 mEq/L   Chloride 107 96 - 112 mEq/L   CO2 30 19 - 32 mEq/L   Glucose, Bld 100 (H) 70 - 99 mg/dL   BUN 12 6 - 23 mg/dL   Creatinine, Ser 1.61 0.40 - 1.20 mg/dL   Total Bilirubin 0.7 0.2 - 1.2 mg/dL   Alkaline Phosphatase 62 39 - 117 U/L   AST 15 0 - 37 U/L   ALT 11 0 - 35 U/L   Total Protein 6.4 6.0 - 8.3 g/dL   Albumin 3.9 3.5 - 5.2 g/dL   GFR 09.60 (L) >45.40 mL/min   Calcium 9.3 8.4 - 10.5 mg/dL  IBC panel  Result Value Ref Range   Iron 65 42 - 145 ug/dL   Transferrin 981.1 914.7 - 360.0 mg/dL   Saturation Ratios 82.9 (L) 20.0 - 50.0 %   TIBC 358.4 250.0 - 450.0 mcg/dL  Ferritin  Result Value Ref Range   Ferritin 35.7 10.0 - 291.0 ng/mL   *Note: Due to a large number of results and/or encounters for the requested time period, some results have not been displayed. A complete set of results can be found in Results Review.   CT HEAD WO CONTRAST ( ) CLINICAL DATA:  Headache, new onset (Age >= 51y) head pressure to right parietal area  EXAM: CT HEAD WITHOUT CONTRAST  TECHNIQUE: Contiguous axial images were obtained from the base of the skull through the vertex without intravenous contrast.  RADIATION DOSE REDUCTION: This exam was performed according to the departmental dose-optimization program which includes automated exposure control, adjustment of the mA and/or kV according to patient size and/or use of iterative reconstruction technique.  COMPARISON:  CT head 10/25/2020  FINDINGS: Brain:  Cerebral ventricle sizes are concordant with the degree of cerebral volume loss. Patchy and confluent areas of  decreased attenuation are noted throughout the deep and periventricular white matter of the cerebral hemispheres bilaterally, compatible with chronic microvascular ischemic disease. no evidence of large-territorial acute infarction. No parenchymal hemorrhage. No mass lesion. No extra-axial collection.  No mass effect or midline shift. No hydrocephalus. Basilar cisterns are patent.  Vascular: No hyperdense vessel. Atherosclerotic calcifications are present within the cavernous internal carotid and vertebral arteries.  Skull: No acute fracture or focal lesion.  Sinuses/Orbits: Paranasal sinuses and mastoid air cells are clear. Bilateral lens replacement. Otherwise the orbits are unremarkable.  Other: None.  IMPRESSION: No acute intracranial abnormality.  Electronically Signed   By: Tish Frederickson M.D.   On: 10/01/2022 20:48  Assessment & Plan:   Problem List Items Addressed This Visit     RLS (restless legs syndrome) - Primary    Continues requip 2.5mg  2-3 hours prior to bedtime. Concern for augmentation - worsened symptoms, earlier in the day.  Will increase Requip to 2mg  BID early afternoon and again at 7pm dosing. Will also increase gabapentin as per below - 300mg  BID afternoon and evening (currently on 400mg  nightly). Update with effect.  Will also start oral OTC iron tablet twice weekly.  Reassess at f/u visit.       Fatigue    Ongoing difficulty. See below.       Insomnia    Doing better on melatonin and off tylenol PM - continue      Pressure  in head    Ongoing sensation. Recent head CT and labwork reassuring.  Today has discomfort to palpation at R paracervical mm and R occipital region - ?MSK vs occ neuralgia. Will try increased gabapentin from 400mg  nightly to 300mg  BID in afternoon/evening, discussed trial tizanidine 2mg  at night and monitor effect on symptoms.       Relevant Medications   gabapentin (NEURONTIN) 300 MG capsule   Left shoulder pain     Significant improvement in L shoulder pain after subacromial injection by PMR        Meds ordered this encounter  Medications   Iron, Ferrous Sulfate, 325 (65 Fe) MG TABS    Sig: Take 325 mg by mouth 2 (two) times a week.   gabapentin (NEURONTIN) 300 MG capsule    Sig: Take 1 capsule (300 mg total) by mouth 2 (two) times daily.    Dispense:  60 capsule    Refill:  6   rOPINIRole (REQUIP) 2 MG tablet    Sig: Take 1 tablet (2 mg total) by mouth in the morning and at bedtime.    Dispense:  60 tablet    Refill:  6    No orders of the defined types were placed in this encounter.   Patient Instructions  Add oral iron ferrous sulfate 325mg  (65 FE) 1-2 times weekly  Change requip to 2mg  in early afternoon and repeat 2mg  2-3 hours before bedtime. New dose sent to pharmacy.  May change gabapentin to 300mg  twice daily as well.  New gabapentin dose sent to pharmacy.  May try tizanidine or tramadol (not both) for neck/head pain/discomfort.  Return in 2-3 months for follow up visit   Follow up plan: Return in about 3 months (around 01/08/2023) for follow up visit.  Eustaquio Boyden, MD

## 2022-10-08 NOTE — Assessment & Plan Note (Signed)
Doing better on melatonin and off tylenol PM - continue

## 2022-10-08 NOTE — Assessment & Plan Note (Signed)
Ongoing difficulty. See below.

## 2022-10-14 ENCOUNTER — Telehealth: Payer: Self-pay | Admitting: Family Medicine

## 2022-10-14 NOTE — Telephone Encounter (Signed)
Agree with this. Thanks.  

## 2022-10-14 NOTE — Telephone Encounter (Signed)
Lvm (on secured vm, per recording) informing [facility] Dr. Reece Agar is giving verbal orders for services requested.

## 2022-10-14 NOTE — Telephone Encounter (Signed)
Home Health verbal orders Caller Name:[place] Agency Name: Girtha Hake  Callback number: [phone]  Requesting OT/PT/Skilled nursing/Social Work/Speech: PT  Reason: Progression of balance and strengthening  Frequency:1 WK 9  Please forward to [facility] pool or providers CMA

## 2022-10-15 DIAGNOSIS — Z961 Presence of intraocular lens: Secondary | ICD-10-CM | POA: Diagnosis not present

## 2022-10-15 DIAGNOSIS — B0233 Zoster keratitis: Secondary | ICD-10-CM | POA: Diagnosis not present

## 2022-10-17 DIAGNOSIS — Z9181 History of falling: Secondary | ICD-10-CM | POA: Diagnosis not present

## 2022-10-17 DIAGNOSIS — I4821 Permanent atrial fibrillation: Secondary | ICD-10-CM | POA: Diagnosis not present

## 2022-10-17 DIAGNOSIS — K219 Gastro-esophageal reflux disease without esophagitis: Secondary | ICD-10-CM | POA: Diagnosis not present

## 2022-10-17 DIAGNOSIS — M069 Rheumatoid arthritis, unspecified: Secondary | ICD-10-CM | POA: Diagnosis not present

## 2022-10-17 DIAGNOSIS — I959 Hypotension, unspecified: Secondary | ICD-10-CM | POA: Diagnosis not present

## 2022-10-17 DIAGNOSIS — I110 Hypertensive heart disease with heart failure: Secondary | ICD-10-CM | POA: Diagnosis not present

## 2022-10-17 DIAGNOSIS — E785 Hyperlipidemia, unspecified: Secondary | ICD-10-CM | POA: Diagnosis not present

## 2022-10-17 DIAGNOSIS — Z7901 Long term (current) use of anticoagulants: Secondary | ICD-10-CM | POA: Diagnosis not present

## 2022-10-17 DIAGNOSIS — I071 Rheumatic tricuspid insufficiency: Secondary | ICD-10-CM | POA: Diagnosis not present

## 2022-10-17 DIAGNOSIS — M810 Age-related osteoporosis without current pathological fracture: Secondary | ICD-10-CM | POA: Diagnosis not present

## 2022-10-17 DIAGNOSIS — Z87891 Personal history of nicotine dependence: Secondary | ICD-10-CM | POA: Diagnosis not present

## 2022-10-17 DIAGNOSIS — I2510 Atherosclerotic heart disease of native coronary artery without angina pectoris: Secondary | ICD-10-CM | POA: Diagnosis not present

## 2022-10-17 DIAGNOSIS — I739 Peripheral vascular disease, unspecified: Secondary | ICD-10-CM | POA: Diagnosis not present

## 2022-10-17 DIAGNOSIS — G2581 Restless legs syndrome: Secondary | ICD-10-CM | POA: Diagnosis not present

## 2022-10-17 DIAGNOSIS — I5033 Acute on chronic diastolic (congestive) heart failure: Secondary | ICD-10-CM | POA: Diagnosis not present

## 2022-10-17 DIAGNOSIS — M5137 Other intervertebral disc degeneration, lumbosacral region: Secondary | ICD-10-CM | POA: Diagnosis not present

## 2022-10-17 DIAGNOSIS — G629 Polyneuropathy, unspecified: Secondary | ICD-10-CM | POA: Diagnosis not present

## 2022-10-17 DIAGNOSIS — Z8616 Personal history of COVID-19: Secondary | ICD-10-CM | POA: Diagnosis not present

## 2022-10-17 DIAGNOSIS — Z7952 Long term (current) use of systemic steroids: Secondary | ICD-10-CM | POA: Diagnosis not present

## 2022-10-17 DIAGNOSIS — Z85828 Personal history of other malignant neoplasm of skin: Secondary | ICD-10-CM | POA: Diagnosis not present

## 2022-10-17 DIAGNOSIS — M1990 Unspecified osteoarthritis, unspecified site: Secondary | ICD-10-CM | POA: Diagnosis not present

## 2022-10-21 ENCOUNTER — Telehealth: Payer: Self-pay | Admitting: Family Medicine

## 2022-10-21 DIAGNOSIS — I4821 Permanent atrial fibrillation: Secondary | ICD-10-CM | POA: Diagnosis not present

## 2022-10-21 DIAGNOSIS — Z87891 Personal history of nicotine dependence: Secondary | ICD-10-CM | POA: Diagnosis not present

## 2022-10-21 DIAGNOSIS — M1990 Unspecified osteoarthritis, unspecified site: Secondary | ICD-10-CM | POA: Diagnosis not present

## 2022-10-21 DIAGNOSIS — Z7952 Long term (current) use of systemic steroids: Secondary | ICD-10-CM | POA: Diagnosis not present

## 2022-10-21 DIAGNOSIS — Z7901 Long term (current) use of anticoagulants: Secondary | ICD-10-CM | POA: Diagnosis not present

## 2022-10-21 DIAGNOSIS — I2510 Atherosclerotic heart disease of native coronary artery without angina pectoris: Secondary | ICD-10-CM | POA: Diagnosis not present

## 2022-10-21 DIAGNOSIS — I959 Hypotension, unspecified: Secondary | ICD-10-CM | POA: Diagnosis not present

## 2022-10-21 DIAGNOSIS — G629 Polyneuropathy, unspecified: Secondary | ICD-10-CM | POA: Diagnosis not present

## 2022-10-21 DIAGNOSIS — G2581 Restless legs syndrome: Secondary | ICD-10-CM | POA: Diagnosis not present

## 2022-10-21 DIAGNOSIS — I739 Peripheral vascular disease, unspecified: Secondary | ICD-10-CM | POA: Diagnosis not present

## 2022-10-21 DIAGNOSIS — I110 Hypertensive heart disease with heart failure: Secondary | ICD-10-CM | POA: Diagnosis not present

## 2022-10-21 DIAGNOSIS — E785 Hyperlipidemia, unspecified: Secondary | ICD-10-CM | POA: Diagnosis not present

## 2022-10-21 DIAGNOSIS — M810 Age-related osteoporosis without current pathological fracture: Secondary | ICD-10-CM | POA: Diagnosis not present

## 2022-10-21 DIAGNOSIS — I071 Rheumatic tricuspid insufficiency: Secondary | ICD-10-CM | POA: Diagnosis not present

## 2022-10-21 DIAGNOSIS — Z85828 Personal history of other malignant neoplasm of skin: Secondary | ICD-10-CM | POA: Diagnosis not present

## 2022-10-21 DIAGNOSIS — Z8616 Personal history of COVID-19: Secondary | ICD-10-CM | POA: Diagnosis not present

## 2022-10-21 DIAGNOSIS — I5033 Acute on chronic diastolic (congestive) heart failure: Secondary | ICD-10-CM | POA: Diagnosis not present

## 2022-10-21 DIAGNOSIS — M5137 Other intervertebral disc degeneration, lumbosacral region: Secondary | ICD-10-CM | POA: Diagnosis not present

## 2022-10-21 DIAGNOSIS — G8929 Other chronic pain: Secondary | ICD-10-CM

## 2022-10-21 DIAGNOSIS — Z9181 History of falling: Secondary | ICD-10-CM | POA: Diagnosis not present

## 2022-10-21 DIAGNOSIS — K219 Gastro-esophageal reflux disease without esophagitis: Secondary | ICD-10-CM | POA: Diagnosis not present

## 2022-10-21 DIAGNOSIS — M069 Rheumatoid arthritis, unspecified: Secondary | ICD-10-CM | POA: Diagnosis not present

## 2022-10-21 NOTE — Telephone Encounter (Signed)
Rtn pt's daughter, [name] (on dpr), call. She noticed potassium on pt's med list and is asking if should be taking potassium. States it wasn't discussed at last OV. Plz advise.

## 2022-10-21 NOTE — Telephone Encounter (Signed)
Patient's daughter called to leave a message and request a call back whenever possible. Patient's daughter can be reached at [phone].

## 2022-10-22 NOTE — Telephone Encounter (Addendum)
She received steroid shot into L shoulder for impingement syndrome by PM&R ([facility]) on 09/26/2022.  L shoulder xrays ordered.

## 2022-10-22 NOTE — Telephone Encounter (Addendum)
Spoke with [name] (on dpr) relaying Dr. Timoteo Expose message. Verbalizes understanding.   Also, [name] states pt still c/o L shoulder pain. Per 09/05/22 OV notes, pt was told to come in for x-ray if pain continues. Scheduled pt for L shoulder x-ray on 10/28/22 at 3:00. Plz place order.

## 2022-10-22 NOTE — Telephone Encounter (Signed)
Since she's taking lasix 20mg  daily, would suggest take OTC potassium 99mg  at least MWF.

## 2022-10-22 NOTE — Addendum Note (Signed)
Addended by: Eustaquio Boyden on: 10/22/2022 01:50 PM   Modules accepted: Orders

## 2022-10-28 ENCOUNTER — Other Ambulatory Visit: Payer: Medicare Other

## 2022-11-05 ENCOUNTER — Other Ambulatory Visit: Payer: Self-pay | Admitting: Family Medicine

## 2022-11-05 DIAGNOSIS — K219 Gastro-esophageal reflux disease without esophagitis: Secondary | ICD-10-CM

## 2022-11-06 ENCOUNTER — Encounter (INDEPENDENT_AMBULATORY_CARE_PROVIDER_SITE_OTHER): Payer: Self-pay

## 2022-11-20 ENCOUNTER — Ambulatory Visit: Payer: Medicare Other | Admitting: Nurse Practitioner

## 2022-11-22 DIAGNOSIS — M1990 Unspecified osteoarthritis, unspecified site: Secondary | ICD-10-CM

## 2022-11-22 DIAGNOSIS — I4821 Permanent atrial fibrillation: Secondary | ICD-10-CM | POA: Diagnosis not present

## 2022-11-22 DIAGNOSIS — I959 Hypotension, unspecified: Secondary | ICD-10-CM | POA: Diagnosis not present

## 2022-11-22 DIAGNOSIS — Z7952 Long term (current) use of systemic steroids: Secondary | ICD-10-CM

## 2022-11-22 DIAGNOSIS — I739 Peripheral vascular disease, unspecified: Secondary | ICD-10-CM | POA: Diagnosis not present

## 2022-11-22 DIAGNOSIS — F419 Anxiety disorder, unspecified: Secondary | ICD-10-CM

## 2022-11-22 DIAGNOSIS — Z85828 Personal history of other malignant neoplasm of skin: Secondary | ICD-10-CM

## 2022-11-22 DIAGNOSIS — G2581 Restless legs syndrome: Secondary | ICD-10-CM | POA: Diagnosis not present

## 2022-11-22 DIAGNOSIS — Z87891 Personal history of nicotine dependence: Secondary | ICD-10-CM

## 2022-11-22 DIAGNOSIS — I071 Rheumatic tricuspid insufficiency: Secondary | ICD-10-CM | POA: Diagnosis not present

## 2022-11-22 DIAGNOSIS — I110 Hypertensive heart disease with heart failure: Secondary | ICD-10-CM | POA: Diagnosis not present

## 2022-11-22 DIAGNOSIS — M5137 Other intervertebral disc degeneration, lumbosacral region: Secondary | ICD-10-CM

## 2022-11-22 DIAGNOSIS — E785 Hyperlipidemia, unspecified: Secondary | ICD-10-CM | POA: Diagnosis not present

## 2022-11-22 DIAGNOSIS — Z8616 Personal history of COVID-19: Secondary | ICD-10-CM

## 2022-11-22 DIAGNOSIS — K219 Gastro-esophageal reflux disease without esophagitis: Secondary | ICD-10-CM | POA: Diagnosis not present

## 2022-11-22 DIAGNOSIS — I5033 Acute on chronic diastolic (congestive) heart failure: Secondary | ICD-10-CM | POA: Diagnosis not present

## 2022-11-22 DIAGNOSIS — G629 Polyneuropathy, unspecified: Secondary | ICD-10-CM | POA: Diagnosis not present

## 2022-11-22 DIAGNOSIS — Z9181 History of falling: Secondary | ICD-10-CM

## 2022-11-22 DIAGNOSIS — M069 Rheumatoid arthritis, unspecified: Secondary | ICD-10-CM | POA: Diagnosis not present

## 2022-11-22 DIAGNOSIS — M810 Age-related osteoporosis without current pathological fracture: Secondary | ICD-10-CM

## 2022-11-22 DIAGNOSIS — I2510 Atherosclerotic heart disease of native coronary artery without angina pectoris: Secondary | ICD-10-CM | POA: Diagnosis not present

## 2022-11-22 DIAGNOSIS — Z7901 Long term (current) use of anticoagulants: Secondary | ICD-10-CM

## 2022-11-26 ENCOUNTER — Other Ambulatory Visit: Payer: Self-pay | Admitting: Family Medicine

## 2022-11-26 NOTE — Progress Notes (Signed)
This encounter was created in error - please disregard. D[place] is at the beach and not able to keep appointment today. Daughter stated she will call back once she returns to town to reschedule AWV.

## 2022-12-07 ENCOUNTER — Other Ambulatory Visit: Payer: Self-pay | Admitting: Family Medicine

## 2022-12-07 ENCOUNTER — Other Ambulatory Visit: Payer: Self-pay | Admitting: Cardiovascular Disease

## 2022-12-07 DIAGNOSIS — E559 Vitamin D deficiency, unspecified: Secondary | ICD-10-CM

## 2022-12-11 NOTE — Telephone Encounter (Addendum)
E-scribed refill.    Disregard. Nothing else needed.

## 2023-01-14 ENCOUNTER — Encounter (INDEPENDENT_AMBULATORY_CARE_PROVIDER_SITE_OTHER): Payer: Self-pay | Admitting: Vascular Surgery

## 2023-01-14 ENCOUNTER — Ambulatory Visit (INDEPENDENT_AMBULATORY_CARE_PROVIDER_SITE_OTHER): Payer: Medicare Other

## 2023-01-14 ENCOUNTER — Ambulatory Visit (INDEPENDENT_AMBULATORY_CARE_PROVIDER_SITE_OTHER): Payer: Medicare Other | Admitting: Vascular Surgery

## 2023-01-14 VITALS — BP 150/80 | HR 76 | Resp 16

## 2023-01-14 DIAGNOSIS — I739 Peripheral vascular disease, unspecified: Secondary | ICD-10-CM

## 2023-01-14 DIAGNOSIS — N1831 Chronic kidney disease, stage 3a: Secondary | ICD-10-CM | POA: Diagnosis not present

## 2023-01-14 DIAGNOSIS — E785 Hyperlipidemia, unspecified: Secondary | ICD-10-CM | POA: Diagnosis not present

## 2023-01-14 DIAGNOSIS — I4819 Other persistent atrial fibrillation: Secondary | ICD-10-CM

## 2023-01-14 DIAGNOSIS — I10 Essential (primary) hypertension: Secondary | ICD-10-CM

## 2023-01-14 NOTE — Progress Notes (Signed)
MRN : [id number]  [name] is a 89 y.o. (1929-12-23) female who presents with chief complaint of  Chief Complaint  Patient presents with   Follow-up    6 month abi  .  History of Present Illness: Patient returns today in follow up of her peripheral arterial disease.  She has been doing fairly well.  No new ulceration, infection, or symptoms of ischemic rest pain.  Really does not ambulate long enough distances to have claudication.  Seems in her usual state of health. ABIs today are 0.98 on the right and 1.08 on the left with triphasic waveforms.  Digit pressures are reduced  Current Outpatient Medications  Medication Sig Dispense Refill   acetaminophen (TYLENOL) 500 MG tablet Take 500 mg by mouth every 6 (six) hours as needed for mild pain.     atorvastatin (LIPITOR) 20 MG tablet TAKE ONE TABLET BY MOUTH ONCE DAILY 90 tablet 0   Docusate Calcium (STOOL SOFTENER PO) Take 1 capsule by mouth daily.     DULoxetine (CYMBALTA) 30 MG capsule Take 1 capsule (30 mg total) by mouth daily. For mood and neuropathy 30 capsule 6   ELIQUIS 5 MG TABS tablet Take 1 tablet by mouth twice daily 180 tablet 1   furosemide (LASIX) 20 MG tablet Take 1 tablet (20 mg total) by mouth daily. Takes M/W/F     gabapentin (NEURONTIN) 300 MG capsule Take 1 capsule (300 mg total) by mouth 2 (two) times daily. 60 capsule 6   Iron, Ferrous Sulfate, 325 (65 Fe) MG TABS Take 325 mg by mouth 2 (two) times a week.     lisinopril (ZESTRIL) 10 MG tablet Take 1 tablet (10 mg total) by mouth daily. 90 tablet 1   memantine (NAMENDA) 5 MG tablet Take 1 tablet (5 mg total) by mouth 2 (two) times daily. 60 tablet 6   metoprolol succinate (TOPROL-XL) 100 MG 24 hr tablet Take 1 tablet (100 mg total) by mouth in the morning and at bedtime. 180 tablet 0   pantoprazole (PROTONIX) 40 MG tablet TAKE 1 TABLET BY MOUTH ONCE DAILY 90 tablet 1   polyethylene glycol (MIRALAX / GLYCOLAX) 17 g packet Take 17 g by mouth daily as needed for  mild constipation. 14 each 0   Potassium 99 MG TABS Take 1 tablet (99 mg total) by mouth daily as needed (with lasix).     prednisoLONE acetate (PRED FORTE) 1 % ophthalmic suspension Place 1 drop into the left eye daily.      rOPINIRole (REQUIP) 2 MG tablet Take 1 tablet (2 mg total) by mouth in the morning and at bedtime. 60 tablet 6   traMADol (ULTRAM) 50 MG tablet Take 1-2 tablets (50-100 mg total) by mouth at bedtime as needed. 30 tablet 0   Vitamin D, Ergocalciferol, (DRISDOL) 1.25 MG (50000 UNIT) CAPS capsule TAKE 1 CAPSULE BY MOUTH ONCE WEEKLY (EVERY 7 DAYS) 12 capsule 0   No current facility-administered medications for this visit.    Past Medical History:  Diagnosis Date   Abdominal aortic atherosclerosis (HCC) 09/2015   by xray   Anxiety    BCC (basal cell carcinoma of skin) 05/2011   on nose   BCC (basal cell carcinoma of skin) 08/09/2020   upper chest, [facility] 12/27/20   Breast lesion    a. diagnosed with complex sclerosing lesion with calcifications of the right breast in 08/2016   Chest pain    a. nuclear stress test 03/2012: normal; b. 09/2018  MV: EF >65%, no isch/scar; c. 07/2019 Cath: LM nl, LAD 40ost, 52m, LCX nl, RCA 50ost/p (iFR 0.98). EF 55-65% ->Med Rx.   COVID-19 virus infection 10/12/2020   11/2020, 05/2022   DDD (degenerative disc disease), lumbosacral 12/06/1992   Gallstones 09/2015   incidentally by xray   GERD (gastroesophageal reflux disease) 05/1999   Hiatal hernia    History of shingles 2009   ophthalmic, takes acyclovir daily   Hyperlipidemia 12/2003   Hypertension 1990   Mitral regurgitation    a. echo 03/2012: EF 60-65%, no RWMA, mild to mod AI/MR, mod TR, PASP 37 mmHg; b. 05/2019 Echo: EF 60-65%, mod MR.   Osteoarthritis 08/21/1989   Osteoporosis with fracture 11/1997   with compression fracture T12   PAD (peripheral artery disease) (HCC)    a. 01/2020 PTA of R Peroneal and R PT; b. 09/2021 ABI/TBI: R 1.04/09.2, L 1.36/0.17.   Permanent atrial  fibrillation (HCC) 03/16/2012   a. CHADS2VASc => 5 (HTN, age x 2, vascular disease, sex category)-->Eliquis 5 BID;  b. Recurrent AF in 06/2016;  c. 01/2017 s/p DCCV;  d. 02/2017 recurrent AFib->Amio added->DCCV; e. 03/2017 recurrent AFib, s/p reload of amio and DCCV; f. 12/2020 DCCV-->recurrent Afib, Amio d/c-->rate control.   POSTHERPETIC NEURALGIA 04/20/2007   Qualifier: Diagnosis of  By: Jillyn Hidden FNP, Mcarthur Rossetti    Rheumatoid arthritis (HCC)    RLS (restless legs syndrome)    SCC (squamous cell carcinoma) 05/01/2021   right posterior flank inferior to axilla - well differentiated squamous cell carcinoma with superificial infiltration, base involved. EDC 06/06/2021   Squamous cell carcinoma in situ 04/17/2022   Right dorsal hand, proximal. SCCis, inverted. Excised 05/08/2022   Squamous cell carcinoma of skin 12/19/2021   Proximal right dorsal hand. KA type. [facility] 12/19/2021   Squamous cell carcinoma of skin 12/19/2021   Distal right dorsal hand. KA type. [facility] 12/19/2021    Past Surgical History:  Procedure Laterality Date   ABDOMINAL HYSTERECTOMY  Age 54 - 59   S/P TAH   abdominal ultrasound  04/23/2004   Gallstones   BREAST EXCISIONAL BIOPSY Right 2018   neg/benign complex sclerosing lesion   BREAST LUMPECTOMY WITH RADIOACTIVE SEED LOCALIZATION Right 10/2016   breast lumpectomy with radioactive seed localization and margin assessment for complex sclerosing lesion Mikey Bussing)   BREAST LUMPECTOMY WITH RADIOACTIVE SEED LOCALIZATION Right 11/05/2016   Procedure: RIGHT BREAST LUMPECTOMY WITH RADIOACTIVE SEED LOCALIZATION;  Surgeon: Claud Kelp, MD;  Location:  SURGERY CENTER;  Service: General;  Laterality: Right;   CARDIAC CATHETERIZATION     CARDIOVERSION N/A 01/31/2017   Procedure: CARDIOVERSION;  Surgeon: Iran Ouch, MD;  Location: [facility];  Service: Cardiovascular;  Laterality: N/A;   CARDIOVERSION N/A 03/03/2017   Procedure: CARDIOVERSION;  Surgeon: Iran Ouch, MD;  Location: [facility];  Service: Cardiovascular;  Laterality: N/A;   CARDIOVERSION N/A 04/02/2017   Procedure: CARDIOVERSION;  Surgeon: Iran Ouch, MD;  Location: [facility];  Service: Cardiovascular;  Laterality: N/A;   CARDIOVERSION N/A 01/16/2018   Procedure: CARDIOVERSION;  Surgeon: Iran Ouch, MD;  Location: [facility];  Service: Cardiovascular;  Laterality: N/A;   CARDIOVERSION N/A 12/25/2020   Procedure: CARDIOVERSION;  Surgeon: Iran Ouch, MD;  Location: [facility];  Service: Cardiovascular;  Laterality: N/A;   CATARACT EXTRACTION  05/2010   left eye   CERVICAL DISCECTOMY  1992   Fusion (Dr. Jeral Fruit)   CORONARY PRESSURE/FFR STUDY N/A 07/12/2019   Procedure: INTRAVASCULAR PRESSURE WIRE/FFR STUDY;  Surgeon: Iran Ouch, MD;  Location: [facility];  Service: Cardiovascular;  Laterality: N/A;   ERCP N/A 10/30/2020   Procedure: ENDOSCOPIC RETROGRADE CHOLANGIOPANCREATOGRAPHY (ERCP);  Surgeon: Lynann Bologna, MD;  Location: [facility];  Service: Endoscopy;  Laterality: N/A;   ESI Bilateral 01/2016   S1 transforaminal ESI x3   ESOPHAGOGASTRODUODENOSCOPY  01/01/2002   with ulcer, bx. neg; + stricture gastric ulcer with hemorrhage   ESOPHAGOGASTRODUODENOSCOPY  04/23/2004   Esophageal stricture -- dilated   LOWER EXTREMITY ANGIOGRAPHY Left 02/01/2019   LOWER EXTREMITY ANGIOGRAPHY;  Surgeon: Annice Needy, MD   LOWER EXTREMITY ANGIOGRAPHY Right 03/25/2019   Procedure: LOWER EXTREMITY ANGIOGRAPHY;  Surgeon: Annice Needy, MD;  Location: [facility];  Service: Cardiovascular;  Laterality: Right;   LOWER EXTREMITY ANGIOGRAPHY Right 08/02/2019   Procedure: LOWER EXTREMITY ANGIOGRAPHY;  Surgeon: Annice Needy, MD;  Location: [facility];  Service: Cardiovascular;  Laterality: Right;   nuclear stress test  03/2012   no ischemia   PERIPHERAL VASCULAR BALLOON ANGIOPLASTY Left 01/2019   Percutaneous transluminal angioplasty of left anterior and posterior  tibial artery with 2.5 mm diameter by 22 cm length angioplasty balloon (Samon Dishner)   REMOVAL OF STONES  10/30/2020   Procedure: REMOVAL OF STONES;  Surgeon: Lynann Bologna, MD;  Location: [facility];  Service: Endoscopy;;   RIGHT/LEFT HEART CATH AND CORONARY ANGIOGRAPHY Bilateral 07/12/2019   Procedure: RIGHT/LEFT HEART CATH AND CORONARY ANGIOGRAPHY;  Surgeon: Iran Ouch, MD;  Location: [facility];  Service: Cardiovascular;  Laterality: Bilateral;   SKIN CANCER EXCISION  05/20/2011   BCC from nose   SPHINCTEROTOMY  10/30/2020   Procedure: SPHINCTEROTOMY;  Surgeon: Lynann Bologna, MD;  Location: [facility] END[facility];  Service: Endoscopy;;   US ECHOCARDIOGRAPHY  03/2012   in flutter, EF 60%, mild-mod aortic, mitral, tricuspid regurg, mildly dilated LA     Social History   Tobacco Use   Smoking status: Former    Current packs/day: 0.00    Average packs/day: 0.3 packs/day for 1 year (0.3 ttl pk-yrs)    Types: Cigarettes    Start date: 04/09/1975    Quit date: 04/08/1976    Years since quitting: 46.8   Smokeless tobacco: Never   Tobacco comments:    pt smokes occasionally "socially"  Vaping Use   Vaping status: Never Used  Substance Use Topics   Alcohol use: No    Alcohol/week: 1.0 standard drink of alcohol    Types: 1 Glasses of wine per week   Drug use: No       Family History  Problem Relation Age of Onset   Emphysema Mother    Heart disease Father        MI   Stroke Father        first at age 38.   Stroke Sister    Diabetes Sister    Hypertension Brother    Heart disease Brother        Heart Valve   Stroke Brother    Diabetes Brother    Cancer Brother        esophageal   Diabetes Brother    Diabetes Brother    Colon cancer Neg Hx      Allergies  Allergen Reactions   Penicillins Rash and Other (See Comments)    Childhood Allergy Has patient had a PCN reaction causing immediate rash, facial/tongue/throat swelling, SOB or lightheadedness with hypotension:  Yes Has patient had a PCN reaction causing severe rash  involving mucus membranes or skin necrosis: Unknown Has patient had a PCN reaction that required hospitalization: No Has patient had a PCN reaction occurring within the last 10 years: No If all of the above answers are "NO", then may proceed with Cephalosporin use.      REVIEW OF SYSTEMS (Negative unless checked)   Constitutional: [] Weight loss  [] Fever  [] Chills Cardiac: [] Chest pain   [] Chest pressure   [x] Palpitations   [] Shortness of breath when laying flat   [] Shortness of breath at rest   [x] Shortness of breath with exertion. Vascular:  [x] Pain in legs with walking   [x] Pain in legs at rest   [] Pain in legs when laying flat   [] Claudication   [] Pain in feet when walking  [] Pain in feet at rest  [] Pain in feet when laying flat   [] History of DVT   [] Phlebitis   [x] Swelling in legs   [] Varicose veins   [] Non-healing ulcers Pulmonary:   [] Uses home oxygen   [] Productive cough   [] Hemoptysis   [] Wheeze  [] COPD   [] Asthma Neurologic:  [] Dizziness  [] Blackouts   [] Seizures   [] History of stroke   [] History of TIA  [] Aphasia   [] Temporary blindness   [] Dysphagia   [] Weakness or numbness in arms   [x] Weakness or numbness in legs Musculoskeletal:  [x] Arthritis   [] Joint swelling   [] Joint pain   [] Low back pain Hematologic:  [] Easy bruising  [] Easy bleeding   [] Hypercoagulable state   [] Anemic  [] Hepatitis Gastrointestinal:  [] Blood in stool   [] Vomiting blood  [] Gastroesophageal reflux/heartburn   [] Abdominal pain Genitourinary:  [] Chronic kidney disease   [] Difficult urination  [] Frequent urination  [] Burning with urination   [] Hematuria Skin:  [] Rashes   [] Ulcers   [] Wounds Psychological:  [] History of anxiety   []  History of major depression.  Physical Examination  BP (!) 150/80 (BP Location: Right Arm)   Pulse 76   Resp 16  Gen:  WD/WN, NAD. Appears younger than stated age. Head: [place]/AT, No temporalis wasting. Ear/Nose/Throat:  Hearing diminished, nares w/o erythema or drainage Eyes: Conjunctiva clear. Sclera non-icteric Neck: Supple.  Trachea midline Pulmonary:  Good air movement, no use of accessory muscles.  Cardiac: irregular Vascular:  Vessel Right Left  Radial Palpable Palpable                          PT Palpable Palpable  DP Palpable Palpable    Musculoskeletal: M/S 5/5 throughout..  1+ lower extremity edema purplish discoloration are present in both feet and ankles.  Somewhat sluggish capillary refill. Neurologic: Sensation grossly intact in extremities.  Symmetrical.  Speech is fluent.  Psychiatric: Judgment intact, Mood & affect appropriate for pt's clinical situation. Dermatologic: No rashes or ulcers noted.  No cellulitis or open wounds.      Labs No results found for this or any previous visit (from the past 2160 hour(s)).  Radiology No results found.  Assessment/Plan  Persistent atrial fibrillation Rate controlled on Pacerone   Essential hypertension blood pressure control important in reducing the progression of atherosclerotic disease. On appropriate oral medications.   PAD (peripheral artery disease) (HCC) ABIs today are 0.98 on the right and 1.08 on the left with triphasic waveforms.  Digit pressures are reduced but no role for intervention at this time.  Follow-up in 6 months.   CKD (chronic kidney disease) stage 3, GFR 30-59 ml/min (HCC) Avoid contrast unless absolutely necessary.   Dyslipidemia lipid control important in reducing  the progression of atherosclerotic disease. Continue statin therapy     Festus Barren, MD  01/14/2023 5:42 PM    This note was created with Dragon medical transcription system.  Any errors from dictation are purely unintentional

## 2023-01-15 LAB — VAS US ABI WITH/WO TBI
Left ABI: 1.48
Right ABI: 0.98

## 2023-01-16 NOTE — Progress Notes (Signed)
LV end diastolic pressure is mildly elevated.  The left ventricular ejection fraction is 55-65% by visual estimate.  Ost RCA to Prox RCA lesion is 50% stenosed.  1.  Mild to moderate  nonobstructive coronary artery disease.  The worst stenosis was 50% in the ostium of the right coronary artery which was calcified and eccentric.  Fractional flow reserve was done on this lesion and was not significant with an iFR ratio of 0.98. 2.  Normal LV systolic function. 3.  Right heart catheterization showed mildly elevated left-sided filling pressure with pulmonary capillary wedge pressure 15 mmHg, mild pulmonary hypertension at 41 over 13 mmHg and low normal cardiac output at 4.02 with a cardiac index of 2.48.  Pulmonary vascular resistance is 2.74 Woods unit  Recommendations: Recommend medical therapy for nonobstructive coronary artery disease. Volume status appears reasonable and recommend continuing same dose of furosemide. The patient seems to be reasonably optimized from a cardiac standpoint. Avoid catheterization via the right radial artery in the future if needed given small vessel diameter which is diffusely diseased.  Findings Coronary Findings Diagnostic  Dominance: Right  Left Main Vessel is angiographically normal.  Left Anterior Descending Ost LAD lesion is 40% stenosed. Mid LAD lesion is 30% stenosed.  Left Circumflex Vessel is angiographically normal.  First Obtuse Marginal Branch Vessel is angiographically normal.  Third Obtuse Marginal Branch Vessel is angiographically normal.  Right Coronary Artery Ost RCA to Prox RCA lesion is 50% stenosed. The lesion is eccentric. The lesion is severely calcified. Pressure wire/FFR was performed on the lesion. Fractional flow reserve evaluation was performed in the standard fashion after giving additional heparin.  I used a 5 [place].  iFR ratio was 0.98 monitor twice indicating no physiologic significance.  Intervention  No interventions have been documented.   STRESS TESTS  NM MYOCAR MULTI W/SPECT W 10/02/2018  Narrative  Normal pharmacologic myocardial perfusion stress test without significant  ischemia or scar.  The left ventricular ejection fraction is normal (>65%).  This is a low risk study.  Attenuation correction CT is notable for coronary artery and aortic atherosclerotic calcification, as well as aortic valve and mitral annular calcification.  A moderate-sided hiatal hernia is present.  Hyperdensity of the liver is noted, which can be seen with long-term amiodarone use. Clinical/laboratory correlation recommended.  Splenic calcifications suggest prior granulomatous disease.   ECHOCARDIOGRAM  ECHOCARDIOGRAM COMPLETE 04/18/2022  Narrative ECHOCARDIOGRAM REPORT    Patient Name:   [name] Date of Exam: 04/18/2022 Medical Rec #:  [id number]       Height:       63.0 in Accession #:    [id number]      Weight:       146.2 lb Date of Birth:  [birth year]       BSA:          1.693 m Patient Age:    89 years        BP:           177/85 mmHg Patient Gender: F               HR:           73-85 bpm. Exam Location:  [facility]  Procedure: 2D Echo, Cardiac Doppler and Color Doppler  Indications:    I48.91* Unspeicified atrial fibrillation  History:        Patient has prior history of Echocardiogram examinations, most recent 05/28/2019. CHF, CAD, Pulmonary HTN and PAD, Mitral Valve Disease and  Cardiology Office Note    Date:  01/17/2023  ID:  [name], DOB Jul 11, 1929, MRN [id number] PCP:  Eustaquio Boyden, MD  Cardiologist:  Crystal Bears, MD  Electrophysiologist:  None   Chief Complaint: Follow up permanent afib, dyspnea on exertion  History of Present Illness: .    [name] is a 89 y.o. female with visit-pertinent history of permanent atrial fibrillation, nonobstructive CAD, hypertension, hyperlipidemia, peripheral 32-year-old disease, moderate mitral regurgitation, and rheumatoid arthritis.  Echocardiogram in 2021 showed normal LV systolic function with grade 2 diastolic dysfunction, severe pulmonary hypertension, moderate MR and moderate TR.  April 2021 she underwent diagnostic catheterization which showed mild to moderate nonobstructive CAD including a 50% ostial RCA stenosis that was not significant by FFR.  Right heart cath showed mildly elevated filling pressures with mild pulmonary hypertension and low normal cardiac output.  She fell and fractured her right wrist in July 2021, she had acute on chronic renal failure requiring discontinuation of furosemide therapy.  In October 2021 she underwent PTA of the right peroneal and posterior tibial arteries by vascular surgery.  In June 2022 she had COVID and then was subsequently hospitalized in July 2020 with cholelithiasis.  She is found to have elevated LFTs and mild lactic acidosis.  She was septic with E. coli.  She was diagnosed with ascending cholangitis and was treated with antibiotics.  In September 2022 she was noted to be in atrial fibrillation amiodarone dose was increased, underwent successful cardioversion.  However upon follow-up she was noted to be back in atrial fibrillation, decision was made to pursue rate control.  Amiodarone was discontinued at that time.  In 02/2022 she presented with increased dyspnea on exertion and fatigue.  Echo 04/18/2022 indicated LV EF of 50 to 55%, LV with low normal  function, no RWMA, mild LV hypertrophy with moderate basal septal hypertrophy diastolic parameters were indeterminate.  Mild to moderate MR, moderate TR.   She was last in clinic on 05/01/2022 by Dr. Kirke Corin, she had stable from a cardiac perspective. From 2/25-06/11/2022 she was admitted with sepsis and acute hypoxemic failure due to covid-19 infection.  Today she presents for follow up. She reports she is doing well, she does endorse dyspnea with exertion or when she talks on the phone for extended periods. She denies chest pain. Notes she has lower extremity edema when she sits for periods of time, quickly resolves with leg elevation. Discussed her weight gain of 10 lbs in three months, notes she has been very sedentary in recent months and feels she eats a "good bit."   Labwork independently reviewed: 09/26/2022: Hemoglobin 12.8, hematocrit 39.6, sodium 145, potassium 4.2, creatinine 0.92  ROS: .    Today she denies chest pain, fatigue, palpitations, melena, hematuria, hemoptysis, diaphoresis, weakness, presyncope, syncope, orthopnea, and PND.  All other systems are reviewed and otherwise negative.  Studies Reviewed: Marland Kitchen    EKG:  EKG is ordered today, personally reviewed, demonstrating  EKG Interpretation Date/Time:  Friday January 17 2023 15:23:30 EDT Ventricular Rate:  78 PR Interval:    QRS Duration:  82 QT Interval:  422 QTC Calculation: 481 R Axis:   79  Text Interpretation: Atrial fibrillation Confirmed by Reather Littler [phone]) on 01/17/2023 3:50:48 PM   CV Studies:  Cardiac Studies & Procedures   CARDIAC CATHETERIZATION  CARDIAC CATHETERIZATION 07/12/2019  Narrative  Ost LAD lesion is 40% stenosed.  Mid LAD lesion is 30% stenosed.  The left ventricular systolic function is normal.  Cardiology Office Note    Date:  01/17/2023  ID:  [name], DOB Jul 11, 1929, MRN [id number] PCP:  Eustaquio Boyden, MD  Cardiologist:  Crystal Bears, MD  Electrophysiologist:  None   Chief Complaint: Follow up permanent afib, dyspnea on exertion  History of Present Illness: .    [name] is a 89 y.o. female with visit-pertinent history of permanent atrial fibrillation, nonobstructive CAD, hypertension, hyperlipidemia, peripheral 32-year-old disease, moderate mitral regurgitation, and rheumatoid arthritis.  Echocardiogram in 2021 showed normal LV systolic function with grade 2 diastolic dysfunction, severe pulmonary hypertension, moderate MR and moderate TR.  April 2021 she underwent diagnostic catheterization which showed mild to moderate nonobstructive CAD including a 50% ostial RCA stenosis that was not significant by FFR.  Right heart cath showed mildly elevated filling pressures with mild pulmonary hypertension and low normal cardiac output.  She fell and fractured her right wrist in July 2021, she had acute on chronic renal failure requiring discontinuation of furosemide therapy.  In October 2021 she underwent PTA of the right peroneal and posterior tibial arteries by vascular surgery.  In June 2022 she had COVID and then was subsequently hospitalized in July 2020 with cholelithiasis.  She is found to have elevated LFTs and mild lactic acidosis.  She was septic with E. coli.  She was diagnosed with ascending cholangitis and was treated with antibiotics.  In September 2022 she was noted to be in atrial fibrillation amiodarone dose was increased, underwent successful cardioversion.  However upon follow-up she was noted to be back in atrial fibrillation, decision was made to pursue rate control.  Amiodarone was discontinued at that time.  In 02/2022 she presented with increased dyspnea on exertion and fatigue.  Echo 04/18/2022 indicated LV EF of 50 to 55%, LV with low normal  function, no RWMA, mild LV hypertrophy with moderate basal septal hypertrophy diastolic parameters were indeterminate.  Mild to moderate MR, moderate TR.   She was last in clinic on 05/01/2022 by Dr. Kirke Corin, she had stable from a cardiac perspective. From 2/25-06/11/2022 she was admitted with sepsis and acute hypoxemic failure due to covid-19 infection.  Today she presents for follow up. She reports she is doing well, she does endorse dyspnea with exertion or when she talks on the phone for extended periods. She denies chest pain. Notes she has lower extremity edema when she sits for periods of time, quickly resolves with leg elevation. Discussed her weight gain of 10 lbs in three months, notes she has been very sedentary in recent months and feels she eats a "good bit."   Labwork independently reviewed: 09/26/2022: Hemoglobin 12.8, hematocrit 39.6, sodium 145, potassium 4.2, creatinine 0.92  ROS: .    Today she denies chest pain, fatigue, palpitations, melena, hematuria, hemoptysis, diaphoresis, weakness, presyncope, syncope, orthopnea, and PND.  All other systems are reviewed and otherwise negative.  Studies Reviewed: Marland Kitchen    EKG:  EKG is ordered today, personally reviewed, demonstrating  EKG Interpretation Date/Time:  Friday January 17 2023 15:23:30 EDT Ventricular Rate:  78 PR Interval:    QRS Duration:  82 QT Interval:  422 QTC Calculation: 481 R Axis:   79  Text Interpretation: Atrial fibrillation Confirmed by Reather Littler [phone]) on 01/17/2023 3:50:48 PM   CV Studies:  Cardiac Studies & Procedures   CARDIAC CATHETERIZATION  CARDIAC CATHETERIZATION 07/12/2019  Narrative  Ost LAD lesion is 40% stenosed.  Mid LAD lesion is 30% stenosed.  The left ventricular systolic function is normal.  Cardiology Office Note    Date:  01/17/2023  ID:  [name], DOB Jul 11, 1929, MRN [id number] PCP:  Eustaquio Boyden, MD  Cardiologist:  Crystal Bears, MD  Electrophysiologist:  None   Chief Complaint: Follow up permanent afib, dyspnea on exertion  History of Present Illness: .    [name] is a 89 y.o. female with visit-pertinent history of permanent atrial fibrillation, nonobstructive CAD, hypertension, hyperlipidemia, peripheral 32-year-old disease, moderate mitral regurgitation, and rheumatoid arthritis.  Echocardiogram in 2021 showed normal LV systolic function with grade 2 diastolic dysfunction, severe pulmonary hypertension, moderate MR and moderate TR.  April 2021 she underwent diagnostic catheterization which showed mild to moderate nonobstructive CAD including a 50% ostial RCA stenosis that was not significant by FFR.  Right heart cath showed mildly elevated filling pressures with mild pulmonary hypertension and low normal cardiac output.  She fell and fractured her right wrist in July 2021, she had acute on chronic renal failure requiring discontinuation of furosemide therapy.  In October 2021 she underwent PTA of the right peroneal and posterior tibial arteries by vascular surgery.  In June 2022 she had COVID and then was subsequently hospitalized in July 2020 with cholelithiasis.  She is found to have elevated LFTs and mild lactic acidosis.  She was septic with E. coli.  She was diagnosed with ascending cholangitis and was treated with antibiotics.  In September 2022 she was noted to be in atrial fibrillation amiodarone dose was increased, underwent successful cardioversion.  However upon follow-up she was noted to be back in atrial fibrillation, decision was made to pursue rate control.  Amiodarone was discontinued at that time.  In 02/2022 she presented with increased dyspnea on exertion and fatigue.  Echo 04/18/2022 indicated LV EF of 50 to 55%, LV with low normal  function, no RWMA, mild LV hypertrophy with moderate basal septal hypertrophy diastolic parameters were indeterminate.  Mild to moderate MR, moderate TR.   She was last in clinic on 05/01/2022 by Dr. Kirke Corin, she had stable from a cardiac perspective. From 2/25-06/11/2022 she was admitted with sepsis and acute hypoxemic failure due to covid-19 infection.  Today she presents for follow up. She reports she is doing well, she does endorse dyspnea with exertion or when she talks on the phone for extended periods. She denies chest pain. Notes she has lower extremity edema when she sits for periods of time, quickly resolves with leg elevation. Discussed her weight gain of 10 lbs in three months, notes she has been very sedentary in recent months and feels she eats a "good bit."   Labwork independently reviewed: 09/26/2022: Hemoglobin 12.8, hematocrit 39.6, sodium 145, potassium 4.2, creatinine 0.92  ROS: .    Today she denies chest pain, fatigue, palpitations, melena, hematuria, hemoptysis, diaphoresis, weakness, presyncope, syncope, orthopnea, and PND.  All other systems are reviewed and otherwise negative.  Studies Reviewed: Marland Kitchen    EKG:  EKG is ordered today, personally reviewed, demonstrating  EKG Interpretation Date/Time:  Friday January 17 2023 15:23:30 EDT Ventricular Rate:  78 PR Interval:    QRS Duration:  82 QT Interval:  422 QTC Calculation: 481 R Axis:   79  Text Interpretation: Atrial fibrillation Confirmed by Reather Littler [phone]) on 01/17/2023 3:50:48 PM   CV Studies:  Cardiac Studies & Procedures   CARDIAC CATHETERIZATION  CARDIAC CATHETERIZATION 07/12/2019  Narrative  Ost LAD lesion is 40% stenosed.  Mid LAD lesion is 30% stenosed.  The left ventricular systolic function is normal.  LV end diastolic pressure is mildly elevated.  The left ventricular ejection fraction is 55-65% by visual estimate.  Ost RCA to Prox RCA lesion is 50% stenosed.  1.  Mild to moderate  nonobstructive coronary artery disease.  The worst stenosis was 50% in the ostium of the right coronary artery which was calcified and eccentric.  Fractional flow reserve was done on this lesion and was not significant with an iFR ratio of 0.98. 2.  Normal LV systolic function. 3.  Right heart catheterization showed mildly elevated left-sided filling pressure with pulmonary capillary wedge pressure 15 mmHg, mild pulmonary hypertension at 41 over 13 mmHg and low normal cardiac output at 4.02 with a cardiac index of 2.48.  Pulmonary vascular resistance is 2.74 Woods unit  Recommendations: Recommend medical therapy for nonobstructive coronary artery disease. Volume status appears reasonable and recommend continuing same dose of furosemide. The patient seems to be reasonably optimized from a cardiac standpoint. Avoid catheterization via the right radial artery in the future if needed given small vessel diameter which is diffusely diseased.  Findings Coronary Findings Diagnostic  Dominance: Right  Left Main Vessel is angiographically normal.  Left Anterior Descending Ost LAD lesion is 40% stenosed. Mid LAD lesion is 30% stenosed.  Left Circumflex Vessel is angiographically normal.  First Obtuse Marginal Branch Vessel is angiographically normal.  Third Obtuse Marginal Branch Vessel is angiographically normal.  Right Coronary Artery Ost RCA to Prox RCA lesion is 50% stenosed. The lesion is eccentric. The lesion is severely calcified. Pressure wire/FFR was performed on the lesion. Fractional flow reserve evaluation was performed in the standard fashion after giving additional heparin.  I used a 5 [place].  iFR ratio was 0.98 monitor twice indicating no physiologic significance.  Intervention  No interventions have been documented.   STRESS TESTS  NM MYOCAR MULTI W/SPECT W 10/02/2018  Narrative  Normal pharmacologic myocardial perfusion stress test without significant  ischemia or scar.  The left ventricular ejection fraction is normal (>65%).  This is a low risk study.  Attenuation correction CT is notable for coronary artery and aortic atherosclerotic calcification, as well as aortic valve and mitral annular calcification.  A moderate-sided hiatal hernia is present.  Hyperdensity of the liver is noted, which can be seen with long-term amiodarone use. Clinical/laboratory correlation recommended.  Splenic calcifications suggest prior granulomatous disease.   ECHOCARDIOGRAM  ECHOCARDIOGRAM COMPLETE 04/18/2022  Narrative ECHOCARDIOGRAM REPORT    Patient Name:   [name] Date of Exam: 04/18/2022 Medical Rec #:  [id number]       Height:       63.0 in Accession #:    [id number]      Weight:       146.2 lb Date of Birth:  [birth year]       BSA:          1.693 m Patient Age:    89 years        BP:           177/85 mmHg Patient Gender: F               HR:           73-85 bpm. Exam Location:  [facility]  Procedure: 2D Echo, Cardiac Doppler and Color Doppler  Indications:    I48.91* Unspeicified atrial fibrillation  History:        Patient has prior history of Echocardiogram examinations, most recent 05/28/2019. CHF, CAD, Pulmonary HTN and PAD, Mitral Valve Disease and  LV end diastolic pressure is mildly elevated.  The left ventricular ejection fraction is 55-65% by visual estimate.  Ost RCA to Prox RCA lesion is 50% stenosed.  1.  Mild to moderate  nonobstructive coronary artery disease.  The worst stenosis was 50% in the ostium of the right coronary artery which was calcified and eccentric.  Fractional flow reserve was done on this lesion and was not significant with an iFR ratio of 0.98. 2.  Normal LV systolic function. 3.  Right heart catheterization showed mildly elevated left-sided filling pressure with pulmonary capillary wedge pressure 15 mmHg, mild pulmonary hypertension at 41 over 13 mmHg and low normal cardiac output at 4.02 with a cardiac index of 2.48.  Pulmonary vascular resistance is 2.74 Woods unit  Recommendations: Recommend medical therapy for nonobstructive coronary artery disease. Volume status appears reasonable and recommend continuing same dose of furosemide. The patient seems to be reasonably optimized from a cardiac standpoint. Avoid catheterization via the right radial artery in the future if needed given small vessel diameter which is diffusely diseased.  Findings Coronary Findings Diagnostic  Dominance: Right  Left Main Vessel is angiographically normal.  Left Anterior Descending Ost LAD lesion is 40% stenosed. Mid LAD lesion is 30% stenosed.  Left Circumflex Vessel is angiographically normal.  First Obtuse Marginal Branch Vessel is angiographically normal.  Third Obtuse Marginal Branch Vessel is angiographically normal.  Right Coronary Artery Ost RCA to Prox RCA lesion is 50% stenosed. The lesion is eccentric. The lesion is severely calcified. Pressure wire/FFR was performed on the lesion. Fractional flow reserve evaluation was performed in the standard fashion after giving additional heparin.  I used a 5 [place].  iFR ratio was 0.98 monitor twice indicating no physiologic significance.  Intervention  No interventions have been documented.   STRESS TESTS  NM MYOCAR MULTI W/SPECT W 10/02/2018  Narrative  Normal pharmacologic myocardial perfusion stress test without significant  ischemia or scar.  The left ventricular ejection fraction is normal (>65%).  This is a low risk study.  Attenuation correction CT is notable for coronary artery and aortic atherosclerotic calcification, as well as aortic valve and mitral annular calcification.  A moderate-sided hiatal hernia is present.  Hyperdensity of the liver is noted, which can be seen with long-term amiodarone use. Clinical/laboratory correlation recommended.  Splenic calcifications suggest prior granulomatous disease.   ECHOCARDIOGRAM  ECHOCARDIOGRAM COMPLETE 04/18/2022  Narrative ECHOCARDIOGRAM REPORT    Patient Name:   [name] Date of Exam: 04/18/2022 Medical Rec #:  [id number]       Height:       63.0 in Accession #:    [id number]      Weight:       146.2 lb Date of Birth:  [birth year]       BSA:          1.693 m Patient Age:    89 years        BP:           177/85 mmHg Patient Gender: F               HR:           73-85 bpm. Exam Location:  [facility]  Procedure: 2D Echo, Cardiac Doppler and Color Doppler  Indications:    I48.91* Unspeicified atrial fibrillation  History:        Patient has prior history of Echocardiogram examinations, most recent 05/28/2019. CHF, CAD, Pulmonary HTN and PAD, Mitral Valve Disease and

## 2023-01-17 ENCOUNTER — Ambulatory Visit: Payer: Medicare Other | Attending: Nurse Practitioner | Admitting: Cardiology

## 2023-01-17 ENCOUNTER — Encounter: Payer: Self-pay | Admitting: Nurse Practitioner

## 2023-01-17 VITALS — BP 118/76 | HR 78 | Ht 62.0 in | Wt 150.6 lb

## 2023-01-17 DIAGNOSIS — R0609 Other forms of dyspnea: Secondary | ICD-10-CM

## 2023-01-17 DIAGNOSIS — I739 Peripheral vascular disease, unspecified: Secondary | ICD-10-CM | POA: Diagnosis not present

## 2023-01-17 DIAGNOSIS — I2510 Atherosclerotic heart disease of native coronary artery without angina pectoris: Secondary | ICD-10-CM

## 2023-01-17 DIAGNOSIS — I5032 Chronic diastolic (congestive) heart failure: Secondary | ICD-10-CM | POA: Diagnosis not present

## 2023-01-17 DIAGNOSIS — E785 Hyperlipidemia, unspecified: Secondary | ICD-10-CM

## 2023-01-17 DIAGNOSIS — I4821 Permanent atrial fibrillation: Secondary | ICD-10-CM

## 2023-01-17 DIAGNOSIS — I340 Nonrheumatic mitral (valve) insufficiency: Secondary | ICD-10-CM | POA: Diagnosis not present

## 2023-01-17 DIAGNOSIS — I10 Essential (primary) hypertension: Secondary | ICD-10-CM

## 2023-01-17 MED ORDER — FUROSEMIDE 20 MG PO TABS: 20.0000 mg | ORAL_TABLET | Freq: Every day | ORAL | 11 refills | Status: DC

## 2023-01-17 MED ORDER — POTASSIUM 99 MG PO TABS: 1.0000 | ORAL_TABLET | Freq: Every day | ORAL | Status: DC

## 2023-01-17 NOTE — Patient Instructions (Signed)
Medication Instructions:  INCREASE the Furosemdie to 20 mg once daily INCREASE the Potassium to daily   *If you need a refill on your cardiac medications before your next appointment, please call your pharmacy*   Lab Work: None ordered If you have labs (blood work) drawn today and your tests are completely normal, you will receive your results only by: MyChart Message (if you have MyChart) OR A paper copy in the mail If you have any lab test that is abnormal or we need to change your treatment, we will call you to review the results.   Testing/Procedures: None ordered   Follow-Up: At [facility], you and your health needs are our priority.  As part of our continuing mission to provide you with exceptional heart care, we have created designated Provider Care Teams.  These Care Teams include your primary Cardiologist (physician) and Advanced Practice Providers (APPs -  Physician Assistants and Nurse Practitioners) who all work together to provide you with the care you need, when you need it.  We recommend signing up for the patient portal called "MyChart".  Sign up information is provided on this After Visit Summary.  MyChart is used to connect with patients for Virtual Visits (Telemedicine).  Patients are able to view lab/test results, encounter notes, upcoming appointments, etc.  Non-urgent messages can be sent to your provider as well.   To learn more about what you can do with MyChart, go to [email or web address].    Your next appointment:   2 month(s)  Provider:   You may see Lorine Bears, MD or one of the following Advanced Practice Providers on your designated Care Team:   Nicolasa Ducking, NP

## 2023-01-29 ENCOUNTER — Ambulatory Visit (INDEPENDENT_AMBULATORY_CARE_PROVIDER_SITE_OTHER): Payer: Medicare Other | Admitting: Family Medicine

## 2023-01-29 ENCOUNTER — Encounter: Payer: Self-pay | Admitting: Family Medicine

## 2023-01-29 VITALS — BP 124/66 | HR 67 | Temp 97.9°F | Ht 61.0 in | Wt 147.0 lb

## 2023-01-29 DIAGNOSIS — G3184 Mild cognitive impairment, so stated: Secondary | ICD-10-CM | POA: Diagnosis not present

## 2023-01-29 DIAGNOSIS — Z0000 Encounter for general adult medical examination without abnormal findings: Secondary | ICD-10-CM | POA: Diagnosis not present

## 2023-01-29 DIAGNOSIS — K5909 Other constipation: Secondary | ICD-10-CM

## 2023-01-29 DIAGNOSIS — Z7189 Other specified counseling: Secondary | ICD-10-CM | POA: Diagnosis not present

## 2023-01-29 DIAGNOSIS — Z23 Encounter for immunization: Secondary | ICD-10-CM

## 2023-01-29 DIAGNOSIS — E782 Mixed hyperlipidemia: Secondary | ICD-10-CM

## 2023-01-29 DIAGNOSIS — I10 Essential (primary) hypertension: Secondary | ICD-10-CM

## 2023-01-29 DIAGNOSIS — E559 Vitamin D deficiency, unspecified: Secondary | ICD-10-CM

## 2023-01-29 DIAGNOSIS — E538 Deficiency of other specified B group vitamins: Secondary | ICD-10-CM | POA: Diagnosis not present

## 2023-01-29 DIAGNOSIS — G6289 Other specified polyneuropathies: Secondary | ICD-10-CM

## 2023-01-29 DIAGNOSIS — I4819 Other persistent atrial fibrillation: Secondary | ICD-10-CM

## 2023-01-29 DIAGNOSIS — I700 Atherosclerosis of aorta: Secondary | ICD-10-CM

## 2023-01-29 DIAGNOSIS — M810 Age-related osteoporosis without current pathological fracture: Secondary | ICD-10-CM

## 2023-01-29 DIAGNOSIS — F331 Major depressive disorder, recurrent, moderate: Secondary | ICD-10-CM

## 2023-01-29 DIAGNOSIS — G2581 Restless legs syndrome: Secondary | ICD-10-CM

## 2023-01-29 DIAGNOSIS — N1831 Chronic kidney disease, stage 3a: Secondary | ICD-10-CM

## 2023-01-29 DIAGNOSIS — Z66 Do not resuscitate: Secondary | ICD-10-CM | POA: Diagnosis not present

## 2023-01-29 DIAGNOSIS — K219 Gastro-esophageal reflux disease without esophagitis: Secondary | ICD-10-CM

## 2023-01-29 DIAGNOSIS — I739 Peripheral vascular disease, unspecified: Secondary | ICD-10-CM

## 2023-01-29 DIAGNOSIS — I5032 Chronic diastolic (congestive) heart failure: Secondary | ICD-10-CM

## 2023-01-29 DIAGNOSIS — R519 Headache, unspecified: Secondary | ICD-10-CM

## 2023-01-29 DIAGNOSIS — D509 Iron deficiency anemia, unspecified: Secondary | ICD-10-CM | POA: Diagnosis not present

## 2023-01-29 DIAGNOSIS — R2681 Unsteadiness on feet: Secondary | ICD-10-CM

## 2023-01-29 MED ORDER — DULOXETINE HCL 30 MG PO CPEP
30.0000 mg | ORAL_CAPSULE | Freq: Every day | ORAL | 12 refills | Status: DC
Start: 2023-01-29 — End: 2023-09-25

## 2023-01-29 MED ORDER — ATORVASTATIN CALCIUM 20 MG PO TABS
20.0000 mg | ORAL_TABLET | Freq: Every day | ORAL | 4 refills | Status: DC
Start: 2023-01-29 — End: 2023-09-25

## 2023-01-29 MED ORDER — LISINOPRIL 10 MG PO TABS: 10.0000 mg | ORAL_TABLET | Freq: Every day | ORAL | 4 refills | Status: DC

## 2023-01-29 MED ORDER — PANTOPRAZOLE SODIUM 40 MG PO TBEC
40.0000 mg | DELAYED_RELEASE_TABLET | Freq: Every day | ORAL | 4 refills | Status: DC
Start: 2023-01-29 — End: 2023-09-25

## 2023-01-29 MED ORDER — GABAPENTIN 300 MG PO CAPS: 300.0000 mg | ORAL_CAPSULE | Freq: Two times a day (BID) | ORAL | 3 refills | Status: DC

## 2023-01-29 MED ORDER — MEMANTINE HCL 5 MG PO TABS
5.0000 mg | ORAL_TABLET | Freq: Two times a day (BID) | ORAL | 12 refills | Status: DC
Start: 2023-01-29 — End: 2023-07-30

## 2023-01-29 NOTE — Progress Notes (Signed)
Ph: [phone] Fax: [phone]   Patient ID: [name], female    DOB: [birth year], 89 y.o.   MRN: [id number]  This visit was conducted in person.  BP 124/66   Pulse 67   Temp 97.9 F (36.6 C) (Oral)   Ht 5\' 1"  (1.549 m)   Wt 147 lb (66.7 kg)   SpO2 96%   BMI 27.78 kg/m    CC: AMW/CPE Subjective:   HPI: [name] is a 89 y.o. female presenting on 01/29/2023 for Medicare Wellness (Pt accompanied by daughter, [name]. )   Did not see health advisor.  Hearing Screening - Comments:: Wears B hearing aids. Wearing at today's OV.  Vision Screening - Comments:: Last eye exam, Jun/Jul 2024.  Flowsheet Row Office Visit from 01/29/2023 in [facility] [place]  PHQ-2 Total Score 1          01/29/2023    3:03 PM 10/08/2022    3:40 PM 08/02/2022    3:32 PM 07/09/2022    3:13 PM 06/19/2022    2:55 PM  Fall Risk   Falls in the past year? 0 0 Exclusion - non ambulatory 0 1  Risk for fall due to :   Impaired mobility    Follow up   Falls evaluation completed     Sees PM&R. She received L subacromial injection with some benefit. Acute on chronic low back pain with radiation to bilateral buttock, groin. Managed with tylenol 1000mg  BID, tizanidine 2mg  nightly PRN, tramadol 50mg  1-2 tab at bedtime PRN.   Ongoing anxiety, agitation, restlessness worse at night - recently started melatonin 10mg  nightly for sleep, also taking plain tylenol in evenings. She is sleeping better. Notes increased daytime naps. Ongoing low energy.    RLS - doing better on requip 2mg  BID, gaabapentin, we also started oral iron supplement twice weekly.  R head pressure - may also be doing better on higher gabapentin 300mg  BID dose.   Worsening stamina, affecting mobility. Regularly using transport chair.   Preventative: Colon cancer screening - age out. Normal iFOBs last 05/2012. Denies blood in stool or urine.  Well woman - s/p hysterectomy, aged out of further  screening.  Mammogram 10/2021 - Birads1 @ [place], to get every 2 yrs.  DEXA 07/2016 - T -2.7 spine, -2.6 hip. Known osteoporosis with compression fractures, latest after fall 02/2017 now on prolia (started 08/2017)  DEXA 06/2019 - osteopenia T -2.4 L forearm. FRAX - hip fx risk >3%. Continues prolia shots - 07/31/2022. Due for repeat.  Lung cancer screen - not eligible  Flu - yearly COVID vaccine Pfizer 05/2019, 06/2019, no booster Td - 2005 RSV 2023  Pneumovax 2005, 06/2020, prevnar-13 01/2015 Shingrix - discussed - h/o ocular shingles. Ophtho didn't strongly recommend this. They will defer for now Advanced planning - Living will scanned 04/2017. No HCPOA form. Asked to bring me copy - son Jillyn Hidden is HCPOA. Does not want extended life support.  Seat belt use discussed Sunscreen use discussed. In h/o melanoma - sees dermatology [facility]). Ex smoker remotely quit  Alcohol - seldom  Dentist - has dentures  Eye exam yearly  Bowel - constipation managed with miralax  Bladder - urge incontinence with mild stress symptoms - no significant trouble   Daughter [name] cares for her  From [place].  Widowed 01/1999; 1st marriage 25 years / 2nd marriage 13 years. One son deceased 02-13-2023. Activity: active with dog Diet: good water, fruits/vegetables daily  Relevant past medical, surgical, family and social history reviewed and updated as indicated. Interim medical history since our last visit reviewed. Allergies and medications reviewed and updated. Outpatient Medications Prior to Visit  Medication Sig Dispense Refill   acetaminophen (TYLENOL) 500 MG tablet Take 500 mg by mouth every 6 (six) hours as needed for mild pain.     Docusate Calcium (STOOL SOFTENER PO) Take 1 capsule by mouth daily.     ELIQUIS 5 MG TABS tablet Take 1 tablet by mouth twice daily 180 tablet 1   furosemide (LASIX) 20 MG tablet Take 1 tablet (20 mg total) by mouth daily. 30 tablet 11   Iron, Ferrous Sulfate, 325 (65 Fe) MG TABS  Take 325 mg by mouth 2 (two) times a week.     Melatonin 10 MG TABS Take 1 tablet by mouth at bedtime.     metoprolol succinate (TOPROL-XL) 100 MG 24 hr tablet Take 1 tablet (100 mg total) by mouth in the morning and at bedtime. 180 tablet 0   polyethylene glycol (MIRALAX / GLYCOLAX) 17 g packet Take 17 g by mouth daily as needed for mild constipation. 14 each 0   Potassium 99 MG TABS Take 1 tablet (99 mg total) by mouth daily.     prednisoLONE acetate (PRED FORTE) 1 % ophthalmic suspension Place 1 drop into the left eye daily.      rOPINIRole (REQUIP) 2 MG tablet Take 1 tablet (2 mg total) by mouth in the morning and at bedtime. 60 tablet 6   traMADol (ULTRAM) 50 MG tablet Take 1-2 tablets (50-100 mg total) by mouth at bedtime as needed. 30 tablet 0   Vitamin D, Ergocalciferol, (DRISDOL) 1.25 MG (50000 UNIT) CAPS capsule TAKE 1 CAPSULE BY MOUTH ONCE WEEKLY (EVERY 7 DAYS) 12 capsule 0   atorvastatin (LIPITOR) 20 MG tablet TAKE ONE TABLET BY MOUTH ONCE DAILY 90 tablet 0   DULoxetine (CYMBALTA) 30 MG capsule Take 1 capsule (30 mg total) by mouth daily. For mood and neuropathy 30 capsule 6   gabapentin (NEURONTIN) 300 MG capsule Take 1 capsule (300 mg total) by mouth 2 (two) times daily. 60 capsule 6   lisinopril (ZESTRIL) 10 MG tablet Take 1 tablet (10 mg total) by mouth daily. 90 tablet 1   memantine (NAMENDA) 5 MG tablet Take 1 tablet (5 mg total) by mouth 2 (two) times daily. 60 tablet 6   pantoprazole (PROTONIX) 40 MG tablet TAKE 1 TABLET BY MOUTH ONCE DAILY 90 tablet 1   No facility-administered medications prior to visit.     Per HPI unless specifically indicated in ROS section below Review of Systems  Constitutional:  Positive for appetite change (decreased). Negative for activity change, chills, fatigue, fever and unexpected weight change.  HENT:  Negative for hearing loss.   Eyes:  Negative for visual disturbance.  Respiratory:  Positive for shortness of breath. Negative for cough,  chest tightness and wheezing.   Cardiovascular:  Positive for leg swelling (occ). Negative for chest pain and palpitations.  Gastrointestinal:  Negative for abdominal distention, abdominal pain, blood in stool, constipation, diarrhea, nausea and vomiting.  Genitourinary:  Negative for difficulty urinating and hematuria.  Musculoskeletal:  Negative for arthralgias, myalgias and neck pain.  Skin:  Negative for rash.  Neurological:  Negative for dizziness, seizures, syncope and headaches.  Hematological:  Negative for adenopathy. Bruises/bleeds easily.  Psychiatric/Behavioral:  Negative for dysphoric mood. The patient is not nervous/anxious.     Objective:  BP 124/66  Pulse 67   Temp 97.9 F (36.6 C) (Oral)   Ht 5\' 1"  (1.549 m)   Wt 147 lb (66.7 kg)   SpO2 96%   BMI 27.78 kg/m   Wt Readings from Last 3 Encounters:  01/29/23 147 lb (66.7 kg)  01/17/23 150 lb 9.6 oz (68.3 kg)  10/08/22 140 lb 2 oz (63.6 kg)      Physical Exam Vitals and nursing note reviewed.  Constitutional:      Appearance: Normal appearance. She is not ill-appearing.  HENT:     Head: Normocephalic and atraumatic.     Right Ear: Tympanic membrane, ear canal and external ear normal. There is no impacted cerumen.     Left Ear: Tympanic membrane, ear canal and external ear normal. There is no impacted cerumen.  Eyes:     General:        Right eye: No discharge.        Left eye: No discharge.     Extraocular Movements: Extraocular movements intact.     Conjunctiva/sclera: Conjunctivae normal.     Pupils: Pupils are equal, round, and reactive to light.  Neck:     Thyroid: No thyroid mass or thyromegaly.  Cardiovascular:     Rate and Rhythm: Normal rate and regular rhythm.     Pulses: Normal pulses.     Heart sounds: Normal heart sounds. No murmur heard. Pulmonary:     Effort: Pulmonary effort is normal. No respiratory distress.     Breath sounds: Normal breath sounds. No wheezing, rhonchi or rales.   Abdominal:     General: Bowel sounds are normal. There is no distension.     Palpations: Abdomen is soft. There is no mass.     Tenderness: There is no abdominal tenderness. There is no guarding or rebound.     Hernia: No hernia is present.  Musculoskeletal:     Cervical back: Normal range of motion and neck supple. No rigidity.     Right lower leg: No edema.     Left lower leg: No edema.  Lymphadenopathy:     Cervical: No cervical adenopathy.  Skin:    General: Skin is warm and dry.     Findings: No rash.  Neurological:     General: No focal deficit present.     Mental Status: She is alert. Mental status is at baseline.     Comments:  Unable to tell date Recall 1/3, 3/3 with cue  Psychiatric:        Mood and Affect: Mood normal.        Behavior: Behavior normal.       Results for orders placed or performed in visit on 01/29/23  Lipid panel  Result Value Ref Range   Cholesterol 140 0 - 200 mg/dL   Triglycerides 696.2 (H) 0.0 - 149.0 mg/dL   HDL 95.28 >41.32 mg/dL   VLDL 44.0 0.0 - 10.2 mg/dL   LDL Cholesterol 53 0 - 99 mg/dL   Total CHOL/HDL Ratio 3    NonHDL 91.36   Comprehensive metabolic panel  Result Value Ref Range   Sodium 144 135 - 145 mEq/L   Potassium 4.2 3.5 - 5.1 mEq/L   Chloride 103 96 - 112 mEq/L   CO2 33 (H) 19 - 32 mEq/L   Glucose, Bld 120 (H) 70 - 99 mg/dL   BUN 16 6 - 23 mg/dL   Creatinine, Ser 7.25 0.40 - 1.20 mg/dL   Total Bilirubin 0.5 0.2 - 1.2  mg/dL   Alkaline Phosphatase 60 39 - 117 U/L   AST 15 0 - 37 U/L   ALT 12 0 - 35 U/L   Total Protein 6.1 6.0 - 8.3 g/dL   Albumin 4.1 3.5 - 5.2 g/dL   GFR 40.98 (L) >11.91 mL/min   Calcium 9.1 8.4 - 10.5 mg/dL  Phosphorus  Result Value Ref Range   Phosphorus 4.5 2.3 - 4.6 mg/dL  VITAMIN D 25 Hydroxy (Vit-D Deficiency, Fractures)  Result Value Ref Range   VITD 74.44 30.00 - 100.00 ng/mL  Microalbumin / creatinine urine ratio  Result Value Ref Range   Microalb, Ur 1.0 0.0 - 1.9 mg/dL    Creatinine,U 478.2 mg/dL   Microalb Creat Ratio 0.9 0.0 - 30.0 mg/g  Parathyroid hormone, intact (no Ca)  Result Value Ref Range   PTH 280 (H) 16 - 77 pg/mL  CBC with Differential/Platelet  Result Value Ref Range   WBC 6.2 4.0 - 10.5 K/uL   RBC 4.60 3.87 - 5.11 Mil/uL   Hemoglobin 14.0 12.0 - 15.0 g/dL   HCT 95.6 21.3 - 08.6 %   MCV 94.0 78.0 - 100.0 fl   MCHC 32.3 30.0 - 36.0 g/dL   RDW 57.8 (H) 46.9 - 62.9 %   Platelets 253.0 150.0 - 400.0 K/uL   Neutrophils Relative % 58.1 43.0 - 77.0 %   Lymphocytes Relative 30.1 12.0 - 46.0 %   Monocytes Relative 7.8 3.0 - 12.0 %   Eosinophils Relative 2.2 0.0 - 5.0 %   Basophils Relative 1.8 0.0 - 3.0 %   Neutro Abs 3.6 1.4 - 7.7 K/uL   Lymphs Abs 1.9 0.7 - 4.0 K/uL   Monocytes Absolute 0.5 0.1 - 1.0 K/uL   Eosinophils Absolute 0.1 0.0 - 0.7 K/uL   Basophils Absolute 0.1 0.0 - 0.1 K/uL  Vitamin B12  Result Value Ref Range   Vitamin B-12 194 (L) 211 - 911 pg/mL  Ferritin  Result Value Ref Range   Ferritin 67.6 10.0 - 291.0 ng/mL  IBC panel  Result Value Ref Range   Iron 74 42 - 145 ug/dL   Transferrin 528.4 132.4 - 360.0 mg/dL   Saturation Ratios 40.1 20.0 - 50.0 %   TIBC 334.6 250.0 - 450.0 mcg/dL   *Note: Due to a large number of results and/or encounters for the requested time period, some results have not been displayed. A complete set of results can be found in Results Review.    Lab Results  Component Value Date   TSH 2.271 02/15/2022    Assessment & Plan:   Problem List Items Addressed This Visit     Medicare annual wellness visit, subsequent - Primary (Chronic)    I have personally reviewed the Medicare Annual Wellness questionnaire and have noted 1. The patient's medical and social history 2. Their use of alcohol, tobacco or illicit drugs 3. Their current medications and supplements 4. The patient's functional ability including ADL's, fall risks, home safety risks and hearing or visual impairment. Cognitive function  has been assessed and addressed as indicated.  5. Diet and physical activity 6. Evidence for depression or mood disorders The patients weight, height, BMI have been recorded in the chart. I have made referrals, counseling and provided education to the patient based on review of the above and I have provided the pt with a written personalized care plan for preventive services. Provider list updated.. See scanned questionairre as needed for further documentation. Reviewed preventative protocols and updated unless  pt declined.       Health maintenance examination (Chronic)    Preventative protocols reviewed and updated unless pt declined. Discussed healthy diet and lifestyle.       Advanced care planning/counseling discussion (Chronic)    Previously discussed      DNR (do not resuscitate) (Chronic)   HLD (hyperlipidemia)    Chronic, stable period on atorvastatin - continue. The ASCVD Risk score (Arnett DK, et al., 2019) failed to calculate for the following reasons:   The 2019 ASCVD risk score is only valid for ages 72 to 68       Relevant Medications   atorvastatin (LIPITOR) 20 MG tablet   lisinopril (ZESTRIL) 10 MG tablet   Other Relevant Orders   Lipid panel (Completed)   Comprehensive metabolic panel (Completed)   MDD (major depressive disorder), recurrent episode (HCC)    Continues duloxetine 30mg  daily with benefit.       Relevant Medications   DULoxetine (CYMBALTA) 30 MG capsule   Essential hypertension    Chronic, stable. Continue current regimen.       Relevant Medications   atorvastatin (LIPITOR) 20 MG tablet   lisinopril (ZESTRIL) 10 MG tablet   Osteoporosis    Discussed options  - fosamax vs prolia.  She has been on prolia since 08/2017, last injection 07/2022. Will re-certify.       RLS (restless legs syndrome)    This is overall improved - on requip 2mg  BID, gabapentin 300mg  BID, and oral iron replacement.       Persistent atrial fibrillation (HCC)     Continues eliquis and metoprolol succinate.       Relevant Medications   atorvastatin (LIPITOR) 20 MG tablet   lisinopril (ZESTRIL) 10 MG tablet   Abdominal aortic atherosclerosis (HCC)    Continue statin.       Relevant Medications   atorvastatin (LIPITOR) 20 MG tablet   lisinopril (ZESTRIL) 10 MG tablet   General unsteadiness    Notes decreased stamina, now regularly using transport chair.       Chronic constipation    Stable period on miralax.       Peripheral neuropathy    Continues gabapentin 300mg  BID       Relevant Medications   DULoxetine (CYMBALTA) 30 MG capsule   memantine (NAMENDA) 5 MG tablet   gabapentin (NEURONTIN) 300 MG capsule   Vitamin D deficiency    Update levels on weekly replacement.       Vitamin B12 deficiency    Update levels off replacement.       Relevant Orders   Vitamin B12 (Completed)   PAD (peripheral artery disease) (HCC)   Relevant Medications   atorvastatin (LIPITOR) 20 MG tablet   lisinopril (ZESTRIL) 10 MG tablet   CKD (chronic kidney disease) stage 3, GFR 30-59 ml/min (HCC)    Update renal function.       Relevant Orders   Comprehensive metabolic panel (Completed)   Phosphorus (Completed)   VITAMIN D 25 Hydroxy (Vit-D Deficiency, Fractures) (Completed)   Microalbumin / creatinine urine ratio (Completed)   Parathyroid hormone, intact (no Ca) (Completed)   CBC with Differential/Platelet (Completed)   Chronic heart failure with preserved ejection fraction (HFpEF) (HCC)    Stable period on lasix 20mg  daily.       Relevant Medications   atorvastatin (LIPITOR) 20 MG tablet   lisinopril (ZESTRIL) 10 MG tablet   MCI (mild cognitive impairment) with memory loss    Stable period on namenda  5mg  BID.  Avoid aricept in h/o gastric ulcer.       Relevant Medications   memantine (NAMENDA) 5 MG tablet   IDA (iron deficiency anemia)    Continue oral iron twice weekly.       Relevant Orders   Ferritin (Completed)   IBC panel  (Completed)   GERD without esophagitis    Continues pantoprazole.       Relevant Medications   pantoprazole (PROTONIX) 40 MG tablet   Pressure in head    Stable period - overall improved with infrequent symptoms.       Relevant Medications   DULoxetine (CYMBALTA) 30 MG capsule   gabapentin (NEURONTIN) 300 MG capsule   Other Visit Diagnoses     Encounter for immunization       Relevant Orders   Flu Vaccine Trivalent High Dose (Fluad) (Completed)        Meds ordered this encounter  Medications   atorvastatin (LIPITOR) 20 MG tablet    Sig: Take 1 tablet (20 mg total) by mouth daily.    Dispense:  90 tablet    Refill:  4   DULoxetine (CYMBALTA) 30 MG capsule    Sig: Take 1 capsule (30 mg total) by mouth daily. For mood and neuropathy    Dispense:  30 capsule    Refill:  12   memantine (NAMENDA) 5 MG tablet    Sig: Take 1 tablet (5 mg total) by mouth 2 (two) times daily.    Dispense:  60 tablet    Refill:  12   pantoprazole (PROTONIX) 40 MG tablet    Sig: Take 1 tablet (40 mg total) by mouth daily.    Dispense:  90 tablet    Refill:  4   gabapentin (NEURONTIN) 300 MG capsule    Sig: Take 1 capsule (300 mg total) by mouth 2 (two) times daily.    Dispense:  180 capsule    Refill:  3   lisinopril (ZESTRIL) 10 MG tablet    Sig: Take 1 tablet (10 mg total) by mouth daily.    Dispense:  90 tablet    Refill:  4    Orders Placed This Encounter  Procedures   Flu Vaccine Trivalent High Dose (Fluad)   Lipid panel   Comprehensive metabolic panel   Phosphorus   VITAMIN D 25 Hydroxy (Vit-D Deficiency, Fractures)   Microalbumin / creatinine urine ratio   Parathyroid hormone, intact (no Ca)   CBC with Differential/Platelet   Vitamin B12   Ferritin   IBC panel    Patient Instructions  Flu shot today  Labs today  We will work on getting next Prolia injection scheduled.  Medicines refilled  You are doing well today! Return as needed or in 6 months for follow up visit    Follow up plan: Return in about 6 months (around 07/30/2023) for follow up visit.  Eustaquio Boyden, MD

## 2023-01-29 NOTE — Assessment & Plan Note (Signed)
Discussed options  - fosamax vs prolia.  She has been on prolia since 08/2017, last injection 07/2022. Will re-certify.

## 2023-01-29 NOTE — Patient Instructions (Addendum)
Flu shot today  Labs today  We will work on getting next Prolia injection scheduled.  Medicines refilled  You are doing well today! Return as needed or in 6 months for follow up visit

## 2023-01-29 NOTE — Assessment & Plan Note (Signed)

## 2023-01-30 LAB — LIPID PANEL
Cholesterol: 140 mg/dL (ref 0–200)
HDL: 48.5 mg/dL (ref >39.00)
LDL Cholesterol: 53 mg/dL (ref 0–99)
NonHDL: 91.36
Total CHOL/HDL Ratio: 3
Triglycerides: 192 mg/dL — ABNORMAL HIGH (ref 0.0–149.0)
VLDL: 38.4 mg/dL (ref 0.0–40.0)

## 2023-01-30 LAB — MICROALBUMIN / CREATININE URINE RATIO
Creatinine,U: 112.3 mg/dL
Microalb Creat Ratio: 0.9 mg/g (ref 0.0–30.0)
Microalb, Ur: 1 mg/dL (ref 0.0–1.9)

## 2023-01-30 LAB — COMPREHENSIVE METABOLIC PANEL
ALT: 12 U/L (ref 0–35)
AST: 15 U/L (ref 0–37)
Albumin: 4.1 g/dL (ref 3.5–5.2)
Alkaline Phosphatase: 60 U/L (ref 39–117)
BUN: 16 mg/dL (ref 6–23)
CO2: 33 meq/L — ABNORMAL HIGH (ref 19–32)
Calcium: 9.1 mg/dL (ref 8.4–10.5)
Chloride: 103 meq/L (ref 96–112)
Creatinine, Ser: 1.08 mg/dL (ref 0.40–1.20)
GFR: 44.86 mL/min — ABNORMAL LOW (ref >60.00)
Glucose, Bld: 120 mg/dL — ABNORMAL HIGH (ref 70–99)
Potassium: 4.2 meq/L (ref 3.5–5.1)
Sodium: 144 meq/L (ref 135–145)
Total Bilirubin: 0.5 mg/dL (ref 0.2–1.2)
Total Protein: 6.1 g/dL (ref 6.0–8.3)

## 2023-01-30 LAB — FERRITIN: Ferritin: 67.6 ng/mL (ref 10.0–291.0)

## 2023-01-30 LAB — CBC WITH DIFFERENTIAL/PLATELET
Basophils Absolute: 0.1 10*3/uL (ref 0.0–0.1)
Basophils Relative: 1.8 % (ref 0.0–3.0)
Eosinophils Absolute: 0.1 10*3/uL (ref 0.0–0.7)
Eosinophils Relative: 2.2 % (ref 0.0–5.0)
HCT: 43.3 % (ref 36.0–46.0)
Hemoglobin: 14 g/dL (ref 12.0–15.0)
Lymphocytes Relative: 30.1 % (ref 12.0–46.0)
Lymphs Abs: 1.9 10*3/uL (ref 0.7–4.0)
MCHC: 32.3 g/dL (ref 30.0–36.0)
MCV: 94 fL (ref 78.0–100.0)
Monocytes Absolute: 0.5 10*3/uL (ref 0.1–1.0)
Monocytes Relative: 7.8 % (ref 3.0–12.0)
Neutro Abs: 3.6 10*3/uL (ref 1.4–7.7)
Neutrophils Relative %: 58.1 % (ref 43.0–77.0)
Platelets: 253 10*3/uL (ref 150.0–400.0)
RBC: 4.6 Mil/uL (ref 3.87–5.11)
RDW: 16.3 % — ABNORMAL HIGH (ref 11.5–15.5)
WBC: 6.2 10*3/uL (ref 4.0–10.5)

## 2023-01-30 LAB — PHOSPHORUS: Phosphorus: 4.5 mg/dL (ref 2.3–4.6)

## 2023-01-30 LAB — IBC PANEL
Iron: 74 ug/dL (ref 42–145)
Saturation Ratios: 22.1 % (ref 20.0–50.0)
TIBC: 334.6 ug/dL (ref 250.0–450.0)
Transferrin: 239 mg/dL (ref 212.0–360.0)

## 2023-01-30 LAB — PARATHYROID HORMONE, INTACT (NO CA): PTH: 280 pg/mL — ABNORMAL HIGH (ref 16–77)

## 2023-01-30 LAB — VITAMIN D 25 HYDROXY (VIT D DEFICIENCY, FRACTURES): VITD: 74.44 ng/mL (ref 30.00–100.00)

## 2023-01-30 LAB — VITAMIN B12: Vitamin B-12: 194 pg/mL — ABNORMAL LOW (ref 211–911)

## 2023-02-01 ENCOUNTER — Telehealth: Payer: Self-pay | Admitting: Family Medicine

## 2023-02-01 ENCOUNTER — Encounter: Payer: Self-pay | Admitting: Family Medicine

## 2023-02-01 NOTE — Assessment & Plan Note (Signed)
Continue oral iron twice weekly.

## 2023-02-01 NOTE — Assessment & Plan Note (Signed)
Continue statin. 

## 2023-02-01 NOTE — Assessment & Plan Note (Signed)
Update renal function.  

## 2023-02-01 NOTE — Assessment & Plan Note (Signed)
Update levels off replacement. 

## 2023-02-01 NOTE — Telephone Encounter (Signed)
Can wprice prolia out for patient? thanks

## 2023-02-01 NOTE — Assessment & Plan Note (Signed)
Stable period on lasix 20mg  daily.

## 2023-02-01 NOTE — Assessment & Plan Note (Signed)
Chronic, stable period on atorvastatin - continue. The ASCVD Risk score (Arnett DK, et al., 2019) failed to calculate for the following reasons:   The 2019 ASCVD risk score is only valid for ages 54 to 95

## 2023-02-01 NOTE — Assessment & Plan Note (Signed)
Continues duloxetine 30mg  daily with benefit.

## 2023-02-01 NOTE — Assessment & Plan Note (Signed)
Update levels on weekly replacement.  

## 2023-02-01 NOTE — Assessment & Plan Note (Signed)
Continues eliquis and metoprolol succinate.

## 2023-02-01 NOTE — Assessment & Plan Note (Signed)
Notes decreased stamina, now regularly using transport chair.

## 2023-02-01 NOTE — Assessment & Plan Note (Signed)
Stable period on namenda 5mg  BID.  Avoid aricept in h/o gastric ulcer.

## 2023-02-01 NOTE — Assessment & Plan Note (Signed)
Continues gabapentin 300mg  BID

## 2023-02-01 NOTE — Assessment & Plan Note (Signed)
This is overall improved - on requip 2mg  BID, gabapentin 300mg  BID, and oral iron replacement.

## 2023-02-01 NOTE — Assessment & Plan Note (Signed)
Chronic, stable. Continue current regimen. 

## 2023-02-01 NOTE — Assessment & Plan Note (Signed)
Continues pantoprazole.

## 2023-02-01 NOTE — Assessment & Plan Note (Signed)
Stable period - overall improved with infrequent symptoms.

## 2023-02-01 NOTE — Assessment & Plan Note (Signed)
Previously discussed.

## 2023-02-01 NOTE — Assessment & Plan Note (Signed)
Preventative protocols reviewed and updated unless pt declined. Discussed healthy diet and lifestyle.  

## 2023-02-01 NOTE — Assessment & Plan Note (Signed)
Stable period on miralax.

## 2023-02-04 ENCOUNTER — Encounter: Payer: Self-pay | Admitting: Family Medicine

## 2023-02-04 ENCOUNTER — Other Ambulatory Visit: Payer: Self-pay | Admitting: Family Medicine

## 2023-02-04 DIAGNOSIS — N2581 Secondary hyperparathyroidism of renal origin: Secondary | ICD-10-CM | POA: Insufficient documentation

## 2023-02-04 MED ORDER — VITAMIN B-12 1000 MCG PO TABS: 1000.0000 ug | ORAL_TABLET | Freq: Every day | ORAL | Status: DC

## 2023-02-04 MED ORDER — DENOSUMAB 60 MG/ML ~~LOC~~ SOSY: 60.0000 mg | PREFILLED_SYRINGE | Freq: Once | SUBCUTANEOUS | Status: DC

## 2023-02-04 NOTE — Addendum Note (Signed)
Addended by: Donnamarie Poag on: 02/04/2023 03:21 PM   Modules accepted: Orders

## 2023-02-04 NOTE — Telephone Encounter (Signed)
Order started and sent for auth. Will contact patient once information received.

## 2023-02-05 ENCOUNTER — Ambulatory Visit: Payer: Medicare Other | Admitting: Dermatology

## 2023-02-05 ENCOUNTER — Encounter: Payer: Self-pay | Admitting: Dermatology

## 2023-02-05 DIAGNOSIS — D0462 Carcinoma in situ of skin of left upper limb, including shoulder: Secondary | ICD-10-CM

## 2023-02-05 DIAGNOSIS — D492 Neoplasm of unspecified behavior of bone, soft tissue, and skin: Secondary | ICD-10-CM

## 2023-02-05 DIAGNOSIS — C44529 Squamous cell carcinoma of skin of other part of trunk: Secondary | ICD-10-CM | POA: Diagnosis not present

## 2023-02-05 DIAGNOSIS — L821 Other seborrheic keratosis: Secondary | ICD-10-CM

## 2023-02-05 DIAGNOSIS — D099 Carcinoma in situ, unspecified: Secondary | ICD-10-CM

## 2023-02-05 DIAGNOSIS — D485 Neoplasm of uncertain behavior of skin: Secondary | ICD-10-CM

## 2023-02-05 HISTORY — DX: Carcinoma in situ, unspecified: D09.9

## 2023-02-05 NOTE — Patient Instructions (Signed)
Wound Care Instructions  Cleanse wound gently with soap and water once a day then pat dry with clean gauze. Apply a thin coat of Petrolatum (petroleum jelly, "Vaseline") over the wound (unless you have an allergy to this). We recommend that you use a new, sterile tube of Vaseline. Do not pick or remove scabs. Do not remove the yellow or white "healing tissue" from the base of the wound.  Cover the wound with fresh, clean, nonstick gauze and secure with paper tape. You may use Band-Aids in place of gauze and tape if the wound is small enough, but would recommend trimming much of the tape off as there is often too much. Sometimes Band-Aids can irritate the skin.  You should call the office for your biopsy report after 1 week if you have not already been contacted.  If you experience any problems, such as abnormal amounts of bleeding, swelling, significant bruising, significant pain, or evidence of infection, please call the office immediately.  FOR ADULT SURGERY PATIENTS: If you need something for pain relief you may take 1 extra strength Tylenol (acetaminophen) AND 2 Ibuprofen (200mg  each) together every 4 hours as needed for pain. (do not take these if you are allergic to them or if you have a reason you should not take them.) Typically, you may only need pain medication for 1 to 3 days.     Due to recent changes in healthcare laws, you may see results of your pathology and/or laboratory studies on MyChart before the doctors have had a chance to review them. We understand that in some cases there may be results that are confusing or concerning to you. Please understand that not all results are received at the same time and often the doctors may need to interpret multiple results in order to provide you with the best plan of care or course of treatment. Therefore, we ask that you please give [place] 2 business days to thoroughly review all your results before contacting the office for clarification. Should  we see a critical lab result, you will be contacted sooner.   If You Need Anything After Your Visit  If you have any questions or concerns for your doctor, please call our main line at [phone] and press option 4 to reach your doctor's medical assistant. If no one answers, please leave a voicemail as directed and we will return your call as soon as possible. Messages left after 4 pm will be answered the following business day.   You may also send [place] a message via MyChart. We typically respond to MyChart messages within 1-2 business days.  For prescription refills, please ask your pharmacy to contact our office. Our fax number is [phone].  If you have an urgent issue when the clinic is closed that cannot wait until the next business day, you can page your doctor at the number below.    Please note that while we do our best to be available for urgent issues outside of office hours, we are not available 24/7.   If you have an urgent issue and are unable to reach [place], you may choose to seek medical care at your doctor's office, retail clinic, urgent care center, or emergency room.  If you have a medical emergency, please immediately call 911 or go to the emergency department.  Pager Numbers  - Dr. Gwen Pounds: [phone]  - Dr. Roseanne Reno: [phone]  - Dr. Katrinka Blazing: [phone]   In the event of inclement weather, please call our main line  at [phone] for an update on the status of any delays or closures.  Dermatology Medication Tips: Please keep the boxes that topical medications come in in order to help keep track of the instructions about where and how to use these. Pharmacies typically print the medication instructions only on the boxes and not directly on the medication tubes.   If your medication is too expensive, please contact our office at [phone] option 4 or send [place] a message through MyChart.   We are unable to tell what your co-pay for medications will be in  advance as this is different depending on your insurance coverage. However, we may be able to find a substitute medication at lower cost or fill out paperwork to get insurance to cover a needed medication.   If a prior authorization is required to get your medication covered by your insurance company, please allow [place] 1-2 business days to complete this process.  Drug prices often vary depending on where the prescription is filled and some pharmacies may offer cheaper prices.  The website www.goodrx.com contains coupons for medications through different pharmacies. The prices here do not account for what the cost may be with help from insurance (it may be cheaper with your insurance), but the website can give you the price if you did not use any insurance.  - You can print the associated coupon and take it with your prescription to the pharmacy.  - You may also stop by our office during regular business hours and pick up a GoodRx coupon card.  - If you need your prescription sent electronically to a different pharmacy, notify our office through [facility] or by phone at [phone] option 4.     Si Usted Necesita Algo Despus de Su Visita  Tambin puede enviarnos un mensaje a travs de Clinical cytogeneticist. Por lo general respondemos a los mensajes de MyChart en el transcurso de 1 a 2 das hbiles.  Para renovar recetas, por favor pida a su farmacia que se ponga en contacto con nuestra oficina. [name] de fax es [place] [phone].  Si tiene un asunto urgente cuando la clnica est cerrada y que no puede esperar hasta el siguiente da hbil, puede llamar/localizar a su doctor(a) al nmero que aparece a continuacin.   Por favor, tenga en cuenta que aunque hacemos todo lo posible para estar disponibles para asuntos urgentes fuera del horario de [place], no estamos disponibles las 24 horas del da, los 7 [address]  [address] de la [place].   Si tiene un problema urgente y no puede comunicarse con nosotros, puede  optar por buscar atencin mdica  en el consultorio de su doctor(a), en una clnica privada, en un centro de atencin urgente o en una sala de emergencias.  Si tiene Engineer, drilling, por favor llame inmediatamente al 911 o vaya a la sala de emergencias.  Nmeros de bper  - Dr. Gwen Pounds: [phone]  - Dra. Roseanne Reno: [phone]  - Dr. Katrinka Blazing: [phone]   En caso de inclemencias del tiempo, por favor llame a Lacy Duverney principal al [phone] para una actualizacin sobre el [place] de cualquier retraso o cierre.  Consejos para la medicacin en dermatologa: Por favor, guarde las cajas en las que vienen los medicamentos de uso tpico para ayudarle a seguir las instrucciones sobre dnde y cmo usarlos. Las farmacias generalmente imprimen las instrucciones del medicamento slo en las cajas y no directamente en los tubos del [place].   Si su medicamento es [employer], por favor, pngase en  contacto con Rolm Gala llamando al [phone] y presione la opcin 4 o envenos un mensaje a travs de Clinical cytogeneticist.   No podemos decirle cul ser su copago por los medicamentos por adelantado ya que esto es diferente dependiendo de la cobertura de su seguro. Sin embargo, es posible que podamos encontrar un medicamento sustituto a Audiological scientist un formulario para que el seguro cubra el medicamento que se considera necesario.   Si se requiere una autorizacin previa para que su compaa de seguros [place] su medicamento, por favor permtanos de 1 a 2 das hbiles para completar [address]  Los precios de los medicamentos varan con frecuencia dependiendo del Environmental consultant de dnde se surte la receta y alguna farmacias pueden ofrecer precios ms baratos.  El sitio web www.goodrx.com tiene cupones para medicamentos de Health and safety inspector. Los precios aqu no tienen en cuenta lo que podra costar con la ayuda del seguro (puede ser ms barato con su seguro), pero el sitio web puede darle el  precio si no utiliz Tourist information centre manager.  - Puede imprimir el cupn correspondiente y llevarlo con su receta a la farmacia.  - Tambin puede pasar por nuestra oficina durante el horario de atencin regular y Education officer, museum una tarjeta de cupones de GoodRx.  - Si necesita que su receta se enve electrnicamente a una farmacia diferente, informe a nuestra oficina a travs de MyChart de [place] o por telfono llamando al [phone] y presione la opcin 4.

## 2023-02-05 NOTE — Progress Notes (Signed)
   Follow-Up Visit   Subjective  [name] is a 89 y.o. female who presents for the following: spots at left upper back and left forearm. Spot at left upper back has gotten larger and patient scratches at it. Color has changed over the last day. Patient with a dry spot under right eye.  The patient has spots, moles and lesions to be evaluated, some may be new or changing and the patient may have concern these could be cancer.  Patient accompanied by daughter, [name].   The following portions of the chart were reviewed this encounter and updated as appropriate: medications, allergies, medical history  Review of Systems:  No other skin or systemic complaints except as noted in HPI or Assessment and Plan.  Objective  Well appearing patient in no apparent distress; mood and affect are within normal limits.   A focused examination was performed of the following areas: Face, back, arms  Relevant exam findings are noted in the Assessment and Plan.  Left Forearm - Anterior 5 mm Keratotic papule (cutaneous horn)       Left Upper Back 11 mm keratotic purpuric plaque with horn         Assessment & Plan     Neoplasm of uncertain behavior of skin (2) Left Forearm - Anterior  Skin / nail biopsy Type of biopsy: tangential   Informed consent: discussed and consent obtained   Timeout: patient name, date of birth, surgical site, and procedure verified   Procedure prep:  Patient was prepped and draped in usual sterile fashion Prep type:  Isopropyl alcohol Anesthesia: the lesion was anesthetized in a standard fashion   Anesthetic:  1% lidocaine w/ epinephrine 1-100,000 buffered w/ 8.4% NaHCO3 Instrument used: DermaBlade   Hemostasis achieved with: pressure and aluminum chloride   Outcome: patient tolerated procedure well   Post-procedure details: sterile dressing applied and wound care instructions given   Dressing type: bandage and petrolatum    Specimen 1 -  Surgical pathology Differential Diagnosis: R/O SCC  Check Margins: No 5 mm Keratotic papule (cutaneous horn)  Left Upper Back  Skin / nail biopsy Type of biopsy: tangential   Informed consent: discussed and consent obtained   Timeout: patient name, date of birth, surgical site, and procedure verified   Procedure prep:  Patient was prepped and draped in usual sterile fashion Prep type:  Isopropyl alcohol Anesthesia: the lesion was anesthetized in a standard fashion   Anesthetic:  1% lidocaine w/ epinephrine 1-100,000 buffered w/ 8.4% NaHCO3 Instrument used: DermaBlade   Hemostasis achieved with: pressure and aluminum chloride   Outcome: patient tolerated procedure well   Post-procedure details: sterile dressing applied and wound care instructions given   Dressing type: bandage and petrolatum    Specimen 2 - Surgical pathology Differential Diagnosis: R/O SCC  Check Margins: No 11 mm keratotic purpuric plaque with horn  SEBORRHEIC KERATOSIS - Stuck-on, waxy, tan-brown papules and/or plaques  - Benign-appearing - Discussed benign etiology and prognosis. - Observe - Call for any changes   Return if symptoms worsen or fail to improve.  Anise Salvo, RMA, am acting as scribe for Elie Goody, MD .   Documentation: I have reviewed the above documentation for accuracy and completeness, and I agree with the above.  Elie Goody, MD

## 2023-02-10 DIAGNOSIS — M48062 Spinal stenosis, lumbar region with neurogenic claudication: Secondary | ICD-10-CM | POA: Diagnosis not present

## 2023-02-10 DIAGNOSIS — M7542 Impingement syndrome of left shoulder: Secondary | ICD-10-CM | POA: Diagnosis not present

## 2023-02-10 DIAGNOSIS — M62830 Muscle spasm of back: Secondary | ICD-10-CM | POA: Diagnosis not present

## 2023-02-10 DIAGNOSIS — M5416 Radiculopathy, lumbar region: Secondary | ICD-10-CM | POA: Diagnosis not present

## 2023-02-10 LAB — SURGICAL PATHOLOGY

## 2023-02-11 ENCOUNTER — Telehealth: Payer: Self-pay

## 2023-02-11 NOTE — Telephone Encounter (Signed)
Spoke with patient's daughter and advised pathology results and treatment recommendations. Patient's daughter would like to schedule surgery and EDC. First available surgery at 10:30 am is 04/23/23.

## 2023-02-11 NOTE — Telephone Encounter (Signed)
-----   Message from [facility] sent at 02/11/2023  7:10 AM EST ----- Diagnosis  1. Skin, left forearm-anterior :       SQUAMOUS CELL CARCINOMA IN SITU, HYPERTROPHIC, BASE INVOLVED        2. Skin, left upper back :       WELL DIFFERENTIATED SQUAMOUS CELL CARCINOMA, DEEP MARGIN INVOLVED    Please call with diagnosis and message me with patient's decision on treatment. ##If patient wants to do both on the same day (excision and EDC), then please schedule in an 11:30 excision slot on a day where that's the last patient of the morning##  1. Left forearm Explanation: This is a squamous cell skin cancer limited to the top layer of skin. This means it is an early cancer and has not spread. However, it has the potential to spread beyond the skin and threaten your health, so we recommend treating it.   Treatment option 1: a cream (fluorouracil and calcipotriene) that helps your immune system clear the skin cancer. It will cause redness and irritation. Wait two weeks after the biopsy to start applying the cream. Apply the cream twice per day until the redness and irritation develop (usually occurs by day 7), then stop and allow it to heal. We will recheck the area in 2 months to ensure the cancer is gone. The cream is $35 plus shipping and will be mailed to you from a low cost compounding pharmacy. If the skin cancer comes back, we would need to do a surgery to remove it.  Treatment option 2: you return for a brief appointment where I perform electrodesiccation and curettage [facility]). This involves three rounds of scraping and burning to destroy the skin cancer. It has about an 85% cure rate and leaves a round wound slightly larger than the skin cancer and leaves a round white scar. No additional pathology is done. If the skin cancer comes back, we would need to do a surgery to remove it.  2. Left upper back  Explanation: This is a squamous cell skin cancer that has grown beyond the surface of the skin  and is invading the second layer of the skin. It has the potential to spread beyond the skin and threaten your health, so I recommend treating it.  Treatment option 1: you return for an hour long appointment where we perform a skin surgery. We numb the site of the skin cancer and a safety margin of normal skin around it. We remove the full thickness of skin and close the wound with two layers of stitches. The sample is sent to the lab to check that the skin cancer was fully removed. Return one week later to have wound checked and surface stitches removed. Surgical wound leaves a line scar. Approximately 95% cure rate

## 2023-02-17 NOTE — Telephone Encounter (Signed)
Patient's daughter called back today to follow up.

## 2023-02-24 NOTE — Telephone Encounter (Signed)
LVM for patient's daughter to let us know if they could come in 11/20 at 11:30 am.  Butch Penny., RMA

## 2023-02-25 NOTE — Telephone Encounter (Signed)
Patient's daughter unable to bring her in tomorrow.  She asked if we could get her in next Wednesday or the first week of December? I will watch for cancellations as well.

## 2023-02-27 ENCOUNTER — Other Ambulatory Visit: Payer: Self-pay | Admitting: Family Medicine

## 2023-02-27 DIAGNOSIS — E559 Vitamin D deficiency, unspecified: Secondary | ICD-10-CM

## 2023-03-12 ENCOUNTER — Other Ambulatory Visit: Payer: Self-pay | Admitting: Cardiovascular Disease

## 2023-03-13 ENCOUNTER — Telehealth: Payer: Self-pay | Admitting: Cardiovascular Disease

## 2023-03-13 MED ORDER — METOPROLOL SUCCINATE ER 100 MG PO TB24: 100.0000 mg | ORAL_TABLET | Freq: Two times a day (BID) | ORAL | 0 refills | Status: DC

## 2023-03-13 NOTE — Telephone Encounter (Signed)
*  STAT* If patient is at the pharmacy, call can be transferred to refill team.   1. Which medications need to be refilled? (please list name of each medication and dose if known) ELIQUIS 5 MG TABS tablet   metoprolol succinate (TOPROL-XL) 100 MG 24 hr tablet   2. Which pharmacy/location (including street and city if local pharmacy) is medication to be sent to?  [facility] - [place], [place] - [address]    3. Do they need a 30 day or 90 day supply? 90

## 2023-03-13 NOTE — Telephone Encounter (Signed)
Refill Request.  

## 2023-03-13 NOTE — Telephone Encounter (Signed)
Requested Prescriptions   Signed Prescriptions Disp Refills   metoprolol succinate (TOPROL-XL) 100 MG 24 hr tablet 180 tablet 0    Sig: Take 1 tablet (100 mg total) by mouth in the morning and at bedtime.    Authorizing Provider: Lorine Bears A    Ordering User: Kendrick Fries

## 2023-03-14 ENCOUNTER — Other Ambulatory Visit: Payer: Self-pay

## 2023-03-14 MED ORDER — APIXABAN 5 MG PO TABS: 5.0000 mg | ORAL_TABLET | Freq: Two times a day (BID) | ORAL | 1 refills | Status: DC

## 2023-03-14 NOTE — Telephone Encounter (Signed)
Prescription refill request for Eliquis received. Indication:afib Last office visit:10/24 Scr:1.08  10/24 Age: 89 Weight:66.7  kg  Prescription refilled

## 2023-03-18 ENCOUNTER — Ambulatory Visit (INDEPENDENT_AMBULATORY_CARE_PROVIDER_SITE_OTHER): Payer: Medicare Other | Admitting: Dermatology

## 2023-03-18 ENCOUNTER — Encounter: Payer: Self-pay | Admitting: Dermatology

## 2023-03-18 DIAGNOSIS — D0462 Carcinoma in situ of skin of left upper limb, including shoulder: Secondary | ICD-10-CM | POA: Diagnosis not present

## 2023-03-18 DIAGNOSIS — C44529 Squamous cell carcinoma of skin of other part of trunk: Secondary | ICD-10-CM | POA: Diagnosis not present

## 2023-03-18 DIAGNOSIS — C4492 Squamous cell carcinoma of skin, unspecified: Secondary | ICD-10-CM

## 2023-03-18 DIAGNOSIS — D099 Carcinoma in situ, unspecified: Secondary | ICD-10-CM

## 2023-03-18 NOTE — Progress Notes (Signed)
   Follow-Up Visit   Subjective  [name] is a 89 y.o. female who presents for the following: Excision of bx proven SCC at left upper back  The following portions of the chart were reviewed this encounter and updated as appropriate: medications, allergies, medical history  Review of Systems:  No other skin or systemic complaints except as noted in HPI or Assessment and Plan.  Objective  Well appearing patient in no apparent distress; mood and affect are within normal limits.  A focused examination was performed of the following areas: Back/shoulder  Relevant physical exam findings are noted in the Assessment and Plan.   Left Upper Back Healing bx site  Left Forearm - Anterior Recurrent keratotic papule    Assessment & Plan   Squamous cell carcinoma of skin Left Upper Back  Well-healed on exam with no evidence of recurrence. Discussed options of observation vs EDC vs excision to confirm clearance of cancer. Jointly decided to recheck on follow up  Squamous cell carcinoma in situ Left Forearm - Anterior  Destruction of lesion  Destruction method: electrodesiccation and curettage   Informed consent: discussed and consent obtained   Timeout:  patient name, date of birth, surgical site, and procedure verified Anesthesia: the lesion was anesthetized in a standard fashion   Anesthetic:  1% lidocaine w/ epinephrine 1-100,000 buffered w/ 8.4% NaHCO3 Curettage performed in three different directions: Yes   Electrodesiccation performed over the curetted area: Yes   Curettage cycles:  1 Final wound size (cm):  1 Hemostasis achieved with:  electrodesiccation Outcome: patient tolerated procedure well with no complications   Post-procedure details: sterile dressing applied and wound care instructions given   Dressing type: petrolatum   Additional details:  One cycle of curettage and electrodesiccation due to significant purpura and atrophic epidermis/dermis     Return  in about 2 months (around 05/19/2023) for with Dr. Katrinka Blazing , Hx SCC.  Anise Salvo, RMA, am acting as scribe for Elie Goody, MD .   Documentation: I have reviewed the above documentation for accuracy and completeness, and I agree with the above.  Elie Goody, MD

## 2023-03-18 NOTE — Patient Instructions (Addendum)
Wound Care Instructions  Cleanse wound gently with soap and water once a day then pat dry with clean gauze. Apply a thin coat of Petrolatum (petroleum jelly, "Vaseline") over the wound (unless you have an allergy to this). We recommend that you use a new, sterile tube of Vaseline. Do not pick or remove scabs. Do not remove the yellow or white "healing tissue" from the base of the wound.  Cover the wound with fresh, clean, nonstick gauze and secure with paper tape. You may use Band-Aids in place of gauze and tape if the wound is small enough, but would recommend trimming much of the tape off as there is often too much. Sometimes Band-Aids can irritate the skin.  You should call the office for your biopsy report after 1 week if you have not already been contacted.  If you experience any problems, such as abnormal amounts of bleeding, swelling, significant bruising, significant pain, or evidence of infection, please call the office immediately.  FOR ADULT SURGERY PATIENTS: If you need something for pain relief you may take 1 extra strength Tylenol (acetaminophen) AND 2 Ibuprofen (200mg  each) together every 4 hours as needed for pain. (do not take these if you are allergic to them or if you have a reason you should not take them.) Typically, you may only need pain medication for 1 to 3 days.     Due to recent changes in healthcare laws, you may see results of your pathology and/or laboratory studies on MyChart before the doctors have had a chance to review them. We understand that in some cases there may be results that are confusing or concerning to you. Please understand that not all results are received at the same time and often the doctors may need to interpret multiple results in order to provide you with the best plan of care or course of treatment. Therefore, we ask that you please give [place] 2 business days to thoroughly review all your results before contacting the office for clarification. Should  we see a critical lab result, you will be contacted sooner.   If You Need Anything After Your Visit  If you have any questions or concerns for your doctor, please call our main line at [phone] and press option 4 to reach your doctor's medical assistant. If no one answers, please leave a voicemail as directed and we will return your call as soon as possible. Messages left after 4 pm will be answered the following business day.   You may also send [place] a message via MyChart. We typically respond to MyChart messages within 1-2 business days.  For prescription refills, please ask your pharmacy to contact our office. Our fax number is [phone].  If you have an urgent issue when the clinic is closed that cannot wait until the next business day, you can page your doctor at the number below.    Please note that while we do our best to be available for urgent issues outside of office hours, we are not available 24/7.   If you have an urgent issue and are unable to reach [place], you may choose to seek medical care at your doctor's office, retail clinic, urgent care center, or emergency room.  If you have a medical emergency, please immediately call 911 or go to the emergency department.  Pager Numbers  - Dr. Gwen Pounds: [phone]  - Dr. Roseanne Reno: [phone]  - Dr. Katrinka Blazing: [phone]   In the event of inclement weather, please call our main line  at [phone] for an update on the status of any delays or closures.  Dermatology Medication Tips: Please keep the boxes that topical medications come in in order to help keep track of the instructions about where and how to use these. Pharmacies typically print the medication instructions only on the boxes and not directly on the medication tubes.   If your medication is too expensive, please contact our office at [phone] option 4 or send [place] a message through MyChart.   We are unable to tell what your co-pay for medications will be in  advance as this is different depending on your insurance coverage. However, we may be able to find a substitute medication at lower cost or fill out paperwork to get insurance to cover a needed medication.   If a prior authorization is required to get your medication covered by your insurance company, please allow [place] 1-2 business days to complete this process.  Drug prices often vary depending on where the prescription is filled and some pharmacies may offer cheaper prices.  The website www.goodrx.com contains coupons for medications through different pharmacies. The prices here do not account for what the cost may be with help from insurance (it may be cheaper with your insurance), but the website can give you the price if you did not use any insurance.  - You can print the associated coupon and take it with your prescription to the pharmacy.  - You may also stop by our office during regular business hours and pick up a GoodRx coupon card.  - If you need your prescription sent electronically to a different pharmacy, notify our office through [facility] or by phone at [phone] option 4.     Si Usted Necesita Algo Despus de Su Visita  Tambin puede enviarnos un mensaje a travs de Clinical cytogeneticist. Por lo general respondemos a los mensajes de MyChart en el transcurso de 1 a 2 das hbiles.  Para renovar recetas, por favor pida a su farmacia que se ponga en contacto con nuestra oficina. [name] de fax es [place] [phone].  Si tiene un asunto urgente cuando la clnica est cerrada y que no puede esperar hasta el siguiente da hbil, puede llamar/localizar a su doctor(a) al nmero que aparece a continuacin.   Por favor, tenga en cuenta que aunque hacemos todo lo posible para estar disponibles para asuntos urgentes fuera del horario de [place], no estamos disponibles las 24 horas del da, los 7 [address]  [address] de la [place].   Si tiene un problema urgente y no puede comunicarse con nosotros, puede  optar por buscar atencin mdica  en el consultorio de su doctor(a), en una clnica privada, en un centro de atencin urgente o en una sala de emergencias.  Si tiene Engineer, drilling, por favor llame inmediatamente al 911 o vaya a la sala de emergencias.  Nmeros de bper  - Dr. Gwen Pounds: [phone]  - Dra. Roseanne Reno: [phone]  - Dr. Katrinka Blazing: [phone]   En caso de inclemencias del tiempo, por favor llame a Lacy Duverney principal al [phone] para una actualizacin sobre el [place] de cualquier retraso o cierre.  Consejos para la medicacin en dermatologa: Por favor, guarde las cajas en las que vienen los medicamentos de uso tpico para ayudarle a seguir las instrucciones sobre dnde y cmo usarlos. Las farmacias generalmente imprimen las instrucciones del medicamento slo en las cajas y no directamente en los tubos del [place].   Si su medicamento es [employer], por favor, pngase en  contacto con Rolm Gala llamando al [phone] y presione la opcin 4 o envenos un mensaje a travs de Clinical cytogeneticist.   No podemos decirle cul ser su copago por los medicamentos por adelantado ya que esto es diferente dependiendo de la cobertura de su seguro. Sin embargo, es posible que podamos encontrar un medicamento sustituto a Audiological scientist un formulario para que el seguro cubra el medicamento que se considera necesario.   Si se requiere una autorizacin previa para que su compaa de seguros [place] su medicamento, por favor permtanos de 1 a 2 das hbiles para completar [address]  Los precios de los medicamentos varan con frecuencia dependiendo del Environmental consultant de dnde se surte la receta y alguna farmacias pueden ofrecer precios ms baratos.  El sitio web www.goodrx.com tiene cupones para medicamentos de Health and safety inspector. Los precios aqu no tienen en cuenta lo que podra costar con la ayuda del seguro (puede ser ms barato con su seguro), pero el sitio web puede darle el  precio si no utiliz Tourist information centre manager.  - Puede imprimir el cupn correspondiente y llevarlo con su receta a la farmacia.  - Tambin puede pasar por nuestra oficina durante el horario de atencin regular y Education officer, museum una tarjeta de cupones de GoodRx.  - Si necesita que su receta se enve electrnicamente a una farmacia diferente, informe a nuestra oficina a travs de MyChart de [place] o por telfono llamando al [phone] y presione la opcin 4.

## 2023-03-26 ENCOUNTER — Ambulatory Visit: Payer: Medicare Other | Admitting: Nurse Practitioner

## 2023-04-16 ENCOUNTER — Ambulatory Visit: Payer: Medicare Other | Attending: Nurse Practitioner | Admitting: Nurse Practitioner

## 2023-04-16 ENCOUNTER — Encounter: Payer: Self-pay | Admitting: Nurse Practitioner

## 2023-04-16 VITALS — BP 132/76 | HR 64 | Ht 63.0 in | Wt 147.0 lb

## 2023-04-16 DIAGNOSIS — I10 Essential (primary) hypertension: Secondary | ICD-10-CM | POA: Diagnosis not present

## 2023-04-16 DIAGNOSIS — I4821 Permanent atrial fibrillation: Secondary | ICD-10-CM

## 2023-04-16 DIAGNOSIS — I2510 Atherosclerotic heart disease of native coronary artery without angina pectoris: Secondary | ICD-10-CM

## 2023-04-16 DIAGNOSIS — E785 Hyperlipidemia, unspecified: Secondary | ICD-10-CM | POA: Diagnosis not present

## 2023-04-16 DIAGNOSIS — I5032 Chronic diastolic (congestive) heart failure: Secondary | ICD-10-CM

## 2023-04-16 DIAGNOSIS — I340 Nonrheumatic mitral (valve) insufficiency: Secondary | ICD-10-CM | POA: Diagnosis not present

## 2023-04-16 DIAGNOSIS — I739 Peripheral vascular disease, unspecified: Secondary | ICD-10-CM

## 2023-04-16 NOTE — Progress Notes (Signed)
 Office Visit    Patient Name: [name] Date of Encounter: 04/16/2023  Primary Care Provider:  Rilla Baller, MD Primary Cardiologist:  Deatrice Cage, MD  Chief Complaint    89 y.o. female with a history of permanent atrial fibrillation, nonobstructive CAD, hypertension, hyperlipidemia, peripheral arterial disease, moderate mitral regurgitation, and rheumatoid arthritis, who presents for follow-up related to atrial fibrillation and dyspnea.  Past Medical History  Subjective   Past Medical History:  Diagnosis Date   Abdominal aortic atherosclerosis (HCC) 09/2015   by xray   Anxiety    BCC (basal cell carcinoma of skin) 05/2011   on nose   BCC (basal cell carcinoma of skin) 08/09/2020   upper chest, [facility] 12/27/20   Breast lesion    a. diagnosed with complex sclerosing lesion with calcifications of the right breast in 08/2016   CAD (coronary artery disease)    a. nuclear stress test 03/2012: normal; b. 09/2018 MV: EF >65%, no isch/scar; c. 07/2019 Cath: LM nl, LAD 40ost, 67m, LCX nl, RCA 50ost/p (iFR 0.98). EF 55-65% ->Med Rx.   COVID-19 virus infection 10/12/2020   11/2020, 05/2022   DDD (degenerative disc disease), lumbosacral 12/06/1992   Gallstones 09/2015   incidentally by xray   GERD (gastroesophageal reflux disease) 05/1999   Hiatal hernia    History of shingles 2009   ophthalmic, takes acyclovir  daily   Hyperlipidemia 12/2003   Hypertension 1990   Mitral regurgitation    a. echo 03/2012: EF 60-65%, no RWMA, mild to mod AI/MR, mod TR, PASP 37 mmHg; b. 05/2019 Echo: EF 60-65%, mod MR; c. 04/2022 Echo:  EF 50 to 55% with mild LVH, normal RV function, RVSP of 30 mmHg, Mild to moderate MR, moderate TR, and mild AI   Osteoarthritis 08/21/1989   Osteoporosis with fracture 11/1997   with compression fracture T12   PAD (peripheral artery disease) (HCC)    a. 01/2020 PTA of R Peroneal and R PT; b. 09/2021 ABI/TBI: R 1.04/09.2, L 1.36/0.17.   Permanent atrial fibrillation  (HCC) 03/16/2012   a. CHADS2VASc => 5 (HTN, age x 2, vascular disease, sex category)-->Eliquis  5 BID;  b. Recurrent AF in 06/2016;  c. 01/2017 s/p DCCV;  d. 02/2017 recurrent AFib->Amio added->DCCV; e. 03/2017 recurrent AFib, s/p reload of amio and DCCV; f. 12/2020 DCCV-->recurrent Afib, Amio d/c-->rate control.   POSTHERPETIC NEURALGIA 04/20/2007   Qualifier: Diagnosis of  By: Baird FNP, Frieda Kipper    Rheumatoid arthritis (HCC)    RLS (restless legs syndrome)    SCC (squamous cell carcinoma) 05/01/2021   right posterior flank inferior to axilla - well differentiated squamous cell carcinoma with superificial infiltration, base involved. EDC 06/06/2021   SCC (squamous cell carcinoma) 02/05/2023   left upper back, clear 03/18/23   Squamous cell carcinoma in situ 04/17/2022   Right dorsal hand, proximal. SCCis, inverted. Excised 05/08/2022   Squamous cell carcinoma in situ (SCCIS) 02/05/2023   left anterior forearm, EDC 03/18/23   Squamous cell carcinoma of skin 12/19/2021   Proximal right dorsal hand. KA type. [facility] 12/19/2021   Squamous cell carcinoma of skin 12/19/2021   Distal right dorsal hand. KA type. [facility] 12/19/2021   Past Surgical History:  Procedure Laterality Date   ABDOMINAL HYSTERECTOMY  Age 53 - 32   S/P TAH   abdominal ultrasound  04/23/2004   Gallstones   BREAST EXCISIONAL BIOPSY Right 2018   neg/benign complex sclerosing lesion   BREAST LUMPECTOMY WITH RADIOACTIVE SEED LOCALIZATION Right 10/2016  breast lumpectomy with radioactive seed localization and margin assessment for complex sclerosing lesion [place])   BREAST LUMPECTOMY WITH RADIOACTIVE SEED LOCALIZATION Right 11/05/2016   Procedure: RIGHT BREAST LUMPECTOMY WITH RADIOACTIVE SEED LOCALIZATION;  Surgeon: Gail Favorite, MD;  Location: [facility];  Service: General;  Laterality: Right;   CARDIAC CATHETERIZATION     CARDIOVERSION N/A 01/31/2017   Procedure: CARDIOVERSION;  Surgeon: Darron Deatrice LABOR, MD;  Location: [facility];  Service: Cardiovascular;  Laterality: N/A;   CARDIOVERSION N/A 03/03/2017   Procedure: CARDIOVERSION;  Surgeon: Darron Deatrice LABOR, MD;  Location: [facility];  Service: Cardiovascular;  Laterality: N/A;   CARDIOVERSION N/A 04/02/2017   Procedure: CARDIOVERSION;  Surgeon: Darron Deatrice LABOR, MD;  Location: [facility];  Service: Cardiovascular;  Laterality: N/A;   CARDIOVERSION N/A 01/16/2018   Procedure: CARDIOVERSION;  Surgeon: Darron Deatrice LABOR, MD;  Location: [facility];  Service: Cardiovascular;  Laterality: N/A;   CARDIOVERSION N/A 12/25/2020   Procedure: CARDIOVERSION;  Surgeon: Darron Deatrice LABOR, MD;  Location: [facility];  Service: Cardiovascular;  Laterality: N/A;   CATARACT EXTRACTION  05/2010   left eye   CERVICAL DISCECTOMY  1992   Fusion (Dr. Leeann)   CORONARY PRESSURE/FFR STUDY N/A 07/12/2019   Procedure: INTRAVASCULAR PRESSURE WIRE/FFR STUDY;  Surgeon: Darron Deatrice LABOR, MD;  Location: [facility];  Service: Cardiovascular;  Laterality: N/A;   ERCP N/A 10/30/2020   Procedure: ENDOSCOPIC RETROGRADE CHOLANGIOPANCREATOGRAPHY (ERCP);  Surgeon: Charlanne Groom, MD;  Location: [facility] ENDOSCOPY;  Service: Endoscopy;  Laterality: N/A;   ESI Bilateral 01/2016   S1 transforaminal ESI x3   ESOPHAGOGASTRODUODENOSCOPY  01/01/2002   with ulcer, bx. neg; + stricture gastric ulcer with hemorrhage   ESOPHAGOGASTRODUODENOSCOPY  04/23/2004   Esophageal stricture -- dilated   LOWER EXTREMITY ANGIOGRAPHY Left 02/01/2019   LOWER EXTREMITY ANGIOGRAPHY;  Surgeon: Marea Selinda RAMAN, MD   LOWER EXTREMITY ANGIOGRAPHY Right 03/25/2019   Procedure: LOWER EXTREMITY ANGIOGRAPHY;  Surgeon: Marea Selinda RAMAN, MD;  Location: [facility];  Service: Cardiovascular;  Laterality: Right;   LOWER EXTREMITY ANGIOGRAPHY Right 08/02/2019   Procedure: LOWER EXTREMITY ANGIOGRAPHY;  Surgeon: Marea Selinda RAMAN, MD;  Location: [facility];  Service: Cardiovascular;  Laterality: Right;   nuclear stress  test  03/2012   no ischemia   PERIPHERAL VASCULAR BALLOON ANGIOPLASTY Left 01/2019   Percutaneous transluminal angioplasty of left anterior and posterior tibial artery with 2.5 mm diameter by 22 cm length angioplasty balloon ([place]ew)   REMOVAL OF STONES  10/30/2020   Procedure: REMOVAL OF STONES;  Surgeon: Charlanne Groom, MD;  Location: [facility] ENDOSCOPY;  Service: Endoscopy;;   RIGHT/LEFT HEART CATH AND CORONARY ANGIOGRAPHY Bilateral 07/12/2019   Procedure: RIGHT/LEFT HEART CATH AND CORONARY ANGIOGRAPHY;  Surgeon: Darron Deatrice LABOR, MD;  Location: [facility];  Service: Cardiovascular;  Laterality: Bilateral;   SKIN CANCER EXCISION  05/20/2011   BCC from nose   SPHINCTEROTOMY  10/30/2020   Procedure: SPHINCTEROTOMY;  Surgeon: Charlanne Groom, MD;  Location: [facility] END[facility]COPY;  Service: Endoscopy;;   US  ECHOCARDIOGRAPHY  03/2012   in flutter, EF 60%, mild-mod aortic, mitral, tricuspid regurg, mildly dilated LA    Allergies  Allergies  Allergen Reactions   Penicillins Rash and Other (See Comments)    Childhood Allergy Has patient had a PCN reaction causing immediate rash, facial/tongue/throat swelling, SOB or lightheadedness with hypotension: Yes Has patient had a PCN reaction causing severe rash involving mucus membranes or skin necrosis: Unknown Has patient had a PCN  reaction that required hospitalization: No Has patient had a PCN reaction occurring within the last 10 years: No If all of the above answers are NO, then may proceed with Cephalosporin use.       History of Present Illness      89 y.o. y/o female with a history of permanent atrial fibrillation, nonobstructive CAD, hypertension, hyperlipidemia, peripheral arterial disease, moderate mitral regurgitation, and rheumatoid arthritis.  Echocardiogram in February 2021 showed normal LV function with grade 2 diastolic dysfunction, severe pulmonary hypertension, moderate MR, and moderate TR.  Diagnostic catheterization April 2021 showed mild  to moderate nonobstructive CAD including a 50% ostial RCA stenosis, that was not significant by fractional flow reserve.  Right heart catheterization at that time showed mildly elevated filling pressures with mild pulmonary hypertension and low normal cardiac output.  In October 2021, she underwent PTA of the right peroneal and posterior tibial arteries by vascular surgery.  She was hospitalized twice in the summer 2022, first setting of COVID and second in the setting of cholelithiasis, E. coli sepsis, and ascending cholangitis.  In September 2022, she was noted to be in atrial fibrillation and her amiodarone  dose was increased and she subsequently underwent successful cardioversion.  However, at office follow-up, she was noted to be back in atrial fibrillation and decision was made to pursue rate control (amiodarone  discontinued).  Most recent echo January 2024, performed in the setting of dyspnea and fatigue, showed EF of 50 to 55% with mild LVH, normal RV function, RVSP of 30 mmHg.,  Mild to moderate MR, moderate TR, and mild AI.    Mr. Landy was last seen in cardiology clinic in October 2024 at which time she reported 10 pound weight gain which she attributed to being less active.  She was noted to have crackles on exam with mild ankle edema.  Lasix  was increased from 20 mg, 3 times a week, to 20 mg daily.  Renal function and electrolytes were stable on lab work on October 23.  Since her last visit, she has done well.  She has had no recurrence of lower extremity swelling.  She is chronic, stable dyspnea on exertion which has been present for some time.  Activity is very limited in the setting of unsteady gait.  She walks some around her home (independent living facility) but does not routinely exercise.  She sits and naps in her recliner throughout the day.  She denies chest pain, palpitations, PND, orthopnea, dizziness, syncope, edema, or early satiety. Objective  Home Medications    Current  Outpatient Medications  Medication Sig Dispense Refill   acetaminophen  (TYLENOL ) 500 MG tablet Take 500 mg by mouth every 6 (six) hours as needed for mild pain.     apixaban  (ELIQUIS ) 5 MG TABS tablet Take 1 tablet (5 mg total) by mouth 2 (two) times daily. 180 tablet 1   atorvastatin  (LIPITOR) 20 MG tablet Take 1 tablet (20 mg total) by mouth daily. 90 tablet 4   cyanocobalamin  (VITAMIN B12) 1000 MCG tablet Take 1 tablet (1,000 mcg total) by mouth daily.     Docusate Calcium  (STOOL SOFTENER PO) Take 1 capsule by mouth daily.     DULoxetine  (CYMBALTA ) 30 MG capsule Take 1 capsule (30 mg total) by mouth daily. For mood and neuropathy 30 capsule 12   furosemide  (LASIX ) 20 MG tablet Take 1 tablet (20 mg total) by mouth daily. 30 tablet 11   gabapentin  (NEURONTIN ) 300 MG capsule Take 1 capsule (300 mg total) by mouth  2 (two) times daily. 180 capsule 3   Iron , Ferrous Sulfate , 325 (65 Fe) MG TABS Take 325 mg by mouth 2 (two) times a week.     lisinopril  (ZESTRIL ) 10 MG tablet Take 1 tablet (10 mg total) by mouth daily. 90 tablet 4   Melatonin 10 MG TABS Take 1 tablet by mouth at bedtime.     memantine  (NAMENDA ) 5 MG tablet Take 1 tablet (5 mg total) by mouth 2 (two) times daily. 60 tablet 12   metoprolol  succinate (TOPROL -XL) 100 MG 24 hr tablet Take 1 tablet (100 mg total) by mouth in the morning and at bedtime. 180 tablet 0   pantoprazole  (PROTONIX ) 40 MG tablet Take 1 tablet (40 mg total) by mouth daily. 90 tablet 4   polyethylene glycol (MIRALAX  / GLYCOLAX ) 17 g packet Take 17 g by mouth daily as needed for mild constipation. 14 each 0   Potassium 99 MG TABS Take 1 tablet (99 mg total) by mouth daily.     prednisoLONE  acetate (PRED FORTE ) 1 % ophthalmic suspension Place 1 drop into the left eye daily.      rOPINIRole  (REQUIP ) 2 MG tablet Take 1 tablet (2 mg total) by mouth in the morning and at bedtime. 60 tablet 6   traMADol  (ULTRAM ) 50 MG tablet Take 1-2 tablets (50-100 mg total) by mouth at  bedtime as needed. 30 tablet 0   Vitamin D , Ergocalciferol , (DRISDOL ) 1.25 MG (50000 UNIT) CAPS capsule TAKE 1 CAPSULE BY MOUTH ONCE WEEKLY (EVERY 7 DAYS) 12 capsule 0   Current Facility-Administered Medications  Medication Dose Route Frequency Provider Last Rate Last Admin   denosumab  (PROLIA ) injection 60 mg  60 mg Subcutaneous Once Rilla Baller, MD         Physical Exam    VS:  BP 132/76   Pulse 64   Ht 5' 3 (1.6 m)   Wt 147 lb (66.7 kg)   SpO2 95%   BMI 26.04 kg/m  , BMI Body mass index is 26.04 kg/m.       GEN: Well nourished, well developed, in no acute distress. HEENT: normal. Neck: Supple, no JVD, carotid bruits, or masses. Cardiac: Irregularly, irregular, no murmurs, rubs, or gallops. No clubbing, cyanosis, edema.  Radials 2+/PT 2+ and equal bilaterally.  Respiratory:  Respirations regular and unlabored, clear to auscultation bilaterally. GI: Soft, nontender, nondistended, BS + x 4. MS: no deformity or atrophy. Skin: warm and dry, no rash. Neuro:  Strength and sensation are intact. Psych: Normal affect.  Accessory Clinical Findings    ECG personally reviewed by me today - EKG Interpretation Date/Time:  Wednesday April 16 2023 15:04:20 EST Ventricular Rate:  64 PR Interval:    QRS Duration:  82 QT Interval:  428 QTC Calculation: 441 R Axis:   9  Text Interpretation: Atrial fibrillation Cannot rule out Inferior infarct , age undetermined Confirmed by Vivienne Bruckner [phone]) on 04/16/2023 3:09:03 PM   - no acute changes.  Lab Results  Component Value Date   WBC 6.2 01/29/2023   HGB 14.0 01/29/2023   HCT 43.3 01/29/2023   MCV 94.0 01/29/2023   PLT 253.0 01/29/2023   Lab Results  Component Value Date   CREATININE 1.08 01/29/2023   BUN 16 01/29/2023   NA 144 01/29/2023   K 4.2 01/29/2023   CL 103 01/29/2023   CO2 33 (H) 01/29/2023   Lab Results  Component Value Date   ALT 12 01/29/2023   AST 15 01/29/2023  ALKPHOS 60 01/29/2023   BILITOT  0.5 01/29/2023   Lab Results  Component Value Date   CHOL 140 01/29/2023   HDL 48.50 01/29/2023   LDLCALC 53 01/29/2023   LDLDIRECT 173.1 02/19/2010   TRIG 192.0 (H) 01/29/2023   CHOLHDL 3 01/29/2023    Lab Results  Component Value Date   HGBA1C 6.5 (H) 06/04/2022   Lab Results  Component Value Date   TSH 2.271 02/15/2022       Assessment & Plan    1.  Permanent atrial fibrillation: Rate controlled on beta-blocker therapy.  She is anticoagulated with Eliquis  with normal H&H and renal function in October.  2.  Chronic HFpEF: Chronic, stable dyspnea on exertion with stable heart rate and blood pressure.  She is euvolemic on examination and has been taking Lasix  20 mg daily without incident since the fall.  3.  Moderate mitral regurgitation: Ongoing moderate MR based on echo in January 2023.  No significant murmur on examination.  4.  Primary hypertension: Blood pressure was initially elevated but on repeat she was 132/76.  Her daughter indicates that she typically runs in the 1 teens at home.  She remains on beta-blocker, ACE inhibitor, and diuretic therapy.  5.  Hyperlipidemia: LDL of 53 in October 2024 with normal LFTs at that time.  Continue statin therapy.  6.  Nonobstructive CAD: Catheterization 2021 with moderate, nonobstructive CAD.  She is chronic, stable dyspnea on exertion without chest pain.  Normal LV function by echo in January 2024.  She remains on beta-blocker and statin therapy.  No aspirin  in the setting of chronic Eliquis .  7.  Peripheral arterial disease: Denies claudication.  Remains on statin and Eliquis  in place of aspirin .  Followed by vascular surgery.  8.  Disposition: Follow-up in 6 months or sooner if necessary.  Lonni Meager, NP 04/16/2023, 4:57 PM

## 2023-04-16 NOTE — Patient Instructions (Signed)
 Medication Instructions:  No changes *If you need a refill on your cardiac medications before your next appointment, please call your pharmacy*   Lab Work: None ordered If you have labs (blood work) drawn today and your tests are completely normal, you will receive your results only by: MyChart Message (if you have MyChart) OR A paper copy in the mail If you have any lab test that is abnormal or we need to change your treatment, we will call you to review the results.   Testing/Procedures: None ordered   Follow-Up: At [facility], you and your health needs are our priority.  As part of our continuing mission to provide you with exceptional heart care, we have created designated Provider Care Teams.  These Care Teams include your primary Cardiologist (physician) and Advanced Practice Providers (APPs -  Physician Assistants and Nurse Practitioners) who all work together to provide you with the care you need, when you need it.  We recommend signing up for the patient portal called "MyChart".  Sign up information is provided on this After Visit Summary.  MyChart is used to connect with patients for Virtual Visits (Telemedicine).  Patients are able to view lab/test results, encounter notes, upcoming appointments, etc.  Non-urgent messages can be sent to your provider as well.   To learn more about what you can do with MyChart, go to [email or web address].    Your next appointment:   6 month(s)  Provider:   Dr. Kirke Corin

## 2023-04-24 ENCOUNTER — Other Ambulatory Visit: Payer: Self-pay

## 2023-05-08 ENCOUNTER — Other Ambulatory Visit: Payer: Self-pay | Admitting: Family Medicine

## 2023-05-09 ENCOUNTER — Telehealth: Payer: Self-pay

## 2023-05-09 ENCOUNTER — Other Ambulatory Visit (HOSPITAL_COMMUNITY): Payer: Self-pay

## 2023-05-09 NOTE — Telephone Encounter (Signed)
 Prolia VOB initiated via [email or web address]  Next Prolia inj DUE: NOW

## 2023-05-12 NOTE — Telephone Encounter (Signed)
 [name]

## 2023-05-15 ENCOUNTER — Other Ambulatory Visit (HOSPITAL_COMMUNITY): Payer: Self-pay

## 2023-05-15 NOTE — Telephone Encounter (Signed)
 Pt ready for scheduling for PROLIA  on or after : 05/15/23  Out-of-pocket cost due at time of visit: $362  Number of injection/visits approved: 2  Primary: [employer] MEDICARE Prolia  co-insurance: 20% Admin fee co-insurance: $30  Secondary: --- Prolia  co-insurance:  Admin fee co-insurance:   Medical Benefit Details: Date Benefits were checked: 05/09/23 Deductible: NO/ Coinsurance: 20%/ Admin Fee: $30  Prior Auth: APPROVED PA# [id number], [id number] Expiration Date: 05/17/22-05/18/23, 05/19/23-05/18/24  # of doses approved: 2  Pharmacy benefit: Copay $300 If patient wants fill through the pharmacy benefit please send prescription to: [facility], and include estimated need by date in rx notes. Pharmacy will ship medication directly to the office.  Patient NOT eligible for Prolia  Copay Card. Copay Card can make patient's cost as little as $25. Link to apply: [email or web address]://[email or web address].[email or web address]  ** This summary of benefits is an estimation of the patient's out-of-pocket cost. Exact cost may very based on individual plan coverage.

## 2023-05-20 ENCOUNTER — Other Ambulatory Visit: Payer: Self-pay | Admitting: Family Medicine

## 2023-05-20 DIAGNOSIS — E559 Vitamin D deficiency, unspecified: Secondary | ICD-10-CM

## 2023-05-20 NOTE — Telephone Encounter (Signed)
Vit D Last filled:  02/28/23, #12 Last OV:  01/29/23, AWV Next OV:  07/30/23, 6 mo f/u

## 2023-05-22 NOTE — Telephone Encounter (Signed)
Called daughter on dpr per her request I am sending I am sending all the information on cost to her. She would like to review everything and think about options. She will reach out once she had made a decision.

## 2023-05-27 ENCOUNTER — Encounter: Payer: Self-pay | Admitting: Dermatology

## 2023-05-27 ENCOUNTER — Ambulatory Visit: Payer: Medicare Other | Admitting: Dermatology

## 2023-05-27 DIAGNOSIS — L578 Other skin changes due to chronic exposure to nonionizing radiation: Secondary | ICD-10-CM | POA: Diagnosis not present

## 2023-05-27 DIAGNOSIS — Z85828 Personal history of other malignant neoplasm of skin: Secondary | ICD-10-CM | POA: Diagnosis not present

## 2023-05-27 DIAGNOSIS — W908XXA Exposure to other nonionizing radiation, initial encounter: Secondary | ICD-10-CM | POA: Diagnosis not present

## 2023-05-27 DIAGNOSIS — L821 Other seborrheic keratosis: Secondary | ICD-10-CM | POA: Diagnosis not present

## 2023-05-27 DIAGNOSIS — Z86007 Personal history of in-situ neoplasm of skin: Secondary | ICD-10-CM | POA: Diagnosis not present

## 2023-05-27 DIAGNOSIS — Z8589 Personal history of malignant neoplasm of other organs and systems: Secondary | ICD-10-CM

## 2023-05-27 DIAGNOSIS — L820 Inflamed seborrheic keratosis: Secondary | ICD-10-CM

## 2023-05-27 NOTE — Progress Notes (Signed)
   Follow-Up Visit   Subjective  [name] is a 89 y.o. female who presents for the following: Recheck SCCIS site at the L forearm, and SCC of the L upper back, patient here today for recheck. Care giver has noticed an irregular skin lesion under her R eye that she would liked checked today.  The patient has spots, moles and lesions to be evaluated, some may be new or changing and the patient may have concern these could be cancer.   The following portions of the chart were reviewed this encounter and updated as appropriate: medications, allergies, medical history  Review of Systems:  No other skin or systemic complaints except as noted in HPI or Assessment and Plan.  Objective  Well appearing patient in no apparent distress; mood and affect are within normal limits.   A focused examination was performed of the following areas: The face, arms, and back   Relevant exam findings are noted in the Assessment and Plan.  Right Upper Back, R infraocular (2) Erythematous stuck-on, waxy papule or plaque  Assessment & Plan   ACTINIC DAMAGE - chronic, secondary to cumulative UV radiation exposure/sun exposure over time - diffuse scaly erythematous macules with underlying dyspigmentation - Recommend daily broad spectrum sunscreen SPF 30+ to sun-exposed areas, reapply every 2 hours as needed.  - Recommend staying in the shade or wearing long sleeves, sun glasses (UVA+UVB protection) and wide brim hats (4-inch brim around the entire circumference of the hat). - Call for new or changing lesions.  SEBORRHEIC KERATOSIS - Stuck-on, waxy, tan-brown papules and/or plaques  - Benign-appearing - Discussed benign etiology and prognosis. - Observe - Call for any changes  HISTORY OF SQUAMOUS CELL CARCINOMA IN SITU OF THE SKIN - L forearm  - No evidence of recurrence today. Crusted scaly papule secondary to trauma per family - Recommend regular full body skin exams - Recommend daily broad  spectrum sunscreen SPF 30+ to sun-exposed areas, reapply every 2 hours as needed.  - Call if any new or changing lesions are noted between office visits   HISTORY OF SQUAMOUS CELL CARCINOMA OF THE SKIN - L upper back - No evidence of recurrence today - Recommend regular full body skin exams - Recommend daily broad spectrum sunscreen SPF 30+ to sun-exposed areas, reapply every 2 hours as needed.  - Call if any new or changing lesions are noted between office visits INFLAMED SEBORRHEIC KERATOSIS (2) Right Upper Back, R infraocular (2) Benign, no tx needed.  HISTORY OF SQUAMOUS CELL CARCINOMA    Return in about 6 months (around 11/24/2023) for TBSE hx SCC, SCCIS.  Maylene Roes, CMA, am acting as scribe for Elie Goody, MD .   Documentation: I have reviewed the above documentation for accuracy and completeness, and I agree with the above.  Elie Goody, MD

## 2023-05-27 NOTE — Patient Instructions (Signed)

## 2023-06-13 ENCOUNTER — Other Ambulatory Visit: Payer: Self-pay | Admitting: Family Medicine

## 2023-06-13 DIAGNOSIS — M51369 Other intervertebral disc degeneration, lumbar region without mention of lumbar back pain or lower extremity pain: Secondary | ICD-10-CM

## 2023-06-13 NOTE — Telephone Encounter (Signed)
 Name of Medication: Tramadol Name of Pharmacy:  [facility] Last Fill or Written Date and Quantity:  09/11/22, #30 Last Office Visit and Type:  01/29/23, AWV Next Office Visit and Type:  07/30/23, 6 mo f/u Last Controlled Substance Agreement Date:  none Last UDS:  none

## 2023-06-16 ENCOUNTER — Encounter: Payer: Self-pay | Admitting: Family Medicine

## 2023-06-16 ENCOUNTER — Ambulatory Visit: Payer: Self-pay | Admitting: Family Medicine

## 2023-06-16 NOTE — Telephone Encounter (Signed)
 See note triage encounter

## 2023-06-16 NOTE — Telephone Encounter (Signed)
 Chief Complaint: pain  Symptoms: right sided pain Frequency: since last Friday Pertinent Negatives: Patient denies fever, sob, falls, n/v Disposition: [] ED /[] Urgent Care (no appt availability in office) / [] Appointment(In office/virtual)/ []  [facility]/ [x] Home Care/ [] Refused Recommended Disposition /[] [facility]/ []  Follow-up with PCP Additional Notes: Patients Daughter called stating patient has been having pain in right side just above her waist for bout 10-11 days. No other symptoms and only painful when she is moving around.  Patients daughter thinks her mother may have over stretched  "showing a friend she was still able to over at her age".  Daughter has been giving her Tramadol and Zanaflex  and Tylenol for the pain but she is out of tramadol and Zanaflex and it was also expired. She was offered a video visit but would just like a refill sent in for the medication if possible.  Daughter states pain is 8/10 when her mother is up moving around and that she will now only get up when need to use the restroom.   Reason for Disposition  Back pain present > 2 weeks  Answer Assessment - Initial Assessment Questions 1. ONSET: "When did the pain begin?"      About last Friday 2. LOCATION: "Where does it hurt?" (upper, mid or lower back)     Right side, at waist level 3. SEVERITY: "How bad is the pain?"  (e.g., Scale 1-10; mild, moderate, or severe)   - MILD (1-3): Doesn't interfere with normal activities.    - MODERATE (4-7): Interferes with normal activities or awakens from sleep.    - SEVERE (8-10): Excruciating pain, unable to do any normal activities.      8 per daughter winces in face when moving 4. PATTERN: "Is the pain constant?" (e.g., yes, no; constant, intermittent)      intermittent 5. RADIATION: "Does the pain shoot into your legs or somewhere else?"     no 6. CAUSE:  "What do you think is causing the back pain?"      unknown 7. BACK OVERUSE:  "Any  recent lifting of heavy objects, strenuous work or exercise?"     no 8. MEDICINES: "What have you taken so far for the pain?" (e.g., nothing, acetaminophen, NSAIDS)     Tramadol and tizanidine 9. NEUROLOGIC SYMPTOMS: "Do you have any weakness, numbness, or problems with bowel/bladder control?"     no 10. OTHER SYMPTOMS: "Do you have any other symptoms?" (e.g., fever, abdomen pain, burning with urination, blood in urine) no  Protocols used: Back Pain-A-AH

## 2023-06-16 NOTE — Telephone Encounter (Signed)
 ERx

## 2023-06-16 NOTE — Telephone Encounter (Signed)
 Please triage pt

## 2023-06-17 MED ORDER — TIZANIDINE HCL 2 MG PO TABS: 2.0000 mg | ORAL_TABLET | Freq: Two times a day (BID) | ORAL | 0 refills | Status: DC | PRN

## 2023-06-17 NOTE — Telephone Encounter (Signed)
 Tramadol filled yesterday. Zanaflex refilled today.  Rec heating pad covered in a towel to area.  Rec OV if not improving with home treatment.

## 2023-06-24 ENCOUNTER — Other Ambulatory Visit: Payer: Self-pay | Admitting: Cardiovascular Disease

## 2023-06-30 DIAGNOSIS — M7542 Impingement syndrome of left shoulder: Secondary | ICD-10-CM | POA: Diagnosis not present

## 2023-06-30 DIAGNOSIS — M5416 Radiculopathy, lumbar region: Secondary | ICD-10-CM | POA: Diagnosis not present

## 2023-06-30 DIAGNOSIS — M48062 Spinal stenosis, lumbar region with neurogenic claudication: Secondary | ICD-10-CM | POA: Diagnosis not present

## 2023-06-30 DIAGNOSIS — M7918 Myalgia, other site: Secondary | ICD-10-CM | POA: Diagnosis not present

## 2023-07-17 DIAGNOSIS — M5380 Other specified dorsopathies, site unspecified: Secondary | ICD-10-CM | POA: Diagnosis not present

## 2023-07-17 DIAGNOSIS — M7542 Impingement syndrome of left shoulder: Secondary | ICD-10-CM | POA: Diagnosis not present

## 2023-07-17 DIAGNOSIS — M48062 Spinal stenosis, lumbar region with neurogenic claudication: Secondary | ICD-10-CM | POA: Diagnosis not present

## 2023-07-17 DIAGNOSIS — M5416 Radiculopathy, lumbar region: Secondary | ICD-10-CM | POA: Diagnosis not present

## 2023-07-17 DIAGNOSIS — M62830 Muscle spasm of back: Secondary | ICD-10-CM | POA: Diagnosis not present

## 2023-07-30 ENCOUNTER — Ambulatory Visit (INDEPENDENT_AMBULATORY_CARE_PROVIDER_SITE_OTHER): Payer: Medicare Other | Admitting: Family Medicine

## 2023-07-30 ENCOUNTER — Encounter: Payer: Self-pay | Admitting: Family Medicine

## 2023-07-30 VITALS — BP 118/78 | HR 64 | Temp 98.0°F | Ht 63.0 in | Wt 142.5 lb

## 2023-07-30 DIAGNOSIS — I739 Peripheral vascular disease, unspecified: Secondary | ICD-10-CM | POA: Diagnosis not present

## 2023-07-30 DIAGNOSIS — Z515 Encounter for palliative care: Secondary | ICD-10-CM

## 2023-07-30 DIAGNOSIS — G8929 Other chronic pain: Secondary | ICD-10-CM | POA: Diagnosis not present

## 2023-07-30 DIAGNOSIS — I10 Essential (primary) hypertension: Secondary | ICD-10-CM

## 2023-07-30 DIAGNOSIS — M5416 Radiculopathy, lumbar region: Secondary | ICD-10-CM | POA: Diagnosis not present

## 2023-07-30 DIAGNOSIS — G3184 Mild cognitive impairment, so stated: Secondary | ICD-10-CM

## 2023-07-30 DIAGNOSIS — E538 Deficiency of other specified B group vitamins: Secondary | ICD-10-CM

## 2023-07-30 DIAGNOSIS — Z66 Do not resuscitate: Secondary | ICD-10-CM | POA: Diagnosis not present

## 2023-07-30 DIAGNOSIS — G2581 Restless legs syndrome: Secondary | ICD-10-CM | POA: Diagnosis not present

## 2023-07-30 MED ORDER — MEMANTINE HCL 5 MG PO TABS: 10.0000 mg | ORAL_TABLET | Freq: Two times a day (BID) | ORAL | 6 refills | Status: DC

## 2023-07-30 NOTE — Progress Notes (Signed)
 Ph: [phone] Fax: [phone]   Patient ID: [name], female    DOB: July 30, 1929, 89 y.o.   MRN: [id number]  This visit was conducted in person.  BP 118/78   Pulse 64   Temp 98 F (36.7 C) (Oral)   Ht 5\' 3"  (1.6 m)   Wt 142 lb 8 oz (64.6 kg)   SpO2 94%   BMI 25.24 kg/m   BP Readings from Last 3 Encounters:  07/30/23 118/78  04/16/23 132/76  01/29/23 124/66  BP 120/80 on recheck  CC: 6 mo f/u visit  Subjective:   HPI: [name] is a 89 y.o. female presenting on 07/30/2023 for Medical Management of Chronic Issues (Here for 6 mo f/u. Pt accompanied by daughter, Devra Fontana. Pt brought in home BP monitors to compare. Reading in office today- wrist:  97/57. Arm cuff read error message. )   HTN - Compliant with current antihypertensive regimen of lasix  20mg  daily, lisinopril  10mg  daily, toprol  XL 100mg  BID. Does check blood pressures at home: see above - recent low blood pressure readings noted at home. No dizziness/syncope. Denies HA, vision changes, CP/tightness, SOB, leg swelling.    Saw PM&R For R side/hip pain x2+ months - s/p R hip and back injections without full benefit.  Hospital bed prescribed.  Requiring full assistance to transfers.  Ongoing fatigue and leg weakness.  Daughter notes worsening difficulty with cognition.  Ongoing pain.  Current regimen:  1. Continue Tylenol  1000 mg twice daily as needed. 2. Continue gabapentin  300 mg BID. 3. Continues tramadol  50 mg 1/2 tab BID with extra whole pill PRN She also continues requip  2mg  BID - RLS symptoms are better with this  Cutting down on tizanidine  use  Increased sleep noted - both day and night. Worsening memory noted by daughter.  Decreased fluid intake, notes normal urination.   Palliative care remains involved - coming out every 1-2 months.      Relevant past medical, surgical, family and social history reviewed and updated as indicated. Interim medical history since our last visit  reviewed. Allergies and medications reviewed and updated. Outpatient Medications Prior to Visit  Medication Sig Dispense Refill   acetaminophen  (TYLENOL ) 500 MG tablet Take 500 mg by mouth every 6 (six) hours as needed for mild pain.     apixaban  (ELIQUIS ) 5 MG TABS tablet Take 1 tablet (5 mg total) by mouth 2 (two) times daily. 180 tablet 1   atorvastatin  (LIPITOR) 20 MG tablet Take 1 tablet (20 mg total) by mouth daily. 90 tablet 4   Docusate Calcium  (STOOL SOFTENER PO) Take 1 capsule by mouth daily.     DULoxetine  (CYMBALTA ) 30 MG capsule Take 1 capsule (30 mg total) by mouth daily. For mood and neuropathy 30 capsule 12   furosemide  (LASIX ) 20 MG tablet Take 1 tablet (20 mg total) by mouth daily. 30 tablet 11   gabapentin  (NEURONTIN ) 300 MG capsule Take 1 capsule (300 mg total) by mouth 2 (two) times daily. 180 capsule 3   Iron , Ferrous Sulfate , 325 (65 Fe) MG TABS Take 325 mg by mouth 2 (two) times a week.     Melatonin 10 MG TABS Take 1 tablet by mouth at bedtime.     metoprolol  succinate (TOPROL -XL) 100 MG 24 hr tablet take 1 tablet by mouth every morning AND TAKE ONE TABLET at bedtime 200 tablet 2   pantoprazole  (PROTONIX ) 40 MG tablet Take 1 tablet (40 mg total) by mouth daily. 90 tablet 4  polyethylene glycol (MIRALAX  / GLYCOLAX ) 17 g packet Take 17 g by mouth daily as needed for mild constipation. 14 each 0   Potassium 99 MG TABS Take 1 tablet (99 mg total) by mouth daily.     prednisoLONE  acetate (PRED FORTE ) 1 % ophthalmic suspension Place 1 drop into the left eye daily.      rOPINIRole  (REQUIP ) 2 MG tablet Take 1 tablet by mouth twice daily IN THE MORNING AND at bedtime 60 tablet 6   tiZANidine  (ZANAFLEX ) 2 MG tablet Take 1 tablet (2 mg total) by mouth 2 (two) times daily as needed for muscle spasms (sedation precautions). 20 tablet 0   traMADol  (ULTRAM ) 50 MG tablet Take 1 tablet (50 mg total) by mouth 2 (two) times daily as needed for moderate pain (pain score 4-6). 30 tablet 0    Vitamin D , Ergocalciferol , (DRISDOL ) 1.25 MG (50000 UNIT) CAPS capsule TAKE 1 CAPSULE BY MOUTH ONCE WEEKLY (EVERY 7 DAYS) 12 capsule 0   lisinopril  (ZESTRIL ) 10 MG tablet Take 1 tablet (10 mg total) by mouth daily. 90 tablet 4   memantine  (NAMENDA ) 5 MG tablet Take 1 tablet (5 mg total) by mouth 2 (two) times daily. 60 tablet 12   cyanocobalamin  (VITAMIN B12) 1000 MCG tablet Take 1 tablet (1,000 mcg total) by mouth daily. (Patient not taking: Reported on 07/30/2023)     Facility-Administered Medications Prior to Visit  Medication Dose Route Frequency Provider Last Rate Last Admin   denosumab  (PROLIA ) injection 60 mg  60 mg Subcutaneous Once Claire Crick, MD         Per HPI unless specifically indicated in ROS section below Review of Systems  Objective:  BP 118/78   Pulse 64   Temp 98 F (36.7 C) (Oral)   Ht 5\' 3"  (1.6 m)   Wt 142 lb 8 oz (64.6 kg)   SpO2 94%   BMI 25.24 kg/m   Wt Readings from Last 3 Encounters:  07/30/23 142 lb 8 oz (64.6 kg)  04/16/23 147 lb (66.7 kg)  01/29/23 147 lb (66.7 kg)      Physical Exam Vitals and nursing note reviewed.  Constitutional:      Appearance: Normal appearance. She is not ill-appearing.     Comments: Sitting in wheelchair  HENT:     Mouth/Throat:     Mouth: Mucous membranes are moist.     Pharynx: Oropharynx is clear. No oropharyngeal exudate or posterior oropharyngeal erythema.  Eyes:     Extraocular Movements: Extraocular movements intact.     Pupils: Pupils are equal, round, and reactive to light.  Cardiovascular:     Rate and Rhythm: Normal rate and regular rhythm.     Pulses: Normal pulses.     Heart sounds: Normal heart sounds. No murmur heard. Pulmonary:     Effort: Pulmonary effort is normal. No respiratory distress.     Breath sounds: Normal breath sounds. No wheezing, rhonchi or rales.  Musculoskeletal:     Right lower leg: No edema.     Left lower leg: No edema.  Skin:    General: Skin is warm and dry.      Findings: No rash.     Comments: Purple hue to BLE shin down, chronic  Neurological:     Mental Status: She is alert.  Psychiatric:        Mood and Affect: Mood normal.        Lab Results  Component Value Date   VITAMINB12 194 (L) 01/29/2023  Lab Results  Component Value Date   NA 144 01/29/2023   CL 103 01/29/2023   K 4.2 01/29/2023   CO2 33 (H) 01/29/2023   BUN 16 01/29/2023   CREATININE 1.08 01/29/2023   GFR 44.86 (L) 01/29/2023   CALCIUM  9.1 01/29/2023   PHOS 4.5 01/29/2023   ALBUMIN 4.1 01/29/2023   GLUCOSE 120 (H) 01/29/2023    Assessment & Plan:   Problem List Items Addressed This Visit     Palliative care by specialist (Chronic)   Outpatient palliative care team remains involved.       DNR (do not resuscitate) (Chronic)   Essential hypertension   Chronic, overall stable readings on lisinopril  10mg  daily and toprol  XL 100mg  BID however more recently BP is trending down. She also regularly takes lasix  20mg .  Concern for hypoperfusion /hypotension contributing to increased somnolence - will slowly taper lasix  dose as per instructions (in setting of poor oral fluid intake), and will also stop lisinopril  for now. Discussed may restart at lower 5mg  dose if BP starts trending up. Discussed monitoring for worsening leg swelling as lasix  is tapered      RLS (restless legs syndrome)   Requip  has helped.       Chronic radicular pain of lower back   Ongoing difficulty, followed by PM&R Dr Erman Hayward s/p recent steroid injections to R hip and back She also continues regular tylenol , gabapentin , tramadol  use. She has been cutting down on tizanidine  use.      Relevant Medications   memantine  (NAMENDA ) 5 MG tablet   Vitamin B12 deficiency   B12 levels were low - they have not been repleting - discussed restarting b12 1000mcg daily in effort to help memory.       PAD (peripheral artery disease) (HCC)   Latest ABIs stable - 1.1 bilaterally (07/2022). She continues  atorvastatin  and eliquis .  Discussed reasonable to postpone VVS eval at this time.       MCI (mild cognitive impairment) with memory loss - Primary   Ongoing difficulty noted by daughter Devra Fontana - with slowed cognition and increased somnolence. Continues namenda  5mg  bid - will try taper over next few weeks to 10mg  BID and if no benefit noted, low threshold to fully taper off namenda .       Relevant Medications   memantine  (NAMENDA ) 5 MG tablet     Meds ordered this encounter  Medications   memantine  (NAMENDA ) 5 MG tablet    Sig: Take 2 tablets (10 mg total) by mouth 2 (two) times daily.    Dispense:  120 tablet    Refill:  6    No orders of the defined types were placed in this encounter.   Patient Instructions  Stop lisinopril  for now, monitor blood pressures off this medicine. If bp rises off this, may restart 1/2 tablet daily.  Drop lasix  to every other day and if doing well after 1-2 weeks without leg swelling, shortness of breath, may trial off lasix .  Increase namenda  to 5mg  in am and 10mg  in pm for 1 week then increase to 10mg  twice daily. Update me with effect in 1-2 months.  Ok to postpone vascular appointment for the time being.  Restart b12 vitamin  Follow up plan: Return in about 3 months (around 10/29/2023), or if symptoms worsen or fail to improve, for follow up visit.  Claire Crick, MD

## 2023-07-30 NOTE — Patient Instructions (Addendum)
 Stop lisinopril  for now, monitor blood pressures off this medicine. If bp rises off this, may restart 1/2 tablet daily.  Drop lasix  to every other day and if doing well after 1-2 weeks without leg swelling, shortness of breath, may trial off lasix .  Increase namenda  to 5mg  in am and 10mg  in pm for 1 week then increase to 10mg  twice daily. Update me with effect in 1-2 months.  Ok to postpone vascular appointment for the time being.  Restart b12 vitamin

## 2023-08-02 ENCOUNTER — Encounter: Payer: Self-pay | Admitting: Family Medicine

## 2023-08-02 NOTE — Assessment & Plan Note (Signed)
 Requip  has helped.

## 2023-08-02 NOTE — Assessment & Plan Note (Addendum)
 Chronic, overall stable readings on lisinopril  10mg  daily and toprol  XL 100mg  BID however more recently BP is trending down. She also regularly takes lasix  20mg .  Concern for hypoperfusion /hypotension contributing to increased somnolence - will slowly taper lasix  dose as per instructions (in setting of poor oral fluid intake), and will also stop lisinopril  for now. Discussed may restart at lower 5mg  dose if BP starts trending up. Discussed monitoring for worsening leg swelling as lasix  is tapered

## 2023-08-02 NOTE — Assessment & Plan Note (Addendum)
 Ongoing difficulty noted by daughter [name] - with slowed cognition and increased somnolence. Continues namenda  5mg  bid - will try taper over next few weeks to 10mg  BID and if no benefit noted, low threshold to fully taper off namenda .

## 2023-08-02 NOTE — Assessment & Plan Note (Signed)
 Outpatient palliative care team remains involved.

## 2023-08-02 NOTE — Assessment & Plan Note (Signed)
 B12 levels were low - they have not been repleting - discussed restarting b12 1000mcg daily in effort to help memory.

## 2023-08-02 NOTE — Assessment & Plan Note (Signed)
 Latest ABIs stable - 1.1 bilaterally (07/2022). She continues atorvastatin  and eliquis .  Discussed reasonable to postpone VVS eval at this time.

## 2023-08-02 NOTE — Assessment & Plan Note (Addendum)
 Ongoing difficulty, followed by PM&R Dr Erman Hayward s/p recent steroid injections to R hip and back She also continues regular tylenol , gabapentin , tramadol  use. She has been cutting down on tizanidine  use.

## 2023-08-05 ENCOUNTER — Ambulatory Visit (INDEPENDENT_AMBULATORY_CARE_PROVIDER_SITE_OTHER): Payer: Medicare Other | Admitting: Vascular Surgery

## 2023-08-05 ENCOUNTER — Encounter (INDEPENDENT_AMBULATORY_CARE_PROVIDER_SITE_OTHER): Payer: Medicare Other

## 2023-08-08 DIAGNOSIS — M1611 Unilateral primary osteoarthritis, right hip: Secondary | ICD-10-CM | POA: Diagnosis not present

## 2023-08-20 ENCOUNTER — Telehealth: Payer: Self-pay

## 2023-08-20 NOTE — Telephone Encounter (Signed)
 Copied from CRM [id number]. Topic: General - Other >> Aug 19, 2023  3:40 PM Rosamond Comes wrote: Reason for CRM: [facility] [phone] calling to inform Dr Mariam Shingles that patient is now bed bound family would like to be evaluated for hospice care, and if Dr Mariam Shingles would continue being her provider, and does Dr Mariam Shingles want the hospice providers symptom management piece  [facility] [phone]

## 2023-08-20 NOTE — Telephone Encounter (Addendum)
 Yes agree with hospice evaluation I can continue being attending, would like to have hospice provider's assistance with symptom management.

## 2023-08-20 NOTE — Telephone Encounter (Signed)
 Returned [name]'s call notifying her Dr Crissie Dome agrees to hospice eval, will continue as attending and would like hospice provider's assistance with sxs mgmt. Concha Deed verbalizes understanding and will document info.   Concha Deed wanted to make Dr Crissie Dome aware pt was evaluated and treated for hip pain with x-rays and injections. Injections were helpful but then pt c/o rib pain. At that point it was decided tx to keep pt comfortable where she is at this point. Fyi to Dr Crissie Dome.

## 2023-08-26 ENCOUNTER — Encounter (INDEPENDENT_AMBULATORY_CARE_PROVIDER_SITE_OTHER): Payer: Self-pay

## 2023-09-26 ENCOUNTER — Telehealth: Payer: Self-pay | Admitting: Family Medicine

## 2023-09-26 NOTE — Telephone Encounter (Signed)
 Spoke with daughter Blanchard Bunk to express my condolences.  Daughter Devra Fontana wanted to say thank you to Edwina Gram for being so caring to her mother.

## 2023-09-29 NOTE — Telephone Encounter (Signed)
 Thank you for letting me know. I'm so sorry to hear this.[email or web address]

## 2023-10-07 DEATH — deceased

## 2023-11-27 ENCOUNTER — Ambulatory Visit: Payer: Medicare Other | Admitting: Dermatology

## 2024-01-20 ENCOUNTER — Encounter (INDEPENDENT_AMBULATORY_CARE_PROVIDER_SITE_OTHER)

## 2024-01-20 ENCOUNTER — Ambulatory Visit (INDEPENDENT_AMBULATORY_CARE_PROVIDER_SITE_OTHER): Admitting: Vascular Surgery
